# Patient Record
Sex: Female | Born: 1945
Health system: Southern US, Community
[De-identification: ages and names within clinical notes are randomized; demographics above are authoritative.]

## PROBLEM LIST (undated history)

## (undated) DIAGNOSIS — I10 Essential (primary) hypertension: Secondary | ICD-10-CM

## (undated) DIAGNOSIS — N186 End stage renal disease: Secondary | ICD-10-CM

## (undated) DIAGNOSIS — K219 Gastro-esophageal reflux disease without esophagitis: Secondary | ICD-10-CM

## (undated) DIAGNOSIS — T8859XA Other complications of anesthesia, initial encounter: Secondary | ICD-10-CM

## (undated) DIAGNOSIS — E7800 Pure hypercholesterolemia, unspecified: Secondary | ICD-10-CM

## (undated) DIAGNOSIS — I441 Atrioventricular block, second degree: Secondary | ICD-10-CM

## (undated) DIAGNOSIS — T4145XA Adverse effect of unspecified anesthetic, initial encounter: Secondary | ICD-10-CM

## (undated) DIAGNOSIS — E039 Hypothyroidism, unspecified: Secondary | ICD-10-CM

## (undated) DIAGNOSIS — Z950 Presence of cardiac pacemaker: Secondary | ICD-10-CM

## (undated) DIAGNOSIS — E119 Type 2 diabetes mellitus without complications: Secondary | ICD-10-CM

## (undated) DIAGNOSIS — R011 Cardiac murmur, unspecified: Secondary | ICD-10-CM

## (undated) DIAGNOSIS — M1990 Unspecified osteoarthritis, unspecified site: Secondary | ICD-10-CM

## (undated) DIAGNOSIS — D509 Iron deficiency anemia, unspecified: Secondary | ICD-10-CM

## (undated) DIAGNOSIS — G43909 Migraine, unspecified, not intractable, without status migrainosus: Secondary | ICD-10-CM

## (undated) DIAGNOSIS — N184 Chronic kidney disease, stage 4 (severe): Secondary | ICD-10-CM

## (undated) DIAGNOSIS — M109 Gout, unspecified: Secondary | ICD-10-CM

## (undated) DIAGNOSIS — Z45018 Encounter for adjustment and management of other part of cardiac pacemaker: Secondary | ICD-10-CM

## (undated) DIAGNOSIS — Z992 Dependence on renal dialysis: Secondary | ICD-10-CM

## (undated) HISTORY — PX: APPENDECTOMY: SHX54

## (undated) HISTORY — PX: KNEE ARTHROSCOPY: SHX127

## (undated) HISTORY — PX: TONSILLECTOMY: SUR1361

---
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
## 2016-07-16 DIAGNOSIS — M25531 Pain in right wrist: Secondary | ICD-10-CM | POA: Insufficient documentation

## 2016-07-16 DIAGNOSIS — M25562 Pain in left knee: Secondary | ICD-10-CM | POA: Insufficient documentation

## 2017-01-03 ENCOUNTER — Other Ambulatory Visit: Payer: Self-pay | Admitting: Internal Medicine

## 2017-01-03 DIAGNOSIS — N184 Chronic kidney disease, stage 4 (severe): Secondary | ICD-10-CM

## 2017-01-06 ENCOUNTER — Ambulatory Visit
Admission: RE | Admit: 2017-01-06 | Discharge: 2017-01-06 | Disposition: A | Discharge status: Home / Self Care | Payer: Medicare Other | Source: Ambulatory Visit | Attending: Internal Medicine | Admitting: Internal Medicine

## 2017-01-06 DIAGNOSIS — N184 Chronic kidney disease, stage 4 (severe): Secondary | ICD-10-CM

## 2017-05-28 ENCOUNTER — Other Ambulatory Visit (HOSPITAL_COMMUNITY): Payer: Self-pay | Admitting: *Deleted

## 2017-05-28 DIAGNOSIS — N184 Chronic kidney disease, stage 4 (severe): Secondary | ICD-10-CM

## 2017-05-29 ENCOUNTER — Ambulatory Visit (HOSPITAL_COMMUNITY)
Admission: RE | Admit: 2017-05-29 | Discharge: 2017-05-29 | Disposition: A | Discharge status: Home / Self Care | Payer: Medicare Other | Source: Ambulatory Visit | Attending: Nephrology | Admitting: Nephrology

## 2017-05-29 DIAGNOSIS — D631 Anemia in chronic kidney disease: Secondary | ICD-10-CM | POA: Diagnosis not present

## 2017-05-29 DIAGNOSIS — N184 Chronic kidney disease, stage 4 (severe): Secondary | ICD-10-CM | POA: Diagnosis not present

## 2017-05-29 DIAGNOSIS — Z5181 Encounter for therapeutic drug level monitoring: Secondary | ICD-10-CM | POA: Insufficient documentation

## 2017-05-29 DIAGNOSIS — Z79899 Other long term (current) drug therapy: Secondary | ICD-10-CM | POA: Insufficient documentation

## 2017-05-29 LAB — POCT HEMOGLOBIN-HEMACUE: Hemoglobin: 9.5 g/dL — ABNORMAL LOW (ref 12.0–15.0)

## 2017-05-29 MED ORDER — EPOETIN ALFA 20000 UNIT/ML IJ SOLN
INTRAMUSCULAR | Status: AC
  Administered 2017-05-29: 20000 [IU] via SUBCUTANEOUS
  Filled 2017-05-29: qty 1

## 2017-05-29 MED ORDER — EPOETIN ALFA 10000 UNIT/ML IJ SOLN
INTRAMUSCULAR | Status: AC
  Administered 2017-05-29: 10000 [IU] via SUBCUTANEOUS
  Filled 2017-05-29: qty 1

## 2017-05-29 MED ORDER — EPOETIN ALFA 10000 UNIT/ML IJ SOLN
30000.0000 [IU] | INTRAMUSCULAR | Status: DC
  Administered 2017-05-29: 10000 [IU] via SUBCUTANEOUS

## 2017-05-29 MED ORDER — FERUMOXYTOL INJECTION 510 MG/17 ML
510.0000 mg | INTRAVENOUS | Status: DC
  Administered 2017-05-29: 510 mg via INTRAVENOUS
  Filled 2017-05-29: qty 17

## 2017-05-29 NOTE — Discharge Instructions (Signed)
Ferumoxytol injection °What is this medicine? °FERUMOXYTOL is an iron complex. Iron is used to make healthy red blood cells, which carry oxygen and nutrients throughout the body. This medicine is used to treat iron deficiency anemia in people with chronic kidney disease. °This medicine may be used for other purposes; ask your health care provider or pharmacist if you have questions. °COMMON BRAND NAME(S): Feraheme °What should I tell my health care provider before I take this medicine? °They need to know if you have any of these conditions: °-anemia not caused by low iron levels °-high levels of iron in the blood °-magnetic resonance imaging (MRI) test scheduled °-an unusual or allergic reaction to iron, other medicines, foods, dyes, or preservatives °-pregnant or trying to get pregnant °-breast-feeding °How should I use this medicine? °This medicine is for injection into a vein. It is given by a health care professional in a hospital or clinic setting. °Talk to your pediatrician regarding the use of this medicine in children. Special care may be needed. °Overdosage: If you think you have taken too much of this medicine contact a poison control center or emergency room at once. °NOTE: This medicine is only for you. Do not share this medicine with others. °What if I miss a dose? °It is important not to miss your dose. Call your doctor or health care professional if you are unable to keep an appointment. °What may interact with this medicine? °This medicine may interact with the following medications: °-other iron products °This list may not describe all possible interactions. Give your health care provider a list of all the medicines, herbs, non-prescription drugs, or dietary supplements you use. Also tell them if you smoke, drink alcohol, or use illegal drugs. Some items may interact with your medicine. °What should I watch for while using this medicine? °Visit your doctor or healthcare professional regularly. Tell  your doctor or healthcare professional if your symptoms do not start to get better or if they get worse. You may need blood work done while you are taking this medicine. °You may need to follow a special diet. Talk to your doctor. Foods that contain iron include: whole grains/cereals, dried fruits, beans, or peas, leafy green vegetables, and organ meats (liver, kidney). °What side effects may I notice from receiving this medicine? °Side effects that you should report to your doctor or health care professional as soon as possible: °-allergic reactions like skin rash, itching or hives, swelling of the face, lips, or tongue °-breathing problems °-changes in blood pressure °-feeling faint or lightheaded, falls °-fever or chills °-flushing, sweating, or hot feelings °-swelling of the ankles or feet °Side effects that usually do not require medical attention (report to your doctor or health care professional if they continue or are bothersome): °-diarrhea °-headache °-nausea, vomiting °-stomach pain °This list may not describe all possible side effects. Call your doctor for medical advice about side effects. You may report side effects to FDA at 1-800-FDA-1088. °Where should I keep my medicine? °This drug is given in a hospital or clinic and will not be stored at home. °NOTE: This sheet is a summary. It may not cover all possible information. If you have questions about this medicine, talk to your doctor, pharmacist, or health care provider. °© 2018 [employer] (2015-12-21 12:41:49) ° ° °Epoetin Alfa injection °What is this medicine? °EPOETIN ALFA (e POE e tin AL fa) helps your body make more red blood cells. This medicine is used to treat anemia caused by chronic kidney   failure, cancer chemotherapy, or HIV-therapy. It may also be used before surgery if you have anemia. °This medicine may be used for other purposes; ask your health care provider or pharmacist if you have questions. °COMMON BRAND NAME(S):  Epogen, Procrit °What should I tell my health care provider before I take this medicine? °They need to know if you have any of these conditions: °-blood clotting disorders °-cancer patient not on chemotherapy °-cystic fibrosis °-heart disease, such as angina or heart failure °-hemoglobin level of 12 g/dL or greater °-high blood pressure °-low levels of folate, iron, or vitamin B12 °-seizures °-an unusual or allergic reaction to erythropoietin, albumin, benzyl alcohol, hamster proteins, other medicines, foods, dyes, or preservatives °-pregnant or trying to get pregnant °-breast-feeding °How should I use this medicine? °This medicine is for injection into a vein or under the skin. It is usually given by a health care professional in a hospital or clinic setting. °If you get this medicine at home, you will be taught how to prepare and give this medicine. Use exactly as directed. Take your medicine at regular intervals. Do not take your medicine more often than directed. °It is important that you put your used needles and syringes in a special sharps container. Do not put them in a trash can. If you do not have a sharps container, call your pharmacist or healthcare provider to get one. °A special MedGuide will be given to you by the pharmacist with each prescription and refill. Be sure to read this information carefully each time. °Talk to your pediatrician regarding the use of this medicine in children. While this drug may be prescribed for selected conditions, precautions do apply. °Overdosage: If you think you have taken too much of this medicine contact a poison control center or emergency room at once. °NOTE: This medicine is only for you. Do not share this medicine with others. °What if I miss a dose? °If you miss a dose, take it as soon as you can. If it is almost time for your next dose, take only that dose. Do not take double or extra doses. °What may interact with this medicine? °Do not take this medicine with  any of the following medications: °-darbepoetin alfa °This list may not describe all possible interactions. Give your health care provider a list of all the medicines, herbs, non-prescription drugs, or dietary supplements you use. Also tell them if you smoke, drink alcohol, or use illegal drugs. Some items may interact with your medicine. °What should I watch for while using this medicine? °Your condition will be monitored carefully while you are receiving this medicine. °You may need blood work done while you are taking this medicine. °What side effects may I notice from receiving this medicine? °Side effects that you should report to your doctor or health care professional as soon as possible: °-allergic reactions like skin rash, itching or hives, swelling of the face, lips, or tongue °-breathing problems °-changes in vision °-chest pain °-confusion, trouble speaking or understanding °-feeling faint or lightheaded, falls °-high blood pressure °-muscle aches or pains °-pain, swelling, warmth in the leg °-rapid weight gain °-severe headaches °-sudden numbness or weakness of the face, arm or leg °-trouble walking, dizziness, loss of balance or coordination °-seizures (convulsions) °-swelling of the ankles, feet, hands °-unusually weak or tired °Side effects that usually do not require medical attention (report to your doctor or health care professional if they continue or are bothersome): °-diarrhea °-fever, chills (flu-like symptoms) °-headaches °-nausea, vomiting °-redness, stinging,   or swelling at site where injected °This list may not describe all possible side effects. Call your doctor for medical advice about side effects. You may report side effects to FDA at 1-800-FDA-1088. °Where should I keep my medicine? °Keep out of the reach of children. °Store in a refrigerator between 2 and 8 degrees C (36 and 46 degrees F). Do not freeze or shake. Throw away any unused portion if using a single-dose vial. Multi-dose  vials can be kept in the refrigerator for up to 21 days after the initial dose. Throw away unused medicine. °NOTE: This sheet is a summary. It may not cover all possible information. If you have questions about this medicine, talk to your doctor, pharmacist, or health care provider. °© 2018 [employer] (2016-07-08 19:42:31) ° °

## 2017-06-05 ENCOUNTER — Encounter (HOSPITAL_COMMUNITY): Payer: Medicare Other

## 2017-06-25 ENCOUNTER — Other Ambulatory Visit (HOSPITAL_COMMUNITY): Payer: Self-pay | Admitting: *Deleted

## 2017-06-26 ENCOUNTER — Ambulatory Visit (HOSPITAL_COMMUNITY)
Admission: RE | Admit: 2017-06-26 | Discharge: 2017-06-26 | Disposition: A | Discharge status: Home / Self Care | Payer: Medicare Other | Source: Ambulatory Visit | Attending: Nephrology | Admitting: Nephrology

## 2017-06-26 DIAGNOSIS — D631 Anemia in chronic kidney disease: Secondary | ICD-10-CM | POA: Diagnosis not present

## 2017-06-26 DIAGNOSIS — N184 Chronic kidney disease, stage 4 (severe): Secondary | ICD-10-CM | POA: Diagnosis not present

## 2017-06-26 LAB — POCT HEMOGLOBIN-HEMACUE: HEMOGLOBIN: 10.7 g/dL — AB (ref 12.0–15.0)

## 2017-06-26 MED ORDER — SODIUM CHLORIDE 0.9 % IV SOLN
510.0000 mg | INTRAVENOUS | Status: AC
  Administered 2017-06-26: 510 mg via INTRAVENOUS
  Filled 2017-06-26: qty 17

## 2017-06-26 MED ORDER — EPOETIN ALFA 10000 UNIT/ML IJ SOLN: 30000.0000 [IU] | INTRAMUSCULAR | Status: DC

## 2017-06-26 MED ORDER — EPOETIN ALFA 10000 UNIT/ML IJ SOLN
INTRAMUSCULAR | Status: AC
  Administered 2017-06-26: 10000 [IU] via SUBCUTANEOUS
  Filled 2017-06-26: qty 1

## 2017-06-26 MED ORDER — EPOETIN ALFA 20000 UNIT/ML IJ SOLN
INTRAMUSCULAR | Status: AC
  Administered 2017-06-26: 20000 [IU] via SUBCUTANEOUS
  Filled 2017-06-26: qty 1

## 2017-07-24 ENCOUNTER — Encounter (HOSPITAL_COMMUNITY)
Admission: RE | Admit: 2017-07-24 | Discharge: 2017-07-24 | Disposition: A | Discharge status: Home / Self Care | Payer: Medicare Other | Source: Ambulatory Visit | Attending: Nephrology | Admitting: Nephrology

## 2017-07-24 DIAGNOSIS — D631 Anemia in chronic kidney disease: Secondary | ICD-10-CM | POA: Diagnosis present

## 2017-07-24 DIAGNOSIS — N189 Chronic kidney disease, unspecified: Secondary | ICD-10-CM | POA: Diagnosis not present

## 2017-07-24 LAB — IRON AND TIBC
Iron: 69 ug/dL (ref 28–170)
SATURATION RATIOS: 21 % (ref 10.4–31.8)
TIBC: 325 ug/dL (ref 250–450)
UIBC: 256 ug/dL

## 2017-07-24 LAB — FERRITIN: FERRITIN: 89 ng/mL (ref 11–307)

## 2017-07-24 LAB — POCT HEMOGLOBIN-HEMACUE: HEMOGLOBIN: 10.6 g/dL — AB (ref 12.0–15.0)

## 2017-07-24 MED ORDER — EPOETIN ALFA 40000 UNIT/ML IJ SOLN: 30000.0000 [IU] | Freq: Once | INTRAMUSCULAR | Status: DC

## 2017-07-24 MED ORDER — EPOETIN ALFA 20000 UNIT/ML IJ SOLN
INTRAMUSCULAR | Status: AC
  Administered 2017-07-24: 20000 [IU] via SUBCUTANEOUS
  Filled 2017-07-24: qty 1

## 2017-07-24 MED ORDER — EPOETIN ALFA 10000 UNIT/ML IJ SOLN
INTRAMUSCULAR | Status: AC
  Administered 2017-07-24: 10000 [IU] via SUBCUTANEOUS
  Filled 2017-07-24: qty 1

## 2017-08-20 ENCOUNTER — Other Ambulatory Visit (HOSPITAL_COMMUNITY): Payer: Self-pay | Admitting: *Deleted

## 2017-08-21 ENCOUNTER — Encounter (HOSPITAL_COMMUNITY)
Admission: RE | Admit: 2017-08-21 | Discharge: 2017-08-21 | Disposition: A | Discharge status: Home / Self Care | Payer: Medicare Other | Source: Ambulatory Visit | Attending: Nephrology | Admitting: Nephrology

## 2017-08-21 DIAGNOSIS — D631 Anemia in chronic kidney disease: Secondary | ICD-10-CM | POA: Diagnosis present

## 2017-08-21 DIAGNOSIS — N189 Chronic kidney disease, unspecified: Secondary | ICD-10-CM | POA: Insufficient documentation

## 2017-08-21 LAB — IRON AND TIBC
IRON: 75 ug/dL (ref 28–170)
Saturation Ratios: 22 % (ref 10.4–31.8)
TIBC: 343 ug/dL (ref 250–450)
UIBC: 268 ug/dL

## 2017-08-21 LAB — POCT HEMOGLOBIN-HEMACUE: HEMOGLOBIN: 12.3 g/dL (ref 12.0–15.0)

## 2017-08-21 LAB — FERRITIN: Ferritin: 58 ng/mL (ref 11–307)

## 2017-08-21 MED ORDER — EPOETIN ALFA 20000 UNIT/ML IJ SOLN: 30000.0000 [IU] | Freq: Once | INTRAMUSCULAR | Status: DC

## 2017-09-04 ENCOUNTER — Other Ambulatory Visit (HOSPITAL_COMMUNITY): Payer: Self-pay | Admitting: *Deleted

## 2017-09-05 ENCOUNTER — Encounter (HOSPITAL_COMMUNITY)
Admission: RE | Admit: 2017-09-05 | Discharge: 2017-09-05 | Disposition: A | Discharge status: Home / Self Care | Payer: Medicare Other | Source: Ambulatory Visit | Attending: Nephrology | Admitting: Nephrology

## 2017-09-05 DIAGNOSIS — D631 Anemia in chronic kidney disease: Secondary | ICD-10-CM | POA: Insufficient documentation

## 2017-09-05 DIAGNOSIS — N189 Chronic kidney disease, unspecified: Secondary | ICD-10-CM | POA: Insufficient documentation

## 2017-09-05 MED ORDER — EPOETIN ALFA 10000 UNIT/ML IJ SOLN
10000.0000 [IU] | INTRAMUSCULAR | Status: DC
  Administered 2017-09-05: 10000 [IU] via SUBCUTANEOUS

## 2017-09-05 MED ORDER — EPOETIN ALFA 10000 UNIT/ML IJ SOLN
INTRAMUSCULAR | Status: AC
  Filled 2017-09-05: qty 1

## 2017-09-09 LAB — POCT HEMOGLOBIN-HEMACUE: Hemoglobin: 11.6 g/dL — ABNORMAL LOW (ref 12.0–15.0)

## 2017-10-02 ENCOUNTER — Other Ambulatory Visit (HOSPITAL_COMMUNITY): Payer: Self-pay | Admitting: *Deleted

## 2017-10-02 DIAGNOSIS — N189 Chronic kidney disease, unspecified: Secondary | ICD-10-CM

## 2017-10-03 ENCOUNTER — Encounter (HOSPITAL_COMMUNITY)
Admission: RE | Admit: 2017-10-03 | Discharge: 2017-10-03 | Disposition: A | Discharge status: Home / Self Care | Payer: Medicare Other | Source: Ambulatory Visit | Attending: Nephrology | Admitting: Nephrology

## 2017-10-03 DIAGNOSIS — N189 Chronic kidney disease, unspecified: Secondary | ICD-10-CM | POA: Diagnosis not present

## 2017-10-03 DIAGNOSIS — D631 Anemia in chronic kidney disease: Secondary | ICD-10-CM | POA: Insufficient documentation

## 2017-10-03 LAB — POCT HEMOGLOBIN-HEMACUE: Hemoglobin: 10.7 g/dL — ABNORMAL LOW (ref 12.0–15.0)

## 2017-10-03 LAB — IRON AND TIBC
Iron: 58 ug/dL (ref 28–170)
Saturation Ratios: 18 % (ref 10.4–31.8)
TIBC: 325 ug/dL (ref 250–450)
UIBC: 267 ug/dL

## 2017-10-03 MED ORDER — EPOETIN ALFA 10000 UNIT/ML IJ SOLN
INTRAMUSCULAR | Status: AC
  Administered 2017-10-03: 10000 [IU]
  Filled 2017-10-03: qty 1

## 2017-10-03 MED ORDER — EPOETIN ALFA 20000 UNIT/ML IJ SOLN: 10000.0000 [IU] | Freq: Once | INTRAMUSCULAR | Status: DC

## 2017-10-30 ENCOUNTER — Other Ambulatory Visit (HOSPITAL_COMMUNITY): Payer: Self-pay | Admitting: *Deleted

## 2017-10-31 ENCOUNTER — Ambulatory Visit (HOSPITAL_COMMUNITY)
Admission: RE | Admit: 2017-10-31 | Discharge: 2017-10-31 | Disposition: A | Discharge status: Home / Self Care | Payer: Medicare Other | Source: Ambulatory Visit | Attending: Nephrology | Admitting: Nephrology

## 2017-10-31 DIAGNOSIS — D631 Anemia in chronic kidney disease: Secondary | ICD-10-CM | POA: Diagnosis not present

## 2017-10-31 DIAGNOSIS — N184 Chronic kidney disease, stage 4 (severe): Secondary | ICD-10-CM | POA: Insufficient documentation

## 2017-10-31 LAB — RENAL FUNCTION PANEL
Albumin: 3.5 g/dL (ref 3.5–5.0)
Anion gap: 10 (ref 5–15)
BUN: 49 mg/dL — AB (ref 6–20)
CALCIUM: 9.4 mg/dL (ref 8.9–10.3)
CO2: 27 mmol/L (ref 22–32)
CREATININE: 3.05 mg/dL — AB (ref 0.44–1.00)
Chloride: 99 mmol/L — ABNORMAL LOW (ref 101–111)
GFR calc non Af Amer: 14 mL/min — ABNORMAL LOW (ref >60)
GFR, EST AFRICAN AMERICAN: 16 mL/min — AB (ref >60)
GLUCOSE: 182 mg/dL — AB (ref 65–99)
Phosphorus: 4.2 mg/dL (ref 2.5–4.6)
Potassium: 3.7 mmol/L (ref 3.5–5.1)
SODIUM: 136 mmol/L (ref 135–145)

## 2017-10-31 LAB — POCT HEMOGLOBIN-HEMACUE: Hemoglobin: 10 g/dL — ABNORMAL LOW (ref 12.0–15.0)

## 2017-10-31 MED ORDER — EPOETIN ALFA 10000 UNIT/ML IJ SOLN
INTRAMUSCULAR | Status: AC
Start: 2017-10-31 — End: 2017-10-31
  Administered 2017-10-31: 10000 [IU] via SUBCUTANEOUS
  Filled 2017-10-31: qty 1

## 2017-10-31 MED ORDER — SODIUM CHLORIDE 0.9 % IV SOLN
510.0000 mg | Freq: Once | INTRAVENOUS | Status: AC
  Administered 2017-10-31: 510 mg via INTRAVENOUS
  Filled 2017-10-31: qty 17

## 2017-10-31 MED ORDER — EPOETIN ALFA 10000 UNIT/ML IJ SOLN
10000.0000 [IU] | Freq: Once | INTRAMUSCULAR | Status: AC
  Administered 2017-10-31: 10000 [IU] via SUBCUTANEOUS

## 2017-11-01 LAB — PTH, INTACT AND CALCIUM
CALCIUM TOTAL (PTH): 9.6 mg/dL (ref 8.7–10.3)
PTH: 58 pg/mL (ref 15–65)

## 2017-11-27 ENCOUNTER — Encounter (HOSPITAL_COMMUNITY): Payer: Medicare Other

## 2018-03-02 HISTORY — PX: PACEMAKER PLACEMENT: SHX43

## 2018-03-19 DIAGNOSIS — R0609 Other forms of dyspnea: Secondary | ICD-10-CM

## 2018-03-19 DIAGNOSIS — R0600 Dyspnea, unspecified: Secondary | ICD-10-CM | POA: Diagnosis present

## 2018-03-20 ENCOUNTER — Other Ambulatory Visit: Payer: Self-pay

## 2018-03-20 ENCOUNTER — Encounter (HOSPITAL_COMMUNITY): Payer: Self-pay

## 2018-03-20 ENCOUNTER — Ambulatory Visit (HOSPITAL_COMMUNITY): Payer: Medicare Other

## 2018-03-20 ENCOUNTER — Encounter (HOSPITAL_COMMUNITY)
Admission: RE | Disposition: A | Discharge status: Home / Self Care | Payer: Self-pay | Source: Ambulatory Visit | Attending: Cardiology

## 2018-03-20 ENCOUNTER — Ambulatory Visit (HOSPITAL_COMMUNITY)
Admission: RE | Disposition: A | Discharge status: Home / Self Care | Payer: Self-pay | Source: Ambulatory Visit | Attending: Cardiology

## 2018-03-20 ENCOUNTER — Ambulatory Visit (HOSPITAL_COMMUNITY)
Admission: RE | Admit: 2018-03-20 | Discharge: 2018-03-20 | Disposition: A | Discharge status: Home / Self Care | Payer: Medicare Other | Source: Ambulatory Visit | Attending: Cardiology | Admitting: Cardiology

## 2018-03-20 DIAGNOSIS — I080 Rheumatic disorders of both mitral and aortic valves: Secondary | ICD-10-CM | POA: Diagnosis not present

## 2018-03-20 DIAGNOSIS — E782 Mixed hyperlipidemia: Secondary | ICD-10-CM | POA: Diagnosis not present

## 2018-03-20 DIAGNOSIS — R0600 Dyspnea, unspecified: Secondary | ICD-10-CM | POA: Diagnosis present

## 2018-03-20 DIAGNOSIS — R0602 Shortness of breath: Secondary | ICD-10-CM | POA: Diagnosis not present

## 2018-03-20 DIAGNOSIS — E039 Hypothyroidism, unspecified: Secondary | ICD-10-CM | POA: Insufficient documentation

## 2018-03-20 DIAGNOSIS — M8580 Other specified disorders of bone density and structure, unspecified site: Secondary | ICD-10-CM | POA: Diagnosis not present

## 2018-03-20 DIAGNOSIS — Z7982 Long term (current) use of aspirin: Secondary | ICD-10-CM | POA: Insufficient documentation

## 2018-03-20 DIAGNOSIS — I442 Atrioventricular block, complete: Secondary | ICD-10-CM | POA: Insufficient documentation

## 2018-03-20 DIAGNOSIS — R0609 Other forms of dyspnea: Secondary | ICD-10-CM | POA: Diagnosis present

## 2018-03-20 DIAGNOSIS — E1122 Type 2 diabetes mellitus with diabetic chronic kidney disease: Secondary | ICD-10-CM | POA: Insufficient documentation

## 2018-03-20 DIAGNOSIS — I129 Hypertensive chronic kidney disease with stage 1 through stage 4 chronic kidney disease, or unspecified chronic kidney disease: Secondary | ICD-10-CM | POA: Insufficient documentation

## 2018-03-20 DIAGNOSIS — N184 Chronic kidney disease, stage 4 (severe): Secondary | ICD-10-CM | POA: Insufficient documentation

## 2018-03-20 DIAGNOSIS — I6523 Occlusion and stenosis of bilateral carotid arteries: Secondary | ICD-10-CM | POA: Insufficient documentation

## 2018-03-20 DIAGNOSIS — K219 Gastro-esophageal reflux disease without esophagitis: Secondary | ICD-10-CM | POA: Insufficient documentation

## 2018-03-20 DIAGNOSIS — D631 Anemia in chronic kidney disease: Secondary | ICD-10-CM | POA: Insufficient documentation

## 2018-03-20 DIAGNOSIS — M109 Gout, unspecified: Secondary | ICD-10-CM | POA: Diagnosis not present

## 2018-03-20 DIAGNOSIS — I350 Nonrheumatic aortic (valve) stenosis: Secondary | ICD-10-CM

## 2018-03-20 DIAGNOSIS — I441 Atrioventricular block, second degree: Secondary | ICD-10-CM | POA: Diagnosis present

## 2018-03-20 DIAGNOSIS — E1151 Type 2 diabetes mellitus with diabetic peripheral angiopathy without gangrene: Secondary | ICD-10-CM | POA: Insufficient documentation

## 2018-03-20 HISTORY — PX: RIGHT HEART CATH: CATH118263

## 2018-03-20 HISTORY — PX: TEE WITHOUT CARDIOVERSION: SHX5443

## 2018-03-20 HISTORY — DX: Gout, unspecified: M10.9

## 2018-03-20 HISTORY — DX: Essential (primary) hypertension: I10

## 2018-03-20 LAB — BASIC METABOLIC PANEL
Anion gap: 18 — ABNORMAL HIGH (ref 5–15)
BUN: 83 mg/dL — AB (ref 6–20)
CHLORIDE: 96 mmol/L — AB (ref 101–111)
CO2: 24 mmol/L (ref 22–32)
Calcium: 10.4 mg/dL — ABNORMAL HIGH (ref 8.9–10.3)
Creatinine, Ser: 4.19 mg/dL — ABNORMAL HIGH (ref 0.44–1.00)
GFR calc Af Amer: 11 mL/min — ABNORMAL LOW (ref >60)
GFR calc non Af Amer: 10 mL/min — ABNORMAL LOW (ref >60)
GLUCOSE: 160 mg/dL — AB (ref 65–99)
POTASSIUM: 3.3 mmol/L — AB (ref 3.5–5.1)
Sodium: 138 mmol/L (ref 135–145)

## 2018-03-20 LAB — CBC
HEMATOCRIT: 28.9 % — AB (ref 36.0–46.0)
Hemoglobin: 9.6 g/dL — ABNORMAL LOW (ref 12.0–15.0)
MCH: 29.7 pg (ref 26.0–34.0)
MCHC: 33.2 g/dL (ref 30.0–36.0)
MCV: 89.5 fL (ref 78.0–100.0)
Platelets: 178 10*3/uL (ref 150–400)
RBC: 3.23 MIL/uL — ABNORMAL LOW (ref 3.87–5.11)
RDW: 13.8 % (ref 11.5–15.5)
WBC: 9.1 10*3/uL (ref 4.0–10.5)

## 2018-03-20 LAB — POCT I-STAT 3, VENOUS BLOOD GAS (G3P V)
Acid-Base Excess: 4 mmol/L — ABNORMAL HIGH (ref 0.0–2.0)
Bicarbonate: 28.3 mmol/L — ABNORMAL HIGH (ref 20.0–28.0)
O2 Saturation: 70 %
PH VEN: 7.468 — AB (ref 7.250–7.430)
TCO2: 30 mmol/L (ref 22–32)
pCO2, Ven: 39.1 mmHg — ABNORMAL LOW (ref 44.0–60.0)
pO2, Ven: 35 mmHg (ref 32.0–45.0)

## 2018-03-20 SURGERY — RIGHT HEART CATH
Anesthesia: LOCAL

## 2018-03-20 SURGERY — ECHOCARDIOGRAM, TRANSESOPHAGEAL
Anesthesia: Moderate Sedation

## 2018-03-20 MED ORDER — SODIUM CHLORIDE 0.9 % IV SOLN: 250.0000 mL | INTRAVENOUS | Status: DC | PRN

## 2018-03-20 MED ORDER — SODIUM CHLORIDE 0.9 % IV SOLN: INTRAVENOUS | Status: DC

## 2018-03-20 MED ORDER — FENTANYL CITRATE (PF) 100 MCG/2ML IJ SOLN
INTRAMUSCULAR | Status: DC | PRN
  Administered 2018-03-20 (×2): 25 ug via INTRAVENOUS

## 2018-03-20 MED ORDER — SODIUM CHLORIDE 0.9 % WEIGHT BASED INFUSION: 1.0000 mL/kg/h | INTRAVENOUS | Status: DC

## 2018-03-20 MED ORDER — SODIUM CHLORIDE 0.9 % IV SOLN
INTRAVENOUS | Status: DC | PRN
  Administered 2018-03-20: 100 mL/h via INTRAVENOUS

## 2018-03-20 MED ORDER — LIDOCAINE HCL (PF) 1 % IJ SOLN
INTRAMUSCULAR | Status: DC | PRN
  Administered 2018-03-20: 15 mL
  Administered 2018-03-20: 2 mL

## 2018-03-20 MED ORDER — SODIUM CHLORIDE 0.9 % WEIGHT BASED INFUSION: 3.0000 mL/kg/h | INTRAVENOUS | Status: AC

## 2018-03-20 MED ORDER — SODIUM CHLORIDE 0.9% FLUSH: 3.0000 mL | INTRAVENOUS | Status: DC | PRN

## 2018-03-20 MED ORDER — FENTANYL CITRATE (PF) 100 MCG/2ML IJ SOLN
INTRAMUSCULAR | Status: AC
  Filled 2018-03-20: qty 2

## 2018-03-20 MED ORDER — MIDAZOLAM HCL 5 MG/ML IJ SOLN
INTRAMUSCULAR | Status: AC
  Filled 2018-03-20: qty 2

## 2018-03-20 MED ORDER — ONDANSETRON HCL 4 MG/2ML IJ SOLN
4.0000 mg | Freq: Four times a day (QID) | INTRAMUSCULAR | Status: DC | PRN
Start: 2018-03-20 — End: 2018-03-20

## 2018-03-20 MED ORDER — MIDAZOLAM HCL 10 MG/2ML IJ SOLN
INTRAMUSCULAR | Status: DC | PRN
  Administered 2018-03-20 (×2): 1 mg via INTRAVENOUS

## 2018-03-20 MED ORDER — SODIUM CHLORIDE 0.9% FLUSH: 3.0000 mL | Freq: Two times a day (BID) | INTRAVENOUS | Status: DC

## 2018-03-20 MED ORDER — HEPARIN (PORCINE) IN NACL 1000-0.9 UT/500ML-% IV SOLN
INTRAVENOUS | Status: AC
Start: 2018-03-20 — End: 2018-03-20
  Filled 2018-03-20: qty 500

## 2018-03-20 MED ORDER — LIDOCAINE HCL (PF) 1 % IJ SOLN
INTRAMUSCULAR | Status: AC
Start: 2018-03-20 — End: 2018-03-20
  Filled 2018-03-20: qty 30

## 2018-03-20 MED ORDER — ASPIRIN 81 MG PO CHEW: 81.0000 mg | CHEWABLE_TABLET | ORAL | Status: DC

## 2018-03-20 MED ORDER — BUTAMBEN-TETRACAINE-BENZOCAINE 2-2-14 % EX AERO
INHALATION_SPRAY | CUTANEOUS | Status: DC | PRN
  Administered 2018-03-20: 2 via TOPICAL

## 2018-03-20 SURGICAL SUPPLY — 11 items
CATH SWAN GANZ 7F STRAIGHT (CATHETERS) ×2 IMPLANT
COVER PRB 48X5XTLSCP FOLD TPE (BAG) ×1 IMPLANT
COVER PROBE 5X48 (BAG) ×1
GLIDESHEATH SLEND A-KIT 6F 22G (SHEATH) IMPLANT
GUIDEWIRE .025 260CM (WIRE) ×2 IMPLANT
KIT HEART LEFT (KITS) ×2 IMPLANT
KIT MICROPUNCTURE NIT STIFF (SHEATH) ×2 IMPLANT
PACK CARDIAC CATHETERIZATION (CUSTOM PROCEDURE TRAY) ×2 IMPLANT
SHEATH AVANTI 11CM 7FR (SHEATH) ×2 IMPLANT
SHEATH RAIN 4/5FR (SHEATH) IMPLANT
TRANSDUCER W/STOPCOCK (MISCELLANEOUS) ×2 IMPLANT

## 2018-03-20 NOTE — Progress Notes (Signed)
Dr Shanon Brow was paged// call returned. Needed verification on order for 4 hour bedrest. Per Dr Liliane Shi, he only wants 1 hour bedrest.

## 2018-03-20 NOTE — CV Procedure (Addendum)
TEE: Under moderate sedation, TEE was performed without complications:  LV: Hyperdynamic LV. Moderate LVH. Basal septal hypertrophy. Unable to calculate peak LVOT gradient due to aliasing.  RV: Normal LA: Normal. Left atrial appendage: Normal without thrombus. Normal function. Thickened interatrial septum. Inter atrial septum is intact without defect. Calcific coumadin ridge  RA: Normal  MV: Leaflet and annular calcification without significant stenosis or regurgitation TV: Normal Trace TR AV: Annular calcification. Leaflets pliable and open well. No significant AI. Vmax 4.04 m/sec, mean PG  33 mmHg. However, significant LVOT gradient due to thicken basal septum. No SAM, no subaortic membrane. AVA by continuity equation 1.7 cm2, by planimetry >2 cm2. PV: Normal. Trace PI.  Thoracic and ascending aorta: Normal without significant plaque or atheromatous changes.  Conscious sedation protocol was followed, I personally administered conscious sedation and monitored the patient. Patient received 2 milligrams of Versed and 50 mcg fentanyl . Patient tolerated the procedure well and there was no complication from conscious sedation. Time administered was 60 min and procedure ended at 1:05 PM.  Nigel Mormon, MD [facility] Pager: [phone] Office: [phone] If no answer Cell [phone]

## 2018-03-20 NOTE — H&P (Signed)
OFFICE VISIT NOTES COPIED TO EPIC FOR DOCUMENTATION  . History of Present Illness Gwinda Maine FNP-C; 03/19/2018 12:35 PM) Patient words: fu nuc, echo, carotid, gxt; last OV 02/20/18  **pt stated that she is having sob, weakness, have to sit down evey 2 min**.  The patient is a 72 year old female who presents for a follow-up for Bradycardia. 72 y/o Caucasian female with hypertension, CKD IV, type 2 DM, hyperlipidemia, hypothyroidism, anemia, osteopenia, venous insufficiency.  Patient was seen recetnly in our clinic for incidental finding of bradycardia and was found to have 2:1 AV block. She also reproted mild shortness of breath. She underwent lexiscan MPI which was normal with no ischemia/infarct. Exerxise treadmill stress test to evaluate for chroinotropic incomptency or any third degree AV block, it did not show any worsening of AV block but she was unable to reach target heart rate. She underwent echocardiogram on 03/03/2018 moderate LVH with hyperdynamic LV systolic function. LVEF was normal. Did have severe aortic stenosis; however, was not well visualized. Also moderate pulmonary hypertension. She now presents to discuss results.  Daughter in law is present at bedside. Reports for the last few weeks she has had significant increase in shortness of breath on exertion, fatigue, and weakness.      Problem List/Past Medical Georgeanna Harrison; 03/19/2018 10:24 AM) Gout (M10.9)  Hypercholesteremia (E78.00)  Seasonal allergies (J30.2)  GERD (gastroesophageal reflux disease) (K21.9)  Controlled type 2 diabetes mellitus without complication, without long-term current use of insulin (E11.9)  Bradycardia (R00.1)  EKG 02/12/2018: Sinus rhythm at 60 bpm with 2:1 AV block, no evidence of ischemia Laboratory examination (Z01.89)  04/15/2017: Creatinine 2.6, EGFR 18/21, potassium 4.3, CMP normal. RBC 3.3, hemoglobin 9.6, 29.7, CBC otherwise normal. Cholesterol 176, triglycerides 257, HDL 31,  LDL 94. TSH 1.8. Vitamin D 40.7. Benign essential hypertension (I10)  Mixed hyperlipidemia (E78.2)  Stage 4 chronic kidney disease due to type 2 diabetes mellitus (E11.22)  Shortness of breath (R06.02)  Lexiscan myoview stress test 02/13/2018: 1. Pharmacologic stress testing was performed with intravenous administration of .4 mg of Lexiscan over a 10-15 seconds infusion. Resting hypertension 180/84 mmHg. Exercise capacity not assessed. Stress symptoms included dyspnea, dizziness. Stress EKG is non diagnostic for ischemia as it is a pharmacologic stress. 2. The overall quality of the study is excellent. There is no evidence of abnormal lung activity. Stress and rest SPECT images demonstrate homogeneous tracer distribution throughout the myocardium. Gated SPECT imaging reveals normal myocardial thickening and wall motion. The left ventricular ejection fraction was normal (67%). 3. Low risk study. Malaise and fatigue (R53.81, R53.83)  Bilateral carotid bruits (R09.89)  Carotid artery duplex 03/10/2018: Minimal stenosis in the right internal carotid artery (1-15%). Stenosis in the left internal carotid artery (16-49%). Antegrade right vertebral artery flow. Antegrade left vertebral artery flow. Follow up in one year is appropriate if clinically indicated. Intermittent second degree atrioventricular block (I44.1)  2:1 AV block Exercise Treadmill Stress Test 02/20/2018: Indication: Bradycardia, 2:1 AV block The patient exercised on stop and go Modified-Bruce protocol for 4:31 min. Patient achieved 1.91 METS and reached HR 95 bpm, which is 56% of maximum age-predicted HR. Stress test terminated due to fatigue. Exercise capacity was below average for age. HR Response to Exercise: Appropriate. BP Response to Exercise: Resting hypertension- exaggerated response. Chest Pain: none. Arrhythmias: Inermittent 2:1 AV block, frequent PAC's and PVC's, episodes of atrial and ventricular bigeminy. While interpretation is  significantly limited due to baseline artifact, no evidence of high grade AV block.  Stress EKG is nondiagnostic for ischemia due to inabiliy to reach target heart rate and significant baseline artifact. Overall Impression: Poor exercise capacity. Inadequate assessment for inotropic competense, limited due to poor functional capacity. Recoomendation: Consider Holter/event monitor if high clinical suspicion for high grade AV block . Continue primary/secondary prevention. Murmur (R01.1)  Echocardiogram 03/10/2018: Left ventricle cavity is normal in size. Moderate concentric hypertrophy of the left ventricle. Hyperdynamic LV systolic function. Intraventricular pressure gradient suspected. Visual EF is 65-70%. Diastolic evaluation limited due to mitral annual calcification and mitral regurgitation. Left atrial cavity is mildly dilated. Right ventricle cavity is normal in size. Mild concentric hypertrophy of the right ventricle. Normal right ventricular function. Elevated LVOT/AoV velocity and gradients. Vmax 5 m.sec, mean PG 50 mmHg. AVA by continuity equation is 1 cm2, suggesting severe aortic stenosis. However, aortic valve is not well visualized. LVOT obstruction or SAM contributing to elevated velocity and gradients cannot be excluded. Recommend further imaging such as transesophageal echocardiography. Moderate calcification of the mitral valve annulus. Moderately restricted mitral valve leaflets without significant stenosis. Mild (Grade I) mitral regurgitation. Mild to moderate tricuspid regurgitation. Moderate pulmonary hypertension. Estimated pulmonary artery systolic pressure 45 mmHg.  Allergies Georgeanna Harrison; 04-04-18 10:24 AM) No Known Drug Allergies [02/12/2018]:  Family History Georgeanna Harrison; Apr 04, 2018 10:24 AM) Mother  Deceased. at age 35 from Rectal Cancer; Hx of CVA Father  Deceased. at age 18 from an MI, DM, Diabetic Coma (1st one) Brother 2  Older; Heart Murmur  Social History  Georgeanna Harrison; 04/04/18 10:24 AM) Current tobacco use  Never smoker. Non Drinker/No Alcohol Use  Marital status  Widowed. Living Situation  Lives alone. Number of Children  3.  Past Surgical History Georgeanna Harrison; 2018/04/04 10:24 AM) Appendectomy [8032]: Tonsillectomy [1966]: Arthroscopic Knee Surgery - Both [1995]:  Medication History Gwinda Maine, FNP-C; 04/04/2018 12:45 PM) AmLODIPine Besylate (10MG Tablet, 1 (one) Tablet Oral once daily, Taken starting 02/20/2018) Active. HydrALAZINE HCl (50MG Tablet, 1 (one) Tablet Tablet Oral three times daily, Taken starting 02/12/2018) Active. Furosemide (80MG Tablet, 1 Oral two times daily) Active. Allopurinol (300MG Tablet, 1 Oral daily) Active. Simvastatin (40MG Tablet, 1 Oral daily) Active. Levothyroxine Sodium (75MCG Tablet, 1 Oral daily) Active. Zantac 150 Maximum Strength (150MG Tablet, 1 Oral daily) Active. Aspirin Adult Low Strength (81MG Tablet DR, 1 Oral daily) Active. Vitamin B-6 (100MG Tablet, 1 Oral daily) Active. Vitamin B-12 (5000MCG Tablet Disint, 1 Oral daily) Active. ZyrTEC (10MG Tablet, 1 Oral daily) Active. Vitamin D3 (2000UNIT Tablet, 1 Oral daily) Active. Calcium (600MG Tablet, 1 Oral daily) Active. Biotin (10MG Tablet, 1 Oral daily) Active. Fluticasone Propionate (50MCG/ACT Suspension, 2 sprays each nostril Nasal daily) Active. Medications Reconciled (Verbally; Pt brought list)  Diagnostic Studies History Georgeanna Harrison; Apr 04, 2018 8:58 AM) Treadmill stress test  Exercise Treadmill Stress Test 02/20/2018:  Indication: Bradycardia, 2:1 AV block  The patient exercised on stop and go Modified-Bruce protocol for 4:31 min. Patient achieved 1.91 METS and reached HR 95 bpm, which is 56% of maximum age-predicted HR. Stress test terminated due to fatigue.  Exercise capacity was below average for age. HR Response to Exercise: Appropriate.  BP Response to Exercise: Resting hypertension-  exaggerated response. Chest Pain: none. Arrhythmias: Inermittent 2:1 AV block, frequent PAC's and PVC's, episodes of atrial and ventricular bigeminy. While interpretation is significantly limited due to baseline artifact, no evidence of high grade AV block. Stress EKG is nondiagnostic for ischemia due to inabiliy to reach target heart rate and significant baseline artifact.  Overall Impression: Poor  exercise capacity. Inadequate assessment for inotropic competense, limited due to poor functional capacity.  Recoomendation: Consider Holter/event monitor if high clinical suspicion for high grade AV block . Continue primary/secondary prevention. None [03/19/2018]: (Marked as Inactive) Vascular Ultrasound  Carotid artery duplex 03/10/2018: Minimal stenosis in the right internal carotid artery (1-15%). Stenosis in the left internal carotid artery (16-49%). Antegrade right vertebral artery flow. Antegrade left vertebral artery flow. Follow up in one year is appropriate if clinically indicated. Echocardiogram  Echocardiogram 03/10/2018: Left ventricle cavity is normal in size. Moderate concentric hypertrophy of the left ventricle. Hyperdynamic LV systolic function. Intraventricular pressure gradient suspected. Visual EF is 65-70%. Diastolic evaluation limited due to mitral annual calcification and mitral regurgitation. Left atrial cavity is mildly dilated. Right ventricle cavity is normal in size. Mild concentric hypertrophy of the right ventricle. Normal right ventricular function. Elevated LVOT/AoV velocity and gradients. Vmax 5 m.sec, mean PG 50 mmHg. AVA by continuity equation is 1 cm2, suggesting severe aortic stenosis. However, aortic valve is not well visualized. LVOT obstruction or SAM contributing to elevated velocity and gradients cannot be excluded. Recommend further imaging such as transesophageal echocardiography. Moderate calcification of the mitral valve annulus. Moderately restricted  mitral valve leaflets without significant stenosis. Mild (Grade I) mitral regurgitation. Mild to moderate tricuspid regurgitation. Moderate pulmonary hypertension. Estimated pulmonary artery systolic pressure 45 mmHg. Nuclear stress test  Lexiscan myoview stress test 02/13/2018: 1. Pharmacologic stress testing was performed with intravenous administration of .4 mg of Lexiscan over a 10-15 seconds infusion. Resting hypertension 180/84 mmHg. Exercise capacity not assessed. Stress symptoms included dyspnea, dizziness. Stress EKG is non diagnostic for ischemia as it is a pharmacologic stress. 2. The overall quality of the study is excellent. There is no evidence of abnormal lung activity. Stress and rest SPECT images demonstrate homogeneous tracer distribution throughout the myocardium. Gated SPECT imaging reveals normal myocardial thickening and wall motion. The left ventricular ejection fraction was normal (67%). 3. Low risk study.    Review of Systems Gwinda Maine FNP-C; 03/19/2018 12:36 PM) General Present- Fatigue (recent). Not Present- Appetite Loss and Weight Gain. Respiratory Not Present- Chronic Cough and Wakes up from Sleep Wheezing or Short of Breath. Cardiovascular Present- Difficulty Breathing On Exertion (worsening) and Edema. Not Present- Chest Pain and Difficulty Breathing Lying Down. Gastrointestinal Not Present- Black, Tarry Stool and Difficulty Swallowing. Musculoskeletal Not Present- Decreased Range of Motion and Muscle Atrophy. Neurological Not Present- Attention Deficit, Dizziness and Syncope. Psychiatric Not Present- Personality Changes and Suicidal Ideation. Endocrine Not Present- Cold Intolerance and Heat Intolerance. Hematology Not Present- Abnormal Bleeding. All other systems negative  Vitals Georgeanna Harrison; 03/19/2018 10:36 AM) 03/19/2018 10:29 AM Weight: 208.5 lb Height: 65in Body Surface Area: 2.01 m Body Mass Index: 34.7 kg/m  Pulse: 46 (Regular)   P.OX: 98% (Room air) BP: 154/52 (Sitting, Left Arm, Standard)       Physical Exam Gwinda Maine FNP-C; 03/19/2018 12:37 PM) General Mental Status-Alert. General Appearance-Cooperative and Appears stated age. Build & Nutrition-Moderately built.  Head and Neck Thyroid Gland Characteristics - normal size and consistency and no palpable nodules.  Chest and Lung Exam Chest and lung exam reveals -quiet, even and easy respiratory effort with no use of accessory muscles, non-tender and on auscultation, normal breath sounds, no adventitious sounds.  Cardiovascular Cardiovascular examination reveals -carotid auscultation reveals no bruits, abdominal aorta auscultation reveals no bruits and no prominent pulsation, femoral artery auscultation bilaterally reveals normal pulses, no bruits, no thrills and normal pedal pulses bilaterally. Auscultation Heart  Sounds - S2 Diminished intensity. Murmurs & Other Heart Sounds: Murmur - Location - Aortic Area. Grade - II/VI. Character - Crescendo.  Abdomen Palpation/Percussion Normal exam - Non Tender and No hepatosplenomegaly.  Peripheral Vascular Carotid arteries - Bilateral-Carotid bruit.  Neurologic Neurologic evaluation reveals -alert and oriented x 3 with no impairment of recent or remote memory. Motor-Grossly intact without any focal deficits.  Musculoskeletal Global Assessment Left Lower Extremity - no deformities, masses or tenderness, no known fractures. Right Lower Extremity - no deformities, masses or tenderness, no known fractures.    Assessment & Plan Gwinda Maine FNP-C; 03/19/2018 12:44 PM) Aortic stenosis, severe (I35.0) Story: Echocardiogram 03/10/2018: Left ventricle cavity is normal in size. Moderate concentric hypertrophy of the left ventricle. Hyperdynamic LV systolic function. Intraventricular pressure gradient suspected. Visual EF is 65-70%. Diastolic evaluation limited due to mitral annual  calcification and mitral regurgitation. Left atrial cavity is mildly dilated. Right ventricle cavity is normal in size. Mild concentric hypertrophy of the right ventricle. Normal right ventricular function. Elevated LVOT/AoV velocity and gradients. Vmax 5 m.sec, mean PG 50 mmHg. AVA by continuity equation is 1 cm2, suggesting severe aortic stenosis. However, aortic valve is not well visualized. LVOT obstruction or SAM contributing to elevated velocity and gradients cannot be excluded. Recommend further imaging such as transesophageal echocardiography. Moderate calcification of the mitral valve annulus. Moderately restricted mitral valve leaflets without significant stenosis. Mild (Grade I) mitral regurgitation. Mild to moderate tricuspid regurgitation. Moderate pulmonary hypertension. Estimated pulmonary artery systolic pressure 45 mmHg. Current Plans METABOLIC PANEL, BASIC (40814) CBC & PLATELETS (AUTO) (48185) PT (PROTHROMBIN TIME) (63149) Bradycardia (R00.1) Story: EKG 02/12/2018: Sinus rhythm at 60 bpm with 2:1 AV block, no evidence of ischemia Benign essential hypertension (I10) Shortness of breath (R06.02) Story: Lexiscan myoview stress test 02/13/2018: 1. Pharmacologic stress testing was performed with intravenous administration of .4 mg of Lexiscan over a 10-15 seconds infusion. Resting hypertension 180/84 mmHg. Exercise capacity not assessed. Stress symptoms included dyspnea, dizziness. Stress EKG is non diagnostic for ischemia as it is a pharmacologic stress. 2. The overall quality of the study is excellent. There is no evidence of abnormal lung activity. Stress and rest SPECT images demonstrate homogeneous tracer distribution throughout the myocardium. Gated SPECT imaging reveals normal myocardial thickening and wall motion. The left ventricular ejection fraction was normal (67%). 3. Low risk study. Malaise and fatigue (R53.81) Bilateral carotid bruits (R09.89) Story: Carotid artery  duplex 03/10/2018: Minimal stenosis in the right internal carotid artery (1-15%). Stenosis in the left internal carotid artery (16-49%). Antegrade right vertebral artery flow. Antegrade left vertebral artery flow. Follow up in one year is appropriate if clinically indicated. Laboratory examination (Z01.89) Story: 04/15/2017: Creatinine 2.6, EGFR 18/21, potassium 4.3, CMP normal. RBC 3.3, hemoglobin 9.6, 29.7, CBC otherwise normal. Cholesterol 176, triglycerides 257, HDL 31, LDL 94. TSH 1.8. Vitamin D 40.7.  Note:Recommendations:  72 y/o Caucasian female with hypertension, CKD IV, type 2 DM, hyperlipidemia, hypothyroidism, anemia, osteopenia, venous insufficiency, being evaluated for 2:1 AV block and exertional dyspnea.  I have discussed test results with the patient and her daughter-in-law. She's had recent worsening in symptoms and it is difficult to differentiate related to her AV block or aortic stenosis and may also be multifactorial. She will need pacemaker at some point to she was unable to achieve max heart rate by stress test. No evidence of ischemia by nuclear stress testing. Would recommend further evaluation of her aortic valve first as if she needs aortic valve procedure, pacemaker may also  be placed at that time. We'll scheduled for TEE to better evaluate her aortic valve as it was not well visualized by transthoracic echo. I will go ahead and schedule for right and left heart catheterization for diagnostic incase her aortic valve is severely stenosed she will need coronary evaluation prior to undergoing procedure. We'll try to have procedures arranged for tomorrow. Schedule for TEE to better evaluate the structural abnormality. I have explained risks, benefits,alternatives to TEE, including but not limited to rare esophageal perforation, bleeding, aspiration pneumonia. Schedule for cardiac catheterization, and possible angioplasty. We discussed regarding risks and benefits. Patient  wants to proceed. Understands <1-2% risk of death, stroke, MI, urgent CABG, bleeding, infection, renal failure but not limited to these. We will see her back after the procedures for reevaluation and further coordination of care.  Has mild carotid disease. Will continue to follow annually.  *I have discussed this case with Dr. Einar Gip and he personally examined the patient and participated in formulating the plan.*  CC: [name], MD CC: Erling Cruz, MD  Signed by Gwinda Maine, FNP-C (03/19/2018 12:45 PM)

## 2018-03-20 NOTE — Discharge Instructions (Signed)
Venogram, Care After °This sheet gives you information about how to care for yourself after your procedure. Your health care provider may also give you more specific instructions. If you have problems or questions, contact your health care provider. °What can I expect after the procedure? °After the procedure, it is common to have: °· Bruising or mild discomfort in the area where the IV was inserted (insertion site). ° °Follow these instructions at home: °Eating and drinking °· Follow instructions from your health care provider about eating or drinking restrictions. °· Drink a lot of fluids for the first several days after the procedure, as directed by your health care provider. This helps to wash (flush) the contrast out of your body. Examples of healthy fluids include water or low-calorie drinks. °General instructions °· Check your IV insertion area every day for signs of infection. Check for: °? Redness, swelling, or pain. °? Fluid or blood. °? Warmth. °? Pus or a bad smell. °· Take over-the-counter and prescription medicines only as told by your health care provider. °· Rest and return to your normal activities as told by your health care provider. Ask your health care provider what activities are safe for you. °· Do not drive for 24 hours if you were given a medicine to help you relax (sedative), or until your health care provider approves. °· Keep all follow-up visits as told by your health care provider. This is important. °Contact a health care provider if: °· Your skin becomes itchy or you develop a rash or hives. °· You have a fever that does not get better with medicine. °· You feel nauseous. °· You vomit. °· You have redness, swelling, or pain around the insertion site. °· You have fluid or blood coming from the insertion site. °· Your insertion area feels warm to the touch. °· You have pus or a bad smell coming from the insertion site. °Get help right away if: °· You have difficulty breathing or  shortness of breath. °· You develop chest pain. °· You faint. °· You feel very dizzy. °These symptoms may represent a serious problem that is an emergency. Do not wait to see if the symptoms will go away. Get medical help right away. Call your local emergency services (911 in the [place] Do not drive yourself to the hospital. °Summary °· After your procedure, it is common to have bruising or mild discomfort in the area where the IV was inserted. °· You should check your IV insertion area every day for signs of infection. °· Take over-the-counter and prescription medicines only as told by your health care provider. °· You should drink a lot of fluids for the first several days after the procedure to help flush the contrast from your body. °This information is not intended to replace advice given to you by your health care provider. Make sure you discuss any questions you have with your health care provider. °Document Released: 09/08/2013 Document Revised: 10/12/2016 Document Reviewed: 10/12/2016 °Elsevier Interactive Patient Education © 2017 [employer] ° °

## 2018-03-20 NOTE — Interval H&P Note (Signed)
History and Physical Interval Note:  03/20/2018 12:08 PM  Erika Fry  has presented today for surgery, with the diagnosis of AORTIC STENOSIS  The various methods of treatment have been discussed with the patient and family. After consideration of risks, benefits and other options for treatment, the patient has consented to  Procedure(s): TRANSESOPHAGEAL ECHOCARDIOGRAM (TEE) (N/A) as a surgical intervention .  The patient's history has been reviewed, patient examined, no change in status, stable for surgery.  I have reviewed the patient's chart and labs.  Questions were answered to the patient's satisfaction.     [place]

## 2018-03-20 NOTE — Interval H&P Note (Signed)
History and Physical Interval Note:  03/20/2018 12:06 PM  Erika Fry  has presented today for surgery, with the diagnosis of aortic stenosis  The various methods of treatment have been discussed with the patient and family. After consideration of risks, benefits and other options for treatment, the patient has consented to  Procedure(s): RIGHT/LEFT HEART CATH AND CORONARY ANGIOGRAPHY (N/A) as a surgical intervention .  The patient's history has been reviewed, patient examined, no change in status, stable for surgery.  I have reviewed the patient's chart and labs.  Questions were answered to the patient's satisfaction.     [place]

## 2018-03-20 NOTE — Progress Notes (Addendum)
Site area: Right groin a  7 french venous sheath was removed by Moishe Spice RN  Site Prior to Removal:  Level 0  Pressure Applied For 20 MINUTES    Bedrest Beginning at 1530p  Manual:   Yes.    Patient Status During Pull:  stable  Post Pull Groin Site:  Level 0  Post Pull Instructions Given:  Yes.    Post Pull Pulses Present:  Yes.    Dressing Applied:  Yes.    Comments:  VS remain stable

## 2018-03-22 ENCOUNTER — Encounter (HOSPITAL_COMMUNITY): Payer: Self-pay | Admitting: Cardiology

## 2018-03-23 ENCOUNTER — Encounter (HOSPITAL_COMMUNITY): Payer: Self-pay | Admitting: Cardiology

## 2018-03-23 MED FILL — Heparin Sod (Porcine)-NaCl IV Soln 1000 Unit/500ML-0.9%: INTRAVENOUS | Qty: 500 | Status: AC

## 2018-03-25 ENCOUNTER — Encounter (HOSPITAL_COMMUNITY)
Admission: RE | Disposition: A | Discharge status: Home / Self Care | Payer: Self-pay | Source: Ambulatory Visit | Attending: Internal Medicine

## 2018-03-25 ENCOUNTER — Ambulatory Visit (HOSPITAL_COMMUNITY)
Admission: RE | Admit: 2018-03-25 | Discharge: 2018-03-26 | Disposition: A | Discharge status: Home / Self Care | Payer: Medicare Other | Source: Ambulatory Visit | Attending: Internal Medicine | Admitting: Internal Medicine

## 2018-03-25 ENCOUNTER — Other Ambulatory Visit: Payer: Self-pay

## 2018-03-25 ENCOUNTER — Encounter (HOSPITAL_COMMUNITY): Payer: Self-pay | Admitting: Surgery

## 2018-03-25 DIAGNOSIS — E1122 Type 2 diabetes mellitus with diabetic chronic kidney disease: Secondary | ICD-10-CM | POA: Insufficient documentation

## 2018-03-25 DIAGNOSIS — N189 Chronic kidney disease, unspecified: Secondary | ICD-10-CM | POA: Insufficient documentation

## 2018-03-25 DIAGNOSIS — Z7982 Long term (current) use of aspirin: Secondary | ICD-10-CM | POA: Insufficient documentation

## 2018-03-25 DIAGNOSIS — I130 Hypertensive heart and chronic kidney disease with heart failure and stage 1 through stage 4 chronic kidney disease, or unspecified chronic kidney disease: Secondary | ICD-10-CM | POA: Diagnosis not present

## 2018-03-25 DIAGNOSIS — I441 Atrioventricular block, second degree: Secondary | ICD-10-CM | POA: Diagnosis not present

## 2018-03-25 DIAGNOSIS — Z885 Allergy status to narcotic agent status: Secondary | ICD-10-CM | POA: Insufficient documentation

## 2018-03-25 DIAGNOSIS — I5031 Acute diastolic (congestive) heart failure: Secondary | ICD-10-CM | POA: Insufficient documentation

## 2018-03-25 DIAGNOSIS — Z79899 Other long term (current) drug therapy: Secondary | ICD-10-CM | POA: Diagnosis not present

## 2018-03-25 DIAGNOSIS — Z950 Presence of cardiac pacemaker: Secondary | ICD-10-CM

## 2018-03-25 HISTORY — DX: Gastro-esophageal reflux disease without esophagitis: K21.9

## 2018-03-25 HISTORY — PX: INSERT / REPLACE / REMOVE PACEMAKER: SUR710

## 2018-03-25 HISTORY — DX: Iron deficiency anemia, unspecified: D50.9

## 2018-03-25 HISTORY — DX: Chronic kidney disease, stage 4 (severe): N18.4

## 2018-03-25 HISTORY — DX: Type 2 diabetes mellitus without complications: E11.9

## 2018-03-25 HISTORY — DX: Pure hypercholesterolemia, unspecified: E78.00

## 2018-03-25 HISTORY — DX: Unspecified osteoarthritis, unspecified site: M19.90

## 2018-03-25 HISTORY — DX: Migraine, unspecified, not intractable, without status migrainosus: G43.909

## 2018-03-25 HISTORY — DX: Atrioventricular block, second degree: I44.1

## 2018-03-25 HISTORY — DX: Cardiac murmur, unspecified: R01.1

## 2018-03-25 HISTORY — DX: Hypothyroidism, unspecified: E03.9

## 2018-03-25 HISTORY — PX: PACEMAKER IMPLANT: EP1218

## 2018-03-25 HISTORY — DX: Presence of cardiac pacemaker: Z95.0

## 2018-03-25 LAB — BASIC METABOLIC PANEL
ANION GAP: 15 (ref 5–15)
BUN: 79 mg/dL — ABNORMAL HIGH (ref 6–20)
CHLORIDE: 98 mmol/L — AB (ref 101–111)
CO2: 23 mmol/L (ref 22–32)
Calcium: 9.9 mg/dL (ref 8.9–10.3)
Creatinine, Ser: 3.85 mg/dL — ABNORMAL HIGH (ref 0.44–1.00)
GFR calc Af Amer: 12 mL/min — ABNORMAL LOW (ref >60)
GFR, EST NON AFRICAN AMERICAN: 11 mL/min — AB (ref >60)
Glucose, Bld: 158 mg/dL — ABNORMAL HIGH (ref 65–99)
Potassium: 3.3 mmol/L — ABNORMAL LOW (ref 3.5–5.1)
SODIUM: 136 mmol/L (ref 135–145)

## 2018-03-25 LAB — SURGICAL PCR SCREEN
MRSA, PCR: NEGATIVE
Staphylococcus aureus: POSITIVE — AB

## 2018-03-25 LAB — GLUCOSE, CAPILLARY: GLUCOSE-CAPILLARY: 162 mg/dL — AB (ref 65–99)

## 2018-03-25 SURGERY — PACEMAKER IMPLANT

## 2018-03-25 MED ORDER — ACETAMINOPHEN 325 MG PO TABS
325.0000 mg | ORAL_TABLET | ORAL | Status: DC | PRN
  Administered 2018-03-26: 650 mg via ORAL
  Filled 2018-03-25: qty 2

## 2018-03-25 MED ORDER — SODIUM CHLORIDE 0.9 % IV SOLN
INTRAVENOUS | Status: AC
  Filled 2018-03-25: qty 2

## 2018-03-25 MED ORDER — CEFAZOLIN SODIUM-DEXTROSE 1-4 GM/50ML-% IV SOLN
1.0000 g | Freq: Four times a day (QID) | INTRAVENOUS | Status: DC
  Filled 2018-03-25 (×2): qty 50

## 2018-03-25 MED ORDER — CEFAZOLIN SODIUM-DEXTROSE 2-4 GM/100ML-% IV SOLN
INTRAVENOUS | Status: AC
  Filled 2018-03-25: qty 100

## 2018-03-25 MED ORDER — FLUTICASONE PROPIONATE 50 MCG/ACT NA SUSP
2.0000 | Freq: Every day | NASAL | Status: DC | PRN
  Filled 2018-03-25: qty 16

## 2018-03-25 MED ORDER — LIDOCAINE HCL (PF) 1 % IJ SOLN
INTRAMUSCULAR | Status: AC
  Filled 2018-03-25: qty 60

## 2018-03-25 MED ORDER — FOLIC ACID 1 MG PO TABS
1.0000 mg | ORAL_TABLET | Freq: Every day | ORAL | Status: DC
  Administered 2018-03-25 – 2018-03-26 (×2): 1 mg via ORAL
  Filled 2018-03-25 (×2): qty 1

## 2018-03-25 MED ORDER — SODIUM CHLORIDE 0.9 % IV SOLN
INTRAVENOUS | Status: DC | PRN
  Administered 2018-03-25 (×2)

## 2018-03-25 MED ORDER — ONDANSETRON HCL 4 MG/2ML IJ SOLN: 4.0000 mg | Freq: Four times a day (QID) | INTRAMUSCULAR | Status: DC | PRN

## 2018-03-25 MED ORDER — ALLOPURINOL 300 MG PO TABS
300.0000 mg | ORAL_TABLET | Freq: Every day | ORAL | Status: DC
  Administered 2018-03-26: 300 mg via ORAL
  Filled 2018-03-25: qty 1

## 2018-03-25 MED ORDER — SODIUM CHLORIDE 0.9 % IV SOLN: 80.0000 mg | INTRAVENOUS | Status: DC

## 2018-03-25 MED ORDER — CEFAZOLIN SODIUM-DEXTROSE 2-3 GM-%(50ML) IV SOLR
INTRAVENOUS | Status: DC | PRN
  Administered 2018-03-25: 2 g via INTRAVENOUS

## 2018-03-25 MED ORDER — HYDRALAZINE HCL 50 MG PO TABS
50.0000 mg | ORAL_TABLET | Freq: Three times a day (TID) | ORAL | Status: DC
  Administered 2018-03-25 – 2018-03-26 (×2): 50 mg via ORAL
  Filled 2018-03-25 (×2): qty 1

## 2018-03-25 MED ORDER — CEFAZOLIN SODIUM-DEXTROSE 2-4 GM/100ML-% IV SOLN: 2.0000 g | INTRAVENOUS | Status: DC

## 2018-03-25 MED ORDER — FENTANYL CITRATE (PF) 100 MCG/2ML IJ SOLN
INTRAMUSCULAR | Status: AC
  Filled 2018-03-25: qty 2

## 2018-03-25 MED ORDER — HEPARIN (PORCINE) IN NACL 2-0.9 UNITS/ML
INTRAMUSCULAR | Status: AC | PRN
  Administered 2018-03-25: 500 mL

## 2018-03-25 MED ORDER — FUROSEMIDE 80 MG PO TABS
80.0000 mg | ORAL_TABLET | Freq: Two times a day (BID) | ORAL | Status: DC
  Administered 2018-03-25 – 2018-03-26 (×2): 80 mg via ORAL
  Filled 2018-03-25 (×2): qty 1

## 2018-03-25 MED ORDER — HEPARIN (PORCINE) IN NACL 1000-0.9 UT/500ML-% IV SOLN
INTRAVENOUS | Status: AC
  Filled 2018-03-25: qty 500

## 2018-03-25 MED ORDER — SODIUM CHLORIDE 0.9 % IV SOLN
INTRAVENOUS | Status: DC
  Administered 2018-03-25: 13:00:00 via INTRAVENOUS

## 2018-03-25 MED ORDER — FENTANYL CITRATE (PF) 100 MCG/2ML IJ SOLN
INTRAMUSCULAR | Status: DC | PRN
  Administered 2018-03-25: 25 ug via INTRAVENOUS
  Administered 2018-03-25: 12.5 ug via INTRAVENOUS

## 2018-03-25 MED ORDER — MUPIROCIN 2 % EX OINT
TOPICAL_OINTMENT | CUTANEOUS | Status: AC
  Administered 2018-03-25: 1
  Filled 2018-03-25: qty 22

## 2018-03-25 MED ORDER — CHLORHEXIDINE GLUCONATE 4 % EX LIQD: 60.0000 mL | Freq: Once | CUTANEOUS | Status: DC

## 2018-03-25 MED ORDER — MIDAZOLAM HCL 5 MG/5ML IJ SOLN
INTRAMUSCULAR | Status: AC
  Filled 2018-03-25: qty 5

## 2018-03-25 MED ORDER — LIDOCAINE HCL (PF) 1 % IJ SOLN
INTRAMUSCULAR | Status: DC | PRN
  Administered 2018-03-25: 45 mL

## 2018-03-25 MED ORDER — AMLODIPINE BESYLATE 10 MG PO TABS
10.0000 mg | ORAL_TABLET | Freq: Every day | ORAL | Status: DC
  Administered 2018-03-26: 10 mg via ORAL
  Filled 2018-03-25: qty 1

## 2018-03-25 MED ORDER — MIDAZOLAM HCL 5 MG/5ML IJ SOLN
INTRAMUSCULAR | Status: DC | PRN
  Administered 2018-03-25 (×3): 1 mg via INTRAVENOUS

## 2018-03-25 MED ORDER — LEVOTHYROXINE SODIUM 75 MCG PO TABS
75.0000 ug | ORAL_TABLET | Freq: Every day | ORAL | Status: DC
  Administered 2018-03-26: 75 ug via ORAL
  Filled 2018-03-25: qty 1

## 2018-03-25 MED ORDER — ASPIRIN EC 81 MG PO TBEC
81.0000 mg | DELAYED_RELEASE_TABLET | Freq: Every day | ORAL | Status: DC
  Administered 2018-03-26: 81 mg via ORAL
  Filled 2018-03-25: qty 1

## 2018-03-25 MED ORDER — CEFAZOLIN SODIUM-DEXTROSE 1-4 GM/50ML-% IV SOLN
1.0000 g | Freq: Two times a day (BID) | INTRAVENOUS | Status: AC
  Administered 2018-03-25 – 2018-03-26 (×2): 1 g via INTRAVENOUS
  Filled 2018-03-25 (×3): qty 50

## 2018-03-25 SURGICAL SUPPLY — 12 items
CABLE SURGICAL S-101-97-12 (CABLE) ×2 IMPLANT
CATH RIGHTSITE C315HIS02 (CATHETERS) ×2 IMPLANT
IPG PACE AZUR XT DR MRI W1DR01 (Pacemaker) ×1 IMPLANT
LEAD CAPSURE NOVUS 45CM (Lead) ×2 IMPLANT
LEAD SELECT SECURE 3830 383069 (Lead) ×1 IMPLANT
PACE AZURE XT DR MRI W1DR01 (Pacemaker) ×2 IMPLANT
PAD DEFIB LIFELINK (PAD) ×2 IMPLANT
SELECT SECURE 3830 383069 (Lead) ×2 IMPLANT
SHEATH CLASSIC 7F (SHEATH) ×4 IMPLANT
SLITTER 6232ADJ (MISCELLANEOUS) ×2 IMPLANT
TRAY PACEMAKER INSERTION (PACKS) ×2 IMPLANT
WIRE HI TORQ VERSACORE-J 145CM (WIRE) ×2 IMPLANT

## 2018-03-25 NOTE — Progress Notes (Signed)
PHARMACY NOTE:  ANTIMICROBIAL RENAL DOSAGE ADJUSTMENT  Current antimicrobial regimen includes a mismatch between antimicrobial dosage and estimated renal function.  As per policy approved by the Pharmacy & Therapeutics and Medical Executive Committees, the antimicrobial dosage will be adjusted accordingly.  Current antimicrobial dosage:  Cefazolin 1 gm IV q6hrs x 3 doses  Indication:   surgical prophylaxis post-pacemaker placement  Renal Function:  Estimated Creatinine Clearance: 14.7 mL/min (A) (by C-G formula based on SCr of 3.85 mg/dL (H)). []      On intermittent HD, scheduled: []      On CRRT    Antimicrobial dosage has been changed to:  Cefazolin 1gm IV q12h x 2 doses  Additional comments:   Thank you for allowing pharmacy to be a part of this patient's care.  Arty Baumgartner, [facility]  Pager: [phone] 03/25/2018 6:53 PM

## 2018-03-25 NOTE — Consult Note (Addendum)
ELECTROPHYSIOLOGY CONSULT NOTE    Patient ID: [name] MRN: [id number], DOB/AGE: 1946-11-05 72 y.o.  Admit date: 03/25/2018 Date of Consult: 03/25/2018  Primary Physician: [name], MD Primary Cardiologist: Pat[place]ardan Electrophysiologist: Lovena Le (new this admission)  Patient Profile: Erika Fry is a 72 y.o. female with a history of hypertension, diabetes, CKD who is being seen today for the evaluation of heart block at the request of Dr Virgina Jock.  HPI:  JALEENA Fry is a 72 y.o. female with the above past medical history.  She was found to have incidental bradycardia with Dr Dagmar Hait a few months ago. She was referred to Dr Einar Gip for evaluation and has had persistent bradycardia. She had a nuclear stress test and after that developed symptoms of fatigue and shortness of breath with exertion.  She has had progressive symptoms for the last couple of weeks. She has also had lightheadedness but no syncope. She has chronic LE edema.   TEE 03/20/18 demonstrated EF 65-70%, no RWMA.    Past Medical History:  Diagnosis Date  . Chronic kidney disease    stg 4  . Diabetes mellitus without complication (HCC)    diet controlled  . Gout   . Hypertension      Surgical History:  Past Surgical History:  Procedure Laterality Date  . RIGHT HEART CATH N/A 03/20/2018   Procedure: RIGHT HEART CATH;  Surgeon: Nigel Mormon, MD;  Location: [facility];  Service: Cardiovascular;  Laterality: N/A;  . TEE WITHOUT CARDIOVERSION N/A 03/20/2018   Procedure: TRANSESOPHAGEAL ECHOCARDIOGRAM (TEE);  Surgeon: Nigel Mormon, MD;  Location: [facility];  Service: Cardiovascular;  Laterality: N/A;     Medications Prior to Admission  Medication Sig Dispense Refill Last Dose  . allopurinol (ZYLOPRIM) 300 MG tablet Take 300 mg by mouth daily.    03/20/2018 at Unknown time  . amLODipine (NORVASC) 10 MG tablet Take 10 mg by mouth daily.   03/20/2018 at Unknown time  .  aspirin EC 81 MG tablet Take 81 mg by mouth daily.   03/20/2018 at Unknown time  . cetirizine (ZYRTEC) 10 MG tablet Chew 10 mg by mouth daily.   03/19/2018 at Unknown time  . fluticasone (FLONASE) 50 MCG/ACT nasal spray Place 2 sprays into both nostrils daily as needed for allergies or rhinitis.   03/20/2018 at Unknown time  . folic acid (FOLVITE) 1 MG tablet Take 1 mg by mouth daily.   03/20/2018 at Unknown time  . furosemide (LASIX) 80 MG tablet Take 80 mg by mouth 2 (two) times daily.    03/20/2018 at Unknown time  . hydrALAZINE (APRESOLINE) 50 MG tablet Take 50 mg by mouth 3 (three) times daily.   03/20/2018 at Unknown time  . levothyroxine (SYNTHROID, LEVOTHROID) 75 MCG tablet Take 75 mcg by mouth daily before breakfast.   03/20/2018 at Unknown time  . Multiple Vitamin (MULTI-VITAMIN PO) Take 1 tablet by mouth daily.   03/19/2018 at Unknown time  . ranitidine (ZANTAC) 150 MG tablet Take 150 mg by mouth daily.    03/20/2018 at Unknown time  . simvastatin (ZOCOR) 40 MG tablet Take 40 mg by mouth daily.    03/20/2018 at Unknown time    Inpatient Medications:   Allergies:  Allergies  Allergen Reactions  . Codeine Other (See Comments)    Increases Pain and couldn't sleep    Social History   Socioeconomic History  . Marital status: Widowed    Spouse name: Not on file  .  Number of children: Not on file  . Years of education: Not on file  . Highest education level: Not on file  Occupational History  . Not on file  Social Needs  . Financial resource strain: Not on file  . Food insecurity:    Worry: Not on file    Inability: Not on file  . Transportation needs:    Medical: Not on file    Non-medical: Not on file  Tobacco Use  . Smoking status: Never Smoker  . Smokeless tobacco: Never Used  Substance and Sexual Activity  . Alcohol use: Not on file  . Drug use: Not on file  . Sexual activity: Not on file  Lifestyle  . Physical activity:    Days per week: Not on file    Minutes per  session: Not on file  . Stress: Not on file  Relationships  . Social connections:    Talks on phone: Not on file    Gets together: Not on file    Attends religious service: Not on file    Active member of club or organization: Not on file    Attends meetings of clubs or organizations: Not on file    Relationship status: Not on file  . Intimate partner violence:    Fear of current or ex partner: Not on file    Emotionally abused: Not on file    Physically abused: Not on file    Forced sexual activity: Not on file  Other Topics Concern  . Not on file  Social History Narrative  . Not on file     Family History: no early CAD   Review of Systems: All other systems reviewed and are otherwise negative except as noted above.  Physical Exam: Vitals:   03/25/18 1120  BP: (!) 180/94  Pulse: (!) 42  Resp: 18  Temp: 98.6 F (37 C)  TempSrc: Oral  SpO2: 100%  Weight: 205 lb (93 kg)  Height: 5\' 5"  (1.651 m)    GEN- The patient is elderly appearing, alert and oriented x 3 today.   HEENT: normocephalic, atraumatic; sclera clear, conjunctiva pink; hearing intact; oropharynx clear; neck supple Lungs- Clear to ausculation bilaterally, normal work of breathing.  No wheezes, rales, rhonchi Heart- Bradycardic regular rate and rhythm  GI- soft, non-tender, non-distended, bowel sounds present Extremities- no clubbing, cyanosis, or edema  MS- no significant deformity or atrophy Skin- warm and dry, no rash or lesion Psych- euthymic mood, full affect Neuro- strength and sensation are intact  Labs:   Lab Results  Component Value Date   WBC 9.1 03/20/2018   HGB 9.6 (L) 03/20/2018   HCT 28.9 (L) 03/20/2018   MCV 89.5 03/20/2018   PLT 178 03/20/2018    Recent Labs  Lab 03/20/18 1121  NA 138  K 3.3*  CL 96*  CO2 24  BUN 83*  CREATININE 4.19*  CALCIUM 10.4*  GLUCOSE 160*      Radiology/Studies: No results found.  EKG:2:1 heart block, V rate 39 (personally  reviewed)  Assessment/Plan: 1.  2:1 heart block The patient has symptomatic heart block with no reversible causes identified She meets criteria for PPM implantation Risks, benefits reviewed with patient who wishes to proceed. Will plan for later today  2.  HTN Will adjust meds after PPM implanted  3.  Acute diastolic heart failure -see below    Signed, Chanetta Marshall, NP 03/25/2018 11:54 AM   EP attending  Patient seen and examined.  Agree with  the findings as noted above.  The patient is a very pleasant 72 year old woman who developed symptomatic 2-1 heart block.  She is undergone extensive evaluation and workup.  She presents today for additional evaluation and to consider insertion of a permanent pacemaker.  The patient has no reversible causes for her heart block.  Her ventricular rate is typically in the 35-40 range.  Her sinus rate is twice this.  I discussed the treatment options in detail.  The risks, goals, benefits, and expectations permanent dual-chamber pacemaker insertion have been reviewed and she wishes to proceed.  Cristopher Peru, MD

## 2018-03-26 ENCOUNTER — Encounter (HOSPITAL_COMMUNITY): Payer: Self-pay | Admitting: Internal Medicine

## 2018-03-26 ENCOUNTER — Ambulatory Visit (HOSPITAL_COMMUNITY): Payer: Medicare Other

## 2018-03-26 DIAGNOSIS — I5031 Acute diastolic (congestive) heart failure: Secondary | ICD-10-CM | POA: Diagnosis not present

## 2018-03-26 DIAGNOSIS — N189 Chronic kidney disease, unspecified: Secondary | ICD-10-CM | POA: Diagnosis not present

## 2018-03-26 DIAGNOSIS — I130 Hypertensive heart and chronic kidney disease with heart failure and stage 1 through stage 4 chronic kidney disease, or unspecified chronic kidney disease: Secondary | ICD-10-CM | POA: Diagnosis not present

## 2018-03-26 DIAGNOSIS — I441 Atrioventricular block, second degree: Secondary | ICD-10-CM | POA: Diagnosis not present

## 2018-03-26 NOTE — Progress Notes (Signed)
Discharge instructions reviewed with pt. Pt verbalizes understanding and states she has no questions. Pt belongings with pt. Pt is not in distress. Pt's daughter-in-law is driving her home. Pt discharged via wheelchair.

## 2018-03-26 NOTE — Discharge Instructions (Signed)
° ° °  Supplemental Discharge Instructions for  Pacemaker/Defibrillator Patients  Activity No heavy lifting or vigorous activity with your left/right arm for 6 to 8 weeks.  Do not raise your left/right arm above your head for one week.  Gradually raise your affected arm as drawn below.           __        03/29/18                       03/30/18                     03/31/18                 04/01/18  NO DRIVING for 1 week    ; you may begin driving on   0/7/37  .  WOUND CARE - Keep the wound area clean and dry.  Do not get this area wet for one week. No showers for one week; you may shower on   04/01/18  . - The tape/steri-strips on your wound will fall off; do not pull them off.  No bandage is needed on the site.  DO  NOT apply any creams, oils, or ointments to the wound area. - If you notice any drainage or discharge from the wound, any swelling or bruising at the site, or you develop a fever > 101? F after you are discharged home, call the office at once.  Special Instructions - You are still able to use cellular telephones; use the ear opposite the side where you have your pacemaker/defibrillator.  Avoid carrying your cellular phone near your device. - When traveling through airports, show security personnel your identification card to avoid being screened in the metal detectors.  Ask the security personnel to use the hand wand. - Avoid arc welding equipment, MRI testing (magnetic resonance imaging), TENS units (transcutaneous nerve stimulators).  Call the office for questions about other devices. - Avoid electrical appliances that are in poor condition or are not properly grounded. - Microwave ovens are safe to be near or to operate.

## 2018-03-26 NOTE — Discharge Summary (Addendum)
ELECTROPHYSIOLOGY PROCEDURE DISCHARGE SUMMARY    Patient ID: Erika Fry,  MRN: [id number], DOB/AGE: November 25, 1946 72 y.o.  Admit date: 03/25/2018 Discharge date: 03/26/2018  Primary Care Physician: [name], MD Primary Cardiologist: Einar Gip Electrophysiologist: Lovena Le  Primary Discharge Diagnosis:  Symptomatic 2:1 heart block status post pacemaker implantation this admission  Secondary Discharge Diagnosis:  1.  HTN 2.  Diabetes 3.  CKD  Allergies  Allergen Reactions  . Codeine Other (See Comments)    Increases Pain and couldn't sleep     Procedures This Admission:  1.  Implantation of a MDT dual chamber PPM on 03/25/18 by Dr Lovena Le.  The patient received a MDT model number Azure PPM with model number 5076 right atrial lead and 3830 right ventricular lead. There were no immediate post procedure complications. 2.  CXR on 03/26/18 demonstrated no pneumothorax status post device implantation.   Brief HPI: Erika Fry is a 72 y.o. female was referred to electrophysiology for consideration of PPM implantation.  Past medical history includes 2:1 heart block, hypertension, CKD, diabetes.  The patient has had symptomatic bradycardia without reversible causes identified.  Risks, benefits, and alternatives to PPM implantation were reviewed with the patient who wished to proceed.   Hospital Course:  The patient was admitted and underwent implantation of a MDT dual chamber PPM with details as outlined above.  She  was monitored on telemetry overnight which demonstrated sinus rhythm with V pacing.  Left chest was without hematoma or ecchymosis.  The device was interrogated and found to be functioning normally.  CXR was obtained and demonstrated no pneumothorax status post device implantation.  Wound care, arm mobility, and restrictions were reviewed with the patient.  The patient was examined and considered stable for discharge to home.    Physical Exam: Vitals:   03/25/18 1826 03/25/18 2051 03/26/18 0043 03/26/18 0600  BP: (!) 144/81 129/69 (!) 156/82 (!) 143/75  Pulse: 78 78 87 78  Resp: 20 18 18 18   Temp: 98.7 F (37.1 C) 98.5 F (36.9 C) 98.6 F (37 C) 98.4 F (36.9 C)  TempSrc: Oral Oral Oral Oral  SpO2: 97% 97% 98% 97%  Weight: 205 lb 0.4 oz (93 kg)   203 lb 1.6 oz (92.1 kg)  Height: 5\' 5"  (1.651 m)       GEN- The patient is well appearing, alert and oriented x 3 today.   HEENT: normocephalic, atraumatic; sclera clear, conjunctiva pink; hearing intact; oropharynx clear; neck supple  Lungs- Clear to ausculation bilaterally, normal work of breathing.  No wheezes, rales, rhonchi Heart- Regular rate and rhythm (paced) GI- soft, non-tender, non-distended, bowel sounds present  Extremities- no clubbing, cyanosis, or edema  MS- no significant deformity or atrophy Skin- warm and dry, no rash or lesion, left chest without hematoma/ecchymosis Psych- euthymic mood, full affect Neuro- strength and sensation are intact   Labs:   Lab Results  Component Value Date   WBC 9.1 03/20/2018   HGB 9.6 (L) 03/20/2018   HCT 28.9 (L) 03/20/2018   MCV 89.5 03/20/2018   PLT 178 03/20/2018    Recent Labs  Lab 03/25/18 1227  NA 136  K 3.3*  CL 98*  CO2 23  BUN 79*  CREATININE 3.85*  CALCIUM 9.9  GLUCOSE 158*    Discharge Medications:  Allergies as of 03/26/2018      Reactions   Codeine Other (See Comments)   Increases Pain and couldn't sleep      Medication List  TAKE these medications   allopurinol 300 MG tablet Commonly known as:  ZYLOPRIM Take 300 mg by mouth daily.   amLODipine 10 MG tablet Commonly known as:  NORVASC Take 10 mg by mouth daily.   aspirin EC 81 MG tablet Take 81 mg by mouth daily.   cetirizine 10 MG tablet Commonly known as:  ZYRTEC Chew 10 mg by mouth daily.   fluticasone 50 MCG/ACT nasal spray Commonly known as:  FLONASE Place 2 sprays into both nostrils daily as needed for allergies or rhinitis.     folic acid 1 MG tablet Commonly known as:  FOLVITE Take 1 mg by mouth daily.   furosemide 80 MG tablet Commonly known as:  LASIX Take 80 mg by mouth 2 (two) times daily.   hydrALAZINE 50 MG tablet Commonly known as:  APRESOLINE Take 50 mg by mouth 3 (three) times daily.   levothyroxine 75 MCG tablet Commonly known as:  SYNTHROID, LEVOTHROID Take 75 mcg by mouth daily before breakfast.   MULTI-VITAMIN PO Take 1 tablet by mouth daily.   ranitidine 150 MG tablet Commonly known as:  ZANTAC Take 150 mg by mouth daily.   simvastatin 40 MG tablet Commonly known as:  ZOCOR Take 40 mg by mouth daily.       Disposition:  Discharge Instructions    Diet - low sodium heart healthy   Complete by:  As directed    Increase activity slowly   Complete by:  As directed      Follow-up Information    [place] [facility] Follow up on 04/07/2018.   Specialty:  Cardiology Why:  at [facility] H information: [address], [place] [phone]          Duration of Discharge Encounter: Greater than 30 minutes including physician time.  Signed, Chanetta Marshall, NP 03/26/2018 7:18 AM   EP attending  Patient seen and examined.  Agree with the findings as noted above.  The patient is doing well status post permanent pacemaker insertion for symptomatic Mobitz 2 second-degree AV block.  Interrogation of her pacemaker under my direction demonstrates normal device function.  Her chest x-ray is stable with no pneumothorax.  She will be discharged home.  Follow-up is with Dr. Einar Gip as per his recommendation.  I am available as needed.  Our device clinic will see her for incisional follow-up in approximately 10 days as noted above.  Cristopher Peru, MD

## 2018-04-07 ENCOUNTER — Ambulatory Visit (INDEPENDENT_AMBULATORY_CARE_PROVIDER_SITE_OTHER): Payer: Medicare Other | Admitting: *Deleted

## 2018-04-07 DIAGNOSIS — I441 Atrioventricular block, second degree: Secondary | ICD-10-CM

## 2018-04-07 DIAGNOSIS — Z950 Presence of cardiac pacemaker: Secondary | ICD-10-CM

## 2018-04-07 LAB — CUP PACEART INCLINIC DEVICE CHECK
Brady Statistic AP VP Percent: 0.98 %
Brady Statistic AP VS Percent: 0 %
Brady Statistic AS VP Percent: 98.8 %
Brady Statistic AS VS Percent: 0.22 %
Brady Statistic RA Percent Paced: 1 %
Brady Statistic RV Percent Paced: 99.78 %
Date Time Interrogation Session: 20190507104712
Implantable Lead Location: 753860
Implantable Lead Model: 3830
Implantable Lead Model: 5076
Lead Channel Impedance Value: 285 Ohm
Lead Channel Impedance Value: 399 Ohm
Lead Channel Impedance Value: 418 Ohm
Lead Channel Pacing Threshold Amplitude: 0.5 V
Lead Channel Pacing Threshold Pulse Width: 0.4 ms
Lead Channel Pacing Threshold Pulse Width: 1 ms
Lead Channel Sensing Intrinsic Amplitude: 17 mV
Lead Channel Sensing Intrinsic Amplitude: 4.125 mV
Lead Channel Setting Pacing Amplitude: 3.5 V
Lead Channel Setting Pacing Pulse Width: 1 ms
MDC IDC LEAD IMPLANT DT: 20190424
MDC IDC LEAD IMPLANT DT: 20190424
MDC IDC LEAD LOCATION: 753859
MDC IDC MSMT BATTERY REMAINING LONGEVITY: 69 mo
MDC IDC MSMT BATTERY VOLTAGE: 3.19 V
MDC IDC MSMT LEADCHNL RA PACING THRESHOLD AMPLITUDE: 0.75 V
MDC IDC MSMT LEADCHNL RV IMPEDANCE VALUE: 323 Ohm
MDC IDC PG IMPLANT DT: 20190424
MDC IDC SET LEADCHNL RA PACING AMPLITUDE: 3.5 V
MDC IDC SET LEADCHNL RV SENSING SENSITIVITY: 0.9 mV

## 2018-04-07 NOTE — Progress Notes (Signed)
Wound check appointment. Steri-strips removed. Wound without redness or edema. Incision edges approximated, wound well healed. Normal device function. Thresholds, sensing, and impedances consistent with implant measurements. RV (His) capture appears septal until LOC. Device programmed at 3.5V with auto capture programmed on (RA only, RV/His changed to monitor only) for extra safety margin until 3 month visit. Histogram distribution appropriate for patient and level of activity. No mode switches or high ventricular rates noted. Patient educated about wound care, arm mobility, lifting restrictions. ROV with [facility] on 06/30/18, [facility] to be followed by Dr. Einar Gip.

## 2018-04-29 ENCOUNTER — Emergency Department (HOSPITAL_COMMUNITY): Payer: Medicare Other

## 2018-04-29 ENCOUNTER — Inpatient Hospital Stay (HOSPITAL_COMMUNITY)
Admission: EM | Admit: 2018-04-29 | Discharge: 2018-05-05 | DRG: 551 | Disposition: A | Discharge status: Skilled Nursing Facility | Payer: Medicare Other | Attending: Internal Medicine | Admitting: Internal Medicine

## 2018-04-29 ENCOUNTER — Encounter (HOSPITAL_COMMUNITY): Payer: Self-pay

## 2018-04-29 DIAGNOSIS — M549 Dorsalgia, unspecified: Secondary | ICD-10-CM | POA: Diagnosis present

## 2018-04-29 DIAGNOSIS — G92 Toxic encephalopathy: Secondary | ICD-10-CM | POA: Diagnosis not present

## 2018-04-29 DIAGNOSIS — E039 Hypothyroidism, unspecified: Secondary | ICD-10-CM | POA: Diagnosis present

## 2018-04-29 DIAGNOSIS — N189 Chronic kidney disease, unspecified: Secondary | ICD-10-CM

## 2018-04-29 DIAGNOSIS — I10 Essential (primary) hypertension: Secondary | ICD-10-CM | POA: Diagnosis not present

## 2018-04-29 DIAGNOSIS — E1165 Type 2 diabetes mellitus with hyperglycemia: Secondary | ICD-10-CM | POA: Diagnosis present

## 2018-04-29 DIAGNOSIS — Z950 Presence of cardiac pacemaker: Secondary | ICD-10-CM

## 2018-04-29 DIAGNOSIS — N390 Urinary tract infection, site not specified: Secondary | ICD-10-CM | POA: Diagnosis present

## 2018-04-29 DIAGNOSIS — M5441 Lumbago with sciatica, right side: Principal | ICD-10-CM | POA: Diagnosis present

## 2018-04-29 DIAGNOSIS — W19XXXA Unspecified fall, initial encounter: Secondary | ICD-10-CM

## 2018-04-29 DIAGNOSIS — N3000 Acute cystitis without hematuria: Secondary | ICD-10-CM | POA: Diagnosis present

## 2018-04-29 DIAGNOSIS — D696 Thrombocytopenia, unspecified: Secondary | ICD-10-CM | POA: Diagnosis present

## 2018-04-29 DIAGNOSIS — E7800 Pure hypercholesterolemia, unspecified: Secondary | ICD-10-CM | POA: Diagnosis present

## 2018-04-29 DIAGNOSIS — Z8249 Family history of ischemic heart disease and other diseases of the circulatory system: Secondary | ICD-10-CM

## 2018-04-29 DIAGNOSIS — D509 Iron deficiency anemia, unspecified: Secondary | ICD-10-CM | POA: Diagnosis present

## 2018-04-29 DIAGNOSIS — E876 Hypokalemia: Secondary | ICD-10-CM | POA: Diagnosis present

## 2018-04-29 DIAGNOSIS — E1122 Type 2 diabetes mellitus with diabetic chronic kidney disease: Secondary | ICD-10-CM | POA: Diagnosis present

## 2018-04-29 DIAGNOSIS — I350 Nonrheumatic aortic (valve) stenosis: Secondary | ICD-10-CM | POA: Diagnosis present

## 2018-04-29 DIAGNOSIS — Z23 Encounter for immunization: Secondary | ICD-10-CM

## 2018-04-29 DIAGNOSIS — Z7982 Long term (current) use of aspirin: Secondary | ICD-10-CM

## 2018-04-29 DIAGNOSIS — W010XXA Fall on same level from slipping, tripping and stumbling without subsequent striking against object, initial encounter: Secondary | ICD-10-CM | POA: Diagnosis present

## 2018-04-29 DIAGNOSIS — I959 Hypotension, unspecified: Secondary | ICD-10-CM | POA: Diagnosis not present

## 2018-04-29 DIAGNOSIS — Z79899 Other long term (current) drug therapy: Secondary | ICD-10-CM

## 2018-04-29 DIAGNOSIS — K219 Gastro-esophageal reflux disease without esophagitis: Secondary | ICD-10-CM | POA: Diagnosis not present

## 2018-04-29 DIAGNOSIS — B3749 Other urogenital candidiasis: Secondary | ICD-10-CM | POA: Diagnosis present

## 2018-04-29 DIAGNOSIS — N184 Chronic kidney disease, stage 4 (severe): Secondary | ICD-10-CM | POA: Diagnosis present

## 2018-04-29 DIAGNOSIS — Z885 Allergy status to narcotic agent status: Secondary | ICD-10-CM

## 2018-04-29 DIAGNOSIS — E119 Type 2 diabetes mellitus without complications: Secondary | ICD-10-CM | POA: Diagnosis not present

## 2018-04-29 DIAGNOSIS — B9620 Unspecified Escherichia coli [E. coli] as the cause of diseases classified elsewhere: Secondary | ICD-10-CM | POA: Diagnosis present

## 2018-04-29 DIAGNOSIS — Z6833 Body mass index (BMI) 33.0-33.9, adult: Secondary | ICD-10-CM

## 2018-04-29 DIAGNOSIS — E785 Hyperlipidemia, unspecified: Secondary | ICD-10-CM | POA: Diagnosis present

## 2018-04-29 DIAGNOSIS — G928 Other toxic encephalopathy: Secondary | ICD-10-CM

## 2018-04-29 DIAGNOSIS — M62838 Other muscle spasm: Secondary | ICD-10-CM | POA: Diagnosis not present

## 2018-04-29 DIAGNOSIS — M5440 Lumbago with sciatica, unspecified side: Secondary | ICD-10-CM | POA: Diagnosis present

## 2018-04-29 DIAGNOSIS — M109 Gout, unspecified: Secondary | ICD-10-CM | POA: Diagnosis present

## 2018-04-29 DIAGNOSIS — E871 Hypo-osmolality and hyponatremia: Secondary | ICD-10-CM | POA: Diagnosis not present

## 2018-04-29 DIAGNOSIS — I129 Hypertensive chronic kidney disease with stage 1 through stage 4 chronic kidney disease, or unspecified chronic kidney disease: Secondary | ICD-10-CM | POA: Diagnosis present

## 2018-04-29 DIAGNOSIS — T380X5A Adverse effect of glucocorticoids and synthetic analogues, initial encounter: Secondary | ICD-10-CM | POA: Diagnosis not present

## 2018-04-29 DIAGNOSIS — D72829 Elevated white blood cell count, unspecified: Secondary | ICD-10-CM | POA: Diagnosis present

## 2018-04-29 DIAGNOSIS — N179 Acute kidney failure, unspecified: Secondary | ICD-10-CM | POA: Diagnosis present

## 2018-04-29 DIAGNOSIS — M5416 Radiculopathy, lumbar region: Secondary | ICD-10-CM

## 2018-04-29 DIAGNOSIS — Z833 Family history of diabetes mellitus: Secondary | ICD-10-CM

## 2018-04-29 DIAGNOSIS — E669 Obesity, unspecified: Secondary | ICD-10-CM | POA: Diagnosis present

## 2018-04-29 DIAGNOSIS — Z7951 Long term (current) use of inhaled steroids: Secondary | ICD-10-CM

## 2018-04-29 DIAGNOSIS — Z79890 Hormone replacement therapy (postmenopausal): Secondary | ICD-10-CM

## 2018-04-29 DIAGNOSIS — K5900 Constipation, unspecified: Secondary | ICD-10-CM | POA: Diagnosis present

## 2018-04-29 LAB — CBC WITH DIFFERENTIAL/PLATELET
Abs Immature Granulocytes: 0.1 10*3/uL (ref 0.0–0.1)
BASOS PCT: 0 %
Basophils Absolute: 0 10*3/uL (ref 0.0–0.1)
Eosinophils Absolute: 0 10*3/uL (ref 0.0–0.7)
Eosinophils Relative: 0 %
HCT: 20 % — ABNORMAL LOW (ref 36.0–46.0)
Hemoglobin: 6.7 g/dL — CL (ref 12.0–15.0)
Immature Granulocytes: 1 %
Lymphocytes Relative: 3 %
Lymphs Abs: 0.4 10*3/uL — ABNORMAL LOW (ref 0.7–4.0)
MCH: 30 pg (ref 26.0–34.0)
MCHC: 33.5 g/dL (ref 30.0–36.0)
MCV: 89.7 fL (ref 78.0–100.0)
MONO ABS: 1 10*3/uL (ref 0.1–1.0)
MONOS PCT: 7 %
Neutro Abs: 11.6 10*3/uL — ABNORMAL HIGH (ref 1.7–7.7)
Neutrophils Relative %: 89 %
PLATELETS: 122 10*3/uL — AB (ref 150–400)
RBC: 2.23 MIL/uL — ABNORMAL LOW (ref 3.87–5.11)
RDW: 13.5 % (ref 11.5–15.5)
WBC: 13 10*3/uL — ABNORMAL HIGH (ref 4.0–10.5)

## 2018-04-29 LAB — URINALYSIS, ROUTINE W REFLEX MICROSCOPIC
Bilirubin Urine: NEGATIVE
GLUCOSE, UA: NEGATIVE mg/dL
Ketones, ur: NEGATIVE mg/dL
Nitrite: NEGATIVE
PH: 5 (ref 5.0–8.0)
Protein, ur: 100 mg/dL — AB
SPECIFIC GRAVITY, URINE: 1.01 (ref 1.005–1.030)

## 2018-04-29 LAB — RETICULOCYTES
RBC.: 3.26 MIL/uL — AB (ref 3.87–5.11)
RETIC COUNT ABSOLUTE: 52.2 10*3/uL (ref 19.0–186.0)
RETIC CT PCT: 1.6 % (ref 0.4–3.1)

## 2018-04-29 LAB — BASIC METABOLIC PANEL
Anion gap: 13 (ref 5–15)
BUN: 69 mg/dL — ABNORMAL HIGH (ref 6–20)
CALCIUM: 8.8 mg/dL — AB (ref 8.9–10.3)
CO2: 23 mmol/L (ref 22–32)
CREATININE: 3.89 mg/dL — AB (ref 0.44–1.00)
Chloride: 98 mmol/L — ABNORMAL LOW (ref 101–111)
GFR calc non Af Amer: 10 mL/min — ABNORMAL LOW (ref >60)
GFR, EST AFRICAN AMERICAN: 12 mL/min — AB (ref >60)
Glucose, Bld: 151 mg/dL — ABNORMAL HIGH (ref 65–99)
Potassium: 3.2 mmol/L — ABNORMAL LOW (ref 3.5–5.1)
Sodium: 134 mmol/L — ABNORMAL LOW (ref 135–145)

## 2018-04-29 LAB — FERRITIN: FERRITIN: 204 ng/mL (ref 11–307)

## 2018-04-29 LAB — IRON AND TIBC
Iron: 16 ug/dL — ABNORMAL LOW (ref 28–170)
Saturation Ratios: 7 % — ABNORMAL LOW (ref 10.4–31.8)
TIBC: 235 ug/dL — ABNORMAL LOW (ref 250–450)
UIBC: 219 ug/dL

## 2018-04-29 LAB — MAGNESIUM: Magnesium: 2 mg/dL (ref 1.7–2.4)

## 2018-04-29 LAB — HEMOGLOBIN AND HEMATOCRIT, BLOOD
HEMATOCRIT: 25.2 % — AB (ref 36.0–46.0)
Hemoglobin: 8.5 g/dL — ABNORMAL LOW (ref 12.0–15.0)

## 2018-04-29 LAB — FOLATE: Folate: 69 ng/mL (ref >5.9)

## 2018-04-29 LAB — TYPE AND SCREEN
ABO/RH(D): A POS
Antibody Screen: NEGATIVE

## 2018-04-29 LAB — CK: CK TOTAL: 81 U/L (ref 38–234)

## 2018-04-29 LAB — VITAMIN B12: Vitamin B-12: >7500 pg/mL — ABNORMAL HIGH (ref 180–914)

## 2018-04-29 LAB — ABO/RH: ABO/RH(D): A POS

## 2018-04-29 LAB — POC OCCULT BLOOD, ED: Fecal Occult Bld: NEGATIVE

## 2018-04-29 MED ORDER — FLUTICASONE PROPIONATE 50 MCG/ACT NA SUSP: 2.0000 | Freq: Every day | NASAL | Status: DC | PRN

## 2018-04-29 MED ORDER — FUROSEMIDE 80 MG PO TABS
160.0000 mg | ORAL_TABLET | Freq: Every day | ORAL | Status: DC
  Filled 2018-04-29: qty 2

## 2018-04-29 MED ORDER — METHOCARBAMOL 500 MG PO TABS
500.0000 mg | ORAL_TABLET | Freq: Three times a day (TID) | ORAL | Status: DC | PRN
  Administered 2018-04-29 – 2018-05-04 (×4): 500 mg via ORAL
  Filled 2018-04-29 (×5): qty 1

## 2018-04-29 MED ORDER — ACETAMINOPHEN 650 MG RE SUPP: 650.0000 mg | Freq: Four times a day (QID) | RECTAL | Status: DC | PRN

## 2018-04-29 MED ORDER — HYDRALAZINE HCL 20 MG/ML IJ SOLN: 5.0000 mg | INTRAMUSCULAR | Status: DC | PRN

## 2018-04-29 MED ORDER — SIMVASTATIN 40 MG PO TABS
40.0000 mg | ORAL_TABLET | Freq: Every day | ORAL | Status: DC
  Administered 2018-04-29 – 2018-05-05 (×7): 40 mg via ORAL
  Filled 2018-04-29 (×7): qty 1

## 2018-04-29 MED ORDER — FENTANYL CITRATE (PF) 100 MCG/2ML IJ SOLN
100.0000 ug | Freq: Once | INTRAMUSCULAR | Status: AC
  Administered 2018-04-29: 100 ug via INTRAVENOUS
  Filled 2018-04-29: qty 2

## 2018-04-29 MED ORDER — OXYCODONE-ACETAMINOPHEN 5-325 MG PO TABS
1.0000 | ORAL_TABLET | ORAL | Status: DC | PRN
Start: 2018-04-29 — End: 2018-05-02
  Administered 2018-04-29 – 2018-05-02 (×5): 1 via ORAL
  Filled 2018-04-29 (×7): qty 1

## 2018-04-29 MED ORDER — MORPHINE SULFATE (PF) 4 MG/ML IV SOLN
2.0000 mg | INTRAVENOUS | Status: DC | PRN
  Administered 2018-04-30 – 2018-05-02 (×3): 2 mg via INTRAVENOUS
  Filled 2018-04-29 (×3): qty 1

## 2018-04-29 MED ORDER — ADULT MULTIVITAMIN W/MINERALS CH
1.0000 | ORAL_TABLET | Freq: Every day | ORAL | Status: DC
  Administered 2018-04-30 – 2018-05-05 (×6): 1 via ORAL
  Filled 2018-04-29 (×6): qty 1

## 2018-04-29 MED ORDER — ZOLPIDEM TARTRATE 5 MG PO TABS: 5.0000 mg | ORAL_TABLET | Freq: Every evening | ORAL | Status: DC | PRN

## 2018-04-29 MED ORDER — HYDRALAZINE HCL 50 MG PO TABS
50.0000 mg | ORAL_TABLET | Freq: Three times a day (TID) | ORAL | Status: DC
  Administered 2018-04-29: 50 mg via ORAL
  Filled 2018-04-29 (×2): qty 1

## 2018-04-29 MED ORDER — FOLIC ACID 1 MG PO TABS
1.0000 mg | ORAL_TABLET | Freq: Every day | ORAL | Status: DC
  Administered 2018-04-30 – 2018-05-05 (×6): 1 mg via ORAL
  Filled 2018-04-29 (×6): qty 1

## 2018-04-29 MED ORDER — MULTI-VITAMIN PO TABS: 1.0000 | ORAL_TABLET | Freq: Every day | ORAL | Status: DC

## 2018-04-29 MED ORDER — LORATADINE 10 MG PO TABS
10.0000 mg | ORAL_TABLET | Freq: Every day | ORAL | Status: DC
  Administered 2018-04-29 – 2018-05-05 (×7): 10 mg via ORAL
  Filled 2018-04-29 (×7): qty 1

## 2018-04-29 MED ORDER — ACETAMINOPHEN 325 MG PO TABS
650.0000 mg | ORAL_TABLET | Freq: Four times a day (QID) | ORAL | Status: DC | PRN
  Administered 2018-04-29: 650 mg via ORAL
  Filled 2018-04-29: qty 2

## 2018-04-29 MED ORDER — ONDANSETRON HCL 4 MG/2ML IJ SOLN: 4.0000 mg | Freq: Four times a day (QID) | INTRAMUSCULAR | Status: DC | PRN

## 2018-04-29 MED ORDER — AMLODIPINE BESYLATE 10 MG PO TABS
10.0000 mg | ORAL_TABLET | Freq: Every day | ORAL | Status: DC
  Filled 2018-04-29: qty 1

## 2018-04-29 MED ORDER — POLYETHYLENE GLYCOL 3350 17 G PO PACK: 17.0000 g | PACK | Freq: Every day | ORAL | Status: DC | PRN

## 2018-04-29 MED ORDER — FAMOTIDINE 20 MG PO TABS
20.0000 mg | ORAL_TABLET | Freq: Every day | ORAL | Status: DC
  Administered 2018-04-29 – 2018-05-04 (×5): 20 mg via ORAL
  Filled 2018-04-29 (×6): qty 1

## 2018-04-29 MED ORDER — POTASSIUM CHLORIDE CRYS ER 20 MEQ PO TBCR
40.0000 meq | EXTENDED_RELEASE_TABLET | Freq: Once | ORAL | Status: AC
  Administered 2018-04-29: 40 meq via ORAL
  Filled 2018-04-29: qty 2

## 2018-04-29 MED ORDER — LEVOTHYROXINE SODIUM 75 MCG PO TABS
75.0000 ug | ORAL_TABLET | Freq: Every day | ORAL | Status: DC
  Administered 2018-04-30 – 2018-05-05 (×6): 75 ug via ORAL
  Filled 2018-04-29 (×7): qty 1

## 2018-04-29 MED ORDER — ONDANSETRON HCL 4 MG PO TABS: 4.0000 mg | ORAL_TABLET | Freq: Four times a day (QID) | ORAL | Status: DC | PRN

## 2018-04-29 MED ORDER — MORPHINE SULFATE (PF) 4 MG/ML IV SOLN
4.0000 mg | Freq: Once | INTRAVENOUS | Status: AC
  Administered 2018-04-29: 4 mg via INTRAVENOUS
  Filled 2018-04-29: qty 1

## 2018-04-29 MED ORDER — ASPIRIN EC 81 MG PO TBEC
81.0000 mg | DELAYED_RELEASE_TABLET | Freq: Every day | ORAL | Status: DC
  Administered 2018-04-29 – 2018-05-05 (×7): 81 mg via ORAL
  Filled 2018-04-29 (×7): qty 1

## 2018-04-29 MED ORDER — HEPARIN SODIUM (PORCINE) 5000 UNIT/ML IJ SOLN
5000.0000 [IU] | Freq: Three times a day (TID) | INTRAMUSCULAR | Status: DC
  Administered 2018-04-29 – 2018-05-01 (×6): 5000 [IU] via SUBCUTANEOUS
  Filled 2018-04-29 (×6): qty 1

## 2018-04-29 MED ORDER — ALLOPURINOL 300 MG PO TABS
300.0000 mg | ORAL_TABLET | Freq: Every day | ORAL | Status: DC
  Administered 2018-04-30 – 2018-05-02 (×3): 300 mg via ORAL
  Filled 2018-04-29 (×3): qty 1

## 2018-04-29 NOTE — ED Notes (Signed)
Pt resting in bed with eyes closed.  Family at bedside states I think that muscle relaxer kicked in.  Asked pt what her pain level is, states its an 11.  This RN states so your pain is the worst you could ever imagine having, pt states yes an 11, eyes remains closed.

## 2018-04-29 NOTE — ED Provider Notes (Signed)
[facility] DEPARTMENT Provider Note   CSN: [id number] Arrival date & time: 04/29/18  1536     History   Chief Complaint Chief Complaint  Patient presents with  . Fall  . Hip Pain    HPI Erika Fry is a 72 y.o. female.patient complains of low right-sided paralumbar pain onset 2 days ago after she slipped from a seated position onto the floor. Pain radiates to right foot. Pain is exacerbated by movement and improved with remaining still. She reports she lay on the floor for several hours yesterday after falling family members finally got her up. She felt well prior to the fall other than she had urinary frequency for 2 days prior to the fall, she denies having fever denies lightheadedness. She tried to get to a chiropractor today however was unable to get out of the cars the chiropractor suggested that she come to the hospital. No loss of bladder or bowel control. No other injury. She had no back pain prior to the fall. No fever. EMS treated patient with fentanyl 50 g IV with partial relief. Denies hip pain denies abdominal pain.  HPI  Past Medical History:  Diagnosis Date  . Arthritis    "knees, thumbs" (03/25/2018)  . CKD (chronic kidney disease), stage IV ([place])   . Diet-controlled diabetes mellitus ([place])   . GERD (gastroesophageal reflux disease)   . Gout    "on daily RX" (03/25/2018)  . Heart murmur   . High cholesterol   . Hypertension   . Hypothyroidism   . Iron deficiency anemia    "had to get an iron infusion"  . Migraine    "used to have them growing up" (03/25/2018)  . Presence of permanent cardiac pacemaker 03/25/2018    Patient Active Problem List   Diagnosis Date Noted  . AV block, Mobitz 2 03/25/2018  . Mobitz type 2 second degree AV block 03/20/2018  . Aortic stenosis 03/20/2018  . Exertional dyspnea 03/19/2018    Past Surgical History:  Procedure Laterality Date  . APPENDECTOMY    . INSERT / REPLACE / REMOVE  PACEMAKER  03/25/2018  . KNEE ARTHROSCOPY Bilateral   . PACEMAKER IMPLANT N/A 03/25/2018   Procedure: PACEMAKER IMPLANT;  Surgeon: Evans Lance, MD;  Location: [facility];  Service: Cardiovascular;  Laterality: N/A;  . PACEMAKER PLACEMENT Left 03/2018  . RIGHT HEART CATH N/A 03/20/2018   Procedure: RIGHT HEART CATH;  Surgeon: Nigel Mormon, MD;  Location: [facility];  Service: Cardiovascular;  Laterality: N/A;  . TEE WITHOUT CARDIOVERSION N/A 03/20/2018   Procedure: TRANSESOPHAGEAL ECHOCARDIOGRAM (TEE);  Surgeon: Nigel Mormon, MD;  Location: [facility] ENDOSCOPY;  Service: Cardiovascular;  Laterality: N/A;  . TONSILLECTOMY       OB History   None      Home Medications    Prior to Admission medications   Medication Sig Start Date End Date Taking? Authorizing Provider  allopurinol (ZYLOPRIM) 300 MG tablet Take 300 mg by mouth daily.     [provider]  amLODipine (NORVASC) 10 MG tablet Take 10 mg by mouth daily.    [provider]  aspirin EC 81 MG tablet Take 81 mg by mouth daily.    [provider]  cetirizine (ZYRTEC) 10 MG tablet Chew 10 mg by mouth daily.    [provider]  fluticasone (FLONASE) 50 MCG/ACT nasal spray Place 2 sprays into both nostrils daily as needed for allergies or rhinitis.  [provider]  folic acid (FOLVITE) 1 MG tablet Take 1 mg by mouth daily.    [provider]  furosemide (LASIX) 80 MG tablet Take 80 mg by mouth 2 (two) times daily.     [provider]  hydrALAZINE (APRESOLINE) 50 MG tablet Take 50 mg by mouth 3 (three) times daily.    [provider]  levothyroxine (SYNTHROID, LEVOTHROID) 75 MCG tablet Take 75 mcg by mouth daily before breakfast.    [provider]  Multiple Vitamin (MULTI-VITAMIN PO) Take 1 tablet by mouth daily.    [provider]  ranitidine (ZANTAC) 150 MG tablet Take 150 mg by mouth daily.     [provider]  simvastatin (ZOCOR) 40 MG tablet Take 40 mg by mouth daily.     [provider]    Family History No family history on file.  Social History Social History   Tobacco Use  . Smoking status: Never Smoker  . Smokeless tobacco: Never Used  Substance Use Topics  . Alcohol use: Not Currently  . Drug use: Never     Allergies   Codeine   Review of Systems Review of Systems  Constitutional: Negative.   HENT: Negative.   Respiratory: Negative.   Cardiovascular: Negative.   Gastrointestinal: Negative.   Genitourinary: Positive for frequency.  Musculoskeletal: Positive for back pain and gait problem.  Skin: Negative.   Allergic/Immunologic: Positive for immunocompromised state.       Diabetic  Psychiatric/Behavioral: Negative.   All other systems reviewed and are negative.    Physical Exam Updated Vital Signs Ht 5\' 5"  (1.651 m)   Wt 90.7 kg (200 lb)   SpO2 98%   BMI 33.28 kg/m   Physical Exam  Constitutional: She appears well-developed and well-nourished. No distress.  HENT:  Head: Normocephalic and atraumatic.  Eyes: Pupils are equal, round, and reactive to light. Conjunctivae are normal.  Neck: Neck supple. No tracheal deviation present. No thyromegaly present.  Cervical spine nontender  Cardiovascular: Normal rate and regular rhythm.  No murmur heard. Pulmonary/Chest: Effort normal and breath sounds normal.  Abdominal: Soft. Bowel sounds are normal. She exhibits no distension. There is no tenderness.  Genitourinary: Rectal exam shows guaiac negative stool.  Genitourinary Comments: Rectal normal tone brown stool no gross blood  Musculoskeletal: Normal range of motion. She exhibits no edema or tenderness.  She is nontender over cervical spine or thoracic spine. Positive right-sided para lumbar tenderness. Pelvis stable nontender. All 4 extremities no contusion abrasion or tenderness neurovascular intact. Bilateral lower extremities without pain on  internal or external rotation of the thigh. Pelvis is stable, tender at right SI joint otherwise nontender  Neurological: She is alert. Coordination normal.  Skin: Skin is warm and dry. No rash noted.  Psychiatric: She has a normal mood and affect.  Nursing note and vitals reviewed.    ED Treatments / Results  Labs (all labs ordered are listed, but only abnormal results are displayed) Labs Reviewed - No data to display  EKG None  Radiology No results found.  Procedures Procedures (including critical care time)  Medications Ordered in ED Medications - No data to display  Patient has pacemaker which is MRI compatible,, however pacemaker was placed recently therefore MRI requires cardiology consult. I don't feel the patient needs emergent MRI. She'll likely be hospitalized today. CT scan of lumbar spine ordered as alternative after discussion with Dr., radiology Results for orders placed or performed during the hospital encounter of  78/46/96  Basic metabolic panel  Result Value Ref Range   Sodium 134 (L) 135 - 145 mmol/L   Potassium 3.2 (L) 3.5 - 5.1 mmol/L   Chloride 98 (L) 101 - 111 mmol/L   CO2 23 22 - 32 mmol/L   Glucose, Bld 151 (H) 65 - 99 mg/dL   BUN 69 (H) 6 - 20 mg/dL   Creatinine, Ser 3.89 (H) 0.44 - 1.00 mg/dL   Calcium 8.8 (L) 8.9 - 10.3 mg/dL   GFR calc non Af Amer 10 (L) >60 mL/min   GFR calc Af Amer 12 (L) >60 mL/min   Anion gap 13 5 - 15  CBC with Differential/Platelet  Result Value Ref Range   WBC 13.0 (H) 4.0 - 10.5 K/uL   RBC 2.23 (L) 3.87 - 5.11 MIL/uL   Hemoglobin 6.7 (LL) 12.0 - 15.0 g/dL   HCT 20.0 (L) 36.0 - 46.0 %   MCV 89.7 78.0 - 100.0 fL   MCH 30.0 26.0 - 34.0 pg   MCHC 33.5 30.0 - 36.0 g/dL   RDW 13.5 11.5 - 15.5 %   Platelets 122 (L) 150 - 400 K/uL   Neutrophils Relative % 89 %   Neutro Abs 11.6 (H) 1.7 - 7.7 K/uL   Lymphocytes Relative 3 %   Lymphs Abs 0.4 (L) 0.7 - 4.0 K/uL   Monocytes Relative 7 %   Monocytes Absolute 1.0 0.1 - 1.0  K/uL   Eosinophils Relative 0 %   Eosinophils Absolute 0.0 0.0 - 0.7 K/uL   Basophils Relative 0 %   Basophils Absolute 0.0 0.0 - 0.1 K/uL   Immature Granulocytes 1 %   Abs Immature Granulocytes 0.1 0.0 - 0.1 K/uL  CK  Result Value Ref Range   Total CK 81 38 - 234 U/L  Hemoglobin and hematocrit, blood  Result Value Ref Range   Hemoglobin 8.5 (L) 12.0 - 15.0 g/dL   HCT 25.2 (L) 36.0 - 46.0 %  POC occult blood, ED Provider will collect  Result Value Ref Range   Fecal Occult Bld NEGATIVE NEGATIVE  Type and screen [place]  Result Value Ref Range   ABO/RH(D) A POS    Antibody Screen NEG    Sample Expiration      05/02/2018 Performed at [facility], [address], [place], [place] [address]   ABO/Rh  Result Value Ref Range   ABO/RH(D)      A POS Performed at [facility] [address], [place], [place] [address]    Ct Lumbar Spine Wo Contrast  Result Date: 04/29/2018 CLINICAL DATA:  Right hip pain.  Back pain. EXAM: CT LUMBAR SPINE WITHOUT CONTRAST TECHNIQUE: Multidetector CT imaging of the lumbar spine was performed without intravenous contrast administration. Multiplanar CT image reconstructions were also generated. COMPARISON:  None. FINDINGS: Segmentation: 5 lumbar type vertebrae. Alignment: Normal. Vertebrae: No acute fracture or focal pathologic process. Paraspinal and other soft tissues: Negative. Other: Mild osteoarthritis of bilateral sacroiliac joints. Abdominal aortic atherosclerosis. Disc levels: Mild degenerative disc disease with disc height loss at T11-12 and to lesser extent L1-2, L4-5. T12-L1: Mild broad-based disc bulge. Mild bilateral facet arthropathy. No foraminal stenosis. L1-L2: Broad-based disc bulge. Mild bilateral facet arthropathy. No foraminal stenosis. L2-L3: Broad-based disc bulge with a small left foraminal disc protrusion contacting the left L2 nerve root. Mild bilateral facet arthropathy. No foraminal stenosis. L3-L4:  Broad-based disc bulge eccentric towards the left. Moderate bilateral facet arthropathy. Mild spinal stenosis.  No foraminal stenosis. L4-L5: Broad-based disc bulge. Moderate-severe bilateral facet arthropathy. Mild spinal stenosis. No significant foraminal stenosis. L5-S1: Small right paracentral disc protrusion. No foraminal stenosis. Mild bilateral facet arthropathy. IMPRESSION: 1.  No acute osseous injury of the lumbar spine. 2. Diffuse lumbar spine spondylosis as described above. Electronically Signed   By: Kathreen Devoid   On: 04/29/2018 17:57   Ct Pelvis Wo Contrast  Result Date: 04/29/2018 CLINICAL DATA:  Pain post fall EXAM: CT PELVIS WITHOUT CONTRAST TECHNIQUE: Multidetector CT imaging of the pelvis was performed following the standard protocol without intravenous contrast. COMPARISON:  None. FINDINGS: Urinary Tract:  No abnormality visualized. Bowel:  Unremarkable visualized pelvic bowel loops. Vascular/Lymphatic: Patchy iliofemoral arterial calcifications. No adenopathy. Reproductive:  No mass or other significant abnormality Other: No ascites.  No free air. Musculoskeletal: Degenerative disc disease L4-5. L5 is a transitional segment with left-sided assimilation joint with the pelvis. Negative for fracture or dislocation. Regional soft tissues unremarkable. IMPRESSION: 1. Negative for fracture, dislocation, or other acute finding. 2. Degenerative disc disease L4-5. Electronically Signed   By: Lucrezia Europe M.D.   On: 04/29/2018 17:53   Initial Impression / Assessment and Plan / ED Course  I have reviewed the triage vital signs and the nursing notes.  Pertinent labs & imaging results that were available during my care of the patient were reviewed by me and considered in my medical decision making (see chart for details).     6:50 PM continues to complain of severe low back pain after treatment with intravenous opioids. Additional IV morphine Ordered.   I consulted Dr.Niu who will arrange for  overnight stay and pain control Lab work consistent with hypokalemia, anemia and chronic renal insufficiency.renal insufficiency unchanged from a month ago. Anemia somewhat worse thanone month ago.oral potassium supplementation ordered Final Clinical Impressions(s) / ED Diagnoses  Diagnosis #1 lumbar radiculopathy #2 hypokalemia Final diagnoses:  None  #3 chronic renal insufficiency #4 anemia  ED Discharge Orders    None       Orlie Dakin, MD 04/29/18 1921

## 2018-04-29 NOTE — Care Management (Signed)
ED CM reviewed CM consult for home health needs. Patient is being admitted. Unit CM to follow for home health needs. Venita Sheffield RN CCM

## 2018-04-29 NOTE — ED Triage Notes (Signed)
Pt arrived via [facility] from home c/o right hip pain.  Yesterday around 1530 pt slipped out of recliner at home.  Pt reports she layed on the floor for 3 hours before son came to help her up.  EMS was called to house and pt refused transport at that time and wanted to go to her chiropractor instead.  EMS called today for right hip pain.  Pt states she doesn't have pain when she is still.  EMS gave 22mcg fentanyl.

## 2018-04-29 NOTE — ED Notes (Signed)
MD Niu at bedside  

## 2018-04-29 NOTE — ED Notes (Signed)
Family at bedside. 

## 2018-04-29 NOTE — ED Notes (Signed)
This RN accompanied pt to MRI

## 2018-04-29 NOTE — H&P (Signed)
History and Physical    Erika Fry WCH:[id number] DOB: [birth year] DOA: 04/29/2018  Referring MD/NP/PA:   PCP: [name], MD   Patient coming from:  The patient is coming from home.  At baseline, pt is independent for most of ADL.  Chief Complaint: fall and right lower back pain  HPI: Erika Fry is a 72 y.o. female with medical history significant of hypertension, hyperlipidemia, diet-controlled diabetes, GERD, hypothyroidism, gout, CKD-4, iron deficiency anemia, second-degree AV block, pacemaker placement, aortic stenosis, who presents with a fall and right lower back pain.  Pt states that she slipped out of recliner at home at about 15:30. Pt reports she layed on the floor for about 3 hours before son came to help her up. She developed pain the right lower back. EMS was called to house and pt refused transport at that time and wanted to go to her chiropractor instead.  She still has severe right lower back pain, which is constant, 10 out of 10 severity, sharp, radiating to right leg posteriorly.  She does not have leg numbness or weakness.  No loss of control of bladder or bowel movement.  The pain is aggravated by movement.  Patient states that she does not have chest pain, shortness breath at rest.  No cough, fever or chills.  Denies nausea, vomiting, diarrhea, abdominal pain, symptoms of UTI or unilateral weakness. She states that she is taking lasix 160 mg daily in AM. She had pacemaker placement 4 weeks ago.  ED Course: pt was found to have WBC 13.0, hemoglobin 6.7 on first CBC, but 8.5 on repeated CBC, negative FOBT, CK 81, potassium 3.2, renal function close to baseline, oxygen saturation 95% on room air. CT of lumbar spine and pelvis negative for acute bony fracture.  Patient is placed on telemetry bed for observation.   Review of Systems:   General: no fevers, chills, no body weight gain, has fatigue HEENT: no blurry vision, hearing changes or sore  throat Respiratory: no dyspnea, coughing, wheezing CV: no chest pain, no palpitations GI: no nausea, vomiting, abdominal pain, diarrhea, constipation GU: no dysuria, burning on urination, increased urinary frequency, hematuria  Ext: mild leg edema Neuro: no unilateral weakness, numbness, or tingling, no vision change or hearing loss. Had fall. Skin: no rash, no skin tear. MSK: has right lower back pain Heme: No easy bruising.  Travel history: No recent long distant travel.  Allergy:  Allergies  Allergen Reactions  . Codeine Other (See Comments)    Increases Pain and couldn't sleep    Past Medical History:  Diagnosis Date  . Arthritis    "knees, thumbs" (03/25/2018)  . CKD (chronic kidney disease), stage IV ([place])   . Diet-controlled diabetes mellitus ([place])   . GERD (gastroesophageal reflux disease)   . Gout    "on daily RX" (03/25/2018)  . Heart murmur   . High cholesterol   . Hypertension   . Hypothyroidism   . Iron deficiency anemia    "had to get an iron infusion"  . Migraine    "used to have them growing up" (03/25/2018)  . Presence of permanent cardiac pacemaker 03/25/2018    Past Surgical History:  Procedure Laterality Date  . APPENDECTOMY    . INSERT / REPLACE / REMOVE PACEMAKER  03/25/2018  . KNEE ARTHROSCOPY Bilateral   . PACEMAKER IMPLANT N/A 03/25/2018   Procedure: PACEMAKER IMPLANT;  Surgeon: Evans Lance, MD;  Location: [facility];  Service: Cardiovascular;  Laterality: N/A;  .  PACEMAKER PLACEMENT Left 03/2018  . RIGHT HEART CATH N/A 03/20/2018   Procedure: RIGHT HEART CATH;  Surgeon: Nigel Mormon, MD;  Location: [facility];  Service: Cardiovascular;  Laterality: N/A;  . TEE WITHOUT CARDIOVERSION N/A 03/20/2018   Procedure: TRANSESOPHAGEAL ECHOCARDIOGRAM (TEE);  Surgeon: Nigel Mormon, MD;  Location: [facility];  Service: Cardiovascular;  Laterality: N/A;  . TONSILLECTOMY      Social History:  reports that she has never  smoked. She has never used smokeless tobacco. She reports that she drank alcohol. She reports that she does not use drugs.  Family History:  Family History  Problem Relation Age of Onset  . Hypertension Mother   . Diabetes Mellitus II Father   . Heart disease Father   . Gastric cancer Brother   . Diabetes Mellitus II Brother      Prior to Admission medications   Medication Sig Start Date End Date Taking? Authorizing Provider  allopurinol (ZYLOPRIM) 300 MG tablet Take 300 mg by mouth daily.     [provider]  amLODipine (NORVASC) 10 MG tablet Take 10 mg by mouth daily.    [provider]  aspirin EC 81 MG tablet Take 81 mg by mouth daily.    [provider]  cetirizine (ZYRTEC) 10 MG tablet Chew 10 mg by mouth daily.    [provider]  fluticasone (FLONASE) 50 MCG/ACT nasal spray Place 2 sprays into both nostrils daily as needed for allergies or rhinitis.    [provider]  folic acid (FOLVITE) 1 MG tablet Take 1 mg by mouth daily.    [provider]  furosemide (LASIX) 80 MG tablet Take 80 mg by mouth 2 (two) times daily.     [provider]  hydrALAZINE (APRESOLINE) 50 MG tablet Take 50 mg by mouth 3 (three) times daily.    [provider]  levothyroxine (SYNTHROID, LEVOTHROID) 75 MCG tablet Take 75 mcg by mouth daily before breakfast.    [provider]  Multiple Vitamin (MULTI-VITAMIN PO) Take 1 tablet by mouth daily.    [provider]  ranitidine (ZANTAC) 150 MG tablet Take 150 mg by mouth daily.     [provider]  simvastatin (ZOCOR) 40 MG tablet Take 40 mg by mouth daily.     [provider]    Physical Exam: Vitals:   04/29/18 2300 04/30/18 0000 04/30/18 0100 04/30/18 0113  BP: (!) 118/59 (!) 106/56 (!) 105/58 119/64  Pulse: (!) 109 (!) 101 95 96  Resp: (!) 23 20 18 18   Temp:    98.2 F (36.8 C)  TempSrc:      SpO2: (!) 82% 94% (!) 89% 97%  Weight:       Height:       General: Not in acute distress HEENT:       Eyes: PERRL, EOMI, no scleral icterus.       ENT: No discharge from the ears and nose, no pharynx injection, no tonsillar enlargement.        Neck: No JVD, no bruit, no mass felt. Heme: No neck lymph node enlargement. Cardiac: I7/T2, RRR, 3/6 systolic murmurs, No gallops or rubs. Respiratory: No rales, wheezing, rhonchi or rubs. GI: Soft, nondistended, nontender, no rebound pain, no organomegaly, BS present. GU: No hematuria Ext: has trace leg edema bilaterally. 2+DP/PT pulse bilaterally. Musculoskeletal: has tenderness in right lower paraspinal area Skin: No rashes.  Neuro: Alert, oriented X3, cranial nerves II-XII grossly intact, moves  all extremities. Psych: Patient is not psychotic, no suicidal or hemocidal ideation.  Labs on Admission: I have personally reviewed following labs and imaging studies  CBC: Recent Labs  Lab 04/29/18 1622 04/29/18 1704  WBC 13.0*  --   NEUTROABS 11.6*  --   HGB 6.7* 8.5*  HCT 20.0* 25.2*  MCV 89.7  --   PLT 122*  --    Basic Metabolic Panel: Recent Labs  Lab 04/29/18 1622 04/29/18 1942  NA 134*  --   K 3.2*  --   CL 98*  --   CO2 23  --   GLUCOSE 151*  --   BUN 69*  --   CREATININE 3.89*  --   CALCIUM 8.8*  --   MG  --  2.0   GFR: Estimated Creatinine Clearance: 14.1 mL/min (A) (by C-G formula based on SCr of 3.89 mg/dL (H)). Liver Function Tests: No results for input(s): AST, ALT, ALKPHOS, BILITOT, PROT, ALBUMIN in the last 168 hours. No results for input(s): LIPASE, AMYLASE in the last 168 hours. No results for input(s): AMMONIA in the last 168 hours. Coagulation Profile: No results for input(s): INR, PROTIME in the last 168 hours. Cardiac Enzymes: Recent Labs  Lab 04/29/18 1622  CKTOTAL 81   BNP (last 3 results) No results for input(s): PROBNP in the last 8760 hours. HbA1C: No results for input(s): HGBA1C in the last 72 hours. CBG: No results for input(s):  GLUCAP in the last 168 hours. Lipid Profile: No results for input(s): CHOL, HDL, LDLCALC, TRIG, CHOLHDL, LDLDIRECT in the last 72 hours. Thyroid Function Tests: No results for input(s): TSH, T4TOTAL, FREET4, T3FREE, THYROIDAB in the last 72 hours. Anemia Panel: Recent Labs    04/29/18 1942  VITAMINB12 >7,500*  FOLATE 69.0  FERRITIN 204  TIBC 235*  IRON 16*  RETICCTPCT 1.6   Urine analysis:    Component Value Date/Time   COLORURINE YELLOW 04/29/2018 1953   APPEARANCEUR CLEAR 04/29/2018 1953   LABSPEC 1.010 04/29/2018 1953   PHURINE 5.0 04/29/2018 1953   GLUCOSEU NEGATIVE 04/29/2018 1953   HGBUR SMALL (A) 04/29/2018 1953   BILIRUBINUR NEGATIVE 04/29/2018 [place] NEGATIVE 04/29/2018 1953   PROTEINUR 100 (A) 04/29/2018 1953   NITRITE NEGATIVE 04/29/2018 1953   LEUKOCYTESUR MODERATE (A) 04/29/2018 1953   Sepsis Labs: @LABRCNTIP (procalcitonin:4,lacticidven:4) )No results found for this or any previous visit (from the past 240 hour(s)).   Radiological Exams on Admission: Ct Lumbar Spine Wo Contrast  Result Date: 04/29/2018 CLINICAL DATA:  Right hip pain.  Back pain. EXAM: CT LUMBAR SPINE WITHOUT CONTRAST TECHNIQUE: Multidetector CT imaging of the lumbar spine was performed without intravenous contrast administration. Multiplanar CT image reconstructions were also generated. COMPARISON:  None. FINDINGS: Segmentation: 5 lumbar type vertebrae. Alignment: Normal. Vertebrae: No acute fracture or focal pathologic process. Paraspinal and other soft tissues: Negative. Other: Mild osteoarthritis of bilateral sacroiliac joints. Abdominal aortic atherosclerosis. Disc levels: Mild degenerative disc disease with disc height loss at T11-12 and to lesser extent L1-2, L4-5. T12-L1: Mild broad-based disc bulge. Mild bilateral facet arthropathy. No foraminal stenosis. L1-L2: Broad-based disc bulge. Mild bilateral facet arthropathy. No foraminal stenosis. L2-L3: Broad-based disc bulge with a  small left foraminal disc protrusion contacting the left L2 nerve root. Mild bilateral facet arthropathy. No foraminal stenosis. L3-L4: Broad-based disc bulge eccentric towards the left. Moderate bilateral facet arthropathy. Mild spinal stenosis. No foraminal stenosis. L4-L5: Broad-based disc bulge. Moderate-severe bilateral facet arthropathy. Mild spinal stenosis. No significant foraminal stenosis. L5-S1:  Small right paracentral disc protrusion. No foraminal stenosis. Mild bilateral facet arthropathy. IMPRESSION: 1.  No acute osseous injury of the lumbar spine. 2. Diffuse lumbar spine spondylosis as described above. Electronically Signed   By: Kathreen Devoid   On: 04/29/2018 17:57   Ct Pelvis Wo Contrast  Result Date: 04/29/2018 CLINICAL DATA:  Pain post fall EXAM: CT PELVIS WITHOUT CONTRAST TECHNIQUE: Multidetector CT imaging of the pelvis was performed following the standard protocol without intravenous contrast. COMPARISON:  None. FINDINGS: Urinary Tract:  No abnormality visualized. Bowel:  Unremarkable visualized pelvic bowel loops. Vascular/Lymphatic: Patchy iliofemoral arterial calcifications. No adenopathy. Reproductive:  No mass or other significant abnormality Other: No ascites.  No free air. Musculoskeletal: Degenerative disc disease L4-5. L5 is a transitional segment with left-sided assimilation joint with the pelvis. Negative for fracture or dislocation. Regional soft tissues unremarkable. IMPRESSION: 1. Negative for fracture, dislocation, or other acute finding. 2. Degenerative disc disease L4-5. Electronically Signed   By: Lucrezia Europe M.D.   On: 04/29/2018 17:53     EKG: Independently reviewed.  Paced rhythm, QTC 628  Assessment/Plan Principal Problem:   Fall Active Problems:   CKD (chronic kidney disease), stage IV ([facility])   Diet-controlled diabetes mellitus ([facility])   GERD (gastroesophageal reflux disease)   Gout   High cholesterol   Hypothyroidism   Iron deficiency anemia   Essential  hypertension   Back pain   Leukocytosis   Hypokalemia   UTI (urinary tract infection)   Fall and right lower back pain: CT scan of L spin and pelvis did not show acute bony fracture. No alarming symptoms, such as leg weakness or numbness, incontinence.  Likely due to paraspinal muscle spasm.  Will treat patient as muscle spasm.  If symptoms does not improve or worsening, may consider MRI of the lumbar spine.  Patient just had pacemaker placement 4 weeks ago, will need cardiology consult if MRI is needed.  - will place on tele bed for obs - Pain control: morphine prn and percocet - When necessary Zofran for nausea - Robaxin for muscle spasm - PT/OT when able to (not ordered now)  Leukocytosis: Likely due to stress-induced demargination. Patient does not have signs of infection. -Follow-up CBC - f/u UA  Addendum: UTI positive for UTI -IV rocephin -f/u Bx and Ux.  CKD (chronic kidney disease), stage IV [facility]): Renal function close to baseline.  Baseline creatinine 0.0-4.0.  Her creatinine is 3.89, BUN 69.  Patient states that she is followed up by Dr. Florene Glen. -f/u renal Fx by BMP -pt is on lasix 160 mg daily  Diet-controlled diabetes mellitus ([place]): no A1c on record. CBG 151. -check CBG qAM -check A1c  GERD: -Pepcid IV  Gout:  -continue home allopurinol  High cholesterol: -zocor  Hypothyroidism: Last TSH not on record -Continue home Synthroid  Iron deficiency anemia: hemoglobin 6.7 on first CBC, but 8.5 on repeated CBC. hgb was 9.6 on 03/20/18. -check anemia panel  HTN:  -Continue home medications: Amlodipine, hydralazine -IV hydralazine prn  Hypokalemia: K= 3.2 on admission. - Repleted - Check Mg level  DVT ppx: SQ Heparin          Code Status: Full code Family Communication:   Yes, patient's 2 sons and 2 daughters in-law at bed side Disposition Plan:  Anticipate discharge back to previous home environment Consults called:  none Admission status: Obs / tele     Date of Service 04/30/2018    [facility] Pager [phone]  If 7PM-7AM,  please contact night-coverage [email or web address] Password [email or web address] 04/30/2018, 3:16 AM

## 2018-04-30 ENCOUNTER — Other Ambulatory Visit: Payer: Self-pay

## 2018-04-30 ENCOUNTER — Encounter (HOSPITAL_COMMUNITY): Payer: Self-pay

## 2018-04-30 DIAGNOSIS — W19XXXD Unspecified fall, subsequent encounter: Secondary | ICD-10-CM

## 2018-04-30 DIAGNOSIS — I10 Essential (primary) hypertension: Secondary | ICD-10-CM | POA: Diagnosis not present

## 2018-04-30 DIAGNOSIS — N179 Acute kidney failure, unspecified: Secondary | ICD-10-CM | POA: Diagnosis not present

## 2018-04-30 DIAGNOSIS — D696 Thrombocytopenia, unspecified: Secondary | ICD-10-CM | POA: Diagnosis not present

## 2018-04-30 DIAGNOSIS — N184 Chronic kidney disease, stage 4 (severe): Secondary | ICD-10-CM | POA: Diagnosis not present

## 2018-04-30 DIAGNOSIS — B3749 Other urogenital candidiasis: Secondary | ICD-10-CM | POA: Diagnosis present

## 2018-04-30 DIAGNOSIS — K219 Gastro-esophageal reflux disease without esophagitis: Secondary | ICD-10-CM | POA: Diagnosis not present

## 2018-04-30 DIAGNOSIS — M5441 Lumbago with sciatica, right side: Principal | ICD-10-CM

## 2018-04-30 DIAGNOSIS — D72829 Elevated white blood cell count, unspecified: Secondary | ICD-10-CM | POA: Diagnosis not present

## 2018-04-30 DIAGNOSIS — E7800 Pure hypercholesterolemia, unspecified: Secondary | ICD-10-CM

## 2018-04-30 DIAGNOSIS — E119 Type 2 diabetes mellitus without complications: Secondary | ICD-10-CM

## 2018-04-30 DIAGNOSIS — N3000 Acute cystitis without hematuria: Secondary | ICD-10-CM | POA: Diagnosis not present

## 2018-04-30 DIAGNOSIS — E876 Hypokalemia: Secondary | ICD-10-CM

## 2018-04-30 DIAGNOSIS — N390 Urinary tract infection, site not specified: Secondary | ICD-10-CM | POA: Diagnosis present

## 2018-04-30 LAB — CBC
HCT: 24.5 % — ABNORMAL LOW (ref 36.0–46.0)
Hemoglobin: 8.2 g/dL — ABNORMAL LOW (ref 12.0–15.0)
MCH: 29.6 pg (ref 26.0–34.0)
MCHC: 33.5 g/dL (ref 30.0–36.0)
MCV: 88.4 fL (ref 78.0–100.0)
PLATELETS: 135 10*3/uL — AB (ref 150–400)
RBC: 2.77 MIL/uL — ABNORMAL LOW (ref 3.87–5.11)
RDW: 13.2 % (ref 11.5–15.5)
WBC: 24.3 10*3/uL — ABNORMAL HIGH (ref 4.0–10.5)

## 2018-04-30 LAB — BASIC METABOLIC PANEL
Anion gap: 12 (ref 5–15)
BUN: 73 mg/dL — AB (ref 6–20)
CALCIUM: 9 mg/dL (ref 8.9–10.3)
CO2: 25 mmol/L (ref 22–32)
Chloride: 98 mmol/L — ABNORMAL LOW (ref 101–111)
Creatinine, Ser: 4.35 mg/dL — ABNORMAL HIGH (ref 0.44–1.00)
GFR calc Af Amer: 11 mL/min — ABNORMAL LOW (ref >60)
GFR, EST NON AFRICAN AMERICAN: 9 mL/min — AB (ref >60)
GLUCOSE: 157 mg/dL — AB (ref 65–99)
Potassium: 3.1 mmol/L — ABNORMAL LOW (ref 3.5–5.1)
SODIUM: 135 mmol/L (ref 135–145)

## 2018-04-30 LAB — HEMOGLOBIN A1C
HEMOGLOBIN A1C: 6.3 % — AB (ref 4.8–5.6)
MEAN PLASMA GLUCOSE: 134.11 mg/dL

## 2018-04-30 LAB — MRSA PCR SCREENING: MRSA BY PCR: NEGATIVE

## 2018-04-30 LAB — GLUCOSE, CAPILLARY: GLUCOSE-CAPILLARY: 153 mg/dL — AB (ref 65–99)

## 2018-04-30 MED ORDER — PNEUMOCOCCAL VAC POLYVALENT 25 MCG/0.5ML IJ INJ
0.5000 mL | INJECTION | INTRAMUSCULAR | Status: AC
  Administered 2018-05-01: 0.5 mL via INTRAMUSCULAR
  Filled 2018-04-30: qty 0.5

## 2018-04-30 MED ORDER — SODIUM CHLORIDE 0.9 % IV SOLN
1.0000 g | INTRAVENOUS | Status: DC
  Administered 2018-04-30 – 2018-05-02 (×3): 1 g via INTRAVENOUS
  Filled 2018-04-30 (×3): qty 10

## 2018-04-30 MED ORDER — DICLOFENAC SODIUM 1 % TD GEL
4.0000 g | Freq: Four times a day (QID) | TRANSDERMAL | Status: DC
  Administered 2018-04-30 – 2018-05-01 (×5): 4 g via TOPICAL
  Filled 2018-04-30: qty 100

## 2018-04-30 MED ORDER — ACETAMINOPHEN 500 MG PO TABS
500.0000 mg | ORAL_TABLET | Freq: Three times a day (TID) | ORAL | Status: DC
  Administered 2018-04-30 – 2018-05-05 (×15): 500 mg via ORAL
  Filled 2018-04-30 (×16): qty 1

## 2018-04-30 MED ORDER — POTASSIUM CHLORIDE CRYS ER 20 MEQ PO TBCR
40.0000 meq | EXTENDED_RELEASE_TABLET | Freq: Once | ORAL | Status: AC
  Administered 2018-04-30: 40 meq via ORAL
  Filled 2018-04-30: qty 2

## 2018-04-30 NOTE — Progress Notes (Signed)
Pts Bp 98/58 manually in right arm, and 110/52 manually in left arm Dr. Algis Liming notified holding bp Medications for now

## 2018-04-30 NOTE — Progress Notes (Signed)
Erika Fry was admitted to 5w11 from ED overflow via bed.  The patient is alert and oriented x4.  The patient's daughter-in-law is at the bedside.  Bed alarm is activated.  Bed is in the lowest position, call bell and telephone are within reach.  Explained to patient how to use call bell and telephone, patient indicated understanding.  Admission booklet given.  Will continue to monitor.

## 2018-04-30 NOTE — Evaluation (Addendum)
Physical Therapy Evaluation Patient Details Name: Erika Fry MRN: [id number] DOB: [birth year] Today's Date: 04/30/2018   History of Present Illness  Pt. is a 72 y.o. F with significant PMH of hypertension, hyperlipidemia, diet controlled diabetes, GERD, hypothyroidism, gout, CKD-4, iron deficiency anemia, second degree AV block, pacemaker placement, aortic stenosis, who presents after a fall and right lower back pain. CT of lumbar spine and pelvis negative for acute bony fracture.  Clinical Impression  Limited evaluation secondary to pain (patient premedicated prior to session). Unable to actively move bilateral lower extremities likely secondary to fear of pain rather than actual weakness. Requiring total assistance for progressing from supine to sidelying on left side. Patient screaming and crying and refusing to sit up on edge of bed. Very slow and guarded with all movements. Does state that she ambulated last night in the room with a walker. Suspect patient will progress once pain is controlled, but based on PT evaluation, recommending rehab at this time. Will follow acutely to progress mobility and independence.     Follow Up Recommendations SNF    Equipment Recommendations  Rolling walker with 5" wheels;3in1 (PT)    Recommendations for Other Services       Precautions / Restrictions Precautions Precautions: Fall;Other (comment);ICD/Pacemaker(back for comfort) Precaution Comments: Pacemaker placement 4 weeks ago Restrictions Weight Bearing Restrictions: No      Mobility  Bed Mobility Overal bed mobility: Needs Assistance Bed Mobility: Rolling Rolling: Total assist         General bed mobility comments: total assist (>75%) for rolling to right. PT assisted RLE into knee flexion and cueing to reach right arm over for railing as well as turn head to left. Patient screaming and crying during roll, with significant increase in back pain. Patient very slow with all  movements. Stayed in half side lying position for ~10 minutes, stating, "Just give me a minute," but ultimately refusing to progress to sitting EOB.   Transfers                 General transfer comment: Not able to attempt secondary to pain  Ambulation/Gait                Stairs            Wheelchair Mobility    Modified Rankin (Stroke Patients Only)       Balance                                             Pertinent Vitals/Pain Pain Assessment: Faces Faces Pain Scale: Hurts worst Pain Location: right low back with any movement  Pain Descriptors / Indicators: Other (Comment);Crying;Guarding;Moaning(screaming) Pain Intervention(s): Limited activity within patient's tolerance;Monitored during session;Premedicated before session    [place] expects to be discharged to:: Private residence Living Arrangements: Alone Available Help at Discharge: Family;Available PRN/intermittently(son and daughter in law) Type of Home: House Home Access: Stairs to enter Entrance Stairs-Rails: None Entrance Stairs-Number of Steps: 2 Home Layout: Two level;Able to live on main level with bedroom/bathroom Home Equipment: None      Prior Function Level of Independence: Independent               Hand Dominance   Dominant Hand: Right    Extremity/Trunk Assessment   Upper Extremity Assessment Upper Extremity Assessment: Defer to OT evaluation    Lower Extremity Assessment  Lower Extremity Assessment: RLE deficits/detail;LLE deficits/detail RLE Deficits / Details: Ankle dorsiflexion/plantarflexion WFL. Able to perform quad set but unable to actively move RLE into knee flexion, hip flexion, hip abduction/adduction secondary to pain LLE Deficits / Details: Ankle dorsiflexion/plantarflexion WFL. Able to perform quad set but unable to actively move LLE into hip/knee flexion, hip abduction/adduction secondary to pain.        Communication   Communication: No difficulties  Cognition Arousal/Alertness: Awake/alert Behavior During Therapy: WFL for tasks assessed/performed Overall Cognitive Status: Within Functional Limits for tasks assessed                                        General Comments      Exercises Other Exercises Other Exercises: Pain science education as related to back pain and mobility    Assessment/Plan    PT Assessment Patient needs continued PT services  PT Problem List Decreased strength;Decreased activity tolerance;Decreased mobility;Pain       PT Treatment Interventions DME instruction;Gait training;Stair training;Functional mobility training;Therapeutic activities;Therapeutic exercise;Balance training;Patient/family education;Manual techniques    PT Goals (Current goals can be found in the Care Plan section)  Acute Rehab PT Goals Patient Stated Goal: decrease pain PT Goal Formulation: With patient Time For Goal Achievement: 05/14/18 Potential to Achieve Goals: Fair    Frequency Min 3X/week   Barriers to discharge        Co-evaluation               AM-PAC PT "6 Clicks" Daily Activity  Outcome Measure Difficulty turning over in bed (including adjusting bedclothes, sheets and blankets)?: Unable Difficulty moving from lying on back to sitting on the side of the bed? : Unable Difficulty sitting down on and standing up from a chair with arms (e.g., wheelchair, bedside commode, etc,.)?: Unable Help needed moving to and from a bed to chair (including a wheelchair)?: Total Help needed walking in hospital room?: Total Help needed climbing 3-5 steps with a railing? : Total 6 Click Score: 6    End of Session   Activity Tolerance: Patient limited by pain Patient left: in bed;with call bell/phone within reach;with bed alarm set Nurse Communication: Mobility status PT Visit Diagnosis: Other abnormalities of gait and mobility (R26.89);Pain Pain - part of  body: (back)    Time: 6834-1962 PT Time Calculation (min) (ACUTE ONLY): 28 min   Charges:   PT Evaluation $PT Eval Moderate Complexity: 1 Mod PT Treatments $Therapeutic Activity: 8-22 mins   PT G Codes:        Ellamae Sia, PT, DPT Acute Rehabilitation Services  Pager: [place] 04/30/2018, 9:43 AM

## 2018-04-30 NOTE — Progress Notes (Signed)
PROGRESS NOTE   Erika Fry  VEH:[id number]    DOB: 07-Jun-1946    DOA: 04/29/2018  PCP: [name], MD   I have briefly reviewed patients previous medical records in [facility].  Brief Narrative:  72 year old female with PMH of HTN, HLD, diet-controlled DM, GERD, hypothyroid, gout, stage IV chronic kidney disease, iron deficiency anemia, second-degree AV block, recent pacemaker placement 03/25/2018 (Dr. Crissie Sickles, Dr. Adrian Prows, Cardiology) slipped out of her recliner at home, sustained fall and laid on the floor for about 3 hours and presented to ED with acute right lower back pain.  CT and pelvis without acute findings.  Admitted for acute right low back pain with sciatica.   Assessment & Plan:   Principal Problem:   Fall Active Problems:   CKD (chronic kidney disease), stage IV (HCC)   Diet-controlled diabetes mellitus (HCC)   GERD (gastroesophageal reflux disease)   Gout   High cholesterol   Hypothyroidism   Iron deficiency anemia   Essential hypertension   Back pain   Leukocytosis   Hypokalemia   UTI (urinary tract infection)   Acute right low back pain with sciatica: Sustained after mechanical fall from recliner at home.  CT lumbar spine and pelvis without acute findings.  Possibly from bone or muscle bruising/spasm.  No neurological deficits on exam.  Treat supportively with heat pack, pain management, muscle relaxants, PT and OT evaluation.  Patient states that her cardiology team has advised that she should not get an MRI until she is 6 weeks out from pacemaker placement.  If does not improve or worsens, may consider lidocaine patch, steroid pack.  Suspected acute cystitis: Continue IV ceftriaxone pending urine culture results.  Normocytic anemia: Could be from anemia of renal insufficiency, chronic disease versus iron deficiency.  Iron 16, ferritin 204.  Folate 69 and B12 >7500.  Thrombocytopenia, mild: Unclear etiology.  Better.  Follow CBC in  a.m.  Hypokalemia: Replace and follow.  Magnesium normal.  Stage 4 chronic kidney disease: Follows with Dr. Florene Glen, Nephrology.  Presented with creatinine of 3.8 which is increased to 4.3 which may still be within her baseline.  Follow BMP in a.m.  Diet controlled DM 2: A1c 6.3.  Monitor CBGs closely and consider SSI if needed.  Essential hypertension: Soft blood pressures.  Temporarily hold antihypertensives including amlodipine, hydralazine.  Not significantly volume overloaded and hence will hold Lasix for a day as well.  Hyperlipidemia: Continue statins.  Hypothyroid: Continue Synthroid.  Clinically euthyroid.  GERD: PPI.  Status post recent pacemaker placement.  Leukocytosis: Unclear etiology.?  Stress response.  Follow CBC in a.m.   DVT prophylaxis: Subcutaneous heparin Code Status: Full Family Communication: None at bedside Disposition: DC home pending clinical improvement   Consultants:  None  Procedures:  None  Antimicrobials:  None   Subjective: Seen this morning along with PT and OT in the room.  Patient states that she slept well last night after pain was controlled with pain medicines and muscle relaxants.  Sitting up this morning getting ready to work with PT and reports severe right lower back pain, radiating from back up to the leg.  Painfully restricted lower extremity movements but no weakness, tingling or numbness.  No sphincter problems reported.  Denies any other complaints. NPO unclear reasons and eager to have her diet.  ROS: As above.  Objective:  Vitals:   04/30/18 0113 04/30/18 0600 04/30/18 1027 04/30/18 1031  BP: 119/64 93/60 (!) 98/58 (!) 110/52  Pulse:  96 84    Resp: 18     Temp: 98.2 F (36.8 C) 98.6 F (37 C)    TempSrc:      SpO2: 97% 91%    Weight:      Height:        Examination:  General exam: Pleasant elderly female, moderately built and obese, sitting up at edge of bed in mild painful distress. Respiratory system:  Clear to auscultation. Respiratory effort normal. Cardiovascular system: S1 & S2 heard, RRR. No JVD, murmurs, rubs, gallops or clicks. No pedal edema.  Telemetry personally reviewed: V paced rhythm. Gastrointestinal system: Abdomen is nondistended, soft and nontender. No organomegaly or masses felt. Normal bowel sounds heard. Central nervous system: Alert and oriented. No focal neurological deficits. Extremities: Symmetric 5 x 5 power in upper extremities and left lower extremity.  Patient reluctant to move right lower extremity due to low back pain.  At least grade 3 x 5 power. Skin: No rashes, lesions or ulcers Psychiatry: Judgement and insight appear normal. Mood & affect appropriate. Musculoskeletal: Mild right lower lumbar paraspinal muscle tenderness/spasm without deformities.    Data Reviewed: I have personally reviewed following labs and imaging studies  CBC: Recent Labs  Lab 04/29/18 1622 04/29/18 1704 04/30/18 0455  WBC 13.0*  --  24.3*  NEUTROABS 11.6*  --   --   HGB 6.7* 8.5* 8.2*  HCT 20.0* 25.2* 24.5*  MCV 89.7  --  88.4  PLT 122*  --  865*   Basic Metabolic Panel: Recent Labs  Lab 04/29/18 1622 04/29/18 1942 04/30/18 0455  NA 134*  --  135  K 3.2*  --  3.1*  CL 98*  --  98*  CO2 23  --  25  GLUCOSE 151*  --  157*  BUN 69*  --  73*  CREATININE 3.89*  --  4.35*  CALCIUM 8.8*  --  9.0  MG  --  2.0  --    Cardiac Enzymes: Recent Labs  Lab 04/29/18 1622  CKTOTAL 81   HbA1C: Recent Labs    04/30/18 0455  HGBA1C 6.3*   CBG: Recent Labs  Lab 04/30/18 0755  GLUCAP 153*    Recent Results (from the past 240 hour(s))  MRSA PCR Screening     Status: None   Collection Time: 04/30/18  3:35 AM  Result Value Ref Range Status   MRSA by PCR NEGATIVE NEGATIVE Final    Comment:        The GeneXpert MRSA Assay (FDA approved for NASAL specimens only), is one component of a comprehensive MRSA colonization surveillance program. It is not intended to  diagnose MRSA infection nor to guide or monitor treatment for MRSA infections. Performed at [facility], [place] [address], [place], [place] [address]          Radiology Studies: Ct Lumbar Spine Wo Contrast  Result Date: 04/29/2018 CLINICAL DATA:  Right hip pain.  Back pain. EXAM: CT LUMBAR SPINE WITHOUT CONTRAST TECHNIQUE: Multidetector CT imaging of the lumbar spine was performed without intravenous contrast administration. Multiplanar CT image reconstructions were also generated. COMPARISON:  None. FINDINGS: Segmentation: 5 lumbar type vertebrae. Alignment: Normal. Vertebrae: No acute fracture or focal pathologic process. Paraspinal and other soft tissues: Negative. Other: Mild osteoarthritis of bilateral sacroiliac joints. Abdominal aortic atherosclerosis. Disc levels: Mild degenerative disc disease with disc height loss at T11-12 and to lesser extent L1-2, L4-5. T12-L1: Mild broad-based disc bulge. Mild bilateral facet arthropathy. No foraminal stenosis. L1-L2:  Broad-based disc bulge. Mild bilateral facet arthropathy. No foraminal stenosis. L2-L3: Broad-based disc bulge with a small left foraminal disc protrusion contacting the left L2 nerve root. Mild bilateral facet arthropathy. No foraminal stenosis. L3-L4: Broad-based disc bulge eccentric towards the left. Moderate bilateral facet arthropathy. Mild spinal stenosis. No foraminal stenosis. L4-L5: Broad-based disc bulge. Moderate-severe bilateral facet arthropathy. Mild spinal stenosis. No significant foraminal stenosis. L5-S1: Small right paracentral disc protrusion. No foraminal stenosis. Mild bilateral facet arthropathy. IMPRESSION: 1.  No acute osseous injury of the lumbar spine. 2. Diffuse lumbar spine spondylosis as described above. Electronically Signed   By: Kathreen Devoid   On: 04/29/2018 17:57   Ct Pelvis Wo Contrast  Result Date: 04/29/2018 CLINICAL DATA:  Pain post fall EXAM: CT PELVIS WITHOUT CONTRAST TECHNIQUE: Multidetector  CT imaging of the pelvis was performed following the standard protocol without intravenous contrast. COMPARISON:  None. FINDINGS: Urinary Tract:  No abnormality visualized. Bowel:  Unremarkable visualized pelvic bowel loops. Vascular/Lymphatic: Patchy iliofemoral arterial calcifications. No adenopathy. Reproductive:  No mass or other significant abnormality Other: No ascites.  No free air. Musculoskeletal: Degenerative disc disease L4-5. L5 is a transitional segment with left-sided assimilation joint with the pelvis. Negative for fracture or dislocation. Regional soft tissues unremarkable. IMPRESSION: 1. Negative for fracture, dislocation, or other acute finding. 2. Degenerative disc disease L4-5. Electronically Signed   By: Lucrezia Europe M.D.   On: 04/29/2018 17:53        Scheduled Meds: . allopurinol  300 mg Oral Daily  . amLODipine  10 mg Oral Daily  . aspirin EC  81 mg Oral Daily  . famotidine  20 mg Oral QHS  . folic acid  1 mg Oral Daily  . furosemide  160 mg Oral Daily  . heparin  5,000 Units Subcutaneous Q8H  . hydrALAZINE  50 mg Oral TID  . levothyroxine  75 mcg Oral QAC breakfast  . loratadine  10 mg Oral Daily  . multivitamin with minerals  1 tablet Oral Daily  . [START ON 05/01/2018] pneumococcal 23 valent vaccine  0.5 mL Intramuscular Tomorrow-1000  . simvastatin  40 mg Oral Daily   Continuous Infusions: . cefTRIAXone (ROCEPHIN)  IV Stopped (04/30/18 0508)     LOS: 0 days     Vernell Leep, MD, FACP, [facility] Pager [phone]  If 7PM-7AM, please contact night-coverage [email or web address] Password [facility] 04/30/2018, 12:33 PM

## 2018-04-30 NOTE — Evaluation (Signed)
Occupational Therapy Evaluation Patient Details Name: Erika Fry MRN: [id number] DOB: 1946-10-13 Today's Date: 04/30/2018    History of Present Illness Pt. is a 72 y.o. F with significant PMH of hypertension, hyperlipidemia, diet controlled diabetes, GERD, hypothyroidism, gout, CKD-4, iron deficiency anemia, second degree AV block, pacemaker placement, aortic stenosis, who presents after a fall and right lower back pain. CT of lumbar spine and pelvis negative for acute bony fracture.   Clinical Impression   Pt was independent prior to her fall. Presents with severe back pain which radiates down R LE. Pt currently requires 2 person assist for mobility and demonstrates poor sitting and standing balance. She requires moderate to total assist for ADL. Recommending SNF for rehab based on today's performance. Pt may progress to home depending on her pain level and ability to mobilize more comfortably. Will follow acutely.    Follow Up Recommendations  SNF;Supervision/Assistance - 24 hour(depending on progress/pain)    Equipment Recommendations  3 in 1 bedside commode    Recommendations for Other Services       Precautions / Restrictions Precautions Precautions: Fall Precaution Comments: Pacemaker placement 4 weeks ago Restrictions Weight Bearing Restrictions: No      Mobility Bed Mobility Overal bed mobility: Needs Assistance Bed Mobility: Rolling;Sidelying to Sit;Sit to Sidelying Rolling: +2 for physical assistance;Max assist Sidelying to sit: +2 for physical assistance;Max assist     Sit to sidelying: +2 for physical assistance;Total assist General bed mobility comments: cues for log roll technique, increased time, assist to raise trunk and assist LEs back into bed  Transfers Overall transfer level: Needs assistance Equipment used: Rolling walker (2 wheeled) Transfers: Sit to/from Stand Sit to Stand: +2 physical assistance;Mod assist         General transfer  comment: assist to rise and steady, cues for hand placement, stood from elevated bed    Balance Overall balance assessment: Needs assistance Sitting-balance support: Feet supported Sitting balance-Leahy Scale: Poor Sitting balance - Comments: B UE support due to back pain                                   ADL either performed or assessed with clinical judgement   ADL Overall ADL's : Needs assistance/impaired Eating/Feeding: Independent;Bed level   Grooming: Sitting;Minimal assistance   Upper Body Bathing: Moderate assistance;Sitting   Lower Body Bathing: Total assistance;Sit to/from stand;+2 for physical assistance   Upper Body Dressing : Sitting;Moderate assistance   Lower Body Dressing: Total assistance;Sit to/from stand   Toilet Transfer: +2 for physical assistance;Minimal assistance;RW   Toileting- Clothing Manipulation and Hygiene: Total assistance;Sit to/from stand         General ADL Comments: Pt limited by severe back pain.     Vision Baseline Vision/History: Wears glasses Wears Glasses: Reading only Patient Visual Report: No change from baseline       Perception     Praxis      Pertinent Vitals/Pain Pain Assessment: Faces Faces Pain Scale: Hurts worst Pain Location: right low back with any movement  Pain Descriptors / Indicators: Aching;Grimacing;Guarding;Spasm Pain Intervention(s): Monitored during session;Premedicated before session;Repositioned     Hand Dominance Right   Extremity/Trunk Assessment Upper Extremity Assessment Upper Extremity Assessment: Overall WFL for tasks assessed   Lower Extremity Assessment Lower Extremity Assessment: Defer to PT evaluation   Cervical / Trunk Assessment Cervical / Trunk Assessment: Other exceptions Cervical / Trunk Exceptions: pain R lower side  Communication Communication Communication: No difficulties   Cognition Arousal/Alertness: Awake/alert Behavior During Therapy: WFL for tasks  assessed/performed Overall Cognitive Status: Within Functional Limits for tasks assessed                                     General Comments       Exercises     Shoulder Instructions      Home Living Family/patient expects to be discharged to:: Private residence Living Arrangements: Alone Available Help at Discharge: Family;Available PRN/intermittently Type of Home: House Home Access: Stairs to enter [employer] of Steps: 2 Entrance Stairs-Rails: None Home Layout: Two level;Able to live on main level with bedroom/bathroom     Bathroom Shower/Tub: Walk-in shower         Home Equipment: None          Prior Functioning/Environment Level of Independence: Independent                 OT Problem List: Decreased strength;Decreased activity tolerance;Impaired balance (sitting and/or standing);Decreased knowledge of use of DME or AE;Increased edema      OT Treatment/Interventions: Self-care/ADL training;DME and/or AE instruction;Therapeutic activities;Patient/family education;Balance training    OT Goals(Current goals can be found in the care plan section) Acute Rehab OT Goals Patient Stated Goal: decrease pain OT Goal Formulation: With patient Time For Goal Achievement: 05/14/18 Potential to Achieve Goals: Good ADL Goals Pt Will Perform Grooming: with min guard assist;standing Pt Will Perform Lower Body Bathing: with min guard assist;with adaptive equipment;sit to/from stand Pt Will Perform Lower Body Dressing: with min guard assist;sit to/from stand;with adaptive equipment Pt Will Transfer to Toilet: with min assist;ambulating;bedside commode Pt Will Perform Toileting - Clothing Manipulation and hygiene: with min guard assist;sit to/from stand Additional ADL Goal #1: Pt will perform bed mobility with min assist using log roll technique for comfort.  OT Frequency: Min 2X/week   Barriers to D/C:            Co-evaluation PT/OT/SLP  Co-Evaluation/Treatment: Yes Reason for Co-Treatment: For patient/therapist safety   OT goals addressed during session: ADL's and self-care      AM-PAC PT "6 Clicks" Daily Activity     Outcome Measure Help from another person eating meals?: None Help from another person taking care of personal grooming?: A Lot Help from another person toileting, which includes using toliet, bedpan, or urinal?: Total Help from another person bathing (including washing, rinsing, drying)?: A Lot Help from another person to put on and taking off regular upper body clothing?: A Lot Help from another person to put on and taking off regular lower body clothing?: Total 6 Click Score: 12   End of Session Equipment Utilized During Treatment: Gait belt;Rolling walker Nurse Communication: Other (comment)(MD ok to d/c NPO)  Activity Tolerance: Patient limited by pain Patient left: in bed;with call bell/phone within reach;with bed alarm set  OT Visit Diagnosis: Unsteadiness on feet (R26.81);Pain                Time: 1275-1700 OT Time Calculation (min): 35 min Charges:  OT General Charges $OT Visit: 1 Visit OT Evaluation $OT Eval Moderate Complexity: 1 Mod G-Codes:     22-May-2018 Nestor Lewandowsky, OTR/L Pager: [phone]  Werner Lean, Haze Boyden May 22, 2018, 1:14 PM

## 2018-04-30 NOTE — Progress Notes (Signed)
Physical Therapy Treatment Patient Details Name: Erika Fry MRN: [id number] DOB: 1946/06/20 Today's Date: 04/30/2018    History of Present Illness Pt. is a 72 y.o. F with significant PMH of hypertension, hyperlipidemia, diet controlled diabetes, GERD, hypothyroidism, gout, CKD-4, iron deficiency anemia, second degree AV block, pacemaker placement, aortic stenosis, who presents after a fall and right lower back pain. CT of lumbar spine and pelvis negative for acute bony fracture.    PT Comments    Patient seen for additional session with Occupational Therapy to progress mobility (morning evaluation extremely limited by pain). Continuing to require maximal-total two person assistance for bed mobility but able to progress to standing with moderate assistance and side stepping at edge of bed with RW. Patient is requiring increased assistance mainly secondary to pain rather than true weakness. However, patient does not have adequate caregiver support for discharge home currently and therefore will benefit from short term rehab to maximize functional independence.    Follow Up Recommendations  SNF     Equipment Recommendations  Rolling walker with 5" wheels;3in1 (PT)    Recommendations for Other Services       Precautions / Restrictions Precautions Precautions: Fall Precaution Comments: Pacemaker placement 4 weeks ago Restrictions Weight Bearing Restrictions: No    Mobility  Bed Mobility Overal bed mobility: Needs Assistance Bed Mobility: Rolling;Sidelying to Sit;Sit to Sidelying Rolling: +2 for physical assistance;Max assist Sidelying to sit: +2 for physical assistance;Max assist     Sit to sidelying: +2 for physical assistance;Total assist General bed mobility comments: cues for log roll technique, increased time, assist to raise trunk and assist LEs back into bed  Transfers Overall transfer level: Needs assistance Equipment used: Rolling walker (2 wheeled) Transfers:  Sit to/from Stand Sit to Stand: +2 physical assistance;Mod assist         General transfer comment: assist to rise and steady, cues for hand placement, stood from elevated bed  Ambulation/Gait Ambulation/Gait assistance: Min assist;+2 safety/equipment   Assistive device: Rolling walker (2 wheeled) Gait Pattern/deviations: Trunk flexed     General Gait Details: Patient able to take several side steps towards head of bed with min assist for managing RW. Cueing for upright posture.   Stairs             Wheelchair Mobility    Modified Rankin (Stroke Patients Only)       Balance Overall balance assessment: Needs assistance Sitting-balance support: Feet supported Sitting balance-Leahy Scale: Poor Sitting balance - Comments: B UE support due to back pain   Standing balance support: Bilateral upper extremity supported Standing balance-Leahy Scale: Poor Standing balance comment: reliant on UE support                            Cognition Arousal/Alertness: Awake/alert Behavior During Therapy: WFL for tasks assessed/performed Overall Cognitive Status: Within Functional Limits for tasks assessed                                        Exercises      General Comments        Pertinent Vitals/Pain Pain Assessment: Faces Faces Pain Scale: Hurts worst Pain Location: right low back with any movement  Pain Descriptors / Indicators: Aching;Grimacing;Guarding;Spasm Pain Intervention(s): Monitored during session;Premedicated before session;Repositioned;Limited activity within patient's tolerance    Home Living Family/patient expects to be discharged to::  Private residence Living Arrangements: Alone Available Help at Discharge: Family;Available PRN/intermittently Type of Home: House Home Access: Stairs to enter Entrance Stairs-Rails: None Home Layout: Two level;Able to live on main level with bedroom/bathroom Home Equipment: None       Prior Function Level of Independence: Independent          PT Goals (current goals can now be found in the care plan section) Acute Rehab PT Goals Patient Stated Goal: decrease pain PT Goal Formulation: With patient Time For Goal Achievement: 05/14/18 Potential to Achieve Goals: Fair Progress towards PT goals: Progressing toward goals    Frequency    Min 3X/week      PT Plan Current plan remains appropriate    Co-evaluation PT/OT/SLP Co-Evaluation/Treatment: Yes Reason for Co-Treatment: For patient/therapist safety;To address functional/ADL transfers PT goals addressed during session: Mobility/safety with mobility OT goals addressed during session: ADL's and self-care      AM-PAC PT "6 Clicks" Daily Activity  Outcome Measure  Difficulty turning over in bed (including adjusting bedclothes, sheets and blankets)?: Unable Difficulty moving from lying on back to sitting on the side of the bed? : Unable Difficulty sitting down on and standing up from a chair with arms (e.g., wheelchair, bedside commode, etc,.)?: Unable Help needed moving to and from a bed to chair (including a wheelchair)?: A Little Help needed walking in hospital room?: A Lot Help needed climbing 3-5 steps with a railing? : Total 6 Click Score: 9    End of Session Equipment Utilized During Treatment: Gait belt Activity Tolerance: Patient limited by pain Patient left: in bed;with call bell/phone within reach;with bed alarm set Nurse Communication: Mobility status PT Visit Diagnosis: Other abnormalities of gait and mobility (R26.89);Pain     Time: 1050-1120 PT Time Calculation (min) (ACUTE ONLY): 30 min  Charges:  $Therapeutic Activity: 8-22 mins                    G Codes:       Erika Fry, PT, DPT Acute Rehabilitation Services  Pager: [phone]    Erika Fry 04/30/2018, 3:24 PM

## 2018-05-01 DIAGNOSIS — N189 Chronic kidney disease, unspecified: Secondary | ICD-10-CM | POA: Diagnosis not present

## 2018-05-01 DIAGNOSIS — N3000 Acute cystitis without hematuria: Secondary | ICD-10-CM | POA: Diagnosis not present

## 2018-05-01 DIAGNOSIS — D696 Thrombocytopenia, unspecified: Secondary | ICD-10-CM | POA: Diagnosis not present

## 2018-05-01 DIAGNOSIS — N179 Acute kidney failure, unspecified: Secondary | ICD-10-CM

## 2018-05-01 DIAGNOSIS — M5441 Lumbago with sciatica, right side: Secondary | ICD-10-CM | POA: Diagnosis not present

## 2018-05-01 DIAGNOSIS — E119 Type 2 diabetes mellitus without complications: Secondary | ICD-10-CM | POA: Diagnosis not present

## 2018-05-01 DIAGNOSIS — D72829 Elevated white blood cell count, unspecified: Secondary | ICD-10-CM | POA: Diagnosis not present

## 2018-05-01 DIAGNOSIS — E039 Hypothyroidism, unspecified: Secondary | ICD-10-CM | POA: Diagnosis not present

## 2018-05-01 DIAGNOSIS — I10 Essential (primary) hypertension: Secondary | ICD-10-CM | POA: Diagnosis not present

## 2018-05-01 LAB — BASIC METABOLIC PANEL
Anion gap: 15 (ref 5–15)
Anion gap: 18 — ABNORMAL HIGH (ref 5–15)
BUN: 88 mg/dL — ABNORMAL HIGH (ref 6–20)
BUN: 94 mg/dL — ABNORMAL HIGH (ref 6–20)
CALCIUM: 8.5 mg/dL — AB (ref 8.9–10.3)
CHLORIDE: 94 mmol/L — AB (ref 101–111)
CO2: 24 mmol/L (ref 22–32)
CO2: 22 mmol/L (ref 22–32)
CREATININE: 5.4 mg/dL — AB (ref 0.44–1.00)
CREATININE: 5.32 mg/dL — AB (ref 0.44–1.00)
Calcium: 8.4 mg/dL — ABNORMAL LOW (ref 8.9–10.3)
Chloride: 93 mmol/L — ABNORMAL LOW (ref 101–111)
GFR calc Af Amer: 8 mL/min — ABNORMAL LOW (ref >60)
GFR calc non Af Amer: 7 mL/min — ABNORMAL LOW (ref >60)
GFR calc non Af Amer: 7 mL/min — ABNORMAL LOW (ref >60)
GFR, EST AFRICAN AMERICAN: 8 mL/min — AB (ref >60)
GLUCOSE: 181 mg/dL — AB (ref 65–99)
Glucose, Bld: 147 mg/dL — ABNORMAL HIGH (ref 65–99)
Potassium: 4.2 mmol/L (ref 3.5–5.1)
Potassium: 4 mmol/L (ref 3.5–5.1)
SODIUM: 134 mmol/L — AB (ref 135–145)
Sodium: 132 mmol/L — ABNORMAL LOW (ref 135–145)

## 2018-05-01 LAB — CBC
HCT: 24.5 % — ABNORMAL LOW (ref 36.0–46.0)
HEMATOCRIT: 22.5 % — AB (ref 36.0–46.0)
HEMOGLOBIN: 8.3 g/dL — AB (ref 12.0–15.0)
Hemoglobin: 7.5 g/dL — ABNORMAL LOW (ref 12.0–15.0)
MCH: 29.3 pg (ref 26.0–34.0)
MCH: 29.5 pg (ref 26.0–34.0)
MCHC: 33.3 g/dL (ref 30.0–36.0)
MCHC: 33.9 g/dL (ref 30.0–36.0)
MCV: 87.9 fL (ref 78.0–100.0)
MCV: 87.2 fL (ref 78.0–100.0)
Platelets: 93 10*3/uL — ABNORMAL LOW (ref 150–400)
Platelets: 98 10*3/uL — ABNORMAL LOW (ref 150–400)
RBC: 2.56 MIL/uL — ABNORMAL LOW (ref 3.87–5.11)
RBC: 2.81 MIL/uL — ABNORMAL LOW (ref 3.87–5.11)
RDW: 13.2 % (ref 11.5–15.5)
RDW: 13.3 % (ref 11.5–15.5)
WBC: 25.8 10*3/uL — ABNORMAL HIGH (ref 4.0–10.5)
WBC: 18 10*3/uL — ABNORMAL HIGH (ref 4.0–10.5)

## 2018-05-01 LAB — SAVE SMEAR

## 2018-05-01 LAB — GLUCOSE, CAPILLARY
GLUCOSE-CAPILLARY: 140 mg/dL — AB (ref 65–99)
GLUCOSE-CAPILLARY: 191 mg/dL — AB (ref 65–99)
Glucose-Capillary: 161 mg/dL — ABNORMAL HIGH (ref 65–99)

## 2018-05-01 MED ORDER — METHYLPREDNISOLONE 4 MG PO TBPK
8.0000 mg | ORAL_TABLET | Freq: Every morning | ORAL | Status: AC
  Filled 2018-05-01: qty 21

## 2018-05-01 MED ORDER — METHYLPREDNISOLONE 4 MG PO TBPK: 4.0000 mg | ORAL_TABLET | ORAL | Status: AC

## 2018-05-01 MED ORDER — INSULIN ASPART 100 UNIT/ML ~~LOC~~ SOLN
0.0000 [IU] | Freq: Three times a day (TID) | SUBCUTANEOUS | Status: DC
  Administered 2018-05-02: 3 [IU] via SUBCUTANEOUS
  Administered 2018-05-02 (×2): 2 [IU] via SUBCUTANEOUS
  Administered 2018-05-03: 3 [IU] via SUBCUTANEOUS

## 2018-05-01 MED ORDER — METHYLPREDNISOLONE 4 MG PO TBPK
8.0000 mg | ORAL_TABLET | Freq: Every evening | ORAL | Status: AC
  Administered 2018-05-02: 8 mg via ORAL

## 2018-05-01 MED ORDER — METHYLPREDNISOLONE 4 MG PO TBPK
8.0000 mg | ORAL_TABLET | Freq: Every evening | ORAL | Status: AC
Start: 2018-05-01 — End: 2018-05-02

## 2018-05-01 MED ORDER — METHYLPREDNISOLONE 4 MG PO TBPK
4.0000 mg | ORAL_TABLET | Freq: Three times a day (TID) | ORAL | Status: AC
  Administered 2018-05-02 (×3): 4 mg via ORAL

## 2018-05-01 MED ORDER — METHYLPREDNISOLONE 4 MG PO TBPK
4.0000 mg | ORAL_TABLET | Freq: Four times a day (QID) | ORAL | Status: DC
  Administered 2018-05-03 – 2018-05-05 (×8): 4 mg via ORAL
  Filled 2018-05-01: qty 21

## 2018-05-01 MED ORDER — SODIUM CHLORIDE 0.9 % IV SOLN
INTRAVENOUS | Status: AC
  Administered 2018-05-01: 22:00:00 via INTRAVENOUS

## 2018-05-01 MED ORDER — LIDOCAINE 5 % EX PTCH
1.0000 | MEDICATED_PATCH | CUTANEOUS | Status: DC
  Administered 2018-05-01 – 2018-05-05 (×5): 1 via TRANSDERMAL
  Filled 2018-05-01 (×5): qty 1

## 2018-05-01 NOTE — Progress Notes (Signed)
I have evaluated the pacemaker interrogation, patient is pacer dependent.  It was placed 03/25/2018, would recommend not performing MRI at this time.  As it is not emergent, would defer MRI for at least additional 1 to 2 weeks minimum if possible.  MRI can be performed at 6 weeks post implantation. Dr. Algis Liming was contacted and made aware as well and MRI cancelled.    Adrian Prows, MD 05/01/2018, 7:17 PM [facility]. [place] Pager: [phone] Office: [phone] If no answer: Cell:  [phone]

## 2018-05-01 NOTE — Clinical Social Work Note (Signed)
Clinical Social Work Assessment  Patient Details  Name: Erika Fry MRN: [id number] Date of Birth: 1946/04/29  Date of referral:  05/01/18               Reason for consult:  Facility Placement                Permission sought to share information with:  Facility Sport and exercise psychologist, Family Supports Permission granted to share information::  Yes, Verbal Permission Granted  Name::        Agency::  SNFs  Relationship::     Contact Information:     Housing/Transportation Living arrangements for the past 2 months:  Single Family Home Source of Information:  Patient Patient Interpreter Needed:  None Criminal Activity/Legal Involvement Pertinent to Current Situation/Hospitalization:  No - Comment as needed Significant Relationships:  Adult Children Lives with:  Self Do you feel safe going back to the place where you live?  No Need for family participation in patient care:  No (Coment)  Care giving concerns:  CSW received consult for possible SNF placement at time of discharge. CSW spoke with patient regarding PT recommendation of SNF placement at time of discharge. Patient reported that she lives alone and has no one to care for her. Patient expressed understanding of PT recommendation and is agreeable to SNF placement at time of discharge. CSW to continue to follow and assist with discharge planning needs.   Social Worker assessment / plan:  CSW spoke with patient concerning possibility of rehab at [facility] before returning home.  Employment status:  Retired Nurse, adult PT Recommendations:  [place] / Referral to community resources:  [place]  Patient/Family's Response to care:  Patient recognizes need for rehab before returning home and is agreeable to a SNF in [place]. Patient reported preference for [facility]. She reports understanding of insurance authorization process. Her 3 sons and  daughter-in-laws are very supportive of her.   Patient/Family's Understanding of and Emotional Response to Diagnosis, Current Treatment, and Prognosis:  Patient/family is realistic regarding therapy needs and expressed being hopeful for SNF placement. Patient expressed understanding of CSW role and discharge process as well as medical condition. No questions/concerns about plan or treatment.    Emotional Assessment Appearance:  Appears stated age Attitude/Demeanor/Rapport:  Engaged Affect (typically observed):  Accepting, Appropriate Orientation:  Oriented to Self, Oriented to Place, Oriented to  Time, Oriented to Situation Alcohol / Substance use:  Not Applicable Psych involvement (Current and /or in the community):  No (Comment)  Discharge Needs  Concerns to be addressed:  Care Coordination Readmission within the last 30 days:  Yes Current discharge risk:  Dependent with Mobility, Lives alone Barriers to Discharge:  Continued Medical Work up   [employer], [place] 05/01/2018, 11:52 AM

## 2018-05-01 NOTE — Progress Notes (Signed)
Entered room during shift change and pt family concerned about decreased responsiveness from patient. Obtained vitals and patient's O2 dropping into upper 80's. Placed on 2L O2 via nasal cannula and obtained CBG which was normal. Paged [place], NP and received new orders for labs. Will continue to monitor patient.

## 2018-05-01 NOTE — NC FL2 (Signed)
[place] MEDICAID FL2 LEVEL OF CARE SCREENING TOOL     IDENTIFICATION  Patient Name: Erika Fry Birthdate: [birth year] Sex: female Admission Date (Current Location): 04/29/2018  [facility] and [place] Number:  Herbalist and Address:  [facility], [place] [address] [place], [place] [address]      Provider Number: [id number]  Attending Physician Name and Address:  Modena Jansky, MD  Relative Name and Phone Number:  Anderson [place] Daughter-in-law, [phone]    Current Level of Care: Hospital Recommended Level of Care: [place] Prior Approval Number:    Date Approved/Denied:   PASRR Number: [id number]  Discharge Plan: SNF    Current Diagnoses: Patient Active Problem List   Diagnosis Date Noted  . UTI (urinary tract infection) 04/30/2018  . Essential hypertension 04/29/2018  . Fall 04/29/2018  . Back pain 04/29/2018  . Leukocytosis 04/29/2018  . Hypokalemia 04/29/2018  . CKD (chronic kidney disease), stage IV ([place])   . Diet-controlled diabetes mellitus ([place])   . GERD (gastroesophageal reflux disease)   . Gout   . High cholesterol   . Hypothyroidism   . Iron deficiency anemia   . AV block, Mobitz 2 03/25/2018  . Mobitz type 2 second degree AV block 03/20/2018  . Aortic stenosis 03/20/2018  . Exertional dyspnea 03/19/2018    Orientation RESPIRATION BLADDER Height & Weight     Self, Time, Situation, Place  Normal Incontinent, External catheter Weight: 90.7 kg (200 lb) Height:  5\' 5"  (165.1 cm)  BEHAVIORAL SYMPTOMS/MOOD NEUROLOGICAL BOWEL NUTRITION STATUS      Continent Diet(Please see DC Summary)  AMBULATORY STATUS COMMUNICATION OF NEEDS Skin   Extensive Assist Verbally Normal                       Personal Care Assistance Level of Assistance  Bathing, Feeding, Dressing Bathing Assistance: Maximum assistance Feeding assistance: Limited assistance Dressing Assistance: Limited assistance      Functional Limitations Info  Sight, Hearing, Speech Sight Info: Adequate Hearing Info: Adequate Speech Info: Adequate    SPECIAL CARE FACTORS FREQUENCY  PT (By licensed PT), OT (By licensed OT)     PT Frequency: 5x/week OT Frequency: 3x/week            Contractures      Additional Factors Info  Code Status, Allergies Code Status Info: Full Allergies Info: Codeine           Current Medications (05/01/2018):  This is the current hospital active medication list Current Facility-Administered Medications  Medication Dose Route Frequency Provider Last Rate Last Dose  . acetaminophen (TYLENOL) tablet 500 mg  500 mg Oral TID Modena Jansky, MD   500 mg at 05/01/18 0856  . allopurinol (ZYLOPRIM) tablet 300 mg  300 mg Oral Daily Ivor Costa, MD   300 mg at 05/01/18 0857  . aspirin EC tablet 81 mg  81 mg Oral Daily Ivor Costa, MD   81 mg at 05/01/18 0857  . cefTRIAXone (ROCEPHIN) 1 g in sodium chloride 0.9 % 100 mL IVPB  1 g Intravenous Q24H Ivor Costa, MD   Stopped at 05/01/18 0345  . diclofenac sodium (VOLTAREN) 1 % transdermal gel 4 g  4 g Topical QID Modena Jansky, MD   4 g at 05/01/18 1348  . famotidine (PEPCID) tablet 20 mg  20 mg Oral QHS Ivor Costa, MD   20 mg at 04/30/18 2131  . fluticasone (FLONASE) 50 MCG/ACT nasal  spray 2 spray  2 spray Each Nare Daily PRN Ivor Costa, MD      . folic acid (FOLVITE) tablet 1 mg  1 mg Oral Daily Ivor Costa, MD   1 mg at 05/01/18 0855  . heparin injection 5,000 Units  5,000 Units Subcutaneous Cleophas Dunker, MD   5,000 Units at 05/01/18 1342  . hydrALAZINE (APRESOLINE) injection 5 mg  5 mg Intravenous Q2H PRN Ivor Costa, MD      . levothyroxine (SYNTHROID, LEVOTHROID) tablet 75 mcg  75 mcg Oral QAC breakfast Ivor Costa, MD   75 mcg at 05/01/18 0856  . lidocaine (LIDODERM) 5 % 1 patch  1 patch Transdermal Q24H Hongalgi, Anand D, MD      . loratadine (CLARITIN) tablet 10 mg  10 mg Oral Daily Ivor Costa, MD   10 mg at 05/01/18 0857  .  methocarbamol (ROBAXIN) tablet 500 mg  500 mg Oral Q8H PRN Ivor Costa, MD   500 mg at 04/30/18 1801  . morphine 4 MG/ML injection 2 mg  2 mg Intravenous Q3H PRN Ivor Costa, MD   2 mg at 04/30/18 0714  . multivitamin with minerals tablet 1 tablet  1 tablet Oral Daily Ivor Costa, MD   1 tablet at 05/01/18 0855  . ondansetron (ZOFRAN) tablet 4 mg  4 mg Oral Q6H PRN Ivor Costa, MD       Or  . ondansetron [facility]) injection 4 mg  4 mg Intravenous Q6H PRN Ivor Costa, MD      . oxyCODONE-acetaminophen (PERCOCET/ROXICET) 5-325 MG per tablet 1 tablet  1 tablet Oral Q4H PRN Ivor Costa, MD   1 tablet at 05/01/18 1348  . polyethylene glycol (MIRALAX / GLYCOLAX) packet 17 g  17 g Oral Daily PRN Ivor Costa, MD      . simvastatin (ZOCOR) tablet 40 mg  40 mg Oral Daily Ivor Costa, MD   40 mg at 05/01/18 0856  . zolpidem (AMBIEN) tablet 5 mg  5 mg Oral QHS PRN Ivor Costa, MD         Discharge Medications: Please see discharge summary for a list of discharge medications.  Relevant Imaging Results:  Relevant Lab Results:   Additional Information SSn: [place], [place]

## 2018-05-01 NOTE — Progress Notes (Signed)
CSW unable to get a response from [facility]. Patient reported second choice for [facility]. They will see if they can get a private room for patient over the weekend.   Percell Locus Chimere Klingensmith LCSW [phone]

## 2018-05-01 NOTE — Progress Notes (Addendum)
PROGRESS NOTE   Erika Fry  ZOX:[id number]    DOB: [birth year]    DOA: 04/29/2018  PCP: [name], MD   I have briefly reviewed patients previous medical [facility] in [facility].  Brief Narrative:  72 year old female with PMH of HTN, HLD, diet-controlled DM, GERD, hypothyroid, gout, stage IV chronic kidney disease, iron deficiency anemia, second-degree AV block, recent pacemaker placement 03/25/2018 (Dr. Crissie Sickles, Dr. Adrian Prows, Cardiology) slipped out of her recliner at home, sustained fall and laid on the floor for about 3 hours and presented to ED with acute right lower back pain.  CT and pelvis without acute findings.  Admitted for acute right low back pain with sciatica. Ongoing back pain without much relief. Unable to do MRI due to pacer dependent per d/w Cardiology.   Assessment & Plan:   Principal Problem:   Fall Active Problems:   CKD (chronic kidney disease), stage IV (HCC)   Diet-controlled diabetes mellitus (HCC)   GERD (gastroesophageal reflux disease)   Gout   High cholesterol   Hypothyroidism   Iron deficiency anemia   Essential hypertension   Back pain   Leukocytosis   Hypokalemia   UTI (urinary tract infection)   Acute right low back pain with sciatica: Sustained after mechanical fall from recliner at home.  CT lumbar spine and pelvis without acute findings.  Possibly from bone or muscle bruising/spasm.  No neurological deficits on exam.  Despite treating supportively with heat pack, pain management with opioids, muscle relaxants, lidocaine, ongoing significant pain without relief.  Discussed in detail with primary cardiologist who initially advised to go ahead and order MRI but subsequently interrogated PM and noted to be pacemaker dependent and advised canceling MRI-canceled and discussed with RN.  Started Medrol Dosepak.  If does not improve, consider orthopedic consultation.    I discussed with neurosurgeon on-call on 5/31.  I reviewed the case  with him and he was able to see patients admission CT images.  He recommended continue current management including steroids, rest, PT, attempt to improve renal functions and if does not get better then may need to consider CT myelogram next week with minimal contrast.  Suspected E. coli acute cystitis: Continue IV ceftriaxone pending final sensitivities.  Normocytic anemia: Could be from anemia of renal insufficiency, chronic disease versus iron deficiency.  Iron 16, ferritin 204.  Folate 69 and B12 >7500.  Hemoglobin has dropped from 8.2-7.5 in the absence of overt bleeding.  Follow CBC in a.m.  Thrombocytopenia, mild: Unclear etiology.  Platelet count seems to have improved yesterday but again has dropped from 135-93.  DC heparin DVT prophylaxis, start SCDs and follow CBC in a.m.  If continues to drop, may need further evaluation.  Hypokalemia: Replaced..  Magnesium normal.  Acute on stage 4 chronic kidney disease: Follows with Dr. Florene Glen, Nephrology.  Presented with creatinine of 3.8 which increased to 4.3 and then to 5.4.  Could be related to poor oral intake or hemodynamic/soft blood pressures.  Not on ACEI/ARB or NSAIDs.  Brief and gentle IV fluids and follow BMP in a.m. if renal functions do not improve or worsen, consult nephrology in a.m.  Diet controlled DM 2: A1c 6.3.  Monitor CBGs closely and consider SSI if needed.  Monitor closely while steroid dose pack initiated.  Essential hypertension: Soft blood pressures.  Temporarily hold antihypertensives including amlodipine, hydralazine.  Not significantly volume overloaded and hence will hold Lasix for a day as well.  Ongoing soft blood pressures.  Brief IV fluids.  Hyperlipidemia: Continue statins.  Hypothyroid: Continue Synthroid.  Clinically euthyroid.  GERD: PPI.  Status post recent pacemaker placement.  Leukocytosis: Unclear etiology.Progressively increasing 13>24>25.8. UTI being treated with IV Ceftriaxone. No diarrhea or other  symptoms suggestive of infection were reported. No likely to worsen with steroids. F/W CBC with Diff and smear in am.   DVT prophylaxis: Subcutaneous heparin - DC'ed. SCD's Code Status: Full Family Communication: I discussed in detail with patient's daughter-in-law, updated care and answered questions. Disposition: Not medically stable for DC.   Consultants:  None  Procedures:  None  Antimicrobials:  None   Subjective: Ongoing severe right low back pain, not much radiation to leg today, better when she lies still or has taken pain medications but worse with minimal movements.  Denies tingling, numbness or weakness in lower extremities.  Denies sphincter disturbances.  Discussed with RN and above confirmed.  ROS: As above.  Objective:  Vitals:   04/30/18 2048 04/30/18 2148 05/01/18 0603 05/01/18 0605  BP: 103/60 104/62 (!) 95/56 (!) 95/56  Pulse: 87 86 99 99  Resp: 20 18  18   Temp: 97.6 F (36.4 C) 97.9 F (36.6 C) 98.1 F (36.7 C) 98.1 F (36.7 C)  TempSrc: Oral     SpO2: 95% 94% 94% 94%  Weight:      Height:        Examination:  General exam: Pleasant elderly female, moderately built and obese, lying uncomfortably supine in bed. Respiratory system: Clear to auscultation. Respiratory effort normal.  Stable. Cardiovascular system: S1 & S2 heard, RRR. No JVD, murmurs, rubs, gallops or clicks. No pedal edema.  Telemetry personally reviewed: V Paced rhythm. Gastrointestinal system: Abdomen is nondistended, soft and nontender. No organomegaly or masses felt. Normal bowel sounds heard. Stable.  Central nervous system: Alert and oriented. No focal neurological deficits. Extremities: Symmetric 5 x 5 power in upper extremities and left lower extremity.  Seems to have normal power in lower extremity but limited assessment due to pain.  At least 4 x 5 power in both lower extremities. Skin: No rashes, lesions or ulcers Psychiatry: Judgement and insight appear normal. Mood &  affect appropriate. Musculoskeletal: Mild right lower lumbar paraspinal muscle tenderness/spasm without deformities.  Not examined today.    Data Reviewed: I have personally reviewed following labs and imaging studies  CBC: Recent Labs  Lab 04/29/18 1622 04/29/18 1704 04/30/18 0455 05/01/18 0759  WBC 13.0*  --  24.3* 25.8*  NEUTROABS 11.6*  --   --   --   HGB 6.7* 8.5* 8.2* 7.5*  HCT 20.0* 25.2* 24.5* 22.5*  MCV 89.7  --  88.4 87.9  PLT 122*  --  135* 93*   Basic Metabolic Panel: Recent Labs  Lab 04/29/18 1622 04/29/18 1942 04/30/18 0455 05/01/18 0759  NA 134*  --  135 132*  K 3.2*  --  3.1* 4.2  CL 98*  --  98* 93*  CO2 23  --  25 24  GLUCOSE 151*  --  157* 181*  BUN 69*  --  73* 88*  CREATININE 3.89*  --  4.35* 5.40*  CALCIUM 8.8*  --  9.0 8.4*  MG  --  2.0  --   --    Cardiac Enzymes: Recent Labs  Lab 04/29/18 1622  CKTOTAL 81   HbA1C: Recent Labs    04/30/18 0455  HGBA1C 6.3*   CBG: Recent Labs  Lab 04/30/18 0755 05/01/18 0820  GLUCAP 153* 161*  Recent Results (from the past 240 hour(s))  Urine Culture     Status: Abnormal (Preliminary result)   Collection Time: 04/30/18  3:17 AM  Result Value Ref Range Status   Specimen Description URINE, RANDOM  Final   Special Requests NONE  Final   Culture (A)  Final    >=100,000 COLONIES/mL ESCHERICHIA COLI SUSCEPTIBILITIES TO FOLLOW Performed at [facility], [address], [place], [place] [address]    Report Status PENDING  Incomplete  MRSA PCR Screening     Status: None   Collection Time: 04/30/18  3:35 AM  Result Value Ref Range Status   MRSA by PCR NEGATIVE NEGATIVE Final    Comment:        The GeneXpert MRSA Assay (FDA approved for NASAL specimens only), is one component of a comprehensive MRSA colonization surveillance program. It is not intended to diagnose MRSA infection nor to guide or monitor treatment for MRSA infections. Performed at [facility], [place]  [address] [place], [place] [address]   Culture, blood (Routine X 2) w Reflex to ID Panel     Status: None (Preliminary result)   Collection Time: 04/30/18  4:55 AM  Result Value Ref Range Status   Specimen Description BLOOD RIGHT ANTECUBITAL  Final   Special Requests   Final    BOTTLES DRAWN AEROBIC AND ANAEROBIC Blood Culture adequate volume   Culture   Final    NO GROWTH 1 DAY Performed at [facility], [place] [address], [place], [place] [address]    Report Status PENDING  Incomplete  Culture, blood (Routine X 2) w Reflex to ID Panel     Status: None (Preliminary result)   Collection Time: 04/30/18  6:33 AM  Result Value Ref Range Status   Specimen Description BLOOD RIGHT HAND  Final   Special Requests   Final    BOTTLES DRAWN AEROBIC ONLY Blood Culture adequate volume   Culture   Final    NO GROWTH 1 DAY Performed at [facility], [place] [address] [place], [place] [address]    Report Status PENDING  Incomplete         Radiology Studies: No results found.      Scheduled Meds: . acetaminophen  500 mg Oral TID  . allopurinol  300 mg Oral Daily  . aspirin EC  81 mg Oral Daily  . diclofenac sodium  4 g Topical QID  . famotidine  20 mg Oral QHS  . folic acid  1 mg Oral Daily  . heparin  5,000 Units Subcutaneous Q8H  . levothyroxine  75 mcg Oral QAC breakfast  . lidocaine  1 patch Transdermal Q24H  . loratadine  10 mg Oral Daily  . multivitamin with minerals  1 tablet Oral Daily  . simvastatin  40 mg Oral Daily   Continuous Infusions: . cefTRIAXone (ROCEPHIN)  IV Stopped (05/01/18 0345)     LOS: 0 days     Vernell Leep, MD, FACP, [facility]. [facility] Pager [phone]  If 7PM-7AM, please contact night-coverage [email or web address] Password [facility] 05/01/2018, 6:08 PM

## 2018-05-02 DIAGNOSIS — N3000 Acute cystitis without hematuria: Secondary | ICD-10-CM | POA: Diagnosis present

## 2018-05-02 DIAGNOSIS — E871 Hypo-osmolality and hyponatremia: Secondary | ICD-10-CM | POA: Diagnosis not present

## 2018-05-02 DIAGNOSIS — E039 Hypothyroidism, unspecified: Secondary | ICD-10-CM

## 2018-05-02 DIAGNOSIS — E669 Obesity, unspecified: Secondary | ICD-10-CM | POA: Diagnosis present

## 2018-05-02 DIAGNOSIS — I959 Hypotension, unspecified: Secondary | ICD-10-CM | POA: Diagnosis not present

## 2018-05-02 DIAGNOSIS — E119 Type 2 diabetes mellitus without complications: Secondary | ICD-10-CM | POA: Diagnosis not present

## 2018-05-02 DIAGNOSIS — M109 Gout, unspecified: Secondary | ICD-10-CM | POA: Diagnosis present

## 2018-05-02 DIAGNOSIS — M5440 Lumbago with sciatica, unspecified side: Secondary | ICD-10-CM | POA: Diagnosis present

## 2018-05-02 DIAGNOSIS — M62838 Other muscle spasm: Secondary | ICD-10-CM | POA: Diagnosis not present

## 2018-05-02 DIAGNOSIS — I129 Hypertensive chronic kidney disease with stage 1 through stage 4 chronic kidney disease, or unspecified chronic kidney disease: Secondary | ICD-10-CM | POA: Diagnosis present

## 2018-05-02 DIAGNOSIS — E1122 Type 2 diabetes mellitus with diabetic chronic kidney disease: Secondary | ICD-10-CM | POA: Diagnosis present

## 2018-05-02 DIAGNOSIS — W010XXA Fall on same level from slipping, tripping and stumbling without subsequent striking against object, initial encounter: Secondary | ICD-10-CM | POA: Diagnosis present

## 2018-05-02 DIAGNOSIS — G92 Toxic encephalopathy: Secondary | ICD-10-CM | POA: Diagnosis not present

## 2018-05-02 DIAGNOSIS — K219 Gastro-esophageal reflux disease without esophagitis: Secondary | ICD-10-CM | POA: Diagnosis present

## 2018-05-02 DIAGNOSIS — G928 Other toxic encephalopathy: Secondary | ICD-10-CM

## 2018-05-02 DIAGNOSIS — E785 Hyperlipidemia, unspecified: Secondary | ICD-10-CM | POA: Diagnosis present

## 2018-05-02 DIAGNOSIS — B9620 Unspecified Escherichia coli [E. coli] as the cause of diseases classified elsewhere: Secondary | ICD-10-CM | POA: Diagnosis present

## 2018-05-02 DIAGNOSIS — N184 Chronic kidney disease, stage 4 (severe): Secondary | ICD-10-CM | POA: Diagnosis present

## 2018-05-02 DIAGNOSIS — K5900 Constipation, unspecified: Secondary | ICD-10-CM | POA: Diagnosis present

## 2018-05-02 DIAGNOSIS — D509 Iron deficiency anemia, unspecified: Secondary | ICD-10-CM | POA: Diagnosis present

## 2018-05-02 DIAGNOSIS — I10 Essential (primary) hypertension: Secondary | ICD-10-CM | POA: Diagnosis not present

## 2018-05-02 DIAGNOSIS — M5441 Lumbago with sciatica, right side: Secondary | ICD-10-CM | POA: Diagnosis present

## 2018-05-02 DIAGNOSIS — Z6833 Body mass index (BMI) 33.0-33.9, adult: Secondary | ICD-10-CM | POA: Diagnosis not present

## 2018-05-02 DIAGNOSIS — N179 Acute kidney failure, unspecified: Secondary | ICD-10-CM | POA: Diagnosis present

## 2018-05-02 DIAGNOSIS — Z23 Encounter for immunization: Secondary | ICD-10-CM | POA: Diagnosis present

## 2018-05-02 DIAGNOSIS — E876 Hypokalemia: Secondary | ICD-10-CM | POA: Diagnosis present

## 2018-05-02 DIAGNOSIS — E1165 Type 2 diabetes mellitus with hyperglycemia: Secondary | ICD-10-CM | POA: Diagnosis present

## 2018-05-02 DIAGNOSIS — E7800 Pure hypercholesterolemia, unspecified: Secondary | ICD-10-CM | POA: Diagnosis present

## 2018-05-02 DIAGNOSIS — N189 Chronic kidney disease, unspecified: Secondary | ICD-10-CM

## 2018-05-02 DIAGNOSIS — D696 Thrombocytopenia, unspecified: Secondary | ICD-10-CM | POA: Diagnosis present

## 2018-05-02 LAB — GLUCOSE, CAPILLARY
GLUCOSE-CAPILLARY: 171 mg/dL — AB (ref 65–99)
GLUCOSE-CAPILLARY: 203 mg/dL — AB (ref 65–99)
GLUCOSE-CAPILLARY: 223 mg/dL — AB (ref 65–99)
Glucose-Capillary: 168 mg/dL — ABNORMAL HIGH (ref 65–99)

## 2018-05-02 LAB — BASIC METABOLIC PANEL
ANION GAP: 15 (ref 5–15)
BUN: 95 mg/dL — ABNORMAL HIGH (ref 6–20)
CHLORIDE: 93 mmol/L — AB (ref 101–111)
CO2: 24 mmol/L (ref 22–32)
Calcium: 8.5 mg/dL — ABNORMAL LOW (ref 8.9–10.3)
Creatinine, Ser: 5.24 mg/dL — ABNORMAL HIGH (ref 0.44–1.00)
GFR calc Af Amer: 8 mL/min — ABNORMAL LOW (ref >60)
GFR, EST NON AFRICAN AMERICAN: 7 mL/min — AB (ref >60)
GLUCOSE: 158 mg/dL — AB (ref 65–99)
POTASSIUM: 4.3 mmol/L (ref 3.5–5.1)
Sodium: 132 mmol/L — ABNORMAL LOW (ref 135–145)

## 2018-05-02 LAB — CBC WITH DIFFERENTIAL/PLATELET
BASOS PCT: 0 %
Basophils Absolute: 0 10*3/uL (ref 0.0–0.1)
EOS PCT: 0 %
Eosinophils Absolute: 0 10*3/uL (ref 0.0–0.7)
HCT: 24.3 % — ABNORMAL LOW (ref 36.0–46.0)
HEMOGLOBIN: 8.2 g/dL — AB (ref 12.0–15.0)
Lymphocytes Relative: 3 %
Lymphs Abs: 0.6 10*3/uL — ABNORMAL LOW (ref 0.7–4.0)
MCH: 29.8 pg (ref 26.0–34.0)
MCHC: 33.7 g/dL (ref 30.0–36.0)
MCV: 88.4 fL (ref 78.0–100.0)
MONO ABS: 0.9 10*3/uL (ref 0.1–1.0)
Monocytes Relative: 5 %
NEUTROS PCT: 92 %
Neutro Abs: 17.4 10*3/uL — ABNORMAL HIGH (ref 1.7–7.7)
PLATELETS: 106 10*3/uL — AB (ref 150–400)
RBC: 2.75 MIL/uL — AB (ref 3.87–5.11)
RDW: 13.2 % (ref 11.5–15.5)
WBC: 18.9 10*3/uL — AB (ref 4.0–10.5)

## 2018-05-02 LAB — URINE CULTURE

## 2018-05-02 MED ORDER — POLYETHYLENE GLYCOL 3350 17 G PO PACK
17.0000 g | PACK | Freq: Two times a day (BID) | ORAL | Status: DC
  Administered 2018-05-02 – 2018-05-05 (×6): 17 g via ORAL
  Filled 2018-05-02 (×6): qty 1

## 2018-05-02 MED ORDER — SODIUM CHLORIDE 0.9 % IV SOLN
INTRAVENOUS | Status: DC
Start: 2018-05-02 — End: 2018-05-03
  Administered 2018-05-02: 18:00:00 via INTRAVENOUS

## 2018-05-02 MED ORDER — ALLOPURINOL 100 MG PO TABS
200.0000 mg | ORAL_TABLET | Freq: Every day | ORAL | Status: DC
  Administered 2018-05-03 – 2018-05-05 (×3): 200 mg via ORAL
  Filled 2018-05-02 (×3): qty 2

## 2018-05-02 MED ORDER — BISACODYL 10 MG RE SUPP: 10.0000 mg | Freq: Every day | RECTAL | Status: DC | PRN

## 2018-05-02 MED ORDER — OXYCODONE-ACETAMINOPHEN 5-325 MG PO TABS
1.0000 | ORAL_TABLET | Freq: Two times a day (BID) | ORAL | Status: DC | PRN
  Administered 2018-05-04: 1 via ORAL
  Filled 2018-05-02 (×2): qty 1

## 2018-05-02 MED ORDER — SENNA 8.6 MG PO TABS
1.0000 | ORAL_TABLET | Freq: Every day | ORAL | Status: DC
  Administered 2018-05-02 – 2018-05-05 (×4): 8.6 mg via ORAL
  Filled 2018-05-02 (×4): qty 1

## 2018-05-02 MED ORDER — MORPHINE SULFATE (PF) 4 MG/ML IV SOLN: 2.0000 mg | Freq: Two times a day (BID) | INTRAVENOUS | Status: DC | PRN

## 2018-05-02 NOTE — Progress Notes (Signed)
CSW following for discharge plan. Patient has auth and bed available at [facility] when medically stable. Per MD, not ready for discharge today.  CSW to follow.  Laveda Abbe, [place] Clinical Social Worker [phone]

## 2018-05-02 NOTE — Consult Note (Signed)
Renal Service Consult Note [facility]  Erika Fry 05/02/2018 Sol Blazing Requesting Physician:  Dr Algis Liming  Reason for Consult:   HPI: The patient is a 72 y.o. year-old with history of hypertension migraines anemia fe def high cholesterol gout gerd and ckd stage IV presented on 04/29/18 with fall and R hip pain. Also recent PPM placement in April 2019.  Came to ED where CT pelvis was negative. Pt admitted for acute LBP with sciatica. Since admission urine cx grew [facility] and pt has gotten 3 days of IV rocephin for UTI.  BP's were normal then dropped into 90's so bp meds were reduced.  Creat was 3.8 on admissoin, rose to 5.4 and today is down to 5.24.  IVF"s were started yesterday.  norvasc and hydralazine were both dc'd on 5/30 due to low bp's, also volataren gel dc'd on 5/31. Lasix ordered once but didn't get.   Patient feels bad with R sided lower back pain.  No appetite no nausea or vomiting no sob or cough or cp.  No abd pain.  Has known CKD f/b Dr Florene Glen at [facility], they have not discussed dialysis in their visits.      home meds:  - norvasc 10/ lasix 80 bid/ hydralazine 50 tid  - statin/ zantac/ mvi/ synthroid/ prn's / ecasa 81/ allopurinol 300  - vitamins+ supplements   ROS  denies CP  no joint pain   no HA  no blurry vision  no rash  no diarrhea  no nausea/ vomiting    Past Medical History  Past Medical History:  Diagnosis Date  . Arthritis    "knees, thumbs" (03/25/2018)  . CKD (chronic kidney disease), stage IV ([place])   . Diet-controlled diabetes mellitus ([place])   . GERD (gastroesophageal reflux disease)   . Gout    "on daily RX" (03/25/2018)  . Heart murmur   . High cholesterol   . Hypertension   . Hypothyroidism   . Iron deficiency anemia    "had to get an iron infusion"  . Migraine    "used to have them growing up" (03/25/2018)  . Presence of permanent cardiac pacemaker 03/25/2018   Past Surgical History  Past Surgical History:   Procedure Laterality Date  . APPENDECTOMY    . INSERT / REPLACE / REMOVE PACEMAKER  03/25/2018  . KNEE ARTHROSCOPY Bilateral   . PACEMAKER IMPLANT N/A 03/25/2018   Procedure: PACEMAKER IMPLANT;  Surgeon: Evans Lance, MD;  Location: [place] [facility];  Service: Cardiovascular;  Laterality: N/A;  . PACEMAKER PLACEMENT Left 03/2018  . RIGHT HEART CATH N/A 03/20/2018   Procedure: RIGHT HEART CATH;  Surgeon: Nigel Mormon, MD;  Location: [place] [facility];  Service: Cardiovascular;  Laterality: N/A;  . TEE WITHOUT CARDIOVERSION N/A 03/20/2018   Procedure: TRANSESOPHAGEAL ECHOCARDIOGRAM (TEE);  Surgeon: Nigel Mormon, MD;  Location: [facility] ENDOSCOPY;  Service: Cardiovascular;  Laterality: N/A;  . TONSILLECTOMY     Family History  Family History  Problem Relation Age of Onset  . Hypertension Mother   . Diabetes Mellitus II Father   . Heart disease Father   . Gastric cancer Brother   . Diabetes Mellitus II Brother    Social History  reports that she has never smoked. She has never used smokeless tobacco. She reports that she drank alcohol. She reports that she does not use drugs. Allergies  Allergies  Allergen Reactions  . Codeine Other (See Comments)    Increases Pain and  couldn't sleep   Home medications Prior to Admission medications   Medication Sig Start Date End Date Taking? Authorizing Provider  allopurinol (ZYLOPRIM) 300 MG tablet Take 300 mg by mouth daily.    Yes [provider]  amLODipine (NORVASC) 10 MG tablet Take 10 mg by mouth daily.   Yes [provider]  aspirin EC 81 MG tablet Take 81 mg by mouth daily.   Yes [provider]  BIOTIN FORTE PO Take 1 tablet by mouth daily.   Yes [provider]  cetirizine (ZYRTEC) 10 MG tablet Take 10 mg by mouth at bedtime.    Yes [provider]  Cyanocobalamin (VITAMIN B-12 PO) Take by mouth daily.   Yes [provider]  fluticasone (FLONASE) 50 MCG/ACT nasal spray  Place 2 sprays into both nostrils daily as needed for allergies or rhinitis.   Yes [provider]  folic acid (FOLVITE) 1 MG tablet Take 1 mg by mouth daily.   Yes [provider]  furosemide (LASIX) 80 MG tablet Take 80 mg by mouth 2 (two) times daily.    Yes [provider]  hydrALAZINE (APRESOLINE) 50 MG tablet Take 50 mg by mouth 3 (three) times daily.   Yes [provider]  levothyroxine (SYNTHROID, LEVOTHROID) 75 MCG tablet Take 75 mcg by mouth daily before breakfast.   Yes [provider]  Multiple Vitamin (MULTI-VITAMIN PO) Take 1 tablet by mouth daily.   Yes [provider]  ranitidine (ZANTAC) 150 MG tablet Take 150 mg by mouth daily.    Yes [provider]  simvastatin (ZOCOR) 40 MG tablet Take 40 mg by mouth daily.    Yes [provider]   Liver Function Tests No results for input(s): AST, ALT, ALKPHOS, BILITOT, PROT, ALBUMIN in the last 168 hours. No results for input(s): LIPASE, AMYLASE in the last 168 hours. CBC Recent Labs  Lab 04/29/18 1622  05/01/18 0759 05/01/18 2130 05/02/18 0531  WBC 13.0*   < > 25.8* 18.0* 18.9*  NEUTROABS 11.6*  --   --   --  17.4*  HGB 6.7*   < > 7.5* 8.3* 8.2*  HCT 20.0*   < > 22.5* 24.5* 24.3*  MCV 89.7   < > 87.9 87.2 88.4  PLT 122*   < > 93* 98* 106*   < > = values in this interval not displayed.   Basic Metabolic Panel Recent Labs  Lab 04/29/18 1622 04/30/18 0455 05/01/18 0759 05/01/18 2130 05/02/18 0531  NA 134* 135 132* 134* 132*  K 3.2* 3.1* 4.2 4.0 4.3  CL 98* 98* 93* 94* 93*  CO2 _0 GLUCOSE 151* 157* 181* 147* 158*  BUN 69* 73* 88* 94* 95*  CREATININE 3.89* 4.35* 5.40* 5.32* 5.24*  CALCIUM 8.8* 9.0 8.4* 8.5* 8.5*   Iron/TIBC/Ferritin/ %Sat    Component Value Date/Time   IRON 16 (L) 04/29/2018 1942   TIBC 235 (L) 04/29/2018 1942   FERRITIN 204 04/29/2018 1942   IRONPCTSAT 7 (L) 04/29/2018 1942    Vitals:   05/01/18 2006 05/01/18  2036 05/02/18 0732 05/02/18 1247  BP: (!) 162/81 (!) 154/83 133/71 137/70  Pulse: 98 (!) 126 93 (!) 101  Resp:  _1 Temp: 98.9 F (37.2 C) 99.8 F (37.7 C)  (!) 97.5 F (36.4 C)  TempSrc: Oral Oral  Oral  SpO2: (!) 88% 98% 98% 93%  Weight:      Height:  Exam Gen obese elderly wf groggy and has mild jerking of UE's off and on No rash, cyanosis or gangrene Sclera anicteric, throat clear  No jvd or bruits Chest clear bilat to bases RRR no MRG Abd soft ntnd no mass or ascites +bs GU defer MS no joint effusions or deformity Ext trace- 1+ bilat LE edema, no wounds or ulcers Neuro is groggy with +asterixis which is mild, O x3 moves all ext    inpatient meds:  - allopurinol/ ecasa/ folvite/ sq hep/ hydral 50 tid (dc'd yest)/ insulin ssi/ t4/ claritin/ medrol dosepak/ mvi/ kcl/ statin/ prn robaxin/ morphine iv/ percocet po prn  - voltaren gel (dc'd yest)/ pepcid po/ iv rocephin 1 gm qd x 3    date   Creat  Egfr/ notes  nov 2018  3.05  15 Mar 2018  3.8- 4.1 10- 11  Apr 29, 2018  3.89  Apr 30, 2018  4.35  May 01, 2018  5.40  jun 1 , 2019  5.24  7    ua 5/29 > clear 100 prot many bact/ 20-50 wbc/ 0-5 rbc  echo 03/20/18 > hyperdynamic LV with severe basal septal hypertrophy and LVOT   obstruction.  No valvular disease  renal US from feb 2018 > 12- 13 cm kidneys w/ no hydro  net I/O since admit is negative 700 cc (1.0 L in and 1.7 L out)  bp's - have been up and down , several low BP's in the 90's since admission  urine cx = + Ecoli     Impression: 1 acute renal failure on ckd IV - mild asterixis prob more from narcotic excess than uremia. Creat bump/ aki agree due to hypotension and improving now that bp's meds stopped and getting ivf's.  Agree w/ plan, no indication for dialysis yet. Baseline creat 3.5- 4.0 2 fall / R back pain - getting steroids now for this 3 ecoli UTI - on rocephin iv 4 hypertension - bp meds on hold (norvasc/ hydral) 5 hyperlipidemia 6 sp  ppm - in April 7 anemia - prob related ckd , hb around 8   Plan - cont ivf's and cont to hold bp meds, decrease narcotic pain meds if possible (have d/w pt)  Kelly Splinter MD [place] pager [phone]   05/02/2018, 3:24 PM

## 2018-05-02 NOTE — Progress Notes (Signed)
Pt not able to tolerate sitting up or raising head enough to swallow PO meds without concern for aspiration. Notified Blount, NP. Given instructions to hold meds for the night. Offered IV pain medication to pt and she declined. Will continue to monitor.

## 2018-05-02 NOTE — Progress Notes (Signed)
PROGRESS NOTE   Erika Fry  WVP:[id number]    DOB: 1946-11-29    DOA: 04/29/2018  PCP: [name], MD   I have briefly reviewed patients previous medical records in [facility].  Brief Narrative:  72 year old female with PMH of HTN, HLD, diet-controlled DM, GERD, hypothyroid, gout, stage IV chronic kidney disease, iron deficiency anemia, second-degree AV block, recent pacemaker placement 03/25/2018 (Dr. Crissie Sickles, Dr. Adrian Prows, Cardiology) slipped out of her recliner at home, sustained fall and laid on the floor for about 3 hours and presented to ED with acute right lower back pain.  CT and pelvis without acute findings.  Admitted for acute right low back pain with sciatica. Ongoing back pain without much relief. Unable to do MRI due to pacer dependent per d/w Cardiology.  Steroid Dosepak initiated.  Nephrology consulted for acute on chronic kidney disease.   Assessment & Plan:   Principal Problem:   Fall Active Problems:   CKD (chronic kidney disease), stage IV (H[facility])   Diet-controlled diabetes mellitus ([facility])   GERD (gastroesophageal reflux disease)   Gout   High cholesterol   Hypothyroidism   Iron deficiency anemia   Essential hypertension   Back pain   Leukocytosis   Hypokalemia   UTI (urinary tract infection)   Acute right low back pain with sciatica: Sustained after mechanical fall from recliner at home.  CT lumbar spine and pelvis without acute findings.  Possibly from bone or muscle bruising/spasm.  No neurological deficits on exam.  Despite treating supportively with heat pack, pain management with opioids, muscle relaxants, lidocaine, had ongoing significant pain without relief.  Unable to perform MRI due to recent permanent pacemaker (discussed with her cardiologist) or CT myelogram due to renal insufficiency.  Started Medrol Dosepak.   I discussed with neurosurgeon on-call on 5/31.  I reviewed the case with him and he was able to see patients admission  CT images.  He recommended continue current management including steroids, rest, PT, attempt to improve renal functions and if does not get better then may need to consider CT myelogram next week with minimal contrast.  As per nursing, patient did not start Medrol Dosepak last night because she was somnolent and felt unsafe to take by mouth.  Did start Dosepak this morning.  Monitor for response.  Due to worsening renal insufficiency and somnolence, reduced opioids.  Hopefully pain will improve with steroids and may be able to minimize even further and discontinue.  Suspected E. coli acute cystitis: Completed 3 days of IV ceftriaxone-discontinued.  Normocytic anemia: Could be from anemia of renal insufficiency, chronic disease versus iron deficiency.  Iron 16, ferritin 204.  Folate 69 and B12 >7500.  Hemoglobin has dropped from 8.2-7.5 in the absence of overt bleeding.  Hemoglobin stable in the low 8 g range.  Thrombocytopenia, mild: Unclear etiology.  Platelet count seems to have improved yesterday but again has dropped from 135-93.  DC heparin DVT prophylaxis, start SCDs and follow CBC in a.m.  If continues to drop, may need further evaluation.  Platelets up from 98-1 06, continue to follow daily CBCs.  Hypokalemia: Replaced..  Magnesium normal.  Acute on stage 4 chronic kidney disease: Follows with Dr. Florene Glen, Nephrology.  Presented with creatinine of 3.8 which increased to 4.3 and then to 5.4.  Could be related to poor oral intake or hemodynamic/soft blood pressures.  Not on ACEI/ARB or NSAIDs.  Discontinued antihypertensives and Lasix.  Brief and gentle IV fluids, creatinine slightly better  but still significantly elevated at 5.32.  Nephrology consulted and discussed with Dr. Melvia Heaps mother agrees with IV fluids and holding antihypertensives.  Diet controlled DM 2: A1c 6.3.  Monitor CBGs closely and SSI added.  Monitor closely while steroid dose pack initiated.  Essential hypertension: Due to  soft blood pressures, antihypertensives including amlodipine and hydralazine were held.  Lasix also on hold.  Hypotension resolved.  PRN hydralazine for now.  Hyperlipidemia: Continue statins.  Hypothyroid: Continue Synthroid.  Clinically euthyroid.  GERD: PPI.  Status post recent pacemaker placement.  Leukocytosis: Unclear etiology.Progressively increasing 13>24>25.8. UTI now treated. No diarrhea or other symptoms suggestive of infection were reported. No likely to worsen with steroids.  Leukocytosis has improved from 84.6-96.2, neutrophilic.  Morphology unremarkable.  Acute respiratory failure with hypoxia: Likely related to sedation from medications, minimized.  Lungs clinically clear.  Incentive spirometry.  Oxygen supplements as needed.  Will get chest x-ray for completion.  Acute toxic metabolic encephalopathy: Her somnolence is likely multifactorial related to opioid pain medications and worsening renal functions.  Treat as above and monitor closely.   DVT prophylaxis: Subcutaneous heparin - DC'ed. SCD's Code Status: Full Family Communication: I discussed in detail with patient's next daughter-in-law, updated care and answered questions. Disposition: Not medically stable for DC.  Due to complexity of her care including unrelenting low back pain requiring high level decision making, treatment, worsening renal insufficiency requiring nephrology consultation, meets criteria for inpatient admission, changed.   Consultants:  Nephrology  Procedures:  None  Antimicrobials:  Completed 3 days of IV ceftriaxone   Subjective: Overnight events noted.  Discussed with RN at bedside, did not receive Medrol Dosepak due to mental status changes/less responsive and concern for aspiration.  Ongoing severe low back pain.  No dyspnea, cough or chest pain reported.  ROS: As above.  Objective:  Vitals:   05/01/18 2006 05/01/18 2036 05/02/18 0732 05/02/18 1247  BP: (!) 162/81 (!) 154/83  133/71 137/70  Pulse: 98 (!) 126 93 (!) 101  Resp:  18 16 16   Temp: 98.9 F (37.2 C) 99.8 F (37.7 C)  (!) 97.5 F (36.4 C)  TempSrc: Oral Oral  Oral  SpO2: (!) 88% 98% 98% 93%  Weight:      Height:        Examination:  General exam: Pleasant elderly female, moderately built and obese, lying uncomfortably supine in bed.  Continues to be in pain. Respiratory system: Clear to auscultation. Respiratory effort normal.  Stable Cardiovascular system: S1 & S2 heard, RRR. No JVD, murmurs, rubs, gallops or clicks. No pedal edema.  Telemetry personally reviewed: V paced rhythm. Gastrointestinal system: Abdomen is nondistended, soft and nontender. No organomegaly or masses felt. Normal bowel sounds heard.  Stable Central nervous system: Slightly drowsy but easily arousable and oriented. No focal neurological deficits. Extremities: Symmetric 5 x 5 power in upper extremities. Seems to have normal power in lower extremity but limited assessment due to pain.  At least 4 x 5 power in both lower extremities.  Stable without change Skin: No rashes, lesions or ulcers Psychiatry: Judgement and insight appear normal. Mood & affect appropriate. Musculoskeletal: Mild right lower lumbar paraspinal muscle tenderness/spasm without deformities.  I examined in detail along with patient's RN, turn patient over in bed, no external skin abnormalities noted.  Minimal sacral edema.  Patient had soiled bed with urine.    Data Reviewed: I have personally reviewed following labs and imaging studies  CBC: Recent Labs  Lab 04/29/18 1622 04/29/18 1704  04/30/18 0455 05/01/18 0759 05/01/18 2130 05/02/18 0531  WBC 13.0*  --  24.3* 25.8* 18.0* 18.9*  NEUTROABS 11.6*  --   --   --   --  17.4*  HGB 6.7* 8.5* 8.2* 7.5* 8.3* 8.2*  HCT 20.0* 25.2* 24.5* 22.5* 24.5* 24.3*  MCV 89.7  --  88.4 87.9 87.2 88.4  PLT 122*  --  135* 93* 98* 161*   Basic Metabolic Panel: Recent Labs  Lab 04/29/18 1622 04/29/18 1942  04/30/18 0455 05/01/18 0759 05/01/18 2130 05/02/18 0531  NA 134*  --  135 132* 134* 132*  K 3.2*  --  3.1* 4.2 4.0 4.3  CL 98*  --  98* 93* 94* 93*  CO2 23  --  25 24 22 24   GLUCOSE 151*  --  157* 181* 147* 158*  BUN 69*  --  73* 88* 94* 95*  CREATININE 3.89*  --  4.35* 5.40* 5.32* 5.24*  CALCIUM 8.8*  --  9.0 8.4* 8.5* 8.5*  MG  --  2.0  --   --   --   --    Cardiac Enzymes: Recent Labs  Lab 04/29/18 1622  CKTOTAL 81   HbA1C: Recent Labs    04/30/18 0455  HGBA1C 6.3*   CBG: Recent Labs  Lab 05/01/18 0820 05/01/18 1910 05/01/18 2251 05/02/18 0801 05/02/18 1250  GLUCAP 161* 140* 191* 168* 171*    Recent Results (from the past 240 hour(s))  Urine Culture     Status: Abnormal   Collection Time: 04/30/18  3:17 AM  Result Value Ref Range Status   Specimen Description URINE, RANDOM  Final   Special Requests   Final    NONE Performed at [facility], [place] [address], [place], [place] [address]    Culture >=100,000 COLONIES/mL ESCHERICHIA COLI (A)  Final   Report Status 05/02/2018 FINAL  Final   Organism ID, Bacteria ESCHERICHIA COLI (A)  Final      Susceptibility   Escherichia coli - MIC*    AMPICILLIN >=32 RESISTANT Resistant     CEFAZOLIN <=4 SENSITIVE Sensitive     CEFTRIAXONE <=1 SENSITIVE Sensitive     CIPROFLOXACIN <=0.25 SENSITIVE Sensitive     GENTAMICIN <=1 SENSITIVE Sensitive     IMIPENEM <=0.25 SENSITIVE Sensitive     NITROFURANTOIN <=16 SENSITIVE Sensitive     TRIMETH/SULFA >=320 RESISTANT Resistant     AMPICILLIN/SULBACTAM >=32 RESISTANT Resistant     PIP/TAZO <=4 SENSITIVE Sensitive     Extended ESBL NEGATIVE Sensitive     * >=100,000 COLONIES/mL ESCHERICHIA COLI  MRSA PCR Screening     Status: None   Collection Time: 04/30/18  3:35 AM  Result Value Ref Range Status   MRSA by PCR NEGATIVE NEGATIVE Final    Comment:        The GeneXpert MRSA Assay (FDA approved for NASAL specimens only), is one component of a comprehensive MRSA  colonization surveillance program. It is not intended to diagnose MRSA infection nor to guide or monitor treatment for MRSA infections. Performed at [facility], [place] [address], [place], [place] [address]   Culture, blood (Routine X 2) w Reflex to ID Panel     Status: None (Preliminary result)   Collection Time: 04/30/18  4:55 AM  Result Value Ref Range Status   Specimen Description BLOOD RIGHT ANTECUBITAL  Final   Special Requests   Final    BOTTLES DRAWN AEROBIC AND ANAEROBIC Blood Culture adequate volume  Culture   Final    NO GROWTH 2 DAYS Performed at [facility], [place] [address], [place], [place] [address]    Report Status PENDING  Incomplete  Culture, blood (Routine X 2) w Reflex to ID Panel     Status: None (Preliminary result)   Collection Time: 04/30/18  6:33 AM  Result Value Ref Range Status   Specimen Description BLOOD RIGHT HAND  Final   Special Requests   Final    BOTTLES DRAWN AEROBIC ONLY Blood Culture adequate volume   Culture   Final    NO GROWTH 2 DAYS Performed at [facility], [place] [address], [place],  [address]    Report Status PENDING  Incomplete         Radiology Studies: No results found.      Scheduled Meds: . acetaminophen  500 mg Oral TID  . allopurinol  300 mg Oral Daily  . aspirin EC  81 mg Oral Daily  . famotidine  20 mg Oral QHS  . folic acid  1 mg Oral Daily  . insulin aspart  0-9 Units Subcutaneous TID WC  . levothyroxine  75 mcg Oral QAC breakfast  . lidocaine  1 patch Transdermal Q24H  . loratadine  10 mg Oral Daily  . methylPREDNISolone  4 mg Oral PC supper  . methylPREDNISolone  4 mg Oral 3 x daily with food  . [START ON 05/03/2018] methylPREDNISolone  4 mg Oral 4X daily taper  . methylPREDNISolone  8 mg Oral Nightly  . methylPREDNISolone  8 mg Oral Nightly  . multivitamin with minerals  1 tablet Oral Daily  . polyethylene glycol  17 g Oral BID  . senna  1 tablet Oral Daily  . simvastatin  40  mg Oral Daily   Continuous Infusions: . sodium chloride       LOS: 0 days     Vernell Leep, MD, FACP, [facility] Pager [phone]  If 7PM-7AM, please contact night-coverage [email or web address] Password [email or web address] 05/02/2018, 4:31 PM

## 2018-05-03 LAB — BASIC METABOLIC PANEL
Anion gap: 16 — ABNORMAL HIGH (ref 5–15)
BUN: 96 mg/dL — ABNORMAL HIGH (ref 6–20)
CHLORIDE: 95 mmol/L — AB (ref 101–111)
CO2: 21 mmol/L — AB (ref 22–32)
Calcium: 8.9 mg/dL (ref 8.9–10.3)
Creatinine, Ser: 4.26 mg/dL — ABNORMAL HIGH (ref 0.44–1.00)
GFR calc non Af Amer: 9 mL/min — ABNORMAL LOW (ref >60)
GFR, EST AFRICAN AMERICAN: 11 mL/min — AB (ref >60)
Glucose, Bld: 208 mg/dL — ABNORMAL HIGH (ref 65–99)
Potassium: 5 mmol/L (ref 3.5–5.1)
Sodium: 132 mmol/L — ABNORMAL LOW (ref 135–145)

## 2018-05-03 LAB — GLUCOSE, CAPILLARY
GLUCOSE-CAPILLARY: 298 mg/dL — AB (ref 65–99)
Glucose-Capillary: 210 mg/dL — ABNORMAL HIGH (ref 65–99)
Glucose-Capillary: 210 mg/dL — ABNORMAL HIGH (ref 65–99)
Glucose-Capillary: 253 mg/dL — ABNORMAL HIGH (ref 65–99)

## 2018-05-03 MED ORDER — INSULIN ASPART 100 UNIT/ML ~~LOC~~ SOLN
0.0000 [IU] | Freq: Every day | SUBCUTANEOUS | Status: DC
  Administered 2018-05-03 – 2018-05-04 (×2): 3 [IU] via SUBCUTANEOUS

## 2018-05-03 MED ORDER — INSULIN ASPART 100 UNIT/ML ~~LOC~~ SOLN
0.0000 [IU] | Freq: Three times a day (TID) | SUBCUTANEOUS | Status: DC
  Administered 2018-05-03: 5 [IU] via SUBCUTANEOUS
  Administered 2018-05-03: 8 [IU] via SUBCUTANEOUS
  Administered 2018-05-04 (×2): 11 [IU] via SUBCUTANEOUS
  Administered 2018-05-04: 8 [IU] via SUBCUTANEOUS
  Administered 2018-05-05 (×2): 5 [IU] via SUBCUTANEOUS

## 2018-05-03 MED ORDER — INSULIN GLARGINE 100 UNIT/ML ~~LOC~~ SOLN
5.0000 [IU] | Freq: Every day | SUBCUTANEOUS | Status: DC
  Administered 2018-05-03 – 2018-05-04 (×2): 5 [IU] via SUBCUTANEOUS
  Filled 2018-05-03 (×3): qty 0.05

## 2018-05-03 NOTE — Progress Notes (Signed)
PROGRESS NOTE   Erika Fry  YTK:[id number]    DOB: 10/16/1946    DOA: 04/29/2018  PCP: [name], MD   I have briefly reviewed patients previous medical records in [name].  Brief Narrative:  72 year old female with PMH of HTN, HLD, diet-controlled DM, GERD, hypothyroid, gout, stage IV chronic kidney disease, iron deficiency anemia, second-degree AV block, recent pacemaker placement 03/25/2018 (Dr. Crissie Sickles, Dr. Adrian Prows, Cardiology) slipped out of her recliner at home, sustained fall and laid on the floor for about 3 hours and presented to ED with acute right lower back pain.  CT and pelvis without acute findings.  Admitted for acute right low back pain with sciatica. Ongoing back pain without much relief. Unable to do MRI due to pacer dependent per d/w Cardiology.  Steroid Dosepak initiated.  Nephrology consulted for acute on chronic kidney disease.   Assessment & Plan:   Principal Problem:   Fall Active Problems:   CKD (chronic kidney disease), stage IV (HCC)   Diet-controlled diabetes mellitus (HCC)   GERD (gastroesophageal reflux disease)   Gout   High cholesterol   Hypothyroidism   Iron deficiency anemia   Essential hypertension   Back pain   Leukocytosis   Hypokalemia   UTI (urinary tract infection)   Acute low back pain with sciatica   Acute kidney injury superimposed on chronic kidney disease (HCC)   Toxic metabolic encephalopathy   Acute right low back pain with sciatica: Sustained after mechanical fall from recliner at home.  CT lumbar spine and pelvis without acute findings.  Possibly from bone or muscle bruising/spasm.  No neurological deficits on exam.  Despite treating supportively with heat pack, pain management with opioids, muscle relaxants, lidocaine, had ongoing significant pain without relief.  Unable to perform MRI due to recent permanent pacemaker (discussed with her cardiologist) or CT myelogram due to renal insufficiency.  Started  Medrol Dosepak.   I discussed with neurosurgeon on-call on 5/31.  I reviewed the case with him and he was able to see patients admission CT images.  He recommended continue current management including steroids, rest, PT, attempt to improve renal functions and if does not get better then may need to consider CT myelogram next week with minimal contrast.  Due to worsening renal insufficiency and somnolence, reduced opioids on 6/1.  Hopefully pain will improve with steroids and may be able to minimize even further and discontinue.  Back pain seems much better compared to yesterday.  Continue Medrol Dosepak, mobilize.  Suspected E. coli acute cystitis: Completed 3 days of IV ceftriaxone-discontinued.  Normocytic anemia: Could be from anemia of renal insufficiency, chronic disease versus iron deficiency.  Iron 16, ferritin 204.  Folate 69 and B12 >7500.  Hemoglobin has dropped from 8.2-7.5 in the absence of overt bleeding.  Hemoglobin stable in the low 8 g range.  CBC in process.  Thrombocytopenia, mild: Unclear etiology.  Platelet count seems to have improved yesterday but again has dropped from 135-93.  DC heparin DVT prophylaxis, start SCDs and follow CBC in a.m.  If continues to drop, may need further evaluation.  Platelets up from 98-1 06, continue to follow daily CBCs.  CBC in process.  Hypokalemia: Replaced..  Magnesium normal.  Acute on stage 4 chronic kidney disease: Follows with Dr. Florene Glen, Nephrology.  Presented with creatinine of 3.8 which increased to 4.3 and then to 5.4.  Could be related to poor oral intake or hemodynamic/soft blood pressures.  Not on ACEI/ARB or  NSAIDs.  Discontinued antihypertensives and Lasix.  Brief and gentle IV fluids, creatinine slightly better but still significantly elevated at 5.32.  Nephrology consultation appreciated.  Creatinine is down from 5.24-4.26.  Discontinued IV fluids.  Encourage oral intake.  Continue to trend daily BMP.  Diet controlled DM 2: A1c  6.3.  CBGs now uncontrolled as below due to steroid Dosepak.  Changed SSI to moderate.  Started Lantus 5 units daily.  Adjust insulins as needed.   Essential hypertension: Due to soft blood pressures, antihypertensives including amlodipine and hydralazine were held.  Lasix also on hold.  Hypotension resolved.  PRN hydralazine for now.  Continue to hold medications.  Blood pressures controlled without them.  Hyperlipidemia: Continue statins.  Hypothyroid: Continue Synthroid.  Clinically euthyroid.  GERD: PPI.  Status post recent pacemaker placement.  Leukocytosis: Unclear etiology.Progressively increasing 13>24>25.8. UTI now treated. No diarrhea or other symptoms suggestive of infection were reported. No likely to worsen with steroids.  Leukocytosis has improved from 33.2-95.1, neutrophilic.  Morphology unremarkable.  CBCs today in process.  Acute respiratory failure with hypoxia: Likely related to sedation from medications, minimized.  Lungs clinically clear.  Incentive spirometry.  Oxygen supplements as needed.  Wean off of oxygen as tolerated.  Acute toxic metabolic encephalopathy: Her somnolence is likely multifactorial related to opioid pain medications and worsening renal functions.  Treat as above and monitor closely.  Much improved.  Alert and coherent this morning.   DVT prophylaxis: Subcutaneous heparin - DC'ed. SCD's Code Status: Full Family Communication: I discussed in detail with patient's son at bedside.  Updated care and answered questions. Disposition: DC likely to SNF pending clinical improvement, possibly in the next 2 to 3 days.   Consultants:  Nephrology  Procedures:  None  Antimicrobials:  Completed 3 days of IV ceftriaxone   Subjective: Patient interviewed and examined this morning in the presence of her son and RN at bedside.  Indicates that her low back pain is better.  No BM since admission.  Passing flatus.  Willing to try to get out of bed and mobilize.   Both patient and son indicate that if she does not get better, they would rather wait 2 more weeks to get MRI rather than getting contrasted CT due to concern for worsening renal functions.  ROS: As above.  Objective:  Vitals:   05/02/18 1247 05/02/18 2055 05/02/18 2055 05/03/18 0509  BP: 137/70 124/68 124/68 117/70  Pulse: (!) 101 88 85 77  Resp: 16 18 18    Temp: (!) 97.5 F (36.4 C) (!) 97.5 F (36.4 C) (!) 97.5 F (36.4 C) 97.7 F (36.5 C)  TempSrc: Oral Oral Oral Oral  SpO2: 93% 96% 97% 97%  Weight:      Height:        Examination:  General exam: Pleasant elderly female, moderately built and obese, lying comfortably supine in bed.  Looks much improved compared to 6/1. Respiratory system: Clear to auscultation. Respiratory effort normal.  Stable without change. Cardiovascular system: S1 & S2 heard, RRR. No JVD, murmurs, rubs, gallops or clicks. No pedal edema.  Telemetry personally reviewed: V paced rhythm.  Stable. Gastrointestinal system: Abdomen is nondistended, soft and nontender. No organomegaly or masses felt. Normal bowel sounds heard.  Stable. Central nervous system: Alert and oriented x3. No focal neurological deficits. Extremities: Symmetric 5 x 5 power in upper extremities. Seems to have normal power in lower extremity but limited assessment due to pain.  At least 4 x 5 power in both  lower extremities.  Able to move her lower extremities better. Skin: No rashes, lesions or ulcers Psychiatry: Judgement and insight appear normal. Mood & affect appropriate. Musculoskeletal: Mild right lower lumbar paraspinal muscle tenderness/spasm without deformities.  I examined in detail along with patient's RN, turn patient over in bed, no external skin abnormalities noted.  Minimal sacral edema.  Not examined today.    Data Reviewed: I have personally reviewed following labs and imaging studies  CBC: Recent Labs  Lab 04/29/18 1622 04/29/18 1704 04/30/18 0455 05/01/18 0759  05/01/18 2130 05/02/18 0531  WBC 13.0*  --  24.3* 25.8* 18.0* 18.9*  NEUTROABS 11.6*  --   --   --   --  17.4*  HGB 6.7* 8.5* 8.2* 7.5* 8.3* 8.2*  HCT 20.0* 25.2* 24.5* 22.5* 24.5* 24.3*  MCV 89.7  --  88.4 87.9 87.2 88.4  PLT 122*  --  135* 93* 98* 096*   Basic Metabolic Panel: Recent Labs  Lab 04/29/18 1942 04/30/18 0455 05/01/18 0759 05/01/18 2130 05/02/18 0531 05/03/18 0649  NA  --  135 132* 134* 132* 132*  K  --  3.1* 4.2 4.0 4.3 5.0  CL  --  98* 93* 94* 93* 95*  CO2  --  25 24 22 24  21*  GLUCOSE  --  157* 181* 147* 158* 208*  BUN  --  73* 88* 94* 95* 96*  CREATININE  --  4.35* 5.40* 5.32* 5.24* 4.26*  CALCIUM  --  9.0 8.4* 8.5* 8.5* 8.9  MG 2.0  --   --   --   --   --    Cardiac Enzymes: Recent Labs  Lab 04/29/18 1622  CKTOTAL 81   HbA1C: No results for input(s): HGBA1C in the last 72 hours. CBG: Recent Labs  Lab 05/02/18 0801 05/02/18 1250 05/02/18 1652 05/02/18 2206 05/03/18 0758  GLUCAP 168* 171* 203* 223* 210*    Recent Results (from the past 240 hour(s))  Urine Culture     Status: Abnormal   Collection Time: 04/30/18  3:17 AM  Result Value Ref Range Status   Specimen Description URINE, RANDOM  Final   Special Requests   Final    NONE Performed at [facility], [place] [address] [place], [place] [address]    Culture >=100,000 COLONIES/mL ESCHERICHIA COLI (A)  Final   Report Status 05/02/2018 FINAL  Final   Organism ID, Bacteria ESCHERICHIA COLI (A)  Final      Susceptibility   Escherichia coli - MIC*    AMPICILLIN >=32 RESISTANT Resistant     CEFAZOLIN <=4 SENSITIVE Sensitive     CEFTRIAXONE <=1 SENSITIVE Sensitive     CIPROFLOXACIN <=0.25 SENSITIVE Sensitive     GENTAMICIN <=1 SENSITIVE Sensitive     IMIPENEM <=0.25 SENSITIVE Sensitive     NITROFURANTOIN <=16 SENSITIVE Sensitive     TRIMETH/SULFA >=320 RESISTANT Resistant     AMPICILLIN/SULBACTAM >=32 RESISTANT Resistant     PIP/TAZO <=4 SENSITIVE Sensitive     Extended ESBL  NEGATIVE Sensitive     * >=100,000 COLONIES/mL ESCHERICHIA COLI  MRSA PCR Screening     Status: None   Collection Time: 04/30/18  3:35 AM  Result Value Ref Range Status   MRSA by PCR NEGATIVE NEGATIVE Final    Comment:        The GeneXpert MRSA Assay (FDA approved for NASAL specimens only), is one component of a comprehensive MRSA colonization surveillance program. It is not intended to diagnose MRSA infection nor  to guide or monitor treatment for MRSA infections. Performed at [facility], [place] [address], [place], [place] [address]   Culture, blood (Routine X 2) w Reflex to ID Panel     Status: None (Preliminary result)   Collection Time: 04/30/18  4:55 AM  Result Value Ref Range Status   Specimen Description BLOOD RIGHT ANTECUBITAL  Final   Special Requests   Final    BOTTLES DRAWN AEROBIC AND ANAEROBIC Blood Culture adequate volume   Culture   Final    NO GROWTH 2 DAYS Performed at [facility], [place] [address] [place], [place] [address]    Report Status PENDING  Incomplete  Culture, blood (Routine X 2) w Reflex to ID Panel     Status: None (Preliminary result)   Collection Time: 04/30/18  6:33 AM  Result Value Ref Range Status   Specimen Description BLOOD RIGHT HAND  Final   Special Requests   Final    BOTTLES DRAWN AEROBIC ONLY Blood Culture adequate volume   Culture   Final    NO GROWTH 2 DAYS Performed at [facility], [place] [address], [place], [place] [address]    Report Status PENDING  Incomplete         Radiology Studies: No results found.      Scheduled Meds: . acetaminophen  500 mg Oral TID  . allopurinol  200 mg Oral Daily  . aspirin EC  81 mg Oral Daily  . famotidine  20 mg Oral QHS  . folic acid  1 mg Oral Daily  . insulin aspart  0-15 Units Subcutaneous TID WC  . insulin aspart  0-5 Units Subcutaneous QHS  . insulin glargine  5 Units Subcutaneous Daily  . levothyroxine  75 mcg Oral QAC breakfast  . lidocaine  1 patch  Transdermal Q24H  . loratadine  10 mg Oral Daily  . methylPREDNISolone  4 mg Oral 4X daily taper  . multivitamin with minerals  1 tablet Oral Daily  . polyethylene glycol  17 g Oral BID  . senna  1 tablet Oral Daily  . simvastatin  40 mg Oral Daily   Continuous Infusions:    LOS: 1 day     Vernell Leep, MD, FACP, [facility]. [facility] Pager [phone]  If 7PM-7AM, please contact night-coverage [email or web address] Password [email or web address] 05/03/2018, 12:01 PM

## 2018-05-03 NOTE — Progress Notes (Signed)
[facility] Progress Note  Subjective: feeling better, says they cut back on her pain meds yesterday  Vitals:   05/02/18 1247 05/02/18 2055 05/02/18 2055 05/03/18 0509  BP: 137/70 124/68 124/68 117/70  Pulse: (!) 101 88 85 77  Resp: _0 Temp: (!) 97.5 F (36.4 C) (!) 97.5 F (36.4 C) (!) 97.5 F (36.4 C) 97.7 F (36.5 C)  TempSrc: Oral Oral Oral Oral  SpO2: 93% 96% 97% 97%  Weight:      Height:        Inpatient medications: . acetaminophen  500 mg Oral TID  . allopurinol  200 mg Oral Daily  . aspirin EC  81 mg Oral Daily  . famotidine  20 mg Oral QHS  . folic acid  1 mg Oral Daily  . insulin aspart  0-15 Units Subcutaneous TID WC  . insulin aspart  0-5 Units Subcutaneous QHS  . insulin glargine  5 Units Subcutaneous Daily  . levothyroxine  75 mcg Oral QAC breakfast  . lidocaine  1 patch Transdermal Q24H  . loratadine  10 mg Oral Daily  . methylPREDNISolone  4 mg Oral 4X daily taper  . multivitamin with minerals  1 tablet Oral Daily  . polyethylene glycol  17 g Oral BID  . senna  1 tablet Oral Daily  . simvastatin  40 mg Oral Daily    bisacodyl, fluticasone, hydrALAZINE, methocarbamol, morphine injection, ondansetron **OR** ondansetron (ZOFRAN) IV, oxyCODONE-acetaminophen  Exam: Gen obese elderly wf no distress No jvd or bruits Chest clear bilat to bases RRR no MRG Abd soft ntnd no mass or ascites Ext trace- 1+ bilat LE edema Neuro more alert, asterixis resolved    inpatient meds:  - allopurinol/ ecasa/ folvite/ sq hep/ hydral 50 tid (dc'd yest)/ insulin ssi/ t4/ claritin/ medrol dosepak/ mvi/ kcl/ statin/ prn robaxin/ morphine iv/ percocet po prn  - voltaren gel (dc'd yest)/ pepcid po/ iv rocephin 1 gm qd x 3    date                            Creat               Egfr/ notes  nov 2018                    3.05                 15 Mar 2018                     3.8- 4.1            10- 11  Apr 29, 2018             3.89  Apr 30, 2018              4.35  May 01, 2018             5.40  jun 1 , 2019                5.24                 7               ua 5/29 > clear 100 prot many bact/ 20-50 wbc/ 0-5 rbc  echo 03/20/18 > hyperdynamic LV with severe basal septal hypertrophy and LVOT obstruction.  No valvular disease  renal US from feb 2018 > 12- 13 cm kidneys w/ no hydro  net I/O since admit is negative 700 cc (1.0 L in and 1.7 L out)  bp's - have been up and down , several low BP's in the 90's since admission  urine cx = + Ecoli     Impression: 1 acute renal failure on ckd IV - improving w/ better BP's and IVF"s, creat down 4.2 today. Baseline creat 3.5 - 4.0.  No new suggestions , will sign off.  Patient will f/u in office w/ Dr Florene Glen at [facility].    2 fall / R back pain - pain meds reduced and MS/ asterixis are better 3 ecoli UTI - on rocephin iv 4 hypertension - bp meds on hold (norvasc/ hydral) 5 hyperlipidemia 6 sp ppm - in April 7 anemia - prob related ckd , hb around 8    Plan - will sigin off   Kelly Splinter MD [place] pager [phone]   05/03/2018, 2:58 PM   Recent Labs  Lab 05/01/18 2130 05/02/18 0531 05/03/18 0649  NA 134* 132* 132*  K 4.0 4.3 5.0  CL 94* 93* 95*  CO2 22 24 21*  GLUCOSE 147* 158* 208*  BUN 94* 95* 96*  CREATININE 5.32* 5.24* 4.26*  CALCIUM 8.5* 8.5* 8.9   No results for input(s): AST, ALT, ALKPHOS, BILITOT, PROT, ALBUMIN in the last 168 hours. Recent Labs  Lab 04/29/18 1622  05/01/18 0759 05/01/18 2130 05/02/18 0531  WBC 13.0*   < > 25.8* 18.0* 18.9*  NEUTROABS 11.6*  --   --   --  17.4*  HGB 6.7*   < > 7.5* 8.3* 8.2*  HCT 20.0*   < > 22.5* 24.5* 24.3*  MCV 89.7   < > 87.9 87.2 88.4  PLT 122*   < > 93* 98* 106*   < > = values in this interval not displayed.   Iron/TIBC/Ferritin/ %Sat    Component Value Date/Time   IRON 16 (L) 04/29/2018 1942   TIBC 235 (L) 04/29/2018 1942   FERRITIN 204 04/29/2018 1942   IRONPCTSAT 7 (L) 04/29/2018 1942

## 2018-05-04 LAB — CBC WITH DIFFERENTIAL/PLATELET
BASOS PCT: 0 %
Basophils Absolute: 0 10*3/uL (ref 0.0–0.1)
EOS PCT: 0 %
Eosinophils Absolute: 0 10*3/uL (ref 0.0–0.7)
HCT: 24.1 % — ABNORMAL LOW (ref 36.0–46.0)
HEMOGLOBIN: 8.2 g/dL — AB (ref 12.0–15.0)
LYMPHS PCT: 7 %
Lymphs Abs: 1 10*3/uL (ref 0.7–4.0)
MCH: 29.7 pg (ref 26.0–34.0)
MCHC: 34 g/dL (ref 30.0–36.0)
MCV: 87.3 fL (ref 78.0–100.0)
MONO ABS: 0.6 10*3/uL (ref 0.1–1.0)
Monocytes Relative: 4 %
NEUTROS ABS: 13.1 10*3/uL — AB (ref 1.7–7.7)
NEUTROS PCT: 89 %
Platelets: 177 10*3/uL (ref 150–400)
RBC: 2.76 MIL/uL — ABNORMAL LOW (ref 3.87–5.11)
RDW: 13.2 % (ref 11.5–15.5)
WBC Morphology: INCREASED
WBC: 14.7 10*3/uL — ABNORMAL HIGH (ref 4.0–10.5)

## 2018-05-04 LAB — BASIC METABOLIC PANEL
Anion gap: 16 — ABNORMAL HIGH (ref 5–15)
BUN: 106 mg/dL — AB (ref 6–20)
CHLORIDE: 92 mmol/L — AB (ref 101–111)
CO2: 22 mmol/L (ref 22–32)
Calcium: 9.3 mg/dL (ref 8.9–10.3)
Creatinine, Ser: 3.92 mg/dL — ABNORMAL HIGH (ref 0.44–1.00)
GFR calc Af Amer: 12 mL/min — ABNORMAL LOW (ref >60)
GFR calc non Af Amer: 10 mL/min — ABNORMAL LOW (ref >60)
GLUCOSE: 317 mg/dL — AB (ref 65–99)
Potassium: 5 mmol/L (ref 3.5–5.1)
Sodium: 130 mmol/L — ABNORMAL LOW (ref 135–145)

## 2018-05-04 LAB — GLUCOSE, CAPILLARY
GLUCOSE-CAPILLARY: 318 mg/dL — AB (ref 65–99)
GLUCOSE-CAPILLARY: 265 mg/dL — AB (ref 65–99)
Glucose-Capillary: 316 mg/dL — ABNORMAL HIGH (ref 65–99)
Glucose-Capillary: 270 mg/dL — ABNORMAL HIGH (ref 65–99)
Glucose-Capillary: 258 mg/dL — ABNORMAL HIGH (ref 65–99)

## 2018-05-04 MED ORDER — INSULIN GLARGINE 100 UNIT/ML ~~LOC~~ SOLN
10.0000 [IU] | Freq: Once | SUBCUTANEOUS | Status: AC
  Administered 2018-05-04: 10 [IU] via SUBCUTANEOUS
  Filled 2018-05-04: qty 0.1

## 2018-05-04 MED ORDER — BISACODYL 10 MG RE SUPP
10.0000 mg | Freq: Once | RECTAL | Status: AC
Start: 2018-05-04 — End: 2018-05-04
  Administered 2018-05-04: 10 mg via RECTAL
  Filled 2018-05-04: qty 1

## 2018-05-04 NOTE — Progress Notes (Signed)
Physical Therapy Treatment Patient Details Name: Erika Fry MRN: [id number] DOB: 15-Mar-1946 Today's Date: 05/04/2018    History of Present Illness Pt. is a 72 y.o. F with significant PMH of hypertension, hyperlipidemia, diet controlled diabetes, GERD, hypothyroidism, gout, CKD-4, iron deficiency anemia, second degree AV block, pacemaker placement, aortic stenosis, who presents after a fall and right lower back pain. CT of lumbar spine and pelvis negative for acute bony fracture.    PT Comments    Patient seen for activity progression. Remains limited by pain and continues to require increased physical assist. Ambulated in room with RW but only able to tolerate very short distance. Current POC remains appropriate.   Follow Up Recommendations  SNF     Equipment Recommendations  Rolling walker with 5" wheels;3in1 (PT)    Recommendations for Other Services       Precautions / Restrictions Precautions Precautions: Fall Precaution Comments: Pacemaker placement 4 weeks ago Restrictions Weight Bearing Restrictions: No    Mobility  Bed Mobility Overal bed mobility: Needs Assistance Bed Mobility: Rolling;Sidelying to Sit Rolling: Min assist;+2 for physical assistance;+2 for safety/equipment Sidelying to sit: +2 for physical assistance;Mod assist     Sit to sidelying: Max assist;+2 for physical assistance General bed mobility comments: Increased time and effort to perform, limited compliance with log roll technique, assist to elevate trunk to upright and rotate hips to EOB  Transfers Overall transfer level: Needs assistance Equipment used: Rolling walker (2 wheeled) Transfers: Sit to/from Stand Sit to Stand: +2 physical assistance;Mod assist;From elevated surface         General transfer comment: assist to rise and steady, cues for hand placement, stood from slightly elevated bed to stand at [facility] with use of Stedy for transfer to recliner; pt able to maintain static  standing approx 30 sec-64min after initial standing prior to sitting   Ambulation/Gait Ambulation/Gait assistance: Min assist;+2 safety/equipment Ambulation Distance (Feet): 16 Feet Assistive device: Rolling walker (2 wheeled) Gait Pattern/deviations: Trunk flexed Gait velocity: decreased Gait velocity interpretation: <1.31 ft/sec, indicative of household ambulator General Gait Details: Multi modal cues for posture and positioning. Limited by pain, increased time and effort to perform   Stairs             Wheelchair Mobility    Modified Rankin (Stroke Patients Only)       Balance Overall balance assessment: Needs assistance Sitting-balance support: Feet supported Sitting balance-Leahy Scale: Fair Sitting balance - Comments: maintains static sitting with close minguard for safety    Standing balance support: Bilateral upper extremity supported Standing balance-Leahy Scale: Poor Standing balance comment: heavy reliance on UE support             High level balance activites: Side stepping;Direction changes;Turns High Level Balance Comments: Moderate assist for stability            Cognition Arousal/Alertness: Awake/alert Behavior During Therapy: WFL for tasks assessed/performed Overall Cognitive Status: Within Functional Limits for tasks assessed                                        Exercises      General Comments        Pertinent Vitals/Pain Pain Assessment: Faces Faces Pain Scale: Hurts whole lot(2 at rest prior to activity) Pain Location: right low back Pain Descriptors / Indicators: Aching;Grimacing;Guarding;Shooting Pain Intervention(s): Monitored during session    Home Living  Prior Function            PT Goals (current goals can now be found in the care plan section) Acute Rehab PT Goals Patient Stated Goal: decrease pain PT Goal Formulation: With patient Time For Goal Achievement:  05/14/18 Potential to Achieve Goals: Fair Progress towards PT goals: Progressing toward goals    Frequency    Min 3X/week      PT Plan Current plan remains appropriate    Co-evaluation              AM-PAC PT "6 Clicks" Daily Activity  Outcome Measure  Difficulty turning over in bed (including adjusting bedclothes, sheets and blankets)?: Unable Difficulty moving from lying on back to sitting on the side of the bed? : Unable Difficulty sitting down on and standing up from a chair with arms (e.g., wheelchair, bedside commode, etc,.)?: Unable Help needed moving to and from a bed to chair (including a wheelchair)?: A Little Help needed walking in hospital room?: A Lot Help needed climbing 3-5 steps with a railing? : Total 6 Click Score: 9    End of Session Equipment Utilized During Treatment: Gait belt Activity Tolerance: Patient limited by pain Patient left: in bed;with call bell/phone within reach;with bed alarm set Nurse Communication: Mobility status PT Visit Diagnosis: Other abnormalities of gait and mobility (R26.89);Pain Pain - part of body: (back)     Time: 2072-1828 PT Time Calculation (min) (ACUTE ONLY): 22 min  Charges:  $Therapeutic Activity: 8-22 mins                    G Codes:       Alben Deeds, PT DPT  Board Certified Neurologic Specialist [place] 05/04/2018, 3:56 PM

## 2018-05-04 NOTE — Progress Notes (Signed)
Inpatient Diabetes Program Recommendations  AACE/ADA: New Consensus Statement on Inpatient Glycemic Control (2015)  Target Ranges:  Prepandial:   less than 140 mg/dL      Peak postprandial:   less than 180 mg/dL (1-2 hours)      Critically ill patients:  140 - 180 mg/dL   Lab Results  Component Value Date   GLUCAP 316 (H) 05/04/2018   HGBA1C 6.3 (H) 04/30/2018    Review of Glycemic Control  Diabetes history: DM 2 Outpatient Diabetes medications: Diet controlled Current orders for Inpatient glycemic control: Lantus 5 units, Novolog Moderate Correction 0-15 units tid + Novolog HS scale 0-5 units  A1c 6.3% on 5/30   Inpatient Diabetes Program Recommendations:    Patient on steroid pack. Glucose increased into 300 range this am. If patient remains at same dose, Consider increasing Lantus to 12-15 units. Could also add Novolog  Meal coverage 3 units if patient consumes at least 50% of meals.   Thanks,  Tama Headings RN, MSN, BC-ADM, [facility] Inpatient Diabetes Coordinator Team Pager [phone] (8a-5p)

## 2018-05-04 NOTE — Progress Notes (Signed)
CSW updated patient that Rober Minion will be ready for patient possibly tomorrow if medially stable.   Percell Locus Nicoli Nardozzi LCSW [phone]

## 2018-05-04 NOTE — Progress Notes (Signed)
Occupational Therapy Treatment Patient Details Name: Erika Fry MRN: [id number] DOB: 02-16-1946 Today's Date: 05/04/2018    History of present illness Pt. is a 72 y.o. F with significant PMH of hypertension, hyperlipidemia, diet controlled diabetes, GERD, hypothyroidism, gout, CKD-4, iron deficiency anemia, second degree AV block, pacemaker placement, aortic stenosis, who presents after a fall and right lower back pain. CT of lumbar spine and pelvis negative for acute bony fracture.   OT comments  Pt progressing towards OT goals, presents supine in bed pleasant and willing to work with therapy. Pt continues to have pain in R lower back impacting her overall functional performance, though reports pain has improved since previous therapy sessions. Pt requiring maxA+2 for bed mobility, ModA+2 for sit<>stand at [facility] this session with use of Stedy for transfer OOB to recliner. Pt with decreased activity tolerance and easily fatigued with minimal activity. Continue to recommend SNF level therapies at time of discharge. Will continue to follow acutely to progress pt towards established OT goals.    Follow Up Recommendations  SNF;Supervision/Assistance - 24 hour    Equipment Recommendations  3 in 1 bedside commode;Other (comment)(to be further assessed in next venue )          Precautions / Restrictions Precautions Precautions: Fall Precaution Comments: Pacemaker placement 4 weeks ago Restrictions Weight Bearing Restrictions: No       Mobility Bed Mobility Overal bed mobility: Needs Assistance Bed Mobility: Rolling;Sidelying to Sit Rolling: Max assist;+2 for physical assistance;+2 for safety/equipment Sidelying to sit: +2 for physical assistance;Max assist       General bed mobility comments: cues for log roll technique, increased time, assist to raise trunk and assist LEs over EOB  Transfers Overall transfer level: Needs assistance Equipment used: Ambulation equipment  used Transfers: Sit to/from Stand Sit to Stand: +2 physical assistance;Mod assist;From elevated surface         General transfer comment: assist to rise and steady, cues for hand placement, stood from slightly elevated bed to stand at [facility] with use of Stedy for transfer to recliner; pt able to maintain static standing approx 30 sec-70min after initial standing prior to sitting     Balance Overall balance assessment: Needs assistance Sitting-balance support: Feet supported Sitting balance-Leahy Scale: Fair Sitting balance - Comments: maintains static sitting with close minguard for safety    Standing balance support: Bilateral upper extremity supported Standing balance-Leahy Scale: Poor Standing balance comment: reliant on UE support                           ADL either performed or assessed with clinical judgement   ADL Overall ADL's : Needs assistance/impaired Eating/Feeding: Set up;Sitting Eating/Feeding Details (indicate cue type and reason): setup to remove lids from plate Grooming: Brushing hair;Set up;Min guard;Sitting;Bed level Grooming Details (indicate cue type and reason): pt completing brushing hair upon entering room                                General ADL Comments: pt continues to have limitations due to pain, completed bed mobility and use of Stedy for transfer to recliner this session with ModA+2 for sit<>stand at [facility]; pt easily fatigued with minimal activity                        Cognition Arousal/Alertness: Awake/alert Behavior During Therapy: [facility] for tasks assessed/performed Overall Cognitive  Status: Within Functional Limits for tasks assessed                                                            Pertinent Vitals/ Pain       Pain Assessment: Faces Faces Pain Scale: Hurts whole lot Pain Location: right low back Pain Descriptors / Indicators: Aching;Grimacing;Guarding;Shooting Pain  Intervention(s): Monitored during session;Limited activity within patient's tolerance;Repositioned                                                          Frequency  Min 2X/week        Progress Toward Goals  OT Goals(current goals can now be found in the care plan section)  Progress towards OT goals: Progressing toward goals  Acute Rehab OT Goals Patient Stated Goal: decrease pain OT Goal Formulation: With patient Time For Goal Achievement: 05/14/18 Potential to Achieve Goals: Good  Plan Discharge plan remains appropriate                     AM-PAC PT "6 Clicks" Daily Activity     Outcome Measure   Help from another person eating meals?: None Help from another person taking care of personal grooming?: A Lot Help from another person toileting, which includes using toliet, bedpan, or urinal?: Total Help from another person bathing (including washing, rinsing, drying)?: A Lot Help from another person to put on and taking off regular upper body clothing?: A Lot Help from another person to put on and taking off regular lower body clothing?: Total 6 Click Score: 12    End of Session Equipment Utilized During Treatment: Gait belt(Stedy )  OT Visit Diagnosis: Unsteadiness on feet (R26.81);Pain Pain - part of body: (back (R lower portion) )   Activity Tolerance Patient limited by pain   Patient Left with call bell/phone within reach;in chair;with chair alarm set   Nurse Communication Mobility status;Need for lift equipment        Time: 2774-1287 OT Time Calculation (min): 30 min  Charges: OT General Charges $OT Visit: 1 Visit OT Treatments $Self Care/Home Management : 23-37 mins  Lou Cal, OT Pager [phone] 05/04/2018    Raymondo Band 05/04/2018, 10:08 AM

## 2018-05-04 NOTE — Progress Notes (Signed)
PROGRESS NOTE   Erika Fry  QJJ:[id number]    DOB: 11/16/1946    DOA: 04/29/2018  PCP: [name], MD   I have briefly reviewed patients previous medical records in [facility].  Brief Narrative:  72 year old female with PMH of HTN, HLD, diet-controlled DM, GERD, hypothyroid, gout, stage IV chronic kidney disease, iron deficiency anemia, second-degree AV block, recent pacemaker placement 03/25/2018 (Dr. Crissie Sickles, Dr. Adrian Prows, Cardiology) slipped out of her recliner at home, sustained fall and laid on the floor for about 3 hours and presented to ED with acute right lower back pain.  CT and pelvis without acute findings.  Admitted for acute right low back pain with sciatica. Ongoing back pain without much relief. Unable to do MRI due to pacer dependent per d/w Cardiology.  Steroid Dosepak initiated.  Nephrology consulted for acute on chronic kidney disease.  Slowly improving.   Assessment & Plan:   Principal Problem:   Fall Active Problems:   CKD (chronic kidney disease), stage IV (HCC)   Diet-controlled diabetes mellitus (HCC)   GERD (gastroesophageal reflux disease)   Gout   High cholesterol   Hypothyroidism   Iron deficiency anemia   Essential hypertension   Back pain   Leukocytosis   Hypokalemia   UTI (urinary tract infection)   Acute low back pain with sciatica   Acute kidney injury superimposed on chronic kidney disease (HCC)   Toxic metabolic encephalopathy   Acute right low back pain with sciatica: Sustained after mechanical fall from recliner at home.  CT lumbar spine and pelvis without acute findings.  Possibly from bone or muscle bruising/spasm.  No neurological deficits on exam.  Despite treating supportively with heat pack, pain management with opioids, muscle relaxants, lidocaine, had ongoing significant pain without relief.  Unable to perform MRI due to recent permanent pacemaker (discussed with her cardiologist) or CT myelogram due to renal  insufficiency.  Started Medrol Dosepak.   I discussed with neurosurgeon on-call on 5/31.  I reviewed the case with him and he was able to see patients admission CT images.  He recommended continue current management including steroids, rest, PT, attempt to improve renal functions and if does not get better then may need to consider CT myelogram next week with minimal contrast.  Continues to gradually improve.  Continue current management and therapy evaluation.  Suspected E. coli acute cystitis: Completed 3 days of IV ceftriaxone-discontinued.  Normocytic anemia: Could be from anemia of renal insufficiency, chronic disease versus iron deficiency.  Iron 16, ferritin 204.  Folate 69 and B12 >7500.  Hemoglobin has dropped from 8.2-7.5 in the absence of overt bleeding.  Hemoglobin stable in the low 8 g range for the last 3 days.  Follow CBC periodically.  Thrombocytopenia, mild: Unclear etiology.  Platelet count seems to have improved yesterday but again has dropped from 135-93.  Heparin was discontinued.  Thrombocytopenia resolved.  Hypokalemia: Replaced.  Magnesium normal.  Acute on stage 4 chronic kidney disease: Follows with Dr. Florene Glen, Nephrology.  Presented with creatinine of 3.8 which increased to 4.3 and then to 5.4.  Could be related to poor oral intake or hemodynamic/soft blood pressures.  Not on ACEI/ARB or NSAIDs.  Discontinued antihypertensives and Lasix.  Brief and gentle IV fluids, creatinine slightly better but still significantly elevated at 5.32.  Nephrology consultation appreciated.  Creatinine is down from 5.24-4.26.  Discontinued IV fluids.  Encourage oral intake.  Continue to trend daily BMP.  Continues to improve, creatinine down to  3.92.  Hold Lasix for 1 more day and then consider resuming.  Diet controlled DM 2: A1c 6.3.  CBGs uncontrolled in the 200 range and up to 316 this morning.  Secondary to steroids.  Received Lantus 5 units this morning, added one-time dose of Lantus 10  units to make total of 15 units this morning.  Continue SSI.  With taper of steroids, should improve.  Diabetes coordinator input appreciated.   Essential hypertension: Due to soft blood pressures, antihypertensives including amlodipine and hydralazine were held.  Lasix also on hold.  Hypotension resolved.  PRN hydralazine for now.  Continue to hold medications.  Blood pressures controlled without them.  Stable.  Hyperlipidemia: Continue statins.  Hypothyroid: Continue Synthroid.  Clinically euthyroid.  GERD: PPI.  Status post recent pacemaker placement.  Leukocytosis: Unclear etiology.Progressively increasing 13>24>25.8. UTI now treated. No diarrhea or other symptoms suggestive of infection were reported. No likely to worsen with steroids.  Leukocytosis has improved from 32.9-92.4, neutrophilic.  Morphology unremarkable.  WBC down to 14.7.  Acute respiratory failure with hypoxia: Likely related to sedation from medications, minimized.  Lungs clinically clear.  Incentive spirometry.  Oxygen supplements as needed.  Wean off of oxygen as tolerated.  Acute toxic metabolic encephalopathy: Her somnolence is likely multifactorial related to opioid pain medications and worsening renal functions.  Treat as above and monitor closely.  Resolved.  Constipation: Adjusted bowel regimen.  Mobile eyes.   DVT prophylaxis: Subcutaneous heparin - DC'ed. SCD's Code Status: Full Family Communication: None at bedside today. Disposition: DC likely to SNF pending clinical improvement, possibly 6/4   Consultants:  Nephrology  Procedures:  None  Antimicrobials:  Completed 3 days of IV ceftriaxone   Subjective: States that back pain continues to improve.  Feels much better today.  Sitting up on chair.  Worked with PT.  No BM yet and willing to try suppository.  ROS: As above.  Objective:  Vitals:   05/03/18 2113 05/03/18 2113 05/04/18 0431 05/04/18 1340  BP: 129/71 129/71 (!) 142/77 (!) 151/80    Pulse: 84 83 72 68  Resp:    16  Temp: 98.6 F (37 C) 98.6 F (37 C) 97.8 F (36.6 C) 98.1 F (36.7 C)  TempSrc: Oral Oral Oral Oral  SpO2: 96% 97% 97% 100%  Weight:      Height:        Examination:  General exam: Pleasant elderly female, moderately built and obese, sitting up comfortably in reclining chair. Respiratory system: Clear to auscultation. Respiratory effort normal.  Stable. Cardiovascular system: S1 & S2 heard, RRR. No JVD, murmurs, rubs, gallops or clicks. No pedal edema.  Telemetry personally reviewed: V paced rhythm. Gastrointestinal system: Abdomen is nondistended, soft and nontender. No organomegaly or masses felt. Normal bowel sounds heard.  Stable Central nervous system: Alert and oriented x3. No focal neurological deficits.  Stable Extremities: Symmetric 5 x 5 power in upper extremities.  Able to move her lower extremities much better today.  Grade 4+ by 5 power at least. Skin: No rashes, lesions or ulcers Psychiatry: Judgement and insight appear normal. Mood & affect appropriate. Musculoskeletal: Mild right lower lumbar paraspinal muscle tenderness/spasm without deformities.  I examined in detail along with patient's RN, turn patient over in bed, no external skin abnormalities noted.  Minimal sacral edema.  Not examined today.    Data Reviewed: I have personally reviewed following labs and imaging studies  CBC: Recent Labs  Lab 04/29/18 1622  04/30/18 0455 05/01/18 0759 05/01/18 2130  05/02/18 0531 05/04/18 0703  WBC 13.0*  --  24.3* 25.8* 18.0* 18.9* 14.7*  NEUTROABS 11.6*  --   --   --   --  17.4* 13.1*  HGB 6.7*   < > 8.2* 7.5* 8.3* 8.2* 8.2*  HCT 20.0*   < > 24.5* 22.5* 24.5* 24.3* 24.1*  MCV 89.7  --  88.4 87.9 87.2 88.4 87.3  PLT 122*  --  135* 93* 98* 106* 177   < > = values in this interval not displayed.   Basic Metabolic Panel: Recent Labs  Lab 04/29/18 1942  05/01/18 0759 05/01/18 2130 05/02/18 0531 05/03/18 0649 05/04/18 0703   NA  --    < > 132* 134* 132* 132* 130*  K  --    < > 4.2 4.0 4.3 5.0 5.0  CL  --    < > 93* 94* 93* 95* 92*  CO2  --    < > 24 22 24  21* 22  GLUCOSE  --    < > 181* 147* 158* 208* 317*  BUN  --    < > 88* 94* 95* 96* 106*  CREATININE  --    < > 5.40* 5.32* 5.24* 4.26* 3.92*  CALCIUM  --    < > 8.4* 8.5* 8.5* 8.9 9.3  MG 2.0  --   --   --   --   --   --    < > = values in this interval not displayed.   Cardiac Enzymes: Recent Labs  Lab 04/29/18 1622  CKTOTAL 81   HbA1C: No results for input(s): HGBA1C in the last 72 hours. CBG: Recent Labs  Lab 05/03/18 1244 05/03/18 1709 05/03/18 2114 05/04/18 0753 05/04/18 1203  GLUCAP 210* 253* 298* 316* 270*    Recent Results (from the past 240 hour(s))  Urine Culture     Status: Abnormal   Collection Time: 04/30/18  3:17 AM  Result Value Ref Range Status   Specimen Description URINE, RANDOM  Final   Special Requests   Final    NONE Performed at [facility], [place] [address], [place], [place] [address]    Culture >=100,000 COLONIES/mL ESCHERICHIA COLI (A)  Final   Report Status 05/02/2018 FINAL  Final   Organism ID, Bacteria ESCHERICHIA COLI (A)  Final      Susceptibility   Escherichia coli - MIC*    AMPICILLIN >=32 RESISTANT Resistant     CEFAZOLIN <=4 SENSITIVE Sensitive     CEFTRIAXONE <=1 SENSITIVE Sensitive     CIPROFLOXACIN <=0.25 SENSITIVE Sensitive     GENTAMICIN <=1 SENSITIVE Sensitive     IMIPENEM <=0.25 SENSITIVE Sensitive     NITROFURANTOIN <=16 SENSITIVE Sensitive     TRIMETH/SULFA >=320 RESISTANT Resistant     AMPICILLIN/SULBACTAM >=32 RESISTANT Resistant     PIP/TAZO <=4 SENSITIVE Sensitive     Extended ESBL NEGATIVE Sensitive     * >=100,000 COLONIES/mL ESCHERICHIA COLI  MRSA PCR Screening     Status: None   Collection Time: 04/30/18  3:35 AM  Result Value Ref Range Status   MRSA by PCR NEGATIVE NEGATIVE Final    Comment:        The GeneXpert MRSA Assay (FDA approved for NASAL specimens only),  is one component of a comprehensive MRSA colonization surveillance program. It is not intended to diagnose MRSA infection nor to guide or monitor treatment for MRSA infections. Performed at [facility], [place] [address] [place], [place] [address]  Culture, blood (Routine X 2) w Reflex to ID Panel     Status: None (Preliminary result)   Collection Time: 04/30/18  4:55 AM  Result Value Ref Range Status   Specimen Description BLOOD RIGHT ANTECUBITAL  Final   Special Requests   Final    BOTTLES DRAWN AEROBIC AND ANAEROBIC Blood Culture adequate volume   Culture   Final    NO GROWTH 4 DAYS Performed at [facility], [address] [place], [place] [address]    Report Status PENDING  Incomplete  Culture, blood (Routine X 2) w Reflex to ID Panel     Status: None (Preliminary result)   Collection Time: 04/30/18  6:33 AM  Result Value Ref Range Status   Specimen Description BLOOD RIGHT HAND  Final   Special Requests   Final    BOTTLES DRAWN AEROBIC ONLY Blood Culture adequate volume   Culture   Final    NO GROWTH 4 DAYS Performed at [facility], [place] [address] [place], [place] [address]    Report Status PENDING  Incomplete         Radiology Studies: No results found.      Scheduled Meds: . acetaminophen  500 mg Oral TID  . allopurinol  200 mg Oral Daily  . aspirin EC  81 mg Oral Daily  . famotidine  20 mg Oral QHS  . folic acid  1 mg Oral Daily  . insulin aspart  0-15 Units Subcutaneous TID WC  . insulin aspart  0-5 Units Subcutaneous QHS  . insulin glargine  5 Units Subcutaneous Daily  . levothyroxine  75 mcg Oral QAC breakfast  . lidocaine  1 patch Transdermal Q24H  . loratadine  10 mg Oral Daily  . methylPREDNISolone  4 mg Oral 4X daily taper  . multivitamin with minerals  1 tablet Oral Daily  . polyethylene glycol  17 g Oral BID  . senna  1 tablet Oral Daily  . simvastatin  40 mg Oral Daily   Continuous Infusions:    LOS: 2 days      Vernell Leep, MD, FACP, [facility]. [facility] Pager [phone]  If 7PM-7AM, please contact night-coverage [email or web address] Password [facility] 05/04/2018, 3:32 PM

## 2018-05-05 LAB — CBC
HEMATOCRIT: 24.5 % — AB (ref 36.0–46.0)
HEMOGLOBIN: 8.3 g/dL — AB (ref 12.0–15.0)
MCH: 29.9 pg (ref 26.0–34.0)
MCHC: 33.9 g/dL (ref 30.0–36.0)
MCV: 88.1 fL (ref 78.0–100.0)
Platelets: 199 10*3/uL (ref 150–400)
RBC: 2.78 MIL/uL — ABNORMAL LOW (ref 3.87–5.11)
RDW: 13 % (ref 11.5–15.5)
WBC: 20.9 10*3/uL — AB (ref 4.0–10.5)

## 2018-05-05 LAB — BASIC METABOLIC PANEL
Anion gap: 12 (ref 5–15)
BUN: 116 mg/dL — ABNORMAL HIGH (ref 6–20)
CO2: 23 mmol/L (ref 22–32)
Calcium: 9.5 mg/dL (ref 8.9–10.3)
Chloride: 93 mmol/L — ABNORMAL LOW (ref 101–111)
Creatinine, Ser: 3.77 mg/dL — ABNORMAL HIGH (ref 0.44–1.00)
GFR calc Af Amer: 13 mL/min — ABNORMAL LOW (ref >60)
GFR calc non Af Amer: 11 mL/min — ABNORMAL LOW (ref >60)
Glucose, Bld: 265 mg/dL — ABNORMAL HIGH (ref 65–99)
Potassium: 4.9 mmol/L (ref 3.5–5.1)
Sodium: 128 mmol/L — ABNORMAL LOW (ref 135–145)

## 2018-05-05 LAB — CULTURE, BLOOD (ROUTINE X 2)
CULTURE: NO GROWTH
CULTURE: NO GROWTH
SPECIAL REQUESTS: ADEQUATE
Special Requests: ADEQUATE

## 2018-05-05 LAB — GLUCOSE, CAPILLARY
GLUCOSE-CAPILLARY: 235 mg/dL — AB (ref 65–99)
Glucose-Capillary: 240 mg/dL — ABNORMAL HIGH (ref 65–99)

## 2018-05-05 MED ORDER — ALLOPURINOL 100 MG PO TABS: 200.0000 mg | ORAL_TABLET | Freq: Every day | ORAL | Status: DC

## 2018-05-05 MED ORDER — INSULIN GLARGINE 100 UNIT/ML ~~LOC~~ SOLN: 5.0000 [IU] | Freq: Every day | SUBCUTANEOUS | Status: DC

## 2018-05-05 MED ORDER — SENNA 8.6 MG PO TABS: 1.0000 | ORAL_TABLET | Freq: Every day | ORAL | Status: DC

## 2018-05-05 MED ORDER — OXYCODONE-ACETAMINOPHEN 5-325 MG PO TABS: 1.0000 | ORAL_TABLET | Freq: Two times a day (BID) | ORAL | 0 refills | Status: DC | PRN

## 2018-05-05 MED ORDER — INSULIN ASPART 100 UNIT/ML ~~LOC~~ SOLN: 0.0000 [IU] | Freq: Every day | SUBCUTANEOUS | Status: DC

## 2018-05-05 MED ORDER — INSULIN GLARGINE 100 UNIT/ML ~~LOC~~ SOLN
15.0000 [IU] | Freq: Every day | SUBCUTANEOUS | Status: DC
  Administered 2018-05-05: 15 [IU] via SUBCUTANEOUS
  Filled 2018-05-05: qty 0.15

## 2018-05-05 MED ORDER — INSULIN ASPART 100 UNIT/ML ~~LOC~~ SOLN: 0.0000 [IU] | Freq: Three times a day (TID) | SUBCUTANEOUS | Status: DC

## 2018-05-05 MED ORDER — ACETAMINOPHEN 325 MG PO TABS: 650.0000 mg | ORAL_TABLET | Freq: Four times a day (QID) | ORAL | Status: DC | PRN

## 2018-05-05 MED ORDER — METHYLPREDNISOLONE 4 MG PO TBPK: ORAL_TABLET | ORAL | Status: DC

## 2018-05-05 MED ORDER — POLYETHYLENE GLYCOL 3350 17 G PO PACK: 17.0000 g | PACK | Freq: Two times a day (BID) | ORAL | Status: DC

## 2018-05-05 MED ORDER — BISACODYL 10 MG RE SUPP: 10.0000 mg | Freq: Every day | RECTAL | Status: DC | PRN

## 2018-05-05 NOTE — Progress Notes (Signed)
Discharge to: [facility] Anticipated discharge date: 05/05/18 Transportation by: [facility]  Report #: [id number], Room [place], Please call patient's daughter-in-law Sonia Baller at [phone] when Corey Harold arrives  CSW signing off.  Laveda Abbe LCSW [phone]

## 2018-05-05 NOTE — Discharge Summary (Signed)
Physician Discharge Summary  Erika Fry GEX:[id number] DOB: 28-Dec-1945  PCP: [name], MD  Admit date: 04/29/2018 Discharge date: 05/05/2018  Recommendations for Outpatient Follow-up:  1. MD at SNF in 3 days with repeat labs (CBC & renal panel). 2. Dr. Erling Cruz, Nephrology in 1 week.  SNF to coordinate. 3. Dr. [name], PCP upon discharge from SNF.   4. Dr. Adrian Prows, Cardiology 5. Consider outpatient Orthopedic/spine surgery consultation if patient's low back pain does not continue to consistently improve, recurs or worsens. 6. Monitor CBGs closely and adjust/wean off of insulins as needed.  Patient was not on any hypoglycemics prior to hospitalization.  Current hyperglycemia precipitated by steroids used for low back pain.  Home Health: N/A.  Patient being discharged to [facility]. Equipment/Devices: Rolling walker with 5 inch wheels & 3 in 1.  Discharge Condition: Improved and stable. CODE STATUS: Full. Diet recommendation: Heart healthy and diabetic diet.  Discharge Diagnoses:  Principal Problem:   Fall Active Problems:   CKD (chronic kidney disease), stage IV (HCC)   Diet-controlled diabetes mellitus (HCC)   GERD (gastroesophageal reflux disease)   Gout   High cholesterol   Hypothyroidism   Iron deficiency anemia   Essential hypertension   Back pain   Leukocytosis   Hypokalemia   UTI (urinary tract infection)   Acute low back pain with sciatica   Acute kidney injury superimposed on chronic kidney disease (HCC)   Toxic metabolic encephalopathy   Brief Summary: 72 year old female with PMH of HTN, HLD, diet-controlled DM, GERD, hypothyroid, gout, stage IV chronic kidney disease, iron deficiency anemia, second-degree AV block, recent pacemaker placement 03/25/2018 (Dr. Crissie Sickles, Dr. Adrian Prows, Cardiology) slipped out of her recliner at home, sustained fall and laid on the floor for about 3 hours and presented to ED with acute right lower back  pain.  CT and pelvis without acute findings.  Admitted for acute right low back pain with sciatica. Ongoing back pain without much relief. Unable to do MRI due to pacer dependent per d/w Cardiology.  Steroid Dosepak initiated.  Nephrology consulted for acute on chronic kidney disease.     Assessment & Plan:   Acute right low back pain with sciatica: Sustained after mechanical fall from recliner at home.  CT lumbar spine and pelvis without acute findings. No neurological deficits on exam.  Despite treating supportively with heat pack, pain management with opioids, muscle relaxants, lidocaine patch, had ongoing significant pain without relief.  Unable to perform MRI due to recent permanent pacemaker (discussed with her cardiologist) or CT myelogram due to renal insufficiency.  Started Medrol Dosepak.  I discussed with Neurosurgeon on-call on 5/31 who agreed with ongoing treatment.  Patient has progressively improved with significant improvement in pain and mobility.  Complete Medrol Dosepak 4 mg that was started in the hospital on 05/03/2018.  Continue physical therapy.  In case patient's pain does not consistently continue to improve or gets worse, recommend outpatient Orthopedic/Neurosurgery consultation for further evaluation and management.  Suspected E. coli acute cystitis: Completed treatment with 3 days of IV ceftriaxone.  Normocytic anemia: Could be from anemia of renal insufficiency, chronic disease versus iron deficiency.  Iron 16, ferritin 204.  Folate 69 and B12 >7500.  Hemoglobin stable in the low 8 g range for the last several days.  Follow CBC periodically.  Thrombocytopenia, mild: Unclear etiology.  Heparin was discontinued.  Thrombocytopenia resolved.  Hypokalemia: Replaced.  Magnesium normal.  Acute on stage 4 chronic kidney disease: Follows  with Dr. Florene Glen, Nephrology.  Presented with creatinine of 3.8 which increased to 4.3 and then to 5.4.  Could be related to poor oral intake  or hemodynamic/soft blood pressures.  Not on ACEI/ARB or NSAIDs.    Briefly held antihypertensives and Lasix.  Brief and gentle IV fluids.  Creatinine has improved to 3.7 and baseline.  As per nephrology, baseline creatinine between three-point 5-4.  Resume Lasix at discharge.  Follow BMP closely at SNF and follow-up with Dr. Florene Glen as outpatient.  Diet controlled DM 2: A1c 6.3.    Patient was not on hypoglycemics PTA.  Now hyperglycemic secondary to steroids initiated for low back pain.  Started on Lantus and SSI.  CBGs starting to improve with reduction in steroid dose.  Follow closely at SNF and adjust/taper down insulins as needed to eventually discontinue.   Essential hypertension: Due to soft blood pressures, antihypertensives including amlodipine and hydralazine were held.  Hypotension resolved.    Resume amlodipine and hydralazine at discharge.  Hyperlipidemia: Continue statins.  Hypothyroid: Continue Synthroid.  Clinically euthyroid.  GERD: PPI.  Status post recent pacemaker placement.  Primary cardiologist aware of patient's admission.  Outpatient follow-up with Dr. Einar Gip.  Leukocytosis: Unclear etiology.Progressively increased 13>24>25.8. UTI now treated. No diarrhea or other symptoms suggestive of infection were reported.  WBC 21, now likely related to steroids.  Follow CBCs closely post completion of steroids for normalization.  Acute respiratory failure with hypoxia: Likely related to sedation from medications, minimized.  Lungs clinically clear.  Incentive spirometry.    Resolved.  Currently saturating at 97% on room air.  Acute toxic metabolic encephalopathy: Her somnolence was likely multifactorial related to opioid pain medications and worsening renal functions. Resolved.  Constipation: Adjusted bowel regimen.  Patient had BM after Dulcolax suppository on 6/3.  Hyponatremia: Likely multifactorial.  Some of this is pseudohyponatremia from hyperglycemia.  Slightly  volume overloaded.  Resume Lasix.  Follow [facility] a [facility].   Consultants:  Nephrology  Procedures:  None    Discharge Instructions  Discharge Instructions    (HEART FAILURE PATIENTS) Call MD:  Anytime you have any of the following symptoms: 1) 3 pound weight gain in 24 hours or 5 pounds in 1 week 2) shortness of breath, with or without a dry hacking cough 3) swelling in the hands, feet or stomach 4) if you have to sleep on extra pillows at night in order to breathe.   Complete by:  As directed    Call MD for:  difficulty breathing, headache or visual disturbances   Complete by:  As directed    Call MD for:  extreme fatigue   Complete by:  As directed    Call MD for:  persistant dizziness or light-headedness   Complete by:  As directed    Call MD for:  severe uncontrolled pain   Complete by:  As directed    Diet - low sodium heart healthy   Complete by:  As directed    Diet Carb Modified   Complete by:  As directed    Discharge instructions   Complete by:  As directed    Acetaminophen dose from all sources not to exceed 3 g/day.   Increase activity slowly   Complete by:  As directed        Medication List    TAKE these medications   acetaminophen 325 MG tablet Commonly known as:  TYLENOL Take 2 tablets (650 mg total) by mouth every 6 (six) hours  as needed for mild pain.   allopurinol 100 MG tablet Commonly known as:  ZYLOPRIM Take 2 tablets (200 mg total) by mouth daily. What changed:    medication strength  how much to take   amLODipine 10 MG tablet Commonly known as:  NORVASC Take 10 mg by mouth daily.   aspirin EC 81 MG tablet Take 81 mg by mouth daily.   BIOTIN FORTE PO Take 1 tablet by mouth daily.   bisacodyl 10 MG suppository Commonly known as:  DULCOLAX Place 1 suppository (10 mg total) rectally daily as needed for moderate constipation.   cetirizine 10 MG tablet Commonly known as:  ZYRTEC Take 10 mg by mouth at bedtime.    fluticasone 50 MCG/ACT nasal spray Commonly known as:  FLONASE Place 2 sprays into both nostrils daily as needed for allergies or rhinitis.   folic acid 1 MG tablet Commonly known as:  FOLVITE Take 1 mg by mouth daily.   furosemide 80 MG tablet Commonly known as:  LASIX Take 80 mg by mouth 2 (two) times daily.   hydrALAZINE 50 MG tablet Commonly known as:  APRESOLINE Take 50 mg by mouth 3 (three) times daily.   insulin aspart 100 UNIT/ML injection Commonly known as:  novoLOG Inject 0-15 Units into the skin 3 (three) times daily with meals. CBG < 70: implement hypoglycemia protocol CBG 70 - 120: 0 units CBG 121 - 150: 2 units CBG 151 - 200: 3 units CBG 201 - 250: 5 units CBG 251 - 300: 8 units CBG 301 - 350: 11 units CBG 351 - 400: 15 units CBG > 400: call MD.   insulin aspart 100 UNIT/ML injection Commonly known as:  novoLOG Inject 0-5 Units into the skin at bedtime. Correction coverage: HS scale CBG < 70: implement hypoglycemia protocol CBG 70 - 120: 0 units CBG 121 - 150: 0 units CBG 151 - 200: 0 units CBG 201 - 250: 2 units CBG 251 - 300: 3 units CBG 301 - 350: 4 units CBG 351 - 400: 5 units CBG > 400: call MD.   insulin glargine 100 UNIT/ML injection Commonly known as:  LANTUS Inject 0.05 mLs (5 Units total) into the skin daily. Start taking on:  05/06/2018   levothyroxine 75 MCG tablet Commonly known as:  SYNTHROID, LEVOTHROID Take 75 mcg by mouth daily before breakfast.   methylPREDNISolone 4 MG Tbpk tablet Commonly known as:  [place] Patient is currently on day 3 of 5 days (started 05/03/2018).  Complete 5 days on 05/07/2018 as per Dosepak instructions.   MULTI-VITAMIN PO Take 1 tablet by mouth daily.   oxyCODONE-acetaminophen 5-325 MG tablet Commonly known as:  PERCOCET/ROXICET Take 1 tablet by mouth every 12 (twelve) hours as needed for moderate pain.   polyethylene glycol packet Commonly known as:  MIRALAX / GLYCOLAX Take 17 g by mouth 2  (two) times daily.   ranitidine 150 MG tablet Commonly known as:  ZANTAC Take 150 mg by mouth daily.   senna 8.6 MG Tabs tablet Commonly known as:  SENOKOT Take 1 tablet (8.6 mg total) by mouth daily. Start taking on:  05/06/2018   simvastatin 40 MG tablet Commonly known as:  ZOCOR Take 40 mg by mouth daily.   VITAMIN B-12 PO Take by mouth daily.       Contact information for follow-up providers    Schedule an appointment as soon as possible for a visit with Adrian Prows, MD.   Specialty:  Cardiology Contact information:  [address] [place] [address] [phone]        [name], MD. Schedule an appointment as soon as possible for a visit.   Specialty:  Internal Medicine Why:  Upon discharge from SNF Contact information: [place] [address] [phone]        MD at SNF. Schedule an appointment as soon as possible for a visit in 3 day(s).   Why:  To be seen with repeat labs (CBC & renal panel)       Estanislado Emms, MD. Schedule an appointment as soon as possible for a visit in 1 week(s).   Specialty:  Nephrology Contact information: [place] [address] [phone]            Contact information for after-discharge care    Destination    [facility] .   Service:  Skilled Nursing Contact information: [facility] [place] [phone]                 Allergies  Allergen Reactions  . Codeine Other (See Comments)    Increases Pain and couldn't sleep      Procedures/Studies: Ct Lumbar Spine Wo Contrast  Result Date: 04/29/2018 CLINICAL DATA:  Right hip pain.  Back pain. EXAM: CT LUMBAR SPINE WITHOUT CONTRAST TECHNIQUE: Multidetector CT imaging of the lumbar spine was performed without intravenous contrast administration. Multiplanar CT image reconstructions were also generated. COMPARISON:  None.  FINDINGS: Segmentation: 5 lumbar type vertebrae. Alignment: Normal. Vertebrae: No acute fracture or focal pathologic process. Paraspinal and other soft tissues: Negative. Other: Mild osteoarthritis of bilateral sacroiliac joints. Abdominal aortic atherosclerosis. Disc levels: Mild degenerative disc disease with disc height loss at T11-12 and to lesser extent L1-2, L4-5. T12-L1: Mild broad-based disc bulge. Mild bilateral facet arthropathy. No foraminal stenosis. L1-L2: Broad-based disc bulge. Mild bilateral facet arthropathy. No foraminal stenosis. L2-L3: Broad-based disc bulge with a small left foraminal disc protrusion contacting the left L2 nerve root. Mild bilateral facet arthropathy. No foraminal stenosis. L3-L4: Broad-based disc bulge eccentric towards the left. Moderate bilateral facet arthropathy. Mild spinal stenosis. No foraminal stenosis. L4-L5: Broad-based disc bulge. Moderate-severe bilateral facet arthropathy. Mild spinal stenosis. No significant foraminal stenosis. L5-S1: Small right paracentral disc protrusion. No foraminal stenosis. Mild bilateral facet arthropathy. IMPRESSION: 1.  No acute osseous injury of the lumbar spine. 2. Diffuse lumbar spine spondylosis as described above. Electronically Signed   By: Kathreen Devoid   On: 04/29/2018 17:57   Ct Pelvis Wo Contrast  Result Date: 04/29/2018 CLINICAL DATA:  Pain post fall EXAM: CT PELVIS WITHOUT CONTRAST TECHNIQUE: Multidetector CT imaging of the pelvis was performed following the standard protocol without intravenous contrast. COMPARISON:  None. FINDINGS: Urinary Tract:  No abnormality visualized. Bowel:  Unremarkable visualized pelvic bowel loops. Vascular/Lymphatic: Patchy iliofemoral arterial calcifications. No adenopathy. Reproductive:  No mass or other significant abnormality Other: No ascites.  No free air. Musculoskeletal: Degenerative disc disease L4-5. L5 is a transitional segment with left-sided assimilation joint with the pelvis.  Negative for fracture or dislocation. Regional soft tissues unremarkable. IMPRESSION: 1. Negative for fracture, dislocation, or other acute finding. 2. Degenerative disc disease L4-5. Electronically Signed   By: Lucrezia Europe M.D.   On: 04/29/2018 17:53      Subjective: Patient reports  that she feels much better compared to admission.  Reports 0 pain if she lies still.  With activity, pain is >50% better compared to prior.  Reports having a good BM after Dulcolax suppository last night.  No tingling or numbness or weakness in lower extremities.  Denies dyspnea or chest pain.  As per RN, no acute issues reported.  Discharge Exam:  Vitals:   05/04/18 1340 05/04/18 2043 05/05/18 0533 05/05/18 0930  BP: (!) 151/80 133/74 (!) 143/81   Pulse: 68 75 73   Resp: 16  18   Temp: 98.1 F (36.7 C) 98.5 F (36.9 C) 98.3 F (36.8 C)   TempSrc: Oral Oral Oral   SpO2: 100% 99% 99% 99%  Weight:      Height:        General exam: Pleasant elderly female, moderately built and obese, lying comfortably supine in bed. Respiratory system: Clear to auscultation. Respiratory effort normal.  Cardiovascular system: S1 & S2 heard, RRR. No JVD, murmurs, rubs, gallops or clicks. No pedal edema.  Telemetry personally reviewed: V paced rhythm. Gastrointestinal system: Abdomen is nondistended, soft and nontender. No organomegaly or masses felt. Normal bowel sounds heard.  Central nervous system: Alert and oriented x3. No focal neurological deficits.   Extremities: Symmetric 5 x 5 power in upper extremities.  Able to move her lower extremities much better (assessment was limited in the past due to pain which is improving).  Grade 4+ by 5 power at least in lower extremities. Skin: No rashes, lesions or ulcers Psychiatry: Judgement and insight appear normal. Mood & affect appropriate. Musculoskeletal: Mild right lower lumbar paraspinal muscle tenderness/spasm without deformities.  Improving.      The results of  significant diagnostics from this hospitalization (including imaging, microbiology, ancillary and laboratory) are listed below for reference.     Microbiology: Recent Results (from the past 240 hour(s))  Urine Culture     Status: Abnormal   Collection Time: 04/30/18  3:17 AM  Result Value Ref Range Status   Specimen Description URINE, RANDOM  Final   Special Requests   Final    NONE Performed at [facility], [address] [place], [place] [address]    Culture >=100,000 COLONIES/mL ESCHERICHIA COLI (A)  Final   Report Status 05/02/2018 FINAL  Final   Organism ID, Bacteria ESCHERICHIA COLI (A)  Final      Susceptibility   Escherichia coli - MIC*    AMPICILLIN >=32 RESISTANT Resistant     CEFAZOLIN <=4 SENSITIVE Sensitive     CEFTRIAXONE <=1 SENSITIVE Sensitive     CIPROFLOXACIN <=0.25 SENSITIVE Sensitive     GENTAMICIN <=1 SENSITIVE Sensitive     IMIPENEM <=0.25 SENSITIVE Sensitive     NITROFURANTOIN <=16 SENSITIVE Sensitive     TRIMETH/SULFA >=320 RESISTANT Resistant     AMPICILLIN/SULBACTAM >=32 RESISTANT Resistant     PIP/TAZO <=4 SENSITIVE Sensitive     Extended ESBL NEGATIVE Sensitive     * >=100,000 COLONIES/mL ESCHERICHIA COLI  MRSA PCR Screening     Status: None   Collection Time: 04/30/18  3:35 AM  Result Value Ref Range Status   MRSA by PCR NEGATIVE NEGATIVE Final    Comment:        The GeneXpert MRSA Assay (FDA approved for NASAL specimens only), is one component of a comprehensive MRSA colonization surveillance program. It is not intended to diagnose MRSA infection nor to guide or monitor treatment for MRSA infections. Performed at [facility],  [address], [place], [place] [address]   Culture, blood (Routine X 2) w Reflex to ID Panel     Status: None (Preliminary result)   Collection Time: 04/30/18  4:55 AM  Result Value Ref Range Status   Specimen Description BLOOD RIGHT ANTECUBITAL  Final   Special Requests   Final    BOTTLES DRAWN  AEROBIC AND ANAEROBIC Blood Culture adequate volume   Culture   Final    NO GROWTH 4 DAYS Performed at [facility], [place] [address], [place], [place] [address]    Report Status PENDING  Incomplete  Culture, blood (Routine X 2) w Reflex to ID Panel     Status: None (Preliminary result)   Collection Time: 04/30/18  6:33 AM  Result Value Ref Range Status   Specimen Description BLOOD RIGHT HAND  Final   Special Requests   Final    BOTTLES DRAWN AEROBIC ONLY Blood Culture adequate volume   Culture   Final    NO GROWTH 4 DAYS Performed at [facility], [place] [address] [place],  [address]    Report Status PENDING  Incomplete     Labs: CBC: Recent Labs  Lab 04/29/18 1622  05/01/18 0759 05/01/18 2130 05/02/18 0531 05/04/18 0703 05/05/18 0329  WBC 13.0*   < > 25.8* 18.0* 18.9* 14.7* 20.9*  NEUTROABS 11.6*  --   --   --  17.4* 13.1*  --   HGB 6.7*   < > 7.5* 8.3* 8.2* 8.2* 8.3*  HCT 20.0*   < > 22.5* 24.5* 24.3* 24.1* 24.5*  MCV 89.7   < > 87.9 87.2 88.4 87.3 88.1  PLT 122*   < > 93* 98* 106* 177 199   < > = values in this interval not displayed.   Basic Metabolic Panel: Recent Labs  Lab 04/29/18 1942  05/01/18 2130 05/02/18 0531 05/03/18 0649 05/04/18 0703 05/05/18 0329  NA  --    < > 134* 132* 132* 130* 128*  K  --    < > 4.0 4.3 5.0 5.0 4.9  CL  --    < > 94* 93* 95* 92* 93*  CO2  --    < > 22 24 21* 22 23  GLUCOSE  --    < > 147* 158* 208* 317* 265*  BUN  --    < > 94* 95* 96* 106* 116*  CREATININE  --    < > 5.32* 5.24* 4.26* 3.92* 3.77*  CALCIUM  --    < > 8.5* 8.5* 8.9 9.3 9.5  MG 2.0  --   --   --   --   --   --    < > = values in this interval not displayed.    Cardiac Enzymes: Recent Labs  Lab 04/29/18 1622  CKTOTAL 81   CBG: Recent Labs  Lab 05/04/18 1203 05/04/18 1534 05/04/18 1657 05/04/18 2233 05/05/18 0756  GLUCAP 270* 258* 318* 265* 240*   Urinalysis    Component Value Date/Time   COLORURINE YELLOW 04/29/2018 1953    APPEARANCEUR CLEAR 04/29/2018 1953   LABSPEC 1.010 04/29/2018 1953   PHURINE 5.0 04/29/2018 1953   GLUCOSEU NEGATIVE 04/29/2018 1953   HGBUR SMALL (A) 04/29/2018 1953   BILIRUBINUR NEGATIVE 04/29/2018 [place] NEGATIVE 04/29/2018 1953   PROTEINUR 100 (A) 04/29/2018 1953   NITRITE NEGATIVE 04/29/2018 1953   LEUKOCYTESUR MODERATE (A) 04/29/2018 1953      Time coordinating discharge:  40 minutes  SIGNED:  Vernell Leep, MD, FACP, [facility]. [facility] Pager [phone]  If 7PM-7AM, please contact night-coverage [email or web address] Password [facility] 05/05/2018, 12:39 PM

## 2018-05-05 NOTE — Progress Notes (Signed)
Marcina Millard Whiteside to be D/C'd to [facility] per MD order. Daughter-in-law, Anderson Malta notified. Report called to Lauralyn Primes, Therapist, sports. VVS, Skin clean, dry and intact without evidence of skin break down, no evidence of skin tears noted.  IV catheter discontinued intact. Site without signs and symptoms of complications. Dressing and pressure applied.  An After Visit Summary was printed and given to the patient.  Patient escorted via stretcher, and D/C to [facility]. Markus Jarvis 05/05/2018 3:35 PM

## 2018-05-05 NOTE — Care Management Important Message (Signed)
Important Message  Patient Details  Name: Erika Fry MRN: [id number] Date of Birth: October 27, 1946   Medicare Important Message Given:  Yes    [name] 05/05/2018, 3:36 PM

## 2018-05-05 NOTE — Discharge Instructions (Signed)

## 2018-05-06 ENCOUNTER — Non-Acute Institutional Stay (SKILLED_NURSING_FACILITY): Payer: Medicare Other | Admitting: Internal Medicine

## 2018-05-06 ENCOUNTER — Encounter: Payer: Self-pay | Admitting: Internal Medicine

## 2018-05-06 DIAGNOSIS — E785 Hyperlipidemia, unspecified: Secondary | ICD-10-CM | POA: Diagnosis not present

## 2018-05-06 DIAGNOSIS — I10 Essential (primary) hypertension: Secondary | ICD-10-CM | POA: Diagnosis not present

## 2018-05-06 DIAGNOSIS — E119 Type 2 diabetes mellitus without complications: Secondary | ICD-10-CM

## 2018-05-06 DIAGNOSIS — N189 Chronic kidney disease, unspecified: Secondary | ICD-10-CM | POA: Diagnosis not present

## 2018-05-06 DIAGNOSIS — J9601 Acute respiratory failure with hypoxia: Secondary | ICD-10-CM

## 2018-05-06 DIAGNOSIS — M5441 Lumbago with sciatica, right side: Secondary | ICD-10-CM | POA: Diagnosis not present

## 2018-05-06 DIAGNOSIS — K219 Gastro-esophageal reflux disease without esophagitis: Secondary | ICD-10-CM | POA: Diagnosis not present

## 2018-05-06 DIAGNOSIS — B9620 Unspecified Escherichia coli [E. coli] as the cause of diseases classified elsewhere: Secondary | ICD-10-CM

## 2018-05-06 DIAGNOSIS — E034 Atrophy of thyroid (acquired): Secondary | ICD-10-CM | POA: Diagnosis not present

## 2018-05-06 DIAGNOSIS — N390 Urinary tract infection, site not specified: Secondary | ICD-10-CM | POA: Diagnosis not present

## 2018-05-06 DIAGNOSIS — N179 Acute kidney failure, unspecified: Secondary | ICD-10-CM

## 2018-05-06 DIAGNOSIS — E1169 Type 2 diabetes mellitus with other specified complication: Secondary | ICD-10-CM

## 2018-05-06 NOTE — Progress Notes (Signed)
:   Provider:  Hennie Duos, MD Location:  [facility] Room Number: 323-P Place of Service:  SNF (31)  PCP: [name], MD Patient Care Team: [name], MD as PCP - General (Internal Medicine)  Extended Emergency Contact Information Primary Emergency Contact: Rogus,Jennifer  [place] [employer] Phone: [phone] Relation: Relative Secondary Emergency Contact: Lyter, John Relation: Son     Allergies: Codeine  Chief Complaint  Patient presents with  . New Admit To SNF    Admit to [facility]    HPI: Patient is 72 y.o. female with hypertension, hyperlipidemia, diabetes mellitus diet-controlled, GERD, hypothyroidism, gout, chronic kidney disease stage IV, iron deficiency anemia, second-degree AV block status post pacemaker placement, aortic stenosis, who presented to [facility] ED after slipping out of her recliner.  She developed pain in her right lower back, laid on the floor for 3 hours.  Leg pain radiated to her right leg.  She did not have numbness or weakness or loss of bowel or bladder.  Pain was aggravated by movement.  Patient had no chest pain shortness of breath cough fever chills nausea vomiting diarrhea abdominal pain, symptoms of UTI or unilateral weakness.  She had a pacemaker placed 4 weeks prior.  In ED patient was found to have a WBC of 13, hemoglobin 6.7 on first CBC but 8.5 on repeat CBC, negative FOBT, potassium 3.2 renal function at baseline, oxygen saturation 95%.  CT of lumbar spine and pelvis negative for acute bony fracture.  Patient was admitted to [facility] from 5/29-6/4 where she was treated for acute right low back pain with sciatica with heat, opioids, muscle relaxers and lidocaine patch.  An MRI was unable to be done secondary to permanent pacemaker.  Patient was started on Medrol Dosepak with some improvement.  Neurosurgery was consulted who agreed with current treatment.  Hospital  course was complicated by suspected E. coli UTI which was treated 3 days of IV Rocephin.  Also patient had acute on chronic stage IV kidney disease with a creatinine high 5.4 and a creatinine of 3.7 after IV hydration.  As per nephrology the baseline creatinine appears to be between 3.5 and 4 at baseline.  Throughout patient had acute respiratory failure with hypoxia likely related to sedation from medications which resolved.  Patient is admitted to skilled nursing facility for OT/PT.  While at skilled nursing facility patient will be followed for hypertension treated with Norvasc and hydralazine, hyperlipidemia treated with Zocor and hypothyroidism treated with replacement.  Past Medical History:  Diagnosis Date  . Arthritis    "knees, thumbs" (03/25/2018)  . CKD (chronic kidney disease), stage IV ([place])   . Diet-controlled diabetes mellitus ([place])   . GERD (gastroesophageal reflux disease)   . Gout    "on daily RX" (03/25/2018)  . Heart murmur   . High cholesterol   . Hypertension   . Hypothyroidism   . Iron deficiency anemia    "had to get an iron infusion"  . Migraine    "used to have them growing up" (03/25/2018)  . Presence of permanent cardiac pacemaker 03/25/2018    Past Surgical History:  Procedure Laterality Date  . APPENDECTOMY    . INSERT / REPLACE / REMOVE PACEMAKER  03/25/2018  . KNEE ARTHROSCOPY Bilateral   . PACEMAKER IMPLANT N/A 03/25/2018   Procedure: PACEMAKER IMPLANT;  Surgeon: Evans Lance, MD;  Location: [facility];  Service: Cardiovascular;  Laterality: N/A;  .  PACEMAKER PLACEMENT Left 03/2018  . RIGHT HEART CATH N/A 03/20/2018   Procedure: RIGHT HEART CATH;  Surgeon: Nigel Mormon, MD;  Location: [facility];  Service: Cardiovascular;  Laterality: N/A;  . TEE WITHOUT CARDIOVERSION N/A 03/20/2018   Procedure: TRANSESOPHAGEAL ECHOCARDIOGRAM (TEE);  Surgeon: Nigel Mormon, MD;  Location: [facility] ENDOSCOPY;  Service: Cardiovascular;  Laterality:  N/A;  . TONSILLECTOMY      Allergies as of 05/06/2018      Reactions   Codeine Other (See Comments)   Increases Pain and couldn't sleep      Medication List        Accurate as of 05/06/18  9:10 AM. Always use your most recent med list.          acetaminophen 325 MG tablet Commonly known as:  TYLENOL Take 2 tablets (650 mg total) by mouth every 6 (six) hours as needed for mild pain.   allopurinol 100 MG tablet Commonly known as:  ZYLOPRIM Take 2 tablets (200 mg total) by mouth daily.   amLODipine 10 MG tablet Commonly known as:  NORVASC Take 10 mg by mouth daily.   aspirin EC 81 MG tablet Take 81 mg by mouth daily.   BIOTIN FORTE PO Take 1 tablet by mouth daily.   bisacodyl 10 MG suppository Commonly known as:  DULCOLAX Place 1 suppository (10 mg total) rectally daily as needed for moderate constipation.   cetirizine 10 MG tablet Commonly known as:  ZYRTEC Take 10 mg by mouth at bedtime.   fluticasone 50 MCG/ACT nasal spray Commonly known as:  FLONASE Place 2 sprays into both nostrils daily as needed for allergies or rhinitis.   folic acid 1 MG tablet Commonly known as:  FOLVITE Take 1 mg by mouth daily.   furosemide 80 MG tablet Commonly known as:  LASIX Take 80 mg by mouth 2 (two) times daily.   hydrALAZINE 50 MG tablet Commonly known as:  APRESOLINE Take 50 mg by mouth 3 (three) times daily.   insulin aspart 100 UNIT/ML injection Commonly known as:  novoLOG Inject 0-15 Units into the skin 3 (three) times daily with meals. CBG < 70: implement hypoglycemia protocol CBG 70 - 120: 0 units CBG 121 - 150: 2 units CBG 151 - 200: 3 units CBG 201 - 250: 5 units CBG 251 - 300: 8 units CBG 301 - 350: 11 units CBG 351 - 400: 15 units CBG > 400: call MD.   insulin aspart 100 UNIT/ML injection Commonly known as:  novoLOG Inject 0-5 Units into the skin at bedtime. Correction coverage: HS scale CBG < 70: implement hypoglycemia protocol CBG 70 - 120: 0  units CBG 121 - 150: 0 units CBG 151 - 200: 0 units CBG 201 - 250: 2 units CBG 251 - 300: 3 units CBG 301 - 350: 4 units CBG 351 - 400: 5 units CBG > 400: call MD.   insulin glargine 100 UNIT/ML injection Commonly known as:  LANTUS Inject 0.05 mLs (5 Units total) into the skin daily.   levothyroxine 75 MCG tablet Commonly known as:  SYNTHROID, LEVOTHROID Take 75 mcg by mouth daily before breakfast.   methylPREDNISolone 4 MG Tbpk tablet Commonly known as:  [place] Patient is currently on day 3 of 5 days (started 05/03/2018).  Complete 5 days on 05/07/2018 as per Dosepak instructions.   MULTI-VITAMIN PO Take 1 tablet by mouth daily.   oxyCODONE-acetaminophen 5-325 MG tablet Commonly known as:  PERCOCET/ROXICET Take 1 tablet  by mouth every 12 (twelve) hours as needed for moderate pain.   polyethylene glycol packet Commonly known as:  MIRALAX / GLYCOLAX Take 17 g by mouth 2 (two) times daily.   ranitidine 150 MG tablet Commonly known as:  ZANTAC Take 150 mg by mouth daily.   senna 8.6 MG Tabs tablet Commonly known as:  SENOKOT Take 1 tablet (8.6 mg total) by mouth daily.   simvastatin 40 MG tablet Commonly known as:  ZOCOR Take 40 mg by mouth daily.   VITAMIN B-12 PO Take by mouth daily.       No orders of the defined types were placed in this encounter.   Immunization History  Administered Date(s) Administered  . Pneumococcal Polysaccharide-23 05/01/2018    Social History   Tobacco Use  . Smoking status: Never Smoker  . Smokeless tobacco: Never Used  Substance Use Topics  . Alcohol use: Not Currently    Family history is   Family History  Problem Relation Age of Onset  . Hypertension Mother   . Diabetes Mellitus II Father   . Heart disease Father   . Gastric cancer Brother   . Diabetes Mellitus II Brother       Review of Systems  DATA OBTAINED: from patient, nurse GENERAL:  no fevers, fatigue, appetite changes SKIN: No itching, or  rash EYES: No eye pain, redness, discharge EARS: No earache, tinnitus, change in hearing NOSE: No congestion, drainage or bleeding  MOUTH/THROAT: No mouth or tooth pain, No sore throat RESPIRATORY: No cough, wheezing, SOB CARDIAC: No chest pain, palpitations, lower extremity edema  GI: No abdominal pain, No N/V/D or constipation, No heartburn or reflux  GU: No dysuria, frequency or urgency, or incontinence  MUSCULOSKELETAL: No unrelieved bone/joint pain NEUROLOGIC: No headache, dizziness or focal weakness PSYCHIATRIC: No c/o anxiety or sadness   Vitals:   05/06/18 0856  BP: (!) 149/84  Pulse: 77  Resp: 20  Temp: (!) 97.4 F (36.3 C)    SpO2 Readings from Last 1 Encounters:  05/05/18 97%   Body mass index is 33.28 kg/m.     Physical Exam  GENERAL APPEARANCE: Alert, conversant,  No acute distress.  SKIN: No diaphoresis rash HEAD: Normocephalic, atraumatic  EYES: Conjunctiva/lids clear. Pupils round, reactive. EOMs intact.  EARS: External exam WNL, canals clear. Hearing grossly normal.  NOSE: No deformity or discharge.  MOUTH/THROAT: Lips w/o lesions  RESPIRATORY: Breathing is even, unlabored. Lung sounds are clear   CARDIOVASCULAR: Heart RRR no murmurs, rubs or gallops. No peripheral edema.   GASTROINTESTINAL: Abdomen is soft, non-tender, not distended w/ normal bowel sounds. GENITOURINARY: Bladder non tender, not distended  MUSCULOSKELETAL: No abnormal joints or musculature NEUROLOGIC:  Cranial nerves 2-12 grossly intact. Moves all extremities  PSYCHIATRIC: Mood and affect appropriate to situation, no behavioral issues  Patient Active Problem List   Diagnosis Date Noted  . Acute low back pain with sciatica 05/02/2018  . Acute kidney injury superimposed on chronic kidney disease ([place])   . Toxic metabolic encephalopathy   . UTI (urinary tract infection) 04/30/2018  . Essential hypertension 04/29/2018  . Fall 04/29/2018  . Back pain 04/29/2018  . Leukocytosis  04/29/2018  . Hypokalemia 04/29/2018  . CKD (chronic kidney disease), stage IV ([place])   . Diet-controlled diabetes mellitus ([place])   . GERD (gastroesophageal reflux disease)   . Gout   . High cholesterol   . Hypothyroidism   . Iron deficiency anemia   . AV block, Mobitz 2 03/25/2018  .  Mobitz type 2 second degree AV block 03/20/2018  . Aortic stenosis 03/20/2018  . Exertional dyspnea 03/19/2018      Labs reviewed: Basic Metabolic Panel:    Component Value Date/Time   NA 128 (L) 05/05/2018 0329   K 4.9 05/05/2018 0329   CL 93 (L) 05/05/2018 0329   CO2 23 05/05/2018 0329   GLUCOSE 265 (H) 05/05/2018 0329   BUN 116 (H) 05/05/2018 0329   CREATININE 3.77 (H) 05/05/2018 0329   CALCIUM 9.5 05/05/2018 0329   CALCIUM 9.6 10/31/2017 1050   ALBUMIN 3.5 10/31/2017 1050   GFRNONAA 11 (L) 05/05/2018 0329   GFRAA 13 (L) 05/05/2018 0329    Recent Labs    10/31/17 1050  04/29/18 1942  05/03/18 0649 05/04/18 0703 05/05/18 0329  NA 136   < >  --    < > 132* 130* 128*  K 3.7   < >  --    < > 5.0 5.0 4.9  CL 99*   < >  --    < > 95* 92* 93*  CO2 27   < >  --    < > 21* 22 23  GLUCOSE 182*   < >  --    < > 208* 317* 265*  BUN 49*   < >  --    < > 96* 106* 116*  CREATININE 3.05*   < >  --    < > 4.26* 3.92* 3.77*  CALCIUM 9.4  9.6   < >  --    < > 8.9 9.3 9.5  MG  --   --  2.0  --   --   --   --   PHOS 4.2  --   --   --   --   --   --    < > = values in this interval not displayed.   Liver Function Tests: Recent Labs    10/31/17 1050  ALBUMIN 3.5   No results for input(s): LIPASE, AMYLASE in the last 8760 hours. No results for input(s): AMMONIA in the last 8760 hours. CBC: Recent Labs    04/29/18 1622  05/02/18 0531 05/04/18 0703 05/05/18 0329  WBC 13.0*   < > 18.9* 14.7* 20.9*  NEUTROABS 11.6*  --  17.4* 13.1*  --   HGB 6.7*   < > 8.2* 8.2* 8.3*  HCT 20.0*   < > 24.3* 24.1* 24.5*  MCV 89.7   < > 88.4 87.3 88.1  PLT 122*   < > 106* 177 199   < > = values in this  interval not displayed.   Lipid No results for input(s): CHOL, HDL, LDLCALC, TRIG in the last 8760 hours.  Cardiac Enzymes: Recent Labs    04/29/18 1622  CKTOTAL 81   BNP: No results for input(s): BNP in the last 8760 hours. No results found for: [facility] Lab Results  Component Value Date   HGBA1C 6.3 (H) 04/30/2018   No results found for: TSH Lab Results  Component Value Date   VITAMINB12 >7,500 (H) 04/29/2018   Lab Results  Component Value Date   FOLATE 69.0 04/29/2018   Lab Results  Component Value Date   IRON 16 (L) 04/29/2018   TIBC 235 (L) 04/29/2018   FERRITIN 204 04/29/2018    Imaging and Procedures obtained prior to SNF admission: Ct Lumbar Spine Wo Contrast  Result Date: 04/29/2018 CLINICAL DATA:  Right hip pain.  Back pain. EXAM: CT LUMBAR SPINE WITHOUT  CONTRAST TECHNIQUE: Multidetector CT imaging of the lumbar spine was performed without intravenous contrast administration. Multiplanar CT image reconstructions were also generated. COMPARISON:  None. FINDINGS: Segmentation: 5 lumbar type vertebrae. Alignment: Normal. Vertebrae: No acute fracture or focal pathologic process. Paraspinal and other soft tissues: Negative. Other: Mild osteoarthritis of bilateral sacroiliac joints. Abdominal aortic atherosclerosis. Disc levels: Mild degenerative disc disease with disc height loss at T11-12 and to lesser extent L1-2, L4-5. T12-L1: Mild broad-based disc bulge. Mild bilateral facet arthropathy. No foraminal stenosis. L1-L2: Broad-based disc bulge. Mild bilateral facet arthropathy. No foraminal stenosis. L2-L3: Broad-based disc bulge with a small left foraminal disc protrusion contacting the left L2 nerve root. Mild bilateral facet arthropathy. No foraminal stenosis. L3-L4: Broad-based disc bulge eccentric towards the left. Moderate bilateral facet arthropathy. Mild spinal stenosis. No foraminal stenosis. L4-L5: Broad-based disc bulge. Moderate-severe bilateral facet  arthropathy. Mild spinal stenosis. No significant foraminal stenosis. L5-S1: Small right paracentral disc protrusion. No foraminal stenosis. Mild bilateral facet arthropathy. IMPRESSION: 1.  No acute osseous injury of the lumbar spine. 2. Diffuse lumbar spine spondylosis as described above. Electronically Signed   By: Kathreen Devoid   On: 04/29/2018 17:57   Ct Pelvis Wo Contrast  Result Date: 04/29/2018 CLINICAL DATA:  Pain post fall EXAM: CT PELVIS WITHOUT CONTRAST TECHNIQUE: Multidetector CT imaging of the pelvis was performed following the standard protocol without intravenous contrast. COMPARISON:  None. FINDINGS: Urinary Tract:  No abnormality visualized. Bowel:  Unremarkable visualized pelvic bowel loops. Vascular/Lymphatic: Patchy iliofemoral arterial calcifications. No adenopathy. Reproductive:  No mass or other significant abnormality Other: No ascites.  No free air. Musculoskeletal: Degenerative disc disease L4-5. L5 is a transitional segment with left-sided assimilation joint with the pelvis. Negative for fracture or dislocation. Regional soft tissues unremarkable. IMPRESSION: 1. Negative for fracture, dislocation, or other acute finding. 2. Degenerative disc disease L4-5. Electronically Signed   By: Lucrezia Europe M.D.   On: 04/29/2018 17:53     Not all labs, radiology exams or other studies done during hospitalization come through on my EPIC note; however they are reviewed by me.    Assessment and Plan  Acute right low back pain with sciatica-sustained after mechanical fall from recliner at home.  CT lumbar spine and pelvis without acute findings and no neurological deficits on exam.  Treated supportively with heat pack, pain management with opioids muscle relaxants, lidocaine patch which was unsuccessful until patient was started on a Medrol Dosepak.  Neurosurgery agreed with conservative treatment SNF -admitted for OT/PT.;  Finished Medrol Dosepak continue Percocet increased to every 6 hours  and start Robaxin which was not included on the discharge summary 500 mg every 6  Suspected E. coli cystitis-patient was treated with 3 days of IV Rocephin  Acute on chronic kidney disease stage IV- presentation creatinine 3.8 which increased to 5.4, possibly related to poor oral intake or soft blood pressures.  Not on ACE or arm or NSAIDs.  Antihypertensives and Lasix were held and IV fluids were given and creatinine improved to 3.7.  Per neurology baseline creatinine is 3.5 to 4.0 SNF -follow-up BMP  Diabetes mellitus type 2-diet controlled with hemoglobin A1c of 6.3; started on Lantus and sliding scale insulin when placed on steroids; SNF -expect all insulin to be stopped when steroid tapers over  Acute respiratory failure with hypoxia/acute toxic metabolic encephalopathy-likely related to medications-improved and saturating 97% on room air  Hypertension SNF -controlled: Continue Norvasc 10 mg daily, Lasix 80 mg twice a day and hydralazine 50  mg 3 times daily  Hyperlipidemia SNF -not stated as uncontrolled; continue Zocor 40 mg daily  Hypothyroidism SNF -not stated as uncontrolled; continue levothyroxine 75 mcg daily  GERD SNF -continue Zantac 150 mg twice daily    Time spent greater than 45 minutes;> 50% of time with patient was spent reviewing records, labs, tests and studies, counseling and developing plan of care  Hennie Duos, MD

## 2018-05-10 ENCOUNTER — Encounter: Payer: Self-pay | Admitting: Internal Medicine

## 2018-05-10 DIAGNOSIS — N390 Urinary tract infection, site not specified: Secondary | ICD-10-CM

## 2018-05-10 DIAGNOSIS — B9620 Unspecified Escherichia coli [E. coli] as the cause of diseases classified elsewhere: Secondary | ICD-10-CM | POA: Insufficient documentation

## 2018-05-10 DIAGNOSIS — E1169 Type 2 diabetes mellitus with other specified complication: Secondary | ICD-10-CM | POA: Insufficient documentation

## 2018-05-10 DIAGNOSIS — E785 Hyperlipidemia, unspecified: Secondary | ICD-10-CM

## 2018-05-10 DIAGNOSIS — J9601 Acute respiratory failure with hypoxia: Secondary | ICD-10-CM | POA: Insufficient documentation

## 2018-05-12 ENCOUNTER — Encounter (HOSPITAL_COMMUNITY): Payer: Self-pay

## 2018-05-12 ENCOUNTER — Inpatient Hospital Stay (HOSPITAL_COMMUNITY)
Admission: EM | Admit: 2018-05-12 | Discharge: 2018-05-22 | DRG: 095 | Disposition: A | Discharge status: Rehabilitation Facility | Payer: Medicare Other | Attending: Internal Medicine | Admitting: Internal Medicine

## 2018-05-12 DIAGNOSIS — K921 Melena: Secondary | ICD-10-CM | POA: Diagnosis present

## 2018-05-12 DIAGNOSIS — R5381 Other malaise: Secondary | ICD-10-CM | POA: Diagnosis not present

## 2018-05-12 DIAGNOSIS — J189 Pneumonia, unspecified organism: Secondary | ICD-10-CM | POA: Diagnosis not present

## 2018-05-12 DIAGNOSIS — R410 Disorientation, unspecified: Secondary | ICD-10-CM | POA: Diagnosis not present

## 2018-05-12 DIAGNOSIS — E1169 Type 2 diabetes mellitus with other specified complication: Secondary | ICD-10-CM | POA: Diagnosis present

## 2018-05-12 DIAGNOSIS — Z950 Presence of cardiac pacemaker: Secondary | ICD-10-CM

## 2018-05-12 DIAGNOSIS — I132 Hypertensive heart and chronic kidney disease with heart failure and with stage 5 chronic kidney disease, or end stage renal disease: Secondary | ICD-10-CM | POA: Diagnosis present

## 2018-05-12 DIAGNOSIS — M5416 Radiculopathy, lumbar region: Secondary | ICD-10-CM | POA: Diagnosis not present

## 2018-05-12 DIAGNOSIS — R630 Anorexia: Secondary | ICD-10-CM | POA: Diagnosis present

## 2018-05-12 DIAGNOSIS — R195 Other fecal abnormalities: Secondary | ICD-10-CM

## 2018-05-12 DIAGNOSIS — R609 Edema, unspecified: Secondary | ICD-10-CM | POA: Diagnosis not present

## 2018-05-12 DIAGNOSIS — R05 Cough: Secondary | ICD-10-CM | POA: Diagnosis not present

## 2018-05-12 DIAGNOSIS — E039 Hypothyroidism, unspecified: Secondary | ICD-10-CM | POA: Diagnosis present

## 2018-05-12 DIAGNOSIS — E11319 Type 2 diabetes mellitus with unspecified diabetic retinopathy without macular edema: Secondary | ICD-10-CM | POA: Diagnosis present

## 2018-05-12 DIAGNOSIS — M4640 Discitis, unspecified, site unspecified: Secondary | ICD-10-CM | POA: Diagnosis not present

## 2018-05-12 DIAGNOSIS — K219 Gastro-esophageal reflux disease without esophagitis: Secondary | ICD-10-CM | POA: Diagnosis present

## 2018-05-12 DIAGNOSIS — R739 Hyperglycemia, unspecified: Secondary | ICD-10-CM | POA: Diagnosis not present

## 2018-05-12 DIAGNOSIS — I169 Hypertensive crisis, unspecified: Secondary | ICD-10-CM | POA: Diagnosis not present

## 2018-05-12 DIAGNOSIS — G061 Intraspinal abscess and granuloma: Principal | ICD-10-CM | POA: Diagnosis present

## 2018-05-12 DIAGNOSIS — D631 Anemia in chronic kidney disease: Secondary | ICD-10-CM | POA: Diagnosis present

## 2018-05-12 DIAGNOSIS — D62 Acute posthemorrhagic anemia: Secondary | ICD-10-CM | POA: Diagnosis present

## 2018-05-12 DIAGNOSIS — D696 Thrombocytopenia, unspecified: Secondary | ICD-10-CM | POA: Diagnosis present

## 2018-05-12 DIAGNOSIS — E1122 Type 2 diabetes mellitus with diabetic chronic kidney disease: Secondary | ICD-10-CM | POA: Diagnosis present

## 2018-05-12 DIAGNOSIS — G479 Sleep disorder, unspecified: Secondary | ICD-10-CM | POA: Diagnosis not present

## 2018-05-12 DIAGNOSIS — E46 Unspecified protein-calorie malnutrition: Secondary | ICD-10-CM | POA: Diagnosis not present

## 2018-05-12 DIAGNOSIS — I129 Hypertensive chronic kidney disease with stage 1 through stage 4 chronic kidney disease, or unspecified chronic kidney disease: Secondary | ICD-10-CM | POA: Diagnosis not present

## 2018-05-12 DIAGNOSIS — I700 Atherosclerosis of aorta: Secondary | ICD-10-CM | POA: Diagnosis present

## 2018-05-12 DIAGNOSIS — M5441 Lumbago with sciatica, right side: Secondary | ICD-10-CM

## 2018-05-12 DIAGNOSIS — E1110 Type 2 diabetes mellitus with ketoacidosis without coma: Secondary | ICD-10-CM | POA: Diagnosis not present

## 2018-05-12 DIAGNOSIS — R402413 Glasgow coma scale score 13-15, at hospital admission: Secondary | ICD-10-CM | POA: Diagnosis present

## 2018-05-12 DIAGNOSIS — E119 Type 2 diabetes mellitus without complications: Secondary | ICD-10-CM | POA: Diagnosis not present

## 2018-05-12 DIAGNOSIS — M545 Low back pain: Secondary | ICD-10-CM

## 2018-05-12 DIAGNOSIS — G8929 Other chronic pain: Secondary | ICD-10-CM | POA: Diagnosis present

## 2018-05-12 DIAGNOSIS — Z794 Long term (current) use of insulin: Secondary | ICD-10-CM

## 2018-05-12 DIAGNOSIS — E118 Type 2 diabetes mellitus with unspecified complications: Secondary | ICD-10-CM | POA: Diagnosis not present

## 2018-05-12 DIAGNOSIS — M109 Gout, unspecified: Secondary | ICD-10-CM | POA: Diagnosis present

## 2018-05-12 DIAGNOSIS — D638 Anemia in other chronic diseases classified elsewhere: Secondary | ICD-10-CM | POA: Diagnosis not present

## 2018-05-12 DIAGNOSIS — K5900 Constipation, unspecified: Secondary | ICD-10-CM | POA: Diagnosis not present

## 2018-05-12 DIAGNOSIS — M4620 Osteomyelitis of vertebra, site unspecified: Secondary | ICD-10-CM

## 2018-05-12 DIAGNOSIS — I509 Heart failure, unspecified: Secondary | ICD-10-CM | POA: Diagnosis present

## 2018-05-12 DIAGNOSIS — M4626 Osteomyelitis of vertebra, lumbar region: Secondary | ICD-10-CM | POA: Diagnosis present

## 2018-05-12 DIAGNOSIS — N184 Chronic kidney disease, stage 4 (severe): Secondary | ICD-10-CM | POA: Diagnosis not present

## 2018-05-12 DIAGNOSIS — M4646 Discitis, unspecified, lumbar region: Secondary | ICD-10-CM | POA: Diagnosis present

## 2018-05-12 DIAGNOSIS — E7800 Pure hypercholesterolemia, unspecified: Secondary | ICD-10-CM | POA: Diagnosis present

## 2018-05-12 DIAGNOSIS — R918 Other nonspecific abnormal finding of lung field: Secondary | ICD-10-CM | POA: Diagnosis not present

## 2018-05-12 DIAGNOSIS — M19042 Primary osteoarthritis, left hand: Secondary | ICD-10-CM | POA: Diagnosis present

## 2018-05-12 DIAGNOSIS — K922 Gastrointestinal hemorrhage, unspecified: Secondary | ICD-10-CM

## 2018-05-12 DIAGNOSIS — R601 Generalized edema: Secondary | ICD-10-CM | POA: Diagnosis not present

## 2018-05-12 DIAGNOSIS — R400 Somnolence: Secondary | ICD-10-CM | POA: Diagnosis not present

## 2018-05-12 DIAGNOSIS — R011 Cardiac murmur, unspecified: Secondary | ICD-10-CM | POA: Diagnosis not present

## 2018-05-12 DIAGNOSIS — M48061 Spinal stenosis, lumbar region without neurogenic claudication: Secondary | ICD-10-CM | POA: Diagnosis present

## 2018-05-12 DIAGNOSIS — I441 Atrioventricular block, second degree: Secondary | ICD-10-CM | POA: Diagnosis present

## 2018-05-12 DIAGNOSIS — M8610 Other acute osteomyelitis, unspecified site: Secondary | ICD-10-CM | POA: Diagnosis not present

## 2018-05-12 DIAGNOSIS — Z79899 Other long term (current) drug therapy: Secondary | ICD-10-CM

## 2018-05-12 DIAGNOSIS — R600 Localized edema: Secondary | ICD-10-CM | POA: Diagnosis not present

## 2018-05-12 DIAGNOSIS — M5410 Radiculopathy, site unspecified: Secondary | ICD-10-CM | POA: Diagnosis not present

## 2018-05-12 DIAGNOSIS — M47816 Spondylosis without myelopathy or radiculopathy, lumbar region: Secondary | ICD-10-CM | POA: Diagnosis present

## 2018-05-12 DIAGNOSIS — E1141 Type 2 diabetes mellitus with diabetic mononeuropathy: Secondary | ICD-10-CM | POA: Diagnosis present

## 2018-05-12 DIAGNOSIS — K651 Peritoneal abscess: Secondary | ICD-10-CM | POA: Diagnosis not present

## 2018-05-12 DIAGNOSIS — D500 Iron deficiency anemia secondary to blood loss (chronic): Secondary | ICD-10-CM | POA: Diagnosis not present

## 2018-05-12 DIAGNOSIS — K5641 Fecal impaction: Secondary | ICD-10-CM | POA: Diagnosis present

## 2018-05-12 DIAGNOSIS — E1165 Type 2 diabetes mellitus with hyperglycemia: Secondary | ICD-10-CM | POA: Diagnosis not present

## 2018-05-12 DIAGNOSIS — E861 Hypovolemia: Secondary | ICD-10-CM | POA: Diagnosis present

## 2018-05-12 DIAGNOSIS — T380X5A Adverse effect of glucocorticoids and synthetic analogues, initial encounter: Secondary | ICD-10-CM | POA: Diagnosis not present

## 2018-05-12 DIAGNOSIS — Z8249 Family history of ischemic heart disease and other diseases of the circulatory system: Secondary | ICD-10-CM | POA: Diagnosis not present

## 2018-05-12 DIAGNOSIS — R0602 Shortness of breath: Secondary | ICD-10-CM | POA: Diagnosis not present

## 2018-05-12 DIAGNOSIS — M170 Bilateral primary osteoarthritis of knee: Secondary | ICD-10-CM | POA: Diagnosis present

## 2018-05-12 DIAGNOSIS — Z833 Family history of diabetes mellitus: Secondary | ICD-10-CM | POA: Diagnosis not present

## 2018-05-12 DIAGNOSIS — D72829 Elevated white blood cell count, unspecified: Secondary | ICD-10-CM | POA: Diagnosis not present

## 2018-05-12 DIAGNOSIS — Z95828 Presence of other vascular implants and grafts: Secondary | ICD-10-CM | POA: Diagnosis not present

## 2018-05-12 DIAGNOSIS — Z6835 Body mass index (BMI) 35.0-35.9, adult: Secondary | ICD-10-CM

## 2018-05-12 DIAGNOSIS — R2689 Other abnormalities of gait and mobility: Secondary | ICD-10-CM | POA: Diagnosis not present

## 2018-05-12 DIAGNOSIS — R52 Pain, unspecified: Secondary | ICD-10-CM | POA: Diagnosis not present

## 2018-05-12 DIAGNOSIS — Z885 Allergy status to narcotic agent status: Secondary | ICD-10-CM | POA: Diagnosis not present

## 2018-05-12 DIAGNOSIS — M19041 Primary osteoarthritis, right hand: Secondary | ICD-10-CM | POA: Diagnosis present

## 2018-05-12 DIAGNOSIS — W07XXXD Fall from chair, subsequent encounter: Secondary | ICD-10-CM | POA: Diagnosis present

## 2018-05-12 DIAGNOSIS — Z7982 Long term (current) use of aspirin: Secondary | ICD-10-CM

## 2018-05-12 DIAGNOSIS — Z7189 Other specified counseling: Secondary | ICD-10-CM

## 2018-05-12 DIAGNOSIS — E871 Hypo-osmolality and hyponatremia: Secondary | ICD-10-CM | POA: Diagnosis present

## 2018-05-12 DIAGNOSIS — E876 Hypokalemia: Secondary | ICD-10-CM | POA: Diagnosis not present

## 2018-05-12 DIAGNOSIS — M25551 Pain in right hip: Secondary | ICD-10-CM | POA: Diagnosis present

## 2018-05-12 DIAGNOSIS — I10 Essential (primary) hypertension: Secondary | ICD-10-CM | POA: Diagnosis present

## 2018-05-12 DIAGNOSIS — M7989 Other specified soft tissue disorders: Secondary | ICD-10-CM | POA: Diagnosis not present

## 2018-05-12 DIAGNOSIS — K625 Hemorrhage of anus and rectum: Secondary | ICD-10-CM | POA: Diagnosis present

## 2018-05-12 DIAGNOSIS — E875 Hyperkalemia: Secondary | ICD-10-CM | POA: Diagnosis not present

## 2018-05-12 DIAGNOSIS — K317 Polyp of stomach and duodenum: Secondary | ICD-10-CM | POA: Diagnosis not present

## 2018-05-12 DIAGNOSIS — N179 Acute kidney failure, unspecified: Secondary | ICD-10-CM | POA: Diagnosis present

## 2018-05-12 DIAGNOSIS — E8770 Fluid overload, unspecified: Secondary | ICD-10-CM | POA: Diagnosis not present

## 2018-05-12 DIAGNOSIS — N185 Chronic kidney disease, stage 5: Secondary | ICD-10-CM | POA: Diagnosis present

## 2018-05-12 DIAGNOSIS — G9340 Encephalopathy, unspecified: Secondary | ICD-10-CM | POA: Diagnosis not present

## 2018-05-12 DIAGNOSIS — Z888 Allergy status to other drugs, medicaments and biological substances status: Secondary | ICD-10-CM

## 2018-05-12 DIAGNOSIS — J82 Pulmonary eosinophilia, not elsewhere classified: Secondary | ICD-10-CM | POA: Diagnosis not present

## 2018-05-12 DIAGNOSIS — D508 Other iron deficiency anemias: Secondary | ICD-10-CM | POA: Diagnosis not present

## 2018-05-12 DIAGNOSIS — E669 Obesity, unspecified: Secondary | ICD-10-CM | POA: Diagnosis present

## 2018-05-12 DIAGNOSIS — Z9049 Acquired absence of other specified parts of digestive tract: Secondary | ICD-10-CM

## 2018-05-12 LAB — CBC WITH DIFFERENTIAL/PLATELET
ABS IMMATURE GRANULOCYTES: 0.3 10*3/uL — AB (ref 0.0–0.1)
Basophils Absolute: 0 10*3/uL (ref 0.0–0.1)
Basophils Relative: 0 %
Eosinophils Absolute: 0.1 10*3/uL (ref 0.0–0.7)
Eosinophils Relative: 1 %
HCT: 12.6 % — ABNORMAL LOW (ref 36.0–46.0)
Hemoglobin: 4.1 g/dL — CL (ref 12.0–15.0)
IMMATURE GRANULOCYTES: 1 %
LYMPHS ABS: 1.8 10*3/uL (ref 0.7–4.0)
Lymphocytes Relative: 8 %
MCH: 29.3 pg (ref 26.0–34.0)
MCHC: 32.5 g/dL (ref 30.0–36.0)
MCV: 90 fL (ref 78.0–100.0)
MONOS PCT: 5 %
Monocytes Absolute: 1.1 10*3/uL — ABNORMAL HIGH (ref 0.1–1.0)
NEUTROS ABS: 19.2 10*3/uL — AB (ref 1.7–7.7)
NEUTROS PCT: 85 %
PLATELETS: 305 10*3/uL (ref 150–400)
RBC: 1.4 MIL/uL — AB (ref 3.87–5.11)
RDW: 15.6 % — ABNORMAL HIGH (ref 11.5–15.5)
WBC: 22.6 10*3/uL — AB (ref 4.0–10.5)

## 2018-05-12 LAB — URINALYSIS, ROUTINE W REFLEX MICROSCOPIC
Bilirubin Urine: NEGATIVE
Glucose, UA: NEGATIVE mg/dL
Hgb urine dipstick: NEGATIVE
Ketones, ur: NEGATIVE mg/dL
NITRITE: NEGATIVE
PH: 5 (ref 5.0–8.0)
Protein, ur: NEGATIVE mg/dL
SPECIFIC GRAVITY, URINE: 1.012 (ref 1.005–1.030)

## 2018-05-12 LAB — BASIC METABOLIC PANEL
ANION GAP: 15 (ref 5–15)
BUN: 158 mg/dL — ABNORMAL HIGH (ref 6–20)
CHLORIDE: 90 mmol/L — AB (ref 101–111)
CO2: 25 mmol/L (ref 22–32)
Calcium: 9.7 mg/dL (ref 8.9–10.3)
Creatinine, Ser: 3.62 mg/dL — ABNORMAL HIGH (ref 0.44–1.00)
GFR calc non Af Amer: 11 mL/min — ABNORMAL LOW (ref >60)
GFR, EST AFRICAN AMERICAN: 13 mL/min — AB (ref >60)
Glucose, Bld: 191 mg/dL — ABNORMAL HIGH (ref 65–99)
POTASSIUM: 3.9 mmol/L (ref 3.5–5.1)
Sodium: 130 mmol/L — ABNORMAL LOW (ref 135–145)

## 2018-05-12 LAB — PREPARE RBC (CROSSMATCH)

## 2018-05-12 LAB — POC OCCULT BLOOD, ED: Fecal Occult Bld: POSITIVE — AB

## 2018-05-12 LAB — CBG MONITORING, ED: Glucose-Capillary: 195 mg/dL — ABNORMAL HIGH (ref 65–99)

## 2018-05-12 MED ORDER — SODIUM CHLORIDE 0.9% FLUSH
3.0000 mL | Freq: Two times a day (BID) | INTRAVENOUS | Status: DC
  Administered 2018-05-13 – 2018-05-19 (×7): 3 mL via INTRAVENOUS

## 2018-05-12 MED ORDER — PANTOPRAZOLE SODIUM 40 MG IV SOLR: 40.0000 mg | Freq: Two times a day (BID) | INTRAVENOUS | Status: DC

## 2018-05-12 MED ORDER — ACETAMINOPHEN 325 MG PO TABS
650.0000 mg | ORAL_TABLET | Freq: Four times a day (QID) | ORAL | Status: DC
  Administered 2018-05-13 – 2018-05-22 (×35): 650 mg via ORAL
  Filled 2018-05-12 (×36): qty 2

## 2018-05-12 MED ORDER — ONDANSETRON HCL 4 MG PO TABS
4.0000 mg | ORAL_TABLET | Freq: Four times a day (QID) | ORAL | Status: DC | PRN
  Filled 2018-05-12: qty 1

## 2018-05-12 MED ORDER — SIMVASTATIN 40 MG PO TABS
40.0000 mg | ORAL_TABLET | Freq: Every day | ORAL | Status: DC
  Administered 2018-05-13 – 2018-05-21 (×10): 40 mg via ORAL
  Filled 2018-05-12 (×10): qty 1

## 2018-05-12 MED ORDER — SODIUM CHLORIDE 0.9 % IV BOLUS
500.0000 mL | Freq: Once | INTRAVENOUS | Status: AC
  Administered 2018-05-12: 500 mL via INTRAVENOUS

## 2018-05-12 MED ORDER — ONDANSETRON HCL 4 MG/2ML IJ SOLN: 4.0000 mg | Freq: Four times a day (QID) | INTRAMUSCULAR | Status: DC | PRN

## 2018-05-12 MED ORDER — ALLOPURINOL 100 MG PO TABS
200.0000 mg | ORAL_TABLET | Freq: Every day | ORAL | Status: DC
  Administered 2018-05-13 – 2018-05-22 (×10): 200 mg via ORAL
  Filled 2018-05-12 (×10): qty 2

## 2018-05-12 MED ORDER — OXYCODONE HCL 5 MG PO TABS
5.0000 mg | ORAL_TABLET | Freq: Four times a day (QID) | ORAL | Status: DC | PRN
  Administered 2018-05-12 – 2018-05-19 (×12): 5 mg via ORAL
  Filled 2018-05-12 (×12): qty 1

## 2018-05-12 MED ORDER — VITAMIN B-12 1000 MCG PO TABS
1000.0000 ug | ORAL_TABLET | Freq: Every day | ORAL | Status: DC
  Filled 2018-05-12: qty 1

## 2018-05-12 MED ORDER — LEVOTHYROXINE SODIUM 75 MCG PO TABS
75.0000 ug | ORAL_TABLET | Freq: Every day | ORAL | Status: DC
  Administered 2018-05-13 – 2018-05-22 (×10): 75 ug via ORAL
  Filled 2018-05-12 (×11): qty 1

## 2018-05-12 MED ORDER — SODIUM CHLORIDE 0.9 % IV SOLN
10.0000 mL/h | Freq: Once | INTRAVENOUS | Status: AC
  Administered 2018-05-12: 10 mL/h via INTRAVENOUS

## 2018-05-12 MED ORDER — SODIUM CHLORIDE 0.9 % IV SOLN
8.0000 mg/h | INTRAVENOUS | Status: DC
  Filled 2018-05-12 (×2): qty 80

## 2018-05-12 MED ORDER — SODIUM CHLORIDE 0.9 % IV SOLN
80.0000 mg | Freq: Once | INTRAVENOUS | Status: AC
  Administered 2018-05-12: 80 mg via INTRAVENOUS
  Filled 2018-05-12: qty 80

## 2018-05-12 MED ORDER — METHOCARBAMOL 500 MG PO TABS: 500.0000 mg | ORAL_TABLET | Freq: Three times a day (TID) | ORAL | Status: DC

## 2018-05-12 MED ORDER — METHOCARBAMOL 500 MG PO TABS
500.0000 mg | ORAL_TABLET | Freq: Three times a day (TID) | ORAL | Status: DC | PRN
  Administered 2018-05-17 – 2018-05-21 (×3): 500 mg via ORAL
  Filled 2018-05-12 (×3): qty 1

## 2018-05-12 MED ORDER — INSULIN ASPART 100 UNIT/ML ~~LOC~~ SOLN
0.0000 [IU] | SUBCUTANEOUS | Status: DC
  Administered 2018-05-13 (×3): 2 [IU] via SUBCUTANEOUS
  Administered 2018-05-13: 1 [IU] via SUBCUTANEOUS
  Administered 2018-05-13: 2 [IU] via SUBCUTANEOUS
  Filled 2018-05-12 (×3): qty 1

## 2018-05-12 MED ORDER — LACTATED RINGERS IV SOLN
INTRAVENOUS | Status: AC
  Administered 2018-05-13: 01:00:00 via INTRAVENOUS

## 2018-05-12 NOTE — ED Notes (Signed)
Per MD, Pt OK to have a few ice chips.

## 2018-05-12 NOTE — ED Triage Notes (Signed)
Pt presents for MRI after falling several weeks ago.  Pt was cleared by Dr. Algie Coffer s/p pacemaker placement for imaging.  Pt fell out of recliner at home and has been at [facility] since fall.  Pt not able to bear weight.

## 2018-05-12 NOTE — ED Provider Notes (Signed)
[facility] EMERGENCY DEPARTMENT Provider Note   CSN: [id number] Arrival date & time: 05/12/18  1755     History   Chief Complaint Chief Complaint  Patient presents with  . Hip Pain    HPI Erika Fry is a 72 y.o. female.  HPI   Patient presents for evaluation of ongoing low back pain, radiating to right buttock and right posterior thigh, present for 2 weeks since a fall.  She was initially admitted for evaluation of that, had a CT of the lumbar spine that did not show any acute abnormality.  She was treated with Medrol Dosepak.  She was unable to have MRI of the back at that time because of recent pacemaker placement.  At this time she has now passed the 6-week mark, when her pacemaker allows MRI imaging.  She is unable to eat, at her facility because she is feeling so poorly.  She is unable to take pain medicine because it makes her too sleepy.  She has been unable to participate in physical therapy, because of her back and right leg pain.  There have been no other falls.  There is been no fever, chills, vomiting, dysuria, cough or chest pain.  She is taking her usual prescribed medications.  There are no other known modifying factors.  Past Medical History:  Diagnosis Date  . Arthritis    "knees, thumbs" (03/25/2018)  . CKD (chronic kidney disease), stage IV ([place])   . Diet-controlled diabetes mellitus ([place])   . GERD (gastroesophageal reflux disease)   . Gout    "on daily RX" (03/25/2018)  . Heart murmur   . High cholesterol   . Hypertension   . Hypothyroidism   . Iron deficiency anemia    "had to get an iron infusion"  . Migraine    "used to have them growing up" (03/25/2018)  . Presence of permanent cardiac pacemaker 03/25/2018    Patient Active Problem List   Diagnosis Date Noted  . E. coli UTI 05/10/2018  . Acute respiratory failure with hypoxia ([place]) 05/10/2018  . Hyperlipidemia associated with type 2 diabetes mellitus ([place]) 05/10/2018  .  Acute low back pain with sciatica 05/02/2018  . Acute kidney injury superimposed on chronic kidney disease ([place])   . Toxic metabolic encephalopathy   . UTI (urinary tract infection) 04/30/2018  . Essential hypertension 04/29/2018  . Fall 04/29/2018  . Back pain 04/29/2018  . Leukocytosis 04/29/2018  . Hypokalemia 04/29/2018  . CKD (chronic kidney disease), stage IV ([place])   . Diet-controlled diabetes mellitus ([place])   . GERD (gastroesophageal reflux disease)   . Gout   . High cholesterol   . Hypothyroidism   . Iron deficiency anemia   . AV block, Mobitz 2 03/25/2018  . Mobitz type 2 second degree AV block 03/20/2018  . Aortic stenosis 03/20/2018  . Exertional dyspnea 03/19/2018    Past Surgical History:  Procedure Laterality Date  . APPENDECTOMY    . INSERT / REPLACE / REMOVE PACEMAKER  03/25/2018  . KNEE ARTHROSCOPY Bilateral   . PACEMAKER IMPLANT N/A 03/25/2018   Procedure: PACEMAKER IMPLANT;  Surgeon: Evans Lance, MD;  Location: [facility];  Service: Cardiovascular;  Laterality: N/A;  . PACEMAKER PLACEMENT Left 03/2018  . RIGHT HEART CATH N/A 03/20/2018   Procedure: RIGHT HEART CATH;  Surgeon: Nigel Mormon, MD;  Location: [facility];  Service: Cardiovascular;  Laterality: N/A;  . TEE WITHOUT CARDIOVERSION N/A 03/20/2018  Procedure: TRANSESOPHAGEAL ECHOCARDIOGRAM (TEE);  Surgeon: Nigel Mormon, MD;  Location: [facility] ENDOSCOPY;  Service: Cardiovascular;  Laterality: N/A;  . TONSILLECTOMY       OB History   None      Home Medications    Prior to Admission medications   Medication Sig Start Date End Date Taking? Authorizing Provider  acetaminophen (TYLENOL) 325 MG tablet Take 2 tablets (650 mg total) by mouth every 6 (six) hours as needed for mild pain. 05/05/18  Yes Hongalgi, Lenis Dickinson, MD  allopurinol (ZYLOPRIM) 100 MG tablet Take 2 tablets (200 mg total) by mouth daily. 05/05/18  Yes Hongalgi, Lenis Dickinson, MD  amLODipine (NORVASC) 10 MG tablet Take  10 mg by mouth daily.   Yes [provider]  aspirin EC 81 MG tablet Take 81 mg by mouth daily.   Yes [provider]  Biotin (BIOTIN FORTE) 3 MG TABS Take 3 mg by mouth daily.   Yes [provider]  cetirizine (ZYRTEC) 10 MG tablet Take 10 mg by mouth at bedtime.    Yes [provider]  fluticasone (FLONASE) 50 MCG/ACT nasal spray Place 2 sprays into both nostrils daily as needed for allergies or rhinitis.   Yes [provider]  folic acid (FOLVITE) 1 MG tablet Take 1 mg by mouth daily.   Yes [provider]  furosemide (LASIX) 80 MG tablet Take 80 mg by mouth 2 (two) times daily.    Yes [provider]  hydrALAZINE (APRESOLINE) 50 MG tablet Take 50 mg by mouth 3 (three) times daily.   Yes [provider]  insulin aspart (NOVOLOG) 100 UNIT/ML injection Inject 0-15 Units into the skin 3 (three) times daily with meals. CBG < 70: implement hypoglycemia protocol CBG 70 - 120: 0 units CBG 121 - 150: 2 units CBG 151 - 200: 3 units CBG 201 - 250: 5 units CBG 251 - 300: 8 units CBG 301 - 350: 11 units CBG 351 - 400: 15 units CBG > 400: call MD. 05/05/18  Yes Hongalgi, Lenis Dickinson, MD  insulin aspart (NOVOLOG) 100 UNIT/ML injection Inject 0-5 Units into the skin at bedtime. Correction coverage: HS scale CBG < 70: implement hypoglycemia protocol CBG 70 - 120: 0 units CBG 121 - 150: 0 units CBG 151 - 200: 0 units CBG 201 - 250: 2 units CBG 251 - 300: 3 units CBG 301 - 350: 4 units CBG 351 - 400: 5 units CBG > 400: call MD. 05/05/18  Yes Hongalgi, Lenis Dickinson, MD  insulin glargine (LANTUS) 100 UNIT/ML injection Inject 0.05 mLs (5 Units total) into the skin daily. Patient taking differently: Inject 5 Units into the skin at bedtime.  05/06/18  Yes Hongalgi, Lenis Dickinson, MD  levothyroxine (SYNTHROID, LEVOTHROID) 75 MCG tablet Take 75 mcg by mouth daily before breakfast.   Yes [provider]  methocarbamol (ROBAXIN) 500 MG tablet Take 500  mg by mouth every 8 (eight) hours. 05/06/18 05/13/18 Yes [provider]  Multiple Vitamin (MULTI-VITAMIN PO) Take 1 tablet by mouth daily.   Yes [provider]  polyethylene glycol (MIRALAX / GLYCOLAX) packet Take 17 g by mouth 2 (two) times daily. 05/05/18  Yes Hongalgi, Lenis Dickinson, MD  ranitidine (ZANTAC) 150 MG tablet Take 150 mg by mouth daily.    Yes [provider]  senna (SENOKOT) 8.6 MG TABS tablet Take 1 tablet (8.6 mg total) by mouth daily. 05/06/18  Yes Hongalgi, Lenis Dickinson, MD  simvastatin (ZOCOR) 40 MG tablet  Take 40 mg by mouth at bedtime.    Yes [provider]  vitamin B-12 (CYANOCOBALAMIN) 1000 MCG tablet Take 1,000 mcg by mouth daily.   Yes [provider]  bisacodyl (DULCOLAX) 10 MG suppository Place 1 suppository (10 mg total) rectally daily as needed for moderate constipation. Patient not taking: Reported on 05/12/2018 05/05/18   Modena Jansky, MD  methylPREDNISolone (MEDROL DOSEPAK) 4 MG TBPK tablet Patient is currently on day 3 of 5 days (started 05/03/2018).  Complete 5 days on 05/07/2018 as per Dosepak instructions. Patient not taking: Reported on 05/12/2018 05/05/18   Modena Jansky, MD  oxyCODONE-acetaminophen (PERCOCET) 7.5-325 MG tablet Take 1 tablet by mouth every 6 (six) hours. 05/12/18 05/20/18  [provider]  oxyCODONE-acetaminophen (PERCOCET/ROXICET) 5-325 MG tablet Take 1 tablet by mouth every 12 (twelve) hours as needed for moderate pain. Patient not taking: Reported on 05/12/2018 05/05/18   Modena Jansky, MD    Family History Family History  Problem Relation Age of Onset  . Hypertension Mother   . Diabetes Mellitus II Father   . Heart disease Father   . Gastric cancer Brother   . Diabetes Mellitus II Brother     Social History Social History   Tobacco Use  . Smoking status: Never Smoker  . Smokeless tobacco: Never Used  Substance Use Topics  . Alcohol use: Not Currently  . Drug use: Never      Allergies   Codeine   Review of Systems Review of Systems  All other systems reviewed and are negative.    Physical Exam Updated Vital Signs BP (!) 96/55   Pulse 87   Temp 98.2 F (36.8 C) (Oral)   Resp 16   SpO2 100%   Physical Exam  Constitutional: She is oriented to person, place, and time. She appears well-developed. She appears distressed (She is uncomfortable).  Elderly, frail  HENT:  Head: Normocephalic and atraumatic.  Eyes: Pupils are equal, round, and reactive to light. Conjunctivae and EOM are normal. Right eye exhibits no discharge. Left eye exhibits no discharge. No scleral icterus.  Conjunctiva are pale  Neck: Normal range of motion and phonation normal. Neck supple.  Cardiovascular: Normal rate and regular rhythm.  Hypertensive  Pulmonary/Chest: Effort normal and breath sounds normal. She exhibits no tenderness.  Abdominal: Soft. She exhibits no distension. There is no tenderness. There is no guarding.  Genitourinary:  Genitourinary Comments: Normal anus.  Dark brown stool in rectal vault.  Musculoskeletal:  I am able to externally and internally rotate the right hip without pain.  With passive flexion of the right hip she has pain in the right buttock and right thigh.  Neurological: She is alert and oriented to person, place, and time. She exhibits normal muscle tone.  Skin: Skin is warm and dry.  Psychiatric: She has a normal mood and affect. Her behavior is normal.  Nursing note and vitals reviewed.    ED Treatments / Results  Labs (all labs ordered are listed, but only abnormal results are displayed) Labs Reviewed  URINALYSIS, ROUTINE W REFLEX MICROSCOPIC - Abnormal; Notable for the following components:      Result Value   APPearance HAZY (*)    Leukocytes, UA TRACE (*)    Bacteria, UA RARE (*)    All other components within normal limits  BASIC METABOLIC PANEL - Abnormal; Notable for the following components:   Sodium 130 (*)     Chloride 90 (*)    Glucose, Bld  191 (*)    BUN 158 (*)    Creatinine, Ser 3.62 (*)    GFR calc non Af Amer 11 (*)    GFR calc Af Amer 13 (*)    All other components within normal limits  CBC WITH DIFFERENTIAL/PLATELET - Abnormal; Notable for the following components:   WBC 22.6 (*)    RBC 1.40 (*)    Hemoglobin 4.1 (*)    HCT 12.6 (*)    RDW 15.6 (*)    Neutro Abs 19.2 (*)    Monocytes Absolute 1.1 (*)    Abs Immature Granulocytes 0.3 (*)    All other components within normal limits  POC OCCULT BLOOD, ED - Abnormal; Notable for the following components:   Fecal Occult Bld POSITIVE (*)    All other components within normal limits  TYPE AND SCREEN  PREPARE RBC (CROSSMATCH)    EKG None  Radiology No results found.  Procedures .Critical Care Performed by: Daleen Bo, MD Authorized by: Daleen Bo, MD   Critical care provider statement:    Critical care time (minutes):  40   Critical care start time:  05/12/2018 5:55 PM   Critical care end time:  05/12/2018 8:15 PM   Critical care time was exclusive of:  Separately billable procedures and treating other patients   Critical care was time spent personally by me on the following activities:  Blood draw for specimens, development of treatment plan with patient or surrogate, discussions with consultants, evaluation of patient's response to treatment, examination of patient, obtaining history from patient or surrogate, ordering and performing treatments and interventions, ordering and review of laboratory studies, pulse oximetry, re-evaluation of patient's condition, review of old charts and ordering and review of radiographic studies   (including critical care time)  Medications Ordered in ED Medications  sodium chloride 0.9 % bolus 500 mL (500 mLs Intravenous New Bag/Given 05/12/18 1946)  0.9 %  sodium chloride infusion (has no administration in time range)     Initial Impression / Assessment and Plan / ED Course  I  have reviewed the triage vital signs and the nursing notes.  Pertinent labs & imaging results that were available during my care of the patient were reviewed by me and considered in my medical decision making (see chart for details).  Clinical Course as of May 13 2011  Tue May 12, 2018  1946 Normal  Urinalysis, Routine w reflex microscopic(!) [EW]  1946 Normal except sodium low, chloride low, glucose high, BUN high, creatinine high  Basic metabolic panel(!) [EW]  9629 Normal except white count high, hemoglobin low  CBC with Differential(!!) [EW]  1952 Patient's family members updated on findings of significant anemia, likely blood loss, patient asserts she has not seen any blood loss.  Patient and family members understand that she will require blood transfusion.  They understand she will need to be admitted.  Questions answered as best I could.   [EW]    Clinical Course User Index [EW] Daleen Bo, MD     Patient Vitals for the past 24 hrs:  BP Temp Temp src Pulse Resp SpO2  05/12/18 2000 (!) 96/55 - - 87 16 100 %  05/12/18 1900 (!) 93/59 - - 91 17 98 %  05/12/18 1815 (!) 90/58 - - 92 14 97 %  05/12/18 1758 (!) 137/124 98.2 F (36.8 C) Oral 90 20 99 %    8:12 PM Reevaluation with update and discussion. After initial assessment and treatment, an updated evaluation  reveals patient fairly comfortable, findings discussed and questions answered, plan agreed upon. Daleen Bo   Medical Decision Making: Back pain nonspecific, with radicular pain to right leg.  Suspect sciatica, etiology not clear.  Patient will require MRI evaluation, which can safely be done now that she is 6 weeks post placement of a MRI compatible pacemaker.  Patient has been weak and not feeling well with decreased appetite, and very pale.  She has hypotension, and significant anemia that is likely secondary to blood loss, from the upper GI tract.  Note that BUN is elevated.  Blood transfusion started in the  emergency department.  She has chronic renal insufficiency there is baseline and stable.  She will require blood transfusion, admission and possible GI evaluation with endoscopy.  Doubt active upper GI bleed.  Suspect chronic and/or subacute related to recent administration of steroids to treat back pain.  CRITICAL CARE-yes Performed by: Daleen Bo   Nursing Notes Reviewed/ Care Coordinated Applicable Imaging Reviewed Interpretation of Laboratory Data incorporated into ED treatment  8:15 PM-Consult complete with hospitalist.  He asked that I consult critical care, prior to agreed to admit.  Patient case explained and discussed.  He agrees to admit patient for further evaluation and treatment. Call ended at 9:35 PM   Case discussed with critical care service who agreed that patient can be admitted by hospitalist service, and managed in stepdown unit.  Plan: Admit  Final Clinical Impressions(s) / ED Diagnoses   Final diagnoses:  Iron deficiency anemia due to chronic blood loss  Rectal bleeding  Right-sided low back pain with right-sided sciatica, unspecified chronicity    ED Discharge Orders    None       Daleen Bo, MD 05/12/18 2150

## 2018-05-12 NOTE — ED Notes (Signed)
Purewick placed per pt request.

## 2018-05-12 NOTE — Consult Note (Signed)
PULMONARY / CRITICAL CARE MEDICINE   Name: Erika Fry MRN: [id number] DOB: [birth year]    ADMISSION DATE:  05/12/2018 CONSULTATION DATE: 05/12/18  REFERRING MD: Dr Jonnie Finner   CHIEF COMPLAINT: Anemia  HISTORY OF PRESENT ILLNESS:   55yoF with hx DM, CKD Stave V, GERD, Gout, HTN, Pacemaker, and Chronic Anemia, recent admission 5/29-6/4 with Acute LBP following ground level fall. Found at that time to have a UTI. She was discharged to rehab where she says she has continued to have back pain and constipation. She reports 1 BM yesterday that she didn't look at (wears adult diapers). She now returns to the ER c/o low back pain radiating to her right buttocks and right posterior thigh since her fall 2wks ago. She has been unable to bear weight or participate in physical therapy. At time of my exam she denies SOB, CP, Abd pain, N/V/D. She denies being on any anticoag other than 81mg  ASA daily. She denies any history of prior GIB or Gastric ulcer. She says she has never had a colonoscopy before. No known history of cancer.   PAST MEDICAL HISTORY :  She  has a past medical history of Arthritis, CKD (chronic kidney disease), stage IV ([place]), Diet-controlled diabetes mellitus ([place]), GERD (gastroesophageal reflux disease), Gout, Heart murmur, High cholesterol, Hypertension, Hypothyroidism, Iron deficiency anemia, Migraine, and Presence of permanent cardiac pacemaker (03/25/2018).  PAST SURGICAL HISTORY: She  has a past surgical history that includes TEE without cardioversion (N/A, 03/20/2018); RIGHT HEART CATH (N/A, 03/20/2018); Tonsillectomy; Appendectomy; Knee arthroscopy (Bilateral); Insert / replace / remove pacemaker (03/25/2018); PACEMAKER IMPLANT (N/A, 03/25/2018); and pacemaker placement (Left, 03/2018).  Allergies  Allergen Reactions  . Codeine Other (See Comments)    Increases Pain and couldn't sleep    No current facility-administered medications on file prior to encounter.    Current  Outpatient Medications on File Prior to Encounter  Medication Sig  . acetaminophen (TYLENOL) 325 MG tablet Take 2 tablets (650 mg total) by mouth every 6 (six) hours as needed for mild pain.  [name] Kitchen allopurinol (ZYLOPRIM) 100 MG tablet Take 2 tablets (200 mg total) by mouth daily.  [name] Kitchen amLODipine (NORVASC) 10 MG tablet Take 10 mg by mouth daily.  [name] Kitchen aspirin EC 81 MG tablet Take 81 mg by mouth daily.  . Biotin (BIOTIN FORTE) 3 MG TABS Take 3 mg by mouth daily.  . cetirizine (ZYRTEC) 10 MG tablet Take 10 mg by mouth at bedtime.   . fluticasone (FLONASE) 50 MCG/ACT nasal spray Place 2 sprays into both nostrils daily as needed for allergies or rhinitis.  . folic acid (FOLVITE) 1 MG tablet Take 1 mg by mouth daily.  . furosemide (LASIX) 80 MG tablet Take 80 mg by mouth 2 (two) times daily.   . hydrALAZINE (APRESOLINE) 50 MG tablet Take 50 mg by mouth 3 (three) times daily.  . insulin aspart (NOVOLOG) 100 UNIT/ML injection Inject 0-15 Units into the skin 3 (three) times daily with meals. CBG < 70: implement hypoglycemia protocol CBG 70 - 120: 0 units CBG 121 - 150: 2 units CBG 151 - 200: 3 units CBG 201 - 250: 5 units CBG 251 - 300: 8 units CBG 301 - 350: 11 units CBG 351 - 400: 15 units CBG > 400: call MD.  . insulin aspart (NOVOLOG) 100 UNIT/ML injection Inject 0-5 Units into the skin at bedtime. Correction coverage: HS scale CBG < 70: implement hypoglycemia protocol CBG 70 - 120: 0 units CBG 121 - 150: 0 units  CBG 151 - 200: 0 units CBG 201 - 250: 2 units CBG 251 - 300: 3 units CBG 301 - 350: 4 units CBG 351 - 400: 5 units CBG > 400: call MD.  . insulin glargine (LANTUS) 100 UNIT/ML injection Inject 0.05 mLs (5 Units total) into the skin daily. (Patient taking differently: Inject 5 Units into the skin at bedtime. )  . levothyroxine (SYNTHROID, LEVOTHROID) 75 MCG tablet Take 75 mcg by mouth daily before breakfast.  . methocarbamol (ROBAXIN) 500 MG tablet Take 500 mg by mouth every 8 (eight)  hours.  . Multiple Vitamin (MULTI-VITAMIN PO) Take 1 tablet by mouth daily.  . polyethylene glycol (MIRALAX / GLYCOLAX) packet Take 17 g by mouth 2 (two) times daily.  . ranitidine (ZANTAC) 150 MG tablet Take 150 mg by mouth daily.   [name] Kitchen senna (SENOKOT) 8.6 MG TABS tablet Take 1 tablet (8.6 mg total) by mouth daily.  . simvastatin (ZOCOR) 40 MG tablet Take 40 mg by mouth at bedtime.   . vitamin B-12 (CYANOCOBALAMIN) 1000 MCG tablet Take 1,000 mcg by mouth daily.  . bisacodyl (DULCOLAX) 10 MG suppository Place 1 suppository (10 mg total) rectally daily as needed for moderate constipation. (Patient not taking: Reported on 05/12/2018)  . methylPREDNISolone (MEDROL DOSEPAK) 4 MG TBPK tablet Patient is currently on day 3 of 5 days (started 05/03/2018).  Complete 5 days on 05/07/2018 as per Dosepak instructions. (Patient not taking: Reported on 05/12/2018)  . oxyCODONE-acetaminophen (PERCOCET) 7.5-325 MG tablet Take 1 tablet by mouth every 6 (six) hours.  [name] Kitchen oxyCODONE-acetaminophen (PERCOCET/ROXICET) 5-325 MG tablet Take 1 tablet by mouth every 12 (twelve) hours as needed for moderate pain. (Patient not taking: Reported on 05/12/2018)   FAMILY HISTORY:  Her indicated that the status of her mother is unknown. She indicated that the status of her father is unknown. She indicated that the status of her brother is unknown.  SOCIAL HISTORY: She  reports that she has never smoked. She has never used smokeless tobacco. She reports that she drank alcohol. She reports that she does not use drugs.  REVIEW OF SYSTEMS:   Review of Systems  Constitutional: Positive for malaise/fatigue. Negative for chills and fever.  HENT: Negative.   Eyes: Negative.   Respiratory: Negative.  Negative for shortness of breath.   Cardiovascular: Positive for chest pain.  Gastrointestinal: Positive for blood in stool and constipation. Negative for abdominal pain, nausea and vomiting.  Genitourinary: Positive for flank pain.   Musculoskeletal: Positive for back pain, falls and joint pain.  Neurological: Negative.   Endo/Heme/Allergies: Negative.   Psychiatric/Behavioral: Negative.    SUBJECTIVE:  Lying on ER stretcher, Awake/alert in NAD  VITAL SIGNS: BP 131/67   Pulse (!) 101   Temp 98 F (36.7 C) (Oral)   Resp 17   SpO2 99%   HEMODYNAMICS:  131/67   INTAKE / OUTPUT: No intake/output data recorded.  PHYSICAL EXAMINATION: General: Elderly Female, WDWN, Pale, Awake/alert in NAD Neuro: AAOx3, Moving all extremities, PERRL, Talkative, answering questions, obeying commands  HEENT: MM moist, OP clear Cardiovascular: RRR, 2/6 SEM Lungs: CTA b/l Abdomen: Soft, obese, Mild TTP in RUQ radiating around to right flank, no guarding or rebound, hypoactive bowel sounds  Musculoskeletal: no LE edema  Skin: no rashes   LABS:  BMET Recent Labs  Lab 05/12/18 1827  NA 130*  K 3.9  CL 90*  CO2 25  BUN 158*  CREATININE 3.62*  GLUCOSE 191*   Electrolytes Recent Labs  Lab 05/12/18  1827  CALCIUM 9.7   CBC Recent Labs  Lab 05/12/18 1827  WBC 22.6*  HGB 4.1*  HCT 12.6*  PLT 305   Coag's No results for input(s): APTT, INR in the last 168 hours.  Sepsis Markers No results for input(s): LATICACIDVEN, PROCALCITON, O2SATVEN in the last 168 hours.  ABG No results for input(s): PHART, PCO2ART, PO2ART in the last 168 hours.  Liver Enzymes No results for input(s): AST, ALT, ALKPHOS, BILITOT, ALBUMIN in the last 168 hours.  Cardiac Enzymes No results for input(s): TROPONINI, PROBNP in the last 168 hours.  Glucose No results for input(s): GLUCAP in the last 168 hours.  Imaging No results found.  STUDIES:  CT Abdomen (6/11): pending  CULTURES: None   ANTIBIOTICS: None  SIGNIFICANT EVENTS: 6/11: presented to ER with low back pain found to have Hgb 4.1  LINES/TUBES: 20G and 22G PIV's External urinary catheter  DISCUSSION: 72yoF with hx DM, CKD Stave V, GERD, Gout, HTN,  Pacemaker, and Chronic Anemia, recent admission 5/29-6/4 with Acute LBP following ground level fall. Found at that time to have a UTI. She was discharged to rehab where she says she has continued to have back pain and constipation. She reports 1 BM yesterday that she didn't look at (wears adult diapers). She now returns to the ER c/o low back pain radiating to her right buttocks and right posterior thigh since her fall 2wks ago. She has been unable to bear weight or participate in physical therapy. At time of my exam she denies SOB, CP, Abd pain, N/V/D. She denies being on any anticoag other than 81mg  ASA daily. She denies any history of prior GIB or Gastric ulcer. She says she has never had a colonoscopy before. No known history of cancer.   ASSESSMENT / PLAN:  PULMONARY No active issues   CARDIOVASCULAR 1. Soft BP; Hx HTN: - initial BP 93/59 now improved to 131/67 following 500cc IVF bolus - hold home antihypertensive medications   RENAL 1. CKD Stage V: - creatinine 3.62, baseline creatinine 3.6-5.4; not on hemodialysis - BUN increased today, possibly from the GIB itself - continue to monitor closely; avoid nephrotoxic agents. Monitor UOP.   GASTROINTESTINAL 1. LGIB:  - continue IV PPI - NPO - Hgb 4.1 down from baseline Hgb 8.3 (on 05/05/18); 2u pRBC transfusion ordered. Recheck Hgb following 2nd unit of blood transfusion, after which time decide on further transfusions - will need GI consult in AM.  - has 20G and 22G PIV's; ideally will obtain another large bore PIV - check INR (although is not on chronic anticoag) - patient is stable for admission to SDU with Hospitalists; does not need ICU admission at this time as she is hemodynamically stable and mentating well. This is likely a slow chronic bleed.    HEMATOLOGIC 1. Anemia:  - see plan above - in addition obtain CT Abdomen to rule out RP bleed given her recent fall.  - SCD's for DVT prophylaxis   INFECTIOUS No active issues    ENDOCRINE 1. DM: - NPO; SSI PRN  NEUROLOGIC No active issues    FAMILY  - Updates: updated patient's family member at her bedside - Inter-disciplinary family meet or Palliative Care meeting due by: 05/18/18  45 minutes critical care time  Vernie Murders, MD  Pulmonary and [place] Pager: [phone]  05/12/2018, 11:07 PM

## 2018-05-12 NOTE — H&P (Signed)
History and Physical    Erika Fry IRC:[id number] DOB: 1946-02-07 DOA: 05/12/2018  PCP: [name], MD Patient coming from: SNF, [facility] have personally briefly reviewed patient's old medical records in [place]  Chief Complaint: Fatigue, weakness  HPI: Erika Fry is a 72 y.o. female with medical history significant for second-degree AV block status post permanent pacemaker placement on 04/07/2018, CKD stage IV, hypothyroidism, hypertension, GERD and DM2 who presents to the ED from the skilled nursing facility she was discharged to home on 6/4 with fatigue, weakness, poor appetite, and nausea.  Patient was admitted from 5/29 through 6/4 after mechanical fall which resulted in severe back pain and difficulty ambulating.  MRI was not obtained due to recent placement of PPM, and CT myelogram close contra indicated due to the patient's baseline CKD.  She was started on a steroid Dosepak and discharged to SNF.  Although she continues to have back pain, she is also began to experience the aforementioned fatigue and weakness.  She states that she has been constipated, and is not sure of the color of her stools.  She denies the presence of frank blood in her stools.  She states that she has been nauseated without emesis.  No hemoptysis, no hematuria, no significant bruising, no new rashes.  She denies abdominal pain.  She is not on any blood thinners or antiplatelet agents.  She denies recent fever, chills, cough, shortness of breath, diarrhea, dysuria.  ED Course: In the ED, the patient is afebrile, mildly hypertensive at 90/55 and saturating comfortably on room air.  Labs are notable for WBC 22.6 (was 20.9 on 6/4), hemoglobin 4.1 (was 8.3 on 6/4), platelets 305, NA 130, BUN 158, creatinine 3.62.  Patient received 500 cc normal saline bolus, Protonix 80 mg IV x1 and 2 units of packed red cells while in the ED.  Critical care was called and felt the patient was appropriate for  admission to the hospitalist service.  Gastroneurology was also called, awaiting recommendations.  Review of Systems: As per HPI otherwise 10 point review of systems negative.   Past Medical History:  Diagnosis Date  . Arthritis    "knees, thumbs" (03/25/2018)  . CKD (chronic kidney disease), stage IV ([place])   . Diet-controlled diabetes mellitus ([place])   . GERD (gastroesophageal reflux disease)   . Gout    "on daily RX" (03/25/2018)  . Heart murmur   . High cholesterol   . Hypertension   . Hypothyroidism   . Iron deficiency anemia    "had to get an iron infusion"  . Migraine    "used to have them growing up" (03/25/2018)  . Presence of permanent cardiac pacemaker 03/25/2018    Past Surgical History:  Procedure Laterality Date  . APPENDECTOMY    . INSERT / REPLACE / REMOVE PACEMAKER  03/25/2018  . KNEE ARTHROSCOPY Bilateral   . PACEMAKER IMPLANT N/A 03/25/2018   Procedure: PACEMAKER IMPLANT;  Surgeon: Evans Lance, MD;  Location: [facility];  Service: Cardiovascular;  Laterality: N/A;  . PACEMAKER PLACEMENT Left 03/2018  . RIGHT HEART CATH N/A 03/20/2018   Procedure: RIGHT HEART CATH;  Surgeon: Nigel Mormon, MD;  Location: [facility];  Service: Cardiovascular;  Laterality: N/A;  . TEE WITHOUT CARDIOVERSION N/A 03/20/2018   Procedure: TRANSESOPHAGEAL ECHOCARDIOGRAM (TEE);  Surgeon: Nigel Mormon, MD;  Location: [facility];  Service: Cardiovascular;  Laterality: N/A;  . TONSILLECTOMY  reports that she has never smoked. She has never used smokeless tobacco. She reports that she drank alcohol. She reports that she does not use drugs.  Allergies  Allergen Reactions  . Codeine Other (See Comments)    Increases Pain and couldn't sleep    Family History  Problem Relation Age of Onset  . Hypertension Mother   . Diabetes Mellitus II Father   . Heart disease Father   . Gastric cancer Brother   . Diabetes Mellitus II Brother     Prior to  Admission medications   Medication Sig Start Date End Date Taking? Authorizing Provider  acetaminophen (TYLENOL) 325 MG tablet Take 2 tablets (650 mg total) by mouth every 6 (six) hours as needed for mild pain. 05/05/18  Yes Hongalgi, Lenis Dickinson, MD  allopurinol (ZYLOPRIM) 100 MG tablet Take 2 tablets (200 mg total) by mouth daily. 05/05/18  Yes Hongalgi, Lenis Dickinson, MD  amLODipine (NORVASC) 10 MG tablet Take 10 mg by mouth daily.   Yes [provider]  aspirin EC 81 MG tablet Take 81 mg by mouth daily.   Yes [provider]  Biotin (BIOTIN FORTE) 3 MG TABS Take 3 mg by mouth daily.   Yes [provider]  cetirizine (ZYRTEC) 10 MG tablet Take 10 mg by mouth at bedtime.    Yes [provider]  fluticasone (FLONASE) 50 MCG/ACT nasal spray Place 2 sprays into both nostrils daily as needed for allergies or rhinitis.   Yes [provider]  folic acid (FOLVITE) 1 MG tablet Take 1 mg by mouth daily.   Yes [provider]  furosemide (LASIX) 80 MG tablet Take 80 mg by mouth 2 (two) times daily.    Yes [provider]  hydrALAZINE (APRESOLINE) 50 MG tablet Take 50 mg by mouth 3 (three) times daily.   Yes [provider]  insulin aspart (NOVOLOG) 100 UNIT/ML injection Inject 0-15 Units into the skin 3 (three) times daily with meals. CBG < 70: implement hypoglycemia protocol CBG 70 - 120: 0 units CBG 121 - 150: 2 units CBG 151 - 200: 3 units CBG 201 - 250: 5 units CBG 251 - 300: 8 units CBG 301 - 350: 11 units CBG 351 - 400: 15 units CBG > 400: call MD. 05/05/18  Yes Hongalgi, Lenis Dickinson, MD  insulin aspart (NOVOLOG) 100 UNIT/ML injection Inject 0-5 Units into the skin at bedtime. Correction coverage: HS scale CBG < 70: implement hypoglycemia protocol CBG 70 - 120: 0 units CBG 121 - 150: 0 units CBG 151 - 200: 0 units CBG 201 - 250: 2 units CBG 251 - 300: 3 units CBG 301 - 350: 4 units CBG 351 - 400: 5 units CBG > 400: call MD. 05/05/18  Yes  Hongalgi, Lenis Dickinson, MD  insulin glargine (LANTUS) 100 UNIT/ML injection Inject 0.05 mLs (5 Units total) into the skin daily. Patient taking differently: Inject 5 Units into the skin at bedtime.  05/06/18  Yes Hongalgi, Lenis Dickinson, MD  levothyroxine (SYNTHROID, LEVOTHROID) 75 MCG tablet Take 75 mcg by mouth daily before breakfast.   Yes [provider]  methocarbamol (ROBAXIN) 500 MG tablet Take 500 mg by mouth every 8 (eight) hours. 05/06/18 05/13/18 Yes [provider]  Multiple Vitamin (MULTI-VITAMIN PO) Take 1 tablet by mouth daily.   Yes [provider]  polyethylene glycol (MIRALAX / GLYCOLAX) packet Take 17 g by mouth 2 (two) times daily. 05/05/18  Yes Hongalgi, Lenis Dickinson, MD  ranitidine (  ZANTAC) 150 MG tablet Take 150 mg by mouth daily.    Yes [provider]  senna (SENOKOT) 8.6 MG TABS tablet Take 1 tablet (8.6 mg total) by mouth daily. 05/06/18  Yes Hongalgi, Lenis Dickinson, MD  simvastatin (ZOCOR) 40 MG tablet Take 40 mg by mouth at bedtime.    Yes [provider]  vitamin B-12 (CYANOCOBALAMIN) 1000 MCG tablet Take 1,000 mcg by mouth daily.   Yes [provider]  bisacodyl (DULCOLAX) 10 MG suppository Place 1 suppository (10 mg total) rectally daily as needed for moderate constipation. Patient not taking: Reported on 05/12/2018 05/05/18   Modena Jansky, MD  methylPREDNISolone (MEDROL DOSEPAK) 4 MG TBPK tablet Patient is currently on day 3 of 5 days (started 05/03/2018).  Complete 5 days on 05/07/2018 as per Dosepak instructions. Patient not taking: Reported on 05/12/2018 05/05/18   Modena Jansky, MD  oxyCODONE-acetaminophen (PERCOCET) 7.5-325 MG tablet Take 1 tablet by mouth every 6 (six) hours. 05/12/18 05/20/18  [provider]  oxyCODONE-acetaminophen (PERCOCET/ROXICET) 5-325 MG tablet Take 1 tablet by mouth every 12 (twelve) hours as needed for moderate pain. Patient not taking: Reported on 05/12/2018 05/05/18   Modena Jansky, MD    Physical  Exam: Vitals:   05/12/18 2107 05/12/18 2130 05/12/18 2145 05/12/18 2200  BP: (!) 100/57 121/67 114/64 121/67  Pulse: 92 (!) 102 (!) 102 (!) 104  Resp: 11 15 19  (!) 22  Temp: 98 F (36.7 C)     TempSrc: Oral     SpO2: 100% 97% 96% 100%    Constitutional: NAD, calm, comfortable Eyes: PERRL, (+) conjunctival pallor ENMT: Mucous membranes are dry. Posterior pharynx clear of any exudate or lesions. Neck: normal, supple, no masses Respiratory: clear to auscultation bilaterally, no wheezing, no crackles. Normal respiratory effort. Cardiovascular: Regular rate and rhythm, 3/6 crescendo-decrescendo murmur with radiation across the precordium. No extremity edema. 2+ pedal pulses. Abdomen: mild suprapubic TTP, no masses palpated. Bowel sounds positive.  Musculoskeletal: no clubbing / cyanosis. No joint deformity upper and lower extremities. Good ROM, no contractures. Normal muscle tone.  Skin: no rashes, lesions, ulcers. No induration Neurologic: CN 2-12 grossly intact. Sensation intact, DTR normal. Strength 5/5 in all 4.  Psychiatric: Normal judgment and insight. Alert and oriented x 3. Normal mood.    Labs on Admission: I have personally reviewed following labs and imaging studies  CBC: Recent Labs  Lab 05/12/18 1827  WBC 22.6*  NEUTROABS 19.2*  HGB 4.1*  HCT 12.6*  MCV 90.0  PLT 962   Basic Metabolic Panel: Recent Labs  Lab 05/12/18 1827  NA 130*  K 3.9  CL 90*  CO2 25  GLUCOSE 191*  BUN 158*  CREATININE 3.62*  CALCIUM 9.7   GFR: Estimated Creatinine Clearance: 15.2 mL/min (A) (by C-G formula based on SCr of 3.62 mg/dL (H)). Liver Function Tests: No results for input(s): AST, ALT, ALKPHOS, BILITOT, PROT, ALBUMIN in the last 168 hours. No results for input(s): LIPASE, AMYLASE in the last 168 hours. No results for input(s): AMMONIA in the last 168 hours. Coagulation Profile: No results for input(s): INR, PROTIME in the last 168 hours. Cardiac Enzymes: No results for  input(s): CKTOTAL, CKMB, CKMBINDEX, TROPONINI in the last 168 hours. BNP (last 3 results) No results for input(s): PROBNP in the last 8760 hours. HbA1C: No results for input(s): HGBA1C in the last 72 hours. CBG: No results for input(s): GLUCAP in the last 168 hours. Lipid Profile: No results for input(s): CHOL,  HDL, LDLCALC, TRIG, CHOLHDL, LDLDIRECT in the last 72 hours. Thyroid Function Tests: No results for input(s): TSH, T4TOTAL, FREET4, T3FREE, THYROIDAB in the last 72 hours. Anemia Panel: No results for input(s): VITAMINB12, FOLATE, FERRITIN, TIBC, IRON, RETICCTPCT in the last 72 hours. Urine analysis:    Component Value Date/Time   COLORURINE YELLOW 05/12/2018 1827   APPEARANCEUR HAZY (A) 05/12/2018 1827   LABSPEC 1.012 05/12/2018 1827   PHURINE 5.0 05/12/2018 1827   GLUCOSEU NEGATIVE 05/12/2018 1827   HGBUR NEGATIVE 05/12/2018 1827   BILIRUBINUR NEGATIVE 05/12/2018 1827   KETONESUR NEGATIVE 05/12/2018 1827   PROTEINUR NEGATIVE 05/12/2018 1827   NITRITE NEGATIVE 05/12/2018 1827   LEUKOCYTESUR TRACE (A) 05/12/2018 1827    Radiological Exams on Admission: No results found.  Assessment/Plan Active Problems:   Blood loss anemia   Acute blood loss anemia, likely 2/2 UGIB - Admit to SDU - GI consulted - Transfuse 2 U PRBCs - Hydrate patient - Start IV PPI BID - Stop steroids, hold anti-platelet agents, anticoagulants - Hold antihypertensives - NPO for EGD in AM - Trend Hgb Q6H initially, goal Hgb >7 - Check hemolysis labs, iron, B12, folate - Pt is hemodynamically stable - Monitor on telemetry - Agree with CT a/p to r/o retroperitoneal bleed - STAT IR consult if hemodynamics worsen  Leukocytosis In setting of steroid use; no infectious symptoms. - Stop glucocorticoids as above - Trend fever curve  CKD IV Creatinine is near baseline. BUN significantly elevated in setting of likely UGIB and steroid use. No signs or sxs of uremia. No urgent dialysis  requirement. - Consider Nephrology consult in AM - Strict I/O, insert Foley - Daily weights - Avoid nephrotoxins  Hypovolemic hyponatremia Likely in setting of poor solute intake. - Check U/A, urine/serum osms, urine Na - Hydrate as above - Repeat BMP in AM, avoid overcorrection\  Second degree AV block, Mobitz II - s/p PPM placement - Monitor on telemetry  Low back pain, subsequent encounter - Obtain MRI L spine - Pain control PRN  Hypothyroidism - Continue home Synthroid  DM2 - SSI, FSBS Q4H  DVT prophylaxis: None Code Status: Full Disposition Plan: SNF Consults called: Critical Care, GI Admission status: SDU   Bennie Pierini MD [facility]  If 7PM-7AM, please contact night-coverage [email or web address] Password [email or web address]  05/12/2018, 10:20 PM

## 2018-05-12 NOTE — ED Notes (Signed)
Admitting MD at bedside.

## 2018-05-12 NOTE — ED Notes (Signed)
IV team at bedside 

## 2018-05-13 ENCOUNTER — Inpatient Hospital Stay (HOSPITAL_COMMUNITY): Payer: Medicare Other

## 2018-05-13 DIAGNOSIS — M8610 Other acute osteomyelitis, unspecified site: Secondary | ICD-10-CM

## 2018-05-13 DIAGNOSIS — E1122 Type 2 diabetes mellitus with diabetic chronic kidney disease: Secondary | ICD-10-CM

## 2018-05-13 DIAGNOSIS — K5900 Constipation, unspecified: Secondary | ICD-10-CM

## 2018-05-13 DIAGNOSIS — M4620 Osteomyelitis of vertebra, site unspecified: Secondary | ICD-10-CM

## 2018-05-13 DIAGNOSIS — E1110 Type 2 diabetes mellitus with ketoacidosis without coma: Secondary | ICD-10-CM

## 2018-05-13 DIAGNOSIS — E11319 Type 2 diabetes mellitus with unspecified diabetic retinopathy without macular edema: Secondary | ICD-10-CM

## 2018-05-13 DIAGNOSIS — M5416 Radiculopathy, lumbar region: Secondary | ICD-10-CM

## 2018-05-13 DIAGNOSIS — E118 Type 2 diabetes mellitus with unspecified complications: Secondary | ICD-10-CM

## 2018-05-13 DIAGNOSIS — M5441 Lumbago with sciatica, right side: Secondary | ICD-10-CM

## 2018-05-13 DIAGNOSIS — K921 Melena: Secondary | ICD-10-CM

## 2018-05-13 LAB — URINALYSIS, ROUTINE W REFLEX MICROSCOPIC
BILIRUBIN URINE: NEGATIVE
GLUCOSE, UA: NEGATIVE mg/dL
KETONES UR: NEGATIVE mg/dL
Nitrite: NEGATIVE
PH: 5 (ref 5.0–8.0)
Protein, ur: NEGATIVE mg/dL
Specific Gravity, Urine: 1.013 (ref 1.005–1.030)

## 2018-05-13 LAB — CBC
HEMATOCRIT: 20 % — AB (ref 36.0–46.0)
HEMOGLOBIN: 6.9 g/dL — AB (ref 12.0–15.0)
MCH: 30.4 pg (ref 26.0–34.0)
MCHC: 34.5 g/dL (ref 30.0–36.0)
MCV: 88.1 fL (ref 78.0–100.0)
Platelets: 290 10*3/uL (ref 150–400)
RBC: 2.27 MIL/uL — ABNORMAL LOW (ref 3.87–5.11)
RDW: 14.7 % (ref 11.5–15.5)
WBC: 21.4 10*3/uL — AB (ref 4.0–10.5)

## 2018-05-13 LAB — LACTATE DEHYDROGENASE: LDH: 227 U/L — ABNORMAL HIGH (ref 98–192)

## 2018-05-13 LAB — PHOSPHORUS: Phosphorus: 7 mg/dL — ABNORMAL HIGH (ref 2.5–4.6)

## 2018-05-13 LAB — BASIC METABOLIC PANEL
Anion gap: 15 (ref 5–15)
BUN: 157 mg/dL — AB (ref 6–20)
CHLORIDE: 96 mmol/L — AB (ref 101–111)
CO2: 20 mmol/L — ABNORMAL LOW (ref 22–32)
Calcium: 9.2 mg/dL (ref 8.9–10.3)
Creatinine, Ser: 3.54 mg/dL — ABNORMAL HIGH (ref 0.44–1.00)
GFR calc Af Amer: 14 mL/min — ABNORMAL LOW (ref >60)
GFR, EST NON AFRICAN AMERICAN: 12 mL/min — AB (ref >60)
GLUCOSE: 157 mg/dL — AB (ref 65–99)
POTASSIUM: 3.9 mmol/L (ref 3.5–5.1)
Sodium: 131 mmol/L — ABNORMAL LOW (ref 135–145)

## 2018-05-13 LAB — PROTIME-INR
INR: 1.01
Prothrombin Time: 13.2 seconds (ref 11.4–15.2)

## 2018-05-13 LAB — CBG MONITORING, ED
GLUCOSE-CAPILLARY: 152 mg/dL — AB (ref 65–99)
GLUCOSE-CAPILLARY: 61 mg/dL — AB (ref 65–99)
GLUCOSE-CAPILLARY: 156 mg/dL — AB (ref 65–99)
GLUCOSE-CAPILLARY: 158 mg/dL — AB (ref 65–99)
Glucose-Capillary: 188 mg/dL — ABNORMAL HIGH (ref 65–99)
Glucose-Capillary: 196 mg/dL — ABNORMAL HIGH (ref 65–99)

## 2018-05-13 LAB — GLUCOSE, CAPILLARY
GLUCOSE-CAPILLARY: 148 mg/dL — AB (ref 65–99)
Glucose-Capillary: 160 mg/dL — ABNORMAL HIGH (ref 65–99)
Glucose-Capillary: 135 mg/dL — ABNORMAL HIGH (ref 65–99)

## 2018-05-13 LAB — IRON AND TIBC
Iron: 91 ug/dL (ref 28–170)
Saturation Ratios: 30 % (ref 10.4–31.8)
TIBC: 304 ug/dL (ref 250–450)
UIBC: 213 ug/dL

## 2018-05-13 LAB — HEPATIC FUNCTION PANEL
ALK PHOS: 62 U/L (ref 38–126)
ALT: 11 U/L — AB (ref 14–54)
AST: 22 U/L (ref 15–41)
Albumin: 2.4 g/dL — ABNORMAL LOW (ref 3.5–5.0)
BILIRUBIN DIRECT: 0.3 mg/dL (ref 0.1–0.5)
BILIRUBIN INDIRECT: 0.8 mg/dL (ref 0.3–0.9)
Total Bilirubin: 1.1 mg/dL (ref 0.3–1.2)
Total Protein: 5.7 g/dL — ABNORMAL LOW (ref 6.5–8.1)

## 2018-05-13 LAB — APTT: aPTT: 25 seconds (ref 24–36)

## 2018-05-13 LAB — OSMOLALITY, URINE: Osmolality, Ur: 387 mOsm/kg (ref 300–900)

## 2018-05-13 LAB — RETICULOCYTES
RBC.: 2.27 MIL/uL — AB (ref 3.87–5.11)
RETIC CT PCT: 4.9 % — AB (ref 0.4–3.1)
Retic Count, Absolute: 111.2 10*3/uL (ref 19.0–186.0)

## 2018-05-13 LAB — MAGNESIUM: Magnesium: 2.2 mg/dL (ref 1.7–2.4)

## 2018-05-13 LAB — HEMOGLOBIN AND HEMATOCRIT, BLOOD
HCT: 24.2 % — ABNORMAL LOW (ref 36.0–46.0)
Hemoglobin: 8.2 g/dL — ABNORMAL LOW (ref 12.0–15.0)

## 2018-05-13 LAB — PREPARE RBC (CROSSMATCH)

## 2018-05-13 LAB — OSMOLALITY: Osmolality: 328 mOsm/kg (ref 275–295)

## 2018-05-13 LAB — FERRITIN: Ferritin: 278 ng/mL (ref 11–307)

## 2018-05-13 LAB — SODIUM, URINE, RANDOM: Sodium, Ur: 19 mmol/L

## 2018-05-13 LAB — VITAMIN B12: VITAMIN B 12: 5448 pg/mL — AB (ref 180–914)

## 2018-05-13 MED ORDER — POLYETHYLENE GLYCOL 3350 17 G PO PACK
17.0000 g | PACK | ORAL | Status: AC
  Administered 2018-05-13: 17 g via ORAL
  Filled 2018-05-13: qty 1

## 2018-05-13 MED ORDER — DEXAMETHASONE SODIUM PHOSPHATE 10 MG/ML IJ SOLN
4.0000 mg | Freq: Four times a day (QID) | INTRAMUSCULAR | Status: DC
  Administered 2018-05-14: 17:00:00 via INTRAVENOUS
  Administered 2018-05-14 – 2018-05-16 (×11): 4 mg via INTRAVENOUS
  Filled 2018-05-13 (×12): qty 1

## 2018-05-13 MED ORDER — PIPERACILLIN-TAZOBACTAM IN DEX 2-0.25 GM/50ML IV SOLN
2.2500 g | Freq: Four times a day (QID) | INTRAVENOUS | Status: DC
  Administered 2018-05-14 – 2018-05-15 (×6): 2.25 g via INTRAVENOUS
  Filled 2018-05-13 (×8): qty 50

## 2018-05-13 MED ORDER — PANTOPRAZOLE SODIUM 40 MG IV SOLR
40.0000 mg | Freq: Two times a day (BID) | INTRAVENOUS | Status: DC
  Administered 2018-05-13 – 2018-05-18 (×11): 40 mg via INTRAVENOUS
  Filled 2018-05-13 (×11): qty 40

## 2018-05-13 MED ORDER — DEXAMETHASONE SODIUM PHOSPHATE 10 MG/ML IJ SOLN
10.0000 mg | Freq: Once | INTRAMUSCULAR | Status: AC
  Administered 2018-05-13: 10 mg via INTRAVENOUS
  Filled 2018-05-13: qty 1

## 2018-05-13 MED ORDER — INSULIN ASPART 100 UNIT/ML ~~LOC~~ SOLN
0.0000 [IU] | SUBCUTANEOUS | Status: DC
  Administered 2018-05-14 (×2): 5 [IU] via SUBCUTANEOUS
  Administered 2018-05-14 (×2): 3 [IU] via SUBCUTANEOUS
  Administered 2018-05-14: 5 [IU] via SUBCUTANEOUS

## 2018-05-13 MED ORDER — PIPERACILLIN-TAZOBACTAM 3.375 G IVPB 30 MIN
3.3750 g | Freq: Once | INTRAVENOUS | Status: AC
  Administered 2018-05-13: 3.375 g via INTRAVENOUS
  Filled 2018-05-13: qty 50

## 2018-05-13 MED ORDER — VANCOMYCIN HCL 10 G IV SOLR
1750.0000 mg | Freq: Once | INTRAVENOUS | Status: AC
  Administered 2018-05-13: 1750 mg via INTRAVENOUS
  Filled 2018-05-13: qty 1750

## 2018-05-13 MED ORDER — SODIUM CHLORIDE 0.9 % IV SOLN
Freq: Once | INTRAVENOUS | Status: AC
  Administered 2018-05-13: 09:00:00 via INTRAVENOUS

## 2018-05-13 MED ORDER — SODIUM CHLORIDE 0.9 % IV SOLN
INTRAVENOUS | Status: DC
  Administered 2018-05-13 – 2018-05-19 (×8): via INTRAVENOUS

## 2018-05-13 MED ORDER — GABAPENTIN 100 MG PO CAPS
100.0000 mg | ORAL_CAPSULE | Freq: Three times a day (TID) | ORAL | Status: DC
  Administered 2018-05-13 – 2018-05-17 (×12): 100 mg via ORAL
  Filled 2018-05-13 (×12): qty 1

## 2018-05-13 MED ORDER — BISACODYL 10 MG RE SUPP
10.0000 mg | Freq: Four times a day (QID) | RECTAL | Status: AC
  Filled 2018-05-13: qty 1

## 2018-05-13 NOTE — Progress Notes (Signed)
PROGRESS NOTE    Erika Fry  DUK:[id number] DOB: [birth year] DOA: 05/12/2018 PCP: [name], MD   Brief Narrative:  72 y.o. WF PMHx  second-degree AV block S/P permanent pacemaker placement on 04/07/2018, HTN, CKD stage IV, Hypothyroidism, Diabetes type 2, Iron Deficiency anemia   Presents to the ED [facility],  she was discharged to home on 6/4 with fatigue, weakness, poor appetite, and nausea.  Patient was admitted from 5/29 through 6/4 after mechanical fall which resulted in severe back pain and difficulty ambulating.  MRI was not obtained due to recent placement of PPM, and CT myelogram close contra indicated due to the patient's baseline CKD.  She was started on a steroid Dosepak and discharged to SNF.  Although she continues to have back pain, she is also began to experience the aforementioned fatigue and weakness.  She states that she has been constipated, and is not sure of the color of her stools.  She denies the presence of frank blood in her stools.  She states that she has been nauseated without emesis.  No hemoptysis, no hematuria, no significant bruising, no new rashes.  She denies abdominal pain.  She is not on any blood thinners or antiplatelet agents.  She denies recent fever, chills, cough, shortness of breath, diarrhea, dysuria.   ED Course: In the ED, the patient is afebrile, mildly hypertensive at 90/55 and saturating comfortably on room air.  Labs are notable for WBC 22.6 (was 20.9 on 6/4), hemoglobin 4.1 (was 8.3 on 6/4), platelets 305, NA 130, BUN 158, creatinine 3.62.  Patient received 500 cc normal saline bolus, Protonix 80 mg IV x1 and 2 units of packed red cells while in the ED.  Critical care was called and felt the patient was appropriate for admission to the hospitalist service.  Gastroneurology was also called, awaiting recommendations.    Subjective: 6/12 A/O x4, negative CP, negative S OB, negative abdominal pain.  States fell out of her recliner  could not get up.  Currently moderate to severe right hip pain described as pins-and-needles shooting down her right leg.  RIGHT leg sciatica pain chronic which was recently treated with a steroid Dosepak without significant improvement.  Unable to obtain MRI secondary to patient's pacer   Assessment & Plan:   Active Problems:   Blood loss anemia  Acute blood loss anemia/upper GI bleed -Patient seen by [facility] on 6/12 recommend EGD. -6/11 transfused 2 units PRBC  -6/12 transfuse 1 unit PRBC -Protonix 40 mg BID  -N.p.o. - Hold antiplatelet/anticoagulants - Transfuse: See CHF  -Hemolysis labs pending, anemia panel pending  Leukocytosis -No overt sign of infection - Most likely secondary to steroid use but will monitor closely.     CKD stage IV (baseline Cr= 3.0-4.0) Recent Labs  Lab 05/12/18 1827 05/13/18 0545  CREATININE 3.62* 3.54*  -Baseline - Avoid nephrotoxins - Monitor closely  Hypovolemic hyponatremia - Normal saline 37ml/hr - Trending up monitor closely  CHF - Echocardiogram 03/20/2018: Hyperdynamic LV with severe basal septal hypertrophy and LVOT obstruction. -Strict in and out - Daily weight - Transfuse hemoglobin<8  Second degree AV block, Mobitz II - s/p PPM placement - Monitor on telemetry   Low back pain, subsequent encounter/RIGHT LOWER extremity sciatica -Unable to obtain MRI secondary to pacer - 6/12 start Gabapentin at 100 mg TID -Robaxin 500 mg TID -Decadron 10 mg x 1 -Decadron 4 mg QID  Acute discitis/osteomyelitis -L-spine MRI concerning for acute osteomyelitis/discitis in addition phlegmon vs abscess -  Begin empiric antibiotics: Vancomycin + Zosyn - Decadron - Consult IR to drain RIGHT psoas muscle 11 x 19 mm paraspinous fluid collection, send for labs   Lumbar nerve root impingement -See low back pain -See acute discitis   HypoThyroidism -Synthroid-75 mcg daily  Diabetes type 2 controlled with complication - 9/98 hemoglobin  A1c= 6.3 -6/12 increase moderate SSI       DVT prophylaxis: SCD Code Status: Full Family Communication: None Disposition Plan: TBD   Consultants:  [facility]    Procedures/Significant Events:  6/12 L-spine MRI:-Findings concerning for acute osteomyelitis discitis involving the right aspect of the L4-5  -paraspinous edema within the adjacent right psoas muscle with superimposed 11 x 19 mm paraspinous fluid collection. Probable phlegmon within the right L4-5 neural foramen without frank epidural abscess or other collection. - Superimposed multifactorial degenerative changes at L4-5 with resultant moderate to severe right with moderate left L4 foraminal stenosis. - Small left foraminal disc protrusion at L2-3, contacting the exiting left L2 nerve root in the left neural foramen. -Shallow right foraminal/extraforaminal disc protrusion at L3-4,contacting the exiting right L3 nerve root as it courses out of the right neural foramen.      I have personally reviewed and interpreted all radiology studies and my findings are as above.  VENTILATOR SETTINGS:    Cultures      Antimicrobials: Anti-infectives (From admission, onward)   Start     Stop   05/14/18 0300  piperacillin-tazobactam (ZOSYN) IVPB 2.25 g         05/13/18 2000  vancomycin (VANCOCIN) 1,750 mg in sodium chloride 0.9 % 500 mL IVPB         05/13/18 2000  piperacillin-tazobactam (ZOSYN) IVPB 3.375 g             Devices    LINES / TUBES:      Continuous Infusions: . lactated ringers 100 mL/hr at 05/13/18 0103     Objective: Vitals:   05/13/18 0430 05/13/18 0530 05/13/18 0600 05/13/18 0700  BP: 127/68 119/60 114/67 98/60  Pulse: 90 86 84 82  Resp: 12 16 (!) 24 (!) 34  Temp:      TempSrc:      SpO2: 90% 93% 92% 91%    Intake/Output Summary (Last 24 hours) at 05/13/2018 0729 Last data filed at 05/13/2018 0327 Gross per 24 hour  Intake 2005 ml  Output -  Net 2005 ml   There were no vitals  filed for this visit.  Examination:  General: A/O x4, No acute respiratory distress Lungs: Clear to auscultation bilaterally without wheezes or crackles Cardiovascular: Regular rate and rhythm without murmur gallop or rub normal S1 and S2 Abdomen: negative abdominal pain, nondistended, positive soft, bowel sounds, no rebound, no ascites, no appreciable mass Extremities: No significant cyanosis, clubbing, or edema bilateral lower extremities Skin: Negative rashes, lesions, ulcers Psychiatric:  Negative depression, negative anxiety, negative fatigue, negative mania  Central nervous system:  Cranial nerves II through XII intact, tongue/uvula midline, all extremities muscle strength 5/5, sensation intact throughout,  negative dysarthria, negative expressive aphasia, negative receptive aphasia.  Pain to palpation right lateral hip  .     Data Reviewed: Care during the described time interval was provided by me .  I have reviewed this patient's available data, including medical history, events of note, physical examination, and all test results as part of my evaluation.   CBC: Recent Labs  Lab 05/12/18 1827 05/13/18 0427  WBC 22.6* 21.4*  NEUTROABS 19.2*  --  HGB 4.1* 6.9*  HCT 12.6* 20.0*  MCV 90.0 88.1  PLT 305 973   Basic Metabolic Panel: Recent Labs  Lab 05/12/18 1827 05/13/18 0427 05/13/18 0545  NA 130*  --  131*  K 3.9  --  3.9  CL 90*  --  96*  CO2 25  --  20*  GLUCOSE 191*  --  157*  BUN 158*  --  157*  CREATININE 3.62*  --  3.54*  CALCIUM 9.7  --  9.2  MG  --  2.2  --   PHOS  --  7.0*  --    GFR: Estimated Creatinine Clearance: 15.5 mL/min (A) (by C-G formula based on SCr of 3.54 mg/dL (H)). Liver Function Tests: Recent Labs  Lab 05/13/18 0427  AST 22  ALT 11*  ALKPHOS 62  BILITOT 1.1  PROT 5.7*  ALBUMIN 2.4*   No results for input(s): LIPASE, AMYLASE in the last 168 hours. No results for input(s): AMMONIA in the last 168 hours. Coagulation  Profile: Recent Labs  Lab 05/13/18 0427  INR 1.01   Cardiac Enzymes: No results for input(s): CKTOTAL, CKMB, CKMBINDEX, TROPONINI in the last 168 hours. BNP (last 3 results) No results for input(s): PROBNP in the last 8760 hours. HbA1C: No results for input(s): HGBA1C in the last 72 hours. CBG: Recent Labs  Lab 05/12/18 2306 05/13/18 0108 05/13/18 0313 05/13/18 0541 05/13/18 0544  GLUCAP 195* 188* 196* 61* 152*   Lipid Profile: No results for input(s): CHOL, HDL, LDLCALC, TRIG, CHOLHDL, LDLDIRECT in the last 72 hours. Thyroid Function Tests: No results for input(s): TSH, T4TOTAL, FREET4, T3FREE, THYROIDAB in the last 72 hours. Anemia Panel: Recent Labs    05/13/18 0427  RETICCTPCT 4.9*   Urine analysis:    Component Value Date/Time   COLORURINE YELLOW 05/13/2018 0140   APPEARANCEUR HAZY (A) 05/13/2018 0140   LABSPEC 1.013 05/13/2018 0140   PHURINE 5.0 05/13/2018 0140   GLUCOSEU NEGATIVE 05/13/2018 0140   HGBUR SMALL (A) 05/13/2018 0140   BILIRUBINUR NEGATIVE 05/13/2018 0140   KETONESUR NEGATIVE 05/13/2018 0140   PROTEINUR NEGATIVE 05/13/2018 0140   NITRITE NEGATIVE 05/13/2018 0140   LEUKOCYTESUR SMALL (A) 05/13/2018 0140   Sepsis Labs: @LABRCNTIP (procalcitonin:4,lacticidven:4)  )No results found for this or any previous visit (from the past 240 hour(s)).       Radiology Studies: Ct Abdomen Pelvis Wo Contrast  Result Date: 05/13/2018 CLINICAL DATA:  Anemia, unexplained, asymptomatic. Right lower back pain. Weakness. Decreased appetite. EXAM: CT ABDOMEN AND PELVIS WITHOUT CONTRAST TECHNIQUE: Multidetector CT imaging of the abdomen and pelvis was performed following the standard protocol without IV contrast. COMPARISON:  Pelvis and lumbar spine CT 04/29/2018 FINDINGS: Lower chest: Pacemaker partially included. There are coronary artery calcifications. Small circumferential pericardial effusion. Hepatobiliary: No focal hepatic lesion on noncontrast exam. Mild  gallbladder distention containing layering high-density material. No pericholecystic stranding allowing for motion artifact. No biliary dilatation. Pancreas: No ductal dilatation or inflammation. Motion artifact partially obscures evaluation of the head and uncinate process. Spleen: Normal in size.  No focal abnormality on noncontrast exam. Adrenals/Urinary Tract: No adrenal nodule. Moderate hydronephrosis without ureteral dilatation. There is an 8 mm stone in the right renal pelvis. No ureteral calculi. No left hydronephrosis. Nonobstructing 6 mm stone in the upper left kidney. Subcentimeter low-density in the upper left kidney is too small to characterize but may be cyst or angiomyolipoma. Urinary bladder is partially distended without wall thickening or stone. Stomach/Bowel: Small hiatal hernia. Stomach physiologically distended. Mild  motion artifact through the upper abdomen limits evaluation of abdominal bowel loops. Allowing for this, no evidence of inflammation, obstruction or bowel wall thickening. Appendix not visualized, surgically absent per history. Moderate diffuse colonic stool burden transverse colonic tortuosity. Rectum is distended with stool measuring 7.9 cm. No definite rectal wall thickening, there is mild perirectal and presacral edema. Vascular/Lymphatic: Aortic atherosclerosis and tortuosity without aneurysm. No enlarged abdominal or pelvic lymph nodes allowing for noncontrast exam motion artifact in the abdomen. Reproductive: Uterus is atrophic, normal for age.  No adnexal mass. Other: Tiny fat containing umbilical hernia. No free air, free fluid, or intra-abdominal fluid collection. Musculoskeletal: Degenerative disc disease at L4-L5. There are no acute or suspicious osseous abnormalities. IMPRESSION: 1. Mild right hydronephrosis with a nonobstructing 8 mm stone in the right kidney. No ureteral calculi or ureteral dilatation. Cause of hydronephrosis is not identified. Nonobstructing left  renal stone without left hydronephrosis. 2. Moderate colonic stool burden with colonic tortuosity consistent with constipation. There is fecal impaction with rectal distention and perirectal edema. 3. Layering hyperdense gallbladder contents likely combination of stones and sludge. 4.  Aortic Atherosclerosis (ICD10-I70.0). Electronically Signed   By: Jeb Levering M.D.   On: 05/13/2018 04:06        Scheduled Meds: . acetaminophen  650 mg Oral Q6H  . allopurinol  200 mg Oral Daily  . insulin aspart  0-9 Units Subcutaneous Q4H  . levothyroxine  75 mcg Oral QAC breakfast  . [START ON 05/16/2018] pantoprazole  40 mg Intravenous Q12H  . simvastatin  40 mg Oral QHS  . sodium chloride flush  3 mL Intravenous Q12H  . vitamin B-12  1,000 mcg Oral Daily   Continuous Infusions: . lactated ringers 100 mL/hr at 05/13/18 0103     LOS: 1 day    Time spent: 40 minutes    Temprence Rhines, Geraldo Docker, MD [facility] Pager [phone]   If 7PM-7AM, please contact night-coverage [email or web address] Password [facility] 05/13/2018, 7:29 AM

## 2018-05-13 NOTE — ED Notes (Signed)
SWOT nurse needed at 10am to accompany PT to MRI with [employer] rep

## 2018-05-13 NOTE — ED Notes (Signed)
Care handoff to [place] , [place]

## 2018-05-13 NOTE — Progress Notes (Signed)
RN administered Soap sud enema. Pt had a very large bowel movement all over the bed. Unable to measure the amount. After an hour pt  refused the suppository dulcolax, stated "I already had a huge bowel movement, I don't want anything else." Will continue to monitor pt.

## 2018-05-13 NOTE — ED Notes (Signed)
Gastro PA at bedside

## 2018-05-13 NOTE — ED Notes (Signed)
Pt. To CT via stretcher. 

## 2018-05-13 NOTE — ED Notes (Signed)
Daughter in law, Anderson Malta, takes two silver colored bands with stones, and a third silver ring with a clear stone home. Pt is aware and agrees.

## 2018-05-13 NOTE — Progress Notes (Signed)
Inpatient Diabetes Program Recommendations  AACE/ADA: New Consensus Statement on Inpatient Glycemic Control (2015)  Target Ranges:  Prepandial:   less than 140 mg/dL      Peak postprandial:   less than 180 mg/dL (1-2 hours)      Critically ill patients:  140 - 180 mg/dL   Lab Results  Component Value Date   GLUCAP 148 (H) 05/13/2018   HGBA1C 6.3 (H) 04/30/2018    Review of Glycemic ControlResults for WHITLEIGH, GARRAMONE (MRN [id number]) as of 05/13/2018 13:43  Ref. Range 05/13/2018 05:41 05/13/2018 05:44 05/13/2018 07:59 05/13/2018 10:36 05/13/2018 12:50  Glucose-Capillary Latest Ref Range: 65 - 99 mg/dL 61 (L) 152 (H) 156 (H) 158 (H) 148 (H)   Diabetes history: Type 2 DM  Outpatient Diabetes medications: Novolog moderate tid with meals and HS, Lantus 5 units daily Current orders for Inpatient glycemic control:  Novolog sensitive q 4 hours  Inpatient Diabetes Program Recommendations:   Note hypoglycemia after patient received Novolog 2 hours apart.  Due to reduced CrCl, consider reducing frequency of Novolog to q 6 hours.   Thanks, Adah Perl, RN, BC-ADM Inpatient Diabetes Coordinator Pager [phone] (8a-5p)

## 2018-05-13 NOTE — Progress Notes (Signed)
Pharmacy Antibiotic Note  Erika Fry is a 73 y.o. female admitted on 05/12/2018 with osteomyelitis and r/o spinal abscess.  Pharmacy has been consulted for Vancomycin and Zosyn dosing. Patient has a history of CKD. SCr 3.54 on admission which seems to be close to baseline. CrCl ~ 15 mL/min. WBC 21.4  Plan: -Vancomycin 1750 mg IV once, then dosing per levels -Zosyn 3.375 gm IV once, then start Zosyn 2.25 gm IV Q 6 hours given CKD  -Monitor CBC, renal fx, cultures and clinical progress  Height: 5\' 5"  (165.1 cm) Weight: 189 lb 6 oz (85.9 kg) IBW/kg (Calculated) : 57  Temp (24hrs), Avg:98.3 F (36.8 C), Min:97.5 F (36.4 C), Max:98.8 F (37.1 C)  Recent Labs  Lab 05/12/18 1827 05/13/18 0427 05/13/18 0545  WBC 22.6* 21.4*  --   CREATININE 3.62*  --  3.54*    Estimated Creatinine Clearance: 15.1 mL/min (A) (by C-G formula based on SCr of 3.54 mg/dL (H)).    Allergies  Allergen Reactions  . Codeine Other (See Comments)    Increases Pain and couldn't sleep    Antimicrobials this admission: Vanc 6/12 >>  Zosyn 6/12 >>   Dose adjustments this admission: None   Microbiology results:   Thank you for allowing pharmacy to be a part of this patient's care.  Albertina Parr, PharmD., BCPS Clinical Pharmacist Clinical phone for 05/13/18 until 10:30pm: [phone] If after 10:30pm, please call main pharmacy at: [phone]

## 2018-05-13 NOTE — ED Notes (Signed)
Daughter in law: Adult nurse; [phone]

## 2018-05-13 NOTE — ED Notes (Signed)
PT asleep at this time. Call bell in reach.

## 2018-05-13 NOTE — Consult Note (Addendum)
[facility] Consult: 4:39 PM 05/13/2018  LOS: 1 day    Referring Provider: Dr Dia Crawford  Primary Care Physician:  [name], MD Primary Gastroenterologist:  unassigned     Reason for Consultation:  Dark FOBT + stool and hgb 4.1   HPI: Erika Fry is a 72 y.o. female.  PMH stage 4 CKD.  Iron deficiency anemia, treated with parenteral iron in 2018.  No previous blood transfusions.  Diet controlled DM 2.  GERD.  Diabetic retinopathy.  Hypothyroidism.  Gout.  S/P cardiac pacemaker.  S/P appendectomy remotely. No prior EGD, colonoscopy per her recall and nothing suggests this in epic. For GERD she takes ranitidine 150 mg daily.  Also daily oral vitamin B12, 81 mg aspirin.  She is not on blood thinners or platelet disrupting medication.  2 previous admissions this year.  For 1 day in late April 2019 for pacemaker insertion to address 2:1 heart block. 2nd admission this year was 5/29-05/05/2018 following a fall.  She slipped out of her recliner at home.  Unable to image with MRI due to pacemaker.  Was treated with a Medrol Dosepak for acute back pain and sciatica.  Hgb dropped as low as 6.7, but no records of transfusion.  Hgb 8.2 at discharge.  Completed 3 days of Rocephin for suspected E. coli cystitis.  Discharged with as needed oxycodone.  Discharged to [facility] for several days and then returned home a few days ago.  Constipation had been a problem at the nursing home and at home.  She had about 1 bowel movement a week compared with her usual daily stools.  Her last bowel movement was 05/05/2018.  Also complains of nausea for about a month along with anorexia.  Has not had increase incidence of heartburn which is rare for her.  No dysphagia.  No abdominal pain.  Overnight yesterday the  patient was, as usual, sleeping in her recliner and became confused and was unable to get herself out of the recliner.  Her daughter-in-law called and patient explained situation..  Family arrived and found her on the floor, next to the recliner.  Transported to the ED. Hgb 4.1.  MCV 90.  Previous hemoglobins 8.2 one week ago. Has received 3 U PRBCs so far.   Received an 80 mg bolus of IV Protonix but is not on the drip and there is actually no current Protonix order. AKI versus progression to stage 5 CKD.  Current BUN/creatinine ratio with marketed increase in BUN probably due to GI bleed. Other than low albumin, her LFTs are unremarkable. PT/INR 13.2/1.01.  Noncontrast CT abdomen pelvis shows mild right hydronephrosis with nonobstructing right kidney stone.  Tortuous colon with moderate stool burden.  Fecal impaction with rectal distention and perirectal edema.  Layering gallbladder contents likely reflecting stones and sludge.  Aortic atherosclerosis    Past Medical History:  Diagnosis Date  . Arthritis    "knees, thumbs" (03/25/2018)  . CKD (chronic kidney disease), stage IV ([place])   . Diet-controlled diabetes mellitus ([place])   .  GERD (gastroesophageal reflux disease)   . Gout    "on daily RX" (03/25/2018)  . Heart murmur   . High cholesterol   . Hypertension   . Hypothyroidism   . Iron deficiency anemia    "had to get an iron infusion"  . Migraine    "used to have them growing up" (03/25/2018)  . Presence of permanent cardiac pacemaker 03/25/2018    Past Surgical History:  Procedure Laterality Date  . APPENDECTOMY    . INSERT / REPLACE / REMOVE PACEMAKER  03/25/2018  . KNEE ARTHROSCOPY Bilateral   . PACEMAKER IMPLANT N/A 03/25/2018   Procedure: PACEMAKER IMPLANT;  Surgeon: Evans Lance, MD;  Location: [facility];  Service: Cardiovascular;  Laterality: N/A;  . PACEMAKER PLACEMENT Left 03/2018  . RIGHT HEART CATH N/A 03/20/2018   Procedure: RIGHT HEART CATH;  Surgeon:  Nigel Mormon, MD;  Location: [facility];  Service: Cardiovascular;  Laterality: N/A;  . TEE WITHOUT CARDIOVERSION N/A 03/20/2018   Procedure: TRANSESOPHAGEAL ECHOCARDIOGRAM (TEE);  Surgeon: Nigel Mormon, MD;  Location: [facility];  Service: Cardiovascular;  Laterality: N/A;  . TONSILLECTOMY      Prior to Admission medications   Medication Sig Start Date End Date Taking? Authorizing Provider  acetaminophen (TYLENOL) 325 MG tablet Take 2 tablets (650 mg total) by mouth every 6 (six) hours as needed for mild pain. 05/05/18  Yes Hongalgi, Lenis Dickinson, MD  allopurinol (ZYLOPRIM) 100 MG tablet Take 2 tablets (200 mg total) by mouth daily. 05/05/18  Yes Hongalgi, Lenis Dickinson, MD  amLODipine (NORVASC) 10 MG tablet Take 10 mg by mouth daily.   Yes [provider]  aspirin EC 81 MG tablet Take 81 mg by mouth daily.   Yes [provider]  Biotin (BIOTIN FORTE) 3 MG TABS Take 3 mg by mouth daily.   Yes [provider]  cetirizine (ZYRTEC) 10 MG tablet Take 10 mg by mouth at bedtime.    Yes [provider]  fluticasone (FLONASE) 50 MCG/ACT nasal spray Place 2 sprays into both nostrils daily as needed for allergies or rhinitis.   Yes [provider]  folic acid (FOLVITE) 1 MG tablet Take 1 mg by mouth daily.   Yes [provider]  furosemide (LASIX) 80 MG tablet Take 80 mg by mouth 2 (two) times daily.    Yes [provider]  hydrALAZINE (APRESOLINE) 50 MG tablet Take 50 mg by mouth 3 (three) times daily.   Yes [provider]  insulin aspart (NOVOLOG) 100 UNIT/ML injection Inject 0-15 Units into the skin 3 (three) times daily with meals. CBG < 70: implement hypoglycemia protocol CBG 70 - 120: 0 units CBG 121 - 150: 2 units CBG 151 - 200: 3 units CBG 201 - 250: 5 units CBG 251 - 300: 8 units CBG 301 - 350: 11 units CBG 351 - 400: 15 units CBG > 400: call MD. 05/05/18  Yes Hongalgi, Lenis Dickinson, MD  insulin aspart (NOVOLOG)  100 UNIT/ML injection Inject 0-5 Units into the skin at bedtime. Correction coverage: HS scale CBG < 70: implement hypoglycemia protocol CBG 70 - 120: 0 units CBG 121 - 150: 0 units CBG 151 - 200: 0 units CBG 201 - 250: 2 units CBG 251 - 300: 3 units CBG 301 - 350: 4 units CBG 351 - 400: 5 units CBG > 400: call MD. 05/05/18  Yes Hongalgi, Lenis Dickinson, MD  insulin glargine (LANTUS) 100 UNIT/ML injection Inject  0.05 mLs (5 Units total) into the skin daily. Patient taking differently: Inject 5 Units into the skin at bedtime.  05/06/18  Yes Hongalgi, Lenis Dickinson, MD  levothyroxine (SYNTHROID, LEVOTHROID) 75 MCG tablet Take 75 mcg by mouth daily before breakfast.   Yes [provider]  methocarbamol (ROBAXIN) 500 MG tablet Take 500 mg by mouth every 8 (eight) hours. 05/06/18 05/13/18 Yes [provider]  Multiple Vitamin (MULTI-VITAMIN PO) Take 1 tablet by mouth daily.   Yes [provider]  polyethylene glycol (MIRALAX / GLYCOLAX) packet Take 17 g by mouth 2 (two) times daily. 05/05/18  Yes Hongalgi, Lenis Dickinson, MD  ranitidine (ZANTAC) 150 MG tablet Take 150 mg by mouth daily.    Yes [provider]  senna (SENOKOT) 8.6 MG TABS tablet Take 1 tablet (8.6 mg total) by mouth daily. 05/06/18  Yes Hongalgi, Lenis Dickinson, MD  simvastatin (ZOCOR) 40 MG tablet Take 40 mg by mouth at bedtime.    Yes [provider]  vitamin B-12 (CYANOCOBALAMIN) 1000 MCG tablet Take 1,000 mcg by mouth daily.   Yes [provider]  bisacodyl (DULCOLAX) 10 MG suppository Place 1 suppository (10 mg total) rectally daily as needed for moderate constipation. Patient not taking: Reported on 05/12/2018 05/05/18   Modena Jansky, MD  methylPREDNISolone (MEDROL DOSEPAK) 4 MG TBPK tablet Patient is currently on day 3 of 5 days (started 05/03/2018).  Complete 5 days on 05/07/2018 as per Dosepak instructions. Patient not taking: Reported on 05/12/2018 05/05/18   Modena Jansky, MD  oxyCODONE-acetaminophen  (PERCOCET) 7.5-325 MG tablet Take 1 tablet by mouth every 6 (six) hours. 05/12/18 05/20/18  [provider]  oxyCODONE-acetaminophen (PERCOCET/ROXICET) 5-325 MG tablet Take 1 tablet by mouth every 12 (twelve) hours as needed for moderate pain. Patient not taking: Reported on 05/12/2018 05/05/18   Modena Jansky, MD    Scheduled Meds: . acetaminophen  650 mg Oral Q6H  . allopurinol  200 mg Oral Daily  . bisacodyl  10 mg Rectal Q6H  . gabapentin  100 mg Oral TID  . insulin aspart  0-9 Units Subcutaneous Q4H  . levothyroxine  75 mcg Oral QAC breakfast  . pantoprazole  40 mg Intravenous Q12H  . polyethylene glycol  17 g Oral Q2H  . simvastatin  40 mg Oral QHS  . sodium chloride flush  3 mL Intravenous Q12H   Infusions: . sodium chloride 75 mL/hr at 05/13/18 1544   PRN Meds: methocarbamol, ondansetron **OR** ondansetron (ZOFRAN) IV, oxyCODONE   Allergies as of 05/12/2018 - Review Complete 05/12/2018  Allergen Reaction Noted  . Codeine Other (See Comments) 06/26/2017    Family History  Problem Relation Age of Onset  . Hypertension Mother   . Diabetes Mellitus II Father   . Heart disease Father   . Gastric cancer Brother   . Diabetes Mellitus II Brother     Social History   Socioeconomic History  . Marital status: Widowed    Spouse name: Not on file  . Number of children: Not on file  . Years of education: Not on file  . Highest education level: Not on file  Occupational History  . Not on file  Social Needs  . Financial resource strain: Not on file  . Food insecurity:    Worry: Not on file    Inability: Not on file  . Transportation needs:    Medical: Not on file    Non-medical: Not on file  Tobacco Use  .  Smoking status: Never Smoker  . Smokeless tobacco: Never Used  Substance and Sexual Activity  . Alcohol use: Not Currently  . Drug use: Never  . Sexual activity: Not Currently  Lifestyle  . Physical activity:    Days per week: Not on file    Minutes  per session: Not on file  . Stress: Not on file  Relationships  . Social connections:    Talks on phone: Not on file    Gets together: Not on file    Attends religious service: Not on file    Active member of club or organization: Not on file    Attends meetings of clubs or organizations: Not on file    Relationship status: Not on file  . Intimate partner violence:    Fear of current or ex partner: Not on file    Emotionally abused: Not on file    Physically abused: Not on file    Forced sexual activity: Not on file  Other Topics Concern  . Not on file  Social History Narrative  . Not on file    REVIEW OF SYSTEMS: Constitutional: Weakness. ENT:  No nose bleeds Pulm: No trouble breathing.  No cough. CV:  No palpitations, no chest pain.  Chronic lower extremity edema. GU:  No hematuria, no frequency, no incontinence. GI:  See HPI Heme: Denies unusual bleeding or bruising. Transfusions:  See HPI.  Patient unaware of previous transfusions. Neuro:  No headaches, no peripheral tingling or numbness Derm:  No itching, no rash or sores.  Endocrine:  No sweats or chills.  No polyuria or dysuria Immunization: Not queried. Travel:  None beyond local counties in last few months.    PHYSICAL EXAM: Vital signs in last 24 hours: Vitals:   05/13/18 1252 05/13/18 1425  BP: 139/66 127/65  Pulse:  89  Resp:  (!) 21  Temp: 98.5 F (36.9 C)   SpO2:  (!) 85%   Wt Readings from Last 3 Encounters:  05/13/18 189 lb 6 oz (85.9 kg)  05/06/18 200 lb (90.7 kg)  04/29/18 200 lb (90.7 kg)    General: Obese, unwell, uncomfortable but alert WF laying on stretcher in the ED. Head: Facial asymmetry or swelling.  No signs of head trauma. Eyes: No scleral icterus.  No conjunctival pallor.  EOMI. Ears: Slightly HOH. Nose: No congestion or discharge. Mouth: Dry oral mucosa.  Tongue midline.  Good dentition. Neck: No JVD, masses, thyromegaly. Lungs: Clear bilaterally.  No labored breathing or  cough. Heart: RRR.  No MRG.  3-6 crescendo/decrescendo murmur. Abdomen: Obese.  Not tender.  Not distended.  Bowel sounds hypoactive.  No HSM, bruits, masses.. Rectal: Liquid dark stool is 4+ FOBT positive.  No stool palpable in the rectal vault which is enlarged to/commodious.  No masses Musc/Skeltl: No joint redness or swelling or gross deformity.  Patient complaining vociferously of pain in her right hip and lower back and leg. Extremities: Nonpitting pedal/lower extremity edema. Neurologic: Oriented x3.  Appropriate.  Not confused.  Slow speech and psychomotor response. Skin: No telangiectasia, rashes or sores. Tattoos: None Nodes: No cervical adenopathy. Psych: Slightly anxious.  Flat affect/depressed.  Cooperative.  Intake/Output from previous day: 06/11 0701 - 06/12 0700 In: 2005 [I.V.:44.7; Blood:882; IV Piggyback:1078.3] Out: -  Intake/Output this shift: Total I/O In: 388 [P.O.:50; I.V.:23; Blood:315] Out: 1 [Stool:1]  LAB RESULTS: Recent Labs    05/12/18 1827 05/13/18 0427 05/13/18 1547  WBC 22.6* 21.4*  --   HGB 4.1* 6.9* 8.2*  HCT 12.6* 20.0* 24.2*  PLT 305 290  --    BMET Lab Results  Component Value Date   NA 131 (L) 05/13/2018   NA 130 (L) 05/12/2018   NA 128 (L) 05/05/2018   K 3.9 05/13/2018   K 3.9 05/12/2018   K 4.9 05/05/2018   CL 96 (L) 05/13/2018   CL 90 (L) 05/12/2018   CL 93 (L) 05/05/2018   CO2 20 (L) 05/13/2018   CO2 25 05/12/2018   CO2 23 05/05/2018   GLUCOSE 157 (H) 05/13/2018   GLUCOSE 191 (H) 05/12/2018   GLUCOSE 265 (H) 05/05/2018   BUN 157 (H) 05/13/2018   BUN 158 (H) 05/12/2018   BUN 116 (H) 05/05/2018   CREATININE 3.54 (H) 05/13/2018   CREATININE 3.62 (H) 05/12/2018   CREATININE 3.77 (H) 05/05/2018   CALCIUM 9.2 05/13/2018   CALCIUM 9.7 05/12/2018   CALCIUM 9.5 05/05/2018   LFT Recent Labs    05/13/18 0427  PROT 5.7*  ALBUMIN 2.4*  AST 22  ALT 11*  ALKPHOS 62  BILITOT 1.1  BILIDIR 0.3  IBILI 0.8   PT/INR Lab  Results  Component Value Date   INR 1.01 05/13/2018    RADIOLOGY STUDIES: Ct Abdomen Pelvis Wo Contrast  Result Date: 05/13/2018 CLINICAL DATA:  Anemia, unexplained, asymptomatic. Right lower back pain. Weakness. Decreased appetite. EXAM: CT ABDOMEN AND PELVIS WITHOUT CONTRAST TECHNIQUE: Multidetector CT imaging of the abdomen and pelvis was performed following the standard protocol without IV contrast. COMPARISON:  Pelvis and lumbar spine CT 04/29/2018 FINDINGS: Lower chest: Pacemaker partially included. There are coronary artery calcifications. Small circumferential pericardial effusion. Hepatobiliary: No focal hepatic lesion on noncontrast exam. Mild gallbladder distention containing layering high-density material. No pericholecystic stranding allowing for motion artifact. No biliary dilatation. Pancreas: No ductal dilatation or inflammation. Motion artifact partially obscures evaluation of the head and uncinate process. Spleen: Normal in size.  No focal abnormality on noncontrast exam. Adrenals/Urinary Tract: No adrenal nodule. Moderate hydronephrosis without ureteral dilatation. There is an 8 mm stone in the right renal pelvis. No ureteral calculi. No left hydronephrosis. Nonobstructing 6 mm stone in the upper left kidney. Subcentimeter low-density in the upper left kidney is too small to characterize but may be cyst or angiomyolipoma. Urinary bladder is partially distended without wall thickening or stone. Stomach/Bowel: Small hiatal hernia. Stomach physiologically distended. Mild motion artifact through the upper abdomen limits evaluation of abdominal bowel loops. Allowing for this, no evidence of inflammation, obstruction or bowel wall thickening. Appendix not visualized, surgically absent per history. Moderate diffuse colonic stool burden transverse colonic tortuosity. Rectum is distended with stool measuring 7.9 cm. No definite rectal wall thickening, there is mild perirectal and presacral edema.  Vascular/Lymphatic: Aortic atherosclerosis and tortuosity without aneurysm. No enlarged abdominal or pelvic lymph nodes allowing for noncontrast exam motion artifact in the abdomen. Reproductive: Uterus is atrophic, normal for age.  No adnexal mass. Other: Tiny fat containing umbilical hernia. No free air, free fluid, or intra-abdominal fluid collection. Musculoskeletal: Degenerative disc disease at L4-L5. There are no acute or suspicious osseous abnormalities. IMPRESSION: 1. Mild right hydronephrosis with a nonobstructing 8 mm stone in the right kidney. No ureteral calculi or ureteral dilatation. Cause of hydronephrosis is not identified. Nonobstructing left renal stone without left hydronephrosis. 2. Moderate colonic stool burden with colonic tortuosity consistent with constipation. There is fecal impaction with rectal distention and perirectal edema. 3. Layering hyperdense gallbladder contents likely combination of stones and sludge. 4.  Aortic Atherosclerosis (ICD10-I70.0). Electronically  Signed   By: Jeb Levering M.D.   On: 05/13/2018 04:06   Mr Lumbar Spine Wo Contrast  Result Date: 05/13/2018 CLINICAL DATA:  Initial evaluation for persistent right-sided low back pain. EXAM: MRI LUMBAR SPINE WITHOUT CONTRAST TECHNIQUE: Multiplanar, multisequence MR imaging of the lumbar spine was performed. No intravenous contrast was administered. COMPARISON:  Prior CT from earlier the same day. FINDINGS: Segmentation: Normal segmentation. Lowest well-formed disc labeled the L5-S1 level. Alignment: Mild levoscoliosis of the thoracolumbar spine. Alignment otherwise normal with preservation of the normal lumbar lordosis. No listhesis. Vertebrae: Vertebral body heights maintained without evidence for acute or chronic fracture. Bone marrow signal intensity diffusely heterogeneous and decreased on T1 weighted imaging, suspected be related underlying anemia. No discrete or worrisome osseous lesions. There is abnormal marrow  edema centered about the right aspect of the L4-5 interspace, involving both the L4 and L5 vertebral bodies (series 6, image 5). Fluid density present within the intervening L4-5 interspace. Soft tissue edema within the adjacent right psoas muscle. Findings concerning for acute osteomyelitis discitis. Superimposed loculated paraspinous collection at the posterior right psoas muscle measures 19 x 11 mm (series 6, image 2). Probable phlegmonous changes within the adjacent right L4-5 neural foramen. No discernible epidural abscess or other collection. Soft tissue edema extends into the right posterior paraspinous musculature as well. Mild extension into the left posterior paraspinous soft tissues noted (series 6, image 17). Conus medullaris and cauda equina: Conus extends to the L1-2 level. Conus and cauda equina appear normal. Paraspinal and other soft tissues: Paraspinous changes related to osteomyelitis discitis at L4-5 as above. Paraspinous soft tissues demonstrate no other acute abnormality. Scattered T2 hyperintense cyst noted within the kidneys bilaterally. Few of these cystic lesions demonstrate intrinsic T1 hyperintensity, likely reflecting proteinaceous and/or hemorrhagic cysts, largest of which measures 14 mm at the interpolar right kidney. Paraspinous soft tissues otherwise within normal limits. Disc levels: T11-12: Mild diffuse disc bulge with disc desiccation and reactive endplate changes. Right-sided facet degeneration. No significant stenosis. T12-L1: Mild disc bulge. Superimposed small left paracentral disc protrusion indents the left ventral thecal sac. No significant stenosis. L1-2: Diffuse disc bulge, slightly asymmetric to the right. Mild to moderate facet and ligament flavum hypertrophy. No significant stenosis. L2-3: Diffuse disc bulge with mild to moderate facet hypertrophy. Superimposed broad left subarticular/foraminal disc protrusion, with the protruding disc contacting the exiting left L2  nerve root in the left neural foramen (series 8, image 21). Mild left lateral recess and foraminal stenosis. L3-4: Diffuse disc bulge. Superimposed broad right foraminal/extraforaminal disc protrusion contacts the exiting right L3 nerve root as it courses out of the right neural foramen (series 8, image 26). Mild to moderate facet hypertrophy. Resultant mild canal with left lateral recess narrowing. Mild left L3 foraminal stenosis. L4-5: Chronic intervertebral disc space narrowing with diffuse disc bulge and reactive endplate changes. Abnormal fluid density within the right L4-5 interspace with changes suggestive of osteomyelitis discitis. Edema extends into the right pedicles of L4 and L5 with phlegmonous change within the right L4-5 neural foramen. No frank epidural abscess or other collection identified on this noncontrast examination. Mild facet and ligament flavum hypertrophy. Resultant mild canal stenosis. Moderate to severe right with mild left lateral recess narrowing. Severe right with moderate left L4 foraminal stenosis (series 7, image 5). L5-S1: Negative interspace. Moderate right with mild left facet hypertrophy. No significant stenosis. IMPRESSION: 1. Findings concerning for acute osteomyelitis discitis involving the right aspect of the L4-5 interspace as above. Associated paraspinous  edema within the adjacent right psoas muscle with superimposed 11 x 19 mm paraspinous fluid collection. Probable phlegmon within the right L4-5 neural foramen without frank epidural abscess or other collection. 2. Superimposed multifactorial degenerative changes at L4-5 with resultant moderate to severe right with moderate left L4 foraminal stenosis. 3. Small left foraminal disc protrusion at L2-3, contacting the exiting left L2 nerve root in the left neural foramen. 4. Shallow right foraminal/extraforaminal disc protrusion at L3-4, contacting the exiting right L3 nerve root as it courses out of the right neural foramen.  Electronically Signed   By: Jeannine Boga M.D.   On: 05/13/2018 15:00     IMPRESSION:   *   Melenic stool on DRE though patient constipated and last actual bowel movement over 1 week ago.  Not on blood thinners.  Patient denies previous upper endoscopy and colonoscopy.  *    Constipation subjectively and by CT imaging.  *   Acute blood loss anemia on top of anemia of chronic kidney disease.  Treated with parenteral iron infusion last year.  On vitamin B12 orally as outpatient. Iron studies do not confirm iron deficiency anemia.  B12 significantly elevated.  *   Hypomagnesemia.    *    Stage 4 CKD.  AKI currently but may have evolved into stage 5 CKD.  *   DM2, diet controlled  *   Sciatica, right leg/hip pain ongoing.  Recently treated with Medrol Dosepak for same. Significantly debilitated by pain. MRI today concerning for L4-5 discitis/osteomyelitis along with multi-level lumbar degenerative spine and disc dz.  No abx as of 4:40 PM today.    *      2:1 heart block.  Status post cardiac pacemaker 03/25/2018.    PLAN:     *    EGD, timing per Dr. Havery Moros  *   Check TSH in AM, do not see recent assay.  Check Hgb hematocrit this afternoon, CBC in the morning.  *    Ordered Protonix 40 mg IV twice daily, first dose this morning.  *     Dulcolax suppositories x 2 and oral MiraLAX x 3 doses.  *   With markedly elevated B12 level, I stopped the oral B12.   Azucena Freed  05/13/2018, 4:39 PM Phone [phone]

## 2018-05-14 DIAGNOSIS — K651 Peritoneal abscess: Secondary | ICD-10-CM

## 2018-05-14 LAB — BASIC METABOLIC PANEL
Anion gap: 19 — ABNORMAL HIGH (ref 5–15)
BUN: 131 mg/dL — AB (ref 6–20)
CALCIUM: 9.6 mg/dL (ref 8.9–10.3)
CO2: 20 mmol/L — ABNORMAL LOW (ref 22–32)
CREATININE: 3.31 mg/dL — AB (ref 0.44–1.00)
Chloride: 97 mmol/L — ABNORMAL LOW (ref 101–111)
GFR calc non Af Amer: 13 mL/min — ABNORMAL LOW (ref >60)
GFR, EST AFRICAN AMERICAN: 15 mL/min — AB (ref >60)
Glucose, Bld: 214 mg/dL — ABNORMAL HIGH (ref 65–99)
Potassium: 4.4 mmol/L (ref 3.5–5.1)
SODIUM: 136 mmol/L (ref 135–145)

## 2018-05-14 LAB — FOLATE RBC
FOLATE, RBC: 2730 ng/mL (ref >498)
Folate, Hemolysate: 548.8 ng/mL
Hematocrit: 20.1 % — ABNORMAL LOW (ref 34.0–46.6)

## 2018-05-14 LAB — CBC WITH DIFFERENTIAL/PLATELET
Abs Immature Granulocytes: 0.1 10*3/uL (ref 0.0–0.1)
BASOS ABS: 0 10*3/uL (ref 0.0–0.1)
Basophils Relative: 0 %
EOS ABS: 0 10*3/uL (ref 0.0–0.7)
Eosinophils Relative: 0 %
HCT: 22.1 % — ABNORMAL LOW (ref 36.0–46.0)
Hemoglobin: 7.5 g/dL — ABNORMAL LOW (ref 12.0–15.0)
Immature Granulocytes: 1 %
Lymphocytes Relative: 5 %
Lymphs Abs: 0.5 10*3/uL — ABNORMAL LOW (ref 0.7–4.0)
MCH: 29.6 pg (ref 26.0–34.0)
MCHC: 33.9 g/dL (ref 30.0–36.0)
MCV: 87.4 fL (ref 78.0–100.0)
Monocytes Absolute: 0 10*3/uL — ABNORMAL LOW (ref 0.1–1.0)
Monocytes Relative: 0 %
Neutro Abs: 11.3 10*3/uL — ABNORMAL HIGH (ref 1.7–7.7)
Neutrophils Relative %: 94 %
Platelets: 288 10*3/uL (ref 150–400)
RBC: 2.53 MIL/uL — AB (ref 3.87–5.11)
RDW: 15.6 % — AB (ref 11.5–15.5)
WBC: 12 10*3/uL — AB (ref 4.0–10.5)

## 2018-05-14 LAB — PREPARE RBC (CROSSMATCH)

## 2018-05-14 LAB — GLUCOSE, CAPILLARY
GLUCOSE-CAPILLARY: 177 mg/dL — AB (ref 65–99)
GLUCOSE-CAPILLARY: 223 mg/dL — AB (ref 65–99)
GLUCOSE-CAPILLARY: 261 mg/dL — AB (ref 65–99)
Glucose-Capillary: 185 mg/dL — ABNORMAL HIGH (ref 65–99)
Glucose-Capillary: 235 mg/dL — ABNORMAL HIGH (ref 65–99)
Glucose-Capillary: 219 mg/dL — ABNORMAL HIGH (ref 65–99)

## 2018-05-14 LAB — MAGNESIUM: MAGNESIUM: 2.4 mg/dL (ref 1.7–2.4)

## 2018-05-14 LAB — PROTIME-INR
INR: 1.01
PROTHROMBIN TIME: 13.2 s (ref 11.4–15.2)

## 2018-05-14 LAB — HAPTOGLOBIN: HAPTOGLOBIN: 225 mg/dL — AB (ref 34–200)

## 2018-05-14 MED ORDER — VANCOMYCIN HCL IN DEXTROSE 1-5 GM/200ML-% IV SOLN
1000.0000 mg | INTRAVENOUS | Status: DC
  Administered 2018-05-15 – 2018-05-17 (×2): 1000 mg via INTRAVENOUS
  Filled 2018-05-14 (×3): qty 200

## 2018-05-14 MED ORDER — INSULIN ASPART 100 UNIT/ML ~~LOC~~ SOLN
0.0000 [IU] | SUBCUTANEOUS | Status: DC
  Administered 2018-05-14: 11 [IU] via SUBCUTANEOUS
  Administered 2018-05-15: 15 [IU] via SUBCUTANEOUS
  Administered 2018-05-15: 4 [IU] via SUBCUTANEOUS
  Administered 2018-05-15: 15 [IU] via SUBCUTANEOUS
  Administered 2018-05-15: 4 [IU] via SUBCUTANEOUS
  Administered 2018-05-15 (×2): 7 [IU] via SUBCUTANEOUS
  Administered 2018-05-15 – 2018-05-16 (×3): 11 [IU] via SUBCUTANEOUS
  Administered 2018-05-16: 7 [IU] via SUBCUTANEOUS
  Administered 2018-05-16: 11 [IU] via SUBCUTANEOUS
  Administered 2018-05-16: 7 [IU] via SUBCUTANEOUS
  Administered 2018-05-17: 4 [IU] via SUBCUTANEOUS
  Administered 2018-05-17 (×2): 11 [IU] via SUBCUTANEOUS
  Administered 2018-05-17 (×2): 15 [IU] via SUBCUTANEOUS
  Administered 2018-05-17: 7 [IU] via SUBCUTANEOUS
  Administered 2018-05-18: 3 [IU] via SUBCUTANEOUS
  Administered 2018-05-18: 4 [IU] via SUBCUTANEOUS
  Administered 2018-05-18: 3 [IU] via SUBCUTANEOUS
  Administered 2018-05-18: 15 [IU] via SUBCUTANEOUS
  Administered 2018-05-18: 3 [IU] via SUBCUTANEOUS
  Administered 2018-05-18: 11 [IU] via SUBCUTANEOUS
  Administered 2018-05-19: 7 [IU] via SUBCUTANEOUS
  Administered 2018-05-19: 11 [IU] via SUBCUTANEOUS

## 2018-05-14 MED ORDER — SODIUM CHLORIDE 0.9 % IV SOLN
Freq: Once | INTRAVENOUS | Status: AC
  Administered 2018-05-14: 16:00:00 via INTRAVENOUS

## 2018-05-14 MED ORDER — POLYETHYLENE GLYCOL 3350 17 G PO PACK
17.0000 g | PACK | Freq: Two times a day (BID) | ORAL | Status: DC
  Administered 2018-05-14 – 2018-05-15 (×3): 17 g via ORAL
  Filled 2018-05-14 (×11): qty 1

## 2018-05-14 NOTE — Progress Notes (Signed)
Inpatient Diabetes Program Recommendations  AACE/ADA: New Consensus Statement on Inpatient Glycemic Control (2015)  Target Ranges:  Prepandial:   less than 140 mg/dL      Peak postprandial:   less than 180 mg/dL (1-2 hours)      Critically ill patients:  140 - 180 mg/dL   Lab Results  Component Value Date   GLUCAP 235 (H) 05/14/2018   HGBA1C 6.3 (H) 04/30/2018    Review of Glycemic ControlResults for EULALA, NEWCOMBE (MRN [id number]) as of 05/14/2018 12:35  Ref. Range 05/13/2018 20:10 05/14/2018 00:51 05/14/2018 04:05 05/14/2018 07:46 05/14/2018 11:40  Glucose-Capillary Latest Ref Range: 65 - 99 mg/dL 135 (H) 177 (H) 185 (H) 223 (H) 235 (H)    Diabetes history: Type 2 DM  Outpatient Diabetes medications:  Novolog 0-15 units tid with meals, Lantus 5 units daily Current orders for Inpatient glycemic control:  Novolog moderate q 4 hours Decadron 4 mg IV q 6 hours Inpatient Diabetes Program Recommendations:    Please consider adding Lantus 6 units daily.    Thanks,  Adah Perl, RN, BC-ADM Inpatient Diabetes Coordinator Pager [phone] (8a-5p)

## 2018-05-14 NOTE — [place] FL2 (Signed)
[place] MEDICAID FL2 LEVEL OF CARE SCREENING TOOL     IDENTIFICATION  Patient Name: Erika Fry Birthdate: 07/08/1946 Sex: female Admission Date (Current Location): 05/12/2018  [facility] and [place] Number:  Herbalist and Address:  [facility], [place] [address] [place], [place] [address]      Provider Number: [id number]  Attending Physician Name and Address:  Allie Bossier, MD  Relative Name and Phone Number:  Anderson Malta Daughter-in-law, [phone]    Current Level of Care: Hospital Recommended Level of Care: [place] Prior Approval Number:    Date Approved/Denied:   PASRR Number: [id number]  Discharge Plan: SNF    Current Diagnoses: Patient Active Problem List   Diagnosis Date Noted  . Blood loss anemia 05/12/2018  . E. coli UTI 05/10/2018  . Acute respiratory failure with hypoxia ([place]) 05/10/2018  . Hyperlipidemia associated with type 2 diabetes mellitus ([place]) 05/10/2018  . Acute low back pain with sciatica 05/02/2018  . Acute kidney injury superimposed on chronic kidney disease ([place])   . Toxic metabolic encephalopathy   . UTI (urinary tract infection) 04/30/2018  . Essential hypertension 04/29/2018  . Fall 04/29/2018  . Back pain 04/29/2018  . Leukocytosis 04/29/2018  . Hypokalemia 04/29/2018  . CKD (chronic kidney disease), stage IV ([place])   . Diet-controlled diabetes mellitus ([place])   . GERD (gastroesophageal reflux disease)   . Gout   . High cholesterol   . Hypothyroidism   . Iron deficiency anemia   . AV block, Mobitz 2 03/25/2018  . Mobitz type 2 second degree AV block 03/20/2018  . Aortic stenosis 03/20/2018  . Exertional dyspnea 03/19/2018    Orientation RESPIRATION BLADDER Height & Weight     Self, Time, Situation, Place  Normal Incontinent, External catheter Weight: 90.5 kg (199 lb 8.3 oz) Height:  5\' 5"  (165.1 cm)  BEHAVIORAL SYMPTOMS/MOOD NEUROLOGICAL BOWEL NUTRITION STATUS   Continent Diet(Please see DC Summary)  AMBULATORY STATUS COMMUNICATION OF NEEDS Skin   Extensive Assist Verbally Normal                       Personal Care Assistance Level of Assistance  Bathing, Feeding, Dressing Bathing Assistance: Maximum assistance Feeding assistance: Limited assistance Dressing Assistance: Limited assistance     Functional Limitations Info  Sight, Hearing, Speech Sight Info: Adequate Hearing Info: Adequate Speech Info: Adequate    SPECIAL CARE FACTORS FREQUENCY  PT (By licensed PT), OT (By licensed OT)     PT Frequency: 5x/week OT Frequency: 3x/week            Contractures Contractures Info: Not present    Additional Factors Info  Code Status, Allergies, Insulin Sliding Scale Code Status Info: Full Allergies Info: Codeine   Insulin Sliding Scale Info: Every 4 hours       Current Medications (05/14/2018):  This is the current hospital active medication list Current Facility-Administered Medications  Medication Dose Route Frequency Provider Last Rate Last Dose  . 0.9 %  sodium chloride infusion   Intravenous Continuous Allie Bossier, MD 75 mL/hr at 05/14/18 0457    . acetaminophen (TYLENOL) tablet 650 mg  650 mg Oral Q6H Bennie Pierini, MD   650 mg at 05/14/18 1048  . allopurinol (ZYLOPRIM) tablet 200 mg  200 mg Oral Daily Bennie Pierini, MD   200 mg at 05/14/18 1049  . dexamethasone (DECADRON) injection 4 mg  4 mg Intravenous Q6H Allie Bossier,  MD   4 mg at 05/14/18 0618  . gabapentin (NEURONTIN) capsule 100 mg  100 mg Oral TID Allie Bossier, MD   100 mg at 05/14/18 1049  . insulin aspart (novoLOG) injection 0-15 Units  0-15 Units Subcutaneous Q4H Allie Bossier, MD   5 Units at 05/14/18 0800  . levothyroxine (SYNTHROID, LEVOTHROID) tablet 75 mcg  75 mcg Oral QAC breakfast Bennie Pierini, MD   75 mcg at 05/14/18 0800  . methocarbamol (ROBAXIN) tablet 500 mg  500 mg Oral Q8H PRN Hammonds, Sharyn Blitz, MD      . ondansetron  [facility]) tablet 4 mg  4 mg Oral Q6H PRN Bennie Pierini, MD       Or  . ondansetron [facility]) injection 4 mg  4 mg Intravenous Q6H PRN Bennie Pierini, MD      . oxyCODONE (Oxy IR/ROXICODONE) immediate release tablet 5 mg  5 mg Oral Q6H PRN Bennie Pierini, MD   5 mg at 05/14/18 1048  . pantoprazole (PROTONIX) injection 40 mg  40 mg Intravenous Q12H Vena Rua, PA-C   40 mg at 05/14/18 1049  . piperacillin-tazobactam (ZOSYN) IVPB 2.25 g  2.25 g Intravenous Q6H Mancheril, Darnell Level, RPH 100 mL/hr at 05/14/18 1000 2.25 g at 05/14/18 1000  . polyethylene glycol (MIRALAX / GLYCOLAX) packet 17 g  17 g Oral BID Yetta Flock, MD   17 g at 05/14/18 1050  . simvastatin (ZOCOR) tablet 40 mg  40 mg Oral QHS Bennie Pierini, MD   40 mg at 05/13/18 2211  . sodium chloride flush (NS) 0.9 % injection 3 mL  3 mL Intravenous Q12H Bennie Pierini, MD   3 mL at 05/13/18 1053  . [START ON 05/15/2018] vancomycin (VANCOCIN) IVPB 1000 mg/200 mL premix  1,000 mg Intravenous Q48H Karren Cobble, [facility]         Discharge Medications: Please see discharge summary for a list of discharge medications.  Relevant Imaging Results:  Relevant Lab Results:   Additional Information SSn: [place], [place]

## 2018-05-14 NOTE — Progress Notes (Signed)
Progress Note   Subjective  Patient continues to have back pain. No reported bleeding overnight. She did have another bowel movement but not sure what it looked like. MRI spine findings noted.   Objective   Vital signs in last 24 hours: Temp:  [97.3 F (36.3 C)-98.8 F (37.1 C)] 97.3 F (36.3 C) (06/13 0800) Pulse Rate:  [79-89] 81 (06/13 0800) Resp:  [13-21] 15 (06/13 0800) BP: (103-139)/(57-70) 137/67 (06/13 0800) SpO2:  [85 %-100 %] 100 % (06/13 0800) Weight:  [189 lb 6 oz (85.9 kg)-199 lb 8.3 oz (90.5 kg)] 199 lb 8.3 oz (90.5 kg) (06/13 0654)   General:    white female in NAD Heart:  Regular rate and rhythm; Lungs: Respirations even and unlabored, lungs CTA bilaterally Abdomen:  Soft, nontender and nondistended.  Extremities:  Mild LE edema. Neurologic:  Alert and oriented,  grossly normal neurologically. Psych:  Cooperative. Normal mood and affect.  Intake/Output from previous day: 06/12 0701 - 06/13 0700 In: 1371.3 [P.O.:50; I.V.:923; Blood:315; IV Piggyback:83.3] Out: 1 [Stool:1] Intake/Output this shift: No intake/output data recorded.  Lab Results: Recent Labs    05/12/18 1827 05/13/18 0427 05/13/18 1547  WBC 22.6* 21.4*  --   HGB 4.1* 6.9* 8.2*  HCT 12.6* 20.0* 24.2*  PLT 305 290  --    BMET Recent Labs    05/12/18 1827 05/13/18 0545  NA 130* 131*  K 3.9 3.9  CL 90* 96*  CO2 25 20*  GLUCOSE 191* 157*  BUN 158* 157*  CREATININE 3.62* 3.54*  CALCIUM 9.7 9.2   LFT Recent Labs    05/13/18 0427  PROT 5.7*  ALBUMIN 2.4*  AST 22  ALT 11*  ALKPHOS 62  BILITOT 1.1  BILIDIR 0.3  IBILI 0.8   PT/INR Recent Labs    05/13/18 0427  LABPROT 13.2  INR 1.01    Studies/Results: Ct Abdomen Pelvis Wo Contrast  Result Date: 05/13/2018 CLINICAL DATA:  Anemia, unexplained, asymptomatic. Right lower back pain. Weakness. Decreased appetite. EXAM: CT ABDOMEN AND PELVIS WITHOUT CONTRAST TECHNIQUE: Multidetector CT imaging of the abdomen and  pelvis was performed following the standard protocol without IV contrast. COMPARISON:  Pelvis and lumbar spine CT 04/29/2018 FINDINGS: Lower chest: Pacemaker partially included. There are coronary artery calcifications. Small circumferential pericardial effusion. Hepatobiliary: No focal hepatic lesion on noncontrast exam. Mild gallbladder distention containing layering high-density material. No pericholecystic stranding allowing for motion artifact. No biliary dilatation. Pancreas: No ductal dilatation or inflammation. Motion artifact partially obscures evaluation of the head and uncinate process. Spleen: Normal in size.  No focal abnormality on noncontrast exam. Adrenals/Urinary Tract: No adrenal nodule. Moderate hydronephrosis without ureteral dilatation. There is an 8 mm stone in the right renal pelvis. No ureteral calculi. No left hydronephrosis. Nonobstructing 6 mm stone in the upper left kidney. Subcentimeter low-density in the upper left kidney is too small to characterize but may be cyst or angiomyolipoma. Urinary bladder is partially distended without wall thickening or stone. Stomach/Bowel: Small hiatal hernia. Stomach physiologically distended. Mild motion artifact through the upper abdomen limits evaluation of abdominal bowel loops. Allowing for this, no evidence of inflammation, obstruction or bowel wall thickening. Appendix not visualized, surgically absent per history. Moderate diffuse colonic stool burden transverse colonic tortuosity. Rectum is distended with stool measuring 7.9 cm. No definite rectal wall thickening, there is mild perirectal and presacral edema. Vascular/Lymphatic: Aortic atherosclerosis and tortuosity without aneurysm. No enlarged abdominal or pelvic lymph nodes allowing for noncontrast exam motion  artifact in the abdomen. Reproductive: Uterus is atrophic, normal for age.  No adnexal mass. Other: Tiny fat containing umbilical hernia. No free air, free fluid, or intra-abdominal  fluid collection. Musculoskeletal: Degenerative disc disease at L4-L5. There are no acute or suspicious osseous abnormalities. IMPRESSION: 1. Mild right hydronephrosis with a nonobstructing 8 mm stone in the right kidney. No ureteral calculi or ureteral dilatation. Cause of hydronephrosis is not identified. Nonobstructing left renal stone without left hydronephrosis. 2. Moderate colonic stool burden with colonic tortuosity consistent with constipation. There is fecal impaction with rectal distention and perirectal edema. 3. Layering hyperdense gallbladder contents likely combination of stones and sludge. 4.  Aortic Atherosclerosis (ICD10-I70.0). Electronically Signed   By: Jeb Levering M.D.   On: 05/13/2018 04:06   Mr Lumbar Spine Wo Contrast  Result Date: 05/13/2018 CLINICAL DATA:  Initial evaluation for persistent right-sided low back pain. EXAM: MRI LUMBAR SPINE WITHOUT CONTRAST TECHNIQUE: Multiplanar, multisequence MR imaging of the lumbar spine was performed. No intravenous contrast was administered. COMPARISON:  Prior CT from earlier the same day. FINDINGS: Segmentation: Normal segmentation. Lowest well-formed disc labeled the L5-S1 level. Alignment: Mild levoscoliosis of the thoracolumbar spine. Alignment otherwise normal with preservation of the normal lumbar lordosis. No listhesis. Vertebrae: Vertebral body heights maintained without evidence for acute or chronic fracture. Bone marrow signal intensity diffusely heterogeneous and decreased on T1 weighted imaging, suspected be related underlying anemia. No discrete or worrisome osseous lesions. There is abnormal marrow edema centered about the right aspect of the L4-5 interspace, involving both the L4 and L5 vertebral bodies (series 6, image 5). Fluid density present within the intervening L4-5 interspace. Soft tissue edema within the adjacent right psoas muscle. Findings concerning for acute osteomyelitis discitis. Superimposed loculated paraspinous  collection at the posterior right psoas muscle measures 19 x 11 mm (series 6, image 2). Probable phlegmonous changes within the adjacent right L4-5 neural foramen. No discernible epidural abscess or other collection. Soft tissue edema extends into the right posterior paraspinous musculature as well. Mild extension into the left posterior paraspinous soft tissues noted (series 6, image 17). Conus medullaris and cauda equina: Conus extends to the L1-2 level. Conus and cauda equina appear normal. Paraspinal and other soft tissues: Paraspinous changes related to osteomyelitis discitis at L4-5 as above. Paraspinous soft tissues demonstrate no other acute abnormality. Scattered T2 hyperintense cyst noted within the kidneys bilaterally. Few of these cystic lesions demonstrate intrinsic T1 hyperintensity, likely reflecting proteinaceous and/or hemorrhagic cysts, largest of which measures 14 mm at the interpolar right kidney. Paraspinous soft tissues otherwise within normal limits. Disc levels: T11-12: Mild diffuse disc bulge with disc desiccation and reactive endplate changes. Right-sided facet degeneration. No significant stenosis. T12-L1: Mild disc bulge. Superimposed small left paracentral disc protrusion indents the left ventral thecal sac. No significant stenosis. L1-2: Diffuse disc bulge, slightly asymmetric to the right. Mild to moderate facet and ligament flavum hypertrophy. No significant stenosis. L2-3: Diffuse disc bulge with mild to moderate facet hypertrophy. Superimposed broad left subarticular/foraminal disc protrusion, with the protruding disc contacting the exiting left L2 nerve root in the left neural foramen (series 8, image 21). Mild left lateral recess and foraminal stenosis. L3-4: Diffuse disc bulge. Superimposed broad right foraminal/extraforaminal disc protrusion contacts the exiting right L3 nerve root as it courses out of the right neural foramen (series 8, image 26). Mild to moderate facet  hypertrophy. Resultant mild canal with left lateral recess narrowing. Mild left L3 foraminal stenosis. L4-5: Chronic intervertebral disc space narrowing with  diffuse disc bulge and reactive endplate changes. Abnormal fluid density within the right L4-5 interspace with changes suggestive of osteomyelitis discitis. Edema extends into the right pedicles of L4 and L5 with phlegmonous change within the right L4-5 neural foramen. No frank epidural abscess or other collection identified on this noncontrast examination. Mild facet and ligament flavum hypertrophy. Resultant mild canal stenosis. Moderate to severe right with mild left lateral recess narrowing. Severe right with moderate left L4 foraminal stenosis (series 7, image 5). L5-S1: Negative interspace. Moderate right with mild left facet hypertrophy. No significant stenosis. IMPRESSION: 1. Findings concerning for acute osteomyelitis discitis involving the right aspect of the L4-5 interspace as above. Associated paraspinous edema within the adjacent right psoas muscle with superimposed 11 x 19 mm paraspinous fluid collection. Probable phlegmon within the right L4-5 neural foramen without frank epidural abscess or other collection. 2. Superimposed multifactorial degenerative changes at L4-5 with resultant moderate to severe right with moderate left L4 foraminal stenosis. 3. Small left foraminal disc protrusion at L2-3, contacting the exiting left L2 nerve root in the left neural foramen. 4. Shallow right foraminal/extraforaminal disc protrusion at L3-4, contacting the exiting right L3 nerve root as it courses out of the right neural foramen. Electronically Signed   By: Jeannine Boga M.D.   On: 05/13/2018 15:00       Assessment / Plan:   72 y/o female with stage IV CKD and history of iron deficiency, presenting with worsening of chronic anemia. Dark stools reported on DRE however patient had large bowel movement yesterday afternoon which was brown without  blood but FOBT (+). Hgb 4s initially, improved to 8s with transfusion and she is hemodynamically stable. Labs pending from this AM. She is on empiric PPI. She has never had a prior colonoscopy.  Endoscopic evaluation is warranted to further evaluate, she warrants both an EGD and colonoscopy. I have discussed both of these with her and she is agreeable. However, if she is not actively bleeding, recommend improvement of her osteomyelitis / discitis / possible abscess prior to pursuing this. She is on antibiotics and has orders for aspiration of the fluid collection today. It may be a few days before she is ready for endoscopic evaluation. In the interim she should be on a bowel regimen given significant stool burden and narcotic use, will order Miralax.  We will follow, call with questions or concerns for bleeding in the interim.  Lancaster Cellar, MD [facility]

## 2018-05-14 NOTE — Consult Note (Signed)
Chief Complaint: Patient was seen in consultation today for spinal abscess.  Referring Physician(s): Allie Bossier  Supervising Physician: Luanne Bras  Patient Status: [facility] - In-pt  History of Present Illness: Erika Fry is a 72 y.o. female with a past medical history of hypertension, high cholesterol, diabetes mellitus, CKD stage IV, GERD, hypothyroidism, iron deficiency anemia, and arthritis. She presented to ED 05/12/2018 with complaint of low back/hip pain. She was admitted and found to have a spinal abscess.  MRI lumbar spine 05/13/2018: 1. Findings concerning for acute osteomyelitis discitis involving the right aspect of the L4-5 interspace as above. Associated paraspinous edema within the adjacent right psoas muscle with superimposed 11 x 19 mm paraspinous fluid collection. Probable phlegmon within the right L4-5 neural foramen without frank epidural abscess or other collection. 2. Superimposed multifactorial degenerative changes at L4-5 with resultant moderate to severe right with moderate left L4 foraminal stenosis. 3. Small left foraminal disc protrusion at L2-3, contacting the exiting left L2 nerve root in the left neural foramen. 4. Shallow right foraminal/extraforaminal disc protrusion at L3-4, contacting the exiting right L3 nerve root as it courses out of the right neural foramen.  IR requested by Dr. Sherral Hammers for possible image-guided lumbar 4-lumbar 5 disc aspiration. Patient awake and alert sitting in bed with no complaints at this time. Denies fever, chills, chest pain, dyspnea, abdominal pain, or dizziness.  Past Medical History:  Diagnosis Date  . Arthritis    "knees, thumbs" (03/25/2018)  . CKD (chronic kidney disease), stage IV ([place])   . Diet-controlled diabetes mellitus ([place])   . GERD (gastroesophageal reflux disease)   . Gout    "on daily RX" (03/25/2018)  . Heart murmur   . High cholesterol   . Hypertension   . Hypothyroidism   . Iron  deficiency anemia    "had to get an iron infusion"  . Migraine    "used to have them growing up" (03/25/2018)  . Presence of permanent cardiac pacemaker 03/25/2018    Past Surgical History:  Procedure Laterality Date  . APPENDECTOMY    . INSERT / REPLACE / REMOVE PACEMAKER  03/25/2018  . KNEE ARTHROSCOPY Bilateral   . PACEMAKER IMPLANT N/A 03/25/2018   Procedure: PACEMAKER IMPLANT;  Surgeon: Evans Lance, MD;  Location: [place] [facility];  Service: Cardiovascular;  Laterality: N/A;  . PACEMAKER PLACEMENT Left 03/2018  . RIGHT HEART CATH N/A 03/20/2018   Procedure: RIGHT HEART CATH;  Surgeon: Nigel Mormon, MD;  Location: [place] [facility];  Service: Cardiovascular;  Laterality: N/A;  . TEE WITHOUT CARDIOVERSION N/A 03/20/2018   Procedure: TRANSESOPHAGEAL ECHOCARDIOGRAM (TEE);  Surgeon: Nigel Mormon, MD;  Location: [facility] ENDOSCOPY;  Service: Cardiovascular;  Laterality: N/A;  . TONSILLECTOMY      Allergies: Codeine  Medications: Prior to Admission medications   Medication Sig Start Date End Date Taking? Authorizing Provider  acetaminophen (TYLENOL) 325 MG tablet Take 2 tablets (650 mg total) by mouth every 6 (six) hours as needed for mild pain. 05/05/18  Yes Hongalgi, Lenis Dickinson, MD  allopurinol (ZYLOPRIM) 100 MG tablet Take 2 tablets (200 mg total) by mouth daily. 05/05/18  Yes Hongalgi, Lenis Dickinson, MD  amLODipine (NORVASC) 10 MG tablet Take 10 mg by mouth daily.   Yes [provider]  aspirin EC 81 MG tablet Take 81 mg by mouth daily.   Yes [provider]  Biotin (BIOTIN FORTE) 3 MG TABS Take 3 mg by mouth daily.   Yes  [provider]  cetirizine (ZYRTEC) 10 MG tablet Take 10 mg by mouth at bedtime.    Yes [provider]  fluticasone (FLONASE) 50 MCG/ACT nasal spray Place 2 sprays into both nostrils daily as needed for allergies or rhinitis.   Yes [provider]  folic acid (FOLVITE) 1 MG tablet Take 1 mg by mouth daily.   Yes  [provider]  furosemide (LASIX) 80 MG tablet Take 80 mg by mouth 2 (two) times daily.    Yes [provider]  hydrALAZINE (APRESOLINE) 50 MG tablet Take 50 mg by mouth 3 (three) times daily.   Yes [provider]  insulin aspart (NOVOLOG) 100 UNIT/ML injection Inject 0-15 Units into the skin 3 (three) times daily with meals. CBG < 70: implement hypoglycemia protocol CBG 70 - 120: 0 units CBG 121 - 150: 2 units CBG 151 - 200: 3 units CBG 201 - 250: 5 units CBG 251 - 300: 8 units CBG 301 - 350: 11 units CBG 351 - 400: 15 units CBG > 400: call MD. 05/05/18  Yes Hongalgi, Lenis Dickinson, MD  insulin aspart (NOVOLOG) 100 UNIT/ML injection Inject 0-5 Units into the skin at bedtime. Correction coverage: HS scale CBG < 70: implement hypoglycemia protocol CBG 70 - 120: 0 units CBG 121 - 150: 0 units CBG 151 - 200: 0 units CBG 201 - 250: 2 units CBG 251 - 300: 3 units CBG 301 - 350: 4 units CBG 351 - 400: 5 units CBG > 400: call MD. 05/05/18  Yes Hongalgi, Lenis Dickinson, MD  insulin glargine (LANTUS) 100 UNIT/ML injection Inject 0.05 mLs (5 Units total) into the skin daily. Patient taking differently: Inject 5 Units into the skin at bedtime.  05/06/18  Yes Hongalgi, Lenis Dickinson, MD  levothyroxine (SYNTHROID, LEVOTHROID) 75 MCG tablet Take 75 mcg by mouth daily before breakfast.   Yes [provider]  Multiple Vitamin (MULTI-VITAMIN PO) Take 1 tablet by mouth daily.   Yes [provider]  polyethylene glycol (MIRALAX / GLYCOLAX) packet Take 17 g by mouth 2 (two) times daily. 05/05/18  Yes Hongalgi, Lenis Dickinson, MD  ranitidine (ZANTAC) 150 MG tablet Take 150 mg by mouth daily.    Yes [provider]  senna (SENOKOT) 8.6 MG TABS tablet Take 1 tablet (8.6 mg total) by mouth daily. 05/06/18  Yes Hongalgi, Lenis Dickinson, MD  simvastatin (ZOCOR) 40 MG tablet Take 40 mg by mouth at bedtime.    Yes [provider]  vitamin B-12 (CYANOCOBALAMIN) 1000 MCG tablet Take 1,000 mcg  by mouth daily.   Yes [provider]  bisacodyl (DULCOLAX) 10 MG suppository Place 1 suppository (10 mg total) rectally daily as needed for moderate constipation. Patient not taking: Reported on 05/12/2018 05/05/18   Modena Jansky, MD  methylPREDNISolone (MEDROL DOSEPAK) 4 MG TBPK tablet Patient is currently on day 3 of 5 days (started 05/03/2018).  Complete 5 days on 05/07/2018 as per Dosepak instructions. Patient not taking: Reported on 05/12/2018 05/05/18   Modena Jansky, MD  oxyCODONE-acetaminophen (PERCOCET) 7.5-325 MG tablet Take 1 tablet by mouth every 6 (six) hours. 05/12/18 05/20/18  [provider]  oxyCODONE-acetaminophen (PERCOCET/ROXICET) 5-325 MG tablet Take 1 tablet by mouth every 12 (twelve) hours as needed for moderate pain. Patient not taking: Reported on 05/12/2018 05/05/18   Modena Jansky, MD     Family History  Problem Relation Age of Onset  . Hypertension Mother   . Diabetes Mellitus II Father   .  Heart disease Father   . Gastric cancer Brother   . Diabetes Mellitus II Brother     Social History   Socioeconomic History  . Marital status: Widowed    Spouse name: Not on file  . Number of children: Not on file  . Years of education: Not on file  . Highest education level: Not on file  Occupational History  . Not on file  Social Needs  . Financial resource strain: Not on file  . Food insecurity:    Worry: Not on file    Inability: Not on file  . Transportation needs:    Medical: Not on file    Non-medical: Not on file  Tobacco Use  . Smoking status: Never Smoker  . Smokeless tobacco: Never Used  Substance and Sexual Activity  . Alcohol use: Not Currently  . Drug use: Never  . Sexual activity: Not Currently  Lifestyle  . Physical activity:    Days per week: Not on file    Minutes per session: Not on file  . Stress: Not on file  Relationships  . Social connections:    Talks on phone: Not on file    Gets together: Not on file     Attends religious service: Not on file    Active member of club or organization: Not on file    Attends meetings of clubs or organizations: Not on file    Relationship status: Not on file  Other Topics Concern  . Not on file  Social History Narrative  . Not on file     Review of Systems: A 12 point ROS discussed and pertinent positives are indicated in the HPI above.  All other systems are negative.  Review of Systems  Constitutional: Negative for chills and fever.  Respiratory: Negative for shortness of breath and wheezing.   Cardiovascular: Negative for chest pain and palpitations.  Gastrointestinal: Negative for abdominal pain.  Neurological: Negative for dizziness.  Psychiatric/Behavioral: Negative for behavioral problems and confusion.    Vital Signs: BP 137/67 (BP Location: Left Arm)   Pulse 81   Temp (!) 97.3 F (36.3 C) (Oral)   Resp 15   Ht 5\' 5"  (1.651 m)   Wt 199 lb 8.3 oz (90.5 kg)   SpO2 100%   BMI 33.20 kg/m   Physical Exam  Constitutional: She is oriented to person, place, and time. She appears well-developed and well-nourished. No distress.  Cardiovascular: Normal rate, regular rhythm and normal heart sounds.  No murmur heard. Pulmonary/Chest: Effort normal and breath sounds normal. No respiratory distress. She has no wheezes.  Neurological: She is alert and oriented to person, place, and time.  Skin: Skin is warm and dry.  Psychiatric: She has a normal mood and affect. Her behavior is normal. Judgment and thought content normal.  Nursing note and vitals reviewed.    MD Evaluation Airway: WNL Heart: WNL Abdomen: WNL Chest/ Lungs: WNL ASA  Classification: 3 Mallampati/Airway Score: Two   Imaging: Ct Abdomen Pelvis Wo Contrast  Result Date: 05/13/2018 CLINICAL DATA:  Anemia, unexplained, asymptomatic. Right lower back pain. Weakness. Decreased appetite. EXAM: CT ABDOMEN AND PELVIS WITHOUT CONTRAST TECHNIQUE: Multidetector CT imaging of the  abdomen and pelvis was performed following the standard protocol without IV contrast. COMPARISON:  Pelvis and lumbar spine CT 04/29/2018 FINDINGS: Lower chest: Pacemaker partially included. There are coronary artery calcifications. Small circumferential pericardial effusion. Hepatobiliary: No focal hepatic lesion on noncontrast exam. Mild gallbladder distention containing layering high-density material. No pericholecystic  stranding allowing for motion artifact. No biliary dilatation. Pancreas: No ductal dilatation or inflammation. Motion artifact partially obscures evaluation of the head and uncinate process. Spleen: Normal in size.  No focal abnormality on noncontrast exam. Adrenals/Urinary Tract: No adrenal nodule. Moderate hydronephrosis without ureteral dilatation. There is an 8 mm stone in the right renal pelvis. No ureteral calculi. No left hydronephrosis. Nonobstructing 6 mm stone in the upper left kidney. Subcentimeter low-density in the upper left kidney is too small to characterize but may be cyst or angiomyolipoma. Urinary bladder is partially distended without wall thickening or stone. Stomach/Bowel: Small hiatal hernia. Stomach physiologically distended. Mild motion artifact through the upper abdomen limits evaluation of abdominal bowel loops. Allowing for this, no evidence of inflammation, obstruction or bowel wall thickening. Appendix not visualized, surgically absent per history. Moderate diffuse colonic stool burden transverse colonic tortuosity. Rectum is distended with stool measuring 7.9 cm. No definite rectal wall thickening, there is mild perirectal and presacral edema. Vascular/Lymphatic: Aortic atherosclerosis and tortuosity without aneurysm. No enlarged abdominal or pelvic lymph nodes allowing for noncontrast exam motion artifact in the abdomen. Reproductive: Uterus is atrophic, normal for age.  No adnexal mass. Other: Tiny fat containing umbilical hernia. No free air, free fluid, or  intra-abdominal fluid collection. Musculoskeletal: Degenerative disc disease at L4-L5. There are no acute or suspicious osseous abnormalities. IMPRESSION: 1. Mild right hydronephrosis with a nonobstructing 8 mm stone in the right kidney. No ureteral calculi or ureteral dilatation. Cause of hydronephrosis is not identified. Nonobstructing left renal stone without left hydronephrosis. 2. Moderate colonic stool burden with colonic tortuosity consistent with constipation. There is fecal impaction with rectal distention and perirectal edema. 3. Layering hyperdense gallbladder contents likely combination of stones and sludge. 4.  Aortic Atherosclerosis (ICD10-I70.0). Electronically Signed   By: Jeb Levering M.D.   On: 05/13/2018 04:06   Ct Lumbar Spine Wo Contrast  Result Date: 04/29/2018 CLINICAL DATA:  Right hip pain.  Back pain. EXAM: CT LUMBAR SPINE WITHOUT CONTRAST TECHNIQUE: Multidetector CT imaging of the lumbar spine was performed without intravenous contrast administration. Multiplanar CT image reconstructions were also generated. COMPARISON:  None. FINDINGS: Segmentation: 5 lumbar type vertebrae. Alignment: Normal. Vertebrae: No acute fracture or focal pathologic process. Paraspinal and other soft tissues: Negative. Other: Mild osteoarthritis of bilateral sacroiliac joints. Abdominal aortic atherosclerosis. Disc levels: Mild degenerative disc disease with disc height loss at T11-12 and to lesser extent L1-2, L4-5. T12-L1: Mild broad-based disc bulge. Mild bilateral facet arthropathy. No foraminal stenosis. L1-L2: Broad-based disc bulge. Mild bilateral facet arthropathy. No foraminal stenosis. L2-L3: Broad-based disc bulge with a small left foraminal disc protrusion contacting the left L2 nerve root. Mild bilateral facet arthropathy. No foraminal stenosis. L3-L4: Broad-based disc bulge eccentric towards the left. Moderate bilateral facet arthropathy. Mild spinal stenosis. No foraminal stenosis. L4-L5:  Broad-based disc bulge. Moderate-severe bilateral facet arthropathy. Mild spinal stenosis. No significant foraminal stenosis. L5-S1: Small right paracentral disc protrusion. No foraminal stenosis. Mild bilateral facet arthropathy. IMPRESSION: 1.  No acute osseous injury of the lumbar spine. 2. Diffuse lumbar spine spondylosis as described above. Electronically Signed   By: Kathreen Devoid   On: 04/29/2018 17:57   Ct Pelvis Wo Contrast  Result Date: 04/29/2018 CLINICAL DATA:  Pain post fall EXAM: CT PELVIS WITHOUT CONTRAST TECHNIQUE: Multidetector CT imaging of the pelvis was performed following the standard protocol without intravenous contrast. COMPARISON:  None. FINDINGS: Urinary Tract:  No abnormality visualized. Bowel:  Unremarkable visualized pelvic bowel loops. Vascular/Lymphatic: Patchy iliofemoral arterial  calcifications. No adenopathy. Reproductive:  No mass or other significant abnormality Other: No ascites.  No free air. Musculoskeletal: Degenerative disc disease L4-5. L5 is a transitional segment with left-sided assimilation joint with the pelvis. Negative for fracture or dislocation. Regional soft tissues unremarkable. IMPRESSION: 1. Negative for fracture, dislocation, or other acute finding. 2. Degenerative disc disease L4-5. Electronically Signed   By: Lucrezia Europe M.D.   On: 04/29/2018 17:53   Mr Lumbar Spine Wo Contrast  Result Date: 05/13/2018 CLINICAL DATA:  Initial evaluation for persistent right-sided low back pain. EXAM: MRI LUMBAR SPINE WITHOUT CONTRAST TECHNIQUE: Multiplanar, multisequence MR imaging of the lumbar spine was performed. No intravenous contrast was administered. COMPARISON:  Prior CT from earlier the same day. FINDINGS: Segmentation: Normal segmentation. Lowest well-formed disc labeled the L5-S1 level. Alignment: Mild levoscoliosis of the thoracolumbar spine. Alignment otherwise normal with preservation of the normal lumbar lordosis. No listhesis. Vertebrae: Vertebral body  heights maintained without evidence for acute or chronic fracture. Bone marrow signal intensity diffusely heterogeneous and decreased on T1 weighted imaging, suspected be related underlying anemia. No discrete or worrisome osseous lesions. There is abnormal marrow edema centered about the right aspect of the L4-5 interspace, involving both the L4 and L5 vertebral bodies (series 6, image 5). Fluid density present within the intervening L4-5 interspace. Soft tissue edema within the adjacent right psoas muscle. Findings concerning for acute osteomyelitis discitis. Superimposed loculated paraspinous collection at the posterior right psoas muscle measures 19 x 11 mm (series 6, image 2). Probable phlegmonous changes within the adjacent right L4-5 neural foramen. No discernible epidural abscess or other collection. Soft tissue edema extends into the right posterior paraspinous musculature as well. Mild extension into the left posterior paraspinous soft tissues noted (series 6, image 17). Conus medullaris and cauda equina: Conus extends to the L1-2 level. Conus and cauda equina appear normal. Paraspinal and other soft tissues: Paraspinous changes related to osteomyelitis discitis at L4-5 as above. Paraspinous soft tissues demonstrate no other acute abnormality. Scattered T2 hyperintense cyst noted within the kidneys bilaterally. Few of these cystic lesions demonstrate intrinsic T1 hyperintensity, likely reflecting proteinaceous and/or hemorrhagic cysts, largest of which measures 14 mm at the interpolar right kidney. Paraspinous soft tissues otherwise within normal limits. Disc levels: T11-12: Mild diffuse disc bulge with disc desiccation and reactive endplate changes. Right-sided facet degeneration. No significant stenosis. T12-L1: Mild disc bulge. Superimposed small left paracentral disc protrusion indents the left ventral thecal sac. No significant stenosis. L1-2: Diffuse disc bulge, slightly asymmetric to the right. Mild  to moderate facet and ligament flavum hypertrophy. No significant stenosis. L2-3: Diffuse disc bulge with mild to moderate facet hypertrophy. Superimposed broad left subarticular/foraminal disc protrusion, with the protruding disc contacting the exiting left L2 nerve root in the left neural foramen (series 8, image 21). Mild left lateral recess and foraminal stenosis. L3-4: Diffuse disc bulge. Superimposed broad right foraminal/extraforaminal disc protrusion contacts the exiting right L3 nerve root as it courses out of the right neural foramen (series 8, image 26). Mild to moderate facet hypertrophy. Resultant mild canal with left lateral recess narrowing. Mild left L3 foraminal stenosis. L4-5: Chronic intervertebral disc space narrowing with diffuse disc bulge and reactive endplate changes. Abnormal fluid density within the right L4-5 interspace with changes suggestive of osteomyelitis discitis. Edema extends into the right pedicles of L4 and L5 with phlegmonous change within the right L4-5 neural foramen. No frank epidural abscess or other collection identified on this noncontrast examination. Mild facet and ligament flavum  hypertrophy. Resultant mild canal stenosis. Moderate to severe right with mild left lateral recess narrowing. Severe right with moderate left L4 foraminal stenosis (series 7, image 5). L5-S1: Negative interspace. Moderate right with mild left facet hypertrophy. No significant stenosis. IMPRESSION: 1. Findings concerning for acute osteomyelitis discitis involving the right aspect of the L4-5 interspace as above. Associated paraspinous edema within the adjacent right psoas muscle with superimposed 11 x 19 mm paraspinous fluid collection. Probable phlegmon within the right L4-5 neural foramen without frank epidural abscess or other collection. 2. Superimposed multifactorial degenerative changes at L4-5 with resultant moderate to severe right with moderate left L4 foraminal stenosis. 3. Small left  foraminal disc protrusion at L2-3, contacting the exiting left L2 nerve root in the left neural foramen. 4. Shallow right foraminal/extraforaminal disc protrusion at L3-4, contacting the exiting right L3 nerve root as it courses out of the right neural foramen. Electronically Signed   By: Jeannine Boga M.D.   On: 05/13/2018 15:00    Labs:  CBC: Recent Labs    05/04/18 0703 05/05/18 0329 05/12/18 1827 05/13/18 0427 05/13/18 1547  WBC 14.7* 20.9* 22.6* 21.4*  --   HGB 8.2* 8.3* 4.1* 6.9* 8.2*  HCT 24.1* 24.5* 12.6* 20.0* 24.2*  PLT 177 199 305 290  --     COAGS: Recent Labs    05/13/18 0427  INR 1.01  APTT 25    BMP: Recent Labs    05/05/18 0329 05/12/18 1827 05/13/18 0545 05/14/18 0636  NA 128* 130* 131* 136  K 4.9 3.9 3.9 4.4  CL 93* 90* 96* 97*  CO2 23 25 20* 20*  GLUCOSE 265* 191* 157* 214*  BUN 116* 158* 157* 131*  CALCIUM 9.5 9.7 9.2 9.6  CREATININE 3.77* 3.62* 3.54* 3.31*  GFRNONAA 11* 11* 12* 13*  GFRAA 13* 13* 14* 15*    LIVER FUNCTION TESTS: Recent Labs    10/31/17 1050 05/13/18 0427  BILITOT  --  1.1  AST  --  22  ALT  --  11*  ALKPHOS  --  62  PROT  --  5.7*  ALBUMIN 3.5 2.4*    TUMOR MARKERS: No results for input(s): AFPTM, CEA, CA199, CHROMGRNA in the last 8760 hours.  Assessment and Plan:  Spinal abscess. Plan for image-guided lumbar 4-lumbar 5 disc aspiration today with Dr. Estanislado Pandy. Patient is NPO. She does not take blood thinners. INR pending.  Risks and benefits discussed with the patient including, but not limited to bleeding, infection, damage to adjacent structures or low yield requiring additional tests. All of the patient's questions were answered, patient is agreeable to proceed. Consent signed and in chart.  Thank you for this interesting consult.  I greatly enjoyed meeting Erika Fry and look forward to participating in their care.  A copy of this report was sent to the requesting provider on this  date.  Electronically Signed: Earley Abide, PA-C 05/14/2018, 11:08 AM   I spent a total of 20 Minutes in face to face in clinical consultation, greater than 50% of which was counseling/coordinating care for spinal abscess.

## 2018-05-14 NOTE — Progress Notes (Signed)
PROGRESS NOTE    Erika Fry  PTW:[id number] DOB: Apr 10, 1946 DOA: 05/12/2018 PCP: [name], MD   Brief Narrative:  72 y.o. WF PMHx  second-degree AV block S/P permanent pacemaker placement on 04/07/2018, HTN, CKD stage IV, Hypothyroidism, Diabetes type 2, Iron Deficiency anemia   Presents to the ED [facility],  she was discharged to home on 6/4 with fatigue, weakness, poor appetite, and nausea.  Patient was admitted from 5/29 through 6/4 after mechanical fall which resulted in severe back pain and difficulty ambulating.  MRI was not obtained due to recent placement of PPM, and CT myelogram close contra indicated due to the patient's baseline CKD.  She was started on a steroid Dosepak and discharged to SNF.  Although she continues to have back pain, she is also began to experience the aforementioned fatigue and weakness.  She states that she has been constipated, and is not sure of the color of her stools.  She denies the presence of frank blood in her stools.  She states that she has been nauseated without emesis.  No hemoptysis, no hematuria, no significant bruising, no new rashes.  She denies abdominal pain.  She is not on any blood thinners or antiplatelet agents.  She denies recent fever, chills, cough, shortness of breath, diarrhea, dysuria.   ED Course: In the ED, the patient is afebrile, mildly hypertensive at 90/55 and saturating comfortably on room air.  Labs are notable for WBC 22.6 (was 20.9 on 6/4), hemoglobin 4.1 (was 8.3 on 6/4), platelets 305, NA 130, BUN 158, creatinine 3.62.  Patient received 500 cc normal saline bolus, Protonix 80 mg IV x1 and 2 units of packed red cells while in the ED.  Critical care was called and felt the patient was appropriate for admission to the hospitalist service.  Gastroneurology was also called, awaiting recommendations.    Subjective: 6/13 A/O x4, negative CP, negative abdominal pain, negative S OB.  Positive continued back pain but  significantly decreased from 6/14.  Able to now move her RLE.      Assessment & Plan:   Active Problems:   Blood loss anemia  Acute blood loss anemia/upper GI bleed -Patient seen by [facility] on 6/12 recommend EGD. -Patient has never had screening colonoscopy.  Will require both screening colonoscopy and EGD when GI comfortable with patient stability. -Protonix 40 mg BID  - Hold antiplatelet/anticoagulants - Transfuse: See CHF  -Hemolysis labs pending, anemia panel pending Recent Labs  Lab 05/12/18 1827 05/13/18 0427 05/13/18 1547 05/14/18 1151  HGB 4.1* 6.9* 8.2* 7.5*    Leukocytosis -Leukocytosis most likely explained by discitis/RIGHT psoas abscess.  Awaiting findings of aspiration which has been delayed until 6/14.  Per IR will not be able to obtain fluid from abscess (insignificant amount) but will obtain disc fluid/biopsy  -Afebrile last 24 hours.     CKD stage IV (baseline Cr= 3.0-4.0) Recent Labs  Lab 05/12/18 1827 05/13/18 0545 05/14/18 0636  CREATININE 3.62* 3.54* 3.31*  -Baseline - Avoid nephrotoxins - Monitor closely   Hypovolemic hyponatremia - Normal saline 92ml/hr Recent Labs  Lab 05/12/18 1827 05/13/18 0545 05/14/18 0636  NA 130* 131* 136  -Resolved  CHF - Echocardiogram 03/20/2018: Hyperdynamic LV with severe basal septal hypertrophy and LVOT obstruction. -Strict in and out - Daily weight - Transfuse hemoglobin<8 -6/11 transfused 2 units PRBC  -6/12 transfuse 1 unit PRBC -6/13 transfuse 1 unit PRBC   Second degree AV block, Mobitz II - s/p PPM placement -  Monitor on telemetry   Low back pain, subsequent encounter/RIGHT LOWER extremity sciatica -Unable to obtain MRI secondary to pacer - 6/12 start Gabapentin at 100 mg TID -Robaxin 500 mg TID -Decadron 10 mg x 1 -Decadron 4 mg QID  Acute discitis/osteomyelitis -L-spine MRI concerning for acute osteomyelitis/discitis in addition phlegmon vs abscess - Begin empiric antibiotics:  Vancomycin + Zosyn - Decadron - 6/13 IR unable to drain RIGHT psoas muscle paraspinous fluid collection, however have arranged for neurosurgery to drain L-spine disc fluid/biopsy.  Patient bumped from schedule today rescheduled for 6/14   Lumbar nerve root impingement -See low back pain -See acute discitis   HypoThyroidism -Synthroid-75 mcg daily  Diabetes type 2 controlled with complication - 4/43 hemoglobin A1c= 6.3 -6/13 increase resistant SSI       DVT prophylaxis: SCD Code Status: Full Family Communication: None Disposition Plan: TBD   Consultants:  [facility]    Procedures/Significant Events:  6/12 L-spine MRI:-Findings concerning for acute osteomyelitis discitis involving the right aspect of the L4-5  -paraspinous edema within the adjacent right psoas muscle with superimposed 11 x 19 mm paraspinous fluid collection. Probable phlegmon within the right L4-5 neural foramen without frank epidural abscess or other collection. - Superimposed multifactorial degenerative changes at L4-5 with resultant moderate to severe right with moderate left L4 foraminal stenosis. - Small left foraminal disc protrusion at L2-3, contacting the exiting left L2 nerve root in the left neural foramen. -Shallow right foraminal/extraforaminal disc protrusion at L3-4,contacting the exiting right L3 nerve root as it courses out of the right neural foramen.      I have personally reviewed and interpreted all radiology studies and my findings are as above.  VENTILATOR SETTINGS:    Cultures      Antimicrobials: Anti-infectives (From admission, onward)   Start     Stop   05/14/18 0300  piperacillin-tazobactam (ZOSYN) IVPB 2.25 g         05/13/18 2000  vancomycin (VANCOCIN) 1,750 mg in sodium chloride 0.9 % 500 mL IVPB         05/13/18 2000  piperacillin-tazobactam (ZOSYN) IVPB 3.375 g             Devices    LINES / TUBES:      Continuous Infusions: . sodium chloride  75 mL/hr at 05/14/18 0457  . piperacillin-tazobactam (ZOSYN)  IV Stopped (05/14/18 0345)     Objective: Vitals:   05/14/18 0410 05/14/18 0615 05/14/18 0654 05/14/18 0800  BP:  134/67  137/67  Pulse:    81  Resp:  19  15  Temp: 97.6 F (36.4 C)   (!) 97.3 F (36.3 C)  TempSrc:    Oral  SpO2:    100%  Weight:   199 lb 8.3 oz (90.5 kg)   Height:        Intake/Output Summary (Last 24 hours) at 05/14/2018 0938 Last data filed at 05/14/2018 0541 Gross per 24 hour  Intake 1371.33 ml  Output 1 ml  Net 1370.33 ml   Filed Weights   05/13/18 1252 05/14/18 0654  Weight: 189 lb 6 oz (85.9 kg) 199 lb 8.3 oz (90.5 kg)    Physical Exam:  General: A/O x4, No acute respiratory distress Lungs: Clear to auscultation bilaterally without wheezes or crackles Cardiovascular: Regular rate and rhythm without murmur gallop or rub normal S1 and S2 Abdomen: negative abdominal pain, nondistended, positive soft, bowel sounds, no rebound, no ascites, no appreciable mass Extremities: No significant cyanosis, clubbing, or  edema bilateral lower extremities Skin: Negative rashes, lesions, ulcers Psychiatric:  Negative depression, negative anxiety, negative fatigue, negative mania  Central nervous system:  Cranial nerves II through XII intact, tongue/uvula midline, all extremities muscle strength 5/5 (patient able to lift LLE off bed without severe pain compared to yesterday), sensation intact throughout,  negative dysarthria, negative expressive aphasia, negative receptive aphasia.  Pain to palpation right lateral hip  .     Data Reviewed: Care during the described time interval was provided by me .  I have reviewed this patient's available data, including medical history, events of note, physical examination, and all test results as part of my evaluation.   CBC: Recent Labs  Lab 05/12/18 1827 05/13/18 0427 05/13/18 1547  WBC 22.6* 21.4*  --   NEUTROABS 19.2*  --   --   HGB 4.1* 6.9* 8.2*  HCT  12.6* 20.0* 24.2*  MCV 90.0 88.1  --   PLT 305 290  --    Basic Metabolic Panel: Recent Labs  Lab 05/12/18 1827 05/13/18 0427 05/13/18 0545 05/14/18 0636  NA 130*  --  131* 136  K 3.9  --  3.9 4.4  CL 90*  --  96* 97*  CO2 25  --  20* 20*  GLUCOSE 191*  --  157* 214*  BUN 158*  --  157* 131*  CREATININE 3.62*  --  3.54* 3.31*  CALCIUM 9.7  --  9.2 9.6  MG  --  2.2  --  2.4  PHOS  --  7.0*  --   --    GFR: Estimated Creatinine Clearance: 16.6 mL/min (A) (by C-G formula based on SCr of 3.31 mg/dL (H)). Liver Function Tests: Recent Labs  Lab 05/13/18 0427  AST 22  ALT 11*  ALKPHOS 62  BILITOT 1.1  PROT 5.7*  ALBUMIN 2.4*   No results for input(s): LIPASE, AMYLASE in the last 168 hours. No results for input(s): AMMONIA in the last 168 hours. Coagulation Profile: Recent Labs  Lab 05/13/18 0427  INR 1.01   Cardiac Enzymes: No results for input(s): CKTOTAL, CKMB, CKMBINDEX, TROPONINI in the last 168 hours. BNP (last 3 results) No results for input(s): PROBNP in the last 8760 hours. HbA1C: No results for input(s): HGBA1C in the last 72 hours. CBG: Recent Labs  Lab 05/13/18 1648 05/13/18 2010 05/14/18 0051 05/14/18 0405 05/14/18 0746  GLUCAP 160* 135* 177* 185* 223*   Lipid Profile: No results for input(s): CHOL, HDL, LDLCALC, TRIG, CHOLHDL, LDLDIRECT in the last 72 hours. Thyroid Function Tests: No results for input(s): TSH, T4TOTAL, FREET4, T3FREE, THYROIDAB in the last 72 hours. Anemia Panel: Recent Labs    05/13/18 0427  VITAMINB12 5,448*  FERRITIN 278  TIBC 304  IRON 91  RETICCTPCT 4.9*   Urine analysis:    Component Value Date/Time   COLORURINE YELLOW 05/13/2018 0140   APPEARANCEUR HAZY (A) 05/13/2018 0140   LABSPEC 1.013 05/13/2018 0140   PHURINE 5.0 05/13/2018 0140   GLUCOSEU NEGATIVE 05/13/2018 0140   HGBUR SMALL (A) 05/13/2018 0140   BILIRUBINUR NEGATIVE 05/13/2018 0140   KETONESUR NEGATIVE 05/13/2018 0140   PROTEINUR NEGATIVE  05/13/2018 0140   NITRITE NEGATIVE 05/13/2018 0140   LEUKOCYTESUR SMALL (A) 05/13/2018 0140   Sepsis Labs: @LABRCNTIP (procalcitonin:4,lacticidven:4)  )No results found for this or any previous visit (from the past 240 hour(s)).       Radiology Studies: Ct Abdomen Pelvis Wo Contrast  Result Date: 05/13/2018 CLINICAL DATA:  Anemia, unexplained, asymptomatic. Right lower back  pain. Weakness. Decreased appetite. EXAM: CT ABDOMEN AND PELVIS WITHOUT CONTRAST TECHNIQUE: Multidetector CT imaging of the abdomen and pelvis was performed following the standard protocol without IV contrast. COMPARISON:  Pelvis and lumbar spine CT 04/29/2018 FINDINGS: Lower chest: Pacemaker partially included. There are coronary artery calcifications. Small circumferential pericardial effusion. Hepatobiliary: No focal hepatic lesion on noncontrast exam. Mild gallbladder distention containing layering high-density material. No pericholecystic stranding allowing for motion artifact. No biliary dilatation. Pancreas: No ductal dilatation or inflammation. Motion artifact partially obscures evaluation of the head and uncinate process. Spleen: Normal in size.  No focal abnormality on noncontrast exam. Adrenals/Urinary Tract: No adrenal nodule. Moderate hydronephrosis without ureteral dilatation. There is an 8 mm stone in the right renal pelvis. No ureteral calculi. No left hydronephrosis. Nonobstructing 6 mm stone in the upper left kidney. Subcentimeter low-density in the upper left kidney is too small to characterize but may be cyst or angiomyolipoma. Urinary bladder is partially distended without wall thickening or stone. Stomach/Bowel: Small hiatal hernia. Stomach physiologically distended. Mild motion artifact through the upper abdomen limits evaluation of abdominal bowel loops. Allowing for this, no evidence of inflammation, obstruction or bowel wall thickening. Appendix not visualized, surgically absent per history. Moderate  diffuse colonic stool burden transverse colonic tortuosity. Rectum is distended with stool measuring 7.9 cm. No definite rectal wall thickening, there is mild perirectal and presacral edema. Vascular/Lymphatic: Aortic atherosclerosis and tortuosity without aneurysm. No enlarged abdominal or pelvic lymph nodes allowing for noncontrast exam motion artifact in the abdomen. Reproductive: Uterus is atrophic, normal for age.  No adnexal mass. Other: Tiny fat containing umbilical hernia. No free air, free fluid, or intra-abdominal fluid collection. Musculoskeletal: Degenerative disc disease at L4-L5. There are no acute or suspicious osseous abnormalities. IMPRESSION: 1. Mild right hydronephrosis with a nonobstructing 8 mm stone in the right kidney. No ureteral calculi or ureteral dilatation. Cause of hydronephrosis is not identified. Nonobstructing left renal stone without left hydronephrosis. 2. Moderate colonic stool burden with colonic tortuosity consistent with constipation. There is fecal impaction with rectal distention and perirectal edema. 3. Layering hyperdense gallbladder contents likely combination of stones and sludge. 4.  Aortic Atherosclerosis (ICD10-I70.0). Electronically Signed   By: Jeb Levering M.D.   On: 05/13/2018 04:06   Mr Lumbar Spine Wo Contrast  Result Date: 05/13/2018 CLINICAL DATA:  Initial evaluation for persistent right-sided low back pain. EXAM: MRI LUMBAR SPINE WITHOUT CONTRAST TECHNIQUE: Multiplanar, multisequence MR imaging of the lumbar spine was performed. No intravenous contrast was administered. COMPARISON:  Prior CT from earlier the same day. FINDINGS: Segmentation: Normal segmentation. Lowest well-formed disc labeled the L5-S1 level. Alignment: Mild levoscoliosis of the thoracolumbar spine. Alignment otherwise normal with preservation of the normal lumbar lordosis. No listhesis. Vertebrae: Vertebral body heights maintained without evidence for acute or chronic fracture. Bone  marrow signal intensity diffusely heterogeneous and decreased on T1 weighted imaging, suspected be related underlying anemia. No discrete or worrisome osseous lesions. There is abnormal marrow edema centered about the right aspect of the L4-5 interspace, involving both the L4 and L5 vertebral bodies (series 6, image 5). Fluid density present within the intervening L4-5 interspace. Soft tissue edema within the adjacent right psoas muscle. Findings concerning for acute osteomyelitis discitis. Superimposed loculated paraspinous collection at the posterior right psoas muscle measures 19 x 11 mm (series 6, image 2). Probable phlegmonous changes within the adjacent right L4-5 neural foramen. No discernible epidural abscess or other collection. Soft tissue edema extends into the right posterior paraspinous musculature as  well. Mild extension into the left posterior paraspinous soft tissues noted (series 6, image 17). Conus medullaris and cauda equina: Conus extends to the L1-2 level. Conus and cauda equina appear normal. Paraspinal and other soft tissues: Paraspinous changes related to osteomyelitis discitis at L4-5 as above. Paraspinous soft tissues demonstrate no other acute abnormality. Scattered T2 hyperintense cyst noted within the kidneys bilaterally. Few of these cystic lesions demonstrate intrinsic T1 hyperintensity, likely reflecting proteinaceous and/or hemorrhagic cysts, largest of which measures 14 mm at the interpolar right kidney. Paraspinous soft tissues otherwise within normal limits. Disc levels: T11-12: Mild diffuse disc bulge with disc desiccation and reactive endplate changes. Right-sided facet degeneration. No significant stenosis. T12-L1: Mild disc bulge. Superimposed small left paracentral disc protrusion indents the left ventral thecal sac. No significant stenosis. L1-2: Diffuse disc bulge, slightly asymmetric to the right. Mild to moderate facet and ligament flavum hypertrophy. No significant  stenosis. L2-3: Diffuse disc bulge with mild to moderate facet hypertrophy. Superimposed broad left subarticular/foraminal disc protrusion, with the protruding disc contacting the exiting left L2 nerve root in the left neural foramen (series 8, image 21). Mild left lateral recess and foraminal stenosis. L3-4: Diffuse disc bulge. Superimposed broad right foraminal/extraforaminal disc protrusion contacts the exiting right L3 nerve root as it courses out of the right neural foramen (series 8, image 26). Mild to moderate facet hypertrophy. Resultant mild canal with left lateral recess narrowing. Mild left L3 foraminal stenosis. L4-5: Chronic intervertebral disc space narrowing with diffuse disc bulge and reactive endplate changes. Abnormal fluid density within the right L4-5 interspace with changes suggestive of osteomyelitis discitis. Edema extends into the right pedicles of L4 and L5 with phlegmonous change within the right L4-5 neural foramen. No frank epidural abscess or other collection identified on this noncontrast examination. Mild facet and ligament flavum hypertrophy. Resultant mild canal stenosis. Moderate to severe right with mild left lateral recess narrowing. Severe right with moderate left L4 foraminal stenosis (series 7, image 5). L5-S1: Negative interspace. Moderate right with mild left facet hypertrophy. No significant stenosis. IMPRESSION: 1. Findings concerning for acute osteomyelitis discitis involving the right aspect of the L4-5 interspace as above. Associated paraspinous edema within the adjacent right psoas muscle with superimposed 11 x 19 mm paraspinous fluid collection. Probable phlegmon within the right L4-5 neural foramen without frank epidural abscess or other collection. 2. Superimposed multifactorial degenerative changes at L4-5 with resultant moderate to severe right with moderate left L4 foraminal stenosis. 3. Small left foraminal disc protrusion at L2-3, contacting the exiting left L2  nerve root in the left neural foramen. 4. Shallow right foraminal/extraforaminal disc protrusion at L3-4, contacting the exiting right L3 nerve root as it courses out of the right neural foramen. Electronically Signed   By: Jeannine Boga M.D.   On: 05/13/2018 15:00        Scheduled Meds: . acetaminophen  650 mg Oral Q6H  . allopurinol  200 mg Oral Daily  . dexamethasone  4 mg Intravenous Q6H  . gabapentin  100 mg Oral TID  . insulin aspart  0-15 Units Subcutaneous Q4H  . levothyroxine  75 mcg Oral QAC breakfast  . pantoprazole  40 mg Intravenous Q12H  . polyethylene glycol  17 g Oral BID  . simvastatin  40 mg Oral QHS  . sodium chloride flush  3 mL Intravenous Q12H   Continuous Infusions: . sodium chloride 75 mL/hr at 05/14/18 0457  . piperacillin-tazobactam (ZOSYN)  IV Stopped (05/14/18 0345)     LOS:  2 days    Time spent: 40 minutes    WOODS, Geraldo Docker, MD [facility] Pager [phone]   If 7PM-7AM, please contact night-coverage [email or web address] Password [facility] 05/14/2018, 9:38 AM

## 2018-05-15 ENCOUNTER — Inpatient Hospital Stay (HOSPITAL_COMMUNITY): Payer: Medicare Other

## 2018-05-15 HISTORY — PX: IR LUMBAR DISC ASPIRATION W/IMG GUIDE: IMG5306

## 2018-05-15 LAB — GLUCOSE, CAPILLARY
GLUCOSE-CAPILLARY: 227 mg/dL — AB (ref 65–99)
GLUCOSE-CAPILLARY: 321 mg/dL — AB (ref 65–99)
GLUCOSE-CAPILLARY: 346 mg/dL — AB (ref 65–99)
Glucose-Capillary: 176 mg/dL — ABNORMAL HIGH (ref 65–99)
Glucose-Capillary: 188 mg/dL — ABNORMAL HIGH (ref 65–99)
Glucose-Capillary: 236 mg/dL — ABNORMAL HIGH (ref 65–99)
Glucose-Capillary: 300 mg/dL — ABNORMAL HIGH (ref 65–99)

## 2018-05-15 LAB — BASIC METABOLIC PANEL
ANION GAP: 11 (ref 5–15)
BUN: 122 mg/dL — ABNORMAL HIGH (ref 6–20)
CALCIUM: 9.4 mg/dL (ref 8.9–10.3)
CHLORIDE: 99 mmol/L — AB (ref 101–111)
CO2: 21 mmol/L — AB (ref 22–32)
CREATININE: 3.03 mg/dL — AB (ref 0.44–1.00)
GFR calc non Af Amer: 14 mL/min — ABNORMAL LOW (ref >60)
GFR, EST AFRICAN AMERICAN: 16 mL/min — AB (ref >60)
GLUCOSE: 230 mg/dL — AB (ref 65–99)
Potassium: 3.6 mmol/L (ref 3.5–5.1)
Sodium: 131 mmol/L — ABNORMAL LOW (ref 135–145)

## 2018-05-15 LAB — CBC
HEMATOCRIT: 21.7 % — AB (ref 36.0–46.0)
Hemoglobin: 7.3 g/dL — ABNORMAL LOW (ref 12.0–15.0)
MCH: 29.4 pg (ref 26.0–34.0)
MCHC: 33.6 g/dL (ref 30.0–36.0)
MCV: 87.5 fL (ref 78.0–100.0)
PLATELETS: 251 10*3/uL (ref 150–400)
RBC: 2.48 MIL/uL — AB (ref 3.87–5.11)
RDW: 15.4 % (ref 11.5–15.5)
WBC: 9 10*3/uL (ref 4.0–10.5)

## 2018-05-15 LAB — MAGNESIUM: Magnesium: 2.4 mg/dL (ref 1.7–2.4)

## 2018-05-15 LAB — PREPARE RBC (CROSSMATCH)

## 2018-05-15 MED ORDER — BUPIVACAINE HCL (PF) 0.5 % IJ SOLN
INTRAMUSCULAR | Status: AC
  Filled 2018-05-15: qty 30

## 2018-05-15 MED ORDER — PIPERACILLIN-TAZOBACTAM IN DEX 2-0.25 GM/50ML IV SOLN
2.2500 g | Freq: Four times a day (QID) | INTRAVENOUS | Status: DC
  Administered 2018-05-15 – 2018-05-17 (×7): 2.25 g via INTRAVENOUS
  Filled 2018-05-15 (×8): qty 50

## 2018-05-15 MED ORDER — SODIUM CHLORIDE 0.9 % IV SOLN
Freq: Once | INTRAVENOUS | Status: AC
  Administered 2018-05-15: 17:00:00 via INTRAVENOUS

## 2018-05-15 MED ORDER — HYDROMORPHONE HCL 2 MG/ML IJ SOLN
INTRAMUSCULAR | Status: AC
  Filled 2018-05-15: qty 1

## 2018-05-15 MED ORDER — HYDROMORPHONE HCL 1 MG/ML IJ SOLN
INTRAMUSCULAR | Status: AC | PRN
  Administered 2018-05-15: 1 mg via INTRAVENOUS

## 2018-05-15 MED ORDER — FENTANYL CITRATE (PF) 100 MCG/2ML IJ SOLN
INTRAMUSCULAR | Status: AC
  Filled 2018-05-15: qty 2

## 2018-05-15 MED ORDER — FENTANYL CITRATE (PF) 100 MCG/2ML IJ SOLN
INTRAMUSCULAR | Status: AC | PRN
  Administered 2018-05-15 (×2): 25 ug via INTRAVENOUS

## 2018-05-15 MED ORDER — MIDAZOLAM HCL 2 MG/2ML IJ SOLN
INTRAMUSCULAR | Status: AC
  Filled 2018-05-15: qty 4

## 2018-05-15 MED ORDER — MIDAZOLAM HCL 2 MG/2ML IJ SOLN
INTRAMUSCULAR | Status: AC | PRN
  Administered 2018-05-15: 1 mg via INTRAVENOUS

## 2018-05-15 NOTE — Progress Notes (Signed)
CSW received consult regarding discharge planning. Patient and daughter-in-law stated she recently discharged to [facility] and hated it there.  Patient is refusing other SNF placements and would like to return home with home health and family support. RNCM aware.   CSW signing off.   Percell Locus Ragna Kramlich LCSW [phone]

## 2018-05-15 NOTE — Progress Notes (Signed)
PROGRESS NOTE    Erika Fry  YQI:[id number] DOB: [birth year] DOA: 05/12/2018 PCP: [name], MD   Brief Narrative:  72 y.o. WF PMHx  second-degree AV block S/P permanent pacemaker placement on 04/07/2018, HTN, CKD stage IV, Hypothyroidism, Diabetes type 2, Iron Deficiency anemia   Presents to the ED [facility],  she was discharged to home on 6/4 with fatigue, weakness, poor appetite, and nausea.  Patient was admitted from 5/29 through 6/4 after mechanical fall which resulted in severe back pain and difficulty ambulating.  MRI was not obtained due to recent placement of PPM, and CT myelogram close contra indicated due to the patient's baseline CKD.  She was started on a steroid Dosepak and discharged to SNF.  Although she continues to have back pain, she is also began to experience the aforementioned fatigue and weakness.  She states that she has been constipated, and is not sure of the color of her stools.  She denies the presence of frank blood in her stools.  She states that she has been nauseated without emesis.  No hemoptysis, no hematuria, no significant bruising, no new rashes.  She denies abdominal pain.  She is not on any blood thinners or antiplatelet agents.  She denies recent fever, chills, cough, shortness of breath, diarrhea, dysuria.   ED Course: In the ED, the patient is afebrile, mildly hypertensive at 90/55 and saturating comfortably on room air.  Labs are notable for WBC 22.6 (was 20.9 on 6/4), hemoglobin 4.1 (was 8.3 on 6/4), platelets 305, NA 130, BUN 158, creatinine 3.62.  Patient received 500 cc normal saline bolus, Protonix 80 mg IV x1 and 2 units of packed red cells while in the ED.  Critical care was called and felt the patient was appropriate for admission to the hospitalist service.  Gastroneurology was also called, awaiting recommendations.    Subjective: 6/14/O x4, negative CP, negative abdominal pain, negative S OB.  Positive back pain with motion.       Assessment & Plan:   Active Problems:   Blood loss anemia  Acute blood loss anemia/upper GI bleed -Patient seen by [facility] on 6/12 recommend EGD. -Patient has never had screening colonoscopy.  Will require both screening colonoscopy and EGD when GI comfortable with patient stability. -Protonix 40 mg BID  - Hold antiplatelet/anticoagulants - Transfuse: See CHF  -Hemolysis labs pending, anemia panel pending Recent Labs  Lab 05/12/18 1827 05/13/18 0427 05/13/18 1547 05/14/18 1151 05/15/18 0652  HGB 4.1* 6.9* 8.2* 7.5* 7.3*    Leukocytosis -Leukocytosis most likely explained by discitis/RIGHT psoas abscess.  Awaiting findings of aspiration which has been delayed until 6/14.  Per IR will not be able to obtain fluid from abscess (insignificant amount) but will obtain disc fluid/biopsy  -Afebrile last 24 hours  -resolved    CKD stage IV (baseline Cr= 3.0-4.0) Recent Labs  Lab 05/12/18 1827 05/13/18 0545 05/14/18 0636 05/15/18 0652  CREATININE 3.62* 3.54* 3.31* 3.03*  -Baseline - Avoid nephrotoxins - Monitor closely   Hypovolemic hyponatremia - Normal saline 73ml/hr Recent Labs  Lab 05/12/18 1827 05/13/18 0545 05/14/18 0636 05/15/18 0652  NA 130* 131* 136 131*  -Resolved  CHF - Echocardiogram 03/20/2018: Hyperdynamic LV with severe basal septal hypertrophy and LVOT obstruction. -Strict in and out - Daily weight - Transfuse hemoglobin<8 -6/11 transfused 2 units PRBC  -6/12 transfuse 1 unit PRBC -6/13 transfuse 1 unit PRBC -6/14 transfuse 2 units PRBC  Second degree AV block, Mobitz II - s/p PPM  placement - Monitor on telemetry   Low back pain, subsequent encounter/RIGHT LOWER extremity sciatica -Unable to obtain MRI secondary to pacer - 6/12 start Gabapentin at 100 mg TID -Robaxin 500 mg TID -Decadron 10 mg x 1 -Decadron 4 mg QID  Acute discitis/osteomyelitis -L-spine MRI concerning for acute osteomyelitis/discitis in addition phlegmon vs  abscess - Begin empiric antibiotics: Vancomycin + Zosyn - Decadron - 6/13 IR unable to drain RIGHT psoas muscle paraspinous fluid collection. -6/14 S/P aspiration L4/5 see below.  Await labs. - If patient remains afebrile overnight and negative leukocytosis obtain repeat blood culture.  Lumbar nerve root impingement -See low back pain -See acute discitis   HypoThyroidism -Synthroid-75 mcg daily  Diabetes type 2 controlled with complication - 0/09 hemoglobin A1c= 6.3 -6/13 increase resistant SSI       DVT prophylaxis: SCD Code Status: Full Family Communication: None Disposition Plan: SNF vs home with palliative care   Consultants:  [facility] IR    Procedures/Significant Events:  6/12 L-spine MRI:-Findings concerning for acute osteomyelitis discitis involving the right aspect of the L4-5  -paraspinous edema within the adjacent right psoas muscle with superimposed 11 x 19 mm paraspinous fluid collection. Probable phlegmon within the right L4-5 neural foramen without frank epidural abscess or other collection. - Superimposed multifactorial degenerative changes at L4-5 with resultant moderate to severe right with moderate left L4 foraminal stenosis. - Small left foraminal disc protrusion at L2-3, contacting the exiting left L2 nerve root in the left neural foramen. -Shallow right foraminal/extraforaminal disc protrusion at L3-4,contacting the exiting right L3 nerve root as it courses out of the right neural foramen. 6/14 IR guided needle aspiration/injection L4/L5: Aspiration 4 cc thick bloody aspirate     I have personally reviewed and interpreted all radiology studies and my findings are as above.  VENTILATOR SETTINGS:    Cultures      Antimicrobials: Anti-infectives (From admission, onward)   Start     Stop   05/14/18 0300  piperacillin-tazobactam (ZOSYN) IVPB 2.25 g         05/13/18 2000  vancomycin (VANCOCIN) 1,750 mg in sodium chloride 0.9 % 500 mL IVPB          05/13/18 2000  piperacillin-tazobactam (ZOSYN) IVPB 3.375 g             Devices    LINES / TUBES:      Continuous Infusions: . sodium chloride 75 mL/hr at 05/14/18 2119  . piperacillin-tazobactam (ZOSYN)  IV 2.25 g (05/15/18 0905)  . vancomycin       Objective: Vitals:   05/14/18 2216 05/15/18 0229 05/15/18 0500 05/15/18 0515  BP: (!) 137/56 121/66    Pulse: 92 85    Resp: (!) 21 17    Temp:    98 F (36.7 C)  TempSrc:    Oral  SpO2: 100% 96%    Weight:   199 lb 9.6 oz (90.5 kg)   Height:        Intake/Output Summary (Last 24 hours) at 05/15/2018 0930 Last data filed at 05/15/2018 0500 Gross per 24 hour  Intake 258 ml  Output 1150 ml  Net -892 ml   Filed Weights   05/13/18 1252 05/14/18 0654 05/15/18 0500  Weight: 189 lb 6 oz (85.9 kg) 199 lb 8.3 oz (90.5 kg) 199 lb 9.6 oz (90.5 kg)    Physical Exam:  General: A/O x4, No acute respiratory distress Lungs: Clear to auscultation bilaterally without wheezes or crackles Cardiovascular: Regular rate and  rhythm (paced) without murmur gallop or rub normal S1 and S2 Abdomen: negative abdominal pain, nondistended, positive soft, bowel sounds, no rebound, no ascites, no appreciable mass Extremities: No significant cyanosis, clubbing, or edema bilateral lower extremities Skin: Negative rashes, lesions, ulcers Psychiatric:  Negative depression, negative anxiety, negative fatigue, negative mania  Central nervous system:  Cranial nerves II through XII intact, tongue/uvula midline, all extremities muscle strength 5/5 except LLE strength 4/5, sensation intact throughout,  negative dysarthria, negative expressive aphasia, negative receptive aphasia.  Pain to palpation right lateral hip .     Data Reviewed: Care during the described time interval was provided by me .  I have reviewed this patient's available data, including medical history, events of note, physical examination, and all test results as part of my  evaluation.   CBC: Recent Labs  Lab 05/12/18 1827 05/13/18 0427 05/13/18 1547 05/14/18 1151 05/15/18 0652  WBC 22.6* 21.4*  --  12.0* 9.0  NEUTROABS 19.2*  --   --  11.3*  --   HGB 4.1* 6.9* 8.2* 7.5* 7.3*  HCT 12.6* 20.0*  20.1* 24.2* 22.1* 21.7*  MCV 90.0 88.1  --  87.4 87.5  PLT 305 290  --  288 175   Basic Metabolic Panel: Recent Labs  Lab 05/12/18 1827 05/13/18 0427 05/13/18 0545 05/14/18 0636 05/15/18 0652  NA 130*  --  131* 136 131*  K 3.9  --  3.9 4.4 3.6  CL 90*  --  96* 97* 99*  CO2 25  --  20* 20* 21*  GLUCOSE 191*  --  157* 214* 230*  BUN 158*  --  157* 131* 122*  CREATININE 3.62*  --  3.54* 3.31* 3.03*  CALCIUM 9.7  --  9.2 9.6 9.4  MG  --  2.2  --  2.4 2.4  PHOS  --  7.0*  --   --   --    GFR: Estimated Creatinine Clearance: 18.1 mL/min (A) (by C-G formula based on SCr of 3.03 mg/dL (H)). Liver Function Tests: Recent Labs  Lab 05/13/18 0427  AST 22  ALT 11*  ALKPHOS 62  BILITOT 1.1  PROT 5.7*  ALBUMIN 2.4*   No results for input(s): LIPASE, AMYLASE in the last 168 hours. No results for input(s): AMMONIA in the last 168 hours. Coagulation Profile: Recent Labs  Lab 05/13/18 0427 05/14/18 1151  INR 1.01 1.01   Cardiac Enzymes: No results for input(s): CKTOTAL, CKMB, CKMBINDEX, TROPONINI in the last 168 hours. BNP (last 3 results) No results for input(s): PROBNP in the last 8760 hours. HbA1C: No results for input(s): HGBA1C in the last 72 hours. CBG: Recent Labs  Lab 05/14/18 1645 05/14/18 2013 05/15/18 0003 05/15/18 0434 05/15/18 0807  GLUCAP 219* 261* 300* 236* 227*   Lipid Profile: No results for input(s): CHOL, HDL, LDLCALC, TRIG, CHOLHDL, LDLDIRECT in the last 72 hours. Thyroid Function Tests: No results for input(s): TSH, T4TOTAL, FREET4, T3FREE, THYROIDAB in the last 72 hours. Anemia Panel: Recent Labs    05/13/18 0427  VITAMINB12 5,448*  FERRITIN 278  TIBC 304  IRON 91  RETICCTPCT 4.9*   Urine analysis:     Component Value Date/Time   COLORURINE YELLOW 05/13/2018 0140   APPEARANCEUR HAZY (A) 05/13/2018 0140   LABSPEC 1.013 05/13/2018 0140   PHURINE 5.0 05/13/2018 0140   GLUCOSEU NEGATIVE 05/13/2018 0140   HGBUR SMALL (A) 05/13/2018 0140   BILIRUBINUR NEGATIVE 05/13/2018 0140   KETONESUR NEGATIVE 05/13/2018 0140   PROTEINUR NEGATIVE 05/13/2018  0140   NITRITE NEGATIVE 05/13/2018 0140   LEUKOCYTESUR SMALL (A) 05/13/2018 0140   Sepsis Labs: @LABRCNTIP (procalcitonin:4,lacticidven:4)  )No results found for this or any previous visit (from the past 240 hour(s)).       Radiology Studies: Mr Lumbar Spine Wo Contrast  Result Date: 05/13/2018 CLINICAL DATA:  Initial evaluation for persistent right-sided low back pain. EXAM: MRI LUMBAR SPINE WITHOUT CONTRAST TECHNIQUE: Multiplanar, multisequence MR imaging of the lumbar spine was performed. No intravenous contrast was administered. COMPARISON:  Prior CT from earlier the same day. FINDINGS: Segmentation: Normal segmentation. Lowest well-formed disc labeled the L5-S1 level. Alignment: Mild levoscoliosis of the thoracolumbar spine. Alignment otherwise normal with preservation of the normal lumbar lordosis. No listhesis. Vertebrae: Vertebral body heights maintained without evidence for acute or chronic fracture. Bone marrow signal intensity diffusely heterogeneous and decreased on T1 weighted imaging, suspected be related underlying anemia. No discrete or worrisome osseous lesions. There is abnormal marrow edema centered about the right aspect of the L4-5 interspace, involving both the L4 and L5 vertebral bodies (series 6, image 5). Fluid density present within the intervening L4-5 interspace. Soft tissue edema within the adjacent right psoas muscle. Findings concerning for acute osteomyelitis discitis. Superimposed loculated paraspinous collection at the posterior right psoas muscle measures 19 x 11 mm (series 6, image 2). Probable phlegmonous changes  within the adjacent right L4-5 neural foramen. No discernible epidural abscess or other collection. Soft tissue edema extends into the right posterior paraspinous musculature as well. Mild extension into the left posterior paraspinous soft tissues noted (series 6, image 17). Conus medullaris and cauda equina: Conus extends to the L1-2 level. Conus and cauda equina appear normal. Paraspinal and other soft tissues: Paraspinous changes related to osteomyelitis discitis at L4-5 as above. Paraspinous soft tissues demonstrate no other acute abnormality. Scattered T2 hyperintense cyst noted within the kidneys bilaterally. Few of these cystic lesions demonstrate intrinsic T1 hyperintensity, likely reflecting proteinaceous and/or hemorrhagic cysts, largest of which measures 14 mm at the interpolar right kidney. Paraspinous soft tissues otherwise within normal limits. Disc levels: T11-12: Mild diffuse disc bulge with disc desiccation and reactive endplate changes. Right-sided facet degeneration. No significant stenosis. T12-L1: Mild disc bulge. Superimposed small left paracentral disc protrusion indents the left ventral thecal sac. No significant stenosis. L1-2: Diffuse disc bulge, slightly asymmetric to the right. Mild to moderate facet and ligament flavum hypertrophy. No significant stenosis. L2-3: Diffuse disc bulge with mild to moderate facet hypertrophy. Superimposed broad left subarticular/foraminal disc protrusion, with the protruding disc contacting the exiting left L2 nerve root in the left neural foramen (series 8, image 21). Mild left lateral recess and foraminal stenosis. L3-4: Diffuse disc bulge. Superimposed broad right foraminal/extraforaminal disc protrusion contacts the exiting right L3 nerve root as it courses out of the right neural foramen (series 8, image 26). Mild to moderate facet hypertrophy. Resultant mild canal with left lateral recess narrowing. Mild left L3 foraminal stenosis. L4-5: Chronic  intervertebral disc space narrowing with diffuse disc bulge and reactive endplate changes. Abnormal fluid density within the right L4-5 interspace with changes suggestive of osteomyelitis discitis. Edema extends into the right pedicles of L4 and L5 with phlegmonous change within the right L4-5 neural foramen. No frank epidural abscess or other collection identified on this noncontrast examination. Mild facet and ligament flavum hypertrophy. Resultant mild canal stenosis. Moderate to severe right with mild left lateral recess narrowing. Severe right with moderate left L4 foraminal stenosis (series 7, image 5). L5-S1: Negative interspace. Moderate right with  mild left facet hypertrophy. No significant stenosis. IMPRESSION: 1. Findings concerning for acute osteomyelitis discitis involving the right aspect of the L4-5 interspace as above. Associated paraspinous edema within the adjacent right psoas muscle with superimposed 11 x 19 mm paraspinous fluid collection. Probable phlegmon within the right L4-5 neural foramen without frank epidural abscess or other collection. 2. Superimposed multifactorial degenerative changes at L4-5 with resultant moderate to severe right with moderate left L4 foraminal stenosis. 3. Small left foraminal disc protrusion at L2-3, contacting the exiting left L2 nerve root in the left neural foramen. 4. Shallow right foraminal/extraforaminal disc protrusion at L3-4, contacting the exiting right L3 nerve root as it courses out of the right neural foramen. Electronically Signed   By: Jeannine Boga M.D.   On: 05/13/2018 15:00        Scheduled Meds: . acetaminophen  650 mg Oral Q6H  . allopurinol  200 mg Oral Daily  . dexamethasone  4 mg Intravenous Q6H  . gabapentin  100 mg Oral TID  . insulin aspart  0-20 Units Subcutaneous Q4H  . levothyroxine  75 mcg Oral QAC breakfast  . pantoprazole  40 mg Intravenous Q12H  . polyethylene glycol  17 g Oral BID  . simvastatin  40 mg Oral  QHS  . sodium chloride flush  3 mL Intravenous Q12H   Continuous Infusions: . sodium chloride 75 mL/hr at 05/14/18 2119  . piperacillin-tazobactam (ZOSYN)  IV 2.25 g (05/15/18 0905)  . vancomycin       LOS: 3 days    Time spent: 40 minutes    Teri Legacy, Geraldo Docker, MD [facility] Pager [phone]   If 7PM-7AM, please contact night-coverage [email or web address] Password [email or web address] 05/15/2018, 9:30 AM

## 2018-05-15 NOTE — Sedation Documentation (Signed)
Patient is resting comfortably. 

## 2018-05-15 NOTE — Sedation Documentation (Signed)
Patient is resting comfortably. 

## 2018-05-15 NOTE — Progress Notes (Signed)
GI service will follow peripherally right now while she is undergoing treatment for her back, plans for aspiration per IR today. IF she is otherwise stable without active bleeding we will plan on seeing her again in a few days for reassessment and to see if she is ready for EGD / colonoscopy early next week. Call in the interim with questions or overt bleeding.  [place] Cellar, MD [facility]

## 2018-05-15 NOTE — Procedures (Signed)
S/P L4/L5 fluoroguided needle aspiration  Of approx 4 cc of thick bloody aspirate

## 2018-05-15 NOTE — Care Management Important Message (Signed)
Important Message  Patient Details  Name: Erika Fry MRN: [id number] Date of Birth: Oct 04, 1946   Medicare Important Message Given:  Yes    [name] 05/15/2018, 4:00 PM

## 2018-05-16 LAB — GLUCOSE, CAPILLARY
GLUCOSE-CAPILLARY: 287 mg/dL — AB (ref 65–99)
Glucose-Capillary: 242 mg/dL — ABNORMAL HIGH (ref 65–99)
Glucose-Capillary: 202 mg/dL — ABNORMAL HIGH (ref 65–99)
Glucose-Capillary: 284 mg/dL — ABNORMAL HIGH (ref 65–99)
Glucose-Capillary: 297 mg/dL — ABNORMAL HIGH (ref 65–99)

## 2018-05-16 LAB — TYPE AND SCREEN
ABO/RH(D): A POS
Antibody Screen: NEGATIVE
UNIT DIVISION: 0
UNIT DIVISION: 0
Unit division: 0
Unit division: 0
Unit division: 0
Unit division: 0

## 2018-05-16 LAB — BPAM RBC
BLOOD PRODUCT EXPIRATION DATE: 201906202359
Blood Product Expiration Date: 201907022359
Blood Product Expiration Date: 201907022359
Blood Product Expiration Date: 201907022359
Blood Product Expiration Date: 201907042359
Blood Product Expiration Date: 201907052359
ISSUE DATE / TIME: 201906112046
ISSUE DATE / TIME: 201906120823
ISSUE DATE / TIME: 201906131843
ISSUE DATE / TIME: 201906120104
ISSUE DATE / TIME: 201906141716
ISSUE DATE / TIME: 201906142355
UNIT TYPE AND RH: 6200
UNIT TYPE AND RH: 6200
Unit Type and Rh: 600
Unit Type and Rh: 6200
Unit Type and Rh: 6200
Unit Type and Rh: 6200

## 2018-05-16 LAB — CBC
HCT: 28.8 % — ABNORMAL LOW (ref 36.0–46.0)
HEMOGLOBIN: 9.8 g/dL — AB (ref 12.0–15.0)
MCH: 29.4 pg (ref 26.0–34.0)
MCHC: 34 g/dL (ref 30.0–36.0)
MCV: 86.5 fL (ref 78.0–100.0)
Platelets: 234 10*3/uL (ref 150–400)
RBC: 3.33 MIL/uL — AB (ref 3.87–5.11)
RDW: 16 % — AB (ref 11.5–15.5)
WBC: 8.6 10*3/uL (ref 4.0–10.5)

## 2018-05-16 LAB — BASIC METABOLIC PANEL
ANION GAP: 11 (ref 5–15)
BUN: 113 mg/dL — ABNORMAL HIGH (ref 6–20)
CHLORIDE: 98 mmol/L — AB (ref 101–111)
CO2: 22 mmol/L (ref 22–32)
CREATININE: 2.8 mg/dL — AB (ref 0.44–1.00)
Calcium: 9.5 mg/dL (ref 8.9–10.3)
GFR calc Af Amer: 18 mL/min — ABNORMAL LOW (ref >60)
GFR calc non Af Amer: 16 mL/min — ABNORMAL LOW (ref >60)
Glucose, Bld: 238 mg/dL — ABNORMAL HIGH (ref 65–99)
POTASSIUM: 4 mmol/L (ref 3.5–5.1)
SODIUM: 131 mmol/L — AB (ref 135–145)

## 2018-05-16 LAB — MAGNESIUM: MAGNESIUM: 2.4 mg/dL (ref 1.7–2.4)

## 2018-05-16 MED ORDER — DEXAMETHASONE SODIUM PHOSPHATE 10 MG/ML IJ SOLN
4.0000 mg | Freq: Two times a day (BID) | INTRAMUSCULAR | Status: DC
  Administered 2018-05-17 – 2018-05-19 (×5): 4 mg via INTRAVENOUS
  Filled 2018-05-16 (×4): qty 1

## 2018-05-16 MED ORDER — INSULIN GLARGINE 100 UNIT/ML ~~LOC~~ SOLN
8.0000 [IU] | Freq: Every day | SUBCUTANEOUS | Status: DC
  Administered 2018-05-16 – 2018-05-18 (×3): 8 [IU] via SUBCUTANEOUS
  Filled 2018-05-16 (×4): qty 0.08

## 2018-05-16 NOTE — Progress Notes (Addendum)
PROGRESS NOTE    Erika Fry  EYC:[id number] DOB: 1946/06/25 DOA: 05/12/2018 PCP: [name], MD   Brief Narrative:  72 y.o. WF PMHx  second-degree AV block S/P permanent pacemaker placement on 04/07/2018, HTN, CKD stage IV, Hypothyroidism, Diabetes type 2, Iron Deficiency anemia   Presents to the ED [facility],  she was discharged to home on 6/4 with fatigue, weakness, poor appetite, and nausea.  Patient was admitted from 5/29 through 6/4 after mechanical fall which resulted in severe back pain and difficulty ambulating.  MRI was not obtained due to recent placement of PPM, and CT myelogram close contra indicated due to the patient's baseline CKD.  She was started on a steroid Dosepak and discharged to SNF.  Although she continues to have back pain, she is also began to experience the aforementioned fatigue and weakness.  She states that she has been constipated, and is not sure of the color of her stools.  She denies the presence of frank blood in her stools.  She states that she has been nauseated without emesis.  No hemoptysis, no hematuria, no significant bruising, no new rashes.  She denies abdominal pain.  She is not on any blood thinners or antiplatelet agents.  She denies recent fever, chills, cough, shortness of breath, diarrhea, dysuria.   ED Course: In the ED, the patient is afebrile, mildly hypertensive at 90/55 and saturating comfortably on room air.  Labs are notable for WBC 22.6 (was 20.9 on 6/4), hemoglobin 4.1 (was 8.3 on 6/4), platelets 305, NA 130, BUN 158, creatinine 3.62.  Patient received 500 cc normal saline bolus, Protonix 80 mg IV x1 and 2 units of packed red cells while in the ED.  Critical care was called and felt the patient was appropriate for admission to the hospitalist service.  Gastroneurology was also called, awaiting recommendations.    Subjective: 6/15 A/O x4, negative CP, negative abdominal pain, negative S OB.  Positive back pain/right hip pain  with motion     Assessment & Plan:   Active Problems:   Blood loss anemia  Acute blood loss anemia/upper GI bleed -Patient seen by [facility] on 6/12 recommend EGD. -Patient has never had screening colonoscopy.  Will require both screening colonoscopy and EGD when GI comfortable with patient stability. -Protonix 40 mg BID  - Hold antiplatelet/anticoagulants - Transfuse: See CHF  -Hemolysis labs pending, anemia panel pending Recent Labs  Lab 05/12/18 1827 05/13/18 0427 05/13/18 1547 05/14/18 1151 05/15/18 0652 05/16/18 0517  HGB 4.1* 6.9* 8.2* 7.5* 7.3* 9.8*  -On Monday 6/17 discussed with GI will they be able to perform EGD/colonoscopy now that patient has been on antibiotics for~a week?  Leukocytosis -Leukocytosis most likely explained by discitis/RIGHT psoas abscess.  Awaiting findings of aspiration which has been delayed until 6/14.  Per IR will not be able to obtain fluid from abscess (insignificant amount) but will obtain disc fluid/biopsy  -Afebrile last 24 hours  -resolved    CKD stage IV (baseline Cr= 3.0-4.0) Recent Labs  Lab 05/12/18 1827 05/13/18 0545 05/14/18 0636 05/15/18 0652 05/16/18 0517  CREATININE 3.62* 3.54* 3.31* 3.03* 2.80*  -Baseline - Avoid nephrotoxins - Continues to improve   Hypovolemic hyponatremia - Normal saline 21ml/hr Recent Labs  Lab 05/12/18 1827 05/13/18 0545 05/14/18 0636 05/15/18 0652 05/16/18 0517  NA 130* 131* 136 131* 131*  -Mostly resolved  CHF - Echocardiogram 03/20/2018: Hyperdynamic LV with severe basal septal hypertrophy and LVOT obstruction. -Strict in and out - Daily weight -  Transfuse hemoglobin<8 -6/11 transfused 2 units PRBC  -6/12 transfuse 1 unit PRBC -6/13 transfuse 1 unit PRBC -6/14 transfuse 2 units PRBC  Second degree AV block, Mobitz II - s/p PPM placement - Monitor on telemetry   Low back pain, subsequent encounter/RIGHT LOWER extremity sciatica -Unable to obtain MRI secondary to pacer -  6/12 start Gabapentin at 100 mg TID -Robaxin 500 mg TID -Decadron 10 mg x 1 -6/15 decrease Decadron 4 mg BID    Acute discitis/osteomyelitis -L-spine MRI concerning for acute osteomyelitis/discitis in addition phlegmon vs abscess - Begin empiric antibiotics: Vancomycin + Zosyn - Decadron - 6/13 IR unable to drain RIGHT psoas muscle paraspinous fluid collection. -6/14 S/P aspiration L4/5 see below.  Await labs. - If patient remains afebrile overnight and negative leukocytosis obtain repeat blood culture.  Lumbar nerve root impingement -See low back pain -See acute discitis   HypoThyroidism -Synthroid-75 mcg daily  Diabetes type 2 controlled with complication - 2/54 hemoglobin A1c= 6.3 -6/15 Lantus 8 units daily -resistant SSI    Goals of care -PT/OT consult: Patient with acute discitis/osteomyelitis, ongoing GI bleed evaluate for LTAC     DVT prophylaxis: SCD Code Status: Full Family Communication: None Disposition Plan: SNF vs home with palliative care   Consultants:   GI IR    Procedures/Significant Events:  6/12 L-spine MRI:-Findings concerning for acute osteomyelitis discitis involving the right aspect of the L4-5  -paraspinous edema within the adjacent right psoas muscle with superimposed 11 x 19 mm paraspinous fluid collection. Probable phlegmon within the right L4-5 neural foramen without frank epidural abscess or other collection. - Superimposed multifactorial degenerative changes at L4-5 with resultant moderate to severe right with moderate left L4 foraminal stenosis. - Small left foraminal disc protrusion at L2-3, contacting the exiting left L2 nerve root in the left neural foramen. -Shallow right foraminal/extraforaminal disc protrusion at L3-4,contacting the exiting right L3 nerve root as it courses out of the right neural foramen. 6/14 IR guided needle aspiration/injection L4/L5: Aspiration 4 cc thick bloody aspirate     I have personally  reviewed and interpreted all radiology studies and my findings are as above.  VENTILATOR SETTINGS:    Cultures 6/14 lumbar aspirate NGTD 6/14 CSF pending 6/15 blood pending      Antimicrobials: Anti-infectives (From admission, onward)   Start     Stop   05/14/18 0300  piperacillin-tazobactam (ZOSYN) IVPB 2.25 g         05/13/18 2000  vancomycin (VANCOCIN) 1,750 mg in sodium chloride 0.9 % 500 mL IVPB         05/13/18 2000  piperacillin-tazobactam (ZOSYN) IVPB 3.375 g             Devices    LINES / TUBES:      Continuous Infusions: . sodium chloride 75 mL/hr at 05/16/18 0412  . piperacillin-tazobactam (ZOSYN)  IV Stopped (05/16/18 1644)  . vancomycin Stopped (05/15/18 2327)     Objective: Vitals:   05/16/18 0810 05/16/18 1210 05/16/18 1224 05/16/18 1600  BP: 140/75 (!) 141/71    Pulse: 70 73    Resp:  13    Temp:  98.1 F (36.7 C) 97.7 F (36.5 C) 97.7 F (36.5 C)  TempSrc:  Oral Oral Oral  SpO2: 96% 100%    Weight:      Height:        Intake/Output Summary (Last 24 hours) at 05/16/2018 1726 Last data filed at 05/16/2018 1600 Gross per 24 hour  Intake 2704.66 ml  Output 1300 ml  Net 1404.66 ml   Filed Weights   05/13/18 1252 05/14/18 0654 05/15/18 0500  Weight: 189 lb 6 oz (85.9 kg) 199 lb 8.3 oz (90.5 kg) 199 lb 9.6 oz (90.5 kg)    Physical Exam:  General: A/O x4, No acute respiratory distress Lungs: Clear to auscultation bilaterally without wheezes or crackles Cardiovascular: Regular rate and rhythm (paced) without murmur gallop or rub normal S1 and S2 Abdomen: negative abdominal pain, nondistended, positive soft, bowel sounds, no rebound, no ascites, no appreciable mass Extremities: No significant cyanosis, clubbing, or edema bilateral lower extremities Skin: Negative rashes, lesions, ulcers Psychiatric:  Negative depression, negative anxiety, negative fatigue, negative mania  Central nervous system:  Cranial nerves II through XII  intact, tongue/uvula midline, all extremities muscle strength 5/5 except LLE strength 4/5, sensation intact throughout,  negative dysarthria, negative expressive aphasia, negative receptive aphasia.  Pain to palpation right lateral hip .     Data Reviewed: Care during the described time interval was provided by me .  I have reviewed this patient's available data, including medical history, events of note, physical examination, and all test results as part of my evaluation.   CBC: Recent Labs  Lab 05/12/18 1827 05/13/18 0427 05/13/18 1547 05/14/18 1151 05/15/18 0652 05/16/18 0517  WBC 22.6* 21.4*  --  12.0* 9.0 8.6  NEUTROABS 19.2*  --   --  11.3*  --   --   HGB 4.1* 6.9* 8.2* 7.5* 7.3* 9.8*  HCT 12.6* 20.0*  20.1* 24.2* 22.1* 21.7* 28.8*  MCV 90.0 88.1  --  87.4 87.5 86.5  PLT 305 290  --  288 251 962   Basic Metabolic Panel: Recent Labs  Lab 05/12/18 1827 05/13/18 0427 05/13/18 0545 05/14/18 0636 05/15/18 0652 05/16/18 0517  NA 130*  --  131* 136 131* 131*  K 3.9  --  3.9 4.4 3.6 4.0  CL 90*  --  96* 97* 99* 98*  CO2 25  --  20* 20* 21* 22  GLUCOSE 191*  --  157* 214* 230* 238*  BUN 158*  --  157* 131* 122* 113*  CREATININE 3.62*  --  3.54* 3.31* 3.03* 2.80*  CALCIUM 9.7  --  9.2 9.6 9.4 9.5  MG  --  2.2  --  2.4 2.4 2.4  PHOS  --  7.0*  --   --   --   --    GFR: Estimated Creatinine Clearance: 19.6 mL/min (A) (by C-G formula based on SCr of 2.8 mg/dL (H)). Liver Function Tests: Recent Labs  Lab 05/13/18 0427  AST 22  ALT 11*  ALKPHOS 62  BILITOT 1.1  PROT 5.7*  ALBUMIN 2.4*   No results for input(s): LIPASE, AMYLASE in the last 168 hours. No results for input(s): AMMONIA in the last 168 hours. Coagulation Profile: Recent Labs  Lab 05/13/18 0427 05/14/18 1151  INR 1.01 1.01   Cardiac Enzymes: No results for input(s): CKTOTAL, CKMB, CKMBINDEX, TROPONINI in the last 168 hours. BNP (last 3 results) No results for input(s): PROBNP in the last 8760  hours. HbA1C: No results for input(s): HGBA1C in the last 72 hours. CBG: Recent Labs  Lab 05/15/18 2339 05/16/18 0441 05/16/18 0803 05/16/18 1147 05/16/18 1709  GLUCAP 346* 242* 202* 284* 297*   Lipid Profile: No results for input(s): CHOL, HDL, LDLCALC, TRIG, CHOLHDL, LDLDIRECT in the last 72 hours. Thyroid Function Tests: No results for input(s): TSH, T4TOTAL, FREET4, T3FREE, THYROIDAB in the last 72 hours.  Anemia Panel: No results for input(s): VITAMINB12, FOLATE, FERRITIN, TIBC, IRON, RETICCTPCT in the last 72 hours. Urine analysis:    Component Value Date/Time   COLORURINE YELLOW 05/13/2018 0140   APPEARANCEUR HAZY (A) 05/13/2018 0140   LABSPEC 1.013 05/13/2018 0140   PHURINE 5.0 05/13/2018 0140   GLUCOSEU NEGATIVE 05/13/2018 0140   HGBUR SMALL (A) 05/13/2018 0140   BILIRUBINUR NEGATIVE 05/13/2018 0140   KETONESUR NEGATIVE 05/13/2018 0140   PROTEINUR NEGATIVE 05/13/2018 0140   NITRITE NEGATIVE 05/13/2018 0140   LEUKOCYTESUR SMALL (A) 05/13/2018 0140   Sepsis Labs: @LABRCNTIP (procalcitonin:4,lacticidven:4)  ) Recent Results (from the past 240 hour(s))  Aerobic/Anaerobic Culture (surgical/deep wound)     Status: None (Preliminary result)   Collection Time: 05/15/18  3:09 PM  Result Value Ref Range Status   Specimen Description ABSCESS  Final   Special Requests LUMBAR ASPIRATE  Final   Gram Stain   Final    FEW WBC PRESENT,BOTH PMN AND MONONUCLEAR NO ORGANISMS SEEN    Culture   Final    NO GROWTH < 24 HOURS Performed at [facility], [place] [address] [place], [place] [address]    Report Status PENDING  Incomplete         Radiology Studies: No results found.      Scheduled Meds: . acetaminophen  650 mg Oral Q6H  . allopurinol  200 mg Oral Daily  . dexamethasone  4 mg Intravenous Q6H  . gabapentin  100 mg Oral TID  . insulin aspart  0-20 Units Subcutaneous Q4H  . levothyroxine  75 mcg Oral QAC breakfast  . pantoprazole  40 mg Intravenous  Q12H  . polyethylene glycol  17 g Oral BID  . simvastatin  40 mg Oral QHS  . sodium chloride flush  3 mL Intravenous Q12H   Continuous Infusions: . sodium chloride 75 mL/hr at 05/16/18 0412  . piperacillin-tazobactam (ZOSYN)  IV Stopped (05/16/18 1644)  . vancomycin Stopped (05/15/18 2327)     LOS: 4 days    Time spent: 40 minutes    Tamico Mundo, Geraldo Docker, MD [facility] Pager [phone]   If 7PM-7AM, please contact night-coverage [email or web address] Password [facility] 05/16/2018, 5:26 PM

## 2018-05-16 NOTE — Progress Notes (Signed)
Pharmacy Antibiotic Note  Erika Fry is a 72 y.o. female admitted on 05/12/2018 with osteomyelitis and spinal abscess.  Pharmacy has been consulted for Vancomycin and Zosyn dosing. Patient has a history of CKD. SCr 3.54 on admission now down to 2.8. WBC has also come down to 8.6. Would culture collected 6/14, will follow-up to guide therapy.  Plan: -Vancomycin 1,000 mg every 48 hours -Zosyn 2.25 gm IV Q 6 hours given CKD  -Monitor CBC, renal fx, cultures and clinical progress   Height: 5\' 5"  (165.1 cm) Weight: 199 lb 9.6 oz (90.5 kg) IBW/kg (Calculated) : 57  Temp (24hrs), Avg:97.7 F (36.5 C), Min:97.4 F (36.3 C), Max:98 F (36.7 C)  Recent Labs  Lab 05/12/18 1827 05/13/18 0427 05/13/18 0545 05/14/18 0636 05/14/18 1151 05/15/18 0652 05/16/18 0517  WBC 22.6* 21.4*  --   --  12.0* 9.0 8.6  CREATININE 3.62*  --  3.54* 3.31*  --  3.03* 2.80*    Estimated Creatinine Clearance: 19.6 mL/min (A) (by C-G formula based on SCr of 2.8 mg/dL (H)).    Allergies  Allergen Reactions  . Codeine Other (See Comments)    Increases Pain and couldn't sleep    Antimicrobials this admission: Vanc 6/12 >>  Zosyn 6/12 >>   Microbiology results: Fungus Cx 6/14 > pending Wound Cx 6/14 > ngtd  Thank you for allowing pharmacy to be a part of this patient's care.  Vincie Linn L. Kyung Rudd, PharmD, [place] PGY1 Pharmacy Resident

## 2018-05-17 ENCOUNTER — Other Ambulatory Visit: Payer: Self-pay

## 2018-05-17 DIAGNOSIS — Z950 Presence of cardiac pacemaker: Secondary | ICD-10-CM

## 2018-05-17 DIAGNOSIS — Z95828 Presence of other vascular implants and grafts: Secondary | ICD-10-CM

## 2018-05-17 DIAGNOSIS — M4646 Discitis, unspecified, lumbar region: Secondary | ICD-10-CM

## 2018-05-17 DIAGNOSIS — Z885 Allergy status to narcotic agent status: Secondary | ICD-10-CM

## 2018-05-17 DIAGNOSIS — R011 Cardiac murmur, unspecified: Secondary | ICD-10-CM

## 2018-05-17 DIAGNOSIS — I129 Hypertensive chronic kidney disease with stage 1 through stage 4 chronic kidney disease, or unspecified chronic kidney disease: Secondary | ICD-10-CM

## 2018-05-17 DIAGNOSIS — N184 Chronic kidney disease, stage 4 (severe): Secondary | ICD-10-CM

## 2018-05-17 DIAGNOSIS — Z833 Family history of diabetes mellitus: Secondary | ICD-10-CM

## 2018-05-17 DIAGNOSIS — I441 Atrioventricular block, second degree: Secondary | ICD-10-CM

## 2018-05-17 DIAGNOSIS — Z8249 Family history of ischemic heart disease and other diseases of the circulatory system: Secondary | ICD-10-CM

## 2018-05-17 LAB — CBC
HEMATOCRIT: 32.2 % — AB (ref 36.0–46.0)
Hemoglobin: 10.7 g/dL — ABNORMAL LOW (ref 12.0–15.0)
MCH: 29.3 pg (ref 26.0–34.0)
MCHC: 33.2 g/dL (ref 30.0–36.0)
MCV: 88.2 fL (ref 78.0–100.0)
PLATELETS: 237 10*3/uL (ref 150–400)
RBC: 3.65 MIL/uL — ABNORMAL LOW (ref 3.87–5.11)
RDW: 16.2 % — ABNORMAL HIGH (ref 11.5–15.5)
WBC: 8 10*3/uL (ref 4.0–10.5)

## 2018-05-17 LAB — BASIC METABOLIC PANEL
Anion gap: 15 (ref 5–15)
BUN: 106 mg/dL — AB (ref 6–20)
CALCIUM: 9.4 mg/dL (ref 8.9–10.3)
CHLORIDE: 95 mmol/L — AB (ref 101–111)
CO2: 19 mmol/L — ABNORMAL LOW (ref 22–32)
Creatinine, Ser: 2.83 mg/dL — ABNORMAL HIGH (ref 0.44–1.00)
GFR calc Af Amer: 18 mL/min — ABNORMAL LOW (ref >60)
GFR calc non Af Amer: 15 mL/min — ABNORMAL LOW (ref >60)
Glucose, Bld: 291 mg/dL — ABNORMAL HIGH (ref 65–99)
Potassium: 4 mmol/L (ref 3.5–5.1)
SODIUM: 129 mmol/L — AB (ref 135–145)

## 2018-05-17 LAB — GLUCOSE, CAPILLARY
GLUCOSE-CAPILLARY: 247 mg/dL — AB (ref 65–99)
GLUCOSE-CAPILLARY: 191 mg/dL — AB (ref 65–99)
GLUCOSE-CAPILLARY: 323 mg/dL — AB (ref 65–99)
GLUCOSE-CAPILLARY: 265 mg/dL — AB (ref 65–99)
Glucose-Capillary: 269 mg/dL — ABNORMAL HIGH (ref 65–99)
Glucose-Capillary: 313 mg/dL — ABNORMAL HIGH (ref 65–99)

## 2018-05-17 LAB — MAGNESIUM: MAGNESIUM: 2.1 mg/dL (ref 1.7–2.4)

## 2018-05-17 MED ORDER — GABAPENTIN 100 MG PO CAPS
200.0000 mg | ORAL_CAPSULE | Freq: Once | ORAL | Status: AC
  Administered 2018-05-17: 200 mg via ORAL
  Filled 2018-05-17: qty 2

## 2018-05-17 MED ORDER — GABAPENTIN 300 MG PO CAPS
300.0000 mg | ORAL_CAPSULE | Freq: Three times a day (TID) | ORAL | Status: DC
  Administered 2018-05-17 – 2018-05-19 (×7): 300 mg via ORAL
  Filled 2018-05-17 (×7): qty 1

## 2018-05-17 MED ORDER — SODIUM CHLORIDE 0.9 % IV SOLN
2.0000 g | INTRAVENOUS | Status: DC
  Administered 2018-05-17 – 2018-05-22 (×5): 2 g via INTRAVENOUS
  Filled 2018-05-17 (×6): qty 20

## 2018-05-17 NOTE — Consult Note (Addendum)
[facility] for Infectious Disease    Date of Admission:  05/12/2018           Day 4 vancomycin        Day 4 piperacillin tazobactam       Reason for Consult: Lumbar discitis    Referring Provider: Dr. Dia Crawford  Assessment: She has lumbar discitis with negative aspirate cultures likely due to 48 hours of antibiotics before the aspirate was obtained.  I will continue vancomycin and narrow piperacillin tazobactam to ceftriaxone.  She will need at least 6 weeks of IV antibiotic therapy.  She has CKD so we will need to have interventional radiology place a central line.  Plan: 1. Continue vancomycin 2. Change piperacillin tazobactam to ceftriaxone 3. Ask IR to place central line 4. Sed rate and C-reactive protein  Diagnosis: Lumbar discitis  Culture Result: Negative  Allergies  Allergen Reactions  . Codeine Other (See Comments)    Increases Pain and couldn't sleep    OPAT Orders Discharge antibiotics: Per pharmacy protocol vancomycin and ceftriaxone Aim for Vancomycin trough 15-20 (unless otherwise indicated) Duration: 6 weeks End Date: 06/24/2018  [facility] Care Per Protocol:  Labs weekly while on IV antibiotics: _x_ CBC with differential _x_ BMP __ CMP _x_ CRP _x_ ESR _x_ Vancomycin trough  __ Please pull PIC at completion of IV antibiotics _x_ Please leave PIC in place until doctor has seen patient or been notified  Fax weekly labs to [phone]  Clinic Follow Up Appt: We will arrange clinic follow-up   Principal Problem:   Lumbar discitis Active Problems:   Mobitz type 2 second degree AV block   CKD (chronic kidney disease), stage IV (HCC)   Diet-controlled diabetes mellitus (HCC)   GERD (gastroesophageal reflux disease)   Gout   High cholesterol   Hypothyroidism   Essential hypertension   Blood loss anemia   Scheduled Meds: . acetaminophen  650 mg Oral Q6H  . allopurinol  200 mg Oral Daily  . dexamethasone  4 mg Intravenous  Q12H  . gabapentin  300 mg Oral TID  . insulin aspart  0-20 Units Subcutaneous Q4H  . insulin glargine  8 Units Subcutaneous Daily  . levothyroxine  75 mcg Oral QAC breakfast  . pantoprazole  40 mg Intravenous Q12H  . polyethylene glycol  17 g Oral BID  . simvastatin  40 mg Oral QHS  . sodium chloride flush  3 mL Intravenous Q12H   Continuous Infusions: . sodium chloride 75 mL/hr at 05/16/18 2320  . piperacillin-tazobactam (ZOSYN)  IV 2.25 g (05/17/18 1127)  . vancomycin Stopped (05/15/18 2327)   PRN Meds:.methocarbamol, ondansetron **OR** ondansetron (ZOFRAN) IV, oxyCODONE  HPI: Erika Fry is a 72 y.o. female who recently had the onset of severe low back pain radiating into her right hip and leg.  Admitted to the hospital from 04/29/2018 to 05/05/2017.  She just recently had a pacemaker placed for symptomatic type II heart block and bradycardia so an MRI was not done.  She has chronic kidney disease so they did not do a CT with contrast.  She was treated with steroids and discharged to a skilled nursing facility.  She was readmitted on 05/12/2018 with persistent pain.  Lumbar MRI was done which revealed L4-5 discitis with 11 x 19 mm paraspinous fluid collection.  She was started on empiric antibiotics.  Her lumbar aspirate was delayed until 05/15/2018.  4 cc of thick bloody fluid was  aspirated. No organisms were seen on Gram stain.  Cultures are negative at 48 hours.   Review of Systems: Review of Systems  Constitutional: Positive for malaise/fatigue. Negative for chills, diaphoresis and fever.  Musculoskeletal: Positive for back pain.    Past Medical History:  Diagnosis Date  . Arthritis    "knees, thumbs" (03/25/2018)  . CKD (chronic kidney disease), stage IV ([place])   . Diet-controlled diabetes mellitus ([place])   . GERD (gastroesophageal reflux disease)   . Gout    "on daily RX" (03/25/2018)  . Heart murmur   . High cholesterol   . Hypertension   . Hypothyroidism   . Iron  deficiency anemia    "had to get an iron infusion"  . Migraine    "used to have them growing up" (03/25/2018)  . Presence of permanent cardiac pacemaker 03/25/2018    Social History   Tobacco Use  . Smoking status: Never Smoker  . Smokeless tobacco: Never Used  Substance Use Topics  . Alcohol use: Not Currently  . Drug use: Never    Family History  Problem Relation Age of Onset  . Hypertension Mother   . Diabetes Mellitus II Father   . Heart disease Father   . Gastric cancer Brother   . Diabetes Mellitus II Brother    Allergies  Allergen Reactions  . Codeine Other (See Comments)    Increases Pain and couldn't sleep    OBJECTIVE: Blood pressure (!) 133/94, pulse 76, temperature 97.8 F (36.6 C), temperature source Oral, resp. rate 18, height '5\' 5"'$  (1.651 m), weight 211 lb 10.3 oz (96 kg), SpO2 98 %.  Physical Exam  Constitutional: She is oriented to person, place, and time.  She is alert and pleasant resting quietly in bed.  Cardiovascular: Normal rate and regular rhythm.  Murmur heard. Harsh, 2/6 systolic murmur heard best at the left upper sternal border.  Left anterior chest pacemaker site appears normal.  Pulmonary/Chest: Effort normal and breath sounds normal.  Abdominal: Soft. She exhibits no distension. There is no tenderness.  Neurological: She is alert and oriented to person, place, and time.  Skin: No rash noted.  Psychiatric: She has a normal mood and affect.    Lab Results Lab Results  Component Value Date   WBC 8.0 05/17/2018   HGB 10.7 (L) 05/17/2018   HCT 32.2 (L) 05/17/2018   MCV 88.2 05/17/2018   PLT 237 05/17/2018    Lab Results  Component Value Date   CREATININE 2.83 (H) 05/17/2018   BUN 106 (H) 05/17/2018   NA 129 (L) 05/17/2018   K 4.0 05/17/2018   CL 95 (L) 05/17/2018   CO2 19 (L) 05/17/2018    Lab Results  Component Value Date   ALT 11 (L) 05/13/2018   AST 22 05/13/2018   ALKPHOS 62 05/13/2018   BILITOT 1.1 05/13/2018      Microbiology: Recent Results (from the past 240 hour(s))  Aerobic/Anaerobic Culture (surgical/deep wound)     Status: None (Preliminary result)   Collection Time: 05/15/18  3:09 PM  Result Value Ref Range Status   Specimen Description ABSCESS  Final   Special Requests LUMBAR ASPIRATE  Final   Gram Stain   Final    FEW WBC PRESENT,BOTH PMN AND MONONUCLEAR NO ORGANISMS SEEN    Culture   Final    NO GROWTH < 24 HOURS Performed at [facility], [address], [place], [place] [address]    Report Status PENDING  Incomplete  Michel Bickers, MD [facility] [phone] pager   [phone] cell 05/17/2018, 11:49 AM

## 2018-05-17 NOTE — Evaluation (Addendum)
Physical Therapy Evaluation Patient Details Name: Erika Fry MRN: [id number] DOB: 03/12/1946 Today's Date: 05/17/2018   History of Present Illness  Pt adm from SNF with fatigue and weakness. Pt found to have acute blood loss anemia and lumbar spine acute osteomyelitis/discitis and rt psoas abcess. Pt with admit to [facility] 5/29-6/4 after fall with back pain. PMH - HTN, Pacer 4/19, ckd, dm, aortic stenosis  Clinical Impression  Pt presents to PT with severe mobility impairment due to pain and weakness. Pt with multiple ongoing medical issues.  Expect pt to make slow progress. Recommend LTACH for further rehab.    Follow Up Recommendations LTACH    Equipment Recommendations  Wheelchair (measurements PT);Rolling walker with 5" wheels    Recommendations for Other Services       Precautions / Restrictions Precautions Precautions: Fall Restrictions Weight Bearing Restrictions: No      Mobility  Bed Mobility Overal bed mobility: Needs Assistance Bed Mobility: Rolling;Sidelying to Sit;Sit to Sidelying Rolling: Mod assist Sidelying to sit: Max assist     Sit to sidelying: Total assist General bed mobility comments: Assist to move legs, elevate trunk into sitting and bring hips to EOB. Assist to lower trunk and bring legs back up into bed.  Transfers                 General transfer comment: Attempted to stand from edge of elevated bed but pt unable with 1 person assist.  Ambulation/Gait                Stairs            Wheelchair Mobility    Modified Rankin (Stroke Patients Only)       Balance                                             Pertinent Vitals/Pain Pain Assessment: Faces Faces Pain Scale: Hurts whole lot Pain Location: rt hip and rt low back  Pain Descriptors / Indicators: Sharp;Grimacing;Guarding Pain Intervention(s): Limited activity within patient's tolerance;Monitored during session    [facility] expects to be discharged to:: Private residence Living Arrangements: Alone Available Help at Discharge: Family;Available PRN/intermittently Type of Home: House Home Access: Stairs to enter Entrance Stairs-Rails: None Entrance Stairs-Number of Steps: 2 Home Layout: Two level;Able to live on main level with bedroom/bathroom Home Equipment: None      Prior Function Level of Independence: Needs assistance   Gait / Transfers Assistance Needed: Independent prior to admission at the end of May after fall. Since then pt at most has amb very short (15') distance with walker and 2 person assist.           Hand Dominance        Extremity/Trunk Assessment   Upper Extremity Assessment Upper Extremity Assessment: Defer to OT evaluation    Lower Extremity Assessment Lower Extremity Assessment: RLE deficits/detail;LLE deficits/detail RLE Deficits / Details: grossly 2+ to 3-/5 LLE Deficits / Details: grossly 2+ to 3-/5       Communication   Communication: No difficulties  Cognition Arousal/Alertness: Awake/alert Behavior During Therapy: WFL for tasks assessed/performed Overall Cognitive Status: Within Functional Limits for tasks assessed  General Comments      Exercises General Exercises - Lower Extremity Ankle Circles/Pumps: AROM;Both;10 reps;Supine Short Arc Quad: AAROM;Both;10 reps;Seated Heel Slides: AAROM;Both;10 reps;Supine Hip ABduction/ADduction: AAROM;Both;10 reps;Supine   Assessment/Plan    PT Assessment Patient needs continued PT services  PT Problem List Decreased strength;Decreased activity tolerance;Decreased balance;Decreased mobility;Decreased knowledge of use of DME;Pain;Obesity       PT Treatment Interventions DME instruction;Gait training;Functional mobility training;Therapeutic activities;Therapeutic exercise;Patient/family education;Balance training    PT Goals (Current goals can  be found in the Care Plan section)  Acute Rehab PT Goals Patient Stated Goal: Be able to walk PT Goal Formulation: With patient Time For Goal Achievement: 05/31/18 Potential to Achieve Goals: Fair    Frequency Min 3X/week   Barriers to discharge        Co-evaluation               AM-PAC PT "6 Clicks" Daily Activity  Outcome Measure Difficulty turning over in bed (including adjusting bedclothes, sheets and blankets)?: Unable Difficulty moving from lying on back to sitting on the side of the bed? : Unable Difficulty sitting down on and standing up from a chair with arms (e.g., wheelchair, bedside commode, etc,.)?: Unable Help needed moving to and from a bed to chair (including a wheelchair)?: Total Help needed walking in hospital room?: Total Help needed climbing 3-5 steps with a railing? : Total 6 Click Score: 6    End of Session Equipment Utilized During Treatment: Gait belt Activity Tolerance: Patient limited by pain Patient left: in bed;with call bell/phone within reach;with bed alarm set Nurse Communication: Mobility status PT Visit Diagnosis: Other abnormalities of gait and mobility (R26.89);History of falling (Z91.81);Muscle weakness (generalized) (M62.81);Pain Pain - Right/Left: Right Pain - part of body: Hip(and low back)    Time: 2010-0712 PT Time Calculation (min) (ACUTE ONLY): 36 min   Charges:   PT Evaluation $PT Eval Moderate Complexity: 1 Mod PT Treatments $Therapeutic Activity: 8-22 mins   PT G CodesMarland Kitchen        [facility] [place] 05/17/2018, 10:38 AM

## 2018-05-17 NOTE — Progress Notes (Signed)
Orthopedic Tech Progress Note Patient Details:  Erika Fry 11-29-1946 [id number]  Patient ID: Erika Fry, female   DOB: 12/18/1945, 72 y.o.   MRN: [id number]   Erika Fry 05/17/2018, 11:16 AMCalled [employer] for LSO brace.

## 2018-05-17 NOTE — Progress Notes (Addendum)
PHARMACY CONSULT NOTE FOR:  OUTPATIENT  PARENTERAL ANTIBIOTIC THERAPY (OPAT)  Indication: Lumbar Discitis  Regimen:  Ceftriaxone 2g once daily Vancomycin 1,000mg  every 48 hours (Goal Trough 15-20)  End date: 06/24/2018  IV antibiotic discharge orders are pended. To discharging provider:  please sign these orders via discharge navigator,  Select New Orders & click on the button choice - Manage This Unsigned Work.     Thank you for allowing pharmacy to be a part of this patient's care.   Erika Fry L. Kyung Rudd, PharmD, [place] PGY1 Pharmacy Resident

## 2018-05-17 NOTE — Progress Notes (Signed)
PROGRESS NOTE    Erika Fry  WFU:[id number] DOB: 05-Nov-1946 DOA: 05/12/2018 PCP: [name], MD   Brief Narrative:  72 y.o. WF PMHx  second-degree AV block S/P permanent pacemaker placement on 04/07/2018, HTN, CKD stage IV, Hypothyroidism, Diabetes type 2, Iron Deficiency anemia   Presents to the ED [facility],  she was discharged to home on 6/4 with fatigue, weakness, poor appetite, and nausea.  Patient was admitted from 5/29 through 6/4 after mechanical fall which resulted in severe back pain and difficulty ambulating.  MRI was not obtained due to recent placement of PPM, and CT myelogram close contra indicated due to the patient's baseline CKD.  She was started on a steroid Dosepak and discharged to SNF.  Although she continues to have back pain, she is also began to experience the aforementioned fatigue and weakness.  She states that she has been constipated, and is not sure of the color of her stools.  She denies the presence of frank blood in her stools.  She states that she has been nauseated without emesis.  No hemoptysis, no hematuria, no significant bruising, no new rashes.  She denies abdominal pain.  She is not on any blood thinners or antiplatelet agents.  She denies recent fever, chills, cough, shortness of breath, diarrhea, dysuria.   ED Course: In the ED, the patient is afebrile, mildly hypertensive at 90/55 and saturating comfortably on room air.  Labs are notable for WBC 22.6 (was 20.9 on 6/4), hemoglobin 4.1 (was 8.3 on 6/4), platelets 305, NA 130, BUN 158, creatinine 3.62.  Patient received 500 cc normal saline bolus, Protonix 80 mg IV x1 and 2 units of packed red cells while in the ED.  Critical care was called and felt the patient was appropriate for admission to the hospitalist service.  Gastroneurology was also called, awaiting recommendations.    Subjective: 6/16 A/4, negative CP, negative abdominal pain. Neg SOB. Reportedly PT today states only able to  barely stand with significant help, LSO brace in place.   Assessment & Plan:   Active Problems:   Blood loss anemia  Acute blood loss anemia/upper GI bleed -Patient seen by [facility] on 6/12 recommend EGD. -6/16 discussed case with Dr. Herbie Saxon state given patient's back issues and affect her bleeding appears to be stabilized unsure if they would proceed with colonoscopy. However thought they might proceed with the EGD. Recommend we officially reconsult Dr. [facility] who will be on this upcoming week. Patient will have received 7+ days of antibiotics  -Protonix 40 mg BID  - Hold antiplatelet/anticoagulants - Transfuse: See CHF  -Hemolysis labs pending, anemia panel pending Recent Labs  Lab 05/12/18 1827 05/13/18 0427 05/13/18 1547 05/14/18 1151 05/15/18 0652 05/16/18 0517 05/17/18 0205  HGB 4.1* 6.9* 8.2* 7.5* 7.3* 9.8* 10.7*   Leukocytosis -Leukocytosis most likely explained by discitis/RIGHT psoas abscess.  Awaiting findings of aspiration which has been delayed until 6/14.  Per IR will not be able to obtain fluid from abscess (insignificant amount) but will obtain disc fluid/biopsy  -Afebrile last 24 hours  -resolved    CKD stage IV (baseline Cr= 3.0-4.0) Recent Labs  Lab 05/12/18 1827 05/13/18 0545 05/14/18 0636 05/15/18 0652 05/16/18 0517 05/17/18 0205  CREATININE 3.62* 3.54* 3.31* 3.03* 2.80* 2.83*  -Baseline - Avoid nephrotoxins - continues to improve except for slight uptake today in creatinine.  Hypovolemic hyponatremia - Normal saline 54ml/hr Recent Labs  Lab 05/12/18 1827 05/13/18 0545 05/14/18 0636 05/15/18 2025  05/16/18 0517 05/17/18 0205  NA 130* 131* 136 131* 131* 129*  -Mostly resolved  CHF - Echocardiogram 03/20/2018: Hyperdynamic LV with severe basal septal hypertrophy and LVOT obstruction. -Strict in and out - Daily weight - Transfuse hemoglobin<8 -6/11 transfused 2 units PRBC  -6/12 transfuse 1 unit PRBC -6/13  transfuse 1 unit PRBC -6/14 transfuse 2 units PRBC  Second degree AV block, Mobitz II - s/p PPM placement - Monitor on telemetry   Low back pain, subsequent encounter/RIGHT LOWER extremity sciatica -Robaxin 500 mg TID -Decadron 10 mg x 1 -6/15 decrease Decadron 4 mg BID  - 6/16 300 mg TID   Acute discitis/osteomyelitis -L-spine MRI concerning for acute osteomyelitis/discitis in addition phlegmon vs abscess - Begin empiric antibiotics: Vancomycin + Zosyn - Decadron - 6/13 IR unable to drain RIGHT psoas muscle paraspinous fluid collection. -6/14 S/P aspiration L4/5 see below.  Await labs. - 6/16 repeat blood cultures from 6/15 probably low yield. -ID recommended changes to antibiotics. Patient will require 6 weeks of IV antibiotics -PICC line placement requested. Nothing by mouth after midnight  Lumbar nerve root impingement -See low back pain -See acute discitis -6/16 discussed case with Dr. Purcell Mouton  Neurosurgery concurs there is no surgical intervention warranted at this point. Stated that in a lot of cases want infection/inflammation is handled the bone perfused and pain will resolve. If patient's pain does not resolve post completion of antibiotics patient could be seen for evaluation of surgical intervention. -Concurs with placement of LSO brace.   HypoThyroidism -Synthroid 75 g daily  Diabetes type 2 controlled with complication - 6/80 hemoglobin A1c= 6.3 -6/15 Lantus 8 units daily -resistant SSI    Goals of care -PT/OT consult: Patient with acute discitis/osteomyelitis, ongoing GI bleed evaluate for LTAC     DVT prophylaxis: SCD Code Status: Full Family Communication: None Disposition Plan: SNF vs home with palliative care   Consultants:  [facility] IR Phone consult Dr. Purcell Mouton  Neurosurgery  ID    Procedures/Significant Events:  6/12 L-spine MRI:-Findings concerning for acute osteomyelitis discitis involving the right aspect of the L4-5    -paraspinous edema within the adjacent right psoas muscle with superimposed 11 x 19 mm paraspinous fluid collection. Probable phlegmon within the right L4-5 neural foramen without frank epidural abscess or other collection. - Superimposed multifactorial degenerative changes at L4-5 with resultant moderate to severe right with moderate left L4 foraminal stenosis. - Small left foraminal disc protrusion at L2-3, contacting the exiting left L2 nerve root in the left neural foramen. -Shallow right foraminal/extraforaminal disc protrusion at L3-4,contacting the exiting right L3 nerve root as it courses out of the right neural foramen. 6/14 IR guided needle aspiration/injection L4/L5: Aspiration 4 cc thick bloody aspirate     I have personally reviewed and interpreted all radiology studies and my findings are as above.  VENTILATOR SETTINGS:    Cultures 6/14 lumbar aspirate NGTD 6/14 CSF pending 6/15 blood pending      Antimicrobials:  Anti-infectives (From admission, onward)   Start     Stop   05/17/18 1300  cefTRIAXone (ROCEPHIN) 2 g in sodium chloride 0.9 % 100 mL IVPB         05/15/18 2300  piperacillin-tazobactam (ZOSYN) IVPB 2.25 g  Status:  Discontinued     05/17/18 1205   05/15/18 2200  vancomycin (VANCOCIN) IVPB 1000 mg/200 mL premix         05/14/18 0300  piperacillin-tazobactam (ZOSYN) IVPB 2.25 g  Status:  Discontinued  05/15/18 1846   05/13/18 2000  vancomycin (VANCOCIN) 1,750 mg in sodium chloride 0.9 % 500 mL IVPB     05/14/18 0015   05/13/18 2000  piperacillin-tazobactam (ZOSYN) IVPB 3.375 g     05/13/18 2342      Devices    LINES / TUBES:      Continuous Infusions: . sodium chloride 75 mL/hr at 05/16/18 2320  . piperacillin-tazobactam (ZOSYN)  IV Stopped (05/17/18 0500)  . vancomycin Stopped (05/15/18 2327)     Objective: Vitals:   05/17/18 0010 05/17/18 0410 05/17/18 0418 05/17/18 0814  BP: 126/71 129/67    Pulse: 72 (!) 59    Resp: 13 14     Temp: 97.7 F (36.5 C) 97.7 F (36.5 C) 97.6 F (36.4 C) 97.8 F (36.6 C)  TempSrc: Oral Oral  Oral  SpO2: 95% 93%    Weight:   211 lb 10.3 oz (96 kg)   Height:        Intake/Output Summary (Last 24 hours) at 05/17/2018 0825 Last data filed at 05/17/2018 0805 Gross per 24 hour  Intake 3048.75 ml  Output 1550 ml  Net 1498.75 ml   Filed Weights   05/14/18 0654 05/15/18 0500 05/17/18 0418  Weight: 199 lb 8.3 oz (90.5 kg) 199 lb 9.6 oz (90.5 kg) 211 lb 10.3 oz (96 kg)    Physical Exam:  General: A/O x4, No acute respiratory distress Lungs: Clear to auscultation bilaterally without wheezes or crackles Cardiovascular: Regular rate and rhythm (paced) without murmur gallop or rub normal S1 and S2 Abdomen: negative abdominal pain, nondistended, positive soft, bowel sounds, no rebound, no ascites, no appreciable mass Extremities: No significant cyanosis, clubbing, or edema bilateral lower extremities Skin: Negative rashes, lesions, ulcers Psychiatric:  Negative depression, negative anxiety, negative fatigue, negative mania  Central nervous system:  Cranial nerves II through XII intact, tongue/uvula midline, all extremities muscle strength 5/5 except LLE strength 4/5, sensation intact throughout,  negative dysarthria, negative expressive aphasia, negative receptive aphasia.  Pain to palpation right lateral hip .     Data Reviewed: Care during the described time interval was provided by me .  I have reviewed this patient's available data, including medical history, events of note, physical examination, and all test results as part of my evaluation.   CBC: Recent Labs  Lab 05/12/18 1827 05/13/18 0427 05/13/18 1547 05/14/18 1151 05/15/18 0652 05/16/18 0517 05/17/18 0205  WBC 22.6* 21.4*  --  12.0* 9.0 8.6 8.0  NEUTROABS 19.2*  --   --  11.3*  --   --   --   HGB 4.1* 6.9* 8.2* 7.5* 7.3* 9.8* 10.7*  HCT 12.6* 20.0*  20.1* 24.2* 22.1* 21.7* 28.8* 32.2*  MCV 90.0 88.1  --  87.4  87.5 86.5 88.2  PLT 305 290  --  288 251 234 283   Basic Metabolic Panel: Recent Labs  Lab 05/13/18 0427 05/13/18 0545 05/14/18 0636 05/15/18 0652 05/16/18 0517 05/17/18 0205  NA  --  131* 136 131* 131* 129*  K  --  3.9 4.4 3.6 4.0 4.0  CL  --  96* 97* 99* 98* 95*  CO2  --  20* 20* 21* 22 19*  GLUCOSE  --  157* 214* 230* 238* 291*  BUN  --  157* 131* 122* 113* 106*  CREATININE  --  3.54* 3.31* 3.03* 2.80* 2.83*  CALCIUM  --  9.2 9.6 9.4 9.5 9.4  MG 2.2  --  2.4 2.4 2.4 2.1  PHOS  7.0*  --   --   --   --   --    GFR: Estimated Creatinine Clearance: 20 mL/min (A) (by C-G formula based on SCr of 2.83 mg/dL (H)). Liver Function Tests: Recent Labs  Lab 05/13/18 0427  AST 22  ALT 11*  ALKPHOS 62  BILITOT 1.1  PROT 5.7*  ALBUMIN 2.4*   No results for input(s): LIPASE, AMYLASE in the last 168 hours. No results for input(s): AMMONIA in the last 168 hours. Coagulation Profile: Recent Labs  Lab 05/13/18 0427 05/14/18 1151  INR 1.01 1.01   Cardiac Enzymes: No results for input(s): CKTOTAL, CKMB, CKMBINDEX, TROPONINI in the last 168 hours. BNP (last 3 results) No results for input(s): PROBNP in the last 8760 hours. HbA1C: No results for input(s): HGBA1C in the last 72 hours. CBG: Recent Labs  Lab 05/16/18 1709 05/16/18 2029 05/17/18 0013 05/17/18 0416 05/17/18 0813  GLUCAP 297* 287* 313* 247* 191*   Lipid Profile: No results for input(s): CHOL, HDL, LDLCALC, TRIG, CHOLHDL, LDLDIRECT in the last 72 hours. Thyroid Function Tests: No results for input(s): TSH, T4TOTAL, FREET4, T3FREE, THYROIDAB in the last 72 hours. Anemia Panel: No results for input(s): VITAMINB12, FOLATE, FERRITIN, TIBC, IRON, RETICCTPCT in the last 72 hours. Urine analysis:    Component Value Date/Time   COLORURINE YELLOW 05/13/2018 0140   APPEARANCEUR HAZY (A) 05/13/2018 0140   LABSPEC 1.013 05/13/2018 0140   PHURINE 5.0 05/13/2018 0140   GLUCOSEU NEGATIVE 05/13/2018 0140   HGBUR SMALL  (A) 05/13/2018 0140   BILIRUBINUR NEGATIVE 05/13/2018 0140   KETONESUR NEGATIVE 05/13/2018 0140   PROTEINUR NEGATIVE 05/13/2018 0140   NITRITE NEGATIVE 05/13/2018 0140   LEUKOCYTESUR SMALL (A) 05/13/2018 0140   Sepsis Labs: @LABRCNTIP (procalcitonin:4,lacticidven:4)  ) Recent Results (from the past 240 hour(s))  Aerobic/Anaerobic Culture (surgical/deep wound)     Status: None (Preliminary result)   Collection Time: 05/15/18  3:09 PM  Result Value Ref Range Status   Specimen Description ABSCESS  Final   Special Requests LUMBAR ASPIRATE  Final   Gram Stain   Final    FEW WBC PRESENT,BOTH PMN AND MONONUCLEAR NO ORGANISMS SEEN    Culture   Final    NO GROWTH < 24 HOURS Performed at [facility], [place] [address] [place], [place] [address]    Report Status PENDING  Incomplete         Radiology Studies: No results found.      Scheduled Meds: . acetaminophen  650 mg Oral Q6H  . allopurinol  200 mg Oral Daily  . dexamethasone  4 mg Intravenous Q12H  . gabapentin  100 mg Oral TID  . insulin aspart  0-20 Units Subcutaneous Q4H  . insulin glargine  8 Units Subcutaneous Daily  . levothyroxine  75 mcg Oral QAC breakfast  . pantoprazole  40 mg Intravenous Q12H  . polyethylene glycol  17 g Oral BID  . simvastatin  40 mg Oral QHS  . sodium chloride flush  3 mL Intravenous Q12H   Continuous Infusions: . sodium chloride 75 mL/hr at 05/16/18 2320  . piperacillin-tazobactam (ZOSYN)  IV Stopped (05/17/18 0500)  . vancomycin Stopped (05/15/18 2327)     LOS: 5 days    Time spent: 40 minutes    WOODS, Geraldo Docker, MD [facility] Pager [phone]   If 7PM-7AM, please contact night-coverage [email or web address] Password [email or web address] 05/17/2018, 8:25 AM

## 2018-05-18 ENCOUNTER — Inpatient Hospital Stay (HOSPITAL_COMMUNITY): Payer: Medicare Other

## 2018-05-18 DIAGNOSIS — D62 Acute posthemorrhagic anemia: Secondary | ICD-10-CM

## 2018-05-18 DIAGNOSIS — E871 Hypo-osmolality and hyponatremia: Secondary | ICD-10-CM

## 2018-05-18 HISTORY — PX: IR US GUIDE VASC ACCESS RIGHT: IMG2390

## 2018-05-18 HISTORY — PX: IR FLUORO GUIDE CV LINE RIGHT: IMG2283

## 2018-05-18 LAB — CBC
HCT: 30.1 % — ABNORMAL LOW (ref 36.0–46.0)
Hemoglobin: 10.2 g/dL — ABNORMAL LOW (ref 12.0–15.0)
MCH: 29.7 pg (ref 26.0–34.0)
MCHC: 33.9 g/dL (ref 30.0–36.0)
MCV: 87.8 fL (ref 78.0–100.0)
PLATELETS: 197 10*3/uL (ref 150–400)
RBC: 3.43 MIL/uL — AB (ref 3.87–5.11)
RDW: 15.9 % — ABNORMAL HIGH (ref 11.5–15.5)
WBC: 8.6 10*3/uL (ref 4.0–10.5)

## 2018-05-18 LAB — BASIC METABOLIC PANEL
ANION GAP: 10 (ref 5–15)
BUN: 86 mg/dL — ABNORMAL HIGH (ref 6–20)
CALCIUM: 9.5 mg/dL (ref 8.9–10.3)
CO2: 21 mmol/L — ABNORMAL LOW (ref 22–32)
Chloride: 101 mmol/L (ref 101–111)
Creatinine, Ser: 2.51 mg/dL — ABNORMAL HIGH (ref 0.44–1.00)
GFR, EST AFRICAN AMERICAN: 21 mL/min — AB (ref >60)
GFR, EST NON AFRICAN AMERICAN: 18 mL/min — AB (ref >60)
Glucose, Bld: 223 mg/dL — ABNORMAL HIGH (ref 65–99)
Potassium: 4.3 mmol/L (ref 3.5–5.1)
SODIUM: 132 mmol/L — AB (ref 135–145)

## 2018-05-18 LAB — C-REACTIVE PROTEIN: CRP: 1.7 mg/dL — ABNORMAL HIGH (ref <1.0)

## 2018-05-18 LAB — GLUCOSE, CAPILLARY
GLUCOSE-CAPILLARY: 166 mg/dL — AB (ref 65–99)
GLUCOSE-CAPILLARY: 125 mg/dL — AB (ref 65–99)
Glucose-Capillary: 131 mg/dL — ABNORMAL HIGH (ref 65–99)
Glucose-Capillary: 172 mg/dL — ABNORMAL HIGH (ref 65–99)
Glucose-Capillary: 278 mg/dL — ABNORMAL HIGH (ref 65–99)
Glucose-Capillary: 309 mg/dL — ABNORMAL HIGH (ref 65–99)

## 2018-05-18 LAB — MAGNESIUM: Magnesium: 2.1 mg/dL (ref 1.7–2.4)

## 2018-05-18 LAB — SEDIMENTATION RATE: SED RATE: 23 mm/h — AB (ref 0–22)

## 2018-05-18 MED ORDER — LIDOCAINE HCL 1 % IJ SOLN
INTRAMUSCULAR | Status: AC
  Filled 2018-05-18: qty 20

## 2018-05-18 MED ORDER — FENTANYL CITRATE (PF) 100 MCG/2ML IJ SOLN: 50.0000 ug | Freq: Once | INTRAMUSCULAR | Status: DC

## 2018-05-18 MED ORDER — FENTANYL CITRATE (PF) 100 MCG/2ML IJ SOLN
INTRAMUSCULAR | Status: AC
  Filled 2018-05-18: qty 2

## 2018-05-18 MED ORDER — PANTOPRAZOLE SODIUM 40 MG PO TBEC
40.0000 mg | DELAYED_RELEASE_TABLET | Freq: Every day | ORAL | Status: DC
  Administered 2018-05-20 – 2018-05-22 (×3): 40 mg via ORAL
  Filled 2018-05-18 (×4): qty 1

## 2018-05-18 MED ORDER — INSULIN GLARGINE 100 UNIT/ML ~~LOC~~ SOLN
12.0000 [IU] | Freq: Every day | SUBCUTANEOUS | Status: DC
  Administered 2018-05-20: 12 [IU] via SUBCUTANEOUS
  Filled 2018-05-18 (×3): qty 0.12

## 2018-05-18 MED ORDER — LIDOCAINE HCL (PF) 1 % IJ SOLN
INTRAMUSCULAR | Status: DC | PRN
  Administered 2018-05-18: 5 mL

## 2018-05-18 NOTE — Progress Notes (Signed)
Inpatient Diabetes Program Recommendations  AACE/ADA: New Consensus Statement on Inpatient Glycemic Control (2019)  Target Ranges:  Prepandial:   less than 140 mg/dL      Peak postprandial:   less than 180 mg/dL (1-2 hours)      Critically ill patients:  140 - 180 mg/dL   Results for Erika Fry, Erika Fry (MRN [id number]) as of 05/18/2018 09:44  Ref. Range 05/17/2018 04:16 05/17/2018 08:13 05/17/2018 11:46 05/17/2018 16:51 05/17/2018 19:57 05/18/2018 00:16 05/18/2018 04:26 05/18/2018 07:52  Glucose-Capillary Latest Ref Range: 65 - 99 mg/dL 247 (H) 191 (H) 269 (H) 323 (H) 265 (H) 278 (H) 131 (H) 172 (H)   Review of Glycemic Control  Diabetes history: DM2 (diet controlled) Outpatient Diabetes medications: None Current orders for Inpatient glycemic control: Lantus 8 units daily, Novolog 0-20 units Q4H; Decadron 4 mg Q12H  Inpatient Diabetes Program Recommendations: Insulin - Basal: Please consider increasing Lantus to 12 units daily. Insulin - Meal Coverage: If Decadron is continued and once diet resumed, please consider ordering Novolog 4 units TID with meals for meal coverage if patient east at least 50% of meals.  Thanks, Barnie Alderman, RN, MSN, CDE Diabetes Coordinator Inpatient Diabetes Program [phone] (Team Pager from 8am to 5pm)

## 2018-05-18 NOTE — Progress Notes (Signed)
   Patient Status: [facility] - In-pt  Assessment and Plan: Patient in need of venous access.   Tunneled central catheter placement  ______________________________________________________________________   History of Present Illness: Erika Fry is a 71 y.o. female   Lumbar discitis Need for access for 6 week antibiotic course per ID CKD Will need tunneled IJ  Allergies and medications reviewed.   Review of Systems: A 12 point ROS discussed and pertinent positives are indicated in the HPI above.  All other systems are negative.   Vital Signs: BP 140/72   Pulse (!) 59   Temp 97.8 F (36.6 C) (Oral)   Resp 13   Ht 5\' 5"  (1.651 m)   Wt 211 lb 10.3 oz (96 kg)   SpO2 96%   BMI 35.22 kg/m   Physical Exam  Constitutional: She is oriented to person, place, and time.  Cardiovascular: Normal rate and regular rhythm.  Pulmonary/Chest: Effort normal and breath sounds normal.  Abdominal: Soft.  Neurological: She is alert and oriented to person, place, and time.  Skin: Skin is warm and dry.  Psychiatric: She has a normal mood and affect. Her behavior is normal. Judgment and thought content normal.  Nursing note and vitals reviewed.    Imaging reviewed.   Labs:  COAGS: Recent Labs    05/13/18 0427 05/14/18 1151  INR 1.01 1.01  APTT 25  --     BMP: Recent Labs    05/15/18 0652 05/16/18 0517 05/17/18 0205 05/18/18 0434  NA 131* 131* 129* 132*  K 3.6 4.0 4.0 4.3  CL 99* 98* 95* 101  CO2 21* 22 19* 21*  GLUCOSE 230* 238* 291* 223*  BUN 122* 113* 106* 86*  CALCIUM 9.4 9.5 9.4 9.5  CREATININE 3.03* 2.80* 2.83* 2.51*  GFRNONAA 14* 16* 15* 18*  GFRAA 16* 18* 18* 21*       Electronically Signed: Marie Chow A, PA-C 05/18/2018, 8:27 AM   I spent a total of 15 minutes in face to face in clinical consultation, greater than 50% of which was counseling/coordinating care for venous access.Patient ID: Erika Fry, female   DOB: September 25, 1946, 72 y.o.    MRN: [id number]

## 2018-05-18 NOTE — Progress Notes (Addendum)
Daily Rounding Note  05/18/2018, 9:22 AM  LOS: 6 days   SUBJECTIVE:   Chief complaint:  Back, hip, leg pain persist.  Not standing yet, using bed pan.  Having regular, darker colored BM's without laxatives , 2 yesterday     OBJECTIVE:         Vital signs in last 24 hours:    Temp:  [97.6 F (36.4 C)-97.8 F (36.6 C)] 97.8 F (36.6 C) (06/17 0754) Pulse Rate:  [59-76] 59 (06/17 0415) Resp:  [13-18] 13 (06/17 0415) BP: (133-154)/(72-94) 140/72 (06/17 0415) SpO2:  [96 %-98 %] 96 % (06/17 0415) Last BM Date: 05/17/18 Filed Weights   05/14/18 0654 05/15/18 0500 05/17/18 0418  Weight: 199 lb 8.3 oz (90.5 kg) 199 lb 9.6 oz (90.5 kg) 211 lb 10.3 oz (96 kg)   General: NAD.  Not ill looking   Heart: RRR Chest: clear bil.  No cough or dyspnea Abdomen: soft, obese, NT.  Active BS  Extremities: no CCE Neuro/Psych:  Oriented x 3.  Moves all 4 limbs without tremor.  strenghth not tested.     Intake/Output from previous day: 06/16 0701 - 06/17 0700 In: 2063.8 [P.O.:480; I.V.:1263.8; IV Piggyback:320] Out: 950 [Urine:950]  Intake/Output this shift: No intake/output data recorded.  Lab Results: Recent Labs    05/16/18 0517 05/17/18 0205 05/18/18 0434  WBC 8.6 8.0 8.6  HGB 9.8* 10.7* 10.2*  HCT 28.8* 32.2* 30.1*  PLT 234 237 197   BMET Recent Labs    05/16/18 0517 05/17/18 0205 05/18/18 0434  NA 131* 129* 132*  K 4.0 4.0 4.3  CL 98* 95* 101  CO2 22 19* 21*  GLUCOSE 238* 291* 223*  BUN 113* 106* 86*  CREATININE 2.80* 2.83* 2.51*  CALCIUM 9.5 9.4 9.5   Scheduled Meds: . acetaminophen  650 mg Oral Q6H  . allopurinol  200 mg Oral Daily  . dexamethasone  4 mg Intravenous Q12H  . gabapentin  300 mg Oral TID  . insulin aspart  0-20 Units Subcutaneous Q4H  . insulin glargine  8 Units Subcutaneous Daily  . levothyroxine  75 mcg Oral QAC breakfast  . pantoprazole  40 mg Intravenous Q12H  . polyethylene glycol   17 g Oral BID  . simvastatin  40 mg Oral QHS  . sodium chloride flush  3 mL Intravenous Q12H   Continuous Infusions: . sodium chloride 75 mL/hr at 05/17/18 1309  . cefTRIAXone (ROCEPHIN)  IV Stopped (05/17/18 1331)  . vancomycin Stopped (05/17/18 2325)   PRN Meds:.methocarbamol, ondansetron **OR** ondansetron (ZOFRAN) IV, oxyCODONE   ASSESMENT:   *   Melenic stool on DRE though patient constipated and last actual bowel movement over 1 week ago.  Not on blood thinners.  Patient denies previous upper endoscopy and colonoscopy.  *   Constipation subjectively and by CT imaging.  Resolved with laxatives.     *   Acute blood loss anemia on top of anemia of chronic kidney disease.  Treated with parenteral iron infusion last year.  On vitamin B12 orally as outpatient. Iron studies do not confirm iron deficiency anemia.  B12 significantly elevated.  *   L4-5  Discitis, disabling pain of back, hip leg.  PICC line placement today to allow for >= 6 weeks of abx.  VAnc and Rocephin in place.     *   Stage 4 CKD.  AKI improved.    *   Hyponatremia.    *  DM2, diet controlled previously, now on insulin as inpt.    *   2:1 heart block.  Status post cardiac pacemaker 03/25/2018.    PLAN   *   EGD tomorrow.  Has never had colonoscopy but prep for this likely to be problematic.     Azucena Freed  05/18/2018, 9:22 AM Phone [phone]     Attending physician's note   I have taken an interval history, reviewed the chart and examined the patient. I agree with the Advanced Practitioner's note, impression and recommendations. EGD tomorrow to further evaluate anemia, heme + stool. Colonoscopy is not possible until she is more mobile and can complete a full bowel prep and can correctly position for colonoscopy - likely as outpatient with Dr. Havery Moros.   Lucio Edward, MD FACG [phone] office

## 2018-05-18 NOTE — Procedures (Signed)
Tunneled RIJV PICC SVC RA EBL 0 Comp 0 

## 2018-05-18 NOTE — Evaluation (Addendum)
Occupational Therapy Evaluation Patient Details Name: Erika Fry MRN: [id number] DOB: 1946/08/17 Today's Date: 05/18/2018    History of Present Illness Pt adm from SNF with fatigue and weakness. Pt found to have acute blood loss anemia and lumbar spine acute osteomyelitis/discitis and rt psoas abcess. Pt with admit to [facility] 5/29-6/4 after fall with back pain. PMH - HTN, Pacer 4/19, ckd, dm, aortic stenosis   Clinical Impression   Pt with decline in function and safety with ADLs and ADL mobility with decreased strength, balance and endurance. Pt this limited by R hip and back pain. Pt requires extensive assist with bed mobility and ADLs. Pt would benefit from acute OT services to address impairments to maximize level of function and safety    Follow Up Recommendations  SNF;Supervision/Assistance - 24 hour    Equipment Recommendations  Other (comment)(TBD at next venue of care)    Recommendations for Other Services       Precautions / Restrictions Precautions Precautions: Fall Precaution Comments: Pacemaker placement 4 weeks ago Restrictions Weight Bearing Restrictions: No      Mobility Bed Mobility Overal bed mobility: Needs Assistance Bed Mobility: Rolling;Sidelying to Sit;Sit to Sidelying Rolling: Mod assist Sidelying to sit: Max assist     Sit to sidelying: Total assist General bed mobility comments: Assist to move legs, elevate trunk into sitting and bring hips to EOB. Assist to lower trunk and bring legs back up into bed.  Transfers Overall transfer level: Needs assistance               General transfer comment: NT, per PT pt will need +2 person assist    Balance Overall balance assessment: Needs assistance Sitting-balance support: Feet supported Sitting balance-Leahy Scale: Poor Sitting balance - Comments: maintains static sitting with close minguard for safety, Poor dynamic sitting. INcreased time and effort. Pt sat EOB x 15 minutes and required max  A to sccot to [facility] before return to supine                                   ADL either performed or assessed with clinical judgement   ADL Overall ADL's : Needs assistance/impaired     Grooming: Sitting;Wash/dry hands;Wash/dry face;Minimal assistance   Upper Body Bathing: Moderate assistance;Sitting   Lower Body Bathing: Total assistance   Upper Body Dressing : Sitting;Moderate assistance   Lower Body Dressing: Total assistance     Toilet Transfer Details (indicate cue type and reason): will need +2 assist Toileting- Clothing Manipulation and Hygiene: Total assistance;Bed level         General ADL Comments: pt continues to have limitations due to pain and weakness, Poor endurance     Vision Baseline Vision/History: Wears glasses Wears Glasses: Reading only Patient Visual Report: No change from baseline       Perception     Praxis      Pertinent Vitals/Pain Pain Assessment: 0-10 Pain Score: 3  Pain Location: rt hip and rt low back  Pain Descriptors / Indicators: Sharp;Grimacing;Guarding;Aching;Sore Pain Intervention(s): Limited activity within patient's tolerance;Monitored during session;Premedicated before session;Repositioned     Hand Dominance Right   Extremity/Trunk Assessment Upper Extremity Assessment Upper Extremity Assessment: Generalized weakness   Lower Extremity Assessment Lower Extremity Assessment: Defer to PT evaluation       Communication Communication Communication: No difficulties   Cognition Arousal/Alertness: Awake/alert Behavior During Therapy: WFL for tasks assessed/performed Overall Cognitive Status: Within Functional  Limits for tasks assessed                                     General Comments       Exercises     Shoulder Instructions      Home Living Family/patient expects to be discharged to:: Private residence Living Arrangements: Alone Available Help at Discharge: Family;Available  PRN/intermittently Type of Home: House   Entrance Stairs-Number of Steps: 2 Entrance Stairs-Rails: None Home Layout: Two level;Able to live on main level with bedroom/bathroom     Bathroom Shower/Tub: Hospital doctor Toilet: Handicapped height     Home Equipment: None          Prior Functioning/Environment    Gait / Transfers Assistance Needed: Independent prior to admission at the end of May after fall. Since then pt at most has amb very short (15') distance with walker and 2 person assist.              OT Problem List: Decreased strength;Decreased activity tolerance;Impaired balance (sitting and/or standing);Decreased knowledge of use of DME or AE;Increased edema;Pain      OT Treatment/Interventions: Self-care/ADL training;DME and/or AE instruction;Therapeutic activities;Patient/family education;Balance training;Therapeutic exercise;Neuromuscular education    OT Goals(Current goals can be found in the care plan section) Acute Rehab OT Goals Patient Stated Goal: Be able to walk OT Goal Formulation: With patient Time For Goal Achievement: 06/01/18 Potential to Achieve Goals: Good ADL Goals Pt Will Perform Grooming: with min guard assist;sitting Pt Will Perform Upper Body Bathing: with min assist;sitting Pt Will Perform Lower Body Bathing: with max assist;with mod assist;sitting/lateral leans Pt Will Perform Upper Body Dressing: with min assist;sitting Pt Will Transfer to Toilet: with max assist;with +2 assist;bedside commode Additional ADL Goal #1: pt will complete bed mobility with mod A to sit EOB in prep for grooming and ADL tasks  OT Frequency: Min 2X/week   Barriers to D/C: Decreased caregiver support          Co-evaluation              AM-PAC PT "6 Clicks" Daily Activity     Outcome Measure Help from another person eating meals?: None Help from another person taking care of personal grooming?: A Lot Help from another person toileting,  which includes using toliet, bedpan, or urinal?: Total Help from another person bathing (including washing, rinsing, drying)?: Total Help from another person to put on and taking off regular upper body clothing?: A Lot Help from another person to put on and taking off regular lower body clothing?: Total 6 Click Score: 11   End of Session Equipment Utilized During Treatment: Gait belt  Activity Tolerance: Patient limited by pain;Patient limited by fatigue Patient left: with call bell/phone within reach;in chair;with bed alarm set  OT Visit Diagnosis: Unsteadiness on feet (R26.81);Pain;Other abnormalities of gait and mobility (R26.89);Muscle weakness (generalized) (M62.81) Pain - Right/Left: Right Pain - part of body: Hip(back)                Time: 4818-5631 OT Time Calculation (min): 40 min Charges:  OT General Charges $OT Visit: 1 Visit OT Evaluation $OT Eval Moderate Complexity: 1 Mod OT Treatments $Therapeutic Activity: 8-22 mins G-Codes: OT G-codes **NOT FOR INPATIENT CLASS** Functional Assessment Tool Used: AM-PAC 6 Clicks Daily Activity     Gabrianna, [place] 05/18/2018, 2:01 PM

## 2018-05-18 NOTE — Progress Notes (Signed)
[place] TEAM 1 - Stepdown/ICU TEAM  Erika Fry  QQV:[id number] DOB: 10/20/1946 DOA: 05/12/2018 PCP: [name], MD    Brief Narrative:  72yo F w/ a Hx of second-degree AV block S/P permanent pacemaker placement on 04/07/2018, HTN, CKD stage IV, Hypothyroidism, DM2, and Iron Deficiency anemia.  She was discharged to a SNF on 6/4 after being admitted 5/29 following a mechanical fall which resulted in severe back pain and difficulty ambulating. She was started on a steroid Dosepak and discharged to SNF.   In the ED she was found to have a hemoglobin of 4.1 (was 8.3 on 6/4), and a BUN of 158, w/ creatinine 3.62.   Significant Events:  6/14 aspiration of L4/L5 for culture 6/17 tunneled R IJ PICC in IR  Subjective: Resting comfortably in bed at time of visit.  Denies cp, sob, n/v, or abdom pain.    Assessment & Plan:  Acute blood loss anemia + Anemia of chronic kidney disease S/p transfusion of 6U PRBC thus far this admit - Hgb appears stable at this time   Recent Labs  Lab 05/14/18 1151 05/15/18 0652 05/16/18 0517 05/17/18 0205 05/18/18 0434  HGB 7.5* 7.3* 9.8* 10.7* 10.2*    UGIB GI following - to have EGD 6/18  L4/L5 Acute discitis/osteomyelitis - Lumbar nerve root impingement To complete 6 weeks of IV abx tx - lumbar aspirate cultures negative but received 48hrs of abx prior to procedure   CKD stage IV baseline Cr 3.0-4.0  Recent Labs  Lab 05/14/18 0636 05/15/18 0652 05/16/18 0517 05/17/18 0205 05/18/18 0434  CREATININE 3.31* 3.03* 2.80* 2.83* 2.51*    Hyponatremia Improving - follow trend   2:1 heart block - Status post pacemaker placement  Hypothyroidism Cont usual synthroid dose   DM 2 CBG not well controlled - adjust tx and follow  DVT prophylaxis: SCDs w/ GIB  Code Status: FULL CODE Family Communication: no family present at time of exam  Disposition Plan: SDU  Consultants:  GI IR ID  Antimicrobials:  Zosyn 6/12 >  6/16 Rocephin 6/16 > Vanc 6/12 >  Objective: Blood pressure 140/72, pulse (!) 59, temperature 97.8 F (36.6 C), temperature source Oral, resp. rate 13, height 5\' 5"  (1.651 m), weight 96 kg (211 lb 10.3 oz), SpO2 96 %.  Intake/Output Summary (Last 24 hours) at 05/18/2018 1634 Last data filed at 05/18/2018 0600 Gross per 24 hour  Intake 1763.75 ml  Output -  Net 1763.75 ml   Filed Weights   05/14/18 0654 05/15/18 0500 05/17/18 0418  Weight: 90.5 kg (199 lb 8.3 oz) 90.5 kg (199 lb 9.6 oz) 96 kg (211 lb 10.3 oz)    Examination: General: No acute respiratory distress Lungs: Clear to auscultation bilaterally without wheezes or crackles Cardiovascular: Regular rate and rhythm without murmur gallop or rub normal S1 and S2 Abdomen: Nontender, nondistended, soft, bowel sounds positive, no rebound, no ascites, no appreciable mass Extremities: No significant cyanosis, clubbing, or edema bilateral lower extremities  CBC: Recent Labs  Lab 05/12/18 1827  05/14/18 1151  05/16/18 0517 05/17/18 0205 05/18/18 0434  WBC 22.6*   < > 12.0*   < > 8.6 8.0 8.6  NEUTROABS 19.2*  --  11.3*  --   --   --   --   HGB 4.1*   < > 7.5*   < > 9.8* 10.7* 10.2*  HCT 12.6*   < > 22.1*   < > 28.8* 32.2* 30.1*  MCV 90.0   < >  87.4   < > 86.5 88.2 87.8  PLT 305   < > 288   < > 234 237 197   < > = values in this interval not displayed.   Basic Metabolic Panel: Recent Labs  Lab 05/13/18 0427  05/16/18 0517 05/17/18 0205 05/18/18 0434  NA  --    < > 131* 129* 132*  K  --    < > 4.0 4.0 4.3  CL  --    < > 98* 95* 101  CO2  --    < > 22 19* 21*  GLUCOSE  --    < > 238* 291* 223*  BUN  --    < > 113* 106* 86*  CREATININE  --    < > 2.80* 2.83* 2.51*  CALCIUM  --    < > 9.5 9.4 9.5  MG 2.2   < > 2.4 2.1 2.1  PHOS 7.0*  --   --   --   --    < > = values in this interval not displayed.   GFR: Estimated Creatinine Clearance: 22.5 mL/min (A) (by C-G formula based on SCr of 2.51 mg/dL (H)).  Liver  Function Tests: Recent Labs  Lab 05/13/18 0427  AST 22  ALT 11*  ALKPHOS 62  BILITOT 1.1  PROT 5.7*  ALBUMIN 2.4*    Coagulation Profile: Recent Labs  Lab 05/13/18 0427 05/14/18 1151  INR 1.01 1.01    HbA1C: Hgb A1c MFr Bld  Date/Time Value Ref Range Status  04/30/2018 04:55 AM 6.3 (H) 4.8 - 5.6 % Final    Comment:    (NOTE) Pre diabetes:          5.7%-6.4% Diabetes:              >6.4% Glycemic control for   <7.0% adults with diabetes     CBG: Recent Labs  Lab 05/17/18 1957 05/18/18 0016 05/18/18 0426 05/18/18 0752 05/18/18 1216  GLUCAP 265* 278* 131* 172* 166*    Recent Results (from the past 240 hour(s))  Aerobic/Anaerobic Culture (surgical/deep wound)     Status: None (Preliminary result)   Collection Time: 05/15/18  3:09 PM  Result Value Ref Range Status   Specimen Description ABSCESS  Final   Special Requests LUMBAR ASPIRATE  Final   Gram Stain   Final    FEW WBC PRESENT,BOTH PMN AND MONONUCLEAR NO ORGANISMS SEEN    Culture   Final    NO GROWTH 3 DAYS NO ANAEROBES ISOLATED; CULTURE IN PROGRESS FOR 5 DAYS Performed at [facility], [place] [address] [place], [place] [address]    Report Status PENDING  Incomplete  Culture, blood (routine x 2)     Status: None (Preliminary result)   Collection Time: 05/16/18  9:41 PM  Result Value Ref Range Status   Specimen Description BLOOD LEFT ANTECUBITAL  Final   Special Requests   Final    BOTTLES DRAWN AEROBIC AND ANAEROBIC Blood Culture adequate volume   Culture   Final    NO GROWTH 2 DAYS Performed at [facility], [place] [address] [place], [place] [address]    Report Status PENDING  Incomplete  Culture, blood (routine x 2)     Status: None (Preliminary result)   Collection Time: 05/16/18  9:41 PM  Result Value Ref Range Status   Specimen Description BLOOD LEFT ANTECUBITAL  Final   Special Requests   Final    BOTTLES DRAWN AEROBIC AND  ANAEROBIC Blood Culture adequate volume   Culture    Final    NO GROWTH 2 DAYS Performed at [facility], [place] [address], [place], [place] [address]    Report Status PENDING  Incomplete     Scheduled Meds: . acetaminophen  650 mg Oral Q6H  . allopurinol  200 mg Oral Daily  . dexamethasone  4 mg Intravenous Q12H  . fentaNYL      . fentaNYL (SUBLIMAZE) injection  50 mcg Intravenous Once  . gabapentin  300 mg Oral TID  . insulin aspart  0-20 Units Subcutaneous Q4H  . insulin glargine  8 Units Subcutaneous Daily  . levothyroxine  75 mcg Oral QAC breakfast  . lidocaine      . [START ON 05/19/2018] pantoprazole  40 mg Oral Q0600  . polyethylene glycol  17 g Oral BID  . simvastatin  40 mg Oral QHS  . sodium chloride flush  3 mL Intravenous Q12H     LOS: 6 days   Cherene Altes, MD [facility] Office  [phone] Pager - Text Page per Amion as per below:  On-Call/Text Page:      [email or web address]      password [email or web address]  If 7PM-7AM, please contact night-coverage [email or web address] Password [facility] 05/18/2018, 4:34 PM

## 2018-05-18 NOTE — H&P (View-Only) (Signed)
Daily Rounding Note  05/18/2018, 9:22 AM  LOS: 6 days   SUBJECTIVE:   Chief complaint:  Back, hip, leg pain persist.  Not standing yet, using bed pan.  Having regular, darker colored BM's without laxatives , 2 yesterday     OBJECTIVE:         Vital signs in last 24 hours:    Temp:  [97.6 F (36.4 C)-97.8 F (36.6 C)] 97.8 F (36.6 C) (06/17 0754) Pulse Rate:  [59-76] 59 (06/17 0415) Resp:  [13-18] 13 (06/17 0415) BP: (133-154)/(72-94) 140/72 (06/17 0415) SpO2:  [96 %-98 %] 96 % (06/17 0415) Last BM Date: 05/17/18 Filed Weights   05/14/18 0654 05/15/18 0500 05/17/18 0418  Weight: 199 lb 8.3 oz (90.5 kg) 199 lb 9.6 oz (90.5 kg) 211 lb 10.3 oz (96 kg)   General: NAD.  Not ill looking   Heart: RRR Chest: clear bil.  No cough or dyspnea Abdomen: soft, obese, NT.  Active BS  Extremities: no CCE Neuro/Psych:  Oriented x 3.  Moves all 4 limbs without tremor.  strenghth not tested.     Intake/Output from previous day: 06/16 0701 - 06/17 0700 In: 2063.8 [P.O.:480; I.V.:1263.8; IV Piggyback:320] Out: 950 [Urine:950]  Intake/Output this shift: No intake/output data recorded.  Lab Results: Recent Labs    05/16/18 0517 05/17/18 0205 05/18/18 0434  WBC 8.6 8.0 8.6  HGB 9.8* 10.7* 10.2*  HCT 28.8* 32.2* 30.1*  PLT 234 237 197   BMET Recent Labs    05/16/18 0517 05/17/18 0205 05/18/18 0434  NA 131* 129* 132*  K 4.0 4.0 4.3  CL 98* 95* 101  CO2 22 19* 21*  GLUCOSE 238* 291* 223*  BUN 113* 106* 86*  CREATININE 2.80* 2.83* 2.51*  CALCIUM 9.5 9.4 9.5   Scheduled Meds: . acetaminophen  650 mg Oral Q6H  . allopurinol  200 mg Oral Daily  . dexamethasone  4 mg Intravenous Q12H  . gabapentin  300 mg Oral TID  . insulin aspart  0-20 Units Subcutaneous Q4H  . insulin glargine  8 Units Subcutaneous Daily  . levothyroxine  75 mcg Oral QAC breakfast  . pantoprazole  40 mg Intravenous Q12H  . polyethylene glycol   17 g Oral BID  . simvastatin  40 mg Oral QHS  . sodium chloride flush  3 mL Intravenous Q12H   Continuous Infusions: . sodium chloride 75 mL/hr at 05/17/18 1309  . cefTRIAXone (ROCEPHIN)  IV Stopped (05/17/18 1331)  . vancomycin Stopped (05/17/18 2325)   PRN Meds:.methocarbamol, ondansetron **OR** ondansetron (ZOFRAN) IV, oxyCODONE   ASSESMENT:   *   Melenic stool on DRE though patient constipated and last actual bowel movement over 1 week ago.  Not on blood thinners.  Patient denies previous upper endoscopy and colonoscopy.  *   Constipation subjectively and by CT imaging.  Resolved with laxatives.     *   Acute blood loss anemia on top of anemia of chronic kidney disease.  Treated with parenteral iron infusion last year.  On vitamin B12 orally as outpatient. Iron studies do not confirm iron deficiency anemia.  B12 significantly elevated.  *   L4-5  Discitis, disabling pain of back, hip leg.  PICC line placement today to allow for >= 6 weeks of abx.  VAnc and Rocephin in place.     *   Stage 4 CKD.  AKI improved.    *   Hyponatremia.    *  DM2, diet controlled previously, now on insulin as inpt.    *   2:1 heart block.  Status post cardiac pacemaker 03/25/2018.    PLAN   *   EGD tomorrow.  Has never had colonoscopy but prep for this likely to be problematic.     Azucena Freed  05/18/2018, 9:22 AM Phone [phone]     Attending physician's note   I have taken an interval history, reviewed the chart and examined the patient. I agree with the Advanced Practitioner's note, impression and recommendations. EGD tomorrow to further evaluate anemia, heme + stool. Colonoscopy is not possible until she is more mobile and can complete a full bowel prep and can correctly position for colonoscopy - likely as outpatient with Dr. Havery Moros.   Lucio Edward, MD FACG [phone] office

## 2018-05-19 ENCOUNTER — Encounter (HOSPITAL_COMMUNITY): Payer: Self-pay | Admitting: *Deleted

## 2018-05-19 ENCOUNTER — Encounter (HOSPITAL_COMMUNITY)
Admission: EM | Disposition: A | Discharge status: Rehabilitation Facility | Payer: Self-pay | Source: Home / Self Care | Attending: Internal Medicine

## 2018-05-19 ENCOUNTER — Inpatient Hospital Stay (HOSPITAL_COMMUNITY): Payer: Medicare Other | Admitting: Anesthesiology

## 2018-05-19 DIAGNOSIS — K317 Polyp of stomach and duodenum: Secondary | ICD-10-CM

## 2018-05-19 DIAGNOSIS — R195 Other fecal abnormalities: Secondary | ICD-10-CM

## 2018-05-19 HISTORY — PX: BIOPSY: SHX5522

## 2018-05-19 HISTORY — PX: ESOPHAGOGASTRODUODENOSCOPY: SHX5428

## 2018-05-19 LAB — COMPREHENSIVE METABOLIC PANEL
ALBUMIN: 2.3 g/dL — AB (ref 3.5–5.0)
ALK PHOS: 52 U/L (ref 38–126)
ALT: 19 U/L (ref 14–54)
ANION GAP: 9 (ref 5–15)
AST: 25 U/L (ref 15–41)
BUN: 73 mg/dL — ABNORMAL HIGH (ref 6–20)
CALCIUM: 9 mg/dL (ref 8.9–10.3)
CHLORIDE: 102 mmol/L (ref 101–111)
CO2: 20 mmol/L — ABNORMAL LOW (ref 22–32)
Creatinine, Ser: 1.92 mg/dL — ABNORMAL HIGH (ref 0.44–1.00)
GFR calc non Af Amer: 25 mL/min — ABNORMAL LOW (ref >60)
GFR, EST AFRICAN AMERICAN: 29 mL/min — AB (ref >60)
GLUCOSE: 216 mg/dL — AB (ref 65–99)
POTASSIUM: 4.3 mmol/L (ref 3.5–5.1)
SODIUM: 131 mmol/L — AB (ref 135–145)
Total Bilirubin: 0.7 mg/dL (ref 0.3–1.2)
Total Protein: 4.8 g/dL — ABNORMAL LOW (ref 6.5–8.1)

## 2018-05-19 LAB — GLUCOSE, CAPILLARY
GLUCOSE-CAPILLARY: 266 mg/dL — AB (ref 65–99)
GLUCOSE-CAPILLARY: 215 mg/dL — AB (ref 65–99)
GLUCOSE-CAPILLARY: 174 mg/dL — AB (ref 65–99)
GLUCOSE-CAPILLARY: 109 mg/dL — AB (ref 65–99)
Glucose-Capillary: 141 mg/dL — ABNORMAL HIGH (ref 65–99)
Glucose-Capillary: 176 mg/dL — ABNORMAL HIGH (ref 65–99)

## 2018-05-19 LAB — CBC
HCT: 30.2 % — ABNORMAL LOW (ref 36.0–46.0)
HEMOGLOBIN: 10.1 g/dL — AB (ref 12.0–15.0)
MCH: 29.3 pg (ref 26.0–34.0)
MCHC: 33.4 g/dL (ref 30.0–36.0)
MCV: 87.5 fL (ref 78.0–100.0)
PLATELETS: 180 10*3/uL (ref 150–400)
RBC: 3.45 MIL/uL — ABNORMAL LOW (ref 3.87–5.11)
RDW: 15.9 % — AB (ref 11.5–15.5)
WBC: 8.6 10*3/uL (ref 4.0–10.5)

## 2018-05-19 SURGERY — EGD (ESOPHAGOGASTRODUODENOSCOPY)
Anesthesia: Monitor Anesthesia Care

## 2018-05-19 MED ORDER — INSULIN ASPART 100 UNIT/ML ~~LOC~~ SOLN
0.0000 [IU] | Freq: Three times a day (TID) | SUBCUTANEOUS | Status: DC
  Administered 2018-05-20 (×2): 11 [IU] via SUBCUTANEOUS
  Administered 2018-05-20: 15 [IU] via SUBCUTANEOUS
  Administered 2018-05-21: 7 [IU] via SUBCUTANEOUS
  Administered 2018-05-21: 4 [IU] via SUBCUTANEOUS
  Administered 2018-05-21: 7 [IU] via SUBCUTANEOUS
  Administered 2018-05-22: 3 [IU] via SUBCUTANEOUS
  Administered 2018-05-22: 4 [IU] via SUBCUTANEOUS

## 2018-05-19 MED ORDER — PROPOFOL 10 MG/ML IV BOLUS
INTRAVENOUS | Status: DC | PRN
  Administered 2018-05-19: 40 mg via INTRAVENOUS
  Administered 2018-05-19 (×2): 30 mg via INTRAVENOUS
  Administered 2018-05-19: 20 mg via INTRAVENOUS

## 2018-05-19 MED ORDER — DEXAMETHASONE 4 MG PO TABS
2.0000 mg | ORAL_TABLET | Freq: Two times a day (BID) | ORAL | Status: DC
  Administered 2018-05-19 – 2018-05-20 (×3): 2 mg via ORAL
  Filled 2018-05-19 (×3): qty 1

## 2018-05-19 MED ORDER — OXYCODONE HCL 5 MG PO TABS
5.0000 mg | ORAL_TABLET | Freq: Four times a day (QID) | ORAL | Status: DC | PRN
  Administered 2018-05-19 – 2018-05-20 (×2): 5 mg via ORAL
  Administered 2018-05-21 – 2018-05-22 (×3): 10 mg via ORAL
  Filled 2018-05-19: qty 1
  Filled 2018-05-19 (×3): qty 2
  Filled 2018-05-19: qty 1

## 2018-05-19 MED ORDER — FENTANYL CITRATE (PF) 100 MCG/2ML IJ SOLN
12.5000 ug | INTRAMUSCULAR | Status: DC | PRN
  Administered 2018-05-21: 25 ug via INTRAVENOUS
  Filled 2018-05-19: qty 2

## 2018-05-19 MED ORDER — SODIUM CHLORIDE 0.9% FLUSH
10.0000 mL | Freq: Two times a day (BID) | INTRAVENOUS | Status: DC
  Administered 2018-05-19 – 2018-05-21 (×3): 20 mL
  Administered 2018-05-22: 10 mL

## 2018-05-19 MED ORDER — SODIUM CHLORIDE 0.9 % IV SOLN
8.0000 mg/kg | INTRAVENOUS | Status: DC
  Administered 2018-05-19 – 2018-05-21 (×3): 780 mg via INTRAVENOUS
  Filled 2018-05-19 (×4): qty 15.6

## 2018-05-19 MED ORDER — INSULIN ASPART 100 UNIT/ML ~~LOC~~ SOLN
0.0000 [IU] | Freq: Every day | SUBCUTANEOUS | Status: DC
  Administered 2018-05-20: 4 [IU] via SUBCUTANEOUS

## 2018-05-19 NOTE — Progress Notes (Signed)
Physical Therapy Cancellation Note   05/19/18 1307  PT Visit Information  Last PT Received On 05/19/18  Reason Eval/Treat Not Completed Patient at procedure or test/unavailable. PT will continue to follow acutely.     Earney Navy, PTA Pager: [phone]

## 2018-05-19 NOTE — Progress Notes (Signed)
PHARMACY CONSULT NOTE FOR:  OUTPATIENT  PARENTERAL ANTIBIOTIC THERAPY (OPAT)  Indication: Lumbar Discitis  Regimen:  Ceftriaxone 2g once daily Daptomycin 8mg /kg/24h = 780mg  IV weekly CK levels weekly  End date: 06/24/2018  IV antibiotic discharge orders are pended. To discharging provider:  please sign these orders via discharge navigator,  Select New Orders & click on the button choice - Manage This Unsigned Work.     Thank you for allowing pharmacy to be a part of this patient's care.   Erika Fry S. Alford Highland, PharmD, [place] Clinical Staff Pharmacist Pager [phone]

## 2018-05-19 NOTE — Progress Notes (Signed)
Daughter called RN and stated that her mother uses a CPAP at home. RN called MD to have order put in- when RT went into room to place CPAP on, pt stated that she does not wear that and has no idea what it is. RT educate patient on equipment. Patient still responded that she does not wear that and did not want it. Will continue to monitor.

## 2018-05-19 NOTE — Progress Notes (Signed)
RT acknowledged cpap order and found no history of usage or history of OSA in pt's chart.  RT spoke with pt and she states she doe not wear cpap and has never had any problems sleeping and was unsure exactly why she would have an order for one. RT explained usage and she continued to state she does not wear one at home and would not need one here.   On the way out of unit, RN stopped RT to speak about the order stating pt's daughter says pt does wear one at home every night.   Pt is/was alert and aware of all questions asked and RT informed pt if she changed her mind, we could bring the machine.  RT will continue to monitor.

## 2018-05-19 NOTE — Interval H&P Note (Signed)
History and Physical Interval Note:  05/19/2018 12:23 PM  Erika Fry  has presented today for surgery, with the diagnosis of anemia, hemoccult positive stools  The various methods of treatment have been discussed with the patient and family. After consideration of risks, benefits and other options for treatment, the patient has consented to  Procedure(s): ESOPHAGOGASTRODUODENOSCOPY (EGD) (N/A) as a surgical intervention .  The patient's history has been reviewed, patient examined, no change in status, stable for surgery.  I have reviewed the patient's chart and labs.  Questions were answered to the patient's satisfaction.     Pricilla Riffle. Fuller Plan

## 2018-05-19 NOTE — Anesthesia Postprocedure Evaluation (Signed)
Anesthesia Post Note  Patient: Faatima Tench Faulconer  Procedure(s) Performed: ESOPHAGOGASTRODUODENOSCOPY (EGD) (N/A ) BIOPSY     Patient location during evaluation: Endoscopy Anesthesia Type: MAC Level of consciousness: awake and alert, oriented and patient cooperative Pain management: pain level controlled Vital Signs Assessment: post-procedure vital signs reviewed and stable Respiratory status: spontaneous breathing, nonlabored ventilation and respiratory function stable Cardiovascular status: blood pressure returned to baseline and stable Postop Assessment: no apparent nausea or vomiting Anesthetic complications: no    Last Vitals:  Vitals:   05/19/18 1400 05/19/18 1405  BP: (!) 168/72   Pulse: (!) 59   Resp: (!) 9   Temp:    SpO2: 100% 100%    Last Pain:  Vitals:   05/19/18 1340  TempSrc: Oral  PainSc: 0-No pain                 Joliene Salvador,E. Chrisy Hillebrand

## 2018-05-19 NOTE — Progress Notes (Signed)
Physical Therapy Treatment Patient Details Name: Erika Fry MRN: [id number] DOB: [birth year] Today's Date: 05/19/2018    History of Present Illness Pt adm from SNF with fatigue and weakness. Pt found to have acute blood loss anemia and lumbar spine acute osteomyelitis/discitis and rt psoas abcess. Pt with admit to [facility] 5/29-6/4 after fall with back pain. PMH - HTN, Pacer 4/19, ckd, dm, aortic stenosis    PT Comments    Patient required +2 assist for bed mobility and attempts of STS with use of Stedy standing frame. Pt limited by pain and fatigue. Pt will need mechanical lift for transfers OOB. Continue to progress as tolerated.    Follow Up Recommendations  LTACH     Equipment Recommendations  Wheelchair (measurements PT);Rolling walker with 5" wheels    Recommendations for Other Services       Precautions / Restrictions Precautions Precautions: Fall Precaution Comments: Pacemaker placement 4 weeks ago Restrictions Weight Bearing Restrictions: No    Mobility  Bed Mobility Overal bed mobility: Needs Assistance Bed Mobility: Rolling;Sidelying to Sit;Sit to Sidelying Rolling: Mod assist Sidelying to sit: Max assist;+2 for safety/equipment     Sit to sidelying: Max assist;+2 for physical assistance General bed mobility comments: Assist to move legs, elevate trunk into sitting and bring hips to EOB. Assist to lower trunk and bring legs back up into bed.  Transfers Overall transfer level: Needs assistance   Transfers: Sit to/from Stand Sit to Stand: +2 physical assistance;From elevated surface;Max assist         General transfer comment: 2 attemtpts to stand with use of Stedy standing frame, gait belt, and bed pad to aid in hip extension; pt unable to achieve standing up enough for Stedy to be used for transfer; cues for sequencing and technique  Ambulation/Gait                 Stairs             Wheelchair Mobility    Modified Rankin (Stroke  Patients Only)       Balance Overall balance assessment: Needs assistance Sitting-balance support: Feet supported;Bilateral upper extremity supported Sitting balance-Leahy Scale: Poor     Standing balance support: Bilateral upper extremity supported Standing balance-Leahy Scale: Zero                              Cognition Arousal/Alertness: Awake/alert Behavior During Therapy: WFL for tasks assessed/performed Overall Cognitive Status: Within Functional Limits for tasks assessed                                        Exercises      General Comments        Pertinent Vitals/Pain Pain Assessment: Faces Faces Pain Scale: Hurts even more Pain Location: rt hip and  back  Pain Descriptors / Indicators: Sharp;Grimacing;Guarding;Aching;Sore Pain Intervention(s): Limited activity within patient's tolerance;Monitored during session;Repositioned    Home Living                      Prior Function            PT Goals (current goals can now be found in the care plan section) Acute Rehab PT Goals PT Goal Formulation: With patient Time For Goal Achievement: 05/31/18 Potential to Achieve Goals: Fair Progress towards PT goals: Not progressing toward goals -  comment    Frequency    Min 3X/week      PT Plan Current plan remains appropriate    Co-evaluation              AM-PAC PT "6 Clicks" Daily Activity  Outcome Measure  Difficulty turning over in bed (including adjusting bedclothes, sheets and blankets)?: Unable Difficulty moving from lying on back to sitting on the side of the bed? : Unable Difficulty sitting down on and standing up from a chair with arms (e.g., wheelchair, bedside commode, etc,.)?: Unable Help needed moving to and from a bed to chair (including a wheelchair)?: Total Help needed walking in hospital room?: Total Help needed climbing 3-5 steps with a railing? : Total 6 Click Score: 6    End of Session  Equipment Utilized During Treatment: Gait belt Activity Tolerance: Patient limited by pain;Patient limited by fatigue Patient left: in bed;with call bell/phone within reach;with bed alarm set;with family/visitor present Nurse Communication: Mobility status;Need for lift equipment PT Visit Diagnosis: Other abnormalities of gait and mobility (R26.89);History of falling (Z91.81);Muscle weakness (generalized) (M62.81);Pain Pain - Right/Left: Right Pain - part of body: Hip(and low back)     Time: 1530-1610 PT Time Calculation (min) (ACUTE ONLY): 40 min  Charges:  $Therapeutic Activity: 38-52 mins                    G Codes:       Earney Navy, PTA Pager: [phone]     Darliss Cheney 05/19/2018, 4:28 PM

## 2018-05-19 NOTE — Anesthesia Preprocedure Evaluation (Addendum)
Anesthesia Evaluation  Patient identified by MRN, date of birth, ID band Patient awake    Reviewed: Allergy & Precautions, NPO status , Patient's Chart, lab work & pertinent test results  History of Anesthesia Complications Negative for: history of anesthetic complications  Airway Mallampati: II  TM Distance: >3 FB Neck ROM: Full    Dental  (+) Dental Advisory Given   Pulmonary neg pulmonary ROS,    breath sounds clear to auscultation       Cardiovascular hypertension, Pt. on medications + dysrhythmias + pacemaker + Valvular Problems/Murmurs AS  Rhythm:Regular Rate:Normal + Systolic murmurs 5/97 ECHO: EF 70%, Aortic valve Normal leaflets with normal excursion. Mean PG 33 mmHg, Vmax 4 m/sec, however gradient arising throughout LV outflow tract without significant aortic valvular stenosis. AVA by continuity equation at least 1.4 cm2. Mean grad 26 mmHg, max 49 mmHg   Neuro/Psych Does not ambulate: severe back pain, requires steroids    GI/Hepatic GERD  Medicated,Presumed GI bleed   Endo/Other  diabetes (glu 141), Insulin DependentHypothyroidism Morbid obesity  Renal/GU Renal InsufficiencyRenal disease (creat 1.92)     Musculoskeletal  (+) Arthritis ,   Abdominal (+) + obese,   Peds  Hematology INR 1.01   Anesthesia Other Findings   Reproductive/Obstetrics                            Anesthesia Physical Anesthesia Plan  ASA: III  Anesthesia Plan: MAC   Post-op Pain Management:    Induction:   PONV Risk Score and Plan: 2 and Treatment may vary due to age or medical condition  Airway Management Planned: Natural Airway and Nasal Cannula  Additional Equipment:   Intra-op Plan:   Post-operative Plan:   Informed Consent: I have reviewed the patients History and Physical, chart, labs and discussed the procedure including the risks, benefits and alternatives for the proposed anesthesia  with the patient or authorized representative who has indicated his/her understanding and acceptance.   Dental advisory given  Plan Discussed with:   Anesthesia Plan Comments: (Plan routine monitors, MAC)       Anesthesia Quick Evaluation

## 2018-05-19 NOTE — Progress Notes (Addendum)
[place] TEAM 1 - Stepdown/ICU TEAM  JIMMYE WISNIESKI  QIH:[id number] DOB: [birth year] DOA: 05/12/2018 PCP: [name], MD    Brief Narrative:  72yo F w/ a Hx of second-degree AV block S/P permanent pacemaker placement on 04/07/2018, HTN, CKD stage IV, Hypothyroidism, DM2, and Iron Deficiency anemia.  She was discharged to a SNF on 6/4 after being admitted 5/29 following a mechanical fall which resulted in severe back pain and difficulty ambulating.  She was started on a steroid Dosepak and discharged to SNF.   In the ED she was found to have a hemoglobin of 4.1 (was 8.3 on 6/4), and a BUN of 158, w/ creatinine 3.62.   Significant Events: 6/14 aspiration of L4/L5 for culture 6/17 tunneled R IJ PICC in IR 6/18 EGD  Subjective: Reports intermittent but severe low back pain exacerbated by movement.  Denies cp, sob, n/v, or abdom pain.  Tolerated her EGD well.    Assessment & Plan:  Acute blood loss anemia + Anemia of chronic kidney disease S/p transfusion of 6U PRBC thus far this admit - Hgb stable   Recent Labs  Lab 05/15/18 0652 05/16/18 0517 05/17/18 0205 05/18/18 0434 05/19/18 0459  HGB 7.3* 9.8* 10.7* 10.2* 10.1*    UGIB GI following - EGD 6/18 noted one oozing gastric polyp - path report pending - to have outpt colo and f/u EGD  L4/L5 Acute discitis/osteomyelitis - Lumbar nerve root impingement To complete 6 weeks of IV abx tx - lumbar aspirate cultures negative but received 48hrs of abx prior to procedure - ID now suggests change to daptomycin which I have ordered - begin to wean decadron to off   CKD stage IV baseline Cr 3.0-4.0 - renal fxn currently signif better than her baseline   Recent Labs  Lab 05/15/18 0652 05/16/18 0517 05/17/18 0205 05/18/18 0434 05/19/18 0459  CREATININE 3.03* 2.80* 2.83* 2.51* 1.92*    Hyponatremia Follow trend   2:1 heart block - Status post pacemaker placement  Hypothyroidism Cont usual synthroid dose   DM 2 CBG  control improved - adjust dose again and follow   DVT prophylaxis: SCDs  Code Status: FULL CODE Family Communication: no family present at time of exam  Disposition Plan: safe for transfer to med surg   Consultants:  GI IR ID  Antimicrobials:  Zosyn 6/12 > 6/16 Rocephin 6/16 > Vanc 6/12 > 6/18 Daptomycin 6/18 >  Objective: Blood pressure (!) 168/72, pulse (!) 59, temperature 97.7 F (36.5 C), resp. rate (!) 9, height 5\' 5"  (1.651 m), weight 97.5 kg (215 lb), SpO2 100 %.  Intake/Output Summary (Last 24 hours) at 05/19/2018 1725 Last data filed at 05/19/2018 0500 Gross per 24 hour  Intake -  Output 400 ml  Net -400 ml   Filed Weights   05/17/18 0418 05/19/18 0500 05/19/18 1209  Weight: 96 kg (211 lb 10.3 oz) 97.6 kg (215 lb 2.7 oz) 97.5 kg (215 lb)    Examination: General: No acute respiratory distress - alert and pleasant  Lungs: Clear to auscultation B - no wheezing  Cardiovascular: RRR - no M or rub  Abdomen: NT/ND, soft, bs+, no mass, no rebound  Extremities: trace edema B LE   CBC: Recent Labs  Lab 05/12/18 1827  05/14/18 1151  05/17/18 0205 05/18/18 0434 05/19/18 0459  WBC 22.6*   < > 12.0*   < > 8.0 8.6 8.6  NEUTROABS 19.2*  --  11.3*  --   --   --   --  HGB 4.1*   < > 7.5*   < > 10.7* 10.2* 10.1*  HCT 12.6*   < > 22.1*   < > 32.2* 30.1* 30.2*  MCV 90.0   < > 87.4   < > 88.2 87.8 87.5  PLT 305   < > 288   < > 237 197 180   < > = values in this interval not displayed.   Basic Metabolic Panel: Recent Labs  Lab 05/13/18 0427  05/16/18 0517 05/17/18 0205 05/18/18 0434 05/19/18 0459  NA  --    < > 131* 129* 132* 131*  K  --    < > 4.0 4.0 4.3 4.3  CL  --    < > 98* 95* 101 102  CO2  --    < > 22 19* 21* 20*  GLUCOSE  --    < > 238* 291* 223* 216*  BUN  --    < > 113* 106* 86* 73*  CREATININE  --    < > 2.80* 2.83* 2.51* 1.92*  CALCIUM  --    < > 9.5 9.4 9.5 9.0  MG 2.2   < > 2.4 2.1 2.1  --   PHOS 7.0*  --   --   --   --   --    < > = values  in this interval not displayed.   GFR: Estimated Creatinine Clearance: 29.7 mL/min (A) (by C-G formula based on SCr of 1.92 mg/dL (H)).  Liver Function Tests: Recent Labs  Lab 05/13/18 0427 05/19/18 0459  AST 22 25  ALT 11* 19  ALKPHOS 62 52  BILITOT 1.1 0.7  PROT 5.7* 4.8*  ALBUMIN 2.4* 2.3*    Coagulation Profile: Recent Labs  Lab 05/13/18 0427 05/14/18 1151  INR 1.01 1.01    HbA1C: Hgb A1c MFr Bld  Date/Time Value Ref Range Status  04/30/2018 04:55 AM 6.3 (H) 4.8 - 5.6 % Final    Comment:    (NOTE) Pre diabetes:          5.7%-6.4% Diabetes:              >6.4% Glycemic control for   <7.0% adults with diabetes     CBG: Recent Labs  Lab 05/19/18 0134 05/19/18 0459 05/19/18 0750 05/19/18 1140 05/19/18 1639  GLUCAP 266* 215* 174* 141* 109*    Recent Results (from the past 240 hour(s))  Aerobic/Anaerobic Culture (surgical/deep wound)     Status: None (Preliminary result)   Collection Time: 05/15/18  3:09 PM  Result Value Ref Range Status   Specimen Description ABSCESS  Final   Special Requests LUMBAR ASPIRATE  Final   Gram Stain   Final    FEW WBC PRESENT,BOTH PMN AND MONONUCLEAR NO ORGANISMS SEEN    Culture   Final    NO GROWTH 4 DAYS NO ANAEROBES ISOLATED; CULTURE IN PROGRESS FOR 5 DAYS Performed at [facility], [place] [address] [place], [place] [address]    Report Status PENDING  Incomplete  Culture, blood (routine x 2)     Status: None (Preliminary result)   Collection Time: 05/16/18  9:41 PM  Result Value Ref Range Status   Specimen Description BLOOD LEFT ANTECUBITAL  Final   Special Requests   Final    BOTTLES DRAWN AEROBIC AND ANAEROBIC Blood Culture adequate volume   Culture   Final    NO GROWTH 3 DAYS Performed at [facility], [place] [address], [place], [place]  [address]    Report Status PENDING  Incomplete  Culture, blood (routine x 2)     Status: None (Preliminary result)   Collection Time: 05/16/18  9:41 PM  Result  Value Ref Range Status   Specimen Description BLOOD LEFT ANTECUBITAL  Final   Special Requests   Final    BOTTLES DRAWN AEROBIC AND ANAEROBIC Blood Culture adequate volume   Culture   Final    NO GROWTH 3 DAYS Performed at [facility], [address] [place], [place] [address]    Report Status PENDING  Incomplete     Scheduled Meds: . acetaminophen  650 mg Oral Q6H  . allopurinol  200 mg Oral Daily  . dexamethasone  4 mg Intravenous Q12H  . fentaNYL (SUBLIMAZE) injection  50 mcg Intravenous Once  . gabapentin  300 mg Oral TID  . insulin aspart  0-20 Units Subcutaneous Q4H  . insulin glargine  12 Units Subcutaneous Daily  . levothyroxine  75 mcg Oral QAC breakfast  . pantoprazole  40 mg Oral Q0600  . polyethylene glycol  17 g Oral BID  . simvastatin  40 mg Oral QHS  . sodium chloride flush  10-40 mL Intracatheter Q12H  . sodium chloride flush  3 mL Intravenous Q12H     LOS: 7 days   Cherene Altes, MD [facility] Office  [phone] Pager - Text Page per Shea Evans as per below:  On-Call/Text Page:      [email or web address]      password [email or web address]  If 7PM-7AM, please contact night-coverage [email or web address] Password [email or web address] 05/19/2018, 5:25 PM

## 2018-05-19 NOTE — Progress Notes (Signed)
ID RECOMMENDATIONS  For discitis treatment - would recommend switching vancomycin to daptomycin 8mg /kg/renally dosed. Will need weekly CK in addition to ceftriaxone 2gm IV daily.  - will sign off and see back in clinic in 4 wk

## 2018-05-19 NOTE — Transfer of Care (Signed)
Immediate Anesthesia Transfer of Care Note  Patient: Erika Fry  Procedure(s) Performed: ESOPHAGOGASTRODUODENOSCOPY (EGD) (N/A ) BIOPSY  Patient Location: PACU and Endoscopy Unit  Anesthesia Type:MAC  Level of Consciousness: awake, alert , oriented and patient cooperative  Airway & Oxygen Therapy: Patient Spontanous Breathing and Patient connected to nasal cannula oxygen  Post-op Assessment: Report given to RN and Post -op Vital signs reviewed and stable  Post vital signs: Reviewed and stable  Last Vitals:  Vitals Value Taken Time  BP    Temp    Pulse 62 05/19/2018  1:39 PM  Resp 14 05/19/2018  1:39 PM  SpO2 100 % 05/19/2018  1:39 PM  Vitals shown include unvalidated device data.  Last Pain:  Vitals:   05/19/18 1209  TempSrc: Oral  PainSc: 3       Patients Stated Pain Goal: 3 (35/46/56 8127)  Complications: No apparent anesthesia complications

## 2018-05-19 NOTE — Anesthesia Procedure Notes (Signed)
Procedure Name: MAC Date/Time: 05/19/2018 1:27 PM Performed by: Renato Shin, CRNA Pre-anesthesia Checklist: Patient identified, Emergency Drugs available, Suction available and Patient being monitored Patient Re-evaluated:Patient Re-evaluated prior to induction Oxygen Delivery Method: Nasal cannula Preoxygenation: Pre-oxygenation with 100% oxygen Induction Type: IV induction Placement Confirmation: positive ETCO2,  CO2 detector and breath sounds checked- equal and bilateral Dental Injury: Teeth and Oropharynx as per pre-operative assessment

## 2018-05-19 NOTE — Op Note (Signed)
[facility] Patient Name: Erika Fry Procedure Date : 05/19/2018 MRN: [id number] Attending MD: Ladene Artist , MD Date of Birth: Feb 10, 1946 CSN: [id number] Age: 72 Admit Type: Inpatient Procedure:                Upper GI endoscopy Indications:              Heme positive stool, Anemia from suspected chronic                            blood loss Providers:                Pricilla Riffle. Fuller Plan, MD, Cleda Daub, RN, Laurena Spies, Technician Referring MD:             [facility] Medicines:                Monitored Anesthesia Care Complications:            No immediate complications. Estimated Blood Loss:     Estimated blood loss was minimal. Procedure:                Pre-Anesthesia Assessment:                           - Prior to the procedure, a History and Physical                            was performed, and patient medications and                            allergies were reviewed. The patient's tolerance of                            previous anesthesia was also reviewed. The risks                            and benefits of the procedure and the sedation                            options and risks were discussed with the patient.                            All questions were answered, and informed consent                            was obtained. Prior Anticoagulants: The patient has                            taken no previous anticoagulant or antiplatelet                            agents. ASA Grade Assessment: III - A patient with  severe systemic disease. After reviewing the risks                            and benefits, the patient was deemed in                            satisfactory condition to undergo the procedure.                           After obtaining informed consent, the endoscope was                            passed under direct vision. Throughout the   procedure, the patient's blood pressure, pulse, and                            oxygen saturations were monitored continuously. The                            Endoscope was introduced through the mouth, and                            advanced to the second part of duodenum. The upper                            GI endoscopy was accomplished without difficulty.                            The patient tolerated the procedure well. Scope In: Scope Out: Findings:      The examined esophagus was normal.      A medium amount of food (residue) was found in the gastric fundus and in       the gastric body. Portions of the fundus and body were not adequately       visualized.      A single, 10 mm, wide-based, pedunculated polyp with mild oozing,       friable mucosa was found in the pylorus of the stomach. It prolapsed in       and out of the pylorus and duodenal bulb. Biopsies were taken with a       cold forceps for histology. It was not removed to avoid complicating her       hospital course.      The exam of the stomach was otherwise normal.      The duodenal bulb and second portion of the duodenum were normal. Impression:               - Normal esophagus.                           - A medium amount of food (residue) in the proximal                            stomach.                           - A single friable, oozing gastric pylorus  polyp.                            Biopsied.                           - Normal duodenal bulb and second portion of the                            duodenum. Recommendation:           - Return patient to hospital ward for ongoing care.                           - Resume previous diet.                           - Continue present medications.                           - Await pathology results.                           - Return to GI office at appointment to be                            scheduled with Dr. Havery Moros.                           - Elective,  outpatient EGD with a complete exam of                            gastric body and fundus, gastric polypectomy and                            colonoscopy when current medical problems have                            resolved per Dr. Havery Moros. Procedure Code(s):        --- Professional ---                           [phone], Esophagogastroduodenoscopy, flexible,                            transoral; with biopsy, single or multiple Diagnosis Code(s):        --- Professional ---                           K31.7, Polyp of stomach and duodenum                           R19.5, Other fecal abnormalities                           R58, Hemorrhage, not elsewhere classified CPT copyright 2017 American Medical Association. All rights reserved. The codes documented in this report are preliminary and upon  coder review may  be revised to meet current compliance requirements. Ladene Artist, MD 05/19/2018 1:44:51 PM This report has been signed electronically. Number of Addenda: 0

## 2018-05-19 NOTE — Progress Notes (Signed)
ANTIBIOTIC CONSULT NOTE - INITIAL  Pharmacy Consult for Daptomycin Indication: Lumbar discitis/osteo    Allergies  Allergen Reactions  . Codeine Other (See Comments)    Increases Pain and couldn't sleep    Patient Measurements: Height: 5\' 5"  (165.1 cm) Weight: 215 lb (97.5 kg) IBW/kg (Calculated) : 57 Adjusted Body Weight:   Vital Signs: Temp: 97.7 F (36.5 C) (06/18 1700) Temp Source: Oral (06/18 1340) BP: 168/72 (06/18 1400) Pulse Rate: 59 (06/18 1400) Intake/Output from previous day: 06/17 0701 - 06/18 0700 In: 820 [P.O.:820] Out: 1500 [Urine:1500] Intake/Output from this shift: No intake/output data recorded.  Labs: Recent Labs    05/17/18 0205 05/18/18 0434 05/19/18 0459  WBC 8.0 8.6 8.6  HGB 10.7* 10.2* 10.1*  PLT 237 197 180  CREATININE 2.83* 2.51* 1.92*   Estimated Creatinine Clearance: 29.7 mL/min (A) (by C-G formula based on SCr of 1.92 mg/dL (H)). No results for input(s): VANCOTROUGH, VANCOPEAK, VANCORANDOM, GENTTROUGH, GENTPEAK, GENTRANDOM, TOBRATROUGH, TOBRAPEAK, TOBRARND, AMIKACINPEAK, AMIKACINTROU, AMIKACIN in the last 72 hours.   Microbiology:   Medical History: Past Medical History:  Diagnosis Date  . Arthritis    "knees, thumbs" (03/25/2018)  . CKD (chronic kidney disease), stage IV ([place])   . Diet-controlled diabetes mellitus ([place])   . GERD (gastroesophageal reflux disease)   . Gout    "on daily RX" (03/25/2018)  . Heart murmur   . High cholesterol   . Hypertension   . Hypothyroidism   . Iron deficiency anemia    "had to get an iron infusion"  . Migraine    "used to have them growing up" (03/25/2018)  . Presence of permanent cardiac pacemaker 03/25/2018   Assessment: ID: Osteomyelitis / lumbar discitis. Afebrile. WBC 8.6>8.0.  6/14: image-guided lumbar 4-lumbar 5 disc aspiration. PICC 6/17 For 6 weeks of therapy. Scr 1.92 declining right on the borderline for q24 vs q48 hrs dosing.  Zosyn 6/12>>6/16 CTX 6/16 >> (7/24) Vanco  6/12 >> 6/18 Dapto 6/18 >(7/24) - 5/29: CK 81  6/14 Fungus Cx > IP 6/14 Wound/Lumbar Aspirate Cx > ngtd, prelim 6/15 Blood Cx > ngtd  6/12 MRI: Findings concerning for acute osteomyelitis discitis involving the right aspect of the L4-5 interspace as above.   Goal of Therapy:  Eradication of infection  Plan:  -d/c Vancomycin. D/c OPAT discharge order -Start Daptomycin 8mg /kg q 24h (anticipate Scr will con't to improve) -CTX 2g q24h per MD -CK weekly on Dapto - New OPAT orders pended 6/18   Erika Fry, PharmD, BCPS Clinical Staff Pharmacist Pager [phone]  Erika Fry 05/19/2018,6:11 PM

## 2018-05-19 NOTE — Progress Notes (Signed)
Patient states that she does not wear a CPAP at home and does not want to wear one here.

## 2018-05-19 NOTE — Care Management Note (Signed)
Case Management Note  Patient Details  Name: ANNASTYN SILVEY MRN: [id number] Date of Birth: 01-Mar-1946  Subjective/Objective:    Pt admitted with lumbar discitis and falls from home.                 Action/Plan: PT recommending LTACH and OT recommending SNF. CM ran [facility] by my medical director and the patient does not qualify for a LTACH stay. CSW saw pt for SNF and currently the patient is refusing. CM following.  Expected Discharge Date:                  Expected Discharge Plan:     In-House Referral:     Discharge planning Services     Post Acute Care Choice:    Choice offered to:     DME Arranged:    DME Agency:     HH Arranged:    HH Agency:     Status of Service:     If discussed at [employer], dates discussed:    Additional Comments:  Pollie Friar, RN 05/19/2018, 10:37 AM

## 2018-05-19 NOTE — Progress Notes (Signed)
Inpatient Diabetes Program Recommendations  AACE/ADA: New Consensus Statement on Inpatient Glycemic Control (2019)  Target Ranges:  Prepandial:   less than 140 mg/dL      Peak postprandial:   less than 180 mg/dL (1-2 hours)      Critically ill patients:  140 - 180 mg/dL  Results for ZYON, GROUT (MRN [id number]) as of 05/19/2018 11:34  Ref. Range 05/18/2018 07:52 05/18/2018 12:16 05/18/2018 17:11 05/18/2018 20:32 05/19/2018 01:34 05/19/2018 04:59 05/19/2018 07:50  Glucose-Capillary Latest Ref Range: 65 - 99 mg/dL 172 (H) 166 (H) 125 (H) 309 (H) 266 (H) 215 (H) 174 (H)    Review of Glycemic Control  Diabetes history: DM2 (diet controlled) Outpatient Diabetes medications: None Current orders for Inpatient glycemic control: Lantus 12 units daily, Novolog 0-20 units Q4H; Decadron 4 mg Q12H  Inpatient Diabetes Program Recommendations: Insulin - Basal: If Decadron is continued as ordered, please consider increasing Lantus to 20 units daily. Insulin - Meal Coverage: If Decadron is continued and once diet resumed, please consider ordering Novolog 4 units TID with meals for meal coverage if patient east at least 50% of meals.  NOTE: Over the past 24 hours glucose has ranged from 125-309 mg/dl and patient has received a total of Novolog 43 units for correction.  Thanks, Barnie Alderman, RN, MSN, CDE Diabetes Coordinator Inpatient Diabetes Program [phone] (Team Pager from 8am to 5pm)

## 2018-05-20 ENCOUNTER — Encounter (HOSPITAL_COMMUNITY): Payer: Self-pay | Admitting: Gastroenterology

## 2018-05-20 DIAGNOSIS — R5381 Other malaise: Secondary | ICD-10-CM

## 2018-05-20 LAB — CBC
HCT: 29 % — ABNORMAL LOW (ref 36.0–46.0)
HEMOGLOBIN: 9.7 g/dL — AB (ref 12.0–15.0)
MCH: 29.8 pg (ref 26.0–34.0)
MCHC: 33.4 g/dL (ref 30.0–36.0)
MCV: 89.2 fL (ref 78.0–100.0)
PLATELETS: 153 10*3/uL (ref 150–400)
RBC: 3.25 MIL/uL — AB (ref 3.87–5.11)
RDW: 16.2 % — ABNORMAL HIGH (ref 11.5–15.5)
WBC: 10.1 10*3/uL (ref 4.0–10.5)

## 2018-05-20 LAB — BASIC METABOLIC PANEL
ANION GAP: 7 (ref 5–15)
BUN: 68 mg/dL — ABNORMAL HIGH (ref 6–20)
CHLORIDE: 103 mmol/L (ref 101–111)
CO2: 22 mmol/L (ref 22–32)
CREATININE: 1.9 mg/dL — AB (ref 0.44–1.00)
Calcium: 8.6 mg/dL — ABNORMAL LOW (ref 8.9–10.3)
GFR calc non Af Amer: 25 mL/min — ABNORMAL LOW (ref >60)
GFR, EST AFRICAN AMERICAN: 29 mL/min — AB (ref >60)
Glucose, Bld: 292 mg/dL — ABNORMAL HIGH (ref 65–99)
POTASSIUM: 4.4 mmol/L (ref 3.5–5.1)
SODIUM: 132 mmol/L — AB (ref 135–145)

## 2018-05-20 LAB — AEROBIC/ANAEROBIC CULTURE W GRAM STAIN (SURGICAL/DEEP WOUND): Culture: NO GROWTH

## 2018-05-20 LAB — GLUCOSE, CAPILLARY
GLUCOSE-CAPILLARY: 267 mg/dL — AB (ref 65–99)
GLUCOSE-CAPILLARY: 307 mg/dL — AB (ref 65–99)
Glucose-Capillary: 260 mg/dL — ABNORMAL HIGH (ref 65–99)
Glucose-Capillary: 331 mg/dL — ABNORMAL HIGH (ref 65–99)

## 2018-05-20 LAB — AEROBIC/ANAEROBIC CULTURE (SURGICAL/DEEP WOUND)

## 2018-05-20 MED ORDER — GABAPENTIN 300 MG PO CAPS
300.0000 mg | ORAL_CAPSULE | Freq: Two times a day (BID) | ORAL | Status: DC
  Administered 2018-05-20 – 2018-05-22 (×4): 300 mg via ORAL
  Filled 2018-05-20 (×4): qty 1

## 2018-05-20 MED ORDER — SODIUM CHLORIDE 0.9 % IV SOLN
INTRAVENOUS | Status: DC | PRN
Start: 2018-05-20 — End: 2018-05-22
  Administered 2018-05-20 (×2): via INTRAVENOUS

## 2018-05-20 MED ORDER — INSULIN GLARGINE 100 UNIT/ML ~~LOC~~ SOLN
20.0000 [IU] | Freq: Every day | SUBCUTANEOUS | Status: DC
  Administered 2018-05-21 – 2018-05-22 (×2): 20 [IU] via SUBCUTANEOUS
  Filled 2018-05-20 (×3): qty 0.2

## 2018-05-20 MED ORDER — INSULIN GLARGINE 100 UNIT/ML ~~LOC~~ SOLN
10.0000 [IU] | Freq: Once | SUBCUTANEOUS | Status: AC
  Administered 2018-05-20: 10 [IU] via SUBCUTANEOUS
  Filled 2018-05-20: qty 0.1

## 2018-05-20 NOTE — Progress Notes (Signed)
Inpatient Diabetes Program Recommendations  AACE/ADA: New Consensus Statement on Inpatient Glycemic Control (2019)  Target Ranges:  Prepandial:   less than 140 mg/dL      Peak postprandial:   less than 180 mg/dL (1-2 hours)      Critically ill patients:  140 - 180 mg/dL  Results for Erika Fry, Erika Fry (MRN [id number]) as of 05/20/2018 10:24  Ref. Range 05/19/2018 07:50 05/19/2018 11:40 05/19/2018 16:39 05/19/2018 21:51 05/20/2018 07:46  Glucose-Capillary Latest Ref Range: 65 - 99 mg/dL 174 (H) 141 (H) 109 (H) 176 (H) 260 (H)    Review of Glycemic Control  Diabetes history: DM2 (diet controlled) Outpatient Diabetes medications: None Current orders for Inpatient glycemic control: Lantus 12 units daily, Novolog 0-20 units TID with meals; Novolog 0-5 units QHS; Decadron 2 mg Q12H  Inpatient Diabetes Program Recommendations: Insulin - Basal: Noted patient did NOT receive Lantus on 05/19/18. As a result fasting glucose 260 mg/dl this morning. Also noted steroids were decreased. Would not recommend any change with Lantus at this time. Insulin - Meal Coverage: If Decadron is continued, please consider ordering Novolog 2 units TID with meals for meal coverage if patient east at least 50% of meals.  Thanks, Barnie Alderman, RN, MSN, CDE Diabetes Coordinator Inpatient Diabetes Program [phone] (Team Pager from 8am to 5pm)

## 2018-05-20 NOTE — Progress Notes (Signed)
11:18am-Patient's daughter-in-law, Anderson Malta, contacted CSW. Please see full assessment.    10am-CSW spoke with patient again regarding discharge plan. CSW explained that patient does not qualify for LTACH. Patient stated that she is going to CIR. CSW explained that patient had not been recommended by CIR and that there was not a consult in for that. Patient again requested CIR. CSW alerted patient that MD would put in a consult to them, but patient likely would not qualify with insurance. Patient stated she was "absolutely not going to a SNF." CSW alerted RNCM.   Percell Locus Jarett Dralle LCSW [phone]

## 2018-05-20 NOTE — Consult Note (Signed)
Physical Medicine and Rehabilitation Consult   Reason for Consult: Lumbar diskitis.  Referring Physician: Dr. Tawanna Solo   HPI: Erika Fry is a 72 y.o. female with history of HTN, CKD, T2DM, recent admission for fall due to UTI with back pain and was discharged to SNF on 05/05/18. MRI not done due to recent PPM 5/7 and baseline CKD. She was readmitted on 05/12/18 with acute on chronic anemia, reports of constipation and hypotension. She was treated with fluid bolus and PPI. MRI spine done revealing acute osteomyelitis of L4/5 with Paraspinous edema within right psoas muscle and Paraspinous fluid collection as well as moderate to severe right and moderate left foraminal stenosis. Gi consulted for input and recommended supportive care till medical work up complete. She underwent aspiration of fluid collection and cultures negative.  ID consulted for input and recommended at least 6 weeks of IV  Ceftriaxone and renally dosed daptomycin.     She has been transfused with 6 units PRBC during her stay. She had had episodes of heme positive stools and  underwent EGD yesterday revealing single friable gastric pylorus polyp that was biopsied by Dr. Fuller Plan.  To follow up on outpatient basis for colonoscopy and f/u EGD.  Therapy ongoing and patient limited by pain and fatigue. Family requesting CIR for follow up therapy.    Review of Systems  Constitutional: Negative for chills and fever.  HENT: Negative for hearing loss and tinnitus.   Eyes: Negative for blurred vision and double vision.  Respiratory: Negative for cough and shortness of breath.   Cardiovascular: Negative for chest pain and palpitations.  Gastrointestinal: Negative for heartburn and nausea.  Genitourinary: Negative for dysuria and urgency.  Musculoskeletal: Positive for back pain and myalgias.  Skin: Negative for rash.  Neurological: Positive for sensory change and focal weakness.  Psychiatric/Behavioral: The patient is  nervous/anxious.     Past Medical History:  Diagnosis Date  . Arthritis    "knees, thumbs" (03/25/2018)  . CKD (chronic kidney disease), stage IV ([facility])   . Diet-controlled diabetes mellitus ([place])   . GERD (gastroesophageal reflux disease)   . Gout    "on daily RX" (03/25/2018)  . Heart murmur   . High cholesterol   . Hypertension   . Hypothyroidism   . Iron deficiency anemia    "had to get an iron infusion"  . Migraine    "used to have them growing up" (03/25/2018)  . Presence of permanent cardiac pacemaker 03/25/2018    Past Surgical History:  Procedure Laterality Date  . APPENDECTOMY    . INSERT / REPLACE / REMOVE PACEMAKER  03/25/2018  . IR FLUORO GUIDE CV LINE RIGHT  05/18/2018  . IR LUMBAR DISC ASPIRATION W/IMG GUIDE  05/15/2018  . IR US GUIDE VASC ACCESS RIGHT  05/18/2018  . KNEE ARTHROSCOPY Bilateral   . PACEMAKER IMPLANT N/A 03/25/2018   Procedure: PACEMAKER IMPLANT;  Surgeon: Evans Lance, MD;  Location: [place] CV LAB;  Service: Cardiovascular;  Laterality: N/A;  . PACEMAKER PLACEMENT Left 03/2018  . RIGHT HEART CATH N/A 03/20/2018   Procedure: RIGHT HEART CATH;  Surgeon: Nigel Mormon, MD;  Location: [place] [facility];  Service: Cardiovascular;  Laterality: N/A;  . TEE WITHOUT CARDIOVERSION N/A 03/20/2018   Procedure: TRANSESOPHAGEAL ECHOCARDIOGRAM (TEE);  Surgeon: Nigel Mormon, MD;  [facility] END[facility];  Service: Cardiovascular;  Laterality: N/A;  . TONSILLECTOMY      Family History  Problem Relation Age of Onset  .  Hypertension Mother   . Diabetes Mellitus II Father   . Heart disease Father   . Gastric cancer Brother   . Diabetes Mellitus II Brother     Social History:  Lives alone and independent prior to fall last month. Family works. She reports that she has never smoked. She has never used smokeless tobacco. She reports that she drank alcohol. She reports that she does not use drugs.    Allergies  Allergen Reactions  . Codeine  Other (See Comments)    Increases Pain and couldn't sleep    Medications Prior to Admission  Medication Sig Dispense Refill  . acetaminophen (TYLENOL) 325 MG tablet Take 2 tablets (650 mg total) by mouth every 6 (six) hours as needed for mild pain.    [name] Kitchen allopurinol (ZYLOPRIM) 100 MG tablet Take 2 tablets (200 mg total) by mouth daily.    [name] Kitchen amLODipine (NORVASC) 10 MG tablet Take 10 mg by mouth daily.    [name] Kitchen aspirin EC 81 MG tablet Take 81 mg by mouth daily.    . Biotin (BIOTIN FORTE) 3 MG TABS Take 3 mg by mouth daily.    . cetirizine (ZYRTEC) 10 MG tablet Take 10 mg by mouth at bedtime.     . fluticasone (FLONASE) 50 MCG/ACT nasal spray Place 2 sprays into both nostrils daily as needed for allergies or rhinitis.    . folic acid (FOLVITE) 1 MG tablet Take 1 mg by mouth daily.    . furosemide (LASIX) 80 MG tablet Take 80 mg by mouth 2 (two) times daily.     . hydrALAZINE (APRESOLINE) 50 MG tablet Take 50 mg by mouth 3 (three) times daily.    . insulin aspart (NOVOLOG) 100 UNIT/ML injection Inject 0-15 Units into the skin 3 (three) times daily with meals. CBG < 70: implement hypoglycemia protocol CBG 70 - 120: 0 units CBG 121 - 150: 2 units CBG 151 - 200: 3 units CBG 201 - 250: 5 units CBG 251 - 300: 8 units CBG 301 - 350: 11 units CBG 351 - 400: 15 units CBG > 400: call MD.    . insulin aspart (NOVOLOG) 100 UNIT/ML injection Inject 0-5 Units into the skin at bedtime. Correction coverage: HS scale CBG < 70: implement hypoglycemia protocol CBG 70 - 120: 0 units CBG 121 - 150: 0 units CBG 151 - 200: 0 units CBG 201 - 250: 2 units CBG 251 - 300: 3 units CBG 301 - 350: 4 units CBG 351 - 400: 5 units CBG > 400: call MD.    . insulin glargine (LANTUS) 100 UNIT/ML injection Inject 0.05 mLs (5 Units total) into the skin daily. (Patient taking differently: Inject 5 Units into the skin at bedtime. )    . levothyroxine (SYNTHROID, LEVOTHROID) 75 MCG tablet Take 75 mcg by mouth daily before  breakfast.    . [EXPIRED] methocarbamol (ROBAXIN) 500 MG tablet Take 500 mg by mouth every 8 (eight) hours.    . Multiple Vitamin (MULTI-VITAMIN PO) Take 1 tablet by mouth daily.    . polyethylene glycol (MIRALAX / GLYCOLAX) packet Take 17 g by mouth 2 (two) times daily.    . ranitidine (ZANTAC) 150 MG tablet Take 150 mg by mouth daily.     [name] Kitchen senna (SENOKOT) 8.6 MG TABS tablet Take 1 tablet (8.6 mg total) by mouth daily.    . simvastatin (ZOCOR) 40 MG tablet Take 40 mg by mouth at bedtime.     . vitamin B-12 (CYANOCOBALAMIN)  1000 MCG tablet Take 1,000 mcg by mouth daily.    [name] Kitchen oxyCODONE-acetaminophen (PERCOCET) 7.5-325 MG tablet Take 1 tablet by mouth every 6 (six) hours.      Home: Home Living Family/patient expects to be discharged to:: Private residence Living Arrangements: Alone Available Help at Discharge: Family, Available PRN/intermittently Type of Home: House Home Access: Stairs to enter [employer] of Steps: 2 Entrance Stairs-Rails: None Home Layout: Two level, Able to live on main level with bedroom/bathroom Bathroom Shower/Tub: Multimedia programmer: Handicapped height Home Equipment: None  Functional History: Prior Function Level of Independence: Needs assistance Gait / Transfers Assistance Needed: Independent prior to admission at the end of May after fall. Since then pt at most has amb very short (15') distance with walker and 2 person assist. Functional Status:  Mobility: Bed Mobility Overal bed mobility: Needs Assistance Bed Mobility: Rolling, Sidelying to Sit, Sit to Sidelying Rolling: Mod assist Sidelying to sit: Max assist, +2 for safety/equipment Sit to sidelying: Max assist, +2 for physical assistance General bed mobility comments: Assist to move legs, elevate trunk into sitting and bring hips to EOB. Assist to lower trunk and bring legs back up into bed. Transfers Overall transfer level: Needs assistance Transfer via Lift Equipment:  Stedy Transfers: Sit to/from Stand Sit to Stand: +2 physical assistance, From elevated surface, Max assist General transfer comment: 2 attemtpts to stand with use of Stedy standing frame, gait belt, and bed pad to aid in hip extension; pt unable to achieve standing up enough for Stedy to be used for transfer; cues for sequencing and technique      ADL: ADL Overall ADL's : Needs assistance/impaired Grooming: Sitting, Wash/dry hands, Wash/dry face, Minimal assistance Upper Body Bathing: Moderate assistance, Sitting Lower Body Bathing: Total assistance Upper Body Dressing : Sitting, Moderate assistance Lower Body Dressing: Total assistance Toilet Transfer Details (indicate cue type and reason): will need +2 assist Toileting- Clothing Manipulation and Hygiene: Total assistance, Bed level General ADL Comments: pt continues to have limitations due to pain and weakness, Poor endurance  Cognition: Cognition Overall Cognitive Status: Within Functional Limits for tasks assessed Orientation Level: Oriented X4 Cognition Arousal/Alertness: Awake/alert Behavior During Therapy: WFL for tasks assessed/performed Overall Cognitive Status: Within Functional Limits for tasks assessed   Blood pressure (!) 141/73, pulse 72, temperature (!) 97.5 F (36.4 C), temperature source Oral, resp. rate 16, height 5\' 5"  (1.651 m), weight 97.5 kg (215 lb), SpO2 98 %. Physical Exam  Nursing note and vitals reviewed. Constitutional: She is oriented to person, place, and time. She appears well-developed.  Obese female lying in bed with LSO.  NAD  HENT:  Head: Normocephalic.  Eyes: Pupils are equal, round, and reactive to light.  Neck: Normal range of motion.  Cardiovascular: Normal rate.  Respiratory: Effort normal.  Musculoskeletal: She exhibits tenderness (low back TTP and with leg movement).  Neurological: She is alert and oriented to person, place, and time. No cranial nerve deficit.  UE 5/5. LE: 3-/5 prox  to 4/5 distally. No sensory changes  Psychiatric: She has a normal mood and affect. Her behavior is normal. Judgment and thought content normal.    Results for orders placed or performed during the hospital encounter of 05/12/18 (from the past 24 hour(s))  Glucose, capillary     Status: Abnormal   Collection Time: 05/19/18  4:39 PM  Result Value Ref Range   Glucose-Capillary 109 (H) 65 - 99 mg/dL  Glucose, capillary     Status: Abnormal  Collection Time: 05/19/18  9:51 PM  Result Value Ref Range   Glucose-Capillary 176 (H) 65 - 99 mg/dL  Basic metabolic panel     Status: Abnormal   Collection Time: 05/20/18  4:39 AM  Result Value Ref Range   Sodium 132 (L) 135 - 145 mmol/L   Potassium 4.4 3.5 - 5.1 mmol/L   Chloride 103 101 - 111 mmol/L   CO2 22 22 - 32 mmol/L   Glucose, Bld 292 (H) 65 - 99 mg/dL   BUN 68 (H) 6 - 20 mg/dL   Creatinine, Ser 1.90 (H) 0.44 - 1.00 mg/dL   Calcium 8.6 (L) 8.9 - 10.3 mg/dL   GFR calc non Af Amer 25 (L) >60 mL/min   GFR calc Af Amer 29 (L) >60 mL/min   Anion gap 7 5 - 15  CBC     Status: Abnormal   Collection Time: 05/20/18  4:39 AM  Result Value Ref Range   WBC 10.1 4.0 - 10.5 K/uL   RBC 3.25 (L) 3.87 - 5.11 MIL/uL   Hemoglobin 9.7 (L) 12.0 - 15.0 g/dL   HCT 29.0 (L) 36.0 - 46.0 %   MCV 89.2 78.0 - 100.0 fL   MCH 29.8 26.0 - 34.0 pg   MCHC 33.4 30.0 - 36.0 g/dL   RDW 16.2 (H) 11.5 - 15.5 %   Platelets 153 150 - 400 K/uL  Glucose, capillary     Status: Abnormal   Collection Time: 05/20/18  7:46 AM  Result Value Ref Range   Glucose-Capillary 260 (H) 65 - 99 mg/dL  Glucose, capillary     Status: Abnormal   Collection Time: 05/20/18 12:15 PM  Result Value Ref Range   Glucose-Capillary 267 (H) 65 - 99 mg/dL   No results found.  Assessment/Plan: Diagnosis: functional deficits secondary to L4-5 osteomyelitis and debility 1. Does the need for close, 24 hr/day medical supervision in concert with the patient's rehab needs make it unreasonable for  this patient to be served in a less intensive setting? Yes 2. Co-Morbidities requiring supervision/potential complications: morbid obesity, CKD, DM, HTN, upper GI bleed, IV antibiotics, pain mgt 3. Due to bladder management, bowel management, safety, skin/wound care, disease management, medication administration, pain management and patient education, does the patient require 24 hr/day rehab nursing? Yes 4. Does the patient require coordinated care of a physician, rehab nurse, PT (1-2 hrs/day, 5 days/week) and OT (1-2 hrs/day, 5 days/week) to address physical and functional deficits in the context of the above medical diagnosis(es)? Yes Addressing deficits in the following areas: balance, endurance, locomotion, strength, transferring, bowel/bladder control, bathing, dressing, feeding, grooming, toileting and psychosocial support 5. Can the patient actively participate in an intensive therapy program of at least 3 hrs of therapy per day at least 5 days per week? Yes 6. The potential for patient to make measurable gains while on inpatient rehab is excellent 7. Anticipated functional outcomes upon discharge from inpatient rehab are supervision and min assist  with PT, supervision and min assist with OT, n/a with SLP. 8. Estimated rehab length of stay to reach the above functional goals is: 15-20 days 9. Anticipated D/C setting: Home 10. Anticipated post D/C treatments: HH therapy and Outpatient therapy 11. Overall Rehab/Functional Prognosis: excellent  RECOMMENDATIONS: This patient's condition is appropriate for continued rehabilitative care in the following setting: CIR Patient has agreed to participate in recommended program. Yes Note that insurance prior authorization may be required for reimbursement for recommended care.  Comment: Rehab Admissions Coordinator  to follow up.  Thanks,  Meredith Staggers, MD, Mellody Drown  I have personally performed a face to face diagnostic evaluation of this  patient. Additionally, I have reviewed and concur with the physician assistant's documentation above.      Bary Leriche, PA-C 05/20/2018

## 2018-05-20 NOTE — NC FL2 (Signed)
[place] MEDICAID FL2 LEVEL OF CARE SCREENING TOOL     IDENTIFICATION  Patient Name: Erika Fry Birthdate: [birth year] Sex: female Admission Date (Current Location): 05/12/2018  [facility] and [place] Number:  Herbalist and Address:  [facility], [place] [address], [place], [place] [address]      Provider Number: [id number]  Attending Physician Name and Address:  Shelly Coss, MD  Relative Name and Phone Number:  Anderson Malta Daughter-in-law, [phone]    Current Level of Care: Hospital Recommended Level of Care: [place] Prior Approval Number:    Date Approved/Denied:   PASRR Number: [id number]  Discharge Plan: SNF    Current Diagnoses: Patient Active Problem List   Diagnosis Date Noted  . Occult blood in stools   . Gastric polyp   . Lumbar discitis 05/17/2018  . Blood loss anemia 05/12/2018  . Hyperlipidemia associated with type 2 diabetes mellitus ([place]) 05/10/2018  . Essential hypertension 04/29/2018  . CKD (chronic kidney disease), stage IV ([place])   . Diet-controlled diabetes mellitus ([place])   . GERD (gastroesophageal reflux disease)   . Gout   . High cholesterol   . Hypothyroidism   . Iron deficiency anemia   . AV block, Mobitz 2 03/25/2018  . Mobitz type 2 second degree AV block 03/20/2018  . Aortic stenosis 03/20/2018  . Exertional dyspnea 03/19/2018    Orientation RESPIRATION BLADDER Height & Weight     Self, Time, Situation, Place  Normal Continent, External catheter Weight: 97.5 kg (215 lb) Height:  5\' 5"  (165.1 cm)  BEHAVIORAL SYMPTOMS/MOOD NEUROLOGICAL BOWEL NUTRITION STATUS      Continent Diet(Please see DC Summary)  AMBULATORY STATUS COMMUNICATION OF NEEDS Skin   Extensive Assist Verbally Normal                       Personal Care Assistance Level of Assistance  Bathing, Feeding, Dressing Bathing Assistance: Maximum assistance Feeding assistance: Limited assistance Dressing  Assistance: Limited assistance     Functional Limitations Info  Sight, Hearing, Speech Sight Info: Adequate Hearing Info: Adequate Speech Info: Adequate    SPECIAL CARE FACTORS FREQUENCY  PT (By licensed PT), OT (By licensed OT)     PT Frequency: 5x/week OT Frequency: 3x/week            Contractures Contractures Info: Not present    Additional Factors Info  Code Status, Allergies, Insulin Sliding Scale Code Status Info: Full Allergies Info: Codeine   Insulin Sliding Scale Info: Every 4 hours       Current Medications (05/20/2018):  This is the current hospital active medication list Current Facility-Administered Medications  Medication Dose Route Frequency Provider Last Rate Last Dose  . acetaminophen (TYLENOL) tablet 650 mg  650 mg Oral Q6H Bennie Pierini, MD   650 mg at 05/20/18 1059  . allopurinol (ZYLOPRIM) tablet 200 mg  200 mg Oral Daily Bennie Pierini, MD   200 mg at 05/20/18 1059  . cefTRIAXone (ROCEPHIN) 2 g in sodium chloride 0.9 % 100 mL IVPB  2 g Intravenous Q24H Michel Bickers, MD   Stopped at 05/18/18 1430  . DAPTOmycin (CUBICIN) 780 mg in sodium chloride 0.9 % IVPB  8 mg/kg Intravenous Q24H Karren Cobble, [place]   Stopped at 05/19/18 2125  . dexamethasone (DECADRON) tablet 2 mg  2 mg Oral Q12H Cherene Altes, MD   2 mg at 05/20/18 1059  . fentaNYL (SUBLIMAZE) injection 12.5-25 mcg  12.5-25 mcg Intravenous Q2H PRN Cherene Altes, MD      . gabapentin (NEURONTIN) capsule 300 mg  300 mg Oral BID Adhikari, Amrit, MD      . insulin aspart (novoLOG) injection 0-20 Units  0-20 Units Subcutaneous TID WC Cherene Altes, MD   11 Units at 05/20/18 [phone]  . insulin aspart (novoLOG) injection 0-5 Units  0-5 Units Subcutaneous QHS Cherene Altes, MD      . insulin glargine (LANTUS) injection 12 Units  12 Units Subcutaneous Daily Cherene Altes, MD   12 Units at 05/20/18 1059  . levothyroxine (SYNTHROID, LEVOTHROID) tablet 75 mcg  75 mcg Oral  QAC breakfast Bennie Pierini, MD   75 mcg at 05/20/18 [phone]  . lidocaine (PF) (XYLOCAINE) 1 % injection    PRN Hoss, Arthur, MD   5 mL at 05/18/18 1548  . methocarbamol (ROBAXIN) tablet 500 mg  500 mg Oral Q8H PRN Hammonds, Sharyn Blitz, MD   500 mg at 05/18/18 0052  . ondansetron (ZOFRAN) tablet 4 mg  4 mg Oral Q6H PRN Bennie Pierini, MD       Or  . ondansetron [facility]) injection 4 mg  4 mg Intravenous Q6H PRN Bennie Pierini, MD      . oxyCODONE (Oxy IR/ROXICODONE) immediate release tablet 5-10 mg  5-10 mg Oral Q6H PRN Cherene Altes, MD   5 mg at 05/19/18 1901  . pantoprazole (PROTONIX) EC tablet 40 mg  40 mg Oral Q0600 Vena Rua, PA-C   40 mg at 05/20/18 0525  . polyethylene glycol (MIRALAX / GLYCOLAX) packet 17 g  17 g Oral BID Yetta Flock, MD   17 g at 05/15/18 2258  . simvastatin (ZOCOR) tablet 40 mg  40 mg Oral QHS Bennie Pierini, MD   40 mg at 05/19/18 2228  . sodium chloride flush (NS) 0.9 % injection 10-40 mL  10-40 mL Intracatheter Q12H Cherene Altes, MD   20 mL at 05/20/18 0449  . sodium chloride flush (NS) 0.9 % injection 3 mL  3 mL Intravenous Q12H Bennie Pierini, MD   3 mL at 05/19/18 1628     Discharge Medications: Please see discharge summary for a list of discharge medications.  Relevant Imaging Results:  Relevant Lab Results:   Additional Information SSn: [id number]   Needs 6 weeks of IV Rocephin and [place] Trinika Cortese, LCSWA

## 2018-05-20 NOTE — Progress Notes (Signed)
PROGRESS NOTE    Erika Fry  KXF:[id number] DOB: 1946/11/21 DOA: 05/12/2018 PCP: [name], MD   Brief Narrative: Patient is a 72yo F w/ a Hx of second-degree AV block S/P permanent pacemakerplacement on 04/07/2018, HTN, CKD stage IV, Hypothyroidism, DM2,  Iron Deficiency anemia.  She was discharged to a SNF on 6/4after being admitted 5/29 following a mechanical fall which resulted in severe back pain and difficulty ambulating.  She was started on a steroid Dosepak and discharged to SNF.  MRI was not obtained due to recent placement of permanent pacemaker and CT myelogram was also contaminated due to patient's baseline CKD.  She continued to have back pain and also began to experience fatigue and weakness. In the ED she was found to have a hemoglobin of 4.1 (was 8.3 on 6/4), and a BUN of 158, w/ creatinine 3.62.  She was started on Protonix, given 2 units of PRBCs.  GI consulted.  At the meantime, MRI lumbar spine showed lumbar discitis.  ID was consulted and started on antibiotics which will be continued for total of 6 weeks.    Assessment & Plan:   Principal Problem:   Lumbar discitis Active Problems:   Mobitz type 2 second degree AV block   CKD (chronic kidney disease), stage IV (HCC)   Diet-controlled diabetes mellitus (HCC)   GERD (gastroesophageal reflux disease)   Gout   High cholesterol   Hypothyroidism   Essential hypertension   Blood loss anemia   Occult blood in stools   Gastric polyp   Acute blood loss anemia /Anemia of chronic kidney disease: S/p transfusion of 6U PRBC thus far this admit .  Currently hemoglobin is stable .  We will continue to monitor.  UGIB: GI following - EGD 6/18 noted one oozing gastric polyp - path report pending - to have outpt colo and f/u EGD as an outpatient.  L4/L5 Acute discitis/osteomyelitis: Lumbar nerve root impingement: To complete 6 weeks of IV abx tx - lumbar aspirate cultures negative but received 48hrs of abx  prior to procedure - ID recommending total 6 weeks of daptomycin and ceftriaxone which will be continued until July 24.  Patient already underwent  tunneled PICC line.We will plan to wean decadron to off.  She will follow-up with infectious disease as an outpatient  CKD stage IV: Baseline Cr 3.0-4.0 - renal fxn currently signifcantly  better than her baseline   Hyponatremia: Follow trend   2:1 heart block: Status post pacemaker placement  Hypothyroidism: Cont usual synthroid dose   DM 2: Uncontrolled blood glucose most likely secondary to steroids.  We will continue Lantus and sliding scale insulin..   Disposition: Skilled nursing facility versus inpatient rehab.  We have requested for inpatient rehab consultation.SW following.  DVT prophylaxis: SCD Code Status: Full Family Communication: None present at the bedside Disposition Plan: To skilled nursing facility as soon as the bed is available inpatient rehab has also been consulted.   Consultants:   Procedures: 6/14 aspiration of L4/L5 for culture 6/17 tunneled R IJ PICC in IR 6/18 EGD   Antimicrobials: Daptomycin, ceftriaxone  Subjective: Patient seen and examined the bedside this morning.  Remains comfortable.  Looks better today.  No active issues/events .denies any back pain  Objective: Vitals:   05/19/18 2037 05/20/18 0010 05/20/18 0410 05/20/18 0810  BP: (!) 156/80 120/67 123/65 (!) 145/74  Pulse: 63 70 66 64  Resp: 14 15 15 13   Temp: 98.1 F (36.7 C) 98.6 F (37 C) 98.8  F (37.1 C)   TempSrc: Oral Oral Oral   SpO2: 99% 95% 93% 97%  Weight:      Height:        Intake/Output Summary (Last 24 hours) at 05/20/2018 1303 Last data filed at 05/20/2018 1231 Gross per 24 hour  Intake 677.21 ml  Output 2550 ml  Net -1872.79 ml   Filed Weights   05/17/18 0418 05/19/18 0500 05/19/18 1209  Weight: 96 kg (211 lb 10.3 oz) 97.6 kg (215 lb 2.7 oz) 97.5 kg (215 lb)    Examination:  General exam: Appears  calm and comfortable ,Not in distress,obese HEENT:PERRL,Oral mucosa moist, Ear/Nose normal on gross exam Respiratory system: Bilateral equal air entry, normal vesicular breath sounds, no wheezes or crackles  Cardiovascular system: S1 & S2 heard, RRR. No JVD, murmurs, rubs, gallops or clicks. No pedal edema. Gastrointestinal system: Abdomen is nondistended, soft and nontender. No organomegaly or masses felt. Normal bowel sounds heard. Central nervous system: Alert and oriented. No focal neurological deficits. Extremities: No edema, no clubbing ,no cyanosis, distal peripheral pulses palpable. Skin: No rashes, lesions or ulcers,no icterus ,no pallor MSK: Normal muscle bulk,tone ,power.Lumbar belt Psychiatry: Judgement and insight appear normal. Mood & affect appropriate.     Data Reviewed: I have personally reviewed following labs and imaging studies  CBC: Recent Labs  Lab 05/14/18 1151  05/16/18 0517 05/17/18 0205 05/18/18 0434 05/19/18 0459 05/20/18 0439  WBC 12.0*   < > 8.6 8.0 8.6 8.6 10.1  NEUTROABS 11.3*  --   --   --   --   --   --   HGB 7.5*   < > 9.8* 10.7* 10.2* 10.1* 9.7*  HCT 22.1*   < > 28.8* 32.2* 30.1* 30.2* 29.0*  MCV 87.4   < > 86.5 88.2 87.8 87.5 89.2  PLT 288   < > 234 237 197 180 153   < > = values in this interval not displayed.   Basic Metabolic Panel: Recent Labs  Lab 05/14/18 0636 05/15/18 7628 05/16/18 0517 05/17/18 0205 05/18/18 0434 05/19/18 0459 05/20/18 0439  NA 136 131* 131* 129* 132* 131* 132*  K 4.4 3.6 4.0 4.0 4.3 4.3 4.4  CL 97* 99* 98* 95* 101 102 103  CO2 20* 21* 22 19* 21* 20* 22  GLUCOSE 214* 230* 238* 291* 223* 216* 292*  BUN 131* 122* 113* 106* 86* 73* 68*  CREATININE 3.31* 3.03* 2.80* 2.83* 2.51* 1.92* 1.90*  CALCIUM 9.6 9.4 9.5 9.4 9.5 9.0 8.6*  MG 2.4 2.4 2.4 2.1 2.1  --   --    GFR: Estimated Creatinine Clearance: 30 mL/min (A) (by C-G formula based on SCr of 1.9 mg/dL (H)). Liver Function Tests: Recent Labs  Lab  05/19/18 0459  AST 25  ALT 19  ALKPHOS 52  BILITOT 0.7  PROT 4.8*  ALBUMIN 2.3*   No results for input(s): LIPASE, AMYLASE in the last 168 hours. No results for input(s): AMMONIA in the last 168 hours. Coagulation Profile: Recent Labs  Lab 05/14/18 1151  INR 1.01   Cardiac Enzymes: No results for input(s): CKTOTAL, CKMB, CKMBINDEX, TROPONINI in the last 168 hours. BNP (last 3 results) No results for input(s): PROBNP in the last 8760 hours. HbA1C: No results for input(s): HGBA1C in the last 72 hours. CBG: Recent Labs  Lab 05/19/18 1140 05/19/18 1639 05/19/18 2151 05/20/18 0746 05/20/18 1215  GLUCAP 141* 109* 176* 260* 267*   Lipid Profile: No results for input(s): CHOL, HDL, LDLCALC, TRIG,  CHOLHDL, LDLDIRECT in the last 72 hours. Thyroid Function Tests: No results for input(s): TSH, T4TOTAL, FREET4, T3FREE, THYROIDAB in the last 72 hours. Anemia Panel: No results for input(s): VITAMINB12, FOLATE, FERRITIN, TIBC, IRON, RETICCTPCT in the last 72 hours. Sepsis Labs: No results for input(s): PROCALCITON, LATICACIDVEN in the last 168 hours.  Recent Results (from the past 240 hour(s))  Fungus Culture With Stain     Status: None (Preliminary result)   Collection Time: 05/15/18  3:09 PM  Result Value Ref Range Status   Fungus Stain Final report  Final    Comment: (NOTE) Performed At: [facility] [place], [place] [id number] Rush Farmer MD IO:[id number]    Fungus (Mycology) Culture PENDING  Incomplete   Fungal Source ABSCESS  Final    Comment: LUMBAR ASPIRATE Performed at [facility], [place] [address] [place], [place] [address]   Aerobic/Anaerobic Culture (surgical/deep wound)     Status: None   Collection Time: 05/15/18  3:09 PM  Result Value Ref Range Status   Specimen Description ABSCESS  Final   Special Requests LUMBAR ASPIRATE  Final   Gram Stain   Final    FEW WBC PRESENT,BOTH PMN AND MONONUCLEAR NO ORGANISMS SEEN     Culture   Final    No growth aerobically or anaerobically. Performed at [facility], [place] [address], [place], [place] [address]    Report Status 05/20/2018 FINAL  Final  Fungus Culture Result     Status: None   Collection Time: 05/15/18  3:09 PM  Result Value Ref Range Status   Result 1 Comment  Final    Comment: (NOTE) KOH/Calcofluor preparation:  no fungus observed. Performed At: [facility] [place], [place] [id number] Rush Farmer MD QQ:[id number] Performed at [facility], [place] [address], [place], [place] [address]   Culture, blood (routine x 2)     Status: None (Preliminary result)   Collection Time: 05/16/18  9:41 PM  Result Value Ref Range Status   Specimen Description BLOOD LEFT ANTECUBITAL  Final   Special Requests   Final    BOTTLES DRAWN AEROBIC AND ANAEROBIC Blood Culture adequate volume   Culture   Final    NO GROWTH 3 DAYS Performed at [facility], [place] [address], [place], [place] [address]    Report Status PENDING  Incomplete  Culture, blood (routine x 2)     Status: None (Preliminary result)   Collection Time: 05/16/18  9:41 PM  Result Value Ref Range Status   Specimen Description BLOOD LEFT ANTECUBITAL  Final   Special Requests   Final    BOTTLES DRAWN AEROBIC AND ANAEROBIC Blood Culture adequate volume   Culture   Final    NO GROWTH 3 DAYS Performed at [facility], [place] [address] [place], [place] [address]    Report Status PENDING  Incomplete         Radiology Studies: Ir Fluoro Guide Cv Line Right  Result Date: 05/19/2018 INDICATION: L4-L5 discitis. EXAM: FLUORO GUIDED DISC ASPIRATION AT L4-5 MEDICATIONS: The patient is currently admitted to the hospital and receiving intravenous antibiotics. The antibiotics were administered within an appropriate time frame prior to the initiation of the procedure. ANESTHESIA/SEDATION: Fentanyl 1 mcg IV; Versed 50 mg IV. Moderate Sedation Time: 22 minutes. The  patient was continuously monitored during the procedure by the interventional radiology nurse under my direct supervision. COMPLICATIONS: None immediate. PROCEDURE: Informed written consent was  obtained from the patient after a thorough discussion of the procedural risks, benefits and alternatives. All questions were addressed. Maximal Sterile Barrier Technique was utilized including caps, mask, sterile gowns, sterile gloves, sterile drape, hand hygiene and skin antiseptic. A timeout was performed prior to the initiation of the procedure. The patient was laid prone on the fluoroscopic table. The skin overlying the lumbar region was then prepped and draped in the usual sterile fashion. The skin entry site overlying the left posterolateral paraspinal area at L4-5 was then infiltrated with 0.25% bupivacaine. Using biplane intermittent fluoroscopy, a 21 gauge Franseen needle was then advanced into the L4-L5 disc space without difficulty. Crossing of the midline was noted on the AP projection. Using a 20 mL syringe, approximately 4 mL of thick diskus bloody aspirate was obtained and sent for microbiologic analysis. Hemostasis was achieved at the skin entry site. The patient tolerated the procedure well. There were no acute complications. Patient was then transported to her room in stable condition. IMPRESSION: Status post fluoroscopic guided needle aspiration at L4-5 for lumbar discitis. Electronically Signed   By: Luanne Bras M.D.   On: 05/18/2018 16:08   Ir US Guide Vasc Access Right  Result Date: 05/19/2018 INDICATION: L4-L5 discitis. EXAM: FLUORO GUIDED DISC ASPIRATION AT L4-5 MEDICATIONS: The patient is currently admitted to the hospital and receiving intravenous antibiotics. The antibiotics were administered within an appropriate time frame prior to the initiation of the procedure. ANESTHESIA/SEDATION: Fentanyl 1 mcg IV; Versed 50 mg IV. Moderate Sedation Time: 22 minutes. The patient was continuously  monitored during the procedure by the interventional radiology nurse under my direct supervision. COMPLICATIONS: None immediate. PROCEDURE: Informed written consent was obtained from the patient after a thorough discussion of the procedural risks, benefits and alternatives. All questions were addressed. Maximal Sterile Barrier Technique was utilized including caps, mask, sterile gowns, sterile gloves, sterile drape, hand hygiene and skin antiseptic. A timeout was performed prior to the initiation of the procedure. The patient was laid prone on the fluoroscopic table. The skin overlying the lumbar region was then prepped and draped in the usual sterile fashion. The skin entry site overlying the left posterolateral paraspinal area at L4-5 was then infiltrated with 0.25% bupivacaine. Using biplane intermittent fluoroscopy, a 21 gauge Franseen needle was then advanced into the L4-L5 disc space without difficulty. Crossing of the midline was noted on the AP projection. Using a 20 mL syringe, approximately 4 mL of thick diskus bloody aspirate was obtained and sent for microbiologic analysis. Hemostasis was achieved at the skin entry site. The patient tolerated the procedure well. There were no acute complications. Patient was then transported to her room in stable condition. IMPRESSION: Status post fluoroscopic guided needle aspiration at L4-5 for lumbar discitis. Electronically Signed   By: Luanne Bras M.D.   On: 05/18/2018 16:08        Scheduled Meds: . acetaminophen  650 mg Oral Q6H  . allopurinol  200 mg Oral Daily  . dexamethasone  2 mg Oral Q12H  . gabapentin  300 mg Oral BID  . insulin aspart  0-20 Units Subcutaneous TID WC  . insulin aspart  0-5 Units Subcutaneous QHS  . insulin glargine  10 Units Subcutaneous Once  . [START ON 05/21/2018] insulin glargine  20 Units Subcutaneous Daily  . levothyroxine  75 mcg Oral QAC breakfast  . pantoprazole  40 mg Oral Q0600  . polyethylene glycol  17 g  Oral BID  . simvastatin  40 mg Oral QHS  .  sodium chloride flush  10-40 mL Intracatheter Q12H  . sodium chloride flush  3 mL Intravenous Q12H   Continuous Infusions: . sodium chloride 10 mL/hr at 05/20/18 1234  . cefTRIAXone (ROCEPHIN)  IV 2 g (05/20/18 1237)  . DAPTOmycin (CUBICIN)  IV Stopped (05/19/18 2125)     LOS: 8 days    Time spent: 35 mins.More than 50% of that time was spent in counseling and/or coordination of care.      Shelly Coss, MD [facility] Pager [phone]  If 7PM-7AM, please contact night-coverage [email or web address] Password [email or web address] 05/20/2018, 1:03 PM

## 2018-05-20 NOTE — Clinical Social Work Note (Signed)
Clinical Social Work Assessment  Patient Details  Name: Erika Fry MRN: [id number] Date of Birth: 03/01/1946  Date of referral:  05/20/18               Reason for consult:  Facility Placement                Permission sought to share information with:  Facility Sport and exercise psychologist, Family Supports Permission granted to share information::  Yes, Verbal Permission Granted  Name::     Pension scheme manager::  SNFs  Relationship::  Daughter in Financial trader Information:  [phone]  Housing/Transportation Living arrangements for the past 2 months:  [place], [place] of Information:  Patient, Other (Comment Required)(DIL) Patient Interpreter Needed:  None Criminal Activity/Legal Involvement Pertinent to Current Situation/Hospitalization:  No - Comment as needed Significant Relationships:  Adult Children Lives with:  Self Do you feel safe going back to the place where you live?  No Need for family participation in patient care:  Yes (Comment)  Care giving concerns:  CSW received consult for possible SNF placement at time of discharge. CSW spoke with patient regarding PT recommendation of SNF placement at time of discharge. Patient reported that she prefers CIR and does not want to go back to SNF, since [employer] was "terrible". CSW reiterated that if CIR says no, patient would be discharging home alone with a PICC line for iv antibiotics. Patient requested CSW speak with her daughter in law, Higher education careers adviser. Erika Fry stated that they are hopeful to go to CIR. CSW explained that patient likely would not qualify but would have MD put in a consult. CSW to continue to follow and assist with discharge planning needs.   Social Worker assessment / plan:  CSW spoke with patient concerning possibility of rehab at [facility] before returning home.  Employment status:  Retired Nurse, adult PT Recommendations:  [place] / Referral to community resources:  [place]  Patient/Family's Response to care:  Patient recognizes need for rehab before returning home but prefers CIR. She stated that MD told her that we were just waiting on a bed. CSW explained CIR consult process. CSW emailed SNF bed offers to [employer]. She asked about [facility], which CSW will look into.   Patient/Family's Understanding of and Emotional Response to Diagnosis, Current Treatment, and Prognosis:  Patient recognizes need for rehab before returning home and Erika Fry is agreeable to a SNF in [facility] if CIR cannot accept patient.  Emotional Assessment Appearance:  Appears stated age Attitude/Demeanor/Rapport:  Engaged Affect (typically observed):  Accepting, Appropriate, Frustrated Orientation:  Oriented to Self, Oriented to Place, Oriented to  Time, Oriented to Situation Alcohol / Substance use:  Not Applicable Psych involvement (Current and /or in the community):  No (Comment)  Discharge Needs  Concerns to be addressed:  Care Coordination Readmission within the last 30 days:  Yes Current discharge risk:  Dependent with Mobility, Lives alone Barriers to Discharge:  Continued Medical Work up   [employer], [place] 05/20/2018, 11:24 AM

## 2018-05-21 LAB — CULTURE, BLOOD (ROUTINE X 2)
CULTURE: NO GROWTH
Culture: NO GROWTH
SPECIAL REQUESTS: ADEQUATE
SPECIAL REQUESTS: ADEQUATE

## 2018-05-21 LAB — GLUCOSE, CAPILLARY
GLUCOSE-CAPILLARY: 231 mg/dL — AB (ref 65–99)
GLUCOSE-CAPILLARY: 225 mg/dL — AB (ref 65–99)
Glucose-Capillary: 190 mg/dL — ABNORMAL HIGH (ref 65–99)
Glucose-Capillary: 214 mg/dL — ABNORMAL HIGH (ref 65–99)

## 2018-05-21 MED ORDER — INSULIN ASPART 100 UNIT/ML ~~LOC~~ SOLN
4.0000 [IU] | Freq: Three times a day (TID) | SUBCUTANEOUS | Status: DC
  Administered 2018-05-21 – 2018-05-22 (×4): 4 [IU] via SUBCUTANEOUS

## 2018-05-21 MED ORDER — DEXAMETHASONE 0.5 MG PO TABS
1.0000 mg | ORAL_TABLET | Freq: Two times a day (BID) | ORAL | Status: DC
  Administered 2018-05-21 – 2018-05-22 (×3): 1 mg via ORAL
  Filled 2018-05-21 (×3): qty 2

## 2018-05-21 NOTE — Progress Notes (Signed)
OT Note - Addendum    05/21/18 1700  OT Visit Information  Last OT Received On 05/21/18  OT Time Calculation  OT Start Time (ACUTE ONLY) 1510  OT Stop Time (ACUTE ONLY) 1527  OT Time Calculation (min) 17 min  OT General Charges  $OT Visit 1 Visit  OT Treatments  $Self Care/Home Management  8-22 mins  Maurie Boettcher, OT/L  OT Clinical Specialist [phone]

## 2018-05-21 NOTE — Progress Notes (Signed)
Occupational Therapy Treatment Patient Details Name: Erika Fry MRN: [id number] DOB: 10-24-1946 Today's Date: 05/21/2018    History of present illness Pt adm from SNF with fatigue and weakness. Pt found to have acute blood loss anemia and lumbar spine acute osteomyelitis/discitis and rt psoas abcess. Pt with admit to [facility] 5/29-6/4 after fall with back pain. PMH - HTN, Pacer 4/19, ckd, dm, aortic stenosis   OT comments  Upon arrival, pt sitting in recliner. Today's session focused on education about AE maximize pt's independence with ADLs and UE exercises to assist with functional mobility for ADLs. Pt currently requires setup assist for grooming and UB bathing while seated and maxA for LB ADLs. Pt able to scoot to edge of recliner with increased time, due to pain level. Will continue to follow acutely and progress as tolerated.    Follow Up Recommendations  Supervision/Assistance - 24 hour;SNF    Equipment Recommendations  Other (comment)(TBD at next venue of care)    Recommendations for Other Services      Precautions / Restrictions Precautions Precautions: Fall Precaution Comments: Pacemaker placement 4 weeks ago Restrictions Weight Bearing Restrictions: No       Mobility Bed Mobility               General bed mobility comments: pt sitting in recliner upon arrival  Transfers                      Balance Overall balance assessment: Needs assistance Sitting-balance support: Feet supported;Bilateral upper extremity supported Sitting balance-Leahy Scale: Fair Sitting balance - Comments: scooted to edge of recliner with increased time, heavy reliance on BUE due to pain                                   ADL either performed or assessed with clinical judgement   ADL Overall ADL's : Needs assistance/impaired Eating/Feeding: Set up   Grooming: Set up;Sitting   Upper Body Bathing: Set up;Sitting   Lower Body Bathing: Sitting/lateral  leans;Maximal assistance Lower Body Bathing Details (indicate cue type and reason): pt able to reach knees Upper Body Dressing : Set up;Sitting   Lower Body Dressing: Maximal assistance;Sitting/lateral leans       Toileting- Clothing Manipulation and Hygiene: Maximal assistance;Total assistance               Vision       Perception     Praxis      Cognition Arousal/Alertness: Awake/alert Behavior During Therapy: WFL for tasks assessed/performed Overall Cognitive Status: Within Functional Limits for tasks assessed                                 General Comments: pt verbalized understanding of importance of HEP and therapy         Exercises Exercises: General Upper Extremity;General Lower Extremity General Exercises - Upper Extremity Chair Push Up: Seated;10 reps General Exercises - Lower Extremity Heel Slides: AROM;Both;10 reps;Seated Hip Flexion/Marching: AROM;Both;10 reps;Seated   Shoulder Instructions       General Comments began educated on use of AE to assist with ADLs;educated pt on importance of completing as much UB bathing and bed mobility as tolerated;educated pt on UB/LB exercises to do while sitting in recliner;educated pt on importance of positional changes;    Pertinent Vitals/ Pain  Pain Assessment: 0-10 Pain Score: 10-Worst pain ever Faces Pain Scale: Hurts even more Pain Location: rt hip and  back  Pain Descriptors / Indicators: Sharp;Grimacing;Guarding;Aching;Sore Pain Intervention(s): Limited activity within patient's tolerance;Monitored during session;Ice applied  Home Living                                          Prior Functioning/Environment              Frequency  Min 2X/week        Progress Toward Goals  OT Goals(current goals can now be found in the care plan section)  Progress towards OT goals: Progressing toward goals  Acute Rehab OT Goals Patient Stated Goal: to not be in  pain OT Goal Formulation: With patient Time For Goal Achievement: 06/04/18 Potential to Achieve Goals: Good ADL Goals Pt Will Perform Grooming: with min guard assist;sitting Pt Will Perform Upper Body Bathing: with min assist;sitting Pt Will Perform Lower Body Bathing: with max assist;with mod assist;sitting/lateral leans Pt Will Perform Upper Body Dressing: with min assist;sitting Pt Will Perform Lower Body Dressing: with min guard assist;sit to/from stand;with adaptive equipment Pt Will Transfer to Toilet: with max assist;with +2 assist;bedside commode Pt Will Perform Toileting - Clothing Manipulation and hygiene: with min guard assist;sit to/from stand Additional ADL Goal #1: pt will complete bed mobility with mod A to sit EOB in prep for grooming and ADL tasks  Plan Discharge plan needs to be updated    Co-evaluation                 AM-PAC PT "6 Clicks" Daily Activity     Outcome Measure   Help from another person eating meals?: None Help from another person taking care of personal grooming?: A Little Help from another person toileting, which includes using toliet, bedpan, or urinal?: Total Help from another person bathing (including washing, rinsing, drying)?: A Little Help from another person to put on and taking off regular upper body clothing?: A Little Help from another person to put on and taking off regular lower body clothing?: A Lot 6 Click Score: 16    End of Session Equipment Utilized During Treatment: Back brace  OT Visit Diagnosis: Unsteadiness on feet (R26.81);Pain;Other abnormalities of gait and mobility (R26.89);Muscle weakness (generalized) (M62.81) Pain - Right/Left: Right Pain - part of body: Hip   Activity Tolerance Patient limited by pain   Patient Left in chair;with call bell/phone within reach;with chair alarm set   Nurse Communication Mobility status;Need for lift equipment        Time: 5397-6734 OT Time Calculation (min): 17  min  Charges:    Dorinda Hill OTS     Dorinda Hill 05/21/2018, 5:06 PM

## 2018-05-21 NOTE — Progress Notes (Addendum)
Inpatient Diabetes Program Recommendations  AACE/ADA: New Consensus Statement on Inpatient Glycemic Control (2019)  Target Ranges:  Prepandial:   less than 140 mg/dL      Peak postprandial:   less than 180 mg/dL (1-2 hours)      Critically ill patients:  140 - 180 mg/dL   Results for KELSE, PLOCH (MRN [id number]) as of 05/21/2018 10:28  Ref. Range 05/20/2018 07:46 05/20/2018 12:15 05/20/2018 16:42 05/20/2018 21:13 05/21/2018 08:32  Glucose-Capillary Latest Ref Range: 65 - 99 mg/dL 260 (H) 267 (H) 307 (H) 331 (H) 190 (H)   Review of Glycemic Control  Current orders for Inpatient glycemic control: Lantus 20 units daily, Novolog 0-20 units TID with meals; Novolog 0-5 units QHS; Decadron 1 mg Q12H  Inpatient Diabetes Program Recommendations: Insulin - Meal Coverage: If Decadron is continued, please consider ordering Novolog 4 units TID with meals for meal coverage if patient east at least 50% of meals.  Thanks, Barnie Alderman, RN, MSN, CDE Diabetes Coordinator Inpatient Diabetes Program [phone] (Team Pager from 8am to 5pm)

## 2018-05-21 NOTE — PMR Pre-admission (Signed)
PMR Admission Coordinator Pre-Admission Assessment  Patient: Erika Fry is an 72 y.o., female MRN: [id number] DOB: 08/18/1946 Height: 5\' 5"  (165.1 cm) Weight: 102.1 kg (225 lb 1.4 oz)              Insurance Information HMO: Yes    PPO:      PCP:      IPA:      80/20:      OTHER:  PRIMARY: [employer] Medicare      Policy#: [id number]      Subscriber: Patient CM Name: Erika Fry     Phone#: [phone]     Fax#: [phone] Updates due : Update due to Erika Fry on day 7 of inpatient rehab (admit on 6/21 with update due 9/62/22) Pre-Cert#: [id number]      Employer:  Benefits:  Phone #: NA     Name: Online Portal: [email or web address] Eff. Date: 12/02/17     Deduct: $0      Out of Pocket Max: $6,700      Life Max: NA CIR: $430/day for days 1-4; $0/day for days 5+       SNF: $0/day for days 1-20, $160/day for days 21-62, $0/day for days 63-100; 100 day limit Outpatient: Per necessity   Co-Pay: $40/visit Home Health: Per necessity, 100% covered     DME: 80%     Co-Pay: 20% Providers:  Medicaid Application Date:       Case Manager:  Disability Application Date:       Case Worker:   Emergency Contact Information Contact Information    Name Relation Home Work Mobile   Fry,Erika Relative   [place] Son      [place], Erika Ed[place] Son      Fry, Erika Son        Current Medical History  Patient Admitting Diagnosis: Functional deficits secondary to L4-5 osteomyelitis and debility History of Present Illness: Erika Fry is a 72 y.o. female with history of HTN, CKD, T2DM, recent admission for fall due to UTI with back pain and was discharged to SNF on 05/05/18. MRI not done due to recent PPM 5/7 and baseline CKD. She was readmitted on 05/12/18 with acute on chronic anemia, reports of constipation and hypotension. She was treated with fluid bolus and PPI. MRI spine done revealing acute osteomyelitis of L4/5 with Paraspinous edema within right psoas muscle and Paraspinous  fluid collection as well as moderate to severe right and moderate left foraminal stenosis. Gi consulted for input and recommended supportive care till medical work up complete. She underwent aspiration of fluid collection and cultures negative.  ID consulted for input and recommended at least 6 weeks of IV  Ceftriaxone and renally dosed daptomycin.     She has been transfused with 6 units PRBC during her stay. She had had episodes of heme positive stools and underwent EGD revealing single friable gastric pylorus polyp that was biopsied by Dr. Fuller Plan.  To follow up on outpatient basis for colonoscopy and f/u EGD.  Therapy ongoing and patient limited by pain and fatigue. Family requesting CIR for follow up therapy. Pt to be admitted to CIR on 05/22/18.        Past Medical History  Past Medical History:  Diagnosis Date  . Arthritis    "knees, thumbs" (03/25/2018)  . CKD (chronic kidney disease), stage IV ([place])   . Diet-controlled diabetes mellitus ([place])   . GERD (gastroesophageal reflux disease)   . Gout    "  on daily RX" (03/25/2018)  . Heart murmur   . High cholesterol   . Hypertension   . Hypothyroidism   . Iron deficiency anemia    "had to get an iron infusion"  . Migraine    "used to have them growing up" (03/25/2018)  . Presence of permanent cardiac pacemaker 03/25/2018    Family History  family history includes Diabetes Mellitus II in her brother and father; Gastric cancer in her brother; Heart disease in her father; Hypertension in her mother.  Prior Rehab/Hospitalizations:  Has the patient had major surgery during 100 days prior to admission? Yes  Current Medications   Current Facility-Administered Medications:  .  0.9 %  sodium chloride infusion, , Intravenous, PRN, Shelly Coss, MD, Stopped at 05/20/18 2305 .  acetaminophen (TYLENOL) tablet 650 mg, 650 mg, Oral, Q6H, Bennie Pierini, MD, 650 mg at 05/21/18 1020 .  allopurinol (ZYLOPRIM) tablet 200 mg, 200 mg, Oral,  Daily, Bennie Pierini, MD, 200 mg at 05/21/18 0826 .  cefTRIAXone (ROCEPHIN) 2 g in sodium chloride 0.9 % 100 mL IVPB, 2 g, Intravenous, Q24H, Michel Bickers, MD, Last Rate: 200 mL/hr at 05/21/18 1318, 2 g at 05/21/18 1318 .  DAPTOmycin (CUBICIN) 780 mg in sodium chloride 0.9 % IVPB, 8 mg/kg, Intravenous, Q24H, Karren Cobble, RPH, Stopped at 05/20/18 2127 .  dexamethasone (DECADRON) tablet 1 mg, 1 mg, Oral, Q12H, Adhikari, Amrit, MD, 1 mg at 05/21/18 0826 .  fentaNYL (SUBLIMAZE) injection 12.5-25 mcg, 12.5-25 mcg, Intravenous, Q2H PRN, Cherene Altes, MD .  gabapentin (NEURONTIN) capsule 300 mg, 300 mg, Oral, BID, Tawanna Solo, Amrit, MD, 300 mg at 05/21/18 0826 .  insulin aspart (novoLOG) injection 0-20 Units, 0-20 Units, Subcutaneous, TID WC, Cherene Altes, MD, 7 Units at 05/21/18 1325 .  insulin aspart (novoLOG) injection 4 Units, 4 Units, Subcutaneous, TID WC, Shelly Coss, MD, 4 Units at 05/21/18 1317 .  insulin glargine (LANTUS) injection 20 Units, 20 Units, Subcutaneous, Daily, Shelly Coss, MD, 20 Units at 05/21/18 0826 .  levothyroxine (SYNTHROID, LEVOTHROID) tablet 75 mcg, 75 mcg, Oral, QAC breakfast, Bennie Pierini, MD, 75 mcg at 05/21/18 [phone]  lidocaine (PF) (XYLOCAINE) 1 % injection, , , PRN, Hoss, Arthur, MD, 5 mL at 05/18/18 1548 .  methocarbamol (ROBAXIN) tablet 500 mg, 500 mg, Oral, Q8H PRN, Hammonds, Sharyn Blitz, MD, 500 mg at 05/18/18 0052 .  ondansetron (ZOFRAN) tablet 4 mg, 4 mg, Oral, Q6H PRN **OR** ondansetron (ZOFRAN) injection 4 mg, 4 mg, Intravenous, Q6H PRN, Schertz, Michele Mcalpine, MD .  oxyCODONE (Oxy IR/ROXICODONE) immediate release tablet 5-10 mg, 5-10 mg, Oral, Q6H PRN, Cherene Altes, MD, 10 mg at 05/21/18 1020 .  pantoprazole (PROTONIX) EC tablet 40 mg, 40 mg, Oral, Q0600, Vena Rua, PA-C, 40 mg at 05/21/18 9604 .  polyethylene glycol (MIRALAX / GLYCOLAX) packet 17 g, 17 g, Oral, BID, Armbruster, Carlota Raspberry, MD, 17 g at 05/15/18 2258 .   simvastatin (ZOCOR) tablet 40 mg, 40 mg, Oral, QHS, Bennie Pierini, MD, 40 mg at 05/20/18 2209 .  sodium chloride flush (NS) 0.9 % injection 10-40 mL, 10-40 mL, Intracatheter, Q12H, Cherene Altes, MD, 20 mL at 05/21/18 0837 .  sodium chloride flush (NS) 0.9 % injection 3 mL, 3 mL, Intravenous, Q12H, Schertz, Michele Mcalpine, MD, 3 mL at 05/19/18 1628  Patients Current Diet:  Diet Order           Diet regular Room service appropriate? Yes; Fluid consistency: Thin  Diet effective now          Precautions / Restrictions Precautions Precautions: Fall Precaution Comments: Pacemaker placement 4 weeks ago Restrictions Weight Bearing Restrictions: No   Has the patient had 2 or more falls or a fall with injury in the past year?Yes; previous fall led to back pain and SNF need.   Prior Activity Level Community (5-7x/wk): Yes  Home Assistive Devices / Equipment Home Assistive Devices/Equipment: None Home Equipment: None  Prior Device Use: Indicate devices/aids used by the patient prior to current illness, exacerbation or injury? No AD use prior to admission  Prior Functional Level Prior Function Level of Independence: Needs assistance Gait / Transfers Assistance Needed: Independent prior to admission at the end of May after fall. Since then pt at most has amb very short (15') distance with walker and 2 person assist.  Self Care: Did the patient need help bathing, dressing, using the toilet or eating?  Independent  Indoor Mobility: Did the patient need assistance with walking from room to room (with or without device)? Independent  Stairs: Did the patient need assistance with internal or external stairs (with or without device)? Independent  Functional Cognition: Did the patient need help planning regular tasks such as shopping or remembering to take medications? Independent  Current Functional Level Cognition  Overall Cognitive Status: Within Functional Limits for tasks  assessed Orientation Level: Oriented X4    Extremity Assessment (includes Sensation/Coordination)  Upper Extremity Assessment: Generalized weakness  Lower Extremity Assessment: Defer to PT evaluation RLE Deficits / Details: grossly 2+ to 3-/5 LLE Deficits / Details: grossly 2+ to 3-/5    ADLs  Overall ADL's : Needs assistance/impaired Grooming: Sitting, Wash/dry hands, Wash/dry face, Minimal assistance Upper Body Bathing: Moderate assistance, Sitting Lower Body Bathing: Total assistance Upper Body Dressing : Sitting, Moderate assistance Lower Body Dressing: Total assistance Toilet Transfer Details (indicate cue type and reason): will need +2 assist Toileting- Clothing Manipulation and Hygiene: Total assistance, Bed level General ADL Comments: pt continues to have limitations due to pain and weakness, Poor endurance    Mobility  Overal bed mobility: Needs Assistance Bed Mobility: Rolling, Sidelying to Sit, Sit to Sidelying Rolling: Mod assist Sidelying to sit: Max assist, +2 for safety/equipment Sit to sidelying: Max assist, +2 for physical assistance General bed mobility comments: Assist to move legs, elevate trunk into sitting and bring hips to EOB. Assist to lower trunk and bring legs back up into bed.    Transfers  Overall transfer level: Needs assistance Transfer via Lift Equipment: Stedy Transfers: Sit to/from Stand Sit to Stand: +2 physical assistance, From elevated surface, Max assist General transfer comment: 2 attemtpts to stand with use of Stedy standing frame, gait belt, and bed pad to aid in hip extension; pt unable to achieve standing up enough for Stedy to be used for transfer; cues for sequencing and technique    Ambulation / Gait / Stairs / Office manager / Balance Dynamic Sitting Balance Sitting balance - Comments: maintains static sitting with close minguard for safety, Poor dynamic sitting. INcreased time and effort. Pt sat EOB x 15  minutes and required max A to sccot to [facility] before return to supine Balance Overall balance assessment: Needs assistance Sitting-balance support: Feet supported, Bilateral upper extremity supported Sitting balance-Leahy Scale: Poor Sitting balance - Comments: maintains static sitting with close minguard for safety, Poor dynamic sitting. INcreased time and effort. Pt sat EOB x 15 minutes and required max A  to sccot to [facility] before return to supine Standing balance support: Bilateral upper extremity supported Standing balance-Leahy Scale: Zero    Special needs/care consideration BiPAP/CPAP: No CPM: No Continuous Drip IV: No; pt is on IV Rocephin and Cubicin Dialysis: No        Days: NA Life Vest: No  Oxygen: No Special Bed: No Trach Size: No Wound Vac (area): No      Location: NA Skin: BUE bruising                              Location Bowel mgmt: Last BM 05/20/18 Bladder mgmt: using [place] on acute side; Usually continent  Diabetic mgmt: Yes, per pt, was under control with diet when home; Pt reported she has been given insulin in hospital due to uncontrolled levels.      Previous Home Environment Living Arrangements: Alone Available Help at Discharge: Family, Available PRN/intermittently Type of Home: House Home Layout: Two level, Able to live on main level with bedroom/bathroom Home Access: Stairs to enter Entrance Stairs-Rails: None Entrance Stairs-Number of Steps: 2 Bathroom Shower/Tub: Multimedia programmer: Handicapped height [place]: No  Discharge Living Setting Plans for Discharge Living Setting: Patient's home, Alone, House, Other (Comment)(plan is to have Museum/gallery conservator + family members for 24/7 A ) Type of Home at Discharge: [place]: One level Discharge Home Access: Stairs to enter Entrance Stairs-Rails: None Entrance Stairs-Number of Steps: 1 Discharge Bathroom Shower/Tub: Walk-in shower(has stool) Discharge Bathroom Toilet:  Standard Discharge Bathroom Accessibility: Yes How Accessible: Accessible via walker Does the patient have any problems obtaining your medications?: No  Social/Family/Support Systems 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; retired; works in yard) [place]: Emergency contact is to be Anderson [place] [phone] Anticipated Caregiver: Museum/gallery conservator + other dtr-in-laws and 3 sons Anticipated Ambulance person Information: Museum/gallery conservator (Dtr-in-law) [phone] Ability/Limitations of Caregiver: Museum/gallery conservator lives 5 mins away but can be there 24/7 Caregiver Availability: 24/7(Amber plus family members to make up 24/7 A) Discharge Plan Discussed with Primary Caregiver: Yes Is Caregiver In Agreement with Plan?: Yes Does Caregiver/Family have Issues with Lodging/Transportation while Pt is in Rehab?: No   Goals/Additional Needs Patient/Family Goal for Rehab: PT/OT: Sup/Min A; SLP: NA Expected length of stay: 15-20 days Cultural Considerations: Catholic Dietary Needs: regular, thin liquids Equipment Needs: TBD Special Service Needs: NA Additional Information: NA Pt/Family Agrees to Admission and willing to participate: Yes(Amber is dtr-in-law who plans to be 24/7 A plus family) Program Orientation Provided & Reviewed with Pt/Caregiver Including Roles  & Responsibilities: Yes(reviewed with family) Additional Information Needs: NA Information Needs to be Provided By: NA  Barriers to Discharge: Home environment access/layout, Lack of/limited family support  Barriers to Discharge Comments: 1 step to enter   Decrease burden of Care through IP rehab admission: NA   Possible need for SNF placement upon discharge: Not anticipated.    Patient Condition: This patient's condition remains as documented in the consult dated 05/20/18, in which the Rehabilitation Physician determined and documented that the patient's condition is appropriate for intensive rehabilitative care in an inpatient rehabilitation facility. Will  admit to inpatient rehab today.  Preadmission Screen Completed By:  [name], 05/21/2018 1:49 PM ______________________________________________________________________   Discussed status with Dr. Naaman Plummer on 05/22/18 at 12:18PM and received telephone approval for admission today.  Admission Coordinator:  [name], time 12:19pm Sudie Grumbling 05/22/18

## 2018-05-21 NOTE — Progress Notes (Addendum)
PROGRESS NOTE    Erika Fry  OVZ:[id number] DOB: 03-10-1946 DOA: 05/12/2018 PCP: [name], MD   Brief Narrative: Patient is a 72yo F w/ a Hx of second-degree AV block S/P permanent pacemakerplacement on 04/07/2018, HTN, CKD stage IV, Hypothyroidism, DM2,  Iron Deficiency anemia.  She was discharged to a SNF on 6/4after being admitted 5/29 following a mechanical fall which resulted in severe back pain and difficulty ambulating.  She was started on a steroid Dosepak and discharged to SNF.  MRI was not obtained due to recent placement of permanent pacemaker and CT myelogram was also contaminated due to patient's baseline CKD.  She continued to have back pain and also began to experience fatigue and weakness. In the ED she was found to have a hemoglobin of 4.1 (was 8.3 on 6/4), and a BUN of 158, w/ creatinine 3.62.  She was started on Protonix, given 2 units of PRBCs.  GI consulted.  At the meantime, MRI lumbar spine showed lumbar discitis.  ID was consulted and started on antibiotics which will be continued for total of 6 weeks.    Assessment & Plan:   Principal Problem:   Lumbar discitis Active Problems:   Mobitz type 2 second degree AV block   CKD (chronic kidney disease), stage IV (HCC)   Diet-controlled diabetes mellitus (HCC)   GERD (gastroesophageal reflux disease)   Gout   High cholesterol   Hypothyroidism   Essential hypertension   Blood loss anemia   Occult blood in stools   Gastric polyp   Acute blood loss anemia /Anemia of chronic kidney disease: S/p transfusion of 6U PRBC so far on this admission .  Currently hemoglobin is stable .  We will continue to monitor.  UGIB: GI following - EGD 6/18 noted one oozing gastric polyp - path report pending - to have outpt colo and f/u EGD as an outpatient.  L4/L5 Acute discitis/osteomyelitis: Lumbar nerve root impingement: To complete 6 weeks of IV abx tx - lumbar aspirate cultures negative but received 48hrs of abx  prior to procedure - ID recommending total 6 weeks of daptomycin and ceftriaxone which will be continued until July 24.  Patient already underwent  tunneled PICC line.We will plan to wean decadron to off.  She will follow-up with infectious disease as an outpatient  CKD stage IV: Baseline Cr 3.0-4.0 - renal fxn currently signifcantly  better than her baseline   Hyponatremia: Follow trend   2:1 heart block: Status post pacemaker placement  Hypothyroidism: Cont usual synthroid dose   DM 2: Uncontrolled blood glucose most likely secondary to steroids.  We will continue Lantus and sliding scale insulin..   Disposition: Skilled nursing facility versus inpatient rehab.  We have requested for inpatient rehab consultation.SW following/rehab following.  Waiting for authorization.  DVT prophylaxis: SCD Code Status: Full Family Communication: None present at the bedside Disposition Plan: To CIR as soon as the bed is available  Consultants:   Procedures: 6/14 aspiration of L4/L5 for culture 6/17 tunneled R IJ PICC in IR 6/18 EGD   Antimicrobials: Daptomycin, ceftriaxone  Subjective: Patient seen and examined the bedside this morning.  Remains comfortable.  No new issues/events.  Denies any back pain.  She is really hoping to be discharged inpatient rehab.  Objective: Vitals:   05/20/18 1508 05/20/18 2112 05/21/18 0446 05/21/18 0500  BP: (!) 141/73 (!) 157/66 (!) 144/79   Pulse: 72 66 76   Resp: 16 20 12    Temp: (!) 97.5 F (36.4  C) 98.3 F (36.8 C) 98.1 F (36.7 C)   TempSrc: Oral     SpO2: 98% 100% 98%   Weight:    102.1 kg (225 lb 1.4 oz)  Height:        Intake/Output Summary (Last 24 hours) at 05/21/2018 1121 Last data filed at 05/21/2018 0320 Gross per 24 hour  Intake 743.56 ml  Output 900 ml  Net -156.44 ml   Filed Weights   05/19/18 0500 05/19/18 1209 05/21/18 0500  Weight: 97.6 kg (215 lb 2.7 oz) 97.5 kg (215 lb) 102.1 kg (225 lb 1.4 oz)     Examination:  General exam: Appears calm and comfortable ,Not in distress,obese HEENT:PERRL,Oral mucosa moist, Ear/Nose normal on gross exam Respiratory system: Bilateral equal air entry, normal vesicular breath sounds, no wheezes or crackles  Cardiovascular system: S1 & S2 heard, RRR. No JVD, murmurs, rubs, gallops or clicks. Gastrointestinal system: Abdomen is nondistended, soft and nontender. No organomegaly or masses felt. Normal bowel sounds heard. Central nervous system: Alert and oriented. No focal neurological deficits. Extremities: No edema, no clubbing ,no cyanosis, distal peripheral pulses palpable. Skin: No rashes, lesions or ulcers,no icterus ,no pallor MSK: Normal muscle bulk,tone ,power,Lumbar belt Psychiatry: Judgement and insight appear normal. Mood & affect appropriate.       Data Reviewed: I have personally reviewed following labs and imaging studies  CBC: Recent Labs  Lab 05/14/18 1151  05/16/18 0517 05/17/18 0205 05/18/18 0434 05/19/18 0459 05/20/18 0439  WBC 12.0*   < > 8.6 8.0 8.6 8.6 10.1  NEUTROABS 11.3*  --   --   --   --   --   --   HGB 7.5*   < > 9.8* 10.7* 10.2* 10.1* 9.7*  HCT 22.1*   < > 28.8* 32.2* 30.1* 30.2* 29.0*  MCV 87.4   < > 86.5 88.2 87.8 87.5 89.2  PLT 288   < > 234 237 197 180 153   < > = values in this interval not displayed.   Basic Metabolic Panel: Recent Labs  Lab 05/15/18 0652 05/16/18 0517 05/17/18 0205 05/18/18 0434 05/19/18 0459 05/20/18 0439  NA 131* 131* 129* 132* 131* 132*  K 3.6 4.0 4.0 4.3 4.3 4.4  CL 99* 98* 95* 101 102 103  CO2 21* 22 19* 21* 20* 22  GLUCOSE 230* 238* 291* 223* 216* 292*  BUN 122* 113* 106* 86* 73* 68*  CREATININE 3.03* 2.80* 2.83* 2.51* 1.92* 1.90*  CALCIUM 9.4 9.5 9.4 9.5 9.0 8.6*  MG 2.4 2.4 2.1 2.1  --   --    GFR: Estimated Creatinine Clearance: 30.8 mL/min (A) (by C-G formula based on SCr of 1.9 mg/dL (H)). Liver Function Tests: Recent Labs  Lab 05/19/18 0459  AST 25   ALT 19  ALKPHOS 52  BILITOT 0.7  PROT 4.8*  ALBUMIN 2.3*   No results for input(s): LIPASE, AMYLASE in the last 168 hours. No results for input(s): AMMONIA in the last 168 hours. Coagulation Profile: Recent Labs  Lab 05/14/18 1151  INR 1.01   Cardiac Enzymes: No results for input(s): CKTOTAL, CKMB, CKMBINDEX, TROPONINI in the last 168 hours. BNP (last 3 results) No results for input(s): PROBNP in the last 8760 hours. HbA1C: No results for input(s): HGBA1C in the last 72 hours. CBG: Recent Labs  Lab 05/20/18 0746 05/20/18 1215 05/20/18 1642 05/20/18 2113 05/21/18 0832  GLUCAP 260* 267* 307* 331* 190*   Lipid Profile: No results for input(s): CHOL, HDL, LDLCALC, TRIG, CHOLHDL,  LDLDIRECT in the last 72 hours. Thyroid Function Tests: No results for input(s): TSH, T4TOTAL, FREET4, T3FREE, THYROIDAB in the last 72 hours. Anemia Panel: No results for input(s): VITAMINB12, FOLATE, FERRITIN, TIBC, IRON, RETICCTPCT in the last 72 hours. Sepsis Labs: No results for input(s): PROCALCITON, LATICACIDVEN in the last 168 hours.  Recent Results (from the past 240 hour(s))  Fungus Culture With Stain     Status: None (Preliminary result)   Collection Time: 05/15/18  3:09 PM  Result Value Ref Range Status   Fungus Stain Final report  Final    Comment: (NOTE) Performed At: [facility] [place], [place] [id number] Rush Farmer MD ZW:[id number]    Fungus (Mycology) Culture PENDING  Incomplete   Fungal Source ABSCESS  Final    Comment: LUMBAR ASPIRATE Performed at [facility], [place] [address] [place], [place] [address]   Aerobic/Anaerobic Culture (surgical/deep wound)     Status: None   Collection Time: 05/15/18  3:09 PM  Result Value Ref Range Status   Specimen Description ABSCESS  Final   Special Requests LUMBAR ASPIRATE  Final   Gram Stain   Final    FEW WBC PRESENT,BOTH PMN AND MONONUCLEAR NO ORGANISMS SEEN    Culture   Final    No growth  aerobically or anaerobically. Performed at [facility], [place] [address], [place], [place] [address]    Report Status 05/20/2018 FINAL  Final  Fungus Culture Result     Status: None   Collection Time: 05/15/18  3:09 PM  Result Value Ref Range Status   Result 1 Comment  Final    Comment: (NOTE) KOH/Calcofluor preparation:  no fungus observed. Performed At: [facility] [place], [place] [id number] Rush Farmer MD PJ:[id number] Performed at [facility], [place] [address], [place],  [address]   Culture, blood (routine x 2)     Status: None (Preliminary result)   Collection Time: 05/16/18  9:41 PM  Result Value Ref Range Status   Specimen Description BLOOD LEFT ANTECUBITAL  Final   Special Requests   Final    BOTTLES DRAWN AEROBIC AND ANAEROBIC Blood Culture adequate volume   Culture NO GROWTH 4 DAYS  Final   Report Status PENDING  Incomplete  Culture, blood (routine x 2)     Status: None (Preliminary result)   Collection Time: 05/16/18  9:41 PM  Result Value Ref Range Status   Specimen Description BLOOD LEFT ANTECUBITAL  Final   Special Requests   Final    BOTTLES DRAWN AEROBIC AND ANAEROBIC Blood Culture adequate volume   Culture NO GROWTH 4 DAYS  Final   Report Status PENDING  Incomplete         Radiology Studies: No results found.      Scheduled Meds: . acetaminophen  650 mg Oral Q6H  . allopurinol  200 mg Oral Daily  . dexamethasone  1 mg Oral Q12H  . gabapentin  300 mg Oral BID  . insulin aspart  0-20 Units Subcutaneous TID WC  . insulin aspart  4 Units Subcutaneous TID WC  . insulin glargine  20 Units Subcutaneous Daily  . levothyroxine  75 mcg Oral QAC breakfast  . pantoprazole  40 mg Oral Q0600  . polyethylene glycol  17 g Oral BID  . simvastatin  40 mg Oral QHS  . sodium chloride flush  10-40 mL Intracatheter Q12H  . sodium chloride flush  3 mL Intravenous Q12H  Continuous Infusions: . sodium chloride Stopped  (05/20/18 2305)  . cefTRIAXone (ROCEPHIN)  IV Stopped (05/20/18 1307)  . DAPTOmycin (CUBICIN)  IV Stopped (05/20/18 2127)     LOS: 9 days    Time spent: 25 mins.More than 50% of that time was spent in counseling and/or coordination of care.      Shelly Coss, MD [facility] Pager [phone]  If 7PM-7AM, please contact night-coverage [email or web address] Password [facility] 05/21/2018, 11:21 AM

## 2018-05-21 NOTE — Care Management Important Message (Signed)
Important Message  Patient Details  Name: Erika Fry MRN: [id number] Date of Birth: [birth year]   Medicare Important Message Given:  Yes    Barb Merino [place] 05/21/2018, 3:13 PM

## 2018-05-21 NOTE — Progress Notes (Addendum)
Physical Therapy Treatment Patient Details Name: Erika Fry MRN: [id number] DOB: Oct 13, 1946 Today's Date: 05/21/2018    History of Present Illness Pt adm from SNF with fatigue and weakness. Pt found to have acute blood loss anemia and lumbar spine acute osteomyelitis/discitis and rt psoas abcess. Pt with admit to [facility] 5/29-6/4 after fall with back pain. PMH - HTN, Pacer 4/19, ckd, dm, aortic stenosis    PT Comments    Patient requesting to trial walker vs Stedy again for transfer since she initially stated she had better pain control. However, patient unable to achieve significant hip clearance with two person total assistance and was limited by right hip pain. Performed low pivot from bed to chair with two person maximal assistance. Placed lift pad underneath patient for maximove transfer back to bed. Could trial slideboard transfer on next attempt.    Follow Up Recommendations  SNF     Equipment Recommendations  Wheelchair (measurements PT);Wheelchair cushion (measurements PT)    Recommendations for Other Services       Precautions / Restrictions Precautions Precautions: Fall Precaution Comments: Pacemaker placement 4 weeks ago Required Braces or Orthoses: Spinal Brace((for comfort)) Restrictions Weight Bearing Restrictions: No    Mobility  Bed Mobility Overal bed mobility: Needs Assistance Bed Mobility: Supine to Sit     Supine to sit: Max assist;+2 for physical assistance     General bed mobility comments: patient with max cueing to progress BLE's to edge of bed and then max assistance to elevate trunk. however, patient then had decreased control back onto bed and required maxA + 2 to elevate again and then use of bed pad to move hips to edge of bed   Transfers Overall transfer level: Needs assistance Equipment used: 2 person hand held assist Transfers: Sit to/from [employer] Sit to Stand: +2 physical assistance;From elevated surface;Total  assist   Squat pivot transfers: +2 physical assistance;Max assist     General transfer comment: Initially trialed RW on patient request but patient unable to achieve hip clearance. Then trialed Stedy but unfortunately due to patient lack of push off was unable to pull up significantly. Therefore, required max assistance + 2 for low pivot from bed to chair.  Ambulation/Gait                 Stairs             Wheelchair Mobility    Modified Rankin (Stroke Patients Only)       Balance Overall balance assessment: Needs assistance Sitting-balance support: Feet supported;Bilateral upper extremity supported Sitting balance-Leahy Scale: Poor Sitting balance - Comments: required heavy BUE support to maintain static sitting balance.                                    Cognition Arousal/Alertness: Awake/alert Behavior During Therapy: WFL for tasks assessed/performed Overall Cognitive Status: Within Functional Limits for tasks assessed                                 General Comments: pt verbalized understanding of importance of HEP and therapy       Exercises General Exercises - Upper Extremity Chair Push Up: Seated;10 reps General Exercises - Lower Extremity Heel Slides: AROM;10 reps;Seated Hip Flexion/Marching: AROM;Both;10 reps;Seated Other Exercises Other Exercises: Seated calf raises x 20 Other Exercises: Towel squeezes x 20 Other  Exercises: Practiced reciprocal scooting edge of bed with cueing/demonstration for technique as well as lateral scooting with use of BUE for push off    General Comments General comments (skin integrity, edema, etc.): began educated on use of AE to assist with ADLs;educated pt on importance of completing as much UB bathing and bed mobility as tolerated;educated pt on UB/LB exercises to do while sitting in recliner;educated pt on importance of positional changes;      Pertinent Vitals/Pain Pain Assessment:  0-10 Pain Score: 10-Worst pain ever Faces Pain Scale: Hurts even more Pain Location: rt hip and  back  Pain Descriptors / Indicators: Sharp;Grimacing;Guarding;Aching;Sore Pain Intervention(s): Limited activity within patient's tolerance;Monitored during session;Repositioned    Home Living                      Prior Function            PT Goals (current goals can now be found in the care plan section) Acute Rehab PT Goals Patient Stated Goal: to not be in pain    Frequency    Min 3X/week      PT Plan Current plan remains appropriate    Co-evaluation              AM-PAC PT "6 Clicks" Daily Activity  Outcome Measure  Difficulty turning over in bed (including adjusting bedclothes, sheets and blankets)?: Unable Difficulty moving from lying on back to sitting on the side of the bed? : Unable Difficulty sitting down on and standing up from a chair with arms (e.g., wheelchair, bedside commode, etc,.)?: Unable Help needed moving to and from a bed to chair (including a wheelchair)?: A Lot Help needed walking in hospital room?: Total Help needed climbing 3-5 steps with a railing? : Total 6 Click Score: 7    End of Session Equipment Utilized During Treatment: Gait belt Activity Tolerance: Patient limited by pain Patient left: in chair;with call bell/phone within reach;with nursing/sitter in room   PT Visit Diagnosis: Other abnormalities of gait and mobility (R26.89);History of falling (Z91.81);Muscle weakness (generalized) (M62.81);Pain Pain - Right/Left: Right Pain - part of body: Hip     Time: 0300-9233 PT Time Calculation (min) (ACUTE ONLY): 36 min  Charges:  $Therapeutic Exercise: 8-22 mins $Therapeutic Activity: 8-22 mins                    G Codes:       Ellamae Sia, PT, DPT Acute Rehabilitation Services  Pager: [place] 05/21/2018, 5:57 PM

## 2018-05-21 NOTE — Progress Notes (Signed)
Inpatient Rehabilitation-Admissions Coordinator    Met with patient at the bedside to discuss team's recommendation for inpatient rehabilitation. Shared booklets, expectations while in CIR, expected length of stay, and anticipated functional level at DC. Pt understands she may need 24/7 A at home after DC from CIR and [facility] has confirmed plan with family. Both pt and family wanting to pursue CIR. Plan to follow for timing of medical readiness, insurance authorization, and IP Rehab bed availability. Call if questions.   [name], OTR/L  Rehab Admissions Coordinator  [phone] 05/21/2018 11:17 AM

## 2018-05-22 ENCOUNTER — Inpatient Hospital Stay (HOSPITAL_COMMUNITY)
Admission: RE | Admit: 2018-05-22 | Discharge: 2018-06-22 | DRG: 091 | Disposition: A | Discharge status: Home Health | Payer: Medicare Other | Source: Intra-hospital | Attending: Physical Medicine & Rehabilitation | Admitting: Physical Medicine & Rehabilitation

## 2018-05-22 ENCOUNTER — Other Ambulatory Visit: Payer: Self-pay

## 2018-05-22 ENCOUNTER — Encounter (HOSPITAL_COMMUNITY): Payer: Self-pay

## 2018-05-22 DIAGNOSIS — J82 Pulmonary eosinophilia, not elsewhere classified: Secondary | ICD-10-CM | POA: Diagnosis present

## 2018-05-22 DIAGNOSIS — J189 Pneumonia, unspecified organism: Secondary | ICD-10-CM | POA: Diagnosis not present

## 2018-05-22 DIAGNOSIS — E871 Hypo-osmolality and hyponatremia: Secondary | ICD-10-CM

## 2018-05-22 DIAGNOSIS — K317 Polyp of stomach and duodenum: Secondary | ICD-10-CM

## 2018-05-22 DIAGNOSIS — R001 Bradycardia, unspecified: Secondary | ICD-10-CM | POA: Diagnosis not present

## 2018-05-22 DIAGNOSIS — I129 Hypertensive chronic kidney disease with stage 1 through stage 4 chronic kidney disease, or unspecified chronic kidney disease: Secondary | ICD-10-CM | POA: Diagnosis present

## 2018-05-22 DIAGNOSIS — E8809 Other disorders of plasma-protein metabolism, not elsewhere classified: Secondary | ICD-10-CM

## 2018-05-22 DIAGNOSIS — R600 Localized edema: Secondary | ICD-10-CM

## 2018-05-22 DIAGNOSIS — N179 Acute kidney failure, unspecified: Secondary | ICD-10-CM | POA: Diagnosis not present

## 2018-05-22 DIAGNOSIS — D508 Other iron deficiency anemias: Secondary | ICD-10-CM

## 2018-05-22 DIAGNOSIS — M25551 Pain in right hip: Secondary | ICD-10-CM | POA: Diagnosis not present

## 2018-05-22 DIAGNOSIS — M4626 Osteomyelitis of vertebra, lumbar region: Secondary | ICD-10-CM | POA: Diagnosis present

## 2018-05-22 DIAGNOSIS — Z6834 Body mass index (BMI) 34.0-34.9, adult: Secondary | ICD-10-CM

## 2018-05-22 DIAGNOSIS — I10 Essential (primary) hypertension: Secondary | ICD-10-CM | POA: Diagnosis not present

## 2018-05-22 DIAGNOSIS — G9340 Encephalopathy, unspecified: Secondary | ICD-10-CM | POA: Diagnosis present

## 2018-05-22 DIAGNOSIS — Y95 Nosocomial condition: Secondary | ICD-10-CM | POA: Diagnosis not present

## 2018-05-22 DIAGNOSIS — D696 Thrombocytopenia, unspecified: Secondary | ICD-10-CM | POA: Diagnosis present

## 2018-05-22 DIAGNOSIS — M5441 Lumbago with sciatica, right side: Secondary | ICD-10-CM | POA: Diagnosis not present

## 2018-05-22 DIAGNOSIS — G253 Myoclonus: Secondary | ICD-10-CM | POA: Diagnosis present

## 2018-05-22 DIAGNOSIS — E875 Hyperkalemia: Secondary | ICD-10-CM | POA: Diagnosis present

## 2018-05-22 DIAGNOSIS — Z833 Family history of diabetes mellitus: Secondary | ICD-10-CM | POA: Diagnosis not present

## 2018-05-22 DIAGNOSIS — N184 Chronic kidney disease, stage 4 (severe): Secondary | ICD-10-CM | POA: Diagnosis present

## 2018-05-22 DIAGNOSIS — D509 Iron deficiency anemia, unspecified: Secondary | ICD-10-CM | POA: Diagnosis present

## 2018-05-22 DIAGNOSIS — R2689 Other abnormalities of gait and mobility: Principal | ICD-10-CM | POA: Diagnosis present

## 2018-05-22 DIAGNOSIS — M4646 Discitis, unspecified, lumbar region: Secondary | ICD-10-CM | POA: Diagnosis present

## 2018-05-22 DIAGNOSIS — F419 Anxiety disorder, unspecified: Secondary | ICD-10-CM | POA: Diagnosis present

## 2018-05-22 DIAGNOSIS — M5416 Radiculopathy, lumbar region: Secondary | ICD-10-CM | POA: Diagnosis present

## 2018-05-22 DIAGNOSIS — R739 Hyperglycemia, unspecified: Secondary | ICD-10-CM

## 2018-05-22 DIAGNOSIS — E119 Type 2 diabetes mellitus without complications: Secondary | ICD-10-CM | POA: Diagnosis not present

## 2018-05-22 DIAGNOSIS — T380X5A Adverse effect of glucocorticoids and synthetic analogues, initial encounter: Secondary | ICD-10-CM | POA: Diagnosis not present

## 2018-05-22 DIAGNOSIS — I169 Hypertensive crisis, unspecified: Secondary | ICD-10-CM | POA: Diagnosis not present

## 2018-05-22 DIAGNOSIS — R400 Somnolence: Secondary | ICD-10-CM

## 2018-05-22 DIAGNOSIS — E1122 Type 2 diabetes mellitus with diabetic chronic kidney disease: Secondary | ICD-10-CM | POA: Diagnosis present

## 2018-05-22 DIAGNOSIS — R601 Generalized edema: Secondary | ICD-10-CM | POA: Diagnosis not present

## 2018-05-22 DIAGNOSIS — M47816 Spondylosis without myelopathy or radiculopathy, lumbar region: Secondary | ICD-10-CM | POA: Diagnosis present

## 2018-05-22 DIAGNOSIS — D62 Acute posthemorrhagic anemia: Secondary | ICD-10-CM | POA: Diagnosis present

## 2018-05-22 DIAGNOSIS — M4640 Discitis, unspecified, site unspecified: Secondary | ICD-10-CM | POA: Diagnosis not present

## 2018-05-22 DIAGNOSIS — E876 Hypokalemia: Secondary | ICD-10-CM | POA: Diagnosis not present

## 2018-05-22 DIAGNOSIS — M48061 Spinal stenosis, lumbar region without neurogenic claudication: Secondary | ICD-10-CM | POA: Diagnosis present

## 2018-05-22 DIAGNOSIS — E039 Hypothyroidism, unspecified: Secondary | ICD-10-CM | POA: Diagnosis present

## 2018-05-22 DIAGNOSIS — N189 Chronic kidney disease, unspecified: Secondary | ICD-10-CM

## 2018-05-22 DIAGNOSIS — E1165 Type 2 diabetes mellitus with hyperglycemia: Secondary | ICD-10-CM | POA: Diagnosis not present

## 2018-05-22 DIAGNOSIS — R609 Edema, unspecified: Secondary | ICD-10-CM | POA: Diagnosis not present

## 2018-05-22 DIAGNOSIS — E46 Unspecified protein-calorie malnutrition: Secondary | ICD-10-CM | POA: Diagnosis present

## 2018-05-22 DIAGNOSIS — E7800 Pure hypercholesterolemia, unspecified: Secondary | ICD-10-CM | POA: Diagnosis present

## 2018-05-22 DIAGNOSIS — K219 Gastro-esophageal reflux disease without esophagitis: Secondary | ICD-10-CM | POA: Diagnosis present

## 2018-05-22 DIAGNOSIS — E8770 Fluid overload, unspecified: Secondary | ICD-10-CM | POA: Diagnosis present

## 2018-05-22 DIAGNOSIS — R410 Disorientation, unspecified: Secondary | ICD-10-CM | POA: Diagnosis not present

## 2018-05-22 DIAGNOSIS — E1169 Type 2 diabetes mellitus with other specified complication: Secondary | ICD-10-CM | POA: Diagnosis present

## 2018-05-22 DIAGNOSIS — R52 Pain, unspecified: Secondary | ICD-10-CM

## 2018-05-22 DIAGNOSIS — E1141 Type 2 diabetes mellitus with diabetic mononeuropathy: Secondary | ICD-10-CM | POA: Diagnosis present

## 2018-05-22 DIAGNOSIS — R918 Other nonspecific abnormal finding of lung field: Secondary | ICD-10-CM | POA: Diagnosis not present

## 2018-05-22 DIAGNOSIS — M5410 Radiculopathy, site unspecified: Secondary | ICD-10-CM

## 2018-05-22 DIAGNOSIS — Z950 Presence of cardiac pacemaker: Secondary | ICD-10-CM | POA: Diagnosis not present

## 2018-05-22 DIAGNOSIS — R0602 Shortness of breath: Secondary | ICD-10-CM

## 2018-05-22 DIAGNOSIS — M7989 Other specified soft tissue disorders: Secondary | ICD-10-CM | POA: Diagnosis not present

## 2018-05-22 DIAGNOSIS — R05 Cough: Secondary | ICD-10-CM | POA: Diagnosis not present

## 2018-05-22 DIAGNOSIS — D638 Anemia in other chronic diseases classified elsewhere: Secondary | ICD-10-CM

## 2018-05-22 DIAGNOSIS — Z6837 Body mass index (BMI) 37.0-37.9, adult: Secondary | ICD-10-CM

## 2018-05-22 DIAGNOSIS — G479 Sleep disorder, unspecified: Secondary | ICD-10-CM | POA: Diagnosis not present

## 2018-05-22 DIAGNOSIS — Z79899 Other long term (current) drug therapy: Secondary | ICD-10-CM

## 2018-05-22 LAB — CBC WITH DIFFERENTIAL/PLATELET
Abs Immature Granulocytes: 0.1 10*3/uL (ref 0.0–0.1)
BASOS PCT: 0 %
Basophils Absolute: 0 10*3/uL (ref 0.0–0.1)
EOS ABS: 0.2 10*3/uL (ref 0.0–0.7)
Eosinophils Relative: 2 %
HCT: 26.6 % — ABNORMAL LOW (ref 36.0–46.0)
Hemoglobin: 8.7 g/dL — ABNORMAL LOW (ref 12.0–15.0)
Immature Granulocytes: 1 %
Lymphocytes Relative: 5 %
Lymphs Abs: 0.4 10*3/uL — ABNORMAL LOW (ref 0.7–4.0)
MCH: 29.3 pg (ref 26.0–34.0)
MCHC: 32.7 g/dL (ref 30.0–36.0)
MCV: 89.6 fL (ref 78.0–100.0)
MONO ABS: 0.6 10*3/uL (ref 0.1–1.0)
MONOS PCT: 7 %
Neutro Abs: 8.1 10*3/uL — ABNORMAL HIGH (ref 1.7–7.7)
Neutrophils Relative %: 85 %
PLATELETS: 143 10*3/uL — AB (ref 150–400)
RBC: 2.97 MIL/uL — ABNORMAL LOW (ref 3.87–5.11)
RDW: 16.3 % — AB (ref 11.5–15.5)
WBC: 9.4 10*3/uL (ref 4.0–10.5)

## 2018-05-22 LAB — BASIC METABOLIC PANEL
Anion gap: 5 (ref 5–15)
BUN: 55 mg/dL — AB (ref 6–20)
CALCIUM: 8.5 mg/dL — AB (ref 8.9–10.3)
CO2: 20 mmol/L — ABNORMAL LOW (ref 22–32)
Chloride: 106 mmol/L (ref 101–111)
Creatinine, Ser: 1.69 mg/dL — ABNORMAL HIGH (ref 0.44–1.00)
GFR calc Af Amer: 33 mL/min — ABNORMAL LOW (ref >60)
GFR, EST NON AFRICAN AMERICAN: 29 mL/min — AB (ref >60)
GLUCOSE: 178 mg/dL — AB (ref 65–99)
Potassium: 4.8 mmol/L (ref 3.5–5.1)
Sodium: 131 mmol/L — ABNORMAL LOW (ref 135–145)

## 2018-05-22 LAB — GLUCOSE, CAPILLARY
GLUCOSE-CAPILLARY: 152 mg/dL — AB (ref 65–99)
GLUCOSE-CAPILLARY: 200 mg/dL — AB (ref 65–99)
Glucose-Capillary: 142 mg/dL — ABNORMAL HIGH (ref 65–99)
Glucose-Capillary: 182 mg/dL — ABNORMAL HIGH (ref 65–99)
Glucose-Capillary: 170 mg/dL — ABNORMAL HIGH (ref 65–99)

## 2018-05-22 LAB — CK: Total CK: 139 U/L (ref 38–234)

## 2018-05-22 MED ORDER — SODIUM CHLORIDE 0.9 % IV SOLN
8.0000 mg/kg | INTRAVENOUS | Status: DC
  Administered 2018-05-22 – 2018-06-06 (×15): 780 mg via INTRAVENOUS
  Filled 2018-05-22 (×16): qty 15.6

## 2018-05-22 MED ORDER — FLEET ENEMA 7-19 GM/118ML RE ENEM: 1.0000 | ENEMA | Freq: Once | RECTAL | Status: DC | PRN

## 2018-05-22 MED ORDER — OXYCODONE HCL 5 MG PO TABS
10.0000 mg | ORAL_TABLET | Freq: Two times a day (BID) | ORAL | Status: DC
  Administered 2018-05-23 – 2018-05-28 (×11): 10 mg via ORAL
  Filled 2018-05-22 (×11): qty 2

## 2018-05-22 MED ORDER — HYDRALAZINE HCL 50 MG PO TABS
50.0000 mg | ORAL_TABLET | Freq: Three times a day (TID) | ORAL | Status: DC
  Administered 2018-05-22 – 2018-05-25 (×8): 50 mg via ORAL
  Filled 2018-05-22 (×8): qty 1

## 2018-05-22 MED ORDER — GUAIFENESIN-DM 100-10 MG/5ML PO SYRP: 5.0000 mL | ORAL_SOLUTION | Freq: Four times a day (QID) | ORAL | Status: DC | PRN

## 2018-05-22 MED ORDER — POLYETHYLENE GLYCOL 3350 17 G PO PACK
17.0000 g | PACK | Freq: Every day | ORAL | Status: DC | PRN
  Administered 2018-06-06 – 2018-06-07 (×2): 17 g via ORAL
  Filled 2018-05-22 (×2): qty 1

## 2018-05-22 MED ORDER — SODIUM CHLORIDE 0.9 % IV SOLN: 2.0000 g | INTRAVENOUS | 0 refills | Status: DC

## 2018-05-22 MED ORDER — METHOCARBAMOL 500 MG PO TABS: 500.0000 mg | ORAL_TABLET | Freq: Three times a day (TID) | ORAL | Status: DC | PRN

## 2018-05-22 MED ORDER — VITAMIN B-12 1000 MCG PO TABS
1000.0000 ug | ORAL_TABLET | Freq: Every day | ORAL | Status: DC
  Administered 2018-05-23 – 2018-06-22 (×30): 1000 ug via ORAL
  Filled 2018-05-22 (×31): qty 1

## 2018-05-22 MED ORDER — FENTANYL CITRATE (PF) 100 MCG/2ML IJ SOLN: 12.5000 ug | INTRAMUSCULAR | 0 refills | Status: DC | PRN

## 2018-05-22 MED ORDER — OXYCODONE HCL 5 MG PO TABS: 5.0000 mg | ORAL_TABLET | Freq: Four times a day (QID) | ORAL | 0 refills | Status: DC | PRN

## 2018-05-22 MED ORDER — METHOCARBAMOL 500 MG PO TABS
500.0000 mg | ORAL_TABLET | Freq: Three times a day (TID) | ORAL | Status: DC | PRN
  Administered 2018-05-22 – 2018-05-23 (×2): 500 mg via ORAL
  Filled 2018-05-22 (×3): qty 1

## 2018-05-22 MED ORDER — FLUTICASONE PROPIONATE 50 MCG/ACT NA SUSP
2.0000 | Freq: Every day | NASAL | Status: DC | PRN
  Administered 2018-05-31 – 2018-06-07 (×7): 2 via NASAL
  Filled 2018-05-22 (×2): qty 16

## 2018-05-22 MED ORDER — PANTOPRAZOLE SODIUM 40 MG PO TBEC
40.0000 mg | DELAYED_RELEASE_TABLET | Freq: Every day | ORAL | Status: DC
  Administered 2018-05-23 – 2018-06-22 (×29): 40 mg via ORAL
  Filled 2018-05-22 (×29): qty 1

## 2018-05-22 MED ORDER — SENNA 8.6 MG PO TABS
1.0000 | ORAL_TABLET | Freq: Every day | ORAL | Status: DC
  Administered 2018-05-23 – 2018-06-22 (×25): 8.6 mg via ORAL
  Filled 2018-05-22 (×31): qty 1

## 2018-05-22 MED ORDER — FERROUS SULFATE 325 (65 FE) MG PO TABS: 325.0000 mg | ORAL_TABLET | Freq: Two times a day (BID) | ORAL | Status: DC

## 2018-05-22 MED ORDER — GABAPENTIN 300 MG PO CAPS: 300.0000 mg | ORAL_CAPSULE | Freq: Two times a day (BID) | ORAL | Status: DC

## 2018-05-22 MED ORDER — FERROUS SULFATE 325 (65 FE) MG PO TABS: 325.0000 mg | ORAL_TABLET | Freq: Two times a day (BID) | ORAL | 3 refills | Status: DC

## 2018-05-22 MED ORDER — PANTOPRAZOLE SODIUM 40 MG PO TBEC: 40.0000 mg | DELAYED_RELEASE_TABLET | Freq: Every day | ORAL | Status: DC

## 2018-05-22 MED ORDER — INSULIN ASPART 100 UNIT/ML ~~LOC~~ SOLN: 4.0000 [IU] | Freq: Three times a day (TID) | SUBCUTANEOUS | Status: DC

## 2018-05-22 MED ORDER — INSULIN ASPART 100 UNIT/ML ~~LOC~~ SOLN
0.0000 [IU] | Freq: Three times a day (TID) | SUBCUTANEOUS | Status: DC
  Administered 2018-05-22: 2 [IU] via SUBCUTANEOUS
  Administered 2018-05-23 – 2018-05-24 (×2): 1 [IU] via SUBCUTANEOUS
  Administered 2018-05-24: 3 [IU] via SUBCUTANEOUS
  Administered 2018-05-25: 2 [IU] via SUBCUTANEOUS
  Administered 2018-05-25: 5 [IU] via SUBCUTANEOUS
  Administered 2018-05-25: 2 [IU] via SUBCUTANEOUS
  Administered 2018-05-26: 1 [IU] via SUBCUTANEOUS
  Administered 2018-05-26 – 2018-05-27 (×3): 2 [IU] via SUBCUTANEOUS
  Administered 2018-05-30 – 2018-05-31 (×2): 1 [IU] via SUBCUTANEOUS
  Administered 2018-05-31: 2 [IU] via SUBCUTANEOUS
  Administered 2018-06-01 – 2018-06-04 (×3): 1 [IU] via SUBCUTANEOUS
  Administered 2018-06-04: 2 [IU] via SUBCUTANEOUS
  Administered 2018-06-05: 1 [IU] via SUBCUTANEOUS
  Administered 2018-06-05: 2 [IU] via SUBCUTANEOUS
  Administered 2018-06-06 (×3): 1 [IU] via SUBCUTANEOUS
  Administered 2018-06-07 (×2): 2 [IU] via SUBCUTANEOUS
  Administered 2018-06-08: 3 [IU] via SUBCUTANEOUS
  Administered 2018-06-08: 5 [IU] via SUBCUTANEOUS
  Administered 2018-06-08 – 2018-06-09 (×2): 3 [IU] via SUBCUTANEOUS
  Administered 2018-06-09: 7 [IU] via SUBCUTANEOUS
  Administered 2018-06-09: 5 [IU] via SUBCUTANEOUS
  Administered 2018-06-10 (×2): 7 [IU] via SUBCUTANEOUS
  Administered 2018-06-10 – 2018-06-11 (×2): 5 [IU] via SUBCUTANEOUS
  Administered 2018-06-11: 7 [IU] via SUBCUTANEOUS
  Administered 2018-06-11 – 2018-06-13 (×5): 5 [IU] via SUBCUTANEOUS
  Administered 2018-06-13: 3 [IU] via SUBCUTANEOUS
  Administered 2018-06-13 – 2018-06-14 (×2): 7 [IU] via SUBCUTANEOUS
  Administered 2018-06-14 (×2): 3 [IU] via SUBCUTANEOUS
  Administered 2018-06-15: 2 [IU] via SUBCUTANEOUS
  Administered 2018-06-15: 3 [IU] via SUBCUTANEOUS
  Administered 2018-06-15 – 2018-06-16 (×2): 7 [IU] via SUBCUTANEOUS
  Administered 2018-06-16 – 2018-06-17 (×4): 2 [IU] via SUBCUTANEOUS
  Administered 2018-06-17: 3 [IU] via SUBCUTANEOUS
  Administered 2018-06-18: 1 [IU] via SUBCUTANEOUS
  Administered 2018-06-19: 2 [IU] via SUBCUTANEOUS
  Administered 2018-06-20 (×2): 1 [IU] via SUBCUTANEOUS
  Administered 2018-06-21: 2 [IU] via SUBCUTANEOUS
  Administered 2018-06-21 – 2018-06-22 (×2): 1 [IU] via SUBCUTANEOUS

## 2018-05-22 MED ORDER — OXYCODONE HCL 5 MG PO TABS
5.0000 mg | ORAL_TABLET | Freq: Four times a day (QID) | ORAL | Status: DC | PRN
Start: 2018-05-22 — End: 2018-06-22
  Administered 2018-05-22 – 2018-05-23 (×2): 10 mg via ORAL
  Administered 2018-05-23: 5 mg via ORAL
  Administered 2018-05-23 – 2018-06-17 (×14): 10 mg via ORAL
  Administered 2018-06-18 – 2018-06-22 (×4): 5 mg via ORAL
  Filled 2018-05-22 (×3): qty 2
  Filled 2018-05-22: qty 1
  Filled 2018-05-22 (×3): qty 2
  Filled 2018-05-22: qty 1
  Filled 2018-05-22 (×9): qty 2
  Filled 2018-05-22 (×3): qty 1
  Filled 2018-05-22 (×5): qty 2

## 2018-05-22 MED ORDER — SODIUM CHLORIDE 0.9 % IV SOLN: 8.0000 mg/kg | INTRAVENOUS | 0 refills | Status: DC

## 2018-05-22 MED ORDER — GABAPENTIN 300 MG PO CAPS
300.0000 mg | ORAL_CAPSULE | Freq: Two times a day (BID) | ORAL | Status: DC
  Administered 2018-05-22 – 2018-05-23 (×2): 300 mg via ORAL
  Filled 2018-05-22 (×2): qty 1

## 2018-05-22 MED ORDER — FOLIC ACID 1 MG PO TABS
1.0000 mg | ORAL_TABLET | Freq: Every day | ORAL | Status: DC
  Administered 2018-05-23 – 2018-06-22 (×30): 1 mg via ORAL
  Filled 2018-05-22 (×31): qty 1

## 2018-05-22 MED ORDER — INSULIN GLARGINE 100 UNIT/ML ~~LOC~~ SOLN: 20.0000 [IU] | Freq: Every day | SUBCUTANEOUS | 0 refills | Status: DC

## 2018-05-22 MED ORDER — SODIUM CHLORIDE 0.9 % IV SOLN
2.0000 g | INTRAVENOUS | Status: DC
  Administered 2018-05-23 – 2018-06-07 (×15): 2 g via INTRAVENOUS
  Filled 2018-05-22 (×17): qty 20

## 2018-05-22 MED ORDER — PROCHLORPERAZINE MALEATE 5 MG PO TABS: 5.0000 mg | ORAL_TABLET | Freq: Four times a day (QID) | ORAL | Status: DC | PRN

## 2018-05-22 MED ORDER — BISACODYL 10 MG RE SUPP: 10.0000 mg | Freq: Every day | RECTAL | Status: DC | PRN

## 2018-05-22 MED ORDER — DIPHENHYDRAMINE HCL 12.5 MG/5ML PO ELIX: 12.5000 mg | ORAL_SOLUTION | Freq: Four times a day (QID) | ORAL | Status: DC | PRN

## 2018-05-22 MED ORDER — ALUM & MAG HYDROXIDE-SIMETH 200-200-20 MG/5ML PO SUSP
30.0000 mL | ORAL | Status: DC | PRN
  Administered 2018-05-31: 30 mL via ORAL
  Filled 2018-05-22: qty 30

## 2018-05-22 MED ORDER — INSULIN GLARGINE 100 UNIT/ML ~~LOC~~ SOLN
20.0000 [IU] | Freq: Every day | SUBCUTANEOUS | Status: DC
  Administered 2018-05-23 – 2018-06-22 (×30): 20 [IU] via SUBCUTANEOUS
  Filled 2018-05-22 (×34): qty 0.2

## 2018-05-22 MED ORDER — FUROSEMIDE 20 MG PO TABS
20.0000 mg | ORAL_TABLET | Freq: Every day | ORAL | Status: DC
  Administered 2018-05-23 – 2018-05-28 (×6): 20 mg via ORAL
  Filled 2018-05-22 (×6): qty 1

## 2018-05-22 MED ORDER — INSULIN ASPART 100 UNIT/ML ~~LOC~~ SOLN: 0.0000 [IU] | Freq: Three times a day (TID) | SUBCUTANEOUS | 0 refills | Status: DC

## 2018-05-22 MED ORDER — PROCHLORPERAZINE 25 MG RE SUPP: 12.5000 mg | Freq: Four times a day (QID) | RECTAL | Status: DC | PRN

## 2018-05-22 MED ORDER — ADULT MULTIVITAMIN W/MINERALS CH
ORAL_TABLET | Freq: Every day | ORAL | Status: DC
  Administered 2018-05-23 – 2018-06-22 (×30): 1 via ORAL
  Filled 2018-05-22 (×31): qty 1

## 2018-05-22 MED ORDER — LEVOTHYROXINE SODIUM 75 MCG PO TABS
75.0000 ug | ORAL_TABLET | Freq: Every day | ORAL | Status: DC
  Administered 2018-05-23 – 2018-06-22 (×29): 75 ug via ORAL
  Filled 2018-05-22 (×31): qty 1

## 2018-05-22 MED ORDER — PROCHLORPERAZINE EDISYLATE 10 MG/2ML IJ SOLN: 5.0000 mg | Freq: Four times a day (QID) | INTRAMUSCULAR | Status: DC | PRN

## 2018-05-22 MED ORDER — INSULIN ASPART 100 UNIT/ML ~~LOC~~ SOLN
0.0000 [IU] | Freq: Every day | SUBCUTANEOUS | Status: DC
  Administered 2018-05-24: 3 [IU] via SUBCUTANEOUS
  Administered 2018-06-08 – 2018-06-10 (×3): 4 [IU] via SUBCUTANEOUS
  Administered 2018-06-11: 5 [IU] via SUBCUTANEOUS
  Administered 2018-06-12: 3 [IU] via SUBCUTANEOUS
  Administered 2018-06-13: 4 [IU] via SUBCUTANEOUS
  Administered 2018-06-14 – 2018-06-17 (×4): 3 [IU] via SUBCUTANEOUS

## 2018-05-22 MED ORDER — FERROUS SULFATE 325 (65 FE) MG PO TABS
325.0000 mg | ORAL_TABLET | Freq: Two times a day (BID) | ORAL | Status: DC
  Administered 2018-05-22 – 2018-06-22 (×59): 325 mg via ORAL
  Filled 2018-05-22 (×60): qty 1

## 2018-05-22 MED ORDER — LORATADINE 10 MG PO TABS
10.0000 mg | ORAL_TABLET | Freq: Every day | ORAL | Status: DC
Start: 2018-05-22 — End: 2018-06-22
  Administered 2018-05-23 – 2018-06-22 (×30): 10 mg via ORAL
  Filled 2018-05-22 (×31): qty 1

## 2018-05-22 MED ORDER — TRAZODONE HCL 50 MG PO TABS
25.0000 mg | ORAL_TABLET | Freq: Every evening | ORAL | Status: DC | PRN
  Administered 2018-06-01 – 2018-06-16 (×3): 50 mg via ORAL
  Filled 2018-05-22 (×4): qty 1

## 2018-05-22 MED ORDER — POLYETHYLENE GLYCOL 3350 17 G PO PACK
17.0000 g | PACK | Freq: Two times a day (BID) | ORAL | Status: DC
  Filled 2018-05-22 (×18): qty 1

## 2018-05-22 MED ORDER — SIMVASTATIN 40 MG PO TABS
40.0000 mg | ORAL_TABLET | Freq: Every day | ORAL | Status: DC
  Administered 2018-05-22 – 2018-06-21 (×29): 40 mg via ORAL
  Filled 2018-05-22 (×4): qty 2
  Filled 2018-05-22 (×2): qty 1
  Filled 2018-05-22: qty 2
  Filled 2018-05-22 (×2): qty 1
  Filled 2018-05-22 (×2): qty 2
  Filled 2018-05-22: qty 1
  Filled 2018-05-22 (×2): qty 2
  Filled 2018-05-22 (×5): qty 1
  Filled 2018-05-22 (×2): qty 2
  Filled 2018-05-22: qty 1
  Filled 2018-05-22: qty 2
  Filled 2018-05-22: qty 1
  Filled 2018-05-22: qty 2
  Filled 2018-05-22 (×2): qty 1
  Filled 2018-05-22: qty 2
  Filled 2018-05-22 (×2): qty 1

## 2018-05-22 MED ORDER — ALLOPURINOL 100 MG PO TABS
200.0000 mg | ORAL_TABLET | Freq: Every day | ORAL | Status: DC
  Administered 2018-05-23 – 2018-06-22 (×30): 200 mg via ORAL
  Filled 2018-05-22 (×32): qty 2

## 2018-05-22 MED ORDER — TRAMADOL HCL 50 MG PO TABS
50.0000 mg | ORAL_TABLET | Freq: Four times a day (QID) | ORAL | Status: DC | PRN
  Administered 2018-05-22 – 2018-06-19 (×21): 50 mg via ORAL
  Filled 2018-05-22 (×21): qty 1

## 2018-05-22 MED ORDER — INSULIN ASPART 100 UNIT/ML ~~LOC~~ SOLN: 4.0000 [IU] | Freq: Three times a day (TID) | SUBCUTANEOUS | 0 refills | Status: DC

## 2018-05-22 MED ORDER — AMLODIPINE BESYLATE 10 MG PO TABS
10.0000 mg | ORAL_TABLET | Freq: Every day | ORAL | Status: DC
  Administered 2018-05-23 – 2018-06-22 (×30): 10 mg via ORAL
  Filled 2018-05-22 (×31): qty 1

## 2018-05-22 MED ORDER — ACETAMINOPHEN 325 MG PO TABS
325.0000 mg | ORAL_TABLET | ORAL | Status: DC | PRN
  Administered 2018-05-23 – 2018-05-26 (×5): 650 mg via ORAL
  Filled 2018-05-22 (×5): qty 2

## 2018-05-22 NOTE — Progress Notes (Signed)
Inpatient Diabetes Program Recommendations  AACE/ADA: New Consensus Statement on Inpatient Glycemic Control (2019)  Target Ranges:  Prepandial:   less than 140 mg/dL      Peak postprandial:   less than 180 mg/dL (1-2 hours)      Critically ill patients:  140 - 180 mg/dL  Results for JESSICA, CHECKETTS (MRN [id number]) as of 05/22/2018 09:42  Ref. Range 05/21/2018 08:32 05/21/2018 12:06 05/21/2018 16:51 05/21/2018 20:16 05/22/2018 08:07  Glucose-Capillary Latest Ref Range: 65 - 99 mg/dL 190 (H) 214 (H) 231 (H) 225 (H) 142 (H)    Review of Glycemic Control  Current orders for Inpatient glycemic control: Lantus 20 units daily, Novolog 0-20 units TID with meals; Novolog 0-5 units QHS, Novolog 4 units TID with meals; Decadron 1 mg Q12H  Inpatient Diabetes Program Recommendations: Insulin - Meal Coverage: If Decadron is continued, please consider increasing meal coverage to Novolog 8 units TID with meals if patient east at least 50% of meals.  Thanks, Barnie Alderman, RN, MSN, CDE Diabetes Coordinator Inpatient Diabetes Program [phone] (Team Pager from 8am to 5pm)

## 2018-05-22 NOTE — H&P (Signed)
Physical Medicine and Rehabilitation Admission H&P    Chief Complaint  Patient presents with  . Functional deficits due to Lumbar diskitis with neuropathy    HPI: Erika Fry is a 72 year old female with history of HTN, CKD, anemia of chronic disease, T2DM, PPM 4/19, recent admission for fall due to UTI with back pain (no MRI due to recent PPM) treated with steroids and she was discharged to SNF on 05/05/18. She was readmitted on 05/12/18 with constipation, hypotension and acute on chronic anemia with Hgb 6.9.  She was treated with fluid boluses and started on PPI. MRI of spine done revealing acute osteomyelitis of L4/5 with paraspinous edema within the right psoas muscle, paraspinous fluid collection as well as moderate to severe right and moderate left foraminal stenosis.  GI was consulted for input and recommended supportive care to the medical work-up is complete.  She underwent aspiration of fluid collection from her back and cultures are negative.  ID was consulted for input and recommended at least 6 weeks of IV ceftriaxone and renally dosed daptomycin--end date July 24th.   She has been transfused with 6 units packed red blood cells during her stay.  She was treated with decadron for pain control and this was d/c today. Renal status has improved off furosemide. She has had episodes of heme positive stools and underwent EGD to revealing single friable gastric polyp pylorus polyp that was biopsied by Dr. Fuller Plan.  She is to follow-up on outpatient basis for colonoscopy and follow-up EGD.  Therapy ongoing and patient continues to be limited by pain neuropathy bilateral lower extremity as well as fatigue.  CIR was requested due to functional decline   Review of Systems  Constitutional: Negative for chills and fever.  HENT: Negative for hearing loss and tinnitus.   Eyes: Negative for blurred vision and double vision.  Respiratory: Negative for cough and shortness of breath.     Cardiovascular: Positive for leg swelling (chronic--wears support stockings). Negative for chest pain and palpitations.  Gastrointestinal: Negative for heartburn and nausea.  Genitourinary: Positive for frequency. Negative for dysuria and urgency.  Musculoskeletal: Positive for back pain and myalgias.  Skin: Negative for itching and rash.  Neurological: Positive for sensory change and focal weakness. Negative for dizziness and headaches.  Psychiatric/Behavioral: Negative for depression. The patient is not nervous/anxious.       Past Medical History:  Diagnosis Date  . Arthritis    "knees, thumbs" (03/25/2018)  . CKD (chronic kidney disease), stage IV ([place])   . Diet-controlled diabetes mellitus ([place])   . GERD (gastroesophageal reflux disease)   . Gout    "on daily RX" (03/25/2018)  . Heart murmur   . High cholesterol   . Hypertension   . Hypothyroidism   . Iron deficiency anemia    "had to get an iron infusion"  . Migraine    "used to have them growing up" (03/25/2018)  . Presence of permanent cardiac pacemaker 03/25/2018    Past Surgical History:  Procedure Laterality Date  . APPENDECTOMY    . BIOPSY  05/19/2018   Procedure: BIOPSY;  Surgeon: Ladene Artist, MD;  Location: [facility] ENDOSCOPY;  Service: Endoscopy;;  . ESOPHAGOGASTRODUODENOSCOPY N/A 05/19/2018   Procedure: ESOPHAGOGASTRODUODENOSCOPY (EGD);  Surgeon: Ladene Artist, MD;  Location: [facility] ENDOSCOPY;  Service: Endoscopy;  Laterality: N/A;  . INSERT / REPLACE / REMOVE PACEMAKER  03/25/2018  . IR FLUORO GUIDE CV LINE RIGHT  05/18/2018  . IR LUMBAR DISC  ASPIRATION W/IMG GUIDE  05/15/2018  . IR US GUIDE VASC ACCESS RIGHT  05/18/2018  . KNEE ARTHROSCOPY Bilateral   . PACEMAKER IMPLANT N/A 03/25/2018   Procedure: PACEMAKER IMPLANT;  Surgeon: Evans Lance, MD;  Location: [place] CV LAB;  Service: Cardiovascular;  Laterality: N/A;  . PACEMAKER PLACEMENT Left 03/2018  . RIGHT HEART CATH N/A 03/20/2018   Procedure: RIGHT  HEART CATH;  Surgeon: Nigel Mormon, MD;  Location: [place] CV LAB;  Service: Cardiovascular;  Laterality: N/A;  . TEE WITHOUT CARDIOVERSION N/A 03/20/2018   Procedure: TRANSESOPHAGEAL ECHOCARDIOGRAM (TEE);  Surgeon: Nigel Mormon, MD;  Location: [facility] ENDOSCOPY;  Service: Cardiovascular;  Laterality: N/A;  . TONSILLECTOMY      Family History  Problem Relation Age of Onset  . Hypertension Mother   . Diabetes Mellitus II Father   . Heart disease Father   . Gastric cancer Brother   . Diabetes Mellitus II Brother     Social History:  Lives alone. Independent prior to fall.  She  reports that she has never smoked. She has never used smokeless tobacco. She reports that she drank alcohol. She reports that she does not use drugs.    Allergies  Allergen Reactions  . Codeine Other (See Comments)    Increases Pain and couldn't sleep    Medications Prior to Admission  Medication Sig Dispense Refill  . acetaminophen (TYLENOL) 325 MG tablet Take 2 tablets (650 mg total) by mouth every 6 (six) hours as needed for mild pain.    [name] Kitchen allopurinol (ZYLOPRIM) 100 MG tablet Take 2 tablets (200 mg total) by mouth daily.    [name] Kitchen amLODipine (NORVASC) 10 MG tablet Take 10 mg by mouth daily.    [name] Kitchen aspirin EC 81 MG tablet Take 81 mg by mouth daily.    . Biotin (BIOTIN FORTE) 3 MG TABS Take 3 mg by mouth daily.    . cetirizine (ZYRTEC) 10 MG tablet Take 10 mg by mouth at bedtime.     . fluticasone (FLONASE) 50 MCG/ACT nasal spray Place 2 sprays into both nostrils daily as needed for allergies or rhinitis.    . folic acid (FOLVITE) 1 MG tablet Take 1 mg by mouth daily.    . furosemide (LASIX) 80 MG tablet Take 80 mg by mouth 2 (two) times daily.     . hydrALAZINE (APRESOLINE) 50 MG tablet Take 50 mg by mouth 3 (three) times daily.    . insulin aspart (NOVOLOG) 100 UNIT/ML injection Inject 0-15 Units into the skin 3 (three) times daily with meals. CBG < 70: implement hypoglycemia protocol CBG 70 -  120: 0 units CBG 121 - 150: 2 units CBG 151 - 200: 3 units CBG 201 - 250: 5 units CBG 251 - 300: 8 units CBG 301 - 350: 11 units CBG 351 - 400: 15 units CBG > 400: call MD.    . insulin aspart (NOVOLOG) 100 UNIT/ML injection Inject 0-5 Units into the skin at bedtime. Correction coverage: HS scale CBG < 70: implement hypoglycemia protocol CBG 70 - 120: 0 units CBG 121 - 150: 0 units CBG 151 - 200: 0 units CBG 201 - 250: 2 units CBG 251 - 300: 3 units CBG 301 - 350: 4 units CBG 351 - 400: 5 units CBG > 400: call MD.    . insulin glargine (LANTUS) 100 UNIT/ML injection Inject 0.05 mLs (5 Units total) into the skin daily. (Patient taking differently: Inject 5 Units into the skin at  bedtime. )    . levothyroxine (SYNTHROID, LEVOTHROID) 75 MCG tablet Take 75 mcg by mouth daily before breakfast.    . [EXPIRED] methocarbamol (ROBAXIN) 500 MG tablet Take 500 mg by mouth every 8 (eight) hours.    . Multiple Vitamin (MULTI-VITAMIN PO) Take 1 tablet by mouth daily.    . polyethylene glycol (MIRALAX / GLYCOLAX) packet Take 17 g by mouth 2 (two) times daily.    . ranitidine (ZANTAC) 150 MG tablet Take 150 mg by mouth daily.     [name] Kitchen senna (SENOKOT) 8.6 MG TABS tablet Take 1 tablet (8.6 mg total) by mouth daily.    . simvastatin (ZOCOR) 40 MG tablet Take 40 mg by mouth at bedtime.     . vitamin B-12 (CYANOCOBALAMIN) 1000 MCG tablet Take 1,000 mcg by mouth daily.    . [EXPIRED] oxyCODONE-acetaminophen (PERCOCET) 7.5-325 MG tablet Take 1 tablet by mouth every 6 (six) hours.      Drug Regimen Review  Drug regimen was reviewed and remains appropriate with no significant issues identified  Home: Home Living Family/patient expects to be discharged to:: Private residence Living Arrangements: Alone Available Help at Discharge: Family, Available PRN/intermittently Type of Home: House Home Access: Stairs to enter [employer] of Steps: 2 Entrance Stairs-Rails: None Home Layout: Two level,  Able to live on main level with bedroom/bathroom Bathroom Shower/Tub: Multimedia programmer: Handicapped height Home Equipment: None   Functional History: Prior Function Level of Independence: Needs assistance Gait / Transfers Assistance Needed: Independent prior to admission at the end of May after fall. Since then pt at most has amb very short (15') distance with walker and 2 person assist.  Functional Status:  Mobility: Bed Mobility Overal bed mobility: Needs Assistance Bed Mobility: Supine to Sit Rolling: Mod assist Sidelying to sit: Max assist, +2 for safety/equipment Supine to sit: Max assist, +2 for physical assistance Sit to sidelying: Max assist, +2 for physical assistance General bed mobility comments: patient with max cueing to progress BLE's to edge of bed and then max assistance to elevate trunk. however, patient then had decreased control back onto bed and required maxA + 2 to elevate again and then use of bed pad to move hips to edge of bed  Transfers Overall transfer level: Needs assistance Equipment used: 2 person hand held assist Transfer via Lift Equipment: Stedy Transfers: Sit to/from Stand, Set designer Transfers Sit to Stand: +2 physical assistance, From elevated surface, Total assist Squat pivot transfers: +2 physical assistance, Max assist General transfer comment: Initially trialed RW on patient request but patient unable to achieve hip clearance. Then trialed Stedy but unfortunately due to patient lack of push off was unable to pull up significantly. Therefore, required max assistance + 2 for low pivot from bed to chair.      ADL: ADL Overall ADL's : Needs assistance/impaired Eating/Feeding: Set up Grooming: Set up, Sitting Upper Body Bathing: Set up, Sitting Lower Body Bathing: Sitting/lateral leans, Maximal assistance Lower Body Bathing Details (indicate cue type and reason): pt able to reach knees Upper Body Dressing : Set up, Sitting Lower  Body Dressing: Maximal assistance, Sitting/lateral leans Toilet Transfer Details (indicate cue type and reason): will need +2 assist Toileting- Clothing Manipulation and Hygiene: Maximal assistance, Total assistance General ADL Comments: pt continues to have limitations due to pain and weakness, Poor endurance  Cognition: Cognition Overall Cognitive Status: Within Functional Limits for tasks assessed Orientation Level: Oriented X4 Cognition Arousal/Alertness: Awake/alert Behavior During Therapy: [facility] for tasks assessed/performed  Overall Cognitive Status: Within Functional Limits for tasks assessed General Comments: pt verbalized understanding of importance of HEP and therapy    Blood pressure (!) 160/77, pulse 68, temperature 98.1 F (36.7 C), temperature source Oral, resp. rate 18, height _0  (1.651 m), weight 102.1 kg (225 lb 1.4 oz), SpO2 99 %. Physical Exam  Constitutional: She is oriented to person, place, and time.  Obese  HENT:  Head: Normocephalic and atraumatic.  Eyes: Pupils are equal, round, and reactive to light.  Neck: Normal range of motion.  Cardiovascular: Normal rate and regular rhythm. Exam reveals no friction rub.  No murmur heard. Respiratory: Effort normal and breath sounds normal. No respiratory distress.  GI: Soft. She exhibits no distension.  Musculoskeletal:  2+ pitting edema bilateral hands and 1+ pedally. Low back TTP and painful with HF, bed mobility  Neurological: She is alert and oriented to person, place, and time. No cranial nerve deficit.  4/5 UE motor. LE: 2/5 HF, KE due to pain inhibition. ADF/PF 4/5. No sensory deficits.   Psychiatric: She has a normal mood and affect. Her behavior is normal. Thought content normal.    Results for orders placed or performed during the hospital encounter of 05/12/18 (from the past 48 hour(s))  Glucose, capillary     Status: Abnormal   Collection Time: 05/20/18 12:15 PM  Result Value Ref Range    Glucose-Capillary 267 (H) 65 - 99 mg/dL  Glucose, capillary     Status: Abnormal   Collection Time: 05/20/18  4:42 PM  Result Value Ref Range   Glucose-Capillary 307 (H) 65 - 99 mg/dL  Glucose, capillary     Status: Abnormal   Collection Time: 05/20/18  9:13 PM  Result Value Ref Range   Glucose-Capillary 331 (H) 65 - 99 mg/dL  Glucose, capillary     Status: Abnormal   Collection Time: 05/21/18  8:32 AM  Result Value Ref Range   Glucose-Capillary 190 (H) 65 - 99 mg/dL  Glucose, capillary     Status: Abnormal   Collection Time: 05/21/18 12:06 PM  Result Value Ref Range   Glucose-Capillary 214 (H) 65 - 99 mg/dL  Glucose, capillary     Status: Abnormal   Collection Time: 05/21/18  4:51 PM  Result Value Ref Range   Glucose-Capillary 231 (H) 65 - 99 mg/dL  Glucose, capillary     Status: Abnormal   Collection Time: 05/21/18  8:16 PM  Result Value Ref Range   Glucose-Capillary 225 (H) 65 - 99 mg/dL  Basic metabolic panel     Status: Abnormal   Collection Time: 05/22/18  3:44 AM  Result Value Ref Range   Sodium 131 (L) 135 - 145 mmol/L   Potassium 4.8 3.5 - 5.1 mmol/L   Chloride 106 101 - 111 mmol/L   CO2 20 (L) 22 - 32 mmol/L   Glucose, Bld 178 (H) 65 - 99 mg/dL   BUN 55 (H) 6 - 20 mg/dL   Creatinine, Ser 1.69 (H) 0.44 - 1.00 mg/dL   Calcium 8.5 (L) 8.9 - 10.3 mg/dL   GFR calc non Af Amer 29 (L) >60 mL/min   GFR calc Af Amer 33 (L) >60 mL/min    Comment: (NOTE) The eGFR has been calculated using the CKD EPI equation. This calculation has not been validated in all clinical situations. eGFR's persistently <60 mL/min signify possible Chronic Kidney Disease.    Anion gap 5 5 - 15    Comment: Performed at [facility],  [address], [place], [place] [address]  CK     Status: None   Collection Time: 05/22/18  3:44 AM  Result Value Ref Range   Total CK 139 38 - 234 U/L    Comment: Performed at [facility], [place] [address] [place], [place] [address]  CBC with  Differential/Platelet     Status: Abnormal   Collection Time: 05/22/18  3:44 AM  Result Value Ref Range   WBC 9.4 4.0 - 10.5 K/uL   RBC 2.97 (L) 3.87 - 5.11 MIL/uL   Hemoglobin 8.7 (L) 12.0 - 15.0 g/dL   HCT 26.6 (L) 36.0 - 46.0 %   MCV 89.6 78.0 - 100.0 fL   MCH 29.3 26.0 - 34.0 pg   MCHC 32.7 30.0 - 36.0 g/dL   RDW 16.3 (H) 11.5 - 15.5 %   Platelets 143 (L) 150 - 400 K/uL   Neutrophils Relative % 85 %   Neutro Abs 8.1 (H) 1.7 - 7.7 K/uL   Lymphocytes Relative 5 %   Lymphs Abs 0.4 (L) 0.7 - 4.0 K/uL   Monocytes Relative 7 %   Monocytes Absolute 0.6 0.1 - 1.0 K/uL   Eosinophils Relative 2 %   Eosinophils Absolute 0.2 0.0 - 0.7 K/uL   Basophils Relative 0 %   Basophils Absolute 0.0 0.0 - 0.1 K/uL   Immature Granulocytes 1 %   Abs Immature Granulocytes 0.1 0.0 - 0.1 K/uL    Comment: Performed at [facility], [place] [address], [place], [place] [address]  Glucose, capillary     Status: Abnormal   Collection Time: 05/22/18  8:07 AM  Result Value Ref Range   Glucose-Capillary 142 (H) 65 - 99 mg/dL   No results found.     Medical Problem List and Plan: 1.  Functional deficits secondary to L4-5 osteomyelitis, lumbar spondylosis  -admit to inpatient rehab 2.  DVT Prophylaxis/Anticoagulation: Mechanical: Sequential compression devices, below knee Bilateral lower extremities 3. Pain Management: Will schedule oxycodone prior to therapy to help with activity tolerance. Add ultram prn for moderate pain.  Monitor and titrate medications to help with neuropathic symptoms. Decadron d/c today.  4. Mood: LCSW to follow for evaluation and support.  5. Neuropsych: This patient is capable of making decisions on her own behalf. 6. Skin/Wound Care: routine pressure relief measures 7. Fluids/Electrolytes/Nutrition: Monitor I/O. Check lytes in am.  8. Lumbar diskitis: Afebrile on scheduled tylenol. On Daptomycin and ceftriaxone.   9. Acute on chronic anemia: Treated with iron and procrit on  outpatient basis.  Baseline Hgb- 9-10 range?  Will continue to monitor serially 10. Symptomatic Bradycardia s/p PPM: Monitor HR bid.  11. GIB with thrombocytopenia: Continue to monitor H/H serially for stability. Platelets trending down--monitor for signs of rebleeding.   12. New diagnosis T2DM: Hgb A1c- 6.3.  Poorly controlled due to recent rounds of steroids--Lantus was titrated upwards for better control. Was not on any medications at home.  Monitor BS ac/hs--watch for hypoglycemic episodes as steroids tapered off.   13. Neuropathy BLE: Continue Gabapentin bid--monitor for SE. 14. Hyponatremia: Question due to intake.  Will add protein supplements with meals. monitor for now.   15. Stage IV CKD: Baseline SCr 3.5-4.0. Monitor for SE/asterixis with narcotics on board. Renal status improving.  16. Fluid overload:  Monitor daily weights and for signs of overload--appears to be mildly  overloaded. Resume lasix at lower dose--was on 80 mg bid PTA.   Post Admission Physician Evaluation: 1. Functional  deficits secondary  to lumbar osteomyelitis. 2. Patient is admitted to receive collaborative, interdisciplinary care between the physiatrist, rehab nursing staff, and therapy team. 3. Patient's level of medical complexity and substantial therapy needs in context of that medical necessity cannot be provided at a lesser intensity of care such as a SNF. 4. Patient has experienced substantial functional loss from his/her baseline which was documented above under the "Functional History" and "Functional Status" headings.  Judging by the patient's diagnosis, physical exam, and functional history, the patient has potential for functional progress which will result in measurable gains while on inpatient rehab.  These gains will be of substantial and practical use upon discharge  in facilitating mobility and self-care at the household level. 5. Physiatrist will provide 24 hour management of medical needs as well as  oversight of the therapy plan/treatment and provide guidance as appropriate regarding the interaction of the two. 6. The Preadmission Screening has been reviewed and patient status is unchanged unless otherwise stated above. 7. 24 hour rehab nursing will assist with bladder management, bowel management, safety, skin/wound care, disease management, medication administration, pain management and patient education  and help integrate therapy concepts, techniques,education, etc. 8. PT will assess and treat for/with: Lower extremity strength, range of motion, stamina, balance, functional mobility, safety, adaptive techniques and equipment, pain mgt, orthotics, family education.   Goals are: supervision to min assist. 9. OT will assess and treat for/with: ADL's, functional mobility, safety, upper extremity strength, adaptive techniques and equipment, pain mgt, ego support, family education.   Goals are: supervision to min assist. Therapy may proceed with showering this patient. 10. SLP will assess and treat for/with: n/a.  Goals are: n/a. 11. Case Management and Social Worker will assess and treat for psychological issues and discharge planning. 12. Team conference will be held weekly to assess progress toward goals and to determine barriers to discharge. 13. Patient will receive at least 3 hours of therapy per day at least 5 days per week. 14. ELOS: 16-20 days       15. Prognosis:  excellent   I have personally performed a face to face diagnostic evaluation of this patient and formulated the key components of the plan.  Additionally, I have personally reviewed laboratory data, imaging studies, as well as relevant notes and concur with the physician assistant's documentation above.  Meredith Staggers, MD, Mellody Drown   Bary Leriche, PA-C 05/22/2018

## 2018-05-22 NOTE — Progress Notes (Signed)
Patient arrived to unit and oriented to unit procedures. No pain at this time. Resting comfortably with call bell in place.

## 2018-05-22 NOTE — Progress Notes (Signed)
PMR Admission Coordinator Pre-Admission Assessment  Patient: Erika Fry is an 72 y.o., female MRN: [id number] DOB: March 13, 1946 Height: 5\' 5"  (165.1 cm) Weight: 102.1 kg (225 lb 1.4 oz)                                                                                                                                                  Insurance Information HMO: Yes    PPO:      PCP:      IPA:      80/20:      OTHER:  PRIMARY: [employer] Medicare      Policy#: [id number]      Subscriber: Patient CM Name: Erika Fry     Phone#: [phone]     Fax#: [phone] Updates due : Update due to Erika Fry on day 7 of inpatient rehab (admit on 6/21 with update due 2/69/48) Pre-Cert#: [id number]      Employer:  Benefits:  Phone #: NA     Name: Online Portal: [email or web address] Eff. Date: 12/02/17     Deduct: $0      Out of Pocket Max: $6,700      Life Max: NA CIR: $430/day for days 1-4; $0/day for days 5+       SNF: $0/day for days 1-20, $160/day for days 21-62, $0/day for days 63-100; 100 day limit Outpatient: Per necessity   Co-Pay: $40/visit Home Health: Per necessity, 100% covered     DME: 80%     Co-Pay: 20% Providers:  Medicaid Application Date:       Case Manager:  Disability Application Date:       Case Worker:   Emergency Contact Information         Contact Information    Name Relation Home Work Mobile   Mcadam,Jennifer Relative   [name]      [place], [name]      [name], [name]        Current Medical History  Patient Admitting Diagnosis: Functional deficits secondary to L4-5 osteomyelitis and debility History of Present Illness: Erika Hattabaugh Tippettis a 72 y.o.femalewith history of HTN, CKD, T2DM, recent admission for falldue to UTIwith back pain and was discharged to SNF on 05/05/18. MRI not done due to recent PPM 5/7 and baseline CKD. She was readmitted on 05/12/18 with acute on chronic anemia, reports of constipation and hypotension.  She was treated with fluid bolus and PPI. MRI spine done revealing acute osteomyelitis of L4/5 with Paraspinous edema within right psoas muscle and Paraspinous fluid collection as well as moderate to severe right and moderate left foraminal stenosis. Gi consulted for input and recommended supportive care till medical work up complete. She underwent aspiration of fluid collection and cultures negative. ID consulted for input and recommended at least 6 weeks of IV Ceftriaxone and renally  dosed daptomycin.   She has been transfused with 6 units PRBC during her stay. She had had episodes of heme positive stools and underwent EGD revealing single friable gastric pylorus polyp that was biopsied by Dr. Fuller Plan. To follow up on outpatient basis for colonoscopy and f/u EGD. Therapy ongoing and patient limited by pain and fatigue. Family requesting CIR for follow up therapy. Pt to be admitted to CIR on 05/22/18.    Past Medical History      Past Medical History:  Diagnosis Date  . Arthritis    "knees, thumbs" (03/25/2018)  . CKD (chronic kidney disease), stage IV ([place])   . Diet-controlled diabetes mellitus ([place])   . GERD (gastroesophageal reflux disease)   . Gout    "on daily RX" (03/25/2018)  . Heart murmur   . High cholesterol   . Hypertension   . Hypothyroidism   . Iron deficiency anemia    "had to get an iron infusion"  . Migraine    "used to have them growing up" (03/25/2018)  . Presence of permanent cardiac pacemaker 03/25/2018    Family History  family history includes Diabetes Mellitus II in her brother and father; Gastric cancer in her brother; Heart disease in her father; Hypertension in her mother.  Prior Rehab/Hospitalizations:  Has the patient had major surgery during 100 days prior to admission? Yes  Current Medications   Current Facility-Administered Medications:  .  0.9 %  sodium chloride infusion, , Intravenous, PRN, Shelly Coss, MD, Stopped at  05/20/18 2305 .  acetaminophen (TYLENOL) tablet 650 mg, 650 mg, Oral, Q6H, Bennie Pierini, MD, 650 mg at 05/21/18 1020 .  allopurinol (ZYLOPRIM) tablet 200 mg, 200 mg, Oral, Daily, Bennie Pierini, MD, 200 mg at 05/21/18 0826 .  cefTRIAXone (ROCEPHIN) 2 g in sodium chloride 0.9 % 100 mL IVPB, 2 g, Intravenous, Q24H, Michel Bickers, MD, Last Rate: 200 mL/hr at 05/21/18 1318, 2 g at 05/21/18 1318 .  DAPTOmycin (CUBICIN) 780 mg in sodium chloride 0.9 % IVPB, 8 mg/kg, Intravenous, Q24H, Karren Cobble, RPH, Stopped at 05/20/18 2127 .  dexamethasone (DECADRON) tablet 1 mg, 1 mg, Oral, Q12H, Adhikari, Amrit, MD, 1 mg at 05/21/18 0826 .  fentaNYL (SUBLIMAZE) injection 12.5-25 mcg, 12.5-25 mcg, Intravenous, Q2H PRN, Cherene Altes, MD .  gabapentin (NEURONTIN) capsule 300 mg, 300 mg, Oral, BID, Tawanna Solo, Amrit, MD, 300 mg at 05/21/18 0826 .  insulin aspart (novoLOG) injection 0-20 Units, 0-20 Units, Subcutaneous, TID WC, Cherene Altes, MD, 7 Units at 05/21/18 1325 .  insulin aspart (novoLOG) injection 4 Units, 4 Units, Subcutaneous, TID WC, Shelly Coss, MD, 4 Units at 05/21/18 1317 .  insulin glargine (LANTUS) injection 20 Units, 20 Units, Subcutaneous, Daily, Shelly Coss, MD, 20 Units at 05/21/18 0826 .  levothyroxine (SYNTHROID, LEVOTHROID) tablet 75 mcg, 75 mcg, Oral, QAC breakfast, Bennie Pierini, MD, 75 mcg at 05/21/18 [phone] .  lidocaine (PF) (XYLOCAINE) 1 % injection, , , PRN, Hoss, Arthur, MD, 5 mL at 05/18/18 1548 .  methocarbamol (ROBAXIN) tablet 500 mg, 500 mg, Oral, Q8H PRN, Hammonds, Sharyn Blitz, MD, 500 mg at 05/18/18 0052 .  ondansetron (ZOFRAN) tablet 4 mg, 4 mg, Oral, Q6H PRN **OR** ondansetron (ZOFRAN) injection 4 mg, 4 mg, Intravenous, Q6H PRN, Schertz, Michele Mcalpine, MD .  oxyCODONE (Oxy IR/ROXICODONE) immediate release tablet 5-10 mg, 5-10 mg, Oral, Q6H PRN, Cherene Altes, MD, 10 mg at 05/21/18 1020 .  pantoprazole (PROTONIX) EC tablet 40 mg, 40 mg, Oral,  [id number], Vena Rua, PA-C, 40 mg at 05/21/18 7915 .  polyethylene glycol (MIRALAX / GLYCOLAX) packet 17 g, 17 g, Oral, BID, Armbruster, Carlota Raspberry, MD, 17 g at 05/15/18 2258 .  simvastatin (ZOCOR) tablet 40 mg, 40 mg, Oral, QHS, Bennie Pierini, MD, 40 mg at 05/20/18 2209 .  sodium chloride flush (NS) 0.9 % injection 10-40 mL, 10-40 mL, Intracatheter, Q12H, Cherene Altes, MD, 20 mL at 05/21/18 0837 .  sodium chloride flush (NS) 0.9 % injection 3 mL, 3 mL, Intravenous, Q12H, Schertz, Michele Mcalpine, MD, 3 mL at 05/19/18 1628  Patients Current Diet:       Diet Order           Diet regular Room service appropriate? Yes; Fluid consistency: Thin  Diet effective now          Precautions / Restrictions Precautions Precautions: Fall Precaution Comments: Pacemaker placement 4 weeks ago Restrictions Weight Bearing Restrictions: No   Has the patient had 2 or more falls or a fall with injury in the past year?Yes; previous fall led to back pain and SNF need.   Prior Activity Level Community (5-7x/wk): Yes  Home Assistive Devices / Equipment Home Assistive Devices/Equipment: None Home Equipment: None  Prior Device Use: Indicate devices/aids used by the patient prior to current illness, exacerbation or injury? No AD use prior to admission  Prior Functional Level Prior Function Level of Independence: Needs assistance Gait / Transfers Assistance Needed: Independent prior to admission at the end of May after fall. Since then pt at most has amb very short (15') distance with walker and 2 person assist.  Self Care: Did the patient need help bathing, dressing, using the toilet or eating?  Independent  Indoor Mobility: Did the patient need assistance with walking from room to room (with or without device)? Independent  Stairs: Did the patient need assistance with internal or external stairs (with or without device)? Independent  Functional Cognition: Did the patient need  help planning regular tasks such as shopping or remembering to take medications? Independent  Current Functional Level Cognition  Overall Cognitive Status: Within Functional Limits for tasks assessed Orientation Level: Oriented X4    Extremity Assessment (includes Sensation/Coordination)  Upper Extremity Assessment: Generalized weakness  Lower Extremity Assessment: Defer to PT evaluation RLE Deficits / Details: grossly 2+ to 3-/5 LLE Deficits / Details: grossly 2+ to 3-/5    ADLs  Overall ADL's : Needs assistance/impaired Grooming: Sitting, Wash/dry hands, Wash/dry face, Minimal assistance Upper Body Bathing: Moderate assistance, Sitting Lower Body Bathing: Total assistance Upper Body Dressing : Sitting, Moderate assistance Lower Body Dressing: Total assistance Toilet Transfer Details (indicate cue type and reason): will need +2 assist Toileting- Clothing Manipulation and Hygiene: Total assistance, Bed level General ADL Comments: pt continues to have limitations due to pain and weakness, Poor endurance    Mobility  Overal bed mobility: Needs Assistance Bed Mobility: Rolling, Sidelying to Sit, Sit to Sidelying Rolling: Mod assist Sidelying to sit: Max assist, +2 for safety/equipment Sit to sidelying: Max assist, +2 for physical assistance General bed mobility comments: Assist to move legs, elevate trunk into sitting and bring hips to EOB. Assist to lower trunk and bring legs back up into bed.    Transfers  Overall transfer level: Needs assistance Transfer via Lift Equipment: Stedy Transfers: Sit to/from Stand Sit to Stand: +2 physical assistance, From elevated surface, Max assist General transfer comment: 2 attemtpts to stand with use of Stedy standing frame, gait belt, and bed  pad to aid in hip extension; pt unable to achieve standing up enough for Stedy to be used for transfer; cues for sequencing and technique    Ambulation / Gait / Stairs / Teacher, adult education / Balance Dynamic Sitting Balance Sitting balance - Comments: maintains static sitting with close minguard for safety, Poor dynamic sitting. INcreased time and effort. Pt sat EOB x 15 minutes and required max A to sccot to [facility] before return to supine Balance Overall balance assessment: Needs assistance Sitting-balance support: Feet supported, Bilateral upper extremity supported Sitting balance-Leahy Scale: Poor Sitting balance - Comments: maintains static sitting with close minguard for safety, Poor dynamic sitting. INcreased time and effort. Pt sat EOB x 15 minutes and required max A to sccot to [facility] before return to supine Standing balance support: Bilateral upper extremity supported Standing balance-Leahy Scale: Zero    Special needs/care consideration BiPAP/CPAP: No CPM: No Continuous Drip IV: No; pt is on IV Rocephin and Cubicin Dialysis: No        Days: NA Life Vest: No  Oxygen: No Special Bed: No Trach Size: No Wound Vac (area): No      Location: NA Skin: BUE bruising                              Location Bowel mgmt: Last BM 05/20/18 Bladder mgmt: using [place] on acute side; Usually continent  Diabetic mgmt: Yes, per pt, was under control with diet when home; Pt reported she has been given insulin in hospital due to uncontrolled levels.      Previous Home Environment Living Arrangements: Alone Available Help at Discharge: Family, Available PRN/intermittently Type of Home: House Home Layout: Two level, Able to live on main level with bedroom/bathroom Home Access: Stairs to enter Entrance Stairs-Rails: None Entrance Stairs-Number of Steps: 2 Bathroom Shower/Tub: Multimedia programmer: Handicapped height [place]: No  Discharge Living Setting Plans for Discharge Living Setting: Patient's home, Alone, House, Other (Comment)(plan is to have Museum/gallery conservator + family members for 24/7 A ) Type of Home at Discharge: [place]: One  level Discharge Home Access: Stairs to enter Entrance Stairs-Rails: None Entrance Stairs-Number of Steps: 1 Discharge Bathroom Shower/Tub: Walk-in shower(has stool) Discharge Bathroom Toilet: Standard Discharge Bathroom Accessibility: Yes How Accessible: Accessible via walker Does the patient have any problems obtaining your medications?: No  Social/Family/Support Systems 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; retired; works in yard) [place]: Emergency contact is to be Anderson [place] [phone] Anticipated Caregiver: Museum/gallery conservator + other dtr-in-laws and 3 sons Anticipated Ambulance person Information: Museum/gallery conservator (Dtr-in-law) [phone] Ability/Limitations of Caregiver: Museum/gallery conservator lives 5 mins away but can be there 24/7 Caregiver Availability: 24/7(Amber plus family members to make up 24/7 A) Discharge Plan Discussed with Primary Caregiver: Yes Is Caregiver In Agreement with Plan?: Yes Does Caregiver/Family have Issues with Lodging/Transportation while Pt is in Rehab?: No   Goals/Additional Needs Patient/Family Goal for Rehab: PT/OT: Sup/Min A; SLP: NA Expected length of stay: 15-20 days Cultural Considerations: Catholic Dietary Needs: regular, thin liquids Equipment Needs: TBD Special Service Needs: NA Additional Information: NA Pt/Family Agrees to Admission and willing to participate: Yes(Amber is dtr-in-law who plans to be 24/7 A plus family) Program Orientation Provided & Reviewed with Pt/Caregiver Including Roles  & Responsibilities: Yes(reviewed with family) Additional Information Needs: NA Information Needs to be Provided By: NA  Barriers to Discharge: Home environment access/layout, Lack of/limited family support  Barriers to Discharge Comments: 1 step to enter   Decrease burden of Care through IP rehab admission: NA   Possible need for SNF placement upon discharge: Not anticipated.    Patient Condition: This patient's condition remains as documented in the  consult dated 05/20/18, in which the Rehabilitation Physician determined and documented that the patient's condition is appropriate for intensive rehabilitative care in an inpatient rehabilitation facility. Will admit to inpatient rehab today.  Preadmission Screen Completed By:  [name], 05/21/2018 1:49 PM ______________________________________________________________________   Discussed status with Dr. Naaman Plummer on 05/22/18 at 12:18PM and received telephone approval for admission today.  Admission Coordinator:  [name], time 12:19pm Sudie Grumbling 05/22/18             Cosigned by: Meredith Staggers, MD at 05/22/2018 1:34 PM  Revision History

## 2018-05-22 NOTE — Progress Notes (Signed)
ANTIBIOTIC CONSULT NOTE - INITIAL  Pharmacy Consult for Daptomycin Indication: Lumbar discitis/osteo    Allergies  Allergen Reactions  . Codeine Other (See Comments)    Increases Pain and couldn't sleep    Patient Measurements: Height: 5\' 5"  (165.1 cm) Weight: 225 lb 1.4 oz (102.1 kg) IBW/kg (Calculated) : 57 Adjusted Body Weight:   Vital Signs: Temp: 98.1 F (36.7 C) (06/21 0555) Temp Source: Oral (06/21 0555) BP: 160/77 (06/21 0555) Pulse Rate: 68 (06/21 0555) Intake/Output from previous day: 06/20 0701 - 06/21 0700 In: -  Out: 500 [Urine:500] Intake/Output from this shift: No intake/output data recorded.  Labs: Recent Labs    05/20/18 0439 05/22/18 0344  WBC 10.1 9.4  HGB 9.7* 8.7*  PLT 153 143*  CREATININE 1.90* 1.69*   Estimated Creatinine Clearance: 34.6 mL/min (A) (by C-G formula based on SCr of 1.69 mg/dL (H)). No results for input(s): VANCOTROUGH, VANCOPEAK, VANCORANDOM, GENTTROUGH, GENTPEAK, GENTRANDOM, TOBRATROUGH, TOBRAPEAK, TOBRARND, AMIKACINPEAK, AMIKACINTROU, AMIKACIN in the last 72 hours.   Microbiology:   Medical History: Past Medical History:  Diagnosis Date  . Arthritis    "knees, thumbs" (03/25/2018)  . CKD (chronic kidney disease), stage IV ([place])   . Diet-controlled diabetes mellitus ([place])   . GERD (gastroesophageal reflux disease)   . Gout    "on daily RX" (03/25/2018)  . Heart murmur   . High cholesterol   . Hypertension   . Hypothyroidism   . Iron deficiency anemia    "had to get an iron infusion"  . Migraine    "used to have them growing up" (03/25/2018)  . Presence of permanent cardiac pacemaker 03/25/2018   Assessment: ID: Osteomyelitis / lumbar discitis. Afebrile. WBC 8.6>8.0.  6/14: image-guided lumbar 4-lumbar 5 disc aspiration. PICC 6/17 For 6 weeks of therapy. Scr 1.69 and declining.  Zosyn 6/12>>6/16 CTX 6/16 >> (7/24) Vanco 6/12 >> 6/18 Dapto 6/18 >(7/24)  - 5/29: CK 81 -6/21: CK 139  6/14 Fungus Cx >  IP 6/14 Wound/Lumbar Aspirate Cx > ngtd, prelim 6/15 Blood Cx > ngtd  6/12 MRI: Findings concerning for acute osteomyelitis discitis involving the right aspect of the L4-5 interspace as above.   Plan:  -Continue Daptomycin 8mg /kg q 24h  -CTX 2g q24h per MD -CK weekly on Dapto (q Friday)    Makyle Eslick A. Levada Dy, PharmD, [place] Pager: [phone]   Khaza Blansett A Levada Dy 05/22/2018,8:55 AM

## 2018-05-22 NOTE — Progress Notes (Addendum)
Marcina Millard Fitting to be D/C'd Rehab [phone]) per MD order.  Allergies as of 05/22/2018      Reactions   Codeine Other (See Comments)   Increases Pain and couldn't sleep      Medication List    STOP taking these medications   aspirin EC 81 MG tablet   furosemide 80 MG tablet Commonly known as:  LASIX   oxyCODONE-acetaminophen 7.5-325 MG tablet Commonly known as:  PERCOCET Replaced by:  oxyCODONE 5 MG immediate release tablet   ranitidine 150 MG tablet Commonly known as:  ZANTAC     TAKE these medications   allopurinol 100 MG tablet Commonly known as:  ZYLOPRIM Take 2 tablets (200 mg total) by mouth daily.   amLODipine 10 MG tablet Commonly known as:  NORVASC Take 10 mg by mouth daily.   BIOTIN FORTE 3 MG Tabs Generic drug:  Biotin Take 3 mg by mouth daily.   cefTRIAXone 2 g in sodium chloride 0.9 % 100 mL Inject 2 g into the vein daily.   cetirizine 10 MG tablet Commonly known as:  ZYRTEC Take 10 mg by mouth at bedtime.   DAPTOmycin 780 mg in sodium chloride 0.9 % 100 mL Inject 780 mg into the vein daily.   ferrous sulfate 325 (65 FE) MG tablet Take 1 tablet (325 mg total) by mouth 2 (two) times daily with a meal.   fluticasone 50 MCG/ACT nasal spray Commonly known as:  FLONASE Place 2 sprays into both nostrils daily as needed for allergies or rhinitis.   folic acid 1 MG tablet Commonly known as:  FOLVITE Take 1 mg by mouth daily.   gabapentin 300 MG capsule Commonly known as:  NEURONTIN Take 1 capsule (300 mg total) by mouth 2 (two) times daily.   hydrALAZINE 50 MG tablet Commonly known as:  APRESOLINE Take 50 mg by mouth 3 (three) times daily.   insulin aspart 100 UNIT/ML injection Commonly known as:  novoLOG Inject 0-20 Units into the skin 3 (three) times daily with meals. What changed:    how much to take  additional instructions   insulin aspart 100 UNIT/ML injection Commonly known as:  novoLOG Inject 4 Units into the skin 3 (three) times  daily with meals. What changed:    how much to take  when to take this  additional instructions   insulin glargine 100 UNIT/ML injection Commonly known as:  LANTUS Inject 0.2 mLs (20 Units total) into the skin daily. Start taking on:  05/23/2018 What changed:  how much to take   levothyroxine 75 MCG tablet Commonly known as:  SYNTHROID, LEVOTHROID Take 75 mcg by mouth daily before breakfast.   methocarbamol 500 MG tablet Commonly known as:  ROBAXIN Take 1 tablet (500 mg total) by mouth every 8 (eight) hours as needed for muscle spasms. What changed:    when to take this  reasons to take this   MULTI-VITAMIN PO Take 1 tablet by mouth daily.   oxyCODONE 5 MG immediate release tablet Commonly known as:  Oxy IR/ROXICODONE Take 1-2 tablets (5-10 mg total) by mouth every 6 (six) hours as needed for severe pain. Replaces:  oxyCODONE-acetaminophen 7.5-325 MG tablet   pantoprazole 40 MG tablet Commonly known as:  PROTONIX Take 1 tablet (40 mg total) by mouth daily at 6 (six) AM. Start taking on:  05/23/2018   polyethylene glycol packet Commonly known as:  MIRALAX / GLYCOLAX Take 17 g by mouth 2 (two) times daily.   senna 8.6  MG Tabs tablet Commonly known as:  SENOKOT Take 1 tablet (8.6 mg total) by mouth daily.   simvastatin 40 MG tablet Commonly known as:  ZOCOR Take 40 mg by mouth at bedtime.   vitamin B-12 1000 MCG tablet Commonly known as:  CYANOCOBALAMIN Take 1,000 mcg by mouth daily.       Vitals:   05/22/18 0555 05/22/18 1515  BP: (!) 160/77 (!) 162/70  Pulse: 68 65  Resp: 18 18  Temp: 98.1 F (36.7 C) 98 F (36.7 C)  SpO2: 99% 100%    Skin clean, dry and intact without evidence of skin break down, no evidence of skin tears noted. Right chest PICC saline locked. No complaints noted. Report called to [facility], RN and pt moved to [phone].  Patient transferred to [place] rehab [phone] via bed  Chapman Fitch BSN, RN [facility] Phone [phone]

## 2018-05-22 NOTE — Progress Notes (Signed)
CIR accepting patient today. CSW signing off as no further needs.   Percell Locus Demya Scruggs LCSW [phone]

## 2018-05-22 NOTE — Progress Notes (Signed)
Inpatient Rehabilitation-Admissions Coordinator   [facility] received insurance approval and medical clearance for DC to CIR. AC has bed available and pt will be transferred to CIR today. Floor RN, CM, and SW notified of plan. Please call if questions.   [name], OTR/L  Rehab Admissions Coordinator  [phone] 05/22/2018 12:14 PM

## 2018-05-22 NOTE — H&P (Addendum)
Physical Medicine and Rehabilitation Admission H&P     Chief Complaint  Patient presents with  . Functional deficits due to Lumbar diskitis with neuropathy  HPI: Erika Fry is a 72 year old female with history of HTN, CKD, anemia of chronic disease, T2DM, PPM 4/19, recent admission for fall due to UTI with back pain (no MRI due to recent PPM) treated with steroids and she was discharged to SNF on 05/05/18. She was readmitted on 05/12/18 with constipation, hypotension and acute on chronic anemia with Hgb 6.9. She was treated with fluid boluses and started on PPI. MRI of spine done revealing acute osteomyelitis of L4/5 with paraspinous edema within the right psoas muscle, paraspinous fluid collection as well as moderate to severe right and moderate left foraminal stenosis. GI was consulted for input and recommended supportive care to the medical work-up is complete. She underwent aspiration of fluid collection from her back and cultures are negative. ID was consulted for input and recommended at least 6 weeks of IV ceftriaxone and renally dosed daptomycin--end date July 24th.  She has been transfused with 6 units packed red blood cells during her stay. She was treated with decadron for pain control and this was d/c today. Renal status has improved off furosemide. She has had episodes of heme positive stools and underwent EGD to revealing single friable gastric polyp pylorus polyp that was biopsied by Dr. Fuller Plan. She is to follow-up on outpatient basis for colonoscopy and follow-up EGD. Therapy ongoing and patient continues to be limited by pain neuropathy bilateral lower extremity as well as fatigue. CIR was requested due to functional decline  Review of Systems  Constitutional: Negative for chills and fever.  HENT: Negative for hearing loss and tinnitus.  Eyes: Negative for blurred vision and double vision.  Respiratory: Negative for cough and shortness of breath.  Cardiovascular: Positive for leg  swelling (chronic--wears support stockings). Negative for chest pain and palpitations.  Gastrointestinal: Negative for heartburn and nausea.  Genitourinary: Positive for frequency. Negative for dysuria and urgency.  Musculoskeletal: Positive for back pain and myalgias.  Skin: Negative for itching and rash.  Neurological: Positive for sensory change and focal weakness. Negative for dizziness and headaches.  Psychiatric/Behavioral: Negative for depression. The patient is not nervous/anxious.       Past Medical History:  Diagnosis Date  . Arthritis    "knees, thumbs" (03/25/2018)  . CKD (chronic kidney disease), stage IV ([place])   . Diet-controlled diabetes mellitus ([place])   . GERD (gastroesophageal reflux disease)   . Gout    "on daily RX" (03/25/2018)  . Heart murmur   . High cholesterol   . Hypertension   . Hypothyroidism   . Iron deficiency anemia    "had to get an iron infusion"  . Migraine    "used to have them growing up" (03/25/2018)  . Presence of permanent cardiac pacemaker 03/25/2018        Past Surgical History:  Procedure Laterality Date  . APPENDECTOMY    . BIOPSY  05/19/2018   Procedure: BIOPSY; Surgeon: Ladene Artist, MD; Location: [facility] ENDOSCOPY; Service: Endoscopy;;  . ESOPHAGOGASTRODUODENOSCOPY N/A 05/19/2018   Procedure: ESOPHAGOGASTRODUODENOSCOPY (EGD); Surgeon: Ladene Artist, MD; Location[facility] ENDOSCOPY; Service: Endoscopy; Laterality: N/A;  . INSERT / REPLACE / REMOVE PACEMAKER  03/25/2018  . IR FLUORO GUIDE CV LINE RIGHT  05/18/2018  . IR LUMBAR DISC ASPIRATION W/IMG GUIDE  05/15/2018  . IR US GUIDE VASC ACCESS RIGHT  05/18/2018  . KNEE ARTHROSCOPY  Bilateral   . PACEMAKER IMPLANT N/A 03/25/2018   Procedure: PACEMAKER IMPLANT; Surgeon: Evans Lance, MD; Location: [place] CV LAB; Service: Cardiovascular; Laterality: N/A;  . PACEMAKER PLACEMENT Left 03/2018  . RIGHT HEART CATH N/A 03/20/2018   Procedure: RIGHT HEART CATH; Surgeon: Nigel Mormon, MD;  Location: [place] CV LAB; Service: Cardiovascular; Laterality: N/A;  . TEE WITHOUT CARDIOVERSION N/A 03/20/2018   Procedure: TRANSESOPHAGEAL ECHOCARDIOGRAM (TEE); Surgeon: Nigel Mormon, MD; Location: [facility] ENDOSCOPY; Service: Cardiovascular; Laterality: N/A;  . TONSILLECTOMY          Family History  Problem Relation Age of Onset  . Hypertension Mother   . Diabetes Mellitus II Father   . Heart disease Father   . Gastric cancer Brother   . Diabetes Mellitus II Brother    Social History: Lives alone. Independent prior to fall. She reports that she has never smoked. She has never used smokeless tobacco. She reports that she drank alcohol. She reports that she does not use drugs.       Allergies  Allergen Reactions  . Codeine Other (See Comments)    Increases Pain and couldn't sleep         Medications Prior to Admission  Medication Sig Dispense Refill  . acetaminophen (TYLENOL) 325 MG tablet Take 2 tablets (650 mg total) by mouth every 6 (six) hours as needed for mild pain.    [name] Kitchen allopurinol (ZYLOPRIM) 100 MG tablet Take 2 tablets (200 mg total) by mouth daily.    [name] Kitchen amLODipine (NORVASC) 10 MG tablet Take 10 mg by mouth daily.    [name] Kitchen aspirin EC 81 MG tablet Take 81 mg by mouth daily.    . Biotin (BIOTIN FORTE) 3 MG TABS Take 3 mg by mouth daily.    . cetirizine (ZYRTEC) 10 MG tablet Take 10 mg by mouth at bedtime.     . fluticasone (FLONASE) 50 MCG/ACT nasal spray Place 2 sprays into both nostrils daily as needed for allergies or rhinitis.    . folic acid (FOLVITE) 1 MG tablet Take 1 mg by mouth daily.    . furosemide (LASIX) 80 MG tablet Take 80 mg by mouth 2 (two) times daily.     . hydrALAZINE (APRESOLINE) 50 MG tablet Take 50 mg by mouth 3 (three) times daily.    . insulin aspart (NOVOLOG) 100 UNIT/ML injection Inject 0-15 Units into the skin 3 (three) times daily with meals. CBG < 70: implement hypoglycemia protocol  CBG 70 - 120: 0 units  CBG 121 - 150: 2 units  CBG 151 -  200: 3 units  CBG 201 - 250: 5 units  CBG 251 - 300: 8 units  CBG 301 - 350: 11 units  CBG 351 - 400: 15 units  CBG > 400: call MD.    . insulin aspart (NOVOLOG) 100 UNIT/ML injection Inject 0-5 Units into the skin at bedtime. Correction coverage: HS scale  CBG < 70: implement hypoglycemia protocol  CBG 70 - 120: 0 units  CBG 121 - 150: 0 units  CBG 151 - 200: 0 units  CBG 201 - 250: 2 units  CBG 251 - 300: 3 units  CBG 301 - 350: 4 units  CBG 351 - 400: 5 units  CBG > 400: call MD.    . insulin glargine (LANTUS) 100 UNIT/ML injection Inject 0.05 mLs (5 Units total) into the skin daily. (Patient taking differently: Inject 5 Units into the skin at bedtime. )    .  levothyroxine (SYNTHROID, LEVOTHROID) 75 MCG tablet Take 75 mcg by mouth daily before breakfast.    . [EXPIRED] methocarbamol (ROBAXIN) 500 MG tablet Take 500 mg by mouth every 8 (eight) hours.    . Multiple Vitamin (MULTI-VITAMIN PO) Take 1 tablet by mouth daily.    . polyethylene glycol (MIRALAX / GLYCOLAX) packet Take 17 g by mouth 2 (two) times daily.    . ranitidine (ZANTAC) 150 MG tablet Take 150 mg by mouth daily.     [name] Kitchen senna (SENOKOT) 8.6 MG TABS tablet Take 1 tablet (8.6 mg total) by mouth daily.    . simvastatin (ZOCOR) 40 MG tablet Take 40 mg by mouth at bedtime.     . vitamin B-12 (CYANOCOBALAMIN) 1000 MCG tablet Take 1,000 mcg by mouth daily.    . [EXPIRED] oxyCODONE-acetaminophen (PERCOCET) 7.5-325 MG tablet Take 1 tablet by mouth every 6 (six) hours.     Drug Regimen Review  Drug regimen was reviewed and remains appropriate with no significant issues identified  Home:  Home Living  Family/patient expects to be discharged to:: Private residence  Living Arrangements: Alone  Available Help at Discharge: Family, Available PRN/intermittently  Type of Home: House  Home Access: Stairs to enter  [employer] of Steps: 2  Entrance Stairs-Rails: None  Home Layout: Two level, Able to live on main level with  bedroom/bathroom  Bathroom Shower/Tub: Tourist information centre manager: Handicapped height  Home Equipment: None  Functional History:  Prior Function  Level of Independence: Needs assistance  Gait / Transfers Assistance Needed: Independent prior to admission at the end of May after fall. Since then pt at most has amb very short (15') distance with walker and 2 person assist.  Functional Status:  Mobility:  Bed Mobility  Overal bed mobility: Needs Assistance  Bed Mobility: Supine to Sit  Rolling: Mod assist  Sidelying to sit: Max assist, +2 for safety/equipment  Supine to sit: Max assist, +2 for physical assistance  Sit to sidelying: Max assist, +2 for physical assistance  General bed mobility comments: patient with max cueing to progress BLE's to edge of bed and then max assistance to elevate trunk. however, patient then had decreased control back onto bed and required maxA + 2 to elevate again and then use of bed pad to move hips to edge of bed  Transfers  Overall transfer level: Needs assistance  Equipment used: 2 person hand held assist  Transfer via Lift Equipment: Stedy  Transfers: Sit to/from Stand, Set designer Transfers  Sit to Stand: +2 physical assistance, From elevated surface, Total assist  Squat pivot transfers: +2 physical assistance, Max assist  General transfer comment: Initially trialed RW on patient request but patient unable to achieve hip clearance. Then trialed Stedy but unfortunately due to patient lack of push off was unable to pull up significantly. Therefore, required max assistance + 2 for low pivot from bed to chair.    ADL:  ADL  Overall ADL's : Needs assistance/impaired  Eating/Feeding: Set up  Grooming: Set up, Sitting  Upper Body Bathing: Set up, Sitting  Lower Body Bathing: Sitting/lateral leans, Maximal assistance  Lower Body Bathing Details (indicate cue type and reason): pt able to reach knees  Upper Body Dressing : Set up, Sitting  Lower Body  Dressing: Maximal assistance, Sitting/lateral leans  Toilet Transfer Details (indicate cue type and reason): will need +2 assist  Toileting- Clothing Manipulation and Hygiene: Maximal assistance, Total assistance  General ADL Comments: pt continues to have limitations due  to pain and weakness, Poor endurance  Cognition:  Cognition  Overall Cognitive Status: Within Functional Limits for tasks assessed  Orientation Level: Oriented X4  Cognition  Arousal/Alertness: Awake/alert  Behavior During Therapy: WFL for tasks assessed/performed  Overall Cognitive Status: Within Functional Limits for tasks assessed  General Comments: pt verbalized understanding of importance of HEP and therapy  Blood pressure (!) 160/77, pulse 68, temperature 98.1 F (36.7 C), temperature source Oral, resp. rate 18, height 5\' 5"  (1.651 m), weight 102.1 kg (225 lb 1.4 oz), SpO2 99 %.  Physical Exam  Constitutional: She is oriented to person, place, and time.  Obese  HENT:  Head: Normocephalic and atraumatic.  Eyes: Pupils are equal, round, and reactive to light.  Neck: Normal range of motion.  Cardiovascular: Normal rate and regular rhythm. Exam reveals no friction rub.  No murmur heard.  Respiratory: Effort normal and breath sounds normal. No respiratory distress.  GI: Soft. She exhibits no distension.  Musculoskeletal:  2+ pitting edema bilateral hands and 1+ pedally. Low back TTP and painful with HF, bed mobility  Neurological: She is alert and oriented to person, place, and time. No cranial nerve deficit.  4/5 UE motor. LE: 2/5 HF, KE due to pain inhibition. ADF/PF 4/5. No sensory deficits.  Psychiatric: She has a normal mood and affect. Her behavior is normal. Thought content normal.   Lab Results Last 48 Hours  Imaging Results (Last 48 hours)     Medical Problem List and Plan:  1. Functional deficits secondary to L4-5 osteomyelitis/discitis, lumbar spondylosis  -admit to inpatient rehab  2. DVT Prophylaxis/Anticoagulation: Mechanical: Sequential compression devices, below knee Bilateral lower extremities  3. Pain Management: Will schedule oxycodone prior to therapy to help with activity tolerance. Add ultram prn for moderate pain. Monitor and titrate medications to help with neuropathic symptoms. Decadron d/c today.  4. Mood: LCSW to follow for evaluation and support.  5. Neuropsych: This patient is capable of making decisions on her own behalf.  6. Skin/Wound Care: routine pressure relief measures  7. Fluids/Electrolytes/Nutrition: Monitor I/O. Check lytes in am.  8. Lumbar diskitis: Afebrile on scheduled tylenol. On Daptomycin and ceftriaxone.  9. Acute on chronic anemia: Treated with iron and procrit on outpatient basis. Baseline Hgb- 9-10 range? Will continue to monitor serially  10. Symptomatic Bradycardia s/p PPM: Monitor HR bid.  11. GIB with thrombocytopenia: Continue to monitor H/H serially for stability. Platelets trending down--monitor for signs of rebleeding.  12. New diagnosis T2DM: Hgb A1c- 6.3. Poorly controlled due to recent rounds of steroids--Lantus was titrated upwards for better control. Was not on any medications at home. Monitor BS ac/hs--watch for hypoglycemic episodes as steroids tapered off.  13. Neuropathy BLE: Continue Gabapentin bid--monitor for SE.  14. Hyponatremia: Question due to intake. Will add protein supplements with meals. monitor for now.  15. Stage IV CKD: Baseline SCr 3.5-4.0.  Monitor for SE/asterixis with narcotics on board. Renal status improving.  16. Fluid overload: Monitor daily weights and for signs of overload--appears to be mildly overloaded. Resume lasix at lower dose--was on 80 mg bid PTA.   Post Admission Physician Evaluation:  1. Functional deficits secondary to lumbar osteomyelitis/discitis. 2. Patient is admitted to receive collaborative, interdisciplinary care between the physiatrist, rehab nursing staff, and therapy team. 3. Patient's level of medical complexity and substantial therapy needs in context of that medical necessity cannot be provided at a lesser intensity of care such as a SNF. 4. Patient has experienced substantial functional loss from his/her baseline which was documented above under the "Functional History" and "Functional Status" headings. Judging by the patient's diagnosis, physical exam, and functional history, the patient has potential for functional progress which will result in measurable gains while on inpatient rehab. These gains will be of substantial and practical use upon discharge in facilitating mobility and self-care at the household level. 5. Physiatrist will provide 24 hour management of medical needs as well as oversight of the therapy plan/treatment and provide guidance as appropriate regarding the interaction of the two. 6. The Preadmission Screening has been reviewed and patient status is unchanged unless otherwise stated above. 7. 24 hour rehab nursing will assist with bladder management, bowel management, safety, skin/wound care, disease management, medication administration, pain management and patient education and help integrate therapy concepts, techniques,education, etc. 8. PT will assess and treat for/with: Lower extremity strength, range of motion, stamina, balance, functional mobility, safety, adaptive techniques and equipment, pain mgt, orthotics, family education. Goals are: supervision to min assist. 9. OT will  assess and treat for/with: ADL's, functional mobility, safety, upper extremity strength, adaptive techniques and equipment, pain mgt, ego support, family education. Goals are: supervision to min assist. Therapy may proceed with showering this patient. 10. SLP will assess and treat for/with: n/a. Goals are: n/a. 11. Case Management and Social Worker will assess and treat for psychological issues and discharge planning. 12. Team conference will be held weekly to  assess progress toward goals and to determine barriers to discharge. 13. Patient will receive at least 3 hours of therapy per day at least 5 days per week. 14. ELOS: 16-20 days  15. Prognosis: excellent   I have personally performed a face to face diagnostic evaluation of this patient and formulated the key components of the plan. Additionally, I have personally reviewed laboratory data, imaging studies, as well as relevant notes and concur with the physician assistant's documentation above.  Meredith Staggers, MD, Mellody Drown  Bary Leriche, PA-C  05/22/2018

## 2018-05-22 NOTE — Discharge Summary (Signed)
Physician Discharge Summary  Erika Fry MWU:[id number] DOB: 18-Jan-1946 DOA: 05/12/2018  PCP: [name], MD  Admit date: 05/12/2018 Discharge date: 05/22/2018  Admitted From: SNF Disposition:  CIR  Discharge Condition:Stable CODE STATUS:FULL Diet recommendation: Heart Healthy   Brief/Interim Summary:  Patient is a 72yo F w/ a Hx of second-degree AV block S/P permanent pacemakerplacement on 04/07/2018, HTN, CKD stage IV, Hypothyroidism, DM2,  Iron Deficiency anemia. She was discharged to a SNF on 6/4after being admitted 5/29 following a mechanical fall which resulted in severe back pain and difficulty ambulating.She was started on a steroid Dosepak and discharged to SNF.  MRI was not obtained at that time  due to recent placement of permanent pacemaker and CT myelogram was also contraindicated due to patient's baseline CKD.  She continued to have back pain and also began to experience fatigue and weakness. In the ED ,she was found to have a hemoglobin of 4.1 (was 8.3 on 6/4), and a BUN of 158, w/ creatinine 3.62.  She was started on Protonix, given 2 units of PRBCs.  GI consulted.She is S/P EGD on 6/18which showed  one oozing gastric polyp .She needs to have  outpt colo and f/u EGD as an outpatient.  At the meantime, MRI lumbar spine showed lumbar discitis.  ID was consulted and started on antibiotics which will be continued for total of 6 weeks.The last day of antibiotics will be July 24.  She will follow-up with ID as well as an outpatient. Patient was recommended inpatient rehab by physical therapy and she has been discharged to CIR today.  Following problems were addressed during hospitalization:   Acute blood loss anemia /Anemia of chronic kidney disease: S/p transfusion of 6U PRBC so far on this admission .  Currently hemoglobin is stable .Today it 8.7  Please  continue to monitor.  Also started on iron supplementation.  Upper GI bleed: GI was following - EGD  6/18noted one oozing gastric polyp.She needs to  to outpt colonoscopy  and f/u EGD as an outpatient.  L4/L5 Acute discitis/osteomyelitis with Lumbar nerve root impingement: To complete 6 weeks of IV abx tx - lumbar aspirate cultures negative but received 48hrs of abx prior to procedure- ID recommending total 6 weeks of daptomycin and ceftriaxone which will be continued until July 24.  Patient already underwent  tunneled PICC line.  She will follow-up with infectious disease as an outpatient.  She was on Decadron which has been stopped today.  Continue pain management and physical therapy.  CKD stage IV: Baseline Cr 3.0-4.0.  Kidney function significantly better than baseline without lasix.  She was on 80 mg Lasix twice a day at home which is currently on hold.I think it very high dose.  Please consider restarting it at 40 mg BID or OD in 2-3 days  after evaluating her volume status after her creatinine pleateus.  Hyponatremia: Follow trend  2:1 heart block: Status post pacemaker placement  Hypothyroidism: Cont usual synthroid dose   DM 2: Uncontrolled blood glucose most likely secondary to steroids.  We will continue Lantus and sliding scale insulin at current dose.   Discharge Diagnoses:  Principal Problem:   Lumbar discitis Active Problems:   Mobitz type 2 second degree AV block   CKD (chronic kidney disease), stage IV (HCC)   Diet-controlled diabetes mellitus (HCC)   GERD (gastroesophageal reflux disease)   Gout   High cholesterol   Hypothyroidism   Essential hypertension   Blood loss anemia   Occult blood  in stools   Gastric polyp    Discharge Instructions  Discharge Instructions    Diet - low sodium heart healthy   Complete by:  As directed    Discharge instructions   Complete by:  As directed    1) Continue ceftriaxone 2 g daily and daptomycin 8 mg /kg daily till July 24. 2)Do CK and BMP test every 7 days while on this antibiotics.  Check your CBC in 7  days. 3) Follow up with infectious disease as an outpatient in 3 weeks.  Name and number of the provider has been attached.   Increase activity slowly   Complete by:  As directed      Allergies as of 05/22/2018      Reactions   Codeine Other (See Comments)   Increases Pain and couldn't sleep      Medication List    STOP taking these medications   aspirin EC 81 MG tablet   furosemide 80 MG tablet Commonly known as:  LASIX   oxyCODONE-acetaminophen 7.5-325 MG tablet Commonly known as:  PERCOCET Replaced by:  oxyCODONE 5 MG immediate release tablet   ranitidine 150 MG tablet Commonly known as:  ZANTAC     TAKE these medications   acetaminophen 325 MG tablet Commonly known as:  TYLENOL Take 2 tablets (650 mg total) by mouth every 6 (six) hours as needed for mild pain.   allopurinol 100 MG tablet Commonly known as:  ZYLOPRIM Take 2 tablets (200 mg total) by mouth daily.   amLODipine 10 MG tablet Commonly known as:  NORVASC Take 10 mg by mouth daily.   BIOTIN FORTE 3 MG Tabs Generic drug:  Biotin Take 3 mg by mouth daily.   cefTRIAXone 2 g in sodium chloride 0.9 % 100 mL Inject 2 g into the vein daily.   cetirizine 10 MG tablet Commonly known as:  ZYRTEC Take 10 mg by mouth at bedtime.   DAPTOmycin 780 mg in sodium chloride 0.9 % 100 mL Inject 780 mg into the vein daily.   fentaNYL 100 MCG/2ML injection Commonly known as:  SUBLIMAZE Inject 0.25-0.5 mLs (12.5-25 mcg total) into the vein every 2 (two) hours as needed (breakthrough pain).   ferrous sulfate 325 (65 FE) MG tablet Take 1 tablet (325 mg total) by mouth 2 (two) times daily with a meal.   fluticasone 50 MCG/ACT nasal spray Commonly known as:  FLONASE Place 2 sprays into both nostrils daily as needed for allergies or rhinitis.   folic acid 1 MG tablet Commonly known as:  FOLVITE Take 1 mg by mouth daily.   gabapentin 300 MG capsule Commonly known as:  NEURONTIN Take 1 capsule (300 mg total) by  mouth 2 (two) times daily.   hydrALAZINE 50 MG tablet Commonly known as:  APRESOLINE Take 50 mg by mouth 3 (three) times daily.   insulin aspart 100 UNIT/ML injection Commonly known as:  novoLOG Inject 0-20 Units into the skin 3 (three) times daily with meals. What changed:    how much to take  additional instructions   insulin aspart 100 UNIT/ML injection Commonly known as:  novoLOG Inject 4 Units into the skin 3 (three) times daily with meals. What changed:    how much to take  when to take this  additional instructions   insulin glargine 100 UNIT/ML injection Commonly known as:  LANTUS Inject 0.2 mLs (20 Units total) into the skin daily. Start taking on:  05/23/2018 What changed:  how much to take  levothyroxine 75 MCG tablet Commonly known as:  SYNTHROID, LEVOTHROID Take 75 mcg by mouth daily before breakfast.   methocarbamol 500 MG tablet Commonly known as:  ROBAXIN Take 1 tablet (500 mg total) by mouth every 8 (eight) hours as needed for muscle spasms. What changed:    when to take this  reasons to take this   MULTI-VITAMIN PO Take 1 tablet by mouth daily.   oxyCODONE 5 MG immediate release tablet Commonly known as:  Oxy IR/ROXICODONE Take 1-2 tablets (5-10 mg total) by mouth every 6 (six) hours as needed for severe pain. Replaces:  oxyCODONE-acetaminophen 7.5-325 MG tablet   pantoprazole 40 MG tablet Commonly known as:  PROTONIX Take 1 tablet (40 mg total) by mouth daily at 6 (six) AM. Start taking on:  05/23/2018   polyethylene glycol packet Commonly known as:  MIRALAX / GLYCOLAX Take 17 g by mouth 2 (two) times daily.   senna 8.6 MG Tabs tablet Commonly known as:  SENOKOT Take 1 tablet (8.6 mg total) by mouth daily.   simvastatin 40 MG tablet Commonly known as:  ZOCOR Take 40 mg by mouth at bedtime.   vitamin B-12 1000 MCG tablet Commonly known as:  CYANOCOBALAMIN Take 1,000 mcg by mouth daily.      Follow-up Information    Carlyle Basques, MD. Schedule an appointment as soon as possible for a visit in 3 week(s).   Specialty:  Infectious Diseases Contact information: [address] [place] [address] [phone]          Allergies  Allergen Reactions  . Codeine Other (See Comments)    Increases Pain and couldn't sleep    Consultations: gastroenterology  Procedures/Studies: Ct Abdomen Pelvis Wo Contrast  Result Date: 05/13/2018 CLINICAL DATA:  Anemia, unexplained, asymptomatic. Right lower back pain. Weakness. Decreased appetite. EXAM: CT ABDOMEN AND PELVIS WITHOUT CONTRAST TECHNIQUE: Multidetector CT imaging of the abdomen and pelvis was performed following the standard protocol without IV contrast. COMPARISON:  Pelvis and lumbar spine CT 04/29/2018 FINDINGS: Lower chest: Pacemaker partially included. There are coronary artery calcifications. Small circumferential pericardial effusion. Hepatobiliary: No focal hepatic lesion on noncontrast exam. Mild gallbladder distention containing layering high-density material. No pericholecystic stranding allowing for motion artifact. No biliary dilatation. Pancreas: No ductal dilatation or inflammation. Motion artifact partially obscures evaluation of the head and uncinate process. Spleen: Normal in size.  No focal abnormality on noncontrast exam. Adrenals/Urinary Tract: No adrenal nodule. Moderate hydronephrosis without ureteral dilatation. There is an 8 mm stone in the right renal pelvis. No ureteral calculi. No left hydronephrosis. Nonobstructing 6 mm stone in the upper left kidney. Subcentimeter low-density in the upper left kidney is too small to characterize but may be cyst or angiomyolipoma. Urinary bladder is partially distended without wall thickening or stone. Stomach/Bowel: Small hiatal hernia. Stomach physiologically distended. Mild motion artifact through the upper abdomen limits evaluation of abdominal bowel loops. Allowing for this, no evidence of  inflammation, obstruction or bowel wall thickening. Appendix not visualized, surgically absent per history. Moderate diffuse colonic stool burden transverse colonic tortuosity. Rectum is distended with stool measuring 7.9 cm. No definite rectal wall thickening, there is mild perirectal and presacral edema. Vascular/Lymphatic: Aortic atherosclerosis and tortuosity without aneurysm. No enlarged abdominal or pelvic lymph nodes allowing for noncontrast exam motion artifact in the abdomen. Reproductive: Uterus is atrophic, normal for age.  No adnexal mass. Other: Tiny fat containing umbilical hernia. No free air, free fluid, or intra-abdominal fluid collection. Musculoskeletal: Degenerative disc  disease at L4-L5. There are no acute or suspicious osseous abnormalities. IMPRESSION: 1. Mild right hydronephrosis with a nonobstructing 8 mm stone in the right kidney. No ureteral calculi or ureteral dilatation. Cause of hydronephrosis is not identified. Nonobstructing left renal stone without left hydronephrosis. 2. Moderate colonic stool burden with colonic tortuosity consistent with constipation. There is fecal impaction with rectal distention and perirectal edema. 3. Layering hyperdense gallbladder contents likely combination of stones and sludge. 4.  Aortic Atherosclerosis (ICD10-I70.0). Electronically Signed   By: Jeb Levering M.D.   On: 05/13/2018 04:06   Ct Lumbar Spine Wo Contrast  Result Date: 04/29/2018 CLINICAL DATA:  Right hip pain.  Back pain. EXAM: CT LUMBAR SPINE WITHOUT CONTRAST TECHNIQUE: Multidetector CT imaging of the lumbar spine was performed without intravenous contrast administration. Multiplanar CT image reconstructions were also generated. COMPARISON:  None. FINDINGS: Segmentation: 5 lumbar type vertebrae. Alignment: Normal. Vertebrae: No acute fracture or focal pathologic process. Paraspinal and other soft tissues: Negative. Other: Mild osteoarthritis of bilateral sacroiliac joints. Abdominal  aortic atherosclerosis. Disc levels: Mild degenerative disc disease with disc height loss at T11-12 and to lesser extent L1-2, L4-5. T12-L1: Mild broad-based disc bulge. Mild bilateral facet arthropathy. No foraminal stenosis. L1-L2: Broad-based disc bulge. Mild bilateral facet arthropathy. No foraminal stenosis. L2-L3: Broad-based disc bulge with a small left foraminal disc protrusion contacting the left L2 nerve root. Mild bilateral facet arthropathy. No foraminal stenosis. L3-L4: Broad-based disc bulge eccentric towards the left. Moderate bilateral facet arthropathy. Mild spinal stenosis. No foraminal stenosis. L4-L5: Broad-based disc bulge. Moderate-severe bilateral facet arthropathy. Mild spinal stenosis. No significant foraminal stenosis. L5-S1: Small right paracentral disc protrusion. No foraminal stenosis. Mild bilateral facet arthropathy. IMPRESSION: 1.  No acute osseous injury of the lumbar spine. 2. Diffuse lumbar spine spondylosis as described above. Electronically Signed   By: Kathreen Devoid   On: 04/29/2018 17:57   Ct Pelvis Wo Contrast  Result Date: 04/29/2018 CLINICAL DATA:  Pain post fall EXAM: CT PELVIS WITHOUT CONTRAST TECHNIQUE: Multidetector CT imaging of the pelvis was performed following the standard protocol without intravenous contrast. COMPARISON:  None. FINDINGS: Urinary Tract:  No abnormality visualized. Bowel:  Unremarkable visualized pelvic bowel loops. Vascular/Lymphatic: Patchy iliofemoral arterial calcifications. No adenopathy. Reproductive:  No mass or other significant abnormality Other: No ascites.  No free air. Musculoskeletal: Degenerative disc disease L4-5. L5 is a transitional segment with left-sided assimilation joint with the pelvis. Negative for fracture or dislocation. Regional soft tissues unremarkable. IMPRESSION: 1. Negative for fracture, dislocation, or other acute finding. 2. Degenerative disc disease L4-5. Electronically Signed   By: Lucrezia Europe M.D.   On:  04/29/2018 17:53   Mr Lumbar Spine Wo Contrast  Result Date: 05/13/2018 CLINICAL DATA:  Initial evaluation for persistent right-sided low back pain. EXAM: MRI LUMBAR SPINE WITHOUT CONTRAST TECHNIQUE: Multiplanar, multisequence MR imaging of the lumbar spine was performed. No intravenous contrast was administered. COMPARISON:  Prior CT from earlier the same day. FINDINGS: Segmentation: Normal segmentation. Lowest well-formed disc labeled the L5-S1 level. Alignment: Mild levoscoliosis of the thoracolumbar spine. Alignment otherwise normal with preservation of the normal lumbar lordosis. No listhesis. Vertebrae: Vertebral body heights maintained without evidence for acute or chronic fracture. Bone marrow signal intensity diffusely heterogeneous and decreased on T1 weighted imaging, suspected be related underlying anemia. No discrete or worrisome osseous lesions. There is abnormal marrow edema centered about the right aspect of the L4-5 interspace, involving both the L4 and L5 vertebral bodies (series 6, image 5). Fluid density  present within the intervening L4-5 interspace. Soft tissue edema within the adjacent right psoas muscle. Findings concerning for acute osteomyelitis discitis. Superimposed loculated paraspinous collection at the posterior right psoas muscle measures 19 x 11 mm (series 6, image 2). Probable phlegmonous changes within the adjacent right L4-5 neural foramen. No discernible epidural abscess or other collection. Soft tissue edema extends into the right posterior paraspinous musculature as well. Mild extension into the left posterior paraspinous soft tissues noted (series 6, image 17). Conus medullaris and cauda equina: Conus extends to the L1-2 level. Conus and cauda equina appear normal. Paraspinal and other soft tissues: Paraspinous changes related to osteomyelitis discitis at L4-5 as above. Paraspinous soft tissues demonstrate no other acute abnormality. Scattered T2 hyperintense cyst noted  within the kidneys bilaterally. Few of these cystic lesions demonstrate intrinsic T1 hyperintensity, likely reflecting proteinaceous and/or hemorrhagic cysts, largest of which measures 14 mm at the interpolar right kidney. Paraspinous soft tissues otherwise within normal limits. Disc levels: T11-12: Mild diffuse disc bulge with disc desiccation and reactive endplate changes. Right-sided facet degeneration. No significant stenosis. T12-L1: Mild disc bulge. Superimposed small left paracentral disc protrusion indents the left ventral thecal sac. No significant stenosis. L1-2: Diffuse disc bulge, slightly asymmetric to the right. Mild to moderate facet and ligament flavum hypertrophy. No significant stenosis. L2-3: Diffuse disc bulge with mild to moderate facet hypertrophy. Superimposed broad left subarticular/foraminal disc protrusion, with the protruding disc contacting the exiting left L2 nerve root in the left neural foramen (series 8, image 21). Mild left lateral recess and foraminal stenosis. L3-4: Diffuse disc bulge. Superimposed broad right foraminal/extraforaminal disc protrusion contacts the exiting right L3 nerve root as it courses out of the right neural foramen (series 8, image 26). Mild to moderate facet hypertrophy. Resultant mild canal with left lateral recess narrowing. Mild left L3 foraminal stenosis. L4-5: Chronic intervertebral disc space narrowing with diffuse disc bulge and reactive endplate changes. Abnormal fluid density within the right L4-5 interspace with changes suggestive of osteomyelitis discitis. Edema extends into the right pedicles of L4 and L5 with phlegmonous change within the right L4-5 neural foramen. No frank epidural abscess or other collection identified on this noncontrast examination. Mild facet and ligament flavum hypertrophy. Resultant mild canal stenosis. Moderate to severe right with mild left lateral recess narrowing. Severe right with moderate left L4 foraminal stenosis  (series 7, image 5). L5-S1: Negative interspace. Moderate right with mild left facet hypertrophy. No significant stenosis. IMPRESSION: 1. Findings concerning for acute osteomyelitis discitis involving the right aspect of the L4-5 interspace as above. Associated paraspinous edema within the adjacent right psoas muscle with superimposed 11 x 19 mm paraspinous fluid collection. Probable phlegmon within the right L4-5 neural foramen without frank epidural abscess or other collection. 2. Superimposed multifactorial degenerative changes at L4-5 with resultant moderate to severe right with moderate left L4 foraminal stenosis. 3. Small left foraminal disc protrusion at L2-3, contacting the exiting left L2 nerve root in the left neural foramen. 4. Shallow right foraminal/extraforaminal disc protrusion at L3-4, contacting the exiting right L3 nerve root as it courses out of the right neural foramen. Electronically Signed   By: Jeannine Boga M.D.   On: 05/13/2018 15:00   Ir Fluoro Guide Cv Line Right  Result Date: 05/19/2018 INDICATION: L4-L5 discitis. EXAM: FLUORO GUIDED DISC ASPIRATION AT L4-5 MEDICATIONS: The patient is currently admitted to the hospital and receiving intravenous antibiotics. The antibiotics were administered within an appropriate time frame prior to the initiation of the procedure.  ANESTHESIA/SEDATION: Fentanyl 1 mcg IV; Versed 50 mg IV. Moderate Sedation Time: 22 minutes. The patient was continuously monitored during the procedure by the interventional radiology nurse under my direct supervision. COMPLICATIONS: None immediate. PROCEDURE: Informed written consent was obtained from the patient after a thorough discussion of the procedural risks, benefits and alternatives. All questions were addressed. Maximal Sterile Barrier Technique was utilized including caps, mask, sterile gowns, sterile gloves, sterile drape, hand hygiene and skin antiseptic. A timeout was performed prior to the initiation  of the procedure. The patient was laid prone on the fluoroscopic table. The skin overlying the lumbar region was then prepped and draped in the usual sterile fashion. The skin entry site overlying the left posterolateral paraspinal area at L4-5 was then infiltrated with 0.25% bupivacaine. Using biplane intermittent fluoroscopy, a 21 gauge Franseen needle was then advanced into the L4-L5 disc space without difficulty. Crossing of the midline was noted on the AP projection. Using a 20 mL syringe, approximately 4 mL of thick diskus bloody aspirate was obtained and sent for microbiologic analysis. Hemostasis was achieved at the skin entry site. The patient tolerated the procedure well. There were no acute complications. Patient was then transported to her room in stable condition. IMPRESSION: Status post fluoroscopic guided needle aspiration at L4-5 for lumbar discitis. Electronically Signed   By: Luanne Bras M.D.   On: 05/18/2018 16:08   Ir US Guide Vasc Access Right  Result Date: 05/19/2018 INDICATION: L4-L5 discitis. EXAM: FLUORO GUIDED DISC ASPIRATION AT L4-5 MEDICATIONS: The patient is currently admitted to the hospital and receiving intravenous antibiotics. The antibiotics were administered within an appropriate time frame prior to the initiation of the procedure. ANESTHESIA/SEDATION: Fentanyl 1 mcg IV; Versed 50 mg IV. Moderate Sedation Time: 22 minutes. The patient was continuously monitored during the procedure by the interventional radiology nurse under my direct supervision. COMPLICATIONS: None immediate. PROCEDURE: Informed written consent was obtained from the patient after a thorough discussion of the procedural risks, benefits and alternatives. All questions were addressed. Maximal Sterile Barrier Technique was utilized including caps, mask, sterile gowns, sterile gloves, sterile drape, hand hygiene and skin antiseptic. A timeout was performed prior to the initiation of the procedure. The  patient was laid prone on the fluoroscopic table. The skin overlying the lumbar region was then prepped and draped in the usual sterile fashion. The skin entry site overlying the left posterolateral paraspinal area at L4-5 was then infiltrated with 0.25% bupivacaine. Using biplane intermittent fluoroscopy, a 21 gauge Franseen needle was then advanced into the L4-L5 disc space without difficulty. Crossing of the midline was noted on the AP projection. Using a 20 mL syringe, approximately 4 mL of thick diskus bloody aspirate was obtained and sent for microbiologic analysis. Hemostasis was achieved at the skin entry site. The patient tolerated the procedure well. There were no acute complications. Patient was then transported to her room in stable condition. IMPRESSION: Status post fluoroscopic guided needle aspiration at L4-5 for lumbar discitis. Electronically Signed   By: Luanne Bras M.D.   On: 05/18/2018 16:08   Ir Lumbar Disc Aspiration W/img Guide  Result Date: 05/19/2018 INDICATION: L4-L5 discitis. EXAM: FLUORO GUIDED DISC ASPIRATION AT L4-5 MEDICATIONS: The patient is currently admitted to the hospital and receiving intravenous antibiotics. The antibiotics were administered within an appropriate time frame prior to the initiation of the procedure. ANESTHESIA/SEDATION: Fentanyl 1 mcg IV; Versed 50 mg IV. Moderate Sedation Time: 22 minutes. The patient was continuously monitored during the procedure by the  interventional radiology nurse under my direct supervision. COMPLICATIONS: None immediate. PROCEDURE: Informed written consent was obtained from the patient after a thorough discussion of the procedural risks, benefits and alternatives. All questions were addressed. Maximal Sterile Barrier Technique was utilized including caps, mask, sterile gowns, sterile gloves, sterile drape, hand hygiene and skin antiseptic. A timeout was performed prior to the initiation of the procedure. The patient was laid  prone on the fluoroscopic table. The skin overlying the lumbar region was then prepped and draped in the usual sterile fashion. The skin entry site overlying the left posterolateral paraspinal area at L4-5 was then infiltrated with 0.25% bupivacaine. Using biplane intermittent fluoroscopy, a 21 gauge Franseen needle was then advanced into the L4-L5 disc space without difficulty. Crossing of the midline was noted on the AP projection. Using a 20 mL syringe, approximately 4 mL of thick diskus bloody aspirate was obtained and sent for microbiologic analysis. Hemostasis was achieved at the skin entry site. The patient tolerated the procedure well. There were no acute complications. Patient was then transported to her room in stable condition. IMPRESSION: Status post fluoroscopic guided needle aspiration at L4-5 for lumbar discitis. Electronically Signed   By: Luanne Bras M.D.   On: 05/18/2018 16:08       Subjective: Patient seen and examined the bedside this morning.  Remains comfortable.  No new issues/events.  Stable for discharge to inpatient rehab today.  Discharge Exam: Vitals:   05/21/18 2240 05/22/18 0555  BP: 138/67 (!) 160/77  Pulse: 70 68  Resp: 17 18  Temp: (!) 97.5 F (36.4 C) 98.1 F (36.7 C)  SpO2: 100% 99%   Vitals:   05/21/18 0500 05/21/18 1653 05/21/18 2240 05/22/18 0555  BP:  (!) 164/80 138/67 (!) 160/77  Pulse:  79 70 68  Resp:  20 17 18   Temp:   (!) 97.5 F (36.4 C) 98.1 F (36.7 C)  TempSrc:   Oral Oral  SpO2:  99% 100% 99%  Weight: 102.1 kg (225 lb 1.4 oz)     Height:        General: Pt is alert, awake, not in acute distress Cardiovascular: RRR, S1/S2 +, no rubs, no gallops Respiratory: CTA bilaterally, no wheezing, no rhonchi Abdominal: Soft, NT, ND, bowel sounds + Extremities: no edema, no cyanosis    The results of significant diagnostics from this hospitalization (including imaging, microbiology, ancillary and laboratory) are listed below for  reference.     Microbiology: Recent Results (from the past 240 hour(s))  Fungus Culture With Stain     Status: None (Preliminary result)   Collection Time: 05/15/18  3:09 PM  Result Value Ref Range Status   Fungus Stain Final report  Final    Comment: (NOTE) Performed At: [facility] [place], [place] [id number] Rush Farmer MD [id number]    Fungus (Mycology) Culture PENDING  Incomplete   Fungal Source ABSCESS  Final    Comment: LUMBAR ASPIRATE Performed at [facility], [place] [address], [place], [place] [address]   Aerobic/Anaerobic Culture (surgical/deep wound)     Status: None   Collection Time: 05/15/18  3:09 PM  Result Value Ref Range Status   Specimen Description ABSCESS  Final   Special Requests LUMBAR ASPIRATE  Final   Gram Stain   Final    FEW WBC PRESENT,BOTH PMN AND MONONUCLEAR NO ORGANISMS SEEN    Culture   Final    No growth aerobically or anaerobically. Performed at [facility], [address] [place], [place] [address]    Report Status 05/20/2018 FINAL  Final  Fungus Culture Result     Status: None   Collection Time: 05/15/18  3:09 PM  Result Value Ref Range Status   Result 1 Comment  Final    Comment: (NOTE) KOH/Calcofluor preparation:  no fungus observed. Performed At: [facility] [place], [place] [id number] Rush Farmer MD [id number]:[id number] Performed at [facility], [place] [address] [place], [place] [address]   Culture, blood (routine x 2)     Status: None   Collection Time: 05/16/18  9:41 PM  Result Value Ref Range Status   Specimen Description BLOOD LEFT ANTECUBITAL  Final   Special Requests   Final    BOTTLES DRAWN AEROBIC AND ANAEROBIC Blood Culture adequate volume   Culture   Final    NO GROWTH 5 DAYS Performed at [facility], [place] [address], [place], [place] [address]    Report Status 05/21/2018 FINAL  Final  Culture, blood (routine x 2)     Status: None    Collection Time: 05/16/18  9:41 PM  Result Value Ref Range Status   Specimen Description BLOOD LEFT ANTECUBITAL  Final   Special Requests   Final    BOTTLES DRAWN AEROBIC AND ANAEROBIC Blood Culture adequate volume   Culture   Final    NO GROWTH 5 DAYS Performed at [facility], [place] [address], [place], [place] [address]    Report Status 05/21/2018 FINAL  Final     Labs: BNP (last 3 results) No results for input(s): BNP in the last 8760 hours. Basic Metabolic Panel: Recent Labs  Lab 05/16/18 0517 05/17/18 0205 05/18/18 0434 05/19/18 0459 05/20/18 0439 05/22/18 0344  NA 131* 129* 132* 131* 132* 131*  K 4.0 4.0 4.3 4.3 4.4 4.8  CL 98* 95* 101 102 103 106  CO2 22 19* 21* 20* 22 20*  GLUCOSE 238* 291* 223* 216* 292* 178*  BUN 113* 106* 86* 73* 68* 55*  CREATININE 2.80* 2.83* 2.51* 1.92* 1.90* 1.69*  CALCIUM 9.5 9.4 9.5 9.0 8.6* 8.5*  MG 2.4 2.1 2.1  --   --   --    Liver Function Tests: Recent Labs  Lab 05/19/18 0459  AST 25  ALT 19  ALKPHOS 52  BILITOT 0.7  PROT 4.8*  ALBUMIN 2.3*   No results for input(s): LIPASE, AMYLASE in the last 168 hours. No results for input(s): AMMONIA in the last 168 hours. CBC: Recent Labs  Lab 05/17/18 0205 05/18/18 0434 05/19/18 0459 05/20/18 0439 05/22/18 0344  WBC 8.0 8.6 8.6 10.1 9.4  NEUTROABS  --   --   --   --  8.1*  HGB 10.7* 10.2* 10.1* 9.7* 8.7*  HCT 32.2* 30.1* 30.2* 29.0* 26.6*  MCV 88.2 87.8 87.5 89.2 89.6  PLT 237 197 180 153 143*   Cardiac Enzymes: Recent Labs  Lab 05/22/18 0344  CKTOTAL 139   BNP: Invalid input(s): POCBNP CBG: Recent Labs  Lab 05/21/18 0832 05/21/18 1206 05/21/18 1651 05/21/18 2016 05/22/18 0807  GLUCAP 190* 214* 231* 225* 142*   D-Dimer No results for input(s): DDIMER in the last 72 hours. Hgb A1c No results for input(s): HGBA1C in the last 72 hours. Lipid Profile No results for input(s): CHOL, HDL, LDLCALC, TRIG, CHOLHDL, LDLDIRECT in the last 72 hours. Thyroid  function studies No results for input(s): TSH, T4TOTAL, T3FREE, THYROIDAB in the last 72 hours.  Invalid input(s): FREET3 Anemia work up No results for input(s): VITAMINB12, FOLATE, FERRITIN, TIBC, IRON, RETICCTPCT in the last 72 hours. Urinalysis    Component Value Date/Time   COLORURINE YELLOW 05/13/2018 0140   APPEARANCEUR HAZY (A) 05/13/2018 0140   LABSPEC 1.013 05/13/2018 0140   PHURINE 5.0 05/13/2018 0140   GLUCOSEU NEGATIVE 05/13/2018 0140   HGBUR SMALL (A) 05/13/2018 0140   BILIRUBINUR NEGATIVE 05/13/2018 0140   KETONESUR NEGATIVE 05/13/2018 0140   PROTEINUR NEGATIVE 05/13/2018 0140   NITRITE NEGATIVE 05/13/2018 0140   LEUKOCYTESUR SMALL (A) 05/13/2018 0140   Sepsis Labs Invalid input(s): PROCALCITONIN,  WBC,  LACTICIDVEN Microbiology Recent Results (from the past 240 hour(s))  Fungus Culture With Stain     Status: None (Preliminary result)   Collection Time: 05/15/18  3:09 PM  Result Value Ref Range Status   Fungus Stain Final report  Final    Comment: (NOTE) Performed At: [facility] [place], [place] [id number] Rush Farmer MD DZ:[id number]    Fungus (Mycology) Culture PENDING  Incomplete   Fungal Source ABSCESS  Final    Comment: LUMBAR ASPIRATE Performed at [facility], [place] [address], [place], [place] [address]   Aerobic/Anaerobic Culture (surgical/deep wound)     Status: None   Collection Time: 05/15/18  3:09 PM  Result Value Ref Range Status   Specimen Description ABSCESS  Final   Special Requests LUMBAR ASPIRATE  Final   Gram Stain   Final    FEW WBC PRESENT,BOTH PMN AND MONONUCLEAR NO ORGANISMS SEEN    Culture   Final    No growth aerobically or anaerobically. Performed at [facility], [place] [address], [place], [place] [address]    Report Status 05/20/2018 FINAL  Final  Fungus Culture Result     Status: None   Collection Time: 05/15/18  3:09 PM  Result Value Ref Range Status   Result 1 Comment  Final     Comment: (NOTE) KOH/Calcofluor preparation:  no fungus observed. Performed At: [facility] [place], [place] [id number] Rush Farmer MD EY:[id number] Performed at [facility], [place] [address], [place], [place] [address]   Culture, blood (routine x 2)     Status: None   Collection Time: 05/16/18  9:41 PM  Result Value Ref Range Status   Specimen Description BLOOD LEFT ANTECUBITAL  Final   Special Requests   Final    BOTTLES DRAWN AEROBIC AND ANAEROBIC Blood Culture adequate volume   Culture   Final    NO GROWTH 5 DAYS Performed at [facility], [place] [address], [place], [place] [address]    Report Status 05/21/2018 FINAL  Final  Culture, blood (routine x 2)     Status: None   Collection Time: 05/16/18  9:41 PM  Result Value Ref Range Status   Specimen Description BLOOD LEFT ANTECUBITAL  Final   Special Requests   Final    BOTTLES DRAWN AEROBIC AND ANAEROBIC Blood Culture adequate volume   Culture   Final    NO GROWTH 5 DAYS Performed at [facility], [place] [address] [place], [place] [address]    Report Status 05/21/2018 FINAL  Final    Please note: You were cared for by a hospitalist during your hospital stay. Once you are discharged, your primary care physician will handle any further medical issues. Please note that NO REFILLS for any discharge medications will be authorized once you are discharged, as  it is imperative that you return to your primary care physician (or establish a relationship with a primary care physician if you do not have one) for your post hospital discharge needs so that they can reassess your need for medications and monitor your lab values.    Time coordinating discharge: 40 minutes  SIGNED:   Shelly Coss, MD  [facility] 05/22/2018, 11:59 AM Pager [phone]  If 7PM-7AM, please contact night-coverage [email or web address] Password [email or web address]

## 2018-05-22 NOTE — Progress Notes (Signed)
Physical Medicine and Rehabilitation Consult   Reason for Consult: Lumbar diskitis.  Referring Physician: Dr. Tawanna Solo   HPI: Erika Fry is a 72 y.o. female with history of HTN, CKD, T2DM, recent admission for fall due to UTI with back pain and was discharged to SNF on 05/05/18. MRI not done due to recent PPM 5/7 and baseline CKD. She was readmitted on 05/12/18 with acute on chronic anemia, reports of constipation and hypotension. She was treated with fluid bolus and PPI. MRI spine done revealing acute osteomyelitis of L4/5 with Paraspinous edema within right psoas muscle and Paraspinous fluid collection as well as moderate to severe right and moderate left foraminal stenosis. Gi consulted for input and recommended supportive care till medical work up complete. She underwent aspiration of fluid collection and cultures negative.  ID consulted for input and recommended at least 6 weeks of IV  Ceftriaxone and renally dosed daptomycin.     She has been transfused with 6 units PRBC during her stay. She had had episodes of heme positive stools and  underwent EGD yesterday revealing single friable gastric pylorus polyp that was biopsied by Dr. Fuller Plan.  To follow up on outpatient basis for colonoscopy and f/u EGD.  Therapy ongoing and patient limited by pain and fatigue. Family requesting CIR for follow up therapy.    Review of Systems  Constitutional: Negative for chills and fever.  HENT: Negative for hearing loss and tinnitus.   Eyes: Negative for blurred vision and double vision.  Respiratory: Negative for cough and shortness of breath.   Cardiovascular: Negative for chest pain and palpitations.  Gastrointestinal: Negative for heartburn and nausea.  Genitourinary: Negative for dysuria and urgency.  Musculoskeletal: Positive for back pain and myalgias.  Skin: Negative for rash.  Neurological: Positive for sensory change and focal weakness.  Psychiatric/Behavioral: The patient is  nervous/anxious.         Past Medical History:  Diagnosis Date  . Arthritis    "knees, thumbs" (03/25/2018)  . CKD (chronic kidney disease), stage IV ([place])   . Diet-controlled diabetes mellitus ([place])   . GERD (gastroesophageal reflux disease)   . Gout    "on daily RX" (03/25/2018)  . Heart murmur   . High cholesterol   . Hypertension   . Hypothyroidism   . Iron deficiency anemia    "had to get an iron infusion"  . Migraine    "used to have them growing up" (03/25/2018)  . Presence of permanent cardiac pacemaker 03/25/2018         Past Surgical History:  Procedure Laterality Date  . APPENDECTOMY    . INSERT / REPLACE / REMOVE PACEMAKER  03/25/2018  . IR FLUORO GUIDE CV LINE RIGHT  05/18/2018  . IR LUMBAR DISC ASPIRATION W/IMG GUIDE  05/15/2018  . IR US GUIDE VASC ACCESS RIGHT  05/18/2018  . KNEE ARTHROSCOPY Bilateral   . PACEMAKER IMPLANT N/A 03/25/2018   Procedure: PACEMAKER IMPLANT;  Surgeon: Evans Lance, MD;  Location: [place] [facility];  Service: Cardiovascular;  Laterality: N/A;  . PACEMAKER PLACEMENT Left 03/2018  . RIGHT HEART CATH N/A 03/20/2018   Procedure: RIGHT HEART CATH;  Surgeon: Nigel Mormon, MD;  Location: [place] [facility];  Service: Cardiovascular;  Laterality: N/A;  . TEE WITHOUT CARDIOVERSION N/A 03/20/2018   Procedure: TRANSESOPHAGEAL ECHOCARDIOGRAM (TEE);  Surgeon: Nigel Mormon, MD;  Location: [facility] ENDOSCOPY;  Service: Cardiovascular;  Laterality: N/A;  . TONSILLECTOMY  Family History  Problem Relation Age of Onset  . Hypertension Mother   . Diabetes Mellitus II Father   . Heart disease Father   . Gastric cancer Brother   . Diabetes Mellitus II Brother     Social History:  Lives alone and independent prior to fall last month. Family works. She reports that she has never smoked. She has never used smokeless tobacco. She reports that she drank alcohol. She reports that she does not use  drugs.         Allergies  Allergen Reactions  . Codeine Other (See Comments)    Increases Pain and couldn't sleep          Medications Prior to Admission  Medication Sig Dispense Refill  . acetaminophen (TYLENOL) 325 MG tablet Take 2 tablets (650 mg total) by mouth every 6 (six) hours as needed for mild pain.    [name] Kitchen allopurinol (ZYLOPRIM) 100 MG tablet Take 2 tablets (200 mg total) by mouth daily.    [name] Kitchen amLODipine (NORVASC) 10 MG tablet Take 10 mg by mouth daily.    [name] Kitchen aspirin EC 81 MG tablet Take 81 mg by mouth daily.    . Biotin (BIOTIN FORTE) 3 MG TABS Take 3 mg by mouth daily.    . cetirizine (ZYRTEC) 10 MG tablet Take 10 mg by mouth at bedtime.     . fluticasone (FLONASE) 50 MCG/ACT nasal spray Place 2 sprays into both nostrils daily as needed for allergies or rhinitis.    . folic acid (FOLVITE) 1 MG tablet Take 1 mg by mouth daily.    . furosemide (LASIX) 80 MG tablet Take 80 mg by mouth 2 (two) times daily.     . hydrALAZINE (APRESOLINE) 50 MG tablet Take 50 mg by mouth 3 (three) times daily.    . insulin aspart (NOVOLOG) 100 UNIT/ML injection Inject 0-15 Units into the skin 3 (three) times daily with meals. CBG < 70: implement hypoglycemia protocol CBG 70 - 120: 0 units CBG 121 - 150: 2 units CBG 151 - 200: 3 units CBG 201 - 250: 5 units CBG 251 - 300: 8 units CBG 301 - 350: 11 units CBG 351 - 400: 15 units CBG > 400: call MD.    . insulin aspart (NOVOLOG) 100 UNIT/ML injection Inject 0-5 Units into the skin at bedtime. Correction coverage: HS scale CBG < 70: implement hypoglycemia protocol CBG 70 - 120: 0 units CBG 121 - 150: 0 units CBG 151 - 200: 0 units CBG 201 - 250: 2 units CBG 251 - 300: 3 units CBG 301 - 350: 4 units CBG 351 - 400: 5 units CBG > 400: call MD.    . insulin glargine (LANTUS) 100 UNIT/ML injection Inject 0.05 mLs (5 Units total) into the skin daily. (Patient taking differently: Inject 5 Units into the skin at bedtime. )     . levothyroxine (SYNTHROID, LEVOTHROID) 75 MCG tablet Take 75 mcg by mouth daily before breakfast.    . [EXPIRED] methocarbamol (ROBAXIN) 500 MG tablet Take 500 mg by mouth every 8 (eight) hours.    . Multiple Vitamin (MULTI-VITAMIN PO) Take 1 tablet by mouth daily.    . polyethylene glycol (MIRALAX / GLYCOLAX) packet Take 17 g by mouth 2 (two) times daily.    . ranitidine (ZANTAC) 150 MG tablet Take 150 mg by mouth daily.     [name] Kitchen senna (SENOKOT) 8.6 MG TABS tablet Take 1 tablet (8.6 mg total) by mouth daily.    [name] Kitchen  simvastatin (ZOCOR) 40 MG tablet Take 40 mg by mouth at bedtime.     . vitamin B-12 (CYANOCOBALAMIN) 1000 MCG tablet Take 1,000 mcg by mouth daily.    [name] Kitchen oxyCODONE-acetaminophen (PERCOCET) 7.5-325 MG tablet Take 1 tablet by mouth every 6 (six) hours.      Home: Home Living Family/patient expects to be discharged to:: Private residence Living Arrangements: Alone Available Help at Discharge: Family, Available PRN/intermittently Type of Home: House Home Access: Stairs to enter [employer] of Steps: 2 Entrance Stairs-Rails: None Home Layout: Two level, Able to live on main level with bedroom/bathroom Bathroom Shower/Tub: Multimedia programmer: Handicapped height Home Equipment: None  Functional History: Prior Function Level of Independence: Needs assistance Gait / Transfers Assistance Needed: Independent prior to admission at the end of May after fall. Since then pt at most has amb very short (15') distance with walker and 2 person assist. Functional Status:  Mobility: Bed Mobility Overal bed mobility: Needs Assistance Bed Mobility: Rolling, Sidelying to Sit, Sit to Sidelying Rolling: Mod assist Sidelying to sit: Max assist, +2 for safety/equipment Sit to sidelying: Max assist, +2 for physical assistance General bed mobility comments: Assist to move legs, elevate trunk into sitting and bring hips to EOB. Assist to lower trunk and  bring legs back up into bed. Transfers Overall transfer level: Needs assistance Transfer via Lift Equipment: Stedy Transfers: Sit to/from Stand Sit to Stand: +2 physical assistance, From elevated surface, Max assist General transfer comment: 2 attemtpts to stand with use of Stedy standing frame, gait belt, and bed pad to aid in hip extension; pt unable to achieve standing up enough for Stedy to be used for transfer; cues for sequencing and technique  ADL: ADL Overall ADL's : Needs assistance/impaired Grooming: Sitting, Wash/dry hands, Wash/dry face, Minimal assistance Upper Body Bathing: Moderate assistance, Sitting Lower Body Bathing: Total assistance Upper Body Dressing : Sitting, Moderate assistance Lower Body Dressing: Total assistance Toilet Transfer Details (indicate cue type and reason): will need +2 assist Toileting- Clothing Manipulation and Hygiene: Total assistance, Bed level General ADL Comments: pt continues to have limitations due to pain and weakness, Poor endurance  Cognition: Cognition Overall Cognitive Status: Within Functional Limits for tasks assessed Orientation Level: Oriented X4 Cognition Arousal/Alertness: Awake/alert Behavior During Therapy: WFL for tasks assessed/performed Overall Cognitive Status: Within Functional Limits for tasks assessed   Blood pressure (!) 141/73, pulse 72, temperature (!) 97.5 F (36.4 C), temperature source Oral, resp. rate 16, height 5\' 5"  (1.651 m), weight 97.5 kg (215 lb), SpO2 98 %. Physical Exam  Nursing note and vitals reviewed. Constitutional: She is oriented to person, place, and time. She appears well-developed.  Obese female lying in bed with LSO.  NAD  HENT:  Head: Normocephalic.  Eyes: Pupils are equal, round, and reactive to light.  Neck: Normal range of motion.  Cardiovascular: Normal rate.  Respiratory: Effort normal.  Musculoskeletal: She exhibits tenderness (low back TTP and with leg movement).    Neurological: She is alert and oriented to person, place, and time. No cranial nerve deficit.  UE 5/5. LE: 3-/5 prox to 4/5 distally. No sensory changes  Psychiatric: She has a normal mood and affect. Her behavior is normal. Judgment and thought content normal.    LabResultsLast24Hours       Results for orders placed or performed during the hospital encounter of 05/12/18 (from the past 24 hour(s))  Glucose, capillary     Status: Abnormal   Collection Time: 05/19/18  4:39 PM  Result Value Ref Range   Glucose-Capillary 109 (H) 65 - 99 mg/dL  Glucose, capillary     Status: Abnormal   Collection Time: 05/19/18  9:51 PM  Result Value Ref Range   Glucose-Capillary 176 (H) 65 - 99 mg/dL  Basic metabolic panel     Status: Abnormal   Collection Time: 05/20/18  4:39 AM  Result Value Ref Range   Sodium 132 (L) 135 - 145 mmol/L   Potassium 4.4 3.5 - 5.1 mmol/L   Chloride 103 101 - 111 mmol/L   CO2 22 22 - 32 mmol/L   Glucose, Bld 292 (H) 65 - 99 mg/dL   BUN 68 (H) 6 - 20 mg/dL   Creatinine, Ser 1.90 (H) 0.44 - 1.00 mg/dL   Calcium 8.6 (L) 8.9 - 10.3 mg/dL   GFR calc non Af Amer 25 (L) >60 mL/min   GFR calc Af Amer 29 (L) >60 mL/min   Anion gap 7 5 - 15  CBC     Status: Abnormal   Collection Time: 05/20/18  4:39 AM  Result Value Ref Range   WBC 10.1 4.0 - 10.5 K/uL   RBC 3.25 (L) 3.87 - 5.11 MIL/uL   Hemoglobin 9.7 (L) 12.0 - 15.0 g/dL   HCT 29.0 (L) 36.0 - 46.0 %   MCV 89.2 78.0 - 100.0 fL   MCH 29.8 26.0 - 34.0 pg   MCHC 33.4 30.0 - 36.0 g/dL   RDW 16.2 (H) 11.5 - 15.5 %   Platelets 153 150 - 400 K/uL  Glucose, capillary     Status: Abnormal   Collection Time: 05/20/18  7:46 AM  Result Value Ref Range   Glucose-Capillary 260 (H) 65 - 99 mg/dL  Glucose, capillary     Status: Abnormal   Collection Time: 05/20/18 12:15 PM  Result Value Ref Range   Glucose-Capillary 267 (H) 65 - 99 mg/dL     ImagingResults(Last48hours)  No results  found.    Assessment/Plan: Diagnosis: functional deficits secondary to L4-5 osteomyelitis and debility 1. Does the need for close, 24 hr/day medical supervision in concert with the patient's rehab needs make it unreasonable for this patient to be served in a less intensive setting? Yes 2. Co-Morbidities requiring supervision/potential complications: morbid obesity, CKD, DM, HTN, upper GI bleed, IV antibiotics, pain mgt 3. Due to bladder management, bowel management, safety, skin/wound care, disease management, medication administration, pain management and patient education, does the patient require 24 hr/day rehab nursing? Yes 4. Does the patient require coordinated care of a physician, rehab nurse, PT (1-2 hrs/day, 5 days/week) and OT (1-2 hrs/day, 5 days/week) to address physical and functional deficits in the context of the above medical diagnosis(es)? Yes Addressing deficits in the following areas: balance, endurance, locomotion, strength, transferring, bowel/bladder control, bathing, dressing, feeding, grooming, toileting and psychosocial support 5. Can the patient actively participate in an intensive therapy program of at least 3 hrs of therapy per day at least 5 days per week? Yes 6. The potential for patient to make measurable gains while on inpatient rehab is excellent 7. Anticipated functional outcomes upon discharge from inpatient rehab are supervision and min assist  with PT, supervision and min assist with OT, n/a with SLP. 8. Estimated rehab length of stay to reach the above functional goals is: 15-20 days 9. Anticipated D/C setting: Home 10. Anticipated post D/C treatments: HH therapy and Outpatient therapy 11. Overall Rehab/Functional Prognosis: excellent  RECOMMENDATIONS: This patient's condition is appropriate for continued rehabilitative care in the  following setting: CIR Patient has agreed to participate in recommended program. Yes Note that insurance prior authorization  may be required for reimbursement for recommended care.  Comment: Rehab Admissions Coordinator to follow up.  Thanks,  Meredith Staggers, MD, Mellody Drown  I have personally performed a face to face diagnostic evaluation of this patient. Additionally, I have reviewed and concur with the physician assistant's documentation above.      Bary Leriche, PA-C 05/20/2018          Revision History                        Routing History

## 2018-05-22 NOTE — Progress Notes (Signed)
Chaplain Note:  Request is to be on the list for [employer] from [employer]. I have checked the list and she is on it. They come on M, W, and F of each week.   Wells Guiles

## 2018-05-23 ENCOUNTER — Inpatient Hospital Stay (HOSPITAL_COMMUNITY): Payer: Medicare Other

## 2018-05-23 ENCOUNTER — Inpatient Hospital Stay (HOSPITAL_COMMUNITY): Payer: Medicare Other | Admitting: Physical Therapy

## 2018-05-23 LAB — GLUCOSE, CAPILLARY
GLUCOSE-CAPILLARY: 93 mg/dL (ref 65–99)
GLUCOSE-CAPILLARY: 85 mg/dL (ref 65–99)
Glucose-Capillary: 91 mg/dL (ref 65–99)

## 2018-05-23 LAB — CBC WITH DIFFERENTIAL/PLATELET
ABS IMMATURE GRANULOCYTES: 0.1 10*3/uL (ref 0.0–0.1)
BASOS ABS: 0 10*3/uL (ref 0.0–0.1)
Basophils Relative: 0 %
Eosinophils Absolute: 0.2 10*3/uL (ref 0.0–0.7)
Eosinophils Relative: 3 %
HCT: 29.2 % — ABNORMAL LOW (ref 36.0–46.0)
HEMOGLOBIN: 9.6 g/dL — AB (ref 12.0–15.0)
Immature Granulocytes: 1 %
LYMPHS ABS: 0.6 10*3/uL — AB (ref 0.7–4.0)
LYMPHS PCT: 8 %
MCH: 29.7 pg (ref 26.0–34.0)
MCHC: 32.9 g/dL (ref 30.0–36.0)
MCV: 90.4 fL (ref 78.0–100.0)
MONO ABS: 0.7 10*3/uL (ref 0.1–1.0)
Monocytes Relative: 9 %
NEUTROS ABS: 6.6 10*3/uL (ref 1.7–7.7)
Neutrophils Relative %: 79 %
Platelets: 154 10*3/uL (ref 150–400)
RBC: 3.23 MIL/uL — AB (ref 3.87–5.11)
RDW: 16.3 % — ABNORMAL HIGH (ref 11.5–15.5)
WBC: 8.2 10*3/uL (ref 4.0–10.5)

## 2018-05-23 LAB — COMPREHENSIVE METABOLIC PANEL
ALBUMIN: 2.3 g/dL — AB (ref 3.5–5.0)
ALT: 26 U/L (ref 14–54)
ANION GAP: 8 (ref 5–15)
AST: 29 U/L (ref 15–41)
Alkaline Phosphatase: 53 U/L (ref 38–126)
BILIRUBIN TOTAL: 0.4 mg/dL (ref 0.3–1.2)
BUN: 48 mg/dL — ABNORMAL HIGH (ref 6–20)
CHLORIDE: 104 mmol/L (ref 101–111)
CO2: 21 mmol/L — ABNORMAL LOW (ref 22–32)
Calcium: 8.9 mg/dL (ref 8.9–10.3)
Creatinine, Ser: 1.62 mg/dL — ABNORMAL HIGH (ref 0.44–1.00)
GFR calc Af Amer: 35 mL/min — ABNORMAL LOW (ref >60)
GFR calc non Af Amer: 30 mL/min — ABNORMAL LOW (ref >60)
Glucose, Bld: 99 mg/dL (ref 65–99)
POTASSIUM: 4.9 mmol/L (ref 3.5–5.1)
Sodium: 133 mmol/L — ABNORMAL LOW (ref 135–145)
TOTAL PROTEIN: 5.2 g/dL — AB (ref 6.5–8.1)

## 2018-05-23 MED ORDER — SODIUM CHLORIDE 0.9% FLUSH
10.0000 mL | INTRAVENOUS | Status: DC | PRN
  Administered 2018-05-23 – 2018-05-28 (×3): 20 mL
  Administered 2018-05-29: 10 mL
  Administered 2018-05-29: 20 mL
  Administered 2018-05-30 – 2018-06-01 (×2): 10 mL
  Administered 2018-06-01 (×2): 20 mL
  Administered 2018-06-02: 30 mL
  Administered 2018-06-02: 10 mL
  Administered 2018-06-05: 20 mL
  Administered 2018-06-05 – 2018-06-10 (×4): 10 mL
  Administered 2018-06-14: 20 mL
  Administered 2018-06-15 – 2018-06-22 (×6): 10 mL
  Filled 2018-05-23 (×23): qty 40

## 2018-05-23 MED ORDER — GABAPENTIN 300 MG PO CAPS
600.0000 mg | ORAL_CAPSULE | Freq: Two times a day (BID) | ORAL | Status: DC
  Administered 2018-05-23 – 2018-05-28 (×10): 600 mg via ORAL
  Filled 2018-05-23 (×10): qty 2

## 2018-05-23 MED ORDER — GABAPENTIN 300 MG PO CAPS
300.0000 mg | ORAL_CAPSULE | Freq: Once | ORAL | Status: AC
  Administered 2018-05-23: 300 mg via ORAL
  Filled 2018-05-23: qty 1

## 2018-05-23 MED ORDER — METHOCARBAMOL 750 MG PO TABS
750.0000 mg | ORAL_TABLET | Freq: Three times a day (TID) | ORAL | Status: DC | PRN
  Administered 2018-05-23 – 2018-05-27 (×8): 750 mg via ORAL
  Filled 2018-05-23 (×9): qty 1

## 2018-05-23 NOTE — Evaluation (Signed)
Occupational Therapy Assessment and Plan  Patient Details  Name: Erika Fry MRN: [id number] Date of Birth: 1946-10-12  OT Diagnosis: acute pain, lumbago (low back pain) and muscle weakness (generalized) Rehab Potential: Rehab Potential (ACUTE ONLY): Fair ELOS: 24-28 days   Today's Date: 05/23/2018 OT Individual Time: 1000-1035 Session 2: [id number] OT Individual Time Calculation (min): 35 min, 15 min session 2  and Today's Date: 05/23/2018 OT Missed Time: 25 Minutes, 60 min from session 2 Missed Time Reason: Pain    Problem List:  Patient Active Problem List   Diagnosis Date Noted  . Diskitis 05/22/2018  . Occult blood in stools   . Gastric polyp   . Lumbar discitis 05/17/2018  . Blood loss anemia 05/12/2018  . Hyperlipidemia associated with type 2 diabetes mellitus ([place]) 05/10/2018  . Essential hypertension 04/29/2018  . CKD (chronic kidney disease), stage IV ([place])   . Diet-controlled diabetes mellitus ([place])   . GERD (gastroesophageal reflux disease)   . Gout   . High cholesterol   . Hypothyroidism   . Iron deficiency anemia   . AV block, Mobitz 2 03/25/2018  . Mobitz type 2 second degree AV block 03/20/2018  . Aortic stenosis 03/20/2018  . Exertional dyspnea 03/19/2018    Past Medical History:  Past Medical History:  Diagnosis Date  . Arthritis    "knees, thumbs" (03/25/2018)  . CKD (chronic kidney disease), stage IV ([place])   . Diet-controlled diabetes mellitus ([place])   . GERD (gastroesophageal reflux disease)   . Gout    "on daily RX" (03/25/2018)  . Heart murmur   . High cholesterol   . Hypertension   . Hypothyroidism   . Iron deficiency anemia    "had to get an iron infusion"  . Migraine    "used to have them growing up" (03/25/2018)  . Presence of permanent cardiac pacemaker 03/25/2018   Past Surgical History:  Past Surgical History:  Procedure Laterality Date  . APPENDECTOMY    . BIOPSY  05/19/2018   Procedure: BIOPSY;  Surgeon: Ladene Artist,  MD;  Location[facility] ENDOSCOPY;  Service: Endoscopy;;  . ESOPHAGOGASTRODUODENOSCOPY N/A 05/19/2018   Procedure: ESOPHAGOGASTRODUODENOSCOPY (EGD);  Surgeon: Ladene Artist, MD;  Location: [facility] ENDOSCOPY;  Service: Endoscopy;  Laterality: N/A;  . INSERT / REPLACE / REMOVE PACEMAKER  03/25/2018  . IR FLUORO GUIDE CV LINE RIGHT  05/18/2018  . IR LUMBAR DISC ASPIRATION W/IMG GUIDE  05/15/2018  . IR US GUIDE VASC ACCESS RIGHT  05/18/2018  . KNEE ARTHROSCOPY Bilateral   . PACEMAKER IMPLANT N/A 03/25/2018   Procedure: PACEMAKER IMPLANT;  Surgeon: Evans Lance, MD;  Location: [facility] CV LAB;  Service: Cardiovascular;  Laterality: N/A;  . PACEMAKER PLACEMENT Left 03/2018  . RIGHT HEART CATH N/A 03/20/2018   Procedure: RIGHT HEART CATH;  Surgeon: Nigel Mormon, MD;  Location: [place] CV LAB;  Service: Cardiovascular;  Laterality: N/A;  . TEE WITHOUT CARDIOVERSION N/A 03/20/2018   Procedure: TRANSESOPHAGEAL ECHOCARDIOGRAM (TEE);  Surgeon: Nigel Mormon, MD;  Location: [facility] ENDOSCOPY;  Service: Cardiovascular;  Laterality: N/A;  . TONSILLECTOMY      Assessment & Plan Clinical Impression: Erika Fry is a 72 year old female with history of HTN, CKD, anemia of chronic disease, T2DM, PPM 4/19, recent admission for fall due to UTI with back pain (no MRI due to recent PPM) treated with steroids and she was discharged to SNF on 05/05/18. She was readmitted on 05/12/18 with constipation, hypotension and acute  on chronic anemia with Hgb 6.9. She was treated with fluid boluses and started on PPI. MRI of spine done revealing acute osteomyelitis of L4/5 with paraspinous edema within the right psoas muscle, paraspinous fluid collection as well as moderate to severe right and moderate left foraminal stenosis. GI was consulted for input and recommended supportive care to the medical work-up is complete. She underwent aspiration of fluid collection from her back and cultures are negative. ID was consulted  for input and recommended at least 6 weeks of IV ceftriaxone and renally dosed daptomycin--end date July 24th.  She has been transfused with 6 units packed red blood cells during her stay. She was treated with decadron for pain control and this was d/c today. Renal status has improved off furosemide. She has had episodes of heme positive stools and underwent EGD to revealing single friable gastric polyp pylorus polyp that was biopsied by Dr. Fuller Plan. She is to follow-up on outpatient basis for colonoscopy and follow-up EGD. Therapy ongoing and patient continues to be limited by pain neuropathy bilateral lower extremity as well as fatigue. CIR was requested due to functional decline. Patient transferred to CIR on 05/22/2018 .    Patient currently requires unknown with basic self-care skills secondary to muscle weakness and decreased coordination and severe lumbar pain.  Prior to hospitalization, patient could complete ADLs with modified independent .  Patient will benefit from skilled intervention to decrease level of assist with basic self-care skills and increase level of independence with iADL prior to discharge home independently.  Anticipate patient will require intermittent supervision and minimal physical assistance and follow up home health.  OT - End of Session Activity Tolerance: Decreased this session Endurance Deficit: Yes Endurance Deficit Description: decreased, increased work of breathing 2/2 pain and w/ any active movement OT Assessment Rehab Potential (ACUTE ONLY): Fair OT Patient demonstrates impairments in the following area(s): Endurance;Pain;Motor;Balance OT Basic ADL's Functional Problem(s): Toileting;Grooming;Bathing;Dressing OT Transfers Functional Problem(s): Toilet;Tub/Shower OT Plan OT Intensity: Minimum of 1-2 x/day, 45 to 90 minutes OT Frequency: 5 out of 7 days OT Duration/Estimated Length of Stay: 24-28 days OT Treatment/Interventions: Balance/vestibular  training;Discharge planning;DME/adaptive equipment instruction;Functional mobility training;Neuromuscular re-education;Pain management;Patient/family education;Psychosocial support;Self Care/advanced ADL retraining;Therapeutic Exercise;Therapeutic Activities;UE/LE Strength taining/ROM;UE/LE Coordination activities;Wheelchair propulsion/positioning;Community reintegration OT Self Feeding Anticipated Outcome(s): Mod I OT Basic Self-Care Anticipated Outcome(s): Min A OT Toileting Anticipated Outcome(s): Min A OT Bathroom Transfers Anticipated Outcome(s): Min A OT Recommendation Patient destination: Home Follow Up Recommendations: Home health OT Equipment Recommended: To be determined   Skilled Therapeutic Intervention  Session 1: Limited OT eval completed. Pt supine in bed c/o 10/10 pain in her low back, radiating down to her R leg and hip for entire session. Pt intermittently screaming out in pain w/o movement supine. Pt edu re rehab process, OT POC, ELOS, and d/c planning. Eval limited d/t pain and pt unable to tolerate OOB mobility at this time. Pt was able to participate briefly in B UE strengthening exercises with 1 lb dumbbells but had to stop d/t pain. Pt requested to end session and was left supine with bed alarm set.   Session 2: Care coordination with nursing completed re pain medication schedule and coordinating with therapy. Pt was easily awoken with vc, supine in bed. Pt continues to c/o 10/10 pain in lumbar back that radiates down to hip and leg. Encouraged pt to attempt bed mobility or to use lift to get OOB but pt adamantly refused all, stating she is in too much pain. Pt was  left in bed with all needs met. OT will continue to f/u.   OT Evaluation Precautions/Restrictions  Precautions Precautions: Fall Precaution Comments: Pacemaker placement 4 weeks ago Required Braces or Orthoses: Spinal Brace(LSO for comfort) Spinal Brace: Lumbar corset Restrictions Weight Bearing  Restrictions: No General OT Amount of Missed Time: 25 Minutes Therapy Vitals Temp: 97.9 F (36.6 C) Temp Source: Oral Pulse Rate: 80 Resp: 15 BP: (!) 151/73 Patient Position (if appropriate): Lying Oxygen Therapy SpO2: 96 % O2 Device: Room Air Pain Pain Assessment Pain Scale: 0-10 Pain Score: 10-Worst pain ever Pain Type: Acute pain Pain Location: Back Pain Orientation: Right Pain Descriptors / Indicators: Constant;Sharp Pain Onset: On-going Pain Intervention(s): Emotional support Home Living/Prior Functioning Home Living Family/patient expects to be discharged to:: Private residence Living Arrangements: Alone Available Help at Discharge: Family, Available 24 hours/day Type of Home: House Home Access: Stairs to enter Technical brewer of Steps: 2 Entrance Stairs-Rails: None Home Layout: Two level, Able to live on main level with bedroom/bathroom Bathroom Shower/Tub: Multimedia programmer: Handicapped height  Lives With: Alone IADL History Homemaking Responsibilities: Yes Meal Prep Responsibility: Primary Laundry Responsibility: Primary Cleaning Responsibility: Primary Bill Paying/Finance Responsibility: Primary Shopping Responsibility: Primary Child Care Responsibility: Primary Current License: Yes Mode of Transportation: Car Occupation: Retired Leisure and Hobbies: Wellsite geologist out with friends, work in the yard Prior Function Level of Independence: Independent with basic ADLs, Independent with transfers, Independent with homemaking with ambulation, Independent with gait  Able to Take Stairs?: Yes Driving: Yes Vocation: Retired ADL ADL ADL Comments: See functional navigator Vision Baseline Vision/History: Wears glasses Wears Glasses: Reading only Patient Visual Report: No change from baseline Vision Assessment?: No apparent visual deficits Perception  Perception: Within Functional Limits Praxis Praxis: Intact Cognition Overall Cognitive  Status: Within Functional Limits for tasks assessed Arousal/Alertness: Awake/alert Orientation Level: Person;Place;Situation Person: Oriented Place: Oriented Situation: Oriented Year: 2019 Month: June Day of Week: Correct Memory: Appears intact Immediate Memory Recall: Sock;Blue;Bed Memory Recall: Sock;Blue;Bed Memory Recall Sock: Without Cue Memory Recall Blue: Without Cue Memory Recall Bed: Without Cue Attention: Selective Selective Attention: Appears intact Awareness: Appears intact Problem Solving: Appears intact Safety/Judgment: Appears intact Sensation Sensation Light Touch: Appears Intact Coordination Gross Motor Movements are Fluid and Coordinated: No Fine Motor Movements are Fluid and Coordinated: Yes Coordination and Movement Description: Gross motor movement limited 2/2 pain Finger Nose Finger Test: [facility] Motor  Motor Motor: Other (comment)((Unable to fully assess 2/2 pain, generalized weakness ) Mobility  Bed Mobility Bed Mobility: Not assessed(Pt unable to participate 2/2 pain)  Trunk/Postural Assessment  Cervical Assessment Cervical Assessment: ((unable to assess fully 2/2 pain)) Thoracic Assessment Thoracic Assessment: ((unable to assess fully 2/2 pain)) Lumbar Assessment Lumbar Assessment: ((unable to assess fully 2/2 pain)) Postural Control Postural Control: ((unable to assess fully 2/2 pain))  Balance Balance Balance Assessed: No(unable to assess fully 2/2 pain) Dynamic Sitting Balance Sitting balance - Comments: Pt refused to come EOB  Extremity/Trunk Assessment RUE Assessment RUE Assessment: Exceptions to [facility] General Strength Comments: 4/5 overall  LUE Assessment LUE Assessment: Exceptions to [facility] General Strength Comments: 4/5 overall   See Function Navigator for Current Functional Status.   Refer to Care Plan for Long Term Goals  Recommendations for other services: Therapeutic Recreation  Stress management and Outing/community  reintegration   Discharge Criteria: Patient will be discharged from OT if patient refuses treatment 3 consecutive times without medical reason, if treatment goals not met, if there is a change in medical status, if patient makes no progress  towards goals or if patient is discharged from hospital.  The above assessment, treatment plan, treatment alternatives and goals were discussed and mutually agreed upon: by patient  Curtis Sites 05/23/2018, 3:17 PM

## 2018-05-23 NOTE — Progress Notes (Signed)
Subjective/Complaints:   Objective: Vital Signs: Blood pressure (!) 155/82, pulse 96, temperature 98.2 F (36.8 C), temperature source Oral, resp. rate 16, height 5' 5"  (1.651 m), weight 103.1 kg (227 lb 6.4 oz), SpO2 100 %. No results found. Results for orders placed or performed during the hospital encounter of 05/22/18 (from the past 72 hour(s))  Glucose, capillary     Status: Abnormal   Collection Time: 05/22/18  3:19 PM  Result Value Ref Range   Glucose-Capillary 170 (H) 65 - 99 mg/dL  Glucose, capillary     Status: Abnormal   Collection Time: 05/22/18  5:23 PM  Result Value Ref Range   Glucose-Capillary 152 (H) 65 - 99 mg/dL  Glucose, capillary     Status: Abnormal   Collection Time: 05/22/18  9:40 PM  Result Value Ref Range   Glucose-Capillary 200 (H) 65 - 99 mg/dL  Comprehensive metabolic panel     Status: Abnormal   Collection Time: 05/23/18  4:03 AM  Result Value Ref Range   Sodium 133 (L) 135 - 145 mmol/L   Potassium 4.9 3.5 - 5.1 mmol/L   Chloride 104 101 - 111 mmol/L   CO2 21 (L) 22 - 32 mmol/L   Glucose, Bld 99 65 - 99 mg/dL   BUN 48 (H) 6 - 20 mg/dL   Creatinine, Ser 1.62 (H) 0.44 - 1.00 mg/dL   Calcium 8.9 8.9 - 10.3 mg/dL   Total Protein 5.2 (L) 6.5 - 8.1 g/dL   Albumin 2.3 (L) 3.5 - 5.0 g/dL   AST 29 15 - 41 U/L   ALT 26 14 - 54 U/L   Alkaline Phosphatase 53 38 - 126 U/L   Total Bilirubin 0.4 0.3 - 1.2 mg/dL   GFR calc non Af Amer 30 (L) >60 mL/min   GFR calc Af Amer 35 (L) >60 mL/min    Comment: (NOTE) The eGFR has been calculated using the CKD EPI equation. This calculation has not been validated in all clinical situations. eGFR's persistently <60 mL/min signify possible Chronic Kidney Disease.    Anion gap 8 5 - 15    Comment: Performed at [facility] [address] [place], [place] [address]  CBC WITH DIFFERENTIAL     Status: Abnormal   Collection Time: 05/23/18  4:03 AM  Result Value Ref Range   WBC 8.2 4.0 - 10.5 K/uL   RBC 3.23  (L) 3.87 - 5.11 MIL/uL   Hemoglobin 9.6 (L) 12.0 - 15.0 g/dL   HCT 29.2 (L) 36.0 - 46.0 %   MCV 90.4 78.0 - 100.0 fL   MCH 29.7 26.0 - 34.0 pg   MCHC 32.9 30.0 - 36.0 g/dL   RDW 16.3 (H) 11.5 - 15.5 %   Platelets 154 150 - 400 K/uL   Neutrophils Relative % 79 %   Neutro Abs 6.6 1.7 - 7.7 K/uL   Lymphocytes Relative 8 %   Lymphs Abs 0.6 (L) 0.7 - 4.0 K/uL   Monocytes Relative 9 %   Monocytes Absolute 0.7 0.1 - 1.0 K/uL   Eosinophils Relative 3 %   Eosinophils Absolute 0.2 0.0 - 0.7 K/uL   Basophils Relative 0 %   Basophils Absolute 0.0 0.0 - 0.1 K/uL   Immature Granulocytes 1 %   Abs Immature Granulocytes 0.1 0.0 - 0.1 K/uL    Comment: Performed at [facility], [place] [address], [place], [place] [address]  Glucose, capillary     Status: None   Collection  Time: 05/23/18  6:52 AM  Result Value Ref Range   Glucose-Capillary 93 65 - 99 mg/dL     HEENT: normal Cardio: RRR and no murmur Resp: CTA B/L and unlabored GI: BS positive and NT, ND Extremity:  Pulses positive and No Edema Skin:   Intact Neuro: Alert/Oriented, Normal Sensory, Abnormal Motor 3- RIgh tHF, KE, 4/5 Right ADF, 4- Left HF, KE 5/5 Left ankle DF and Reflexes: 0 Musc/Skel:  Other no pain with RIght hip or knee ROM no knee effusion Gen NAD   Assessment/Plan: 1. Functional deficits secondary to L4-5 Lumbar discitis which require 3+ hours per day of interdisciplinary therapy in a comprehensive inpatient rehab setting. Physiatrist is providing close team supervision and 24 hour management of active medical problems listed below. Physiatrist and rehab team continue to assess barriers to discharge/monitor patient progress toward functional and medical goals. FIM:                   Function - Comprehension Comprehension: Auditory Comprehension assist level: Follows complex conversation/direction with extra time/assistive device  Function - Expression Expression: Verbal Expression assist level:  Expresses complex ideas: With no assist  Function - Social Interaction Social Interaction assist level: Interacts appropriately with others - No medications needed.  Function - Problem Solving Problem solving assist level: Solves complex problems: Recognizes & self-corrects  Function - Memory Memory assist level: Complete Independence: No helper  Medical Problem List and Plan:  1. Functional deficits secondary to L4-5 osteomyelitis/discitis, lumbar spondylosis  -CIR PT, OT 2. DVT Prophylaxis/Anticoagulation: Mechanical: Sequential compression devices, below knee Bilateral lower extremities  3. Pain Management: Will schedule oxycodone prior to therapy to help with activity tolerance. Add ultram prn for moderate pain. Monitor and titrate medications to help with neuropathic symptoms. Decadron d/c today.  C/o RLE radicular pain, will increase gabapentin to 661m BID, consider another course of steroids 4. Mood: LCSW to follow for evaluation and support.  5. Neuropsych: This patient is capable of making decisions on her own behalf.  6. Skin/Wound Care: routine pressure relief measures  7. Fluids/Electrolytes/Nutrition: Monitor I/O. Check lytes in am.  8. Lumbar diskitis: Afebrile on scheduled tylenol. On Daptomycin and ceftriaxone.  9. Acute on chronic anemia: Treated with iron and procrit on outpatient basis. Baseline Hgb- 9-10 range? Will continue to monitor serially  Hgb stable at 9.6 10. Symptomatic Bradycardia s/p PPM: Monitor HR bid.  11. GIB with thrombocytopenia: Continue to monitor H/H serially for stability. Platelets trending down--monitor for signs of rebleeding.  plt [id number] 12. New diagnosis T2DM: Hgb A1c- 6.3. Poorly controlled due to recent rounds of steroids--Lantus was titrated upwards for better control. Was not on any medications at home. Monitor BS ac/hs--watch for hypoglycemic episodes as steroids tapered off.  CBG (last 3)  Recent Labs    05/22/18 1723 05/22/18 2140  05/23/18 0652  GLUCAP 152* 200* 93  Good control 6/22 13. Neuropathy BLE: Continue Gabapentin bid--monitor for SE.  14. Hyponatremia: Question due to intake. Will add protein supplements with meals. monitor for now.  15. Stage IV CKD: Baseline SCr 3.5-4.0. Monitor for SE/asterixis with narcotics on board. Renal status improving.  16. Fluid overload: Monitor daily weights and for signs of overload--appears to be mildly overloaded. Resume lasix at lower dose--was on 80 mg bid PTA.    LOS (Days) 1 A FACE TO FACE EVALUATION WAS PERFORMED  ACharlett Blake6/22/2019, 8:38 AM

## 2018-05-23 NOTE — Evaluation (Signed)
Physical Therapy Assessment and Plan  Patient Details  Name: Erika Fry MRN: [id number] Date of Birth: 1946/11/27  PT Diagnosis: Difficulty walking, Low back pain, Muscle spasms, Muscle weakness and Pain in R hip Rehab Potential: Fair ELOS: 24-28 days    Today's Date: 05/23/2018 PT Individual Time: 0800-0840 PT Individual Time Calculation (min): 40 min   PT Missed Time Calculation (min): 20 min (pain)   Problem List:  Patient Active Problem List   Diagnosis Date Noted  . Diskitis 05/22/2018  . Occult blood in stools   . Gastric polyp   . Lumbar discitis 05/17/2018  . Blood loss anemia 05/12/2018  . Hyperlipidemia associated with type 2 diabetes mellitus ([place]) 05/10/2018  . Essential hypertension 04/29/2018  . CKD (chronic kidney disease), stage IV ([place])   . Diet-controlled diabetes mellitus ([place])   . GERD (gastroesophageal reflux disease)   . Gout   . High cholesterol   . Hypothyroidism   . Iron deficiency anemia   . AV block, Mobitz 2 03/25/2018  . Mobitz type 2 second degree AV block 03/20/2018  . Aortic stenosis 03/20/2018  . Exertional dyspnea 03/19/2018    Past Medical History:  Past Medical History:  Diagnosis Date  . Arthritis    "knees, thumbs" (03/25/2018)  . CKD (chronic kidney disease), stage IV ([place])   . Diet-controlled diabetes mellitus ([place])   . GERD (gastroesophageal reflux disease)   . Gout    "on daily RX" (03/25/2018)  . Heart murmur   . High cholesterol   . Hypertension   . Hypothyroidism   . Iron deficiency anemia    "had to get an iron infusion"  . Migraine    "used to have them growing up" (03/25/2018)  . Presence of permanent cardiac pacemaker 03/25/2018   Past Surgical History:  Past Surgical History:  Procedure Laterality Date  . APPENDECTOMY    . BIOPSY  05/19/2018   Procedure: BIOPSY;  Surgeon: Ladene Artist, MD;  Location: [facility] ENDOSCOPY;  Service: Endoscopy;;  . ESOPHAGOGASTRODUODENOSCOPY N/A 05/19/2018   Procedure:  ESOPHAGOGASTRODUODENOSCOPY (EGD);  Surgeon: Ladene Artist, MD;  [facility];  Service: Endoscopy;  Laterality: N/A;  . INSERT / REPLACE / REMOVE PACEMAKER  03/25/2018  . IR FLUORO GUIDE CV LINE RIGHT  05/18/2018  . IR LUMBAR DISC ASPIRATION W/IMG GUIDE  05/15/2018  . IR US GUIDE VASC ACCESS RIGHT  05/18/2018  . KNEE ARTHROSCOPY Bilateral   . PACEMAKER IMPLANT N/A 03/25/2018   Procedure: PACEMAKER IMPLANT;  Surgeon: Evans Lance, MD;  Location: [facility] CV LAB;  Service: Cardiovascular;  Laterality: N/A;  . PACEMAKER PLACEMENT Left 03/2018  . RIGHT HEART CATH N/A 03/20/2018   Procedure: RIGHT HEART CATH;  Surgeon: Nigel Mormon, MD;  Location: [facility] CV LAB;  Service: Cardiovascular;  Laterality: N/A;  . TEE WITHOUT CARDIOVERSION N/A 03/20/2018   Procedure: TRANSESOPHAGEAL ECHOCARDIOGRAM (TEE);  Surgeon: Nigel Mormon, MD;  Location: [facility] ENDOSCOPY;  Service: Cardiovascular;  Laterality: N/A;  . TONSILLECTOMY      Assessment & Plan Clinical Impression: Patient is a 72 year old female with history of HTN, CKD, anemia of chronic disease, T2DM, PPM 4/19, recent admission for fall due to UTI with back pain (no MRI due to recent PPM) treated with steroids and she was discharged to SNF on 05/05/18. She was readmitted on 05/12/18 with constipation, hypotension and acute on chronic anemia with Hgb 6.9. She was treated with fluid boluses and started on PPI. MRI of  spine done revealing acute osteomyelitis of L4/5 with paraspinous edema within the right psoas muscle, paraspinous fluid collection as well as moderate to severe right and moderate left foraminal stenosis. GI was consulted for input and recommended supportive care to the medical work-up is complete. She underwent aspiration of fluid collection from her back and cultures are negative. ID was consulted for input and recommended at least 6 weeks of IV ceftriaxone and renally dosed daptomycin--end date July 24th.  She has  been transfused with 6 units packed red blood cells during her stay. She was treated with decadron for pain control and this was d/c today. Renal status has improved off furosemide. She has had episodes of heme positive stools and underwent EGD to revealing single friable gastric polyp pylorus polyp that was biopsied by Dr. Fuller Plan. She is to follow-up on outpatient basis for colonoscopy and follow-up EGD. Therapy ongoing and patient continues to be limited by pain neuropathy bilateral lower extremity as well as fatigue. CIR was requested due to functional decline.  Patient transferred to CIR on 05/22/2018 .   Patient currently requires unknown level of assist with mobility secondary to muscle weakness and muscle joint tightness, decreased cardiorespiratoy endurance and decreased sitting balance, decreased standing balance, decreased postural control and decreased balance strategies.  Per chart review, pt requiring max assist x2 to transfer supine<>sit and bed<>chair prior to admission to CIR. Unable to determine CLOF 2/2 pt's pain level and refusal to attempt OOB activity. Prior to hospitalization, patient was independent  with mobility and lived with Alone in a House home.  Home access is 2Stairs to enter.  Patient will benefit from skilled PT intervention to maximize safe functional mobility, minimize fall risk and decrease caregiver burden for planned discharge home with 24 hour assist.  Anticipate patient will benefit from follow up [facility] at discharge.  PT - End of Session Activity Tolerance: Tolerates < 10 min activity, no significant change in vital signs Endurance Deficit: Yes Endurance Deficit Description: decreased, increased work of breathing 2/2 pain and w/ any active movement PT Assessment Rehab Potential (ACUTE/IP ONLY): Fair PT Barriers to Discharge: Inaccessible home environment PT Barriers to Discharge Comments: 2 steps to enter home PT Patient demonstrates impairments in the following  area(s): Endurance;Motor;Pain;Safety;Balance PT Transfers Functional Problem(s): Bed Mobility;Bed to Chair;Car;Furniture;Floor PT Locomotion Functional Problem(s): Stairs;Wheelchair Mobility;Ambulation PT Plan PT Intensity: Minimum of 1-2 x/day ,45 to 90 minutes PT Frequency: 5 out of 7 days PT Duration Estimated Length of Stay: 24-28 days  PT Treatment/Interventions: Ambulation/gait training;Disease management/prevention;Pain management;Stair training;Visual/perceptual remediation/compensation;Wheelchair propulsion/positioning;Therapeutic Activities;Patient/family education;DME/adaptive equipment instruction;Balance/vestibular training;Cognitive remediation/compensation;Psychosocial support;Therapeutic Exercise;Skin care/wound management;UE/LE Strength taining/ROM;Functional mobility training;Community reintegration;Discharge planning;Neuromuscular re-education;Splinting/orthotics;UE/LE Coordination activities PT Transfers Anticipated Outcome(s): Min assist PT Locomotion Anticipated Outcome(s): Min assist short distance gait PT Recommendation Recommendations for Other Services: Neuropsych consult Follow Up Recommendations: Home health PT Patient destination: Home Equipment Recommended: To be determined  Skilled Therapeutic Intervention  Pt in supine and appearing distressed and in pain. Pt immediately stated she could not participate in therapy today 2/2 pain. She states she was up most of the night w/ back spasms radiating into R hip and RLE, which is new for her since this hospital admission. Pain is 10/10 and she performed pursed lip breathing throughout session for pain management and intermittently yelled out in pain. Therapist made skilled attempt at repositioning pt in bed w/ pillows and hospital bed functions for pain relief on back, however pain did not improve by end of session but pt agreeable to  see if staying in position for a period of time will help. RN present providing medication  and aware of pain level. Performed skilled evaluation from bed level as detailed below and instructed pt on bed level exercises including ankle pumps, glut sets, and heel slides to work on gentle and pain-free LE ROM. Pt performed correctly and within pain tolerable range. Pt educated patient in [place], rehab potential, rehab goals, and discharge recommendations based on today's evaluation w/ caveat that it could change as her pain level is managed. Pt verbalized understanding and in agreement. Ended session in supine, call bell within reach and all needs met. Missed 20 min of skilled PT 2/2 pain/refusal.   PT Evaluation Precautions/Restrictions Precautions Precautions: Fall Precaution Comments: Pacemaker placement 4 weeks ago Required Braces or Orthoses: Spinal Brace(LSO for comfort) Restrictions Weight Bearing Restrictions: No General PT Amount of Missed Time (min): 20 Minutes PT Missed Treatment Reason: Pain Vital SignsTherapy Vitals Temp: 98.2 F (36.8 C) Temp Source: Oral Pulse Rate: 96 Resp: 16 BP: (!) 155/82 Patient Position (if appropriate): Sitting Oxygen Therapy SpO2: 100 % O2 Device: Room Air Pain Pain Assessment Pain Scale: 0-10 Pain Score: 10-Worst pain ever Pain Location: Leg Pain Orientation: Right(right leg) Pain Descriptors / Indicators: Constant Pain Onset: On-going Pain Intervention(s): Medication (See eMAR) Home Living/Prior Functioning Home Living Available Help at Discharge: Family;Available 24 hours/day(Amber (daughter-in-law) ) Type of Home: House Home Access: Stairs to enter [employer] of Steps: 2 Entrance Stairs-Rails: None Home Layout: Two level;Able to live on main level with bedroom/bathroom Bathroom Shower/Tub: Multimedia programmer: Handicapped height  Lives With: Alone Prior Function Level of Independence: Independent with basic ADLs;Independent with transfers;Independent with homemaking with ambulation;Independent with  gait(Prior to fall a few weeks ago, then d/c to SNF as she "couldn't walk" and was "bed bound")  Able to Take Stairs?: Yes Driving: Yes Vocation: Retired Art gallery manager: Within Advertising copywriter Praxis Praxis: Intact  Cognition Overall Cognitive Status: Within Functional Limits for tasks assessed Arousal/Alertness: Awake/alert Orientation Level: Oriented X4 Memory: Appears intact Awareness: Appears intact Problem Solving: Appears intact Safety/Judgment: Appears intact Sensation Sensation Light Touch: Appears Intact Coordination Gross Motor Movements are Fluid and Coordinated: No Fine Motor Movements are Fluid and Coordinated: Yes Coordination and Movement Description: Gross motor movements limited 2/2 pain Locomotion  Gait Ambulation: No Gait Gait: No Stairs / Additional Locomotion Stairs: No Wheelchair Mobility Wheelchair Mobility: No  Extremity Assessment  RLE Assessment RLE Assessment: Exceptions to [facility] Passive Range of Motion (PROM) Comments: hip flexion limited to 90 deg 2/2 pain, otherwise WFL Active Range of Motion (AROM) Comments: hip motion limited ~50% 2/2 pain, otherwise [facility] General Strength Comments: unable to formally assess 2/2 pain, pt able to move extremity against gravity w/o assist  LLE Assessment LLE Assessment: Exceptions to [facility] Passive Range of Motion (PROM) Comments: WFL Active Range of Motion (AROM) Comments: hip motion limited ~50% 2/2 pain, otherwise [facility] General Strength Comments: unable to formally assess 2/2 pain, pt able to move extremity against gravity w/o assist   See Function Navigator for Current Functional Status.   Refer to Care Plan for Long Term Goals  Recommendations for other services: Neuropsych  Discharge Criteria: Patient will be discharged from PT if patient refuses treatment 3 consecutive times without medical reason, if treatment goals not met, if there is a change in medical status, if patient  makes no progress towards goals or if patient is discharged from hospital.  The above assessment, treatment plan, treatment alternatives  and goals were discussed and mutually agreed upon: by patient  Ayra Hodgdon K Arnette 05/23/2018, 8:49 AM

## 2018-05-24 ENCOUNTER — Inpatient Hospital Stay (HOSPITAL_COMMUNITY): Payer: Medicare Other | Admitting: Occupational Therapy

## 2018-05-24 ENCOUNTER — Inpatient Hospital Stay (HOSPITAL_COMMUNITY): Payer: Medicare Other

## 2018-05-24 DIAGNOSIS — R609 Edema, unspecified: Secondary | ICD-10-CM

## 2018-05-24 LAB — GLUCOSE, CAPILLARY
GLUCOSE-CAPILLARY: 138 mg/dL — AB (ref 65–99)
GLUCOSE-CAPILLARY: 209 mg/dL — AB (ref 65–99)
GLUCOSE-CAPILLARY: 291 mg/dL — AB (ref 65–99)
Glucose-Capillary: 65 mg/dL (ref 65–99)

## 2018-05-24 MED ORDER — DEXAMETHASONE 2 MG PO TABS
1.0000 mg | ORAL_TABLET | Freq: Two times a day (BID) | ORAL | Status: DC
  Administered 2018-05-24 – 2018-05-27 (×7): 1 mg via ORAL
  Filled 2018-05-24 (×7): qty 1

## 2018-05-24 NOTE — Progress Notes (Signed)
Subjective/Complaints: Right LE pain seems to come from back, somewhat better this am but starting to notice tremor R>L arm, also very tired   ROS- RIght hip pain, no CP, SOB, Neg N/V/D Objective: Vital Signs: Blood pressure (!) 158/85, pulse 94, temperature 100.2 F (37.9 C), temperature source Oral, resp. rate 19, height _0  (1.651 m), weight 103.1 kg (227 lb 6.4 oz), SpO2 98 %. No results found. Results for orders placed or performed during the hospital encounter of 05/22/18 (from the past 72 hour(s))  Glucose, capillary     Status: Abnormal   Collection Time: 05/22/18  3:19 PM  Result Value Ref Range   Glucose-Capillary 170 (H) 65 - 99 mg/dL  Glucose, capillary     Status: Abnormal   Collection Time: 05/22/18  5:23 PM  Result Value Ref Range   Glucose-Capillary 152 (H) 65 - 99 mg/dL  Glucose, capillary     Status: Abnormal   Collection Time: 05/22/18  9:40 PM  Result Value Ref Range   Glucose-Capillary 200 (H) 65 - 99 mg/dL  Comprehensive metabolic panel     Status: Abnormal   Collection Time: 05/23/18  4:03 AM  Result Value Ref Range   Sodium 133 (L) 135 - 145 mmol/L   Potassium 4.9 3.5 - 5.1 mmol/L   Chloride 104 101 - 111 mmol/L   CO2 21 (L) 22 - 32 mmol/L   Glucose, Bld 99 65 - 99 mg/dL   BUN 48 (H) 6 - 20 mg/dL   Creatinine, Ser 1.62 (H) 0.44 - 1.00 mg/dL   Calcium 8.9 8.9 - 10.3 mg/dL   Total Protein 5.2 (L) 6.5 - 8.1 g/dL   Albumin 2.3 (L) 3.5 - 5.0 g/dL   AST 29 15 - 41 U/L   ALT 26 14 - 54 U/L   Alkaline Phosphatase 53 38 - 126 U/L   Total Bilirubin 0.4 0.3 - 1.2 mg/dL   GFR calc non Af Amer 30 (L) >60 mL/min   GFR calc Af Amer 35 (L) >60 mL/min    Comment: (NOTE) The eGFR has been calculated using the CKD EPI equation. This calculation has not been validated in all clinical situations. eGFR's persistently <60 mL/min signify possible Chronic Kidney Disease.    Anion gap 8 5 - 15    Comment: Performed at [facility] [address],  [place], [place] [address]  CBC WITH DIFFERENTIAL     Status: Abnormal   Collection Time: 05/23/18  4:03 AM  Result Value Ref Range   WBC 8.2 4.0 - 10.5 K/uL   RBC 3.23 (L) 3.87 - 5.11 MIL/uL   Hemoglobin 9.6 (L) 12.0 - 15.0 g/dL   HCT 29.2 (L) 36.0 - 46.0 %   MCV 90.4 78.0 - 100.0 fL   MCH 29.7 26.0 - 34.0 pg   MCHC 32.9 30.0 - 36.0 g/dL   RDW 16.3 (H) 11.5 - 15.5 %   Platelets 154 150 - 400 K/uL   Neutrophils Relative % 79 %   Neutro Abs 6.6 1.7 - 7.7 K/uL   Lymphocytes Relative 8 %   Lymphs Abs 0.6 (L) 0.7 - 4.0 K/uL   Monocytes Relative 9 %   Monocytes Absolute 0.7 0.1 - 1.0 K/uL   Eosinophils Relative 3 %   Eosinophils Absolute 0.2 0.0 - 0.7 K/uL   Basophils Relative 0 %   Basophils Absolute 0.0 0.0 - 0.1 K/uL   Immature Granulocytes 1 %   Abs Immature Granulocytes 0.1 0.0 -  0.1 K/uL    Comment: Performed at [facility], [place] [address], [place], [place] [address]  Glucose, capillary     Status: None   Collection Time: 05/23/18  6:52 AM  Result Value Ref Range   Glucose-Capillary 93 65 - 99 mg/dL  Glucose, capillary     Status: None   Collection Time: 05/23/18  4:47 PM  Result Value Ref Range   Glucose-Capillary 85 65 - 99 mg/dL  Glucose, capillary     Status: None   Collection Time: 05/23/18  9:39 PM  Result Value Ref Range   Glucose-Capillary 91 65 - 99 mg/dL  Glucose, capillary     Status: None   Collection Time: 05/24/18  6:22 AM  Result Value Ref Range   Glucose-Capillary 65 65 - 99 mg/dL     HEENT: normal Cardio: RRR and no murmur Resp: CTA B/L and unlabored GI: BS positive and NT, ND Extremity:  Pulses positive and No Edema Skin:   Intact Neuro: Alert/Oriented, Normal Sensory, Abnormal Motor 3- RIgh tHF, KE, 4/5 Right ADF, 4- Left HF, KE 5/5 Left ankle DF and Reflexes: 0 Musc/Skel:  Other no pain with RIght hip or knee ROM no knee effusion Gen NAD   Assessment/Plan: 1. Functional deficits secondary to L4-5 Lumbar discitis which require 3+ hours per  day of interdisciplinary therapy in a comprehensive inpatient rehab setting. Physiatrist is providing close team supervision and 24 hour management of active medical problems listed below. Physiatrist and rehab team continue to assess barriers to discharge/monitor patient progress toward functional and medical goals. FIM:             Function - Chair/bed transfer Chair/bed transfer activity did not occur: Refused  Function - Locomotion: Oceanographer activity did not occur: Refused Wheel 50 feet with 2 turns activity did not occur: Refused Wheel 150 feet activity did not occur: Refused Function - Locomotion: Ambulation Ambulation activity did not occur: Safety/medical concerns Walk 10 feet activity did not occur: Safety/medical concerns Walk 50 feet with 2 turns activity did not occur: Safety/medical concerns Walk 150 feet activity did not occur: Safety/medical concerns Walk 10 feet on uneven surfaces activity did not occur: Safety/medical concerns  Function - Comprehension Comprehension: Auditory Comprehension assist level: Follows complex conversation/direction with no assist  Function - Expression Expression: Verbal Expression assist level: Expresses complex ideas: With no assist  Function - Social Interaction Social Interaction assist level: Interacts appropriately with others - No medications needed.  Function - Problem Solving Problem solving assist level: Solves complex problems: Recognizes & self-corrects  Function - Memory Memory assist level: Complete Independence: No helper Patient normally able to recall (first 3 days only): Current season, Staff names and faces, That he or she is in a hospital  Medical Problem List and Plan:  1. Functional deficits secondary to L4-5 osteomyelitis/discitis, lumbar spondylosis  -CIR PT, OT 2. DVT Prophylaxis/Anticoagulation: Mechanical: Sequential compression devices, below knee Bilateral lower extremities  3. Pain  Management: Will schedule oxycodone prior to therapy to help with activity tolerance. Add ultram prn for moderate pain. Monitor and titrate medications to help with neuropathic symptoms. Decadron d/c today.  C/o RLE radicular pain, partially improved on gabapentin to 626m BID,but now tired with tremors, will reduce to 4070m add low dose dexa for several days 4. Mood: LCSW to follow for evaluation and support.  5. Neuropsych: This patient is capable of making decisions on her own behalf.  6. Skin/Wound Care: routine pressure relief measures  7. Fluids/Electrolytes/Nutrition: Monitor I/O. Check lytes in am.  8. Lumbar diskitis: Afebrile on scheduled tylenol. On Daptomycin and ceftriaxone. Discussed that steroids may make it more difficult to fight infx, pt very uncomfortable and will restart low dose for limited time 9. Acute on chronic anemia: Treated with iron and procrit on outpatient basis. Baseline Hgb- 9-10 range? Will continue to monitor serially  Hgb stable at 9.6 10. Symptomatic Bradycardia s/p PPM: Monitor HR bid.  11. GIB with thrombocytopenia: Continue to monitor H/H serially for stability. Platelets trending down--monitor for signs of rebleeding.  plt [id number] 12. New diagnosis T2DM: Hgb A1c- 6.3. Poorly controlled due to recent rounds of steroids--Lantus was titrated upwards for better control. Was not on any medications at home. Monitor BS ac/hs--watch for hypoglycemic episodes as steroids tapered off.  CBG (last 3)  Recent Labs    05/23/18 1647 05/23/18 2139 05/24/18 0622  GLUCAP 85 91 65  Good control 6/23- may go up again with dexa 13. Neuropathy BLE: Continue Gabapentin bid--monitor for SE.  14. Hyponatremia: Question due to intake. Will add protein supplements with meals. monitor for now.  15. Stage IV CKD: Baseline SCr 3.5-4.0. Monitor for SE/asterixis with narcotics on board. Renal status improving.  16. Fluid overload: Monitor daily weights and for signs of  overload--appears to be mildly overloaded. Resume lasix at lower dose--was on 80 mg bid PTA.    LOS (Days) 2 A FACE TO FACE EVALUATION WAS PERFORMED  Charlett Blake 05/24/2018, 8:01 AM

## 2018-05-24 NOTE — IPOC Note (Signed)
Overall Plan of Care [facility]) Patient Details Name: Erika Fry MRN: [id number] DOB: 1946-05-19  Admitting Diagnosis: Diskitis  Hospital Problems: Principal Problem:   Diskitis Active Problems:   CKD (chronic kidney disease), stage IV (HCC)   Iron deficiency anemia   Essential hypertension   Gastric polyp   Acute right-sided low back pain with right-sided sciatica   Radicular pain   Acute blood loss anemia   Anemia of chronic disease   Thrombocytopenia ([place])   New onset type 2 diabetes mellitus ([place])   Morbid obesity (HCC)   Hyponatremia   Benign essential HTN   Hypoalbuminemia due to protein-calorie malnutrition ([place])     Functional Problem List: Nursing Bladder, Edema, Endurance, Medication Management, Motor, Pain, Skin Integrity  PT Endurance, Motor, Pain, Safety, Balance  OT Endurance, Pain, Motor, Balance  SLP    TR         Basic ADL's: OT Toileting, Grooming, Bathing, Dressing     Advanced  ADL's: OT       Transfers: PT Bed Mobility, Bed to Chair, Car, [employer], Floor  OT Toilet, Tub/Shower     Locomotion: PT Stairs, Emergency planning/management officer, Ambulation     Additional Impairments: OT    SLP        TR      Anticipated Outcomes Item Anticipated Outcome  Self Feeding Mod I  Swallowing      Basic self-care  Min A  Toileting  Min A   Bathroom Transfers Min A  Bowel/Bladder  remain continent of bowel and bladder.  Transfers  Min assist  Locomotion  Min assist short distance gait  Communication     Cognition     Pain  less than 4  Safety/Judgment  No falls, skin breakdown and infection   Therapy Plan: PT Intensity: Minimum of 1-2 x/day ,45 to 90 minutes PT Frequency: 5 out of 7 days PT Duration Estimated Length of Stay: 24-28 days  OT Intensity: Minimum of 1-2 x/day, 45 to 90 minutes OT Frequency: 5 out of 7 days OT Duration/Estimated Length of Stay: 24-28 days      Team Interventions: Nursing Interventions Patient/Family  Education, Skin Care/Wound Management, Pain Management, Bladder Management, Discharge Planning  PT interventions Ambulation/gait training, Disease management/prevention, Pain management, Stair training, Visual/perceptual remediation/compensation, Wheelchair propulsion/positioning, Therapeutic Activities, Patient/family education, DME/adaptive equipment instruction, Training and development officer, Cognitive remediation/compensation, Psychosocial support, Therapeutic Exercise, Skin care/wound management, UE/LE Strength taining/ROM, Functional mobility training, Community reintegration, Discharge planning, Neuromuscular re-education, Splinting/orthotics, UE/LE Coordination activities  OT Interventions Training and development officer, Discharge planning, DME/adaptive equipment instruction, Functional mobility training, Neuromuscular re-education, Pain management, Patient/family education, Psychosocial support, Self Care/advanced ADL retraining, Therapeutic Exercise, Therapeutic Activities, UE/LE Strength taining/ROM, UE/LE Coordination activities, Wheelchair propulsion/positioning, Community reintegration  SLP Interventions    TR Interventions    SW/CM Interventions Discharge Planning, Psychosocial Support, Patient/Family Education   Barriers to Discharge MD  Medical stability and IV antibiotics  Nursing Decreased caregiver support, Weight    PT Inaccessible home environment 2 steps to enter home  OT      SLP      SW       Team Discharge Planning: Destination: PT-Home ,OT- Home , SLP-  Projected Follow-up: PT-Home health PT, OT-  Home health OT, SLP-  Projected Equipment Needs: PT-To be determined, OT- To be determined, SLP-  Equipment Details: PT- , OT-  Patient/family involved in discharge planning: PT- Patient,  OT- , SLP-   MD ELOS: 26-29 days. Medical Rehab Prognosis:  Good Assessment: 72  year old female with history of HTN, CKD, anemia of chronic disease, T2DM, PPM 4/19, recent admission for  fall due to UTI with back pain (no MRI due to recent PPM) treated with steroids and she was discharged to SNF on 05/05/18. She was readmitted on 05/12/18 with constipation, hypotension and acute on chronic anemia with Hgb 6.9. She was treated with fluid boluses and started on PPI. MRI of spine done revealing acute osteomyelitis of L4/5 with paraspinous edema within the right psoas muscle, paraspinous fluid collection as well as moderate to severe right and moderate left foraminal stenosis. GI was consulted for input and recommended supportive care to the medical work-up is complete. She underwent aspiration of fluid collection from her back and cultures are negative. ID was consulted for input and recommended at least 6 weeks of IV ceftriaxone and renally dosed daptomycin--end date July 24th. She was been transfused with 6 units packed red blood cells during her stay. She was treated with decadron for pain control and this was d/c today. Renal status has improved off furosemide. She has had episodes of heme positive stools and underwent EGD to revealing single friable gastric polyp pylorus polyp that was biopsied by Dr. Fuller Plan. She is to follow-up on outpatient basis for colonoscopy and follow-up EGD. Patient with ongoing functional deficits with mobility, endurance, self-care.  Will set goals for Min A/Mod A with PT/OT.    See Team Conference Notes for weekly updates to the plan of care

## 2018-05-24 NOTE — Progress Notes (Signed)
Occupational Therapy Session Note  Patient Details  Name: Erika Fry MRN: [id number] Date of Birth: 1946-06-25  Today's Date: 05/24/2018 OT Individual Time: 1856-3149 OT Individual Time Calculation (min): 27 min   Short Term Goals: Week 1:  OT Short Term Goal 1 (Week 1): Pt will report no more than 5/10 pain in order to participate more functionally in ADLs OT Short Term Goal 2 (Week 1): Pt will sit EOB for >10 min with no more than mod A to participate in morning ADL routine OT Short Term Goal 3 (Week 1): Pt will don shirt with min A  OT Short Term Goal 4 (Week 1): Pt will adhere to back precautions during bed mobility with no more than 2 vc   Skilled Therapeutic Interventions/Progress Updates:    Pt greeted supine in bed, reporting a great deal of pain and "terrified to move" because pain becomes excruciating. RN already aware and pt premedicated. Educated pt on use of mindfulness techniques for pain mgt in conjunction with pain medicine at CIR. Guided pt through visualization exercise with focus on deep breathing. At end of session, pt audibly snoring. Left relaxing music playing at low volume in room, and returned when pt was awake later in day. She reported feeling better after resting/relaxing. Continued education regarding importance of deep breathing and maintaining inner calm when moving with therapists/nursing staff. OT set up her room computer so staff can play favorite [place] music for dampening pain perception (after calling friend on phone). Advised pt to have staff set her up with calming ocean/forest sounds to improve quality of sleep at night. Notified RN about this also. Pt left with all needs.   Therapy Documentation Precautions:  Precautions Precautions: Fall Precaution Comments: Pacemaker placement 4 weeks ago Required Braces or Orthoses: Spinal Brace(LSO for comfort) Spinal Brace: Lumbar corset Restrictions Weight Bearing Restrictions: No Vital Signs: Therapy  Vitals Temp: 99.4 F (37.4 C) Temp Source: Oral Pulse Rate: 90 Resp: 16 BP: (!) 156/71 Patient Position (if appropriate): Lying Oxygen Therapy SpO2: 97 % O2 Device: Room Air ADL: ADL ADL Comments: See functional navigator     See Function Navigator for Current Functional Status.   Therapy/Group: Individual Therapy  Fawnda Vitullo A Sherrye Puga 05/24/2018, 3:58 PM

## 2018-05-24 NOTE — Progress Notes (Signed)
Bilateral lower extremity venous duplex has been completed. Negative for DVT.  05/24/18 8:45 AM Carlos Levering RVT

## 2018-05-25 ENCOUNTER — Inpatient Hospital Stay (HOSPITAL_COMMUNITY): Payer: Medicare Other | Admitting: Occupational Therapy

## 2018-05-25 ENCOUNTER — Inpatient Hospital Stay (HOSPITAL_COMMUNITY): Payer: Medicare Other | Admitting: Physical Therapy

## 2018-05-25 DIAGNOSIS — E8809 Other disorders of plasma-protein metabolism, not elsewhere classified: Secondary | ICD-10-CM

## 2018-05-25 DIAGNOSIS — D696 Thrombocytopenia, unspecified: Secondary | ICD-10-CM

## 2018-05-25 DIAGNOSIS — D638 Anemia in other chronic diseases classified elsewhere: Secondary | ICD-10-CM

## 2018-05-25 DIAGNOSIS — M5410 Radiculopathy, site unspecified: Secondary | ICD-10-CM

## 2018-05-25 DIAGNOSIS — E119 Type 2 diabetes mellitus without complications: Secondary | ICD-10-CM

## 2018-05-25 DIAGNOSIS — M5441 Lumbago with sciatica, right side: Secondary | ICD-10-CM

## 2018-05-25 DIAGNOSIS — N189 Chronic kidney disease, unspecified: Secondary | ICD-10-CM

## 2018-05-25 DIAGNOSIS — E46 Unspecified protein-calorie malnutrition: Secondary | ICD-10-CM

## 2018-05-25 DIAGNOSIS — E871 Hypo-osmolality and hyponatremia: Secondary | ICD-10-CM

## 2018-05-25 DIAGNOSIS — I10 Essential (primary) hypertension: Secondary | ICD-10-CM

## 2018-05-25 DIAGNOSIS — D62 Acute posthemorrhagic anemia: Secondary | ICD-10-CM

## 2018-05-25 LAB — GLUCOSE, CAPILLARY
GLUCOSE-CAPILLARY: 128 mg/dL — AB (ref 65–99)
GLUCOSE-CAPILLARY: 257 mg/dL — AB (ref 65–99)
Glucose-Capillary: 198 mg/dL — ABNORMAL HIGH (ref 65–99)
Glucose-Capillary: 176 mg/dL — ABNORMAL HIGH (ref 65–99)
Glucose-Capillary: 182 mg/dL — ABNORMAL HIGH (ref 65–99)

## 2018-05-25 MED ORDER — HYDRALAZINE HCL 50 MG PO TABS
75.0000 mg | ORAL_TABLET | Freq: Three times a day (TID) | ORAL | Status: DC
  Administered 2018-05-25 – 2018-06-22 (×79): 75 mg via ORAL
  Filled 2018-05-25 (×80): qty 1

## 2018-05-25 NOTE — Progress Notes (Signed)
Occupational Therapy Session Note  Patient Details  Name: Erika Fry MRN: [id number] Date of Birth: 1946-01-27  Today's Date: 05/25/2018 OT Individual Time: 0272-5366 OT Individual Time Calculation (min): 42 min  and Today's Date: 05/25/2018 OT Missed Time: 18 Minutes Missed Time Reason: Pain;Patient unwilling/refused to participate without medical reason   Short Term Goals: Week 1:  OT Short Term Goal 1 (Week 1): Pt will report no more than 5/10 pain in order to participate more functionally in ADLs OT Short Term Goal 2 (Week 1): Pt will sit EOB for >10 min with no more than mod A to participate in morning ADL routine OT Short Term Goal 3 (Week 1): Pt will don shirt with min A  OT Short Term Goal 4 (Week 1): Pt will adhere to back precautions during bed mobility with no more than 2 vc   Skilled Therapeutic Interventions/Progress Updates:    Treatment session with focus on relaxation activity and focus on problem solving schedule (pain meds and therapy sessions) to increase participation.  Upon arrival pt in bed and stating "I hope you don't expect me to do anything".  Pt reports 10/10 pain and spasms in RLE.  Pt reports pleased with activity during PT session with getting OOB and w/c propulsion, even reporting staying up in w/c 50 mins before returning to bed via lift.  Pt washed face with setup and engaged in discussion regarding attempting set schedule for therapies to increase participation based around med orders of pain meds with breakfast and lunch.  Discussed a schedule to attempt this week with pt in agreement to attempt.  Pt reports motivated to participate in therapy but that pain and spasms are too great at this time.  Repositioned pt in bed for improved positioning and left with all needs in reach.  Pt missed 18 mins.  Therapy Documentation Precautions:  Precautions Precautions: Fall Precaution Comments: Pacemaker placement 4 weeks ago Required Braces or Orthoses: Spinal  Brace(LSO for comfort) Spinal Brace: Lumbar corset Restrictions Weight Bearing Restrictions: No General: General OT Amount of Missed Time: 18 Minutes Pain: Pain Assessment Pain Scale: 0-10 Pain Score: 10-Worst pain ever Pain Type: Acute pain Pain Location: Back Pain Descriptors / Indicators: Spasm Pain Frequency: Constant Pain Onset: On-going Patients Stated Pain Goal: 2 Pain Intervention(s): Medication (See eMAR)  See Function Navigator for Current Functional Status.   Therapy/Group: Individual Therapy  Simonne Come 05/25/2018, 12:20 PM

## 2018-05-25 NOTE — Progress Notes (Signed)
Social Work Assessment and Plan Patient Details  Name: Erika Fry MRN: [id number] Date of Birth: [birth year]  Today's Date: 05/25/2018  Problem List:  Patient Active Problem List   Diagnosis Date Noted  . Edema of hand   . Acute right-sided low back pain with right-sided sciatica   . Radicular pain   . Acute blood loss anemia   . Anemia of chronic disease   . Thrombocytopenia ([place])   . New onset type 2 diabetes mellitus ([place])   . Morbid obesity ([place])   . Hyponatremia   . Benign essential HTN   . Hypoalbuminemia due to protein-calorie malnutrition ([place])   . Diskitis 05/22/2018  . Occult blood in stools   . Gastric polyp   . Lumbar discitis 05/17/2018  . Blood loss anemia 05/12/2018  . Hyperlipidemia associated with type 2 diabetes mellitus ([place]) 05/10/2018  . Essential hypertension 04/29/2018  . CKD (chronic kidney disease), stage IV ([place])   . Diet-controlled diabetes mellitus ([place])   . GERD (gastroesophageal reflux disease)   . Gout   . High cholesterol   . Hypothyroidism   . Iron deficiency anemia   . AV block, Mobitz 2 03/25/2018  . Mobitz type 2 second degree AV block 03/20/2018  . Aortic stenosis 03/20/2018  . Exertional dyspnea 03/19/2018   Past Medical History:  Past Medical History:  Diagnosis Date  . Arthritis    "knees, thumbs" (03/25/2018)  . CKD (chronic kidney disease), stage IV ([place])   . Diet-controlled diabetes mellitus ([place])   . GERD (gastroesophageal reflux disease)   . Gout    "on daily RX" (03/25/2018)  . Heart murmur   . High cholesterol   . Hypertension   . Hypothyroidism   . Iron deficiency anemia    "had to get an iron infusion"  . Migraine    "used to have them growing up" (03/25/2018)  . Presence of permanent cardiac pacemaker 03/25/2018   Past Surgical History:  Past Surgical History:  Procedure Laterality Date  . APPENDECTOMY    . BIOPSY  05/19/2018   Procedure: BIOPSY;  Surgeon: Ladene Artist, MD;  Location[facility]  ENDOSCOPY;  Service: Endoscopy;;  . ESOPHAGOGASTRODUODENOSCOPY N/A 05/19/2018   Procedure: ESOPHAGOGASTRODUODENOSCOPY (EGD);  Surgeon: Ladene Artist, MD;  Location: [facility] ENDOSCOPY;  Service: Endoscopy;  Laterality: N/A;  . INSERT / REPLACE / REMOVE PACEMAKER  03/25/2018  . IR FLUORO GUIDE CV LINE RIGHT  05/18/2018  . IR LUMBAR DISC ASPIRATION W/IMG GUIDE  05/15/2018  . IR US GUIDE VASC ACCESS RIGHT  05/18/2018  . KNEE ARTHROSCOPY Bilateral   . PACEMAKER IMPLANT N/A 03/25/2018   Procedure: PACEMAKER IMPLANT;  Surgeon: Evans Lance, MD;  Location: [place] CV LAB;  Service: Cardiovascular;  Laterality: N/A;  . PACEMAKER PLACEMENT Left 03/2018  . RIGHT HEART CATH N/A 03/20/2018   Procedure: RIGHT HEART CATH;  Surgeon: Nigel Mormon, MD;  Location: [place] CV LAB;  Service: Cardiovascular;  Laterality: N/A;  . TEE WITHOUT CARDIOVERSION N/A 03/20/2018   Procedure: TRANSESOPHAGEAL ECHOCARDIOGRAM (TEE);  Surgeon: Nigel Mormon, MD;  Location: [facility] ENDOSCOPY;  Service: Cardiovascular;  Laterality: N/A;  . TONSILLECTOMY     Social History:  reports that she has never smoked. She has never used smokeless tobacco. She reports that she drank alcohol. She reports that she does not use drugs.  Family / Support Systems Marital Status: Widow/Widower How Long?: 2 yrs Patient Roles: Parent, Other (Comment)(grandparent) Children: son, Jenny Reichmann (wife, Janace Hoard);  son,  Aaron Edelman (wife, Anderson [place] @ (C) [phone]); son, Gerald Stabs (wife, Amber @ (C) [phone]) Other Supports: pt's brother, Roselind Messier Anticipated Caregiver: Museum/gallery conservator + other dtr-in-laws and 3 sons Ability/Limitations of Caregiver: Museum/gallery conservator lives 5 mins away but can be there 24/7 Caregiver Availability: 24/7 Family Dynamics: Pt states, "I have 3 sons and 3 daughters-in-law but they really are my 'daughters'...they're wonderful to me."  Pt describes extremely supportive family and notes they are fully prepared to provide 24/7 assistance.  Social  History Preferred language: English Religion: Catholic Cultural Background: NA Read: Yes Write: Yes Employment Status: Retired Freight forwarder Issues: None Guardian/Conservator: None - per MD, pt is capable of making decisions on her own behalf.   Abuse/Neglect Abuse/Neglect Assessment Can Be Completed: Yes Physical Abuse: Denies Verbal Abuse: Denies Sexual Abuse: Denies Exploitation of patient/patient's resources: Denies Self-Neglect: Denies  Emotional Status Pt's affect, behavior adn adjustment status: Pt lying in bed and reports fatigue from morning therapies, however, smiling as she states she feels "so lucky" to be on CIR.  States, "this is where I need to be.  Not at a nursing home."  She describes herself as "very motivated" and "hopeful" she will make a good recovery on CIR.  She denies any emotional distress.  Will monitor while here. Recent Psychosocial Issues: Husband died ~2 yrs ago. Pyschiatric History: None Substance Abuse History: None  Patient / Family Perceptions, Expectations & Goals Pt/Family understanding of illness & functional limitations: Pt and family with good understanding of her recent UTI and newly found/ diagnosed osteomyelitis in spinal cord.  Good understanding of plan for antibiotics combined with CIR therapies.  Good understanding of her current functional limitations. Premorbid pt/family roles/activities: Pt was completely independent, driving, etc prior to June and initial UTI. Anticipated changes in roles/activities/participation: Given goals of min assist, dtr-in-laws to assume caregiver support roles and provision of 24/7 care. Pt/family expectations/goals: "I just want to get back to being independent again.  This is not me right now."  [employer]: None Premorbid Home Care/DME Agencies: None Transportation available at discharge: yes  Discharge Planning Living Arrangements: Alone Support Systems:  Children, Other relatives, Friends/neighbors Type of Residence: Private residence Insurance Resources: Medicare(UHC Medicare) Financial Resources: [place] Referred: No Living Expenses: Own Money Management: Patient Does the patient have any problems obtaining your medications?: No Home Management: pt Patient/Family Preliminary Plans: Pt plans to d/c to her own home with daughter-in-law, Amber to stay and be primary caregiver. Social Work Anticipated Follow Up Needs: HH/OP Expected length of stay: 24-28 days  Clinical Impression Very pleasant woman here following recent SNF placement due to decline following UTI.  Returned to hospital and newly diagnosed osteomyelitis in spine.  Pt "grateful" she was "able to come to rehab" here and hopeful she will eventually be able to regain her prior independence.  Has excellent family support with daughter-in-law, [name], planning to stay and provide 24/7 assistance.  Additional help from several other local family members as well.  Pt denies any significant emotional distress.  Will follow for support and d/c planning needs.  Kenyetta Fife 05/25/2018, 2:44 PM

## 2018-05-25 NOTE — Progress Notes (Signed)
Physical Therapy Session Note  Patient Details  Name: Erika Fry MRN: [id number] Date of Birth: 09-30-1946  Today's Date: 05/25/2018 PT Individual Time: 0805-0905 PT Individual Time Calculation (min): 60 min   Short Term Goals: Week 1:  PT Short Term Goal 1 (Week 1): Pt will tolerate 1 hour of sitting up in chair w/ minimal increase in pain or fatigue PT Short Term Goal 2 (Week 1): Pt will transfer bed<>chair w/ max assist x1 PT Short Term Goal 3 (Week 1): Pt will initiate gait training PT Short Term Goal 4 (Week 1): Pt will perform sit<>stand w/ LRAD, max assist x1  Skilled Therapeutic Interventions/Progress Updates: Pt presented in bed agreeable to therapy. Pt indicated decreased pain/"spasms" from weekend. Per pt pain in R flank to hip area and recently premedicated per nsg. Pt able to perform supine to sit via log roll technique minA x 2 and sidelying to sit with HOB elevated with modA x 1 for truncal support. Pt able to scoot to EOB with minA with noted significant guarding of R hip from pt. Pt able to maintain static sit at EOB x 6 min while Stedy located and perform gentle AROM at EOB including LAQ, ankle pumps, and hip flexion with WLP. Performed sit to/from stand from EOB with Stedy x 2 with modA. Pt able to come to erect posture with mod tactile cues for increased anterior wt shift at hips. Pt transferred to w/c and adjusted to tolerance. Pt instructed in w/c propulsion and able to perform with supervision x 50 ft with increased time. Pt agreeable to remain in w/c as tolerated and returned to room with call bell within reach and needs met.   Tx2: Pt refusal 75 min skilled PT. Pt indicated in too much pain and that spasms have come back more frequently. Per nsg received pain meds and not yet able to take additional ms relaxant. Discussed with pt relaxation techniques and followed up from OT regarding scheduling. Provided pt with ice pack and positioned for comfort. Discussed pt's  status with nsg.      Therapy Documentation Precautions:  Precautions Precautions: Fall Precaution Comments: Pacemaker placement 4 weeks ago Required Braces or Orthoses: Spinal Brace(LSO for comfort) Spinal Brace: Lumbar corset Restrictions Weight Bearing Restrictions: No General:   Vital Signs: Therapy Vitals Temp: 98.3 F (36.8 C) Temp Source: Oral Pulse Rate: 79 Resp: 14 BP: (!) 134/58 Patient Position (if appropriate): Lying Oxygen Therapy SpO2: 100 % O2 Device: Room Air Pain: Pain Assessment Pain Scale: 0-10 Pain Score: 7  Pain Type: Acute pain Pain Location: Back Pain Descriptors / Indicators: Spasm Pain Frequency: Constant Pain Onset: On-going Patients Stated Pain Goal: 2 Pain Intervention(s): Medication (See eMAR)   See Function Navigator for Current Functional Status.   Therapy/Group: Individual Therapy  Fidela Cieslak 05/25/2018, 4:04 PM

## 2018-05-25 NOTE — Progress Notes (Signed)
ANTIBIOTIC CONSULT NOTE - INITIAL  Pharmacy Consult for Daptomycin Indication: Lumbar discitis/osteo    Allergies  Allergen Reactions  . Codeine Other (See Comments)    Increases Pain and couldn't sleep    Patient Measurements: Height: 5\' 5"  (165.1 cm) Weight: 227 lb 6.4 oz (103.1 kg) IBW/kg (Calculated) : 57 Adjusted Body Weight:   Vital Signs: Temp: 97.9 F (36.6 C) (06/24 0636) Temp Source: Oral (06/24 0636) BP: 150/70 (06/24 0755) Pulse Rate: 75 (06/24 0636) Intake/Output from previous day: 06/23 0701 - 06/24 0700 In: 480 [P.O.:480] Out: 3000 [Urine:3000] Intake/Output from this shift: Total I/O In: 240 [P.O.:240] Out: -   Labs: Recent Labs    05/23/18 0403  WBC 8.2  HGB 9.6*  PLT 154  CREATININE 1.62*   Estimated Creatinine Clearance: 36.3 mL/min (A) (by C-G formula based on SCr of 1.62 mg/dL (H)). No results for input(s): VANCOTROUGH, VANCOPEAK, VANCORANDOM, GENTTROUGH, GENTPEAK, GENTRANDOM, TOBRATROUGH, TOBRAPEAK, TOBRARND, AMIKACINPEAK, AMIKACINTROU, AMIKACIN in the last 72 hours.   Microbiology:   Medical History: Past Medical History:  Diagnosis Date  . Arthritis    "knees, thumbs" (03/25/2018)  . CKD (chronic kidney disease), stage IV ([place])   . Diet-controlled diabetes mellitus ([place])   . GERD (gastroesophageal reflux disease)   . Gout    "on daily RX" (03/25/2018)  . Heart murmur   . High cholesterol   . Hypertension   . Hypothyroidism   . Iron deficiency anemia    "had to get an iron infusion"  . Migraine    "used to have them growing up" (03/25/2018)  . Presence of permanent cardiac pacemaker 03/25/2018   Assessment: ID: Osteomyelitis / lumbar discitis. Afebrile. WBC WNL  6/14: image-guided lumbar 4-lumbar 5 disc aspiration. PICC 6/17 - For 6 weeks of therapy.  Scr 1.62 and declining.  Zosyn 6/12>>6/16 CTX 6/16 >> (7/24) Vanco 6/12 >> 6/18 Dapto 6/18 >(7/24)  - 5/29: CK 81 -6/21: CK 139  6/14 Fungus Cx > Pending 6/14  Wound/Lumbar Aspirate Cx > ngtd, prelim 6/15 Blood Cx > ngtd  6/12 MRI: Findings concerning for acute osteomyelitis discitis involving the right aspect of the L4-5 interspace as above.   Plan:  -Continue Daptomycin 8mg /kg q 24h  -CTX 2g q24h per MD -CK weekly on Dapto (q Friday)   Corinda Gubler, PharmD, [facility] 05/25/2018,8:49 AM

## 2018-05-25 NOTE — Progress Notes (Signed)
Patient information reviewed and entered into eRehab system by Triton Heidrich, RN, CRRN, PPS Coordinator.  Information including medical coding and functional independence measure will be reviewed and updated through discharge.     Per nursing patient was given "Data Collection Information Summary for Patients in Inpatient Rehabilitation Facilities with attached "Privacy Act Statement-Health Care Records" upon admission.  

## 2018-05-25 NOTE — Progress Notes (Signed)
Subjective/Complaints: Patient seen lying in bed and then sitting at edge of bed working with therapies this morning. She states she slept well overnight. She states she had spasms over the weekend but that has improved today.  ROS- + right hip pain. Denies CP, SOB, Neg N/V/D  Objective: Vital Signs: Blood pressure (!) 150/70, pulse 75, temperature 97.9 F (36.6 C), temperature source Oral, resp. rate 18, height 5' 5"  (1.651 m), weight 103.1 kg (227 lb 6.4 oz), SpO2 99 %. No results found. Results for orders placed or performed during the hospital encounter of 05/22/18 (from the past 72 hour(s))  Glucose, capillary     Status: Abnormal   Collection Time: 05/22/18  3:19 PM  Result Value Ref Range   Glucose-Capillary 170 (H) 65 - 99 mg/dL  Glucose, capillary     Status: Abnormal   Collection Time: 05/22/18  5:23 PM  Result Value Ref Range   Glucose-Capillary 152 (H) 65 - 99 mg/dL  Glucose, capillary     Status: Abnormal   Collection Time: 05/22/18  9:40 PM  Result Value Ref Range   Glucose-Capillary 200 (H) 65 - 99 mg/dL  Comprehensive metabolic panel     Status: Abnormal   Collection Time: 05/23/18  4:03 AM  Result Value Ref Range   Sodium 133 (L) 135 - 145 mmol/L   Potassium 4.9 3.5 - 5.1 mmol/L   Chloride 104 101 - 111 mmol/L   CO2 21 (L) 22 - 32 mmol/L   Glucose, Bld 99 65 - 99 mg/dL   BUN 48 (H) 6 - 20 mg/dL   Creatinine, Ser 1.62 (H) 0.44 - 1.00 mg/dL   Calcium 8.9 8.9 - 10.3 mg/dL   Total Protein 5.2 (L) 6.5 - 8.1 g/dL   Albumin 2.3 (L) 3.5 - 5.0 g/dL   AST 29 15 - 41 U/L   ALT 26 14 - 54 U/L   Alkaline Phosphatase 53 38 - 126 U/L   Total Bilirubin 0.4 0.3 - 1.2 mg/dL   GFR calc non Af Amer 30 (L) >60 mL/min   GFR calc Af Amer 35 (L) >60 mL/min    Comment: (NOTE) The eGFR has been calculated using the CKD EPI equation. This calculation has not been validated in all clinical situations. eGFR's persistently <60 mL/min signify possible Chronic Kidney Disease.    Anion gap 8 5 - 15    Comment: Performed at [facility] [address], [place], [place] [address]  CBC WITH DIFFERENTIAL     Status: Abnormal   Collection Time: 05/23/18  4:03 AM  Result Value Ref Range   WBC 8.2 4.0 - 10.5 K/uL   RBC 3.23 (L) 3.87 - 5.11 MIL/uL   Hemoglobin 9.6 (L) 12.0 - 15.0 g/dL   HCT 29.2 (L) 36.0 - 46.0 %   MCV 90.4 78.0 - 100.0 fL   MCH 29.7 26.0 - 34.0 pg   MCHC 32.9 30.0 - 36.0 g/dL   RDW 16.3 (H) 11.5 - 15.5 %   Platelets 154 150 - 400 K/uL   Neutrophils Relative % 79 %   Neutro Abs 6.6 1.7 - 7.7 K/uL   Lymphocytes Relative 8 %   Lymphs Abs 0.6 (L) 0.7 - 4.0 K/uL   Monocytes Relative 9 %   Monocytes Absolute 0.7 0.1 - 1.0 K/uL   Eosinophils Relative 3 %   Eosinophils Absolute 0.2 0.0 - 0.7 K/uL   Basophils Relative 0 %   Basophils Absolute 0.0 0.0 - 0.1 K/uL  Immature Granulocytes 1 %   Abs Immature Granulocytes 0.1 0.0 - 0.1 K/uL    Comment: Performed at [facility], [place] [address], [place], [place] [address]  Glucose, capillary     Status: None   Collection Time: 05/23/18  6:52 AM  Result Value Ref Range   Glucose-Capillary 93 65 - 99 mg/dL  Glucose, capillary     Status: Abnormal   Collection Time: 05/23/18 11:38 AM  Result Value Ref Range   Glucose-Capillary 128 (H) 65 - 99 mg/dL  Glucose, capillary     Status: None   Collection Time: 05/23/18  4:47 PM  Result Value Ref Range   Glucose-Capillary 85 65 - 99 mg/dL  Glucose, capillary     Status: None   Collection Time: 05/23/18  9:39 PM  Result Value Ref Range   Glucose-Capillary 91 65 - 99 mg/dL  Glucose, capillary     Status: None   Collection Time: 05/24/18  6:22 AM  Result Value Ref Range   Glucose-Capillary 65 65 - 99 mg/dL  Glucose, capillary     Status: Abnormal   Collection Time: 05/24/18 11:40 AM  Result Value Ref Range   Glucose-Capillary 138 (H) 65 - 99 mg/dL  Glucose, capillary     Status: Abnormal   Collection Time: 05/24/18  4:44 PM  Result Value Ref  Range   Glucose-Capillary 209 (H) 65 - 99 mg/dL  Glucose, capillary     Status: Abnormal   Collection Time: 05/24/18  9:51 PM  Result Value Ref Range   Glucose-Capillary 291 (H) 65 - 99 mg/dL   Comment 1 Notify RN   Glucose, capillary     Status: Abnormal   Collection Time: 05/25/18  7:12 AM  Result Value Ref Range   Glucose-Capillary 198 (H) 65 - 99 mg/dL   Comment 1 Notify RN      Constitutional: No distress . Vital signs reviewed. HENT: Normocephalic.  Atraumatic. Eyes: EOMI. No discharge. Cardiovascular: RRR. No JVD. Respiratory: CTA Bilaterally. Normal effort. GI: BS +. Non-distended. Musc: No edema or tenderness in extremities. Neuro: Alert and Oriented Motor: bilateral upper extremities:: 4+/5 proximal distal Bilateral lower extremities: Hip flexion 1+/5, knee extension 3/5, ankle dorsiflexion 5/5 (left stronger than right) Skin: Warm and dry. Intact. Psych: Normal behavior and normal affect.  Assessment/Plan: 1. Functional deficits secondary to L4-5 Lumbar discitis which require 3+ hours per day of interdisciplinary therapy in a comprehensive inpatient rehab setting. Physiatrist is providing close team supervision and 24 hour management of active medical problems listed below. Physiatrist and rehab team continue to assess barriers to discharge/monitor patient progress toward functional and medical goals. FIM:             Function - Chair/bed transfer Chair/bed transfer activity did not occur: Refused  Function - Locomotion: Oceanographer activity did not occur: Refused Wheel 50 feet with 2 turns activity did not occur: Refused Wheel 150 feet activity did not occur: Refused Function - Locomotion: Ambulation Ambulation activity did not occur: Safety/medical concerns Walk 10 feet activity did not occur: Safety/medical concerns Walk 50 feet with 2 turns activity did not occur: Safety/medical concerns Walk 150 feet activity did not occur: Safety/medical  concerns Walk 10 feet on uneven surfaces activity did not occur: Safety/medical concerns  Function - Comprehension Comprehension: Auditory Comprehension assist level: Follows complex conversation/direction with no assist  Function - Expression Expression: Verbal Expression assist level: Expresses complex ideas: With no assist  Function - Social Interaction Social Interaction assist  level: Interacts appropriately with others - No medications needed.  Function - Problem Solving Problem solving assist level: Solves complex problems: Recognizes & self-corrects  Function - Memory Memory assist level: Complete Independence: No helper Patient normally able to recall (first 3 days only): Current season, Staff names and faces, That he or she is in a hospital  Medical Problem List and Plan:  1. Functional deficits secondary to L4-5 osteomyelitis/discitis, lumbar spondylosis   Continue CIR  Notes reviewed, labs reviewed, images reviewed 2. DVT Prophylaxis/Anticoagulation: Mechanical: Sequential compression devices, below knee Bilateral lower extremities   Doppler negative for DVT 3. Pain Management: Scheduled oxycodone prior to therapy to help with activity tolerance. Added ultram prn for moderate pain. Monitor and titrate medications to help with neuropathic symptoms. Decadron d/ced.   C/o RLE radicular pain, partially improved on gabapentin to 640m BID, but developed tremors, reduced to 4082m added low dose dexamethasone 4. Mood: LCSW to follow for evaluation and support.  5. Neuropsych: This patient is capable of making decisions on her own behalf.  6. Skin/Wound Care: routine pressure relief measures  7. Fluids/Electrolytes/Nutrition: Monitor I/O. Check lytes in am.  8. Lumbar diskitis: Afebrile on scheduled tylenol. On Daptomycin and ceftriaxone. Steroids may make it more difficult to fight infx, pt very uncomfortable and will restart low dose for limited time 9. Acute on chronic  anemia: Treated with iron and procrit on outpatient basis. Baseline Hgb- 9-10 range? Will continue to monitor serially   Hemoglobin 9.6 on 6/22  Continue to monitor 10. Symptomatic Bradycardia s/p PPM: Monitor HR bid.   Controlled at present 11. GIB with thrombocytopenia: Continue to monitor H/H serially for stability.   Platelets 154 on 6/22, stable  Cont to monitor 12. New diagnosis T2DM: Hgb A1c- 6.3. Poorly controlled due to recent rounds of steroids--Lantus was titrated upwards for better control. Was not on any medications at home. Monitor BS ac/hs--watch for hypoglycemic episodes as steroids tapered off.  CBG (last 3)  Recent Labs    05/24/18 1644 05/24/18 2151 05/25/18 0712  GLUCAP 209* 291* 198*   Elevated on 6/24, will consider further adjustments after steroids DC'd if necessary 13. Neuropathy BLE: Continue Gabapentin bid--monitor for SE.  14. Hyponatremia: Question due to intake. Added protein supplements with meals. monitor for now.   Sodium 133 on 6/22  Continue to monitor 15. Stage IV CKD: Baseline SCr 3.5-4.0. Monitor for SE/asterixis with narcotics.   Creatinine 1.62 on 6/22  Continue to monitor 16. Fluid overload: Monitor daily weights and for signs of overload. Resumed lasix at lower dose--was on 80 mg bid PTA. Filed Weights   05/22/18 1607  Weight: 103.1 kg (227 lb 6.4 oz)  17. Morbid obesity  Encourage weight loss 18. Essential hypertension  Continue Norvasc 10 mg daily  Hydralazine 50 mg 3 times a day, increased to 75 on 6/24 19. Hypoalbuminemia  Supplement initiated  LOS (Days) 3 A FACE TO FACE EVALUATION WAS PERFORMED  Hesper Venturella AnLorie Phenix/24/2019, 10:46 AM

## 2018-05-26 ENCOUNTER — Inpatient Hospital Stay (HOSPITAL_COMMUNITY): Payer: Medicare Other | Admitting: Physical Therapy

## 2018-05-26 ENCOUNTER — Inpatient Hospital Stay (HOSPITAL_COMMUNITY): Payer: Medicare Other

## 2018-05-26 ENCOUNTER — Inpatient Hospital Stay (HOSPITAL_COMMUNITY): Payer: Medicare Other | Admitting: Occupational Therapy

## 2018-05-26 DIAGNOSIS — R600 Localized edema: Secondary | ICD-10-CM

## 2018-05-26 DIAGNOSIS — M7989 Other specified soft tissue disorders: Secondary | ICD-10-CM

## 2018-05-26 LAB — GLUCOSE, CAPILLARY
GLUCOSE-CAPILLARY: 182 mg/dL — AB (ref 70–99)
Glucose-Capillary: 166 mg/dL — ABNORMAL HIGH (ref 70–99)
Glucose-Capillary: 146 mg/dL — ABNORMAL HIGH (ref 70–99)
Glucose-Capillary: 132 mg/dL — ABNORMAL HIGH (ref 70–99)

## 2018-05-26 NOTE — Progress Notes (Signed)
Physical Therapy Session Note  Patient Details  Name: Erika Fry MRN: [id number] Date of Birth: 05/10/1946  Today's Date: 05/26/2018 PT Individual Time: 1000-1100 and 1330-1445  PT Individual Time Calculation (min): 60 min and 75 min Short Term Goals: Week 1:  PT Short Term Goal 1 (Week 1): Pt will tolerate 1 hour of sitting up in chair w/ minimal increase in pain or fatigue PT Short Term Goal 2 (Week 1): Pt will transfer bed<>chair w/ max assist x1 PT Short Term Goal 3 (Week 1): Pt will initiate gait training PT Short Term Goal 4 (Week 1): Pt will perform sit<>stand w/ LRAD, max assist x1  Skilled Therapeutic Interventions/Progress Updates: Tx1: Pt presented in w/c agreeable to therapy. Pt indicated use of hot pack and adjustments to w/c has helped manage pain. Session focused on standing tolerance with use of standing frame. Pt transported to day room and set up to standing frame. Pt participated in x 2 bouts of standing frame with first trial approx 7 min and second trial approx 5 min. Pt stood while listening to "[employer]" which pt stated she enjoyed. During standing bouts PTA slightly slackened standing frame strap to allow for increased recruitment of BLE to maintain upright position. Pt also encouraged to perform small lateral wt shifts to increase wt bearing tolerance on R hip. Pt returned to sitting and participated in UBE x 3 min for endurance. Pt returned to room and returned to bed via use of Sara lift. Pt required modA x 2 to scoot to [facility] with pt using bed rails to assist. Pt positioned for comfort and PTA placed hot pack at R buttock. Pt in bed at end of session with bed alarm on and needs met.   Tx2: Pt presented in bed agreeable to therapy. Session focused on upright tolerance/OOB activity. Pt indicated increased pain at R hip however no spasms, nor numerical assessment given. Pt performed ankle pumps, heel slides, AA hip abd/add, pillow squeezes, QS, GS WLP  X 10 ea.  Performed supine to sit at EOB with modA with HOB elevated, and cues for increased use of BUE. Performed bed to w/c transfer with use of Clarise Cruz. Pt transported to rehab gym and performed w/c to mat transfer with use of Stedy requiring maxA x 2. Pt participated in seated dynamic balance activities at EOB including ball toss and hitting ball with 2# dowel. Pt with x 1 slight LOB to L initially however was able to recover and improved with time. Pt able to tolerate unsupported sitting approx 20 min. Performed sit to stand with use of Stedy maxA x 2 to return to w/c. Pt participated in w/c propulsion back to room with minA for turns. Performed Stedy maxA x 2 return to bed and required modA sit to spine for BLE management. Pt positioned to comfort and left in bed with bed alarm on, call bell within reach and needs met.      Therapy Documentation Precautions:  Precautions Precautions: Fall Precaution Comments: Pacemaker placement 4 weeks ago Required Braces or Orthoses: Spinal Brace(LSO for comfort) Spinal Brace: Lumbar corset Restrictions Weight Bearing Restrictions: No General:   Vital Signs: Therapy Vitals Temp: 98.4 F (36.9 C) Temp Source: Oral Pulse Rate: 85 Resp: 19 BP: 114/62 Patient Position (if appropriate): Lying Oxygen Therapy SpO2: 97 % O2 Device: Room Air Pain: Pain Assessment Pain Score: 2    See Function Navigator for Current Functional Status.   Therapy/Group: Individual Therapy  Ijanae Macapagal  Nihal Doan,  PTA  05/26/2018, 4:19 PM

## 2018-05-26 NOTE — Care Management (Signed)
Inpatient [facility] Individual Statement of Services  Patient Name:  Erika Fry  Date:  05/26/2018  Welcome to the [facility].  Our goal is to provide you with an individualized program based on your diagnosis and situation, designed to meet your specific needs.  With this comprehensive rehabilitation program, you will be expected to participate in at least 3 hours of rehabilitation therapies Monday-Friday, with modified therapy programming on the weekends.  Your rehabilitation program will include the following services:  Physical Therapy (PT), Occupational Therapy (OT), 24 hour per day rehabilitation nursing, Therapeutic Recreaction (TR), Neuropsychology, Case Management (Social Worker), Rehabilitation Medicine, Nutrition Services and Pharmacy Services  Weekly team conferences will be held on Wednesdays to discuss your progress.  Your Social Worker will talk with you frequently to get your input and to update you on team discussions.  Team conferences with you and your family in attendance may also be held.  Expected length of stay: 24-28 days    Overall anticipated outcome: minimal assistance  Depending on your progress and recovery, your program may change. Your Social Worker will coordinate services and will keep you informed of any changes. Your Social Worker's name and contact numbers are listed  below.  The following services may also be recommended but are not provided by the [facility] will be made to provide these services after discharge if needed.  Arrangements include referral to agencies that provide these services.  Your insurance has been verified to be:  [facility] Medicare Your primary doctor is:  [facility]  Pertinent information will be shared with your doctor and your insurance company.  Social Worker:   [place], [place] or ([phone]   Information discussed with and copy given to patient by: Lennart Pall, 05/26/2018, 11:43 AM

## 2018-05-26 NOTE — Progress Notes (Signed)
Subjective/Complaints: Patient seen sitting up in bed this morning. She states she slept well overnight. She has questions about her left hand edema. She also would like to know her most recent hemoglobin.  ROS- denies CP, SOB, Neg N/V/D  Objective: Vital Signs: Blood pressure (!) 139/99, pulse 77, temperature 97.9 F (36.6 C), temperature source Oral, resp. rate 18, height 5\' 5"  (1.651 m), weight 101.1 kg (222 lb 14.2 oz), SpO2 100 %. No results found. Results for orders placed or performed during the hospital encounter of 05/22/18 (from the past 72 hour(s))  Glucose, capillary     Status: Abnormal   Collection Time: 05/23/18 11:38 AM  Result Value Ref Range   Glucose-Capillary 128 (H) 65 - 99 mg/dL  Glucose, capillary     Status: None   Collection Time: 05/23/18  4:47 PM  Result Value Ref Range   Glucose-Capillary 85 65 - 99 mg/dL  Glucose, capillary     Status: None   Collection Time: 05/23/18  9:39 PM  Result Value Ref Range   Glucose-Capillary 91 65 - 99 mg/dL  Glucose, capillary     Status: None   Collection Time: 05/24/18  6:22 AM  Result Value Ref Range   Glucose-Capillary 65 65 - 99 mg/dL  Glucose, capillary     Status: Abnormal   Collection Time: 05/24/18 11:40 AM  Result Value Ref Range   Glucose-Capillary 138 (H) 65 - 99 mg/dL  Glucose, capillary     Status: Abnormal   Collection Time: 05/24/18  4:44 PM  Result Value Ref Range   Glucose-Capillary 209 (H) 65 - 99 mg/dL  Glucose, capillary     Status: Abnormal   Collection Time: 05/24/18  9:51 PM  Result Value Ref Range   Glucose-Capillary 291 (H) 65 - 99 mg/dL   Comment 1 Notify RN   Glucose, capillary     Status: Abnormal   Collection Time: 05/25/18  7:12 AM  Result Value Ref Range   Glucose-Capillary 198 (H) 65 - 99 mg/dL   Comment 1 Notify RN   Glucose, capillary     Status: Abnormal   Collection Time: 05/25/18 11:42 AM  Result Value Ref Range   Glucose-Capillary 176 (H) 65 - 99 mg/dL  Glucose,  capillary     Status: Abnormal   Collection Time: 05/25/18  4:51 PM  Result Value Ref Range   Glucose-Capillary 257 (H) 65 - 99 mg/dL  Glucose, capillary     Status: Abnormal   Collection Time: 05/25/18  9:05 PM  Result Value Ref Range   Glucose-Capillary 182 (H) 65 - 99 mg/dL  Glucose, capillary     Status: Abnormal   Collection Time: 05/26/18  6:46 AM  Result Value Ref Range   Glucose-Capillary 166 (H) 70 - 99 mg/dL     Constitutional: No distress . Vital signs reviewed. HENT: Normocephalic.  Atraumatic. Eyes: EOMI. No discharge. Cardiovascular: RRR. No JVD. Respiratory: CTA Bilaterally. Normal effort. GI: BS +. Non-distended. Musc: generalized edema, >> left hand Neuro: Alert and Oriented Motor: Bilateral upper extremities: 4+/5 proximal distal Bilateral lower extremities: Hip flexion 1+/5, knee extension 3/5, ankle dorsiflexion 5/5 (left stronger than right) Skin: Warm and dry. Intact. Psych: Normal behavior and normal affect.  Assessment/Plan: 1. Functional deficits secondary to L4-5 Lumbar discitis which require 3+ hours per day of interdisciplinary therapy in a comprehensive inpatient rehab setting. Physiatrist is providing close team supervision and 24 hour management of active medical problems listed below. Physiatrist and rehab team continue to  assess barriers to discharge/monitor patient progress toward functional and medical goals. FIM:       Function - Toileting Toileting steps completed by helper: Adjust clothing prior to toileting, Performs perineal hygiene, Adjust clothing after toileting(per Brianna Collinson, NT report) Assist level: Two helpers(per Brianna Collinson, NT report)     Function - Chair/bed transfer Chair/bed transfer activity did not occur: Refused  Function - Locomotion: Oceanographer activity did not occur: Refused Wheel 50 feet with 2 turns activity did not occur: Refused Wheel 150 feet activity did not occur:  Refused Function - Locomotion: Ambulation Ambulation activity did not occur: Safety/medical concerns Walk 10 feet activity did not occur: Safety/medical concerns Walk 50 feet with 2 turns activity did not occur: Safety/medical concerns Walk 150 feet activity did not occur: Safety/medical concerns Walk 10 feet on uneven surfaces activity did not occur: Safety/medical concerns  Function - Comprehension Comprehension: Auditory Comprehension assist level: Follows complex conversation/direction with no assist  Function - Expression Expression: Verbal Expression assist level: Expresses complex ideas: With no assist  Function - Social Interaction Social Interaction assist level: Interacts appropriately with others - No medications needed.  Function - Problem Solving Problem solving assist level: Solves complex problems: Recognizes & self-corrects  Function - Memory Memory assist level: Complete Independence: No helper Patient normally able to recall (first 3 days only): Current season, Staff names and faces, That he or she is in a hospital  Medical Problem List and Plan:  1. Functional deficits secondary to L4-5 osteomyelitis/discitis, lumbar spondylosis   Continue CIR 2. DVT Prophylaxis/Anticoagulation: Mechanical: Sequential compression devices, below knee Bilateral lower extremities   Doppler negative for DVT in LE  LUE doppler ordered to evaluate for DVT 3. Pain Management: Scheduled oxycodone prior to therapy to help with activity tolerance. Added ultram prn for moderate pain. Monitor and titrate medications to help with neuropathic symptoms. Decadron d/ced.   C/o RLE radicular pain, partially improved on gabapentin to 600mg  BID, but developed tremors, reduced to 400mg , added low dose dexamethasone, plan to d/c tomorrow 4. Mood: LCSW to follow for evaluation and support.  5. Neuropsych: This patient is capable of making decisions on her own behalf.  6. Skin/Wound Care: routine  pressure relief measures  7. Fluids/Electrolytes/Nutrition: Monitor I/O. Check lytes in am.  8. Lumbar diskitis: Afebrile on scheduled tylenol. On Daptomycin and ceftriaxone. Steroids may make it more difficult to fight infx, pt very uncomfortable and restarted low dose for limited time 9. Acute on chronic anemia: Treated with iron and procrit on outpatient basis. Baseline Hgb- 9-10 range? Will continue to monitor serially   Hemoglobin 9.6 on 6/22  Continue to monitor 10. Symptomatic Bradycardia s/p PPM: Monitor HR bid.   Controlled at present 11. GIB with thrombocytopenia: Continue to monitor H/H serially for stability.   Platelets 154 on 6/22, stable  Cont to monitor 12. New diagnosis T2DM: Hgb A1c- 6.3. Poorly controlled due to recent rounds of steroids--Lantus was titrated upwards for better control. Was not on any medications at home. Monitor BS ac/hs--watch for hypoglycemic episodes as steroids tapered off.  CBG (last 3)  Recent Labs    05/25/18 1651 05/25/18 2105 05/26/18 0646  GLUCAP 257* 182* 166*   Elevated on 6/24, will consider further adjustments after steroids DC'd if necessary 13. Neuropathy BLE: Continue Gabapentin bid--monitor for SE.  14. Hyponatremia: Question due to intake. Added protein supplements with meals. monitor for now.   Sodium 133 on 6/22  Labs ordered for tomorrow  Continue to monitor  15. Stage IV CKD: Baseline SCr 3.5-4.0. Monitor for SE/asterixis with narcotics.   Creatinine 1.62 on 6/22  Labs ordered for tomorrow  Continue to monitor 16. Fluid overload: Monitor daily weights and for signs of overload. Resumed lasix at lower dose--was on 80 mg bid PTA. Filed Weights   05/22/18 1607 05/26/18 0600  Weight: 103.1 kg (227 lb 6.4 oz) 101.1 kg (222 lb 14.2 oz)  17. Morbid obesity  Encourage weight loss 18. Essential hypertension  Continue Norvasc 10 mg daily  Hydralazine 50 mg 3 times a day, increased to 75 on 6/24  Improving on 6/25 19.  Hypoalbuminemia  Supplement initiated  LOS (Days) 4 A FACE TO FACE EVALUATION WAS PERFORMED  Ankit [name] 05/26/2018, 8:54 AM

## 2018-05-26 NOTE — Progress Notes (Signed)
Preliminary results by tech - Left Upper Ext. Venous Duplex Completed. Negative for deep and superficial vein thrombosis. Oda Cogan, BS, RDMS, RVT

## 2018-05-26 NOTE — Progress Notes (Signed)
Occupational Therapy Session Note  Patient Details  Name: Erika Fry MRN: [id number] Date of Birth: [birth year]  Today's Date: 05/26/2018 OT Individual Time: 8889-1694 OT Individual Time Calculation (min): 56 min    Short Term Goals: Week 1:  OT Short Term Goal 1 (Week 1): Pt will report no more than 5/10 pain in order to participate more functionally in ADLs OT Short Term Goal 2 (Week 1): Pt will sit EOB for >10 min with no more than mod A to participate in morning ADL routine OT Short Term Goal 3 (Week 1): Pt will don shirt with min A  OT Short Term Goal 4 (Week 1): Pt will adhere to back precautions during bed mobility with no more than 2 vc   Skilled Therapeutic Interventions/Progress Updates:    Treatment session with focus on ADL retraining and OOB activity tolerance.  Pt received supine in bed reporting increased pain and having recently received pain meds.  Pt hesitant to engage in bed mobility or any self-care tasks, however with encouragement and therapeutic use of self pt able to come to sitting at EOB with min-mod assist.  Engaged in UB dressing with assist for weight shifting due to pain to lift UE to thread shirt, required total assist due to pain and decreased active participation with dressing.  Completed LB dressing with cues to lift LE to allow therapist to thread pants and encouraged pt to reach towards knee to pull pants over knees.  Engaged in sit > stand in [place] with +2 to facilitate anterior weight shift to lift buttocks to stand.  Max cues for hand placement and visual cues to facilitate weight shift and keeping chest upright to allow for weight shift into standing.  +2 to pull pants over hips while pt maintained partial standing in [place].  Engaged in grooming tasks at sink seated on Stedy seat with cues for upright trunk and reaching to facilitate upright posture.  Provided pt with pressure relieving cushion in w/c and elevating leg rests to promote increased  positioning and decreased pain in RLE in sitting in w/c.  Applied heat to Rt hip for pain management as well.  Pt encouraged to sit up 30 mins until next therapy session.  Therapy Documentation Precautions:  Precautions Precautions: Fall Precaution Comments: Pacemaker placement 4 weeks ago Required Braces or Orthoses: Spinal Brace(LSO for comfort) Spinal Brace: Lumbar corset Restrictions Weight Bearing Restrictions: No General:   Vital Signs:  Pain: Pain Assessment Pain Scale: 0-10 Pain Score: 6  Pain Type: Acute pain Pain Location: Back Pain Descriptors / Indicators: Spasm Pain Frequency: Constant Pain Onset: On-going Patients Stated Pain Goal: 2 Pain Intervention(s): Medication (See eMAR)  See Function Navigator for Current Functional Status.   Therapy/Group: Individual Therapy  Simonne Come 05/26/2018, 12:27 PM

## 2018-05-27 ENCOUNTER — Inpatient Hospital Stay (HOSPITAL_COMMUNITY): Payer: Medicare Other

## 2018-05-27 ENCOUNTER — Encounter: Payer: Self-pay | Admitting: Gastroenterology

## 2018-05-27 ENCOUNTER — Inpatient Hospital Stay (HOSPITAL_COMMUNITY): Payer: Medicare Other | Admitting: Physical Therapy

## 2018-05-27 ENCOUNTER — Inpatient Hospital Stay (HOSPITAL_COMMUNITY): Payer: Medicare Other | Admitting: Occupational Therapy

## 2018-05-27 ENCOUNTER — Inpatient Hospital Stay (HOSPITAL_COMMUNITY): Payer: Medicare Other | Admitting: *Deleted

## 2018-05-27 DIAGNOSIS — E875 Hyperkalemia: Secondary | ICD-10-CM

## 2018-05-27 LAB — GLUCOSE, CAPILLARY
GLUCOSE-CAPILLARY: 162 mg/dL — AB (ref 70–99)
Glucose-Capillary: 118 mg/dL — ABNORMAL HIGH (ref 70–99)
Glucose-Capillary: 141 mg/dL — ABNORMAL HIGH (ref 70–99)
Glucose-Capillary: 161 mg/dL — ABNORMAL HIGH (ref 70–99)

## 2018-05-27 LAB — BASIC METABOLIC PANEL
Anion gap: 8 (ref 5–15)
BUN: 48 mg/dL — ABNORMAL HIGH (ref 8–23)
CALCIUM: 9.3 mg/dL (ref 8.9–10.3)
CO2: 23 mmol/L (ref 22–32)
Chloride: 102 mmol/L (ref 98–111)
Creatinine, Ser: 2.18 mg/dL — ABNORMAL HIGH (ref 0.44–1.00)
GFR, EST AFRICAN AMERICAN: 24 mL/min — AB (ref >60)
GFR, EST NON AFRICAN AMERICAN: 21 mL/min — AB (ref >60)
GLUCOSE: 126 mg/dL — AB (ref 70–99)
POTASSIUM: 5.2 mmol/L — AB (ref 3.5–5.1)
Sodium: 133 mmol/L — ABNORMAL LOW (ref 135–145)

## 2018-05-27 MED ORDER — SODIUM CHLORIDE 0.9 % IV SOLN
INTRAVENOUS | Status: DC
  Administered 2018-05-27 – 2018-06-03 (×3): via INTRAVENOUS

## 2018-05-27 MED ORDER — BACLOFEN 10 MG PO TABS
10.0000 mg | ORAL_TABLET | Freq: Three times a day (TID) | ORAL | Status: DC
  Administered 2018-05-27 (×3): 10 mg via ORAL
  Filled 2018-05-27 (×2): qty 1

## 2018-05-27 NOTE — Progress Notes (Signed)
Physical Therapy Session Note  Patient Details  Name: Erika Fry MRN: [id number] Date of Birth: 07-25-1946  Today's Date: 05/27/2018 PT Individual Time: 1000-1100 PT Individual Time Calculation (min): 60 min   Short Term Goals: Week 1:  PT Short Term Goal 1 (Week 1): Pt will tolerate 1 hour of sitting up in chair w/ minimal increase in pain or fatigue PT Short Term Goal 2 (Week 1): Pt will transfer bed<>chair w/ max assist x1 PT Short Term Goal 3 (Week 1): Pt will initiate gait training PT Short Term Goal 4 (Week 1): Pt will perform sit<>stand w/ LRAD, max assist x1  Skilled Therapeutic Interventions/Progress Updates: Pt presented in w/c agreeable to therapy. Pt c/o of continued R hip pain with intermittent spasms. Pt propelled w/c approx 165f for BUE and endurance. Pt transported remaining distance to day room. Pt participated in standing frame for standing tolerance. Pt able to tolerate standing frame approx 12 min with pt intermittent performing lateral sways, glute sets, and quad sets for achieve full extension. Returned to w/c with pt verbalizing increased frequency for ms spasms which calmed down after a few minutes. Pt returned to room and performed sit to stand in Stedy x 3. PTA providing manual facilitation for increasing anterior wt shift for improved erect posture. Performed Stedy transfer back to bed and performed sit to supine transfer mod A for BLE management. Required maxA x 2 [facility] repositioned for comfort and left with call bell within reach and needs met.      Therapy Documentation Precautions:  Precautions Precautions: Fall Precaution Comments: Pacemaker placement 4 weeks ago Required Braces or Orthoses: Spinal Brace(LSO for comfort) Spinal Brace: Lumbar corset Restrictions Weight Bearing Restrictions: No General:   Vital Signs: Therapy Vitals Temp: 98.1 F (36.7 C) Temp Source: Oral Pulse Rate: 75 Resp: 19 BP: (!) 144/66 Patient  Position (if appropriate): Lying Oxygen Therapy SpO2: 98 % O2 Device: Room Air Pain: Pain Assessment Pain Score: 5   See Function Navigator for Current Functional Status.   Therapy/Group: Individual Therapy  Erika Fry  Erika Fry, PTA  05/27/2018, 4:26 PM

## 2018-05-27 NOTE — Progress Notes (Signed)
Occupational Therapy Session Note  Patient Details  Name: Erika Fry MRN: [id number] Date of Birth: 05-24-1946  Today's Date: 05/27/2018 OT Individual Time: 0830-0930 OT Individual Time Calculation (min): 60 min    Short Term Goals: Week 1:  OT Short Term Goal 1 (Week 1): Pt will report no more than 5/10 pain in order to participate more functionally in ADLs OT Short Term Goal 2 (Week 1): Pt will sit EOB for >10 min with no more than mod A to participate in morning ADL routine OT Short Term Goal 3 (Week 1): Pt will don shirt with min A  OT Short Term Goal 4 (Week 1): Pt will adhere to back precautions during bed mobility with no more than 2 vc   Skilled Therapeutic Interventions/Progress Updates:    Treatment session focused on ADLs/self care training, transfer training, pain management/relaxation techniques, and pt education. Upon entering pt in bed with nsg administering medications. Therapist and pt establish rapport and pt is agreeable to therapy tasks. Pt participated in [facility] and prolonged stretching to R hip d/t continuous c/o pain in R hip. At rest while laying in bed pt reports pain is 10/10. With gentle AAROM for hip flexion and knee extension for several reps, pt reports pain moves to 4/10. Therapist explain muscle spasms may be a result of limited mobility from being in the bed and pt is willing to get OOB for sink side ADLs. Pt requires max A to sit EOB. Therapist use STEDY with max A x 2 for sit<>stand to STEDY. At this time, thearpist provide total A for LB dressing. While in STEDY, pt instructed on sitting upright with use of B UE to lift her chest to complete sinkside ADLS. Pt completed tasks with set up A in STEDY with B UE supporting her in front of mirror to encourage upright posture. Pt completed toileting task with OTC using Stedy with max A x 2. Therapist continue to encourage pt to practice deep breathing and relaxation/visualization techniques with pt for reducing  persistence on her pain and encourage central nervous system response. Pt reported feeling "good" up in w/c and agreed to sit up for 30 minutes until next therapy. Left resting with call bell within reach and needs met.    Therapy Documentation Precautions:  Precautions Precautions: Fall Precaution Comments: Pacemaker placement 4 weeks ago Required Braces or Orthoses: Spinal Brace(LSO for comfort) Spinal Brace: Lumbar corset Restrictions Weight Bearing Restrictions: No   Pain: Pain Assessment Pain Score: 9  Pain Location: Hip Pain Orientation: Right Pain Descriptors / Indicators: Aching;Spasm Pain Onset: On-going Patients Stated Pain Goal: 4 Pain Intervention(s): Repositioned;Relaxation Multiple Pain Sites: No ADL: ADL ADL Comments: See functional navigator  See Function Navigator for Current Functional Status.   Therapy/Group: Individual Therapy  Delon [place] 05/27/2018, 12:23 PM

## 2018-05-27 NOTE — Progress Notes (Signed)
Subjective/Complaints: Patient seen lying in bed this morning. She states she did not sleep well overnight due to spasms. They have improved somewhat this morning.  ROS- +right lower extremity spasms. denies CP, SOB, Neg N/V/D  Objective: Vital Signs: Blood pressure 124/84, pulse 86, temperature 98.2 F (36.8 C), resp. rate 17, height 5' 5"  (1.651 m), weight 105.9 kg (233 lb 7.5 oz), SpO2 96 %. No results found. Results for orders placed or performed during the hospital encounter of 05/22/18 (from the past 72 hour(s))  Glucose, capillary     Status: Abnormal   Collection Time: 05/24/18 11:40 AM  Result Value Ref Range   Glucose-Capillary 138 (H) 65 - 99 mg/dL  Glucose, capillary     Status: Abnormal   Collection Time: 05/24/18  4:44 PM  Result Value Ref Range   Glucose-Capillary 209 (H) 65 - 99 mg/dL  Glucose, capillary     Status: Abnormal   Collection Time: 05/24/18  9:51 PM  Result Value Ref Range   Glucose-Capillary 291 (H) 65 - 99 mg/dL   Comment 1 Notify RN   Glucose, capillary     Status: Abnormal   Collection Time: 05/25/18  7:12 AM  Result Value Ref Range   Glucose-Capillary 198 (H) 65 - 99 mg/dL   Comment 1 Notify RN   Glucose, capillary     Status: Abnormal   Collection Time: 05/25/18 11:42 AM  Result Value Ref Range   Glucose-Capillary 176 (H) 65 - 99 mg/dL  Glucose, capillary     Status: Abnormal   Collection Time: 05/25/18  4:51 PM  Result Value Ref Range   Glucose-Capillary 257 (H) 65 - 99 mg/dL  Glucose, capillary     Status: Abnormal   Collection Time: 05/25/18  9:05 PM  Result Value Ref Range   Glucose-Capillary 182 (H) 65 - 99 mg/dL  Glucose, capillary     Status: Abnormal   Collection Time: 05/26/18  6:46 AM  Result Value Ref Range   Glucose-Capillary 166 (H) 70 - 99 mg/dL  Glucose, capillary     Status: Abnormal   Collection Time: 05/26/18 11:39 AM  Result Value Ref Range   Glucose-Capillary 146 (H) 70 - 99 mg/dL  Glucose, capillary     Status:  Abnormal   Collection Time: 05/26/18  4:45 PM  Result Value Ref Range   Glucose-Capillary 182 (H) 70 - 99 mg/dL  Glucose, capillary     Status: Abnormal   Collection Time: 05/26/18 10:08 PM  Result Value Ref Range   Glucose-Capillary 132 (H) 70 - 99 mg/dL  Basic metabolic panel     Status: Abnormal   Collection Time: 05/27/18  4:20 AM  Result Value Ref Range   Sodium 133 (L) 135 - 145 mmol/L   Potassium 5.2 (H) 3.5 - 5.1 mmol/L   Chloride 102 98 - 111 mmol/L    Comment: Please note change in reference range.   CO2 23 22 - 32 mmol/L   Glucose, Bld 126 (H) 70 - 99 mg/dL    Comment: Please note change in reference range.   BUN 48 (H) 8 - 23 mg/dL    Comment: Please note change in reference range.   Creatinine, Ser 2.18 (H) 0.44 - 1.00 mg/dL   Calcium 9.3 8.9 - 10.3 mg/dL   GFR calc non Af Amer 21 (L) >60 mL/min   GFR calc Af Amer 24 (L) >60 mL/min    Comment: (NOTE) The eGFR has been calculated using the CKD EPI equation.  This calculation has not been validated in all clinical situations. eGFR's persistently <60 mL/min signify possible Chronic Kidney Disease.    Anion gap 8 5 - 15    Comment: Performed at [facility] [address], [place], [place] [address]  Glucose, capillary     Status: Abnormal   Collection Time: 05/27/18  6:49 AM  Result Value Ref Range   Glucose-Capillary 118 (H) 70 - 99 mg/dL     Constitutional: No distress . Vital signs reviewed. HENT: Normocephalic.  Atraumatic. Eyes: EOMI. No discharge. Cardiovascular: RRR. No JVD. Respiratory: CTA Bilaterally.  Normal effort. GI: BS +. Non-distended. Musc: generalized edema, focal edema to left hand improved Neuro: Alert and Oriented Motor: Bilateral upper extremities: 4+/5 proximal distal Bilateral lower extremities: Hip flexion 1+/5, knee extension 3/5, ankle dorsiflexion 5/5 (left stronger than right, unchanged) Skin: Warm and dry. Intact. Psych: Normal behavior and normal  affect.  Assessment/Plan: 1. Functional deficits secondary to L4-5 Lumbar discitis which require 3+ hours per day of interdisciplinary therapy in a comprehensive inpatient rehab setting. Physiatrist is providing close team supervision and 24 hour management of active medical problems listed below. Physiatrist and rehab team continue to assess barriers to discharge/monitor patient progress toward functional and medical goals. FIM: Function - Bathing Bathing activity did not occur: Refused  Function- Upper Body Dressing/Undressing Upper body dressing/undressing activity did not occur: Refused What is the patient wearing?: Pull over shirt/dress, Orthosis Pull over shirt/dress - Perfomed by helper: Thread/unthread right sleeve, Thread/unthread left sleeve, Put head through opening, Pull shirt over trunk Orthosis activity level: Performed by helper Function - Lower Body Dressing/Undressing Lower body dressing/undressing activity did not occur: Refused What is the patient wearing?: Pants Position: Sitting EOB Pants- Performed by helper: Thread/unthread right pants leg, Thread/unthread left pants leg, Pull pants up/down Assist for lower body dressing: 2 Helpers  Function - Toileting Toileting steps completed by helper: Adjust clothing prior to toileting, Performs perineal hygiene, Adjust clothing after toileting(per Brianna Collinson, NT report) Assist level: Two helpers(per Brianna Collinson, NT report)  Function - Air cabin crew transfer activity did not occur: Refused  Function - Chair/bed transfer Chair/bed transfer activity did not occur: Refused  Function - Locomotion: Oceanographer activity did not occur: Refused Wheel 50 feet with 2 turns activity did not occur: Refused Wheel 150 feet activity did not occur: Refused Function - Locomotion: Ambulation Ambulation activity did not occur: Safety/medical concerns Walk 10 feet activity did not occur: Safety/medical  concerns Walk 50 feet with 2 turns activity did not occur: Safety/medical concerns Walk 150 feet activity did not occur: Safety/medical concerns Walk 10 feet on uneven surfaces activity did not occur: Safety/medical concerns  Function - Comprehension Comprehension: Auditory Comprehension assist level: Follows complex conversation/direction with no assist  Function - Expression Expression: Verbal Expression assist level: Expresses complex ideas: With no assist  Function - Social Interaction Social Interaction assist level: Interacts appropriately with others - No medications needed.  Function - Problem Solving Problem solving assist level: Solves complex problems: Recognizes & self-corrects  Function - Memory Memory assist level: Complete Independence: No helper Patient normally able to recall (first 3 days only): Current season, Staff names and faces, That he or she is in a hospital  Medical Problem List and Plan:  1. Functional deficits secondary to L4-5 osteomyelitis/discitis, lumbar spondylosis   Continue CIR 2. DVT Prophylaxis/Anticoagulation: Mechanical: Sequential compression devices, below knee Bilateral lower extremities   Doppler negative for DVT in LE  LUE doppler ordered  to evaluate for DVT, negative 3. Pain Management: Scheduled oxycodone prior to therapy to help with activity tolerance. Added ultram prn for moderate pain. Monitor and titrate medications to help with neuropathic symptoms. Decadron d/ced.   C/o RLE radicular pain, partially improved on gabapentin to 613m BID   Low dose dexamethasone DC'd on 6/26  Methocarbamol changed to baclofen  10 3 times a day on 6/26 4. Mood: LCSW to follow for evaluation and support.  5. Neuropsych: This patient is capable of making decisions on her own behalf.  6. Skin/Wound Care: routine pressure relief measures  7. Fluids/Electrolytes/Nutrition: Monitor I/O. Check lytes in am.  8. Lumbar diskitis: Afebrile on scheduled  tylenol. On Daptomycin and ceftriaxone. Steroids may make it more difficult to fight infx, pt very uncomfortable and restarted low dose for limited time, DC'd on 6/26 9. Acute on chronic anemia: Treated with iron and procrit on outpatient basis. Baseline Hgb- 9-10 range? Will continue to monitor serially   Hemoglobin 9.6 on 6/22  Continue to monitor 10. Symptomatic Bradycardia s/p PPM: Monitor HR bid.   Controlled at present 11. GIB with thrombocytopenia: Continue to monitor H/H serially for stability.   Platelets 154 on 6/22, stable  Cont to monitor 12. New diagnosis T2DM: Hgb A1c- 6.3. Poorly controlled due to recent rounds of steroids--Lantus was titrated upwards for better control. Was not on any medications at home. Monitor BS ac/hs--watch for hypoglycemic episodes as steroids tapered off.  CBG (last 3)  Recent Labs    05/26/18 1645 05/26/18 2208 05/27/18 0649  GLUCAP 182* 132* 118*   Improving on 6/26, will consider further adjustments after steroids DC'd if necessary 13. Neuropathy BLE: Continue Gabapentin bid--monitor for SE.  14. Hyponatremia: Question due to intake. Added protein supplements with meals. monitor for now.   Sodium 133 on 6/26  Continue to monitor 15. Stage IV CKD: Baseline SCr 3.5-4.0. Monitor for SE/asterixis with narcotics.   Creatinine 2.18 on 6/26  Encourage fluids  Continue to monitor 16. Fluid overload: Monitor daily weights and for signs of overload. Resumed lasix at lower dose--was on 80 mg bid PTA. Filed Weights   05/22/18 1607 05/26/18 0600 05/27/18 0500  Weight: 103.1 kg (227 lb 6.4 oz) 101.1 kg (222 lb 14.2 oz) 105.9 kg (233 lb 7.5 oz)  17. Morbid obesity  Encourage weight loss 18. Essential hypertension  Continue Norvasc 10 mg daily  Hydralazine 50 mg 3 times a day, increased to 75 on 6/24  Slightly labile on 6/26 19. Hypoalbuminemia  Supplement initiated 20. Hyperkalemia   potassium 5.2 on 6/26  Continue to monitor  LOS (Days) 5 A FACE  TO FACE EVALUATION WAS PERFORMED  Ankit ALorie Phenix6/26/2019, 9:41 AM

## 2018-05-27 NOTE — Progress Notes (Signed)
Physical Therapy Session Note  Patient Details  Name: Erika Fry MRN: [id number] Date of Birth: [birth year]  Today's Date: 05/27/2018 PT Individual Time: 1330-1445 PT Individual Time Calculation (min): 75 min   Short Term Goals: Week 1:  PT Short Term Goal 1 (Week 1): Pt will tolerate 1 hour of sitting up in chair w/ minimal increase in pain or fatigue PT Short Term Goal 2 (Week 1): Pt will transfer bed<>chair w/ max assist x1 PT Short Term Goal 3 (Week 1): Pt will initiate gait training PT Short Term Goal 4 (Week 1): Pt will perform sit<>stand w/ LRAD, max assist x1  Skilled Therapeutic Interventions/Progress Updates:   Pt received supine in bed and agreeable to PT. Pt noted to have pants lowered from BM earlier day. Rolling R and L with min assist and heavy use of bed rails, while PT and Recreation therapist managed clothing.  Supine>sit transfer with max assist and max cues for proper technique and use of BUE to push into sitting from sidelying cues   Sitting balance EOB x 5 minutes with intermittent 1-2 UE support. Min cues for improved positioning to prevent L lateral lean and posterior LOB.   Stedy transfers completed x 6 throughout treatment from various heights, mod assist from elevated mat/bed and max assist to come to standing from WC height. Max assist +2 for stand>sit in stedy for safety and positioning.  Beezy board transfer to mat table with Max assist +2 with max assist for proper Board positioning and max cues to improve use of UE and increase anterior weight shift.   PT instructed pt in WC mobility x 163f with superivison-min assist in turns and moderate cues for doorway management.   Sitting balance EOB with 1-0 UE support to perform reaching tasks and UE abduction/flexion to 90 deg 2 x 10 BUE  Seated LE therex with BUE support on mat table.   Pt returned to room and performed stedy transfer to bed as listed above. Sit>supine completed with mod assist to manage  BLE. Pt left supine in bed with call bell in reach and all needs met.        Therapy Documentation Precautions:  Precautions Precautions: Fall Precaution Comments: Pacemaker placement 4 weeks ago Required Braces or Orthoses: Spinal Brace(LSO for comfort) Spinal Brace: Lumbar corset Restrictions Weight Bearing Restrictions: No    Vital Signs: Therapy Vitals Temp: 98.1 F (36.7 C) Temp Source: Oral Pulse Rate: 75 Resp: 19 BP: (!) 144/66 Patient Position (if appropriate): Lying Oxygen Therapy SpO2: 98 % O2 Device: Room Air Pain: 2/10 R hip. spasm  See Function Navigator for Current Functional Status.   Therapy/Group: Individual Therapy  ALorie Phenix6/26/2019, 2:47 PM

## 2018-05-27 NOTE — Progress Notes (Signed)
Recreational Therapy Session Note  Patient Details  Name: Erika Fry MRN: [id number] Date of Birth: Jun 24, 1946 Today's Date: 05/27/2018 Time:  4917-9150 Pain: 2/10 R hip spasm Skilled Therapeutic Interventions/Progress Updates: Met with pt briefly during co-treat with PT to discuss TR service.  Eval incomplete at this time.  Will attempt eval completion early next week if appropriate.  Therapy/Group: Co-Treatment  Kayzen Kendzierski 05/27/2018, 4:01 PM

## 2018-05-28 ENCOUNTER — Inpatient Hospital Stay (HOSPITAL_COMMUNITY): Payer: Medicare Other

## 2018-05-28 ENCOUNTER — Inpatient Hospital Stay (HOSPITAL_COMMUNITY): Payer: Medicare Other | Admitting: Physical Therapy

## 2018-05-28 ENCOUNTER — Other Ambulatory Visit (HOSPITAL_COMMUNITY): Payer: Medicare Other

## 2018-05-28 ENCOUNTER — Inpatient Hospital Stay (HOSPITAL_COMMUNITY): Payer: Medicare Other | Admitting: Occupational Therapy

## 2018-05-28 DIAGNOSIS — R400 Somnolence: Secondary | ICD-10-CM

## 2018-05-28 DIAGNOSIS — G9340 Encephalopathy, unspecified: Secondary | ICD-10-CM

## 2018-05-28 DIAGNOSIS — R0602 Shortness of breath: Secondary | ICD-10-CM

## 2018-05-28 LAB — BASIC METABOLIC PANEL
Anion gap: 8 (ref 5–15)
BUN: 48 mg/dL — AB (ref 8–23)
CHLORIDE: 104 mmol/L (ref 98–111)
CO2: 21 mmol/L — ABNORMAL LOW (ref 22–32)
CREATININE: 2.02 mg/dL — AB (ref 0.44–1.00)
Calcium: 9 mg/dL (ref 8.9–10.3)
GFR calc Af Amer: 27 mL/min — ABNORMAL LOW (ref >60)
GFR calc non Af Amer: 23 mL/min — ABNORMAL LOW (ref >60)
GLUCOSE: 121 mg/dL — AB (ref 70–99)
POTASSIUM: 4.8 mmol/L (ref 3.5–5.1)
SODIUM: 133 mmol/L — AB (ref 135–145)

## 2018-05-28 LAB — CBC
HEMATOCRIT: 25.9 % — AB (ref 36.0–46.0)
HEMOGLOBIN: 8.1 g/dL — AB (ref 12.0–15.0)
MCH: 29.2 pg (ref 26.0–34.0)
MCHC: 31.3 g/dL (ref 30.0–36.0)
MCV: 93.5 fL (ref 78.0–100.0)
Platelets: 180 10*3/uL (ref 150–400)
RBC: 2.77 MIL/uL — ABNORMAL LOW (ref 3.87–5.11)
RDW: 15.9 % — AB (ref 11.5–15.5)
WBC: 9.2 10*3/uL (ref 4.0–10.5)

## 2018-05-28 LAB — GLUCOSE, CAPILLARY
GLUCOSE-CAPILLARY: 136 mg/dL — AB (ref 70–99)
Glucose-Capillary: 100 mg/dL — ABNORMAL HIGH (ref 70–99)
Glucose-Capillary: 124 mg/dL — ABNORMAL HIGH (ref 70–99)
Glucose-Capillary: 140 mg/dL — ABNORMAL HIGH (ref 70–99)

## 2018-05-28 LAB — CK: Total CK: 132 U/L (ref 38–234)

## 2018-05-28 MED ORDER — OXYCODONE HCL 5 MG PO TABS
5.0000 mg | ORAL_TABLET | Freq: Two times a day (BID) | ORAL | Status: DC
  Administered 2018-05-31 – 2018-06-22 (×46): 5 mg via ORAL
  Filled 2018-05-28 (×46): qty 1

## 2018-05-28 MED ORDER — BACLOFEN 5 MG HALF TABLET: 5.0000 mg | ORAL_TABLET | Freq: Every day | ORAL | Status: DC

## 2018-05-28 MED ORDER — NALOXONE HCL 0.4 MG/ML IJ SOLN
0.4000 mg | INTRAMUSCULAR | Status: DC | PRN
  Administered 2018-05-28: 0.4 mg via INTRAVENOUS

## 2018-05-28 MED ORDER — NALOXONE HCL 0.4 MG/ML IJ SOLN
INTRAMUSCULAR | Status: AC
  Administered 2018-05-28: 12:00:00
  Filled 2018-05-28: qty 1

## 2018-05-28 MED ORDER — GABAPENTIN 300 MG PO CAPS: 300.0000 mg | ORAL_CAPSULE | Freq: Two times a day (BID) | ORAL | Status: DC

## 2018-05-28 MED ORDER — FUROSEMIDE 40 MG PO TABS
60.0000 mg | ORAL_TABLET | Freq: Once | ORAL | Status: AC
  Administered 2018-05-28: 60 mg via ORAL
  Filled 2018-05-28: qty 1

## 2018-05-28 MED ORDER — FUROSEMIDE 10 MG/ML IJ SOLN: 60.0000 mg | Freq: Once | INTRAMUSCULAR | Status: DC

## 2018-05-28 MED ORDER — FUROSEMIDE 40 MG PO TABS
80.0000 mg | ORAL_TABLET | Freq: Every day | ORAL | Status: DC
  Filled 2018-05-28: qty 2

## 2018-05-28 NOTE — Progress Notes (Signed)
Physical Therapy Session Note  Patient Details  Name: Erika Fry MRN: [id number] Date of Birth: Apr 27, 1946  Today's Date: 05/28/2018 PT Individual Time:  -  PT Amount of Missed Time (min): 75 Minutes PT Missed Treatment Reason: Patient fatigue(lethargy)  Short Term Goals: Week 1:  PT Short Term Goal 1 (Week 1): Pt will tolerate 1 hour of sitting up in chair w/ minimal increase in pain or fatigue PT Short Term Goal 2 (Week 1): Pt will transfer bed<>chair w/ max assist x1 PT Short Term Goal 3 (Week 1): Pt will initiate gait training PT Short Term Goal 4 (Week 1): Pt will perform sit<>stand w/ LRAD, max assist x1  Skilled Therapeutic Interventions/Progress Updates:    Attempted to see patient for scheduled therapy session. Pt is supine in bed asleep and unable to be aroused even with sternal rubbing. Per previous PT and NA report pt has been lethargic this date due to starting a new medication, vitals WNL. Will attempt to see patient tomorrow per POC.  Therapy Documentation Precautions:  Precautions Precautions: Fall Precaution Comments: Pacemaker placement 4 weeks ago Required Braces or Orthoses: Spinal Brace(LSO for comfort) Spinal Brace: Lumbar corset Restrictions Weight Bearing Restrictions: No General: PT Amount of Missed Time (min): 75 Minutes PT Missed Treatment Reason: Patient fatigue(lethargy)  See Function Navigator for Current Functional Status.   Therapy/Group: Individual Therapy  Excell Seltzer, PT, DPT  05/28/2018, 3:39 PM

## 2018-05-28 NOTE — Procedures (Addendum)
History: 72 year old female being evaluated for somnolence and myoclonus  Sedation: Gabapentin, oxycodone  Technique: This is a 21 channel routine scalp EEG performed at the bedside with bipolar and monopolar montages arranged in accordance to the international 10/20 system of electrode placement. One channel was dedicated to EKG recording.    Background: The background consists of generalized irregular delta and theta activities.  There was no definite posterior dominant rhythm observed during the study.  There were some myoclonic jerks observed without EEG correlate.  Photic stimulation: Physiologic driving is not performed  EEG Abnormalities: 1) generalized irregular slow activity 2) absent PDR  Clinical Interpretation: This EEG is consistent with a moderate nonspecific generalized cerebral dysfunction (encephalopathy).  There were no epileptiform discharges associated with myoclonus.  There was no seizure or seizure predisposition recorded on this study. Please note that a normal EEG does not preclude the possibility of epilepsy.   Roland Rack, MD [facility] [phone]  If 7pm- 7am, please page neurology on call as listed in [place].

## 2018-05-28 NOTE — Progress Notes (Signed)
Physical Therapy Session Note  Patient Details  Name: Erika Fry MRN: [id number] Date of Birth: [birth year]  Today's Date: 05/28/2018     Short Term Goals: Week 1:  PT Short Term Goal 1 (Week 1): Pt will tolerate 1 hour of sitting up in chair w/ minimal increase in pain or fatigue PT Short Term Goal 2 (Week 1): Pt will transfer bed<>chair w/ max assist x1 PT Short Term Goal 3 (Week 1): Pt will initiate gait training PT Short Term Goal 4 (Week 1): Pt will perform sit<>stand w/ LRAD, max assist x1  Skilled Therapeutic Interventions/Progress Updates:   Pt received supine in bed and agreeable to PT. Pt noted to have little to no response to verbal stimulation upon PT entering room. PT attempted to arouse pt with tactile stimulation, but only mild involuntary response noted from pt. Due to decreased arousal on this day, PT will re-attempt therapy at later time/date provided pt is arousable and medically appropriate from PT.      Therapy Documentation Precautions:  Precautions Precautions: Fall Precaution Comments: Pacemaker placement 4 weeks ago Required Braces or Orthoses: Spinal Brace(LSO for comfort) Spinal Brace: Lumbar corset Restrictions Weight Bearing Restrictions: No General:  missed time: 60 min : lethargy Vital Signs: Therapy Vitals Pulse Rate: 90 BP: (!) 197/78  See Function Navigator for Current Functional Status.   Therapy/Group: Individual Therapy  [name] 05/28/2018, 8:06 AM

## 2018-05-28 NOTE — Progress Notes (Signed)
EEG complete - results pending 

## 2018-05-28 NOTE — Progress Notes (Signed)
Called to room by nursing for patient's somnolence.  She does respond to sternal rub.  She does have some myoclonic jerking that has already been reported previously.  Felt to be possibly medication induced with baclofen and Neurontin discontinued.  Patient did receive Narcan x1.  Vital signs currently stable.  Continue to monitor with vital signs every 15 minutes x 1 hour

## 2018-05-28 NOTE — Progress Notes (Signed)
Discuss with PA, please see PA notes.

## 2018-05-28 NOTE — Patient Care Conference (Signed)
Inpatient RehabilitationTeam Conference and Plan of Care Update Date: 05/27/2018   Time: 11:30 AM    Patient Name: Erika Fry      Medical Record Number: [id number]  Date of Birth: 1946/06/08 Sex: Female         Room/Bed: 4M12C/4M12C-01 Payor Info: Payor: [employer] park [employer] MEDICARE / Plan: [facility] MEDICARE / Product Type: *No Product type* /    Admitting Diagnosis: Debility Osteomyelins  Admit Date/Time:  05/22/2018  2:57 PM Admission Comments: No comment available   Primary Diagnosis:  Diskitis Principal Problem: Diskitis  Patient Active Problem List   Diagnosis Date Noted  . SOB (shortness of breath)   . Somnolence   . Hyperkalemia   . Edema of hand   . Acute right-sided low back pain with right-sided sciatica   . Radicular pain   . Acute blood loss anemia   . Anemia of chronic disease   . Thrombocytopenia ([place])   . New onset type 2 diabetes mellitus ([place])   . Morbid obesity ([place])   . Hyponatremia   . Benign essential HTN   . Hypoalbuminemia due to protein-calorie malnutrition ([place])   . Diskitis 05/22/2018  . Occult blood in stools   . Gastric polyp   . Lumbar discitis 05/17/2018  . Blood loss anemia 05/12/2018  . Hyperlipidemia associated with type 2 diabetes mellitus ([place]) 05/10/2018  . Essential hypertension 04/29/2018  . CKD (chronic kidney disease), stage IV ([place])   . Diet-controlled diabetes mellitus ([place])   . GERD (gastroesophageal reflux disease)   . Gout   . High cholesterol   . Hypothyroidism   . Iron deficiency anemia   . AV block, Mobitz 2 03/25/2018  . Mobitz type 2 second degree AV block 03/20/2018  . Aortic stenosis 03/20/2018  . Exertional dyspnea 03/19/2018    Expected Discharge Date: Expected Discharge Date: 06/19/18  Team Members Present: Physician leading conference: Dr. Delice Lesch Social Worker Present: Lennart Pall, LCSW Nurse Present: Junius Creamer, RN PT Present: Barrie Folk, PT OT Present: Willeen Cass, OT SLP Present: Windell Moulding, SLP     Current Status/Progress Goal Weekly Team Focus  Medical   Functional deficits secondary to L4-5 osteomyelitis/discitis, lumbar spondylosis   Improve mobility, pain, CBGs, electrolytes, HTN  See above   Bowel/Bladder   Continent of bowel and bladder. Bedpen use.  Mod assist with management of b/b [facility]  transfer methods and self care   Swallow/Nutrition/ Hydration             ADL's   total assist dressing, +2 LB dressing at sit > stand level with Stedy, use of Stedy for transfers  Min assist  OOB tolerance, activity tolerance, pain management, toilet transfers, increased active participation in self-care tasks, ADL retraining   Mobility   minA rolling L/R, modA sit to/from supine, maxA x 2 sit to stand with Stedy, minA wc mobility for short distances as quickly fatigued  Min assist short distance gait  OOB tolerance, transfers, w/c mobility, BLE strengthening, pain management,    Communication             Safety/Cognition/ Behavioral Observations            Pain   pain to BL hips and back. scheduled Oxycodone 5mg  BID, PRN oxycodone q6hrs, tylenol q4 PRN, and Ultram PRN  4 or less  medicate as scheduled and PRN to manage pain   Skin   Scattered bruising noted to abdomen and extermities.   free of skin breakdown  min assist  assess skin q shift     Rehab Goals Patient on target to meet rehab goals: Yes *See Care Plan and progress notes for long and short-term goals.     Barriers to Discharge  Current Status/Progress Possible Resolutions Date Resolved   Physician    Medical stability;Weight;New diabetic     See above  Therapies, follow labs, optimize pain meds, optimize DM/HTN meds      Nursing                  PT                    OT                  SLP                SW                Discharge Planning/Teaching Needs:  Pt to d/c home with daughter-in-law, [name], to be primary caregiver.  [employer] to cover 24/7 assistance.      Team Discussion:  MD  monitoring pain/ spasms; new DM meds; swelling.  Pt having tremors and spasms - medications added.  Needs encouragement to complete full therapy day.  Got up to the toilet today and need to continue to encourage this.  Min assist goals overall.  Currently max assist just to stand. Very, very deconditioned.  Revisions to Treatment Plan:  None    Continued Need for Acute Rehabilitation Level of Care: The patient requires daily medical management by a physician with specialized training in physical medicine and rehabilitation for the following conditions: Daily direction of a multidisciplinary physical rehabilitation program to ensure safe treatment while eliciting the highest outcome that is of practical value to the patient.: Yes Daily medical management of patient stability for increased activity during participation in an intensive rehabilitation regime.: Yes Daily analysis of laboratory values and/or radiology reports with any subsequent need for medication adjustment of medical intervention for : Neurological problems;Diabetes problems;Blood pressure problems;Renal problems;Other  Juliahna Wiswell 05/29/2018, 9:43 AM

## 2018-05-28 NOTE — Progress Notes (Signed)
Subjective/Complaints: Patient seen sitting up in bed this morning. She states she slept too well overnight and feels groggy this morning. She repeats the same thing over and over.  ROS: + fatigue. denies CP, SOB, Neg N/V/D  Objective: Vital Signs: Blood pressure (!) 197/78, pulse 90, temperature 99 F (37.2 C), temperature source Oral, resp. rate 18, height 5' 5" (1.651 m), weight 105.9 kg (233 lb 7.5 oz), SpO2 99 %. No results found. Results for orders placed or performed during the hospital encounter of 05/22/18 (from the past 72 hour(s))  Glucose, capillary     Status: Abnormal   Collection Time: 05/25/18 11:42 AM  Result Value Ref Range   Glucose-Capillary 176 (H) 65 - 99 mg/dL  Glucose, capillary     Status: Abnormal   Collection Time: 05/25/18  4:51 PM  Result Value Ref Range   Glucose-Capillary 257 (H) 65 - 99 mg/dL  Glucose, capillary     Status: Abnormal   Collection Time: 05/25/18  9:05 PM  Result Value Ref Range   Glucose-Capillary 182 (H) 65 - 99 mg/dL  Glucose, capillary     Status: Abnormal   Collection Time: 05/26/18  6:46 AM  Result Value Ref Range   Glucose-Capillary 166 (H) 70 - 99 mg/dL  Glucose, capillary     Status: Abnormal   Collection Time: 05/26/18 11:39 AM  Result Value Ref Range   Glucose-Capillary 146 (H) 70 - 99 mg/dL  Glucose, capillary     Status: Abnormal   Collection Time: 05/26/18  4:45 PM  Result Value Ref Range   Glucose-Capillary 182 (H) 70 - 99 mg/dL  Glucose, capillary     Status: Abnormal   Collection Time: 05/26/18 10:08 PM  Result Value Ref Range   Glucose-Capillary 132 (H) 70 - 99 mg/dL  Basic metabolic panel     Status: Abnormal   Collection Time: 05/27/18  4:20 AM  Result Value Ref Range   Sodium 133 (L) 135 - 145 mmol/L   Potassium 5.2 (H) 3.5 - 5.1 mmol/L   Chloride 102 98 - 111 mmol/L    Comment: Please note change in reference range.   CO2 23 22 - 32 mmol/L   Glucose, Bld 126 (H) 70 - 99 mg/dL    Comment: Please note  change in reference range.   BUN 48 (H) 8 - 23 mg/dL    Comment: Please note change in reference range.   Creatinine, Ser 2.18 (H) 0.44 - 1.00 mg/dL   Calcium 9.3 8.9 - 10.3 mg/dL   GFR calc non Af Amer 21 (L) >60 mL/min   GFR calc Af Amer 24 (L) >60 mL/min    Comment: (NOTE) The eGFR has been calculated using the CKD EPI equation. This calculation has not been validated in all clinical situations. eGFR's persistently <60 mL/min signify possible Chronic Kidney Disease.    Anion gap 8 5 - 15    Comment: Performed at [facility] [address] [place], [place] [address]  Glucose, capillary     Status: Abnormal   Collection Time: 05/27/18  6:49 AM  Result Value Ref Range   Glucose-Capillary 118 (H) 70 - 99 mg/dL  Glucose, capillary     Status: Abnormal   Collection Time: 05/27/18 11:51 AM  Result Value Ref Range   Glucose-Capillary 141 (H) 70 - 99 mg/dL  Glucose, capillary     Status: Abnormal   Collection Time: 05/27/18  4:49 PM  Result Value Ref Range   Glucose-Capillary  162 (H) 70 - 99 mg/dL  Glucose, capillary     Status: Abnormal   Collection Time: 05/27/18 10:12 PM  Result Value Ref Range   Glucose-Capillary 161 (H) 70 - 99 mg/dL  Basic metabolic panel     Status: Abnormal   Collection Time: 05/28/18  4:41 AM  Result Value Ref Range   Sodium 133 (L) 135 - 145 mmol/L   Potassium 4.8 3.5 - 5.1 mmol/L   Chloride 104 98 - 111 mmol/L    Comment: Please note change in reference range.   CO2 21 (L) 22 - 32 mmol/L   Glucose, Bld 121 (H) 70 - 99 mg/dL    Comment: Please note change in reference range.   BUN 48 (H) 8 - 23 mg/dL    Comment: Please note change in reference range.   Creatinine, Ser 2.02 (H) 0.44 - 1.00 mg/dL   Calcium 9.0 8.9 - 10.3 mg/dL   GFR calc non Af Amer 23 (L) >60 mL/min   GFR calc Af Amer 27 (L) >60 mL/min    Comment: (NOTE) The eGFR has been calculated using the CKD EPI equation. This calculation has not been validated in all clinical  situations. eGFR's persistently <60 mL/min signify possible Chronic Kidney Disease.    Anion gap 8 5 - 15    Comment: Performed at [facility] [address] [place], [place] [address]  CK     Status: None   Collection Time: 05/28/18  4:41 AM  Result Value Ref Range   Total CK 132 38 - 234 U/L    Comment: Performed at [facility], [place] [address] [place], [place] [address]  CBC     Status: Abnormal   Collection Time: 05/28/18  4:41 AM  Result Value Ref Range   WBC 9.2 4.0 - 10.5 K/uL   RBC 2.77 (L) 3.87 - 5.11 MIL/uL   Hemoglobin 8.1 (L) 12.0 - 15.0 g/dL   HCT 25.9 (L) 36.0 - 46.0 %   MCV 93.5 78.0 - 100.0 fL   MCH 29.2 26.0 - 34.0 pg   MCHC 31.3 30.0 - 36.0 g/dL   RDW 15.9 (H) 11.5 - 15.5 %   Platelets 180 150 - 400 K/uL    Comment: Performed at [facility], [place] [address] [place], [place] [address]  Glucose, capillary     Status: Abnormal   Collection Time: 05/28/18  7:03 AM  Result Value Ref Range   Glucose-Capillary 100 (H) 70 - 99 mg/dL     Constitutional: No distress . Vital signs reviewed. HENT: Normocephalic.  Atraumatic. Eyes: EOMI. No discharge. Cardiovascular: RRR. No JVD. Respiratory: CTA Bilaterally.  Normal effort. GI: BS +. Non-distended. Musc: generalized edema Neuro: Alert and Oriented Motor: Bilateral upper extremities: 4+/5 proximal distal Bilateral lower extremities: Hip flexion 1+/5, knee extension 3/5, ankle dorsiflexion 5/5 (left stronger than right, unchanged) Tremors noted Skin: Warm and dry. Intact. Psych: Normal behavior and normal affect.  Assessment/Plan: 1. Functional deficits secondary to L4-5 Lumbar discitis which require 3+ hours per day of interdisciplinary therapy in a comprehensive inpatient rehab setting. Physiatrist is providing close team supervision and 24 hour management of active medical problems listed below. Physiatrist and rehab team continue to assess barriers to discharge/monitor patient progress  toward functional and medical goals. FIM: Function - Bathing Bathing activity did not occur: Refused  Function- Upper Body Dressing/Undressing Upper body dressing/undressing activity did not occur: Refused What is the patient wearing?: Orthosis Pull  over shirt/dress - Perfomed by helper: Thread/unthread right sleeve, Thread/unthread left sleeve, Put head through opening, Pull shirt over trunk Orthosis activity level: Performed by helper Function - Lower Body Dressing/Undressing Lower body dressing/undressing activity did not occur: Refused What is the patient wearing?: Pants Position: Sitting EOB Pants- Performed by helper: Thread/unthread right pants leg, Thread/unthread left pants leg, Pull pants up/down Assist for lower body dressing: 2 Helpers  Function - Toileting Toileting steps completed by helper: Adjust clothing prior to toileting, Performs perineal hygiene, Adjust clothing after toileting Assist level: Two helpers  Function - Air cabin crew transfer activity did not occur: Risk analyst transfer assistive device: Elevated toilet seat/BSC over toilet(STEDY +2) Assist level to toilet: Maximal assist (Pt 25 - 49%/lift and lower) Assist level from toilet: Maximal assist (Pt 25 - 49%/lift and lower)  Function - Chair/bed transfer Chair/bed transfer activity did not occur: Refused Chair/bed transfer method: Lateral scoot, Other Chair/bed transfer assist level: 2 helpers  Function - Locomotion: Wheelchair Type: Educational psychologist activity did not occur: Refused Max wheelchair distance: 125 Assist Level: Touching or steadying assistance (Pt > 75%) Wheel 50 feet with 2 turns activity did not occur: Refused Assist Level: Touching or steadying assistance (Pt > 75%) Wheel 150 feet activity did not occur: Refused Function - Locomotion: Ambulation Ambulation activity did not occur: Safety/medical concerns Walk 10 feet activity did not occur: Safety/medical  concerns Walk 50 feet with 2 turns activity did not occur: Safety/medical concerns Walk 150 feet activity did not occur: Safety/medical concerns Walk 10 feet on uneven surfaces activity did not occur: Safety/medical concerns  Function - Comprehension Comprehension: Auditory Comprehension assist level: Follows complex conversation/direction with no assist  Function - Expression Expression: Verbal Expression assist level: Expresses complex ideas: With no assist  Function - Social Interaction Social Interaction assist level: Interacts appropriately with others - No medications needed.  Function - Problem Solving Problem solving assist level: Solves complex problems: Recognizes & self-corrects  Function - Memory Memory assist level: Complete Independence: No helper Patient normally able to recall (first 3 days only): Current season, Staff names and faces, That he or she is in a hospital  Medical Problem List and Plan:  1. Functional deficits secondary to L4-5 osteomyelitis/discitis, lumbar spondylosis   Continue CIR 2. DVT Prophylaxis/Anticoagulation: Mechanical: Sequential compression devices, below knee Bilateral lower extremities   Doppler negative for DVT in LE  LUE doppler ordered to evaluate for DVT, negative 3. Pain Management: Scheduled oxycodone prior to therapy to help with activity tolerance, decreased to 5 mg on 6/27.   Added ultram prn for moderate pain. Monitor and titrate medications to help with neuropathic symptoms. Decadron d/ced.   C/o RLE radicular pain, partially improved on gabapentin to 629m BID, decreased to 300 mg twice a day   Low dose dexamethasone DC'd on 6/26  Baclofen changed to 5 mg daily at bedtime, patient unable to tolerate higher doses 4. Mood: LCSW to follow for evaluation and support.  5. Neuropsych: This patient is capable of making decisions on her own behalf.  6. Skin/Wound Care: routine pressure relief measures  7.  Fluids/Electrolytes/Nutrition: Monitor I/O.   8. Lumbar diskitis: Afebrile on scheduled tylenol. On Daptomycin and ceftriaxone. Steroids may make it more difficult to fight infx, pt very uncomfortable and restarted low dose for limited time, DC'd on 6/26 9. Acute on chronic anemia: Treated with iron and procrit on outpatient basis. Baseline Hgb- 9-10 range? Will continue to monitor serially   Hemoglobin 8.1 on 6/27  Hemoccult ordered  Continue to monitor 10. Symptomatic Bradycardia s/p PPM: Monitor HR bid.   Controlled at present 11. GIB with thrombocytopenia: Continue to monitor H/H serially for stability.   Platelets 180 on 6/27  Cont to monitor 12. New diagnosis T2DM: Hgb A1c- 6.3. Poorly controlled due to recent rounds of steroids--Lantus was titrated upwards for better control. Was not on any medications at home. Monitor BS ac/hs--watch for hypoglycemic episodes as steroids tapered off.  CBG (last 3)  Recent Labs    05/27/18 1649 05/27/18 2212 05/28/18 0703  GLUCAP 162* 161* 100*   Improving on 6/2, will consider further adjustments after steroids DC'd if necessary 13. Neuropathy BLE: Continue Gabapentin bid--monitor for SE.  14. Hyponatremia: Question due to intake. Added protein supplements with meals. monitor for now.   Sodium 133 on 6/27  Continue to monitor 15. Stage IV CKD: Baseline SCr 3.5-4.0. Monitor for SE/asterixis with narcotics.   Creatinine 2.02 on 6/27  Encourage fluids  Continue to monitor 16. Fluid overload: Monitor daily weights and for signs of overload.   Lasix increased to 80 mg daily on 6/27 (PTCA 80 twice a day) Filed Weights   05/22/18 1607 05/26/18 0600 05/27/18 0500  Weight: 103.1 kg (227 lb 6.4 oz) 101.1 kg (222 lb 14.2 oz) 105.9 kg (233 lb 7.5 oz)  17. Morbid obesity  Encourage weight loss 18. Essential hypertension  Continue Norvasc 10 mg daily  Hydralazine 50 mg 3 times a day, increased to 75 on 6/24  Hypertensive crisis this AM, likely due to  other medical issues 19. Hypoalbuminemia  Supplement initiated 20. Hyperkalemia  Potassium 4.8 on 6/27  Continue to monitor  LOS (Days) 6 A FACE TO FACE EVALUATION WAS PERFORMED   [name] 05/28/2018, 9:00 AM

## 2018-05-28 NOTE — Progress Notes (Signed)
Patient more arousable but still somnolent.  Will check cranial CT scan rule out any acute intracranial process.

## 2018-05-28 NOTE — Progress Notes (Signed)
Occupational Therapy Session Note  Patient Details  Name: Erika Fry MRN: [id number] Date of Birth: Jan 08, 1946  Today's Date: 05/28/2018 OT Individual Time: 7096-2836 OT Individual Time Calculation (min): 77 min    Short Term Goals: Week 1:  OT Short Term Goal 1 (Week 1): Pt will report no more than 5/10 pain in order to participate more functionally in ADLs OT Short Term Goal 2 (Week 1): Pt will sit EOB for >10 min with no more than mod A to participate in morning ADL routine OT Short Term Goal 3 (Week 1): Pt will don shirt with min A  OT Short Term Goal 4 (Week 1): Pt will adhere to back precautions during bed mobility with no more than 2 vc   Skilled Therapeutic Interventions/Progress Updates:    Treatment session focused on ADLs/self care training, pain management, therapeutic relaxation techniques, and pt education/cognitive training. Due to adverse effects of medication administered lat night by nsg, pt noted to have difficulty recalling information, "controlling her body", holding on to this, and experiencing delirium. Nursing has been aware and since medication has been discontinued. Throughout session patient weeping often and repeated negative comments including "I can't move my body" and "I can't control anything." However when asked to perform face and perineal washing in bed, pt engaged with minimal prompting. Pt completed bed rolling with mod A x 2 and required total A for LB d/b in bed. Pt required continual encouragement by therapist for active participation. Therapist instructed pt on deep breathing and positive visualization to engage pt in motivation to return home. Pt required max A x 2 move from supine lying to EOB with assist to UB/LB. Therapist provided v/c for hand/foot placement and torso control while sitting upright to maintain posture. Pt required mod-max A for UB balance and trunk control through UB d/b task for up to 5 minutes with total assist.  Pt returned to  supine lying with max A x 2 and repositioned in bed for maximal comfort. Pt left resting with call bell within reach and bed alarm on. Throughout session pt continued to report pain 10/10 in R hip with movement, particularly sitting EOB. Therapist educated pt on importance of relaxation techniques to divert attention on pain. Continue to work on overall endurance deficits.    Therapy Documentation Precautions:  Precautions Precautions: Fall Precaution Comments: Pacemaker placement 4 weeks ago Required Braces or Orthoses: Spinal Brace(LSO for comfort) Spinal Brace: Lumbar corset Restrictions Weight Bearing Restrictions: No General: General OT Amount of Missed Time: 13 Minutes Vital Signs: Therapy Vitals Pulse Rate: 90 BP: (!) 197/78 Pain: Pain Assessment Pain Score: 9  Pain Type: Chronic pain Pain Location: Hip Pain Orientation: Right Pain Descriptors / Indicators: Aching;Spasm Pain Onset: With Activity Patients Stated Pain Goal: 1 Pain Intervention(s): Repositioned ADL: ADL ADL Comments: See functional navigator  See Function Navigator for Current Functional Status.   Therapy/Group: Individual Therapy  Delon [place] 05/28/2018, 9:52 AM

## 2018-05-28 NOTE — Progress Notes (Signed)
[place] for Daptomycin Indication: Lumbar discitis/osteo    Allergies  Allergen Reactions  . Codeine Other (See Comments)    Increases Pain and couldn't sleep    Patient Measurements: Height: 5\' 5"  (165.1 cm) Weight: 233 lb 7.5 oz (105.9 kg) IBW/kg (Calculated) : 57 Adjusted Body Weight:   Vital Signs: BP: 197/78 (06/27 0708) Pulse Rate: 90 (06/27 0708) Intake/Output from previous day: 06/26 0701 - 06/27 0700 In: 240 [P.O.:240] Out: 750 [Urine:750] Intake/Output from this shift: No intake/output data recorded.  Labs: Recent Labs    05/27/18 0420 05/28/18 0441  WBC  --  9.2  HGB  --  8.1*  PLT  --  180  CREATININE 2.18* 2.02*   Estimated Creatinine Clearance: 29.5 mL/min (A) (by C-G formula based on SCr of 2.02 mg/dL (H)). No results for input(s): VANCOTROUGH, VANCOPEAK, VANCORANDOM, GENTTROUGH, GENTPEAK, GENTRANDOM, TOBRATROUGH, TOBRAPEAK, TOBRARND, AMIKACINPEAK, AMIKACINTROU, AMIKACIN in the last 72 hours.    Assessment: ID: Osteomyelitis / lumbar discitis. Afebrile. WBC WNL  6/14: image-guided lumbar 4-lumbar 5 disc aspiration. PICC 6/17 - For 6 weeks of therapy.  Scr 2  Zosyn 6/12>>6/16 CTX 6/16 >> (7/24) Vanco 6/12 >> 6/18 Dapto 6/18 >(7/24)  - 5/29: CK 81 -6/21: CK 139  6/14 Fungus Cx > Pending 6/14 Wound/Lumbar Aspirate Cx > ngtd, prelim 6/15 Blood Cx > ngtd  6/12 MRI: Findings concerning for acute osteomyelitis discitis involving the right aspect of the L4-5 interspace as above.   Plan:  -Continue Daptomycin 8mg /kg q 24h  -CTX 2g q24h per MD -CK weekly on Dapto   Thank you Anette Guarneri, PharmD [phone]  05/28/2018,8:20 AM

## 2018-05-29 ENCOUNTER — Inpatient Hospital Stay (HOSPITAL_COMMUNITY): Payer: Medicare Other | Admitting: Physical Therapy

## 2018-05-29 ENCOUNTER — Inpatient Hospital Stay (HOSPITAL_COMMUNITY): Payer: Medicare Other | Admitting: Occupational Therapy

## 2018-05-29 DIAGNOSIS — R400 Somnolence: Secondary | ICD-10-CM

## 2018-05-29 LAB — COMPREHENSIVE METABOLIC PANEL
ALT: 35 U/L (ref 0–44)
ANION GAP: 11 (ref 5–15)
AST: 77 U/L — ABNORMAL HIGH (ref 15–41)
Albumin: 2.3 g/dL — ABNORMAL LOW (ref 3.5–5.0)
Alkaline Phosphatase: 102 U/L (ref 38–126)
BILIRUBIN TOTAL: 0.6 mg/dL (ref 0.3–1.2)
BUN: 42 mg/dL — ABNORMAL HIGH (ref 8–23)
CALCIUM: 9.6 mg/dL (ref 8.9–10.3)
CO2: 24 mmol/L (ref 22–32)
Chloride: 105 mmol/L (ref 98–111)
Creatinine, Ser: 1.86 mg/dL — ABNORMAL HIGH (ref 0.44–1.00)
GFR calc Af Amer: 30 mL/min — ABNORMAL LOW (ref >60)
GFR calc non Af Amer: 26 mL/min — ABNORMAL LOW (ref >60)
Glucose, Bld: 128 mg/dL — ABNORMAL HIGH (ref 70–99)
POTASSIUM: 4 mmol/L (ref 3.5–5.1)
Sodium: 140 mmol/L (ref 135–145)
Total Protein: 5.5 g/dL — ABNORMAL LOW (ref 6.5–8.1)

## 2018-05-29 LAB — CBC
HEMATOCRIT: 29 % — AB (ref 36.0–46.0)
Hemoglobin: 9.2 g/dL — ABNORMAL LOW (ref 12.0–15.0)
MCH: 29.4 pg (ref 26.0–34.0)
MCHC: 31.7 g/dL (ref 30.0–36.0)
MCV: 92.7 fL (ref 78.0–100.0)
Platelets: 156 10*3/uL (ref 150–400)
RBC: 3.13 MIL/uL — ABNORMAL LOW (ref 3.87–5.11)
RDW: 16.1 % — AB (ref 11.5–15.5)
WBC: 12 10*3/uL — ABNORMAL HIGH (ref 4.0–10.5)

## 2018-05-29 LAB — T4, FREE: Free T4: 0.9 ng/dL (ref 0.82–1.77)

## 2018-05-29 LAB — GLUCOSE, CAPILLARY
GLUCOSE-CAPILLARY: 121 mg/dL — AB (ref 70–99)
GLUCOSE-CAPILLARY: 117 mg/dL — AB (ref 70–99)
Glucose-Capillary: 118 mg/dL — ABNORMAL HIGH (ref 70–99)

## 2018-05-29 LAB — TSH: TSH: 0.706 u[IU]/mL (ref 0.350–4.500)

## 2018-05-29 MED ORDER — ACETAMINOPHEN 650 MG RE SUPP: 325.0000 mg | RECTAL | Status: DC | PRN

## 2018-05-29 MED ORDER — FUROSEMIDE 10 MG/ML IJ SOLN
80.0000 mg | Freq: Every day | INTRAMUSCULAR | Status: DC
  Administered 2018-05-29 – 2018-06-03 (×6): 80 mg via INTRAVENOUS
  Filled 2018-05-29 (×6): qty 8

## 2018-05-29 MED ORDER — SODIUM CHLORIDE 0.45 % IV SOLN
INTRAVENOUS | Status: DC
  Administered 2018-05-29 – 2018-05-31 (×4): via INTRAVENOUS

## 2018-05-29 MED ORDER — CHLORHEXIDINE GLUCONATE CLOTH 2 % EX PADS
6.0000 | MEDICATED_PAD | Freq: Every day | CUTANEOUS | Status: DC
  Administered 2018-05-29 – 2018-06-21 (×20): 6 via TOPICAL

## 2018-05-29 NOTE — Progress Notes (Signed)
Daughter in law at bedside, pt continues to sleep. Staff will continue to monitor and meet needs.

## 2018-05-29 NOTE — Progress Notes (Signed)
Physical Therapy Session Note  Patient Details  Name: Erika Fry MRN: [id number] Date of Birth: 11/07/1946  Today's Date: 05/29/2018 PT Individual Time: 1000-1030 AND 1400-1530 PT Individual Time Calculation (min): 30 min AND 30 min   Short Term Goals: Week 1:  PT Short Term Goal 1 (Week 1): Pt will tolerate 1 hour of sitting up in chair w/ minimal increase in pain or fatigue PT Short Term Goal 2 (Week 1): Pt will transfer bed<>chair w/ max assist x1 PT Short Term Goal 3 (Week 1): Pt will initiate gait training PT Short Term Goal 4 (Week 1): Pt will perform sit<>stand w/ LRAD, max assist x1  Skilled Therapeutic Interventions/Progress Updates:   Pt received supine in bed and agreeable to PT. Pt arousable to painful stimuli on this day and able to attempt to answer questions. Pt noted to attempt to say "I don't know" to orientation questions, but difficult to understand with minimal use of facial muscles to vocalize. Pt able to keep eyes open for up to 10sec before returning to somnolent state 3-4 times throughout PT. PT performed vitals assessment automatically in the R forearm 209/84, HR 99. SpO2 94%. PT notified RN; BP assessed manually by RN 175/88. PT deferred OOB activity due to significant elevation of BP and poor pt presentation.  Pt noted have labored breathing throughout PT treatment. PT attempted to instruct pt in UE and LE AAROM with only mild bouts of arousal and no intentional activation of UE or LE. Due to lack of increased arousal with UE and LE movements, PT decided to hold additional treatment at this time. Pt left supine in bed with all needs met.   Session 2  PA present discussing pt presentation with family member. PT assessed BP 154/84. Pt treatment focused on increased arousal and attention to stimuli. Moderate response to sternal rub, mild focal response to babinski. Supine>sit with total +2 assist once in sitting pt note to have mild clonic like movement through  BUE/trunk and able to vocalize it hurts. Unable to keep eyes open or directly answer questions from PT to locate pain or attend to orientation questions while sitting EOB. Once returned to supine with total +2 assist, no change in pt arousal from prior to sitting task. Breathing continues to be very labored throughout treatment.  RN aware. PT spoke with PA regarding lack of arousal over the last 2 days and agrees to reduce therapy to QD until pt becomes more alert and able to participate at greater level.      Therapy Documentation Precautions:  Precautions Precautions: Fall Precaution Comments: Pacemaker placement 4 weeks ago Required Braces or Orthoses: Spinal Brace(LSO for comfort) Spinal Brace: Lumbar corset Restrictions Weight Bearing Restrictions: No General: PT Amount of Missed Time (min): 30 Minutes PT Missed Treatment Reason: Patient fatigue(lethargy ) Vital Signs: Therapy Vitals Pulse Rate: 99 BP: (!) 209/84(R forearm; Automatically ) Patient Position (if appropriate): Lying Oxygen Therapy SpO2: 94 % Pain: Pain Assessment Pain Scale: CPOT Critical Care Pain Observation Tool (CPOT) Facial Expression: Tense Body Movements: Protection Muscle Tension: Relaxed Compliance with ventilator (intubated pts.): N/A Vocalization (extubated pts.): Sighing, moaning CPOT Total: 3   See Function Navigator for Current Functional Status.   Therapy/Group: Individual Therapy  [name] 05/29/2018, 11:04 AM

## 2018-05-29 NOTE — Progress Notes (Signed)
Social Work Patient ID: Erika Fry, female   DOB: 1946/09/18, 72 y.o.   MRN: [id number]  Spoke with pt's dtr-in-law, Erika Fry, this afternoon about the current issues of sedation with pt.  Family is hopeful her arousal will improve as the medications filter out of system.  She is aware of targeted dc date of 7/19 however, will follow up again with them after next week's conference.  Continue to follow.  Ngina Royer, LCSW

## 2018-05-29 NOTE — Progress Notes (Signed)
Subjective/Complaints: Patient seen lying in bed this morning. She is difficult to arouse, but arousable. Extensive workup relatively unremarkable for acute change.  ROS: Denies CP, SOB, Neg N/V/D  Objective: Vital Signs: Blood pressure (!) 154/85, pulse 97, temperature 98.3 F (36.8 C), temperature source Oral, resp. rate 18, height 5' 5"  (1.651 m), weight 102.3 kg (225 lb 8.5 oz), SpO2 97 %. Dg Chest 1 View  Result Date: 05/28/2018 CLINICAL DATA:  Shortness of breath. EXAM: CHEST  1 VIEW COMPARISON:  March 26, 2018 FINDINGS: The distal tip of the new right central line is likely 14 mm below the caval atrial junction. Stable pacemaker. No pneumothorax. Mild opacity in left base may represent atelectasis or scar in is similar to slightly more prominent. No other changes. IMPRESSION: 1. New right central line, terminating just in the right atrium, 14 mm inferior to the caval atrial junction. 2. Mild left retrocardiac opacity, likely scarring. This opacity is very similar to the previous study. Electronically Signed   By: Dorise Bullion III M.D   On: 05/28/2018 13:48   Ct Head Wo Contrast  Result Date: 05/28/2018 CLINICAL DATA:  Initial evaluation for acute altered mental status. EXAM: CT HEAD WITHOUT CONTRAST TECHNIQUE: Contiguous axial images were obtained from the base of the skull through the vertex without intravenous contrast. COMPARISON:  None available. FINDINGS: Brain: Generalized age-related cerebral atrophy with mild chronic small vessel ischemic disease. No acute intracranial hemorrhage. No acute large vessel territory infarct. No mass lesion, midline shift or mass effect. No hydrocephalus. No extra-axial fluid collection. Vascular: No hyperdense vessel. Calcified atherosclerosis present at the skull base. Skull: Scalp soft tissues within normal limits.  Calvarium intact. Sinuses/Orbits: Globes and orbital soft tissues within normal limits. Small retention cysts noted within the right  sphenoid sinus. Paranasal sinuses are otherwise clear. Small right mastoid effusion noted. Other: None. IMPRESSION: 1. No acute intracranial abnormality. 2. Mild age-related cerebral atrophy with chronic small vessel ischemic disease. Electronically Signed   By: Jeannine Boga M.D.   On: 05/28/2018 15:48   Results for orders placed or performed during the hospital encounter of 05/22/18 (from the past 72 hour(s))  Glucose, capillary     Status: Abnormal   Collection Time: 05/26/18 11:39 AM  Result Value Ref Range   Glucose-Capillary 146 (H) 70 - 99 mg/dL  Glucose, capillary     Status: Abnormal   Collection Time: 05/26/18  4:45 PM  Result Value Ref Range   Glucose-Capillary 182 (H) 70 - 99 mg/dL  Glucose, capillary     Status: Abnormal   Collection Time: 05/26/18 10:08 PM  Result Value Ref Range   Glucose-Capillary 132 (H) 70 - 99 mg/dL  Basic metabolic panel     Status: Abnormal   Collection Time: 05/27/18  4:20 AM  Result Value Ref Range   Sodium 133 (L) 135 - 145 mmol/L   Potassium 5.2 (H) 3.5 - 5.1 mmol/L   Chloride 102 98 - 111 mmol/L    Comment: Please note change in reference range.   CO2 23 22 - 32 mmol/L   Glucose, Bld 126 (H) 70 - 99 mg/dL    Comment: Please note change in reference range.   BUN 48 (H) 8 - 23 mg/dL    Comment: Please note change in reference range.   Creatinine, Ser 2.18 (H) 0.44 - 1.00 mg/dL   Calcium 9.3 8.9 - 10.3 mg/dL   GFR calc non Af Amer 21 (L) >60 mL/min   GFR calc Af  Amer 24 (L) >60 mL/min    Comment: (NOTE) The eGFR has been calculated using the CKD EPI equation. This calculation has not been validated in all clinical situations. eGFR's persistently <60 mL/min signify possible Chronic Kidney Disease.    Anion gap 8 5 - 15    Comment: Performed at [facility] [address] [place], [place] [address]  Glucose, capillary     Status: Abnormal   Collection Time: 05/27/18  6:49 AM  Result Value Ref Range   Glucose-Capillary 118  (H) 70 - 99 mg/dL  Glucose, capillary     Status: Abnormal   Collection Time: 05/27/18 11:51 AM  Result Value Ref Range   Glucose-Capillary 141 (H) 70 - 99 mg/dL  Glucose, capillary     Status: Abnormal   Collection Time: 05/27/18  4:49 PM  Result Value Ref Range   Glucose-Capillary 162 (H) 70 - 99 mg/dL  Glucose, capillary     Status: Abnormal   Collection Time: 05/27/18 10:12 PM  Result Value Ref Range   Glucose-Capillary 161 (H) 70 - 99 mg/dL  Basic metabolic panel     Status: Abnormal   Collection Time: 05/28/18  4:41 AM  Result Value Ref Range   Sodium 133 (L) 135 - 145 mmol/L   Potassium 4.8 3.5 - 5.1 mmol/L   Chloride 104 98 - 111 mmol/L    Comment: Please note change in reference range.   CO2 21 (L) 22 - 32 mmol/L   Glucose, Bld 121 (H) 70 - 99 mg/dL    Comment: Please note change in reference range.   BUN 48 (H) 8 - 23 mg/dL    Comment: Please note change in reference range.   Creatinine, Ser 2.02 (H) 0.44 - 1.00 mg/dL   Calcium 9.0 8.9 - 10.3 mg/dL   GFR calc non Af Amer 23 (L) >60 mL/min   GFR calc Af Amer 27 (L) >60 mL/min    Comment: (NOTE) The eGFR has been calculated using the CKD EPI equation. This calculation has not been validated in all clinical situations. eGFR's persistently <60 mL/min signify possible Chronic Kidney Disease.    Anion gap 8 5 - 15    Comment: Performed at [facility] [address] [place], [place] [address]  CK     Status: None   Collection Time: 05/28/18  4:41 AM  Result Value Ref Range   Total CK 132 38 - 234 U/L    Comment: Performed at [facility], [place] [address] [place], [place] [address]  CBC     Status: Abnormal   Collection Time: 05/28/18  4:41 AM  Result Value Ref Range   WBC 9.2 4.0 - 10.5 K/uL   RBC 2.77 (L) 3.87 - 5.11 MIL/uL   Hemoglobin 8.1 (L) 12.0 - 15.0 g/dL   HCT 25.9 (L) 36.0 - 46.0 %   MCV 93.5 78.0 - 100.0 fL   MCH 29.2 26.0 - 34.0 pg   MCHC 31.3 30.0 - 36.0 g/dL   RDW 15.9 (H) 11.5 - 15.5  %   Platelets 180 150 - 400 K/uL    Comment: Performed at [facility], [place] [address], [place], [place] [address]  Glucose, capillary     Status: Abnormal   Collection Time: 05/28/18  7:03 AM  Result Value Ref Range   Glucose-Capillary 100 (H) 70 - 99 mg/dL  Glucose, capillary     Status: Abnormal   Collection Time: 05/28/18  11:45 AM  Result Value Ref Range   Glucose-Capillary 124 (H) 70 - 99 mg/dL  Glucose, capillary     Status: Abnormal   Collection Time: 05/28/18  5:04 PM  Result Value Ref Range   Glucose-Capillary 136 (H) 70 - 99 mg/dL   Comment 1 Notify RN   Glucose, capillary     Status: Abnormal   Collection Time: 05/28/18 10:16 PM  Result Value Ref Range   Glucose-Capillary 140 (H) 70 - 99 mg/dL   Comment 1 Notify RN      Constitutional: No distress . Vital signs reviewed. HENT: Normocephalic.  Atraumatic. Eyes: EOMI. No discharge. Cardiovascular: RRR. No JVD. Respiratory: CTA Bilaterally.  Normal effort. GI: BS +. Non-distended. Musc: generalized edema Neuro: somnolent Motor: limited by participation today Bilateral upper extremities: 4+/5 proximal distal Bilateral lower extremities: Hip flexion 1+/5, knee extension 3/5, ankle dorsiflexion 5/5 (left stronger than right, unchanged) Skin: Warm and dry. Intact. Psych: Normal behavior and normal affect.  Assessment/Plan: 1. Functional deficits secondary to L4-5 Lumbar discitis which require 3+ hours per day of interdisciplinary therapy in a comprehensive inpatient rehab setting. Physiatrist is providing close team supervision and 24 hour management of active medical problems listed below. Physiatrist and rehab team continue to assess barriers to discharge/monitor patient progress toward functional and medical goals. FIM: Function - Bathing Bathing activity did not occur: Refused Position: Bed Body parts bathed by patient: Right arm, Left arm, Chest, Abdomen, Buttocks, Left upper leg, Left lower leg, Right  lower leg, Back Body parts bathed by helper: Front perineal area, Right upper leg Assist Level: 2 helpers  Function- Upper Body Dressing/Undressing Upper body dressing/undressing activity did not occur: Refused What is the patient wearing?: Pull over shirt/dress Pull over shirt/dress - Perfomed by helper: Thread/unthread right sleeve, Thread/unthread left sleeve, Put head through opening, Pull shirt over trunk Orthosis activity level: Performed by helper Assist Level: Touching or steadying assistance(Pt > 75%) Function - Lower Body Dressing/Undressing Lower body dressing/undressing activity did not occur: Refused What is the patient wearing?: Pants, Non-skid slipper socks Position: Bed Pants- Performed by helper: Thread/unthread right pants leg, Thread/unthread left pants leg, Pull pants up/down Non-skid slipper socks- Performed by helper: Don/doff right sock, Don/doff left sock Assist for lower body dressing: 2 Helpers  Function - Toileting Toileting activity did not occur: Safety/medical concerns Toileting steps completed by helper: Adjust clothing prior to toileting, Performs perineal hygiene, Adjust clothing after toileting Assist level: Two helpers  Function - Air cabin crew transfer activity did not occur: Risk analyst transfer assistive device: Elevated toilet seat/BSC over toilet(STEDY +2) Assist level to toilet: Maximal assist (Pt 25 - 49%/lift and lower) Assist level from toilet: Maximal assist (Pt 25 - 49%/lift and lower)  Function - Chair/bed transfer Chair/bed transfer activity did not occur: Refused Chair/bed transfer method: Lateral scoot, Other Chair/bed transfer assist level: 2 helpers  Function - Locomotion: Wheelchair Type: Educational psychologist activity did not occur: Refused Max wheelchair distance: 125 Assist Level: Touching or steadying assistance (Pt > 75%) Wheel 50 feet with 2 turns activity did not occur: Refused Assist Level: Touching or  steadying assistance (Pt > 75%) Wheel 150 feet activity did not occur: Refused Function - Locomotion: Ambulation Ambulation activity did not occur: Safety/medical concerns Walk 10 feet activity did not occur: Safety/medical concerns Walk 50 feet with 2 turns activity did not occur: Safety/medical concerns Walk 150 feet activity did not occur: Safety/medical concerns Walk 10 feet on uneven surfaces activity did not occur: Safety/medical concerns  Function - Comprehension Comprehension: Auditory Comprehension assist level: Understands basic 25 - 49% of the time/ requires cueing 50 - 75% of the time  Function - Expression Expression: Verbal Expression assist level: Expresses basic 50 - 74% of the time/requires cueing 25 - 49% of the time. Needs to repeat parts of sentences.  Function - Social Interaction Social Interaction assist level: Interacts appropriately with others - No medications needed.  Function - Problem Solving Problem solving assist level: Solves basic 50 - 74% of the time/requires cueing 25 - 49% of the time  Function - Memory Memory assist level: Recognizes or recalls 50 - 74% of the time/requires cueing 25 - 49% of the time Patient normally able to recall (first 3 days only): Current season, Staff names and faces, That he or she is in a hospital  Medical Problem List and Plan:  1. Functional deficits secondary to L4-5 osteomyelitis/discitis, lumbar spondylosis   Continue CIR 2. DVT Prophylaxis/Anticoagulation: Mechanical: Sequential compression devices, below knee Bilateral lower extremities   Doppler negative for DVT in LE  LUE doppler ordered to evaluate for DVT, negative 3. Pain Management: Scheduled oxycodone prior to therapy to help with activity tolerance, decreased to 5 mg on 6/27.   Added ultram prn for moderate pain. Monitor and titrate medications to help with neuropathic symptoms. Decadron d/ced.   C/o RLE radicular pain, partially improved on gabapentin to  '600mg'$  BID, decreased to 300 mg twice a day, DC'd on 6/27 due to lethargy and tremors   Low dose dexamethasone DC'd on 6/26  Baclofen changed to 5 mg daily at bedtime, patient unable to tolerate higher doses, DC'd on 6/27 due to lethargy 4. Mood: LCSW to follow for evaluation and support.  5. Neuropsych: This patient is capable of making decisions on her own behalf.  6. Skin/Wound Care: routine pressure relief measures  7. Fluids/Electrolytes/Nutrition: Monitor I/O.   8. Lumbar diskitis: Afebrile on scheduled tylenol. On Daptomycin and ceftriaxone. Steroids may make it more difficult to fight infx, pt very uncomfortable and restarted low dose for limited time, DC'd on 6/26 9. Acute on chronic anemia: Treated with iron and procrit on outpatient basis. Baseline Hgb- 9-10 range? Will continue to monitor serially   Hemoglobin 8.1 on 6/27  Hemoccult pending  Labs ordered for today  Continue to monitor 10. Symptomatic Bradycardia s/p PPM: Monitor HR bid.   Controlled at present 11. GIB with thrombocytopenia: Continue to monitor H/H serially for stability.   Platelets 180 on 6/27  Cont to monitor 12. New diagnosis T2DM: Hgb A1c- 6.3. Poorly controlled due to recent rounds of steroids--Lantus was titrated upwards for better control. Was not on any medications at home. Monitor BS ac/hs--watch for hypoglycemic episodes as steroids tapered off.  CBG (last 3)  Recent Labs    05/28/18 1145 05/28/18 1704 05/28/18 2216  GLUCAP 124* 136* 140*   Relatively controlled on 6/28 13. Neuropathy BLE: Continue Gabapentin bid--monitor for SE.  14. Hyponatremia: Question due to intake. Added protein supplements with meals. monitor for now.   Sodium 133 on 6/27  Continue to monitor 15. Stage IV CKD: Baseline SCr 3.5-4.0. Monitor for SE/asterixis with narcotics.   Creatinine 2.02 on 6/27  Encourage fluids  Will speak to nephro Re: [place] ordered for today  Continue to monitor 16. Fluid overload: Monitor  daily weights and for signs of overload.   Lasix increased to 80 mg daily on 6/27 (PTCA 80 twice a day) Filed Weights   05/26/18 0600 05/27/18  0500 05/29/18 0500  Weight: 101.1 kg (222 lb 14.2 oz) 105.9 kg (233 lb 7.5 oz) 102.3 kg (225 lb 8.5 oz)   ?Reliability 17. Morbid obesity  Encourage weight loss 18. Essential hypertension  Continue Norvasc 10 mg daily  Hydralazine 50 mg 3 times a day, increased to 75 on 6/24  Improving on 6/28 19. Hypoalbuminemia  Supplement initiated 20. Hyperkalemia  Potassium 4.8 on 6/27  Labs ordered for today  Continue to monitor 21. Lethargy  Likely medication induced  CXR reviewed, ECG reviewed, head CT reviewed, EEG performed - ? Borderline QTC prolongation  Discussed with pharmacy  TSH, free T3/T4 ordered  Fever noted overnight on 6/28  UA/U culture ordered  Labs ordered for today 22. Hypothyroidism  Continue meds   See #21  LOS (Days) 7 A FACE TO FACE EVALUATION WAS PERFORMED  Ankit [name] 05/29/2018, 8:49 AM

## 2018-05-29 NOTE — Progress Notes (Signed)
Occupational Therapy Session Note  Patient Details  Name: Erika Fry MRN: [id number] Date of Birth: 05/20/1946  Today's Date: 05/29/2018 OT Individual Time:  -       Short Term Goals: Week 1:  OT Short Term Goal 1 (Week 1): Pt will report no more than 5/10 pain in order to participate more functionally in ADLs OT Short Term Goal 2 (Week 1): Pt will sit EOB for >10 min with no more than mod A to participate in morning ADL routine OT Short Term Goal 3 (Week 1): Pt will don shirt with min A  OT Short Term Goal 4 (Week 1): Pt will adhere to back precautions during bed mobility with no more than 2 vc   Skilled Therapeutic Interventions/Progress Updates:     Attempted to see patient for scheduled therapy session. Pt is supine in bed asleep and unable to be aroused even with sternal rubbing. Per previous PT, MD, and CNA reports pt has been unable to arouse and participate since yesterday morning. Her vitals are WNL. No skilled therapy minutes accumulated at this time. Will attempt to see patient at later date.    Therapy Documentation Precautions:  Precautions Precautions: Fall Precaution Comments: Pacemaker placement 4 weeks ago Required Braces or Orthoses: Spinal Brace(LSO for comfort) Spinal Brace: Lumbar corset Restrictions Weight Bearing Restrictions: No General: General OT Amount of Missed Time: 60 Minutes Vital Signs: Therapy Vitals Temp: 98.3 F (36.8 C) Temp Source: Oral Pulse Rate: 97 Resp: 18 BP: (!) 154/85 Patient Position (if appropriate): Lying Oxygen Therapy SpO2: 97 % O2 Device: Room Air ADL: ADL ADL Comments: See functional navigator  See Function Navigator for Current Functional Status.   Therapy/Group: Individual Therapy  Delon [place] 05/29/2018, 9:24 AM

## 2018-05-30 ENCOUNTER — Inpatient Hospital Stay (HOSPITAL_COMMUNITY): Payer: Medicare Other | Admitting: Physical Therapy

## 2018-05-30 DIAGNOSIS — R410 Disorientation, unspecified: Secondary | ICD-10-CM

## 2018-05-30 LAB — GLUCOSE, CAPILLARY
Glucose-Capillary: 108 mg/dL — ABNORMAL HIGH (ref 70–99)
Glucose-Capillary: 94 mg/dL (ref 70–99)
Glucose-Capillary: 141 mg/dL — ABNORMAL HIGH (ref 70–99)
Glucose-Capillary: 146 mg/dL — ABNORMAL HIGH (ref 70–99)

## 2018-05-30 LAB — BASIC METABOLIC PANEL
Anion gap: 9 (ref 5–15)
BUN: 39 mg/dL — ABNORMAL HIGH (ref 8–23)
CO2: 25 mmol/L (ref 22–32)
CREATININE: 1.76 mg/dL — AB (ref 0.44–1.00)
Calcium: 9.2 mg/dL (ref 8.9–10.3)
Chloride: 106 mmol/L (ref 98–111)
GFR calc Af Amer: 32 mL/min — ABNORMAL LOW (ref >60)
GFR calc non Af Amer: 27 mL/min — ABNORMAL LOW (ref >60)
Glucose, Bld: 128 mg/dL — ABNORMAL HIGH (ref 70–99)
POTASSIUM: 3.6 mmol/L (ref 3.5–5.1)
SODIUM: 140 mmol/L (ref 135–145)

## 2018-05-30 LAB — URINALYSIS, COMPLETE (UACMP) WITH MICROSCOPIC
BILIRUBIN URINE: NEGATIVE
Bacteria, UA: NONE SEEN
GLUCOSE, UA: NEGATIVE mg/dL
Ketones, ur: NEGATIVE mg/dL
Leukocytes, UA: NEGATIVE
NITRITE: NEGATIVE
PH: 5 (ref 5.0–8.0)
Protein, ur: 30 mg/dL — AB
SPECIFIC GRAVITY, URINE: 1.008 (ref 1.005–1.030)

## 2018-05-30 LAB — OCCULT BLOOD X 1 CARD TO LAB, STOOL: Fecal Occult Bld: POSITIVE — AB

## 2018-05-30 LAB — T3, FREE: T3 FREE: 0.5 pg/mL — AB (ref 2.0–4.4)

## 2018-05-30 NOTE — Progress Notes (Signed)
Physical Therapy Weekly Progress Note  Patient Details  Name: Erika Fry MRN: [id number] Date of Birth: [birth year]  Beginning of progress report period: May 23, 2018 End of progress report period: May 30, 2018  Today's Date: 05/30/2018 PT Individual Time: 0762-2633 PT Individual Time Calculation (min): 30 min   Patient has met 0 of 4 short term goals.  Pt has been non-responsive and unable to be aroused to participate in therapy sessions over the past several days due to a change in medication leading the the patient becoming significantly lethargic. Medical team, RN, and therapy team aware of pt's current status and inability to fully participate in therapy sessions. Pt has been decreased to QD and to be seen 30 min/day until she is able to be more awake/alert and fully engage in therapy sessions. Pt has recently become more alert and is slowing progressing to tolerating participation in therapy sessions. Week 1 goals continued into week 2 and will reassess pending pt progress and alertness level.  Patient continues to demonstrate the following deficits muscle weakness, decreased cardiorespiratoy endurance and decreased sitting balance and decreased standing balance and therefore will continue to benefit from skilled PT intervention to increase functional independence with mobility.  Patient progressing toward long term goals..  Continue plan of care.  PT Short Term Goals Week 1:  PT Short Term Goal 1 (Week 1): Pt will tolerate 1 hour of sitting up in chair w/ minimal increase in pain or fatigue PT Short Term Goal 1 - Progress (Week 1): Not met PT Short Term Goal 2 (Week 1): Pt will transfer bed<>chair w/ max assist x1 PT Short Term Goal 2 - Progress (Week 1): Not met PT Short Term Goal 3 (Week 1): Pt will initiate gait training PT Short Term Goal 3 - Progress (Week 1): Not met PT Short Term Goal 4 (Week 1): Pt will perform sit<>stand w/ LRAD, max assist x1 PT Short Term Goal 4 -  Progress (Week 1): Not met Week 2:  PT Short Term Goal 1 (Week 2): Pt will tolerate 1 hour of sitting up in chair w/ minimal increase in pain or fatigue PT Short Term Goal 2 (Week 2): Pt will transfer bed<>chair w/ max assist x1 PT Short Term Goal 3 (Week 2): Pt will perform sit<>stand w/ LRAD, max assist x1  Skilled Therapeutic Interventions/Progress Updates:  Pt received supine in bed, reports she is currently using a bedpan. Pt is more alert this therapy session than previous sessions and is able to keep her eyes open and engage in conversation. Pt is very emotional with regards to feeling "out of it" the past few days and perseverates on her emotional distress. RN aware of pt's emotional distress and has put in referral for chaplain services. Pt agreeable to therapist assist to remove bedpan and assist with cleanup. Rolling L/R with hand-over-hand assist to find bedrails and assist x 2 to roll due to pt weakness and pain in R hip with movement. Pt does not rate pain. Pt is dependent for bedpan removal, pericare, and brief change. Pt left semi-reclined in bed with needs in reach at end of therapy session.  Therapy Documentation Precautions:  Precautions Precautions: Fall Precaution Comments: Pacemaker placement 4 weeks ago Required Braces or Orthoses: Spinal Brace(LSO for comfort) Spinal Brace: Lumbar corset Restrictions Weight Bearing Restrictions: No  See Function Navigator for Current Functional Status.  Therapy/Group: Individual Therapy  Excell Seltzer, PT, DPT  05/30/2018, 12:18 PM

## 2018-05-30 NOTE — Progress Notes (Signed)
Patient tearful and somewhat confused, perseverating on pain at beginning of shift. Given emotional support and strategies to endure/distract. Some slurred speech was noted and patient had difficulty remembering names of staff/family until later in the day when speech and memory became clearer. Family came to visit patient. No more complaints of pain, tolerated caths and bedpan. Patient expressing optimism at end of shift.

## 2018-05-30 NOTE — Plan of Care (Signed)
Perseverating on pain, emotional support given and alternative methods of managing discomfort. Patient responding positively.

## 2018-05-30 NOTE — Progress Notes (Signed)
Subjective/Complaints: Patient seen lying in bed this morning.  She slept well overnight.  Per nursing patient with significant improvement.  Patient still perseverative.  ROS: Unable to accurately assess due to cognition, but appears to denies CP, S OB, nausea, vomiting, diarrhea.    Objective: Vital Signs: Blood pressure (!) 158/82, pulse 91, temperature 99.5 F (37.5 C), temperature source Oral, resp. rate 18, height 5' 5"  (1.651 m), weight 99.6 kg (219 lb 9.3 oz), SpO2 99 %. Ct Head Wo Contrast  Result Date: 05/28/2018 CLINICAL DATA:  Initial evaluation for acute altered mental status. EXAM: CT HEAD WITHOUT CONTRAST TECHNIQUE: Contiguous axial images were obtained from the base of the skull through the vertex without intravenous contrast. COMPARISON:  None available. FINDINGS: Brain: Generalized age-related cerebral atrophy with mild chronic small vessel ischemic disease. No acute intracranial hemorrhage. No acute large vessel territory infarct. No mass lesion, midline shift or mass effect. No hydrocephalus. No extra-axial fluid collection. Vascular: No hyperdense vessel. Calcified atherosclerosis present at the skull base. Skull: Scalp soft tissues within normal limits.  Calvarium intact. Sinuses/Orbits: Globes and orbital soft tissues within normal limits. Small retention cysts noted within the right sphenoid sinus. Paranasal sinuses are otherwise clear. Small right mastoid effusion noted. Other: None. IMPRESSION: 1. No acute intracranial abnormality. 2. Mild age-related cerebral atrophy with chronic small vessel ischemic disease. Electronically Signed   By: Jeannine Boga M.D.   On: 05/28/2018 15:48   Results for orders placed or performed during the hospital encounter of 05/22/18 (from the past 72 hour(s))  Glucose, capillary     Status: Abnormal   Collection Time: 05/27/18  4:49 PM  Result Value Ref Range   Glucose-Capillary 162 (H) 70 - 99 mg/dL  Glucose, capillary     Status:  Abnormal   Collection Time: 05/27/18 10:12 PM  Result Value Ref Range   Glucose-Capillary 161 (H) 70 - 99 mg/dL  Basic metabolic panel     Status: Abnormal   Collection Time: 05/28/18  4:41 AM  Result Value Ref Range   Sodium 133 (L) 135 - 145 mmol/L   Potassium 4.8 3.5 - 5.1 mmol/L   Chloride 104 98 - 111 mmol/L    Comment: Please note change in reference range.   CO2 21 (L) 22 - 32 mmol/L   Glucose, Bld 121 (H) 70 - 99 mg/dL    Comment: Please note change in reference range.   BUN 48 (H) 8 - 23 mg/dL    Comment: Please note change in reference range.   Creatinine, Ser 2.02 (H) 0.44 - 1.00 mg/dL   Calcium 9.0 8.9 - 10.3 mg/dL   GFR calc non Af Amer 23 (L) >60 mL/min   GFR calc Af Amer 27 (L) >60 mL/min    Comment: (NOTE) The eGFR has been calculated using the CKD EPI equation. This calculation has not been validated in all clinical situations. eGFR's persistently <60 mL/min signify possible Chronic Kidney Disease.    Anion gap 8 5 - 15    Comment: Performed at [facility] [address] [place], [place] [address]  CK     Status: None   Collection Time: 05/28/18  4:41 AM  Result Value Ref Range   Total CK 132 38 - 234 U/L    Comment: Performed at [facility], [place] [address] [place], [place] [address]  CBC     Status: Abnormal   Collection Time: 05/28/18  4:41 AM  Result Value Ref Range  WBC 9.2 4.0 - 10.5 K/uL   RBC 2.77 (L) 3.87 - 5.11 MIL/uL   Hemoglobin 8.1 (L) 12.0 - 15.0 g/dL   HCT 25.9 (L) 36.0 - 46.0 %   MCV 93.5 78.0 - 100.0 fL   MCH 29.2 26.0 - 34.0 pg   MCHC 31.3 30.0 - 36.0 g/dL   RDW 15.9 (H) 11.5 - 15.5 %   Platelets 180 150 - 400 K/uL    Comment: Performed at [facility] [address] [place], [place] [address]  Glucose, capillary     Status: Abnormal   Collection Time: 05/28/18  7:03 AM  Result Value Ref Range   Glucose-Capillary 100 (H) 70 - 99 mg/dL  Glucose, capillary     Status: Abnormal   Collection Time: 05/28/18  11:45 AM  Result Value Ref Range   Glucose-Capillary 124 (H) 70 - 99 mg/dL  Glucose, capillary     Status: Abnormal   Collection Time: 05/28/18  5:04 PM  Result Value Ref Range   Glucose-Capillary 136 (H) 70 - 99 mg/dL   Comment 1 Notify RN   Glucose, capillary     Status: Abnormal   Collection Time: 05/28/18 10:16 PM  Result Value Ref Range   Glucose-Capillary 140 (H) 70 - 99 mg/dL   Comment 1 Notify RN   T4, free     Status: None   Collection Time: 05/29/18  9:46 AM  Result Value Ref Range   Free T4 0.90 0.82 - 1.77 ng/dL    Comment: (NOTE) Biotin ingestion may interfere with free T4 tests. If the results are inconsistent with the TSH level, previous test results, or the clinical presentation, then consider biotin interference. If needed, order repeat testing after stopping biotin. Performed at [facility], [place] [address], [place], [place] [address]   T3, free     Status: Abnormal   Collection Time: 05/29/18  9:46 AM  Result Value Ref Range   T3, Free 0.5 (L) 2.0 - 4.4 pg/mL    Comment: (NOTE) Performed At: [facility] [place], [place] [id number] Rush Farmer MD [id number] Performed at [facility], [place] [address], [place], [place] [address]   CBC     Status: Abnormal   Collection Time: 05/29/18  9:46 AM  Result Value Ref Range   WBC 12.0 (H) 4.0 - 10.5 K/uL   RBC 3.13 (L) 3.87 - 5.11 MIL/uL   Hemoglobin 9.2 (L) 12.0 - 15.0 g/dL   HCT 29.0 (L) 36.0 - 46.0 %   MCV 92.7 78.0 - 100.0 fL   MCH 29.4 26.0 - 34.0 pg   MCHC 31.7 30.0 - 36.0 g/dL   RDW 16.1 (H) 11.5 - 15.5 %   Platelets 156 150 - 400 K/uL    Comment: Performed at [facility], [place] [address] [place], [place] [address]  Comprehensive metabolic panel     Status: Abnormal   Collection Time: 05/29/18  9:46 AM  Result Value Ref Range   Sodium 140 135 - 145 mmol/L   Potassium 4.0 3.5 - 5.1 mmol/L   Chloride 105 98 - 111 mmol/L    Comment: Please note change in  reference range.   CO2 24 22 - 32 mmol/L   Glucose, Bld 128 (H) 70 - 99 mg/dL    Comment: Please note change in reference range.   BUN 42 (H) 8 - 23 mg/dL    Comment: Please note change in reference range.  Creatinine, Ser 1.86 (H) 0.44 - 1.00 mg/dL   Calcium 9.6 8.9 - 10.3 mg/dL   Total Protein 5.5 (L) 6.5 - 8.1 g/dL   Albumin 2.3 (L) 3.5 - 5.0 g/dL   AST 77 (H) 15 - 41 U/L   ALT 35 0 - 44 U/L    Comment: Please note change in reference range.   Alkaline Phosphatase 102 38 - 126 U/L   Total Bilirubin 0.6 0.3 - 1.2 mg/dL   GFR calc non Af Amer 26 (L) >60 mL/min   GFR calc Af Amer 30 (L) >60 mL/min    Comment: (NOTE) The eGFR has been calculated using the CKD EPI equation. This calculation has not been validated in all clinical situations. eGFR's persistently <60 mL/min signify possible Chronic Kidney Disease.    Anion gap 11 5 - 15    Comment: Performed at [facility] [address], [place], [place] [address]  TSH     Status: None   Collection Time: 05/29/18  9:46 AM  Result Value Ref Range   TSH 0.706 0.350 - 4.500 uIU/mL    Comment: Performed by a 3rd Generation assay with a functional sensitivity of <=0.01 uIU/mL. Performed at [facility], [place] [address], [place], [place] [address]   Glucose, capillary     Status: Abnormal   Collection Time: 05/29/18 11:56 AM  Result Value Ref Range   Glucose-Capillary 118 (H) 70 - 99 mg/dL  Glucose, capillary     Status: Abnormal   Collection Time: 05/29/18  4:38 PM  Result Value Ref Range   Glucose-Capillary 121 (H) 70 - 99 mg/dL  Glucose, capillary     Status: Abnormal   Collection Time: 05/29/18  9:09 PM  Result Value Ref Range   Glucose-Capillary 117 (H) 70 - 99 mg/dL  Urinalysis, Complete w Microscopic     Status: Abnormal   Collection Time: 05/30/18  1:00 AM  Result Value Ref Range   Color, Urine YELLOW YELLOW   APPearance CLEAR CLEAR   Specific Gravity, Urine 1.008 1.005 - 1.030   pH 5.0 5.0 - 8.0    Glucose, UA NEGATIVE NEGATIVE mg/dL   Hgb urine dipstick MODERATE (A) NEGATIVE   Bilirubin Urine NEGATIVE NEGATIVE   Ketones, ur NEGATIVE NEGATIVE mg/dL   Protein, ur 30 (A) NEGATIVE mg/dL   Nitrite NEGATIVE NEGATIVE   Leukocytes, UA NEGATIVE NEGATIVE   RBC / HPF 0-5 0 - 5 RBC/hpf   WBC, UA 0-5 0 - 5 WBC/hpf   Bacteria, UA NONE SEEN NONE SEEN   Squamous Epithelial / LPF 0-5 0 - 5    Comment: Performed at [facility], [place] [address], [place], [place] [address]  Basic metabolic panel     Status: Abnormal   Collection Time: 05/30/18  3:29 AM  Result Value Ref Range   Sodium 140 135 - 145 mmol/L   Potassium 3.6 3.5 - 5.1 mmol/L   Chloride 106 98 - 111 mmol/L    Comment: Please note change in reference range.   CO2 25 22 - 32 mmol/L   Glucose, Bld 128 (H) 70 - 99 mg/dL    Comment: Please note change in reference range.   BUN 39 (H) 8 - 23 mg/dL    Comment: Please note change in reference range.   Creatinine, Ser 1.76 (H) 0.44 - 1.00 mg/dL   Calcium 9.2 8.9 - 10.3 mg/dL   GFR calc non Af Amer 27 (L) >60 mL/min  GFR calc Af Amer 32 (L) >60 mL/min    Comment: (NOTE) The eGFR has been calculated using the CKD EPI equation. This calculation has not been validated in all clinical situations. eGFR's persistently <60 mL/min signify possible Chronic Kidney Disease.    Anion gap 9 5 - 15    Comment: Performed at [facility] [address], [place], [place] [address]  Glucose, capillary     Status: Abnormal   Collection Time: 05/30/18  6:39 AM  Result Value Ref Range   Glucose-Capillary 108 (H) 70 - 99 mg/dL  Occult blood card to lab, stool RN will collect     Status: Abnormal   Collection Time: 05/30/18 10:04 AM  Result Value Ref Range   Fecal Occult Bld POSITIVE (A) NEGATIVE    Comment: Performed at [facility], [place] [address], [place], [place] [address]  Glucose, capillary     Status: None   Collection Time: 05/30/18 12:09 PM  Result Value Ref Range    Glucose-Capillary 94 70 - 99 mg/dL     Constitutional: No distress . Vital signs reviewed. HENT: Normocephalic.  Atraumatic. Eyes: EOMI. No discharge. Cardiovascular: RRR. No JVD. Respiratory: CTA Bilaterally.  Normal effort. GI: BS +. Non-distended. Musc: generalized edema, stable Neuro: Alert Motor: limited by participation again today, previously Bilateral upper extremities: 4+/5 proximal distal Bilateral lower extremities: Hip flexion 1+/5, knee extension 3/5, ankle dorsiflexion 5/5 (left stronger than right, unchanged) Skin: Warm and dry. Intact. Psych: Perseverative  Assessment/Plan: 1. Functional deficits secondary to L4-5 Lumbar discitis which require 3+ hours per day of interdisciplinary therapy in a comprehensive inpatient rehab setting. Physiatrist is providing close team supervision and 24 hour management of active medical problems listed below. Physiatrist and rehab team continue to assess barriers to discharge/monitor patient progress toward functional and medical goals. FIM: Function - Bathing Bathing activity did not occur: Refused Position: Bed Body parts bathed by patient: Right arm, Left arm, Chest, Abdomen, Buttocks, Left upper leg, Left lower leg, Right lower leg, Back Body parts bathed by helper: Front perineal area, Right upper leg Assist Level: 2 helpers  Function- Upper Body Dressing/Undressing Upper body dressing/undressing activity did not occur: Refused What is the patient wearing?: Pull over shirt/dress Pull over shirt/dress - Perfomed by helper: Thread/unthread right sleeve, Thread/unthread left sleeve, Put head through opening, Pull shirt over trunk Orthosis activity level: Performed by helper Assist Level: Touching or steadying assistance(Pt > 75%) Function - Lower Body Dressing/Undressing Lower body dressing/undressing activity did not occur: Refused What is the patient wearing?: Pants, Non-skid slipper socks Position: Bed Pants- Performed by  helper: Thread/unthread right pants leg, Thread/unthread left pants leg, Pull pants up/down Non-skid slipper socks- Performed by helper: Don/doff right sock, Don/doff left sock Assist for lower body dressing: 2 Helpers  Function - Toileting Toileting activity did not occur: Safety/medical concerns Toileting steps completed by helper: Adjust clothing prior to toileting, Performs perineal hygiene, Adjust clothing after toileting Assist level: Two helpers  Function - Air cabin crew transfer activity did not occur: Safety/medical concerns Toilet transfer assistive device: Elevated toilet seat/BSC over toilet(STEDY +2) Assist level to toilet: Maximal assist (Pt 25 - 49%/lift and lower) Assist level from toilet: Maximal assist (Pt 25 - 49%/lift and lower)  Function - Chair/bed transfer Chair/bed transfer activity did not occur: Refused Chair/bed transfer method: Lateral scoot, Other Chair/bed transfer assist level: 2 helpers  Function - Locomotion: Wheelchair Type: Manual Wheelchair activity did not occur: Refused Max wheelchair distance: 125  Assist Level: Touching or steadying assistance (Pt > 75%) Wheel 50 feet with 2 turns activity did not occur: Refused Assist Level: Touching or steadying assistance (Pt > 75%) Wheel 150 feet activity did not occur: Refused Function - Locomotion: Ambulation Ambulation activity did not occur: Safety/medical concerns Walk 10 feet activity did not occur: Safety/medical concerns Walk 50 feet with 2 turns activity did not occur: Safety/medical concerns Walk 150 feet activity did not occur: Safety/medical concerns Walk 10 feet on uneven surfaces activity did not occur: Safety/medical concerns  Function - Comprehension Comprehension: Auditory Comprehension assist level: Understands basic 25 - 49% of the time/ requires cueing 50 - 75% of the time  Function - Expression Expression: Verbal Expression assist level: Expresses basic 50 - 74% of the  time/requires cueing 25 - 49% of the time. Needs to repeat parts of sentences.  Function - Social Interaction Social Interaction assist level: Interacts appropriately 25 - 49% of time - Needs frequent redirection.  Function - Problem Solving Problem solving assist level: Solves basic less than 25% of the time - needs direction nearly all the time or does not effectively solve problems and may need a restraint for safety  Function - Memory Memory assist level: Recognizes or recalls 25 - 49% of the time/requires cueing 50 - 75% of the time Patient normally able to recall (first 3 days only): That he or she is in a hospital  Medical Problem List and Plan:  1. Functional deficits secondary to L4-5 osteomyelitis/discitis, lumbar spondylosis   Continue CIR 2. DVT Prophylaxis/Anticoagulation: Mechanical: Sequential compression devices, below knee Bilateral lower extremities   Doppler negative for DVT in LE  LUE doppler ordered to evaluate for DVT, negative 3. Pain Management: Scheduled oxycodone prior to therapy to help with activity tolerance, decreased to 5 mg on 6/27.   Added ultram prn for moderate pain. Monitor and titrate medications to help with neuropathic symptoms. Decadron d/ced.   RLE radicular pain, partially improved on gabapentin to 611m BID, decreased to 300 mg twice a day, DC'd on 6/27 due to lethargy and tremors   Low dose dexamethasone DC'd on 6/26  Baclofen changed to 5 mg daily at bedtime, patient unable to tolerate higher doses, DC'd on 6/27 due to lethargy 4. Mood: LCSW to follow for evaluation and support.  5. Neuropsych: This patient is capable of making decisions on her own behalf.  6. Skin/Wound Care: routine pressure relief measures  7. Fluids/Electrolytes/Nutrition: Monitor I/O.   8. Lumbar diskitis: Afebrile on scheduled tylenol. On Daptomycin and ceftriaxone. Steroids may make it more difficult to fight infx, pt very uncomfortable and restarted low dose for limited  time, DC'd on 6/26 9. Acute on chronic anemia: Treated with iron and procrit on outpatient basis. Baseline Hgb- 9-10 range? Will continue to monitor serially   Hemoglobin 8.1 on 6/27  Hemoccult pending  Labs ordered for today  Continue to monitor 10. Symptomatic Bradycardia s/p PPM: Monitor HR bid.   Controlled at present 11. GIB with thrombocytopenia: Continue to monitor H/H serially for stability.   Platelets 180 on 6/27  Cont to monitor 12. New diagnosis T2DM: Hgb A1c- 6.3. Poorly controlled due to recent rounds of steroids--Lantus was titrated upwards for better control. Was not on any medications at home. Monitor BS ac/hs--watch for hypoglycemic episodes as steroids tapered off.  CBG (last 3)  Recent Labs    05/29/18 2109 05/30/18 0639 05/30/18 1209  GLUCAP 117* 108* 94   Relatively controlled on 6/29 13. Neuropathy BLE:  Continue Gabapentin bid--monitor for SE.  14. Hyponatremia: Resolved  Added protein supplements with meals. monitor for now.   Sodium 140 on 6/29  Continue to monitor 15. Stage IV CKD: Baseline SCr 3.5-4.0. Monitor for SE/asterixis with narcotics.   Creatinine 1.76 on 6/29  Encourage fluids, continue IVF, will plan to DC tomorrow  Will speak to nephro Re: Recs  Continue to monitor 16. Fluid overload: Monitor daily weights and for signs of overload.   Lasix increased to 80 mg daily on 6/27 (PTCA 80 twice a day) Filed Weights   05/29/18 0500 05/29/18 1520 05/30/18 0500  Weight: 102.3 kg (225 lb 8.5 oz) 101.3 kg (223 lb 5.2 oz) 99.6 kg (219 lb 9.3 oz)   ?Reliability 17. Morbid obesity  Encourage weight loss 18. Essential hypertension  Continue Norvasc 10 mg daily  Hydralazine 50 mg 3 times a day, increased to 75 on 6/24  Labile on 6/29, will consider rhythm medications once cognition improves 19. Hypoalbuminemia  Supplement initiated 20. Hyperkalemia  Potassium 3.6 on 6/29  Continue to monitor 21. AMS  Lethargy improving  Likely medication  induced  CXR reviewed, ECG reviewed, head CT reviewed, EEG performed - ? Borderline QTC prolongation  Discussed with pharmacy  TSH, free T4 within acceptable range  Free T3 low  Continues to have low grade fever  UA unremarkable.  Urine culture pending  WBCs 12.0 on 6/28, labs ordered for tomorrow  Blood cultures ordered 22. Hypothyroidism  Continue meds   See #21  LOS (Days) 8 A FACE TO FACE EVALUATION WAS PERFORMED  Maleeah Crossman [name] 05/30/2018, 3:11 PM

## 2018-05-30 NOTE — Progress Notes (Signed)
Pt is awake, alert, oriented to self. Pt is crying and saying " I want to go home, this is no way to live, please take me home." "Lord take me home." Pt was comforted explained what had happened, however, pt was too emotional to comprehend. Pt had to straight cathed at [facility] for PVR >547. Pt remains awake and emotional. Pt was given oral care. Pt c/o extreme thirst, was sat upright and given 3 ice chips one at a time. Pt tolerated well no coughing or choking. IVF's continue running. Family was educated and questions answered at beginning shift around 17. Pt's VSS. Will continue to monitor

## 2018-05-31 DIAGNOSIS — G479 Sleep disorder, unspecified: Secondary | ICD-10-CM

## 2018-05-31 LAB — GLUCOSE, CAPILLARY
GLUCOSE-CAPILLARY: 149 mg/dL — AB (ref 70–99)
Glucose-Capillary: 114 mg/dL — ABNORMAL HIGH (ref 70–99)
Glucose-Capillary: 166 mg/dL — ABNORMAL HIGH (ref 70–99)
Glucose-Capillary: 153 mg/dL — ABNORMAL HIGH (ref 70–99)

## 2018-05-31 LAB — BASIC METABOLIC PANEL
ANION GAP: 8 (ref 5–15)
BUN: 34 mg/dL — ABNORMAL HIGH (ref 8–23)
CHLORIDE: 101 mmol/L (ref 98–111)
CO2: 26 mmol/L (ref 22–32)
Calcium: 8.7 mg/dL — ABNORMAL LOW (ref 8.9–10.3)
Creatinine, Ser: 1.67 mg/dL — ABNORMAL HIGH (ref 0.44–1.00)
GFR calc Af Amer: 34 mL/min — ABNORMAL LOW (ref >60)
GFR calc non Af Amer: 29 mL/min — ABNORMAL LOW (ref >60)
GLUCOSE: 119 mg/dL — AB (ref 70–99)
POTASSIUM: 3.1 mmol/L — AB (ref 3.5–5.1)
Sodium: 135 mmol/L (ref 135–145)

## 2018-05-31 LAB — CBC WITH DIFFERENTIAL/PLATELET
Abs Immature Granulocytes: 0.1 10*3/uL (ref 0.0–0.1)
BASOS PCT: 0 %
Basophils Absolute: 0 10*3/uL (ref 0.0–0.1)
EOS ABS: 0.1 10*3/uL (ref 0.0–0.7)
EOS PCT: 1 %
HEMATOCRIT: 28.2 % — AB (ref 36.0–46.0)
Hemoglobin: 9.2 g/dL — ABNORMAL LOW (ref 12.0–15.0)
Immature Granulocytes: 1 %
LYMPHS ABS: 0.5 10*3/uL — AB (ref 0.7–4.0)
Lymphocytes Relative: 5 %
MCH: 29.8 pg (ref 26.0–34.0)
MCHC: 32.6 g/dL (ref 30.0–36.0)
MCV: 91.3 fL (ref 78.0–100.0)
MONO ABS: 0.7 10*3/uL (ref 0.1–1.0)
Monocytes Relative: 6 %
NEUTROS PCT: 87 %
Neutro Abs: 9.7 10*3/uL — ABNORMAL HIGH (ref 1.7–7.7)
PLATELETS: 153 10*3/uL (ref 150–400)
RBC: 3.09 MIL/uL — ABNORMAL LOW (ref 3.87–5.11)
RDW: 15.9 % — ABNORMAL HIGH (ref 11.5–15.5)
WBC: 11.1 10*3/uL — ABNORMAL HIGH (ref 4.0–10.5)

## 2018-05-31 LAB — URINE CULTURE: Culture: NO GROWTH

## 2018-05-31 LAB — OCCULT BLOOD X 1 CARD TO LAB, STOOL: Fecal Occult Bld: POSITIVE — AB

## 2018-05-31 MED ORDER — MELATONIN 3 MG PO TABS
1.5000 mg | ORAL_TABLET | Freq: Every day | ORAL | Status: DC
  Administered 2018-05-31 – 2018-06-21 (×22): 1.5 mg via ORAL
  Filled 2018-05-31 (×23): qty 0.5

## 2018-05-31 MED ORDER — NON FORMULARY: 1.5000 mg | Freq: Every day | Status: DC

## 2018-05-31 MED ORDER — POTASSIUM CHLORIDE CRYS ER 20 MEQ PO TBCR
30.0000 meq | EXTENDED_RELEASE_TABLET | Freq: Every day | ORAL | Status: DC
  Administered 2018-05-31 – 2018-06-03 (×4): 30 meq via ORAL
  Filled 2018-05-31 (×4): qty 1

## 2018-05-31 NOTE — Progress Notes (Signed)
[facility] for Daptomycin Indication: Lumbar discitis/osteo    Allergies  Allergen Reactions  . Codeine Other (See Comments)    Increases Pain and couldn't sleep    Patient Measurements: Height: 5\' 5"  (165.1 cm) Weight: 208 lb 1.8 oz (94.4 kg) IBW/kg (Calculated) : 57 Adjusted Body Weight:   Vital Signs: Temp: 100.1 F (37.8 C) (06/30 0551) Temp Source: Oral (06/30 0551) BP: 153/81 (06/30 0551) Pulse Rate: 97 (06/30 0551) Intake/Output from previous day: 06/29 0701 - 06/30 0700 In: 2119.6 [P.O.:960; I.V.:1039.6; IV Piggyback:120] Out: 1950 [Urine:1950] Intake/Output from this shift: No intake/output data recorded.  Labs: Recent Labs    05/29/18 0946 05/30/18 0329 05/31/18 0341  WBC 12.0*  --  11.1*  HGB 9.2*  --  9.2*  PLT 156  --  153  CREATININE 1.86* 1.76* 1.67*   Estimated Creatinine Clearance: 33.6 mL/min (A) (by C-G formula based on SCr of 1.67 mg/dL (H)). No results for input(s): VANCOTROUGH, VANCOPEAK, VANCORANDOM, GENTTROUGH, GENTPEAK, GENTRANDOM, TOBRATROUGH, TOBRAPEAK, TOBRARND, AMIKACINPEAK, AMIKACINTROU, AMIKACIN in the last 72 hours.    Assessment: ID: Osteomyelitis / lumbar discitis. Tm 100.1. WBC WNL  6/14: image-guided lumbar 4-lumbar 5 disc aspiration. PICC 6/17 - For 6 weeks of therapy.  Scr 1.67   Zosyn 6/12>>6/16 CTX 6/16 >> (7/24) Vanco 6/12 >> 6/18 Dapto 6/18 >(7/24)  - 5/29: CK 81 -6/21: CK 139 6/27: CK 132  6/14 Fungus Cx > Pending 6/14 Wound/Lumbar Aspirate Cx > ngtd, prelim 6/15 Blood Cx > ngtd 6/27 Blood cx>>pending  6/12 MRI: Findings concerning for acute osteomyelitis discitis involving the right aspect of the L4-5 interspace as above.   Plan:  -Continue Daptomycin 8mg /kg q 24h  -CTX 2g q24h per MD -CK weekly on [place], PharmD, BCPS, AAHIVP, CPP Infectious Disease Pharmacist Pager: [phone] 05/31/2018 8:20 AM

## 2018-05-31 NOTE — Progress Notes (Signed)
Subjective/Complaints: Patient seen lying in bed this morning.  She states she slept well overnight.  She is extremely apologetic regarding her behavior while on medication despite reassurance that she should feel no need to apologize.  She request medications for sleep.  ROS: Denies CP, S OB, nausea, vomiting diarrhea.  Objective: Vital Signs: Blood pressure (!) 153/81, pulse 97, temperature 100.1 F (37.8 C), temperature source Oral, resp. rate 16, height '5\' 5"'$  (1.651 m), weight 94.4 kg (208 lb 1.8 oz), SpO2 95 %. No results found. Results for orders placed or performed during the hospital encounter of 05/22/18 (from the past 72 hour(s))  Glucose, capillary     Status: Abnormal   Collection Time: 05/28/18  5:04 PM  Result Value Ref Range   Glucose-Capillary 136 (H) 70 - 99 mg/dL   Comment 1 Notify RN   Glucose, capillary     Status: Abnormal   Collection Time: 05/28/18 10:16 PM  Result Value Ref Range   Glucose-Capillary 140 (H) 70 - 99 mg/dL   Comment 1 Notify RN   Urine Culture     Status: None   Collection Time: 05/29/18  3:20 AM  Result Value Ref Range   Specimen Description URINE, RANDOM    Special Requests NONE    Culture      NO GROWTH Performed at [facility], [place] [address] [place], [place] [address]    Report Status 05/31/2018 FINAL   T4, free     Status: None   Collection Time: 05/29/18  9:46 AM  Result Value Ref Range   Free T4 0.90 0.82 - 1.77 ng/dL    Comment: (NOTE) Biotin ingestion may interfere with free T4 tests. If the results are inconsistent with the TSH level, previous test results, or the clinical presentation, then consider biotin interference. If needed, order repeat testing after stopping biotin. Performed at [facility], [place] [address] [place], [place] [address]   T3, free     Status: Abnormal   Collection Time: 05/29/18  9:46 AM  Result Value Ref Range   T3, Free 0.5 (L) 2.0 - 4.4 pg/mL    Comment: (NOTE) Performed At: [facility] [place], [place] [id number] Rush Farmer MD [id number] Performed at [facility], [place] [address], [place], [place] [address]   CBC     Status: Abnormal   Collection Time: 05/29/18  9:46 AM  Result Value Ref Range   WBC 12.0 (H) 4.0 - 10.5 K/uL   RBC 3.13 (L) 3.87 - 5.11 MIL/uL   Hemoglobin 9.2 (L) 12.0 - 15.0 g/dL   HCT 29.0 (L) 36.0 - 46.0 %   MCV 92.7 78.0 - 100.0 fL   MCH 29.4 26.0 - 34.0 pg   MCHC 31.7 30.0 - 36.0 g/dL   RDW 16.1 (H) 11.5 - 15.5 %   Platelets 156 150 - 400 K/uL    Comment: Performed at [facility], [place] [address], [place], [place] [address]  Comprehensive metabolic panel     Status: Abnormal   Collection Time: 05/29/18  9:46 AM  Result Value Ref Range   Sodium 140 135 - 145 mmol/L   Potassium 4.0 3.5 - 5.1 mmol/L   Chloride 105 98 - 111 mmol/L    Comment: Please note change in reference range.   CO2 24 22 - 32 mmol/L   Glucose, Bld 128 (H) 70 - 99 mg/dL    Comment: Please note change in reference  range.   BUN 42 (H) 8 - 23 mg/dL    Comment: Please note change in reference range.   Creatinine, Ser 1.86 (H) 0.44 - 1.00 mg/dL   Calcium 9.6 8.9 - 10.3 mg/dL   Total Protein 5.5 (L) 6.5 - 8.1 g/dL   Albumin 2.3 (L) 3.5 - 5.0 g/dL   AST 77 (H) 15 - 41 U/L   ALT 35 0 - 44 U/L    Comment: Please note change in reference range.   Alkaline Phosphatase 102 38 - 126 U/L   Total Bilirubin 0.6 0.3 - 1.2 mg/dL   GFR calc non Af Amer 26 (L) >60 mL/min   GFR calc Af Amer 30 (L) >60 mL/min    Comment: (NOTE) The eGFR has been calculated using the CKD EPI equation. This calculation has not been validated in all clinical situations. eGFR's persistently <60 mL/min signify possible Chronic Kidney Disease.    Anion gap 11 5 - 15    Comment: Performed at [facility] [address], [place], [place] [address]  TSH     Status: None   Collection Time: 05/29/18  9:46 AM  Result Value Ref Range   TSH 0.706 0.350 -  4.500 uIU/mL    Comment: Performed by a 3rd Generation assay with a functional sensitivity of <=0.01 uIU/mL. Performed at [facility], [place] [address] [place], [place] [address]   Glucose, capillary     Status: Abnormal   Collection Time: 05/29/18 11:56 AM  Result Value Ref Range   Glucose-Capillary 118 (H) 70 - 99 mg/dL  Glucose, capillary     Status: Abnormal   Collection Time: 05/29/18  4:38 PM  Result Value Ref Range   Glucose-Capillary 121 (H) 70 - 99 mg/dL  Glucose, capillary     Status: Abnormal   Collection Time: 05/29/18  9:09 PM  Result Value Ref Range   Glucose-Capillary 117 (H) 70 - 99 mg/dL  Urinalysis, Complete w Microscopic     Status: Abnormal   Collection Time: 05/30/18  1:00 AM  Result Value Ref Range   Color, Urine YELLOW YELLOW   APPearance CLEAR CLEAR   Specific Gravity, Urine 1.008 1.005 - 1.030   pH 5.0 5.0 - 8.0   Glucose, UA NEGATIVE NEGATIVE mg/dL   Hgb urine dipstick MODERATE (A) NEGATIVE   Bilirubin Urine NEGATIVE NEGATIVE   Ketones, ur NEGATIVE NEGATIVE mg/dL   Protein, ur 30 (A) NEGATIVE mg/dL   Nitrite NEGATIVE NEGATIVE   Leukocytes, UA NEGATIVE NEGATIVE   RBC / HPF 0-5 0 - 5 RBC/hpf   WBC, UA 0-5 0 - 5 WBC/hpf   Bacteria, UA NONE SEEN NONE SEEN   Squamous Epithelial / LPF 0-5 0 - 5    Comment: Performed at [facility], [place] [address], [place], [place] [address]  Basic metabolic panel     Status: Abnormal   Collection Time: 05/30/18  3:29 AM  Result Value Ref Range   Sodium 140 135 - 145 mmol/L   Potassium 3.6 3.5 - 5.1 mmol/L   Chloride 106 98 - 111 mmol/L    Comment: Please note change in reference range.   CO2 25 22 - 32 mmol/L   Glucose, Bld 128 (H) 70 - 99 mg/dL    Comment: Please note change in reference range.   BUN 39 (H) 8 - 23 mg/dL    Comment: Please note change in reference range.   Creatinine, Ser 1.76 (H) 0.44 -  1.00 mg/dL   Calcium 9.2 8.9 - 10.3 mg/dL   GFR calc non Af Amer 27 (L) >60 mL/min   GFR calc Af  Amer 32 (L) >60 mL/min    Comment: (NOTE) The eGFR has been calculated using the CKD EPI equation. This calculation has not been validated in all clinical situations. eGFR's persistently <60 mL/min signify possible Chronic Kidney Disease.    Anion gap 9 5 - 15    Comment: Performed at [facility] [address], [place], [place] [address]  Glucose, capillary     Status: Abnormal   Collection Time: 05/30/18  6:39 AM  Result Value Ref Range   Glucose-Capillary 108 (H) 70 - 99 mg/dL  Occult blood card to lab, stool RN will collect     Status: Abnormal   Collection Time: 05/30/18 10:04 AM  Result Value Ref Range   Fecal Occult Bld POSITIVE (A) NEGATIVE    Comment: Performed at [facility], [place] [address], [place], [place] [address]  Glucose, capillary     Status: None   Collection Time: 05/30/18 12:09 PM  Result Value Ref Range   Glucose-Capillary 94 70 - 99 mg/dL  Glucose, capillary     Status: Abnormal   Collection Time: 05/30/18  4:49 PM  Result Value Ref Range   Glucose-Capillary 141 (H) 70 - 99 mg/dL  Glucose, capillary     Status: Abnormal   Collection Time: 05/30/18  9:47 PM  Result Value Ref Range   Glucose-Capillary 146 (H) 70 - 99 mg/dL   Comment 1 Notify RN   Occult blood card to lab, stool RN will collect     Status: Abnormal   Collection Time: 05/31/18  1:02 AM  Result Value Ref Range   Fecal Occult Bld POSITIVE (A) NEGATIVE    Comment: Performed at [facility], [place] [address] [place], [place] [address]  Basic metabolic panel     Status: Abnormal   Collection Time: 05/31/18  3:41 AM  Result Value Ref Range   Sodium 135 135 - 145 mmol/L   Potassium 3.1 (L) 3.5 - 5.1 mmol/L   Chloride 101 98 - 111 mmol/L    Comment: Please note change in reference range.   CO2 26 22 - 32 mmol/L   Glucose, Bld 119 (H) 70 - 99 mg/dL    Comment: Please note change in reference range.   BUN 34 (H) 8 - 23 mg/dL    Comment: Please note change in reference range.    Creatinine, Ser 1.67 (H) 0.44 - 1.00 mg/dL   Calcium 8.7 (L) 8.9 - 10.3 mg/dL   GFR calc non Af Amer 29 (L) >60 mL/min   GFR calc Af Amer 34 (L) >60 mL/min    Comment: (NOTE) The eGFR has been calculated using the CKD EPI equation. This calculation has not been validated in all clinical situations. eGFR's persistently <60 mL/min signify possible Chronic Kidney Disease.    Anion gap 8 5 - 15    Comment: Performed at [facility] [address], [place] [address]  CBC with Differential/Platelet     Status: Abnormal   Collection Time: 05/31/18  3:41 AM  Result Value Ref Range   WBC 11.1 (H) 4.0 - 10.5 K/uL   RBC 3.09 (L) 3.87 - 5.11 MIL/uL   Hemoglobin 9.2 (L) 12.0 - 15.0 g/dL   HCT 28.2 (L) 36.0 - 46.0 %   MCV 91.3 78.0 - 100.0  fL   MCH 29.8 26.0 - 34.0 pg   MCHC 32.6 30.0 - 36.0 g/dL   RDW 15.9 (H) 11.5 - 15.5 %   Platelets 153 150 - 400 K/uL   Neutrophils Relative % 87 %   Neutro Abs 9.7 (H) 1.7 - 7.7 K/uL   Lymphocytes Relative 5 %   Lymphs Abs 0.5 (L) 0.7 - 4.0 K/uL   Monocytes Relative 6 %   Monocytes Absolute 0.7 0.1 - 1.0 K/uL   Eosinophils Relative 1 %   Eosinophils Absolute 0.1 0.0 - 0.7 K/uL   Basophils Relative 0 %   Basophils Absolute 0.0 0.0 - 0.1 K/uL   Immature Granulocytes 1 %   Abs Immature Granulocytes 0.1 0.0 - 0.1 K/uL    Comment: Performed at [facility] [address], [place], [place] [address]  Glucose, capillary     Status: Abnormal   Collection Time: 05/31/18  6:26 AM  Result Value Ref Range   Glucose-Capillary 114 (H) 70 - 99 mg/dL   Comment 1 Notify RN      Constitutional: No distress . Vital signs reviewed. HENT: Normocephalic.  Atraumatic. Eyes: EOMI. No discharge. Cardiovascular: RRR. No JVD. Respiratory: CTA Bilaterally.  Normal effort. GI: BS +. Non-distended. Musc: generalized edema, stable Neuro: Alert Motor:  Bilateral upper extremities: 4+/5 proximal distal Bilateral lower extremities: Hip flexion  1+/5, knee extension 3/5, ankle dorsiflexion 5/5 (left stronger than right, stable) Skin: Warm and dry. Intact. Psych: Normal mood.  Normal behavior.  Assessment/Plan: 1. Functional deficits secondary to L4-5 Lumbar discitis which require 3+ hours per day of interdisciplinary therapy in a comprehensive inpatient rehab setting. Physiatrist is providing close team supervision and 24 hour management of active medical problems listed below. Physiatrist and rehab team continue to assess barriers to discharge/monitor patient progress toward functional and medical goals. FIM: Function - Bathing Bathing activity did not occur: Refused Position: Bed Body parts bathed by patient: Right arm, Left arm, Chest, Abdomen, Buttocks, Left upper leg, Left lower leg, Right lower leg, Back Body parts bathed by helper: Front perineal area, Right upper leg Assist Level: 2 helpers  Function- Upper Body Dressing/Undressing Upper body dressing/undressing activity did not occur: Refused What is the patient wearing?: Pull over shirt/dress Pull over shirt/dress - Perfomed by helper: Thread/unthread right sleeve, Thread/unthread left sleeve, Put head through opening, Pull shirt over trunk Orthosis activity level: Performed by helper Assist Level: Touching or steadying assistance(Pt > 75%) Function - Lower Body Dressing/Undressing Lower body dressing/undressing activity did not occur: Refused What is the patient wearing?: Pants, Non-skid slipper socks Position: Bed Pants- Performed by helper: Thread/unthread right pants leg, Thread/unthread left pants leg, Pull pants up/down Non-skid slipper socks- Performed by helper: Don/doff right sock, Don/doff left sock Assist for lower body dressing: 2 Helpers  Function - Toileting Toileting activity did not occur: Safety/medical concerns Toileting steps completed by helper: Adjust clothing prior to toileting, Performs perineal hygiene, Adjust clothing after toileting Assist  level: Two helpers  Function - Air cabin crew transfer activity did not occur: Safety/medical concerns Toilet transfer assistive device: Elevated toilet seat/BSC over toilet(STEDY +2) Assist level to toilet: Maximal assist (Pt 25 - 49%/lift and lower) Assist level from toilet: Maximal assist (Pt 25 - 49%/lift and lower)  Function - Chair/bed transfer Chair/bed transfer activity did not occur: Refused Chair/bed transfer method: Lateral scoot, Other Chair/bed transfer assist level: 2 helpers  Function - Locomotion: Wheelchair Type: Manual Wheelchair activity did not occur: Refused Max wheelchair distance:  125 Assist Level: Touching or steadying assistance (Pt > 75%) Wheel 50 feet with 2 turns activity did not occur: Refused Assist Level: Touching or steadying assistance (Pt > 75%) Wheel 150 feet activity did not occur: Refused Function - Locomotion: Ambulation Ambulation activity did not occur: Safety/medical concerns Walk 10 feet activity did not occur: Safety/medical concerns Walk 50 feet with 2 turns activity did not occur: Safety/medical concerns Walk 150 feet activity did not occur: Safety/medical concerns Walk 10 feet on uneven surfaces activity did not occur: Safety/medical concerns  Function - Comprehension Comprehension: Auditory Comprehension assist level: Understands basic 50 - 74% of the time/ requires cueing 25 - 49% of the time  Function - Expression Expression: Verbal Expression assist level: Expresses basic 75 - 89% of the time/requires cueing 10 - 24% of the time. Needs helper to occlude trach/needs to repeat words.  Function - Social Interaction Social Interaction assist level: Interacts appropriately 25 - 49% of time - Needs frequent redirection.  Function - Problem Solving Problem solving assist level: Solves basic less than 25% of the time - needs direction nearly all the time or does not effectively solve problems and may need a restraint for  safety  Function - Memory Memory assist level: Recognizes or recalls 50 - 74% of the time/requires cueing 25 - 49% of the time Patient normally able to recall (first 3 days only): That he or she is in a hospital  Medical Problem List and Plan:  1. Functional deficits secondary to L4-5 osteomyelitis/discitis, lumbar spondylosis   Continue CIR 2. DVT Prophylaxis/Anticoagulation: Mechanical: Sequential compression devices, below knee Bilateral lower extremities   Doppler negative for DVT in LE  LUE doppler ordered to evaluate for DVT, negative 3. Pain Management: Scheduled oxycodone prior to therapy to help with activity tolerance, decreased to 5 mg on 6/27.   Added ultram prn for moderate pain. Monitor and titrate medications to help with neuropathic symptoms. Decadron d/ced.   RLE radicular pain, partially improved on gabapentin to 616m BID, decreased to 300 mg twice a day, DC'd on 6/27 due to lethargy and tremors   Low dose dexamethasone DC'd on 6/26  Baclofen changed to 5 mg daily at bedtime, patient unable to tolerate higher doses, DC'd on 6/27 due to lethargy 4. Mood: LCSW to follow for evaluation and support.  5. Neuropsych: This patient is capable of making decisions on her own behalf.  6. Skin/Wound Care: routine pressure relief measures  7. Fluids/Electrolytes/Nutrition: Monitor I/O.   8. Lumbar diskitis: Afebrile on scheduled tylenol. On Daptomycin and ceftriaxone. Steroids may make it more difficult to fight infx, pt very uncomfortable and restarted low dose for limited time, DC'd on 6/26 9. Acute on chronic anemia: Treated with iron and procrit on outpatient basis. Baseline Hgb- 9-10 range? Will continue to monitor serially   Hemoglobin 9.2 on 6/30  Hemoccult positive, however hemoglobin stable  Continue to monitor 10. Symptomatic Bradycardia s/p PPM: Monitor HR bid.   Controlled at present 11. GIB with thrombocytopenia: Resolved  Cont to monitor 12. New diagnosis T2DM: Hgb  A1c- 6.3. Poorly controlled due to recent rounds of steroids--Lantus was titrated upwards for better control. Was not on any medications at home. Monitor BS ac/hs--watch for hypoglycemic episodes as steroids tapered off.  CBG (last 3)  Recent Labs    05/30/18 1649 05/30/18 2147 05/31/18 0626  GLUCAP 141* 146* 114*   Slightly labile, but overall controlled on 6/30 13. Neuropathy BLE: Continue Gabapentin bid--monitor for SE.  14. Hyponatremia:  Resolved  Added protein supplements with meals. monitor for now.   Continue to monitor 15. Stage IV CKD: Baseline SCr 3.5-4.0. Monitor for SE/asterixis with narcotics.   Creatinine 1.67 on 6/30  Encourage fluids, IVF DC'd on 6/30  Continue to monitor 16. Fluid overload: Monitor daily weights and for signs of overload.   Lasix increased to 80 mg daily on 6/27 (PTCA 80 twice a day) Filed Weights   05/29/18 1520 05/30/18 0500 05/31/18 0551  Weight: 101.3 kg (223 lb 5.2 oz) 99.6 kg (219 lb 9.3 oz) 94.4 kg (208 lb 1.8 oz)   ?Reliability on 6/30 17. Morbid obesity  Encourage weight loss 18. Essential hypertension  Continue Norvasc 10 mg daily  Hydralazine 50 mg 3 times a day, increased to 75 on 6/24  Labile, but?  Improving on 6/30 19. Hypoalbuminemia  Supplement initiated 20. Hypokalemia  Potassium 3.1 on 6/30, supplement initiated  Continue to monitor 21. AMS: Improving  Likely medication induced  CXR reviewed, ECG reviewed, head CT reviewed, EEG performed - ? Borderline QTC prolongation  Discussed with pharmacy  TSH, free T4 within acceptable range  Free T3 low  Continues to have low grade fever  UA unremarkable.  Urine culture no growth  WBCs 11.1 on 6/30  Blood cultures pending 22. Hypothyroidism  Continue meds   See #21 23.  Sleep disturbance  Melatonin initiated on 6/30  LOS (Days) 9 A FACE TO FACE EVALUATION WAS PERFORMED  [place] [place] [name] 05/31/2018, 11:54 AM

## 2018-05-31 NOTE — Progress Notes (Signed)
Patient complains of unable to breath 02 sat 94% ; anxious of what time she will take her sleeping med claims she's trying her best not fall asleep so she will be able to sleep tonight get rested for PT in the morning.

## 2018-06-01 ENCOUNTER — Inpatient Hospital Stay (HOSPITAL_COMMUNITY): Payer: Medicare Other | Admitting: Occupational Therapy

## 2018-06-01 ENCOUNTER — Inpatient Hospital Stay (HOSPITAL_COMMUNITY): Payer: Medicare Other

## 2018-06-01 DIAGNOSIS — I169 Hypertensive crisis, unspecified: Secondary | ICD-10-CM

## 2018-06-01 LAB — GLUCOSE, CAPILLARY
GLUCOSE-CAPILLARY: 102 mg/dL — AB (ref 70–99)
Glucose-Capillary: 141 mg/dL — ABNORMAL HIGH (ref 70–99)
Glucose-Capillary: 145 mg/dL — ABNORMAL HIGH (ref 70–99)
Glucose-Capillary: 108 mg/dL — ABNORMAL HIGH (ref 70–99)

## 2018-06-01 MED ORDER — PREMIER PROTEIN SHAKE
2.0000 [oz_av] | Freq: Three times a day (TID) | ORAL | Status: DC
  Administered 2018-06-01 – 2018-06-08 (×20): 2 [oz_av] via ORAL
  Filled 2018-06-01 (×27): qty 325.31

## 2018-06-01 NOTE — Progress Notes (Signed)
Subjective/Complaints: Pt seen lying in bed this morning. She states he slept well overnight. She states she feels better overall.  ROS: denies CP, S OB, nausea, vomiting diarrhea.  Objective: Vital Signs: Blood pressure (!) 150/77, pulse 94, temperature 98.4 F (36.9 C), temperature source Oral, resp. rate 20, height '5\' 5"'$  (1.651 m), weight 93.3 kg (205 lb 11 oz), SpO2 93 %. No results found. Results for orders placed or performed during the hospital encounter of 05/22/18 (from the past 72 hour(s))  T4, free     Status: None   Collection Time: 05/29/18  9:46 AM  Result Value Ref Range   Free T4 0.90 0.82 - 1.77 ng/dL    Comment: (NOTE) Biotin ingestion may interfere with free T4 tests. If the results are inconsistent with the TSH level, previous test results, or the clinical presentation, then consider biotin interference. If needed, order repeat testing after stopping biotin. Performed at [facility], [place] [address], [place], [place] [address]   T3, free     Status: Abnormal   Collection Time: 05/29/18  9:46 AM  Result Value Ref Range   T3, Free 0.5 (L) 2.0 - 4.4 pg/mL    Comment: (NOTE) Performed At: [facility] [place], [place] [id number] Rush Farmer MD [id number] Performed at [facility], [place] [address] [place], [place] [address]   CBC     Status: Abnormal   Collection Time: 05/29/18  9:46 AM  Result Value Ref Range   WBC 12.0 (H) 4.0 - 10.5 K/uL   RBC 3.13 (L) 3.87 - 5.11 MIL/uL   Hemoglobin 9.2 (L) 12.0 - 15.0 g/dL   HCT 29.0 (L) 36.0 - 46.0 %   MCV 92.7 78.0 - 100.0 fL   MCH 29.4 26.0 - 34.0 pg   MCHC 31.7 30.0 - 36.0 g/dL   RDW 16.1 (H) 11.5 - 15.5 %   Platelets 156 150 - 400 K/uL    Comment: Performed at [facility], [place] [address], [place], [place] [address]  Comprehensive metabolic panel     Status: Abnormal   Collection Time: 05/29/18  9:46 AM  Result Value Ref Range   Sodium 140 135 - 145 mmol/L   Potassium 4.0 3.5 - 5.1 mmol/L   Chloride 105 98 - 111 mmol/L    Comment: Please note change in reference range.   CO2 24 22 - 32 mmol/L   Glucose, Bld 128 (H) 70 - 99 mg/dL    Comment: Please note change in reference range.   BUN 42 (H) 8 - 23 mg/dL    Comment: Please note change in reference range.   Creatinine, Ser 1.86 (H) 0.44 - 1.00 mg/dL   Calcium 9.6 8.9 - 10.3 mg/dL   Total Protein 5.5 (L) 6.5 - 8.1 g/dL   Albumin 2.3 (L) 3.5 - 5.0 g/dL   AST 77 (H) 15 - 41 U/L   ALT 35 0 - 44 U/L    Comment: Please note change in reference range.   Alkaline Phosphatase 102 38 - 126 U/L   Total Bilirubin 0.6 0.3 - 1.2 mg/dL   GFR calc non Af Amer 26 (L) >60 mL/min   GFR calc Af Amer 30 (L) >60 mL/min    Comment: (NOTE) The eGFR has been calculated using the CKD EPI equation. This calculation has not been validated in all clinical situations. eGFR's persistently <60 mL/min signify possible Chronic Kidney Disease.    Anion gap 11 5 -  15    Comment: Performed at [facility], [place] [address], [place], [place] [address]  TSH     Status: None   Collection Time: 05/29/18  9:46 AM  Result Value Ref Range   TSH 0.706 0.350 - 4.500 uIU/mL    Comment: Performed by a 3rd Generation assay with a functional sensitivity of <=0.01 uIU/mL. Performed at [facility], [place] [address], [place], [place] [address]   Glucose, capillary     Status: Abnormal   Collection Time: 05/29/18 11:56 AM  Result Value Ref Range   Glucose-Capillary 118 (H) 70 - 99 mg/dL  Glucose, capillary     Status: Abnormal   Collection Time: 05/29/18  4:38 PM  Result Value Ref Range   Glucose-Capillary 121 (H) 70 - 99 mg/dL  Glucose, capillary     Status: Abnormal   Collection Time: 05/29/18  9:09 PM  Result Value Ref Range   Glucose-Capillary 117 (H) 70 - 99 mg/dL  Urinalysis, Complete w Microscopic     Status: Abnormal   Collection Time: 05/30/18  1:00 AM  Result Value Ref Range   Color, Urine YELLOW YELLOW    APPearance CLEAR CLEAR   Specific Gravity, Urine 1.008 1.005 - 1.030   pH 5.0 5.0 - 8.0   Glucose, UA NEGATIVE NEGATIVE mg/dL   Hgb urine dipstick MODERATE (A) NEGATIVE   Bilirubin Urine NEGATIVE NEGATIVE   Ketones, ur NEGATIVE NEGATIVE mg/dL   Protein, ur 30 (A) NEGATIVE mg/dL   Nitrite NEGATIVE NEGATIVE   Leukocytes, UA NEGATIVE NEGATIVE   RBC / HPF 0-5 0 - 5 RBC/hpf   WBC, UA 0-5 0 - 5 WBC/hpf   Bacteria, UA NONE SEEN NONE SEEN   Squamous Epithelial / LPF 0-5 0 - 5    Comment: Performed at [facility], [place] [address], [place], [place] [address]  Basic metabolic panel     Status: Abnormal   Collection Time: 05/30/18  3:29 AM  Result Value Ref Range   Sodium 140 135 - 145 mmol/L   Potassium 3.6 3.5 - 5.1 mmol/L   Chloride 106 98 - 111 mmol/L    Comment: Please note change in reference range.   CO2 25 22 - 32 mmol/L   Glucose, Bld 128 (H) 70 - 99 mg/dL    Comment: Please note change in reference range.   BUN 39 (H) 8 - 23 mg/dL    Comment: Please note change in reference range.   Creatinine, Ser 1.76 (H) 0.44 - 1.00 mg/dL   Calcium 9.2 8.9 - 10.3 mg/dL   GFR calc non Af Amer 27 (L) >60 mL/min   GFR calc Af Amer 32 (L) >60 mL/min    Comment: (NOTE) The eGFR has been calculated using the CKD EPI equation. This calculation has not been validated in all clinical situations. eGFR's persistently <60 mL/min signify possible Chronic Kidney Disease.    Anion gap 9 5 - 15    Comment: Performed at [facility] [address], [place], [place] [address]  Glucose, capillary     Status: Abnormal   Collection Time: 05/30/18  6:39 AM  Result Value Ref Range   Glucose-Capillary 108 (H) 70 - 99 mg/dL  Occult blood card to lab, stool RN will collect     Status: Abnormal   Collection Time: 05/30/18 10:04 AM  Result Value Ref Range   Fecal Occult Bld POSITIVE (A) NEGATIVE    Comment: Performed at [facility], [place] [address] [place], [place] [address]  Glucose,  capillary     Status: None   Collection Time: 05/30/18 12:09 PM  Result Value Ref Range   Glucose-Capillary 94 70 - 99 mg/dL  Glucose, capillary     Status: Abnormal   Collection Time: 05/30/18  4:49 PM  Result Value Ref Range   Glucose-Capillary 141 (H) 70 - 99 mg/dL  Culture, blood (routine x 2)     Status: None (Preliminary result)   Collection Time: 05/30/18  6:43 PM  Result Value Ref Range   Specimen Description BLOOD RIGHT ARM    Special Requests      BOTTLES DRAWN AEROBIC AND ANAEROBIC Blood Culture results may not be optimal due to an inadequate volume of blood received in culture bottles   Culture      NO GROWTH < 24 HOURS Performed at [facility] [address], [place], [place] [address]    Report Status PENDING   Culture, blood (routine x 2)     Status: None (Preliminary result)   Collection Time: 05/30/18  6:53 PM  Result Value Ref Range   Specimen Description BLOOD RIGHT WRIST    Special Requests      BOTTLES DRAWN AEROBIC AND ANAEROBIC Blood Culture results may not be optimal due to an inadequate volume of blood received in culture bottles   Culture      NO GROWTH < 24 HOURS Performed at [facility] [address], [place], [place] [address]    Report Status PENDING   Glucose, capillary     Status: Abnormal   Collection Time: 05/30/18  9:47 PM  Result Value Ref Range   Glucose-Capillary 146 (H) 70 - 99 mg/dL   Comment 1 Notify RN   Occult blood card to lab, stool RN will collect     Status: Abnormal   Collection Time: 05/31/18  1:02 AM  Result Value Ref Range   Fecal Occult Bld POSITIVE (A) NEGATIVE    Comment: Performed at [facility] [address], [place], [place] [address]  Basic metabolic panel     Status: Abnormal   Collection Time: 05/31/18  3:41 AM  Result Value Ref Range   Sodium 135 135 - 145 mmol/L   Potassium 3.1 (L) 3.5 - 5.1 mmol/L   Chloride 101 98 - 111 mmol/L    Comment: Please note change in reference range.    CO2 26 22 - 32 mmol/L   Glucose, Bld 119 (H) 70 - 99 mg/dL    Comment: Please note change in reference range.   BUN 34 (H) 8 - 23 mg/dL    Comment: Please note change in reference range.   Creatinine, Ser 1.67 (H) 0.44 - 1.00 mg/dL   Calcium 8.7 (L) 8.9 - 10.3 mg/dL   GFR calc non Af Amer 29 (L) >60 mL/min   GFR calc Af Amer 34 (L) >60 mL/min    Comment: (NOTE) The eGFR has been calculated using the CKD EPI equation. This calculation has not been validated in all clinical situations. eGFR's persistently <60 mL/min signify possible Chronic Kidney Disease.    Anion gap 8 5 - 15    Comment: Performed at [facility] [address] [place], [place] [address]  CBC with Differential/Platelet     Status: Abnormal   Collection Time: 05/31/18  3:41 AM  Result Value Ref Range   WBC 11.1 (H) 4.0 - 10.5  K/uL   RBC 3.09 (L) 3.87 - 5.11 MIL/uL   Hemoglobin 9.2 (L) 12.0 - 15.0 g/dL   HCT 28.2 (L) 36.0 - 46.0 %   MCV 91.3 78.0 - 100.0 fL   MCH 29.8 26.0 - 34.0 pg   MCHC 32.6 30.0 - 36.0 g/dL   RDW 15.9 (H) 11.5 - 15.5 %   Platelets 153 150 - 400 K/uL   Neutrophils Relative % 87 %   Neutro Abs 9.7 (H) 1.7 - 7.7 K/uL   Lymphocytes Relative 5 %   Lymphs Abs 0.5 (L) 0.7 - 4.0 K/uL   Monocytes Relative 6 %   Monocytes Absolute 0.7 0.1 - 1.0 K/uL   Eosinophils Relative 1 %   Eosinophils Absolute 0.1 0.0 - 0.7 K/uL   Basophils Relative 0 %   Basophils Absolute 0.0 0.0 - 0.1 K/uL   Immature Granulocytes 1 %   Abs Immature Granulocytes 0.1 0.0 - 0.1 K/uL    Comment: Performed at [facility] [address] [place], [place] [address]  Glucose, capillary     Status: Abnormal   Collection Time: 05/31/18  6:26 AM  Result Value Ref Range   Glucose-Capillary 114 (H) 70 - 99 mg/dL   Comment 1 Notify RN   Glucose, capillary     Status: Abnormal   Collection Time: 05/31/18 12:00 PM  Result Value Ref Range   Glucose-Capillary 149 (H) 70 - 99 mg/dL  Glucose, capillary     Status:  Abnormal   Collection Time: 05/31/18  4:40 PM  Result Value Ref Range   Glucose-Capillary 166 (H) 70 - 99 mg/dL  Glucose, capillary     Status: Abnormal   Collection Time: 05/31/18  8:16 PM  Result Value Ref Range   Glucose-Capillary 153 (H) 70 - 99 mg/dL  Glucose, capillary     Status: Abnormal   Collection Time: 06/01/18  6:34 AM  Result Value Ref Range   Glucose-Capillary 102 (H) 70 - 99 mg/dL     Constitutional: No distress . Vital signs reviewed. HENT: Normocephalic.  Atraumatic. Eyes: EOMI. No discharge. Cardiovascular: RRR. No JVD. Respiratory: CTA Bilaterally. Normal effort. GI: BS +. Non-distended. Musc: generalized edema, stable Neuro: Alert Motor:  Bilateral upper extremities: 4+/5 proximal distal Bilateral lower extremities: Hip flexion 1+/5, knee extension 3/5, ankle dorsiflexion 5/5 (left stronger than right, unchanged) Skin: Warm and dry. Intact. Psych: Normal mood.  Normal behavior.  Assessment/Plan: 1. Functional deficits secondary to L4-5 Lumbar discitis which require 3+ hours per day of interdisciplinary therapy in a comprehensive inpatient rehab setting. Physiatrist is providing close team supervision and 24 hour management of active medical problems listed below. Physiatrist and rehab team continue to assess barriers to discharge/monitor patient progress toward functional and medical goals. FIM: Function - Bathing Bathing activity did not occur: Refused Position: Bed Body parts bathed by patient: Right arm, Left arm, Chest, Abdomen, Buttocks, Left upper leg, Left lower leg, Right lower leg, Back Body parts bathed by helper: Front perineal area, Right upper leg Assist Level: 2 helpers  Function- Upper Body Dressing/Undressing Upper body dressing/undressing activity did not occur: Refused What is the patient wearing?: Pull over shirt/dress Pull over shirt/dress - Perfomed by helper: Thread/unthread right sleeve, Thread/unthread left sleeve, Put head  through opening, Pull shirt over trunk Orthosis activity level: Performed by helper Assist Level: Touching or steadying assistance(Pt > 75%) Function - Lower Body Dressing/Undressing Lower body dressing/undressing activity did not occur: Refused What is the patient wearing?: Pants, Non-skid  slipper socks Position: Bed Pants- Performed by helper: Thread/unthread right pants leg, Thread/unthread left pants leg, Pull pants up/down Non-skid slipper socks- Performed by helper: Don/doff right sock, Don/doff left sock Assist for lower body dressing: 2 Helpers  Function - Toileting Toileting activity did not occur: Safety/medical concerns Toileting steps completed by helper: Adjust clothing prior to toileting, Performs perineal hygiene, Adjust clothing after toileting Assist level: Two helpers  Function - Air cabin crew transfer activity did not occur: Safety/medical concerns Toilet transfer assistive device: Elevated toilet seat/BSC over toilet(STEDY +2) Assist level to toilet: Maximal assist (Pt 25 - 49%/lift and lower) Assist level from toilet: Maximal assist (Pt 25 - 49%/lift and lower)  Function - Chair/bed transfer Chair/bed transfer activity did not occur: Refused Chair/bed transfer method: Lateral scoot, Other Chair/bed transfer assist level: 2 helpers  Function - Locomotion: Wheelchair Type: Educational psychologist activity did not occur: Refused Max wheelchair distance: 125 Assist Level: Touching or steadying assistance (Pt > 75%) Wheel 50 feet with 2 turns activity did not occur: Refused Assist Level: Touching or steadying assistance (Pt > 75%) Wheel 150 feet activity did not occur: Refused Function - Locomotion: Ambulation Ambulation activity did not occur: Safety/medical concerns Walk 10 feet activity did not occur: Safety/medical concerns Walk 50 feet with 2 turns activity did not occur: Safety/medical concerns Walk 150 feet activity did not occur: Safety/medical  concerns Walk 10 feet on uneven surfaces activity did not occur: Safety/medical concerns  Function - Comprehension Comprehension: Auditory Comprehension assist level: Understands basic 90% of the time/cues < 10% of the time  Function - Expression Expression: Verbal Expression assist level: Expresses basic 90% of the time/requires cueing < 10% of the time.  Function - Social Interaction Social Interaction assist level: Interacts appropriately 25 - 49% of time - Needs frequent redirection.  Function - Problem Solving Problem solving assist level: Solves basic 50 - 74% of the time/requires cueing 25 - 49% of the time  Function - Memory Memory assist level: Recognizes or recalls 90% of the time/requires cueing < 10% of the time Patient normally able to recall (first 3 days only): That he or she is in a hospital, Staff names and faces, Current season  Medical Problem List and Plan:  1. Functional deficits secondary to L4-5 osteomyelitis/discitis, lumbar spondylosis   Continue CIR 2. DVT Prophylaxis/Anticoagulation: Mechanical: Sequential compression devices, below knee Bilateral lower extremities   Doppler negative for DVT in LE  LUE doppler ordered to evaluate for DVT, negative 3. Pain Management: Scheduled oxycodone prior to therapy to help with activity tolerance, decreased to 5 mg on 6/27.   Added ultram prn for moderate pain. Monitor and titrate medications to help with neuropathic symptoms. Decadron d/ced.   RLE radicular pain, partially improved on gabapentin to 656m BID, decreased to 300 mg twice a day, DC'd on 6/27 due to lethargy and tremors   Low dose dexamethasone DC'd on 6/26  Baclofen changed to 5 mg daily at bedtime, patient unable to tolerate higher doses, DC'd on 6/27 due to lethargy 4. Mood: LCSW to follow for evaluation and support.  5. Neuropsych: This patient is capable of making decisions on her own behalf.  6. Skin/Wound Care: routine pressure relief measures  7.  Fluids/Electrolytes/Nutrition: Monitor I/O.   8. Lumbar diskitis: Afebrile on scheduled tylenol. On Daptomycin and ceftriaxone. Steroids may make it more difficult to fight infx, pt very uncomfortable and restarted low dose for limited time, DC'd on 6/26 9. Acute on chronic anemia: Treated with iron  and procrit on outpatient basis. Baseline Hgb- 9-10 range? Will continue to monitor serially   Hemoglobin 9.2 on 6/30  Hemoccult positive, however hemoglobin stable  Continue to monitor 10. Symptomatic Bradycardia s/p PPM: Monitor HR bid.   Controlled at present 11. GIB with thrombocytopenia: Resolved  Cont to monitor 12. New diagnosis T2DM: Hgb A1c- 6.3. Poorly controlled due to recent rounds of steroids--Lantus was titrated upwards for better control. Was not on any medications at home. Monitor BS ac/hs--watch for hypoglycemic episodes as steroids tapered off.  CBG (last 3)  Recent Labs    05/31/18 1640 05/31/18 2016 06/01/18 0634  GLUCAP 166* 153* 102*   Slightly labile on 7/1 13. Neuropathy BLE: Continue Gabapentin bid--monitor for SE.  14. Hyponatremia: Resolved  Added protein supplements with meals. monitor for now.   Continue to monitor 15. Stage IV CKD: Baseline SCr 3.5-4.0. Monitor for SE/asterixis with narcotics.   Creatinine 1.67 on 6/30  Encourage fluids, IVF DC'd on 6/30  Continue to monitor 16. Fluid overload: Monitor daily weights and for signs of overload.   Lasix increased to 80 mg daily on 6/27 (PTCA 80 twice a day) Filed Weights   05/30/18 0500 05/31/18 0551 06/01/18 0435  Weight: 99.6 kg (219 lb 9.3 oz) 94.4 kg (208 lb 1.8 oz) 93.3 kg (205 lb 11 oz)   ?Reliability on 7/1 17. Morbid obesity  Encourage weight loss 18. Essential hypertension  Continue Norvasc 10 mg daily  Hydralazine 50 mg 3 times a day, increased to 75 on 6/24  Labile with hypertensive crisis yesterday 19. Hypoalbuminemia  Supplement initiated 20. Hypokalemia  Potassium 3.1 on 6/30, supplement  initiated  Continue to monitor  Labs ordered for tomorrow 21. AMS: Improving  Likely medication induced  CXR reviewed, ECG reviewed, head CT reviewed, EEG performed - ? Borderline QTC prolongation  Discussed with pharmacy  TSH, free T4 within acceptable range  Free T3 low  Continues to have low grade fever  UA unremarkable.  Urine culture no growth  WBCs 11.1 on 6/30  Blood cultures no growth to date on 7/1  Labs ordered for tomorrow 22. Hypothyroidism  Continue meds   See #21 23.  Sleep disturbance  Melatonin initiated on 6/30, increased on 7/1  LOS (Days) 10 A FACE TO FACE EVALUATION WAS PERFORMED  Ankit [name] 06/01/2018, 8:52 AM

## 2018-06-01 NOTE — Progress Notes (Signed)
Physical Therapy Session Note  Patient Details  Name: Erika Fry MRN: [id number] Date of Birth: 12-28-1945  Today's Date: 06/01/2018 PT Individual Time: 1010-1105 PT Individual Time Calculation (min): 55 min   Short Term Goals: Week 2:  PT Short Term Goal 1 (Week 2): Pt will tolerate 1 hour of sitting up in chair w/ minimal increase in pain or fatigue PT Short Term Goal 2 (Week 2): Pt will transfer bed<>chair w/ max assist x1 PT Short Term Goal 3 (Week 2): Pt will perform sit<>stand w/ LRAD, max assist x1  Skilled Therapeutic Interventions/Progress Updates:  Pt presented in w/c agreeable to therapy. Pt indicated increased R hip pain "10/10" this session. Propelled from room to rehab gym with increased time for BUE strengthening and endurance. Pt performed sit/stand in [place] w/c to mat with maxA x 2. Pt required minA for placing LLE on footrest of [place]. Pt unable to maintain upright posture while sitting in [place] despite max multimodal cues. Once transferred to mat required required max encouragement to sit unsupported as pt frequently stating "I can't". PTA providing education on use of abdominal ms vs BUE for support. Pt able to progress for minA to CGA in supported sitting for approx 2 min. Pt encouraged to perform reaching within small range however pt demonstrated decreased initiation to perform and able to do with [facility] assist from PTA. Pt returned to [facility] with maxA x 2 from elevated mat and required max multimodal cues to maintain upright posture in [place] as pt would lean on crossbar. Performed stand from [facility] x2 to return to bed and performed sit to supine maxA x 1 for BLE management. Pt positioned for comfort and left with bed alarm on, call bell within reach and needs met.      Therapy Documentation Precautions:  Precautions Precautions: Fall Precaution Comments: Pacemaker placement 4 weeks ago Required Braces or Orthoses: Spinal Brace(LSO for comfort) Spinal Brace: Lumbar  corset Restrictions Weight Bearing Restrictions: No General:   Vital Signs: Therapy Vitals BP: 138/75 Pain: Pain Assessment Pain Scale: 0-10 Pain Score: 8  Pain Type: Acute pain Pain Location: Hip Pain Orientation: Right Pain Descriptors / Indicators: Aching     See Function Navigator for Current Functional Status.   Therapy/Group: Individual Therapy  Janaia Kozel  Adilynn Bessey, PTA  06/01/2018, 12:50 PM

## 2018-06-01 NOTE — Progress Notes (Signed)
Occupational Therapy Weekly Progress Note  Patient Details  Name: Erika Fry MRN: [id number] Date of Birth: 25-Feb-1946  Beginning of progress report period: May 23, 2018 End of progress report period: June 01, 2018  Today's Date: 06/01/2018 OT Individual Time: 2595-6387 OT Individual Time Calculation (min): 63 min    Patient has met 1 of 4 short term goals.  Pt has had limited to no participation the last few days due to severe lethargy, therefore she was not able to meet many of her STGs.  Today she was alert and very responsive and participated, but her functional mobility was severely limited by R hip pain and anxiety over having pain (see notes below).  She also demonstrated decreased initiation with movement patterns such as actively putting a shirt on without mod cues and mod A despite the fact she has full AROM of BUE.  She will need this next week to get accustomed to more movement/ activity to participate more actively in her ADLS.  Patient continues to demonstrate the following deficits: muscle weakness and muscle joint tightness, decreased cardiorespiratoy endurance and decreased sitting balance, decreased standing balance, decreased postural control and decreased balance strategies and therefore will continue to benefit from skilled OT intervention to enhance overall performance with BADL.  Patient progressing toward long term goals..  Continue plan of care.  OT Short Term Goals Week 1:  OT Short Term Goal 1 (Week 1): Pt will report no more than 5/10 pain in order to participate more functionally in ADLs OT Short Term Goal 1 - Progress (Week 1): Not met OT Short Term Goal 2 (Week 1): Pt will sit EOB for >10 min with no more than mod A to participate in morning ADL routine OT Short Term Goal 2 - Progress (Week 1): Not met OT Short Term Goal 3 (Week 1): Pt will don shirt with min A  OT Short Term Goal 3 - Progress (Week 1): Not met OT Short Term Goal 4 (Week 1): Pt will  adhere to back precautions during bed mobility with no more than 2 vc  OT Short Term Goal 4 - Progress (Week 1): Met Week 2:  OT Short Term Goal 1 (Week 2): Pt will move from sidelying to sit with mod A of 1 to prepare for bathing/dressing EOB or in wc. OT Short Term Goal 2 (Week 2): Pt will maintain static sitting EOB with min A for 5-10 min. OT Short Term Goal 3 (Week 2): Pt will don shirt with min A. OT Short Term Goal 4 (Week 2): Pt will sit to stand in bariatric Stedy with mod A of 1 to prepare for transfer to w/c.  Skilled Therapeutic Interventions/Progress Updates:    Pt received in bed and was alert and very talkative stating she was going to be exhausted as she did not sleep all night last night.  Pt initially worked on LE A/AROM of hip and knee flexion with limited AROM.  Due to R hip pain pt was very guarded with RLE.  Pt worked on rolling in bed with facilitation of LEs. Pt needed mod cues for technique but was able to roll with mod A for donning of pants.  Pt sat to EOB with total A of 2 from sidelying on L to sit.  Pt was very anxious about R hip pain and leaning to the L avoiding sitting up straight. Unable to apply LSO in this position.  Pt needed back support from therapist as she was pushing  back.  Used Stedy lift with total A of 2 to pull to stand as pt was not actively using her legs to push up and would not bring hips forward.  She needed max encouragement as she was in pain and reassured she would be transitioned to w/c as quickly as possible.  Pt transferred to w/c and needed max A to scoot hips all the way back into chair.   Once in chair, pt was calm and comfortable.  She sat at sink to wash UB with cues as she believed she could not lift her arms and cues to fully pull sleeves on her arms.  She needed A to pull shirt overhead and all the way down her trunk.  Pt did demonstrate earlier that she still has full ROM of her arms.  LSO brace donned. Pt set up with her call light and  phone and all needs met.    Therapy Documentation Precautions:  Precautions Precautions: Fall Precaution Comments: Pacemaker placement 4 weeks ago Required Braces or Orthoses: Spinal Brace(LSO for comfort) Spinal Brace: Lumbar corset Restrictions Weight Bearing Restrictions: No    Vital Signs: Therapy Vitals BP: 138/75 Pain: Pain Assessment Pain Scale: 0-10 Pain Score: 8  Pain Type: Acute pain Pain Location: Hip Pain Orientation: Right Pain Descriptors / Indicators: Aching - RN notified ADL: ADL ADL Comments: See functional navigator  See Function Navigator for Current Functional Status.   Therapy/Group: Individual Therapy  [place] 06/01/2018, 12:16 PM

## 2018-06-01 NOTE — Progress Notes (Signed)
   06/01/18 1100  Clinical Encounter Type  Visited With Patient  Visit Type Initial  Referral From Nurse  Consult/Referral To Chaplain  Spiritual Encounters  Spiritual Needs Prayer;Emotional  Stress Factors  Patient Stress Factors Exhausted  Family Stress Factors Exhausted    Pt  Was laying on her bed and seemingly very exhausted after rehab. Chaplain had a meaningful discussion with Pt. Chaplain provided emotional support, compassionate presence and prayer. Pt was very receptive and appreciative.  Larissa Pegg a Medical sales representative, [employer]

## 2018-06-02 ENCOUNTER — Inpatient Hospital Stay (HOSPITAL_COMMUNITY): Payer: Medicare Other | Admitting: Physical Therapy

## 2018-06-02 ENCOUNTER — Inpatient Hospital Stay (HOSPITAL_COMMUNITY): Payer: Medicare Other | Admitting: Occupational Therapy

## 2018-06-02 ENCOUNTER — Encounter (HOSPITAL_COMMUNITY): Payer: Medicare Other | Admitting: Psychology

## 2018-06-02 LAB — BASIC METABOLIC PANEL
ANION GAP: 7 (ref 5–15)
BUN: 26 mg/dL — ABNORMAL HIGH (ref 8–23)
CO2: 27 mmol/L (ref 22–32)
CREATININE: 1.46 mg/dL — AB (ref 0.44–1.00)
Calcium: 8.7 mg/dL — ABNORMAL LOW (ref 8.9–10.3)
Chloride: 97 mmol/L — ABNORMAL LOW (ref 98–111)
GFR calc non Af Amer: 34 mL/min — ABNORMAL LOW (ref >60)
GFR, EST AFRICAN AMERICAN: 40 mL/min — AB (ref >60)
Glucose, Bld: 93 mg/dL (ref 70–99)
POTASSIUM: 3.5 mmol/L (ref 3.5–5.1)
SODIUM: 131 mmol/L — AB (ref 135–145)

## 2018-06-02 LAB — CBC WITH DIFFERENTIAL/PLATELET
Abs Immature Granulocytes: 0.1 10*3/uL (ref 0.0–0.1)
Basophils Absolute: 0 10*3/uL (ref 0.0–0.1)
Basophils Relative: 0 %
EOS ABS: 0.2 10*3/uL (ref 0.0–0.7)
Eosinophils Relative: 2 %
HEMATOCRIT: 29.2 % — AB (ref 36.0–46.0)
Hemoglobin: 9.5 g/dL — ABNORMAL LOW (ref 12.0–15.0)
IMMATURE GRANULOCYTES: 1 %
LYMPHS PCT: 6 %
Lymphs Abs: 0.6 10*3/uL — ABNORMAL LOW (ref 0.7–4.0)
MCH: 29.5 pg (ref 26.0–34.0)
MCHC: 32.5 g/dL (ref 30.0–36.0)
MCV: 90.7 fL (ref 78.0–100.0)
Monocytes Absolute: 0.6 10*3/uL (ref 0.1–1.0)
Monocytes Relative: 6 %
NEUTROS PCT: 85 %
Neutro Abs: 8.2 10*3/uL — ABNORMAL HIGH (ref 1.7–7.7)
PLATELETS: 158 10*3/uL (ref 150–400)
RBC: 3.22 MIL/uL — AB (ref 3.87–5.11)
RDW: 15.4 % (ref 11.5–15.5)
WBC: 9.7 10*3/uL (ref 4.0–10.5)

## 2018-06-02 LAB — GLUCOSE, CAPILLARY
GLUCOSE-CAPILLARY: 105 mg/dL — AB (ref 70–99)
GLUCOSE-CAPILLARY: 115 mg/dL — AB (ref 70–99)
Glucose-Capillary: 102 mg/dL — ABNORMAL HIGH (ref 70–99)
Glucose-Capillary: 101 mg/dL — ABNORMAL HIGH (ref 70–99)

## 2018-06-02 MED ORDER — ACETAMINOPHEN 325 MG PO TABS
325.0000 mg | ORAL_TABLET | Freq: Four times a day (QID) | ORAL | Status: DC | PRN
  Administered 2018-06-02 – 2018-06-09 (×5): 650 mg via ORAL
  Filled 2018-06-02 (×4): qty 2

## 2018-06-02 MED ORDER — LIDOCAINE 5 % EX PTCH
1.0000 | MEDICATED_PATCH | CUTANEOUS | Status: DC
  Administered 2018-06-02 – 2018-06-22 (×21): 1 via TRANSDERMAL
  Filled 2018-06-02 (×20): qty 1

## 2018-06-02 NOTE — Progress Notes (Signed)
Physical Therapy Session Note  Patient Details  Name: Erika Fry MRN: [id number] Date of Birth: March 21, 1946  Today's Date: 06/02/2018 PT Individual Time: 1003-1030 PT Individual Time Calculation (min): 27 min   Short Term Goals: Week 2:  PT Short Term Goal 1 (Week 2): Pt will tolerate 1 hour of sitting up in chair w/ minimal increase in pain or fatigue PT Short Term Goal 2 (Week 2): Pt will transfer bed<>chair w/ max assist x1 PT Short Term Goal 3 (Week 2): Pt will perform sit<>stand w/ LRAD, max assist x1  Skilled Therapeutic Interventions/Progress Updates: Pt presented in bed c/o 10/10 pain in R hip. Pt encouraged to participate in supine therex. Pt participated in AA heel slides, glute sets WLP, ankle pumps, quad sets x 10-12/fatigue. PTA performed grade l long axis distraction for pain management with fair tolerance. Discussed with pt relaxation/breathing techniques to work through spasms. Pt required total A for boosting to [facility] and maxA for rolling L/R with max verbal cues for sequencing both BUE and BLE. Hot pack placed at R hip for pt comfort and pt left with bed alarm on, call bell within reach and needs met.      Therapy Documentation Precautions:  Precautions Precautions: Fall Precaution Comments: Pacemaker placement 4 weeks ago Required Braces or Orthoses: Spinal Brace(LSO for comfort) Spinal Brace: Lumbar corset Restrictions Weight Bearing Restrictions: No General:   Vital Signs: Therapy Vitals Temp: 98 F (36.7 C) Temp Source: Oral Pulse Rate: 96 Resp: (!) 5 BP: 130/63 Patient Position (if appropriate): Lying Oxygen Therapy SpO2: 92 % O2 Device: Room Air Pain: Pain Assessment Pain Score: Asleep Pain Type: Acute pain Pain Location: Hip Pain Orientation: Right Pain Descriptors / Indicators: Aching Pain Intervention(s): Medication (See eMAR)   See Function Navigator for Current Functional Status.   Therapy/Group: Individual Therapy  Garvey [place]cott  Julian Askin, PTA  06/02/2018, 1:20 PM

## 2018-06-02 NOTE — Progress Notes (Signed)
Occupational Therapy Session Note  Patient Details  Name: Erika Fry MRN: [id number] Date of Birth: 1946/04/26  Today's Date: 06/02/2018 OT Individual Time: 2641-5830 OT Individual Time Calculation (min): 30 min    Short Term Goals: Week 2:  OT Short Term Goal 1 (Week 2): Pt will move from sidelying to sit with mod A of 1 to prepare for bathing/dressing EOB or in wc. OT Short Term Goal 2 (Week 2): Pt will maintain static sitting EOB with min A for 5-10 min. OT Short Term Goal 3 (Week 2): Pt will don shirt with min A. OT Short Term Goal 4 (Week 2): Pt will sit to stand in bariatric Stedy with mod A of 1 to prepare for transfer to w/c.  Skilled Therapeutic Interventions/Progress Updates:    Treatment session with focus on activity tolerance and BUE strengthening.  Pt received supine in bed reporting excruciating pain in Rt hip and RLE spasms.  Pt declined OOB activity this session but willing to engage in BUE strengthening exercises.  Utilized 1# dumb bells in supine with pt able to complete 2 sets of 10 bicep curls, shoulder flexion overhead, and reaching up towards ceiling.  Pt required encouragement throughout.  Discussed relaxation/breathing strategies with pt to use to work through spasms.  Pt reports extreme fatigue and repositioned in bed to allow for rest before session with neuropsych.    Therapy Documentation Precautions:  Precautions Precautions: Fall Precaution Comments: Pacemaker placement 4 weeks ago Required Braces or Orthoses: Spinal Brace(LSO for comfort) Spinal Brace: Lumbar corset Restrictions Weight Bearing Restrictions: No General:   Vital Signs: Therapy Vitals Temp: 98 F (36.7 C) Temp Source: Oral Pulse Rate: 96 Resp: 17 BP: 130/63 Patient Position (if appropriate): Lying Oxygen Therapy SpO2: 92 % O2 Device: Room Air Pain: Pain Assessment Pain Score: 10-Worst pain ever Pain Type: Acute pain Pain Location: Hip Pain Orientation: Right Pain  Descriptors / Indicators: Aching;Discomfort;Grimacing Pain Intervention(s): Medication (See eMAR)  See Function Navigator for Current Functional Status.   Therapy/Group: Individual Therapy  Simonne Come 06/02/2018, 2:27 PM

## 2018-06-02 NOTE — Progress Notes (Signed)
Subjective/Complaints: Patient seen lying in bed this AM.  She states she woke up to have a bowel movement. She continues to complain about right hip pain.his morning. She states she slept better overnight. She notes shoe  ROS: +Right hip pain. Denies CP, S OB, nausea, vomiting diarrhea.  Objective: Vital Signs: Blood pressure (!) 152/76, pulse (!) 101, temperature 98.9 F (37.2 C), temperature source Oral, resp. rate 18, height _0  (1.651 m), weight 95.4 kg (210 lb 5.1 oz), SpO2 94 %. No results found. Results for orders placed or performed during the hospital encounter of 05/22/18 (from the past 72 hour(s))  Occult blood card to lab, stool RN will collect     Status: Abnormal   Collection Time: 05/30/18 10:04 AM  Result Value Ref Range   Fecal Occult Bld POSITIVE (A) NEGATIVE    Comment: Performed at [facility], [address] [place], [place] [address]  Glucose, capillary     Status: None   Collection Time: 05/30/18 12:09 PM  Result Value Ref Range   Glucose-Capillary 94 70 - 99 mg/dL  Glucose, capillary     Status: Abnormal   Collection Time: 05/30/18  4:49 PM  Result Value Ref Range   Glucose-Capillary 141 (H) 70 - 99 mg/dL  Culture, blood (routine x 2)     Status: None (Preliminary result)   Collection Time: 05/30/18  6:43 PM  Result Value Ref Range   Specimen Description BLOOD RIGHT ARM    Special Requests      BOTTLES DRAWN AEROBIC AND ANAEROBIC Blood Culture results may not be optimal due to an inadequate volume of blood received in culture bottles   Culture      NO GROWTH 2 DAYS Performed at [facility] [address], [place], [place] [address]    Report Status PENDING   Culture, blood (routine x 2)     Status: None (Preliminary result)   Collection Time: 05/30/18  6:53 PM  Result Value Ref Range   Specimen Description BLOOD RIGHT WRIST    Special Requests      BOTTLES DRAWN AEROBIC AND ANAEROBIC Blood Culture results may not be optimal due to  an inadequate volume of blood received in culture bottles   Culture      NO GROWTH 2 DAYS Performed at [facility], [place] [address], [place], [place] [address]    Report Status PENDING   Glucose, capillary     Status: Abnormal   Collection Time: 05/30/18  9:47 PM  Result Value Ref Range   Glucose-Capillary 146 (H) 70 - 99 mg/dL   Comment 1 Notify RN   Occult blood card to lab, stool RN will collect     Status: Abnormal   Collection Time: 05/31/18  1:02 AM  Result Value Ref Range   Fecal Occult Bld POSITIVE (A) NEGATIVE    Comment: Performed at [facility] [address], [place], [place] [address]  Basic metabolic panel     Status: Abnormal   Collection Time: 05/31/18  3:41 AM  Result Value Ref Range   Sodium 135 135 - 145 mmol/L   Potassium 3.1 (L) 3.5 - 5.1 mmol/L   Chloride 101 98 - 111 mmol/L    Comment: Please note change in reference range.   CO2 26 22 - 32 mmol/L   Glucose, Bld 119 (H) 70 - 99 mg/dL    Comment: Please note change in reference range.   BUN 34 (  H) 8 - 23 mg/dL    Comment: Please note change in reference range.   Creatinine, Ser 1.67 (H) 0.44 - 1.00 mg/dL   Calcium 8.7 (L) 8.9 - 10.3 mg/dL   GFR calc non Af Amer 29 (L) >60 mL/min   GFR calc Af Amer 34 (L) >60 mL/min    Comment: (NOTE) The eGFR has been calculated using the CKD EPI equation. This calculation has not been validated in all clinical situations. eGFR's persistently <60 mL/min signify possible Chronic Kidney Disease.    Anion gap 8 5 - 15    Comment: Performed at [facility] [address], [place], [place] [address]  CBC with Differential/Platelet     Status: Abnormal   Collection Time: 05/31/18  3:41 AM  Result Value Ref Range   WBC 11.1 (H) 4.0 - 10.5 K/uL   RBC 3.09 (L) 3.87 - 5.11 MIL/uL   Hemoglobin 9.2 (L) 12.0 - 15.0 g/dL   HCT 28.2 (L) 36.0 - 46.0 %   MCV 91.3 78.0 - 100.0 fL   MCH 29.8 26.0 - 34.0 pg   MCHC 32.6 30.0 - 36.0 g/dL   RDW 15.9 (H) 11.5 -  15.5 %   Platelets 153 150 - 400 K/uL   Neutrophils Relative % 87 %   Neutro Abs 9.7 (H) 1.7 - 7.7 K/uL   Lymphocytes Relative 5 %   Lymphs Abs 0.5 (L) 0.7 - 4.0 K/uL   Monocytes Relative 6 %   Monocytes Absolute 0.7 0.1 - 1.0 K/uL   Eosinophils Relative 1 %   Eosinophils Absolute 0.1 0.0 - 0.7 K/uL   Basophils Relative 0 %   Basophils Absolute 0.0 0.0 - 0.1 K/uL   Immature Granulocytes 1 %   Abs Immature Granulocytes 0.1 0.0 - 0.1 K/uL    Comment: Performed at [facility], [place] [address], [place], [place] [address]  Glucose, capillary     Status: Abnormal   Collection Time: 05/31/18  6:26 AM  Result Value Ref Range   Glucose-Capillary 114 (H) 70 - 99 mg/dL   Comment 1 Notify RN   Glucose, capillary     Status: Abnormal   Collection Time: 05/31/18 12:00 PM  Result Value Ref Range   Glucose-Capillary 149 (H) 70 - 99 mg/dL  Glucose, capillary     Status: Abnormal   Collection Time: 05/31/18  4:40 PM  Result Value Ref Range   Glucose-Capillary 166 (H) 70 - 99 mg/dL  Glucose, capillary     Status: Abnormal   Collection Time: 05/31/18  8:16 PM  Result Value Ref Range   Glucose-Capillary 153 (H) 70 - 99 mg/dL  Glucose, capillary     Status: Abnormal   Collection Time: 06/01/18  6:34 AM  Result Value Ref Range   Glucose-Capillary 102 (H) 70 - 99 mg/dL  Glucose, capillary     Status: Abnormal   Collection Time: 06/01/18 11:55 AM  Result Value Ref Range   Glucose-Capillary 141 (H) 70 - 99 mg/dL  Glucose, capillary     Status: Abnormal   Collection Time: 06/01/18  5:03 PM  Result Value Ref Range   Glucose-Capillary 145 (H) 70 - 99 mg/dL  Glucose, capillary     Status: Abnormal   Collection Time: 06/01/18  9:00 PM  Result Value Ref Range   Glucose-Capillary 108 (H) 70 - 99 mg/dL   Comment 1 Notify RN   Basic metabolic panel     Status: Abnormal   Collection  Time: 06/02/18  4:21 AM  Result Value Ref Range   Sodium 131 (L) 135 - 145 mmol/L   Potassium 3.5 3.5 - 5.1  mmol/L   Chloride 97 (L) 98 - 111 mmol/L    Comment: Please note change in reference range.   CO2 27 22 - 32 mmol/L   Glucose, Bld 93 70 - 99 mg/dL    Comment: Please note change in reference range.   BUN 26 (H) 8 - 23 mg/dL    Comment: Please note change in reference range.   Creatinine, Ser 1.46 (H) 0.44 - 1.00 mg/dL   Calcium 8.7 (L) 8.9 - 10.3 mg/dL   GFR calc non Af Amer 34 (L) >60 mL/min   GFR calc Af Amer 40 (L) >60 mL/min    Comment: (NOTE) The eGFR has been calculated using the CKD EPI equation. This calculation has not been validated in all clinical situations. eGFR's persistently <60 mL/min signify possible Chronic Kidney Disease.    Anion gap 7 5 - 15    Comment: Performed at [facility] [address] [place], [place] [address]  CBC with Differential/Platelet     Status: Abnormal   Collection Time: 06/02/18  4:21 AM  Result Value Ref Range   WBC 9.7 4.0 - 10.5 K/uL   RBC 3.22 (L) 3.87 - 5.11 MIL/uL   Hemoglobin 9.5 (L) 12.0 - 15.0 g/dL   HCT 29.2 (L) 36.0 - 46.0 %   MCV 90.7 78.0 - 100.0 fL   MCH 29.5 26.0 - 34.0 pg   MCHC 32.5 30.0 - 36.0 g/dL   RDW 15.4 11.5 - 15.5 %   Platelets 158 150 - 400 K/uL   Neutrophils Relative % 85 %   Neutro Abs 8.2 (H) 1.7 - 7.7 K/uL   Lymphocytes Relative 6 %   Lymphs Abs 0.6 (L) 0.7 - 4.0 K/uL   Monocytes Relative 6 %   Monocytes Absolute 0.6 0.1 - 1.0 K/uL   Eosinophils Relative 2 %   Eosinophils Absolute 0.2 0.0 - 0.7 K/uL   Basophils Relative 0 %   Basophils Absolute 0.0 0.0 - 0.1 K/uL   Immature Granulocytes 1 %   Abs Immature Granulocytes 0.1 0.0 - 0.1 K/uL    Comment: Performed at [facility], [place] [address] [place], [place] [address]  Glucose, capillary     Status: Abnormal   Collection Time: 06/02/18  6:17 AM  Result Value Ref Range   Glucose-Capillary 102 (H) 70 - 99 mg/dL     Constitutional: No distress . Vital signs reviewed. HENT: Normocephalic.  Atraumatic. Eyes: EOMI. No  discharge. Cardiovascular: RRR. No JVD. Respiratory: CTA Bilaterally. Normal effort. GI: BS +. Non-distended. Musc: Generalized edema, stable Neuro: Alert Motor:  Bilateral upper extremities: 4+/5 proximal distal Bilateral lower extremities: Hip flexion 1+/5, knee extension 3/5, ankle dorsiflexion 5/5 (left stronger than right, stable) Skin: Warm and dry. Intact. Psych: Normal mood.  Normal behavior.  Assessment/Plan: 1. Functional deficits secondary to L4-5 Lumbar discitis which require 3+ hours per day of interdisciplinary therapy in a comprehensive inpatient rehab setting. Physiatrist is providing close team supervision and 24 hour management of active medical problems listed below. Physiatrist and rehab team continue to assess barriers to discharge/monitor patient progress toward functional and medical goals. FIM: Function - Bathing Bathing activity did not occur: Refused Position: Bed Body parts bathed by patient: Right arm, Left arm, Chest, Abdomen Body parts bathed by helper: Right lower leg, Left lower leg,  Right upper leg, Left upper leg, Back, Front perineal area, Buttocks Assist Level: 2 helpers  Function- Upper Body Dressing/Undressing Upper body dressing/undressing activity did not occur: Refused What is the patient wearing?: Pull over shirt/dress Pull over shirt/dress - Perfomed by patient: Thread/unthread right sleeve, Thread/unthread left sleeve Pull over shirt/dress - Perfomed by helper: Put head through opening, Pull shirt over trunk Orthosis activity level: Performed by helper Assist Level: Touching or steadying assistance(Pt > 75%) Function - Lower Body Dressing/Undressing Lower body dressing/undressing activity did not occur: Refused What is the patient wearing?: Pants, [employer], Shoes Position: Bed Pants- Performed by helper: Thread/unthread right pants leg, Thread/unthread left pants leg, Pull pants up/down Non-skid slipper socks- Performed by helper:  Don/doff right sock, Don/doff left sock Shoes - Performed by helper: Don/doff right shoe, Don/doff left shoe TED Hose - Performed by helper: Don/doff right TED hose, Don/doff left TED hose Assist for lower body dressing: 2 Helpers  Function - Toileting Toileting activity did not occur: Safety/medical concerns Toileting steps completed by helper: Adjust clothing prior to toileting, Performs perineal hygiene, Adjust clothing after toileting Assist level: Two helpers  Function - Air cabin crew transfer activity did not occur: Safety/medical concerns Toilet transfer assistive device: Elevated toilet seat/BSC over toilet(STEDY +2) Assist level to toilet: Maximal assist (Pt 25 - 49%/lift and lower) Assist level from toilet: Maximal assist (Pt 25 - 49%/lift and lower)  Function - Chair/bed transfer Chair/bed transfer activity did not occur: Refused Chair/bed transfer method: Other Chair/bed transfer assist level: 2 helpers Chair/bed transfer assistive device: Facilities manager lift: Stedy  Function - Locomotion: Wheelchair Type: Manual Wheelchair activity did not occur: Refused Max wheelchair distance: 125 Assist Level: Touching or steadying assistance (Pt > 75%) Wheel 50 feet with 2 turns activity did not occur: Refused Assist Level: Touching or steadying assistance (Pt > 75%) Wheel 150 feet activity did not occur: Refused Function - Locomotion: Ambulation Ambulation activity did not occur: Safety/medical concerns Walk 10 feet activity did not occur: Safety/medical concerns Walk 50 feet with 2 turns activity did not occur: Safety/medical concerns Walk 150 feet activity did not occur: Safety/medical concerns Walk 10 feet on uneven surfaces activity did not occur: Safety/medical concerns  Function - Comprehension Comprehension: Auditory Comprehension assist level: Follows complex conversation/direction with extra time/assistive device  Function -  Expression Expression: Verbal Expression assist level: Expresses complex ideas: With extra time/assistive device  Function - Social Interaction Social Interaction assist level: Interacts appropriately 90% of the time - Needs monitoring or encouragement for participation or interaction.  Function - Problem Solving Problem solving assist level: Solves basic 50 - 74% of the time/requires cueing 25 - 49% of the time  Function - Memory Memory assist level: Recognizes or recalls 90% of the time/requires cueing < 10% of the time Patient normally able to recall (first 3 days only): That he or she is in a hospital, Staff names and faces, Current season  Medical Problem List and Plan:  1. Functional deficits secondary to L4-5 osteomyelitis/discitis, lumbar spondylosis   Continue CIR 2. DVT Prophylaxis/Anticoagulation: Mechanical: Sequential compression devices, below knee Bilateral lower extremities   Doppler negative for DVT in LE  LUE doppler ordered to evaluate for DVT, negative 3. Pain Management: Scheduled oxycodone prior to therapy to help with activity tolerance, decreased to 5 mg on 6/27.   Added ultram prn for moderate pain. Monitor and titrate medications to help with neuropathic symptoms. Decadron d/ced.   RLE radicular pain, partially improved on gabapentin to  661m BID, decreased to 300 mg twice a day, DC'd on 6/27 due to lethargy and tremors   Low dose dexamethasone DC'd on 6/26  Baclofen changed to 5 mg daily at bedtime, patient unable to tolerate higher doses, DC'd on 6/27 due to lethargy  Lidoderm patch added on 7/2 4. Mood: LCSW to follow for evaluation and support.  5. Neuropsych: This patient is capable of making decisions on her own behalf.  6. Skin/Wound Care: routine pressure relief measures  7. Fluids/Electrolytes/Nutrition: Monitor I/O.   8. Lumbar diskitis: Afebrile on scheduled tylenol. On Daptomycin and ceftriaxone. Steroids may make it more difficult to fight infx, pt  very uncomfortable and restarted low dose for limited time, DC'd on 6/26 9. Acute on chronic anemia: Treated with iron and procrit on outpatient basis. Baseline Hgb- 9-10 range? Will continue to monitor serially   Hemoglobin 9.5 on 7/2  Hemoccult positive, however hemoglobin stable  Continue to monitor 10. Symptomatic Bradycardia s/p PPM: Monitor HR bid.   Controlled at present 11. GIB with thrombocytopenia: Resolved  Cont to monitor 12. New diagnosis T2DM: Hgb A1c- 6.3. Poorly controlled due to recent rounds of steroids--Lantus was titrated upwards for better control. Was not on any medications at home. Monitor BS ac/hs--watch for hypoglycemic episodes as steroids tapered off.  CBG (last 3)  Recent Labs    06/01/18 1703 06/01/18 2100 06/02/18 0617  GLUCAP 145* 108* 102*   Slightly labile on 7/2 13. Neuropathy BLE: Continue Gabapentin bid--monitor for SE.  14. Hyponatremia: Resolved  Added protein supplements with meals. monitor for now.   Continue to monitor 15. Stage IV CKD: Baseline SCr 3.5-4.0. Monitor for SE/asterixis with narcotics.   Creatinine 1.46 on 7/2  Encourage fluids, IVF DC'd on 6/30  Continue to monitor 16. Fluid overload: Monitor daily weights and for signs of overload.   Lasix increased to 80 mg daily on 6/27 (PTCA 80 twice a day) Filed Weights   05/31/18 0551 06/01/18 0435 06/02/18 0500  Weight: 94.4 kg (208 lb 1.8 oz) 93.3 kg (205 lb 11 oz) 95.4 kg (210 lb 5.1 oz)   ?Reliability on 7/2 17. Morbid obesity  Encourage weight loss 18. Essential hypertension  Continue Norvasc 10 mg daily  Hydralazine 50 mg 3 times a day, increased to 75 on 6/24  Elevated on 7/2,  will consider further medications if persisten 19. Hypoalbuminemia  Supplement initiated 20. Hypokalemia  Potassium 3.5 on 6/30, supplement initiated  Continue to monitor 21. AMS: Improving  Likely medication induced  CXR reviewed, ECG reviewed, head CT reviewed, EEG performed - ? Borderline QTC  prolongation  Discussed with pharmacy  TSH, free T4 within acceptable range  Free T3 low  Continues to have low grade fever  UA unremarkable.  Urine culture no growth  Leukocytosis resolved with WBCs 9.7 on 7/2  Blood cultures no growth to date on 7/2 22. Hypothyroidism  Continue meds   See #21 23.  Sleep disturbance  Melatonin initiated on 6/30, increased on 7/1 24. Hyponatremia  Sodium 131 on 7/2  Continue to monitor  LOS (Days) 11 A FACE TO FACE EVALUATION WAS PERFORMED  Ankit ALorie Phenix7/01/2018, 9:15 AM

## 2018-06-03 ENCOUNTER — Inpatient Hospital Stay (HOSPITAL_COMMUNITY): Payer: Medicare Other

## 2018-06-03 ENCOUNTER — Inpatient Hospital Stay (HOSPITAL_COMMUNITY): Payer: Medicare Other | Admitting: Occupational Therapy

## 2018-06-03 ENCOUNTER — Inpatient Hospital Stay (HOSPITAL_COMMUNITY): Payer: Medicare Other | Admitting: Physical Therapy

## 2018-06-03 DIAGNOSIS — M4640 Discitis, unspecified, site unspecified: Secondary | ICD-10-CM

## 2018-06-03 LAB — GLUCOSE, CAPILLARY
GLUCOSE-CAPILLARY: 108 mg/dL — AB (ref 70–99)
GLUCOSE-CAPILLARY: 95 mg/dL (ref 70–99)
GLUCOSE-CAPILLARY: 110 mg/dL — AB (ref 70–99)
GLUCOSE-CAPILLARY: 119 mg/dL — AB (ref 70–99)

## 2018-06-03 MED ORDER — FUROSEMIDE 40 MG PO TABS
80.0000 mg | ORAL_TABLET | Freq: Every day | ORAL | Status: DC
  Administered 2018-06-04 – 2018-06-06 (×3): 80 mg via ORAL
  Filled 2018-06-03 (×5): qty 2

## 2018-06-03 MED ORDER — GABAPENTIN 100 MG PO CAPS
100.0000 mg | ORAL_CAPSULE | Freq: Three times a day (TID) | ORAL | Status: DC
  Administered 2018-06-03 – 2018-06-17 (×43): 100 mg via ORAL
  Filled 2018-06-03 (×43): qty 1

## 2018-06-03 NOTE — Plan of Care (Signed)
  Problem: RH BLADDER ELIMINATION Goal: RH STG MANAGE BLADDER WITH ASSISTANCE Description STG Manage Bladder With Assistance. Mod  Outcome: Not Progressing;incontinence at hs

## 2018-06-03 NOTE — Progress Notes (Signed)
Occupational Therapy Session Note  Patient Details  Name: Erika Fry MRN: [id number] Date of Birth: [birth year]  Today's Date: 06/03/2018 OT Individual Time: 6808-8110 and 1125-1155 OT Individual Time Calculation (min): 8 min and 30 min   Short Term Goals: Week 2:  OT Short Term Goal 1 (Week 2): Pt will move from sidelying to sit with mod A of 1 to prepare for bathing/dressing EOB or in wc. OT Short Term Goal 2 (Week 2): Pt will maintain static sitting EOB with min A for 5-10 min. OT Short Term Goal 3 (Week 2): Pt will don shirt with min A. OT Short Term Goal 4 (Week 2): Pt will sit to stand in bariatric Stedy with mod A of 1 to prepare for transfer to w/c.  Skilled Therapeutic Interventions/Progress Updates:    Session One: Pt seen for OT session focusing on education and pain management. Pt in supine upon arrival with RN and son present. Pt voicing increased pain in R hip and fatigue, reports having not slept well for several nights. Pt quick to refuse any offers of mobility and EOB/OOB activity.  Per RN report pt requested sleep medication this morning, desiring "to sleep through the day". Education provided regarding importance of establishing sleep/wake cycle, benefits of OOB and activity and increasing functional activity tolerance.  Discussed pain management, pt reports having had relief when heat pad applied in tx session earlier in the week. Pt provided with hydro heat pack to R hip for comfort. Will attempt to see again as able.    Session Two: Therapist returned later in morning. Pt initially declining mobility, requesting to complete supine there-ex, education provided regarding purpose of tx sessions and encouragement to complete supine there-ex outside of tx session and pt set-up with HEP, and no skilled services required. With lots of encouragement and education pt willing to attempt coming to EOB.  She required significantly increased time, and max-total A to come sitting  EOB. Chuck pad used to adjust hips to midline. Despite multi-modal cuing pt unable to weight shift to come into midline sitting due to heavy reliance on UEs. Pt tolerated partial OOB for ~5-7 minutes before requesting return to supine. Pt returned to supine, positioned for comfort with heat pack re-applied to R hip. Pt set-up with lunch tray, pt in charge of bed functions for increased independence/ autonomy.  Extensive education/ discussion regarding QD vs 15/7, therapy goals, building OOB tolerance, and d/c planning.  Therapy Documentation Precautions:  Precautions Precautions: Fall Precaution Comments: Pacemaker placement 4 weeks ago Required Braces or Orthoses: Spinal Brace(LSO for comfort) Spinal Brace: Lumbar corset Restrictions Weight Bearing Restrictions: No ADL: ADL ADL Comments: See functional navigator  See Function Navigator for Current Functional Status.   Therapy/Group: Individual Therapy  Maebel Marasco L 06/03/2018, 7:19 AM

## 2018-06-03 NOTE — Progress Notes (Addendum)
Subjective/Complaints: Pt seen lying in bed this AM.  She states she did not sleep well overnight.  She states she feels sick.  She is not sure if the patch helped.  She wants meds to help her sleep.  ROS: Denies CP, S OB, nausea, vomiting diarrhea.  Objective: Vital Signs: Blood pressure 128/64, pulse 98, temperature (!) 97.5 F (36.4 C), temperature source Oral, resp. rate 18, height 5' 5"  (1.651 m), weight 93.3 kg (205 lb 11 oz), SpO2 93 %. No results found. Results for orders placed or performed during the hospital encounter of 05/22/18 (from the past 72 hour(s))  Glucose, capillary     Status: Abnormal   Collection Time: 05/31/18 12:00 PM  Result Value Ref Range   Glucose-Capillary 149 (H) 70 - 99 mg/dL  Glucose, capillary     Status: Abnormal   Collection Time: 05/31/18  4:40 PM  Result Value Ref Range   Glucose-Capillary 166 (H) 70 - 99 mg/dL  Glucose, capillary     Status: Abnormal   Collection Time: 05/31/18  8:16 PM  Result Value Ref Range   Glucose-Capillary 153 (H) 70 - 99 mg/dL  Glucose, capillary     Status: Abnormal   Collection Time: 06/01/18  6:34 AM  Result Value Ref Range   Glucose-Capillary 102 (H) 70 - 99 mg/dL  Glucose, capillary     Status: Abnormal   Collection Time: 06/01/18 11:55 AM  Result Value Ref Range   Glucose-Capillary 141 (H) 70 - 99 mg/dL  Glucose, capillary     Status: Abnormal   Collection Time: 06/01/18  5:03 PM  Result Value Ref Range   Glucose-Capillary 145 (H) 70 - 99 mg/dL  Glucose, capillary     Status: Abnormal   Collection Time: 06/01/18  9:00 PM  Result Value Ref Range   Glucose-Capillary 108 (H) 70 - 99 mg/dL   Comment 1 Notify RN   Basic metabolic panel     Status: Abnormal   Collection Time: 06/02/18  4:21 AM  Result Value Ref Range   Sodium 131 (L) 135 - 145 mmol/L   Potassium 3.5 3.5 - 5.1 mmol/L   Chloride 97 (L) 98 - 111 mmol/L    Comment: Please note change in reference range.   CO2 27 22 - 32 mmol/L   Glucose,  Bld 93 70 - 99 mg/dL    Comment: Please note change in reference range.   BUN 26 (H) 8 - 23 mg/dL    Comment: Please note change in reference range.   Creatinine, Ser 1.46 (H) 0.44 - 1.00 mg/dL   Calcium 8.7 (L) 8.9 - 10.3 mg/dL   GFR calc non Af Amer 34 (L) >60 mL/min   GFR calc Af Amer 40 (L) >60 mL/min    Comment: (NOTE) The eGFR has been calculated using the CKD EPI equation. This calculation has not been validated in all clinical situations. eGFR's persistently <60 mL/min signify possible Chronic Kidney Disease.    Anion gap 7 5 - 15    Comment: Performed at [facility] [address] [place], [place] [address]  CBC with Differential/Platelet     Status: Abnormal   Collection Time: 06/02/18  4:21 AM  Result Value Ref Range   WBC 9.7 4.0 - 10.5 K/uL   RBC 3.22 (L) 3.87 - 5.11 MIL/uL   Hemoglobin 9.5 (L) 12.0 - 15.0 g/dL   HCT 29.2 (L) 36.0 - 46.0 %   MCV 90.7 78.0 - 100.0 fL  MCH 29.5 26.0 - 34.0 pg   MCHC 32.5 30.0 - 36.0 g/dL   RDW 15.4 11.5 - 15.5 %   Platelets 158 150 - 400 K/uL   Neutrophils Relative % 85 %   Neutro Abs 8.2 (H) 1.7 - 7.7 K/uL   Lymphocytes Relative 6 %   Lymphs Abs 0.6 (L) 0.7 - 4.0 K/uL   Monocytes Relative 6 %   Monocytes Absolute 0.6 0.1 - 1.0 K/uL   Eosinophils Relative 2 %   Eosinophils Absolute 0.2 0.0 - 0.7 K/uL   Basophils Relative 0 %   Basophils Absolute 0.0 0.0 - 0.1 K/uL   Immature Granulocytes 1 %   Abs Immature Granulocytes 0.1 0.0 - 0.1 K/uL    Comment: Performed at [facility] [address], [place], [place] [address]  Glucose, capillary     Status: Abnormal   Collection Time: 06/02/18  6:17 AM  Result Value Ref Range   Glucose-Capillary 102 (H) 70 - 99 mg/dL  Glucose, capillary     Status: Abnormal   Collection Time: 06/02/18 11:18 AM  Result Value Ref Range   Glucose-Capillary 105 (H) 70 - 99 mg/dL  Glucose, capillary     Status: Abnormal   Collection Time: 06/02/18  4:33 PM  Result Value Ref Range    Glucose-Capillary 115 (H) 70 - 99 mg/dL  Glucose, capillary     Status: Abnormal   Collection Time: 06/02/18  9:29 PM  Result Value Ref Range   Glucose-Capillary 101 (H) 70 - 99 mg/dL  Glucose, capillary     Status: Abnormal   Collection Time: 06/03/18  6:23 AM  Result Value Ref Range   Glucose-Capillary 108 (H) 70 - 99 mg/dL     Constitutional: No distress . Vital signs reviewed. HENT: Normocephalic.  Atraumatic. Eyes: EOMI. No discharge. Cardiovascular: RRR. No JVD. Respiratory: CTA Bilaterally. Normal effort. GI: BS +. Non-distended. Musc: Generalized edema, stable Neuro: Alert Motor:  Bilateral upper extremities: 4+/5 proximal distal Bilateral lower extremities: Hip flexion 1+/5, knee extension 3/5, ankle dorsiflexion 5/5 (left stronger than right, unchanged) Skin: Warm and dry. Intact. Psych: Normal mood.  Normal behavior.  Assessment/Plan: 1. Functional deficits secondary to L4-5 Lumbar discitis which require 3+ hours per day of interdisciplinary therapy in a comprehensive inpatient rehab setting. Physiatrist is providing close team supervision and 24 hour management of active medical problems listed below. Physiatrist and rehab team continue to assess barriers to discharge/monitor patient progress toward functional and medical goals. FIM: Function - Bathing Bathing activity did not occur: Refused Position: Bed Body parts bathed by patient: Right arm, Left arm, Chest, Abdomen Body parts bathed by helper: Right lower leg, Left lower leg, Right upper leg, Left upper leg, Back, Front perineal area, Buttocks Assist Level: 2 helpers  Function- Upper Body Dressing/Undressing Upper body dressing/undressing activity did not occur: Refused What is the patient wearing?: Pull over shirt/dress Pull over shirt/dress - Perfomed by patient: Thread/unthread right sleeve, Thread/unthread left sleeve Pull over shirt/dress - Perfomed by helper: Put head through opening, Pull shirt over  trunk Orthosis activity level: Performed by helper Assist Level: Touching or steadying assistance(Pt > 75%) Function - Lower Body Dressing/Undressing Lower body dressing/undressing activity did not occur: Refused What is the patient wearing?: Pants, [employer], Shoes Position: Bed Pants- Performed by helper: Thread/unthread right pants leg, Thread/unthread left pants leg, Pull pants up/down Non-skid slipper socks- Performed by helper: Don/doff right sock, Don/doff left sock Shoes - Performed by helper: Don/doff right shoe,  Don/doff left shoe TED Hose - Performed by helper: Don/doff right TED hose, Don/doff left TED hose Assist for lower body dressing: 2 Helpers  Function - Toileting Toileting activity did not occur: Safety/medical concerns Toileting steps completed by helper: Adjust clothing prior to toileting, Performs perineal hygiene, Adjust clothing after toileting Assist level: Two helpers  Function - Air cabin crew transfer activity did not occur: Safety/medical concerns Toilet transfer assistive device: Elevated toilet seat/BSC over toilet(STEDY +2) Assist level to toilet: Maximal assist (Pt 25 - 49%/lift and lower) Assist level from toilet: Maximal assist (Pt 25 - 49%/lift and lower)  Function - Chair/bed transfer Chair/bed transfer activity did not occur: Refused Chair/bed transfer method: Other Chair/bed transfer assist level: 2 helpers Chair/bed transfer assistive device: Facilities manager lift: Stedy  Function - Locomotion: Wheelchair Type: Manual Wheelchair activity did not occur: Refused Max wheelchair distance: 125 Assist Level: Touching or steadying assistance (Pt > 75%) Wheel 50 feet with 2 turns activity did not occur: Refused Assist Level: Touching or steadying assistance (Pt > 75%) Wheel 150 feet activity did not occur: Refused Function - Locomotion: Ambulation Ambulation activity did not occur: Safety/medical concerns Walk 10 feet activity  did not occur: Safety/medical concerns Walk 50 feet with 2 turns activity did not occur: Safety/medical concerns Walk 150 feet activity did not occur: Safety/medical concerns Walk 10 feet on uneven surfaces activity did not occur: Safety/medical concerns  Function - Comprehension Comprehension: Auditory Comprehension assist level: Follows complex conversation/direction with extra time/assistive device  Function - Expression Expression: Verbal Expression assist level: Expresses complex ideas: With extra time/assistive device  Function - Social Interaction Social Interaction assist level: Interacts appropriately 90% of the time - Needs monitoring or encouragement for participation or interaction.  Function - Problem Solving Problem solving assist level: Solves basic 50 - 74% of the time/requires cueing 25 - 49% of the time  Function - Memory Memory assist level: Recognizes or recalls 90% of the time/requires cueing < 10% of the time Patient normally able to recall (first 3 days only): That he or she is in a hospital, Staff names and faces, Current season  Medical Problem List and Plan:  1. Functional deficits secondary to L4-5 osteomyelitis/discitis, lumbar spondylosis   Continue CIR  Xray ordered 2. DVT Prophylaxis/Anticoagulation: Mechanical: Sequential compression devices, below knee Bilateral lower extremities   Doppler negative for DVT in LE  LUE doppler ordered to evaluate for DVT, negative 3. Pain Management: Scheduled oxycodone prior to therapy to help with activity tolerance, decreased to 5 mg on 6/27.   Added ultram prn for moderate pain. Monitor and titrate medications to help with neuropathic symptoms. Decadron d/ced.   RLE radicular pain, partially improved on gabapentin to 665m BID, decreased to 300 mg twice a day, DC'd on 6/27 due to lethargy and tremors. Gabapentin 100 3 times a day started on/3   Low dose dexamethasone DC'd on 6/26  Baclofen changed to 5 mg daily at  bedtime, patient unable to tolerate higher doses, DC'd on 6/27 due to lethargy  Lidoderm patch added on 7/2 4. Mood: LCSW to follow for evaluation and support.  5. Neuropsych: This patient is capable of making decisions on her own behalf.  6. Skin/Wound Care: routine pressure relief measures  7. Fluids/Electrolytes/Nutrition: Monitor I/O.   8. Lumbar diskitis: Afebrile on scheduled tylenol. On Daptomycin and ceftriaxone.  9. Acute on chronic anemia: Treated with iron and procrit on outpatient basis. Baseline Hgb- 9-10 range? Will continue to monitor serially  Hemoglobin 9.5 on 7/2  Hemoccult positive, however hemoglobin stable  Continue to monitor 10. Symptomatic Bradycardia s/p PPM: Monitor HR bid.   Controlled at present 11. GIB with thrombocytopenia: Resolved  Cont to monitor 12. New diagnosis T2DM: Hgb A1c- 6.3. Poorly controlled due to recent rounds of steroids--Lantus was titrated upwards for better control. Was not on any medications at home. Monitor BS ac/hs--watch for hypoglycemic episodes as steroids tapered off.  CBG (last 3)  Recent Labs    06/02/18 1633 06/02/18 2129 06/03/18 0623  GLUCAP 115* 101* 108*   Slightly labile on 7/2 13. Neuropathy BLE: Continue Gabapentin bid--monitor for SE.  14. Hyponatremia:   Sodium 131 on 7/2  Labs ordered for tomorrow  Added protein supplements with meals. monitor for now.   Continue to monitor 15. Stage IV CKD: Baseline SCr 3.5-4.0. Monitor for SE/asterixis with narcotics.   Creatinine 1.46 on 7/2  Encourage fluids, IVF DC'd on 6/30  Continue to monitor 16. Fluid overload: Monitor daily weights and for signs of overload.   Lasix increased to 80 mg daily on 6/27 (PTCA 80 twice a day) Filed Weights   06/01/18 0435 06/02/18 0500 06/03/18 0500  Weight: 93.3 kg (205 lb 11 oz) 95.4 kg (210 lb 5.1 oz) 93.3 kg (205 lb 11 oz)   Stable on 7/3 17. Morbid obesity  Encourage weight loss 18. Essential hypertension  Continue Norvasc 10 mg  daily  Hydralazine 50 mg 3 times a day, increased to 75 on 6/24  Controlled on 7/3 19. Hypoalbuminemia  Supplement initiated 20. Hypokalemia  Potassium 3.5 on 6/30, supplement initiated  Labs ordered for tomorrow  Continue to monitor 21. AMS: Improving  Likely medication induced  CXR reviewed, ECG reviewed, head CT reviewed, EEG performed - ? Borderline QTC prolongation  Discussed with pharmacy  TSH, free T4 within acceptable range  Free T3 low  Continues to have low grade fever  UA unremarkable.  Urine culture no growth  Leukocytosis resolved with WBCs 9.7 on 7/2  Blood cultures no growth to date on 7/3 22. Hypothyroidism  Continue meds   See #21 23.  Sleep disturbance  Melatonin initiated on 6/30, increased on 7/1  LOS (Days) 12 A FACE TO FACE EVALUATION WAS PERFORMED  Ankit [name] 06/03/2018, 9:43 AM

## 2018-06-03 NOTE — Patient Care Conference (Signed)
Inpatient RehabilitationTeam Conference and Plan of Care Update Date: 06/03/2018   Time: 2:45 PM    Patient Name: Erika Fry      Medical Record Number: [id number]  Date of Birth: 1946-01-19 Sex: Female         Room/Bed: 4M12C/4M12C-01 Payor Info: Payor: [employer] MEDICARE / Plan: [facility] MEDICARE / Product Type: *No Product type* /    Admitting Diagnosis: Debility Osteomyelins  Admit Date/Time:  05/22/2018  2:57 PM Admission Comments: No comment available   Primary Diagnosis:  Diskitis Principal Problem: Diskitis  Patient Active Problem List   Diagnosis Date Noted  . Hypertensive crisis   . Sleep disturbance   . Delirium   . SOB (shortness of breath)   . Somnolence   . Hyperkalemia   . Edema of hand   . Acute right-sided low back pain with right-sided sciatica   . Radicular pain   . Acute blood loss anemia   . Anemia of chronic disease   . Thrombocytopenia ([place])   . New onset type 2 diabetes mellitus ([place])   . Morbid obesity ([place])   . Hyponatremia   . Benign essential HTN   . Hypoalbuminemia due to protein-calorie malnutrition ([place])   . Diskitis 05/22/2018  . Occult blood in stools   . Gastric polyp   . Lumbar discitis 05/17/2018  . Blood loss anemia 05/12/2018  . Hyperlipidemia associated with type 2 diabetes mellitus ([place]) 05/10/2018  . Essential hypertension 04/29/2018  . CKD (chronic kidney disease), stage IV ([place])   . Diet-controlled diabetes mellitus ([place])   . GERD (gastroesophageal reflux disease)   . Gout   . High cholesterol   . Hypothyroidism   . Iron deficiency anemia   . AV block, Mobitz 2 03/25/2018  . Mobitz type 2 second degree AV block 03/20/2018  . Aortic stenosis 03/20/2018  . Exertional dyspnea 03/19/2018    Expected Discharge Date: Expected Discharge Date: 06/19/18  Team Members Present: Physician leading conference: Dr. Delice Lesch Social Worker Present: Lennart Pall, LCSW Nurse Present: Other (comment)(Angelina Vanessa Piperton, RN) PT  Present: Barrie Folk, PT OT Present: Clyda Greener, Jules Schick, OT SLP Present: Windell Moulding, SLP PPS Coordinator present : Daiva Nakayama, RN, CRRN     Current Status/Progress Goal Weekly Team Focus  Medical   Functional deficits secondary to L4-5 osteomyelitis/discitis, lumbar spondylosis  Improve mobility, pain, sleep, CBGs, electrolytes  See above   Bowel/Bladder   Continent of B/B female urinal and bedpan   remain continent of B/B mod assist with bedpan  assist with toileting q 2 h and prn laxative prn   Swallow/Nutrition/ Hydration             ADL's   Mod assist for UB dressing, total assist for LB dressing, total assist +2 for transfers out of the bed.  Limited participation throughout week secondary to pain and self limiting factors.   Min assist  activity tolerance, pain management, toilet transfers, balance,  DME/AE education   Mobility   maxA rolling L/R, maxA bed mobility, max to total assist sit to stand in [place] assist short distance gait  OOB tolerance, pain management, transfers, BLE strengthening   Communication             Safety/Cognition/ Behavioral Observations            Pain   Pain to left hip and right knee scheduled oxy 5 with breakfast and lunch. Oxy 5mg  Q6H prn, Ultam 50mg  Q6H prn tylenol  prn  Pain will be <=3/10  medicate as directed and prn   Skin   bruises to abdomen puncture to Left lower back with film dressing, groin pink,   skin free of breakdown or infection.   assess skin q shift and prn    Rehab Goals Patient on target to meet rehab goals: No Rehab Goals Revised: Concern with limited progress this week *See Care Plan and progress notes for long and short-term goals.     Barriers to Discharge  Current Status/Progress Possible Resolutions Date Resolved   Physician    Medical stability;Weight;New diabetic;Other (comments)  Lethargy  See above  Therapies, follow labs, optimize pain meds, optimize DM/HTN meds, optimize sleep       Nursing                  PT                    OT                  SLP                SW                Discharge Planning/Teaching Needs:  Pt to d/c home with daughter-in-law, Erika Fry, to be primary caregiver.  [employer] to cover 24/7 assistance.  Teaching to be planned soon.   Team Discussion:  Pain and poor sleep continue to be a major factor/ concerns;  Some incont at times.  Currently total assist with LB b/d and mod assist UB.  Cant take +2 for OOB.  She seems to do a little better if you distract her from task at hand.  Did refuse some therapies yesterday due to spasms.  Expect may need to downgrade goals but will monitor progress this week.    Revisions to Treatment Plan:  Change schedule to 15/7    Continued Need for Acute Rehabilitation Level of Care: The patient requires daily medical management by a physician with specialized training in physical medicine and rehabilitation for the following conditions: Daily direction of a multidisciplinary physical rehabilitation program to ensure safe treatment while eliciting the highest outcome that is of practical value to the patient.: Yes Daily medical management of patient stability for increased activity during participation in an intensive rehabilitation regime.: Yes Daily analysis of laboratory values and/or radiology reports with any subsequent need for medication adjustment of medical intervention for : Neurological problems;Diabetes problems;Blood pressure problems;Renal problems;Other;Mood/behavior problems  Erika Fry 06/03/2018, 4:28 PM

## 2018-06-03 NOTE — Plan of Care (Signed)
  Problem: RH PAIN MANAGEMENT Goal: RH STG PAIN MANAGED AT OR BELOW PT'S PAIN GOAL Description Less than 4  06/03/2018 1621 by Ander Slade, RN Outcome: Not Progressing; pain back and rt hip; MD aware new orders noted

## 2018-06-03 NOTE — Consult Note (Signed)
Neuropsychological Consultation   Patient:   Erika Fry   DOB:   Jun 18, 1946  MR Number:  [id number]  Location:  [place] [address] [facility] [address] [id number] [place] [address] Dept: [place]: [phone]           Date of Service:   06/02/2018  Start Time:   3 PM End Time:   4 PM  Provider/Observer:  Ilean Skill, Psy.D.       Clinical Neuropsychologist       Billing Code/Service: [phone] 4 Units  Chief Complaint:    Erika Fry is a 72 year old female with a history of hypertension, chronic kidney disease, anemia of chronic disease, type 2 diabetes, PPM 4/19, recent admission for fall due to UTI with back pain.  The patient was unable to have an MRI due to recent PPM.  The patient was treated with steroids and she was discharged to SNF on 05/05/2018.  The patient was readmitted on 05/12/2018 with constipation, hypertension and acute on chronic anemia.  MRI of the spine revealed acute osteomyelitis of L4/5 with Paraspinous edema within the right psoas muscle, paraspinous fluid collection as well as moderate severe right and moderate left foraminal  stenosis.  The patient underwent aspiration of fluid collection from her back and cultures were negative.  Infectious disease was consulted for input and recommended at least 6 weeks of IV antibiotics.  The patient also had a significant episode of delirium that developed during the comprehensive rehab program.  The patient was on a number of medications as well as having prior repeat UTIs.  Reason for Service:  Erika Fry was referred for neuropsychological consultation due to concerns of her cognitive functioning and issues related to her acute delirium.  Below is the HPI for the current admission.  HPI: Erika Fry is a 72 year old female with history of HTN, CKD, anemia of chronic disease, T2DM, PPM 4/19, recent admission for fall due to  UTI with back pain (no MRI due to recent PPM) treated with steroids and she was discharged to SNF on 05/05/18. She was readmitted on 05/12/18 with constipation, hypotension and acute on chronic anemia with Hgb 6.9. She was treated with fluid boluses and started on PPI. MRI of spine done revealing acute osteomyelitis of L4/5 with paraspinous edema within the right psoas muscle, paraspinous fluid collection as well as moderate to severe right and moderate left foraminal stenosis. GI was consulted for input and recommended supportive care to the medical work-up is complete. She underwent aspiration of fluid collection from her back and cultures are negative. ID was consulted for input and recommended at least 6 weeks of IV ceftriaxone and renally dosed daptomycin--end date July 24th.  She has been transfused with 6 units packed red blood cells during her stay. She was treated with decadron for pain control and this was d/c today. Renal status has improved off furosemide. She has had episodes of heme positive stools and underwent EGD to revealing single friable gastric polyp pylorus polyp that was biopsied by Dr. Fuller Plan. She is to follow-up on outpatient basis for colonoscopy and follow-up EGD. Therapy ongoing and patient continues to be limited by pain neuropathy bilateral lower extremity as well as fatigue. CIR was requested due to functional decline   Current Status:  During the 1 hour face-to-face clinical interview with the patient, the patient appeared to have made significant recovery and improvement in her cognitive function  and mental status.  The patient was well oriented and was showing good memory for both past and most recent events.  The patient did have anterior grade amnesia for episodes during her delirium.  There have been significant medication changes and the patient is also continued on her antibiotic treatments.  The patient reports that she is doing much better and her thinking is very clear.   The patient had very good performance on the RBANS neuropsychological test battery with good performance with regard to expressive and receptive language functioning, short-term and intermediate memory functions, orientation, and visual-spatial abilities.  The patient showed good verbal fluency and focus execute abilities.  Behavioral Observation: Erika Fry  presents as a 72 y.o.-year-old Right Caucasian Female who appeared her stated age. her dress was Appropriate and she was Well Groomed and her manners were Appropriate to the situation.  her participation was indicative of Appropriate and Attentive behaviors.  There were any physical disabilities noted.  she displayed an appropriate level of cooperation and motivation.     Interactions:    Active Appropriate and Attentive  Attention:   within normal limits and attention span and concentration were age appropriate  Memory:   within normal limits; recent and remote memory intact  Visuo-spatial:  within normal limits  Speech (Volume):  normal  Speech:   normal; normal  Thought Process:  Coherent and Relevant  Though Content:  WNL; not suicidal and not homicidal  Orientation:   person, place, time/date and situation  Judgment:   Good  Planning:   Good  Affect:    Appropriate  Mood:    Euthymic  Insight:   Good  Intelligence:   normal  Medical History:   Past Medical History:  Diagnosis Date  . Arthritis    "knees, thumbs" (03/25/2018)  . CKD (chronic kidney disease), stage IV ([place])   . Diet-controlled diabetes mellitus ([place])   . GERD (gastroesophageal reflux disease)   . Gout    "on daily RX" (03/25/2018)  . Heart murmur   . High cholesterol   . Hypertension   . Hypothyroidism   . Iron deficiency anemia    "had to get an iron infusion"  . Migraine    "used to have them growing up" (03/25/2018)  . Presence of permanent cardiac pacemaker 03/25/2018   Psychiatric History:  No prior psychiatric history  noted  Family Med/Psych History:  Family History  Problem Relation Age of Onset  . Hypertension Mother   . Diabetes Mellitus II Father   . Heart disease Father   . Gastric cancer Brother   . Diabetes Mellitus II Brother     Risk of Suicide/Violence: virtually non-existent the patient denies any suicidal or homicidal ideation.  Impression/DX:  Erika Fry is a 72 year old female with a history of hypertension, chronic kidney disease, anemia of chronic disease, type 2 diabetes, PPM 4/19, recent admission for fall due to UTI with back pain.  The patient was unable to have an MRI due to recent PPM.  The patient was treated with steroids and she was discharged to SNF on 05/05/2018.  The patient was readmitted on 05/12/2018 with constipation, hypertension and acute on chronic anemia.  MRI of the spine revealed acute osteomyelitis of L4/5 with Paraspinous edema within the right psoas muscle, paraspinous fluid collection as well as moderate severe right and moderate left foraminal  stenosis.  The patient underwent aspiration of fluid collection from her back and cultures were negative.  Infectious disease was  consulted for input and recommended at least 6 weeks of IV antibiotics.  The patient also had a significant episode of delirium that developed during the comprehensive rehab program.  The patient was on a number of medications as well as having prior repeat UTIs.  During the 1 hour face-to-face clinical interview with the patient, the patient appeared to have made significant recovery and improvement in her cognitive function and mental status.  The patient was well oriented and was showing good memory for both past and most recent events.  The patient did have anterior grade amnesia for episodes during her delirium.  There have been significant medication changes and the patient is also continued on her antibiotic treatments.  The patient reports that she is doing much better and her thinking is  very clear.  The patient had very good performance on the RBANS neuropsychological test battery with good performance with regard to expressive and receptive language functioning, short-term and intermediate memory functions, orientation, and visual-spatial abilities.  The patient showed good verbal fluency and focus execute abilities.   Diagnosis:    Resolved issues with delirium likely due to a combination of medications, chronic kidney disease, and significant prior UTIs.        Electronically Signed   _______________________ Ilean Skill, Psy.D.

## 2018-06-03 NOTE — Progress Notes (Addendum)
Physical Therapy Session Note  Patient Details  Name: Erika Fry MRN: [id number] Date of Birth: 12/02/1946  Today's Date: 06/03/2018 PT Individual Time: 2575-0518 and 3358-2518 PT Individual Time Calculation (min): 27 min and 40 min (total 67 min)   Short Term Goals: Week 2:  PT Short Term Goal 1 (Week 2): Pt will tolerate 1 hour of sitting up in chair w/ minimal increase in pain or fatigue PT Short Term Goal 2 (Week 2): Pt will transfer bed<>chair w/ max assist x1 PT Short Term Goal 3 (Week 2): Pt will perform sit<>stand w/ LRAD, max assist x1  Skilled Therapeutic Interventions/Progress Updates: Tx 1: Pt received supine in bed on bedpan; reports finished voiding, c/o 10/10 pain in back and very sensitive to movement. Rolling R/L to remove bedpan and don brief with maxA, increased time d/t pain and fear of movement. Supine>sit with logroll maxA; unable to reach midline sitting posture, with pt returning to L elbow almost immediately once sitting, and actively resisting assistance to reach midline. Side propped on elbow x3-4 min before requesting to return to bed d/t urge to void again. Rolling R/L as above to place bedpan. Provided encouragement throughout session to improve participation. Remained in bed at end of session, all needs in reach.   tx 2: Pt received in bed, pain as above and agreeable to treatment. Rn present to administer pain medication. Supine>sit maxA with logroll technique. Sit <>stand with stedy and maxA +2. Sit <>stand x3 from stedy seat with mod/max +1. Cues for anterior weight shift and glute activation. Returned to bed totalA following transport arrival for lumbar xray. Remained supine in bed, all needs in reach.     Therapy Documentation Precautions:  Precautions Precautions: Fall Precaution Comments: Pacemaker placement 4 weeks ago Required Braces or Orthoses: Spinal Brace(LSO for comfort) Spinal Brace: Lumbar corset Restrictions Weight Bearing Restrictions:  No   See Function Navigator for Current Functional Status.   Therapy/Group: Individual Therapy  Luberta Mutter 06/03/2018, 2:05 PM

## 2018-06-04 ENCOUNTER — Inpatient Hospital Stay (HOSPITAL_COMMUNITY): Payer: Medicare Other | Admitting: Physical Therapy

## 2018-06-04 ENCOUNTER — Inpatient Hospital Stay (HOSPITAL_COMMUNITY): Payer: Medicare Other | Admitting: Occupational Therapy

## 2018-06-04 DIAGNOSIS — E876 Hypokalemia: Secondary | ICD-10-CM

## 2018-06-04 LAB — CULTURE, BLOOD (ROUTINE X 2)
CULTURE: NO GROWTH
Culture: NO GROWTH

## 2018-06-04 LAB — BASIC METABOLIC PANEL
Anion gap: 10 (ref 5–15)
BUN: 28 mg/dL — ABNORMAL HIGH (ref 8–23)
CO2: 29 mmol/L (ref 22–32)
CREATININE: 1.73 mg/dL — AB (ref 0.44–1.00)
Calcium: 9.1 mg/dL (ref 8.9–10.3)
Chloride: 96 mmol/L — ABNORMAL LOW (ref 98–111)
GFR calc Af Amer: 32 mL/min — ABNORMAL LOW (ref >60)
GFR calc non Af Amer: 28 mL/min — ABNORMAL LOW (ref >60)
GLUCOSE: 98 mg/dL (ref 70–99)
Potassium: 3.4 mmol/L — ABNORMAL LOW (ref 3.5–5.1)
Sodium: 135 mmol/L (ref 135–145)

## 2018-06-04 LAB — GLUCOSE, CAPILLARY
GLUCOSE-CAPILLARY: 134 mg/dL — AB (ref 70–99)
Glucose-Capillary: 82 mg/dL (ref 70–99)
Glucose-Capillary: 157 mg/dL — ABNORMAL HIGH (ref 70–99)
Glucose-Capillary: 143 mg/dL — ABNORMAL HIGH (ref 70–99)

## 2018-06-04 LAB — CK: Total CK: 150 U/L (ref 38–234)

## 2018-06-04 MED ORDER — POTASSIUM CHLORIDE CRYS ER 20 MEQ PO TBCR
20.0000 meq | EXTENDED_RELEASE_TABLET | Freq: Two times a day (BID) | ORAL | Status: DC
  Administered 2018-06-04 – 2018-06-07 (×6): 20 meq via ORAL
  Filled 2018-06-04 (×6): qty 1

## 2018-06-04 NOTE — Progress Notes (Signed)
Social Work Patient ID: Erika Fry, female   DOB: Jan 02, 1946, 72 y.o.   MRN: [id number]  Have reviewed team conference with pt and dtr-in-law, Amber, who is to be the primary caregiver.  Both aware that we continue to target 7/19 d/c date and that she will need 24/7 physical assistance.  Discussed with pt that she will likely need a ramp at home as well.  Amber aware that we need to have her begin to observe/ participate in therapies next week.  She plans to be here at 1:00 on Tuesday so will alert the therapies.  I will meet with them after sessions conclude to discuss any concerns/ questions they may be having.  [name], LCSW

## 2018-06-04 NOTE — Progress Notes (Signed)
[place] for Daptomycin Indication: Lumbar discitis/osteo    Allergies  Allergen Reactions  . Codeine Other (See Comments)    Increases Pain and couldn't sleep    Patient Measurements: Height: 5\' 5"  (165.1 cm) Weight: 206 lb (93.4 kg) IBW/kg (Calculated) : 57  Vital Signs: Temp: 98.1 F (36.7 C) (07/04 0459) Temp Source: Oral (07/04 0459) BP: 130/64 (07/04 0459) Pulse Rate: 92 (07/04 0459) Intake/Output from previous day: 07/03 0701 - 07/04 0700 In: 480 [P.O.:480] Out: 1575 [Urine:1575] Intake/Output from this shift: Total I/O In: 240 [P.O.:240] Out: 400 [Urine:400]  Labs: Recent Labs    06/02/18 0421 06/04/18 0418  WBC 9.7  --   HGB 9.5*  --   PLT 158  --   CREATININE 1.46* 1.73*   Estimated Creatinine Clearance: 32.2 mL/min (A) (by C-G formula based on SCr of 1.73 mg/dL (H)). No results for input(s): VANCOTROUGH, VANCOPEAK, VANCORANDOM, GENTTROUGH, GENTPEAK, GENTRANDOM, TOBRATROUGH, TOBRAPEAK, TOBRARND, AMIKACINPEAK, AMIKACINTROU, AMIKACIN in the last 72 hours.    Assessment: ID: Osteomyelitis / lumbar discitis. Afebrile. WBC WNL. Scr is elevated, but stable for patient. 6/14: image-guided lumbar 4-lumbar 5 disc aspiration. PICC 6/17 - For 6 weeks of therapy.   Zosyn 6/12>>6/16 CTX 6/16 >> (7/24) Vanco 6/12 >> 6/18 Dapto 6/18 >(7/24)  5/29 CK 81 6/21 CK 139 6/27 CK 132 7/4 CK 150  6/14 Fungus Cx > IP 6/14 Wound/Lumbar Aspirate Cx > neg 6/15 Blood Cx > neg 6/29 BCx - NGTD 6/28 UCx - negative  6/12 MRI: Findings concerning for acute osteomyelitis discitis involving the right aspect of the L4-5 interspace as above.   Plan:  -Continue Daptomycin 780mg  IV Q24H (8mg /kg) -CTX 2g q24h per MD -CK q Thursday on [place], Pharm.D. PGY2 Pharmacy Resident 06/04/2018 8:38 AM Please check AMION for all [place] numbers

## 2018-06-04 NOTE — Progress Notes (Signed)
Occupational Therapy Session Note  Patient Details  Name: Erika Fry MRN: [id number] Date of Birth: 09-04-1946  Today's Date: 06/04/2018 OT Individual Time: 8921-1941 OT Individual Time Calculation (min): 29 min    Short Term Goals: Week 2:  OT Short Term Goal 1 (Week 2): Pt will move from sidelying to sit with mod A of 1 to prepare for bathing/dressing EOB or in wc. OT Short Term Goal 2 (Week 2): Pt will maintain static sitting EOB with min A for 5-10 min. OT Short Term Goal 3 (Week 2): Pt will don shirt with min A. OT Short Term Goal 4 (Week 2): Pt will sit to stand in bariatric Stedy with mod A of 1 to prepare for transfer to w/c.  Skilled Therapeutic Interventions/Progress Updates:    Pt up in tilt in space chair to start session, reclined.  Therapist attempted to raise pt upright more to start but she was unable to tolerate for any period of time.  Repositioned her back in recliner position and worked on BUE shoulder flexion.  She needed min assist to complete 2 sets of 10 reps with the LUE and supervision for 2 sets of 10 reps on the right.  Decreased ability to shift weight over to the right hip secondary to pain to allow for her to complete AROM in the LUE without assistance.  Michaelyn Barter with total assist +2 for standing (pt 30%).  Once standing she maintained it with min assist for 30 seconds before needing to sit down on the flaps of the Stedy.  Transferred her over to the bed where she needed the same amount of assist for sit to stand X2 in order to lift flaps and sit on the bed.  Total +2 (pt 30%) for supine to sit with one therapist controlling the trunk and the other controlling her LEs to lift them into the bed.  Pt left with bed control to work on elevating her head a little at a time as she could tolerate.  Phone in reach as well.    Therapy Documentation Precautions:  Precautions Precautions: Fall Precaution Comments: Pacemaker placement 4 weeks ago Required Braces  or Orthoses: Spinal Brace Spinal Brace: Lumbar corset Restrictions Weight Bearing Restrictions: No   Pain: Pain Assessment Pain Scale: Faces Pain Score: 5  Faces Pain Scale: Hurts even more Pain Type: Chronic pain Pain Location: Back Pain Orientation: Right Pain Descriptors / Indicators: Jabbing Pain Frequency: Intermittent Pain Onset: With Activity Patients Stated Pain Goal: 0 Pain Intervention(s): Repositioned ADL: See Function Navigator for Current Functional Status.   Therapy/Group: Individual Therapy  Ludmilla Mcgillis OTR/L 06/04/2018, 11:53 AM

## 2018-06-04 NOTE — Plan of Care (Signed)
  Problem: RH SKIN INTEGRITY Goal: RH STG MAINTAIN SKIN INTEGRITY WITH ASSISTANCE Description STG Maintain Skin Integrity With Assistance. mod  Outcome: Progressing

## 2018-06-04 NOTE — Progress Notes (Signed)
Subjective/Complaints: Patient seen lying in bed this morning. She states she slept better overnight. She states she feels much better this morning.  ROS: denies CP, S OB, nausea, vomiting diarrhea.  Objective: Vital Signs: Blood pressure 130/64, pulse 92, temperature 98.1 F (36.7 C), temperature source Oral, resp. rate 18, height 5' 5"  (1.651 m), weight 93.4 kg (206 lb), SpO2 95 %. Dg Lumbar Spine Complete  Result Date: 06/03/2018 CLINICAL DATA:  Low back pain EXAM: LUMBAR SPINE - COMPLETE 4+ VIEW COMPARISON:  05/13/2018 FINDINGS: Five lumbar type vertebral bodies are well visualized. Vertebral body height is well maintained. There is indistinctness of the inferior endplate at L4 and superior endplate at L5 consistent with the known history of underlying discitis and osteomyelitis. The overall appearance is similar to that seen on recent MRI examination. No compression deformity is seen. Aortic calcifications are noted. IMPRESSION: Changes consistent with the given clinical history of osteomyelitis and discitis at L4-5. Electronically Signed   By: Inez Catalina M.D.   On: 06/03/2018 16:22   Results for orders placed or performed during the hospital encounter of 05/22/18 (from the past 72 hour(s))  Glucose, capillary     Status: Abnormal   Collection Time: 06/01/18 11:55 AM  Result Value Ref Range   Glucose-Capillary 141 (H) 70 - 99 mg/dL  Glucose, capillary     Status: Abnormal   Collection Time: 06/01/18  5:03 PM  Result Value Ref Range   Glucose-Capillary 145 (H) 70 - 99 mg/dL  Glucose, capillary     Status: Abnormal   Collection Time: 06/01/18  9:00 PM  Result Value Ref Range   Glucose-Capillary 108 (H) 70 - 99 mg/dL   Comment 1 Notify RN   Basic metabolic panel     Status: Abnormal   Collection Time: 06/02/18  4:21 AM  Result Value Ref Range   Sodium 131 (L) 135 - 145 mmol/L   Potassium 3.5 3.5 - 5.1 mmol/L   Chloride 97 (L) 98 - 111 mmol/L    Comment: Please note change in  reference range.   CO2 27 22 - 32 mmol/L   Glucose, Bld 93 70 - 99 mg/dL    Comment: Please note change in reference range.   BUN 26 (H) 8 - 23 mg/dL    Comment: Please note change in reference range.   Creatinine, Ser 1.46 (H) 0.44 - 1.00 mg/dL   Calcium 8.7 (L) 8.9 - 10.3 mg/dL   GFR calc non Af Amer 34 (L) >60 mL/min   GFR calc Af Amer 40 (L) >60 mL/min    Comment: (NOTE) The eGFR has been calculated using the CKD EPI equation. This calculation has not been validated in all clinical situations. eGFR's persistently <60 mL/min signify possible Chronic Kidney Disease.    Anion gap 7 5 - 15    Comment: Performed at [facility] [address] [place], [place] [address]  CBC with Differential/Platelet     Status: Abnormal   Collection Time: 06/02/18  4:21 AM  Result Value Ref Range   WBC 9.7 4.0 - 10.5 K/uL   RBC 3.22 (L) 3.87 - 5.11 MIL/uL   Hemoglobin 9.5 (L) 12.0 - 15.0 g/dL   HCT 29.2 (L) 36.0 - 46.0 %   MCV 90.7 78.0 - 100.0 fL   MCH 29.5 26.0 - 34.0 pg   MCHC 32.5 30.0 - 36.0 g/dL   RDW 15.4 11.5 - 15.5 %   Platelets 158 150 - 400 K/uL  Neutrophils Relative % 85 %   Neutro Abs 8.2 (H) 1.7 - 7.7 K/uL   Lymphocytes Relative 6 %   Lymphs Abs 0.6 (L) 0.7 - 4.0 K/uL   Monocytes Relative 6 %   Monocytes Absolute 0.6 0.1 - 1.0 K/uL   Eosinophils Relative 2 %   Eosinophils Absolute 0.2 0.0 - 0.7 K/uL   Basophils Relative 0 %   Basophils Absolute 0.0 0.0 - 0.1 K/uL   Immature Granulocytes 1 %   Abs Immature Granulocytes 0.1 0.0 - 0.1 K/uL    Comment: Performed at [facility] [address] [place], [place] [address]  Glucose, capillary     Status: Abnormal   Collection Time: 06/02/18  6:17 AM  Result Value Ref Range   Glucose-Capillary 102 (H) 70 - 99 mg/dL  Glucose, capillary     Status: Abnormal   Collection Time: 06/02/18 11:18 AM  Result Value Ref Range   Glucose-Capillary 105 (H) 70 - 99 mg/dL  Glucose, capillary     Status: Abnormal   Collection  Time: 06/02/18  4:33 PM  Result Value Ref Range   Glucose-Capillary 115 (H) 70 - 99 mg/dL  Glucose, capillary     Status: Abnormal   Collection Time: 06/02/18  9:29 PM  Result Value Ref Range   Glucose-Capillary 101 (H) 70 - 99 mg/dL  Glucose, capillary     Status: Abnormal   Collection Time: 06/03/18  6:23 AM  Result Value Ref Range   Glucose-Capillary 108 (H) 70 - 99 mg/dL  Glucose, capillary     Status: None   Collection Time: 06/03/18 11:46 AM  Result Value Ref Range   Glucose-Capillary 95 70 - 99 mg/dL  Glucose, capillary     Status: Abnormal   Collection Time: 06/03/18  5:08 PM  Result Value Ref Range   Glucose-Capillary 110 (H) 70 - 99 mg/dL  Glucose, capillary     Status: Abnormal   Collection Time: 06/03/18  9:42 PM  Result Value Ref Range   Glucose-Capillary 119 (H) 70 - 99 mg/dL  Basic metabolic panel     Status: Abnormal   Collection Time: 06/04/18  4:18 AM  Result Value Ref Range   Sodium 135 135 - 145 mmol/L   Potassium 3.4 (L) 3.5 - 5.1 mmol/L   Chloride 96 (L) 98 - 111 mmol/L    Comment: Please note change in reference range.   CO2 29 22 - 32 mmol/L   Glucose, Bld 98 70 - 99 mg/dL    Comment: Please note change in reference range.   BUN 28 (H) 8 - 23 mg/dL    Comment: Please note change in reference range.   Creatinine, Ser 1.73 (H) 0.44 - 1.00 mg/dL   Calcium 9.1 8.9 - 10.3 mg/dL   GFR calc non Af Amer 28 (L) >60 mL/min   GFR calc Af Amer 32 (L) >60 mL/min    Comment: (NOTE) The eGFR has been calculated using the CKD EPI equation. This calculation has not been validated in all clinical situations. eGFR's persistently <60 mL/min signify possible Chronic Kidney Disease.    Anion gap 10 5 - 15    Comment: Performed at [facility] [address], [place], [place] [address]  CK     Status: None   Collection Time: 06/04/18  4:18 AM  Result Value Ref Range   Total CK 150 38 - 234 U/L    Comment: Performed at [facility], [address] [place], [place] [address]  Glucose, capillary     Status: None   Collection Time: 06/04/18  6:34 AM  Result Value Ref Range   Glucose-Capillary 82 70 - 99 mg/dL     Constitutional: No distress . Vital signs reviewed. HENT: Normocephalic.  Atraumatic. Eyes: EOMI. No discharge. Cardiovascular: RRR. No JVD. Respiratory: CTA bilaterally. Normal effort. GI: BS +. Non-distended. Musc: Generalized edema, stable Neuro: Alert Motor:  Bilateral upper extremities: 4+/5 proximal distal Bilateral lower extremities: Hip flexion 1+/5, knee extension 3/5, ankle dorsiflexion 5/5 (left stronger than right, stable) Skin: Warm and dry. Intact. Psych: Normal mood.  Normal behavior.  Assessment/Plan: 1. Functional deficits secondary to L4-5 Lumbar discitis which require 3+ hours per day of interdisciplinary therapy in a comprehensive inpatient rehab setting. Physiatrist is providing close team supervision and 24 hour management of active medical problems listed below. Physiatrist and rehab team continue to assess barriers to discharge/monitor patient progress toward functional and medical goals. FIM: Function - Bathing Bathing activity did not occur: Refused Position: Bed Body parts bathed by patient: Right arm, Left arm, Chest, Abdomen Body parts bathed by helper: Right lower leg, Left lower leg, Right upper leg, Left upper leg, Back, Front perineal area, Buttocks Assist Level: 2 helpers  Function- Upper Body Dressing/Undressing Upper body dressing/undressing activity did not occur: Refused What is the patient wearing?: Pull over shirt/dress Pull over shirt/dress - Perfomed by patient: Thread/unthread right sleeve, Thread/unthread left sleeve Pull over shirt/dress - Perfomed by helper: Put head through opening, Pull shirt over trunk Orthosis activity level: Performed by helper Assist Level: Touching or steadying assistance(Pt > 75%) Function - Lower Body Dressing/Undressing Lower body  dressing/undressing activity did not occur: Refused What is the patient wearing?: Pants, [employer], Shoes Position: Bed Pants- Performed by helper: Thread/unthread right pants leg, Thread/unthread left pants leg, Pull pants up/down Non-skid slipper socks- Performed by helper: Don/doff right sock, Don/doff left sock Shoes - Performed by helper: Don/doff right shoe, Don/doff left shoe TED Hose - Performed by helper: Don/doff right TED hose, Don/doff left TED hose Assist for lower body dressing: 2 Helpers  Function - Toileting Toileting activity did not occur: Safety/medical concerns Toileting steps completed by helper: Adjust clothing prior to toileting, Performs perineal hygiene, Adjust clothing after toileting Assist level: Two helpers  Function - Air cabin crew transfer activity did not occur: Safety/medical concerns Toilet transfer assistive device: Elevated toilet seat/BSC over toilet(STEDY +2) Assist level to toilet: Maximal assist (Pt 25 - 49%/lift and lower) Assist level from toilet: Maximal assist (Pt 25 - 49%/lift and lower)  Function - Chair/bed transfer Chair/bed transfer activity did not occur: Refused Chair/bed transfer method: Other Chair/bed transfer assist level: 2 helpers Chair/bed transfer assistive device: Facilities manager lift: Stedy  Function - Locomotion: Wheelchair Type: Manual Wheelchair activity did not occur: Refused Max wheelchair distance: 125 Assist Level: Touching or steadying assistance (Pt > 75%) Wheel 50 feet with 2 turns activity did not occur: Refused Assist Level: Touching or steadying assistance (Pt > 75%) Wheel 150 feet activity did not occur: Refused Function - Locomotion: Ambulation Ambulation activity did not occur: Safety/medical concerns Walk 10 feet activity did not occur: Safety/medical concerns Walk 50 feet with 2 turns activity did not occur: Safety/medical concerns Walk 150 feet activity did not occur:  Safety/medical concerns Walk 10 feet on uneven surfaces activity did not occur: Safety/medical concerns  Function - Comprehension Comprehension: Auditory Comprehension assist level: Follows complex conversation/direction with extra time/assistive device  Function - Expression Expression: Verbal Expression assist level: Expresses complex ideas: With extra time/assistive device  Function - Social Interaction Social Interaction assist level: Interacts appropriately 90% of the time - Needs monitoring or encouragement for participation or interaction.  Function - Problem Solving Problem solving assist level: Solves basic 50 - 74% of the time/requires cueing 25 - 49% of the time  Function - Memory Memory assist level: Recognizes or recalls 90% of the time/requires cueing < 10% of the time Patient normally able to recall (first 3 days only): That he or she is in a hospital, Staff names and faces, Current season  Medical Problem List and Plan:  1. Functional deficits secondary to L4-5 osteomyelitis/discitis, lumbar spondylosis   Continue CIR  Xray ordered, reviewed stable 2. DVT Prophylaxis/Anticoagulation: Mechanical: Sequential compression devices, below knee Bilateral lower extremities   Doppler negative for DVT in LE  LUE doppler ordered to evaluate for DVT, negative 3. Pain Management: Scheduled oxycodone prior to therapy to help with activity tolerance, decreased to 5 mg on 6/27.   Added ultram prn for moderate pain. Monitor and titrate medications to help with neuropathic symptoms. Decadron d/ced.   RLE radicular pain, partially improved on gabapentin to 675m BID, decreased to 300 mg twice a day, DC'd on 6/27 due to lethargy and tremors. Gabapentin 100 3 times a day started on/3   Low dose dexamethasone DC'd on 6/26  Baclofen changed to 5 mg daily at bedtime, patient unable to tolerate higher doses, DC'd on 6/27 due to lethargy  Lidoderm patch added on 7/2 4. Mood: LCSW to follow  for evaluation and support.  5. Neuropsych: This patient is capable of making decisions on her own behalf.  6. Skin/Wound Care: routine pressure relief measures  7. Fluids/Electrolytes/Nutrition: Monitor I/O.   8. Lumbar diskitis: Afebrile on scheduled tylenol. On Daptomycin and ceftriaxone.  9. Acute on chronic anemia: Treated with iron and procrit on outpatient basis. Baseline Hgb- 9-10 range? Will continue to monitor serially   Hemoglobin 9.5 on 7/2  Hemoccult positive, however hemoglobin stable  Continue to monitor 10. Symptomatic Bradycardia s/p PPM: Monitor HR bid.   Controlled at present 11. GIB with thrombocytopenia: Resolved  Cont to monitor 12. New diagnosis T2DM: Hgb A1c- 6.3. Poorly controlled due to recent rounds of steroids--Lantus was titrated upwards for better control. Was not on any medications at home. Monitor BS ac/hs--watch for hypoglycemic episodes as steroids tapered off.  CBG (last 3)  Recent Labs    06/03/18 1708 06/03/18 2142 06/04/18 0634  GLUCAP 110* 119* 82   Relatively controlled on 7/4 13. Neuropathy BLE: Continue Gabapentin bid--monitor for SE.  14. Hyponatremia:   Sodium 135 on 7/4  Added protein supplements with meals. monitor for now.   Continue to monitor 15. Stage IV CKD: Baseline SCr 3.5-4.0. Monitor for SE/asterixis with narcotics.   Creatinine 1.73 on 7/4  Encourage fluids, IVF DC'd on 6/30  Continue to monitor 16. Fluid overload: Monitor daily weights and for signs of overload.   Lasix increased to 80 mg daily on 6/27 (PTCA 80 twice a day) Filed Weights   06/02/18 0500 06/03/18 0500 06/04/18 0459  Weight: 95.4 kg (210 lb 5.1 oz) 93.3 kg (205 lb 11 oz) 93.4 kg (206 lb)   Stable on 7/3 17. Morbid obesity  Encourage weight loss 18. Essential hypertension  Continue Norvasc 10 mg daily  Hydralazine 50 mg 3 times a day, increased to 75 on 6/24  Controlled on 7/4 19. Hypoalbuminemia  Supplement  initiated 20. Hypokalemia  Potassium 3.4 on  7/4, supplement initiated, increased on 7/4  Continue to monitor 21. AMS: Improving  Likely medication induced  CXR reviewed, ECG reviewed, head CT reviewed, EEG performed - ? Borderline QTC prolongation  Discussed with pharmacy  TSH, free T4 within acceptable range  Free T3 low  Continues to have low grade fever  UA unremarkable.  Urine culture no growth  Leukocytosis resolved with WBCs 9.7 on 7/2  Blood cultures no growth to date on 7/4 22. Hypothyroidism  Continue meds   See #21 23.  Sleep disturbance  Melatonin initiated on 6/30, increased on 7/1  LOS (Days) 13 A FACE TO FACE EVALUATION WAS PERFORMED  Naji Mehringer [name] 06/04/2018, 8:21 AM

## 2018-06-04 NOTE — Progress Notes (Signed)
Physical Therapy Session Note  Patient Details  Name: Erika Fry MRN: [id number] Date of Birth: 02/19/1946  Today's Date: 06/04/2018 PT Individual Time: 0900-1000 PT Individual Time Calculation (min): 60 min   Short Term Goals: Week 1:  PT Short Term Goal 1 (Week 1): Pt will tolerate 1 hour of sitting up in chair w/ minimal increase in pain or fatigue PT Short Term Goal 1 - Progress (Week 1): Not met PT Short Term Goal 2 (Week 1): Pt will transfer bed<>chair w/ max assist x1 PT Short Term Goal 2 - Progress (Week 1): Not met PT Short Term Goal 3 (Week 1): Pt will initiate gait training PT Short Term Goal 3 - Progress (Week 1): Not met PT Short Term Goal 4 (Week 1): Pt will perform sit<>stand w/ LRAD, max assist x1 PT Short Term Goal 4 - Progress (Week 1): Not met Week 2:  PT Short Term Goal 1 (Week 2): Pt will tolerate 1 hour of sitting up in chair w/ minimal increase in pain or fatigue PT Short Term Goal 2 (Week 2): Pt will transfer bed<>chair w/ max assist x1 PT Short Term Goal 3 (Week 2): Pt will perform sit<>stand w/ LRAD, max assist x1  Skilled Therapeutic Interventions/Progress Updates:   Pt received supine in bed and agreeable to PT. Supine>sit transfer with total assist with roll to the R cues.Sit<>stand at EOB from elevated height in stedy with max assist +2 for stedyparts management. Transfer to WC in Stedy with max assist +2 for safety and breathing control to reduce anxiety. Beezy board transfer to mat table with total A +2 to stabilize board and UE support on PT to mat table and UE support on board and WC arm rest to return to WC following seated balance. Sitting balance EOB with min-mod assist due to LPB x 6 minutes. Partial sit<>stand in stedy from TIS WC with total assist from PT; Improved anterior weight shift following cues from PT on second attempt with ability to increase clearance from seat. Patient returned to room and left sitting in WC with call bell in reach and  all needs met.           Therapy Documentation Precautions:  Precautions Precautions: Fall Precaution Comments: Pacemaker placement 4 weeks ago Required Braces or Orthoses: Spinal Brace(LSO for comfort) Spinal Brace: Lumbar corset Restrictions Weight Bearing Restrictions: No Pain: 8/10 low back pain and R hip pain  See Function Navigator for Current Functional Status.   Therapy/Group: Individual Therapy  Austin E Tucker 06/04/2018, 9:57 AM  

## 2018-06-04 NOTE — Progress Notes (Signed)
Occupational Therapy Session Note  Patient Details  Name: Erika Fry MRN: [id number] Date of Birth: 1946-11-23  Today's Date: 06/04/2018 OT Individual Time: 1300-1400 OT Individual Time Calculation (min): 60 min    Short Term Goals: Week 1:  OT Short Term Goal 1 (Week 1): Pt will report no more than 5/10 pain in order to participate more functionally in ADLs OT Short Term Goal 1 - Progress (Week 1): Not met OT Short Term Goal 2 (Week 1): Pt will sit EOB for >10 min with no more than mod A to participate in morning ADL routine OT Short Term Goal 2 - Progress (Week 1): Not met OT Short Term Goal 3 (Week 1): Pt will don shirt with min A  OT Short Term Goal 3 - Progress (Week 1): Not met OT Short Term Goal 4 (Week 1): Pt will adhere to back precautions during bed mobility with no more than 2 vc  OT Short Term Goal 4 - Progress (Week 1): Met  Skilled Therapeutic Interventions/Progress Updates:    1:1 Pt still with significant intermittent pain in right hip/ glut with very minimal movement and/or changes of bed positioning. Pt transitioned from elevated bed over to tilt table (with maxi slides) with focus on tolerating upright posturing and bilateral LE weight bearing . Pt able to tolerate ~60 degress and able to perform mimi squats ~12 before needed to recline further back. Pt tolerated being upright in the tilt table for ~ 20 min before wanting to go back to be.  Initially wanting to try to go to the bathroom.  Max A +2 to come into sitting EOB from supine. Pt but then decided not to transfer with STEDY to [facility] and returned to supine with max A +2.  Left resting in the bed.   Therapy Documentation Precautions:  Precautions Precautions: Fall Precaution Comments: Pacemaker placement 4 weeks ago Required Braces or Orthoses: Spinal Brace Spinal Brace: Lumbar corset Restrictions Weight Bearing Restrictions: No Pain: Pain Assessment Pain Scale: Faces Faces Pain Scale: Hurts even  more Pain Type: Chronic pain Pain Location: Back Pain Orientation: Right Pain Descriptors / Indicators: Jabbing Pain Onset: With Activity Pain Intervention(s): Repositioned ADL: ADL ADL Comments: See functional navigator  See Function Navigator for Current Functional Status.   Therapy/Group: Individual Therapy  Willeen Cass [facility] 06/04/2018, 3:24 PM

## 2018-06-05 ENCOUNTER — Inpatient Hospital Stay (HOSPITAL_COMMUNITY): Payer: Medicare Other | Admitting: Occupational Therapy

## 2018-06-05 ENCOUNTER — Inpatient Hospital Stay (HOSPITAL_COMMUNITY): Payer: Medicare Other | Admitting: Physical Therapy

## 2018-06-05 ENCOUNTER — Inpatient Hospital Stay (HOSPITAL_COMMUNITY): Payer: Medicare Other

## 2018-06-05 LAB — GLUCOSE, CAPILLARY
GLUCOSE-CAPILLARY: 182 mg/dL — AB (ref 70–99)
Glucose-Capillary: 107 mg/dL — ABNORMAL HIGH (ref 70–99)
Glucose-Capillary: 136 mg/dL — ABNORMAL HIGH (ref 70–99)
Glucose-Capillary: 184 mg/dL — ABNORMAL HIGH (ref 70–99)

## 2018-06-05 MED ORDER — ACETAMINOPHEN 325 MG PO TABS
650.0000 mg | ORAL_TABLET | Freq: Three times a day (TID) | ORAL | Status: DC
  Administered 2018-06-05 – 2018-06-22 (×69): 650 mg via ORAL
  Filled 2018-06-05 (×69): qty 2

## 2018-06-05 NOTE — Progress Notes (Signed)
Occupational Therapy Session Note  Patient Details  Name: Erika Fry MRN: [id number] Date of Birth: December 09, 1945  Today's Date: 06/05/2018 OT Individual Time: 0254-2706 OT Individual Time Calculation (min): 72 min    Short Term Goals: Week 1:  OT Short Term Goal 1 (Week 1): Pt will report no more than 5/10 pain in order to participate more functionally in ADLs OT Short Term Goal 1 - Progress (Week 1): Not met OT Short Term Goal 2 (Week 1): Pt will sit EOB for >10 min with no more than mod A to participate in morning ADL routine OT Short Term Goal 2 - Progress (Week 1): Not met OT Short Term Goal 3 (Week 1): Pt will don shirt with min A  OT Short Term Goal 3 - Progress (Week 1): Not met OT Short Term Goal 4 (Week 1): Pt will adhere to back precautions during bed mobility with no more than 2 vc  OT Short Term Goal 4 - Progress (Week 1): Met  Skilled Therapeutic Interventions/Progress Updates:    1;1. Pt received seated in w/c with RN attempting to fix IV beeping. Beeping persisted~20 min while pt grooms at sink with supervision. Pt requires encouragement to lift BUE to reach for objects on B sides of sink to grush teeth, hair and apply lipstick. Pt completes 1 min per exercise of seated towel glides for gravity eliminated strengthening with 2# wrist weights at high low table: protraction/retraction, shoulder flex/ext, elbow flex/ext, int/ext rotation, horizontal ab/adduction, and circles in B directions. Pt competes 4x10 ball toss (bounce and chest pass) seated in w/c for BUE endurance/strengthening/coordination required for BADLs. Pt unable to achieve >30 degrees shoulder flexion when passing basketball. Pt completes transfer back to bed with stedy and +2 A. Pt demo difficulty achieving full upright posture as chest is stooped despite cueing. Exited session with pt seated in bed, call light in reah and all need smet  Therapy Documentation Precautions:  Precautions Precautions:  Fall Precaution Comments: Pacemaker placement 4 weeks ago Required Braces or Orthoses: Spinal Brace Spinal Brace: Lumbar corset Restrictions Weight Bearing Restrictions: No  See Function Navigator for Current Functional Status.   Therapy/Group: Individual Therapy  Tonny Branch 06/05/2018, 2:13 PM

## 2018-06-05 NOTE — Plan of Care (Signed)
  Problem: RH SKIN INTEGRITY Goal: RH STG MAINTAIN SKIN INTEGRITY WITH ASSISTANCE Description STG Maintain Skin Integrity With Assistance. mod  Outcome: Progressing  Continue to assess and turn pt.

## 2018-06-05 NOTE — Progress Notes (Signed)
Occupational Therapy Session Note  Patient Details  Name: Erika Fry MRN: [id number] Date of Birth: 1946-04-04  Today's Date: 06/05/2018 OT Individual Time: 1032-1105 OT Individual Time Calculation (min): 33 min    Short Term Goals: Week 2:  OT Short Term Goal 1 (Week 2): Pt will move from sidelying to sit with mod A of 1 to prepare for bathing/dressing EOB or in wc. OT Short Term Goal 2 (Week 2): Pt will maintain static sitting EOB with min A for 5-10 min. OT Short Term Goal 3 (Week 2): Pt will don shirt with min A. OT Short Term Goal 4 (Week 2): Pt will sit to stand in bariatric Stedy with mod A of 1 to prepare for transfer to w/c.  Skilled Therapeutic Interventions/Progress Updates:    Treatment session with focus on OOB tolerance and increased participation in self-care tasks.  Pt received supine in bed reporting pain in Rt hip but reports having received pain meds and pain is "easing off".  Completed bed mobility with max assist and max cues for breathing and sequencing as pt would resist movement, increasing pain.  Donned pants seated EOB and required +2 in Stedy to facilitate anterior weight shift to lift buttocks for standing.  Max cues for hand placement and visual cues to facilitate weight shift and keeping chest upright to allow for weight shift into standing.  +2 to pull pants over hips while pt maintained standing in [facility].  Engaged in grooming tasks at sink seated on Stedy seat with cues for upright trunk and reaching to facilitate upright posture. Pt with intermittent groans of pain but able to complete grooming tasks at sink.  Transferred to reclining w/c with pt demonstrating increased initiation with sit > stand on Stedy with improved hand placement as well, however still requiring +2 assist.  Pt passed off to PT.    Therapy Documentation Precautions:  Precautions Precautions: Fall Precaution Comments: Pacemaker placement 4 weeks ago Required Braces or Orthoses:  Spinal Brace Spinal Brace: Lumbar corset Restrictions Weight Bearing Restrictions: No General:   Vital Signs: Therapy Vitals BP: (!) 121/59 Patient Position (if appropriate): Sitting Pain: Pain Assessment Pain Scale: 0-10 Pain Score: 7  Pain Type: Acute pain Pain Location: Hip Pain Orientation: Right Pain Intervention(s): Medication (See eMAR)  See Function Navigator for Current Functional Status.   Therapy/Group: Individual Therapy  Mae Denunzio, [place] 06/05/2018, 11:11 AM

## 2018-06-05 NOTE — Progress Notes (Signed)
Subjective/Complaints: Patient seen lying in bed this morning. She states she slept well overnight. She has questions about her recent imaging.  ROS: Denies CP, S OB, nausea, vomiting diarrhea.  Objective: Vital Signs: Blood pressure 122/72, pulse (!) 102, temperature 98.5 F (36.9 C), temperature source Oral, resp. rate 20, height _0  (1.651 m), weight 91.6 kg (201 lb 15.1 oz), SpO2 91 %. Dg Lumbar Spine Complete  Result Date: 06/03/2018 CLINICAL DATA:  Low back pain EXAM: LUMBAR SPINE - COMPLETE 4+ VIEW COMPARISON:  05/13/2018 FINDINGS: Five lumbar type vertebral bodies are well visualized. Vertebral body height is well maintained. There is indistinctness of the inferior endplate at L4 and superior endplate at L5 consistent with the known history of underlying discitis and osteomyelitis. The overall appearance is similar to that seen on recent MRI examination. No compression deformity is seen. Aortic calcifications are noted. IMPRESSION: Changes consistent with the given clinical history of osteomyelitis and discitis at L4-5. Electronically Signed   By: Inez Catalina M.D.   On: 06/03/2018 16:22   Results for orders placed or performed during the hospital encounter of 05/22/18 (from the past 72 hour(s))  Glucose, capillary     Status: Abnormal   Collection Time: 06/02/18 11:18 AM  Result Value Ref Range   Glucose-Capillary 105 (H) 70 - 99 mg/dL  Glucose, capillary     Status: Abnormal   Collection Time: 06/02/18  4:33 PM  Result Value Ref Range   Glucose-Capillary 115 (H) 70 - 99 mg/dL  Glucose, capillary     Status: Abnormal   Collection Time: 06/02/18  9:29 PM  Result Value Ref Range   Glucose-Capillary 101 (H) 70 - 99 mg/dL  Glucose, capillary     Status: Abnormal   Collection Time: 06/03/18  6:23 AM  Result Value Ref Range   Glucose-Capillary 108 (H) 70 - 99 mg/dL  Glucose, capillary     Status: None   Collection Time: 06/03/18 11:46 AM  Result Value Ref Range   Glucose-Capillary 95 70 - 99 mg/dL  Glucose, capillary     Status: Abnormal   Collection Time: 06/03/18  5:08 PM  Result Value Ref Range   Glucose-Capillary 110 (H) 70 - 99 mg/dL  Glucose, capillary     Status: Abnormal   Collection Time: 06/03/18  9:42 PM  Result Value Ref Range   Glucose-Capillary 119 (H) 70 - 99 mg/dL  Basic metabolic panel     Status: Abnormal   Collection Time: 06/04/18  4:18 AM  Result Value Ref Range   Sodium 135 135 - 145 mmol/L   Potassium 3.4 (L) 3.5 - 5.1 mmol/L   Chloride 96 (L) 98 - 111 mmol/L    Comment: Please note change in reference range.   CO2 29 22 - 32 mmol/L   Glucose, Bld 98 70 - 99 mg/dL    Comment: Please note change in reference range.   BUN 28 (H) 8 - 23 mg/dL    Comment: Please note change in reference range.   Creatinine, Ser 1.73 (H) 0.44 - 1.00 mg/dL   Calcium 9.1 8.9 - 10.3 mg/dL   GFR calc non Af Amer 28 (L) >60 mL/min   GFR calc Af Amer 32 (L) >60 mL/min    Comment: (NOTE) The eGFR has been calculated using the CKD EPI equation. This calculation has not been validated in all clinical situations. eGFR's persistently <60 mL/min signify possible Chronic Kidney Disease.    Anion gap 10 5 - 15  Comment: Performed at [facility], [place] [address], [place], [place] [address]  CK     Status: None   Collection Time: 06/04/18  4:18 AM  Result Value Ref Range   Total CK 150 38 - 234 U/L    Comment: Performed at [facility], [place] [address], [place], [place] [address]  Glucose, capillary     Status: None   Collection Time: 06/04/18  6:34 AM  Result Value Ref Range   Glucose-Capillary 82 70 - 99 mg/dL  Glucose, capillary     Status: Abnormal   Collection Time: 06/04/18 11:44 AM  Result Value Ref Range   Glucose-Capillary 157 (H) 70 - 99 mg/dL  Glucose, capillary     Status: Abnormal   Collection Time: 06/04/18  4:56 PM  Result Value Ref Range   Glucose-Capillary 143 (H) 70 - 99 mg/dL  Glucose, capillary     Status:  Abnormal   Collection Time: 06/04/18 10:24 PM  Result Value Ref Range   Glucose-Capillary 134 (H) 70 - 99 mg/dL   Comment 1 Notify RN   Glucose, capillary     Status: Abnormal   Collection Time: 06/05/18  6:47 AM  Result Value Ref Range   Glucose-Capillary 107 (H) 70 - 99 mg/dL   Comment 1 Notify RN      Constitutional: No distress . Vital signs reviewed. HENT: Normocephalic.  Atraumatic. Eyes: EOMI. No discharge. Cardiovascular: RRR. No JVD. Respiratory: CTA bilaterally. Normal effort. GI: BS +. Non-distended. Musc: Generalized edema, stable Neuro: Alert Motor:  Bilateral upper extremities: 4+/5 proximal distal Bilateral lower extremities: Hip flexion 1+/5, knee extension 3/5, ankle dorsiflexion 5/5 (left stronger than right, unchanged) Skin: Warm and dry. Intact. Psych: Normal mood.  Normal behavior.  Assessment/Plan: 1. Functional deficits secondary to L4-5 Lumbar discitis which require 3+ hours per day of interdisciplinary therapy in a comprehensive inpatient rehab setting. Physiatrist is providing close team supervision and 24 hour management of active medical problems listed below. Physiatrist and rehab team continue to assess barriers to discharge/monitor patient progress toward functional and medical goals. FIM: Function - Bathing Bathing activity did not occur: Refused Position: Bed Body parts bathed by patient: Right arm, Left arm, Chest, Abdomen Body parts bathed by helper: Right lower leg, Left lower leg, Right upper leg, Left upper leg, Back, Front perineal area, Buttocks Assist Level: 2 helpers  Function- Upper Body Dressing/Undressing Upper body dressing/undressing activity did not occur: Refused What is the patient wearing?: Pull over shirt/dress Pull over shirt/dress - Perfomed by patient: Thread/unthread right sleeve, Thread/unthread left sleeve Pull over shirt/dress - Perfomed by helper: Put head through opening, Pull shirt over trunk Orthosis activity  level: Performed by helper Assist Level: Touching or steadying assistance(Pt > 75%) Function - Lower Body Dressing/Undressing Lower body dressing/undressing activity did not occur: Refused What is the patient wearing?: Pants, [employer], Shoes Position: Bed Pants- Performed by helper: Thread/unthread right pants leg, Thread/unthread left pants leg, Pull pants up/down Non-skid slipper socks- Performed by helper: Don/doff right sock, Don/doff left sock Shoes - Performed by helper: Don/doff right shoe, Don/doff left shoe TED Hose - Performed by helper: Don/doff right TED hose, Don/doff left TED hose Assist for lower body dressing: 2 Helpers  Function - Toileting Toileting activity did not occur: Safety/medical concerns Toileting steps completed by helper: Adjust clothing prior to toileting, Performs perineal hygiene, Adjust clothing after toileting Assist level: Two helpers  Function - Air cabin crew transfer activity did not occur: Safety/medical  concerns Toilet transfer assistive device: Elevated toilet seat/BSC over toilet(STEDY +2) Assist level to toilet: Maximal assist (Pt 25 - 49%/lift and lower) Assist level from toilet: Maximal assist (Pt 25 - 49%/lift and lower)  Function - Chair/bed transfer Chair/bed transfer activity did not occur: Refused Chair/bed transfer method: Stand pivot Chair/bed transfer assist level: 2 helpers Chair/bed transfer assistive device: Mechanical lift Mechanical lift: Stedy  Function - Locomotion: Wheelchair Type: Manual Wheelchair activity did not occur: Refused Max wheelchair distance: 125 Assist Level: Touching or steadying assistance (Pt > 75%) Wheel 50 feet with 2 turns activity did not occur: Refused Assist Level: Touching or steadying assistance (Pt > 75%) Wheel 150 feet activity did not occur: Refused Function - Locomotion: Ambulation Ambulation activity did not occur: Safety/medical concerns Walk 10 feet activity did not occur:  Safety/medical concerns Walk 50 feet with 2 turns activity did not occur: Safety/medical concerns Walk 150 feet activity did not occur: Safety/medical concerns Walk 10 feet on uneven surfaces activity did not occur: Safety/medical concerns  Function - Comprehension Comprehension: Auditory Comprehension assist level: Follows complex conversation/direction with extra time/assistive device  Function - Expression Expression: Verbal Expression assist level: Expresses complex ideas: With extra time/assistive device  Function - Social Interaction Social Interaction assist level: Interacts appropriately 90% of the time - Needs monitoring or encouragement for participation or interaction.  Function - Problem Solving Problem solving assist level: Solves basic 50 - 74% of the time/requires cueing 25 - 49% of the time  Function - Memory Memory assist level: Recognizes or recalls 75 - 89% of the time/requires cueing 10 - 24% of the time Patient normally able to recall (first 3 days only): That he or she is in a hospital, Staff names and faces, Current season  Medical Problem List and Plan:  1. Functional deficits secondary to L4-5 osteomyelitis/discitis, lumbar spondylosis   Continue CIR  Xray ordered, reviewed stable 2. DVT Prophylaxis/Anticoagulation: Mechanical: Sequential compression devices, below knee Bilateral lower extremities   Doppler negative for DVT in LE  LUE doppler ordered to evaluate for DVT, negative 3. Pain Management: Scheduled oxycodone prior to therapy to help with activity tolerance, decreased to 5 mg on 6/27.   Added ultram prn for moderate pain. Monitor and titrate medications to help with neuropathic symptoms. Decadron d/ced.   RLE radicular pain, partially improved on gabapentin to 655m BID, decreased to 300 mg twice a day, DC'd on 6/27 due to lethargy and tremors. Gabapentin 100 3 times a day started on/3   Low dose dexamethasone DC'd on 6/26  Baclofen changed to 5 mg  daily at bedtime, patient unable to tolerate higher doses, DC'd on 6/27 due to lethargy  Lidoderm patch added on 7/2  Overall improving 4. Mood: LCSW to follow for evaluation and support.  5. Neuropsych: This patient is capable of making decisions on her own behalf.  6. Skin/Wound Care: routine pressure relief measures  7. Fluids/Electrolytes/Nutrition: Monitor I/O.   8. Lumbar diskitis: Afebrile on scheduled tylenol. On Daptomycin and ceftriaxone.  9. Acute on chronic anemia: Treated with iron and procrit on outpatient basis. Baseline Hgb- 9-10 range? Will continue to monitor serially   Hemoglobin 9.5 on 7/2  Labs ordered for Monday  Hemoccult positive, however hemoglobin stable  Continue to monitor 10. Symptomatic Bradycardia s/p PPM: Monitor HR bid.   Resolved 11. GIB with thrombocytopenia: Resolved  Cont to monitor 12. New diagnosis T2DM: Hgb A1c- 6.3. Poorly controlled due to recent rounds of steroids--Lantus was titrated upwards for better  control. Was not on any medications at home. Monitor BS ac/hs--watch for hypoglycemic episodes as steroids tapered off.  CBG (last 3)  Recent Labs    06/04/18 1656 06/04/18 2224 06/05/18 0647  GLUCAP 143* 134* 107*   Labile on 7/5 13. Neuropathy BLE: Continue Gabapentin bid--monitor for SE.  14. Hyponatremia:   Sodium 135 on 7/4  Added protein supplements with meals. monitor for now.   Continue to monitor 15. Stage IV CKD: Baseline SCr 3.5-4.0. Monitor for SE/asterixis with narcotics.   Creatinine 1.73 on 7/4  Encourage fluids, IVF [place] on 6/30  Labs ordered for tomorrow  Continue to monitor 16. Fluid overload: Monitor daily weights and for signs of overload.   Lasix increased to 80 mg daily on 6/27 (PTCA 80 twice a day) Filed Weights   06/03/18 0500 06/04/18 0459 06/05/18 0500  Weight: 93.3 kg (205 lb 11 oz) 93.4 kg (206 lb) 91.6 kg (201 lb 15.1 oz)   Stable on 7/5 17. Morbid obesity  Encourage weight loss 18. Essential  hypertension  Continue Norvasc 10 mg daily  Hydralazine 50 mg 3 times a day, increased to 75 on 6/24  Labile on 7/5 19. Hypoalbuminemia  Supplement initiated 20. Hypokalemia  Potassium 3.4 on 7/4, supplement initiated, increased on 7/4  Continue to monitor 21. AMS: Resolved  Likely medication induced  CXR reviewed, ECG reviewed, head CT reviewed, EEG performed - ? Borderline QTC prolongation  Discussed with pharmacy  TSH, free T4 within acceptable range  Free T3 low  Continues to have low grade fever  UA unremarkable.  Urine culture no growth  Leukocytosis resolved with WBCs 9.7 on 7/2  Blood cultures no growth to date on 7/4 22. Hypothyroidism  Continue meds   See #21 23.  Sleep disturbance  Melatonin initiated on 6/30, increased on 7/1  LOS (Days) 14 A FACE TO FACE EVALUATION WAS PERFORMED  Delsa Walder [name] 06/05/2018, 8:58 AM

## 2018-06-05 NOTE — Progress Notes (Signed)
Physical Therapy Weekly Progress Note  Patient Details  Name: Erika Fry MRN: [id number] Date of Birth: 09/24/1946  Beginning of progress report period: May 31, 2018 End of progress report period: June 05, 2018  Today's Date: 06/05/2018 PT Individual Time: 1100-1200 PT Individual Time Calculation (min): 60 min   Patient has met 1 of 3 short term goals.  Pts has made mild progress over the last week,but pt continues to be fearful of all mobility following adverse reaction to medication over the previous weekend.  Patient continues to demonstrate the following deficits muscle weakness and muscle joint tightness, decreased cardiorespiratoy endurance, ,, decreased awareness, decreased problem solving, decreased safety awareness, decreased memory and delayed processing and decreased sitting balance, decreased standing balance, decreased postural control and decreased balance strategies and therefore will continue to benefit from skilled PT intervention to increase functional independence with mobility.  Patient progressing toward long term goals..  Continue plan of care.  PT Short Term Goals Week 2:  PT Short Term Goal 1 (Week 2): Pt will tolerate 1 hour of sitting up in chair w/ minimal increase in pain or fatigue PT Short Term Goal 1 - Progress (Week 2): Met PT Short Term Goal 2 (Week 2): Pt will transfer bed<>chair w/ max assist x1 PT Short Term Goal 2 - Progress (Week 2): Partly met PT Short Term Goal 3 (Week 2): Pt will perform sit<>stand w/ LRAD, max assist x1 PT Short Term Goal 3 - Progress (Week 2): Partly met Week 3:  PT Short Term Goal 1 (Week 3): Pt will perform sit<>stand with max assist of 1 outside of Stedy PT Short Term Goal 2 (Week 3): Pt will initiate gait training  PT Short Term Goal 3 (Week 3): Pt will maintain standing tolerance x 2 min to prepare for ambulation PT Short Term Goal 4 (Week 3): Pt will perform bed mobility with mod assist  PT Short Term Goal 5 (Week 3):  Pt will perform bed<>chair transfer with max assist of 1 consistently and LRAD  Week 4:     Skilled Therapeutic Interventions/Progress Updates:   Pt received sitting in WC and agreeable to PT. Kinetron 6 bouts x 30sec and one additional bout x 45 sec. Moderate cues from PT for improved activation of the RLE into extension and improved ROM.   Sit<>stand in stedy from [facility] with max assist of 1 x 5 with max cues for set up, improved anteiror weight shift, and extension through trunk to maintain standing position. Standing tolerance up to 30sec with mod-max assist from PT to facilitate hip  And trunk extension    BLE strengthening in sitting: LAQ x 10, hip adduction x 12, hip abduction x 12, hip flexion within available range x 10. Ankle PF/DF 2x30 sec. Cues from PT for improved control of eccentric movement to maximize strengthening aspects of exercise. .   Patient returned to room and left sitting in [facility] with call bell in reach and all needs met.   .      Therapy Documentation Precautions:  Precautions Precautions: Fall Precaution Comments: Pacemaker placement 4 weeks ago Required Braces or Orthoses: Spinal Brace Spinal Brace: Lumbar corset Restrictions Weight Bearing Restrictions: No Vital Signs: Therapy Vitals Temp: 98.5 F (36.9 C) Temp Source: Oral Pulse Rate: (!) 102 Resp: 20 BP: 122/72 Patient Position (if appropriate): Lying Oxygen Therapy SpO2: 91 % O2 Device: Room Air Pain: 8/10 with movement. Low back   See Function Navigator for Current Functional Status.  Therapy/Group: Individual  Therapy  [name] 06/05/2018, 8:03 AM

## 2018-06-06 ENCOUNTER — Inpatient Hospital Stay (HOSPITAL_COMMUNITY): Payer: Medicare Other | Admitting: Physical Therapy

## 2018-06-06 LAB — GLUCOSE, CAPILLARY
Glucose-Capillary: 124 mg/dL — ABNORMAL HIGH (ref 70–99)
Glucose-Capillary: 138 mg/dL — ABNORMAL HIGH (ref 70–99)
Glucose-Capillary: 139 mg/dL — ABNORMAL HIGH (ref 70–99)
Glucose-Capillary: 129 mg/dL — ABNORMAL HIGH (ref 70–99)

## 2018-06-06 NOTE — Progress Notes (Signed)
Patient ID: Erika Fry, female   DOB: [birth year], 72 y.o.   MRN: [id number]   Erika Fry is a 72 y.o. female  Who is admitted with functional deficits for CIR secondary to L4-5 osteomyelitis/discitis  Subjective: No new complaints. No new problems. Slept well.  Past Medical History:  Diagnosis Date  . Arthritis    "knees, thumbs" (03/25/2018)  . CKD (chronic kidney disease), stage IV ([place])   . Diet-controlled diabetes mellitus ([place])   . GERD (gastroesophageal reflux disease)   . Gout    "on daily RX" (03/25/2018)  . Heart murmur   . High cholesterol   . Hypertension   . Hypothyroidism   . Iron deficiency anemia    "had to get an iron infusion"  . Migraine    "used to have them growing up" (03/25/2018)  . Presence of permanent cardiac pacemaker 03/25/2018     Objective: Vital signs in last 24 hours: Temp:  [97.7 F (36.5 C)-98.7 F (37.1 C)] 98.2 F (36.8 C) (07/06 0529) Pulse Rate:  [80-104] 104 (07/06 0529) Resp:  [17-20] 20 (07/06 0529) BP: (113-139)/(59-90) 133/66 (07/06 0529) SpO2:  [92 %-95 %] 95 % (07/06 0529) Weight:  [204 lb 5.9 oz (92.7 kg)] 204 lb 5.9 oz (92.7 kg) (07/06 0529) Weight change: 2 lb 6.8 oz (1.1 kg) Last BM Date: 06/05/18  Intake/Output from previous day: 07/05 0701 - 07/06 0700 In: 855 [P.O.:720; I.V.:20; IV Piggyback:115] Out: 300 [Urine:300] Last cbgs: CBG (last 3)  Recent Labs    06/05/18 1645 06/05/18 2129 06/06/18 0651  GLUCAP 182* 184* 124*   Patient Vitals for the past 24 hrs:  BP Temp Temp src Pulse Resp SpO2 Weight  06/06/18 0529 133/66 98.2 F (36.8 C) Oral (!) 104 20 95 % 204 lb 5.9 oz (92.7 kg)  06/05/18 1944 113/72 98.7 F (37.1 C) Oral 91 18 92 % -  06/05/18 1700 139/90 97.7 F (36.5 C) Oral 80 17 95 % -  06/05/18 1105 (!) 121/59 - - - - - -    Physical Exam General: No apparent distress obese HEENT: not dry Lungs: Normal effort. Lungs clear to auscultation, no crackles or wheezes. Cardiovascular:  Regular rate and rhythm,  heart rate approximately 100; grade 7-5/1 systolic murmur Abdomen: S/NT/ND; BS(+) Musculoskeletal:  unchanged Neurological: No new neurological deficits Extremities-SCDs in place Skin: clear   Mental state: Alert, oriented, cooperative    Lab Results: BMET    Component Value Date/Time   NA 135 06/04/2018 0418   K 3.4 (L) 06/04/2018 0418   CL 96 (L) 06/04/2018 0418   CO2 29 06/04/2018 0418   GLUCOSE 98 06/04/2018 0418   BUN 28 (H) 06/04/2018 0418   CREATININE 1.73 (H) 06/04/2018 0418   CALCIUM 9.1 06/04/2018 0418   CALCIUM 9.6 10/31/2017 1050   GFRNONAA 28 (L) 06/04/2018 0418   GFRAA 32 (L) 06/04/2018 0418   CBC    Component Value Date/Time   WBC 9.7 06/02/2018 0421   RBC 3.22 (L) 06/02/2018 0421   HGB 9.5 (L) 06/02/2018 0421   HCT 29.2 (L) 06/02/2018 0421   HCT 20.1 (L) 05/13/2018 0427   PLT 158 06/02/2018 0421   MCV 90.7 06/02/2018 0421   MCH 29.5 06/02/2018 0421   MCHC 32.5 06/02/2018 0421   RDW 15.4 06/02/2018 0421   LYMPHSABS 0.6 (L) 06/02/2018 0421   MONOABS 0.6 06/02/2018 0421   EOSABS 0.2 06/02/2018 0421   BASOSABS 0.0 06/02/2018 0421    Medications: I  have reviewed the patient's current medications.  Assessment/Plan:  Functional deficits secondary to L4-5 lumbar discitis.  Continue CIR.  Continue Rocephin and daptomycin DVT prophylaxis.  Continue SCDs Pain management.  Continue efforts to taper oxycodone T2DM.  Fasting blood sugar today 124.  Continue to monitor closely Chronic kidney disease stage IV.  Creatinine 1.73 on 7/ 4 Essential hypertension stable    Length of stay, days: 15  Marletta Lor , MD 06/06/2018, 9:31 AM

## 2018-06-06 NOTE — Progress Notes (Signed)
Physical Therapy Session Note  Patient Details  Name: Erika Fry MRN: [id number] Date of Birth: 03-12-1946  Today's Date: 06/06/2018 PT Individual Time: 0805-0915 PT Individual Time Calculation (min): 70 min   Short Term Goals: Week 3:  PT Short Term Goal 1 (Week 3): Pt will perform sit<>stand with max assist of 1 outside of Stedy PT Short Term Goal 2 (Week 3): Pt will initiate gait training  PT Short Term Goal 3 (Week 3): Pt will maintain standing tolerance x 2 min to prepare for ambulation PT Short Term Goal 4 (Week 3): Pt will perform bed mobility with mod assist  PT Short Term Goal 5 (Week 3): Pt will perform bed<>chair transfer with max assist of 1 consistently and LRAD   Skilled Therapeutic Interventions/Progress Updates:   Pt received supine in bed and agreeable to PT. Supine>sit transfer with max assist and max cues for sequencing.  Sitting balance to don pants and shirt with intermittent supervision-min assist from PT. No posterior LOB noted. Sit<>stand in stedy from bed with max assist of 1.   Standing frame 2 minutes x 2 UE support through forearms. BP taken at 0 min 111/65 123/57. Mild increase in back pain in  Standing. No sx of orthostasis.   Sit<>stand in parallel bars x 3 with max assist from PT. Performed pregait training to perform stepping task with UE Support, 2 x 2 BLE in parallel bars with mod-max assist from PT to stabilize contralateral LE in stance.    Patient returned to room and left sitting in [facility] with call bell in reach and all needs met.          Therapy Documentation Precautions:  Precautions Precautions: Fall Precaution Comments: Pacemaker placement 4 weeks ago Required Braces or Orthoses: Spinal Brace Spinal Brace: Lumbar corset Restrictions Weight Bearing Restrictions: No    Vital Signs: Therapy Vitals Temp: 98.2 F (36.8 C) Temp Source: Oral Pulse Rate: (!) 104 Resp: 20 BP: 133/66 Patient Position (if appropriate): Lying Oxygen  Therapy SpO2: 95 % O2 Device: Room Air Pain: Pain Assessment Pain Score: 0-No pain   See Function Navigator for Current Functional Status.   Therapy/Group: Individual Therapy  [name] 06/06/2018, 9:16 AM

## 2018-06-06 NOTE — Progress Notes (Signed)
Physical Therapy Session Note  Patient Details  Name: Erika Fry MRN: [id number] Date of Birth: Jun 26, 1946  Today's Date: 06/06/2018 PT Individual Time: 1300-1345 PT Individual Time Calculation (min): 45 min  Missed time (min): 15 min (pain)  Short Term Goals: Week 3:  PT Short Term Goal 1 (Week 3): Pt will perform sit<>stand with max assist of 1 outside of Stedy PT Short Term Goal 2 (Week 3): Pt will initiate gait training  PT Short Term Goal 3 (Week 3): Pt will maintain standing tolerance x 2 min to prepare for ambulation PT Short Term Goal 4 (Week 3): Pt will perform bed mobility with mod assist  PT Short Term Goal 5 (Week 3): Pt will perform bed<>chair transfer with max assist of 1 consistently and LRAD   Skilled Therapeutic Interventions/Progress Updates:   Pt in supine and reports 10/10 pain in R hip. She was agreeable to attempt OOB activity, she seems very motivated after morning PT session. Session focused on pain management techniques, pt education, and functional mobility. Transferred to EOB w/ max assist x2 at very slow pace 2/2 pain, only able to move a few inches at a time, verbal cues for deep breathing. Reached EOB and required max assist to maintain static sitting balance, heavy posterior lean 2/2 perseveration on pain. After a few minutes, pain continued and pt declined further participation. Transferred back to EOB w/ max assist x2 and therapist assisted pt w/ repositioning in bed for pain tolerance and provided w/ hot pack. Rest break while pt was transferred to different room, then pt requesting assistance w/ using urinal. Provided total assist to use urinal, however pt moving LEs around w/o painful grimaces so therapist could place urinal. Total assist to don new brief while pt performed rolling in each direction w/ max assist. Educated pt on use of heating pad vs hot pack, total assist to place, and cautioned on watching for skin irritation and increased redness. Ended  session in supine, call bell within reach and all needs met. Missed 15 min of skilled PT 2/2 pain.   Therapy Documentation Precautions:  Precautions Precautions: Fall Precaution Comments: Pacemaker placement 4 weeks ago Required Braces or Orthoses: Spinal Brace Spinal Brace: Lumbar corset Restrictions Weight Bearing Restrictions: No General: PT Amount of Missed Time (min): 15 Minutes PT Missed Treatment Reason: Pain Vital Signs: Therapy Vitals Temp: 99.7 F (37.6 C) Temp Source: Oral Pulse Rate: (!) 107 Resp: 16 BP: (!) 151/70 Patient Position (if appropriate): Lying Oxygen Therapy SpO2: 92 % O2 Device: Room Air  See Function Navigator for Current Functional Status.   Therapy/Group: Individual Therapy  Danaya Geddis K Arnette 06/06/2018, 2:00 PM

## 2018-06-07 ENCOUNTER — Inpatient Hospital Stay (HOSPITAL_COMMUNITY): Payer: Medicare Other

## 2018-06-07 DIAGNOSIS — R0602 Shortness of breath: Secondary | ICD-10-CM

## 2018-06-07 LAB — CBC
HCT: 26.1 % — ABNORMAL LOW (ref 36.0–46.0)
Hemoglobin: 8.2 g/dL — ABNORMAL LOW (ref 12.0–15.0)
MCH: 29.6 pg (ref 26.0–34.0)
MCHC: 31.4 g/dL (ref 30.0–36.0)
MCV: 94.2 fL (ref 78.0–100.0)
PLATELETS: 269 10*3/uL (ref 150–400)
RBC: 2.77 MIL/uL — ABNORMAL LOW (ref 3.87–5.11)
RDW: 15.4 % (ref 11.5–15.5)
WBC: 12.3 10*3/uL — AB (ref 4.0–10.5)

## 2018-06-07 LAB — BASIC METABOLIC PANEL
Anion gap: 11 (ref 5–15)
BUN: 40 mg/dL — ABNORMAL HIGH (ref 8–23)
CALCIUM: 9.1 mg/dL (ref 8.9–10.3)
CO2: 27 mmol/L (ref 22–32)
CREATININE: 2.12 mg/dL — AB (ref 0.44–1.00)
Chloride: 97 mmol/L — ABNORMAL LOW (ref 98–111)
GFR calc Af Amer: 25 mL/min — ABNORMAL LOW (ref >60)
GFR, EST NON AFRICAN AMERICAN: 22 mL/min — AB (ref >60)
Glucose, Bld: 123 mg/dL — ABNORMAL HIGH (ref 70–99)
Potassium: 4.4 mmol/L (ref 3.5–5.1)
SODIUM: 135 mmol/L (ref 135–145)

## 2018-06-07 LAB — DIFFERENTIAL
Abs Immature Granulocytes: 0.1 10*3/uL (ref 0.0–0.1)
Basophils Absolute: 0 10*3/uL (ref 0.0–0.1)
Basophils Relative: 0 %
EOS ABS: 0.3 10*3/uL (ref 0.0–0.7)
Eosinophils Relative: 2 %
Immature Granulocytes: 0 %
Lymphocytes Relative: 6 %
Lymphs Abs: 0.7 10*3/uL (ref 0.7–4.0)
MONO ABS: 0.6 10*3/uL (ref 0.1–1.0)
MONOS PCT: 5 %
NEUTROS ABS: 10.2 10*3/uL — AB (ref 1.7–7.7)
NEUTROS PCT: 87 %

## 2018-06-07 LAB — BRAIN NATRIURETIC PEPTIDE: B Natriuretic Peptide: 228.6 pg/mL — ABNORMAL HIGH (ref 0.0–100.0)

## 2018-06-07 LAB — D-DIMER, QUANTITATIVE (NOT AT ARMC): D DIMER QUANT: 14.1 ug{FEU}/mL — AB (ref 0.00–0.50)

## 2018-06-07 LAB — GLUCOSE, CAPILLARY
GLUCOSE-CAPILLARY: 107 mg/dL — AB (ref 70–99)
Glucose-Capillary: 176 mg/dL — ABNORMAL HIGH (ref 70–99)
Glucose-Capillary: 172 mg/dL — ABNORMAL HIGH (ref 70–99)
Glucose-Capillary: 187 mg/dL — ABNORMAL HIGH (ref 70–99)

## 2018-06-07 MED ORDER — PREDNISONE 20 MG PO TABS
60.0000 mg | ORAL_TABLET | Freq: Every day | ORAL | Status: DC
  Administered 2018-06-07 – 2018-06-09 (×3): 60 mg via ORAL
  Filled 2018-06-07 (×3): qty 3

## 2018-06-07 MED ORDER — ALTEPLASE 2 MG IJ SOLR
2.0000 mg | Freq: Once | INTRAMUSCULAR | Status: AC
  Administered 2018-06-07: 2 mg

## 2018-06-07 MED ORDER — FUROSEMIDE 40 MG PO TABS
80.0000 mg | ORAL_TABLET | Freq: Once | ORAL | Status: AC
  Administered 2018-06-07: 80 mg via ORAL
  Filled 2018-06-07: qty 2

## 2018-06-07 MED ORDER — SODIUM CHLORIDE 0.9 % IV SOLN
300.0000 mg | Freq: Two times a day (BID) | INTRAVENOUS | Status: DC
  Administered 2018-06-07 – 2018-06-12 (×10): 300 mg via INTRAVENOUS
  Filled 2018-06-07 (×11): qty 300

## 2018-06-07 NOTE — Progress Notes (Signed)
[facility] for Infectious Disease    Date of Admission:  05/22/2018   Total days of antibiotics 25           ID: Erika Fry is a 72 y.o. female with lumbar discitis on ceftriaxone plus daptomycin on day 25 of 42, with patchy pneumonia Principal Problem:   Diskitis Active Problems:   CKD (chronic kidney disease), stage IV (HCC)   Iron deficiency anemia   Essential hypertension   Gastric polyp   Acute right-sided low back pain with right-sided sciatica   Radicular pain   Acute blood loss anemia   Anemia of chronic disease   Thrombocytopenia ([place])   New onset type 2 diabetes mellitus ([place])   Morbid obesity (HCC)   Hyponatremia   Benign essential HTN   Hypoalbuminemia due to protein-calorie malnutrition (HCC)   Edema of hand   Hyperkalemia   SOB (shortness of breath)   Somnolence   Delirium   Sleep disturbance   Hypertensive crisis   Hypokalemia    Subjective: Over past 5 days having increasing shortness of breath but not untl last night, having documented to have hypoxia. She has noticed having dry cough, sore throat. cxr showing patchy pneumonia. Labs show mild increased leukocytosis of 12.3K plus cr elevated at 2.1 with BNP 228 but had been receiving lasix 80mg  daily. Still has ongoing back pain with working with PT. No diarrhea or fever or chills.  Medications:  . acetaminophen  650 mg Oral TID AC & HS  . allopurinol  200 mg Oral Daily  . amLODipine  10 mg Oral Daily  . Chlorhexidine Gluconate Cloth  6 each Topical Q0600  . ferrous sulfate  325 mg Oral BID WC  . folic acid  1 mg Oral Daily  . gabapentin  100 mg Oral TID  . hydrALAZINE  75 mg Oral TID  . insulin aspart  0-5 Units Subcutaneous QHS  . insulin aspart  0-9 Units Subcutaneous TID WC  . insulin glargine  20 Units Subcutaneous Daily  . levothyroxine  75 mcg Oral QAC breakfast  . lidocaine  1 patch Transdermal Q24H  . loratadine  10 mg Oral Daily  . Melatonin  1.5 mg Oral QHS  .  multivitamin with minerals   Oral Daily  . oxyCODONE  5 mg Oral BID WC  . pantoprazole  40 mg Oral Q0600  . protein supplement shake  2 oz Oral TID  . senna  1 tablet Oral Daily  . simvastatin  40 mg Oral QHS  . vitamin B-12  1,000 mcg Oral Daily    Objective: Vital signs in last 24 hours: Temp:  [97.6 F (36.4 C)-99.7 F (37.6 C)] 97.6 F (36.4 C) (07/07 0311) Pulse Rate:  [93-107] 99 (07/07 0311) Resp:  [16-22] 20 (07/07 0311) BP: (127-151)/(64-70) 130/65 (07/07 0311) SpO2:  [89 %-96 %] 96 % (07/07 0311) Weight:  [205 lb 14.6 oz (93.4 kg)] 205 lb 14.6 oz (93.4 kg) (07/07 0532)   Physical Exam  Constitutional:  oriented to person, place, and time. appears well-developed and well-nourished. No distress.  HENT: [place]/AT, PERRLA, no scleral icterus Mouth/Throat: Oropharynx is clear and moist. No oropharyngeal exudate.  Cardiovascular: Normal rate, regular rhythm and normal heart sounds. Exam reveals no gallop and no friction rub.  No murmur heard.  Chest wall = right sided central catheter well dressed no erythema. Pulmonary/Chest: Effort normal and breath sounds normal. No respiratory distress.  has no wheezes.  Neck = supple,  no nuchal rigidity Abdominal: Soft. Bowel sounds are normal.  exhibits no distension. There is no tenderness.  Lymphadenopathy: no cervical adenopathy. No axillary adenopathy Neurological: alert and oriented to person, place, and time.  Ext: trace edema lower extremities Skin: Skin is warm and dry. No rash noted. No erythema.  Psychiatric: a normal mood and affect.  behavior is normal.    Lab Results Recent Labs    06/07/18 0227  WBC 12.3*  HGB 8.2*  HCT 26.1*  NA 135  K 4.4  CL 97*  CO2 27  BUN 40*  CREATININE 2.12*    Microbiology: 6/29 blood cx ngtd Studies/Results: Dg Chest Port 1 View  Result Date: 06/07/2018 CLINICAL DATA:  Increased shortness of breath EXAM: PORTABLE CHEST 1 VIEW COMPARISON:  05/28/2018, 03/26/2018 FINDINGS:  Right-sided central venous catheter tip projects over the low right atrium. Left-sided pacing device as before. Cardiomegaly. Small moderate left pleural effusion and small right pleural effusion. New foci of airspace disease at the right apex and right base with worsening consolidation at the left base. Vascular congestion and mild edema. No pneumothorax. IMPRESSION: 1. Right-sided central venous catheter tip overlies the right atrium 2. Development of multifocal airspace disease in the right apex and bilateral left greater than right lung bases suspicious for multifocal pneumonia 3. Cardiomegaly with vascular congestion and mild edema. Small moderate left effusion and trace right pleural effusion Electronically Signed   By: Donavan Foil M.D.   On: 06/07/2018 02:52     Assessment/Plan: Pneumonia = has evidence of patchy infiltrate some of which might be accentuated by mild CHF. Though this would not explain leukocytosis. 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 Recommend to stop daptomycin. Start on steroids burst, would start pred 60mg  daily to see if it helps symptoms. If this is eosinophilic pneumonia, should see improvement quite quickly.   Please get sputum culture if she can produce any sputum  Discitis = currently on ceftriaxone. We will change to ceftaroline temporarily to provide mrsa coverage and avoid usig vancomycin due to ckd.   Dr Megan Salon to see tomorrow.   [facility] Cell: [phone] Pager: [phone]  06/07/2018, 12:38 PM

## 2018-06-07 NOTE — Progress Notes (Signed)
Physical Therapy Session Note  Patient Details  Name: Erika Fry MRN: [id number] Date of Birth: [birth year]  Today's Date: 06/07/2018 PT Individual Time: 1120-1200 PT Individual Time Calculation (min): 40 min  and Today's Date: 06/07/2018 PT Missed Time: 20 Minutes Missed Time Reason: Unavailable (Comment)(off unit for dopplers)  Short Term Goals: Week 3:  PT Short Term Goal 1 (Week 3): Pt will perform sit<>stand with max assist of 1 outside of Stedy PT Short Term Goal 2 (Week 3): Pt will initiate gait training  PT Short Term Goal 3 (Week 3): Pt will maintain standing tolerance x 2 min to prepare for ambulation PT Short Term Goal 4 (Week 3): Pt will perform bed mobility with mod assist  PT Short Term Goal 5 (Week 3): Pt will perform bed<>chair transfer with max assist of 1 consistently and LRAD   Skilled Therapeutic Interventions/Progress Updates:    Pt missed 20 minutes of skilled therapy tx this session, off unit for dopplers. Therapist to check back later this AM.   Pt supine in bed upon PT return, agreeable to therapy tx and reports pain 8/10. Pt performed rolling in both directions with max assist and verbal cues for techniques/back precautions, therapist donned pants with max assist. Pt transferred from supine>L sidelying>sitting EOB with max assist and verbal cues for techniques, pt requiring mod-max assist for sitting balance secondary to L lateral lean, pt reports it is too painful to lean R/weightbear on R hip. LSO donned total assist. Pt performed sit<>stand within stedy with max assist +2, unable to come fully up to standing position. Pt attempted again with max assist +2, verbal cues for upright postural and techniques. Pt performed x2 sit<>stands from elevated seat within stedy, max assist with report of pain now 10/10. Pt requests to lay in bed at end of session, declines staying OOB in TIS w/c. Pt transferred back to supine with max assist. Pt left supine in bed with needs in  reach and bed alarm set. Therapist notified RN for pain medicine per pt request.   Therapy Documentation Precautions:  Precautions Precautions: Fall Precaution Comments: Pacemaker placement 4 weeks ago Required Braces or Orthoses: Spinal Brace Spinal Brace: Lumbar corset Restrictions Weight Bearing Restrictions: No   See Function Navigator for Current Functional Status.   Therapy/Group: Individual Therapy  Netta Corrigan, PT, DPT 06/07/2018, 7:51 AM

## 2018-06-07 NOTE — Progress Notes (Signed)
Labs drawn by IV team member, portable chest x-ray done, and lasix 80 mg tablet given. Will continue to monitor.

## 2018-06-07 NOTE — Progress Notes (Signed)
Patient ID: Erika Fry, female   DOB: 10-20-1946, 72 y.o.   MRN: [id number]   Erika Fry is a 72 y.o. female admitted for CIR with functional deficits secondary to lumbar osteomyelitis/discitis.  Last night the patient became slightly more short of breath with mild hypoxemia.  She states that she has been slightly short of breath for a few days but this intensified last night.  Denies any cough fever or chills.  A chest x-ray was obtained that revealed multifocal airspace disease in the right apex in both bases consistent with pneumonia.  WBC count increased to 12.3 without eosinophilia creatinine also increased to 2.12. Discussed with ID who recommended discontinuation of daptomycin.  They will evaluate today and consider alternative antibiotic therapy.  The patient does have a central PICC line in place   Subjective: Complaining of increasing weakness and shortness of breath.  Now on supplemental nasal cannula O2.  Denies any cough fever or chills  Past Medical History:  Diagnosis Date  . Arthritis    "knees, thumbs" (03/25/2018)  . CKD (chronic kidney disease), stage IV ([place])   . Diet-controlled diabetes mellitus ([place])   . GERD (gastroesophageal reflux disease)   . Gout    "on daily RX" (03/25/2018)  . Heart murmur   . High cholesterol   . Hypertension   . Hypothyroidism   . Iron deficiency anemia    "had to get an iron infusion"  . Migraine    "used to have them growing up" (03/25/2018)  . Presence of permanent cardiac pacemaker 03/25/2018     Objective: Vital signs in last 24 hours: Temp:  [97.6 F (36.4 C)-99.7 F (37.6 C)] 97.6 F (36.4 C) (07/07 0311) Pulse Rate:  [93-107] 99 (07/07 0311) Resp:  [16-22] 20 (07/07 0311) BP: (127-151)/(64-70) 130/65 (07/07 0311) SpO2:  [89 %-96 %] 96 % (07/07 0311) Weight:  [205 lb 14.6 oz (93.4 kg)] 205 lb 14.6 oz (93.4 kg) (07/07 0532) Weight change: 1 lb 8.7 oz (0.7 kg) Last BM Date: 06/05/18  Intake/Output from  previous day: 07/06 0701 - 07/07 0700 In: 955 [P.O.:840; IV Piggyback:115] Out: 1000 [Urine:1000] Last cbgs: CBG (last 3)  Recent Labs    06/06/18 1642 06/06/18 2123 06/07/18 0629  GLUCAP 139* 129* 107*   Patient Vitals for the past 24 hrs:  BP Temp Temp src Pulse Resp SpO2 Weight  06/07/18 0532 - - - - - - 205 lb 14.6 oz (93.4 kg)  06/07/18 0311 130/65 97.6 F (36.4 C) Oral 99 20 96 % -  06/07/18 0224 - - - 93 18 96 % -  06/07/18 0207 - - - - - 93 % -  06/07/18 0200 127/64 - - 95 (!) 22 (!) 89 % -  06/06/18 1952 133/69 98.3 F (36.8 C) Oral 100 18 93 % -  06/06/18 1256 (!) 151/70 99.7 F (37.6 C) Oral (!) 107 16 92 % -    Physical Exam General: No apparent distress;  appears slightly weak HEENT: not dry Lungs: Normal effort. Lungs clear to auscultation, no crackles or wheezes. Cardiovascular: Regular rate and rhythm, no edema; systolic murmur decreased; heart rate decreased compared to yesterday morning Abdomen: S/NT/ND; BS(+) Musculoskeletal:  unchanged Neurological: No new neurological deficits Extremities.  SCDs in place Skin: clear; no rash Mental state: Alert, oriented, cooperative    Lab Results: BMET    Component Value Date/Time   NA 135 06/07/2018 0227   K 4.4 06/07/2018 0227   CL 97 (L)  06/07/2018 0227   CO2 27 06/07/2018 0227   GLUCOSE 123 (H) 06/07/2018 0227   BUN 40 (H) 06/07/2018 0227   CREATININE 2.12 (H) 06/07/2018 0227   CALCIUM 9.1 06/07/2018 0227   CALCIUM 9.6 10/31/2017 1050   GFRNONAA 22 (L) 06/07/2018 0227   GFRAA 25 (L) 06/07/2018 0227   CBC    Component Value Date/Time   WBC 12.3 (H) 06/07/2018 0227   RBC 2.77 (L) 06/07/2018 0227   HGB 8.2 (L) 06/07/2018 0227   HCT 26.1 (L) 06/07/2018 0227   HCT 20.1 (L) 05/13/2018 0427   PLT 269 06/07/2018 0227   MCV 94.2 06/07/2018 0227   MCH 29.6 06/07/2018 0227   MCHC 31.4 06/07/2018 0227   RDW 15.4 06/07/2018 0227   LYMPHSABS 0.6 (L) 06/02/2018 0421   MONOABS 0.6 06/02/2018 0421    EOSABS 0.2 06/02/2018 0421   BASOSABS 0.0 06/02/2018 0421    Studies/Results: Dg Chest Port 1 View  Result Date: 06/07/2018 CLINICAL DATA:  Increased shortness of breath EXAM: PORTABLE CHEST 1 VIEW COMPARISON:  05/28/2018, 03/26/2018 FINDINGS: Right-sided central venous catheter tip projects over the low right atrium. Left-sided pacing device as before. Cardiomegaly. Small moderate left pleural effusion and small right pleural effusion. New foci of airspace disease at the right apex and right base with worsening consolidation at the left base. Vascular congestion and mild edema. No pneumothorax. IMPRESSION: 1. Right-sided central venous catheter tip overlies the right atrium 2. Development of multifocal airspace disease in the right apex and bilateral left greater than right lung bases suspicious for multifocal pneumonia 3. Cardiomegaly with vascular congestion and mild edema. Small moderate left effusion and trace right pleural effusion Electronically Signed   By: Donavan Foil M.D.   On: 06/07/2018 02:52    Medications: I have reviewed the patient's current medications.  Assessment/Plan:  Functional deficits secondary to lumbar discitis.  Continue CIR Multifocal airspace disease consistent with pneumonia.  ID to consult. Chronic kidney disease.  Increased creatinine to 2.13.  Will discontinue furosemide and supplemental potassium DVT prophylaxis.  Continue SCDs Type 2 diabetes mellitus.  Good glycemic control Essential hypertension.  Stable    Length of stay, days: 16  Erika Fry , MD 06/07/2018, 8:37 AM

## 2018-06-07 NOTE — Progress Notes (Signed)
Bilateral lower extremity venous duplex completed, Therer is no evidence of a DVT or Baker's cyst. There is no change from study of 05/2018. [facility], [place] 06/07/2018 10:29 am

## 2018-06-07 NOTE — Progress Notes (Signed)
Patient complained of shortness of breath; denies pain except for back pain when moved. Vital signs checked. Noted oxygen saturation was 86-91 on room air. Flonase administered without relief. Lung auscultation with fine rales at bilateral bases. Charge RN informed. Dr. Burnice Logan called with  orders. Oxygen administered at 2L/ [place] now saturating from 94-97%.

## 2018-06-07 NOTE — Progress Notes (Signed)
Patient complains of unable to breath 02 saturation 94% at 2L/min; RR 14 HR 104.Patient anxious. Advised patient to take deep breaths and to relax. Continued to monitor.

## 2018-06-07 NOTE — Progress Notes (Signed)
Occupational Therapy Session Note  Patient Details  Name: DEBIE ASHLINE MRN: [id number] Date of Birth: 04/04/1946  Today's Date: 06/07/2018 OT Individual Time: 1300-1415 OT Individual Time Calculation (min): 75 min    Short Term Goals: Week 2:  OT Short Term Goal 1 (Week 2): Pt will move from sidelying to sit with mod A of 1 to prepare for bathing/dressing EOB or in wc. OT Short Term Goal 2 (Week 2): Pt will maintain static sitting EOB with min A for 5-10 min. OT Short Term Goal 3 (Week 2): Pt will don shirt with min A. OT Short Term Goal 4 (Week 2): Pt will sit to stand in bariatric Stedy with mod A of 1 to prepare for transfer to w/c.  Skilled Therapeutic Interventions/Progress Updates:    1:1. Pt asleep upon arrival with increased time to arouse. Pt completes rolling B MAX A to don new pants total A +2 for time management. Pt supine>sitting EOB with total A with mod A to maintain sitting balance EOB when donning brace d/t L lean. Pt completes sit to stand in stedy throughout session MAX A of 1-2 to transfer EOB<>w/c. Pt unable to achieve upright posture in stedy standing or seated on flaps despite tactile cueing. Pt sits in TIS to eat lunch while OT installs elastic laces in shoes. Family brings new pants that are looser and pt sit to stand in stedy to change pants as stated above. Pt transfers back to bed upon request, despite encouragement to sit in TIS. OT issued cervical AROM HEP and reviewed with pt/family to strengthen muscles to hold head up against gravity. Exite dsession with pt seated in bed, call lgith in reach and RN in room.   Therapy Documentation Precautions:  Precautions Precautions: Fall Precaution Comments: Pacemaker placement 4 weeks ago Required Braces or Orthoses: Spinal Brace Spinal Brace: Lumbar corset Restrictions Weight Bearing Restrictions: No  See Function Navigator for Current Functional Status.   Therapy/Group: Individual Therapy  Tonny Branch 06/07/2018, 5:10 PM

## 2018-06-08 ENCOUNTER — Inpatient Hospital Stay (HOSPITAL_COMMUNITY): Payer: Medicare Other | Admitting: Occupational Therapy

## 2018-06-08 ENCOUNTER — Inpatient Hospital Stay (HOSPITAL_COMMUNITY): Payer: Medicare Other

## 2018-06-08 DIAGNOSIS — I441 Atrioventricular block, second degree: Secondary | ICD-10-CM

## 2018-06-08 DIAGNOSIS — E1122 Type 2 diabetes mellitus with diabetic chronic kidney disease: Secondary | ICD-10-CM

## 2018-06-08 DIAGNOSIS — Z950 Presence of cardiac pacemaker: Secondary | ICD-10-CM

## 2018-06-08 DIAGNOSIS — Z885 Allergy status to narcotic agent status: Secondary | ICD-10-CM

## 2018-06-08 DIAGNOSIS — J189 Pneumonia, unspecified organism: Secondary | ICD-10-CM | POA: Diagnosis not present

## 2018-06-08 LAB — GLUCOSE, CAPILLARY
GLUCOSE-CAPILLARY: 215 mg/dL — AB (ref 70–99)
GLUCOSE-CAPILLARY: 303 mg/dL — AB (ref 70–99)
Glucose-Capillary: 254 mg/dL — ABNORMAL HIGH (ref 70–99)
Glucose-Capillary: 235 mg/dL — ABNORMAL HIGH (ref 70–99)

## 2018-06-08 LAB — BASIC METABOLIC PANEL
Anion gap: 9 (ref 5–15)
BUN: 49 mg/dL — AB (ref 8–23)
CALCIUM: 9.2 mg/dL (ref 8.9–10.3)
CO2: 29 mmol/L (ref 22–32)
CREATININE: 2.26 mg/dL — AB (ref 0.44–1.00)
Chloride: 98 mmol/L (ref 98–111)
GFR calc Af Amer: 23 mL/min — ABNORMAL LOW (ref >60)
GFR calc non Af Amer: 20 mL/min — ABNORMAL LOW (ref >60)
GLUCOSE: 233 mg/dL — AB (ref 70–99)
Potassium: 4.8 mmol/L (ref 3.5–5.1)
Sodium: 136 mmol/L (ref 135–145)

## 2018-06-08 LAB — CBC WITH DIFFERENTIAL/PLATELET
BASOS PCT: 0 %
Basophils Absolute: 0 10*3/uL (ref 0.0–0.1)
Eosinophils Absolute: 0 10*3/uL (ref 0.0–0.7)
Eosinophils Relative: 0 %
HCT: 26.5 % — ABNORMAL LOW (ref 36.0–46.0)
HEMOGLOBIN: 8.2 g/dL — AB (ref 12.0–15.0)
LYMPHS ABS: 0.6 10*3/uL — AB (ref 0.7–4.0)
LYMPHS PCT: 5 %
MCH: 29 pg (ref 26.0–34.0)
MCHC: 30.9 g/dL (ref 30.0–36.0)
MCV: 93.6 fL (ref 78.0–100.0)
MONO ABS: 0.1 10*3/uL (ref 0.1–1.0)
Monocytes Relative: 1 %
NEUTROS ABS: 11.9 10*3/uL — AB (ref 1.7–7.7)
Neutrophils Relative %: 94 %
Platelets: 319 10*3/uL (ref 150–400)
RBC: 2.83 MIL/uL — ABNORMAL LOW (ref 3.87–5.11)
RDW: 15.5 % (ref 11.5–15.5)
WBC: 12.6 10*3/uL — ABNORMAL HIGH (ref 4.0–10.5)

## 2018-06-08 MED ORDER — SODIUM CHLORIDE 0.9 % IV SOLN
Freq: Once | INTRAVENOUS | Status: AC
  Administered 2018-06-08: 09:00:00 via INTRAVENOUS

## 2018-06-08 MED ORDER — SODIUM CHLORIDE 0.9 % IV SOLN
Freq: Once | INTRAVENOUS | Status: AC
  Administered 2018-06-09: 09:00:00 via INTRAVENOUS

## 2018-06-08 MED ORDER — SODIUM CHLORIDE 0.9 % IV SOLN: Freq: Once | INTRAVENOUS | Status: DC

## 2018-06-08 NOTE — Progress Notes (Signed)
[facility]  Date of Admission:  05/22/2018   Total days of antibiotics 26        Ceftaroline Day 2        Daptomycin Day 18 (stopped on 7/6)        Ceftriaxone Day 25 (stopped on 7/7)          ASSESSMENT: Ms. Louks is a 72 year old female who presented with lumbar discitis and is now presenting with shortness of breath and patchy pneumonia on chest xray. She was being treated on ceftriaxone (day 25) plus daptomycin (day 18) for her lumbar discitis. She noticed increasing SOB over the last six days and was found to be hypoxemic (SpO2 89) on 06/07/18. A chest xray on 06/07/18 showed: Development of multifocal airspace disease in the right apex and bilateral left greater than right lung bases suspicious for multifocal pneumonia.   She has been on daptomycin which is associated with eosinophilic pneumonia so we stopped daptomycin and started steroid therapy. If this is eosinophilic pneumonia, should see improvement quickly on steroids. She was also on ceftriaxone for her discitis but we switched her to ceftaroline temporarily to provide MRSA coverage. We are avoiding use of vancomycin due to her CKD.  PLAN: 1. Continue to monitor on steroid and ceftaroline therapy 2. Collect sputum culture if she can produce sputum 3. If prolonged ceftaroline treatment required discuss payment options with advanced home care     Principal Problem:   HCAP (healthcare-associated pneumonia) Active Problems:   CKD (chronic kidney disease), stage IV (HCC)   Iron deficiency anemia   Essential hypertension   Lumbar discitis   Gastric polyp   Diskitis   Acute right-sided low back pain with right-sided sciatica   Radicular pain   Acute blood loss anemia   Anemia of chronic disease   Thrombocytopenia ([place])   New onset type 2 diabetes mellitus ([place])   Morbid obesity (HCC)   Hyponatremia   Benign essential HTN   Hypoalbuminemia due to protein-calorie malnutrition (HCC)  Edema of hand   Hyperkalemia   SOB (shortness of breath)   Somnolence   Delirium   Sleep disturbance   Hypertensive crisis   Hypokalemia   Scheduled Meds: . acetaminophen  650 mg Oral TID AC & HS  . allopurinol  200 mg Oral Daily  . amLODipine  10 mg Oral Daily  . Chlorhexidine Gluconate Cloth  6 each Topical Q0600  . ferrous sulfate  325 mg Oral BID WC  . folic acid  1 mg Oral Daily  . gabapentin  100 mg Oral TID  . hydrALAZINE  75 mg Oral TID  . insulin aspart  0-5 Units Subcutaneous QHS  . insulin aspart  0-9 Units Subcutaneous TID WC  . insulin glargine  20 Units Subcutaneous Daily  . levothyroxine  75 mcg Oral QAC breakfast  . lidocaine  1 patch Transdermal Q24H  . loratadine  10 mg Oral Daily  . Melatonin  1.5 mg Oral QHS  . multivitamin with minerals   Oral Daily  . oxyCODONE  5 mg Oral BID WC  . pantoprazole  40 mg Oral Q0600  . predniSONE  60 mg Oral Q breakfast  . protein supplement shake  2 oz Oral TID  . senna  1 tablet Oral Daily  . simvastatin  40 mg Oral QHS  . vitamin B-12  1,000 mcg Oral Daily   Continuous Infusions: . sodium chloride    .  ceFTAROline (TEFLARO) IV 250 mL/hr at 06/08/18 0955   PRN Meds:.acetaminophen, acetaminophen, alum & mag hydroxide-simeth, bisacodyl, diphenhydrAMINE, fluticasone, guaiFENesin-dextromethorphan, naLOXone (NARCAN)  injection, oxyCODONE, polyethylene glycol, prochlorperazine **OR** prochlorperazine **OR** prochlorperazine, sodium chloride flush, sodium phosphate, traMADol, traZODone   SUBJECTIVE: Levonne Carreras is a 72 y.o. female with a history of DM, Stage V CKD, recent pacemaker pacemaker placement who had a recent admission from 5/29-6/4 for an acute onset of severe low back pain radiating into her right hip and leg s/p a fall. Her pacemaker was placed for symptomatic type II heart block and bradycardia so an MRI was not done. Given her history of CKD, they did not do a CT with contrast either.  She was treated with  steroids and discharged to a skilled nursing facility.  She was readmitted on 05/12/2018 due to persistent pain, anemia and constipation.  Lumbar MRI was done which revealed L4-5 discitis with 11 x 19 mm paraspinous fluid collection.  She was started on empiric antibiotics.  Her lumbar aspirate was delayed until 05/15/2018.  4 cc of thick bloody fluid was aspirated. No organisms were seen on Gram stain.  Cultures were negative. She was started on vancomycin and zoysin and then her therapy was tailored to IV ceftriaxone and renally dosed daptomycin for 6 weeks.   Ms. Schmuhl reported shortness of breath for the past six days and was found to have hypoxemia on 06/07/18. This was followed up with a chest xray that showed development of multifocal airspace disease in the right apex and bilateral left greater than right lung bases suspicious for multifocal pneumonia. Labs show mild leukocytosis of 12.6, and an elevated creatinine at 2.26. Daptomycin treatment was stopped 06/06/18 due to suspicion for eosinophilic pneumonia. She was started on steroids and ceftriaxone was switched to ceftaroline for MRSA coverage.   Today, Ms. Ellis was seen and evaluated at her bedside. She was alert and oriented x3 talking to her daughter in law on the phone upon arrival. She stated she feels better but is still having SOB and a dry cough. She denied any chest pain, sputum production, ear pain, or headaches. She is on a nasal cannula and said that has helped. She said her back pain is still present and has not improved but she is able to bear some weight when using a machine for physical therapy. She did state she is making improvements in physical therapy.    Review of Systems: Review of Systems  Constitutional: Negative for chills and fever.  HENT: Negative for ear pain.   Respiratory: Positive for cough and shortness of breath. Negative for sputum production.   Cardiovascular: Negative for chest pain.  Gastrointestinal:  Negative for abdominal pain.  Musculoskeletal: Positive for back pain.  Neurological: Negative for headaches.    Allergies  Allergen Reactions  . Codeine Other (See Comments)    Increases Pain and couldn't sleep    OBJECTIVE: Vitals:   06/07/18 0532 06/07/18 1327 06/07/18 1945 06/08/18 0424  BP:  134/68 130/63 131/62  Pulse:  (!) 108 (!) 103 84  Resp:  19 18 18   Temp:  98.9 F (37.2 C) 99 F (37.2 C) (!) 97.5 F (36.4 C)  TempSrc:  Oral Oral Oral  SpO2:  98% 93% 98%  Weight: 93.4 kg (205 lb 14.6 oz)   93.2 kg (205 lb 6.4 oz)  Height:       Body mass index is 34.18 kg/m.  Physical Exam  Constitutional: She is oriented to person, place, and  time.  Cardiovascular: Normal rate, regular rhythm and normal heart sounds.  Pulmonary/Chest: Effort normal and breath sounds normal. No respiratory distress.  Abdominal: Soft. Bowel sounds are normal.  Neurological: She is alert and oriented to person, place, and time.  Psychiatric: She has a normal mood and affect. Her behavior is normal. Judgment and thought content normal.    Lab Results Lab Results  Component Value Date   WBC 12.6 (H) 06/08/2018   HGB 8.2 (L) 06/08/2018   HCT 26.5 (L) 06/08/2018   MCV 93.6 06/08/2018   PLT 319 06/08/2018    Lab Results  Component Value Date   CREATININE 2.26 (H) 06/08/2018   BUN 49 (H) 06/08/2018   NA 136 06/08/2018   K 4.8 06/08/2018   CL 98 06/08/2018   CO2 29 06/08/2018    Lab Results  Component Value Date   ALT 35 05/29/2018   AST 77 (H) 05/29/2018   ALKPHOS 102 05/29/2018   BILITOT 0.6 05/29/2018     Microbiology: Recent Results (from the past 240 hour(s))  Culture, blood (routine x 2)     Status: None   Collection Time: 05/30/18  6:43 PM  Result Value Ref Range Status   Specimen Description BLOOD RIGHT ARM  Final   Special Requests   Final    BOTTLES DRAWN AEROBIC AND ANAEROBIC Blood Culture results may not be optimal due to an inadequate volume of blood received in  culture bottles   Culture   Final    NO GROWTH 5 DAYS Performed at [facility], [place] [address], [place], [place] [address]    Report Status 06/04/2018 FINAL  Final  Culture, blood (routine x 2)     Status: None   Collection Time: 05/30/18  6:53 PM  Result Value Ref Range Status   Specimen Description BLOOD RIGHT WRIST  Final   Special Requests   Final    BOTTLES DRAWN AEROBIC AND ANAEROBIC Blood Culture results may not be optimal due to an inadequate volume of blood received in culture bottles   Culture   Final    NO GROWTH 5 DAYS Performed at [facility], [place] [address], [place], [place] [address]    Report Status 06/04/2018 FINAL  Final    Ereka Brau Dutch Quint, [place] [facility]  06/08/2018, 11:53 AM

## 2018-06-08 NOTE — Progress Notes (Signed)
Occupational Therapy Session Note  Patient Details  Name: Erika Fry MRN: [id number] Date of Birth: [birth year]  Today's Date: 06/08/2018 OT Individual Time: 1100-1155 and 1330-1435 OT Individual Time Calculation (min): 55 min and 65 min   Short Term Goals: Week 2:  OT Short Term Goal 1 (Week 2): Pt will move from sidelying to sit with mod A of 1 to prepare for bathing/dressing EOB or in wc. OT Short Term Goal 2 (Week 2): Pt will maintain static sitting EOB with min A for 5-10 min. OT Short Term Goal 3 (Week 2): Pt will don shirt with min A. OT Short Term Goal 4 (Week 2): Pt will sit to stand in bariatric Stedy with mod A of 1 to prepare for transfer to w/c.  Skilled Therapeutic Interventions/Progress Updates:    Session One: Pt seen for OT session focusing on ADL re-training, activity tolerance, and functional transfers. Pt sitting in tilt-in-space w/c upon arrival, agreeable to tx session. Voiced 8/10 pain at rest and 10/10 pain with movement, however, no physical signs of distress. RN made aware and medication administered at beginning of session.  She completed grooming tasks from w/c level at sink, lots of encouragement and time required for pt to initiate anterior weight shift in order to obtain items and manipulate sink controls. Pt voiced "feeling like myself again" after make up donned and teeth brushed. She desired to return to bed at end of session. Completed Beasy board transfer w/c> EOB, mod A +1 with +2 for safety and to place and stabilize equipment. Will trial standard sliding board during next session due to pt progress.  She required 1 UE support while seated EOB. Completed sit>stand in STEDY from highly elevated EOB. Required mod A +1, however, heavy reliance on UEs and unable to obtain erect posture once standing in STEDY. Significant rest break required before requiring mod+2 to stand and then return to EOB.  Pt refused eating lunch sitting EOB in order to promote  upright tolerance and aid digestion. Therefore, returned to supine with mod A. Pt left sitting up in bed, set-up with meal tray and all needs in reach.   Session Two: Pt seen for OT session focusing on functional sitting balance/endruance and functional transfers. Pt in supine upon arrival, agreeable to tx session. Pt transferred to sitting EOB with assist to advance B LEs off EOB and hospital bed functions to assist with elevating trunk with VCs for technique. Pt able to maintain static sitting balance EOB ~7 minutes, alternating UE support though demonstrated ability to maintain balance without need for UE support.  Completed sliding board transfer EOB>tilt-in-space w/c throughout session with min fading to mod A with fatigue. Pt demonstrates ability to effectivly anteriorly shift for effective head/hip relationship in order to advance hips during transfer. VCs provided throughout for technique and assist to place and steady equipment.  In therapy gym, transferred to sitting on EOM. Completed ball toss activity and then ball hitting activity using #1 dowel rod in order to address dynamic sitting balance and functional endurance. Throughout session including seated activities and during transfers, pt required significantly increased time and rest breaks required throughout due to decreased functional activity tolerance. Pt on 2: supplemental O2 throughout session with VCs provided throughout for deep breathing techniques. Pt returned to room at end of session, encouraged to stay sitting up in w/c as long as able to tolerate in order to increase OOB tolerance. Pt left with all needs in reach.   Therapy Documentation  Precautions:  Precautions Precautions: Fall Precaution Comments: Pacemaker placement 4 weeks ago Required Braces or Orthoses: Spinal Brace Spinal Brace: Lumbar corset Restrictions Weight Bearing Restrictions: No ADL: ADL ADL Comments: See functional navigator  See Function Navigator  for Current Functional Status.   Therapy/Group: Individual Therapy  Arelene Moroni L 06/08/2018, 7:13 AM

## 2018-06-08 NOTE — Progress Notes (Signed)
Subjective/Complaints: Patient seen sitting up in bed this morning. She states she slept well overnight. She states over the weekend she was not able to breathe and her oxygen sats were noted to be low. She states she was started on nasal cannula feels better afterward.  ROS: Denies CP, S OB, nausea, vomiting diarrhea.  Objective: Vital Signs: Blood pressure 131/62, pulse 84, temperature (!) 97.5 F (36.4 C), temperature source Oral, resp. rate 18, height _0  (1.651 m), weight 93.2 kg (205 lb 6.4 oz), SpO2 98 %. Dg Chest Port 1 View  Result Date: 06/07/2018 CLINICAL DATA:  Increased shortness of breath EXAM: PORTABLE CHEST 1 VIEW COMPARISON:  05/28/2018, 03/26/2018 FINDINGS: Right-sided central venous catheter tip projects over the low right atrium. Left-sided pacing device as before. Cardiomegaly. Small moderate left pleural effusion and small right pleural effusion. New foci of airspace disease at the right apex and right base with worsening consolidation at the left base. Vascular congestion and mild edema. No pneumothorax. IMPRESSION: 1. Right-sided central venous catheter tip overlies the right atrium 2. Development of multifocal airspace disease in the right apex and bilateral left greater than right lung bases suspicious for multifocal pneumonia 3. Cardiomegaly with vascular congestion and mild edema. Small moderate left effusion and trace right pleural effusion Electronically Signed   By: Donavan Foil M.D.   On: 06/07/2018 02:52   Results for orders placed or performed during the hospital encounter of 05/22/18 (from the past 72 hour(s))  Glucose, capillary     Status: Abnormal   Collection Time: 06/05/18 12:17 PM  Result Value Ref Range   Glucose-Capillary 136 (H) 70 - 99 mg/dL  Glucose, capillary     Status: Abnormal   Collection Time: 06/05/18  4:45 PM  Result Value Ref Range   Glucose-Capillary 182 (H) 70 - 99 mg/dL  Glucose, capillary     Status: Abnormal   Collection Time:  06/05/18  9:29 PM  Result Value Ref Range   Glucose-Capillary 184 (H) 70 - 99 mg/dL  Glucose, capillary     Status: Abnormal   Collection Time: 06/06/18  6:51 AM  Result Value Ref Range   Glucose-Capillary 124 (H) 70 - 99 mg/dL   Comment 1 Notify RN   Glucose, capillary     Status: Abnormal   Collection Time: 06/06/18 11:43 AM  Result Value Ref Range   Glucose-Capillary 138 (H) 70 - 99 mg/dL  Glucose, capillary     Status: Abnormal   Collection Time: 06/06/18  4:42 PM  Result Value Ref Range   Glucose-Capillary 139 (H) 70 - 99 mg/dL  Glucose, capillary     Status: Abnormal   Collection Time: 06/06/18  9:23 PM  Result Value Ref Range   Glucose-Capillary 129 (H) 70 - 99 mg/dL  Basic metabolic panel     Status: Abnormal   Collection Time: 06/07/18  2:27 AM  Result Value Ref Range   Sodium 135 135 - 145 mmol/L   Potassium 4.4 3.5 - 5.1 mmol/L   Chloride 97 (L) 98 - 111 mmol/L    Comment: Please note change in reference range.   CO2 27 22 - 32 mmol/L   Glucose, Bld 123 (H) 70 - 99 mg/dL    Comment: Please note change in reference range.   BUN 40 (H) 8 - 23 mg/dL    Comment: Please note change in reference range.   Creatinine, Ser 2.12 (H) 0.44 - 1.00 mg/dL   Calcium 9.1 8.9 - 10.3 mg/dL  GFR calc non Af Amer 22 (L) >60 mL/min   GFR calc Af Amer 25 (L) >60 mL/min    Comment: (NOTE) The eGFR has been calculated using the CKD EPI equation. This calculation has not been validated in all clinical situations. eGFR's persistently <60 mL/min signify possible Chronic Kidney Disease.    Anion gap 11 5 - 15    Comment: Performed at [facility] [address] [place], [place] [address]  CBC     Status: Abnormal   Collection Time: 06/07/18  2:27 AM  Result Value Ref Range   WBC 12.3 (H) 4.0 - 10.5 K/uL   RBC 2.77 (L) 3.87 - 5.11 MIL/uL   Hemoglobin 8.2 (L) 12.0 - 15.0 g/dL   HCT 26.1 (L) 36.0 - 46.0 %   MCV 94.2 78.0 - 100.0 fL   MCH 29.6 26.0 - 34.0 pg   MCHC 31.4 30.0 -  36.0 g/dL   RDW 15.4 11.5 - 15.5 %   Platelets 269 150 - 400 K/uL    Comment: Performed at [facility], [place] [address], [place], [place] [address]  D-dimer, quantitative (not at [facility])     Status: Abnormal   Collection Time: 06/07/18  2:27 AM  Result Value Ref Range   D-Dimer, Quant 14.10 (H) 0.00 - 0.50 ug/mL-FEU    Comment: (NOTE) At the manufacturer cut-off of 0.50 ug/mL FEU, this assay has been documented to exclude PE with a sensitivity and negative predictive value of 97 to 99%.  At this time, this assay has not been approved by the FDA to exclude DVT/VTE. Results should be correlated with clinical presentation. Performed at [facility], [place] [address] [place], [place] [address]   Brain natriuretic peptide     Status: Abnormal   Collection Time: 06/07/18  2:27 AM  Result Value Ref Range   B Natriuretic Peptide 228.6 (H) 0.0 - 100.0 pg/mL    Comment: Performed at [facility] [address] [place], [place] [address]  Differential     Status: Abnormal   Collection Time: 06/07/18  2:27 AM  Result Value Ref Range   Neutrophils Relative % 87 %   Neutro Abs 10.2 (H) 1.7 - 7.7 K/uL   Lymphocytes Relative 6 %   Lymphs Abs 0.7 0.7 - 4.0 K/uL   Monocytes Relative 5 %   Monocytes Absolute 0.6 0.1 - 1.0 K/uL   Eosinophils Relative 2 %   Eosinophils Absolute 0.3 0.0 - 0.7 K/uL   Basophils Relative 0 %   Basophils Absolute 0.0 0.0 - 0.1 K/uL   Immature Granulocytes 0 %   Abs Immature Granulocytes 0.1 0.0 - 0.1 K/uL    Comment: Performed at [facility] [address], [place], [place] [address]  Glucose, capillary     Status: Abnormal   Collection Time: 06/07/18  6:29 AM  Result Value Ref Range   Glucose-Capillary 107 (H) 70 - 99 mg/dL  Glucose, capillary     Status: Abnormal   Collection Time: 06/07/18 11:24 AM  Result Value Ref Range   Glucose-Capillary 176 (H) 70 - 99 mg/dL  Glucose, capillary     Status: Abnormal   Collection Time: 06/07/18   4:23 PM  Result Value Ref Range   Glucose-Capillary 172 (H) 70 - 99 mg/dL  Glucose, capillary     Status: Abnormal   Collection Time: 06/07/18  9:05 PM  Result Value Ref Range   Glucose-Capillary 187 (H)  70 - 99 mg/dL  Basic metabolic panel     Status: Abnormal   Collection Time: 06/08/18  4:00 AM  Result Value Ref Range   Sodium 136 135 - 145 mmol/L   Potassium 4.8 3.5 - 5.1 mmol/L   Chloride 98 98 - 111 mmol/L    Comment: Please note change in reference range.   CO2 29 22 - 32 mmol/L   Glucose, Bld 233 (H) 70 - 99 mg/dL    Comment: Please note change in reference range.   BUN 49 (H) 8 - 23 mg/dL    Comment: Please note change in reference range.   Creatinine, Ser 2.26 (H) 0.44 - 1.00 mg/dL   Calcium 9.2 8.9 - 10.3 mg/dL   GFR calc non Af Amer 20 (L) >60 mL/min   GFR calc Af Amer 23 (L) >60 mL/min    Comment: (NOTE) The eGFR has been calculated using the CKD EPI equation. This calculation has not been validated in all clinical situations. eGFR's persistently <60 mL/min signify possible Chronic Kidney Disease.    Anion gap 9 5 - 15    Comment: Performed at [facility] [address] [place], [place] [address]  CBC with Differential/Platelet     Status: Abnormal   Collection Time: 06/08/18  4:00 AM  Result Value Ref Range   WBC 12.6 (H) 4.0 - 10.5 K/uL   RBC 2.83 (L) 3.87 - 5.11 MIL/uL   Hemoglobin 8.2 (L) 12.0 - 15.0 g/dL   HCT 26.5 (L) 36.0 - 46.0 %   MCV 93.6 78.0 - 100.0 fL   MCH 29.0 26.0 - 34.0 pg   MCHC 30.9 30.0 - 36.0 g/dL   RDW 15.5 11.5 - 15.5 %   Platelets 319 150 - 400 K/uL   Neutrophils Relative % 94 %   Lymphocytes Relative 5 %   Monocytes Relative 1 %   Eosinophils Relative 0 %   Basophils Relative 0 %   Neutro Abs 11.9 (H) 1.7 - 7.7 K/uL   Lymphs Abs 0.6 (L) 0.7 - 4.0 K/uL   Monocytes Absolute 0.1 0.1 - 1.0 K/uL   Eosinophils Absolute 0.0 0.0 - 0.7 K/uL   Basophils Absolute 0.0 0.0 - 0.1 K/uL   RBC Morphology POLYCHROMASIA PRESENT      Comment: Performed at [facility], [address], [place], [place] [address]  Glucose, capillary     Status: Abnormal   Collection Time: 06/08/18  6:19 AM  Result Value Ref Range   Glucose-Capillary 215 (H) 70 - 99 mg/dL   Comment 1 Notify RN      Constitutional: No distress . Vital signs reviewed. HENT: Normocephalic.  Atraumatic. Eyes: EOMI. No discharge. Cardiovascular: RRR. No JVD. Respiratory: CTA bilaterally. Normal effort. +[place]. GI: BS +. Non-distended. Musc: Generalized edema, stable Neuro: Alert Motor:  Bilateral upper extremities: 4+/5 proximal distal Bilateral lower extremities: Hip flexion 3-/5, knee extension 3/5, ankle dorsiflexion 5/5  Skin: Warm and dry. Intact. Psych: Normal mood.  Normal behavior.  Assessment/Plan: 1. Functional deficits secondary to L4-5 Lumbar discitis which require 3+ hours per day of interdisciplinary therapy in a comprehensive inpatient rehab setting. Physiatrist is providing close team supervision and 24 hour management of active medical problems listed below. Physiatrist and rehab team continue to assess barriers to discharge/monitor patient progress toward functional and medical goals. FIM: Function - Bathing Bathing activity did not occur: Refused Position: Bed Body parts bathed by patient: Right arm, Left arm, Chest, Abdomen Body  parts bathed by helper: Right lower leg, Left lower leg, Right upper leg, Left upper leg, Back, Front perineal area, Buttocks Assist Level: 2 helpers  Function- Upper Body Dressing/Undressing Upper body dressing/undressing activity did not occur: Refused What is the patient wearing?: Pull over shirt/dress Pull over shirt/dress - Perfomed by patient: Thread/unthread right sleeve, Thread/unthread left sleeve Pull over shirt/dress - Perfomed by helper: Put head through opening, Pull shirt over trunk Orthosis activity level: Performed by helper Assist Level: Touching or steadying assistance(Pt >  75%) Function - Lower Body Dressing/Undressing Lower body dressing/undressing activity did not occur: Refused What is the patient wearing?: Pants, [employer], Shoes Position: Bed Pants- Performed by helper: Thread/unthread right pants leg, Thread/unthread left pants leg, Pull pants up/down Non-skid slipper socks- Performed by helper: Don/doff right sock, Don/doff left sock Shoes - Performed by helper: Don/doff right shoe, Don/doff left shoe TED Hose - Performed by helper: Don/doff right TED hose, Don/doff left TED hose Assist for lower body dressing: 2 Helpers  Function - Toileting Toileting activity did not occur: Safety/medical concerns Toileting steps completed by helper: Performs perineal hygiene Assist level: Two helpers  Function - Air cabin crew transfer activity did not occur: Safety/medical concerns Toilet transfer assistive device: Elevated toilet seat/BSC over toilet(STEDY +2) Assist level to toilet: Maximal assist (Pt 25 - 49%/lift and lower) Assist level from toilet: Maximal assist (Pt 25 - 49%/lift and lower)  Function - Chair/bed transfer Chair/bed transfer activity did not occur: Refused Chair/bed transfer method: Other Chair/bed transfer assist level: Maximal assist (Pt 25 - 49%/lift and lower) Chair/bed transfer assistive device: Mechanical lift Mechanical lift: Stedy  Function - Locomotion: Wheelchair Type: Manual Wheelchair activity did not occur: Refused Max wheelchair distance: 125 Assist Level: Touching or steadying assistance (Pt > 75%) Wheel 50 feet with 2 turns activity did not occur: Refused Assist Level: Touching or steadying assistance (Pt > 75%) Wheel 150 feet activity did not occur: Refused Function - Locomotion: Ambulation Ambulation activity did not occur: Safety/medical concerns Walk 10 feet activity did not occur: Safety/medical concerns Walk 50 feet with 2 turns activity did not occur: Safety/medical concerns Walk 150 feet activity  did not occur: Safety/medical concerns Walk 10 feet on uneven surfaces activity did not occur: Safety/medical concerns  Function - Comprehension Comprehension: Auditory Comprehension assist level: Follows complex conversation/direction with extra time/assistive device  Function - Expression Expression: Verbal Expression assist level: Expresses basic needs/ideas: With extra time/assistive device  Function - Social Interaction Social Interaction assist level: Interacts appropriately with others with medication or extra time (anti-anxiety, antidepressant).  Function - Problem Solving Problem solving assist level: Solves basic 75 - 89% of the time/requires cueing 10 - 24% of the time  Function - Memory Memory assist level: Recognizes or recalls 75 - 89% of the time/requires cueing 10 - 24% of the time Patient normally able to recall (first 3 days only): Current season, Location of own room, Staff names and faces  Medical Problem List and Plan:  1. Functional deficits secondary to L4-5 osteomyelitis/discitis, lumbar spondylosis   Continue CIR  Xray ordered, reviewed stable 2. DVT Prophylaxis/Anticoagulation: Mechanical: Sequential compression devices, below knee Bilateral lower extremities   Doppler negative for DVT in LE  LUE doppler ordered to evaluate for DVT, negative 3. Pain Management: Scheduled oxycodone prior to therapy to help with activity tolerance, decreased to 5 mg on 6/27.   Added ultram prn for moderate pain. Monitor and titrate medications to help with neuropathic symptoms. Decadron d/ced.   RLE  radicular pain, partially improved on gabapentin to 659m BID, decreased to 300 mg twice a day, DC'd on 6/27 due to lethargy and tremors. Gabapentin 100 3 times a day started on/3   Low dose dexamethasone DC'd on 6/26  Baclofen changed to 5 mg daily at bedtime, patient unable to tolerate higher doses, DC'd on 6/27 due to lethargy  Lidoderm patch added on 7/2  Overall improving 4.  Mood: LCSW to follow for evaluation and support.  5. Neuropsych: This patient is capable of making decisions on her own behalf.  6. Skin/Wound Care: routine pressure relief measures  7. Fluids/Electrolytes/Nutrition: Monitor I/O.   8. Lumbar diskitis: Afebrile on scheduled tylenol. IV Abx per ID.  9. Acute on chronic anemia: Treated with iron and procrit on outpatient basis. Baseline Hgb- 9-10 range? Will continue to monitor serially   Hemoglobin 8.2 on 7/8  Hemoccult reordered  Continue to monitor 10. Symptomatic Bradycardia s/p PPM: Monitor HR bid.   Resolved 11. GIB with thrombocytopenia: Resolved  Cont to monitor 12. New diagnosis T2DM: Hgb A1c- 6.3. Poorly controlled due to recent rounds of steroids--Lantus was titrated upwards for better control. Was not on any medications at home. Monitor BS ac/hs--watch for hypoglycemic episodes as steroids tapered off.  CBG (last 3)  Recent Labs    06/07/18 1623 06/07/18 2105 06/08/18 0619  GLUCAP 172* 187* 215*   Elevated on 7/8 due to steroids, continue to monitor 13. Neuropathy BLE: Continue Gabapentin bid--monitor for SE.  14. Hyponatremia:   Sodium 136 on 7/8  Added protein supplements with meals. monitor for now.   Continue to monitor 15. Stage IV CKD: Baseline SCr 3.5-4.0. Monitor for SE/asterixis with narcotics.   Creatinine 2.26 on 7/8  Encourage fluids, IVF DC'd on 6/30  Continue to monitor 16. CHF  Monitor daily weights and for signs of overload.   Lasix increased to 80 mg daily on 6/27 (PTCA 80 twice a day), DC'd Filed Weights   06/06/18 0529 06/07/18 0532 06/08/18 0424  Weight: 92.7 kg (204 lb 5.9 oz) 93.4 kg (205 lb 14.6 oz) 93.2 kg (205 lb 6.4 oz)   Stable on 7/8 17. Morbid obesity  Encourage weight loss 18. Essential hypertension  Continue Norvasc 10 mg daily  Hydralazine 50 mg 3 times a day, increased to 75 on 6/24  Relatively controlled on 7/8 19. Hypoalbuminemia  Supplement initiated 20.  Hypokalemia  Potassium 4.8 on 7/8, supplement DC'd  Continue to monitor 21. AMS: Resolved  Likely medication induced  CXR reviewed, ECG reviewed, head CT reviewed, EEG performed - ? Borderline QTC prolongation  Discussed with pharmacy  TSH, free T4 within acceptable range  Free T3 low  Continues to have low grade fever  UA unremarkable.  Urine culture no growth  Leukocytosis resolved with WBCs 12.6 on 7/8  Blood cultures no growth  Appreciate ID recs 22. Hypothyroidism  Continue meds   See #21 23.  Sleep disturbance  Melatonin initiated on 6/30, increased on 7/1 24. HCAP  CXR showing multifocal disease  IV Abx per ID  Steroid burst per ID  LOS (Days) 17 A FACE TO FACE EVALUATION WAS PERFORMED  Ankit ALorie Phenix7/07/2018, 8:38 AM

## 2018-06-08 NOTE — Progress Notes (Signed)
Physical Therapy Session Note  Patient Details  Name: Erika Fry MRN: [id number] Date of Birth: [birth year]  Today's Date: 06/08/2018 PT Individual Time: 0901-1000 PT Individual Time Calculation (min): 59 min   Short Term Goals: Week 3:  PT Short Term Goal 1 (Week 3): Pt will perform sit<>stand with max assist of 1 outside of Stedy PT Short Term Goal 2 (Week 3): Pt will initiate gait training  PT Short Term Goal 3 (Week 3): Pt will maintain standing tolerance x 2 min to prepare for ambulation PT Short Term Goal 4 (Week 3): Pt will perform bed mobility with mod assist  PT Short Term Goal 5 (Week 3): Pt will perform bed<>chair transfer with max assist of 1 consistently and LRAD   Skilled Therapeutic Interventions/Progress Updates:    Pt supine in bed upon PT arrival, agreeable to therapy tx and reports pain 6/10 in the R hip. Pt reports she just received pain medicine. Pt performed rolling in each direction with mod assist while therapist assisted to don shorts. Therapist donned socks and teds total assist for time management. Pt transferred from supine>sitting EOB with max assist to bring trunk up to sitting. Pt maintained seated balance while catching her breath, verbal cues for breathing techniques. Pt performed sit<>stand from EOB within the stedy Max assist +2. Pt transported to TIS w/c total assist. Pt performed sit<>stand from elevated stedy seat with mod assist. Pt transported to the gym. Pt performed x 4 sit<>stands within the parallel bars with max assist, in standing pt worked on pre-gait stepping forward/back with each LE x 3, worked on lateral weightshifting and the last trial pt able to hold static standing position for 39min,17sec, min-mod assist for standing balance. Vitals monitored throughout session, BP 122/68 and SpO2 98% on 2L O2/min. Pt transported back to room at end off session, heat applied for pain releif, and pt educated on importance of staying up in TIS for OOB  tolerance.   Therapy Documentation Precautions:  Precautions Precautions: Fall Precaution Comments: Pacemaker placement 4 weeks ago Required Braces or Orthoses: Spinal Brace Spinal Brace: Lumbar corset Restrictions Weight Bearing Restrictions: No  See Function Navigator for Current Functional Status.   Therapy/Group: Individual Therapy  Netta Corrigan, PT, DPT 06/08/2018, 7:46 AM

## 2018-06-09 ENCOUNTER — Inpatient Hospital Stay (HOSPITAL_COMMUNITY): Payer: Medicare Other | Admitting: Physical Therapy

## 2018-06-09 ENCOUNTER — Inpatient Hospital Stay (HOSPITAL_COMMUNITY): Payer: Medicare Other | Admitting: Occupational Therapy

## 2018-06-09 ENCOUNTER — Inpatient Hospital Stay (HOSPITAL_COMMUNITY): Payer: Medicare Other

## 2018-06-09 DIAGNOSIS — T380X5A Adverse effect of glucocorticoids and synthetic analogues, initial encounter: Secondary | ICD-10-CM

## 2018-06-09 DIAGNOSIS — R601 Generalized edema: Secondary | ICD-10-CM

## 2018-06-09 DIAGNOSIS — R739 Hyperglycemia, unspecified: Secondary | ICD-10-CM

## 2018-06-09 LAB — GLUCOSE, CAPILLARY
GLUCOSE-CAPILLARY: 316 mg/dL — AB (ref 70–99)
GLUCOSE-CAPILLARY: 303 mg/dL — AB (ref 70–99)
Glucose-Capillary: 246 mg/dL — ABNORMAL HIGH (ref 70–99)
Glucose-Capillary: 265 mg/dL — ABNORMAL HIGH (ref 70–99)

## 2018-06-09 MED ORDER — PREDNISONE 5 MG PO TABS
30.0000 mg | ORAL_TABLET | Freq: Every day | ORAL | Status: AC
  Administered 2018-06-14 – 2018-06-15 (×2): 30 mg via ORAL
  Filled 2018-06-09 (×3): qty 1

## 2018-06-09 MED ORDER — PREDNISONE 5 MG PO TABS
10.0000 mg | ORAL_TABLET | Freq: Every day | ORAL | Status: AC
  Administered 2018-06-18 – 2018-06-19 (×2): 10 mg via ORAL
  Filled 2018-06-09 (×2): qty 2

## 2018-06-09 MED ORDER — PREDNISONE 20 MG PO TABS
40.0000 mg | ORAL_TABLET | Freq: Every day | ORAL | Status: AC
  Administered 2018-06-12 – 2018-06-13 (×2): 40 mg via ORAL
  Filled 2018-06-09 (×2): qty 2

## 2018-06-09 MED ORDER — PREDNISONE 5 MG PO TABS
5.0000 mg | ORAL_TABLET | Freq: Every day | ORAL | Status: AC
  Administered 2018-06-20 – 2018-06-21 (×2): 5 mg via ORAL
  Filled 2018-06-09 (×2): qty 1

## 2018-06-09 MED ORDER — PREDNISONE 20 MG PO TABS
20.0000 mg | ORAL_TABLET | Freq: Every day | ORAL | Status: AC
  Administered 2018-06-16 – 2018-06-17 (×2): 20 mg via ORAL
  Filled 2018-06-09 (×2): qty 1

## 2018-06-09 MED ORDER — PREDNISONE 5 MG PO TABS: 50.0000 mg | ORAL_TABLET | Freq: Every day | ORAL | Status: DC

## 2018-06-09 MED ORDER — PREDNISONE 5 MG PO TABS
50.0000 mg | ORAL_TABLET | Freq: Every day | ORAL | Status: AC
  Administered 2018-06-10 – 2018-06-11 (×2): 50 mg via ORAL
  Filled 2018-06-09 (×2): qty 2

## 2018-06-09 MED ORDER — PREMIER PROTEIN SHAKE
11.0000 [oz_av] | Freq: Three times a day (TID) | ORAL | Status: DC
  Administered 2018-06-09 – 2018-06-20 (×26): 11 [oz_av] via ORAL
  Filled 2018-06-09 (×46): qty 325.31

## 2018-06-09 NOTE — Progress Notes (Signed)
Physical Therapy Session Note  Patient Details  Name: Erika Fry MRN: [id number] Date of Birth: 02-23-1946  Today's Date: 06/09/2018 PT Individual Time: 1430-1500 PT Individual Time Calculation (min): 30 min   Short Term Goals: Week 3:  PT Short Term Goal 1 (Week 3): Pt will perform sit<>stand with max assist of 1 outside of Stedy PT Short Term Goal 2 (Week 3): Pt will initiate gait training  PT Short Term Goal 3 (Week 3): Pt will maintain standing tolerance x 2 min to prepare for ambulation PT Short Term Goal 4 (Week 3): Pt will perform bed mobility with mod assist  PT Short Term Goal 5 (Week 3): Pt will perform bed<>chair transfer with max assist of 1 consistently and LRAD   Skilled Therapeutic Interventions/Progress Updates:    no c/o pain at rest, but does report discomfort in R hip with mobility, does not rate.  Heat applied at end of session with positive results.  Session focus on functional bed mobility and transfers with slide board as well as pt education on progressing activity tolerance and stretching for pain relief.   Pt requires mod assist to elevate trunk for supine>sit with HOB elevated and use of bed rails.  Slide board transfer bed<>w/c with assist to place board, and min fade to supervision assist for transfer. Pt demos correct placement for hands/feet and appropriate head/hips relationship throughout transfer with minimal cuing from PT.  Discussed pain in R hip and suggested stretch for hamstrings, glutes, and piriformis (did not attempt due to time constraints), and heat application.  Pt returned to bed at end of session with mod assist for LEs into bed and max assist to scoot to [place].  Positioned to comfort with heat applied to R hip.  Call bell in reach and needs met.   Therapy Documentation Precautions:  Precautions Precautions: Fall Precaution Comments: Pacemaker placement 4 weeks ago Required Braces or Orthoses: Spinal Brace Spinal Brace: Lumbar  corset Restrictions Weight Bearing Restrictions: No   See Function Navigator for Current Functional Status.   Therapy/Group: Individual Therapy  Michel Santee 06/09/2018, 4:33 PM

## 2018-06-09 NOTE — Progress Notes (Signed)
Subjective/Complaints: Patient seen sitting up in her bed this morning. She states she slept well overnight. She states she feels better with supplemental oxygen. She was seen by ID yesterday, with plans to continue IV abx.  ROS: Denies CP, S OB, nausea, vomiting diarrhea.  Objective: Vital Signs: Blood pressure 132/68, pulse 89, temperature (!) 97.5 F (36.4 C), temperature source Oral, resp. rate 18, height 5' 5"  (1.651 m), weight 94.8 kg (208 lb 15.9 oz), SpO2 97 %. No results found. Results for orders placed or performed during the hospital encounter of 05/22/18 (from the past 72 hour(s))  Glucose, capillary     Status: Abnormal   Collection Time: 06/06/18 11:43 AM  Result Value Ref Range   Glucose-Capillary 138 (H) 70 - 99 mg/dL  Glucose, capillary     Status: Abnormal   Collection Time: 06/06/18  4:42 PM  Result Value Ref Range   Glucose-Capillary 139 (H) 70 - 99 mg/dL  Glucose, capillary     Status: Abnormal   Collection Time: 06/06/18  9:23 PM  Result Value Ref Range   Glucose-Capillary 129 (H) 70 - 99 mg/dL  Basic metabolic panel     Status: Abnormal   Collection Time: 06/07/18  2:27 AM  Result Value Ref Range   Sodium 135 135 - 145 mmol/L   Potassium 4.4 3.5 - 5.1 mmol/L   Chloride 97 (L) 98 - 111 mmol/L    Comment: Please note change in reference range.   CO2 27 22 - 32 mmol/L   Glucose, Bld 123 (H) 70 - 99 mg/dL    Comment: Please note change in reference range.   BUN 40 (H) 8 - 23 mg/dL    Comment: Please note change in reference range.   Creatinine, Ser 2.12 (H) 0.44 - 1.00 mg/dL   Calcium 9.1 8.9 - 10.3 mg/dL   GFR calc non Af Amer 22 (L) >60 mL/min   GFR calc Af Amer 25 (L) >60 mL/min    Comment: (NOTE) The eGFR has been calculated using the CKD EPI equation. This calculation has not been validated in all clinical situations. eGFR's persistently <60 mL/min signify possible Chronic Kidney Disease.    Anion gap 11 5 - 15    Comment: Performed at [facility] [address], [place], [place] [address]  CBC     Status: Abnormal   Collection Time: 06/07/18  2:27 AM  Result Value Ref Range   WBC 12.3 (H) 4.0 - 10.5 K/uL   RBC 2.77 (L) 3.87 - 5.11 MIL/uL   Hemoglobin 8.2 (L) 12.0 - 15.0 g/dL   HCT 26.1 (L) 36.0 - 46.0 %   MCV 94.2 78.0 - 100.0 fL   MCH 29.6 26.0 - 34.0 pg   MCHC 31.4 30.0 - 36.0 g/dL   RDW 15.4 11.5 - 15.5 %   Platelets 269 150 - 400 K/uL    Comment: Performed at [facility], [place] [address] [place], [place] [address]  D-dimer, quantitative (not at [facility])     Status: Abnormal   Collection Time: 06/07/18  2:27 AM  Result Value Ref Range   D-Dimer, Quant 14.10 (H) 0.00 - 0.50 ug/mL-FEU    Comment: (NOTE) At the manufacturer cut-off of 0.50 ug/mL FEU, this assay has been documented to exclude PE with a sensitivity and negative predictive value of 97 to 99%.  At this time, this assay has not been approved by the FDA to exclude DVT/VTE. Results should be correlated with  clinical presentation. Performed at [facility], [place] [address] [place], [place] [address]   Brain natriuretic peptide     Status: Abnormal   Collection Time: 06/07/18  2:27 AM  Result Value Ref Range   B Natriuretic Peptide 228.6 (H) 0.0 - 100.0 pg/mL    Comment: Performed at [facility] [address], [place], [place] [address]  Differential     Status: Abnormal   Collection Time: 06/07/18  2:27 AM  Result Value Ref Range   Neutrophils Relative % 87 %   Neutro Abs 10.2 (H) 1.7 - 7.7 K/uL   Lymphocytes Relative 6 %   Lymphs Abs 0.7 0.7 - 4.0 K/uL   Monocytes Relative 5 %   Monocytes Absolute 0.6 0.1 - 1.0 K/uL   Eosinophils Relative 2 %   Eosinophils Absolute 0.3 0.0 - 0.7 K/uL   Basophils Relative 0 %   Basophils Absolute 0.0 0.0 - 0.1 K/uL   Immature Granulocytes 0 %   Abs Immature Granulocytes 0.1 0.0 - 0.1 K/uL    Comment: Performed at [facility] [address] [place], [place] [address]  Glucose,  capillary     Status: Abnormal   Collection Time: 06/07/18  6:29 AM  Result Value Ref Range   Glucose-Capillary 107 (H) 70 - 99 mg/dL  Glucose, capillary     Status: Abnormal   Collection Time: 06/07/18 11:24 AM  Result Value Ref Range   Glucose-Capillary 176 (H) 70 - 99 mg/dL  Glucose, capillary     Status: Abnormal   Collection Time: 06/07/18  4:23 PM  Result Value Ref Range   Glucose-Capillary 172 (H) 70 - 99 mg/dL  Glucose, capillary     Status: Abnormal   Collection Time: 06/07/18  9:05 PM  Result Value Ref Range   Glucose-Capillary 187 (H) 70 - 99 mg/dL  Basic metabolic panel     Status: Abnormal   Collection Time: 06/08/18  4:00 AM  Result Value Ref Range   Sodium 136 135 - 145 mmol/L   Potassium 4.8 3.5 - 5.1 mmol/L   Chloride 98 98 - 111 mmol/L    Comment: Please note change in reference range.   CO2 29 22 - 32 mmol/L   Glucose, Bld 233 (H) 70 - 99 mg/dL    Comment: Please note change in reference range.   BUN 49 (H) 8 - 23 mg/dL    Comment: Please note change in reference range.   Creatinine, Ser 2.26 (H) 0.44 - 1.00 mg/dL   Calcium 9.2 8.9 - 10.3 mg/dL   GFR calc non Af Amer 20 (L) >60 mL/min   GFR calc Af Amer 23 (L) >60 mL/min    Comment: (NOTE) The eGFR has been calculated using the CKD EPI equation. This calculation has not been validated in all clinical situations. eGFR's persistently <60 mL/min signify possible Chronic Kidney Disease.    Anion gap 9 5 - 15    Comment: Performed at [facility] [address], [place], [place] [address]  CBC with Differential/Platelet     Status: Abnormal   Collection Time: 06/08/18  4:00 AM  Result Value Ref Range   WBC 12.6 (H) 4.0 - 10.5 K/uL   RBC 2.83 (L) 3.87 - 5.11 MIL/uL   Hemoglobin 8.2 (L) 12.0 - 15.0 g/dL   HCT 26.5 (L) 36.0 - 46.0 %   MCV 93.6 78.0 - 100.0 fL   MCH 29.0 26.0 - 34.0 pg  MCHC 30.9 30.0 - 36.0 g/dL   RDW 15.5 11.5 - 15.5 %   Platelets 319 150 - 400 K/uL   Neutrophils Relative % 94 %    Lymphocytes Relative 5 %   Monocytes Relative 1 %   Eosinophils Relative 0 %   Basophils Relative 0 %   Neutro Abs 11.9 (H) 1.7 - 7.7 K/uL   Lymphs Abs 0.6 (L) 0.7 - 4.0 K/uL   Monocytes Absolute 0.1 0.1 - 1.0 K/uL   Eosinophils Absolute 0.0 0.0 - 0.7 K/uL   Basophils Absolute 0.0 0.0 - 0.1 K/uL   RBC Morphology POLYCHROMASIA PRESENT     Comment: Performed at [facility], [address] [place], [place] [address]  Glucose, capillary     Status: Abnormal   Collection Time: 06/08/18  6:19 AM  Result Value Ref Range   Glucose-Capillary 215 (H) 70 - 99 mg/dL   Comment 1 Notify RN   Glucose, capillary     Status: Abnormal   Collection Time: 06/08/18 11:57 AM  Result Value Ref Range   Glucose-Capillary 254 (H) 70 - 99 mg/dL  Glucose, capillary     Status: Abnormal   Collection Time: 06/08/18  4:35 PM  Result Value Ref Range   Glucose-Capillary 235 (H) 70 - 99 mg/dL  Glucose, capillary     Status: Abnormal   Collection Time: 06/08/18  9:12 PM  Result Value Ref Range   Glucose-Capillary 303 (H) 70 - 99 mg/dL  Glucose, capillary     Status: Abnormal   Collection Time: 06/09/18  6:44 AM  Result Value Ref Range   Glucose-Capillary 246 (H) 70 - 99 mg/dL     Constitutional: No distress . Vital signs reviewed. HENT: Normocephalic.  Atraumatic. Eyes: EOMI. No discharge. Cardiovascular: RRR. No JVD. Respiratory: CTA bilaterally. Normal effort. +Sparkman. GI: BS +. Non-distended. Musc: Generalized edema, increasing with steroids Neuro: Alert Motor:  Bilateral upper extremities: 4+/5 proximal distal (stable) Bilateral lower extremities: Hip flexion 3-/5, knee extension 3/5, ankle dorsiflexion 5/5 (stable)  Skin: Warm and dry. Intact. Psych: Normal mood.  Normal behavior.  Assessment/Plan: 1. Functional deficits secondary to L4-5 Lumbar discitis which require 3+ hours per day of interdisciplinary therapy in a comprehensive inpatient rehab setting. Physiatrist is providing close  team supervision and 24 hour management of active medical problems listed below. Physiatrist and rehab team continue to assess barriers to discharge/monitor patient progress toward functional and medical goals. FIM: Function - Bathing Bathing activity did not occur: Refused Position: Bed Body parts bathed by patient: Right arm, Left arm, Chest, Abdomen Body parts bathed by helper: Right lower leg, Left lower leg, Right upper leg, Left upper leg, Back, Front perineal area, Buttocks Assist Level: 2 helpers  Function- Upper Body Dressing/Undressing Upper body dressing/undressing activity did not occur: Refused What is the patient wearing?: Pull over shirt/dress Pull over shirt/dress - Perfomed by patient: Thread/unthread right sleeve, Thread/unthread left sleeve Pull over shirt/dress - Perfomed by helper: Put head through opening, Pull shirt over trunk Orthosis activity level: Performed by helper Assist Level: Touching or steadying assistance(Pt > 75%) Function - Lower Body Dressing/Undressing Lower body dressing/undressing activity did not occur: Refused What is the patient wearing?: Pants, [employer], Shoes Position: Bed Pants- Performed by helper: Thread/unthread right pants leg, Thread/unthread left pants leg, Pull pants up/down Non-skid slipper socks- Performed by helper: Don/doff right sock, Don/doff left sock Shoes - Performed by helper: Don/doff right shoe, Don/doff left shoe TED Hose - Performed by helper:  Don/doff right TED hose, Don/doff left TED hose Assist for lower body dressing: 2 Helpers  Function - Toileting Toileting activity did not occur: Safety/medical concerns Toileting steps completed by helper: Performs perineal hygiene Assist level: Two helpers  Function - Air cabin crew transfer activity did not occur: Safety/medical concerns Toilet transfer assistive device: Elevated toilet seat/BSC over toilet(STEDY +2) Assist level to toilet: Maximal assist (Pt 25 -  49%/lift and lower) Assist level from toilet: Maximal assist (Pt 25 - 49%/lift and lower)  Function - Chair/bed transfer Chair/bed transfer activity did not occur: Refused Chair/bed transfer method: Other Chair/bed transfer assist level: Total assist (Pt < 25%) Chair/bed transfer assistive device: Mechanical lift Mechanical lift: Stedy  Function - Locomotion: Wheelchair Type: Manual Wheelchair activity did not occur: Refused Max wheelchair distance: 125 Assist Level: Touching or steadying assistance (Pt > 75%) Wheel 50 feet with 2 turns activity did not occur: Refused Assist Level: Touching or steadying assistance (Pt > 75%) Wheel 150 feet activity did not occur: Refused Function - Locomotion: Ambulation Ambulation activity did not occur: Safety/medical concerns Walk 10 feet activity did not occur: Safety/medical concerns Walk 50 feet with 2 turns activity did not occur: Safety/medical concerns Walk 150 feet activity did not occur: Safety/medical concerns Walk 10 feet on uneven surfaces activity did not occur: Safety/medical concerns  Function - Comprehension Comprehension: Auditory Comprehension assist level: Follows complex conversation/direction with extra time/assistive device  Function - Expression Expression: Verbal Expression assist level: Expresses basic needs/ideas: With extra time/assistive device  Function - Social Interaction Social Interaction assist level: Interacts appropriately with others with medication or extra time (anti-anxiety, antidepressant).  Function - Problem Solving Problem solving assist level: Solves basic 75 - 89% of the time/requires cueing 10 - 24% of the time  Function - Memory Memory assist level: Recognizes or recalls 75 - 89% of the time/requires cueing 10 - 24% of the time Patient normally able to recall (first 3 days only): Current season, Location of own room, Staff names and faces  Medical Problem List and Plan:  1. Functional  deficits secondary to L4-5 osteomyelitis/discitis, lumbar spondylosis   Continue CIR  Xray ordered, reviewed stable 2. DVT Prophylaxis/Anticoagulation: Mechanical: Sequential compression devices, below knee Bilateral lower extremities   Doppler negative for DVT in LE  LUE doppler ordered to evaluate for DVT, negative 3. Pain Management: Scheduled oxycodone prior to therapy to help with activity tolerance, decreased to 5 mg on 6/27.   Added ultram prn for moderate pain. Monitor and titrate medications to help with neuropathic symptoms. Decadron d/ced.   RLE radicular pain, partially improved on gabapentin to 620m BID, decreased to 300 mg twice a day, DC'd on 6/27 due to lethargy and tremors. Gabapentin 100 3 times a day started on/3   Low dose dexamethasone DC'd on 6/26  Baclofen changed to 5 mg daily at bedtime, patient unable to tolerate higher doses, DC'd on 6/27 due to lethargy  Lidoderm patch added on 7/2  Overall improving 4. Mood: LCSW to follow for evaluation and support.  5. Neuropsych: This patient is capable of making decisions on her own behalf.  6. Skin/Wound Care: routine pressure relief measures  7. Fluids/Electrolytes/Nutrition: Monitor I/O.   8. Lumbar diskitis: Afebrile on scheduled tylenol. IV Abx per ID.  9. Acute on chronic anemia: Treated with iron and procrit on outpatient basis. Baseline Hgb- 9-10 range? Will continue to monitor serially   Hemoglobin 8.2 on 7/8  Hemoccult reordered, pending  Continue to monitor 10. Symptomatic Bradycardia  s/p PPM: Monitor HR bid.   Resolved 11. GIB with thrombocytopenia: Resolved  Cont to monitor 12. New diagnosis T2DM: Hgb A1c- 6.3. Poorly controlled due to recent rounds of steroids--Lantus was titrated upwards for better control. Was not on any medications at home. Monitor BS ac/hs--watch for hypoglycemic episodes as steroids tapered off.  CBG (last 3)  Recent Labs    06/08/18 1635 06/08/18 2112 06/09/18 0644  GLUCAP 235*  303* 246*   Elevated on 7/9 with steroids, will not make any further changes until steroids DC'd 13. Neuropathy BLE: Continue Gabapentin bid--monitor for SE.  14. Hyponatremia:   Sodium 136 on 7/8  Added protein supplements with meals. monitor for now.   Continue to monitor 15. Stage IV CKD: Baseline SCr 3.5-4.0. Monitor for SE/asterixis with narcotics.   Creatinine 2.26 on 7/8  Encourage fluids, IVF DC'd on 6/30  Continue to monitor 16. CHF  Monitor daily weights and for signs of overload.   Lasix increased to 80 mg daily on 6/27 (PTCA 80 twice a day), DC'd Filed Weights   06/07/18 0532 06/08/18 0424 06/09/18 0604  Weight: 93.4 kg (205 lb 14.6 oz) 93.2 kg (205 lb 6.4 oz) 94.8 kg (208 lb 15.9 oz)   Relatively stable on 7/9 17. Morbid obesity  Encourage weight loss 18. Essential hypertension  Continue Norvasc 10 mg daily  Hydralazine 50 mg 3 times a day, increased to 75 on 6/24  Relatively controlled on 7/9 19. Hypoalbuminemia  Supplement initiated 20. Hypokalemia  Potassium 4.8 on 7/8, supplement DC'd  Continue to monitor 21. AMS: Resolved  Likely medication induced  CXR reviewed, ECG reviewed, head CT reviewed, EEG performed - ? Borderline QTC prolongation  Discussed with pharmacy  TSH, free T4 within acceptable range  Free T3 low  Continues to have low grade fever  UA unremarkable.  Urine culture no growth  Leukocytosis with WBCs 12.6 on 7/8, likely steroid-induced  Blood cultures no growth  Appreciate ID recs 22. Hypothyroidism  Continue meds   See #21 23.  Sleep disturbance  Melatonin initiated on 6/30, increased on 7/1  Improving 24. 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

## 2018-06-09 NOTE — Progress Notes (Signed)
Occupational Therapy Session Note  Patient Details  Name: Erika Fry MRN: [id number] Date of Birth: [birth year]  Today's Date: 06/09/2018 OT Individual Time: 0930-1030 OT Individual Time Calculation (min): 60 min    Short Term Goals: Week 2:  OT Short Term Goal 1 (Week 2): Pt will move from sidelying to sit with mod A of 1 to prepare for bathing/dressing EOB or in wc. OT Short Term Goal 2 (Week 2): Pt will maintain static sitting EOB with min A for 5-10 min. OT Short Term Goal 3 (Week 2): Pt will don shirt with min A. OT Short Term Goal 4 (Week 2): Pt will sit to stand in bariatric Stedy with mod A of 1 to prepare for transfer to w/c.  Skilled Therapeutic Interventions/Progress Updates:    Treatment session with focus on ADL retraining with LB dressing, grooming, and toilet transfers.  Pt received supine in bed declining bathing but willing to complete LB dressing and get OOB to brush teeth.  Educated pt on LB dressing in circle sitting or figure 4 position, however pt unable to get legs up high enough to reach to thread pants.  Plan to attempt with reacher during next treatment session.  Pulled pants over hips with pt rolling Rt and Lt in bed while therapist pulled pants over hips.  Mod assist bed mobility to sitting EOB.  Completed slide board transfer to w/c with assist to place slide board and then mod assist initially fading to min assist once fully on slide board.  Engaged in grooming tasks seated at sink with encouragement for pt to reach forward to obtain items.  Pt reports need to toilet.  Completed transfer onto [facility] with use of Stedy with +2 for safety and due to urgency.  Pt left upright on [facility] with nurse tech present.  Therapy Documentation Precautions:  Precautions Precautions: Fall Precaution Comments: Pacemaker placement 4 weeks ago Required Braces or Orthoses: Spinal Brace Spinal Brace: Lumbar corset Restrictions Weight Bearing Restrictions: No Pain: Pain  Assessment Pain Score: 4   See Function Navigator for Current Functional Status.   Therapy/Group: Individual Therapy  Simonne Come 06/09/2018, 12:16 PM

## 2018-06-09 NOTE — Progress Notes (Signed)
Physical Therapy Session Note  Patient Details  Name: Erika Fry MRN: [id number] Date of Birth: February 15, 1946  Today's Date: 06/09/2018 PT Individual Time: 1118-1201 PT Individual Time Calculation (min): 43 min   Short Term Goals: Week 3:  PT Short Term Goal 1 (Week 3): Pt will perform sit<>stand with max assist of 1 outside of Stedy PT Short Term Goal 2 (Week 3): Pt will initiate gait training  PT Short Term Goal 3 (Week 3): Pt will maintain standing tolerance x 2 min to prepare for ambulation PT Short Term Goal 4 (Week 3): Pt will perform bed mobility with mod assist  PT Short Term Goal 5 (Week 3): Pt will perform bed<>chair transfer with max assist of 1 consistently and LRAD   Skilled Therapeutic Interventions/Progress Updates: Pt presented in [place] chair agreeable to therapy. Pt transported to rehab gym for energy conservation. PA requesting to attempt to decrease O2 as currently on 1L, decreased to .5L. Pt performed sit to stand from [place] with maxA x1 (+2 for safety) and transferred to mat. Pt participated in unsupported sitting activities including kicking ball with BLE and ball toss. Pt also performed BUE therex including shoulder flexion, forward/backwards rolls, chest press, and bicep curls x 10 ea. Pt able to maintain balance and maintain SpO2 >93% throughout all activities. Performed sit to stand from Stedy x 3 for LE strengthening and returned to w/c. Pt transported back to room and hot pack placed on R hip for comfort. Pt encouraged to remain in TIS for meal and longer if possible. Pt remained in TIS and set up for lunch with NT present to check BS.      Therapy Documentation Precautions:  Precautions Precautions: Fall Precaution Comments: Pacemaker placement 4 weeks ago Required Braces or Orthoses: Spinal Brace Spinal Brace: Lumbar corset Restrictions Weight Bearing Restrictions: No General:   Vital Signs:  Pain: Pain Assessment Pain Scale: 0-10 Pain Score: 8  Pain  Type: Acute pain Pain Location: Hip Pain Descriptors / Indicators: Aching Pain Onset: Gradual Pain Intervention(s): Medication (See eMAR);Heat applied See Function Navigator for Current Functional Status.   Therapy/Group: Individual Therapy  Sarahanne Novakowski  Jaking Thayer, PTA  06/09/2018, 12:42 PM

## 2018-06-09 NOTE — Progress Notes (Addendum)
[facility]  Date of Admission:  05/22/2018   Total days of antibiotics 27        Day 3: Ceftaroline          ASSESSMENT: Erika Fry is 72 y.o female who presented with lumbar discitis on admission and developed shortness of breath and patchy pneumonia on chest xray about ten days after admission. She received 25 days of ceftriaxone treatment and 18 days of daptomycin, both which were discontinued. Ceftriaxone was switched to ceftaroline for MRSA coverage. Daptomycin was stopped be we suspected it to be the cause of eosinophilic pneumonia. However, Erika Fry does not have any peripheral eosinophilia to suggest eosinophilic pneumonia due to daptomycin therapy. We plan to continue ceftaroline therapy for now. We will consider oral therapy options if treatment is necessary after discharge.  PLAN: 1. Continue ceftaroline therapy  Principal Problem:   Lumbar discitis Active Problems:   CKD (chronic kidney disease), stage IV (HCC)   HCAP (healthcare-associated pneumonia)   Diet-controlled diabetes mellitus (HCC)   Iron deficiency anemia   Essential hypertension   Gastric polyp   Acute right-sided low back pain with right-sided sciatica   Radicular pain   Acute blood loss anemia   Anemia of chronic disease   Thrombocytopenia ([place])   New onset type 2 diabetes mellitus ([place])   Morbid obesity (HCC)   Hyponatremia   Benign essential HTN   Hypoalbuminemia due to protein-calorie malnutrition (HCC)   Edema of hand   Hyperkalemia   SOB (shortness of breath)   Somnolence   Delirium   Sleep disturbance   Hypertensive crisis   Hypokalemia   Generalized edema   Steroid-induced hyperglycemia   Scheduled Meds: . acetaminophen  650 mg Oral TID AC & HS  . allopurinol  200 mg Oral Daily  . amLODipine  10 mg Oral Daily  . Chlorhexidine Gluconate Cloth  6 each Topical Q0600  . ferrous sulfate  325 mg Oral BID WC  . folic acid  1 mg Oral Daily  .  gabapentin  100 mg Oral TID  . hydrALAZINE  75 mg Oral TID  . insulin aspart  0-5 Units Subcutaneous QHS  . insulin aspart  0-9 Units Subcutaneous TID WC  . insulin glargine  20 Units Subcutaneous Daily  . levothyroxine  75 mcg Oral QAC breakfast  . lidocaine  1 patch Transdermal Q24H  . loratadine  10 mg Oral Daily  . Melatonin  1.5 mg Oral QHS  . multivitamin with minerals   Oral Daily  . oxyCODONE  5 mg Oral BID WC  . pantoprazole  40 mg Oral Q0600  . [START ON 06/10/2018] predniSONE  50 mg Oral Q breakfast   Followed by  . [START ON 06/12/2018] predniSONE  40 mg Oral Q breakfast   Followed by  . [START ON 06/14/2018] predniSONE  30 mg Oral Q breakfast   Followed by  . [START ON 06/16/2018] predniSONE  20 mg Oral Q breakfast   Followed by  . [START ON 06/18/2018] predniSONE  10 mg Oral Q breakfast   Followed by  . [START ON 06/20/2018] predniSONE  5 mg Oral Q breakfast  . protein supplement shake  11 oz Oral TID  . senna  1 tablet Oral Daily  . simvastatin  40 mg Oral QHS  . vitamin B-12  1,000 mcg Oral Daily   Continuous Infusions: . sodium chloride Stopped (06/08/18 2124)  . ceFTAROline (TEFLARO)  IV 300 mg (06/09/18 0847)   PRN Meds:.acetaminophen, acetaminophen, alum & mag hydroxide-simeth, bisacodyl, diphenhydrAMINE, fluticasone, guaiFENesin-dextromethorphan, naLOXone (NARCAN)  injection, oxyCODONE, polyethylene glycol, prochlorperazine **OR** prochlorperazine **OR** prochlorperazine, sodium chloride flush, sodium phosphate, traMADol, traZODone   SUBJECTIVE: Erika Fry was seen sitting up in her physical therapy chair this morning. She stated she moved from her bed to chair mostly bearing all her own weight with little assistance. She states she feels better today and denied any SOB, chest pain and trouble breathing. She said she is on less O2 and is having no trouble breathing. She said she is still having some cough but no sputum production. She stated her back pain has  slightly improved since yesterday; however, she felt it worsen as she moved from her bed to the therapy chair.    Review of Systems: Review of Systems  Constitutional: Negative for chills and fever.  Respiratory: Positive for cough. Negative for sputum production, shortness of breath and wheezing.   Cardiovascular: Negative for chest pain.  Musculoskeletal: Positive for back pain.    Allergies  Allergen Reactions  . Codeine Other (See Comments)    Increases Pain and couldn't sleep    OBJECTIVE: Vitals:   06/08/18 1935 06/08/18 2034 06/09/18 0411 06/09/18 0604  BP: 123/65 131/69 132/68   Pulse: 99 91 89   Resp: 20  18   Temp: 98.7 F (37.1 C)  (!) 97.5 F (36.4 C)   TempSrc: Oral  Oral   SpO2: 94% 97% 97%   Weight:    94.8 kg (208 lb 15.9 oz)  Height:       Body mass index is 34.78 kg/m.  Physical Exam  Constitutional: She is oriented to person, place, and time.  Cardiovascular: Normal rate, regular rhythm and normal heart sounds.  Pulmonary/Chest: Effort normal and breath sounds normal. No respiratory distress.  Neurological: She is alert and oriented to person, place, and time.    Lab Results Lab Results  Component Value Date   WBC 12.6 (H) 06/08/2018   HGB 8.2 (L) 06/08/2018   HCT 26.5 (L) 06/08/2018   MCV 93.6 06/08/2018   PLT 319 06/08/2018    Lab Results  Component Value Date   CREATININE 2.26 (H) 06/08/2018   BUN 49 (H) 06/08/2018   NA 136 06/08/2018   K 4.8 06/08/2018   CL 98 06/08/2018   CO2 29 06/08/2018    Lab Results  Component Value Date   ALT 35 05/29/2018   AST 77 (H) 05/29/2018   ALKPHOS 102 05/29/2018   BILITOT 0.6 05/29/2018     Microbiology: Recent Results (from the past 240 hour(s))  Culture, blood (routine x 2)     Status: None   Collection Time: 05/30/18  6:43 PM  Result Value Ref Range Status   Specimen Description BLOOD RIGHT ARM  Final   Special Requests   Final    BOTTLES DRAWN AEROBIC AND ANAEROBIC Blood Culture  results may not be optimal due to an inadequate volume of blood received in culture bottles   Culture   Final    NO GROWTH 5 DAYS Performed at [facility], [place] [address], [place], [place] [address]    Report Status 06/04/2018 FINAL  Final  Culture, blood (routine x 2)     Status: None   Collection Time: 05/30/18  6:53 PM  Result Value Ref Range Status   Specimen Description BLOOD RIGHT WRIST  Final   Special Requests   Final  BOTTLES DRAWN AEROBIC AND ANAEROBIC Blood Culture results may not be optimal due to an inadequate volume of blood received in culture bottles   Culture   Final    NO GROWTH 5 DAYS Performed at [facility], [place] [address], [place], [place] [address]    Report Status 06/04/2018 FINAL  Final    Adalaide Jaskolski Dutch Quint, [place] [facility]  06/09/2018, 10:29 AM

## 2018-06-10 ENCOUNTER — Inpatient Hospital Stay (HOSPITAL_COMMUNITY): Payer: Medicare Other

## 2018-06-10 ENCOUNTER — Inpatient Hospital Stay (HOSPITAL_COMMUNITY): Payer: Medicare Other | Admitting: Occupational Therapy

## 2018-06-10 ENCOUNTER — Inpatient Hospital Stay (HOSPITAL_COMMUNITY): Payer: Medicare Other | Admitting: Physical Therapy

## 2018-06-10 DIAGNOSIS — R918 Other nonspecific abnormal finding of lung field: Secondary | ICD-10-CM

## 2018-06-10 DIAGNOSIS — R05 Cough: Secondary | ICD-10-CM

## 2018-06-10 DIAGNOSIS — R0602 Shortness of breath: Secondary | ICD-10-CM

## 2018-06-10 LAB — GLUCOSE, CAPILLARY
GLUCOSE-CAPILLARY: 304 mg/dL — AB (ref 70–99)
GLUCOSE-CAPILLARY: 289 mg/dL — AB (ref 70–99)
GLUCOSE-CAPILLARY: 303 mg/dL — AB (ref 70–99)
Glucose-Capillary: 309 mg/dL — ABNORMAL HIGH (ref 70–99)

## 2018-06-10 MED ORDER — TIZANIDINE HCL 2 MG PO TABS
2.0000 mg | ORAL_TABLET | Freq: Once | ORAL | Status: AC
  Administered 2018-06-11: 2 mg via ORAL
  Filled 2018-06-10: qty 1

## 2018-06-10 MED ORDER — SODIUM CHLORIDE 0.9 % IV SOLN
INTRAVENOUS | Status: DC
  Administered 2018-06-12: 09:00:00 via INTRAVENOUS

## 2018-06-10 NOTE — Progress Notes (Signed)
Physical Therapy Session Note  Patient Details  Name: Erika Fry MRN: [id number] Date of Birth: [birth year]  Today's Date: 06/10/2018 PT Individual Time:1500-1545 45 min      Short Term Goals: Week 3:  PT Short Term Goal 1 (Week 3): Pt will perform sit<>stand with max assist of 1 outside of Stedy PT Short Term Goal 2 (Week 3): Pt will initiate gait training  PT Short Term Goal 3 (Week 3): Pt will maintain standing tolerance x 2 min to prepare for ambulation PT Short Term Goal 4 (Week 3): Pt will perform bed mobility with mod assist  PT Short Term Goal 5 (Week 3): Pt will perform bed<>chair transfer with max assist of 1 consistently and LRAD   Skilled Therapeutic Interventions/Progress Updates:   Pt received supine in bed and agreeable to PT. semirecumbent>sit transfer with min-mod assist from PT and moderate cues for safety.   SB transfer to [facility] with min assist from PT for improved pelvic mobility and min cues for UE placement and improved use of BUE to lift and scoot across SB.   Sit<>stand transfer in parallel bars x 2 with mod assist from PT. Pt required to pull on rails into standing.   Gait training in paraller bars 27f forward/reverse and 391fft forward/reverse with mod assist from PT for safety and stability. PT required to block stance.   [facility]<>[facility] from PT x 2 with 1 UE to [facility] on the RW. Max cues for anterior weight shift.   Pt returned to room and performed SB transfer to bed with min assist from PT. Sit>supine completed with moderate assist to manage BLE and left supine in bed with call bell in reach and all needs met.         Therapy Documentation Precautions:  Precautions Precautions: Fall Precaution Comments: Pacemaker placement 4 weeks ago Required Braces or Orthoses: Spinal Brace Spinal Brace: Lumbar corset Restrictions Weight Bearing Restrictions: No Pain: Pain Assessment Pain Scale: 0-10 Pain Score: 7  Pain  Type: Chronic pain Pain Location: Hip Pain Orientation: Left Pain Radiating Towards: (Back) Pain Descriptors / Indicators: Aching Pain Frequency: Constant Pain Onset: On-going Patients Stated Pain Goal: 2 Pain Intervention(s): Medication (See eMAR);Repositioned Multiple Pain Sites: No    See Function Navigator for Current Functional Status.   Therapy/Group: Individual Therapy  AuLorie Phenix/09/2018, 11:31 AM

## 2018-06-10 NOTE — Progress Notes (Signed)
Physical Therapy Session Note  Patient Details  Name: Erika Fry MRN: [id number] Date of Birth: January 30, 1946  Today's Date: 06/10/2018 PT Individual Time: 1103-1203 PT Individual Time Calculation (min): 60 min   Short Term Goals:  Week 3:  PT Short Term Goal 1 (Week 3): Pt will perform sit<>stand with max assist of 1 outside of Stedy PT Short Term Goal 2 (Week 3): Pt will initiate gait training  PT Short Term Goal 3 (Week 3): Pt will maintain standing tolerance x 2 min to prepare for ambulation PT Short Term Goal 4 (Week 3): Pt will perform bed mobility with mod assist  PT Short Term Goal 5 (Week 3): Pt will perform bed<>chair transfer with max assist of 1 consistently and LRAD   Skilled Therapeutic Interventions/Progress Updates:   Pt sitting up in regular w/c.  STedy w/c> mat to R with max/total assist.  PT readjusted LSO lower while pt sitting on seat of Stedy.  Stedy> mat with mod assist.  Seated boosts using Yoga blocks, mat slightly raises and bil feet supported, x 5.  Pt stated she is very tired from not sleeping well last night.  She needed rest break between each boost.  PT educated pt on diaphragmatic breathing with LSO loosened, using quick sniffs to activate the diaphragm muscle, with good results with practice.    Reciprocal scooting forward/backward on mat with feet supported; limited by R hip pain.   Use of Stedy to return to w/c.  Pt left resting in w/c with Candice, NT attending to pt.     Therapy Documentation Precautions:  Precautions Precautions: Fall Precaution Comments: Pacemaker placement 4 weeks ago Required Braces or Orthoses: Spinal Brace Spinal Brace: Lumbar corset Restrictions Weight Bearing Restrictions: No General:   Vital Signs: Therapy Vitals O2 sats on room air = 93 %  Pulse Rate: 88  Pain: Pain Assessment Pain Scale: 0-10 Pain Score: 6 Pain Type: Chronic pain Pain Location: Hip Pain Orientation: Left Pain Radiating Towards:  (Back) Pain Descriptors / Indicators: Aching Pain Frequency: Constant Pain Onset: On-going Patients Stated Pain Goal: 2 Pain Intervention(s): Medication (See eMAR);Repositioned Multiple Pain Sites: No      See Function Navigator for Current Functional Status.   Therapy/Group: Individual Therapy  Jylan Loeza 06/10/2018, 12:11 PM

## 2018-06-10 NOTE — Progress Notes (Signed)
Occupational Therapy Weekly Progress Note  Patient Details  Name: Erika Fry MRN: [id number] Date of Birth: 1946-03-04  Beginning of progress report period: June 01, 2018 End of progress report period: June 10, 2018  Today's Date: 06/10/2018 OT Individual Time: 0786-7544 OT Individual Time Calculation (min): 45 min    Patient has met 4 of 4 short term goals.  Pt has made good progress with the use of her shoulders to be able to don her shirt, improved bed mobility to sit to EOB with min A, improved sitting balance at EOB with S, and improved sit to stand in the stedy lift.  She is unable to stand without the stedy due to LE weakness and hip pain.  She can use the stedy lift with mod-max A but has severely limited standing tolerance without the hip pads (less than 5 seconds).  She has been doing very well with use of the sliding board with only CGA needed using scoots vs. Slides. Due to limited standing skills, LTGs have been downgraded.  Patient continues to demonstrate the following deficits: muscle weakness and muscle joint tightness, decreased cardiorespiratoy endurance and decreased oxygen support, decreased memory and decreased standing balance and therefore will continue to benefit from skilled OT intervention to enhance overall performance with BADL.  Patient not progressing toward long term goals.  See goal revision..  Plan of care revisions: standing balance downgraded to max A..  Sit to stand, LB dressing and toileting skills downgraded to mod A. Shower stall transfer goal discontinued.    OT Short Term Goals Week 1:  OT Short Term Goal 1 (Week 1): Pt will report no more than 5/10 pain in order to participate more functionally in ADLs OT Short Term Goal 1 - Progress (Week 1): Not met OT Short Term Goal 2 (Week 1): Pt will sit EOB for >10 min with no more than mod A to participate in morning ADL routine OT Short Term Goal 2 - Progress (Week 1): Not met OT Short Term Goal 3  (Week 1): Pt will don shirt with min A  OT Short Term Goal 3 - Progress (Week 1): Not met OT Short Term Goal 4 (Week 1): Pt will adhere to back precautions during bed mobility with no more than 2 vc  OT Short Term Goal 4 - Progress (Week 1): Met Week 2:  OT Short Term Goal 1 (Week 2): Pt will move from sidelying to sit with mod A of 1 to prepare for bathing/dressing EOB or in wc. OT Short Term Goal 1 - Progress (Week 2): Met OT Short Term Goal 2 (Week 2): Pt will maintain static sitting EOB with min A for 5-10 min. OT Short Term Goal 2 - Progress (Week 2): Met OT Short Term Goal 3 (Week 2): Pt will don shirt with min A. OT Short Term Goal 3 - Progress (Week 2): Met OT Short Term Goal 4 (Week 2): Pt will sit to stand in bariatric Stedy with mod A of 1 to prepare for transfer to w/c. OT Short Term Goal 4 - Progress (Week 2): Met Week 3:  OT Short Term Goal 1 (Week 3): STGs = LTGs (LTGs downgraded 06/10/18)  Skilled Therapeutic Interventions/Progress Updates:    Pt received in bed on 1L of O2.  Pt stated she was already bathed this am.  To set up for LB dressing, pt was repositioned in bed with mod A.  HOB raised and pt worked on donning pants over feet with  reacher by actively lifting and flexing knees.  Mod cues for use of reacher and pt needed extra time.  She then moved into supine to roll with handrails with S for therapist to pull pants over hips.  She sat to EOB from sidelying to sit with min A and then maintained balance in sitting. Brace applied.  Attempted sit to stand 3x with Stedy lift with pt needing max A each time.  Pt informed me that she has been able to stand with stedy with much less A yesterday.   She then used sliding board with scoots with only CGA using good push through her legs to elevate hips.  Pt adjusted in chair and then set up at sink for grooming.  Nurse tech aware pt at sink to check on her as her call light would not reach that far.    Therapy  Documentation Precautions:  Precautions Precautions: Fall Precaution Comments: Pacemaker placement 4 weeks ago Required Braces or Orthoses: Spinal Brace Spinal Brace: Lumbar corset Restrictions Weight Bearing Restrictions: No  Pain: Pain Assessment Pain Scale: 0-10 Pain Score: 7  Pain Type: Chronic pain Pain Location: Hip Pain Orientation: Left Pain Radiating Towards: (Back) Pain Descriptors / Indicators: Aching Pain Frequency: Constant Pain Onset: On-going Patients Stated Pain Goal: 2 Pain Intervention(s): Medication (See eMAR);Repositioned Multiple Pain Sites: No ADL: ADL ADL Comments: See functional navigator  See Function Navigator for Current Functional Status.   Therapy/Group: Individual Therapy  [place] 06/10/2018, 12:18 PM

## 2018-06-10 NOTE — Progress Notes (Signed)
Subjective/Complaints: Pt seen sitting up in bed this AM.  She states she did not sleep well overnight due to SOB, but that improved after she had BM.  She states she is making daily progress in therapies.   ROS: Denies CP, S OB, nausea, vomiting diarrhea.  Objective: Vital Signs: Blood pressure 128/69, pulse 78, temperature (!) 97.5 F (36.4 C), temperature source Oral, resp. rate 18, height _0  (1.651 m), weight 93.9 kg (207 lb), SpO2 98 %. No results found. Results for orders placed or performed during the hospital encounter of 05/22/18 (from the past 72 hour(s))  Glucose, capillary     Status: Abnormal   Collection Time: 06/07/18 11:24 AM  Result Value Ref Range   Glucose-Capillary 176 (H) 70 - 99 mg/dL  Glucose, capillary     Status: Abnormal   Collection Time: 06/07/18  4:23 PM  Result Value Ref Range   Glucose-Capillary 172 (H) 70 - 99 mg/dL  Glucose, capillary     Status: Abnormal   Collection Time: 06/07/18  9:05 PM  Result Value Ref Range   Glucose-Capillary 187 (H) 70 - 99 mg/dL  Basic metabolic panel     Status: Abnormal   Collection Time: 06/08/18  4:00 AM  Result Value Ref Range   Sodium 136 135 - 145 mmol/L   Potassium 4.8 3.5 - 5.1 mmol/L   Chloride 98 98 - 111 mmol/L    Comment: Please note change in reference range.   CO2 29 22 - 32 mmol/L   Glucose, Bld 233 (H) 70 - 99 mg/dL    Comment: Please note change in reference range.   BUN 49 (H) 8 - 23 mg/dL    Comment: Please note change in reference range.   Creatinine, Ser 2.26 (H) 0.44 - 1.00 mg/dL   Calcium 9.2 8.9 - 10.3 mg/dL   GFR calc non Af Amer 20 (L) >60 mL/min   GFR calc Af Amer 23 (L) >60 mL/min    Comment: (NOTE) The eGFR has been calculated using the CKD EPI equation. This calculation has not been validated in all clinical situations. eGFR's persistently <60 mL/min signify possible Chronic Kidney Disease.    Anion gap 9 5 - 15    Comment: Performed at [facility] [address] [place], [place] [address]  CBC with Differential/Platelet     Status: Abnormal   Collection Time: 06/08/18  4:00 AM  Result Value Ref Range   WBC 12.6 (H) 4.0 - 10.5 K/uL   RBC 2.83 (L) 3.87 - 5.11 MIL/uL   Hemoglobin 8.2 (L) 12.0 - 15.0 g/dL   HCT 26.5 (L) 36.0 - 46.0 %   MCV 93.6 78.0 - 100.0 fL   MCH 29.0 26.0 - 34.0 pg   MCHC 30.9 30.0 - 36.0 g/dL   RDW 15.5 11.5 - 15.5 %   Platelets 319 150 - 400 K/uL   Neutrophils Relative % 94 %   Lymphocytes Relative 5 %   Monocytes Relative 1 %   Eosinophils Relative 0 %   Basophils Relative 0 %   Neutro Abs 11.9 (H) 1.7 - 7.7 K/uL   Lymphs Abs 0.6 (L) 0.7 - 4.0 K/uL   Monocytes Absolute 0.1 0.1 - 1.0 K/uL   Eosinophils Absolute 0.0 0.0 - 0.7 K/uL   Basophils Absolute 0.0 0.0 - 0.1 K/uL   RBC Morphology POLYCHROMASIA PRESENT     Comment: Performed at [facility], [address] [place], [place] [address]  Glucose, capillary     Status: Abnormal   Collection Time: 06/08/18  6:19 AM  Result Value Ref Range   Glucose-Capillary 215 (H) 70 - 99 mg/dL   Comment 1 Notify RN   Glucose, capillary     Status: Abnormal   Collection Time: 06/08/18 11:57 AM  Result Value Ref Range   Glucose-Capillary 254 (H) 70 - 99 mg/dL  Glucose, capillary     Status: Abnormal   Collection Time: 06/08/18  4:35 PM  Result Value Ref Range   Glucose-Capillary 235 (H) 70 - 99 mg/dL  Glucose, capillary     Status: Abnormal   Collection Time: 06/08/18  9:12 PM  Result Value Ref Range   Glucose-Capillary 303 (H) 70 - 99 mg/dL  Glucose, capillary     Status: Abnormal   Collection Time: 06/09/18  6:44 AM  Result Value Ref Range   Glucose-Capillary 246 (H) 70 - 99 mg/dL  Glucose, capillary     Status: Abnormal   Collection Time: 06/09/18 12:05 PM  Result Value Ref Range   Glucose-Capillary 265 (H) 70 - 99 mg/dL  Glucose, capillary     Status: Abnormal   Collection Time: 06/09/18  5:30 PM  Result Value Ref Range   Glucose-Capillary 316 (H) 70 - 99  mg/dL  Glucose, capillary     Status: Abnormal   Collection Time: 06/09/18  9:09 PM  Result Value Ref Range   Glucose-Capillary 303 (H) 70 - 99 mg/dL   Comment 1 Notify RN   Glucose, capillary     Status: Abnormal   Collection Time: 06/10/18  6:33 AM  Result Value Ref Range   Glucose-Capillary 304 (H) 70 - 99 mg/dL     Constitutional: No distress . Vital signs reviewed. HENT: Normocephalic.  Atraumatic. Eyes: EOMI. No discharge. Cardiovascular: RRR. No JVD. Respiratory: CTA bilaterally. Normal effort. +[place]. GI: BS +. Non-distended. Musc: Generalized edema, increased with steroids Neuro: Alert Motor:  Bilateral upper extremities: 4+/5 proximal distal (unchanged) Bilateral lower extremities: Hip flexion 3-/5, knee extension 3/5, ankle dorsiflexion 5/5 (umchanged)  Skin: Warm and dry. Intact. Psych: Normal mood.  Normal behavior.  Assessment/Plan: 1. Functional deficits secondary to L4-5 Lumbar discitis which require 3+ hours per day of interdisciplinary therapy in a comprehensive inpatient rehab setting. Physiatrist is providing close team supervision and 24 hour management of active medical problems listed below. Physiatrist and rehab team continue to assess barriers to discharge/monitor patient progress toward functional and medical goals. FIM: Function - Bathing Bathing activity did not occur: Refused Position: Bed Body parts bathed by patient: Right arm, Left arm, Chest, Abdomen Body parts bathed by helper: Right lower leg, Left lower leg, Right upper leg, Left upper leg, Back, Front perineal area, Buttocks Assist Level: 2 helpers  Function- Upper Body Dressing/Undressing Upper body dressing/undressing activity did not occur: Refused What is the patient wearing?: Pull over shirt/dress Pull over shirt/dress - Perfomed by patient: Thread/unthread right sleeve, Thread/unthread left sleeve Pull over shirt/dress - Perfomed by helper: Put head through opening, Pull shirt over  trunk Orthosis activity level: Performed by helper Assist Level: Touching or steadying assistance(Pt > 75%) Function - Lower Body Dressing/Undressing Lower body dressing/undressing activity did not occur: Refused What is the patient wearing?: Pants, [employer], Shoes Position: Bed Pants- Performed by helper: Thread/unthread right pants leg, Thread/unthread left pants leg, Pull pants up/down Non-skid slipper socks- Performed by helper: Don/doff right sock, Don/doff left sock Shoes - Performed by helper: Don/doff right shoe, Don/doff left shoe TED  Hose - Performed by helper: Don/doff right TED hose, Don/doff left TED hose Assist for lower body dressing: 2 Helpers  Function - Toileting Toileting activity did not occur: Safety/medical concerns Toileting steps completed by helper: Performs perineal hygiene Assist level: Two helpers  Function - Air cabin crew transfer activity did not occur: Safety/medical concerns Toilet transfer assistive device: Bedside commode, Mechanical lift Mechanical lift: Stedy Assist level to toilet: Maximal assist (Pt 25 - 49%/lift and lower) Assist level from toilet: Maximal assist (Pt 25 - 49%/lift and lower) Assist level to bedside commode (at bedside): 2 helpers Assist level from bedside commode (at bedside): 2 helpers  Function - Chair/bed transfer Chair/bed transfer activity did not occur: Refused Chair/bed transfer method: Lateral scoot Chair/bed transfer assist level: Touching or steadying assistance (Pt > 75%) Chair/bed transfer assistive device: Sliding board Mechanical lift: Stedy Chair/bed transfer details: Verbal cues for precautions/safety, Verbal cues for technique, Verbal cues for sequencing, Tactile cues for weight shifting  Function - Locomotion: Wheelchair Type: Manual Wheelchair activity did not occur: Refused Max wheelchair distance: 125 Assist Level: Touching or steadying assistance (Pt > 75%) Wheel 50 feet with 2 turns  activity did not occur: Refused Assist Level: Touching or steadying assistance (Pt > 75%) Wheel 150 feet activity did not occur: Refused Function - Locomotion: Ambulation Ambulation activity did not occur: Safety/medical concerns Walk 10 feet activity did not occur: Safety/medical concerns Walk 50 feet with 2 turns activity did not occur: Safety/medical concerns Walk 150 feet activity did not occur: Safety/medical concerns Walk 10 feet on uneven surfaces activity did not occur: Safety/medical concerns  Function - Comprehension Comprehension: Auditory Comprehension assist level: Follows complex conversation/direction with extra time/assistive device  Function - Expression Expression: Verbal Expression assist level: Expresses basic needs/ideas: With extra time/assistive device  Function - Social Interaction Social Interaction assist level: Interacts appropriately with others with medication or extra time (anti-anxiety, antidepressant).  Function - Problem Solving Problem solving assist level: Solves basic 75 - 89% of the time/requires cueing 10 - 24% of the time  Function - Memory Memory assist level: Recognizes or recalls 75 - 89% of the time/requires cueing 10 - 24% of the time Patient normally able to recall (first 3 days only): Current season, Location of own room, Staff names and faces  Medical Problem List and Plan:  1. Functional deficits secondary to L4-5 osteomyelitis/discitis, lumbar spondylosis   Continue CIR  Xray ordered, reviewed stable 2. DVT Prophylaxis/Anticoagulation: Mechanical: Sequential compression devices, below knee Bilateral lower extremities   Doppler negative for DVT in LE  LUE doppler ordered to evaluate for DVT, negative 3. Pain Management: Scheduled oxycodone prior to therapy to help with activity tolerance, decreased to 5 mg on 6/27.   Added ultram prn for moderate pain. Monitor and titrate medications to help with neuropathic symptoms. Decadron d/ced.    RLE radicular pain, partially improved on gabapentin to 665m BID, decreased to 300 mg twice a day, DC'd on 6/27 due to lethargy and tremors. Gabapentin 100 3 times a day started on/3   Low dose dexamethasone DC'd on 6/26  Baclofen changed to 5 mg daily at bedtime, patient unable to tolerate higher doses, DC'd on 6/27 due to lethargy  Lidoderm patch added on 7/2  Overall improving 4. Mood: LCSW to follow for evaluation and support.  5. Neuropsych: This patient is capable of making decisions on her own behalf.  6. Skin/Wound Care: routine pressure relief measures  7. Fluids/Electrolytes/Nutrition: Monitor I/O.   8. Lumbar diskitis: Afebrile  on scheduled tylenol. IV Abx per ID.  9. Acute on chronic anemia: Treated with iron and procrit on outpatient basis. Baseline Hgb- 9-10 range? Will continue to monitor serially   Hemoglobin 8.2 on 7/8  Hemoccult reordered, remains pending  Continue to monitor 10. Symptomatic Bradycardia s/p PPM: Monitor HR bid.   Resolved 11. GIB with thrombocytopenia: Resolved  Cont to monitor 12. New diagnosis T2DM: Hgb A1c- 6.3. Poorly controlled due to recent rounds of steroids--Lantus was titrated upwards for better control. Was not on any medications at home. Monitor BS ac/hs--watch for hypoglycemic episodes as steroids tapered off.  CBG (last 3)  Recent Labs    06/09/18 1730 06/09/18 2109 06/10/18 0633  GLUCAP 316* 303* 304*   Elevated on 7/10 with steroids, will not make any further changes until steroids DC'd 13. Neuropathy BLE: Continue Gabapentin bid--monitor for SE.  14. Hyponatremia:   Sodium 136 on 7/8  Added protein supplements with meals. monitor for now.   Continue to monitor 15. Stage IV CKD: Baseline SCr 3.5-4.0. Monitor for SE/asterixis with narcotics.   Creatinine 2.26 on 7/8  Encourage fluids, IVF DC'd on 6/30  Continue to monitor 16. CHF  Monitor daily weights and for signs of overload.   Lasix increased to 80 mg daily on 6/27 (PTCA  80 twice a day), DC'd Filed Weights   06/08/18 0424 06/09/18 0604 06/10/18 0552  Weight: 93.2 kg (205 lb 6.4 oz) 94.8 kg (208 lb 15.9 oz) 93.9 kg (207 lb)   Relatively stable on 7/10 17. Morbid obesity  Encourage weight loss 18. Essential hypertension  Continue Norvasc 10 mg daily  Hydralazine 50 mg 3 times a day, increased to 75 on 6/24  Controlled on 7/10 19. Hypoalbuminemia  Supplement initiated 20. Hypokalemia  Potassium 4.8 on 7/8, supplement DC'd  Continue to monitor 21. AMS: Resolved  Likely medication induced  CXR reviewed, ECG reviewed, head CT reviewed, EEG performed - ? Borderline QTC prolongation  Discussed with pharmacy  TSH, free T4 within acceptable range  Free T3 low  Continues to have low grade fever  UA unremarkable.  Urine culture no growth  Leukocytosis with WBCs 12.6 on 7/8, likely steroid-induced  Blood cultures no growth  Appreciate ID recs 22. Hypothyroidism  Continue meds   See #21 23.  Sleep disturbance  Melatonin initiated on 6/30, increased on 7/1  Improving 24. HCAP versus Eosinophilic pneumonia  CXR showing multifocal disease  IV Abx per ID  Steroid burst per ID  Appreciate ID recs  Wean supplemental O2 as tolerated  LOS (Days) 19 A FACE TO FACE EVALUATION WAS PERFORMED  Erika Fry [name] 06/10/2018, 8:15 AM

## 2018-06-10 NOTE — Progress Notes (Signed)
[facility] for Infectious Disease  Date of Admission:  05/22/2018   Total days of antibiotics 28        Day 4: Ceftaroline                ASSESSMENT: Ms. Erika Fry is a 72 y.o female who presented with lumbar discitis on admission and developed shortness of breath and patchy pneumonia on chest xray about ten days after admission. She is on day 28 of antibiotic therapy for her culture negative lumbar discitis and suspected healthcare associated pneumonia.   Ms. Erika Fry reported increased SOB last night which improved after a bowel movement. She appeared more short of breath on exam. She is still having a dry cough but if she produces any sputum we would like to culture  We will continue monitoring her on steroids and ceftaroline but recommend a repeat chest xray to see if there is any improvement on current therapy. We recommend she still complete her antibiotic therapy for 14 more days.   PLAN: 1. Continue ceftaroline and steroid therapy 2. Order a repeat chest xray 3. Consider switching to oral linezolid and ceftriaxone if discharged home prior to completion of therapy (14 more days of antibiotic therapy)  Principal Problem:   Lumbar discitis Active Problems:   CKD (chronic kidney disease), stage IV (HCC)   HCAP (healthcare-associated pneumonia)   Diet-controlled diabetes mellitus ([place])   Iron deficiency anemia   Essential hypertension   Gastric polyp   Acute right-sided low back pain with right-sided sciatica   Radicular pain   Acute blood loss anemia   Anemia of chronic disease   Thrombocytopenia ([place])   New onset type 2 diabetes mellitus ([place])   Morbid obesity (HCC)   Hyponatremia   Benign essential HTN   Hypoalbuminemia due to protein-calorie malnutrition (HCC)   Edema of hand   Hyperkalemia   SOB (shortness of breath)   Somnolence   Delirium   Sleep disturbance   Hypertensive crisis   Hypokalemia   Generalized edema   Steroid-induced  hyperglycemia   Scheduled Meds: . acetaminophen  650 mg Oral TID AC & HS  . allopurinol  200 mg Oral Daily  . amLODipine  10 mg Oral Daily  . Chlorhexidine Gluconate Cloth  6 each Topical Q0600  . ferrous sulfate  325 mg Oral BID WC  . folic acid  1 mg Oral Daily  . gabapentin  100 mg Oral TID  . hydrALAZINE  75 mg Oral TID  . insulin aspart  0-5 Units Subcutaneous QHS  . insulin aspart  0-9 Units Subcutaneous TID WC  . insulin glargine  20 Units Subcutaneous Daily  . levothyroxine  75 mcg Oral QAC breakfast  . lidocaine  1 patch Transdermal Q24H  . loratadine  10 mg Oral Daily  . Melatonin  1.5 mg Oral QHS  . multivitamin with minerals   Oral Daily  . oxyCODONE  5 mg Oral BID WC  . pantoprazole  40 mg Oral Q0600  . predniSONE  50 mg Oral Q breakfast   Followed by  . [START ON 06/12/2018] predniSONE  40 mg Oral Q breakfast   Followed by  . [START ON 06/14/2018] predniSONE  30 mg Oral Q breakfast   Followed by  . [START ON 06/16/2018] predniSONE  20 mg Oral Q breakfast   Followed by  . [START ON 06/18/2018] predniSONE  10 mg Oral Q breakfast   Followed by  . [  START ON 06/20/2018] predniSONE  5 mg Oral Q breakfast  . protein supplement shake  11 oz Oral TID  . senna  1 tablet Oral Daily  . simvastatin  40 mg Oral QHS  . vitamin B-12  1,000 mcg Oral Daily   Continuous Infusions: . sodium chloride Stopped (06/08/18 2124)  . ceFTAROline [facility]) IV 300 mg (06/09/18 2052)   PRN Meds:.acetaminophen, acetaminophen, alum & mag hydroxide-simeth, bisacodyl, diphenhydrAMINE, fluticasone, guaiFENesin-dextromethorphan, naLOXone (NARCAN)  injection, oxyCODONE, polyethylene glycol, prochlorperazine **OR** prochlorperazine **OR** prochlorperazine, sodium chloride flush, sodium phosphate, traMADol, traZODone   SUBJECTIVE: Ms. Erika Fry was examined sitting up in her physical therapy chair this morning. She reported worsened SOB last night that did not allow her to sleep. She said she was having  trouble breathing even with her nasal cannula. She is still having a dry cough and states she is feeling worse than yesterday. Her back pain has improved and she said she is making progress in therapy; however, she expressed concern about her breathing. She denied chest pain, fevers, or chest tightness. She feels slightly better than she did last night but is still feeling SOB.    Review of Systems: Review of Systems  Constitutional: Negative for chills and fever.  Respiratory: Positive for cough and shortness of breath. Negative for sputum production and wheezing.   Cardiovascular: Negative for chest pain.  Musculoskeletal: Positive for back pain.    Allergies  Allergen Reactions  . Codeine Other (See Comments)    Increases Pain and couldn't sleep    OBJECTIVE: Vitals:   06/09/18 2014 06/09/18 2046 06/09/18 2100 06/10/18 0552  BP: 138/68 132/66  128/69  Pulse: 87   78  Resp: 19   18  Temp: 98.2 F (36.8 C)   (!) 97.5 F (36.4 C)  TempSrc: Oral   Oral  SpO2: 96%  98% 98%  Weight:    93.9 kg (207 lb)  Height:       Body mass index is 34.45 kg/m.  Physical Exam  Cardiovascular: Normal rate and regular rhythm.  Pulmonary/Chest: Breath sounds normal.  Patient became a little SOB while being interviewed    Lab Results Lab Results  Component Value Date   WBC 12.6 (H) 06/08/2018   HGB 8.2 (L) 06/08/2018   HCT 26.5 (L) 06/08/2018   MCV 93.6 06/08/2018   PLT 319 06/08/2018    Lab Results  Component Value Date   CREATININE 2.26 (H) 06/08/2018   BUN 49 (H) 06/08/2018   NA 136 06/08/2018   K 4.8 06/08/2018   CL 98 06/08/2018   CO2 29 06/08/2018    Lab Results  Component Value Date   ALT 35 05/29/2018   AST 77 (H) 05/29/2018   ALKPHOS 102 05/29/2018   BILITOT 0.6 05/29/2018     Microbiology: No results found for this or any previous visit (from the past 240 hour(s)).   Areeg Dutch Quint, [facility]  06/10/2018, 9:55 AM

## 2018-06-11 ENCOUNTER — Inpatient Hospital Stay (HOSPITAL_COMMUNITY): Payer: Medicare Other | Admitting: Physical Therapy

## 2018-06-11 ENCOUNTER — Inpatient Hospital Stay (HOSPITAL_COMMUNITY): Payer: Medicare Other | Admitting: Occupational Therapy

## 2018-06-11 LAB — BASIC METABOLIC PANEL
ANION GAP: 12 (ref 5–15)
BUN: 95 mg/dL — AB (ref 8–23)
CO2: 26 mmol/L (ref 22–32)
Calcium: 9.4 mg/dL (ref 8.9–10.3)
Chloride: 94 mmol/L — ABNORMAL LOW (ref 98–111)
Creatinine, Ser: 2.98 mg/dL — ABNORMAL HIGH (ref 0.44–1.00)
GFR calc Af Amer: 17 mL/min — ABNORMAL LOW (ref >60)
GFR calc non Af Amer: 14 mL/min — ABNORMAL LOW (ref >60)
GLUCOSE: 311 mg/dL — AB (ref 70–99)
POTASSIUM: 5.7 mmol/L — AB (ref 3.5–5.1)
Sodium: 132 mmol/L — ABNORMAL LOW (ref 135–145)

## 2018-06-11 LAB — GLUCOSE, CAPILLARY
GLUCOSE-CAPILLARY: 252 mg/dL — AB (ref 70–99)
GLUCOSE-CAPILLARY: 333 mg/dL — AB (ref 70–99)
GLUCOSE-CAPILLARY: 374 mg/dL — AB (ref 70–99)
GLUCOSE-CAPILLARY: 367 mg/dL — AB (ref 70–99)
Glucose-Capillary: 274 mg/dL — ABNORMAL HIGH (ref 70–99)

## 2018-06-11 LAB — OCCULT BLOOD X 1 CARD TO LAB, STOOL: Fecal Occult Bld: NEGATIVE

## 2018-06-11 MED ORDER — SODIUM POLYSTYRENE SULFONATE 15 GM/60ML PO SUSP
30.0000 g | Freq: Once | ORAL | Status: AC
  Administered 2018-06-11: 30 g via ORAL
  Filled 2018-06-11: qty 120

## 2018-06-11 NOTE — Progress Notes (Signed)
Occupational Therapy Session Note  Patient Details  Name: Erika Fry MRN: [id number] Date of Birth: September 19, 1946  Today's Date: 06/11/2018 OT Individual Time: 1510-1610 OT Individual Time Calculation (min): 60 min    Short Term Goals: Week 3:  OT Short Term Goal 1 (Week 3): STGs = LTGs (LTGs downgraded 06/10/18)  Skilled Therapeutic Interventions/Progress Updates:    Treatment session with focus on LB dressing and sit > stand to decrease burden of care.  Pt received upright in w/c reporting doing much more in therapies this date.  Discussed current LB dressing technique at bed level and questioned how pt will complete clothing when toileting.  Pt expressed desire to complete at standing level.  Educated on lateral leans to complete LB dressing to increase independence with pt willing to attempt.  Completed slide board transfer to therapy mat with min assist and cues for weight shift.  Engaged in simulated LB dressing with lateral leans with use of theraband.  Pt able to pull elastic up/down over hips with multiple leans, reporting pleased with ability despite feeling fatigued.  Engaged in sit > stand with RW with +2 for safety progressing from +2 for lifting to mod assist of 1 with 2nd person stabilizing RW.  Pt able to tolerate standing ~5 seconds before needing to return to sitting.  Completed sit > stand x3.  Returned to w/c via slide board with increased independence, min guard during transfer.  Upon return to bed, pt completed sit > stand in [place] with improved weight shift and transferred back to bed with assist to lift BLE into bed.   Therapy Documentation Precautions:  Precautions Precautions: Fall Precaution Comments: Pacemaker placement 4 weeks ago Required Braces or Orthoses: Spinal Brace Spinal Brace: Lumbar corset Restrictions Weight Bearing Restrictions: No General:   Vital Signs: Therapy Vitals Pulse Rate: 81 Resp: 20 BP: (!) 143/61 Patient Position (if  appropriate): Sitting Oxygen Therapy SpO2: 97 % O2 Device: Room Air Pain: Pain Assessment Pain Score: 5   See Function Navigator for Current Functional Status.   Therapy/Group: Individual Therapy  Simonne Come 06/11/2018, 4:13 PM

## 2018-06-11 NOTE — Progress Notes (Signed)
[facility]  Date of Admission:  05/22/2018   Total days of antibiotics 29        Day 5: Ceftaroline           ASSESSMENT: Erika Fry is a 72 y.o female who presented with lumbar discitis on admission and developed shortness of breath and patchy pneumonia on xray about ten days after admission. It is still not certain what is causing her pulmonary infiltrates. She is on day 29 of antibiotic therapy; ceftaroline is covering her culture negative lumbar discitis and possible healthcare associated pneumonia.  Erika Fry is no longer on supplemental oxygen and denied feeling any SOB or cough. A repeat chest xray on 06/10/18 showed some improvement of the lung infiltrate. She said she is making progress in therapy and her back is also improving. We plan to continue ceftaroline through Saturday and then recommend switching her to oral linezolid and ceftriaxone therapy until 06/17/18. If she is planned to be discharged before then we would recommend removing her PICC and discontinuing antibiotic therapy.   PLAN: 1. Continue ceftaroline treatment until Saturday 06/13/18 2. Switch to oral linezolid and ceftriaxone treatment on 06/14/18 until 06/17/18 3. Discontinue antibiotic therapy and remove PICC line if discharged sooner   Principal Problem:   Lumbar discitis Active Problems:   CKD (chronic kidney disease), stage IV (HCC)   HCAP (healthcare-associated pneumonia)   Diet-controlled diabetes mellitus ([place])   Iron deficiency anemia   Essential hypertension   Gastric polyp   Acute right-sided low back pain with right-sided sciatica   Radicular pain   Acute blood loss anemia   Anemia of chronic disease   Thrombocytopenia ([place])   New onset type 2 diabetes mellitus ([place])   Morbid obesity (HCC)   Hyponatremia   Benign essential HTN   Hypoalbuminemia due to protein-calorie malnutrition (HCC)   Edema of hand   Hyperkalemia   SOB (shortness of breath)  Somnolence   Delirium   Sleep disturbance   Hypertensive crisis   Hypokalemia   Generalized edema   Steroid-induced hyperglycemia   Scheduled Meds: . acetaminophen  650 mg Oral TID AC & HS  . allopurinol  200 mg Oral Daily  . amLODipine  10 mg Oral Daily  . Chlorhexidine Gluconate Cloth  6 each Topical Q0600  . ferrous sulfate  325 mg Oral BID WC  . folic acid  1 mg Oral Daily  . gabapentin  100 mg Oral TID  . hydrALAZINE  75 mg Oral TID  . insulin aspart  0-5 Units Subcutaneous QHS  . insulin aspart  0-9 Units Subcutaneous TID WC  . insulin glargine  20 Units Subcutaneous Daily  . levothyroxine  75 mcg Oral QAC breakfast  . lidocaine  1 patch Transdermal Q24H  . loratadine  10 mg Oral Daily  . Melatonin  1.5 mg Oral QHS  . multivitamin with minerals   Oral Daily  . oxyCODONE  5 mg Oral BID WC  . pantoprazole  40 mg Oral Q0600  . [START ON 06/12/2018] predniSONE  40 mg Oral Q breakfast   Followed by  . [START ON 06/14/2018] predniSONE  30 mg Oral Q breakfast   Followed by  . [START ON 06/16/2018] predniSONE  20 mg Oral Q breakfast   Followed by  . [START ON 06/18/2018] predniSONE  10 mg Oral Q breakfast   Followed by  . [START ON 06/20/2018] predniSONE  5 mg Oral Q  breakfast  . protein supplement shake  11 oz Oral TID  . senna  1 tablet Oral Daily  . simvastatin  40 mg Oral QHS  . vitamin B-12  1,000 mcg Oral Daily   Continuous Infusions: . sodium chloride Stopped (06/10/18 1036)  . sodium chloride Stopped (06/10/18 1038)  . ceFTAROline (TEFLARO) IV 300 mg (06/11/18 1226)   PRN Meds:.acetaminophen, acetaminophen, alum & mag hydroxide-simeth, bisacodyl, diphenhydrAMINE, fluticasone, guaiFENesin-dextromethorphan, naLOXone (NARCAN)  injection, oxyCODONE, polyethylene glycol, prochlorperazine **OR** prochlorperazine **OR** prochlorperazine, sodium chloride flush, sodium phosphate, traMADol, traZODone   SUBJECTIVE: Erika Fry was seen and evaluated at the bedside today. She  reported feeling much better, having no shortness of breath or cough. She is no longer on supplemental oxygen and even stated she was able to complete physical therapy without it. Her back is improving and she reported she is making progress in therapy. She denied chest pain or tightness.   Review of Systems: Review of Systems  Constitutional: Negative for chills and fever.  Respiratory: Negative for cough, sputum production, shortness of breath and wheezing.   Cardiovascular: Negative for chest pain and orthopnea.  Musculoskeletal: Positive for back pain.    Allergies  Allergen Reactions  . Codeine Other (See Comments)    Increases Pain and couldn't sleep    OBJECTIVE: Vitals:   06/10/18 2001 06/10/18 2128 06/11/18 0422 06/11/18 0801  BP: 128/70 (!) 148/67 (!) 147/69 (!) 136/100  Pulse: 92  79 81  Resp: 17  18 20   Temp: 98.3 F (36.8 C)  98 F (36.7 C)   TempSrc:      SpO2: 95%  94% 94%  Weight:   95.7 kg (210 lb 15.7 oz)   Height:       Body mass index is 35.11 kg/m.  Physical Exam  Constitutional: She is oriented to person, place, and time.  Cardiovascular: Normal rate, regular rhythm, normal heart sounds and intact distal pulses.  Pulmonary/Chest: Effort normal and breath sounds normal.  Neurological: She is alert and oriented to person, place, and time.  Psychiatric: She has a normal mood and affect.    Lab Results Lab Results  Component Value Date   WBC 12.6 (H) 06/08/2018   HGB 8.2 (L) 06/08/2018   HCT 26.5 (L) 06/08/2018   MCV 93.6 06/08/2018   PLT 319 06/08/2018    Lab Results  Component Value Date   CREATININE 2.98 (H) 06/11/2018   BUN 95 (H) 06/11/2018   NA 132 (L) 06/11/2018   K 5.7 (H) 06/11/2018   CL 94 (L) 06/11/2018   CO2 26 06/11/2018    Lab Results  Component Value Date   ALT 35 05/29/2018   AST 77 (H) 05/29/2018   ALKPHOS 102 05/29/2018   BILITOT 0.6 05/29/2018     Microbiology: No results found for this or any previous visit  (from the past 240 hour(s)).  [facility]  06/11/2018, 1:30 PM

## 2018-06-11 NOTE — Progress Notes (Signed)
Subjective/Complaints: Patient seen lying in bed this morning. She states she slept well overnight. She has questions about her chest x-ray and her antibiotics. Signal longer requiring supplemental oxygen.  ROS: denies CP, S OB, nausea, vomiting diarrhea.  Objective: Vital Signs: Blood pressure (!) 136/100, pulse 81, temperature 98 F (36.7 C), resp. rate 20, height '5\' 5"'$  (1.651 m), weight 95.7 kg (210 lb 15.7 oz), SpO2 94 %. Dg Chest 1 View  Result Date: 06/10/2018 CLINICAL DATA:  Episodic shortness of breath today. Possible pneumonia. EXAM: CHEST  1 VIEW COMPARISON:  06/07/2018 and 05/28/2018 radiographs. FINDINGS: 1407 hour. Right IJ central venous catheter extends to the lower right atrial level, stable. Left subclavian pacemaker leads appear unchanged. There is stable mild cardiomegaly and aortic atherosclerosis. Left-greater-than-right basilar opacities and a probable small left pleural effusion have not significantly changed. Right apical opacity has slightly improved. There is no pneumothorax. The bones appear unchanged. IMPRESSION: No significant change in left-greater-than-right basilar airspace opacities and small left pleural effusion. Right apical opacity has slightly improved over the last 3 days. Electronically Signed   By: Richardean Sale M.D.   On: 06/10/2018 14:18   Results for orders placed or performed during the hospital encounter of 05/22/18 (from the past 72 hour(s))  Glucose, capillary     Status: Abnormal   Collection Time: 06/08/18 11:57 AM  Result Value Ref Range   Glucose-Capillary 254 (H) 70 - 99 mg/dL  Glucose, capillary     Status: Abnormal   Collection Time: 06/08/18  4:35 PM  Result Value Ref Range   Glucose-Capillary 235 (H) 70 - 99 mg/dL  Glucose, capillary     Status: Abnormal   Collection Time: 06/08/18  9:12 PM  Result Value Ref Range   Glucose-Capillary 303 (H) 70 - 99 mg/dL  Glucose, capillary     Status: Abnormal   Collection Time: 06/09/18  6:44  AM  Result Value Ref Range   Glucose-Capillary 246 (H) 70 - 99 mg/dL  Glucose, capillary     Status: Abnormal   Collection Time: 06/09/18 12:05 PM  Result Value Ref Range   Glucose-Capillary 265 (H) 70 - 99 mg/dL  Glucose, capillary     Status: Abnormal   Collection Time: 06/09/18  5:30 PM  Result Value Ref Range   Glucose-Capillary 316 (H) 70 - 99 mg/dL  Glucose, capillary     Status: Abnormal   Collection Time: 06/09/18  9:09 PM  Result Value Ref Range   Glucose-Capillary 303 (H) 70 - 99 mg/dL   Comment 1 Notify RN   Glucose, capillary     Status: Abnormal   Collection Time: 06/10/18  6:33 AM  Result Value Ref Range   Glucose-Capillary 304 (H) 70 - 99 mg/dL  Glucose, capillary     Status: Abnormal   Collection Time: 06/10/18 12:01 PM  Result Value Ref Range   Glucose-Capillary 289 (H) 70 - 99 mg/dL  Glucose, capillary     Status: Abnormal   Collection Time: 06/10/18  4:59 PM  Result Value Ref Range   Glucose-Capillary 303 (H) 70 - 99 mg/dL  Glucose, capillary     Status: Abnormal   Collection Time: 06/10/18  9:18 PM  Result Value Ref Range   Glucose-Capillary 309 (H) 70 - 99 mg/dL  Basic metabolic panel     Status: Abnormal   Collection Time: 06/11/18  3:51 AM  Result Value Ref Range   Sodium 132 (L) 135 - 145 mmol/L   Potassium 5.7 (H) 3.5 -  5.1 mmol/L   Chloride 94 (L) 98 - 111 mmol/L    Comment: Please note change in reference range.   CO2 26 22 - 32 mmol/L   Glucose, Bld 311 (H) 70 - 99 mg/dL    Comment: Please note change in reference range.   BUN 95 (H) 8 - 23 mg/dL    Comment: Please note change in reference range.   Creatinine, Ser 2.98 (H) 0.44 - 1.00 mg/dL   Calcium 9.4 8.9 - 10.3 mg/dL   GFR calc non Af Amer 14 (L) >60 mL/min   GFR calc Af Amer 17 (L) >60 mL/min    Comment: (NOTE) The eGFR has been calculated using the CKD EPI equation. This calculation has not been validated in all clinical situations. eGFR's persistently <60 mL/min signify possible  Chronic Kidney Disease.    Anion gap 12 5 - 15    Comment: Performed at [facility] [address] [place], [place] [address]  Glucose, capillary     Status: Abnormal   Collection Time: 06/11/18  6:57 AM  Result Value Ref Range   Glucose-Capillary 274 (H) 70 - 99 mg/dL     Constitutional: No distress . Vital signs reviewed. HENT: Normocephalic.  Atraumatic. Eyes: EOMI. No discharge. Cardiovascular: RRR. No JVD. Respiratory: CTA bilaterally. Normal effort. GI: BS +. Non-distended. Musc: Generalized edema, increased with steroids Neuro: Alert Motor:  Bilateral upper extremities: 4+/5 proximal distal (unchanged) Bilateral lower extremities: Hip flexion 3-/5, knee extension 3/5, ankle dorsiflexion 5/5 (unchanged)  Skin: Warm and dry. Intact. Psych: Normal mood.  Normal behavior.  Assessment/Plan: 1. Functional deficits secondary to L4-5 Lumbar discitis which require 3+ hours per day of interdisciplinary therapy in a comprehensive inpatient rehab setting. Physiatrist is providing close team supervision and 24 hour management of active medical problems listed below. Physiatrist and rehab team continue to assess barriers to discharge/monitor patient progress toward functional and medical goals. FIM: Function - Bathing Bathing activity did not occur: Refused Position: Bed Body parts bathed by patient: Right arm, Left arm, Chest, Abdomen Body parts bathed by helper: Right lower leg, Left lower leg, Right upper leg, Left upper leg, Back, Front perineal area, Buttocks Assist Level: 2 helpers  Function- Upper Body Dressing/Undressing Upper body dressing/undressing activity did not occur: Refused What is the patient wearing?: Pull over shirt/dress Pull over shirt/dress - Perfomed by patient: Thread/unthread right sleeve, Thread/unthread left sleeve, Pull shirt over trunk Pull over shirt/dress - Perfomed by helper: Put head through opening Orthosis activity level: Performed by  helper Assist Level: Touching or steadying assistance(Pt > 75%) Function - Lower Body Dressing/Undressing Lower body dressing/undressing activity did not occur: Refused What is the patient wearing?: Pants, Ted Hose, Non-skid slipper socks Position: Bed Pants- Performed by patient: Thread/unthread right pants leg, Thread/unthread left pants leg(using reacher) Pants- Performed by helper: Pull pants up/down Non-skid slipper socks- Performed by helper: Don/doff right sock, Don/doff left sock Shoes - Performed by helper: Don/doff right shoe, Don/doff left shoe TED Hose - Performed by helper: Don/doff right TED hose, Don/doff left TED hose Assist for lower body dressing: 2 Helpers  Function - Toileting Toileting activity did not occur: Safety/medical concerns Toileting steps completed by helper: Performs perineal hygiene Assist level: Two helpers  Function - Air cabin crew transfer activity did not occur: Safety/medical concerns Toilet transfer assistive device: Bedside commode, Mechanical lift Mechanical lift: Stedy Assist level to toilet: Maximal assist (Pt 25 - 49%/lift and lower) Assist level from toilet: Maximal assist (Pt  25 - 49%/lift and lower) Assist level to bedside commode (at bedside): 2 helpers Assist level from bedside commode (at bedside): 2 helpers  Function - Chair/bed transfer Chair/bed transfer activity did not occur: Refused Chair/bed transfer method: Lateral scoot Chair/bed transfer assist level: Touching or steadying assistance (Pt > 75%) Chair/bed transfer assistive device: Sliding board, Armrests Mechanical lift: Stedy Chair/bed transfer details: Verbal cues for precautions/safety, Verbal cues for technique, Verbal cues for sequencing, Tactile cues for weight shifting, Manual facilitation for placement  Function - Locomotion: Wheelchair Type: Manual Wheelchair activity did not occur: Refused Max wheelchair distance: 150 Assist Level: Supervision or  verbal cues Wheel 50 feet with 2 turns activity did not occur: Refused Assist Level: Supervision or verbal cues Wheel 150 feet activity did not occur: Refused Assist Level: Supervision or verbal cues Function - Locomotion: Ambulation Ambulation activity did not occur: Safety/medical concerns Assistive device: Parallel bars Max distance: 102f Assist level: Moderate assist (Pt 50 - 74%) Walk 10 feet activity did not occur: Safety/medical concerns Walk 50 feet with 2 turns activity did not occur: Safety/medical concerns Walk 150 feet activity did not occur: Safety/medical concerns Walk 10 feet on uneven surfaces activity did not occur: Safety/medical concerns  Function - Comprehension Comprehension: Auditory Comprehension assist level: Follows complex conversation/direction with extra time/assistive device  Function - Expression Expression: Verbal Expression assist level: Expresses complex ideas: With extra time/assistive device  Function - Social Interaction Social Interaction assist level: Interacts appropriately with others with medication or extra time (anti-anxiety, antidepressant).  Function - Problem Solving Problem solving assist level: Solves complex 90% of the time/cues < 10% of the time  Function - Memory Memory assist level: Recognizes or recalls 90% of the time/requires cueing < 10% of the time Patient normally able to recall (first 3 days only): Current season, Location of own room, Staff names and faces  Medical Problem List and Plan:  1. Functional deficits secondary to L4-5 osteomyelitis/discitis, lumbar spondylosis   Continue CIR  Xray ordered, reviewed stable 2. DVT Prophylaxis/Anticoagulation: Mechanical: Sequential compression devices, below knee Bilateral lower extremities   Doppler negative for DVT in LE  LUE doppler ordered to evaluate for DVT, negative 3. Pain Management: Scheduled oxycodone prior to therapy to help with activity tolerance, decreased to 5  mg on 6/27.   Added ultram prn for moderate pain. Monitor and titrate medications to help with neuropathic symptoms. Decadron d/ced.   RLE radicular pain, partially improved on gabapentin to 6067mBID, decreased to 300 mg twice a day, DC'd on 6/27 due to lethargy and tremors. Gabapentin 100 3 times a day started on/3   Low dose dexamethasone DC'd on 6/26  Baclofen changed to 5 mg daily at bedtime, patient unable to tolerate higher doses, DC'd on 6/27 due to lethargy  Lidoderm patch added on 7/2  Overall improving 4. Mood: LCSW to follow for evaluation and support.  5. Neuropsych: This patient is capable of making decisions on her own behalf.  6. Skin/Wound Care: routine pressure relief measures  7. Fluids/Electrolytes/Nutrition: Monitor I/O.   8. Lumbar diskitis: Afebrile on scheduled tylenol. IV Abx per ID.  9. Acute on chronic anemia: Treated with iron and procrit on outpatient basis. Baseline Hgb- 9-10 range? Will continue to monitor serially   Hemoglobin 8.2 on 7/8  Hemoccult reordered, remains pending  Continue to monitor 10. Symptomatic Bradycardia s/p PPM: Monitor HR bid.   Resolved 11. GIB with thrombocytopenia: Resolved  Cont to monitor 12. New diagnosis T2DM: Hgb A1c- 6.3. Poorly  controlled due to recent rounds of steroids--Lantus was titrated upwards for better control. Was not on any medications at home. Monitor BS ac/hs--watch for hypoglycemic episodes as steroids tapered off.  CBG (last 3)  Recent Labs    06/10/18 1659 06/10/18 2118 06/11/18 0657  GLUCAP 303* 309* 274*   Elevated on 7/11 with steroids, will not make any further changes until steroids DC'd 13. Neuropathy BLE: Continue Gabapentin bid--monitor for SE.  14. Hyponatremia:   Sodium 132 on 7/11  Added protein supplements with meals. monitor for now.   Continue to monitor 15. Stage IV CKD: Baseline SCr 3.5-4.0. Monitor for SE/asterixis with narcotics.   Creatinine 2.98 on 7/11  Encourage fluids, IVF DC'd on  6/30  Continue to monitor 16. CHF  Monitor daily weights and for signs of overload.   Lasix increased to 80 mg daily on 6/27 (PTCA 80 twice a day), DC'd Filed Weights   06/09/18 0604 06/10/18 0552 06/11/18 0422  Weight: 94.8 kg (208 lb 15.9 oz) 93.9 kg (207 lb) 95.7 kg (210 lb 15.7 oz)   Relatively stable on 7/11 17. Morbid obesity  Encourage weight loss 18. Essential hypertension  Continue Norvasc 10 mg daily  Hydralazine 50 mg 3 times a day, increased to 75 on 6/24  Slightly labile, but overall controlled on 7/11 19. Hypoalbuminemia  Supplement initiated 20. Hypokalemia, now with hyperkalemia  Potassium 5.7 on 7/11, Kayexalate ordered  Labs ordered for tomorrow  Continue to monitor 21. AMS: Resolved  Likely medication induced  CXR reviewed, ECG reviewed, head CT reviewed, EEG performed - ? Borderline QTC prolongation  Discussed with pharmacy  TSH, free T4 within acceptable range  Free T3 low  Continues to have low grade fever  UA unremarkable.  Urine culture no growth  Blood cultures no growth 22. Hypothyroidism  Continue meds   See #21 23.  Sleep disturbance  Melatonin initiated on 6/30, increased on 7/1  Improving 24. HCAP versus Eosinophilic pneumonia  CXR showing multifocal disease  IV Abx per ID  Steroid burst per ID  Appreciate ID recs, notes reviewed, continue abx  Wean supplemental O2 as tolerated  Xray reviewed with patient, showing mild improvement 25. Leukocytosis   WBCs 12.6 on 7/8, likely steroid-induced + HCAP  Labs ordered for tomorrow  Appreciate ID recs  LOS (Days) 20 A FACE TO FACE EVALUATION WAS PERFORMED  Ankit [name] 06/11/2018, 8:29 AM

## 2018-06-11 NOTE — Patient Care Conference (Signed)
Inpatient RehabilitationTeam Conference and Plan of Care Update Date: 7/1102019   Time: 2:30 PM    Patient Name: Erika Fry      Medical Record Number: [id number]  Date of Birth: 1946/04/14 Sex: Female         Room/Bed: 4W12C/4W12C-01 Payor Info: Payor: [employer] MEDICARE / Plan: [facility] MEDICARE / Product Type: *No Product type* /    Admitting Diagnosis: Debility Osteomyelins  Admit Date/Time:  05/22/2018  2:57 PM Admission Comments: No comment available   Primary Diagnosis:  Lumbar discitis Principal Problem: Lumbar discitis  Patient Active Problem List   Diagnosis Date Noted  . Generalized edema   . Steroid-induced hyperglycemia   . HCAP (healthcare-associated pneumonia)   . Hypokalemia   . Hypertensive crisis   . Sleep disturbance   . Delirium   . SOB (shortness of breath)   . Somnolence   . Hyperkalemia   . Edema of hand   . Acute right-sided low back pain with right-sided sciatica   . Radicular pain   . Acute blood loss anemia   . Anemia of chronic disease   . Thrombocytopenia ([place])   . New onset type 2 diabetes mellitus ([place])   . Morbid obesity ([place])   . Hyponatremia   . Benign essential HTN   . Hypoalbuminemia due to protein-calorie malnutrition ([place])   . Occult blood in stools   . Gastric polyp   . Lumbar discitis 05/17/2018  . Blood loss anemia 05/12/2018  . Hyperlipidemia associated with type 2 diabetes mellitus ([place]) 05/10/2018  . Essential hypertension 04/29/2018  . CKD (chronic kidney disease), stage IV ([place])   . Diet-controlled diabetes mellitus ([place])   . GERD (gastroesophageal reflux disease)   . Gout   . High cholesterol   . Hypothyroidism   . Iron deficiency anemia   . AV block, Mobitz 2 03/25/2018  . Mobitz type 2 second degree AV block 03/20/2018  . Aortic stenosis 03/20/2018  . Exertional dyspnea 03/19/2018    Expected Discharge Date: Expected Discharge Date: 06/19/18  Team Members Present: Physician leading conference: Dr.  Delice Lesch Social Worker Present: Lennart Pall, LCSW Nurse Present: Rayetta Humphrey, RN PT Present: Barrie Folk, PT OT Present: Willeen Cass, OT SLP Present: Windell Moulding, SLP PPS Coordinator present : Daiva Nakayama, RN, CRRN     Current Status/Progress Goal Weekly Team Focus  Medical   Functional deficits secondary to L4-5 osteomyelitis/discitis, lumbar spondylosis   Improve mobilit,y pain, diskitis, ABLA, DM, CHF, HCAP  See above   Bowel/Bladder   Continent of B/B female urinal and bedpan  Remain continent of B/B mod assist with bedpan  assist with toileting q 2 hr and prn laxative prn   Swallow/Nutrition/ Hydration             ADL's   mod assist bathing, mod assist UB dressing, mod assist transfers with slide board.  Much improved participation in bed mobility, still limited due to body habitus and debility with self-care tasks  Min assist  ADL retraining, activity tolerance, toilet transfers, DME/AE education   Mobility   Mod-mox assist bed mobility. Min-mod assist SB trasnfers to and from bed to [facility]. max assist sit<>stand in parallel bars and min-supervision assist for WC propulsion   Min assist trasnfers to and from [facility]. supervision assist WC mobility. min assist bed mobility.   improved BLE and UE strengthening. improved safety and independence with bed mobility, and transfers. initiation of gait training and standing balance and strengthening.  Communication             Safety/Cognition/ Behavioral Observations            Pain   Pain in right hip and right knee scheduled meds prefer before therapy with breakfast. Oxy 5mg  q 6hr PRN, Ultram 50mg  q6 and tylenol prn  Pain will be<=3/10  medicate as directed and PRN   Skin   bruises to abdomen from Blood thinner, left lower back with film dressing,   skin free of breakdown or infection  assess skin q shift and prn      *See Care Plan and progress notes for long and short-term goals.     Barriers to Discharge  Current  Status/Progress Possible Resolutions Date Resolved   Physician    Medical stability;Weight;New diabetic;IV antibiotics     See above  Therapies, follow labs, optimize DM/HTN meds, follow weights, IV abx, hemoccult pending      Nursing                  PT                    OT                  SLP                SW                Discharge Planning/Teaching Needs:  Pt to d/c home with daughter-in-law, [name], to be primary caregiver.  [employer] to cover 24/7 assistance.  Teaching scheduled to begin today with daughter-in-law.   Team Discussion:  Newly diagnosed HCAP - being treated with abx.  Nope to be able to change to oral abx by d/c.  Pain continues to be a complaint/ issue.  Making some improvements but not consistent levels of assist.   Can be min - mod/max with transfers.  Standing with STEDY only.  Having intense spasms with right hip.  Requires a lot of assist overall.  SW to follow up with family to determine if they can meet her assist needs for d/c home.  Revisions to Treatment Plan:  None    Continued Need for Acute Rehabilitation Level of Care: The patient requires daily medical management by a physician with specialized training in physical medicine and rehabilitation for the following conditions: Daily direction of a multidisciplinary physical rehabilitation program to ensure safe treatment while eliciting the highest outcome that is of practical value to the patient.: Yes Daily medical management of patient stability for increased activity during participation in an intensive rehabilitation regime.: Yes Daily analysis of laboratory values and/or radiology reports with any subsequent need for medication adjustment of medical intervention for : Neurological problems;Diabetes problems;Blood pressure problems;Renal problems;Other;Mood/behavior problems  Lovey Crupi 06/12/2018, 11:01 AM

## 2018-06-11 NOTE — Progress Notes (Signed)
Physical Therapy Session Note  Patient Details  Name: Erika Fry MRN: [id number] Date of Birth: 01/19/1946  Today's Date: 06/11/2018 PT Individual Time: 0915-1000 AND 1300-1400 PT Individual Time Calculation (min): 45 min and 60 min   Short Term Goals: Week 3:  PT Short Term Goal 1 (Week 3): Pt will perform sit<>stand with max assist of 1 outside of Stedy PT Short Term Goal 2 (Week 3): Pt will initiate gait training  PT Short Term Goal 3 (Week 3): Pt will maintain standing tolerance x 2 min to prepare for ambulation PT Short Term Goal 4 (Week 3): Pt will perform bed mobility with mod assist  PT Short Term Goal 5 (Week 3): Pt will perform bed<>chair transfer with max assist of 1 consistently and LRAD  Week 4:     Skilled Therapeutic Interventions/Progress Updates:   Pt received supine in bed and agreeable to PT. Supine>sit transfer with mod  assist and HOB elevated. Lower body dressing with total assist for clothing management using  with lateral lean technique.   SB transfer to [place] with min assist from PT. PT required to place SB and provided min cues for improved UE placement, increased use of BLE and improved anterior weight shift.   WC mobility x 132f with min assist for doorway management and cues for improved control of WC in turns.   Sit<>stand in parallel bars with max assist x3. PT required block BLE to attain full standing position. Standing tolerance x 1.5 min with min assist from PT to provided tactile stimulation to glutes and quads for improved activation.   Gait training in parallel bars with max assist x 7 ftforward and x 5 ft backward. Blocking of the stance limb required throughout gait due to knee instability with 2 near falls.   [facility] to [facility] and all needs met.     Session 2.   Pt received supine in bed and agreeable to PT. Supine>sit transfer with moderate assist, HOB elevated and moderate cues for  improved sequencing and use of LE. [facility] to poor clearance of gluteal surface on board.    Pt transported to day room. Mod assist SB transfers to Nustep and min-mod assist for [facility] BUE/BLE strengthening and endurance traingin on Nustep 5 min x2, level 5>3. Prolonged rest break following each bout due to BLE fatigue. SpO2 >95% throughout.   WC mobility x 1329fwith supervision assist and min cues for navigation around obstacles in hall and doorway management.   [facility] returned to [facility] and [facility] reach and all needs met.          Therapy Documentation Precautions:  Precautions Precautions: Fall Precaution Comments: Pacemaker placement 4 weeks ago Required Braces or Orthoses: Spinal Brace Spinal Brace: Lumbar corset Restrictions Weight Bearing Restrictions: No    Vital Signs: Therapy Vitals Pulse Rate: 81 Resp: 20 BP: (!) 136/100 Patient Position (if appropriate): Lying Oxygen Therapy SpO2: 94 % O2 Device: Room Air Pain: Pain Assessment Pain Score: 5  Low back  See Function Navigator for Current Functional Status.   Therapy/Group: Individual Therapy  AuLorie Phenix/10/2018, 10:02 AM

## 2018-06-11 NOTE — Plan of Care (Signed)
  Problem: RH PAIN MANAGEMENT Goal: RH STG PAIN MANAGED AT OR BELOW PT'S PAIN GOAL Description Less than 4  Outcome: Progressing  Assess pain, administer pain regimen as ordered and needed.

## 2018-06-12 ENCOUNTER — Inpatient Hospital Stay (HOSPITAL_COMMUNITY): Payer: Medicare Other | Admitting: Occupational Therapy

## 2018-06-12 ENCOUNTER — Inpatient Hospital Stay (HOSPITAL_COMMUNITY): Payer: Medicare Other

## 2018-06-12 ENCOUNTER — Inpatient Hospital Stay (HOSPITAL_COMMUNITY): Payer: Medicare Other | Admitting: Physical Therapy

## 2018-06-12 LAB — CBC WITH DIFFERENTIAL/PLATELET
ABS IMMATURE GRANULOCYTES: 0.5 10*3/uL — AB (ref 0.0–0.1)
Basophils Absolute: 0 10*3/uL (ref 0.0–0.1)
Basophils Relative: 0 %
Eosinophils Absolute: 0 10*3/uL (ref 0.0–0.7)
Eosinophils Relative: 0 %
HEMATOCRIT: 25 % — AB (ref 36.0–46.0)
Hemoglobin: 8.1 g/dL — ABNORMAL LOW (ref 12.0–15.0)
Immature Granulocytes: 5 %
LYMPHS ABS: 1 10*3/uL (ref 0.7–4.0)
Lymphocytes Relative: 10 %
MCH: 29.6 pg (ref 26.0–34.0)
MCHC: 32.4 g/dL (ref 30.0–36.0)
MCV: 91.2 fL (ref 78.0–100.0)
MONO ABS: 0.6 10*3/uL (ref 0.1–1.0)
MONOS PCT: 6 %
NEUTROS ABS: 8.1 10*3/uL — AB (ref 1.7–7.7)
Neutrophils Relative %: 79 %
Platelets: 334 10*3/uL (ref 150–400)
RBC: 2.74 MIL/uL — ABNORMAL LOW (ref 3.87–5.11)
RDW: 15.1 % (ref 11.5–15.5)
WBC: 10.2 10*3/uL (ref 4.0–10.5)

## 2018-06-12 LAB — GLUCOSE, CAPILLARY
GLUCOSE-CAPILLARY: 293 mg/dL — AB (ref 70–99)
GLUCOSE-CAPILLARY: 300 mg/dL — AB (ref 70–99)
Glucose-Capillary: 272 mg/dL — ABNORMAL HIGH (ref 70–99)
Glucose-Capillary: 263 mg/dL — ABNORMAL HIGH (ref 70–99)

## 2018-06-12 LAB — BASIC METABOLIC PANEL
ANION GAP: 11 (ref 5–15)
BUN: 97 mg/dL — ABNORMAL HIGH (ref 8–23)
CHLORIDE: 96 mmol/L — AB (ref 98–111)
CO2: 25 mmol/L (ref 22–32)
CREATININE: 2.59 mg/dL — AB (ref 0.44–1.00)
Calcium: 9.5 mg/dL (ref 8.9–10.3)
GFR calc Af Amer: 20 mL/min — ABNORMAL LOW (ref >60)
GFR calc non Af Amer: 17 mL/min — ABNORMAL LOW (ref >60)
GLUCOSE: 293 mg/dL — AB (ref 70–99)
Potassium: 5 mmol/L (ref 3.5–5.1)
Sodium: 132 mmol/L — ABNORMAL LOW (ref 135–145)

## 2018-06-12 MED ORDER — LINEZOLID 600 MG PO TABS
600.0000 mg | ORAL_TABLET | Freq: Two times a day (BID) | ORAL | Status: DC
  Administered 2018-06-14 – 2018-06-22 (×17): 600 mg via ORAL
  Filled 2018-06-12 (×18): qty 1

## 2018-06-12 MED ORDER — TIZANIDINE HCL 2 MG PO TABS
2.0000 mg | ORAL_TABLET | Freq: Every day | ORAL | Status: DC
  Administered 2018-06-12 – 2018-06-22 (×11): 2 mg via ORAL
  Filled 2018-06-12 (×11): qty 1

## 2018-06-12 MED ORDER — DARBEPOETIN ALFA 60 MCG/0.3ML IJ SOSY
60.0000 ug | PREFILLED_SYRINGE | INTRAMUSCULAR | Status: DC
  Filled 2018-06-12: qty 0.3

## 2018-06-12 MED ORDER — SODIUM CHLORIDE 0.9 % IV SOLN
300.0000 mg | Freq: Two times a day (BID) | INTRAVENOUS | Status: AC
  Administered 2018-06-12 – 2018-06-13 (×3): 300 mg via INTRAVENOUS
  Filled 2018-06-12 (×6): qty 300

## 2018-06-12 MED ORDER — DARBEPOETIN ALFA 60 MCG/0.3ML IJ SOSY
60.0000 ug | PREFILLED_SYRINGE | INTRAMUSCULAR | Status: DC
  Administered 2018-06-12: 60 ug via SUBCUTANEOUS
  Filled 2018-06-12: qty 0.3

## 2018-06-12 MED ORDER — SODIUM CHLORIDE 0.9 % IV SOLN
2.0000 g | INTRAVENOUS | Status: DC
  Administered 2018-06-14 – 2018-06-22 (×9): 2 g via INTRAVENOUS
  Filled 2018-06-12 (×9): qty 20

## 2018-06-12 NOTE — Progress Notes (Signed)
Occupational Therapy Session Note  Patient Details  Name: Erika Fry MRN: [id number] Date of Birth: Nov 04, 1946  Today's Date: 06/12/2018 OT Individual Time: 1305-1405 OT Individual Time Calculation (min): 60 min    Short Term Goals: Week 3:  OT Short Term Goal 1 (Week 3): STGs = LTGs (LTGs downgraded 06/10/18)  Skilled Therapeutic Interventions/Progress Updates:    Treatment session with focus on sit > stand and toilet transfers to decrease burden of care with ADLs.  Pt received supine in bed, reporting completing stand pivot transfers during PT session this AM.  Completed bed mobility to sitting EOB with supervision with HOB elevated and use of bed rail.  Sit > stand with mod-max assist from EOB with 2nd person stabilizing RW.  Stand pivot to [facility] with increased time and pt verbally processing each step with mod assist +2 for safety.  Discussed lateral leans vs standing for clothing management with hygiene, pt reporting difficulty with donning shorts this AM with lateral leans.  Completed sit > stand x2 with max assist due to difficulty bringing trunk fully upright in standing.  Educated on hand placement to increase safety and independence with sit > stand.  Improved initiation of movement with improved hand placement, however continues to require mod assist to come fully upright.  Engaged in blocked practice of sit > stand, mostly coming to partial standing.  Completed transfer BSC to w/c with use of Stedy (Mod assist of 1) as recommending pt transfer to toilet for all toileting needs.  Pt left upright in w/c with all needs in reach.  Therapy Documentation Precautions:  Precautions Precautions: Fall Precaution Comments: Pacemaker placement 4 weeks ago Required Braces or Orthoses: Spinal Brace Spinal Brace: Lumbar corset Restrictions Weight Bearing Restrictions: (P) No General:   Vital Signs: Therapy Vitals Temp: 97.6 F (36.4 C) Temp Source: Oral Pulse Rate: 87 Resp: 14 BP:  (!) 151/97 Patient Position (if appropriate): Sitting Oxygen Therapy SpO2: 98 % O2 Device: Room Air Pain: Pain Assessment Pain Scale: 0-10 Pain Score: 7  Pain Type: Chronic pain Pain Location: Hip Pain Orientation: Right Pain Descriptors / Indicators: Aching Pain Frequency: Constant Pain Onset: On-going Patients Stated Pain Goal: 2 Pain Intervention(s): Medication (See eMAR)  See Function Navigator for Current Functional Status.   Therapy/Group: Individual Therapy  Simonne Come 06/12/2018, 3:01 PM

## 2018-06-12 NOTE — Progress Notes (Addendum)
[facility] for Infectious Disease  Date of Admission:  05/22/2018   Total days of antibiotics 30        Day 6: Ceftaroline           ASSESSMENT: Erika Fry is a 72 y.o female who presented with culture negative lumbar discitis and developed SOB pneumonia on xray about ten days after admission. She is feeling much better and making progress in therapy with no supplemental oxygen. Her repeat chest xray has shown improvement in her pneumonia and she states her back pain feels better as well. We plan to continue ceftaroline treatment until Saturday and then switch Erika Fry to oral linezolid and ceftriaxone therapy for culture negative lumbar discitis until 06/24/18. We are signing off on Erika Fry but will be available for any questions regarding her care.   PLAN: 1. Continue ceftaroline treatment until Saturday 06/13/18 2. Switch to oral linezolid and ceftriaxone treatment on 06/14/18 until 06/24/18    Principal Problem:   Lumbar discitis Active Problems:   CKD (chronic kidney disease), stage IV (HCC)   HCAP (healthcare-associated pneumonia)   Diet-controlled diabetes mellitus (H[facility])   Iron deficiency anemia   Essential hypertension   Gastric polyp   Acute right-sided low back pain with right-sided sciatica   Radicular pain   Acute blood loss anemia   Anemia of chronic disease   Thrombocytopenia ([place])   New onset type 2 diabetes mellitus ([place])   Morbid obesity (HCC)   Hyponatremia   Benign essential HTN   Hypoalbuminemia due to protein-calorie malnutrition (H[facility])   Edema of hand   Hyperkalemia   SOB (shortness of breath)   Somnolence   Delirium   Sleep disturbance   Hypertensive crisis   Hypokalemia   Generalized edema   Steroid-induced hyperglycemia   Scheduled Meds: . acetaminophen  650 mg Oral TID AC & HS  . allopurinol  200 mg Oral Daily  . amLODipine  10 mg Oral Daily  . Chlorhexidine Gluconate Cloth  6 each Topical Q0600  . ferrous sulfate   325 mg Oral BID WC  . folic acid  1 mg Oral Daily  . gabapentin  100 mg Oral TID  . hydrALAZINE  75 mg Oral TID  . insulin aspart  0-5 Units Subcutaneous QHS  . insulin aspart  0-9 Units Subcutaneous TID WC  . insulin glargine  20 Units Subcutaneous Daily  . levothyroxine  75 mcg Oral QAC breakfast  . lidocaine  1 patch Transdermal Q24H  . loratadine  10 mg Oral Daily  . Melatonin  1.5 mg Oral QHS  . multivitamin with minerals   Oral Daily  . oxyCODONE  5 mg Oral BID WC  . pantoprazole  40 mg Oral Q0600  . predniSONE  40 mg Oral Q breakfast   Followed by  . [START ON 06/14/2018] predniSONE  30 mg Oral Q breakfast   Followed by  . [START ON 06/16/2018] predniSONE  20 mg Oral Q breakfast   Followed by  . [START ON 06/18/2018] predniSONE  10 mg Oral Q breakfast   Followed by  . [START ON 06/20/2018] predniSONE  5 mg Oral Q breakfast  . protein supplement shake  11 oz Oral TID  . senna  1 tablet Oral Daily  . simvastatin  40 mg Oral QHS  . tiZANidine  2 mg Oral Daily  . vitamin B-12  1,000 mcg Oral Daily   Continuous Infusions: . sodium chloride Stopped (  06/10/18 1036)  . sodium chloride 10 mL/hr at 06/12/18 0840  . ceFTAROline (TEFLARO) IV Stopped (06/12/18 0129)   PRN Meds:.acetaminophen, acetaminophen, alum & mag hydroxide-simeth, bisacodyl, diphenhydrAMINE, fluticasone, guaiFENesin-dextromethorphan, naLOXone (NARCAN)  injection, oxyCODONE, polyethylene glycol, prochlorperazine **OR** prochlorperazine **OR** prochlorperazine, sodium chloride flush, sodium phosphate, traMADol, traZODone   SUBJECTIVE: Erika Fry was seen and evaluated at the bedside today. She reported she feels much better and feels as if she is making great progress in therapy. She states her back pain is improving and she is no longer requiring supplemental oxygen, experiencing a cough or SOB. She is determined to continue therapy and get back to ambulating.   Review of Systems: Review of Systems    Constitutional: Negative for chills and fever.  Respiratory: Negative for cough, sputum production and shortness of breath.   Cardiovascular: Negative for chest pain and orthopnea.  Musculoskeletal: Positive for back pain.    Allergies  Allergen Reactions  . Codeine Other (See Comments)    Increases Pain and couldn't sleep    OBJECTIVE: Vitals:   06/11/18 1520 06/11/18 2012 06/12/18 0519 06/12/18 0627  BP:  (!) 150/72 129/73   Pulse:  89 83   Resp:  17 17   Temp:  98.8 F (37.1 C) 98.5 F (36.9 C)   TempSrc:  Oral Oral   SpO2:  96% 97%   Weight: 99.2 kg (218 lb 11.1 oz)   96.8 kg (213 lb 6.5 oz)  Height:       Body mass index is 35.51 kg/m.  Physical Exam  Constitutional: She is oriented to person, place, and time.  Cardiovascular: Normal rate and regular rhythm.  Murmur heard. Pulmonary/Chest: Effort normal and breath sounds normal. No respiratory distress.  Neurological: She is alert and oriented to person, place, and time.    Lab Results Lab Results  Component Value Date   WBC 10.2 06/12/2018   HGB 8.1 (L) 06/12/2018   HCT 25.0 (L) 06/12/2018   MCV 91.2 06/12/2018   PLT 334 06/12/2018    Lab Results  Component Value Date   CREATININE 2.59 (H) 06/12/2018   BUN 97 (H) 06/12/2018   NA 132 (L) 06/12/2018   K 5.0 06/12/2018   CL 96 (L) 06/12/2018   CO2 25 06/12/2018    Lab Results  Component Value Date   ALT 35 05/29/2018   AST 77 (H) 05/29/2018   ALKPHOS 102 05/29/2018   BILITOT 0.6 05/29/2018     Microbiology: No results found for this or any previous visit (from the past 240 hour(s)).  [facility]  06/12/2018, 11:18 AM

## 2018-06-12 NOTE — Progress Notes (Signed)
Occupational Therapy Session Note  Patient Details  Name: Erika Fry MRN: [id number] Date of Birth: 07-31-1946  Today's Date: 06/12/2018 OT Group Time: 1500-1600 OT Group Time Calculation (min): 60 min   Skilled Therapeutic Interventions/Progress Updates:    Pt participates in skilled therapeutic tx group of making a pinata focusing on divided attention, BUE use, social participation, functional reach, sitting balance/postural control and fine motor coordination. Pt able to follow recipe to mix/measure ingredients to make paper mache paste without cuing. Pt uses BUE to fold and cut strips of tissue paper with scissors following pattern, dip in paper mache, and place paper on pinata base. Encouraged pt to switch UE use part way through session for even use. Pt very conversational throughout with other group members laughing and sharing stories. Exited session with pt escorted back to room and set up with call bell in reach and exit alarm on.  Therapy Documentation Precautions:  Precautions Precautions: Fall Precaution Comments: Pacemaker placement 4 weeks ago Required Braces or Orthoses: Spinal Brace Spinal Brace: Lumbar corset Restrictions Weight Bearing Restrictions: (P) No General:   Vital Signs: Therapy Vitals Temp: 97.6 F (36.4 C) Temp Source: Oral Pulse Rate: 87 Resp: 14 BP: (!) 151/97 Patient Position (if appropriate): Sitting Oxygen Therapy SpO2: 98 % O2 Device: Room Air  See Function Navigator for Current Functional Status.   Therapy/Group: Individual Therapy  Tonny Branch 06/12/2018, 4:28 PM

## 2018-06-12 NOTE — Progress Notes (Signed)
MEDICATION RELATED CONSULT NOTE - INITIAL   Pharmacy Consult for Darbepoetin Indication: anemia d/t CKD  Allergies  Allergen Reactions  . Codeine Other (See Comments)    Increases Pain and couldn't sleep    Patient Measurements: Height: 5\' 5"  (165.1 cm) Weight: 213 lb 6.5 oz (96.8 kg) IBW/kg (Calculated) : 57  Vital Signs: Temp: 97.6 F (36.4 C) (07/12 1414) Temp Source: Oral (07/12 1414) BP: 151/97 (07/12 1414) Pulse Rate: 87 (07/12 1414) Intake/Output from previous day: 07/11 0701 - 07/12 0700 In: 720 [P.O.:720] Out: -  Intake/Output from this shift: Total I/O In: 1230 [P.O.:480; IV Piggyback:750] Out: -   Labs: Recent Labs    06/11/18 0351 06/12/18 0442  WBC  --  10.2  HGB  --  8.1*  HCT  --  25.0*  PLT  --  334  CREATININE 2.98* 2.59*   Estimated Creatinine Clearance: 21.9 mL/min (A) (by C-G formula based on SCr of 2.59 mg/dL (H)).   Microbiology: Recent Results (from the past 720 hour(s))  Fungus Culture With Stain     Status: None (Preliminary result)   Collection Time: 05/15/18  3:09 PM  Result Value Ref Range Status   Fungus Stain Final report  Final    Comment: (NOTE) Performed At: [facility] [place], [place] [id number] Rush Farmer MD OT:[id number]    Fungus (Mycology) Culture PENDING  Incomplete   Fungal Source ABSCESS  Final    Comment: LUMBAR ASPIRATE Performed at [facility], [place] [address] [place], [place] [address]   Aerobic/Anaerobic Culture (surgical/deep wound)     Status: None   Collection Time: 05/15/18  3:09 PM  Result Value Ref Range Status   Specimen Description ABSCESS  Final   Special Requests LUMBAR ASPIRATE  Final   Gram Stain   Final    FEW WBC PRESENT,BOTH PMN AND MONONUCLEAR NO ORGANISMS SEEN    Culture   Final    No growth aerobically or anaerobically. Performed at [facility], [place] [address] [place], [place] [address]    Report Status 05/20/2018 FINAL  Final  Fungus  Culture Result     Status: None   Collection Time: 05/15/18  3:09 PM  Result Value Ref Range Status   Result 1 Comment  Final    Comment: (NOTE) KOH/Calcofluor preparation:  no fungus observed. Performed At: [facility] [place], [place] [id number] Rush Farmer MD YQ:[id number] Performed at [facility], [place] [address] [place], [place] [address]   Culture, blood (routine x 2)     Status: None   Collection Time: 05/16/18  9:41 PM  Result Value Ref Range Status   Specimen Description BLOOD LEFT ANTECUBITAL  Final   Special Requests   Final    BOTTLES DRAWN AEROBIC AND ANAEROBIC Blood Culture adequate volume   Culture   Final    NO GROWTH 5 DAYS Performed at [facility], [place] [address] [place], [place] [address]    Report Status 05/21/2018 FINAL  Final  Culture, blood (routine x 2)     Status: None   Collection Time: 05/16/18  9:41 PM  Result Value Ref Range Status   Specimen Description BLOOD LEFT ANTECUBITAL  Final   Special Requests   Final    BOTTLES DRAWN AEROBIC AND ANAEROBIC Blood Culture adequate volume   Culture   Final    NO GROWTH 5 DAYS Performed at [facility], [place]  [address] [place], [place] [address]    Report Status 05/21/2018 FINAL  Final  Urine Culture     Status: None   Collection Time: 05/29/18  3:20 AM  Result Value Ref Range Status   Specimen Description URINE, RANDOM  Final   Special Requests NONE  Final   Culture   Final    NO GROWTH Performed at [facility], [place] [address], [place], [place] [address]    Report Status 05/31/2018 FINAL  Final  Culture, blood (routine x 2)     Status: None   Collection Time: 05/30/18  6:43 PM  Result Value Ref Range Status   Specimen Description BLOOD RIGHT ARM  Final   Special Requests   Final    BOTTLES DRAWN AEROBIC AND ANAEROBIC Blood Culture results may not be optimal due to an inadequate volume of blood received in culture bottles   Culture   Final    NO  GROWTH 5 DAYS Performed at [facility], [place] [address] [place], [place] [address]    Report Status 06/04/2018 FINAL  Final  Culture, blood (routine x 2)     Status: None   Collection Time: 05/30/18  6:53 PM  Result Value Ref Range Status   Specimen Description BLOOD RIGHT WRIST  Final   Special Requests   Final    BOTTLES DRAWN AEROBIC AND ANAEROBIC Blood Culture results may not be optimal due to an inadequate volume of blood received in culture bottles   Culture   Final    NO GROWTH 5 DAYS Performed at [facility], [place] [address], [place], [place] [address]    Report Status 06/04/2018 FINAL  Final    Medical History: Past Medical History:  Diagnosis Date  . Arthritis    "knees, thumbs" (03/25/2018)  . CKD (chronic kidney disease), stage IV ([place])   . Diet-controlled diabetes mellitus ([place])   . GERD (gastroesophageal reflux disease)   . Gout    "on daily RX" (03/25/2018)  . Heart murmur   . High cholesterol   . Hypertension   . Hypothyroidism   . Iron deficiency anemia    "had to get an iron infusion"  . Migraine    "used to have them growing up" (03/25/2018)  . Presence of permanent cardiac pacemaker 03/25/2018    Assessment: 59 YOF recently being treated with osteomyelitis and spinal abscess. Pt with CKD, pharmacy is consulted to dose aranesp for anemia d/t kidney disease. Hgb has been ~ 8. Most recent iron level (05/13/2018) is adequate with iron = 91, ferritin 278, saturation 30%, Pt is currently on PO iron ferrous sulfate 325mg  TID. Reviewed recent medication history with [facility]. Pt received monthly epogen 10,000 units injection in 2018, no recent record available  Goal of Therapy:  hgb > 10  Plan:  Aranesp 60 mcg Q 4weeks  Pharmacy sign off Will monitor CBC and hgb response peripherally    Thanks!  Maryanna Shape, PharmD, BCPS, BCPPS Clinical Pharmacist  Pager: [phone]   06/12/2018,3:39 PM

## 2018-06-12 NOTE — Progress Notes (Signed)
Physical Therapy Weekly Progress Note  Patient Details  Name: Erika Fry MRN: [id number] Date of Birth: [birth year]  Beginning of progress report period: June 05, 2018 End of progress report period: June 12, 2018  Today's Date: 06/12/2018 PT Individual Time: 0915-1015 PT Individual Time Calculation (min): 60 min   Patient has met 4 and 5 of 5 short term goals.  Pt has made significant progress over the last week. Currently able to complete bed mobility for sit<>supine and rolling with Moderate assist. SB transfer to and from [facility] with min assist, gait training initiated over the past week with mod-max assist in parallel bars and Pt able to propel WC up to 180f with min-supervision assist.   Patient continues to demonstrate the following deficits muscle weakness and muscle joint tightness, decreased cardiorespiratoy endurance, unbalanced muscle activation and decreased sitting balance, decreased standing balance, decreased postural control and decreased balance strategies and therefore will continue to benefit from skilled PT intervention to increase functional independence with mobility.  Patient progressing toward long term goals..  Continue plan of care.  PT Short Term Goals Week 2:  PT Short Term Goal 1 (Week 2): Pt will tolerate 1 hour of sitting up in chair w/ minimal increase in pain or fatigue PT Short Term Goal 1 - Progress (Week 2): Met PT Short Term Goal 2 (Week 2): Pt will transfer bed<>chair w/ max assist x1 PT Short Term Goal 2 - Progress (Week 2): Met PT Short Term Goal 3 (Week 2): Pt will perform sit<>stand w/ LRAD, max assist x1 PT Short Term Goal 3 - Progress (Week 2): Met Week 3:  PT Short Term Goal 1 (Week 3): Pt will perform sit<>stand with max assist of 1 outside of [place] PT Short Term Goal 1 - Progress (Week 3): Met PT Short Term Goal 2 (Week 3): Pt will initiate gait training  PT Short Term Goal 2 - Progress (Week 3): Met PT Short Term Goal 3 (Week 3): Pt will  maintain standing tolerance x 2 min to prepare for ambulation PT Short Term Goal 3 - Progress (Week 3): Met PT Short Term Goal 4 (Week 3): Pt will perform bed mobility with mod assist  PT Short Term Goal 4 - Progress (Week 3): Met PT Short Term Goal 5 (Week 3): Pt will perform bed<>chair transfer with max assist of 1 consistently and LRAD  PT Short Term Goal 5 - Progress (Week 3): Met Week 4:  PT Short Term Goal 1 (Week 4): STG =LTG due to ELOS    Skilled Therapeutic Interventions/Progress Updates:   Pt received supine in bed and agreeable to PT. Supine>sit transfer with moderate assist and moderate cues for use of bed rail. Educated pt on need for hospital bed once returned to home environment for safety.    Dressing with max assist from to manage clothing using lateral lean technique. Pt unable to perform clothing management due to pain in the R hip with R lateral leans.   Sit<>stand from 24 inch bed height with moderate assist from PT and moderate multimodal cues for set up, UE placement, and use of AD. [facility] stability in stance and AD management.   WC mobility to rehab gym x [phone] supervision assist from PT. Min cues for improved use of of BUE in turns to reduce turning radius.   [place] [facility] with max assist from PT to facilitate trunk and hip extension once  in standing. Pt returned to room in [facility]  Stand pivot to bed with RW and mod-max assist to attain full standing position and blocking of the stance limbs to prevent knee collapse. Returned to bed with Mod assist to control BLE for sit>supine. Pt left supine in bed with all needs met.      Therapy Documentation Precautions:  Precautions Precautions: Fall Precaution Comments: Pacemaker placement 4 weeks ago Required Braces or Orthoses: Spinal Brace Spinal Brace: Lumbar corset Restrictions Weight Bearing Restrictions: (P) No    Pain: Pain Assessment Pain Score: 4     See Function Navigator for Current Functional Status.  Therapy/Group: Individual Therapy  [name] 06/12/2018, 10:34 AM

## 2018-06-12 NOTE — Progress Notes (Signed)
Social Work Patient ID: Erika Fry, female   DOB: 10/13/1946, 72 y.o.   MRN: [id number]  Met with pt yesterday to review team conference.  Patient aware that team continues to aim toward discharge date of 7/19 with min/modified assistance level goals from a wheelchair.  Patient reports that she feels she has made good progress the past few days and is very determined to "go home from here."  Patient expresses much confidence that she will reach a level that her family will be able to manage at home as caregivers.  Spoke with both Museum/gallery conservator and Anderson Malta (daughters- in- law) today about patient's care needs for home.  Amber confirms that she continues to plan to be the primary caregiver for patient and is feeling more confident about providing care given patient's progress the past few days.  Amber also states that she believes she will need to "make her do it" at home.  States, "she is not going to sit around and watch TV."  Have planned for Amber to be here at 1:00 next Tuesday to complete formal family education.  Have planned to meet with Amber and other members at 3:00 that same day to address any concerns and questions and review DME and follow up arrangements.    Amry Cathy, LCSW

## 2018-06-12 NOTE — Progress Notes (Signed)
Subjective/Complaints: Patient seen sitting up in bed this morning. She states she slept well overnight. She states she had the best daythat she has had no therapies in terms of pain yesterday. She states that she is doing very well with therapies and exceeding all therapists expectations.  ROS: Denies CP, S OB, nausea, vomiting diarrhea.  Objective: Vital Signs: Blood pressure 129/73, pulse 83, temperature 98.5 F (36.9 C), temperature source Oral, resp. rate 17, height 5' 5" (1.651 m), weight 96.8 kg (213 lb 6.5 oz), SpO2 97 %. Dg Chest 1 View  Result Date: 06/10/2018 CLINICAL DATA:  Episodic shortness of breath today. Possible pneumonia. EXAM: CHEST  1 VIEW COMPARISON:  06/07/2018 and 05/28/2018 radiographs. FINDINGS: 1407 hour. Right IJ central venous catheter extends to the lower right atrial level, stable. Left subclavian pacemaker leads appear unchanged. There is stable mild cardiomegaly and aortic atherosclerosis. Left-greater-than-right basilar opacities and a probable small left pleural effusion have not significantly changed. Right apical opacity has slightly improved. There is no pneumothorax. The bones appear unchanged. IMPRESSION: No significant change in left-greater-than-right basilar airspace opacities and small left pleural effusion. Right apical opacity has slightly improved over the last 3 days. Electronically Signed   By: Richardean Sale M.D.   On: 06/10/2018 14:18   Results for orders placed or performed during the hospital encounter of 05/22/18 (from the past 72 hour(s))  Glucose, capillary     Status: Abnormal   Collection Time: 06/09/18 12:05 PM  Result Value Ref Range   Glucose-Capillary 265 (H) 70 - 99 mg/dL  Glucose, capillary     Status: Abnormal   Collection Time: 06/09/18  5:30 PM  Result Value Ref Range   Glucose-Capillary 316 (H) 70 - 99 mg/dL  Glucose, capillary     Status: Abnormal   Collection Time: 06/09/18  9:09 PM  Result Value Ref Range    Glucose-Capillary 303 (H) 70 - 99 mg/dL   Comment 1 Notify RN   Glucose, capillary     Status: Abnormal   Collection Time: 06/10/18  6:33 AM  Result Value Ref Range   Glucose-Capillary 304 (H) 70 - 99 mg/dL  Glucose, capillary     Status: Abnormal   Collection Time: 06/10/18 12:01 PM  Result Value Ref Range   Glucose-Capillary 289 (H) 70 - 99 mg/dL  Glucose, capillary     Status: Abnormal   Collection Time: 06/10/18  4:59 PM  Result Value Ref Range   Glucose-Capillary 303 (H) 70 - 99 mg/dL  Glucose, capillary     Status: Abnormal   Collection Time: 06/10/18  9:18 PM  Result Value Ref Range   Glucose-Capillary 309 (H) 70 - 99 mg/dL  Basic metabolic panel     Status: Abnormal   Collection Time: 06/11/18  3:51 AM  Result Value Ref Range   Sodium 132 (L) 135 - 145 mmol/L   Potassium 5.7 (H) 3.5 - 5.1 mmol/L   Chloride 94 (L) 98 - 111 mmol/L    Comment: Please note change in reference range.   CO2 26 22 - 32 mmol/L   Glucose, Bld 311 (H) 70 - 99 mg/dL    Comment: Please note change in reference range.   BUN 95 (H) 8 - 23 mg/dL    Comment: Please note change in reference range.   Creatinine, Ser 2.98 (H) 0.44 - 1.00 mg/dL   Calcium 9.4 8.9 - 10.3 mg/dL   GFR calc non Af Amer 14 (L) >60 mL/min   GFR calc Af  Amer 17 (L) >60 mL/min    Comment: (NOTE) The eGFR has been calculated using the CKD EPI equation. This calculation has not been validated in all clinical situations. eGFR's persistently <60 mL/min signify possible Chronic Kidney Disease.    Anion gap 12 5 - 15    Comment: Performed at [facility] [address], [place], [place] [address]  Glucose, capillary     Status: Abnormal   Collection Time: 06/11/18  6:57 AM  Result Value Ref Range   Glucose-Capillary 274 (H) 70 - 99 mg/dL  Glucose, capillary     Status: Abnormal   Collection Time: 06/11/18 11:24 AM  Result Value Ref Range   Glucose-Capillary 252 (H) 70 - 99 mg/dL  Glucose, capillary     Status: Abnormal    Collection Time: 06/11/18  4:40 PM  Result Value Ref Range   Glucose-Capillary 333 (H) 70 - 99 mg/dL  Occult blood card to lab, stool RN will collect     Status: None   Collection Time: 06/11/18  6:31 PM  Result Value Ref Range   Fecal Occult Bld NEGATIVE NEGATIVE    Comment: Performed at [facility], [place] [address], [place], [place] [address]  Glucose, capillary     Status: Abnormal   Collection Time: 06/11/18  8:12 PM  Result Value Ref Range   Glucose-Capillary 374 (H) 70 - 99 mg/dL  Glucose, capillary     Status: Abnormal   Collection Time: 06/11/18  9:52 PM  Result Value Ref Range   Glucose-Capillary 367 (H) 70 - 99 mg/dL  Basic metabolic panel     Status: Abnormal   Collection Time: 06/12/18  4:42 AM  Result Value Ref Range   Sodium 132 (L) 135 - 145 mmol/L   Potassium 5.0 3.5 - 5.1 mmol/L   Chloride 96 (L) 98 - 111 mmol/L    Comment: Please note change in reference range.   CO2 25 22 - 32 mmol/L   Glucose, Bld 293 (H) 70 - 99 mg/dL    Comment: Please note change in reference range.   BUN 97 (H) 8 - 23 mg/dL    Comment: Please note change in reference range.   Creatinine, Ser 2.59 (H) 0.44 - 1.00 mg/dL   Calcium 9.5 8.9 - 10.3 mg/dL   GFR calc non Af Amer 17 (L) >60 mL/min   GFR calc Af Amer 20 (L) >60 mL/min    Comment: (NOTE) The eGFR has been calculated using the CKD EPI equation. This calculation has not been validated in all clinical situations. eGFR's persistently <60 mL/min signify possible Chronic Kidney Disease.    Anion gap 11 5 - 15    Comment: Performed at [facility] [address], [place], [place] [address]  CBC with Differential/Platelet     Status: Abnormal   Collection Time: 06/12/18  4:42 AM  Result Value Ref Range   WBC 10.2 4.0 - 10.5 K/uL   RBC 2.74 (L) 3.87 - 5.11 MIL/uL   Hemoglobin 8.1 (L) 12.0 - 15.0 g/dL   HCT 25.0 (L) 36.0 - 46.0 %   MCV 91.2 78.0 - 100.0 fL   MCH 29.6 26.0 - 34.0 pg   MCHC 32.4 30.0 - 36.0 g/dL    RDW 15.1 11.5 - 15.5 %   Platelets 334 150 - 400 K/uL   Neutrophils Relative % 79 %   Neutro Abs 8.1 (H) 1.7 - 7.7 K/uL   Lymphocytes Relative 10 %  Lymphs Abs 1.0 0.7 - 4.0 K/uL   Monocytes Relative 6 %   Monocytes Absolute 0.6 0.1 - 1.0 K/uL   Eosinophils Relative 0 %   Eosinophils Absolute 0.0 0.0 - 0.7 K/uL   Basophils Relative 0 %   Basophils Absolute 0.0 0.0 - 0.1 K/uL   Immature Granulocytes 5 %   Abs Immature Granulocytes 0.5 (H) 0.0 - 0.1 K/uL    Comment: Performed at [facility] [address] [place], [place] [address]  Glucose, capillary     Status: Abnormal   Collection Time: 06/12/18  6:35 AM  Result Value Ref Range   Glucose-Capillary 272 (H) 70 - 99 mg/dL     Constitutional: No distress . Vital signs reviewed. HENT: Normocephalic.  Atraumatic. Eyes: EOMI. No discharge. Cardiovascular: RRR. No JVD. Respiratory: CTA bilaterally. Normal effort. GI: BS +. Non-distended. Musc: Generalized edema, improving Neuro: Alert Motor:  Bilateral upper extremities: 4+/5 proximal distal (stable) Bilateral lower extremities: Hip flexion 3+/5, knee extension 3+/5, ankle dorsiflexion 5/5  Skin: Warm and dry. Intact. Psych: Normal mood.  Normal behavior.  Assessment/Plan: 1. Functional deficits secondary to L4-5 Lumbar discitis which require 3+ hours per day of interdisciplinary therapy in a comprehensive inpatient rehab setting. Physiatrist is providing close team supervision and 24 hour management of active medical problems listed below. Physiatrist and rehab team continue to assess barriers to discharge/monitor patient progress toward functional and medical goals. FIM: Function - Bathing Bathing activity did not occur: Refused Position: Bed Body parts bathed by patient: Right arm, Left arm, Chest, Abdomen Body parts bathed by helper: Right lower leg, Left lower leg, Right upper leg, Left upper leg, Back, Front perineal area, Buttocks Assist Level: 2  helpers  Function- Upper Body Dressing/Undressing Upper body dressing/undressing activity did not occur: Refused What is the patient wearing?: Pull over shirt/dress Pull over shirt/dress - Perfomed by patient: Thread/unthread right sleeve, Thread/unthread left sleeve, Pull shirt over trunk Pull over shirt/dress - Perfomed by helper: Put head through opening Orthosis activity level: Performed by helper Assist Level: Touching or steadying assistance(Pt > 75%) Function - Lower Body Dressing/Undressing Lower body dressing/undressing activity did not occur: Refused What is the patient wearing?: Pants, Ted Hose, Non-skid slipper socks Position: Bed Pants- Performed by patient: Thread/unthread right pants leg, Thread/unthread left pants leg(using reacher) Pants- Performed by helper: Pull pants up/down Non-skid slipper socks- Performed by helper: Don/doff right sock, Don/doff left sock Shoes - Performed by helper: Don/doff right shoe, Don/doff left shoe TED Hose - Performed by helper: Don/doff right TED hose, Don/doff left TED hose Assist for lower body dressing: 2 Helpers  Function - Toileting Toileting activity did not occur: Safety/medical concerns Toileting steps completed by helper: Performs perineal hygiene Assist level: Two helpers  Function - Air cabin crew transfer activity did not occur: Safety/medical concerns Toilet transfer assistive device: Bedside commode, Mechanical lift Mechanical lift: Stedy Assist level to toilet: Maximal assist (Pt 25 - 49%/lift and lower) Assist level from toilet: Maximal assist (Pt 25 - 49%/lift and lower) Assist level to bedside commode (at bedside): 2 helpers Assist level from bedside commode (at bedside): 2 helpers  Function - Chair/bed transfer Chair/bed transfer activity did not occur: Refused Chair/bed transfer method: Lateral scoot Chair/bed transfer assist level: Touching or steadying assistance (Pt > 75%) Chair/bed transfer  assistive device: Sliding board, Armrests Mechanical lift: Stedy Chair/bed transfer details: Verbal cues for precautions/safety, Verbal cues for technique, Verbal cues for sequencing, Tactile cues for weight shifting, Manual facilitation for  placement  Function - Locomotion: Wheelchair Type: Manual Wheelchair activity did not occur: Refused Max wheelchair distance: 150 Assist Level: Supervision or verbal cues Wheel 50 feet with 2 turns activity did not occur: Refused Assist Level: Supervision or verbal cues Wheel 150 feet activity did not occur: Refused Assist Level: Supervision or verbal cues Function - Locomotion: Ambulation Ambulation activity did not occur: Safety/medical concerns Assistive device: Parallel bars Max distance: 41f Assist level: Moderate assist (Pt 50 - 74%) Walk 10 feet activity did not occur: Safety/medical concerns Walk 50 feet with 2 turns activity did not occur: Safety/medical concerns Walk 150 feet activity did not occur: Safety/medical concerns Walk 10 feet on uneven surfaces activity did not occur: Safety/medical concerns  Function - Comprehension Comprehension: Auditory Comprehension assist level: Follows complex conversation/direction with extra time/assistive device  Function - Expression Expression: Verbal Expression assist level: Expresses complex ideas: With extra time/assistive device  Function - Social Interaction Social Interaction assist level: Interacts appropriately with others with medication or extra time (anti-anxiety, antidepressant).  Function - Problem Solving Problem solving assist level: Solves complex 90% of the time/cues < 10% of the time  Function - Memory Memory assist level: Recognizes or recalls 90% of the time/requires cueing < 10% of the time Patient normally able to recall (first 3 days only): Current season, Location of own room, Staff names and faces  Medical Problem List and Plan:  1. Functional deficits secondary to  L4-5 osteomyelitis/discitis, lumbar spondylosis   Continue CIR  Xray ordered, reviewed stable 2. DVT Prophylaxis/Anticoagulation: Mechanical: Sequential compression devices, below knee Bilateral lower extremities   Doppler negative for DVT in LE  LUE doppler ordered to evaluate for DVT, negative 3. Pain Management: Scheduled oxycodone prior to therapy to help with activity tolerance, decreased to 5 mg on 6/27.   Added ultram prn for moderate pain. Monitor and titrate medications to help with neuropathic symptoms. Decadron d/ced.   RLE radicular pain, partially improved on gabapentin to [id number], decreased to 300 mg twice a day, DC'd on 6/27 due to lethargy and tremors. Gabapentin 100 3 times a day started on/3   Low dose dexamethasone DC'd on 6/26  Baclofen changed to 5 mg daily at bedtime, patient unable to tolerate higher doses, DC'd on 6/27 due to lethargy  Lidoderm patch added on 7/2  Tizanidine 2 mg daily started on 7/11, will attempt to minimize medications as much as possible due to sensitivity and sedation  Overall improving 4. Mood: LCSW to follow for evaluation and support.  5. Neuropsych: This patient is capable of making decisions on her own behalf.  6. Skin/Wound Care: routine pressure relief measures  7. Fluids/Electrolytes/Nutrition: Monitor I/O.   8. Lumbar diskitis: Afebrile on scheduled tylenol. IV Abx per ID.  9. Acute on chronic anemia: Treated with iron and procrit on outpatient basis. Baseline Hgb- 9-10 range? Will continue to monitor serially   Hemoglobin 8.1 on 7/12  Hemoccult reordered, Negative on 7/11  Continue to monitor 10. Symptomatic Bradycardia s/p PPM: Monitor HR bid.   Resolved 11. GIB with thrombocytopenia: Resolved  Cont to monitor 12. New diagnosis T2DM: Hgb A1c- 6.3. Poorly controlled due to recent rounds of steroids--Lantus was titrated upwards for better control. Was not on any medications at home. Monitor BS ac/hs--watch for hypoglycemic episodes  as steroids tapered off.  CBG (last 3)  Recent Labs    06/11/18 2012 06/11/18 2152 06/12/18 0635  GLUCAP 374* 367* 272*   Elevated on 7/11 with steroids, will not make  any further changes until steroids DC'd 13. Neuropathy BLE: Continue Gabapentin bid--monitor for SE.  14. Hyponatremia:   Sodium 132 on 7/12  Added protein supplements with meals. monitor for now.   Continue to monitor 15. Stage IV CKD: Baseline SCr 3.5-4.0. Monitor for SE/asterixis with narcotics.   Creatinine 2.59 on 7/12  Encourage fluids, IVF DC'd on 6/30  Continue to monitor 16. CHF  Monitor daily weights and for signs of overload.   Lasix increased to 80 mg daily on 6/27 (PTCA 80 twice a day), DC'd Filed Weights   06/11/18 0422 06/11/18 1520 06/12/18 0627  Weight: 95.7 kg (210 lb 15.7 oz) 99.2 kg (218 lb 11.1 oz) 96.8 kg (213 lb 6.5 oz)   ? Reliability 17. Morbid obesity  Encourage weight loss 18. Essential hypertension  Continue Norvasc 10 mg daily  Hydralazine 50 mg 3 times a day, increased to 75 on 6/24  Labile on 7/12 19. Hypoalbuminemia  Supplement initiated 20. Hypokalemia, now with hyperkalemia  Potassium 5.0 on 7/12  Continue to monitor 21. AMS: Resolved  Likely medication induced  CXR reviewed, ECG reviewed, head CT reviewed, EEG performed - ? Borderline QTC prolongation  Discussed with pharmacy  TSH, free T4 within acceptable range  Free T3 low  Continues to have low grade fever  UA unremarkable.  Urine culture no growth  Blood cultures no growth 22. Hypothyroidism  Continue meds   See #21 23.  Sleep disturbance  Melatonin initiated on 6/30, increased on 7/1  Improving 24. 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 Leukocytosis: Resolved   WBCs 10.2 on 7/12  Appreciate ID recs  LOS  (Days) 21 A FACE TO FACE EVALUATION WAS PERFORMED   [name] 06/12/2018, 8:14 AM

## 2018-06-13 ENCOUNTER — Inpatient Hospital Stay (HOSPITAL_COMMUNITY): Payer: Medicare Other | Admitting: *Deleted

## 2018-06-13 ENCOUNTER — Inpatient Hospital Stay (HOSPITAL_COMMUNITY): Payer: Medicare Other | Admitting: Physical Therapy

## 2018-06-13 LAB — GLUCOSE, CAPILLARY
GLUCOSE-CAPILLARY: 242 mg/dL — AB (ref 70–99)
GLUCOSE-CAPILLARY: 291 mg/dL — AB (ref 70–99)
GLUCOSE-CAPILLARY: 342 mg/dL — AB (ref 70–99)
Glucose-Capillary: 342 mg/dL — ABNORMAL HIGH (ref 70–99)

## 2018-06-13 NOTE — Progress Notes (Signed)
Subjective/Complaints: No new complaints. Happy with progress. Pain controlled  ROS: Patient denies fever, rash, sore throat, blurred vision, nausea, vomiting, diarrhea, cough, shortness of breath or chest pain,   headache, or mood change.    Objective: Vital Signs: Blood pressure 131/65, pulse 81, temperature 98.6 F (37 C), temperature source Oral, resp. rate 20, height 5' 5"  (1.651 m), weight 98 kg (216 lb 0.8 oz), SpO2 95 %. No results found. Results for orders placed or performed during the hospital encounter of 05/22/18 (from the past 72 hour(s))  Glucose, capillary     Status: Abnormal   Collection Time: 06/10/18 12:01 PM  Result Value Ref Range   Glucose-Capillary 289 (H) 70 - 99 mg/dL  Glucose, capillary     Status: Abnormal   Collection Time: 06/10/18  4:59 PM  Result Value Ref Range   Glucose-Capillary 303 (H) 70 - 99 mg/dL  Glucose, capillary     Status: Abnormal   Collection Time: 06/10/18  9:18 PM  Result Value Ref Range   Glucose-Capillary 309 (H) 70 - 99 mg/dL  Basic metabolic panel     Status: Abnormal   Collection Time: 06/11/18  3:51 AM  Result Value Ref Range   Sodium 132 (L) 135 - 145 mmol/L   Potassium 5.7 (H) 3.5 - 5.1 mmol/L   Chloride 94 (L) 98 - 111 mmol/L    Comment: Please note change in reference range.   CO2 26 22 - 32 mmol/L   Glucose, Bld 311 (H) 70 - 99 mg/dL    Comment: Please note change in reference range.   BUN 95 (H) 8 - 23 mg/dL    Comment: Please note change in reference range.   Creatinine, Ser 2.98 (H) 0.44 - 1.00 mg/dL   Calcium 9.4 8.9 - 10.3 mg/dL   GFR calc non Af Amer 14 (L) >60 mL/min   GFR calc Af Amer 17 (L) >60 mL/min    Comment: (NOTE) The eGFR has been calculated using the CKD EPI equation. This calculation has not been validated in all clinical situations. eGFR's persistently <60 mL/min signify possible Chronic Kidney Disease.    Anion gap 12 5 - 15    Comment: Performed at [facility] [address]  [place], [place] [address]  Glucose, capillary     Status: Abnormal   Collection Time: 06/11/18  6:57 AM  Result Value Ref Range   Glucose-Capillary 274 (H) 70 - 99 mg/dL  Glucose, capillary     Status: Abnormal   Collection Time: 06/11/18 11:24 AM  Result Value Ref Range   Glucose-Capillary 252 (H) 70 - 99 mg/dL  Glucose, capillary     Status: Abnormal   Collection Time: 06/11/18  4:40 PM  Result Value Ref Range   Glucose-Capillary 333 (H) 70 - 99 mg/dL  Occult blood card to lab, stool RN will collect     Status: None   Collection Time: 06/11/18  6:31 PM  Result Value Ref Range   Fecal Occult Bld NEGATIVE NEGATIVE    Comment: Performed at [facility], [place] [address], [place], [place] [address]  Glucose, capillary     Status: Abnormal   Collection Time: 06/11/18  8:12 PM  Result Value Ref Range   Glucose-Capillary 374 (H) 70 - 99 mg/dL  Glucose, capillary     Status: Abnormal   Collection Time: 06/11/18  9:52 PM  Result Value Ref Range   Glucose-Capillary 367 (H) 70 - 99 mg/dL  Basic  metabolic panel     Status: Abnormal   Collection Time: 06/12/18  4:42 AM  Result Value Ref Range   Sodium 132 (L) 135 - 145 mmol/L   Potassium 5.0 3.5 - 5.1 mmol/L   Chloride 96 (L) 98 - 111 mmol/L    Comment: Please note change in reference range.   CO2 25 22 - 32 mmol/L   Glucose, Bld 293 (H) 70 - 99 mg/dL    Comment: Please note change in reference range.   BUN 97 (H) 8 - 23 mg/dL    Comment: Please note change in reference range.   Creatinine, Ser 2.59 (H) 0.44 - 1.00 mg/dL   Calcium 9.5 8.9 - 10.3 mg/dL   GFR calc non Af Amer 17 (L) >60 mL/min   GFR calc Af Amer 20 (L) >60 mL/min    Comment: (NOTE) The eGFR has been calculated using the CKD EPI equation. This calculation has not been validated in all clinical situations. eGFR's persistently <60 mL/min signify possible Chronic Kidney Disease.    Anion gap 11 5 - 15    Comment: Performed at [facility] [address]  [place],  [address]  CBC with Differential/Platelet     Status: Abnormal   Collection Time: 06/12/18  4:42 AM  Result Value Ref Range   WBC 10.2 4.0 - 10.5 K/uL   RBC 2.74 (L) 3.87 - 5.11 MIL/uL   Hemoglobin 8.1 (L) 12.0 - 15.0 g/dL   HCT 25.0 (L) 36.0 - 46.0 %   MCV 91.2 78.0 - 100.0 fL   MCH 29.6 26.0 - 34.0 pg   MCHC 32.4 30.0 - 36.0 g/dL   RDW 15.1 11.5 - 15.5 %   Platelets 334 150 - 400 K/uL   Neutrophils Relative % 79 %   Neutro Abs 8.1 (H) 1.7 - 7.7 K/uL   Lymphocytes Relative 10 %   Lymphs Abs 1.0 0.7 - 4.0 K/uL   Monocytes Relative 6 %   Monocytes Absolute 0.6 0.1 - 1.0 K/uL   Eosinophils Relative 0 %   Eosinophils Absolute 0.0 0.0 - 0.7 K/uL   Basophils Relative 0 %   Basophils Absolute 0.0 0.0 - 0.1 K/uL   Immature Granulocytes 5 %   Abs Immature Granulocytes 0.5 (H) 0.0 - 0.1 K/uL    Comment: Performed at [facility] [address] [place], [place] [address]  Glucose, capillary     Status: Abnormal   Collection Time: 06/12/18  6:35 AM  Result Value Ref Range   Glucose-Capillary 272 (H) 70 - 99 mg/dL  Glucose, capillary     Status: Abnormal   Collection Time: 06/12/18 11:26 AM  Result Value Ref Range   Glucose-Capillary 263 (H) 70 - 99 mg/dL  Glucose, capillary     Status: Abnormal   Collection Time: 06/12/18  4:39 PM  Result Value Ref Range   Glucose-Capillary 293 (H) 70 - 99 mg/dL  Glucose, capillary     Status: Abnormal   Collection Time: 06/12/18  9:22 PM  Result Value Ref Range   Glucose-Capillary 300 (H) 70 - 99 mg/dL  Glucose, capillary     Status: Abnormal   Collection Time: 06/13/18  6:31 AM  Result Value Ref Range   Glucose-Capillary 242 (H) 70 - 99 mg/dL     Constitutional: No distress . Vital signs reviewed. HEENT: EOMI, oral membranes moist Neck: supple Cardiovascular: RRR without murmur. No JVD    Respiratory: CTA Bilaterally without wheezes or  rales. Normal effort    GI: BS +, non-tender, non-distended . Musc: lower ext  edema Neuro: Alert Motor:  Bilateral upper extremities: 4+/5 proximal distal (stable) Bilateral lower extremities: Hip flexion 3+/5, knee extension 3+/5, ankle dorsiflexion 5/5--stable  Skin: Warm and dry. Intact. Psych: Normal mood.  Normal behavior.  Assessment/Plan: 1. Functional deficits secondary to L4-5 Lumbar discitis which require 3+ hours per day of interdisciplinary therapy in a comprehensive inpatient rehab setting. Physiatrist is providing close team supervision and 24 hour management of active medical problems listed below. Physiatrist and rehab team continue to assess barriers to discharge/monitor patient progress toward functional and medical goals. FIM: Function - Bathing Bathing activity did not occur: Refused Position: Bed Body parts bathed by patient: Right arm, Left arm, Chest, Abdomen Body parts bathed by helper: Right lower leg, Left lower leg, Right upper leg, Left upper leg, Back, Front perineal area, Buttocks Assist Level: 2 helpers  Function- Upper Body Dressing/Undressing Upper body dressing/undressing activity did not occur: Refused What is the patient wearing?: Pull over shirt/dress Pull over shirt/dress - Perfomed by patient: Thread/unthread right sleeve, Thread/unthread left sleeve, Pull shirt over trunk Pull over shirt/dress - Perfomed by helper: Put head through opening Orthosis activity level: Performed by helper Assist Level: Touching or steadying assistance(Pt > 75%) Function - Lower Body Dressing/Undressing Lower body dressing/undressing activity did not occur: Refused What is the patient wearing?: Pants, Ted Hose, Non-skid slipper socks Position: Bed Pants- Performed by patient: Thread/unthread right pants leg, Thread/unthread left pants leg(using reacher) Pants- Performed by helper: Pull pants up/down Non-skid slipper socks- Performed by helper: Don/doff right sock, Don/doff left sock Shoes - Performed by helper: Don/doff right shoe, Don/doff  left shoe TED Hose - Performed by helper: Don/doff right TED hose, Don/doff left TED hose Assist for lower body dressing: 2 Helpers  Function - Toileting Toileting activity did not occur: Safety/medical concerns Toileting steps completed by helper: Performs perineal hygiene Assist level: Two helpers  Function - Air cabin crew transfer activity did not occur: Safety/medical concerns Toilet transfer assistive device: Bedside commode, Mechanical lift Mechanical lift: Stedy Assist level to toilet: Maximal assist (Pt 25 - 49%/lift and lower) Assist level from toilet: Maximal assist (Pt 25 - 49%/lift and lower) Assist level to bedside commode (at bedside): 2 helpers(stand pivot) Assist level from bedside commode (at bedside): Moderate assist (Pt 50 - 74%/lift or lower)(Stedy)  Function - Chair/bed transfer Chair/bed transfer activity did not occur: Refused Chair/bed transfer method: Lateral scoot Chair/bed transfer assist level: Touching or steadying assistance (Pt > 75%) Chair/bed transfer assistive device: Sliding board, Armrests Mechanical lift: Stedy Chair/bed transfer details: Verbal cues for precautions/safety, Verbal cues for technique, Verbal cues for sequencing, Tactile cues for weight shifting, Manual facilitation for placement  Function - Locomotion: Wheelchair Type: Manual Wheelchair activity did not occur: Refused Max wheelchair distance: 150 Assist Level: Supervision or verbal cues Wheel 50 feet with 2 turns activity did not occur: Refused Assist Level: Supervision or verbal cues Wheel 150 feet activity did not occur: Refused Assist Level: Supervision or verbal cues Function - Locomotion: Ambulation Ambulation activity did not occur: Safety/medical concerns Assistive device: Parallel bars Max distance: 22f Assist level: Moderate assist (Pt 50 - 74%) Walk 10 feet activity did not occur: Safety/medical concerns Walk 50 feet with 2 turns activity did not occur:  Safety/medical concerns Walk 150 feet activity did not occur: Safety/medical concerns Walk 10 feet on uneven surfaces activity did not occur: Safety/medical concerns  Function - Comprehension Comprehension: Auditory  Comprehension assist level: Follows complex conversation/direction with extra time/assistive device  Function - Expression Expression: Verbal Expression assist level: Expresses complex ideas: With extra time/assistive device  Function - Social Interaction Social Interaction assist level: Interacts appropriately with others with medication or extra time (anti-anxiety, antidepressant).  Function - Problem Solving Problem solving assist level: Solves complex 90% of the time/cues < 10% of the time  Function - Memory Memory assist level: Recognizes or recalls 90% of the time/requires cueing < 10% of the time Patient normally able to recall (first 3 days only): Current season, Location of own room, Staff names and faces  Medical Problem List and Plan:  1. Functional deficits secondary to L4-5 osteomyelitis/discitis, lumbar spondylosis   Continue CIR  Xrays stable 2. DVT Prophylaxis/Anticoagulation: Mechanical: Sequential compression devices, below knee Bilateral lower extremities   Doppler negative for DVT in LE  LUE doppler ordered to evaluate for DVT, negative 3. Pain Management: Scheduled oxycodone prior to therapy to help with activity tolerance, decreased to 5 mg on 6/27.   Added ultram prn for moderate pain. Monitor and titrate medications to help with neuropathic symptoms. Decadron d/ced.   RLE radicular pain, partially improved on gabapentin to 668m BID, decreased to 300 mg twice a day, DC'd on 6/27 due to lethargy and tremors. Gabapentin 100 3 times a day started on/3   Low dose dexamethasone DC'd on 6/26  Baclofen changed to 5 mg daily at bedtime, patient unable to tolerate higher doses, DC'd on 6/27 due to lethargy  Lidoderm patch added on 7/2  Tizanidine 2 mg  daily started on 7/11, will attempt to minimize medications as much as possible due to sensitivity and sedation  Pain improving 4. Mood: LCSW to follow for evaluation and support.  5. Neuropsych: This patient is capable of making decisions on her own behalf.  6. Skin/Wound Care: routine pressure relief measures  7. Fluids/Electrolytes/Nutrition: Monitor I/O.   8. Lumbar diskitis: Afebrile on scheduled tylenol. IV Abx per ID.  9. Acute on chronic anemia: Treated with iron and procrit on outpatient basis. Baseline Hgb- 9-10 range? Will continue to monitor serially   Hemoglobin 8.1 on 7/12  Hemoccult reordered, Negative on 7/11  Continue to monitor 10. Symptomatic Bradycardia s/p PPM: Monitor HR bid.   Resolved 11. GIB with thrombocytopenia: Resolved  Cont to monitor 12. New diagnosis T2DM: Hgb A1c- 6.3. Poorly controlled due to recent rounds of steroids--Lantus was titrated upwards for better control. Was not on any medications at home. Monitor BS ac/hs--watch for hypoglycemic episodes as steroids tapered off.  CBG (last 3)  Recent Labs    06/12/18 1639 06/12/18 2122 06/13/18 0631  GLUCAP 293* 300* 242*   Elevated on 7/13 with steroids, will not make any further changes until steroids DC'd (tapering down) 13. Neuropathy BLE: Continue Gabapentin bid--monitor for SE.  14. Hyponatremia:   Sodium 132 on 7/12  Added protein supplements with meals. monitor for now.   Continue to monitor 15. Stage IV CKD: Baseline SCr 3.5-4.0. Monitor for SE/asterixis with narcotics.   Creatinine 2.59 on 7/12  Encourage fluids, IVF DC'd on 6/30  Continue to monitor 16. CHF  Monitor daily weights and for signs of overload.   Lasix increased to 80 mg daily on 6/27 (PTCA 80 twice a day), DC'd Filed Weights   06/11/18 1520 06/12/18 0627 06/13/18 0300  Weight: 99.2 kg (218 lb 11.1 oz) 96.8 kg (213 lb 6.5 oz) 98 kg (216 lb 0.8 oz)   ? Reliability 17. Morbid obesity  Encourage weight loss 18. Essential  hypertension  Continue Norvasc 10 mg daily  Hydralazine 50 mg 3 times a day, increased to 75 on 6/24  Improved control 7/13 19. Hypoalbuminemia  Supplement initiated 20. Hypokalemia, now with hyperkalemia  Potassium 5.0 on 7/12  Continue to monitor 21. AMS: Resolved  Likely medication induced  CXR reviewed, ECG reviewed, head CT reviewed, EEG performed - ? Borderline QTC prolongation  Discussed with pharmacy  TSH, free T4 within acceptable range  Free T3 low  Continues to have low grade fever  UA unremarkable.  Urine culture no growth  Blood cultures no growth 22. Hypothyroidism  Continue meds   See #21 23.  Sleep disturbance  Melatonin initiated on 6/30, increased on 7/1  Improving 24. 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 Leukocytosis: Resolved   WBCs 10.2 on 7/12  Appreciate ID recs  LOS (Days) 22 A FACE TO FACE EVALUATION WAS PERFORMED  Meredith Staggers 06/13/2018, 8:31 AM

## 2018-06-13 NOTE — Progress Notes (Signed)
Physical Therapy Session Note  Patient Details  Name: Erika Fry MRN: [id number] Date of Birth: July 10, 1946  Today's Date: 06/13/2018 PT Individual Time: 1345-1500 PT Individual Time Calculation (min): 75 min   Short Term Goals: Week 4:  PT Short Term Goal 1 (Week 4): STG =LTG due to ELOS    Tx focused on functional mobility training, therex for strengthening, and gait with RW. Pt up in bed, ready to go.  Reviewed issued HEP, but pt needed max cues. HEP progressed with new handout and modified bolster for independnece.  Bil x10 each of the followign with AAROM prn: ankel pumps, glute sets, heel slides, SAQ, hip ADD Supine>sit with Mod A and max cue to complete log roll prior to sit. Assist for brace edge of bed.  Sit<>stand up to Max A and cues for technique Bed>chair with RW and up to Mod A and cues for quad activation with stance leg.  Gait with RW in hall +2 close WC follow x6' with manual faciliation for knee ext on stance leg Seated therex: LAQ with 5# weight and marching bil x10 Return to bed same as above. Pt encouraged for as much time OOB as possible.  Pain 5/10 at start and end of tx, pt premedicated and modified tx prn.    Skilled Therapeutic Interventions/Progress Updates:      Therapy Documentation Precautions:  Precautions Precautions: Fall Precaution Comments: Pacemaker placement 4 weeks ago Required Braces or Orthoses: Spinal Brace Spinal Brace: Lumbar corset Restrictions Weight Bearing Restrictions: No General:   Vital Signs: Therapy Vitals BP: 134/68   See Function Navigator for Current Functional Status.   Therapy/Group: Individual Therapy  Mica Ramdass Soundra Pilon, PT, DPT  06/13/2018, 2:28 PM

## 2018-06-13 NOTE — Progress Notes (Signed)
Physical Therapy Session Note  Patient Details  Name: Erika Fry MRN: [id number] Date of Birth: 07/07/1946  Today's Date: 06/13/2018 PT Individual Time: 1000-1100 PT Individual Time Calculation (min): 60 min   Short Term Goals: Week 4:  PT Short Term Goal 1 (Week 4): STG =LTG due to ELOS    Skilled Therapeutic Interventions/Progress Updates:    Pt received semi-reclined in bed, agreeable to PT. No complaints of pain. Semi-reclined to sitting EOB with mod A for BLE management. Pt requires assist to don shirt and pants while seated EOB, mod A to stand from elevated bed to stedy to pull pants up. Stedy transfer bed to w/c. Sit to stand with max A to RW from w/c height. SPT w/c to/from mat table with mod A and use of RW for balance. Sit to stand x 3 reps from elevated mat table to RW with max A. SPT w/c to bed with mod A and RW. Sit to supine mod A for BLE management. Pt left semi-reclined in bed with needs in reach.  Therapy Documentation Precautions:  Precautions Precautions: Fall Precaution Comments: Pacemaker placement 4 weeks ago Required Braces or Orthoses: Spinal Brace Spinal Brace: Lumbar corset Restrictions Weight Bearing Restrictions: No  See Function Navigator for Current Functional Status.   Therapy/Group: Individual Therapy  Excell Seltzer, PT, DPT  06/13/2018, 12:08 PM

## 2018-06-14 ENCOUNTER — Inpatient Hospital Stay (HOSPITAL_COMMUNITY): Payer: Medicare Other

## 2018-06-14 LAB — GLUCOSE, CAPILLARY
GLUCOSE-CAPILLARY: 249 mg/dL — AB (ref 70–99)
GLUCOSE-CAPILLARY: 303 mg/dL — AB (ref 70–99)
Glucose-Capillary: 218 mg/dL — ABNORMAL HIGH (ref 70–99)
Glucose-Capillary: 268 mg/dL — ABNORMAL HIGH (ref 70–99)

## 2018-06-14 LAB — OCCULT BLOOD X 1 CARD TO LAB, STOOL: FECAL OCCULT BLD: NEGATIVE

## 2018-06-14 NOTE — Progress Notes (Signed)
Subjective/Complaints: Continues to be in good spirits and very motivated.  Pleased with progress and the therapy team.  ROS: Patient denies fever, rash, sore throat, blurred vision, nausea, vomiting, diarrhea, cough, shortness of breath or chest pain, joint or back pain, headache, or mood change.   Objective: Vital Signs: Blood pressure (!) 146/71, pulse 80, temperature 97.6 F (36.4 C), temperature source Oral, resp. rate 19, height _0  (1.651 m), weight 97.2 kg (214 lb 4.6 oz), SpO2 96 %. No results found. Results for orders placed or performed during the hospital encounter of 05/22/18 (from the past 72 hour(s))  Glucose, capillary     Status: Abnormal   Collection Time: 06/11/18 11:24 AM  Result Value Ref Range   Glucose-Capillary 252 (H) 70 - 99 mg/dL  Glucose, capillary     Status: Abnormal   Collection Time: 06/11/18  4:40 PM  Result Value Ref Range   Glucose-Capillary 333 (H) 70 - 99 mg/dL  Occult blood card to lab, stool RN will collect     Status: None   Collection Time: 06/11/18  6:31 PM  Result Value Ref Range   Fecal Occult Bld NEGATIVE NEGATIVE    Comment: Performed at [facility], [address] [place], [place] [address]  Glucose, capillary     Status: Abnormal   Collection Time: 06/11/18  8:12 PM  Result Value Ref Range   Glucose-Capillary 374 (H) 70 - 99 mg/dL  Glucose, capillary     Status: Abnormal   Collection Time: 06/11/18  9:52 PM  Result Value Ref Range   Glucose-Capillary 367 (H) 70 - 99 mg/dL  Basic metabolic panel     Status: Abnormal   Collection Time: 06/12/18  4:42 AM  Result Value Ref Range   Sodium 132 (L) 135 - 145 mmol/L   Potassium 5.0 3.5 - 5.1 mmol/L   Chloride 96 (L) 98 - 111 mmol/L    Comment: Please note change in reference range.   CO2 25 22 - 32 mmol/L   Glucose, Bld 293 (H) 70 - 99 mg/dL    Comment: Please note change in reference range.   BUN 97 (H) 8 - 23 mg/dL    Comment: Please note change in reference range.    Creatinine, Ser 2.59 (H) 0.44 - 1.00 mg/dL   Calcium 9.5 8.9 - 10.3 mg/dL   GFR calc non Af Amer 17 (L) >60 mL/min   GFR calc Af Amer 20 (L) >60 mL/min    Comment: (NOTE) The eGFR has been calculated using the CKD EPI equation. This calculation has not been validated in all clinical situations. eGFR's persistently <60 mL/min signify possible Chronic Kidney Disease.    Anion gap 11 5 - 15    Comment: Performed at [facility] [address] [place], [place] [address]  CBC with Differential/Platelet     Status: Abnormal   Collection Time: 06/12/18  4:42 AM  Result Value Ref Range   WBC 10.2 4.0 - 10.5 K/uL   RBC 2.74 (L) 3.87 - 5.11 MIL/uL   Hemoglobin 8.1 (L) 12.0 - 15.0 g/dL   HCT 25.0 (L) 36.0 - 46.0 %   MCV 91.2 78.0 - 100.0 fL   MCH 29.6 26.0 - 34.0 pg   MCHC 32.4 30.0 - 36.0 g/dL   RDW 15.1 11.5 - 15.5 %   Platelets 334 150 - 400 K/uL   Neutrophils Relative % 79 %   Neutro Abs 8.1 (H) 1.7 - 7.7 K/uL  Lymphocytes Relative 10 %   Lymphs Abs 1.0 0.7 - 4.0 K/uL   Monocytes Relative 6 %   Monocytes Absolute 0.6 0.1 - 1.0 K/uL   Eosinophils Relative 0 %   Eosinophils Absolute 0.0 0.0 - 0.7 K/uL   Basophils Relative 0 %   Basophils Absolute 0.0 0.0 - 0.1 K/uL   Immature Granulocytes 5 %   Abs Immature Granulocytes 0.5 (H) 0.0 - 0.1 K/uL    Comment: Performed at [facility] [address], [place], [place] [address]  Glucose, capillary     Status: Abnormal   Collection Time: 06/12/18  6:35 AM  Result Value Ref Range   Glucose-Capillary 272 (H) 70 - 99 mg/dL  Glucose, capillary     Status: Abnormal   Collection Time: 06/12/18 11:26 AM  Result Value Ref Range   Glucose-Capillary 263 (H) 70 - 99 mg/dL  Glucose, capillary     Status: Abnormal   Collection Time: 06/12/18  4:39 PM  Result Value Ref Range   Glucose-Capillary 293 (H) 70 - 99 mg/dL  Glucose, capillary     Status: Abnormal   Collection Time: 06/12/18  9:22 PM  Result Value Ref Range    Glucose-Capillary 300 (H) 70 - 99 mg/dL  Glucose, capillary     Status: Abnormal   Collection Time: 06/13/18  6:31 AM  Result Value Ref Range   Glucose-Capillary 242 (H) 70 - 99 mg/dL  Glucose, capillary     Status: Abnormal   Collection Time: 06/13/18 11:34 AM  Result Value Ref Range   Glucose-Capillary 291 (H) 70 - 99 mg/dL  Glucose, capillary     Status: Abnormal   Collection Time: 06/13/18  5:11 PM  Result Value Ref Range   Glucose-Capillary 342 (H) 70 - 99 mg/dL  Glucose, capillary     Status: Abnormal   Collection Time: 06/13/18  9:25 PM  Result Value Ref Range   Glucose-Capillary 342 (H) 70 - 99 mg/dL  Glucose, capillary     Status: Abnormal   Collection Time: 06/14/18  6:35 AM  Result Value Ref Range   Glucose-Capillary 249 (H) 70 - 99 mg/dL     Constitutional: No distress . Vital signs reviewed. HEENT: EOMI, oral membranes moist Neck: supple Cardiovascular: RRR without murmur. No JVD    Respiratory: CTA Bilaterally without wheezes or rales. Normal effort    GI: BS +, non-tender, non-distended  Musc: lower ext edemaPersistent Neuro: Alert Motor:  Bilateral upper extremities: 4+/5 proximal distal (stable) Bilateral lower extremities: Hip flexion 3+/5, knee extension 3+/5, ankle dorsiflexion 5/5--stable Skin: Warm and dry. Intact. Psych: Normal mood.  Normal behavior.  Assessment/Plan: 1. Functional deficits secondary to L4-5 Lumbar discitis which require 3+ hours per day of interdisciplinary therapy in a comprehensive inpatient rehab setting. Physiatrist is providing close team supervision and 24 hour management of active medical problems listed below. Physiatrist and rehab team continue to assess barriers to discharge/monitor patient progress toward functional and medical goals. FIM: Function - Bathing Bathing activity did not occur: Refused Position: Bed Body parts bathed by patient: Right arm, Left arm, Chest, Abdomen Body parts bathed by helper: Right lower  leg, Left lower leg, Right upper leg, Left upper leg, Back, Front perineal area, Buttocks Assist Level: 2 helpers  Function- Upper Body Dressing/Undressing Upper body dressing/undressing activity did not occur: Refused What is the patient wearing?: Pull over shirt/dress Pull over shirt/dress - Perfomed by patient: Thread/unthread right sleeve, Thread/unthread left sleeve, Pull shirt over trunk Pull  over shirt/dress - Perfomed by helper: Put head through opening Orthosis activity level: Performed by helper Assist Level: Touching or steadying assistance(Pt > 75%) Function - Lower Body Dressing/Undressing Lower body dressing/undressing activity did not occur: Refused What is the patient wearing?: Pants, Ted Hose, Non-skid slipper socks Position: Bed Pants- Performed by patient: Thread/unthread right pants leg, Thread/unthread left pants leg(using reacher) Pants- Performed by helper: Pull pants up/down Non-skid slipper socks- Performed by helper: Don/doff right sock, Don/doff left sock Shoes - Performed by helper: Don/doff right shoe, Don/doff left shoe TED Hose - Performed by helper: Don/doff right TED hose, Don/doff left TED hose Assist for lower body dressing: 2 Helpers  Function - Toileting Toileting activity did not occur: Safety/medical concerns Toileting steps completed by patient: Adjust clothing prior to toileting, Performs perineal hygiene, Adjust clothing after toileting Toileting steps completed by helper: Performs perineal hygiene Assist level: Two helpers  Function - Air cabin crew transfer activity did not occur: Safety/medical concerns Toilet transfer assistive device: Bedside commode, Mechanical lift Mechanical lift: Stedy Assist level to toilet: Maximal assist (Pt 25 - 49%/lift and lower) Assist level from toilet: Maximal assist (Pt 25 - 49%/lift and lower) Assist level to bedside commode (at bedside): 2 helpers(stand pivot) Assist level from bedside commode  (at bedside): Moderate assist (Pt 50 - 74%/lift or lower)(Stedy)  Function - Chair/bed transfer Chair/bed transfer activity did not occur: Refused Chair/bed transfer method: Stand pivot Chair/bed transfer assist level: Moderate assist (Pt 50 - 74%/lift or lower) Chair/bed transfer assistive device: Walker, Armrests Mechanical lift: Stedy Chair/bed transfer details: Verbal cues for precautions/safety, Verbal cues for technique, Verbal cues for sequencing, Tactile cues for weight shifting, Manual facilitation for placement, Manual facilitation for weight shifting  Function - Locomotion: Wheelchair Type: Manual Wheelchair activity did not occur: Refused Max wheelchair distance: 150 Assist Level: Supervision or verbal cues Wheel 50 feet with 2 turns activity did not occur: Refused Assist Level: Supervision or verbal cues Wheel 150 feet activity did not occur: Refused Assist Level: Supervision or verbal cues Function - Locomotion: Ambulation Ambulation activity did not occur: Safety/medical concerns Assistive device: Walker-rolling Max distance: 6' Assist level: 2 helpers Walk 10 feet activity did not occur: Safety/medical concerns Walk 50 feet with 2 turns activity did not occur: Safety/medical concerns Walk 150 feet activity did not occur: Safety/medical concerns Walk 10 feet on uneven surfaces activity did not occur: Safety/medical concerns  Function - Comprehension Comprehension: Auditory Comprehension assist level: Follows complex conversation/direction with extra time/assistive device  Function - Expression Expression: Verbal Expression assist level: Expresses complex ideas: With extra time/assistive device  Function - Social Interaction Social Interaction assist level: Interacts appropriately with others with medication or extra time (anti-anxiety, antidepressant).  Function - Problem Solving Problem solving assist level: Solves complex 90% of the time/cues < 10% of the  time  Function - Memory Memory assist level: Recognizes or recalls 90% of the time/requires cueing < 10% of the time Patient normally able to recall (first 3 days only): Current season, Location of own room, Staff names and faces, That he or she is in a hospital  Medical Problem List and Plan:  1. Functional deficits secondary to L4-5 osteomyelitis/discitis, lumbar spondylosis   Continue CIR  Xrays stable 2. DVT Prophylaxis/Anticoagulation: Mechanical: Sequential compression devices, below knee Bilateral lower extremities   Doppler negative for DVT in LE  LUE doppler ordered to evaluate for DVT, negative 3. Pain Management: Scheduled oxycodone prior to therapy to help with activity tolerance, decreased to 5  mg on 6/27.   Added ultram prn for moderate pain. Monitor and titrate medications to help with neuropathic symptoms. Decadron d/ced.   RLE radicular pain, partially improved on gabapentin to 692m BID, decreased to 300 mg twice a day, DC'd on 6/27 due to lethargy and tremors. Gabapentin 100 3 times a day started on/3   Low dose dexamethasone DC'd on 6/26  Baclofen changed to 5 mg daily at bedtime, patient unable to tolerate higher doses, DC'd on 6/27 due to lethargy  Lidoderm patch added on 7/2  Tizanidine 2 mg daily started on 7/11, will attempt to minimize medications as much as possible due to sensitivity and sedation  Pain pain is controlled 4. Mood: LCSW to follow for evaluation and support.  5. Neuropsych: This patient is capable of making decisions on her own behalf.  6. Skin/Wound Care: routine pressure relief measures  7. Fluids/Electrolytes/Nutrition: Monitor I/O.   -Recheck labs tomorrow 8. Lumbar diskitis: Afebrile on scheduled tylenol. IV Abx per ID.  9. Acute on chronic anemia: Treated with iron and procrit on outpatient basis. Baseline Hgb- 9-10 range? Will continue to monitor serially   Hemoglobin 8.1 on 7/12  Hemoccult reordered, Negative on 7/11  Continue to  monitor 10. Symptomatic Bradycardia s/p PPM: Monitor HR bid.   Resolved 11. GIB with thrombocytopenia: Resolved  Cont to monitor 12. New diagnosis T2DM: Hgb A1c- 6.3. Poorly controlled due to recent rounds of steroids--Lantus was titrated upwards for better control. Was not on any medications at home. Monitor BS ac/hs--watch for hypoglycemic episodes as steroids tapered off.  CBG (last 3)  Recent Labs    06/13/18 1711 06/13/18 2125 06/14/18 0635  GLUCAP 342* 342* 249*   CBGs remain elevated with steroid taper.  Continue sliding scale insulin for coverage for now, however may need to increase scheduled Lantus in the meantime as well. 13. Neuropathy BLE: Continue Gabapentin bid--monitor for SE.  14. Hyponatremia:   Sodium 132 on 7/12  Added protein supplements with meals. monitor for now.   Continue to monitor 15. Stage IV CKD: Baseline SCr 3.5-4.0. Monitor for SE/asterixis with narcotics.   Creatinine 2.59 on 7/12  Encourage fluids, IVF DC'd on 6/30  Continue to monitor 16. CHF  Monitor daily weights and for signs of overload.   Lasix increased to 80 mg daily on 6/27 (PTCA 80 twice a day), DC'd Filed Weights   06/12/18 0627 06/13/18 0300 06/14/18 0538  Weight: 96.8 kg (213 lb 6.5 oz) 98 kg (216 lb 0.8 oz) 97.2 kg (214 lb 4.6 oz)   Weights appear generally stable 17. Morbid obesity  Encourage weight loss 18. Essential hypertension  Continue Norvasc 10 mg daily  Hydralazine 50 mg 3 times a day, increased to 75 on 6/24  Improved control 7/14 19. Hypoalbuminemia  Supplement initiated 20. Hypokalemia, now with hyperkalemia  Potassium 5.0 on 7/12  Continue to monitor 21. AMS: Resolved  Likely medication induced  CXR reviewed, ECG reviewed, head CT reviewed, EEG performed - ? Borderline QTC prolongation  Discussed with pharmacy  TSH, free T4 within acceptable range  Free T3 low  Continues to have low grade fever  UA unremarkable.  Urine culture no growth  Blood cultures no  growth 22. Hypothyroidism  Continue meds   See #21 23.  Sleep disturbance  Melatonin initiated on 6/30, increased on 7/1  Improving 24. 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 Leukocytosis: Resolved   WBCs 10.2 on 7/12  Appreciate ID recs  LOS (Days) 23 A FACE TO FACE EVALUATION WAS PERFORMED  Meredith Staggers 06/14/2018, 8:15 AM

## 2018-06-14 NOTE — Progress Notes (Signed)
Physical Therapy Session Note  Patient Details  Name: Erika Fry MRN: [id number] Date of Birth: [birth year]  Today's Date: 06/14/2018 PT Individual Time: 1100-1155 PT Individual Time Calculation (min): 55 min   Short Term Goals: Week 4:  PT Short Term Goal 1 (Week 4): STG =LTG due to ELOS    Skilled Therapeutic Interventions/Progress Updates:    Pt supine in bed upon PT arrival, agreeable to therapy tx and denies pain at rest. Therapist donned teds and socks total assist for time management. Pt performed rolling in each direction with min assist in order to don shorts. Pt transferred from supine>sidelying>sitting with mod assist, emphasis on techniques. Pt performed sit<>stand with RW and mod assist, stand pivot transfer with min assist and RW to w/c. Pt transported to the gym. 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 Pt requiring mod assist for LE management to bring LE's into the car, also discussing putting feet up on a step after sitting in order to scoot posteriorly into the car. Pt transferred back to w/c with RW and mod assist. Pt transported to dayroom. Pt ambulated x 22 ft with RW and mod assist +2 for w/c follow secondary to occasional knee buckling. Pt transported back to room and left seated in w/c with needs in reach and family present.   Therapy Documentation Precautions:  Precautions Precautions: Fall Precaution Comments: Pacemaker placement 4 weeks ago Required Braces or Orthoses: Spinal Brace Spinal Brace: Lumbar corset Restrictions Weight Bearing Restrictions: No   See Function Navigator for Current Functional Status.   Therapy/Group: Individual Therapy  Netta Corrigan, PT, DPT 06/14/2018, 7:49 AM

## 2018-06-14 NOTE — Progress Notes (Signed)
Occupational Therapy Session Note  Patient Details  Name: Erika Fry MRN: [id number] Date of Birth: September 15, 1946  Today's Date: 06/14/2018 OT Individual Time: 1300-1415 OT Individual Time Calculation (min): 75 min    Skilled Therapeutic Interventions/Progress Updates:    1;1. Pt with no c/o pain. Pt completes stand pivot transfers throughout session with RW and VC for technique/reach back transfering to [facility] and shower stall with MOD A sit to stand and steadying once on feet. Pt requires incrased time for rest breaks after mobility. Pt completes standing at high low table with MOD A for lifting and steadying A for static balance while completing clothing fasteners for BUE coordinatiion. Pt requries VC for equal weight bearing over BLE. Pt trials shoe horn v shoe funnel with VC for AE technique and pt prefers reacher/shoe funnel combo. Exited sesion with pt seated in w/c, call light in reach and all needs met  Therapy Documentation Precautions:  Precautions Precautions: Fall Precaution Comments: Pacemaker placement 4 weeks ago Required Braces or Orthoses: Spinal Brace Spinal Brace: Lumbar corset Restrictions Weight Bearing Restrictions: No General:    See Function Navigator for Current Functional Status.   Therapy/Group: Individual Therapy  Tonny Branch 06/14/2018, 2:16 PM

## 2018-06-15 ENCOUNTER — Inpatient Hospital Stay (HOSPITAL_COMMUNITY): Payer: Medicare Other

## 2018-06-15 ENCOUNTER — Inpatient Hospital Stay (HOSPITAL_COMMUNITY): Payer: Medicare Other | Admitting: Occupational Therapy

## 2018-06-15 DIAGNOSIS — R52 Pain, unspecified: Secondary | ICD-10-CM

## 2018-06-15 LAB — BASIC METABOLIC PANEL
Anion gap: 8 (ref 5–15)
BUN: 89 mg/dL — AB (ref 8–23)
CHLORIDE: 101 mmol/L (ref 98–111)
CO2: 26 mmol/L (ref 22–32)
CREATININE: 2.21 mg/dL — AB (ref 0.44–1.00)
Calcium: 9.4 mg/dL (ref 8.9–10.3)
GFR, EST AFRICAN AMERICAN: 24 mL/min — AB (ref >60)
GFR, EST NON AFRICAN AMERICAN: 21 mL/min — AB (ref >60)
Glucose, Bld: 246 mg/dL — ABNORMAL HIGH (ref 70–99)
POTASSIUM: 4.9 mmol/L (ref 3.5–5.1)
SODIUM: 135 mmol/L (ref 135–145)

## 2018-06-15 LAB — GLUCOSE, CAPILLARY
GLUCOSE-CAPILLARY: 195 mg/dL — AB (ref 70–99)
GLUCOSE-CAPILLARY: 215 mg/dL — AB (ref 70–99)
GLUCOSE-CAPILLARY: 297 mg/dL — AB (ref 70–99)
Glucose-Capillary: 305 mg/dL — ABNORMAL HIGH (ref 70–99)

## 2018-06-15 MED ORDER — FUROSEMIDE 40 MG PO TABS
40.0000 mg | ORAL_TABLET | Freq: Every day | ORAL | Status: DC
  Administered 2018-06-16 – 2018-06-17 (×2): 40 mg via ORAL
  Filled 2018-06-15 (×2): qty 1

## 2018-06-15 MED ORDER — FUROSEMIDE 10 MG/ML IJ SOLN
40.0000 mg | Freq: Once | INTRAMUSCULAR | Status: AC
  Administered 2018-06-15: 40 mg via INTRAVENOUS
  Filled 2018-06-15: qty 4

## 2018-06-15 MED ORDER — FUROSEMIDE 20 MG PO TABS: 20.0000 mg | ORAL_TABLET | Freq: Every day | ORAL | Status: DC

## 2018-06-15 NOTE — Progress Notes (Signed)
Per Algis Liming, PA new order for lasix 20mg  daily starting 7-16, a one time order for 40mg  lasix IV at 1800, and a order for a chest x ray. Monitoring edema and fluid retention.

## 2018-06-15 NOTE — Progress Notes (Signed)
Occupational Therapy Session Note  Patient Details  Name: Erika Fry MRN: [id number] Date of Birth: [birth year]  Today's Date: 06/15/2018 OT Individual Time: 0830-0900 OT Individual Time Calculation (min): 30 min    Short Term Goals: Week 1:  OT Short Term Goal 1 (Week 1): Pt will report no more than 5/10 pain in order to participate more functionally in ADLs OT Short Term Goal 1 - Progress (Week 1): Not met OT Short Term Goal 2 (Week 1): Pt will sit EOB for >10 min with no more than mod A to participate in morning ADL routine OT Short Term Goal 2 - Progress (Week 1): Not met OT Short Term Goal 3 (Week 1): Pt will don shirt with min A  OT Short Term Goal 3 - Progress (Week 1): Not met OT Short Term Goal 4 (Week 1): Pt will adhere to back precautions during bed mobility with no more than 2 vc  OT Short Term Goal 4 - Progress (Week 1): Met Week 2:  OT Short Term Goal 1 (Week 2): Pt will move from sidelying to sit with mod A of 1 to prepare for bathing/dressing EOB or in wc. OT Short Term Goal 1 - Progress (Week 2): Met OT Short Term Goal 2 (Week 2): Pt will maintain static sitting EOB with min A for 5-10 min. OT Short Term Goal 2 - Progress (Week 2): Met OT Short Term Goal 3 (Week 2): Pt will don shirt with min A. OT Short Term Goal 3 - Progress (Week 2): Met OT Short Term Goal 4 (Week 2): Pt will sit to stand in bariatric Stedy with mod A of 1 to prepare for transfer to w/c. OT Short Term Goal 4 - Progress (Week 2): Met Week 3:  OT Short Term Goal 1 (Week 3): STGs = LTGs (LTGs downgraded 06/10/18)      Skilled Therapeutic Interventions/Progress Updates:    Pt received in bed and agreeable to working on LB dressing skills. Pt sat to EOB with steadying A to bring R hip further forward to EOB.  She then completed sit >< stand with RW with min A using L hand to push up with and R hand on walker.  Attempted multiple times to have pt push up with B hands and then reach for the walker.   She was able to push up and lift hips off bed but had difficulty with sequencing of extending hips at the same time she was transferring her hands to the walker.  In standing, she was able to pull shorts down and then sat down to doff over feet with reacher. She donned shorts again over feet with reacher with min cues for strategy and then stood with RW with min A to pull over hips. Cues to use one hand at a time, alternating hands.  Pt then completed stand pivot with RW to w/c.  Pt set up at sink to complete grooming.    Therapy Documentation Precautions:  Precautions Precautions: Fall Precaution Comments: Pacemaker placement 4 weeks ago Required Braces or Orthoses: Spinal Brace Spinal Brace: Lumbar corset Restrictions Weight Bearing Restrictions: No   Pain: Pain Assessment Pain Score: 7  Pain Type: Acute pain Pain Location: Hip Pain Orientation: Right Pain Descriptors / Indicators: Aching;Discomfort Pain Intervention(s): Medication (See eMAR) ADL: ADL ADL Comments: See functional navigator  See Function Navigator for Current Functional Status.   Therapy/Group: Individual Therapy  [place] 06/15/2018, 10:04 AM

## 2018-06-15 NOTE — Progress Notes (Signed)
Occupational Therapy Session Note  Patient Details  Name: Erika Fry MRN: [id number] Date of Birth: 1946-03-11  Today's Date: 06/15/2018 OT Individual Time: 1100-1200 OT Individual Time Calculation (min): 60 min    Short Term Goals: Week 3:  OT Short Term Goal 1 (Week 3): STGs = LTGs (LTGs downgraded 06/10/18)  Skilled Therapeutic Interventions/Progress Updates:    Treatment session with focus on LB dressing and sit > stand to decrease burden of care with ADLs.  Pt received upright in w/c reporting donning pants this AM with other OT.  Educated on use of sock aid with pt able to don Rt sock with setup assist and cues for technique.  Stand pivot transfer w/c > therapy mat with min assist for sit > stand and min assist transfer with RW.  Cues for hand placement to increase weight shift and improve lift off from w/c.  Engaged in tricep pushups from yoga blocks on mat with pt completed 3 sets of 5 with focus on weight shift and lift off as needed for sit > stand.  Pt completed sit > stand x2 with min assist and improved weight shift post tricep pushups.  Pt required frequent rest breaks during activity.  Returned to w/c sit > stand and stand pivot with min assist.  Pt left seated upright in w/c with all needs in reach and lunch tray setup.  Therapy Documentation Precautions:  Precautions Precautions: Fall Precaution Comments: Pacemaker placement 4 weeks ago Required Braces or Orthoses: Spinal Brace Spinal Brace: Lumbar corset Restrictions Weight Bearing Restrictions: No Pain: Pt with no c/o pain  See Function Navigator for Current Functional Status.   Therapy/Group: Individual Therapy  Simonne Come 06/15/2018, 12:17 PM

## 2018-06-15 NOTE — Progress Notes (Signed)
RN called Algis Liming, PA with MD, Posey Pronto beside PA regarding pts increased weight and increased swelling in hands and legs,. Pt has evident increased pitting edema to BUE and BLEs. PA to assess chart and talk with MD regarding concerns.

## 2018-06-15 NOTE — Progress Notes (Addendum)
Physical Therapy Session Note  Patient Details  Name: Erika Fry MRN: [id number] Date of Birth: July 07, 1946  Today's Date: 06/15/2018 PT Individual Time: 0905-0935 PT Individual Time Calculation (min): 30 min   Short Term Goals:  Week 4:  PT Short Term Goal 1 (Week 4): STG =LTG due to ELOS    Skilled Therapeutic Interventions/Progress Updates:   Pt stated she needed to urinate.  W/c>< BSC over toilet with RW, mod assist.  Continent of urine.  1 LOB forward as she stood up from toilet, due to R hip pain; recovered with mod assistance. Pt needed assistance moving RW in confined space.   Hand hygiene from w/c level independently.  Seated neuro re-ed via multimodal cues, demo for:  10 x 1 each R/L hip flexion, bil hip adduction with core activation with 1UE support; 2 x 10 each (with RW in front of her to encourage upright sitting)-bil heel raises, toe raises. Focus on increasing AROM excursion, and eccentric control  Pt left resting in w/c with all needs within reach, and [place], RN in room.     Therapy Documentation Precautions:  Precautions Precautions: Fall Precaution Comments: Pacemaker placement 4 weeks ago Required Braces or Orthoses: Spinal Brace Spinal Brace: Lumbar corset Restrictions Weight Bearing Restrictions: No   Pain: Pain Assessment AM tx Pain Score: 3  Pain Type: Acute pain Pain Location: Hip Pain Orientation: Right Pain Descriptors / Indicators: Aching;Discomfort Pain Intervention(s): Medication (See eMAR)  tx 2:  Pain: 7/10 R hip, sharp, premedicated and repositioned  Pt's 4 extremities noted to be significantly edematous.  R TED was constricting mid-calf.  PT adjusted TEDS. Pt stated MD and Pryor Montes RN are aware of edema.  W/c propulsion over level tile x 100' with extra time, supervision with cues for techniques and turns. Cues for brakes intermittently.  Neuromuscular re-education via forced use, multimodal cues for bil LEs alternating  reciprocal movement, targeting quadriceps muscles at 40 cm/sec resistance; targeting gluteal muscles at 50 cm/sec x 20 cycles x 2. Scooting forward/backward in w/c without assistance.  Stand pivot to L to return to bed, with max assist to stand up, min assist once standing.  PT instructed pt's son in assisting her into supine.  Pt scooted to [facility] using head board and assistance, in flat bed. Pt stated that she slept in a recliner at home.   Pt c/o R hip pain, and requested heat pack.  PT placed under R buttock and thigh, reminding pt to remove or add a towel if it gets too hot.    Pt left resting in bed with all needs at hand, and bed alarm set.  Son present.     See Function Navigator for Current Functional Status.   Therapy/Group: Individual Therapy  Glendell Fouse 06/15/2018, 9:46 AM

## 2018-06-15 NOTE — Progress Notes (Signed)
Subjective/Complaints: Patient seen lying in bed this morning. She states she slept well overnight. She states she had a good weekend. She notes she is making daily gains with therapies and not using the walker.  ROS: denies CP, SOB, nausea, vomiting, diarrhea.  Objective: Vital Signs: Blood pressure 138/70, pulse 79, temperature 98 F (36.7 C), temperature source Oral, resp. rate 17, height _0  (1.651 m), weight 98.2 kg (216 lb 7.9 oz), SpO2 97 %. No results found. Results for orders placed or performed during the hospital encounter of 05/22/18 (from the past 72 hour(s))  Glucose, capillary     Status: Abnormal   Collection Time: 06/12/18 11:26 AM  Result Value Ref Range   Glucose-Capillary 263 (H) 70 - 99 mg/dL  Glucose, capillary     Status: Abnormal   Collection Time: 06/12/18  4:39 PM  Result Value Ref Range   Glucose-Capillary 293 (H) 70 - 99 mg/dL  Glucose, capillary     Status: Abnormal   Collection Time: 06/12/18  9:22 PM  Result Value Ref Range   Glucose-Capillary 300 (H) 70 - 99 mg/dL  Glucose, capillary     Status: Abnormal   Collection Time: 06/13/18  6:31 AM  Result Value Ref Range   Glucose-Capillary 242 (H) 70 - 99 mg/dL  Glucose, capillary     Status: Abnormal   Collection Time: 06/13/18 11:34 AM  Result Value Ref Range   Glucose-Capillary 291 (H) 70 - 99 mg/dL  Glucose, capillary     Status: Abnormal   Collection Time: 06/13/18  5:11 PM  Result Value Ref Range   Glucose-Capillary 342 (H) 70 - 99 mg/dL  Glucose, capillary     Status: Abnormal   Collection Time: 06/13/18  9:25 PM  Result Value Ref Range   Glucose-Capillary 342 (H) 70 - 99 mg/dL  Glucose, capillary     Status: Abnormal   Collection Time: 06/14/18  6:35 AM  Result Value Ref Range   Glucose-Capillary 249 (H) 70 - 99 mg/dL  Glucose, capillary     Status: Abnormal   Collection Time: 06/14/18 11:51 AM  Result Value Ref Range   Glucose-Capillary 218 (H) 70 - 99 mg/dL  Glucose, capillary      Status: Abnormal   Collection Time: 06/14/18  4:30 PM  Result Value Ref Range   Glucose-Capillary 303 (H) 70 - 99 mg/dL  Occult blood card to lab, stool RN will collect     Status: None   Collection Time: 06/14/18  8:01 PM  Result Value Ref Range   Fecal Occult Bld NEGATIVE NEGATIVE    Comment: Performed at [facility], [address] [place], [place] [address]  Glucose, capillary     Status: Abnormal   Collection Time: 06/14/18  9:59 PM  Result Value Ref Range   Glucose-Capillary 268 (H) 70 - 99 mg/dL  Basic metabolic panel     Status: Abnormal   Collection Time: 06/15/18  3:48 AM  Result Value Ref Range   Sodium 135 135 - 145 mmol/L   Potassium 4.9 3.5 - 5.1 mmol/L   Chloride 101 98 - 111 mmol/L    Comment: Please note change in reference range.   CO2 26 22 - 32 mmol/L   Glucose, Bld 246 (H) 70 - 99 mg/dL    Comment: Please note change in reference range.   BUN 89 (H) 8 - 23 mg/dL    Comment: Please note change in reference range.   Creatinine, Ser 2.21 (H)  0.44 - 1.00 mg/dL   Calcium 9.4 8.9 - 10.3 mg/dL   GFR calc non Af Amer 21 (L) >60 mL/min   GFR calc Af Amer 24 (L) >60 mL/min    Comment: (NOTE) The eGFR has been calculated using the CKD EPI equation. This calculation has not been validated in all clinical situations. eGFR's persistently <60 mL/min signify possible Chronic Kidney Disease.    Anion gap 8 5 - 15    Comment: Performed at [facility] [address] [place], [place] [address]  Glucose, capillary     Status: Abnormal   Collection Time: 06/15/18  6:59 AM  Result Value Ref Range   Glucose-Capillary 195 (H) 70 - 99 mg/dL     Constitutional: No distress . Vital signs reviewed. HENT: Normocephalic.  Atraumatic. Eyes: EOMI. No discharge. Cardiovascular: RRR. No JVD. Respiratory: CTA Bilaterally. Normal effort. GI: BS +. Non-distended. Musc: No edema or tenderness in extremities. Musc: lower extremity edema improving Neuro: Alert Motor:   Bilateral upper extremities: 4+/5 proximal distal (unchanged) Bilateral lower extremities: Hip flexion 3+/5, knee extension 4-/5, ankle dorsiflexion 5/5  Skin: Warm and dry. Intact. Psych: Normal mood.  Normal behavior.  Assessment/Plan: 1. Functional deficits secondary to L4-5 Lumbar discitis which require 3+ hours per day of interdisciplinary therapy in a comprehensive inpatient rehab setting. Physiatrist is providing close team supervision and 24 hour management of active medical problems listed below. Physiatrist and rehab team continue to assess barriers to discharge/monitor patient progress toward functional and medical goals. FIM: Function - Bathing Bathing activity did not occur: Refused Position: Bed Body parts bathed by patient: Right arm, Left arm, Chest, Abdomen Body parts bathed by helper: Right lower leg, Left lower leg, Right upper leg, Left upper leg, Back, Front perineal area, Buttocks Assist Level: 2 helpers  Function- Upper Body Dressing/Undressing Upper body dressing/undressing activity did not occur: Refused What is the patient wearing?: Pull over shirt/dress Pull over shirt/dress - Perfomed by patient: Thread/unthread right sleeve, Thread/unthread left sleeve, Pull shirt over trunk Pull over shirt/dress - Perfomed by helper: Put head through opening Orthosis activity level: Performed by helper Assist Level: Touching or steadying assistance(Pt > 75%) Function - Lower Body Dressing/Undressing Lower body dressing/undressing activity did not occur: Refused What is the patient wearing?: Pants, Ted Hose, Non-skid slipper socks Position: Bed Pants- Performed by patient: Thread/unthread right pants leg, Thread/unthread left pants leg(using reacher) Pants- Performed by helper: Pull pants up/down Non-skid slipper socks- Performed by helper: Don/doff right sock, Don/doff left sock Shoes - Performed by helper: Don/doff right shoe, Don/doff left shoe TED Hose - Performed by  helper: Don/doff right TED hose, Don/doff left TED hose Assist for lower body dressing: 2 Helpers  Function - Toileting Toileting activity did not occur: Safety/medical concerns Toileting steps completed by patient: Adjust clothing prior to toileting, Performs perineal hygiene, Adjust clothing after toileting Toileting steps completed by helper: Performs perineal hygiene Assist level: Two helpers  Function - Air cabin crew transfer activity did not occur: Safety/medical concerns Toilet transfer assistive device: Bedside commode, Mechanical lift Mechanical lift: Stedy Assist level to toilet: Maximal assist (Pt 25 - 49%/lift and lower) Assist level from toilet: Maximal assist (Pt 25 - 49%/lift and lower) Assist level to bedside commode (at bedside): 2 helpers(stand pivot) Assist level from bedside commode (at bedside): Moderate assist (Pt 50 - 74%/lift or lower)(Stedy)  Function - Chair/bed transfer Chair/bed transfer activity did not occur: Refused Chair/bed transfer method: Stand pivot Chair/bed transfer assist level: Moderate  assist (Pt 50 - 74%/lift or lower) Chair/bed transfer assistive device: Walker, Armrests Mechanical lift: Stedy Chair/bed transfer details: Verbal cues for precautions/safety, Verbal cues for technique, Verbal cues for sequencing, Tactile cues for weight shifting, Manual facilitation for placement, Manual facilitation for weight shifting  Function - Locomotion: Wheelchair Type: Manual Wheelchair activity did not occur: Refused Max wheelchair distance: 150 Assist Level: Supervision or verbal cues Wheel 50 feet with 2 turns activity did not occur: Refused Assist Level: Supervision or verbal cues Wheel 150 feet activity did not occur: Refused Assist Level: Supervision or verbal cues Function - Locomotion: Ambulation Ambulation activity did not occur: Safety/medical concerns Assistive device: Walker-rolling Max distance: 22 ft Assist level: 2  helpers Walk 10 feet activity did not occur: Safety/medical concerns Assist level: 2 helpers Walk 50 feet with 2 turns activity did not occur: Safety/medical concerns Walk 150 feet activity did not occur: Safety/medical concerns Walk 10 feet on uneven surfaces activity did not occur: Safety/medical concerns  Function - Comprehension Comprehension: Auditory Comprehension assist level: Follows complex conversation/direction with extra time/assistive device  Function - Expression Expression: Verbal Expression assist level: Expresses complex ideas: With extra time/assistive device  Function - Social Interaction Social Interaction assist level: Interacts appropriately with others with medication or extra time (anti-anxiety, antidepressant).  Function - Problem Solving Problem solving assist level: Solves complex 90% of the time/cues < 10% of the time  Function - Memory Memory assist level: Recognizes or recalls 90% of the time/requires cueing < 10% of the time Patient normally able to recall (first 3 days only): Current season, Location of own room, Staff names and faces, That he or she is in a hospital  Medical Problem List and Plan:  1. Functional deficits secondary to L4-5 osteomyelitis/discitis, lumbar spondylosis   Continue CIR  Xrays stable 2. DVT Prophylaxis/Anticoagulation: Mechanical: Sequential compression devices, below knee Bilateral lower extremities   Doppler negative for DVT in LE  LUE doppler ordered to evaluate for DVT, negative 3. Pain Management: Scheduled oxycodone prior to therapy to help with activity tolerance, decreased to 5 mg on 6/27.   Added ultram prn for moderate pain. Monitor and titrate medications to help with neuropathic symptoms. Decadron d/ced.   RLE radicular pain, partially improved on gabapentin to 616m BID, decreased to 300 mg twice a day, DC'd on 6/27 due to lethargy and tremors. Gabapentin 100 3 times a day started on/3   Low dose dexamethasone  DC'd on 6/26  Baclofen changed to 5 mg daily at bedtime, patient unable to tolerate higher doses, DC'd on 6/27 due to lethargy  Lidoderm patch added on 7/2  Tizanidine 2 mg daily started on 7/11, will attempt to minimize medications as much as possible due to sensitivity and sedation  Pain controlled on 7/15 4. Mood: LCSW to follow for evaluation and support.  5. Neuropsych: This patient is capable of making decisions on her own behalf.  6. Skin/Wound Care: routine pressure relief measures  7. Fluids/Electrolytes/Nutrition: Monitor I/O.   -Recheck labs tomorrow 8. Lumbar diskitis: Afebrile on scheduled tylenol. IV Abx per ID.  9. Acute on chronic anemia: Treated with iron and procrit on outpatient basis. Baseline Hgb- 9-10 range? Will continue to monitor serially   Hemoglobin 8.1 on 7/12  Hemoccult reordered, Negative on 7/11  Continue to monitor 10. Symptomatic Bradycardia s/p PPM: Monitor HR bid.   Resolved 11. GIB with thrombocytopenia: Resolved  Cont to monitor 12. New diagnosis T2DM: Hgb A1c- 6.3. Poorly controlled due to recent rounds of  steroids--Lantus was titrated upwards for better control. Was not on any medications at home. Monitor BS ac/hs--watch for hypoglycemic episodes as steroids tapered off.  CBG (last 3)  Recent Labs    06/14/18 1630 06/14/18 2159 06/15/18 0659  GLUCAP 303* 268* 195*   CBGs elevated on 7/15, continue SSI for now 13. Neuropathy BLE: Continue Gabapentin bid--monitor for SE.  14. Hyponatremia:   Sodium 135 on 7/15  Added protein supplements with meals. monitor for now.   Continue to monitor 15. Stage IV CKD: Baseline SCr 3.5-4.0. Monitor for SE/asterixis with narcotics.   Creatinine 2.21 on 7/15  Encourage fluids, IVF DC'd on 6/30  Continue to monitor 16. CHF  Monitor daily weights and for signs of overload.   Lasix increased to 80 mg daily on 6/27 (PTCA 80 twice a day), DC'd Filed Weights   06/13/18 0300 06/14/18 0538 06/15/18 0357  Weight:  98 kg (216 lb 0.8 oz) 97.2 kg (214 lb 4.6 oz) 98.2 kg (216 lb 7.9 oz)   Stable on 10/15 17. Morbid obesity  Encourage weight loss 18. Essential hypertension  Continue Norvasc 10 mg daily  Hydralazine 50 mg 3 times a day, increased to 75 on 6/24  Overall controlled on 7/15 19. Hypoalbuminemia  Supplement initiated 20. Hypokalemia, now with hyperkalemia  Potassium 4.9 on 7/15  Continue to monitor 21. AMS: Resolved  Likely medication induced  CXR reviewed, ECG reviewed, head CT reviewed, EEG performed - ? Borderline QTC prolongation  Discussed with pharmacy  TSH, free T4 within acceptable range  Free T3 low  Continues to have low grade fever  UA unremarkable.  Urine culture no growth  Blood cultures no growth 22. Hypothyroidism  Continue meds   See #21 23.  Sleep disturbance  Melatonin initiated on 6/30, increased on 7/1  Improving 24. 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 Leukocytosis: Resolved   WBCs 10.2 on 7/12  Appreciate ID recs  LOS (Days) 24 A FACE TO FACE EVALUATION WAS PERFORMED  Analy Bassford [name] 06/15/2018, 8:47 AM

## 2018-06-16 ENCOUNTER — Inpatient Hospital Stay (HOSPITAL_COMMUNITY): Payer: Medicare Other | Admitting: Occupational Therapy

## 2018-06-16 ENCOUNTER — Ambulatory Visit (HOSPITAL_COMMUNITY): Payer: Medicare Other | Admitting: Physical Therapy

## 2018-06-16 DIAGNOSIS — R609 Edema, unspecified: Secondary | ICD-10-CM | POA: Insufficient documentation

## 2018-06-16 LAB — FUNGUS CULTURE WITH STAIN

## 2018-06-16 LAB — GLUCOSE, CAPILLARY
GLUCOSE-CAPILLARY: 185 mg/dL — AB (ref 70–99)
GLUCOSE-CAPILLARY: 167 mg/dL — AB (ref 70–99)
GLUCOSE-CAPILLARY: 324 mg/dL — AB (ref 70–99)
Glucose-Capillary: 288 mg/dL — ABNORMAL HIGH (ref 70–99)

## 2018-06-16 LAB — FUNGUS CULTURE RESULT

## 2018-06-16 LAB — FUNGAL ORGANISM REFLEX

## 2018-06-16 MED ORDER — ACETAMINOPHEN 325 MG PO TABS
325.0000 mg | ORAL_TABLET | Freq: Four times a day (QID) | ORAL | Status: DC | PRN
  Administered 2018-06-17: 650 mg via ORAL
  Filled 2018-06-16: qty 2

## 2018-06-16 NOTE — Progress Notes (Signed)
Subjective/Complaints: Patient seen lying in bed this morning. She states she slept fairly overnight due to frequent urination. She notes improvement in edema.  ROS: denies CP, SOB, nausea, vomiting, diarrhea.  Objective: Vital Signs: Blood pressure (!) 159/85, pulse 78, temperature 98.2 F (36.8 C), temperature source Oral, resp. rate 19, height 5' 5"  (1.651 m), weight 99.7 kg (219 lb 12.8 oz), SpO2 96 %. Dg Chest 2 View  Result Date: 06/15/2018 CLINICAL DATA:  Fluid retention. EXAM: CHEST - 2 VIEW COMPARISON:  Chest radiograph June 10, 2018 FINDINGS: Cardiac silhouette is upper limits of normal size. Pulmonary vascular congestion. Calcified aortic arch. Small LEFT pleural effusion. LEFT lung base strandy densities, improved aeration of the lung bases. Dual lead LEFT cardiac pacemaker in situ. Tunneled dual lumen catheter via RIGHT subclavian venous approach with distal tip projecting in proximal RIGHT atrium, unchanged. Catheter fragment projecting RIGHT upper chest wall. Osteopenia. Accentuated kyphosis. IMPRESSION: Small residual LEFT pleural effusion with LEFT lung base atelectasis, less likely consolidation. Improved aeration. Borderline cardiomegaly.  Pulmonary vascular congestion. Aortic Atherosclerosis (ICD10-I70.0). Electronically Signed   By: Elon Alas M.D.   On: 06/15/2018 20:10   Results for orders placed or performed during the hospital encounter of 05/22/18 (from the past 72 hour(s))  Glucose, capillary     Status: Abnormal   Collection Time: 06/13/18 11:34 AM  Result Value Ref Range   Glucose-Capillary 291 (H) 70 - 99 mg/dL  Glucose, capillary     Status: Abnormal   Collection Time: 06/13/18  5:11 PM  Result Value Ref Range   Glucose-Capillary 342 (H) 70 - 99 mg/dL  Glucose, capillary     Status: Abnormal   Collection Time: 06/13/18  9:25 PM  Result Value Ref Range   Glucose-Capillary 342 (H) 70 - 99 mg/dL  Glucose, capillary     Status: Abnormal   Collection  Time: 06/14/18  6:35 AM  Result Value Ref Range   Glucose-Capillary 249 (H) 70 - 99 mg/dL  Glucose, capillary     Status: Abnormal   Collection Time: 06/14/18 11:51 AM  Result Value Ref Range   Glucose-Capillary 218 (H) 70 - 99 mg/dL  Glucose, capillary     Status: Abnormal   Collection Time: 06/14/18  4:30 PM  Result Value Ref Range   Glucose-Capillary 303 (H) 70 - 99 mg/dL  Occult blood card to lab, stool RN will collect     Status: None   Collection Time: 06/14/18  8:01 PM  Result Value Ref Range   Fecal Occult Bld NEGATIVE NEGATIVE    Comment: Performed at [facility], [address], [place], [place] [address]  Glucose, capillary     Status: Abnormal   Collection Time: 06/14/18  9:59 PM  Result Value Ref Range   Glucose-Capillary 268 (H) 70 - 99 mg/dL  Basic metabolic panel     Status: Abnormal   Collection Time: 06/15/18  3:48 AM  Result Value Ref Range   Sodium 135 135 - 145 mmol/L   Potassium 4.9 3.5 - 5.1 mmol/L   Chloride 101 98 - 111 mmol/L    Comment: Please note change in reference range.   CO2 26 22 - 32 mmol/L   Glucose, Bld 246 (H) 70 - 99 mg/dL    Comment: Please note change in reference range.   BUN 89 (H) 8 - 23 mg/dL    Comment: Please note change in reference range.   Creatinine, Ser 2.21 (H) 0.44 - 1.00 mg/dL  Calcium 9.4 8.9 - 10.3 mg/dL   GFR calc non Af Amer 21 (L) >60 mL/min   GFR calc Af Amer 24 (L) >60 mL/min    Comment: (NOTE) The eGFR has been calculated using the CKD EPI equation. This calculation has not been validated in all clinical situations. eGFR's persistently <60 mL/min signify possible Chronic Kidney Disease.    Anion gap 8 5 - 15    Comment: Performed at [facility] [address], [place], [place] [address]  Glucose, capillary     Status: Abnormal   Collection Time: 06/15/18  6:59 AM  Result Value Ref Range   Glucose-Capillary 195 (H) 70 - 99 mg/dL  Glucose, capillary     Status: Abnormal   Collection Time:  06/15/18 11:57 AM  Result Value Ref Range   Glucose-Capillary 215 (H) 70 - 99 mg/dL  Glucose, capillary     Status: Abnormal   Collection Time: 06/15/18  4:40 PM  Result Value Ref Range   Glucose-Capillary 305 (H) 70 - 99 mg/dL  Glucose, capillary     Status: Abnormal   Collection Time: 06/15/18  9:46 PM  Result Value Ref Range   Glucose-Capillary 297 (H) 70 - 99 mg/dL  Glucose, capillary     Status: Abnormal   Collection Time: 06/16/18  6:12 AM  Result Value Ref Range   Glucose-Capillary 185 (H) 70 - 99 mg/dL     Constitutional: No distress . Vital signs reviewed. HENT: Normocephalic.  Atraumatic. Eyes: EOMI. No discharge. Cardiovascular: RRR. No JVD. Respiratory: CTA bilaterally. Normal effort. GI: BS +. Non-distended. Musc: No edema or tenderness in extremities. Musc: generalized edema. No tenderness. Neuro: Alert Motor:  Bilateral upper extremities: 4+/5 proximal distal (stable) Bilateral lower extremities: Hip flexion 3+/5, knee extension 4-/5, ankle dorsiflexion 5/5 (slowly improving) Skin: Warm and dry. Intact. Psych: Normal mood.  Normal behavior.  Assessment/Plan: 1. Functional deficits secondary to L4-5 Lumbar discitis which require 3+ hours per day of interdisciplinary therapy in a comprehensive inpatient rehab setting. Physiatrist is providing close team supervision and 24 hour management of active medical problems listed below. Physiatrist and rehab team continue to assess barriers to discharge/monitor patient progress toward functional and medical goals. FIM: Function - Bathing Bathing activity did not occur: Refused Position: Bed Body parts bathed by patient: Right arm, Left arm, Chest, Abdomen Body parts bathed by helper: Right lower leg, Left lower leg, Right upper leg, Left upper leg, Back, Front perineal area, Buttocks Assist Level: 2 helpers  Function- Upper Body Dressing/Undressing Upper body dressing/undressing activity did not occur: Refused What  is the patient wearing?: Pull over shirt/dress Pull over shirt/dress - Perfomed by patient: Thread/unthread right sleeve, Thread/unthread left sleeve, Pull shirt over trunk Pull over shirt/dress - Perfomed by helper: Put head through opening Orthosis activity level: Performed by helper Assist Level: Touching or steadying assistance(Pt > 75%) Function - Lower Body Dressing/Undressing Lower body dressing/undressing activity did not occur: Refused What is the patient wearing?: Pants, Ted Hose, Non-skid slipper socks Position: Bed Pants- Performed by patient: Thread/unthread right pants leg, Thread/unthread left pants leg, Pull pants up/down Pants- Performed by helper: Pull pants up/down Non-skid slipper socks- Performed by helper: Don/doff right sock, Don/doff left sock Shoes - Performed by helper: Don/doff right shoe, Don/doff left shoe TED Hose - Performed by helper: Don/doff right TED hose, Don/doff left TED hose Assist for lower body dressing: 2 Helpers  Function - Toileting Toileting activity did not occur: Safety/medical concerns Toileting steps completed by  patient: Performs perineal hygiene Toileting steps completed by helper: Adjust clothing prior to toileting, Adjust clothing after toileting Toileting Assistive Devices: Grab bar or rail Assist level: Touching or steadying assistance (Pt.75%)  Function - Air cabin crew transfer activity did not occur: Safety/medical concerns Toilet transfer assistive device: Elevated toilet seat/BSC over toilet, Walker Mechanical lift: Stedy Assist level to toilet: Maximal assist (Pt 25 - 49%/lift and lower) Assist level from toilet: Maximal assist (Pt 25 - 49%/lift and lower) Assist level to bedside commode (at bedside): Moderate assist (Pt 50 - 74%/lift or lower) Assist level from bedside commode (at bedside): Moderate assist (Pt 50 - 74%/lift or lower)  Function - Chair/bed transfer Chair/bed transfer activity did not occur:  Refused Chair/bed transfer method: Stand pivot Chair/bed transfer assist level: Touching or steadying assistance (Pt > 75%) Chair/bed transfer assistive device: Walker, Armrests Mechanical lift: Stedy Chair/bed transfer details: Manual facilitation for weight shifting, Verbal cues for technique  Function - Locomotion: Wheelchair Type: Manual Wheelchair activity did not occur: Refused Max wheelchair distance: 150 Assist Level: Supervision or verbal cues Wheel 50 feet with 2 turns activity did not occur: Refused Assist Level: Supervision or verbal cues Wheel 150 feet activity did not occur: Refused Assist Level: Supervision or verbal cues Turns around,maneuvers to table,bed, and toilet,negotiates 3% grade,maneuvers on rugs and over doorsills: No Function - Locomotion: Ambulation Ambulation activity did not occur: Safety/medical concerns Assistive device: Walker-rolling Max distance: 22 ft Assist level: 2 helpers Walk 10 feet activity did not occur: Safety/medical concerns Assist level: 2 helpers Walk 50 feet with 2 turns activity did not occur: Safety/medical concerns Walk 150 feet activity did not occur: Safety/medical concerns Walk 10 feet on uneven surfaces activity did not occur: Safety/medical concerns  Function - Comprehension Comprehension: Auditory Comprehension assist level: Follows complex conversation/direction with extra time/assistive device  Function - Expression Expression: Verbal Expression assist level: Expresses complex ideas: With extra time/assistive device  Function - Social Interaction Social Interaction assist level: Interacts appropriately with others with medication or extra time (anti-anxiety, antidepressant).  Function - Problem Solving Problem solving assist level: Solves complex 90% of the time/cues < 10% of the time  Function - Memory Memory assist level: Recognizes or recalls 75 - 89% of the time/requires cueing 10 - 24% of the time Patient  normally able to recall (first 3 days only): Current season, Location of own room, Staff names and faces, That he or she is in a hospital  Medical Problem List and Plan:  1. Functional deficits secondary to L4-5 osteomyelitis/discitis, lumbar spondylosis   Continue CIR  Xrays stable 2. DVT Prophylaxis/Anticoagulation: Mechanical: Sequential compression devices, below knee Bilateral lower extremities   Doppler negative for DVT in LE  LUE doppler ordered to evaluate for DVT, negative 3. Pain Management: Scheduled oxycodone prior to therapy to help with activity tolerance, decreased to 5 mg on 6/27.   Added ultram prn for moderate pain. Monitor and titrate medications to help with neuropathic symptoms. Decadron d/ced.   RLE radicular pain, partially improved on gabapentin to 624m BID, decreased to 300 mg twice a day, DC'd on 6/27 due to lethargy and tremors. Gabapentin 100 3 times a day started on/3   Low dose dexamethasone DC'd on 6/26  Baclofen changed to 5 mg daily at bedtime, patient unable to tolerate higher doses, DC'd on 6/27 due to lethargy  Lidoderm patch added on 7/2  Tizanidine 2 mg daily started on 7/11, will attempt to minimize medications as much as possible due to  sensitivity and sedation  Pain controlled on 7/16 4. Mood: LCSW to follow for evaluation and support.  5. Neuropsych: This patient is capable of making decisions on her own behalf.  6. Skin/Wound Care: routine pressure relief measures  7. Fluids/Electrolytes/Nutrition: Monitor I/O.  8. Lumbar diskitis: Afebrile on scheduled tylenol. IV Abx per ID.  9. Acute on chronic anemia: Treated with iron and procrit on outpatient basis. Baseline Hgb- 9-10 range? Will continue to monitor serially   Hemoglobin 8.1 on 7/12  Hemoccult reordered, Negative on 7/11  Continue to monitor 10. Symptomatic Bradycardia s/p PPM: Monitor HR bid.   Resolved 11. GIB with thrombocytopenia: Resolved  Cont to monitor 12. New diagnosis T2DM: Hgb  A1c- 6.3. Poorly controlled due to recent rounds of steroids--Lantus was titrated upwards for better control. Was not on any medications at home. Monitor BS ac/hs--watch for hypoglycemic episodes as steroids tapered off.  CBG (last 3)  Recent Labs    06/15/18 1640 06/15/18 2146 06/16/18 0612  GLUCAP 305* 297* 185*   CBGs continues to be elevated, but improving with steroid taper, continue SSI for now 13. Neuropathy BLE: Continue Gabapentin bid--monitor for SE.  14. Hyponatremia:   Sodium 135 on 7/15  Added protein supplements with meals. monitor for now.   Continue to monitor 15. Stage IV CKD: Baseline SCr 3.5-4.0. Monitor for SE/asterixis with narcotics.   Creatinine 2.21 on 7/15  Encourage fluids, IVF [place] on 6/30  Labs ordered for tomorrow  Continue to monitor 16. CHF  Monitor daily weights and for signs of overload.   Lasix increased to 80 mg daily on 6/27 (PTCA 80 twice a day), DC'd, restarted 40 on 7/15 Filed Weights   06/14/18 0538 06/15/18 0357 06/16/18 0615  Weight: 97.2 kg (214 lb 4.6 oz) 98.2 kg (216 lb 7.9 oz) 99.7 kg (219 lb 12.8 oz)   Stable on 10/15 17. Morbid obesity  Encourage weight loss 18. Essential hypertension  Continue Norvasc 10 mg daily  Hydralazine 50 mg 3 times a day, increased to 75 on 6/24  Labile on 7/16 19. Hypoalbuminemia  Supplement initiated 20. Hypokalemia, now with hyperkalemia  Potassium 4.9 on 7/15  Continue to monitor 21. AMS: Resolved  Likely medication induced  CXR reviewed, ECG reviewed, head CT reviewed, EEG performed - ? Borderline QTC prolongation  Discussed with pharmacy  TSH, free T4 within acceptable range  Free T3 low  Continues to have low grade fever  UA unremarkable.  Urine culture no growth  Blood cultures no growth 22. Hypothyroidism  Continue meds   See #21 23.  Sleep disturbance  Melatonin initiated on 6/30, increased on 7/1  Improving 24. 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 Leukocytosis: Resolved   WBCs 10.2 on 7/12  Appreciate ID recs  LOS (Days) 25 A FACE TO FACE EVALUATION WAS PERFORMED  Louvina Cleary [name] 06/16/2018, 8:30 AM

## 2018-06-16 NOTE — Progress Notes (Signed)
Occupational Therapy Session Note  Patient Details  Name: Erika Fry MRN: [id number] Date of Birth: May 01, 1946  Today's Date: 06/16/2018 OT Individual Time: 1030-1130 and 1500-1600 OT Individual Time Calculation (min): 60 min and 60 min   Short Term Goals: Week 3:  OT Short Term Goal 1 (Week 3): STGs = LTGs (LTGs downgraded 06/10/18)  Skilled Therapeutic Interventions/Progress Updates:    1) Treatment session with focus on ADL retraining with LB dressing and grooming tasks.  Pt received supine in bed verbalizing concerns re swelling of all extremities.  Pt completed LB dressing with setup assist to obtain items and min guard when standing from elevated EOB and then pulling pants over hips alternating UE support on RW.  Min assist stand pivot transfer bed > w/c with RW.  Pt demonstrating improved hand placement with mobility and improved sit <> stand throughout session.  Completed sit > stand x3 from w/c with improved weight shift.  Pt completed grooming tasks in sitting at sink with improved weight shift to obtain items from sink.  Left seated upright in w/c with all needs in reach.  2) Treatment session with focus on hands on education with pt's daughter in Sports coach, Museum/gallery conservator.  Educated on toilet and shower transfers.  Pt completed stand pivot transfer w/c <> BSC with RW with Amber able to demonstrate appropriate assistance while still allowing pt to initiate movement.  Completed walk-in shower transfer with pt stepping backwards over simulated shower ledge with min assist from Amber for stability when stepping over ledge.  Discussed recommendation for tub bench in shower and positioning to increase safety and ease of transfers.  Demonstrated AE to increase independence with LB dressing with pt able to demonstrate use of shoe funnel and sock aid with setup assistance.  Amber asking appropriate questions and therapist answering to pt and daughter in [place] satisfaction.  Recommend pt completing first  shower with HHOT as have been unable to complete actual shower due to PICC line, both understanding.   Therapy Documentation Precautions:  Precautions Precautions: Fall Precaution Comments: Pacemaker placement 4 weeks ago Required Braces or Orthoses: Spinal Brace Spinal Brace: Lumbar corset Restrictions Weight Bearing Restrictions: No Pain:  Pt with no c/o pain  See Function Navigator for Current Functional Status.   Therapy/Group: Individual Therapy  Simonne Come 06/16/2018, 3:07 PM

## 2018-06-16 NOTE — Progress Notes (Signed)
Physical Therapy Session Note  Patient Details  Name: Erika Fry MRN: [id number] Date of Birth: 1946/06/25  Today's Date: 06/16/2018 PT Individual Time: 1301-1400 PT Individual Time Calculation (min): 59 min   Short Term Goals: Week 4:  PT Short Term Goal 1 (Week 4): STG =LTG due to ELOS    Skilled Therapeutic Interventions/Progress Updates: Pt presented in w/c with dgt in Estate manager/land agent present agreeable to therapy. Pt please with progress made in past week. Pt expressed need for urinary void, discussed with briefly with dgt as decision made that due to size of bathroom pt would initially be safest using BSC outside of bathroom. Pt performed sit to stand from w/c with minA and performed stand pivot transfer to [facility] (+void). PTA provided edu on safety and positioning while assisting for sit to stand transfer. Pt performed sit to stand with minA x 2 to allow dgt in law to assist. Pt returned to w/c min guard. PTA provided edu to dgt-in-law for w/c management with DIL able to demonstrate donning doffing ELR. Pt propelled to rehab gym supervision with increased time and no rest breaks. Pt performed stand pivot to mat x 4 w/c to/from mat with DIL and PTA providing verbal cues for safe body mechanics with pt. Pt provided rest breaks between each transfer. PTA and family discussed setting up time Tues with DIL present with family vehicle to perform car transfer.  Once pt returned to w/c DIL donned ELR and transported pt back to room. Pt agreeable to remain in w/c until next session with call bell within reach and needs met.      Therapy Documentation Precautions:  Precautions Precautions: Fall Precaution Comments: Pacemaker placement 4 weeks ago Required Braces or Orthoses: Spinal Brace Spinal Brace: Lumbar corset Restrictions Weight Bearing Restrictions: No General:   Vital Signs: Therapy Vitals Temp: 97.7 F (36.5 C) Temp Source: Oral Pulse Rate: 79 Resp: 17 BP: (!) 152/77 Patient  Position (if appropriate): Sitting Oxygen Therapy SpO2: 98 % O2 Device: Room Air  See Function Navigator for Current Functional Status.   Therapy/Group: Individual Therapy  Lewis Grivas  Armani Gawlik, PTA  06/16/2018, 4:10 PM

## 2018-06-17 ENCOUNTER — Inpatient Hospital Stay (HOSPITAL_COMMUNITY): Payer: Medicare Other | Admitting: Physical Therapy

## 2018-06-17 ENCOUNTER — Inpatient Hospital Stay (HOSPITAL_COMMUNITY): Payer: Medicare Other | Admitting: *Deleted

## 2018-06-17 ENCOUNTER — Inpatient Hospital Stay (HOSPITAL_COMMUNITY): Payer: Medicare Other | Admitting: Occupational Therapy

## 2018-06-17 DIAGNOSIS — E8770 Fluid overload, unspecified: Secondary | ICD-10-CM

## 2018-06-17 LAB — GLUCOSE, CAPILLARY
GLUCOSE-CAPILLARY: 215 mg/dL — AB (ref 70–99)
GLUCOSE-CAPILLARY: 253 mg/dL — AB (ref 70–99)
Glucose-Capillary: 161 mg/dL — ABNORMAL HIGH (ref 70–99)
Glucose-Capillary: 170 mg/dL — ABNORMAL HIGH (ref 70–99)

## 2018-06-17 LAB — BASIC METABOLIC PANEL
ANION GAP: 9 (ref 5–15)
BUN: 82 mg/dL — ABNORMAL HIGH (ref 8–23)
CALCIUM: 9 mg/dL (ref 8.9–10.3)
CO2: 25 mmol/L (ref 22–32)
Chloride: 99 mmol/L (ref 98–111)
Creatinine, Ser: 2.19 mg/dL — ABNORMAL HIGH (ref 0.44–1.00)
GFR calc Af Amer: 24 mL/min — ABNORMAL LOW (ref >60)
GFR calc non Af Amer: 21 mL/min — ABNORMAL LOW (ref >60)
GLUCOSE: 195 mg/dL — AB (ref 70–99)
Potassium: 4.8 mmol/L (ref 3.5–5.1)
Sodium: 133 mmol/L — ABNORMAL LOW (ref 135–145)

## 2018-06-17 MED ORDER — FUROSEMIDE 10 MG/ML IJ SOLN
40.0000 mg | Freq: Once | INTRAMUSCULAR | Status: AC
  Administered 2018-06-17: 40 mg via INTRAVENOUS
  Filled 2018-06-17: qty 4

## 2018-06-17 MED ORDER — FUROSEMIDE 40 MG PO TABS
80.0000 mg | ORAL_TABLET | Freq: Two times a day (BID) | ORAL | Status: DC
  Administered 2018-06-17 – 2018-06-18 (×2): 80 mg via ORAL
  Filled 2018-06-17 (×2): qty 2

## 2018-06-17 MED ORDER — DARBEPOETIN ALFA 100 MCG/0.5ML IJ SOSY: 100.0000 ug | PREFILLED_SYRINGE | INTRAMUSCULAR | Status: DC

## 2018-06-17 MED ORDER — FUROSEMIDE 40 MG PO TABS: 80.0000 mg | ORAL_TABLET | Freq: Every day | ORAL | Status: DC

## 2018-06-17 MED ORDER — GLIMEPIRIDE 2 MG PO TABS
1.0000 mg | ORAL_TABLET | Freq: Every day | ORAL | Status: DC
  Administered 2018-06-17 – 2018-06-19 (×3): 1 mg via ORAL
  Filled 2018-06-17 (×3): qty 1

## 2018-06-17 NOTE — Progress Notes (Signed)
Occupational Therapy Session Note  Patient Details  Name: Erika Fry MRN: [id number] Date of Birth: [birth year]  Today's Date: 06/17/2018 OT Individual Time: 1303-1400 OT Individual Time Calculation (min): 57 min    Short Term Goals: Week 3:  OT Short Term Goal 1 (Week 3): STGs = LTGs (LTGs downgraded 06/10/18)  Skilled Therapeutic Interventions/Progress Updates:    Treatment session with focus on dynamic standing balance and use of AE for LB dressing.  Pt reports fatigue this session and concerns about having PT session immediately following OT session.  Engaged in sit > stand with mod assist due to pain in Rt hip and increased fluid in BLE.  Engaged in reaching activity in standing to simulate LB dressing and hygiene as pt reports still having difficulty completing hygiene post toileting.  Pt able to complete sit <> stand x4 with min-mod assist.  Pt completed LB dressing with donning and doffing socks and shoes this session utilizing AE, pt continues to require increased time.  Pt continues to require frequent rest breaks during activity, requiring encouragement for participation.  Heat applied to Rt hip for pain relief as pt reports increased pain in Rt hip, requiring increased assist with sit > stand this session due to pain in Rt hip.  Therapy Documentation Precautions:  Precautions Precautions: Fall Precaution Comments: Pacemaker placement 4 weeks ago Required Braces or Orthoses: Spinal Brace Spinal Brace: Lumbar corset Restrictions Weight Bearing Restrictions: No General:   Vital Signs: Therapy Vitals Temp: 97.9 F (36.6 C) Temp Source: Oral Pulse Rate: 76 Resp: 17 BP: 131/67 Patient Position (if appropriate): Sitting Oxygen Therapy SpO2: 98 % O2 Device: Room Air Pain: Pain Assessment Pain Scale: 0-10 Pain Score: 9  Pain Type: Acute pain Pain Location: Hip Pain Orientation: Right Pain Descriptors / Indicators: Aching;Constant Pain Frequency: Constant Pain  Onset: On-going Patients Stated Pain Goal: 2 Pain Intervention(s): Medication (See eMAR)  See Function Navigator for Current Functional Status.   Therapy/Group: Individual Therapy  Simonne Come 06/17/2018, 2:57 PM

## 2018-06-17 NOTE — Progress Notes (Signed)
Subjective/Complaints: Patient in good spirits this morning.  Denies any problems with breathing or shortness of breath.  Swelling has increased somewhat in lower extremities.  ROS: Patient denies fever, rash, sore throat, blurred vision, nausea, vomiting, diarrhea, cough, shortness of breath or chest pain, joint or back pain, headache, or mood change.    Objective: Vital Signs: Blood pressure 136/62, pulse 71, temperature 97.8 F (36.6 C), temperature source Oral, resp. rate 18, height 5' 5"  (1.651 m), weight 100 kg (220 lb 7.4 oz), SpO2 96 %. Dg Chest 2 View  Result Date: 06/15/2018 CLINICAL DATA:  Fluid retention. EXAM: CHEST - 2 VIEW COMPARISON:  Chest radiograph June 10, 2018 FINDINGS: Cardiac silhouette is upper limits of normal size. Pulmonary vascular congestion. Calcified aortic arch. Small LEFT pleural effusion. LEFT lung base strandy densities, improved aeration of the lung bases. Dual lead LEFT cardiac pacemaker in situ. Tunneled dual lumen catheter via RIGHT subclavian venous approach with distal tip projecting in proximal RIGHT atrium, unchanged. Catheter fragment projecting RIGHT upper chest wall. Osteopenia. Accentuated kyphosis. IMPRESSION: Small residual LEFT pleural effusion with LEFT lung base atelectasis, less likely consolidation. Improved aeration. Borderline cardiomegaly.  Pulmonary vascular congestion. Aortic Atherosclerosis (ICD10-I70.0). Electronically Signed   By: Elon Alas M.D.   On: 06/15/2018 20:10   Results for orders placed or performed during the hospital encounter of 05/22/18 (from the past 72 hour(s))  Glucose, capillary     Status: Abnormal   Collection Time: 06/14/18 11:51 AM  Result Value Ref Range   Glucose-Capillary 218 (H) 70 - 99 mg/dL  Glucose, capillary     Status: Abnormal   Collection Time: 06/14/18  4:30 PM  Result Value Ref Range   Glucose-Capillary 303 (H) 70 - 99 mg/dL  Occult blood card to lab, stool RN will collect     Status: None    Collection Time: 06/14/18  8:01 PM  Result Value Ref Range   Fecal Occult Bld NEGATIVE NEGATIVE    Comment: Performed at [facility], [address], [place], [place] [address]  Glucose, capillary     Status: Abnormal   Collection Time: 06/14/18  9:59 PM  Result Value Ref Range   Glucose-Capillary 268 (H) 70 - 99 mg/dL  Basic metabolic panel     Status: Abnormal   Collection Time: 06/15/18  3:48 AM  Result Value Ref Range   Sodium 135 135 - 145 mmol/L   Potassium 4.9 3.5 - 5.1 mmol/L   Chloride 101 98 - 111 mmol/L    Comment: Please note change in reference range.   CO2 26 22 - 32 mmol/L   Glucose, Bld 246 (H) 70 - 99 mg/dL    Comment: Please note change in reference range.   BUN 89 (H) 8 - 23 mg/dL    Comment: Please note change in reference range.   Creatinine, Ser 2.21 (H) 0.44 - 1.00 mg/dL   Calcium 9.4 8.9 - 10.3 mg/dL   GFR calc non Af Amer 21 (L) >60 mL/min   GFR calc Af Amer 24 (L) >60 mL/min    Comment: (NOTE) The eGFR has been calculated using the CKD EPI equation. This calculation has not been validated in all clinical situations. eGFR's persistently <60 mL/min signify possible Chronic Kidney Disease.    Anion gap 8 5 - 15    Comment: Performed at [facility] [address], [place], [place] [address]  Glucose, capillary     Status: Abnormal   Collection Time:  06/15/18  6:59 AM  Result Value Ref Range   Glucose-Capillary 195 (H) 70 - 99 mg/dL  Glucose, capillary     Status: Abnormal   Collection Time: 06/15/18 11:57 AM  Result Value Ref Range   Glucose-Capillary 215 (H) 70 - 99 mg/dL  Glucose, capillary     Status: Abnormal   Collection Time: 06/15/18  4:40 PM  Result Value Ref Range   Glucose-Capillary 305 (H) 70 - 99 mg/dL  Glucose, capillary     Status: Abnormal   Collection Time: 06/15/18  9:46 PM  Result Value Ref Range   Glucose-Capillary 297 (H) 70 - 99 mg/dL  Glucose, capillary     Status: Abnormal   Collection Time: 06/16/18  6:12  AM  Result Value Ref Range   Glucose-Capillary 185 (H) 70 - 99 mg/dL  Glucose, capillary     Status: Abnormal   Collection Time: 06/16/18 11:57 AM  Result Value Ref Range   Glucose-Capillary 167 (H) 70 - 99 mg/dL  Glucose, capillary     Status: Abnormal   Collection Time: 06/16/18  4:34 PM  Result Value Ref Range   Glucose-Capillary 324 (H) 70 - 99 mg/dL   Comment 1 Notify RN   Glucose, capillary     Status: Abnormal   Collection Time: 06/16/18  8:55 PM  Result Value Ref Range   Glucose-Capillary 288 (H) 70 - 99 mg/dL  Basic metabolic panel     Status: Abnormal   Collection Time: 06/17/18  4:00 AM  Result Value Ref Range   Sodium 133 (L) 135 - 145 mmol/L   Potassium 4.8 3.5 - 5.1 mmol/L   Chloride 99 98 - 111 mmol/L    Comment: Please note change in reference range.   CO2 25 22 - 32 mmol/L   Glucose, Bld 195 (H) 70 - 99 mg/dL    Comment: Please note change in reference range.   BUN 82 (H) 8 - 23 mg/dL    Comment: Please note change in reference range.   Creatinine, Ser 2.19 (H) 0.44 - 1.00 mg/dL   Calcium 9.0 8.9 - 10.3 mg/dL   GFR calc non Af Amer 21 (L) >60 mL/min   GFR calc Af Amer 24 (L) >60 mL/min    Comment: (NOTE) The eGFR has been calculated using the CKD EPI equation. This calculation has not been validated in all clinical situations. eGFR's persistently <60 mL/min signify possible Chronic Kidney Disease.    Anion gap 9 5 - 15    Comment: Performed at [facility] [address] [place], [place] [address]  Glucose, capillary     Status: Abnormal   Collection Time: 06/17/18  6:42 AM  Result Value Ref Range   Glucose-Capillary 161 (H) 70 - 99 mg/dL     Constitutional: No distress . Vital signs reviewed. HEENT: EOMI, oral membranes moist Neck: supple Cardiovascular: RRR without murmur. No JVD    Respiratory: CTA Bilaterally without wheezes or rales. Normal effort    GI: BS +, non-tender, non-distended  Musc: 1 to 2+ le edema Neuro: Alert Motor:   Bilateral upper extremities: 4+/5 proximal distal (stable) Bilateral lower extremities: Hip flexion 3+/5, knee extension 4-/5, ankle dorsiflexion 5/5 (stable) Skin: Warm and dry. Intact. Psych: Normal mood.  Normal behavior.  Assessment/Plan: 1. Functional deficits secondary to L4-5 Lumbar discitis which require 3+ hours per day of interdisciplinary therapy in a comprehensive inpatient rehab setting. Physiatrist is providing close team supervision and 24 hour management of active  medical problems listed below. Physiatrist and rehab team continue to assess barriers to discharge/monitor patient progress toward functional and medical goals. FIM: Function - Bathing Bathing activity did not occur: Refused Position: Bed Body parts bathed by patient: Right arm, Left arm, Chest, Abdomen Body parts bathed by helper: Right lower leg, Left lower leg, Right upper leg, Left upper leg, Back, Front perineal area, Buttocks Assist Level: 2 helpers  Function- Upper Body Dressing/Undressing Upper body dressing/undressing activity did not occur: Refused What is the patient wearing?: Pull over shirt/dress Pull over shirt/dress - Perfomed by patient: Thread/unthread right sleeve, Thread/unthread left sleeve, Pull shirt over trunk Pull over shirt/dress - Perfomed by helper: Put head through opening Orthosis activity level: Performed by helper Assist Level: Touching or steadying assistance(Pt > 75%) Function - Lower Body Dressing/Undressing Lower body dressing/undressing activity did not occur: Refused What is the patient wearing?: Pants, Non-skid slipper socks, Ted Hose Position: Sitting EOB Pants- Performed by patient: Thread/unthread right pants leg, Thread/unthread left pants leg, Pull pants up/down Pants- Performed by helper: Pull pants up/down Non-skid slipper socks- Performed by patient: Don/doff right sock, Don/doff left sock Non-skid slipper socks- Performed by helper: Don/doff right sock, Don/doff  left sock Shoes - Performed by helper: Don/doff right shoe, Don/doff left shoe TED Hose - Performed by helper: Don/doff right TED hose, Don/doff left TED hose Assist for footwear: Supervision/touching assist Assist for lower body dressing: Touching or steadying assistance (Pt > 75%)  Function - Toileting Toileting activity did not occur: Safety/medical concerns Toileting steps completed by patient: Performs perineal hygiene Toileting steps completed by helper: Adjust clothing prior to toileting, Adjust clothing after toileting Toileting Assistive Devices: Grab bar or rail Assist level: Touching or steadying assistance (Pt.75%)  Function - Air cabin crew transfer activity did not occur: Safety/medical concerns Toilet transfer assistive device: Elevated toilet seat/BSC over toilet, Walker Mechanical lift: Stedy Assist level to toilet: Maximal assist (Pt 25 - 49%/lift and lower) Assist level from toilet: Maximal assist (Pt 25 - 49%/lift and lower) Assist level to bedside commode (at bedside): Moderate assist (Pt 50 - 74%/lift or lower) Assist level from bedside commode (at bedside): Moderate assist (Pt 50 - 74%/lift or lower)  Function - Chair/bed transfer Chair/bed transfer activity did not occur: Refused Chair/bed transfer method: Stand pivot Chair/bed transfer assist level: Touching or steadying assistance (Pt > 75%) Chair/bed transfer assistive device: Walker, Armrests Mechanical lift: Stedy Chair/bed transfer details: Manual facilitation for weight shifting, Verbal cues for technique  Function - Locomotion: Wheelchair Type: Manual Wheelchair activity did not occur: Refused Max wheelchair distance: 150 Assist Level: Supervision or verbal cues Wheel 50 feet with 2 turns activity did not occur: Refused Assist Level: Supervision or verbal cues Wheel 150 feet activity did not occur: Refused Assist Level: Supervision or verbal cues Turns around,maneuvers to table,bed, and  toilet,negotiates 3% grade,maneuvers on rugs and over doorsills: No Function - Locomotion: Ambulation Ambulation activity did not occur: Safety/medical concerns Assistive device: Walker-rolling Max distance: 22 ft Assist level: 2 helpers Walk 10 feet activity did not occur: Safety/medical concerns Assist level: 2 helpers Walk 50 feet with 2 turns activity did not occur: Safety/medical concerns Walk 150 feet activity did not occur: Safety/medical concerns Walk 10 feet on uneven surfaces activity did not occur: Safety/medical concerns  Function - Comprehension Comprehension: Auditory Comprehension assist level: Follows complex conversation/direction with extra time/assistive device  Function - Expression Expression: Verbal Expression assist level: Expresses complex ideas: With extra time/assistive device  Function - Social Interaction Social  Interaction assist level: Interacts appropriately with others with medication or extra time (anti-anxiety, antidepressant).  Function - Problem Solving Problem solving assist level: Solves complex 90% of the time/cues < 10% of the time  Function - Memory Memory assist level: Recognizes or recalls 75 - 89% of the time/requires cueing 10 - 24% of the time Patient normally able to recall (first 3 days only): Current season, Location of own room, Staff names and faces, That he or she is in a hospital  Medical Problem List and Plan:  1. Functional deficits secondary to L4-5 osteomyelitis/discitis, lumbar spondylosis   Continue CIR  Xrays stable 2. DVT Prophylaxis/Anticoagulation: Mechanical: Sequential compression devices, below knee Bilateral lower extremities   Doppler negative for DVT in LE  LUE doppler ordered to evaluate for DVT, negative 3. Pain Management: Scheduled oxycodone prior to therapy to help with activity tolerance, decreased to 5 mg on 6/27.   Continue Ultram prn for moderate pain. Monitor and titrate medications to help with  neuropathic symptoms. Decadron d/ced.   RLE radicular pain, partially improved on gabapentin to '600mg'$  BID, decreased to 300 mg twice a day, DC'd on 6/27 due to lethargy and tremors. Gabapentin 100 3 times a day started on/3   Low dose dexamethasone DC'd on 6/26  Baclofen changed to 5 mg daily at bedtime, patient unable to tolerate higher doses, DC'd on 6/27 due to lethargy  Lidoderm patch added on 7/2  Tizanidine 2 mg daily started on 7/11, will attempt to minimize medications as much as possible due to sensitivity and sedation  Pain controlled on 7/17 4. Mood: LCSW to follow for evaluation and support.  5. Neuropsych: This patient is capable of making decisions on her own behalf.  6. Skin/Wound Care: routine pressure relief measures  7. Fluids/Electrolytes/Nutrition: Monitor I/O.  8. Lumbar diskitis: Afebrile on scheduled tylenol. IV Abx per ID.  9. Acute on chronic anemia: Treated with iron and procrit on outpatient basis. Baseline Hgb- 9-10 range? Will continue to monitor serially   Hemoglobin 8.1 on 7/12  Hemoccult reordered, Negative on 7/11  Continue to monitor 10. Symptomatic Bradycardia s/p PPM: Monitor HR bid.   Resolved 11. GIB with thrombocytopenia: Resolved  Cont to monitor 12. New diagnosis T2DM: Hgb A1c- 6.3. Poorly controlled due to recent rounds of steroids--Lantus was titrated upwards for better control. Was not on any medications at home. Monitor BS ac/hs--watch for hypoglycemic episodes as steroids tapered off.  CBG (last 3)  Recent Labs    06/16/18 1634 06/16/18 2055 06/17/18 0642  GLUCAP 324* 288* 161*   CBGs continues to be elevated especially during mid day and evening,   -steroids at '10mg'$  daily only  -begin '1mg'$  amaryl today 13. Neuropathy BLE: Continue Gabapentin bid--monitor for SE.  14. Hyponatremia:   Sodium 135 on 7/15  Added protein supplements with meals. monitor for now.   Continue to monitor 15. Stage IV CKD: Baseline SCr 3.5-4.0. Monitor for  SE/asterixis with narcotics.   Creatinine 2.19 7/17  Encourage fluids, IVF DC'd on 6/30  Continue to monitor with increased lasix 16. CHF  Monitor daily weights and for signs of overload.   Lasix increased to 80 mg daily on 6/27 (PTCA 80 twice a day), DC'd, restarted 40 on 7/15, weights continue to trend up Filed Weights   06/15/18 0357 06/16/18 0615 06/17/18 0653  Weight: 98.2 kg (216 lb 7.9 oz) 99.7 kg (219 lb 12.8 oz) 100 kg (220 lb 7.4 oz)   Increase lasix to '80mg'$  today  -  check bmet tomorrow 17. Morbid obesity  Encourage weight loss 18. Essential hypertension  Continue Norvasc 10 mg daily  Hydralazine 50 mg 3 times a day, increased to 75 on 6/24  Fair control 19. Hypoalbuminemia  Supplement initiated 20. Hypokalemia, now with hyperkalemia  Potassium 4.9 on 7/15---> 4.8 7/17  Continue to monitor 21. AMS: Resolved  Likely medication induced  CXR reviewed, ECG reviewed, head CT reviewed, EEG performed - ? Borderline QTC prolongation  Discussed with pharmacy  TSH, free T4 within acceptable range  Free T3 low  Continues to have low grade fever  UA unremarkable.  Urine culture no growth  Blood cultures no growth 22. Hypothyroidism  Continue meds   See #21 23.  Sleep disturbance  Melatonin initiated on 6/30, increased on 7/1  Improving 24. 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 Leukocytosis: Resolved   WBCs 10.2 on 7/12  Appreciate ID recs  LOS (Days) 26 A FACE TO FACE EVALUATION WAS PERFORMED  Meredith Staggers 06/17/2018, 11:16 AM

## 2018-06-17 NOTE — Progress Notes (Addendum)
Physical Therapy Session Note  Patient Details  Name: Erika Fry MRN: [id number] Date of Birth: 06/23/1946  Today's Date: 06/17/2018 PT Individual Time: 0908-1003 and 1101-1130  PT Individual Time Calculation (min): 55 min   Short Term Goals: Week 4:  PT Short Term Goal 1 (Week 4): STG =LTG due to ELOS    Skilled Therapeutic Interventions/Progress Updates: Tx1: Pt presented in bed agreeable to therapy. Session focused on car transfer with pt's vehicle. Pt performed supine to sit with use of bed features and minA for BLE management. Pt performed sit to stand from elevated bed with minA and CGA stand pivot transfer to w/c. Pt transported to [facility] entrance and participated with dgt-in-law, Museum/gallery conservator car transfer into [employer]. Pt required modA sit to stand from w/c and took several steps to vehicle. Due to height pt unable to fully sit on seat but able to rest buttock against car seat. Pt required x1 person blocking knees, maxA scooting into seat. Pt required maxA for placing foot onto running board, once feet on running board pt able to scoot back into seat. Discussed with pt and dgt-in-law use of alternate vehicle due to unsafe manner to enter vehicle. Dgt-in-law will continue efforts to find different vehicle for d/c. Pt able to come to standing with rec therapist blocking bilateral feet and PTA blocking RW. Once in standing pt able to take several steps to w/c. Pt transported to rehab gym and performed ambulatory transfer to elevated mat. Performed sit to/from stand x 5 from 21in mat with minA for BLE strengthening. Pt returned to w/c and transported back to room and remained in w/c with call bell within reach and current needs met.   Tx2: Pt presented in w/c with dgt-in-law present agreeable to therapy. Per [employer], able to use vehicle that is 28in heigh vs 33in. Pt transported to ortho gym and performed car transfer x 2 to 28in height. Pt able to perform with minA and use of step stool. Pt  performed both with PTA and with [name]. Pt able to stand from 28in height with minA and perform ambulatory transfer to w/c. Pt transported back to room and all persons noted increased swelling in pt's BLE. Pt advised to may be best to return to bed as 2 hours therapy back to back in pm and to allow legs to be elevated. Pt adv NT who was entering room  to assist in transporting back to bed and LPN E. Lopez notified in BLE swelling.      Therapy Documentation Precautions:  Precautions Precautions: Fall Precaution Comments: Pacemaker placement 4 weeks ago Required Braces or Orthoses: Spinal Brace Spinal Brace: Lumbar corset Restrictions Weight Bearing Restrictions: No General:   Vital Signs: Therapy Vitals Temp: 97.9 F (36.6 C) Temp Source: Oral Pulse Rate: 76 Resp: 17 BP: 131/67 Patient Position (if appropriate): Sitting Oxygen Therapy SpO2: 98 % O2 Device: Room Air   See Function Navigator for Current Functional Status.   Therapy/Group: Individual Therapy  Erika Fry  Erika Fry, PTA  06/17/2018, 4:12 PM

## 2018-06-17 NOTE — Progress Notes (Signed)
Spoke with Anderson Malta [place]--daughter in law regarding her concerns relating to peripheral edema and anemia (has been on procrit--d/c in Dec).  I relayed that baseline Hgb appears to be 10 and currently she in 8-9 range.  She's concerned about upcoming d/c and reported that we will reach out to nephrology for input.   Discussed patient with Dr. Hollie Salk. She reported that patient's baseline SCr was low 3's and patient sounded to be fluid overloaded. She recommended resuming home dose lasix 80 mg bid. Also to administer Aranesp 100 mcg this Friday for anemia of chronic disease. Will follow up with Dr.Powell her primary nephrologist in am.

## 2018-06-17 NOTE — Progress Notes (Signed)
Patient refusing to use BSC. Patient states "it's just too hard for me to get up with my legs swollen like this." Educated patient on the need to move and importance of doing for herself. Will continue to monitor.

## 2018-06-18 ENCOUNTER — Inpatient Hospital Stay (HOSPITAL_COMMUNITY): Payer: Medicare Other | Admitting: Physical Therapy

## 2018-06-18 ENCOUNTER — Inpatient Hospital Stay (HOSPITAL_COMMUNITY): Payer: Medicare Other | Admitting: Occupational Therapy

## 2018-06-18 LAB — CBC WITH DIFFERENTIAL/PLATELET
ABS IMMATURE GRANULOCYTES: 0.1 10*3/uL (ref 0.0–0.1)
BASOS ABS: 0 10*3/uL (ref 0.0–0.1)
Basophils Relative: 0 %
Eosinophils Absolute: 0 10*3/uL (ref 0.0–0.7)
Eosinophils Relative: 0 %
HEMATOCRIT: 25.4 % — AB (ref 36.0–46.0)
HEMOGLOBIN: 8 g/dL — AB (ref 12.0–15.0)
IMMATURE GRANULOCYTES: 1 %
LYMPHS ABS: 0.8 10*3/uL (ref 0.7–4.0)
LYMPHS PCT: 9 %
MCH: 29.3 pg (ref 26.0–34.0)
MCHC: 31.5 g/dL (ref 30.0–36.0)
MCV: 93 fL (ref 78.0–100.0)
Monocytes Absolute: 0.5 10*3/uL (ref 0.1–1.0)
Monocytes Relative: 5 %
NEUTROS ABS: 7.7 10*3/uL (ref 1.7–7.7)
NEUTROS PCT: 85 %
Platelets: 196 10*3/uL (ref 150–400)
RBC: 2.73 MIL/uL — AB (ref 3.87–5.11)
RDW: 17.5 % — ABNORMAL HIGH (ref 11.5–15.5)
WBC: 9.1 10*3/uL (ref 4.0–10.5)

## 2018-06-18 LAB — BASIC METABOLIC PANEL
ANION GAP: 9 (ref 5–15)
BUN: 83 mg/dL — AB (ref 8–23)
CHLORIDE: 98 mmol/L (ref 98–111)
CO2: 28 mmol/L (ref 22–32)
Calcium: 9.2 mg/dL (ref 8.9–10.3)
Creatinine, Ser: 2.09 mg/dL — ABNORMAL HIGH (ref 0.44–1.00)
GFR calc Af Amer: 26 mL/min — ABNORMAL LOW (ref >60)
GFR, EST NON AFRICAN AMERICAN: 22 mL/min — AB (ref >60)
Glucose, Bld: 114 mg/dL — ABNORMAL HIGH (ref 70–99)
POTASSIUM: 4.2 mmol/L (ref 3.5–5.1)
SODIUM: 135 mmol/L (ref 135–145)

## 2018-06-18 LAB — GLUCOSE, CAPILLARY
GLUCOSE-CAPILLARY: 85 mg/dL (ref 70–99)
GLUCOSE-CAPILLARY: 85 mg/dL (ref 70–99)
GLUCOSE-CAPILLARY: 126 mg/dL — AB (ref 70–99)
GLUCOSE-CAPILLARY: 138 mg/dL — AB (ref 70–99)

## 2018-06-18 MED ORDER — FUROSEMIDE 10 MG/ML IJ SOLN
80.0000 mg | Freq: Two times a day (BID) | INTRAMUSCULAR | Status: DC
  Administered 2018-06-18 – 2018-06-19 (×3): 80 mg via INTRAVENOUS
  Filled 2018-06-18 (×3): qty 8

## 2018-06-18 NOTE — Progress Notes (Signed)
Physical Therapy Session Note  Patient Details  Name: Erika Fry MRN: [id number] Date of Birth: 1946-04-09  Today's Date: 06/18/2018 PT Individual Time: 1000-1100 AND 1400-1500 PT Individual Time Calculation (min): 60 min and 60 min   Short Term Goals: Week 4:  PT Short Term Goal 1 (Week 4): STG =LTG due to ELOS    Skilled Therapeutic Interventions/Progress Updates:  Session 1.  Pt received sitting in WC and agreeable to PT. PT treatment focused on functional mobility, including WC mobility, Gait training, and transfer training. See below for details. Pt returned to room and performed stand pivot transfer to bed with min assist. Sit>supine completed with mod assist to manage BLE, and left supine in bed with call bell in reach and all needs met.    Session 2.     Pt received supine in bed and agreeable to PT. Supine>sit transfer with supervision assist and min cues for use of bed rails.   Stand pivot transfer to [facility] with CGA from elevated bed height. Blocked practice sit<>stand transfer x 5 with min assist ~3 min; pt very distracted while performing 5xSTS, and attempted to engage in conversation following each trasnfer.   Stair transfer training x 6 with min assist overall from PT. Moderate cues for gait pattern, AD management Pt's care giver instructed in step management with moderate cues for safety awareness and proper guarding technique to reduce stress and improve success of transfers and step management.   Patient returned to room and left sitting in [facility] with call bell in reach and all needs met.           .    Therapy Documentation Precautions:  Precautions Precautions: Fall Precaution Comments: Pacemaker placement 4 weeks ago Required Braces or Orthoses: Spinal Brace Spinal Brace: Lumbar corset Restrictions Weight Bearing Restrictions: No General:   Vital Signs: Therapy Vitals BP: 136/68 Pain: Pain Assessment Pain Scale: 0-10 Pain Score: 3  Pain Type:  Chronic pain Pain Location: Hip Pain Orientation: Right Pain Descriptors / Indicators: Aching;Constant Pain Frequency: Constant Pain Onset: On-going Patients Stated Pain Goal: 2 Pain Intervention(s): Medication (See eMAR) Mobility: Bed Mobility Bed Mobility: Rolling Right;Rolling Left;Sit to Supine Rolling Right: Minimal Assistance - Patient > 75% Rolling Left: Minimal Assistance - Patient > 75% Sit to Supine: Moderate Assistance - Patient 50-74% Transfers Transfers: Sit to Stand;Stand to Sit;Stand Pivot Transfers Sit to Stand: Minimal Assistance - Patient > 75% Stand to Sit: Minimal Assistance - Patient > 75% Stand Pivot Transfers: Minimal Assistance - Patient > 75% Stand Pivot Transfer Details: Verbal cues for technique;Verbal cues for precautions/safety;Verbal cues for sequencing;Manual facilitation for weight shifting Transfer (Assistive device): Rolling walker Locomotion : Gait Ambulation: Yes Gait Assistance: Minimal Assistance - Patient > 75% Gait Distance (Feet): 50 Feet Assistive device: Rolling walker Gait Gait: Yes Gait Pattern: Wide base of support;Lateral hip instability Wheelchair Mobility Wheelchair Mobility: Yes Wheelchair Assistance: Chartered loss adjuster: Both upper extremities Wheelchair Parts Management: Supervision/cueing Distance: 177f    Balance: Balance Balance Assessed: Yes Static Sitting Balance Static Sitting - Balance Support: No upper extremity supported Static Sitting - Level of Assistance: 6: Modified independent (Device/Increase time) Dynamic Sitting Balance Dynamic Sitting - Balance Support: Right upper extremity supported Dynamic Sitting - Level of Assistance: 5: Stand by assistance Static Standing Balance Static Standing - Balance Support: Bilateral upper extremity supported Static Standing - Level of Assistance: 5: Stand by assistance Dynamic Standing Balance Dynamic Standing - Balance Support: Bilateral  upper extremity supported Dynamic Standing -  Level of Assistance: 4: Min assist   See Function Navigator for Current Functional Status.   Therapy/Group: Individual Therapy  [name] 06/18/2018, 11:33 AM

## 2018-06-18 NOTE — Progress Notes (Addendum)
Subjective/Complaints: Pt emptying bladder frequently given increase in diuretics. Notices improvement in LE edema already  ROS: Patient denies fever, rash, sore throat, blurred vision, nausea, vomiting, diarrhea, cough, shortness of breath or chest pain, joint pain, headache, or mood change.   Objective: Vital Signs: Blood pressure 136/68, pulse 70, temperature 98 F (36.7 C), temperature source Oral, resp. rate 17, height _0  (1.651 m), weight 100.4 kg (221 lb 5.5 oz), SpO2 98 %. No results found. Results for orders placed or performed during the hospital encounter of 05/22/18 (from the past 72 hour(s))  Glucose, capillary     Status: Abnormal   Collection Time: 06/15/18 11:57 AM  Result Value Ref Range   Glucose-Capillary 215 (H) 70 - 99 mg/dL  Glucose, capillary     Status: Abnormal   Collection Time: 06/15/18  4:40 PM  Result Value Ref Range   Glucose-Capillary 305 (H) 70 - 99 mg/dL  Glucose, capillary     Status: Abnormal   Collection Time: 06/15/18  9:46 PM  Result Value Ref Range   Glucose-Capillary 297 (H) 70 - 99 mg/dL  Glucose, capillary     Status: Abnormal   Collection Time: 06/16/18  6:12 AM  Result Value Ref Range   Glucose-Capillary 185 (H) 70 - 99 mg/dL  Glucose, capillary     Status: Abnormal   Collection Time: 06/16/18 11:57 AM  Result Value Ref Range   Glucose-Capillary 167 (H) 70 - 99 mg/dL  Glucose, capillary     Status: Abnormal   Collection Time: 06/16/18  4:34 PM  Result Value Ref Range   Glucose-Capillary 324 (H) 70 - 99 mg/dL   Comment 1 Notify RN   Glucose, capillary     Status: Abnormal   Collection Time: 06/16/18  8:55 PM  Result Value Ref Range   Glucose-Capillary 288 (H) 70 - 99 mg/dL  Basic metabolic panel     Status: Abnormal   Collection Time: 06/17/18  4:00 AM  Result Value Ref Range   Sodium 133 (L) 135 - 145 mmol/L   Potassium 4.8 3.5 - 5.1 mmol/L   Chloride 99 98 - 111 mmol/L    Comment: Please note change in reference range.    CO2 25 22 - 32 mmol/L   Glucose, Bld 195 (H) 70 - 99 mg/dL    Comment: Please note change in reference range.   BUN 82 (H) 8 - 23 mg/dL    Comment: Please note change in reference range.   Creatinine, Ser 2.19 (H) 0.44 - 1.00 mg/dL   Calcium 9.0 8.9 - 10.3 mg/dL   GFR calc non Af Amer 21 (L) >60 mL/min   GFR calc Af Amer 24 (L) >60 mL/min    Comment: (NOTE) The eGFR has been calculated using the CKD EPI equation. This calculation has not been validated in all clinical situations. eGFR's persistently <60 mL/min signify possible Chronic Kidney Disease.    Anion gap 9 5 - 15    Comment: Performed at [facility] [address], [place], [place] [address]  Glucose, capillary     Status: Abnormal   Collection Time: 06/17/18  6:42 AM  Result Value Ref Range   Glucose-Capillary 161 (H) 70 - 99 mg/dL  Glucose, capillary     Status: Abnormal   Collection Time: 06/17/18 11:45 AM  Result Value Ref Range   Glucose-Capillary 170 (H) 70 - 99 mg/dL   Comment 1 Notify RN   Glucose, capillary     Status:  Abnormal   Collection Time: 06/17/18  4:43 PM  Result Value Ref Range   Glucose-Capillary 215 (H) 70 - 99 mg/dL   Comment 1 Notify RN   Glucose, capillary     Status: Abnormal   Collection Time: 06/17/18  9:37 PM  Result Value Ref Range   Glucose-Capillary 253 (H) 70 - 99 mg/dL  Basic metabolic panel     Status: Abnormal   Collection Time: 06/18/18  4:55 AM  Result Value Ref Range   Sodium 135 135 - 145 mmol/L   Potassium 4.2 3.5 - 5.1 mmol/L   Chloride 98 98 - 111 mmol/L    Comment: Please note change in reference range.   CO2 28 22 - 32 mmol/L   Glucose, Bld 114 (H) 70 - 99 mg/dL    Comment: Please note change in reference range.   BUN 83 (H) 8 - 23 mg/dL    Comment: Please note change in reference range.   Creatinine, Ser 2.09 (H) 0.44 - 1.00 mg/dL   Calcium 9.2 8.9 - 10.3 mg/dL   GFR calc non Af Amer 22 (L) >60 mL/min   GFR calc Af Amer 26 (L) >60 mL/min    Comment:  (NOTE) The eGFR has been calculated using the CKD EPI equation. This calculation has not been validated in all clinical situations. eGFR's persistently <60 mL/min signify possible Chronic Kidney Disease.    Anion gap 9 5 - 15    Comment: Performed at [facility] [address] [place], [place] [address]  CBC with Differential/Platelet     Status: Abnormal   Collection Time: 06/18/18  4:55 AM  Result Value Ref Range   WBC 9.1 4.0 - 10.5 K/uL   RBC 2.73 (L) 3.87 - 5.11 MIL/uL   Hemoglobin 8.0 (L) 12.0 - 15.0 g/dL   HCT 25.4 (L) 36.0 - 46.0 %   MCV 93.0 78.0 - 100.0 fL   MCH 29.3 26.0 - 34.0 pg   MCHC 31.5 30.0 - 36.0 g/dL   RDW 17.5 (H) 11.5 - 15.5 %   Platelets 196 150 - 400 K/uL   Neutrophils Relative % 85 %   Neutro Abs 7.7 1.7 - 7.7 K/uL   Lymphocytes Relative 9 %   Lymphs Abs 0.8 0.7 - 4.0 K/uL   Monocytes Relative 5 %   Monocytes Absolute 0.5 0.1 - 1.0 K/uL   Eosinophils Relative 0 %   Eosinophils Absolute 0.0 0.0 - 0.7 K/uL   Basophils Relative 0 %   Basophils Absolute 0.0 0.0 - 0.1 K/uL   Immature Granulocytes 1 %   Abs Immature Granulocytes 0.1 0.0 - 0.1 K/uL    Comment: Performed at [facility], [address] [place], [place] [address]  Glucose, capillary     Status: None   Collection Time: 06/18/18  6:54 AM  Result Value Ref Range   Glucose-Capillary 85 70 - 99 mg/dL     Constitutional: No distress . Vital signs reviewed. HEENT: EOMI, oral membranes moist Neck: supple Cardiovascular: RRR without murmur. No JVD    Respiratory: CTA Bilaterally without wheezes or rales. Normal effort    GI: BS +, non-tender, non-distended  Musc: edema decreased but still 1-2+ bilateral LE Neuro: Alert Motor:  Bilateral upper extremities: 4+/5 proximal distal (stable) Bilateral lower extremities: Hip flexion 3+/5, knee extension 4-/5, ankle dorsiflexion 5/5 (stable) Skin: Warm and dry. Intact. Psych: Normal mood.  Normal behavior.  Assessment/Plan: 1.  Functional deficits secondary to L4-5  Lumbar discitis which require 3+ hours per day of interdisciplinary therapy in a comprehensive inpatient rehab setting. Physiatrist is providing close team supervision and 24 hour management of active medical problems listed below. Physiatrist and rehab team continue to assess barriers to discharge/monitor patient progress toward functional and medical goals. FIM: Function - Bathing Bathing activity did not occur: Refused Position: Bed Body parts bathed by patient: Right arm, Left arm, Chest, Abdomen Body parts bathed by helper: Right lower leg, Left lower leg, Right upper leg, Left upper leg, Back, Front perineal area, Buttocks Assist Level: 2 helpers  Function- Upper Body Dressing/Undressing Upper body dressing/undressing activity did not occur: Refused What is the patient wearing?: Pull over shirt/dress Pull over shirt/dress - Perfomed by patient: Thread/unthread right sleeve, Thread/unthread left sleeve, Pull shirt over trunk Pull over shirt/dress - Perfomed by helper: Put head through opening Orthosis activity level: Performed by helper Assist Level: Touching or steadying assistance(Pt > 75%) Function - Lower Body Dressing/Undressing Lower body dressing/undressing activity did not occur: Refused What is the patient wearing?: Non-skid slipper socks, Shoes Position: Wheelchair/chair at sink Pants- Performed by patient: Thread/unthread right pants leg, Thread/unthread left pants leg, Pull pants up/down Pants- Performed by helper: Pull pants up/down Non-skid slipper socks- Performed by patient: Don/doff right sock, Don/doff left sock Non-skid slipper socks- Performed by helper: Don/doff right sock, Don/doff left sock Shoes - Performed by patient: Don/doff left shoe Shoes - Performed by helper: Don/doff right shoe TED Hose - Performed by helper: Don/doff right TED hose, Don/doff left TED hose Assist for footwear: Partial/moderate assist Assist for  lower body dressing: Touching or steadying assistance (Pt > 75%)  Function - Toileting Toileting activity did not occur: Safety/medical concerns Toileting steps completed by patient: Performs perineal hygiene Toileting steps completed by helper: Adjust clothing prior to toileting, Adjust clothing after toileting Toileting Assistive Devices: Grab bar or rail Assist level: Touching or steadying assistance (Pt.75%)  Function - Air cabin crew transfer activity did not occur: Safety/medical concerns Toilet transfer assistive device: Elevated toilet seat/BSC over toilet, Walker Mechanical lift: Stedy Assist level to toilet: Maximal assist (Pt 25 - 49%/lift and lower) Assist level from toilet: Maximal assist (Pt 25 - 49%/lift and lower) Assist level to bedside commode (at bedside): Moderate assist (Pt 50 - 74%/lift or lower) Assist level from bedside commode (at bedside): Moderate assist (Pt 50 - 74%/lift or lower)  Function - Chair/bed transfer Chair/bed transfer activity did not occur: Refused Chair/bed transfer method: Stand pivot Chair/bed transfer assist level: Touching or steadying assistance (Pt > 75%) Chair/bed transfer assistive device: Walker, Armrests Mechanical lift: Stedy Chair/bed transfer details: Manual facilitation for weight shifting, Verbal cues for technique  Function - Locomotion: Wheelchair Type: Manual Wheelchair activity did not occur: Refused Max wheelchair distance: 150 Assist Level: Supervision or verbal cues Wheel 50 feet with 2 turns activity did not occur: Refused Assist Level: Supervision or verbal cues Wheel 150 feet activity did not occur: Refused Assist Level: Supervision or verbal cues Turns around,maneuvers to table,bed, and toilet,negotiates 3% grade,maneuvers on rugs and over doorsills: No Function - Locomotion: Ambulation Ambulation activity did not occur: Safety/medical concerns Assistive device: Walker-rolling Max distance: 22  ft Assist level: 2 helpers Walk 10 feet activity did not occur: Safety/medical concerns Assist level: 2 helpers Walk 50 feet with 2 turns activity did not occur: Safety/medical concerns Walk 150 feet activity did not occur: Safety/medical concerns Walk 10 feet on uneven surfaces activity did not occur: Safety/medical concerns  Function - Comprehension Comprehension:  Auditory Comprehension assist level: Follows complex conversation/direction with extra time/assistive device  Function - Expression Expression: Verbal Expression assist level: Expresses complex ideas: With extra time/assistive device  Function - Social Interaction Social Interaction assist level: Interacts appropriately with others with medication or extra time (anti-anxiety, antidepressant).  Function - Problem Solving Problem solving assist level: Solves complex 90% of the time/cues < 10% of the time  Function - Memory Memory assist level: Recognizes or recalls 75 - 89% of the time/requires cueing 10 - 24% of the time Patient normally able to recall (first 3 days only): Current season, Location of own room, Staff names and faces, That he or she is in a hospital  Medical Problem List and Plan:  1. Functional deficits secondary to L4-5 osteomyelitis/discitis, lumbar spondylosis   Continue CIR  Xrays stable 2. DVT Prophylaxis/Anticoagulation: Mechanical: Sequential compression devices, below knee Bilateral lower extremities   Doppler negative for DVT in LE  LUE doppler ordered to evaluate for DVT, negative 3. Pain Management: Scheduled oxycodone prior to therapy to help with activity tolerance, decreased to 5 mg on 6/27.   Continue Ultram prn for moderate pain. Monitor and titrate medications to help with neuropathic symptoms. Decadron d/ced.   RLE radicular pain, partially improved on gabapentin to 618m BID, decreased to 300 mg twice a day, DC'd on 6/27 due to lethargy and tremors. Gabapentin 100 3 times a day started  on/3   Low dose dexamethasone DC'd on 6/26  Baclofen changed to 5 mg daily at bedtime, patient unable to tolerate higher doses, DC'd on 6/27 due to lethargy  Lidoderm patch added on 7/2  Tizanidine 2 mg daily started on 7/11, will attempt to minimize medications as much as possible due to sensitivity and sedation  Pain controlled on 7/18 4. Mood: LCSW to follow for evaluation and support.  5. Neuropsych: This patient is capable of making decisions on her own behalf.  6. Skin/Wound Care: routine pressure relief measures  7. Fluids/Electrolytes/Nutrition: Monitor I/O.  8. Lumbar diskitis: Afebrile on scheduled tylenol. IV Abx per ID.  9. Acute on chronic anemia: Treated with iron and procrit on outpatient basis. Baseline Hgb- 9-10 range?   Hemoglobin 8.0 7/18  -aranesp resumed  -expect a bump from diuresis as well  Continue to monitor 10. Symptomatic Bradycardia s/p PPM: Monitor HR bid.   Resolved 11. GIB with thrombocytopenia: Resolved  Cont to monitor 12. New diagnosis T2DM: Hgb A1c- 6.3. Poorly controlled due to recent rounds of steroids--Lantus was titrated upwards for better control. Was not on any medications at home. Monitor BS ac/hs--watch for hypoglycemic episodes as steroids tapered off.  CBG (last 3)  Recent Labs    06/17/18 1643 06/17/18 2137 06/18/18 0654  GLUCAP 215* 253* 85   CBGs continues to be elevated especially during mid day and evening,   -steroids at 168mdaily only and tapering off  -initiated 36m36mmaryl daily---observe for pattern 13. Neuropathy BLE: Continue Gabapentin bid--monitor for SE.  14. Hyponatremia:   Sodium 135 on 7/18  Added protein supplements with meals. monitor for now.   Continue to monitor 15. Stage IV CKD: Baseline SCr 3.5-4.0. Monitor for SE/asterixis with narcotics.   Creatinine 2.09  Continue to monitor with increased lasix 16. CHF  Fluid overloaded after IVF and decrease in diuretics   Lasix increased to 54m54m BID  -nephrology  was asked to follow up with patient, [facility]   06/16/18 0615 06/17/18 0653 06/18/18 0515  Weight: 99.7  kg (219 lb 12.8 oz) 100 kg (220 lb 7.4 oz) 100.4 kg (221 lb 5.5 oz)   -daily renal panels 17. Morbid obesity  Encourage weight loss 18. Essential hypertension  Continue Norvasc 10 mg daily  Hydralazine 50 mg 3 times a day, increased to 75 on 6/24  Fair control 19. Hypoalbuminemia  Supplement initiated 20. Hypokalemia, now with hyperkalemia  Potassium 4.1 7/18  Continue to monitor 21. AMS: Resolved  Likely medication induced  CXR reviewed, ECG reviewed, head CT reviewed, EEG performed - ? Borderline QTC prolongation  Discussed with pharmacy  TSH, free T4 within acceptable range  Free T3 low  Continues to have low grade fever  UA unremarkable.  Urine culture no growth  Blood cultures no growth 22. Hypothyroidism  Continue meds   See #21 23.  Sleep disturbance  Melatonin initiated on 6/30, increased on 7/1  Improved 24. 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

## 2018-06-18 NOTE — Consult Note (Signed)
Pt well known to me.  She has CKD4.  She has had a prolonged hospitalization now on rehab with discitis.  Renal asked to assist with diuresis because of increased LE edema.  There had been a reduction in diuretic and increased edema that has already responded to an increased dose of furosemide.    Past Medical History:  Diagnosis Date  . Arthritis    "knees, thumbs" (03/25/2018)  . CKD (chronic kidney disease), stage IV ([place])   . Diet-controlled diabetes mellitus ([place])   . GERD (gastroesophageal reflux disease)   . Gout    "on daily RX" (03/25/2018)  . Heart murmur   . High cholesterol   . Hypertension   . Hypothyroidism   . Iron deficiency anemia    "had to get an iron infusion"  . Migraine    "used to have them growing up" (03/25/2018)  . Presence of permanent cardiac pacemaker 03/25/2018   Past Surgical History:  Procedure Laterality Date  . APPENDECTOMY    . BIOPSY  05/19/2018   Procedure: BIOPSY;  Surgeon: Ladene Artist, MD;  Location: [facility] ENDOSCOPY;  Service: Endoscopy;;  . ESOPHAGOGASTRODUODENOSCOPY N/A 05/19/2018   Procedure: ESOPHAGOGASTRODUODENOSCOPY (EGD);  Surgeon: Ladene Artist, MD;  Location: [facility] ENDOSCOPY;  Service: Endoscopy;  Laterality: N/A;  . INSERT / REPLACE / REMOVE PACEMAKER  03/25/2018  . IR FLUORO GUIDE CV LINE RIGHT  05/18/2018  . IR LUMBAR DISC ASPIRATION W/IMG GUIDE  05/15/2018  . IR US GUIDE VASC ACCESS RIGHT  05/18/2018  . KNEE ARTHROSCOPY Bilateral   . PACEMAKER IMPLANT N/A 03/25/2018   Procedure: PACEMAKER IMPLANT;  Surgeon: Evans Lance, MD;  Location: [facility] CV LAB;  Service: Cardiovascular;  Laterality: N/A;  . PACEMAKER PLACEMENT Left 03/2018  . RIGHT HEART CATH N/A 03/20/2018   Procedure: RIGHT HEART CATH;  Surgeon: Nigel Mormon, MD;  Location: [facility] CV LAB;  Service: Cardiovascular;  Laterality: N/A;  . TEE WITHOUT CARDIOVERSION N/A 03/20/2018   Procedure: TRANSESOPHAGEAL ECHOCARDIOGRAM (TEE);  Surgeon: Nigel Mormon,  MD;  Location: [facility] ENDOSCOPY;  Service: Cardiovascular;  Laterality: N/A;  . TONSILLECTOMY     Social History:  reports that she has never smoked. She has never used smokeless tobacco. She reports that she drank alcohol. She reports that she does not use drugs. Allergies:  Allergies  Allergen Reactions  . Baclofen     somnolence  . Codeine Other (See Comments)    Increases Pain and couldn't sleep   Family History  Problem Relation Age of Onset  . Hypertension Mother   . Diabetes Mellitus II Father   . Heart disease Father   . Gastric cancer Brother   . Diabetes Mellitus II Brother     Medications:  Scheduled: . acetaminophen  650 mg Oral TID AC & HS  . allopurinol  200 mg Oral Daily  . amLODipine  10 mg Oral Daily  . Chlorhexidine Gluconate Cloth  6 each Topical Q0600  . [START ON 06/26/2018] darbepoetin (ARANESP) injection - NON-DIALYSIS  100 mcg Subcutaneous Q28 days  . ferrous sulfate  325 mg Oral BID WC  . folic acid  1 mg Oral Daily  . furosemide  80 mg Intravenous BID  . glimepiride  1 mg Oral Q breakfast  . hydrALAZINE  75 mg Oral TID  . insulin aspart  0-5 Units Subcutaneous QHS  . insulin aspart  0-9 Units Subcutaneous TID WC  . insulin glargine  20 Units Subcutaneous Daily  .  levothyroxine  75 mcg Oral QAC breakfast  . lidocaine  1 patch Transdermal Q24H  . linezolid  600 mg Oral Q12H  . loratadine  10 mg Oral Daily  . Melatonin  1.5 mg Oral QHS  . multivitamin with minerals   Oral Daily  . oxyCODONE  5 mg Oral BID WC  . pantoprazole  40 mg Oral Q0600  . predniSONE  10 mg Oral Q breakfast   Followed by  . [START ON 06/20/2018] predniSONE  5 mg Oral Q breakfast  . protein supplement shake  11 oz Oral TID  . senna  1 tablet Oral Daily  . simvastatin  40 mg Oral QHS  . tiZANidine  2 mg Oral Daily  . vitamin B-12  1,000 mcg Oral Daily   Continuous: . sodium chloride Stopped (06/10/18 1036)  . sodium chloride 10 mL/hr at 06/12/18 0840  . cefTRIAXone  (ROCEPHIN)  IV 2 g (06/18/18 0749)    ROS: as per HPI Blood pressure 136/68, pulse 70, temperature 98 F (36.7 C), temperature source Oral, resp. rate 17, height '5\' 5"'$  (1.651 m), weight 100.4 kg (221 lb 5.5 oz), SpO2 98 %.  General appearance: alert and cooperative Head: Normocephalic, without obvious abnormality, atraumatic Eyes: negative Ears: normal TM's and external ear canals both ears Nose: Nares normal. Septum midline. Mucosa normal. No drainage or sinus tenderness. Throat: lips, mucosa, and tongue normal; teeth and gums normal Resp: clear to auscultation bilaterally Chest wall: no tenderness Cardio: regular rate and rhythm, S1, S2 normal, no murmur, click, rub or gallop GI: soft, non-tender; bowel sounds normal; no masses,  no organomegaly Extremities: edema 2+ Skin: Skin color, texture, turgor normal. No rashes or lesions Neurologic: Grossly normal Results for orders placed or performed during the hospital encounter of 05/22/18 (from the past 48 hour(s))  Glucose, capillary     Status: Abnormal   Collection Time: 06/16/18  4:34 PM  Result Value Ref Range   Glucose-Capillary 324 (H) 70 - 99 mg/dL   Comment 1 Notify RN   Glucose, capillary     Status: Abnormal   Collection Time: 06/16/18  8:55 PM  Result Value Ref Range   Glucose-Capillary 288 (H) 70 - 99 mg/dL  Basic metabolic panel     Status: Abnormal   Collection Time: 06/17/18  4:00 AM  Result Value Ref Range   Sodium 133 (L) 135 - 145 mmol/L   Potassium 4.8 3.5 - 5.1 mmol/L   Chloride 99 98 - 111 mmol/L    Comment: Please note change in reference range.   CO2 25 22 - 32 mmol/L   Glucose, Bld 195 (H) 70 - 99 mg/dL    Comment: Please note change in reference range.   BUN 82 (H) 8 - 23 mg/dL    Comment: Please note change in reference range.   Creatinine, Ser 2.19 (H) 0.44 - 1.00 mg/dL   Calcium 9.0 8.9 - 10.3 mg/dL   GFR calc non Af Amer 21 (L) >60 mL/min   GFR calc Af Amer 24 (L) >60 mL/min    Comment:  (NOTE) The eGFR has been calculated using the CKD EPI equation. This calculation has not been validated in all clinical situations. eGFR's persistently <60 mL/min signify possible Chronic Kidney Disease.    Anion gap 9 5 - 15    Comment: Performed at [facility] [address], [place], [place] [address]  Glucose, capillary     Status: Abnormal   Collection Time: 06/17/18  6:42 AM  Result Value Ref Range   Glucose-Capillary 161 (H) 70 - 99 mg/dL  Glucose, capillary     Status: Abnormal   Collection Time: 06/17/18 11:45 AM  Result Value Ref Range   Glucose-Capillary 170 (H) 70 - 99 mg/dL   Comment 1 Notify RN   Glucose, capillary     Status: Abnormal   Collection Time: 06/17/18  4:43 PM  Result Value Ref Range   Glucose-Capillary 215 (H) 70 - 99 mg/dL   Comment 1 Notify RN   Glucose, capillary     Status: Abnormal   Collection Time: 06/17/18  9:37 PM  Result Value Ref Range   Glucose-Capillary 253 (H) 70 - 99 mg/dL  Basic metabolic panel     Status: Abnormal   Collection Time: 06/18/18  4:55 AM  Result Value Ref Range   Sodium 135 135 - 145 mmol/L   Potassium 4.2 3.5 - 5.1 mmol/L   Chloride 98 98 - 111 mmol/L    Comment: Please note change in reference range.   CO2 28 22 - 32 mmol/L   Glucose, Bld 114 (H) 70 - 99 mg/dL    Comment: Please note change in reference range.   BUN 83 (H) 8 - 23 mg/dL    Comment: Please note change in reference range.   Creatinine, Ser 2.09 (H) 0.44 - 1.00 mg/dL   Calcium 9.2 8.9 - 10.3 mg/dL   GFR calc non Af Amer 22 (L) >60 mL/min   GFR calc Af Amer 26 (L) >60 mL/min    Comment: (NOTE) The eGFR has been calculated using the CKD EPI equation. This calculation has not been validated in all clinical situations. eGFR's persistently <60 mL/min signify possible Chronic Kidney Disease.    Anion gap 9 5 - 15    Comment: Performed at [facility] [address], [place], [place] [address]  CBC with Differential/Platelet     Status:  Abnormal   Collection Time: 06/18/18  4:55 AM  Result Value Ref Range   WBC 9.1 4.0 - 10.5 K/uL   RBC 2.73 (L) 3.87 - 5.11 MIL/uL   Hemoglobin 8.0 (L) 12.0 - 15.0 g/dL   HCT 25.4 (L) 36.0 - 46.0 %   MCV 93.0 78.0 - 100.0 fL   MCH 29.3 26.0 - 34.0 pg   MCHC 31.5 30.0 - 36.0 g/dL   RDW 17.5 (H) 11.5 - 15.5 %   Platelets 196 150 - 400 K/uL   Neutrophils Relative % 85 %   Neutro Abs 7.7 1.7 - 7.7 K/uL   Lymphocytes Relative 9 %   Lymphs Abs 0.8 0.7 - 4.0 K/uL   Monocytes Relative 5 %   Monocytes Absolute 0.5 0.1 - 1.0 K/uL   Eosinophils Relative 0 %   Eosinophils Absolute 0.0 0.0 - 0.7 K/uL   Basophils Relative 0 %   Basophils Absolute 0.0 0.0 - 0.1 K/uL   Immature Granulocytes 1 %   Abs Immature Granulocytes 0.1 0.0 - 0.1 K/uL    Comment: Performed at [facility], [address] [place], [place] [address]  Glucose, capillary     Status: None   Collection Time: 06/18/18  6:54 AM  Result Value Ref Range   Glucose-Capillary 85 70 - 99 mg/dL  Glucose, capillary     Status: None   Collection Time: 06/18/18 12:01 PM  Result Value Ref Range   Glucose-Capillary 85 70 - 99 mg/dL   No results found.  Assessment:  1 CKD 4 2 Volume excess 3 Lumbar discitis Plan: 1 Increase to IV diuretic for a couple of doses then resume home diuretic dose.  Estanislado Emms 06/18/2018, 1:15 PM

## 2018-06-18 NOTE — Progress Notes (Signed)
Occupational Therapy Session Note  Patient Details  Name: Erika Fry MRN: [id number] Date of Birth: 11-08-1946  Today's Date: 06/18/2018 OT Individual Time: 1275-1700 OT Individual Time Calculation (min): 60 min    Short Term Goals: Week 3:  OT Short Term Goal 1 (Week 3): STGs = LTGs (LTGs downgraded 06/10/18)  Skilled Therapeutic Interventions/Progress Updates:    Treatment session with focus on ADL retraining with bathing, dressing, and toileting.  Pt received supine in bed willing to engage in bathing and dressing from EOB.  Completed bathing with setup for equipment with ability to wash lower legs and feet with long handled sponge, min guard/steadying assist when standing to wash buttocks.  Pt completed dressing with setup assist and use of AE when donning pants and non-slip socks.  Pt reports need to toilet.  Completed stand pivot transfer bed > wide BSC with RW with min assist.  Pt able to manage clothing up and down during toileting, but required assistance for thoroughness with hygiene post BM.  Pt completed grooming tasks seated at sink without assist.  Pt left upright in w/c with legs elevated and TEDS donned for edema management.  Therapy Documentation Precautions:  Precautions Precautions: Fall Precaution Comments: Pacemaker placement 4 weeks ago Required Braces or Orthoses: Spinal Brace Spinal Brace: Lumbar corset Restrictions Weight Bearing Restrictions: No Pain: Pain Assessment Pain Scale: 0-10 Pain Score: 5  Pain Type: Chronic pain Pain Location: Hip Pain Orientation: Right Pain Descriptors / Indicators: Aching Pain Frequency: Intermittent Pain Onset: On-going Patients Stated Pain Goal: 2 Pain Intervention(s): Medication (See eMAR)  See Function Navigator for Current Functional Status.   Therapy/Group: Individual Therapy  Simonne Come 06/18/2018, 12:14 PM

## 2018-06-18 NOTE — Progress Notes (Signed)
Physical Therapy Session Note  Patient Details  Name: Erika Fry MRN: [id number] Date of Birth: 04-17-1946  Today's Date: 06/17/2018 PT Individual Time: 1400-1500   60 min   Short Term Goals: Week 4:  PT Short Term Goal 1 (Week 4): STG =LTG due to ELOS    Skilled Therapeutic Interventions/Progress Updates:   Pt received sitting in WC and agreeable to PT. Pt transported to rehab gym in [facility].   Gait training instructed by PT x 57f with RW, min assist and min cues for posture, AD management.   PT instructed pt in step management to allow access to home environment. Uo/down 2 inch step in parallel bars x 2 with min assist. Up/down 4 inch step in parallel bars x 2 with min-mod assist from PT. Pt then instructed in 4 inch curb step with RW x 2 with mod assist from PT. Moderate cues for step to gait pattern, AD management, and assist to prevent posterior LOB and stabilize RW.   Throughout treatment, pt performed sit<>[facility] overall with min cues for proper UE placement and increased anterior weight shift.   Pt instructed in [facility] to strengthen BUE x 1490fwith supervision assist and min cues for improved use of momentum to reduce overall fatigue.   Nustep BUE and BLE endurance training 2 x 3 min level 5>6. Min cues throughout from PT for full LE ROM and proper speed. Pt rates Borg RPE 14/20 upon completion. Stand pivot transfer to and from nustep with min assist and min cues for safety and AD management.   Patient returned to room and left sitting in [facility] call bell in reach and all needs met.          Therapy Documentation Precautions:  Precautions Precautions: Fall Precaution Comments: Pacemaker placement 4 weeks ago Required Braces or Orthoses: Spinal Brace Spinal Brace: Lumbar corset Restrictions Weight Bearing Restrictions: No Vital Signs: Therapy Vitals Temp: 98 F (36.7 C) Temp Source: Oral Pulse Rate:  70 Resp: 17 BP: 134/71 Patient Position (if appropriate): Lying Oxygen Therapy SpO2: 98 % O2 Device: Room Air Pain: Pain Assessment Pain Score: 3    See Function Navigator for Current Functional Status.   Therapy/Group: Individual Therapy  AuLorie Phenix/18/2019, 7:37 AM

## 2018-06-19 ENCOUNTER — Inpatient Hospital Stay (HOSPITAL_COMMUNITY): Payer: Medicare Other | Admitting: Occupational Therapy

## 2018-06-19 ENCOUNTER — Inpatient Hospital Stay (HOSPITAL_COMMUNITY): Payer: Medicare Other | Admitting: Physical Therapy

## 2018-06-19 LAB — RENAL FUNCTION PANEL
Albumin: 2.6 g/dL — ABNORMAL LOW (ref 3.5–5.0)
Anion gap: 12 (ref 5–15)
BUN: 86 mg/dL — AB (ref 8–23)
CALCIUM: 9.1 mg/dL (ref 8.9–10.3)
CO2: 29 mmol/L (ref 22–32)
CREATININE: 2.28 mg/dL — AB (ref 0.44–1.00)
Chloride: 95 mmol/L — ABNORMAL LOW (ref 98–111)
GFR, EST AFRICAN AMERICAN: 23 mL/min — AB (ref >60)
GFR, EST NON AFRICAN AMERICAN: 20 mL/min — AB (ref >60)
GLUCOSE: 80 mg/dL (ref 70–99)
Phosphorus: 4 mg/dL (ref 2.5–4.6)
Potassium: 3.8 mmol/L (ref 3.5–5.1)
SODIUM: 136 mmol/L (ref 135–145)

## 2018-06-19 LAB — CBC
HEMATOCRIT: 26.3 % — AB (ref 36.0–46.0)
Hemoglobin: 8.3 g/dL — ABNORMAL LOW (ref 12.0–15.0)
MCH: 29.3 pg (ref 26.0–34.0)
MCHC: 31.6 g/dL (ref 30.0–36.0)
MCV: 92.9 fL (ref 78.0–100.0)
Platelets: 202 10*3/uL (ref 150–400)
RBC: 2.83 MIL/uL — AB (ref 3.87–5.11)
RDW: 18 % — ABNORMAL HIGH (ref 11.5–15.5)
WBC: 10 10*3/uL (ref 4.0–10.5)

## 2018-06-19 LAB — GLUCOSE, CAPILLARY
GLUCOSE-CAPILLARY: 103 mg/dL — AB (ref 70–99)
GLUCOSE-CAPILLARY: 175 mg/dL — AB (ref 70–99)
GLUCOSE-CAPILLARY: 190 mg/dL — AB (ref 70–99)
Glucose-Capillary: 76 mg/dL (ref 70–99)

## 2018-06-19 MED ORDER — FUROSEMIDE 40 MG PO TABS: 80.0000 mg | ORAL_TABLET | Freq: Two times a day (BID) | ORAL | Status: DC

## 2018-06-19 MED ORDER — FUROSEMIDE 40 MG PO TABS
80.0000 mg | ORAL_TABLET | Freq: Two times a day (BID) | ORAL | Status: DC
  Administered 2018-06-19 – 2018-06-22 (×6): 80 mg via ORAL
  Filled 2018-06-19 (×6): qty 2

## 2018-06-19 NOTE — Plan of Care (Signed)
  Problem: Spiritual Needs Goal: Ability to function at adequate level Outcome: Progressing   Problem: RH BLADDER ELIMINATION Goal: RH STG MANAGE BLADDER WITH ASSISTANCE Description STG Manage Bladder With Assistance. Mod   Outcome: Progressing Flowsheets (Taken 06/19/2018 1645) STG: Pt will manage bladder with assistance: 4-Minimal assistance   Problem: RH PAIN MANAGEMENT Goal: RH STG PAIN MANAGED AT OR BELOW PT'S PAIN GOAL Description Less than 4  Outcome: Progressing

## 2018-06-19 NOTE — Progress Notes (Signed)
Assessment:  1 CKD 4 2 Volume excess 3 Lumbar discitis Plan: 1 resume home PO diuretic dose.   Subjective: Interval History: great diuresis...4.45 Liters last 24hrs  Objective: Vital signs in last 24 hours: Temp:  [98 F (36.7 C)-98.4 F (36.9 C)] 98.4 F (36.9 C) (07/18 1952) Pulse Rate:  [79-102] 102 (07/18 1952) Resp:  [16-18] 18 (07/18 1952) BP: (130-137)/(72-79) 137/79 (07/18 1952) SpO2:  [98 %] 98 % (07/18 1952) Weight:  [95.3 kg (210 lb 1.6 oz)] 95.3 kg (210 lb 1.6 oz) (07/19 0500) Weight change: -5.1 kg (-11 lb 3.9 oz)  Intake/Output from previous day: 07/18 0701 - 07/19 0700 In: 340 [P.O.:300; I.V.:40] Out: 4450 [Urine:4450] Intake/Output this shift: Total I/O In: 120 [P.O.:120] Out: 450 [Urine:450]  General appearance: alert and cooperative Back: difficult exam wearing brace Extremities: edema 2+  Lab Results: Recent Labs    06/18/18 0455 06/19/18 0430  WBC 9.1 10.0  HGB 8.0* 8.3*  HCT 25.4* 26.3*  PLT 196 202   BMET:  Recent Labs    06/18/18 0455 06/19/18 0430  NA 135 136  K 4.2 3.8  CL 98 95*  CO2 28 29  GLUCOSE 114* 80  BUN 83* 86*  CREATININE 2.09* 2.28*  CALCIUM 9.2 9.1   No results for input(s): PTH in the last 72 hours. Iron Studies: No results for input(s): IRON, TIBC, TRANSFERRIN, FERRITIN in the last 72 hours. Studies/Results: No results found.  Scheduled: . acetaminophen  650 mg Oral TID AC & HS  . allopurinol  200 mg Oral Daily  . amLODipine  10 mg Oral Daily  . Chlorhexidine Gluconate Cloth  6 each Topical Q0600  . [START ON 06/26/2018] darbepoetin (ARANESP) injection - NON-DIALYSIS  100 mcg Subcutaneous Q28 days  . ferrous sulfate  325 mg Oral BID WC  . folic acid  1 mg Oral Daily  . furosemide  80 mg Oral BID  . hydrALAZINE  75 mg Oral TID  . insulin aspart  0-5 Units Subcutaneous QHS  . insulin aspart  0-9 Units Subcutaneous TID WC  . insulin glargine  20 Units Subcutaneous Daily  . levothyroxine  75 mcg Oral  QAC breakfast  . lidocaine  1 patch Transdermal Q24H  . linezolid  600 mg Oral Q12H  . loratadine  10 mg Oral Daily  . Melatonin  1.5 mg Oral QHS  . multivitamin with minerals   Oral Daily  . oxyCODONE  5 mg Oral BID WC  . pantoprazole  40 mg Oral Q0600  . [START ON 06/20/2018] predniSONE  5 mg Oral Q breakfast  . protein supplement shake  11 oz Oral TID  . senna  1 tablet Oral Daily  . simvastatin  40 mg Oral QHS  . tiZANidine  2 mg Oral Daily  . vitamin B-12  1,000 mcg Oral Daily     LOS: 28 days   Estanislado Emms 06/19/2018,1:12 PM

## 2018-06-19 NOTE — Progress Notes (Signed)
Physical Therapy Session Note  Patient Details  Name: Erika Fry MRN: [id number] Date of Birth: [birth year]  Today's Date: 06/19/2018 PT Individual Time: 0805-0900  AND 1130-1200 PT Individual Time Calculation (min): 55 min 30 min   Short Term Goals: Week 4:  PT Short Term Goal 1 (Week 4): STG =LTG due to ELOS    Skilled Therapeutic Interventions/Progress Updates:   Pt received supine in bed and agreeable to PT. Supine>sit transfer with supervision assist, HOB elevated and heavy use of bed rails.   Sit<>stand and stand pivot Transfers from various surfaces and heights With RW and min assist throughout treatment, min cues from PT for set up and safety.   WC mobility x 150f and supervision assist from PT for safety. Gait training x 45 ft with min assist and UE support on RW.   Stair mArchivist X 2(6" step) with mod assist from PT to facilitate hip flexion and allow advancement of the LLE onto step using step to gait pattern.   5xSTS = 2 min 15sec with min assist and BUE support on arm rests  [facility] and all needs met.     Session 2.  PT instructed pt in seated BLE HEP with hand out provided to complete with family upon d/c. SAQ, hip abduction with manual resistance, marches, isometric hip adduction, calf raises, all completed x 10 BLE with min cues for technique and education of adaptations to progress difficulty as appropriate. Pt left sitting in WC with call bell in reach all needs met.        Therapy Documentation Precautions:  Precautions Precautions: Fall Precaution Comments: Pacemaker placement 4 weeks ago Required Braces or Orthoses: Spinal Brace Spinal Brace: Lumbar corset Restrictions Weight Bearing Restrictions: No Vital Signs: Therapy Vitals Pulse Rate: 72 BP: 136/64 Patient Position (if appropriate): Lying Pain: Pain Assessment Faces Pain Scale: Hurts a little bit   See  Function Navigator for Current Functional Status.   Therapy/Group: Individual Therapy  ALorie Phenix7/19/2019, 3:11 PM

## 2018-06-19 NOTE — Progress Notes (Signed)
Social Work Patient ID: Erika Fry, female   DOB: 08-Dec-1945, 72 y.o.   MRN: [id number]  Alerted by MD this morning that pt still on hold for d/c until Monday (target) due to medical issues.  Tx team aware and I have alerted [facility] (supplying abx) and [facility] (providing HHRN, PT, OT).  Pt and family aware.  Kelsen Celona, LCSW

## 2018-06-19 NOTE — Progress Notes (Signed)
Occupational Therapy Session Note  Patient Details  Name: Erika Fry MRN: [id number] Date of Birth: 12-09-1945  Today's Date: 06/19/2018 OT Individual Time: 1021-1173 OT Individual Time Calculation (min): 41 min   Short Term Goals: Week 3:  OT Short Term Goal 1 (Week 3): STGs = LTGs (LTGs downgraded 06/10/18)  Skilled Therapeutic Interventions/Progress Updates:    Pt greeted sitting in wc after finishing lunch and agreeable to OT treatment session. Pt reported need for bathroom. Pt declined transfer onto toilet despite encouragement and completed stand-pivot to [facility] with RW and min A. With encouragement, pt able to assist with clothing management, but still needed assist to get pants all the way off of hips. Pt voided bladder successfully, but needed assistance for peri-care. Encouraged pt to try alternating UEs to pull pants back up over hips, but pt limited by fear of falling. Min A pivot back to wc with RW. Pt took seated rest break. Discussed dc plan, home set-up, and home modifications for safe ADL participation. Pt then completed stand-pivot w/ RW back to bed, doffed LSO seated EOB, then returned to supine with min A to lift LE into bed. Pt left semi-reclined with needs met.   Therapy Documentation Precautions:  Precautions Precautions: Fall Precaution Comments: Pacemaker placement 4 weeks ago Required Braces or Orthoses: Spinal Brace Spinal Brace: Lumbar corset Restrictions Weight Bearing Restrictions: No Pain: Pain Assessment Pain Scale: 0-10 Pain Score: 5  Faces Pain Scale: Hurts a little bit Pain Type: Chronic pain Pain Location: Hip Pain Intervention(s);Repositioned ADL: ADL ADL Comments: See functional navigator  See Function Navigator for Current Functional Status.   Therapy/Group: Individual Therapy  Valma Cava 06/19/2018, 1:45 PM

## 2018-06-19 NOTE — Progress Notes (Addendum)
Subjective/Complaints: Feeling better with diuresis. Moving better with therapies  ROS: Patient denies fever, rash, sore throat, blurred vision, nausea, vomiting, diarrhea, cough, shortness of breath or chest pain, joint or back pain, headache, or mood change.   Objective: Vital Signs: Blood pressure 137/79, pulse (!) 102, temperature 98.4 F (36.9 C), temperature source Oral, resp. rate 18, height '5\' 5"'$  (1.651 m), weight 95.3 kg (210 lb 1.6 oz), SpO2 98 %. No results found. Results for orders placed or performed during the hospital encounter of 05/22/18 (from the past 72 hour(s))  Glucose, capillary     Status: Abnormal   Collection Time: 06/16/18 11:57 AM  Result Value Ref Range   Glucose-Capillary 167 (H) 70 - 99 mg/dL  Glucose, capillary     Status: Abnormal   Collection Time: 06/16/18  4:34 PM  Result Value Ref Range   Glucose-Capillary 324 (H) 70 - 99 mg/dL   Comment 1 Notify RN   Glucose, capillary     Status: Abnormal   Collection Time: 06/16/18  8:55 PM  Result Value Ref Range   Glucose-Capillary 288 (H) 70 - 99 mg/dL  Basic metabolic panel     Status: Abnormal   Collection Time: 06/17/18  4:00 AM  Result Value Ref Range   Sodium 133 (L) 135 - 145 mmol/L   Potassium 4.8 3.5 - 5.1 mmol/L   Chloride 99 98 - 111 mmol/L    Comment: Please note change in reference range.   CO2 25 22 - 32 mmol/L   Glucose, Bld 195 (H) 70 - 99 mg/dL    Comment: Please note change in reference range.   BUN 82 (H) 8 - 23 mg/dL    Comment: Please note change in reference range.   Creatinine, Ser 2.19 (H) 0.44 - 1.00 mg/dL   Calcium 9.0 8.9 - 10.3 mg/dL   GFR calc non Af Amer 21 (L) >60 mL/min   GFR calc Af Amer 24 (L) >60 mL/min    Comment: (NOTE) The eGFR has been calculated using the CKD EPI equation. This calculation has not been validated in all clinical situations. eGFR's persistently <60 mL/min signify possible Chronic Kidney Disease.    Anion gap 9 5 - 15    Comment: Performed  at [facility] [address] [place], [place] [address]  Glucose, capillary     Status: Abnormal   Collection Time: 06/17/18  6:42 AM  Result Value Ref Range   Glucose-Capillary 161 (H) 70 - 99 mg/dL  Glucose, capillary     Status: Abnormal   Collection Time: 06/17/18 11:45 AM  Result Value Ref Range   Glucose-Capillary 170 (H) 70 - 99 mg/dL   Comment 1 Notify RN   Glucose, capillary     Status: Abnormal   Collection Time: 06/17/18  4:43 PM  Result Value Ref Range   Glucose-Capillary 215 (H) 70 - 99 mg/dL   Comment 1 Notify RN   Glucose, capillary     Status: Abnormal   Collection Time: 06/17/18  9:37 PM  Result Value Ref Range   Glucose-Capillary 253 (H) 70 - 99 mg/dL  Basic metabolic panel     Status: Abnormal   Collection Time: 06/18/18  4:55 AM  Result Value Ref Range   Sodium 135 135 - 145 mmol/L   Potassium 4.2 3.5 - 5.1 mmol/L   Chloride 98 98 - 111 mmol/L    Comment: Please note change in reference range.   CO2 28 22 - 32 mmol/L  Glucose, Bld 114 (H) 70 - 99 mg/dL    Comment: Please note change in reference range.   BUN 83 (H) 8 - 23 mg/dL    Comment: Please note change in reference range.   Creatinine, Ser 2.09 (H) 0.44 - 1.00 mg/dL   Calcium 9.2 8.9 - 10.3 mg/dL   GFR calc non Af Amer 22 (L) >60 mL/min   GFR calc Af Amer 26 (L) >60 mL/min    Comment: (NOTE) The eGFR has been calculated using the CKD EPI equation. This calculation has not been validated in all clinical situations. eGFR's persistently <60 mL/min signify possible Chronic Kidney Disease.    Anion gap 9 5 - 15    Comment: Performed at [facility] [address] [place], [place] [address]  CBC with Differential/Platelet     Status: Abnormal   Collection Time: 06/18/18  4:55 AM  Result Value Ref Range   WBC 9.1 4.0 - 10.5 K/uL   RBC 2.73 (L) 3.87 - 5.11 MIL/uL   Hemoglobin 8.0 (L) 12.0 - 15.0 g/dL   HCT 25.4 (L) 36.0 - 46.0 %   MCV 93.0 78.0 - 100.0 fL   MCH 29.3 26.0 - 34.0  pg   MCHC 31.5 30.0 - 36.0 g/dL   RDW 17.5 (H) 11.5 - 15.5 %   Platelets 196 150 - 400 K/uL   Neutrophils Relative % 85 %   Neutro Abs 7.7 1.7 - 7.7 K/uL   Lymphocytes Relative 9 %   Lymphs Abs 0.8 0.7 - 4.0 K/uL   Monocytes Relative 5 %   Monocytes Absolute 0.5 0.1 - 1.0 K/uL   Eosinophils Relative 0 %   Eosinophils Absolute 0.0 0.0 - 0.7 K/uL   Basophils Relative 0 %   Basophils Absolute 0.0 0.0 - 0.1 K/uL   Immature Granulocytes 1 %   Abs Immature Granulocytes 0.1 0.0 - 0.1 K/uL    Comment: Performed at [facility], [address], [place], [place] [address]  Glucose, capillary     Status: None   Collection Time: 06/18/18  6:54 AM  Result Value Ref Range   Glucose-Capillary 85 70 - 99 mg/dL  Glucose, capillary     Status: None   Collection Time: 06/18/18 12:01 PM  Result Value Ref Range   Glucose-Capillary 85 70 - 99 mg/dL  Glucose, capillary     Status: Abnormal   Collection Time: 06/18/18  4:46 PM  Result Value Ref Range   Glucose-Capillary 126 (H) 70 - 99 mg/dL   Comment 1 Notify RN   Glucose, capillary     Status: Abnormal   Collection Time: 06/18/18  9:11 PM  Result Value Ref Range   Glucose-Capillary 138 (H) 70 - 99 mg/dL  Renal function panel     Status: Abnormal   Collection Time: 06/19/18  4:30 AM  Result Value Ref Range   Sodium 136 135 - 145 mmol/L   Potassium 3.8 3.5 - 5.1 mmol/L   Chloride 95 (L) 98 - 111 mmol/L    Comment: Please note change in reference range.   CO2 29 22 - 32 mmol/L   Glucose, Bld 80 70 - 99 mg/dL    Comment: Please note change in reference range.   BUN 86 (H) 8 - 23 mg/dL    Comment: Please note change in reference range.   Creatinine, Ser 2.28 (H) 0.44 - 1.00 mg/dL   Calcium 9.1 8.9 - 10.3 mg/dL   Phosphorus 4.0 2.5 -  4.6 mg/dL   Albumin 2.6 (L) 3.5 - 5.0 g/dL   GFR calc non Af Amer 20 (L) >60 mL/min   GFR calc Af Amer 23 (L) >60 mL/min    Comment: (NOTE) The eGFR has been calculated using the CKD EPI equation. This  calculation has not been validated in all clinical situations. eGFR's persistently <60 mL/min signify possible Chronic Kidney Disease.    Anion gap 12 5 - 15    Comment: Performed at [facility] [address] [place], [place] [address]  CBC     Status: Abnormal   Collection Time: 06/19/18  4:30 AM  Result Value Ref Range   WBC 10.0 4.0 - 10.5 K/uL   RBC 2.83 (L) 3.87 - 5.11 MIL/uL   Hemoglobin 8.3 (L) 12.0 - 15.0 g/dL   HCT 26.3 (L) 36.0 - 46.0 %   MCV 92.9 78.0 - 100.0 fL   MCH 29.3 26.0 - 34.0 pg   MCHC 31.6 30.0 - 36.0 g/dL   RDW 18.0 (H) 11.5 - 15.5 %   Platelets 202 150 - 400 K/uL    Comment: Performed at [facility], [place] [address] [place], [place] [address]  Glucose, capillary     Status: None   Collection Time: 06/19/18  6:44 AM  Result Value Ref Range   Glucose-Capillary 76 70 - 99 mg/dL     Constitutional: No distress . Vital signs reviewed. HEENT: EOMI, oral membranes moist Neck: supple Cardiovascular: RRR without murmur. No JVD    Respiratory: CTA Bilaterally without wheezes or rales. Normal effort    GI: BS +, non-tender, non-distended  Musc: edema decreased but still 1+ bilateral LE Neuro: Alert Motor:  Bilateral upper extremities: 4+/5 proximal distal (stable) Bilateral lower extremities: Hip flexion 3+/5, knee extension 4-/5, ankle dorsiflexion 5/5 (stable) Skin: Warm and dry. Intact. Psych: Normal mood.  Normal behavior.  Assessment/Plan: 1. Functional deficits secondary to L4-5 Lumbar discitis which require 3+ hours per day of interdisciplinary therapy in a comprehensive inpatient rehab setting. Physiatrist is providing close team supervision and 24 hour management of active medical problems listed below. Physiatrist and rehab team continue to assess barriers to discharge/monitor patient progress toward functional and medical goals. FIM: Function - Bathing Bathing activity did not occur: Refused Position: Sitting EOB Body parts bathed  by patient: Right arm, Left arm, Chest, Abdomen, Front perineal area, Buttocks, Right upper leg, Left upper leg, Right lower leg, Left lower leg Body parts bathed by helper: Back Assist Level: Touching or steadying assistance(Pt > 75%)  Function- Upper Body Dressing/Undressing Upper body dressing/undressing activity did not occur: Refused What is the patient wearing?: Pull over shirt/dress, Orthosis Pull over shirt/dress - Perfomed by patient: Thread/unthread right sleeve, Thread/unthread left sleeve, Pull shirt over trunk, Put head through opening Pull over shirt/dress - Perfomed by helper: Put head through opening Orthosis activity level: Performed by helper Assist Level: Set up Set up : To obtain clothing/put away, To apply TLSO, cervical collar Function - Lower Body Dressing/Undressing Lower body dressing/undressing activity did not occur: Refused What is the patient wearing?: Pants, Non-skid slipper socks Position: Wheelchair/chair at sink Pants- Performed by patient: Thread/unthread right pants leg, Thread/unthread left pants leg, Pull pants up/down Pants- Performed by helper: Pull pants up/down Non-skid slipper socks- Performed by patient: Don/doff right sock, Don/doff left sock Non-skid slipper socks- Performed by helper: Don/doff right sock, Don/doff left sock Shoes - Performed by patient: Don/doff left shoe Shoes - Performed by helper: Don/doff  right shoe TED Hose - Performed by helper: Don/doff right TED hose, Don/doff left TED hose Assist for footwear: Supervision/touching assist Assist for lower body dressing: Touching or steadying assistance (Pt > 75%)  Function - Toileting Toileting activity did not occur: Safety/medical concerns Toileting steps completed by patient: Adjust clothing prior to toileting, Adjust clothing after toileting Toileting steps completed by helper: Performs perineal hygiene Toileting Assistive Devices: Grab bar or rail Assist level: (Mod  assist)  Function - Air cabin crew transfer activity did not occur: Safety/medical concerns Toilet transfer assistive device: Bedside commode, Walker Mechanical lift: Stedy Assist level to toilet: Maximal assist (Pt 25 - 49%/lift and lower) Assist level from toilet: Maximal assist (Pt 25 - 49%/lift and lower) Assist level to bedside commode (at bedside): Touching or steadying assistance (Pt > 75%) Assist level from bedside commode (at bedside): Touching or steadying assistance (Pt > 75%)  Function - Chair/bed transfer Chair/bed transfer activity did not occur: N/A Chair/bed transfer method: Stand pivot Chair/bed transfer assist level: Touching or steadying assistance (Pt > 75%) Chair/bed transfer assistive device: Armrests, Walker Mechanical lift: Stedy Chair/bed transfer details: Manual facilitation for weight shifting  Function - Locomotion: Wheelchair Type: Manual Wheelchair activity did not occur: Refused Max wheelchair distance: 120f  Assist Level: Supervision or verbal cues Wheel 50 feet with 2 turns activity did not occur: Refused Assist Level: Supervision or verbal cues Wheel 150 feet activity did not occur: Refused Assist Level: Supervision or verbal cues Turns around,maneuvers to table,bed, and toilet,negotiates 3% grade,maneuvers on rugs and over doorsills: No Function - Locomotion: Ambulation Ambulation activity did not occur: Safety/medical concerns Assistive device: Walker-rolling Max distance: 50 Assist level: Touching or steadying assistance (Pt > 75%) Walk 10 feet activity did not occur: Safety/medical concerns Assist level: Touching or steadying assistance (Pt > 75%) Walk 50 feet with 2 turns activity did not occur: Safety/medical concerns Assist level: Touching or steadying assistance (Pt > 75%) Walk 150 feet activity did not occur: Safety/medical concerns Walk 10 feet on uneven surfaces activity did not occur: Safety/medical concerns  Function -  Comprehension Comprehension: Auditory Comprehension assist level: Follows complex conversation/direction with extra time/assistive device  Function - Expression Expression: Verbal Expression assist level: Expresses complex ideas: With extra time/assistive device  Function - Social Interaction Social Interaction assist level: Interacts appropriately with others with medication or extra time (anti-anxiety, antidepressant).  Function - Problem Solving Problem solving assist level: Solves complex 90% of the time/cues < 10% of the time  Function - Memory Memory assist level: Recognizes or recalls 75 - 89% of the time/requires cueing 10 - 24% of the time Patient normally able to recall (first 3 days only): Current season, Location of own room, Staff names and faces, That he or she is in a hospital  Medical Problem List and Plan:  1. Functional deficits secondary to L4-5 osteomyelitis/discitis, lumbar spondylosis   Continue CIR  -extend stay through weekend for diuresis 2. DVT Prophylaxis/Anticoagulation: Mechanical: Sequential compression devices, below knee Bilateral lower extremities   Doppler negative for DVT in LE  LUE doppler ordered to evaluate for DVT, negative 3. Pain Management: Scheduled oxycodone prior to therapy to help with activity tolerance, decreased to 5 mg on 6/27.   Continue Ultram prn for moderate pain. Monitor and titrate medications to help with neuropathic symptoms. Decadron d/ced.   RLE radicular pain, partially improved on gabapentin to 6048mBID, decreased to 300 mg twice a day, DC'd on 6/27 due to lethargy and tremors. Gabapentin 100 3  times a day started on/3   Low dose dexamethasone DC'd on 6/26  Baclofen changed to 5 mg daily at bedtime, patient unable to tolerate higher doses, DC'd on 6/27 due to lethargy  Lidoderm patch added on 7/2  Tizanidine 2 mg daily started on 7/11, will attempt to minimize medications as much as possible due to sensitivity and  sedation  Pain controlled on 7/18 4. Mood: LCSW to follow for evaluation and support.  5. Neuropsych: This patient is capable of making decisions on her own behalf.  6. Skin/Wound Care: routine pressure relief measures  7. Fluids/Electrolytes/Nutrition: Monitor I/O.  8. Lumbar diskitis: Afebrile on scheduled tylenol. IV Abx per ID.  9. Acute on chronic anemia: Treated with iron and procrit on outpatient basis. Baseline Hgb- 9-10 range?   Hemoglobin 8.0 7/18--->8.3 7.19  -aranesp resumed   -no active blood loss, some increase from diuresis  -recheck tomorrow 10. Symptomatic Bradycardia s/p PPM: Monitor HR bid.   Resolved 11. GIB with thrombocytopenia: Resolved  Cont to monitor 12. New diagnosis T2DM: Hgb A1c- 6.3. Poorly controlled due to recent rounds of steroids--Lantus was titrated upwards for better control. Was not on any medications at home. Monitor BS ac/hs--watch for hypoglycemic episodes as steroids tapered off.  CBG (last 3)  Recent Labs    06/18/18 1646 06/18/18 2111 06/19/18 0644  GLUCAP 126* 138* 76       -steroids at 23m daily today, 574mdaily x 2 days, then off  -initiated 73m46mmaryl daily on 7/17 ---sugars now too low---will dc amaryl 13. Neuropathy BLE: Continue Gabapentin bid--monitor for SE.  14. Hyponatremia:   Sodium 135 on 7/18  Added protein supplements with meals. monitor for now.   Continue to monitor 15. Stage IV CKD: Baseline SCr 3.5-4.0. Monitor for SE/asterixis with narcotics.   Creatinine 2.09  Continue to monitor with increased lasix 16. CHF  Fluid overloaded after IVF and decrease in diuretics   Lasix increased to 46m16m BID---back to 46mg53mbid per nephro  -weight,edema decreased  -nephrology was asked to follow up with patient, case reviewed with them [facility]   06/17/18 0653 06/18/18 0515 06/19/18 0500  Weight: 100 kg (220 lb 7.4 oz) 100.4 kg (221 lb 5.5 oz) 95.3 kg (210 lb 1.6 oz)   -daily labs 17. Morbid obesity  Encourage  weight loss 18. Essential hypertension  Continue Norvasc 10 mg daily  Hydralazine 50 mg 3 times a day, increased to 75 on 6/24  Fair control 19. Hypoalbuminemia  Supplement initiated 20. Hypokalemia, now with hyperkalemia  Potassium 4.1 7/18  Continue to monitor 21. AMS: Resolved  Likely medication induced  CXR reviewed, ECG reviewed, head CT reviewed, EEG performed - ? Borderline QTC prolongation  Discussed with pharmacy  TSH, free T4 within acceptable range  Free T3 low  Continues to have low grade fever  UA unremarkable.  Urine culture no growth  Blood cultures no growth 22. Hypothyroidism  Continue meds   See #21 23.  Sleep disturbance  Melatonin initiated on 6/30, increased on 7/1  Improved 24. 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

## 2018-06-19 NOTE — Patient Care Conference (Signed)
Inpatient RehabilitationTeam Conference and Plan of Care Update Date: 06/17/2018   Time: 11:30 AM    Patient Name: Erika Fry      Medical Record Number: [id number]  Date of Birth: 12/09/1945 Sex: Female         Room/Bed: 4W12C/4W12C-01 Payor Info: Payor: [employer] MEDICARE / Plan: [facility] MEDICARE / Product Type: *No Product type* /    Admitting Diagnosis: Debility Osteomyelins  Admit Date/Time:  05/22/2018  2:57 PM Admission Comments: No comment available   Primary Diagnosis:  Lumbar discitis Principal Problem: Lumbar discitis  Patient Active Problem List   Diagnosis Date Noted  . Fluid retention   . Pain   . Generalized edema   . Steroid-induced hyperglycemia   . HCAP (healthcare-associated pneumonia)   . Hypokalemia   . Hypertensive crisis   . Sleep disturbance   . Delirium   . SOB (shortness of breath)   . Somnolence   . Hyperkalemia   . Edema of hand   . Acute right-sided low back pain with right-sided sciatica   . Radicular pain   . Acute blood loss anemia   . Anemia of chronic disease   . Thrombocytopenia ([place])   . New onset type 2 diabetes mellitus ([place])   . Morbid obesity ([place])   . Hyponatremia   . Benign essential HTN   . Hypoalbuminemia due to protein-calorie malnutrition ([place])   . Occult blood in stools   . Gastric polyp   . Lumbar discitis 05/17/2018  . Blood loss anemia 05/12/2018  . Hyperlipidemia associated with type 2 diabetes mellitus ([place]) 05/10/2018  . Essential hypertension 04/29/2018  . CKD (chronic kidney disease), stage IV ([place])   . Diet-controlled diabetes mellitus ([place])   . GERD (gastroesophageal reflux disease)   . Gout   . High cholesterol   . Hypothyroidism   . Iron deficiency anemia   . AV block, Mobitz 2 03/25/2018  . Mobitz type 2 second degree AV block 03/20/2018  . Aortic stenosis 03/20/2018  . Exertional dyspnea 03/19/2018    Expected Discharge Date: Expected Discharge Date: 06/19/18  Team Members  Present: Physician leading conference: Dr. Alysia Penna Social Worker Present: Lennart Pall, LCSW Nurse Present: Other (comment)(Latoya Drue Flirt, RN) PT Present: Barrie Folk, PT;[name], PTA SLP Present: Windell Moulding, SLP PPS Coordinator present : Daiva Nakayama, RN, CRRN     Current Status/Progress Goal Weekly Team Focus  Medical   pain improving. fluid overload  now requiring more diuresis. hyperglycemia due to steroids, may need treatment at discharge  stablize medically for discharge  volume mgt, pain control, ID mgt, electrolyte mgt, renal mgt   Bowel/Bladder   continent B&B; LBM 7/14, scheduled Senokot  remain continent of B&B   assess B&B qshift and PRN   Swallow/Nutrition/ Hydration             ADL's   min assist UB dressing, min-mod assist LB dressing, mod-max assist toileting tasks, min assit stand pivot transfers with RW.    Min-mod assist  ADL retraining, toileting tasks, pt/family education, d/c planning   Mobility   minA bed mobility for BLE management, min/modA sit to/from stand, minA stand pivot transfers, gait up to 22 ft with RW  Min assist trasnfers to and from [facility]. supervision assist WC mobility. min assist bed mobility.   endurance, BLE strengthening, gait    Communication             Safety/Cognition/ Behavioral Observations  Pain   Pain R hip, Back; scheduled Tylenol 650mg  TID; Neurontin 100mg  TID; Lidoderm to R hip daily; Oxycodone 5mg  BID; Zanaflex daily; PRN Oxycodone 5-10mg  q6h, Tramadol 50mg  q6h  pain <=3/10  assess pain q shift and PRN   Skin   bruises to abdomen  skin free of breakdown/infection  assess skin q shift and PRN    Rehab Goals Patient on target to meet rehab goals: Yes Rehab Goals Revised: Excellent progress this past week. *See Care Plan and progress notes for long and short-term goals.     Barriers to Discharge  Current Status/Progress Possible Resolutions Date Resolved   Physician    Medical stability        see  above      Nursing                  PT                    OT                  SLP                SW                Discharge Planning/Teaching Needs:  Pt to d/c home with daughter-in-law, [name], to be primary caregiver.  [employer] to cover 24/7 assistance.  Teaching being completed with dtr-in-law this week.   Team Discussion:  Medically stabilizing;  Cont b/b.  Improved overall with PT/OT and at min assist levels.  Actually exceeded original goals.  Fatigue/ motivation can affect performance at times.  Family ed underway.  Revisions to Treatment Plan:  None    Continued Need for Acute Rehabilitation Level of Care: The patient requires daily medical management by a physician with specialized training in physical medicine and rehabilitation for the following conditions: Daily direction of a multidisciplinary physical rehabilitation program to ensure safe treatment while eliciting the highest outcome that is of practical value to the patient.: Yes Daily medical management of patient stability for increased activity during participation in an intensive rehabilitation regime.: Yes Daily analysis of laboratory values and/or radiology reports with any subsequent need for medication adjustment of medical intervention for : Neurological problems;Renal problems;Cardiac problems;Pulmonary problems  [name], [employer] 06/19/2018, 10:24 AM

## 2018-06-19 NOTE — Progress Notes (Signed)
Occupational Therapy Weekly Progress Note  Patient Details  Name: Erika Fry MRN: [id number] Date of Birth: 08/07/1946  Beginning of progress report period: June 10, 2018 End of progress report period: June 19, 2018  Today's Date: 06/19/2018 OT Individual Time: 0930-1030 OT Individual Time Calculation (min): 60 min    Short term goals not set due to estimated length of stay.  Pt stay extended due to medical issues with BLE edema with fluid retention.  Pt is making steady progress towards goals.  Pt requires min-mod assist sit > stand depending on pain level in Rt hip and fatigue.  Pt has demonstrated great improvements in self-care tasks with ability to complete LB bathing and dressing with AE at overall min assist level.  Pt will benefit from continued OT services to continue to address endurance and mobility during self-care tasks until medically ready for d/c.  Patient continues to demonstrate the following deficits: muscle weakness and muscle joint tightness, decreased cardiorespiratoy endurance and decreased oxygen support, decreased memory and decreased standing balance and pain and therefore will continue to benefit from skilled OT intervention to enhance overall performance with BADL and Reduce care partner burden.  See Patient's Care Plan for progression toward long term goals.  Patient progressing toward long term goals..  Continue plan of care.  Skilled Therapeutic Interventions/Progress Updates:    Treatment session with focus on activity tolerance and endurance to decrease burden of care with self-care tasks.  Pt received upright in w/c reporting already dressed and declining any bathing and dressing this session.  Pt reports extreme pain in Rt hip and reports already receiving pain meds this AM.  Per pt, pt to stay through weekend to continue diuresis.  Engaged in sit > stand x3 with pt calling out in pain during transitional movement and when attempting to weight shift  through RLE.  Engaged in table top task between standing to continue to promote weight shifting and reaching to facilitate shifting weight over Rt hip.  Engaged in [place] with 2# dowel rod with pt compelte 3 sets of 10 chest presses, bicep curls, and overhead presses for endurance and strengthening.  Applied heat for 20 mins to Rt hip (during activity) with pt reporting pain subsiding while applied but once removed pain back up to 8/10.  RN notified of pain and left seated upright in w/c with all needs in reach.  Therapy Documentation Precautions:  Precautions Precautions: Fall Precaution Comments: Pacemaker placement 4 weeks ago Required Braces or Orthoses: Spinal Brace Spinal Brace: Lumbar corset Restrictions Weight Bearing Restrictions: No Pain:  Pt reports pain 8/10 in Rt hip at rest and 10/10 in standing.  RN aware, premedicated.  Applied heat for 20 mins with minimal reduction in pain.  See Function Navigator for Current Functional Status.   Therapy/Group: Individual Therapy  Simonne Come 06/19/2018, 10:17 AM

## 2018-06-19 NOTE — Progress Notes (Signed)
Social Work  Discharge Note  The overall goal for the admission was met for:   Discharge location: Yes - home with dtr-in-law, Amber, to provide primary caregiver assistance.  Length of Stay: No - extended a few days due to medical issues.  Total LOS = 31 days (with discharge on 06/22/18)  Discharge activity level: Yes - min assist overall  Home/community participation: Yes  Services provided included: MD, RD, PT, OT, RN, TR, Pharmacy, [place]: Medicare  Follow-up services arranged: Home Health: RN, PT, OT via [facility] and IV abx for home supplied by [facility], DME: 20x18 lightweight w/c with ELRs, cushion, wide rw, wide drop arm commode, tub transfer bench and hospital bed via [place] and Patient/Family has no preference for HH/DME agencies  Comments (or additional information):  Patient/Family verbalized understanding of follow-up arrangements: Yes  Individual responsible for coordination of the follow-up plan: pt  Confirmed correct DME delivered: HOYLE, LUCY 06/19/2018    HOYLE, LUCY

## 2018-06-20 ENCOUNTER — Inpatient Hospital Stay (HOSPITAL_COMMUNITY): Payer: Medicare Other

## 2018-06-20 ENCOUNTER — Inpatient Hospital Stay (HOSPITAL_COMMUNITY): Payer: Medicare Other | Admitting: Physical Therapy

## 2018-06-20 LAB — CBC
HEMATOCRIT: 25.9 % — AB (ref 36.0–46.0)
Hemoglobin: 8.3 g/dL — ABNORMAL LOW (ref 12.0–15.0)
MCH: 29.7 pg (ref 26.0–34.0)
MCHC: 32 g/dL (ref 30.0–36.0)
MCV: 92.8 fL (ref 78.0–100.0)
Platelets: 187 10*3/uL (ref 150–400)
RBC: 2.79 MIL/uL — ABNORMAL LOW (ref 3.87–5.11)
RDW: 18 % — AB (ref 11.5–15.5)
WBC: 9.5 10*3/uL (ref 4.0–10.5)

## 2018-06-20 LAB — GLUCOSE, CAPILLARY
GLUCOSE-CAPILLARY: 177 mg/dL — AB (ref 70–99)
GLUCOSE-CAPILLARY: 162 mg/dL — AB (ref 70–99)
Glucose-Capillary: 74 mg/dL (ref 70–99)
Glucose-Capillary: 130 mg/dL — ABNORMAL HIGH (ref 70–99)

## 2018-06-20 LAB — BASIC METABOLIC PANEL
Anion gap: 12 (ref 5–15)
BUN: 83 mg/dL — AB (ref 8–23)
CHLORIDE: 96 mmol/L — AB (ref 98–111)
CO2: 30 mmol/L (ref 22–32)
Calcium: 9.1 mg/dL (ref 8.9–10.3)
Creatinine, Ser: 2.18 mg/dL — ABNORMAL HIGH (ref 0.44–1.00)
GFR calc Af Amer: 24 mL/min — ABNORMAL LOW (ref >60)
GFR calc non Af Amer: 21 mL/min — ABNORMAL LOW (ref >60)
GLUCOSE: 87 mg/dL (ref 70–99)
POTASSIUM: 3.5 mmol/L (ref 3.5–5.1)
Sodium: 138 mmol/L (ref 135–145)

## 2018-06-20 MED ORDER — PREMIER PROTEIN SHAKE
414.0000 mL | Freq: Three times a day (TID) | ORAL | Status: DC
  Filled 2018-06-20: qty 650.62

## 2018-06-20 MED ORDER — POTASSIUM CHLORIDE CRYS ER 20 MEQ PO TBCR
20.0000 meq | EXTENDED_RELEASE_TABLET | Freq: Every day | ORAL | Status: DC
  Administered 2018-06-20 – 2018-06-22 (×3): 20 meq via ORAL
  Filled 2018-06-20 (×3): qty 1

## 2018-06-20 MED ORDER — PREMIER PROTEIN SHAKE
11.0000 [oz_av] | Freq: Three times a day (TID) | ORAL | Status: DC
  Administered 2018-06-20 – 2018-06-22 (×5): 11 [oz_av] via ORAL
  Filled 2018-06-20 (×8): qty 325.31

## 2018-06-20 NOTE — Progress Notes (Signed)
Subjective/Complaints: Vision had a good morning today.  Participated well with therapy  ROS: Patient deniesnausea, vomiting, diarrhea, cough, shortness of breath or chest pain, joint or back pain, headache  Objective: Vital Signs: Blood pressure 125/67, pulse 73, temperature 98.4 F (36.9 C), temperature source Oral, resp. rate 20, height 5' 5"  (1.651 m), weight 94.2 kg (207 lb 10.8 oz), SpO2 96 %. No results found. Results for orders placed or performed during the hospital encounter of 05/22/18 (from the past 72 hour(s))  Glucose, capillary     Status: Abnormal   Collection Time: 06/17/18 11:45 AM  Result Value Ref Range   Glucose-Capillary 170 (H) 70 - 99 mg/dL   Comment 1 Notify RN   Glucose, capillary     Status: Abnormal   Collection Time: 06/17/18  4:43 PM  Result Value Ref Range   Glucose-Capillary 215 (H) 70 - 99 mg/dL   Comment 1 Notify RN   Glucose, capillary     Status: Abnormal   Collection Time: 06/17/18  9:37 PM  Result Value Ref Range   Glucose-Capillary 253 (H) 70 - 99 mg/dL  Basic metabolic panel     Status: Abnormal   Collection Time: 06/18/18  4:55 AM  Result Value Ref Range   Sodium 135 135 - 145 mmol/L   Potassium 4.2 3.5 - 5.1 mmol/L   Chloride 98 98 - 111 mmol/L    Comment: Please note change in reference range.   CO2 28 22 - 32 mmol/L   Glucose, Bld 114 (H) 70 - 99 mg/dL    Comment: Please note change in reference range.   BUN 83 (H) 8 - 23 mg/dL    Comment: Please note change in reference range.   Creatinine, Ser 2.09 (H) 0.44 - 1.00 mg/dL   Calcium 9.2 8.9 - 10.3 mg/dL   GFR calc non Af Amer 22 (L) >60 mL/min   GFR calc Af Amer 26 (L) >60 mL/min    Comment: (NOTE) The eGFR has been calculated using the CKD EPI equation. This calculation has not been validated in all clinical situations. eGFR's persistently <60 mL/min signify possible Chronic Kidney Disease.    Anion gap 9 5 - 15    Comment: Performed at [facility] [address], [place], [place] [address]  CBC with Differential/Platelet     Status: Abnormal   Collection Time: 06/18/18  4:55 AM  Result Value Ref Range   WBC 9.1 4.0 - 10.5 K/uL   RBC 2.73 (L) 3.87 - 5.11 MIL/uL   Hemoglobin 8.0 (L) 12.0 - 15.0 g/dL   HCT 25.4 (L) 36.0 - 46.0 %   MCV 93.0 78.0 - 100.0 fL   MCH 29.3 26.0 - 34.0 pg   MCHC 31.5 30.0 - 36.0 g/dL   RDW 17.5 (H) 11.5 - 15.5 %   Platelets 196 150 - 400 K/uL   Neutrophils Relative % 85 %   Neutro Abs 7.7 1.7 - 7.7 K/uL   Lymphocytes Relative 9 %   Lymphs Abs 0.8 0.7 - 4.0 K/uL   Monocytes Relative 5 %   Monocytes Absolute 0.5 0.1 - 1.0 K/uL   Eosinophils Relative 0 %   Eosinophils Absolute 0.0 0.0 - 0.7 K/uL   Basophils Relative 0 %   Basophils Absolute 0.0 0.0 - 0.1 K/uL   Immature Granulocytes 1 %   Abs Immature Granulocytes 0.1 0.0 - 0.1 K/uL    Comment: Performed at [facility], [address],  [place], [place] [address]  Glucose, capillary     Status: None   Collection Time: 06/18/18  6:54 AM  Result Value Ref Range   Glucose-Capillary 85 70 - 99 mg/dL  Glucose, capillary     Status: None   Collection Time: 06/18/18 12:01 PM  Result Value Ref Range   Glucose-Capillary 85 70 - 99 mg/dL  Glucose, capillary     Status: Abnormal   Collection Time: 06/18/18  4:46 PM  Result Value Ref Range   Glucose-Capillary 126 (H) 70 - 99 mg/dL   Comment 1 Notify RN   Glucose, capillary     Status: Abnormal   Collection Time: 06/18/18  9:11 PM  Result Value Ref Range   Glucose-Capillary 138 (H) 70 - 99 mg/dL  Renal function panel     Status: Abnormal   Collection Time: 06/19/18  4:30 AM  Result Value Ref Range   Sodium 136 135 - 145 mmol/L   Potassium 3.8 3.5 - 5.1 mmol/L   Chloride 95 (L) 98 - 111 mmol/L    Comment: Please note change in reference range.   CO2 29 22 - 32 mmol/L   Glucose, Bld 80 70 - 99 mg/dL    Comment: Please note change in reference range.   BUN 86 (H) 8 - 23 mg/dL    Comment: Please note change in  reference range.   Creatinine, Ser 2.28 (H) 0.44 - 1.00 mg/dL   Calcium 9.1 8.9 - 10.3 mg/dL   Phosphorus 4.0 2.5 - 4.6 mg/dL   Albumin 2.6 (L) 3.5 - 5.0 g/dL   GFR calc non Af Amer 20 (L) >60 mL/min   GFR calc Af Amer 23 (L) >60 mL/min    Comment: (NOTE) The eGFR has been calculated using the CKD EPI equation. This calculation has not been validated in all clinical situations. eGFR's persistently <60 mL/min signify possible Chronic Kidney Disease.    Anion gap 12 5 - 15    Comment: Performed at [facility] [address], [place], [place] [address]  CBC     Status: Abnormal   Collection Time: 06/19/18  4:30 AM  Result Value Ref Range   WBC 10.0 4.0 - 10.5 K/uL   RBC 2.83 (L) 3.87 - 5.11 MIL/uL   Hemoglobin 8.3 (L) 12.0 - 15.0 g/dL   HCT 26.3 (L) 36.0 - 46.0 %   MCV 92.9 78.0 - 100.0 fL   MCH 29.3 26.0 - 34.0 pg   MCHC 31.6 30.0 - 36.0 g/dL   RDW 18.0 (H) 11.5 - 15.5 %   Platelets 202 150 - 400 K/uL    Comment: Performed at [facility], [place] [address], [place], [place] [address]  Glucose, capillary     Status: None   Collection Time: 06/19/18  6:44 AM  Result Value Ref Range   Glucose-Capillary 76 70 - 99 mg/dL  Glucose, capillary     Status: Abnormal   Collection Time: 06/19/18 11:59 AM  Result Value Ref Range   Glucose-Capillary 103 (H) 70 - 99 mg/dL  Glucose, capillary     Status: Abnormal   Collection Time: 06/19/18  5:15 PM  Result Value Ref Range   Glucose-Capillary 175 (H) 70 - 99 mg/dL  Glucose, capillary     Status: Abnormal   Collection Time: 06/19/18 10:08 PM  Result Value Ref Range   Glucose-Capillary 190 (H) 70 - 99 mg/dL  Basic metabolic panel     Status: Abnormal   Collection  Time: 06/20/18  3:59 AM  Result Value Ref Range   Sodium 138 135 - 145 mmol/L   Potassium 3.5 3.5 - 5.1 mmol/L   Chloride 96 (L) 98 - 111 mmol/L    Comment: Please note change in reference range.   CO2 30 22 - 32 mmol/L   Glucose, Bld 87 70 - 99 mg/dL    Comment:  Please note change in reference range.   BUN 83 (H) 8 - 23 mg/dL    Comment: Please note change in reference range.   Creatinine, Ser 2.18 (H) 0.44 - 1.00 mg/dL   Calcium 9.1 8.9 - 10.3 mg/dL   GFR calc non Af Amer 21 (L) >60 mL/min   GFR calc Af Amer 24 (L) >60 mL/min    Comment: (NOTE) The eGFR has been calculated using the CKD EPI equation. This calculation has not been validated in all clinical situations. eGFR's persistently <60 mL/min signify possible Chronic Kidney Disease.    Anion gap 12 5 - 15    Comment: Performed at [facility] [address], [place], [place] [address]  CBC     Status: Abnormal   Collection Time: 06/20/18  3:59 AM  Result Value Ref Range   WBC 9.5 4.0 - 10.5 K/uL   RBC 2.79 (L) 3.87 - 5.11 MIL/uL   Hemoglobin 8.3 (L) 12.0 - 15.0 g/dL   HCT 25.9 (L) 36.0 - 46.0 %   MCV 92.8 78.0 - 100.0 fL   MCH 29.7 26.0 - 34.0 pg   MCHC 32.0 30.0 - 36.0 g/dL   RDW 18.0 (H) 11.5 - 15.5 %   Platelets 187 150 - 400 K/uL    Comment: Performed at [facility], [place] [address] [place], [place] [address]  Glucose, capillary     Status: None   Collection Time: 06/20/18  6:31 AM  Result Value Ref Range   Glucose-Capillary 74 70 - 99 mg/dL     Constitutional: No distress . Vital signs reviewed. HEENT: EOMI, oral membranes moist Neck: supple Cardiovascular: RRR without murmur. No JVD    Respiratory: CTA Bilaterally without wheezes or rales. Normal effort    GI: BS +, non-tender, non-distended  Musc: edema decreased but still 1+ bilateral LE Neuro: Alert Motor:  Bilateral upper extremities: 4+/5 proximal distal (stable) Bilateral lower extremities: Hip flexion 3+/5, knee extension 4-/5, ankle dorsiflexion 5/5 (stable) Skin: Warm and dry. Intact. Psych: Normal mood.  Normal behavior.  Assessment/Plan: 1. Functional deficits secondary to L4-5 Lumbar discitis which require 3+ hours per day of interdisciplinary therapy in a comprehensive inpatient rehab  setting. Physiatrist is providing close team supervision and 24 hour management of active medical problems listed below. Physiatrist and rehab team continue to assess barriers to discharge/monitor patient progress toward functional and medical goals. FIM: Function - Bathing Bathing activity did not occur: Refused Position: Sitting EOB Body parts bathed by patient: Right arm, Left arm, Chest, Abdomen, Front perineal area, Buttocks, Right upper leg, Left upper leg, Right lower leg, Left lower leg Body parts bathed by helper: Back Assist Level: Touching or steadying assistance(Pt > 75%)  Function- Upper Body Dressing/Undressing Upper body dressing/undressing activity did not occur: Refused What is the patient wearing?: Pull over shirt/dress, Orthosis Pull over shirt/dress - Perfomed by patient: Thread/unthread right sleeve, Thread/unthread left sleeve, Pull shirt over trunk, Put head through opening Pull over shirt/dress - Perfomed by helper: Put head through opening Orthosis activity level: Performed by helper Assist Level: Set up  Set up : To obtain clothing/put away, To apply TLSO, cervical collar Function - Lower Body Dressing/Undressing Lower body dressing/undressing activity did not occur: Refused What is the patient wearing?: Pants, Non-skid slipper socks Position: Wheelchair/chair at sink Pants- Performed by patient: Thread/unthread right pants leg, Thread/unthread left pants leg, Pull pants up/down Pants- Performed by helper: Pull pants up/down Non-skid slipper socks- Performed by patient: Don/doff right sock, Don/doff left sock Non-skid slipper socks- Performed by helper: Don/doff right sock, Don/doff left sock Shoes - Performed by patient: Don/doff left shoe Shoes - Performed by helper: Don/doff right shoe TED Hose - Performed by helper: Don/doff right TED hose, Don/doff left TED hose Assist for footwear: Supervision/touching assist Assist for lower body dressing: Touching or  steadying assistance (Pt > 75%)  Function - Toileting Toileting activity did not occur: Safety/medical concerns Toileting steps completed by patient: Adjust clothing prior to toileting, Adjust clothing after toileting Toileting steps completed by helper: Performs perineal hygiene Toileting Assistive Devices: Grab bar or rail Assist level: Touching or steadying assistance (Pt.75%)  Function - Air cabin crew transfer activity did not occur: Safety/medical concerns Toilet transfer assistive device: Bedside commode, Environmental manager lift: Stedy Assist level to toilet: Touching or steadying assistance (Pt > 75%) Assist level from toilet: Touching or steadying assistance (Pt > 75%) Assist level to bedside commode (at bedside): Touching or steadying assistance (Pt > 75%) Assist level from bedside commode (at bedside): Touching or steadying assistance (Pt > 75%)  Function - Chair/bed transfer Chair/bed transfer activity did not occur: N/A Chair/bed transfer method: Stand pivot Chair/bed transfer assist level: Touching or steadying assistance (Pt > 75%) Chair/bed transfer assistive device: Armrests, Walker Mechanical lift: Stedy Chair/bed transfer details: Manual facilitation for weight shifting  Function - Locomotion: Wheelchair Type: Manual Wheelchair activity did not occur: Refused Max wheelchair distance: 140f  Assist Level: Supervision or verbal cues Wheel 50 feet with 2 turns activity did not occur: Refused Assist Level: Supervision or verbal cues Wheel 150 feet activity did not occur: Refused Assist Level: Supervision or verbal cues Turns around,maneuvers to table,bed, and toilet,negotiates 3% grade,maneuvers on rugs and over doorsills: No Function - Locomotion: Ambulation Ambulation activity did not occur: Safety/medical concerns Assistive device: Walker-rolling Max distance: 50 Assist level: Touching or steadying assistance (Pt > 75%) Walk 10 feet activity did not  occur: Safety/medical concerns Assist level: Touching or steadying assistance (Pt > 75%) Walk 50 feet with 2 turns activity did not occur: Safety/medical concerns Assist level: Touching or steadying assistance (Pt > 75%) Walk 150 feet activity did not occur: Safety/medical concerns Walk 10 feet on uneven surfaces activity did not occur: Safety/medical concerns  Function - Comprehension Comprehension: Auditory Comprehension assist level: Follows complex conversation/direction with no assist  Function - Expression Expression: Verbal Expression assist level: Expresses complex ideas: With no assist  Function - Social Interaction Social Interaction assist level: Interacts appropriately with others with medication or extra time (anti-anxiety, antidepressant).  Function - Problem Solving Problem solving assist level: Solves complex problems: Recognizes & self-corrects  Function - Memory Memory assist level: Recognizes or recalls 75 - 89% of the time/requires cueing 10 - 24% of the time Patient normally able to recall (first 3 days only): Current season, Location of own room, Staff names and faces, That he or she is in a hospital  Medical Problem List and Plan:  1. Functional deficits secondary to L4-5 osteomyelitis/discitis, lumbar spondylosis   Continue CIR  -extend stay through weekend for diuresis 2. DVT Prophylaxis/Anticoagulation: Mechanical: Sequential  compression devices, below knee Bilateral lower extremities   Doppler negative for DVT in LE  LUE doppler ordered to evaluate for DVT, negative 3. Pain Management: Scheduled oxycodone prior to therapy to help with activity tolerance, decreased to 5 mg on 6/27.   Continue Ultram prn for moderate pain. Monitor and titrate medications to help with neuropathic symptoms. Decadron d/ced.   RLE radicular pain, partially improved on gabapentin to 676m BID, decreased to 300 mg twice a day, DC'd on 6/27 due to lethargy and tremors. Gabapentin  100 3 times a day started on/3   Low dose dexamethasone DC'd on 6/26  Baclofen changed to 5 mg daily at bedtime, patient unable to tolerate higher doses, DC'd on 6/27 due to lethargy  Lidoderm patch added on 7/2  Tizanidine 2 mg daily started on 7/11, will attempt to minimize medications as much as possible due to sensitivity and sedation  Pain controlled on 7/18 4. Mood: LCSW to follow for evaluation and support.  5. Neuropsych: This patient is capable of making decisions on her own behalf.  6. Skin/Wound Care: routine pressure relief measures  7. Fluids/Electrolytes/Nutrition: Monitor I/O.  8. Lumbar diskitis: Afebrile on scheduled tylenol. IV Abx per ID.  9. Acute on chronic anemia: Treated with iron and procrit on outpatient basis. Baseline Hgb- 9-10 range?   Hemoglobin 8.0 7/18--->8.3 7.19  -aranesp resumed   -no active blood loss, some increase from diuresis  -Hemoglobin stable at 8.3 on 06/20/2018 10. Symptomatic Bradycardia s/p PPM: Monitor HR bid.   Resolved 11. GIB with thrombocytopenia: Resolved  Cont to monitor 12. New diagnosis T2DM: Hgb A1c- 6.3. Poorly controlled due to recent rounds of steroids--Lantus was titrated upwards for better control. Was not on any medications at home. Monitor BS ac/hs--watch for hypoglycemic episodes as steroids tapered off.  CBG (last 3)  Recent Labs    06/19/18 1715 06/19/18 2208 06/20/18 0631  GLUCAP 175* 190* 74       -Prednisone 5 mg twice daily today then discontinue  -initiated 16mamaryl daily on 7/17 ---sugars now too low---will dc amaryl 13. Neuropathy BLE: Continue Gabapentin bid--monitor for SE.  14. Hyponatremia:   Sodium 135 on 7/18  Added protein supplements with meals. monitor for now.   Continue to monitor 15. Stage IV CKD: Baseline SCr 3.5-4.0. Monitor for SE/asterixis with narcotics.   Creatinine 2.09  Continue to monitor with increased lasix 16. CHF  Fluid overloaded after IVF and decrease in diuretics   Lasix  increased to 8036mV BID---back to 95m93m bid per nephro  -weight,edema decreased  -nephrology was asked to follow up with patient, [facility]   06/18/18 0515 06/19/18 0500 06/20/18 0500  Weight: 100.4 kg (221 lb 5.5 oz) 95.3 kg (210 lb 1.6 oz) 94.2 kg (207 lb 10.8 oz)   -daily labs 17. Morbid obesity  Encourage weight loss 18. Essential hypertension  Continue Norvasc 10 mg daily  Hydralazine 50 mg 3 times a day, increased to 75 on 6/24   Vitals:   06/19/18 2249 06/20/18 0538  BP: 128/62 125/67  Pulse: 64 73  Resp: 18 20  Temp:  98.4 F (36.9 C)  SpO2: 97% 96%  Blood pressure well controlled 06/20/2018 19. Hypoalbuminemia  Supplement initiated 20. Hypokalemia, improved  Potassium 4.1 7/18, 3.5 on 06/20/2018  Continue to monitor 21. AMS: Resolved  Likely medication induced  CXR reviewed, ECG reviewed, head CT reviewed, EEG performed - ? Borderline QTC prolongation  Discussed with pharmacy  TSH, free T4 within acceptable range  Free T3 low  Continues to have low grade fever  UA unremarkable.  Urine culture no growth  Blood cultures no growth 22. Hypothyroidism  Continue meds   See #21 23.  Sleep disturbance  Melatonin initiated on 6/30, increased on 7/1  Improved 24. 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

## 2018-06-20 NOTE — Progress Notes (Signed)
Assessment:  1CKD 4 2Volume excess 3 Lumbar discitis Plan: 1Cont. PO diuretic dose. K supp  Subjective: Interval History: Tolerating PO furosemide   Objective: Vital signs in last 24 hours: Temp:  [98.1 F (36.7 C)-98.4 F (36.9 C)] 98.2 F (36.8 C) (07/20 1451) Pulse Rate:  [64-78] 78 (07/20 1451) Resp:  [17-20] 17 (07/20 1451) BP: (123-128)/(62-67) 123/63 (07/20 1451) SpO2:  [96 %-98 %] 98 % (07/20 1451) Weight:  [94.2 kg (207 lb 10.8 oz)] 94.2 kg (207 lb 10.8 oz) (07/20 0500) Weight change: -1.1 kg (-2 lb 6.8 oz)  Intake/Output from previous day: 07/19 0701 - 07/20 0700 In: 1340 [P.O.:1320; I.V.:20] Out: 2350 [Urine:2350] Intake/Output this shift: Total I/O In: 240 [P.O.:240] Out: 950 [Urine:950]  General appearance: alert and cooperative Resp: clear to auscultation bilaterally, upper back Extremities: edema 2+  Lab Results: Recent Labs    06/19/18 0430 06/20/18 0359  WBC 10.0 9.5  HGB 8.3* 8.3*  HCT 26.3* 25.9*  PLT 202 187   BMET:  Recent Labs    06/19/18 0430 06/20/18 0359  NA 136 138  K 3.8 3.5  CL 95* 96*  CO2 29 30  GLUCOSE 80 87  BUN 86* 83*  CREATININE 2.28* 2.18*  CALCIUM 9.1 9.1   No results for input(s): PTH in the last 72 hours. Iron Studies: No results for input(s): IRON, TIBC, TRANSFERRIN, FERRITIN in the last 72 hours. Studies/Results: No results found.  Scheduled: . acetaminophen  650 mg Oral TID AC & HS  . allopurinol  200 mg Oral Daily  . amLODipine  10 mg Oral Daily  . Chlorhexidine Gluconate Cloth  6 each Topical Q0600  . [START ON 06/26/2018] darbepoetin (ARANESP) injection - NON-DIALYSIS  100 mcg Subcutaneous Q28 days  . ferrous sulfate  325 mg Oral BID WC  . folic acid  1 mg Oral Daily  . furosemide  80 mg Oral BID  . hydrALAZINE  75 mg Oral TID  . insulin aspart  0-5 Units Subcutaneous QHS  . insulin aspart  0-9 Units Subcutaneous TID WC  . insulin glargine  20 Units Subcutaneous Daily  . levothyroxine  75  mcg Oral QAC breakfast  . lidocaine  1 patch Transdermal Q24H  . linezolid  600 mg Oral Q12H  . loratadine  10 mg Oral Daily  . Melatonin  1.5 mg Oral QHS  . multivitamin with minerals   Oral Daily  . oxyCODONE  5 mg Oral BID WC  . pantoprazole  40 mg Oral Q0600  . potassium chloride  20 mEq Oral Daily  . predniSONE  5 mg Oral Q breakfast  . protein supplement shake  11 oz Oral TID  . senna  1 tablet Oral Daily  . simvastatin  40 mg Oral QHS  . tiZANidine  2 mg Oral Daily  . vitamin B-12  1,000 mcg Oral Daily     LOS: 29 days   Estanislado Emms 06/20/2018,5:52 PM

## 2018-06-20 NOTE — Progress Notes (Signed)
Physical Therapy Session Note  Patient Details  Name: Erika Fry MRN: [id number] Date of Birth: July 11, 1946  Today's Date: 06/20/2018 PT Individual Time: 1005-1055 PT Individual Time Calculation (min): 50 min   Skilled Therapeutic Interventions/Progress Updates:    Therapy session with pt in bed.  Pt denies pain.  Vitals:  BP (in R UE): 124/69; HR: 85 bpm. Pt denies pain.  Pt donned shorts in supine with max A and then up to sitting for pulling shorts up.  Donned LSO in sitting with total A.  Session today focused on improving motor planning with sit to stand and endurance with ambulation.  Pt ambulated 2 x 50 with RW and CGA.  Pt started session "rocking" x 3 to get up out of chair.  Therapist and pt discussed working on decreasing rocking to take stress off of back and with elevated mat, pt was able to perform blocked practice of 2 x 3 to RW of sit to stand with CGA.  Following session, pt still 0/10.  Following session, pt left up in chair with needs met, call bell/phone in place.  Therapy Documentation Precautions:  Precautions Precautions: Fall Precaution Comments: Pacemaker placement 4 weeks ago Required Braces or Orthoses: Spinal Brace Spinal Brace: Lumbar corset Restrictions Weight Bearing Restrictions: No   See Function Navigator for Current Functional Status.   Therapy/Group: Individual Therapy  Mohsin Crum Hilario Quarry 06/20/2018, 12:24 PM

## 2018-06-20 NOTE — Progress Notes (Signed)
Occupational Therapy Session Note  Patient Details  Name: Erika Fry MRN: [id number] Date of Birth: January 31, 1946  Today's Date: 06/20/2018 OT Individual Time: 1300-1345 OT Individual Time Calculation (min): 45 min    Short Term Goals: Week 1:  OT Short Term Goal 1 (Week 1): Pt will report no more than 5/10 pain in order to participate more functionally in ADLs OT Short Term Goal 1 - Progress (Week 1): Not met OT Short Term Goal 2 (Week 1): Pt will sit EOB for >10 min with no more than mod A to participate in morning ADL routine OT Short Term Goal 2 - Progress (Week 1): Not met OT Short Term Goal 3 (Week 1): Pt will don shirt with min A  OT Short Term Goal 3 - Progress (Week 1): Not met OT Short Term Goal 4 (Week 1): Pt will adhere to back precautions during bed mobility with no more than 2 vc  OT Short Term Goal 4 - Progress (Week 1): Met  Skilled Therapeutic Interventions/Progress Updates:    1:1. Pt completes transfers w/c>BSC/TTB with RW with supervision for [facility] transfer and CGA for shower stall/TTB transfer and cueing for safety awareness/trunk flexion. Pt able to manage clothing at sit to stand level at toilet with supervision. Pt propels w/c to/from ADL apartment with increased time and rest breaks d/t decreased endurance. Exited session with pt seated in w/c, call light in reach and all needs met.   Therapy Documentation Precautions:  Precautions Precautions: Fall Precaution Comments: Pacemaker placement 4 weeks ago Required Braces or Orthoses: Spinal Brace Spinal Brace: Lumbar corset Restrictions Weight Bearing Restrictions: No General:   Vital Signs:    See Function Navigator for Current Functional Status.   Therapy/Group: Individual Therapy  Tonny Branch 06/20/2018, 1:48 PM

## 2018-06-20 NOTE — Progress Notes (Signed)
Occupational Therapy Session Note  Patient Details  Name: Erika Fry MRN: [id number] Date of Birth: 21-May-1946  Today's Date: 06/20/2018 OT Individual Time: 1400-1430 OT Individual Time Calculation (min): 30 min    Short Term Goals: Week 3:  OT Short Term Goal 1 (Week 3): STGs = LTGs (LTGs downgraded 06/10/18)  Skilled Therapeutic Interventions/Progress Updates:    Pt received supine in bed agreeable to therapy but requesting to not leave bed. Session focused on B UE strength/conditioning. Pt transitioned to EOB with (S) and sat unsupported. Pt completed exercises with moderate intensity weights (70% one RM), requiring frequent vc for technique and encouragement. Exercises focused on triceps, scapular retraction, and functional reaching to simulate IADL tasks. Pt in bed at end of session with family present and all needs met.   Therapy Documentation Precautions:  Precautions Precautions: Fall Precaution Comments: Pacemaker placement 4 weeks ago Required Braces or Orthoses: Spinal Brace Spinal Brace: Lumbar corset Restrictions Weight Bearing Restrictions: No  Vital Signs: Therapy Vitals Temp: 98.2 F (36.8 C) Pulse Rate: 78 Resp: 17 BP: 123/63 Patient Position (if appropriate): Lying Oxygen Therapy SpO2: 98 % O2 Device: Room Air Pain: Pain Assessment Pain Scale: 0-10 Pain Score: 0-No pain ADL: ADL ADL Comments: See functional navigator  See Function Navigator for Current Functional Status.   Therapy/Group: Individual Therapy  Curtis Sites 06/20/2018, 3:09 PM

## 2018-06-21 ENCOUNTER — Inpatient Hospital Stay (HOSPITAL_COMMUNITY): Payer: Medicare Other

## 2018-06-21 ENCOUNTER — Inpatient Hospital Stay (HOSPITAL_COMMUNITY): Payer: Medicare Other | Admitting: Physical Therapy

## 2018-06-21 LAB — GLUCOSE, CAPILLARY
GLUCOSE-CAPILLARY: 191 mg/dL — AB (ref 70–99)
GLUCOSE-CAPILLARY: 163 mg/dL — AB (ref 70–99)
Glucose-Capillary: 123 mg/dL — ABNORMAL HIGH (ref 70–99)
Glucose-Capillary: 107 mg/dL — ABNORMAL HIGH (ref 70–99)

## 2018-06-21 NOTE — Progress Notes (Signed)
Subjective/Complaints: No new issues overnight, participating in therapy  ROS: Patient deniesnausea, vomiting, diarrhea, cough, shortness of breath or chest pain, joint or back pain, headache  Objective: Vital Signs: Blood pressure 131/69, pulse 97, temperature 98.6 F (37 C), temperature source Oral, resp. rate 18, height _0  (1.651 m), weight 92.9 kg (204 lb 12.9 oz), SpO2 98 %. No results found. Results for orders placed or performed during the hospital encounter of 05/22/18 (from the past 72 hour(s))  Glucose, capillary     Status: None   Collection Time: 06/18/18 12:01 PM  Result Value Ref Range   Glucose-Capillary 85 70 - 99 mg/dL  Glucose, capillary     Status: Abnormal   Collection Time: 06/18/18  4:46 PM  Result Value Ref Range   Glucose-Capillary 126 (H) 70 - 99 mg/dL   Comment 1 Notify RN   Glucose, capillary     Status: Abnormal   Collection Time: 06/18/18  9:11 PM  Result Value Ref Range   Glucose-Capillary 138 (H) 70 - 99 mg/dL  Renal function panel     Status: Abnormal   Collection Time: 06/19/18  4:30 AM  Result Value Ref Range   Sodium 136 135 - 145 mmol/L   Potassium 3.8 3.5 - 5.1 mmol/L   Chloride 95 (L) 98 - 111 mmol/L    Comment: Please note change in reference range.   CO2 29 22 - 32 mmol/L   Glucose, Bld 80 70 - 99 mg/dL    Comment: Please note change in reference range.   BUN 86 (H) 8 - 23 mg/dL    Comment: Please note change in reference range.   Creatinine, Ser 2.28 (H) 0.44 - 1.00 mg/dL   Calcium 9.1 8.9 - 10.3 mg/dL   Phosphorus 4.0 2.5 - 4.6 mg/dL   Albumin 2.6 (L) 3.5 - 5.0 g/dL   GFR calc non Af Amer 20 (L) >60 mL/min   GFR calc Af Amer 23 (L) >60 mL/min    Comment: (NOTE) The eGFR has been calculated using the CKD EPI equation. This calculation has not been validated in all clinical situations. eGFR's persistently <60 mL/min signify possible Chronic Kidney Disease.    Anion gap 12 5 - 15    Comment: Performed at [facility] [address] [place], [place] [address]  CBC     Status: Abnormal   Collection Time: 06/19/18  4:30 AM  Result Value Ref Range   WBC 10.0 4.0 - 10.5 K/uL   RBC 2.83 (L) 3.87 - 5.11 MIL/uL   Hemoglobin 8.3 (L) 12.0 - 15.0 g/dL   HCT 26.3 (L) 36.0 - 46.0 %   MCV 92.9 78.0 - 100.0 fL   MCH 29.3 26.0 - 34.0 pg   MCHC 31.6 30.0 - 36.0 g/dL   RDW 18.0 (H) 11.5 - 15.5 %   Platelets 202 150 - 400 K/uL    Comment: Performed at [facility], [place] [address] [place], [place] [address]  Glucose, capillary     Status: None   Collection Time: 06/19/18  6:44 AM  Result Value Ref Range   Glucose-Capillary 76 70 - 99 mg/dL  Glucose, capillary     Status: Abnormal   Collection Time: 06/19/18 11:59 AM  Result Value Ref Range   Glucose-Capillary 103 (H) 70 - 99 mg/dL  Glucose, capillary     Status: Abnormal   Collection Time: 06/19/18  5:15 PM  Result Value Ref Range   Glucose-Capillary 175 (  H) 70 - 99 mg/dL  Glucose, capillary     Status: Abnormal   Collection Time: 06/19/18 10:08 PM  Result Value Ref Range   Glucose-Capillary 190 (H) 70 - 99 mg/dL  Basic metabolic panel     Status: Abnormal   Collection Time: 06/20/18  3:59 AM  Result Value Ref Range   Sodium 138 135 - 145 mmol/L   Potassium 3.5 3.5 - 5.1 mmol/L   Chloride 96 (L) 98 - 111 mmol/L    Comment: Please note change in reference range.   CO2 30 22 - 32 mmol/L   Glucose, Bld 87 70 - 99 mg/dL    Comment: Please note change in reference range.   BUN 83 (H) 8 - 23 mg/dL    Comment: Please note change in reference range.   Creatinine, Ser 2.18 (H) 0.44 - 1.00 mg/dL   Calcium 9.1 8.9 - 10.3 mg/dL   GFR calc non Af Amer 21 (L) >60 mL/min   GFR calc Af Amer 24 (L) >60 mL/min    Comment: (NOTE) The eGFR has been calculated using the CKD EPI equation. This calculation has not been validated in all clinical situations. eGFR's persistently <60 mL/min signify possible Chronic Kidney Disease.    Anion gap 12 5 - 15    Comment:  Performed at [facility] [address], [place], [place] [address]  CBC     Status: Abnormal   Collection Time: 06/20/18  3:59 AM  Result Value Ref Range   WBC 9.5 4.0 - 10.5 K/uL   RBC 2.79 (L) 3.87 - 5.11 MIL/uL   Hemoglobin 8.3 (L) 12.0 - 15.0 g/dL   HCT 25.9 (L) 36.0 - 46.0 %   MCV 92.8 78.0 - 100.0 fL   MCH 29.7 26.0 - 34.0 pg   MCHC 32.0 30.0 - 36.0 g/dL   RDW 18.0 (H) 11.5 - 15.5 %   Platelets 187 150 - 400 K/uL    Comment: Performed at [facility], [place] [address] [place], [place] [address]  Glucose, capillary     Status: None   Collection Time: 06/20/18  6:31 AM  Result Value Ref Range   Glucose-Capillary 74 70 - 99 mg/dL  Glucose, capillary     Status: Abnormal   Collection Time: 06/20/18 11:41 AM  Result Value Ref Range   Glucose-Capillary 130 (H) 70 - 99 mg/dL  Glucose, capillary     Status: Abnormal   Collection Time: 06/20/18  4:53 PM  Result Value Ref Range   Glucose-Capillary 177 (H) 70 - 99 mg/dL  Glucose, capillary     Status: Abnormal   Collection Time: 06/20/18  9:26 PM  Result Value Ref Range   Glucose-Capillary 162 (H) 70 - 99 mg/dL  Glucose, capillary     Status: Abnormal   Collection Time: 06/21/18  6:16 AM  Result Value Ref Range   Glucose-Capillary 123 (H) 70 - 99 mg/dL     Constitutional: No distress . Vital signs reviewed. HEENT: EOMI, oral membranes moist Neck: supple Cardiovascular: RRR without murmur. No JVD    Respiratory: CTA Bilaterally without wheezes or rales. Normal effort    GI: BS +, non-tender, non-distended  Musc: edema decreased but still 1+ bilateral LE Neuro: Alert Motor:  Bilateral upper extremities: 4+/5 proximal distal (stable) Bilateral lower extremities: Hip flexion 3+/5, knee extension 4-/5, ankle dorsiflexion 5/5 (stable) Skin: Warm and dry. Intact. Psych: Normal mood.  Normal behavior.  Assessment/Plan: 1. Functional deficits secondary  to L4-5 Lumbar discitis which require 3+ hours per day of  interdisciplinary therapy in a comprehensive inpatient rehab setting. Physiatrist is providing close team supervision and 24 hour management of active medical problems listed below. Physiatrist and rehab team continue to assess barriers to discharge/monitor patient progress toward functional and medical goals. FIM: Function - Bathing Bathing activity did not occur: Refused Position: Sitting EOB Body parts bathed by patient: Right arm, Left arm, Chest, Abdomen, Front perineal area, Buttocks, Right upper leg, Left upper leg, Right lower leg, Left lower leg Body parts bathed by helper: Back Assist Level: Touching or steadying assistance(Pt > 75%)  Function- Upper Body Dressing/Undressing Upper body dressing/undressing activity did not occur: Refused What is the patient wearing?: Pull over shirt/dress, Orthosis Pull over shirt/dress - Perfomed by patient: Thread/unthread right sleeve, Thread/unthread left sleeve, Pull shirt over trunk, Put head through opening Pull over shirt/dress - Perfomed by helper: Put head through opening Orthosis activity level: Performed by helper Assist Level: Set up Set up : To obtain clothing/put away, To apply TLSO, cervical collar Function - Lower Body Dressing/Undressing Lower body dressing/undressing activity did not occur: Refused What is the patient wearing?: Pants, Non-skid slipper socks Position: Wheelchair/chair at sink Pants- Performed by patient: Thread/unthread right pants leg, Thread/unthread left pants leg, Pull pants up/down Pants- Performed by helper: Pull pants up/down Non-skid slipper socks- Performed by patient: Don/doff right sock, Don/doff left sock Non-skid slipper socks- Performed by helper: Don/doff right sock, Don/doff left sock Shoes - Performed by patient: Don/doff left shoe Shoes - Performed by helper: Don/doff right shoe TED Hose - Performed by helper: Don/doff right TED hose, Don/doff left TED hose Assist for footwear:  Supervision/touching assist Assist for lower body dressing: Touching or steadying assistance (Pt > 75%)  Function - Toileting Toileting activity did not occur: Safety/medical concerns Toileting steps completed by patient: Adjust clothing prior to toileting, Adjust clothing after toileting Toileting steps completed by helper: Performs perineal hygiene Toileting Assistive Devices: Grab bar or rail Assist level: Supervision or verbal cues  Function - Air cabin crew transfer activity did not occur: Safety/medical concerns Toilet transfer assistive device: Bedside commode, Environmental manager lift: Stedy Assist level to toilet: Touching or steadying assistance (Pt > 75%) Assist level from toilet: Touching or steadying assistance (Pt > 75%) Assist level to bedside commode (at bedside): Supervision or verbal cues Assist level from bedside commode (at bedside): Supervision or verbal cues  Function - Chair/bed transfer Chair/bed transfer activity did not occur: N/A Chair/bed transfer method: Ambulatory Chair/bed transfer assist level: Touching or steadying assistance (Pt > 75%) Chair/bed transfer assistive device: Armrests, Walker Mechanical lift: Stedy Chair/bed transfer details: Manual facilitation for weight bearing  Function - Locomotion: Wheelchair Will patient use wheelchair at discharge?: Yes Type: Manual Wheelchair activity did not occur: Refused Max wheelchair distance: 148f Assist Level: Supervision or verbal cues Wheel 50 feet with 2 turns activity did not occur: Refused Assist Level: Supervision or verbal cues Wheel 150 feet activity did not occur: Refused Assist Level: Supervision or verbal cues Turns around,maneuvers to table,bed, and toilet,negotiates 3% grade,maneuvers on rugs and over doorsills: No Function - Locomotion: Ambulation Ambulation activity did not occur: Safety/medical concerns Assistive device: Walker-rolling Max distance: 675fAssist level:  Touching or steadying assistance (Pt > 75%) Walk 10 feet activity did not occur: Safety/medical concerns Assist level: Touching or steadying assistance (Pt > 75%) Walk 50 feet with 2 turns activity did not occur: Safety/medical concerns Assist level: Touching or steadying assistance (Pt >  75%) Walk 150 feet activity did not occur: Safety/medical concerns Walk 10 feet on uneven surfaces activity did not occur: Safety/medical concerns Assist level: Touching or steadying assistance (Pt > 75%)  Function - Comprehension Comprehension: Auditory Comprehension assist level: Follows complex conversation/direction with no assist  Function - Expression Expression: Verbal Expression assist level: Expresses complex ideas: With no assist  Function - Social Interaction Social Interaction assist level: Interacts appropriately with others with medication or extra time (anti-anxiety, antidepressant).  Function - Problem Solving Problem solving assist level: Solves complex problems: Recognizes & self-corrects  Function - Memory Memory assist level: Recognizes or recalls 75 - 89% of the time/requires cueing 10 - 24% of the time Patient normally able to recall (first 3 days only): Current season, Location of own room, Staff names and faces, That he or she is in a hospital  Medical Problem List and Plan:  1. Functional deficits secondary to L4-5 osteomyelitis/discitis, lumbar spondylosis   Continue CIR  -extend stay through weekend for diuresis, complaints of frequent urination 2. DVT Prophylaxis/Anticoagulation: Mechanical: Sequential compression devices, below knee Bilateral lower extremities   Doppler negative for DVT in LE  LUE doppler ordered to evaluate for DVT, negative 3. Pain Management: Scheduled oxycodone prior to therapy to help with activity tolerance, decreased to 5 mg on 6/27.   Continue Ultram prn for moderate pain. Monitor and titrate medications to help with neuropathic symptoms.  Decadron d/ced.   RLE radicular pain, partially improved on gabapentin to 660m BID, decreased to 300 mg twice a day, DC'd on 6/27 due to lethargy and tremors. Gabapentin 100 3 times a day started on/3   Low dose dexamethasone DC'd on 6/26  Baclofen changed to 5 mg daily at bedtime, patient unable to tolerate higher doses, DC'd on 6/27 due to lethargy  Lidoderm patch added on 7/2  Tizanidine 2 mg daily started on 7/11, will attempt to minimize medications as much as possible due to sensitivity and sedation  Pain controlled on 7/18 4. Mood: LCSW to follow for evaluation and support.  5. Neuropsych: This patient is capable of making decisions on her own behalf.  6. Skin/Wound Care: routine pressure relief measures  7. Fluids/Electrolytes/Nutrition: Monitor I/O.  8. Lumbar diskitis: Afebrile on scheduled tylenol. IV Abx per ID.  9. Acute on chronic anemia: Treated with iron and procrit on outpatient basis. Baseline Hgb- 9-10 range?   Hemoglobin 8.0 7/18--->8.3 7.19  -aranesp resumed   -no active blood loss, some increase from diuresis  -Hemoglobin stable at 8.3 on 06/20/2018 10. Symptomatic Bradycardia s/p PPM: Monitor HR bid.   Resolved 11. GIB with thrombocytopenia: Resolved  Cont to monitor 12. New diagnosis T2DM: Hgb A1c- 6.3. Poorly controlled due to recent rounds of steroids--Lantus was titrated upwards for better control. Was not on any medications at home. Monitor BS ac/hs--watch for hypoglycemic episodes as steroids tapered off.  CBG (last 3)  Recent Labs    06/20/18 1653 06/20/18 2126 06/21/18 0616  GLUCAP 177* 162* 123*       -Off prednisone, continues on Lantus 20 units every morning, monitor for hypoglycemia  -initiated 157mamaryl daily on 7/17 ---sugars now too low---will dc amaryl 13. Neuropathy BLE: Continue Gabapentin bid--monitor for SE.  14. Hyponatremia:   Sodium 135 on 7/18  Added protein supplements with meals. monitor for now.   Continue to monitor 15. Stage IV  CKD: Baseline SCr 3.5-4.0. Monitor for SE/asterixis with narcotics.   Creatinine 2.09  Continue to monitor with increased lasix 16.  CHF  Fluid overloaded after IVF and decrease in diuretics   Lasix increased to 68m IV BID---back to 876mpo bid per nephro  -weight,edema decreased  -nephrology was asked to follow up with patient Filed Weights   06/19/18 0500 06/20/18 0500 06/21/18 0300  Weight: 95.3 kg (210 lb 1.6 oz) 94.2 kg (207 lb 10.8 oz) 92.9 kg (204 lb 12.9 oz)   Weights are coming down again 17. Morbid obesity  Encourage weight loss 18. Essential hypertension  Continue Norvasc 10 mg daily  Hydralazine 50 mg 3 times a day, increased to 75 on 6/24   Vitals:   06/20/18 1451 06/20/18 2014  BP: 123/63 131/69  Pulse: 78 97  Resp: 17 18  Temp: 98.2 F (36.8 C) 98.6 F (37 C)  SpO2: 98% 98%  Blood pressure well controlled 06/21/2018 19. Hypoalbuminemia  Supplement initiated 20. Hypokalemia, improved  Potassium 4.1 7/18, 3.5 on 06/20/2018, recheck in a.m.  Continue to monitor 21. AMS: Resolved  Likely medication induced  CXR reviewed, ECG reviewed, head CT reviewed, EEG performed - ? Borderline QTC prolongation  Discussed with pharmacy  TSH, free T4 within acceptable range  Free T3 low  Continues to have low grade fever  UA unremarkable.  Urine culture no growth  Blood cultures no growth 22. Hypothyroidism  Continue meds   See #21 23.  Sleep disturbance  Melatonin initiated on 6/30, increased on 7/1  Improved 24. 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

## 2018-06-21 NOTE — Progress Notes (Signed)
Occupational Therapy Discharge Summary  Patient Details  Name: Erika Fry MRN: [id number] Date of Birth: [birth year]  Today's Date: 06/21/2018 OT Individual Time: 1100-1200 OT Individual Time Calculation (min): 60 min   1:1. Pt seated in w/c with no c/o pain. Pt grooms at sink with MOD I seated in w/c. Pt tranfers w/c>BSC and toilets with CGA to void bladder while using RW for cloyhinh .Pt completes bathing at sit to stand level with A for steadying only. Pt uses LHSS to wash B lower legs/ feet. OT dons ted hose. Pt uses reacher, sock aide and shoe funnel to don pants and footwear. Pt able to advance pants past hips with CGA using sink to stey self. Pt bathes UB and dons shirt with set up. Exited session with pt seated in w/c, with call light in reach and meal tray set up.   Patient has met 9 of 9 long term goals due to improved activity tolerance, improved balance, postural control, ability to compensate for deficits, improved attention, improved awareness and improved coordination.  Patient to discharge at [facility] Assist level.  Patient's care partner is independent to provide the necessary physical assistance at discharge.    Reasons goals not met: n/a  Recommendation:  Patient will benefit from ongoing skilled OT services in home health setting to continue to advance functional skills in the area of BADL and iADL.  Equipment: Wide DAC and TTB  Reasons for discharge: treatment goals met  Patient/family agrees with progress made and goals achieved: Yes  OT Discharge Precautions/Restrictions  Precautions Precautions: Fall Precaution Comments: Pacemaker placement 4 weeks ago Required Braces or Orthoses: Spinal Brace Spinal Brace: Lumbar corset Restrictions Weight Bearing Restrictions: No General   Vital Signs   Pain Pain Assessment Pain Score: 0-No pain ADL ADL ADL Comments: See functional navigator Vision Baseline Vision/History: Wears glasses Wears Glasses:  Reading only Patient Visual Report: No change from baseline Vision Assessment?: No apparent visual deficits Perception    WFL Praxis   WFL Cognition Overall Cognitive Status: Within Functional Limits for tasks assessed Arousal/Alertness: Awake/alert Orientation Level: Oriented X4 Attention: Alternating Alternating Attention: Appears intact Memory: Appears intact Awareness: Appears intact Problem Solving: Appears intact Safety/Judgment: Appears intact Sensation Sensation Light Touch: Appears Intact Proprioception: Appears Intact Stereognosis: Not tested Coordination Gross Motor Movements are Fluid and Coordinated: Yes Fine Motor Movements are Fluid and Coordinated: Yes Finger Nose Finger Test: [facility] Motor  Motor Motor: Within Functional Limits Mobility  Transfers Sit to Stand: Minimal Assistance - Patient > 75%;Contact Guard/Touching assist Stand to Sit: Contact Guard/Touching assist;Minimal Assistance - Patient > 75%  Trunk/Postural Assessment  Cervical Assessment Cervical Assessment: Exceptions to WFL(head forward) Thoracic Assessment Thoracic Assessment: Exceptions to WFL(rounded shoulders) Lumbar Assessment Lumbar Assessment: Exceptions to WFL(LSO) Postural Control Postural Control: Within Functional Limits  Balance Static Sitting Balance Static Sitting - Level of Assistance: 6: Modified independent (Device/Increase time) Dynamic Sitting Balance Dynamic Sitting - Level of Assistance: 6: Modified independent (Device/Increase time) Static Standing Balance Static Standing - Balance Support: Bilateral upper extremity supported Static Standing - Level of Assistance: 5: Stand by assistance Dynamic Standing Balance Dynamic Standing - Balance Support: Right upper extremity supported Dynamic Standing - Level of Assistance: 4: Min assist Extremity/Trunk Assessment RUE Assessment RUE Assessment: Exceptions to [facility] General Strength Comments: 4+/5 overall LUE  Assessment LUE Assessment: Exceptions to [facility] General Strength Comments: 4+/5 overall   See Function Navigator for Current Functional Status.  Lowella Dell Kendahl Bumgardner 06/21/2018, 6:53 AM

## 2018-06-21 NOTE — Progress Notes (Signed)
Physical Therapy Session Note  Patient Details  Name: Erika Fry MRN: [id number] Date of Birth: [birth year]  Today's Date: 06/21/2018 PT Individual Time: 0803-0900 PT Individual Time Calculation (min): 57 min   Short Term Goals: Week 4:  PT Short Term Goal 1 (Week 4): STG =LTG due to ELOS    Skilled Therapeutic Interventions/Progress Updates: Pt presented in bed agreeable to therapy. PTA donned TED hose and shorts total A in bed for time management. Pt performed bed mobility with supervision and increased time with use of features. Performed sit to stand form elevated bed min A and use of RW while PTA assisted with completing LB clothing management. Pt performed ambulatory transfer to w/c and transported to ortho gym for functional tasks including car transfer, gait, and steps. Pt was able to complete all activities at overall minA level. Pt propelled w/c back to room from rehab gym with increased time and multiple breaks due to fatigue. Pt remained in w/c at end of session with call bell within reach and needs met.      Therapy Documentation Precautions:  Precautions Precautions: Fall Precaution Comments: Pacemaker placement 4 weeks ago Required Braces or Orthoses: Spinal Brace Spinal Brace: Lumbar corset Restrictions Weight Bearing Restrictions: No General:   Vital Signs: Therapy Vitals Temp: 98.4 F (36.9 C) Temp Source: Oral Pulse Rate: 78 Resp: 18 BP: 134/68 Patient Position (if appropriate): Lying Oxygen Therapy SpO2: 99 % O2 Device: Room Air Pain:   Mobility:   Locomotion :    Trunk/Postural Assessment : Cervical Assessment Cervical Assessment: Exceptions to WFL(forward head) Thoracic Assessment Thoracic Assessment: Exceptions to WFL(rounded shoulders) Lumbar Assessment Lumbar Assessment: Exceptions to WFL(LSO) Postural Control Postural Control: Within Functional Limits  Balance: Static Sitting Balance Static Sitting - Level of Assistance: 6:  Modified independent (Device/Increase time) Dynamic Sitting Balance Dynamic Sitting - Level of Assistance: 6: Modified independent (Device/Increase time) Static Standing Balance Static Standing - Balance Support: Bilateral upper extremity supported Static Standing - Level of Assistance: 5: Stand by assistance Dynamic Standing Balance Dynamic Standing - Balance Support: Left upper extremity supported;Right upper extremity supported Dynamic Standing - Level of Assistance: 4: Min assist Dynamic Standing - Balance Activities: Reaching across midline;Reaching for objects Exercises:   Other Treatments:     See Function Navigator for Current Functional Status.   Therapy/Group: Individual Therapy  Tiago Humphrey  Deloyce Walthers, PTA  06/21/2018, 4:08 PM

## 2018-06-21 NOTE — Progress Notes (Signed)
Assessment:  1CKD 4 2Volume excess 3 Lumbar discitis Plan: 1Cont. POdiuretic dose. K supp, f/u labs in AM.  I will see in f/u at office after discharge  Subjective: Interval History: Good UOP, feels better.  Still some DOE Objective: Vital signs in last 24 hours: Temp:  [98.2 F (36.8 C)-98.6 F (37 C)] 98.6 F (37 C) (07/20 2014) Pulse Rate:  [78-97] 97 (07/20 2014) Resp:  [17-18] 18 (07/20 2014) BP: (123-131)/(63-69) 131/69 (07/20 2014) SpO2:  [98 %] 98 % (07/20 2014) Weight:  [92.9 kg (204 lb 12.9 oz)] 92.9 kg (204 lb 12.9 oz) (07/21 0300) Weight change: -1.3 kg (-2 lb 13.9 oz)  Intake/Output from previous day: 07/20 0701 - 07/21 0700 In: 720 [P.O.:720] Out: 3425 [Urine:3425] Intake/Output this shift: Total I/O In: 250 [P.O.:250] Out: 600 [Urine:600]  General appearance: alert and cooperative Back: brace Resp: clear to auscultation bilaterally Cardio: regular rate and rhythm, S1, S2 normal, no murmur, click, rub or gallop Extremities: edema 1 to 2+ BLE  Lab Results: Recent Labs    06/19/18 0430 06/20/18 0359  WBC 10.0 9.5  HGB 8.3* 8.3*  HCT 26.3* 25.9*  PLT 202 187   BMET:  Recent Labs    06/19/18 0430 06/20/18 0359  NA 136 138  K 3.8 3.5  CL 95* 96*  CO2 29 30  GLUCOSE 80 87  BUN 86* 83*  CREATININE 2.28* 2.18*  CALCIUM 9.1 9.1   No results for input(s): PTH in the last 72 hours. Iron Studies: No results for input(s): IRON, TIBC, TRANSFERRIN, FERRITIN in the last 72 hours. Studies/Results: No results found.  Scheduled: . acetaminophen  650 mg Oral TID AC & HS  . allopurinol  200 mg Oral Daily  . amLODipine  10 mg Oral Daily  . Chlorhexidine Gluconate Cloth  6 each Topical Q0600  . [START ON 06/26/2018] darbepoetin (ARANESP) injection - NON-DIALYSIS  100 mcg Subcutaneous Q28 days  . ferrous sulfate  325 mg Oral BID WC  . folic acid  1 mg Oral Daily  . furosemide  80 mg Oral BID  . hydrALAZINE  75 mg Oral TID  . insulin aspart  0-5  Units Subcutaneous QHS  . insulin aspart  0-9 Units Subcutaneous TID WC  . insulin glargine  20 Units Subcutaneous Daily  . levothyroxine  75 mcg Oral QAC breakfast  . lidocaine  1 patch Transdermal Q24H  . linezolid  600 mg Oral Q12H  . loratadine  10 mg Oral Daily  . Melatonin  1.5 mg Oral QHS  . multivitamin with minerals   Oral Daily  . oxyCODONE  5 mg Oral BID WC  . pantoprazole  40 mg Oral Q0600  . potassium chloride  20 mEq Oral Daily  . protein supplement shake  11 oz Oral TID  . senna  1 tablet Oral Daily  . simvastatin  40 mg Oral QHS  . tiZANidine  2 mg Oral Daily  . vitamin B-12  1,000 mcg Oral Daily    LOS: 30 days   Estanislado Emms 06/21/2018,12:06 PM

## 2018-06-21 NOTE — Progress Notes (Signed)
Physical Therapy Discharge Summary  Patient Details  Name: Erika Fry MRN: [id number] Date of Birth: 02-Apr-1946  Today's Date: 06/21/2018   Patient has met 9 of 10 long term goals due to improved activity tolerance, improved balance, increased strength, decreased pain, improved attention, improved awareness and improved coordination.  Patient to discharge at an ambulatory level [place].   Patient's care partner is independent to provide the necessary physical assistance at discharge.  Reasons goals not met: continued LE weakness requires increased assist with mobility intermittently.   Recommendation:  Patient will benefit from ongoing skilled PT services in home health setting to continue to advance safe functional mobility, address ongoing impairments in endurance, balance, safety, gait, strength, and minimize fall risk.  Equipment: 20x18 lightweight w/c with ELRs, cushion, wide rw, wide drop arm commode, tub transfer bench and hospital bed  Reasons for discharge: treatment goals met  Patient/family agrees with progress made and goals achieved: Yes  PT Discharge Precautions/Restrictions Precautions Precautions: Fall Precaution Comments: Pacemaker placement 4 weeks ago Required Braces or Orthoses: Spinal Brace Spinal Brace: Lumbar corset Restrictions Weight Bearing Restrictions: No   Pain Pain Assessment Pain Score: 0-No pain Vision/Perception     WFL Cognition Overall Cognitive Status: Within Functional Limits for tasks assessed Arousal/Alertness: Awake/alert Orientation Level: Oriented X4 Attention: Alternating Alternating Attention: Appears intact Memory: Appears intact Awareness: Appears intact Problem Solving: Appears intact Safety/Judgment: Appears intact Sensation Sensation Light Touch: Appears Intact Hot/Cold: Appears Intact Proprioception: Appears Intact Stereognosis: Not tested Coordination Gross Motor Movements are Fluid and Coordinated:  Yes Fine Motor Movements are Fluid and Coordinated: Yes Finger Nose Finger Test: [facility] Motor  Motor Motor: Within Functional Limits  Mobility Bed Mobility Bed Mobility: Rolling Right;Rolling Left;Sit to Supine Rolling Right: Supervision/verbal cueing(with bed rails) Rolling Left: Supervision/Verbal cueing(with bed rails) Sit to Supine: Minimal Assistance - Patient > 75% Transfers Transfers: Sit to Stand;Stand to Sit;Stand Pivot Transfers Sit to Stand: Minimal Assistance - Patient > 75% Stand to Sit: Minimal Assistance - Patient > 75% Stand Pivot Transfers: Minimal Assistance - Patient > 75% Stand Pivot Transfer Details: Verbal cues for technique;Verbal cues for precautions/safety;Verbal cues for sequencing;Manual facilitation for weight shifting Transfer (Assistive device): Rolling walker Locomotion  Gait Ambulation: Yes Gait Assistance: Contact Guard/Touching assist Gait Distance (Feet): 60 Feet Assistive device: Rolling walker Gait Gait: Yes Gait Pattern: Decreased step length - right;Decreased weight shift to right;Poor foot clearance - right Stairs / Additional Locomotion Stairs: Yes Stairs Assistance: Minimal Assistance - Patient > 75%  Trunk/Postural Assessment  Cervical Assessment Cervical Assessment: Exceptions to WFL(head forward) Thoracic Assessment Thoracic Assessment: Exceptions to WFL(rounded shoulders) Lumbar Assessment Lumbar Assessment: Exceptions to WFL(LSO) Postural Control Postural Control: Within Functional Limits  Balance Static Sitting Balance Static Sitting - Level of Assistance: 6: Modified independent (Device/Increase time) Dynamic Sitting Balance Dynamic Sitting - Level of Assistance: 6: Modified independent (Device/Increase time) Static Standing Balance Static Standing - Balance Support: Bilateral upper extremity supported Static Standing - Level of Assistance: 5: Stand by assistance Dynamic Standing Balance Dynamic Standing - Balance Support:  Right upper extremity supported Dynamic Standing - Level of Assistance: 4: Min assist Extremity Assessment  RUE Assessment RUE Assessment: Exceptions to [facility] General Strength Comments: 4+/5 overall LUE Assessment LUE Assessment: Exceptions to [facility] General Strength Comments: 4+/5 overall RLE Assessment RLE Assessment: Exceptions to [facility] General Strength Comments: grossly 4/5 LLE Assessment LLE Assessment: Exceptions to [facility] Passive Range of Motion (PROM) Comments: [facility] General Strength Comments: Grossly 4/5   See Function Navigator  for Current Functional Status.  [name] 06/21/2018, 8:54 AM

## 2018-06-22 LAB — CBC
HCT: 26.5 % — ABNORMAL LOW (ref 36.0–46.0)
Hemoglobin: 8.3 g/dL — ABNORMAL LOW (ref 12.0–15.0)
MCH: 30 pg (ref 26.0–34.0)
MCHC: 31.3 g/dL (ref 30.0–36.0)
MCV: 95.7 fL (ref 78.0–100.0)
Platelets: 160 10*3/uL (ref 150–400)
RBC: 2.77 MIL/uL — ABNORMAL LOW (ref 3.87–5.11)
RDW: 18.9 % — AB (ref 11.5–15.5)
WBC: 9 10*3/uL (ref 4.0–10.5)

## 2018-06-22 LAB — RENAL FUNCTION PANEL
ALBUMIN: 2.6 g/dL — AB (ref 3.5–5.0)
Anion gap: 12 (ref 5–15)
BUN: 71 mg/dL — AB (ref 8–23)
CALCIUM: 9.3 mg/dL (ref 8.9–10.3)
CO2: 32 mmol/L (ref 22–32)
Chloride: 95 mmol/L — ABNORMAL LOW (ref 98–111)
Creatinine, Ser: 2.25 mg/dL — ABNORMAL HIGH (ref 0.44–1.00)
GFR calc Af Amer: 24 mL/min — ABNORMAL LOW (ref >60)
GFR calc non Af Amer: 20 mL/min — ABNORMAL LOW (ref >60)
GLUCOSE: 99 mg/dL (ref 70–99)
PHOSPHORUS: 4 mg/dL (ref 2.5–4.6)
Potassium: 3.4 mmol/L — ABNORMAL LOW (ref 3.5–5.1)
SODIUM: 139 mmol/L (ref 135–145)

## 2018-06-22 LAB — GLUCOSE, CAPILLARY
GLUCOSE-CAPILLARY: 93 mg/dL (ref 70–99)
GLUCOSE-CAPILLARY: 130 mg/dL — AB (ref 70–99)

## 2018-06-22 MED ORDER — POTASSIUM CHLORIDE ER 10 MEQ PO TBCR: 10.0000 meq | EXTENDED_RELEASE_TABLET | Freq: Three times a day (TID) | ORAL | 0 refills | Status: DC

## 2018-06-22 MED ORDER — SODIUM CHLORIDE 0.9 % IV SOLN: 2.0000 g | INTRAVENOUS | Status: DC

## 2018-06-22 MED ORDER — SODIUM CHLORIDE 0.9 % IV SOLN: 2.0000 g | INTRAVENOUS | 0 refills | Status: AC

## 2018-06-22 MED ORDER — POLYETHYLENE GLYCOL 3350 17 G PO PACK: 17.0000 g | PACK | Freq: Every day | ORAL | 0 refills | Status: DC | PRN

## 2018-06-22 MED ORDER — AMLODIPINE BESYLATE 10 MG PO TABS: 10.0000 mg | ORAL_TABLET | Freq: Every day | ORAL | 0 refills | Status: DC

## 2018-06-22 MED ORDER — VITAMIN B-12 1000 MCG PO TABS: 1000.0000 ug | ORAL_TABLET | Freq: Every day | ORAL | 0 refills | Status: DC

## 2018-06-22 MED ORDER — TRAMADOL HCL 50 MG PO TABS: 50.0000 mg | ORAL_TABLET | Freq: Four times a day (QID) | ORAL | 0 refills | Status: DC | PRN

## 2018-06-22 MED ORDER — INSULIN PEN NEEDLE 31G X 6 MM MISC: 1.0000 "application " | Freq: Every day | 0 refills | Status: DC

## 2018-06-22 MED ORDER — TIZANIDINE HCL 2 MG PO TABS: 2.0000 mg | ORAL_TABLET | Freq: Every day | ORAL | 0 refills | Status: DC

## 2018-06-22 MED ORDER — ACETAMINOPHEN 325 MG PO TABS: 650.0000 mg | ORAL_TABLET | Freq: Three times a day (TID) | ORAL | Status: DC

## 2018-06-22 MED ORDER — HYDRALAZINE HCL 50 MG PO TABS: 75.0000 mg | ORAL_TABLET | Freq: Three times a day (TID) | ORAL | 0 refills | Status: DC

## 2018-06-22 MED ORDER — POTASSIUM CHLORIDE CRYS ER 20 MEQ PO TBCR
20.0000 meq | EXTENDED_RELEASE_TABLET | Freq: Once | ORAL | Status: AC
  Administered 2018-06-22: 20 meq via ORAL
  Filled 2018-06-22: qty 1

## 2018-06-22 MED ORDER — FUROSEMIDE 80 MG PO TABS: 80.0000 mg | ORAL_TABLET | Freq: Two times a day (BID) | ORAL | 0 refills | Status: DC

## 2018-06-22 MED ORDER — HEPARIN SOD (PORK) LOCK FLUSH 100 UNIT/ML IV SOLN
250.0000 [IU] | INTRAVENOUS | Status: AC | PRN
  Administered 2018-06-22: 250 [IU]

## 2018-06-22 MED ORDER — LIDOCAINE 5 % EX PTCH: 1.0000 | MEDICATED_PATCH | CUTANEOUS | 0 refills | Status: DC

## 2018-06-22 MED ORDER — SENNA 8.6 MG PO TABS: 1.0000 | ORAL_TABLET | Freq: Every day | ORAL | 0 refills | Status: DC

## 2018-06-22 MED ORDER — FOLIC ACID 1 MG PO TABS: 1.0000 mg | ORAL_TABLET | Freq: Every day | ORAL | 0 refills | Status: DC

## 2018-06-22 MED ORDER — SIMVASTATIN 40 MG PO TABS: 20.0000 mg | ORAL_TABLET | Freq: Every day | ORAL | 0 refills | Status: DC

## 2018-06-22 MED ORDER — INSULIN GLARGINE 100 UNIT/ML SOLOSTAR PEN: 20.0000 [IU] | PEN_INJECTOR | Freq: Every day | SUBCUTANEOUS | 0 refills | Status: DC

## 2018-06-22 MED ORDER — OXYCODONE HCL 5 MG PO TABS: 5.0000 mg | ORAL_TABLET | Freq: Four times a day (QID) | ORAL | 0 refills | Status: DC | PRN

## 2018-06-22 MED ORDER — LINEZOLID 600 MG PO TABS: 600.0000 mg | ORAL_TABLET | Freq: Two times a day (BID) | ORAL | 0 refills | Status: DC

## 2018-06-22 MED ORDER — FERROUS SULFATE 325 (65 FE) MG PO TABS: 325.0000 mg | ORAL_TABLET | Freq: Two times a day (BID) | ORAL | 0 refills | Status: DC

## 2018-06-22 MED ORDER — MELATONIN 3 MG PO TABS: 1.5000 mg | ORAL_TABLET | Freq: Every day | ORAL | 0 refills | Status: DC

## 2018-06-22 NOTE — Discharge Summary (Signed)
Physician Discharge Summary  Patient ID: Erika Fry MRN: [id number] DOB/AGE: Jun 15, 1946 72 y.o.  Admit date: 05/22/2018 Discharge date: 06/24/2018  Discharge Diagnoses:  Principal Problem:   Lumbar discitis Active Problems:   CKD (chronic kidney disease), stage IV (HCC)   Iron deficiency anemia   Essential hypertension   Acute right-sided low back pain with right-sided sciatica   Acute blood loss anemia   Anemia of chronic disease   Thrombocytopenia (HCC)   Diabetes mellitus (HCC)   Morbid obesity (HCC)   Benign essential HTN   Hypoalbuminemia due to protein-calorie malnutrition (HCC)   Hypokalemia   Generalized edema   Discharged Condition: stable   Significant Diagnostic Studies: Dg Chest 1 View  Result Date: 06/10/2018 CLINICAL DATA:  Episodic shortness of breath today. Possible pneumonia. EXAM: CHEST  1 VIEW COMPARISON:  06/07/2018 and 05/28/2018 radiographs. FINDINGS: 1407 hour. Right IJ central venous catheter extends to the lower right atrial level, stable. Left subclavian pacemaker leads appear unchanged. There is stable mild cardiomegaly and aortic atherosclerosis. Left-greater-than-right basilar opacities and a probable small left pleural effusion have not significantly changed. Right apical opacity has slightly improved. There is no pneumothorax. The bones appear unchanged. IMPRESSION: No significant change in left-greater-than-right basilar airspace opacities and small left pleural effusion. Right apical opacity has slightly improved over the last 3 days. Electronically Signed   By: Richardean Sale M.D.   On: 06/10/2018 14:18   Dg Chest 1 View  Result Date: 05/28/2018 CLINICAL DATA:  Shortness of breath. EXAM: CHEST  1 VIEW COMPARISON:  March 26, 2018 FINDINGS: The distal tip of the new right central line is likely 14 mm below the caval atrial junction. Stable pacemaker. No pneumothorax. Mild opacity in left base may represent atelectasis or scar in is similar to  slightly more prominent. No other changes. IMPRESSION: 1. New right central line, terminating just in the right atrium, 14 mm inferior to the caval atrial junction. 2. Mild left retrocardiac opacity, likely scarring. This opacity is very similar to the previous study. Electronically Signed   By: Dorise Bullion III M.D   On: 05/28/2018 13:48   Dg Chest 2 View  Result Date: 06/15/2018 CLINICAL DATA:  Fluid retention. EXAM: CHEST - 2 VIEW COMPARISON:  Chest radiograph June 10, 2018 FINDINGS: Cardiac silhouette is upper limits of normal size. Pulmonary vascular congestion. Calcified aortic arch. Small LEFT pleural effusion. LEFT lung base strandy densities, improved aeration of the lung bases. Dual lead LEFT cardiac pacemaker in situ. Tunneled dual lumen catheter via RIGHT subclavian venous approach with distal tip projecting in proximal RIGHT atrium, unchanged. Catheter fragment projecting RIGHT upper chest wall. Osteopenia. Accentuated kyphosis. IMPRESSION: Small residual LEFT pleural effusion with LEFT lung base atelectasis, less likely consolidation. Improved aeration. Borderline cardiomegaly.  Pulmonary vascular congestion. Aortic Atherosclerosis (ICD10-I70.0). Electronically Signed   By: Elon Alas M.D.   On: 06/15/2018 20:10   Dg Lumbar Spine Complete  Result Date: 06/03/2018 CLINICAL DATA:  Low back pain EXAM: LUMBAR SPINE - COMPLETE 4+ VIEW COMPARISON:  05/13/2018 FINDINGS: Five lumbar type vertebral bodies are well visualized. Vertebral body height is well maintained. There is indistinctness of the inferior endplate at L4 and superior endplate at L5 consistent with the known history of underlying discitis and osteomyelitis. The overall appearance is similar to that seen on recent MRI examination. No compression deformity is seen. Aortic calcifications are noted. IMPRESSION: Changes consistent with the given clinical history of osteomyelitis and discitis at L4-5. Electronically Signed  By: Inez Catalina M.D.   On: 06/03/2018 16:22   Ct Head Wo Contrast  Result Date: 05/28/2018 CLINICAL DATA:  Initial evaluation for acute altered mental status. EXAM: CT HEAD WITHOUT CONTRAST TECHNIQUE: Contiguous axial images were obtained from the base of the skull through the vertex without intravenous contrast. COMPARISON:  None available. FINDINGS: Brain: Generalized age-related cerebral atrophy with mild chronic small vessel ischemic disease. No acute intracranial hemorrhage. No acute large vessel territory infarct. No mass lesion, midline shift or mass effect. No hydrocephalus. No extra-axial fluid collection. Vascular: No hyperdense vessel. Calcified atherosclerosis present at the skull base. Skull: Scalp soft tissues within normal limits.  Calvarium intact. Sinuses/Orbits: Globes and orbital soft tissues within normal limits. Small retention cysts noted within the right sphenoid sinus. Paranasal sinuses are otherwise clear. Small right mastoid effusion noted. Other: None. IMPRESSION: 1. No acute intracranial abnormality. 2. Mild age-related cerebral atrophy with chronic small vessel ischemic disease. Electronically Signed   By: Jeannine Boga M.D.   On: 05/28/2018 15:48   Dg Chest Port 1 View  Result Date: 06/07/2018 CLINICAL DATA:  Increased shortness of breath EXAM: PORTABLE CHEST 1 VIEW COMPARISON:  05/28/2018, 03/26/2018 FINDINGS: Right-sided central venous catheter tip projects over the low right atrium. Left-sided pacing device as before. Cardiomegaly. Small moderate left pleural effusion and small right pleural effusion. New foci of airspace disease at the right apex and right base with worsening consolidation at the left base. Vascular congestion and mild edema. No pneumothorax. IMPRESSION: 1. Right-sided central venous catheter tip overlies the right atrium 2. Development of multifocal airspace disease in the right apex and bilateral left greater than right lung bases suspicious for multifocal  pneumonia 3. Cardiomegaly with vascular congestion and mild edema. Small moderate left effusion and trace right pleural effusion Electronically Signed   By: Donavan Foil M.D.   On: 06/07/2018 02:52    Labs:  Basic Metabolic Panel: Recent Labs  Lab 06/17/18 0400 06/18/18 0455 06/19/18 0430 06/20/18 0359 06/22/18 0346  NA 133* 135 136 138 139  K 4.8 4.2 3.8 3.5 3.4*  CL 99 98 95* 96* 95*  CO2 25 28 29 30  32  GLUCOSE 195* 114* 80 87 99  BUN 82* 83* 86* 83* 71*  CREATININE 2.19* 2.09* 2.28* 2.18* 2.25*  CALCIUM 9.0 9.2 9.1 9.1 9.3  PHOS  --   --  4.0  --  4.0    CBC: Recent Labs  Lab 06/18/18 0455 06/19/18 0430 06/20/18 0359 06/22/18 0346  WBC 9.1 10.0 9.5 9.0  NEUTROABS 7.7  --   --   --   HGB 8.0* 8.3* 8.3* 8.3*  HCT 25.4* 26.3* 25.9* 26.5*  MCV 93.0 92.9 92.8 95.7  PLT 196 202 187 160    CBG: Recent Labs  Lab 06/21/18 1210 06/21/18 1649 06/21/18 2129 06/22/18 0644 06/22/18 1129  GLUCAP 107* 191* 163* 93 130*    Brief HPI:   Erika Fry is a 72 year old female with history of hypertension, CKD, anemia of chronic disease, diet-controlled diabetes mellitus, permanent pacemaker with recent admission for fall due to UTI with back pain.  She was readmitted on 05/12/2018 with constipation, hypertension and acute on chronic anemia with hemoglobin at 6.9.  She was treated with fluid boluses and started on PPI.  MRI of the spine done revealing acute osteomyelitis of L4/5 with paraspinous edema within the right psoas muscle, paraspinous fluid collection as well as moderate to severe right and moderate left foraminal stenosis.  She underwent aspiration of fluid collection on the back and cultures were negative.  ID was consulted for input and recommended 6 weeks of IV ceftriaxone with renally dosed daptomycin through July 24.  She has required 6 units of packed red blood cells for anemia.  She was treated with Decadron for pain control which was DC'd prior to discharge.   Renal status has improved off furosemide.  She has had episode of heme positive stools and EGD done revealing single friable gastric polyp pylorus polyp that was biopsied.  She is to follow-up with Dr. Fuller Plan on outpatient basis for colonoscopy as well as follow-up EGD.  Therapy has been ongoing and patient continued to be limited by pain, neuropathy bilateral lower extremity as well as fatigue.  CIR was requested due to functional decline.   Hospital Course: Erika Fry was admitted to rehab 05/22/2018 for inpatient therapies to consist of PT and OT at least three hours five days a week. Past admission physiatrist, therapy team and rehab RN have worked together to provide customized collaborative inpatient rehab.  At admission she was significantly limited by high levels of anxiety as well as pain.  She complained of back pain and had fear of movement as well as severe right lower extremity radicular pain.  She was treated with a short course of steroids and started on gabapentin which to help with radicular pain.  Bands thrombocytopenia has been monitored and this has been stable.  Left upper extremity Dopplers were done due to edema and this was negative for DVT.  Diabetes was monitored with AC at bedtime CBG checks and and showed poor control on diet alone therefore Lantus was added to help with better blood sugar control.    Hospital course has been significant for development of delirium with mild clonus as well as acute on chronic renal failure..  CT of head was done for work-up and EEG was negative for seizure activity.  Mental status changes and mild clonus were felt to be due to gabapentin and baclofen in setting of acute renal failure.  Gabapentin was and baclofen was DC'd until mental status improved.  Her mental status was back to baseline and her activity tolerance started slowly improving.  Low-dose Zanaflex was resumed at bedtime and oxycodone has been used judiciously to help with pain  tolerance as well as improved tolerance of activity.   Her Lasix was discontinued due to worsening of renal status however she started developing fluid overload with increase in weight.  She was started on IV Lasix and nephrology was consulted for input.  They recommended resuming Lasix at 80 mg twice daily as patient's baseline serum creatinine around 3.0.  With aggressive diuresis her weight is down to 198 pounds and fluid overload load has resolved.  Mild hypokalemia noted with diuresis and potassium supplement was adjusted to 30 mEq a day at discharge.  Anemia of chronic disease has been treated with Aranesp.  She was maintained on daptomycin initially however on 7/10, she developed episode of shortness of breath with concerns of eosinophilic pneumonia therefore this was changed to Rocephin and Zyvox.  Infectious disease has followed for input on antibiotic regimen and she is to continue on Rocephin and Zyvox through 7/25 to complete 6 weeks course of antibiotic regimen.  With improvement in mentation as well as pain control she has made steady and great progress.  Her length of stay was extended by 3 days to help with adequate diuresis and medical management.  She is currently at min assist level and will continue to receive further follow-up home health PT, OT and RN by [facility] after discharge.   Rehab course: During patient's stay in rehab weekly team conferences were held to monitor patient's progress, set goals and discuss barriers to discharge. At admission, patient required max assist + 2 for all activity and was limited by high levels of anxiety and pain.  She  has had improvement in activity tolerance, balance, postural control as well as ability to compensate for deficits. She is able to perform grooming tasks seated at modified independent level. She  Requires min assist with use of AE to complete ADLs. She is able to perform transfers with min assist and is able to ambulate 50' X 2 with  RW and min assist. Family education was completed regarding all aspects of care and mobility.    Disposition:  Home  Diet: Carb Modified. Low salt.   Special Instructions: 1.  Monitor blood sugars before meals at bedtime.  Continue insulin for now and can transition to oral agent with MD input as infection resolves. 2.  Keep legs elevated whenever seated.   3.  Need to walk every hour and continue home exercise plan.   Discharge Instructions    Ambulatory referral to Physical Medicine Rehab   Complete by:  As directed    Transitional care appt 1-2 weeks     Allergies as of 06/22/2018      Reactions   Baclofen    somnolence   Codeine Other (See Comments)   Increases Pain and couldn't sleep      Medication List    STOP taking these medications   BIOTIN FORTE 3 MG Tabs Generic drug:  Biotin   DAPTOmycin 780 mg in sodium chloride 0.9 % 100 mL   gabapentin 300 MG capsule Commonly known as:  NEURONTIN   insulin aspart 100 UNIT/ML injection Commonly known as:  novoLOG   insulin glargine 100 UNIT/ML injection Commonly known as:  LANTUS Replaced by:  Insulin Glargine 100 UNIT/ML Solostar Pen   methocarbamol 500 MG tablet Commonly known as:  ROBAXIN     TAKE these medications   acetaminophen 325 MG tablet Commonly known as:  TYLENOL Take 2 tablets (650 mg total) by mouth 4 (four) times daily -  before meals and at bedtime. Notes to patient:  Can modify to as needed as pain improves   allopurinol 100 MG tablet Commonly known as:  ZYLOPRIM Take 2 tablets (200 mg total) by mouth daily.   amLODipine 10 MG tablet Commonly known as:  NORVASC Take 1 tablet (10 mg total) by mouth daily.   cefTRIAXone 2 g in sodium chloride 0.9 % 100 mL Inject 2 g into the vein daily for 2 days. Last dose on 07/24 Start taking on:  06/23/2018 What changed:  additional instructions   cetirizine 10 MG tablet Commonly known as:  ZYRTEC Take 10 mg by mouth at bedtime.   ferrous sulfate  325 (65 FE) MG tablet Take 1 tablet (325 mg total) by mouth 2 (two) times daily with a meal.   fluticasone 50 MCG/ACT nasal spray Commonly known as:  FLONASE Place 2 sprays into both nostrils daily as needed for allergies or rhinitis.   folic acid 1 MG tablet Commonly known as:  FOLVITE Take 1 tablet (1 mg total) by mouth daily.   furosemide 80 MG tablet Commonly known as:  LASIX Take 1 tablet (80 mg total) by mouth  2 (two) times daily.   hydrALAZINE 50 MG tablet Commonly known as:  APRESOLINE Take 1.5 tablets (75 mg total) by mouth 3 (three) times daily. What changed:  how much to take   Insulin Glargine 100 UNIT/ML Solostar Pen Commonly known as:  LANTUS Inject 20 Units into the skin daily. Replaces:  insulin glargine 100 UNIT/ML injection   Insulin Pen Needle 31G X 6 MM Misc 1 application by Does not apply route daily.   levothyroxine 75 MCG tablet Commonly known as:  SYNTHROID, LEVOTHROID Take 75 mcg by mouth daily before breakfast.   lidocaine 5 % Commonly known as:  LIDODERM Place 1 patch onto the skin daily. Available over the counter   linezolid 600 MG tablet Commonly known as:  ZYVOX Take 1 tablet (600 mg total) by mouth every 12 (twelve) hours.   Melatonin 3 MG Tabs Take 0.5 tablets (1.5 mg total) by mouth at bedtime.   MULTI-VITAMIN PO Take 1 tablet by mouth daily.   oxyCODONE 5 MG immediate release tablet--Rx # 20  Commonly known as:  Oxy IR/ROXICODONE Take 1 tablet (5 mg total) by mouth every 6 (six) hours as needed for severe pain. What changed:  how much to take Notes to patient:  We have been giving this to you before therapy --save for sever pain and use ultram instead as first line.    pantoprazole 40 MG tablet Commonly known as:  PROTONIX Take 1 tablet (40 mg total) by mouth daily at 6 (six) AM.   polyethylene glycol packet Commonly known as:  MIRALAX / GLYCOLAX Take 17 g by mouth daily as needed. What changed:    when to take  this  reasons to take this   potassium chloride 10 MEQ tablet Commonly known as:  K-DUR Take 1 tablet (10 mEq total) by mouth 3 (three) times daily.   senna 8.6 MG Tabs tablet Commonly known as:  SENOKOT Take 1 tablet (8.6 mg total) by mouth daily.   simvastatin 40 MG tablet Commonly known as:  ZOCOR Take 0.5 tablets (20 mg total) by mouth at bedtime. What changed:  how much to take   tiZANidine 2 MG tablet Commonly known as:  ZANAFLEX Take 1 tablet (2 mg total) by mouth daily. Start taking on:  06/23/2018   traMADol 50 MG tablet--Rx # 28 Commonly known as:  ULTRAM Take 1 tablet (50 mg total) by mouth every 6 (six) hours as needed for moderate pain.   vitamin B-12 1000 MCG tablet Commonly known as:  CYANOCOBALAMIN Take 1 tablet (1,000 mcg total) by mouth daily.      Follow-up Information    Jamse Arn, MD Follow up.   Specialty:  Physical Medicine and Rehabilitation Why:  Office will call you with follow up appointment Contact information: [address] [place] [address] [phone]        Estanislado Emms, MD Follow up on 07/09/2018.   Specialty:  Nephrology Why:  Be there at 11:15 am for follow up appointment.  Contact information: [place] [address] [phone]        Adrian Prows, MD Follow up.   Specialty:  Cardiology Why:  follow up on pacemaker Contact information: [address] [place] [address] [phone]        [name], MD  Follow up.   Specialty:  Internal Medicine Contact information: [address] [place] [address] [phone]           Signed: Bary Leriche 06/24/2018, 5:33 PM

## 2018-06-22 NOTE — Discharge Instructions (Signed)
Inpatient Rehab Discharge Instructions  [place] Discharge date and time:  06/22/18  Activities/Precautions/ Functional Status: Activity: no lifting, driving, or strenuous exercise for till cleared by MD Diet: diabetic diet and low fat, low cholesterol diet--Low salt.  Wound Care: none needed    Functional status:  ___ No restrictions     ___ Walk up steps independently _X__ 24/7 supervision/assistance   ___ Walk up steps with assistance ___ Intermittent supervision/assistance  ___ Bathe/dress independently ___ Walk with walker     ___ Bathe/dress with assistance ___ Walk Independently    ___ Shower independently ___ Walk with assistance    ___ Shower with assistance _X__ No alcohol     ___ Return to work/school ________    COMMUNITY REFERRALS UPON DISCHARGE:    Home Health:   PT     OT   RN                     Agency:  [facility]    Phone: [phone]    Medical Equipment/Items Ordered: wheelchair, hospital bed, walker, commode and tub bench                                                       Agency/Supplier:  [facility]  Other: antibiotics via [place] @ [phone]   Special Instructions: 1. Monitor blood sugars before meals and at bedtime. 2. Keep legs elevated whenever seated.  3. Need to get up and walk/take few steps every hour.     My questions have been answered and I understand these instructions. I will adhere to these goals and the provided educational materials after my discharge from the hospital.  Patient/Caregiver Signature _______________________________ Date __________  Clinician Signature _______________________________________ Date __________  Please bring this form and your medication list with you to all your follow-up doctor's appointments.

## 2018-06-23 ENCOUNTER — Telehealth: Payer: Self-pay | Admitting: Registered Nurse

## 2018-06-23 NOTE — Telephone Encounter (Signed)
Transitional Care call  Transitional Care Call Questions answered by Daughter in [place]  Patient name: Erika Fry DOB: Apr 03, 1946 1. Are you/is patient experiencing any problems since coming home? No a. Are there any questions regarding any aspect of care? No 2. Are there any questions regarding medications administration/dosing? No a. Are meds being taken as prescribed? Yes b. "Patient should review meds with caller to confirm" Medication List Reviewed 3. Have there been any falls? No 4. Has Home Health been to the house and/or have they contacted you? Yes, [facility]  a. If not, have you tried to contact them? NA b. Can we help you contact them? NA 5. Are bowels and bladder emptying properly? Yes a. Are there any unexpected incontinence issues? No b. If applicable, is patient following bowel/bladder programs? NA 6. Any fevers, problems with breathing, unexpected pain? No 7. Are there any skin problems or new areas of breakdown? No 8. Has the patient/family member arranged specialty MD follow up (ie cardiology/neurology/renal/surgical/etc.)?  Yes, appointments have been scheduled. a. Can we help arrange? NA 9. Does the patient need any other services or support that we can help arrange? No 10. Are caregivers following through as expected in assisting the patient? Yes 11. Has the patient quit smoking, drinking alcohol, or using drugs as recommended? Amber states [name] doesn't smoke, drink alcohol or use illicit drugs.   Appointment date/time 07/02/2018 at 12:40 for 1:00 appointment, with Danella Sensing ANP. At [place]

## 2018-06-26 ENCOUNTER — Telehealth: Payer: Self-pay | Admitting: *Deleted

## 2018-06-26 NOTE — Telephone Encounter (Signed)
Ulice Dash, RN, [facility] left a message asking for verbal orders for [facility] 2week4 followed by 1week5 plus 4 prn visits. Reviewed medical record, social work note indicates [facility].  Verbal orders given per office protocol

## 2018-06-30 ENCOUNTER — Encounter: Payer: Medicare Other | Admitting: Internal Medicine

## 2018-07-02 ENCOUNTER — Encounter: Payer: Self-pay | Admitting: Registered Nurse

## 2018-07-02 ENCOUNTER — Other Ambulatory Visit: Payer: Self-pay | Admitting: Physical Medicine & Rehabilitation

## 2018-07-02 ENCOUNTER — Encounter: Payer: Medicare Other | Attending: Registered Nurse | Admitting: Registered Nurse

## 2018-07-02 VITALS — BP 117/69 | HR 79 | Ht 65.0 in | Wt 177.4 lb

## 2018-07-02 DIAGNOSIS — E1122 Type 2 diabetes mellitus with diabetic chronic kidney disease: Secondary | ICD-10-CM | POA: Insufficient documentation

## 2018-07-02 DIAGNOSIS — D62 Acute posthemorrhagic anemia: Secondary | ICD-10-CM | POA: Diagnosis not present

## 2018-07-02 DIAGNOSIS — E039 Hypothyroidism, unspecified: Secondary | ICD-10-CM | POA: Diagnosis not present

## 2018-07-02 DIAGNOSIS — E7800 Pure hypercholesterolemia, unspecified: Secondary | ICD-10-CM | POA: Insufficient documentation

## 2018-07-02 DIAGNOSIS — M109 Gout, unspecified: Secondary | ICD-10-CM | POA: Insufficient documentation

## 2018-07-02 DIAGNOSIS — D509 Iron deficiency anemia, unspecified: Secondary | ICD-10-CM | POA: Insufficient documentation

## 2018-07-02 DIAGNOSIS — Z950 Presence of cardiac pacemaker: Secondary | ICD-10-CM | POA: Insufficient documentation

## 2018-07-02 DIAGNOSIS — R269 Unspecified abnormalities of gait and mobility: Secondary | ICD-10-CM | POA: Insufficient documentation

## 2018-07-02 DIAGNOSIS — M4646 Discitis, unspecified, lumbar region: Secondary | ICD-10-CM

## 2018-07-02 DIAGNOSIS — K219 Gastro-esophageal reflux disease without esophagitis: Secondary | ICD-10-CM | POA: Insufficient documentation

## 2018-07-02 DIAGNOSIS — R5381 Other malaise: Secondary | ICD-10-CM | POA: Insufficient documentation

## 2018-07-02 DIAGNOSIS — E0822 Diabetes mellitus due to underlying condition with diabetic chronic kidney disease: Secondary | ICD-10-CM

## 2018-07-02 DIAGNOSIS — N184 Chronic kidney disease, stage 4 (severe): Secondary | ICD-10-CM | POA: Diagnosis not present

## 2018-07-02 DIAGNOSIS — I509 Heart failure, unspecified: Secondary | ICD-10-CM | POA: Insufficient documentation

## 2018-07-02 DIAGNOSIS — Z833 Family history of diabetes mellitus: Secondary | ICD-10-CM | POA: Diagnosis not present

## 2018-07-02 DIAGNOSIS — R609 Edema, unspecified: Secondary | ICD-10-CM

## 2018-07-02 DIAGNOSIS — I10 Essential (primary) hypertension: Secondary | ICD-10-CM | POA: Diagnosis not present

## 2018-07-02 DIAGNOSIS — D508 Other iron deficiency anemias: Secondary | ICD-10-CM

## 2018-07-02 DIAGNOSIS — I130 Hypertensive heart and chronic kidney disease with heart failure and stage 1 through stage 4 chronic kidney disease, or unspecified chronic kidney disease: Secondary | ICD-10-CM | POA: Insufficient documentation

## 2018-07-02 NOTE — Progress Notes (Signed)
Subjective:    Patient ID: Erika Fry, female    DOB: [birth year], 72 y.o.   MRN: [id number]  HPI: Ms. Erika Fry is a 72 year old female who is here for transitional care visit in follow up of her lumbar discitis, acute blood loss anemia, iron deficiency anemia, CKD, fluid retention, and DM. She has been home with home health therapies from [facility], she reports therapy will began tomorrow. She states her pain is located in her right hip. She rates her pain 5. She is walking in her home with walker she states.   Arrived in wheelchair.  Son in the room.   Pain Inventory Average Pain 7 Pain Right Now 5 My pain is intermittent, dull and aching  In the last 24 hours, has pain interfered with the following? General activity 5 Relation with others 4 Enjoyment of life 5 What TIME of day is your pain at its worst? na Sleep (in general) na  Pain is worse with: walking and standing Pain improves with: rest and medication Relief from Meds: na  Mobility use a walker ability to climb steps?  no do you drive?  no  Function retired  Neuro/Psych No problems in this area  Prior Studies Any changes since last visit?  no  Physicians involved in your care Any changes since last visit?  no   Family History  Problem Relation Age of Onset  . Hypertension Mother   . Diabetes Mellitus II Father   . Heart disease Father   . Gastric cancer Brother   . Diabetes Mellitus II Brother    Social History   Socioeconomic History  . Marital status: Widowed    Spouse name: Not on file  . Number of children: Not on file  . Years of education: Not on file  . Highest education level: Not on file  Occupational History  . Not on file  Social Needs  . Financial resource strain: Not on file  . Food insecurity:    Worry: Not on file    Inability: Not on file  . Transportation needs:    Medical: Not on file    Non-medical: Not on file  Tobacco Use  . Smoking status:  Never Smoker  . Smokeless tobacco: Never Used  Substance and Sexual Activity  . Alcohol use: Not Currently  . Drug use: Never  . Sexual activity: Not Currently  Lifestyle  . Physical activity:    Days per week: Not on file    Minutes per session: Not on file  . Stress: Not on file  Relationships  . Social connections:    Talks on phone: Not on file    Gets together: Not on file    Attends religious service: Not on file    Active member of club or organization: Not on file    Attends meetings of clubs or organizations: Not on file    Relationship status: Not on file  Other Topics Concern  . Not on file  Social History Narrative  . Not on file   Past Surgical History:  Procedure Laterality Date  . APPENDECTOMY    . BIOPSY  05/19/2018   Procedure: BIOPSY;  Surgeon: Ladene Artist, MD;  Location: [facility] ENDOSCOPY;  Service: Endoscopy;;  . ESOPHAGOGASTRODUODENOSCOPY N/A 05/19/2018   Procedure: ESOPHAGOGASTRODUODENOSCOPY (EGD);  Surgeon: Ladene Artist, MD;  Location: [facility] ENDOSCOPY;  Service: Endoscopy;  Laterality: N/A;  . INSERT / REPLACE / REMOVE PACEMAKER  03/25/2018  .  IR FLUORO GUIDE CV LINE RIGHT  05/18/2018  . IR LUMBAR DISC ASPIRATION W/IMG GUIDE  05/15/2018  . IR US GUIDE VASC ACCESS RIGHT  05/18/2018  . KNEE ARTHROSCOPY Bilateral   . PACEMAKER IMPLANT N/A 03/25/2018   Procedure: PACEMAKER IMPLANT;  Surgeon: Evans Lance, MD;  Location: [place] CV LAB;  Service: Cardiovascular;  Laterality: N/A;  . PACEMAKER PLACEMENT Left 03/2018  . RIGHT HEART CATH N/A 03/20/2018   Procedure: RIGHT HEART CATH;  Surgeon: Nigel Mormon, MD;  Location: [place] CV LAB;  Service: Cardiovascular;  Laterality: N/A;  . TEE WITHOUT CARDIOVERSION N/A 03/20/2018   Procedure: TRANSESOPHAGEAL ECHOCARDIOGRAM (TEE);  Surgeon: Nigel Mormon, MD;  Location: [facility] ENDOSCOPY;  Service: Cardiovascular;  Laterality: N/A;  . TONSILLECTOMY     Past Medical History:  Diagnosis Date  .  Arthritis    "knees, thumbs" (03/25/2018)  . CKD (chronic kidney disease), stage IV ([place])   . Diet-controlled diabetes mellitus ([place])   . GERD (gastroesophageal reflux disease)   . Gout    "on daily RX" (03/25/2018)  . Heart murmur   . High cholesterol   . Hypertension   . Hypothyroidism   . Iron deficiency anemia    "had to get an iron infusion"  . Migraine    "used to have them growing up" (03/25/2018)  . Presence of permanent cardiac pacemaker 03/25/2018   Ht 5\' 5"  (1.651 m)   Wt 175 lb (79.4 kg)   BMI 29.12 kg/m   Opioid Risk Score:   Fall Risk Score:  `1  Depression screen PHQ 2/9  No flowsheet data found.   Review of Systems  Constitutional: Negative.   HENT: Negative.   Eyes: Negative.   Respiratory: Negative.   Cardiovascular: Negative.   Endocrine: Negative.   Genitourinary: Negative.   Musculoskeletal: Positive for arthralgias, gait problem and myalgias.  Skin: Negative.   Allergic/Immunologic: Negative.   Hematological: Negative.   Psychiatric/Behavioral: Negative.   All other systems reviewed and are negative.      Objective:   Physical Exam  Constitutional: She is oriented to person, place, and time. She appears well-developed and well-nourished.  HENT:  Head: Normocephalic and atraumatic.  Neck: Normal range of motion. Neck supple.  Cardiovascular: Normal rate and regular rhythm.  Pulmonary/Chest: Effort normal and breath sounds normal.  Musculoskeletal: She exhibits edema.  Normal Muscle Bulk and Muscle testing reveals: Upper Extremities: Full ROM and Muscle Strength 5/5 Lower Extremities: Full ROM and Muscle Strength 5/5 Arrived in wheelchair Lower Extremities with edema  Arrived in wheelchair  Neurological: She is alert and oriented to person, place, and time.  Skin: Skin is warm and dry.  Psychiatric: She has a normal mood and affect. Her behavior is normal.  Nursing note and vitals reviewed.         Assessment & Plan:  1. Lumbar  Discitis: Completed IV Antibiotic: Has an appointment with infectious Disease for follow up. 2. Acute Blood Loss Anemia: She was transfused while in hospital. She's going for blood work today. Has a F/U appointment with Gastroenterology  3. CKD: Stage 4: Nephrology Following 4. HTN: Continue current medication regimen: PCP Following.  5. Fluid Retention: Continue Furosemide: Cardiology Following.  6. DM: Continue current medication. PCP Following.  7. Iron Deficiency Anemia: Continue current medication regimen. PCP/ GI  Following.    20 minutes of face to face patient care time was spent during this visit. All questions were encouraged and answered.  F/U in  1 month

## 2018-07-03 ENCOUNTER — Telehealth: Payer: Self-pay | Admitting: *Deleted

## 2018-07-03 NOTE — Telephone Encounter (Signed)
Ulice Dash [facility] with [facility] reported that labs ere drawn yesterday on Erika Fry (CBC,BMP, Renal panel) and taken to [place] and should be sent to the office.

## 2018-07-04 LAB — BASIC METABOLIC PANEL
BUN/Creatinine Ratio: 18 (ref 12–28)
BUN: 48 mg/dL — ABNORMAL HIGH (ref 8–27)
CO2: 18 mmol/L — AB (ref 20–29)
Calcium: 9.7 mg/dL (ref 8.7–10.3)
Chloride: 90 mmol/L — ABNORMAL LOW (ref 96–106)
Creatinine, Ser: 2.62 mg/dL — ABNORMAL HIGH (ref 0.57–1.00)
GFR calc Af Amer: 20 mL/min/{1.73_m2} — ABNORMAL LOW (ref >59)
GFR calc non Af Amer: 17 mL/min/{1.73_m2} — ABNORMAL LOW (ref >59)
Glucose: 113 mg/dL — ABNORMAL HIGH (ref 65–99)
Potassium: 3.8 mmol/L (ref 3.5–5.2)
Sodium: 134 mmol/L (ref 134–144)

## 2018-07-04 LAB — SPECIMEN STATUS REPORT

## 2018-07-07 LAB — SPECIMEN STATUS REPORT

## 2018-07-07 LAB — FERRITIN: FERRITIN: 282 ng/mL — AB (ref 15–150)

## 2018-07-20 ENCOUNTER — Encounter: Payer: Self-pay | Admitting: Internal Medicine

## 2018-07-20 ENCOUNTER — Telehealth (HOSPITAL_COMMUNITY): Payer: Self-pay

## 2018-07-20 ENCOUNTER — Ambulatory Visit (INDEPENDENT_AMBULATORY_CARE_PROVIDER_SITE_OTHER): Payer: Medicare Other | Admitting: Internal Medicine

## 2018-07-20 VITALS — BP 121/69 | HR 67 | Temp 98.0°F

## 2018-07-20 DIAGNOSIS — M4646 Discitis, unspecified, lumbar region: Secondary | ICD-10-CM

## 2018-07-20 NOTE — Telephone Encounter (Signed)
Called to schedule catheter removal, no answer, left vm. AW  

## 2018-07-20 NOTE — Progress Notes (Signed)
RFV: follow up for discitis Patient ID: Erika Fry, female   DOB: May 15, 1946, 72 y.o.   MRN: [id number]  HPI Erika Fry is a 72yo F with being treated for discitis, received 6 wk of  ceftriaxone plus daptomycin through July 24th. Given a few weeks of oral abtx. She is now here in follow up. Has noticed increasing right hip pain in the last few weeks. Still continues to improve with her PT. Still has line in place but not been using other than weekly dressing changes and flushes. Denies fever chills nightsweats Outpatient Encounter Medications as of 07/20/2018  Medication Sig  . acetaminophen (TYLENOL) 325 MG tablet Take 2 tablets (650 mg total) by mouth 4 (four) times daily -  before meals and at bedtime.  [name] Kitchen allopurinol (ZYLOPRIM) 100 MG tablet Take 2 tablets (200 mg total) by mouth daily.  [name] Kitchen amLODipine (NORVASC) 10 MG tablet Take 1 tablet (10 mg total) by mouth daily.  . cetirizine (ZYRTEC) 10 MG tablet Take 10 mg by mouth at bedtime.   . ferrous sulfate 325 (65 FE) MG tablet Take 1 tablet (325 mg total) by mouth 2 (two) times daily with a meal.  . fluticasone (FLONASE) 50 MCG/ACT nasal spray Place 2 sprays into both nostrils daily as needed for allergies or rhinitis.  . folic acid (FOLVITE) 1 MG tablet Take 1 tablet (1 mg total) by mouth daily.  . furosemide (LASIX) 80 MG tablet Take 1 tablet (80 mg total) by mouth 2 (two) times daily. (Patient taking differently: Take 80 mg by mouth daily. )  . gabapentin (NEURONTIN) 400 MG capsule at bedtime.   . hydrALAZINE (APRESOLINE) 50 MG tablet Take 1.5 tablets (75 mg total) by mouth 3 (three) times daily. (Patient taking differently: Take 50 mg by mouth 3 (three) times daily. )  . Insulin Glargine (LANTUS) 100 UNIT/ML Solostar Pen Inject 20 Units into the skin daily.  . Insulin Pen Needle 31G X 6 MM MISC 1 application by Does not apply route daily.  [name] Kitchen levothyroxine (SYNTHROID, LEVOTHROID) 75 MCG tablet Take 75 mcg by mouth daily  before breakfast.  . lidocaine (LIDODERM) 5 % Place 1 patch onto the skin daily. Available over the counter  . Melatonin 3 MG TABS Take 0.5 tablets (1.5 mg total) by mouth at bedtime.  . Multiple Vitamin (MULTI-VITAMIN PO) Take 1 tablet by mouth daily.  . potassium chloride (K-DUR) 10 MEQ tablet Take 1 tablet (10 mEq total) by mouth 3 (three) times daily.  . simvastatin (ZOCOR) 40 MG tablet Take 0.5 tablets (20 mg total) by mouth at bedtime.  [name] Kitchen tiZANidine (ZANAFLEX) 2 MG tablet Take 1 tablet (2 mg total) by mouth daily.  . vitamin B-12 (CYANOCOBALAMIN) 1000 MCG tablet Take 1 tablet (1,000 mcg total) by mouth daily.  [name] Kitchen linezolid (ZYVOX) 600 MG tablet Take 1 tablet (600 mg total) by mouth every 12 (twelve) hours.  [name] Kitchen oxyCODONE (OXY IR/ROXICODONE) 5 MG immediate release tablet Take 1 tablet (5 mg total) by mouth every 6 (six) hours as needed for severe pain. (Patient not taking: Reported on 07/20/2018)  . pantoprazole (PROTONIX) 40 MG tablet Take 1 tablet (40 mg total) by mouth daily at 6 (six) AM. (Patient not taking: Reported on 07/20/2018)  . polyethylene glycol (MIRALAX / GLYCOLAX) packet Take 17 g by mouth daily as needed. (Patient not taking: Reported on 07/20/2018)  . senna (SENOKOT) 8.6 MG TABS tablet Take 1 tablet (8.6 mg total) by mouth daily. (Patient not taking: Reported  on 07/20/2018)  . [DISCONTINUED] traMADol (ULTRAM) 50 MG tablet Take 1 tablet (50 mg total) by mouth every 6 (six) hours as needed for moderate pain. (Patient not taking: Reported on 07/20/2018)   No facility-administered encounter medications on file as of 07/20/2018.      Patient Active Problem List   Diagnosis Date Noted  . Fluid retention   . Generalized edema   . Hypokalemia   . Acute right-sided low back pain with right-sided sciatica   . Acute blood loss anemia   . Anemia of chronic disease   . Thrombocytopenia ([place])   . Diabetes mellitus ([place])   . Morbid obesity ([place])   . Benign essential HTN   .  Hypoalbuminemia due to protein-calorie malnutrition ([place])   . Occult blood in stools   . Gastric polyp   . Lumbar discitis 05/17/2018  . Blood loss anemia 05/12/2018  . Hyperlipidemia associated with type 2 diabetes mellitus ([place]) 05/10/2018  . Essential hypertension 04/29/2018  . CKD (chronic kidney disease), stage IV ([place])   . Diet-controlled diabetes mellitus ([place])   . GERD (gastroesophageal reflux disease)   . Gout   . High cholesterol   . Hypothyroidism   . Iron deficiency anemia   . AV block, Mobitz 2 03/25/2018  . Mobitz type 2 second degree AV block 03/20/2018  . Aortic stenosis 03/20/2018  . Exertional dyspnea 03/19/2018     Health Maintenance Due  Topic Date Due  . Hepatitis C Screening  [birth year]  . FOOT EXAM  04/16/1954  . OPHTHALMOLOGY EXAM  04/16/1954  . TETANUS/TDAP  04/17/1963  . MAMMOGRAM  04/16/1994  . COLONOSCOPY  04/16/1994  . DEXA SCAN  04/16/2009  . INFLUENZA VACCINE  07/02/2018     Review of Systems 12 point ros is negative, still global weakness, continues to do PT Physical Exam   BP 121/69   Pulse 67   Temp 98 F (36.7 C) (Oral)   Physical Exam  Constitutional:  oriented to person, place, and time. appears deconditioned. No distress.  HENT: [place]/AT, PERRLA, no scleral icterus Mouth/Throat: Oropharynx is clear and moist. No oropharyngeal exudate.  Cardiovascular: Normal rate, regular rhythm and normal heart sounds. Exam reveals no gallop and no friction rub.  No murmur heard.  Pulmonary/Chest: Effort normal and breath sounds normal. No respiratory distress.  has no wheezes.  Neck = supple, no nuchal rigidity Lymphadenopathy: no cervical adenopathy. No axillary adenopathy Neurological: alert and oriented to person, place, and time.  Skin: Skin is warm and dry. No rash noted. No erythema.  Psychiatric: a normal mood and affect.  behavior is normal.   CBC Lab Results  Component Value Date   WBC 9.0 06/22/2018   RBC 2.77 (L) 06/22/2018    HGB 8.3 (L) 06/22/2018   HCT 26.5 (L) 06/22/2018   PLT 160 06/22/2018   MCV 95.7 06/22/2018   MCH 30.0 06/22/2018   MCHC 31.3 06/22/2018   RDW 18.9 (H) 06/22/2018   LYMPHSABS 0.8 06/18/2018   MONOABS 0.5 06/18/2018   EOSABS 0.0 06/18/2018    BMET Lab Results  Component Value Date   NA 134 07/02/2018   K 3.8 07/02/2018   CL 90 (L) 07/02/2018   CO2 18 (L) 07/02/2018   GLUCOSE 113 (H) 07/02/2018   BUN 48 (H) 07/02/2018   CREATININE 2.62 (H) 07/02/2018   CALCIUM 9.7 07/02/2018   GFRNONAA 17 (L) 07/02/2018   GFRAA 20 (L) 07/02/2018    Lab Results  Component Value Date  ESRSEDRATE >130 (H) 07/20/2018   Lab Results  Component Value Date   CRP 14.8 (H) 07/20/2018     Assessment and Plan  Discitis = if inflammatory markers are still elevated, will plan to extend oral abtx and see back in 4 wk. Will check sed rate and crp.  Central line removal -

## 2018-07-21 ENCOUNTER — Telehealth: Payer: Self-pay | Admitting: *Deleted

## 2018-07-21 LAB — C-REACTIVE PROTEIN: CRP: 14.8 mg/L — AB (ref <8.0)

## 2018-07-21 LAB — SEDIMENTATION RATE: Sed Rate: >130 mm/h — ABNORMAL HIGH (ref 0–30)

## 2018-07-21 NOTE — Telephone Encounter (Signed)
RN left message in IR asking for them to call patient to schedule removal of tunneled PICC.  Dr Baxter Flattery placed order into the work queue.  RN followed up today, spoke with Caryl Pina and asked her to speak with patient's emergency contact Anderson Malta (daughter in law) to arrange this. Landis Gandy, RN

## 2018-07-23 ENCOUNTER — Ambulatory Visit (HOSPITAL_COMMUNITY)
Admission: RE | Admit: 2018-07-23 | Discharge: 2018-07-23 | Disposition: A | Discharge status: Home / Self Care | Payer: Medicare Other | Source: Ambulatory Visit | Attending: Internal Medicine | Admitting: Internal Medicine

## 2018-07-23 ENCOUNTER — Encounter (HOSPITAL_COMMUNITY): Payer: Self-pay | Admitting: Radiology

## 2018-07-23 DIAGNOSIS — M4640 Discitis, unspecified, site unspecified: Secondary | ICD-10-CM | POA: Insufficient documentation

## 2018-07-23 DIAGNOSIS — M4646 Discitis, unspecified, lumbar region: Secondary | ICD-10-CM

## 2018-07-23 DIAGNOSIS — Z452 Encounter for adjustment and management of vascular access device: Secondary | ICD-10-CM | POA: Insufficient documentation

## 2018-07-23 HISTORY — PX: IR REMOVAL TUN CV CATH W/O FL: IMG2289

## 2018-07-23 MED ORDER — CHLORHEXIDINE GLUCONATE 4 % EX LIQD
CUTANEOUS | Status: AC
  Filled 2018-07-23: qty 15

## 2018-07-23 MED ORDER — CHLORHEXIDINE GLUCONATE 4 % EX LIQD
CUTANEOUS | Status: DC | PRN
  Administered 2018-07-23: 1 via TOPICAL

## 2018-07-23 MED ORDER — LIDOCAINE HCL 1 % IJ SOLN
INTRAMUSCULAR | Status: AC
  Filled 2018-07-23: qty 20

## 2018-07-23 MED ORDER — LIDOCAINE HCL 1 % IJ SOLN
INTRAMUSCULAR | Status: DC | PRN
  Administered 2018-07-23: 5 mL

## 2018-07-23 NOTE — Procedures (Signed)
Successful removal of tunneled (R)IJ CVC No complications.  Ascencion Dike PA-C Interventional Radiology 07/23/2018 11:48 AM

## 2018-07-24 ENCOUNTER — Ambulatory Visit: Payer: Medicare Other | Admitting: Gastroenterology

## 2018-07-24 ENCOUNTER — Telehealth: Payer: Self-pay | Admitting: *Deleted

## 2018-07-24 NOTE — Telephone Encounter (Signed)
Patient called to get the results of her recent lab work. Advised her of the totals and she asked what her next steps are. Advised her will ask the provider and give her a call back.

## 2018-07-27 NOTE — Telephone Encounter (Signed)
I left VM about the plan. Can you send in rx for doxycline 100mg  po bid take on full stomach. In addition, will plan on repeat mri of lumbar spine wo contrast.

## 2018-07-28 ENCOUNTER — Other Ambulatory Visit: Payer: Self-pay | Admitting: Internal Medicine

## 2018-07-28 MED ORDER — DOXYCYCLINE HYCLATE 100 MG PO TABS: 100.0000 mg | ORAL_TABLET | Freq: Two times a day (BID) | ORAL | 1 refills | Status: DC

## 2018-07-28 NOTE — Telephone Encounter (Signed)
How long do you want her to take the doxycycline?

## 2018-07-28 NOTE — Telephone Encounter (Signed)
I just sent it in. Can you call the pharmacy to see that they got it. Pt left VM stating that her pharmacy has had issues

## 2018-07-28 NOTE — Telephone Encounter (Signed)
Spoke with pharmacy and patient.  Please enter order for imaging you'd like, I will follow up with Caryl Pina.

## 2018-07-30 ENCOUNTER — Encounter (HOSPITAL_BASED_OUTPATIENT_CLINIC_OR_DEPARTMENT_OTHER): Payer: Medicare Other | Admitting: Physical Medicine & Rehabilitation

## 2018-07-30 ENCOUNTER — Encounter: Payer: Self-pay | Admitting: Physical Medicine & Rehabilitation

## 2018-07-30 VITALS — BP 96/60 | HR 66 | Ht 65.0 in | Wt 175.0 lb

## 2018-07-30 DIAGNOSIS — M4646 Discitis, unspecified, lumbar region: Secondary | ICD-10-CM

## 2018-07-30 DIAGNOSIS — I10 Essential (primary) hypertension: Secondary | ICD-10-CM | POA: Diagnosis not present

## 2018-07-30 DIAGNOSIS — R269 Unspecified abnormalities of gait and mobility: Secondary | ICD-10-CM

## 2018-07-30 DIAGNOSIS — N184 Chronic kidney disease, stage 4 (severe): Secondary | ICD-10-CM | POA: Diagnosis not present

## 2018-07-30 DIAGNOSIS — E0822 Diabetes mellitus due to underlying condition with diabetic chronic kidney disease: Secondary | ICD-10-CM

## 2018-07-30 DIAGNOSIS — D62 Acute posthemorrhagic anemia: Secondary | ICD-10-CM | POA: Diagnosis not present

## 2018-07-30 NOTE — Progress Notes (Signed)
Subjective:    Patient ID: Erika Fry, female    DOB: Apr 26, 1946, 72 y.o.   MRN: [id number]  HPI 72 year old female with history of hypertension, CKD, anemia of chronic disease, diet-controlled diabetes mellitus, permanent pacemaker presents for follow up for debility.   Last clinic visit on 07/02/18 by NP.  Notes reviewed.  Since that time, pt notes persistent infection and she is on abx. She is doing well with therapies, which she is getting 2/week. Pain is exacerbated with ambulation. She states she has not had her Hb checked. CBGs have been controlled. BP is noted to be low today. She has not weighed herself recently. Sleep has improved. She cancelled her GI appointment because she she thinks he wants  a colonoscopy, but is not ready for that at this time. Denies falls.  Pain Inventory Average Pain 6 Pain Right Now 6 My pain is intermittent  In the last 24 hours, has pain interfered with the following? General activity 6 Relation with others 6 Enjoyment of life 6 What TIME of day is your pain at its worst? daytime Sleep (in general) Fair  Pain is worse with: standing Pain improves with: medication Relief from Meds: 3  Mobility use a cane use a walker ability to climb steps?  yes do you drive?  no  Function retired  Neuro/Psych No problems in this area  Prior Studies Any changes since last visit?  no  Physicians involved in your care Any changes since last visit?  no   Family History  Problem Relation Age of Onset  . Hypertension Mother   . Diabetes Mellitus II Father   . Heart disease Father   . Gastric cancer Brother   . Diabetes Mellitus II Brother    Social History   Socioeconomic History  . Marital status: Widowed    Spouse name: Not on file  . Number of children: Not on file  . Years of education: Not on file  . Highest education level: Not on file  Occupational History  . Not on file  Social Needs  . Financial resource strain: Not on  file  . Food insecurity:    Worry: Not on file    Inability: Not on file  . Transportation needs:    Medical: Not on file    Non-medical: Not on file  Tobacco Use  . Smoking status: Never Smoker  . Smokeless tobacco: Never Used  Substance and Sexual Activity  . Alcohol use: Not Currently  . Drug use: Never  . Sexual activity: Not Currently  Lifestyle  . Physical activity:    Days per week: Not on file    Minutes per session: Not on file  . Stress: Not on file  Relationships  . Social connections:    Talks on phone: Not on file    Gets together: Not on file    Attends religious service: Not on file    Active member of club or organization: Not on file    Attends meetings of clubs or organizations: Not on file    Relationship status: Not on file  Other Topics Concern  . Not on file  Social History Narrative  . Not on file   Past Surgical History:  Procedure Laterality Date  . APPENDECTOMY    . BIOPSY  05/19/2018   Procedure: BIOPSY;  Surgeon: Ladene Artist, MD;  Location: [facility] ENDOSCOPY;  Service: Endoscopy;;  . ESOPHAGOGASTRODUODENOSCOPY N/A 05/19/2018   Procedure: ESOPHAGOGASTRODUODENOSCOPY (EGD);  Surgeon: Lucio Edward  T, MD;  Location: [place] ENDOSCOPY;  Service: Endoscopy;  Laterality: N/A;  . INSERT / REPLACE / REMOVE PACEMAKER  03/25/2018  . IR FLUORO GUIDE CV LINE RIGHT  05/18/2018  . IR LUMBAR DISC ASPIRATION W/IMG GUIDE  05/15/2018  . IR REMOVAL TUN CV CATH W/O FL  07/23/2018  . IR US GUIDE VASC ACCESS RIGHT  05/18/2018  . KNEE ARTHROSCOPY Bilateral   . PACEMAKER IMPLANT N/A 03/25/2018   Procedure: PACEMAKER IMPLANT;  Surgeon: Evans Lance, MD;  Location: [place] CV LAB;  Service: Cardiovascular;  Laterality: N/A;  . PACEMAKER PLACEMENT Left 03/2018  . RIGHT HEART CATH N/A 03/20/2018   Procedure: RIGHT HEART CATH;  Surgeon: Nigel Mormon, MD;  Location: [place] CV LAB;  Service: Cardiovascular;  Laterality: N/A;  . TEE WITHOUT CARDIOVERSION N/A  03/20/2018   Procedure: TRANSESOPHAGEAL ECHOCARDIOGRAM (TEE);  Surgeon: Nigel Mormon, MD;  Location: [facility] ENDOSCOPY;  Service: Cardiovascular;  Laterality: N/A;  . TONSILLECTOMY     Past Medical History:  Diagnosis Date  . Arthritis    "knees, thumbs" (03/25/2018)  . CKD (chronic kidney disease), stage IV ([place])   . Diet-controlled diabetes mellitus ([place])   . GERD (gastroesophageal reflux disease)   . Gout    "on daily RX" (03/25/2018)  . Heart murmur   . High cholesterol   . Hypertension   . Hypothyroidism   . Iron deficiency anemia    "had to get an iron infusion"  . Migraine    "used to have them growing up" (03/25/2018)  . Presence of permanent cardiac pacemaker 03/25/2018   BP 96/60 (BP Location: Right Wrist, Patient Position: Sitting, Cuff Size: Normal)   Pulse 66   Ht 5\' 5"  (1.651 m)   Wt 175 lb (79.4 kg)   SpO2 96%   BMI 29.12 kg/m   Opioid Risk Score:   Fall Risk Score:  `1  Depression screen PHQ 2/9  No flowsheet data found.  Review of Systems  Constitutional: Negative.   HENT: Negative.   Eyes: Negative.   Respiratory: Negative.   Cardiovascular: Negative.   Gastrointestinal: Negative.   Endocrine: Negative.   Genitourinary: Negative.   Musculoskeletal: Positive for arthralgias, back pain, gait problem and myalgias.  Skin: Negative.   Allergic/Immunologic: Negative.   Hematological: Negative.   Psychiatric/Behavioral: Negative.       Objective:   Physical Exam Constitutional: No distress . Vital signs reviewed. HENT: Normocephalic.  Atraumatic. Eyes: EOMI. No discharge. Cardiovascular: RRR. No JVD. Respiratory: CTA Bilaterally. Normal effort. GI: BS +. Non-distended. Musc: No edema or tenderness in extremities. Neuro: Alert Motor:  Bilateral upper extremities: 5/5 proximal distal (stable) Bilateral lower extremities: Hip flexion 4/5, knee extension 4/5, ankle dorsiflexion 5/5  Skin: Warm and dry. Intact. Psych: Normal mood.  Normal  behavior.    Assessment & Plan:  72 year old female with history of hypertension, CKD, anemia of chronic disease, diet-controlled diabetes mellitus, permanent pacemaker presents for follow up for debility.   1. Lumbar Discitis with debility:   Persistent, abx resumed by ID, cont ID recs  Cont therapies  2. Acute Blood Loss Anemia:   Needs to follow up for lab work  3. CKD: Stage 4:   Cont to follow up with Nephrology   4. HTN:   Hypotensive today with tachycardia, likely fluid related  Encouraged water intake  5. Pain Management:   Cont prn meds  Controlled at present  6. History of GIB   Follow up with GI, needs  appointment, states she will make one when she feels she can handle a colonoscopy  7. CHF  Cont follow up with Cards  8. Essential hypertension/Tachycardia             Cont meds, may need to decrease if BP low  Encouraged adequate fluid intake  9. Gait abnormality  Walker at [employer] in community

## 2018-08-17 ENCOUNTER — Encounter: Payer: Self-pay | Admitting: Internal Medicine

## 2018-08-17 ENCOUNTER — Ambulatory Visit (INDEPENDENT_AMBULATORY_CARE_PROVIDER_SITE_OTHER): Payer: Medicare Other | Admitting: Internal Medicine

## 2018-08-17 VITALS — BP 104/64 | HR 71 | Temp 98.3°F | Ht 65.0 in | Wt 177.0 lb

## 2018-08-17 DIAGNOSIS — M4646 Discitis, unspecified, lumbar region: Secondary | ICD-10-CM

## 2018-08-17 DIAGNOSIS — Z23 Encounter for immunization: Secondary | ICD-10-CM

## 2018-08-17 DIAGNOSIS — D509 Iron deficiency anemia, unspecified: Secondary | ICD-10-CM | POA: Diagnosis not present

## 2018-08-17 NOTE — Progress Notes (Signed)
Rfv; lumbar discitis  Patient ID: Erika Fry, female   DOB: 1946-04-14, 72 y.o.   MRN: [id number]  HPI Making steady progress with Pt. Starting to use a cane. But usually still using walker at home. Has twice a week pt. Still making progress with each session per her therapist. She states that starting on doxy has helped.  Outpatient Encounter Medications as of 08/17/2018  Medication Sig  . acetaminophen (TYLENOL) 325 MG tablet Take 2 tablets (650 mg total) by mouth 4 (four) times daily -  before meals and at bedtime.  [name] Kitchen allopurinol (ZYLOPRIM) 100 MG tablet Take 2 tablets (200 mg total) by mouth daily.  [name] Kitchen amLODipine (NORVASC) 10 MG tablet Take 1 tablet (10 mg total) by mouth daily.  . cetirizine (ZYRTEC) 10 MG tablet Take 10 mg by mouth at bedtime.   [name] Kitchen doxycycline (VIBRA-TABS) 100 MG tablet Take 1 tablet (100 mg total) by mouth 2 (two) times daily.  . ferrous sulfate 325 (65 FE) MG tablet Take 1 tablet (325 mg total) by mouth 2 (two) times daily with a meal.  . fluticasone (FLONASE) 50 MCG/ACT nasal spray Place 2 sprays into both nostrils daily as needed for allergies or rhinitis.  . folic acid (FOLVITE) 1 MG tablet Take 1 tablet (1 mg total) by mouth daily.  . furosemide (LASIX) 80 MG tablet Take 1 tablet (80 mg total) by mouth 2 (two) times daily. (Patient taking differently: Take 80 mg by mouth daily. )  . gabapentin (NEURONTIN) 400 MG capsule at bedtime.   . Insulin Glargine (LANTUS) 100 UNIT/ML Solostar Pen Inject 20 Units into the skin daily.  . Insulin Pen Needle 31G X 6 MM MISC 1 application by Does not apply route daily.  [name] Kitchen levothyroxine (SYNTHROID, LEVOTHROID) 75 MCG tablet Take 75 mcg by mouth daily before breakfast.  . lidocaine (LIDODERM) 5 % Place 1 patch onto the skin daily. Available over the counter  . Melatonin 3 MG TABS Take 0.5 tablets (1.5 mg total) by mouth at bedtime.  . Multiple Vitamin (MULTI-VITAMIN PO) Take 1 tablet by mouth daily.  [name] Kitchen oxyCODONE (OXY  IR/ROXICODONE) 5 MG immediate release tablet Take 1 tablet (5 mg total) by mouth every 6 (six) hours as needed for severe pain.  . pantoprazole (PROTONIX) 40 MG tablet Take 1 tablet (40 mg total) by mouth daily at 6 (six) AM.  . polyethylene glycol (MIRALAX / GLYCOLAX) packet Take 17 g by mouth daily as needed.  . potassium chloride (K-DUR) 10 MEQ tablet Take 1 tablet (10 mEq total) by mouth 3 (three) times daily.  [name] Kitchen senna (SENOKOT) 8.6 MG TABS tablet Take 1 tablet (8.6 mg total) by mouth daily.  . simvastatin (ZOCOR) 40 MG tablet Take 0.5 tablets (20 mg total) by mouth at bedtime.  [name] Kitchen tiZANidine (ZANAFLEX) 2 MG tablet Take 1 tablet (2 mg total) by mouth daily.  . vitamin B-12 (CYANOCOBALAMIN) 1000 MCG tablet Take 1 tablet (1,000 mcg total) by mouth daily.  . hydrALAZINE (APRESOLINE) 50 MG tablet Take 1.5 tablets (75 mg total) by mouth 3 (three) times daily. (Patient not taking: Reported on 08/17/2018)   No facility-administered encounter medications on file as of 08/17/2018.      Patient Active Problem List   Diagnosis Date Noted  . Fluid retention   . Generalized edema   . Hypokalemia   . Acute right-sided low back pain with right-sided sciatica   . Acute blood loss anemia   . Anemia of chronic disease   .  Thrombocytopenia ([place])   . Diabetes mellitus ([place])   . Morbid obesity ([place])   . Benign essential HTN   . Hypoalbuminemia due to protein-calorie malnutrition ([place])   . Occult blood in stools   . Gastric polyp   . Lumbar discitis 05/17/2018  . Blood loss anemia 05/12/2018  . Hyperlipidemia associated with type 2 diabetes mellitus ([place]) 05/10/2018  . Essential hypertension 04/29/2018  . CKD (chronic kidney disease), stage IV ([place])   . Diet-controlled diabetes mellitus ([place])   . GERD (gastroesophageal reflux disease)   . Gout   . High cholesterol   . Hypothyroidism   . Iron deficiency anemia   . AV block, Mobitz 2 03/25/2018  . Mobitz type 2 second degree AV block 03/20/2018  .  Aortic stenosis 03/20/2018  . Exertional dyspnea 03/19/2018     Health Maintenance Due  Topic Date Due  . Hepatitis C Screening  22-May-1946  . FOOT EXAM  04/16/1954  . OPHTHALMOLOGY EXAM  04/16/1954  . TETANUS/TDAP  04/17/1963  . MAMMOGRAM  04/16/1994  . COLONOSCOPY  04/16/1994  . DEXA SCAN  04/16/2009  . INFLUENZA VACCINE  07/02/2018     Review of Systems Per hpi otherwise 12 point ros is negative Physical Exam   BP 104/64   Pulse 71   Temp 98.3 F (36.8 C)   Ht 5\' 5"  (1.651 m)   Wt 177 lb (80.3 kg)   BMI 29.45 kg/m    Physical Exam  Constitutional:  oriented to person, place, and time. appears well-developed and well-nourished. No distress.  HENT: [place]/AT, PERRLA, no scleral icterus Mouth/Throat: Oropharynx is clear and moist. No oropharyngeal exudate.  Cardiovascular: Normal rate, regular rhythm and normal heart sounds. Exam reveals no gallop and no friction rub.  No murmur heard.  Pulmonary/Chest: Effort normal and breath sounds normal. No respiratory distress.  has no wheezes.  Neck = supple, no nuchal rigidity Abdominal: Soft. Bowel sounds are normal.  exhibits no distension. There is no tenderness.  Lymphadenopathy: no cervical adenopathy. No axillary adenopathy Neurological: alert and oriented to person, place, and time.  Skin: Skin is warm and dry. No rash noted. No erythema.  Psychiatric: a normal mood and affect.  behavior is normal.   CBC Lab Results  Component Value Date   WBC 9.0 06/22/2018   RBC 2.77 (L) 06/22/2018   HGB 8.3 (L) 06/22/2018   HCT 26.5 (L) 06/22/2018   PLT 160 06/22/2018   MCV 95.7 06/22/2018   MCH 30.0 06/22/2018   MCHC 31.3 06/22/2018   RDW 18.9 (H) 06/22/2018   LYMPHSABS 0.8 06/18/2018   MONOABS 0.5 06/18/2018   EOSABS 0.0 06/18/2018    BMET Lab Results  Component Value Date   NA 134 07/02/2018   K 3.8 07/02/2018   CL 90 (L) 07/02/2018   CO2 18 (L) 07/02/2018   GLUCOSE 113 (H) 07/02/2018   BUN 48 (H) 07/02/2018    CREATININE 2.62 (H) 07/02/2018   CALCIUM 9.7 07/02/2018   GFRNONAA 17 (L) 07/02/2018   GFRAA 20 (L) 07/02/2018   Lab Results  Component Value Date   ESRSEDRATE >130 (H) 08/17/2018   Lab Results  Component Value Date   CRP 17.3 (H) 08/17/2018   Lab Results  Component Value Date   HGB 9.6 (L) 08/17/2018    Assessment and Plan   Anemia = will check hgb. Improved from last month. Continue with iron supplementation  Discitis = will check inflam markers. Continue with doxy will need mri sans  contrast given her kidney function. Still quite elevated  Health maintenance= gave flu shot

## 2018-08-18 LAB — CBC WITH DIFFERENTIAL/PLATELET
BASOS ABS: 62 {cells}/uL (ref 0–200)
BASOS PCT: 0.7 %
EOS ABS: 418 {cells}/uL (ref 15–500)
Eosinophils Relative: 4.7 %
HCT: 28.2 % — ABNORMAL LOW (ref 35.0–45.0)
Hemoglobin: 9.6 g/dL — ABNORMAL LOW (ref 11.7–15.5)
Lymphs Abs: 2270 cells/uL (ref 850–3900)
MCH: 31.2 pg (ref 27.0–33.0)
MCHC: 34 g/dL (ref 32.0–36.0)
MCV: 91.6 fL (ref 80.0–100.0)
MONOS PCT: 7.3 %
MPV: 10.5 fL (ref 7.5–12.5)
Neutro Abs: 5500 cells/uL (ref 1500–7800)
Neutrophils Relative %: 61.8 %
PLATELETS: 207 10*3/uL (ref 140–400)
RBC: 3.08 10*6/uL — ABNORMAL LOW (ref 3.80–5.10)
RDW: 15 % (ref 11.0–15.0)
TOTAL LYMPHOCYTE: 25.5 %
WBC mixed population: 650 cells/uL (ref 200–950)
WBC: 8.9 10*3/uL (ref 3.8–10.8)

## 2018-08-18 LAB — BASIC METABOLIC PANEL
BUN / CREAT RATIO: 23 (calc) — AB (ref 6–22)
BUN: 65 mg/dL — AB (ref 7–25)
CO2: 29 mmol/L (ref 20–32)
Calcium: 10.4 mg/dL (ref 8.6–10.4)
Chloride: 94 mmol/L — ABNORMAL LOW (ref 98–110)
Creat: 2.84 mg/dL — ABNORMAL HIGH (ref 0.60–0.93)
GLUCOSE: 117 mg/dL — AB (ref 65–99)
Potassium: 4.4 mmol/L (ref 3.5–5.3)
SODIUM: 135 mmol/L (ref 135–146)

## 2018-08-18 LAB — C-REACTIVE PROTEIN: CRP: 17.3 mg/L — ABNORMAL HIGH (ref <8.0)

## 2018-08-18 LAB — SEDIMENTATION RATE

## 2018-08-20 ENCOUNTER — Encounter (HOSPITAL_COMMUNITY): Payer: Self-pay | Admitting: Radiology

## 2018-08-24 ENCOUNTER — Telehealth: Payer: Self-pay

## 2018-08-24 NOTE — Telephone Encounter (Signed)
Anda Kraft, PT from [facility] called requesting a continuous of PT for patient 2wk4 is this okay?

## 2018-08-24 NOTE — Telephone Encounter (Signed)
Yes. Thanks 

## 2018-08-24 NOTE — Telephone Encounter (Signed)
Anda Kraft has been notified.

## 2018-08-31 ENCOUNTER — Ambulatory Visit (HOSPITAL_COMMUNITY)
Admission: RE | Admit: 2018-08-31 | Discharge: 2018-08-31 | Disposition: A | Discharge status: Home / Self Care | Payer: Medicare Other | Source: Ambulatory Visit | Attending: Internal Medicine | Admitting: Internal Medicine

## 2018-08-31 DIAGNOSIS — M4646 Discitis, unspecified, lumbar region: Secondary | ICD-10-CM

## 2018-08-31 DIAGNOSIS — R600 Localized edema: Secondary | ICD-10-CM | POA: Insufficient documentation

## 2018-09-14 ENCOUNTER — Encounter: Payer: Self-pay | Admitting: Internal Medicine

## 2018-09-14 ENCOUNTER — Ambulatory Visit (INDEPENDENT_AMBULATORY_CARE_PROVIDER_SITE_OTHER): Payer: Medicare Other | Admitting: Internal Medicine

## 2018-09-14 VITALS — BP 144/79 | HR 71 | Temp 98.1°F | Ht 65.0 in | Wt 175.0 lb

## 2018-09-14 DIAGNOSIS — M4646 Discitis, unspecified, lumbar region: Secondary | ICD-10-CM | POA: Diagnosis not present

## 2018-09-14 DIAGNOSIS — N184 Chronic kidney disease, stage 4 (severe): Secondary | ICD-10-CM

## 2018-09-14 MED ORDER — DOXYCYCLINE HYCLATE 100 MG PO TABS: 100.0000 mg | ORAL_TABLET | Freq: Two times a day (BID) | ORAL | 3 refills | Status: DC

## 2018-09-14 NOTE — Progress Notes (Signed)
Patient ID: Erika Fry, female   DOB: Apr 28, 1946, 72 y.o.   MRN: [id number]  HPI 72yo F with histroy of discitis in June/july treated with ceftriaxone plus daptomycin and continued on doxycycline. Repeat mri showed some improvement in certain areas but worsening in others.Not having back pain. occ tightness to low back.Making progress in pt now being released to doing op PT  Raises feet up in the afternoon/evening to minimize pedal edema Outpatient Encounter Medications as of 09/14/2018  Medication Sig  . acetaminophen (TYLENOL) 325 MG tablet Take 2 tablets (650 mg total) by mouth 4 (four) times daily -  before meals and at bedtime.  [name] Kitchen allopurinol (ZYLOPRIM) 100 MG tablet Take 2 tablets (200 mg total) by mouth daily.  [name] Kitchen amLODipine (NORVASC) 10 MG tablet Take 1 tablet (10 mg total) by mouth daily.  . cetirizine (ZYRTEC) 10 MG tablet Take 10 mg by mouth at bedtime.   [name] Kitchen doxycycline (VIBRA-TABS) 100 MG tablet Take 1 tablet (100 mg total) by mouth 2 (two) times daily.  . ferrous sulfate 325 (65 FE) MG tablet Take 1 tablet (325 mg total) by mouth 2 (two) times daily with a meal.  . fluticasone (FLONASE) 50 MCG/ACT nasal spray Place 2 sprays into both nostrils daily as needed for allergies or rhinitis.  . folic acid (FOLVITE) 1 MG tablet Take 1 tablet (1 mg total) by mouth daily.  . furosemide (LASIX) 80 MG tablet Take 1 tablet (80 mg total) by mouth 2 (two) times daily. (Patient taking differently: Take 80 mg by mouth daily. )  . gabapentin (NEURONTIN) 400 MG capsule at bedtime.   . hydrALAZINE (APRESOLINE) 50 MG tablet Take 1.5 tablets (75 mg total) by mouth 3 (three) times daily.  . Insulin Glargine (LANTUS) 100 UNIT/ML Solostar Pen Inject 20 Units into the skin daily.  . Insulin Pen Needle 31G X 6 MM MISC 1 application by Does not apply route daily.  [name] Kitchen levothyroxine (SYNTHROID, LEVOTHROID) 75 MCG tablet Take 75 mcg by mouth daily before breakfast.  . lidocaine (LIDODERM) 5 % Place 1  patch onto the skin daily. Available over the counter  . Melatonin 3 MG TABS Take 0.5 tablets (1.5 mg total) by mouth at bedtime.  . Multiple Vitamin (MULTI-VITAMIN PO) Take 1 tablet by mouth daily.  [name] Kitchen oxyCODONE (OXY IR/ROXICODONE) 5 MG immediate release tablet Take 1 tablet (5 mg total) by mouth every 6 (six) hours as needed for severe pain.  . pantoprazole (PROTONIX) 40 MG tablet Take 1 tablet (40 mg total) by mouth daily at 6 (six) AM.  . polyethylene glycol (MIRALAX / GLYCOLAX) packet Take 17 g by mouth daily as needed.  . potassium chloride (K-DUR) 10 MEQ tablet Take 1 tablet (10 mEq total) by mouth 3 (three) times daily.  [name] Kitchen senna (SENOKOT) 8.6 MG TABS tablet Take 1 tablet (8.6 mg total) by mouth daily.  . simvastatin (ZOCOR) 40 MG tablet Take 0.5 tablets (20 mg total) by mouth at bedtime.  [name] Kitchen tiZANidine (ZANAFLEX) 2 MG tablet Take 1 tablet (2 mg total) by mouth daily.  . vitamin B-12 (CYANOCOBALAMIN) 1000 MCG tablet Take 1 tablet (1,000 mcg total) by mouth daily.   No facility-administered encounter medications on file as of 09/14/2018.      Patient Active Problem List   Diagnosis Date Noted  . Fluid retention   . Generalized edema   . Hypokalemia   . Acute right-sided low back pain with right-sided sciatica   . Acute blood loss  anemia   . Anemia of chronic disease   . Thrombocytopenia ([place])   . Diabetes mellitus ([place])   . Morbid obesity ([place])   . Benign essential HTN   . Hypoalbuminemia due to protein-calorie malnutrition ([place])   . Occult blood in stools   . Gastric polyp   . Lumbar discitis 05/17/2018  . Blood loss anemia 05/12/2018  . Hyperlipidemia associated with type 2 diabetes mellitus ([place]) 05/10/2018  . Essential hypertension 04/29/2018  . CKD (chronic kidney disease), stage IV ([place])   . Diet-controlled diabetes mellitus ([place])   . GERD (gastroesophageal reflux disease)   . Gout   . High cholesterol   . Hypothyroidism   . Iron deficiency anemia   . AV block,  Mobitz 2 03/25/2018  . Mobitz type 2 second degree AV block 03/20/2018  . Aortic stenosis 03/20/2018  . Exertional dyspnea 03/19/2018     Health Maintenance Due  Topic Date Due  . Hepatitis C Screening  1946/06/08  . FOOT EXAM  04/16/1954  . OPHTHALMOLOGY EXAM  04/16/1954  . TETANUS/TDAP  04/17/1963  . MAMMOGRAM  04/16/1994  . COLONOSCOPY  04/16/1994  . DEXA SCAN  04/16/2009    Social History   Tobacco Use  . Smoking status: Never Smoker  . Smokeless tobacco: Never Used  Substance Use Topics  . Alcohol use: Not Currently  . Drug use: Never   Review of Systems 12 point ros is negative, still has some lower extremity weakness and some back pain from discitis but improved Physical Exam   BP (!) 144/79   Pulse 71   Temp 98.1 F (36.7 C)   Ht 5\' 5"  (1.651 m)   Wt 175 lb (79.4 kg)   BMI 29.12 kg/m   Physical Exam  Constitutional:  oriented to person, place, and time. appears well-developed and well-nourished. No distress.  HENT: [place]/AT, PERRLA, no scleral icterus Mouth/Throat: Oropharynx is clear and moist. No oropharyngeal exudate.  Cardiovascular: Normal rate, regular rhythm and normal heart sounds. Exam reveals no gallop and no friction rub.  No murmur heard.  Pulmonary/Chest: Effort normal and breath sounds normal. No respiratory distress.  has no wheezes.  Neck = supple, no nuchal rigidity Abdominal: Soft. Bowel sounds are normal.  exhibits no distension. There is no tenderness.  Lymphadenopathy: no cervical adenopathy. No axillary adenopathy Neurological: alert and oriented to person, place, and time.  Skin: Skin is warm and dry. No rash noted. No erythema.  Psychiatric: a normal mood and affect.  behavior is normal.   CBC Lab Results  Component Value Date   WBC 8.9 08/17/2018   RBC 3.08 (L) 08/17/2018   HGB 9.6 (L) 08/17/2018   HCT 28.2 (L) 08/17/2018   PLT 207 08/17/2018   MCV 91.6 08/17/2018   MCH 31.2 08/17/2018   MCHC 34.0 08/17/2018   RDW 15.0  08/17/2018   LYMPHSABS 2,270 08/17/2018   MONOABS 0.5 06/18/2018   EOSABS 418 08/17/2018    BMET Lab Results  Component Value Date   NA 135 08/17/2018   K 4.4 08/17/2018   CL 94 (L) 08/17/2018   CO2 29 08/17/2018   GLUCOSE 117 (H) 08/17/2018   BUN 65 (H) 08/17/2018   CREATININE 2.84 (H) 08/17/2018   CALCIUM 10.4 08/17/2018   GFRNONAA 17 (L) 07/02/2018   GFRAA 20 (L) 07/02/2018      Assessment and Plan  Discitis = inflammatory markers still elevated but not sure if it is all due to infection. Will still continue with  doxycycline and give refills. Will check sed rate and crp Lab work to see if improved.  ckd 4 = will check bmp. ua  Will call back on wed if cr worsening to move up her renal appt. Seeing new doc that is picking up after dr Florene Glen.  rtc 2 months

## 2018-09-15 LAB — CBC WITH DIFFERENTIAL/PLATELET
BASOS PCT: 0.7 %
Basophils Absolute: 75 cells/uL (ref 0–200)
Eosinophils Absolute: 556 cells/uL — ABNORMAL HIGH (ref 15–500)
Eosinophils Relative: 5.2 %
HEMATOCRIT: 32.3 % — AB (ref 35.0–45.0)
Hemoglobin: 10.9 g/dL — ABNORMAL LOW (ref 11.7–15.5)
LYMPHS ABS: 2151 {cells}/uL (ref 850–3900)
MCH: 30.4 pg (ref 27.0–33.0)
MCHC: 33.7 g/dL (ref 32.0–36.0)
MCV: 90 fL (ref 80.0–100.0)
MPV: 10.5 fL (ref 7.5–12.5)
Monocytes Relative: 6.5 %
Neutro Abs: 7223 cells/uL (ref 1500–7800)
Neutrophils Relative %: 67.5 %
PLATELETS: 193 10*3/uL (ref 140–400)
RBC: 3.59 10*6/uL — ABNORMAL LOW (ref 3.80–5.10)
RDW: 14.1 % (ref 11.0–15.0)
TOTAL LYMPHOCYTE: 20.1 %
WBC: 10.7 10*3/uL (ref 3.8–10.8)
WBCMIX: 696 {cells}/uL (ref 200–950)

## 2018-09-15 LAB — URINALYSIS
BILIRUBIN URINE: NEGATIVE
GLUCOSE, UA: NEGATIVE
HGB URINE DIPSTICK: NEGATIVE
Ketones, ur: NEGATIVE
Nitrite: NEGATIVE
SPECIFIC GRAVITY, URINE: 1.011 (ref 1.001–1.03)

## 2018-09-15 LAB — BASIC METABOLIC PANEL
BUN/Creatinine Ratio: 24 (calc) — ABNORMAL HIGH (ref 6–22)
BUN: 68 mg/dL — AB (ref 7–25)
CHLORIDE: 98 mmol/L (ref 98–110)
CO2: 26 mmol/L (ref 20–32)
CREATININE: 2.81 mg/dL — AB (ref 0.60–0.93)
Calcium: 10.9 mg/dL — ABNORMAL HIGH (ref 8.6–10.4)
Glucose, Bld: 143 mg/dL — ABNORMAL HIGH (ref 65–99)
Potassium: 3.8 mmol/L (ref 3.5–5.3)
Sodium: 138 mmol/L (ref 135–146)

## 2018-09-15 LAB — MICROALBUMIN / CREATININE URINE RATIO
Creatinine, Urine: 50 mg/dL (ref 20–275)
MICROALB/CREAT RATIO: 450 ug/mg{creat} — AB (ref <30)
Microalb, Ur: 22.5 mg/dL

## 2018-09-15 LAB — SEDIMENTATION RATE: Sed Rate: >130 mm/h — ABNORMAL HIGH (ref 0–30)

## 2018-09-15 LAB — C-REACTIVE PROTEIN: CRP: 7 mg/L (ref <8.0)

## 2018-09-24 ENCOUNTER — Encounter: Payer: Self-pay | Admitting: Physical Medicine & Rehabilitation

## 2018-09-24 ENCOUNTER — Encounter: Payer: Medicare Other | Attending: Physical Medicine & Rehabilitation | Admitting: Physical Medicine & Rehabilitation

## 2018-09-24 ENCOUNTER — Other Ambulatory Visit: Payer: Self-pay

## 2018-09-24 VITALS — BP 132/84 | HR 80 | Ht 65.0 in | Wt 175.0 lb

## 2018-09-24 DIAGNOSIS — M4646 Discitis, unspecified, lumbar region: Secondary | ICD-10-CM | POA: Diagnosis not present

## 2018-09-24 DIAGNOSIS — E1122 Type 2 diabetes mellitus with diabetic chronic kidney disease: Secondary | ICD-10-CM | POA: Diagnosis not present

## 2018-09-24 DIAGNOSIS — I509 Heart failure, unspecified: Secondary | ICD-10-CM | POA: Diagnosis not present

## 2018-09-24 DIAGNOSIS — R269 Unspecified abnormalities of gait and mobility: Secondary | ICD-10-CM | POA: Diagnosis not present

## 2018-09-24 DIAGNOSIS — Z950 Presence of cardiac pacemaker: Secondary | ICD-10-CM | POA: Insufficient documentation

## 2018-09-24 DIAGNOSIS — E0822 Diabetes mellitus due to underlying condition with diabetic chronic kidney disease: Secondary | ICD-10-CM | POA: Diagnosis not present

## 2018-09-24 DIAGNOSIS — I10 Essential (primary) hypertension: Secondary | ICD-10-CM | POA: Diagnosis not present

## 2018-09-24 DIAGNOSIS — E7800 Pure hypercholesterolemia, unspecified: Secondary | ICD-10-CM | POA: Insufficient documentation

## 2018-09-24 DIAGNOSIS — M109 Gout, unspecified: Secondary | ICD-10-CM | POA: Diagnosis not present

## 2018-09-24 DIAGNOSIS — D509 Iron deficiency anemia, unspecified: Secondary | ICD-10-CM | POA: Insufficient documentation

## 2018-09-24 DIAGNOSIS — D62 Acute posthemorrhagic anemia: Secondary | ICD-10-CM | POA: Insufficient documentation

## 2018-09-24 DIAGNOSIS — R609 Edema, unspecified: Secondary | ICD-10-CM | POA: Diagnosis not present

## 2018-09-24 DIAGNOSIS — Z833 Family history of diabetes mellitus: Secondary | ICD-10-CM | POA: Diagnosis not present

## 2018-09-24 DIAGNOSIS — K219 Gastro-esophageal reflux disease without esophagitis: Secondary | ICD-10-CM | POA: Insufficient documentation

## 2018-09-24 DIAGNOSIS — I130 Hypertensive heart and chronic kidney disease with heart failure and stage 1 through stage 4 chronic kidney disease, or unspecified chronic kidney disease: Secondary | ICD-10-CM | POA: Insufficient documentation

## 2018-09-24 DIAGNOSIS — N184 Chronic kidney disease, stage 4 (severe): Secondary | ICD-10-CM

## 2018-09-24 DIAGNOSIS — E039 Hypothyroidism, unspecified: Secondary | ICD-10-CM | POA: Insufficient documentation

## 2018-09-24 DIAGNOSIS — R5381 Other malaise: Secondary | ICD-10-CM | POA: Insufficient documentation

## 2018-09-24 MED ORDER — GABAPENTIN 400 MG PO CAPS: 400.0000 mg | ORAL_CAPSULE | Freq: Every day | ORAL | 1 refills | Status: DC

## 2018-09-24 NOTE — Progress Notes (Signed)
Subjective:    Patient ID: Erika Fry, female    DOB: 07-28-1946, 72 y.o.   MRN: [id number]  HPI 72 year old female with history of hypertension, CKD, anemia of chronic disease, diet-controlled diabetes mellitus, permanent pacemaker presents for follow up for debility.   Last clinic visit on 07/30/18.  Since that time, pt states she is ambulating with a cane now and even without at times.  She completed HH and is now scheduled to go to outpatient PT. She is still on abx. She had repeat lab work.  She had a repeat MRI by ID. BP is controlled. She notes pain, particularly at night. She never followed up with GI and still does want colonoscopy, but will think about in the future.  Pain Inventory Average Pain 0 Pain Right Now 0 My pain is no pain  In the last 24 hours, has pain interfered with the following? General activity 0 Relation with others 0 Enjoyment of life 0 What TIME of day is your pain at its worst? no pain Sleep (in general) Good  Pain is worse with: no pain Pain improves with: no pain Relief from Meds: no meds  Mobility use a cane ability to climb steps?  yes do you drive?  yes  Function retired  Neuro/Psych No problems in this area  Prior Studies Any changes since last visit?  no  Physicians involved in your care Any changes since last visit?  no   Family History  Problem Relation Age of Onset  . Hypertension Mother   . Diabetes Mellitus II Father   . Heart disease Father   . Gastric cancer Brother   . Diabetes Mellitus II Brother    Social History   Socioeconomic History  . Marital status: Widowed    Spouse name: Not on file  . Number of children: Not on file  . Years of education: Not on file  . Highest education level: Not on file  Occupational History  . Not on file  Social Needs  . Financial resource strain: Not on file  . Food insecurity:    Worry: Not on file    Inability: Not on file  . Transportation needs:    Medical:  Not on file    Non-medical: Not on file  Tobacco Use  . Smoking status: Never Smoker  . Smokeless tobacco: Never Used  Substance and Sexual Activity  . Alcohol use: Not Currently  . Drug use: Never  . Sexual activity: Not Currently  Lifestyle  . Physical activity:    Days per week: Not on file    Minutes per session: Not on file  . Stress: Not on file  Relationships  . Social connections:    Talks on phone: Not on file    Gets together: Not on file    Attends religious service: Not on file    Active member of club or organization: Not on file    Attends meetings of clubs or organizations: Not on file    Relationship status: Not on file  Other Topics Concern  . Not on file  Social History Narrative  . Not on file   Past Surgical History:  Procedure Laterality Date  . APPENDECTOMY    . BIOPSY  05/19/2018   Procedure: BIOPSY;  Surgeon: Ladene Artist, MD;  Location: [facility] ENDOSCOPY;  Service: Endoscopy;;  . ESOPHAGOGASTRODUODENOSCOPY N/A 05/19/2018   Procedure: ESOPHAGOGASTRODUODENOSCOPY (EGD);  Surgeon: Ladene Artist, MD;  Location: [facility] ENDOSCOPY;  Service: Endoscopy;  Laterality: N/A;  . INSERT / REPLACE / REMOVE PACEMAKER  03/25/2018  . IR FLUORO GUIDE CV LINE RIGHT  05/18/2018  . IR LUMBAR DISC ASPIRATION W/IMG GUIDE  05/15/2018  . IR REMOVAL TUN CV CATH W/O FL  07/23/2018  . IR US GUIDE VASC ACCESS RIGHT  05/18/2018  . KNEE ARTHROSCOPY Bilateral   . PACEMAKER IMPLANT N/A 03/25/2018   Procedure: PACEMAKER IMPLANT;  Surgeon: Evans Lance, MD;  Location: [place] CV LAB;  Service: Cardiovascular;  Laterality: N/A;  . PACEMAKER PLACEMENT Left 03/2018  . RIGHT HEART CATH N/A 03/20/2018   Procedure: RIGHT HEART CATH;  Surgeon: Nigel Mormon, MD;  Location: [place] CV LAB;  Service: Cardiovascular;  Laterality: N/A;  . TEE WITHOUT CARDIOVERSION N/A 03/20/2018   Procedure: TRANSESOPHAGEAL ECHOCARDIOGRAM (TEE);  Surgeon: Nigel Mormon, MD;  Location: [facility]  ENDOSCOPY;  Service: Cardiovascular;  Laterality: N/A;  . TONSILLECTOMY     Past Medical History:  Diagnosis Date  . Arthritis    "knees, thumbs" (03/25/2018)  . CKD (chronic kidney disease), stage IV ([place])   . Diet-controlled diabetes mellitus ([place])   . GERD (gastroesophageal reflux disease)   . Gout    "on daily RX" (03/25/2018)  . Heart murmur   . High cholesterol   . Hypertension   . Hypothyroidism   . Iron deficiency anemia    "had to get an iron infusion"  . Migraine    "used to have them growing up" (03/25/2018)  . Presence of permanent cardiac pacemaker 03/25/2018   BP 132/84   Pulse 80   Ht 5\' 5"  (1.651 m)   Wt 175 lb (79.4 kg)   SpO2 98%   BMI 29.12 kg/m   Opioid Risk Score:   Fall Risk Score:  `1  Depression screen PHQ 2/9  Depression screen [facility] 2/9 09/24/2018 09/14/2018 08/17/2018  Decreased Interest 0 0 0  Down, Depressed, Hopeless 0 0 0  PHQ - 2 Score 0 0 0    Review of Systems  Constitutional: Negative.   HENT: Negative.   Eyes: Negative.   Respiratory: Negative.   Cardiovascular: Negative.   Gastrointestinal: Negative.   Endocrine: Negative.   Genitourinary: Negative.   Musculoskeletal: Positive for arthralgias, back pain, gait problem and myalgias.  Skin: Negative.   Allergic/Immunologic: Negative.   Hematological: Negative.   Psychiatric/Behavioral: Negative.   All other systems reviewed and are negative.     Objective:   Physical Exam Constitutional: No distress . Vital signs reviewed. HENT: Normocephalic.  Atraumatic. Eyes: EOMI. No discharge. Cardiovascular: RRR. No JVD. Respiratory: CTA bilaterally. Normal effort. GI: BS +. Non-distended. Musc: No edema or tenderness in extremities. Neuro: Alert Motor:  Bilateral upper extremities: 5/5 proximal distal  Bilateral lower extremities: Hip flexion 4-4+/5, knee extension 4+/5, ankle dorsiflexion 5/5  Skin: Warm and dry. Intact. Psych: Normal mood.  Normal behavior.    Assessment &  Plan:  72 year old female with history of hypertension, CKD, anemia of chronic disease, diet-controlled diabetes mellitus, permanent pacemaker presents for follow up for debility.   1. Lumbar Discitis with debility:   Cont abx per ID  Cont therapies, now outpatient  Discussed gradual return to driving  2. CKD: Stage 4:   Cont to follow up with Nephrology   3. Pain Management:   Cont prn meds  Trial Epson salt bath  Will add Gabapentin 400 qhs  4. History of GIB   Follow up with GI, needs appointment, states she will make one when  she feels she can handle a colonoscopy, still states she is going to wait  5. Gait abnormality  Cont therapies  Now using cane

## 2018-09-28 ENCOUNTER — Encounter: Payer: Self-pay | Admitting: Gastroenterology

## 2018-10-06 ENCOUNTER — Encounter: Payer: Self-pay | Admitting: Physical Therapy

## 2018-10-06 ENCOUNTER — Ambulatory Visit: Payer: Medicare Other | Attending: Family Medicine | Admitting: Physical Therapy

## 2018-10-06 DIAGNOSIS — R609 Edema, unspecified: Secondary | ICD-10-CM | POA: Diagnosis present

## 2018-10-06 DIAGNOSIS — R0602 Shortness of breath: Secondary | ICD-10-CM | POA: Diagnosis present

## 2018-10-06 DIAGNOSIS — R52 Pain, unspecified: Secondary | ICD-10-CM | POA: Diagnosis present

## 2018-10-06 DIAGNOSIS — M545 Low back pain, unspecified: Secondary | ICD-10-CM

## 2018-10-06 DIAGNOSIS — M6281 Muscle weakness (generalized): Secondary | ICD-10-CM

## 2018-10-06 DIAGNOSIS — M4646 Discitis, unspecified, lumbar region: Secondary | ICD-10-CM | POA: Insufficient documentation

## 2018-10-06 DIAGNOSIS — R262 Difficulty in walking, not elsewhere classified: Secondary | ICD-10-CM | POA: Diagnosis present

## 2018-10-07 NOTE — Therapy (Signed)
[place], [place], [address] Phone: [phone]   Fax:  [phone]  Physical Therapy Evaluation  Patient Details  Name: Erika Fry MRN: [id number] Date of Birth: 07/13/1946 Referring Provider (PT): Janus Molder, FNP   Encounter Date: 10/06/2018  PT End of Session - 10/06/18 1643    Visit Number  1    Date for PT Re-Evaluation  12/06/18    PT Start Time  8416    PT Stop Time  1700    PT Time Calculation (min)  46 min    Activity Tolerance  Patient tolerated treatment well    Behavior During Therapy  [facility] for tasks assessed/performed       Past Medical History:  Diagnosis Date  . Arthritis    "knees, thumbs" (03/25/2018)  . CKD (chronic kidney disease), stage IV ([place])   . Diet-controlled diabetes mellitus ([place])   . GERD (gastroesophageal reflux disease)   . Gout    "on daily RX" (03/25/2018)  . Heart murmur   . High cholesterol   . Hypertension   . Hypothyroidism   . Iron deficiency anemia    "had to get an iron infusion"  . Migraine    "used to have them growing up" (03/25/2018)  . Presence of permanent cardiac pacemaker 03/25/2018    Past Surgical History:  Procedure Laterality Date  . APPENDECTOMY    . BIOPSY  05/19/2018   Procedure: BIOPSY;  Surgeon: Ladene Artist, MD;  Location: [facility] ENDOSCOPY;  Service: Endoscopy;;  . ESOPHAGOGASTRODUODENOSCOPY N/A 05/19/2018   Procedure: ESOPHAGOGASTRODUODENOSCOPY (EGD);  Surgeon: Ladene Artist, MD;  Location[facility] ENDOSCOPY;  Service: Endoscopy;  Laterality: N/A;  . INSERT / REPLACE / REMOVE PACEMAKER  03/25/2018  . IR FLUORO GUIDE CV LINE RIGHT  05/18/2018  . IR LUMBAR DISC ASPIRATION W/IMG GUIDE  05/15/2018  . IR REMOVAL TUN CV CATH W/O FL  07/23/2018  . IR US GUIDE VASC ACCESS RIGHT  05/18/2018  . KNEE ARTHROSCOPY Bilateral   . PACEMAKER IMPLANT N/A 03/25/2018   Procedure: PACEMAKER IMPLANT;  Surgeon: Evans Lance, MD;  Location:  [place] CV LAB;  Service: Cardiovascular;  Laterality: N/A;  . PACEMAKER PLACEMENT Left 03/2018  . RIGHT HEART CATH N/A 03/20/2018   Procedure: RIGHT HEART CATH;  Surgeon: Nigel Mormon, MD;  Location: [place] CV LAB;  Service: Cardiovascular;  Laterality: N/A;  . TEE WITHOUT CARDIOVERSION N/A 03/20/2018   Procedure: TRANSESOPHAGEAL ECHOCARDIOGRAM (TEE);  Surgeon: Nigel Mormon, MD;  Location: [facility] ENDOSCOPY;  Service: Cardiovascular;  Laterality: N/A;  . TONSILLECTOMY      There were no vitals filed for this visit.   Subjective Assessment - 10/06/18 1621    Subjective  Patient reports that she was having back pain, it was found that she had an infection in the low back, she was put in the hospital and stayed for over a month, she was on antibiotics and reports that she could not walk, she also reports a new medicaiton that she was allergic to, she was in a coma for 3 days.  She was discharged from the hospital in July, she had home PT.  She was discharged from home PT a few weeks ago, she c/o leg weakness and difficulty walking    Pertinent History  CKD, GERD, pacemaker    Limitations  Standing;Walking;House hold activities    Patient Stated Goals  get stronger, walk better  Currently in Pain?  Yes    Pain Score  9     Pain Location  Back    Pain Descriptors / Indicators  Aching    Pain Type  Acute pain    Pain Radiating Towards  denies    Pain Onset  More than a month ago    Pain Frequency  Constant    Aggravating Factors   standing, cooking, walking, pain will be up to 10/10    Pain Relieving Factors  Tylenol, rest, at best the pain is a 1-2/10    Effect of Pain on Daily Activities  limits walking,                     Objective measurements completed on examination: See above findings.              PT Education - 10/06/18 1642    Education Details  encouraged her to continue with the exercises    Person(s) Educated  Patient    Methods   Explanation    Comprehension  Verbalized understanding       PT Short Term Goals - 10/07/18 9528      PT SHORT TERM GOAL #1   Title  give good HEP and have pateint do at home    Time  2    Period  Weeks    Status  New        PT Long Term Goals - 10/07/18 4132      PT LONG TERM GOAL #1   Title  reports able to go up and down stairs step over step    Time  8    Period  Weeks    Status  New      PT LONG TERM GOAL #2   Title  report pain decreased 25%    Time  8    Period  Weeks    Status  New      PT LONG TERM GOAL #3   Title  decrease TUG time to 13 seconds    Time  8    Period  Weeks    Status  New      PT LONG TERM GOAL #4   Title  increase lumbar ROM 25%    Time  8    Period  Weeks    Status  New             Plan - 10/06/18 1644    Clinical Impression Statement  Patient with multiple medical issues that made her ability to function difficult, she was in the hospital, in the a nursing facility due to weakness and the inability to walk in the past 5 months.  She reports that she is very weak and continues to have difficulty with stairs, walking and housework, she reports that her balance is "a little off"    History and Personal Factors relevant to plan of care:  CKD, pacemaker, GERD, DM    Clinical Presentation  Evolving    Clinical Decision Making  Moderate    Rehab Potential  Good    PT Frequency  2x / week    PT Duration  8 weeks    PT Treatment/Interventions  ADLs/Self Care Home Management;Moist Heat;Therapeutic activities;Therapeutic exercise;Balance training;Functional mobility training;Gait training;Stair training;Patient/family education;Manual techniques    PT Next Visit Plan  slowly add exercises for strength and function    Consulted and Agree with Plan of Care  Patient  Patient will benefit from skilled therapeutic intervention in order to improve the following deficits and impairments:  Abnormal gait, Decreased range of motion,  Difficulty walking, Increased muscle spasms, Cardiopulmonary status limiting activity, Decreased activity tolerance, Pain, Improper body mechanics, Impaired flexibility, Decreased balance, Decreased mobility, Decreased strength, Postural dysfunction  Visit Diagnosis: Muscle weakness (generalized) - Plan: PT plan of care cert/re-cert  Difficulty in walking, not elsewhere classified - Plan: PT plan of care cert/re-cert  Acute bilateral low back pain without sciatica - Plan: PT plan of care cert/re-cert     Problem List Patient Active Problem List   Diagnosis Date Noted  . Fluid retention   . Generalized edema   . Hypokalemia   . Acute right-sided low back pain with right-sided sciatica   . Acute blood loss anemia   . Anemia of chronic disease   . Thrombocytopenia ([place])   . Diabetes mellitus ([place])   . Morbid obesity ([place])   . Benign essential HTN   . Hypoalbuminemia due to protein-calorie malnutrition ([place])   . Occult blood in stools   . Gastric polyp   . Lumbar discitis 05/17/2018  . Blood loss anemia 05/12/2018  . Hyperlipidemia associated with type 2 diabetes mellitus ([place]) 05/10/2018  . Essential hypertension 04/29/2018  . CKD (chronic kidney disease), stage IV ([place])   . Diet-controlled diabetes mellitus ([place])   . GERD (gastroesophageal reflux disease)   . Gout   . High cholesterol   . Hypothyroidism   . Iron deficiency anemia   . AV block, Mobitz 2 03/25/2018  . Mobitz type 2 second degree AV block 03/20/2018  . Aortic stenosis 03/20/2018  . Exertional dyspnea 03/19/2018    Sumner Boast., PT 10/07/2018, 9:14 AM  [place] [address] [place], [place], [address] Phone: [phone]   Fax:  [phone]  Name: Erika Fry MRN: [id number] Date of Birth: [birth year]

## 2018-10-09 ENCOUNTER — Ambulatory Visit: Payer: Medicare Other | Admitting: Physical Therapy

## 2018-10-09 DIAGNOSIS — M545 Low back pain, unspecified: Secondary | ICD-10-CM

## 2018-10-09 DIAGNOSIS — R262 Difficulty in walking, not elsewhere classified: Secondary | ICD-10-CM

## 2018-10-09 DIAGNOSIS — M6281 Muscle weakness (generalized): Secondary | ICD-10-CM | POA: Diagnosis not present

## 2018-10-09 NOTE — Patient Instructions (Signed)
Knee High   Holding stable object, raise knee to hip level, then lower knee. Repeat with other knee. Complete __10_ repetitions. Do 1-3 sets of 10. Do __2__ sessions per day.  ABDUCTION: Standing (Active)   Stand, feet flat. Lift right leg out to side. Use _0__ lbs. Complete __10_ repetitions. Do 1-3 sets of 10.  Perform __2_ sessions per day.   EXTENSION: Standing (Active)  Stand, both feet flat. Draw right leg behind body as far as possible. Do not bend forward at the waist.  Use 0___ lbs. Complete 10 repetitions. Do 1-3 sets of 10. Perform __2_ sessions per day.  Copyright  VHI. All rights reserved.    Sit to Stand    Sit on edge of chair, feet flat on floor. Stand upright, extending knees fully. Repeat 10 times per set. Do _1-3 sets per session. Do __1-2__ sessions per day.   Madelyn Flavors, PT 10/09/18 10:01 AM [place] [address] [place], [place], [address] Phone: [phone]   Fax:  [phone]

## 2018-10-09 NOTE — Therapy (Signed)
[place], [place], [address] Phone: [phone]   Fax:  [phone]  Physical Therapy Treatment  Patient Details  Name: Erika Fry MRN: [id number] Date of Birth: Dec 31, 1945 Referring Provider (PT): Janus Molder, FNP   Encounter Date: 10/09/2018  PT End of Session - 10/09/18 0936    Visit Number  2    Date for PT Re-Evaluation  12/06/18    PT Start Time  0935    PT Stop Time  8182    PT Time Calculation (min)  39 min    Activity Tolerance  Patient tolerated treatment well       Past Medical History:  Diagnosis Date  . Arthritis    "knees, thumbs" (03/25/2018)  . CKD (chronic kidney disease), stage IV ([place])   . Diet-controlled diabetes mellitus ([place])   . GERD (gastroesophageal reflux disease)   . Gout    "on daily RX" (03/25/2018)  . Heart murmur   . High cholesterol   . Hypertension   . Hypothyroidism   . Iron deficiency anemia    "had to get an iron infusion"  . Migraine    "used to have them growing up" (03/25/2018)  . Presence of permanent cardiac pacemaker 03/25/2018    Past Surgical History:  Procedure Laterality Date  . APPENDECTOMY    . BIOPSY  05/19/2018   Procedure: BIOPSY;  Surgeon: Ladene Artist, MD;  Location: [facility] ENDOSCOPY;  Service: Endoscopy;;  . ESOPHAGOGASTRODUODENOSCOPY N/A 05/19/2018   Procedure: ESOPHAGOGASTRODUODENOSCOPY (EGD);  Surgeon: Ladene Artist, MD;  Location: [facility] ENDOSCOPY;  Service: Endoscopy;  Laterality: N/A;  . INSERT / REPLACE / REMOVE PACEMAKER  03/25/2018  . IR FLUORO GUIDE CV LINE RIGHT  05/18/2018  . IR LUMBAR DISC ASPIRATION W/IMG GUIDE  05/15/2018  . IR REMOVAL TUN CV CATH W/O FL  07/23/2018  . IR US GUIDE VASC ACCESS RIGHT  05/18/2018  . KNEE ARTHROSCOPY Bilateral   . PACEMAKER IMPLANT N/A 03/25/2018   Procedure: PACEMAKER IMPLANT;  Surgeon: Evans Lance, MD;  Location: [place] CV LAB;  Service: Cardiovascular;  Laterality:  N/A;  . PACEMAKER PLACEMENT Left 03/2018  . RIGHT HEART CATH N/A 03/20/2018   Procedure: RIGHT HEART CATH;  Surgeon: Nigel Mormon, MD;  Location: [place] CV LAB;  Service: Cardiovascular;  Laterality: N/A;  . TEE WITHOUT CARDIOVERSION N/A 03/20/2018   Procedure: TRANSESOPHAGEAL ECHOCARDIOGRAM (TEE);  Surgeon: Nigel Mormon, MD;  Location: [facility] ENDOSCOPY;  Service: Cardiovascular;  Laterality: N/A;  . TONSILLECTOMY      There were no vitals filed for this visit.  Subjective Assessment - 10/09/18 0937    Subjective  Patient reports some stiffness in her lower back.    Patient Stated Goals  get stronger, walk better    Currently in Pain?  Yes    Pain Score  4     Pain Location  Back    Pain Orientation  Lower    Pain Descriptors / Indicators  Aching   stiffness   Pain Type  Acute pain                       [facility] Adult PT Treatment/Exercise - 10/09/18 0001      Exercises   Exercises  Knee/Hip      Lumbar Exercises: Aerobic   Nustep  level 4 x 6 minutes      Knee/Hip Exercises: Standing   Hip  Flexion  Stengthening;Both;4 sets;5 sets    Hip Abduction  Stengthening;Both;4 sets;5 reps    Hip Extension  Stengthening;Both;4 sets;5 reps    Forward Step Up  Both;2 sets;10 reps;Hand Hold: 1;Step Height: 4"      Knee/Hip Exercises: Seated   Other Seated Knee/Hip Exercises  lumbar flex/ext on dyna disc x 10    Sit to Sand  10 reps      Knee/Hip Exercises: Supine   Straight Leg Raises  Strengthening;Both    Other Supine Knee/Hip Exercises  clams red band 2x10      Knee/Hip Exercises: Sidelying   Clams  with red band 1x10 ea (causes pain in upper post thigh bil)             PT Education - 10/09/18 1001    Education Details  HEP    Person(s) Educated  Patient    Methods  Explanation;Demonstration;Handout    Comprehension  Verbalized understanding;Returned demonstration       PT Short Term Goals - 10/07/18 0852      PT SHORT TERM GOAL #1    Title  give good HEP and have pateint do at home    Time  2    Period  Weeks    Status  New        PT Long Term Goals - 10/07/18 4098      PT LONG TERM GOAL #1   Title  reports able to go up and down stairs step over step    Time  8    Period  Weeks    Status  New      PT LONG TERM GOAL #2   Title  report pain decreased 25%    Time  8    Period  Weeks    Status  New      PT LONG TERM GOAL #3   Title  decrease TUG time to 13 seconds    Time  8    Period  Weeks    Status  New      PT LONG TERM GOAL #4   Title  increase lumbar ROM 25%    Time  8    Period  Weeks    Status  New            Plan - 10/09/18 1017    Clinical Impression Statement  Patient did very well with strenghtening toaday. She requires some cueing in standing TE to avoid trunk motion and to slow down.     PT Treatment/Interventions  ADLs/Self Care Home Management;Moist Heat;Therapeutic activities;Therapeutic exercise;Balance training;Functional mobility training;Gait training;Stair training;Patient/family education;Manual techniques    PT Next Visit Plan  slowly add exercises for strength and function       Patient will benefit from skilled therapeutic intervention in order to improve the following deficits and impairments:  Abnormal gait, Decreased range of motion, Difficulty walking, Increased muscle spasms, Cardiopulmonary status limiting activity, Decreased activity tolerance, Pain, Improper body mechanics, Impaired flexibility, Decreased balance, Decreased mobility, Decreased strength, Postural dysfunction  Visit Diagnosis: Muscle weakness (generalized)  Difficulty in walking, not elsewhere classified  Acute bilateral low back pain without sciatica     Problem List Patient Active Problem List   Diagnosis Date Noted  . Fluid retention   . Generalized edema   . Hypokalemia   . Acute right-sided low back pain with right-sided sciatica   . Acute blood loss anemia   . Anemia of  chronic disease   . Thrombocytopenia (  [place])   . Diabetes mellitus ([place])   . Morbid obesity ([place])   . Benign essential HTN   . Hypoalbuminemia due to protein-calorie malnutrition ([place])   . Occult blood in stools   . Gastric polyp   . Lumbar discitis 05/17/2018  . Blood loss anemia 05/12/2018  . Hyperlipidemia associated with type 2 diabetes mellitus ([place]) 05/10/2018  . Essential hypertension 04/29/2018  . CKD (chronic kidney disease), stage IV ([place])   . Diet-controlled diabetes mellitus ([place])   . GERD (gastroesophageal reflux disease)   . Gout   . High cholesterol   . Hypothyroidism   . Iron deficiency anemia   . AV block, Mobitz 2 03/25/2018  . Mobitz type 2 second degree AV block 03/20/2018  . Aortic stenosis 03/20/2018  . Exertional dyspnea 03/19/2018    Nana Vastine PT 10/09/2018, 10:20 AM  [place], [place], [address] Phone: [phone]   Fax:  [phone]  Name: Erika Fry MRN: [id number] Date of Birth: May 29, 1946

## 2018-10-13 ENCOUNTER — Ambulatory Visit: Payer: Medicare Other | Admitting: Physical Therapy

## 2018-10-13 DIAGNOSIS — R262 Difficulty in walking, not elsewhere classified: Secondary | ICD-10-CM

## 2018-10-13 DIAGNOSIS — R0602 Shortness of breath: Secondary | ICD-10-CM

## 2018-10-13 DIAGNOSIS — M6281 Muscle weakness (generalized): Secondary | ICD-10-CM

## 2018-10-13 DIAGNOSIS — M545 Low back pain, unspecified: Secondary | ICD-10-CM

## 2018-10-13 NOTE — Therapy (Signed)
[place], [place], [address] Phone: [phone]   Fax:  [phone]  Physical Therapy Treatment  Patient Details  Name: Erika Fry MRN: [id number] Date of Birth: 1946/07/22 Referring Provider (PT): Janus Molder, FNP   Encounter Date: 10/13/2018  PT End of Session - 10/13/18 1013    Visit Number  3    Date for PT Re-Evaluation  12/06/18    PT Start Time  0930    PT Stop Time  1093    PT Time Calculation (min)  44 min    Activity Tolerance  Patient tolerated treatment well    Behavior During Therapy  [facility] for tasks assessed/performed       Past Medical History:  Diagnosis Date  . Arthritis    "knees, thumbs" (03/25/2018)  . CKD (chronic kidney disease), stage IV ([place])   . Diet-controlled diabetes mellitus ([place])   . GERD (gastroesophageal reflux disease)   . Gout    "on daily RX" (03/25/2018)  . Heart murmur   . High cholesterol   . Hypertension   . Hypothyroidism   . Iron deficiency anemia    "had to get an iron infusion"  . Migraine    "used to have them growing up" (03/25/2018)  . Presence of permanent cardiac pacemaker 03/25/2018    Past Surgical History:  Procedure Laterality Date  . APPENDECTOMY    . BIOPSY  05/19/2018   Procedure: BIOPSY;  Surgeon: Ladene Artist, MD;  [facility] ENDOSCOPY;  Service: Endoscopy;;  . ESOPHAGOGASTRODUODENOSCOPY N/A 05/19/2018   Procedure: ESOPHAGOGASTRODUODENOSCOPY (EGD);  Surgeon: Ladene Artist, MD;  Location: [facility] ENDOSCOPY;  Service: Endoscopy;  Laterality: N/A;  . INSERT / REPLACE / REMOVE PACEMAKER  03/25/2018  . IR FLUORO GUIDE CV LINE RIGHT  05/18/2018  . IR LUMBAR DISC ASPIRATION W/IMG GUIDE  05/15/2018  . IR REMOVAL TUN CV CATH W/O FL  07/23/2018  . IR US GUIDE VASC ACCESS RIGHT  05/18/2018  . KNEE ARTHROSCOPY Bilateral   . PACEMAKER IMPLANT N/A 03/25/2018   Procedure: PACEMAKER IMPLANT;  Surgeon: Evans Lance, MD;  Location:  [place] CV LAB;  Service: Cardiovascular;  Laterality: N/A;  . PACEMAKER PLACEMENT Left 03/2018  . RIGHT HEART CATH N/A 03/20/2018   Procedure: RIGHT HEART CATH;  Surgeon: Nigel Mormon, MD;  Location: [place] CV LAB;  Service: Cardiovascular;  Laterality: N/A;  . TEE WITHOUT CARDIOVERSION N/A 03/20/2018   Procedure: TRANSESOPHAGEAL ECHOCARDIOGRAM (TEE);  Surgeon: Nigel Mormon, MD;  Location: [facility] ENDOSCOPY;  Service: Cardiovascular;  Laterality: N/A;  . TONSILLECTOMY      There were no vitals filed for this visit.  Subjective Assessment - 10/13/18 0932    Subjective  "I am doing pretty good, I slipped on some leaves and fell on my bottom this morning but I am ok"    Pain Score  4     Pain Location  Back    Pain Orientation  Lower                       [facility] Adult PT Treatment/Exercise - 10/13/18 0001      Exercises   Exercises  Knee/Hip      Lumbar Exercises: Aerobic   Recumbent Bike  6 min      Lumbar Exercises: Supine   Bridge  2 seconds;10 reps   2x10     Knee/Hip Exercises: Standing  Other Standing Knee Exercises  Hip 3 way, red Tband 2x10    Other Standing Knee Exercises  Bilateral Hip flexion red Tband around toes, 2x10      Knee/Hip Exercises: Seated   Marching  Strengthening;2 sets;10 reps   2.5 lb weights   Sit to Sand  10 reps   2x10 with holding red ball     Knee/Hip Exercises: Supine   Straight Leg Raises  Strengthening;Both      Knee/Hip Exercises: Sidelying   Clams  red Tband 2x15               PT Short Term Goals - 10/07/18 9622      PT SHORT TERM GOAL #1   Title  give good HEP and have pateint do at home    Time  2    Period  Weeks    Status  New        PT Long Term Goals - 10/07/18 2979      PT LONG TERM GOAL #1   Title  reports able to go up and down stairs step over step    Time  8    Period  Weeks    Status  New      PT LONG TERM GOAL #2   Title  report pain decreased 25%    Time  8     Period  Weeks    Status  New      PT LONG TERM GOAL #3   Title  decrease TUG time to 13 seconds    Time  8    Period  Weeks    Status  New      PT LONG TERM GOAL #4   Title  increase lumbar ROM 25%    Time  8    Period  Weeks    Status  New            Plan - 10/13/18 1014    Clinical Impression Statement  Pt tolerated progression of xercises well today. Pt required frequent rest breaks due to LE fatigue. Pt required mod VC to keep knee straight during hip 3 way exercise. Pt demonstrated decreased strength in L LE as compared to R LE during exercises today. Pt continues to slowly progress towards all goals at this time.     Rehab Potential  Good    PT Frequency  2x / week    PT Duration  8 weeks    PT Treatment/Interventions  ADLs/Self Care Home Management;Moist Heat;Therapeutic activities;Therapeutic exercise;Balance training;Functional mobility training;Gait training;Stair training;Patient/family education;Manual techniques    PT Next Visit Plan  Continue progressing strengthening exercises    Consulted and Agree with Plan of Care  Patient       Patient will benefit from skilled therapeutic intervention in order to improve the following deficits and impairments:  Abnormal gait, Decreased range of motion, Difficulty walking, Increased muscle spasms, Cardiopulmonary status limiting activity, Decreased activity tolerance, Pain, Improper body mechanics, Impaired flexibility, Decreased balance, Decreased mobility, Decreased strength, Postural dysfunction  Visit Diagnosis: Muscle weakness (generalized)  Difficulty in walking, not elsewhere classified  Acute bilateral low back pain without sciatica  SOB (shortness of breath)     Problem List Patient Active Problem List   Diagnosis Date Noted  . Fluid retention   . Generalized edema   . Hypokalemia   . Acute right-sided low back pain with right-sided sciatica   . Acute blood loss anemia   . Anemia of chronic  disease    . Thrombocytopenia ([place])   . Diabetes mellitus ([place])   . Morbid obesity ([place])   . Benign essential HTN   . Hypoalbuminemia due to protein-calorie malnutrition ([place])   . Occult blood in stools   . Gastric polyp   . Lumbar discitis 05/17/2018  . Blood loss anemia 05/12/2018  . Hyperlipidemia associated with type 2 diabetes mellitus ([place]) 05/10/2018  . Essential hypertension 04/29/2018  . CKD (chronic kidney disease), stage IV ([place])   . Diet-controlled diabetes mellitus ([place])   . GERD (gastroesophageal reflux disease)   . Gout   . High cholesterol   . Hypothyroidism   . Iron deficiency anemia   . AV block, Mobitz 2 03/25/2018  . Mobitz type 2 second degree AV block 03/20/2018  . Aortic stenosis 03/20/2018  . Exertional dyspnea 03/19/2018    Howell Rucks, SPTA 10/13/2018, 10:23 AM  [place] [address] [place], [place], [address] Phone: [phone]   Fax:  [phone]  Name: Erika Fry MRN: [id number] Date of Birth: September 05, 1946

## 2018-10-16 ENCOUNTER — Ambulatory Visit: Payer: Medicare Other | Admitting: Physical Therapy

## 2018-10-16 DIAGNOSIS — M6281 Muscle weakness (generalized): Secondary | ICD-10-CM | POA: Diagnosis not present

## 2018-10-16 DIAGNOSIS — M545 Low back pain, unspecified: Secondary | ICD-10-CM

## 2018-10-16 DIAGNOSIS — R262 Difficulty in walking, not elsewhere classified: Secondary | ICD-10-CM

## 2018-10-16 NOTE — Therapy (Signed)
[place], [place], [address] Phone: [phone]   Fax:  [phone]  Physical Therapy Treatment  Patient Details  Name: Erika Fry MRN: [id number] Date of Birth: 1946/06/19 Referring Provider (PT): Janus Molder, FNP   Encounter Date: 10/16/2018  PT End of Session - 10/16/18 0932    Visit Number  4    Date for PT Re-Evaluation  12/06/18    PT Start Time  0930    PT Stop Time  2778    PT Time Calculation (min)  45 min    Activity Tolerance  Patient limited by pain;Patient tolerated treatment well    Behavior During Therapy  [facility] for tasks assessed/performed       Past Medical History:  Diagnosis Date  . Arthritis    "knees, thumbs" (03/25/2018)  . CKD (chronic kidney disease), stage IV ([place])   . Diet-controlled diabetes mellitus ([place])   . GERD (gastroesophageal reflux disease)   . Gout    "on daily RX" (03/25/2018)  . Heart murmur   . High cholesterol   . Hypertension   . Hypothyroidism   . Iron deficiency anemia    "had to get an iron infusion"  . Migraine    "used to have them growing up" (03/25/2018)  . Presence of permanent cardiac pacemaker 03/25/2018    Past Surgical History:  Procedure Laterality Date  . APPENDECTOMY    . BIOPSY  05/19/2018   Procedure: BIOPSY;  Surgeon: Ladene Artist, MD;  Location: [facility] ENDOSCOPY;  Service: Endoscopy;;  . ESOPHAGOGASTRODUODENOSCOPY N/A 05/19/2018   Procedure: ESOPHAGOGASTRODUODENOSCOPY (EGD);  Surgeon: Ladene Artist, MD;  Location: [facility] ENDOSCOPY;  Service: Endoscopy;  Laterality: N/A;  . INSERT / REPLACE / REMOVE PACEMAKER  03/25/2018  . IR FLUORO GUIDE CV LINE RIGHT  05/18/2018  . IR LUMBAR DISC ASPIRATION W/IMG GUIDE  05/15/2018  . IR REMOVAL TUN CV CATH W/O FL  07/23/2018  . IR US GUIDE VASC ACCESS RIGHT  05/18/2018  . KNEE ARTHROSCOPY Bilateral   . PACEMAKER IMPLANT N/A 03/25/2018   Procedure: PACEMAKER IMPLANT;  Surgeon: Evans Lance, MD;  Location: [place] CV LAB;  Service: Cardiovascular;  Laterality: N/A;  . PACEMAKER PLACEMENT Left 03/2018  . RIGHT HEART CATH N/A 03/20/2018   Procedure: RIGHT HEART CATH;  Surgeon: Nigel Mormon, MD;  Location: [place] CV LAB;  Service: Cardiovascular;  Laterality: N/A;  . TEE WITHOUT CARDIOVERSION N/A 03/20/2018   Procedure: TRANSESOPHAGEAL ECHOCARDIOGRAM (TEE);  Surgeon: Nigel Mormon, MD;  Location: [facility] ENDOSCOPY;  Service: Cardiovascular;  Laterality: N/A;  . TONSILLECTOMY      There were no vitals filed for this visit.  Subjective Assessment - 10/16/18 0932    Subjective  "My left knee is hurting this morning. Might be the weather. No back pain today, but overdid it Wednesday and so that night was really hurting 10/10 and need Tylenol and heat".    Pertinent History  CKD, GERD, pacemaker    Limitations  Standing;Walking;House hold activities    Patient Stated Goals  get stronger, walk better    Currently in Pain?  No/denies                       [facility] Adult PT Treatment/Exercise - 10/16/18 0001      Exercises   Exercises  Knee/Hip      Lumbar Exercises: Aerobic   Nustep  level 5  x 6 min      Knee/Hip Exercises: Standing   Terminal Knee Extension  Strengthening;Left;2 sets;10 reps;Theraband    Theraband Level (Terminal Knee Extension)  Level 1 (Yellow)   against wall to stabilize hip   Forward Step Up  Both;2 sets;10 reps;Hand Hold: 1;Step Height: 4"   pain with eccentric > concentric   Other Standing Knee Exercises  RLE yellow hip ABD, ext bil, flex 2x10, Left side no resistance flex, ABD 2x10      Knee/Hip Exercises: Seated   Other Seated Knee/Hip Exercises  diagonal chops x 10 ea way    Sit to Sand  2 sets;5 reps;without UE support   lifting red ball [place]     Knee/Hip Exercises: Supine   Bridges  Strengthening;Both;2 sets;10 reps    Straight Leg Raises  Strengthening;Both 2 sets 10 reps              PT Short  Term Goals - 10/16/18 1005      PT SHORT TERM GOAL #1   Title  give good HEP and have pateint do at home    Baseline  not fully compliant yet    Time  2    Period  Weeks    Status  On-going        PT Long Term Goals - 10/16/18 1007      PT LONG TERM GOAL #1   Title  reports able to go up and down stairs step over step    Time  8    Period  Weeks    Status  On-going      PT LONG TERM GOAL #2   Title  report pain decreased 25%    Time  8    Period  Weeks    Status  On-going      PT LONG TERM GOAL #3   Title  decrease TUG time to 13 seconds    Baseline  12 sec     Time  8    Period  Weeks    Status  Achieved      PT LONG TERM GOAL #4   Title  increase lumbar ROM 25%    Period  Weeks    Status  On-going            Plan - 10/16/18 1016    Clinical Impression Statement  Patient reported increased pain in low back two days ago after "overdoing" it. She had no pain at start of treatment but did have pain with resisted hip flexion so we held those exercises. She also was compensating with her trunk during resisted hip exericses so resistance was lowered or eliminated. She met her TUG goal. Remaining goals are ongoing.    Rehab Potential  Good    PT Frequency  2x / week    PT Duration  8 weeks    PT Treatment/Interventions  ADLs/Self Care Home Management;Moist Heat;Therapeutic activities;Therapeutic exercise;Balance training;Functional mobility training;Gait training;Stair training;Patient/family education;Manual techniques    PT Next Visit Plan  Continue progressing strengthening exercises; careful with resisted hip flexion    Consulted and Agree with Plan of Care  Patient       Patient will benefit from skilled therapeutic intervention in order to improve the following deficits and impairments:  Abnormal gait, Decreased range of motion, Difficulty walking, Increased muscle spasms, Cardiopulmonary status limiting activity, Decreased activity tolerance, Pain, Improper  body mechanics, Impaired flexibility, Decreased balance, Decreased mobility, Decreased strength, Postural dysfunction  Visit Diagnosis:  Muscle weakness (generalized)  Difficulty in walking, not elsewhere classified  Acute bilateral low back pain without sciatica     Problem List Patient Active Problem List   Diagnosis Date Noted  . Fluid retention   . Generalized edema   . Hypokalemia   . Acute right-sided low back pain with right-sided sciatica   . Acute blood loss anemia   . Anemia of chronic disease   . Thrombocytopenia ([place])   . Diabetes mellitus ([place])   . Morbid obesity ([place])   . Benign essential HTN   . Hypoalbuminemia due to protein-calorie malnutrition ([place])   . Occult blood in stools   . Gastric polyp   . Lumbar discitis 05/17/2018  . Blood loss anemia 05/12/2018  . Hyperlipidemia associated with type 2 diabetes mellitus ([place]) 05/10/2018  . Essential hypertension 04/29/2018  . CKD (chronic kidney disease), stage IV ([place])   . Diet-controlled diabetes mellitus ([place])   . GERD (gastroesophageal reflux disease)   . Gout   . High cholesterol   . Hypothyroidism   . Iron deficiency anemia   . AV block, Mobitz 2 03/25/2018  . Mobitz type 2 second degree AV block 03/20/2018  . Aortic stenosis 03/20/2018  . Exertional dyspnea 03/19/2018    Hilario Robarts PT 10/16/2018, 10:20 AM  [place], [place], [address] Phone: [phone]   Fax:  [phone]  Name: JEIDI GILLES MRN: [id number] Date of Birth: 1946-10-30

## 2018-10-20 ENCOUNTER — Ambulatory Visit: Payer: Medicare Other | Admitting: Physical Therapy

## 2018-10-20 DIAGNOSIS — R262 Difficulty in walking, not elsewhere classified: Secondary | ICD-10-CM

## 2018-10-20 DIAGNOSIS — M4646 Discitis, unspecified, lumbar region: Secondary | ICD-10-CM

## 2018-10-20 DIAGNOSIS — M6281 Muscle weakness (generalized): Secondary | ICD-10-CM

## 2018-10-20 DIAGNOSIS — M545 Low back pain, unspecified: Secondary | ICD-10-CM

## 2018-10-20 DIAGNOSIS — R52 Pain, unspecified: Secondary | ICD-10-CM

## 2018-10-20 DIAGNOSIS — R609 Edema, unspecified: Secondary | ICD-10-CM

## 2018-10-20 DIAGNOSIS — R0602 Shortness of breath: Secondary | ICD-10-CM

## 2018-10-20 NOTE — Therapy (Signed)
Erika Fry, [place], [address] Phone: [phone]   Fax:  [phone]  Physical Therapy Treatment  Patient Details  Name: Erika Fry MRN: [id number] Date of Birth: 03-09-1946 Referring Provider (PT): Janus Molder, FNP   Encounter Date: 10/20/2018  PT End of Session - 10/20/18 1012    Visit Number  5    Date for PT Re-Evaluation  12/06/18    PT Start Time  0930    PT Stop Time  1013    PT Time Calculation (min)  43 min    Activity Tolerance  Patient limited by pain;Patient tolerated treatment well    Behavior During Therapy  [facility] for tasks assessed/performed       Past Medical History:  Diagnosis Date  . Arthritis    "knees, thumbs" (03/25/2018)  . CKD (chronic kidney disease), stage IV ([place])   . Diet-controlled diabetes mellitus ([place])   . GERD (gastroesophageal reflux disease)   . Gout    "on daily RX" (03/25/2018)  . Heart murmur   . High cholesterol   . Hypertension   . Hypothyroidism   . Iron deficiency anemia    "had to get an iron infusion"  . Migraine    "used to have them growing up" (03/25/2018)  . Presence of permanent cardiac pacemaker 03/25/2018    Past Surgical History:  Procedure Laterality Date  . APPENDECTOMY    . BIOPSY  05/19/2018   Procedure: BIOPSY;  Surgeon: Ladene Artist, MD;  Location: [facility] ENDOSCOPY;  Service: Endoscopy;;  . ESOPHAGOGASTRODUODENOSCOPY N/A 05/19/2018   Procedure: ESOPHAGOGASTRODUODENOSCOPY (EGD);  Surgeon: Ladene Artist, MD;  Location: [facility] ENDOSCOPY;  Service: Endoscopy;  Laterality: N/A;  . INSERT / REPLACE / REMOVE PACEMAKER  03/25/2018  . IR FLUORO GUIDE CV LINE RIGHT  05/18/2018  . IR LUMBAR DISC ASPIRATION W/IMG GUIDE  05/15/2018  . IR REMOVAL TUN CV CATH W/O FL  07/23/2018  . IR US GUIDE VASC ACCESS RIGHT  05/18/2018  . KNEE ARTHROSCOPY Bilateral   . PACEMAKER IMPLANT N/A 03/25/2018   Procedure: PACEMAKER IMPLANT;  Surgeon: Evans Lance, MD;  Location: [place] CV LAB;  Service: Cardiovascular;  Laterality: N/A;  . PACEMAKER PLACEMENT Left 03/2018  . RIGHT HEART CATH N/A 03/20/2018   Procedure: RIGHT HEART CATH;  Surgeon: Nigel Mormon, MD;  Location: [place] CV LAB;  Service: Cardiovascular;  Laterality: N/A;  . TEE WITHOUT CARDIOVERSION N/A 03/20/2018   Procedure: TRANSESOPHAGEAL ECHOCARDIOGRAM (TEE);  Surgeon: Nigel Mormon, MD;  Location: [facility] ENDOSCOPY;  Service: Cardiovascular;  Laterality: N/A;  . TONSILLECTOMY      There were no vitals filed for this visit.  Subjective Assessment - 10/20/18 0934    Subjective  "Iv been doing about the same, no changes"    Currently in Pain?  Yes    Pain Score  5     Pain Location  Back    Pain Orientation  Lower                       [facility] Adult PT Treatment/Exercise - 10/20/18 0001      Exercises   Exercises  Knee/Hip      Lumbar Exercises: Aerobic   Nustep  level 5 x 7 min      Knee/Hip Exercises: Machines for Strengthening   Cybex Knee Extension  2x10 5lbs    Cybex Knee Flexion  2x10 15lbs  Knee/Hip Exercises: Standing   Terminal Knee Extension  Strengthening;AROM;Left;2 sets;10 reps;Theraband    Theraband Level (Terminal Knee Extension)  Level 1 (Yellow)    Lateral Step Up  Both;2 sets;10 reps;Hand Hold: 1;Step Height: 4"    Forward Step Up  Both;2 sets;10 reps;Hand Hold: 1;Step Height: 4"    Other Standing Knee Exercises  hip 3 way 1.5 lbs      Knee/Hip Exercises: Seated   Sit to Sand  2 sets;10 reps;without UE support   holding red ball              PT Short Term Goals - 10/20/18 1014      PT SHORT TERM GOAL #1   Title  give good HEP and have pateint do at home    Time  2    Period  Weeks    Status  Achieved        PT Long Term Goals - 10/20/18 1016      PT LONG TERM GOAL #1   Title  reports able to go up and down stairs step over step    Time  8    Period  Weeks    Status  Partially Met             Plan - 10/20/18 1012    Clinical Impression Statement  Pt continues to have some pain in lower back and L knee while doing exercises, specifically hip 3 way and step ups. Pt required rest break x1 with STS due to LE fatigue. Pt also required rest break x2 with hip 3 way due to fatigue. Pt continues to slowly progress towards all goals at this time.     Rehab Potential  Good    PT Frequency  2x / week    PT Duration  8 weeks    PT Treatment/Interventions  ADLs/Self Care Home Management;Moist Heat;Therapeutic activities;Therapeutic exercise;Balance training;Functional mobility training;Gait training;Stair training;Patient/family education;Manual techniques    PT Next Visit Plan  Continue adding exercises, progress as pain allows    Consulted and Agree with Plan of Care  Patient       Patient will benefit from skilled therapeutic intervention in order to improve the following deficits and impairments:  Abnormal gait, Decreased range of motion, Difficulty walking, Increased muscle spasms, Cardiopulmonary status limiting activity, Decreased activity tolerance, Pain, Improper body mechanics, Impaired flexibility, Decreased balance, Decreased mobility, Decreased strength, Postural dysfunction  Visit Diagnosis: Muscle weakness (generalized)  Difficulty in walking, not elsewhere classified  Acute bilateral low back pain without sciatica  SOB (shortness of breath)  Pain  Shortness of breath  Fluid retention  Lumbar discitis     Problem List Patient Active Problem List   Diagnosis Date Noted  . Fluid retention   . Generalized edema   . Hypokalemia   . Acute right-sided low back pain with right-sided sciatica   . Acute blood loss anemia   . Anemia of chronic disease   . Thrombocytopenia ([place])   . Diabetes mellitus ([place])   . Morbid obesity ([place])   . Benign essential HTN   . Hypoalbuminemia due to protein-calorie malnutrition ([place])   . Occult blood in stools   .  Gastric polyp   . Lumbar discitis 05/17/2018  . Blood loss anemia 05/12/2018  . Hyperlipidemia associated with type 2 diabetes mellitus ([place]) 05/10/2018  . Essential hypertension 04/29/2018  . CKD (chronic kidney disease), stage IV ([place])   . Diet-controlled diabetes mellitus ([place])   . GERD (gastroesophageal  reflux disease)   . Gout   . High cholesterol   . Hypothyroidism   . Iron deficiency anemia   . AV block, Mobitz 2 03/25/2018  . Mobitz type 2 second degree AV block 03/20/2018  . Aortic stenosis 03/20/2018  . Exertional dyspnea 03/19/2018   Howell Rucks SPTA During this treatment session, the therapist was present, participating in and directing the treatment.  Madelyn Tlatelpa,ANGIE PTA 10/20/2018, 11:10 AM  [place], [place], [address] Phone: [phone]   Fax:  [phone]  Name: AARYANA BETKE MRN: [id number] Date of Birth: [birth year]

## 2018-10-20 NOTE — Therapy (Signed)
[name], [place], [address] Phone: [phone]   Fax:  [phone]  Physical Therapy Treatment  Patient Details  Name: Erika Fry MRN: [id number] Date of Birth: 03-09-1946 Referring Provider (PT): Janus Molder, FNP   Encounter Date: 10/20/2018  PT End of Session - 10/20/18 1012    Visit Number  5    Date for PT Re-Evaluation  12/06/18    PT Start Time  0930    PT Stop Time  1013    PT Time Calculation (min)  43 min    Activity Tolerance  Patient limited by pain;Patient tolerated treatment well    Behavior During Therapy  [facility] for tasks assessed/performed       Past Medical History:  Diagnosis Date  . Arthritis    "knees, thumbs" (03/25/2018)  . CKD (chronic kidney disease), stage IV ([place])   . Diet-controlled diabetes mellitus ([place])   . GERD (gastroesophageal reflux disease)   . Gout    "on daily RX" (03/25/2018)  . Heart murmur   . High cholesterol   . Hypertension   . Hypothyroidism   . Iron deficiency anemia    "had to get an iron infusion"  . Migraine    "used to have them growing up" (03/25/2018)  . Presence of permanent cardiac pacemaker 03/25/2018    Past Surgical History:  Procedure Laterality Date  . APPENDECTOMY    . BIOPSY  05/19/2018   Procedure: BIOPSY;  Surgeon: Ladene Artist, MD;  Location: [facility] ENDOSCOPY;  Service: Endoscopy;;  . ESOPHAGOGASTRODUODENOSCOPY N/A 05/19/2018   Procedure: ESOPHAGOGASTRODUODENOSCOPY (EGD);  Surgeon: Ladene Artist, MD;  Location: [facility] ENDOSCOPY;  Service: Endoscopy;  Laterality: N/A;  . INSERT / REPLACE / REMOVE PACEMAKER  03/25/2018  . IR FLUORO GUIDE CV LINE RIGHT  05/18/2018  . IR LUMBAR DISC ASPIRATION W/IMG GUIDE  05/15/2018  . IR REMOVAL TUN CV CATH W/O FL  07/23/2018  . IR US GUIDE VASC ACCESS RIGHT  05/18/2018  . KNEE ARTHROSCOPY Bilateral   . PACEMAKER IMPLANT N/A 03/25/2018   Procedure: PACEMAKER IMPLANT;  Surgeon: Evans Lance, MD;  Location: [place] CV LAB;  Service: Cardiovascular;  Laterality: N/A;  . PACEMAKER PLACEMENT Left 03/2018  . RIGHT HEART CATH N/A 03/20/2018   Procedure: RIGHT HEART CATH;  Surgeon: Nigel Mormon, MD;  Location: [place] CV LAB;  Service: Cardiovascular;  Laterality: N/A;  . TEE WITHOUT CARDIOVERSION N/A 03/20/2018   Procedure: TRANSESOPHAGEAL ECHOCARDIOGRAM (TEE);  Surgeon: Nigel Mormon, MD;  Location: [facility] ENDOSCOPY;  Service: Cardiovascular;  Laterality: N/A;  . TONSILLECTOMY      There were no vitals filed for this visit.  Subjective Assessment - 10/20/18 0934    Subjective  "Iv been doing about the same, no changes"    Currently in Pain?  Yes    Pain Score  5     Pain Location  Back    Pain Orientation  Lower                       [facility] Adult PT Treatment/Exercise - 10/20/18 0001      Exercises   Exercises  Knee/Hip      Lumbar Exercises: Aerobic   Nustep  level 5 x 7 min      Knee/Hip Exercises: Machines for Strengthening   Cybex Knee Extension  2x10 5lbs    Cybex Knee Flexion  2x10 15lbs  Knee/Hip Exercises: Standing   Terminal Knee Extension  Strengthening;AROM;Left;2 sets;10 reps;Theraband    Theraband Level (Terminal Knee Extension)  Level 1 (Yellow)    Lateral Step Up  Both;2 sets;10 reps;Hand Hold: 1;Step Height: 4"    Forward Step Up  Both;2 sets;10 reps;Hand Hold: 1;Step Height: 4"    Other Standing Knee Exercises  hip 3 way 1.5 lbs      Knee/Hip Exercises: Seated   Sit to Sand  2 sets;10 reps;without UE support   holding red ball              PT Short Term Goals - 10/20/18 1014      PT SHORT TERM GOAL #1   Title  give good HEP and have pateint do at home    Time  2    Period  Weeks    Status  Achieved        PT Long Term Goals - 10/20/18 1016      PT LONG TERM GOAL #1   Title  reports able to go up and down stairs step over step    Time  8    Period  Weeks    Status  Partially Met             Plan - 10/20/18 1012    Clinical Impression Statement  Pt continues to have some pain in lower back and L knee while doing exercises, specifically hip 3 way and step ups. Pt required rest break x1 with STS due to LE fatigue. Pt also required rest break x2 with hip 3 way due to fatigue. Pt continues to slowly progress towards all goals at this time.     Rehab Potential  Good    PT Frequency  2x / week    PT Duration  8 weeks    PT Treatment/Interventions  ADLs/Self Care Home Management;Moist Heat;Therapeutic activities;Therapeutic exercise;Balance training;Functional mobility training;Gait training;Stair training;Patient/family education;Manual techniques    PT Next Visit Plan  Continue adding exercises, progress as pain allows    Consulted and Agree with Plan of Care  Patient       Patient will benefit from skilled therapeutic intervention in order to improve the following deficits and impairments:  Abnormal gait, Decreased range of motion, Difficulty walking, Increased muscle spasms, Cardiopulmonary status limiting activity, Decreased activity tolerance, Pain, Improper body mechanics, Impaired flexibility, Decreased balance, Decreased mobility, Decreased strength, Postural dysfunction  Visit Diagnosis: Muscle weakness (generalized)  Difficulty in walking, not elsewhere classified  Acute bilateral low back pain without sciatica  SOB (shortness of breath)  Pain  Shortness of breath  Fluid retention  Lumbar discitis     Problem List Patient Active Problem List   Diagnosis Date Noted  . Fluid retention   . Generalized edema   . Hypokalemia   . Acute right-sided low back pain with right-sided sciatica   . Acute blood loss anemia   . Anemia of chronic disease   . Thrombocytopenia ([place])   . Diabetes mellitus ([place])   . Morbid obesity ([place])   . Benign essential HTN   . Hypoalbuminemia due to protein-calorie malnutrition ([place])   . Occult blood in stools   .  Gastric polyp   . Lumbar discitis 05/17/2018  . Blood loss anemia 05/12/2018  . Hyperlipidemia associated with type 2 diabetes mellitus ([place]) 05/10/2018  . Essential hypertension 04/29/2018  . CKD (chronic kidney disease), stage IV ([place])   . Diet-controlled diabetes mellitus ([place])   . GERD (gastroesophageal  reflux disease)   . Gout   . High cholesterol   . Hypothyroidism   . Iron deficiency anemia   . AV block, Mobitz 2 03/25/2018  . Mobitz type 2 second degree AV block 03/20/2018  . Aortic stenosis 03/20/2018  . Exertional dyspnea 03/19/2018    Howell Rucks, SPTA 10/20/2018, 10:17 AM  [place], [place], [address] Phone: [phone]   Fax:  [phone]  Name: Erika Fry MRN: [id number] Date of Birth: October 26, 1946

## 2018-10-23 ENCOUNTER — Ambulatory Visit: Payer: Medicare Other | Admitting: Physical Therapy

## 2018-10-23 DIAGNOSIS — M6281 Muscle weakness (generalized): Secondary | ICD-10-CM

## 2018-10-23 DIAGNOSIS — R52 Pain, unspecified: Secondary | ICD-10-CM

## 2018-10-23 DIAGNOSIS — M545 Low back pain, unspecified: Secondary | ICD-10-CM

## 2018-10-23 DIAGNOSIS — R0602 Shortness of breath: Secondary | ICD-10-CM

## 2018-10-23 DIAGNOSIS — R262 Difficulty in walking, not elsewhere classified: Secondary | ICD-10-CM

## 2018-10-23 DIAGNOSIS — R609 Edema, unspecified: Secondary | ICD-10-CM

## 2018-10-23 DIAGNOSIS — M4646 Discitis, unspecified, lumbar region: Secondary | ICD-10-CM

## 2018-10-23 NOTE — Therapy (Signed)
[place], [place], [address] Phone: [phone]   Fax:  [phone]  Physical Therapy Treatment  Patient Details  Name: Erika Fry MRN: [id number] Date of Birth: Nov 19, 1946 Referring Provider (PT): Janus Molder, FNP   Encounter Date: 10/23/2018  PT End of Session - 10/23/18 1012    Visit Number  6    Date for PT Re-Evaluation  12/06/18    PT Start Time  0930    PT Stop Time  2595    PT Time Calculation (min)  44 min    Activity Tolerance  Patient limited by pain;Patient tolerated treatment well    Behavior During Therapy  [facility] for tasks assessed/performed       Past Medical History:  Diagnosis Date  . Arthritis    "knees, thumbs" (03/25/2018)  . CKD (chronic kidney disease), stage IV ([place])   . Diet-controlled diabetes mellitus ([place])   . GERD (gastroesophageal reflux disease)   . Gout    "on daily RX" (03/25/2018)  . Heart murmur   . High cholesterol   . Hypertension   . Hypothyroidism   . Iron deficiency anemia    "had to get an iron infusion"  . Migraine    "used to have them growing up" (03/25/2018)  . Presence of permanent cardiac pacemaker 03/25/2018    Past Surgical History:  Procedure Laterality Date  . APPENDECTOMY    . BIOPSY  05/19/2018   Procedure: BIOPSY;  Surgeon: Ladene Artist, MD;  Location: [facility] ENDOSCOPY;  Service: Endoscopy;;  . ESOPHAGOGASTRODUODENOSCOPY N/A 05/19/2018   Procedure: ESOPHAGOGASTRODUODENOSCOPY (EGD);  Surgeon: Ladene Artist, MD;  Location: [facility] ENDOSCOPY;  Service: Endoscopy;  Laterality: N/A;  . INSERT / REPLACE / REMOVE PACEMAKER  03/25/2018  . IR FLUORO GUIDE CV LINE RIGHT  05/18/2018  . IR LUMBAR DISC ASPIRATION W/IMG GUIDE  05/15/2018  . IR REMOVAL TUN CV CATH W/O FL  07/23/2018  . IR US GUIDE VASC ACCESS RIGHT  05/18/2018  . KNEE ARTHROSCOPY Bilateral   . PACEMAKER IMPLANT N/A 03/25/2018   Procedure: PACEMAKER IMPLANT;  Surgeon: Evans Lance, MD;  Location: [place] CV LAB;  Service: Cardiovascular;  Laterality: N/A;  . PACEMAKER PLACEMENT Left 03/2018  . RIGHT HEART CATH N/A 03/20/2018   Procedure: RIGHT HEART CATH;  Surgeon: Nigel Mormon, MD;  Location: [place] CV LAB;  Service: Cardiovascular;  Laterality: N/A;  . TEE WITHOUT CARDIOVERSION N/A 03/20/2018   Procedure: TRANSESOPHAGEAL ECHOCARDIOGRAM (TEE);  Surgeon: Nigel Mormon, MD;  Location: [facility] ENDOSCOPY;  Service: Cardiovascular;  Laterality: N/A;  . TONSILLECTOMY      There were no vitals filed for this visit.  Subjective Assessment - 10/23/18 0935    Subjective  "Im doing good, nothing has really changed"    Currently in Pain?  Yes    Pain Score  2     Pain Location  Back    Pain Orientation  Lower                       [facility] Adult PT Treatment/Exercise - 10/23/18 0001      Exercises   Exercises  Lumbar      Lumbar Exercises: Aerobic   Nustep  level 5 x 7 min      Lumbar Exercises: Machines for Strengthening   Other Lumbar Machine Exercise  Rows 5# 2x10 & Lats 10# 2x10    Cybex  Knee Extension  2x10 10lbs    Cybex Knee Flexion  2x10 20lbs      Lumbar Exercises: Seated   Sit to Stand  20 reps   holding red ball     Knee/Hip Exercises: Standing   Other Standing Knee Exercises  hip 3 way 1.5 lbs               PT Short Term Goals - 10/20/18 1014      PT SHORT TERM GOAL #1   Title  give good HEP and have pateint do at home    Time  2    Period  Weeks    Status  Achieved        PT Long Term Goals - 10/23/18 1013      PT LONG TERM GOAL #1   Title  reports able to go up and down stairs step over step    Time  8    Period  Weeks    Status  Achieved            Plan - 10/23/18 1013    Clinical Impression Statement  Pt tolerated progression of weight and reps well in todays session. Tried leg press but pt stated that it hurt her back so exercise was stopped. Pt demonstrated increased LE  strength in todays session as compared to previous sessions. Pt required close supervision when transfering between machines to ensure safety.     Rehab Potential  Good    PT Frequency  2x / week    PT Duration  8 weeks    PT Treatment/Interventions  ADLs/Self Care Home Management;Moist Heat;Therapeutic activities;Therapeutic exercise;Balance training;Functional mobility training;Gait training;Stair training;Patient/family education;Manual techniques    PT Next Visit Plan  Continue adding exercises, progress as pain allows    Consulted and Agree with Plan of Care  Patient       Patient will benefit from skilled therapeutic intervention in order to improve the following deficits and impairments:  Abnormal gait, Decreased range of motion, Difficulty walking, Increased muscle spasms, Cardiopulmonary status limiting activity, Decreased activity tolerance, Pain, Improper body mechanics, Impaired flexibility, Decreased balance, Decreased mobility, Decreased strength, Postural dysfunction  Visit Diagnosis: Muscle weakness (generalized)  Difficulty in walking, not elsewhere classified  Acute bilateral low back pain without sciatica  SOB (shortness of breath)  Pain  Shortness of breath  Fluid retention  Lumbar discitis     Problem List Patient Active Problem List   Diagnosis Date Noted  . Fluid retention   . Generalized edema   . Hypokalemia   . Acute right-sided low back pain with right-sided sciatica   . Acute blood loss anemia   . Anemia of chronic disease   . Thrombocytopenia ([place])   . Diabetes mellitus ([place])   . Morbid obesity ([place])   . Benign essential HTN   . Hypoalbuminemia due to protein-calorie malnutrition ([place])   . Occult blood in stools   . Gastric polyp   . Lumbar discitis 05/17/2018  . Blood loss anemia 05/12/2018  . Hyperlipidemia associated with type 2 diabetes mellitus ([place]) 05/10/2018  . Essential hypertension 04/29/2018  . CKD (chronic kidney disease),  stage IV ([place])   . Diet-controlled diabetes mellitus ([place])   . GERD (gastroesophageal reflux disease)   . Gout   . High cholesterol   . Hypothyroidism   . Iron deficiency anemia   . AV block, Mobitz 2 03/25/2018  . Mobitz type 2 second degree AV block 03/20/2018  . Aortic  stenosis 03/20/2018  . Exertional dyspnea 03/19/2018    Howell Rucks, SPTA 10/23/2018, 10:19 AM  [place], [place], [address] Phone: [phone]   Fax:  [phone]  Name: GENETTA FIERO MRN: [id number] Date of Birth: 18-Jul-1946

## 2018-10-27 ENCOUNTER — Ambulatory Visit: Payer: Medicare Other | Admitting: Physical Therapy

## 2018-10-27 DIAGNOSIS — M6281 Muscle weakness (generalized): Secondary | ICD-10-CM | POA: Diagnosis not present

## 2018-10-27 DIAGNOSIS — R262 Difficulty in walking, not elsewhere classified: Secondary | ICD-10-CM

## 2018-10-27 NOTE — Patient Instructions (Signed)
Lower Trunk Rotation Stretch   Keeping back flat and feet together, rotate knees to left side. Hold __10__ seconds. Repeat __5__ times per set. Do ____ sets per session. Do _2_ sessions per day.  Erika Fry, PT 10/27/18 10:14 AM [place] [address] [place], [place], [address] Phone: [phone]   Fax:  [phone]

## 2018-10-27 NOTE — Therapy (Signed)
[place], [place], [address] Phone: [phone]   Fax:  [phone]  Physical Therapy Treatment  Patient Details  Name: Erika Fry MRN: [id number] Date of Birth: 1946/06/02 Referring Provider (PT): Janus Molder, FNP   Encounter Date: 10/27/2018  PT End of Session - 10/27/18 0933    Visit Number  7    Date for PT Re-Evaluation  12/06/18    PT Start Time  0933    PT Stop Time  9476    PT Time Calculation (min)  42 min    Activity Tolerance  Patient limited by pain;Patient tolerated treatment well    Behavior During Therapy  [facility] for tasks assessed/performed       Past Medical History:  Diagnosis Date  . Arthritis    "knees, thumbs" (03/25/2018)  . CKD (chronic kidney disease), stage IV ([employer])   . Diet-controlled diabetes mellitus ([place])   . GERD (gastroesophageal reflux disease)   . Gout    "on daily RX" (03/25/2018)  . Heart murmur   . High cholesterol   . Hypertension   . Hypothyroidism   . Iron deficiency anemia    "had to get an iron infusion"  . Migraine    "used to have them growing up" (03/25/2018)  . Presence of permanent cardiac pacemaker 03/25/2018    Past Surgical History:  Procedure Laterality Date  . APPENDECTOMY    . BIOPSY  05/19/2018   Procedure: BIOPSY;  Surgeon: Ladene Artist, MD;  Location: [facility] ENDOSCOPY;  Service: Endoscopy;;  . ESOPHAGOGASTRODUODENOSCOPY N/A 05/19/2018   Procedure: ESOPHAGOGASTRODUODENOSCOPY (EGD);  Surgeon: Ladene Artist, MD;  Location: [facility] ENDOSCOPY;  Service: Endoscopy;  Laterality: N/A;  . INSERT / REPLACE / REMOVE PACEMAKER  03/25/2018  . IR FLUORO GUIDE CV LINE RIGHT  05/18/2018  . IR LUMBAR DISC ASPIRATION W/IMG GUIDE  05/15/2018  . IR REMOVAL TUN CV CATH W/O FL  07/23/2018  . IR US GUIDE VASC ACCESS RIGHT  05/18/2018  . KNEE ARTHROSCOPY Bilateral   . PACEMAKER IMPLANT N/A 03/25/2018   Procedure: PACEMAKER IMPLANT;  Surgeon: Evans Lance, MD;  Location: [place] CV LAB;  Service: Cardiovascular;  Laterality: N/A;  . PACEMAKER PLACEMENT Left 03/2018  . RIGHT HEART CATH N/A 03/20/2018   Procedure: RIGHT HEART CATH;  Surgeon: Nigel Mormon, MD;  Location: [place] CV LAB;  Service: Cardiovascular;  Laterality: N/A;  . TEE WITHOUT CARDIOVERSION N/A 03/20/2018   Procedure: TRANSESOPHAGEAL ECHOCARDIOGRAM (TEE);  Surgeon: Nigel Mormon, MD;  Location: [facility] ENDOSCOPY;  Service: Cardiovascular;  Laterality: N/A;  . TONSILLECTOMY      There were no vitals filed for this visit.  Subjective Assessment - 10/27/18 0933    Subjective  I overdid it yesterday    Patient Stated Goals  get stronger, walk better    Pain Score  5     Pain Location  Back                       [facility] Adult PT Treatment/Exercise - 10/27/18 0001      Exercises   Exercises  Lumbar      Lumbar Exercises: Stretches   Lower Trunk Rotation  5 reps;10 seconds    Pelvic Tilt  10 reps      Lumbar Exercises: Aerobic   Nustep  level 5 x 5 min      Lumbar Exercises: Machines for Strengthening  Other Lumbar Machine Exercise  Rows 10# 2x10 & Lats 15# 2x10      Lumbar Exercises: Seated   Sit to Stand  20 reps   holding red ball   Other Seated Lumbar Exercises  side bends x 10 ea way     Other Seated Lumbar Exercises  chops x 10 bil       Knee/Hip Exercises: Standing   Other Standing Knee Exercises  hip 3 way 1.5 lbs 2X10               PT Short Term Goals - 10/20/18 1014      PT SHORT TERM GOAL #1   Title  give good HEP and have pateint do at home    Time  2    Period  Weeks    Status  Achieved        PT Long Term Goals - 10/27/18 0947      PT LONG TERM GOAL #1   Title  reports able to go up and down stairs step over step    Time  8    Period  Weeks    Status  Achieved      PT LONG TERM GOAL #2   Title  report pain decreased 25%    Time  8    Period  Weeks    Status  On-going      PT LONG  TERM GOAL #3   Title  decrease TUG time to 13 seconds    Time  8    Period  Weeks    Status  Achieved      PT LONG TERM GOAL #4   Title  increase lumbar ROM 25%    Time  8    Period  Weeks    Status  On-going            Plan - 10/27/18 1023    Clinical Impression Statement  Patient reports increased pain yesterday due to lifting 25# [place], vaccuming and doing a lot of other chores. She tolerated TE well without increased pain. We worked on mobility exercises in sitting and supine.    PT Treatment/Interventions  ADLs/Self Care Home Management;Moist Heat;Therapeutic activities;Therapeutic exercise;Balance training;Functional mobility training;Gait training;Stair training;Patient/family education;Manual techniques    PT Next Visit Plan  Continue adding exercises, progress as pain allows       Patient will benefit from skilled therapeutic intervention in order to improve the following deficits and impairments:  Abnormal gait, Decreased range of motion, Difficulty walking, Increased muscle spasms, Cardiopulmonary status limiting activity, Decreased activity tolerance, Pain, Improper body mechanics, Impaired flexibility, Decreased balance, Decreased mobility, Decreased strength, Postural dysfunction  Visit Diagnosis: Muscle weakness (generalized)  Difficulty in walking, not elsewhere classified     Problem List Patient Active Problem List   Diagnosis Date Noted  . Fluid retention   . Generalized edema   . Hypokalemia   . Acute right-sided low back pain with right-sided sciatica   . Acute blood loss anemia   . Anemia of chronic disease   . Thrombocytopenia ([place])   . Diabetes mellitus ([place])   . Morbid obesity ([place])   . Benign essential HTN   . Hypoalbuminemia due to protein-calorie malnutrition ([place])   . Occult blood in stools   . Gastric polyp   . Lumbar discitis 05/17/2018  . Blood loss anemia 05/12/2018  . Hyperlipidemia associated with type 2 diabetes mellitus ([place])  05/10/2018  . Essential hypertension 04/29/2018  . CKD (chronic  kidney disease), stage IV ([place])   . Diet-controlled diabetes mellitus ([place])   . GERD (gastroesophageal reflux disease)   . Gout   . High cholesterol   . Hypothyroidism   . Iron deficiency anemia   . AV block, Mobitz 2 03/25/2018  . Mobitz type 2 second degree AV block 03/20/2018  . Aortic stenosis 03/20/2018  . Exertional dyspnea 03/19/2018    Erika Fry PT 10/27/2018, 11:23 AM  [place] [address] [place], [place], [address] Phone: [phone]   Fax:  [phone]  Name: Erika Fry MRN: [id number] Date of Birth: 1946-03-11

## 2018-11-03 ENCOUNTER — Ambulatory Visit: Payer: Medicare Other | Attending: Family Medicine | Admitting: Physical Therapy

## 2018-11-03 DIAGNOSIS — R52 Pain, unspecified: Secondary | ICD-10-CM

## 2018-11-03 DIAGNOSIS — M545 Low back pain, unspecified: Secondary | ICD-10-CM

## 2018-11-03 DIAGNOSIS — M6281 Muscle weakness (generalized): Secondary | ICD-10-CM | POA: Diagnosis not present

## 2018-11-03 DIAGNOSIS — R0602 Shortness of breath: Secondary | ICD-10-CM | POA: Diagnosis present

## 2018-11-03 DIAGNOSIS — R262 Difficulty in walking, not elsewhere classified: Secondary | ICD-10-CM | POA: Diagnosis present

## 2018-11-03 DIAGNOSIS — M4646 Discitis, unspecified, lumbar region: Secondary | ICD-10-CM

## 2018-11-03 DIAGNOSIS — R609 Edema, unspecified: Secondary | ICD-10-CM | POA: Diagnosis present

## 2018-11-03 NOTE — Therapy (Signed)
[place], [place], [address] Phone: [phone]   Fax:  [phone]  Physical Therapy Treatment  Patient Details  Name: Erika Fry MRN: [id number] Date of Birth: [birth year] Referring Provider (PT): Janus Molder, FNP   Encounter Date: 11/03/2018  PT End of Session - 11/03/18 1013    Visit Number  8    Date for PT Re-Evaluation  12/06/18    PT Start Time  0930    PT Stop Time  6144    PT Time Calculation (min)  44 min    Activity Tolerance  Patient limited by pain;Patient tolerated treatment well    Behavior During Therapy  [facility] for tasks assessed/performed       Past Medical History:  Diagnosis Date  . Arthritis    "knees, thumbs" (03/25/2018)  . CKD (chronic kidney disease), stage IV ([place])   . Diet-controlled diabetes mellitus ([place])   . GERD (gastroesophageal reflux disease)   . Gout    "on daily RX" (03/25/2018)  . Heart murmur   . High cholesterol   . Hypertension   . Hypothyroidism   . Iron deficiency anemia    "had to get an iron infusion"  . Migraine    "used to have them growing up" (03/25/2018)  . Presence of permanent cardiac pacemaker 03/25/2018    Past Surgical History:  Procedure Laterality Date  . APPENDECTOMY    . BIOPSY  05/19/2018   Procedure: BIOPSY;  Surgeon: Ladene Artist, MD;  Location: [facility] ENDOSCOPY;  Service: Endoscopy;;  . ESOPHAGOGASTRODUODENOSCOPY N/A 05/19/2018   Procedure: ESOPHAGOGASTRODUODENOSCOPY (EGD);  Surgeon: Ladene Artist, MD;  Location: [facility] ENDOSCOPY;  Service: Endoscopy;  Laterality: N/A;  . INSERT / REPLACE / REMOVE PACEMAKER  03/25/2018  . IR FLUORO GUIDE CV LINE RIGHT  05/18/2018  . IR LUMBAR DISC ASPIRATION W/IMG GUIDE  05/15/2018  . IR REMOVAL TUN CV CATH W/O FL  07/23/2018  . IR US GUIDE VASC ACCESS RIGHT  05/18/2018  . KNEE ARTHROSCOPY Bilateral   . PACEMAKER IMPLANT N/A 03/25/2018   Procedure: PACEMAKER IMPLANT;  Surgeon: Evans Lance, MD;  Location: [place] CV LAB;  Service: Cardiovascular;  Laterality: N/A;  . PACEMAKER PLACEMENT Left 03/2018  . RIGHT HEART CATH N/A 03/20/2018   Procedure: RIGHT HEART CATH;  Surgeon: Nigel Mormon, MD;  Location: [place] CV LAB;  Service: Cardiovascular;  Laterality: N/A;  . TEE WITHOUT CARDIOVERSION N/A 03/20/2018   Procedure: TRANSESOPHAGEAL ECHOCARDIOGRAM (TEE);  Surgeon: Nigel Mormon, MD;  Location: [facility] ENDOSCOPY;  Service: Cardiovascular;  Laterality: N/A;  . TONSILLECTOMY      There were no vitals filed for this visit.  Subjective Assessment - 11/03/18 0937    Subjective  "Im doing better today than last time"    Currently in Pain?  Yes    Pain Score  3     Pain Location  Back    Pain Orientation  Lower         [facility] PT Assessment - 11/03/18 0001      ROM / Strength   AROM / PROM / Strength  AROM      AROM   Overall AROM Comments  Lumbar Spine WNL                   [facility] Adult PT Treatment/Exercise - 11/03/18 0001      Exercises   Exercises  Lumbar  Lumbar Exercises: Stretches   Lower Trunk Rotation  5 reps;10 seconds      Lumbar Exercises: Aerobic   Nustep  level 5 x 7 min      Lumbar Exercises: Machines for Strengthening   Other Lumbar Machine Exercise  Rows 15# 2x10 & Lats 20# 2x10    Cybex Knee Extension  2x10 15lbs    Cybex Knee Flexion  3x10 25 lbs      Lumbar Exercises: Seated   Sit to Stand  20 reps   yellow ball     Lumbar Exercises: Supine   Bridge  2 seconds;10 reps    Bridge with [employer]  10 reps;Compliant;3 seconds               PT Short Term Goals - 10/20/18 1014      PT SHORT TERM GOAL #1   Title  give good HEP and have pateint do at home    Time  2    Period  Weeks    Status  Achieved        PT Long Term Goals - 11/03/18 1018      PT LONG TERM GOAL #2   Title  report pain decreased 25%    Time  8    Period  Weeks    Status  On-going      PT LONG TERM GOAL #4    Title  increase lumbar ROM 25%    Time  8    Period  Weeks    Status  Achieved            Plan - 11/03/18 1014    Clinical Impression Statement  Pt demonstrated improved UE & LE strength as seen by increased weight and reps this session as compared to previous sessions. Pt demonstrated good body machanics with all exercises. Pt demonstrated lumbar ROM WFL and has met that goal. Pt required rest break x1 with STS due to LE fatigue. Pt reported some pain in L knee while doing knee extensions which she reported was due to arthritis.    Rehab Potential  Good    PT Frequency  2x / week    PT Duration  8 weeks    PT Treatment/Interventions  ADLs/Self Care Home Management;Moist Heat;Therapeutic activities;Therapeutic exercise;Balance training;Functional mobility training;Gait training;Stair training;Patient/family education;Manual techniques    PT Next Visit Plan  Continue adding exercises, progress as pain allows    Consulted and Agree with Plan of Care  Patient       Patient will benefit from skilled therapeutic intervention in order to improve the following deficits and impairments:  Abnormal gait, Decreased range of motion, Difficulty walking, Increased muscle spasms, Cardiopulmonary status limiting activity, Decreased activity tolerance, Pain, Improper body mechanics, Impaired flexibility, Decreased balance, Decreased mobility, Decreased strength, Postural dysfunction  Visit Diagnosis: Muscle weakness (generalized)  Difficulty in walking, not elsewhere classified  Acute bilateral low back pain without sciatica  SOB (shortness of breath)  Pain  Shortness of breath  Fluid retention  Lumbar discitis     Problem List Patient Active Problem List   Diagnosis Date Noted  . Fluid retention   . Generalized edema   . Hypokalemia   . Acute right-sided low back pain with right-sided sciatica   . Acute blood loss anemia   . Anemia of chronic disease   . Thrombocytopenia  ([place])   . Diabetes mellitus ([place])   . Morbid obesity ([place])   . Benign essential HTN   .  Hypoalbuminemia due to protein-calorie malnutrition ([place])   . Occult blood in stools   . Gastric polyp   . Lumbar discitis 05/17/2018  . Blood loss anemia 05/12/2018  . Hyperlipidemia associated with type 2 diabetes mellitus ([place]) 05/10/2018  . Essential hypertension 04/29/2018  . CKD (chronic kidney disease), stage IV ([place])   . Diet-controlled diabetes mellitus ([place])   . GERD (gastroesophageal reflux disease)   . Gout   . High cholesterol   . Hypothyroidism   . Iron deficiency anemia   . AV block, Mobitz 2 03/25/2018  . Mobitz type 2 second degree AV block 03/20/2018  . Aortic stenosis 03/20/2018  . Exertional dyspnea 03/19/2018    Howell Rucks, SPTA 11/03/2018, 10:19 AM  [place] [address] [place], [place], [address] Phone: [phone]   Fax:  [phone]  Name: Erika Fry MRN: [id number] Date of Birth: 1946/02/12

## 2018-11-06 ENCOUNTER — Ambulatory Visit: Payer: Medicare Other | Admitting: Physical Therapy

## 2018-11-06 DIAGNOSIS — M6281 Muscle weakness (generalized): Secondary | ICD-10-CM

## 2018-11-06 DIAGNOSIS — R262 Difficulty in walking, not elsewhere classified: Secondary | ICD-10-CM

## 2018-11-06 DIAGNOSIS — M545 Low back pain, unspecified: Secondary | ICD-10-CM

## 2018-11-06 DIAGNOSIS — M4646 Discitis, unspecified, lumbar region: Secondary | ICD-10-CM

## 2018-11-06 DIAGNOSIS — R52 Pain, unspecified: Secondary | ICD-10-CM

## 2018-11-06 DIAGNOSIS — R0602 Shortness of breath: Secondary | ICD-10-CM

## 2018-11-06 DIAGNOSIS — R609 Edema, unspecified: Secondary | ICD-10-CM

## 2018-11-06 NOTE — Therapy (Signed)
[place], [place], [address] Phone: [phone]   Fax:  [phone]  Physical Therapy Treatment  Patient Details  Name: Erika Fry MRN: [id number] Date of Birth: 1946/11/06 Referring Provider (PT): Janus Molder, FNP   Encounter Date: 11/06/2018  PT End of Session - 11/06/18 1013    Visit Number  9    Date for PT Re-Evaluation  12/06/18    PT Start Time  0930    PT Stop Time  1013    PT Time Calculation (min)  43 min    Activity Tolerance  Patient limited by pain;Patient tolerated treatment well    Behavior During Therapy  [facility] for tasks assessed/performed       Past Medical History:  Diagnosis Date  . Arthritis    "knees, thumbs" (03/25/2018)  . CKD (chronic kidney disease), stage IV ([place])   . Diet-controlled diabetes mellitus ([place])   . GERD (gastroesophageal reflux disease)   . Gout    "on daily RX" (03/25/2018)  . Heart murmur   . High cholesterol   . Hypertension   . Hypothyroidism   . Iron deficiency anemia    "had to get an iron infusion"  . Migraine    "used to have them growing up" (03/25/2018)  . Presence of permanent cardiac pacemaker 03/25/2018    Past Surgical History:  Procedure Laterality Date  . APPENDECTOMY    . BIOPSY  05/19/2018   Procedure: BIOPSY;  Surgeon: Ladene Artist, MD;  Location: [facility] ENDOSCOPY;  Service: Endoscopy;;  . ESOPHAGOGASTRODUODENOSCOPY N/A 05/19/2018   Procedure: ESOPHAGOGASTRODUODENOSCOPY (EGD);  Surgeon: Ladene Artist, MD;  Location: [facility] ENDOSCOPY;  Service: Endoscopy;  Laterality: N/A;  . INSERT / REPLACE / REMOVE PACEMAKER  03/25/2018  . IR FLUORO GUIDE CV LINE RIGHT  05/18/2018  . IR LUMBAR DISC ASPIRATION W/IMG GUIDE  05/15/2018  . IR REMOVAL TUN CV CATH W/O FL  07/23/2018  . IR US GUIDE VASC ACCESS RIGHT  05/18/2018  . KNEE ARTHROSCOPY Bilateral   . PACEMAKER IMPLANT N/A 03/25/2018   Procedure: PACEMAKER IMPLANT;  Surgeon: Evans Lance, MD;  Location: [facility] CV LAB;  Service: Cardiovascular;  Laterality: N/A;  . PACEMAKER PLACEMENT Left 03/2018  . RIGHT HEART CATH N/A 03/20/2018   Procedure: RIGHT HEART CATH;  Surgeon: Nigel Mormon, MD;  Location: [employer] CV LAB;  Service: Cardiovascular;  Laterality: N/A;  . TEE WITHOUT CARDIOVERSION N/A 03/20/2018   Procedure: TRANSESOPHAGEAL ECHOCARDIOGRAM (TEE);  Surgeon: Nigel Mormon, MD;  Location: [facility] ENDOSCOPY;  Service: Cardiovascular;  Laterality: N/A;  . TONSILLECTOMY      There were no vitals filed for this visit.  Subjective Assessment - 11/06/18 0936    Subjective  "Im just tired been running around"    Currently in Pain?  Yes    Pain Score  2     Pain Location  Back    Pain Orientation  Lower                       [facility] Adult PT Treatment/Exercise - 11/06/18 0001      Exercises   Exercises  Lumbar      Lumbar Exercises: Aerobic   Recumbent Bike  7 min      Lumbar Exercises: Machines for Strengthening   Other Lumbar Machine Exercise  Rows 20# 2x10 & Lats 20# 3x10    Cybex Knee Extension  3x10 15lbs    Cybex Knee Flexion  3x10 25 lbs      Lumbar Exercises: Supine   Bridge with Ball Squeeze  Compliant;3 seconds;20 reps    Bridge with clamshell  Compliant;20 reps;2 seconds   yellow Tband              PT Short Term Goals - 10/20/18 1014      PT SHORT TERM GOAL #1   Title  give good HEP and have pateint do at home    Time  2    Period  Weeks    Status  Achieved        PT Long Term Goals - 11/06/18 0940      PT LONG TERM GOAL #2   Title  report pain decreased 25%    Time  8    Period  Weeks    Status  Achieved            Plan - 11/06/18 1013    Clinical Impression Statement  Pt tolerated progression of reps and weight well without increased pain. Pt required min VC for sequencing with Bridging with clams. Pt required rest break x 1 with bridges due to core Fatigue. Pt continues to  progress towards all therapy goals appropriatly at this time.     Rehab Potential  Good    PT Frequency  2x / week    PT Duration  8 weeks    PT Treatment/Interventions  ADLs/Self Care Home Management;Moist Heat;Therapeutic activities;Therapeutic exercise;Balance training;Functional mobility training;Gait training;Stair training;Patient/family education;Manual techniques    PT Next Visit Plan  Continue adding exercises, progress as pain allows    Consulted and Agree with Plan of Care  Patient       Patient will benefit from skilled therapeutic intervention in order to improve the following deficits and impairments:  Abnormal gait, Decreased range of motion, Difficulty walking, Increased muscle spasms, Cardiopulmonary status limiting activity, Decreased activity tolerance, Pain, Improper body mechanics, Impaired flexibility, Decreased balance, Decreased mobility, Decreased strength, Postural dysfunction  Visit Diagnosis: Muscle weakness (generalized)  Difficulty in walking, not elsewhere classified  Acute bilateral low back pain without sciatica  SOB (shortness of breath)  Pain  Shortness of breath  Fluid retention  Lumbar discitis     Problem List Patient Active Problem List   Diagnosis Date Noted  . Fluid retention   . Generalized edema   . Hypokalemia   . Acute right-sided low back pain with right-sided sciatica   . Acute blood loss anemia   . Anemia of chronic disease   . Thrombocytopenia ([place])   . Diabetes mellitus ([place])   . Morbid obesity ([place])   . Benign essential HTN   . Hypoalbuminemia due to protein-calorie malnutrition ([place])   . Occult blood in stools   . Gastric polyp   . Lumbar discitis 05/17/2018  . Blood loss anemia 05/12/2018  . Hyperlipidemia associated with type 2 diabetes mellitus ([place]) 05/10/2018  . Essential hypertension 04/29/2018  . CKD (chronic kidney disease), stage IV ([place])   . Diet-controlled diabetes mellitus ([place])   . GERD  (gastroesophageal reflux disease)   . Gout   . High cholesterol   . Hypothyroidism   . Iron deficiency anemia   . AV block, Mobitz 2 03/25/2018  . Mobitz type 2 second degree AV block 03/20/2018  . Aortic stenosis 03/20/2018  . Exertional dyspnea 03/19/2018    Howell Rucks, SPTA 11/06/2018, 10:16 AM  [place], [place], [address] Phone: [phone]   Fax:  [phone]  Name: Erika Fry MRN: [id number] Date of Birth: 1946/05/11

## 2018-11-10 ENCOUNTER — Ambulatory Visit: Payer: Medicare Other | Admitting: Physical Therapy

## 2018-11-12 ENCOUNTER — Encounter: Payer: Self-pay | Admitting: Physical Therapy

## 2018-11-12 ENCOUNTER — Ambulatory Visit: Payer: Medicare Other | Admitting: Physical Therapy

## 2018-11-12 DIAGNOSIS — M545 Low back pain, unspecified: Secondary | ICD-10-CM

## 2018-11-12 DIAGNOSIS — R262 Difficulty in walking, not elsewhere classified: Secondary | ICD-10-CM

## 2018-11-12 DIAGNOSIS — M6281 Muscle weakness (generalized): Secondary | ICD-10-CM

## 2018-11-12 NOTE — Therapy (Addendum)
[place] [address] [place], [place], [address] Phone: [phone]   Fax:  [phone] Progress Note Reporting Period 10/06/2018 to 11/12/2018 for the first 10 visits  See note below for Objective Data and Assessment of Progress/Goals.      Physical Therapy Treatment  Patient Details  Name: SADHANA FRATER MRN: [id number] Date of Birth: 08/31/1946 Referring Provider (PT): Janus Molder, FNP   Encounter Date: 11/12/2018  PT End of Session - 11/12/18 1223    Visit Number  10    Date for PT Re-Evaluation  12/06/18    PT Start Time  2951    PT Stop Time  1230    PT Time Calculation (min)  45 min    Activity Tolerance  Patient tolerated treatment well    Behavior During Therapy  [facility] for tasks assessed/performed       Past Medical History:  Diagnosis Date  . Arthritis    "knees, thumbs" (03/25/2018)  . CKD (chronic kidney disease), stage IV ([place])   . Diet-controlled diabetes mellitus ([place])   . GERD (gastroesophageal reflux disease)   . Gout    "on daily RX" (03/25/2018)  . Heart murmur   . High cholesterol   . Hypertension   . Hypothyroidism   . Iron deficiency anemia    "had to get an iron infusion"  . Migraine    "used to have them growing up" (03/25/2018)  . Presence of permanent cardiac pacemaker 03/25/2018    Past Surgical History:  Procedure Laterality Date  . APPENDECTOMY    . BIOPSY  05/19/2018   Procedure: BIOPSY;  Surgeon: Ladene Artist, MD;  Location: [facility] ENDOSCOPY;  Service: Endoscopy;;  . ESOPHAGOGASTRODUODENOSCOPY N/A 05/19/2018   Procedure: ESOPHAGOGASTRODUODENOSCOPY (EGD);  Surgeon: Ladene Artist, MD;  Location: [facility] ENDOSCOPY;  Service: Endoscopy;  Laterality: N/A;  . INSERT / REPLACE / REMOVE PACEMAKER  03/25/2018  . IR FLUORO GUIDE CV LINE RIGHT  05/18/2018  . IR LUMBAR DISC ASPIRATION W/IMG GUIDE  05/15/2018  . IR REMOVAL TUN CV CATH W/O FL  07/23/2018  . IR US GUIDE VASC  ACCESS RIGHT  05/18/2018  . KNEE ARTHROSCOPY Bilateral   . PACEMAKER IMPLANT N/A 03/25/2018   Procedure: PACEMAKER IMPLANT;  Surgeon: Evans Lance, MD;  Location: [place] CV LAB;  Service: Cardiovascular;  Laterality: N/A;  . PACEMAKER PLACEMENT Left 03/2018  . RIGHT HEART CATH N/A 03/20/2018   Procedure: RIGHT HEART CATH;  Surgeon: Nigel Mormon, MD;  Location: [place] CV LAB;  Service: Cardiovascular;  Laterality: N/A;  . TEE WITHOUT CARDIOVERSION N/A 03/20/2018   Procedure: TRANSESOPHAGEAL ECHOCARDIOGRAM (TEE);  Surgeon: Nigel Mormon, MD;  Location: [facility] ENDOSCOPY;  Service: Cardiovascular;  Laterality: N/A;  . TONSILLECTOMY      There were no vitals filed for this visit.  Subjective Assessment - 11/12/18 1148    Subjective  "All things considered I feel great" Pt reports that she has some LBP, but she just found out she has a UTI    Currently in Pain?  Yes    Pain Score  7     Pain Location  Back    Pain Orientation  Lower                       [facility] Adult PT Treatment/Exercise - 11/12/18 0001      Lumbar Exercises: Aerobic   Nustep  level 5 x 6 min  Lumbar Exercises: Machines for Strengthening   Other Lumbar Machine Exercise  Rows 20# 2x10 & Lats 20# 3x10      Lumbar Exercises: Standing   Other Standing Lumbar Exercises  [place] ext 5lb 2x10       Lumbar Exercises: Supine   Bridge  2 seconds;10 reps    Other Supine Lumbar Exercises  LE on pball small bridges, K2C, oblq      Knee/Hip Exercises: Machines for Strengthening   Cybex Knee Extension  2x10 10lbs    Cybex Knee Flexion  3x10 25 lbs    Cybex Leg Press  20lb 2x10                PT Short Term Goals - 10/20/18 1014      PT SHORT TERM GOAL #1   Title  give good HEP and have pateint do at home    Time  2    Period  Weeks    Status  Achieved        PT Long Term Goals - 11/12/18 1231      PT LONG TERM GOAL #1   Title  reports able to go up and down stairs step  over step    Status  Achieved      PT LONG TERM GOAL #2   Title  report pain decreased 25%    Status  Achieved      PT LONG TERM GOAL #3   Title  decrease TUG time to 13 seconds    Status  Achieved      PT LONG TERM GOAL #4   Title  increase lumbar ROM 25%    Status  Achieved            Plan - 11/12/18 1227    Clinical Impression Statement  Pt ambulated in clinic without SPC. Pt reports that's her LBP is related to ger recent UTI diagnoses. No issues with today's exercises. She did have some core instability with the supine exercises while feet on Pball. Postural cues needed with standing shoulder extensions.    Rehab Potential  Good    PT Frequency  2x / week    PT Duration  8 weeks    PT Treatment/Interventions  Joint Manipulations    PT Next Visit Plan  Continue adding exercises, progress as pain allows, Talk about D/C       Patient will benefit from skilled therapeutic intervention in order to improve the following deficits and impairments:  Abnormal gait, Decreased range of motion, Difficulty walking, Increased muscle spasms, Cardiopulmonary status limiting activity, Decreased activity tolerance, Pain, Improper body mechanics, Impaired flexibility, Decreased balance, Decreased mobility, Decreased strength, Postural dysfunction  Visit Diagnosis: Muscle weakness (generalized)  Difficulty in walking, not elsewhere classified  Acute bilateral low back pain without sciatica     Problem List Patient Active Problem List   Diagnosis Date Noted  . Fluid retention   . Generalized edema   . Hypokalemia   . Acute right-sided low back pain with right-sided sciatica   . Acute blood loss anemia   . Anemia of chronic disease   . Thrombocytopenia ([place])   . Diabetes mellitus ([place])   . Morbid obesity ([place])   . Benign essential HTN   . Hypoalbuminemia due to protein-calorie malnutrition ([place])   . Occult blood in stools   . Gastric polyp   . Lumbar discitis 05/17/2018  .  Blood loss anemia 05/12/2018  . Hyperlipidemia associated with type 2 diabetes mellitus (  [place]) 05/10/2018  . Essential hypertension 04/29/2018  . CKD (chronic kidney disease), stage IV ([place])   . Diet-controlled diabetes mellitus ([place])   . GERD (gastroesophageal reflux disease)   . Gout   . High cholesterol   . Hypothyroidism   . Iron deficiency anemia   . AV block, Mobitz 2 03/25/2018  . Mobitz type 2 second degree AV block 03/20/2018  . Aortic stenosis 03/20/2018  . Exertional dyspnea 03/19/2018    Scot Jun, PTA 11/12/2018, 12:32 PM  [place], [place], [address] Phone: [phone]   Fax:  [phone]  Name: ANISTYN GRADDY MRN: [id number] Date of Birth: 07-Dec-1945

## 2018-11-13 ENCOUNTER — Ambulatory Visit: Payer: Medicare Other | Admitting: Physical Therapy

## 2018-11-13 DIAGNOSIS — M545 Low back pain, unspecified: Secondary | ICD-10-CM

## 2018-11-13 DIAGNOSIS — M6281 Muscle weakness (generalized): Secondary | ICD-10-CM | POA: Diagnosis not present

## 2018-11-13 DIAGNOSIS — R262 Difficulty in walking, not elsewhere classified: Secondary | ICD-10-CM

## 2018-11-13 NOTE — Therapy (Signed)
[place], [place], [address] Phone: [phone]   Fax:  [phone]  Physical Therapy Treatment  Patient Details  Name: Erika Fry MRN: [id number] Date of Birth: 06-Oct-1946 Referring Provider (PT): Janus Molder, FNP   Encounter Date: 11/13/2018  PT End of Session - 11/13/18 0953    Visit Number  11    Date for PT Re-Evaluation  12/06/18    PT Start Time  0928    PT Stop Time  1010    PT Time Calculation (min)  42 min       Past Medical History:  Diagnosis Date  . Arthritis    "knees, thumbs" (03/25/2018)  . CKD (chronic kidney disease), stage IV ([place])   . Diet-controlled diabetes mellitus ([place])   . GERD (gastroesophageal reflux disease)   . Gout    "on daily RX" (03/25/2018)  . Heart murmur   . High cholesterol   . Hypertension   . Hypothyroidism   . Iron deficiency anemia    "had to get an iron infusion"  . Migraine    "used to have them growing up" (03/25/2018)  . Presence of permanent cardiac pacemaker 03/25/2018    Past Surgical History:  Procedure Laterality Date  . APPENDECTOMY    . BIOPSY  05/19/2018   Procedure: BIOPSY;  Surgeon: Ladene Artist, MD;  Location: [facility] ENDOSCOPY;  Service: Endoscopy;;  . ESOPHAGOGASTRODUODENOSCOPY N/A 05/19/2018   Procedure: ESOPHAGOGASTRODUODENOSCOPY (EGD);  Surgeon: Ladene Artist, MD;  Location: [facility] ENDOSCOPY;  Service: Endoscopy;  Laterality: N/A;  . INSERT / REPLACE / REMOVE PACEMAKER  03/25/2018  . IR FLUORO GUIDE CV LINE RIGHT  05/18/2018  . IR LUMBAR DISC ASPIRATION W/IMG GUIDE  05/15/2018  . IR REMOVAL TUN CV CATH W/O FL  07/23/2018  . IR US GUIDE VASC ACCESS RIGHT  05/18/2018  . KNEE ARTHROSCOPY Bilateral   . PACEMAKER IMPLANT N/A 03/25/2018   Procedure: PACEMAKER IMPLANT;  Surgeon: Evans Lance, MD;  Location: [place] CV LAB;  Service: Cardiovascular;  Laterality: N/A;  . PACEMAKER PLACEMENT Left 03/2018  . RIGHT HEART  CATH N/A 03/20/2018   Procedure: RIGHT HEART CATH;  Surgeon: Nigel Mormon, MD;  Location: [place] CV LAB;  Service: Cardiovascular;  Laterality: N/A;  . TEE WITHOUT CARDIOVERSION N/A 03/20/2018   Procedure: TRANSESOPHAGEAL ECHOCARDIOGRAM (TEE);  Surgeon: Nigel Mormon, MD;  Location: [facility] ENDOSCOPY;  Service: Cardiovascular;  Laterality: N/A;  . TONSILLECTOMY      There were no vitals filed for this visit.  Subjective Assessment - 11/13/18 0933    Subjective  doing great except back pain from UTI . " need to leave alittle early"    Currently in Pain?  Yes    Pain Location  Back    Pain Orientation  Lower         [facility] PT Assessment - 11/13/18 0001      Strength   Overall Strength Comments  4/5    Strength Assessment Site  --   LE with easy fatigue                  [facility] Adult PT Treatment/Exercise - 11/13/18 0001      Exercises   Exercises  Lumbar      Lumbar Exercises: Aerobic   Nustep  level 5 x 6 min      Lumbar Exercises: Machines for Strengthening   Cybex Lumbar Extension  black  band 2 set s10   black band trunk flexion 2 sets 10   Other Lumbar Machine Exercise  Rows 20# 2x10 & Lats 20# 3x10    Cybex Knee Extension  2x10 10lbs    Cybex Knee Flexion  2x10 25 lbs      Lumbar Exercises: Standing   Other Standing Lumbar Exercises  [place] ext and row 5lb 2x10     Other Standing Lumbar Exercises  hip pulley 5# 10 resp 4 way BIL               PT Short Term Goals - 10/20/18 1014      PT SHORT TERM GOAL #1   Title  give good HEP and have pateint do at home    Time  2    Period  Weeks    Status  Achieved        PT Long Term Goals - 11/13/18 1016      PT LONG TERM GOAL #5   Title  LE strength to WNL's    Time  6    Period  Weeks    Status  New      Additional Long Term Goals   Additional Long Term Goals  Yes      PT LONG TERM GOAL #6   Title  ambulate community distances without device    Time  6    Period  Weeks     Status  New            Plan - 11/13/18 8676    Clinical Impression Statement  added standing pulleys for scap/core stab and hips which pt tolerated well but fatigued easily esp in hips. all goals met    PT Next Visit Plan  will talk to PT about adding MMT and gait goal       Patient will benefit from skilled therapeutic intervention in order to improve the following deficits and impairments:     Visit Diagnosis: Muscle weakness (generalized)  Difficulty in walking, not elsewhere classified  Acute bilateral low back pain without sciatica     Problem List Patient Active Problem List   Diagnosis Date Noted  . Fluid retention   . Generalized edema   . Hypokalemia   . Acute right-sided low back pain with right-sided sciatica   . Acute blood loss anemia   . Anemia of chronic disease   . Thrombocytopenia ([place])   . Diabetes mellitus ([place])   . Morbid obesity ([place])   . Benign essential HTN   . Hypoalbuminemia due to protein-calorie malnutrition ([place])   . Occult blood in stools   . Gastric polyp   . Lumbar discitis 05/17/2018  . Blood loss anemia 05/12/2018  . Hyperlipidemia associated with type 2 diabetes mellitus ([place]) 05/10/2018  . Essential hypertension 04/29/2018  . CKD (chronic kidney disease), stage IV ([place])   . Diet-controlled diabetes mellitus ([place])   . GERD (gastroesophageal reflux disease)   . Gout   . High cholesterol   . Hypothyroidism   . Iron deficiency anemia   . AV block, Mobitz 2 03/25/2018  . Mobitz type 2 second degree AV block 03/20/2018  . Aortic stenosis 03/20/2018  . Exertional dyspnea 03/19/2018    Sumner Boast., PT 11/13/2018, 10:17 AM  [place] [address] [place], [place], [address] Phone: [phone]   Fax:  [phone]  Name: Erika Fry MRN: [id number] Date of Birth: 1946/06/21

## 2018-11-16 ENCOUNTER — Encounter: Payer: Self-pay | Admitting: Internal Medicine

## 2018-11-16 ENCOUNTER — Ambulatory Visit: Payer: Medicare Other | Admitting: Internal Medicine

## 2018-11-16 VITALS — BP 159/85 | HR 58 | Temp 98.0°F | Ht 67.0 in | Wt 173.0 lb

## 2018-11-16 DIAGNOSIS — M4646 Discitis, unspecified, lumbar region: Secondary | ICD-10-CM

## 2018-11-16 DIAGNOSIS — N184 Chronic kidney disease, stage 4 (severe): Secondary | ICD-10-CM

## 2018-11-16 NOTE — Progress Notes (Signed)
RFV:   Patient ID: Erika Fry, female   DOB: 21-Dec-1945, 72 y.o.   MRN: [id number]  HPI Erika Fry is a15yo F with ckd 4, HTN, hx of lumbar osteo, finished IV abtx course now on doxycycline. Continues to increase mobility with Working with pt. In terms of low back pain, she isTaking tylenol for back pain once a day. She recently had a few days of urinary urgency and dysuria for which she is Getting treated with bactrim for uti by pcp.  Outpatient Encounter Medications as of 11/16/2018  Medication Sig  . acetaminophen (TYLENOL) 325 MG tablet Take 2 tablets (650 mg total) by mouth 4 (four) times daily -  before meals and at bedtime.  [name] Kitchen allopurinol (ZYLOPRIM) 100 MG tablet Take 2 tablets (200 mg total) by mouth daily.  [name] Kitchen amLODipine (NORVASC) 10 MG tablet Take 1 tablet (10 mg total) by mouth daily.  . cetirizine (ZYRTEC) 10 MG tablet Take 10 mg by mouth at bedtime.   [name] Kitchen doxycycline (VIBRA-TABS) 100 MG tablet Take 1 tablet (100 mg total) by mouth 2 (two) times daily.  . ferrous sulfate 325 (65 FE) MG tablet Take 1 tablet (325 mg total) by mouth 2 (two) times daily with a meal.  . fluticasone (FLONASE) 50 MCG/ACT nasal spray Place 2 sprays into both nostrils daily as needed for allergies or rhinitis.  . folic acid (FOLVITE) 1 MG tablet Take 1 tablet (1 mg total) by mouth daily.  . furosemide (LASIX) 80 MG tablet Take 1 tablet (80 mg total) by mouth 2 (two) times daily. (Patient taking differently: Take 80 mg by mouth daily. )  . gabapentin (NEURONTIN) 400 MG capsule Take 1 capsule (400 mg total) by mouth at bedtime.  . hydrALAZINE (APRESOLINE) 50 MG tablet Take 1.5 tablets (75 mg total) by mouth 3 (three) times daily.  . Insulin Glargine (LANTUS) 100 UNIT/ML Solostar Pen Inject 20 Units into the skin daily.  . Insulin Pen Needle 31G X 6 MM MISC 1 application by Does not apply route daily.  [name] Kitchen levothyroxine (SYNTHROID, LEVOTHROID) 75 MCG tablet Take 75 mcg by mouth daily before breakfast.  .  lidocaine (LIDODERM) 5 % Place 1 patch onto the skin daily. Available over the counter  . Melatonin 3 MG TABS Take 0.5 tablets (1.5 mg total) by mouth at bedtime.  . Multiple Vitamin (MULTI-VITAMIN PO) Take 1 tablet by mouth daily.  [name] Kitchen oxyCODONE (OXY IR/ROXICODONE) 5 MG immediate release tablet Take 1 tablet (5 mg total) by mouth every 6 (six) hours as needed for severe pain.  . pantoprazole (PROTONIX) 40 MG tablet Take 1 tablet (40 mg total) by mouth daily at 6 (six) AM.  . polyethylene glycol (MIRALAX / GLYCOLAX) packet Take 17 g by mouth daily as needed.  . potassium chloride (K-DUR) 10 MEQ tablet Take 1 tablet (10 mEq total) by mouth 3 (three) times daily.  [name] Kitchen senna (SENOKOT) 8.6 MG TABS tablet Take 1 tablet (8.6 mg total) by mouth daily.  . simvastatin (ZOCOR) 40 MG tablet Take 0.5 tablets (20 mg total) by mouth at bedtime.  [name] Kitchen tiZANidine (ZANAFLEX) 2 MG tablet Take 1 tablet (2 mg total) by mouth daily.  . vitamin B-12 (CYANOCOBALAMIN) 1000 MCG tablet Take 1 tablet (1,000 mcg total) by mouth daily.   No facility-administered encounter medications on file as of 11/16/2018.      Patient Active Problem List   Diagnosis Date Noted  . Fluid retention   . Generalized edema   . Hypokalemia   .  Acute right-sided low back pain with right-sided sciatica   . Acute blood loss anemia   . Anemia of chronic disease   . Thrombocytopenia ([place])   . Diabetes mellitus ([place])   . Morbid obesity ([place])   . Benign essential HTN   . Hypoalbuminemia due to protein-calorie malnutrition ([place])   . Occult blood in stools   . Gastric polyp   . Lumbar discitis 05/17/2018  . Blood loss anemia 05/12/2018  . Hyperlipidemia associated with type 2 diabetes mellitus ([place]) 05/10/2018  . Essential hypertension 04/29/2018  . CKD (chronic kidney disease), stage IV ([place])   . Diet-controlled diabetes mellitus ([place])   . GERD (gastroesophageal reflux disease)   . Gout   . High cholesterol   . Hypothyroidism   . Iron  deficiency anemia   . AV block, Mobitz 2 03/25/2018  . Mobitz type 2 second degree AV block 03/20/2018  . Aortic stenosis 03/20/2018  . Exertional dyspnea 03/19/2018     Health Maintenance Due  Topic Date Due  . Hepatitis C Screening  07/11/44  . FOOT EXAM  04/16/1954  . OPHTHALMOLOGY EXAM  04/16/1954  . TETANUS/TDAP  04/17/1963  . MAMMOGRAM  04/16/1994  . COLONOSCOPY  04/16/1994  . DEXA SCAN  04/16/2009  . HEMOGLOBIN A1C  10/31/2018    Social History   Tobacco Use  . Smoking status: Never Smoker  . Smokeless tobacco: Never Used  Substance Use Topics  . Alcohol use: Not Currently  . Drug use: Never   Review of Systems 12 point ros is negative except what is mentioned in hpi Physical Exam   BP (!) 159/85   Pulse (!) 58   Temp 98 F (36.7 C)   Ht 5\' 7"  (1.702 m)   Wt 173 lb (78.5 kg)   BMI 27.10 kg/m    Physical Exam  Constitutional:  oriented to person, place, and time. appears well-developed and well-nourished. No distress.  HENT: [place]/AT, PERRLA, no scleral icterus Mouth/Throat: Oropharynx is clear and moist. No oropharyngeal exudate.  Cardiovascular: Normal rate, regular rhythm and normal heart sounds. Exam reveals no gallop and no friction rub.  No murmur heard.  Pulmonary/Chest: Effort normal and breath sounds normal. No respiratory distress.  has no wheezes.  Neck = supple, no nuchal rigidity Abdominal: Soft. Bowel sounds are normal.  exhibits no distension. There is no tenderness.  Lymphadenopathy: no cervical adenopathy. No axillary adenopathy Neurological: alert and oriented to person, place, and time.  Skin: Skin is warm and dry. No rash noted. No erythema.  Psychiatric: a normal mood and affect.  behavior is normal.   CBC Lab Results  Component Value Date   WBC 10.7 09/14/2018   RBC 3.59 (L) 09/14/2018   HGB 10.9 (L) 09/14/2018   HCT 32.3 (L) 09/14/2018   PLT 193 09/14/2018   MCV 90.0 09/14/2018   MCH 30.4 09/14/2018   MCHC 33.7 09/14/2018    RDW 14.1 09/14/2018   LYMPHSABS 2,151 09/14/2018   MONOABS 0.5 06/18/2018   EOSABS 556 (H) 09/14/2018    BMET Lab Results  Component Value Date   NA 138 09/14/2018   K 3.8 09/14/2018   CL 98 09/14/2018   CO2 26 09/14/2018   GLUCOSE 143 (H) 09/14/2018   BUN 68 (H) 09/14/2018   CREATININE 2.81 (H) 09/14/2018   CALCIUM 10.9 (H) 09/14/2018   GFRNONAA 17 (L) 07/02/2018   GFRAA 20 (L) 07/02/2018    Lab Results  Component Value Date   ESRSEDRATE >130 (H)  09/14/2018   Lab Results  Component Value Date   CRP 7.0 09/14/2018     Assessment and Plan  Lumbar discitis = Continue on doxy for addn 1-2 month then consider to observe off of tx. Will check sed rate and crp at next visit  rtc in 4 -5 wk

## 2018-11-17 ENCOUNTER — Ambulatory Visit: Payer: Medicare Other | Admitting: Physical Therapy

## 2018-11-17 DIAGNOSIS — M6281 Muscle weakness (generalized): Secondary | ICD-10-CM | POA: Diagnosis not present

## 2018-11-17 DIAGNOSIS — R262 Difficulty in walking, not elsewhere classified: Secondary | ICD-10-CM

## 2018-11-17 DIAGNOSIS — M545 Low back pain, unspecified: Secondary | ICD-10-CM

## 2018-11-17 NOTE — Therapy (Signed)
[place], [place], [address] Phone: [phone]   Fax:  [phone]  Physical Therapy Treatment  Patient Details  Name: Erika Fry MRN: [id number] Date of Birth: 1946-09-20 Referring Provider (PT): Janus Molder, FNP   Encounter Date: 11/17/2018  PT End of Session - 11/17/18 1000    Visit Number  12    Date for PT Re-Evaluation  12/06/18    PT Start Time  0930    PT Stop Time  1015    PT Time Calculation (min)  45 min       Past Medical History:  Diagnosis Date  . Arthritis    "knees, thumbs" (03/25/2018)  . CKD (chronic kidney disease), stage IV ([place])   . Diet-controlled diabetes mellitus ([place])   . GERD (gastroesophageal reflux disease)   . Gout    "on daily RX" (03/25/2018)  . Heart murmur   . High cholesterol   . Hypertension   . Hypothyroidism   . Iron deficiency anemia    "had to get an iron infusion"  . Migraine    "used to have them growing up" (03/25/2018)  . Presence of permanent cardiac pacemaker 03/25/2018    Past Surgical History:  Procedure Laterality Date  . APPENDECTOMY    . BIOPSY  05/19/2018   Procedure: BIOPSY;  Surgeon: Ladene Artist, MD;  Location: [facility] ENDOSCOPY;  Service: Endoscopy;;  . ESOPHAGOGASTRODUODENOSCOPY N/A 05/19/2018   Procedure: ESOPHAGOGASTRODUODENOSCOPY (EGD);  Surgeon: Ladene Artist, MD;  Location: [facility] ENDOSCOPY;  Service: Endoscopy;  Laterality: N/A;  . INSERT / REPLACE / REMOVE PACEMAKER  03/25/2018  . IR FLUORO GUIDE CV LINE RIGHT  05/18/2018  . IR LUMBAR DISC ASPIRATION W/IMG GUIDE  05/15/2018  . IR REMOVAL TUN CV CATH W/O FL  07/23/2018  . IR US GUIDE VASC ACCESS RIGHT  05/18/2018  . KNEE ARTHROSCOPY Bilateral   . PACEMAKER IMPLANT N/A 03/25/2018   Procedure: PACEMAKER IMPLANT;  Surgeon: Evans Lance, MD;  Location: [facility] CV LAB;  Service: Cardiovascular;  Laterality: N/A;  . PACEMAKER PLACEMENT Left 03/2018  . RIGHT HEART  CATH N/A 03/20/2018   Procedure: RIGHT HEART CATH;  Surgeon: Nigel Mormon, MD;  Location: [place] CV LAB;  Service: Cardiovascular;  Laterality: N/A;  . TEE WITHOUT CARDIOVERSION N/A 03/20/2018   Procedure: TRANSESOPHAGEAL ECHOCARDIOGRAM (TEE);  Surgeon: Nigel Mormon, MD;  Location: [facility] ENDOSCOPY;  Service: Cardiovascular;  Laterality: N/A;  . TONSILLECTOMY      There were no vitals filed for this visit.  Subjective Assessment - 11/17/18 0934    Subjective  saw MD and "thinning" so staying on antibiotic fo ranother month    Currently in Pain?  Yes    Pain Score  3     Pain Location  Back                       [facility] Adult PT Treatment/Exercise - 11/17/18 0001      Lumbar Exercises: Aerobic   Nustep  level 5 x 7 min      Lumbar Exercises: Machines for Strengthening   Cybex Lumbar Extension  black band 2 sets 10 flexion and ext    Other Lumbar Machine Exercise  Rows 20# 2 x 15 & Lats 20# 2 x 15      Lumbar Exercises: Seated   Other Seated Lumbar Exercises  side bends x 10 ea way - wt  ball   STS with wt ball 2 sets 10   Other Seated Lumbar Exercises  chops x 10 bil -wt ball      Knee/Hip Exercises: Machines for Strengthening   Cybex Knee Extension  2x10 10lbs    Cybex Knee Flexion  2x10 25 lbs      Knee/Hip Exercises: Standing   Other Standing Knee Exercises  hip 3 way on airex red tband 15 times each               PT Short Term Goals - 10/20/18 1014      PT SHORT TERM GOAL #1   Title  give good HEP and have pateint do at home    Time  2    Period  Weeks    Status  Achieved        PT Long Term Goals - 11/13/18 1016      PT LONG TERM GOAL #5   Title  LE strength to WNL's    Time  6    Period  Weeks    Status  New      Additional Long Term Goals   Additional Long Term Goals  Yes      PT LONG TERM GOAL #6   Title  ambulate community distances without device    Time  6    Period  Weeks    Status  New             Plan - 11/17/18 1000    Clinical Impression Statement  pt tolerating increased ther ex as noted by added ex, wts and reps/minutes. some c/o RT side back pain but mostly c/o pain. rest needed with ther ex    PT Next Visit Plan  progress LE an dcore strength       Patient will benefit from skilled therapeutic intervention in order to improve the following deficits and impairments:  Abnormal gait, Decreased range of motion, Difficulty walking, Increased muscle spasms, Cardiopulmonary status limiting activity, Decreased activity tolerance, Pain, Improper body mechanics, Impaired flexibility, Decreased balance, Decreased mobility, Decreased strength, Postural dysfunction  Visit Diagnosis: Muscle weakness (generalized)  Difficulty in walking, not elsewhere classified  Acute bilateral low back pain without sciatica     Problem List Patient Active Problem List   Diagnosis Date Noted  . Fluid retention   . Generalized edema   . Hypokalemia   . Acute right-sided low back pain with right-sided sciatica   . Acute blood loss anemia   . Anemia of chronic disease   . Thrombocytopenia ([place])   . Diabetes mellitus ([place])   . Morbid obesity ([place])   . Benign essential HTN   . Hypoalbuminemia due to protein-calorie malnutrition ([place])   . Occult blood in stools   . Gastric polyp   . Lumbar discitis 05/17/2018  . Blood loss anemia 05/12/2018  . Hyperlipidemia associated with type 2 diabetes mellitus ([place]) 05/10/2018  . Essential hypertension 04/29/2018  . CKD (chronic kidney disease), stage IV ([place])   . Diet-controlled diabetes mellitus ([place])   . GERD (gastroesophageal reflux disease)   . Gout   . High cholesterol   . Hypothyroidism   . Iron deficiency anemia   . AV block, Mobitz 2 03/25/2018  . Mobitz type 2 second degree AV block 03/20/2018  . Aortic stenosis 03/20/2018  . Exertional dyspnea 03/19/2018    Latorsha Curling,ANGIE PTA 11/17/2018, 10:02 AM  [place]  [place], [place], [address] Phone: [phone]   Fax:  [phone]  Name: Erika Fry MRN: [id number] Date of Birth: 09-04-1946

## 2018-11-20 ENCOUNTER — Ambulatory Visit: Payer: Medicare Other | Admitting: Physical Therapy

## 2018-11-20 DIAGNOSIS — M6281 Muscle weakness (generalized): Secondary | ICD-10-CM

## 2018-11-20 DIAGNOSIS — R262 Difficulty in walking, not elsewhere classified: Secondary | ICD-10-CM

## 2018-11-20 NOTE — Therapy (Signed)
[facility] [place], [place], [address] Phone: [phone]   Fax:  [phone]  Physical Therapy Treatment  Patient Details  Name: Erika Fry MRN: [id number] Date of Birth: Sep 26, 1946 Referring Provider (PT): Janus Molder, FNP   Encounter Date: 11/20/2018  PT End of Session - 11/20/18 1003    Visit Number  13    Date for PT Re-Evaluation  12/06/18    PT Start Time  0929    PT Stop Time  1008    PT Time Calculation (min)  39 min       Past Medical History:  Diagnosis Date  . Arthritis    "knees, thumbs" (03/25/2018)  . CKD (chronic kidney disease), stage IV ([place])   . Diet-controlled diabetes mellitus ([place])   . GERD (gastroesophageal reflux disease)   . Gout    "on daily RX" (03/25/2018)  . Heart murmur   . High cholesterol   . Hypertension   . Hypothyroidism   . Iron deficiency anemia    "had to get an iron infusion"  . Migraine    "used to have them growing up" (03/25/2018)  . Presence of permanent cardiac pacemaker 03/25/2018    Past Surgical History:  Procedure Laterality Date  . APPENDECTOMY    . BIOPSY  05/19/2018   Procedure: BIOPSY;  Surgeon: Ladene Artist, MD;  Location: [facility] ENDOSCOPY;  Service: Endoscopy;;  . ESOPHAGOGASTRODUODENOSCOPY N/A 05/19/2018   Procedure: ESOPHAGOGASTRODUODENOSCOPY (EGD);  Surgeon: Ladene Artist, MD;  Location: [facility] ENDOSCOPY;  Service: Endoscopy;  Laterality: N/A;  . INSERT / REPLACE / REMOVE PACEMAKER  03/25/2018  . IR FLUORO GUIDE CV LINE RIGHT  05/18/2018  . IR LUMBAR DISC ASPIRATION W/IMG GUIDE  05/15/2018  . IR REMOVAL TUN CV CATH W/O FL  07/23/2018  . IR US GUIDE VASC ACCESS RIGHT  05/18/2018  . KNEE ARTHROSCOPY Bilateral   . PACEMAKER IMPLANT N/A 03/25/2018   Procedure: PACEMAKER IMPLANT;  Surgeon: Evans Lance, MD;  Location: [place] CV LAB;  Service: Cardiovascular;  Laterality: N/A;  . PACEMAKER PLACEMENT Left 03/2018  . RIGHT HEART  CATH N/A 03/20/2018   Procedure: RIGHT HEART CATH;  Surgeon: Nigel Mormon, MD;  Location: [place] CV LAB;  Service: Cardiovascular;  Laterality: N/A;  . TEE WITHOUT CARDIOVERSION N/A 03/20/2018   Procedure: TRANSESOPHAGEAL ECHOCARDIOGRAM (TEE);  Surgeon: Nigel Mormon, MD;  Location: [facility] ENDOSCOPY;  Service: Cardiovascular;  Laterality: N/A;  . TONSILLECTOMY      There were no vitals filed for this visit.  Subjective Assessment - 11/20/18 0933    Subjective  seeing MD later this morning still think I have UTI    Currently in Pain?  Yes    Pain Score  4     Pain Location  Back                       [facility] Adult PT Treatment/Exercise - 11/20/18 0001      Lumbar Exercises: Aerobic   Nustep  level 5 x 7 min      Lumbar Exercises: Machines for Strengthening   Cybex Lumbar Extension  black band 2 sets 10 flexion and ext    Other Lumbar Machine Exercise  scap stab pulleys 15 times 3 way      Lumbar Exercises: Seated   Sit to Stand  20 reps   on airex with wt ball     Knee/Hip  Exercises: Machines for Strengthening   Cybex Knee Extension  2x12 10lbs    Cybex Knee Flexion  2x12 25 lbs      Knee/Hip Exercises: Standing   Walking with Sports Cord  4 way 5 times each      Knee/Hip Exercises: Seated   Clamshell with TheraBand  Blue    Marching  Strengthening;Both;15 reps   blue tband              PT Short Term Goals - 10/20/18 1014      PT SHORT TERM GOAL #1   Title  give good HEP and have pateint do at home    Time  2    Period  Weeks    Status  Achieved        PT Long Term Goals - 11/13/18 1016      PT LONG TERM GOAL #5   Title  LE strength to WNL's    Time  6    Period  Weeks    Status  New      Additional Long Term Goals   Additional Long Term Goals  Yes      PT LONG TERM GOAL #6   Title  ambulate community distances without device    Time  6    Period  Weeks    Status  New            Plan - 11/20/18 1003     Clinical Impression Statement  pt fatigued with LE strengthening added reps and advanced ther ex. progressing towards strength and community gait goal    PT Next Visit Plan  walk outside       Patient will benefit from skilled therapeutic intervention in order to improve the following deficits and impairments:  Abnormal gait, Decreased range of motion, Difficulty walking, Increased muscle spasms, Cardiopulmonary status limiting activity, Decreased activity tolerance, Pain, Improper body mechanics, Impaired flexibility, Decreased balance, Decreased mobility, Decreased strength, Postural dysfunction  Visit Diagnosis: Muscle weakness (generalized)  Difficulty in walking, not elsewhere classified     Problem List Patient Active Problem List   Diagnosis Date Noted  . Fluid retention   . Generalized edema   . Hypokalemia   . Acute right-sided low back pain with right-sided sciatica   . Acute blood loss anemia   . Anemia of chronic disease   . Thrombocytopenia ([place])   . Diabetes mellitus ([place])   . Morbid obesity ([place])   . Benign essential HTN   . Hypoalbuminemia due to protein-calorie malnutrition ([place])   . Occult blood in stools   . Gastric polyp   . Lumbar discitis 05/17/2018  . Blood loss anemia 05/12/2018  . Hyperlipidemia associated with type 2 diabetes mellitus ([place]) 05/10/2018  . Essential hypertension 04/29/2018  . CKD (chronic kidney disease), stage IV ([place])   . Diet-controlled diabetes mellitus ([place])   . GERD (gastroesophageal reflux disease)   . Gout   . High cholesterol   . Hypothyroidism   . Iron deficiency anemia   . AV block, Mobitz 2 03/25/2018  . Mobitz type 2 second degree AV block 03/20/2018  . Aortic stenosis 03/20/2018  . Exertional dyspnea 03/19/2018    PAYSEUR,ANGIE PTA 11/20/2018, 10:06 AM  [place], [place], [address] Phone: [phone]   Fax:   [phone]  Name: CARALEE MOREA MRN: [id number] Date of Birth: 1946-07-24

## 2018-11-27 ENCOUNTER — Ambulatory Visit: Payer: Medicare Other | Admitting: Physical Therapy

## 2018-11-27 DIAGNOSIS — R262 Difficulty in walking, not elsewhere classified: Secondary | ICD-10-CM

## 2018-11-27 DIAGNOSIS — M6281 Muscle weakness (generalized): Secondary | ICD-10-CM

## 2018-11-27 NOTE — Therapy (Addendum)
[facility] [place], [place], [address] Phone: [phone]   Fax:  [phone]  Physical Therapy Treatment  Patient Details  Name: Erika Fry MRN: [id number] Date of Birth: 18-Jun-1946 Referring Provider (PT): Janus Molder, FNP   Encounter Date: 11/27/2018  PT End of Session - 11/27/18 1000    Visit Number  14    Date for PT Re-Evaluation  12/06/18    PT Start Time  0930    PT Stop Time  1010    PT Time Calculation (min)  40 min       Past Medical History:  Diagnosis Date  . Arthritis    "knees, thumbs" (03/25/2018)  . CKD (chronic kidney disease), stage IV ([place])   . Diet-controlled diabetes mellitus ([place])   . GERD (gastroesophageal reflux disease)   . Gout    "on daily RX" (03/25/2018)  . Heart murmur   . High cholesterol   . Hypertension   . Hypothyroidism   . Iron deficiency anemia    "had to get an iron infusion"  . Migraine    "used to have them growing up" (03/25/2018)  . Presence of permanent cardiac pacemaker 03/25/2018    Past Surgical History:  Procedure Laterality Date  . APPENDECTOMY    . BIOPSY  05/19/2018   Procedure: BIOPSY;  Surgeon: Ladene Artist, MD;  Location: [facility] ENDOSCOPY;  Service: Endoscopy;;  . ESOPHAGOGASTRODUODENOSCOPY N/A 05/19/2018   Procedure: ESOPHAGOGASTRODUODENOSCOPY (EGD);  Surgeon: Ladene Artist, MD;  Location: [facility] ENDOSCOPY;  Service: Endoscopy;  Laterality: N/A;  . INSERT / REPLACE / REMOVE PACEMAKER  03/25/2018  . IR FLUORO GUIDE CV LINE RIGHT  05/18/2018  . IR LUMBAR DISC ASPIRATION W/IMG GUIDE  05/15/2018  . IR REMOVAL TUN CV CATH W/O FL  07/23/2018  . IR US GUIDE VASC ACCESS RIGHT  05/18/2018  . KNEE ARTHROSCOPY Bilateral   . PACEMAKER IMPLANT N/A 03/25/2018   Procedure: PACEMAKER IMPLANT;  Surgeon: Evans Lance, MD;  Location: [place] CV LAB;  Service: Cardiovascular;  Laterality: N/A;  . PACEMAKER PLACEMENT Left 03/2018  . RIGHT HEART  CATH N/A 03/20/2018   Procedure: RIGHT HEART CATH;  Surgeon: Nigel Mormon, MD;  Location: [place] CV LAB;  Service: Cardiovascular;  Laterality: N/A;  . TEE WITHOUT CARDIOVERSION N/A 03/20/2018   Procedure: TRANSESOPHAGEAL ECHOCARDIOGRAM (TEE);  Surgeon: Nigel Mormon, MD;  Location: [facility] ENDOSCOPY;  Service: Cardiovascular;  Laterality: N/A;  . TONSILLECTOMY      There were no vitals filed for this visit.  Subjective Assessment - 11/27/18 0937    Subjective  I am so miserable with this UTI, even SOB I think with pain. I will do what I can my legs are weak.    Currently in Pain?  Yes    Pain Score  10-Worst pain ever    Pain Location  Back                       [facility] Adult PT Treatment/Exercise - 11/27/18 0001      Lumbar Exercises: Aerobic   Nustep  level 4 x 7 min      Lumbar Exercises: Machines for Strengthening   Cybex Lumbar Extension  black band 2 sets 10 flexion and ext    Other Lumbar Machine Exercise  lat pull and row 2 sets 15 20#      Knee/Hip Exercises: Machines for Strengthening   Cybex  Knee Extension  2x12 10lbs    Cybex Knee Flexion  2x12 25 lbs      Knee/Hip Exercises: Seated   Ball Squeeze  15    Clamshell with TheraBand  Blue    Marching  Strengthening;Both;15 reps   blue tband              PT Short Term Goals - 10/20/18 1014      PT SHORT TERM GOAL #1   Title  give good HEP and have pateint do at home    Time  2    Period  Weeks    Status  Achieved        PT Long Term Goals - 11/27/18 0001      PT LONG TERM GOAL #5   Title  LE strength to WNL's    Status  On-going      PT LONG TERM GOAL #6   Title  ambulate community distances without device    Status  Achieved            Plan - 11/27/18 1001    Clinical Impression Statement  all amb without SPC- goals met. LE MMT same and still fatigues with ex. pt struggled today d/t UTI so added some MH with ex as able    PT Next Visit Plan  walk outside        Patient will benefit from skilled therapeutic intervention in order to improve the following deficits and impairments:  Abnormal gait, Decreased range of motion, Difficulty walking, Increased muscle spasms, Cardiopulmonary status limiting activity, Decreased activity tolerance, Pain, Improper body mechanics, Impaired flexibility, Decreased balance, Decreased mobility, Decreased strength, Postural dysfunction  Visit Diagnosis: Muscle weakness (generalized)  Difficulty in walking, not elsewhere classified     Problem List Patient Active Problem List   Diagnosis Date Noted  . Fluid retention   . Generalized edema   . Hypokalemia   . Acute right-sided low back pain with right-sided sciatica   . Acute blood loss anemia   . Anemia of chronic disease   . Thrombocytopenia ([place])   . Diabetes mellitus ([place])   . Morbid obesity ([place])   . Benign essential HTN   . Hypoalbuminemia due to protein-calorie malnutrition ([place])   . Occult blood in stools   . Gastric polyp   . Lumbar discitis 05/17/2018  . Blood loss anemia 05/12/2018  . Hyperlipidemia associated with type 2 diabetes mellitus ([place]) 05/10/2018  . Essential hypertension 04/29/2018  . CKD (chronic kidney disease), stage IV ([place])   . Diet-controlled diabetes mellitus ([place])   . GERD (gastroesophageal reflux disease)   . Gout   . High cholesterol   . Hypothyroidism   . Iron deficiency anemia   . AV block, Mobitz 2 03/25/2018  . Mobitz type 2 second degree AV block 03/20/2018  . Aortic stenosis 03/20/2018  . Exertional dyspnea 03/19/2018   PHYSICAL THERAPY DISCHARGE SUMMARY   Plan: Patient agrees to discharge.  Patient goals were not met. Patient is being discharged due to a change in medical status.  ?????    Pt struggling with UTI and pelvic floor issues. Arrived 12/03/18 with increased pain and referral for PT for pelvic florr. Will D/C today and she has script for PT elsewhere for pelvic floor. If PT needed at later  date for back and LE weakness she can get new script to be reevaluated. Gael Delude,ANGIE PTA 11/27/2018, 10:02 AM  [place], [place], [address] Phone: [phone]   Fax:  [phone]  Name: Erika Fry MRN: [id number] Date of Birth: August 01, 1946

## 2018-12-01 ENCOUNTER — Ambulatory Visit: Payer: Medicare Other | Admitting: Physical Therapy

## 2018-12-03 ENCOUNTER — Ambulatory Visit: Payer: Medicare Other | Admitting: Physical Therapy

## 2018-12-10 ENCOUNTER — Ambulatory Visit: Payer: Medicare Other | Admitting: Physical Medicine & Rehabilitation

## 2018-12-21 ENCOUNTER — Encounter: Payer: Self-pay | Admitting: Internal Medicine

## 2018-12-21 ENCOUNTER — Ambulatory Visit: Payer: Medicare Other | Admitting: Internal Medicine

## 2018-12-21 VITALS — BP 102/67 | HR 82 | Temp 98.3°F | Wt 187.1 lb

## 2018-12-21 DIAGNOSIS — N184 Chronic kidney disease, stage 4 (severe): Secondary | ICD-10-CM | POA: Diagnosis not present

## 2018-12-21 DIAGNOSIS — R102 Pelvic and perineal pain: Secondary | ICD-10-CM

## 2018-12-21 DIAGNOSIS — D649 Anemia, unspecified: Secondary | ICD-10-CM

## 2018-12-21 DIAGNOSIS — M4646 Discitis, unspecified, lumbar region: Secondary | ICD-10-CM

## 2018-12-21 NOTE — Progress Notes (Signed)
RFV: follow up for L4-L5 discitis  Patient ID: Erika Fry, female   DOB: 06/07/1946, 73 y.o.   MRN: [id number]  HPI Erika Fry is a51yo F who has been treated for L4-L5 discitis for greater than 3 months, most recently on chronic suppression. She reports that she has had numerous doctors visits in attempting to determine pelvic discomfort that was once thought to be due to recurrent uti, but now being referred to urology for evaluation. She has urgency but not necessarily dysurian all the time. Then on occasion she does have pelvic pain without urgency. She denies have significant back pain but in part its probably due to her pelvis issue. She reports that she was given courses of abtx of bactrim without much improvement.  She denies fever  Outpatient Encounter Medications as of 12/21/2018  Medication Sig  . acetaminophen (TYLENOL) 325 MG tablet Take 2 tablets (650 mg total) by mouth 4 (four) times daily -  before meals and at bedtime.  [name] Kitchen allopurinol (ZYLOPRIM) 100 MG tablet Take 2 tablets (200 mg total) by mouth daily.  [name] Kitchen amLODipine (NORVASC) 10 MG tablet Take 1 tablet (10 mg total) by mouth daily.  . cetirizine (ZYRTEC) 10 MG tablet Take 10 mg by mouth at bedtime.   [name] Kitchen doxycycline (VIBRA-TABS) 100 MG tablet Take 1 tablet (100 mg total) by mouth 2 (two) times daily.  . ferrous sulfate 325 (65 FE) MG tablet Take 1 tablet (325 mg total) by mouth 2 (two) times daily with a meal.  . fluticasone (FLONASE) 50 MCG/ACT nasal spray Place 2 sprays into both nostrils daily as needed for allergies or rhinitis.  . folic acid (FOLVITE) 1 MG tablet Take 1 tablet (1 mg total) by mouth daily.  . furosemide (LASIX) 80 MG tablet Take 1 tablet (80 mg total) by mouth 2 (two) times daily. (Patient taking differently: Take 80 mg by mouth daily. )  . gabapentin (NEURONTIN) 400 MG capsule Take 1 capsule (400 mg total) by mouth at bedtime.  . hydrALAZINE (APRESOLINE) 50 MG tablet Take 1.5 tablets (75 mg total)  by mouth 3 (three) times daily.  . Insulin Glargine (LANTUS) 100 UNIT/ML Solostar Pen Inject 20 Units into the skin daily.  . Insulin Pen Needle 31G X 6 MM MISC 1 application by Does not apply route daily.  [name] Kitchen levothyroxine (SYNTHROID, LEVOTHROID) 75 MCG tablet Take 75 mcg by mouth daily before breakfast.  . lidocaine (LIDODERM) 5 % Place 1 patch onto the skin daily. Available over the counter  . Melatonin 3 MG TABS Take 0.5 tablets (1.5 mg total) by mouth at bedtime.  . Multiple Vitamin (MULTI-VITAMIN PO) Take 1 tablet by mouth daily.  [name] Kitchen oxyCODONE (OXY IR/ROXICODONE) 5 MG immediate release tablet Take 1 tablet (5 mg total) by mouth every 6 (six) hours as needed for severe pain.  . pantoprazole (PROTONIX) 40 MG tablet Take 1 tablet (40 mg total) by mouth daily at 6 (six) AM.  . polyethylene glycol (MIRALAX / GLYCOLAX) packet Take 17 g by mouth daily as needed.  . potassium chloride (K-DUR) 10 MEQ tablet Take 1 tablet (10 mEq total) by mouth 3 (three) times daily.  [name] Kitchen senna (SENOKOT) 8.6 MG TABS tablet Take 1 tablet (8.6 mg total) by mouth daily.  . simvastatin (ZOCOR) 40 MG tablet Take 0.5 tablets (20 mg total) by mouth at bedtime.  [name] Kitchen tiZANidine (ZANAFLEX) 2 MG tablet Take 1 tablet (2 mg total) by mouth daily.  . vitamin B-12 (CYANOCOBALAMIN) 1000 MCG tablet  Take 1 tablet (1,000 mcg total) by mouth daily.   No facility-administered encounter medications on file as of 12/21/2018.      Patient Active Problem List   Diagnosis Date Noted  . Fluid retention   . Generalized edema   . Hypokalemia   . Acute right-sided low back pain with right-sided sciatica   . Acute blood loss anemia   . Anemia of chronic disease   . Thrombocytopenia ([place])   . Diabetes mellitus ([place])   . Morbid obesity ([place])   . Benign essential HTN   . Hypoalbuminemia due to protein-calorie malnutrition ([place])   . Occult blood in stools   . Gastric polyp   . Lumbar discitis 05/17/2018  . Blood loss anemia 05/12/2018  .  Hyperlipidemia associated with type 2 diabetes mellitus ([place]) 05/10/2018  . Essential hypertension 04/29/2018  . CKD (chronic kidney disease), stage IV ([place])   . Diet-controlled diabetes mellitus ([place])   . GERD (gastroesophageal reflux disease)   . Gout   . High cholesterol   . Hypothyroidism   . Iron deficiency anemia   . AV block, Mobitz 2 03/25/2018  . Mobitz type 2 second degree AV block 03/20/2018  . Aortic stenosis 03/20/2018  . Exertional dyspnea 03/19/2018     Health Maintenance Due  Topic Date Due  . Hepatitis C Screening  [birth year]  . FOOT EXAM  04/16/1954  . OPHTHALMOLOGY EXAM  04/16/1954  . TETANUS/TDAP  04/17/1963  . MAMMOGRAM  04/16/1994  . COLONOSCOPY  04/16/1994  . DEXA SCAN  04/16/2009  . HEMOGLOBIN A1C  10/31/2018     Review of Systems 12 point ros is negative except what is mentioned in hpi Physical Exam   BP 102/67   Pulse 82   Temp 98.3 F (36.8 C) (Oral)   Wt 187 lb 1.9 oz (84.9 kg)   BMI 29.31 kg/m   Physical Exam  Constitutional:  oriented to person, place, and time. appears well-developed and well-nourished. No distress.  HENT: Erika Fry/AT, PERRLA, no scleral icterus Mouth/Throat: Oropharynx is clear and moist. No oropharyngeal exudate.  Cardiovascular: Normal rate, regular rhythm and normal heart sounds. Exam reveals no gallop and no friction rub.  No murmur heard.  Pulmonary/Chest: Effort normal and breath sounds normal. No respiratory distress.  has no wheezes.  Abdominal: Soft. Bowel sounds are normal.  exhibits no distension. There is no tenderness.  Lymphadenopathy: no cervical adenopathy. No axillary adenopathy Neurological: alert and oriented to person, place, and time.  Skin: Skin is warm and dry. No rash noted. No erythema.  Psychiatric: a normal mood and affect.  behavior is normal.   CBC Lab Results  Component Value Date   WBC 10.7 09/14/2018   RBC 3.59 (L) 09/14/2018   HGB 10.9 (L) 09/14/2018   HCT 32.3 (L) 09/14/2018   PLT  193 09/14/2018   MCV 90.0 09/14/2018   MCH 30.4 09/14/2018   MCHC 33.7 09/14/2018   RDW 14.1 09/14/2018   LYMPHSABS 2,151 09/14/2018   MONOABS 0.5 06/18/2018   EOSABS 556 (H) 09/14/2018    BMET Lab Results  Component Value Date   NA 138 09/14/2018   K 3.8 09/14/2018   CL 98 09/14/2018   CO2 26 09/14/2018   GLUCOSE 143 (H) 09/14/2018   BUN 68 (H) 09/14/2018   CREATININE 2.81 (H) 09/14/2018   CALCIUM 10.9 (H) 09/14/2018   GFRNONAA 17 (L) 07/02/2018   GFRAA 20 (L) 07/02/2018      Assessment and Plan   Follow  up with urology this aftenoon for pelvic/bladder pain Follow up with renal for worsening le edema in setting of ckd 4 Fatigue = will check hgb Discitis = has been on prolonged course of doxy. Will stop today. And check inflam markers which maybe elevated for the above reasons

## 2018-12-22 ENCOUNTER — Other Ambulatory Visit: Payer: Self-pay | Admitting: Urology

## 2018-12-22 ENCOUNTER — Telehealth: Payer: Self-pay | Admitting: *Deleted

## 2018-12-22 LAB — CBC WITH DIFFERENTIAL/PLATELET
Absolute Monocytes: 786 cells/uL (ref 200–950)
BASOS ABS: 73 {cells}/uL (ref 0–200)
Basophils Relative: 0.9 %
EOS ABS: 599 {cells}/uL — AB (ref 15–500)
Eosinophils Relative: 7.4 %
HCT: 24.8 % — ABNORMAL LOW (ref 35.0–45.0)
Hemoglobin: 8.4 g/dL — ABNORMAL LOW (ref 11.7–15.5)
Lymphs Abs: 1709 cells/uL (ref 850–3900)
MCH: 31.1 pg (ref 27.0–33.0)
MCHC: 33.9 g/dL (ref 32.0–36.0)
MCV: 91.9 fL (ref 80.0–100.0)
MONOS PCT: 9.7 %
MPV: 10.2 fL (ref 7.5–12.5)
NEUTROS PCT: 60.9 %
Neutro Abs: 4933 cells/uL (ref 1500–7800)
PLATELETS: 184 10*3/uL (ref 140–400)
RBC: 2.7 10*6/uL — ABNORMAL LOW (ref 3.80–5.10)
RDW: 13.5 % (ref 11.0–15.0)
TOTAL LYMPHOCYTE: 21.1 %
WBC: 8.1 10*3/uL (ref 3.8–10.8)

## 2018-12-22 LAB — C-REACTIVE PROTEIN: CRP: 21.7 mg/L — AB (ref <8.0)

## 2018-12-22 LAB — BASIC METABOLIC PANEL
BUN / CREAT RATIO: 24 (calc) — AB (ref 6–22)
BUN: 79 mg/dL — ABNORMAL HIGH (ref 7–25)
CHLORIDE: 101 mmol/L (ref 98–110)
CO2: 25 mmol/L (ref 20–32)
Calcium: 9.8 mg/dL (ref 8.6–10.4)
Creat: 3.24 mg/dL — ABNORMAL HIGH (ref 0.60–0.93)
GLUCOSE: 137 mg/dL — AB (ref 65–99)
Potassium: 3.3 mmol/L — ABNORMAL LOW (ref 3.5–5.3)
Sodium: 139 mmol/L (ref 135–146)

## 2018-12-22 LAB — SEDIMENTATION RATE: SED RATE: 104 mm/h — AB (ref 0–30)

## 2018-12-22 NOTE — Telephone Encounter (Signed)
Patient's daughter called for lab results. Please advise. Landis Gandy, RN

## 2018-12-23 ENCOUNTER — Telehealth: Payer: Self-pay

## 2018-12-23 NOTE — Telephone Encounter (Signed)
Her markers for inflammation remain elevated but I wonder if it is related to pain associated with bladder for which she is being followed by urology

## 2018-12-23 NOTE — Telephone Encounter (Signed)
Patient daughter calling office today regarding lab work. Was able to inform patient's daughter that her mother's inflammation remains elevated. Was able to give patient's daughter result value for blood glucose  and hemoglobin.  Patient's daughter would also like to know what next steps would be since patient's Doxy was stopped on 1/20. Will route message to Dr. Baxter Flattery to advise. [place]

## 2018-12-30 NOTE — Telephone Encounter (Signed)
Called patient's daughter to follow-up on call from 1/22. Left message stating that our office is monitoring patient off of antibiotics at this time. Advised that patient and daughter monitor symptoms and call if they had any further questions concerns, Aundria Rud, [place]

## 2018-12-30 NOTE — Telephone Encounter (Signed)
Can you call the patient's daughter that we are following her off of abtx and seeing how she does.

## 2019-01-12 NOTE — Patient Instructions (Addendum)
Erika Fry  01/12/2019   Your procedure is scheduled on: Thursday 01/14/2019  Report to [facility] Main  Entrance              Report to admitting at  0530 AM    Call this number if you have problems the morning of surgery [phone]     Remember: Do not eat food or drink liquids :After Midnight.             BRUSH YOUR TEETH MORNING OF SURGERY AND RINSE YOUR MOUTH OUT, NO CHEWING GUM CANDY OR MINTS.     Take these medicines the morning of surgery with A SIP OF WATER: Famotidine (Pepcid), Levothyroxine (Synthroid)                                    You may not have any metal on your body including hair pins and              piercings  Do not wear jewelry, make-up, lotions, powders or perfumes, deodorant             Do not wear nail polish.  Do not shave  48 hours prior to surgery.             Do not bring valuables to the hospital. [place].  Contacts, dentures or bridgework may not be worn into surgery.  Leave suitcase in the car. After surgery it may be brought to your room.     Patients discharged the day of surgery will not be allowed to drive home. IF YOU ARE HAVING SURGERY AND GOING HOME THE SAME DAY, YOU MUST HAVE AN ADULT TO DRIVE YOU HOME AND BE WITH YOU FOR 24 HOURS. YOU MAY GO HOME BY TAXI OR UBER OR ORTHERWISE, BUT AN ADULT MUST ACCOMPANY YOU HOME AND STAY WITH YOU FOR 24 HOURS.  Name and phone number of your driver:daughter-in law-Jennifer                Please read over the following fact sheets you were given: _____________________________________________________________________             [facility] - Preparing for Surgery Before surgery, you can play an important role.  Because skin is not sterile, your skin needs to be as free of germs as possible.  You can reduce the number of germs on your skin by washing with CHG (chlorahexidine gluconate) soap before surgery.  CHG  is an antiseptic cleaner which kills germs and bonds with the skin to continue killing germs even after washing. Please DO NOT use if you have an allergy to CHG or antibacterial soaps.  If your skin becomes reddened/irritated stop using the CHG and inform your nurse when you arrive at Short Stay. Do not shave (including legs and underarms) for at least 48 hours prior to the first CHG shower.  You may shave your face/neck. Please follow these instructions carefully:  1.  Shower with CHG Soap the night before surgery and the  morning of Surgery.  2.  If you choose to wash your hair, wash your hair first as usual with your  normal  shampoo.  3.  After you shampoo, rinse your hair  and body thoroughly to remove the  shampoo.                           4.  Use CHG as you would any other liquid soap.  You can apply chg directly  to the skin and wash                       Gently with a scrungie or clean washcloth.  5.  Apply the CHG Soap to your body ONLY FROM THE NECK DOWN.   Do not use on face/ open                           Wound or open sores. Avoid contact with eyes, ears mouth and genitals (private parts).                       Wash face,  Genitals (private parts) with your normal soap.             6.  Wash thoroughly, paying special attention to the area where your surgery  will be performed.  7.  Thoroughly rinse your body with warm water from the neck down.  8.  DO NOT shower/wash with your normal soap after using and rinsing off  the CHG Soap.                9.  Pat yourself dry with a clean towel.            10.  Wear clean pajamas.            11.  Place clean sheets on your bed the night of your first shower and do not  sleep with pets. Day of Surgery : Do not apply any lotions/deodorants the morning of surgery.  Please wear clean clothes to the hospital/surgery center.  FAILURE TO FOLLOW THESE INSTRUCTIONS MAY RESULT IN THE CANCELLATION OF YOUR SURGERY PATIENT  SIGNATURE_________________________________  NURSE SIGNATURE__________________________________  ________________________________________________________________________

## 2019-01-12 NOTE — Progress Notes (Signed)
12/21/2018- noted in [place] visit with Dr. Carlyle Basques, Infectious Disease  06/15/2018- noted in Epic-CXR  05/28/2018- noted in [place]  04/07/2018- noted in Epic-Last device check of Pacemaker

## 2019-01-13 ENCOUNTER — Encounter (HOSPITAL_COMMUNITY): Payer: Self-pay

## 2019-01-13 ENCOUNTER — Encounter (HOSPITAL_COMMUNITY)
Admission: RE | Admit: 2019-01-13 | Discharge: 2019-01-13 | Disposition: A | Discharge status: Home / Self Care | Payer: Medicare Other | Source: Ambulatory Visit

## 2019-01-13 ENCOUNTER — Other Ambulatory Visit: Payer: Self-pay

## 2019-01-13 DIAGNOSIS — E039 Hypothyroidism, unspecified: Secondary | ICD-10-CM

## 2019-01-13 DIAGNOSIS — M109 Gout, unspecified: Secondary | ICD-10-CM

## 2019-01-13 DIAGNOSIS — Z79899 Other long term (current) drug therapy: Secondary | ICD-10-CM

## 2019-01-13 DIAGNOSIS — M1990 Unspecified osteoarthritis, unspecified site: Secondary | ICD-10-CM | POA: Insufficient documentation

## 2019-01-13 DIAGNOSIS — Z794 Long term (current) use of insulin: Secondary | ICD-10-CM | POA: Insufficient documentation

## 2019-01-13 DIAGNOSIS — E1122 Type 2 diabetes mellitus with diabetic chronic kidney disease: Secondary | ICD-10-CM

## 2019-01-13 DIAGNOSIS — I340 Nonrheumatic mitral (valve) insufficiency: Secondary | ICD-10-CM

## 2019-01-13 DIAGNOSIS — R102 Pelvic and perineal pain: Secondary | ICD-10-CM | POA: Insufficient documentation

## 2019-01-13 DIAGNOSIS — Z950 Presence of cardiac pacemaker: Secondary | ICD-10-CM | POA: Insufficient documentation

## 2019-01-13 DIAGNOSIS — I129 Hypertensive chronic kidney disease with stage 1 through stage 4 chronic kidney disease, or unspecified chronic kidney disease: Secondary | ICD-10-CM | POA: Insufficient documentation

## 2019-01-13 DIAGNOSIS — E7800 Pure hypercholesterolemia, unspecified: Secondary | ICD-10-CM

## 2019-01-13 DIAGNOSIS — N390 Urinary tract infection, site not specified: Secondary | ICD-10-CM | POA: Diagnosis not present

## 2019-01-13 DIAGNOSIS — R319 Hematuria, unspecified: Secondary | ICD-10-CM | POA: Insufficient documentation

## 2019-01-13 DIAGNOSIS — K219 Gastro-esophageal reflux disease without esophagitis: Secondary | ICD-10-CM | POA: Insufficient documentation

## 2019-01-13 DIAGNOSIS — Z01812 Encounter for preprocedural laboratory examination: Secondary | ICD-10-CM | POA: Insufficient documentation

## 2019-01-13 DIAGNOSIS — N184 Chronic kidney disease, stage 4 (severe): Secondary | ICD-10-CM

## 2019-01-13 DIAGNOSIS — E860 Dehydration: Secondary | ICD-10-CM | POA: Diagnosis not present

## 2019-01-13 LAB — GLUCOSE, CAPILLARY: GLUCOSE-CAPILLARY: 115 mg/dL — AB (ref 70–99)

## 2019-01-13 LAB — BASIC METABOLIC PANEL
Anion gap: 13 (ref 5–15)
BUN: 94 mg/dL — ABNORMAL HIGH (ref 8–23)
CO2: 23 mmol/L (ref 22–32)
Calcium: 10.1 mg/dL (ref 8.9–10.3)
Chloride: 100 mmol/L (ref 98–111)
Creatinine, Ser: 5.34 mg/dL — ABNORMAL HIGH (ref 0.44–1.00)
GFR calc Af Amer: 8 mL/min — ABNORMAL LOW (ref >60)
GFR calc non Af Amer: 7 mL/min — ABNORMAL LOW (ref >60)
GLUCOSE: 114 mg/dL — AB (ref 70–99)
Potassium: 3.5 mmol/L (ref 3.5–5.1)
Sodium: 136 mmol/L (ref 135–145)

## 2019-01-13 LAB — CBC
HCT: 27.2 % — ABNORMAL LOW (ref 36.0–46.0)
Hemoglobin: 8.6 g/dL — ABNORMAL LOW (ref 12.0–15.0)
MCH: 30.1 pg (ref 26.0–34.0)
MCHC: 31.6 g/dL (ref 30.0–36.0)
MCV: 95.1 fL (ref 80.0–100.0)
NRBC: 0 % (ref 0.0–0.2)
PLATELETS: 246 10*3/uL (ref 150–400)
RBC: 2.86 MIL/uL — ABNORMAL LOW (ref 3.87–5.11)
RDW: 13.7 % (ref 11.5–15.5)
WBC: 7.9 10*3/uL (ref 4.0–10.5)

## 2019-01-13 NOTE — Anesthesia Preprocedure Evaluation (Addendum)
Anesthesia Evaluation  Patient identified by MRN, date of birth, ID band  Reviewed: Allergy & Precautions, NPO status , Patient's Chart, lab work & pertinent test results  Airway Mallampati: II  TM Distance: >3 FB Neck ROM: Full    Dental  (+) Dental Advisory Given, Teeth Intact   Pulmonary neg pulmonary ROS,    Pulmonary exam normal breath sounds clear to auscultation       Cardiovascular hypertension, Pt. on medications Normal cardiovascular exam+ dysrhythmias + pacemaker + Valvular Problems/Murmurs MR  Rhythm:Regular Rate:Normal  Echo 03/2018 - Left ventricle: There was moderate concentric hypertrophy with severe basal septal hypertrophy- likely reason for LVOT gradient. No SAM, no sub aortic membrane. Hyperdynamic LV> LVEF >70%. Wall motion was normal; there were no regional wall motion abnormalities. - Aortic valve: Mild annular calcification. Normal leaflets with normal excursion. Mean PG 33 mmHg, Vmax 4 m/sec, however gradient arising throughout LV outflow tract without significant aortic valvular stenosis. AVA by continuity equation at least 1.4 cm2. - Mitral valve: Mildly to moderately calcified annulus. Normal thickness leaflets . There was mild regurgitation. - Left atrium: No evidence of thrombus in the atrial cavity or appendage.  Impressions: - Hyperdynamic LV with severe basal septal hypertrophy and LVOT obstruction. No significant valvular stenosis. No SAM, no  subaortic membrane. Mild mitral regurgitation.   RHC 03/2018 RA pressure 3 mmhg RVSP 48 mmHg, RVEDP 6 mmhg PAP 42/9 mmhg, Mean Pap 20 mmhg CO 6.8 L/min, CI 3.3 L/min/m2  No pulmonary hypertension Normal cardiac output    Neuro/Psych  Headaches,    GI/Hepatic Neg liver ROS, GERD  ,  Endo/Other  diabetes, Type 2, Insulin DependentHypothyroidism   Renal/GU Renal disease     Musculoskeletal  (+) Arthritis ,   Abdominal   Peds   Hematology negative hematology ROS (+) anemia ,   Anesthesia Other Findings Day of surgery medications reviewed with the patient.  Reproductive/Obstetrics                                                          Anesthesia Evaluation  Patient identified by MRN, date of birth, ID band Patient awake    Reviewed: Allergy & Precautions, NPO status , Patient's Chart, lab work & pertinent test results  History of Anesthesia Complications Negative for: history of anesthetic complications  Airway Mallampati: II  TM Distance: >3 FB Neck ROM: Full    Dental  (+) Dental Advisory Given   Pulmonary neg pulmonary ROS,    breath sounds clear to auscultation       Cardiovascular hypertension, Pt. on medications + dysrhythmias + pacemaker + Valvular Problems/Murmurs AS  Rhythm:Regular Rate:Normal + Systolic murmurs 8/25 ECHO: EF 70%, Aortic valve Normal leaflets with normal excursion. Mean PG 33 mmHg, Vmax 4 m/sec, however gradient arising throughout LV outflow tract without significant aortic valvular stenosis. AVA by continuity equation at least 1.4 cm2. Mean grad 26 mmHg, max 49 mmHg   Neuro/Psych Does not ambulate: severe back pain, requires steroids    GI/Hepatic GERD  Medicated,Presumed GI bleed   Endo/Other  diabetes (glu 141), Insulin DependentHypothyroidism Morbid obesity  Renal/GU Renal InsufficiencyRenal disease (creat 1.92)     Musculoskeletal  (+) Arthritis ,   Abdominal (+) + obese,   Peds  Hematology INR 1.01   Anesthesia Other Findings  Reproductive/Obstetrics                            Anesthesia Physical Anesthesia Plan  ASA: III  Anesthesia Plan: MAC   Post-op Pain Management:    Induction:   PONV Risk Score and Plan: 2 and Treatment may vary due to age or medical condition  Airway Management Planned: Natural Airway and Nasal Cannula  Additional Equipment:   Intra-op Plan:    Post-operative Plan:   Informed Consent: I have reviewed the patients History and Physical, chart, labs and discussed the procedure including the risks, benefits and alternatives for the proposed anesthesia with the patient or authorized representative who has indicated his/her understanding and acceptance.   Dental advisory given  Plan Discussed with:   Anesthesia Plan Comments: (Plan routine monitors, MAC)       Anesthesia Quick Evaluation  Anesthesia Physical Anesthesia Plan  ASA: III  Anesthesia Plan: General   Post-op Pain Management:    Induction: Intravenous  PONV Risk Score and Plan: 4 or greater and Ondansetron, Dexamethasone and Treatment may vary due to age or medical condition  Airway Management Planned: LMA  Additional Equipment: None  Intra-op Plan:   Post-operative Plan: Extubation in OR  Informed Consent: I have reviewed the patients History and Physical, chart, labs and discussed the procedure including the risks, benefits and alternatives for the proposed anesthesia with the patient or authorized representative who has indicated his/her understanding and acceptance.     Dental advisory given  Plan Discussed with: CRNA  Anesthesia Plan Comments: (See PST note 01/13/19, Konrad Felix, PA-C  No pacemaker interrogation information sent with patient though it was requested many times PAT. Calling medtronic to interrogate in preop today.)      Anesthesia Quick Evaluation

## 2019-01-13 NOTE — Progress Notes (Addendum)
Anesthesia Chart Review   Case:  [id number] Date/Time:  01/14/19 0715   Procedure:  CYSTOSCOPY WITH RETROGRADE PYELOGRAM POSSIBLE HYDRODISTENTION (Bilateral )   Anesthesia type:  General   Pre-op diagnosis:  PELVIC PAIN, HEMATURIA   Location:  [facility] PROCEDURE ROOM / [facility]   Surgeon:  Ardis Hughs, MD      DISCUSSION: 73 yo never smoker with h/o HTN, ICD (implanted 03/25/18), DM II, CKD stage IV, anemia, hypothyroidism, GERD, pelvic pain, hematuria scheduled for above surgery on 01/14/19 with Dr. Louis Meckel  ICD implanted 03/25/18 due to symptomatic 2:1 heart block.  Last documented device check 04/07/18 with returned follow up recommended for 06/30/18, pt did not return due to hospitalization for discitis.  Pt reports she had a more recent device check 08/13/18, notes requested. ICD perioperative prescription request, pending.   Pt is being followed by infectious disease, Dr. Carlyle Basques, due to recent discitis.  Hospital admission 05/12/18-05/22/18 with inpatient rehab stay 05/22/18-06/24/18.  She was last seen on 12/21/2018.  Per Dr. Storm Frisk note pt has been on prolonged course of doxy and antibiotic stopped at that visit.  She has a follow up appointment 02/01/2019.   Addendum 4:52PM: Creatinine 5.34 at PST visit 01/13/19.  Creatinine has ranged from 2.25-3.24 over the last 6 months.  I have contacted nephrology office, CMA reports she was seen on Monday 01/11/19.  Requested notes.  On call nephrologist paged with no return call at this time.  Discussed with Dr. Fransisco Beau.  Will assess in the am and discuss with Dr. Louis Meckel.  VS: BP (!) 149/67   Pulse 88   Temp 36.6 C (Oral)   Resp 18   Ht 5\' 5"  (1.651 m)   SpO2 98%   BMI 31.14 kg/m   PROVIDERS: Avva, Ravisankar, MD is PCP   Kela Millin, MD is Cardiology LABS:  (all labs ordered are listed, but only abnormal results are displayed)  Labs Reviewed  BASIC METABOLIC PANEL - Abnormal; Notable for the following components:   Result Value   Glucose, Bld 114 (*)    BUN 94 (*)    Creatinine, Ser 5.34 (*)    GFR calc non Af Amer 7 (*)    GFR calc Af Amer 8 (*)    All other components within normal limits  CBC - Abnormal; Notable for the following components:   RBC 2.86 (*)    Hemoglobin 8.6 (*)    HCT 27.2 (*)    All other components within normal limits  GLUCOSE, CAPILLARY - Abnormal; Notable for the following components:   Glucose-Capillary 115 (*)    All other components within normal limits     IMAGES:   EKG: 05/28/18 Rate 113 bpm Atrial-based ventricular-paced rhyhtm Abnormal ECG  CV: Echo 03/20/18 Study Conclusions  - Left ventricle: There was moderate concentric hypertrophy with   severe basal septal hypertrophy- likely reason for LVOT gradient.   No SAM, no sub aortic membrane. Hyperdynamic LV> LVEF >70%. Wall   motion was normal; there were no regional wall motion   abnormalities. - Aortic valve: Mild annular calcification. Normal leaflets with   normal excursion. Mean PG 33 mmHg, Vmax 4 m/sec, however gradient   arising throughout LV outflow tract without significant aortic   valvular stenosis. AVA by continuity equation at least 1.4 cm2. - Mitral valve: Mildly to moderately calcified annulus. Normal   thickness leaflets . There was mild regurgitation. - Left atrium: No evidence of thrombus in the atrial cavity or  appendage.  Impressions:  - Hyperdynamic LV with severe basal septal hypertrophy and LVOT   obstruction. No significant valvular stenosis. No SAM, no   subaortic membrane. Mild mitral regurgitation.  Cardiac Cath 03/20/18 RA pressure 3 mmhg RVSP 48 mmHg, RVEDP 6 mmhg PAP 42/9 mmhg, Mean Pap 20 mmhg CO 6.8 L/min, CI 3.3 L/min/m2  No pulmonary hypertension Normal cardiac output Past Medical History:  Diagnosis Date  . Arthritis    "knees, thumbs" (03/25/2018)  . CKD (chronic kidney disease), stage IV ([place])   . Diet-controlled diabetes mellitus ([place])   .  GERD (gastroesophageal reflux disease)   . Gout    "on daily RX" (03/25/2018)  . Heart murmur   . High cholesterol   . Hypertension   . Hypothyroidism   . Iron deficiency anemia    "had to get an iron infusion"  . Migraine    "used to have them growing up" (03/25/2018)  . Presence of permanent cardiac pacemaker 03/25/2018    Past Surgical History:  Procedure Laterality Date  . APPENDECTOMY    . BIOPSY  05/19/2018   Procedure: BIOPSY;  Surgeon: Ladene Artist, MD;  Location: [facility] ENDOSCOPY;  Service: Endoscopy;;  . ESOPHAGOGASTRODUODENOSCOPY N/A 05/19/2018   Procedure: ESOPHAGOGASTRODUODENOSCOPY (EGD);  Surgeon: Ladene Artist, MD;  Location: [facility] ENDOSCOPY;  Service: Endoscopy;  Laterality: N/A;  . INSERT / REPLACE / REMOVE PACEMAKER  03/25/2018  . IR FLUORO GUIDE CV LINE RIGHT  05/18/2018  . IR LUMBAR DISC ASPIRATION W/IMG GUIDE  05/15/2018  . IR REMOVAL TUN CV CATH W/O FL  07/23/2018  . IR US GUIDE VASC ACCESS RIGHT  05/18/2018  . KNEE ARTHROSCOPY Bilateral   . PACEMAKER IMPLANT N/A 03/25/2018   Procedure: PACEMAKER IMPLANT;  Surgeon: Evans Lance, MD;  Location: [place] CV LAB;  Service: Cardiovascular;  Laterality: N/A;  . PACEMAKER PLACEMENT Left 03/2018  . RIGHT HEART CATH N/A 03/20/2018   Procedure: RIGHT HEART CATH;  Surgeon: Nigel Mormon, MD;  Location: [place] CV LAB;  Service: Cardiovascular;  Laterality: N/A;  . TEE WITHOUT CARDIOVERSION N/A 03/20/2018   Procedure: TRANSESOPHAGEAL ECHOCARDIOGRAM (TEE);  Surgeon: Nigel Mormon, MD;  Location: [facility] ENDOSCOPY;  Service: Cardiovascular;  Laterality: N/A;  . TONSILLECTOMY      MEDICATIONS: . acetaminophen (TYLENOL) 325 MG tablet  . allopurinol (ZYLOPRIM) 100 MG tablet  . Ascorbic Acid (VITAMIN C PO)  . Biotin 5 MG CAPS  . cetirizine (ZYRTEC) 10 MG tablet  . diazepam (VALIUM) 10 MG tablet  . famotidine (PEPCID) 10 MG tablet  . ferrous sulfate 325 (65 FE) MG tablet  . fluticasone (FLONASE) 50 MCG/ACT  nasal spray  . folic acid (FOLVITE) 1 MG tablet  . furosemide (LASIX) 80 MG tablet  . Insulin Glargine (LANTUS) 100 UNIT/ML Solostar Pen  . Insulin Pen Needle 31G X 6 MM MISC  . levothyroxine (SYNTHROID, LEVOTHROID) 75 MCG tablet  . Lidocaine 5 % CREA  . mirabegron ER (MYRBETRIQ) 50 MG TB24 tablet  . Multiple Vitamin (MULTI-VITAMIN PO)  . Phenazopyridine HCl (AZO-STANDARD PO)  . potassium chloride (K-DUR) 10 MEQ tablet  . simvastatin (ZOCOR) 40 MG tablet  . vitamin B-12 (CYANOCOBALAMIN) 1000 MCG tablet   No current facility-administered medications for this encounter.     Maia Plan [facility] Pre-Surgical Testing [phone] 01/13/19 3:13 PM

## 2019-01-13 NOTE — Progress Notes (Signed)
01/07/2019- Labs from Dr. Dagmar Hait on chart -5.1

## 2019-01-14 ENCOUNTER — Encounter (HOSPITAL_COMMUNITY): Payer: Self-pay

## 2019-01-14 ENCOUNTER — Ambulatory Visit (HOSPITAL_COMMUNITY): Payer: Medicare Other

## 2019-01-14 ENCOUNTER — Ambulatory Visit (HOSPITAL_COMMUNITY): Payer: Medicare Other | Admitting: Anesthesiology

## 2019-01-14 ENCOUNTER — Other Ambulatory Visit: Payer: Self-pay

## 2019-01-14 ENCOUNTER — Observation Stay (HOSPITAL_COMMUNITY)
Admission: RE | Admit: 2019-01-14 | Discharge: 2019-01-15 | Disposition: A | Discharge status: Home / Self Care | Payer: Medicare Other | Source: Home / Self Care | Attending: Urology | Admitting: Urology

## 2019-01-14 ENCOUNTER — Encounter (HOSPITAL_COMMUNITY)
Admission: RE | Disposition: A | Discharge status: Home / Self Care | Payer: Self-pay | Source: Home / Self Care | Attending: Urology

## 2019-01-14 ENCOUNTER — Ambulatory Visit (HOSPITAL_COMMUNITY): Payer: Medicare Other | Admitting: Physician Assistant

## 2019-01-14 DIAGNOSIS — Z6830 Body mass index (BMI) 30.0-30.9, adult: Secondary | ICD-10-CM

## 2019-01-14 DIAGNOSIS — N3090 Cystitis, unspecified without hematuria: Secondary | ICD-10-CM | POA: Insufficient documentation

## 2019-01-14 DIAGNOSIS — R102 Pelvic and perineal pain: Secondary | ICD-10-CM | POA: Insufficient documentation

## 2019-01-14 DIAGNOSIS — G8929 Other chronic pain: Secondary | ICD-10-CM | POA: Insufficient documentation

## 2019-01-14 DIAGNOSIS — R310 Gross hematuria: Secondary | ICD-10-CM | POA: Diagnosis present

## 2019-01-14 DIAGNOSIS — N184 Chronic kidney disease, stage 4 (severe): Secondary | ICD-10-CM | POA: Insufficient documentation

## 2019-01-14 DIAGNOSIS — I441 Atrioventricular block, second degree: Secondary | ICD-10-CM | POA: Insufficient documentation

## 2019-01-14 DIAGNOSIS — Z794 Long term (current) use of insulin: Secondary | ICD-10-CM | POA: Insufficient documentation

## 2019-01-14 DIAGNOSIS — Z79899 Other long term (current) drug therapy: Secondary | ICD-10-CM | POA: Insufficient documentation

## 2019-01-14 DIAGNOSIS — I350 Nonrheumatic aortic (valve) stenosis: Secondary | ICD-10-CM | POA: Insufficient documentation

## 2019-01-14 DIAGNOSIS — I129 Hypertensive chronic kidney disease with stage 1 through stage 4 chronic kidney disease, or unspecified chronic kidney disease: Secondary | ICD-10-CM | POA: Insufficient documentation

## 2019-01-14 DIAGNOSIS — K317 Polyp of stomach and duodenum: Secondary | ICD-10-CM | POA: Insufficient documentation

## 2019-01-14 DIAGNOSIS — Z841 Family history of disorders of kidney and ureter: Secondary | ICD-10-CM | POA: Insufficient documentation

## 2019-01-14 DIAGNOSIS — M5441 Lumbago with sciatica, right side: Secondary | ICD-10-CM

## 2019-01-14 DIAGNOSIS — E876 Hypokalemia: Secondary | ICD-10-CM

## 2019-01-14 DIAGNOSIS — D696 Thrombocytopenia, unspecified: Secondary | ICD-10-CM

## 2019-01-14 DIAGNOSIS — N200 Calculus of kidney: Secondary | ICD-10-CM | POA: Insufficient documentation

## 2019-01-14 DIAGNOSIS — E785 Hyperlipidemia, unspecified: Secondary | ICD-10-CM

## 2019-01-14 DIAGNOSIS — M109 Gout, unspecified: Secondary | ICD-10-CM

## 2019-01-14 DIAGNOSIS — E1122 Type 2 diabetes mellitus with diabetic chronic kidney disease: Secondary | ICD-10-CM

## 2019-01-14 DIAGNOSIS — D631 Anemia in chronic kidney disease: Secondary | ICD-10-CM

## 2019-01-14 DIAGNOSIS — E7800 Pure hypercholesterolemia, unspecified: Secondary | ICD-10-CM | POA: Insufficient documentation

## 2019-01-14 DIAGNOSIS — Z885 Allergy status to narcotic agent status: Secondary | ICD-10-CM

## 2019-01-14 DIAGNOSIS — E039 Hypothyroidism, unspecified: Secondary | ICD-10-CM

## 2019-01-14 DIAGNOSIS — K219 Gastro-esophageal reflux disease without esophagitis: Secondary | ICD-10-CM

## 2019-01-14 DIAGNOSIS — Z833 Family history of diabetes mellitus: Secondary | ICD-10-CM | POA: Insufficient documentation

## 2019-01-14 HISTORY — PX: CYSTOSCOPY W/ RETROGRADES: SHX1426

## 2019-01-14 HISTORY — PX: URETEROSCOPY WITH HOLMIUM LASER LITHOTRIPSY: SHX6645

## 2019-01-14 LAB — GLUCOSE, CAPILLARY
Glucose-Capillary: 110 mg/dL — ABNORMAL HIGH (ref 70–99)
Glucose-Capillary: 150 mg/dL — ABNORMAL HIGH (ref 70–99)

## 2019-01-14 SURGERY — CYSTOSCOPY, WITH RETROGRADE PYELOGRAM
Anesthesia: General | Site: Ureter | Laterality: Right

## 2019-01-14 MED ORDER — FENTANYL CITRATE (PF) 100 MCG/2ML IJ SOLN
INTRAMUSCULAR | Status: AC
  Filled 2019-01-14: qty 2

## 2019-01-14 MED ORDER — LACTATED RINGERS IV SOLN
INTRAVENOUS | Status: DC
  Administered 2019-01-14: 07:00:00 via INTRAVENOUS

## 2019-01-14 MED ORDER — ONDANSETRON HCL 4 MG/2ML IJ SOLN
INTRAMUSCULAR | Status: AC
  Filled 2019-01-14: qty 2

## 2019-01-14 MED ORDER — PROMETHAZINE HCL 25 MG/ML IJ SOLN: 6.2500 mg | INTRAMUSCULAR | Status: DC | PRN

## 2019-01-14 MED ORDER — POTASSIUM CHLORIDE CRYS ER 10 MEQ PO TBCR
10.0000 meq | EXTENDED_RELEASE_TABLET | Freq: Three times a day (TID) | ORAL | Status: DC
  Administered 2019-01-14 – 2019-01-15 (×2): 10 meq via ORAL
  Filled 2019-01-14 (×5): qty 1

## 2019-01-14 MED ORDER — HEPARIN SODIUM (PORCINE) 5000 UNIT/ML IJ SOLN
5000.0000 [IU] | Freq: Three times a day (TID) | INTRAMUSCULAR | Status: DC
  Administered 2019-01-14 – 2019-01-15 (×2): 5000 [IU] via SUBCUTANEOUS
  Filled 2019-01-14 (×2): qty 1

## 2019-01-14 MED ORDER — BELLADONNA ALKALOIDS-OPIUM 16.2-60 MG RE SUPP: 1.0000 | Freq: Three times a day (TID) | RECTAL | Status: DC | PRN

## 2019-01-14 MED ORDER — FENTANYL CITRATE (PF) 100 MCG/2ML IJ SOLN
INTRAMUSCULAR | Status: DC | PRN
  Administered 2019-01-14 (×5): 50 ug via INTRAVENOUS

## 2019-01-14 MED ORDER — PHENYLEPHRINE 40 MCG/ML (10ML) SYRINGE FOR IV PUSH (FOR BLOOD PRESSURE SUPPORT)
PREFILLED_SYRINGE | INTRAVENOUS | Status: DC | PRN
  Administered 2019-01-14: 80 ug via INTRAVENOUS

## 2019-01-14 MED ORDER — BELLADONNA ALKALOIDS-OPIUM 16.2-30 MG RE SUPP
RECTAL | Status: AC
  Filled 2019-01-14: qty 1

## 2019-01-14 MED ORDER — ONDANSETRON HCL 4 MG/2ML IJ SOLN
4.0000 mg | Freq: Once | INTRAMUSCULAR | Status: AC
  Administered 2019-01-14: 4 mg via INTRAVENOUS

## 2019-01-14 MED ORDER — FERROUS SULFATE 325 (65 FE) MG PO TABS
325.0000 mg | ORAL_TABLET | Freq: Two times a day (BID) | ORAL | Status: DC
  Administered 2019-01-15: 325 mg via ORAL
  Filled 2019-01-14: qty 1

## 2019-01-14 MED ORDER — LIDOCAINE 2% (20 MG/ML) 5 ML SYRINGE
INTRAMUSCULAR | Status: DC | PRN
  Administered 2019-01-14: 100 mg via INTRAVENOUS

## 2019-01-14 MED ORDER — FESOTERODINE FUMARATE ER 4 MG PO TB24
4.0000 mg | ORAL_TABLET | Freq: Every day | ORAL | Status: DC
  Administered 2019-01-14: 4 mg via ORAL
  Filled 2019-01-14: qty 1

## 2019-01-14 MED ORDER — LEVOTHYROXINE SODIUM 75 MCG PO TABS
75.0000 ug | ORAL_TABLET | Freq: Every day | ORAL | Status: DC
  Administered 2019-01-15: 75 ug via ORAL
  Filled 2019-01-14: qty 1

## 2019-01-14 MED ORDER — CEFAZOLIN SODIUM-DEXTROSE 2-4 GM/100ML-% IV SOLN
2.0000 g | INTRAVENOUS | Status: AC
  Administered 2019-01-14: 2 g via INTRAVENOUS
  Filled 2019-01-14: qty 100

## 2019-01-14 MED ORDER — BELLADONNA ALKALOIDS-OPIUM 16.2-60 MG RE SUPP
RECTAL | Status: DC | PRN
  Administered 2019-01-14: 1 via RECTAL

## 2019-01-14 MED ORDER — PHENAZOPYRIDINE HCL 200 MG PO TABS
Freq: Once | ORAL | Status: DC
  Filled 2019-01-14: qty 15

## 2019-01-14 MED ORDER — BIOTIN 5 MG PO CAPS: 5.0000 mg | ORAL_CAPSULE | Freq: Every day | ORAL | 0 refills | Status: DC

## 2019-01-14 MED ORDER — VITAMIN B-12 1000 MCG PO TABS
1000.0000 ug | ORAL_TABLET | Freq: Every day | ORAL | Status: DC
  Administered 2019-01-15: 1000 ug via ORAL
  Filled 2019-01-14: qty 1

## 2019-01-14 MED ORDER — MIRABEGRON ER 25 MG PO TB24
50.0000 mg | ORAL_TABLET | Freq: Every day | ORAL | Status: DC
  Administered 2019-01-14 – 2019-01-15 (×2): 50 mg via ORAL
  Filled 2019-01-14 (×2): qty 2

## 2019-01-14 MED ORDER — LABETALOL HCL 5 MG/ML IV SOLN
INTRAVENOUS | Status: AC
  Administered 2019-01-14: 5 mg via INTRAVENOUS
  Filled 2019-01-14: qty 4

## 2019-01-14 MED ORDER — ACETAMINOPHEN 10 MG/ML IV SOLN
1000.0000 mg | Freq: Four times a day (QID) | INTRAVENOUS | Status: DC
  Administered 2019-01-14 – 2019-01-15 (×3): 1000 mg via INTRAVENOUS
  Filled 2019-01-14 (×4): qty 100

## 2019-01-14 MED ORDER — ACETAMINOPHEN 10 MG/ML IV SOLN: 1000.0000 mg | Freq: Four times a day (QID) | INTRAVENOUS | Status: DC

## 2019-01-14 MED ORDER — FAMOTIDINE 20 MG PO TABS
10.0000 mg | ORAL_TABLET | Freq: Every day | ORAL | Status: DC
  Administered 2019-01-15: 10 mg via ORAL
  Filled 2019-01-14: qty 1

## 2019-01-14 MED ORDER — MEPERIDINE HCL 50 MG/ML IJ SOLN: 6.2500 mg | INTRAMUSCULAR | Status: DC | PRN

## 2019-01-14 MED ORDER — FUROSEMIDE 40 MG PO TABS
160.0000 mg | ORAL_TABLET | Freq: Every day | ORAL | Status: DC
  Administered 2019-01-15: 160 mg via ORAL
  Filled 2019-01-14: qty 4

## 2019-01-14 MED ORDER — SODIUM CHLORIDE 0.9 % IV SOLN
INTRAVENOUS | Status: DC | PRN
  Administered 2019-01-14: 50 mL

## 2019-01-14 MED ORDER — FENTANYL CITRATE (PF) 100 MCG/2ML IJ SOLN: 25.0000 ug | INTRAMUSCULAR | Status: DC | PRN

## 2019-01-14 MED ORDER — LABETALOL HCL 5 MG/ML IV SOLN
5.0000 mg | INTRAVENOUS | Status: DC | PRN
  Administered 2019-01-14: 5 mg via INTRAVENOUS

## 2019-01-14 MED ORDER — OXYCODONE HCL 5 MG PO CAPS: 5.0000 mg | ORAL_CAPSULE | ORAL | 0 refills | Status: DC | PRN

## 2019-01-14 MED ORDER — LORATADINE 10 MG PO TABS
10.0000 mg | ORAL_TABLET | Freq: Every day | ORAL | Status: DC
  Administered 2019-01-15: 10 mg via ORAL
  Filled 2019-01-14: qty 1

## 2019-01-14 MED ORDER — ADULT MULTIVITAMIN W/MINERALS CH
ORAL_TABLET | Freq: Every day | ORAL | Status: DC
  Administered 2019-01-15: 1 via ORAL
  Filled 2019-01-14: qty 1

## 2019-01-14 MED ORDER — OXYCODONE HCL 5 MG PO TABS
5.0000 mg | ORAL_TABLET | Freq: Once | ORAL | Status: AC | PRN
  Administered 2019-01-14: 10 mg via ORAL
  Filled 2019-01-14: qty 2

## 2019-01-14 MED ORDER — HYDRALAZINE HCL 20 MG/ML IJ SOLN
5.0000 mg | Freq: Four times a day (QID) | INTRAMUSCULAR | Status: DC | PRN
  Administered 2019-01-14: 5 mg via INTRAVENOUS
  Filled 2019-01-14: qty 1

## 2019-01-14 MED ORDER — INSULIN GLARGINE 100 UNIT/ML ~~LOC~~ SOLN
20.0000 [IU] | Freq: Every day | SUBCUTANEOUS | Status: DC
  Administered 2019-01-15: 20 [IU] via SUBCUTANEOUS
  Filled 2019-01-14: qty 0.2

## 2019-01-14 MED ORDER — FOLIC ACID 1 MG PO TABS
1.0000 mg | ORAL_TABLET | Freq: Every day | ORAL | Status: DC
  Administered 2019-01-15: 1 mg via ORAL
  Filled 2019-01-14: qty 1

## 2019-01-14 MED ORDER — SODIUM CHLORIDE 0.9 % IR SOLN
Status: DC | PRN
  Administered 2019-01-14: 1000 mL via INTRAVESICAL
  Administered 2019-01-14: 3000 mL via INTRAVESICAL
  Administered 2019-01-14 (×2): 1000 mL via INTRAVESICAL

## 2019-01-14 MED ORDER — ACETAMINOPHEN 10 MG/ML IV SOLN
1000.0000 mg | Freq: Once | INTRAVENOUS | Status: AC | PRN
  Administered 2019-01-14: 1000 mg via INTRAVENOUS

## 2019-01-14 MED ORDER — LIDOCAINE 2% (20 MG/ML) 5 ML SYRINGE
INTRAMUSCULAR | Status: AC
  Filled 2019-01-14: qty 5

## 2019-01-14 MED ORDER — ALLOPURINOL 100 MG PO TABS
200.0000 mg | ORAL_TABLET | Freq: Every day | ORAL | Status: DC
  Administered 2019-01-14 – 2019-01-15 (×2): 200 mg via ORAL
  Filled 2019-01-14 (×2): qty 2

## 2019-01-14 MED ORDER — STERILE WATER FOR IRRIGATION IR SOLN
Status: DC | PRN
  Administered 2019-01-14: 3000 mL

## 2019-01-14 MED ORDER — FLUTICASONE PROPIONATE 50 MCG/ACT NA SUSP: 2.0000 | Freq: Every day | NASAL | Status: DC | PRN

## 2019-01-14 MED ORDER — ONDANSETRON HCL 4 MG/2ML IJ SOLN
INTRAMUSCULAR | Status: DC | PRN
  Administered 2019-01-14: 4 mg via INTRAVENOUS

## 2019-01-14 MED ORDER — ONDANSETRON HCL 4 MG PO TABS: 4.0000 mg | ORAL_TABLET | Freq: Three times a day (TID) | ORAL | Status: DC | PRN

## 2019-01-14 MED ORDER — PHENYLEPHRINE 40 MCG/ML (10ML) SYRINGE FOR IV PUSH (FOR BLOOD PRESSURE SUPPORT)
PREFILLED_SYRINGE | INTRAVENOUS | Status: AC
  Filled 2019-01-14: qty 10

## 2019-01-14 MED ORDER — ACETAMINOPHEN 10 MG/ML IV SOLN
INTRAVENOUS | Status: AC
  Filled 2019-01-14: qty 100

## 2019-01-14 MED ORDER — PROPOFOL 10 MG/ML IV BOLUS
INTRAVENOUS | Status: DC | PRN
  Administered 2019-01-14: 140 mg via INTRAVENOUS

## 2019-01-14 MED ORDER — TRAMADOL HCL 50 MG PO TABS
50.0000 mg | ORAL_TABLET | Freq: Two times a day (BID) | ORAL | Status: DC | PRN
  Administered 2019-01-14 – 2019-01-15 (×2): 100 mg via ORAL
  Filled 2019-01-14 (×2): qty 2

## 2019-01-14 MED ORDER — LIDOCAINE 5 % EX OINT: 1.0000 "application " | TOPICAL_OINTMENT | CUTANEOUS | Status: DC | PRN

## 2019-01-14 MED ORDER — PROPOFOL 10 MG/ML IV BOLUS
INTRAVENOUS | Status: AC
  Filled 2019-01-14: qty 20

## 2019-01-14 SURGICAL SUPPLY — 22 items
BAG URO CATCHER STRL LF (MISCELLANEOUS) ×3 IMPLANT
BASKET ZERO TIP NITINOL 2.4FR (BASKET) IMPLANT
CATH ROBINSON RED A/P 16FR (CATHETERS) ×3 IMPLANT
CATH URET 5FR 28IN OPEN ENDED (CATHETERS) ×3 IMPLANT
CLOTH BEACON ORANGE TIMEOUT ST (SAFETY) ×3 IMPLANT
COVER WAND RF STERILE (DRAPES) IMPLANT
EXTRACTOR STONE 1.7FRX115CM (UROLOGICAL SUPPLIES) ×3 IMPLANT
FIBER LASER TRAC TIP (UROLOGICAL SUPPLIES) ×3 IMPLANT
GLOVE BIOGEL M STRL SZ7.5 (GLOVE) ×3 IMPLANT
GOWN STRL REUS W/TWL XL LVL3 (GOWN DISPOSABLE) ×3 IMPLANT
GUIDEWIRE ANG ZIPWIRE 038X150 (WIRE) IMPLANT
GUIDEWIRE STR DUAL SENSOR (WIRE) ×6 IMPLANT
MANIFOLD NEPTUNE II (INSTRUMENTS) ×3 IMPLANT
PACK CYSTO (CUSTOM PROCEDURE TRAY) ×3 IMPLANT
SHEATH URETERAL 12FRX28CM (UROLOGICAL SUPPLIES) IMPLANT
SHEATH URETERAL 12FRX35CM (MISCELLANEOUS) IMPLANT
STENT URET 6FRX24 CONTOUR (STENTS) ×3 IMPLANT
SYR 10ML ECCENTRIC (SYRINGE) ×6 IMPLANT
SYRINGE IRR TOOMEY STRL 70CC (SYRINGE) ×3 IMPLANT
TUBING CONNECTING 10 (TUBING) ×3 IMPLANT
TUBING UROLOGY SET (TUBING) ×3 IMPLANT
WIRE COONS/BENSON .038X145CM (WIRE) IMPLANT

## 2019-01-14 NOTE — Op Note (Signed)
Preoperative diagnosis:  1. Hematuria 2. Chronic pelvic pain 3. Right non-obstructing kidney stone   Postoperative diagnosis:  1. same   Procedure: 1. Cystoscopy, bilateral retrograde pyelogram w/ interpretation 2. Right diagnostic ureteroscopy, laser lithotripsy,  Stone extraction 3. Right ureteral stent placement 4. Bladder biopsy with fulguration 5. Hydrodistention 6. Post-op Instillation of intravesical pyridium/marcaine  Surgeon: Ardis Hughs, MD  Anesthesia: General  Complications: None  Intraoperative findings:  #1 left retrograde pyelogram was normal - no hydronephrosis/filling defects #2 right retrograde pyelogram -demonstrated moderate hydroureteronephrosis with a tortuous proximal ureter and what appeared to be a right UPJ obstruction.  The calyces were dilated and blunted. #3: Diagnostic ureteroscopy demonstrated no obvious abnormality of the ureter.  There was partial disruption at the UVJ, which is unclear of whether this was caused by the ureteroscope or had been present prior.  There were no tumors or lesions in this area.  I did send a urine cytology. #4: There was an 8 mm stone in the patient's mid pole calyx which I fragmented into numerous small pieces and then removed to the bigger pieces which we will send for specimen. #5: With hydrodistention the bladder was noted to have a capacity of approximately 300 cc. #6: There was some webbing type mucosa within the bladder that seem to be very superficial and not adherent, and it involved a lot of the dome as well as the posterior wall. 7.:  4 separate bladder biopsies were taken and these areas were fulgurated. #8: A Pyridium and Marcaine cocktail was instilled into the patient's bladder at the end of the case.  EBL: Minimal  Specimens: None  Indication: Erika Fry is a 73 y.o. patient with severe chronic pelvic pain and hematuria.  Her pain is been refractory to physical therapy.  She also had  hematuria and has had some worsening renal function.  After reviewing the management options for treatment, he elected to proceed with the above surgical procedure(s). We have discussed the potential benefits and risks of the procedure, side effects of the proposed treatment, the likelihood of the patient achieving the goals of the procedure, and any potential problems that might occur during the procedure or recuperation. Informed consent has been obtained.  Description of procedure:  The patient was taken to the operating room and general anesthesia was induced.  The patient was placed in the dorsal lithotomy position, prepped and draped in the usual sterile fashion, and preoperative antibiotics were administered. A preoperative time-out was performed.   21 French 30 degrees cystoscope was gently passed to the patient's urethra and the bladder under visual guidance.  Cystoscopy was then performed with the above findings.  I then performed a retrograde pyelogram on the patient's left side with 10 cc wound for contrast using a 5 [place] open-ended ureteral catheter with the above findings.  I then used the same catheter and instilled 10 cc of Omnipaque contrast into the patient's right ureter with the above findings.  Given the abnormality I opted to advance a wire up the ureter which I then advanced the 5 [place] open-ended catheter over the wire.  I could not get the wire across the UVJ.  There is a tight stenotic area there.  I then performed ureteroscopy using the semirigid scope and got up to the UVJ but could not make the tortuous turn to get past the UVJ.  At this point I passed a second wire and then passed the flexible scope over the wire.  Ultimately was  able to get across the UPJ area and was able to perform pyeloscopy.  There was no significant abnormalities in the right renal pelvis although there was a large stone in the mid calyx.  At this time I opted to fragment the stone into smaller pieces and  then 2 of these pieces were grasped and removed.  On the way out I did notice the disruption of the UVJ, partially.  I was able to ensure that the wire was beyond this area and there was no significant bleeding.  Prior to removing the stone I did send a right pelvic washing for urine cytology.  Once the stone had been removed with the basket left the wire place and exchanged the ureteroscope for the cystoscope.  I then performed bladder biopsies at the right lateral wall, posterior wall, left lateral wall, and midline trigonal region.  Using the [place] I cauterized these biopsy areas.  Once the bleeding was controlled I then performed a hydrodistention.  I elevated the fluid to 80 cm above the patient's abdomen and allow the saline to passively infuse into the patient's bladder.  Was some infusion stopped we paused for 90 seconds and then emptied the bladder.  Approximately 300 cc of normal saline was noted.  There were no other lesions or any other clear abnormalities except for the webbing that was described in the findings.  I then advanced a 5 [place] open-ended catheter over the wire again and up into the right renal pelvis injecting some contrast in this area to help guide stent placement.  I exchanged the sensor wire for the 5 [place] open-ended catheter and advanced a 24 cm x 6 French double-J ureteral stent over the wire and under fluoroscopic guidance advanced it up into the right renal pelvis.  Once the stent was noted to be well positioned in the pelvis I advanced it up to the urethral meatus prior to removing the wire.  I then used the beak of the scope to push the distal end of the stent into the patient's bladder.  I then ensured the patient's bladder was empty and using a 16 [place] rubber catheter instilled a lidocaine and Marcaine mixed cocktail into the patient's bladder.  A B&O was placed in the patient's rectum.  She was subsequently extubated return the PACU in stable condition.  Ardis Hughs, M.D.

## 2019-01-14 NOTE — Progress Notes (Signed)
Patient came to [place] at 1549. Alert and respond to pain and name. Stomach pain complained. Very drowsiness. Voided when arrive in patient's room. Vital signs was taken. Room is set up. Bed alarm is set up.

## 2019-01-14 NOTE — H&P (Signed)
12/01/18: Patient with history of voiding dysfunction and pelvic pain. I saw the patient about a week ago and her urine had nitrites and bacteria consistent with an infection. This was also consistent with her symptoms. We started her on my monurol given her resistance patterns from previous cultures. However, her urine culture returned negative.   Over the course of the patient's last week she has had significant worsening of her pain. Her pain is worse with urinating and she has associated nausea. She is quite miserable. She endorses weight urinary frequency as well. She has not had any fevers or chills.   The patient had a CT scan in June 2019 which demonstrated a large stone on the right side in her kidney as well as a smaller stone in the left kidney.     01/05/19: Patient with above noted history. Her past 2 cultures have been negative. At prior OV, she was started on Myrbetriq and given intravaginal valium. PT was also recommended. She has been doing this. However, despite these therapies she continues to have progressive pelvic pain. She is scheduled for cysto, HOD, and bilateral retrograde pyelogram with Dr. Louis Meckel on 2/13. She is also using Lidociane per vagina PRN without much benefit. Today she presents with complaints of persistent dysuria. She states that she has severe discomfort associated with voiding and after voiding. She also complains of increased urinary frequency. She is having frequency of about every 15-30 minutes. She has nocturia several times per night and is unable to rest. She denies gross hematuria, fevers, or chills. She noted generalized lower back pain, but denies unilateral flank pain. She has tried [employer] in the past without benefit. 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     ALLERGIES: Baclofen codeine    MEDICATIONS: Doxycycline Hyclate  Myrbetriq 50 mg tablet, extended release 24 hr 1 tablet PO Daily  Simvastatin  Valium 10 mg tablet 1 tablet Per Vagina Q HS   Lidocaine 4 % cream 1 bead Per Vagina PRN     GU PSH: None     PSH Notes: both knees, pace maker    NON-GU PSH: Tonsillectomy    GU PMH: Pelvic/perineal pain - 12/08/2018 Microscopic hematuria - 12/01/2018 Dysuria - 11/23/2018 Kidney Failure Unspec    NON-GU PMH: Muscle weakness (generalized) - 12/08/2018 Other muscle spasm - 12/08/2018 Arthritis Cardiac murmur, unspecified Diabetes Type 2 GERD Gout Hypercholesterolemia Hypertension Hypothyroidism    FAMILY HISTORY: 3 Son's - Runs in Family Diabetes - Father father deceased - Runs in Family Kidney Stones - Son, Brother mother deceased at age 78 - 75 in Family Sarcoma - Mother   SOCIAL HISTORY: Marital Status: Widowed Preferred Language: English; Ethnicity: Not Hispanic Or Latino; Race: White Current Smoking Status: Patient has never smoked.   Tobacco Use Assessment Completed: Used Tobacco in last 30 days? Does not use smokeless tobacco. Patient's occupation is/was Retired.    REVIEW OF SYSTEMS:    GU Review Female:   Patient reports frequent urination, burning /pain with urination, and get up at night to urinate. Patient denies hard to postpone urination, leakage of urine, stream starts and stops, trouble starting your stream, have to strain to urinate, and being pregnant.  Gastrointestinal (Upper):   Patient denies nausea, vomiting, and indigestion/ heartburn.  Gastrointestinal (Lower):   Patient denies diarrhea and constipation.  Constitutional:   Patient denies fever, night sweats, weight loss, and fatigue.  Skin:   Patient denies skin rash/ lesion and itching.  Eyes:   Patient denies  blurred vision and double vision.  Ears/ Nose/ Throat:   Patient denies sore throat and sinus problems.  Hematologic/Lymphatic:   Patient denies swollen glands and easy bruising.  Cardiovascular:   Patient denies leg swelling and chest pains.  Respiratory:   Patient denies cough and shortness of breath.  Endocrine:   Patient  denies excessive thirst.  Musculoskeletal:   Patient denies joint pain and back pain.  Neurological:   Patient denies headaches and dizziness.  Psychologic:   Patient denies depression and anxiety.   VITAL SIGNS:      01/05/2019 12:47 PM  Weight 175 lb / 79.38 kg  BP 115/70 mmHg  Pulse 87 /min  Temperature 98.7 F / 37.0 C   MULTI-SYSTEM PHYSICAL EXAMINATION:    Constitutional: Well-nourished. No physical deformities. Normally developed. Good grooming.  Respiratory: No labored breathing, no use of accessory muscles.   Cardiovascular: Normal temperature, normal extremity pulses, no swelling, no varicosities.  Neurologic / Psychiatric: Oriented to time, oriented to place, oriented to person. No depression, no anxiety, no agitation.  Gastrointestinal: No mass, no tenderness, no rigidity, non obese abdomen. Mild suprapubic discomfort. No CVAT.   Musculoskeletal: Normal gait and station of head and neck.     PAST DATA REVIEWED:  Source Of History:  Patient  Records Review:   Previous Patient Records  Urine Test Review:   Urinalysis, Urine Culture  Urodynamics Review:   Review Bladder Scan   PROCEDURES:         PVR Ultrasound - 38250  Scanned Volume: 63 cc         Urinalysis w/Scope Dipstick Dipstick Cont'd Micro  Color: Yellow Bilirubin: Neg mg/dL WBC/hpf: >60/hpf  Appearance: Turbid Ketones: Neg mg/dL RBC/hpf: 3 - 10/hpf  Specific Gravity: 1.025 Blood: 3+ ery/uL Bacteria: Rare (0-9/hpf)  pH: 5.5 Protein: 3+ mg/dL Cystals: NS (Not Seen)  Glucose: Neg mg/dL Urobilinogen: 0.2 mg/dL Casts: NS (Not Seen)    Nitrites: Positive Trichomonas: Not Present    Leukocyte Esterase: 3+ leu/uL Mucous: Not Present      Epithelial Cells: NS (Not Seen)      Yeast: NS (Not Seen)      Sperm: Not Present    Notes: MICROSCOPIC PERFORMED ON UNCONCENTRATED URINE    ASSESSMENT:      ICD-10 Details  1 GU:   Dysuria - R30.0   2   Pelvic/perineal pain - R10.2   3   Microscopic hematuria - R31.1     PLAN:            Medications New Meds: Toviaz 4 mg tablet, extended release 24 hr 1 tablet PO Daily   #14  0 Refill(s)  Tramadol Hcl 50 mg tablet 1 tablet PO Q 12 H PRN   #14  0 Refill(s)            Orders Labs Urine Culture          Document Letter(s):  Created for Patient: Clinical Summary         Notes:   Urinalysis today continues to show pyuria, microscopic hematuria, and bacteriuria. I will repeat urine culture; however, my suspicion for infectious process is low given that today's specimen is similar to past specimens. I will be in touch with culture results. Her symptoms today are progressive and quite bothersome to her. Unfortunately, I am not sure what other options I have for her today. We did discuss the possibility of adding anti-cholinergic for combination therapy with Myrbetriq. She would like to trial  this. Samples of 4 mg Toviaz were given. We reviewed indications and potential side effects in detail. Given persistent pain, I am going to send Rx for Tramadol today as well. Given her renal function, we discussed use every 12 hours on PRN basis. Indications and potential side effects also reviewed. IC diet sheet provided today and I encouraged that she follow this. Behavioral modifications reviewed. She will follow up, as planned, for scheduled procedure with Dr. Louis Meckel. Return precuations were reviewed in the interim. Case and above recommendations discussed with Dr. Louis Meckel today.

## 2019-01-14 NOTE — Discharge Instructions (Signed)
DISCHARGE INSTRUCTIONS FOR KIDNEY STONE/URETERAL STENT   MEDICATIONS:  1.  Resume all your other meds from home - except do not take any extra narcotic pain meds that you may have at home.  2.  Oxycodone is for break through pain.  ACTIVITY:  1. No strenuous activity x 1week  2. No driving while on narcotic pain medications  3. Drink plenty of water  4. Continue to walk at home - you can still get blood clots when you are at home, so keep active, but don't over do it.  5. May return to work/school tomorrow or when you feel ready   BATHING:  1. You can shower and we recommend daily showers  2. You have a string coming from your urethra: The stent string is attached to your ureteral stent. Do not pull on this.   SIGNS/SYMPTOMS TO CALL:  Please call us if you have a fever greater than 101.5, uncontrolled nausea/vomiting, uncontrolled pain, dizziness, unable to urinate, bloody urine, chest pain, shortness of breath, leg swelling, leg pain, redness around wound, drainage from wound, or any other concerns or questions.   You can reach [place] at [phone].   FOLLOW-UP:  1. You have an appointment in 2 weeks with a ultrasound of your kidneys prior.

## 2019-01-14 NOTE — Interval H&P Note (Signed)
History and Physical Interval Note:  01/14/2019 7:38 AM  Erika Fry  has presented today for surgery, with the diagnosis of PELVIC PAIN, HEMATURIA  The various methods of treatment have been discussed with the patient and family. After consideration of risks, benefits and other options for treatment, the patient has consented to  Procedure(s): CYSTOSCOPY WITH RETROGRADE PYELOGRAM POSSIBLE HYDRODISTENTION (Bilateral) as a surgical intervention .  The patient's history has been reviewed, patient examined, no change in status, stable for surgery.  I have reviewed the patient's chart and labs.  Questions were answered to the patient's satisfaction.     Ardis Hughs

## 2019-01-14 NOTE — Anesthesia Procedure Notes (Addendum)
Procedure Name: LMA Insertion Date/Time: 01/14/2019 8:23 AM Performed by: Maxwell Caul, CRNA Pre-anesthesia Checklist: Patient identified, Suction available, Emergency Drugs available and Patient being monitored Patient Re-evaluated:Patient Re-evaluated prior to induction Oxygen Delivery Method: Circle system utilized Preoxygenation: Pre-oxygenation with 100% oxygen Induction Type: IV induction LMA: LMA inserted LMA Size: 4.0 Number of attempts: 1 Placement Confirmation: positive ETCO2 and breath sounds checked- equal and bilateral Tube secured with: Tape Dental Injury: Teeth and Oropharynx as per pre-operative assessment

## 2019-01-14 NOTE — Progress Notes (Signed)
BP= 166/86. Patient has history of HTN. Alert and oriented x4. Hydralazine was given as MD ordered. Will recheck bp in 30 minutes

## 2019-01-14 NOTE — Transfer of Care (Signed)
Immediate Anesthesia Transfer of Care Note  Patient: Erika Fry  Procedure(s) Performed: CYSTOSCOPY WITH RETROGRADE PYELOGRAM BILATERAL HYDRODISTENTION (Bilateral Bladder) URETEROSCOPY WITH HOLMIUM LASER LITHOTRIPSY BLADDER BIOPSIES RIGHT STENT PLACEMENT (Right Ureter)  Patient Location: PACU  Anesthesia Type:General  Level of Consciousness: awake, alert  and oriented  Airway & Oxygen Therapy: Patient Spontanous Breathing and Patient connected to face mask oxygen  Post-op Assessment: Report given to RN and Post -op Vital signs reviewed and stable  Post vital signs: Reviewed and stable  Last Vitals:  Vitals Value Taken Time  BP 170/89 01/14/2019 10:00 AM  Temp 36.9 C 01/14/2019 10:00 AM  Pulse 82 01/14/2019 10:05 AM  Resp 17 01/14/2019 10:05 AM  SpO2 97 % 01/14/2019 10:05 AM  Vitals shown include unvalidated device data.  Last Pain:  Vitals:   01/14/19 0626  TempSrc:   PainSc: 0-No pain         Complications: No apparent anesthesia complications

## 2019-01-15 ENCOUNTER — Encounter (HOSPITAL_COMMUNITY): Payer: Self-pay | Admitting: Urology

## 2019-01-15 NOTE — Discharge Summary (Signed)
Date of admission: 01/14/2019  Date of discharge: 01/15/2019  Admission diagnosis: hematuria and pelvic pain  Discharge diagnosis: same  Secondary diagnoses:  Patient Active Problem List   Diagnosis Date Noted  . Gross hematuria 01/14/2019  . Fluid retention   . Generalized edema   . Hypokalemia   . Acute right-sided low back pain with right-sided sciatica   . Acute blood loss anemia   . Anemia of chronic disease   . Thrombocytopenia ([place])   . Diabetes mellitus ([place])   . Morbid obesity ([place])   . Benign essential HTN   . Hypoalbuminemia due to protein-calorie malnutrition ([place])   . Occult blood in stools   . Gastric polyp   . Lumbar discitis 05/17/2018  . Blood loss anemia 05/12/2018  . Hyperlipidemia associated with type 2 diabetes mellitus ([place]) 05/10/2018  . Essential hypertension 04/29/2018  . CKD (chronic kidney disease), stage IV ([place])   . Diet-controlled diabetes mellitus ([place])   . GERD (gastroesophageal reflux disease)   . Gout   . High cholesterol   . Hypothyroidism   . Iron deficiency anemia   . AV block, Mobitz 2 03/25/2018  . Mobitz type 2 second degree AV block 03/20/2018  . Aortic stenosis 03/20/2018  . Exertional dyspnea 03/19/2018    Procedures performed: Procedure(s): CYSTOSCOPY WITH RETROGRADE PYELOGRAM BILATERAL HYDRODISTENTION URETEROSCOPY WITH HOLMIUM LASER LITHOTRIPSY BLADDER BIOPSIES RIGHT STENT PLACEMENT  History and Physical: For full details, please see admission history and physical. Briefly, Erika Fry is a 73 y.o. year old patient with chronic pelvic pain and hematuria.   Hospital Course: Patient tolerated the procedure well.  She was then transferred to the floor after an uneventful PACU stay.  She was hard to arouse and somnolent so she was admitted for observation.  Her hospital course was uncomplicated.  On POD#1 she had met discharge criteria: was eating a regular diet, was up and ambulating independently,  pain was well  controlled, was voiding without a catheter, and was ready to for discharge.   Laboratory values:  Recent Labs    01/13/19 1439  WBC 7.9  HGB 8.6*  HCT 27.2*   Recent Labs    01/13/19 1439  NA 136  K 3.5  CL 100  CO2 23  GLUCOSE 114*  BUN 94*  CREATININE 5.34*  CALCIUM 10.1   No results for input(s): LABPT, INR in the last 72 hours. No results for input(s): LABURIN in the last 72 hours. Results for orders placed or performed during the hospital encounter of 05/22/18  Urine Culture     Status: None   Collection Time: 05/29/18  3:20 AM  Result Value Ref Range Status   Specimen Description URINE, RANDOM  Final   Special Requests NONE  Final   Culture   Final    NO GROWTH Performed at [facility], [address], [place], [place] [address]    Report Status 05/31/2018 FINAL  Final  Culture, blood (routine x 2)     Status: None   Collection Time: 05/30/18  6:43 PM  Result Value Ref Range Status   Specimen Description BLOOD RIGHT ARM  Final   Special Requests   Final    BOTTLES DRAWN AEROBIC AND ANAEROBIC Blood Culture results may not be optimal due to an inadequate volume of blood received in culture bottles   Culture   Final    NO GROWTH 5 DAYS Performed at [facility], [place] [address] [place], [place]  [address]    Report Status 06/04/2018 FINAL  Final  Culture, blood (routine x 2)     Status: None   Collection Time: 05/30/18  6:53 PM  Result Value Ref Range Status   Specimen Description BLOOD RIGHT WRIST  Final   Special Requests   Final    BOTTLES DRAWN AEROBIC AND ANAEROBIC Blood Culture results may not be optimal due to an inadequate volume of blood received in culture bottles   Culture   Final    NO GROWTH 5 DAYS Performed at [facility], [place] [address], [place], [place] [address]    Report Status 06/04/2018 FINAL  Final    Disposition: Home  Discharge instruction: The patient was instructed to be ambulatory but told to refrain from  heavy lifting, strenuous activity, or driving.   Discharge medications:  Allergies as of 01/15/2019      Reactions   Baclofen Other (See Comments)   somnolence   Codeine Other (See Comments)   Increases Pain and couldn't sleep      Medication List    STOP taking these medications   AZO-STANDARD PO   traMADol 50 MG tablet Commonly known as:  ULTRAM     TAKE these medications   allopurinol 100 MG tablet Commonly known as:  ZYLOPRIM Take 2 tablets (200 mg total) by mouth daily. What changed:  how much to take   Biotin 5 MG Caps Take 1 capsule (5 mg total) by mouth daily. What changed:  how much to take   cetirizine 10 MG tablet Commonly known as:  ZYRTEC Take 10 mg by mouth at bedtime.   diazepam 10 MG tablet Commonly known as:  VALIUM 10 mg See admin instructions. Insert 10 mg vaginally at bedtime   famotidine 10 MG tablet Commonly known as:  PEPCID Take 10 mg by mouth daily.   ferrous sulfate 325 (65 FE) MG tablet Take 1 tablet (325 mg total) by mouth 2 (two) times daily with a meal.   fluticasone 50 MCG/ACT nasal spray Commonly known as:  FLONASE Place 2 sprays into both nostrils daily as needed for allergies or rhinitis.   folic acid 1 MG tablet Commonly known as:  FOLVITE Take 1 tablet (1 mg total) by mouth daily.   furosemide 80 MG tablet Commonly known as:  LASIX Take 1 tablet (80 mg total) by mouth 2 (two) times daily. What changed:    how much to take  when to take this   Insulin Glargine 100 UNIT/ML Solostar Pen Commonly known as:  LANTUS Inject 20 Units into the skin daily.   Insulin Pen Needle 31G X 6 MM Misc 1 application by Does not apply route daily.   levothyroxine 75 MCG tablet Commonly known as:  SYNTHROID, LEVOTHROID Take 75 mcg by mouth daily before breakfast.   Lidocaine 5 % Crea Apply 1 application topically as needed (for pain).   MULTI-VITAMIN PO Take 1 tablet by mouth daily.   MYRBETRIQ 50 MG Tb24 tablet Generic  drug:  mirabegron ER Take 50 mg by mouth daily.   oxycodone 5 MG capsule Commonly known as:  OXY-IR Take 1-2 capsules (5-10 mg total) by mouth every 4 (four) hours as needed. What changed:  how much to take   potassium chloride 10 MEQ tablet Commonly known as:  K-DUR Take 1 tablet (10 mEq total) by mouth 3 (three) times daily.   simvastatin 40 MG tablet Commonly known as:  ZOCOR Take 0.5 tablets (20 mg total)  by mouth at bedtime. What changed:  how much to take   TOVIAZ 4 MG Tb24 tablet Generic drug:  fesoterodine Take 4 mg by mouth at bedtime.   vitamin B-12 1000 MCG tablet Commonly known as:  CYANOCOBALAMIN Take 1 tablet (1,000 mcg total) by mouth daily.   VITAMIN C PO Take 1 tablet by mouth 2 (two) times daily.       Followup:  Follow-up Information    Jed Limerick, NP On 01/27/2019.   Specialty:  Urology Why:  1:45 Contact information: [place] [address] [address] [phone]

## 2019-01-15 NOTE — Progress Notes (Signed)
Patient has discharged to home on 01/15/2019. Discharge instruction including medication and appointment was given to patient. No question at this time.

## 2019-01-16 ENCOUNTER — Other Ambulatory Visit: Payer: Self-pay

## 2019-01-16 ENCOUNTER — Inpatient Hospital Stay (HOSPITAL_COMMUNITY)
Admission: EM | Admit: 2019-01-16 | Discharge: 2019-01-29 | DRG: 659 | Disposition: A | Discharge status: Home Health | Payer: Medicare Other | Attending: Family Medicine | Admitting: Family Medicine

## 2019-01-16 ENCOUNTER — Emergency Department (HOSPITAL_COMMUNITY): Payer: Medicare Other

## 2019-01-16 ENCOUNTER — Encounter (HOSPITAL_COMMUNITY): Payer: Self-pay | Admitting: *Deleted

## 2019-01-16 DIAGNOSIS — Z888 Allergy status to other drugs, medicaments and biological substances status: Secondary | ICD-10-CM

## 2019-01-16 DIAGNOSIS — Z79890 Hormone replacement therapy (postmenopausal): Secondary | ICD-10-CM

## 2019-01-16 DIAGNOSIS — E669 Obesity, unspecified: Secondary | ICD-10-CM | POA: Diagnosis present

## 2019-01-16 DIAGNOSIS — I441 Atrioventricular block, second degree: Secondary | ICD-10-CM | POA: Diagnosis present

## 2019-01-16 DIAGNOSIS — L89321 Pressure ulcer of left buttock, stage 1: Secondary | ICD-10-CM | POA: Clinically undetermined

## 2019-01-16 DIAGNOSIS — Z885 Allergy status to narcotic agent status: Secondary | ICD-10-CM

## 2019-01-16 DIAGNOSIS — Z8249 Family history of ischemic heart disease and other diseases of the circulatory system: Secondary | ICD-10-CM

## 2019-01-16 DIAGNOSIS — R102 Pelvic and perineal pain: Secondary | ICD-10-CM | POA: Diagnosis present

## 2019-01-16 DIAGNOSIS — Z950 Presence of cardiac pacemaker: Secondary | ICD-10-CM

## 2019-01-16 DIAGNOSIS — K8020 Calculus of gallbladder without cholecystitis without obstruction: Secondary | ICD-10-CM | POA: Diagnosis present

## 2019-01-16 DIAGNOSIS — X58XXXA Exposure to other specified factors, initial encounter: Secondary | ICD-10-CM | POA: Diagnosis present

## 2019-01-16 DIAGNOSIS — M1712 Unilateral primary osteoarthritis, left knee: Secondary | ICD-10-CM | POA: Diagnosis present

## 2019-01-16 DIAGNOSIS — S83242A Other tear of medial meniscus, current injury, left knee, initial encounter: Secondary | ICD-10-CM | POA: Diagnosis present

## 2019-01-16 DIAGNOSIS — Z833 Family history of diabetes mellitus: Secondary | ICD-10-CM

## 2019-01-16 DIAGNOSIS — N184 Chronic kidney disease, stage 4 (severe): Secondary | ICD-10-CM | POA: Diagnosis present

## 2019-01-16 DIAGNOSIS — E039 Hypothyroidism, unspecified: Secondary | ICD-10-CM | POA: Diagnosis present

## 2019-01-16 DIAGNOSIS — L89311 Pressure ulcer of right buttock, stage 1: Secondary | ICD-10-CM | POA: Clinically undetermined

## 2019-01-16 DIAGNOSIS — Z800 Family history of malignant neoplasm of digestive organs: Secondary | ICD-10-CM

## 2019-01-16 DIAGNOSIS — E7800 Pure hypercholesterolemia, unspecified: Secondary | ICD-10-CM | POA: Diagnosis present

## 2019-01-16 DIAGNOSIS — G9341 Metabolic encephalopathy: Secondary | ICD-10-CM | POA: Diagnosis present

## 2019-01-16 DIAGNOSIS — E860 Dehydration: Secondary | ICD-10-CM

## 2019-01-16 DIAGNOSIS — E1169 Type 2 diabetes mellitus with other specified complication: Secondary | ICD-10-CM | POA: Diagnosis present

## 2019-01-16 DIAGNOSIS — D631 Anemia in chronic kidney disease: Secondary | ICD-10-CM | POA: Diagnosis present

## 2019-01-16 DIAGNOSIS — M25562 Pain in left knee: Secondary | ICD-10-CM

## 2019-01-16 DIAGNOSIS — Z79891 Long term (current) use of opiate analgesic: Secondary | ICD-10-CM

## 2019-01-16 DIAGNOSIS — Z794 Long term (current) use of insulin: Secondary | ICD-10-CM

## 2019-01-16 DIAGNOSIS — K317 Polyp of stomach and duodenum: Secondary | ICD-10-CM | POA: Diagnosis present

## 2019-01-16 DIAGNOSIS — N189 Chronic kidney disease, unspecified: Secondary | ICD-10-CM | POA: Diagnosis present

## 2019-01-16 DIAGNOSIS — N390 Urinary tract infection, site not specified: Secondary | ICD-10-CM | POA: Diagnosis present

## 2019-01-16 DIAGNOSIS — I350 Nonrheumatic aortic (valve) stenosis: Secondary | ICD-10-CM | POA: Diagnosis present

## 2019-01-16 DIAGNOSIS — Z79899 Other long term (current) drug therapy: Secondary | ICD-10-CM

## 2019-01-16 DIAGNOSIS — I10 Essential (primary) hypertension: Secondary | ICD-10-CM | POA: Diagnosis present

## 2019-01-16 DIAGNOSIS — D638 Anemia in other chronic diseases classified elsewhere: Secondary | ICD-10-CM | POA: Diagnosis present

## 2019-01-16 DIAGNOSIS — L8990 Pressure ulcer of unspecified site, unspecified stage: Secondary | ICD-10-CM

## 2019-01-16 DIAGNOSIS — N185 Chronic kidney disease, stage 5: Secondary | ICD-10-CM

## 2019-01-16 DIAGNOSIS — M4626 Osteomyelitis of vertebra, lumbar region: Secondary | ICD-10-CM | POA: Diagnosis present

## 2019-01-16 DIAGNOSIS — K219 Gastro-esophageal reflux disease without esophagitis: Secondary | ICD-10-CM | POA: Diagnosis present

## 2019-01-16 DIAGNOSIS — R0902 Hypoxemia: Secondary | ICD-10-CM | POA: Diagnosis not present

## 2019-01-16 DIAGNOSIS — M109 Gout, unspecified: Secondary | ICD-10-CM | POA: Diagnosis present

## 2019-01-16 DIAGNOSIS — E1122 Type 2 diabetes mellitus with diabetic chronic kidney disease: Secondary | ICD-10-CM | POA: Diagnosis present

## 2019-01-16 DIAGNOSIS — G8929 Other chronic pain: Secondary | ICD-10-CM | POA: Diagnosis present

## 2019-01-16 DIAGNOSIS — M25462 Effusion, left knee: Secondary | ICD-10-CM | POA: Diagnosis present

## 2019-01-16 DIAGNOSIS — I129 Hypertensive chronic kidney disease with stage 1 through stage 4 chronic kidney disease, or unspecified chronic kidney disease: Secondary | ICD-10-CM | POA: Diagnosis present

## 2019-01-16 DIAGNOSIS — N132 Hydronephrosis with renal and ureteral calculous obstruction: Secondary | ICD-10-CM | POA: Diagnosis present

## 2019-01-16 DIAGNOSIS — D500 Iron deficiency anemia secondary to blood loss (chronic): Secondary | ICD-10-CM | POA: Diagnosis present

## 2019-01-16 DIAGNOSIS — N179 Acute kidney failure, unspecified: Secondary | ICD-10-CM | POA: Diagnosis present

## 2019-01-16 DIAGNOSIS — R509 Fever, unspecified: Secondary | ICD-10-CM | POA: Diagnosis present

## 2019-01-16 DIAGNOSIS — E876 Hypokalemia: Secondary | ICD-10-CM | POA: Diagnosis present

## 2019-01-16 DIAGNOSIS — Z7982 Long term (current) use of aspirin: Secondary | ICD-10-CM

## 2019-01-16 DIAGNOSIS — Z87442 Personal history of urinary calculi: Secondary | ICD-10-CM

## 2019-01-16 DIAGNOSIS — Z6832 Body mass index (BMI) 32.0-32.9, adult: Secondary | ICD-10-CM

## 2019-01-16 DIAGNOSIS — R410 Disorientation, unspecified: Secondary | ICD-10-CM

## 2019-01-16 DIAGNOSIS — E872 Acidosis: Secondary | ICD-10-CM | POA: Diagnosis present

## 2019-01-16 DIAGNOSIS — M4646 Discitis, unspecified, lumbar region: Secondary | ICD-10-CM | POA: Diagnosis present

## 2019-01-16 DIAGNOSIS — K5900 Constipation, unspecified: Secondary | ICD-10-CM | POA: Diagnosis present

## 2019-01-16 LAB — CBC WITH DIFFERENTIAL/PLATELET
Abs Immature Granulocytes: 0.06 10*3/uL (ref 0.00–0.07)
Basophils Absolute: 0 10*3/uL (ref 0.0–0.1)
Basophils Relative: 0 %
Eosinophils Absolute: 0.2 10*3/uL (ref 0.0–0.5)
Eosinophils Relative: 2 %
HCT: 26.7 % — ABNORMAL LOW (ref 36.0–46.0)
Hemoglobin: 8.2 g/dL — ABNORMAL LOW (ref 12.0–15.0)
Immature Granulocytes: 1 %
Lymphocytes Relative: 7 %
Lymphs Abs: 0.7 10*3/uL (ref 0.7–4.0)
MCH: 30.3 pg (ref 26.0–34.0)
MCHC: 30.7 g/dL (ref 30.0–36.0)
MCV: 98.5 fL (ref 80.0–100.0)
Monocytes Absolute: 1.3 10*3/uL — ABNORMAL HIGH (ref 0.1–1.0)
Monocytes Relative: 12 %
Neutro Abs: 8.2 10*3/uL — ABNORMAL HIGH (ref 1.7–7.7)
Neutrophils Relative %: 78 %
Platelets: 173 10*3/uL (ref 150–400)
RBC: 2.71 MIL/uL — ABNORMAL LOW (ref 3.87–5.11)
RDW: 14.3 % (ref 11.5–15.5)
WBC: 10.6 10*3/uL — ABNORMAL HIGH (ref 4.0–10.5)
nRBC: 0 % (ref 0.0–0.2)

## 2019-01-16 LAB — URINALYSIS, ROUTINE W REFLEX MICROSCOPIC
Bacteria, UA: NONE SEEN
Bilirubin Urine: NEGATIVE
GLUCOSE, UA: 50 mg/dL — AB
Ketones, ur: NEGATIVE mg/dL
Nitrite: NEGATIVE
PH: 6 (ref 5.0–8.0)
Protein, ur: 100 mg/dL — AB
Specific Gravity, Urine: 1.012 (ref 1.005–1.030)
WBC, UA: >50 WBC/hpf — ABNORMAL HIGH (ref 0–5)

## 2019-01-16 LAB — CBG MONITORING, ED: Glucose-Capillary: 162 mg/dL — ABNORMAL HIGH (ref 70–99)

## 2019-01-16 LAB — LACTIC ACID, PLASMA: Lactic Acid, Venous: 1.1 mmol/L (ref 0.5–1.9)

## 2019-01-16 MED ORDER — SODIUM CHLORIDE 0.9 % IV SOLN
1.0000 g | Freq: Once | INTRAVENOUS | Status: AC
  Administered 2019-01-16: 1 g via INTRAVENOUS
  Filled 2019-01-16: qty 10

## 2019-01-16 MED ORDER — ACETAMINOPHEN 325 MG PO TABS
650.0000 mg | ORAL_TABLET | Freq: Once | ORAL | Status: AC
  Administered 2019-01-16: 650 mg via ORAL
  Filled 2019-01-16: qty 2

## 2019-01-16 MED ORDER — SODIUM CHLORIDE 0.9 % IV BOLUS
500.0000 mL | Freq: Once | INTRAVENOUS | Status: AC
  Administered 2019-01-16: 500 mL via INTRAVENOUS

## 2019-01-16 NOTE — ED Triage Notes (Signed)
Pt is presented by EMS, reportedly had a cystoscopy done yesterday part of evaluation for end stage CKD with possibility of dialysis initiation. Pt is c/o RUQ pain, weakness and malaise.

## 2019-01-16 NOTE — ED Provider Notes (Signed)
Erika Fry Provider Note   CSN: [id number] Arrival date & time: 01/16/19  2038     History   Chief Complaint No chief complaint on file.   HPI Erika Fry is a 73 y.o. female with a past medical history of CKD stage IV, hypertension, hypothyroid, anemia, diabetes, heart murmur, who presents today for evaluation of confusion.  History is primarily obtained from Erika Fry.  Erika Fry was admitted on Thursday for uroscopy and stent placement in the right kidney.  She has a history of dysuria that is constant.  Procedure was performed by Dr. Doreatha Martin.  She was admitted overnight and discharged Friday morning.  Erika Fry reports that since then she has been getting generally worse.  She has become confused which is significantly abnormal for her.  She has reportedly been having decreased p.o. intake.  She has been laying in a chair since her discharge.  She has not had pain medications since last night.   Erika Fry denies any pain anywhere except for "where I pee from."  This is not a new pain for her.   HPI  Past Medical History:  Diagnosis Date  . Arthritis    "knees, thumbs" (03/25/2018)  . CKD (chronic kidney disease), stage IV ([place])   . Diet-controlled diabetes mellitus ([place])   . GERD (gastroesophageal reflux disease)   . Gout    "on daily RX" (03/25/2018)  . Heart murmur   . High cholesterol   . Hypertension   . Hypothyroidism   . Iron deficiency anemia    "had to get an iron infusion"  . Migraine    "used to have them growing up" (03/25/2018)  . Presence of permanent cardiac pacemaker 03/25/2018    Erika Fry Active Problem List   Diagnosis Date Noted  . Gross hematuria 01/14/2019  . Fluid retention   . Generalized edema   . Hypokalemia   . Acute right-sided low back pain with right-sided sciatica   . Acute blood loss anemia   . Anemia of chronic disease   . Thrombocytopenia ([place])   . Diabetes mellitus ([place])   . Morbid obesity ([place])   .  Benign essential HTN   . Hypoalbuminemia due to protein-calorie malnutrition ([place])   . Occult blood in stools   . Gastric polyp   . Lumbar discitis 05/17/2018  . Blood loss anemia 05/12/2018  . Hyperlipidemia associated with type 2 diabetes mellitus ([place]) 05/10/2018  . Essential hypertension 04/29/2018  . CKD (chronic kidney disease), stage IV ([place])   . Diet-controlled diabetes mellitus ([place])   . GERD (gastroesophageal reflux disease)   . Gout   . High cholesterol   . Hypothyroidism   . Iron deficiency anemia   . AV block, Mobitz 2 03/25/2018  . Mobitz type 2 second degree AV block 03/20/2018  . Aortic stenosis 03/20/2018  . Exertional dyspnea 03/19/2018    Past Surgical History:  Procedure Laterality Date  . APPENDECTOMY    . BIOPSY  05/19/2018   Procedure: BIOPSY;  Surgeon: Ladene Artist, MD;  Location: [facility];  Service: Endoscopy;;  . Consuela Mimes W/ RETROGRADES Bilateral 01/14/2019   Procedure: CYSTOSCOPY WITH RETROGRADE PYELOGRAM BILATERAL HYDRODISTENTION;  Surgeon: Ardis Hughs, MD;  Location: [facility];  Service: Urology;  Laterality: Bilateral;  . ESOPHAGOGASTRODUODENOSCOPY N/A 05/19/2018   Procedure: ESOPHAGOGASTRODUODENOSCOPY (EGD);  Surgeon: Ladene Artist, MD;  [facility];  Service: Endoscopy;  Laterality: N/A;  . INSERT / REPLACE / REMOVE PACEMAKER  03/25/2018  . IR  FLUORO GUIDE CV LINE RIGHT  05/18/2018  . IR LUMBAR DISC ASPIRATION W/IMG GUIDE  05/15/2018  . IR REMOVAL TUN CV CATH W/O FL  07/23/2018  . IR US GUIDE VASC ACCESS RIGHT  05/18/2018  . KNEE ARTHROSCOPY Bilateral   . PACEMAKER IMPLANT N/A 03/25/2018   Procedure: PACEMAKER IMPLANT;  Surgeon: Evans Lance, MD;  Location: [facility];  Service: Cardiovascular;  Laterality: N/A;  . PACEMAKER PLACEMENT Left 03/2018  . RIGHT HEART CATH N/A 03/20/2018   Procedure: RIGHT HEART CATH;  Surgeon: Nigel Mormon, MD;  Location: [facility];  Service: Cardiovascular;   Laterality: N/A;  . TEE WITHOUT CARDIOVERSION N/A 03/20/2018   Procedure: TRANSESOPHAGEAL ECHOCARDIOGRAM (TEE);  Surgeon: Nigel Mormon, MD;  Location[facility] END[facility]COPY;  Service: Cardiovascular;  Laterality: N/A;  . TONSILLECTOMY    . URETEROSCOPY WITH HOLMIUM LASER LITHOTRIPSY Right 01/14/2019   Procedure: URETEROSCOPY WITH HOLMIUM LASER LITHOTRIPSY BLADDER BIOPSIES RIGHT STENT PLACEMENT;  Surgeon: Ardis Hughs, MD;  Location: [facility];  Service: Urology;  Laterality: Right;     OB History   No obstetric history on file.      Home Medications    Prior to Admission medications   Medication Sig Start Date End Date Taking? Authorizing Provider  allopurinol (ZYLOPRIM) 100 MG tablet Take 2 tablets (200 mg total) by mouth daily. Erika Fry taking differently: Take 50 mg by mouth daily.  05/05/18  Yes Hongalgi, Lenis Dickinson, MD  Ascorbic Acid (VITAMIN C PO) Take 1 tablet by mouth 2 (two) times daily.   Yes [provider]  Biotin 5 MG CAPS Take 1 capsule (5 mg total) by mouth daily. 01/14/19  Yes Ardis Hughs, MD  cetirizine (ZYRTEC) 10 MG tablet Take 10 mg by mouth at bedtime.    Yes [provider]  diazepam (VALIUM) 10 MG tablet 10 mg See admin instructions. Insert 10 mg vaginally at bedtime   Yes [provider]  famotidine (PEPCID) 10 MG tablet Take 10 mg by mouth daily.   Yes [provider]  ferrous sulfate 325 (65 FE) MG tablet Take 1 tablet (325 mg total) by mouth 2 (two) times daily with a meal. 06/22/18  Yes Love, Ivan Anchors, PA-C  fesoterodine (TOVIAZ) 4 MG TB24 tablet Take 4 mg by mouth at bedtime.   Yes [provider]  fluticasone (FLONASE) 50 MCG/ACT nasal spray Place 2 sprays into both nostrils daily as needed for allergies or rhinitis.   Yes [provider]  folic acid (FOLVITE) 1 MG tablet Take 1 tablet (1 mg total) by mouth daily. 06/22/18  Yes Love, Ivan Anchors, PA-C  furosemide (LASIX) 80 MG tablet Take 1 tablet (80 mg  total) by mouth 2 (two) times daily. Erika Fry taking differently: Take 160 mg by mouth daily.  06/22/18  Yes Love, Ivan Anchors, PA-C  levothyroxine (SYNTHROID, LEVOTHROID) 75 MCG tablet Take 75 mcg by mouth daily before breakfast.   Yes [provider]  Lidocaine 5 % CREA Apply 1 application topically as needed (for pain).   Yes [provider]  mirabegron ER (MYRBETRIQ) 50 MG TB24 tablet Take 50 mg by mouth daily.   Yes [provider]  Multiple Vitamin (MULTI-VITAMIN PO) Take 1 tablet by mouth daily.   Yes [provider]  oxycodone (OXY-IR) 5 MG capsule Take 1-2 capsules (5-10 mg total) by mouth every 4 (four) hours as needed. 01/14/19  Yes Ardis Hughs, MD  potassium chloride (K-DUR) 10  MEQ tablet Take 1 tablet (10 mEq total) by mouth 3 (three) times daily. 06/22/18  Yes Love, Ivan Anchors, PA-C  simvastatin (ZOCOR) 40 MG tablet Take 0.5 tablets (20 mg total) by mouth at bedtime. Erika Fry taking differently: Take 40 mg by mouth at bedtime.  06/22/18  Yes Love, Ivan Anchors, PA-C  vitamin B-12 (CYANOCOBALAMIN) 1000 MCG tablet Take 1 tablet (1,000 mcg total) by mouth daily. 06/22/18  Yes Love, Ivan Anchors, PA-C  Insulin Glargine (LANTUS) 100 UNIT/ML Solostar Pen Inject 20 Units into the skin daily. Erika Fry not taking: Reported on 01/13/2019 06/22/18   Love, Ivan Anchors, PA-C  Insulin Pen Needle 31G X 6 MM MISC 1 application by Does not apply route daily. Erika Fry not taking: Reported on 01/04/2019 06/22/18   Love, Ivan Anchors, PA-C    Erika Fry History Erika Fry History  Problem Relation Age of Onset  . Hypertension Mother   . Diabetes Mellitus II Father   . Heart disease Father   . Gastric cancer Brother   . Diabetes Mellitus II Brother     Social History Social History   Tobacco Use  . Smoking status: Never Smoker  . Smokeless tobacco: Never Used  Substance Use Topics  . Alcohol use: Not Currently  . Drug use: Never     Allergies   Baclofen and Codeine   Review of  Systems Review of Systems  Unable to perform ROS: Mental status change     Physical Exam Updated Vital Signs BP 118/77   Pulse 92   Temp (!) 101 F (38.3 C) (Rectal)   Resp 14   SpO2 92%   Physical Exam Vitals signs and nursing note reviewed.  Constitutional:      Appearance: She is well-developed. She is ill-appearing ([place]).  HENT:     Head: Normocephalic and atraumatic.     Mouth/Throat:     Mouth: Mucous membranes are dry.  Eyes:     Conjunctiva/sclera: Conjunctivae normal.  Neck:     Musculoskeletal: Normal range of motion and neck supple. No neck rigidity.  Cardiovascular:     Rate and Rhythm: Normal rate and regular rhythm.     Heart sounds: Murmur present.  Pulmonary:     Effort: Pulmonary effort is normal. No respiratory distress.     Breath sounds: Normal breath sounds. No stridor.  Abdominal:     General: Abdomen is flat. Bowel sounds are normal. There is no distension.     Palpations: Abdomen is soft. There is no mass.     Tenderness: There is no abdominal tenderness. There is no guarding or rebound.  Musculoskeletal:     Right lower leg: No edema.     Left lower leg: No edema.  Skin:    General: Skin is warm and dry.  Neurological:     Mental Status: She is alert.     Motor: No weakness.     Coordination: Coordination normal.     Comments: Erika Fry is oriented to person, and place, not to time.  She called her Erika Fry by the wrong names.  Psychiatric:        Mood and Affect: Mood normal.        Behavior: Behavior normal.      ED Treatments / Results  Labs (all labs ordered are listed, but only abnormal results are displayed) Labs Reviewed  COMPREHENSIVE METABOLIC PANEL - Abnormal; Notable for the following components:      Result Value   CO2 19 (*)    Glucose,  Bld 183 (*)    BUN 99 (*)    Creatinine, Ser 6.00 (*)    Albumin 3.2 (*)    AST 14 (*)    GFR calc non Af Amer 6 (*)    GFR calc Af Amer 7 (*)    All other components within  normal limits  CBC WITH DIFFERENTIAL/PLATELET - Abnormal; Notable for the following components:   WBC 10.6 (*)    RBC 2.71 (*)    Hemoglobin 8.2 (*)    HCT 26.7 (*)    Neutro Abs 8.2 (*)    Monocytes Absolute 1.3 (*)    All other components within normal limits  URINALYSIS, ROUTINE W REFLEX MICROSCOPIC - Abnormal; Notable for the following components:   APPearance CLOUDY (*)    Glucose, UA 50 (*)    Hgb urine dipstick MODERATE (*)    Protein, ur 100 (*)    Leukocytes,Ua LARGE (*)    WBC, UA >50 (*)    All other components within normal limits  CBG MONITORING, ED - Abnormal; Notable for the following components:   Glucose-Capillary 162 (*)    All other components within normal limits  URINE CULTURE  CULTURE, BLOOD (ROUTINE X 2)  CULTURE, BLOOD (ROUTINE X 2)  RESPIRATORY PANEL BY PCR  LIPASE, BLOOD  LACTIC ACID, PLASMA  LACTIC ACID, PLASMA    EKG None  Radiology Dg Chest 1 View  Result Date: 01/16/2019 CLINICAL DATA:  Cystoscopy yesterday. Right upper quadrant pain with weakness and fatigue. Chronic kidney disease. EXAM: CHEST  1 VIEW COMPARISON:  06/15/2018 and 06/10/2018 FINDINGS: Erika Fry slightly rotated to the left. Left-sided pacemaker unchanged. Lungs are adequately inflated with mild hazy density over the lateral left base which may be due in part to Erika Fry rotation although small left effusion with associated atelectasis is possible. Mild stable cardiomegaly. Remainder of the exam is unchanged. IMPRESSION: Hazy density over the lateral left base likely partially due to Erika Fry rotation although small effusion with atelectasis is possible. Mild stable cardiomegaly. Electronically Signed   By: Marin Olp M.D.   On: 01/16/2019 21:49   Ct Head Wo Contrast  Result Date: 01/16/2019 CLINICAL DATA:  Encephalopathy. EXAM: CT HEAD WITHOUT CONTRAST TECHNIQUE: Contiguous axial images were obtained from the base of the skull through the vertex without intravenous contrast.  COMPARISON:  05/28/2018 FINDINGS: Brain: Ventricles, cisterns and other CSF spaces are within normal. There is no mass, mass effect, shift of midline structures or acute hemorrhage. No evidence of acute infarction. Vascular: No hyperdense vessel or unexpected calcification. Skull: Normal. Negative for fracture or focal lesion. Sinuses/Orbits: No acute finding. Other: None. IMPRESSION: No acute findings. Electronically Signed   By: Marin Olp M.D.   On: 01/16/2019 22:31    Procedures Procedures (including critical care time)  Medications Ordered in ED Medications  sodium chloride 0.9 % bolus 500 mL (0 mLs Intravenous Stopped 01/16/19 2338)  acetaminophen (TYLENOL) tablet 650 mg (650 mg Oral Given 01/16/19 2224)  cefTRIAXone (ROCEPHIN) 1 g in sodium chloride 0.9 % 100 mL IVPB (1 g Intravenous New Bag/Given 01/16/19 2339)     Initial Impression / Assessment and Plan / ED Course  I have reviewed the triage vital signs and the nursing notes.  Pertinent labs & imaging results that were available during my care of the Erika Fry were reviewed by me and considered in my medical decision making (see chart for details).  Clinical Course as of Jan 17 35  Sat Jan 16, 2019  2129 Dr. Sherral Hammers urology at bed side.    [EH]  2356 Erika Fry is now awake and alert.  Eyes are open and she is talking with Erika Fry.  She is now oriented to person, place, and time and appears to be feeling better.  Discussed results that are back so far with Erika Fry and Erika Fry.   [EH]  Sun Jan 17, 2019  0004 Creatinine(!): 6.00 [EH]  0020 Spoke with Dr. Shanon Brow who states that nephrology needs to be consulted to see if she needs to be admitted at [facility] or [facility].   [EH]  [phone] Spoke with nephrologist on-call Dr. Jonnie Finner who recommended admission and [facility].   [EH]    Clinical Course User Index [EH] Lorin Glass, PA-C   Erika Fry presents today with Erika Fry for evaluation of generally feeling weak, unwell, and confused since  last night.  She had recent admission for cystoscopy and has reportedly been declining since then.  On initial exam she is oriented to person and place, not to time which is abnormal for her according to Erika Fry.  She had dry mucous membranes however otherwise he is hemodynamically stable.  Her temperature is elevated at 101.  She is not hypoxic or markedly tachycardic.  Labs show leukocytosis with a count of 10.6.  Hemoglobin is 8.2 which is consistent with her baseline.  Chest x-ray does not show consolidation, suspicious for atelectasis.  Urine appears infected, especially given post procedural state.  She has significant amount of hemoglobin, protein, and large leukocytes.  Urine is sent for cultures.  Blood cultures were ordered.  She is started on IV Rocephin.  She was treated with 650 mg of Tylenol and 500 mL of IV saline after which she had significant improvement in her mental status back to baseline fully alert and oriented.  CT head was obtained which did not show any evidence of acute abnormalities.  Creatinine is elevated today at 6.0.  I spoke with Dr. Shanon Brow who requested that I order a respiratory viral panel and call nephrology to see where Erika Fry needs to be admitted.  Book with nephrology on-call Dr. Jonnie Finner who recommended admission to [facility].  This Erika Fry was seen as a shared visit with Dr. Tomi Bamberger.   Final Clinical Impressions(s) / ED Diagnoses   Final diagnoses:  Fever, unspecified fever cause  Confusion  Dehydration  Acute renal failure superimposed on stage 5 chronic kidney disease, not on chronic dialysis, unspecified acute renal failure type [facility])    ED Discharge Orders    None       Ollen Gross 01/17/19 0040    Dorie Rank, MD 01/17/19 1520

## 2019-01-16 NOTE — ED Notes (Signed)
Bed: WA04 Expected date:  Expected time:  Means of arrival:  Comments: EMS 73 yo female from home weakness and near syncope-recent cystoscopy

## 2019-01-16 NOTE — Anesthesia Postprocedure Evaluation (Signed)
Anesthesia Post Note  Patient: Erika Fry  Procedure(s) Performed: CYSTOSCOPY WITH RETROGRADE PYELOGRAM BILATERAL HYDRODISTENTION (Bilateral Bladder) URETEROSCOPY WITH HOLMIUM LASER LITHOTRIPSY BLADDER BIOPSIES RIGHT STENT PLACEMENT (Right Ureter)     Patient location during evaluation: PACU Anesthesia Type: General Level of consciousness: sedated and patient cooperative Pain management: pain level controlled Vital Signs Assessment: post-procedure vital signs reviewed and stable Respiratory status: spontaneous breathing Cardiovascular status: stable Anesthetic complications: no Comments: Very sleepy in PACU. SpO2 in high 80's on RA, rises with waking/breathing or supplemental O2. [facility] overnight admit for obs.    Last Vitals:  Vitals:   01/14/19 1953 01/15/19 0636  BP: (!) 157/82 138/69  Pulse: 95 (!) 111  Resp: 17   Temp: 37.5 C 37.6 C  SpO2: 97% 92%    Last Pain:  Vitals:   01/15/19 0841  TempSrc:   PainSc: 0-No pain   Pain Goal: Patients Stated Pain Goal: 2 (01/14/19 2128)                 Nolon Nations

## 2019-01-17 ENCOUNTER — Encounter (HOSPITAL_COMMUNITY): Payer: Self-pay | Admitting: General Practice

## 2019-01-17 ENCOUNTER — Inpatient Hospital Stay (HOSPITAL_COMMUNITY): Payer: Medicare Other

## 2019-01-17 DIAGNOSIS — E876 Hypokalemia: Secondary | ICD-10-CM | POA: Diagnosis present

## 2019-01-17 DIAGNOSIS — N132 Hydronephrosis with renal and ureteral calculous obstruction: Secondary | ICD-10-CM | POA: Diagnosis present

## 2019-01-17 DIAGNOSIS — N179 Acute kidney failure, unspecified: Secondary | ICD-10-CM | POA: Diagnosis present

## 2019-01-17 DIAGNOSIS — X58XXXA Exposure to other specified factors, initial encounter: Secondary | ICD-10-CM | POA: Diagnosis present

## 2019-01-17 DIAGNOSIS — K219 Gastro-esophageal reflux disease without esophagitis: Secondary | ICD-10-CM | POA: Diagnosis present

## 2019-01-17 DIAGNOSIS — G9341 Metabolic encephalopathy: Secondary | ICD-10-CM | POA: Diagnosis present

## 2019-01-17 DIAGNOSIS — E1122 Type 2 diabetes mellitus with diabetic chronic kidney disease: Secondary | ICD-10-CM | POA: Diagnosis not present

## 2019-01-17 DIAGNOSIS — N390 Urinary tract infection, site not specified: Secondary | ICD-10-CM | POA: Diagnosis present

## 2019-01-17 DIAGNOSIS — E860 Dehydration: Secondary | ICD-10-CM | POA: Diagnosis present

## 2019-01-17 DIAGNOSIS — I350 Nonrheumatic aortic (valve) stenosis: Secondary | ICD-10-CM | POA: Diagnosis present

## 2019-01-17 DIAGNOSIS — R8281 Pyuria: Secondary | ICD-10-CM | POA: Diagnosis not present

## 2019-01-17 DIAGNOSIS — E039 Hypothyroidism, unspecified: Secondary | ICD-10-CM | POA: Diagnosis present

## 2019-01-17 DIAGNOSIS — S83242A Other tear of medial meniscus, current injury, left knee, initial encounter: Secondary | ICD-10-CM | POA: Diagnosis present

## 2019-01-17 DIAGNOSIS — Z87440 Personal history of urinary (tract) infections: Secondary | ICD-10-CM | POA: Diagnosis not present

## 2019-01-17 DIAGNOSIS — K317 Polyp of stomach and duodenum: Secondary | ICD-10-CM | POA: Diagnosis present

## 2019-01-17 DIAGNOSIS — D649 Anemia, unspecified: Secondary | ICD-10-CM | POA: Diagnosis not present

## 2019-01-17 DIAGNOSIS — R300 Dysuria: Secondary | ICD-10-CM | POA: Diagnosis not present

## 2019-01-17 DIAGNOSIS — R140 Abdominal distension (gaseous): Secondary | ICD-10-CM | POA: Diagnosis not present

## 2019-01-17 DIAGNOSIS — R195 Other fecal abnormalities: Secondary | ICD-10-CM | POA: Diagnosis not present

## 2019-01-17 DIAGNOSIS — D631 Anemia in chronic kidney disease: Secondary | ICD-10-CM | POA: Diagnosis present

## 2019-01-17 DIAGNOSIS — N185 Chronic kidney disease, stage 5: Secondary | ICD-10-CM | POA: Diagnosis not present

## 2019-01-17 DIAGNOSIS — M4626 Osteomyelitis of vertebra, lumbar region: Secondary | ICD-10-CM | POA: Diagnosis present

## 2019-01-17 DIAGNOSIS — D638 Anemia in other chronic diseases classified elsewhere: Secondary | ICD-10-CM | POA: Diagnosis not present

## 2019-01-17 DIAGNOSIS — I441 Atrioventricular block, second degree: Secondary | ICD-10-CM | POA: Diagnosis not present

## 2019-01-17 DIAGNOSIS — G8929 Other chronic pain: Secondary | ICD-10-CM | POA: Diagnosis present

## 2019-01-17 DIAGNOSIS — K921 Melena: Secondary | ICD-10-CM | POA: Diagnosis not present

## 2019-01-17 DIAGNOSIS — N184 Chronic kidney disease, stage 4 (severe): Secondary | ICD-10-CM | POA: Diagnosis present

## 2019-01-17 DIAGNOSIS — R630 Anorexia: Secondary | ICD-10-CM | POA: Diagnosis not present

## 2019-01-17 DIAGNOSIS — Z960 Presence of urogenital implants: Secondary | ICD-10-CM | POA: Diagnosis not present

## 2019-01-17 DIAGNOSIS — Z6832 Body mass index (BMI) 32.0-32.9, adult: Secondary | ICD-10-CM | POA: Diagnosis not present

## 2019-01-17 DIAGNOSIS — M25462 Effusion, left knee: Secondary | ICD-10-CM | POA: Diagnosis not present

## 2019-01-17 DIAGNOSIS — E1169 Type 2 diabetes mellitus with other specified complication: Secondary | ICD-10-CM | POA: Diagnosis present

## 2019-01-17 DIAGNOSIS — M25562 Pain in left knee: Secondary | ICD-10-CM | POA: Diagnosis not present

## 2019-01-17 DIAGNOSIS — E669 Obesity, unspecified: Secondary | ICD-10-CM | POA: Diagnosis present

## 2019-01-17 DIAGNOSIS — R102 Pelvic and perineal pain: Secondary | ICD-10-CM | POA: Diagnosis not present

## 2019-01-17 DIAGNOSIS — R509 Fever, unspecified: Secondary | ICD-10-CM | POA: Diagnosis present

## 2019-01-17 DIAGNOSIS — I10 Essential (primary) hypertension: Secondary | ICD-10-CM | POA: Diagnosis not present

## 2019-01-17 DIAGNOSIS — L89321 Pressure ulcer of left buttock, stage 1: Secondary | ICD-10-CM | POA: Diagnosis not present

## 2019-01-17 DIAGNOSIS — D131 Benign neoplasm of stomach: Secondary | ICD-10-CM | POA: Diagnosis not present

## 2019-01-17 DIAGNOSIS — M1712 Unilateral primary osteoarthritis, left knee: Secondary | ICD-10-CM | POA: Diagnosis present

## 2019-01-17 DIAGNOSIS — E872 Acidosis: Secondary | ICD-10-CM | POA: Diagnosis present

## 2019-01-17 DIAGNOSIS — M4646 Discitis, unspecified, lumbar region: Secondary | ICD-10-CM | POA: Diagnosis present

## 2019-01-17 DIAGNOSIS — L89311 Pressure ulcer of right buttock, stage 1: Secondary | ICD-10-CM | POA: Diagnosis not present

## 2019-01-17 DIAGNOSIS — R109 Unspecified abdominal pain: Secondary | ICD-10-CM | POA: Diagnosis not present

## 2019-01-17 DIAGNOSIS — Z45018 Encounter for adjustment and management of other part of cardiac pacemaker: Secondary | ICD-10-CM | POA: Diagnosis not present

## 2019-01-17 DIAGNOSIS — Z8739 Personal history of other diseases of the musculoskeletal system and connective tissue: Secondary | ICD-10-CM | POA: Diagnosis not present

## 2019-01-17 DIAGNOSIS — L8990 Pressure ulcer of unspecified site, unspecified stage: Secondary | ICD-10-CM

## 2019-01-17 DIAGNOSIS — K8020 Calculus of gallbladder without cholecystitis without obstruction: Secondary | ICD-10-CM | POA: Diagnosis present

## 2019-01-17 DIAGNOSIS — Z950 Presence of cardiac pacemaker: Secondary | ICD-10-CM | POA: Diagnosis not present

## 2019-01-17 LAB — COMPREHENSIVE METABOLIC PANEL
ALT: 5 U/L (ref 0–44)
AST: 14 U/L — ABNORMAL LOW (ref 15–41)
Albumin: 3.2 g/dL — ABNORMAL LOW (ref 3.5–5.0)
Alkaline Phosphatase: 89 U/L (ref 38–126)
Anion gap: 14 (ref 5–15)
BUN: 99 mg/dL — AB (ref 8–23)
CO2: 19 mmol/L — ABNORMAL LOW (ref 22–32)
Calcium: 9.6 mg/dL (ref 8.9–10.3)
Chloride: 102 mmol/L (ref 98–111)
Creatinine, Ser: 6 mg/dL — ABNORMAL HIGH (ref 0.44–1.00)
GFR calc Af Amer: 7 mL/min — ABNORMAL LOW (ref >60)
GFR, EST NON AFRICAN AMERICAN: 6 mL/min — AB (ref >60)
Glucose, Bld: 183 mg/dL — ABNORMAL HIGH (ref 70–99)
POTASSIUM: 3.9 mmol/L (ref 3.5–5.1)
Sodium: 135 mmol/L (ref 135–145)
Total Bilirubin: 0.7 mg/dL (ref 0.3–1.2)
Total Protein: 6.7 g/dL (ref 6.5–8.1)

## 2019-01-17 LAB — RESPIRATORY PANEL BY PCR
Adenovirus: NOT DETECTED
Bordetella pertussis: NOT DETECTED
CORONAVIRUS OC43-RVPPCR: NOT DETECTED
Chlamydophila pneumoniae: NOT DETECTED
Coronavirus 229E: NOT DETECTED
Coronavirus HKU1: NOT DETECTED
Coronavirus NL63: NOT DETECTED
Influenza A: NOT DETECTED
Influenza B: NOT DETECTED
Metapneumovirus: NOT DETECTED
Mycoplasma pneumoniae: NOT DETECTED
PARAINFLUENZA VIRUS 3-RVPPCR: NOT DETECTED
Parainfluenza Virus 1: NOT DETECTED
Parainfluenza Virus 2: NOT DETECTED
Parainfluenza Virus 4: NOT DETECTED
Respiratory Syncytial Virus: NOT DETECTED
Rhinovirus / Enterovirus: NOT DETECTED

## 2019-01-17 LAB — LIPASE, BLOOD: LIPASE: 26 U/L (ref 11–51)

## 2019-01-17 MED ORDER — SODIUM CHLORIDE 0.9 % IV BOLUS
500.0000 mL | Freq: Once | INTRAVENOUS | Status: AC
  Administered 2019-01-17: 500 mL via INTRAVENOUS

## 2019-01-17 MED ORDER — FESOTERODINE FUMARATE ER 4 MG PO TB24
4.0000 mg | ORAL_TABLET | Freq: Every day | ORAL | Status: DC
  Administered 2019-01-17 – 2019-01-28 (×13): 4 mg via ORAL
  Filled 2019-01-17 (×13): qty 1

## 2019-01-17 MED ORDER — HEPARIN SODIUM (PORCINE) 5000 UNIT/ML IJ SOLN
5000.0000 [IU] | Freq: Three times a day (TID) | INTRAMUSCULAR | Status: DC
  Administered 2019-01-18 – 2019-01-25 (×21): 5000 [IU] via SUBCUTANEOUS
  Filled 2019-01-17 (×21): qty 1

## 2019-01-17 MED ORDER — FERROUS SULFATE 325 (65 FE) MG PO TABS
325.0000 mg | ORAL_TABLET | Freq: Two times a day (BID) | ORAL | Status: DC
  Administered 2019-01-17 – 2019-01-29 (×24): 325 mg via ORAL
  Filled 2019-01-17 (×24): qty 1

## 2019-01-17 MED ORDER — FOLIC ACID 1 MG PO TABS
1.0000 mg | ORAL_TABLET | Freq: Every day | ORAL | Status: DC
  Administered 2019-01-17 – 2019-01-29 (×13): 1 mg via ORAL
  Filled 2019-01-17 (×12): qty 1

## 2019-01-17 MED ORDER — FAMOTIDINE 20 MG PO TABS
10.0000 mg | ORAL_TABLET | Freq: Every day | ORAL | Status: DC
  Administered 2019-01-17 – 2019-01-25 (×9): 10 mg via ORAL
  Filled 2019-01-17 (×9): qty 1

## 2019-01-17 MED ORDER — ACETAMINOPHEN 325 MG PO TABS
650.0000 mg | ORAL_TABLET | Freq: Four times a day (QID) | ORAL | Status: DC | PRN
  Administered 2019-01-17 – 2019-01-29 (×20): 650 mg via ORAL
  Filled 2019-01-17 (×22): qty 2

## 2019-01-17 MED ORDER — MIRABEGRON ER 50 MG PO TB24
50.0000 mg | ORAL_TABLET | Freq: Every day | ORAL | Status: DC
  Administered 2019-01-17 – 2019-01-29 (×13): 50 mg via ORAL
  Filled 2019-01-17 (×13): qty 1

## 2019-01-17 MED ORDER — LIDOCAINE 5 % EX OINT
1.0000 "application " | TOPICAL_OINTMENT | CUTANEOUS | Status: DC | PRN
  Filled 2019-01-17: qty 35.44

## 2019-01-17 MED ORDER — SODIUM CHLORIDE 0.9 % IV BOLUS
1500.0000 mL | Freq: Once | INTRAVENOUS | Status: AC
  Administered 2019-01-17: 1500 mL via INTRAVENOUS

## 2019-01-17 MED ORDER — LEVOTHYROXINE SODIUM 75 MCG PO TABS
75.0000 ug | ORAL_TABLET | Freq: Every day | ORAL | Status: DC
  Administered 2019-01-17 – 2019-01-29 (×13): 75 ug via ORAL
  Filled 2019-01-17 (×12): qty 1

## 2019-01-17 MED ORDER — ACETAMINOPHEN 650 MG RE SUPP
650.0000 mg | Freq: Four times a day (QID) | RECTAL | Status: DC | PRN
  Filled 2019-01-17: qty 1

## 2019-01-17 MED ORDER — VITAMIN B-12 1000 MCG PO TABS
1000.0000 ug | ORAL_TABLET | Freq: Every day | ORAL | Status: DC
  Administered 2019-01-17 – 2019-01-29 (×13): 1000 ug via ORAL
  Filled 2019-01-17 (×13): qty 1

## 2019-01-17 MED ORDER — FLUTICASONE PROPIONATE 50 MCG/ACT NA SUSP
2.0000 | Freq: Every day | NASAL | Status: DC | PRN
  Filled 2019-01-17: qty 16

## 2019-01-17 MED ORDER — SODIUM CHLORIDE 0.9 % IV SOLN
1.0000 g | INTRAVENOUS | Status: DC
  Administered 2019-01-17 – 2019-01-18 (×2): 1 g via INTRAVENOUS
  Filled 2019-01-17 (×2): qty 10

## 2019-01-17 MED ORDER — SODIUM CHLORIDE 0.9 % IV SOLN
INTRAVENOUS | Status: DC
  Administered 2019-01-17 (×2): via INTRAVENOUS

## 2019-01-17 NOTE — H&P (Signed)
History and Physical    Erika Fry:[id number] DOB: [birth year] DOA: 01/16/2019  PCP: [name], MD  Patient coming from: Home  Chief Complaint: Not eating and drinking  HPI: Erika Fry is a 73 y.o. female with medical history significant for chronic kidney disease, hypertension, diabetes with recent discharge from urology service less than 48 hours ago for hematuria and pelvic pain with right stent placement discharged home has not eaten or drinking very well.  Patient had an appointment with her nephrologist last week who she was told she may need dialysis in the near future she has not a follow-up appointment with him on this coming Monday to decide on trying to get access for dialysis and for repeat labs.  She denies any fevers.  She denies any nausea vomiting diarrhea.  She denies any shortness of breath.  Family reports she is been very confused and she has not been as active as normal.  Patient find to have worsening kidney failure and referred for admission for fever and acute on chronic kidney disease.  Nephrology has been called at [facility] are recommending patient be transferred to [facility] for further evaluation and treatment.  Patient not aware she was running fever.  She denies any flulike symptoms.  She has been still having dysuria however since her procedure last week and has not been on antibiotics.   Review of Systems: As per HPI otherwise 10 point review of systems negative per family  Past Medical History:  Diagnosis Date  . Arthritis    "knees, thumbs" (03/25/2018)  . CKD (chronic kidney disease), stage IV ([place])   . Diet-controlled diabetes mellitus ([place])   . GERD (gastroesophageal reflux disease)   . Gout    "on daily RX" (03/25/2018)  . Heart murmur   . High cholesterol   . Hypertension   . Hypothyroidism   . Iron deficiency anemia    "had to get an iron infusion"  . Migraine    "used to have them growing up" (03/25/2018)  . Presence of  permanent cardiac pacemaker 03/25/2018    Past Surgical History:  Procedure Laterality Date  . APPENDECTOMY    . BIOPSY  05/19/2018   Procedure: BIOPSY;  Surgeon: Ladene Artist, MD;  Location: [place];  Service: Endoscopy;;  . Consuela Mimes W/ RETROGRADES Bilateral 01/14/2019   Procedure: CYSTOSCOPY WITH RETROGRADE PYELOGRAM BILATERAL HYDRODISTENTION;  Surgeon: Ardis Hughs, MD;  Location: [facility];  Service: Urology;  Laterality: Bilateral;  . ESOPHAGOGASTRODUODENOSCOPY N/A 05/19/2018   Procedure: ESOPHAGOGASTRODUODENOSCOPY (EGD);  Surgeon: Ladene Artist, MD;  Location: [facility]COPY;  Service: Endoscopy;  Laterality: N/A;  . INSERT / REPLACE / REMOVE PACEMAKER  03/25/2018  . IR FLUORO GUIDE CV LINE RIGHT  05/18/2018  . IR LUMBAR DISC ASPIRATION W/IMG GUIDE  05/15/2018  . IR REMOVAL TUN CV CATH W/O FL  07/23/2018  . IR US GUIDE VASC ACCESS RIGHT  05/18/2018  . KNEE ARTHROSCOPY Bilateral   . PACEMAKER IMPLANT N/A 03/25/2018   Procedure: PACEMAKER IMPLANT;  Surgeon: Evans Lance, MD;  Location: [place] CV LAB;  Service: Cardiovascular;  Laterality: N/A;  . PACEMAKER PLACEMENT Left 03/2018  . RIGHT HEART CATH N/A 03/20/2018   Procedure: RIGHT HEART CATH;  Surgeon: Nigel Mormon, MD;  Location: [place] [facility];  Service: Cardiovascular;  Laterality: N/A;  . TEE WITHOUT CARDIOVERSION N/A 03/20/2018   Procedure: TRANSESOPHAGEAL ECHOCARDIOGRAM (TEE);  Surgeon: Nigel Mormon, MD;  Location: [facility]OS[facility];  Service: Cardiovascular;  Laterality: N/A;  . TONSILLECTOMY    . URETEROSCOPY WITH HOLMIUM LASER LITHOTRIPSY Right 01/14/2019   Procedure: URETEROSCOPY WITH HOLMIUM LASER LITHOTRIPSY BLADDER BIOPSIES RIGHT STENT PLACEMENT;  Surgeon: Ardis Hughs, MD;  Location: [facility];  Service: Urology;  Laterality: Right;     reports that she has never smoked. She has never used smokeless tobacco. She reports previous alcohol use. She reports that she does not use  drugs.  Allergies  Allergen Reactions  . Baclofen Other (See Comments)    somnolence  . Codeine Other (See Comments)    Increases Pain and couldn't sleep    Family History  Problem Relation Age of Onset  . Hypertension Mother   . Diabetes Mellitus II Father   . Heart disease Father   . Gastric cancer Brother   . Diabetes Mellitus II Brother     Prior to Admission medications   Medication Sig Start Date End Date Taking? Authorizing Provider  allopurinol (ZYLOPRIM) 100 MG tablet Take 2 tablets (200 mg total) by mouth daily. Patient taking differently: Take 50 mg by mouth daily.  05/05/18  Yes Hongalgi, Lenis Dickinson, MD  Ascorbic Acid (VITAMIN C PO) Take 1 tablet by mouth 2 (two) times daily.   Yes [provider]  Biotin 5 MG CAPS Take 1 capsule (5 mg total) by mouth daily. 01/14/19  Yes Ardis Hughs, MD  cetirizine (ZYRTEC) 10 MG tablet Take 10 mg by mouth at bedtime.    Yes [provider]  diazepam (VALIUM) 10 MG tablet 10 mg See admin instructions. Insert 10 mg vaginally at bedtime   Yes [provider]  famotidine (PEPCID) 10 MG tablet Take 10 mg by mouth daily.   Yes [provider]  ferrous sulfate 325 (65 FE) MG tablet Take 1 tablet (325 mg total) by mouth 2 (two) times daily with a meal. 06/22/18  Yes Love, Ivan Anchors, PA-C  fesoterodine (TOVIAZ) 4 MG TB24 tablet Take 4 mg by mouth at bedtime.   Yes [provider]  fluticasone (FLONASE) 50 MCG/ACT nasal spray Place 2 sprays into both nostrils daily as needed for allergies or rhinitis.   Yes [provider]  folic acid (FOLVITE) 1 MG tablet Take 1 tablet (1 mg total) by mouth daily. 06/22/18  Yes Love, Ivan Anchors, PA-C  furosemide (LASIX) 80 MG tablet Take 1 tablet (80 mg total) by mouth 2 (two) times daily. Patient taking differently: Take 160 mg by mouth daily.  06/22/18  Yes Love, Ivan Anchors, PA-C  levothyroxine (SYNTHROID, LEVOTHROID) 75 MCG tablet Take 75 mcg by mouth daily  before breakfast.   Yes [provider]  Lidocaine 5 % CREA Apply 1 application topically as needed (for pain).   Yes [provider]  mirabegron ER (MYRBETRIQ) 50 MG TB24 tablet Take 50 mg by mouth daily.   Yes [provider]  Multiple Vitamin (MULTI-VITAMIN PO) Take 1 tablet by mouth daily.   Yes [provider]  oxycodone (OXY-IR) 5 MG capsule Take 1-2 capsules (5-10 mg total) by mouth every 4 (four) hours as needed. 01/14/19  Yes Ardis Hughs, MD  potassium chloride (K-DUR) 10 MEQ tablet Take 1 tablet (10 mEq total) by mouth 3 (three) times daily. 06/22/18  Yes Love, Ivan Anchors, PA-C  simvastatin (ZOCOR) 40 MG tablet Take 0.5 tablets (20 mg total) by mouth at bedtime. Patient taking differently: Take 40 mg by mouth at bedtime.  06/22/18  Yes Love, Ivan Anchors,  PA-C  vitamin B-12 (CYANOCOBALAMIN) 1000 MCG tablet Take 1 tablet (1,000 mcg total) by mouth daily. 06/22/18  Yes Love, Ivan Anchors, PA-C  Insulin Glargine (LANTUS) 100 UNIT/ML Solostar Pen Inject 20 Units into the skin daily. Patient not taking: Reported on 01/13/2019 06/22/18   Love, Ivan Anchors, PA-C  Insulin Pen Needle 31G X 6 MM MISC 1 application by Does not apply route daily. Patient not taking: Reported on 01/04/2019 06/22/18   Flora Lipps    Physical Exam: Vitals:   01/16/19 2048 01/16/19 2151 01/16/19 2230  BP: (!) 145/76  118/77  Pulse: 98  92  Resp: 16  14  Temp: 98.2 F (36.8 C) (!) 101 F (38.3 C)   TempSrc: Oral Rectal   SpO2: 96%  92%      Constitutional: NAD, calm, comfortable Vitals:   01/16/19 2048 01/16/19 2151 01/16/19 2230  BP: (!) 145/76  118/77  Pulse: 98  92  Resp: 16  14  Temp: 98.2 F (36.8 C) (!) 101 F (38.3 C)   TempSrc: Oral Rectal   SpO2: 96%  92%   Eyes: PERRL, lids and conjunctivae normal ENMT: Mucous membranes are moist. Posterior pharynx clear of any exudate or lesions.Normal dentition.  Neck: normal, supple, no masses, no  thyromegaly Respiratory: clear to auscultation bilaterally, no wheezing, no crackles. Normal respiratory effort. No accessory muscle use.  Cardiovascular: Regular rate and rhythm, no murmurs / rubs / gallops. No extremity edema. 2+ pedal pulses. No carotid bruits.  Abdomen: no tenderness, no masses palpated. No hepatosplenomegaly. Bowel sounds positive.  Musculoskeletal: no clubbing / cyanosis. No joint deformity upper and lower extremities. Good ROM, no contractures. Normal muscle tone.  Skin: no rashes, lesions, ulcers. No induration Neurologic: CN 2-12 grossly intact. Sensation intact, DTR normal. Strength 5/5 in all 4.  Psychiatric: Normal judgment and insight. Alert and oriented x 3. Normal mood.    Labs on Admission: I have personally reviewed following labs and imaging studies  CBC: Recent Labs  Lab 01/13/19 1439 01/16/19 2241  WBC 7.9 10.6*  NEUTROABS  --  8.2*  HGB 8.6* 8.2*  HCT 27.2* 26.7*  MCV 95.1 98.5  PLT 246 673   Basic Metabolic Panel: Recent Labs  Lab 01/13/19 1439 01/16/19 2241  NA 136 135  K 3.5 3.9  CL 100 102  CO2 23 19*  GLUCOSE 114* 183*  BUN 94* 99*  CREATININE 5.34* 6.00*  CALCIUM 10.1 9.6   GFR: Estimated Creatinine Clearance: 8.8 mL/min (A) (by C-G formula based on SCr of 6 mg/dL (H)). Liver Function Tests: Recent Labs  Lab 01/16/19 2241  AST 14*  ALT 5  ALKPHOS 89  BILITOT 0.7  PROT 6.7  ALBUMIN 3.2*   Recent Labs  Lab 01/16/19 2241  LIPASE 26   No results for input(s): AMMONIA in the last 168 hours. Coagulation Profile: No results for input(s): INR, PROTIME in the last 168 hours. Cardiac Enzymes: No results for input(s): CKTOTAL, CKMB, CKMBINDEX, TROPONINI in the last 168 hours. BNP (last 3 results) No results for input(s): PROBNP in the last 8760 hours. HbA1C: No results for input(s): HGBA1C in the last 72 hours. CBG: Recent Labs  Lab 01/13/19 1347 01/14/19 0559 01/14/19 1023 01/16/19 2334  GLUCAP 115* 110* 150*  162*   Lipid Profile: No results for input(s): CHOL, HDL, LDLCALC, TRIG, CHOLHDL, LDLDIRECT in the last 72 hours. Thyroid Function Tests: No results for input(s): TSH, T4TOTAL, FREET4, T3FREE, THYROIDAB in the last 72 hours. Anemia  Panel: No results for input(s): VITAMINB12, FOLATE, FERRITIN, TIBC, IRON, RETICCTPCT in the last 72 hours. Urine analysis:    Component Value Date/Time   COLORURINE YELLOW 01/16/2019 2151   APPEARANCEUR CLOUDY (A) 01/16/2019 2151   LABSPEC 1.012 01/16/2019 2151   PHURINE 6.0 01/16/2019 2151   GLUCOSEU 50 (A) 01/16/2019 2151   HGBUR MODERATE (A) 01/16/2019 2151   BILIRUBINUR NEGATIVE 01/16/2019 2151   [place] NEGATIVE 01/16/2019 2151   PROTEINUR 100 (A) 01/16/2019 2151   NITRITE NEGATIVE 01/16/2019 2151   LEUKOCYTESUR LARGE (A) 01/16/2019 2151   Sepsis Labs: !!!!!!!!!!!!!!!!!!!!!!!!!!!!!!!!!!!!!!!!!!!! @LABRCNTIP (procalcitonin:4,lacticidven:4) )No results found for this or any previous visit (from the past 240 hour(s)).   Radiological Exams on Admission: Dg Chest 1 View  Result Date: 01/16/2019 CLINICAL DATA:  Cystoscopy yesterday. Right upper quadrant pain with weakness and fatigue. Chronic kidney disease. EXAM: CHEST  1 VIEW COMPARISON:  06/15/2018 and 06/10/2018 FINDINGS: Patient slightly rotated to the left. Left-sided pacemaker unchanged. Lungs are adequately inflated with mild hazy density over the lateral left base which may be due in part to patient rotation although small left effusion with associated atelectasis is possible. Mild stable cardiomegaly. Remainder of the exam is unchanged. IMPRESSION: Hazy density over the lateral left base likely partially due to patient rotation although small effusion with atelectasis is possible. Mild stable cardiomegaly. Electronically Signed   By: Marin Olp M.D.   On: 01/16/2019 21:49   Ct Head Wo Contrast  Result Date: 01/16/2019 CLINICAL DATA:  Encephalopathy. EXAM: CT HEAD WITHOUT CONTRAST TECHNIQUE:  Contiguous axial images were obtained from the base of the skull through the vertex without intravenous contrast. COMPARISON:  05/28/2018 FINDINGS: Brain: Ventricles, cisterns and other CSF spaces are within normal. There is no mass, mass effect, shift of midline structures or acute hemorrhage. No evidence of acute infarction. Vascular: No hyperdense vessel or unexpected calcification. Skull: Normal. Negative for fracture or focal lesion. Sinuses/Orbits: No acute finding. Other: None. IMPRESSION: No acute findings. Electronically Signed   By: Marin Olp M.D.   On: 01/16/2019 22:31    Old chart reviewed Case discussed with ED PA Chest x-ray reviewed no edema or infiltrate with malrotation difficult to clearly see left lung base  Assessment/Plan 73 year old female with dysuria and UTI with worsening renal failure Principal Problem:   Fever-likely from urine.  Placed on IV Rocephin.  Follow-up on blood and urine cultures.  Check quick flu.  Patient not septic.  Active Problems:   Acute lower UTI-Rocephin as above    Aortic stenosis-noted    CKD (chronic kidney disease), stage IV (HCC)-with acute kidney injury creatinine up to 6.  May be prerenal and secondary to dehydration.  Patient has been spoken to about dialysis however.  Will place on IV fluids overnight treat infection and repeat BMP in the morning.  Will transfer to [facility] for nephrology evaluation in case she needs to have dialysis.  There is no emergent need for dialysis at this time however her response to the above treatment over the next several days will determine whether or not that needs to be started.    Anemia of chronic disease-stable    Benign essential HTN-resume home meds    DVT prophylaxis: SCDs Code Status: Full Family Communication: 2 sons and 2 daughter-in-law's Disposition Plan: Days Consults called: Nephrology Admission status: Admission   Trina Asch A MD [facility]  If 7PM-7AM, please  contact night-coverage [email or web address] Password [facility]  01/17/2019, 12:51 AM

## 2019-01-17 NOTE — Progress Notes (Signed)
Bladder scan not done per Dr. Jonnie Finner.

## 2019-01-17 NOTE — Progress Notes (Signed)
PROGRESS NOTE                                                                                                                                                                                                             Patient Demographics:    Erika Fry, is a 73 y.o. female, DOB - 11-Jun-1946, JGG:[id number]  Admit date - 01/16/2019   Admitting Physician Phillips Grout, MD  Outpatient Primary MD for the patient is Avva, Ravisankar, MD  LOS - 0   No chief complaint on file.      Brief Narrative    73 y.o. female with medical history significant for chronic kidney disease, hypertension, diabetes with recent discharge from urology service less than 48 hours ago for hematuria and pelvic pain with right stent placement discharged home, patient presents to ED as has significantly poor appetite, no eating or drinking, no more lethargic, her work-up noted for BUN of 99, and creatinine of 6, will she has positive urine analysis, she does report dysuria, she is admitted for further work-up.   Subjective:    Erika Fry today denies any chest pain, shortness of breath, reports she is not hungry when I have asked her what she did not eating her breakfast .  She does report dysuria as well  Assessment  & Plan :    Principal Problem:   Fever Active Problems:   Aortic stenosis   CKD (chronic kidney disease), stage IV (HCC)   Anemia of chronic disease   Benign essential HTN   Acute lower UTI   Pressure injury of skin   Fever -Likely related to UTI, as he reports dysuria, and her UA strongly positive, continue with IV Rocephin, follow blood cultures, urine cultures, respiratory panel is negative. -Patient with recent stent insertion and urinary intervention, neurology consult appreciated.  Acute lower UTI -Rocephin as above  Aortic stenosis -noted  CKD (chronic kidney disease), stage IV (HCC) -Creatinine has been trending gradually over  the last few months, as well her BUN is elevated, admission note patient was supposed to have meeting on Monday to discuss dialysis options. -On IV fluids currently, I will decrease to 75 cc/h, renal has been consulted.  Acute embolic encephalopathy  -Most likely in the setting of uremia, and UTI, monitor clinically .  Anemia of chronic disease-stable  Benign essential  HTN-resume home meds     Code Status : full  Family Communication  : none at bedside  Disposition Plan  : pending further work up  Barriers For Discharge : On IV antibiotics, he is altered awaiting renal evaluation  Consults  : Renal, urology  Procedures  : None  DVT Prophylaxis  : SCD  Lab Results  Component Value Date   PLT 173 01/16/2019    Antibiotics  :    Anti-infectives (From admission, onward)   Start     Dose/Rate Route Frequency Ordered Stop   01/17/19 2200  cefTRIAXone (ROCEPHIN) 1 g in sodium chloride 0.9 % 100 mL IVPB     1 g 200 mL/hr over 30 Minutes Intravenous Every 24 hours 01/17/19 0054     01/16/19 2345  cefTRIAXone (ROCEPHIN) 1 g in sodium chloride 0.9 % 100 mL IVPB     1 g 200 mL/hr over 30 Minutes Intravenous  Once 01/16/19 2330 01/17/19 0055        Objective:   Vitals:   01/17/19 0045 01/17/19 0156 01/17/19 0452 01/17/19 0719  BP: 122/78 136/77 118/74 124/73  Pulse: (!) 101 98 98 96  Resp: 12  (!) 22   Temp:  98.2 F (36.8 C) 98.4 F (36.9 C) 98 F (36.7 C)  TempSrc:  Oral  Oral  SpO2: 98% 99% 97% 96%    Wt Readings from Last 3 Encounters:  01/14/19 83 kg  12/21/18 84.9 kg  11/16/18 78.5 kg     Intake/Output Summary (Last 24 hours) at 01/17/2019 1105 Last data filed at 01/17/2019 1000 Gross per 24 hour  Intake 918.6 ml  Output -  Net 918.6 ml     Physical Exam  Patient awake, confused, agitated, but open eyes and communicates, but significantly forgetful  Symmetrical Chest wall movement, Good air movement bilaterally, CTAB RRR,No Gallops,Rubs , No  Parasternal Heave +ve B.Sounds, Abd Soft, No tenderness, No rebound - guarding or rigidity. No Cyanosis, Clubbing or edema, No new Rash or bruise      Data Review:    CBC Recent Labs  Lab 01/13/19 1439 01/16/19 2241  WBC 7.9 10.6*  HGB 8.6* 8.2*  HCT 27.2* 26.7*  PLT 246 173  MCV 95.1 98.5  MCH 30.1 30.3  MCHC 31.6 30.7  RDW 13.7 14.3  LYMPHSABS  --  0.7  MONOABS  --  1.3*  EOSABS  --  0.2  BASOSABS  --  0.0    Chemistries  Recent Labs  Lab 01/13/19 1439 01/16/19 2241  NA 136 135  K 3.5 3.9  CL 100 102  CO2 23 19*  GLUCOSE 114* 183*  BUN 94* 99*  CREATININE 5.34* 6.00*  CALCIUM 10.1 9.6  AST  --  14*  ALT  --  5  ALKPHOS  --  89  BILITOT  --  0.7   ------------------------------------------------------------------------------------------------------------------ No results for input(s): CHOL, HDL, LDLCALC, TRIG, CHOLHDL, LDLDIRECT in the last 72 hours.  Lab Results  Component Value Date   HGBA1C 6.3 (H) 04/30/2018   ------------------------------------------------------------------------------------------------------------------ No results for input(s): TSH, T4TOTAL, T3FREE, THYROIDAB in the last 72 hours.  Invalid input(s): FREET3 ------------------------------------------------------------------------------------------------------------------ No results for input(s): VITAMINB12, FOLATE, FERRITIN, TIBC, IRON, RETICCTPCT in the last 72 hours.  Coagulation profile No results for input(s): INR, PROTIME in the last 168 hours.  No results for input(s): DDIMER in the last 72 hours.  Cardiac Enzymes No results for input(s): CKMB, TROPONINI, MYOGLOBIN in the last 168 hours.  Invalid  input(s): CK ------------------------------------------------------------------------------------------------------------------    Component Value Date/Time   BNP 228.6 (H) 06/07/2018 0227    Inpatient Medications  Scheduled Meds: . famotidine  10 mg Oral Daily  .  ferrous sulfate  325 mg Oral BID WC  . fesoterodine  4 mg Oral QHS  . folic acid  1 mg Oral Daily  . levothyroxine  75 mcg Oral QAC breakfast  . mirabegron ER  50 mg Oral Daily  . vitamin B-12  1,000 mcg Oral Daily   Continuous Infusions: . sodium chloride 100 mL/hr at 01/17/19 0327  . cefTRIAXone (ROCEPHIN)  IV     PRN Meds:.acetaminophen **OR** acetaminophen, fluticasone, lidocaine  Micro Results Recent Results (from the past 240 hour(s))  Respiratory Panel by PCR     Status: None   Collection Time: 01/17/19 12:23 AM  Result Value Ref Range Status   Adenovirus NOT DETECTED NOT DETECTED Final   Coronavirus 229E NOT DETECTED NOT DETECTED Final    Comment: (NOTE) The Coronavirus on the Respiratory Panel, DOES NOT test for the novel  Coronavirus (2019 nCoV)    Coronavirus HKU1 NOT DETECTED NOT DETECTED Final   Coronavirus NL63 NOT DETECTED NOT DETECTED Final   Coronavirus OC43 NOT DETECTED NOT DETECTED Final   Metapneumovirus NOT DETECTED NOT DETECTED Final   Rhinovirus / Enterovirus NOT DETECTED NOT DETECTED Final   Influenza A NOT DETECTED NOT DETECTED Final   Influenza B NOT DETECTED NOT DETECTED Final   Parainfluenza Virus 1 NOT DETECTED NOT DETECTED Final   Parainfluenza Virus 2 NOT DETECTED NOT DETECTED Final   Parainfluenza Virus 3 NOT DETECTED NOT DETECTED Final   Parainfluenza Virus 4 NOT DETECTED NOT DETECTED Final   Respiratory Syncytial Virus NOT DETECTED NOT DETECTED Final   Bordetella pertussis NOT DETECTED NOT DETECTED Final   Chlamydophila pneumoniae NOT DETECTED NOT DETECTED Final   Mycoplasma pneumoniae NOT DETECTED NOT DETECTED Final    Comment: Performed at [facility], [place] [address] [place], [place] [address]    Radiology Reports Dg Chest 1 View  Result Date: 01/16/2019 CLINICAL DATA:  Cystoscopy yesterday. Right upper quadrant pain with weakness and fatigue. Chronic kidney disease. EXAM: CHEST  1 VIEW COMPARISON:  06/15/2018 and 06/10/2018  FINDINGS: Patient slightly rotated to the left. Left-sided pacemaker unchanged. Lungs are adequately inflated with mild hazy density over the lateral left base which may be due in part to patient rotation although small left effusion with associated atelectasis is possible. Mild stable cardiomegaly. Remainder of the exam is unchanged. IMPRESSION: Hazy density over the lateral left base likely partially due to patient rotation although small effusion with atelectasis is possible. Mild stable cardiomegaly. Electronically Signed   By: Marin Olp M.D.   On: 01/16/2019 21:49   Ct Head Wo Contrast  Result Date: 01/16/2019 CLINICAL DATA:  Encephalopathy. EXAM: CT HEAD WITHOUT CONTRAST TECHNIQUE: Contiguous axial images were obtained from the base of the skull through the vertex without intravenous contrast. COMPARISON:  05/28/2018 FINDINGS: Brain: Ventricles, cisterns and other CSF spaces are within normal. There is no mass, mass effect, shift of midline structures or acute hemorrhage. No evidence of acute infarction. Vascular: No hyperdense vessel or unexpected calcification. Skull: Normal. Negative for fracture or focal lesion. Sinuses/Orbits: No acute finding. Other: None. IMPRESSION: No acute findings. Electronically Signed   By: Marin Olp M.D.   On: 01/16/2019 22:31   Dg C-arm 1-60 Min-no Report  Result Date: 01/14/2019 Fluoroscopy was utilized by the requesting physician.  No  radiographic interpretation.      Phillips Climes M.D on 01/17/2019 at 11:05 AM  Between 7am to 7pm - Pager - [phone]  After 7pm go to [email or web address] - password [employer]  [facility] -  Office  [phone]

## 2019-01-17 NOTE — Consult Note (Signed)
Renal Service Consult Note [facility]  Erika Fry 01/17/2019 [place]azing Requesting Physician:  Dr Waldron Labs  Reason for Consult:  Acute renal failure on CKD IV HPI: The patient is a 73 y.o. year-old w hx of CKD IV, HTN, DM2, who was recently admitted by urology for hematuria, hx of voiding dysfunction and pelvic pain.  A CT scan in 05/2018 showed a large stone in the R kidney and smaller stone in the left. The procedure was done for pelvic pain and included cysto, HOD, and bilat retrograde pyelograms.  The procedure included bladder biopsy w/ fulguration, R kidney stone lithotripsy and extraction and R ureteral stent placement. She was dc'd the next day on 2/14. She presented to ED on 2/16 early am with c/o not eating or drinking well. She had seen her nephrologist last week and they noted she may need HD in the near future.  She denies N/V/D , SOB. She was confused on admission, creat was up to 6.0 (more recently 2.5- 3.5) and she had a fever.  She was admitted and started on IVF's for suspected dehydration and IV abx for suspected infection/ UTI.  We are asked to see for renal failure.   Pt is a very poor historian, is lethargic but awakens to answer a few questions then falls back asleep. K is 3.9, BUN 99, creat 6.0 today.   She is mostly oriented. 3 days ago pre cysto the creat was 5.34.   Back on Jan 20, the creat was 3.24.    She had discitis episode in June 2019 treated w/ long course of po doxy and f/b Dr Baxter Flattery.  She has PPM placed for heart block.    ROS  denies CP  no joint pain   no HA  no blurry vision  no rash  no diarrhea  no nausea/ vomiting  Past Medical History  Past Medical History:  Diagnosis Date  . Arthritis    "knees, thumbs" (03/25/2018)  . CKD (chronic kidney disease), stage IV ([place])   . Diet-controlled diabetes mellitus ([place])   . GERD (gastroesophageal reflux disease)   . Gout    "on daily RX" (03/25/2018)  . Heart murmur   .  High cholesterol   . Hypertension   . Hypothyroidism   . Iron deficiency anemia    "had to get an iron infusion"  . Migraine    "used to have them growing up" (03/25/2018)  . Presence of permanent cardiac pacemaker 03/25/2018   Past Surgical History  Past Surgical History:  Procedure Laterality Date  . APPENDECTOMY    . BIOPSY  05/19/2018   Procedure: BIOPSY;  Surgeon: Ladene Artist, MD;  Location: [place];  Service: Endoscopy;;  . Consuela Mimes W/ RETROGRADES Bilateral 01/14/2019   Procedure: CYSTOSCOPY WITH RETROGRADE PYELOGRAM BILATERAL HYDRODISTENTION;  Surgeon: Ardis Hughs, MD;  Location: [facility];  Service: Urology;  Laterality: Bilateral;  . ESOPHAGOGASTRODUODENOSCOPY N/A 05/19/2018   Procedure: ESOPHAGOGASTRODUODENOSCOPY (EGD);  Surgeon: Ladene Artist, MD;  Location[facility];  Service: Endoscopy;  Laterality: N/A;  . INSERT / REPLACE / REMOVE PACEMAKER  03/25/2018  . IR FLUORO GUIDE CV LINE RIGHT  05/18/2018  . IR LUMBAR DISC ASPIRATION W/IMG GUIDE  05/15/2018  . IR REMOVAL TUN CV CATH W/O FL  07/23/2018  . IR US GUIDE VASC ACCESS RIGHT  05/18/2018  . KNEE ARTHROSCOPY Bilateral   . PACEMAKER IMPLANT N/A 03/25/2018   Procedure: PACEMAKER IMPLANT;  Surgeon: Evans Lance, MD;  Location: [place] CV [facility];  Service: Cardiovascular;  Laterality: N/A;  . PACEMAKER PLACEMENT Left 03/2018  . RIGHT HEART CATH N/A 03/20/2018   Procedure: RIGHT HEART CATH;  Surgeon: Nigel Mormon, MD;  Location: [place] [facility];  Service: Cardiovascular;  Laterality: N/A;  . TEE WITHOUT CARDIOVERSION N/A 03/20/2018   Procedure: TRANSESOPHAGEAL ECHOCARDIOGRAM (TEE);  Surgeon: Nigel Mormon, MD;  Location: [facility] ENDOSCOPY;  Service: Cardiovascular;  Laterality: N/A;  . TONSILLECTOMY    . URETEROSCOPY WITH HOLMIUM LASER LITHOTRIPSY Right 01/14/2019   Procedure: URETEROSCOPY WITH HOLMIUM LASER LITHOTRIPSY BLADDER BIOPSIES RIGHT STENT PLACEMENT;  Surgeon: Ardis Hughs, MD;   Location: [facility];  Service: Urology;  Laterality: Right;   Family History  Family History  Problem Relation Age of Onset  . Hypertension Mother   . Diabetes Mellitus II Father   . Heart disease Father   . Gastric cancer Brother   . Diabetes Mellitus II Brother    Social History  reports that she has never smoked. She has never used smokeless tobacco. She reports previous alcohol use. She reports that she does not use drugs. Allergies  Allergies  Allergen Reactions  . Baclofen Other (See Comments)    somnolence  . Codeine Other (See Comments)    Increases Pain and couldn't sleep   Home medications Prior to Admission medications   Medication Sig Start Date End Date Taking? Authorizing Provider  allopurinol (ZYLOPRIM) 100 MG tablet Take 2 tablets (200 mg total) by mouth daily. Patient taking differently: Take 50 mg by mouth daily.  05/05/18  Yes Hongalgi, Lenis Dickinson, MD  Ascorbic Acid (VITAMIN C PO) Take 1 tablet by mouth 2 (two) times daily.   Yes [provider]  Biotin 5 MG CAPS Take 1 capsule (5 mg total) by mouth daily. 01/14/19  Yes Ardis Hughs, MD  cetirizine (ZYRTEC) 10 MG tablet Take 10 mg by mouth at bedtime.    Yes [provider]  diazepam (VALIUM) 10 MG tablet 10 mg See admin instructions. Insert 10 mg vaginally at bedtime   Yes [provider]  famotidine (PEPCID) 10 MG tablet Take 10 mg by mouth daily.   Yes [provider]  ferrous sulfate 325 (65 FE) MG tablet Take 1 tablet (325 mg total) by mouth 2 (two) times daily with a meal. 06/22/18  Yes Love, Ivan Anchors, PA-C  fesoterodine (TOVIAZ) 4 MG TB24 tablet Take 4 mg by mouth at bedtime.   Yes [provider]  fluticasone (FLONASE) 50 MCG/ACT nasal spray Place 2 sprays into both nostrils daily as needed for allergies or rhinitis.   Yes [provider]  folic acid (FOLVITE) 1 MG tablet Take 1 tablet (1 mg total) by mouth daily. 06/22/18  Yes Love, Ivan Anchors, PA-C   furosemide (LASIX) 80 MG tablet Take 1 tablet (80 mg total) by mouth 2 (two) times daily. Patient taking differently: Take 160 mg by mouth daily.  06/22/18  Yes Love, Ivan Anchors, PA-C  levothyroxine (SYNTHROID, LEVOTHROID) 75 MCG tablet Take 75 mcg by mouth daily before breakfast.   Yes [provider]  Lidocaine 5 % CREA Apply 1 application topically as needed (for pain).   Yes [provider]  mirabegron ER (MYRBETRIQ) 50 MG TB24 tablet Take 50 mg by mouth daily.   Yes [provider]  Multiple Vitamin (MULTI-VITAMIN PO) Take 1 tablet by mouth daily.   Yes [provider]  oxycodone (OXY-IR) 5 MG capsule Take 1-2 capsules (  5-10 mg total) by mouth every 4 (four) hours as needed. 01/14/19  Yes Ardis Hughs, MD  potassium chloride (K-DUR) 10 MEQ tablet Take 1 tablet (10 mEq total) by mouth 3 (three) times daily. 06/22/18  Yes Love, Ivan Anchors, PA-C  simvastatin (ZOCOR) 40 MG tablet Take 0.5 tablets (20 mg total) by mouth at bedtime. Patient taking differently: Take 40 mg by mouth at bedtime.  06/22/18  Yes Love, Ivan Anchors, PA-C  vitamin B-12 (CYANOCOBALAMIN) 1000 MCG tablet Take 1 tablet (1,000 mcg total) by mouth daily. 06/22/18  Yes Love, Ivan Anchors, PA-C  Insulin Glargine (LANTUS) 100 UNIT/ML Solostar Pen Inject 20 Units into the skin daily. Patient not taking: Reported on 01/13/2019 06/22/18   Love, Ivan Anchors, PA-C  Insulin Pen Needle 31G X 6 MM MISC 1 application by Does not apply route daily. Patient not taking: Reported on 01/04/2019 06/22/18   Bary Leriche, PA-C   Liver Function Tests Recent Labs  Lab 01/16/19 2241  AST 14*  ALT 5  ALKPHOS 89  BILITOT 0.7  PROT 6.7  ALBUMIN 3.2*   Recent Labs  Lab 01/16/19 2241  LIPASE 26   CBC Recent Labs  Lab 01/13/19 1439 01/16/19 2241  WBC 7.9 10.6*  NEUTROABS  --  8.2*  HGB 8.6* 8.2*  HCT 27.2* 26.7*  MCV 95.1 98.5  PLT 246 921   Basic Metabolic Panel Recent Labs  Lab 01/13/19 1439 01/16/19 2241   NA 136 135  K 3.5 3.9  CL 100 102  CO2 23 19*  GLUCOSE 114* 183*  BUN 94* 99*  CREATININE 5.34* 6.00*  CALCIUM 10.1 9.6   Iron/TIBC/Ferritin/ %Sat    Component Value Date/Time   IRON 91 05/13/2018 0427   TIBC 304 05/13/2018 0427   FERRITIN 282 (H) 07/02/2018 1409   IRONPCTSAT 30 05/13/2018 0427    Vitals:   01/17/19 1623 01/17/19 1809 01/17/19 1817 01/17/19 2043  BP: 118/69  (!) 141/81 121/69  Pulse: (!) 117 (!) 120 (!) 120 (!) 111  Resp: (!) 22  (!) 22 (!) 21  Temp: 98.4 F (36.9 C)  99.4 F (37.4 C) (!) 101.8 F (38.8 C)  TempSrc: Oral  Oral Oral  SpO2: 92% 93% 91% 93%   Exam Gen elderly WF, slightly dry mouth, lethargic but arouses to voice and answers some questions properly No rash, cyanosis or gangrene Sclera anicteric, throat clear and slighlty dry  No jvd or bruits, flat neck veins Chest clear bilat to bases RRR no MRG Abd soft ntnd no mass or ascites +bs GU defer MS no joint effusions or deformity Ext trace bilat LE edema, no wounds or ulcers Neuro is alert, Ox 2, nonfocal   Date  Creat  eGFR  04/2018 5.3 > 1.6 AKI  05/2018  2.2 > 1.5  AKI  06/2018 2.1- 2.5  07/2018 2.6  17 ml/min  08/2018 2.8  09/2018 2.8  12/21/18 3.2  01/13/19 5.3  7 ml/min  01/16/19 6.0  6 ml/min   Home meds:  - furosemide 160 mg qam  - simvastatin 40 hs  - insulin lantus 20u qd  - oxycodone 74m prn/ diazepam 144mhs  - allopurinol 50 qd/ famotidine 10 qd/ levothyroxine 75 ug/ Kdur 10 tid  - prn's/ supplements/ vitamins     Assessment/ Plan: 1. Acute renal failure w/ baseline CKD IV: baseline creat 2.5- 2.8 in 2019. Now after procedure / lithotripsy/ R ureteral stent pt's creat is 6 and she looks vol depleted  and confused, prob early uremia.  Needs IVF"s and IV abx and follow renal function closely, hold home lasix. Foley in place. Low threshold for HD if needed. Does not have perm access. Have d/w family.   2. CKD IV - as above 3. SP recent lithotripsy R renal stone  (nonobstructing) , bladder fulguration, R ureteral stent: need to check renal US.  4. Fever - suspected UTI, on IV abx 5. Confusion - due to #1/ #4 6. DM2 7. Gout    [facility] 01/17/2019, 9:12 PM

## 2019-01-17 NOTE — Consult Note (Signed)
Urology Consult Note   Requesting Attending Physician:  Albertine Patricia, MD Service Providing Consult: Urology  Consulting Attending: Irine Seal, MD   Reason for Consult: UTI  HPI: Erika Fry is seen in consultation for reasons noted above at the request of [name], [name], MD.  This is a 73 y.o. female with past medical history as detailed below including hematuria in the setting of severe chronic pelvic pain, refractory to physical therapy. She also has had some worsening renal function. She is s/p cystoscopic hydrodistension with bladder biopsy, bilateral retrograde pyelogram and right ureteroscopic stone extraction with stent placement on 2/13. Patient was hard to arouse and somnolent, postoperatively, so she was admitted for observation and discharged home on 2/14.  Patient returned to the ED on 2/15 with confusion/somnolense. She was noted with temperature of 101F. She was given IVFs and started on antibiotics with clinical improvement. She was transferred to [facility] in the event she may need dialysis, given poor renal function at baseline.    Past Medical History: Past Medical History:  Diagnosis Date  . Arthritis    "knees, thumbs" (03/25/2018)  . CKD (chronic kidney disease), stage IV ([place])   . Diet-controlled diabetes mellitus ([place])   . GERD (gastroesophageal reflux disease)   . Gout    "on daily RX" (03/25/2018)  . Heart murmur   . High cholesterol   . Hypertension   . Hypothyroidism   . Iron deficiency anemia    "had to get an iron infusion"  . Migraine    "used to have them growing up" (03/25/2018)  . Presence of permanent cardiac pacemaker 03/25/2018    Past Surgical History:  Past Surgical History:  Procedure Laterality Date  . APPENDECTOMY    . BIOPSY  05/19/2018   Procedure: BIOPSY;  Surgeon: Ladene Artist, MD;  Location: [place];  Service: Endoscopy;;  . Consuela Mimes W/ RETROGRADES Bilateral 01/14/2019   Procedure: CYSTOSCOPY WITH RETROGRADE  PYELOGRAM BILATERAL HYDRODISTENTION;  Surgeon: Ardis Hughs, MD;  [facility];  Service: Urology;  Laterality: Bilateral;  . ESOPHAGOGASTRODUODENOSCOPY N/A 05/19/2018   Procedure: ESOPHAGOGASTRODUODENOSCOPY (EGD);  Surgeon: Ladene Artist, MD;  Location: [facility] ENDOSCOPY;  Service: Endoscopy;  Laterality: N/A;  . INSERT / REPLACE / REMOVE PACEMAKER  03/25/2018  . IR FLUORO GUIDE CV LINE RIGHT  05/18/2018  . IR LUMBAR DISC ASPIRATION W/IMG GUIDE  05/15/2018  . IR REMOVAL TUN CV CATH W/O FL  07/23/2018  . IR US GUIDE VASC ACCESS RIGHT  05/18/2018  . KNEE ARTHROSCOPY Bilateral   . PACEMAKER IMPLANT N/A 03/25/2018   Procedure: PACEMAKER IMPLANT;  Surgeon: Evans Lance, MD;  Location: [place] CV LAB;  Service: Cardiovascular;  Laterality: N/A;  . PACEMAKER PLACEMENT Left 03/2018  . RIGHT HEART CATH N/A 03/20/2018   Procedure: RIGHT HEART CATH;  Surgeon: Nigel Mormon, MD;  Location: [place] [facility];  Service: Cardiovascular;  Laterality: N/A;  . TEE WITHOUT CARDIOVERSION N/A 03/20/2018   Procedure: TRANSESOPHAGEAL ECHOCARDIOGRAM (TEE);  Surgeon: Nigel Mormon, MD;  Location: [facility]COPY;  Service: Cardiovascular;  Laterality: N/A;  . TONSILLECTOMY    . URETEROSCOPY WITH HOLMIUM LASER LITHOTRIPSY Right 01/14/2019   Procedure: URETEROSCOPY WITH HOLMIUM LASER LITHOTRIPSY BLADDER BIOPSIES RIGHT STENT PLACEMENT;  Surgeon: Ardis Hughs, MD;  Location[facility];  Service: Urology;  Laterality: Right;    Medication: Current Facility-Administered Medications  Medication Dose Route Frequency Provider Last Rate Last Dose  . 0.9 %  sodium chloride infusion  Intravenous Continuous Phillips Grout, MD 100 mL/hr at 01/17/19 0327    . acetaminophen (TYLENOL) tablet 650 mg  650 mg Oral Q6H PRN Phillips Grout, MD       Or  . acetaminophen (TYLENOL) suppository 650 mg  650 mg Rectal Q6H PRN Phillips Grout, MD      . cefTRIAXone (ROCEPHIN) 1 g in sodium chloride 0.9 % 100 mL IVPB   1 g Intravenous Q24H Derrill Kay A, MD      . famotidine (PEPCID) tablet 10 mg  10 mg Oral Daily Derrill Kay A, MD      . ferrous sulfate tablet 325 mg  325 mg Oral BID WC Derrill Kay A, MD      . fesoterodine (TOVIAZ) tablet 4 mg  4 mg Oral QHS Derrill Kay A, MD   4 mg at 01/17/19 0116  . fluticasone (FLONASE) 50 MCG/ACT nasal spray 2 spray  2 spray Each Nare Daily PRN Phillips Grout, MD      . folic acid (FOLVITE) tablet 1 mg  1 mg Oral Daily Derrill Kay A, MD      . levothyroxine (SYNTHROID, LEVOTHROID) tablet 75 mcg  75 mcg Oral QAC breakfast Phillips Grout, MD   75 mcg at 01/17/19 7209  . lidocaine (XYLOCAINE) 5 % ointment 1 application  1 application Topical PRN Phillips Grout, MD      . mirabegron ER (MYRBETRIQ) tablet 50 mg  50 mg Oral Daily Derrill Kay A, MD      . vitamin B-12 (CYANOCOBALAMIN) tablet 1,000 mcg  1,000 mcg Oral Daily Phillips Grout, MD        Allergies: Allergies  Allergen Reactions  . Baclofen Other (See Comments)    somnolence  . Codeine Other (See Comments)    Increases Pain and couldn't sleep    Social History: Social History   Tobacco Use  . Smoking status: Never Smoker  . Smokeless tobacco: Never Used  Substance Use Topics  . Alcohol use: Not Currently  . Drug use: Never    Family History Family History  Problem Relation Age of Onset  . Hypertension Mother   . Diabetes Mellitus II Father   . Heart disease Father   . Gastric cancer Brother   . Diabetes Mellitus II Brother     Review of Systems 10 systems were reviewed and are negative except as noted specifically in the HPI.  Objective   Vital signs in last 24 hours: BP 124/73 (BP Location: Right Arm)   Pulse 96   Temp 98 F (36.7 C) (Oral)   Resp (!) 22   SpO2 96%   Physical Exam General: NAD, A&O, resting, appropriate HEENT: [place]/AT, EOMI, MMM Pulmonary: Normal work of breathing Cardiovascular: HDS, adequate peripheral perfusion Abdomen: Soft, non-distended,  mildly tender GU: Purewick catheter draining clear yellow urine Extremities: warm and well perfused Neuro: Appropriate, no focal neurological deficits  Most Recent Labs: Lab Results  Component Value Date   WBC 10.6 (H) 01/16/2019   HGB 8.2 (L) 01/16/2019   HCT 26.7 (L) 01/16/2019   PLT 173 01/16/2019    Lab Results  Component Value Date   NA 135 01/16/2019   K 3.9 01/16/2019   CL 102 01/16/2019   CO2 19 (L) 01/16/2019   BUN 99 (H) 01/16/2019   CREATININE 6.00 (H) 01/16/2019   CALCIUM 9.6 01/16/2019   MG 2.1 05/18/2018   PHOS 4.0 06/22/2018    Lab Results  Component  Value Date   INR 1.01 05/14/2018   APTT 25 05/13/2018     IMAGING: Dg Chest 1 View  Result Date: 01/16/2019 CLINICAL DATA:  Cystoscopy yesterday. Right upper quadrant pain with weakness and fatigue. Chronic kidney disease. EXAM: CHEST  1 VIEW COMPARISON:  06/15/2018 and 06/10/2018 FINDINGS: Patient slightly rotated to the left. Left-sided pacemaker unchanged. Lungs are adequately inflated with mild hazy density over the lateral left base which may be due in part to patient rotation although small left effusion with associated atelectasis is possible. Mild stable cardiomegaly. Remainder of the exam is unchanged. IMPRESSION: Hazy density over the lateral left base likely partially due to patient rotation although small effusion with atelectasis is possible. Mild stable cardiomegaly. Electronically Signed   By: Marin Olp M.D.   On: 01/16/2019 21:49   Ct Head Wo Contrast  Result Date: 01/16/2019 CLINICAL DATA:  Encephalopathy. EXAM: CT HEAD WITHOUT CONTRAST TECHNIQUE: Contiguous axial images were obtained from the base of the skull through the vertex without intravenous contrast. COMPARISON:  05/28/2018 FINDINGS: Brain: Ventricles, cisterns and other CSF spaces are within normal. There is no mass, mass effect, shift of midline structures or acute hemorrhage. No evidence of acute infarction. Vascular: No hyperdense  vessel or unexpected calcification. Skull: Normal. Negative for fracture or focal lesion. Sinuses/Orbits: No acute finding. Other: None. IMPRESSION: No acute findings. Electronically Signed   By: Marin Olp M.D.   On: 01/16/2019 22:31    ------  Assessment:  73 y.o. female presenting with dehydration, acute on chronic kidney disease and likely UTI following cystoscopic hydrodistension, bladder biopsy and right ureteroscopic stone extraction with stent placement on 2/13.   Recommendations: - Agree with conservative management with IVFs and antibiotics pending culture results. - Continue to trend urine output and Cr. - If patient does not continue to improve clinically, consider KUB to ensure appropriate positioning of right ureteral stent.   Thank you for this consult. Please contact the urology consult pager with any further questions/concerns.

## 2019-01-18 ENCOUNTER — Inpatient Hospital Stay (HOSPITAL_COMMUNITY): Payer: Medicare Other

## 2019-01-18 DIAGNOSIS — I10 Essential (primary) hypertension: Secondary | ICD-10-CM

## 2019-01-18 LAB — BASIC METABOLIC PANEL
Anion gap: 13 (ref 5–15)
BUN: 77 mg/dL — ABNORMAL HIGH (ref 8–23)
CO2: 17 mmol/L — ABNORMAL LOW (ref 22–32)
Calcium: 8.7 mg/dL — ABNORMAL LOW (ref 8.9–10.3)
Chloride: 109 mmol/L (ref 98–111)
Creatinine, Ser: 4.37 mg/dL — ABNORMAL HIGH (ref 0.44–1.00)
GFR calc Af Amer: 11 mL/min — ABNORMAL LOW (ref >60)
GFR calc non Af Amer: 9 mL/min — ABNORMAL LOW (ref >60)
Glucose, Bld: 120 mg/dL — ABNORMAL HIGH (ref 70–99)
Potassium: 3.4 mmol/L — ABNORMAL LOW (ref 3.5–5.1)
Sodium: 139 mmol/L (ref 135–145)

## 2019-01-18 LAB — CBC
HCT: 23.5 % — ABNORMAL LOW (ref 36.0–46.0)
Hemoglobin: 7.5 g/dL — ABNORMAL LOW (ref 12.0–15.0)
MCH: 29.9 pg (ref 26.0–34.0)
MCHC: 31.9 g/dL (ref 30.0–36.0)
MCV: 93.6 fL (ref 80.0–100.0)
NRBC: 0 % (ref 0.0–0.2)
Platelets: 164 10*3/uL (ref 150–400)
RBC: 2.51 MIL/uL — ABNORMAL LOW (ref 3.87–5.11)
RDW: 13.6 % (ref 11.5–15.5)
WBC: 7.5 10*3/uL (ref 4.0–10.5)

## 2019-01-18 LAB — URINE CULTURE: Culture: NO GROWTH

## 2019-01-18 MED ORDER — LACTATED RINGERS IV SOLN
INTRAVENOUS | Status: DC
  Administered 2019-01-18 – 2019-01-24 (×11): via INTRAVENOUS

## 2019-01-18 NOTE — Progress Notes (Signed)
Urology Inpatient Progress Report  Dehydration [E86.0] Confusion [R41.0] Fever, unspecified fever cause [R50.9] Acute renal failure superimposed on stage 5 chronic kidney disease, not on chronic dialysis, unspecified acute renal failure type ([place]) [N17.9, N18.5]     Intv/Subj: No acute events overnight. Continues to have pelvic pain, feels sleepy and incredibly thirsty.   Hasn't walked this admission.  Principal Problem:   Fever Active Problems:   Aortic stenosis   CKD (chronic kidney disease), stage IV (HCC)   Anemia of chronic disease   Benign essential HTN   Acute lower UTI   Pressure injury of skin  Current Facility-Administered Medications  Medication Dose Route Frequency Provider Last Rate Last Dose  . 0.9 %  sodium chloride infusion   Intravenous Continuous [name], [name], MD 75 mL/hr at 01/17/19 2221    . acetaminophen (TYLENOL) tablet 650 mg  650 mg Oral Q6H PRN Phillips Grout, MD   650 mg at 01/17/19 2138   Or  . acetaminophen (TYLENOL) suppository 650 mg  650 mg Rectal Q6H PRN Phillips Grout, MD      . cefTRIAXone (ROCEPHIN) 1 g in sodium chloride 0.9 % 100 mL IVPB  1 g Intravenous Q24H Derrill Kay A, MD 200 mL/hr at 01/17/19 2135 1 g at 01/17/19 2135  . famotidine (PEPCID) tablet 10 mg  10 mg Oral Daily Derrill Kay A, MD   10 mg at 01/18/19 0800  . ferrous sulfate tablet 325 mg  325 mg Oral BID WC Phillips Grout, MD   325 mg at 01/18/19 0801  . fesoterodine (TOVIAZ) tablet 4 mg  4 mg Oral QHS Derrill Kay A, MD   4 mg at 01/17/19 2139  . fluticasone (FLONASE) 50 MCG/ACT nasal spray 2 spray  2 spray Each Nare Daily PRN Phillips Grout, MD      . folic acid (FOLVITE) tablet 1 mg  1 mg Oral Daily Derrill Kay A, MD   1 mg at 01/18/19 0801  . heparin injection 5,000 Units  5,000 Units Subcutaneous Q8H [name], [name], MD   5,000 Units at 01/18/19 [id number]  . levothyroxine (SYNTHROID, LEVOTHROID) tablet 75 mcg  75 mcg Oral QAC breakfast Phillips Grout, MD    75 mcg at 01/18/19 4627  . lidocaine (XYLOCAINE) 5 % ointment 1 application  1 application Topical PRN Phillips Grout, MD      . mirabegron ER (MYRBETRIQ) tablet 50 mg  50 mg Oral Daily Derrill Kay A, MD   50 mg at 01/18/19 0801  . vitamin B-12 (CYANOCOBALAMIN) tablet 1,000 mcg  1,000 mcg Oral Daily Derrill Kay A, MD   1,000 mcg at 01/18/19 0801     Objective: Vital: Vitals:   01/18/19 0300 01/18/19 0400 01/18/19 0500 01/18/19 0815  BP: (!) 98/58 (!) 94/59 96/61 92/63   Pulse: 94 94 (!) 101 96  Resp:    18  Temp:    97.7 F (36.5 C)  TempSrc:    Oral  SpO2:    98%  Weight:       I/Os: I/O last 3 completed shifts: In: 2774 [P.O.:150; I.V.:2074; IV Piggyback:550] Out: 5200 [Urine:5200]  Physical Exam:  General: Patient is in no apparent distress Lungs: Normal respiratory effort, chest expands symmetrically. GI: abdomen is soft and nontender without mass. Foley: clear yellow urine Ext: lower extremities symmetric  Lab Results: Recent Labs    01/16/19 2241 01/18/19 0416  WBC 10.6* 7.5  HGB 8.2* 7.5*  HCT 26.7* 23.5*  Recent Labs    01/16/19 2241 01/18/19 0416  NA 135 139  K 3.9 3.4*  CL 102 109  CO2 19* 17*  GLUCOSE 183* 120*  BUN 99* 77*  CREATININE 6.00* 4.37*  CALCIUM 9.6 8.7*   No results for input(s): LABPT, INR in the last 72 hours. No results for input(s): LABURIN in the last 72 hours. Results for orders placed or performed during the hospital encounter of 01/16/19  Urine culture     Status: None   Collection Time: 01/16/19  9:51 PM  Result Value Ref Range Status   Specimen Description   Final    URINE, RANDOM Performed at [place] [place] [address], [place], [place] [address]    Special Requests   Final    NONE Performed at [facility], [place] [address], [place], [place] [address]    Culture   Final    NO GROWTH Performed at [facility], [place] [address], [place], [place] [address]    Report  Status 01/18/2019 FINAL  Final  Respiratory Panel by PCR     Status: None   Collection Time: 01/17/19 12:23 AM  Result Value Ref Range Status   Adenovirus NOT DETECTED NOT DETECTED Final   Coronavirus 229E NOT DETECTED NOT DETECTED Final    Comment: (NOTE) The Coronavirus on the Respiratory Panel, DOES NOT test for the novel  Coronavirus (2019 nCoV)    Coronavirus HKU1 NOT DETECTED NOT DETECTED Final   Coronavirus NL63 NOT DETECTED NOT DETECTED Final   Coronavirus OC43 NOT DETECTED NOT DETECTED Final   Metapneumovirus NOT DETECTED NOT DETECTED Final   Rhinovirus / Enterovirus NOT DETECTED NOT DETECTED Final   Influenza A NOT DETECTED NOT DETECTED Final   Influenza B NOT DETECTED NOT DETECTED Final   Parainfluenza Virus 1 NOT DETECTED NOT DETECTED Final   Parainfluenza Virus 2 NOT DETECTED NOT DETECTED Final   Parainfluenza Virus 3 NOT DETECTED NOT DETECTED Final   Parainfluenza Virus 4 NOT DETECTED NOT DETECTED Final   Respiratory Syncytial Virus NOT DETECTED NOT DETECTED Final   Bordetella pertussis NOT DETECTED NOT DETECTED Final   Chlamydophila pneumoniae NOT DETECTED NOT DETECTED Final   Mycoplasma pneumoniae NOT DETECTED NOT DETECTED Final    Comment: Performed at [facility], [place] [address] [place], [place] [address]    Studies/Results: Dg Chest 1 View  Result Date: 01/16/2019 CLINICAL DATA:  Cystoscopy yesterday. Right upper quadrant pain with weakness and fatigue. Chronic kidney disease. EXAM: CHEST  1 VIEW COMPARISON:  06/15/2018 and 06/10/2018 FINDINGS: Patient slightly rotated to the left. Left-sided pacemaker unchanged. Lungs are adequately inflated with mild hazy density over the lateral left base which may be due in part to patient rotation although small left effusion with associated atelectasis is possible. Mild stable cardiomegaly. Remainder of the exam is unchanged. IMPRESSION: Hazy density over the lateral left base likely partially due to patient rotation  although small effusion with atelectasis is possible. Mild stable cardiomegaly. Electronically Signed   By: Marin Olp M.D.   On: 01/16/2019 21:49   Ct Head Wo Contrast  Result Date: 01/16/2019 CLINICAL DATA:  Encephalopathy. EXAM: CT HEAD WITHOUT CONTRAST TECHNIQUE: Contiguous axial images were obtained from the base of the skull through the vertex without intravenous contrast. COMPARISON:  05/28/2018 FINDINGS: Brain: Ventricles, cisterns and other CSF spaces are within normal. There is no mass, mass effect, shift of midline structures or acute hemorrhage. No evidence of acute infarction.  Vascular: No hyperdense vessel or unexpected calcification. Skull: Normal. Negative for fracture or focal lesion. Sinuses/Orbits: No acute finding. Other: None. IMPRESSION: No acute findings. Electronically Signed   By: Marin Olp M.D.   On: 01/16/2019 22:31   US Renal  Result Date: 01/18/2019 CLINICAL DATA:  Acute renal injury EXAM: RENAL / URINARY TRACT ULTRASOUND COMPLETE COMPARISON:  CT AP 05/13/2018 FINDINGS: Right Kidney: Renal measurements: 11.6 x 6.5 x 6.1 cm = volume: 237 mL. Increased echogenicity. No obstructive uropathy or mass. Left Kidney: Renal measurements: 9.6 x 4.8 x 4.5 cm = volume: 108 mL. Increased echogenicity. No obstructive uropathy or mass. Bladder: Decompressed by Foley catheter. IMPRESSION: 1. Increased renal echogenicity which can be seen in medical renal disease. 2. No obstructive uropathy or renal mass. 3. Decompressed urinary bladder with Foley. Electronically Signed   By: Ashley Royalty M.D.   On: 01/18/2019 03:06    Assessment: Seems to be improving, with continued chronic pelvic pain.  Her urinary tract symptoms are made worse by the stent that she currently has in her right collecting system.  Her renal u/s shows no hydro.  Suspect she was dehydrated leading to admission.  Biospies from the OR were negative for CIS or malignancy.  Plan: Continued rehydration ? Abx, would  continue for a total of 7 days if cultures are positive. Encourage ambulation. D/c foley prior to discharge.  Will f/u with patient in clinic as scheduled.   Louis Meckel, MD Urology 01/18/2019, 12:05 PM

## 2019-01-18 NOTE — Progress Notes (Addendum)
PROGRESS NOTE  Erika Fry  MEQ:[id number] DOB: Mar 07, 1946 DOA: 01/16/2019 PCP: [name], MD   Brief Narrative: 73 y.o.femalewith medical history significantfor stage IV CKD, hypertension, diabetes with recent discharge from urology service who presented for poor per oral intake, pelvic pain, lethargy. Work up demonstrated BUN of 99, and creatinine of 6, with pyuria on UA and dysuria. Ceftriaxone was started and nephrology was consulted. IV fluids have been given with some improvement in creatinine thus far.  Assessment & Plan: Principal Problem:   Fever Active Problems:   Aortic stenosis   CKD (chronic kidney disease), stage IV (HCC)   Anemia of chronic disease   Benign essential HTN   Acute lower UTI   Pressure injury of skin  Fever due to UTI: With strongly positive UA and typical symptoms will continue treating despite negative urine culture. ?if previous abx caused false negative Cx.  - Continue ceftriaxone   AKI on stage IV CKD:  - BUN, creatinine improving with IVF's, UOP is good. Changing NS to LR per nephrology. - Avoid nephrotoxins  NAGMA: Changing to LR, will recheck BMP in AM.   Acute embolic encephalopathy: Possibly due to uremia vs. UTI.  - Improving.   Anemia of chronic disease:  - Hgb down to 7.5. May need ESA, defer to nephrology.  - Monitor for bleeding, check CBC in AM. Transfusion threshold probably 7g/dl.  Benign essential HTN:  - Continue home medications  Stage I pressure injury bilateral buttocks. Not known POA status.  - Offload as able.  Obesity: BMI 32 - Weight loss long term recommended.  DVT prophylaxis: SCDs Code Status: Full Family Communication: None at bedside this AM. Disposition Plan: Home most likely once improving. PT/OT consulted.   Consultants:   Nephrology  Urology  Procedures:   Foley catheter  Antimicrobials:  Ceftriaxone   Subjective: Feels very weak and thirsty this morning. Has not gotten  up since arrival. Making plenty of urine. Has severe right greater than left pelvic pain which was present PTA.  Objective: Vitals:   01/18/19 0400 01/18/19 0500 01/18/19 0815 01/18/19 1338  BP: (!) 94/59 96/61 92/63    Pulse: 94 (!) 101 96   Resp:   18   Temp:   97.7 F (36.5 C) 99.5 F (37.5 C)  TempSrc:   Oral Oral  SpO2:   98%   Weight:        Intake/Output Summary (Last 24 hours) at 01/18/2019 1402 Last data filed at 01/18/2019 1247 Gross per 24 hour  Intake 2095.35 ml  Output 5250 ml  Net -3154.65 ml   Filed Weights   01/18/19 0200  Weight: 88.9 kg    Gen: 73 y.o. female in no distress  Pulm: Non-labored breathing. Clear to auscultation bilaterally.  CV: Regular rate and rhythm. No murmur, rub, or gallop. No JVD, no pedal edema. GI: Abdomen soft, +suprapubic tenderness, otherwise non-tender, non-distended, with normoactive bowel sounds. No organomegaly or masses felt. Ext: Warm, no deformities Skin: No rashes, lesions or ulcers Neuro: Alert and oriented. No focal neurological deficits. Psych: Judgement and insight appear normal. Mood & affect appropriate.   Data Reviewed: I have personally reviewed following labs and imaging studies  CBC: Recent Labs  Lab 01/13/19 1439 01/16/19 2241 01/18/19 0416  WBC 7.9 10.6* 7.5  NEUTROABS  --  8.2*  --   HGB 8.6* 8.2* 7.5*  HCT 27.2* 26.7* 23.5*  MCV 95.1 98.5 93.6  PLT 246 173 297   Basic Metabolic Panel: Recent Labs  Lab 01/13/19 1439 01/16/19 2241 01/18/19 0416  NA 136 135 139  K 3.5 3.9 3.4*  CL 100 102 109  CO2 23 19* 17*  GLUCOSE 114* 183* 120*  BUN 94* 99* 77*  CREATININE 5.34* 6.00* 4.37*  CALCIUM 10.1 9.6 8.7*   GFR: Estimated Creatinine Clearance: 12.4 mL/min (A) (by C-G formula based on SCr of 4.37 mg/dL (H)). Liver Function Tests: Recent Labs  Lab 01/16/19 2241  AST 14*  ALT 5  ALKPHOS 89  BILITOT 0.7  PROT 6.7  ALBUMIN 3.2*   Recent Labs  Lab 01/16/19 2241  LIPASE 26   No results  for input(s): AMMONIA in the last 168 hours. Coagulation Profile: No results for input(s): INR, PROTIME in the last 168 hours. Cardiac Enzymes: No results for input(s): CKTOTAL, CKMB, CKMBINDEX, TROPONINI in the last 168 hours. BNP (last 3 results) No results for input(s): PROBNP in the last 8760 hours. HbA1C: No results for input(s): HGBA1C in the last 72 hours. CBG: Recent Labs  Lab 01/13/19 1347 01/14/19 0559 01/14/19 1023 01/16/19 2334  GLUCAP 115* 110* 150* 162*   Lipid Profile: No results for input(s): CHOL, HDL, LDLCALC, TRIG, CHOLHDL, LDLDIRECT in the last 72 hours. Thyroid Function Tests: No results for input(s): TSH, T4TOTAL, FREET4, T3FREE, THYROIDAB in the last 72 hours. Anemia Panel: No results for input(s): VITAMINB12, FOLATE, FERRITIN, TIBC, IRON, RETICCTPCT in the last 72 hours. Urine analysis:    Component Value Date/Time   COLORURINE YELLOW 01/16/2019 2151   APPEARANCEUR CLOUDY (A) 01/16/2019 2151   LABSPEC 1.012 01/16/2019 2151   PHURINE 6.0 01/16/2019 2151   GLUCOSEU 50 (A) 01/16/2019 2151   HGBUR MODERATE (A) 01/16/2019 2151   BILIRUBINUR NEGATIVE 01/16/2019 2151   [place] NEGATIVE 01/16/2019 2151   PROTEINUR 100 (A) 01/16/2019 2151   NITRITE NEGATIVE 01/16/2019 2151   LEUKOCYTESUR LARGE (A) 01/16/2019 2151   Recent Results (from the past 240 hour(s))  Urine culture     Status: None   Collection Time: 01/16/19  9:51 PM  Result Value Ref Range Status   Specimen Description   Final    URINE, RANDOM Performed at [facility], [place] [address] [place], [place] [address]    Special Requests   Final    NONE Performed at [facility], [place] [address] [place], [place] [address]    Culture   Final    NO GROWTH Performed at [facility], [place] [address] [place], [place] [address]    Report Status 01/18/2019 FINAL  Final  Culture, blood (routine x 2)     Status: None (Preliminary result)   Collection Time:  01/16/19 10:41 PM  Result Value Ref Range Status   Specimen Description   Final    BLOOD LEFT ANTECUBITAL Performed at [place] [address] [place], [place] [address]    Special Requests   Final    BOTTLES DRAWN AEROBIC AND ANAEROBIC Blood Culture adequate volume Performed at [place] [address], [place], [place] [address]    Culture   Final    NO GROWTH 1 DAY Performed at [facility], [place] [address], [place], [place] [address]    Report Status PENDING  Incomplete  Respiratory Panel by PCR     Status: None   Collection Time: 01/17/19 12:23 AM  Result Value Ref Range Status   Adenovirus NOT DETECTED NOT DETECTED Final   Coronavirus 229E NOT DETECTED NOT DETECTED Final  Comment: (NOTE) The Coronavirus on the Respiratory Panel, DOES NOT test for the novel  Coronavirus (2019 nCoV)    Coronavirus HKU1 NOT DETECTED NOT DETECTED Final   Coronavirus NL63 NOT DETECTED NOT DETECTED Final   Coronavirus OC43 NOT DETECTED NOT DETECTED Final   Metapneumovirus NOT DETECTED NOT DETECTED Final   Rhinovirus / Enterovirus NOT DETECTED NOT DETECTED Final   Influenza A NOT DETECTED NOT DETECTED Final   Influenza B NOT DETECTED NOT DETECTED Final   Parainfluenza Virus 1 NOT DETECTED NOT DETECTED Final   Parainfluenza Virus 2 NOT DETECTED NOT DETECTED Final   Parainfluenza Virus 3 NOT DETECTED NOT DETECTED Final   Parainfluenza Virus 4 NOT DETECTED NOT DETECTED Final   Respiratory Syncytial Virus NOT DETECTED NOT DETECTED Final   Bordetella pertussis NOT DETECTED NOT DETECTED Final   Chlamydophila pneumoniae NOT DETECTED NOT DETECTED Final   Mycoplasma pneumoniae NOT DETECTED NOT DETECTED Final    Comment: Performed at [facility], [place] [address], [place], [place] [address]      Radiology Studies: Dg Chest 1 View  Result Date: 01/16/2019 CLINICAL DATA:  Cystoscopy yesterday. Right upper quadrant pain with weakness and  fatigue. Chronic kidney disease. EXAM: CHEST  1 VIEW COMPARISON:  06/15/2018 and 06/10/2018 FINDINGS: Patient slightly rotated to the left. Left-sided pacemaker unchanged. Lungs are adequately inflated with mild hazy density over the lateral left base which may be due in part to patient rotation although small left effusion with associated atelectasis is possible. Mild stable cardiomegaly. Remainder of the exam is unchanged. IMPRESSION: Hazy density over the lateral left base likely partially due to patient rotation although small effusion with atelectasis is possible. Mild stable cardiomegaly. Electronically Signed   By: Marin Olp M.D.   On: 01/16/2019 21:49   Ct Head Wo Contrast  Result Date: 01/16/2019 CLINICAL DATA:  Encephalopathy. EXAM: CT HEAD WITHOUT CONTRAST TECHNIQUE: Contiguous axial images were obtained from the base of the skull through the vertex without intravenous contrast. COMPARISON:  05/28/2018 FINDINGS: Brain: Ventricles, cisterns and other CSF spaces are within normal. There is no mass, mass effect, shift of midline structures or acute hemorrhage. No evidence of acute infarction. Vascular: No hyperdense vessel or unexpected calcification. Skull: Normal. Negative for fracture or focal lesion. Sinuses/Orbits: No acute finding. Other: None. IMPRESSION: No acute findings. Electronically Signed   By: Marin Olp M.D.   On: 01/16/2019 22:31   US Renal  Result Date: 01/18/2019 CLINICAL DATA:  Acute renal injury EXAM: RENAL / URINARY TRACT ULTRASOUND COMPLETE COMPARISON:  CT AP 05/13/2018 FINDINGS: Right Kidney: Renal measurements: 11.6 x 6.5 x 6.1 cm = volume: 237 mL. Increased echogenicity. No obstructive uropathy or mass. Left Kidney: Renal measurements: 9.6 x 4.8 x 4.5 cm = volume: 108 mL. Increased echogenicity. No obstructive uropathy or mass. Bladder: Decompressed by Foley catheter. IMPRESSION: 1. Increased renal echogenicity which can be seen in medical renal disease. 2. No  obstructive uropathy or renal mass. 3. Decompressed urinary bladder with Foley. Electronically Signed   By: Ashley Royalty M.D.   On: 01/18/2019 03:06    Scheduled Meds: . famotidine  10 mg Oral Daily  . ferrous sulfate  325 mg Oral BID WC  . fesoterodine  4 mg Oral QHS  . folic acid  1 mg Oral Daily  . heparin injection (subcutaneous)  5,000 Units Subcutaneous Q8H  . levothyroxine  75 mcg Oral QAC breakfast  . mirabegron ER  50 mg Oral Daily  . vitamin B-12  1,000 mcg Oral Daily   Continuous Infusions: . cefTRIAXone (ROCEPHIN)  IV 1 g (01/17/19 2135)  . lactated ringers 100 mL/hr at 01/18/19 1243     LOS: 1 day   Time spent: 25 minutes.  Patrecia Pour, MD [facility] [email or web address] Password [facility] 01/18/2019, 2:02 PM

## 2019-01-18 NOTE — Progress Notes (Signed)
6Admit: 01/16/2019 LOS: 1  26F with AoCKD4 (BL SCr 2.5-3.5) after cystoscopy, hydrodistension, R kidney stone lithotripsy/extraction, R ureteral stent placement 2/13  Subjective:  . Serum creatinine improved to 4.37 from 6.0.  BUN improved similarly.  Potassium 3.4.  Bicarbonate 17 with anion gap 13. . Afebrile.  Blood pressure soft, stable.  On room air. . 6.4 L urine output since admission. . On ceftriaxone, urine culture no growth . Receiving normal saline at 75 mL's per hour.  02/16 0701 - 02/17 0700 In: 2005.4 [P.O.:150; I.V.:1855.4] Out: 5200 [Urine:5200]  Filed Weights   01/18/19 0200  Weight: 88.9 kg    Scheduled Meds: . famotidine  10 mg Oral Daily  . ferrous sulfate  325 mg Oral BID WC  . fesoterodine  4 mg Oral QHS  . folic acid  1 mg Oral Daily  . heparin injection (subcutaneous)  5,000 Units Subcutaneous Q8H  . levothyroxine  75 mcg Oral QAC breakfast  . mirabegron ER  50 mg Oral Daily  . vitamin B-12  1,000 mcg Oral Daily   Continuous Infusions: . sodium chloride 75 mL/hr at 01/17/19 2221  . cefTRIAXone (ROCEPHIN)  IV 1 g (01/17/19 2135)   PRN Meds:.acetaminophen **OR** acetaminophen, fluticasone, lidocaine  Current Labs: reviewed    Physical Exam:  Blood pressure 92/63, pulse 96, temperature 97.7 F (36.5 C), temperature source Oral, resp. rate 18, weight 88.9 kg, SpO2 98 %. NAD, elderly female lying in bed Regular, normal S1 and S2 CTA B No significant lower extremity edema Awake, alert, oriented; nonfocal  A 1. Acute on progressive chronic renal failure; appears recent baseline creatinine was progressing and was 5.3 prior to urological procedures; follows with Dr. Johnney Ou at [facility].  Working theory was dehydration but greater than 5 L of urine output from presentation are not consistent with this.  Nevertheless, renal function is showing improvement since admission.  Currently, no indication for dialysis 2. Chronic pelvic pain s/p cystoscopy,  hydrodistention, right ureteral stent placement, lithotripsy on 01/14/2018 3. Mild metabolic acidosis related to #1, normal saline IV fluids 4. Fever, cultures no growth to date, on ceftriaxone 5. Type 2 diabetes 6. Gout 7. Anemia, normocytic; chronic.  P . Change over to LR from NS. [name] Kitchen Continue to monitor closely; no indication for dialysis.  Pearson Grippe MD 01/18/2019, 12:26 PM  Recent Labs  Lab 01/13/19 1439 01/16/19 2241 01/18/19 0416  NA 136 135 139  K 3.5 3.9 3.4*  CL 100 102 109  CO2 23 19* 17*  GLUCOSE 114* 183* 120*  BUN 94* 99* 77*  CREATININE 5.34* 6.00* 4.37*  CALCIUM 10.1 9.6 8.7*   Recent Labs  Lab 01/13/19 1439 01/16/19 2241 01/18/19 0416  WBC 7.9 10.6* 7.5  NEUTROABS  --  8.2*  --   HGB 8.6* 8.2* 7.5*  HCT 27.2* 26.7* 23.5*  MCV 95.1 98.5 93.6  PLT 246 173 164

## 2019-01-18 NOTE — Significant Event (Signed)
Rapid Response Event Note  Overview: Neurologic -- Decreased LOC  Initial Focused Assessment: Called by RN about patient having increasing lethargy. Upon arrival, patient quickly arouse to voice, follows commands, endorses overall generalized aches, + very warm to touch, temp of 101.8, other VS stable. TRH NP came to bedside as well  Interventions: -- NS bolus 500cc -- APAP for fever and generalized pain   Plan of Care: -- Monitor VS and mental status.   Event Summary:    at    Call Time 2106 Arrival Time 2110 End Time 2130  Andrick Rust R

## 2019-01-19 ENCOUNTER — Inpatient Hospital Stay (HOSPITAL_COMMUNITY): Payer: Medicare Other

## 2019-01-19 DIAGNOSIS — Z8701 Personal history of pneumonia (recurrent): Secondary | ICD-10-CM

## 2019-01-19 DIAGNOSIS — Z8739 Personal history of other diseases of the musculoskeletal system and connective tissue: Secondary | ICD-10-CM

## 2019-01-19 DIAGNOSIS — Z885 Allergy status to narcotic agent status: Secondary | ICD-10-CM

## 2019-01-19 DIAGNOSIS — Z888 Allergy status to other drugs, medicaments and biological substances status: Secondary | ICD-10-CM

## 2019-01-19 DIAGNOSIS — R109 Unspecified abdominal pain: Secondary | ICD-10-CM

## 2019-01-19 DIAGNOSIS — N179 Acute kidney failure, unspecified: Secondary | ICD-10-CM

## 2019-01-19 DIAGNOSIS — N184 Chronic kidney disease, stage 4 (severe): Secondary | ICD-10-CM

## 2019-01-19 DIAGNOSIS — Z950 Presence of cardiac pacemaker: Secondary | ICD-10-CM

## 2019-01-19 DIAGNOSIS — R630 Anorexia: Secondary | ICD-10-CM

## 2019-01-19 DIAGNOSIS — Z87448 Personal history of other diseases of urinary system: Secondary | ICD-10-CM

## 2019-01-19 DIAGNOSIS — R509 Fever, unspecified: Secondary | ICD-10-CM

## 2019-01-19 DIAGNOSIS — R140 Abdominal distension (gaseous): Secondary | ICD-10-CM

## 2019-01-19 DIAGNOSIS — R410 Disorientation, unspecified: Secondary | ICD-10-CM

## 2019-01-19 LAB — CBC
HCT: 23 % — ABNORMAL LOW (ref 36.0–46.0)
Hemoglobin: 7.6 g/dL — ABNORMAL LOW (ref 12.0–15.0)
MCH: 30.2 pg (ref 26.0–34.0)
MCHC: 33 g/dL (ref 30.0–36.0)
MCV: 91.3 fL (ref 80.0–100.0)
Platelets: 174 10*3/uL (ref 150–400)
RBC: 2.52 MIL/uL — ABNORMAL LOW (ref 3.87–5.11)
RDW: 13.4 % (ref 11.5–15.5)
WBC: 6.5 10*3/uL (ref 4.0–10.5)
nRBC: 0 % (ref 0.0–0.2)

## 2019-01-19 LAB — RENAL FUNCTION PANEL
Albumin: 2.2 g/dL — ABNORMAL LOW (ref 3.5–5.0)
Anion gap: 14 (ref 5–15)
BUN: 58 mg/dL — ABNORMAL HIGH (ref 8–23)
CO2: 18 mmol/L — AB (ref 22–32)
Calcium: 9.1 mg/dL (ref 8.9–10.3)
Chloride: 102 mmol/L (ref 98–111)
Creatinine, Ser: 3.35 mg/dL — ABNORMAL HIGH (ref 0.44–1.00)
GFR calc Af Amer: 15 mL/min — ABNORMAL LOW (ref >60)
GFR calc non Af Amer: 13 mL/min — ABNORMAL LOW (ref >60)
Glucose, Bld: 152 mg/dL — ABNORMAL HIGH (ref 70–99)
Phosphorus: 3.7 mg/dL (ref 2.5–4.6)
Potassium: 3.2 mmol/L — ABNORMAL LOW (ref 3.5–5.1)
Sodium: 134 mmol/L — ABNORMAL LOW (ref 135–145)

## 2019-01-19 MED ORDER — OXYCODONE HCL 5 MG PO TABS
5.0000 mg | ORAL_TABLET | Freq: Once | ORAL | Status: AC
  Administered 2019-01-19: 5 mg via ORAL
  Filled 2019-01-19: qty 1

## 2019-01-19 NOTE — Progress Notes (Addendum)
PROGRESS NOTE  Erika Fry  WUJ:[id number] DOB: 1946/05/25 DOA: 01/16/2019 PCP: [name], MD   Brief Narrative: 73 y.o.femalewith medical history significantfor stage IV CKD, hypertension, diabetes, L4-L5 discitis, with recent discharge from urology service who presented for poor per oral intake, pelvic pain, lethargy. Work up demonstrated BUN of 99, and creatinine of 6, with pyuria on UA and dysuria. Ceftriaxone was started and nephrology was consulted. IV fluids have been given with some improvement in creatinine thus far. Despite treatment of UTI with ceftriaxone, fevers have continued. ID consulted and work up underway including CT abd/pelvis.   Assessment & Plan: Principal Problem:   Fever Active Problems:   Aortic stenosis   CKD (chronic kidney disease), stage IV (HCC)   Anemia of chronic disease   Benign essential HTN   Acute lower UTI   Pressure injury of skin  Fever: With strongly positive UA and typical symptoms ceftriaxone given but negative urine culture. Continues to have fevers despite abx. - Has completed expected course of ceftriaxone. - Due to ongoing fevers, ID consulted. Will check inflammatory markers, CT abd/pelvis with recent urological instrumentation. Contrast not possible with renal impairment.   AKI on stage IV CKD:  - BUN, creatinine improving with IVF's, UOP remains good. Continue LR per nephrology. - Avoid nephrotoxins  NAGMA: Changed to LR - Monitor BMP  Acute metabolic encephalopathy: Possibly due to uremia vs. UTI. BUN down significantly. - Improving.   Anemia of chronic disease:  - Hgb down to 7.5, stable today at 7.6 without bleeding. May need ESA, defer to nephrology.  - Continue CBC monitoring. Transfusion threshold probably 7g/dl.  Benign essential HTN:  - Continue home medications  Stage I pressure injury bilateral buttocks. Not known POA status.  - Offload as able.  Obesity: BMI 32 - Weight loss long term  recommended.  DVT prophylaxis: SCDs Code Status: Full Family Communication: None at bedside this AM. Disposition Plan: PT/OT recommending SNF at this time. Will need further work up of FUO and continued improvement in renal failure.  Consultants:   Nephrology  Urology  Procedures:   Foley catheter  Antimicrobials:  Ceftriaxone   Subjective: No new complaints, denies back pain, but has significant pelvic pain since stenting but also had similar pain documented prior. No dysuria, cough, dyspnea, chest pain.  Objective: Vitals:   01/18/19 2300 01/19/19 0149 01/19/19 0414 01/19/19 0913  BP: (!) 131/92  (!) 141/78 (!) 149/81  Pulse: (!) 101 (!) 101 89 91  Resp:   18 18  Temp:  (!) 101.1 F (38.4 C) 98.4 F (36.9 C) 98.5 F (36.9 C)  TempSrc:  Oral Oral Oral  SpO2:   97% 98%  Weight:      Height:        Intake/Output Summary (Last 24 hours) at 01/19/2019 1420 Last data filed at 01/19/2019 1000 Gross per 24 hour  Intake 3375.42 ml  Output 3000 ml  Net 375.42 ml   Filed Weights   01/18/19 0200 01/18/19 1820  Weight: 88.9 kg 88.9 kg   Gen: 73 y.o. female in no distress Pulm: Nonlabored breathing room air. Clear. CV: Regular rate and rhythm. Soft systolic murmur at base, no rub, or gallop. No JVD, trace dependent edema. GI: Abdomen soft, tender worst in lower quadrants with guarding, no rebound. Non-distended, with normoactive bowel sounds.  Ext: Warm, no deformities Skin: No new rashes, lesions or ulcers on visualized skin. Neuro: Alert and oriented. No focal neurological deficits. Psych: Judgement and insight appear  fair. Mood euthymic & affect congruent. Behavior is appropriate.    Data Reviewed: I have personally reviewed following labs and imaging studies  CBC: Recent Labs  Lab 01/13/19 1439 01/16/19 2241 01/18/19 0416 01/19/19 0456  WBC 7.9 10.6* 7.5 6.5  NEUTROABS  --  8.2*  --   --   HGB 8.6* 8.2* 7.5* 7.6*  HCT 27.2* 26.7* 23.5* 23.0*  MCV 95.1  98.5 93.6 91.3  PLT 246 173 164 510   Basic Metabolic Panel: Recent Labs  Lab 01/13/19 1439 01/16/19 2241 01/18/19 0416 01/19/19 0456  NA 136 135 139 134*  K 3.5 3.9 3.4* 3.2*  CL 100 102 109 102  CO2 23 19* 17* 18*  GLUCOSE 114* 183* 120* 152*  BUN 94* 99* 77* 58*  CREATININE 5.34* 6.00* 4.37* 3.35*  CALCIUM 10.1 9.6 8.7* 9.1  PHOS  --   --   --  3.7   GFR: Estimated Creatinine Clearance: 16.2 mL/min (A) (by C-G formula based on SCr of 3.35 mg/dL (H)). Liver Function Tests: Recent Labs  Lab 01/16/19 2241 01/19/19 0456  AST 14*  --   ALT 5  --   ALKPHOS 89  --   BILITOT 0.7  --   PROT 6.7  --   ALBUMIN 3.2* 2.2*   Recent Labs  Lab 01/16/19 2241  LIPASE 26   No results for input(s): AMMONIA in the last 168 hours. Coagulation Profile: No results for input(s): INR, PROTIME in the last 168 hours. Cardiac Enzymes: No results for input(s): CKTOTAL, CKMB, CKMBINDEX, TROPONINI in the last 168 hours. BNP (last 3 results) No results for input(s): PROBNP in the last 8760 hours. HbA1C: No results for input(s): HGBA1C in the last 72 hours. CBG: Recent Labs  Lab 01/13/19 1347 01/14/19 0559 01/14/19 1023 01/16/19 2334  GLUCAP 115* 110* 150* 162*   Lipid Profile: No results for input(s): CHOL, HDL, LDLCALC, TRIG, CHOLHDL, LDLDIRECT in the last 72 hours. Thyroid Function Tests: No results for input(s): TSH, T4TOTAL, FREET4, T3FREE, THYROIDAB in the last 72 hours. Anemia Panel: No results for input(s): VITAMINB12, FOLATE, FERRITIN, TIBC, IRON, RETICCTPCT in the last 72 hours. Urine analysis:    Component Value Date/Time   COLORURINE YELLOW 01/16/2019 2151   APPEARANCEUR CLOUDY (A) 01/16/2019 2151   LABSPEC 1.012 01/16/2019 2151   PHURINE 6.0 01/16/2019 2151   GLUCOSEU 50 (A) 01/16/2019 2151   HGBUR MODERATE (A) 01/16/2019 2151   BILIRUBINUR NEGATIVE 01/16/2019 2151   [place] NEGATIVE 01/16/2019 2151   PROTEINUR 100 (A) 01/16/2019 2151   NITRITE NEGATIVE  01/16/2019 2151   LEUKOCYTESUR LARGE (A) 01/16/2019 2151   Recent Results (from the past 240 hour(s))  Urine culture     Status: None   Collection Time: 01/16/19  9:51 PM  Result Value Ref Range Status   Specimen Description   Final    URINE, RANDOM Performed at [facility], [place] [address], [place], [place] [address]    Special Requests   Final    NONE Performed at [facility], [place] [address], [place], [place] [address]    Culture   Final    NO GROWTH Performed at [facility], [place] [address], [place], [place] [address]    Report Status 01/18/2019 FINAL  Final  Culture, blood (routine x 2)     Status: None (Preliminary result)   Collection Time: 01/16/19 10:41 PM  Result Value Ref Range Status   Specimen Description   Final  BLOOD LEFT ANTECUBITAL Performed at [facility] [address] [place], [place] [address]    Special Requests   Final    BOTTLES DRAWN AEROBIC AND ANAEROBIC Blood Culture adequate volume Performed at [facility] [address], [place], [place] [address]    Culture   Final    NO GROWTH 2 DAYS Performed at [facility] [address], [place], [place] [address]    Report Status PENDING  Incomplete  Respiratory Panel by PCR     Status: None   Collection Time: 01/17/19 12:23 AM  Result Value Ref Range Status   Adenovirus NOT DETECTED NOT DETECTED Final   Coronavirus 229E NOT DETECTED NOT DETECTED Final    Comment: (NOTE) The Coronavirus on the Respiratory Panel, DOES NOT test for the novel  Coronavirus (2019 nCoV)    Coronavirus HKU1 NOT DETECTED NOT DETECTED Final   Coronavirus NL63 NOT DETECTED NOT DETECTED Final   Coronavirus OC43 NOT DETECTED NOT DETECTED Final   Metapneumovirus NOT DETECTED NOT DETECTED Final   Rhinovirus / Enterovirus NOT DETECTED NOT DETECTED Final   Influenza A NOT DETECTED NOT DETECTED Final   Influenza B NOT DETECTED NOT  DETECTED Final   Parainfluenza Virus 1 NOT DETECTED NOT DETECTED Final   Parainfluenza Virus 2 NOT DETECTED NOT DETECTED Final   Parainfluenza Virus 3 NOT DETECTED NOT DETECTED Final   Parainfluenza Virus 4 NOT DETECTED NOT DETECTED Final   Respiratory Syncytial Virus NOT DETECTED NOT DETECTED Final   Bordetella pertussis NOT DETECTED NOT DETECTED Final   Chlamydophila pneumoniae NOT DETECTED NOT DETECTED Final   Mycoplasma pneumoniae NOT DETECTED NOT DETECTED Final    Comment: Performed at [facility], [address], [place], [place] [address]      Radiology Studies: US Renal  Result Date: 01/18/2019 CLINICAL DATA:  Acute renal injury EXAM: RENAL / URINARY TRACT ULTRASOUND COMPLETE COMPARISON:  CT AP 05/13/2018 FINDINGS: Right Kidney: Renal measurements: 11.6 x 6.5 x 6.1 cm = volume: 237 mL. Increased echogenicity. No obstructive uropathy or mass. Left Kidney: Renal measurements: 9.6 x 4.8 x 4.5 cm = volume: 108 mL. Increased echogenicity. No obstructive uropathy or mass. Bladder: Decompressed by Foley catheter. IMPRESSION: 1. Increased renal echogenicity which can be seen in medical renal disease. 2. No obstructive uropathy or renal mass. 3. Decompressed urinary bladder with Foley. Electronically Signed   By: Ashley Royalty M.D.   On: 01/18/2019 03:06    Scheduled Meds: . famotidine  10 mg Oral Daily  . ferrous sulfate  325 mg Oral BID WC  . fesoterodine  4 mg Oral QHS  . folic acid  1 mg Oral Daily  . heparin injection (subcutaneous)  5,000 Units Subcutaneous Q8H  . levothyroxine  75 mcg Oral QAC breakfast  . mirabegron ER  50 mg Oral Daily  . vitamin B-12  1,000 mcg Oral Daily   Continuous Infusions: . lactated ringers 100 mL/hr at 01/19/19 0935     LOS: 2 days   Time spent: 25 minutes.  Patrecia Pour, MD [facility] [email or web address] Password [email or web address] 01/19/2019, 2:20 PM

## 2019-01-19 NOTE — Evaluation (Signed)
Occupational Therapy Evaluation Patient Details Name: Erika Fry MRN: [id number] DOB: Jan 01, 1946 Today's Date: 01/19/2019    History of Present Illness Pt admitted with UTI and fever; recent DC from urology. PMH of lumbar spine osteomyelitis/discitis, CKD, HTN, DM, pacemaker.   Clinical Impression   Pt with decline in function and safety with ADLs an ADL mobility. Eval limited due to pt fatigue/wekaness and L LE pain. Pt unable to attempt transfers and requires max A with LB ADLs. PTA, pt lived at home alone and was independent with ADLs/selfcare and home mgt and used AD/DME for mobility and showers. Pt would benefit from acute OT services to address impairments to maximize level of function and safety    Follow Up Recommendations  SNF    Equipment Recommendations  Other (comment)(TBD at next venue of care)    Recommendations for Other Services       Precautions / Restrictions Precautions Precautions: Fall Restrictions Weight Bearing Restrictions: No      Mobility Bed Mobility Overal bed mobility: Needs Assistance Bed Mobility: Rolling;Sit to Supine;Supine to Sit Rolling: Supervision   Supine to sit: Max assist Sit to supine: Max assist   General bed mobility comments: increased time and effort, assist to bring LEs to EOB and to elevate trunk  Transfers                 General transfer comment: did not attempt due to pt weakness    Balance Overall balance assessment: Needs assistance Sitting-balance support: Bilateral upper extremity supported;Feet supported Sitting balance-Leahy Scale: Poor Sitting balance - Comments: Pt able to sit EOB at times without PT assistance but as she fatigued, required modA to maintain upright and v/c to move to upright helped minimally. Postural control: Right lateral lean                                 ADL either performed or assessed with clinical judgement   ADL Overall ADL's : Needs  assistance/impaired     Grooming: Wash/dry hands;Wash/dry face;Sitting;Min guard   Upper Body Bathing: Min guard;Sitting   Lower Body Bathing: Maximal assistance   Upper Body Dressing : Min guard;Sitting   Lower Body Dressing: Maximal assistance     Toilet Transfer Details (indicate cue type and reason): NT due to weakness/fatigue Toileting- Clothing Manipulation and Hygiene: Total assistance;Bed level         General ADL Comments: pt limite due to fatigue/weakness and L LE pain     Vision Baseline Vision/History: Wears glasses Wears Glasses: At all times Patient Visual Report: No change from baseline       Perception     Praxis      Pertinent Vitals/Pain Pain Assessment: 0-10 Pain Score: 6  Faces Pain Scale: Hurts whole lot Pain Location: L LE Pain Descriptors / Indicators: Grimacing;Guarding;Moaning Pain Intervention(s): Limited activity within patient's tolerance;Monitored during session;Repositioned     Hand Dominance Right   Extremity/Trunk Assessment Upper Extremity Assessment Upper Extremity Assessment: Generalized weakness   Lower Extremity Assessment Lower Extremity Assessment: Defer to PT evaluation       Communication Communication Communication: No difficulties   Cognition Arousal/Alertness: Awake/alert Behavior During Therapy: WFL for tasks assessed/performed Overall Cognitive Status: Within Functional Limits for tasks assessed  General Comments  noted LE edema bilaterally    Exercises     Shoulder Instructions      Home Living Family/patient expects to be discharged to:: Private residence Living Arrangements: Alone Available Help at Discharge: Family;Available 24 hours/day Type of Home: House Home Access: Stairs to enter [employer] of Steps: 1 Entrance Stairs-Rails: None Home Layout: Two level;Able to live on main level with bedroom/bathroom Alternate Level  Stairs-Number of Steps: Pt says she has a basement but does not use it.   Bathroom Shower/Tub: Occupational psychologist: Handicapped height     Home Equipment: Environmental consultant - 2 wheels;Cane - single point;Wheelchair - [employer];Shower seat          Prior Functioning/Environment Level of Independence: Needs assistance  Gait / Transfers Assistance Needed: Pt states that she was just finally walking again before this admission after a long recovery from lumbar osteomyelitis. ADL's / Homemaking Assistance Needed: pt reports that she was independent with ADLs/sefcare and home mgt            OT Problem List: Decreased strength;Decreased activity tolerance;Pain;Impaired balance (sitting and/or standing)      OT Treatment/Interventions: Self-care/ADL training;Therapeutic exercise;DME and/or AE instruction;Therapeutic activities;Patient/family education    OT Goals(Current goals can be found in the care plan section) Acute Rehab OT Goals Patient Stated Goal: get back to walking OT Goal Formulation: With patient/family Time For Goal Achievement: 02/02/19 Potential to Achieve Goals: Good ADL Goals Pt Will Perform Grooming: with supervision;with set-up;sitting Pt Will Perform Upper Body Bathing: with supervision;with set-up;sitting Pt Will Perform Lower Body Bathing: with mod assist;sitting/lateral leans Pt Will Perform Upper Body Dressing: with supervision;with set-up;sitting Pt Will Transfer to Toilet: with max assist;with mod assist;stand pivot transfer;bedside commode Pt Will Perform Toileting - Clothing Manipulation and hygiene: with max assist;with mod assist;sitting/lateral leans;sit to/from stand Additional ADL Goal #1: Pt will complete bed mobiliyt with mod A to sit EOB in prep for ADLs/selfcare  OT Frequency: Min 2X/week   Barriers to D/C: Decreased caregiver support          Co-evaluation              AM-PAC OT "6 Clicks" Daily Activity     Outcome  Measure Help from another person eating meals?: None Help from another person taking care of personal grooming?: A Little Help from another person toileting, which includes using toliet, bedpan, or urinal?: Total Help from another person bathing (including washing, rinsing, drying)?: A Lot Help from another person to put on and taking off regular upper body clothing?: A Little Help from another person to put on and taking off regular lower body clothing?: A Lot 6 Click Score: 15   End of Session Equipment Utilized During Treatment: Oxygen  Activity Tolerance: Patient limited by fatigue;Patient limited by pain Patient left: in bed;with call bell/phone within reach;with family/visitor present  OT Visit Diagnosis: Muscle weakness (generalized) (M62.81);Pain;Other abnormalities of gait and mobility (R26.89) Pain - Right/Left: Left Pain - part of body: Leg                Time: 9924-2683 OT Time Calculation (min): 27 min Charges:  OT General Charges $OT Visit: 1 Visit OT Evaluation $OT Eval Moderate Complexity: 1 Mod OT Treatments $Self Care/Home Management : 8-22 mins    Erika, Fry 01/19/2019, 2:10 PM

## 2019-01-19 NOTE — Evaluation (Signed)
Physical Therapy Evaluation Patient Details Name: Erika Fry MRN: [id number] DOB: Sep 06, 1946 Today's Date: 01/19/2019   History of Present Illness  Pt admitted with UTI and fever; recent DC from urology. PMH of lumbar spine osteomyelitis/discitis, CKD, HTN, DM, pacemaker.   Clinical Impression   Pt received in bed, willing to participate in therapy. She reports history of hospitalization for osteomyelitis with long recovery including using a w/c for mobility, but states that she was just starting to be able to walk and move around independently again before this hospitalization. She is very motivated despite circumstances. Reports pain in L LE that increases with movement. Rolling with S, but supine to/from sit with maxA. Able to move legs in bed but unable to elevate trunk. Sat EOB for approximately 4 minutes. HR WNL (80s to low 90s) while supine, after supine to sit, and while sitting EOB. Pt returned to supine at this point, as she was starting to become very fatigued. Pt left in bed with needs met, call bell in reach, bed alarm active. Based on functional mobility observed today, recommending SNF for DC. Pt will continue to benefit from skilled acute PT services to improve her functional mobility to ensure safe DC.     Follow Up Recommendations SNF    Equipment Recommendations  Other (comment)(defer to next venue)    Recommendations for Other Services       Precautions / Restrictions Precautions Precautions: Fall Restrictions Weight Bearing Restrictions: No      Mobility  Bed Mobility Overal bed mobility: Needs Assistance Bed Mobility: Rolling;Sit to Supine;Supine to Sit Rolling: Supervision   Supine to sit: Max assist Sit to supine: Max assist   General bed mobility comments: Pt able to roll in bed with S, v/c for use of railing to assist. Pt able to walk LEs to side of bed with minA and increased time due to pain, but required maxA to elevate trunk to sitting.  Strong lean to R side in sitting. MaxA for LE management and repositioning to return to supine.  Transfers                 General transfer comment: did not attempt due to pt weakness  Ambulation/Gait                Stairs            Wheelchair Mobility    Modified Rankin (Stroke Patients Only)       Balance Overall balance assessment: Needs assistance Sitting-balance support: Bilateral upper extremity supported;Feet supported Sitting balance-Leahy Scale: Poor Sitting balance - Comments: Pt able to sit EOB at times without PT assistance but as she fatigued, required modA to maintain upright and v/c to move to upright helped minimally. Postural control: Right lateral lean                                   Pertinent Vitals/Pain Pain Assessment: Faces Faces Pain Scale: Hurts whole lot Pain Location: L LE Pain Descriptors / Indicators: Grimacing;Guarding;Moaning Pain Intervention(s): Limited activity within patient's tolerance;Monitored during session    Home Living Family/patient expects to be discharged to:: Private residence Living Arrangements: Alone Available Help at Discharge: Family;Available 24 hours/day Type of Home: House Home Access: Stairs to enter Entrance Stairs-Rails: None Entrance Stairs-Number of Steps: 1 Home Layout: Two level;Able to live on main level with bedroom/bathroom Home Equipment: Gilford Rile - 2 wheels;Cane - single point;Wheelchair -  manual;Bedside commode      Prior Function Level of Independence: Needs assistance   Gait / Transfers Assistance Needed: Pt states that she was just finally walking again before this admission after a long recovery from lumbar osteomyelitis.           Hand Dominance   Dominant Hand: Right    Extremity/Trunk Assessment   Upper Extremity Assessment Upper Extremity Assessment: Generalized weakness    Lower Extremity Assessment Lower Extremity Assessment: Generalized  weakness       Communication   Communication: No difficulties  Cognition Arousal/Alertness: Awake/alert Behavior During Therapy: WFL for tasks assessed/performed Overall Cognitive Status: Within Functional Limits for tasks assessed                                        General Comments General comments (skin integrity, edema, etc.): noted LE edema bilaterally    Exercises     Assessment/Plan    PT Assessment Patient needs continued PT services  PT Problem List Decreased strength;Decreased balance;Pain;Decreased mobility;Decreased activity tolerance;Decreased coordination       PT Treatment Interventions DME instruction;Functional mobility training;Balance training;Patient/family education;Gait training;Therapeutic activities;Neuromuscular re-education;Stair training;Therapeutic exercise    PT Goals (Current goals can be found in the Care Plan section)  Acute Rehab PT Goals Patient Stated Goal: get back to walking PT Goal Formulation: With patient Time For Goal Achievement: 02/02/19 Potential to Achieve Goals: Fair    Frequency Min 2X/week   Barriers to discharge Other (comment) Pt lives alone and states her family may be able to stay with her if needed.    Co-evaluation               AM-PAC PT "6 Clicks" Mobility  Outcome Measure Help needed turning from your back to your side while in a flat bed without using bedrails?: A Little Help needed moving from lying on your back to sitting on the side of a flat bed without using bedrails?: A Lot Help needed moving to and from a bed to a chair (including a wheelchair)?: Total Help needed standing up from a chair using your arms (e.g., wheelchair or bedside chair)?: Total Help needed to walk in hospital room?: Total Help needed climbing 3-5 steps with a railing? : Total 6 Click Score: 9    End of Session Equipment Utilized During Treatment: Oxygen Activity Tolerance: Patient limited by  fatigue;Patient limited by pain Patient left: in bed;with call bell/phone within reach;with family/visitor present;with bed alarm set;with SCD's reapplied Nurse Communication: Mobility status PT Visit Diagnosis: Other abnormalities of gait and mobility (R26.89);Muscle weakness (generalized) (M62.81);Pain Pain - Right/Left: Left Pain - part of body: Leg    Time:  -      Charges:              Ronnell Guadalajara, SPT   Ronnell Guadalajara 01/19/2019, 12:36 PM

## 2019-01-19 NOTE — Consult Note (Signed)
Date of Admission:  01/16/2019          Reason for Consult: FUO   Referring Provider: Dr. Vilma Meckel   Assessment:  1. FUO 2. Diffuse abdominal pain and distention 3. Anorexia 4. Confusion 5. Acute on chronic renal failure 6. Recent discharge from the Urology service after having undergone cystoscopy with retrograde pyelogram, ureteroscopic he with bladder biopsies and a right stent placement 7. History of prior meant for culture-negative discitis 8. 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 Pacemaker  Plan:  1. CT abdomen and pelvis without IV but with oral contrast 2. DC ceftriaxone 3. Check ESR and CRP 4. CT of the abdomen is unrevealing would get a CT of the chest as well and can embark on a full FUO work-up including likely reimaging her spine   Principal Problem:   Fever Active Problems:   Aortic stenosis   CKD (chronic kidney disease), stage IV (HCC)   Anemia of chronic disease   Benign essential HTN   Acute lower UTI   Pressure injury of skin   Scheduled Meds: . famotidine  10 mg Oral Daily  . ferrous sulfate  325 mg Oral BID WC  . fesoterodine  4 mg Oral QHS  . folic acid  1 mg Oral Daily  . heparin injection (subcutaneous)  5,000 Units Subcutaneous Q8H  . levothyroxine  75 mcg Oral QAC breakfast  . mirabegron ER  50 mg Oral Daily  . vitamin B-12  1,000 mcg Oral Daily   Continuous Infusions: . lactated ringers 100 mL/hr at 01/19/19 0935   PRN Meds:.acetaminophen **OR** acetaminophen, fluticasone, lidocaine  HPI: Erika Fry is a 73 y.o. female was recently discharged on the urology service after having undergone cystoscopy Dr. grade pyelogram placement of a side stent and biopsy as work-up for elevated pain and hematuria.  She was brought to the hospital due to worsening confusion.  In the ER blood cultures were taken along with a urine culture and she was placed on ceftriaxone due to the high likelihood of her having a  urinary tract infection given her recent instrumentation by urology and the fact that she was endorsing dysuria and was confused and febrile.  Her urine cultures however have remained sterile and she has remained febrile on ceftriaxone.  Also had a respiratory panel sent and this was negative.  Infectious disease history is pertinent for "culture negative" lumbar discitis that was treated with IV daptomycin and ceftriaxone that was then converted into Zyvox and ceftriaxone to complete 6 weeks of therapy followed by protracted oral doxycycline which was completed in August 2019 under the care of Dr. Baxter Flattery  (of note she was already having pelvic pain when seeing Dr. Baxter Flattery prior to workup by Urology)  Review of Systems: Review of Systems  Unable to perform ROS: Mental status change    Past Medical History:  Diagnosis Date  . Arthritis    "knees, thumbs" (03/25/2018)  . CKD (chronic kidney disease), stage IV ([place])   . Diet-controlled diabetes mellitus ([place])   . GERD (gastroesophageal reflux disease)   . Gout    "on daily RX" (03/25/2018)  . Heart murmur   . High cholesterol   . Hypertension   . Hypothyroidism   . Iron deficiency anemia    "had to get an iron infusion"  . Migraine    "used to have them growing up" (03/25/2018)  . Presence of permanent cardiac pacemaker 03/25/2018    Social  History   Tobacco Use  . Smoking status: Never Smoker  . Smokeless tobacco: Never Used  Substance Use Topics  . Alcohol use: Not Currently  . Drug use: Never    Family History  Problem Relation Age of Onset  . Hypertension Mother   . Diabetes Mellitus II Father   . Heart disease Father   . Gastric cancer Brother   . Diabetes Mellitus II Brother    Allergies  Allergen Reactions  . Baclofen Other (See Comments)    somnolence  . Codeine Other (See Comments)    Increases Pain and couldn't sleep    OBJECTIVE: Blood pressure (!) 149/81, pulse 91, temperature 98.5 F (36.9 C),  temperature source Oral, resp. rate 18, height 5' 5"  (1.651 m), weight 88.9 kg, SpO2 98 %.  Physical Exam Constitutional:      General: She is not in acute distress.    Appearance: She is not ill-appearing.  HENT:     Head: Normocephalic and atraumatic.     Nose: Nose normal.     Mouth/Throat:     Mouth: Mucous membranes are moist.     Pharynx: No posterior oropharyngeal erythema.  Eyes:     General: No scleral icterus.       Left eye: No discharge.     Extraocular Movements: Extraocular movements intact.     Conjunctiva/sclera: Conjunctivae normal.  Neck:     Musculoskeletal: No neck rigidity or muscular tenderness.     Vascular: No carotid bruit.  Cardiovascular:     Rate and Rhythm: Normal rate.     Heart sounds: No murmur. No friction rub. No gallop.   Pulmonary:     Effort: No respiratory distress.     Breath sounds: No stridor. No wheezing or rales.  Abdominal:     General: There is distension.     Palpations: There is no mass.     Tenderness: There is abdominal tenderness. There is guarding. There is no rebound.     Hernia: No hernia is present.  Musculoskeletal: Normal range of motion.  Lymphadenopathy:     Cervical: No cervical adenopathy.  Skin:    General: Skin is warm and dry.  Neurological:     Mental Status: She is alert. She is disoriented.    While she thought she was at [employer] long she knew the date exactly as February 18  Lab Results Lab Results  Component Value Date   WBC 6.5 01/19/2019   HGB 7.6 (L) 01/19/2019   HCT 23.0 (L) 01/19/2019   MCV 91.3 01/19/2019   PLT 174 01/19/2019    Lab Results  Component Value Date   CREATININE 3.35 (H) 01/19/2019   BUN 58 (H) 01/19/2019   NA 134 (L) 01/19/2019   K 3.2 (L) 01/19/2019   CL 102 01/19/2019   CO2 18 (L) 01/19/2019    Lab Results  Component Value Date   ALT 5 01/16/2019   AST 14 (L) 01/16/2019   ALKPHOS 89 01/16/2019   BILITOT 0.7 01/16/2019     Microbiology: Recent Results (from the  past 240 hour(s))  Urine culture     Status: None   Collection Time: 01/16/19  9:51 PM  Result Value Ref Range Status   Specimen Description   Final    URINE, RANDOM Performed at [facility], [place] [address] [place], [place] [address]    Special Requests   Final    NONE Performed at [facility], [place]  [address] [place], [place] [address]    Culture   Final    NO GROWTH Performed at [facility], [place] [address], [place], [place] [address]    Report Status 01/18/2019 FINAL  Final  Culture, blood (routine x 2)     Status: None (Preliminary result)   Collection Time: 01/16/19 10:41 PM  Result Value Ref Range Status   Specimen Description   Final    BLOOD LEFT ANTECUBITAL Performed at [facility] [address], [place], [place] [address]    Special Requests   Final    BOTTLES DRAWN AEROBIC AND ANAEROBIC Blood Culture adequate volume Performed at [facility] [address] [place], [place] [address]    Culture   Final    NO GROWTH 2 DAYS Performed at [facility] [address] [place], [place] [address]    Report Status PENDING  Incomplete  Respiratory Panel by PCR     Status: None   Collection Time: 01/17/19 12:23 AM  Result Value Ref Range Status   Adenovirus NOT DETECTED NOT DETECTED Final   Coronavirus 229E NOT DETECTED NOT DETECTED Final    Comment: (NOTE) The Coronavirus on the Respiratory Panel, DOES NOT test for the novel  Coronavirus (2019 nCoV)    Coronavirus HKU1 NOT DETECTED NOT DETECTED Final   Coronavirus NL63 NOT DETECTED NOT DETECTED Final   Coronavirus OC43 NOT DETECTED NOT DETECTED Final   Metapneumovirus NOT DETECTED NOT DETECTED Final   Rhinovirus / Enterovirus NOT DETECTED NOT DETECTED Final   Influenza A NOT DETECTED NOT DETECTED Final   Influenza B NOT DETECTED NOT DETECTED Final   Parainfluenza Virus 1 NOT DETECTED NOT DETECTED Final   Parainfluenza  Virus 2 NOT DETECTED NOT DETECTED Final   Parainfluenza Virus 3 NOT DETECTED NOT DETECTED Final   Parainfluenza Virus 4 NOT DETECTED NOT DETECTED Final   Respiratory Syncytial Virus NOT DETECTED NOT DETECTED Final   Bordetella pertussis NOT DETECTED NOT DETECTED Final   Chlamydophila pneumoniae NOT DETECTED NOT DETECTED Final   Mycoplasma pneumoniae NOT DETECTED NOT DETECTED Final    Comment: Performed at [facility], [address], [place], [place] [address]    Alcide Evener, [facility] [phone] pager  01/19/2019, 1:22 PM

## 2019-01-19 NOTE — Progress Notes (Signed)
6Admit: 01/16/2019 LOS: 2  72F with AoCKD4 (BL SCr 2.5-3.5) after cystoscopy, hydrodistension, R kidney stone lithotripsy/extraction, R ureteral stent placement 2/13  Subjective:  . Serum creatinine improved further.  K 3.2.   .  4.5 LUO yesterday, 3.3L in, on LR @ 100/h . Intermittent low grade fevers. BP Stable . On ceftriaxone, urine culture no growth  02/17 0701 - 02/18 0700 In: 3315.4 [P.O.:720; I.V.:2495.4; IV Piggyback:100] Out: 3005 [Urine:4550]  Filed Weights   01/18/19 0200 01/18/19 1820  Weight: 88.9 kg 88.9 kg    Scheduled Meds: . famotidine  10 mg Oral Daily  . ferrous sulfate  325 mg Oral BID WC  . fesoterodine  4 mg Oral QHS  . folic acid  1 mg Oral Daily  . heparin injection (subcutaneous)  5,000 Units Subcutaneous Q8H  . levothyroxine  75 mcg Oral QAC breakfast  . mirabegron ER  50 mg Oral Daily  . vitamin B-12  1,000 mcg Oral Daily   Continuous Infusions: . lactated ringers 100 mL/hr at 01/19/19 0935   PRN Meds:.acetaminophen **OR** acetaminophen, fluticasone, lidocaine  Current Labs: reviewed    Physical Exam:  Blood pressure (!) 149/81, pulse 91, temperature 98.5 F (36.9 C), temperature source Oral, resp. rate 18, height 5\' 5"  (1.651 m), weight 88.9 kg, SpO2 98 %. NAD, elderly female lying in bed Regular, normal S1 and S2 CTA B No significant lower extremity edema Awake, alert, oriented; nonfocal  A 1. Acute on progressive chronic renal failure; appears recent baseline creatinine was progressing and was 5.3 prior to urological procedures; follows with Dr. Johnney Ou at [facility].  Working theory was dehydration but greater high urine output from presentation is not consistent with this.  Nevertheless, renal function is showing improvement since admission.  Currently, no indication for dialysis 2. Chronic pelvic pain s/p cystoscopy, hydrodistention, right ureteral stent placement, lithotripsy on 01/14/2018 3. Mild metabolic acidosis related to #1, LR IV  fluids 4. Fever, cultures no growth to date, on ceftriaxone 5. Type 2 diabetes 6. Gout 7. Anemia, normocytic; chronic.  P . Cont LR at current time . Continue to monitor closely; no indication for dialysis.  Pearson Grippe MD 01/19/2019, 11:21 AM  Recent Labs  Lab 01/16/19 2241 01/18/19 0416 01/19/19 0456  NA 135 139 134*  K 3.9 3.4* 3.2*  CL 102 109 102  CO2 19* 17* 18*  GLUCOSE 183* 120* 152*  BUN 99* 77* 58*  CREATININE 6.00* 4.37* 3.35*  CALCIUM 9.6 8.7* 9.1  PHOS  --   --  3.7   Recent Labs  Lab 01/16/19 2241 01/18/19 0416 01/19/19 0456  WBC 10.6* 7.5 6.5  NEUTROABS 8.2*  --   --   HGB 8.2* 7.5* 7.6*  HCT 26.7* 23.5* 23.0*  MCV 98.5 93.6 91.3  PLT 173 164 174

## 2019-01-20 ENCOUNTER — Inpatient Hospital Stay (HOSPITAL_COMMUNITY): Payer: Medicare Other

## 2019-01-20 DIAGNOSIS — Z960 Presence of urogenital implants: Secondary | ICD-10-CM

## 2019-01-20 DIAGNOSIS — R8281 Pyuria: Secondary | ICD-10-CM

## 2019-01-20 DIAGNOSIS — M25562 Pain in left knee: Secondary | ICD-10-CM

## 2019-01-20 LAB — URINALYSIS, ROUTINE W REFLEX MICROSCOPIC
Bilirubin Urine: NEGATIVE
Glucose, UA: NEGATIVE mg/dL
Ketones, ur: 5 mg/dL — AB
Nitrite: NEGATIVE
Protein, ur: 100 mg/dL — AB
Specific Gravity, Urine: 1.008 (ref 1.005–1.030)
WBC, UA: >50 WBC/hpf — ABNORMAL HIGH (ref 0–5)
pH: 5 (ref 5.0–8.0)

## 2019-01-20 LAB — BASIC METABOLIC PANEL
Anion gap: 13 (ref 5–15)
BUN: 47 mg/dL — AB (ref 8–23)
CO2: 19 mmol/L — ABNORMAL LOW (ref 22–32)
Calcium: 9.1 mg/dL (ref 8.9–10.3)
Chloride: 101 mmol/L (ref 98–111)
Creatinine, Ser: 2.87 mg/dL — ABNORMAL HIGH (ref 0.44–1.00)
GFR calc Af Amer: 18 mL/min — ABNORMAL LOW (ref >60)
GFR calc non Af Amer: 16 mL/min — ABNORMAL LOW (ref >60)
Glucose, Bld: 118 mg/dL — ABNORMAL HIGH (ref 70–99)
POTASSIUM: 3.1 mmol/L — AB (ref 3.5–5.1)
Sodium: 133 mmol/L — ABNORMAL LOW (ref 135–145)

## 2019-01-20 LAB — CBC
HCT: 24.3 % — ABNORMAL LOW (ref 36.0–46.0)
Hemoglobin: 8 g/dL — ABNORMAL LOW (ref 12.0–15.0)
MCH: 29.9 pg (ref 26.0–34.0)
MCHC: 32.9 g/dL (ref 30.0–36.0)
MCV: 90.7 fL (ref 80.0–100.0)
Platelets: 189 10*3/uL (ref 150–400)
RBC: 2.68 MIL/uL — ABNORMAL LOW (ref 3.87–5.11)
RDW: 13.3 % (ref 11.5–15.5)
WBC: 8.3 10*3/uL (ref 4.0–10.5)
nRBC: 0 % (ref 0.0–0.2)

## 2019-01-20 LAB — SEDIMENTATION RATE: Sed Rate: 98 mm/hr — ABNORMAL HIGH (ref 0–22)

## 2019-01-20 LAB — SYNOVIAL CELL COUNT + DIFF, W/ CRYSTALS
Crystals, Fluid: NONE SEEN
Eosinophils-Synovial: 0 % (ref 0–1)
Lymphocytes-Synovial Fld: 15 % (ref 0–20)
Monocyte-Macrophage-Synovial Fluid: 24 % — ABNORMAL LOW (ref 50–90)
NEUTROPHIL, SYNOVIAL: 61 % — AB (ref 0–25)
WBC, Synovial: 255 /mm3 — ABNORMAL HIGH (ref 0–200)

## 2019-01-20 LAB — C-REACTIVE PROTEIN: CRP: 20 mg/dL — ABNORMAL HIGH (ref <1.0)

## 2019-01-20 MED ORDER — URELLE 81 MG PO TABS
1.0000 | ORAL_TABLET | Freq: Four times a day (QID) | ORAL | Status: DC | PRN
  Administered 2019-01-20 – 2019-01-29 (×13): 81 mg via ORAL
  Filled 2019-01-20 (×15): qty 1

## 2019-01-20 NOTE — Consult Note (Signed)
Reason for Consult:Left knee pain Referring Physician: S Allisyn Fry is an 73 y.o. female.  HPI: Erika Fry was admitted with FUO on 2/15. She has a presumed urologic etiology though workup has been equivocal. She has had left knee pain for a couple of weeks and orthopedic surgery was consulted to r/o septic joint. She isn't the best historian but it seems that over the last few days it has gotten worse as she'Erika been unable to walk on it 2/2 pain. She denies any prior hx/o similar symptoms. She has a remote hx/o gout but has only ever had it in her feet. She has had recent weight loss.  Past Medical History:  Diagnosis Date  . Arthritis    "knees, thumbs" (03/25/2018)  . CKD (chronic kidney disease), stage IV ([place])   . Diet-controlled diabetes mellitus ([place])   . GERD (gastroesophageal reflux disease)   . Gout    "on daily RX" (03/25/2018)  . Heart murmur   . High cholesterol   . Hypertension   . Hypothyroidism   . Iron deficiency anemia    "had to get an iron infusion"  . Migraine    "used to have them growing up" (03/25/2018)  . Presence of permanent cardiac pacemaker 03/25/2018    Past Surgical History:  Procedure Laterality Date  . APPENDECTOMY    . BIOPSY  05/19/2018   Procedure: BIOPSY;  Surgeon: Ladene Artist, MD;  Location: [place];  Service: Endoscopy;;  . Consuela Mimes W/ RETROGRADES Bilateral 01/14/2019   Procedure: CYSTOSCOPY WITH RETROGRADE PYELOGRAM BILATERAL HYDRODISTENTION;  Surgeon: Ardis Hughs, MD;  Location: [facility];  Service: Urology;  Laterality: Bilateral;  . ESOPHAGOGASTRODUODENOSCOPY N/A 05/19/2018   Procedure: ESOPHAGOGASTRODUODENOSCOPY (EGD);  Surgeon: Ladene Artist, MD;  Location[facility] ENDOSCOPY;  Service: Endoscopy;  Laterality: N/A;  . INSERT / REPLACE / REMOVE PACEMAKER  03/25/2018  . IR FLUORO GUIDE CV LINE RIGHT  05/18/2018  . IR LUMBAR DISC ASPIRATION W/IMG GUIDE  05/15/2018  . IR REMOVAL TUN CV CATH W/O FL  07/23/2018  . IR US  GUIDE VASC ACCESS RIGHT  05/18/2018  . KNEE ARTHROSCOPY Bilateral   . PACEMAKER IMPLANT N/A 03/25/2018   Procedure: PACEMAKER IMPLANT;  Surgeon: Evans Lance, MD;  Location: [place] [facility];  Service: Cardiovascular;  Laterality: N/A;  . PACEMAKER PLACEMENT Left 03/2018  . RIGHT HEART CATH N/A 03/20/2018   Procedure: RIGHT HEART CATH;  Surgeon: Nigel Mormon, MD;  Location: [place] CV LAB;  Service: Cardiovascular;  Laterality: N/A;  . TEE WITHOUT CARDIOVERSION N/A 03/20/2018   Procedure: TRANSESOPHAGEAL ECHOCARDIOGRAM (TEE);  Surgeon: Nigel Mormon, MD;  Location: [facility] ENDOSCOPY;  Service: Cardiovascular;  Laterality: N/A;  . TONSILLECTOMY    . URETEROSCOPY WITH HOLMIUM LASER LITHOTRIPSY Right 01/14/2019   Procedure: URETEROSCOPY WITH HOLMIUM LASER LITHOTRIPSY BLADDER BIOPSIES RIGHT STENT PLACEMENT;  Surgeon: Ardis Hughs, MD;  Location: [facility];  Service: Urology;  Laterality: Right;    Family History  Problem Relation Age of Onset  . Hypertension Mother   . Diabetes Mellitus II Father   . Heart disease Father   . Gastric cancer Brother   . Diabetes Mellitus II Brother     Social History:  reports that she has never smoked. She has never used smokeless tobacco. She reports previous alcohol use. She reports that she does not use drugs.  Allergies:  Allergies  Allergen Reactions  . Baclofen Other (See Comments)    somnolence  .  Codeine Other (See Comments)    Increases Pain and couldn't sleep    Medications: I have reviewed the patient'Erika current medications.  Results for orders placed or performed during the hospital encounter of 01/16/19 (from the past 48 hour(Erika))  Renal function panel     Status: Abnormal   Collection Time: 01/19/19  4:56 AM  Result Value Ref Range   Sodium 134 (L) 135 - 145 mmol/L   Potassium 3.2 (L) 3.5 - 5.1 mmol/L   Chloride 102 98 - 111 mmol/L   CO2 18 (L) 22 - 32 mmol/L   Glucose, Bld 152 (H) 70 - 99 mg/dL   BUN 58 (H) 8 -  23 mg/dL   Creatinine, Ser 3.35 (H) 0.44 - 1.00 mg/dL   Calcium 9.1 8.9 - 10.3 mg/dL   Phosphorus 3.7 2.5 - 4.6 mg/dL   Albumin 2.2 (L) 3.5 - 5.0 g/dL   GFR calc non Af Amer 13 (L) >60 mL/min   GFR calc Af Amer 15 (L) >60 mL/min   Anion gap 14 5 - 15    Comment: Performed at [facility], [address], [place], [place] [address]  CBC     Status: Abnormal   Collection Time: 01/19/19  4:56 AM  Result Value Ref Range   WBC 6.5 4.0 - 10.5 K/uL   RBC 2.52 (L) 3.87 - 5.11 MIL/uL   Hemoglobin 7.6 (L) 12.0 - 15.0 g/dL   HCT 23.0 (L) 36.0 - 46.0 %   MCV 91.3 80.0 - 100.0 fL   MCH 30.2 26.0 - 34.0 pg   MCHC 33.0 30.0 - 36.0 g/dL   RDW 13.4 11.5 - 15.5 %   Platelets 174 150 - 400 K/uL   nRBC 0.0 0.0 - 0.2 %    Comment: Performed at [facility], [place] [address] [place], [place] [address]  Sedimentation rate     Status: Abnormal   Collection Time: 01/20/19  3:56 AM  Result Value Ref Range   Sed Rate 98 (H) 0 - 22 mm/hr    Comment: Performed at [facility] [address], [place], [place] [address]  C-reactive protein     Status: Abnormal   Collection Time: 01/20/19  3:56 AM  Result Value Ref Range   CRP 20.0 (H) <1.0 mg/dL    Comment: Performed at [facility] [address], [place], [place] [address]  CBC     Status: Abnormal   Collection Time: 01/20/19  3:56 AM  Result Value Ref Range   WBC 8.3 4.0 - 10.5 K/uL   RBC 2.68 (L) 3.87 - 5.11 MIL/uL   Hemoglobin 8.0 (L) 12.0 - 15.0 g/dL   HCT 24.3 (L) 36.0 - 46.0 %   MCV 90.7 80.0 - 100.0 fL   MCH 29.9 26.0 - 34.0 pg   MCHC 32.9 30.0 - 36.0 g/dL   RDW 13.3 11.5 - 15.5 %   Platelets 189 150 - 400 K/uL   nRBC 0.0 0.0 - 0.2 %    Comment: Performed at [facility], [place] [address] [place], [place] [address]  Basic metabolic panel     Status: Abnormal   Collection Time: 01/20/19  3:56 AM  Result Value Ref Range   Sodium 133 (L) 135 - 145 mmol/L   Potassium 3.1 (L) 3.5 - 5.1 mmol/L   Chloride 101 98 -  111 mmol/L   CO2 19 (L) 22 - 32 mmol/L   Glucose, Bld  118 (H) 70 - 99 mg/dL   BUN 47 (H) 8 - 23 mg/dL   Creatinine, Ser 2.87 (H) 0.44 - 1.00 mg/dL   Calcium 9.1 8.9 - 10.3 mg/dL   GFR calc non Af Amer 16 (L) >60 mL/min   GFR calc Af Amer 18 (L) >60 mL/min   Anion gap 13 5 - 15    Comment: Performed at [facility] [address], [place], [place] [address]  Urinalysis, Routine w reflex microscopic     Status: Abnormal   Collection Time: 01/20/19  6:49 AM  Result Value Ref Range   Color, Urine YELLOW YELLOW   APPearance TURBID (A) CLEAR   Specific Gravity, Urine 1.008 1.005 - 1.030   pH 5.0 5.0 - 8.0   Glucose, UA NEGATIVE NEGATIVE mg/dL   Hgb urine dipstick MODERATE (A) NEGATIVE   Bilirubin Urine NEGATIVE NEGATIVE   Ketones, ur 5 (A) NEGATIVE mg/dL   Protein, ur 100 (A) NEGATIVE mg/dL   Nitrite NEGATIVE NEGATIVE   Leukocytes,Ua LARGE (A) NEGATIVE   RBC / HPF 11-20 0 - 5 RBC/hpf   WBC, UA >50 (H) 0 - 5 WBC/hpf   Bacteria, UA RARE (A) NONE SEEN   WBC Clumps PRESENT    Budding Yeast PRESENT     Comment: Performed at [facility] [address], [place], [place] [address]    Ct Abdomen Pelvis Wo Contrast  Result Date: 01/19/2019 CLINICAL DATA:  Fever unknown origin EXAM: CT ABDOMEN AND PELVIS WITHOUT CONTRAST TECHNIQUE: Multidetector CT imaging of the abdomen and pelvis was performed following the standard protocol without IV contrast. COMPARISON:  CT abdomen pelvis 05/13/2018.  Lumbar MRI 08/31/2018 FINDINGS: Lower chest: Mild bibasilar atelectasis and small pleural effusions bilaterally. Pacemaker. Hepatobiliary: No focal liver lesion. Upper abdominal evaluation limited due to overlying arms and artifact. Gallbladder distended with layering sludge or small gallstones. No gallbladder wall thickening or biliary dilatation. Pancreas: Negative Spleen: Negative Adrenals/Urinary Tract: Right ureteral stent in good position. Mild right hydronephrosis. Calculus in the right  renal pelvis appears smaller compared with the prior study with interval lithotripsy noted. Stranding around the right kidney and ureter could represent infection. Correlate with urinalysis. 6 mm left upper pole calculus. No left-sided obstruction. Foley catheter in the bladder with the bladder empty. Stomach/Bowel: Negative for bowel obstruction. No bowel mass or edema. Vascular/Lymphatic: Mild atherosclerotic disease without aortic aneurysm. No mass or adenopathy. Reproductive: Normal uterus.  No pelvic mass. Other: Mild presacral edema. No pelvic fluid collection or abscess identified. Musculoskeletal: Disc space narrowing and endplate erosion at E8-3. Findings are similar to the prior MRI and consistent with discitis/osteomyelitis which may be chronic. IMPRESSION: : IMPRESSION: 1. Findings compatible with discitis/osteomyelitis at L4-5. Findings similar to the prior lumbar MRI. Repeat lumbar MRI without intravenous contrast given renal insufficiency recommended. 2. Probable gallstones without evidence of cholecystitis. 3. Right ureteral stent. Mild right hydronephrosis and hydroureter. Decreased right renal calculus following lithotripsy. Recommend correlation with urinalysis to rule out infection. Electronically Signed   By: Franchot Gallo M.D.   On: 01/19/2019 17:09    Review of Systems  Constitutional: Positive for fever, malaise/fatigue and weight loss. Negative for chills.  HENT: Negative for ear discharge, ear pain, hearing loss and tinnitus.   Eyes: Negative for blurred vision, double vision, photophobia and pain.  Respiratory: Negative for cough, sputum production and shortness of breath.   Cardiovascular: Negative for chest pain.  Gastrointestinal: Negative for abdominal pain, nausea and vomiting.  Genitourinary: Negative for dysuria, flank pain, frequency and urgency.  Musculoskeletal: Positive for joint pain (Left knee). Negative for back pain, falls, myalgias and neck pain.  Neurological:  Negative for dizziness, tingling, sensory change, focal weakness, loss of consciousness and headaches.  Endo/Heme/Allergies: Does not bruise/bleed easily.  Psychiatric/Behavioral: Negative for depression, memory loss and substance abuse. The patient is not nervous/anxious.    Blood pressure 120/81, pulse (!) 110, temperature 97.6 F (36.4 C), temperature source Oral, resp. rate 18, height 5\' 5"  (1.651 m), weight 83.8 kg, SpO2 95 %. Physical Exam  Constitutional: She appears well-developed and well-nourished. No distress.  HENT:  Head: Normocephalic and atraumatic.  Eyes: Conjunctivae are normal. Right eye exhibits no discharge. Left eye exhibits no discharge. No scleral icterus.  Neck: Normal range of motion.  Cardiovascular: Normal rate and regular rhythm.  Respiratory: Effort normal. No respiratory distress.  Musculoskeletal:     Comments: LLE No traumatic wounds, ecchymosis, or rash  Mod medial knee TTP, severe pain with AROM/PROM  No ankle effusion, mod knee effusion  Knee stable to varus/ valgus and anterior/posterior stress  Sens DPN, SPN, TN intact  Motor EHL, ext, flex, evers 5/5  DP 2+, PT 2+, No significant edema  Neurological: She is alert.  Skin: Skin is warm and dry. She is not diaphoretic.  Psychiatric: She has a normal mood and affect. Her behavior is normal.    Assessment/Plan: Left knee pain -- Have aspirated joint, fluid grossly clear, unlikely to be septic. Will keep NPO until gram stain resulted. Suspect gout, either precipitated by this recent illness or her weight loss (or a combination).    Lisette Abu, PA-C Orthopedic Surgery [phone] 01/20/2019, 10:29 AM

## 2019-01-20 NOTE — Progress Notes (Signed)
6Admit: 01/16/2019 LOS: 3  15F with AoCKD4 (BL SCr 2.5-3.5) after cystoscopy, hydrodistension, R kidney stone lithotripsy/extraction, R ureteral stent placement 2/13  Subjective:  . Ongoing fevers overnight, ID following . CT abdomen pelvis with findings concerning for discitis/osteomyelitis at L4/L5. . 1.4 L urine output yesterday . Serum creatinine further improved to 2.7, BUN 47, K3.1, bicarbonate 19. . Remains on LR at 100 mL's per  02/18 0701 - 02/19 0700 In: 9357 [P.O.:300; I.V.:1143] Out: 1350 [Urine:1350]  Filed Weights   01/18/19 0200 01/18/19 1820 01/19/19 2028  Weight: 88.9 kg 88.9 kg 83.8 kg    Scheduled Meds: . famotidine  10 mg Oral Daily  . ferrous sulfate  325 mg Oral BID WC  . fesoterodine  4 mg Oral QHS  . folic acid  1 mg Oral Daily  . heparin injection (subcutaneous)  5,000 Units Subcutaneous Q8H  . levothyroxine  75 mcg Oral QAC breakfast  . mirabegron ER  50 mg Oral Daily  . vitamin B-12  1,000 mcg Oral Daily   Continuous Infusions: . lactated ringers 100 mL/hr at 01/19/19 1940   PRN Meds:.acetaminophen **OR** acetaminophen, fluticasone, lidocaine  Current Labs: reviewed    Physical Exam:  Blood pressure 120/81, pulse (!) 110, temperature 97.6 F (36.4 C), temperature source Oral, resp. rate 18, height 5\' 5"  (1.651 m), weight 83.8 kg, SpO2 95 %. NAD, elderly female lying in bed Regular, normal S1 and S2 CTA B No significant lower extremity edema Awake, alert, oriented; nonfocal  A 1. Acute on progressive chronic renal failure; appears recent baseline creatinine was progressing and was 5.3 prior to urological procedures; follows with Dr. Johnney Ou at [facility].  Working theory was dehydration but greater high urine output from presentation is not consistent with this.  Nevertheless, renal function is showing tremendous improvement since admission.   2. Chronic pelvic pain s/p cystoscopy, hydrodistention, right ureteral stent placement, lithotripsy on  01/14/2018 3. Mild metabolic acidosis related to #1, LR IV fluids 4. Fever, cultures no growth to date, ID following, ? Discitis/OM of L4/L5 5. Type 2 diabetes 6. Gout 7. Anemia, normocytic; chronic.  P . Cont LR until tolerating diet . No further suggestions at the current time, will sign off. . Will arrange close follow-up at our office with Dr. Jonna Clark MD 01/20/2019, 11:00 AM  Recent Labs  Lab 01/18/19 0416 01/19/19 0456 01/20/19 0356  NA 139 134* 133*  K 3.4* 3.2* 3.1*  CL 109 102 101  CO2 17* 18* 19*  GLUCOSE 120* 152* 118*  BUN 77* 58* 47*  CREATININE 4.37* 3.35* 2.87*  CALCIUM 8.7* 9.1 9.1  PHOS  --  3.7  --    Recent Labs  Lab 01/16/19 2241 01/18/19 0416 01/19/19 0456 01/20/19 0356  WBC 10.6* 7.5 6.5 8.3  NEUTROABS 8.2*  --   --   --   HGB 8.2* 7.5* 7.6* 8.0*  HCT 26.7* 23.5* 23.0* 24.3*  MCV 98.5 93.6 91.3 90.7  PLT 173 164 174 189

## 2019-01-20 NOTE — Care Management Important Message (Signed)
Important Message  Patient Details  Name: Erika Fry MRN: [id number] Date of Birth: Sep 14, 1946   Medicare Important Message Given:  Yes    [name] 01/20/2019, 12:10 PM

## 2019-01-20 NOTE — Progress Notes (Signed)
PROGRESS NOTE  Erika Fry  IRW:[id number] DOB: [birth year] DOA: 01/16/2019 PCP: [name], MD   Brief Narrative: 73 y.o.femalewith medical history significantfor stage IV CKD, hypertension, diabetes with recent discharge from urology service who presented for poor per oral intake, pelvic pain, lethargy. Work up demonstrated BUN of 99, and creatinine of 6, with pyuria on UA and dysuria. Ceftriaxone was started and nephrology was consulted. IV fluids have been given with some improvement in creatinine thus far.  01/20/2019: Patient seen.  Also discussed with the infectious disease team and orthopedic team.  Patient reports severe left knee pain.  There are concerns that this could be septic left knee.  Orthopedic team has kindly agreed to see the patient in consultation.  Patient Foley catheter is draining cloudy urine.  On further questioning, patient tells me that this is not new to her.  On 01/16/2019 has not grown any organisms.   Assessment & Plan: Principal Problem:   Fever Active Problems:   Aortic stenosis   CKD (chronic kidney disease), stage IV (HCC)   Anemia of chronic disease   Benign essential HTN   Acute lower UTI   Pressure injury of skin  Fever, cause unclear: -Work-up to rule out left septic knee is in progress.   -Orthopedic team has been consulted.   -Infectious disease team input is highly appreciated.   -Urine culture has not grown any organisms to date.  She may have been on several antibiotics.   -Further management will depend on hospital course.   -Patient is off antibiotics at the moment.    Left knee pain:  Currently see above.   Further management depend on hospital course.    AKI on stage IV CKD:  - BUN, creatinine improving with IVF's, UOP is good.  -Changing NS to LR per nephrology. - Avoid nephrotoxins 01/20/2019: Acute kidney injury is improving.  Nephrology and urology input is highly appreciated.  Acute embolic encephalopathy:    -Seems to have resolved significantly.   -Etiology is likely multifactorial.  Anemia: Chronic.  Normocytic.  Likely multifactorial.    Benign essential HTN:  - Continue home medications  Stage I pressure injury bilateral buttocks. Not known POA status (as per prior documentation).  - Offload as able.  Obesity:  -BMI 32 - Weight loss long term recommended.  DVT prophylaxis: SCDs Code Status: Full Family Communication: None at bedside this AM. Disposition Plan: Home most likely once improving. PT/OT consulted.   Consultants:   Nephrology  Urology  Infectious disease  Orthopedics  Procedures:   Foley catheter  Antimicrobials:  Ceftriaxone   Subjective: Left knee pain.  Objective: Vitals:   01/19/19 1817 01/19/19 2028 01/20/19 0524 01/20/19 0845  BP: (!) 151/79 (!) 144/81 111/75 120/81  Pulse: (!) 106 (!) 106 (!) 102 (!) 110  Resp: 18 (!) 22 19 18   Temp:  (!) 101 F (38.3 C) 97.6 F (36.4 C)   TempSrc:  Oral Oral   SpO2: 100% 96% 93% 95%  Weight:  83.8 kg    Height:        Intake/Output Summary (Last 24 hours) at 01/20/2019 0952 Last data filed at 01/20/2019 0856 Gross per 24 hour  Intake 1443.04 ml  Output 1350 ml  Net 93.04 ml   Filed Weights   01/18/19 0200 01/18/19 1820 01/19/19 2028  Weight: 88.9 kg 88.9 kg 83.8 kg    Gen: 73 y.o. female in mild painful distress.    Pulm: Non-labored breathing. Clear to auscultation bilaterally.  CV: S1-S2. GI: Morbidly obese, soft and nontender.  Organs are difficult to assess.   Ext: Swelling of the upper and lower extremities  Neuro: Alert and oriented.  Patient moves all extremities.  Data Reviewed: I have personally reviewed following labs and imaging studies  CBC: Recent Labs  Lab 01/13/19 1439 01/16/19 2241 01/18/19 0416 01/19/19 0456 01/20/19 0356  WBC 7.9 10.6* 7.5 6.5 8.3  NEUTROABS  --  8.2*  --   --   --   HGB 8.6* 8.2* 7.5* 7.6* 8.0*  HCT 27.2* 26.7* 23.5* 23.0* 24.3*  MCV 95.1  98.5 93.6 91.3 90.7  PLT 246 173 164 174 161   Basic Metabolic Panel: Recent Labs  Lab 01/13/19 1439 01/16/19 2241 01/18/19 0416 01/19/19 0456 01/20/19 0356  NA 136 135 139 134* 133*  K 3.5 3.9 3.4* 3.2* 3.1*  CL 100 102 109 102 101  CO2 23 19* 17* 18* 19*  GLUCOSE 114* 183* 120* 152* 118*  BUN 94* 99* 77* 58* 47*  CREATININE 5.34* 6.00* 4.37* 3.35* 2.87*  CALCIUM 10.1 9.6 8.7* 9.1 9.1  PHOS  --   --   --  3.7  --    GFR: Estimated Creatinine Clearance: 18.4 mL/min (A) (by C-G formula based on SCr of 2.87 mg/dL (H)). Liver Function Tests: Recent Labs  Lab 01/16/19 2241 01/19/19 0456  AST 14*  --   ALT 5  --   ALKPHOS 89  --   BILITOT 0.7  --   PROT 6.7  --   ALBUMIN 3.2* 2.2*   Recent Labs  Lab 01/16/19 2241  LIPASE 26   No results for input(s): AMMONIA in the last 168 hours. Coagulation Profile: No results for input(s): INR, PROTIME in the last 168 hours. Cardiac Enzymes: No results for input(s): CKTOTAL, CKMB, CKMBINDEX, TROPONINI in the last 168 hours. BNP (last 3 results) No results for input(s): PROBNP in the last 8760 hours. HbA1C: No results for input(s): HGBA1C in the last 72 hours. CBG: Recent Labs  Lab 01/13/19 1347 01/14/19 0559 01/14/19 1023 01/16/19 2334  GLUCAP 115* 110* 150* 162*   Lipid Profile: No results for input(s): CHOL, HDL, LDLCALC, TRIG, CHOLHDL, LDLDIRECT in the last 72 hours. Thyroid Function Tests: No results for input(s): TSH, T4TOTAL, FREET4, T3FREE, THYROIDAB in the last 72 hours. Anemia Panel: No results for input(s): VITAMINB12, FOLATE, FERRITIN, TIBC, IRON, RETICCTPCT in the last 72 hours. Urine analysis:    Component Value Date/Time   COLORURINE YELLOW 01/20/2019 0649   APPEARANCEUR TURBID (A) 01/20/2019 0649   LABSPEC 1.008 01/20/2019 0649   PHURINE 5.0 01/20/2019 0649   GLUCOSEU NEGATIVE 01/20/2019 0649   HGBUR MODERATE (A) 01/20/2019 0649   BILIRUBINUR NEGATIVE 01/20/2019 0649   KETONESUR 5 (A) 01/20/2019  0649   PROTEINUR 100 (A) 01/20/2019 0649   NITRITE NEGATIVE 01/20/2019 0649   LEUKOCYTESUR LARGE (A) 01/20/2019 0649   Recent Results (from the past 240 hour(s))  Urine culture     Status: None   Collection Time: 01/16/19  9:51 PM  Result Value Ref Range Status   Specimen Description   Final    URINE, RANDOM Performed at [facility], [place] [address] [place], [place] [address]    Special Requests   Final    NONE Performed at [facility], [place] [address] [place], [place] [address]    Culture   Final    NO GROWTH Performed at [facility], [place],  [place] [address]    Report Status 01/18/2019 FINAL  Final  Culture, blood (routine x 2)     Status: None (Preliminary result)   Collection Time: 01/16/19 10:41 PM  Result Value Ref Range Status   Specimen Description   Final    BLOOD LEFT ANTECUBITAL Performed at [facility] [address], [place], [place] [address]    Special Requests   Final    BOTTLES DRAWN AEROBIC AND ANAEROBIC Blood Culture adequate volume Performed at [facility] [address], [place], [place] [address]    Culture   Final    NO GROWTH 2 DAYS Performed at [facility] [address], [place], [place] [address]    Report Status PENDING  Incomplete  Respiratory Panel by PCR     Status: None   Collection Time: 01/17/19 12:23 AM  Result Value Ref Range Status   Adenovirus NOT DETECTED NOT DETECTED Final   Coronavirus 229E NOT DETECTED NOT DETECTED Final    Comment: (NOTE) The Coronavirus on the Respiratory Panel, DOES NOT test for the novel  Coronavirus (2019 nCoV)    Coronavirus HKU1 NOT DETECTED NOT DETECTED Final   Coronavirus NL63 NOT DETECTED NOT DETECTED Final   Coronavirus OC43 NOT DETECTED NOT DETECTED Final   Metapneumovirus NOT DETECTED NOT DETECTED Final   Rhinovirus / Enterovirus NOT DETECTED NOT DETECTED Final   Influenza A NOT  DETECTED NOT DETECTED Final   Influenza B NOT DETECTED NOT DETECTED Final   Parainfluenza Virus 1 NOT DETECTED NOT DETECTED Final   Parainfluenza Virus 2 NOT DETECTED NOT DETECTED Final   Parainfluenza Virus 3 NOT DETECTED NOT DETECTED Final   Parainfluenza Virus 4 NOT DETECTED NOT DETECTED Final   Respiratory Syncytial Virus NOT DETECTED NOT DETECTED Final   Bordetella pertussis NOT DETECTED NOT DETECTED Final   Chlamydophila pneumoniae NOT DETECTED NOT DETECTED Final   Mycoplasma pneumoniae NOT DETECTED NOT DETECTED Final    Comment: Performed at [facility], [address], [place], [place] [address]      Radiology Studies: Ct Abdomen Pelvis Wo Contrast  Result Date: 01/19/2019 CLINICAL DATA:  Fever unknown origin EXAM: CT ABDOMEN AND PELVIS WITHOUT CONTRAST TECHNIQUE: Multidetector CT imaging of the abdomen and pelvis was performed following the standard protocol without IV contrast. COMPARISON:  CT abdomen pelvis 05/13/2018.  Lumbar MRI 08/31/2018 FINDINGS: Lower chest: Mild bibasilar atelectasis and small pleural effusions bilaterally. Pacemaker. Hepatobiliary: No focal liver lesion. Upper abdominal evaluation limited due to overlying arms and artifact. Gallbladder distended with layering sludge or small gallstones. No gallbladder wall thickening or biliary dilatation. Pancreas: Negative Spleen: Negative Adrenals/Urinary Tract: Right ureteral stent in good position. Mild right hydronephrosis. Calculus in the right renal pelvis appears smaller compared with the prior study with interval lithotripsy noted. Stranding around the right kidney and ureter could represent infection. Correlate with urinalysis. 6 mm left upper pole calculus. No left-sided obstruction. Foley catheter in the bladder with the bladder empty. Stomach/Bowel: Negative for bowel obstruction. No bowel mass or edema. Vascular/Lymphatic: Mild atherosclerotic disease without aortic aneurysm. No mass or adenopathy.  Reproductive: Normal uterus.  No pelvic mass. Other: Mild presacral edema. No pelvic fluid collection or abscess identified. Musculoskeletal: Disc space narrowing and endplate erosion at O8-4. Findings are similar to the prior MRI and consistent with discitis/osteomyelitis which may be chronic. IMPRESSION: : IMPRESSION: 1. Findings compatible with discitis/osteomyelitis at L4-5. Findings similar to the prior lumbar MRI. Repeat lumbar MRI without  intravenous contrast given renal insufficiency recommended. 2. Probable gallstones without evidence of cholecystitis. 3. Right ureteral stent. Mild right hydronephrosis and hydroureter. Decreased right renal calculus following lithotripsy. Recommend correlation with urinalysis to rule out infection. Electronically Signed   By: Franchot Gallo M.D.   On: 01/19/2019 17:09    Scheduled Meds: . famotidine  10 mg Oral Daily  . ferrous sulfate  325 mg Oral BID WC  . fesoterodine  4 mg Oral QHS  . folic acid  1 mg Oral Daily  . heparin injection (subcutaneous)  5,000 Units Subcutaneous Q8H  . levothyroxine  75 mcg Oral QAC breakfast  . mirabegron ER  50 mg Oral Daily  . vitamin B-12  1,000 mcg Oral Daily   Continuous Infusions: . lactated ringers 100 mL/hr at 01/19/19 1940     LOS: 3 days   Time spent: 25 minutes.  Bonnell Public, MD [facility] [email or web address] Password [email or web address] 01/20/2019, 9:52 AM

## 2019-01-20 NOTE — Procedures (Signed)
Procedure: Left knee aspiration and injection  Indication: Left knee effusion(s)  Surgeon: Silvestre Gunner, PA-C  Assist: None  Anesthesia: None  EBL: None  Complications: None  Findings: After risks/benefits explained patient desires to undergo procedure. Consent obtained. The left knee was sterilely prepped and aspirated. 60ml clear yellow fluid obtained. Pt tolerated the procedure well.    Lisette Abu, PA-C Orthopedic Surgery [phone]

## 2019-01-20 NOTE — Progress Notes (Signed)
Subjective:  She is complaining of severe pain in her left knee and her entire leg   Antibiotics:  Anti-infectives (From admission, onward)   Start     Dose/Rate Route Frequency Ordered Stop   01/17/19 2200  cefTRIAXone (ROCEPHIN) 1 g in sodium chloride 0.9 % 100 mL IVPB  Status:  Discontinued     1 g 200 mL/hr over 30 Minutes Intravenous Every 24 hours 01/17/19 0054 01/19/19 0914   01/16/19 2345  cefTRIAXone (ROCEPHIN) 1 g in sodium chloride 0.9 % 100 mL IVPB     1 g 200 mL/hr over 30 Minutes Intravenous  Once 01/16/19 2330 01/17/19 0055      Medications: Scheduled Meds: . famotidine  10 mg Oral Daily  . ferrous sulfate  325 mg Oral BID WC  . fesoterodine  4 mg Oral QHS  . folic acid  1 mg Oral Daily  . heparin injection (subcutaneous)  5,000 Units Subcutaneous Q8H  . levothyroxine  75 mcg Oral QAC breakfast  . mirabegron ER  50 mg Oral Daily  . vitamin B-12  1,000 mcg Oral Daily   Continuous Infusions: . lactated ringers 100 mL/hr at 01/19/19 1940   PRN Meds:.acetaminophen **OR** acetaminophen, fluticasone, lidocaine    Objective: Weight change: -5.1 kg  Intake/Output Summary (Last 24 hours) at 01/20/2019 1655 Last data filed at 01/20/2019 1230 Gross per 24 hour  Intake 1123.11 ml  Output 1900 ml  Net -776.89 ml   Blood pressure 120/81, pulse (!) 110, temperature 97.6 F (36.4 C), temperature source Oral, resp. rate 18, height 5\' 5"  (1.651 m), weight 83.8 kg, SpO2 95 %. Temp:  [97.6 F (36.4 C)-101 F (38.3 C)] 97.6 F (36.4 C) (02/19 0524) Pulse Rate:  [102-110] 110 (02/19 0845) Resp:  [18-22] 18 (02/19 0845) BP: (111-151)/(75-81) 120/81 (02/19 0845) SpO2:  [93 %-100 %] 95 % (02/19 0845) Weight:  [83.8 kg] 83.8 kg (02/18 2028)  Physical Exam: General: Alert and awake, oriented person and place, not in any acute distress. HEENT: anicteric sclera, EOMI CVS regular rate, normal  No mgr Chest: , no wheezing, no respiratory distress  CTAG Abdomen: Tender to palpation diffusely Extremities: Left knee effusion and exquisite tenderness to palpation and reduced active and passive range of motion.  Skin: no rashes Neuro: nonfocal  CBC:    BMET Recent Labs    01/19/19 0456 01/20/19 0356  NA 134* 133*  K 3.2* 3.1*  CL 102 101  CO2 18* 19*  GLUCOSE 152* 118*  BUN 58* 47*  CREATININE 3.35* 2.87*  CALCIUM 9.1 9.1     Liver Panel  Recent Labs    01/19/19 0456  ALBUMIN 2.2*       Sedimentation Rate Recent Labs    01/20/19 0356  ESRSEDRATE 98*   C-Reactive Protein Recent Labs    01/20/19 0356  CRP 20.0*    Micro Results: Recent Results (from the past 720 hour(s))  Urine culture     Status: None   Collection Time: 01/16/19  9:51 PM  Result Value Ref Range Status   Specimen Description   Final    URINE, RANDOM Performed at [facility] [address] [place], [place] [address]    Special Requests   Final    NONE Performed at [facility], [place] [address], [place], [place] [address]    Culture   Final    NO GROWTH Performed at [facility], [place]  [address], [place], [place] [address]    Report Status 01/18/2019 FINAL  Final  Culture, blood (routine x 2)     Status: None (Preliminary result)   Collection Time: 01/16/19 10:41 PM  Result Value Ref Range Status   Specimen Description   Final    BLOOD LEFT ANTECUBITAL Performed at [facility] [address], [place], [place] [address]    Special Requests   Final    BOTTLES DRAWN AEROBIC AND ANAEROBIC Blood Culture adequate volume Performed at [facility] [address], [place], [place] [address]    Culture   Final    NO GROWTH 3 DAYS Performed at [facility], [place] [address], [place], [place] [address]    Report Status PENDING  Incomplete  Respiratory Panel by PCR     Status: None   Collection Time: 01/17/19 12:23 AM  Result Value Ref  Range Status   Adenovirus NOT DETECTED NOT DETECTED Final   Coronavirus 229E NOT DETECTED NOT DETECTED Final    Comment: (NOTE) The Coronavirus on the Respiratory Panel, DOES NOT test for the novel  Coronavirus (2019 nCoV)    Coronavirus HKU1 NOT DETECTED NOT DETECTED Final   Coronavirus NL63 NOT DETECTED NOT DETECTED Final   Coronavirus OC43 NOT DETECTED NOT DETECTED Final   Metapneumovirus NOT DETECTED NOT DETECTED Final   Rhinovirus / Enterovirus NOT DETECTED NOT DETECTED Final   Influenza A NOT DETECTED NOT DETECTED Final   Influenza B NOT DETECTED NOT DETECTED Final   Parainfluenza Virus 1 NOT DETECTED NOT DETECTED Final   Parainfluenza Virus 2 NOT DETECTED NOT DETECTED Final   Parainfluenza Virus 3 NOT DETECTED NOT DETECTED Final   Parainfluenza Virus 4 NOT DETECTED NOT DETECTED Final   Respiratory Syncytial Virus NOT DETECTED NOT DETECTED Final   Bordetella pertussis NOT DETECTED NOT DETECTED Final   Chlamydophila pneumoniae NOT DETECTED NOT DETECTED Final   Mycoplasma pneumoniae NOT DETECTED NOT DETECTED Final    Comment: Performed at [facility], [address], [place], [place] [address]  Body fluid culture     Status: None (Preliminary result)   Collection Time: 01/20/19 10:35 AM  Result Value Ref Range Status   Specimen Description SYNOVIAL LEFT KNEE  Final   Special Requests NONE  Final   Gram Stain   Final    RARE WBC PRESENT, PREDOMINANTLY PMN NO ORGANISMS SEEN Performed at [facility], [place] [address], [place], [place] [address]    Culture PENDING  Incomplete   Report Status PENDING  Incomplete    Studies/Results: Ct Abdomen Pelvis Wo Contrast  Result Date: 01/19/2019 CLINICAL DATA:  Fever unknown origin EXAM: CT ABDOMEN AND PELVIS WITHOUT CONTRAST TECHNIQUE: Multidetector CT imaging of the abdomen and pelvis was performed following the standard protocol without IV contrast. COMPARISON:  CT abdomen pelvis 05/13/2018.  Lumbar MRI 08/31/2018  FINDINGS: Lower chest: Mild bibasilar atelectasis and small pleural effusions bilaterally. Pacemaker. Hepatobiliary: No focal liver lesion. Upper abdominal evaluation limited due to overlying arms and artifact. Gallbladder distended with layering sludge or small gallstones. No gallbladder wall thickening or biliary dilatation. Pancreas: Negative Spleen: Negative Adrenals/Urinary Tract: Right ureteral stent in good position. Mild right hydronephrosis. Calculus in the right renal pelvis appears smaller compared with the prior study with interval lithotripsy noted. Stranding around the right kidney and ureter could represent infection. Correlate with urinalysis. 6 mm left upper pole calculus. No left-sided obstruction. Foley catheter in the bladder with the  bladder empty. Stomach/Bowel: Negative for bowel obstruction. No bowel mass or edema. Vascular/Lymphatic: Mild atherosclerotic disease without aortic aneurysm. No mass or adenopathy. Reproductive: Normal uterus.  No pelvic mass. Other: Mild presacral edema. No pelvic fluid collection or abscess identified. Musculoskeletal: Disc space narrowing and endplate erosion at D6-3. Findings are similar to the prior MRI and consistent with discitis/osteomyelitis which may be chronic. IMPRESSION: : IMPRESSION: 1. Findings compatible with discitis/osteomyelitis at L4-5. Findings similar to the prior lumbar MRI. Repeat lumbar MRI without intravenous contrast given renal insufficiency recommended. 2. Probable gallstones without evidence of cholecystitis. 3. Right ureteral stent. Mild right hydronephrosis and hydroureter. Decreased right renal calculus following lithotripsy. Recommend correlation with urinalysis to rule out infection. Electronically Signed   By: Franchot Gallo M.D.   On: 01/19/2019 17:09   Dg Knee Complete 4 Views Left  Result Date: 01/20/2019 CLINICAL DATA:  Severe LEFT knee pain for awhile, fluid drained from LEFT knee this morning, persistent pain  especially with any movement, no known injury EXAM: LEFT KNEE - COMPLETE 4+ VIEW COMPARISON:  None FINDINGS: Osseous demineralization. Joint space narrowing and spur formation greatest at medial compartment. No acute fracture, dislocation, or bone destruction. Scattered soft tissue swelling. Small metallic foreign body at the medial soft tissues of the distal thigh. No significant joint effusion. IMPRESSION: Osteoarthritic changes and osseous demineralization of the LEFT knee. No acute abnormalities. Electronically Signed   By: Lavonia Dana M.D.   On: 01/20/2019 16:15      Assessment/Plan:  INTERVAL HISTORY: CT scan showed prior discitis but does not look very different she now is complaining of severe left-sided knee pain and as of the time of this note being written she has had joint aspiration by orthopedic surgery   Principal Problem:   Fever Active Problems:   Aortic stenosis   CKD (chronic kidney disease), stage IV (HCC)   Anemia of chronic disease   Benign essential HTN   Acute lower UTI   Pressure injury of skin    ZAYLYNN RICKETT is a 73 y.o. female with history of lumbar discitis treated with IV antibiotics followed by oral antibiotics with pelvic pain that was worked up by urology with cystoscopy placement of ureteral stent and biopsies who is continue to complain of pelvic pain but then admitted the hospital with altered mental status and fever without clear cause.  1.  Fever of unknown origin: Given her severe knee pain I am concerned there is infection here.  Her cell count is only 250 white blood cells which is not really overwhelming.  She has been on antibiotics but not for that long I would still expect the cell count to be much more impressive that there were no crystals either.  I would recommend getting an MRI of the knee without contrast.  If this is still unrevealing can consider other imaging such as a spine but she really is not complaining of any back pain so  discitis seems much lower on the differential for causing her fevers  Continue to not give her antibiotics I have reorderedblood cultures  She has pyuria but not really clear urinary symptoms other than the pelvic pain that she has. I dont buy UTI as explanation .      LOS: 3 days   Alcide Evener 01/20/2019, 4:55 PM

## 2019-01-21 ENCOUNTER — Inpatient Hospital Stay (HOSPITAL_COMMUNITY): Payer: Medicare Other

## 2019-01-21 DIAGNOSIS — M25462 Effusion, left knee: Secondary | ICD-10-CM

## 2019-01-21 DIAGNOSIS — R102 Pelvic and perineal pain: Secondary | ICD-10-CM

## 2019-01-21 DIAGNOSIS — N185 Chronic kidney disease, stage 5: Secondary | ICD-10-CM

## 2019-01-21 DIAGNOSIS — E876 Hypokalemia: Secondary | ICD-10-CM

## 2019-01-21 LAB — BASIC METABOLIC PANEL
Anion gap: 10 (ref 5–15)
BUN: 39 mg/dL — ABNORMAL HIGH (ref 8–23)
CO2: 20 mmol/L — AB (ref 22–32)
Calcium: 8.8 mg/dL — ABNORMAL LOW (ref 8.9–10.3)
Chloride: 103 mmol/L (ref 98–111)
Creatinine, Ser: 2.58 mg/dL — ABNORMAL HIGH (ref 0.44–1.00)
GFR calc Af Amer: 20 mL/min — ABNORMAL LOW (ref >60)
GFR calc non Af Amer: 18 mL/min — ABNORMAL LOW (ref >60)
Glucose, Bld: 115 mg/dL — ABNORMAL HIGH (ref 70–99)
Potassium: 3.2 mmol/L — ABNORMAL LOW (ref 3.5–5.1)
Sodium: 133 mmol/L — ABNORMAL LOW (ref 135–145)

## 2019-01-21 MED ORDER — POTASSIUM CHLORIDE CRYS ER 20 MEQ PO TBCR: 40.0000 meq | EXTENDED_RELEASE_TABLET | Freq: Once | ORAL | Status: DC

## 2019-01-21 MED ORDER — HYOSCYAMINE SULFATE 0.125 MG SL SUBL
0.2500 mg | SUBLINGUAL_TABLET | Freq: Once | SUBLINGUAL | Status: AC
  Administered 2019-01-21: 0.25 mg via SUBLINGUAL
  Filled 2019-01-21 (×2): qty 2

## 2019-01-21 NOTE — Progress Notes (Signed)
Subjective:  She is complaining of severe pain in her left knee and her entire leg And also c/o pain in abdomen  Antibiotics:  Anti-infectives (From admission, onward)   Start     Dose/Rate Route Frequency Ordered Stop   01/17/19 2200  cefTRIAXone (ROCEPHIN) 1 g in sodium chloride 0.9 % 100 mL IVPB  Status:  Discontinued     1 g 200 mL/hr over 30 Minutes Intravenous Every 24 hours 01/17/19 0054 01/19/19 0914   01/16/19 2345  cefTRIAXone (ROCEPHIN) 1 g in sodium chloride 0.9 % 100 mL IVPB     1 g 200 mL/hr over 30 Minutes Intravenous  Once 01/16/19 2330 01/17/19 0055      Medications: Scheduled Meds: . famotidine  10 mg Oral Daily  . ferrous sulfate  325 mg Oral BID WC  . fesoterodine  4 mg Oral QHS  . folic acid  1 mg Oral Daily  . heparin injection (subcutaneous)  5,000 Units Subcutaneous Q8H  . levothyroxine  75 mcg Oral QAC breakfast  . mirabegron ER  50 mg Oral Daily  . vitamin B-12  1,000 mcg Oral Daily   Continuous Infusions: . lactated ringers 100 mL/hr at 01/21/19 0021   PRN Meds:.acetaminophen **OR** acetaminophen, fluticasone, lidocaine, URELLE    Objective: Weight change: 0 kg  Intake/Output Summary (Last 24 hours) at 01/21/2019 1129 Last data filed at 01/21/2019 0459 Gross per 24 hour  Intake 3025.58 ml  Output 3251 ml  Net -225.42 ml   Blood pressure 139/67, pulse 82, temperature 98.5 F (36.9 C), temperature source Oral, resp. rate 18, height 5\' 5"  (1.651 m), weight 83.8 kg, SpO2 100 %. Temp:  [98.5 F (36.9 C)-99.8 F (37.7 C)] 98.5 F (36.9 C) (02/20 1021) Pulse Rate:  [82-106] 82 (02/20 1021) Resp:  [18] 18 (02/20 1021) BP: (132-153)/(67-88) 139/67 (02/20 1021) SpO2:  [96 %-100 %] 100 % (02/20 1021) Weight:  [83.8 kg] 83.8 kg (02/19 2133)  Physical Exam: General: Alert and awake, oriented person and place, not in any acute distress. HEENT: anicteric sclera, EOMI CVS regular rate, normal  No mgr Chest: , no wheezing, no  respiratory distress CTAG Abdomen: Tender to palpation diffusely at times seems out of proportion  Extremities: Left knee effusion and tenderness to palpation Skin: no rashes Neuro: nonfocal  CBC:    BMET Recent Labs    01/20/19 0356 01/21/19 0759  NA 133* 133*  K 3.1* 3.2*  CL 101 103  CO2 19* 20*  GLUCOSE 118* 115*  BUN 47* 39*  CREATININE 2.87* 2.58*  CALCIUM 9.1 8.8*     Liver Panel  Recent Labs    01/19/19 0456  ALBUMIN 2.2*       Sedimentation Rate Recent Labs    01/20/19 0356  ESRSEDRATE 98*   C-Reactive Protein Recent Labs    01/20/19 0356  CRP 20.0*    Micro Results: Recent Results (from the past 720 hour(s))  Urine culture     Status: None   Collection Time: 01/16/19  9:51 PM  Result Value Ref Range Status   Specimen Description   Final    URINE, RANDOM Performed at [facility], [place] [address] [place], [place] [address]    Special Requests   Final    NONE Performed at [facility], [place] [address], [place], [place] [address]    Culture   Final    NO GROWTH Performed at [facility],  [address], [place], [place] [address]    Report Status 01/18/2019 FINAL  Final  Culture, blood (routine x 2)     Status: None (Preliminary result)   Collection Time: 01/16/19 10:41 PM  Result Value Ref Range Status   Specimen Description   Final    BLOOD LEFT ANTECUBITAL Performed at [facility] [address], [place], [place] [address]    Special Requests   Final    BOTTLES DRAWN AEROBIC AND ANAEROBIC Blood Culture adequate volume Performed at [facility] [address], [place], [place] [address]    Culture   Final    NO GROWTH 3 DAYS Performed at [facility], [place] [address], [place], [place] [address]    Report Status PENDING  Incomplete  Respiratory Panel by PCR     Status: None   Collection Time: 01/17/19 12:23 AM  Result Value Ref Range  Status   Adenovirus NOT DETECTED NOT DETECTED Final   Coronavirus 229E NOT DETECTED NOT DETECTED Final    Comment: (NOTE) The Coronavirus on the Respiratory Panel, DOES NOT test for the novel  Coronavirus (2019 nCoV)    Coronavirus HKU1 NOT DETECTED NOT DETECTED Final   Coronavirus NL63 NOT DETECTED NOT DETECTED Final   Coronavirus OC43 NOT DETECTED NOT DETECTED Final   Metapneumovirus NOT DETECTED NOT DETECTED Final   Rhinovirus / Enterovirus NOT DETECTED NOT DETECTED Final   Influenza A NOT DETECTED NOT DETECTED Final   Influenza B NOT DETECTED NOT DETECTED Final   Parainfluenza Virus 1 NOT DETECTED NOT DETECTED Final   Parainfluenza Virus 2 NOT DETECTED NOT DETECTED Final   Parainfluenza Virus 3 NOT DETECTED NOT DETECTED Final   Parainfluenza Virus 4 NOT DETECTED NOT DETECTED Final   Respiratory Syncytial Virus NOT DETECTED NOT DETECTED Final   Bordetella pertussis NOT DETECTED NOT DETECTED Final   Chlamydophila pneumoniae NOT DETECTED NOT DETECTED Final   Mycoplasma pneumoniae NOT DETECTED NOT DETECTED Final    Comment: Performed at [facility], [address] [place], [place] [address]  Body fluid culture     Status: None (Preliminary result)   Collection Time: 01/20/19 10:35 AM  Result Value Ref Range Status   Specimen Description SYNOVIAL LEFT KNEE  Final   Special Requests NONE  Final   Gram Stain   Final    RARE WBC PRESENT, PREDOMINANTLY PMN NO ORGANISMS SEEN    Culture   Final    NO GROWTH < 24 HOURS Performed at [facility], [place] [address], [place],  [address]    Report Status PENDING  Incomplete    Studies/Results: Ct Abdomen Pelvis Wo Contrast  Result Date: 01/19/2019 CLINICAL DATA:  Fever unknown origin EXAM: CT ABDOMEN AND PELVIS WITHOUT CONTRAST TECHNIQUE: Multidetector CT imaging of the abdomen and pelvis was performed following the standard protocol without IV contrast. COMPARISON:  CT abdomen pelvis 05/13/2018.  Lumbar MRI 08/31/2018  FINDINGS: Lower chest: Mild bibasilar atelectasis and small pleural effusions bilaterally. Pacemaker. Hepatobiliary: No focal liver lesion. Upper abdominal evaluation limited due to overlying arms and artifact. Gallbladder distended with layering sludge or small gallstones. No gallbladder wall thickening or biliary dilatation. Pancreas: Negative Spleen: Negative Adrenals/Urinary Tract: Right ureteral stent in good position. Mild right hydronephrosis. Calculus in the right renal pelvis appears smaller compared with the prior study with interval lithotripsy noted. Stranding around the right kidney and ureter could represent infection. Correlate with urinalysis. 6 mm left upper pole  calculus. No left-sided obstruction. Foley catheter in the bladder with the bladder empty. Stomach/Bowel: Negative for bowel obstruction. No bowel mass or edema. Vascular/Lymphatic: Mild atherosclerotic disease without aortic aneurysm. No mass or adenopathy. Reproductive: Normal uterus.  No pelvic mass. Other: Mild presacral edema. No pelvic fluid collection or abscess identified. Musculoskeletal: Disc space narrowing and endplate erosion at A2-1. Findings are similar to the prior MRI and consistent with discitis/osteomyelitis which may be chronic. IMPRESSION: : IMPRESSION: 1. Findings compatible with discitis/osteomyelitis at L4-5. Findings similar to the prior lumbar MRI. Repeat lumbar MRI without intravenous contrast given renal insufficiency recommended. 2. Probable gallstones without evidence of cholecystitis. 3. Right ureteral stent. Mild right hydronephrosis and hydroureter. Decreased right renal calculus following lithotripsy. Recommend correlation with urinalysis to rule out infection. Electronically Signed   By: Franchot Gallo M.D.   On: 01/19/2019 17:09   Dg Knee Complete 4 Views Left  Result Date: 01/20/2019 CLINICAL DATA:  Severe LEFT knee pain for awhile, fluid drained from LEFT knee this morning, persistent pain  especially with any movement, no known injury EXAM: LEFT KNEE - COMPLETE 4+ VIEW COMPARISON:  None FINDINGS: Osseous demineralization. Joint space narrowing and spur formation greatest at medial compartment. No acute fracture, dislocation, or bone destruction. Scattered soft tissue swelling. Small metallic foreign body at the medial soft tissues of the distal thigh. No significant joint effusion. IMPRESSION: Osteoarthritic changes and osseous demineralization of the LEFT knee. No acute abnormalities. Electronically Signed   By: Lavonia Dana M.D.   On: 01/20/2019 16:15      Assessment/Plan:  INTERVAL HISTORY: The [facility] aspirated her knee which showed 255 white cells with 61% neutrophils but no organisms seen and no crystals seen.  Principal Problem:   Fever Active Problems:   Aortic stenosis   CKD (chronic kidney disease), stage IV (HCC)   Anemia of chronic disease   Benign essential HTN   Acute lower UTI   Pressure injury of skin    Erika Fry is a 73 y.o. female with history of lumbar discitis treated with IV antibiotics followed by oral antibiotics with pelvic pain that was worked up by urology with cystoscopy placement of ureteral stent and biopsies who is continue to complain of pelvic pain but then admitted the hospital with altered mental status and fever without clear cause.  1.  Fever of unknown origin:   Cell count from the knee was not consistent with a negative septic arthritis in my book though she was receiving antecedent antimicrobials which could have potentially changed cell count and differential I have typically not seen it get this low this quickly.  I would recommend getting an MRI of the knee without contrast.  If this is still unrevealing can consider other imaging such as a spine but she really is not complaining of any back pain so discitis seems much lower on the differential for causing her fevers  Continue to not give her antibiotics I have  reorderedblood cultures  She has pyuria but not really clear urinary symptoms other than the pelvic pain that she has. I dont buy UTI as explanation .      LOS: 4 days   Alcide Evener 01/21/2019, 11:29 AM

## 2019-01-21 NOTE — Progress Notes (Signed)
   01/21/19 2029  Vitals  Temp (!) 102.9 F (39.4 C)  Temp Source Oral  BP (!) 161/82  MAP (mmHg) 103  BP Method Automatic  Pulse Rate (!) 113  Resp (!) 24  Oxygen Therapy  SpO2 95 %  O2 Device Nasal Cannula  O2 Flow Rate (L/min) 2 L/min  Pain Assessment  Pain Scale 0-10  Pain Score 10  Pain Type Acute pain  Pain Location Abdomen  PCA/Epidural/Spinal Assessment  Respiratory Pattern Labored  MEWS Score  MEWS RR 1  MEWS Pulse 2  MEWS Systolic 0  MEWS LOC 0  MEWS Temp 2  MEWS Score 5  MEWS Score Color Red  Provider Notification  Provider Name/Title Schorr, NP  Date Provider Notified 01/21/19  Time Provider Notified 2045  Notification Type Page  Notification Reason Other (Comment) (Uncontrolled pain, fever)

## 2019-01-21 NOTE — Progress Notes (Signed)
Occupational Therapy Treatment Patient Details Name: Erika Fry MRN: [id number] DOB: [birth year] Today's Date: 01/21/2019    History of present illness Pt admitted with UTI and fever; recent DC from urology. PMH of lumbar spine osteomyelitis/discitis, CKD, HTN, DM, pacemaker.   OT comments  Pt making slow progress with functional goals. Pt limited by 7/10 L LE pain and weakness. Pt sat EOB with max A for bed mobility requiring increased time and effort to complete. Pt sat EOB for grooming and ADL tasks. Pt appreciative of the session and reports that she would like to return to her independent PLOF. OT will continue to follow acutely  Follow Up Recommendations  SNF    Equipment Recommendations  Other (comment)(TBD at SNF)    Recommendations for Other Services      Precautions / Restrictions Precautions Precautions: Fall Restrictions Weight Bearing Restrictions: No       Mobility Bed Mobility Overal bed mobility: Needs Assistance Bed Mobility: Supine to Sit;Sit to Supine     Supine to sit: Max assist Sit to supine: Max assist   General bed mobility comments: increased time and effort, assist to bring LEs to EOB and to elevate trunk, assist with LEs back onto bed. Pt able to scoot to HOB seated on EOB with mon - min A  Transfers Overall transfer level: Needs assistance               General transfer comment: did not attempt due to pt pain, weakness and would require +2 assist    Balance Overall balance assessment: Needs assistance Sitting-balance support: Bilateral upper extremity supported;Feet supported Sitting balance-Leahy Scale: Fair Sitting balance - Comments: Pt sat EOB fro grooming and ADL tasks Postural control: Right lateral lean     Standing balance comment: NT                           ADL either performed or assessed with clinical judgement   ADL Overall ADL's : Needs assistance/impaired     Grooming: Wash/dry hands;Wash/dry  face;Sitting;Min guard   Upper Body Bathing: Min guard;Sitting Upper Body Bathing Details (indicate cue type and reason): simulated Lower Body Bathing: Maximal assistance;Sitting/lateral leans;Moderate assistance Lower Body Bathing Details (indicate cue type and reason): simulated Upper Body Dressing : Min guard;Sitting         Toilet Transfer Details (indicate cue type and reason): NT due to weakness/fatigue, pain. Needs +2 assist Toileting- Clothing Manipulation and Hygiene: Total assistance;Bed level               Vision Baseline Vision/History: Wears glasses Wears Glasses: At all times Patient Visual Report: No change from baseline     Perception     Praxis      Cognition Arousal/Alertness: Awake/alert Behavior During Therapy: WFL for tasks assessed/performed Overall Cognitive Status: Within Functional Limits for tasks assessed                                          Exercises     Shoulder Instructions       General Comments      Pertinent Vitals/ Pain       Pain Assessment: 0-10 Pain Score: 7  Pain Location: L LE Pain Descriptors / Indicators: Grimacing;Guarding;Moaning;Pressure Pain Intervention(s): Limited activity within patient's tolerance;Monitored during session;Repositioned  Home Living  Prior Functioning/Environment              Frequency  Min 2X/week        Progress Toward Goals  OT Goals(current goals can now be found in the care plan section)  Progress towards OT goals: Progressing toward goals     Plan      Co-evaluation                 AM-PAC OT "6 Clicks" Daily Activity     Outcome Measure   Help from another person eating meals?: None Help from another person taking care of personal grooming?: A Little Help from another person toileting, which includes using toliet, bedpan, or urinal?: Total Help from another person bathing  (including washing, rinsing, drying)?: A Lot Help from another person to put on and taking off regular upper body clothing?: A Little Help from another person to put on and taking off regular lower body clothing?: Total 6 Click Score: 14    End of Session    OT Visit Diagnosis: Muscle weakness (generalized) (M62.81);Pain;Other abnormalities of gait and mobility (R26.89) Pain - Right/Left: Left Pain - part of body: Leg   Activity Tolerance Patient limited by fatigue;Patient limited by pain   Patient Left in bed;with call bell/phone within reach   Nurse Communication          Time: 2774-1287 OT Time Calculation (min): 39 min  Charges: OT General Charges $OT Visit: 1 Visit OT Treatments $Self Care/Home Management : 23-37 mins $Therapeutic Activity: 8-22 mins     Erika, Fry 01/21/2019, 12:28 PM

## 2019-01-21 NOTE — Progress Notes (Addendum)
PROGRESS NOTE  NAZANIN KINNER  XLK:[id number] DOB: 1945-12-21 DOA: 01/16/2019 PCP: [name], MD   Brief Narrative: 73 y.o.femalewith medical history significantfor stage IV CKD, hypertension, diabetes, L4-L5 discitis, with recent discharge from urology service who presented for poor per oral intake, pelvic pain, lethargy. Work up demonstrated BUN of 99, and creatinine of 6, with pyuria on UA and dysuria. Ceftriaxone was started and nephrology was consulted.  Acute kidney injury improving.  Ongoing chronic pelvic pain.  ID consulted for FUO.  Assessment & Plan: Principal Problem:   Fever Active Problems:   Aortic stenosis   CKD (chronic kidney disease), stage IV (HCC)   Anemia of chronic disease   Benign essential HTN   Acute lower UTI   Pressure injury of skin  Fever: With strongly positive UA and typical symptoms ceftriaxone given but negative urine culture. Continued to have fevers despite abx. - Has completed expected course of ceftriaxone. - Due to ongoing fevers, ID consulted. -CRP 20, ESR 98, CT abdomen and pelvis without contrast 2/18: L4-5 discitis/osteomyelitis similar to prior lumbar MRI, probable gallstones without cholecystitis, right ureteral stent with mild right hydronephrosis and hydroureter. -Orthopedics performed left knee diagnostic arthrocentesis: Synovial fluid showed no crystals, WBCs 255, 61% neutrophils, rest lymphocytes and monocytes. -As per ID, left knee septic arthritis not completely ruled out due to being on antibiotics.  Hence MRI left knee 2/20: No discrete evidence of soft tissue infection about the knee, medial meniscus tear and tricompartmental osteoarthritis. -ID repeating blood cultures and continue to hold anti-biotics in the absence of clear source.  Left knee pain Unclear etiology.  Management as noted above.  AKI on stage IV CKD:  As per nephrology signed off 2/19, patient's recent baseline creatinine was progressing and was 5.3  prior to urological procedures.  She follows with Dr. Johnney Ou at [place]. -Acute kidney injury was felt due to dehydration and obstructive etiology/hydronephrosis. -Post IV fluid hydration and right ureteral stent placement and lithotripsy on 01/14/2019, creatinine has steadily improved to 2.58.  Continue to follow BMP. -Outpatient follow-up with Dr. Johnney Ou, nephrology will arrange.  NAGMA: Changed to LR -Improved.  Acute metabolic encephalopathy: Possibly due to uremia vs. FUO.  -Resolved.  Anemia of chronic disease:  -Hemoglobin stable in the 7.5-8 range for the last several days.  Transfuse if hemoglobin 7 or less.  No bleeding reported.  Continue ferrous sulfate, B12 and folate supplements.  Benign essential HTN:  - Continue home medications.  Mildly controlled at times.  Stage I pressure injury bilateral buttocks. Not known POA status.  - Offload as able.  Obesity/Body mass index is 30.74 kg/m. - Weight loss long term recommended.  Chronic pelvic pain Status post cystoscopy, hydrodistention, right ureteral stent placement and lithotripsy by Dr. Louis Meckel, urology on 01/14/2018.  Ongoing severe PV pain and patient states "I want the stent out".  I discussed with Dr. Louis Meckel, DC Foley to see if that will help her pain.  Urine greenish discoloration is from [place].  Continue Myrbetriq, Toviaz and [employer].  L4-5 discitis/osteomyelitis Has completed course of IV followed by oral antibiotics.  Management per ID.  DM2 A1c 6.3 on 04/30/2018.  Does not appear that she was on medications PTA/possibly diet controlled.  Monitor CBGs and consider SSI if needed.  GERD Continue Pepcid.  Hypothyroid Continue Synthroid.  S/p PPM.  Hypokalemia: Replace and follow.  DVT prophylaxis: Subcutaneous heparin. Code Status: Full Family Communication: None at bedside Disposition Plan: DC to SNF pending clinical improvement.  Not medically  ready for DC.  Consultants:    Nephrology  Urology  Orthopedics  Procedures:   Foley catheter-discontinued 2/20  Antimicrobials:  Ceftriaxone-discontinued  Subjective: Reports 10/10 vaginal pain, feels like a mass sitting there.  No vaginal discharge or bleeding reported.  No back pain reported.  Left knee pain better, none when lying still, worse with movement.  Objective: Vitals:   01/20/19 2133 01/21/19 0459 01/21/19 1021 01/21/19 1708  BP: 135/77 (!) 144/77 139/67 (!) 161/99  Pulse: (!) 106 92 82 (!) 106  Resp: 18 18 18 20   Temp: 99.8 F (37.7 C) 98.7 F (37.1 C) 98.5 F (36.9 C) 99.1 F (37.3 C)  TempSrc: Oral Oral Oral Oral  SpO2: 98% 100% 100% 100%  Weight: 83.8 kg     Height:        Intake/Output Summary (Last 24 hours) at 01/21/2019 1741 Last data filed at 01/21/2019 1115 Gross per 24 hour  Intake 2805.58 ml  Output 2151 ml  Net 654.58 ml   Filed Weights   01/18/19 1820 01/19/19 2028 01/20/19 2133  Weight: 88.9 kg 83.8 kg 83.8 kg   Gen: Pleasant elderly female, moderately built and obese lying uncomfortably propped up in bed. Pulm: Clear to auscultation.  No increased work of breathing. CV: S1 and S2 heard, RRR.  No JVD, murmurs or pedal edema.  GI: Abdomen is nondistended, soft, suprapubic tenderness without rigidity, guarding or rebound.  Foley catheter in place draining light green-colored urine. Ext: No lower extremity edema.  Left knee without swelling, redness, warmth or tenderness.  However has painful range of movements. Skin: No new rashes, lesions or ulcers on visualized skin. Neuro: Alert and oriented.  No focal neurological deficits. Psych: Judgement and insight appear fair. Mood euthymic & affect congruent. Behavior is appropriate.    Data Reviewed: I have personally reviewed following labs and imaging studies  CBC: Recent Labs  Lab 01/16/19 2241 01/18/19 0416 01/19/19 0456 01/20/19 0356  WBC 10.6* 7.5 6.5 8.3  NEUTROABS 8.2*  --   --   --   HGB 8.2* 7.5*  7.6* 8.0*  HCT 26.7* 23.5* 23.0* 24.3*  MCV 98.5 93.6 91.3 90.7  PLT 173 164 174 174   Basic Metabolic Panel: Recent Labs  Lab 01/16/19 2241 01/18/19 0416 01/19/19 0456 01/20/19 0356 01/21/19 0759  NA 135 139 134* 133* 133*  K 3.9 3.4* 3.2* 3.1* 3.2*  CL 102 109 102 101 103  CO2 19* 17* 18* 19* 20*  GLUCOSE 183* 120* 152* 118* 115*  BUN 99* 77* 58* 47* 39*  CREATININE 6.00* 4.37* 3.35* 2.87* 2.58*  CALCIUM 9.6 8.7* 9.1 9.1 8.8*  PHOS  --   --  3.7  --   --    GFR: Estimated Creatinine Clearance: 20.4 mL/min (A) (by C-G formula based on SCr of 2.58 mg/dL (H)). Liver Function Tests: Recent Labs  Lab 01/16/19 2241 01/19/19 0456  AST 14*  --   ALT 5  --   ALKPHOS 89  --   BILITOT 0.7  --   PROT 6.7  --   ALBUMIN 3.2* 2.2*   Recent Labs  Lab 01/16/19 2241  LIPASE 26   CBG: Recent Labs  Lab 01/16/19 2334  GLUCAP 162*   Urine analysis:    Component Value Date/Time   COLORURINE YELLOW 01/20/2019 0649   APPEARANCEUR TURBID (A) 01/20/2019 0649   LABSPEC 1.008 01/20/2019 0649   PHURINE 5.0 01/20/2019 0649   GLUCOSEU NEGATIVE 01/20/2019 0649   HGBUR MODERATE (A)  01/20/2019 [place] 01/20/2019 0649   KETONESUR 5 (A) 01/20/2019 0649   PROTEINUR 100 (A) 01/20/2019 0649   NITRITE NEGATIVE 01/20/2019 0649   LEUKOCYTESUR LARGE (A) 01/20/2019 0649   Recent Results (from the past 240 hour(s))  Urine culture     Status: None   Collection Time: 01/16/19  9:51 PM  Result Value Ref Range Status   Specimen Description   Final    URINE, RANDOM Performed at [facility], [place], [place] [address] [place], [place] [address]    Special Requests   Final    NONE Performed at [facility], [place] [address] [place], [place] [address]    Culture   Final    NO GROWTH Performed at [facility], [place] [address] [place], [place] [address]    Report Status 01/18/2019 FINAL  Final  Culture, blood (routine x 2)     Status:  None (Preliminary result)   Collection Time: 01/16/19 10:41 PM  Result Value Ref Range Status   Specimen Description   Final    BLOOD LEFT ANTECUBITAL Performed at [place] [address] [place], [place] [address]    Special Requests   Final    BOTTLES DRAWN AEROBIC AND ANAEROBIC Blood Culture adequate volume Performed at [place] [address] [place], [place] [address]    Culture   Final    NO GROWTH 4 DAYS Performed at [facility], [place] [address] [place], [place] [address]    Report Status PENDING  Incomplete  Respiratory Panel by PCR     Status: None   Collection Time: 01/17/19 12:23 AM  Result Value Ref Range Status   Adenovirus NOT DETECTED NOT DETECTED Final   Coronavirus 229E NOT DETECTED NOT DETECTED Final    Comment: (NOTE) The Coronavirus on the Respiratory Panel, DOES NOT test for the novel  Coronavirus (2019 nCoV)    Coronavirus HKU1 NOT DETECTED NOT DETECTED Final   Coronavirus NL63 NOT DETECTED NOT DETECTED Final   Coronavirus OC43 NOT DETECTED NOT DETECTED Final   Metapneumovirus NOT DETECTED NOT DETECTED Final   Rhinovirus / Enterovirus NOT DETECTED NOT DETECTED Final   Influenza A NOT DETECTED NOT DETECTED Final   Influenza B NOT DETECTED NOT DETECTED Final   Parainfluenza Virus 1 NOT DETECTED NOT DETECTED Final   Parainfluenza Virus 2 NOT DETECTED NOT DETECTED Final   Parainfluenza Virus 3 NOT DETECTED NOT DETECTED Final   Parainfluenza Virus 4 NOT DETECTED NOT DETECTED Final   Respiratory Syncytial Virus NOT DETECTED NOT DETECTED Final   Bordetella pertussis NOT DETECTED NOT DETECTED Final   Chlamydophila pneumoniae NOT DETECTED NOT DETECTED Final   Mycoplasma pneumoniae NOT DETECTED NOT DETECTED Final    Comment: Performed at [facility], [address], [place], [place] [address]  Culture, blood (Routine X 2) w Reflex to ID Panel     Status: None (Preliminary result)   Collection Time:  01/19/19 10:50 PM  Result Value Ref Range Status   Specimen Description BLOOD RIGHT HAND  Final   Special Requests   Final    BOTTLES DRAWN AEROBIC ONLY Blood Culture adequate volume   Culture   Final    NO GROWTH 1 DAY Performed at [facility], [place] [address], [place], [place] [address]    Report Status PENDING  Incomplete  Culture, blood (Routine X 2) w Reflex to ID Panel  Status: None (Preliminary result)   Collection Time: 01/19/19 10:50 PM  Result Value Ref Range Status   Specimen Description BLOOD RIGHT ANTECUBITAL  Final   Special Requests   Final    BOTTLES DRAWN AEROBIC AND ANAEROBIC Blood Culture adequate volume   Culture   Final    NO GROWTH 1 DAY Performed at [facility], [address] [place], [place] [address]    Report Status PENDING  Incomplete  Body fluid culture     Status: None (Preliminary result)   Collection Time: 01/20/19 10:35 AM  Result Value Ref Range Status   Specimen Description SYNOVIAL LEFT KNEE  Final   Special Requests NONE  Final   Gram Stain   Final    RARE WBC PRESENT, PREDOMINANTLY PMN NO ORGANISMS SEEN    Culture   Final    NO GROWTH < 24 HOURS Performed at [facility], [place] [address] [place], [place] [address]    Report Status PENDING  Incomplete      Radiology Studies: Mr Knee Left Wo Contrast  Result Date: 01/21/2019 CLINICAL DATA:  Fever of unknown origin. Severe knee pain. Negative left knee aspiration yesterday. EXAM: MRI OF THE LEFT KNEE WITHOUT CONTRAST TECHNIQUE: Multiplanar, multisequence MR imaging of the knee was performed. No intravenous contrast was administered. COMPARISON:  Left knee x-rays dated January 20, 2019. FINDINGS: MENISCI Medial meniscus: Longitudinal tear of the posterior horn. Radial tear of the body. Lateral meniscus:  Intact. LIGAMENTS Cruciates:  Intact ACL and PCL. Collaterals: Medial collateral ligament is intact. Lateral collateral ligament complex is intact. CARTILAGE  Patellofemoral: Diffuse moderate to high-grade partial-thickness cartilage loss. Medial: Large areas of full-thickness cartilage loss over the weight-bearing medial femoral condyle and medial tibial plateau. Lateral: Partial-thickness cartilage loss along the mesial aspect of the lateral compartment. Joint: Small joint effusion. Normal Hoffa's fat. No plical thickening. Popliteal Fossa:  No Baker cyst. Intact popliteus tendon. Extensor Mechanism: Intact quadriceps tendon and patellar tendon. Intact medial and lateral patellar retinaculum. Intact MPFL. Bones: No acute fracture or dislocation. No focal bone lesion. Tricompartmental osteophytes. Degenerative subchondral marrow edema in the peripheral medial femoral condyle and medial tibial plateau. Other: Mild soft tissue swelling about the knee. No fluid collection. No muscle edema. IMPRESSION: 1. No discrete evidence of soft tissue infection about the knee. 2. Longitudinal tear of the medial meniscus posterior horn. Radial tear of the medial meniscus body. 3. Tricompartmental osteoarthritis, moderate in the medial and patellofemoral compartments. Electronically Signed   By: Titus Dubin M.D.   On: 01/21/2019 15:23   Dg Knee Complete 4 Views Left  Result Date: 01/20/2019 CLINICAL DATA:  Severe LEFT knee pain for awhile, fluid drained from LEFT knee this morning, persistent pain especially with any movement, no known injury EXAM: LEFT KNEE - COMPLETE 4+ VIEW COMPARISON:  None FINDINGS: Osseous demineralization. Joint space narrowing and spur formation greatest at medial compartment. No acute fracture, dislocation, or bone destruction. Scattered soft tissue swelling. Small metallic foreign body at the medial soft tissues of the distal thigh. No significant joint effusion. IMPRESSION: Osteoarthritic changes and osseous demineralization of the LEFT knee. No acute abnormalities. Electronically Signed   By: Lavonia Dana M.D.   On: 01/20/2019 16:15    Scheduled  Meds: . famotidine  10 mg Oral Daily  . ferrous sulfate  325 mg Oral BID WC  . fesoterodine  4 mg Oral QHS  . folic acid  1 mg Oral Daily  . heparin injection (  subcutaneous)  5,000 Units Subcutaneous Q8H  . levothyroxine  75 mcg Oral QAC breakfast  . mirabegron ER  50 mg Oral Daily  . vitamin B-12  1,000 mcg Oral Daily   Continuous Infusions: . lactated ringers 100 mL/hr at 01/21/19 0021     LOS: 4 days   Vernell Leep, MD, FACP, [facility]. [facility]  To contact the attending provider between 7A-7P or the covering provider during after hours 7P-7A, please log into the web site [email or web address] and access using universal [place] password for that web site. If you do not have the password, please call the hospital operator.

## 2019-01-22 ENCOUNTER — Inpatient Hospital Stay (HOSPITAL_COMMUNITY): Payer: Medicare Other

## 2019-01-22 DIAGNOSIS — Z45018 Encounter for adjustment and management of other part of cardiac pacemaker: Secondary | ICD-10-CM

## 2019-01-22 DIAGNOSIS — I441 Atrioventricular block, second degree: Secondary | ICD-10-CM

## 2019-01-22 DIAGNOSIS — Z950 Presence of cardiac pacemaker: Secondary | ICD-10-CM

## 2019-01-22 LAB — GLUCOSE, CAPILLARY
Glucose-Capillary: 110 mg/dL — ABNORMAL HIGH (ref 70–99)
Glucose-Capillary: 117 mg/dL — ABNORMAL HIGH (ref 70–99)
Glucose-Capillary: 128 mg/dL — ABNORMAL HIGH (ref 70–99)
Glucose-Capillary: 150 mg/dL — ABNORMAL HIGH (ref 70–99)

## 2019-01-22 LAB — BASIC METABOLIC PANEL
Anion gap: 14 (ref 5–15)
BUN: 43 mg/dL — ABNORMAL HIGH (ref 8–23)
CALCIUM: 9.1 mg/dL (ref 8.9–10.3)
CO2: 18 mmol/L — ABNORMAL LOW (ref 22–32)
Chloride: 103 mmol/L (ref 98–111)
Creatinine, Ser: 2.96 mg/dL — ABNORMAL HIGH (ref 0.44–1.00)
GFR calc Af Amer: 17 mL/min — ABNORMAL LOW (ref >60)
GFR calc non Af Amer: 15 mL/min — ABNORMAL LOW (ref >60)
Glucose, Bld: 145 mg/dL — ABNORMAL HIGH (ref 70–99)
Potassium: 4 mmol/L (ref 3.5–5.1)
SODIUM: 135 mmol/L (ref 135–145)

## 2019-01-22 LAB — CULTURE, BLOOD (ROUTINE X 2)
CULTURE: NO GROWTH
SPECIAL REQUESTS: ADEQUATE

## 2019-01-22 MED ORDER — SENNA 8.6 MG PO TABS
1.0000 | ORAL_TABLET | Freq: Every day | ORAL | Status: DC
  Administered 2019-01-22 – 2019-01-29 (×8): 8.6 mg via ORAL
  Filled 2019-01-22 (×8): qty 1

## 2019-01-22 MED ORDER — POLYETHYLENE GLYCOL 3350 17 G PO PACK
17.0000 g | PACK | Freq: Every day | ORAL | Status: DC
  Administered 2019-01-22: 17 g via ORAL
  Filled 2019-01-22 (×4): qty 1

## 2019-01-22 NOTE — Progress Notes (Signed)
Subjective:  She continues to have fevers though she has much less pelvic pain now that her Foley catheter is been removed her knee pain is also improving  Antibiotics:  Anti-infectives (From admission, onward)   Start     Dose/Rate Route Frequency Ordered Stop   01/17/19 2200  cefTRIAXone (ROCEPHIN) 1 g in sodium chloride 0.9 % 100 mL IVPB  Status:  Discontinued     1 g 200 mL/hr over 30 Minutes Intravenous Every 24 hours 01/17/19 0054 01/19/19 0914   01/16/19 2345  cefTRIAXone (ROCEPHIN) 1 g in sodium chloride 0.9 % 100 mL IVPB     1 g 200 mL/hr over 30 Minutes Intravenous  Once 01/16/19 2330 01/17/19 0055      Medications: Scheduled Meds: . famotidine  10 mg Oral Daily  . ferrous sulfate  325 mg Oral BID WC  . fesoterodine  4 mg Oral QHS  . folic acid  1 mg Oral Daily  . heparin injection (subcutaneous)  5,000 Units Subcutaneous Q8H  . levothyroxine  75 mcg Oral QAC breakfast  . mirabegron ER  50 mg Oral Daily  . potassium chloride  40 mEq Oral Once  . vitamin B-12  1,000 mcg Oral Daily   Continuous Infusions: . lactated ringers 100 mL/hr at 01/21/19 2112   PRN Meds:.acetaminophen **OR** acetaminophen, fluticasone, lidocaine, URELLE    Objective: Weight change:   Intake/Output Summary (Last 24 hours) at 01/22/2019 1017 Last data filed at 01/22/2019 0654 Gross per 24 hour  Intake 1593.61 ml  Output 1650 ml  Net -56.39 ml   Blood pressure 123/83, pulse 79, temperature (!) 97.5 F (36.4 C), temperature source Oral, resp. rate 18, height 5\' 5"  (1.651 m), weight 83.8 kg, SpO2 100 %. Temp:  [97.4 F (36.3 C)-102.9 F (39.4 C)] 97.5 F (36.4 C) (02/21 1013) Pulse Rate:  [79-113] 79 (02/21 1013) Resp:  [16-24] 18 (02/21 1013) BP: (108-161)/(58-99) 123/83 (02/21 1013) SpO2:  [95 %-100 %] 100 % (02/21 1013)  Physical Exam: General: Alert and much more awake and oriented and in less discomfort HEENT: anicteric sclera, EOMI CVS regular rate, normal  No  mgr Chest: , no wheezing, no respiratory distress CTAG Abdomen: Tender to palpation diffusely at times seems out of proportion  Extremities: Left knee is much less tender Skin: no rashes Neuro: nonfocal  CBC:    BMET Recent Labs    01/21/19 0759 01/22/19 0634  NA 133* 135  K 3.2* 4.0  CL 103 103  CO2 20* 18*  GLUCOSE 115* 145*  BUN 39* 43*  CREATININE 2.58* 2.96*  CALCIUM 8.8* 9.1     Liver Panel  No results for input(s): PROT, ALBUMIN, AST, ALT, ALKPHOS, BILITOT, BILIDIR, IBILI in the last 72 hours.     Sedimentation Rate Recent Labs    01/20/19 0356  ESRSEDRATE 98*   C-Reactive Protein Recent Labs    01/20/19 0356  CRP 20.0*    Micro Results: Recent Results (from the past 720 hour(s))  Urine culture     Status: None   Collection Time: 01/16/19  9:51 PM  Result Value Ref Range Status   Specimen Description   Final    URINE, RANDOM Performed at [facility], [place] [address], [place], [place] [address]    Special Requests   Final    NONE Performed at [facility], [place] [address], [place], [place] [address]    Culture   Final  NO GROWTH Performed at [facility], [place] [address] [place], [place] [address]    Report Status 01/18/2019 FINAL  Final  Culture, blood (routine x 2)     Status: None   Collection Time: 01/16/19 10:41 PM  Result Value Ref Range Status   Specimen Description BLOOD LEFT ANTECUBITAL  Final   Special Requests   Final    BOTTLES DRAWN AEROBIC AND ANAEROBIC Blood Culture adequate volume Performed at [facility] [place] [address], [place], [place] [address]    Culture NO GROWTH 5 DAYS  Final   Report Status 01/22/2019 FINAL  Final  Respiratory Panel by PCR     Status: None   Collection Time: 01/17/19 12:23 AM  Result Value Ref Range Status   Adenovirus NOT DETECTED NOT DETECTED Final   Coronavirus 229E NOT DETECTED NOT DETECTED Final    Comment: (NOTE) The  Coronavirus on the Respiratory Panel, DOES NOT test for the novel  Coronavirus (2019 nCoV)    Coronavirus HKU1 NOT DETECTED NOT DETECTED Final   Coronavirus NL63 NOT DETECTED NOT DETECTED Final   Coronavirus OC43 NOT DETECTED NOT DETECTED Final   Metapneumovirus NOT DETECTED NOT DETECTED Final   Rhinovirus / Enterovirus NOT DETECTED NOT DETECTED Final   Influenza A NOT DETECTED NOT DETECTED Final   Influenza B NOT DETECTED NOT DETECTED Final   Parainfluenza Virus 1 NOT DETECTED NOT DETECTED Final   Parainfluenza Virus 2 NOT DETECTED NOT DETECTED Final   Parainfluenza Virus 3 NOT DETECTED NOT DETECTED Final   Parainfluenza Virus 4 NOT DETECTED NOT DETECTED Final   Respiratory Syncytial Virus NOT DETECTED NOT DETECTED Final   Bordetella pertussis NOT DETECTED NOT DETECTED Final   Chlamydophila pneumoniae NOT DETECTED NOT DETECTED Final   Mycoplasma pneumoniae NOT DETECTED NOT DETECTED Final    Comment: Performed at [facility], [place] [address] [place], [place] [address]  Culture, blood (Routine X 2) w Reflex to ID Panel     Status: None (Preliminary result)   Collection Time: 01/19/19 10:50 PM  Result Value Ref Range Status   Specimen Description BLOOD RIGHT HAND  Final   Special Requests   Final    BOTTLES DRAWN AEROBIC ONLY Blood Culture adequate volume Performed at [place] [address] [place], [place] [address]    Culture NO GROWTH 2 DAYS  Final   Report Status PENDING  Incomplete  Culture, blood (Routine X 2) w Reflex to ID Panel     Status: None (Preliminary result)   Collection Time: 01/19/19 10:50 PM  Result Value Ref Range Status   Specimen Description BLOOD RIGHT ANTECUBITAL  Final   Special Requests   Final    BOTTLES DRAWN AEROBIC AND ANAEROBIC Blood Culture adequate volume Performed at [facility], [place] [address], [place], [place] [address]    Culture NO GROWTH 2 DAYS  Final   Report Status PENDING  Incomplete  Body fluid culture      Status: None (Preliminary result)   Collection Time: 01/20/19 10:35 AM  Result Value Ref Range Status   Specimen Description SYNOVIAL LEFT KNEE  Final   Special Requests NONE  Final   Gram Stain   Final    RARE WBC PRESENT, PREDOMINANTLY PMN NO ORGANISMS SEEN Performed at [facility], [place] [address], [place], [place] [address]    Culture NO GROWTH 2 DAYS  Final   Report Status PENDING  Incomplete  Studies/Results: Mr Knee Left Wo Contrast  Result Date: 01/21/2019 CLINICAL DATA:  Fever of unknown origin. Severe knee pain. Negative left knee aspiration yesterday. EXAM: MRI OF THE LEFT KNEE WITHOUT CONTRAST TECHNIQUE: Multiplanar, multisequence MR imaging of the knee was performed. No intravenous contrast was administered. COMPARISON:  Left knee x-rays dated January 20, 2019. FINDINGS: MENISCI Medial meniscus: Longitudinal tear of the posterior horn. Radial tear of the body. Lateral meniscus:  Intact. LIGAMENTS Cruciates:  Intact ACL and PCL. Collaterals: Medial collateral ligament is intact. Lateral collateral ligament complex is intact. CARTILAGE Patellofemoral: Diffuse moderate to high-grade partial-thickness cartilage loss. Medial: Large areas of full-thickness cartilage loss over the weight-bearing medial femoral condyle and medial tibial plateau. Lateral: Partial-thickness cartilage loss along the mesial aspect of the lateral compartment. Joint: Small joint effusion. Normal Hoffa's fat. No plical thickening. Popliteal Fossa:  No Baker cyst. Intact popliteus tendon. Extensor Mechanism: Intact quadriceps tendon and patellar tendon. Intact medial and lateral patellar retinaculum. Intact MPFL. Bones: No acute fracture or dislocation. No focal bone lesion. Tricompartmental osteophytes. Degenerative subchondral marrow edema in the peripheral medial femoral condyle and medial tibial plateau. Other: Mild soft tissue swelling about the knee. No fluid collection. No muscle edema. IMPRESSION: 1.  No discrete evidence of soft tissue infection about the knee. 2. Longitudinal tear of the medial meniscus posterior horn. Radial tear of the medial meniscus body. 3. Tricompartmental osteoarthritis, moderate in the medial and patellofemoral compartments. Electronically Signed   By: Titus Dubin M.D.   On: 01/21/2019 15:23   Dg Knee Complete 4 Views Left  Result Date: 01/20/2019 CLINICAL DATA:  Severe LEFT knee pain for awhile, fluid drained from LEFT knee this morning, persistent pain especially with any movement, no known injury EXAM: LEFT KNEE - COMPLETE 4+ VIEW COMPARISON:  None FINDINGS: Osseous demineralization. Joint space narrowing and spur formation greatest at medial compartment. No acute fracture, dislocation, or bone destruction. Scattered soft tissue swelling. Small metallic foreign body at the medial soft tissues of the distal thigh. No significant joint effusion. IMPRESSION: Osteoarthritic changes and osseous demineralization of the LEFT knee. No acute abnormalities. Electronically Signed   By: Lavonia Dana M.D.   On: 01/20/2019 16:15      Assessment/Plan:  INTERVAL HISTORY:   She continues to have fevers  Principal Problem:   Fever Active Problems:   Aortic stenosis   CKD (chronic kidney disease), stage IV (HCC)   Anemia of chronic disease   Benign essential HTN   Acute lower UTI   Pressure injury of skin    Erika Fry is a 73 y.o. female with history of lumbar discitis treated with IV antibiotics followed by oral antibiotics with pelvic pain that was worked up by urology with cystoscopy placement of ureteral stent and biopsies who is continue to complain of pelvic pain but then admitted the hospital with altered mental status and fever without clear cause.  1.  Fever of unknown origin:   Cell count from the knee was not consistent with a negative septic arthritis in my book though she was receiving antecedent antimicrobials which could have potentially changed  cell count and differential I have typically not seen it get this low this quickly.  MRI of the knee was unrevealing  In talking her today we found out that she is seems to have been given what sounds like fosfomycin by her urologist and she was taking this prior to admission.  This could have compromised the urine cultures but not been sufficient  enough to treat infection above the level of the bladder.  I did send urine cultures last night and we will follow-up on those.  I also ordered an MRI of the spine but that needs to be coordinated with her pacer  Dr. Johnnye Sima will check in on her over the weekend.      LOS: 5 days   Alcide Evener 01/22/2019, 10:17 AM

## 2019-01-22 NOTE — Progress Notes (Signed)
Physical Therapy Treatment Patient Details Name: Erika Fry MRN: [id number] DOB: 07-Sep-1946 Today's Date: 01/22/2019    History of Present Illness Pt admitted with UTI and fever; recent DC from urology. PMH of lumbar spine osteomyelitis/discitis, CKD, HTN, DM, pacemaker.    PT Comments    Pt eager to get up and try mobility today.  Pt less concerned about her left knee today and more able pain from ?fecal impaction.  Emphasis on warm up exercise, transition to EOB, sit to stand, transfers and short distance gait with the RW.   Follow Up Recommendations  SNF     Equipment Recommendations  Other (comment)(TBA)    Recommendations for Other Services       Precautions / Restrictions Precautions Precautions: Fall    Mobility  Bed Mobility Overal bed mobility: Needs Assistance Bed Mobility: Supine to Sit     Supine to sit: Mod assist;+2 for physical assistance     General bed mobility comments: increased time.  minimal assist for LE's to EOB and moderate to bring trunk up and forward.  Transfers Overall transfer level: Needs assistance   Transfers: Sit to/from Stand;Stand Pivot Transfers Sit to Stand: Mod assist;+2 safety/equipment Stand pivot transfers: Mod assist;+2 safety/equipment       General transfer comment: cues for hand placement  Ambulation/Gait Ambulation/Gait assistance: Min assist Gait Distance (Feet): 8 Feet Assistive device: Rolling walker (2 wheeled) Gait Pattern/deviations: Step-through pattern     General Gait Details: weak kneed gait with some sagging as she fatigued,   Stairs             Wheelchair Mobility    Modified Rankin (Stroke Patients Only)       Balance Overall balance assessment: Needs assistance Sitting-balance support: Bilateral upper extremity supported;Feet supported Sitting balance-Leahy Scale: Fair     Standing balance support: Bilateral upper extremity supported Standing balance-Leahy Scale:  Poor Standing balance comment: reliance on the AD                            Cognition Arousal/Alertness: Awake/alert Behavior During Therapy: WFL for tasks assessed/performed Overall Cognitive Status: Within Functional Limits for tasks assessed                                        Exercises Other Exercises Other Exercises: warm up hip/knee flexion/ext exercise x10 reps prior to mobility    General Comments        Pertinent Vitals/Pain Pain Assessment: Faces Faces Pain Scale: Hurts even more Pain Location: [place]/perineal Pain Descriptors / Indicators: Grimacing;Moaning Pain Intervention(s): Monitored during session    Home Living                      Prior Function            PT Goals (current goals can now be found in the care plan section) Acute Rehab PT Goals Patient Stated Goal: get back to walking PT Goal Formulation: With patient Time For Goal Achievement: 02/02/19 Potential to Achieve Goals: Fair Progress towards PT goals: Progressing toward goals    Frequency    Min 2X/week      PT Plan Current plan remains appropriate    Co-evaluation              AM-PAC PT "6 Clicks" Mobility   Outcome Measure  Help needed turning from your back to your side while in a flat bed without using bedrails?: A Little Help needed moving from lying on your back to sitting on the side of a flat bed without using bedrails?: A Lot Help needed moving to and from a bed to a chair (including a wheelchair)?: A Lot Help needed standing up from a chair using your arms (e.g., wheelchair or bedside chair)?: A Lot Help needed to walk in hospital room?: A Little Help needed climbing 3-5 steps with a railing? : Total 6 Click Score: 13    End of Session Equipment Utilized During Treatment: Oxygen Activity Tolerance: Patient limited by fatigue;Patient limited by pain Patient left: in chair;with call bell/phone within reach;with chair  alarm set;with family/visitor present Nurse Communication: Mobility status PT Visit Diagnosis: Other abnormalities of gait and mobility (R26.89);Muscle weakness (generalized) (M62.81);Pain     Time: 8937-3428 PT Time Calculation (min) (ACUTE ONLY): 40 min  Charges:  $Gait Training: 8-22 mins $Therapeutic Exercise: 8-22 mins $Therapeutic Activity: 8-22 mins                     01/22/2019  Donnella Sham, PT Acute Rehabilitation Services [phone]  (pager) [phone]  (office)   Tessie Fass Mammie Meras 01/22/2019, 5:33 PM

## 2019-01-22 NOTE — Progress Notes (Signed)
PROGRESS NOTE  Erika Fry  TDV:[id number] DOB: October 24, 1946 DOA: 01/16/2019 PCP: [name], MD   Brief Narrative: 73 y.o.femalewith medical history significantfor stage IV CKD, hypertension, diabetes, L4-L5 discitis, with recent discharge from urology service who presented for poor per oral intake, pelvic pain, lethargy. Work up demonstrated BUN of 99, and creatinine of 6, with pyuria on UA and dysuria. Ceftriaxone was started and nephrology was consulted.  Acute kidney injury improving.  Ongoing chronic pelvic pain.  ID consulted for FUO.  Extensive work-up thus far nonrevealing and she continues to have fevers intermittently.  Assessment & Plan: Principal Problem:   Fever Active Problems:   Aortic stenosis   CKD (chronic kidney disease), stage IV (HCC)   Anemia of chronic disease   Benign essential HTN   Acute lower UTI   Pressure injury of skin  Fever:  - With strongly positive UA and typical symptoms ceftriaxone given but negative urine culture. Continued to have fevers despite abx. - Has completed expected course of ceftriaxone. - Due to ongoing fevers, ID consulted. -CRP 20, ESR 98, CT abdomen and pelvis without contrast 2/18: L4-5 discitis/osteomyelitis similar to prior lumbar MRI, probable gallstones without cholecystitis, right ureteral stent with mild right hydronephrosis and hydroureter. -Orthopedics performed left knee diagnostic arthrocentesis: Synovial fluid showed no crystals, WBCs 255, 61% neutrophils, rest lymphocytes and monocytes.  Synovial fluid culture negative to date. -As per ID, left knee septic arthritis not completely ruled out due to being on antibiotics.  Hence MRI left knee 2/20: No discrete evidence of soft tissue infection about the knee, medial meniscus tear and tricompartmental osteoarthritis.  Left knee pain has improved after arthrocentesis. -ID repeated blood cultures 2/18 x 2: Negative to date. -Lumbar spine MRI shows resolution of L4-5  discitis and osteomyelitis. -Had fever of 102.9 last night.  Remains off antibiotics as per ID guidance.  Left knee pain Unclear etiology.  Management as noted above.  Improving pain.  AKI on stage IV CKD:  As per nephrology signed off 2/19, patient's recent baseline creatinine was progressing and was 5.3 prior to urological procedures.  She follows with Dr. Johnney Ou at [facility]. -Acute kidney injury was felt due to dehydration and obstructive etiology/hydronephrosis. -Post IV fluid hydration and right ureteral stent placement and lithotripsy on 01/14/2019, creatinine has steadily improved to 2.58.  Continue to follow BMP. -Outpatient follow-up with Dr. Johnney Ou, nephrology will arrange. -Creatinine has crept up today from 2.58-2.96.  Remains on IV fluids.  Etiology not clear.  Foley removed yesterday.  Check bladder scan to make sure she does not have urinary retention.  Follow BMP in a.m.  NAGMA: Changed to LR -Bicarbonate hovering in the 18-20 range for the last few days.  Remains on IV LR.  Acute metabolic encephalopathy: Possibly due to uremia vs. FUO.  -Resolved.  Anemia of chronic disease:  -Hemoglobin stable in the 7.5-8 range for the last several days.  Transfuse if hemoglobin 7 or less.  No bleeding reported.  Continue ferrous sulfate, B12 and folate supplements.  Benign essential HTN:  - Continue home medications.  Mildly controlled at times.  Stage I pressure injury bilateral buttocks. Not known POA status.  - Offload as able.  Obesity/Body mass index is 30.74 kg/m. - Weight loss long term recommended.  Chronic pelvic pain Status post cystoscopy, hydrodistention, right ureteral stent placement and lithotripsy by Dr. Louis Meckel, urology on 01/14/2018.  Ongoing severe PV pain and patient states "I want the stent out".  I discussed with Dr. Louis Meckel  on 2/20, DC Foley to see if that will help her pain.  Urine greenish discoloration is from [place].  Continue Myrbetriq, Toviaz and  [employer]. Foley catheter discontinued on 2/20 and patient reports that her vaginal pain has resolved.  Now reports foreign body like sensation when she wants to defecate.?  Constipation.  Bowel regimen initiated.  L4-5 discitis/osteomyelitis Has completed course of IV followed by oral antibiotics.  Management per ID.  Lumbar spine MRI shows resolution.  DM2 A1c 6.3 on 04/30/2018.  Does not appear that she was on medications PTA/possibly diet controlled.  CBGs show good inpatient control.  GERD Continue Pepcid.  Hypothyroid Continue Synthroid.  S/p PPM.  Hypokalemia: Replaced.  DVT prophylaxis: Subcutaneous heparin. Code Status: Full Family Communication: Discussed extensively patient son, updated care and answer questions. Disposition Plan: DC to SNF pending clinical improvement.  Not medically ready for DC.  Consultants:   Nephrology  Urology  Orthopedics  Procedures:   Foley catheter-discontinued 2/20  Antimicrobials:  Ceftriaxone-discontinued  Subjective: Had fever overnight.  Vaginal pain resolved after removal of Foley catheter yesterday.  Ongoing suprapubic pain.  Reports sensation of "something there" while attempting to defecate.  Asking for MiraLAX.  Left knee pain improved.  No back pain reported.  Objective: Vitals:   01/21/19 2029 01/21/19 2234 01/22/19 0423 01/22/19 1013  BP: (!) 161/82 (!) 108/58 123/85 123/83  Pulse: (!) 113 (!) 102 97 79  Resp: (!) _0 Temp: (!) 102.9 F (39.4 C) 98.4 F (36.9 C) (!) 97.4 F (36.3 C) (!) 97.5 F (36.4 C)  TempSrc: Oral Oral Oral Oral  SpO2: 95% 97% 99% 100%  Weight:      Height:        Intake/Output Summary (Last 24 hours) at 01/22/2019 1621 Last data filed at 01/22/2019 1500 Gross per 24 hour  Intake 2633.61 ml  Output 1400 ml  Net 1233.61 ml   Filed Weights   01/18/19 1820 01/19/19 2028 01/20/19 2133  Weight: 88.9 kg 83.8 kg 83.8 kg   Gen: Pleasant elderly female, moderately built and obese  lying uncomfortably propped up in bed.  Oral mucosa moist. Pulm: Clear to auscultation.  No increased work of breathing.  Stable. CV: S1 and S2 heard, RRR.  No JVD, murmurs or pedal edema.  Stable. GI: Abdomen is nondistended, soft, suprapubic tenderness without rigidity, guarding or rebound.  No organomegaly or masses appreciated.  Normal bowel sounds heard. Ext: No lower extremity edema.  Left knee without swelling, redness, warmth or tenderness.  However has mild painful range of movements. Skin: No new rashes, lesions or ulcers on visualized skin. Neuro: Alert and oriented.  No focal neurological deficits.  Stable. Psych: Judgement and insight appear fair. Mood euthymic & affect congruent. Behavior is appropriate.    Data Reviewed: I have personally reviewed following labs and imaging studies  CBC: Recent Labs  Lab 01/16/19 2241 01/18/19 0416 01/19/19 0456 01/20/19 0356  WBC 10.6* 7.5 6.5 8.3  NEUTROABS 8.2*  --   --   --   HGB 8.2* 7.5* 7.6* 8.0*  HCT 26.7* 23.5* 23.0* 24.3*  MCV 98.5 93.6 91.3 90.7  PLT 173 164 174 803   Basic Metabolic Panel: Recent Labs  Lab 01/18/19 0416 01/19/19 0456 01/20/19 0356 01/21/19 0759 01/22/19 0634  NA 139 134* 133* 133* 135  K 3.4* 3.2* 3.1* 3.2* 4.0  CL 109 102 101 103 103  CO2 17* 18* 19* 20* 18*  GLUCOSE 120* 152* 118* 115* 145*  BUN 77* 58* 47* 39* 43*  CREATININE 4.37* 3.35* 2.87* 2.58* 2.96*  CALCIUM 8.7* 9.1 9.1 8.8* 9.1  PHOS  --  3.7  --   --   --    GFR: Estimated Creatinine Clearance: 17.8 mL/min (A) (by C-G formula based on SCr of 2.96 mg/dL (H)). Liver Function Tests: Recent Labs  Lab 01/16/19 2241 01/19/19 0456  AST 14*  --   ALT 5  --   ALKPHOS 89  --   BILITOT 0.7  --   PROT 6.7  --   ALBUMIN 3.2* 2.2*   Recent Labs  Lab 01/16/19 2241  LIPASE 26   CBG: Recent Labs  Lab 01/16/19 2334 01/22/19 0739 01/22/19 1134  GLUCAP 162* 110* 117*   Urine analysis:    Component Value Date/Time   COLORURINE  YELLOW 01/20/2019 0649   APPEARANCEUR TURBID (A) 01/20/2019 0649   LABSPEC 1.008 01/20/2019 0649   PHURINE 5.0 01/20/2019 0649   GLUCOSEU NEGATIVE 01/20/2019 0649   HGBUR MODERATE (A) 01/20/2019 0649   BILIRUBINUR NEGATIVE 01/20/2019 0649   KETONESUR 5 (A) 01/20/2019 0649   PROTEINUR 100 (A) 01/20/2019 0649   NITRITE NEGATIVE 01/20/2019 0649   LEUKOCYTESUR LARGE (A) 01/20/2019 0649   Recent Results (from the past 240 hour(s))  Urine culture     Status: None   Collection Time: 01/16/19  9:51 PM  Result Value Ref Range Status   Specimen Description   Final    URINE, RANDOM Performed at [facility], [place] [address] [place], [place] [address]    Special Requests   Final    NONE Performed at [facility], [place] [name] [place], [place] [address]    Culture   Final    NO GROWTH Performed at [name], [place] [address], [place], [place] [address]    Report Status 01/18/2019 FINAL  Final  Culture, blood (routine x 2)     Status: None   Collection Time: 01/16/19 10:41 PM  Result Value Ref Range Status   Specimen Description BLOOD LEFT ANTECUBITAL  Final   Special Requests   Final    BOTTLES DRAWN AEROBIC AND ANAEROBIC Blood Culture adequate volume Performed at [facility] [address], [place], [place] [address]    Culture NO GROWTH 5 DAYS  Final   Report Status 01/22/2019 FINAL  Final  Respiratory Panel by PCR     Status: None   Collection Time: 01/17/19 12:23 AM  Result Value Ref Range Status   Adenovirus NOT DETECTED NOT DETECTED Final   Coronavirus 229E NOT DETECTED NOT DETECTED Final    Comment: (NOTE) The Coronavirus on the Respiratory Panel, DOES NOT test for the novel  Coronavirus (2019 nCoV)    Coronavirus HKU1 NOT DETECTED NOT DETECTED Final   Coronavirus NL63 NOT DETECTED NOT DETECTED Final   Coronavirus OC43 NOT DETECTED NOT DETECTED Final   Metapneumovirus NOT DETECTED NOT DETECTED Final    Rhinovirus / Enterovirus NOT DETECTED NOT DETECTED Final   Influenza A NOT DETECTED NOT DETECTED Final   Influenza B NOT DETECTED NOT DETECTED Final   Parainfluenza Virus 1 NOT DETECTED NOT DETECTED Final   Parainfluenza Virus 2 NOT DETECTED NOT DETECTED Final   Parainfluenza Virus 3 NOT DETECTED NOT DETECTED Final   Parainfluenza Virus 4 NOT DETECTED NOT DETECTED Final   Respiratory Syncytial Virus NOT DETECTED NOT DETECTED Final   Bordetella pertussis NOT DETECTED NOT DETECTED Final   Chlamydophila pneumoniae  NOT DETECTED NOT DETECTED Final   Mycoplasma pneumoniae NOT DETECTED NOT DETECTED Final    Comment: Performed at [facility], [place] [address], [place], [place] [address]  Culture, blood (Routine X 2) w Reflex to ID Panel     Status: None (Preliminary result)   Collection Time: 01/19/19 10:50 PM  Result Value Ref Range Status   Specimen Description BLOOD RIGHT HAND  Final   Special Requests   Final    BOTTLES DRAWN AEROBIC ONLY Blood Culture adequate volume Performed at [place] [address], [place], [place] [address]    Culture NO GROWTH 2 DAYS  Final   Report Status PENDING  Incomplete  Culture, blood (Routine X 2) w Reflex to ID Panel     Status: None (Preliminary result)   Collection Time: 01/19/19 10:50 PM  Result Value Ref Range Status   Specimen Description BLOOD RIGHT ANTECUBITAL  Final   Special Requests   Final    BOTTLES DRAWN AEROBIC AND ANAEROBIC Blood Culture adequate volume Performed at [facility], [place] [address], [place], [place] [address]    Culture NO GROWTH 2 DAYS  Final   Report Status PENDING  Incomplete  Body fluid culture     Status: None (Preliminary result)   Collection Time: 01/20/19 10:35 AM  Result Value Ref Range Status   Specimen Description SYNOVIAL LEFT KNEE  Final   Special Requests NONE  Final   Gram Stain   Final    RARE WBC PRESENT, PREDOMINANTLY PMN NO ORGANISMS SEEN Performed at [facility],  [place] [address], [place], [place] [address]    Culture NO GROWTH 2 DAYS  Final   Report Status PENDING  Incomplete      Radiology Studies: Mr Lumbar Spine Wo Contrast  Result Date: 01/22/2019 CLINICAL DATA:  History of discitis/osteomyelitis at L4-5. EXAM: MRI LUMBAR SPINE WITHOUT CONTRAST TECHNIQUE: Multiplanar, multisequence MR imaging of the lumbar spine was performed. No intravenous contrast was administered. COMPARISON:  MRI lumbar spine since 05/13/2018 and 08/31/2018 FINDINGS: Examination is somewhat degraded by motion artifact, particularly the axial images. Segmentation: There are five lumbar type vertebral bodies. The last full intervertebral disc space is labeled L5-S1. This correlates with the prior MRIs. Alignment:  Normal Vertebrae: Resolution of the diskitis and osteomyelitis at L4-5. No residual marrow edema or paraspinal process. No findings for epidural abscess. Chronic endplate reactive changes at L4-5. Conus medullaris and cauda equina: Conus extends to the L1 level. Conus and cauda equina appear normal. Paraspinal and other soft tissues: No signal paraspinal findings. No psoas muscle abscess or myositis. Persistent extrarenal pelvis on the right side. No adenopathy or aortic aneurysm. Disc levels: T12-L1: Stable small left paracentral disc extrusion with minimal impression on the left side of thecal sac. L1-2: Diffuse annular bulge and osteophytic ridging with mild bilateral lateral recess encroachment. There is also a persistent broad-based right foraminal, extraforaminal and far lateral disc protrusion. Possibly irritating the right L1 nerve root. L2-3: Diffuse bulging annulus with mild bilateral lateral recess stenosis, left greater than right. There is also mild left foraminal stenosis. L3-4: Diffuse bulging annulus and mild facet disease contributing to moderate bilateral lateral recess stenosis. Small focal extraforaminal disc protrusion on the right comes close to contacting the right  L3 nerve root. This appears stable. L4-5: Collapsed disc space related to prior diskitis and osteomyelitis. No findings for residual infection or inflammation. Degenerated bulging annulus but no spinal or significant lateral  recess stenosis. There is mild persistent foraminal and extraforaminal encroachment on the right L4 nerve root. Moderate facet disease. L5-S1: No significant findings. IMPRESSION: 1. Resolution of diskitis and osteomyelitis at L4-5. 2. Stable multilevel disc protrusions with lateral recess and foraminal stenosis as detailed above. No new or progressive findings. Electronically Signed   By: Marijo Sanes M.D.   On: 01/22/2019 11:32   Mr Knee Left Wo Contrast  Result Date: 01/21/2019 CLINICAL DATA:  Fever of unknown origin. Severe knee pain. Negative left knee aspiration yesterday. EXAM: MRI OF THE LEFT KNEE WITHOUT CONTRAST TECHNIQUE: Multiplanar, multisequence MR imaging of the knee was performed. No intravenous contrast was administered. COMPARISON:  Left knee x-rays dated January 20, 2019. FINDINGS: MENISCI Medial meniscus: Longitudinal tear of the posterior horn. Radial tear of the body. Lateral meniscus:  Intact. LIGAMENTS Cruciates:  Intact ACL and PCL. Collaterals: Medial collateral ligament is intact. Lateral collateral ligament complex is intact. CARTILAGE Patellofemoral: Diffuse moderate to high-grade partial-thickness cartilage loss. Medial: Large areas of full-thickness cartilage loss over the weight-bearing medial femoral condyle and medial tibial plateau. Lateral: Partial-thickness cartilage loss along the mesial aspect of the lateral compartment. Joint: Small joint effusion. Normal Hoffa's fat. No plical thickening. Popliteal Fossa:  No Baker cyst. Intact popliteus tendon. Extensor Mechanism: Intact quadriceps tendon and patellar tendon. Intact medial and lateral patellar retinaculum. Intact MPFL. Bones: No acute fracture or dislocation. No focal bone lesion. Tricompartmental  osteophytes. Degenerative subchondral marrow edema in the peripheral medial femoral condyle and medial tibial plateau. Other: Mild soft tissue swelling about the knee. No fluid collection. No muscle edema. IMPRESSION: 1. No discrete evidence of soft tissue infection about the knee. 2. Longitudinal tear of the medial meniscus posterior horn. Radial tear of the medial meniscus body. 3. Tricompartmental osteoarthritis, moderate in the medial and patellofemoral compartments. Electronically Signed   By: Titus Dubin M.D.   On: 01/21/2019 15:23    Scheduled Meds: . famotidine  10 mg Oral Daily  . ferrous sulfate  325 mg Oral BID WC  . fesoterodine  4 mg Oral QHS  . folic acid  1 mg Oral Daily  . heparin injection (subcutaneous)  5,000 Units Subcutaneous Q8H  . levothyroxine  75 mcg Oral QAC breakfast  . mirabegron ER  50 mg Oral Daily  . polyethylene glycol  17 g Oral Daily  . potassium chloride  40 mEq Oral Once  . senna  1 tablet Oral Daily  . vitamin B-12  1,000 mcg Oral Daily   Continuous Infusions: . lactated ringers 100 mL/hr at 01/22/19 1150     LOS: 5 days   Vernell Leep, MD, [place], [facility]  To contact the attending provider between 7A-7P or the covering provider during after hours 7P-7A, please log into the web site [email or web address] and access using universal [place] password for that web site. If you do not have the password, please call the hospital operator.

## 2019-01-23 DIAGNOSIS — E1122 Type 2 diabetes mellitus with diabetic chronic kidney disease: Secondary | ICD-10-CM

## 2019-01-23 LAB — BASIC METABOLIC PANEL
Anion gap: 11 (ref 5–15)
BUN: 41 mg/dL — ABNORMAL HIGH (ref 8–23)
CO2: 21 mmol/L — ABNORMAL LOW (ref 22–32)
Calcium: 9.1 mg/dL (ref 8.9–10.3)
Chloride: 100 mmol/L (ref 98–111)
Creatinine, Ser: 2.85 mg/dL — ABNORMAL HIGH (ref 0.44–1.00)
GFR calc non Af Amer: 16 mL/min — ABNORMAL LOW (ref >60)
GFR, EST AFRICAN AMERICAN: 18 mL/min — AB (ref >60)
Glucose, Bld: 107 mg/dL — ABNORMAL HIGH (ref 70–99)
Potassium: 3.8 mmol/L (ref 3.5–5.1)
Sodium: 132 mmol/L — ABNORMAL LOW (ref 135–145)

## 2019-01-23 LAB — CBC
HCT: 24.8 % — ABNORMAL LOW (ref 36.0–46.0)
Hemoglobin: 7.7 g/dL — ABNORMAL LOW (ref 12.0–15.0)
MCH: 28.7 pg (ref 26.0–34.0)
MCHC: 31 g/dL (ref 30.0–36.0)
MCV: 92.5 fL (ref 80.0–100.0)
PLATELETS: 277 10*3/uL (ref 150–400)
RBC: 2.68 MIL/uL — ABNORMAL LOW (ref 3.87–5.11)
RDW: 13.5 % (ref 11.5–15.5)
WBC: 9.5 10*3/uL (ref 4.0–10.5)
nRBC: 0 % (ref 0.0–0.2)

## 2019-01-23 LAB — GLUCOSE, CAPILLARY
GLUCOSE-CAPILLARY: 134 mg/dL — AB (ref 70–99)
Glucose-Capillary: 108 mg/dL — ABNORMAL HIGH (ref 70–99)
Glucose-Capillary: 129 mg/dL — ABNORMAL HIGH (ref 70–99)
Glucose-Capillary: 145 mg/dL — ABNORMAL HIGH (ref 70–99)

## 2019-01-23 LAB — BODY FLUID CULTURE: Culture: NO GROWTH

## 2019-01-23 LAB — OCCULT BLOOD X 1 CARD TO LAB, STOOL: FECAL OCCULT BLD: POSITIVE — AB

## 2019-01-23 MED ORDER — LORATADINE 10 MG PO TABS
10.0000 mg | ORAL_TABLET | Freq: Every day | ORAL | Status: DC
  Administered 2019-01-24 – 2019-01-29 (×4): 10 mg via ORAL
  Filled 2019-01-23 (×5): qty 1

## 2019-01-23 NOTE — Progress Notes (Signed)
CSW called and spoke with the patient's daughter in law. CSW explained that the physical therapist has been working with the patient and has recommended SNF. The family is familiar with the process. The patient was at [facility] last summer and the family does not want to send her back. CSW stated that she would leave a list of skilled nursing facilities in the area. The family would like time to decide and pick some facilities. The family did not give the CSW permission to fax out.   CSW has left a bed list in the patients room and has put one on the patients shadow chart. Patient is from home and is independent prior to this hospitalization.   CSW will continue to follow.   Domenic Schwab, MSW, [place]

## 2019-01-23 NOTE — Progress Notes (Signed)
INFECTIOUS DISEASE PROGRESS NOTE  ID: Erika Fry is a 73 y.o. female with  Principal Problem:   Fever Active Problems:   Aortic stenosis   CKD (chronic kidney disease), stage IV (HCC)   Anemia of chronic disease   Benign essential HTN   Acute lower UTI   Pressure injury of skin  Subjective: Previous constipation, multiple ("dark, tarry...") bowel movements last 24h.  Wants more sleep  Abtx:  Anti-infectives (From admission, onward)   Start     Dose/Rate Route Frequency Ordered Stop   01/17/19 2200  cefTRIAXone (ROCEPHIN) 1 g in sodium chloride 0.9 % 100 mL IVPB  Status:  Discontinued     1 g 200 mL/hr over 30 Minutes Intravenous Every 24 hours 01/17/19 0054 01/19/19 0914   01/16/19 2345  cefTRIAXone (ROCEPHIN) 1 g in sodium chloride 0.9 % 100 mL IVPB     1 g 200 mL/hr over 30 Minutes Intravenous  Once 01/16/19 2330 01/17/19 0055      Medications:  Scheduled: . famotidine  10 mg Oral Daily  . ferrous sulfate  325 mg Oral BID WC  . fesoterodine  4 mg Oral QHS  . folic acid  1 mg Oral Daily  . heparin injection (subcutaneous)  5,000 Units Subcutaneous Q8H  . levothyroxine  75 mcg Oral QAC breakfast  . mirabegron ER  50 mg Oral Daily  . polyethylene glycol  17 g Oral Daily  . potassium chloride  40 mEq Oral Once  . senna  1 tablet Oral Daily  . vitamin B-12  1,000 mcg Oral Daily    Objective: Vital signs in last 24 hours: Temp:  [97.5 F (36.4 C)-98.5 F (36.9 C)] 97.7 F (36.5 C) (02/22 0739) Pulse Rate:  [75-91] 75 (02/22 0739) Resp:  [20-23] 22 (02/22 0533) BP: (92-128)/(50-77) 120/65 (02/22 0739) SpO2:  [95 %-100 %] 100 % (02/22 0739) Weight:  [83.9 kg] 83.9 kg (02/21 1944)   General appearance: alert, cooperative and no distress Resp: clear to auscultation bilaterally Chest wall: no tenderness, L chest pacer nontender, non-fluctuant Cardio: regular rate and rhythm GI: normal findings: bowel sounds normal and soft, non-tender Extremities:  edema none  Lab Results Recent Labs    01/22/19 0634 01/23/19 0836  NA 135 132*  K 4.0 3.8  CL 103 100  CO2 18* 21*  BUN 43* 41*  CREATININE 2.96* 2.85*   Liver Panel No results for input(s): PROT, ALBUMIN, AST, ALT, ALKPHOS, BILITOT, BILIDIR, IBILI in the last 72 hours. Sedimentation Rate No results for input(s): ESRSEDRATE in the last 72 hours. C-Reactive Protein No results for input(s): CRP in the last 72 hours.  Microbiology: Recent Results (from the past 240 hour(s))  Urine culture     Status: None   Collection Time: 01/16/19  9:51 PM  Result Value Ref Range Status   Specimen Description   Final    URINE, RANDOM Performed at [facility] [address] [place], [place] [address]    Special Requests   Final    NONE Performed at [facility], [place] [address] [place], [place] [address]    Culture   Final    NO GROWTH Performed at [facility], [place] [address], [place], [place] [address]    Report Status 01/18/2019 FINAL  Final  Culture, blood (routine x 2)     Status: None   Collection Time: 01/16/19 10:41 PM  Result Value Ref Range Status   Specimen Description  BLOOD LEFT ANTECUBITAL  Final   Special Requests   Final    BOTTLES DRAWN AEROBIC AND ANAEROBIC Blood Culture adequate volume Performed at [facility] [address], [place], [place] [address]    Culture NO GROWTH 5 DAYS  Final   Report Status 01/22/2019 FINAL  Final  Respiratory Panel by PCR     Status: None   Collection Time: 01/17/19 12:23 AM  Result Value Ref Range Status   Adenovirus NOT DETECTED NOT DETECTED Final   Coronavirus 229E NOT DETECTED NOT DETECTED Final    Comment: (NOTE) The Coronavirus on the Respiratory Panel, DOES NOT test for the novel  Coronavirus (2019 nCoV)    Coronavirus HKU1 NOT DETECTED NOT DETECTED Final   Coronavirus NL63 NOT DETECTED NOT DETECTED Final   Coronavirus OC43 NOT DETECTED NOT DETECTED  Final   Metapneumovirus NOT DETECTED NOT DETECTED Final   Rhinovirus / Enterovirus NOT DETECTED NOT DETECTED Final   Influenza A NOT DETECTED NOT DETECTED Final   Influenza B NOT DETECTED NOT DETECTED Final   Parainfluenza Virus 1 NOT DETECTED NOT DETECTED Final   Parainfluenza Virus 2 NOT DETECTED NOT DETECTED Final   Parainfluenza Virus 3 NOT DETECTED NOT DETECTED Final   Parainfluenza Virus 4 NOT DETECTED NOT DETECTED Final   Respiratory Syncytial Virus NOT DETECTED NOT DETECTED Final   Bordetella pertussis NOT DETECTED NOT DETECTED Final   Chlamydophila pneumoniae NOT DETECTED NOT DETECTED Final   Mycoplasma pneumoniae NOT DETECTED NOT DETECTED Final    Comment: Performed at [facility], [place] [address], [place], [place] [address]  Culture, blood (Routine X 2) w Reflex to ID Panel     Status: None (Preliminary result)   Collection Time: 01/19/19 10:50 PM  Result Value Ref Range Status   Specimen Description BLOOD RIGHT HAND  Final   Special Requests   Final    BOTTLES DRAWN AEROBIC ONLY Blood Culture adequate volume Performed at [facility] [address] [place], [place] [address]    Culture NO GROWTH 2 DAYS  Final   Report Status PENDING  Incomplete  Culture, blood (Routine X 2) w Reflex to ID Panel     Status: None (Preliminary result)   Collection Time: 01/19/19 10:50 PM  Result Value Ref Range Status   Specimen Description BLOOD RIGHT ANTECUBITAL  Final   Special Requests   Final    BOTTLES DRAWN AEROBIC AND ANAEROBIC Blood Culture adequate volume Performed at [facility], [place] [address], [place], [place] [address]    Culture NO GROWTH 2 DAYS  Final   Report Status PENDING  Incomplete  Body fluid culture     Status: None   Collection Time: 01/20/19 10:35 AM  Result Value Ref Range Status   Specimen Description SYNOVIAL LEFT KNEE  Final   Special Requests NONE  Final   Gram Stain   Final    RARE WBC PRESENT, PREDOMINANTLY PMN NO ORGANISMS  SEEN Performed at [facility], [place] [address] [place], [place] [address]    Culture NO GROWTH  Final   Report Status 01/23/2019 FINAL  Final    Studies/Results: Mr Lumbar Spine Wo Contrast  Result Date: 01/22/2019 CLINICAL DATA:  History of discitis/osteomyelitis at L4-5. EXAM: MRI LUMBAR SPINE WITHOUT CONTRAST TECHNIQUE: Multiplanar, multisequence MR imaging of the lumbar spine was performed. No intravenous contrast was administered. COMPARISON:  MRI lumbar spine since 05/13/2018 and 08/31/2018 FINDINGS: Examination is somewhat degraded by  motion artifact, particularly the axial images. Segmentation: There are five lumbar type vertebral bodies. The last full intervertebral disc space is labeled L5-S1. This correlates with the prior MRIs. Alignment:  Normal Vertebrae: Resolution of the diskitis and osteomyelitis at L4-5. No residual marrow edema or paraspinal process. No findings for epidural abscess. Chronic endplate reactive changes at L4-5. Conus medullaris and cauda equina: Conus extends to the L1 level. Conus and cauda equina appear normal. Paraspinal and other soft tissues: No signal paraspinal findings. No psoas muscle abscess or myositis. Persistent extrarenal pelvis on the right side. No adenopathy or aortic aneurysm. Disc levels: T12-L1: Stable small left paracentral disc extrusion with minimal impression on the left side of thecal sac. L1-2: Diffuse annular bulge and osteophytic ridging with mild bilateral lateral recess encroachment. There is also a persistent broad-based right foraminal, extraforaminal and far lateral disc protrusion. Possibly irritating the right L1 nerve root. L2-3: Diffuse bulging annulus with mild bilateral lateral recess stenosis, left greater than right. There is also mild left foraminal stenosis. L3-4: Diffuse bulging annulus and mild facet disease contributing to moderate bilateral lateral recess stenosis. Small focal extraforaminal disc protrusion on the right  comes close to contacting the right L3 nerve root. This appears stable. L4-5: Collapsed disc space related to prior diskitis and osteomyelitis. No findings for residual infection or inflammation. Degenerated bulging annulus but no spinal or significant lateral recess stenosis. There is mild persistent foraminal and extraforaminal encroachment on the right L4 nerve root. Moderate facet disease. L5-S1: No significant findings. IMPRESSION: 1. Resolution of diskitis and osteomyelitis at L4-5. 2. Stable multilevel disc protrusions with lateral recess and foraminal stenosis as detailed above. No new or progressive findings. Electronically Signed   By: Marijo Sanes M.D.   On: 01/22/2019 11:32   Mr Knee Left Wo Contrast  Result Date: 01/21/2019 CLINICAL DATA:  Fever of unknown origin. Severe knee pain. Negative left knee aspiration yesterday. EXAM: MRI OF THE LEFT KNEE WITHOUT CONTRAST TECHNIQUE: Multiplanar, multisequence MR imaging of the knee was performed. No intravenous contrast was administered. COMPARISON:  Left knee x-rays dated January 20, 2019. FINDINGS: MENISCI Medial meniscus: Longitudinal tear of the posterior horn. Radial tear of the body. Lateral meniscus:  Intact. LIGAMENTS Cruciates:  Intact ACL and PCL. Collaterals: Medial collateral ligament is intact. Lateral collateral ligament complex is intact. CARTILAGE Patellofemoral: Diffuse moderate to high-grade partial-thickness cartilage loss. Medial: Large areas of full-thickness cartilage loss over the weight-bearing medial femoral condyle and medial tibial plateau. Lateral: Partial-thickness cartilage loss along the mesial aspect of the lateral compartment. Joint: Small joint effusion. Normal Hoffa's fat. No plical thickening. Popliteal Fossa:  No Baker cyst. Intact popliteus tendon. Extensor Mechanism: Intact quadriceps tendon and patellar tendon. Intact medial and lateral patellar retinaculum. Intact MPFL. Bones: No acute fracture or dislocation. No  focal bone lesion. Tricompartmental osteophytes. Degenerative subchondral marrow edema in the peripheral medial femoral condyle and medial tibial plateau. Other: Mild soft tissue swelling about the knee. No fluid collection. No muscle edema. IMPRESSION: 1. No discrete evidence of soft tissue infection about the knee. 2. Longitudinal tear of the medial meniscus posterior horn. Radial tear of the medial meniscus body. 3. Tricompartmental osteoarthritis, moderate in the medial and patellofemoral compartments. Electronically Signed   By: Titus Dubin M.D.   On: 01/21/2019 15:23     Assessment/Plan: FUO Diskitis L4/5 resolved from prior L knee effusion Pacer CKD4 Ureteral Stent 2-13 DM2  BCx 2-18 (NGTD), Synovial Fluid Cx 2-19 (-), UCx 2-15 (-)  Total  days of antibiotics: 2-15 --> 2-18 Ceftriaxone         She has been afebrile since 2-20. Will continue to watch.  She has multiple possible etiologies for her fever, but her negative Cx make many of these less clear.  Will defer to primary if she needs w/u of her dark stools (pt states she is on iron).   Bobby Rumpf MD, FACP Infectious Diseases (pager) [phone] [email or web address] 01/23/2019, 1:35 PM  LOS: 6 days

## 2019-01-23 NOTE — Progress Notes (Signed)
PROGRESS NOTE  Erika Fry  MRN:[id number] DOB: 09/19/1946 DOA: 01/16/2019 PCP: Avva, Ravisankar, MD   Brief Narrative: 73 y.o.femalewith medical history significantfor stage IV CKD, hypertension, diabetes, L4-L5 discitis, with recent discharge from urology service who presented for poor per oral intake, pelvic pain, lethargy. Work up demonstrated BUN of 99, and creatinine of 6, with pyuria on UA and dysuria. Ceftriaxone was started and nephrology was consulted.  Acute kidney injury improving.  Ongoing chronic pelvic pain.  ID consulted for FUO.  Extensive work-up thus far nonrevealing and she continues to have fevers intermittently.  Assessment & Plan: Principal Problem:   Fever Active Problems:   Aortic stenosis   CKD (chronic kidney disease), stage IV (HCC)   Anemia of chronic disease   Benign essential HTN   Acute lower UTI   Pressure injury of skin  Fever:  -On admission due to UTI symptoms and positive UA, treated with ceftriaxone, urine cultures however were negative and completed 3 days course of ceftriaxone 2/15- 2/17.  -Due to ongoing fevers, ID was consulted and patient underwent extensive evaluation. -CRP 20, ESR 98, CT abdomen and pelvis without contrast 2/18: L4-5 discitis/osteomyelitis similar to prior lumbar MRI, probable gallstones without cholecystitis, right ureteral stent with mild right hydronephrosis and hydroureter. -Orthopedics performed left knee diagnostic arthrocentesis: Synovial fluid showed no crystals, WBCs 255, 61% neutrophils, rest lymphocytes and monocytes.  Synovial fluid culture negative, final. - MRI left knee 2/20: No discrete evidence of soft tissue infection about the knee, medial meniscus tear and tricompartmental osteoarthritis.  Left knee pain has improved after arthrocentesis and patient was able to ambulate in the room with the help of walker and PT supervision on 2/21. - Repeated blood cultures 2/18 x 2: Negative to date. - Lumbar spine  MRI shows resolution of L4-5 discitis and osteomyelitis. - Had fever of 102.9 last night and none since.  Monitoring off of antibiotics.  ID continues to see.  Left knee pain Unclear etiology.  Management as noted above.  Pain improved and able to weight-bear and ambulate some with PT.  AKI on stage IV CKD:  As per nephrology signed off 2/19, patient's recent baseline creatinine was progressing and was 5.3 prior to urological procedures.  She follows with Dr. Kruska at [facility]. -Acute kidney injury was felt due to dehydration and obstructive etiology/hydronephrosis. -Post IV fluid hydration and right ureteral stent placement and lithotripsy on 01/14/2019, creatinine has steadily improved to 2.58.  Continue to follow BMP. -Outpatient follow-up with Dr. Kruska, nephrology will arrange. -Creatinine has crept up from 2.58-2.96.  Remains on IV fluids.  Etiology not clear.  Foley removed 2/20.  Check bladder scan to make sure she does not have urinary retention.  Creatinine slightly better/2.85.  Follow BMP in a.m.  NAGMA: Changed to LR -Bicarbonate hovering in the 18-20 range for the last few days.  Remains on IV LR.  Bicarbonate has improved to 21.  Anion gap 11.  Acute metabolic encephalopathy: Possibly due to uremia vs. FUO.  -Resolved.  Anemia of chronic disease:  -Hemoglobin stable in the 7.5-8 range for the last several days.  Transfuse if hemoglobin 7 or less.  No bleeding reported.  Continue ferrous sulfate, B12 and folate supplements. -Due to history of dark stools (suspect from iron supplements and less likely due to GI bleed), checking CBC now.  Benign essential HTN:  - Continue home medications.  Mildly controlled at times.  Stage I pressure injury bilateral buttocks. Not known POA status.  -   Offload as able.  Obesity/Body mass index is 30.78 kg/m. - Weight loss long term recommended.  Chronic pelvic pain Status post cystoscopy, hydrodistention, right ureteral stent placement and  lithotripsy by Dr. Louis Meckel, urology on 01/14/2018.  Had ongoing severe PV pain on 2/20 and patient stated "I want the stent out".  I discussed with Dr. Louis Meckel on 2/20, DC Foley to see if that will help her pain.  Urine greenish discoloration is from [place].  Continue Myrbetriq, Toviaz and [employer]. Following removal of Foley catheter on 2/20 and multiple BMs on 2/21, vaginal pain significantly improved, now complains of occasional dysuria, rectal pain/foreign body sensation also seems to have resolved.  Monitor.  L4-5 discitis/osteomyelitis Has completed course of IV followed by oral antibiotics.  Management per ID.  Lumbar spine MRI shows resolution.  DM2 A1c 6.3 on 04/30/2018.  Does not appear that she was on medications PTA/possibly diet controlled.  CBGs show good inpatient control.  GERD Continue Pepcid.  Hypothyroid Continue Synthroid.  S/p PPM.  Hypokalemia: Replaced.  "Dark stools" Following bowel regimen yesterday, patient had multiple BMs and reports "dark stools".  Witnessed small dried dark green dry stools PV this morning.  Suspect due to iron supplements.  Will send stool for FOBT.  If hemoglobin stable and even if FOBT positive, likely will monitor without aggressive intervention.  DVT prophylaxis: Subcutaneous heparin. Code Status: Full Family Communication: None at bedside. Disposition Plan: DC to SNF pending clinical improvement and no further fevers.  Hopefully next week.  Consultants:   Nephrology  Urology  Orthopedics  Procedures:   Foley catheter-discontinued 2/20  Antimicrobials:  Ceftriaxone-discontinued  Subjective: States that she had multiple soft BMs after bowel regimen yesterday.  Stools "dark".  No further rectal pain or foreign body sensation.  Complains of some dysuria at times.  States that she ambulated in the room with the help of a walker and PT supervision yesterday for the first time after a long time.  Objective: Vitals:   01/22/19  1946 01/22/19 2225 01/23/19 0533 01/23/19 0739  BP: (!) 92/50 128/77 (!) 118/58 120/65  Pulse: 78 83 78 75  Resp: (!) 23  (!) 22   Temp: 98.5 F (36.9 C)  (!) 97.5 F (36.4 C) 97.7 F (36.5 C)  TempSrc: Oral  Oral Oral  SpO2: 99%  95% 100%  Weight: 83.9 kg     Height:        Intake/Output Summary (Last 24 hours) at 01/23/2019 1404 Last data filed at 01/23/2019 1334 Gross per 24 hour  Intake 3033.76 ml  Output 1500 ml  Net 1533.76 ml   Filed Weights   01/19/19 2028 01/20/19 2133 01/22/19 1944  Weight: 83.8 kg 83.8 kg 83.9 kg   Gen: Pleasant elderly female, moderately built and obese lying uncomfortably propped up in bed.  Oral mucosa moist. Pulm: Clear to auscultation.  No increased work of breathing.  Stable. CV: S1 and S2 heard, RRR.  No JVD, murmurs or pedal edema.  Stable. GI: Abdomen is nondistended, soft, minimal suprapubic tenderness.  No organomegaly or masses appreciated.  Normal bowel sounds heard. PV: Examined along with female NT as chaperone in the room.  Mild dried dark green stools inferior labial folds.  No other acute findings to explain dysuria. Ext: No lower extremity edema.  Left knee without swelling, redness, warmth or tenderness.  However has mild painful range of movements. Skin: No new rashes, lesions or ulcers on visualized skin. Neuro: Alert and oriented.  No focal neurological  deficits.  Stable. Psych: Judgement and insight appear fair. Mood euthymic & affect congruent. Behavior is appropriate.    Data Reviewed: I have personally reviewed following labs and imaging studies  CBC: Recent Labs  Lab 01/16/19 2241 01/18/19 0416 01/19/19 0456 01/20/19 0356  WBC 10.6* 7.5 6.5 8.3  NEUTROABS 8.2*  --   --   --   HGB 8.2* 7.5* 7.6* 8.0*  HCT 26.7* 23.5* 23.0* 24.3*  MCV 98.5 93.6 91.3 90.7  PLT 173 164 174 189   Basic Metabolic Panel: Recent Labs  Lab 01/19/19 0456 01/20/19 0356 01/21/19 0759 01/22/19 0634 01/23/19 0836  NA 134* 133* 133* 135  132*  K 3.2* 3.1* 3.2* 4.0 3.8  CL 102 101 103 103 100  CO2 18* 19* 20* 18* 21*  GLUCOSE 152* 118* 115* 145* 107*  BUN 58* 47* 39* 43* 41*  CREATININE 3.35* 2.87* 2.58* 2.96* 2.85*  CALCIUM 9.1 9.1 8.8* 9.1 9.1  PHOS 3.7  --   --   --   --    GFR: Estimated Creatinine Clearance: 18.5 mL/min (A) (by C-G formula based on SCr of 2.85 mg/dL (H)). Liver Function Tests: Recent Labs  Lab 01/16/19 2241 01/19/19 0456  AST 14*  --   ALT 5  --   ALKPHOS 89  --   BILITOT 0.7  --   PROT 6.7  --   ALBUMIN 3.2* 2.2*   Recent Labs  Lab 01/16/19 2241  LIPASE 26   CBG: Recent Labs  Lab 01/22/19 1134 01/22/19 1650 01/22/19 1950 01/23/19 0736 01/23/19 1102  GLUCAP 117* 128* 150* 108* 134*   Urine analysis:    Component Value Date/Time   COLORURINE YELLOW 01/20/2019 0649   APPEARANCEUR TURBID (A) 01/20/2019 0649   LABSPEC 1.008 01/20/2019 0649   PHURINE 5.0 01/20/2019 0649   GLUCOSEU NEGATIVE 01/20/2019 0649   HGBUR MODERATE (A) 01/20/2019 0649   BILIRUBINUR NEGATIVE 01/20/2019 0649   KETONESUR 5 (A) 01/20/2019 0649   PROTEINUR 100 (A) 01/20/2019 0649   NITRITE NEGATIVE 01/20/2019 0649   LEUKOCYTESUR LARGE (A) 01/20/2019 0649   Recent Results (from the past 240 hour(s))  Urine culture     Status: None   Collection Time: 01/16/19  9:51 PM  Result Value Ref Range Status   Specimen Description   Final    URINE, RANDOM Performed at [facility], [address] [place], [place] [address]    Special Requests   Final    NONE Performed at [facility], [address] [place], [place] [address]    Culture   Final    NO GROWTH Performed at [facility], [address] [place], [place] [address]    Report Status 01/18/2019 FINAL  Final  Culture, blood (routine x 2)     Status: None   Collection Time: 01/16/19 10:41 PM  Result Value Ref Range Status   Specimen Description BLOOD LEFT ANTECUBITAL  Final   Special Requests   Final      BOTTLES DRAWN AEROBIC AND ANAEROBIC Blood Culture adequate volume Performed at [facility], [address] , [place] [address]    Culture NO GROWTH 5 DAYS  Final   Report Status 01/22/2019 FINAL  Final  Respiratory Panel by PCR     Status: None   Collection Time: 01/17/19 12:23 AM  Result Value Ref Range Status   Adenovirus NOT DETECTED NOT DETECTED Final   Coronavirus 229E NOT DETECTED NOT DETECTED   Final    Comment: (NOTE) The Coronavirus on the Respiratory Panel, DOES NOT test for the novel  Coronavirus (2019 nCoV)    Coronavirus HKU1 NOT DETECTED NOT DETECTED Final   Coronavirus NL63 NOT DETECTED NOT DETECTED Final   Coronavirus OC43 NOT DETECTED NOT DETECTED Final   Metapneumovirus NOT DETECTED NOT DETECTED Final   Rhinovirus / Enterovirus NOT DETECTED NOT DETECTED Final   Influenza A NOT DETECTED NOT DETECTED Final   Influenza B NOT DETECTED NOT DETECTED Final   Parainfluenza Virus 1 NOT DETECTED NOT DETECTED Final   Parainfluenza Virus 2 NOT DETECTED NOT DETECTED Final   Parainfluenza Virus 3 NOT DETECTED NOT DETECTED Final   Parainfluenza Virus 4 NOT DETECTED NOT DETECTED Final   Respiratory Syncytial Virus NOT DETECTED NOT DETECTED Final   Bordetella pertussis NOT DETECTED NOT DETECTED Final   Chlamydophila pneumoniae NOT DETECTED NOT DETECTED Final   Mycoplasma pneumoniae NOT DETECTED NOT DETECTED Final    Comment: Performed at [facility], [place] [address], [place], [place] [address]  Culture, blood (Routine X 2) w Reflex to ID Panel     Status: None (Preliminary result)   Collection Time: 01/19/19 10:50 PM  Result Value Ref Range Status   Specimen Description BLOOD RIGHT HAND  Final   Special Requests   Final    BOTTLES DRAWN AEROBIC ONLY Blood Culture adequate volume Performed at [facility] [address], [place], [place] [address]    Culture NO GROWTH 2 DAYS  Final   Report Status PENDING  Incomplete  Culture, blood  (Routine X 2) w Reflex to ID Panel     Status: None (Preliminary result)   Collection Time: 01/19/19 10:50 PM  Result Value Ref Range Status   Specimen Description BLOOD RIGHT ANTECUBITAL  Final   Special Requests   Final    BOTTLES DRAWN AEROBIC AND ANAEROBIC Blood Culture adequate volume Performed at [facility], [place] [address], [place], [place] [address]    Culture NO GROWTH 2 DAYS  Final   Report Status PENDING  Incomplete  Body fluid culture     Status: None   Collection Time: 01/20/19 10:35 AM  Result Value Ref Range Status   Specimen Description SYNOVIAL LEFT KNEE  Final   Special Requests NONE  Final   Gram Stain   Final    RARE WBC PRESENT, PREDOMINANTLY PMN NO ORGANISMS SEEN Performed at [facility], [place] [address], [place], [place] [address]    Culture NO GROWTH  Final   Report Status 01/23/2019 FINAL  Final      Radiology Studies: Mr Lumbar Spine Wo Contrast  Result Date: 01/22/2019 CLINICAL DATA:  History of discitis/osteomyelitis at L4-5. EXAM: MRI LUMBAR SPINE WITHOUT CONTRAST TECHNIQUE: Multiplanar, multisequence MR imaging of the lumbar spine was performed. No intravenous contrast was administered. COMPARISON:  MRI lumbar spine since 05/13/2018 and 08/31/2018 FINDINGS: Examination is somewhat degraded by motion artifact, particularly the axial images. Segmentation: There are five lumbar type vertebral bodies. The last full intervertebral disc space is labeled L5-S1. This correlates with the prior MRIs. Alignment:  Normal Vertebrae: Resolution of the diskitis and osteomyelitis at L4-5. No residual marrow edema or paraspinal process. No findings for epidural abscess. Chronic endplate reactive changes at L4-5. Conus medullaris and cauda equina: Conus extends to the L1 level. Conus and cauda equina appear normal. Paraspinal and other soft tissues: No signal paraspinal findings. No psoas muscle abscess or myositis. Persistent extrarenal  pelvis on the right side.  No adenopathy or aortic aneurysm. Disc levels: T12-L1: Stable small left paracentral disc extrusion with minimal impression on the left side of thecal sac. L1-2: Diffuse annular bulge and osteophytic ridging with mild bilateral lateral recess encroachment. There is also a persistent broad-based right foraminal, extraforaminal and far lateral disc protrusion. Possibly irritating the right L1 nerve root. L2-3: Diffuse bulging annulus with mild bilateral lateral recess stenosis, left greater than right. There is also mild left foraminal stenosis. L3-4: Diffuse bulging annulus and mild facet disease contributing to moderate bilateral lateral recess stenosis. Small focal extraforaminal disc protrusion on the right comes close to contacting the right L3 nerve root. This appears stable. L4-5: Collapsed disc space related to prior diskitis and osteomyelitis. No findings for residual infection or inflammation. Degenerated bulging annulus but no spinal or significant lateral recess stenosis. There is mild persistent foraminal and extraforaminal encroachment on the right L4 nerve root. Moderate facet disease. L5-S1: No significant findings. IMPRESSION: 1. Resolution of diskitis and osteomyelitis at L4-5. 2. Stable multilevel disc protrusions with lateral recess and foraminal stenosis as detailed above. No new or progressive findings. Electronically Signed   By: P.  Gallerani M.D.   On: 01/22/2019 11:32   Mr Knee Left Wo Contrast  Result Date: 01/21/2019 CLINICAL DATA:  Fever of unknown origin. Severe knee pain. Negative left knee aspiration yesterday. EXAM: MRI OF THE LEFT KNEE WITHOUT CONTRAST TECHNIQUE: Multiplanar, multisequence MR imaging of the knee was performed. No intravenous contrast was administered. COMPARISON:  Left knee x-rays dated January 20, 2019. FINDINGS: MENISCI Medial meniscus: Longitudinal tear of the posterior horn. Radial tear of the body. Lateral meniscus:  Intact. LIGAMENTS Cruciates:  Intact ACL  and PCL. Collaterals: Medial collateral ligament is intact. Lateral collateral ligament complex is intact. CARTILAGE Patellofemoral: Diffuse moderate to high-grade partial-thickness cartilage loss. Medial: Large areas of full-thickness cartilage loss over the weight-bearing medial femoral condyle and medial tibial plateau. Lateral: Partial-thickness cartilage loss along the mesial aspect of the lateral compartment. Joint: Small joint effusion. Normal Hoffa's fat. No plical thickening. Popliteal Fossa:  No Baker cyst. Intact popliteus tendon. Extensor Mechanism: Intact quadriceps tendon and patellar tendon. Intact medial and lateral patellar retinaculum. Intact MPFL. Bones: No acute fracture or dislocation. No focal bone lesion. Tricompartmental osteophytes. Degenerative subchondral marrow edema in the peripheral medial femoral condyle and medial tibial plateau. Other: Mild soft tissue swelling about the knee. No fluid collection. No muscle edema. IMPRESSION: 1. No discrete evidence of soft tissue infection about the knee. 2. Longitudinal tear of the medial meniscus posterior horn. Radial tear of the medial meniscus body. 3. Tricompartmental osteoarthritis, moderate in the medial and patellofemoral compartments. Electronically Signed   By: William T Derry M.D.   On: 01/21/2019 15:23    Scheduled Meds: . famotidine  10 mg Oral Daily  . ferrous sulfate  325 mg Oral BID WC  . fesoterodine  4 mg Oral QHS  . folic acid  1 mg Oral Daily  . heparin injection (subcutaneous)  5,000 Units Subcutaneous Q8H  . levothyroxine  75 mcg Oral QAC breakfast  . mirabegron ER  50 mg Oral Daily  . polyethylene glycol  17 g Oral Daily  . potassium chloride  40 mEq Oral Once  . senna  1 tablet Oral Daily  . vitamin B-12  1,000 mcg Oral Daily   Continuous Infusions: . lactated ringers 100 mL/hr at 01/23/19 1119     LOS: 6 days   Anand Hongalgi, MD, FACP,   FHM. [facility]  To contact the attending provider  between 7A-7P or the covering provider during after hours 7P-7A, please log into the web site [email or web address] and access using universal [place] password for that web site. If you do not have the password, please call the hospital operator.

## 2019-01-24 DIAGNOSIS — R300 Dysuria: Secondary | ICD-10-CM

## 2019-01-24 LAB — BASIC METABOLIC PANEL
Anion gap: 12 (ref 5–15)
BUN: 36 mg/dL — ABNORMAL HIGH (ref 8–23)
CO2: 18 mmol/L — AB (ref 22–32)
Calcium: 9 mg/dL (ref 8.9–10.3)
Chloride: 103 mmol/L (ref 98–111)
Creatinine, Ser: 2.8 mg/dL — ABNORMAL HIGH (ref 0.44–1.00)
GFR calc Af Amer: 19 mL/min — ABNORMAL LOW (ref >60)
GFR calc non Af Amer: 16 mL/min — ABNORMAL LOW (ref >60)
Glucose, Bld: 122 mg/dL — ABNORMAL HIGH (ref 70–99)
Potassium: 3.4 mmol/L — ABNORMAL LOW (ref 3.5–5.1)
Sodium: 133 mmol/L — ABNORMAL LOW (ref 135–145)

## 2019-01-24 LAB — CBC
HCT: 24.3 % — ABNORMAL LOW (ref 36.0–46.0)
Hemoglobin: 7.7 g/dL — ABNORMAL LOW (ref 12.0–15.0)
MCH: 29.7 pg (ref 26.0–34.0)
MCHC: 31.7 g/dL (ref 30.0–36.0)
MCV: 93.8 fL (ref 80.0–100.0)
NRBC: 0 % (ref 0.0–0.2)
Platelets: 278 10*3/uL (ref 150–400)
RBC: 2.59 MIL/uL — AB (ref 3.87–5.11)
RDW: 13.5 % (ref 11.5–15.5)
WBC: 9.1 10*3/uL (ref 4.0–10.5)

## 2019-01-24 LAB — GLUCOSE, CAPILLARY
Glucose-Capillary: 117 mg/dL — ABNORMAL HIGH (ref 70–99)
Glucose-Capillary: 154 mg/dL — ABNORMAL HIGH (ref 70–99)
Glucose-Capillary: 144 mg/dL — ABNORMAL HIGH (ref 70–99)
Glucose-Capillary: 126 mg/dL — ABNORMAL HIGH (ref 70–99)

## 2019-01-24 MED ORDER — POTASSIUM CHLORIDE CRYS ER 20 MEQ PO TBCR
40.0000 meq | EXTENDED_RELEASE_TABLET | Freq: Once | ORAL | Status: AC
  Administered 2019-01-24: 40 meq via ORAL
  Filled 2019-01-24: qty 2

## 2019-01-24 MED ORDER — TAMSULOSIN HCL 0.4 MG PO CAPS
0.4000 mg | ORAL_CAPSULE | Freq: Every day | ORAL | Status: DC
  Administered 2019-01-24 – 2019-01-29 (×6): 0.4 mg via ORAL
  Filled 2019-01-24 (×6): qty 1

## 2019-01-24 NOTE — Progress Notes (Addendum)
PROGRESS NOTE  Erika Fry  FTD:[id number] DOB: 10-31-1946 DOA: 01/16/2019 PCP: [name], MD   Brief Narrative: 73 y.o.femalewith medical history significantfor stage IV CKD, hypertension, diabetes, L4-L5 discitis, with recent discharge from urology service who presented for poor per oral intake, pelvic pain, lethargy. Work up demonstrated BUN of 99, and creatinine of 6, with pyuria on UA and dysuria. Ceftriaxone was started and nephrology was consulted.  Acute kidney injury improving.  Ongoing chronic pelvic pain.  ID consulted for FUO.  Extensive work-up thus far nonrevealing and she continues to have fevers intermittently.  Assessment & Plan: Principal Problem:   Fever Active Problems:   Aortic stenosis   CKD (chronic kidney disease), stage IV (HCC)   Anemia of chronic disease   Benign essential HTN   Acute lower UTI   Pressure injury of skin  Fever:  -On admission due to UTI symptoms and positive UA, treated with ceftriaxone, urine cultures however were negative and completed 3 days course of ceftriaxone 2/15- 2/17.  -Due to ongoing fevers, ID was consulted and patient underwent extensive evaluation. -CRP 20, ESR 98, CT abdomen and pelvis without contrast 2/18: L4-5 discitis/osteomyelitis similar to prior lumbar MRI, probable gallstones without cholecystitis, right ureteral stent with mild right hydronephrosis and hydroureter. -Orthopedics performed left knee diagnostic arthrocentesis: Synovial fluid showed no crystals, WBCs 255, 61% neutrophils, rest lymphocytes and monocytes.  Synovial fluid culture negative, final. - MRI left knee 2/20: No discrete evidence of soft tissue infection about the knee, medial meniscus tear and tricompartmental osteoarthritis.  Left knee pain has improved after arthrocentesis and patient was able to ambulate in the room with the help of walker and PT supervision on 2/21. - Repeated blood cultures 2/18 x 2: Negative to date. - Lumbar spine  MRI shows resolution of L4-5 discitis and osteomyelitis. - Had fever of 102.9 on night of 2/20.  Afebrile now for greater than 48 hours.  Monitoring off of antibiotics.  ID continues to see.  Left knee pain Unclear etiology.  Management as noted above.  Pain improved and able to weight-bear and ambulate some with PT.  AKI on stage IV CKD:  As per nephrology signed off 2/19, patient's recent baseline creatinine was progressing and was 5.3 prior to urological procedures.  She follows with Dr. Johnney Ou at [place]. -Acute kidney injury was felt due to dehydration and obstructive etiology/hydronephrosis. -Post IV fluid hydration and right ureteral stent placement and lithotripsy on 01/14/2019, creatinine has steadily improved to 2.58.  Continue to follow BMP. -Outpatient follow-up with Dr. Johnney Ou, nephrology will arrange. -Creatinine has crept up from 2.58-2.96.  Remains on IV fluids.  Etiology not clear.  Foley removed 2/20.  Check bladder scan to make sure she does not have urinary retention.  Creatinine has plateaued at 2.8 range over the last couple of days which may be her new baseline.  Follow-up BMP periodically and closely.  NAGMA: Changed to LR -Bicarbonate hovering in the 18-20 range for the last few days.  Bicarbonate continues to fluctuate.  Discontinue LR.?  Consider sodium bicarbonate tablets.  Follow BMP.  Acute metabolic encephalopathy: Possibly due to uremia vs. FUO.  -Resolved.  Anemia of chronic disease:  -Hemoglobin stable in the 7.5-8 range for the last several days.  Transfuse if hemoglobin 7 or less.  No bleeding reported.  Continue ferrous sulfate, B12 and folate supplements. -Due to history of dark stools (suspect from iron supplements and less likely due to GI bleed), check hemoglobin and remained stable.  FOBT +.  Follow CBCs.  Benign essential HTN:  - Continue home medications.  Mildly controlled at times.  Stage I pressure injury bilateral buttocks. Not known POA status.    - Offload as able.  Obesity/Body mass index is 32.57 kg/m. - Weight loss long term recommended.  Chronic pelvic pain Status post cystoscopy, hydrodistention, right ureteral stent placement and lithotripsy by Dr. Louis Meckel, urology on 01/14/2018.  Had ongoing severe PV pain on 2/20 and patient stated "I want the stent out".  I discussed with Dr. Louis Meckel on 2/20, DC Foley to see if that will help her pain.  Urine greenish discoloration is from Urelle.  Continue Myrbetriq, Toviaz and [employer]. Following removal of Foley catheter on 2/20 and multiple BMs on 2/21, vaginal pain significantly improved, now complains of occasional dysuria, rectal pain/foreign body sensation also seems to have resolved.   Reportedly has chronic dysuria since before Thanksgiving last year.  Etiology unclear.?  Interstitial cystitis.  Unable to use Pyridium due to renal insufficiency.  Do not know if have many options.  I discussed with urologist on call 2/23 who recommended trial of tamsulosin 0.4 mg daily which may help with urinary stent related dysuria.  L4-5 discitis/osteomyelitis Has completed course of IV followed by oral antibiotics.  Management per ID.  Lumbar spine MRI shows resolution.  DM2 A1c 6.3 on 04/30/2018.  Does not appear that she was on medications PTA/possibly diet controlled.  CBGs show good inpatient control.  GERD Continue Pepcid.  Hypothyroid Continue Synthroid.  S/p PPM.  Hypokalemia: Replaced.  "Dark stools"/FOBT + Following bowel regimen , patient having "dark stools".  Witnessed small dried dark green dry stools PV on 2/22.  Suspect due to iron supplements.  FOBT positive but hemoglobin stable.  Had EGD June 2019 without significant findings.  Reports that she has never had a colonoscopy.  Recommend elective outpatient colonoscopy when able.   DVT prophylaxis: Subcutaneous heparin. Code Status: Full Family Communication: Discussed in detail with patient's son, updated care and answered  questions. Disposition Plan: DC to SNF pending clinical improvement and no further fevers.  Hopefully next day or 2.  Consultants:   Nephrology  Urology  Orthopedics  Procedures:   Foley catheter-discontinued 2/20  Antimicrobials:  Ceftriaxone-discontinued  Pathology: 01/14/2019: Diagnosis 1. Bladder, biopsy, right lateral wall BENIGN UROTHELIUM AND SUBMUCOSA WITH PROMINENT CHRONIC INFLAMMATION, TELANGIECTASIA AND REACTIVE CHANGES 2. Bladder, biopsy, posterior wall MOSTLY DENUDED SUBMUCOSA WITH PROMINENT INFLAMMATION, HEMORRHAGE AND REACTIVE CHANGES 3. Bladder, biopsy, left lateral wall FIBRIN AND INFLAMMATORY CELLS 4. Bladder, biopsy, middle trigone BENIGN UROTHELIUM AND SUBMUCOSA WITH PROMINENT FOLLICULAR CYSTITIS AND REACTIVE CHANGES  Cytology 01/14/2019:  Diagnosis URINE CYTOLOGY, CATHETERIZED, RIGHT UPPER TRACT(SPECIMEN 1 OF 1 COLLECTED 01/14/19): NEGATIVE FOR HIGH GRADE UROTHELIAL CARCINOMA. ACUTE INFLAMMATION.  Subjective: Ongoing dysuria, reportedly present since before Thanksgiving of last year.  Now abdominal/suprapubic pain or rectal pain.  Continues to have BM.  Tolerating diet without nausea or vomiting.  Objective: Vitals:   01/23/19 1644 01/23/19 2145 01/24/19 0506 01/24/19 0731  BP: 135/77 (!) 144/71 133/78 125/68  Pulse: 81 85 77 79  Resp: (!) _0 Temp: 97.9 F (36.6 C) 98.4 F (36.9 C) 98 F (36.7 C) 97.9 F (36.6 C)  TempSrc: Oral Oral Oral Oral  SpO2: 100% 100% 100% 100%  Weight:  88.8 kg    Height:        Intake/Output Summary (Last 24 hours) at 01/24/2019 1245 Last data filed at 01/24/2019 0945 Gross per 24 hour  Intake 2939.85 ml  Output 2100 ml  Net 839.85 ml   Filed Weights   01/20/19 2133 01/22/19 1944 01/23/19 2145  Weight: 83.8 kg 83.9 kg 88.8 kg   Gen: Pleasant elderly female, moderately built and obese lying uncomfortably propped up in bed.  Oral mucosa moist. Pulm: Clear to auscultation.  No increased work of  breathing.  Stable. CV: S1 and S2 heard, RRR.  No JVD, murmurs or pedal edema.  Stable. GI: Abdomen is nondistended, soft, nontender.  No organomegaly or masses appreciated.  Normal bowel sounds heard. PV: As examined on 2/22 along with female NT as chaperone in the room.  Mild dried dark green stools inferior labial folds.  No other acute findings to explain dysuria. Ext: No lower extremity edema.  Left knee without swelling, redness, warmth or tenderness.  However has mild painful range of movements. Skin: No new rashes, lesions or ulcers on visualized skin. Neuro: Alert and oriented.  No focal neurological deficits.  Stable. Psych: Judgement and insight appear fair. Mood euthymic & affect congruent. Behavior is appropriate.    Data Reviewed: I have personally reviewed following labs and imaging studies  CBC: Recent Labs  Lab 01/18/19 0416 01/19/19 0456 01/20/19 0356 01/23/19 1410 01/24/19 1225  WBC 7.5 6.5 8.3 9.5 9.1  HGB 7.5* 7.6* 8.0* 7.7* 7.7*  HCT 23.5* 23.0* 24.3* 24.8* 24.3*  MCV 93.6 91.3 90.7 92.5 93.8  PLT 164 174 189 277 497   Basic Metabolic Panel: Recent Labs  Lab 01/19/19 0456 01/20/19 0356 01/21/19 0759 01/22/19 0634 01/23/19 0836 01/24/19 0332  NA 134* 133* 133* 135 132* 133*  K 3.2* 3.1* 3.2* 4.0 3.8 3.4*  CL 102 101 103 103 100 103  CO2 18* 19* 20* 18* 21* 18*  GLUCOSE 152* 118* 115* 145* 107* 122*  BUN 58* 47* 39* 43* 41* 36*  CREATININE 3.35* 2.87* 2.58* 2.96* 2.85* 2.80*  CALCIUM 9.1 9.1 8.8* 9.1 9.1 9.0  PHOS 3.7  --   --   --   --   --    GFR: Estimated Creatinine Clearance: 19.4 mL/min (A) (by C-G formula based on SCr of 2.8 mg/dL (H)). Liver Function Tests: Recent Labs  Lab 01/19/19 0456  ALBUMIN 2.2*   No results for input(s): LIPASE, AMYLASE in the last 168 hours. CBG: Recent Labs  Lab 01/23/19 1102 01/23/19 1639 01/23/19 2147 01/24/19 0729 01/24/19 1053  GLUCAP 134* 129* 145* 117* 154*   Urine analysis:    Component Value  Date/Time   COLORURINE YELLOW 01/20/2019 0649   APPEARANCEUR TURBID (A) 01/20/2019 0649   LABSPEC 1.008 01/20/2019 0649   PHURINE 5.0 01/20/2019 0649   GLUCOSEU NEGATIVE 01/20/2019 0649   HGBUR MODERATE (A) 01/20/2019 0649   BILIRUBINUR NEGATIVE 01/20/2019 0649   KETONESUR 5 (A) 01/20/2019 0649   PROTEINUR 100 (A) 01/20/2019 0649   NITRITE NEGATIVE 01/20/2019 0649   LEUKOCYTESUR LARGE (A) 01/20/2019 0649   Recent Results (from the past 240 hour(s))  Urine culture     Status: None   Collection Time: 01/16/19  9:51 PM  Result Value Ref Range Status   Specimen Description   Final    URINE, RANDOM Performed at [facility], [place] [address] [place], [place] [address]    Special Requests   Final    NONE Performed at [facility], [place] [address], [place], [place] [address]    Culture   Final    NO GROWTH Performed at [facility],  [address] [place], [place] [address]    Report Status 01/18/2019 FINAL  Final  Culture, blood (routine x 2)     Status: None   Collection Time: 01/16/19 10:41 PM  Result Value Ref Range Status   Specimen Description BLOOD LEFT ANTECUBITAL  Final   Special Requests   Final    BOTTLES DRAWN AEROBIC AND ANAEROBIC Blood Culture adequate volume Performed at [place] [address], [place], [place] [address]    Culture NO GROWTH 5 DAYS  Final   Report Status 01/22/2019 FINAL  Final  Respiratory Panel by PCR     Status: None   Collection Time: 01/17/19 12:23 AM  Result Value Ref Range Status   Adenovirus NOT DETECTED NOT DETECTED Final   Coronavirus 229E NOT DETECTED NOT DETECTED Final    Comment: (NOTE) The Coronavirus on the Respiratory Panel, DOES NOT test for the novel  Coronavirus (2019 nCoV)    Coronavirus HKU1 NOT DETECTED NOT DETECTED Final   Coronavirus NL63 NOT DETECTED NOT DETECTED Final   Coronavirus OC43 NOT DETECTED NOT DETECTED Final   Metapneumovirus NOT DETECTED  NOT DETECTED Final   Rhinovirus / Enterovirus NOT DETECTED NOT DETECTED Final   Influenza A NOT DETECTED NOT DETECTED Final   Influenza B NOT DETECTED NOT DETECTED Final   Parainfluenza Virus 1 NOT DETECTED NOT DETECTED Final   Parainfluenza Virus 2 NOT DETECTED NOT DETECTED Final   Parainfluenza Virus 3 NOT DETECTED NOT DETECTED Final   Parainfluenza Virus 4 NOT DETECTED NOT DETECTED Final   Respiratory Syncytial Virus NOT DETECTED NOT DETECTED Final   Bordetella pertussis NOT DETECTED NOT DETECTED Final   Chlamydophila pneumoniae NOT DETECTED NOT DETECTED Final   Mycoplasma pneumoniae NOT DETECTED NOT DETECTED Final    Comment: Performed at [facility], [place] [address] [place], [place] [address]  Culture, blood (Routine X 2) w Reflex to ID Panel     Status: None (Preliminary result)   Collection Time: 01/19/19 10:50 PM  Result Value Ref Range Status   Specimen Description BLOOD RIGHT HAND  Final   Special Requests   Final    BOTTLES DRAWN AEROBIC ONLY Blood Culture adequate volume Performed at [place] [address], [place], [place] [address]    Culture NO GROWTH 4 DAYS  Final   Report Status PENDING  Incomplete  Culture, blood (Routine X 2) w Reflex to ID Panel     Status: None (Preliminary result)   Collection Time: 01/19/19 10:50 PM  Result Value Ref Range Status   Specimen Description BLOOD RIGHT ANTECUBITAL  Final   Special Requests   Final    BOTTLES DRAWN AEROBIC AND ANAEROBIC Blood Culture adequate volume Performed at [facility], [place] [address] [place], [place] [address]    Culture NO GROWTH 4 DAYS  Final   Report Status PENDING  Incomplete  Body fluid culture     Status: None   Collection Time: 01/20/19 10:35 AM  Result Value Ref Range Status   Specimen Description SYNOVIAL LEFT KNEE  Final   Special Requests NONE  Final   Gram Stain   Final    RARE WBC PRESENT, PREDOMINANTLY PMN NO ORGANISMS SEEN Performed at [facility],  [place] [address] [place], [place] [address]    Culture NO GROWTH  Final   Report Status 01/23/2019 FINAL  Final      Radiology Studies: No results found.  Scheduled Meds: .  famotidine  10 mg Oral Daily  . ferrous sulfate  325 mg Oral BID WC  . fesoterodine  4 mg Oral QHS  . folic acid  1 mg Oral Daily  . heparin injection (subcutaneous)  5,000 Units Subcutaneous Q8H  . levothyroxine  75 mcg Oral QAC breakfast  . loratadine  10 mg Oral Daily  . mirabegron ER  50 mg Oral Daily  . polyethylene glycol  17 g Oral Daily  . senna  1 tablet Oral Daily  . tamsulosin  0.4 mg Oral Daily  . vitamin B-12  1,000 mcg Oral Daily   Continuous Infusions:    LOS: 7 days   Vernell Leep, MD, FACP, [facility]. [facility]  To contact the attending provider between 7A-7P or the covering provider during after hours 7P-7A, please log into the web site [email or web address] and access using universal [place] password for that web site. If you do not have the password, please call the hospital operator.

## 2019-01-24 NOTE — Progress Notes (Signed)
Offered to have patient to be sitted in recliner,she said'' I already been out from my bed by sitting a long time in bedside commode."R.N. explained that was a very short duration ,sitting in recliner is different and will bring some benefits for you,still the she refused.

## 2019-01-24 NOTE — Clinical Social Work Note (Signed)
Clinical Social Work Assessment  Patient Details  Name: Erika Fry MRN: [id number] Date of Birth: 02-Sep-1946  Date of referral:  01/24/19               Reason for consult:  Discharge Planning                Permission sought to share information with:  Case Manager Permission granted to share information::  Yes, Verbal Permission Granted  Name::     Trish Mage, Bertis Ruddy  Agency::  SNF  Relationship::  Sons and daughter in Financial trader Information:     Housing/Transportation Living arrangements for the past 2 months:  Single Family Home Source of Information:  Patient Patient Interpreter Needed:  None Criminal Activity/Legal Involvement Pertinent to Current Situation/Hospitalization:  No - Comment as needed Significant Relationships:  Adult Children Lives with:  Self Do you feel safe going back to the place where you live?  Yes(Family wants patient to rehab before she returns home) Need for family participation in patient care:  No (Coment)  Care giving concerns:    PT recommended SNF. Family is wanting to pursue CIR. They have spoken with the doctor. Family is agreeable to SNF if CIR placement is not able to occur.    Social Worker assessment / plan:    CSW met with patient and sons at bedside. Patient was alert and oriented x3. The family has spoken with the doctor and the doctor has put in PT orders for the patient to be re-evaluated for inpatient rehab. Family gave the CSW permission to fax out to [facility] and [facility] as a back up plan. Family is aware of the SNF process. The patient has been back to [facility] in the past and did not have a good experience.   Employment status:  Retired Forensic scientist:    PT Recommendations:  [place] / Referral to community resources:  [place]  Patient/Family's Response to care:  Family is supportive of the patient going to inpatient rehab. They are wanting to pursue  SNF as a backup plan. They are aware that the patient cannot return home because it would be unsafe.   Patient/Family's Understanding of and Emotional Response to Diagnosis, Current Treatment, and Prognosis:  Family is understanding and very adamant about the patient receiving the best care.   Emotional Assessment Appearance:    Attitude/Demeanor/Rapport:  Unable to Assess Affect (typically observed):  Unable to Assess Orientation:  Oriented to Self, Oriented to Place, Oriented to Situation Alcohol / Substance use:  Not Applicable Psych involvement (Current and /or in the community):  No (Comment)  Discharge Needs  Concerns to be addressed:  Discharge Planning Concerns Readmission within the last 30 days:  No Current discharge risk:  Dependent with Mobility Barriers to Discharge:  [place], Munjor 01/24/2019, 2:02 PM

## 2019-01-24 NOTE — NC FL2 (Signed)
[place] MEDICAID FL2 LEVEL OF CARE SCREENING TOOL     IDENTIFICATION  Patient Name: Erika Fry Birthdate: 1946-09-20 Sex: female Admission Date (Current Location): 01/16/2019  [facility] and [place] Number:  Herbalist and Address:  The [place]. [facility], [place] [address], [place], [place] [address]      Provider Number: [id number]  Attending Physician Name and Address:  Modena Jansky, MD  Relative Name and Phone Number:  [place], Radziewicz [phone], [place], [place], [phone], Fedra Lanter, Son, [phone]    Current Level of Care:   Recommended Level of Care: [place] Prior Approval Number: [id number]  Date Approved/Denied: 05/01/18 PASRR Number:    Discharge Plan: SNF    Current Diagnoses: Patient Active Problem List   Diagnosis Date Noted  . Fever 01/17/2019  . Acute lower UTI 01/17/2019  . Pressure injury of skin 01/17/2019  . Gross hematuria 01/14/2019  . Fluid retention   . Generalized edema   . Hypokalemia   . Acute right-sided low back pain with right-sided sciatica   . Acute blood loss anemia   . Anemia of chronic disease   . Thrombocytopenia ([place])   . Diabetes mellitus ([place])   . Morbid obesity ([place])   . Benign essential HTN   . Hypoalbuminemia due to protein-calorie malnutrition ([place])   . Occult blood in stools   . Gastric polyp   . Lumbar discitis 05/17/2018  . Blood loss anemia 05/12/2018  . Hyperlipidemia associated with type 2 diabetes mellitus ([place]) 05/10/2018  . Essential hypertension 04/29/2018  . CKD (chronic kidney disease), stage IV ([place])   . Diet-controlled diabetes mellitus ([place])   . GERD (gastroesophageal reflux disease)   . Gout   . High cholesterol   . Hypothyroidism   . Iron deficiency anemia   . AV block, Mobitz 2 03/25/2018  . Mobitz type 2 second degree AV block 03/20/2018  . Aortic stenosis 03/20/2018  . Exertional dyspnea 03/19/2018    Orientation  RESPIRATION BLADDER Height & Weight     Self, Situation, Place  O2(100, , 3) Incontinent(Uretheral cath) Weight: 195 lb 11.2 oz (88.8 kg) Height:  5\' 5"  (165.1 cm)  BEHAVIORAL SYMPTOMS/MOOD NEUROLOGICAL BOWEL NUTRITION STATUS           AMBULATORY STATUS COMMUNICATION OF NEEDS Skin   Independent Verbally Other (Comment), Skin abrasions(Pressure injury and dry skin)                       Personal Care Assistance Level of Assistance  Bathing, Feeding, Dressing, Total care Bathing Assistance: Maximum assistance Feeding assistance: Independent Dressing Assistance: Maximum assistance Total Care Assistance: Maximum assistance   Functional Limitations Info  Sight, Hearing, Speech Sight Info: Impaired(Wears glasses) Hearing Info: Adequate Speech Info: Adequate    SPECIAL CARE FACTORS FREQUENCY  PT (By licensed PT), OT (By licensed OT)     PT Frequency: 5x/wk OT Frequency: 5x/wk            Contractures Contractures Info: Not present    Additional Factors Info  Code Status, Allergies Code Status Info: Full Code Allergies Info: Baclofen, Codeine           Current Medications (01/24/2019):  This is the current hospital active medication list Current Facility-Administered Medications  Medication Dose Route Frequency Provider Last Rate Last Dose  . acetaminophen (TYLENOL) tablet 650 mg  650 mg Oral Q6H PRN Phillips Grout, MD   650 mg at  01/23/19 2227   Or  . acetaminophen (TYLENOL) suppository 650 mg  650 mg Rectal Q6H PRN Phillips Grout, MD      . famotidine (PEPCID) tablet 10 mg  10 mg Oral Daily Derrill Kay A, MD   10 mg at 01/24/19 1056  . ferrous sulfate tablet 325 mg  325 mg Oral BID WC Phillips Grout, MD   325 mg at 01/24/19 1610  . fesoterodine (TOVIAZ) tablet 4 mg  4 mg Oral QHS Derrill Kay A, MD   4 mg at 01/23/19 2228  . folic acid (FOLVITE) tablet 1 mg  1 mg Oral Daily Derrill Kay A, MD   1 mg at 01/24/19 1055  . heparin injection 5,000 Units  5,000  Units Subcutaneous Q8H [name], [name], MD   5,000 Units at 01/24/19 0534  . levothyroxine (SYNTHROID, LEVOTHROID) tablet 75 mcg  75 mcg Oral QAC breakfast Phillips Grout, MD   75 mcg at 01/24/19 0534  . lidocaine (XYLOCAINE) 5 % ointment 1 application  1 application Topical PRN Derrill Kay A, MD      . loratadine (CLARITIN) tablet 10 mg  10 mg Oral Daily Hongalgi, Anand D, MD      . mirabegron ER (MYRBETRIQ) tablet 50 mg  50 mg Oral Daily Derrill Kay A, MD   50 mg at 01/24/19 1105  . polyethylene glycol (MIRALAX / GLYCOLAX) packet 17 g  17 g Oral Daily Modena Jansky, MD   17 g at 01/22/19 1720  . senna (SENOKOT) tablet 8.6 mg  1 tablet Oral Daily Hongalgi, Anand D, MD   8.6 mg at 01/24/19 1055  . tamsulosin (FLOMAX) capsule 0.4 mg  0.4 mg Oral Daily Hongalgi, Anand D, MD      . URELLE (URELLE/URISED) 81 MG tablet 81 mg  1 tablet Oral Q6H PRN Schorr, Rhetta Mura, NP   81 mg at 01/22/19 2219  . vitamin B-12 (CYANOCOBALAMIN) tablet 1,000 mcg  1,000 mcg Oral Daily Phillips Grout, MD   1,000 mcg at 01/24/19 1056     Discharge Medications: Please see discharge summary for a list of discharge medications.  Relevant Imaging Results:  Relevant Lab Results:   Additional Information SSI: [id number]  Philippa Chester Kiasha Bellin, LCSWA

## 2019-01-24 NOTE — Progress Notes (Signed)
850 cc of amber color fluid drained from chest catheter.Patient complained of cramping pain on the site when fluid reached to 850 cc level.Cramping gradually subsided,tried to re-start slowly but the cramping start again.I stopped and closed the  Catheter.Patient not on distress.Will monitor.

## 2019-01-25 DIAGNOSIS — D649 Anemia, unspecified: Secondary | ICD-10-CM

## 2019-01-25 DIAGNOSIS — D131 Benign neoplasm of stomach: Secondary | ICD-10-CM

## 2019-01-25 DIAGNOSIS — Z87440 Personal history of urinary (tract) infections: Secondary | ICD-10-CM

## 2019-01-25 DIAGNOSIS — R195 Other fecal abnormalities: Secondary | ICD-10-CM

## 2019-01-25 DIAGNOSIS — K921 Melena: Secondary | ICD-10-CM

## 2019-01-25 LAB — CULTURE, BLOOD (ROUTINE X 2)
Culture: NO GROWTH
Culture: NO GROWTH
Special Requests: ADEQUATE
Special Requests: ADEQUATE

## 2019-01-25 LAB — RETICULOCYTES
IMMATURE RETIC FRACT: 16.7 % — AB (ref 2.3–15.9)
RBC.: 2.57 MIL/uL — ABNORMAL LOW (ref 3.87–5.11)
Retic Count, Absolute: 57.3 10*3/uL (ref 19.0–186.0)
Retic Ct Pct: 2.2 % (ref 0.4–3.1)

## 2019-01-25 LAB — CBC
HCT: 23.8 % — ABNORMAL LOW (ref 36.0–46.0)
HCT: 24.5 % — ABNORMAL LOW (ref 36.0–46.0)
HEMATOCRIT: 21 % — AB (ref 36.0–46.0)
Hemoglobin: 6.7 g/dL — CL (ref 12.0–15.0)
Hemoglobin: 7.3 g/dL — ABNORMAL LOW (ref 12.0–15.0)
Hemoglobin: 7.6 g/dL — ABNORMAL LOW (ref 12.0–15.0)
MCH: 29.3 pg (ref 26.0–34.0)
MCH: 28.4 pg (ref 26.0–34.0)
MCH: 29 pg (ref 26.0–34.0)
MCHC: 31.9 g/dL (ref 30.0–36.0)
MCHC: 30.7 g/dL (ref 30.0–36.0)
MCHC: 31 g/dL (ref 30.0–36.0)
MCV: 91.7 fL (ref 80.0–100.0)
MCV: 92.6 fL (ref 80.0–100.0)
MCV: 93.5 fL (ref 80.0–100.0)
NRBC: 0 % (ref 0.0–0.2)
PLATELETS: 332 10*3/uL (ref 150–400)
Platelets: 299 10*3/uL (ref 150–400)
Platelets: 302 10*3/uL (ref 150–400)
RBC: 2.29 MIL/uL — ABNORMAL LOW (ref 3.87–5.11)
RBC: 2.57 MIL/uL — AB (ref 3.87–5.11)
RBC: 2.62 MIL/uL — ABNORMAL LOW (ref 3.87–5.11)
RDW: 13.7 % (ref 11.5–15.5)
RDW: 13.6 % (ref 11.5–15.5)
RDW: 13.8 % (ref 11.5–15.5)
WBC: 10.5 10*3/uL (ref 4.0–10.5)
WBC: 11.4 10*3/uL — ABNORMAL HIGH (ref 4.0–10.5)
WBC: 10.3 10*3/uL (ref 4.0–10.5)
nRBC: 0 % (ref 0.0–0.2)
nRBC: 0 % (ref 0.0–0.2)

## 2019-01-25 LAB — FOLATE: Folate: 27.4 ng/mL (ref >5.9)

## 2019-01-25 LAB — BASIC METABOLIC PANEL
Anion gap: 9 (ref 5–15)
BUN: 30 mg/dL — ABNORMAL HIGH (ref 8–23)
CHLORIDE: 103 mmol/L (ref 98–111)
CO2: 20 mmol/L — ABNORMAL LOW (ref 22–32)
CREATININE: 2.54 mg/dL — AB (ref 0.44–1.00)
Calcium: 9 mg/dL (ref 8.9–10.3)
GFR calc Af Amer: 21 mL/min — ABNORMAL LOW (ref >60)
GFR calc non Af Amer: 18 mL/min — ABNORMAL LOW (ref >60)
Glucose, Bld: 138 mg/dL — ABNORMAL HIGH (ref 70–99)
Potassium: 3.6 mmol/L (ref 3.5–5.1)
Sodium: 132 mmol/L — ABNORMAL LOW (ref 135–145)

## 2019-01-25 LAB — GLUCOSE, CAPILLARY
Glucose-Capillary: 122 mg/dL — ABNORMAL HIGH (ref 70–99)
Glucose-Capillary: 140 mg/dL — ABNORMAL HIGH (ref 70–99)
Glucose-Capillary: 146 mg/dL — ABNORMAL HIGH (ref 70–99)
Glucose-Capillary: 145 mg/dL — ABNORMAL HIGH (ref 70–99)

## 2019-01-25 LAB — IRON AND TIBC
Iron: 20 ug/dL — ABNORMAL LOW (ref 28–170)
Saturation Ratios: 12 % (ref 10.4–31.8)
TIBC: 171 ug/dL — ABNORMAL LOW (ref 250–450)
UIBC: 151 ug/dL

## 2019-01-25 LAB — VITAMIN B12: Vitamin B-12: 2132 pg/mL — ABNORMAL HIGH (ref 180–914)

## 2019-01-25 LAB — FERRITIN: FERRITIN: 336 ng/mL — AB (ref 11–307)

## 2019-01-25 MED ORDER — POLYETHYLENE GLYCOL 3350 17 G PO PACK: 17.0000 g | PACK | Freq: Every day | ORAL | Status: DC | PRN

## 2019-01-25 MED ORDER — PANTOPRAZOLE SODIUM 40 MG PO TBEC
40.0000 mg | DELAYED_RELEASE_TABLET | Freq: Two times a day (BID) | ORAL | Status: DC
  Administered 2019-01-25 – 2019-01-29 (×9): 40 mg via ORAL
  Filled 2019-01-25 (×9): qty 1

## 2019-01-25 NOTE — Progress Notes (Signed)
Inpatient Rehabilitation Admissions Coordinator  Inpatient acute rehab consult received. I met with patient at bedside for rehab assessment. Pt previously at [facility] 05/2018 and discharged home with daughter in law, Museum/gallery conservator, [place]. Pt prefers an inpt rehab admission rather than SNF. I will begin insurance authorization with [facility] for a possible admit. I will follow up tomorrow.  Danne Baxter, RN, MSN Rehab Admissions Coordinator [phone] 01/25/2019 12:38 PM

## 2019-01-25 NOTE — Progress Notes (Signed)
PROGRESS NOTE  Erika Fry  VWU:[id number] DOB: [birth year] DOA: 01/16/2019 PCP: [name], MD   Brief Narrative: 73 y.o.femalewith medical history significantfor stage IV CKD, hypertension, diabetes, L4-L5 discitis, with recent discharge from urology service who presented for poor per oral intake, pelvic pain, lethargy. Work up demonstrated BUN of 99, and creatinine of 6, with pyuria on UA and dysuria. Ceftriaxone was started and nephrology was consulted.  Acute kidney injury improving.  Ongoing chronic pelvic pain.  ID consulted for FUO.  Extensive work-up thus far nonrevealing and she continues to have fevers intermittently.  Assessment & Plan: Principal Problem:   Fever Active Problems:   Aortic stenosis   CKD (chronic kidney disease), stage IV (HCC)   Anemia of chronic disease   Benign essential HTN   Acute lower UTI   Pressure injury of skin  Fever:  -On admission due to UTI symptoms and positive UA, treated with ceftriaxone, urine cultures however were negative and completed 3 days course of ceftriaxone 2/15- 2/17.  -Due to ongoing fevers, ID was consulted and patient underwent extensive evaluation. -CRP 20, ESR 98, CT abdomen and pelvis without contrast 2/18: L4-5 discitis/osteomyelitis similar to prior lumbar MRI, probable gallstones without cholecystitis, right ureteral stent with mild right hydronephrosis and hydroureter. -Orthopedics performed left knee diagnostic arthrocentesis: Synovial fluid showed no crystals, WBCs 255, 61% neutrophils, rest lymphocytes and monocytes.  Synovial fluid culture negative, final. - MRI left knee 2/20: No discrete evidence of soft tissue infection about the knee, medial meniscus tear and tricompartmental osteoarthritis.  Left knee pain has improved after arthrocentesis and patient was able to ambulate in the room with the help of walker and PT supervision on 2/21. - Repeated blood cultures 2/18 x 2: Negative to date. - Lumbar spine  MRI shows resolution of L4-5 discitis and osteomyelitis. - Had fever of 102.9 on night of 2/20.  Afebrile now for greater than 72 hours.  Monitoring off of antibiotics.  ID continues to see.  Left knee pain Unclear etiology.  Management as noted above.  Pain improved and able to weight-bear and ambulate some with PT.  AKI on stage IV CKD:  As per nephrology signed off 2/19, patient's recent baseline creatinine was progressing and was 5.3 prior to urological procedures.  She follows with Dr. Johnney Ou at [place]. -Acute kidney injury was felt due to dehydration and obstructive etiology/hydronephrosis. -Post IV fluid hydration and right ureteral stent placement and lithotripsy on 01/14/2019, creatinine has steadily improved to 2.58.  Continue to follow BMP. -Outpatient follow-up with Dr. Johnney Ou, nephrology will arrange. -Creatinine has crept up from 2.58-2.96.  Remains on IV fluids.  Etiology not clear.  Foley removed 2/20.  Check bladder scan to make sure she does not have urinary retention.  Creatinine has plateaued at 2.8 range over the last couple of days which may be her new baseline.  Creatinine down today to 2.5.  Follow-up BMP periodically and closely.  NAGMA: Changed to LR -Bicarbonate hovering in the 18-20 range for the last few days.  Bicarbonate continues to fluctuate.  Discontinue LR.?  Consider sodium bicarbonate tablets.  Follow BMP.  Acute metabolic encephalopathy: Possibly due to uremia vs. FUO.  -Resolved.  Anemia of chronic disease:  -Hemoglobin stable in the 7.5-8 range for the last several days.  Transfuse if hemoglobin 7 or less.  No bleeding reported.  Continue ferrous sulfate, B12 and folate supplements. -Due to history of dark stools (suspect from iron supplements and less likely due to GI bleed),  check hemoglobin and remained stable.  FOBT +.   -Hemoglobin this morning was 6.7 (down from 7.7, possibly lab error) but repeat hemoglobin 7.3. -Concern for slow GI bleed.  [facility]  consulted and planning evaluation as noted below. -Follow CBCs closely including this evening and transfuse if hemoglobin 7 or less.  Patient agreeable to transfusion if needed.  Benign essential HTN:  - Continue home medications.  Mildly controlled at times.  Stage I pressure injury bilateral buttocks. Not known POA status.  - Offload as able.  Obesity/Body mass index is 32.57 kg/m. - Weight loss long term recommended.  Chronic pelvic pain Status post cystoscopy, hydrodistention, right ureteral stent placement and lithotripsy by Dr. Louis Meckel, urology on 01/14/2018.  Had ongoing severe PV pain on 2/20 and patient stated "I want the stent out".  I discussed with Dr. Louis Meckel on 2/20, DC Foley to see if that will help her pain.  Urine greenish discoloration is from [place].  Continue Myrbetriq, Toviaz and [employer]. Following removal of Foley catheter on 2/20 and multiple BMs on 2/21, vaginal pain significantly improved, now complains of occasional dysuria, rectal pain/foreign body sensation also seems to have resolved.   Reportedly has chronic dysuria since before Thanksgiving last year.  Etiology unclear.?  Interstitial cystitis.  Unable to use Pyridium due to renal insufficiency.  Do not know if have many options.  I discussed with urologist on call 2/23 who recommended trial of tamsulosin 0.4 mg daily which may help with urinary stent related dysuria. Dysuria seems to be somewhat better after initiation of Flomax.  Continue to monitor.  L4-5 discitis/osteomyelitis Has completed course of IV followed by oral antibiotics.  Management per ID.  Lumbar spine MRI shows resolution.  DM2 A1c 6.3 on 04/30/2018.  Does not appear that she was on medications PTA/possibly diet controlled.  CBGs show good inpatient control.  GERD Due to concern for possible slow GI bleed, just [facility] has changed Pepcid to PPI.  Hypothyroid Continue Synthroid.  S/p PPM.  Hypokalemia: Replaced.  "Dark stools"/FOBT  + Following bowel regimen , patient having "dark stools". Suspect due to iron supplements.  FOBT positive but hemoglobin stable.  Had EGD June 2019 when she had a friable polyp removed but was unable to follow-up for colonoscopy.  Due to drop further in hemoglobin, ongoing "dark stools" which I personally witnessed and did not look like melena but rather than somebody on iron supplements, does have FOBT +, consulted [facility] and their input appreciated.  Concerned that she may be having recurrent oozing from prior gastric polyp area and plan EGD in a.m. and do not think that she can tolerate colonoscopy at this time.   DVT prophylaxis: Discontinued subcutaneous heparin and started SCDs. Code Status: Full Family Communication: Discussed in detail with patient's other son and his wife who is a Therapist, sports, at bedside.  Updated care and answer questions. Disposition Plan: DC to SNF/CIR pending clinical improvement and no further fevers and pending GI work-up as above.  Patient and family interested in inpatient rehab, consult placed.  Consultants:   Nephrology  Urology  Orthopedics  [facility]  Procedures:   Foley catheter-discontinued 2/20  Antimicrobials:  Ceftriaxone-discontinued  Pathology: 01/14/2019: Diagnosis 1. Bladder, biopsy, right lateral wall BENIGN UROTHELIUM AND SUBMUCOSA WITH PROMINENT CHRONIC INFLAMMATION, TELANGIECTASIA AND REACTIVE CHANGES 2. Bladder, biopsy, posterior wall MOSTLY DENUDED SUBMUCOSA WITH PROMINENT INFLAMMATION, HEMORRHAGE AND REACTIVE CHANGES 3. Bladder, biopsy, left lateral wall FIBRIN AND INFLAMMATORY CELLS 4. Bladder, biopsy, middle trigone BENIGN  UROTHELIUM AND SUBMUCOSA WITH PROMINENT FOLLICULAR CYSTITIS AND REACTIVE CHANGES  Cytology 01/14/2019:  Diagnosis URINE CYTOLOGY, CATHETERIZED, RIGHT UPPER TRACT(SPECIMEN 1 OF 1 COLLECTED 01/14/19): NEGATIVE FOR HIGH GRADE UROTHELIAL CARCINOMA. ACUTE INFLAMMATION.  Subjective: Patient interviewed  and examined in the presence of her son and daughter-in-law at bedside.  Overall feels much better, improving strength and ambulation, feels like she can improve at inpatient rehab.  No rectal pain.  Having BMs, still dark.  No red blood noted.  Dysuria somewhat better compared to yesterday.  Objective: Vitals:   01/24/19 1610 01/24/19 2033 01/25/19 0432 01/25/19 0923  BP: (!) 147/71 (!) 155/82 112/64 113/62  Pulse: 83 87 85 83  Resp: 18 18 20  (!) 22  Temp: 98.4 F (36.9 C) 99 F (37.2 C) 98.5 F (36.9 C) 97.9 F (36.6 C)  TempSrc: Oral Oral Oral Oral  SpO2: 100% 100% 99% 99%  Weight:      Height:        Intake/Output Summary (Last 24 hours) at 01/25/2019 1535 Last data filed at 01/25/2019 0930 Gross per 24 hour  Intake 400 ml  Output -  Net 400 ml   Filed Weights   01/20/19 2133 01/22/19 1944 01/23/19 2145  Weight: 83.8 kg 83.9 kg 88.8 kg   Gen: Pleasant elderly female, moderately built and obese sitting up comfortably in chair this morning Pulm: Clear to auscultation.  No increased work of breathing.  Stable CV: S1 and S2 heard, RRR.  No JVD, murmurs or pedal edema.  Stable. GI: Abdomen is nondistended, soft, nontender.  No organomegaly or masses appreciated.  Normal bowel sounds heard.  Stable PV: As examined on 2/22 along with female NT as chaperone in the room.  Mild dried dark green stools inferior labial folds.  No other acute findings to explain dysuria. Ext: No lower extremity edema.  Left knee without swelling, redness, warmth or tenderness.  However has mild painful range of movements. Skin: No new rashes, lesions or ulcers on visualized skin. Neuro: Alert and oriented.  No focal neurological deficits.  Stable. Psych: Judgement and insight appear fair. Mood euthymic & affect congruent. Behavior is appropriate.    Data Reviewed: I have personally reviewed following labs and imaging studies  CBC: Recent Labs  Lab 01/20/19 0356 01/23/19 1410 01/24/19 1225  01/25/19 0522 01/25/19 0802  WBC 8.3 9.5 9.1 10.5 11.4*  HGB 8.0* 7.7* 7.7* 6.7* 7.3*  HCT 24.3* 24.8* 24.3* 21.0* 23.8*  MCV 90.7 92.5 93.8 91.7 92.6  PLT 189 277 278 299 161   Basic Metabolic Panel: Recent Labs  Lab 01/19/19 0456  01/21/19 0759 01/22/19 0634 01/23/19 0836 01/24/19 0332 01/25/19 0522  NA 134*   < > 133* 135 132* 133* 132*  K 3.2*   < > 3.2* 4.0 3.8 3.4* 3.6  CL 102   < > 103 103 100 103 103  CO2 18*   < > 20* 18* 21* 18* 20*  GLUCOSE 152*   < > 115* 145* 107* 122* 138*  BUN 58*   < > 39* 43* 41* 36* 30*  CREATININE 3.35*   < > 2.58* 2.96* 2.85* 2.80* 2.54*  CALCIUM 9.1   < > 8.8* 9.1 9.1 9.0 9.0  PHOS 3.7  --   --   --   --   --   --    < > = values in this interval not displayed.   GFR: Estimated Creatinine Clearance: 21.4 mL/min (A) (by C-G formula based on SCr of  2.54 mg/dL (H)). Liver Function Tests: Recent Labs  Lab 01/19/19 0456  ALBUMIN 2.2*   No results for input(s): LIPASE, AMYLASE in the last 168 hours. CBG: Recent Labs  Lab 01/24/19 1053 01/24/19 1607 01/24/19 2133 01/25/19 0731 01/25/19 1119  GLUCAP 154* 144* 126* 122* 140*   Urine analysis:    Component Value Date/Time   COLORURINE YELLOW 01/20/2019 0649   APPEARANCEUR TURBID (A) 01/20/2019 0649   LABSPEC 1.008 01/20/2019 0649   PHURINE 5.0 01/20/2019 0649   GLUCOSEU NEGATIVE 01/20/2019 0649   HGBUR MODERATE (A) 01/20/2019 0649   BILIRUBINUR NEGATIVE 01/20/2019 0649   KETONESUR 5 (A) 01/20/2019 0649   PROTEINUR 100 (A) 01/20/2019 0649   NITRITE NEGATIVE 01/20/2019 0649   LEUKOCYTESUR LARGE (A) 01/20/2019 0649   Recent Results (from the past 240 hour(s))  Urine culture     Status: None   Collection Time: 01/16/19  9:51 PM  Result Value Ref Range Status   Specimen Description   Final    URINE, RANDOM Performed at [facility], [place] [address] [place], [place] [address]    Special Requests   Final    NONE Performed at [facility], [place] [address] [place], [place] [address]    Culture   Final    NO GROWTH Performed at [facility], [place] [address], [place], [place] [address]    Report Status 01/18/2019 FINAL  Final  Culture, blood (routine x 2)     Status: None   Collection Time: 01/16/19 10:41 PM  Result Value Ref Range Status   Specimen Description BLOOD LEFT ANTECUBITAL  Final   Special Requests   Final    BOTTLES DRAWN AEROBIC AND ANAEROBIC Blood Culture adequate volume Performed at [place] [address], [place], [place] [address]    Culture NO GROWTH 5 DAYS  Final   Report Status 01/22/2019 FINAL  Final  Respiratory Panel by PCR     Status: None   Collection Time: 01/17/19 12:23 AM  Result Value Ref Range Status   Adenovirus NOT DETECTED NOT DETECTED Final   Coronavirus 229E NOT DETECTED NOT DETECTED Final    Comment: (NOTE) The Coronavirus on the Respiratory Panel, DOES NOT test for the novel  Coronavirus (2019 nCoV)    Coronavirus HKU1 NOT DETECTED NOT DETECTED Final   Coronavirus NL63 NOT DETECTED NOT DETECTED Final   Coronavirus OC43 NOT DETECTED NOT DETECTED Final   Metapneumovirus NOT DETECTED NOT DETECTED Final   Rhinovirus / Enterovirus NOT DETECTED NOT DETECTED Final   Influenza A NOT DETECTED NOT DETECTED Final   Influenza B NOT DETECTED NOT DETECTED Final   Parainfluenza Virus 1 NOT DETECTED NOT DETECTED Final   Parainfluenza Virus 2 NOT DETECTED NOT DETECTED Final   Parainfluenza Virus 3 NOT DETECTED NOT DETECTED Final   Parainfluenza Virus 4 NOT DETECTED NOT DETECTED Final   Respiratory Syncytial Virus NOT DETECTED NOT DETECTED Final   Bordetella pertussis NOT DETECTED NOT DETECTED Final   Chlamydophila pneumoniae NOT DETECTED NOT DETECTED Final   Mycoplasma pneumoniae NOT DETECTED NOT DETECTED Final    Comment: Performed at [facility], [place] [address] [place], [place] [address]  Culture, blood (Routine X 2) w Reflex to ID Panel      Status: None   Collection Time: 01/19/19 10:50 PM  Result Value Ref Range Status   Specimen Description BLOOD RIGHT HAND  Final   Special Requests   Final  BOTTLES DRAWN AEROBIC ONLY Blood Culture adequate volume   Culture   Final    NO GROWTH 5 DAYS Performed at [facility], [place] [address], [place], [place] [address]    Report Status 01/25/2019 FINAL  Final  Culture, blood (Routine X 2) w Reflex to ID Panel     Status: None   Collection Time: 01/19/19 10:50 PM  Result Value Ref Range Status   Specimen Description BLOOD RIGHT ANTECUBITAL  Final   Special Requests   Final    BOTTLES DRAWN AEROBIC AND ANAEROBIC Blood Culture adequate volume   Culture   Final    NO GROWTH 5 DAYS Performed at [facility], [place] [address] [place], [place] [address]    Report Status 01/25/2019 FINAL  Final  Body fluid culture     Status: None   Collection Time: 01/20/19 10:35 AM  Result Value Ref Range Status   Specimen Description SYNOVIAL LEFT KNEE  Final   Special Requests NONE  Final   Gram Stain   Final    RARE WBC PRESENT, PREDOMINANTLY PMN NO ORGANISMS SEEN Performed at [facility], [place] [address] [place], [place] [address]    Culture NO GROWTH  Final   Report Status 01/23/2019 FINAL  Final      Radiology Studies: No results found.  Scheduled Meds: . ferrous sulfate  325 mg Oral BID WC  . fesoterodine  4 mg Oral QHS  . folic acid  1 mg Oral Daily  . levothyroxine  75 mcg Oral QAC breakfast  . loratadine  10 mg Oral Daily  . mirabegron ER  50 mg Oral Daily  . pantoprazole  40 mg Oral BID  . polyethylene glycol  17 g Oral Daily  . senna  1 tablet Oral Daily  . tamsulosin  0.4 mg Oral Daily  . vitamin B-12  1,000 mcg Oral Daily   Continuous Infusions:    LOS: 8 days   Vernell Leep, MD, FACP, [facility]  To contact the attending provider between 7A-7P or the covering provider during after hours 7P-7A, please log into the web site  [email or web address] and access using universal [place] password for that web site. If you do not have the password, please call the hospital operator.

## 2019-01-25 NOTE — Progress Notes (Signed)
Physical Therapy Treatment Patient Details Name: Erika Fry MRN: [id number] DOB: 05-13-1946 Today's Date: 01/25/2019    History of Present Illness Pt admitted with UTI and fever; recent d/c from urology. PMH of lumbar spine osteomyelitis/discitis, CKD, HTN, DM, pacemaker.    PT Comments    Pt progressing towards physical therapy goals. Pt and family with many questions regarding CIR vs SNF at d/c - discussed with MD who asks PT to see today for re-assessment. Although tolerance for functional activity is decreased this session, feel pt could likely tolerate the increased frequency and intensity of therapy available at CIR. Pt likely fatigued from bathroom trip prior to gait training this morning. Overall pt is at a min assist level with RW for safe functional mobility. Updated PT recommendations to CIR as pt could benefit to maximize functional independence and safety prior to return home. Will continue to follow and progress as able per POC.    Follow Up Recommendations  CIR;Supervision/Assistance - 24 hour     Equipment Recommendations  Other (comment)(TBD by next venue of care)    Recommendations for Other Services Rehab consult     Precautions / Restrictions Precautions Precautions: Fall Restrictions Weight Bearing Restrictions: No    Mobility  Bed Mobility               General bed mobility comments: Pt was received in bathroom, standing at toilet with NT performing peri-care for pt.   Transfers Overall transfer level: Needs assistance Equipment used: Rolling walker (2 wheeled) Transfers: Sit to/from Stand Sit to Stand: Min assist         General transfer comment: VC's for hand placement on seated surface for safety. Pt required assist for controlled descent to chair. Once sitting, pt required increased time and cues to adjust herself so she was sitting straight in chair and not leaning R.  Ambulation/Gait Ambulation/Gait assistance: Min assist Gait  Distance (Feet): 175 Feet Assistive device: Rolling walker (2 wheeled) Gait Pattern/deviations: Step-through pattern Gait velocity: Decreased Gait velocity interpretation: 1.31 - 2.62 ft/sec, indicative of limited community ambulator General Gait Details: Pt required several standing rest breaks during gait training 2 UE fatigue from leaning on RW. VC's for improved posture and less weight bearing through UE's, however pt only able to maintain corrective changes for short bouts. Intermittent assist required for balance support and walker management.    Stairs             Wheelchair Mobility    Modified Rankin (Stroke Patients Only)       Balance Overall balance assessment: Needs assistance Sitting-balance support: Bilateral upper extremity supported;Feet supported Sitting balance-Leahy Scale: Fair Sitting balance - Comments: Initially poor as pt had difficulty finding midline and maintaining upright posture after gait training (fatigue?) Postural control: Right lateral lean Standing balance support: Bilateral upper extremity supported Standing balance-Leahy Scale: Poor Standing balance comment: reliance on the AD                            Cognition Arousal/Alertness: Awake/alert Behavior During Therapy: WFL for tasks assessed/performed Overall Cognitive Status: Within Functional Limits for tasks assessed                                        Exercises      General Comments        Pertinent Vitals/Pain  Pain Assessment: Faces Faces Pain Scale: No hurt Pain Intervention(s): Monitored during session    Home Living                      Prior Function            PT Goals (current goals can now be found in the care plan section) Acute Rehab PT Goals Patient Stated Goal: CIR before home PT Goal Formulation: With patient Time For Goal Achievement: 02/02/19 Potential to Achieve Goals: Fair Progress towards PT goals:  Progressing toward goals    Frequency    Min 3X/week      PT Plan Discharge plan needs to be updated    Co-evaluation              AM-PAC PT "6 Clicks" Mobility   Outcome Measure  Help needed turning from your back to your side while in a flat bed without using bedrails?: A Little Help needed moving from lying on your back to sitting on the side of a flat bed without using bedrails?: A Little Help needed moving to and from a bed to a chair (including a wheelchair)?: A Little Help needed standing up from a chair using your arms (e.g., wheelchair or bedside chair)?: A Little Help needed to walk in hospital room?: A Little Help needed climbing 3-5 steps with a railing? : Total 6 Click Score: 16    End of Session Equipment Utilized During Treatment: Gait belt Activity Tolerance: Patient limited by fatigue Patient left: in chair;with call bell/phone within reach;with chair alarm set;with family/visitor present(MD present) Nurse Communication: Mobility status PT Visit Diagnosis: Other abnormalities of gait and mobility (R26.89);Muscle weakness (generalized) (M62.81);Pain     Time: 1660-6301 PT Time Calculation (min) (ACUTE ONLY): 13 min  Charges:  $Gait Training: 8-22 mins                     Rolinda Roan, PT, DPT Acute Rehabilitation Services Pager: [phone] Office: [phone]    Thelma Comp 01/25/2019, 10:29 AM

## 2019-01-25 NOTE — Consult Note (Signed)
Referring Provider: [facility]  Primary Care Physician:  [name], MD Primary Gastroenterologist:  unassigned     Reason for Consultation:  anemia    ASSESSMENT / PLAN:    5.  73 year old female with mild progression of chronic normocytic anemia and dark, heme positive stool.  Baseline hemoglobin over the last several months has been in the 8-10 range, now at 7.3. Stools have not been dark at home (on iron) but now they have become dark though not black.  She was taking daily baby aspirin at home -Similar scenario in June 2019 which time a 10 mm pedunculated friable polyp was found in the pylorus.  Polyp was biopsied but not removed . She has unable to return to our office for follow-up EGD and a colonoscopy.  -Her stools are heme positive but not black. She has had a mild progression of chronic anemia. The large gastric polyp may be oozing again. She will need repeat EGD but doubt she is physically able to undergo colonoscopy this admission.  -can eat today. Will need to figure out timing of procedure. -due to history of friable gastric polyp will start protonix and hold the pepcid.  -transfuse prn  2. Recent FUO, resolved    HPI:      HPI: Erika Fry is a 73 y.o. female with GERD, DM, HTN hypothyroidism gout, pacemaker for heart block, CKD IV, history of iron deficiency anemia. She was recently discharged from hospital after admission for UTI, pelvic pain and kidney stone s/p uro stent. Patient readmitted a day or two later with with fever, confusion and AKi on CKD. Treated for UTI,  follow up urine cultures negative. Continued to have fevers, ID evaluated. Has had left knee pain, Ortho evaluated and aspirated knee.  MRI of the knee was unrevealing , knee fluid studies negative but had received antibiotic prior to aspiration. Afebrile for last few days.   We saw her in June 2019 for evaluation of anemia and heme + stools.  EGD showed an oozing gastric polyp.   The polyp was 10 mm in size, it was biopsied but not removed.  Pathology negative for malignancy or H. pylori.  Plan was for outpatient repeat EGD as well as colonoscopy for evaluation of anemia.  Patient did not follow-up in our office, she apparently canceled the procedures due to back pain from diskitis.  Patient says she takes oral iron at home, unsure of how long she has been on it but possibly since hospital discharge in June 2019.  Her stools were not extremely dark nor black at home but in the hospital they have become very dark.  She has chronic normocytic anemia with baseline hemoglobin ranging from 8-10.  This admission hemoglobin has gradually declined to 6.7 yesterday. Repeat hgb today at 7.3. She was taking a daily asa at home, denies other NSAIDS. No abdominal pain, no N/V  EGD June 2019 - heme + anemia  Normal esophagus. - A medium amount of food (residue) in the proximal stomach. - A 10 mm pedunculated gastric polyp prolapsing in / out of pylorus and duodenal bulb. It was friable, oozing. Not removed but biopsied. Path = ulcerated, hyperplastic polyp  Past Medical History:  Diagnosis Date  . Arthritis    "knees, thumbs" (03/25/2018)  . CKD (chronic kidney disease), stage IV ([place])   . Diet-controlled diabetes mellitus ([place])   . GERD (gastroesophageal reflux disease)   . Gout    "on daily RX" (03/25/2018)  .  Heart murmur   . High cholesterol   . Hypertension   . Hypothyroidism   . Iron deficiency anemia    "had to get an iron infusion"  . Migraine    "used to have them growing up" (03/25/2018)  . Presence of permanent cardiac pacemaker 03/25/2018    Past Surgical History:  Procedure Laterality Date  . APPENDECTOMY    . BIOPSY  05/19/2018   Procedure: BIOPSY;  Surgeon: Ladene Artist, MD;  Location: [facility];  Service: Endoscopy;;  . Consuela Mimes W/ RETROGRADES Bilateral 01/14/2019   Procedure: CYSTOSCOPY WITH RETROGRADE PYELOGRAM BILATERAL HYDRODISTENTION;  Surgeon:  Ardis Hughs, MD;  Location: [facility];  Service: Urology;  Laterality: Bilateral;  . ESOPHAGOGASTRODUODENOSCOPY N/A 05/19/2018   Procedure: ESOPHAGOGASTRODUODENOSCOPY (EGD);  Surgeon: Ladene Artist, MD;  Location[facility] END[facility]COPY;  Service: Endoscopy;  Laterality: N/A;  . INSERT / REPLACE / REMOVE PACEMAKER  03/25/2018  . IR FLUORO GUIDE CV LINE RIGHT  05/18/2018  . IR LUMBAR DISC ASPIRATION W/IMG GUIDE  05/15/2018  . IR REMOVAL TUN CV CATH W/O FL  07/23/2018  . IR US GUIDE VASC ACCESS RIGHT  05/18/2018  . KNEE ARTHROSCOPY Bilateral   . PACEMAKER IMPLANT N/A 03/25/2018   Procedure: PACEMAKER IMPLANT;  Surgeon: Evans Lance, MD;  Location: [place] [facility];  Service: Cardiovascular;  Laterality: N/A;  . PACEMAKER PLACEMENT Left 03/2018  . RIGHT HEART CATH N/A 03/20/2018   Procedure: RIGHT HEART CATH;  Surgeon: Nigel Mormon, MD;  Location: [place] [facility];  Service: Cardiovascular;  Laterality: N/A;  . TEE WITHOUT CARDIOVERSION N/A 03/20/2018   Procedure: TRANSESOPHAGEAL ECHOCARDIOGRAM (TEE);  Surgeon: Nigel Mormon, MD;  Location: [facility] ENDOSCOPY;  Service: Cardiovascular;  Laterality: N/A;  . TONSILLECTOMY    . URETEROSCOPY WITH HOLMIUM LASER LITHOTRIPSY Right 01/14/2019   Procedure: URETEROSCOPY WITH HOLMIUM LASER LITHOTRIPSY BLADDER BIOPSIES RIGHT STENT PLACEMENT;  Surgeon: Ardis Hughs, MD;  Location: [facility];  Service: Urology;  Laterality: Right;    Prior to Admission medications   Medication Sig Start Date End Date Taking? Authorizing Provider  allopurinol (ZYLOPRIM) 100 MG tablet Take 2 tablets (200 mg total) by mouth daily. Patient taking differently: Take 50 mg by mouth daily.  05/05/18  Yes Hongalgi, Lenis Dickinson, MD  Ascorbic Acid (VITAMIN C PO) Take 1 tablet by mouth 2 (two) times daily.   Yes [provider]  Biotin 5 MG CAPS Take 1 capsule (5 mg total) by mouth daily. 01/14/19  Yes Ardis Hughs, MD  cetirizine (ZYRTEC) 10 MG tablet Take 10 mg by  mouth at bedtime.    Yes [provider]  diazepam (VALIUM) 10 MG tablet 10 mg See admin instructions. Insert 10 mg vaginally at bedtime   Yes [provider]  famotidine (PEPCID) 10 MG tablet Take 10 mg by mouth daily.   Yes [provider]  ferrous sulfate 325 (65 FE) MG tablet Take 1 tablet (325 mg total) by mouth 2 (two) times daily with a meal. 06/22/18  Yes Love, Ivan Anchors, PA-C  fesoterodine (TOVIAZ) 4 MG TB24 tablet Take 4 mg by mouth at bedtime.   Yes [provider]  fluticasone (FLONASE) 50 MCG/ACT nasal spray Place 2 sprays into both nostrils daily as needed for allergies or rhinitis.   Yes [provider]  folic acid (FOLVITE) 1 MG tablet Take 1 tablet (1 mg total) by mouth daily. 06/22/18  Yes Love, Ivan Anchors, PA-C  furosemide (LASIX) 80 MG  tablet Take 1 tablet (80 mg total) by mouth 2 (two) times daily. Patient taking differently: Take 160 mg by mouth daily.  06/22/18  Yes Love, Ivan Anchors, PA-C  levothyroxine (SYNTHROID, LEVOTHROID) 75 MCG tablet Take 75 mcg by mouth daily before breakfast.   Yes [provider]  Lidocaine 5 % CREA Apply 1 application topically as needed (for pain).   Yes [provider]  mirabegron ER (MYRBETRIQ) 50 MG TB24 tablet Take 50 mg by mouth daily.   Yes [provider]  Multiple Vitamin (MULTI-VITAMIN PO) Take 1 tablet by mouth daily.   Yes [provider]  oxycodone (OXY-IR) 5 MG capsule Take 1-2 capsules (5-10 mg total) by mouth every 4 (four) hours as needed. 01/14/19  Yes Ardis Hughs, MD  potassium chloride (K-DUR) 10 MEQ tablet Take 1 tablet (10 mEq total) by mouth 3 (three) times daily. 06/22/18  Yes Love, Ivan Anchors, PA-C  simvastatin (ZOCOR) 40 MG tablet Take 0.5 tablets (20 mg total) by mouth at bedtime. Patient taking differently: Take 40 mg by mouth at bedtime.  06/22/18  Yes Love, Ivan Anchors, PA-C  vitamin B-12 (CYANOCOBALAMIN) 1000 MCG tablet Take 1 tablet (1,000 mcg  total) by mouth daily. 06/22/18  Yes Love, Ivan Anchors, PA-C  Insulin Glargine (LANTUS) 100 UNIT/ML Solostar Pen Inject 20 Units into the skin daily. Patient not taking: Reported on 01/13/2019 06/22/18   Love, Ivan Anchors, PA-C  Insulin Pen Needle 31G X 6 MM MISC 1 application by Does not apply route daily. Patient not taking: Reported on 01/04/2019 06/22/18   Bary Leriche, PA-C    Current Facility-Administered Medications  Medication Dose Route Frequency Provider Last Rate Last Dose  . acetaminophen (TYLENOL) tablet 650 mg  650 mg Oral Q6H PRN Phillips Grout, MD   650 mg at 01/23/19 2227   Or  . acetaminophen (TYLENOL) suppository 650 mg  650 mg Rectal Q6H PRN Phillips Grout, MD      . famotidine (PEPCID) tablet 10 mg  10 mg Oral Daily Phillips Grout, MD   10 mg at 01/25/19 0809  . ferrous sulfate tablet 325 mg  325 mg Oral BID WC Phillips Grout, MD   325 mg at 01/25/19 2951  . fesoterodine (TOVIAZ) tablet 4 mg  4 mg Oral QHS Derrill Kay A, MD   4 mg at 01/24/19 2154  . folic acid (FOLVITE) tablet 1 mg  1 mg Oral Daily Derrill Kay A, MD   1 mg at 01/25/19 8841  . levothyroxine (SYNTHROID, LEVOTHROID) tablet 75 mcg  75 mcg Oral QAC breakfast Phillips Grout, MD   75 mcg at 01/25/19 0537  . lidocaine (XYLOCAINE) 5 % ointment 1 application  1 application Topical PRN Derrill Kay A, MD      . loratadine (CLARITIN) tablet 10 mg  10 mg Oral Daily Modena Jansky, MD   10 mg at 01/25/19 0809  . mirabegron ER (MYRBETRIQ) tablet 50 mg  50 mg Oral Daily Derrill Kay A, MD   50 mg at 01/25/19 6606  . polyethylene glycol (MIRALAX / GLYCOLAX) packet 17 g  17 g Oral Daily Modena Jansky, MD   17 g at 01/22/19 1720  . senna (SENOKOT) tablet 8.6 mg  1 tablet Oral Daily Modena Jansky, MD   8.6 mg at 01/25/19 0809  . tamsulosin (FLOMAX) capsule 0.4 mg  0.4 mg Oral Daily Modena Jansky, MD   0.4 mg at 01/25/19 3016  .  URELLE (URELLE/URISED) 81 MG tablet 81 mg  1 tablet Oral Q6H PRN Schorr, Rhetta Mura, NP   81 mg at 01/22/19 2219  . vitamin B-12 (CYANOCOBALAMIN) tablet 1,000 mcg  1,000 mcg Oral Daily Derrill Kay A, MD   1,000 mcg at 01/25/19 9892    Allergies as of 01/16/2019 - Review Complete 01/16/2019  Allergen Reaction Noted  . Baclofen Other (See Comments) 06/17/2018  . Codeine Other (See Comments) 06/26/2017    Family History  Problem Relation Age of Onset  . Hypertension Mother   . Diabetes Mellitus II Father   . Heart disease Father   . Gastric cancer Brother   . Diabetes Mellitus II Brother     Social History   Socioeconomic History  . Marital status: Widowed    Spouse name: Not on file  . Number of children: Not on file  . Years of education: Not on file  . Highest education level: Not on file  Occupational History  . Not on file  Social Needs  . Financial resource strain: Not on file  . Food insecurity:    Worry: Not on file    Inability: Not on file  . Transportation needs:    Medical: Not on file    Non-medical: Not on file  Tobacco Use  . Smoking status: Never Smoker  . Smokeless tobacco: Never Used  Substance and Sexual Activity  . Alcohol use: Not Currently  . Drug use: Never  . Sexual activity: Not Currently  Lifestyle  . Physical activity:    Days per week: Not on file    Minutes per session: Not on file  . Stress: Not on file  Relationships  . Social connections:    Talks on phone: Not on file    Gets together: Not on file    Attends religious service: Not on file    Active member of club or organization: Not on file    Attends meetings of clubs or organizations: Not on file    Relationship status: Not on file  . Intimate partner violence:    Fear of current or ex partner: Not on file    Emotionally abused: Not on file    Physically abused: Not on file    Forced sexual activity: Not on file  Other Topics Concern  . Not on file  Social History Narrative  . Not on file    Review of Systems: All systems reviewed and negative  except where noted in HPI.  Physical Exam: Vital signs in last 24 hours: Temp:  [97.9 F (36.6 C)-99 F (37.2 C)] 97.9 F (36.6 C) (02/24 0923) Pulse Rate:  [83-87] 83 (02/24 0923) Resp:  [18-22] 22 (02/24 0923) BP: (112-155)/(62-82) 113/62 (02/24 0923) SpO2:  [99 %-100 %] 99 % (02/24 0923) Last BM Date: 01/25/19 General:   Alert, well-developed,  female in NAD Psych:  Pleasant, cooperative. Normal mood and affect. Eyes:  Pupils equal, sclera clear, no icterus.   Conjunctiva pink. Ears:  Normal auditory acuity. Nose:  No deformity, discharge,  or lesions. Neck:  Supple; no masses Lungs:  Clear throughout to auscultation.   No wheezes, crackles, or rhonchi.  Heart:  Regular rate and rhythm; loud murmurs, no lower extremity edema Abdomen:  Soft, non-distended, nontender, BS active, no palp mass    Rectal:  Deferred  Msk:  Symmetrical without gross deformities. . Neurologic:  Alert and  oriented x4;  grossly normal neurologically. Skin:  Intact without significant lesions or rashes.  Intake/Output from previous day: 02/23 0701 - 02/24 0700 In: 7 [P.O.:820] Out: 400 [Urine:400] Intake/Output this shift: Total I/O In: 120 [P.O.:120] Out: -   Lab Results: Recent Labs    01/24/19 1225 01/25/19 0522 01/25/19 0802  WBC 9.1 10.5 11.4*  HGB 7.7* 6.7* 7.3*  HCT 24.3* 21.0* 23.8*  PLT 278 299 332   BMET Recent Labs    01/23/19 0836 01/24/19 0332 01/25/19 0522  NA 132* 133* 132*  K 3.8 3.4* 3.6  CL 100 103 103  CO2 21* 18* 20*  GLUCOSE 107* 122* 138*  BUN 41* 36* 30*  CREATININE 2.85* 2.80* 2.54*  CALCIUM 9.1 9.0 9.0     Studies/Results: No results found.   Tye Savoy, NP-C @  01/25/2019, 10:42 AM

## 2019-01-25 NOTE — Progress Notes (Signed)
[facility]    Date of Admission:  01/16/2019   Total days of antibiotics 3 ( off of abxt x 7days)           ID: Erika Fry is a 73 y.o. female with FUO, had hx of complicated uti, urinary urgency and remote hx of discitis Principal Problem:   Fever Active Problems:   Aortic stenosis   CKD (chronic kidney disease), stage IV ([facility])   Anemia of chronic disease   Benign essential HTN   Acute lower UTI   Pressure injury of skin    Subjective: Remains afebrile since 2/21, feeling better but noticing dark, tarry stools x 2 today  Medications:  . ferrous sulfate  325 mg Oral BID WC  . fesoterodine  4 mg Oral QHS  . folic acid  1 mg Oral Daily  . levothyroxine  75 mcg Oral QAC breakfast  . loratadine  10 mg Oral Daily  . mirabegron ER  50 mg Oral Daily  . pantoprazole  40 mg Oral BID  . polyethylene glycol  17 g Oral Daily  . senna  1 tablet Oral Daily  . tamsulosin  0.4 mg Oral Daily  . vitamin B-12  1,000 mcg Oral Daily    Objective: Vital signs in last 24 hours: Temp:  [97.9 F (36.6 C)-99 F (37.2 C)] 98.1 F (36.7 C) (02/24 1626) Pulse Rate:  [81-87] 81 (02/24 1626) Resp:  [18-22] 20 (02/24 1626) BP: (112-155)/(62-82) 145/73 (02/24 1626) SpO2:  [99 %-100 %] 100 % (02/24 1626) Physical Exam  Constitutional:  oriented to person, place, and time. appears well-developed and well-nourished. No distress.  HENT: /AT, PERRLA, no scleral icterus Mouth/Throat: Oropharynx is clear and moist. No oropharyngeal exudate.  Cardiovascular: Normal rate, regular rhythm and normal heart sounds. Exam reveals no gallop and no friction rub.  No murmur heard.  Pulmonary/Chest: Effort normal and breath sounds normal. No respiratory distress.  has no wheezes.  Neck = supple, no nuchal rigidity Abdominal: Soft. Bowel sounds are normal.  exhibits no distension. There is no tenderness.  Lymphadenopathy: no cervical adenopathy. No axillary  adenopathy Neurological: alert and oriented to person, place, and time.  Skin: Skin is warm and dry. No rash noted. No erythema.  Psychiatric: a normal mood and affect.  behavior is normal.   Lab Results Recent Labs    01/24/19 0332  01/25/19 0522 01/25/19 0802  WBC  --    < > 10.5 11.4*  HGB  --    < > 6.7* 7.3*  HCT  --    < > 21.0* 23.8*  NA 133*  --  132*  --   K 3.4*  --  3.6  --   CL 103  --  103  --   CO2 18*  --  20*  --   BUN 36*  --  30*  --   CREATININE 2.80*  --  2.54*  --    < > = values in this interval not displayed.    Microbiology: reviewed Studies/Results: No results found.   Assessment/Plan: fuo = now resolved. Suspect that initial fevers maybe related to urologic instrumentation/complicated urinary infection for which she had been on fosfomycin prior to admit. Discitis ruled out. No other sources found.  ckd 4 = stable  Heme positive stool = gi weighing to whether she may need EGD during this hospitalization, could account for leukocytosis.  Will sign off  [facility]  for Infectious Diseases Cell: [phone] Pager: [phone]  01/25/2019, 5:21 PM

## 2019-01-25 NOTE — Progress Notes (Signed)
CRITICAL LAB VALUE HEMOGLOBIN: 6.7  Hongalgi, MD notified. Awaiting orders.

## 2019-01-26 LAB — CBC
HCT: 20.3 % — ABNORMAL LOW (ref 36.0–46.0)
Hemoglobin: 6.4 g/dL — CL (ref 12.0–15.0)
MCH: 29.8 pg (ref 26.0–34.0)
MCHC: 31.5 g/dL (ref 30.0–36.0)
MCV: 94.4 fL (ref 80.0–100.0)
Platelets: 297 10*3/uL (ref 150–400)
RBC: 2.15 MIL/uL — ABNORMAL LOW (ref 3.87–5.11)
RDW: 13.9 % (ref 11.5–15.5)
WBC: 9 10*3/uL (ref 4.0–10.5)
nRBC: 0 % (ref 0.0–0.2)

## 2019-01-26 LAB — BASIC METABOLIC PANEL WITH GFR
Anion gap: 9 (ref 5–15)
BUN: 29 mg/dL — ABNORMAL HIGH (ref 8–23)
CO2: 22 mmol/L (ref 22–32)
Calcium: 9.1 mg/dL (ref 8.9–10.3)
Chloride: 103 mmol/L (ref 98–111)
Creatinine, Ser: 2.65 mg/dL — ABNORMAL HIGH (ref 0.44–1.00)
GFR calc Af Amer: 20 mL/min — ABNORMAL LOW
GFR calc non Af Amer: 17 mL/min — ABNORMAL LOW
Glucose, Bld: 151 mg/dL — ABNORMAL HIGH (ref 70–99)
Potassium: 3.7 mmol/L (ref 3.5–5.1)
Sodium: 134 mmol/L — ABNORMAL LOW (ref 135–145)

## 2019-01-26 LAB — GLUCOSE, CAPILLARY
Glucose-Capillary: 130 mg/dL — ABNORMAL HIGH (ref 70–99)
Glucose-Capillary: 124 mg/dL — ABNORMAL HIGH (ref 70–99)
Glucose-Capillary: 118 mg/dL — ABNORMAL HIGH (ref 70–99)
Glucose-Capillary: 133 mg/dL — ABNORMAL HIGH (ref 70–99)

## 2019-01-26 LAB — PREPARE RBC (CROSSMATCH)

## 2019-01-26 MED ORDER — SODIUM CHLORIDE 0.9% IV SOLUTION
Freq: Once | INTRAVENOUS | Status: AC
  Administered 2019-01-28: 08:00:00 via INTRAVENOUS

## 2019-01-26 NOTE — Care Management Important Message (Signed)
Important Message  Patient Details  Name: Erika Fry MRN: [id number] Date of Birth: 04/29/1946   Medicare Important Message Given:  Yes    [name] 01/26/2019, 3:54 PM

## 2019-01-26 NOTE — Progress Notes (Addendum)
[facility] Progress Note   Chief Complaint:   Anemia, dark, heme positive stool    SUBJECTIVE:    reports FORMED black stool this am. No abdominal pain. She is having dysuria. On bladder analgesics.    ASSESSMENT AND PLAN:   33.  73 year old female with mild progression of chronic normocytic anemia and dark, heme positive stool.  Baseline hemoglobin over the last several months has been in the 8-10 range but now fluctuating between mid 6 and mid 7. Stools had not been dark at home (despite iron) but now they are and reports a black stool this am.   -Similar scenario in June 2019 at which time a 10 mm pedunculated friable polyp (hyperplastic) was found in the pylorus.  Due to health issues she hasn't returned to our office for further anemia workup   -Polyp may be oozing again. Plan is for EGD tomorrow with possible polyp resection. Doubt she is physically able to prep for a colonoscopy this admission.  -Hgb back down to 6.4 today as it was a couple of days ago. Not sure why the fluctuation but still below baseline. Hospitalist to transfuse a unit of PRBC. -continue PPI -Patient reports post-op anesthesia problems following recent cysto. Per Anesthesia's note patient was very sleepy in PACU. Hypoxic on RA, resolved with waking and supplemental 02.   2. Recent FUO, resolved and felt to be secondary to recent Urologic instrumentation.   3. Ongoing dysuria. Getting bladder analgesics but still burning. Had cysto / stent placed on 2/13. Urine culture negative. I thought about pyridium, talked with Hospitalist. She apparently can't take pyridium due to renal function.   4. CKD4  OBJECTIVE:     Vital signs in last 24 hours: Temp:  [97.9 F (36.6 C)-98.1 F (36.7 C)] 98.1 F (36.7 C) (02/25 0852) Pulse Rate:  [79-87] 80 (02/25 0852) Resp:  [18-22] 22 (02/25 0852) BP: (120-145)/(68-75) 139/75 (02/25 0852) SpO2:  [97 %-100 %] 100 % (02/25 0852) Last BM Date:  01/25/19 General:   Alert, well-developed female in NAD EENT:  Normal hearing, non icteric sclera, conjunctive pink.  Heart:  Regular rate and rhythm; no murmur.  No lower extremity edema   Pulm: Normal respiratory effort, lungs CTA bilaterally without wheezes or crackles. Abdomen:  Soft, nondistended, nontender.  Normal bowel sounds, no masses felt.       Neurologic:  Alert and  oriented x4;  grossly normal neurologically. Psych:  Pleasant, cooperative.  Normal mood and affect.   Intake/Output from previous day: 02/24 0701 - 02/25 0700 In: 480 [P.O.:480] Out: 475 [Urine:475] Intake/Output this shift: Total I/O In: -  Out: 100 [Urine:100]  Lab Results: Recent Labs    01/25/19 0802 01/25/19 2002 01/26/19 0621  WBC 11.4* 10.3 9.0  HGB 7.3* 7.6* 6.4*  HCT 23.8* 24.5* 20.3*  PLT 332 302 297   BMET Recent Labs    01/24/19 0332 01/25/19 0522 01/26/19 0621  NA 133* 132* 134*  K 3.4* 3.6 3.7  CL 103 103 103  CO2 18* 20* 22  GLUCOSE 122* 138* 151*  BUN 36* 30* 29*  CREATININE 2.80* 2.54* 2.65*  CALCIUM 9.0 9.0 9.1     Principal Problem:   Fever Active Problems:   Aortic stenosis   CKD (chronic kidney disease), stage IV ([facility])   Anemia of chronic disease   Benign essential HTN   Acute lower UTI   Pressure injury of skin     LOS: 9 days  Tye Savoy ,NP 01/26/2019, 10:01 AM

## 2019-01-26 NOTE — Progress Notes (Signed)
Occupational Therapy Treatment Patient Details Name: Erika Fry MRN: [id number] DOB: 09-Aug-1946 Today's Date: 01/26/2019    History of present illness Pt admitted with UTI and fever; recent d/c from urology. PMH of lumbar spine osteomyelitis/discitis, CKD, HTN, DM, pacemaker.   OT comments  Pt making good progress with functional goals since last session. Pt sate EOB with min guard A (no physical assist) for simulated bathing tasks. Functional mobility using RW to bathroom for toilet transfers min A, toileting and stood at sink for hygiene/grooming min guard A. Pt reports that she is feeling better and would like to go to CIR vs SNF after acute d/c. Pt smiling and talkative. OT will continue to follow.  Follow Up Recommendations  SNF    Equipment Recommendations  Other (comment)(TBD at next venue of care)    Recommendations for Other Services      Precautions / Restrictions Precautions Precautions: Fall Restrictions Weight Bearing Restrictions: No       Mobility Bed Mobility   Bed Mobility: Supine to Sit Rolling: Supervision   Supine to sit: Min guard Sit to supine: Min assist   General bed mobility comments: min A with LE s back onto bed  Transfers Overall transfer level: Needs assistance Equipment used: Rolling walker (2 wheeled) Transfers: Sit to/from Stand Sit to Stand: Min assist              Balance Overall balance assessment: Needs assistance Sitting-balance support: Bilateral upper extremity supported;Feet supported Sitting balance-Leahy Scale: Fair     Standing balance support: Bilateral upper extremity supported;During functional activity;Single extremity supported Standing balance-Leahy Scale: Poor                             ADL either performed or assessed with clinical judgement   ADL Overall ADL's : Needs assistance/impaired     Grooming: Wash/dry hands;Wash/dry face;Min guard;Standing   Upper Body Bathing: Set  up;Supervision/ safety Upper Body Bathing Details (indicate cue type and reason): simulated Lower Body Bathing: Moderate assistance;Sitting/lateral leans Lower Body Bathing Details (indicate cue type and reason): simulated Upper Body Dressing : Set up;Supervision/safety;Sitting       Toilet Transfer: Minimal assistance;Ambulation;RW;Regular Toilet;Grab bars   Toileting- Clothing Manipulation and Hygiene: Minimal assistance;Sit to/from stand       Functional mobility during ADLs: Minimal assistance;Rolling walker       Vision Baseline Vision/History: Wears glasses Wears Glasses: At all times Patient Visual Report: No change from baseline     Perception     Praxis      Cognition Arousal/Alertness: Awake/alert Behavior During Therapy: WFL for tasks assessed/performed Overall Cognitive Status: Within Functional Limits for tasks assessed                                          Exercises     Shoulder Instructions       General Comments      Pertinent Vitals/ Pain       Pain Assessment: No/denies pain Pain Score: 0-No pain Pain Intervention(s): Monitored during session  Home Living                                          Prior Functioning/Environment  Frequency  Min 2X/week        Progress Toward Goals  OT Goals(current goals can now be found in the care plan section)  Progress towards OT goals: Progressing toward goals  Acute Rehab OT Goals Patient Stated Goal: CIR instead of SNF  Plan Discharge plan remains appropriate    Co-evaluation                 AM-PAC OT "6 Clicks" Daily Activity     Outcome Measure   Help from another person eating meals?: None Help from another person taking care of personal grooming?: A Little Help from another person toileting, which includes using toliet, bedpan, or urinal?: A Lot Help from another person bathing (including washing, rinsing, drying)?: A  Lot Help from another person to put on and taking off regular upper body clothing?: A Little Help from another person to put on and taking off regular lower body clothing?: Total 6 Click Score: 15    End of Session Equipment Utilized During Treatment: Gait belt;Oxygen;Rolling walker  OT Visit Diagnosis: Muscle weakness (generalized) (M62.81);Pain;Other abnormalities of gait and mobility (R26.89)   Activity Tolerance Patient limited by fatigue   Patient Left in bed;with call bell/phone within reach;with family/visitor present   Nurse Communication          Time: 7408-1448 OT Time Calculation (min): 26 min  Charges: OT General Charges $OT Visit: 1 Visit OT Treatments $Self Care/Home Management : 8-22 mins $Therapeutic Activity: 8-22 mins    Journiee, Feldkamp 01/26/2019, 1:52 PM

## 2019-01-26 NOTE — Progress Notes (Signed)
PROGRESS NOTE  Erika Fry  TUU:[id number] DOB: 09/14/1946 DOA: 01/16/2019 PCP: [name], MD   Brief Narrative: 73 y.o.femalewith medical history significantfor stage IV CKD, hypertension, diabetes, L4-L5 discitis, with recent discharge from urology service who presented for poor per oral intake, pelvic pain, lethargy. Work up demonstrated BUN of 99, and creatinine of 6, with pyuria on UA and dysuria. Ceftriaxone was started and nephrology was consulted.  Acute kidney injury resolved.  Ongoing chronic pelvic pain.  ID consulted for FUO, extensive work-up thus far nonrevealing, fevers have resolved and ID signed off.  Hospital course complicated by progressive anemia, suspected slow GI bleed/FOBT +, transfusing 1 RBC today and GI plans EGD 2/26.  Assessment & Plan: Principal Problem:   Fever Active Problems:   Aortic stenosis   CKD (chronic kidney disease), stage IV (HCC)   Anemia of chronic disease   Benign essential HTN   Acute lower UTI   Pressure injury of skin  Fever:  -On admission due to UTI symptoms and positive UA, treated with ceftriaxone, urine cultures however were negative and completed 3 days course of ceftriaxone 2/15- 2/17.  -Due to ongoing fevers, ID was consulted and patient underwent extensive evaluation. -CRP 20, ESR 98, CT abdomen and pelvis without contrast 2/18: L4-5 discitis/osteomyelitis similar to prior lumbar MRI, probable gallstones without cholecystitis, right ureteral stent with mild right hydronephrosis and hydroureter. -Orthopedics performed left knee diagnostic arthrocentesis: Synovial fluid showed no crystals, WBCs 255, 61% neutrophils, rest lymphocytes and monocytes.  Synovial fluid culture negative, final. - MRI left knee 2/20: No discrete evidence of soft tissue infection about the knee, medial meniscus tear and tricompartmental osteoarthritis.  Left knee pain has improved after arthrocentesis and patient was able to ambulate in the room  with the help of walker and PT supervision on 2/21. - Repeated blood cultures 2/18 x 2: Negative, final report. - Lumbar spine MRI shows resolution of L4-5 discitis and osteomyelitis. - Had fever of 102.9 on night of 2/20 and has been afebrile since.  Monitoring off of antibiotics.  Fevers resolved.  ID signoff note 2/24 appreciated, initial fevers may be related to urological instrumentation/complicated UTI for which she had been on fosfomycin prior to admission.  Left knee pain Unclear etiology.  Management as noted above.  Acute pain seems to have resolved.  Ambulating better.  AKI on stage IV CKD:  As per nephrology sign off 2/19, patient's recent baseline creatinine was progressing and was 5.3 prior to urological procedures.  She follows with Dr. Johnney Ou, Nephrology. -Acute kidney injury was felt due to dehydration and obstructive etiology/hydronephrosis. -Post IV fluid hydration and right ureteral stent placement and lithotripsy on 01/14/2019, creatinine has steadily improved.  Continue to follow BMP. -Outpatient follow-up with Dr. Johnney Ou, Nephrology will arrange. -Creatinine has fluctuated in the 2.5-2.8 for the last few days and probably new baseline and stable.  Continue to follow BMP closely.  Foley catheter discontinued 2/20.  Off of IV fluids as well.  Acute kidney injury resolved.  NAGMA: Changed to LR -Bicarbonate hovering in the 18-20 range for the last few days.  Bicarbonate continues to fluctuate.  Discontinue LR.  Bicarbonate up to 22 today.  Acute metabolic encephalopathy: Possibly due to uremia vs. FUO.  -Resolved.  Anemia of chronic disease:  -Hemoglobin had been stable in the 7.5 to 8 g range for several days.  "Dark stools" were then reported by patient after several days history of constipation.  Due to stable hemoglobin and being on iron  tablets, her dark stools were initially attributed to iron supplements and were considering elective colonoscopy as  outpatient. -Subsequently however her hemoglobin gradually drifted down to 6.4 on 2/25.  There is concern for slow GI bleed (see below). -Transfusing 1 unit PRBC on 2/25.  Monitor CBCs closely and aim to keep >7 g per DL.  Benign essential HTN:  - Continue home medications.  Mildly controlled at times.  Stage I pressure injury bilateral buttocks. Not known POA status.  - Offload as able.  Obesity/Body mass index is 32.57 kg/m. - Weight loss long term recommended.  Chronic pelvic pain - Status post cystoscopy, hydrodistention, right ureteral stent placement and lithotripsy by Dr. Louis Meckel, Urology on 01/14/2018.   - Had ongoing severe PV pain on 2/20 and patient stated "I want the stent out".  I discussed with Dr. Louis Meckel on 2/20, discontinued Foley catheter with improvement in vaginal pain, then having persistent dysuria-longstanding since before Thanksgiving, continued Myrbetriq, [place], treated constipation, discussed again with urology on 2/23, suspected possible stent related discomfort and recommended trial of tamsulosin but otherwise not many options.  Unable to use Pyridium due to chronic kidney disease. - Would prefer not to DC ureteral stent due to recent acute kidney injury and obstruction. - Outpatient follow-up with Dr. Louis Meckel  L4-5 discitis/osteomyelitis Has completed course of IV followed by oral antibiotics.  Lumbar spine MRI shows resolution.  DM2 A1c 6.3 on 04/30/2018.  Does not appear that she was on medications PTA/possibly diet controlled.  CBGs show good inpatient control.  GERD Due to concern for possible slow GI bleed, just [facility] has changed Pepcid to PPI.  Hypothyroid Continue Synthroid.  S/p PPM.  Hypokalemia: Replaced.  "Dark stools"/FOBT + Following bowel regimen , patient having "dark stools". Suspected initially due to iron supplements.  FOBT positive but hemoglobin was stable.  Had EGD June 2019 when she had a friable polyp removed  but was unable to follow-up for colonoscopy. Then patient started to have gradually dropping hemoglobin and multiple loose BMs.  [facility] consulted. Concerned that she may be having recurrent oozing from prior gastric polyp area and plan EGD 2/26. and do not think that she can tolerate colonoscopy at this time. As per GI, patient stools had not been dark at home despite iron supplements but now are dark.   DVT prophylaxis: Discontinued subcutaneous heparin and started SCDs. Code Status: Full Family Communication: None at bedside today. Disposition Plan: DC to CIR pending clinical improvement, completion of work-up and insurance approval.  Patient states that CIR is willing to take her and are working with her insurance.  Consultants:   Nephrology  Urology  Orthopedics  [facility]  Procedures:   Foley catheter-discontinued 2/20  Antimicrobials:  Ceftriaxone-discontinued  Pathology: 01/14/2019: Diagnosis 1. Bladder, biopsy, right lateral wall BENIGN UROTHELIUM AND SUBMUCOSA WITH PROMINENT CHRONIC INFLAMMATION, TELANGIECTASIA AND REACTIVE CHANGES 2. Bladder, biopsy, posterior wall MOSTLY DENUDED SUBMUCOSA WITH PROMINENT INFLAMMATION, HEMORRHAGE AND REACTIVE CHANGES 3. Bladder, biopsy, left lateral wall FIBRIN AND INFLAMMATORY CELLS 4. Bladder, biopsy, middle trigone BENIGN UROTHELIUM AND SUBMUCOSA WITH PROMINENT FOLLICULAR CYSTITIS AND REACTIVE CHANGES  Cytology 01/14/2019:  Diagnosis URINE CYTOLOGY, CATHETERIZED, RIGHT UPPER TRACT(SPECIMEN 1 OF 1 COLLECTED 01/14/19): NEGATIVE FOR HIGH GRADE UROTHELIAL CARCINOMA. ACUTE INFLAMMATION.  Subjective: States that she had multiple loose dark BMs yesterday and one episode this morning.  No red blood.  Stool not watery or hard.  Ongoing dysuria.  After discussing risks versus benefits, agreeable to blood transfusion.  Objective:  Vitals:   01/26/19 0409 01/26/19 0852 01/26/19 1458 01/26/19 1528  BP: 134/69 139/75 129/74  137/68  Pulse: 79 80 90 83  Resp: 18 (!) 22 19 16   Temp: 97.9 F (36.6 C) 98.1 F (36.7 C) 97.6 F (36.4 C) 97.7 F (36.5 C)  TempSrc: Oral Oral Oral Oral  SpO2: 100% 100% 100% 100%  Weight:      Height:        Intake/Output Summary (Last 24 hours) at 01/26/2019 1541 Last data filed at 01/26/2019 1513 Gross per 24 hour  Intake 560 ml  Output 875 ml  Net -315 ml   Filed Weights   01/20/19 2133 01/22/19 1944 01/23/19 2145  Weight: 83.8 kg 83.9 kg 88.8 kg   Gen: Pleasant elderly female, moderately built and obese lying comfortably propped up in bed. Pulm: Clear to auscultation.  No increased work of breathing.  Stable CV: S1 and S2 heard, RRR.  No JVD, murmurs or pedal edema.  Stable GI: Abdomen is nondistended, soft, nontender.  No organomegaly or masses appreciated.  Normal bowel sounds heard.  No change and stable PV: As examined on 2/22 along with female NT as chaperone in the room.  Mild dried dark green stools inferior labial folds.  No other acute findings to explain dysuria. Ext: No lower extremity edema.  Left knee without swelling, redness, warmth or tenderness.  No acute findings. Skin: No new rashes, lesions or ulcers on visualized skin. Neuro: Alert and oriented.  No focal neurological deficits.  Stable. Psych: Judgement and insight appear fair. Mood euthymic & affect congruent. Behavior is appropriate.    Data Reviewed: I have personally reviewed following labs and imaging studies  CBC: Recent Labs  Lab 01/24/19 1225 01/25/19 0522 01/25/19 0802 01/25/19 2002 01/26/19 0621  WBC 9.1 10.5 11.4* 10.3 9.0  HGB 7.7* 6.7* 7.3* 7.6* 6.4*  HCT 24.3* 21.0* 23.8* 24.5* 20.3*  MCV 93.8 91.7 92.6 93.5 94.4  PLT 278 299 332 302 446   Basic Metabolic Panel: Recent Labs  Lab 01/22/19 0634 01/23/19 0836 01/24/19 0332 01/25/19 0522 01/26/19 0621  NA 135 132* 133* 132* 134*  K 4.0 3.8 3.4* 3.6 3.7  CL 103 100 103 103 103  CO2 18* 21* 18* 20* 22  GLUCOSE 145* 107*  122* 138* 151*  BUN 43* 41* 36* 30* 29*  CREATININE 2.96* 2.85* 2.80* 2.54* 2.65*  CALCIUM 9.1 9.1 9.0 9.0 9.1   GFR: Estimated Creatinine Clearance: 20.5 mL/min (A) (by C-G formula based on SCr of 2.65 mg/dL (H)). Liver Function Tests: No results for input(s): AST, ALT, ALKPHOS, BILITOT, PROT, ALBUMIN in the last 168 hours. No results for input(s): LIPASE, AMYLASE in the last 168 hours. CBG: Recent Labs  Lab 01/25/19 1119 01/25/19 1628 01/25/19 2228 01/26/19 0729 01/26/19 1124  GLUCAP 140* 146* 145* 130* 124*   Urine analysis:    Component Value Date/Time   COLORURINE YELLOW 01/20/2019 0649   APPEARANCEUR TURBID (A) 01/20/2019 0649   LABSPEC 1.008 01/20/2019 0649   PHURINE 5.0 01/20/2019 0649   GLUCOSEU NEGATIVE 01/20/2019 0649   HGBUR MODERATE (A) 01/20/2019 0649   BILIRUBINUR NEGATIVE 01/20/2019 0649   KETONESUR 5 (A) 01/20/2019 0649   PROTEINUR 100 (A) 01/20/2019 0649   NITRITE NEGATIVE 01/20/2019 0649   LEUKOCYTESUR LARGE (A) 01/20/2019 0649   Recent Results (from the past 240 hour(s))  Urine culture     Status: None   Collection Time: 01/16/19  9:51 PM  Result Value Ref Range Status  Specimen Description   Final    URINE, RANDOM Performed at [facility], [place] [address] [place], [place] [address]    Special Requests   Final    NONE Performed at [facility], [place] [address] [place], [place] [address]    Culture   Final    NO GROWTH Performed at [facility], [place] [address] [place], [place] [address]    Report Status 01/18/2019 FINAL  Final  Culture, blood (routine x 2)     Status: None   Collection Time: 01/16/19 10:41 PM  Result Value Ref Range Status   Specimen Description BLOOD LEFT ANTECUBITAL  Final   Special Requests   Final    BOTTLES DRAWN AEROBIC AND ANAEROBIC Blood Culture adequate volume Performed at [place] [address], [place], [place] [address]    Culture NO  GROWTH 5 DAYS  Final   Report Status 01/22/2019 FINAL  Final  Respiratory Panel by PCR     Status: None   Collection Time: 01/17/19 12:23 AM  Result Value Ref Range Status   Adenovirus NOT DETECTED NOT DETECTED Final   Coronavirus 229E NOT DETECTED NOT DETECTED Final    Comment: (NOTE) The Coronavirus on the Respiratory Panel, DOES NOT test for the novel  Coronavirus (2019 nCoV)    Coronavirus HKU1 NOT DETECTED NOT DETECTED Final   Coronavirus NL63 NOT DETECTED NOT DETECTED Final   Coronavirus OC43 NOT DETECTED NOT DETECTED Final   Metapneumovirus NOT DETECTED NOT DETECTED Final   Rhinovirus / Enterovirus NOT DETECTED NOT DETECTED Final   Influenza A NOT DETECTED NOT DETECTED Final   Influenza B NOT DETECTED NOT DETECTED Final   Parainfluenza Virus 1 NOT DETECTED NOT DETECTED Final   Parainfluenza Virus 2 NOT DETECTED NOT DETECTED Final   Parainfluenza Virus 3 NOT DETECTED NOT DETECTED Final   Parainfluenza Virus 4 NOT DETECTED NOT DETECTED Final   Respiratory Syncytial Virus NOT DETECTED NOT DETECTED Final   Bordetella pertussis NOT DETECTED NOT DETECTED Final   Chlamydophila pneumoniae NOT DETECTED NOT DETECTED Final   Mycoplasma pneumoniae NOT DETECTED NOT DETECTED Final    Comment: Performed at [facility], [place] [address], [place], [place] [address]  Culture, blood (Routine X 2) w Reflex to ID Panel     Status: None   Collection Time: 01/19/19 10:50 PM  Result Value Ref Range Status   Specimen Description BLOOD RIGHT HAND  Final   Special Requests   Final    BOTTLES DRAWN AEROBIC ONLY Blood Culture adequate volume   Culture   Final    NO GROWTH 5 DAYS Performed at [facility], [place] [address], [place], [place] [address]    Report Status 01/25/2019 FINAL  Final  Culture, blood (Routine X 2) w Reflex to ID Panel     Status: None   Collection Time: 01/19/19 10:50 PM  Result Value Ref Range Status   Specimen Description BLOOD RIGHT ANTECUBITAL  Final   Special  Requests   Final    BOTTLES DRAWN AEROBIC AND ANAEROBIC Blood Culture adequate volume   Culture   Final    NO GROWTH 5 DAYS Performed at [facility], [place] [address] [place],  [address]    Report Status 01/25/2019 FINAL  Final  Body fluid culture     Status: None   Collection Time: 01/20/19 10:35 AM  Result Value Ref Range Status   Specimen Description  SYNOVIAL LEFT KNEE  Final   Special Requests NONE  Final   Gram Stain   Final    RARE WBC PRESENT, PREDOMINANTLY PMN NO ORGANISMS SEEN Performed at [facility], [place] [address], [place], [place] [address]    Culture NO GROWTH  Final   Report Status 01/23/2019 FINAL  Final      Radiology Studies: No results found.  Scheduled Meds: . sodium chloride   Intravenous Once  . ferrous sulfate  325 mg Oral BID WC  . fesoterodine  4 mg Oral QHS  . folic acid  1 mg Oral Daily  . levothyroxine  75 mcg Oral QAC breakfast  . loratadine  10 mg Oral Daily  . mirabegron ER  50 mg Oral Daily  . pantoprazole  40 mg Oral BID  . senna  1 tablet Oral Daily  . tamsulosin  0.4 mg Oral Daily  . vitamin B-12  1,000 mcg Oral Daily   Continuous Infusions:    LOS: 9 days   Vernell Leep, MD, FACP, [facility]. [facility]  To contact the attending provider between 7A-7P or the covering provider during after hours 7P-7A, please log into the web site [email or web address] and access using universal [place] password for that web site. If you do not have the password, please call the hospital operator.

## 2019-01-26 NOTE — Progress Notes (Signed)
Inpatient Rehabilitation Admissions Coordinator  I met with patient at bedside to explain that her [facility] has given an initial denial for CIR admit. They do offer acute MD peer to peer with the Medical director for insurance company. Patient is requesting acute MD to do the peer to peer. We have until 3 pm 2/26 to complete the peer to peer. I will contact MD in the morning to request. Number to call to request peer to peer is [phone] to call with MD cell number and contact information. I will pursue this in the am.  Danne Baxter, RN, MSN Rehab Admissions Coordinator [phone] 01/26/2019 6:42 PM

## 2019-01-27 DIAGNOSIS — K317 Polyp of stomach and duodenum: Secondary | ICD-10-CM

## 2019-01-27 LAB — CBC
HCT: 26.1 % — ABNORMAL LOW (ref 36.0–46.0)
Hemoglobin: 8.3 g/dL — ABNORMAL LOW (ref 12.0–15.0)
MCH: 29.1 pg (ref 26.0–34.0)
MCHC: 31.8 g/dL (ref 30.0–36.0)
MCV: 91.6 fL (ref 80.0–100.0)
Platelets: 329 10*3/uL (ref 150–400)
RBC: 2.85 MIL/uL — AB (ref 3.87–5.11)
RDW: 14.8 % (ref 11.5–15.5)
WBC: 12.6 10*3/uL — ABNORMAL HIGH (ref 4.0–10.5)
nRBC: 0 % (ref 0.0–0.2)

## 2019-01-27 LAB — BASIC METABOLIC PANEL
Anion gap: 10 (ref 5–15)
BUN: 27 mg/dL — ABNORMAL HIGH (ref 8–23)
CO2: 20 mmol/L — ABNORMAL LOW (ref 22–32)
Calcium: 9.6 mg/dL (ref 8.9–10.3)
Chloride: 102 mmol/L (ref 98–111)
Creatinine, Ser: 2.55 mg/dL — ABNORMAL HIGH (ref 0.44–1.00)
GFR calc Af Amer: 21 mL/min — ABNORMAL LOW (ref >60)
GFR, EST NON AFRICAN AMERICAN: 18 mL/min — AB (ref >60)
Glucose, Bld: 139 mg/dL — ABNORMAL HIGH (ref 70–99)
Potassium: 4 mmol/L (ref 3.5–5.1)
Sodium: 132 mmol/L — ABNORMAL LOW (ref 135–145)

## 2019-01-27 LAB — TYPE AND SCREEN
ABO/RH(D): A POS
Antibody Screen: NEGATIVE
Unit division: 0

## 2019-01-27 LAB — BPAM RBC
Blood Product Expiration Date: 202003192359
ISSUE DATE / TIME: 202002251509
Unit Type and Rh: 6200

## 2019-01-27 LAB — GLUCOSE, CAPILLARY
Glucose-Capillary: 117 mg/dL — ABNORMAL HIGH (ref 70–99)
Glucose-Capillary: 105 mg/dL — ABNORMAL HIGH (ref 70–99)
Glucose-Capillary: 113 mg/dL — ABNORMAL HIGH (ref 70–99)
Glucose-Capillary: 145 mg/dL — ABNORMAL HIGH (ref 70–99)

## 2019-01-27 MED ORDER — ORAL CARE MOUTH RINSE
15.0000 mL | Freq: Two times a day (BID) | OROMUCOSAL | Status: DC
  Administered 2019-01-27 – 2019-01-29 (×3): 15 mL via OROMUCOSAL

## 2019-01-27 MED ORDER — FLUTICASONE PROPIONATE 50 MCG/ACT NA SUSP
1.0000 | Freq: Every day | NASAL | Status: DC
  Administered 2019-01-27 – 2019-01-29 (×2): 1 via NASAL
  Filled 2019-01-27: qty 16

## 2019-01-27 NOTE — Progress Notes (Signed)
Physical Therapy Treatment Patient Details Name: Erika Fry MRN: [id number] DOB: 06-05-1946 Today's Date: 01/27/2019    History of Present Illness Pt admitted with UTI and fever; recent d/c from urology. PMH of lumbar spine osteomyelitis/discitis, CKD, HTN, DM, pacemaker.    PT Comments    Pt progressing towards physical therapy goals. Was able to perform transfers and ambulation with up to min assist for balance support and safety. Pt asking about definite decision regarding admission to CIR. Advised pt that CIR admissions coordinator was facilitating those conversations and would be updating her with a decision once made. Continue to feel pt would benefit from CIR level therapies at d/c. Will continue to follow and address discharge disposition when final decision received regarding CIR.     Follow Up Recommendations  CIR;Supervision/Assistance - 24 hour     Equipment Recommendations  Other (comment)(TBD by next venue of care)    Recommendations for Other Services Rehab consult     Precautions / Restrictions Precautions Precautions: Fall Precaution Comments: Poor safety awareness at times Restrictions Weight Bearing Restrictions: No    Mobility  Bed Mobility               General bed mobility comments: Pt was received attempting to exit bed on her own - bed alarm sounding.  Transfers Overall transfer level: Needs assistance Equipment used: Rolling walker (2 wheeled) Transfers: Sit to/from [facility] Sit to Stand: Min assist Stand pivot transfers: Min assist       General transfer comment: Assist with RW for SPT bed<>BSC.  Ambulation/Gait Ambulation/Gait assistance: Min assist Gait Distance (Feet): 25 Feet Assistive device: Rolling walker (2 wheeled) Gait Pattern/deviations: Step-through pattern Gait velocity: Decreased Gait velocity interpretation: 1.31 - 2.62 ft/sec, indicative of limited community ambulator General Gait Details:  Pt ambulating in room only at this time. Pt ambulated to sink and back to wash hands after using BSC.    Stairs             Wheelchair Mobility    Modified Rankin (Stroke Patients Only)       Balance Overall balance assessment: Needs assistance Sitting-balance support: Bilateral upper extremity supported;Feet supported Sitting balance-Leahy Scale: Fair   Postural control: Right lateral lean Standing balance support: Bilateral upper extremity supported;During functional activity;Single extremity supported Standing balance-Leahy Scale: Poor Standing balance comment: reliance on the AD                            Cognition Arousal/Alertness: Awake/alert Behavior During Therapy: WFL for tasks assessed/performed Overall Cognitive Status: Within Functional Limits for tasks assessed                                        Exercises      General Comments        Pertinent Vitals/Pain Pain Assessment: Faces Faces Pain Scale: Hurts even more Pain Location: Pt reports burning during urination Pain Descriptors / Indicators: Moaning;Burning;Grimacing Pain Intervention(s): Monitored during session    Home Living                      Prior Function            PT Goals (current goals can now be found in the care plan section) Acute Rehab PT Goals Patient Stated Goal: CIR instead of SNF PT Goal Formulation:  With patient Time For Goal Achievement: 02/02/19 Potential to Achieve Goals: Fair Progress towards PT goals: Progressing toward goals    Frequency    Min 3X/week      PT Plan Discharge plan needs to be updated    Co-evaluation              AM-PAC PT "6 Clicks" Mobility   Outcome Measure  Help needed turning from your back to your side while in a flat bed without using bedrails?: A Little Help needed moving from lying on your back to sitting on the side of a flat bed without using bedrails?: A Little Help needed  moving to and from a bed to a chair (including a wheelchair)?: A Little Help needed standing up from a chair using your arms (e.g., wheelchair or bedside chair)?: A Little Help needed to walk in hospital room?: A Little Help needed climbing 3-5 steps with a railing? : Total 6 Click Score: 16    End of Session Equipment Utilized During Treatment: Gait belt Activity Tolerance: Patient limited by fatigue Patient left: in bed;with call bell/phone within reach(MD present) Nurse Communication: Mobility status(Pt in room with MD - not set up with tray or bed alarm) PT Visit Diagnosis: Other abnormalities of gait and mobility (R26.89);Muscle weakness (generalized) (M62.81);Pain Pain - Right/Left: Left Pain - part of body: Leg     Time: 6837-2902 PT Time Calculation (min) (ACUTE ONLY): 19 min  Charges:  $Gait Training: 8-22 mins                     Rolinda Roan, PT, DPT Acute Rehabilitation Services Pager: [phone] Office: [phone]    Thelma Comp 01/27/2019, 12:13 PM

## 2019-01-27 NOTE — Progress Notes (Signed)
PROGRESS NOTE    Erika Fry  GQQ:[id number] DOB: [birth year] DOA: 01/16/2019 PCP: [name], MD   Brief Narrative:  73 y.o.femalewith medical history significantfor stage IV CKD, hypertension, diabetes, L4-L5 discitis, with recent discharge from urology service who presented for poor per oral intake, pelvic pain, lethargy. Work up demonstrated BUN of 99, and creatinine of 6, with pyuria on UA and dysuria. Ceftriaxone was started and nephrology was consulted.  Acute kidney injury resolved.  Ongoing chronic pelvic pain.  ID consulted for FUO, extensive work-up thus far nonrevealing, fevers have resolved and ID signed off.  Hospital course complicated by progressive anemia, suspected slow GI bleed/FOBT +, transfused 1 unit  pRBC 2/25 and GI plans EGD 2/27.  Assessment & Plan:   Principal Problem:   Fever Active Problems:   Aortic stenosis   CKD (chronic kidney disease), stage IV (HCC)   Anemia of chronic disease   Benign essential HTN   Acute lower UTI   Pressure injury of skin   Fever:  -On admission due to UTI symptoms and positive UA, treated with ceftriaxone, urine cultures however were negative and completed 3 days course of ceftriaxone 2/15- 2/17.  -Due to ongoing fevers, ID was consulted and patient underwent extensive evaluation. -CRP 20, ESR 98, CT abdomen and pelvis without contrast 2/18: L4-5 discitis/osteomyelitis similar to prior lumbar MRI, probable gallstones without cholecystitis, right ureteral stent with mild right hydronephrosis and hydroureter. -Orthopedics performed left knee diagnostic arthrocentesis: Synovial fluid showed no crystals, WBCs 255, 61% neutrophils, rest lymphocytes and monocytes.  Synovial fluid culture negative, final. - MRI left knee 2/20: No discrete evidence of soft tissue infection about the knee, medial meniscus tear and tricompartmental osteoarthritis.  Left knee pain has improved after arthrocentesis and patient was able to ambulate  in the room with the help of walker and PT supervision on 2/21. - Repeated blood cultures 2/18 x 2: Negative, final report. - Lumbar spine MRI shows resolution of L4-5 discitis and osteomyelitis. - Had fever of 102.9 on night of 2/20 and has been afebrile since.  Monitoring off of antibiotics.  Fevers resolved.  ID signoff note 2/24 appreciated, initial fevers may be related to urological instrumentation/complicated UTI for which she had been on fosfomycin prior to admission.  Left knee pain Unclear etiology.  Management as noted above.  Acute pain seems to have resolved.  Ambulating better.  MRI with OA and meniscus tears.  AKI on stage IV CKD:  As per nephrology sign off 2/19, patient's recent baseline creatinine was progressing and was 5.3 prior to urological procedures.  She follows with Dr. Johnney Ou, Nephrology. -Acute kidney injury was felt due to dehydration and obstructive etiology/hydronephrosis. -Post IV fluid hydration and right ureteral stent placement and lithotripsy on 01/14/2019, creatinine has steadily improved.  Continue to follow BMP. -Outpatient follow-up with Dr. Johnney Ou, Nephrology will arrange. -Creatinine has fluctuated in the 2.5-2.8 for the last few days and probably new baseline and stable.  Continue to follow BMP closely (cr 2.55 today).  Foley catheter discontinued 2/20.  Off of IV fluids as well.  Acute kidney injury resolved.  NAGMA: continue to monitor  Acute metabolic encephalopathy: Possibly due to uremia vs. FUO.  -Resolved.  Anemia of chronic disease:  -Hemoglobin had been stable in the 7.5 to 8 g range for several days.  "Dark stools" were then reported by patient after several days history of constipation.  Due to stable hemoglobin and being on iron tablets, her dark stools were initially attributed  to iron supplements and were considering elective colonoscopy as outpatient. -Subsequently however her hemoglobin gradually drifted down to 6.4 on 2/25.  There  is concern for slow GI bleed (see below). -S/p 1 unit PRBC on 2/25.   - GI c/s, appreciate recs - planning for EGD on 2/27  Benign essential HTN:  - Continue home medications.  Mildly controlled at times.  Stage I pressure injury bilateral buttocks. Not known POA status.  - Offload as able.  Obesity/Body mass index is 32.57 kg/m. - Weight loss long term recommended.  Chronic pelvic pain - Status post cystoscopy, hydrodistention, right ureteral stent placement and lithotripsy by Dr. Louis Meckel, Urology on 01/14/2018.   - Had ongoing severe PV pain on 2/20 and patient stated "I want the stent out".  Previous provider discussed with Dr. Louis Meckel on 2/20, discontinued Foley catheter with improvement in vaginal pain, then having persistent dysuria-longstanding since before Thanksgiving, continued Myrbetriq, [place], treated constipation, discussed again with urology on 2/23, suspected possible stent related discomfort and recommended trial of tamsulosin but otherwise not many options.  Unable to use Pyridium due to chronic kidney disease. - Would prefer not to DC ureteral stent due to recent acute kidney injury and obstruction. - Outpatient follow-up with Dr. Louis Meckel  L4-5 discitis/osteomyelitis Has completed course of IV followed by oral antibiotics.  Lumbar spine MRI shows resolution.  DM2 A1c 6.3 on 04/30/2018.  Does not appear that she was on medications PTA/possibly diet controlled.  CBGs show good inpatient control.  GERD Due to concern for possible slow GI bleed, just [facility] has changed Pepcid to PPI.  Hypothyroid Continue Synthroid.  S/p PPM.  Hypokalemia: Replaced.  "Dark stools"/FOBT + Following bowel regimen , patient having "dark stools". Suspected initially due to iron supplements.  FOBT positive but hemoglobin was stable.  Had EGD June 2019 when she had [name] friable polyp removed but was unable to follow-up for colonoscopy. Then patient started to have  gradually dropping hemoglobin and multiple loose BMs.  [facility] consulted. Concerned that she may be having recurrent oozing from prior gastric polyp area and plan EGD 2/27. and do not think that she can tolerate colonoscopy at this time. As per GI, patient stools had not been dark at home despite iron supplements but now are dark.  Leukocytosis: mild, follow closely.  Afebrile.   DVT prophylaxis: SCD Code Status: (full Family Communication: none at bedside Disposition Plan: pending GI evaluation and possible CIR placement.     Consultants:   GI  Nephrology ID  Urology  Orthopedics  Procedures:   Foley catheter d/c 2/20  Antimicrobials:  Anti-infectives (From admission, onward)   Start     Dose/Rate Route Frequency Ordered Stop   01/17/19 2200  cefTRIAXone (ROCEPHIN) 1 g in sodium chloride 0.9 % 100 mL IVPB  Status:  Discontinued     1 g 200 mL/hr over 30 Minutes Intravenous Every 24 hours 01/17/19 0054 01/19/19 0914   01/16/19 2345  cefTRIAXone (ROCEPHIN) 1 g in sodium chloride 0.9 % 100 mL IVPB     1 g 200 mL/hr over 30 Minutes Intravenous  Once 01/16/19 2330 01/17/19 0055     Subjective: C/o persistent dysuria.  Frequency.   Was walking without assistive device prior to admission. Now requiring walker.  Objective: Vitals:   01/26/19 1859 01/26/19 1953 01/27/19 0500 01/27/19 0522  BP: 138/74 (!) 146/59  (!) 149/78  Pulse: 85 86  84  Resp: 15 19  (!) 21  Temp:  98.1 F (36.7 C) 97.9 F (36.6 C)  98.1 F (36.7 C)  TempSrc: Oral Oral  Oral  SpO2: 100% 100%  100%  Weight:  84 kg 84 kg   Height:        Intake/Output Summary (Last 24 hours) at 01/27/2019 1504 Last data filed at 01/27/2019 1330 Gross per 24 hour  Intake 1624 ml  Output 1700 ml  Net -76 ml   Filed Weights   01/23/19 2145 01/26/19 1953 01/27/19 0500  Weight: 88.8 kg 84 kg 84 kg    Examination:  General exam: Appears calm and comfortable  Respiratory system: Clear to auscultation.  Respiratory effort normal. Cardiovascular system: S1 & S2 heard, RRR.  Gastrointestinal system: Abdomen is nondistended, soft and nontender Central nervous system: Alert and oriented. No focal neurological deficits. Extremities: trace LEE bilaterally Skin: No rashes, lesions or ulcers Psychiatry: Judgement and insight appear normal. Mood & affect appropriate.     Data Reviewed: I have personally reviewed following labs and imaging studies  CBC: Recent Labs  Lab 01/25/19 0522 01/25/19 0802 01/25/19 2002 01/26/19 0621 01/27/19 0338  WBC 10.5 11.4* 10.3 9.0 12.6*  HGB 6.7* 7.3* 7.6* 6.4* 8.3*  HCT 21.0* 23.8* 24.5* 20.3* 26.1*  MCV 91.7 92.6 93.5 94.4 91.6  PLT 299 332 302 297 400   Basic Metabolic Panel: Recent Labs  Lab 01/23/19 0836 01/24/19 0332 01/25/19 0522 01/26/19 0621 01/27/19 0338  NA 132* 133* 132* 134* 132*  K 3.8 3.4* 3.6 3.7 4.0  CL 100 103 103 103 102  CO2 21* 18* 20* 22 20*  GLUCOSE 107* 122* 138* 151* 139*  BUN 41* 36* 30* 29* 27*  CREATININE 2.85* 2.80* 2.54* 2.65* 2.55*  CALCIUM 9.1 9.0 9.0 9.1 9.6   GFR: Estimated Creatinine Clearance: 20.7 mL/min (Herlinda Heady) (by C-G formula based on SCr of 2.55 mg/dL (H)). Liver Function Tests: No results for input(s): AST, ALT, ALKPHOS, BILITOT, PROT, ALBUMIN in the last 168 hours. No results for input(s): LIPASE, AMYLASE in the last 168 hours. No results for input(s): AMMONIA in the last 168 hours. Coagulation Profile: No results for input(s): INR, PROTIME in the last 168 hours. Cardiac Enzymes: No results for input(s): CKTOTAL, CKMB, CKMBINDEX, TROPONINI in the last 168 hours. BNP (last 3 results) No results for input(s): PROBNP in the last 8760 hours. HbA1C: No results for input(s): HGBA1C in the last 72 hours. CBG: Recent Labs  Lab 01/26/19 1124 01/26/19 1658 01/26/19 1955 01/27/19 0750 01/27/19 1121  GLUCAP 124* 118* 133* 117* 105*   Lipid Profile: No results for input(s): CHOL, HDL, LDLCALC, TRIG,  CHOLHDL, LDLDIRECT in the last 72 hours. Thyroid Function Tests: No results for input(s): TSH, T4TOTAL, FREET4, T3FREE, THYROIDAB in the last 72 hours. Anemia Panel: Recent Labs    01/25/19 0802  VITAMINB12 2,132*  FOLATE 27.4  FERRITIN 336*  TIBC 171*  IRON 20*  RETICCTPCT 2.2   Sepsis Labs: No results for input(s): PROCALCITON, LATICACIDVEN in the last 168 hours.  Recent Results (from the past 240 hour(s))  Culture, blood (Routine X 2) w Reflex to ID Panel     Status: None   Collection Time: 01/19/19 10:50 PM  Result Value Ref Range Status   Specimen Description BLOOD RIGHT HAND  Final   Special Requests   Final    BOTTLES DRAWN AEROBIC ONLY Blood Culture adequate volume   Culture   Final    NO GROWTH 5 DAYS Performed at [facility], [address],  [place], [place] [address]    Report Status 01/25/2019 FINAL  Final  Culture, blood (Routine X 2) w Reflex to ID Panel     Status: None   Collection Time: 01/19/19 10:50 PM  Result Value Ref Range Status   Specimen Description BLOOD RIGHT ANTECUBITAL  Final   Special Requests   Final    BOTTLES DRAWN AEROBIC AND ANAEROBIC Blood Culture adequate volume   Culture   Final    NO GROWTH 5 DAYS Performed at [facility], [place] [address], [place], [place] [address]    Report Status 01/25/2019 FINAL  Final  Body fluid culture     Status: None   Collection Time: 01/20/19 10:35 AM  Result Value Ref Range Status   Specimen Description SYNOVIAL LEFT KNEE  Final   Special Requests NONE  Final   Gram Stain   Final    RARE WBC PRESENT, PREDOMINANTLY PMN NO ORGANISMS SEEN Performed at [facility], [place] [address], [place], [place] [address]    Culture NO GROWTH  Final   Report Status 01/23/2019 FINAL  Final         Radiology Studies: No results found.      Scheduled Meds: . sodium chloride   Intravenous Once  . ferrous sulfate  325 mg Oral BID WC  . fesoterodine  4 mg Oral QHS  . folic acid  1 mg  Oral Daily  . levothyroxine  75 mcg Oral QAC breakfast  . loratadine  10 mg Oral Daily  . mouth rinse  15 mL Mouth Rinse BID  . mirabegron ER  50 mg Oral Daily  . pantoprazole  40 mg Oral BID  . senna  1 tablet Oral Daily  . tamsulosin  0.4 mg Oral Daily  . vitamin B-12  1,000 mcg Oral Daily   Continuous Infusions:   LOS: 10 days    Time spent: over 30 min    Fayrene Helper, MD [facility] Pager AMION  If 7PM-7AM, please contact night-coverage [email or web address] Password [facility] 01/27/2019, 3:04 PM

## 2019-01-27 NOTE — H&P (View-Only) (Signed)
     [facility] Progress Note  CC:  Anemia, heme+ stool   Subjective: No abdominal pain. No BM this am. Up urinating every few hours during the night. Burning with urination.   Objective:  Vital signs in last 24 hours: Temp:  [97.6 F (36.4 C)-98.1 F (36.7 C)] 98.1 F (36.7 C) (02/26 0522) Pulse Rate:  [83-90] 84 (02/26 0522) Resp:  [15-21] 21 (02/26 0522) BP: (129-149)/(59-78) 149/78 (02/26 0522) SpO2:  [100 %] 100 % (02/26 0522) Weight:  [84 kg] 84 kg (02/26 0500) Last BM Date: 01/26/19 General:   Alert,  Well-developed,    in NAD Heart:  Regular rate and rhythm, 2/6 systolic murmur. Pulm: clear throughout. Abdomen:  Soft, nontender and nondistended. Normal bowel sounds, without guarding, and without rebound.   Extremities:  Without edema. Neurologic:  Alert and  oriented x4;  grossly normal neurologically. Psych:  Alert and cooperative. Normal mood and affect.  Intake/Output from previous day: 02/25 0701 - 02/26 0700 In: 1584 [P.O.:840; Blood:744] Out: 1950 [[id number]] Intake/Output this shift: No intake/output data recorded.  Lab Results: Recent Labs    01/25/19 2002 01/26/19 1791 01/27/19 0338  WBC 10.3 9.0 12.6*  HGB 7.6* 6.4* 8.3*  HCT 24.5* 20.3* 26.1*  PLT 302 297 329   BMET Recent Labs    01/25/19 0522 01/26/19 0621 01/27/19 0338  NA 132* 134* 132*  K 3.6 3.7 4.0  CL 103 103 102  CO2 20* 22 20*  GLUCOSE 138* 151* 139*  BUN 30* 29* 27*  CREATININE 2.54* 2.65* 2.55*  CALCIUM 9.0 9.1 9.6   Assessment / Plan:  61. 73 year old female with anemia and UGI bleed/heme + stool, Hx of a 74mm  pedunculated friable polyp (hyperplastic) was found in the pylorus. Repeat EGD, possible polyp resection if polyp oozing. Received 1 unit of PRBCs 01/26/2019. Hg 8.3 up from 6.4.  -EGD rescheduled 11 am 01/28/2019 -soft heart healthy diet today then NPO after midnight  2. 3. Ongoing dysuria. Getting bladder analgesics but still burning. Had cysto /  stent placed on 2/13. Urine culture negative. -further management per hospitalist and urology   3. Chronic Kidney Disease. Cr. 2.55    LOS: 10 days   Noralyn Pick  01/27/2019, 9:01 AM

## 2019-01-27 NOTE — Progress Notes (Signed)
Inpatient Rehabilitation Admissions Coordinator  Dr. Florene Glen has completed a peer to peer with [name] and the denial remains for admission to inpt rehab.I met with patient at bedside and she is aware. She does not want SNF. She prefers [facility] and her daughter, [name] to assist. I have alerted RN CM. We will signoff at this time. She wants to use the same [facility] she used last year.   Danne Baxter, RN, MSN Rehab Admissions Coordinator [phone] 01/27/2019 4:46 PM

## 2019-01-27 NOTE — Progress Notes (Addendum)
     [facility] Progress Note  CC:  Anemia, heme+ stool   Subjective: No abdominal pain. No BM this am. Up urinating every few hours during the night. Burning with urination.   Objective:  Vital signs in last 24 hours: Temp:  [97.6 F (36.4 C)-98.1 F (36.7 C)] 98.1 F (36.7 C) (02/26 0522) Pulse Rate:  [83-90] 84 (02/26 0522) Resp:  [15-21] 21 (02/26 0522) BP: (129-149)/(59-78) 149/78 (02/26 0522) SpO2:  [100 %] 100 % (02/26 0522) Weight:  [84 kg] 84 kg (02/26 0500) Last BM Date: 01/26/19 General:   Alert,  Well-developed,    in NAD Heart:  Regular rate and rhythm, 2/6 systolic murmur. Pulm: clear throughout. Abdomen:  Soft, nontender and nondistended. Normal bowel sounds, without guarding, and without rebound.   Extremities:  Without edema. Neurologic:  Alert and  oriented x4;  grossly normal neurologically. Psych:  Alert and cooperative. Normal mood and affect.  Intake/Output from previous day: 02/25 0701 - 02/26 0700 In: 1584 [P.O.:840; Blood:744] Out: 1950 [[id number]] Intake/Output this shift: No intake/output data recorded.  Lab Results: Recent Labs    01/25/19 2002 01/26/19 4619 01/27/19 0338  WBC 10.3 9.0 12.6*  HGB 7.6* 6.4* 8.3*  HCT 24.5* 20.3* 26.1*  PLT 302 297 329   BMET Recent Labs    01/25/19 0522 01/26/19 0621 01/27/19 0338  NA 132* 134* 132*  K 3.6 3.7 4.0  CL 103 103 102  CO2 20* 22 20*  GLUCOSE 138* 151* 139*  BUN 30* 29* 27*  CREATININE 2.54* 2.65* 2.55*  CALCIUM 9.0 9.1 9.6   Assessment / Plan:  41. 73 year old female with anemia and UGI bleed/heme + stool, Hx of a 58mm  pedunculated friable polyp (hyperplastic) was found in the pylorus. Repeat EGD, possible polyp resection if polyp oozing. Received 1 unit of PRBCs 01/26/2019. Hg 8.3 up from 6.4.  -EGD rescheduled 11 am 01/28/2019 -soft heart healthy diet today then NPO after midnight  2. 3. Ongoing dysuria. Getting bladder analgesics but still burning. Had cysto /  stent placed on 2/13. Urine culture negative. -further management per hospitalist and urology   3. Chronic Kidney Disease. Cr. 2.55    LOS: 10 days   Noralyn Pick  01/27/2019, 9:01 AM

## 2019-01-27 NOTE — Care Management Note (Signed)
Case Management Note Manya Silvas, RN MSN CCM Transitions of Care 54M [place] [phone]  Patient Details  Name: Erika Fry MRN: [id number] Date of Birth: 07/01/1946  Subjective/Objective:         Fever          Action/Plan: Spoke with patient at bedside. PTA patient home alone. Has very good support from 3 sons/dils who assist with picking up medications, medical appointments, or whatever is needed. Has a cell phone for emergencies. Has DME-walker, cane, wheelchair, 3/1, shower chair, and grabber. Offered choice for HH-pt would like same company as used last year. Verified [facility]-referral accepted for PT, OT, and Aide. Patient will need [facility] orders for PT, OT, and Aide with Face to Face when medically ready to transition home. CM to follow for transition of care needs.   Expected Discharge Date:                  Expected Discharge Plan:  [place]  In-House Referral:  Clinical Social Work  Discharge planning Services  CM Consult  Post Acute Care Choice:  Home Health Choice offered to:  Patient  DME Arranged:  N/A DME Agency:  NA  HH Arranged:  PT, OT, Nurse's Aide [place] Agency:  [facility] [employer]  Status of Service:  In process, will continue to follow  If discussed at Long Length of Stay Meetings, dates discussed:    Additional Comments:  Bartholomew Crews, RN 01/27/2019, 5:21 PM

## 2019-01-28 ENCOUNTER — Encounter (HOSPITAL_COMMUNITY): Payer: Self-pay | Admitting: *Deleted

## 2019-01-28 ENCOUNTER — Inpatient Hospital Stay (HOSPITAL_COMMUNITY): Payer: Medicare Other | Admitting: Certified Registered Nurse Anesthetist

## 2019-01-28 ENCOUNTER — Encounter (HOSPITAL_COMMUNITY)
Admission: EM | Disposition: A | Discharge status: Home Health | Payer: Self-pay | Source: Home / Self Care | Attending: Internal Medicine

## 2019-01-28 HISTORY — PX: ESOPHAGOGASTRODUODENOSCOPY (EGD) WITH PROPOFOL: SHX5813

## 2019-01-28 HISTORY — PX: POLYPECTOMY: SHX5525

## 2019-01-28 LAB — COMPREHENSIVE METABOLIC PANEL
ALT: 14 U/L (ref 0–44)
ANION GAP: 7 (ref 5–15)
AST: 18 U/L (ref 15–41)
Albumin: 2.4 g/dL — ABNORMAL LOW (ref 3.5–5.0)
Alkaline Phosphatase: 92 U/L (ref 38–126)
BUN: 25 mg/dL — ABNORMAL HIGH (ref 8–23)
CO2: 21 mmol/L — ABNORMAL LOW (ref 22–32)
Calcium: 9.2 mg/dL (ref 8.9–10.3)
Chloride: 103 mmol/L (ref 98–111)
Creatinine, Ser: 2.7 mg/dL — ABNORMAL HIGH (ref 0.44–1.00)
GFR calc non Af Amer: 17 mL/min — ABNORMAL LOW (ref >60)
GFR, EST AFRICAN AMERICAN: 19 mL/min — AB (ref >60)
Glucose, Bld: 152 mg/dL — ABNORMAL HIGH (ref 70–99)
Potassium: 3.7 mmol/L (ref 3.5–5.1)
Sodium: 131 mmol/L — ABNORMAL LOW (ref 135–145)
TOTAL PROTEIN: 5.3 g/dL — AB (ref 6.5–8.1)
Total Bilirubin: 0.7 mg/dL (ref 0.3–1.2)

## 2019-01-28 LAB — CBC
HCT: 23.5 % — ABNORMAL LOW (ref 36.0–46.0)
HEMOGLOBIN: 7.4 g/dL — AB (ref 12.0–15.0)
MCH: 28.9 pg (ref 26.0–34.0)
MCHC: 31.5 g/dL (ref 30.0–36.0)
MCV: 91.8 fL (ref 80.0–100.0)
Platelets: 315 10*3/uL (ref 150–400)
RBC: 2.56 MIL/uL — AB (ref 3.87–5.11)
RDW: 14.6 % (ref 11.5–15.5)
WBC: 9.4 10*3/uL (ref 4.0–10.5)
nRBC: 0 % (ref 0.0–0.2)

## 2019-01-28 LAB — GLUCOSE, CAPILLARY
Glucose-Capillary: 122 mg/dL — ABNORMAL HIGH (ref 70–99)
Glucose-Capillary: 115 mg/dL — ABNORMAL HIGH (ref 70–99)
Glucose-Capillary: 104 mg/dL — ABNORMAL HIGH (ref 70–99)
Glucose-Capillary: 90 mg/dL (ref 70–99)

## 2019-01-28 LAB — HEMOGLOBIN AND HEMATOCRIT, BLOOD
HCT: 25.8 % — ABNORMAL LOW (ref 36.0–46.0)
HEMOGLOBIN: 8.3 g/dL — AB (ref 12.0–15.0)

## 2019-01-28 LAB — MAGNESIUM: MAGNESIUM: 1.7 mg/dL (ref 1.7–2.4)

## 2019-01-28 SURGERY — ESOPHAGOGASTRODUODENOSCOPY (EGD) WITH PROPOFOL
Anesthesia: Monitor Anesthesia Care

## 2019-01-28 MED ORDER — FENTANYL CITRATE (PF) 100 MCG/2ML IJ SOLN
INTRAMUSCULAR | Status: AC
  Filled 2019-01-28: qty 2

## 2019-01-28 MED ORDER — EPINEPHRINE PF 1 MG/10ML IJ SOSY
PREFILLED_SYRINGE | INTRAMUSCULAR | Status: AC
  Filled 2019-01-28: qty 10

## 2019-01-28 MED ORDER — PROPOFOL 500 MG/50ML IV EMUL
INTRAVENOUS | Status: DC | PRN
  Administered 2019-01-28: 100 ug/kg/min via INTRAVENOUS

## 2019-01-28 MED ORDER — PROPOFOL 10 MG/ML IV BOLUS
INTRAVENOUS | Status: DC | PRN
  Administered 2019-01-28 (×3): 20 mg via INTRAVENOUS

## 2019-01-28 MED ORDER — HYDROCODONE-ACETAMINOPHEN 5-325 MG PO TABS
2.0000 | ORAL_TABLET | Freq: Once | ORAL | Status: AC
  Administered 2019-01-28: 2 via ORAL

## 2019-01-28 MED ORDER — FENTANYL CITRATE (PF) 100 MCG/2ML IJ SOLN
25.0000 ug | Freq: Once | INTRAMUSCULAR | Status: AC
  Administered 2019-01-28: 25 ug via INTRAVENOUS
  Filled 2019-01-28: qty 2

## 2019-01-28 MED ORDER — SODIUM CHLORIDE (PF) 0.9 % IJ SOLN
PREFILLED_SYRINGE | INTRAMUSCULAR | Status: DC | PRN
  Administered 2019-01-28: 2.5 mL

## 2019-01-28 MED ORDER — FENTANYL CITRATE (PF) 100 MCG/2ML IJ SOLN
25.0000 ug | Freq: Once | INTRAMUSCULAR | Status: AC
  Administered 2019-01-28: 25 ug via INTRAVENOUS

## 2019-01-28 MED ORDER — LIDOCAINE 2% (20 MG/ML) 5 ML SYRINGE
INTRAMUSCULAR | Status: DC | PRN
  Administered 2019-01-28: 80 mg via INTRAVENOUS

## 2019-01-28 MED ORDER — SODIUM CHLORIDE 0.9 % IV SOLN
INTRAVENOUS | Status: DC | PRN
  Administered 2019-01-28: 10:00:00 via INTRAVENOUS

## 2019-01-28 MED ORDER — BELLADONNA ALKALOIDS-OPIUM 16.2-60 MG RE SUPP
1.0000 | Freq: Four times a day (QID) | RECTAL | Status: DC | PRN
  Administered 2019-01-28: 1 via RECTAL
  Filled 2019-01-28 (×2): qty 1

## 2019-01-28 SURGICAL SUPPLY — 15 items

## 2019-01-28 NOTE — Anesthesia Preprocedure Evaluation (Addendum)
Anesthesia Evaluation  Patient identified by MRN, date of birth, ID band Patient awake    Reviewed: Allergy & Precautions, NPO status , Patient's Chart, lab work & pertinent test results  Airway Mallampati: III  TM Distance: >3 FB Neck ROM: Full  Mouth opening: Limited Mouth Opening  Dental no notable dental hx. (+) Teeth Intact, Dental Advisory Given   Pulmonary neg pulmonary ROS,    Pulmonary exam normal breath sounds clear to auscultation       Cardiovascular hypertension, Normal cardiovascular exam+ pacemaker (for 2nd degree heart block)  Rhythm:Regular Rate:Normal  TEE 2019 - Hyperdynamic LV with severe basal septal hypertrophy and LVOT obstruction. No significant valvular stenosis. No SAM, no subaortic membrane. Mild mitral regurgitation.  [place] 2019 No pulmonary hypertension Normal cardiac output    Neuro/Psych negative neurological ROS  negative psych ROS   GI/Hepatic Neg liver ROS, GERD  Medicated,  Endo/Other  diabetes, Type 2, Insulin DependentHypothyroidism   Renal/GU Renal InsufficiencyRenal disease  negative genitourinary   Musculoskeletal  (+) Arthritis ,   Abdominal   Peds  Hematology  (+) Blood dyscrasia (Hgb 7.4), anemia ,   Anesthesia Other Findings EGD for anemia, heme positive stool  Reproductive/Obstetrics negative OB ROS                            Anesthesia Physical Anesthesia Plan  ASA: III  Anesthesia Plan: MAC   Post-op Pain Management:    Induction: Intravenous  PONV Risk Score and Plan: 3 and Propofol infusion and Treatment may vary due to age or medical condition  Airway Management Planned: Natural Airway  Additional Equipment:   Intra-op Plan:   Post-operative Plan:   Informed Consent: I have reviewed the patients History and Physical, chart, labs and discussed the procedure including the risks, benefits and alternatives for the proposed  anesthesia with the patient or authorized representative who has indicated his/her understanding and acceptance.     Dental advisory given  Plan Discussed with: CRNA  Anesthesia Plan Comments:        Anesthesia Quick Evaluation

## 2019-01-28 NOTE — Interval H&P Note (Signed)
History and Physical Interval Note:  01/28/2019 8:33 AM  Erika Fry  has presented today for surgery, with the diagnosis of anemia, dark heme positive stool  The various methods of treatment have been discussed with the patient and family. After consideration of risks, benefits and other options for treatment, the patient has consented to  Procedure(s): ESOPHAGOGASTRODUODENOSCOPY (EGD) WITH PROPOFOL (N/A) as a surgical intervention .  The patient's history has been reviewed, patient examined, no change in status, stable for surgery.  I have reviewed the patient's chart and labs.  Questions were answered to the patient's satisfaction.     [employer]

## 2019-01-28 NOTE — Progress Notes (Signed)
   01/28/19 1100  OT Visit Information  Last OT Received On 01/28/19  Assistance Needed +1  Reason Eval/Treat Not Completed Patient at procedure or test/ unavailable;Other (comment) (endoscopy)  History of Present Illness Pt admitted with UTI and fever; recent d/c from urology. PMH of lumbar spine osteomyelitis/discitis, CKD, HTN, DM, pacemaker.   Tyrone Schimke, OT Acute Rehabilitation Services Pager: [phone] Office: [phone]

## 2019-01-28 NOTE — Progress Notes (Signed)
PT Cancellation Note  Patient Details Name: Erika Fry MRN: [id number] DOB: [birth year]   Cancelled Treatment:    Reason Eval/Treat Not Completed: Patient at procedure or test/unavailable EGD. Will follow-up as time allows  Lanney Gins, PT, DPT Supplemental Physical Therapist 01/28/19 10:09 AM Pager: [phone] Office: [phone]

## 2019-01-28 NOTE — Anesthesia Procedure Notes (Signed)
Procedure Name: MAC Performed by: Manasa Spease B, CRNA Pre-anesthesia Checklist: Patient identified, Emergency Drugs available, Suction available, Patient being monitored and Timeout performed Patient Re-evaluated:Patient Re-evaluated prior to induction Oxygen Delivery Method: Nasal cannula Preoxygenation: Pre-oxygenation with 100% oxygen Induction Type: IV induction Airway Equipment and Method: Bite block Placement Confirmation: positive ETCO2 Dental Injury: Teeth and Oropharynx as per pre-operative assessment        

## 2019-01-28 NOTE — Care Management Note (Addendum)
Case Management Note Manya Silvas, RN MSN CCM Transitions of Care 12M [place] [phone]  Patient Details  Name: Erika Fry MRN: [id number] Date of Birth: 12-Aug-1946  Subjective/Objective:         Fever          Action/Plan: 01/28/2019-Asked by primary RN to speak with patient and son about discharge plan. Spoke with patient at bedside and her youngest son and older son was on speaker phone. Sons expressed concerns about patient having enough help at home. Discussed that it was patient's choice to go home, and that [facility] was set up for PT/OT/Aide. Discussed adding in a SW to assist with transition for SNF if needed-referral made to [facility], and PCP could be the referring MD. Discussed that managed medicare does not require 3 day hospitalization for SNF. Patient insistent that she will be fine at home with [facility], and states her brother will assist with meals and picking up prescriptions. Discussed that if sons desired additional assistance in the home that it would be private pay unless the patient had long term care insurance. Patient insisted that she will be fine at home. When ready to transition home, patient will need HH order for PT, OT, Aide, SW with [facility].    01/27/2019-Spoke with patient at bedside. PTA patient home alone. Has very good support from 3 sons/dils who assist with picking up medications, medical appointments, or whatever is needed. Has a cell phone for emergencies. Has DME-walker, cane, wheelchair, 3/1, shower chair, and grabber. Offered choice for HH-pt would like same company as used last year. Verified [facility]-referral accepted for PT, OT, and Aide. Patient will need [place] orders for PT, OT, and Aide with [facility] when medically ready to transition home. CM to follow for transition of care needs.   Expected Discharge Date:                  Expected Discharge Plan:  [place]  In-House Referral:  Clinical Social Work  Discharge planning Services  CM  Consult  Post Acute Care Choice:  Home Health Choice offered to:  Patient  DME Arranged:  N/A DME Agency:  NA  HH Arranged:  PT, OT, Nurse's Aide [place] Agency:  [facility] [employer]  Status of Service:  In process, will continue to follow  If discussed at Long Length of Stay Meetings, dates discussed:    Additional Comments:  Bartholomew Crews, RN 01/28/2019, 1:37 PM

## 2019-01-28 NOTE — Progress Notes (Signed)
PROGRESS NOTE    Erika Fry  FGH:[id number] DOB: 27-Nov-1946 DOA: 01/16/2019 PCP: [name], MD   Brief Narrative:  73 y.o.femalewith medical history significantfor stage IV CKD, hypertension, diabetes, L4-L5 discitis, with recent discharge from urology service who presented for poor per oral intake, pelvic pain, lethargy. Work up demonstrated BUN of 99, and creatinine of 6, with pyuria on UA and dysuria. Ceftriaxone was started and nephrology was consulted.  Acute kidney injury resolved.  Ongoing chronic pelvic pain.  ID consulted for FUO, extensive work-up thus far nonrevealing, fevers have resolved and ID signed off.  Hospital course complicated by progressive anemia, suspected slow GI bleed/FOBT +, transfused 1 unit  pRBC 2/25 and GI plans EGD 2/27.  Assessment & Plan:   Principal Problem:   Fever Active Problems:   Aortic stenosis   CKD (chronic kidney disease), stage IV (HCC)   Anemia of chronic disease   Benign essential HTN   Acute lower UTI   Pressure injury of skin   Fever:  -On admission due to UTI symptoms and positive UA, treated with ceftriaxone, urine cultures however were negative and completed 3 days course of ceftriaxone 2/15- 2/17.  -Due to ongoing fevers, ID was consulted and patient underwent extensive evaluation. -CRP 20, ESR 98, CT abdomen and pelvis without contrast 2/18: L4-5 discitis/osteomyelitis similar to prior lumbar MRI, probable gallstones without cholecystitis, right ureteral stent with mild right hydronephrosis and hydroureter. -Orthopedics performed left knee diagnostic arthrocentesis: Synovial fluid showed no crystals, WBCs 255, 61% neutrophils, rest lymphocytes and monocytes.  Synovial fluid culture negative, final. - MRI left knee 2/20: No discrete evidence of soft tissue infection about the knee, medial meniscus tear and tricompartmental osteoarthritis.  Left knee pain has improved after arthrocentesis and patient was able to ambulate  in the room with the help of walker and PT supervision on 2/21. - Repeated blood cultures 2/18 x 2: Negative, final report. - Lumbar spine MRI shows resolution of L4-5 discitis and osteomyelitis. - Had fever of 102.9 on night of 2/20 and has been afebrile since.  Monitoring off of antibiotics.  Fevers resolved.  ID signoff note 2/24 appreciated, initial fevers may be related to urological instrumentation/complicated UTI for which she had been on fosfomycin prior to admission.  Left knee pain Unclear etiology.  Management as noted above.  Acute pain seems to have resolved.  Ambulating better.  MRI with OA and meniscus tears. Follow outpatient  AKI on stage IV CKD:  As per nephrology sign off 2/19, patient's recent baseline creatinine was progressing and was 5.3 prior to urological procedures.  She follows with Dr. Johnney Ou, Nephrology. -Acute kidney injury was felt due to dehydration and obstructive etiology/hydronephrosis. -Post IV fluid hydration and right ureteral stent placement and lithotripsy on 01/14/2019, creatinine has steadily improved.  Continue to follow BMP. -Outpatient follow-up with Dr. Johnney Ou, Nephrology will arrange. -Creatinine has fluctuated in the 2.5-2.8 for the last few days and probably new baseline and stable.  Continue to follow BMP closely (cr 2.70 today).  Foley catheter discontinued 2/20.  Off of IV fluids as well.  Acute kidney injury resolved.  NAGMA: continue to monitor  Acute metabolic encephalopathy: Possibly due to uremia vs. FUO.  -Resolved.  Anemia of chronic disease:  -Hemoglobin had been stable in the 7.5 to 8 g range for several days.  "Dark stools" were then reported by patient after several days history of constipation.  Due to stable hemoglobin and being on iron tablets, her dark stools were  initially attributed to iron supplements and were considering elective colonoscopy as outpatient. -Subsequently however her hemoglobin gradually drifted down to  6.4 on 2/25.  There is concern for slow GI bleed (see below). -S/p 1 unit PRBC on 2/25.   - s/p EGD with GI on 2/27 with gastric polyp with stigmata of recent bleeding removed.  See below. - Follow biopsy. - Soft diet x 72 hours, BID PPI x 1 month, then daily, follow up in outpatient clinic to consider colonoscopy  Benign essential HTN:  - Continue home medications.  Mildly controlled at times.  Stage I pressure injury bilateral buttocks. Not known POA status.  - Offload as able.  Obesity/Body mass index is 32.57 kg/m. - Weight loss long term recommended.  Chronic pelvic pain - Status post cystoscopy, hydrodistention, right ureteral stent placement and lithotripsy by Dr. Louis Meckel, Urology on 01/14/2018.   - Had ongoing severe PV pain on 2/20 and patient stated "I want the stent out".  Previous provider discussed with Dr. Louis Meckel on 2/20, discontinued Foley catheter with improvement in vaginal pain, then having persistent dysuria-longstanding since before Thanksgiving, continued Myrbetriq, [place], treated constipation, discussed again with urology on 2/23 by previous provider, suspected possible stent related discomfort and recommended trial of tamsulosin but otherwise not many options.  Unable to use Pyridium due to chronic kidney disease.  Discussed again with urology 2/27, will try b&o suppositories if able. - Would prefer not to DC ureteral stent due to recent acute kidney injury and obstruction. - Outpatient follow-up with Dr. Louis Meckel  L4-5 discitis/osteomyelitis Has completed course of IV followed by oral antibiotics.  Lumbar spine MRI shows resolution.  DM2 A1c 6.3 on 04/30/2018.  Does not appear that she was on medications PTA/possibly diet controlled.  CBGs show good inpatient control.  GERD Due to concern for possible slow GI bleed, just [facility] has changed Pepcid to PPI.  Hypothyroid Continue Synthroid.  S/p PPM.  Hypokalemia: Replaced.  "Dark  stools"/FOBT + Following bowel regimen , patient having "dark stools". Suspected initially due to iron supplements.  FOBT positive but hemoglobin was stable.  Had EGD June 2019 when she had a friable polyp removed but was unable to follow-up for colonoscopy. Then patient started to have gradually dropping hemoglobin and multiple loose BMs.  [facility] consulted. Concerned that she may be having recurrent oozing from prior gastric polyp area and plan EGD 2/27. and do not think that she can tolerate colonoscopy at this time. As per GI, patient stools had not been dark at home despite iron supplements but now are dark. - s/p EGD with GI on 2/27 with gastric polyp with stigmata of recent bleeding, removed.  See below. - Follow biopsy. - Soft diet x 72 hours, BID PPI x 1 month, then daily, follow up in outpatient clinic to consider colonoscopy  Leukocytosis: mild, follow closely.  Afebrile.   DVT prophylaxis: SCD Code Status: full Family Communication: none at bedside Disposition Plan: pending GI evaluation   Consultants:   GI  Nephrology ID  Urology  Orthopedics  Procedures:   Foley catheter d/c 2/20 Impression - No gross lesions in esophagus. - Purple-tinged gastric fluid. Lavaged - A single gastric polyp. Resected and retrieved via Mucosal resection. Clips (MR conditional) were placed to close defect. - No other gross lesions in the stomach. - No gross lesions in the duodenal bulb, in the first portion of the duodenum and in the second portion of the duodenum. Recommendation - The patient will  be observed post-procedure, until all discharge criteria are met. - Return patient to hospital ward for ongoing care. - Observe patient's clinical course. - Trend Hgb/Hct. - Soft diet x 72 hours. - PPI BID x 8-monthand then decrease to once daily. - Follow up in outpatient clinic to discuss consideration of colonoscopy for further workup of FOBT+ stool and AoCD. - The findings  and recommendations were discussed with the patient. - The findings and recommendations were discussed with the referring physician.  Antimicrobials:  Anti-infectives (From admission, onward)   Start     Dose/Rate Route Frequency Ordered Stop   01/17/19 2200  cefTRIAXone (ROCEPHIN) 1 g in sodium chloride 0.9 % 100 mL IVPB  Status:  Discontinued     1 g 200 mL/hr over 30 Minutes Intravenous Every 24 hours 01/17/19 0054 01/19/19 0914   01/16/19 2345  cefTRIAXone (ROCEPHIN) 1 g in sodium chloride 0.9 % 100 mL IVPB     1 g 200 mL/hr over 30 Minutes Intravenous  Once 01/16/19 2330 01/17/19 0055     Subjective: C/o persistent frequency and dysuria.  Unchanged since prior to stent.  Objective: Vitals:   01/28/19 1119 01/28/19 1130 01/28/19 1145 01/28/19 1212  BP: (!) 179/85 (!) 171/78 (!) 155/77 (!) 144/92  Pulse: 92 81 98 85  Resp: (!) _0 Temp:    97.6 F (36.4 C)  TempSrc:    Oral  SpO2: 96% 96% 92% 100%  Weight:      Height:        Intake/Output Summary (Last 24 hours) at 01/28/2019 1619 Last data filed at 01/28/2019 1545 Gross per 24 hour  Intake 120 ml  Output 475 ml  Net -355 ml   Filed Weights   01/26/19 1953 01/27/19 0500 01/28/19 0805  Weight: 84 kg 84 kg 84 kg    Examination:  General: No acute distress. Cardiovascular: Heart sounds show a regular rate, and rhythm Lungs: Clear to auscultation bilaterally Abdomen: Soft, nontender, nondistended Neurological: Alert and oriented 3. Moves all extremities 4. Cranial nerves II through XII grossly intact. Skin: Warm and dry. No rashes or lesions. Extremities: No clubbing or cyanosis. Trace edema.   Data Reviewed: I have personally reviewed following labs and imaging studies  CBC: Recent Labs  Lab 01/25/19 0802 01/25/19 2002 01/26/19 0621 01/27/19 0338 01/28/19 0401  WBC 11.4* 10.3 9.0 12.6* 9.4  HGB 7.3* 7.6* 6.4* 8.3* 7.4*  HCT 23.8* 24.5* 20.3* 26.1* 23.5*  MCV 92.6 93.5 94.4 91.6 91.8  PLT  332 302 297 329 3161  Basic Metabolic Panel: Recent Labs  Lab 01/24/19 0332 01/25/19 0522 01/26/19 0621 01/27/19 0338 01/28/19 0401  NA 133* 132* 134* 132* 131*  K 3.4* 3.6 3.7 4.0 3.7  CL 103 103 103 102 103  CO2 18* 20* 22 20* 21*  GLUCOSE 122* 138* 151* 139* 152*  BUN 36* 30* 29* 27* 25*  CREATININE 2.80* 2.54* 2.65* 2.55* 2.70*  CALCIUM 9.0 9.0 9.1 9.6 9.2  MG  --   --   --   --  1.7   GFR: Estimated Creatinine Clearance: 19.6 mL/min (A) (by C-G formula based on SCr of 2.7 mg/dL (H)). Liver Function Tests: Recent Labs  Lab 01/28/19 0401  AST 18  ALT 14  ALKPHOS 92  BILITOT 0.7  PROT 5.3*  ALBUMIN 2.4*   No results for input(s): LIPASE, AMYLASE in the last 168 hours. No results for input(s): AMMONIA in the last 168 hours. Coagulation Profile:  No results for input(s): INR, PROTIME in the last 168 hours. Cardiac Enzymes: No results for input(s): CKTOTAL, CKMB, CKMBINDEX, TROPONINI in the last 168 hours. BNP (last 3 results) No results for input(s): PROBNP in the last 8760 hours. HbA1C: No results for input(s): HGBA1C in the last 72 hours. CBG: Recent Labs  Lab 01/27/19 1635 01/27/19 2152 01/28/19 0748 01/28/19 0830 01/28/19 1204  GLUCAP 113* 145* 122* 115* 104*   Lipid Profile: No results for input(s): CHOL, HDL, LDLCALC, TRIG, CHOLHDL, LDLDIRECT in the last 72 hours. Thyroid Function Tests: No results for input(s): TSH, T4TOTAL, FREET4, T3FREE, THYROIDAB in the last 72 hours. Anemia Panel: No results for input(s): VITAMINB12, FOLATE, FERRITIN, TIBC, IRON, RETICCTPCT in the last 72 hours. Sepsis Labs: No results for input(s): PROCALCITON, LATICACIDVEN in the last 168 hours.  Recent Results (from the past 240 hour(s))  Culture, blood (Routine X 2) w Reflex to ID Panel     Status: None   Collection Time: 01/19/19 10:50 PM  Result Value Ref Range Status   Specimen Description BLOOD RIGHT HAND  Final   Special Requests   Final    BOTTLES DRAWN AEROBIC  ONLY Blood Culture adequate volume   Culture   Final    NO GROWTH 5 DAYS Performed at [facility], [address], [place], [place] [address]    Report Status 01/25/2019 FINAL  Final  Culture, blood (Routine X 2) w Reflex to ID Panel     Status: None   Collection Time: 01/19/19 10:50 PM  Result Value Ref Range Status   Specimen Description BLOOD RIGHT ANTECUBITAL  Final   Special Requests   Final    BOTTLES DRAWN AEROBIC AND ANAEROBIC Blood Culture adequate volume   Culture   Final    NO GROWTH 5 DAYS Performed at [facility], [place] [address] [place], [place] [address]    Report Status 01/25/2019 FINAL  Final  Body fluid culture     Status: None   Collection Time: 01/20/19 10:35 AM  Result Value Ref Range Status   Specimen Description SYNOVIAL LEFT KNEE  Final   Special Requests NONE  Final   Gram Stain   Final    RARE WBC PRESENT, PREDOMINANTLY PMN NO ORGANISMS SEEN Performed at [facility], [place] [address], [place], [place] [address]    Culture NO GROWTH  Final   Report Status 01/23/2019 FINAL  Final         Radiology Studies: No results found.      Scheduled Meds: . ferrous sulfate  325 mg Oral BID WC  . fesoterodine  4 mg Oral QHS  . fluticasone  1 spray Each Nare Daily  . folic acid  1 mg Oral Daily  . levothyroxine  75 mcg Oral QAC breakfast  . loratadine  10 mg Oral Daily  . mouth rinse  15 mL Mouth Rinse BID  . mirabegron ER  50 mg Oral Daily  . pantoprazole  40 mg Oral BID  . senna  1 tablet Oral Daily  . tamsulosin  0.4 mg Oral Daily  . vitamin B-12  1,000 mcg Oral Daily   Continuous Infusions:   LOS: 11 days    Time spent: over 30 min    Fayrene Helper, MD [facility] Pager AMION  If 7PM-7AM, please contact night-coverage [email or web address] Password [email or web address] 01/28/2019, 4:19 PM   PROGRESS NOTE    Erika Fry  DSK:[id number] DOB: 1946/09/19 DOA: 01/16/2019 PCP: Dagmar Hait,  Steva Ready, MD   Brief Narrative:  73  y.o.femalewith medical history significantfor stage IV CKD, hypertension, diabetes, L4-L5 discitis, with recent discharge from urology service who presented for poor per oral intake, pelvic pain, lethargy. Work up demonstrated BUN of 99, and creatinine of 6, with pyuria on UA and dysuria. Ceftriaxone was started and nephrology was consulted.  Acute kidney injury resolved.  Ongoing chronic pelvic pain.  ID consulted for FUO, extensive work-up thus far nonrevealing, fevers have resolved and ID signed off.  Hospital course complicated by progressive anemia, suspected slow GI bleed/FOBT +, transfused 1 unit  pRBC 2/25 and GI plans EGD 2/27.  Assessment & Plan:   Principal Problem:   Fever Active Problems:   Aortic stenosis   CKD (chronic kidney disease), stage IV (HCC)   Anemia of chronic disease   Benign essential HTN   Acute lower UTI   Pressure injury of skin   Fever:  -On admission due to UTI symptoms and positive UA, treated with ceftriaxone, urine cultures however were negative and completed 3 days course of ceftriaxone 2/15- 2/17.  -Due to ongoing fevers, ID was consulted and patient underwent extensive evaluation. -CRP 20, ESR 98, CT abdomen and pelvis without contrast 2/18: L4-5 discitis/osteomyelitis similar to prior lumbar MRI, probable gallstones without cholecystitis, right ureteral stent with mild right hydronephrosis and hydroureter. -Orthopedics performed left knee diagnostic arthrocentesis: Synovial fluid showed no crystals, WBCs 255, 61% neutrophils, rest lymphocytes and monocytes.  Synovial fluid culture negative, final. - MRI left knee 2/20: No discrete evidence of soft tissue infection about the knee, medial meniscus tear and tricompartmental osteoarthritis.  Left knee pain has improved after arthrocentesis and patient was able to ambulate in the room with the help of walker and PT supervision on 2/21. - Repeated blood cultures 2/18 x 2: Negative, final report. - Lumbar  spine MRI shows resolution of L4-5 discitis and osteomyelitis. - Had fever of 102.9 on night of 2/20 and has been afebrile since.  Monitoring off of antibiotics.  Fevers resolved.  ID signoff note 2/24 appreciated, initial fevers may be related to urological instrumentation/complicated UTI for which she had been on fosfomycin prior to admission.  Left knee pain Unclear etiology.  Management as noted above.  Acute pain seems to have resolved.  Ambulating better.  MRI with OA and meniscus tears.  AKI on stage IV CKD:  As per nephrology sign off 2/19, patient's recent baseline creatinine was progressing and was 5.3 prior to urological procedures.  She follows with Dr. Johnney Ou, Nephrology. -Acute kidney injury was felt due to dehydration and obstructive etiology/hydronephrosis. -Post IV fluid hydration and right ureteral stent placement and lithotripsy on 01/14/2019, creatinine has steadily improved.  Continue to follow BMP. -Outpatient follow-up with Dr. Johnney Ou, Nephrology will arrange. -Creatinine has fluctuated in the 2.5-2.8 for the last few days and probably new baseline and stable.  Continue to follow BMP closely (cr 2.55 today).  Foley catheter discontinued 2/20.  Off of IV fluids as well.  Acute kidney injury resolved.  NAGMA: continue to monitor  Acute metabolic encephalopathy: Possibly due to uremia vs. FUO.  -Resolved.  Anemia of chronic disease:  -Hemoglobin had been stable in the 7.5 to 8 g range for several days.  "Dark stools" were then reported by patient after several days history of constipation.  Due to stable hemoglobin and being on iron tablets, her dark stools were initially attributed to iron supplements and were considering elective colonoscopy as outpatient. -Subsequently however her hemoglobin gradually  drifted down to 6.4 on 2/25.  There is concern for slow GI bleed (see below). -S/p 1 unit PRBC on 2/25.   - GI c/s, appreciate recs - planning for EGD on 2/27  Benign  essential HTN:  - Continue home medications.  Mildly controlled at times.  Stage I pressure injury bilateral buttocks. Not known POA status.  - Offload as able.  Obesity/Body mass index is 32.57 kg/m. - Weight loss long term recommended.  Chronic pelvic pain - Status post cystoscopy, hydrodistention, right ureteral stent placement and lithotripsy by Dr. Louis Meckel, Urology on 01/14/2018.   - Had ongoing severe PV pain on 2/20 and patient stated "I want the stent out".  Previous provider discussed with Dr. Louis Meckel on 2/20, discontinued Foley catheter with improvement in vaginal pain, then having persistent dysuria-longstanding since before Thanksgiving, continued Myrbetriq, [place], treated constipation, discussed again with urology on 2/23, suspected possible stent related discomfort and recommended trial of tamsulosin but otherwise not many options.  Unable to use Pyridium due to chronic kidney disease. - Would prefer not to DC ureteral stent due to recent acute kidney injury and obstruction. - Outpatient follow-up with Dr. Louis Meckel  L4-5 discitis/osteomyelitis Has completed course of IV followed by oral antibiotics.  Lumbar spine MRI shows resolution.  DM2 A1c 6.3 on 04/30/2018.  Does not appear that she was on medications PTA/possibly diet controlled.  CBGs show good inpatient control.  GERD Due to concern for possible slow GI bleed, just [facility] has changed Pepcid to PPI.  Hypothyroid Continue Synthroid.  S/p PPM.  Hypokalemia: Replaced.  "Dark stools"/FOBT + Following bowel regimen , patient having "dark stools". Suspected initially due to iron supplements.  FOBT positive but hemoglobin was stable.  Had EGD June 2019 when she had a friable polyp removed but was unable to follow-up for colonoscopy. Then patient started to have gradually dropping hemoglobin and multiple loose BMs.  [facility] consulted. Concerned that she may be having recurrent oozing from  prior gastric polyp area and plan EGD 2/27. and do not think that she can tolerate colonoscopy at this time. As per GI, patient stools had not been dark at home despite iron supplements but now are dark.  Leukocytosis: mild, follow closely.  Afebrile.   DVT prophylaxis: SCD Code Status: (full Family Communication: none at bedside Disposition Plan: pending GI evaluation and possible CIR placement.     Consultants:   GI  Nephrology ID  Urology  Orthopedics  Procedures:   Foley catheter d/c 2/20  Antimicrobials:  Anti-infectives (From admission, onward)   Start     Dose/Rate Route Frequency Ordered Stop   01/17/19 2200  cefTRIAXone (ROCEPHIN) 1 g in sodium chloride 0.9 % 100 mL IVPB  Status:  Discontinued     1 g 200 mL/hr over 30 Minutes Intravenous Every 24 hours 01/17/19 0054 01/19/19 0914   01/16/19 2345  cefTRIAXone (ROCEPHIN) 1 g in sodium chloride 0.9 % 100 mL IVPB     1 g 200 mL/hr over 30 Minutes Intravenous  Once 01/16/19 2330 01/17/19 0055     Subjective: C/o persistent dysuria.  Frequency.   Was walking without assistive device prior to admission. Now requiring walker.  Objective: Vitals:   01/28/19 1119 01/28/19 1130 01/28/19 1145 01/28/19 1212  BP: (!) 179/85 (!) 171/78 (!) 155/77 (!) 144/92  Pulse: 92 81 98 85  Resp: (!) _0 Temp:    97.6 F (36.4 C)  TempSrc:    Oral  SpO2: 96% 96% 92% 100%  Weight:      Height:        Intake/Output Summary (Last 24 hours) at 01/28/2019 1619 Last data filed at 01/28/2019 1545 Gross per 24 hour  Intake 120 ml  Output 475 ml  Net -355 ml   Filed Weights   01/26/19 1953 01/27/19 0500 01/28/19 0805  Weight: 84 kg 84 kg 84 kg    Examination:  General exam: Appears calm and comfortable  Respiratory system: Clear to auscultation. Respiratory effort normal. Cardiovascular system: S1 & S2 heard, RRR.  Gastrointestinal system: Abdomen is nondistended, soft and nontender Central nervous system: Alert  and oriented. No focal neurological deficits. Extremities: trace LEE bilaterally Skin: No rashes, lesions or ulcers Psychiatry: Judgement and insight appear normal. Mood & affect appropriate.     Data Reviewed: I have personally reviewed following labs and imaging studies  CBC: Recent Labs  Lab 01/25/19 0802 01/25/19 2002 01/26/19 0621 01/27/19 0338 01/28/19 0401  WBC 11.4* 10.3 9.0 12.6* 9.4  HGB 7.3* 7.6* 6.4* 8.3* 7.4*  HCT 23.8* 24.5* 20.3* 26.1* 23.5*  MCV 92.6 93.5 94.4 91.6 91.8  PLT 332 302 297 329 161   Basic Metabolic Panel: Recent Labs  Lab 01/24/19 0332 01/25/19 0522 01/26/19 0621 01/27/19 0338 01/28/19 0401  NA 133* 132* 134* 132* 131*  K 3.4* 3.6 3.7 4.0 3.7  CL 103 103 103 102 103  CO2 18* 20* 22 20* 21*  GLUCOSE 122* 138* 151* 139* 152*  BUN 36* 30* 29* 27* 25*  CREATININE 2.80* 2.54* 2.65* 2.55* 2.70*  CALCIUM 9.0 9.0 9.1 9.6 9.2  MG  --   --   --   --  1.7   GFR: Estimated Creatinine Clearance: 19.6 mL/min (A) (by C-G formula based on SCr of 2.7 mg/dL (H)). Liver Function Tests: Recent Labs  Lab 01/28/19 0401  AST 18  ALT 14  ALKPHOS 92  BILITOT 0.7  PROT 5.3*  ALBUMIN 2.4*   No results for input(s): LIPASE, AMYLASE in the last 168 hours. No results for input(s): AMMONIA in the last 168 hours. Coagulation Profile: No results for input(s): INR, PROTIME in the last 168 hours. Cardiac Enzymes: No results for input(s): CKTOTAL, CKMB, CKMBINDEX, TROPONINI in the last 168 hours. BNP (last 3 results) No results for input(s): PROBNP in the last 8760 hours. HbA1C: No results for input(s): HGBA1C in the last 72 hours. CBG: Recent Labs  Lab 01/27/19 1635 01/27/19 2152 01/28/19 0748 01/28/19 0830 01/28/19 1204  GLUCAP 113* 145* 122* 115* 104*   Lipid Profile: No results for input(s): CHOL, HDL, LDLCALC, TRIG, CHOLHDL, LDLDIRECT in the last 72 hours. Thyroid Function Tests: No results for input(s): TSH, T4TOTAL, FREET4, T3FREE,  THYROIDAB in the last 72 hours. Anemia Panel: No results for input(s): VITAMINB12, FOLATE, FERRITIN, TIBC, IRON, RETICCTPCT in the last 72 hours. Sepsis Labs: No results for input(s): PROCALCITON, LATICACIDVEN in the last 168 hours.  Recent Results (from the past 240 hour(s))  Culture, blood (Routine X 2) w Reflex to ID Panel     Status: None   Collection Time: 01/19/19 10:50 PM  Result Value Ref Range Status   Specimen Description BLOOD RIGHT HAND  Final   Special Requests   Final    BOTTLES DRAWN AEROBIC ONLY Blood Culture adequate volume   Culture   Final    NO GROWTH 5 DAYS Performed at [facility], [address], [place], [place] [address]    Report Status  01/25/2019 FINAL  Final  Culture, blood (Routine X 2) w Reflex to ID Panel     Status: None   Collection Time: 01/19/19 10:50 PM  Result Value Ref Range Status   Specimen Description BLOOD RIGHT ANTECUBITAL  Final   Special Requests   Final    BOTTLES DRAWN AEROBIC AND ANAEROBIC Blood Culture adequate volume   Culture   Final    NO GROWTH 5 DAYS Performed at [facility], [place] [address], [place], [place] [address]    Report Status 01/25/2019 FINAL  Final  Body fluid culture     Status: None   Collection Time: 01/20/19 10:35 AM  Result Value Ref Range Status   Specimen Description SYNOVIAL LEFT KNEE  Final   Special Requests NONE  Final   Gram Stain   Final    RARE WBC PRESENT, PREDOMINANTLY PMN NO ORGANISMS SEEN Performed at [facility], [place] [address], [place], [place] [address]    Culture NO GROWTH  Final   Report Status 01/23/2019 FINAL  Final         Radiology Studies: No results found.      Scheduled Meds: . ferrous sulfate  325 mg Oral BID WC  . fesoterodine  4 mg Oral QHS  . fluticasone  1 spray Each Nare Daily  . folic acid  1 mg Oral Daily  . levothyroxine  75 mcg Oral QAC breakfast  . loratadine  10 mg Oral Daily  . mouth rinse  15 mL Mouth Rinse BID  . mirabegron ER   50 mg Oral Daily  . pantoprazole  40 mg Oral BID  . senna  1 tablet Oral Daily  . tamsulosin  0.4 mg Oral Daily  . vitamin B-12  1,000 mcg Oral Daily   Continuous Infusions:   LOS: 11 days    Time spent: over 30 min    Fayrene Helper, MD [facility] Pager AMION  If 7PM-7AM, please contact night-coverage [email or web address] Password [facility] 01/28/2019, 4:19 PM

## 2019-01-28 NOTE — Transfer of Care (Signed)
Immediate Anesthesia Transfer of Care Note  Patient: Erika Fry  Procedure(s) Performed: ESOPHAGOGASTRODUODENOSCOPY (EGD) WITH PROPOFOL (N/A ) HEMOSTASIS CONTROL HEMOSTASIS CLIP PLACEMENT POLYPECTOMY  Patient Location: Endoscopy Unit  Anesthesia Type:MAC  Level of Consciousness: awake, alert  and oriented  Airway & Oxygen Therapy: Patient Spontanous Breathing  Post-op Assessment: Report given to RN and Post -op Vital signs reviewed and stable  Post vital signs: Reviewed and stable  Last Vitals:  Vitals Value Taken Time  BP    Temp 36.4 C 01/28/2019 10:50 AM  Pulse    Resp    SpO2      Last Pain:  Vitals:   01/28/19 0805  TempSrc: Oral  PainSc: 4       Patients Stated Pain Goal: 4 (18/98/42 1031)  Complications: No apparent anesthesia complications

## 2019-01-28 NOTE — Progress Notes (Signed)
Patient reports left upp quandrant pain 8/10 follow EGD with polyp removal. Patient had submucosal epi injected at EMR site. IV Fentanyl given per Dr. Rush Landmark order. Dr. Rush Landmark at bedside at this time he assessed pain, plan is to continue to monitor pain after IV fentanyl.

## 2019-01-28 NOTE — Op Note (Signed)
[facility] Patient Name: Erika Fry Procedure Date : 01/28/2019 MRN: [id number] Attending MD: Justice Britain , MD Date of Birth: 09/14/1946 CSN: [id number] Age: 73 Admit Type: Inpatient Procedure:                Upper GI endoscopy Indications:              Occult blood in stool, Gastric polyps, For therapy                            of gastric polyps Providers:                Justice Britain, MD, Jeanella Cara, RN,                            Cherylynn Ridges, Technician, Edmonia James, CRNA Referring MD:             Carlota Raspberry. Havery Moros, MD, Pricilla Riffle. Fuller Plan, MD, Dr.                            Florene Glen ([facility]) Medicines:                Monitored Anesthesia Care Complications:            No immediate complications. Estimated Blood Loss:     Estimated blood loss: none. Procedure:                Pre-Anesthesia Assessment:                           - Prior to the procedure, a History and Physical                            was performed, and patient medications and                            allergies were reviewed. The patient's tolerance of                            previous anesthesia was also reviewed. The risks                            and benefits of the procedure and the sedation                            options and risks were discussed with the patient.                            All questions were answered, and informed consent                            was obtained. Prior Anticoagulants: The patient has                            taken no previous anticoagulant or antiplatelet  agents. ASA Grade Assessment: III - A patient with                            severe systemic disease. After reviewing the risks                            and benefits, the patient was deemed in                            satisfactory condition to undergo the procedure.                           After obtaining informed consent, the  endoscope was                            passed under direct vision. Throughout the                            procedure, the patient's blood pressure, pulse, and                            oxygen saturations were monitored continuously. The                            [id number]-[id number] ([id number] Olympus therapeutic                            gastroscope was introduced through the mouth, and                            advanced to the second part of duodenum. The upper                            GI endoscopy was accomplished without difficulty.                            The patient tolerated the procedure. Scope In: Scope Out: Findings:      No gross lesions were noted in the entire esophagus.      Purple-tinged fluid and a purple pill was found in the cardia, in the       gastric fundus and in the gastric body. Lavage of the area was performed       using copious amounts, resulting in clearance with adequate       visualization and the pill remained in the stomach.      A single, ulcerated, 15 mm pedunculated polyp with bleeding and stigmata       of recent bleeding was found in the prepyloric region of the stomach and       prolapsing into the duodenal bulb. Preparations were made for mucosal       resection. 3 mL of Epinephrine & [place] Orise gel was injected to raise       the lesion at the pedunculated stalk. Snare mucosal resection was       performed. Resection and retrieval were complete. To close the defect  after mucosal resection, three hemostatic clips were successfully placed       (MR conditional). There was no bleeding during, or at the end, of the       procedure.      No other gross lesions were noted in the entire examined stomach.      No gross lesions were noted in the duodenal bulb, in the first portion       of the duodenum and in the second portion of the duodenum. Impression:               - No gross lesions in esophagus.                           - Purple-tinged  gastric fluid. Lavaged                           - A single gastric polyp. Resected and retrieved                            via Mucosal resection. Clips (MR conditional) were                            placed to close defect.                           - No other gross lesions in the stomach.                           - No gross lesions in the duodenal bulb, in the                            first portion of the duodenum and in the second                            portion of the duodenum. Recommendation:           - The patient will be observed post-procedure,                            until all discharge criteria are met.                           - Return patient to hospital ward for ongoing care.                           - Observe patient's clinical course.                           - Trend Hgb/Hct.                           - Soft diet x 72 hours.                           - PPI BID x 106-monthand then decrease to once daily.                           -  Follow up in outpatient clinic to discuss                            consideration of colonoscopy for further workup of                            FOBT+ stool and AoCD.                           - The findings and recommendations were discussed                            with the patient.                           - The findings and recommendations were discussed                            with the referring physician. Procedure Code(s):        --- Professional ---                           [phone], Esophagogastroduodenoscopy, flexible,                            transoral; with endoscopic mucosal resection Diagnosis Code(s):        --- Professional ---                           K31.7, Polyp of stomach and duodenum                           R19.5, Other fecal abnormalities CPT copyright 2018 American [employer] Association. All rights reserved. The codes documented in this report are preliminary and upon coder review may  be revised to  meet current compliance requirements. Justice Britain, MD 01/28/2019 11:03:15 AM Number of Addenda: 0

## 2019-01-29 LAB — COMPREHENSIVE METABOLIC PANEL
ALT: 12 U/L (ref 0–44)
ANION GAP: 9 (ref 5–15)
AST: 12 U/L — ABNORMAL LOW (ref 15–41)
Albumin: 2.6 g/dL — ABNORMAL LOW (ref 3.5–5.0)
Alkaline Phosphatase: 96 U/L (ref 38–126)
BUN: 21 mg/dL (ref 8–23)
CALCIUM: 9.4 mg/dL (ref 8.9–10.3)
CO2: 19 mmol/L — ABNORMAL LOW (ref 22–32)
Chloride: 104 mmol/L (ref 98–111)
Creatinine, Ser: 2.54 mg/dL — ABNORMAL HIGH (ref 0.44–1.00)
GFR calc Af Amer: 21 mL/min — ABNORMAL LOW (ref >60)
GFR calc non Af Amer: 18 mL/min — ABNORMAL LOW (ref >60)
GLUCOSE: 111 mg/dL — AB (ref 70–99)
Potassium: 3.8 mmol/L (ref 3.5–5.1)
Sodium: 132 mmol/L — ABNORMAL LOW (ref 135–145)
Total Bilirubin: 0.6 mg/dL (ref 0.3–1.2)
Total Protein: 5.7 g/dL — ABNORMAL LOW (ref 6.5–8.1)

## 2019-01-29 LAB — GLUCOSE, CAPILLARY
Glucose-Capillary: 109 mg/dL — ABNORMAL HIGH (ref 70–99)
Glucose-Capillary: 108 mg/dL — ABNORMAL HIGH (ref 70–99)

## 2019-01-29 LAB — MAGNESIUM: Magnesium: 1.7 mg/dL (ref 1.7–2.4)

## 2019-01-29 LAB — CBC
HCT: 25 % — ABNORMAL LOW (ref 36.0–46.0)
Hemoglobin: 7.9 g/dL — ABNORMAL LOW (ref 12.0–15.0)
MCH: 28.5 pg (ref 26.0–34.0)
MCHC: 31.6 g/dL (ref 30.0–36.0)
MCV: 90.3 fL (ref 80.0–100.0)
Platelets: 335 10*3/uL (ref 150–400)
RBC: 2.77 MIL/uL — ABNORMAL LOW (ref 3.87–5.11)
RDW: 14.2 % (ref 11.5–15.5)
WBC: 9.5 10*3/uL (ref 4.0–10.5)
nRBC: 0 % (ref 0.0–0.2)

## 2019-01-29 MED ORDER — URELLE 81 MG PO TABS: 1.0000 | ORAL_TABLET | Freq: Four times a day (QID) | ORAL | 0 refills | Status: AC | PRN

## 2019-01-29 MED ORDER — PANTOPRAZOLE SODIUM 40 MG PO TBEC: 40.0000 mg | DELAYED_RELEASE_TABLET | Freq: Two times a day (BID) | ORAL | 0 refills | Status: DC

## 2019-01-29 MED ORDER — TAMSULOSIN HCL 0.4 MG PO CAPS: 0.4000 mg | ORAL_CAPSULE | Freq: Every day | ORAL | 0 refills | Status: AC

## 2019-01-29 MED ORDER — ALLOPURINOL 100 MG PO TABS: 50.0000 mg | ORAL_TABLET | Freq: Every day | ORAL | Status: DC

## 2019-01-29 NOTE — Progress Notes (Signed)
Physical Therapy Treatment Patient Details Name: Erika Fry MRN: [id number] DOB: 11-Jul-1946 Today's Date: 01/29/2019    History of Present Illness Pt admitted with UTI and fever; recent d/c from urology. PMH of lumbar spine osteomyelitis/discitis, CKD, HTN, DM, pacemaker.    PT Comments    Pt progressing towards physical therapy goals. Pt's main complaint is continued burning sensation during/after urination. Pt had difficulty getting comfortable in chair because of it. Pt able to perform transfers and ambulation with increased independence this session. Goal of session was to get dressed to prepare for d/c home. Mod-max assist provided to don pants and bra. Will continue to follow and progress as able per POC.     Follow Up Recommendations  Home health PT;Supervision/Assistance - 24 hour     Equipment Recommendations  None recommended by PT    Recommendations for Other Services       Precautions / Restrictions Precautions Precautions: Fall Precaution Comments: Poor safety awareness at times Restrictions Weight Bearing Restrictions: No    Mobility  Bed Mobility               General bed mobility comments: Pt was received sitting on BSC.  Transfers Overall transfer level: Needs assistance Equipment used: Rolling walker (2 wheeled) Transfers: Sit to/from Stand Sit to Stand: Min guard         General transfer comment: Close guard for safety as pt powered up to full stand. She was able to maintain standing balance while she performed her own peri-care  Ambulation/Gait Ambulation/Gait assistance: Min guard Gait Distance (Feet): 25 Feet Assistive device: Rolling walker (2 wheeled) Gait Pattern/deviations: Step-through pattern Gait velocity: Decreased Gait velocity interpretation: 1.31 - 2.62 ft/sec, indicative of limited community ambulator General Gait Details: Pt ambulating in room only at this time. Pt ambulated to sink and back to wash hands after  using BSC.    Stairs             Wheelchair Mobility    Modified Rankin (Stroke Patients Only)       Balance Overall balance assessment: Needs assistance Sitting-balance support: Bilateral upper extremity supported;Feet supported Sitting balance-Leahy Scale: Fair   Postural control: Right lateral lean Standing balance support: Bilateral upper extremity supported;During functional activity;Single extremity supported Standing balance-Leahy Scale: Poor Standing balance comment: reliance on the AD for dynamic activity                            Cognition Arousal/Alertness: Awake/alert Behavior During Therapy: WFL for tasks assessed/performed Overall Cognitive Status: Within Functional Limits for tasks assessed                                        Exercises      General Comments        Pertinent Vitals/Pain Pain Assessment: Faces Faces Pain Scale: Hurts whole lot Pain Location: Pt reports burning during and after urination Pain Descriptors / Indicators: Moaning;Burning;Grimacing Pain Intervention(s): Monitored during session    Home Living                      Prior Function            PT Goals (current goals can now be found in the care plan section) Acute Rehab PT Goals Patient Stated Goal: CIR instead of SNF PT Goal Formulation: With patient  Time For Goal Achievement: 02/02/19 Potential to Achieve Goals: Fair Progress towards PT goals: Progressing toward goals    Frequency    Min 3X/week      PT Plan Discharge plan needs to be updated    Co-evaluation              AM-PAC PT "6 Clicks" Mobility   Outcome Measure  Help needed turning from your back to your side while in a flat bed without using bedrails?: A Little Help needed moving from lying on your back to sitting on the side of a flat bed without using bedrails?: A Little Help needed moving to and from a bed to a chair (including a  wheelchair)?: A Little Help needed standing up from a chair using your arms (e.g., wheelchair or bedside chair)?: A Little Help needed to walk in hospital room?: A Little Help needed climbing 3-5 steps with a railing? : A Lot 6 Click Score: 17    End of Session Equipment Utilized During Treatment: Gait belt Activity Tolerance: Patient limited by fatigue Patient left: in chair;with call bell/phone within reach;with chair alarm set(MD present) Nurse Communication: Mobility status PT Visit Diagnosis: Other abnormalities of gait and mobility (R26.89);Muscle weakness (generalized) (M62.81);Pain Pain - Right/Left: Left Pain - part of body: Leg     Time: 1135-1156 PT Time Calculation (min) (ACUTE ONLY): 21 min  Charges:  $Gait Training: 8-22 mins                     Rolinda Roan, PT, DPT Acute Rehabilitation Services Pager: [phone] Office: [phone]    Thelma Comp 01/29/2019, 1:18 PM

## 2019-01-29 NOTE — Progress Notes (Signed)
Erika Fry to be D/C'd Home per MD order.  Discussed prescriptions and follow up appointments with the patient. Prescriptions given to patient, medication list explained in detail. Pt verbalized understanding.  Allergies as of 01/29/2019      Reactions   Baclofen Other (See Comments)   somnolence   Codeine Other (See Comments)   Increases Pain and couldn't sleep      Medication List    STOP taking these medications   famotidine 10 MG tablet Commonly known as:  PEPCID   furosemide 80 MG tablet Commonly known as:  LASIX   Insulin Glargine 100 UNIT/ML Solostar Pen Commonly known as:  LANTUS   Insulin Pen Needle 31G X 6 MM Misc   potassium chloride 10 MEQ tablet Commonly known as:  K-DUR     TAKE these medications   allopurinol 100 MG tablet Commonly known as:  ZYLOPRIM Take 0.5 tablets (50 mg total) by mouth daily.   Biotin 5 MG Caps Take 1 capsule (5 mg total) by mouth daily.   cetirizine 10 MG tablet Commonly known as:  ZYRTEC Take 10 mg by mouth at bedtime.   diazepam 10 MG tablet Commonly known as:  VALIUM 10 mg See admin instructions. Insert 10 mg vaginally at bedtime   ferrous sulfate 325 (65 FE) MG tablet Take 1 tablet (325 mg total) by mouth 2 (two) times daily with a meal.   fluticasone 50 MCG/ACT nasal spray Commonly known as:  FLONASE Place 2 sprays into both nostrils daily as needed for allergies or rhinitis.   folic acid 1 MG tablet Commonly known as:  FOLVITE Take 1 tablet (1 mg total) by mouth daily.   levothyroxine 75 MCG tablet Commonly known as:  SYNTHROID, LEVOTHROID Take 75 mcg by mouth daily before breakfast.   Lidocaine 5 % Crea Apply 1 application topically as needed (for pain).   MULTI-VITAMIN PO Take 1 tablet by mouth daily.   MYRBETRIQ 50 MG Tb24 tablet Generic drug:  mirabegron ER Take 50 mg by mouth daily.   oxycodone 5 MG capsule Commonly known as:  OXY-IR Take 1-2 capsules (5-10 mg total) by mouth every 4 (four)  hours as needed.   pantoprazole 40 MG tablet Commonly known as:  PROTONIX Take 1 tablet (40 mg total) by mouth 2 (two) times daily for 30 days. (take once daily after 1 month)   simvastatin 40 MG tablet Commonly known as:  ZOCOR Take 0.5 tablets (20 mg total) by mouth at bedtime. What changed:  how much to take   tamsulosin 0.4 MG Caps capsule Commonly known as:  FLOMAX Take 1 capsule (0.4 mg total) by mouth daily for 30 days.   TOVIAZ 4 MG Tb24 tablet Generic drug:  fesoterodine Take 4 mg by mouth at bedtime.   URELLE 81 MG Tabs tablet Take 1 tablet (81 mg total) by mouth every 6 (six) hours as needed for up to 10 days for bladder spasms.   vitamin B-12 1000 MCG tablet Commonly known as:  CYANOCOBALAMIN Take 1 tablet (1,000 mcg total) by mouth daily.   VITAMIN C PO Take 1 tablet by mouth 2 (two) times daily.       Vitals:   01/29/19 0336 01/29/19 0733  BP: (!) 150/81 (!) 163/101  Pulse: 82 81  Resp: (!) 21 18  Temp: (!) 97.5 F (36.4 C)   SpO2: 100% 100%    Skin clean, dry and intact without evidence of skin break down, no evidence of skin  tears noted. IV catheter discontinued intact. Site without signs and symptoms of complications. Dressing and pressure applied. Pt denies pain at this time. No complaints noted.  An After Visit Summary was printed and given to the patient. Patient escorted via [place], and D/C home via private auto.  Aneta Mins, RN

## 2019-01-29 NOTE — Progress Notes (Signed)
Daily Rounding Note  01/29/2019, 9:52 AM  LOS: 12 days   SUBJECTIVE:   Chief complaint: Gastric polyp.  Anemia of chronic disease.     Patient's complaint today is that of dysuria.  She has no nausea, no upper abdominal pain.  OBJECTIVE:         Vital signs in last 24 hours:    Temp:  [97.5 F (36.4 C)-98.6 F (37 C)] 97.5 F (36.4 C) (02/28 0336) Pulse Rate:  [81-98] 81 (02/28 0733) Resp:  [13-23] 18 (02/28 0733) BP: (142-179)/(69-101) 163/101 (02/28 0733) SpO2:  [92 %-100 %] 100 % (02/28 0733) Weight:  [83.8 kg] 83.8 kg (02/27 1952) Last BM Date: 01/27/19 Filed Weights   01/27/19 0500 01/28/19 0805 01/28/19 1952  Weight: 84 kg 84 kg 83.8 kg   General: Overweight.  Nontoxic.  Looks a bit uncomfortable. Heart: RRR. Chest: Clear bilaterally no labored breathing or cough. Abdomen: Soft.  Tenderness in the lower abdomen/pelvis.  No guarding or rebound.  Active bowel sounds.  Soft. Extremities: No CCE. Neuro/Psych: Oriented x3.  Good historian.  Moves all 4 limbs.  No tremor.  Intake/Output from previous day: 02/27 0701 - 02/28 0700 In: 540 [P.O.:540] Out: 800 [Urine:800]  Intake/Output this shift: No intake/output data recorded.  Lab Results: Recent Labs    01/27/19 0338 01/28/19 0401 01/28/19 1810 01/29/19 0451  WBC 12.6* 9.4  --  9.5  HGB 8.3* 7.4* 8.3* 7.9*  HCT 26.1* 23.5* 25.8* 25.0*  PLT 329 315  --  335   BMET Recent Labs    01/27/19 0338 01/28/19 0401 01/29/19 0451  NA 132* 131* 132*  K 4.0 3.7 3.8  CL 102 103 104  CO2 20* 21* 19*  GLUCOSE 139* 152* 111*  BUN 27* 25* 21  CREATININE 2.55* 2.70* 2.54*  CALCIUM 9.6 9.2 9.4   LFT Recent Labs    01/28/19 0401 01/29/19 0451  PROT 5.3* 5.7*  ALBUMIN 2.4* 2.6*  AST 18 12*  ALT 14 12  ALKPHOS 92 96  BILITOT 0.7 0.6   Scheduled Meds: . ferrous sulfate  325 mg Oral BID WC  . fesoterodine  4 mg Oral QHS  . fluticasone  1 spray  Each Nare Daily  . folic acid  1 mg Oral Daily  . levothyroxine  75 mcg Oral QAC breakfast  . loratadine  10 mg Oral Daily  . mouth rinse  15 mL Mouth Rinse BID  . mirabegron ER  50 mg Oral Daily  . pantoprazole  40 mg Oral BID  . senna  1 tablet Oral Daily  . tamsulosin  0.4 mg Oral Daily  . vitamin B-12  1,000 mcg Oral Daily   Continuous Infusions: PRN Meds:.acetaminophen **OR** acetaminophen, lidocaine, opium-belladonna, polyethylene glycol, URELLE   ASSESMENT:   *   Anemia, FOBT +.   Normocytic indices. EGD 05/2018 with friable HP polyp at pylorus. EGD 01/28/18 with EMR resection of gastric polyp, site clipped post resection.  This was not oozing blood but purple tinged gastric fluid noted.    *   Blood loss anemia.  S/p 1 U PRBC 2/25.  Hgb stable.  Low iron, low TIBC.  Ferritin 336.  On BID PO iron.   Hgb 6.4 >> 8.3 >> 7.4 >> 7.9.   *    Stage IV CKD.  This certainly contributing to anemia.  *   FUO, resolved.  History of discitis.  Dr. Graylon Good, ID, attributed fevers  to 01/14/19 urologic instrumentation, UTI.   PLAN   *  BID po PPI for 1 month, then drop dose to once daily. Regular diet.  Though attending and renal physicians may want her to be on a renal restricted diet at this point, will defer that decision to those MDs.  *    Consider outpatient colonoscopy to complete work-up of FOBT positive stool and anemia of chronic disease. Her GI doctor of record is Dr. Seaside Cellar as he was the first GI doctor to do consult on this patient in June 2019 when she presented as an unassigned patient 03/05/2019 appointment with him at [place] office.   *   Hold low-dose aspirin for 10 days.  *    Alerted patient that if she saw burgundy or bloody stools that she should alert her physician and probably had back to the ED since there is some risk of bleeding from the gastric polypectomy site.  The risk exists for the next couple of weeks.    Azucena Freed  01/29/2019, 9:52 AM Phone  [phone]

## 2019-01-29 NOTE — Anesthesia Postprocedure Evaluation (Signed)
Anesthesia Post Note  Patient: Erika Fry  Procedure(s) Performed: ESOPHAGOGASTRODUODENOSCOPY (EGD) WITH PROPOFOL (N/A ) HEMOSTASIS CONTROL HEMOSTASIS CLIP PLACEMENT POLYPECTOMY     Patient location during evaluation: Endoscopy Anesthesia Type: MAC Level of consciousness: awake and alert Pain management: pain level controlled Vital Signs Assessment: post-procedure vital signs reviewed and stable Respiratory status: spontaneous breathing, nonlabored ventilation, respiratory function stable and patient connected to nasal cannula oxygen Cardiovascular status: blood pressure returned to baseline and stable Postop Assessment: no apparent nausea or vomiting Anesthetic complications: no    Last Vitals:  Vitals:   01/29/19 0336 01/29/19 0733  BP: (!) 150/81 (!) 163/101  Pulse: 82 81  Resp: (!) 21 18  Temp: (!) 36.4 C   SpO2: 100% 100%    Last Pain:  Vitals:   01/29/19 0336  TempSrc: Oral  PainSc:                  Chelsey L Woodrum

## 2019-01-29 NOTE — Discharge Summary (Signed)
Physician Discharge Summary  Erika Fry YIA:[id number] DOB: 21-Jan-1946 DOA: 01/16/2019  PCP: [name], MD  Admit date: 01/16/2019 Discharge date: 01/29/2019  Time spent: 40 minutes  Recommendations for Outpatient Follow-up:  1. Follow up outpatient CBC/CMP 2. Follow up with ID as scheduled 3. Follow up with urology for chronic pelvic pain, dysuria/frequency 4. Follow up with GI as outpatient for biopsy and colonoscopy as outpatient 5. Follow up with orthopedics as outpatient 6. Follow up volume status/renal function as outpatient, currently holding lasix, determine whether this needs to be resumed or can continue to hold 7. Repeat CXR outpatient 8. PT recommended CIR, but peer to peer denied.  Pt declined SNF.  Discharged home with home health services.  Discharge Diagnoses:  Principal Problem:   Fever Active Problems:   Aortic stenosis   CKD (chronic kidney disease), stage IV (HCC)   Anemia of chronic disease   Benign essential HTN   Acute lower UTI   Pressure injury of skin   Discharge Condition: stable  Diet recommendation: heart healthy  Filed Weights   01/27/19 0500 01/28/19 0805 01/28/19 1952  Weight: 84 kg 84 kg 83.8 kg    History of present illness:  73 y.o.femalewith medical history significantfor stage IV CKD, hypertension, diabetes, L4-L5 discitis, with recent discharge from urology service who presented for poor per oral intake, pelvic pain, lethargy. Work up demonstrated BUN of 99, and creatinine of 6, with pyuria on UA and dysuria. Ceftriaxone was started and nephrology was consulted. Acute kidney injuryresolved. Ongoing chronic pelvic pain. ID consulted for FUO, extensive work-up thus far nonrevealing, fevers have resolved and ID signed off. Hospital course complicated by progressive anemia, suspected slow GI bleed/FOBT +, transfused 1 unit  pRBC 2/25.  Erika Fry had EGD on 2/27 with removal of gastric polyp.  Her hemoglobin was stable on 2/28 and  Erika Fry was discharged with plans for outpatient follow up.  See below for more detail  Hospital Course:  Fever:  -On admission due to UTI symptoms and positive UA, treated with ceftriaxone, urine cultures however were negative and completed 3 days course of ceftriaxone 2/15- 2/17.  -Due to ongoing fevers, ID was consulted and patient underwent extensive evaluation. -CRP 20, ESR 98, CT abdomen and pelvis without contrast 2/18: L4-5 discitis/osteomyelitis similar to prior lumbar MRI, probable gallstones without cholecystitis, right ureteral stent with mild right hydronephrosis and hydroureter. -Orthopedics performed left knee diagnostic arthrocentesis: Synovial fluid showed no crystals, WBCs 255, 61% neutrophils, rest lymphocytes and monocytes. Synovial fluid culture negative, final. - MRI left knee 2/20: No discrete evidence of soft tissue infection about the knee, medial meniscus tear and tricompartmental osteoarthritis. Left knee pain has improved after arthrocentesis and patient was able to ambulate in the room with the help of walker and PT supervision on 2/21. - Repeated blood cultures 2/18 x 2: Negative, final report. - Lumbar spine MRI shows resolution of L4-5 discitis and osteomyelitis. - Had fever of 102.9 on night of 2/20and has been afebrile since. Monitoring off of antibiotics. Fevers resolved. ID signoff note 2/24 appreciated, initial fevers may be related to urological instrumentation/complicated UTI for which Erika Fry had been on fosfomycin prior to admission.  Left knee pain Unclear etiology. Management as noted above.Acute pain seems to have resolved. Ambulating better.  MRI with OA and meniscus tears. Follow outpatient  AKI on stage IV CKD:  As per nephrology sign off 2/19, patient's recent baseline creatinine was progressing and was 5.3 prior to urological procedures. Erika Fry follows with Dr.  Kruska,Nephrology. -Acute kidney injury was felt due to dehydration and obstructive  etiology/hydronephrosis. -Post IV fluid hydration and right ureteral stent placement and lithotripsy on 01/14/2019, creatinine has steadily improved. Continue to follow BMP. -Outpatient follow-up with Dr. Evie Lacks will arrange. -Creatinine has fluctuated in the 2.5-2.8 for the last few days and probably new baseline and stable. Continue to follow BMP closely (cr 2.54 on day of discharge). Foley catheter discontinued 2/20. Off of IV fluids as well. Acute kidney injury resolved. - Holding lasix at discharge  [place]: continue to monitor  Acute metabolic encephalopathy:Possibly due to uremia vs. FUO.  -Resolved.  Anemia of chronic disease: -Hemoglobin had been stable in the 7.5 to 8 g range for several days. "Dark stools" were then reported by patient after several days history of constipation. Due to stable hemoglobin and being on iron tablets, her dark stools were initially attributed to iron supplements and were considering elective colonoscopy as outpatient. -Subsequently however her hemoglobin gradually drifted down to 6.4 on 2/25. There is concern for slow GI bleed (see below). -S/p 1 unit PRBC on 2/25.  - s/p EGD with GI on 2/27 with gastric polyp with stigmata of recent bleeding removed.  See below. - Follow biopsy. - Soft diet x 72 hours, BID PPI x 1 month, then daily, follow up in outpatient clinic to consider colonoscopy  Benign essential HTN:  - Continue home medications. Mildly controlled at times.  Stage I pressure injury bilateral buttocks. Not known POA status.  - Offload as able.  Obesity/Body mass index is 32.57 kg/m. - Weight loss long term recommended.  Chronic pelvic pain -Status post cystoscopy, hydrodistention, right ureteral stent placement and lithotripsy by Dr. Deeann Saint on 01/14/2018. -Had ongoing severe PV pain on 2/20 and patient stated "I want the stent out". Previous provider discussed with Dr. Louis Meckel on 2/20,  discontinued Foley catheter with improvement in vaginal pain, then having persistent dysuria-longstanding since before Thanksgiving, continued Myrbetriq, [place], treated constipation, discussed again with urology on 2/23 by previous provider, suspected possible stent related discomfort and recommended trial of tamsulosin but otherwise not many options. Unable to use Pyridium due to chronic kidney disease.  Discussed again with urology 2/27, B&O suppositories seemed to help.  On 2/28 discussed again with urology who recommended outpatient urology follow up. -Would prefer not to DC ureteral stent due to recent acute kidney injury and obstruction. -Outpatient follow-up with Dr. Louis Meckel  L4-5 discitis/osteomyelitis Has completed course of IV followed by oral antibiotics. Lumbar spine MRI shows resolution.  DM2 A1c 6.3 on 04/30/2018. Does not appear that Erika Fry was on medications PTA/possibly diet controlled. CBGs show good inpatient control.  GERD Due to concern for possible slow GI bleed, just [facility] has changed Pepcid to PPI.  Hypothyroid Continue Synthroid.  S/p PPM.  Hypokalemia: Replaced.  "Dark stools"/FOBT + Following bowel regimen , patient having "dark stools". Suspectedinitiallydue to iron supplements. FOBT positive but hemoglobin wasstable. Had EGD June 2019 when Erika Fry had a friable polyp removed but was unable to follow-up for colonoscopy. Then patient started to have gradually dropping hemoglobin and multiple loose BMs. [facility] consulted. Concerned that Erika Fry may be having recurrent oozing from prior gastric polyp area and plan EGD2/27. and do not think that Erika Fry can tolerate colonoscopy at this time. As per GI, patient stools had not been dark at home despite iron supplements but now are dark. - s/p EGD with GI on 2/27 with gastric polyp with stigmata of recent bleeding, removed.  See  below. - Follow biopsy. - Soft diet x 72 hours, BID PPI x 1 month,  then daily, follow up in outpatient clinic to consider colonoscopy  Leukocytosis: mild, follow closely.  Afebrile.   Procedures: EGD - No gross lesions in esophagus. - Purple-tinged gastric fluid. Lavaged - A single gastric polyp. Resected and retrieved via Mucosal resection. Clips (MR conditional) were placed to close defect. - No other gross lesions in the stomach. - No gross lesions in the duodenal bulb, in the first portion of the duodenum and in the second portion of the duodenum. Impression: - The patient will be observed post-procedure, until all discharge criteria are met. - Return patient to hospital ward for ongoing care. - Observe patient's clinical course. - Trend Hgb/Hct. - Soft diet x 72 hours. - PPI BID x 18-monthand then decrease to once daily. - Follow up in outpatient clinic to discuss consideration of colonoscopy for further workup of FOBT+ stool and AoCD. - The findings and recommendations were discussed with the patient. - The findings and recommendations were discussed with the referring physician.  Consultations:  GI  Nephrology  ID  Urology  Orthopedics  Discharge Exam: Vitals:   01/29/19 0336 01/29/19 0733  BP: (!) 150/81 (!) 163/101  Pulse: 82 81  Resp: (!) 21 18  Temp: (!) 97.5 F (36.4 C)   SpO2: 100% 100%   Still with pelvic pain, dysuria. Ready to go home.  General: No acute distress. Cardiovascular: Heart sounds show a regular rate, and rhythm.  Lungs: Clear to auscultation bilaterally Abdomen: Soft, nontender, nondistended  Neurological: Alert and oriented 3. Moves all extremities 4. Cranial nerves II through XII grossly intact. Skin: Warm and dry. No rashes or lesions. Extremities: No clubbing or cyanosis. No edema.   Discharge Instructions   Discharge Instructions    Diet - low sodium heart healthy   Complete by:  As directed    Discharge instructions   Complete by:  As directed    You were seen for fevers, acute  kidney injury, GI bleed, and chronic pelvic pain.  Your fevers have resolved.  You have follow up with ID in a few days.  We think your fever was because of the procedure with urology.  Your kidney function has improved.  Your lasix is currently on hold.  Please continue to hold this until you follow up with your PCP or kidney doctor.  You had an EGD performed due to dark stools and decreased hemoglobin.  You had a gastric polyp removed.  Please follow up the biopsy results with gastroenterology.  You also should follow up with GI as an outpatient for possible colonoscopy.  Continue protonix twice daily for 1 month, then transition to once daily.    Your left knee has osteoarthritis and meniscus tears.  Please follow up with orthopedics as an outpatient.  You had a stent placed with urology on 2/13.  Please follow up with urology, Dr. HLouis Meckelas an outpatient.  They should call you for an appointment, but if you don't hear from them within the next few days, call for an appointment.    Return for new, recurrent, or worsening symptoms.  Please ask your PCP to request records from this hospitalization so they know what was done and what the next steps will be.   Increase activity slowly   Complete by:  As directed      Allergies as of 01/29/2019      Reactions   Baclofen Other (See  Comments)   somnolence   Codeine Other (See Comments)   Increases Pain and couldn't sleep      Medication List    STOP taking these medications   famotidine 10 MG tablet Commonly known as:  PEPCID   furosemide 80 MG tablet Commonly known as:  LASIX   Insulin Glargine 100 UNIT/ML Solostar Pen Commonly known as:  LANTUS   Insulin Pen Needle 31G X 6 MM Misc   potassium chloride 10 MEQ tablet Commonly known as:  K-DUR     TAKE these medications   allopurinol 100 MG tablet Commonly known as:  ZYLOPRIM Take 0.5 tablets (50 mg total) by mouth daily.   Biotin 5 MG Caps Take 1 capsule (5 mg total) by  mouth daily.   cetirizine 10 MG tablet Commonly known as:  ZYRTEC Take 10 mg by mouth at bedtime.   diazepam 10 MG tablet Commonly known as:  VALIUM 10 mg See admin instructions. Insert 10 mg vaginally at bedtime   ferrous sulfate 325 (65 FE) MG tablet Take 1 tablet (325 mg total) by mouth 2 (two) times daily with a meal.   fluticasone 50 MCG/ACT nasal spray Commonly known as:  FLONASE Place 2 sprays into both nostrils daily as needed for allergies or rhinitis.   folic acid 1 MG tablet Commonly known as:  FOLVITE Take 1 tablet (1 mg total) by mouth daily.   levothyroxine 75 MCG tablet Commonly known as:  SYNTHROID, LEVOTHROID Take 75 mcg by mouth daily before breakfast.   Lidocaine 5 % Crea Apply 1 application topically as needed (for pain).   MULTI-VITAMIN PO Take 1 tablet by mouth daily.   MYRBETRIQ 50 MG Tb24 tablet Generic drug:  mirabegron ER Take 50 mg by mouth daily.   oxycodone 5 MG capsule Commonly known as:  OXY-IR Take 1-2 capsules (5-10 mg total) by mouth every 4 (four) hours as needed.   pantoprazole 40 MG tablet Commonly known as:  PROTONIX Take 1 tablet (40 mg total) by mouth 2 (two) times daily for 30 days. (take once daily after 1 month)   simvastatin 40 MG tablet Commonly known as:  ZOCOR Take 0.5 tablets (20 mg total) by mouth at bedtime. What changed:  how much to take   tamsulosin 0.4 MG Caps capsule Commonly known as:  FLOMAX Take 1 capsule (0.4 mg total) by mouth daily for 30 days.   TOVIAZ 4 MG Tb24 tablet Generic drug:  fesoterodine Take 4 mg by mouth at bedtime.   URELLE 81 MG Tabs tablet Take 1 tablet (81 mg total) by mouth every 6 (six) hours as needed for up to 10 days for bladder spasms.   vitamin B-12 1000 MCG tablet Commonly known as:  CYANOCOBALAMIN Take 1 tablet (1,000 mcg total) by mouth daily.   VITAMIN C PO Take 1 tablet by mouth 2 (two) times daily.      Allergies  Allergen Reactions  . Baclofen Other (See  Comments)    somnolence  . Codeine Other (See Comments)    Increases Pain and couldn't sleep   Follow-up Information    [facility] Follow up.   Specialty:  Home Health Services Why:  Register Nurse, Physical and Occupational Therapy, Nurse Aide and Social Worker Contact information: [place] [address] [phone]        Yetta Flock, MD Follow up on 03/05/2019.   Specialty:  Gastroenterology Why:  2:45 PM.  Follow-up with gastrointestinal  doctor. Contact information: [place] [address] [phone]        [name], MD Follow up.   Specialty:  Internal Medicine Contact information: [address] [place] [address] [phone]        Ardis Hughs, MD Follow up.   Specialty:  Urology Contact information: [place] [address] [phone]        Carlyle Basques, MD Follow up.   Specialty:  Infectious Diseases Contact information: [place] [address] [phone]            The results of significant diagnostics from this hospitalization (including imaging, microbiology, ancillary and laboratory) are listed below for reference.    Significant Diagnostic Studies: Ct Abdomen Pelvis Wo Contrast  Result Date: 01/19/2019 CLINICAL DATA:  Fever unknown origin EXAM: CT ABDOMEN AND PELVIS WITHOUT CONTRAST TECHNIQUE: Multidetector CT imaging of the abdomen and pelvis was performed following the standard protocol without IV contrast. COMPARISON:  CT abdomen pelvis 05/13/2018.  Lumbar MRI 08/31/2018 FINDINGS: Lower chest: Mild bibasilar atelectasis and small pleural effusions bilaterally. Pacemaker. Hepatobiliary: No focal liver lesion. Upper abdominal evaluation limited due to overlying arms and artifact. Gallbladder distended with layering sludge or small gallstones. No gallbladder wall thickening or biliary dilatation. Pancreas:  Negative Spleen: Negative Adrenals/Urinary Tract: Right ureteral stent in good position. Mild right hydronephrosis. Calculus in the right renal pelvis appears smaller compared with the prior study with interval lithotripsy noted. Stranding around the right kidney and ureter could represent infection. Correlate with urinalysis. 6 mm left upper pole calculus. No left-sided obstruction. Foley catheter in the bladder with the bladder empty. Stomach/Bowel: Negative for bowel obstruction. No bowel mass or edema. Vascular/Lymphatic: Mild atherosclerotic disease without aortic aneurysm. No mass or adenopathy. Reproductive: Normal uterus.  No pelvic mass. Other: Mild presacral edema. No pelvic fluid collection or abscess identified. Musculoskeletal: Disc space narrowing and endplate erosion at E3-1. Findings are similar to the prior MRI and consistent with discitis/osteomyelitis which may be chronic. IMPRESSION: : IMPRESSION: 1. Findings compatible with discitis/osteomyelitis at L4-5. Findings similar to the prior lumbar MRI. Repeat lumbar MRI without intravenous contrast given renal insufficiency recommended. 2. Probable gallstones without evidence of cholecystitis. 3. Right ureteral stent. Mild right hydronephrosis and hydroureter. Decreased right renal calculus following lithotripsy. Recommend correlation with urinalysis to rule out infection. Electronically Signed   By: Franchot Gallo M.D.   On: 01/19/2019 17:09   Dg Chest 1 View  Result Date: 01/16/2019 CLINICAL DATA:  Cystoscopy yesterday. Right upper quadrant pain with weakness and fatigue. Chronic kidney disease. EXAM: CHEST  1 VIEW COMPARISON:  06/15/2018 and 06/10/2018 FINDINGS: Patient slightly rotated to the left. Left-sided pacemaker unchanged. Lungs are adequately inflated with mild hazy density over the lateral left base which may be due in part to patient rotation although small left effusion with associated atelectasis is possible. Mild stable  cardiomegaly. Remainder of the exam is unchanged. IMPRESSION: Hazy density over the lateral left base likely partially due to patient rotation although small effusion with atelectasis is possible. Mild stable cardiomegaly. Electronically Signed   By: Marin Olp M.D.   On: 01/16/2019 21:49   Ct Head Wo Contrast  Result Date: 01/16/2019 CLINICAL DATA:  Encephalopathy. EXAM: CT HEAD WITHOUT CONTRAST TECHNIQUE: Contiguous axial images were obtained from the base of the skull through the vertex without intravenous contrast. COMPARISON:  05/28/2018 FINDINGS: Brain: Ventricles, cisterns and other CSF spaces are within normal.  There is no mass, mass effect, shift of midline structures or acute hemorrhage. No evidence of acute infarction. Vascular: No hyperdense vessel or unexpected calcification. Skull: Normal. Negative for fracture or focal lesion. Sinuses/Orbits: No acute finding. Other: None. IMPRESSION: No acute findings. Electronically Signed   By: Marin Olp M.D.   On: 01/16/2019 22:31   Mr Lumbar Spine Wo Contrast  Result Date: 01/22/2019 CLINICAL DATA:  History of discitis/osteomyelitis at L4-5. EXAM: MRI LUMBAR SPINE WITHOUT CONTRAST TECHNIQUE: Multiplanar, multisequence MR imaging of the lumbar spine was performed. No intravenous contrast was administered. COMPARISON:  MRI lumbar spine since 05/13/2018 and 08/31/2018 FINDINGS: Examination is somewhat degraded by motion artifact, particularly the axial images. Segmentation: There are five lumbar type vertebral bodies. The last full intervertebral disc space is labeled L5-S1. This correlates with the prior MRIs. Alignment:  Normal Vertebrae: Resolution of the diskitis and osteomyelitis at L4-5. No residual marrow edema or paraspinal process. No findings for epidural abscess. Chronic endplate reactive changes at L4-5. Conus medullaris and cauda equina: Conus extends to the L1 level. Conus and cauda equina appear normal. Paraspinal and other soft  tissues: No signal paraspinal findings. No psoas muscle abscess or myositis. Persistent extrarenal pelvis on the right side. No adenopathy or aortic aneurysm. Disc levels: T12-L1: Stable small left paracentral disc extrusion with minimal impression on the left side of thecal sac. L1-2: Diffuse annular bulge and osteophytic ridging with mild bilateral lateral recess encroachment. There is also a persistent broad-based right foraminal, extraforaminal and far lateral disc protrusion. Possibly irritating the right L1 nerve root. L2-3: Diffuse bulging annulus with mild bilateral lateral recess stenosis, left greater than right. There is also mild left foraminal stenosis. L3-4: Diffuse bulging annulus and mild facet disease contributing to moderate bilateral lateral recess stenosis. Small focal extraforaminal disc protrusion on the right comes close to contacting the right L3 nerve root. This appears stable. L4-5: Collapsed disc space related to prior diskitis and osteomyelitis. No findings for residual infection or inflammation. Degenerated bulging annulus but no spinal or significant lateral recess stenosis. There is mild persistent foraminal and extraforaminal encroachment on the right L4 nerve root. Moderate facet disease. L5-S1: No significant findings. IMPRESSION: 1. Resolution of diskitis and osteomyelitis at L4-5. 2. Stable multilevel disc protrusions with lateral recess and foraminal stenosis as detailed above. No new or progressive findings. Electronically Signed   By: Marijo Sanes M.D.   On: 01/22/2019 11:32   US Renal  Result Date: 01/18/2019 CLINICAL DATA:  Acute renal injury EXAM: RENAL / URINARY TRACT ULTRASOUND COMPLETE COMPARISON:  CT AP 05/13/2018 FINDINGS: Right Kidney: Renal measurements: 11.6 x 6.5 x 6.1 cm = volume: 237 mL. Increased echogenicity. No obstructive uropathy or mass. Left Kidney: Renal measurements: 9.6 x 4.8 x 4.5 cm = volume: 108 mL. Increased echogenicity. No obstructive uropathy  or mass. Bladder: Decompressed by Foley catheter. IMPRESSION: 1. Increased renal echogenicity which can be seen in medical renal disease. 2. No obstructive uropathy or renal mass. 3. Decompressed urinary bladder with Foley. Electronically Signed   By: Ashley Royalty M.D.   On: 01/18/2019 03:06   Mr Knee Left Wo Contrast  Result Date: 01/21/2019 CLINICAL DATA:  Fever of unknown origin. Severe knee pain. Negative left knee aspiration yesterday. EXAM: MRI OF THE LEFT KNEE WITHOUT CONTRAST TECHNIQUE: Multiplanar, multisequence MR imaging of the knee was performed. No intravenous contrast was administered. COMPARISON:  Left knee x-rays dated January 20, 2019. FINDINGS: MENISCI Medial meniscus: Longitudinal tear of the posterior horn.  Radial tear of the body. Lateral meniscus:  Intact. LIGAMENTS Cruciates:  Intact ACL and PCL. Collaterals: Medial collateral ligament is intact. Lateral collateral ligament complex is intact. CARTILAGE Patellofemoral: Diffuse moderate to high-grade partial-thickness cartilage loss. Medial: Large areas of full-thickness cartilage loss over the weight-bearing medial femoral condyle and medial tibial plateau. Lateral: Partial-thickness cartilage loss along the mesial aspect of the lateral compartment. Joint: Small joint effusion. Normal Hoffa's fat. No plical thickening. Popliteal Fossa:  No Baker cyst. Intact popliteus tendon. Extensor Mechanism: Intact quadriceps tendon and patellar tendon. Intact medial and lateral patellar retinaculum. Intact MPFL. Bones: No acute fracture or dislocation. No focal bone lesion. Tricompartmental osteophytes. Degenerative subchondral marrow edema in the peripheral medial femoral condyle and medial tibial plateau. Other: Mild soft tissue swelling about the knee. No fluid collection. No muscle edema. IMPRESSION: 1. No discrete evidence of soft tissue infection about the knee. 2. Longitudinal tear of the medial meniscus posterior horn. Radial tear of the medial  meniscus body. 3. Tricompartmental osteoarthritis, moderate in the medial and patellofemoral compartments. Electronically Signed   By: Titus Dubin M.D.   On: 01/21/2019 15:23   Dg Knee Complete 4 Views Left  Result Date: 01/20/2019 CLINICAL DATA:  Severe LEFT knee pain for awhile, fluid drained from LEFT knee this morning, persistent pain especially with any movement, no known injury EXAM: LEFT KNEE - COMPLETE 4+ VIEW COMPARISON:  None FINDINGS: Osseous demineralization. Joint space narrowing and spur formation greatest at medial compartment. No acute fracture, dislocation, or bone destruction. Scattered soft tissue swelling. Small metallic foreign body at the medial soft tissues of the distal thigh. No significant joint effusion. IMPRESSION: Osteoarthritic changes and osseous demineralization of the LEFT knee. No acute abnormalities. Electronically Signed   By: Lavonia Dana M.D.   On: 01/20/2019 16:15   Dg C-arm 1-60 Min-no Report  Result Date: 01/14/2019 Fluoroscopy was utilized by the requesting physician.  No radiographic interpretation.    Microbiology: Recent Results (from the past 240 hour(s))  Culture, blood (Routine X 2) w Reflex to ID Panel     Status: None   Collection Time: 01/19/19 10:50 PM  Result Value Ref Range Status   Specimen Description BLOOD RIGHT HAND  Final   Special Requests   Final    BOTTLES DRAWN AEROBIC ONLY Blood Culture adequate volume   Culture   Final    NO GROWTH 5 DAYS Performed at [facility], [address], [place], [place] [address]    Report Status 01/25/2019 FINAL  Final  Culture, blood (Routine X 2) w Reflex to ID Panel     Status: None   Collection Time: 01/19/19 10:50 PM  Result Value Ref Range Status   Specimen Description BLOOD RIGHT ANTECUBITAL  Final   Special Requests   Final    BOTTLES DRAWN AEROBIC AND ANAEROBIC Blood Culture adequate volume   Culture   Final    NO GROWTH 5 DAYS Performed at [facility], [place]  [address], [place], [place] [address]    Report Status 01/25/2019 FINAL  Final  Body fluid culture     Status: None   Collection Time: 01/20/19 10:35 AM  Result Value Ref Range Status   Specimen Description SYNOVIAL LEFT KNEE  Final   Special Requests NONE  Final   Gram Stain   Final    RARE WBC PRESENT, PREDOMINANTLY PMN NO ORGANISMS SEEN Performed at [facility], [place] [address], [place], [place] [address]  Culture NO GROWTH  Final   Report Status 01/23/2019 FINAL  Final     Labs: Basic Metabolic Panel: Recent Labs  Lab 01/25/19 0522 01/26/19 0621 01/27/19 0338 01/28/19 0401 01/29/19 0451  NA 132* 134* 132* 131* 132*  K 3.6 3.7 4.0 3.7 3.8  CL 103 103 102 103 104  CO2 20* 22 20* 21* 19*  GLUCOSE 138* 151* 139* 152* 111*  BUN 30* 29* 27* 25* 21  CREATININE 2.54* 2.65* 2.55* 2.70* 2.54*  CALCIUM 9.0 9.1 9.6 9.2 9.4  MG  --   --   --  1.7 1.7   Liver Function Tests: Recent Labs  Lab 01/28/19 0401 01/29/19 0451  AST 18 12*  ALT 14 12  ALKPHOS 92 96  BILITOT 0.7 0.6  PROT 5.3* 5.7*  ALBUMIN 2.4* 2.6*   No results for input(s): LIPASE, AMYLASE in the last 168 hours. No results for input(s): AMMONIA in the last 168 hours. CBC: Recent Labs  Lab 01/25/19 2002 01/26/19 8909 01/27/19 0338 01/28/19 0401 01/28/19 1810 01/29/19 0451  WBC 10.3 9.0 12.6* 9.4  --  9.5  HGB 7.6* 6.4* 8.3* 7.4* 8.3* 7.9*  HCT 24.5* 20.3* 26.1* 23.5* 25.8* 25.0*  MCV 93.5 94.4 91.6 91.8  --  90.3  PLT 302 297 329 315  --  335   Cardiac Enzymes: No results for input(s): CKTOTAL, CKMB, CKMBINDEX, TROPONINI in the last 168 hours. BNP: BNP (last 3 results) Recent Labs    06/07/18 0227  BNP 228.6*    ProBNP (last 3 results) No results for input(s): PROBNP in the last 8760 hours.  CBG: Recent Labs  Lab 01/28/19 0830 01/28/19 1204 01/28/19 1654 01/29/19 0731 01/29/19 1115  GLUCAP 115* 104* 90 109* 108*       Signed:  Fayrene Helper MD.  [facility] 01/29/2019, 7:00 PM

## 2019-01-31 ENCOUNTER — Encounter (HOSPITAL_COMMUNITY): Payer: Self-pay | Admitting: Gastroenterology

## 2019-02-01 ENCOUNTER — Ambulatory Visit: Payer: Medicare Other | Admitting: Internal Medicine

## 2019-02-01 ENCOUNTER — Encounter: Payer: Self-pay | Admitting: Gastroenterology

## 2019-02-02 ENCOUNTER — Ambulatory Visit: Payer: Medicare Other | Admitting: Internal Medicine

## 2019-02-10 ENCOUNTER — Emergency Department (HOSPITAL_COMMUNITY): Payer: Medicare Other

## 2019-02-10 ENCOUNTER — Other Ambulatory Visit: Payer: Self-pay

## 2019-02-10 ENCOUNTER — Telehealth: Payer: Self-pay | Admitting: Behavioral Health

## 2019-02-10 ENCOUNTER — Inpatient Hospital Stay (HOSPITAL_COMMUNITY)
Admission: EM | Admit: 2019-02-10 | Discharge: 2019-02-19 | DRG: 682 | Disposition: A | Discharge status: Home / Self Care | Payer: Medicare Other | Attending: Internal Medicine | Admitting: Internal Medicine

## 2019-02-10 ENCOUNTER — Encounter (HOSPITAL_COMMUNITY): Payer: Self-pay

## 2019-02-10 DIAGNOSIS — N390 Urinary tract infection, site not specified: Secondary | ICD-10-CM | POA: Diagnosis present

## 2019-02-10 DIAGNOSIS — E039 Hypothyroidism, unspecified: Secondary | ICD-10-CM | POA: Diagnosis present

## 2019-02-10 DIAGNOSIS — L03116 Cellulitis of left lower limb: Secondary | ICD-10-CM | POA: Diagnosis present

## 2019-02-10 DIAGNOSIS — B9689 Other specified bacterial agents as the cause of diseases classified elsewhere: Secondary | ICD-10-CM | POA: Diagnosis present

## 2019-02-10 DIAGNOSIS — B952 Enterococcus as the cause of diseases classified elsewhere: Secondary | ICD-10-CM | POA: Diagnosis present

## 2019-02-10 DIAGNOSIS — I130 Hypertensive heart and chronic kidney disease with heart failure and stage 1 through stage 4 chronic kidney disease, or unspecified chronic kidney disease: Secondary | ICD-10-CM | POA: Diagnosis present

## 2019-02-10 DIAGNOSIS — Z833 Family history of diabetes mellitus: Secondary | ICD-10-CM

## 2019-02-10 DIAGNOSIS — E872 Acidosis: Secondary | ICD-10-CM | POA: Diagnosis present

## 2019-02-10 DIAGNOSIS — K219 Gastro-esophageal reflux disease without esophagitis: Secondary | ICD-10-CM | POA: Diagnosis present

## 2019-02-10 DIAGNOSIS — E119 Type 2 diabetes mellitus without complications: Secondary | ICD-10-CM

## 2019-02-10 DIAGNOSIS — E034 Atrophy of thyroid (acquired): Secondary | ICD-10-CM | POA: Diagnosis not present

## 2019-02-10 DIAGNOSIS — N184 Chronic kidney disease, stage 4 (severe): Secondary | ICD-10-CM | POA: Diagnosis not present

## 2019-02-10 DIAGNOSIS — G8929 Other chronic pain: Secondary | ICD-10-CM | POA: Diagnosis present

## 2019-02-10 DIAGNOSIS — L89322 Pressure ulcer of left buttock, stage 2: Secondary | ICD-10-CM | POA: Diagnosis present

## 2019-02-10 DIAGNOSIS — N3010 Interstitial cystitis (chronic) without hematuria: Secondary | ICD-10-CM | POA: Diagnosis present

## 2019-02-10 DIAGNOSIS — M109 Gout, unspecified: Secondary | ICD-10-CM | POA: Diagnosis present

## 2019-02-10 DIAGNOSIS — I313 Pericardial effusion (noninflammatory): Secondary | ICD-10-CM | POA: Diagnosis present

## 2019-02-10 DIAGNOSIS — Z8719 Personal history of other diseases of the digestive system: Secondary | ICD-10-CM

## 2019-02-10 DIAGNOSIS — N179 Acute kidney failure, unspecified: Principal | ICD-10-CM | POA: Diagnosis present

## 2019-02-10 DIAGNOSIS — Z8249 Family history of ischemic heart disease and other diseases of the circulatory system: Secondary | ICD-10-CM

## 2019-02-10 DIAGNOSIS — E785 Hyperlipidemia, unspecified: Secondary | ICD-10-CM | POA: Diagnosis present

## 2019-02-10 DIAGNOSIS — I5033 Acute on chronic diastolic (congestive) heart failure: Secondary | ICD-10-CM | POA: Diagnosis present

## 2019-02-10 DIAGNOSIS — L89312 Pressure ulcer of right buttock, stage 2: Secondary | ICD-10-CM | POA: Diagnosis present

## 2019-02-10 DIAGNOSIS — L89892 Pressure ulcer of other site, stage 2: Secondary | ICD-10-CM | POA: Diagnosis present

## 2019-02-10 DIAGNOSIS — Z79890 Hormone replacement therapy (postmenopausal): Secondary | ICD-10-CM

## 2019-02-10 DIAGNOSIS — E876 Hypokalemia: Secondary | ICD-10-CM | POA: Diagnosis not present

## 2019-02-10 DIAGNOSIS — D509 Iron deficiency anemia, unspecified: Secondary | ICD-10-CM | POA: Diagnosis present

## 2019-02-10 DIAGNOSIS — K5900 Constipation, unspecified: Secondary | ICD-10-CM | POA: Diagnosis not present

## 2019-02-10 DIAGNOSIS — N132 Hydronephrosis with renal and ureteral calculous obstruction: Secondary | ICD-10-CM | POA: Diagnosis present

## 2019-02-10 DIAGNOSIS — N3281 Overactive bladder: Secondary | ICD-10-CM | POA: Diagnosis present

## 2019-02-10 DIAGNOSIS — E1122 Type 2 diabetes mellitus with diabetic chronic kidney disease: Secondary | ICD-10-CM | POA: Diagnosis present

## 2019-02-10 DIAGNOSIS — Z881 Allergy status to other antibiotic agents status: Secondary | ICD-10-CM

## 2019-02-10 DIAGNOSIS — D631 Anemia in chronic kidney disease: Secondary | ICD-10-CM | POA: Diagnosis present

## 2019-02-10 DIAGNOSIS — L03115 Cellulitis of right lower limb: Secondary | ICD-10-CM | POA: Diagnosis present

## 2019-02-10 DIAGNOSIS — I10 Essential (primary) hypertension: Secondary | ICD-10-CM | POA: Diagnosis present

## 2019-02-10 DIAGNOSIS — Z800 Family history of malignant neoplasm of digestive organs: Secondary | ICD-10-CM

## 2019-02-10 DIAGNOSIS — D491 Neoplasm of unspecified behavior of respiratory system: Secondary | ICD-10-CM

## 2019-02-10 DIAGNOSIS — I340 Nonrheumatic mitral (valve) insufficiency: Secondary | ICD-10-CM | POA: Diagnosis not present

## 2019-02-10 DIAGNOSIS — R609 Edema, unspecified: Secondary | ICD-10-CM | POA: Diagnosis not present

## 2019-02-10 DIAGNOSIS — K8070 Calculus of gallbladder and bile duct without cholecystitis without obstruction: Secondary | ICD-10-CM | POA: Diagnosis present

## 2019-02-10 DIAGNOSIS — E669 Obesity, unspecified: Secondary | ICD-10-CM | POA: Diagnosis present

## 2019-02-10 DIAGNOSIS — Z950 Presence of cardiac pacemaker: Secondary | ICD-10-CM

## 2019-02-10 DIAGNOSIS — Z885 Allergy status to narcotic agent status: Secondary | ICD-10-CM

## 2019-02-10 DIAGNOSIS — R918 Other nonspecific abnormal finding of lung field: Secondary | ICD-10-CM | POA: Diagnosis present

## 2019-02-10 DIAGNOSIS — Z87442 Personal history of urinary calculi: Secondary | ICD-10-CM

## 2019-02-10 DIAGNOSIS — Z79899 Other long term (current) drug therapy: Secondary | ICD-10-CM

## 2019-02-10 DIAGNOSIS — E7800 Pure hypercholesterolemia, unspecified: Secondary | ICD-10-CM | POA: Diagnosis present

## 2019-02-10 DIAGNOSIS — R109 Unspecified abdominal pain: Secondary | ICD-10-CM

## 2019-02-10 DIAGNOSIS — Z6832 Body mass index (BMI) 32.0-32.9, adult: Secondary | ICD-10-CM

## 2019-02-10 LAB — CBC WITH DIFFERENTIAL/PLATELET
Abs Immature Granulocytes: 0.02 10*3/uL (ref 0.00–0.07)
Basophils Absolute: 0.1 10*3/uL (ref 0.0–0.1)
Basophils Relative: 1 %
Eosinophils Absolute: 0.3 10*3/uL (ref 0.0–0.5)
Eosinophils Relative: 6 %
HCT: 24.8 % — ABNORMAL LOW (ref 36.0–46.0)
Hemoglobin: 7.9 g/dL — ABNORMAL LOW (ref 12.0–15.0)
Immature Granulocytes: 0 %
Lymphocytes Relative: 21 %
Lymphs Abs: 1.1 10*3/uL (ref 0.7–4.0)
MCH: 28.9 pg (ref 26.0–34.0)
MCHC: 31.9 g/dL (ref 30.0–36.0)
MCV: 90.8 fL (ref 80.0–100.0)
Monocytes Absolute: 0.7 10*3/uL (ref 0.1–1.0)
Monocytes Relative: 13 %
Neutro Abs: 3 10*3/uL (ref 1.7–7.7)
Neutrophils Relative %: 59 %
Platelets: 218 10*3/uL (ref 150–400)
RBC: 2.73 MIL/uL — ABNORMAL LOW (ref 3.87–5.11)
RDW: 14.2 % (ref 11.5–15.5)
WBC: 5.1 10*3/uL (ref 4.0–10.5)
nRBC: 0 % (ref 0.0–0.2)

## 2019-02-10 LAB — URINALYSIS, ROUTINE W REFLEX MICROSCOPIC
Bilirubin Urine: NEGATIVE
GLUCOSE, UA: NEGATIVE mg/dL
Ketones, ur: NEGATIVE mg/dL
Nitrite: NEGATIVE
Protein, ur: 30 mg/dL — AB
Specific Gravity, Urine: 1.008 (ref 1.005–1.030)
WBC, UA: >50 WBC/hpf — ABNORMAL HIGH (ref 0–5)
pH: 5 (ref 5.0–8.0)

## 2019-02-10 LAB — COMPREHENSIVE METABOLIC PANEL
ALK PHOS: 101 U/L (ref 38–126)
ALT: 8 U/L (ref 0–44)
AST: 13 U/L — ABNORMAL LOW (ref 15–41)
Albumin: 3.3 g/dL — ABNORMAL LOW (ref 3.5–5.0)
Anion gap: 12 (ref 5–15)
BUN: 64 mg/dL — ABNORMAL HIGH (ref 8–23)
CALCIUM: 10 mg/dL (ref 8.9–10.3)
CO2: 18 mmol/L — AB (ref 22–32)
Chloride: 103 mmol/L (ref 98–111)
Creatinine, Ser: 4.22 mg/dL — ABNORMAL HIGH (ref 0.44–1.00)
GFR calc non Af Amer: 10 mL/min — ABNORMAL LOW (ref >60)
GFR, EST AFRICAN AMERICAN: 11 mL/min — AB (ref >60)
Glucose, Bld: 115 mg/dL — ABNORMAL HIGH (ref 70–99)
Potassium: 3.5 mmol/L (ref 3.5–5.1)
Sodium: 133 mmol/L — ABNORMAL LOW (ref 135–145)
Total Bilirubin: 0.5 mg/dL (ref 0.3–1.2)
Total Protein: 6.4 g/dL — ABNORMAL LOW (ref 6.5–8.1)

## 2019-02-10 LAB — BRAIN NATRIURETIC PEPTIDE: B Natriuretic Peptide: 70.3 pg/mL (ref 0.0–100.0)

## 2019-02-10 MED ORDER — MIRABEGRON ER 50 MG PO TB24
50.0000 mg | ORAL_TABLET | Freq: Every day | ORAL | Status: DC
  Administered 2019-02-11 – 2019-02-19 (×9): 50 mg via ORAL
  Filled 2019-02-10 (×9): qty 1

## 2019-02-10 MED ORDER — PANTOPRAZOLE SODIUM 40 MG PO TBEC
40.0000 mg | DELAYED_RELEASE_TABLET | Freq: Two times a day (BID) | ORAL | Status: DC
  Administered 2019-02-11 – 2019-02-19 (×18): 40 mg via ORAL
  Filled 2019-02-10 (×18): qty 1

## 2019-02-10 MED ORDER — ONDANSETRON HCL 4 MG/2ML IJ SOLN: 4.0000 mg | Freq: Four times a day (QID) | INTRAMUSCULAR | Status: DC | PRN

## 2019-02-10 MED ORDER — FESOTERODINE FUMARATE ER 4 MG PO TB24
4.0000 mg | ORAL_TABLET | Freq: Every day | ORAL | Status: DC
  Administered 2019-02-11: 4 mg via ORAL
  Filled 2019-02-10: qty 1

## 2019-02-10 MED ORDER — SIMVASTATIN 20 MG PO TABS
40.0000 mg | ORAL_TABLET | Freq: Every day | ORAL | Status: DC
  Administered 2019-02-11 – 2019-02-18 (×8): 40 mg via ORAL
  Filled 2019-02-10 (×8): qty 2

## 2019-02-10 MED ORDER — FERROUS SULFATE 325 (65 FE) MG PO TABS
325.0000 mg | ORAL_TABLET | Freq: Two times a day (BID) | ORAL | Status: DC
  Administered 2019-02-11 – 2019-02-14 (×8): 325 mg via ORAL
  Filled 2019-02-10 (×8): qty 1

## 2019-02-10 MED ORDER — HEPARIN SODIUM (PORCINE) 5000 UNIT/ML IJ SOLN
5000.0000 [IU] | Freq: Three times a day (TID) | INTRAMUSCULAR | Status: DC
  Administered 2019-02-11 – 2019-02-19 (×24): 5000 [IU] via SUBCUTANEOUS
  Filled 2019-02-10 (×24): qty 1

## 2019-02-10 MED ORDER — ACETAMINOPHEN 325 MG PO TABS
650.0000 mg | ORAL_TABLET | Freq: Four times a day (QID) | ORAL | Status: DC | PRN
  Administered 2019-02-11 – 2019-02-12 (×2): 650 mg via ORAL
  Filled 2019-02-10 (×3): qty 2

## 2019-02-10 MED ORDER — VITAMIN B-12 1000 MCG PO TABS
1000.0000 ug | ORAL_TABLET | Freq: Every day | ORAL | Status: DC
  Administered 2019-02-11 – 2019-02-19 (×9): 1000 ug via ORAL
  Filled 2019-02-10 (×9): qty 1

## 2019-02-10 MED ORDER — TAMSULOSIN HCL 0.4 MG PO CAPS
0.4000 mg | ORAL_CAPSULE | Freq: Every day | ORAL | Status: DC
  Administered 2019-02-11 – 2019-02-19 (×9): 0.4 mg via ORAL
  Filled 2019-02-10 (×9): qty 1

## 2019-02-10 MED ORDER — INSULIN ASPART 100 UNIT/ML ~~LOC~~ SOLN
0.0000 [IU] | Freq: Three times a day (TID) | SUBCUTANEOUS | Status: DC
  Administered 2019-02-12: 1 [IU] via SUBCUTANEOUS
  Administered 2019-02-12: 2 [IU] via SUBCUTANEOUS
  Administered 2019-02-12 – 2019-02-13 (×2): 1 [IU] via SUBCUTANEOUS
  Administered 2019-02-16: 3 [IU] via SUBCUTANEOUS
  Administered 2019-02-17: 2 [IU] via SUBCUTANEOUS
  Administered 2019-02-17: 1 [IU] via SUBCUTANEOUS
  Administered 2019-02-17 – 2019-02-18 (×2): 2 [IU] via SUBCUTANEOUS
  Administered 2019-02-18: 3 [IU] via SUBCUTANEOUS
  Administered 2019-02-18: 1 [IU] via SUBCUTANEOUS
  Administered 2019-02-19: 2 [IU] via SUBCUTANEOUS
  Administered 2019-02-19: 1 [IU] via SUBCUTANEOUS

## 2019-02-10 MED ORDER — ACETAMINOPHEN 650 MG RE SUPP: 650.0000 mg | Freq: Four times a day (QID) | RECTAL | Status: DC | PRN

## 2019-02-10 MED ORDER — FOLIC ACID 1 MG PO TABS
1.0000 mg | ORAL_TABLET | Freq: Every day | ORAL | Status: DC
  Administered 2019-02-11 – 2019-02-19 (×9): 1 mg via ORAL
  Filled 2019-02-10 (×9): qty 1

## 2019-02-10 MED ORDER — LEVOTHYROXINE SODIUM 75 MCG PO TABS
75.0000 ug | ORAL_TABLET | Freq: Every day | ORAL | Status: DC
  Administered 2019-02-11 – 2019-02-19 (×9): 75 ug via ORAL
  Filled 2019-02-10 (×8): qty 1

## 2019-02-10 MED ORDER — ALLOPURINOL 100 MG PO TABS
50.0000 mg | ORAL_TABLET | Freq: Every day | ORAL | Status: DC
  Administered 2019-02-11 – 2019-02-19 (×9): 50 mg via ORAL
  Filled 2019-02-10 (×9): qty 1

## 2019-02-10 MED ORDER — SODIUM CHLORIDE 0.9% FLUSH: 3.0000 mL | Freq: Once | INTRAVENOUS | Status: DC

## 2019-02-10 MED ORDER — ONDANSETRON HCL 4 MG PO TABS: 4.0000 mg | ORAL_TABLET | Freq: Four times a day (QID) | ORAL | Status: DC | PRN

## 2019-02-10 MED ORDER — DARIFENACIN HYDROBROMIDE ER 7.5 MG PO TB24
7.5000 mg | ORAL_TABLET | Freq: Every day | ORAL | Status: DC
  Administered 2019-02-11: 7.5 mg via ORAL
  Filled 2019-02-10: qty 1

## 2019-02-10 NOTE — Telephone Encounter (Signed)
Patient's daughter in law called stating that Ms. Erika Fry's Bilateral legs are extremely swollen and she has a rash on both.  She states Ms. Erika Fry has clear drainage draining from the left lower leg.  Ms. Erika Fry is currently at the [facility] ED but has not been seen yet.  Amber wanted Dr. Baxter Flattery to be made aware. Pricilla Riffle RN

## 2019-02-10 NOTE — ED Triage Notes (Signed)
Pt states bilateral leg swelling since coming off of lasix. States redness noted to the left leg. Pt denies shortness of breath. Afebrile in triage.

## 2019-02-10 NOTE — ED Notes (Signed)
ED TO INPATIENT HANDOFF REPORT  ED Nurse Name and Phone #: Ian Bushman [phone]  S Name/Age/Gender Erika Fry 73 y.o. female Room/Bed: 031C/031C  Code Status   Code Status: Prior  Home/SNF/Other Discharge to home Patient oriented OJ:[id number] x4 Is this baseline? yes  Triage Complete: Triage complete  Chief Complaint leg swelling  Triage Note Pt states bilateral leg swelling since coming off of lasix. States redness noted to the left leg. Pt denies shortness of breath. Afebrile in triage.    Allergies Allergies  Allergen Reactions  . Baclofen Other (See Comments)    somnolence  . Codeine Other (See Comments)    Increases Pain and couldn't sleep    Level of Care/Admitting Diagnosis ED Disposition    ED Disposition Condition Comment   Admit  Hospital Area: [facility] [[id number]]  Level of Care: Telemetry Medical [104]  Diagnosis: ARF (acute renal failure) [facility]) [[id number]]  Admitting Physician: Rise Patience [phone]  Attending Physician: Rise Patience [phone]  Estimated length of stay: past midnight tomorrow  Certification:: I certify this patient will need inpatient services for at least 2 midnights  PT Class (Do Not Modify): Inpatient [101]  PT Acc Code (Do Not Modify): Private [1]       B Medical/Surgery History Past Medical History:  Diagnosis Date  . Arthritis    "knees, thumbs" (03/25/2018)  . CKD (chronic kidney disease), stage IV ([place])   . Diet-controlled diabetes mellitus ([place])   . GERD (gastroesophageal reflux disease)   . Gout    "on daily RX" (03/25/2018)  . Heart murmur   . High cholesterol   . Hypertension   . Hypothyroidism   . Iron deficiency anemia    "had to get an iron infusion"  . Migraine    "used to have them growing up" (03/25/2018)  . Presence of permanent cardiac pacemaker 03/25/2018   Past Surgical History:  Procedure Laterality Date  . APPENDECTOMY    . BIOPSY  05/19/2018   Procedure: BIOPSY;   Surgeon: Ladene Artist, MD;  Location: [facility];  Service: Endoscopy;;  . Consuela Mimes W/ RETROGRADES Bilateral 01/14/2019   Procedure: CYSTOSCOPY WITH RETROGRADE PYELOGRAM BILATERAL HYDRODISTENTION;  Surgeon: Ardis Hughs, MD;  Location: [facility];  Service: Urology;  Laterality: Bilateral;  . ESOPHAGOGASTRODUODENOSCOPY N/A 05/19/2018   Procedure: ESOPHAGOGASTRODUODENOSCOPY (EGD);  Surgeon: Ladene Artist, MD;  Location: [facility] ENDOSCOPY;  Service: Endoscopy;  Laterality: N/A;  . ESOPHAGOGASTRODUODENOSCOPY (EGD) WITH PROPOFOL N/A 01/28/2019   Procedure: ESOPHAGOGASTRODUODENOSCOPY (EGD) WITH PROPOFOL;  Surgeon: Rush Landmark Telford Nab., MD;  Location: [facility];  Service: Gastroenterology;  Laterality: N/A;  . INSERT / REPLACE / REMOVE PACEMAKER  03/25/2018  . IR FLUORO GUIDE CV LINE RIGHT  05/18/2018  . IR LUMBAR DISC ASPIRATION W/IMG GUIDE  05/15/2018  . IR REMOVAL TUN CV CATH W/O FL  07/23/2018  . IR US GUIDE VASC ACCESS RIGHT  05/18/2018  . KNEE ARTHROSCOPY Bilateral   . PACEMAKER IMPLANT N/A 03/25/2018   Procedure: PACEMAKER IMPLANT;  Surgeon: Evans Lance, MD;  Location: [facility];  Service: Cardiovascular;  Laterality: N/A;  . PACEMAKER PLACEMENT Left 03/2018  . POLYPECTOMY  01/28/2019   Procedure: POLYPECTOMY;  Surgeon: Mansouraty, Telford Nab., MD;  Location: [facility];  Service: Gastroenterology;;  . RIGHT HEART CATH N/A 03/20/2018   Procedure: RIGHT HEART CATH;  Surgeon: Nigel Mormon, MD;  Location: [facility];  Service: Cardiovascular;  Laterality: N/A;  . TEE WITHOUT CARDIOVERSION  N/A 03/20/2018   Procedure: TRANSESOPHAGEAL ECHOCARDIOGRAM (TEE);  Surgeon: Nigel Mormon, MD;  Location: [facility] ENDOSCOPY;  Service: Cardiovascular;  Laterality: N/A;  . TONSILLECTOMY    . URETEROSCOPY WITH HOLMIUM LASER LITHOTRIPSY Right 01/14/2019   Procedure: URETEROSCOPY WITH HOLMIUM LASER LITHOTRIPSY BLADDER BIOPSIES RIGHT STENT PLACEMENT;  Surgeon: Ardis Hughs, MD;  Location: [facility];  Service: Urology;  Laterality: Right;     A IV Location/Drains/Wounds Patient Lines/Drains/Airways Status   Active Line/Drains/Airways    Name:   Placement date:   Placement time:   Site:   Days:   Peripheral IV 01/28/19 Right Forearm   01/28/19    0821    Forearm   13   Ureteral Drain/Stent Right ureter 6 Fr.   01/14/19    0944    Right ureter   27   Airway   01/28/19    0935     13   Pressure Injury 01/17/19 Stage I -  Intact skin with non-blanchable redness of a localized area usually over a bony prominence.   01/17/19    0230     24   Wound / Incision (Open or Dehisced) 05/31/18 Puncture Back Left;Lower   05/31/18    0111    Back   255          Intake/Output Last 24 hours No intake or output data in the 24 hours ending 02/10/19 2152  Labs/Imaging Results for orders placed or performed during the hospital encounter of 02/10/19 (from the past 48 hour(s))  Comprehensive metabolic panel     Status: Abnormal   Collection Time: 02/10/19 12:05 PM  Result Value Ref Range   Sodium 133 (L) 135 - 145 mmol/L   Potassium 3.5 3.5 - 5.1 mmol/L   Chloride 103 98 - 111 mmol/L   CO2 18 (L) 22 - 32 mmol/L   Glucose, Bld 115 (H) 70 - 99 mg/dL   BUN 64 (H) 8 - 23 mg/dL   Creatinine, Ser 4.22 (H) 0.44 - 1.00 mg/dL   Calcium 10.0 8.9 - 10.3 mg/dL   Total Protein 6.4 (L) 6.5 - 8.1 g/dL   Albumin 3.3 (L) 3.5 - 5.0 g/dL   AST 13 (L) 15 - 41 U/L   ALT 8 0 - 44 U/L   Alkaline Phosphatase 101 38 - 126 U/L   Total Bilirubin 0.5 0.3 - 1.2 mg/dL   GFR calc non Af Amer 10 (L) >60 mL/min   GFR calc Af Amer 11 (L) >60 mL/min   Anion gap 12 5 - 15    Comment: Performed at [facility], [address] [place], [place] [address]  CBC with Differential     Status: Abnormal   Collection Time: 02/10/19 12:05 PM  Result Value Ref Range   WBC 5.1 4.0 - 10.5 K/uL   RBC 2.73 (L) 3.87 - 5.11 MIL/uL   Hemoglobin 7.9 (L) 12.0 - 15.0 g/dL   HCT 24.8 (L) 36.0 - 46.0 %   MCV  90.8 80.0 - 100.0 fL   MCH 28.9 26.0 - 34.0 pg   MCHC 31.9 30.0 - 36.0 g/dL   RDW 14.2 11.5 - 15.5 %   Platelets 218 150 - 400 K/uL   nRBC 0.0 0.0 - 0.2 %   Neutrophils Relative % 59 %   Neutro Abs 3.0 1.7 - 7.7 K/uL   Lymphocytes Relative 21 %   Lymphs Abs 1.1 0.7 - 4.0 K/uL   Monocytes Relative 13 %  Monocytes Absolute 0.7 0.1 - 1.0 K/uL   Eosinophils Relative 6 %   Eosinophils Absolute 0.3 0.0 - 0.5 K/uL   Basophils Relative 1 %   Basophils Absolute 0.1 0.0 - 0.1 K/uL   Immature Granulocytes 0 %   Abs Immature Granulocytes 0.02 0.00 - 0.07 K/uL    Comment: Performed at [facility], [place] [address] [place], [place] [address]  Brain natriuretic peptide     Status: None   Collection Time: 02/10/19  5:15 PM  Result Value Ref Range   B Natriuretic Peptide 70.3 0.0 - 100.0 pg/mL    Comment: Performed at [facility] [address] [place], [place] [address]  Urinalysis, Routine w reflex microscopic     Status: Abnormal   Collection Time: 02/10/19  5:26 PM  Result Value Ref Range   Color, Urine YELLOW YELLOW   APPearance TURBID (A) CLEAR   Specific Gravity, Urine 1.008 1.005 - 1.030   pH 5.0 5.0 - 8.0   Glucose, UA NEGATIVE NEGATIVE mg/dL   Hgb urine dipstick SMALL (A) NEGATIVE   Bilirubin Urine NEGATIVE NEGATIVE   Ketones, ur NEGATIVE NEGATIVE mg/dL   Protein, ur 30 (A) NEGATIVE mg/dL   Nitrite NEGATIVE NEGATIVE   Leukocytes,Ua LARGE (A) NEGATIVE   RBC / HPF 0-5 0 - 5 RBC/hpf   WBC, UA >50 (H) 0 - 5 WBC/hpf   Bacteria, UA FEW (A) NONE SEEN   Squamous Epithelial / LPF 0-5 0 - 5   WBC Clumps PRESENT    Non Squamous Epithelial 0-5 (A) NONE SEEN    Comment: Performed at [facility], [place] [address] [place], [place] [address]   Dg Chest 2 View  Result Date: 02/10/2019 CLINICAL DATA:  Acute onset of bilateral lower extremity edema. EXAM: CHEST - 2 VIEW COMPARISON:  Chest radiograph performed 01/16/2019. FINDINGS: The lungs are well-aerated and clear.  There is no evidence of focal opacification, pleural effusion or pneumothorax. The heart is mildly enlarged. A pacemaker is noted at the left chest wall, with leads ending at the right atrium and right ventricle. No acute osseous abnormalities are seen. IMPRESSION: Mild cardiomegaly. Lungs remain grossly clear. Electronically Signed   By: Garald Balding M.D.   On: 02/10/2019 17:43   Ct Renal Stone Study  Result Date: 02/10/2019 CLINICAL DATA:  Bilateral leg swelling since coming off of Lasix. Erythema of the left leg also noted. EXAM: CT ABDOMEN AND PELVIS WITHOUT CONTRAST TECHNIQUE: Multidetector CT imaging of the abdomen and pelvis was performed following the standard protocol without IV contrast. COMPARISON:  01/19/2019 FINDINGS: Lower chest: Noncalcified masslike opacity in the lingula is identified measuring 16 x 14 mm, series 7/. Bibasilar dependent atelectasis is identified. Possible additional tiny nodule in posterior right lower lobe measuring 3 mm. Left main and three-vessel coronary arteriosclerosis. Right-sided pacer leads are present. No significant pericardial effusion or thickening. Small hiatal hernia. Hepatobiliary: The unenhanced liver is unremarkable without apparent mass or biliary dilatation given limitations of a noncontrast study. Gallbladder contains layering sludge and/or calculi. No secondary signs of acute cholecystitis. A calcification is noted in the duodenum possibly representing a distal common bowel duct stone measuring 6 mm. Correlate with liver function tests. If necessary, MRCP or ERCP may help further assessment, series 3/39. Pancreas: Atrophic without inflammation, ductal dilatation or mass. Spleen: No splenomegaly Adrenals/Urinary Tract: Normal bilateral adrenal glands. A 5 mm nonobstructing left upper pole renal calculus is identified. At least 4-5 renal stones are  noted in the interpolar aspect of the right kidney, the largest measuring up to approximately 4 mm with  moderate to marked dilatation of the right renal collecting system. Right-sided urothelial thickening is noted the level of the urinary bladder. The bladder is also thickened in appearance some which is due to underdistention but cystitis is also possibility. No definite ureteral stones are identified. Stomach/Bowel: Small hiatal hernia. Physiologic distention of the stomach with normal small bowel rotation. No small bowel obstruction are inflammation. Moderate to marked stool retention throughout the colon consistent with constipation. Appendix appears normal. Vascular/Lymphatic: Aortic atherosclerosis. No enlarged abdominal or pelvic lymph nodes. Reproductive: Uterus and bilateral adnexa are unremarkable. Other: Presacral edema.  No free air nor free fluid. Musculoskeletal: Lumbar spondylosis most marked at L4-5 with discogenic sclerosis. IMPRESSION: 1. **An incidental finding of potential clinical significance has been found. Lingular masslike abnormality measuring 16 x 14 mm, partially included on this study. Dedicated chest CT is recommended. Findings raise concern for pulmonary neoplasm. Additional tiny 3 mm nodule in the subpleural right lower lobe is indeterminate for neoplasm versus post inflammatory/infectious change.** 2. Bilateral renal calculi are identified without obstructive uropathy. Moderate-to-marked chronic dilatation of the right renal collecting system with diffuse urothelial and bladder wall thickening raise concern for stigmata urinary tract infection. Correlate. 3. Stool is noted throughout the colon consistent with constipation. 4. 6 mm calcification in the region of the second portion of the duodenum may represent choledocholithiasis. Correlate with liver function tests. Additional layering gallstones and calculi without secondary signs of cholecystitis are noted within the gallbladder. Electronically Signed   By: Ashley Royalty M.D.   On: 02/10/2019 20:54    Pending Labs Unresulted Labs  (From admission, onward)   None      Vitals/Pain Today's Vitals   02/10/19 1930 02/10/19 2017 02/10/19 2100 02/10/19 2130  BP: 112/60 (!) 146/77 (!) 153/70 132/61  Pulse: 79 82 85 84  Resp:  18    Temp:  98.6 F (37 C)    TempSrc:  Oral    SpO2: 91% 100% 99% 96%  PainSc:  0-No pain      Isolation Precautions No active isolations  Medications Medications  sodium chloride flush (NS) 0.9 % injection 3 mL (3 mLs Intravenous Not Given 02/10/19 2029)    Mobility Moderate fall risk   Focused Assessments +3 swelling to bilateral legs   R Recommendations: See Admitting Provider Note  Report given to:   Additional Notes: pt using purewick, frequently needs to void; increased difficulty ambulating at home d/t swelling

## 2019-02-10 NOTE — ED Notes (Signed)
Admitting MD at bedside.

## 2019-02-10 NOTE — H&P (Signed)
History and Physical    Erika Fry CNO:[id number] DOB: [birth year] DOA: 02/10/2019  PCP: [name], MD  Patient coming from: Home.  Chief Complaint: Increasing lower extremity edema.  HPI: Erika Fry is a 73 y.o. female with history of diabetes mellitus type 2, chronic kidney disease stage IV, hypertension, hypothyroidism, anemia, pacemaker placement who was recently admitted and discharged on January 29, 2019.  During the stay patient was evaluated for acute renal failure had to undergo stent placement and also hydration creatinine at that time was 6 which improved.  Patient also had EGD and worked up for fever source could have been UTI but also was looking for other causes I had arthrocentesis done for the knee and MRI did not show any recurrence of discitis presents to the ER because of increasing lower extremity edema over the last week.  Denies any nausea vomiting or diarrhea.  Denies any abdominal pain.  ED Course: In the ER labs show creatinine is 4.2 which is increased from 2.5 in January 29, 2019.  Chest x-ray shows mild cardiomegaly.  CT renal study was done due to recent stent placement which does not show any obvious obstruction does show an lingula mass in the lung concerning for neoplasm.  Also shows possible choledocholithiasis but patient denies any abdominal pain and does not have any elevated LFTs.  UA shows features concerning for UTI but patient is asymptomatic did undergo procedure by urologist 24 hours ago for possible interstitial cystitis.  Patient admitted for acute renal failure.  Review of Systems: As per HPI, rest all negative.   Past Medical History:  Diagnosis Date  . Arthritis    "knees, thumbs" (03/25/2018)  . CKD (chronic kidney disease), stage IV ([place])   . Diet-controlled diabetes mellitus ([place])   . GERD (gastroesophageal reflux disease)   . Gout    "on daily RX" (03/25/2018)  . Heart murmur   . High cholesterol   . Hypertension    . Hypothyroidism   . Iron deficiency anemia    "had to get an iron infusion"  . Migraine    "used to have them growing up" (03/25/2018)  . Presence of permanent cardiac pacemaker 03/25/2018    Past Surgical History:  Procedure Laterality Date  . APPENDECTOMY    . BIOPSY  05/19/2018   Procedure: BIOPSY;  Surgeon: Ladene Artist, MD;  Location: [facility];  Service: Endoscopy;;  . Consuela Mimes W/ RETROGRADES Bilateral 01/14/2019   Procedure: CYSTOSCOPY WITH RETROGRADE PYELOGRAM BILATERAL HYDRODISTENTION;  Surgeon: Ardis Hughs, MD;  Location: [facility];  Service: Urology;  Laterality: Bilateral;  . ESOPHAGOGASTRODUODENOSCOPY N/A 05/19/2018   Procedure: ESOPHAGOGASTRODUODENOSCOPY (EGD);  Surgeon: Ladene Artist, MD;  Location: [facility] ENDOSCOPY;  Service: Endoscopy;  Laterality: N/A;  . ESOPHAGOGASTRODUODENOSCOPY (EGD) WITH PROPOFOL N/A 01/28/2019   Procedure: ESOPHAGOGASTRODUODENOSCOPY (EGD) WITH PROPOFOL;  Surgeon: Rush Landmark Telford Nab., MD;  Location: [place];  Service: Gastroenterology;  Laterality: N/A;  . INSERT / REPLACE / REMOVE PACEMAKER  03/25/2018  . IR FLUORO GUIDE CV LINE RIGHT  05/18/2018  . IR LUMBAR DISC ASPIRATION W/IMG GUIDE  05/15/2018  . IR REMOVAL TUN CV CATH W/O FL  07/23/2018  . IR US GUIDE VASC ACCESS RIGHT  05/18/2018  . KNEE ARTHROSCOPY Bilateral   . PACEMAKER IMPLANT N/A 03/25/2018   Procedure: PACEMAKER IMPLANT;  Surgeon: Evans Lance, MD;  Location: [facility];  Service: Cardiovascular;  Laterality: N/A;  . PACEMAKER PLACEMENT Left 03/2018  . POLYPECTOMY  01/28/2019   Procedure: POLYPECTOMY;  Surgeon: Rush Landmark Telford Nab., MD;  Location: [place];  Service: Gastroenterology;;  . RIGHT HEART CATH N/A 03/20/2018   Procedure: RIGHT HEART CATH;  Surgeon: Nigel Mormon, MD;  Location: [place] [facility];  Service: Cardiovascular;  Laterality: N/A;  . TEE WITHOUT CARDIOVERSION N/A 03/20/2018   Procedure: TRANSESOPHAGEAL ECHOCARDIOGRAM  (TEE);  Surgeon: Nigel Mormon, MD;  Location: [facility] ENDOSCOPY;  Service: Cardiovascular;  Laterality: N/A;  . TONSILLECTOMY    . URETEROSCOPY WITH HOLMIUM LASER LITHOTRIPSY Right 01/14/2019   Procedure: URETEROSCOPY WITH HOLMIUM LASER LITHOTRIPSY BLADDER BIOPSIES RIGHT STENT PLACEMENT;  Surgeon: Ardis Hughs, MD;  Location: [facility];  Service: Urology;  Laterality: Right;     reports that she has never smoked. She has never used smokeless tobacco. She reports previous alcohol use. She reports that she does not use drugs.  Allergies  Allergen Reactions  . Baclofen Other (See Comments)    somnolence  . Codeine Other (See Comments)    Increases Pain and couldn't sleep    Family History  Problem Relation Age of Onset  . Hypertension Mother   . Diabetes Mellitus II Father   . Heart disease Father   . Gastric cancer Brother   . Diabetes Mellitus II Brother     Prior to Admission medications   Medication Sig Start Date End Date Taking? Authorizing Provider  allopurinol (ZYLOPRIM) 100 MG tablet Take 0.5 tablets (50 mg total) by mouth daily. 01/29/19  Yes Elodia Florence., MD  Ascorbic Acid (VITAMIN C PO) Take 1 tablet by mouth 2 (two) times daily.   Yes [provider]  Biotin 5 MG CAPS Take 1 capsule (5 mg total) by mouth daily. 01/14/19  Yes Ardis Hughs, MD  cetirizine (ZYRTEC) 10 MG tablet Take 10 mg by mouth at bedtime.    Yes [provider]  diazepam (VALIUM) 10 MG tablet 10 mg See admin instructions. Insert 10 mg vaginally at bedtime   Yes [provider]  ferrous sulfate 325 (65 FE) MG tablet Take 1 tablet (325 mg total) by mouth 2 (two) times daily with a meal. 06/22/18  Yes Love, Ivan Anchors, PA-C  fesoterodine (TOVIAZ) 4 MG TB24 tablet Take 4 mg by mouth at bedtime.   Yes [provider]  fluticasone (FLONASE) 50 MCG/ACT nasal spray Place 2 sprays into both nostrils daily as needed for allergies or rhinitis.   Yes [provider]  folic acid (FOLVITE) 1 MG tablet Take 1 tablet (1 mg total) by mouth daily. 06/22/18  Yes Love, Ivan Anchors, PA-C  gabapentin (NEURONTIN) 300 MG capsule Take 300 mg by mouth See admin instructions. Take one capsule on day 1, then 2 capsules day 2 then take 3 capsules thereafter 02/09/19  Yes [provider]  levothyroxine (SYNTHROID, LEVOTHROID) 75 MCG tablet Take 75 mcg by mouth daily before breakfast.   Yes [provider]  Lidocaine 5 % CREA Apply 1 application topically as needed (for pain).   Yes [provider]  mirabegron ER (MYRBETRIQ) 50 MG TB24 tablet Take 50 mg by mouth daily.   Yes [provider]  Multiple Vitamin (MULTI-VITAMIN PO) Take 1 tablet by mouth daily.   Yes [provider]  oxyCODONE-acetaminophen (PERCOCET/ROXICET) 5-325 MG tablet Take 1 tablet by mouth 2 (two) times daily. 02/09/19  Yes [provider]  pantoprazole (PROTONIX) 40 MG tablet Take 1 tablet (40 mg total) by mouth 2 (two) times daily  for 30 days. (take once daily after 1 month) 01/29/19 02/28/19 Yes Elodia Florence., MD  simvastatin (ZOCOR) 40 MG tablet Take 0.5 tablets (20 mg total) by mouth at bedtime. Patient taking differently: Take 40 mg by mouth at bedtime.  06/22/18  Yes Love, Ivan Anchors, PA-C  solifenacin (VESICARE) 5 MG tablet Take 5 mg by mouth daily. 02/04/19  Yes [provider]  tamsulosin (FLOMAX) 0.4 MG CAPS capsule Take 1 capsule (0.4 mg total) by mouth daily for 30 days. 01/29/19 02/28/19 Yes Elodia Florence., MD  vitamin B-12 (CYANOCOBALAMIN) 1000 MCG tablet Take 1 tablet (1,000 mcg total) by mouth daily. 06/22/18  Yes Love, Ivan Anchors, PA-C  oxycodone (OXY-IR) 5 MG capsule Take 1-2 capsules (5-10 mg total) by mouth every 4 (four) hours as needed. Patient not taking: Reported on 02/10/2019 01/14/19   Ardis Hughs, MD    Physical Exam: Vitals:   02/10/19 1930 02/10/19 2017 02/10/19 2100 02/10/19 2130  BP: 112/60  (!) 146/77 (!) 153/70 132/61  Pulse: 79 82 85 84  Resp:  18    Temp:  98.6 F (37 C)    TempSrc:  Oral    SpO2: 91% 100% 99% 96%      Constitutional: Moderately built and nourished. Vitals:   02/10/19 1930 02/10/19 2017 02/10/19 2100 02/10/19 2130  BP: 112/60 (!) 146/77 (!) 153/70 132/61  Pulse: 79 82 85 84  Resp:  18    Temp:  98.6 F (37 C)    TempSrc:  Oral    SpO2: 91% 100% 99% 96%   Eyes: Anicteric no pallor. ENMT: No discharge from the ears eyes nose and mouth. Neck: No mass felt.  No neck rigidity but no JVD appreciated. Respiratory: No rhonchi or crepitations. Cardiovascular: S1-S2 heard. Abdomen: Soft nontender bowel sounds. Musculoskeletal: Bilateral lower extremity edema present extending up to the thighs. Skin: No rash. Neurologic: Alert awake oriented to time place and person.  Moves all extremities. Psychiatric: Appears normal per normal affect.   Labs on Admission: I have personally reviewed following labs and imaging studies  CBC: Recent Labs  Lab 02/10/19 1205  WBC 5.1  NEUTROABS 3.0  HGB 7.9*  HCT 24.8*  MCV 90.8  PLT 546   Basic Metabolic Panel: Recent Labs  Lab 02/10/19 1205  NA 133*  K 3.5  CL 103  CO2 18*  GLUCOSE 115*  BUN 64*  CREATININE 4.22*  CALCIUM 10.0   GFR: Estimated Creatinine Clearance: 12.5 mL/min (A) (by C-G formula based on SCr of 4.22 mg/dL (H)). Liver Function Tests: Recent Labs  Lab 02/10/19 1205  AST 13*  ALT 8  ALKPHOS 101  BILITOT 0.5  PROT 6.4*  ALBUMIN 3.3*   No results for input(s): LIPASE, AMYLASE in the last 168 hours. No results for input(s): AMMONIA in the last 168 hours. Coagulation Profile: No results for input(s): INR, PROTIME in the last 168 hours. Cardiac Enzymes: No results for input(s): CKTOTAL, CKMB, CKMBINDEX, TROPONINI in the last 168 hours. BNP (last 3 results) No results for input(s): PROBNP in the last 8760 hours. HbA1C: No results for input(s): HGBA1C in the last 72  hours. CBG: No results for input(s): GLUCAP in the last 168 hours. Lipid Profile: No results for input(s): CHOL, HDL, LDLCALC, TRIG, CHOLHDL, LDLDIRECT in the last 72 hours. Thyroid Function Tests: No results for input(s): TSH, T4TOTAL, FREET4, T3FREE, THYROIDAB in the last 72 hours. Anemia Panel: No results for input(s): VITAMINB12, FOLATE, FERRITIN, TIBC, IRON,  RETICCTPCT in the last 72 hours. Urine analysis:    Component Value Date/Time   COLORURINE YELLOW 02/10/2019 1726   APPEARANCEUR TURBID (A) 02/10/2019 1726   LABSPEC 1.008 02/10/2019 1726   PHURINE 5.0 02/10/2019 1726   GLUCOSEU NEGATIVE 02/10/2019 1726   HGBUR SMALL (A) 02/10/2019 1726   BILIRUBINUR NEGATIVE 02/10/2019 1726   KETONESUR NEGATIVE 02/10/2019 1726   PROTEINUR 30 (A) 02/10/2019 1726   NITRITE NEGATIVE 02/10/2019 1726   LEUKOCYTESUR LARGE (A) 02/10/2019 1726   Sepsis Labs: @LABRCNTIP (procalcitonin:4,lacticidven:4) )No results found for this or any previous visit (from the past 240 hour(s)).   Radiological Exams on Admission: Dg Chest 2 View  Result Date: 02/10/2019 CLINICAL DATA:  Acute onset of bilateral lower extremity edema. EXAM: CHEST - 2 VIEW COMPARISON:  Chest radiograph performed 01/16/2019. FINDINGS: The lungs are well-aerated and clear. There is no evidence of focal opacification, pleural effusion or pneumothorax. The heart is mildly enlarged. A pacemaker is noted at the left chest wall, with leads ending at the right atrium and right ventricle. No acute osseous abnormalities are seen. IMPRESSION: Mild cardiomegaly. Lungs remain grossly clear. Electronically Signed   By: Garald Balding M.D.   On: 02/10/2019 17:43   Ct Renal Stone Study  Result Date: 02/10/2019 CLINICAL DATA:  Bilateral leg swelling since coming off of Lasix. Erythema of the left leg also noted. EXAM: CT ABDOMEN AND PELVIS WITHOUT CONTRAST TECHNIQUE: Multidetector CT imaging of the abdomen and pelvis was performed following the  standard protocol without IV contrast. COMPARISON:  01/19/2019 FINDINGS: Lower chest: Noncalcified masslike opacity in the lingula is identified measuring 16 x 14 mm, series 7/. Bibasilar dependent atelectasis is identified. Possible additional tiny nodule in posterior right lower lobe measuring 3 mm. Left main and three-vessel coronary arteriosclerosis. Right-sided pacer leads are present. No significant pericardial effusion or thickening. Small hiatal hernia. Hepatobiliary: The unenhanced liver is unremarkable without apparent mass or biliary dilatation given limitations of a noncontrast study. Gallbladder contains layering sludge and/or calculi. No secondary signs of acute cholecystitis. A calcification is noted in the duodenum possibly representing a distal common bowel duct stone measuring 6 mm. Correlate with liver function tests. If necessary, MRCP or ERCP may help further assessment, series 3/39. Pancreas: Atrophic without inflammation, ductal dilatation or mass. Spleen: No splenomegaly Adrenals/Urinary Tract: Normal bilateral adrenal glands. A 5 mm nonobstructing left upper pole renal calculus is identified. At least 4-5 renal stones are noted in the interpolar aspect of the right kidney, the largest measuring up to approximately 4 mm with moderate to marked dilatation of the right renal collecting system. Right-sided urothelial thickening is noted the level of the urinary bladder. The bladder is also thickened in appearance some which is due to underdistention but cystitis is also possibility. No definite ureteral stones are identified. Stomach/Bowel: Small hiatal hernia. Physiologic distention of the stomach with normal small bowel rotation. No small bowel obstruction are inflammation. Moderate to marked stool retention throughout the colon consistent with constipation. Appendix appears normal. Vascular/Lymphatic: Aortic atherosclerosis. No enlarged abdominal or pelvic lymph nodes. Reproductive: Uterus  and bilateral adnexa are unremarkable. Other: Presacral edema.  No free air nor free fluid. Musculoskeletal: Lumbar spondylosis most marked at L4-5 with discogenic sclerosis. IMPRESSION: 1. **An incidental finding of potential clinical significance has been found. Lingular masslike abnormality measuring 16 x 14 mm, partially included on this study. Dedicated chest CT is recommended. Findings raise concern for pulmonary neoplasm. Additional tiny 3 mm nodule in the subpleural right lower lobe is  indeterminate for neoplasm versus post inflammatory/infectious change.** 2. Bilateral renal calculi are identified without obstructive uropathy. Moderate-to-marked chronic dilatation of the right renal collecting system with diffuse urothelial and bladder wall thickening raise concern for stigmata urinary tract infection. Correlate. 3. Stool is noted throughout the colon consistent with constipation. 4. 6 mm calcification in the region of the second portion of the duodenum may represent choledocholithiasis. Correlate with liver function tests. Additional layering gallstones and calculi without secondary signs of cholecystitis are noted within the gallbladder. Electronically Signed   By: Ashley Royalty M.D.   On: 02/10/2019 20:54     Assessment/Plan Principal Problem:   ARF (acute renal failure) (HCC) Active Problems:   CKD (chronic kidney disease), stage IV ([place])   Diet-controlled diabetes mellitus ([place])   Hypothyroidism   Essential hypertension    1. Acute on chronic disease stage IV -patient has significant lower external edema.  UA looks infected but I have not started any antibiotics.  CT renal study does not show any obvious obstruction.  I have not started patient on Lasix but will get nephrology consult before any intervention. 2. Lingular mass concerning for neoplasm will need dedicated CT scan. 3. Choledocholithiasis seen in the CAT scan but patient is asymptomatic.  Will follow LFTs if LFTs increase may  need MRCP or GI consult. 4. Hypothyroidism on Synthroid. 5. Anemia likely from renal disease.  Has had recent EGD done.  On a PPI. 6. Diabetes mellitus type 2 we will keep patient on sliding scale coverage. 7. Recent procedure for obstructive uropathy and also patient being followed by urologist for interstitial cystitis.   DVT prophylaxis: Heparin. Code Status: Full code. Family Communication: Patient's daughter. Disposition Plan: Home. Consults called: None. Admission status: Inpatient.   Rise Patience MD [facility] Pager [phone].  If 7PM-7AM, please contact night-coverage [email or web address] Password [facility]  02/10/2019, 10:34 PM

## 2019-02-10 NOTE — ED Provider Notes (Addendum)
Medical screening examination/treatment/procedure(s) were conducted as a shared visit with non-physician practitioner(s) and myself.  I personally evaluated the patient during the encounter.  None Patient does have bilateral lower extremity swelling this been worsening for a couple of days.  She reports her Lasix had been decreased due to her kidney function but she was started on torsemide 5 days ago.  She reports her doctor told her if she did not improve she was going to need a recheck.  She reports she has not seen improvement and feels that she is getting worse.  Patient reports she has become somewhat more short of breath.  She is also noticed some increased redness in her legs as well.  Patient is alert and appropriate.  No significant respiratory distress at rest.  Breath sounds are soft without any clearly ausculable crackles.  Patient does have chest wall thickness/obesity.  Heart regular.  Lower extremities 3+ edema see attached images.  Patient with increasing renal insufficiency/failure with outpatient management of volume overload with torsemide.  Plan for admission.  I agree with plan of management.   Charlesetta Shanks, MD 02/13/19 [place]    Charlesetta Shanks, MD 02/13/19 1349

## 2019-02-10 NOTE — ED Provider Notes (Signed)
[facility] EMERGENCY DEPARTMENT Provider Note   CSN: [id number] Arrival date & time: 02/10/19  1152    History   Chief Complaint Chief Complaint  Patient presents with  . Leg Swelling    HPI Erika Fry is a 73 y.o. female.     73 y.o female with a PMH of CKD stage 4, HTN, high cholesterol presents to the ED with a BL leg swelling x few days.  Patient reports she was recently hospitalized, during this visit they decreased her lasix, she is unsure how much of the therapy was decreased. She reports her PCP send her in some tamsulosin to help with the swelling about 2 weeks ago, she reports being told if no improvement within 5 days she should return to the office. She reports legs have worsen, she is currently ambulating with a walker. Patient also reports having a bladder instillation yesterday, due to her incomplete void.  She also endorses some shortness of breath with exertion along with left leg weeping.  She reports being followed by Dr. Anthonette Legato her nephrologist.     Past Medical History:  Diagnosis Date  . Arthritis    "knees, thumbs" (03/25/2018)  . CKD (chronic kidney disease), stage IV ([place])   . Diet-controlled diabetes mellitus ([place])   . GERD (gastroesophageal reflux disease)   . Gout    "on daily RX" (03/25/2018)  . Heart murmur   . High cholesterol   . Hypertension   . Hypothyroidism   . Iron deficiency anemia    "had to get an iron infusion"  . Migraine    "used to have them growing up" (03/25/2018)  . Presence of permanent cardiac pacemaker 03/25/2018    Patient Active Problem List   Diagnosis Date Noted  . Fever 01/17/2019  . Acute lower UTI 01/17/2019  . Pressure injury of skin 01/17/2019  . Gross hematuria 01/14/2019  . Fluid retention   . Generalized edema   . Hypokalemia   . Acute right-sided low back pain with right-sided sciatica   . Acute blood loss anemia   . Anemia of chronic disease   . Thrombocytopenia ([place])    . Diabetes mellitus ([place])   . Morbid obesity ([place])   . Benign essential HTN   . Hypoalbuminemia due to protein-calorie malnutrition ([place])   . Occult blood in stools   . Gastric polyp   . Lumbar discitis 05/17/2018  . Blood loss anemia 05/12/2018  . Hyperlipidemia associated with type 2 diabetes mellitus ([place]) 05/10/2018  . Essential hypertension 04/29/2018  . CKD (chronic kidney disease), stage IV ([place])   . Diet-controlled diabetes mellitus ([place])   . GERD (gastroesophageal reflux disease)   . Gout   . High cholesterol   . Hypothyroidism   . Iron deficiency anemia   . AV block, Mobitz 2 03/25/2018  . Mobitz type 2 second degree AV block 03/20/2018  . Aortic stenosis 03/20/2018  . Exertional dyspnea 03/19/2018    Past Surgical History:  Procedure Laterality Date  . APPENDECTOMY    . BIOPSY  05/19/2018   Procedure: BIOPSY;  Surgeon: Ladene Artist, MD;  Location: [facility];  Service: Endoscopy;;  . Consuela Mimes W/ RETROGRADES Bilateral 01/14/2019   Procedure: CYSTOSCOPY WITH RETROGRADE PYELOGRAM BILATERAL HYDRODISTENTION;  Surgeon: Ardis Hughs, MD;  Location: [facility];  Service: Urology;  Laterality: Bilateral;  . ESOPHAGOGASTRODUODENOSCOPY N/A 05/19/2018   Procedure: ESOPHAGOGASTRODUODENOSCOPY (EGD);  Surgeon: Ladene Artist, MD;  Location: [facility]COPY;  Service: Endoscopy;  Laterality: N/A;  . ESOPHAGOGASTRODUODENOSCOPY (EGD) WITH PROPOFOL N/A 01/28/2019   Procedure: ESOPHAGOGASTRODUODENOSCOPY (EGD) WITH PROPOFOL;  Surgeon: Rush Landmark Telford Nab., MD;  Location: [facility];  Service: Gastroenterology;  Laterality: N/A;  . INSERT / REPLACE / REMOVE PACEMAKER  03/25/2018  . IR FLUORO GUIDE CV LINE RIGHT  05/18/2018  . IR LUMBAR DISC ASPIRATION W/IMG GUIDE  05/15/2018  . IR REMOVAL TUN CV CATH W/O FL  07/23/2018  . IR US GUIDE VASC ACCESS RIGHT  05/18/2018  . KNEE ARTHROSCOPY Bilateral   . PACEMAKER IMPLANT N/A 03/25/2018   Procedure: PACEMAKER IMPLANT;  Surgeon: Evans Lance, MD;  Location: [facility] CV LAB;  Service: Cardiovascular;  Laterality: N/A;  . PACEMAKER PLACEMENT Left 03/2018  . POLYPECTOMY  01/28/2019   Procedure: POLYPECTOMY;  Surgeon: Mansouraty, Telford Nab., MD;  Location: [facility];  Service: Gastroenterology;;  . RIGHT HEART CATH N/A 03/20/2018   Procedure: RIGHT HEART CATH;  Surgeon: Nigel Mormon, MD;  Location: [facility] CV LAB;  Service: Cardiovascular;  Laterality: N/A;  . TEE WITHOUT CARDIOVERSION N/A 03/20/2018   Procedure: TRANSESOPHAGEAL ECHOCARDIOGRAM (TEE);  Surgeon: Nigel Mormon, MD;  Location: [facility] ENDOSCOPY;  Service: Cardiovascular;  Laterality: N/A;  . TONSILLECTOMY    . URETEROSCOPY WITH HOLMIUM LASER LITHOTRIPSY Right 01/14/2019   Procedure: URETEROSCOPY WITH HOLMIUM LASER LITHOTRIPSY BLADDER BIOPSIES RIGHT STENT PLACEMENT;  Surgeon: Ardis Hughs, MD;  Location: [facility];  Service: Urology;  Laterality: Right;     OB History   No obstetric history on file.      Home Medications    Prior to Admission medications   Medication Sig Start Date End Date Taking? Authorizing Provider  allopurinol (ZYLOPRIM) 100 MG tablet Take 0.5 tablets (50 mg total) by mouth daily. 01/29/19   Elodia Florence., MD  Ascorbic Acid (VITAMIN C PO) Take 1 tablet by mouth 2 (two) times daily.    [provider]  Biotin 5 MG CAPS Take 1 capsule (5 mg total) by mouth daily. 01/14/19   Ardis Hughs, MD  cetirizine (ZYRTEC) 10 MG tablet Take 10 mg by mouth at bedtime.     [provider]  diazepam (VALIUM) 10 MG tablet 10 mg See admin instructions. Insert 10 mg vaginally at bedtime    [provider]  ferrous sulfate 325 (65 FE) MG tablet Take 1 tablet (325 mg total) by mouth 2 (two) times daily with a meal. 06/22/18   Love, Ivan Anchors, PA-C  fesoterodine (TOVIAZ) 4 MG TB24 tablet Take 4 mg by mouth at bedtime.    [provider]  fluticasone (FLONASE) 50 MCG/ACT nasal spray Place 2  sprays into both nostrils daily as needed for allergies or rhinitis.    [provider]  folic acid (FOLVITE) 1 MG tablet Take 1 tablet (1 mg total) by mouth daily. 06/22/18   Love, Ivan Anchors, PA-C  levothyroxine (SYNTHROID, LEVOTHROID) 75 MCG tablet Take 75 mcg by mouth daily before breakfast.    [provider]  Lidocaine 5 % CREA Apply 1 application topically as needed (for pain).    [provider]  mirabegron ER (MYRBETRIQ) 50 MG TB24 tablet Take 50 mg by mouth daily.    [provider]  Multiple Vitamin (MULTI-VITAMIN PO) Take 1 tablet by mouth daily.    [provider]  oxycodone (OXY-IR) 5 MG capsule Take 1-2 capsules (5-10 mg total) by mouth every 4 (four) hours as needed. 01/14/19   Ardis Hughs,  MD  pantoprazole (PROTONIX) 40 MG tablet Take 1 tablet (40 mg total) by mouth 2 (two) times daily for 30 days. (take once daily after 1 month) 01/29/19 02/28/19  Elodia Florence., MD  simvastatin (ZOCOR) 40 MG tablet Take 0.5 tablets (20 mg total) by mouth at bedtime. Patient taking differently: Take 40 mg by mouth at bedtime.  06/22/18   Love, Ivan Anchors, PA-C  tamsulosin (FLOMAX) 0.4 MG CAPS capsule Take 1 capsule (0.4 mg total) by mouth daily for 30 days. 01/29/19 02/28/19  Elodia Florence., MD  vitamin B-12 (CYANOCOBALAMIN) 1000 MCG tablet Take 1 tablet (1,000 mcg total) by mouth daily. 06/22/18   Bary Leriche, PA-C    Family History Family History  Problem Relation Age of Onset  . Hypertension Mother   . Diabetes Mellitus II Father   . Heart disease Father   . Gastric cancer Brother   . Diabetes Mellitus II Brother     Social History Social History   Tobacco Use  . Smoking status: Never Smoker  . Smokeless tobacco: Never Used  Substance Use Topics  . Alcohol use: Not Currently  . Drug use: Never     Allergies   Baclofen and Codeine   Review of Systems Review of Systems  Constitutional: Negative for chills and  fever.  HENT: Negative for ear pain and sore throat.   Eyes: Negative for pain and visual disturbance.  Respiratory: Negative for cough and shortness of breath.   Cardiovascular: Positive for leg swelling (bilateral). Negative for chest pain and palpitations.  Gastrointestinal: Negative for abdominal pain and vomiting.  Genitourinary: Negative for dysuria and hematuria.  Musculoskeletal: Negative for arthralgias and back pain.  Skin: Negative for color change and rash.  Neurological: Negative for seizures and syncope.  All other systems reviewed and are negative.    Physical Exam Updated Vital Signs BP (!) 146/77   Pulse 82   Temp 98.6 F (37 C) (Oral)   Resp 18   SpO2 100%   Physical Exam Vitals signs and nursing note reviewed.  Constitutional:      General: She is not in acute distress.    Appearance: She is well-developed.  HENT:     Head: Normocephalic and atraumatic.     Mouth/Throat:     Pharynx: No oropharyngeal exudate.  Eyes:     Pupils: Pupils are equal, round, and reactive to light.  Neck:     Musculoskeletal: Normal range of motion.  Cardiovascular:     Rate and Rhythm: Regular rhythm.     Heart sounds: Normal heart sounds.  Pulmonary:     Effort: Pulmonary effort is normal. No respiratory distress.     Breath sounds: Normal breath sounds.  Abdominal:     General: Bowel sounds are normal. There is no distension.     Palpations: Abdomen is soft.     Tenderness: There is no abdominal tenderness.  Musculoskeletal:        General: No tenderness or deformity.     Right lower leg: No edema.     Left lower leg: No edema.  Skin:    General: Skin is warm and dry.  Neurological:     Mental Status: She is alert and oriented to person, place, and time.        ED Treatments / Results  Labs (all labs ordered are listed, but only abnormal results are displayed) Labs Reviewed  COMPREHENSIVE METABOLIC PANEL - Abnormal; Notable for the following components:  Result Value   Sodium 133 (*)    CO2 18 (*)    Glucose, Bld 115 (*)    BUN 64 (*)    Creatinine, Ser 4.22 (*)    Total Protein 6.4 (*)    Albumin 3.3 (*)    AST 13 (*)    GFR calc non Af Amer 10 (*)    GFR calc Af Amer 11 (*)    All other components within normal limits  CBC WITH DIFFERENTIAL/PLATELET - Abnormal; Notable for the following components:   RBC 2.73 (*)    Hemoglobin 7.9 (*)    HCT 24.8 (*)    All other components within normal limits  URINALYSIS, ROUTINE W REFLEX MICROSCOPIC - Abnormal; Notable for the following components:   APPearance TURBID (*)    Hgb urine dipstick SMALL (*)    Protein, ur 30 (*)    Leukocytes,Ua LARGE (*)    WBC, UA >50 (*)    Bacteria, UA FEW (*)    Non Squamous Epithelial 0-5 (*)    All other components within normal limits  BRAIN NATRIURETIC PEPTIDE    EKG None  Radiology Dg Chest 2 View  Result Date: 02/10/2019 CLINICAL DATA:  Acute onset of bilateral lower extremity edema. EXAM: CHEST - 2 VIEW COMPARISON:  Chest radiograph performed 01/16/2019. FINDINGS: The lungs are well-aerated and clear. There is no evidence of focal opacification, pleural effusion or pneumothorax. The heart is mildly enlarged. A pacemaker is noted at the left chest wall, with leads ending at the right atrium and right ventricle. No acute osseous abnormalities are seen. IMPRESSION: Mild cardiomegaly. Lungs remain grossly clear. Electronically Signed   By: Garald Balding M.D.   On: 02/10/2019 17:43   Ct Renal Stone Study  Result Date: 02/10/2019 CLINICAL DATA:  Bilateral leg swelling since coming off of Lasix. Erythema of the left leg also noted. EXAM: CT ABDOMEN AND PELVIS WITHOUT CONTRAST TECHNIQUE: Multidetector CT imaging of the abdomen and pelvis was performed following the standard protocol without IV contrast. COMPARISON:  01/19/2019 FINDINGS: Lower chest: Noncalcified masslike opacity in the lingula is identified measuring 16 x 14 mm, series 7/. Bibasilar  dependent atelectasis is identified. Possible additional tiny nodule in posterior right lower lobe measuring 3 mm. Left main and three-vessel coronary arteriosclerosis. Right-sided pacer leads are present. No significant pericardial effusion or thickening. Small hiatal hernia. Hepatobiliary: The unenhanced liver is unremarkable without apparent mass or biliary dilatation given limitations of a noncontrast study. Gallbladder contains layering sludge and/or calculi. No secondary signs of acute cholecystitis. A calcification is noted in the duodenum possibly representing a distal common bowel duct stone measuring 6 mm. Correlate with liver function tests. If necessary, MRCP or ERCP may help further assessment, series 3/39. Pancreas: Atrophic without inflammation, ductal dilatation or mass. Spleen: No splenomegaly Adrenals/Urinary Tract: Normal bilateral adrenal glands. A 5 mm nonobstructing left upper pole renal calculus is identified. At least 4-5 renal stones are noted in the interpolar aspect of the right kidney, the largest measuring up to approximately 4 mm with moderate to marked dilatation of the right renal collecting system. Right-sided urothelial thickening is noted the level of the urinary bladder. The bladder is also thickened in appearance some which is due to underdistention but cystitis is also possibility. No definite ureteral stones are identified. Stomach/Bowel: Small hiatal hernia. Physiologic distention of the stomach with normal small bowel rotation. No small bowel obstruction are inflammation. Moderate to marked stool retention throughout the colon consistent with constipation.  Appendix appears normal. Vascular/Lymphatic: Aortic atherosclerosis. No enlarged abdominal or pelvic lymph nodes. Reproductive: Uterus and bilateral adnexa are unremarkable. Other: Presacral edema.  No free air nor free fluid. Musculoskeletal: Lumbar spondylosis most marked at L4-5 with discogenic sclerosis. IMPRESSION: 1.  **An incidental finding of potential clinical significance has been found. Lingular masslike abnormality measuring 16 x 14 mm, partially included on this study. Dedicated chest CT is recommended. Findings raise concern for pulmonary neoplasm. Additional tiny 3 mm nodule in the subpleural right lower lobe is indeterminate for neoplasm versus post inflammatory/infectious change.** 2. Bilateral renal calculi are identified without obstructive uropathy. Moderate-to-marked chronic dilatation of the right renal collecting system with diffuse urothelial and bladder wall thickening raise concern for stigmata urinary tract infection. Correlate. 3. Stool is noted throughout the colon consistent with constipation. 4. 6 mm calcification in the region of the second portion of the duodenum may represent choledocholithiasis. Correlate with liver function tests. Additional layering gallstones and calculi without secondary signs of cholecystitis are noted within the gallbladder. Electronically Signed   By: Ashley Royalty M.D.   On: 02/10/2019 20:54    Procedures Procedures (including critical care time)  Medications Ordered in ED Medications  sodium chloride flush (NS) 0.9 % injection 3 mL (3 mLs Intravenous Not Given 02/10/19 2029)     Initial Impression / Assessment and Plan / ED Course  I have reviewed the triage vital signs and the nursing notes.  Pertinent labs & imaging results that were available during my care of the patient were reviewed by me and considered in my medical decision making (see chart for details).      Patient with a previous medical history of CKD stage IV, hypertension, hypothyroid, anemia, diabetes presents to the ED with chief complaint of bilateral leg swelling.  Patient reports changes in her Lasix.  Seen by her PCP about 5 days ago and reports she was given another medication to pull fluid from her legs, she reports no improvement.  Attempted to see her PCP this morning however was  unsuccessful and was advised to seek treatment in the emergency department.  He also endorses shortness of breath. CMP showed slight decrease in sodium, creatinine has doubled from her last visit.  BUN is also decreased at 3.3.  BNP was stable.  CBC showed no leukocytosis, hemoglobin slightly decreased but consistent with her previous visit.  Chest x-ray showed: Mild cardiomegaly. Lungs remain grossly clear.     Patient did have a bladder instillation yesterday, due to frequency.  UA does show large leukocytes, greater than 50 white blood cells, few bacteria.  She also reports worsening swelling to bilateral legs, after changes were made to her Lasix.  There is also some weeping present along the left leg. Some suspicion for beginning of cellulitis. I have discussed patient with my attending, due to patients AKI along with venous statis likely cellulitis will place place for hospitalist admission.   Per hospitalist admission recommended CT renal to rule out any obstruction.  CT renal showed: 1. **An incidental finding of potential clinical significance has  been found. Lingular masslike abnormality measuring 16 x 14 mm,  partially included on this study. Dedicated chest CT is recommended.  Findings raise concern for pulmonary neoplasm. Additional tiny 3 mm  nodule in the subpleural right lower lobe is indeterminate for  neoplasm versus post inflammatory/infectious change.**  2. Bilateral renal calculi are identified without obstructive  uropathy. Moderate-to-marked chronic dilatation of the right renal  collecting system with diffuse  urothelial and bladder wall  thickening raise concern for stigmata urinary tract infection.  Correlate.  3. Stool is noted throughout the colon consistent with constipation.  4. 6 mm calcification in the region of the second portion of the  duodenum may represent choledocholithiasis. Correlate with liver  function tests. Additional layering gallstones and calculi  without  secondary signs of cholecystitis are noted within the gallbladder.     Placed a call to hospitalist admission, I have also discussed the results of her CAT scan with patient.  Patient understands and agrees with management.  She will be transported upstairs as soon as beds become available.     Final Clinical Impressions(s) / ED Diagnoses   Final diagnoses:  Peripheral edema  Acute renal failure, unspecified acute renal failure type [facility])  Pulmonary neoplasm    ED Discharge Orders    None       Janeece Fitting, Hershal Coria 02/10/19 2120    Charlesetta Shanks, MD 02/13/19 1337

## 2019-02-11 ENCOUNTER — Other Ambulatory Visit: Payer: Self-pay

## 2019-02-11 LAB — CBC
HCT: 25 % — ABNORMAL LOW (ref 36.0–46.0)
Hemoglobin: 8.2 g/dL — ABNORMAL LOW (ref 12.0–15.0)
MCH: 29.2 pg (ref 26.0–34.0)
MCHC: 32.8 g/dL (ref 30.0–36.0)
MCV: 89 fL (ref 80.0–100.0)
Platelets: 225 10*3/uL (ref 150–400)
RBC: 2.81 MIL/uL — ABNORMAL LOW (ref 3.87–5.11)
RDW: 14.1 % (ref 11.5–15.5)
WBC: 4.7 10*3/uL (ref 4.0–10.5)
nRBC: 0 % (ref 0.0–0.2)

## 2019-02-11 LAB — PROTEIN / CREATININE RATIO, URINE
Creatinine, Urine: 68.49 mg/dL
Protein Creatinine Ratio: 1.71 mg/mg{Cre} — ABNORMAL HIGH (ref 0.00–0.15)
TOTAL PROTEIN, URINE: 117 mg/dL

## 2019-02-11 LAB — BASIC METABOLIC PANEL
Anion gap: 12 (ref 5–15)
BUN: 61 mg/dL — ABNORMAL HIGH (ref 8–23)
CO2: 19 mmol/L — AB (ref 22–32)
Calcium: 9.6 mg/dL (ref 8.9–10.3)
Chloride: 105 mmol/L (ref 98–111)
Creatinine, Ser: 3.81 mg/dL — ABNORMAL HIGH (ref 0.44–1.00)
GFR calc non Af Amer: 11 mL/min — ABNORMAL LOW (ref >60)
GFR, EST AFRICAN AMERICAN: 13 mL/min — AB (ref >60)
Glucose, Bld: 117 mg/dL — ABNORMAL HIGH (ref 70–99)
Potassium: 3.2 mmol/L — ABNORMAL LOW (ref 3.5–5.1)
Sodium: 136 mmol/L (ref 135–145)

## 2019-02-11 LAB — GLUCOSE, CAPILLARY
Glucose-Capillary: 112 mg/dL — ABNORMAL HIGH (ref 70–99)
Glucose-Capillary: 111 mg/dL — ABNORMAL HIGH (ref 70–99)
Glucose-Capillary: 149 mg/dL — ABNORMAL HIGH (ref 70–99)
Glucose-Capillary: 114 mg/dL — ABNORMAL HIGH (ref 70–99)
Glucose-Capillary: 184 mg/dL — ABNORMAL HIGH (ref 70–99)

## 2019-02-11 LAB — SODIUM, URINE, RANDOM: Sodium, Ur: 82 mmol/L

## 2019-02-11 LAB — CREATININE, URINE, RANDOM: Creatinine, Urine: 28.31 mg/dL

## 2019-02-11 MED ORDER — SENNOSIDES-DOCUSATE SODIUM 8.6-50 MG PO TABS
1.0000 | ORAL_TABLET | Freq: Two times a day (BID) | ORAL | Status: DC
  Administered 2019-02-11 – 2019-02-19 (×17): 1 via ORAL
  Filled 2019-02-11 (×17): qty 1

## 2019-02-11 MED ORDER — SODIUM CHLORIDE 0.9 % IV SOLN
1.0000 g | INTRAVENOUS | Status: DC
  Administered 2019-02-11 – 2019-02-13 (×3): 1 g via INTRAVENOUS
  Filled 2019-02-11 (×4): qty 10

## 2019-02-11 MED ORDER — POLYETHYLENE GLYCOL 3350 17 G PO PACK
17.0000 g | PACK | Freq: Every day | ORAL | Status: DC
  Administered 2019-02-11 – 2019-02-19 (×8): 17 g via ORAL
  Filled 2019-02-11 (×8): qty 1

## 2019-02-11 MED ORDER — FESOTERODINE FUMARATE ER 4 MG PO TB24
4.0000 mg | ORAL_TABLET | Freq: Every day | ORAL | Status: DC
  Administered 2019-02-12 – 2019-02-19 (×8): 4 mg via ORAL
  Filled 2019-02-11 (×8): qty 1

## 2019-02-11 MED ORDER — POTASSIUM CHLORIDE CRYS ER 20 MEQ PO TBCR: 40.0000 meq | EXTENDED_RELEASE_TABLET | Freq: Once | ORAL | Status: DC

## 2019-02-11 MED ORDER — POTASSIUM CHLORIDE CRYS ER 20 MEQ PO TBCR
20.0000 meq | EXTENDED_RELEASE_TABLET | Freq: Once | ORAL | Status: AC
  Administered 2019-02-11: 20 meq via ORAL
  Filled 2019-02-11: qty 1

## 2019-02-11 MED ORDER — FUROSEMIDE 10 MG/ML IJ SOLN
80.0000 mg | Freq: Two times a day (BID) | INTRAMUSCULAR | Status: DC
  Administered 2019-02-11 – 2019-02-13 (×4): 80 mg via INTRAVENOUS
  Filled 2019-02-11 (×5): qty 8

## 2019-02-11 NOTE — Consult Note (Signed)
Erika Fry Admit Date: 02/10/2019 02/11/2019 Rexene Agent Requesting Physician:  Marlowe Sax MD  Reason for Consult:  AoCKD4, LEE / Hypervolemia HPI:  73 year old female with recently complex medical history who presented overnight with progressive lower extremity edema and found to have worsening renal function.  Patient was admitted to the hospital last month through 2/28 after undergoing a right sided lithotripsy, stone extraction, ureteral stent placement, and hydrodistention therapy for chronic pelvic pain.  She was having progressive renal failure with a peak serum creatinine of 6, after urological interventions and with hydration her renal function improved and on discharge it was 2.54.  She also was worked up for fevers of unknown origin, eventually resolving, followed by infectious diseases.  Final thoughts were that it was related to her urological interventions.  She was discharged home with close family support, she lives alone.  Patient followed up with Dr. Johnney Ou on 3/4.  Her weight was 195 pounds.  I was a 20 pound weight gain and she complained of edema in the legs at that time.  Plan was to torsemide 80 mg twice daily for 5 days then to go to once daily torsemide.  Patient is a poor historian, I am not certain if this was done.  She also has poor dietary recall but does recall some restaurant and fast food.  It appears patient had hydrodistention therapy again on 3/10 after seeing Dr. Johnney Ou and before presentation.  Presenting labs with creatinine worsened to 4.22, 3.8 this morning.  Potassium is 3.2, bicarbonate 19, BUN 61 this morning.  Continues to have anemia, today's value hemoglobin 8.2.  Discussed use of ESA as an outpatient, deferred at that time.   Urine analysis at presentation with trace protein, no hemoglobin.  Urine sodium was 82.  2 view chest x-ray with no pulmonary edema.  CT renal stone demonstrated bilateral nonobstructing stones.  Right-sided mild to moderate  dilatation of the collecting system was noted again.  She also had diffuse urothelial and bladder wall thickening.  Mass in the lingula was identified.  PMH Incudes:  Type 2 diabetes  Slow upper GI bleed and status post EGD with removal of gastric polyp in February of this year   Creat (mg/dL)  Date Value  12/21/2018 3.24 (H)  09/14/2018 2.81 (H)  08/17/2018 2.84 (H)   Creatinine, Ser (mg/dL)  Date Value  02/11/2019 3.81 (H)  02/10/2019 4.22 (H)  01/29/2019 2.54 (H)  01/28/2019 2.70 (H)  01/27/2019 2.55 (H)  01/26/2019 2.65 (H)  01/25/2019 2.54 (H)  01/24/2019 2.80 (H)  01/23/2019 2.85 (H)  01/22/2019 2.96 (H)  ] I/Os: I/O last 3 completed shifts: In: 300 [P.O.:300] Out: 775 [Urine:775]   ROS NSAIDS: Denies use IV Contrast no exposure TMP/SMX no exposure Hypotension not present Balance of 12 systems is negative w/ exceptions as above  PMH  Past Medical History:  Diagnosis Date  . Arthritis    "knees, thumbs" (03/25/2018)  . CKD (chronic kidney disease), stage IV ([place])   . Diet-controlled diabetes mellitus ([place])   . GERD (gastroesophageal reflux disease)   . Gout    "on daily RX" (03/25/2018)  . Heart murmur   . High cholesterol   . Hypertension   . Hypothyroidism   . Iron deficiency anemia    "had to get an iron infusion"  . Migraine    "used to have them growing up" (03/25/2018)  . Presence of permanent cardiac pacemaker 03/25/2018   PSH  Past Surgical History:  Procedure Laterality Date  . APPENDECTOMY    . BIOPSY  05/19/2018   Procedure: BIOPSY;  Surgeon: Ladene Artist, MD;  Location: [facility];  Service: Endoscopy;;  . Consuela Mimes W/ RETROGRADES Bilateral 01/14/2019   Procedure: CYSTOSCOPY WITH RETROGRADE PYELOGRAM BILATERAL HYDRODISTENTION;  Surgeon: Ardis Hughs, MD;  Location: [facility];  Service: Urology;  Laterality: Bilateral;  . ESOPHAGOGASTRODUODENOSCOPY N/A 05/19/2018   Procedure: ESOPHAGOGASTRODUODENOSCOPY (EGD);  Surgeon: Ladene Artist, MD;  Location: [facility];  Service: Endoscopy;  Laterality: N/A;  . ESOPHAGOGASTRODUODENOSCOPY (EGD) WITH PROPOFOL N/A 01/28/2019   Procedure: ESOPHAGOGASTRODUODENOSCOPY (EGD) WITH PROPOFOL;  Surgeon: Rush Landmark Telford Nab., MD;  Location: [place];  Service: Gastroenterology;  Laterality: N/A;  . INSERT / REPLACE / REMOVE PACEMAKER  03/25/2018  . IR FLUORO GUIDE CV LINE RIGHT  05/18/2018  . IR LUMBAR DISC ASPIRATION W/IMG GUIDE  05/15/2018  . IR REMOVAL TUN CV CATH W/O FL  07/23/2018  . IR US GUIDE VASC ACCESS RIGHT  05/18/2018  . KNEE ARTHROSCOPY Bilateral   . PACEMAKER IMPLANT N/A 03/25/2018   Procedure: PACEMAKER IMPLANT;  Surgeon: Evans Lance, MD;  Location: [place] CV [facility];  Service: Cardiovascular;  Laterality: N/A;  . PACEMAKER PLACEMENT Left 03/2018  . POLYPECTOMY  01/28/2019   Procedure: POLYPECTOMY;  Surgeon: Mansouraty, Telford Nab., MD;  Location: [place];  Service: Gastroenterology;;  . RIGHT HEART CATH N/A 03/20/2018   Procedure: RIGHT HEART CATH;  Surgeon: Nigel Mormon, MD;  Location: [place] CV [facility];  Service: Cardiovascular;  Laterality: N/A;  . TEE WITHOUT CARDIOVERSION N/A 03/20/2018   Procedure: TRANSESOPHAGEAL ECHOCARDIOGRAM (TEE);  Surgeon: Nigel Mormon, MD;  Location: [facility];  Service: Cardiovascular;  Laterality: N/A;  . TONSILLECTOMY    . URETEROSCOPY WITH HOLMIUM LASER LITHOTRIPSY Right 01/14/2019   Procedure: URETEROSCOPY WITH HOLMIUM LASER LITHOTRIPSY BLADDER BIOPSIES RIGHT STENT PLACEMENT;  Surgeon: Ardis Hughs, MD;  Location: [facility];  Service: Urology;  Laterality: Right;   FH  Family History  Problem Relation Age of Onset  . Hypertension Mother   . Diabetes Mellitus II Father   . Heart disease Father   . Gastric cancer Brother   . Diabetes Mellitus II Brother    SH  reports that she has never smoked. She has never used smokeless tobacco. She reports previous alcohol use. She reports that she does not  use drugs. Allergies  Allergies  Allergen Reactions  . Baclofen Other (See Comments)    somnolence  . Codeine Other (See Comments)    Increases Pain and couldn't sleep   Home medications Prior to Admission medications   Medication Sig Start Date End Date Taking? Authorizing Provider  allopurinol (ZYLOPRIM) 100 MG tablet Take 0.5 tablets (50 mg total) by mouth daily. 01/29/19  Yes Elodia Florence., MD  Ascorbic Acid (VITAMIN C PO) Take 1 tablet by mouth 2 (two) times daily.   Yes [provider]  Biotin 5 MG CAPS Take 1 capsule (5 mg total) by mouth daily. 01/14/19  Yes Ardis Hughs, MD  cetirizine (ZYRTEC) 10 MG tablet Take 10 mg by mouth at bedtime.    Yes [provider]  diazepam (VALIUM) 10 MG tablet 10 mg See admin instructions. Insert 10 mg vaginally at bedtime   Yes [provider]  ferrous sulfate 325 (65 FE) MG tablet Take 1 tablet (325 mg total) by mouth 2 (two) times daily with a meal. 06/22/18  Yes Love, Ivan Anchors, PA-C  fesoterodine (TOVIAZ)  4 MG TB24 tablet Take 4 mg by mouth at bedtime.   Yes [provider]  fluticasone (FLONASE) 50 MCG/ACT nasal spray Place 2 sprays into both nostrils daily as needed for allergies or rhinitis.   Yes [provider]  folic acid (FOLVITE) 1 MG tablet Take 1 tablet (1 mg total) by mouth daily. 06/22/18  Yes Love, Ivan Anchors, PA-C  gabapentin (NEURONTIN) 300 MG capsule Take 300 mg by mouth See admin instructions. Take one capsule on day 1, then 2 capsules day 2 then take 3 capsules thereafter 02/09/19  Yes [provider]  levothyroxine (SYNTHROID, LEVOTHROID) 75 MCG tablet Take 75 mcg by mouth daily before breakfast.   Yes [provider]  Lidocaine 5 % CREA Apply 1 application topically as needed (for pain).   Yes [provider]  mirabegron ER (MYRBETRIQ) 50 MG TB24 tablet Take 50 mg by mouth daily.   Yes [provider]  Multiple Vitamin (MULTI-VITAMIN PO)  Take 1 tablet by mouth daily.   Yes [provider]  oxyCODONE-acetaminophen (PERCOCET/ROXICET) 5-325 MG tablet Take 1 tablet by mouth 2 (two) times daily. 02/09/19  Yes [provider]  pantoprazole (PROTONIX) 40 MG tablet Take 1 tablet (40 mg total) by mouth 2 (two) times daily for 30 days. (take once daily after 1 month) 01/29/19 02/28/19 Yes Elodia Florence., MD  simvastatin (ZOCOR) 40 MG tablet Take 0.5 tablets (20 mg total) by mouth at bedtime. Patient taking differently: Take 40 mg by mouth at bedtime.  06/22/18  Yes Love, Ivan Anchors, PA-C  solifenacin (VESICARE) 5 MG tablet Take 5 mg by mouth daily. 02/04/19  Yes [provider]  tamsulosin (FLOMAX) 0.4 MG CAPS capsule Take 1 capsule (0.4 mg total) by mouth daily for 30 days. 01/29/19 02/28/19 Yes Elodia Florence., MD  vitamin B-12 (CYANOCOBALAMIN) 1000 MCG tablet Take 1 tablet (1,000 mcg total) by mouth daily. 06/22/18  Yes Love, Ivan Anchors, PA-C  oxycodone (OXY-IR) 5 MG capsule Take 1-2 capsules (5-10 mg total) by mouth every 4 (four) hours as needed. Patient not taking: Reported on 02/10/2019 01/14/19   Ardis Hughs, MD    Current Medications Scheduled Meds: . allopurinol  50 mg Oral Daily  . darifenacin  7.5 mg Oral Daily  . ferrous sulfate  325 mg Oral BID WC  . fesoterodine  4 mg Oral QHS  . folic acid  1 mg Oral Daily  . heparin  5,000 Units Subcutaneous Q8H  . insulin aspart  0-9 Units Subcutaneous TID WC  . levothyroxine  75 mcg Oral Q0600  . mirabegron ER  50 mg Oral Daily  . pantoprazole  40 mg Oral BID  . potassium chloride  20 mEq Oral Once  . simvastatin  40 mg Oral QHS  . sodium chloride flush  3 mL Intravenous Once  . tamsulosin  0.4 mg Oral Daily  . vitamin B-12  1,000 mcg Oral Daily   Continuous Infusions: PRN Meds:.acetaminophen **OR** acetaminophen, ondansetron **OR** ondansetron (ZOFRAN) IV  CBC Recent Labs  Lab 02/10/19 1205 02/11/19 0459  WBC 5.1 4.7  NEUTROABS 3.0   --   HGB 7.9* 8.2*  HCT 24.8* 25.0*  MCV 90.8 89.0  PLT 218 048   Basic Metabolic Panel Recent Labs  Lab 02/10/19 1205 02/11/19 0459  NA 133* 136  K 3.5 3.2*  CL 103 105  CO2 18* 19*  GLUCOSE 115* 117*  BUN 64* 61*  CREATININE 4.22* 3.81*  CALCIUM  10.0 9.6    Physical Exam  Blood pressure 125/60, pulse 89, temperature (!) 100.5 F (38.1 C), temperature source Oral, resp. rate 18, SpO2 96 %. GEN: NAD, elderly female, lying in bed ENT: NCAT EYES: EOMI, glasses on, sclera anicteric CV: RRR, normal S1 and S2, no murmur or rub PULM: CTA B, normal work of breathing, no crackles ABD: Obese, soft, nontender SKIN: Bilateral symmetric erythema in the distal lower extremities consistent with chronic venous stasis EXT: 4+ edema extending to the knees, pitting   Assessment 73 year old female presenting with AoCKD4, hypervolemia with LEE.  Patient with chronic urological issues including chronic pain, chronic right-sided hydronephrosis, recent hydrodistention of the bladder on 3/10, recent ureteral stent on the right side subsequently removed.  She was also found to have potential lingular mass needing further work-up.  Urine analysis without proteinuria or hematuria, chronic pyuria present.  Historically with a 3 albuminuria from 09/2018 and evaluate 450.  Most likely her edema is related to dietary intake in excess of her ability to excrete sodium.  No immediate explanation for her worsening of renal function, we might need to involve urology.  Though it is rather drastic I wonder if she would benefit from a right-sided nephrostomy tube.  1. AoCKD4, nonoliguric, SCr last month 2.6; now 3.8.  Chronic urological issues, stones, chrpnic pain; chronic R sided HN 2. Hypervolemia, 4+LEE, hx/o A3 albuminuria, UA with 1+ protein 3. Hx/o neg SPEP 2018, abnormal UPEP 01/2019 4. Anemia, Hb stable in 8s 5. DM2 6. HTN 7. ? Lingular mass, w/u per primary 8. Low grade fever, no ABX, B and UCx  pending  Plan 1. Lasix 80 IV BID 2. Rpt SPEP and check SFLC 3. UP/C 4. If renal function worsens will notify urology and discuss nephrostomy tube 5. Daily weights, Daily Renal Panel, Strict I/Os, Avoid nephrotoxins (NSAIDs, judicious IV Contrast) 6. Will follow along closely   Pearson Grippe MD [phone] [phone] 02/11/2019, 12:31 PM

## 2019-02-11 NOTE — Progress Notes (Signed)
[facility] Progress Note  Patient: Erika Fry XIP:[id number]   PCP: [name], MD DOB: 15-May-1946   DOA: 02/10/2019   DOS: 02/11/2019   Date of Service: the patient was seen and examined on 02/11/2019  Brief hospital course: Pt. with PMH of CKD, DM; admitted on 02/10/2019, presented with complaint of swelling of the leg, was found to have volume overload secondary to acute on chronic diastolic CHF. Currently further plan is continue IV Lasix.  Subjective: Feeling about the same.  Still reports swelling of the leg unchanged. Reports that the redness in the leg is new for last 48 hours.  No fever no chills at home but mildly febrile today.  Assessment and Plan: 1.  Bilateral pedal edema Acute on chronic diastolic CHF Essential hypertension Continue home blood pressure medication. We will provide IV Lasix and monitor response. Does not appear to be having any DVT right now.  2.  Acute on chronic kidney disease stage IV. Renal function significantly worsened from baseline. Suspect cardiorenal hemodynamics. Continue with IV Lasix and monitor. Appreciate nephrology consultation. Suspect that the patient actually has chronic hydronephrosis which might be the reason why she has worsening of renal function and therefore will get urology to monitor the patient.  3.  Cellulitis of bilateral lower extremity. Treat with IV ceftriaxone.  4.  Possible UTI. Follow-up on culture.  On ceftriaxone  5.  Interstitial cystitis. Chronic pelvic pain. Follows up with Dr. Louis Meckel urology. Recently had a bladder instillation. On 3 different medication for overactive bladder. We will currently only continue to.  6. Type 2 Diabetes Mellitus, uncontroled with renal complication Diet controlled. Monitor.  6. Anemia chronic kidney disease. H&H stable. Monitor.     Component Value Date/Time   HGB 8.2 (L) 02/11/2019 0459   HCT 25.0 (L) 02/11/2019 0459   HCT 20.1 (L) 05/13/2018  0427    7.  Obesity Body mass index is 32.1 kg/m.  Dietary consultation  8.  Pressure ulcer.  Bilateral buttocks.  Left thigh.  POA.  Pressure Injury 01/17/19 Stage I -  Intact skin with non-blanchable redness of a localized area usually over a bony prominence. (Active)  01/17/19 0230  Location: Buttocks  Location Orientation:   Staging: Stage I -  Intact skin with non-blanchable redness of a localized area usually over a bony prominence.  Wound Description (Comments):   Present on Admission:      Pressure Injury 02/11/19 Stage II -  Partial thickness loss of dermis presenting as a shallow open ulcer with a red, pink wound bed without slough. LEFT inner thigh-looks like a healed wound and has opened from moisture (a gash) (Active)  02/11/19 1600  Location: Thigh  Location Orientation: Anterior;Left  Staging: Stage II -  Partial thickness loss of dermis presenting as a shallow open ulcer with a red, pink wound bed without slough.  Wound Description (Comments): LEFT inner thigh-looks like a healed wound and has opened from moisture (a gash)  Present on Admission: Yes     Diet: renal diet DVT Prophylaxis: subcutaneous Heparin  Advance goals of care discussion: full code  Family Communication: no family was present at bedside, at the time of interview.   Disposition:  Discharge to home.  Consultants: nephrology Procedures: none  Scheduled Meds: . allopurinol  50 mg Oral Daily  . ferrous sulfate  325 mg Oral BID WC  . [START ON 02/12/2019] fesoterodine  4 mg Oral Daily  . folic acid  1 mg Oral Daily  .  furosemide  80 mg Intravenous BID  . heparin  5,000 Units Subcutaneous Q8H  . insulin aspart  0-9 Units Subcutaneous TID WC  . levothyroxine  75 mcg Oral Q0600  . mirabegron ER  50 mg Oral Daily  . pantoprazole  40 mg Oral BID  . polyethylene glycol  17 g Oral Daily  . senna-docusate  1 tablet Oral BID  . simvastatin  40 mg Oral QHS  . sodium chloride flush  3 mL  Intravenous Once  . tamsulosin  0.4 mg Oral Daily  . vitamin B-12  1,000 mcg Oral Daily   Continuous Infusions: . cefTRIAXone (ROCEPHIN)  IV 1 g (02/11/19 1423)   PRN Meds: acetaminophen **OR** acetaminophen, ondansetron **OR** ondansetron (ZOFRAN) IV Antibiotics: Anti-infectives (From admission, onward)   Start     Dose/Rate Route Frequency Ordered Stop   02/11/19 1300  cefTRIAXone (ROCEPHIN) 1 g in sodium chloride 0.9 % 100 mL IVPB    Note to Pharmacy:  And UTI   1 g 200 mL/hr over 30 Minutes Intravenous Every 24 hours 02/11/19 1247         Objective: Physical Exam: Vitals:   02/11/19 0908 02/11/19 1231 02/11/19 1615 02/11/19 1701  BP: 125/60  (!) 116/57   Pulse: 89  87   Resp: 18  20   Temp: (!) 100.5 F (38.1 C) 99.6 F (37.6 C) 99.1 F (37.3 C)   TempSrc: Oral Oral Oral   SpO2: 96%  95%   Weight:    87.5 kg  Height:    5\' 5"  (1.651 m)    Intake/Output Summary (Last 24 hours) at 02/11/2019 2020 Last data filed at 02/11/2019 1700 Gross per 24 hour  Intake 1238 ml  Output 1475 ml  Net -237 ml   Filed Weights   02/11/19 1701  Weight: 87.5 kg   General: Alert, Awake and Oriented to Time, Place and Person. Appear in mild distress, affect appropriate Eyes: PERRL, Conjunctiva normal ENT: Oral Mucosa clear moist. Neck: no JVD, no Abnormal Mass Or lumps Cardiovascular: S1 and S2 Present, aortic systolic  Murmur, Peripheral Pulses Present Respiratory: normal respiratory effort, Bilateral Air entry equal and Decreased, no use of accessory muscle, Clear to Auscultation, no Crackles, no wheezes Abdomen: Bowel Sound present, Soft and no tenderness, no hernia Skin: bilateral leg redness, no Rash, no induration Extremities: bilateral  Pedal edema, no calf tenderness Neurologic: Grossly no focal neuro deficit. Bilaterally Equal motor strength  Data Reviewed: CBC: Recent Labs  Lab 02/10/19 1205 02/11/19 0459  WBC 5.1 4.7  NEUTROABS 3.0  --   HGB 7.9* 8.2*  HCT 24.8*  25.0*  MCV 90.8 89.0  PLT 218 578   Basic Metabolic Panel: Recent Labs  Lab 02/10/19 1205 02/11/19 0459  NA 133* 136  K 3.5 3.2*  CL 103 105  CO2 18* 19*  GLUCOSE 115* 117*  BUN 64* 61*  CREATININE 4.22* 3.81*  CALCIUM 10.0 9.6    Liver Function Tests: Recent Labs  Lab 02/10/19 1205  AST 13*  ALT 8  ALKPHOS 101  BILITOT 0.5  PROT 6.4*  ALBUMIN 3.3*   No results for input(s): LIPASE, AMYLASE in the last 168 hours. No results for input(s): AMMONIA in the last 168 hours. Coagulation Profile: No results for input(s): INR, PROTIME in the last 168 hours. Cardiac Enzymes: No results for input(s): CKTOTAL, CKMB, CKMBINDEX, TROPONINI in the last 168 hours. BNP (last 3 results) No results for input(s): PROBNP in the last 8760 hours. CBG:  Recent Labs  Lab 02/10/19 2245 02/11/19 0729 02/11/19 1231 02/11/19 1615  GLUCAP 112* 111* 149* 114*   Studies: Ct Renal Stone Study  Result Date: 02/10/2019 CLINICAL DATA:  Bilateral leg swelling since coming off of Lasix. Erythema of the left leg also noted. EXAM: CT ABDOMEN AND PELVIS WITHOUT CONTRAST TECHNIQUE: Multidetector CT imaging of the abdomen and pelvis was performed following the standard protocol without IV contrast. COMPARISON:  01/19/2019 FINDINGS: Lower chest: Noncalcified masslike opacity in the lingula is identified measuring 16 x 14 mm, series 7/. Bibasilar dependent atelectasis is identified. Possible additional tiny nodule in posterior right lower lobe measuring 3 mm. Left main and three-vessel coronary arteriosclerosis. Right-sided pacer leads are present. No significant pericardial effusion or thickening. Small hiatal hernia. Hepatobiliary: The unenhanced liver is unremarkable without apparent mass or biliary dilatation given limitations of a noncontrast study. Gallbladder contains layering sludge and/or calculi. No secondary signs of acute cholecystitis. A calcification is noted in the duodenum possibly representing a  distal common bowel duct stone measuring 6 mm. Correlate with liver function tests. If necessary, MRCP or ERCP may help further assessment, series 3/39. Pancreas: Atrophic without inflammation, ductal dilatation or mass. Spleen: No splenomegaly Adrenals/Urinary Tract: Normal bilateral adrenal glands. A 5 mm nonobstructing left upper pole renal calculus is identified. At least 4-5 renal stones are noted in the interpolar aspect of the right kidney, the largest measuring up to approximately 4 mm with moderate to marked dilatation of the right renal collecting system. Right-sided urothelial thickening is noted the level of the urinary bladder. The bladder is also thickened in appearance some which is due to underdistention but cystitis is also possibility. No definite ureteral stones are identified. Stomach/Bowel: Small hiatal hernia. Physiologic distention of the stomach with normal small bowel rotation. No small bowel obstruction are inflammation. Moderate to marked stool retention throughout the colon consistent with constipation. Appendix appears normal. Vascular/Lymphatic: Aortic atherosclerosis. No enlarged abdominal or pelvic lymph nodes. Reproductive: Uterus and bilateral adnexa are unremarkable. Other: Presacral edema.  No free air nor free fluid. Musculoskeletal: Lumbar spondylosis most marked at L4-5 with discogenic sclerosis. IMPRESSION: 1. **An incidental finding of potential clinical significance has been found. Lingular masslike abnormality measuring 16 x 14 mm, partially included on this study. Dedicated chest CT is recommended. Findings raise concern for pulmonary neoplasm. Additional tiny 3 mm nodule in the subpleural right lower lobe is indeterminate for neoplasm versus post inflammatory/infectious change.** 2. Bilateral renal calculi are identified without obstructive uropathy. Moderate-to-marked chronic dilatation of the right renal collecting system with diffuse urothelial and bladder wall  thickening raise concern for stigmata urinary tract infection. Correlate. 3. Stool is noted throughout the colon consistent with constipation. 4. 6 mm calcification in the region of the second portion of the duodenum may represent choledocholithiasis. Correlate with liver function tests. Additional layering gallstones and calculi without secondary signs of cholecystitis are noted within the gallbladder. Electronically Signed   By: Ashley Royalty M.D.   On: 02/10/2019 20:54     Time spent: 35 minutes  Author: Berle Mull, MD [facility] 02/11/2019 8:20 PM  To reach On-call, see care teams to locate the attending and reach out to them via [email or web address]. If 7PM-7AM, please contact night-coverage If you still have difficulty reaching the attending provider, please page the [facility] (Director on Call) for [facility] on amion for assistance.

## 2019-02-12 LAB — BASIC METABOLIC PANEL
Anion gap: 17 — ABNORMAL HIGH (ref 5–15)
BUN: 60 mg/dL — ABNORMAL HIGH (ref 8–23)
CO2: 16 mmol/L — ABNORMAL LOW (ref 22–32)
Calcium: 9.6 mg/dL (ref 8.9–10.3)
Chloride: 102 mmol/L (ref 98–111)
Creatinine, Ser: 3.77 mg/dL — ABNORMAL HIGH (ref 0.44–1.00)
GFR calc Af Amer: 13 mL/min — ABNORMAL LOW (ref >60)
GFR calc non Af Amer: 11 mL/min — ABNORMAL LOW (ref >60)
Glucose, Bld: 143 mg/dL — ABNORMAL HIGH (ref 70–99)
POTASSIUM: 3 mmol/L — AB (ref 3.5–5.1)
Sodium: 135 mmol/L (ref 135–145)

## 2019-02-12 LAB — KAPPA/LAMBDA LIGHT CHAINS
Kappa free light chain: 83.4 mg/L — ABNORMAL HIGH (ref 3.3–19.4)
Kappa, lambda light chain ratio: 2.13 — ABNORMAL HIGH (ref 0.26–1.65)
Lambda free light chains: 39.2 mg/L — ABNORMAL HIGH (ref 5.7–26.3)

## 2019-02-12 LAB — IRON AND TIBC
Iron: 13 ug/dL — ABNORMAL LOW (ref 28–170)
Saturation Ratios: 7 % — ABNORMAL LOW (ref 10.4–31.8)
TIBC: 174 ug/dL — ABNORMAL LOW (ref 250–450)
UIBC: 161 ug/dL

## 2019-02-12 LAB — CBC
HCT: 25.8 % — ABNORMAL LOW (ref 36.0–46.0)
Hemoglobin: 8.6 g/dL — ABNORMAL LOW (ref 12.0–15.0)
MCH: 29.3 pg (ref 26.0–34.0)
MCHC: 33.3 g/dL (ref 30.0–36.0)
MCV: 87.8 fL (ref 80.0–100.0)
Platelets: 209 10*3/uL (ref 150–400)
RBC: 2.94 MIL/uL — ABNORMAL LOW (ref 3.87–5.11)
RDW: 14.1 % (ref 11.5–15.5)
WBC: 7.2 10*3/uL (ref 4.0–10.5)
nRBC: 0 % (ref 0.0–0.2)

## 2019-02-12 LAB — HEPATIC FUNCTION PANEL
ALT: 9 U/L (ref 0–44)
AST: 12 U/L — ABNORMAL LOW (ref 15–41)
Albumin: 2.7 g/dL — ABNORMAL LOW (ref 3.5–5.0)
Alkaline Phosphatase: 84 U/L (ref 38–126)
Bilirubin, Direct: <0.1 mg/dL (ref 0.0–0.2)
Total Bilirubin: 0.6 mg/dL (ref 0.3–1.2)
Total Protein: 6 g/dL — ABNORMAL LOW (ref 6.5–8.1)

## 2019-02-12 LAB — PROTEIN ELECTROPHORESIS, SERUM
A/G Ratio: 1 (ref 0.7–1.7)
Albumin ELP: 2.8 g/dL — ABNORMAL LOW (ref 2.9–4.4)
Alpha-1-Globulin: 0.3 g/dL (ref 0.0–0.4)
Alpha-2-Globulin: 1 g/dL (ref 0.4–1.0)
Beta Globulin: 0.6 g/dL — ABNORMAL LOW (ref 0.7–1.3)
Gamma Globulin: 0.8 g/dL (ref 0.4–1.8)
Globulin, Total: 2.7 g/dL (ref 2.2–3.9)
Total Protein ELP: 5.5 g/dL — ABNORMAL LOW (ref 6.0–8.5)

## 2019-02-12 LAB — GLUCOSE, CAPILLARY
GLUCOSE-CAPILLARY: 137 mg/dL — AB (ref 70–99)
Glucose-Capillary: 123 mg/dL — ABNORMAL HIGH (ref 70–99)
Glucose-Capillary: 157 mg/dL — ABNORMAL HIGH (ref 70–99)
Glucose-Capillary: 114 mg/dL — ABNORMAL HIGH (ref 70–99)

## 2019-02-12 MED ORDER — OXYCODONE-ACETAMINOPHEN 5-325 MG PO TABS
1.0000 | ORAL_TABLET | Freq: Four times a day (QID) | ORAL | Status: DC | PRN
  Administered 2019-02-12 – 2019-02-19 (×24): 2 via ORAL
  Filled 2019-02-12 (×24): qty 2

## 2019-02-12 MED ORDER — POTASSIUM CHLORIDE CRYS ER 20 MEQ PO TBCR
40.0000 meq | EXTENDED_RELEASE_TABLET | Freq: Once | ORAL | Status: AC
  Administered 2019-02-12: 40 meq via ORAL
  Filled 2019-02-12: qty 2

## 2019-02-12 MED ORDER — MORPHINE SULFATE (PF) 2 MG/ML IV SOLN
2.0000 mg | Freq: Once | INTRAVENOUS | Status: AC
  Administered 2019-02-12: 2 mg via INTRAVENOUS
  Filled 2019-02-12: qty 1

## 2019-02-12 MED ORDER — FLEET ENEMA 7-19 GM/118ML RE ENEM
1.0000 | ENEMA | Freq: Once | RECTAL | Status: AC
  Administered 2019-02-12: 1 via RECTAL
  Filled 2019-02-12: qty 1

## 2019-02-12 MED ORDER — SODIUM BICARBONATE 650 MG PO TABS
1300.0000 mg | ORAL_TABLET | Freq: Two times a day (BID) | ORAL | Status: DC
  Administered 2019-02-12 – 2019-02-19 (×15): 1300 mg via ORAL
  Filled 2019-02-12 (×15): qty 2

## 2019-02-12 NOTE — Plan of Care (Signed)
  Problem: Urinary Elimination: Goal: Signs and symptoms of infection will decrease Outcome: Progressing   

## 2019-02-12 NOTE — Progress Notes (Signed)
[facility] Progress Note  Patient: Erika Fry   PCP: [name], MD DOB: 1946-02-28   DOA: 02/10/2019   DOS: 02/12/2019   Date of Service: the patient was seen and examined on 02/12/2019  Brief hospital course: Pt. with PMH of CKD, DM; admitted on 02/10/2019, presented with complaint of swelling of the leg, was found to have volume overload secondary to acute on chronic diastolic CHF. Currently further plan is continue IV Lasix.  Subjective: Reports constipation and abdominal discomfort.  No nausea no vomiting.  Assessment and Plan: 1.  Bilateral pedal edema Acute on chronic diastolic CHF Essential hypertension Continue home blood pressure medication. We will provide IV Lasix and monitor response. Does not appear to be having any DVT right now.  2.  Acute on chronic kidney disease stage IV. Chronic right-sided hydronephrosis Renal function significantly worsened from baseline. Suspect cardiorenal hemodynamics. Continue with IV Lasix and monitor. Appreciate nephrology consultation. Suspect that the patient actually has chronic hydronephrosisd, discussed with Dr. Louis Meckel from urology.  Feels that the patient should be monitored for 48 hours more before considering PCN.  Patient did not tolerate stent in the past and had severe pain.  3.  Cellulitis of bilateral lower extremity. Treat with IV ceftriaxone.  4.  Possible UTI. Follow-up on culture.  On ceftriaxone  5.  Interstitial cystitis. Chronic pelvic pain. Follows up with Dr. Louis Meckel urology. Recently had a bladder instillation. On 3 different medication for overactive bladder. We will currently only continue to.  6. Type 2 Diabetes Mellitus, uncontroled with renal complication Diet controlled. Monitor.  6. Anemia chronic kidney disease. H&H stable. Monitor.     Component Value Date/Time   HGB 8.6 (L) 02/12/2019 0751   HCT 25.8 (L) 02/12/2019 0751   HCT 20.1 (L) 05/13/2018 0427     7.  Obesity Body mass index is 31.95 kg/m.  Dietary consultation  8.  Pressure ulcer.  Bilateral buttocks.  Left thigh.  POA.  Constipation. Bowel regimen as well as enema.  Pressure Injury 01/17/19 Stage I -  Intact skin with non-blanchable redness of a localized area usually over a bony prominence. (Active)  01/17/19 0230  Location: Buttocks  Location Orientation:   Staging: Stage I -  Intact skin with non-blanchable redness of a localized area usually over a bony prominence.  Wound Description (Comments):   Present on Admission:      Pressure Injury 02/11/19 Stage II -  Partial thickness loss of dermis presenting as a shallow open ulcer with a red, pink wound bed without slough. LEFT inner thigh-looks like a healed wound and has opened from moisture (a gash) (Active)  02/11/19 1600  Location: Thigh  Location Orientation: Anterior;Left  Staging: Stage II -  Partial thickness loss of dermis presenting as a shallow open ulcer with a red, pink wound bed without slough.  Wound Description (Comments): LEFT inner thigh-looks like a healed wound and has opened from moisture (a gash)  Present on Admission: Yes     Diet: renal diet DVT Prophylaxis: subcutaneous Heparin  Advance goals of care discussion: full code  Family Communication: no family was present at bedside, at the time of interview.   Disposition:  Discharge to home.  Consultants: nephrology Procedures: none  Scheduled Meds: . allopurinol  50 mg Oral Daily  . ferrous sulfate  325 mg Oral BID WC  . fesoterodine  4 mg Oral Daily  . folic acid  1 mg Oral Daily  . furosemide  80 mg  Intravenous BID  . heparin  5,000 Units Subcutaneous Q8H  . insulin aspart  0-9 Units Subcutaneous TID WC  . levothyroxine  75 mcg Oral Q0600  . mirabegron ER  50 mg Oral Daily  . pantoprazole  40 mg Oral BID  . polyethylene glycol  17 g Oral Daily  . senna-docusate  1 tablet Oral BID  . simvastatin  40 mg Oral QHS  . sodium  bicarbonate  1,300 mg Oral BID  . sodium chloride flush  3 mL Intravenous Once  . sodium phosphate  1 enema Rectal Once  . tamsulosin  0.4 mg Oral Daily  . vitamin B-12  1,000 mcg Oral Daily   Continuous Infusions: . cefTRIAXone (ROCEPHIN)  IV 1 g (02/12/19 1327)   PRN Meds: acetaminophen **OR** acetaminophen, ondansetron **OR** ondansetron (ZOFRAN) IV Antibiotics: Anti-infectives (From admission, onward)   Start     Dose/Rate Route Frequency Ordered Stop   02/11/19 1300  cefTRIAXone (ROCEPHIN) 1 g in sodium chloride 0.9 % 100 mL IVPB    Note to Pharmacy:  And UTI   1 g 200 mL/hr over 30 Minutes Intravenous Every 24 hours 02/11/19 1247         Objective: Physical Exam: Vitals:   02/11/19 1701 02/11/19 2119 02/12/19 0506 02/12/19 1051  BP:  (!) 143/66 119/66 128/61  Pulse:  99 93 88  Resp:  20 (!) 22 18  Temp:  99.8 F (37.7 C) 97.8 F (36.6 C) 97.6 F (36.4 C)  TempSrc:  Oral Oral Oral  SpO2:  93% 93% 96%  Weight: 87.5 kg 87.1 kg    Height: 5\' 5"  (1.651 m)       Intake/Output Summary (Last 24 hours) at 02/12/2019 1348 Last data filed at 02/12/2019 1000 Gross per 24 hour  Intake 758 ml  Output 1250 ml  Net -492 ml   Filed Weights   02/11/19 1701 02/11/19 2119  Weight: 87.5 kg 87.1 kg   General: Alert, Awake and Oriented to Time, Place and Person. Appear in mild distress, affect appropriate Eyes: PERRL, Conjunctiva normal ENT: Oral Mucosa clear moist. Neck: no JVD, no Abnormal Mass Or lumps Cardiovascular: S1 and S2 Present, aortic systolic  Murmur, Peripheral Pulses Present Respiratory: normal respiratory effort, Bilateral Air entry equal and Decreased, no use of accessory muscle, Clear to Auscultation, no Crackles, no wheezes Abdomen: Bowel Sound present, Soft and no tenderness, no hernia Skin: bilateral leg redness, no Rash, no induration Extremities: bilateral  Pedal edema, no calf tenderness Neurologic: Grossly no focal neuro deficit. Bilaterally Equal motor  strength  Data Reviewed: CBC: Recent Labs  Lab 02/10/19 1205 02/11/19 0459 02/12/19 0751  WBC 5.1 4.7 7.2  NEUTROABS 3.0  --   --   HGB 7.9* 8.2* 8.6*  HCT 24.8* 25.0* 25.8*  MCV 90.8 89.0 87.8  PLT 218 225 419   Basic Metabolic Panel: Recent Labs  Lab 02/10/19 1205 02/11/19 0459 02/12/19 0339  NA 133* 136 135  K 3.5 3.2* 3.0*  CL 103 105 102  CO2 18* 19* 16*  GLUCOSE 115* 117* 143*  BUN 64* 61* 60*  CREATININE 4.22* 3.81* 3.77*  CALCIUM 10.0 9.6 9.6    Liver Function Tests: Recent Labs  Lab 02/10/19 1205 02/12/19 0339  AST 13* 12*  ALT 8 9  ALKPHOS 101 84  BILITOT 0.5 0.6  PROT 6.4* 6.0*  ALBUMIN 3.3* 2.7*   No results for input(s): LIPASE, AMYLASE in the last 168 hours. No results for input(s): AMMONIA in  the last 168 hours. Coagulation Profile: No results for input(s): INR, PROTIME in the last 168 hours. Cardiac Enzymes: No results for input(s): CKTOTAL, CKMB, CKMBINDEX, TROPONINI in the last 168 hours. BNP (last 3 results) No results for input(s): PROBNP in the last 8760 hours. CBG: Recent Labs  Lab 02/11/19 1231 02/11/19 1615 02/11/19 2121 02/12/19 0726 02/12/19 1134  GLUCAP 149* 114* 184* 123* 157*   Studies: No results found.   Time spent: 35 minutes  Author: Berle Mull, MD [facility] 02/12/2019 1:48 PM  To reach On-call, see care teams to locate the attending and reach out to them via [email or web address]. If 7PM-7AM, please contact night-coverage If you still have difficulty reaching the attending provider, please page the [facility] (Director on Call) for [facility] on amion for assistance.

## 2019-02-12 NOTE — Progress Notes (Signed)
Pt. Complained of low abdominal pain. Tylenol  was given. MD was notified. No orders were given. keep pt. Monitored.

## 2019-02-12 NOTE — Progress Notes (Addendum)
Erika Fry   Subjective:   Very pleasant 73 year old lady with a very complex urological history.  During February she underwent lithotripsy and placement of ureteral stent for chronic pelvic pain and urolithiasis.  She has had progressive renal insufficiency with a peak serum creatinine of 6 mg/dL.  With urology intervention and hydration his serum creatinine improved to 2.54. She was evaluated by Dr. Johnney Ou at [facility] on 02/03/2019 and was found to have a 20 pound weight gain and lower extremity edema.  She was placed on torsemide 80 mg twice daily for 5 days decreasing down to once daily. She had hydrodistention therapy on 02/09/2019.  She was admitted on 02/10/2019 and was found to have worsening serum creatinine with a CT scan revealing bilateral nonobstructing stones and right-sided mild to moderate hydronephrosis.  Urology has been consulted.  Her serum creatinine on admission was 4.22 improving to 3.8 at time of consultation by Dr. Joelyn Oms 02/11/2019  Allopurinol 50 mg daily, Rocephin 1 g every 24 hours, Lasix 80 mg IV every 12 hours, simvastatin 40 mg daily insulin sliding scale, Synthroid 75 mcg daily Protonix 40 mg twice daily, fesoterodine 4 mg daily, mirabegron 50 mg daily, Flomax 0.4 mg daily iron sulfate 325 mg twice daily folic acid 1 mg daily W96 1 g daily.   Blood pressure 128/61 pulse of 88 temperature 97.6 O2 sats 96% room air  Sodium 135 potassium 3.0 chloride 102 CO2 16 BUN 60 creatinine 3.77 glucose 143 calcium 9.6 albumin 2.7 AST 12 ALT 9 WBC 7.2 hemoglobin 8.6 platelets 209  Urine output 2075 cc 02/11/2019.  CT scan showed incidental finding of lingula like mass 1.6 to 1.4 cm dedicated chest CT scan is being recommended.  Bilateral renal calculi nonobstructive marked dilation of the right renal collecting system.  Chest x-ray revealed cardiomegaly      Objective:  Vital signs in last 24 hours:  Temp:  [97.6 F (36.4  C)-99.8 F (37.7 C)] 97.6 F (36.4 C) (03/13 1051) Pulse Rate:  [87-99] 88 (03/13 1051) Resp:  [18-22] 18 (03/13 1051) BP: (116-143)/(57-66) 128/61 (03/13 1051) SpO2:  [93 %-96 %] 96 % (03/13 1051) Weight:  [87.1 kg-87.5 kg] 87.1 kg (03/12 2119)  Weight change:  Filed Weights   02/11/19 1701 02/11/19 2119  Weight: 87.5 kg 87.1 kg    Intake/Output: I/O last 3 completed shifts: In: 1238 [P.O.:1138; IV Piggyback:100] Out: 2275 [Urine:2275]   Intake/Output this shift:  No intake/output data recorded.  CVS-regular rate and rhythm no murmurs rubs or gallops pacemaker pocket RS-clear to auscultation with no wheezes or rales ABD- BS present soft non-distended EXT-3+ pitting edema   Basic Metabolic Panel: Recent Labs  Lab 02/10/19 1205 02/11/19 0459 02/12/19 0339  NA 133* 136 135  K 3.5 3.2* 3.0*  CL 103 105 102  CO2 18* 19* 16*  GLUCOSE 115* 117* 143*  BUN 64* 61* 60*  CREATININE 4.22* 3.81* 3.77*  CALCIUM 10.0 9.6 9.6    Liver Function Tests: Recent Labs  Lab 02/10/19 1205 02/12/19 0339  AST 13* 12*  ALT 8 9  ALKPHOS 101 84  BILITOT 0.5 0.6  PROT 6.4* 6.0*  ALBUMIN 3.3* 2.7*   No results for input(s): LIPASE, AMYLASE in the last 168 hours. No results for input(s): AMMONIA in the last 168 hours.  CBC: Recent Labs  Lab 02/10/19 1205 02/11/19 0459 02/12/19 0751  WBC 5.1 4.7 7.2  NEUTROABS 3.0  --   --   HGB 7.9*  8.2* 8.6*  HCT 24.8* 25.0* 25.8*  MCV 90.8 89.0 87.8  PLT 218 225 209    Cardiac Enzymes: No results for input(s): CKTOTAL, CKMB, CKMBINDEX, TROPONINI in the last 168 hours.  BNP: Invalid input(s): POCBNP  CBG: Recent Labs  Lab 02/11/19 0729 02/11/19 1231 02/11/19 1615 02/11/19 2121 02/12/19 0726  GLUCAP 111* 149* 114* 184* 50*    Microbiology: Results for orders placed or performed during the hospital encounter of 02/10/19  Culture, Urine     Status: Abnormal (Preliminary result)   Collection Time: 02/11/19 12:25 AM   Result Value Ref Range Status   Specimen Description URINE, CLEAN CATCH  Final   Special Requests NONE  Final   Culture (A)  Final    50,000 COLONIES/mL GRAM NEGATIVE RODS CULTURE REINCUBATED FOR BETTER GROWTH Performed at [facility], [place] [address] [place], [place] [address]    Report Status PENDING  Incomplete    Coagulation Studies: No results for input(s): LABPROT, INR in the last 72 hours.  Urinalysis: Recent Labs    02/10/19 1726  COLORURINE YELLOW  LABSPEC 1.008  PHURINE 5.0  GLUCOSEU NEGATIVE  HGBUR SMALL*  BILIRUBINUR NEGATIVE  KETONESUR NEGATIVE  PROTEINUR 30*  NITRITE NEGATIVE  LEUKOCYTESUR LARGE*      Imaging: Dg Chest 2 View  Result Date: 02/10/2019 CLINICAL DATA:  Acute onset of bilateral lower extremity edema. EXAM: CHEST - 2 VIEW COMPARISON:  Chest radiograph performed 01/16/2019. FINDINGS: The lungs are well-aerated and clear. There is no evidence of focal opacification, pleural effusion or pneumothorax. The heart is mildly enlarged. A pacemaker is noted at the left chest wall, with leads ending at the right atrium and right ventricle. No acute osseous abnormalities are seen. IMPRESSION: Mild cardiomegaly. Lungs remain grossly clear. Electronically Signed   By: Garald Balding M.D.   On: 02/10/2019 17:43   Ct Renal Stone Study  Result Date: 02/10/2019 CLINICAL DATA:  Bilateral leg swelling since coming off of Lasix. Erythema of the left leg also noted. EXAM: CT ABDOMEN AND PELVIS WITHOUT CONTRAST TECHNIQUE: Multidetector CT imaging of the abdomen and pelvis was performed following the standard protocol without IV contrast. COMPARISON:  01/19/2019 FINDINGS: Lower chest: Noncalcified masslike opacity in the lingula is identified measuring 16 x 14 mm, series 7/. Bibasilar dependent atelectasis is identified. Possible additional tiny nodule in posterior right lower lobe measuring 3 mm. Left main and three-vessel coronary arteriosclerosis. Right-sided pacer  leads are present. No significant pericardial effusion or thickening. Small hiatal hernia. Hepatobiliary: The unenhanced liver is unremarkable without apparent mass or biliary dilatation given limitations of a noncontrast study. Gallbladder contains layering sludge and/or calculi. No secondary signs of acute cholecystitis. A calcification is noted in the duodenum possibly representing a distal common bowel duct stone measuring 6 mm. Correlate with liver function tests. If necessary, MRCP or ERCP may help further assessment, series 3/39. Pancreas: Atrophic without inflammation, ductal dilatation or mass. Spleen: No splenomegaly Adrenals/Urinary Tract: Normal bilateral adrenal glands. A 5 mm nonobstructing left upper pole renal calculus is identified. At least 4-5 renal stones are noted in the interpolar aspect of the right kidney, the largest measuring up to approximately 4 mm with moderate to marked dilatation of the right renal collecting system. Right-sided urothelial thickening is noted the level of the urinary bladder. The bladder is also thickened in appearance some which is due to underdistention but cystitis is also possibility. No definite ureteral stones are identified. Stomach/Bowel: Small hiatal hernia. Physiologic distention of the  stomach with normal small bowel rotation. No small bowel obstruction are inflammation. Moderate to marked stool retention throughout the colon consistent with constipation. Appendix appears normal. Vascular/Lymphatic: Aortic atherosclerosis. No enlarged abdominal or pelvic lymph nodes. Reproductive: Uterus and bilateral adnexa are unremarkable. Other: Presacral edema.  No free air nor free fluid. Musculoskeletal: Lumbar spondylosis most marked at L4-5 with discogenic sclerosis. IMPRESSION: 1. **An incidental finding of potential clinical significance has been found. Lingular masslike abnormality measuring 16 x 14 mm, partially included on this study. Dedicated chest CT is  recommended. Findings raise concern for pulmonary neoplasm. Additional tiny 3 mm nodule in the subpleural right lower lobe is indeterminate for neoplasm versus post inflammatory/infectious change.** 2. Bilateral renal calculi are identified without obstructive uropathy. Moderate-to-marked chronic dilatation of the right renal collecting system with diffuse urothelial and bladder wall thickening raise concern for stigmata urinary tract infection. Correlate. 3. Stool is noted throughout the colon consistent with constipation. 4. 6 mm calcification in the region of the second portion of the duodenum may represent choledocholithiasis. Correlate with liver function tests. Additional layering gallstones and calculi without secondary signs of cholecystitis are noted within the gallbladder. Electronically Signed   By: Ashley Royalty M.D.   On: 02/10/2019 20:54     Medications:   . cefTRIAXone (ROCEPHIN)  IV 1 g (02/11/19 1423)   . allopurinol  50 mg Oral Daily  . ferrous sulfate  325 mg Oral BID WC  . fesoterodine  4 mg Oral Daily  . folic acid  1 mg Oral Daily  . furosemide  80 mg Intravenous BID  . heparin  5,000 Units Subcutaneous Q8H  . insulin aspart  0-9 Units Subcutaneous TID WC  . levothyroxine  75 mcg Oral Q0600  . mirabegron ER  50 mg Oral Daily  . pantoprazole  40 mg Oral BID  . polyethylene glycol  17 g Oral Daily  . senna-docusate  1 tablet Oral BID  . simvastatin  40 mg Oral QHS  . sodium chloride flush  3 mL Intravenous Once  . tamsulosin  0.4 mg Oral Daily  . vitamin B-12  1,000 mcg Oral Daily   acetaminophen **OR** acetaminophen, ondansetron **OR** ondansetron (ZOFRAN) IV  Assessment/ Plan:   Acute on chronic kidney disease stage IV.  Creatinine slowly improving.  She appears to have some hydroureter on the right side.  Markedly volume overloaded.  Etiology of her renal insufficiency appears to be secondary to stone disease and nephrolithiasis with obstruction.  Urinalysis does not  have any proteinuria or hematuria though chronic pyuria is present.  Urological input would be appreciated.  I would avoid nonsteroidal inflammatory disease to inhibitors ARB use and Cox 2 inhibitors.  It seems at baseline serum creatinine is between 2.5 and 3.0.  Continues on Flomax 0.4 mg daily.  Serum protein electrophoresis is being rechecked.  Metabolic acidosis we will add sodium bicarbonate 650 mg 2 tablets twice daily  Hypokalemia currently being replaced by primary  service  Anemia we will check iron studies  Nephrolithiasis.  Followed by urology.  Will check PTH level.  Will check renal panel including calcium and phosphorus.  Diabetes mellitus type 2 as per primary team  Hypertension appears adequately controlled  Anasarca.  Continue diuresis with Lasix 80 mg IV twice daily.  Cellulitis continues on Rocephin IV.  Pyuria urine culture.  Interstitial cystitis followed by urology receiving Hydro dilation therapy  Placement of permanent pacemaker 03/25/2018 Dr. Crissie Sickles  Possible lingular mass 1.4 x 1.6  cm noted on abdominal CT.  Recommending dedicated chest CT.  I would not use IV contrast at this present time due to the acute kidney injury   LOS: [place] @TODAY @11 :13 AM

## 2019-02-12 NOTE — Progress Notes (Signed)
Pt. Complained of constipation. Requesting enema. MD notified, gave verbal orders for fleet enema. Orders placed and followed. Pt. had a bowel movement. We continue to monitor.

## 2019-02-12 NOTE — Progress Notes (Signed)
   02/12/19 2010  Provider Notification  Provider Name/Title Bodenheimer NP  Date Provider Notified 02/12/19  Time Provider Notified 2009  Notification Type Page  Notification Reason Requested by patient/family  Response See new orders  Date of Provider Response 02/12/19    Patient c/o severe abdominal pain/bladder pain.  Rates as 10/10.  [facility] called and made aware.  New orders for pain medications received and implemented.  Will continue to monitor patient.  Earleen Reaper RN

## 2019-02-12 NOTE — Care Management Important Message (Signed)
Important Message  Patient Details  Name: Erika Fry MRN: [id number] Date of Birth: [birth year]   Medicare Important Message Given:  Yes    [name] 02/12/2019, 3:07 PM

## 2019-02-13 ENCOUNTER — Inpatient Hospital Stay (HOSPITAL_COMMUNITY): Payer: Medicare Other

## 2019-02-13 DIAGNOSIS — E119 Type 2 diabetes mellitus without complications: Secondary | ICD-10-CM

## 2019-02-13 DIAGNOSIS — I10 Essential (primary) hypertension: Secondary | ICD-10-CM

## 2019-02-13 DIAGNOSIS — N184 Chronic kidney disease, stage 4 (severe): Secondary | ICD-10-CM

## 2019-02-13 LAB — GLUCOSE, CAPILLARY
Glucose-Capillary: 94 mg/dL (ref 70–99)
Glucose-Capillary: 122 mg/dL — ABNORMAL HIGH (ref 70–99)
Glucose-Capillary: 96 mg/dL (ref 70–99)
Glucose-Capillary: 109 mg/dL — ABNORMAL HIGH (ref 70–99)

## 2019-02-13 LAB — CBC
HCT: 23.2 % — ABNORMAL LOW (ref 36.0–46.0)
Hemoglobin: 7.7 g/dL — ABNORMAL LOW (ref 12.0–15.0)
MCH: 28.9 pg (ref 26.0–34.0)
MCHC: 33.2 g/dL (ref 30.0–36.0)
MCV: 87.2 fL (ref 80.0–100.0)
Platelets: 182 10*3/uL (ref 150–400)
RBC: 2.66 MIL/uL — ABNORMAL LOW (ref 3.87–5.11)
RDW: 14 % (ref 11.5–15.5)
WBC: 6.5 10*3/uL (ref 4.0–10.5)
nRBC: 0 % (ref 0.0–0.2)

## 2019-02-13 LAB — HEPATIC FUNCTION PANEL
ALT: 8 U/L (ref 0–44)
AST: 10 U/L — ABNORMAL LOW (ref 15–41)
Albumin: 2.5 g/dL — ABNORMAL LOW (ref 3.5–5.0)
Alkaline Phosphatase: 79 U/L (ref 38–126)
Bilirubin, Direct: <0.1 mg/dL (ref 0.0–0.2)
Total Bilirubin: 0.6 mg/dL (ref 0.3–1.2)
Total Protein: 5.5 g/dL — ABNORMAL LOW (ref 6.5–8.1)

## 2019-02-13 LAB — RENAL FUNCTION PANEL
ALBUMIN: 2.5 g/dL — AB (ref 3.5–5.0)
Anion gap: 14 (ref 5–15)
BUN: 61 mg/dL — ABNORMAL HIGH (ref 8–23)
CO2: 19 mmol/L — ABNORMAL LOW (ref 22–32)
Calcium: 9.5 mg/dL (ref 8.9–10.3)
Chloride: 99 mmol/L (ref 98–111)
Creatinine, Ser: 3.58 mg/dL — ABNORMAL HIGH (ref 0.44–1.00)
GFR calc Af Amer: 14 mL/min — ABNORMAL LOW (ref >60)
GFR calc non Af Amer: 12 mL/min — ABNORMAL LOW (ref >60)
Glucose, Bld: 107 mg/dL — ABNORMAL HIGH (ref 70–99)
PHOSPHORUS: 3.4 mg/dL (ref 2.5–4.6)
Potassium: 3.3 mmol/L — ABNORMAL LOW (ref 3.5–5.1)
Sodium: 132 mmol/L — ABNORMAL LOW (ref 135–145)

## 2019-02-13 LAB — HEMOGLOBIN AND HEMATOCRIT, BLOOD
HCT: 23.7 % — ABNORMAL LOW (ref 36.0–46.0)
Hemoglobin: 7.8 g/dL — ABNORMAL LOW (ref 12.0–15.0)

## 2019-02-13 LAB — PARATHYROID HORMONE, INTACT (NO CA): PTH: 57 pg/mL (ref 15–65)

## 2019-02-13 MED ORDER — POTASSIUM CHLORIDE CRYS ER 20 MEQ PO TBCR
20.0000 meq | EXTENDED_RELEASE_TABLET | Freq: Once | ORAL | Status: AC
  Administered 2019-02-13: 20 meq via ORAL
  Filled 2019-02-13: qty 1

## 2019-02-13 MED ORDER — DOCUSATE SODIUM 100 MG PO CAPS
100.0000 mg | ORAL_CAPSULE | Freq: Two times a day (BID) | ORAL | Status: DC
  Administered 2019-02-13 – 2019-02-19 (×13): 100 mg via ORAL
  Filled 2019-02-13 (×13): qty 1

## 2019-02-13 MED ORDER — FUROSEMIDE 80 MG PO TABS
80.0000 mg | ORAL_TABLET | Freq: Two times a day (BID) | ORAL | Status: DC
  Administered 2019-02-13 – 2019-02-15 (×5): 80 mg via ORAL
  Filled 2019-02-13 (×5): qty 1

## 2019-02-13 MED ORDER — SODIUM CHLORIDE 0.9 % IV SOLN
125.0000 mg | Freq: Once | INTRAVENOUS | Status: AC
  Administered 2019-02-13: 125 mg via INTRAVENOUS
  Filled 2019-02-13: qty 10

## 2019-02-13 MED ORDER — SODIUM CHLORIDE 0.9 % IV SOLN
125.0000 mg | Freq: Every day | INTRAVENOUS | Status: DC
  Administered 2019-02-14 – 2019-02-19 (×6): 125 mg via INTRAVENOUS
  Filled 2019-02-13 (×6): qty 10

## 2019-02-13 MED ORDER — MORPHINE SULFATE (PF) 2 MG/ML IV SOLN
2.0000 mg | INTRAVENOUS | Status: DC | PRN
  Administered 2019-02-14 – 2019-02-17 (×8): 2 mg via INTRAVENOUS
  Filled 2019-02-13 (×10): qty 1

## 2019-02-13 NOTE — Progress Notes (Signed)
[facility] ROUNDING NOTE   Subjective:   Very pleasant 73 year old lady with a very complex urological history.  During February she underwent lithotripsy and placement of ureteral stent for chronic pelvic pain and urolithiasis.  She has had progressive renal insufficiency with a peak serum creatinine of 6 mg/dL.  With urology intervention and hydration his serum creatinine improved to 2.54. She was evaluated by Dr. Johnney Ou at [facility] on 02/03/2019 and was found to have a 20 pound weight gain and lower extremity edema.  She was placed on torsemide 80 mg twice daily for 5 days decreasing down to once daily. She had hydrodistention therapy on 02/09/2019.  She was admitted on 02/10/2019 and was found to have worsening serum creatinine with a CT scan revealing bilateral nonobstructing stones and right-sided mild to moderate hydronephrosis.  Urology has been consulted.  Her serum creatinine on admission was 4.22 improving to 3.8 at time of consultation by Dr. Joelyn Oms 02/11/2019  Allopurinol 50 mg daily, Rocephin 1 g every 24 hours, Lasix 80 mg IV every 12 hours, simvastatin 40 mg daily insulin sliding scale, Synthroid 75 mcg daily Protonix 40 mg twice daily, fesoterodine 4 mg daily, mirabegron 50 mg daily, Flomax 0.4 mg daily iron sulfate 325 mg twice daily folic acid 1 mg daily E36 1 g daily.  Blood pressure 101/53 pulse 82 temperature 97.7 O2 sats 94% room air  Urine output 900 cc 02/12/2019.  So far 1550 cc 02/13/2019.  Sodium 132 potassium 3.3 chloride 99 CO2 19 BUN 61 creatinine 3.58 glucose 107 calcium 9.5 phosphorus 3.4 Albumin 2.5 WBC 6.5 hemoglobin 7.7 platelets 182.   CT scan showed incidental finding of lingula like mass 1.6 to 1.4 cm dedicated chest CT scan is being recommended.  Bilateral renal calculi nonobstructive marked dilation of the right renal collecting system.  Chest x-ray revealed cardiomegaly      Objective:  Vital signs in last 24 hours:   Temp:  [97.7 F (36.5 C)-99 F (37.2 C)] 97.7 F (36.5 C) (03/14 0919) Pulse Rate:  [82-91] 82 (03/14 0919) Resp:  [18] 18 (03/14 0408) BP: (101-139)/(53-71) 101/53 (03/14 0919) SpO2:  [94 %-99 %] 94 % (03/14 0919)  Weight change:  Filed Weights   02/11/19 1701 02/11/19 2119  Weight: 87.5 kg 87.1 kg    Intake/Output: I/O last 3 completed shifts: In: 1380 [P.O.:1380] Out: 2850 [Urine:2850]   Intake/Output this shift:  Total I/O In: -  Out: 400 [Urine:400]  CVS-regular rate and rhythm no murmurs rubs or gallops pacemaker pocket RS-clear to auscultation with no wheezes or rales ABD- BS present soft non-distended EXT-3+ pitting edema   Basic Metabolic Panel: Recent Labs  Lab 02/10/19 1205 02/11/19 0459 02/12/19 0339 02/13/19 0535  NA 133* 136 135 132*  K 3.5 3.2* 3.0* 3.3*  CL 103 105 102 99  CO2 18* 19* 16* 19*  GLUCOSE 115* 117* 143* 107*  BUN 64* 61* 60* 61*  CREATININE 4.22* 3.81* 3.77* 3.58*  CALCIUM 10.0 9.6 9.6 9.5  PHOS  --   --   --  3.4    Liver Function Tests: Recent Labs  Lab 02/10/19 1205 02/12/19 0339 02/13/19 0535  AST 13* 12* 10*  ALT 8 9 8   ALKPHOS 101 84 79  BILITOT 0.5 0.6 0.6  PROT 6.4* 6.0* 5.5*  ALBUMIN 3.3* 2.7* 2.5*  2.5*   No results for input(s): LIPASE, AMYLASE in the last 168 hours. No results for input(s): AMMONIA in the last 168 hours.  CBC: Recent Labs  Lab 02/10/19 1205 02/11/19 0459 02/12/19 0751 02/13/19 0535 02/13/19 0942  WBC 5.1 4.7 7.2 6.5  --   NEUTROABS 3.0  --   --   --   --   HGB 7.9* 8.2* 8.6* 7.7* 7.8*  HCT 24.8* 25.0* 25.8* 23.2* 23.7*  MCV 90.8 89.0 87.8 87.2  --   PLT 218 225 209 182  --     Cardiac Enzymes: No results for input(s): CKTOTAL, CKMB, CKMBINDEX, TROPONINI in the last 168 hours.  BNP: Invalid input(s): POCBNP  CBG: Recent Labs  Lab 02/12/19 0726 02/12/19 1134 02/12/19 1620 02/12/19 2103 02/13/19 0706  GLUCAP 123* 157* 137* 114* 94    Microbiology: Results for  orders placed or performed during the hospital encounter of 02/10/19  Culture, Urine     Status: Abnormal (Preliminary result)   Collection Time: 02/11/19 12:25 AM  Result Value Ref Range Status   Specimen Description URINE, CLEAN CATCH  Final   Special Requests NONE  Final   Culture (A)  Final    50,000 COLONIES/mL GRAM NEGATIVE RODS 20,000 COLONIES/mL ENTEROCOCCUS FAECALIS CULTURE REINCUBATED FOR BETTER GROWTH Performed at [facility], [place] [address], [place], [place] [address]    Report Status PENDING  Incomplete  Culture, blood (routine x 2)     Status: None (Preliminary result)   Collection Time: 02/11/19  3:10 PM  Result Value Ref Range Status   Specimen Description BLOOD RIGHT ANTECUBITAL  Final   Special Requests   Final    BOTTLES DRAWN AEROBIC ONLY Blood Culture adequate volume   Culture   Final    NO GROWTH 1 DAY Performed at [facility], [place] [address] [place], [place] [address]    Report Status PENDING  Incomplete  Culture, blood (routine x 2)     Status: None (Preliminary result)   Collection Time: 02/11/19  3:15 PM  Result Value Ref Range Status   Specimen Description BLOOD RIGHT HAND  Final   Special Requests   Final    BOTTLES DRAWN AEROBIC ONLY Blood Culture adequate volume   Culture   Final    NO GROWTH 1 DAY Performed at [facility], [place] [address] [place], [place] [address]    Report Status PENDING  Incomplete    Coagulation Studies: No results for input(s): LABPROT, INR in the last 72 hours.  Urinalysis: Recent Labs    02/10/19 1726  COLORURINE YELLOW  LABSPEC 1.008  PHURINE 5.0  GLUCOSEU NEGATIVE  HGBUR SMALL*  BILIRUBINUR NEGATIVE  KETONESUR NEGATIVE  PROTEINUR 30*  NITRITE NEGATIVE  LEUKOCYTESUR LARGE*      Imaging: Dg Abd 1 View  Result Date: 02/13/2019 CLINICAL DATA:  Constipation EXAM: ABDOMEN - 1 VIEW COMPARISON:  CT abdomen/pelvis dated 02/10/2019 FINDINGS: Nonobstructive bowel gas pattern. Mild to  moderate left colonic stool burden. Mild degenerative changes of the lumbar spine. IMPRESSION: Mild to moderate left colonic stool burden. Electronically Signed   By: Julian Hy M.D.   On: 02/13/2019 08:14     Medications:   . cefTRIAXone (ROCEPHIN)  IV 1 g (02/12/19 1327)  . ferric gluconate (FERRLECIT/NULECIT) Test Dose     . allopurinol  50 mg Oral Daily  . docusate sodium  100 mg Oral BID  . ferrous sulfate  325 mg Oral BID WC  . fesoterodine  4 mg Oral Daily  . folic acid  1 mg Oral Daily  . furosemide  80 mg Intravenous BID  .  heparin  5,000 Units Subcutaneous Q8H  . insulin aspart  0-9 Units Subcutaneous TID WC  . levothyroxine  75 mcg Oral Q0600  . mirabegron ER  50 mg Oral Daily  . pantoprazole  40 mg Oral BID  . polyethylene glycol  17 g Oral Daily  . senna-docusate  1 tablet Oral BID  . simvastatin  40 mg Oral QHS  . sodium bicarbonate  1,300 mg Oral BID  . sodium chloride flush  3 mL Intravenous Once  . tamsulosin  0.4 mg Oral Daily  . vitamin B-12  1,000 mcg Oral Daily   acetaminophen **OR** acetaminophen, morphine injection, ondansetron **OR** ondansetron (ZOFRAN) IV, oxyCODONE-acetaminophen  Assessment/ Plan:   Acute on chronic kidney disease stage IV.  Creatinine slowly improving.  Baseline serum creatinine about 2.5-3.0 from epic records.  She appears to have some hydroureter on the right side.  Markedly volume overloaded.  Etiology of her renal insufficiency appears to be secondary to stone disease and nephrolithiasis with obstruction.  The does not look to be any other complicating factors.  No M spike noted.  02/11/2019 urinalysis does not have any proteinuria or hematuria though chronic pyuria is present..  I would avoid nonsteroidal inflammatory disease to inhibitors ARB use and Cox 2 inhibitors. Continues on Flomax 0.4 mg daily.    Metabolic acidosis we will add sodium bicarbonate 650 mg 2 tablets twice daily  Hypokalemia currently being replaced by  primary  service  Anemia T sat 7% will add IV iron  Nephrolithiasis.  Followed by urology.  PTH 57  Diabetes mellitus type 2 as per primary team  Hypertension appears adequately controlled  Anasarca.  Will transition to oral Lasix 80 mg twice a day  Cellulitis continues on Rocephin IV.  Pyuria urine culture.  Interstitial cystitis followed by urology receiving Hydro dilation therapy  Placement of permanent pacemaker 03/25/2018 Dr. Crissie Sickles  Possible lingular mass 1.4 x 1.6 cm noted on abdominal CT.  Recommending dedicated chest CT.  I would not use IV contrast    LOS: 3 Erika Fry @TODAY @11 :03 AM

## 2019-02-13 NOTE — Progress Notes (Signed)
PROGRESS NOTE    Patient: Erika Fry                            PCP: [name], MD                    DOB: 1946-04-09            DOA: 02/10/2019 CNO:[id number]             DOS: 02/13/2019, 11:57 AM   LOS: 3 days   Date of Service: The patient was seen and examined on 02/13/2019  Subjective:   The patient was seen and examined this morning.  Feeling much better.  Nominal pain has improved.  patient reports she has had good bowel movements with enema. She is breathing better this morning.  Still having any chest pain  -Reporting that she is been urinating well on IV Lasix  Brief Narrative:  Pt. with PMH of CKD, DM; admitted on 02/10/2019, presented with complaint of swelling of the leg, was found to have volume overload secondary to acute on chronic diastolic CHF. Currently further plan is continue IV Lasix.   Principal Problem:   ARF (acute renal failure) (HCC) Active Problems:   CKD (chronic kidney disease), stage IV (HCC)   Diet-controlled diabetes mellitus ([place])   Hypothyroidism   Essential hypertension    Assessment & Plan:   Anasarca/ Bilateral pedal edema -Likely due to acute on chronic diastolic CHF, EKG, Essential hypertension -Continue home blood pressure medication. -continue with IV Lasix and monitor >> nephrology switching to Lasix 80 mg p.o. twice daily -Continue to monitor I's and O's, weight  Does not appear to be having any DVT right now.  Acute on chronic kidney disease stage IV. Chronic right-sided hydronephrosis Baseline serum creatinine 2.5 - 3.0 >>>  Suspect cardiorenal hemodynamics. Appreciate nephrology inputs   Hydronephrosis Suspect that the patient actually has chronic hydronephrosisd, discussed with Dr. Louis Meckel from urology.  Feels that the patient should be monitored for 48 hours more before considering PCN.  Patient did not tolerate stent in the past and had severe pain.  Cellulitis of bilateral lower extremity. Treat  with IV ceftriaxone. -Wound care team consulted   Possible UTI. Follow-up on culture.  On ceftriaxone   Interstitial cystitis. -With on and off acute on chronic suprapubic tenderness. Follows up with Dr. Louis Meckel urology. Recently had a bladder instillation. On 3 different medication for overactive bladder.   Type 2 Diabetes Mellitus, uncontroled with renal complication Diet controlled. Monitor. SSI    Acute on chronic anemia/anemia of chronic disease/iron deficiency anemia/anemia chronic kidney disease. -Monitoring H&H -1 dose of IV iron today -Continue p.o. iron supplements  Obesity Body mass index is 31.95 kg/m.  Dietary consultation   Pressure ulcer.  Bilateral buttocks.  Left thigh.  POA.   Constipation. Bowel regimen as well as enema. -Bonded well, has been having bowel movements  Pressure Injury 01/17/19 Stage I -  Intact skin with non-blanchable redness of a localized area usually over a bony prominence. (Active)  01/17/19 0230  Location: Buttocks  Location Orientation:   Staging: Stage I -  Intact skin with non-blanchable redness of a localized area usually over a bony prominence.  Wound Description (Comments):   Present on Admission:      Pressure Injury 02/11/19 Stage II -  Partial thickness loss of dermis presenting as a shallow open ulcer with a red, pink wound bed without  slough. LEFT inner thigh-looks like a healed wound and has opened from moisture (a gash) (Active)  02/11/19 1600  Location: Thigh  Location Orientation: Anterior;Left  Staging: Stage II -  Partial thickness loss of dermis presenting as a shallow open ulcer with a red, pink wound bed without slough.  Wound Description (Comments): LEFT inner thigh-looks like a healed wound and has opened from moisture (a gash)  Present on Admission: Yes     Diet: renal diet DVT Prophylaxis: subcutaneous Heparin  Advance goals of care discussion: full code  Family Communication: no  family was present at bedside, at the time of interview.   Disposition:  Discharge to home.  Consultants: nephrology Procedures: none  Procedures:   No admission procedures for hospital encounter.     Antimicrobials:  Anti-infectives (From admission, onward)   Start     Dose/Rate Route Frequency Ordered Stop   02/11/19 1300  cefTRIAXone (ROCEPHIN) 1 g in sodium chloride 0.9 % 100 mL IVPB    Note to Pharmacy:  And UTI   1 g 200 mL/hr over 30 Minutes Intravenous Every 24 hours 02/11/19 1247         Medication:  . allopurinol  50 mg Oral Daily  . docusate sodium  100 mg Oral BID  . ferrous sulfate  325 mg Oral BID WC  . fesoterodine  4 mg Oral Daily  . folic acid  1 mg Oral Daily  . furosemide  80 mg Oral BID  . heparin  5,000 Units Subcutaneous Q8H  . insulin aspart  0-9 Units Subcutaneous TID WC  . levothyroxine  75 mcg Oral Q0600  . mirabegron ER  50 mg Oral Daily  . pantoprazole  40 mg Oral BID  . polyethylene glycol  17 g Oral Daily  . senna-docusate  1 tablet Oral BID  . simvastatin  40 mg Oral QHS  . sodium bicarbonate  1,300 mg Oral BID  . sodium chloride flush  3 mL Intravenous Once  . tamsulosin  0.4 mg Oral Daily  . vitamin B-12  1,000 mcg Oral Daily    acetaminophen **OR** acetaminophen, morphine injection, ondansetron **OR** ondansetron (ZOFRAN) IV, oxyCODONE-acetaminophen     Objective:   Vitals:   02/12/19 1622 02/12/19 2105 02/13/19 0408 02/13/19 0919  BP: 139/71 108/64 128/71 (!) 101/53  Pulse: 91 84 83 82  Resp: 18 18 18    Temp: 99 F (37.2 C) 98 F (36.7 C) 98.2 F (36.8 C) 97.7 F (36.5 C)  TempSrc: Oral Oral Oral Oral  SpO2: 98% 98% 99% 94%  Weight:      Height:        Intake/Output Summary (Last 24 hours) at 02/13/2019 1157 Last data filed at 02/13/2019 1042 Gross per 24 hour  Intake 840 ml  Output 2150 ml  Net -1310 ml   Filed Weights   02/11/19 1701 02/11/19 2119  Weight: 87.5 kg 87.1 kg     Examination:     General exam: Appears calm and comfortable  BP (!) 101/53 (BP Location: Right Arm)   Pulse 82   Temp 97.7 F (36.5 C) (Oral)   Resp 18   Ht 5\' 5"  (1.651 m)   Wt 87.1 kg   SpO2 94%   BMI 31.95 kg/m    Physical Exam  Constitution:  Alert, cooperative, no distress,  Psychiatric: Normal and stable mood and affect, cognition intact,   HEENT: Normocephalic, PERRL, otherwise with in Normal limits  Chest:Chest symmetric Cardio vascular:  S1/S2, RRR,  No murmure, No Rubs or Gallops  pulmonary: Clear to auscultation bilaterally, respirations unlabored, negative wheezes / crackles Abdomen: Soft, non-tender, non-distended, bowel sounds,no masses, no organomegaly Muscular skeletal: Limited exam - in bed, able to move all 4 extremities, Normal strength,  Neuro: CNII-XII intact. , normal motor and sensation, reflexes intact  Extremities:  Able to move all 4 extremities spontaneously, +2 pitting edema lower extremities, +2 pulses  Skin: Dry, warm to touch, negative for any Rashes, No open wounds Wounds: per nursing documentation  LABs:  CBC Latest Ref Rng & Units 02/13/2019 02/13/2019 02/12/2019  WBC 4.0 - 10.5 K/uL - 6.5 7.2  Hemoglobin 12.0 - 15.0 g/dL 7.8(L) 7.7(L) 8.6(L)  Hematocrit 36.0 - 46.0 % 23.7(L) 23.2(L) 25.8(L)  Platelets 150 - 400 K/uL - 182 209   CMP Latest Ref Rng & Units 02/13/2019 02/12/2019 02/11/2019  Glucose 70 - 99 mg/dL 107(H) 143(H) 117(H)  BUN 8 - 23 mg/dL 61(H) 60(H) 61(H)  Creatinine 0.44 - 1.00 mg/dL 3.58(H) 3.77(H) 3.81(H)  Sodium 135 - 145 mmol/L 132(L) 135 136  Potassium 3.5 - 5.1 mmol/L 3.3(L) 3.0(L) 3.2(L)  Chloride 98 - 111 mmol/L 99 102 105  CO2 22 - 32 mmol/L 19(L) 16(L) 19(L)  Calcium 8.9 - 10.3 mg/dL 9.5 9.6 9.6  Total Protein 6.5 - 8.1 g/dL 5.5(L) 6.0(L) -  Total Bilirubin 0.3 - 1.2 mg/dL 0.6 0.6 -  Alkaline Phos 38 - 126 U/L 79 84 -  AST 15 - 41 U/L 10(L) 12(L) -  ALT 0 - 44 U/L 8 9 -

## 2019-02-14 DIAGNOSIS — N390 Urinary tract infection, site not specified: Secondary | ICD-10-CM

## 2019-02-14 LAB — RENAL FUNCTION PANEL
Albumin: 2.6 g/dL — ABNORMAL LOW (ref 3.5–5.0)
Anion gap: 14 (ref 5–15)
BUN: 59 mg/dL — ABNORMAL HIGH (ref 8–23)
CO2: 21 mmol/L — ABNORMAL LOW (ref 22–32)
Calcium: 9.4 mg/dL (ref 8.9–10.3)
Chloride: 98 mmol/L (ref 98–111)
Creatinine, Ser: 3.58 mg/dL — ABNORMAL HIGH (ref 0.44–1.00)
GFR calc Af Amer: 14 mL/min — ABNORMAL LOW (ref >60)
GFR, EST NON AFRICAN AMERICAN: 12 mL/min — AB (ref >60)
Glucose, Bld: 110 mg/dL — ABNORMAL HIGH (ref 70–99)
Phosphorus: 2.8 mg/dL (ref 2.5–4.6)
Potassium: 3.4 mmol/L — ABNORMAL LOW (ref 3.5–5.1)
Sodium: 133 mmol/L — ABNORMAL LOW (ref 135–145)

## 2019-02-14 LAB — GLUCOSE, CAPILLARY
Glucose-Capillary: 104 mg/dL — ABNORMAL HIGH (ref 70–99)
Glucose-Capillary: 102 mg/dL — ABNORMAL HIGH (ref 70–99)
Glucose-Capillary: 111 mg/dL — ABNORMAL HIGH (ref 70–99)
Glucose-Capillary: 88 mg/dL (ref 70–99)

## 2019-02-14 LAB — CBC
HCT: 24.5 % — ABNORMAL LOW (ref 36.0–46.0)
Hemoglobin: 7.8 g/dL — ABNORMAL LOW (ref 12.0–15.0)
MCH: 28.1 pg (ref 26.0–34.0)
MCHC: 31.8 g/dL (ref 30.0–36.0)
MCV: 88.1 fL (ref 80.0–100.0)
NRBC: 0 % (ref 0.0–0.2)
Platelets: 211 10*3/uL (ref 150–400)
RBC: 2.78 MIL/uL — ABNORMAL LOW (ref 3.87–5.11)
RDW: 13.9 % (ref 11.5–15.5)
WBC: 6.2 10*3/uL (ref 4.0–10.5)

## 2019-02-14 LAB — URINE CULTURE: Culture: 50000 — AB

## 2019-02-14 LAB — HEPATIC FUNCTION PANEL
ALT: 6 U/L (ref 0–44)
AST: 10 U/L — AB (ref 15–41)
Albumin: 2.6 g/dL — ABNORMAL LOW (ref 3.5–5.0)
Alkaline Phosphatase: 83 U/L (ref 38–126)
BILIRUBIN DIRECT: 0.1 mg/dL (ref 0.0–0.2)
Indirect Bilirubin: 0.3 mg/dL (ref 0.3–0.9)
Total Bilirubin: 0.4 mg/dL (ref 0.3–1.2)
Total Protein: 5.9 g/dL — ABNORMAL LOW (ref 6.5–8.1)

## 2019-02-14 LAB — AFP TUMOR MARKER: AFP, Serum, Tumor Marker: 1.7 ng/mL (ref 0.0–8.3)

## 2019-02-14 MED ORDER — GERHARDT'S BUTT CREAM
TOPICAL_CREAM | Freq: Four times a day (QID) | CUTANEOUS | Status: DC
  Administered 2019-02-14 – 2019-02-19 (×16): via TOPICAL
  Filled 2019-02-14: qty 1

## 2019-02-14 MED ORDER — SODIUM CHLORIDE 0.9 % IV SOLN
500.0000 mg | Freq: Two times a day (BID) | INTRAVENOUS | Status: DC
  Administered 2019-02-14 – 2019-02-15 (×2): 500 mg via INTRAVENOUS
  Filled 2019-02-14 (×3): qty 0.5

## 2019-02-14 MED ORDER — SODIUM CHLORIDE 0.9 % IV SOLN
1.0000 g | Freq: Three times a day (TID) | INTRAVENOUS | Status: DC
  Administered 2019-02-14 – 2019-02-15 (×3): 1 g via INTRAVENOUS
  Filled 2019-02-14 (×3): qty 1000
  Filled 2019-02-14: qty 1

## 2019-02-14 NOTE — Plan of Care (Signed)
  Problem: Urinary Elimination: Goal: Signs and symptoms of infection will decrease Outcome: Progressing   Problem: Health Behavior/Discharge Planning: Goal: Ability to manage health-related needs will improve Outcome: Progressing   Problem: Clinical Measurements: Goal: Ability to maintain clinical measurements within normal limits will improve Outcome: Progressing Goal: Will remain free from infection Outcome: Progressing Goal: Diagnostic test results will improve Outcome: Progressing Goal: Respiratory complications will improve Outcome: Progressing Goal: Cardiovascular complication will be avoided Outcome: Progressing   Problem: Activity: Goal: Risk for activity intolerance will decrease Outcome: Progressing   Problem: Nutrition: Goal: Adequate nutrition will be maintained Outcome: Progressing   Problem: Coping: Goal: Level of anxiety will decrease Outcome: Progressing   Problem: Elimination: Goal: Will not experience complications related to bowel motility Outcome: Progressing Goal: Will not experience complications related to urinary retention Outcome: Progressing   Problem: Safety: Goal: Ability to remain free from injury will improve Outcome: Progressing   Problem: Skin Integrity: Goal: Risk for impaired skin integrity will decrease Outcome: Progressing

## 2019-02-14 NOTE — Progress Notes (Signed)
[facility] ROUNDING NOTE   Subjective:   Very pleasant 73 year old lady with a very complex urological history.  During February she underwent lithotripsy and placement of ureteral stent for chronic pelvic pain and urolithiasis.  She has had progressive renal insufficiency with a peak serum creatinine of 6 mg/dL.  With urology intervention and hydration his serum creatinine improved to 2.54. She was evaluated by Dr. Johnney Ou at [facility] on 02/03/2019 and was found to have a 20 pound weight gain and lower extremity edema.  She was placed on torsemide 80 mg twice daily for 5 days decreasing down to once daily. She had hydrodistention therapy on 02/09/2019.  She was admitted on 02/10/2019 and was found to have worsening serum creatinine with a CT scan revealing bilateral nonobstructing stones and right-sided mild to moderate hydronephrosis.  Urology has been consulted.  Her serum creatinine on admission was 4.22 improving to 3.8 at time of consultation by Dr. Joelyn Oms 02/11/2019  Allopurinol 50 mg daily, Rocephin 1 g every 24 hours,  Lasix 80 mg twice daily, simvastatin 40 mg daily insulin sliding scale, Synthroid 75 mcg daily Protonix 40 mg twice daily, fesoterodine 4 mg daily, mirabegron 50 mg daily, Flomax 0.4 mg daily iron sulfate 325 mg twice daily folic acid 1 mg daily T61 1 g daily.  Blood pressure 109/61 pulse 86 temperature 98.7 O2 sats 91% room air  Urine output 1950 cc 02/13/2019 weight 87.3 kg  Sodium 133 potassium 3.4 chloride 98 CO2 21 BUN 59 creatinine 3.58 glucose 110 calcium 9.4 phosphorus 2.8 albumin 2.6 WBC 6.2 hemoglobin 7.8 platelets 211   CT scan showed incidental finding of lingula like mass 1.6 to 1.4 cm dedicated chest CT scan is being recommended.  Bilateral renal calculi nonobstructive marked dilation of the right renal collecting system.  Chest x-ray revealed cardiomegaly      Objective:  Vital signs in last 24 hours:  Temp:  [98.1 F  (36.7 C)-98.4 F (36.9 C)] 98.3 F (36.8 C) (03/15 0906) Pulse Rate:  [82-86] 86 (03/15 0906) Resp:  [18-19] 18 (03/15 0906) BP: (109-122)/(61-66) 109/61 (03/15 0906) SpO2:  [91 %-97 %] 91 % (03/15 0906) Weight:  [87.3 kg] 87.3 kg (03/14 2041)  Weight change:  Filed Weights   02/11/19 1701 02/11/19 2119 02/13/19 2041  Weight: 87.5 kg 87.1 kg 87.3 kg    Intake/Output: I/O last 3 completed shifts: In: 1030 [P.O.:880; IV Piggyback:150] Out: 2325 [Urine:2325]   Intake/Output this shift:  No intake/output data recorded.  CVS-regular rate and rhythm no murmurs rubs or gallops pacemaker pocket RS-clear to auscultation with no wheezes or rales ABD- BS present soft non-distended EXT-3+ pitting edema   Basic Metabolic Panel: Recent Labs  Lab 02/10/19 1205 02/11/19 0459 02/12/19 0339 02/13/19 0535 02/14/19 0256  NA 133* 136 135 132* 133*  K 3.5 3.2* 3.0* 3.3* 3.4*  CL 103 105 102 99 98  CO2 18* 19* 16* 19* 21*  GLUCOSE 115* 117* 143* 107* 110*  BUN 64* 61* 60* 61* 59*  CREATININE 4.22* 3.81* 3.77* 3.58* 3.58*  CALCIUM 10.0 9.6 9.6 9.5 9.4  PHOS  --   --   --  3.4 2.8    Liver Function Tests: Recent Labs  Lab 02/10/19 1205 02/12/19 0339 02/13/19 0535 02/14/19 0256  AST 13* 12* 10* 10*  ALT 8 9 8 6   ALKPHOS 101 84 79 83  BILITOT 0.5 0.6 0.6 0.4  PROT 6.4* 6.0* 5.5* 5.9*  ALBUMIN 3.3* 2.7* 2.5*  2.5*  2.6*  2.6*   No results for input(s): LIPASE, AMYLASE in the last 168 hours. No results for input(s): AMMONIA in the last 168 hours.  CBC: Recent Labs  Lab 02/10/19 1205 02/11/19 0459 02/12/19 0751 02/13/19 0535 02/13/19 0942 02/14/19 0256  WBC 5.1 4.7 7.2 6.5  --  6.2  NEUTROABS 3.0  --   --   --   --   --   HGB 7.9* 8.2* 8.6* 7.7* 7.8* 7.8*  HCT 24.8* 25.0* 25.8* 23.2* 23.7* 24.5*  MCV 90.8 89.0 87.8 87.2  --  88.1  PLT 218 225 209 182  --  211    Cardiac Enzymes: No results for input(s): CKTOTAL, CKMB, CKMBINDEX, TROPONINI in the last 168  hours.  BNP: Invalid input(s): POCBNP  CBG: Recent Labs  Lab 02/13/19 0706 02/13/19 1117 02/13/19 1616 02/13/19 2042 02/14/19 0728  GLUCAP 94 122* 96 109* 104*    Microbiology: Results for orders placed or performed during the hospital encounter of 02/10/19  Culture, Urine     Status: Abnormal (Preliminary result)   Collection Time: 02/11/19 12:25 AM  Result Value Ref Range Status   Specimen Description URINE, CLEAN CATCH  Final   Special Requests NONE  Final   Culture (A)  Final    50,000 COLONIES/mL GRAM NEGATIVE RODS 20,000 COLONIES/mL ENTEROCOCCUS FAECALIS CULTURE REINCUBATED FOR BETTER GROWTH Performed at [facility], [place] [address], [place], [place] [address]    Report Status PENDING  Incomplete  Culture, blood (routine x 2)     Status: None (Preliminary result)   Collection Time: 02/11/19  3:10 PM  Result Value Ref Range Status   Specimen Description BLOOD RIGHT ANTECUBITAL  Final   Special Requests   Final    BOTTLES DRAWN AEROBIC ONLY Blood Culture adequate volume   Culture   Final    NO GROWTH 2 DAYS Performed at [facility], [place] [address], [place], [place] [address]    Report Status PENDING  Incomplete  Culture, blood (routine x 2)     Status: None (Preliminary result)   Collection Time: 02/11/19  3:15 PM  Result Value Ref Range Status   Specimen Description BLOOD RIGHT HAND  Final   Special Requests   Final    BOTTLES DRAWN AEROBIC ONLY Blood Culture adequate volume   Culture   Final    NO GROWTH 2 DAYS Performed at [facility], [place] [address] [place], [place] [address]    Report Status PENDING  Incomplete    Coagulation Studies: No results for input(s): LABPROT, INR in the last 72 hours.  Urinalysis: No results for input(s): COLORURINE, LABSPEC, PHURINE, GLUCOSEU, HGBUR, BILIRUBINUR, KETONESUR, PROTEINUR, UROBILINOGEN, NITRITE, LEUKOCYTESUR in the last 72 hours.  Invalid input(s): APPERANCEUR    Imaging: Dg Abd 1  View  Result Date: 02/13/2019 CLINICAL DATA:  Constipation EXAM: ABDOMEN - 1 VIEW COMPARISON:  CT abdomen/pelvis dated 02/10/2019 FINDINGS: Nonobstructive bowel gas pattern. Mild to moderate left colonic stool burden. Mild degenerative changes of the lumbar spine. IMPRESSION: Mild to moderate left colonic stool burden. Electronically Signed   By: Julian Hy M.D.   On: 02/13/2019 08:14     Medications:   . cefTRIAXone (ROCEPHIN)  IV 1 g (02/13/19 1421)  . ferric gluconate (FERRLECIT/NULECIT) IV 125 mg (02/14/19 0928)   . allopurinol  50 mg Oral Daily  . docusate sodium  100 mg Oral BID  . ferrous sulfate  325 mg Oral BID WC  .  fesoterodine  4 mg Oral Daily  . folic acid  1 mg Oral Daily  . furosemide  80 mg Oral BID  . heparin  5,000 Units Subcutaneous Q8H  . insulin aspart  0-9 Units Subcutaneous TID WC  . levothyroxine  75 mcg Oral Q0600  . mirabegron ER  50 mg Oral Daily  . pantoprazole  40 mg Oral BID  . polyethylene glycol  17 g Oral Daily  . senna-docusate  1 tablet Oral BID  . simvastatin  40 mg Oral QHS  . sodium bicarbonate  1,300 mg Oral BID  . sodium chloride flush  3 mL Intravenous Once  . tamsulosin  0.4 mg Oral Daily  . vitamin B-12  1,000 mcg Oral Daily   acetaminophen **OR** acetaminophen, morphine injection, ondansetron **OR** ondansetron (ZOFRAN) IV, oxyCODONE-acetaminophen  Assessment/ Plan:   Acute on chronic kidney disease stage IV.  Creatinine slowly improving.  Baseline serum creatinine about 2.5-3.0 from epic records.  She appears to have some hydroureter on the right side.  Markedly volume overloaded.  Etiology of her renal insufficiency appears to be secondary to stone disease and nephrolithiasis with obstruction.  The does not look to be any other complicating factors.  No M spike noted.  02/11/2019 urinalysis does not have any proteinuria or hematuria though chronic pyuria is present..  I would avoid nonsteroidal inflammatory disease to inhibitors  ARB use and Cox 2 inhibitors. Continues on Flomax 0.4 mg daily.  Creatinine is stooled 3.58.  Metabolic acidosis we will add sodium bicarbonate 650 mg 2 tablets twice daily  Hypokalemia replaced by primary service  Anemia T sat 7% IV iron administered   Nephrolithiasis.  Followed by urology.  PTH 57  Diabetes mellitus type 2 as per primary team  Hypertension appears adequately controlled  Anasarca.  Will transition to oral Lasix 80 mg twice a day.  This seems to be improving.  Cellulitis continues on Rocephin IV.  Pyuria urine culture.  Interstitial cystitis followed by urology receiving Hydro dilation therapy  Placement of permanent pacemaker 03/25/2018 Dr. Crissie Sickles  Possible lingular mass 1.4 x 1.6 cm noted on abdominal CT.  Recommending dedicated chest CT.  I would not use IV contrast    LOS: [place] @TODAY @9 :38 AM

## 2019-02-14 NOTE — Progress Notes (Addendum)
PROGRESS NOTE    Patient: Erika Fry                            PCP: [name], MD                    DOB: 06/18/1946            DOA: 02/10/2019 [id number]             DOS: 02/14/2019, 12:23 PM   LOS: 4 days   Date of Service: The patient was seen and examined on 02/14/2019  Subjective:   Patient seen. Dysuria persists. Edema persists.   Brief Narrative:  Pt. with PMH of CKD, DM; admitted on 02/10/2019, presented with complaint of swelling of the leg, was found to have volume overload secondary to acute on chronic diastolic CHF. Currently further plan is continue IV Lasix.  02/14/2019: Urine culture grew Enterobacter cloacae and faecalis.  Will start patient on IV antibiotics.  Welling of the legs persist with redness of the skin lower legs.   Principal Problem:   ARF (acute renal failure) (HCC) Active Problems:   CKD (chronic kidney disease), stage IV (HCC)   Diet-controlled diabetes mellitus ([place])   Hypothyroidism   Essential hypertension    Assessment & Plan:   Anasarca/ Bilateral pedal edema -Likely due to acute on chronic diastolic CHF, EKG, Essential hypertension -Continue home blood pressure medication. -continue with IV Lasix and monitor >> nephrology switching to Lasix 80 mg p.o. twice daily -Continue to monitor I's and O's, weight 02/14/2019: Likely multifactorial.  Patient has cardiac and renal problems.  Nephrology input is appreciated.  Continue diuresis.  Acute on chronic kidney disease stage IV. Chronic right-sided hydronephrosis Baseline serum creatinine 2.5 - 3.0 >>>  Suspect cardiorenal hemodynamics. Appreciate nephrology inputs   Hydronephrosis Suspect that the patient actually has chronic hydronephrosisd, discussed with Dr. Louis Meckel from urology.  Feels that the patient should be monitored for 48 hours more before considering PCN.  Patient did not tolerate stent in the past and had severe pain.  Cellulitis of bilateral lower  extremity. Treat with IV ceftriaxone. -Wound care team consulted  02/14/2019: We discontinue ceftriaxone.  Change antibiotics to IV ampicillin and meropenem based on urine culture results.  Complicated UTI:  Follow-up on culture.  On ceftriaxone 02/14/2019: Urine culture grew Enterobacter cloacae and faecalis.  Will discontinue ceftriaxone.  See antibiotics regimen as documented above (IV ampicillin and meropenem).   Interstitial cystitis. -With on and off acute on chronic suprapubic tenderness. Follows up with Dr. Louis Meckel urology. Recently had a bladder instillation. On 3 different medication for overactive bladder.   Type 2 Diabetes Mellitus, uncontroled with renal complication Diet controlled. Monitor. SSI    Acute on chronic anemia/anemia of chronic disease/iron deficiency anemia/anemia chronic kidney disease. -Monitoring H&H -1 dose of IV iron today -Continue p.o. iron supplements  Obesity Body mass index is 31.95 kg/m.  Dietary consultation   Pressure ulcer.  Bilateral buttocks.  Left thigh.  POA.   Constipation. Bowel regimen as well as enema. -Bonded well, has been having bowel movements  Pressure Injury 01/17/19 Stage I -  Intact skin with non-blanchable redness of a localized area usually over a bony prominence. (Active)  01/17/19 0230  Location: Buttocks  Location Orientation:   Staging: Stage I -  Intact skin with non-blanchable redness of a localized area usually over a bony prominence.  Wound Description (Comments):  Present on Admission:      Pressure Injury 02/11/19 Stage II -  Partial thickness loss of dermis presenting as a shallow open ulcer with a red, pink wound bed without slough. LEFT inner thigh-looks like a healed wound and has opened from moisture (a gash) (Active)  02/11/19 1600  Location: Thigh  Location Orientation: Anterior;Left  Staging: Stage II -  Partial thickness loss of dermis presenting as a shallow open ulcer with a red,  pink wound bed without slough.  Wound Description (Comments): LEFT inner thigh-looks like a healed wound and has opened from moisture (a gash)  Present on Admission: Yes     Diet: renal diet DVT Prophylaxis: subcutaneous Heparin  Advance goals of care discussion: full code  Family Communication: no family was present at bedside, at the time of interview.   Disposition:  This will depend on hospital course.  Consultants: nephrology Procedures: none  Procedures:   No admission procedures for hospital encounter.     Antimicrobials:  Anti-infectives (From admission, onward)   Start     Dose/Rate Route Frequency Ordered Stop   02/11/19 1300  cefTRIAXone (ROCEPHIN) 1 g in sodium chloride 0.9 % 100 mL IVPB    Note to Pharmacy:  And UTI   1 g 200 mL/hr over 30 Minutes Intravenous Every 24 hours 02/11/19 1247         Medication:  . allopurinol  50 mg Oral Daily  . docusate sodium  100 mg Oral BID  . ferrous sulfate  325 mg Oral BID WC  . fesoterodine  4 mg Oral Daily  . folic acid  1 mg Oral Daily  . furosemide  80 mg Oral BID  . heparin  5,000 Units Subcutaneous Q8H  . insulin aspart  0-9 Units Subcutaneous TID WC  . levothyroxine  75 mcg Oral Q0600  . mirabegron ER  50 mg Oral Daily  . pantoprazole  40 mg Oral BID  . polyethylene glycol  17 g Oral Daily  . senna-docusate  1 tablet Oral BID  . simvastatin  40 mg Oral QHS  . sodium bicarbonate  1,300 mg Oral BID  . sodium chloride flush  3 mL Intravenous Once  . tamsulosin  0.4 mg Oral Daily  . vitamin B-12  1,000 mcg Oral Daily    acetaminophen **OR** acetaminophen, morphine injection, ondansetron **OR** ondansetron (ZOFRAN) IV, oxyCODONE-acetaminophen     Objective:   Vitals:   02/13/19 1757 02/13/19 2041 02/14/19 0450 02/14/19 0906  BP: 113/66 122/61 111/65 109/61  Pulse: 82 85 84 86  Resp:  19 19 18   Temp: 98.1 F (36.7 C) 98.4 F (36.9 C) 98.3 F (36.8 C) 98.3 F (36.8 C)  TempSrc: Oral Oral  Oral Oral  SpO2: 97% 94% 93% 91%  Weight:  87.3 kg    Height:        Intake/Output Summary (Last 24 hours) at 02/14/2019 1223 Last data filed at 02/14/2019 1100 Gross per 24 hour  Intake 790 ml  Output 1275 ml  Net -485 ml   Filed Weights   02/11/19 1701 02/11/19 2119 02/13/19 2041  Weight: 87.5 kg 87.1 kg 87.3 kg     Examination:    General exam: Appears calm and comfortable  BP 109/61 (BP Location: Right Arm)   Pulse 86   Temp 98.3 F (36.8 C) (Oral)   Resp 18   Ht 5\' 5"  (1.651 m)   Wt 87.3 kg   SpO2 91%   BMI 32.03 kg/m  Physical Exam  Constitution:  Alert, cooperative, no distress,  Psychiatric: Normal and stable mood and affect, cognition intact,   HEENT: Normocephalic, PERRL, otherwise with in Normal limits  Chest:Chest symmetric Cardio vascular:  S1/S2, RRR, No murmure, No Rubs or Gallops  pulmonary: Clear to auscultation bilaterally, respirations unlabored, negative wheezes / crackles Abdomen: Soft, non-tender, non-distended, bowel sounds,no masses, no organomegaly Muscular skeletal: Limited exam - in bed, able to move all 4 extremities, Normal strength,  Neuro: CNII-XII intact. , normal motor and sensation, reflexes intact  Extremities:  Able to move all 4 extremities spontaneously, +2 pitting edema lower extremities, +2 pulses  Skin: Dry, warm to touch, negative for any Rashes, No open wounds Wounds: per nursing documentation  LABs:  CBC Latest Ref Rng & Units 02/14/2019 02/13/2019 02/13/2019  WBC 4.0 - 10.5 K/uL 6.2 - 6.5  Hemoglobin 12.0 - 15.0 g/dL 7.8(L) 7.8(L) 7.7(L)  Hematocrit 36.0 - 46.0 % 24.5(L) 23.7(L) 23.2(L)  Platelets 150 - 400 K/uL 211 - 182   CMP Latest Ref Rng & Units 02/14/2019 02/13/2019 02/12/2019  Glucose 70 - 99 mg/dL 110(H) 107(H) 143(H)  BUN 8 - 23 mg/dL 59(H) 61(H) 60(H)  Creatinine 0.44 - 1.00 mg/dL 3.58(H) 3.58(H) 3.77(H)  Sodium 135 - 145 mmol/L 133(L) 132(L) 135  Potassium 3.5 - 5.1 mmol/L 3.4(L) 3.3(L) 3.0(L)  Chloride 98  - 111 mmol/L 98 99 102  CO2 22 - 32 mmol/L 21(L) 19(L) 16(L)  Calcium 8.9 - 10.3 mg/dL 9.4 9.5 9.6  Total Protein 6.5 - 8.1 g/dL 5.9(L) 5.5(L) 6.0(L)  Total Bilirubin 0.3 - 1.2 mg/dL 0.4 0.6 0.6  Alkaline Phos 38 - 126 U/L 83 79 84  AST 15 - 41 U/L 10(L) 10(L) 12(L)  ALT 0 - 44 U/L 6 8 9

## 2019-02-14 NOTE — Consult Note (Signed)
[facility] Nurse wound consult note Reason for Consult:medial thigh full thickness wound, erythema at perineum from stooling Wound type:moisture associated skin damage (MASD) Pressure Injury POA: NA Measurement:4cm x 0.4c x 0.1cm with moist pink base and scant serous exudate Wound bed:as described above Drainage (amount, consistency, odor) as described above Periwound:erythematous in perineum from stooling Dressing procedure/placement/frequency: Patient with external urinary incontinence device (PurWick), is in agreement with topical cream. I will provide Gerhart's Butt cream, a 1:1:1 compounded preparation for hydrocortisone, lotrimin and zinc oxide.   [facility] nursing team will not follow, but will remain available to this patient, the nursing and medical teams.  Please re-consult if needed. Thanks, Maudie Flakes, MSN, RN, [place], [facility]  Pager# [phone]

## 2019-02-14 NOTE — Progress Notes (Signed)
Patient has had a lot of pain from her wound / excoriated skin in her perennial area.  Has asked for pain medicine every 6 hours.  She does not want the IV pain medicine.

## 2019-02-15 LAB — HEPATIC FUNCTION PANEL
ALT: 7 U/L (ref 0–44)
AST: 12 U/L — ABNORMAL LOW (ref 15–41)
Albumin: 2.5 g/dL — ABNORMAL LOW (ref 3.5–5.0)
Alkaline Phosphatase: 84 U/L (ref 38–126)
Total Bilirubin: 0.5 mg/dL (ref 0.3–1.2)
Total Protein: 6.3 g/dL — ABNORMAL LOW (ref 6.5–8.1)

## 2019-02-15 LAB — RENAL FUNCTION PANEL
Albumin: 2.5 g/dL — ABNORMAL LOW (ref 3.5–5.0)
Anion gap: 13 (ref 5–15)
BUN: 56 mg/dL — ABNORMAL HIGH (ref 8–23)
CO2: 20 mmol/L — ABNORMAL LOW (ref 22–32)
Calcium: 9.5 mg/dL (ref 8.9–10.3)
Chloride: 101 mmol/L (ref 98–111)
Creatinine, Ser: 3.66 mg/dL — ABNORMAL HIGH (ref 0.44–1.00)
GFR calc Af Amer: 13 mL/min — ABNORMAL LOW (ref >60)
GFR calc non Af Amer: 12 mL/min — ABNORMAL LOW (ref >60)
Glucose, Bld: 89 mg/dL (ref 70–99)
Phosphorus: 2.8 mg/dL (ref 2.5–4.6)
Potassium: 3.9 mmol/L (ref 3.5–5.1)
Sodium: 134 mmol/L — ABNORMAL LOW (ref 135–145)

## 2019-02-15 LAB — CBC
HEMATOCRIT: 24.5 % — AB (ref 36.0–46.0)
Hemoglobin: 7.8 g/dL — ABNORMAL LOW (ref 12.0–15.0)
MCH: 28.1 pg (ref 26.0–34.0)
MCHC: 31.8 g/dL (ref 30.0–36.0)
MCV: 88.1 fL (ref 80.0–100.0)
PLATELETS: 247 10*3/uL (ref 150–400)
RBC: 2.78 MIL/uL — ABNORMAL LOW (ref 3.87–5.11)
RDW: 13.8 % (ref 11.5–15.5)
WBC: 6.6 10*3/uL (ref 4.0–10.5)
nRBC: 0 % (ref 0.0–0.2)

## 2019-02-15 LAB — C-REACTIVE PROTEIN: CRP: 20.5 mg/dL — ABNORMAL HIGH (ref <1.0)

## 2019-02-15 LAB — GLUCOSE, CAPILLARY
Glucose-Capillary: 94 mg/dL (ref 70–99)
Glucose-Capillary: 103 mg/dL — ABNORMAL HIGH (ref 70–99)
Glucose-Capillary: 115 mg/dL — ABNORMAL HIGH (ref 70–99)
Glucose-Capillary: 87 mg/dL (ref 70–99)

## 2019-02-15 LAB — URIC ACID: Uric Acid, Serum: 8.7 mg/dL — ABNORMAL HIGH (ref 2.5–7.1)

## 2019-02-15 LAB — SEDIMENTATION RATE: Sed Rate: 97 mm/hr — ABNORMAL HIGH (ref 0–22)

## 2019-02-15 MED ORDER — DARBEPOETIN ALFA 200 MCG/0.4ML IJ SOSY
200.0000 ug | PREFILLED_SYRINGE | INTRAMUSCULAR | Status: DC
  Administered 2019-02-15: 200 ug via SUBCUTANEOUS
  Filled 2019-02-15: qty 0.4

## 2019-02-15 MED ORDER — SODIUM CHLORIDE 0.9 % IV SOLN
INTRAVENOUS | Status: DC | PRN
  Administered 2019-02-15: 500 mL via INTRAVENOUS

## 2019-02-15 MED ORDER — SODIUM CHLORIDE 0.9 % IV SOLN
1.0000 g | Freq: Two times a day (BID) | INTRAVENOUS | Status: DC
  Administered 2019-02-15 – 2019-02-16 (×2): 1 g via INTRAVENOUS
  Filled 2019-02-15: qty 1000
  Filled 2019-02-15: qty 1
  Filled 2019-02-15: qty 1000

## 2019-02-15 MED ORDER — ALBUMIN HUMAN 25 % IV SOLN
25.0000 g | Freq: Four times a day (QID) | INTRAVENOUS | Status: AC
  Administered 2019-02-16 – 2019-02-18 (×8): 25 g via INTRAVENOUS
  Filled 2019-02-15 (×8): qty 100

## 2019-02-15 MED ORDER — FUROSEMIDE 10 MG/ML IJ SOLN
80.0000 mg | Freq: Two times a day (BID) | INTRAMUSCULAR | Status: DC
  Administered 2019-02-16 – 2019-02-19 (×7): 80 mg via INTRAVENOUS
  Filled 2019-02-15 (×7): qty 8

## 2019-02-15 MED ORDER — CIPROFLOXACIN HCL 500 MG PO TABS: 500.0000 mg | ORAL_TABLET | Freq: Every day | ORAL | Status: DC

## 2019-02-15 MED ORDER — SODIUM CHLORIDE 0.9 % IV SOLN
500.0000 mg | INTRAVENOUS | Status: DC
  Administered 2019-02-15: 500 mg via INTRAVENOUS
  Filled 2019-02-15 (×2): qty 0.5

## 2019-02-15 MED ORDER — ALBUMIN HUMAN 25 % IV SOLN
25.0000 g | Freq: Four times a day (QID) | INTRAVENOUS | Status: DC
  Filled 2019-02-15 (×2): qty 100

## 2019-02-15 NOTE — Progress Notes (Signed)
Subjective:  1875 UOP - crt stable but poor last 48 hours.  She is eating lunch- very uncomfortable says knees hurt, she cannot walk, then cries in pain when passing urine Objective Vital signs in last 24 hours: Vitals:   02/14/19 1639 02/14/19 2136 02/15/19 0442 02/15/19 0930  BP: (!) 110/55 119/66 125/66 (!) 109/59  Pulse: 78 81 87 79  Resp: 18 17 16 18   Temp: 98.2 F (36.8 C) 98.7 F (37.1 C) 98.9 F (37.2 C) 98.1 F (36.7 C)  TempSrc: Oral Oral Oral Oral  SpO2: 92% 100% 94% 94%  Weight:  87.3 kg    Height:       Weight change: 0 kg  Intake/Output Summary (Last 24 hours) at 02/15/2019 1358 Last data filed at 02/15/2019 0900 Gross per 24 hour  Intake 1905.07 ml  Output 1875 ml  Net 30.07 ml    Assessment/ Plan: Pt is a 73 y.o. yo female with urologic history of having lithotripsy /ureteral stands for urolithiasis.  During that hosp had AKI - crt peaked at 6, then improved to 2.54 who was admitted on 02/10/2019 with volume overload and A on CRF- up over 4  Assessment/Plan: 1. Renal- baseline CKD- crt between 2.5 and 3 really since late 2019.  AKI in Feb- now with recurrent AKI- crt peaked at 4.2- improved but now stalled at 3.5- 3.6 over the last 48 hours.  It is unclear if there is really active obstruction happening , I think not- but now with UTI vs chronic cystitis- could be contributing - now on amp and meropenam.  No indication for dialysis and might not be great to introduce dialysis into this situation  2. HTN/vol- making good urine but weight is not decreasing- on oral lasix 80 BID - she says much better 3. Anemia- hgb in the 7's - on nulecit- will add ESA  4. Chest CT- confirmed lingular mass- for repeat imaging in 3 mos, this did not make her happy  5. Bilat knee pain- difficult to examine as she is tender- not red-  I think just due to being in bed for so long,     [facility]    Labs: Basic Metabolic Panel: Recent Labs  Lab 02/13/19 0535  02/14/19 0256 02/15/19 0242  NA 132* 133* 134*  K 3.3* 3.4* 3.9  CL 99 98 101  CO2 19* 21* 20*  GLUCOSE 107* 110* 89  BUN 61* 59* 56*  CREATININE 3.58* 3.58* 3.66*  CALCIUM 9.5 9.4 9.5  PHOS 3.4 2.8 2.8   Liver Function Tests: Recent Labs  Lab 02/13/19 0535 02/14/19 0256 02/15/19 0242  AST 10* 10* 12*  ALT 8 6 7   ALKPHOS 79 83 84  BILITOT 0.6 0.4 0.5  PROT 5.5* 5.9* 6.3*  ALBUMIN 2.5*  2.5* 2.6*  2.6* 2.5*  2.5*   No results for input(s): LIPASE, AMYLASE in the last 168 hours. No results for input(s): AMMONIA in the last 168 hours. CBC: Recent Labs  Lab 02/10/19 1205 02/11/19 0459 02/12/19 0751 02/13/19 0535 02/13/19 0942 02/14/19 0256 02/15/19 0242  WBC 5.1 4.7 7.2 6.5  --  6.2 6.6  NEUTROABS 3.0  --   --   --   --   --   --   HGB 7.9* 8.2* 8.6* 7.7* 7.8* 7.8* 7.8*  HCT 24.8* 25.0* 25.8* 23.2* 23.7* 24.5* 24.5*  MCV 90.8 89.0 87.8 87.2  --  88.1 88.1  PLT 218 225 209 182  --  211  247   Cardiac Enzymes: No results for input(s): CKTOTAL, CKMB, CKMBINDEX, TROPONINI in the last 168 hours. CBG: Recent Labs  Lab 02/14/19 1133 02/14/19 1637 02/14/19 2137 02/15/19 0722 02/15/19 1126  GLUCAP 102* 111* 88 94 103*    Iron Studies: No results for input(s): IRON, TIBC, TRANSFERRIN, FERRITIN in the last 72 hours. Studies/Results: Ct Chest High Resolution  Result Date: 02/15/2019 CLINICAL DATA:  Shortness of breath, lung nodule. EXAM: CT CHEST WITHOUT CONTRAST TECHNIQUE: Multidetector CT imaging of the chest was performed following the standard protocol without intravenous contrast. High resolution imaging of the lungs, as well as inspiratory and expiratory imaging, was performed. COMPARISON:  CT abdomen pelvis 02/10/2019. FINDINGS: Cardiovascular: Atherosclerotic calcification of the aorta aortic valve and coronary arteries. Heart is enlarged. Small to moderate pericardial effusion is new from 02/10/2019. Pulmonic trunk is enlarged. Mediastinum/Nodes: Mediastinal  lymph nodes measure up to 10 mm in right paratracheal station. Hilar regions are difficult to evaluate without IV contrast. No axillary adenopathy. Esophagus is grossly unremarkable. Lungs/Pleura: Negative for subpleural reticulation, traction bronchiectasis/bronchiolectasis, ground-glass, architectural distortion. Mild cylindrical bronchiectasis in both lower lobes with small bilateral pleural effusions, left greater than right, largely new 02/10/2019. Associated compressive atelectasis in both lower lobes. Lingular nodule measures 1.6 cm (series 4, image 87), as on 02/10/2019. Airway is unremarkable. No air trapping. Upper Abdomen: Visualized portions of the liver, gallbladder and adrenal glands are unremarkable. Right hydronephrosis, partially imaged. Left renal stone. Visualized portions of the spleen, pancreas and stomach are grossly unremarkable. Upper abdominal lymph nodes are not enlarged by CT size criteria. Musculoskeletal: Degenerative changes in the spine. No worrisome lytic or sclerotic lesions. IMPRESSION: 1. No evidence of fibrotic interstitial lung disease. 2. Lingular nodule. Consider one of the following in 3 months for both low-risk and high-risk individuals: (a) repeat chest CT or follow-up PET-CT. This recommendation follows the consensus statement: Guidelines for Management of Incidental Pulmonary Nodules Detected on CT Images: From the Fleischner Society 2017; Radiology 2017; 284:228-243. 3. Small to moderate pericardial effusion, new from 02/10/2019. 4. Small bilateral pleural effusions, left greater than right, largely new 02/10/2019. 5. Partially imaged right hydronephrosis, better seen and described on 02/10/2019. 6. Left renal stone. 7. Borderline enlarged right paratracheal lymph node, likely reactive. 8. Aortic atherosclerosis (ICD10-170.0). Coronary artery calcification. 9. Enlarged pulmonic trunk, indicative of pulmonary arterial hypertension. Electronically Signed   By: Lorin Picket M.D.   On: 02/15/2019 08:10   Medications: Infusions: . ampicillin (OMNIPEN) IV    . ferric gluconate (FERRLECIT/NULECIT) IV 125 mg (02/15/19 1028)  . meropenem (MERREM) IV      Scheduled Medications: . allopurinol  50 mg Oral Daily  . docusate sodium  100 mg Oral BID  . fesoterodine  4 mg Oral Daily  . folic acid  1 mg Oral Daily  . furosemide  80 mg Oral BID  . Gerhardt's butt cream   Topical QID  . heparin  5,000 Units Subcutaneous Q8H  . insulin aspart  0-9 Units Subcutaneous TID WC  . levothyroxine  75 mcg Oral Q0600  . mirabegron ER  50 mg Oral Daily  . pantoprazole  40 mg Oral BID  . polyethylene glycol  17 g Oral Daily  . senna-docusate  1 tablet Oral BID  . simvastatin  40 mg Oral QHS  . sodium bicarbonate  1,300 mg Oral BID  . sodium chloride flush  3 mL Intravenous Once  . tamsulosin  0.4 mg Oral Daily  . vitamin B-12  1,000  mcg Oral Daily    have reviewed scheduled and prn medications.  Physical Exam: General: alert, seems uncomfortable  Heart:RRR Lungs: mostly clear Abdomen: soft, non tender Extremities: min edema- tender- she says knees tender, not red, she will not move them      02/15/2019,1:58 PM  LOS: 5 days

## 2019-02-15 NOTE — Consult Note (Addendum)
Consult requested for thigh and buttocks.  This was performed yesterday by the [facility] team; please refer to previous consult note for assessment and plan of care. Please re-consult if further assistance is needed.  Thank-you,  Julien Girt MSN, [place], [place], [place], [place]

## 2019-02-15 NOTE — Progress Notes (Signed)
PROGRESS NOTE    Patient: Erika Fry                            PCP: [name], MD                    DOB: 1946/11/17            DOA: 02/10/2019 XBJ:[id number]             DOS: 02/15/2019, 8:44 AM   LOS: 5 days   Date of Service: The patient was seen and examined on 02/15/2019  Subjective:   Patient seen. Continues to report dysuria.     Bilateral leg cellulitis has also improved. Leg edema persists    Brief Narrative:  Patient is a 73 year old female with past medical history significant for CKD stage IV, diabetes mellitus, obesity, hypertension, hyperlipidemia, nephrolithiasis, chronic right-sided hydronephrosis, gout and arthritis.  Patient was admitted with worsening leg edema, volume overload, worsening renal function, bilateral lower leg cellulitis and dysuria.  Work-up has revealed UTI secondary to Enterobacter cloacae and faecalis.  Worsening renal function is noted.  CT scan renal stone done revealed lingular mass/nodule.  02/14/2019: Urine culture grew Enterobacter cloacae and faecalis.  Will start patient on IV antibiotics.  Swelling of the legs persists, with redness of the skin lower legs.  02/15/2019: CT chest without contrast revealed 1.6 cm lingular nodule.  Repeat work-up in 3 months is advised.  CT chest also revealed mild to moderate pericardial effusion as well as bilateral pleural effusion.  Albumin is low (2.5).  Cellulitis of the legs has improved with IV antibiotics (meropenem and ampicillin).  Dysuria persists.  Leg edema persists.  Will change Lasix to IV Lasix.  We will also give patient's 8 doses of albumin.  Monitor pulmonary status, renal renal function and volume status while on albumin.  We will get an echocardiogram for pericardial effusion, though, CT since 2 over exaggerated effusions.  Patient continues to report knee pain.  Based on history of gout, will proceed with checking uric acid level.  For sake of completeness, will add ESR, CRP and ANA.   Further management will depend on hospital course.  Guarded prognosis.   Principal Problem:   ARF (acute renal failure) (HCC) Active Problems:   CKD (chronic kidney disease), stage IV (HCC)   Diet-controlled diabetes mellitus ([place])   Hypothyroidism   Essential hypertension    Assessment & Plan:   Anasarca/ Bilateral pedal edema -Likely due to acute on chronic diastolic CHF, EKG, Essential hypertension -Continue home blood pressure medication. -continue with IV Lasix and monitor >> nephrology switching to Lasix 80 mg p.o. twice daily -Continue to monitor I's and O's, weight 02/14/2019: Likely multifactorial.  Patient has cardiac and renal problems.  Nephrology input is appreciated.  Continue diuresis. 02/15/2019: Refer to above documentation.  Acute on chronic kidney disease stage IV. Chronic right-sided hydronephrosis Baseline serum creatinine 2.5 - 3.0 >>>  Suspect cardiorenal hemodynamics. Appreciate nephrology inputs 02/15/2019: Neurology input is appreciated.  Continue to monitor renal function and electrolytes.  Hydronephrosis Suspect that the patient actually has chronic hydronephrosisd, discussed with Dr. Louis Meckel from urology.  Feels that the patient should be monitored for 48 hours more before considering PCN.  Patient did not tolerate stent in the past and had severe pain. 02/15/2019: Patient has chronic right-sided hydronephrosis.  CT scan has not revealed any new obstruction.  Cellulitis of bilateral lower extremity. Treat  with IV ceftriaxone. -Wound care team consulted  02/14/2019: We discontinue ceftriaxone.  Change antibiotics to IV ampicillin and meropenem based on urine culture results. 02/15/2019: This has improved significantly with current IV antibiotics.  Complicated UTI:  Follow-up on culture.  On ceftriaxone 02/14/2019: Urine culture grew Enterobacter cloacae and faecalis.  Will discontinue ceftriaxone.  See antibiotics regimen as documented above (IV  ampicillin and meropenem). 02/15/2019: Continue IV ampicillin and meropenem for now.  Interstitial cystitis. -With on and off acute on chronic suprapubic tenderness. Follows up with Dr. Louis Meckel urology. Recently had a bladder instillation. On 3 different medication for overactive bladder.  Type 2 Diabetes Mellitus, uncontroled with renal complication: Diet controlled. Monitor. SSI   Acute on chronic anemia/anemia of chronic disease/iron deficiency anemia/anemia chronic kidney disease: -Monitoring H&H -1 dose of IV iron today -Continue p.o. iron supplements -Etiology of anemia is likely multifactorial, including anemia of CKD.  Obesity: Body mass index is 31.95 kg/m.  Dietary consultation  Pressure ulcer:  Bilateral buttocks.  Left thigh.  Constipation. Bowel regimen as well as enema. -Bonded well, has been having bowel movements  Pressure Injury 01/17/19 Stage I -  Intact skin with non-blanchable redness of a localized area usually over a bony prominence. (Active)  01/17/19 0230  Location: Buttocks  Location Orientation:   Staging: Stage I -  Intact skin with non-blanchable redness of a localized area usually over a bony prominence.  Wound Description (Comments):   Present on Admission:      Pressure Injury 02/11/19 Stage II -  Partial thickness loss of dermis presenting as a shallow open ulcer with a red, pink wound bed without slough. LEFT inner thigh-looks like a healed wound and has opened from moisture (a gash) (Active)  02/11/19 1600  Location: Thigh  Location Orientation: Anterior;Left  Staging: Stage II -  Partial thickness loss of dermis presenting as a shallow open ulcer with a red, pink wound bed without slough.  Wound Description (Comments): LEFT inner thigh-looks like a healed wound and has opened from moisture (a gash)  Present on Admission: Yes     Diet: renal diet DVT Prophylaxis: subcutaneous Heparin  Advance goals of care discussion: full  code  Family Communication: no family was present at bedside, at the time of interview.   Disposition:  This will depend on hospital course.  Consultants: nephrology Procedures: none  Procedures:   No admission procedures for hospital encounter.     Antimicrobials:  Anti-infectives (From admission, onward)   Start     Dose/Rate Route Frequency Ordered Stop   02/15/19 1200  ciprofloxacin (CIPRO) tablet 500 mg     500 mg Oral Daily 02/15/19 0843     02/14/19 1400  ampicillin (OMNIPEN) 1 g in sodium chloride 0.9 % 100 mL IVPB  Status:  Discontinued    Note to Pharmacy:  Adjust as per renal function   1 g 300 mL/hr over 20 Minutes Intravenous Every 8 hours 02/14/19 1225 02/15/19 0843   02/14/19 1330  meropenem (MERREM) 500 mg in sodium chloride 0.9 % 100 mL IVPB  Status:  Discontinued    Note to Pharmacy:  Adjust as per renal function   500 mg 200 mL/hr over 30 Minutes Intravenous Every 12 hours 02/14/19 1225 02/15/19 0843   02/11/19 1300  cefTRIAXone (ROCEPHIN) 1 g in sodium chloride 0.9 % 100 mL IVPB  Status:  Discontinued    Note to Pharmacy:  And UTI   1 g 200 mL/hr over 30 Minutes Intravenous Every  24 hours 02/11/19 1247 02/14/19 1225       Medication:  . allopurinol  50 mg Oral Daily  . ciprofloxacin  500 mg Oral Q1200  . docusate sodium  100 mg Oral BID  . ferrous sulfate  325 mg Oral BID WC  . fesoterodine  4 mg Oral Daily  . folic acid  1 mg Oral Daily  . furosemide  80 mg Oral BID  . Gerhardt's butt cream   Topical QID  . heparin  5,000 Units Subcutaneous Q8H  . insulin aspart  0-9 Units Subcutaneous TID WC  . levothyroxine  75 mcg Oral Q0600  . mirabegron ER  50 mg Oral Daily  . pantoprazole  40 mg Oral BID  . polyethylene glycol  17 g Oral Daily  . senna-docusate  1 tablet Oral BID  . simvastatin  40 mg Oral QHS  . sodium bicarbonate  1,300 mg Oral BID  . sodium chloride flush  3 mL Intravenous Once  . tamsulosin  0.4 mg Oral Daily  . vitamin B-12   1,000 mcg Oral Daily    acetaminophen **OR** acetaminophen, morphine injection, ondansetron **OR** ondansetron (ZOFRAN) IV, oxyCODONE-acetaminophen     Objective:   Vitals:   02/14/19 0906 02/14/19 1639 02/14/19 2136 02/15/19 0442  BP: 109/61 (!) 110/55 119/66 125/66  Pulse: 86 78 81 87  Resp: 18 18 17 16   Temp: 98.3 F (36.8 C) 98.2 F (36.8 C) 98.7 F (37.1 C) 98.9 F (37.2 C)  TempSrc: Oral Oral Oral Oral  SpO2: 91% 92% 100% 94%  Weight:   87.3 kg   Height:        Intake/Output Summary (Last 24 hours) at 02/15/2019 0844 Last data filed at 02/15/2019 0600 Gross per 24 hour  Intake 2255.07 ml  Output 1875 ml  Net 380.07 ml   Filed Weights   02/11/19 2119 02/13/19 2041 02/14/19 2136  Weight: 87.1 kg 87.3 kg 87.3 kg     Examination:    General exam: Appears calm and comfortable  BP 125/66 (BP Location: Right Arm)   Pulse 87   Temp 98.9 F (37.2 C) (Oral)   Resp 16   Ht 5' 5"  (1.651 m)   Wt 87.3 kg   SpO2 94%   BMI 32.03 kg/m    Physical Exam  Constitution:  Alert, cooperative, no distress,  Psychiatric: Normal and stable mood and affect, cognition intact,   HEENT: Normocephalic, PERRL, otherwise with in Normal limits  Chest:Chest symmetric Cardio vascular:  S1/S2, RRR, No murmure, No Rubs or Gallops  pulmonary: Clear to auscultation bilaterally, respirations unlabored, negative wheezes / crackles Abdomen: Soft, non-tender, non-distended, bowel sounds,no masses, no organomegaly Muscular skeletal: Limited exam - in bed, able to move all 4 extremities, Normal strength,  Neuro: CNII-XII intact. , normal motor and sensation, reflexes intact  Extremities:  Able to move all 4 extremities spontaneously, +2 pitting edema lower extremities, +2 pulses  Skin: Dry, warm to touch, negative for any Rashes, No open wounds Wounds: per nursing documentation  LABs:  CBC Latest Ref Rng & Units 02/15/2019 02/14/2019 02/13/2019  WBC 4.0 - 10.5 K/uL 6.6 6.2 -  Hemoglobin  12.0 - 15.0 g/dL 7.8(L) 7.8(L) 7.8(L)  Hematocrit 36.0 - 46.0 % 24.5(L) 24.5(L) 23.7(L)  Platelets 150 - 400 K/uL 247 211 -   CMP Latest Ref Rng & Units 02/15/2019 02/14/2019 02/13/2019  Glucose 70 - 99 mg/dL 89 110(H) 107(H)  BUN 8 - 23 mg/dL 56(H) 59(H) 61(H)  Creatinine 0.44 -  1.00 mg/dL 3.66(H) 3.58(H) 3.58(H)  Sodium 135 - 145 mmol/L 134(L) 133(L) 132(L)  Potassium 3.5 - 5.1 mmol/L 3.9 3.4(L) 3.3(L)  Chloride 98 - 111 mmol/L 101 98 99  CO2 22 - 32 mmol/L 20(L) 21(L) 19(L)  Calcium 8.9 - 10.3 mg/dL 9.5 9.4 9.5  Total Protein 6.5 - 8.1 g/dL 6.3(L) 5.9(L) 5.5(L)  Total Bilirubin 0.3 - 1.2 mg/dL 0.5 0.4 0.6  Alkaline Phos 38 - 126 U/L 84 83 79  AST 15 - 41 U/L 12(L) 10(L) 10(L)  ALT 0 - 44 U/L 7 6 8

## 2019-02-16 ENCOUNTER — Inpatient Hospital Stay (HOSPITAL_COMMUNITY): Payer: Medicare Other

## 2019-02-16 DIAGNOSIS — I340 Nonrheumatic mitral (valve) insufficiency: Secondary | ICD-10-CM

## 2019-02-16 LAB — CULTURE, BLOOD (ROUTINE X 2)
Culture: NO GROWTH
Culture: NO GROWTH
Special Requests: ADEQUATE
Special Requests: ADEQUATE

## 2019-02-16 LAB — RENAL FUNCTION PANEL
ALBUMIN: 2.4 g/dL — AB (ref 3.5–5.0)
ANION GAP: 14 (ref 5–15)
BUN: 53 mg/dL — ABNORMAL HIGH (ref 8–23)
CO2: 22 mmol/L (ref 22–32)
Calcium: 9.4 mg/dL (ref 8.9–10.3)
Chloride: 97 mmol/L — ABNORMAL LOW (ref 98–111)
Creatinine, Ser: 3.54 mg/dL — ABNORMAL HIGH (ref 0.44–1.00)
GFR calc Af Amer: 14 mL/min — ABNORMAL LOW (ref >60)
GFR calc non Af Amer: 12 mL/min — ABNORMAL LOW (ref >60)
GLUCOSE: 109 mg/dL — AB (ref 70–99)
PHOSPHORUS: 3.7 mg/dL (ref 2.5–4.6)
Potassium: 3.3 mmol/L — ABNORMAL LOW (ref 3.5–5.1)
SODIUM: 133 mmol/L — AB (ref 135–145)

## 2019-02-16 LAB — ANTINUCLEAR ANTIBODIES, IFA: ANA Ab, IFA: NEGATIVE

## 2019-02-16 LAB — ECHOCARDIOGRAM COMPLETE
Height: 65 in
Weight: 2758.4 oz

## 2019-02-16 LAB — GLUCOSE, CAPILLARY
Glucose-Capillary: 114 mg/dL — ABNORMAL HIGH (ref 70–99)
Glucose-Capillary: 119 mg/dL — ABNORMAL HIGH (ref 70–99)
Glucose-Capillary: 243 mg/dL — ABNORMAL HIGH (ref 70–99)

## 2019-02-16 LAB — CBC
HCT: 24.8 % — ABNORMAL LOW (ref 36.0–46.0)
Hemoglobin: 8.2 g/dL — ABNORMAL LOW (ref 12.0–15.0)
MCH: 29.4 pg (ref 26.0–34.0)
MCHC: 33.1 g/dL (ref 30.0–36.0)
MCV: 88.9 fL (ref 80.0–100.0)
PLATELETS: 240 10*3/uL (ref 150–400)
RBC: 2.79 MIL/uL — ABNORMAL LOW (ref 3.87–5.11)
RDW: 13.9 % (ref 11.5–15.5)
WBC: 7.7 10*3/uL (ref 4.0–10.5)
nRBC: 0 % (ref 0.0–0.2)

## 2019-02-16 LAB — HEPATIC FUNCTION PANEL
ALBUMIN: 2.4 g/dL — AB (ref 3.5–5.0)
ALT: 9 U/L (ref 0–44)
AST: 12 U/L — ABNORMAL LOW (ref 15–41)
Alkaline Phosphatase: 84 U/L (ref 38–126)
Bilirubin, Direct: <0.1 mg/dL (ref 0.0–0.2)
TOTAL PROTEIN: 6 g/dL — AB (ref 6.5–8.1)
Total Bilirubin: 0.8 mg/dL (ref 0.3–1.2)

## 2019-02-16 MED ORDER — LEVOFLOXACIN 500 MG PO TABS
250.0000 mg | ORAL_TABLET | ORAL | Status: AC
  Administered 2019-02-16 – 2019-02-18 (×2): 250 mg via ORAL
  Filled 2019-02-16 (×3): qty 1

## 2019-02-16 MED ORDER — PREDNISONE 20 MG PO TABS
40.0000 mg | ORAL_TABLET | Freq: Every day | ORAL | Status: AC
  Administered 2019-02-17 – 2019-02-19 (×3): 40 mg via ORAL
  Filled 2019-02-16 (×3): qty 2

## 2019-02-16 MED ORDER — POTASSIUM CHLORIDE CRYS ER 20 MEQ PO TBCR
20.0000 meq | EXTENDED_RELEASE_TABLET | Freq: Two times a day (BID) | ORAL | Status: DC
  Administered 2019-02-16 – 2019-02-19 (×7): 20 meq via ORAL
  Filled 2019-02-16 (×7): qty 1

## 2019-02-16 MED ORDER — PREDNISONE 50 MG PO TABS
60.0000 mg | ORAL_TABLET | Freq: Once | ORAL | Status: AC
  Administered 2019-02-16: 60 mg via ORAL
  Filled 2019-02-16: qty 1

## 2019-02-16 MED ORDER — COLCHICINE 0.6 MG PO TABS
0.6000 mg | ORAL_TABLET | Freq: Every day | ORAL | Status: DC
  Administered 2019-02-17 – 2019-02-19 (×3): 0.6 mg via ORAL
  Filled 2019-02-16 (×4): qty 1

## 2019-02-16 MED ORDER — COLCHICINE 0.6 MG PO TABS
1.2000 mg | ORAL_TABLET | Freq: Once | ORAL | Status: AC
  Administered 2019-02-16: 1.2 mg via ORAL
  Filled 2019-02-16: qty 2

## 2019-02-16 NOTE — Progress Notes (Signed)
  Echocardiogram 2D Echocardiogram has been performed.  [name] 02/16/2019, 4:24 PM

## 2019-02-16 NOTE — Progress Notes (Signed)
Subjective:  3000 UOP - crt stable but poor last 72 hours.  She is better "but not as good as I have been "   Objective Vital signs in last 24 hours: Vitals:   02/15/19 1635 02/15/19 2043 02/16/19 0321 02/16/19 0931  BP: 136/63 (!) 166/94 135/64 126/65  Pulse: 92 92 91 84  Resp: 18 (!) 23 20 18   Temp: 98 F (36.7 C) 98 F (36.7 C) 98.4 F (36.9 C) 97.6 F (36.4 C)  TempSrc: Oral Oral Oral Oral  SpO2: 97% 100% 95% 96%  Weight:  78.2 kg    Height:       Weight change: -9.1 kg  Intake/Output Summary (Last 24 hours) at 02/16/2019 1306 Last data filed at 02/16/2019 0915 Gross per 24 hour  Intake 1969.45 ml  Output 3200 ml  Net -1230.55 ml    Assessment/ Plan: Pt is a 73 y.o. yo female with urologic history of having lithotripsy /ureteral stands for urolithiasis.  During that hosp had AKI - crt peaked at 6, then improved to 2.54 who was admitted on 02/10/2019 with volume overload and A on CRF- up over 4  Assessment/Plan: 1. Renal- baseline CKD- crt between 2.5 and 3 really since late 2019.  AKI in Feb- now with recurrent AKI- crt peaked at 4.2- improved but now stalled at 3.5- 3.6 over the last 72 hours.  It is unclear if there is really active obstruction happening , I think not- but now with UTI vs chronic cystitis- could be contributing - now on amp and meropenam.  No indication for dialysis and I am really NOT excited to introduce dialysis into this situation  2. HTN/vol- making good urine weight finally is decreasing- on IV lasix 80 BID now - she says much better.  I think could transition to PO lasix- not sure who changed it back ?  Can be converted to PO at time of discharge if preferred  3. Anemia- hgb in the 7's - on nulecit- have added ESA  4. Chest CT- confirmed lingular mass- for repeat imaging in 3 mos, this did not make her happy  5. Bilat knee pain- difficult to examine as she is tender- not red-  I think just due to being in bed for so long,  6. Hypokalemia- will give a  little supp today     Erika Fry    Labs: Basic Metabolic Panel: Recent Labs  Lab 02/14/19 0256 02/15/19 0242 02/16/19 0814  NA 133* 134* 133*  K 3.4* 3.9 3.3*  CL 98 101 97*  CO2 21* 20* 22  GLUCOSE 110* 89 109*  BUN 59* 56* 53*  CREATININE 3.58* 3.66* 3.54*  CALCIUM 9.4 9.5 9.4  PHOS 2.8 2.8 3.7   Liver Function Tests: Recent Labs  Lab 02/14/19 0256 02/15/19 0242 02/16/19 0814  AST 10* 12* 12*  ALT 6 7 9   ALKPHOS 83 84 84  BILITOT 0.4 0.5 0.8  PROT 5.9* 6.3* 6.0*  ALBUMIN 2.6*  2.6* 2.5*  2.5* 2.4*  2.4*   No results for input(s): LIPASE, AMYLASE in the last 168 hours. No results for input(s): AMMONIA in the last 168 hours. CBC: Recent Labs  Lab 02/10/19 1205  02/12/19 0751 02/13/19 0535  02/14/19 0256 02/15/19 0242 02/16/19 0814  WBC 5.1   < > 7.2 6.5  --  6.2 6.6 7.7  NEUTROABS 3.0  --   --   --   --   --   --   --  HGB 7.9*   < > 8.6* 7.7*   < > 7.8* 7.8* 8.2*  HCT 24.8*   < > 25.8* 23.2*   < > 24.5* 24.5* 24.8*  MCV 90.8   < > 87.8 87.2  --  88.1 88.1 88.9  PLT 218   < > 209 182  --  211 247 240   < > = values in this interval not displayed.   Cardiac Enzymes: No results for input(s): CKTOTAL, CKMB, CKMBINDEX, TROPONINI in the last 168 hours. CBG: Recent Labs  Lab 02/15/19 1126 02/15/19 1635 02/15/19 2048 02/16/19 0720 02/16/19 1108  GLUCAP 103* 115* 87 114* 119*    Iron Studies: No results for input(s): IRON, TIBC, TRANSFERRIN, FERRITIN in the last 72 hours. Studies/Results: No results found. Medications: Infusions: . sodium chloride 500 mL (02/15/19 2238)  . albumin human 25 g (02/16/19 0915)  . ampicillin (OMNIPEN) IV Stopped (02/16/19 0746)  . ferric gluconate (FERRLECIT/NULECIT) IV 125 mg (02/16/19 1101)  . meropenem (MERREM) IV Stopped (02/16/19 0012)    Scheduled Medications: . allopurinol  50 mg Oral Daily  . [START ON 02/17/2019] colchicine  0.6 mg Oral Daily  . darbepoetin (ARANESP) injection -  NON-DIALYSIS  200 mcg Subcutaneous Q Mon-1800  . docusate sodium  100 mg Oral BID  . fesoterodine  4 mg Oral Daily  . folic acid  1 mg Oral Daily  . furosemide  80 mg Intravenous BID  . Gerhardt's butt cream   Topical QID  . heparin  5,000 Units Subcutaneous Q8H  . insulin aspart  0-9 Units Subcutaneous TID WC  . levothyroxine  75 mcg Oral Q0600  . mirabegron ER  50 mg Oral Daily  . pantoprazole  40 mg Oral BID  . polyethylene glycol  17 g Oral Daily  . [START ON 02/17/2019] predniSONE  40 mg Oral QAC breakfast  . senna-docusate  1 tablet Oral BID  . simvastatin  40 mg Oral QHS  . sodium bicarbonate  1,300 mg Oral BID  . sodium chloride flush  3 mL Intravenous Once  . tamsulosin  0.4 mg Oral Daily  . vitamin B-12  1,000 mcg Oral Daily    have reviewed scheduled and prn medications.  Physical Exam: General: alert, seems better Heart:RRR Lungs: mostly clear Abdomen: soft, non tender Extremities: min edema- tender- she says knees tender, not red, she will not move them      02/16/2019,1:06 PM  LOS: 6 days

## 2019-02-16 NOTE — Progress Notes (Signed)
[facility]                                                                              Patient Demographics  Erika Fry, is a 73 y.o. female, DOB - 07-11-1946, EHO:[id number]  Admit date - 02/10/2019   Admitting Physician Rise Patience, MD  Outpatient Primary MD for the patient is Avva, Steva Ready, MD  Outpatient specialists:   LOS - 6  days   Medical records reviewed and are as summarized below:    Chief Complaint  Patient presents with  . Leg Swelling       Brief summary   Patient is a 73 year old female with past medical history significant for CKD stage IV, diabetes mellitus, obesity, hypertension, hyperlipidemia, nephrolithiasis, chronic right-sided hydronephrosis, gout and arthritis.  Patient was admitted with worsening leg edema, volume overload, worsening renal function, bilateral lower leg cellulitis and dysuria.  Work-up has revealed UTI secondary to Enterobacter cloacae and faecalis.  CT scan renal stone done revealed lingular mass/nodule.   Assessment & Plan    Principal Problem: Acute on chronic kidney disease stage IV with anasarca, bilateral pedal edema, acute on chronic diastolic CHF -Patient was placed on IV Lasix, nephrology was consulted, transition to oral Lasix 80 mg twice daily -Continue to monitor I's and O's and daily weights Baseline creatinine 2.5-3.0, currently 3.54, on diuresis.  Nephrology following  Active problems Hydronephrosis -CT renal stone study on 3/11 showed moderate to marked chronic dilatation of the right renal collecting system, bilateral renal calculi, no obstructive uropathy. - Dr Candis Musa with Dr. Louis Meckel from urology. Feels that the patient should be monitored for 48 hours more before considering PCN. Patient did not tolerate stent in the past and had severe pain.  Bilateral lower extremity cellulitis -Patient was initially placed on IV ceftriaxone, then changed to IV ampicillin and  meropenem on 3/15 -Urine culture showed Enterobacter and enterococcus, discussed with pharmacy, narrow antibiotics to Levaquin for 4 more days.   Enterobacter and enterococcus UTI with history of interstitial cystitis -Patient was placed on IV ampicillin and meropenem on 3/15, discussed with pharmacy, narrow antibiotics down to Levaquin for 4 more days to complete 7 days of therapy. -Follows urology, Dr. Louis Meckel  Diabetes mellitus type 2, uncontrolled with renal complications, CKD -Continue sliding scale insulin   Acute on chronic anemia/anemia of chronic disease, from CKD/iron deficiency anemia -Monitor H&H, currently stable, hemoglobin 8.2   Obesity: Body mass index is 31.95 kg/m.  Pressure ulcer: Bilateral buttocks. Left thigh.  Constipation. Bowel regimen as well as enema. -Bonded well, has been having bowel movements  Acute gout, right worse than left knee -ESR, CRP, uric acid elevated -Started on colchicine and prednisone.  No NSAIDs due to CKD  Pressure Injury 01/17/19 Stage I - Intact skin with non-blanchable redness of a localized area usually over a bony prominence. (Active)  01/17/19 0230  Location: Buttocks  Location Orientation:   Staging: Stage I - Intact skin with non-blanchable redness of a localized area usually over a bony prominence.  Wound Description (Comments):   Present on Admission:     Pressure Injury 02/11/19 Stage II - Partial  thickness loss of dermis presenting as a shallow open ulcer with a red, pink wound bed without slough. LEFT inner thigh-looks like a healed wound and has opened from moisture (a gash) (Active)  02/11/19 1600  Location: Thigh  Location Orientation: Anterior;Left  Staging: Stage II - Partial thickness loss of dermis presenting as a shallow open ulcer with a red, pink wound bed without slough.  Wound Description (Comments): LEFT inner thigh-looks like a healed wound and has opened from moisture (a gash)   Present on Admission: Yes    Code Status: full  DVT Prophylaxis: Heparin subcu Family Communication: Discussed in detail with the patient, all imaging results, lab results explained to the patient    Disposition Plan:   Time Spent in minutes     Procedures:    Consultants:   Nephrology  Antimicrobials:   Anti-infectives (From admission, onward)   Start     Dose/Rate Route Frequency Ordered Stop   02/16/19 1500  levofloxacin (LEVAQUIN) tablet 250 mg     250 mg Oral Every 48 hours 02/16/19 1413 02/20/19 1459   02/15/19 2200  meropenem (MERREM) 500 mg in sodium chloride 0.9 % 100 mL IVPB  Status:  Discontinued    Note to Pharmacy:  Adjust as per renal function   500 mg 200 mL/hr over 30 Minutes Intravenous Every 24 hours 02/15/19 1012 02/16/19 1413   02/15/19 1700  ampicillin (OMNIPEN) 1 g in sodium chloride 0.9 % 100 mL IVPB  Status:  Discontinued    Note to Pharmacy:  Adjust as per renal function   1 g 300 mL/hr over 20 Minutes Intravenous Every 12 hours 02/15/19 1012 02/16/19 1413   02/15/19 1200  ciprofloxacin (CIPRO) tablet 500 mg  Status:  Discontinued     500 mg Oral Daily 02/15/19 0843 02/15/19 1009   02/14/19 1400  ampicillin (OMNIPEN) 1 g in sodium chloride 0.9 % 100 mL IVPB  Status:  Discontinued    Note to Pharmacy:  Adjust as per renal function   1 g 300 mL/hr over 20 Minutes Intravenous Every 8 hours 02/14/19 1225 02/15/19 0843   02/14/19 1330  meropenem (MERREM) 500 mg in sodium chloride 0.9 % 100 mL IVPB  Status:  Discontinued    Note to Pharmacy:  Adjust as per renal function   500 mg 200 mL/hr over 30 Minutes Intravenous Every 12 hours 02/14/19 1225 02/15/19 0843   02/11/19 1300  cefTRIAXone (ROCEPHIN) 1 g in sodium chloride 0.9 % 100 mL IVPB  Status:  Discontinued    Note to Pharmacy:  And UTI   1 g 200 mL/hr over 30 Minutes Intravenous Every 24 hours 02/11/19 1247 02/14/19 1225          Medications  Scheduled Meds: . allopurinol  50 mg Oral  Daily  . [START ON 02/17/2019] colchicine  0.6 mg Oral Daily  . darbepoetin (ARANESP) injection - NON-DIALYSIS  200 mcg Subcutaneous Q Mon-1800  . docusate sodium  100 mg Oral BID  . fesoterodine  4 mg Oral Daily  . folic acid  1 mg Oral Daily  . furosemide  80 mg Intravenous BID  . Gerhardt's butt cream   Topical QID  . heparin  5,000 Units Subcutaneous Q8H  . insulin aspart  0-9 Units Subcutaneous TID WC  . levofloxacin  250 mg Oral Q48H  . levothyroxine  75 mcg Oral Q0600  . mirabegron ER  50 mg Oral Daily  . pantoprazole  40 mg Oral BID  .  polyethylene glycol  17 g Oral Daily  . potassium chloride  20 mEq Oral BID  . [START ON 02/17/2019] predniSONE  40 mg Oral QAC breakfast  . senna-docusate  1 tablet Oral BID  . simvastatin  40 mg Oral QHS  . sodium bicarbonate  1,300 mg Oral BID  . sodium chloride flush  3 mL Intravenous Once  . tamsulosin  0.4 mg Oral Daily  . vitamin B-12  1,000 mcg Oral Daily   Continuous Infusions: . sodium chloride 500 mL (02/15/19 2238)  . albumin human 25 g (02/16/19 0915)  . ferric gluconate (FERRLECIT/NULECIT) IV 125 mg (02/16/19 1101)   PRN Meds:.sodium chloride, acetaminophen **OR** acetaminophen, morphine injection, ondansetron **OR** ondansetron (ZOFRAN) IV, oxyCODONE-acetaminophen      Subjective:   Erika Fry was seen and examined today.  Feeling miserable with the bilateral knee pain, right worse than left.  No fevers. Patient denies dizziness, chest pain, shortness of breath, abdominal pain, N/V.  No acute issues overnight  Objective:   Vitals:   02/15/19 1635 02/15/19 2043 02/16/19 0321 02/16/19 0931  BP: 136/63 (!) 166/94 135/64 126/65  Pulse: 92 92 91 84  Resp: 18 (!) 23 20 18   Temp: 98 F (36.7 C) 98 F (36.7 C) 98.4 F (36.9 C) 97.6 F (36.4 C)  TempSrc: Oral Oral Oral Oral  SpO2: 97% 100% 95% 96%  Weight:  78.2 kg    Height:        Intake/Output Summary (Last 24 hours) at 02/16/2019 1423 Last data filed at  02/16/2019 0915 Gross per 24 hour  Intake 1909.45 ml  Output 3200 ml  Net -1290.55 ml     Wt Readings from Last 3 Encounters:  02/15/19 78.2 kg  01/28/19 83.8 kg  01/14/19 83 kg     Exam  General: Alert and oriented x 3, NAD  Eyes:   HEENT:  Atraumatic, normocephalic  Cardiovascular: S1 S2 auscultated, Regular rate and rhythm.  Respiratory: Clear to auscultation bilaterally, no wheezing, rales or rhonchi  Gastrointestinal: Soft, nontender, nondistended, + bowel sounds  Ext: Difficult ROM bilateral knees due to pain  Neuro: No new deficits  Musculoskeletal: No digital cyanosis, clubbing  Skin: No rashes  Psych: Normal affect and demeanor, alert and oriented x3    Data Reviewed:  I have personally reviewed following labs and imaging studies  Micro Results Recent Results (from the past 240 hour(s))  Culture, Urine     Status: Abnormal   Collection Time: 02/11/19 12:25 AM  Result Value Ref Range Status   Specimen Description URINE, CLEAN CATCH  Final   Special Requests   Final    NONE Performed at [facility], [address], [place], [place] [address]    Culture (A)  Final    50,000 COLONIES/mL ENTEROBACTER CLOACAE 20,000 COLONIES/mL ENTEROCOCCUS FAECALIS    Report Status 02/14/2019 FINAL  Final   Organism ID, Bacteria ENTEROBACTER CLOACAE (A)  Final   Organism ID, Bacteria ENTEROCOCCUS FAECALIS (A)  Final      Susceptibility   Enterobacter cloacae - MIC*    CEFAZOLIN >=64 RESISTANT Resistant     CEFTRIAXONE >=64 RESISTANT Resistant     CIPROFLOXACIN <=0.25 SENSITIVE Sensitive     GENTAMICIN <=1 SENSITIVE Sensitive     IMIPENEM 1 SENSITIVE Sensitive     NITROFURANTOIN 128 RESISTANT Resistant     TRIMETH/SULFA <=20 SENSITIVE Sensitive     PIP/TAZO >=128 RESISTANT Resistant     * 50,000 COLONIES/mL ENTEROBACTER CLOACAE  Enterococcus faecalis - MIC*    AMPICILLIN <=2 SENSITIVE Sensitive     LEVOFLOXACIN 1 SENSITIVE Sensitive     NITROFURANTOIN  <=16 SENSITIVE Sensitive     VANCOMYCIN 1 SENSITIVE Sensitive     * 20,000 COLONIES/mL ENTEROCOCCUS FAECALIS  Culture, blood (routine x 2)     Status: None   Collection Time: 02/11/19  3:10 PM  Result Value Ref Range Status   Specimen Description BLOOD RIGHT ANTECUBITAL  Final   Special Requests   Final    BOTTLES DRAWN AEROBIC ONLY Blood Culture adequate volume   Culture   Final    NO GROWTH 5 DAYS Performed at [facility], [place] [address], [place], [place] [address]    Report Status 02/16/2019 FINAL  Final  Culture, blood (routine x 2)     Status: None   Collection Time: 02/11/19  3:15 PM  Result Value Ref Range Status   Specimen Description BLOOD RIGHT HAND  Final   Special Requests   Final    BOTTLES DRAWN AEROBIC ONLY Blood Culture adequate volume   Culture   Final    NO GROWTH 5 DAYS Performed at [facility], [place] [address], [place], [place] [address]    Report Status 02/16/2019 FINAL  Final    Radiology Reports Ct Abdomen Pelvis Wo Contrast  Result Date: 01/19/2019 CLINICAL DATA:  Fever unknown origin EXAM: CT ABDOMEN AND PELVIS WITHOUT CONTRAST TECHNIQUE: Multidetector CT imaging of the abdomen and pelvis was performed following the standard protocol without IV contrast. COMPARISON:  CT abdomen pelvis 05/13/2018.  Lumbar MRI 08/31/2018 FINDINGS: Lower chest: Mild bibasilar atelectasis and small pleural effusions bilaterally. Pacemaker. Hepatobiliary: No focal liver lesion. Upper abdominal evaluation limited due to overlying arms and artifact. Gallbladder distended with layering sludge or small gallstones. No gallbladder wall thickening or biliary dilatation. Pancreas: Negative Spleen: Negative Adrenals/Urinary Tract: Right ureteral stent in good position. Mild right hydronephrosis. Calculus in the right renal pelvis appears smaller compared with the prior study with interval lithotripsy noted. Stranding around the right kidney and ureter could represent infection.  Correlate with urinalysis. 6 mm left upper pole calculus. No left-sided obstruction. Foley catheter in the bladder with the bladder empty. Stomach/Bowel: Negative for bowel obstruction. No bowel mass or edema. Vascular/Lymphatic: Mild atherosclerotic disease without aortic aneurysm. No mass or adenopathy. Reproductive: Normal uterus.  No pelvic mass. Other: Mild presacral edema. No pelvic fluid collection or abscess identified. Musculoskeletal: Disc space narrowing and endplate erosion at C3-7. Findings are similar to the prior MRI and consistent with discitis/osteomyelitis which may be chronic. IMPRESSION: : IMPRESSION: 1. Findings compatible with discitis/osteomyelitis at L4-5. Findings similar to the prior lumbar MRI. Repeat lumbar MRI without intravenous contrast given renal insufficiency recommended. 2. Probable gallstones without evidence of cholecystitis. 3. Right ureteral stent. Mild right hydronephrosis and hydroureter. Decreased right renal calculus following lithotripsy. Recommend correlation with urinalysis to rule out infection. Electronically Signed   By: Franchot Gallo M.D.   On: 01/19/2019 17:09   Dg Chest 2 View  Result Date: 02/10/2019 CLINICAL DATA:  Acute onset of bilateral lower extremity edema. EXAM: CHEST - 2 VIEW COMPARISON:  Chest radiograph performed 01/16/2019. FINDINGS: The lungs are well-aerated and clear. There is no evidence of focal opacification, pleural effusion or pneumothorax. The heart is mildly enlarged. A pacemaker is noted at the left chest wall, with leads ending at the right atrium and right ventricle. No acute osseous abnormalities are seen. IMPRESSION: Mild cardiomegaly. Lungs remain  grossly clear. Electronically Signed   By: Garald Balding M.D.   On: 02/10/2019 17:43   Dg Abd 1 View  Result Date: 02/13/2019 CLINICAL DATA:  Constipation EXAM: ABDOMEN - 1 VIEW COMPARISON:  CT abdomen/pelvis dated 02/10/2019 FINDINGS: Nonobstructive bowel gas pattern. Mild to  moderate left colonic stool burden. Mild degenerative changes of the lumbar spine. IMPRESSION: Mild to moderate left colonic stool burden. Electronically Signed   By: Julian Hy M.D.   On: 02/13/2019 08:14   Mr Lumbar Spine Wo Contrast  Result Date: 01/22/2019 CLINICAL DATA:  History of discitis/osteomyelitis at L4-5. EXAM: MRI LUMBAR SPINE WITHOUT CONTRAST TECHNIQUE: Multiplanar, multisequence MR imaging of the lumbar spine was performed. No intravenous contrast was administered. COMPARISON:  MRI lumbar spine since 05/13/2018 and 08/31/2018 FINDINGS: Examination is somewhat degraded by motion artifact, particularly the axial images. Segmentation: There are five lumbar type vertebral bodies. The last full intervertebral disc space is labeled L5-S1. This correlates with the prior MRIs. Alignment:  Normal Vertebrae: Resolution of the diskitis and osteomyelitis at L4-5. No residual marrow edema or paraspinal process. No findings for epidural abscess. Chronic endplate reactive changes at L4-5. Conus medullaris and cauda equina: Conus extends to the L1 level. Conus and cauda equina appear normal. Paraspinal and other soft tissues: No signal paraspinal findings. No psoas muscle abscess or myositis. Persistent extrarenal pelvis on the right side. No adenopathy or aortic aneurysm. Disc levels: T12-L1: Stable small left paracentral disc extrusion with minimal impression on the left side of thecal sac. L1-2: Diffuse annular bulge and osteophytic ridging with mild bilateral lateral recess encroachment. There is also a persistent broad-based right foraminal, extraforaminal and far lateral disc protrusion. Possibly irritating the right L1 nerve root. L2-3: Diffuse bulging annulus with mild bilateral lateral recess stenosis, left greater than right. There is also mild left foraminal stenosis. L3-4: Diffuse bulging annulus and mild facet disease contributing to moderate bilateral lateral recess stenosis. Small focal  extraforaminal disc protrusion on the right comes close to contacting the right L3 nerve root. This appears stable. L4-5: Collapsed disc space related to prior diskitis and osteomyelitis. No findings for residual infection or inflammation. Degenerated bulging annulus but no spinal or significant lateral recess stenosis. There is mild persistent foraminal and extraforaminal encroachment on the right L4 nerve root. Moderate facet disease. L5-S1: No significant findings. IMPRESSION: 1. Resolution of diskitis and osteomyelitis at L4-5. 2. Stable multilevel disc protrusions with lateral recess and foraminal stenosis as detailed above. No new or progressive findings. Electronically Signed   By: Marijo Sanes M.D.   On: 01/22/2019 11:32   US Renal  Result Date: 01/18/2019 CLINICAL DATA:  Acute renal injury EXAM: RENAL / URINARY TRACT ULTRASOUND COMPLETE COMPARISON:  CT AP 05/13/2018 FINDINGS: Right Kidney: Renal measurements: 11.6 x 6.5 x 6.1 cm = volume: 237 mL. Increased echogenicity. No obstructive uropathy or mass. Left Kidney: Renal measurements: 9.6 x 4.8 x 4.5 cm = volume: 108 mL. Increased echogenicity. No obstructive uropathy or mass. Bladder: Decompressed by Foley catheter. IMPRESSION: 1. Increased renal echogenicity which can be seen in medical renal disease. 2. No obstructive uropathy or renal mass. 3. Decompressed urinary bladder with Foley. Electronically Signed   By: Ashley Royalty M.D.   On: 01/18/2019 03:06   Ct Chest High Resolution  Result Date: 02/15/2019 CLINICAL DATA:  Shortness of breath, lung nodule. EXAM: CT CHEST WITHOUT CONTRAST TECHNIQUE: Multidetector CT imaging of the chest was performed following the standard protocol without intravenous contrast. High resolution imaging of  the lungs, as well as inspiratory and expiratory imaging, was performed. COMPARISON:  CT abdomen pelvis 02/10/2019. FINDINGS: Cardiovascular: Atherosclerotic calcification of the aorta aortic valve and coronary  arteries. Heart is enlarged. Small to moderate pericardial effusion is new from 02/10/2019. Pulmonic trunk is enlarged. Mediastinum/Nodes: Mediastinal lymph nodes measure up to 10 mm in right paratracheal station. Hilar regions are difficult to evaluate without IV contrast. No axillary adenopathy. Esophagus is grossly unremarkable. Lungs/Pleura: Negative for subpleural reticulation, traction bronchiectasis/bronchiolectasis, ground-glass, architectural distortion. Mild cylindrical bronchiectasis in both lower lobes with small bilateral pleural effusions, left greater than right, largely new 02/10/2019. Associated compressive atelectasis in both lower lobes. Lingular nodule measures 1.6 cm (series 4, image 87), as on 02/10/2019. Airway is unremarkable. No air trapping. Upper Abdomen: Visualized portions of the liver, gallbladder and adrenal glands are unremarkable. Right hydronephrosis, partially imaged. Left renal stone. Visualized portions of the spleen, pancreas and stomach are grossly unremarkable. Upper abdominal lymph nodes are not enlarged by CT size criteria. Musculoskeletal: Degenerative changes in the spine. No worrisome lytic or sclerotic lesions. IMPRESSION: 1. No evidence of fibrotic interstitial lung disease. 2. Lingular nodule. Consider one of the following in 3 months for both low-risk and high-risk individuals: (a) repeat chest CT or follow-up PET-CT. This recommendation follows the consensus statement: Guidelines for Management of Incidental Pulmonary Nodules Detected on CT Images: From the Fleischner Society 2017; Radiology 2017; 284:228-243. 3. Small to moderate pericardial effusion, new from 02/10/2019. 4. Small bilateral pleural effusions, left greater than right, largely new 02/10/2019. 5. Partially imaged right hydronephrosis, better seen and described on 02/10/2019. 6. Left renal stone. 7. Borderline enlarged right paratracheal lymph node, likely reactive. 8. Aortic atherosclerosis  (ICD10-170.0). Coronary artery calcification. 9. Enlarged pulmonic trunk, indicative of pulmonary arterial hypertension. Electronically Signed   By: Lorin Picket M.D.   On: 02/15/2019 08:10   Mr Knee Left Wo Contrast  Result Date: 01/21/2019 CLINICAL DATA:  Fever of unknown origin. Severe knee pain. Negative left knee aspiration yesterday. EXAM: MRI OF THE LEFT KNEE WITHOUT CONTRAST TECHNIQUE: Multiplanar, multisequence MR imaging of the knee was performed. No intravenous contrast was administered. COMPARISON:  Left knee x-rays dated January 20, 2019. FINDINGS: MENISCI Medial meniscus: Longitudinal tear of the posterior horn. Radial tear of the body. Lateral meniscus:  Intact. LIGAMENTS Cruciates:  Intact ACL and PCL. Collaterals: Medial collateral ligament is intact. Lateral collateral ligament complex is intact. CARTILAGE Patellofemoral: Diffuse moderate to high-grade partial-thickness cartilage loss. Medial: Large areas of full-thickness cartilage loss over the weight-bearing medial femoral condyle and medial tibial plateau. Lateral: Partial-thickness cartilage loss along the mesial aspect of the lateral compartment. Joint: Small joint effusion. Normal Hoffa's fat. No plical thickening. Popliteal Fossa:  No Baker cyst. Intact popliteus tendon. Extensor Mechanism: Intact quadriceps tendon and patellar tendon. Intact medial and lateral patellar retinaculum. Intact MPFL. Bones: No acute fracture or dislocation. No focal bone lesion. Tricompartmental osteophytes. Degenerative subchondral marrow edema in the peripheral medial femoral condyle and medial tibial plateau. Other: Mild soft tissue swelling about the knee. No fluid collection. No muscle edema. IMPRESSION: 1. No discrete evidence of soft tissue infection about the knee. 2. Longitudinal tear of the medial meniscus posterior horn. Radial tear of the medial meniscus body. 3. Tricompartmental osteoarthritis, moderate in the medial and patellofemoral  compartments. Electronically Signed   By: Titus Dubin M.D.   On: 01/21/2019 15:23   Dg Knee Complete 4 Views Left  Result Date: 01/20/2019 CLINICAL DATA:  Severe LEFT knee pain for awhile,  fluid drained from LEFT knee this morning, persistent pain especially with any movement, no known injury EXAM: LEFT KNEE - COMPLETE 4+ VIEW COMPARISON:  None FINDINGS: Osseous demineralization. Joint space narrowing and spur formation greatest at medial compartment. No acute fracture, dislocation, or bone destruction. Scattered soft tissue swelling. Small metallic foreign body at the medial soft tissues of the distal thigh. No significant joint effusion. IMPRESSION: Osteoarthritic changes and osseous demineralization of the LEFT knee. No acute abnormalities. Electronically Signed   By: Lavonia Dana M.D.   On: 01/20/2019 16:15   Ct Renal Stone Study  Result Date: 02/10/2019 CLINICAL DATA:  Bilateral leg swelling since coming off of Lasix. Erythema of the left leg also noted. EXAM: CT ABDOMEN AND PELVIS WITHOUT CONTRAST TECHNIQUE: Multidetector CT imaging of the abdomen and pelvis was performed following the standard protocol without IV contrast. COMPARISON:  01/19/2019 FINDINGS: Lower chest: Noncalcified masslike opacity in the lingula is identified measuring 16 x 14 mm, series 7/. Bibasilar dependent atelectasis is identified. Possible additional tiny nodule in posterior right lower lobe measuring 3 mm. Left main and three-vessel coronary arteriosclerosis. Right-sided pacer leads are present. No significant pericardial effusion or thickening. Small hiatal hernia. Hepatobiliary: The unenhanced liver is unremarkable without apparent mass or biliary dilatation given limitations of a noncontrast study. Gallbladder contains layering sludge and/or calculi. No secondary signs of acute cholecystitis. A calcification is noted in the duodenum possibly representing a distal common bowel duct stone measuring 6 mm. Correlate with  liver function tests. If necessary, MRCP or ERCP may help further assessment, series 3/39. Pancreas: Atrophic without inflammation, ductal dilatation or mass. Spleen: No splenomegaly Adrenals/Urinary Tract: Normal bilateral adrenal glands. A 5 mm nonobstructing left upper pole renal calculus is identified. At least 4-5 renal stones are noted in the interpolar aspect of the right kidney, the largest measuring up to approximately 4 mm with moderate to marked dilatation of the right renal collecting system. Right-sided urothelial thickening is noted the level of the urinary bladder. The bladder is also thickened in appearance some which is due to underdistention but cystitis is also possibility. No definite ureteral stones are identified. Stomach/Bowel: Small hiatal hernia. Physiologic distention of the stomach with normal small bowel rotation. No small bowel obstruction are inflammation. Moderate to marked stool retention throughout the colon consistent with constipation. Appendix appears normal. Vascular/Lymphatic: Aortic atherosclerosis. No enlarged abdominal or pelvic lymph nodes. Reproductive: Uterus and bilateral adnexa are unremarkable. Other: Presacral edema.  No free air nor free fluid. Musculoskeletal: Lumbar spondylosis most marked at L4-5 with discogenic sclerosis. IMPRESSION: 1. **An incidental finding of potential clinical significance has been found. Lingular masslike abnormality measuring 16 x 14 mm, partially included on this study. Dedicated chest CT is recommended. Findings raise concern for pulmonary neoplasm. Additional tiny 3 mm nodule in the subpleural right lower lobe is indeterminate for neoplasm versus post inflammatory/infectious change.** 2. Bilateral renal calculi are identified without obstructive uropathy. Moderate-to-marked chronic dilatation of the right renal collecting system with diffuse urothelial and bladder wall thickening raise concern for stigmata urinary tract infection.  Correlate. 3. Stool is noted throughout the colon consistent with constipation. 4. 6 mm calcification in the region of the second portion of the duodenum may represent choledocholithiasis. Correlate with liver function tests. Additional layering gallstones and calculi without secondary signs of cholecystitis are noted within the gallbladder. Electronically Signed   By: Ashley Royalty M.D.   On: 02/10/2019 20:54    Lab Data:  CBC: Recent Labs  Lab 02/10/19  1205  02/12/19 0751 02/13/19 0535 02/13/19 0942 02/14/19 0256 02/15/19 0242 02/16/19 0814  WBC 5.1   < > 7.2 6.5  --  6.2 6.6 7.7  NEUTROABS 3.0  --   --   --   --   --   --   --   HGB 7.9*   < > 8.6* 7.7* 7.8* 7.8* 7.8* 8.2*  HCT 24.8*   < > 25.8* 23.2* 23.7* 24.5* 24.5* 24.8*  MCV 90.8   < > 87.8 87.2  --  88.1 88.1 88.9  PLT 218   < > 209 182  --  211 247 240   < > = values in this interval not displayed.   Basic Metabolic Panel: Recent Labs  Lab 02/12/19 0339 02/13/19 0535 02/14/19 0256 02/15/19 0242 02/16/19 0814  NA 135 132* 133* 134* 133*  K 3.0* 3.3* 3.4* 3.9 3.3*  CL 102 99 98 101 97*  CO2 16* 19* 21* 20* 22  GLUCOSE 143* 107* 110* 89 109*  BUN 60* 61* 59* 56* 53*  CREATININE 3.77* 3.58* 3.58* 3.66* 3.54*  CALCIUM 9.6 9.5 9.4 9.5 9.4  PHOS  --  3.4 2.8 2.8 3.7   GFR: Estimated Creatinine Clearance: 14.4 mL/min (A) (by C-G formula based on SCr of 3.54 mg/dL (H)). Liver Function Tests: Recent Labs  Lab 02/12/19 0339 02/13/19 0535 02/14/19 0256 02/15/19 0242 02/16/19 0814  AST 12* 10* 10* 12* 12*  ALT 9 8 6 7 9   ALKPHOS 84 79 83 84 84  BILITOT 0.6 0.6 0.4 0.5 0.8  PROT 6.0* 5.5* 5.9* 6.3* 6.0*  ALBUMIN 2.7* 2.5*  2.5* 2.6*  2.6* 2.5*  2.5* 2.4*  2.4*   No results for input(s): LIPASE, AMYLASE in the last 168 hours. No results for input(s): AMMONIA in the last 168 hours. Coagulation Profile: No results for input(s): INR, PROTIME in the last 168 hours. Cardiac Enzymes: No results for input(s):  CKTOTAL, CKMB, CKMBINDEX, TROPONINI in the last 168 hours. BNP (last 3 results) No results for input(s): PROBNP in the last 8760 hours. HbA1C: No results for input(s): HGBA1C in the last 72 hours. CBG: Recent Labs  Lab 02/15/19 1126 02/15/19 1635 02/15/19 2048 02/16/19 0720 02/16/19 1108  GLUCAP 103* 115* 87 114* 119*   Lipid Profile: No results for input(s): CHOL, HDL, LDLCALC, TRIG, CHOLHDL, LDLDIRECT in the last 72 hours. Thyroid Function Tests: No results for input(s): TSH, T4TOTAL, FREET4, T3FREE, THYROIDAB in the last 72 hours. Anemia Panel: No results for input(s): VITAMINB12, FOLATE, FERRITIN, TIBC, IRON, RETICCTPCT in the last 72 hours. Urine analysis:    Component Value Date/Time   COLORURINE YELLOW 02/10/2019 1726   APPEARANCEUR TURBID (A) 02/10/2019 1726   LABSPEC 1.008 02/10/2019 1726   PHURINE 5.0 02/10/2019 1726   GLUCOSEU NEGATIVE 02/10/2019 1726   HGBUR SMALL (A) 02/10/2019 1726   BILIRUBINUR NEGATIVE 02/10/2019 1726   KETONESUR NEGATIVE 02/10/2019 1726   PROTEINUR 30 (A) 02/10/2019 1726   NITRITE NEGATIVE 02/10/2019 1726   LEUKOCYTESUR LARGE (A) 02/10/2019 1726     Felix Pratt M.D. [facility] 02/16/2019, 2:23 PM  Pager: [phone] Between 7am to 7pm - call Pager - [phone]  After 7pm go to [email or web address] - password [email or web address]  Call night coverage person covering after 7pm

## 2019-02-16 NOTE — Care Management Important Message (Signed)
Important Message  Patient Details  Name: Erika [facility] MRN: [id number] Date of Birth: 1946-01-24   Medicare Important Message Given:  Yes    [name] 02/16/2019, 3:41 PM

## 2019-02-16 NOTE — Progress Notes (Signed)
Urine cx grew out enterobacter and enterococcus. Currently on merrem and amp. D/w Dr Tana Coast, we will optimize to levaquin for 4 more days to complete 7d of therapy.   Dc Merrem/amp Levaquin 250mg  PO q48 x2  Onnie Boer, PharmD, [place], AAHIVP, CPP Infectious Disease Pharmacist 02/16/2019 2:15 PM

## 2019-02-17 LAB — HEPATIC FUNCTION PANEL
ALT: 7 U/L (ref 0–44)
AST: 11 U/L — ABNORMAL LOW (ref 15–41)
Albumin: 3.3 g/dL — ABNORMAL LOW (ref 3.5–5.0)
Alkaline Phosphatase: 80 U/L (ref 38–126)
Bilirubin, Direct: <0.1 mg/dL (ref 0.0–0.2)
Total Bilirubin: 0.9 mg/dL (ref 0.3–1.2)
Total Protein: 6.8 g/dL (ref 6.5–8.1)

## 2019-02-17 LAB — GLUCOSE, CAPILLARY
GLUCOSE-CAPILLARY: 172 mg/dL — AB (ref 70–99)
Glucose-Capillary: 158 mg/dL — ABNORMAL HIGH (ref 70–99)
Glucose-Capillary: 129 mg/dL — ABNORMAL HIGH (ref 70–99)
Glucose-Capillary: 169 mg/dL — ABNORMAL HIGH (ref 70–99)
Glucose-Capillary: 173 mg/dL — ABNORMAL HIGH (ref 70–99)

## 2019-02-17 LAB — RENAL FUNCTION PANEL
Albumin: 3.3 g/dL — ABNORMAL LOW (ref 3.5–5.0)
Anion gap: 16 — ABNORMAL HIGH (ref 5–15)
BUN: 56 mg/dL — ABNORMAL HIGH (ref 8–23)
CALCIUM: 9.7 mg/dL (ref 8.9–10.3)
CO2: 23 mmol/L (ref 22–32)
Chloride: 91 mmol/L — ABNORMAL LOW (ref 98–111)
Creatinine, Ser: 3.42 mg/dL — ABNORMAL HIGH (ref 0.44–1.00)
GFR calc Af Amer: 15 mL/min — ABNORMAL LOW (ref >60)
GFR calc non Af Amer: 13 mL/min — ABNORMAL LOW (ref >60)
Glucose, Bld: 145 mg/dL — ABNORMAL HIGH (ref 70–99)
Phosphorus: 3.4 mg/dL (ref 2.5–4.6)
Potassium: 3.8 mmol/L (ref 3.5–5.1)
Sodium: 130 mmol/L — ABNORMAL LOW (ref 135–145)

## 2019-02-17 NOTE — Progress Notes (Signed)
[facility]                                                                              Patient Demographics  Erika Fry, is a 73 y.o. female, DOB - 1946/11/22, MHD:[id number]  Admit date - 02/10/2019   Admitting Physician Rise Patience, MD  Outpatient Primary MD for the patient is Avva, Steva Ready, MD  Outpatient specialists:   LOS - 7  days   Medical records reviewed and are as summarized below:    Chief Complaint  Patient presents with  . Leg Swelling       Brief summary   Patient is a 73 year old female with past medical history significant for CKD stage IV, diabetes mellitus, obesity, hypertension, hyperlipidemia, nephrolithiasis, chronic right-sided hydronephrosis, gout and arthritis.  Patient was admitted with worsening leg edema, volume overload, worsening renal function, bilateral lower leg cellulitis and dysuria.  Work-up has revealed UTI secondary to Enterobacter cloacae and faecalis.  CT scan renal stone done revealed lingular mass/nodule.  02/17/2019: Echocardiogram done revealed diastolic dysfunction, with normal EF.  Leg edema is improving.  Good urine output.  Patient continues to improve.  Serum creatinine is on the downward trend.  We will continue diuretics, and continue to monitor renal function.  Consider changing diuretics to oral torsemide on discharge if okay with nephrology (considering difference in pharmacokinetics).  Will consult physical therapy to assess patient and advised him appropriate disposition.  Knee pain has improved.  Overall, patient looks much better, but long-term prognosis remains guarded.  Low threshold to consider palliative care follow-up on discharge.   Assessment & Plan    Principal Problem: Acute on chronic kidney disease stage IV with anasarca, bilateral pedal edema, acute on chronic diastolic CHF: -Continue IV diuretics for now.   -Continue to monitor urine output, volume status and serum creatinine.    -Serum creatinine is on the downward trend for now, despite being on diuretics, component of cardiorenal syndrome remains a possibility.   -Consider changing diuretics to oral torsemide on discharge if okay with nephrology.   -Nephrology input is highly appreciated.    Active problems Hydronephrosis -CT renal stone study on 3/11 showed moderate to marked chronic dilatation of the right renal collecting system, bilateral renal calculi, no obstructive uropathy. -Follow-up with urology on discharge.  Patient also has chronic dysuria.    Bilateral lower extremity cellulitis -Patient was initially placed on IV ceftriaxone, then changed to IV ampicillin and meropenem on 3/15 -Urine culture showed Enterobacter and enterococcus, discussed with pharmacy, narrow antibiotics to Levaquin for 4 more days. -02/17/2019: Cellulitis has improved significantly.  Enterobacter and enterococcus UTI with history of interstitial cystitis -Patient was placed on IV ampicillin and meropenem on 02/14/2019, discussed with pharmacy, narrow antibiotics down to Levaquin for 4 more days to complete 7 days of therapy. -Follows urology, Dr. Louis Meckel  Diabetes mellitus type 2, uncontrolled with renal complications, CKD -Continue sliding scale insulin  Acute on chronic anemia/anemia of chronic disease, from CKD/iron deficiency anemia -Monitor H&H, currently stable, hemoglobin 8.2  Obesity: Body mass index is 31.95 kg/m.  Pressure ulcer: Bilateral buttocks. Left thigh.  Constipation. Bowel regimen  as well as enema.  Acute gout arthritis, right worse than left knee: -ESR, CRP, uric acid elevated -Started on colchicine and prednisone.  No NSAIDs due to CKD  Pressure Injury 01/17/19 Stage I - Intact skin with non-blanchable redness of a localized area usually over a bony prominence. (Active)  01/17/19 0230  Location: Buttocks  Location Orientation:   Staging: Stage I - Intact skin with non-blanchable  redness of a localized area usually over a bony prominence.  Wound Description (Comments):   Present on Admission:     Pressure Injury 02/11/19 Stage II - Partial thickness loss of dermis presenting as a shallow open ulcer with a red, pink wound bed without slough. LEFT inner thigh-looks like a healed wound and has opened from moisture (a gash) (Active)  02/11/19 1600  Location: Thigh  Location Orientation: Anterior;Left  Staging: Stage II - Partial thickness loss of dermis presenting as a shallow open ulcer with a red, pink wound bed without slough.  Wound Description (Comments): LEFT inner thigh-looks like a healed wound and has opened from moisture (a gash)  Present on Admission: Yes    Code Status: full  DVT Prophylaxis: Heparin subcu Family Communication:   Disposition Plan:   Time Spent in minutes     Procedures:    Consultants:   Nephrology  Antimicrobials:   Anti-infectives (From admission, onward)   Start     Dose/Rate Route Frequency Ordered Stop   02/16/19 1500  levofloxacin (LEVAQUIN) tablet 250 mg     250 mg Oral Every 48 hours 02/16/19 1413 02/20/19 1459   02/15/19 2200  meropenem (MERREM) 500 mg in sodium chloride 0.9 % 100 mL IVPB  Status:  Discontinued    Note to Pharmacy:  Adjust as per renal function   500 mg 200 mL/hr over 30 Minutes Intravenous Every 24 hours 02/15/19 1012 02/16/19 1413   02/15/19 1700  ampicillin (OMNIPEN) 1 g in sodium chloride 0.9 % 100 mL IVPB  Status:  Discontinued    Note to Pharmacy:  Adjust as per renal function   1 g 300 mL/hr over 20 Minutes Intravenous Every 12 hours 02/15/19 1012 02/16/19 1413   02/15/19 1200  ciprofloxacin (CIPRO) tablet 500 mg  Status:  Discontinued     500 mg Oral Daily 02/15/19 0843 02/15/19 1009   02/14/19 1400  ampicillin (OMNIPEN) 1 g in sodium chloride 0.9 % 100 mL IVPB  Status:  Discontinued    Note to Pharmacy:  Adjust as per renal function   1 g 300 mL/hr over 20 Minutes Intravenous Every 8  hours 02/14/19 1225 02/15/19 0843   02/14/19 1330  meropenem (MERREM) 500 mg in sodium chloride 0.9 % 100 mL IVPB  Status:  Discontinued    Note to Pharmacy:  Adjust as per renal function   500 mg 200 mL/hr over 30 Minutes Intravenous Every 12 hours 02/14/19 1225 02/15/19 0843   02/11/19 1300  cefTRIAXone (ROCEPHIN) 1 g in sodium chloride 0.9 % 100 mL IVPB  Status:  Discontinued    Note to Pharmacy:  And UTI   1 g 200 mL/hr over 30 Minutes Intravenous Every 24 hours 02/11/19 1247 02/14/19 1225         Medications  Scheduled Meds: . allopurinol  50 mg Oral Daily  . colchicine  0.6 mg Oral Daily  . darbepoetin (ARANESP) injection - NON-DIALYSIS  200 mcg Subcutaneous Q Mon-1800  . docusate sodium  100 mg Oral BID  . fesoterodine  4 mg Oral  Daily  . folic acid  1 mg Oral Daily  . furosemide  80 mg Intravenous BID  . Gerhardt's butt cream   Topical QID  . heparin  5,000 Units Subcutaneous Q8H  . insulin aspart  0-9 Units Subcutaneous TID WC  . levofloxacin  250 mg Oral Q48H  . levothyroxine  75 mcg Oral Q0600  . mirabegron ER  50 mg Oral Daily  . pantoprazole  40 mg Oral BID  . polyethylene glycol  17 g Oral Daily  . potassium chloride  20 mEq Oral BID  . predniSONE  40 mg Oral QAC breakfast  . senna-docusate  1 tablet Oral BID  . simvastatin  40 mg Oral QHS  . sodium bicarbonate  1,300 mg Oral BID  . sodium chloride flush  3 mL Intravenous Once  . tamsulosin  0.4 mg Oral Daily  . vitamin B-12  1,000 mcg Oral Daily   Continuous Infusions: . sodium chloride 500 mL (02/15/19 2238)  . albumin human 25 g (02/17/19 1105)  . ferric gluconate (FERRLECIT/NULECIT) IV 125 mg (02/17/19 0936)   PRN Meds:.sodium chloride, acetaminophen **OR** acetaminophen, morphine injection, ondansetron **OR** ondansetron (ZOFRAN) IV, oxyCODONE-acetaminophen      Subjective:   Dera Vanaken was seen and examined today.  Feeling miserable with the bilateral knee pain, right worse than left.   No fevers. Patient denies dizziness, chest pain, shortness of breath, abdominal pain, N/V.  No acute issues overnight  Objective:   Vitals:   02/16/19 1627 02/16/19 2058 02/17/19 0401 02/17/19 0919  BP: 133/82 (!) 154/77 (!) 149/80 (!) 157/66  Pulse: 79 81 75 79  Resp: _0 Temp: 97.9 F (36.6 C) (!) 97.5 F (36.4 C) 98.1 F (36.7 C) 98.1 F (36.7 C)  TempSrc: Oral Oral Oral Oral  SpO2: 97% 98% 99% 98%  Weight:      Height:        Intake/Output Summary (Last 24 hours) at 02/17/2019 1506 Last data filed at 02/17/2019 1452 Gross per 24 hour  Intake 920 ml  Output 3950 ml  Net -3030 ml     Wt Readings from Last 3 Encounters:  02/15/19 78.2 kg  01/28/19 83.8 kg  01/14/19 83 kg     Exam  General: Alert and oriented x 3, NAD.  Patient is obese.  HEENT: Pallor.  No jaundice.    Cardiovascular: S1 S2 auscultated, Regular rate and rhythm.  Respiratory: Clear to auscultation bilaterally  Gastrointestinal: Obese, soft, nontender.  Organs are difficult to assess. + bowel sounds  Ext: Difficult ROM bilateral knees due to pain.  Edema is improving.  Cellulitis has improved significantly.  Neuro: Awake and alert.  Patient moves all limbs.    Data Reviewed:  I have personally reviewed following labs and imaging studies  Micro Results Recent Results (from the past 240 hour(s))  Culture, Urine     Status: Abnormal   Collection Time: 02/11/19 12:25 AM  Result Value Ref Range Status   Specimen Description URINE, CLEAN CATCH  Final   Special Requests   Final    NONE Performed at [facility], [address], [place], [place] [address]    Culture (A)  Final    50,000 COLONIES/mL ENTEROBACTER CLOACAE 20,000 COLONIES/mL ENTEROCOCCUS FAECALIS    Report Status 02/14/2019 FINAL  Final   Organism ID, Bacteria ENTEROBACTER CLOACAE (A)  Final   Organism ID, Bacteria ENTEROCOCCUS FAECALIS (A)  Final      Susceptibility  Enterobacter cloacae - MIC*    CEFAZOLIN  >=64 RESISTANT Resistant     CEFTRIAXONE >=64 RESISTANT Resistant     CIPROFLOXACIN <=0.25 SENSITIVE Sensitive     GENTAMICIN <=1 SENSITIVE Sensitive     IMIPENEM 1 SENSITIVE Sensitive     NITROFURANTOIN 128 RESISTANT Resistant     TRIMETH/SULFA <=20 SENSITIVE Sensitive     PIP/TAZO >=128 RESISTANT Resistant     * 50,000 COLONIES/mL ENTEROBACTER CLOACAE   Enterococcus faecalis - MIC*    AMPICILLIN <=2 SENSITIVE Sensitive     LEVOFLOXACIN 1 SENSITIVE Sensitive     NITROFURANTOIN <=16 SENSITIVE Sensitive     VANCOMYCIN 1 SENSITIVE Sensitive     * 20,000 COLONIES/mL ENTEROCOCCUS FAECALIS  Culture, blood (routine x 2)     Status: None   Collection Time: 02/11/19  3:10 PM  Result Value Ref Range Status   Specimen Description BLOOD RIGHT ANTECUBITAL  Final   Special Requests   Final    BOTTLES DRAWN AEROBIC ONLY Blood Culture adequate volume   Culture   Final    NO GROWTH 5 DAYS Performed at [facility] [address] [place], [place] [address]    Report Status 02/16/2019 FINAL  Final  Culture, blood (routine x 2)     Status: None   Collection Time: 02/11/19  3:15 PM  Result Value Ref Range Status   Specimen Description BLOOD RIGHT HAND  Final   Special Requests   Final    BOTTLES DRAWN AEROBIC ONLY Blood Culture adequate volume   Culture   Final    NO GROWTH 5 DAYS Performed at [facility], [place] [address] [place], [place] [address]    Report Status 02/16/2019 FINAL  Final    Radiology Reports Ct Abdomen Pelvis Wo Contrast  Result Date: 01/19/2019 CLINICAL DATA:  Fever unknown origin EXAM: CT ABDOMEN AND PELVIS WITHOUT CONTRAST TECHNIQUE: Multidetector CT imaging of the abdomen and pelvis was performed following the standard protocol without IV contrast. COMPARISON:  CT abdomen pelvis 05/13/2018.  Lumbar MRI 08/31/2018 FINDINGS: Lower chest: Mild bibasilar atelectasis and small pleural effusions bilaterally. Pacemaker. Hepatobiliary: No focal liver lesion. Upper  abdominal evaluation limited due to overlying arms and artifact. Gallbladder distended with layering sludge or small gallstones. No gallbladder wall thickening or biliary dilatation. Pancreas: Negative Spleen: Negative Adrenals/Urinary Tract: Right ureteral stent in good position. Mild right hydronephrosis. Calculus in the right renal pelvis appears smaller compared with the prior study with interval lithotripsy noted. Stranding around the right kidney and ureter could represent infection. Correlate with urinalysis. 6 mm left upper pole calculus. No left-sided obstruction. Foley catheter in the bladder with the bladder empty. Stomach/Bowel: Negative for bowel obstruction. No bowel mass or edema. Vascular/Lymphatic: Mild atherosclerotic disease without aortic aneurysm. No mass or adenopathy. Reproductive: Normal uterus.  No pelvic mass. Other: Mild presacral edema. No pelvic fluid collection or abscess identified. Musculoskeletal: Disc space narrowing and endplate erosion at E7-5. Findings are similar to the prior MRI and consistent with discitis/osteomyelitis which may be chronic. IMPRESSION: : IMPRESSION: 1. Findings compatible with discitis/osteomyelitis at L4-5. Findings similar to the prior lumbar MRI. Repeat lumbar MRI without intravenous contrast given renal insufficiency recommended. 2. Probable gallstones without evidence of cholecystitis. 3. Right ureteral stent. Mild right hydronephrosis and hydroureter. Decreased right renal calculus following lithotripsy. Recommend correlation with urinalysis to rule out infection. Electronically Signed   By: Franchot Gallo M.D.   On: 01/19/2019 17:09   Dg Chest 2 View  Result Date: 02/10/2019 CLINICAL DATA:  Acute onset of bilateral lower extremity edema. EXAM: CHEST - 2 VIEW COMPARISON:  Chest radiograph performed 01/16/2019. FINDINGS: The lungs are well-aerated and clear. There is no evidence of focal opacification, pleural effusion or pneumothorax. The heart is  mildly enlarged. A pacemaker is noted at the left chest wall, with leads ending at the right atrium and right ventricle. No acute osseous abnormalities are seen. IMPRESSION: Mild cardiomegaly. Lungs remain grossly clear. Electronically Signed   By: Garald Balding M.D.   On: 02/10/2019 17:43   Dg Abd 1 View  Result Date: 02/13/2019 CLINICAL DATA:  Constipation EXAM: ABDOMEN - 1 VIEW COMPARISON:  CT abdomen/pelvis dated 02/10/2019 FINDINGS: Nonobstructive bowel gas pattern. Mild to moderate left colonic stool burden. Mild degenerative changes of the lumbar spine. IMPRESSION: Mild to moderate left colonic stool burden. Electronically Signed   By: Julian Hy M.D.   On: 02/13/2019 08:14   Mr Lumbar Spine Wo Contrast  Result Date: 01/22/2019 CLINICAL DATA:  History of discitis/osteomyelitis at L4-5. EXAM: MRI LUMBAR SPINE WITHOUT CONTRAST TECHNIQUE: Multiplanar, multisequence MR imaging of the lumbar spine was performed. No intravenous contrast was administered. COMPARISON:  MRI lumbar spine since 05/13/2018 and 08/31/2018 FINDINGS: Examination is somewhat degraded by motion artifact, particularly the axial images. Segmentation: There are five lumbar type vertebral bodies. The last full intervertebral disc space is labeled L5-S1. This correlates with the prior MRIs. Alignment:  Normal Vertebrae: Resolution of the diskitis and osteomyelitis at L4-5. No residual marrow edema or paraspinal process. No findings for epidural abscess. Chronic endplate reactive changes at L4-5. Conus medullaris and cauda equina: Conus extends to the L1 level. Conus and cauda equina appear normal. Paraspinal and other soft tissues: No signal paraspinal findings. No psoas muscle abscess or myositis. Persistent extrarenal pelvis on the right side. No adenopathy or aortic aneurysm. Disc levels: T12-L1: Stable small left paracentral disc extrusion with minimal impression on the left side of thecal sac. L1-2: Diffuse annular bulge and  osteophytic ridging with mild bilateral lateral recess encroachment. There is also a persistent broad-based right foraminal, extraforaminal and far lateral disc protrusion. Possibly irritating the right L1 nerve root. L2-3: Diffuse bulging annulus with mild bilateral lateral recess stenosis, left greater than right. There is also mild left foraminal stenosis. L3-4: Diffuse bulging annulus and mild facet disease contributing to moderate bilateral lateral recess stenosis. Small focal extraforaminal disc protrusion on the right comes close to contacting the right L3 nerve root. This appears stable. L4-5: Collapsed disc space related to prior diskitis and osteomyelitis. No findings for residual infection or inflammation. Degenerated bulging annulus but no spinal or significant lateral recess stenosis. There is mild persistent foraminal and extraforaminal encroachment on the right L4 nerve root. Moderate facet disease. L5-S1: No significant findings. IMPRESSION: 1. Resolution of diskitis and osteomyelitis at L4-5. 2. Stable multilevel disc protrusions with lateral recess and foraminal stenosis as detailed above. No new or progressive findings. Electronically Signed   By: Marijo Sanes M.D.   On: 01/22/2019 11:32   Ct Chest High Resolution  Result Date: 02/15/2019 CLINICAL DATA:  Shortness of breath, lung nodule. EXAM: CT CHEST WITHOUT CONTRAST TECHNIQUE: Multidetector CT imaging of the chest was performed following the standard protocol without intravenous contrast. High resolution imaging of the lungs, as well as inspiratory and expiratory imaging, was performed. COMPARISON:  CT abdomen pelvis 02/10/2019. FINDINGS: Cardiovascular: Atherosclerotic calcification of the aorta aortic valve and coronary arteries. Heart is enlarged. Small to moderate pericardial effusion  is new from 02/10/2019. Pulmonic trunk is enlarged. Mediastinum/Nodes: Mediastinal lymph nodes measure up to 10 mm in right paratracheal station. Hilar  regions are difficult to evaluate without IV contrast. No axillary adenopathy. Esophagus is grossly unremarkable. Lungs/Pleura: Negative for subpleural reticulation, traction bronchiectasis/bronchiolectasis, ground-glass, architectural distortion. Mild cylindrical bronchiectasis in both lower lobes with small bilateral pleural effusions, left greater than right, largely new 02/10/2019. Associated compressive atelectasis in both lower lobes. Lingular nodule measures 1.6 cm (series 4, image 87), as on 02/10/2019. Airway is unremarkable. No air trapping. Upper Abdomen: Visualized portions of the liver, gallbladder and adrenal glands are unremarkable. Right hydronephrosis, partially imaged. Left renal stone. Visualized portions of the spleen, pancreas and stomach are grossly unremarkable. Upper abdominal lymph nodes are not enlarged by CT size criteria. Musculoskeletal: Degenerative changes in the spine. No worrisome lytic or sclerotic lesions. IMPRESSION: 1. No evidence of fibrotic interstitial lung disease. 2. Lingular nodule. Consider one of the following in 3 months for both low-risk and high-risk individuals: (a) repeat chest CT or follow-up PET-CT. This recommendation follows the consensus statement: Guidelines for Management of Incidental Pulmonary Nodules Detected on CT Images: From the Fleischner Society 2017; Radiology 2017; 284:228-243. 3. Small to moderate pericardial effusion, new from 02/10/2019. 4. Small bilateral pleural effusions, left greater than right, largely new 02/10/2019. 5. Partially imaged right hydronephrosis, better seen and described on 02/10/2019. 6. Left renal stone. 7. Borderline enlarged right paratracheal lymph node, likely reactive. 8. Aortic atherosclerosis (ICD10-170.0). Coronary artery calcification. 9. Enlarged pulmonic trunk, indicative of pulmonary arterial hypertension. Electronically Signed   By: Lorin Picket M.D.   On: 02/15/2019 08:10   Mr Knee Left Wo Contrast  Result  Date: 01/21/2019 CLINICAL DATA:  Fever of unknown origin. Severe knee pain. Negative left knee aspiration yesterday. EXAM: MRI OF THE LEFT KNEE WITHOUT CONTRAST TECHNIQUE: Multiplanar, multisequence MR imaging of the knee was performed. No intravenous contrast was administered. COMPARISON:  Left knee x-rays dated January 20, 2019. FINDINGS: MENISCI Medial meniscus: Longitudinal tear of the posterior horn. Radial tear of the body. Lateral meniscus:  Intact. LIGAMENTS Cruciates:  Intact ACL and PCL. Collaterals: Medial collateral ligament is intact. Lateral collateral ligament complex is intact. CARTILAGE Patellofemoral: Diffuse moderate to high-grade partial-thickness cartilage loss. Medial: Large areas of full-thickness cartilage loss over the weight-bearing medial femoral condyle and medial tibial plateau. Lateral: Partial-thickness cartilage loss along the mesial aspect of the lateral compartment. Joint: Small joint effusion. Normal Hoffa's fat. No plical thickening. Popliteal Fossa:  No Baker cyst. Intact popliteus tendon. Extensor Mechanism: Intact quadriceps tendon and patellar tendon. Intact medial and lateral patellar retinaculum. Intact MPFL. Bones: No acute fracture or dislocation. No focal bone lesion. Tricompartmental osteophytes. Degenerative subchondral marrow edema in the peripheral medial femoral condyle and medial tibial plateau. Other: Mild soft tissue swelling about the knee. No fluid collection. No muscle edema. IMPRESSION: 1. No discrete evidence of soft tissue infection about the knee. 2. Longitudinal tear of the medial meniscus posterior horn. Radial tear of the medial meniscus body. 3. Tricompartmental osteoarthritis, moderate in the medial and patellofemoral compartments. Electronically Signed   By: Titus Dubin M.D.   On: 01/21/2019 15:23   Dg Knee Complete 4 Views Left  Result Date: 01/20/2019 CLINICAL DATA:  Severe LEFT knee pain for awhile, fluid drained from LEFT knee this  morning, persistent pain especially with any movement, no known injury EXAM: LEFT KNEE - COMPLETE 4+ VIEW COMPARISON:  None FINDINGS: Osseous demineralization. Joint space narrowing and spur formation greatest at  medial compartment. No acute fracture, dislocation, or bone destruction. Scattered soft tissue swelling. Small metallic foreign body at the medial soft tissues of the distal thigh. No significant joint effusion. IMPRESSION: Osteoarthritic changes and osseous demineralization of the LEFT knee. No acute abnormalities. Electronically Signed   By: Lavonia Dana M.D.   On: 01/20/2019 16:15   Ct Renal Stone Study  Result Date: 02/10/2019 CLINICAL DATA:  Bilateral leg swelling since coming off of Lasix. Erythema of the left leg also noted. EXAM: CT ABDOMEN AND PELVIS WITHOUT CONTRAST TECHNIQUE: Multidetector CT imaging of the abdomen and pelvis was performed following the standard protocol without IV contrast. COMPARISON:  01/19/2019 FINDINGS: Lower chest: Noncalcified masslike opacity in the lingula is identified measuring 16 x 14 mm, series 7/. Bibasilar dependent atelectasis is identified. Possible additional tiny nodule in posterior right lower lobe measuring 3 mm. Left main and three-vessel coronary arteriosclerosis. Right-sided pacer leads are present. No significant pericardial effusion or thickening. Small hiatal hernia. Hepatobiliary: The unenhanced liver is unremarkable without apparent mass or biliary dilatation given limitations of a noncontrast study. Gallbladder contains layering sludge and/or calculi. No secondary signs of acute cholecystitis. A calcification is noted in the duodenum possibly representing a distal common bowel duct stone measuring 6 mm. Correlate with liver function tests. If necessary, MRCP or ERCP may help further assessment, series 3/39. Pancreas: Atrophic without inflammation, ductal dilatation or mass. Spleen: No splenomegaly Adrenals/Urinary Tract: Normal bilateral adrenal  glands. A 5 mm nonobstructing left upper pole renal calculus is identified. At least 4-5 renal stones are noted in the interpolar aspect of the right kidney, the largest measuring up to approximately 4 mm with moderate to marked dilatation of the right renal collecting system. Right-sided urothelial thickening is noted the level of the urinary bladder. The bladder is also thickened in appearance some which is due to underdistention but cystitis is also possibility. No definite ureteral stones are identified. Stomach/Bowel: Small hiatal hernia. Physiologic distention of the stomach with normal small bowel rotation. No small bowel obstruction are inflammation. Moderate to marked stool retention throughout the colon consistent with constipation. Appendix appears normal. Vascular/Lymphatic: Aortic atherosclerosis. No enlarged abdominal or pelvic lymph nodes. Reproductive: Uterus and bilateral adnexa are unremarkable. Other: Presacral edema.  No free air nor free fluid. Musculoskeletal: Lumbar spondylosis most marked at L4-5 with discogenic sclerosis. IMPRESSION: 1. **An incidental finding of potential clinical significance has been found. Lingular masslike abnormality measuring 16 x 14 mm, partially included on this study. Dedicated chest CT is recommended. Findings raise concern for pulmonary neoplasm. Additional tiny 3 mm nodule in the subpleural right lower lobe is indeterminate for neoplasm versus post inflammatory/infectious change.** 2. Bilateral renal calculi are identified without obstructive uropathy. Moderate-to-marked chronic dilatation of the right renal collecting system with diffuse urothelial and bladder wall thickening raise concern for stigmata urinary tract infection. Correlate. 3. Stool is noted throughout the colon consistent with constipation. 4. 6 mm calcification in the region of the second portion of the duodenum may represent choledocholithiasis. Correlate with liver function tests. Additional  layering gallstones and calculi without secondary signs of cholecystitis are noted within the gallbladder. Electronically Signed   By: Ashley Royalty M.D.   On: 02/10/2019 20:54    Lab Data:  CBC: Recent Labs  Lab 02/12/19 0751 02/13/19 0535 02/13/19 0942 02/14/19 0256 02/15/19 0242 02/16/19 0814  WBC 7.2 6.5  --  6.2 6.6 7.7  HGB 8.6* 7.7* 7.8* 7.8* 7.8* 8.2*  HCT 25.8* 23.2* 23.7* 24.5* 24.5* 24.8*  MCV 87.8 87.2  --  88.1 88.1 88.9  PLT 209 182  --  211 247 631   Basic Metabolic Panel: Recent Labs  Lab 02/13/19 0535 02/14/19 0256 02/15/19 0242 02/16/19 0814 02/17/19 0508  NA 132* 133* 134* 133* 130*  K 3.3* 3.4* 3.9 3.3* 3.8  CL 99 98 101 97* 91*  CO2 19* 21* 20* 22 23  GLUCOSE 107* 110* 89 109* 145*  BUN 61* 59* 56* 53* 56*  CREATININE 3.58* 3.58* 3.66* 3.54* 3.42*  CALCIUM 9.5 9.4 9.5 9.4 9.7  PHOS 3.4 2.8 2.8 3.7 3.4   GFR: Estimated Creatinine Clearance: 14.9 mL/min (A) (by C-G formula based on SCr of 3.42 mg/dL (H)). Liver Function Tests: Recent Labs  Lab 02/13/19 0535 02/14/19 0256 02/15/19 0242 02/16/19 0814 02/17/19 0508  AST 10* 10* 12* 12* 11*  ALT _0 ALKPHOS 79 83 84 84 80  BILITOT 0.6 0.4 0.5 0.8 0.9  PROT 5.5* 5.9* 6.3* 6.0* 6.8  ALBUMIN 2.5*  2.5* 2.6*  2.6* 2.5*  2.5* 2.4*  2.4* 3.3*  3.3*   No results for input(s): LIPASE, AMYLASE in the last 168 hours. No results for input(s): AMMONIA in the last 168 hours. Coagulation Profile: No results for input(s): INR, PROTIME in the last 168 hours. Cardiac Enzymes: No results for input(s): CKTOTAL, CKMB, CKMBINDEX, TROPONINI in the last 168 hours. BNP (last 3 results) No results for input(s): PROBNP in the last 8760 hours. HbA1C: No results for input(s): HGBA1C in the last 72 hours. CBG: Recent Labs  Lab 02/16/19 1108 02/16/19 1624 02/16/19 2057 02/17/19 0649 02/17/19 1121  GLUCAP 119* 243* 158* 129* 172*   Lipid Profile: No results for input(s): CHOL, HDL, LDLCALC, TRIG,  CHOLHDL, LDLDIRECT in the last 72 hours. Thyroid Function Tests: No results for input(s): TSH, T4TOTAL, FREET4, T3FREE, THYROIDAB in the last 72 hours. Anemia Panel: No results for input(s): VITAMINB12, FOLATE, FERRITIN, TIBC, IRON, RETICCTPCT in the last 72 hours. Urine analysis:    Component Value Date/Time   COLORURINE YELLOW 02/10/2019 1726   APPEARANCEUR TURBID (A) 02/10/2019 1726   LABSPEC 1.008 02/10/2019 1726   PHURINE 5.0 02/10/2019 1726   GLUCOSEU NEGATIVE 02/10/2019 1726   HGBUR SMALL (A) 02/10/2019 1726   BILIRUBINUR NEGATIVE 02/10/2019 1726   KETONESUR NEGATIVE 02/10/2019 1726   PROTEINUR 30 (A) 02/10/2019 1726   NITRITE NEGATIVE 02/10/2019 1726   LEUKOCYTESUR LARGE (A) 02/10/2019 1726     Bonnell Public M.D. [facility] 02/17/2019, 3:06 PM  Pager: [phone]  After 7pm go to [email or web address] - password [email or web address]  Call night coverage person covering after 7pm

## 2019-02-17 NOTE — Progress Notes (Signed)
Subjective:  3000 UOP again - crt stable but poor last 72 hours.  She is better   Objective Vital signs in last 24 hours: Vitals:   02/16/19 1627 02/16/19 2058 02/17/19 0401 02/17/19 0919  BP: 133/82 (!) 154/77 (!) 149/80 (!) 157/66  Pulse: 79 81 75 79  Resp: 18 18 18 20   Temp: 97.9 F (36.6 C) (!) 97.5 F (36.4 C) 98.1 F (36.7 C) 98.1 F (36.7 C)  TempSrc: Oral Oral Oral Oral  SpO2: 97% 98% 99% 98%  Weight:      Height:       Weight change:   Intake/Output Summary (Last 24 hours) at 02/17/2019 1140 Last data filed at 02/17/2019 1122 Gross per 24 hour  Intake 1190 ml  Output 3950 ml  Net -2760 ml    Assessment/ Plan: Pt is a 73 y.o. yo female with urologic history of having lithotripsy /ureteral stands for urolithiasis.  During that hosp had AKI - crt peaked at 6, then improved to 2.54 who was admitted on 02/10/2019 with volume overload and A on CRF- up over 4  Assessment/Plan: 1. Renal- baseline CKD- crt between 2.5 and 3 really since late 2019.  AKI in Feb- now with recurrent AKI- crt peaked at 4.2- improved but now stalled at 3.4- 3.6 over the last 4 days.  It is unclear if there is really active obstruction happening , I think not- but now with UTI vs chronic cystitis- could be contributing - now on amp and meropenam.  No indication for dialysis and I am really NOT excited to introduce dialysis into this situation  2. HTN/vol- making good urine weight finally is decreasing- on IV lasix 80 BID now - she says much better.  I think could transition to PO lasix- will do it 3. Anemia- hgb in the 7's - on nulecit- have added ESA  4. Chest CT- confirmed lingular mass- for repeat imaging in 3 mos, this did not make her happy  5. Bilat knee pain- difficult to examine as she is tender- not red-  I think just due to being in bed for so long,  6. Hypokalemia- s/p supp yest- 3.8 today 7. Dispo - not sure what end point is ?  Kidney function pretty stable albeit poor and slightly worse than  her baseline.  I will leave to hosp when to change lasix to PO and I will make sure she has follow up with Dr. Johnney Ou and sign off, call with questions       Louis Meckel    Labs: Basic Metabolic Panel: Recent Labs  Lab 02/15/19 0242 02/16/19 0814 02/17/19 0508  NA 134* 133* 130*  K 3.9 3.3* 3.8  CL 101 97* 91*  CO2 20* 22 23  GLUCOSE 89 109* 145*  BUN 56* 53* 56*  CREATININE 3.66* 3.54* 3.42*  CALCIUM 9.5 9.4 9.7  PHOS 2.8 3.7 3.4   Liver Function Tests: Recent Labs  Lab 02/15/19 0242 02/16/19 0814 02/17/19 0508  AST 12* 12* 11*  ALT 7 9 7   ALKPHOS 84 84 80  BILITOT 0.5 0.8 0.9  PROT 6.3* 6.0* 6.8  ALBUMIN 2.5*  2.5* 2.4*  2.4* 3.3*  3.3*   No results for input(s): LIPASE, AMYLASE in the last 168 hours. No results for input(s): AMMONIA in the last 168 hours. CBC: Recent Labs  Lab 02/10/19 1205  02/12/19 0751 02/13/19 0535  02/14/19 0256 02/15/19 0242 02/16/19 0814  WBC 5.1   < > 7.2 6.5  --  6.2 6.6 7.7  NEUTROABS 3.0  --   --   --   --   --   --   --   HGB 7.9*   < > 8.6* 7.7*   < > 7.8* 7.8* 8.2*  HCT 24.8*   < > 25.8* 23.2*   < > 24.5* 24.5* 24.8*  MCV 90.8   < > 87.8 87.2  --  88.1 88.1 88.9  PLT 218   < > 209 182  --  211 247 240   < > = values in this interval not displayed.   Cardiac Enzymes: No results for input(s): CKTOTAL, CKMB, CKMBINDEX, TROPONINI in the last 168 hours. CBG: Recent Labs  Lab 02/16/19 1108 02/16/19 1624 02/16/19 2057 02/17/19 0649 02/17/19 1121  GLUCAP 119* 243* 158* 129* 172*    Iron Studies: No results for input(s): IRON, TIBC, TRANSFERRIN, FERRITIN in the last 72 hours. Studies/Results: No results found. Medications: Infusions: . sodium chloride 500 mL (02/15/19 2238)  . albumin human 25 g (02/17/19 1105)  . ferric gluconate (FERRLECIT/NULECIT) IV 125 mg (02/17/19 0936)    Scheduled Medications: . allopurinol  50 mg Oral Daily  . colchicine  0.6 mg Oral Daily  . darbepoetin (ARANESP)  injection - NON-DIALYSIS  200 mcg Subcutaneous Q Mon-1800  . docusate sodium  100 mg Oral BID  . fesoterodine  4 mg Oral Daily  . folic acid  1 mg Oral Daily  . furosemide  80 mg Intravenous BID  . Gerhardt's butt cream   Topical QID  . heparin  5,000 Units Subcutaneous Q8H  . insulin aspart  0-9 Units Subcutaneous TID WC  . levofloxacin  250 mg Oral Q48H  . levothyroxine  75 mcg Oral Q0600  . mirabegron ER  50 mg Oral Daily  . pantoprazole  40 mg Oral BID  . polyethylene glycol  17 g Oral Daily  . potassium chloride  20 mEq Oral BID  . predniSONE  40 mg Oral QAC breakfast  . senna-docusate  1 tablet Oral BID  . simvastatin  40 mg Oral QHS  . sodium bicarbonate  1,300 mg Oral BID  . sodium chloride flush  3 mL Intravenous Once  . tamsulosin  0.4 mg Oral Daily  . vitamin B-12  1,000 mcg Oral Daily    have reviewed scheduled and prn medications.  Physical Exam: General: alert, seems better Heart:RRR Lungs: mostly clear Abdomen: soft, non tender Extremities: min edema- tender- she says knees better      02/17/2019,11:40 AM  LOS: 7 days

## 2019-02-18 LAB — GLUCOSE, CAPILLARY
GLUCOSE-CAPILLARY: 203 mg/dL — AB (ref 70–99)
Glucose-Capillary: 124 mg/dL — ABNORMAL HIGH (ref 70–99)
Glucose-Capillary: 160 mg/dL — ABNORMAL HIGH (ref 70–99)
Glucose-Capillary: 206 mg/dL — ABNORMAL HIGH (ref 70–99)

## 2019-02-18 LAB — RENAL FUNCTION PANEL
Albumin: 4.1 g/dL (ref 3.5–5.0)
Anion gap: 16 — ABNORMAL HIGH (ref 5–15)
BUN: 61 mg/dL — ABNORMAL HIGH (ref 8–23)
CO2: 25 mmol/L (ref 22–32)
Calcium: 10.3 mg/dL (ref 8.9–10.3)
Chloride: 88 mmol/L — ABNORMAL LOW (ref 98–111)
Creatinine, Ser: 3.16 mg/dL — ABNORMAL HIGH (ref 0.44–1.00)
GFR calc Af Amer: 16 mL/min — ABNORMAL LOW (ref >60)
GFR calc non Af Amer: 14 mL/min — ABNORMAL LOW (ref >60)
Glucose, Bld: 126 mg/dL — ABNORMAL HIGH (ref 70–99)
POTASSIUM: 4.1 mmol/L (ref 3.5–5.1)
Phosphorus: 3 mg/dL (ref 2.5–4.6)
Sodium: 129 mmol/L — ABNORMAL LOW (ref 135–145)

## 2019-02-18 LAB — HEPATIC FUNCTION PANEL
ALT: 7 U/L (ref 0–44)
AST: 16 U/L (ref 15–41)
Albumin: 4.1 g/dL (ref 3.5–5.0)
Alkaline Phosphatase: 69 U/L (ref 38–126)
BILIRUBIN DIRECT: 0.2 mg/dL (ref 0.0–0.2)
Indirect Bilirubin: 0.2 mg/dL — ABNORMAL LOW (ref 0.3–0.9)
Total Bilirubin: 0.4 mg/dL (ref 0.3–1.2)
Total Protein: 6.7 g/dL (ref 6.5–8.1)

## 2019-02-18 NOTE — Progress Notes (Signed)
PROGRESS NOTE    Erika Fry  DZH:[id number] DOB: 06-03-1946 DOA: 02/10/2019 PCP: [name], MD  Brief Narrative: 73 year old female with past medical history significant for CKD stage IV, diabetes mellitus, obesity, hypertension, hyperlipidemia, nephrolithiasis, chronic right-sided hydronephrosis, gout and arthritis. Patient was admitted with worsening leg edema, volume overload, worsening renal function, bilateral lower leg cellulitis and dysuria. Work-up has revealed UTI secondary to Enterobacter cloacae and faecalis. CT scan renal stone done revealed lingular mass/nodule.  02/17/2019: Echocardiogram done revealed diastolic dysfunction, with normal EF.  Leg edema is improving.  Good urine output.  Patient continues to improve.  Serum creatinine is on the downward trend.  We will continue diuretics, and continue to monitor renal function.  Consider changing diuretics to oral torsemide on discharge if okay with nephrology (considering difference in pharmacokinetics).  Will consult physical therapy to assess patient and advised him appropriate disposition.  Knee pain has improved.  Overall, patient looks much better, but long-term prognosis remains guarded.  Low threshold to consider palliative care follow-up on discharge.  Assessment & Plan:   Principal Problem:   ARF (acute renal failure) (HCC) Active Problems:   CKD (chronic kidney disease), stage IV (HCC)   Diet-controlled diabetes mellitus (HCC)   Hypothyroidism   Essential hypertension  Acute on chronic kidney disease stage IV with anasarca, bilateral pedal edema, acute on chronic diastolic CHF: -Continue IV diuretics for now.   -Continue to monitor urine output, volume status and serum creatinine.   -Serum creatinine is on the downward trend for now, despite being on diuretics, component of cardiorenal syndrome remains a possibility.   -Consider changing diuretics to oral torsemide on discharge if okay with nephrology.    -Nephrology input is highly appreciated.    Active problems Hydronephrosis -CT renal stone study on 3/11 showed moderate to marked chronic dilatation of the right renal collecting system, bilateral renal calculi, no obstructive uropathy. -Follow-up with urology on discharge.  Patient also has chronic dysuria.    Bilateral lower extremity cellulitis -Patient was initially placed on IV ceftriaxone, then changed to IV ampicillin and meropenem on 3/15 -Urine culture showed Enterobacter and enterococcus, discussed with pharmacy, narrow antibiotics to Levaquin for 4 more days. -02/17/2019: Cellulitis has improved significantly.  Enterobacter and enterococcus UTI with history of interstitial cystitis -Patient was placed on IV ampicillin and meropenem on 02/14/2019, discussed with pharmacy, narrow antibiotics down to Levaquin for 4 more days to complete 7 days of therapy. -Follows urology, Dr. Louis Meckel  Diabetes mellitus type 2, uncontrolled with renal complications, CKD -Continue sliding scale insulin  Acute on chronic anemia/anemia of chronic disease, from CKD/iron deficiency anemia -Monitor H&H, currently stable,  Obesity: Body mass index is 31.95 kg/m.  Pressure ulcer: Bilateral buttocks. Left thigh.  Constipation. Bowel regimen as well as enema.  Acute gout arthritis, right worse than left knee: -ESR, CRP, uric acid elevated -Started on colchicine and prednisone.  No NSAIDs due to CKD  Code Status: full  DVT Prophylaxis: Heparin subcu Family Communication:  No family available  Disposition Plan: Pending clinical improvement and improvement of sodium as well as physical therapy consult.  Consultants:   Nephrology    Pressure Injury 01/17/19 Stage I -  Intact skin with non-blanchable redness of a localized area usually over a bony prominence. (Active)  01/17/19 0230  Location: Buttocks  Location Orientation:   Staging: Stage I -  Intact skin with  non-blanchable redness of a localized area usually over a bony prominence.  Wound Description (Comments):  Present on Admission:      Pressure Injury 02/11/19 Stage II -  Partial thickness loss of dermis presenting as a shallow open ulcer with a red, pink wound bed without slough. LEFT inner thigh-looks like a healed wound and has opened from moisture (a gash) (Active)  02/11/19 1600  Location: Thigh  Location Orientation: Anterior;Left  Staging: Stage II -  Partial thickness loss of dermis presenting as a shallow open ulcer with a red, pink wound bed without slough.  Wound Description (Comments): LEFT inner thigh-looks like a healed wound and has opened from moisture (a gash)  Present on Admission: Yes    Estimated body mass index is 28.73 kg/m as calculated from the following:   Height as of this encounter: 5' 5"  (1.651 m).   Weight as of this encounter: 78.3 kg.   Antimicrobials: Levofloxacin  Subjective: Patient resting in bed complains of bilateral knee pain right more than left has gout if she was started on prednisone and colchicine it is little better but still unable to walk  Objective: Vitals:   02/17/19 1640 02/17/19 2128 02/18/19 0501 02/18/19 0823  BP: (!) 156/67 (!) 142/73 (!) 151/82 (!) 147/71  Pulse: 77 75 67 67  Resp: 19 20 16 18   Temp: 98 F (36.7 C) 98.3 F (36.8 C) 98.2 F (36.8 C) 98.1 F (36.7 C)  TempSrc: Oral Oral Oral Oral  SpO2: 97% 97% 98% 98%  Weight:  78.3 kg    Height:        Intake/Output Summary (Last 24 hours) at 02/18/2019 0940 Last data filed at 02/18/2019 3154 Gross per 24 hour  Intake 600 ml  Output 3975 ml  Net -3375 ml   Filed Weights   02/14/19 2136 02/15/19 2043 02/17/19 2128  Weight: 87.3 kg 78.2 kg 78.3 kg    Examination:  General exam: Appears calm and comfortable  Respiratory system: Clear to auscultation. Respiratory effort normal. Cardiovascular system: S1 & S2 heard, RRR. No JVD, murmurs, rubs, gallops or clicks.  No pedal edema. Gastrointestinal system: Abdomen is nondistended, soft and nontender. No organomegaly or masses felt. Normal bowel sounds heard. Central nervous system: Alert and oriented. No focal neurological deficits. Extremities 1+ bilateral edema Skin: No rashes, lesions or ulcers Psychiatry: Judgement and insight appear normal. Mood & affect appropriate.     Data Reviewed: I have personally reviewed following labs and imaging studies  CBC: Recent Labs  Lab 02/12/19 0751 02/13/19 0535 02/13/19 0942 02/14/19 0256 02/15/19 0242 02/16/19 0814  WBC 7.2 6.5  --  6.2 6.6 7.7  HGB 8.6* 7.7* 7.8* 7.8* 7.8* 8.2*  HCT 25.8* 23.2* 23.7* 24.5* 24.5* 24.8*  MCV 87.8 87.2  --  88.1 88.1 88.9  PLT 209 182  --  211 247 008   Basic Metabolic Panel: Recent Labs  Lab 02/14/19 0256 02/15/19 0242 02/16/19 0814 02/17/19 0508 02/18/19 0452  NA 133* 134* 133* 130* 129*  K 3.4* 3.9 3.3* 3.8 4.1  CL 98 101 97* 91* 88*  CO2 21* 20* 22 23 25   GLUCOSE 110* 89 109* 145* 126*  BUN 59* 56* 53* 56* 61*  CREATININE 3.58* 3.66* 3.54* 3.42* 3.16*  CALCIUM 9.4 9.5 9.4 9.7 10.3  PHOS 2.8 2.8 3.7 3.4 3.0   GFR: Estimated Creatinine Clearance: 16.2 mL/min (A) (by C-G formula based on SCr of 3.16 mg/dL (H)). Liver Function Tests: Recent Labs  Lab 02/14/19 0256 02/15/19 0242 02/16/19 0814 02/17/19 0508 02/18/19 0452  AST 10* 12* 12* 11* 16  ALT 6 7 9 7 7   ALKPHOS 83 84 84 80 69  BILITOT 0.4 0.5 0.8 0.9 0.4  PROT 5.9* 6.3* 6.0* 6.8 6.7  ALBUMIN 2.6*  2.6* 2.5*  2.5* 2.4*  2.4* 3.3*  3.3* 4.1  4.1   No results for input(s): LIPASE, AMYLASE in the last 168 hours. No results for input(s): AMMONIA in the last 168 hours. Coagulation Profile: No results for input(s): INR, PROTIME in the last 168 hours. Cardiac Enzymes: No results for input(s): CKTOTAL, CKMB, CKMBINDEX, TROPONINI in the last 168 hours. BNP (last 3 results) No results for input(s): PROBNP in the last 8760  hours. HbA1C: No results for input(s): HGBA1C in the last 72 hours. CBG: Recent Labs  Lab 02/17/19 0649 02/17/19 1121 02/17/19 1637 02/17/19 2128 02/18/19 0658  GLUCAP 129* 172* 169* 173* 124*   Lipid Profile: No results for input(s): CHOL, HDL, LDLCALC, TRIG, CHOLHDL, LDLDIRECT in the last 72 hours. Thyroid Function Tests: No results for input(s): TSH, T4TOTAL, FREET4, T3FREE, THYROIDAB in the last 72 hours. Anemia Panel: No results for input(s): VITAMINB12, FOLATE, FERRITIN, TIBC, IRON, RETICCTPCT in the last 72 hours. Sepsis Labs: No results for input(s): PROCALCITON, LATICACIDVEN in the last 168 hours.  Recent Results (from the past 240 hour(s))  Culture, Urine     Status: Abnormal   Collection Time: 02/11/19 12:25 AM  Result Value Ref Range Status   Specimen Description URINE, CLEAN CATCH  Final   Special Requests   Final    NONE Performed at [facility], [address] [place], [place] [address]    Culture (A)  Final    50,000 COLONIES/mL ENTEROBACTER CLOACAE 20,000 COLONIES/mL ENTEROCOCCUS FAECALIS    Report Status 02/14/2019 FINAL  Final   Organism ID, Bacteria ENTEROBACTER CLOACAE (A)  Final   Organism ID, Bacteria ENTEROCOCCUS FAECALIS (A)  Final      Susceptibility   Enterobacter cloacae - MIC*    CEFAZOLIN >=64 RESISTANT Resistant     CEFTRIAXONE >=64 RESISTANT Resistant     CIPROFLOXACIN <=0.25 SENSITIVE Sensitive     GENTAMICIN <=1 SENSITIVE Sensitive     IMIPENEM 1 SENSITIVE Sensitive     NITROFURANTOIN 128 RESISTANT Resistant     TRIMETH/SULFA <=20 SENSITIVE Sensitive     PIP/TAZO >=128 RESISTANT Resistant     * 50,000 COLONIES/mL ENTEROBACTER CLOACAE   Enterococcus faecalis - MIC*    AMPICILLIN <=2 SENSITIVE Sensitive     LEVOFLOXACIN 1 SENSITIVE Sensitive     NITROFURANTOIN <=16 SENSITIVE Sensitive     VANCOMYCIN 1 SENSITIVE Sensitive     * 20,000 COLONIES/mL ENTEROCOCCUS FAECALIS  Culture, blood (routine x 2)     Status: None    Collection Time: 02/11/19  3:10 PM  Result Value Ref Range Status   Specimen Description BLOOD RIGHT ANTECUBITAL  Final   Special Requests   Final    BOTTLES DRAWN AEROBIC ONLY Blood Culture adequate volume   Culture   Final    NO GROWTH 5 DAYS Performed at [facility], [place] [address], [place],  [address]    Report Status 02/16/2019 FINAL  Final  Culture, blood (routine x 2)     Status: None   Collection Time: 02/11/19  3:15 PM  Result Value Ref Range Status   Specimen Description BLOOD RIGHT HAND  Final   Special Requests   Final    BOTTLES DRAWN AEROBIC ONLY Blood Culture adequate volume   Culture   Final  NO GROWTH 5 DAYS Performed at [facility], [place] [address] [place], [place] [address]    Report Status 02/16/2019 FINAL  Final         Radiology Studies: No results found.      Scheduled Meds: . allopurinol  50 mg Oral Daily  . colchicine  0.6 mg Oral Daily  . darbepoetin (ARANESP) injection - NON-DIALYSIS  200 mcg Subcutaneous Q Mon-1800  . docusate sodium  100 mg Oral BID  . fesoterodine  4 mg Oral Daily  . folic acid  1 mg Oral Daily  . furosemide  80 mg Intravenous BID  . Gerhardt's butt cream   Topical QID  . heparin  5,000 Units Subcutaneous Q8H  . insulin aspart  0-9 Units Subcutaneous TID WC  . levofloxacin  250 mg Oral Q48H  . levothyroxine  75 mcg Oral Q0600  . mirabegron ER  50 mg Oral Daily  . pantoprazole  40 mg Oral BID  . polyethylene glycol  17 g Oral Daily  . potassium chloride  20 mEq Oral BID  . predniSONE  40 mg Oral QAC breakfast  . senna-docusate  1 tablet Oral BID  . simvastatin  40 mg Oral QHS  . sodium bicarbonate  1,300 mg Oral BID  . sodium chloride flush  3 mL Intravenous Once  . tamsulosin  0.4 mg Oral Daily  . vitamin B-12  1,000 mcg Oral Daily   Continuous Infusions: . sodium chloride 500 mL (02/15/19 2238)  . ferric gluconate (FERRLECIT/NULECIT) IV 125 mg (02/17/19 0936)     LOS: 8 days      Georgette Shell, MD [facility] If 7PM-7AM, please contact night-coverage [email or web address] Password [facility] 02/18/2019, 9:40 AM

## 2019-02-18 NOTE — Evaluation (Signed)
Physical Therapy Evaluation Patient Details Name: INES REBEL MRN: [id number] DOB: [birth year] Today's Date: 02/18/2019   History of Present Illness  73 year old female with past medical history significant for CKD stage IV, diabetes mellitus, obesity, hypertension, hyperlipidemia, nephrolithiasis, chronic right-sided hydronephrosis, gout and arthritis.  Patient was admitted with worsening leg edema, volume overload, worsening renal function, bilateral lower leg cellulitis and dysuria.  Work-up has revealed UTI secondary to Enterobacter cloacae and faecalis.  CT scan renal stone done revealed lingular mass/nodule.  Clinical Impression   Patient received in bed, very pleasant and willing to participate with PT but very fatigued as well. Able to complete functional bed mobility with min guard, functional transfers with min guard and RW, and gait in room approximately 89f total (511f+ 1529fwith RW initially with min guard fading to S. Very fatigued at EOS but able to have productive BM and maintained balance while cleaning self with no UE support in front of BSC. She was left in bed with all needs met, bed alarm active this afternoon. Recommend HHPT especially as family lives nearby and will be able to provide immediate and direct assistance if needed.     Follow Up Recommendations Home health PT    Equipment Recommendations  None recommended by PT(has all appropriate DME )    Recommendations for Other Services       Precautions / Restrictions Precautions Precautions: Fall Precaution Comments: Poor safety awareness at times Restrictions Weight Bearing Restrictions: No      Mobility  Bed Mobility Overal bed mobility: Needs Assistance Bed Mobility: Supine to Sit;Sit to Supine     Supine to sit: Min guard Sit to supine: Min guard   General bed mobility comments: Min guard, extended time and increased effort   Transfers Overall transfer level: Needs assistance Equipment  used: Rolling walker (2 wheeled) Transfers: Sit to/from Stand Sit to Stand: Min guard         General transfer comment: min guard for functional transfers with RW, VC for safety and sequencing   Ambulation/Gait Ambulation/Gait assistance: Supervision Gait Distance (Feet): 20 Feet(5+15 in room ) Assistive device: Rolling walker (2 wheeled) Gait Pattern/deviations: Step-through pattern;Decreased step length - right;Decreased step length - left;Trunk flexed;Narrow base of support Gait velocity: Decreased   General Gait Details: initial min guard fading to S with RW, mobility improving with ongoing practice but very easily fatigued   StaScience writer Modified Rankin (Stroke Patients Only)       Balance Overall balance assessment: Needs assistance Sitting-balance support: Bilateral upper extremity supported;Feet supported Sitting balance-Leahy Scale: Good     Standing balance support: Bilateral upper extremity supported;During functional activity Standing balance-Leahy Scale: Fair Standing balance comment: heavy reliance on B UE support during gait, but able to maintain balance with no UEs/min guard to clean self in front of BSC                              Pertinent Vitals/Pain Pain Assessment: No/denies pain Pain Score: 0-No pain Pain Intervention(s): Monitored during session    Home Living Family/patient expects to be discharged to:: Private residence Living Arrangements: Alone Available Help at Discharge: Family;Available 24 hours/day Type of Home: House Home Access: Stairs to enter Entrance Stairs-Rails: Left;None EntTechnical brewer Steps: 1 with no railing in front, back is 2 steps with L railing  Home Layout:  Two level;Able to live on main level with bedroom/bathroom Home Equipment: Gilford Rile - 2 wheels;Cane - single point;Wheelchair - [employer];Shower seat      Prior Function Level of Independence:  Needs assistance   Gait / Transfers Assistance Needed: independent at baseline  ADL's / Homemaking Assistance Needed: pt reports that she was independent with ADLs/sefcare and home mgt        Hand Dominance        Extremity/Trunk Assessment   Upper Extremity Assessment Upper Extremity Assessment: Defer to OT evaluation    Lower Extremity Assessment Lower Extremity Assessment: Generalized weakness    Cervical / Trunk Assessment Cervical / Trunk Assessment: Kyphotic  Communication   Communication: No difficulties  Cognition Arousal/Alertness: Awake/alert Behavior During Therapy: WFL for tasks assessed/performed Overall Cognitive Status: Within Functional Limits for tasks assessed                                        General Comments      Exercises     Assessment/Plan    PT Assessment Patient needs continued PT services  PT Problem List Decreased strength;Decreased balance;Pain;Decreased mobility;Decreased activity tolerance;Decreased coordination;Decreased knowledge of use of DME;Decreased safety awareness       PT Treatment Interventions DME instruction;Functional mobility training;Balance training;Patient/family education;Gait training;Therapeutic activities;Neuromuscular re-education;Stair training;Therapeutic exercise    PT Goals (Current goals can be found in the Care Plan section)  Acute Rehab PT Goals Patient Stated Goal: home PT Goal Formulation: With patient Time For Goal Achievement: 02/02/19 Potential to Achieve Goals: Good    Frequency Min 3X/week   Barriers to discharge        Co-evaluation               AM-PAC PT "6 Clicks" Mobility  Outcome Measure Help needed turning from your back to your side while in a flat bed without using bedrails?: A Little Help needed moving from lying on your back to sitting on the side of a flat bed without using bedrails?: A Little Help needed moving to and from a bed to a chair  (including a wheelchair)?: A Little Help needed standing up from a chair using your arms (e.g., wheelchair or bedside chair)?: A Little Help needed to walk in hospital room?: A Little Help needed climbing 3-5 steps with a railing? : A Little 6 Click Score: 18    End of Session Equipment Utilized During Treatment: Gait belt Activity Tolerance: Patient limited by fatigue Patient left: in bed;with bed alarm set;with call bell/phone within reach Nurse Communication: Mobility status PT Visit Diagnosis: Other abnormalities of gait and mobility (R26.89);Muscle weakness (generalized) (M62.81);Pain    Time: 1453-1516 PT Time Calculation (min) (ACUTE ONLY): 23 min   Charges:   PT Evaluation $PT Eval Moderate Complexity: 1 Mod PT Treatments $Gait Training: 8-22 mins        Deniece Ree PT, DPT, CBIS  Supplemental Physical Therapist [facility]    Pager [phone] Acute Rehab Office [phone]

## 2019-02-18 NOTE — Evaluation (Signed)
Occupational Therapy Evaluation Patient Details Name: Erika Fry MRN: [id number] DOB: [birth year] Today's Date: 02/18/2019    History of Present Illness 73 year old female with past medical history significant for CKD stage IV, diabetes mellitus, obesity, hypertension, hyperlipidemia, nephrolithiasis, chronic right-sided hydronephrosis, gout and arthritis.  Patient was admitted with worsening leg edema, volume overload, worsening renal function, bilateral lower leg cellulitis and dysuria.  Work-up has revealed UTI secondary to Enterobacter cloacae and faecalis.  CT scan renal stone done revealed lingular mass/nodule.   Clinical Impression   Pt admitted with the above diagnoses and presents with below problem list. Pt will benefit from continued acute OT to address the below listed deficits and maximize independence with basic ADLs prior to d/c home with family assisting. At baseline pt is independent with ADLs. Pt is currently min A for most ADLs, Decreased activity tolerance and generalized weakness impacting ADLs and functional transfers/mobility. Pt reports family can provide initial 24 hour assist (recommended pt have assist lined up at least for first week). Pt motivated to work with therapy and eager to regain independence and return home.     Follow Up Recommendations  Home health OT;Supervision/Assistance - 24 hour    Equipment Recommendations  None recommended by OT    Recommendations for Other Services PT consult     Precautions / Restrictions Precautions Precautions: Fall Restrictions Weight Bearing Restrictions: No      Mobility Bed Mobility Overal bed mobility: Needs Assistance Bed Mobility: Supine to Sit     Supine to sit: Min assist     General bed mobility comments: Assist to steady. Therapist pulling up on therapist arms.   Transfers Overall transfer level: Needs assistance Equipment used: Rolling walker (2 wheeled) Transfers: Sit to/from [employer] Sit to Stand: Min assist Stand pivot transfers: Min assist       General transfer comment: EOB>BSC>recliner. steadying assist during transitions    Balance Overall balance assessment: Needs assistance Sitting-balance support: Bilateral upper extremity supported;Feet supported Sitting balance-Leahy Scale: Fair     Standing balance support: Bilateral upper extremity supported;During functional activity Standing balance-Leahy Scale: Poor Standing balance comment: reliance on the AD for dynamic activity                           ADL either performed or assessed with clinical judgement   ADL Overall ADL's : Needs assistance/impaired Eating/Feeding: Set up;Sitting   Grooming: Set up;Sitting   Upper Body Bathing: Set up;Supervision/ safety   Lower Body Bathing: Minimal assistance;Sit to/from stand   Upper Body Dressing : Set up;Supervision/safety;Sitting   Lower Body Dressing: Sit to/from stand;Minimal assistance   Toilet Transfer: Minimal assistance;Stand-pivot;BSC;RW;Moderate assistance   Toileting- Clothing Manipulation and Hygiene: Moderate assistance;Sit to/from stand Toileting - Clothing Manipulation Details (indicate cue type and reason): Pt with BUE support of rw in standing. Therapist completed pericare tasks Tub/ Shower Transfer: Minimal assistance;Stand-pivot;3 in 1;Rolling walker   Functional mobility during ADLs: Minimal assistance;Rolling walker(pivotal steps) General ADL Comments: Pt completed bed mobility, SPT to BSC, pericare, then pivotal steps to recliner.     Vision Baseline Vision/History: Wears glasses Wears Glasses: At all times       Perception     Praxis      Pertinent Vitals/Pain Pain Assessment: Faces Faces Pain Scale: Hurts little more Pain Location: Pt reports burning during and after urination Pain Descriptors / Indicators: Burning Pain Intervention(s): Monitored during session;Ice applied;Limited activity  within patient's tolerance  Hand Dominance     Extremity/Trunk Assessment Upper Extremity Assessment Upper Extremity Assessment: Generalized weakness   Lower Extremity Assessment Lower Extremity Assessment: Defer to PT evaluation       Communication Communication Communication: No difficulties   Cognition Arousal/Alertness: Awake/alert Behavior During Therapy: WFL for tasks assessed/performed Overall Cognitive Status: Within Functional Limits for tasks assessed                                     General Comments       Exercises     Shoulder Instructions      Home Living Family/patient expects to be discharged to:: Private residence Living Arrangements: Alone Available Help at Discharge: Family;Available 24 hours/day Type of Home: House Home Access: Stairs to enter [employer] of Steps: 1 Entrance Stairs-Rails: None Home Layout: Two level;Able to live on main level with bedroom/bathroom Alternate Level Stairs-Number of Steps: Pt says she has a basement but does not use it.   Bathroom Shower/Tub: Occupational psychologist: Handicapped height     Home Equipment: Environmental consultant - 2 wheels;Cane - single point;Wheelchair - [employer];Shower seat          Prior Functioning/Environment Level of Independence: Needs assistance  Gait / Transfers Assistance Needed: independent at baseline ADL's / Homemaking Assistance Needed: pt reports that she was independent with ADLs/sefcare and home mgt            OT Problem List: Decreased strength;Decreased activity tolerance;Pain;Impaired balance (sitting and/or standing)      OT Treatment/Interventions: Self-care/ADL training;Therapeutic exercise;DME and/or AE instruction;Therapeutic activities;Patient/family education    OT Goals(Current goals can be found in the care plan section) Acute Rehab OT Goals Patient Stated Goal: home OT Goal Formulation: With patient Time For Goal  Achievement: 03/04/19 Potential to Achieve Goals: Good ADL Goals Pt Will Perform Grooming: with modified independence;standing Pt Will Perform Upper Body Bathing: with set-up;sitting Pt Will Perform Lower Body Bathing: with modified independence;sit to/from stand Pt Will Perform Upper Body Dressing: with set-up;sitting Pt Will Perform Lower Body Dressing: with modified independence;sit to/from stand Pt Will Transfer to Toilet: with supervision;ambulating Pt Will Perform Toileting - Clothing Manipulation and hygiene: with modified independence;sit to/from stand  OT Frequency: Min 2X/week   Barriers to D/C:    pt reports multiple family members that live locally. Says 24 hour assist could be arranged for first week.       Co-evaluation              AM-PAC OT "6 Clicks" Daily Activity     Outcome Measure Help from another person eating meals?: None Help from another person taking care of personal grooming?: A Little Help from another person toileting, which includes using toliet, bedpan, or urinal?: A Lot Help from another person bathing (including washing, rinsing, drying)?: A Lot Help from another person to put on and taking off regular upper body clothing?: A Little Help from another person to put on and taking off regular lower body clothing?: A Lot 6 Click Score: 16   End of Session Equipment Utilized During Treatment: Gait belt;Rolling walker  Activity Tolerance: Patient limited by fatigue Patient left: in chair;with call bell/phone within reach;with chair alarm set  OT Visit Diagnosis: Muscle weakness (generalized) (M62.81);Pain;Other abnormalities of gait and mobility (R26.89)                Time: 2707-8675 OT Time Calculation (min):  30 min Charges:  OT General Charges $OT Visit: 1 Visit OT Evaluation $OT Eval Low Complexity: 1 Low OT Treatments $Self Care/Home Management : 8-22 mins  Tyrone Schimke, OT Acute Rehabilitation Services Pager:  [phone] Office: [phone]   Hortencia Pilar 02/18/2019, 1:56 PM

## 2019-02-19 ENCOUNTER — Telehealth: Payer: Self-pay

## 2019-02-19 ENCOUNTER — Other Ambulatory Visit: Payer: Self-pay

## 2019-02-19 LAB — GLUCOSE, CAPILLARY
Glucose-Capillary: 125 mg/dL — ABNORMAL HIGH (ref 70–99)
Glucose-Capillary: 182 mg/dL — ABNORMAL HIGH (ref 70–99)

## 2019-02-19 MED ORDER — GERHARDT'S BUTT CREAM: 1.0000 "application " | TOPICAL_CREAM | Freq: Four times a day (QID) | CUTANEOUS | Status: DC

## 2019-02-19 MED ORDER — POTASSIUM CHLORIDE ER 10 MEQ PO TBCR: 10.0000 meq | EXTENDED_RELEASE_TABLET | Freq: Two times a day (BID) | ORAL | 1 refills | Status: DC

## 2019-02-19 MED ORDER — COLCHICINE 0.6 MG PO TABS: 0.6000 mg | ORAL_TABLET | Freq: Every day | ORAL | 0 refills | Status: DC

## 2019-02-19 MED ORDER — PREDNISONE 10 MG PO TABS: ORAL_TABLET | ORAL | 0 refills | Status: DC

## 2019-02-19 MED ORDER — SODIUM BICARBONATE 650 MG PO TABS: 1300.0000 mg | ORAL_TABLET | Freq: Two times a day (BID) | ORAL | 0 refills | Status: DC

## 2019-02-19 MED ORDER — FUROSEMIDE 40 MG PO TABS: 80.0000 mg | ORAL_TABLET | Freq: Two times a day (BID) | ORAL | 1 refills | Status: DC

## 2019-02-19 NOTE — Telephone Encounter (Signed)
Pt daughter called wanting to know what meds her mom is suppose to be on since she got out the ed but we havent seen her since 08/2018; She will reach out to pcp

## 2019-02-19 NOTE — Discharge Summary (Addendum)
Physician Discharge Summary  Erika Fry EPP:[id number] DOB: Feb 15, 1946 DOA: 02/10/2019  PCP: [name], MD  Admit date: 02/10/2019 Discharge date: 02/19/2019  Admitted From: Home Disposition: Home Recommendations for Outpatient Follow-up:  1. Follow up with PCP in 1-2 weeks 2. Please obtain BMP/CBC in one week 3. Please follow up with nephrology Dr. Johnney Ou  Home Health yes Equipment/Devices none Discharge Condition: Stable and improved CODE STATUS full code Diet recommendation: Cardiac Brief/Interim Summary:73 year old female with past medical history significant for CKD stage IV, diabetes mellitus, obesity, hypertension, hyperlipidemia, nephrolithiasis, chronic right-sided hydronephrosis, gout and arthritis. Patient was admitted with worsening leg edema, volume overload, worsening renal function, bilateral lower leg cellulitis and dysuria. Work-up has revealed UTI secondary to Enterobacter cloacae and faecalis. CT scan renal stone done revealed lingular mass/nodule.  02/17/2019: Echocardiogram done revealed diastolic dysfunction, with normal EF. Leg edema is improving. Good urine output. Patient continues to improve. Serum creatinine is on the downward trend. We will continue diuretics, and continue to monitor renal function. Consider changing diuretics to oral torsemide on discharge if okay with nephrology (considering difference in pharmacokinetics). Will consult physical therapy to assess patient and advised him appropriate disposition. Knee pain has improved. Overall, patient looks much better, but long-term prognosis remains guarded. Low threshold to consider palliative care follow-up on discharge Discharge Diagnoses:  Principal Problem:   ARF (acute renal failure) ([place]) Active Problems:   CKD (chronic kidney disease), stage IV ([facility])   Diet-controlled diabetes mellitus ([place])   Hypothyroidism   Essential hypertension   Pressure Injury 01/17/19 Stage I -   Intact skin with non-blanchable redness of a localized area usually over a bony prominence. (Active)  01/17/19 0230  Location: Buttocks  Location Orientation:   Staging: Stage I -  Intact skin with non-blanchable redness of a localized area usually over a bony prominence.  Wound Description (Comments):   Present on Admission:      Pressure Injury 02/11/19 Stage II -  Partial thickness loss of dermis presenting as a shallow open ulcer with a red, pink wound bed without slough. LEFT inner thigh-looks like a healed wound and has opened from moisture (a gash) (Active)  02/11/19 1600  Location: Thigh  Location Orientation: Anterior;Left  Staging: Stage II -  Partial thickness loss of dermis presenting as a shallow open ulcer with a red, pink wound bed without slough.  Wound Description (Comments): LEFT inner thigh-looks like a healed wound and has opened from moisture (a gash)  Present on Admission: Yes    Acute on chronic kidney disease stage IV with anasarca, bilateral pedal edema, acute on chronic diastolic CHF: She was treated with IV Lasix.  Serum creatinine on the day of discharge 3.16.  We will discharge her on p.o. Lasix 80 mg twice a day.  She will follow-up with nephrology Dr. Johnney Ou.  Patient seen by physical therapy recommended home health PT which is ordered.  Active problems Hydronephrosis -CT renal stone study on 3/11 showed moderate to marked chronic dilatation of the right renal collecting system, bilateral renal calculi, no obstructive uropathy. -Follow-up with urology on discharge. Patient also has chronic dysuria.   Bilateral lower extremity cellulitis -Patient was initially placed on IV ceftriaxone, then changed to IV ampicillin and meropenem on 3/15 -Urine culture showed Enterobacter and enterococcus, received Levaquin for 4 days.   - Cellulitis has improved significantly.  Enterobacter and enterococcus UTI with history of interstitial cystitis -Patient was placed  on IV ampicillin and meropenem on 02/14/2019, discussed with pharmacy,  narrow antibiotics down to Levaquin for 4 more days to complete 7 days of therapy. -Follows urology, Dr. Louis Meckel  Diabetes mellitus type 2, uncontrolled with renal complications, CKD Blood sugar stable.  She does not take anything at home.  Acute on chronic anemia/anemia of chronic disease, from CKD/iron deficiency anemia Stable  Obesity: Body mass index is 31.95 kg/m.  Pressure ulcer: Bilateral buttocks. Left thigh.  Constipation. Bowel regimen as well as enema.  Acute goutarthritis, right worse than left knee: -ESR, CRP, uric acid elevated -Started on colchicine and prednisone. No NSAIDs due to CKD  Estimated body mass index is 28.4 kg/m as calculated from the following:   Height as of this encounter: _0  (1.651 m).   Weight as of this encounter: 77.4 kg.  Discharge Instructions  Discharge Instructions    Call MD for:  difficulty breathing, headache or visual disturbances   Complete by:  As directed    Call MD for:  persistant nausea and vomiting   Complete by:  As directed    Call MD for:  severe uncontrolled pain   Complete by:  As directed    Diet - low sodium heart healthy   Complete by:  As directed    Increase activity slowly   Complete by:  As directed      Allergies as of 02/19/2019      Reactions   Baclofen Other (See Comments)   somnolence   Codeine Other (See Comments)   Increases Pain and couldn't sleep      Medication List    STOP taking these medications   allopurinol 100 MG tablet Commonly known as:  ZYLOPRIM   Lidocaine 5 % Crea   oxycodone 5 MG capsule Commonly known as:  OXY-IR     TAKE these medications   Biotin 5 MG Caps Take 1 capsule (5 mg total) by mouth daily.   cetirizine 10 MG tablet Commonly known as:  ZYRTEC Take 10 mg by mouth at bedtime.   colchicine 0.6 MG tablet Take 1 tablet (0.6 mg total) by mouth daily.   diazepam 10 MG  tablet Commonly known as:  VALIUM 10 mg See admin instructions. Insert 10 mg vaginally at bedtime   ferrous sulfate 325 (65 FE) MG tablet Take 1 tablet (325 mg total) by mouth 2 (two) times daily with a meal.   fluticasone 50 MCG/ACT nasal spray Commonly known as:  FLONASE Place 2 sprays into both nostrils daily as needed for allergies or rhinitis.   folic acid 1 MG tablet Commonly known as:  FOLVITE Take 1 tablet (1 mg total) by mouth daily.   furosemide 40 MG tablet Commonly known as:  Lasix Take 2 tablets (80 mg total) by mouth 2 (two) times daily.   gabapentin 300 MG capsule Commonly known as:  NEURONTIN Take 300 mg by mouth See admin instructions. Take one capsule on day 1, then 2 capsules day 2 then take 3 capsules thereafter   Gerhardt's butt cream Crea Apply 1 application topically 4 (four) times daily.   levothyroxine 75 MCG tablet Commonly known as:  SYNTHROID, LEVOTHROID Take 75 mcg by mouth daily before breakfast.   MULTI-VITAMIN PO Take 1 tablet by mouth daily.   Myrbetriq 50 MG Tb24 tablet Generic drug:  mirabegron ER Take 50 mg by mouth daily.   oxyCODONE-acetaminophen 5-325 MG tablet Commonly known as:  PERCOCET/ROXICET Take 1 tablet by mouth 2 (two) times daily.   pantoprazole 40 MG tablet Commonly known as:  PROTONIX  Take 1 tablet (40 mg total) by mouth 2 (two) times daily for 30 days. (take once daily after 1 month)   potassium chloride 10 MEQ tablet Commonly known as:  K-DUR Take 1 tablet (10 mEq total) by mouth 2 (two) times daily.   predniSONE 10 MG tablet Commonly known as:  DELTASONE Take 3 tabs daily for 3 days then 2 tabs daily for 3 days then 1 tab daily till done   simvastatin 40 MG tablet Commonly known as:  ZOCOR Take 0.5 tablets (20 mg total) by mouth at bedtime. What changed:  how much to take   sodium bicarbonate 650 MG tablet Take 2 tablets (1,300 mg total) by mouth 2 (two) times daily.   solifenacin 5 MG tablet Commonly  known as:  VESICARE Take 5 mg by mouth daily.   tamsulosin 0.4 MG Caps capsule Commonly known as:  FLOMAX Take 1 capsule (0.4 mg total) by mouth daily for 30 days.   Toviaz 4 MG Tb24 tablet Generic drug:  fesoterodine Take 4 mg by mouth at bedtime.   vitamin B-12 1000 MCG tablet Commonly known as:  CYANOCOBALAMIN Take 1 tablet (1,000 mcg total) by mouth daily.   VITAMIN C PO Take 1 tablet by mouth 2 (two) times daily.      Follow-up Information    Avva, Ravisankar, MD Follow up.   Specialty:  Internal Medicine Contact information: [address] [place] [address] [phone]        Justin Mend, MD Follow up.   Specialty:  Internal Medicine Contact information: [address] [place] [address] [phone]          Allergies  Allergen Reactions  . Baclofen Other (See Comments)    somnolence  . Codeine Other (See Comments)    Increases Pain and couldn't sleep    Consultations:  Nephrology   Procedures/Studies: Dg Chest 2 View  Result Date: 02/10/2019 CLINICAL DATA:  Acute onset of bilateral lower extremity edema. EXAM: CHEST - 2 VIEW COMPARISON:  Chest radiograph performed 01/16/2019. FINDINGS: The lungs are well-aerated and clear. There is no evidence of focal opacification, pleural effusion or pneumothorax. The heart is mildly enlarged. A pacemaker is noted at the left chest wall, with leads ending at the right atrium and right ventricle. No acute osseous abnormalities are seen. IMPRESSION: Mild cardiomegaly. Lungs remain grossly clear. Electronically Signed   By: Garald Balding M.D.   On: 02/10/2019 17:43   Dg Abd 1 View  Result Date: 02/13/2019 CLINICAL DATA:  Constipation EXAM: ABDOMEN - 1 VIEW COMPARISON:  CT abdomen/pelvis dated 02/10/2019 FINDINGS: Nonobstructive bowel gas pattern. Mild to moderate left colonic stool burden. Mild degenerative changes of the lumbar spine. IMPRESSION: Mild to moderate left colonic stool burden.  Electronically Signed   By: Julian Hy M.D.   On: 02/13/2019 08:14   Mr Lumbar Spine Wo Contrast  Result Date: 01/22/2019 CLINICAL DATA:  History of discitis/osteomyelitis at L4-5. EXAM: MRI LUMBAR SPINE WITHOUT CONTRAST TECHNIQUE: Multiplanar, multisequence MR imaging of the lumbar spine was performed. No intravenous contrast was administered. COMPARISON:  MRI lumbar spine since 05/13/2018 and 08/31/2018 FINDINGS: Examination is somewhat degraded by motion artifact, particularly the axial images. Segmentation: There are five lumbar type vertebral bodies. The last full intervertebral disc space is labeled L5-S1. This correlates with the prior MRIs. Alignment:  Normal Vertebrae: Resolution of the diskitis and osteomyelitis at L4-5. No residual marrow edema or paraspinal process. No findings for epidural abscess. Chronic endplate reactive changes  at L4-5. Conus medullaris and cauda equina: Conus extends to the L1 level. Conus and cauda equina appear normal. Paraspinal and other soft tissues: No signal paraspinal findings. No psoas muscle abscess or myositis. Persistent extrarenal pelvis on the right side. No adenopathy or aortic aneurysm. Disc levels: T12-L1: Stable small left paracentral disc extrusion with minimal impression on the left side of thecal sac. L1-2: Diffuse annular bulge and osteophytic ridging with mild bilateral lateral recess encroachment. There is also a persistent broad-based right foraminal, extraforaminal and far lateral disc protrusion. Possibly irritating the right L1 nerve root. L2-3: Diffuse bulging annulus with mild bilateral lateral recess stenosis, left greater than right. There is also mild left foraminal stenosis. L3-4: Diffuse bulging annulus and mild facet disease contributing to moderate bilateral lateral recess stenosis. Small focal extraforaminal disc protrusion on the right comes close to contacting the right L3 nerve root. This appears stable. L4-5: Collapsed disc space  related to prior diskitis and osteomyelitis. No findings for residual infection or inflammation. Degenerated bulging annulus but no spinal or significant lateral recess stenosis. There is mild persistent foraminal and extraforaminal encroachment on the right L4 nerve root. Moderate facet disease. L5-S1: No significant findings. IMPRESSION: 1. Resolution of diskitis and osteomyelitis at L4-5. 2. Stable multilevel disc protrusions with lateral recess and foraminal stenosis as detailed above. No new or progressive findings. Electronically Signed   By: Marijo Sanes M.D.   On: 01/22/2019 11:32   Ct Chest High Resolution  Result Date: 02/15/2019 CLINICAL DATA:  Shortness of breath, lung nodule. EXAM: CT CHEST WITHOUT CONTRAST TECHNIQUE: Multidetector CT imaging of the chest was performed following the standard protocol without intravenous contrast. High resolution imaging of the lungs, as well as inspiratory and expiratory imaging, was performed. COMPARISON:  CT abdomen pelvis 02/10/2019. FINDINGS: Cardiovascular: Atherosclerotic calcification of the aorta aortic valve and coronary arteries. Heart is enlarged. Small to moderate pericardial effusion is new from 02/10/2019. Pulmonic trunk is enlarged. Mediastinum/Nodes: Mediastinal lymph nodes measure up to 10 mm in right paratracheal station. Hilar regions are difficult to evaluate without IV contrast. No axillary adenopathy. Esophagus is grossly unremarkable. Lungs/Pleura: Negative for subpleural reticulation, traction bronchiectasis/bronchiolectasis, ground-glass, architectural distortion. Mild cylindrical bronchiectasis in both lower lobes with small bilateral pleural effusions, left greater than right, largely new 02/10/2019. Associated compressive atelectasis in both lower lobes. Lingular nodule measures 1.6 cm (series 4, image 87), as on 02/10/2019. Airway is unremarkable. No air trapping. Upper Abdomen: Visualized portions of the liver, gallbladder and adrenal  glands are unremarkable. Right hydronephrosis, partially imaged. Left renal stone. Visualized portions of the spleen, pancreas and stomach are grossly unremarkable. Upper abdominal lymph nodes are not enlarged by CT size criteria. Musculoskeletal: Degenerative changes in the spine. No worrisome lytic or sclerotic lesions. IMPRESSION: 1. No evidence of fibrotic interstitial lung disease. 2. Lingular nodule. Consider one of the following in 3 months for both low-risk and high-risk individuals: (a) repeat chest CT or follow-up PET-CT. This recommendation follows the consensus statement: Guidelines for Management of Incidental Pulmonary Nodules Detected on CT Images: From the Fleischner Society 2017; Radiology 2017; 284:228-243. 3. Small to moderate pericardial effusion, new from 02/10/2019. 4. Small bilateral pleural effusions, left greater than right, largely new 02/10/2019. 5. Partially imaged right hydronephrosis, better seen and described on 02/10/2019. 6. Left renal stone. 7. Borderline enlarged right paratracheal lymph node, likely reactive. 8. Aortic atherosclerosis (ICD10-170.0). Coronary artery calcification. 9. Enlarged pulmonic trunk, indicative of pulmonary arterial hypertension. Electronically Signed   By: Lorin Picket  M.D.   On: 02/15/2019 08:10   Mr Knee Left Wo Contrast  Result Date: 01/21/2019 CLINICAL DATA:  Fever of unknown origin. Severe knee pain. Negative left knee aspiration yesterday. EXAM: MRI OF THE LEFT KNEE WITHOUT CONTRAST TECHNIQUE: Multiplanar, multisequence MR imaging of the knee was performed. No intravenous contrast was administered. COMPARISON:  Left knee x-rays dated January 20, 2019. FINDINGS: MENISCI Medial meniscus: Longitudinal tear of the posterior horn. Radial tear of the body. Lateral meniscus:  Intact. LIGAMENTS Cruciates:  Intact ACL and PCL. Collaterals: Medial collateral ligament is intact. Lateral collateral ligament complex is intact. CARTILAGE Patellofemoral:  Diffuse moderate to high-grade partial-thickness cartilage loss. Medial: Large areas of full-thickness cartilage loss over the weight-bearing medial femoral condyle and medial tibial plateau. Lateral: Partial-thickness cartilage loss along the mesial aspect of the lateral compartment. Joint: Small joint effusion. Normal Hoffa's fat. No plical thickening. Popliteal Fossa:  No Baker cyst. Intact popliteus tendon. Extensor Mechanism: Intact quadriceps tendon and patellar tendon. Intact medial and lateral patellar retinaculum. Intact MPFL. Bones: No acute fracture or dislocation. No focal bone lesion. Tricompartmental osteophytes. Degenerative subchondral marrow edema in the peripheral medial femoral condyle and medial tibial plateau. Other: Mild soft tissue swelling about the knee. No fluid collection. No muscle edema. IMPRESSION: 1. No discrete evidence of soft tissue infection about the knee. 2. Longitudinal tear of the medial meniscus posterior horn. Radial tear of the medial meniscus body. 3. Tricompartmental osteoarthritis, moderate in the medial and patellofemoral compartments. Electronically Signed   By: Titus Dubin M.D.   On: 01/21/2019 15:23   Dg Knee Complete 4 Views Left  Result Date: 01/20/2019 CLINICAL DATA:  Severe LEFT knee pain for awhile, fluid drained from LEFT knee this morning, persistent pain especially with any movement, no known injury EXAM: LEFT KNEE - COMPLETE 4+ VIEW COMPARISON:  None FINDINGS: Osseous demineralization. Joint space narrowing and spur formation greatest at medial compartment. No acute fracture, dislocation, or bone destruction. Scattered soft tissue swelling. Small metallic foreign body at the medial soft tissues of the distal thigh. No significant joint effusion. IMPRESSION: Osteoarthritic changes and osseous demineralization of the LEFT knee. No acute abnormalities. Electronically Signed   By: Lavonia Dana M.D.   On: 01/20/2019 16:15   Ct Renal Stone Study  Result  Date: 02/10/2019 CLINICAL DATA:  Bilateral leg swelling since coming off of Lasix. Erythema of the left leg also noted. EXAM: CT ABDOMEN AND PELVIS WITHOUT CONTRAST TECHNIQUE: Multidetector CT imaging of the abdomen and pelvis was performed following the standard protocol without IV contrast. COMPARISON:  01/19/2019 FINDINGS: Lower chest: Noncalcified masslike opacity in the lingula is identified measuring 16 x 14 mm, series 7/. Bibasilar dependent atelectasis is identified. Possible additional tiny nodule in posterior right lower lobe measuring 3 mm. Left main and three-vessel coronary arteriosclerosis. Right-sided pacer leads are present. No significant pericardial effusion or thickening. Small hiatal hernia. Hepatobiliary: The unenhanced liver is unremarkable without apparent mass or biliary dilatation given limitations of a noncontrast study. Gallbladder contains layering sludge and/or calculi. No secondary signs of acute cholecystitis. A calcification is noted in the duodenum possibly representing a distal common bowel duct stone measuring 6 mm. Correlate with liver function tests. If necessary, MRCP or ERCP may help further assessment, series 3/39. Pancreas: Atrophic without inflammation, ductal dilatation or mass. Spleen: No splenomegaly Adrenals/Urinary Tract: Normal bilateral adrenal glands. A 5 mm nonobstructing left upper pole renal calculus is identified. At least 4-5 renal stones are noted in the interpolar aspect of the  right kidney, the largest measuring up to approximately 4 mm with moderate to marked dilatation of the right renal collecting system. Right-sided urothelial thickening is noted the level of the urinary bladder. The bladder is also thickened in appearance some which is due to underdistention but cystitis is also possibility. No definite ureteral stones are identified. Stomach/Bowel: Small hiatal hernia. Physiologic distention of the stomach with normal small bowel rotation. No small  bowel obstruction are inflammation. Moderate to marked stool retention throughout the colon consistent with constipation. Appendix appears normal. Vascular/Lymphatic: Aortic atherosclerosis. No enlarged abdominal or pelvic lymph nodes. Reproductive: Uterus and bilateral adnexa are unremarkable. Other: Presacral edema.  No free air nor free fluid. Musculoskeletal: Lumbar spondylosis most marked at L4-5 with discogenic sclerosis. IMPRESSION: 1. **An incidental finding of potential clinical significance has been found. Lingular masslike abnormality measuring 16 x 14 mm, partially included on this study. Dedicated chest CT is recommended. Findings raise concern for pulmonary neoplasm. Additional tiny 3 mm nodule in the subpleural right lower lobe is indeterminate for neoplasm versus post inflammatory/infectious change.** 2. Bilateral renal calculi are identified without obstructive uropathy. Moderate-to-marked chronic dilatation of the right renal collecting system with diffuse urothelial and bladder wall thickening raise concern for stigmata urinary tract infection. Correlate. 3. Stool is noted throughout the colon consistent with constipation. 4. 6 mm calcification in the region of the second portion of the duodenum may represent choledocholithiasis. Correlate with liver function tests. Additional layering gallstones and calculi without secondary signs of cholecystitis are noted within the gallbladder. Electronically Signed   By: Ashley Royalty M.D.   On: 02/10/2019 20:54    (Echo, Carotid, EGD, Colonoscopy, ERCP)    Subjective: Patient resting in bed she does not have any new complaints today no nausea vomiting diarrhea chest pain shortness of breath or cough she lives alone but she has a lot of family support around her not far from her children and her brother her few minutes to few blocks from her.  Discharge Exam: Vitals:   02/19/19 0406 02/19/19 0820  BP: 134/64 (!) 150/80  Pulse: 69 78  Resp: 20 18   Temp: 98 F (36.7 C)   SpO2: 94% 100%   Vitals:   02/18/19 1606 02/18/19 2023 02/19/19 0406 02/19/19 0820  BP: (!) 154/82 (!) 144/73 134/64 (!) 150/80  Pulse: 72 67 69 78  Resp:  _0 Temp: 98.1 F (36.7 C) 97.8 F (36.6 C) 98 F (36.7 C)   TempSrc: Oral Oral Oral   SpO2: 94% 95% 94% 100%  Weight:  77.4 kg    Height:        General: Pt is alert, awake, not in acute distress Cardiovascular: RRR, S1/S2 +, no rubs, no gallops Respiratory: CTA bilaterally, no wheezing, no rhonchi Abdominal: Soft, NT, ND, bowel sounds + Extremities: 1+ pitting edema bilaterally    The results of significant diagnostics from this hospitalization (including imaging, microbiology, ancillary and laboratory) are listed below for reference.     Microbiology: Recent Results (from the past 240 hour(s))  Culture, Urine     Status: Abnormal   Collection Time: 02/11/19 12:25 AM  Result Value Ref Range Status   Specimen Description URINE, CLEAN CATCH  Final   Special Requests   Final    NONE Performed at [facility], [address], [place], [place] [address]    Culture (A)  Final    50,000 COLONIES/mL ENTEROBACTER CLOACAE 20,000 COLONIES/mL ENTEROCOCCUS FAECALIS    Report  Status 02/14/2019 FINAL  Final   Organism ID, Bacteria ENTEROBACTER CLOACAE (A)  Final   Organism ID, Bacteria ENTEROCOCCUS FAECALIS (A)  Final      Susceptibility   Enterobacter cloacae - MIC*    CEFAZOLIN >=64 RESISTANT Resistant     CEFTRIAXONE >=64 RESISTANT Resistant     CIPROFLOXACIN <=0.25 SENSITIVE Sensitive     GENTAMICIN <=1 SENSITIVE Sensitive     IMIPENEM 1 SENSITIVE Sensitive     NITROFURANTOIN 128 RESISTANT Resistant     TRIMETH/SULFA <=20 SENSITIVE Sensitive     PIP/TAZO >=128 RESISTANT Resistant     * 50,000 COLONIES/mL ENTEROBACTER CLOACAE   Enterococcus faecalis - MIC*    AMPICILLIN <=2 SENSITIVE Sensitive     LEVOFLOXACIN 1 SENSITIVE Sensitive     NITROFURANTOIN <=16 SENSITIVE Sensitive      VANCOMYCIN 1 SENSITIVE Sensitive     * 20,000 COLONIES/mL ENTEROCOCCUS FAECALIS  Culture, blood (routine x 2)     Status: None   Collection Time: 02/11/19  3:10 PM  Result Value Ref Range Status   Specimen Description BLOOD RIGHT ANTECUBITAL  Final   Special Requests   Final    BOTTLES DRAWN AEROBIC ONLY Blood Culture adequate volume   Culture   Final    NO GROWTH 5 DAYS Performed at [facility], [place] [address] [place], [place] [address]    Report Status 02/16/2019 FINAL  Final  Culture, blood (routine x 2)     Status: None   Collection Time: 02/11/19  3:15 PM  Result Value Ref Range Status   Specimen Description BLOOD RIGHT HAND  Final   Special Requests   Final    BOTTLES DRAWN AEROBIC ONLY Blood Culture adequate volume   Culture   Final    NO GROWTH 5 DAYS Performed at [facility], [place] [address], [place], [place] [address]    Report Status 02/16/2019 FINAL  Final     Labs: BNP (last 3 results) Recent Labs    06/07/18 0227 02/10/19 1715  BNP 228.6* 32.2   Basic Metabolic Panel: Recent Labs  Lab 02/14/19 0256 02/15/19 0242 02/16/19 0814 02/17/19 0508 02/18/19 0452  NA 133* 134* 133* 130* 129*  K 3.4* 3.9 3.3* 3.8 4.1  CL 98 101 97* 91* 88*  CO2 21* 20* _0 GLUCOSE 110* 89 109* 145* 126*  BUN 59* 56* 53* 56* 61*  CREATININE 3.58* 3.66* 3.54* 3.42* 3.16*  CALCIUM 9.4 9.5 9.4 9.7 10.3  PHOS 2.8 2.8 3.7 3.4 3.0   Liver Function Tests: Recent Labs  Lab 02/14/19 0256 02/15/19 0242 02/16/19 0814 02/17/19 0508 02/18/19 0452  AST 10* 12* 12* 11* 16  ALT _1 ALKPHOS 83 84 84 80 69  BILITOT 0.4 0.5 0.8 0.9 0.4  PROT 5.9* 6.3* 6.0* 6.8 6.7  ALBUMIN 2.6*  2.6* 2.5*  2.5* 2.4*  2.4* 3.3*  3.3* 4.1  4.1   No results for input(s): LIPASE, AMYLASE in the last 168 hours. No results for input(s): AMMONIA in the last 168 hours. CBC: Recent Labs  Lab 02/13/19 0535 02/13/19 0942 02/14/19 0256 02/15/19 0242 02/16/19 0814   WBC 6.5  --  6.2 6.6 7.7  HGB 7.7* 7.8* 7.8* 7.8* 8.2*  HCT 23.2* 23.7* 24.5* 24.5* 24.8*  MCV 87.2  --  88.1 88.1 88.9  PLT 182  --  211 247 240   Cardiac Enzymes: No results for input(s): CKTOTAL, CKMB, CKMBINDEX, TROPONINI in  the last 168 hours. BNP: Invalid input(s): POCBNP CBG: Recent Labs  Lab 02/18/19 0658 02/18/19 1118 02/18/19 1608 02/18/19 2024 02/19/19 0722  GLUCAP 124* 160* 206* 203* 125*   D-Dimer No results for input(s): DDIMER in the last 72 hours. Hgb A1c No results for input(s): HGBA1C in the last 72 hours. Lipid Profile No results for input(s): CHOL, HDL, LDLCALC, TRIG, CHOLHDL, LDLDIRECT in the last 72 hours. Thyroid function studies No results for input(s): TSH, T4TOTAL, T3FREE, THYROIDAB in the last 72 hours.  Invalid input(s): FREET3 Anemia work up No results for input(s): VITAMINB12, FOLATE, FERRITIN, TIBC, IRON, RETICCTPCT in the last 72 hours. Urinalysis    Component Value Date/Time   COLORURINE YELLOW 02/10/2019 1726   APPEARANCEUR TURBID (A) 02/10/2019 1726   LABSPEC 1.008 02/10/2019 1726   PHURINE 5.0 02/10/2019 1726   GLUCOSEU NEGATIVE 02/10/2019 1726   HGBUR SMALL (A) 02/10/2019 1726   BILIRUBINUR NEGATIVE 02/10/2019 1726   KETONESUR NEGATIVE 02/10/2019 1726   PROTEINUR 30 (A) 02/10/2019 1726   NITRITE NEGATIVE 02/10/2019 1726   LEUKOCYTESUR LARGE (A) 02/10/2019 1726   Sepsis Labs Invalid input(s): PROCALCITONIN,  WBC,  LACTICIDVEN Microbiology Recent Results (from the past 240 hour(s))  Culture, Urine     Status: Abnormal   Collection Time: 02/11/19 12:25 AM  Result Value Ref Range Status   Specimen Description URINE, CLEAN CATCH  Final   Special Requests   Final    NONE Performed at [facility], [place] [address], [place], [place] [address]    Culture (A)  Final    50,000 COLONIES/mL ENTEROBACTER CLOACAE 20,000 COLONIES/mL ENTEROCOCCUS FAECALIS    Report Status 02/14/2019 FINAL  Final   Organism ID, Bacteria  ENTEROBACTER CLOACAE (A)  Final   Organism ID, Bacteria ENTEROCOCCUS FAECALIS (A)  Final      Susceptibility   Enterobacter cloacae - MIC*    CEFAZOLIN >=64 RESISTANT Resistant     CEFTRIAXONE >=64 RESISTANT Resistant     CIPROFLOXACIN <=0.25 SENSITIVE Sensitive     GENTAMICIN <=1 SENSITIVE Sensitive     IMIPENEM 1 SENSITIVE Sensitive     NITROFURANTOIN 128 RESISTANT Resistant     TRIMETH/SULFA <=20 SENSITIVE Sensitive     PIP/TAZO >=128 RESISTANT Resistant     * 50,000 COLONIES/mL ENTEROBACTER CLOACAE   Enterococcus faecalis - MIC*    AMPICILLIN <=2 SENSITIVE Sensitive     LEVOFLOXACIN 1 SENSITIVE Sensitive     NITROFURANTOIN <=16 SENSITIVE Sensitive     VANCOMYCIN 1 SENSITIVE Sensitive     * 20,000 COLONIES/mL ENTEROCOCCUS FAECALIS  Culture, blood (routine x 2)     Status: None   Collection Time: 02/11/19  3:10 PM  Result Value Ref Range Status   Specimen Description BLOOD RIGHT ANTECUBITAL  Final   Special Requests   Final    BOTTLES DRAWN AEROBIC ONLY Blood Culture adequate volume   Culture   Final    NO GROWTH 5 DAYS Performed at [facility], [place] [address], [place], [place] [address]    Report Status 02/16/2019 FINAL  Final  Culture, blood (routine x 2)     Status: None   Collection Time: 02/11/19  3:15 PM  Result Value Ref Range Status   Specimen Description BLOOD RIGHT HAND  Final   Special Requests   Final    BOTTLES DRAWN AEROBIC ONLY Blood Culture adequate volume   Culture   Final    NO GROWTH 5 DAYS Performed at [facility], [address] [place], [place] [address]    Report Status 02/16/2019 FINAL  Final     Time coordinating discharge: Over 30 minutes  SIGNED:   Georgette Shell, MD  [facility] 02/19/2019, 8:24 AM Pager   If 7PM-7AM, please contact night-coverage [email or web address] Password [email or web address]

## 2019-02-19 NOTE — Progress Notes (Signed)
Physical Therapy Treatment Patient Details Name: Erika Fry MRN: [id number] DOB: May 20, 1946 Today's Date: 02/19/2019    History of Present Illness 73 year old female with past medical history significant for CKD stage IV, diabetes mellitus, obesity, hypertension, hyperlipidemia, nephrolithiasis, chronic right-sided hydronephrosis, gout and arthritis.  Patient was admitted with worsening leg edema, volume overload, worsening renal function, bilateral lower leg cellulitis and dysuria.  Work-up has revealed UTI secondary to Enterobacter cloacae and faecalis.  CT scan renal stone done revealed lingular mass/nodule.    PT Comments    Patient received up in chair, pleasant and willing to work with PT but reporting increased urinary urge with burning with urination as well as significant increase in urgency/frequency, discussed this with RN at [facility]. Able to perform multiple stand-pivot transfers to [facility] with Min guard and no device and also able to maintain static standing balance to clean self in standing with no UE support and close min guard. Tolerated gait training 36f x2 in room with S and RW, one rest break in between laps and VSS with gait. She was left up in the chair with all needs met this morning.     Follow Up Recommendations  Home health PT     Equipment Recommendations  None recommended by PT(has all appropriate DME )    Recommendations for Other Services       Precautions / Restrictions Precautions Precautions: Fall Precaution Comments: Poor safety awareness at times Restrictions Weight Bearing Restrictions: No    Mobility  Bed Mobility Overal bed mobility: Needs Assistance Bed Mobility: Supine to Sit     Supine to sit: Min guard     General bed mobility comments: OOB in chair   Transfers Overall transfer level: Needs assistance Equipment used: Rolling walker (2 wheeled) Transfers: [facility] to Stand: Min guard Stand pivot  transfers: Min guard       General transfer comment: min guard for safety, no physical assist given. Able to perform sit to stand and [facility] and close min guard safely   Ambulation/Gait Ambulation/Gait assistance: Supervision Gait Distance (Feet): 30 Feet(x2) Assistive device: Rolling walker (2 wheeled) Gait Pattern/deviations: Step-through pattern;Decreased step length - right;Decreased step length - left;Trunk flexed;Narrow base of support Gait velocity: Decreased   General Gait Details: S with RW, gait tolerance much improved today and able to tolerate gait training 2 358flaps in room with one rest break in between. VSS.    Stairs             Wheelchair Mobility    Modified Rankin (Stroke Patients Only)       Balance Overall balance assessment: Needs assistance Sitting-balance support: Feet supported;Single extremity supported;Bilateral upper extremity supported Sitting balance-Leahy Scale: Good     Standing balance support: Bilateral upper extremity supported;During functional activity Standing balance-Leahy Scale: Fair Standing balance comment: heavy reliance on B UE support during gait, but able to maintain balance with no UEs/min guard to clean self in front of BSC                             Cognition Arousal/Alertness: Awake/alert Behavior During Therapy: Anxious;WFL for tasks assessed/performed Overall Cognitive Status: Within Functional Limits for tasks assessed  Exercises      General Comments        Pertinent Vitals/Pain Pain Assessment: Faces Faces Pain Scale: Hurts little more Pain Location: Pt reports burning during and after urination Pain Descriptors / Indicators: Burning Pain Intervention(s): Monitored during session;Patient requesting pain meds-RN notified    Home Living                      Prior Function            PT Goals  (current goals can now be found in the care plan section) Acute Rehab PT Goals Patient Stated Goal: home PT Goal Formulation: With patient Time For Goal Achievement: 02/16/19 Potential to Achieve Goals: Good Progress towards PT goals: Progressing toward goals    Frequency    Min 3X/week      PT Plan Current plan remains appropriate    Co-evaluation              AM-PAC PT "6 Clicks" Mobility   Outcome Measure  Help needed turning from your back to your side while in a flat bed without using bedrails?: A Little Help needed moving from lying on your back to sitting on the side of a flat bed without using bedrails?: A Little Help needed moving to and from a bed to a chair (including a wheelchair)?: A Little Help needed standing up from a chair using your arms (e.g., wheelchair or bedside chair)?: A Little Help needed to walk in hospital room?: A Little Help needed climbing 3-5 steps with a railing? : A Little 6 Click Score: 18    End of Session   Activity Tolerance: Patient limited by fatigue Patient left: in chair;with call bell/phone within reach Nurse Communication: Other (comment);Patient requests pain meds(burning/urgency/frequency of urination ) PT Visit Diagnosis: Other abnormalities of gait and mobility (R26.89);Muscle weakness (generalized) (M62.81);Pain     Time: 1133-1205 PT Time Calculation (min) (ACUTE ONLY): 32 min  Charges:  $Gait Training: 8-22 mins $Therapeutic Activity: 8-22 mins                     Deniece Ree PT, DPT, CBIS  Supplemental Physical Therapist [place]    Pager [phone] Acute Rehab Office [phone]

## 2019-02-19 NOTE — Progress Notes (Deleted)
Patient Discharge: Disposition: Patient discharged to home with home health. Education: Reviewed medications, prescriptions, follow-up appointments and discharge instructions, verbalized understanding. IV: Discontinued IV before discharge. Telemetry: Discontinued Tele before discharge, CCMD notified. Transportation: Patient escorted out of the unit in w/c.   Belongings: she took all her belongings with her.

## 2019-02-19 NOTE — Progress Notes (Signed)
Occupational Therapy Treatment Patient Details Name: Erika Fry MRN: [id number] DOB: 1946-03-26 Today's Date: 02/19/2019    History of present illness 73 year old female with past medical history significant for CKD stage IV, diabetes mellitus, obesity, hypertension, hyperlipidemia, nephrolithiasis, chronic right-sided hydronephrosis, gout and arthritis.  Patient was admitted with worsening leg edema, volume overload, worsening renal function, bilateral lower leg cellulitis and dysuria.  Work-up has revealed UTI secondary to Enterobacter cloacae and faecalis.  CT scan renal stone done revealed lingular mass/nodule.   OT comments  Pt progressing towards acute OT goals. Fatigues with activity, incorporated rest breaks. Discussed energy conservation strategies and ways to build activity tolerance. D/c plan remains appropriate.    Follow Up Recommendations  Home health OT;Supervision/Assistance - 24 hour(initial 24 hr supervision)    Equipment Recommendations  None recommended by OT    Recommendations for Other Services      Precautions / Restrictions Precautions Precautions: Fall Precaution Comments: Poor safety awareness at times Restrictions Weight Bearing Restrictions: No       Mobility Bed Mobility Overal bed mobility: Needs Assistance Bed Mobility: Supine to Sit     Supine to sit: Min guard     General bed mobility comments: Extra time and effort. Min guard for safety  Transfers Overall transfer level: Needs assistance Equipment used: Rolling walker (2 wheeled) Transfers: Sit to/from [facility] Sit to Stand: Min guard Stand pivot transfers: Min guard       General transfer comment: min guard for safety and to steady. from EOB to [facility] to recliner    Balance Overall balance assessment: Needs assistance Sitting-balance support: Feet supported;Single extremity supported;Bilateral upper extremity supported Sitting balance-Leahy Scale: Good      Standing balance support: Bilateral upper extremity supported;Single extremity supported;During functional activity Standing balance-Leahy Scale: Fair                             ADL either performed or assessed with clinical judgement   ADL Overall ADL's : Needs assistance/impaired                     Lower Body Dressing: Sit to/from stand;Minimal assistance   Toilet Transfer: Min Geophysical data processor Details (indicate cue type and reason): encouraged to use BSC vs puriwick. encouraged to walk to bathroom next time she needs to void. Discussed home setup for toileting. Toileting- Clothing Manipulation and Hygiene: Minimal assistance;Sit to/from stand Toileting - Clothing Manipulation Details (indicate cue type and reason): assist to don undergarment, min guard to steady       General ADL Comments: Pt completed bed mobility, pericare, and pivotal steps to recliner. Discussed energy conservation, building activity tolerance.     Vision       Perception     Praxis      Cognition Arousal/Alertness: Awake/alert Behavior During Therapy: Anxious;WFL for tasks assessed/performed Overall Cognitive Status: Within Functional Limits for tasks assessed                                          Exercises     Shoulder Instructions       General Comments      Pertinent Vitals/ Pain       Pain Assessment: Faces Faces Pain Scale: Hurts little more Pain Location: Pt reports burning during and after urination Pain Descriptors /  Indicators: Burning Pain Intervention(s): Monitored during session  Home Living                                          Prior Functioning/Environment              Frequency  Min 2X/week        Progress Toward Goals  OT Goals(current goals can now be found in the care plan section)  Progress towards OT goals: Progressing toward goals  Acute Rehab OT  Goals Patient Stated Goal: home OT Goal Formulation: With patient Time For Goal Achievement: 03/04/19 Potential to Achieve Goals: Good ADL Goals Pt Will Perform Grooming: with modified independence;standing Pt Will Perform Upper Body Bathing: with set-up;sitting Pt Will Perform Lower Body Bathing: with modified independence;sit to/from stand Pt Will Perform Upper Body Dressing: with set-up;sitting Pt Will Perform Lower Body Dressing: with modified independence;sit to/from stand Pt Will Transfer to Toilet: with supervision;ambulating Pt Will Perform Toileting - Clothing Manipulation and hygiene: with modified independence;sit to/from stand Additional ADL Goal #1: Pt will complete bed mobiliyt with mod A to sit EOB in prep for ADLs/selfcare  Plan Discharge plan remains appropriate    Co-evaluation                 AM-PAC OT "6 Clicks" Daily Activity     Outcome Measure   Help from another person eating meals?: None Help from another person taking care of personal grooming?: A Little Help from another person toileting, which includes using toliet, bedpan, or urinal?: A Little Help from another person bathing (including washing, rinsing, drying)?: A Little Help from another person to put on and taking off regular upper body clothing?: A Little Help from another person to put on and taking off regular lower body clothing?: A Little 6 Click Score: 19    End of Session Equipment Utilized During Treatment: Rolling walker  OT Visit Diagnosis: Muscle weakness (generalized) (M62.81);Pain;Other abnormalities of gait and mobility (R26.89)   Activity Tolerance Patient limited by fatigue   Patient Left in chair;with call bell/phone within reach;with chair alarm set   Nurse Communication Other (comment)(Pt reporting SOB at start of session. O2 sat 98)        Time: 2951-8841 OT Time Calculation (min): 52 min  Charges: OT General Charges $OT Visit: 1 Visit OT Treatments $Self  Care/Home Management : 38-52 mins  Tyrone Schimke, OT Acute Rehabilitation Services Pager: [phone] Office: [phone]    Erika Fry 02/19/2019, 10:00 AM

## 2019-02-19 NOTE — Care Management Important Message (Signed)
Important Message  Patient Details  Name: Erika Fry MRN: [id number] Date of Birth: May 04, 1946   Medicare Important Message Given:  Yes    [name] 02/19/2019, 3:43 PM

## 2019-02-19 NOTE — Progress Notes (Signed)
  Patient Discharge: Disposition: Patient discharged to home with home health. Education: Reviewed medications, prescriptions, follow-up appointments and discharge instructions, verbalized understanding. IV: Discontinued IV before discharge. Telemetry: Discontinued Tele before discharge, CCMD notified. Transportation: Patient escorted out of the unit in w/c.   Belongings: she took all her belongings with her.

## 2019-02-24 ENCOUNTER — Other Ambulatory Visit: Payer: Self-pay

## 2019-02-24 ENCOUNTER — Ambulatory Visit: Payer: Medicare Other | Admitting: Cardiology

## 2019-02-24 ENCOUNTER — Encounter: Payer: Self-pay | Admitting: Cardiology

## 2019-02-24 VITALS — BP 120/66 | HR 75 | Ht 65.0 in | Wt 175.2 lb

## 2019-02-24 DIAGNOSIS — I5032 Chronic diastolic (congestive) heart failure: Secondary | ICD-10-CM | POA: Insufficient documentation

## 2019-02-24 DIAGNOSIS — I129 Hypertensive chronic kidney disease with stage 1 through stage 4 chronic kidney disease, or unspecified chronic kidney disease: Secondary | ICD-10-CM

## 2019-02-24 DIAGNOSIS — Z950 Presence of cardiac pacemaker: Secondary | ICD-10-CM | POA: Insufficient documentation

## 2019-02-24 DIAGNOSIS — R600 Localized edema: Secondary | ICD-10-CM

## 2019-02-24 DIAGNOSIS — N184 Chronic kidney disease, stage 4 (severe): Secondary | ICD-10-CM

## 2019-02-24 NOTE — Progress Notes (Signed)
Follow up visit  Subjective:   Erika Fry, female    DOB: 23-Jan-1946, 73 y.o.   MRN: [id number]   Chief Complaint  Patient presents with  . 2nd Degree Heart Block  . Hypertension  . Follow-up    31mo     HPI  73 y/o Caucasian female with hypertension, CKD IV, type 2 DM, hyperlipidemia, s/p Medtronic dual chamber pacemaker for symptomatic high grade AV block, hypothyroidism, anemia, osteopenia, venous insufficiency.  Patient was recently admitted to the hospital in mid March 2020 with complaints of leg edema.  She was found to have acute kidney injury, thought to be combination of worsening CKD as well as nephrolithiasis and right-sided hydronephrosis.  She was also found to have UTI secondary to Enterobacter cloacae and faecalis. Creatinine increased to above 4, but improved to 3.1 by discharge. Echocardiogram showed grade 2 diastolic dysfunction, severe LA enlargement, small posterior pericardial effusion. Workup in the hospital was also notable for elevated kappa, lamda, as well as Kappa/lambda ratio, without M spike.  Patient continues to have bilateral leg swelling, although improved compared to her hospital stay. She has follow up next month with her nephrologist Dr. Johnney Ou.   Past Medical History:  Diagnosis Date  . Arthritis    "knees, thumbs" (03/25/2018)  . CKD (chronic kidney disease), stage IV ([place])   . Diet-controlled diabetes mellitus ([place])   . GERD (gastroesophageal reflux disease)   . Gout    "on daily RX" (03/25/2018)  . Heart murmur   . High cholesterol   . Hypertension   . Hypothyroidism   . Iron deficiency anemia    "had to get an iron infusion"  . Migraine    "used to have them growing up" (03/25/2018)  . Presence of permanent cardiac pacemaker 03/25/2018     Past Surgical History:  Procedure Laterality Date  . APPENDECTOMY    . BIOPSY  05/19/2018   Procedure: BIOPSY;  Surgeon: Ladene Artist, MD;  Location: [place];  Service:  Endoscopy;;  . Consuela Mimes W/ RETROGRADES Bilateral 01/14/2019   Procedure: CYSTOSCOPY WITH RETROGRADE PYELOGRAM BILATERAL HYDRODISTENTION;  Surgeon: Ardis Hughs, MD;  Location: [facility];  Service: Urology;  Laterality: Bilateral;  . ESOPHAGOGASTRODUODENOSCOPY N/A 05/19/2018   Procedure: ESOPHAGOGASTRODUODENOSCOPY (EGD);  Surgeon: Ladene Artist, MD;  Location: [facility];  Service: Endoscopy;  Laterality: N/A;  . ESOPHAGOGASTRODUODENOSCOPY (EGD) WITH PROPOFOL N/A 01/28/2019   Procedure: ESOPHAGOGASTRODUODENOSCOPY (EGD) WITH PROPOFOL;  Surgeon: Rush Landmark Telford Nab., MD;  Location: [place];  Service: Gastroenterology;  Laterality: N/A;  . INSERT / REPLACE / REMOVE PACEMAKER  03/25/2018  . IR FLUORO GUIDE CV LINE RIGHT  05/18/2018  . IR LUMBAR DISC ASPIRATION W/IMG GUIDE  05/15/2018  . IR REMOVAL TUN CV CATH W/O FL  07/23/2018  . IR US GUIDE VASC ACCESS RIGHT  05/18/2018  . KNEE ARTHROSCOPY Bilateral   . PACEMAKER IMPLANT N/A 03/25/2018   Procedure: PACEMAKER IMPLANT;  Surgeon: Evans Lance, MD;  Location: [place] [facility];  Service: Cardiovascular;  Laterality: N/A;  . PACEMAKER PLACEMENT Left 03/2018  . POLYPECTOMY  01/28/2019   Procedure: POLYPECTOMY;  Surgeon: Mansouraty, Telford Nab., MD;  Location: [place];  Service: Gastroenterology;;  . RIGHT HEART CATH N/A 03/20/2018   Procedure: RIGHT HEART CATH;  Surgeon: Nigel Mormon, MD;  Location: [place] [facility];  Service: Cardiovascular;  Laterality: N/A;  . TEE WITHOUT CARDIOVERSION N/A 03/20/2018   Procedure: TRANSESOPHAGEAL ECHOCARDIOGRAM (TEE);  Surgeon: Nigel Mormon, MD;  Location: [facility];  Service: Cardiovascular;  Laterality: N/A;  . TONSILLECTOMY    . URETEROSCOPY WITH HOLMIUM LASER LITHOTRIPSY Right 01/14/2019   Procedure: URETEROSCOPY WITH HOLMIUM LASER LITHOTRIPSY BLADDER BIOPSIES RIGHT STENT PLACEMENT;  Surgeon: Ardis Hughs, MD;  Location: [facility];  Service: Urology;  Laterality: Right;      Current Outpatient Medications on File Prior to Visit  Medication Sig Dispense Refill  . acetaminophen (TYLENOL) 325 MG tablet Take 650 mg by mouth as needed.    . Ascorbic Acid (VITAMIN C PO) Take 1 tablet by mouth 2 (two) times daily.    . Biotin 5 MG CAPS Take 1 capsule (5 mg total) by mouth daily.  0  . cetirizine (ZYRTEC) 10 MG tablet Take 10 mg by mouth at bedtime.     . colchicine 0.6 MG tablet Take 1 tablet (0.6 mg total) by mouth daily. 30 tablet 0  . diazepam (VALIUM) 10 MG tablet 10 mg See admin instructions. Insert 10 mg vaginally at bedtime    . ferrous sulfate 325 (65 FE) MG tablet Take 1 tablet (325 mg total) by mouth 2 (two) times daily with a meal. 60 tablet 0  . fluticasone (FLONASE) 50 MCG/ACT nasal spray Place 2 sprays into both nostrils daily as needed for allergies or rhinitis.    . folic acid (FOLVITE) 1 MG tablet Take 1 tablet (1 mg total) by mouth daily. 30 tablet 0  . furosemide (LASIX) 40 MG tablet Take 2 tablets (80 mg total) by mouth 2 (two) times daily. 60 tablet 1  . gabapentin (NEURONTIN) 300 MG capsule Take 300 mg by mouth See admin instructions. Take one capsule on day 1, then 2 capsules day 2 then take 3 capsules thereafter    . Hydrocortisone (GERHARDT'S BUTT CREAM) CREA Apply 1 application topically 4 (four) times daily.    [name] Kitchen levofloxacin (LEVAQUIN) 250 MG tablet Take 250 mg by mouth daily.    [name] Kitchen levothyroxine (SYNTHROID, LEVOTHROID) 75 MCG tablet Take 75 mcg by mouth daily before breakfast.    . mirabegron ER (MYRBETRIQ) 50 MG TB24 tablet Take 50 mg by mouth daily.    . Multiple Vitamin (MULTI-VITAMIN PO) Take 1 tablet by mouth daily.    [name] Kitchen oxyCODONE-acetaminophen (PERCOCET/ROXICET) 5-325 MG tablet Take 1 tablet by mouth 2 (two) times daily.    . pantoprazole (PROTONIX) 40 MG tablet Take 1 tablet (40 mg total) by mouth 2 (two) times daily for 30 days. (take once daily after 1 month) 60 tablet 0  . potassium chloride (K-DUR) 10 MEQ tablet Take 1 tablet  (10 mEq total) by mouth 2 (two) times daily. 60 tablet 1  . predniSONE (DELTASONE) 10 MG tablet Take 3 tabs daily for 3 days then 2 tabs daily for 3 days then 1 tab daily till done 30 tablet 0  . simvastatin (ZOCOR) 40 MG tablet Take 0.5 tablets (20 mg total) by mouth at bedtime. (Patient taking differently: Take 40 mg by mouth at bedtime. ) 15 tablet 0  . sodium bicarbonate 650 MG tablet Take 2 tablets (1,300 mg total) by mouth 2 (two) times daily. 60 tablet 0  . solifenacin (VESICARE) 5 MG tablet Take 5 mg by mouth daily.    . tamsulosin (FLOMAX) 0.4 MG CAPS capsule Take 1 capsule (0.4 mg total) by mouth daily for 30 days. 30 capsule 0  . vitamin B-12 (CYANOCOBALAMIN) 1000 MCG tablet Take 1 tablet (1,000 mcg total) by mouth daily. 30 tablet 0   No current  facility-administered medications on file prior to visit.     Cardiovascular studies:  Hospital echocardiogram 02/16/2019:  1. The left ventricle has normal systolic function, with an ejection fraction of 55-60%. The cavity size was normal. There is moderately increased left ventricular wall thickness. Left ventricular diastolic Doppler parameters are consistent with  impaired relaxation. Elevated left atrial and left ventricular end-diastolic pressures The E/e' is >15. No evidence of left ventricular regional wall motion abnormalities.  2. The right ventricle has normal systolic function. The cavity was normal. There is no increase in right ventricular wall thickness.  3. Left atrial size was severely dilated.  4. Right atrial size was mildly dilated.  5. The mitral valve is degenerative. Mild thickening of the mitral valve leaflet. There is mild to moderate mitral annular calcification present.  6. The aortic valve is tricuspid Mild sclerosis of the aortic valve. Aortic valve regurgitation was not assessed by color flow Doppler.  7. The inferior vena cava was dilated in size with >50% respiratory variability.  8. Small pericardial  effusion.  9. The pericardial effusion is circumferential. 10. When compared to the prior study: 03/20/2018: LVEF 65-70%, mild MR, no pericardial effusion.   Carotid artery duplex 03/10/2018: Minimal stenosis in the right internal carotid artery (1-15%). Stenosis in the left internal carotid artery (16-49%). Antegrade right vertebral artery flow. Antegrade left vertebral artery flow. Follow up in one year is appropriate if clinically indicated.  TEE 03/20/2018: - Left ventricle: There was moderate concentric hypertrophy with  severe basal septal hypertrophy- likely reason for LVOT gradient.  No SAM, no sub aortic membrane. Hyperdynamic LV> LVEF >70%. Wall  motion was normal; there were no regional wall motion  abnormalities. - Aortic valve: Mild annular calcification. Normal leaflets with  normal excursion. Mean PG 33 mmHg, Vmax 4 m/sec, however gradient  arising throughout LV outflow tract without significant aortic  valvular stenosis. AVA by continuity equation at least 1.4 cm2. - Mitral valve: Mildly to moderately calcified annulus. Normal  thickness leaflets . There was mild regurgitation. - Left atrium: No evidence of thrombus in the atrial cavity or  appendage.   Impressions:   - Hyperdynamic LV with severe basal septal hypertrophy and LVOT  obstruction. No significant valvular stenosis.  Right heart cath 04/19: RA pressure 3 mmhg RVSP 48 mmHg, RVEDP 6 mmhg PAP 42/9 mmhg, Mean Pap 20 mmhg CO 6.8 L/min, CI 3.3 L/min/m2   No pulmonary hypertension Normal cardiac output  Lexiscan myoview stress test 02/13/2018:  1. Pharmacologic stress testing was performed with intravenous administration of .4 mg of Lexiscan over a 10-15 seconds infusion. Resting hypertension 180/84 mmHg. Exercise capacity not assessed. Stress symptoms included dyspnea, dizziness. Stress EKG is non diagnostic for ischemia as it is a pharmacologic stress.  2. The overall quality of the study is  excellent. There is no evidence of abnormal lung activity. Stress and rest SPECT images demonstrate homogeneous tracer distribution throughout the myocardium. Gated SPECT imaging reveals normal myocardial thickening and wall motion. The left ventricular ejection fraction was normal (67%).  3. Low risk study. Overall Impression: Poor exercise capacity. Inadequate assessment for inotropic competense, limited due to poor functional capacity.   Recoomendation: Consider Holter/event monitor if high clinical suspicion for high grade AV block . Continue primary/secondary prevention.  Recent labs: Results for ALZINA, GOLDA (MRN [id number]) as of 02/24/2019 11:48  Ref. Range 02/18/2019 04:52 02/18/2019 04:52  Sodium Latest Ref Range: 135 - 145 mmol/L 129 (L)   Potassium Latest Ref Range:  3.5 - 5.1 mmol/L 4.1   Chloride Latest Ref Range: 98 - 111 mmol/L 88 (L)   CO2 Latest Ref Range: 22 - 32 mmol/L 25   Glucose Latest Ref Range: 70 - 99 mg/dL 126 (H)   BUN Latest Ref Range: 8 - 23 mg/dL 61 (H)   Creatinine Latest Ref Range: 0.44 - 1.00 mg/dL 3.16 (H)   Calcium Latest Ref Range: 8.9 - 10.3 mg/dL 10.3   Anion gap Latest Ref Range: 5 - 15  16 (H)   Phosphorus Latest Ref Range: 2.5 - 4.6 mg/dL 3.0   Alkaline Phosphatase Latest Ref Range: 38 - 126 U/L  69  Albumin Latest Ref Range: 3.5 - 5.0 g/dL 4.1 4.1  AST Latest Ref Range: 15 - 41 U/L  16  ALT Latest Ref Range: 0 - 44 U/L  7  Total Protein Latest Ref Range: 6.5 - 8.1 g/dL  6.7  Bilirubin, Direct Latest Ref Range: 0.0 - 0.2 mg/dL  0.2  Indirect Bilirubin Latest Ref Range: 0.3 - 0.9 mg/dL  0.2 (L)  Total Bilirubin Latest Ref Range: 0.3 - 1.2 mg/dL  0.4  GFR, Est Non African American Latest Ref Range: >60 mL/min 14 (L)   GFR, Est African American Latest Ref Range: >60 mL/min 16 (L)    Results for RAVENNE, WAYMENT (MRN [id number]) as of 02/24/2019 11:48  Ref. Range 02/16/2019 08:14  WBC Latest Ref Range: 4.0 - 10.5 K/uL 7.7  RBC Latest Ref  Range: 3.87 - 5.11 MIL/uL 2.79 (L)  Hemoglobin Latest Ref Range: 12.0 - 15.0 g/dL 8.2 (L)  HCT Latest Ref Range: 36.0 - 46.0 % 24.8 (L)  MCV Latest Ref Range: 80.0 - 100.0 fL 88.9  MCH Latest Ref Range: 26.0 - 34.0 pg 29.4  MCHC Latest Ref Range: 30.0 - 36.0 g/dL 33.1  RDW Latest Ref Range: 11.5 - 15.5 % 13.9  Platelets Latest Ref Range: 150 - 400 K/uL 240  nRBC Latest Ref Range: 0.0 - 0.2 % 0.0    Review of Systems  Constitution: Negative for decreased appetite, malaise/fatigue, weight gain and weight loss.  HENT: Negative for congestion.   Eyes: Negative for visual disturbance.  Cardiovascular: Positive for leg swelling. Negative for chest pain, dyspnea on exertion, palpitations and syncope.  Respiratory: Positive for shortness of breath (Stable).   Endocrine: Negative for cold intolerance.  Hematologic/Lymphatic: Does not bruise/bleed easily.  Skin: Negative for itching and rash.  Musculoskeletal: Negative for myalgias.  Gastrointestinal: Negative for abdominal pain, nausea and vomiting.  Genitourinary: Negative for dysuria.  Neurological: Negative for dizziness and weakness.  Psychiatric/Behavioral: The patient is not nervous/anxious.   All other systems reviewed and are negative.       Vitals:   02/24/19 1125  BP: 120/66  Pulse: 75  SpO2: 98%    Objective:   Physical Exam  Constitutional: She is oriented to person, place, and time. She appears well-developed and well-nourished. No distress.  HENT:  Head: Normocephalic and atraumatic.  Eyes: Pupils are equal, round, and reactive to light. Conjunctivae are normal.  Neck: No JVD present.  Cardiovascular: Normal rate and regular rhythm.  Murmur (II/VI ESM murmur RUSB) heard. Pulmonary/Chest: Effort normal and breath sounds normal. She has no wheezes. She has no rales.  Abdominal: Soft. Bowel sounds are normal. There is no rebound.  Musculoskeletal:        General: Edema (1-2+ b/l leg edema. Dark discoloration)  present.  Lymphadenopathy:    She has no cervical adenopathy.  Neurological:  She is alert and oriented to person, place, and time. No cranial nerve deficit.  Skin: Skin is warm and dry.  Psychiatric: She has a normal mood and affect.  Nursing note and vitals reviewed.         Assessment & Recommendations:   73 y/o Caucasian female with hypertension, CKD IV, type 2 DM, hyperlipidemia, s/p Medtronic dual chamber pacemaker for symptomatic high grade AV block, hypothyroidism, anemia, osteopenia, venous insufficiency.  1. Chronic heart failure with preserved ejection fraction (HCC) Stable due to diastolic dysfunction. Amyloidosis should be considered in the differential. There is no M spike on immune electrophoresis, which rules out AL amyloidosis. ATTR amyloidosis is still possible. It may be reasonable to consider PYP study outpatient when elective testing resumes.  I have not made any change to her diuretic regimen today. No significant LVOT obstruction seen on recent echocardiogram.   2. CKD (chronic kidney disease), stage IV ([place]) Stage IV. Follow up with nephrologist Dr. Johnney Ou.   3. Bilateral leg edema Recommend compression stockings.   Follow up in 8-12 weeks.   Nigel Mormon, MD [facility] Pager: [phone] Office: [phone] If no answer Cell [phone]

## 2019-02-25 ENCOUNTER — Encounter: Payer: Self-pay | Admitting: Cardiology

## 2019-02-25 DIAGNOSIS — R600 Localized edema: Secondary | ICD-10-CM | POA: Insufficient documentation

## 2019-03-05 ENCOUNTER — Ambulatory Visit: Payer: Medicare Other | Admitting: Gastroenterology

## 2019-03-05 ENCOUNTER — Other Ambulatory Visit: Payer: Self-pay | Admitting: Physical Medicine and Rehabilitation

## 2019-03-05 NOTE — Telephone Encounter (Signed)
Send refill request to primary MD

## 2019-04-06 ENCOUNTER — Other Ambulatory Visit (HOSPITAL_COMMUNITY): Payer: Self-pay | Admitting: Urology

## 2019-04-06 DIAGNOSIS — N130 Hydronephrosis with ureteropelvic junction obstruction: Secondary | ICD-10-CM

## 2019-04-08 ENCOUNTER — Other Ambulatory Visit: Payer: Self-pay | Admitting: Student

## 2019-04-09 ENCOUNTER — Inpatient Hospital Stay (HOSPITAL_COMMUNITY)
Admission: RE | Admit: 2019-04-09 | Discharge: 2019-04-21 | DRG: 683 | Disposition: A | Discharge status: Rehabilitation Facility | Payer: Medicare Other | Source: Ambulatory Visit | Attending: Internal Medicine | Admitting: Internal Medicine

## 2019-04-09 ENCOUNTER — Encounter (HOSPITAL_COMMUNITY): Payer: Self-pay

## 2019-04-09 ENCOUNTER — Ambulatory Visit (HOSPITAL_COMMUNITY)
Admission: RE | Admit: 2019-04-09 | Discharge: 2019-04-09 | Disposition: A | Discharge status: Home / Self Care | Payer: Medicare Other | Source: Ambulatory Visit | Attending: Urology | Admitting: Urology

## 2019-04-09 ENCOUNTER — Other Ambulatory Visit: Payer: Self-pay

## 2019-04-09 DIAGNOSIS — I872 Venous insufficiency (chronic) (peripheral): Secondary | ICD-10-CM | POA: Diagnosis present

## 2019-04-09 DIAGNOSIS — N132 Hydronephrosis with renal and ureteral calculous obstruction: Secondary | ICD-10-CM | POA: Diagnosis present

## 2019-04-09 DIAGNOSIS — N281 Cyst of kidney, acquired: Secondary | ICD-10-CM | POA: Diagnosis present

## 2019-04-09 DIAGNOSIS — D638 Anemia in other chronic diseases classified elsewhere: Secondary | ICD-10-CM | POA: Diagnosis present

## 2019-04-09 DIAGNOSIS — K219 Gastro-esophageal reflux disease without esophagitis: Secondary | ICD-10-CM | POA: Diagnosis present

## 2019-04-09 DIAGNOSIS — N130 Hydronephrosis with ureteropelvic junction obstruction: Secondary | ICD-10-CM | POA: Diagnosis not present

## 2019-04-09 DIAGNOSIS — M109 Gout, unspecified: Secondary | ICD-10-CM | POA: Diagnosis present

## 2019-04-09 DIAGNOSIS — N184 Chronic kidney disease, stage 4 (severe): Secondary | ICD-10-CM | POA: Diagnosis present

## 2019-04-09 DIAGNOSIS — E875 Hyperkalemia: Secondary | ICD-10-CM | POA: Diagnosis not present

## 2019-04-09 DIAGNOSIS — Z7951 Long term (current) use of inhaled steroids: Secondary | ICD-10-CM

## 2019-04-09 DIAGNOSIS — E1169 Type 2 diabetes mellitus with other specified complication: Secondary | ICD-10-CM | POA: Diagnosis present

## 2019-04-09 DIAGNOSIS — Z885 Allergy status to narcotic agent status: Secondary | ICD-10-CM

## 2019-04-09 DIAGNOSIS — E861 Hypovolemia: Secondary | ICD-10-CM | POA: Diagnosis not present

## 2019-04-09 DIAGNOSIS — N2581 Secondary hyperparathyroidism of renal origin: Secondary | ICD-10-CM | POA: Diagnosis present

## 2019-04-09 DIAGNOSIS — I959 Hypotension, unspecified: Secondary | ICD-10-CM | POA: Diagnosis not present

## 2019-04-09 DIAGNOSIS — E872 Acidosis: Secondary | ICD-10-CM | POA: Diagnosis present

## 2019-04-09 DIAGNOSIS — I130 Hypertensive heart and chronic kidney disease with heart failure and stage 1 through stage 4 chronic kidney disease, or unspecified chronic kidney disease: Secondary | ICD-10-CM | POA: Diagnosis present

## 2019-04-09 DIAGNOSIS — T40605A Adverse effect of unspecified narcotics, initial encounter: Secondary | ICD-10-CM | POA: Diagnosis not present

## 2019-04-09 DIAGNOSIS — N1330 Unspecified hydronephrosis: Secondary | ICD-10-CM

## 2019-04-09 DIAGNOSIS — G8929 Other chronic pain: Secondary | ICD-10-CM | POA: Diagnosis present

## 2019-04-09 DIAGNOSIS — Z87442 Personal history of urinary calculi: Secondary | ICD-10-CM

## 2019-04-09 DIAGNOSIS — E039 Hypothyroidism, unspecified: Secondary | ICD-10-CM | POA: Diagnosis present

## 2019-04-09 DIAGNOSIS — E7800 Pure hypercholesterolemia, unspecified: Secondary | ICD-10-CM | POA: Diagnosis present

## 2019-04-09 DIAGNOSIS — N179 Acute kidney failure, unspecified: Principal | ICD-10-CM | POA: Diagnosis present

## 2019-04-09 DIAGNOSIS — Z888 Allergy status to other drugs, medicaments and biological substances status: Secondary | ICD-10-CM

## 2019-04-09 DIAGNOSIS — N3010 Interstitial cystitis (chronic) without hematuria: Secondary | ICD-10-CM

## 2019-04-09 DIAGNOSIS — Z79890 Hormone replacement therapy (postmenopausal): Secondary | ICD-10-CM

## 2019-04-09 DIAGNOSIS — E876 Hypokalemia: Secondary | ICD-10-CM | POA: Diagnosis not present

## 2019-04-09 DIAGNOSIS — Z515 Encounter for palliative care: Secondary | ICD-10-CM

## 2019-04-09 DIAGNOSIS — E119 Type 2 diabetes mellitus without complications: Secondary | ICD-10-CM

## 2019-04-09 DIAGNOSIS — Z79899 Other long term (current) drug therapy: Secondary | ICD-10-CM

## 2019-04-09 DIAGNOSIS — M1990 Unspecified osteoarthritis, unspecified site: Secondary | ICD-10-CM | POA: Diagnosis present

## 2019-04-09 DIAGNOSIS — R300 Dysuria: Secondary | ICD-10-CM

## 2019-04-09 DIAGNOSIS — I442 Atrioventricular block, complete: Secondary | ICD-10-CM | POA: Diagnosis present

## 2019-04-09 DIAGNOSIS — E785 Hyperlipidemia, unspecified: Secondary | ICD-10-CM | POA: Diagnosis present

## 2019-04-09 DIAGNOSIS — E871 Hypo-osmolality and hyponatremia: Secondary | ICD-10-CM | POA: Diagnosis present

## 2019-04-09 DIAGNOSIS — M8580 Other specified disorders of bone density and structure, unspecified site: Secondary | ICD-10-CM | POA: Diagnosis present

## 2019-04-09 DIAGNOSIS — Z79891 Long term (current) use of opiate analgesic: Secondary | ICD-10-CM

## 2019-04-09 DIAGNOSIS — Z833 Family history of diabetes mellitus: Secondary | ICD-10-CM

## 2019-04-09 DIAGNOSIS — Z8249 Family history of ischemic heart disease and other diseases of the circulatory system: Secondary | ICD-10-CM

## 2019-04-09 DIAGNOSIS — Z950 Presence of cardiac pacemaker: Secondary | ICD-10-CM

## 2019-04-09 DIAGNOSIS — Z800 Family history of malignant neoplasm of digestive organs: Secondary | ICD-10-CM

## 2019-04-09 DIAGNOSIS — M25559 Pain in unspecified hip: Secondary | ICD-10-CM

## 2019-04-09 DIAGNOSIS — I5032 Chronic diastolic (congestive) heart failure: Secondary | ICD-10-CM | POA: Diagnosis present

## 2019-04-09 DIAGNOSIS — Z7952 Long term (current) use of systemic steroids: Secondary | ICD-10-CM

## 2019-04-09 DIAGNOSIS — K5903 Drug induced constipation: Secondary | ICD-10-CM | POA: Diagnosis not present

## 2019-04-09 DIAGNOSIS — E1122 Type 2 diabetes mellitus with diabetic chronic kidney disease: Secondary | ICD-10-CM | POA: Diagnosis present

## 2019-04-09 DIAGNOSIS — I441 Atrioventricular block, second degree: Secondary | ICD-10-CM | POA: Diagnosis present

## 2019-04-09 DIAGNOSIS — N3289 Other specified disorders of bladder: Secondary | ICD-10-CM | POA: Diagnosis present

## 2019-04-09 DIAGNOSIS — I10 Essential (primary) hypertension: Secondary | ICD-10-CM

## 2019-04-09 HISTORY — PX: IR NEPHROSTOMY PLACEMENT RIGHT: IMG6064

## 2019-04-09 LAB — GLUCOSE, CAPILLARY
Glucose-Capillary: 106 mg/dL — ABNORMAL HIGH (ref 70–99)
Glucose-Capillary: 99 mg/dL (ref 70–99)
Glucose-Capillary: 136 mg/dL — ABNORMAL HIGH (ref 70–99)

## 2019-04-09 LAB — PROTIME-INR
INR: 1 (ref 0.8–1.2)
Prothrombin Time: 13.3 seconds (ref 11.4–15.2)

## 2019-04-09 LAB — BASIC METABOLIC PANEL
Anion gap: 18 — ABNORMAL HIGH (ref 5–15)
BUN: 135 mg/dL — ABNORMAL HIGH (ref 8–23)
CO2: 9 mmol/L — ABNORMAL LOW (ref 22–32)
Calcium: 9.9 mg/dL (ref 8.9–10.3)
Chloride: 102 mmol/L (ref 98–111)
Creatinine, Ser: 4.6 mg/dL — ABNORMAL HIGH (ref 0.44–1.00)
GFR calc Af Amer: 10 mL/min — ABNORMAL LOW (ref >60)
GFR calc non Af Amer: 9 mL/min — ABNORMAL LOW (ref >60)
Glucose, Bld: 108 mg/dL — ABNORMAL HIGH (ref 70–99)
Potassium: 5.2 mmol/L — ABNORMAL HIGH (ref 3.5–5.1)
Sodium: 129 mmol/L — ABNORMAL LOW (ref 135–145)

## 2019-04-09 LAB — CBC
HCT: 30.8 % — ABNORMAL LOW (ref 36.0–46.0)
Hemoglobin: 10.3 g/dL — ABNORMAL LOW (ref 12.0–15.0)
MCH: 28.9 pg (ref 26.0–34.0)
MCHC: 33.4 g/dL (ref 30.0–36.0)
MCV: 86.5 fL (ref 80.0–100.0)
Platelets: 292 10*3/uL (ref 150–400)
RBC: 3.56 MIL/uL — ABNORMAL LOW (ref 3.87–5.11)
RDW: 14.8 % (ref 11.5–15.5)
WBC: 8.5 10*3/uL (ref 4.0–10.5)
nRBC: 0 % (ref 0.0–0.2)

## 2019-04-09 LAB — APTT: aPTT: 30 seconds (ref 24–36)

## 2019-04-09 MED ORDER — GABAPENTIN 300 MG PO CAPS: 300.0000 mg | ORAL_CAPSULE | ORAL | Status: DC

## 2019-04-09 MED ORDER — VITAMIN B-12 1000 MCG PO TABS
1000.0000 ug | ORAL_TABLET | Freq: Every day | ORAL | Status: DC
  Administered 2019-04-10 – 2019-04-21 (×12): 1000 ug via ORAL
  Filled 2019-04-09 (×12): qty 1

## 2019-04-09 MED ORDER — INSULIN ASPART 100 UNIT/ML ~~LOC~~ SOLN
0.0000 [IU] | Freq: Three times a day (TID) | SUBCUTANEOUS | Status: DC
  Administered 2019-04-10: 1 [IU] via SUBCUTANEOUS
  Administered 2019-04-10 (×2): 2 [IU] via SUBCUTANEOUS
  Administered 2019-04-13 (×2): 1 [IU] via SUBCUTANEOUS
  Administered 2019-04-14: 2 [IU] via SUBCUTANEOUS
  Administered 2019-04-14 – 2019-04-17 (×4): 1 [IU] via SUBCUTANEOUS
  Administered 2019-04-17: 2 [IU] via SUBCUTANEOUS
  Administered 2019-04-20 (×3): 1 [IU] via SUBCUTANEOUS

## 2019-04-09 MED ORDER — HYDROMORPHONE HCL 1 MG/ML IJ SOLN
0.5000 mg | Freq: Once | INTRAMUSCULAR | Status: AC
  Administered 2019-04-09: 0.5 mg via INTRAVENOUS

## 2019-04-09 MED ORDER — FENTANYL CITRATE (PF) 100 MCG/2ML IJ SOLN
INTRAMUSCULAR | Status: AC | PRN
  Administered 2019-04-09: 50 ug via INTRAVENOUS

## 2019-04-09 MED ORDER — HYDROMORPHONE HCL 1 MG/ML IJ SOLN
INTRAMUSCULAR | Status: AC
  Filled 2019-04-09: qty 1

## 2019-04-09 MED ORDER — LIDOCAINE HCL 1 % IJ SOLN
INTRAMUSCULAR | Status: AC
  Administered 2019-04-09: 16:00:00
  Filled 2019-04-09: qty 20

## 2019-04-09 MED ORDER — FERROUS SULFATE 325 (65 FE) MG PO TABS
325.0000 mg | ORAL_TABLET | Freq: Two times a day (BID) | ORAL | Status: DC
  Administered 2019-04-10 – 2019-04-21 (×23): 325 mg via ORAL
  Filled 2019-04-09 (×23): qty 1

## 2019-04-09 MED ORDER — ACETAMINOPHEN 325 MG PO TABS
650.0000 mg | ORAL_TABLET | Freq: Four times a day (QID) | ORAL | Status: DC | PRN
  Administered 2019-04-11 – 2019-04-20 (×4): 650 mg via ORAL
  Filled 2019-04-09 (×5): qty 2

## 2019-04-09 MED ORDER — OXYCODONE-ACETAMINOPHEN 5-325 MG PO TABS
1.0000 | ORAL_TABLET | ORAL | Status: DC | PRN
  Administered 2019-04-09 – 2019-04-10 (×4): 1 via ORAL
  Filled 2019-04-09 (×4): qty 1

## 2019-04-09 MED ORDER — IOHEXOL 300 MG/ML  SOLN
50.0000 mL | Freq: Once | INTRAMUSCULAR | Status: AC | PRN
  Administered 2019-04-09: 20 mL

## 2019-04-09 MED ORDER — SODIUM CHLORIDE 0.9 % IV SOLN
INTRAVENOUS | Status: DC
  Administered 2019-04-09 – 2019-04-11 (×4): via INTRAVENOUS

## 2019-04-09 MED ORDER — SODIUM BICARBONATE 650 MG PO TABS
1300.0000 mg | ORAL_TABLET | Freq: Two times a day (BID) | ORAL | Status: DC
  Administered 2019-04-09 – 2019-04-12 (×6): 1300 mg via ORAL
  Filled 2019-04-09 (×6): qty 2

## 2019-04-09 MED ORDER — HYDROMORPHONE HCL 1 MG/ML IJ SOLN
INTRAMUSCULAR | Status: AC
  Administered 2019-04-09: 0.5 mg via INTRAVENOUS
  Filled 2019-04-09: qty 1

## 2019-04-09 MED ORDER — FENTANYL CITRATE (PF) 100 MCG/2ML IJ SOLN
INTRAMUSCULAR | Status: AC
  Administered 2019-04-09: 16:00:00
  Filled 2019-04-09: qty 4

## 2019-04-09 MED ORDER — CIPROFLOXACIN IN D5W 400 MG/200ML IV SOLN
400.0000 mg | INTRAVENOUS | Status: AC
  Administered 2019-04-09: 10:00:00 400 mg via INTRAVENOUS

## 2019-04-09 MED ORDER — ACETAMINOPHEN 650 MG RE SUPP
650.0000 mg | Freq: Four times a day (QID) | RECTAL | Status: DC | PRN
  Administered 2019-04-15: 650 mg via RECTAL
  Filled 2019-04-09: qty 1

## 2019-04-09 MED ORDER — MIDAZOLAM HCL 2 MG/2ML IJ SOLN
INTRAMUSCULAR | Status: AC
  Administered 2019-04-09: 16:00:00
  Filled 2019-04-09: qty 6

## 2019-04-09 MED ORDER — HYDROMORPHONE HCL 1 MG/ML IJ SOLN
1.0000 mg | INTRAMUSCULAR | Status: DC | PRN
  Administered 2019-04-09 – 2019-04-16 (×18): 1 mg via INTRAVENOUS
  Filled 2019-04-09 (×18): qty 1

## 2019-04-09 MED ORDER — SIMVASTATIN 40 MG PO TABS
40.0000 mg | ORAL_TABLET | Freq: Every day | ORAL | Status: DC
  Administered 2019-04-09 – 2019-04-20 (×12): 40 mg via ORAL
  Filled 2019-04-09 (×12): qty 1

## 2019-04-09 MED ORDER — LEVOFLOXACIN 500 MG PO TABS
250.0000 mg | ORAL_TABLET | Freq: Every day | ORAL | Status: DC
  Administered 2019-04-10 – 2019-04-12 (×3): 250 mg via ORAL
  Filled 2019-04-09 (×3): qty 1

## 2019-04-09 MED ORDER — DARIFENACIN HYDROBROMIDE ER 7.5 MG PO TB24
7.5000 mg | ORAL_TABLET | Freq: Every day | ORAL | Status: DC
  Administered 2019-04-10 – 2019-04-21 (×12): 7.5 mg via ORAL
  Filled 2019-04-09 (×12): qty 1

## 2019-04-09 MED ORDER — DIAZEPAM 5 MG PO TABS: 10.0000 mg | ORAL_TABLET | Freq: Three times a day (TID) | ORAL | Status: DC | PRN

## 2019-04-09 MED ORDER — SODIUM CHLORIDE 0.9 % IV SOLN
INTRAVENOUS | Status: DC
  Administered 2019-04-09: 08:00:00 via INTRAVENOUS

## 2019-04-09 MED ORDER — LEVOTHYROXINE SODIUM 75 MCG PO TABS
75.0000 ug | ORAL_TABLET | Freq: Every day | ORAL | Status: DC
  Administered 2019-04-10 – 2019-04-21 (×12): 75 ug via ORAL
  Filled 2019-04-09 (×12): qty 1

## 2019-04-09 MED ORDER — ONDANSETRON HCL 4 MG/2ML IJ SOLN
4.0000 mg | Freq: Four times a day (QID) | INTRAMUSCULAR | Status: DC | PRN
  Administered 2019-04-09: 4 mg via INTRAVENOUS
  Filled 2019-04-09: qty 2

## 2019-04-09 MED ORDER — PANTOPRAZOLE SODIUM 40 MG PO TBEC
40.0000 mg | DELAYED_RELEASE_TABLET | Freq: Two times a day (BID) | ORAL | Status: DC
  Administered 2019-04-09 – 2019-04-21 (×24): 40 mg via ORAL
  Filled 2019-04-09 (×24): qty 1

## 2019-04-09 MED ORDER — COLCHICINE 0.6 MG PO TABS: 0.6000 mg | ORAL_TABLET | Freq: Every day | ORAL | Status: DC

## 2019-04-09 MED ORDER — MIRABEGRON ER 25 MG PO TB24
50.0000 mg | ORAL_TABLET | Freq: Every day | ORAL | Status: DC
  Administered 2019-04-10 – 2019-04-14 (×5): 50 mg via ORAL
  Filled 2019-04-09 (×5): qty 2

## 2019-04-09 MED ORDER — OXYCODONE-ACETAMINOPHEN 5-325 MG PO TABS
ORAL_TABLET | ORAL | Status: AC
  Filled 2019-04-09: qty 1

## 2019-04-09 MED ORDER — GABAPENTIN 300 MG PO CAPS
300.0000 mg | ORAL_CAPSULE | Freq: Every day | ORAL | Status: DC
  Administered 2019-04-10 – 2019-04-20 (×11): 300 mg via ORAL
  Filled 2019-04-09 (×11): qty 1

## 2019-04-09 MED ORDER — ONDANSETRON HCL 4 MG PO TABS
4.0000 mg | ORAL_TABLET | Freq: Four times a day (QID) | ORAL | Status: DC | PRN
  Administered 2019-04-18: 4 mg via ORAL
  Filled 2019-04-09: qty 1

## 2019-04-09 MED ORDER — FOLIC ACID 1 MG PO TABS
1.0000 mg | ORAL_TABLET | Freq: Every day | ORAL | Status: DC
  Administered 2019-04-10 – 2019-04-21 (×12): 1 mg via ORAL
  Filled 2019-04-09 (×12): qty 1

## 2019-04-09 MED ORDER — MIDAZOLAM HCL 2 MG/2ML IJ SOLN
INTRAMUSCULAR | Status: AC | PRN
  Administered 2019-04-09 (×3): 1 mg via INTRAVENOUS

## 2019-04-09 MED ORDER — ENOXAPARIN SODIUM 30 MG/0.3ML ~~LOC~~ SOLN
30.0000 mg | SUBCUTANEOUS | Status: DC
  Administered 2019-04-09 – 2019-04-13 (×5): 30 mg via SUBCUTANEOUS
  Filled 2019-04-09 (×5): qty 0.3

## 2019-04-09 MED ORDER — CIPROFLOXACIN IN D5W 400 MG/200ML IV SOLN
INTRAVENOUS | Status: AC
  Administered 2019-04-09: 400 mg via INTRAVENOUS
  Filled 2019-04-09: qty 200

## 2019-04-09 NOTE — H&P (Signed)
History and Physical    KAMIE KORBER HUT:[id number] DOB: 1946-08-13 DOA: 04/09/2019  PCP: [name], MD   Patient coming from: Home   Chief Complaint: Right flank pain  HPI: ANNIAH GLICK is a 73 y.o. female with medical history significant of hypertension, chronic kidney disease stage IV, type 2 diabetes mellitus, dyslipidemia, high-grade AV block status post dual-chamber pacemaker, diastolic heart failure, hypothyroidism, anemia, osteopenia, interstitial cystitis and venous insufficiency.  She was recently diagnosed with moderate to marked chronic dilation of the right renal collecting system and Enterobacter/enterococcus urinary tract infection that required hospitalization in March 2020  As an outpatient she was found to have right hydronephrosis due to UPJ obstruction, she did not tolerate a stent placement, she was referred to interventional radiology for a percutaneous nephrostomy tube placement.  After the procedure which was successful patient complaining of right flank pain which has been severe in intensity, 10 out of 10, sharp in nature, no radiation, no improving or worsening factors, no associated nausea, vomiting or diarrhea.  She lives at home and independently.  ED Course: NA, patient was admitted directly from the outpatient interventional radiology suite  Review of Systems:  1. General: No fevers, no chills, no weight gain or weight loss 2. ENT: No runny nose or sore throat, no hearing disturbances 3. Pulmonary: No dyspnea, cough, wheezing, or hemoptysis 4. Cardiovascular: No angina, claudication, lower extremity edema, pnd or orthopnea 5. Gastrointestinal: No nausea or vomiting, no diarrhea or constipation 6. Hematology: No easy bruisability or frequent infections 7. Urology: Patient has had persistent dysuria, treated as an outpatient by urology, with no significant improvement 8. Dermatology: No rashes. 9. Neurology: No seizures or paresthesias 10.  Musculoskeletal: No joint pain or deformities  Past Medical History:  Diagnosis Date  . Arthritis    "knees, thumbs" (03/25/2018)  . CKD (chronic kidney disease), stage IV ([place])   . Diet-controlled diabetes mellitus ([place])   . GERD (gastroesophageal reflux disease)   . Gout    "on daily RX" (03/25/2018)  . Heart murmur   . High cholesterol   . Hypertension   . Hypothyroidism   . Iron deficiency anemia    "had to get an iron infusion"  . Migraine    "used to have them growing up" (03/25/2018)  . Presence of permanent cardiac pacemaker 03/25/2018    Past Surgical History:  Procedure Laterality Date  . APPENDECTOMY    . BIOPSY  05/19/2018   Procedure: BIOPSY;  Surgeon: Ladene Artist, MD;  Location: [place];  Service: Endoscopy;;  . Consuela Mimes W/ RETROGRADES Bilateral 01/14/2019   Procedure: CYSTOSCOPY WITH RETROGRADE PYELOGRAM BILATERAL HYDRODISTENTION;  Surgeon: Ardis Hughs, MD;  Location: [facility];  Service: Urology;  Laterality: Bilateral;  . ESOPHAGOGASTRODUODENOSCOPY N/A 05/19/2018   Procedure: ESOPHAGOGASTRODUODENOSCOPY (EGD);  Surgeon: Ladene Artist, MD;  Location: [facility] ENDOSCOPY;  Service: Endoscopy;  Laterality: N/A;  . ESOPHAGOGASTRODUODENOSCOPY (EGD) WITH PROPOFOL N/A 01/28/2019   Procedure: ESOPHAGOGASTRODUODENOSCOPY (EGD) WITH PROPOFOL;  Surgeon: Rush Landmark Telford Nab., MD;  Location: [place];  Service: Gastroenterology;  Laterality: N/A;  . INSERT / REPLACE / REMOVE PACEMAKER  03/25/2018  . IR FLUORO GUIDE CV LINE RIGHT  05/18/2018  . IR LUMBAR DISC ASPIRATION W/IMG GUIDE  05/15/2018  . IR NEPHROSTOMY PLACEMENT RIGHT  04/09/2019  . IR REMOVAL TUN CV CATH W/O FL  07/23/2018  . IR US GUIDE VASC ACCESS RIGHT  05/18/2018  . KNEE ARTHROSCOPY Bilateral   . PACEMAKER IMPLANT N/A 03/25/2018  Procedure: PACEMAKER IMPLANT;  Surgeon: Evans Lance, MD;  Location: [place] [facility];  Service: Cardiovascular;  Laterality: N/A;  . PACEMAKER PLACEMENT Left 03/2018  .  POLYPECTOMY  01/28/2019   Procedure: POLYPECTOMY;  Surgeon: Mansouraty, Telford Nab., MD;  Location: [place];  Service: Gastroenterology;;  . RIGHT HEART CATH N/A 03/20/2018   Procedure: RIGHT HEART CATH;  Surgeon: Nigel Mormon, MD;  Location: [place] [facility];  Service: Cardiovascular;  Laterality: N/A;  . TEE WITHOUT CARDIOVERSION N/A 03/20/2018   Procedure: TRANSESOPHAGEAL ECHOCARDIOGRAM (TEE);  Surgeon: Nigel Mormon, MD;  Location[facility] ENDOSCOPY;  Service: Cardiovascular;  Laterality: N/A;  . TONSILLECTOMY    . URETEROSCOPY WITH HOLMIUM LASER LITHOTRIPSY Right 01/14/2019   Procedure: URETEROSCOPY WITH HOLMIUM LASER LITHOTRIPSY BLADDER BIOPSIES RIGHT STENT PLACEMENT;  Surgeon: Ardis Hughs, MD;  Location: [facility];  Service: Urology;  Laterality: Right;     reports that she has never smoked. She has never used smokeless tobacco. She reports previous alcohol use. She reports that she does not use drugs.  Allergies  Allergen Reactions  . Baclofen Other (See Comments)    somnolence  . Codeine Other (See Comments)    Increases Pain and couldn't sleep    Family History  Problem Relation Age of Onset  . Hypertension Mother   . Diabetes Mellitus II Father   . Heart disease Father   . Heart attack Father   . Gastric cancer Brother   . Diabetes Mellitus II Brother   . Stomach cancer Brother      Prior to Admission medications   Medication Sig Start Date End Date Taking? Authorizing Provider  Ascorbic Acid (VITAMIN C PO) Take 1 tablet by mouth 2 (two) times daily.   Yes [provider]  Biotin 5 MG CAPS Take 1 capsule (5 mg total) by mouth daily. 01/14/19  Yes Ardis Hughs, MD  cetirizine (ZYRTEC) 10 MG tablet Take 10 mg by mouth at bedtime.    Yes [provider]  diazepam (VALIUM) 10 MG tablet 10 mg See admin instructions. Insert 10 mg vaginally at bedtime   Yes [provider]  ferrous sulfate 325 (65 FE) MG tablet Take 1  tablet (325 mg total) by mouth 2 (two) times daily with a meal. 06/22/18  Yes Love, Ivan Anchors, PA-C  folic acid (FOLVITE) 1 MG tablet Take 1 tablet (1 mg total) by mouth daily. 06/22/18  Yes Love, Ivan Anchors, PA-C  furosemide (LASIX) 40 MG tablet Take 2 tablets (80 mg total) by mouth 2 (two) times daily. 02/19/19 02/19/20 Yes Georgette Shell, MD  gabapentin (NEURONTIN) 300 MG capsule Take 300 mg by mouth See admin instructions. Take one capsule on day 1, then 2 capsules day 2 then take 3 capsules thereafter 02/09/19  Yes [provider]  levofloxacin (LEVAQUIN) 250 MG tablet Take 250 mg by mouth daily. 02/22/19  Yes [provider]  levothyroxine (SYNTHROID, LEVOTHROID) 75 MCG tablet Take 75 mcg by mouth daily before breakfast.   Yes [provider]  mirabegron ER (MYRBETRIQ) 50 MG TB24 tablet Take 50 mg by mouth daily.   Yes [provider]  Multiple Vitamin (MULTI-VITAMIN PO) Take 1 tablet by mouth daily.   Yes [provider]  pantoprazole (PROTONIX) 40 MG tablet Take 1 tablet (40 mg total) by mouth 2 (two) times daily for 30 days. (take once daily after 1 month) 01/29/19 04/09/19 Yes Elodia Florence., MD  potassium chloride (K-DUR) 10  MEQ tablet Take 1 tablet (10 mEq total) by mouth 2 (two) times daily. 02/19/19  Yes Georgette Shell, MD  predniSONE (DELTASONE) 10 MG tablet Take 3 tabs daily for 3 days then 2 tabs daily for 3 days then 1 tab daily till done 02/19/19  Yes Georgette Shell, MD  simvastatin (ZOCOR) 40 MG tablet Take 0.5 tablets (20 mg total) by mouth at bedtime. Patient taking differently: Take 40 mg by mouth at bedtime.  06/22/18  Yes Love, Ivan Anchors, PA-C  sodium bicarbonate 650 MG tablet Take 2 tablets (1,300 mg total) by mouth 2 (two) times daily. 02/19/19  Yes Georgette Shell, MD  solifenacin (VESICARE) 5 MG tablet Take 5 mg by mouth daily. 02/04/19  Yes [provider]  vitamin B-12 (CYANOCOBALAMIN) 1000 MCG tablet Take  1 tablet (1,000 mcg total) by mouth daily. 06/22/18  Yes Love, Ivan Anchors, PA-C  acetaminophen (TYLENOL) 325 MG tablet Take 650 mg by mouth as needed.    [provider]  colchicine 0.6 MG tablet Take 1 tablet (0.6 mg total) by mouth daily. 02/19/19   Georgette Shell, MD  fluticasone [facility]) 50 MCG/ACT nasal spray Place 2 sprays into both nostrils daily as needed for allergies or rhinitis.    [provider]  Hydrocortisone (GERHARDT'S BUTT CREAM) CREA Apply 1 application topically 4 (four) times daily. 02/19/19   Georgette Shell, MD  oxyCODONE-acetaminophen (PERCOCET/ROXICET) 5-325 MG tablet Take 1 tablet by mouth 2 (two) times daily. 02/09/19   [provider]    Physical Exam: Vitals:   04/09/19 1230 04/09/19 1300 04/09/19 1345 04/09/19 1400  BP: 140/86  134/70   Pulse: 71 82 86 91  Resp: 14  16   Temp:      TempSrc:      SpO2: 100% 99% 98% 99%    Vitals:   04/09/19 1230 04/09/19 1300 04/09/19 1345 04/09/19 1400  BP: 140/86  134/70   Pulse: 71 82 86 91  Resp: 14  16   Temp:      TempSrc:      SpO2: 100% 99% 98% 99%   General: deconditioned and ill looking appearing. Neurology: Awake and alert, non focal Head and Neck. Head normocephalic. Neck supple with no adenopathy or thyromegaly.   E ENT: mild pallor, no icterus, oral mucosa moist Cardiovascular: No JVD. S1-S2 present, rhythmic, no gallops, or rubs, positive 3.6 sysotolic murmur at the apex. ++ non pitting lower extremity edema. Pulmonary: positive breath sounds bilaterally, adequate air movement, no wheezing, rhonchi or rales. Gastrointestinal. Abdomen with no organomegaly, non tender, no rebound or guarding Skin. No rashes Musculoskeletal: no joint deformities Right nephrostomy tube in place, no site ecchymosis or erythema, urine is clear.   Labs on Admission: I have personally reviewed following labs and imaging studies  CBC: Recent Labs  Lab 04/09/19 0751  WBC 8.5  HGB 10.3*   HCT 30.8*  MCV 86.5  PLT 073   Basic Metabolic Panel: No results for input(s): NA, K, CL, CO2, GLUCOSE, BUN, CREATININE, CALCIUM, MG, PHOS in the last 168 hours. GFR: CrCl cannot be calculated (Patient's most recent lab result is older than the maximum 21 days allowed.). Liver Function Tests: No results for input(s): AST, ALT, ALKPHOS, BILITOT, PROT, ALBUMIN in the last 168 hours. No results for input(s): LIPASE, AMYLASE in the last 168 hours. No results for input(s): AMMONIA in the last 168 hours. Coagulation Profile: Recent Labs  Lab 04/09/19 0751  INR 1.0  Cardiac Enzymes: No results for input(s): CKTOTAL, CKMB, CKMBINDEX, TROPONINI in the last 168 hours. BNP (last 3 results) No results for input(s): PROBNP in the last 8760 hours. HbA1C: No results for input(s): HGBA1C in the last 72 hours. CBG: Recent Labs  Lab 04/09/19 0757  GLUCAP 106*   Lipid Profile: No results for input(s): CHOL, HDL, LDLCALC, TRIG, CHOLHDL, LDLDIRECT in the last 72 hours. Thyroid Function Tests: No results for input(s): TSH, T4TOTAL, FREET4, T3FREE, THYROIDAB in the last 72 hours. Anemia Panel: No results for input(s): VITAMINB12, FOLATE, FERRITIN, TIBC, IRON, RETICCTPCT in the last 72 hours. Urine analysis:    Component Value Date/Time   COLORURINE YELLOW 02/10/2019 1726   APPEARANCEUR TURBID (A) 02/10/2019 1726   LABSPEC 1.008 02/10/2019 1726   PHURINE 5.0 02/10/2019 1726   GLUCOSEU NEGATIVE 02/10/2019 1726   HGBUR SMALL (A) 02/10/2019 1726   BILIRUBINUR NEGATIVE 02/10/2019 1726   [place] 02/10/2019 1726   PROTEINUR 30 (A) 02/10/2019 1726   NITRITE NEGATIVE 02/10/2019 1726   LEUKOCYTESUR LARGE (A) 02/10/2019 1726    Radiological Exams on Admission: Ir Nephrostomy Placement Right  Result Date: 04/09/2019 INDICATION: 73 year old female with a history of right-sided stone disease status post lithotripsy with recurrent symptomatic hydronephrosis following ureteral stent  removal. She presents for percutaneous nephrostomy tube placement. EXAM: IR NEPHROSTOMY PLACEMENT RIGHT COMPARISON:  CT abdomen/pelvis 02/10/2019; renal ultrasound 03/19/2019 MEDICATIONS: 400 mg ciprofloxacin; The antibiotic was administered in an appropriate time frame prior to skin puncture. ANESTHESIA/SEDATION: Fentanyl 100 mcg IV; Versed 3 mg IV Moderate Sedation Time:  30 minutes The patient was continuously monitored during the procedure by the interventional radiology nurse under my direct supervision. CONTRAST:  20 mL Omnipaque 300-administered into the collecting system(s) FLUOROSCOPY TIME:  Fluoroscopy Time: 1 minutes 42 seconds (9 mGy). COMPLICATIONS: None immediate. TECHNIQUE: The procedure, risks, benefits, and alternatives were explained to the patient. Questions regarding the procedure were encouraged and answered. The patient understands and consents to the procedure. The right flank was prepped with chlorhexidine in a sterile fashion, and a sterile drape was applied covering the operative field. A sterile gown and sterile gloves were used for the procedure. Local anesthesia was provided with 1% Lidocaine. The right flank was interrogated with ultrasound and the left kidney identified. The kidney is hydronephrotic. A suitable access site on the skin overlying the lower pole, posterior calix was identified. After local mg anesthesia was achieved, a small skin nick was made with an 11 blade scalpel. A 21 gauge Accustick needle was then advanced under direct sonographic guidance into the lower pole of the right kidney. A 0.018 inch wire was advanced under fluoroscopic guidance into the left renal collecting system. The Accustick sheath was then advanced over the wire and a 0.018 system exchanged for a 0.035 system. Gentle hand injection of contrast material confirms placement of the sheath within the renal collecting system. There is moderate hydronephrosis. The tract from the scan into the renal  collecting system was then dilated serially to 10-French. A 10-French Cook all-purpose drain was then placed and positioned under fluoroscopic guidance. The locking loop is well formed within the left renal pelvis. The catheter was secured to the skin with 2-0 Prolene and a sterile bandage was placed. Catheter was left to gravity bag drainage. IMPRESSION: Successful placement of a right 10 French percutaneous nephrostomy tube. The urine was opaque and milky in color likely representing milk of calcium. PLAN: Return to interventional Radiology in 6 weeks for tube check and change.  Signed, Criselda Peaches, MD, [facility] Radiology Electronically Signed   By: Jacqulynn Cadet M.D.   On: 04/09/2019 11:24    EKG: Independently reviewed. NA  Assessment/Plan Principal Problem:   Hydronephrosis Active Problems:   Mobitz type 2 second degree AV block   CKD (chronic kidney disease), stage IV ([facility])   Diet-controlled diabetes mellitus ([facility])   Essential hypertension   Interstitial cystitis  73 year old female who presents with severe right flank pain post nephrostomy tube insertion, she does have chronic pelvic pain related to interstitial cystitis.  On her initial physical examination blood pressure is 134/69, heart rate 112, respiratory 24, oxygen saturation 98%, her lungs are clear to auscultation bilaterally she does have systolic murmur, abdomen is soft nontender, nonpitting lower extremity edema.  Right nephrostomy tube in place.  White cell count is 8.5, hemoglobin 10.3, hematocrit is 30.8, platelets 292.  Her chemistry from March 2020 had a creatinine of 3.16  1.  Right sided hydronephrosis status post nephrostomy tube, postprocedure intractable pain.  Patient currently with severe pain of the right flank, on examination there is no ecchymosis at the entry of the nephrostomy tube, the urine is coming clear.  We will continue pain control with IV  morphine, avoid NSAIDs due to chronic kidney disease.  Will add gentle IV fluids.  Continue levofloxacin per IR recommendations.   2.  Chronic diastolic heart failure.  Stable with no signs of exacerbation.  Will continue gentle hydration with normal saline.  3.  Chronic kidney disease stage IV.  Avoid nephrotoxic agents or hypotension, check chemistry on admission.  Continue gentle hydration with saline.  4.  Hypertension.  Continue blood pressure monitoring.  Will hold on furosemide for now.  5.  Type II that is mellitus.  Patient is dIet-controlled at home, will add insulin sliding scale for glucose coverage and monitoring.  6.  Hypothyroidism.  Continue levothyroxine.    DVT prophylaxis:  Enoxaparin  Code Status: full  Family Communication: no family at the bedside   Disposition Plan: med surg   Consults called: none   Admission status: observation.     Yurem Viner Gerome Apley MD [facility]   04/09/2019, 2:22 PM

## 2019-04-09 NOTE — Progress Notes (Addendum)
Patient with expected post-procedure pain and soreness in her right flank after PCN placement today, however she continues to complain of perineal, vaginal pain and pressure unrelated to drain.  Continues to need IV pain medication in recovery.  Her vital signs remain stable.   Afebrile.  Hospitalist service paged for possible admission.   Brynda Greathouse, MS RD PA-C

## 2019-04-09 NOTE — Discharge Instructions (Signed)
See attached sheet for next follow up appt to change the size of the neph tube. Instructions for that appt. are on that sheet.   There is a good youtube video called "How To Take Care Of Your Percutaneous Nephrostomy Tube" by [facility] that is a great resource on how to take care of your nephrostomy tube.    Percutaneous Nephrostomy Home Guide Percutaneous nephrostomy is a procedure to insert a flexible tube into your kidney so that urine can leave your body. This procedure may be done if a medical condition prevents urine from leaving your kidney in the usual way. During the procedure, the nephrostomy tube is inserted in the right or left side of your lower back and is connected to an external drainage bag. After you have a nephrostomy tube placed, urine will collect in the drainage bag outside of your body. You will need to empty and change the drainage bag as needed. You will also need to take steps to care for the area where the nephrostomy tube was inserted (tube insertion site). How do I care for my nephrostomy tube?  Always keep your tubing, the leg bag, or the bedside drainage bag below the level of your kidney so that your urine drains freely.  Avoid activities that would cause bending or pulling of your tubing. Ask your health care provider what activities are safe for you.  When connecting your nephrostomy tube to a drainage bag, make sure that there are no kinks in the tubing and that your urine is draining freely. You may want to gently wrap an elastic bandage over the tubing. This will help keep the tubing in place and prevent it from kinking. Make sure there is no tension on the tubing so it does not become dislodged.  At night, you may want to connect your nephrostomy tube or the leg bag to a larger bedside drainage bag. How do I empty the drainage bag? Empty the leg bag or bedside drainage bag whenever it becomes ? full. Also empty it before you go to sleep. Most  drainage bags have a drain at the bottom that allows urine to be emptied. Follow these basic steps: 1. Hold the drainage bag over a toilet or collection container. Use a measuring container if your health care provider told you to measure your urine. 2. Open the drain of the bag and allow the urine to drain out. 3. After all the urine has drained from the drainage bag, close the drain fully. 4. Flush the urine down the toilet. If a collection container was used, rinse the container. How do I change the dressing around the nephrostomy tube? Change your dressing and clean your tube exit site as told by your health care provider. You may need to change the dressing every day for the first 2 weeks after having a nephrostomy tube inserted. After the first 2 weeks, you may be told to change the dressing two times a week. Supplies needed:  Mild soap and water.  Split gauze pads, 4  4 inches (10 x 10 cm).  Gauze pads, 4  4 inches (10 x 10 cm).  Paper tape. How to change the dressing: Because of the location of your nephrostomy tube, you may need help from another person to complete dressing changes. Follow these basic steps: 1. Wash hands with soap and water. 2. Gently remove the tape and dressing from around the nephrostomy tube. Be careful not to pull on the tube while removing the  dressing. Avoid using scissors because they may damage the tube. 3. Wash the skin around the tube with mild soap and water, rinse well, and pat the skin dry with a clean cloth. 4. Check the skin around the drain for redness, swelling, pus, warmth, or a bad smell. 5. If the drain was sutured to the skin, check the suture to verify that it is still anchored in the skin. 6. Place two split gauze pads in and around the tube exit site. Do not apply ointments or alcohol to the site. 7. Place a gauze pad on top of the split gauze pad. 8. Coil the tube on top of the gauze. The tubing should rest on the gauze, not on the  skin. 9. Place tape around each edge of the gauze pad. 10. Secure the nephrostomy tubing. Make sure that the tube does not kink or become pinched. The tubing should rest on the gauze pad, not on the skin. 11. Dispose of used supplies properly.  How do I flush my nephrostomy tube? Use a saline syringe to rinse out (flush) your nephrostomy tube as told by your health care provider. Flushing is easier if a three-way stopcock is placed between the tube and the drainage bag. One connection of the stopcock connects to your tube, the second connects to the drainage bag, and the third is usually covered with a cap. The lever on the stopcock points to the direction on the stopcock that is closed to flow. Normally, the lever points in the direction of the cap to allow urine to drain from the tube to the drainage bag. Supplies needed:  Rubbing alcohol wipe.  10 mL 0.9% saline syringe. How to flush the tube: 1. Move the lever of the three-way stopcock so it points toward the drainage bag. 2. Clean the cap with a rubbing alcohol wipe. 3. Screw the tip of a 10 mL 0.9% saline syringe onto the cap. 4. Using the syringe plunger, slowly push the 10 mL 0.9% saline in the syringe over 5-10 seconds. If resistance is met or pain occurs while pushing, stop pushing the saline. 5. Remove the syringe from the cap. 6. Return the stopcock lever to the usual position, pointing in the direction of the cap. 7. Dispose of used supplies properly. How do I replace the drainage bag? Replace the drainage bag, three-way stopcock, and any extension tubing as told by your health care provider. Make sure you always have an extra drainage bag and connecting tubing available. 1. Empty urine from your drainage bag. 2. Gather a new drainage bag, three-way stopcock, and any extension tubing. 3. Remove the drainage bag, three-way stopcock, and any extension tubing from the nephrostomy tube. 4. Attach the new leg bag or bedside drainage  bag, three-way stopcock, and any extension tubing to the nephrostomy tube. 5. Dispose of the used drainage bag, three-way stopcock, and any extension. Contact a health care provider if:  You have problems with any of the valves or tubing.  You have persistent pain or soreness in your back.  You have more redness, swelling, or pain around your tube insertion site.  You have more fluid or blood coming from your tube insertion site.  Your tube insertion site feels warm to the touch.  You have pus or a bad smell coming from your tube insertion site.  You have increased urine output or you feel burning when urinating. Get help right away if:  You have pain in your abdomen during the first week.  You have chest pain or have trouble breathing.  You have a new appearance of blood in your urine.  You have a fever or chills.  You have back pain that is not relieved by your medicine.  You have decreased urine output.  Your nephrostomy tube comes out. This information is not intended to replace advice given to you by your health care provider. Make sure you discuss any questions you have with your health care provider. Document Released: 09/08/2013 Document Revised: 08/30/2016 Document Reviewed: 08/30/2016 Elsevier Interactive Patient Education  2019 [employer]    Percutaneous Nephrolithotomy, Care After This sheet gives you information about how to care for yourself after your procedure. Your health care provider may also give you more specific instructions. If you have problems or questions, contact your health care provider. What can I expect after the procedure? After the procedure, it is common to have:  Soreness or pain.  A small amount of blood or clear fluid coming from your incision for a few days.  Fatigue.  Some blood in your urine. This will last for a few days. Follow these instructions at home: Medicines  Take over-the-counter and prescription medicines only  as told by your health care provider.  If you were prescribed an antibiotic medicine, take it as told by your health care provider. Do not stop using the antibiotic even if you start to feel better.  Ask your health care provider if the medicine prescribed to you: ? Requires you to avoid driving or using heavy machinery. ? Can cause constipation. You may need to take these actions to prevent or treat constipation:  Drink enough fluid to keep your urine pale yellow.  Take over-the-counter or prescription medicines.  Eat foods that are high in fiber, such as beans, whole grains, and fresh fruits and vegetables.  Limit foods that are high in fat and processed sugars, such as fried or sweet foods. Incision care   Follow instructions from your health care provider about how to take care of your incision. Make sure you: ? Wash your hands with soap and water before and after you change your bandage (dressing). If soap and water are not available, use hand sanitizer. ? Change your dressing as told by your health care provider. ? Leave stitches (sutures), skin glue, or adhesive strips in place. These skin closures may need to stay in place for 2 weeks or longer. If adhesive strip edges start to loosen and curl up, you may trim the loose edges. Do not remove adhesive strips completely unless your health care provider tells you to do that.  Check your incision area every day for signs of infection. Check for: ? Redness, swelling, or pain. ? More fluid or blood. ? Warmth. ? Pus or a bad smell.  Do not take baths, swim, or use a hot tub until your health care provider approves. Ask your health care provider if you may take showers. You may only be allowed to take sponge baths. Activity  Avoid strenuous activities for as long as told by your health care provider.  Return to your normal activities as told by your health care provider. Ask your health care provider what activities are safe for  you. General instructions  If you were sent home with a catheter or surgical drain (nephrostomy tube), follow your health care provider's instructions on how to take care of it.  Wear compression stockings as told by your health care provider. These stockings help to prevent blood clots  and reduce swelling in your legs.  Do not use any products that contain nicotine or tobacco, such as cigarettes, e-cigarettes, and chewing tobacco. These can delay incision healing after surgery. If you need help quitting, ask your health care provider.  Keep all follow-up visits as told by your health care provider. This is important. ? If you still have a stent, your health care provider will need to remove it after 1-2 weeks. Contact a health care provider if:  You have a fever.  You have redness, swelling, or pain around your incision.  You have more fluid or blood coming from your incision.  Your incision feels warm to the touch.  You have pus or a bad smell coming from your incision.  You lose your appetite.  You feel nauseous or you vomit.  You have been sent home with a urinary catheter or a surgical drain and urine flow suddenly stops, followed by kidney pain. Get help right away if:  You notice a large amount of blood in your urine.  You have blood clots in your urine.  You cannot urinate.  You have chest pain or trouble breathing. Summary  After the procedure, it is common to feel soreness and discomfort. You may also see blood in your urine.  You will be told how to care for yourself after the procedure. Follow instructions on how to care for your incision and how to recognize signs of infection.  Avoid activities that require great effort. Return to activities as told by your health care provider.  Get help right away if you have blood clots in your urine, you cannot urinate, or you have trouble breathing. This information is not intended to replace advice given to you by  your health care provider. Make sure you discuss any questions you have with your health care provider. Document Released: 12/15/2015 Document Revised: 06/10/2018 Document Reviewed: 06/10/2018 Elsevier Interactive Patient Education  2019 [place].    Moderate Conscious Sedation, Adult, Care After These instructions provide you with information about caring for yourself after your procedure. Your health care provider may also give you more specific instructions. Your treatment has been planned according to current medical practices, but problems sometimes occur. Call your health care provider if you have any problems or questions after your procedure. What can I expect after the procedure? After your procedure, it is common:  To feel sleepy for several hours.  To feel clumsy and have poor balance for several hours.  To have poor judgment for several hours.  To vomit if you eat too soon. Follow these instructions at home: For at least 24 hours after the procedure:   Do not: ? Participate in activities where you could fall or become injured. ? Drive. ? Use heavy machinery. ? Drink alcohol. ? Take sleeping pills or medicines that cause drowsiness. ? Make important decisions or sign legal documents. ? Take care of children on your own.  Rest. Eating and drinking  Follow the diet recommended by your health care provider.  If you vomit: ? Drink water, juice, or soup when you can drink without vomiting. ? Make sure you have little or no nausea before eating solid foods. General instructions  Have a responsible adult stay with you until you are awake and alert.  Take over-the-counter and prescription medicines only as told by your health care provider.  If you smoke, do not smoke without supervision.  Keep all follow-up visits as told by your health care provider. This  is important. Contact a health care provider if:  You keep feeling nauseous or you keep  vomiting.  You feel light-headed.  You develop a rash.  You have a fever. Get help right away if:  You have trouble breathing. This information is not intended to replace advice given to you by your health care provider. Make sure you discuss any questions you have with your health care provider. Document Released: 09/08/2013 Document Revised: 04/22/2016 Document Reviewed: 03/09/2016 Elsevier Interactive Patient Education  2019 [employer].  There is a good youtube video called "How To Take Care Of Your Percutaneous Nephrostomy Tube" by [facility] that is a great resource on how to take care of your nephrostomy tube.

## 2019-04-09 NOTE — Procedures (Signed)
Interventional Radiology Procedure Note  Procedure: Right 29F PCN placement  Complications: None  Estimated Blood Loss: None  Recommendations: - Observe for 4 hours - DC home if doing well  Signed,  Criselda Peaches, MD

## 2019-04-09 NOTE — H&P (Signed)
Chief Complaint: Patient was seen in consultation today for hydronephrosis  Referring Physician(s): Herrick,Benjamin W  Supervising Physician: Jacqulynn Cadet  Patient Status: [facility] - Out-pt  History of Present Illness: Erika Fry is a 73 y.o. female with past medical history of CKD Stage 4, DM, GERD, HTN, and hydronephrosis. Patient has right hydronephrosis due to UPJ obstruction and did not tolerate stent placement.  She is referred to Interventional Radiology for right percutaneous nephrostomy tube placement.   Case reviewed and approved by Dr. Laurence Ferrari.  Patient presents to [facility] in her usual states of health today. She states she has burning and severe pain with urination.  She reports she has been treated extensively with medications, topical ointments, and antibiotics without relief.  She reports she occasionally has back or flank pain.  She is hopefully that tube placement today will alleviate her pain and pressure.   Past Medical History:  Diagnosis Date  . Arthritis    "knees, thumbs" (03/25/2018)  . CKD (chronic kidney disease), stage IV ([place])   . Diet-controlled diabetes mellitus ([place])   . GERD (gastroesophageal reflux disease)   . Gout    "on daily RX" (03/25/2018)  . Heart murmur   . High cholesterol   . Hypertension   . Hypothyroidism   . Iron deficiency anemia    "had to get an iron infusion"  . Migraine    "used to have them growing up" (03/25/2018)  . Presence of permanent cardiac pacemaker 03/25/2018    Past Surgical History:  Procedure Laterality Date  . APPENDECTOMY    . BIOPSY  05/19/2018   Procedure: BIOPSY;  Surgeon: Ladene Artist, MD;  Location: [place];  Service: Endoscopy;;  . Consuela Mimes W/ RETROGRADES Bilateral 01/14/2019   Procedure: CYSTOSCOPY WITH RETROGRADE PYELOGRAM BILATERAL HYDRODISTENTION;  Surgeon: Ardis Hughs, MD;  Location: [facility];  Service: Urology;  Laterality: Bilateral;  .  ESOPHAGOGASTRODUODENOSCOPY N/A 05/19/2018   Procedure: ESOPHAGOGASTRODUODENOSCOPY (EGD);  Surgeon: Ladene Artist, MD;  Location: [facility] ENDOSCOPY;  Service: Endoscopy;  Laterality: N/A;  . ESOPHAGOGASTRODUODENOSCOPY (EGD) WITH PROPOFOL N/A 01/28/2019   Procedure: ESOPHAGOGASTRODUODENOSCOPY (EGD) WITH PROPOFOL;  Surgeon: Rush Landmark Telford Nab., MD;  Location: [place];  Service: Gastroenterology;  Laterality: N/A;  . INSERT / REPLACE / REMOVE PACEMAKER  03/25/2018  . IR FLUORO GUIDE CV LINE RIGHT  05/18/2018  . IR LUMBAR DISC ASPIRATION W/IMG GUIDE  05/15/2018  . IR REMOVAL TUN CV CATH W/O FL  07/23/2018  . IR US GUIDE VASC ACCESS RIGHT  05/18/2018  . KNEE ARTHROSCOPY Bilateral   . PACEMAKER IMPLANT N/A 03/25/2018   Procedure: PACEMAKER IMPLANT;  Surgeon: Evans Lance, MD;  Location: [place] CV LAB;  Service: Cardiovascular;  Laterality: N/A;  . PACEMAKER PLACEMENT Left 03/2018  . POLYPECTOMY  01/28/2019   Procedure: POLYPECTOMY;  Surgeon: Mansouraty, Telford Nab., MD;  Location: [place];  Service: Gastroenterology;;  . RIGHT HEART CATH N/A 03/20/2018   Procedure: RIGHT HEART CATH;  Surgeon: Nigel Mormon, MD;  Location: [place] CV LAB;  Service: Cardiovascular;  Laterality: N/A;  . TEE WITHOUT CARDIOVERSION N/A 03/20/2018   Procedure: TRANSESOPHAGEAL ECHOCARDIOGRAM (TEE);  Surgeon: Nigel Mormon, MD;  Location: [facility] ENDOSCOPY;  Service: Cardiovascular;  Laterality: N/A;  . TONSILLECTOMY    . URETEROSCOPY WITH HOLMIUM LASER LITHOTRIPSY Right 01/14/2019   Procedure: URETEROSCOPY WITH HOLMIUM LASER LITHOTRIPSY BLADDER BIOPSIES RIGHT STENT PLACEMENT;  Surgeon: Ardis Hughs, MD;  Location[facility];  Service: Urology;  Laterality: Right;  Allergies: Baclofen and Codeine  Medications: Prior to Admission medications   Medication Sig Start Date End Date Taking? Authorizing Provider  Ascorbic Acid (VITAMIN C PO) Take 1 tablet by mouth 2 (two) times daily.   Yes  [provider]  Biotin 5 MG CAPS Take 1 capsule (5 mg total) by mouth daily. 01/14/19  Yes Ardis Hughs, MD  cetirizine (ZYRTEC) 10 MG tablet Take 10 mg by mouth at bedtime.    Yes [provider]  diazepam (VALIUM) 10 MG tablet 10 mg See admin instructions. Insert 10 mg vaginally at bedtime   Yes [provider]  ferrous sulfate 325 (65 FE) MG tablet Take 1 tablet (325 mg total) by mouth 2 (two) times daily with a meal. 06/22/18  Yes Love, Ivan Anchors, PA-C  folic acid (FOLVITE) 1 MG tablet Take 1 tablet (1 mg total) by mouth daily. 06/22/18  Yes Love, Ivan Anchors, PA-C  furosemide (LASIX) 40 MG tablet Take 2 tablets (80 mg total) by mouth 2 (two) times daily. 02/19/19 02/19/20 Yes Georgette Shell, MD  gabapentin (NEURONTIN) 300 MG capsule Take 300 mg by mouth See admin instructions. Take one capsule on day 1, then 2 capsules day 2 then take 3 capsules thereafter 02/09/19  Yes [provider]  levofloxacin (LEVAQUIN) 250 MG tablet Take 250 mg by mouth daily. 02/22/19  Yes [provider]  levothyroxine (SYNTHROID, LEVOTHROID) 75 MCG tablet Take 75 mcg by mouth daily before breakfast.   Yes [provider]  mirabegron ER (MYRBETRIQ) 50 MG TB24 tablet Take 50 mg by mouth daily.   Yes [provider]  Multiple Vitamin (MULTI-VITAMIN PO) Take 1 tablet by mouth daily.   Yes [provider]  pantoprazole (PROTONIX) 40 MG tablet Take 1 tablet (40 mg total) by mouth 2 (two) times daily for 30 days. (take once daily after 1 month) 01/29/19 04/09/19 Yes Elodia Florence., MD  potassium chloride (K-DUR) 10 MEQ tablet Take 1 tablet (10 mEq total) by mouth 2 (two) times daily. 02/19/19  Yes Georgette Shell, MD  predniSONE (DELTASONE) 10 MG tablet Take 3 tabs daily for 3 days then 2 tabs daily for 3 days then 1 tab daily till done 02/19/19  Yes Georgette Shell, MD  simvastatin (ZOCOR) 40 MG tablet Take 0.5 tablets (20 mg total) by  mouth at bedtime. Patient taking differently: Take 40 mg by mouth at bedtime.  06/22/18  Yes Love, Ivan Anchors, PA-C  sodium bicarbonate 650 MG tablet Take 2 tablets (1,300 mg total) by mouth 2 (two) times daily. 02/19/19  Yes Georgette Shell, MD  solifenacin (VESICARE) 5 MG tablet Take 5 mg by mouth daily. 02/04/19  Yes [provider]  vitamin B-12 (CYANOCOBALAMIN) 1000 MCG tablet Take 1 tablet (1,000 mcg total) by mouth daily. 06/22/18  Yes Love, Ivan Anchors, PA-C  acetaminophen (TYLENOL) 325 MG tablet Take 650 mg by mouth as needed.    [provider]  colchicine 0.6 MG tablet Take 1 tablet (0.6 mg total) by mouth daily. 02/19/19   Georgette Shell, MD  fluticasone [facility]) 50 MCG/ACT nasal spray Place 2 sprays into both nostrils daily as needed for allergies or rhinitis.    [provider]  Hydrocortisone (GERHARDT'S BUTT CREAM) CREA Apply 1 application topically 4 (four) times daily. 02/19/19   Georgette Shell, MD  oxyCODONE-acetaminophen (PERCOCET/ROXICET) 5-325 MG tablet Take 1 tablet by mouth 2 (two) times daily. 02/09/19   [provider]  Family History  Problem Relation Age of Onset  . Hypertension Mother   . Diabetes Mellitus II Father   . Heart disease Father   . Heart attack Father   . Gastric cancer Brother   . Diabetes Mellitus II Brother   . Stomach cancer Brother     Social History   Socioeconomic History  . Marital status: Widowed    Spouse name: Not on file  . Number of children: 3  . Years of education: Not on file  . Highest education level: Not on file  Occupational History  . Not on file  Social Needs  . Financial resource strain: Not on file  . Food insecurity:    Worry: Not on file    Inability: Not on file  . Transportation needs:    Medical: Not on file    Non-medical: Not on file  Tobacco Use  . Smoking status: Never Smoker  . Smokeless tobacco: Never Used  Substance and Sexual Activity  . Alcohol use:  Not Currently  . Drug use: Never  . Sexual activity: Not Currently  Lifestyle  . Physical activity:    Days per week: Not on file    Minutes per session: Not on file  . Stress: Not on file  Relationships  . Social connections:    Talks on phone: Not on file    Gets together: Not on file    Attends religious service: Not on file    Active member of club or organization: Not on file    Attends meetings of clubs or organizations: Not on file    Relationship status: Not on file  Other Topics Concern  . Not on file  Social History Narrative  . Not on file     Review of Systems: A 12 point ROS discussed and pertinent positives are indicated in the HPI above.  All other systems are negative.  Review of Systems  Constitutional: Negative for fatigue and fever.  Respiratory: Negative for cough and shortness of breath.   Cardiovascular: Negative for chest pain.  Gastrointestinal: Negative for abdominal pain.  Genitourinary: Positive for dysuria, flank pain and vaginal pain.  Musculoskeletal: Positive for back pain.    Vital Signs: BP 140/75   Pulse 74   Temp 97.8 F (36.6 C) (Oral)   Resp 18   SpO2 100%   Physical Exam Vitals signs and nursing note reviewed.  Constitutional:      Appearance: Normal appearance.  Cardiovascular:     Rate and Rhythm: Normal rate and regular rhythm.     Heart sounds: No murmur. No friction rub. No gallop.   Pulmonary:     Effort: Pulmonary effort is normal.     Breath sounds: Normal breath sounds.  Skin:    General: Skin is warm and dry.  Neurological:     General: No focal deficit present.     Mental Status: She is alert and oriented to person, place, and time. Mental status is at baseline.  Psychiatric:        Mood and Affect: Mood normal.        Behavior: Behavior normal.        Thought Content: Thought content normal.        Judgment: Judgment normal.      MD Evaluation Airway: WNL Heart: WNL Abdomen: WNL Chest/ Lungs:  WNL ASA  Classification: 3 Mallampati/Airway Score: One   Imaging: No results found.  Labs:  CBC: Recent Labs    02/14/19  1941 02/15/19 0242 02/16/19 0814 04/09/19 0751  WBC 6.2 6.6 7.7 8.5  HGB 7.8* 7.8* 8.2* 10.3*  HCT 24.5* 24.5* 24.8* 30.8*  PLT 211 247 240 292    COAGS: Recent Labs    05/13/18 0427 05/14/18 1151 04/09/19 0751  INR 1.01 1.01 1.0  APTT 25  --  30    BMP: Recent Labs    02/15/19 0242 02/16/19 0814 02/17/19 0508 02/18/19 0452  NA 134* 133* 130* 129*  K 3.9 3.3* 3.8 4.1  CL 101 97* 91* 88*  CO2 20* 22 23 25   GLUCOSE 89 109* 145* 126*  BUN 56* 53* 56* 61*  CALCIUM 9.5 9.4 9.7 10.3  CREATININE 3.66* 3.54* 3.42* 3.16*  GFRNONAA 12* 12* 13* 14*  GFRAA 13* 14* 15* 16*    LIVER FUNCTION TESTS: Recent Labs    02/15/19 0242 02/16/19 0814 02/17/19 0508 02/18/19 0452  BILITOT 0.5 0.8 0.9 0.4  AST 12* 12* 11* 16  ALT 7 9 7 7   ALKPHOS 84 84 80 69  PROT 6.3* 6.0* 6.8 6.7  ALBUMIN 2.5*  2.5* 2.4*  2.4* 3.3*  3.3* 4.1  4.1    TUMOR MARKERS: No results for input(s): AFPTM, CEA, CA199, CHROMGRNA in the last 8760 hours.  Assessment and Plan: Patient with past medical history of CKD Stage 4 and UPJ obstruction presents with complaint of right hydronephrosis.  IR consulted for percutaneous nephrostomy tube placement at the request of Dr. Louis Meckel. Case reviewed by Dr. Laurence Ferrari who approves patient for procedure.  Patient presents today in their usual state of health.  She has been NPO and is not currently on blood thinners.   Risks and benefits were discussed with the patient including, but not limited to, infection, bleeding, significant bleeding causing loss or decrease in renal function or damage to adjacent structures.   All of the patient's questions were answered, patient is agreeable to proceed.  Consent signed and in chart.  Thank you for this interesting consult.  I greatly enjoyed meeting Erika Fry and look  forward to participating in their care.  A copy of this report was sent to the requesting provider on this date.  Electronically Signed: Docia Barrier, PA 04/09/2019, 9:34 AM   I spent a total of  30 Minutes   in face to face in clinical consultation, greater than 50% of which was counseling/coordinating care for right hydronephrosis.

## 2019-04-10 DIAGNOSIS — K219 Gastro-esophageal reflux disease without esophagitis: Secondary | ICD-10-CM | POA: Diagnosis present

## 2019-04-10 DIAGNOSIS — N281 Cyst of kidney, acquired: Secondary | ICD-10-CM | POA: Diagnosis present

## 2019-04-10 DIAGNOSIS — Z7189 Other specified counseling: Secondary | ICD-10-CM | POA: Diagnosis not present

## 2019-04-10 DIAGNOSIS — E1122 Type 2 diabetes mellitus with diabetic chronic kidney disease: Secondary | ICD-10-CM | POA: Diagnosis present

## 2019-04-10 DIAGNOSIS — N2581 Secondary hyperparathyroidism of renal origin: Secondary | ICD-10-CM | POA: Diagnosis present

## 2019-04-10 DIAGNOSIS — R300 Dysuria: Secondary | ICD-10-CM | POA: Diagnosis not present

## 2019-04-10 DIAGNOSIS — E039 Hypothyroidism, unspecified: Secondary | ICD-10-CM | POA: Diagnosis present

## 2019-04-10 DIAGNOSIS — K5903 Drug induced constipation: Secondary | ICD-10-CM | POA: Diagnosis not present

## 2019-04-10 DIAGNOSIS — I130 Hypertensive heart and chronic kidney disease with heart failure and stage 1 through stage 4 chronic kidney disease, or unspecified chronic kidney disease: Secondary | ICD-10-CM | POA: Diagnosis present

## 2019-04-10 DIAGNOSIS — I959 Hypotension, unspecified: Secondary | ICD-10-CM | POA: Diagnosis not present

## 2019-04-10 DIAGNOSIS — N184 Chronic kidney disease, stage 4 (severe): Secondary | ICD-10-CM | POA: Diagnosis present

## 2019-04-10 DIAGNOSIS — N3010 Interstitial cystitis (chronic) without hematuria: Secondary | ICD-10-CM | POA: Diagnosis present

## 2019-04-10 DIAGNOSIS — D649 Anemia, unspecified: Secondary | ICD-10-CM | POA: Diagnosis not present

## 2019-04-10 DIAGNOSIS — R3989 Other symptoms and signs involving the genitourinary system: Secondary | ICD-10-CM | POA: Diagnosis not present

## 2019-04-10 DIAGNOSIS — R102 Pelvic and perineal pain: Secondary | ICD-10-CM | POA: Diagnosis not present

## 2019-04-10 DIAGNOSIS — E785 Hyperlipidemia, unspecified: Secondary | ICD-10-CM | POA: Diagnosis present

## 2019-04-10 DIAGNOSIS — N319 Neuromuscular dysfunction of bladder, unspecified: Secondary | ICD-10-CM | POA: Diagnosis not present

## 2019-04-10 DIAGNOSIS — N130 Hydronephrosis with ureteropelvic junction obstruction: Secondary | ICD-10-CM | POA: Diagnosis not present

## 2019-04-10 DIAGNOSIS — N1330 Unspecified hydronephrosis: Secondary | ICD-10-CM | POA: Diagnosis present

## 2019-04-10 DIAGNOSIS — D696 Thrombocytopenia, unspecified: Secondary | ICD-10-CM | POA: Diagnosis not present

## 2019-04-10 DIAGNOSIS — N131 Hydronephrosis with ureteral stricture, not elsewhere classified: Secondary | ICD-10-CM | POA: Diagnosis not present

## 2019-04-10 DIAGNOSIS — R0989 Other specified symptoms and signs involving the circulatory and respiratory systems: Secondary | ICD-10-CM | POA: Diagnosis not present

## 2019-04-10 DIAGNOSIS — I442 Atrioventricular block, complete: Secondary | ICD-10-CM | POA: Diagnosis present

## 2019-04-10 DIAGNOSIS — M25559 Pain in unspecified hip: Secondary | ICD-10-CM | POA: Diagnosis not present

## 2019-04-10 DIAGNOSIS — N179 Acute kidney failure, unspecified: Secondary | ICD-10-CM | POA: Diagnosis present

## 2019-04-10 DIAGNOSIS — D638 Anemia in other chronic diseases classified elsewhere: Secondary | ICD-10-CM | POA: Diagnosis present

## 2019-04-10 DIAGNOSIS — N1339 Other hydronephrosis: Secondary | ICD-10-CM | POA: Diagnosis not present

## 2019-04-10 DIAGNOSIS — E871 Hypo-osmolality and hyponatremia: Secondary | ICD-10-CM | POA: Diagnosis present

## 2019-04-10 DIAGNOSIS — E861 Hypovolemia: Secondary | ICD-10-CM | POA: Diagnosis not present

## 2019-04-10 DIAGNOSIS — E872 Acidosis: Secondary | ICD-10-CM | POA: Diagnosis present

## 2019-04-10 DIAGNOSIS — E1169 Type 2 diabetes mellitus with other specified complication: Secondary | ICD-10-CM | POA: Diagnosis present

## 2019-04-10 DIAGNOSIS — I5032 Chronic diastolic (congestive) heart failure: Secondary | ICD-10-CM | POA: Diagnosis present

## 2019-04-10 DIAGNOSIS — R5381 Other malaise: Secondary | ICD-10-CM | POA: Diagnosis not present

## 2019-04-10 DIAGNOSIS — N132 Hydronephrosis with renal and ureteral calculous obstruction: Secondary | ICD-10-CM | POA: Diagnosis present

## 2019-04-10 DIAGNOSIS — Z515 Encounter for palliative care: Secondary | ICD-10-CM | POA: Diagnosis not present

## 2019-04-10 DIAGNOSIS — N185 Chronic kidney disease, stage 5: Secondary | ICD-10-CM | POA: Diagnosis not present

## 2019-04-10 DIAGNOSIS — G8929 Other chronic pain: Secondary | ICD-10-CM | POA: Diagnosis present

## 2019-04-10 DIAGNOSIS — E119 Type 2 diabetes mellitus without complications: Secondary | ICD-10-CM | POA: Diagnosis not present

## 2019-04-10 DIAGNOSIS — N3289 Other specified disorders of bladder: Secondary | ICD-10-CM | POA: Diagnosis present

## 2019-04-10 DIAGNOSIS — I872 Venous insufficiency (chronic) (peripheral): Secondary | ICD-10-CM | POA: Diagnosis present

## 2019-04-10 DIAGNOSIS — I9589 Other hypotension: Secondary | ICD-10-CM | POA: Diagnosis not present

## 2019-04-10 DIAGNOSIS — E669 Obesity, unspecified: Secondary | ICD-10-CM | POA: Diagnosis not present

## 2019-04-10 DIAGNOSIS — N189 Chronic kidney disease, unspecified: Secondary | ICD-10-CM | POA: Diagnosis not present

## 2019-04-10 DIAGNOSIS — M8580 Other specified disorders of bone density and structure, unspecified site: Secondary | ICD-10-CM | POA: Diagnosis present

## 2019-04-10 DIAGNOSIS — E875 Hyperkalemia: Secondary | ICD-10-CM | POA: Diagnosis not present

## 2019-04-10 DIAGNOSIS — E46 Unspecified protein-calorie malnutrition: Secondary | ICD-10-CM | POA: Diagnosis not present

## 2019-04-10 LAB — CBC
HCT: 28.5 % — ABNORMAL LOW (ref 36.0–46.0)
Hemoglobin: 9.3 g/dL — ABNORMAL LOW (ref 12.0–15.0)
MCH: 29.1 pg (ref 26.0–34.0)
MCHC: 32.6 g/dL (ref 30.0–36.0)
MCV: 89.1 fL (ref 80.0–100.0)
Platelets: 209 10*3/uL (ref 150–400)
RBC: 3.2 MIL/uL — ABNORMAL LOW (ref 3.87–5.11)
RDW: 15 % (ref 11.5–15.5)
WBC: 20.4 10*3/uL — ABNORMAL HIGH (ref 4.0–10.5)
nRBC: 0 % (ref 0.0–0.2)

## 2019-04-10 LAB — GLUCOSE, CAPILLARY
Glucose-Capillary: 165 mg/dL — ABNORMAL HIGH (ref 70–99)
Glucose-Capillary: 165 mg/dL — ABNORMAL HIGH (ref 70–99)
Glucose-Capillary: 123 mg/dL — ABNORMAL HIGH (ref 70–99)

## 2019-04-10 LAB — BASIC METABOLIC PANEL
Anion gap: 17 — ABNORMAL HIGH (ref 5–15)
BUN: 138 mg/dL — ABNORMAL HIGH (ref 8–23)
CO2: 15 mmol/L — ABNORMAL LOW (ref 22–32)
Calcium: 9.2 mg/dL (ref 8.9–10.3)
Chloride: 97 mmol/L — ABNORMAL LOW (ref 98–111)
Creatinine, Ser: 4.78 mg/dL — ABNORMAL HIGH (ref 0.44–1.00)
GFR calc Af Amer: 10 mL/min — ABNORMAL LOW (ref >60)
GFR calc non Af Amer: 8 mL/min — ABNORMAL LOW (ref >60)
Glucose, Bld: 152 mg/dL — ABNORMAL HIGH (ref 70–99)
Potassium: 4.9 mmol/L (ref 3.5–5.1)
Sodium: 129 mmol/L — ABNORMAL LOW (ref 135–145)

## 2019-04-10 MED ORDER — PHENOL 1.4 % MT LIQD
1.0000 | OROMUCOSAL | Status: DC | PRN
  Administered 2019-04-10: 1 via OROMUCOSAL
  Filled 2019-04-10: qty 177

## 2019-04-10 NOTE — Progress Notes (Addendum)
PROGRESS NOTE    Erika Fry  QMV:[id number] DOB: [birth year] DOA: 04/09/2019 PCP: [name], MD    Brief Narrative:  73 year old female who presents with severe right flank pain post nephrostomy tube insertion, she does have chronic pelvic pain related to interstitial cystitis. Past medical history of chronic kidney disease stage IV, type 2 diabetes mellitus, dyslipidemia, complete AV block status post dual-chamber pacemaker, diastolic heart failure, hypothyroidism and anemia. On her initial physical examination her blood pressure was 134/69, heart rate 112, respiratory rate 24, oxygen saturation 98%, her lungs were clear to auscultation bilaterally she does have a systolic murmur, abdomen is soft nontender, nonpitting lower extremity edema.  Right nephrostomy tube in place.  White cell count is 8.5, hemoglobin 10.3, hematocrit is 30.8, platelets 292.  Sodium 129, potassium 5.2, chloride 102, bicarb 9, glucose 108, BUN 135, creatinine 4.6 anion gap 18.  Patient was admitted to the hospital with the working diagnosis of intractable flank pain complicated by AKI on CKD stage 4.     Assessment & Plan:   Principal Problem:   Hydronephrosis Active Problems:   Mobitz type 2 second degree AV block   CKD (chronic kidney disease) stage 4, GFR 15-29 ml/min (HCC)   Diet-controlled diabetes mellitus ([place])   Essential hypertension   Interstitial cystitis   1. AKI on ckd stage 4, complicated with hyponatremia, hyperkaelmia and anion gap metabolic acidosis. Will continue IV fluids with isotonic saline, increase rate to 75 ml/H, patient continue to have very poor oral intake and is in risk for worsening renal injury. Continue worsening renal function with serum cr at 4,78, her K is down to 4,9 and serum bicarbonate is at 15. Will follow on renal panel in am, avoid hypotension and nephrotoxic medications.   2. Intractable back pain at the site of nephrostomy tube, on chronic pelvic pain,  interstitial cystitis. Patient has failed outpatient therapy with IV hydromorphone, will add oral oxycodone for better pain control.  Avoid NSAIDs due to AKI on CKD stage IV. Continue oral sodium bicarbonate bid. Continue enablex and mirabegron.   3. Right hydronephrosis. SP nephrostomy tube, on prophylactic antibiotic therapy per IR/ levofloxacin. Positive leukocytosis, possible reactive, today at 20,4. Will check on urine culture for now.    4. T2DM. Continue glucose cover and monitoring with insulin sliding scale, is not tolerating po well. Her fasting glucose is 152 this am.   5. Levothyroxine. Continue levothyroxine per home regimen.     DVT prophylaxis: heparin   Code Status:  full Family Communication: no family at the bedside  Disposition Plan/ discharge barriers: will change patient to inpatient.   Body mass index is 25.47 kg/m. Malnutrition Type:      Malnutrition Characteristics:      Nutrition Interventions:     RN Pressure Injury Documentation:    Consultants:     Procedures:     Antimicrobials:   Oral levofloxacin     Subjective: Patient not feeling well, continue to have right flank pain, not tolerating po well, no chest pain or dyspnea.   Objective: Vitals:   04/10/19 0145 04/10/19 0416 04/10/19 0835 04/10/19 0900  BP: (!) 113/59 109/63  101/72  Pulse: 86 84  80  Resp: 18 19  (!) 22  Temp: 98.2 F (36.8 C) 97.7 F (36.5 C)  97.9 F (36.6 C)  TempSrc: Oral Oral    SpO2: 100% 100% 99% 99%  Weight:      Height:  Intake/Output Summary (Last 24 hours) at 04/10/2019 1020 Last data filed at 04/10/2019 0842 Gross per 24 hour  Intake 449.33 ml  Output 1200 ml  Net -750.67 ml   Filed Weights   04/09/19 1609  Weight: 67.3 kg    Examination:   General: deconditioned and ill looking appearing  Neurology: Awake and alert, non focal  E ENT: positive pallor, no icterus, oral mucosa moist Cardiovascular: No JVD. S1-S2 present,  rhythmic, no gallops, rubs, or murmurs. ++ non pitting lower extremity edema. Pulmonary: positive breath sounds bilaterally, adequate air movement, no wheezing, rhonchi or rales. Gastrointestinal. Abdomen protuberant no organomegaly, non tender, no rebound or guarding Skin. No rashes Musculoskeletal: no joint deformities Right nephrostomy tube in place.     Data Reviewed: I have personally reviewed following labs and imaging studies  CBC: Recent Labs  Lab 04/09/19 0751 04/10/19 0515  WBC 8.5 20.4*  HGB 10.3* 9.3*  HCT 30.8* 28.5*  MCV 86.5 89.1  PLT 292 425   Basic Metabolic Panel: Recent Labs  Lab 04/09/19 1708 04/10/19 0515  NA 129* 129*  K 5.2* 4.9  CL 102 97*  CO2 9* 15*  GLUCOSE 108* 152*  BUN 135* 138*  CREATININE 4.60* 4.78*  CALCIUM 9.9 9.2   GFR: Estimated Creatinine Clearance: 9.7 mL/min (A) (by C-G formula based on SCr of 4.78 mg/dL (H)). Liver Function Tests: No results for input(s): AST, ALT, ALKPHOS, BILITOT, PROT, ALBUMIN in the last 168 hours. No results for input(s): LIPASE, AMYLASE in the last 168 hours. No results for input(s): AMMONIA in the last 168 hours. Coagulation Profile: Recent Labs  Lab 04/09/19 0751  INR 1.0   Cardiac Enzymes: No results for input(s): CKTOTAL, CKMB, CKMBINDEX, TROPONINI in the last 168 hours. BNP (last 3 results) No results for input(s): PROBNP in the last 8760 hours. HbA1C: No results for input(s): HGBA1C in the last 72 hours. CBG: Recent Labs  Lab 04/09/19 0757 04/09/19 1643 04/09/19 2104  GLUCAP 106* 99 136*   Lipid Profile: No results for input(s): CHOL, HDL, LDLCALC, TRIG, CHOLHDL, LDLDIRECT in the last 72 hours. Thyroid Function Tests: No results for input(s): TSH, T4TOTAL, FREET4, T3FREE, THYROIDAB in the last 72 hours. Anemia Panel: No results for input(s): VITAMINB12, FOLATE, FERRITIN, TIBC, IRON, RETICCTPCT in the last 72 hours.    Radiology Studies: I have reviewed all of the imaging  during this hospital visit personally     Scheduled Meds: . darifenacin  7.5 mg Oral Daily  . enoxaparin (LOVENOX) injection  30 mg Subcutaneous Q24H  . ferrous sulfate  325 mg Oral BID WC  . folic acid  1 mg Oral Daily  . gabapentin  300 mg Oral Daily  . insulin aspart  0-9 Units Subcutaneous TID WC  . levofloxacin  250 mg Oral Daily  . levothyroxine  75 mcg Oral QAC breakfast  . mirabegron ER  50 mg Oral Daily  . pantoprazole  40 mg Oral BID  . simvastatin  40 mg Oral QHS  . sodium bicarbonate  1,300 mg Oral BID  . vitamin B-12  1,000 mcg Oral Daily   Continuous Infusions: . sodium chloride 50 mL/hr at 04/09/19 1731     LOS: 0 days        Mauricio Gerome Apley, MD

## 2019-04-10 NOTE — Progress Notes (Signed)
Patient ID: Erika Fry, female   DOB: Nov 16, 1946, 73 y.o.   MRN: [id number] IR round note via phone call with nurse: Patient status post right percutaneous nephrostomy on 5/8; nurse reports that patient continues to have right flank discomfort BP 103/73, temp 98, heart rate 75, O2 sats 100% room air Right nephrostomy intact, output 225 cc of turbid yellow urine , 975 cc output yesterday, urine culture pending WBC 20.4 up from 8.5, hemoglobin 9.3 down from 10.3, creatinine 4.78 up from 4.6 Appreciate TRH admission of patient; continue with gentle IV hydration, follow-up with urine culture and sensitivities, recommend urology follow-up while in-house; will need scheduled nephrostomy tube check and change in 6 weeks otherwise

## 2019-04-11 DIAGNOSIS — N179 Acute kidney failure, unspecified: Principal | ICD-10-CM

## 2019-04-11 LAB — CBC WITH DIFFERENTIAL/PLATELET
Abs Immature Granulocytes: 0.02 10*3/uL (ref 0.00–0.07)
Basophils Absolute: 0 10*3/uL (ref 0.0–0.1)
Basophils Relative: 0 %
Eosinophils Absolute: 0 10*3/uL (ref 0.0–0.5)
Eosinophils Relative: 0 %
HCT: 26.6 % — ABNORMAL LOW (ref 36.0–46.0)
Hemoglobin: 8.6 g/dL — ABNORMAL LOW (ref 12.0–15.0)
Immature Granulocytes: 0 %
Lymphocytes Relative: 3 %
Lymphs Abs: 0.2 10*3/uL — ABNORMAL LOW (ref 0.7–4.0)
MCH: 29.3 pg (ref 26.0–34.0)
MCHC: 32.3 g/dL (ref 30.0–36.0)
MCV: 90.5 fL (ref 80.0–100.0)
Monocytes Absolute: 0.2 10*3/uL (ref 0.1–1.0)
Monocytes Relative: 4 %
Neutro Abs: 4.5 10*3/uL (ref 1.7–7.7)
Neutrophils Relative %: 93 %
Platelets: 142 10*3/uL — ABNORMAL LOW (ref 150–400)
RBC: 2.94 MIL/uL — ABNORMAL LOW (ref 3.87–5.11)
RDW: 15.5 % (ref 11.5–15.5)
WBC: 4.8 10*3/uL (ref 4.0–10.5)
nRBC: 0 % (ref 0.0–0.2)

## 2019-04-11 LAB — BASIC METABOLIC PANEL
Anion gap: 14 (ref 5–15)
BUN: 130 mg/dL — ABNORMAL HIGH (ref 8–23)
CO2: 14 mmol/L — ABNORMAL LOW (ref 22–32)
Calcium: 8.4 mg/dL — ABNORMAL LOW (ref 8.9–10.3)
Chloride: 101 mmol/L (ref 98–111)
Creatinine, Ser: 4.7 mg/dL — ABNORMAL HIGH (ref 0.44–1.00)
GFR calc Af Amer: 10 mL/min — ABNORMAL LOW (ref >60)
GFR calc non Af Amer: 9 mL/min — ABNORMAL LOW (ref >60)
Glucose, Bld: 97 mg/dL (ref 70–99)
Potassium: 4.6 mmol/L (ref 3.5–5.1)
Sodium: 129 mmol/L — ABNORMAL LOW (ref 135–145)

## 2019-04-11 LAB — GLUCOSE, CAPILLARY
Glucose-Capillary: 106 mg/dL — ABNORMAL HIGH (ref 70–99)
Glucose-Capillary: 106 mg/dL — ABNORMAL HIGH (ref 70–99)
Glucose-Capillary: 101 mg/dL — ABNORMAL HIGH (ref 70–99)
Glucose-Capillary: 112 mg/dL — ABNORMAL HIGH (ref 70–99)

## 2019-04-11 LAB — URINE CULTURE: Culture: NO GROWTH

## 2019-04-11 NOTE — Progress Notes (Signed)
Pt only having urine output from right nephrostomy. Pure wick in place with no output. Bladder scan performed showing only 11 mL. Pt denies urge to void. MD notified will continue to monitor output.

## 2019-04-11 NOTE — Progress Notes (Signed)
Consult was placed for iv restart;  Pt extremely anxious, tense, saying " help me God, oh help me God;"  Attempted x 2 using ultrasound without success.  RN was at bedside trying to distract the pt;  No further attempts made; pt too upset about needing another iv.

## 2019-04-11 NOTE — Progress Notes (Signed)
PROGRESS NOTE    Erika Fry  UVO:[id number] DOB: 13-Jan-1946 DOA: 04/09/2019 PCP: [name], MD    Brief Narrative:  73 year old female who presents with severe right flank pain post nephrostomy tube insertion, she does have chronic pelvic pain related to interstitial cystitis. Past medical history of chronic kidney disease stage IV, type 2 diabetes mellitus, dyslipidemia, complete AV block status post dual-chamber pacemaker, diastolic heart failure, hypothyroidism and anemia.On herinitial physical examination her blood pressure was 134/69, heart rate 112, respiratory rate 24, oxygen saturation 98%, her lungs were clear to auscultation bilaterally she does have a systolic murmur, abdomen is soft nontender, nonpitting lower extremity edema. Right nephrostomy tube in place. White cell count is 8.5, hemoglobin 10.3, hematocrit is 30.8, platelets 292.  Sodium 129, potassium 5.2, chloride 102, bicarb 9, glucose 108, BUN 135, creatinine 4.6 anion gap 18.  Patient was admitted to the hospital with the working diagnosis of intractable flank pain complicated by AKI on CKD stage 4.       Assessment & Plan:   Principal Problem:   Hydronephrosis Active Problems:   Mobitz type 2 second degree AV block   CKD (chronic kidney disease) stage 4, GFR 15-29 ml/min (HCC)   Diet-controlled diabetes mellitus (HCC)   Essential hypertension   Interstitial cystitis   AKI (acute kidney injury) ([place])   1. AKI on ckd stage 4, complicated with hyponatremia, hyperkaelmia and anion gap metabolic acidosis. Renal function with serum cr at 4,70 from 4,78, her K is down to 4,6 and serum bicarbonate is down to 14, with positive anion gap. Urine output up to 1425 ml, post obstructtive diuresis, continue hydration with IV fluids at 100 ml per Hr, with NS. Continue oral serum bicarbonate.   2. Intractable back pain at the site of nephrostomy tube, on chronic pelvic pain, interstitial cystitis. Continue  analgesia with IV hydromorphone, and po oxycodone.   3. Right hydronephrosis. SP nephrostomy tube, On oral levofloxacin. Improved leukocytosis down to 4,8, urine culture has been no growth. Will plan to stop antibiotic therapy in am if patient continues to show no signs of systemic infection.   4. T2DM. Glucose cover and monitoring with insulin sliding scale, continue to encourage po intake. No nausea or vomiting.  5. Levothyroxine. On levothyroxine.   6. Deconditioning. Patient is very weak and deconditioned,having difficulty to get out of bed, will consult physical therapy evaluation.   DVT prophylaxis: heparin   Code Status:  full Family Communication: no family at the bedside  Disposition Plan/ discharge barriers: pending clinical improvement.   Body mass index is 25.47 kg/m. Malnutrition Type:      Malnutrition Characteristics:      Nutrition Interventions:     RN Pressure Injury Documentation:    Consultants:   IR  Procedures:     Antimicrobials:   Po levofloxacin     Subjective: Patient continue to have right flank and pelvic pain, moderate to severe in intensity, improved with analgesics, no nausea or vomiting, very weak and deconditioned.   Objective: Vitals:   04/10/19 0900 04/10/19 1500 04/10/19 2104 04/11/19 0709  BP: 101/72 103/73 111/70 109/73  Pulse: 80 75  99  Resp: (!) 22 16 20 20   Temp: 97.9 F (36.6 C) 98 F (36.7 C) 98.8 F (37.1 C) 99 F (37.2 C)  TempSrc:   Oral Oral  SpO2: 99% 100% 99% 100%  Weight:      Height:        Intake/Output Summary (Last 24 hours)  at 04/11/2019 1004 Last data filed at 04/11/2019 2620 Gross per 24 hour  Intake 530 ml  Output 1550 ml  Net -1020 ml   Filed Weights   04/09/19 1609  Weight: 67.3 kg    Examination:   General: Patient very weak and deconditioned  Neurology: Awake and alert, non focal  E ENT: mild pallor, no icterus, oral mucosa moist Cardiovascular: No JVD. S1-S2  present, rhythmic, no gallops, rubs, or murmurs. Non pitting lower extremity edema. Pulmonary: positive breath sounds bilaterally, adequate air movement, no wheezing, rhonchi or rales. Gastrointestinal. Abdomen with no organomegaly, non tender, no rebound or guarding Skin. No rashes Musculoskeletal: no joint deformities Right ne[phrotostomy tube in place, clear urine.     Data Reviewed: I have personally reviewed following labs and imaging studies  CBC: Recent Labs  Lab 04/09/19 0751 04/10/19 0515  WBC 8.5 20.4*  HGB 10.3* 9.3*  HCT 30.8* 28.5*  MCV 86.5 89.1  PLT 292 355   Basic Metabolic Panel: Recent Labs  Lab 04/09/19 1708 04/10/19 0515 04/11/19 0539  NA 129* 129* 129*  K 5.2* 4.9 4.6  CL 102 97* 101  CO2 9* 15* 14*  GLUCOSE 108* 152* 97  BUN 135* 138* 130*  CREATININE 4.60* 4.78* 4.70*  CALCIUM 9.9 9.2 8.4*   GFR: Estimated Creatinine Clearance: 9.9 mL/min (A) (by C-G formula based on SCr of 4.7 mg/dL (H)). Liver Function Tests: No results for input(s): AST, ALT, ALKPHOS, BILITOT, PROT, ALBUMIN in the last 168 hours. No results for input(s): LIPASE, AMYLASE in the last 168 hours. No results for input(s): AMMONIA in the last 168 hours. Coagulation Profile: Recent Labs  Lab 04/09/19 0751  INR 1.0   Cardiac Enzymes: No results for input(s): CKTOTAL, CKMB, CKMBINDEX, TROPONINI in the last 168 hours. BNP (last 3 results) No results for input(s): PROBNP in the last 8760 hours. HbA1C: No results for input(s): HGBA1C in the last 72 hours. CBG: Recent Labs  Lab 04/09/19 2104 04/10/19 1203 04/10/19 1639 04/10/19 2144 04/11/19 0736  GLUCAP 136* 165* 165* 123* 106*   Lipid Profile: No results for input(s): CHOL, HDL, LDLCALC, TRIG, CHOLHDL, LDLDIRECT in the last 72 hours. Thyroid Function Tests: No results for input(s): TSH, T4TOTAL, FREET4, T3FREE, THYROIDAB in the last 72 hours. Anemia Panel: No results for input(s): VITAMINB12, FOLATE, FERRITIN, TIBC,  IRON, RETICCTPCT in the last 72 hours.    Radiology Studies: I have reviewed all of the imaging during this hospital visit personally     Scheduled Meds: . darifenacin  7.5 mg Oral Daily  . enoxaparin (LOVENOX) injection  30 mg Subcutaneous Q24H  . ferrous sulfate  325 mg Oral BID WC  . folic acid  1 mg Oral Daily  . gabapentin  300 mg Oral Daily  . insulin aspart  0-9 Units Subcutaneous TID WC  . levofloxacin  250 mg Oral Daily  . levothyroxine  75 mcg Oral QAC breakfast  . mirabegron ER  50 mg Oral Daily  . pantoprazole  40 mg Oral BID  . simvastatin  40 mg Oral QHS  . sodium bicarbonate  1,300 mg Oral BID  . vitamin B-12  1,000 mcg Oral Daily   Continuous Infusions: . sodium chloride 75 mL/hr at 04/10/19 2229     LOS: 1 day        Tadhg Eskew Gerome Apley, MD

## 2019-04-12 ENCOUNTER — Inpatient Hospital Stay (HOSPITAL_COMMUNITY): Payer: Medicare Other

## 2019-04-12 DIAGNOSIS — I441 Atrioventricular block, second degree: Secondary | ICD-10-CM

## 2019-04-12 LAB — CBC WITH DIFFERENTIAL/PLATELET
Abs Immature Granulocytes: 0.04 10*3/uL (ref 0.00–0.07)
Basophils Absolute: 0 10*3/uL (ref 0.0–0.1)
Basophils Relative: 0 %
Eosinophils Absolute: 0 10*3/uL (ref 0.0–0.5)
Eosinophils Relative: 1 %
HCT: 26.7 % — ABNORMAL LOW (ref 36.0–46.0)
Hemoglobin: 8.5 g/dL — ABNORMAL LOW (ref 12.0–15.0)
Immature Granulocytes: 1 %
Lymphocytes Relative: 13 %
Lymphs Abs: 0.5 10*3/uL — ABNORMAL LOW (ref 0.7–4.0)
MCH: 28.7 pg (ref 26.0–34.0)
MCHC: 31.8 g/dL (ref 30.0–36.0)
MCV: 90.2 fL (ref 80.0–100.0)
Monocytes Absolute: 0.2 10*3/uL (ref 0.1–1.0)
Monocytes Relative: 7 %
Neutro Abs: 2.8 10*3/uL (ref 1.7–7.7)
Neutrophils Relative %: 78 %
Platelets: 106 10*3/uL — ABNORMAL LOW (ref 150–400)
RBC: 2.96 MIL/uL — ABNORMAL LOW (ref 3.87–5.11)
RDW: 15.5 % (ref 11.5–15.5)
WBC: 3.5 10*3/uL — ABNORMAL LOW (ref 4.0–10.5)
nRBC: 0 % (ref 0.0–0.2)

## 2019-04-12 LAB — GLUCOSE, CAPILLARY
Glucose-Capillary: 131 mg/dL — ABNORMAL HIGH (ref 70–99)
Glucose-Capillary: 150 mg/dL — ABNORMAL HIGH (ref 70–99)
Glucose-Capillary: 74 mg/dL (ref 70–99)
Glucose-Capillary: 103 mg/dL — ABNORMAL HIGH (ref 70–99)
Glucose-Capillary: 89 mg/dL (ref 70–99)
Glucose-Capillary: 94 mg/dL (ref 70–99)

## 2019-04-12 LAB — BASIC METABOLIC PANEL
Anion gap: 12 (ref 5–15)
BUN: 112 mg/dL — ABNORMAL HIGH (ref 8–23)
CO2: 15 mmol/L — ABNORMAL LOW (ref 22–32)
Calcium: 8.3 mg/dL — ABNORMAL LOW (ref 8.9–10.3)
Chloride: 102 mmol/L (ref 98–111)
Creatinine, Ser: 4.88 mg/dL — ABNORMAL HIGH (ref 0.44–1.00)
GFR calc Af Amer: 9 mL/min — ABNORMAL LOW (ref >60)
GFR calc non Af Amer: 8 mL/min — ABNORMAL LOW (ref >60)
Glucose, Bld: 99 mg/dL (ref 70–99)
Potassium: 4.5 mmol/L (ref 3.5–5.1)
Sodium: 129 mmol/L — ABNORMAL LOW (ref 135–145)

## 2019-04-12 MED ORDER — SODIUM BICARBONATE 8.4 % IV SOLN
INTRAVENOUS | Status: AC
  Administered 2019-04-12 – 2019-04-14 (×5): via INTRAVENOUS
  Filled 2019-04-12 (×7): qty 150

## 2019-04-12 MED ORDER — OXYBUTYNIN CHLORIDE 5 MG PO TABS
5.0000 mg | ORAL_TABLET | Freq: Three times a day (TID) | ORAL | Status: DC
  Administered 2019-04-12 – 2019-04-13 (×5): 5 mg via ORAL
  Filled 2019-04-12 (×5): qty 1

## 2019-04-12 NOTE — Progress Notes (Signed)
Patient ID: Erika Fry, female   DOB: 05/10/1946, 73 y.o.   MRN: [id number] Pt currently asleep BP 128/74  HR 97 temp 99.1 Creat 4.88(4.7), WBC 3.5, hgb 8.5(8.6), urine cx -neg, blood cx-neg Rt PCN output 225 cc + today cloudy yellow urine, 1.23 liters yesterday Renal US today-1. Mild right hydronephrosis, significantly improved compared to the 04/09/2019 percutaneous nephrostomy procedure images. 2. Nonobstructing bilateral nephrolithiasis. 3. Echogenic normal size kidneys, compatible with reported history of nonspecific acute renal parenchymal disease. 4. Small simple left renal cysts.  No suspicious renal masses. 5. Suggestion of diffuse bladder wall thickening, suggest correlation with urinalysis. No significant bladder distention  A/P: Patient with history of nephrolithiasis and recurrent symptomatic right hydronephrosis following stent removal; status post right nephrostomy on 04/09/2019; continue current treatment per [facility]; would have urology follow-up with patient while in house

## 2019-04-12 NOTE — Progress Notes (Signed)
Pt c/o jaw pain claiming that it was a new pain that she was experiencing.  MD across the hall with another pt at the time and was made aware and came to assess pt in the room.  She told MD that she had been having intermittent jaw pain for a few days.  No c/o chest pain, vitals stable.  PRN pain meds given. RN will monitor.

## 2019-04-12 NOTE — Progress Notes (Signed)
PROGRESS NOTE    Erika Fry  UXL:[id number] DOB: 1946/07/04 DOA: 04/09/2019 PCP: [name], MD    Brief Narrative:  73 year old female who presents with severe right flank pain post nephrostomy tube insertion, she does have chronic pelvic pain related to interstitial cystitis.Past medical history of chronic kidney disease stage IV, type 2 diabetes mellitus, dyslipidemia, complete AV block status post dual-chamber pacemaker, diastolic heart failure, hypothyroidism and anemia.On herinitial physical examinationherblood pressurewas134/69, heart rate 112, respiratory rate24, oxygen saturation 98%, her lungswereclear to auscultation bilaterally she does have asystolic murmur, abdomen is soft nontender, nonpitting lower extremity edema. Right nephrostomy tube in place. White cell count is 8.5, hemoglobin 10.3, hematocrit is 30.8, platelets 292.Sodium 129, potassium 5.2, chloride 102, bicarb 9, glucose 108, BUN 135, creatinine 4.6 anion gap 18.  Patient was admitted to the hospital with the working diagnosis of intractable flank pain complicated by AKI on CKD stage 4.   Assessment & Plan:   Principal Problem:   Hydronephrosis Active Problems:   Mobitz type 2 second degree AV block   CKD (chronic kidney disease) stage 4, GFR 15-29 ml/min (HCC)   Diet-controlled diabetes mellitus (HCC)   Essential hypertension   Interstitial cystitis   AKI (acute kidney injury) ([place])   1. AKI on ckd stage 4, complicated with hyponatremia, hyperkaelmia and anion gap metabolic acidosis. Patient with worsening renal function with serum cr at 4,88 with persistent non gap metabolic acidosis, will change fluids for bicarb infusion and will follow renal panel in am, check renal ultrasound. Urine output 1.215 per nephrostomy tube but no bladder output. K is 4,5 with Na at 129. Free water restriction.   2. Intractable back pain at the site of nephrostomy tube, on chronic pelvic pain,  interstitial cystitis. This morning with worsening pain, will continue analgesia with IV hydromorphone, po oxycodone and will add oxybutynin for bladder spasm. Continue darifenacin.   3. Right hydronephrosis. SP nephrostomy tube, adequate urine output, improved leukocytosis, cultures have remained with no growth, will discontinue antibiotic therapy for now.   4. T2DM. Her fasting glucose is 112, will continue glucose cover and monitoring with insulin sliding scale. Poor oral intake.  5. Levothyroxine. Continue with levothyroxine.  6. Deconditioning. Out of bed as tolerated.   DVT prophylaxis:heparin Code Status:full Family Communication:no family at the bedside Disposition Plan/ discharge barriers:pending clinical improvement  Body mass index is 25.47 kg/m. Malnutrition Type:      Malnutrition Characteristics:      Nutrition Interventions:     RN Pressure Injury Documentation:    Consultants:     Procedures:     Antimicrobials:       Subjective: Patient continue to have pelvic pain, sharp in nature, severe in intensity, sensation to empty baldder, severe symptoms, with no nausea or vomiting, no chest pain or dyspnea. No radiation.   Objective: Vitals:   04/11/19 2128 04/12/19 0557 04/12/19 0647 04/12/19 0843  BP: 115/68 96/65 92/60  124/78  Pulse: 99 85 92 89  Resp: 20 (!) 22    Temp: 99.4 F (37.4 C) 98.4 F (36.9 C)    TempSrc: Oral Oral    SpO2: 98% 100%  100%  Weight:      Height:        Intake/Output Summary (Last 24 hours) at 04/12/2019 1023 Last data filed at 04/12/2019 0941 Gross per 24 hour  Intake 480 ml  Output 1090 ml  Net -610 ml   Filed Weights   04/09/19 1609  Weight: 67.3 kg  Examination:   General: deconditioned and in pain.  Neurology: Awake and alert, non focal  E ENT: mild pallor, no icterus, oral mucosa moist Cardiovascular: No JVD. S1-S2 present, rhythmic, no gallops, rubs, or murmurs. ++ non  pitting lower extremity edema. Pulmonary: positive breath sounds bilaterally, adequate air movement, no wheezing, rhonchi or rales. Gastrointestinal. Abdomen with no organomegaly. Tender to palpation at the suprapubic region, with no rebound or guarding Skin. No rashes Musculoskeletal: no joint deformities     Data Reviewed: I have personally reviewed following labs and imaging studies  CBC: Recent Labs  Lab 04/09/19 0751 04/10/19 0515 04/11/19 0539 04/12/19 0440  WBC 8.5 20.4* 4.8 3.5*  NEUTROABS  --   --  4.5 2.8  HGB 10.3* 9.3* 8.6* 8.5*  HCT 30.8* 28.5* 26.6* 26.7*  MCV 86.5 89.1 90.5 90.2  PLT 292 209 142* 161*   Basic Metabolic Panel: Recent Labs  Lab 04/09/19 1708 04/10/19 0515 04/11/19 0539 04/12/19 0440  NA 129* 129* 129* 129*  K 5.2* 4.9 4.6 4.5  CL 102 97* 101 102  CO2 9* 15* 14* 15*  GLUCOSE 108* 152* 97 99  BUN 135* 138* 130* 112*  CREATININE 4.60* 4.78* 4.70* 4.88*  CALCIUM 9.9 9.2 8.4* 8.3*   GFR: Estimated Creatinine Clearance: 9.5 mL/min (A) (by C-G formula based on SCr of 4.88 mg/dL (H)). Liver Function Tests: No results for input(s): AST, ALT, ALKPHOS, BILITOT, PROT, ALBUMIN in the last 168 hours. No results for input(s): LIPASE, AMYLASE in the last 168 hours. No results for input(s): AMMONIA in the last 168 hours. Coagulation Profile: Recent Labs  Lab 04/09/19 0751  INR 1.0   Cardiac Enzymes: No results for input(s): CKTOTAL, CKMB, CKMBINDEX, TROPONINI in the last 168 hours. BNP (last 3 results) No results for input(s): PROBNP in the last 8760 hours. HbA1C: No results for input(s): HGBA1C in the last 72 hours. CBG: Recent Labs  Lab 04/11/19 0736 04/11/19 1159 04/11/19 1636 04/11/19 2120 04/12/19 0722  GLUCAP 106* 106* 101* 112* 74   Lipid Profile: No results for input(s): CHOL, HDL, LDLCALC, TRIG, CHOLHDL, LDLDIRECT in the last 72 hours. Thyroid Function Tests: No results for input(s): TSH, T4TOTAL, FREET4, T3FREE, THYROIDAB  in the last 72 hours. Anemia Panel: No results for input(s): VITAMINB12, FOLATE, FERRITIN, TIBC, IRON, RETICCTPCT in the last 72 hours.    Radiology Studies: I have reviewed all of the imaging during this hospital visit personally     Scheduled Meds: . darifenacin  7.5 mg Oral Daily  . enoxaparin (LOVENOX) injection  30 mg Subcutaneous Q24H  . ferrous sulfate  325 mg Oral BID WC  . folic acid  1 mg Oral Daily  . gabapentin  300 mg Oral Daily  . insulin aspart  0-9 Units Subcutaneous TID WC  . levothyroxine  75 mcg Oral QAC breakfast  . mirabegron ER  50 mg Oral Daily  . oxybutynin  5 mg Oral TID  . pantoprazole  40 mg Oral BID  . simvastatin  40 mg Oral QHS  . vitamin B-12  1,000 mcg Oral Daily   Continuous Infusions: .  sodium bicarbonate  infusion 1000 mL       LOS: 2 days        Hurley Blevins Gerome Apley, MD

## 2019-04-12 NOTE — Evaluation (Signed)
Physical Therapy Evaluation Patient Details Name: Erika Fry MRN: [id number] DOB: [birth year] Today's Date: 04/12/2019   History of Present Illness  73 yo female admitted with hydronephrosis s/p R nephrostomy tube/drain placement. Hx of neuropathy, DM, CKD, obesity, gout, OA, LE edema, osteopenia, venous insufficiency  Clinical Impression  Bed level eval only on today. During session, pt c/o L jaw pain. MD and RN in to assess. RN to medicate as able. Deferred OOB at this time. Pain control has been an issue for pt this admission. Pt did allow UE/LE assessment and she performed bed exercises. Will continue to follow and progress activity as tolerated.     Follow Up Recommendations Home health PT;SNF(depending on progress)    Equipment Recommendations  None recommended by PT    Recommendations for Other Services       Precautions / Restrictions Precautions Precautions: Fall Precaution Comments: R neph tube Restrictions Weight Bearing Restrictions: No      Mobility  Bed Mobility               General bed mobility comments: NT- pt currently c/o severe jaw pain. MD and RN in assessing pt. RN to give pain meds. Deferred OOB at this time  Transfers                    Ambulation/Gait                Stairs            Wheelchair Mobility    Modified Rankin (Stroke Patients Only)       Balance                                             Pertinent Vitals/Pain Pain Assessment: Faces Faces Pain Scale: Hurts whole lot Pain Location: L jaw Pain Intervention(s): Patient requesting pain meds-RN notified    Home Living Family/patient expects to be discharged to:: Private residence Living Arrangements: Alone Available Help at Discharge: Family Type of Home: House Home Access: Stairs to enter Entrance Stairs-Rails: Right Entrance Stairs-Number of Steps: 1 with no railing in front, back is 2 steps with L railing  Home  Layout: Two level;Able to live on main level with bedroom/bathroom Home Equipment: Gilford Rile - 2 wheels;Cane - single point;Wheelchair - [employer];Shower seat      Prior Function Level of Independence: Needs assistance   Gait / Transfers Assistance Needed: independent at baseline           Hand Dominance        Extremity/Trunk Assessment   Upper Extremity Assessment Upper Extremity Assessment: Generalized weakness    Lower Extremity Assessment Lower Extremity Assessment: Generalized weakness    Cervical / Trunk Assessment Cervical / Trunk Assessment: Normal  Communication   Communication: No difficulties  Cognition Arousal/Alertness: Awake/alert Behavior During Therapy: WFL for tasks assessed/performed Overall Cognitive Status: Within Functional Limits for tasks assessed                                        General Comments      Exercises General Exercises - Lower Extremity Ankle Circles/Pumps: AROM;Both;5 reps;Supine Quad Sets: AROM;Both;5 reps;Supine Heel Slides: AROM;Both;5 reps;Supine   Assessment/Plan    PT Assessment Patient needs continued PT services  PT  Problem List Decreased strength;Decreased mobility;Decreased activity tolerance;Decreased balance;Decreased knowledge of use of DME;Pain       PT Treatment Interventions DME instruction;Gait training;Functional mobility training;Therapeutic activities;Balance training;Patient/family education;Therapeutic exercise    PT Goals (Current goals can be found in the Care Plan section)  Acute Rehab PT Goals Patient Stated Goal: less pain PT Goal Formulation: With patient Time For Goal Achievement: 04/26/19 Potential to Achieve Goals: Good    Frequency Min 3X/week   Barriers to discharge        Co-evaluation               AM-PAC PT "6 Clicks" Mobility  Outcome Measure Help needed turning from your back to your side while in a flat bed without using bedrails?: A  Lot Help needed moving from lying on your back to sitting on the side of a flat bed without using bedrails?: A Lot Help needed moving to and from a bed to a chair (including a wheelchair)?: A Lot Help needed standing up from a chair using your arms (e.g., wheelchair or bedside chair)?: A Lot Help needed to walk in hospital room?: A Lot Help needed climbing 3-5 steps with a railing? : Total 6 Click Score: 11    End of Session   Activity Tolerance: Patient limited by pain Patient left: in bed;with call bell/phone within reach;with bed alarm set   PT Visit Diagnosis: Muscle weakness (generalized) (M62.81);Pain;Difficulty in walking, not elsewhere classified (R26.2)    Time: 4818-5631 PT Time Calculation (min) (ACUTE ONLY): 10 min   Charges:   PT Evaluation $PT Eval Moderate Complexity: [place], PT Acute Rehabilitation Services Pager: [phone] Office: [phone]

## 2019-04-13 LAB — PROTEIN / CREATININE RATIO, URINE
Creatinine, Urine: 32.26 mg/dL
Protein Creatinine Ratio: 4.62 mg/mg{Cre} — ABNORMAL HIGH (ref 0.00–0.15)
Total Protein, Urine: 149 mg/dL

## 2019-04-13 LAB — URINALYSIS, ROUTINE W REFLEX MICROSCOPIC
Bilirubin Urine: NEGATIVE
Glucose, UA: 50 mg/dL — AB
Ketones, ur: NEGATIVE mg/dL
Nitrite: NEGATIVE
Protein, ur: 100 mg/dL — AB
Specific Gravity, Urine: 1.009 (ref 1.005–1.030)
WBC, UA: >50 WBC/hpf — ABNORMAL HIGH (ref 0–5)
pH: 5 (ref 5.0–8.0)

## 2019-04-13 LAB — BASIC METABOLIC PANEL
Anion gap: 13 (ref 5–15)
Anion gap: 15 (ref 5–15)
BUN: 119 mg/dL — ABNORMAL HIGH (ref 8–23)
BUN: 108 mg/dL — ABNORMAL HIGH (ref 8–23)
CO2: 16 mmol/L — ABNORMAL LOW (ref 22–32)
CO2: 18 mmol/L — ABNORMAL LOW (ref 22–32)
Calcium: 8.4 mg/dL — ABNORMAL LOW (ref 8.9–10.3)
Calcium: 8.2 mg/dL — ABNORMAL LOW (ref 8.9–10.3)
Chloride: 99 mmol/L (ref 98–111)
Chloride: 96 mmol/L — ABNORMAL LOW (ref 98–111)
Creatinine, Ser: 5.03 mg/dL — ABNORMAL HIGH (ref 0.44–1.00)
Creatinine, Ser: 4.9 mg/dL — ABNORMAL HIGH (ref 0.44–1.00)
GFR calc Af Amer: 9 mL/min — ABNORMAL LOW (ref >60)
GFR calc Af Amer: 9 mL/min — ABNORMAL LOW (ref >60)
GFR calc non Af Amer: 8 mL/min — ABNORMAL LOW (ref >60)
GFR calc non Af Amer: 8 mL/min — ABNORMAL LOW (ref >60)
Glucose, Bld: 109 mg/dL — ABNORMAL HIGH (ref 70–99)
Glucose, Bld: 112 mg/dL — ABNORMAL HIGH (ref 70–99)
Potassium: 4.3 mmol/L (ref 3.5–5.1)
Potassium: 3.8 mmol/L (ref 3.5–5.1)
Sodium: 128 mmol/L — ABNORMAL LOW (ref 135–145)
Sodium: 129 mmol/L — ABNORMAL LOW (ref 135–145)

## 2019-04-13 LAB — GLUCOSE, CAPILLARY
Glucose-Capillary: 103 mg/dL — ABNORMAL HIGH (ref 70–99)
Glucose-Capillary: 121 mg/dL — ABNORMAL HIGH (ref 70–99)
Glucose-Capillary: 127 mg/dL — ABNORMAL HIGH (ref 70–99)
Glucose-Capillary: 105 mg/dL — ABNORMAL HIGH (ref 70–99)

## 2019-04-13 MED ORDER — SODIUM CHLORIDE 0.9 % IV BOLUS
500.0000 mL | Freq: Once | INTRAVENOUS | Status: AC
  Administered 2019-04-13: 500 mL via INTRAVENOUS

## 2019-04-13 NOTE — TOC Initial Note (Signed)
Transition of Care [facility]) - Initial/Assessment Note    Patient Details  Name: Erika Fry MRN: [id number] Date of Birth: [birth year]  Transition of Care [facility]) CM/SW Contact:    Dessa Phi, RN Phone Number: 04/13/2019, 3:33 PM  Clinical Narrative: Recc for SNF-patient declines SNF prefers [facility] already following rep Ronalee Belts aware.[email or web address] dtr n law Jennifer Stegall [phone] left message.                  Expected Discharge Plan: [place] Barriers to Discharge: No Barriers Identified   Patient Goals and CMS Choice Patient states their goals for this hospitalization and ongoing recovery are:: go home CMS Medicare.gov Compare Post Acute Care list provided to:: Patient Choice offered to / list presented to : Patient  Expected Discharge Plan and Services Expected Discharge Plan: [place]   Discharge Planning Services: CM Consult   Living arrangements for the past 2 months: Single Family Home Expected Discharge Date: 04/09/19                                    Prior Living Arrangements/Services Living arrangements for the past 2 months: Single Family Home Lives with:: Self Patient language and need for interpreter reviewed:: Yes Do you feel safe going back to the place where you live?: Yes      Need for Family Participation in Patient Care: No (Comment) Care giver support system in place?: Yes (comment) Current home services: DME(rw) Criminal Activity/Legal Involvement Pertinent to Current Situation/Hospitalization: No - Comment as needed  Activities of Daily Living Home Assistive Devices/Equipment: Wheelchair, Environmental consultant (specify type), Cane (specify quad or straight), Eyeglasses ADL Screening (condition at time of admission) Patient's cognitive ability adequate to safely complete daily activities?: Yes Is the patient deaf or have difficulty hearing?: No Does the patient have difficulty seeing, even when wearing  glasses/contacts?: No Does the patient have difficulty concentrating, remembering, or making decisions?: No Patient able to express need for assistance with ADLs?: Yes Does the patient have difficulty dressing or bathing?: No Independently performs ADLs?: Yes (appropriate for developmental age) Does the patient have difficulty walking or climbing stairs?: No Weakness of Legs: Both Weakness of Arms/Hands: Both  Permission Sought/Granted Permission sought to share information with : Case Manager Permission granted to share information with : Yes, Verbal Permission Granted  Share Information with NAME: Nia Nathaniel  Permission granted to share info w AGENCY: [facility]  Permission granted to share info w Relationship: dtr in law  Permission granted to share info w Contact Information: [phone]  Emotional Assessment Appearance:: Appears stated age Attitude/Demeanor/Rapport: Engaged Affect (typically observed): Accepting Orientation: : Oriented to Self, Oriented to Place, Oriented to  Time, Oriented to Situation Alcohol / Substance Use: Never Used Psych Involvement: No (comment)  Admission diagnosis:  Hydronephrosis [N13.30] Patient Active Problem List   Diagnosis Date Noted  . AKI (acute kidney injury) ([place]) 04/10/2019  . Hydronephrosis 04/09/2019  . Interstitial cystitis 04/09/2019  . Bilateral leg edema 02/25/2019  . Chronic heart failure with preserved ejection fraction ([place]) 02/24/2019  . Cardiac pacemaker in situ 02/24/2019  . ARF (acute renal failure) ([place]) 02/10/2019  . Peripheral edema   . Fever 01/17/2019  . Acute lower UTI 01/17/2019  . Pressure injury of skin 01/17/2019  . Gross hematuria 01/14/2019  . Fluid retention   . Generalized edema   . Hypokalemia   .  Acute right-sided low back pain with right-sided sciatica   . Acute blood loss anemia   . Anemia of chronic disease   . Thrombocytopenia ([place])   . Diabetes mellitus ([place])   . Morbid obesity ([place])   .  Benign essential HTN   . Hypoalbuminemia due to protein-calorie malnutrition ([place])   . Occult blood in stools   . Gastric polyp   . Lumbar discitis 05/17/2018  . Blood loss anemia 05/12/2018  . Hyperlipidemia associated with type 2 diabetes mellitus ([place]) 05/10/2018  . Essential hypertension 04/29/2018  . CKD (chronic kidney disease) stage 4, GFR 15-29 ml/min (HCC)   . Diet-controlled diabetes mellitus ([place])   . GERD (gastroesophageal reflux disease)   . Gout   . High cholesterol   . Hypothyroidism   . Iron deficiency anemia   . AV block, Mobitz 2 03/25/2018  . Mobitz type 2 second degree AV block 03/20/2018  . Aortic stenosis 03/20/2018  . Exertional dyspnea 03/19/2018   PCP:  [name], MD Pharmacy:   [facility], [address] [address] Phone: [phone] Fax: [phone]     Social Determinants of Health (SDOH) Interventions    Readmission Risk Interventions Readmission Risk Prevention Plan 04/13/2019  Transportation Screening Complete  Medication Review ([place]) Complete  PCP or Specialist appointment within 3-5 days of discharge Complete  HRI or [place] Complete  SW Recovery Care/Counseling Consult Complete  [place] Patient Refused  Some recent data might be hidden

## 2019-04-13 NOTE — Plan of Care (Signed)
  Problem: Education: Goal: Knowledge of General Education information will improve Description Including pain rating scale, medication(s)/side effects and non-pharmacologic comfort measures Outcome: Progressing   

## 2019-04-13 NOTE — Progress Notes (Signed)
Physical Therapy Treatment Patient Details Name: Erika Fry MRN: [id number] DOB: 10/07/1946 Today's Date: 04/13/2019    History of Present Illness 73 yo female admitted with hydronephrosis s/p R nephrostomy tube/drain placement. Hx of neuropathy, DM, CKD, obesity, gout, OA, LE edema, osteopenia, venous insufficiency    PT Comments    Progressing slowly with mobility. Max encouragement required for participation. Pt continues to c/o generalized pain and discomfort. Will continue to follow and progress activity as tolerated. PT recommendation has been updated to SNF.     Follow Up Recommendations  SNF     Equipment Recommendations  None recommended by PT    Recommendations for Other Services       Precautions / Restrictions Precautions Precautions: Fall Precaution Comments: R neph tube Restrictions Weight Bearing Restrictions: No    Mobility  Bed Mobility Overal bed mobility: Needs Assistance Bed Mobility: Supine to Sit;Sit to Supine     Supine to sit: Mod assist;HOB elevated Sit to supine: Mod assist;HOB elevated   General bed mobility comments: Assist for trunk and bil LEs. Utilized bedpad for scooting, postioning. Increased time and max encouragement required.   Transfers Overall transfer level: Needs assistance Equipment used: Rolling walker (2 wheeled) Transfers: Sit to/from Stand Sit to Stand: Mod assist;From elevated surface         General transfer comment: Assist to rise, stabilize, control descent. VCs safety, technique, hand placement. Increased time.   Ambulation/Gait             General Gait Details: Pt took 2 very small side steps along the side of the bed. RW used. She was unable to attempt any pivotal or ambulatory steps with +1 assist on today.    Stairs             Wheelchair Mobility    Modified Rankin (Stroke Patients Only)       Balance Overall balance assessment: Needs assistance Sitting-balance support:  Bilateral upper extremity supported;Feet supported Sitting balance-Leahy Scale: Fair   Postural control: Right lateral lean Standing balance support: Bilateral upper extremity supported Standing balance-Leahy Scale: Poor                              Cognition Arousal/Alertness: Awake/alert Behavior During Therapy: WFL for tasks assessed/performed Overall Cognitive Status: Within Functional Limits for tasks assessed                                 General Comments: pt has not been very motivated to mobilize. She c/o pain more often than not      Exercises      General Comments        Pertinent Vitals/Pain Pain Assessment: Faces Faces Pain Scale: Hurts whole lot Pain Location: generalized "all over" Pain Descriptors / Indicators: Grimacing;Moaning;Discomfort Pain Intervention(s): Limited activity within patient's tolerance;Repositioned    Home Living                      Prior Function            PT Goals (current goals can now be found in the care plan section) Progress towards PT goals: Progressing toward goals    Frequency    Min 3X/week      PT Plan Current plan remains appropriate    Co-evaluation  AM-PAC PT "6 Clicks" Mobility   Outcome Measure  Help needed turning from your back to your side while in a flat bed without using bedrails?: A Lot Help needed moving from lying on your back to sitting on the side of a flat bed without using bedrails?: A Lot Help needed moving to and from a bed to a chair (including a wheelchair)?: A Lot Help needed standing up from a chair using your arms (e.g., wheelchair or bedside chair)?: A Lot Help needed to walk in hospital room?: A Lot Help needed climbing 3-5 steps with a railing? : Total 6 Click Score: 11    End of Session Equipment Utilized During Treatment: Gait belt Activity Tolerance: Patient limited by fatigue;Patient limited by pain Patient left: in  bed;with call bell/phone within reach;with bed alarm set   PT Visit Diagnosis: Muscle weakness (generalized) (M62.81);Difficulty in walking, not elsewhere classified (R26.2)     Time: 1020-1050 PT Time Calculation (min) (ACUTE ONLY): 30 min  Charges:  $Therapeutic Activity: 23-37 mins                        Weston Anna, PT Acute Rehabilitation Services Pager: [phone] Office: [phone] ;

## 2019-04-13 NOTE — Care Management Important Message (Signed)
Important Message  Patient Details IM Letter given to Dessa Phi RN to present to the Patient Name: Erika Fry MRN: [id number] Date of Birth: Mar 04, 1946   Medicare Important Message Given:  Yes    Kerin Salen 04/13/2019, 10:21 AM

## 2019-04-13 NOTE — Progress Notes (Signed)
PROGRESS NOTE    Erika Fry  VFI:[id number] DOB: 1946-10-09 DOA: 04/09/2019 PCP: [name], MD    Brief Narrative:  73 year old female who presents with severe right flank pain post nephrostomy tube insertion, she does have chronic pelvic pain related to interstitial cystitis.Past medical history of chronic kidney disease stage IV, type 2 diabetes mellitus, dyslipidemia, complete AV block status post dual-chamber pacemaker, diastolic heart failure, hypothyroidism and anemia.On herinitial physical examinationherblood pressurewas134/69, heart rate 112, respiratory rate24, oxygen saturation 98%, her lungswereclear to auscultation bilaterally she does have asystolic murmur, abdomen is soft nontender, nonpitting lower extremity edema. Right nephrostomy tube in place. White cell count is 8.5, hemoglobin 10.3, hematocrit is 30.8, platelets 292.Sodium 129, potassium 5.2, chloride 102, bicarb 9, glucose 108, BUN 135, creatinine 4.6 anion gap 18.  Patient was admitted to the hospital with the working diagnosis of intractable flank pain complicated by AKI on CKD stage 4.   Assessment & Plan:   Principal Problem:   Hydronephrosis Active Problems:   Mobitz type 2 second degree AV block   CKD (chronic kidney disease) stage 4, GFR 15-29 ml/min (HCC)   Diet-controlled diabetes mellitus (HCC)   Essential hypertension   Interstitial cystitis   AKI (acute kidney injury) ([place])  1. AKI on ckd stage 4, complicated with hyponatremia, hyperkaelmia anion gap metabolic acidosis.continue with  worsening renal function with serum cr at 5.0 from 4,88. K at 4,3 and serum Na at 128. Clinically patient is hypovolemic, with dry mucous membranes and poor oral intake. Will placed foley catheter, bolus 500 cc ml saline and increase bicarb drip to 100 ml per H and will follow 2 pm BMP. Check protein/ creatinine ratio and UA.    2. Intractable back pain at the site of nephrostomy tube, on  chronic pelvic pain, interstitial cystitis.Continue with analgesiawith IV hydromorphone, as needed oraloxycodone, oxybutynin and darifenacin. Will follow with urology recommendations, regarding further pain control.   3. Right hydronephrosis. SP nephrostomy tube, She continues to have a clear urine per nephrostomy tube, 1,695 ml. Will continue pain control and catheter care.   4. T2DM. Glucose cover and monitoring with insulin sliding scale. Her fasting glucose this am is 109 mg/dl.  5. Levothyroxine.Onlevothyroxine.  6. Deconditioning. follow with physical therapy recommendations, patient is very weak and deconditioned.   DVT prophylaxis:heparinsq Code Status:full Family Communication:no family at the bedside Disposition Plan/ discharge barriers:pending clinical improvement  Body mass index is 25.47 kg/m. Malnutrition Type:      Malnutrition Characteristics:      Nutrition Interventions:     RN Pressure Injury Documentation:    Consultants:     Procedures:     Antimicrobials:   Patient completed course of levofloxacin     Subjective: Patient continue to have pelvic pain, moderate to severe, improved with IV analgesics, no nausea or vomiting, but decreases po intake, no chest pain or dyspnea.   Objective: Vitals:   04/12/19 1346 04/12/19 1643 04/12/19 2120 04/13/19 0433  BP: 128/74 132/73 116/75 115/66  Pulse: 97 89 98 93  Resp: (!) 21 18 18  (!) 21  Temp: 99.1 F (37.3 C) 98.6 F (37 C) 97.8 F (36.6 C) 97.6 F (36.4 C)  TempSrc: Oral Oral Oral Oral  SpO2: 100% 95% 99% 96%  Weight:      Height:        Intake/Output Summary (Last 24 hours) at 04/13/2019 1013 Last data filed at 04/13/2019 0939 Gross per 24 hour  Intake 1229.52 ml  Output 1770 ml  Net -540.48 ml   Filed Weights   04/09/19 1609  Weight: 67.3 kg    Examination:   General: Not in pain or dyspnea, deconditioned and in pain.  Neurology: Awake and alert, non  focal  E ENT: mild pallor, no icterus, oral mucosa dry Cardiovascular: No JVD. S1-S2 present, rhythmic, no gallops, rubs, or murmurs. +/++ non pitting lower extremity edema. Pulmonary: positive breath sounds bilaterally, adequate air movement, no wheezing, rhonchi or rales. Gastrointestinal. Abdomen withno organomegaly, no rebound or guarding, tender to palpation at the suprapubic region.  Skin. No rashes Musculoskeletal: no joint deformities     Data Reviewed: I have personally reviewed following labs and imaging studies  CBC: Recent Labs  Lab 04/09/19 0751 04/10/19 0515 04/11/19 0539 04/12/19 0440  WBC 8.5 20.4* 4.8 3.5*  NEUTROABS  --   --  4.5 2.8  HGB 10.3* 9.3* 8.6* 8.5*  HCT 30.8* 28.5* 26.6* 26.7*  MCV 86.5 89.1 90.5 90.2  PLT 292 209 142* 242*   Basic Metabolic Panel: Recent Labs  Lab 04/09/19 1708 04/10/19 0515 04/11/19 0539 04/12/19 0440 04/13/19 0509  NA 129* 129* 129* 129* 128*  K 5.2* 4.9 4.6 4.5 4.3  CL 102 97* 101 102 99  CO2 9* 15* 14* 15* 16*  GLUCOSE 108* 152* 97 99 109*  BUN 135* 138* 130* 112* 119*  CREATININE 4.60* 4.78* 4.70* 4.88* 5.03*  CALCIUM 9.9 9.2 8.4* 8.3* 8.4*   GFR: Estimated Creatinine Clearance: 9.2 mL/min (A) (by C-G formula based on SCr of 5.03 mg/dL (H)). Liver Function Tests: No results for input(s): AST, ALT, ALKPHOS, BILITOT, PROT, ALBUMIN in the last 168 hours. No results for input(s): LIPASE, AMYLASE in the last 168 hours. No results for input(s): AMMONIA in the last 168 hours. Coagulation Profile: Recent Labs  Lab 04/09/19 0751  INR 1.0   Cardiac Enzymes: No results for input(s): CKTOTAL, CKMB, CKMBINDEX, TROPONINI in the last 168 hours. BNP (last 3 results) No results for input(s): PROBNP in the last 8760 hours. HbA1C: No results for input(s): HGBA1C in the last 72 hours. CBG: Recent Labs  Lab 04/12/19 0722 04/12/19 1134 04/12/19 1645 04/12/19 2117 04/13/19 0759  GLUCAP 74 103* 89 94 103*   Lipid  Profile: No results for input(s): CHOL, HDL, LDLCALC, TRIG, CHOLHDL, LDLDIRECT in the last 72 hours. Thyroid Function Tests: No results for input(s): TSH, T4TOTAL, FREET4, T3FREE, THYROIDAB in the last 72 hours. Anemia Panel: No results for input(s): VITAMINB12, FOLATE, FERRITIN, TIBC, IRON, RETICCTPCT in the last 72 hours.    Radiology Studies: I have reviewed all of the imaging during this hospital visit personally     Scheduled Meds: . darifenacin  7.5 mg Oral Daily  . enoxaparin (LOVENOX) injection  30 mg Subcutaneous Q24H  . ferrous sulfate  325 mg Oral BID WC  . folic acid  1 mg Oral Daily  . gabapentin  300 mg Oral Daily  . insulin aspart  0-9 Units Subcutaneous TID WC  . levothyroxine  75 mcg Oral QAC breakfast  . mirabegron ER  50 mg Oral Daily  . oxybutynin  5 mg Oral TID  . pantoprazole  40 mg Oral BID  . simvastatin  40 mg Oral QHS  . vitamin B-12  1,000 mcg Oral Daily   Continuous Infusions: .  sodium bicarbonate  infusion 1000 mL 100 mL/hr at 04/13/19 0955     LOS: 3 days        Namir Neto Gerome Apley, MD

## 2019-04-13 NOTE — Evaluation (Signed)
Occupational Therapy Evaluation Patient Details Name: Erika Fry MRN: [id number] DOB: 1946/10/31 Today's Date: 04/13/2019    History of Present Illness 73 yo female admitted with hydronephrosis s/p R nephrostomy tube/drain placement. Hx of neuropathy, DM, CKD, obesity, gout, OA, LE edema, osteopenia, venous insufficiency   Clinical Impression   Pt was admitted for the above. At baseline, she is mod I and children get groceries for her. She currently needs mod A for bed mobility and up to total A for LB dressing; pain all over is limiting her. Will follow in acute setting with min A level goals.    Follow Up Recommendations  SNF    Equipment Recommendations  3 in 1 bedside commode    Recommendations for Other Services       Precautions / Restrictions Precautions Precautions: Fall Precaution Comments: R neph tube Restrictions Weight Bearing Restrictions: No      Mobility Bed Mobility Overal bed mobility: Needs Assistance Bed Mobility: Supine to Sit;Sit to Supine Rolling: Mod assist Sidelying to sit: Mod assist Supine to sit: Mod assist;HOB elevated Sit to supine: Mod assist;HOB elevated   General bed mobility comments: Assist for trunk and bil LEs. Utilized bedpad for scooting, postioning. Increased time and max encouragement required.   Transfers BY PT Overall transfer level: Needs assistance Equipment used: Rolling walker (2 wheeled) Transfers: Sit to/from Stand Sit to Stand: Mod assist;From elevated surface         General transfer comment: not performed with OT    Balance Overall balance assessment: Needs assistance Sitting-balance support: Bilateral upper extremity supported;Feet supported Sitting balance-Leahy Scale: Fair   Postural control: Right lateral lean Standing balance support: Bilateral upper extremity supported Standing balance-Leahy Scale: Poor                             ADL either performed or assessed with clinical  judgement   ADL Overall ADL's : Needs assistance/impaired Eating/Feeding: Set up   Grooming: Oral care;Minimal assistance;Sitting   Upper Body Bathing: Minimal assistance   Lower Body Bathing: Maximal assistance   Upper Body Dressing : Minimal assistance   Lower Body Dressing: Total assistance                 General ADL Comments: sat EOB and performed grooming tasks.       Vision         Perception     Praxis      Pertinent Vitals/Pain Pain Assessment: Faces Faces Pain Scale: Hurts even more Pain Location: all over Pain Descriptors / Indicators: Grimacing;Moaning;Discomfort Pain Intervention(s): Limited activity within patient's tolerance;Monitored during session;Premedicated before session;Repositioned     Hand Dominance     Extremity/Trunk Assessment Upper Extremity Assessment Upper Extremity Assessment: Generalized weakness           Communication Communication Communication: No difficulties   Cognition Arousal/Alertness: Awake/alert Behavior During Therapy: WFL for tasks assessed/performed Overall Cognitive Status: Within Functional Limits for tasks assessed                                 General Comments: pt has not been very motivated to mobilize. She c/o pain more often than not   General Comments  pt with neuropathy x 4 extremities. Has difficulty opening containers    Exercises     Shoulder Instructions      Home Living Family/patient expects to be  discharged to:: Private residence Living Arrangements: [place] Shower/Tub: Teacher, early years/pre: Handicapped height     Home Equipment: Environmental consultant - 2 wheels;Cane - single point;Wheelchair - [employer];Shower seat          Prior Functioning/Environment          Comments: mod I with adls/iadls.  Sleeps in recliner        OT Problem List: Decreased strength;Decreased activity tolerance;Impaired balance  (sitting and/or standing);Decreased knowledge of use of DME or AE;Pain;Impaired UE functional use;Impaired sensation      OT Treatment/Interventions: Self-care/ADL training;Energy conservation;DME and/or AE instruction;Therapeutic exercise;Therapeutic activities;Patient/family education;Balance training    OT Goals(Current goals can be found in the care plan section) Acute Rehab OT Goals Patient Stated Goal: less pain OT Goal Formulation: With patient Time For Goal Achievement: 04/27/19 Potential to Achieve Goals: Good ADL Goals Pt Will Transfer to Toilet: with min assist;bedside commode;stand pivot transfer Pt Will Perform Toileting - Clothing Manipulation and hygiene: with min assist;sit to/from stand Additional ADL Goal #1: pt will perform LB adls with min A with AE, sit to stand Additional ADL Goal #2: pt will initiate at least one rest break without cues for energy conservation  OT Frequency: Min 2X/week   Barriers to D/C:            Co-evaluation              AM-PAC OT "6 Clicks" Daily Activity     Outcome Measure Help from another person eating meals?: A Little Help from another person taking care of personal grooming?: A Little Help from another person toileting, which includes using toliet, bedpan, or urinal?: A Lot Help from another person bathing (including washing, rinsing, drying)?: A Lot Help from another person to put on and taking off regular upper body clothing?: A Little Help from another person to put on and taking off regular lower body clothing?: Total 6 Click Score: 14   End of Session    Activity Tolerance: Patient tolerated treatment well Patient left: in bed;with call bell/phone within reach;with bed alarm set  OT Visit Diagnosis: Muscle weakness (generalized) (M62.81)                Time: 4163-8453 OT Time Calculation (min): 22 min Charges:  OT General Charges $OT Visit: 1 Visit OT Evaluation $OT Eval Moderate Complexity: 1 Mod  Lesle Chris, OTR/L Acute Rehabilitation Services [phone] [facility] pager [phone] office 04/13/2019  [place] 04/13/2019, 2:34 PM

## 2019-04-13 NOTE — Consult Note (Signed)
Urology Consult   Physician requesting consult: Jacqulynn Cadet  Reason for consult: Bladder pain  History of Present Illness: Erika Fry is a 73 y.o. female with PMH significant for arthritis, CKD IV, gout, right hydroureteronephrosis due to UPJ obstruction, interstitial cystitis, HTN, and DM II who was admitted 04/10/19 for pain control after placement of right perc nephrostomy tube by IR.  She was also noted to have AKI on CKD stage 4.    Pt previously had right ureteral stent for treatment of hydro, however, she could not tolerate it due to pain, therefore, it was removed and she was scheduled for neph tube. She also has a hx of IC that has been difficult to treat.  She has tried multiple treatments for this including anticholinergics, bladder instillations, and gabapentin with minimal improvement in sx. She also had hydrodistention without improvement.   When asked where her pain is she points to the suprapubic area.  I specifically asked if she had pain at the neph tube site and she said no.  When asked a second time she said "it only hurts here" and pointed to the suprapubic area. She denies F/C/N/V and diarrhea/constipation. The pt is very sleepy and I have to wake her up multiple times to have a conversation.  She falls asleep mid-sentence.    Past Medical History:  Diagnosis Date  . Arthritis    "knees, thumbs" (03/25/2018)  . CKD (chronic kidney disease), stage IV ([place])   . Diet-controlled diabetes mellitus ([place])   . GERD (gastroesophageal reflux disease)   . Gout    "on daily RX" (03/25/2018)  . Heart murmur   . High cholesterol   . Hypertension   . Hypothyroidism   . Iron deficiency anemia    "had to get an iron infusion"  . Migraine    "used to have them growing up" (03/25/2018)  . Presence of permanent cardiac pacemaker 03/25/2018    Past Surgical History:  Procedure Laterality Date  . APPENDECTOMY    . BIOPSY  05/19/2018   Procedure: BIOPSY;  Surgeon: Ladene Artist, MD;  Location: [place];  Service: Endoscopy;;  . Consuela Mimes W/ RETROGRADES Bilateral 01/14/2019   Procedure: CYSTOSCOPY WITH RETROGRADE PYELOGRAM BILATERAL HYDRODISTENTION;  Surgeon: Ardis Hughs, MD;  Location: [facility];  Service: Urology;  Laterality: Bilateral;  . ESOPHAGOGASTRODUODENOSCOPY N/A 05/19/2018   Procedure: ESOPHAGOGASTRODUODENOSCOPY (EGD);  Surgeon: Ladene Artist, MD;  Location[facility];  Service: Endoscopy;  Laterality: N/A;  . ESOPHAGOGASTRODUODENOSCOPY (EGD) WITH PROPOFOL N/A 01/28/2019   Procedure: ESOPHAGOGASTRODUODENOSCOPY (EGD) WITH PROPOFOL;  Surgeon: Rush Landmark Telford Nab., MD;  Location: [place];  Service: Gastroenterology;  Laterality: N/A;  . INSERT / REPLACE / REMOVE PACEMAKER  03/25/2018  . IR FLUORO GUIDE CV LINE RIGHT  05/18/2018  . IR LUMBAR DISC ASPIRATION W/IMG GUIDE  05/15/2018  . IR NEPHROSTOMY PLACEMENT RIGHT  04/09/2019  . IR REMOVAL TUN CV CATH W/O FL  07/23/2018  . IR US GUIDE VASC ACCESS RIGHT  05/18/2018  . KNEE ARTHROSCOPY Bilateral   . PACEMAKER IMPLANT N/A 03/25/2018   Procedure: PACEMAKER IMPLANT;  Surgeon: Evans Lance, MD;  Location: [place] CV LAB;  Service: Cardiovascular;  Laterality: N/A;  . PACEMAKER PLACEMENT Left 03/2018  . POLYPECTOMY  01/28/2019   Procedure: POLYPECTOMY;  Surgeon: Mansouraty, Telford Nab., MD;  Location: [place];  Service: Gastroenterology;;  . RIGHT HEART CATH N/A 03/20/2018   Procedure: RIGHT HEART CATH;  Surgeon: Nigel Mormon, MD;  Location: [facility]  INVASIVE CV LAB;  Service: Cardiovascular;  Laterality: N/A;  . TEE WITHOUT CARDIOVERSION N/A 03/20/2018   Procedure: TRANSESOPHAGEAL ECHOCARDIOGRAM (TEE);  Surgeon: Nigel Mormon, MD;  Location: [facility] ENDOSCOPY;  Service: Cardiovascular;  Laterality: N/A;  . TONSILLECTOMY    . URETEROSCOPY WITH HOLMIUM LASER LITHOTRIPSY Right 01/14/2019   Procedure: URETEROSCOPY WITH HOLMIUM LASER LITHOTRIPSY BLADDER BIOPSIES RIGHT STENT PLACEMENT;   Surgeon: Ardis Hughs, MD;  Location: [facility];  Service: Urology;  Laterality: Right;     Current Hospital Medications:  Home meds:  Current Meds  Medication Sig  . acetaminophen (TYLENOL) 325 MG tablet Take 650 mg by mouth as needed.  . ALOE VERA PO Take 3 capsules by mouth 2 (two) times a day.  [name] Kitchen amitriptyline (ELAVIL) 25 MG tablet Take 25 mg by mouth at bedtime.  . Ascorbic Acid (VITAMIN C PO) Take 1 tablet by mouth 2 (two) times daily.  . Biotin 5 MG CAPS Take 1 capsule (5 mg total) by mouth daily.  . cetirizine (ZYRTEC) 10 MG tablet Take 10 mg by mouth at bedtime.   . ferrous sulfate 325 (65 FE) MG tablet Take 1 tablet (325 mg total) by mouth 2 (two) times daily with a meal.  . folic acid (FOLVITE) 1 MG tablet Take 1 tablet (1 mg total) by mouth daily.  . furosemide (LASIX) 80 MG tablet Take 80 mg by mouth 2 (two) times daily.  [name] Kitchen gabapentin (NEURONTIN) 300 MG capsule Take 300 mg by mouth daily.   [name] Kitchen levothyroxine (SYNTHROID, LEVOTHROID) 75 MCG tablet Take 75 mcg by mouth daily before breakfast.  . mirabegron ER (MYRBETRIQ) 50 MG TB24 tablet Take 50 mg by mouth every morning.   . Multiple Vitamin (MULTI-VITAMIN PO) Take 1 tablet by mouth daily.  . pantoprazole (PROTONIX) 40 MG tablet Take 1 tablet (40 mg total) by mouth 2 (two) times daily for 30 days. (take once daily after 1 month)  . potassium chloride (K-DUR) 10 MEQ tablet Take 1 tablet (10 mEq total) by mouth 2 (two) times daily.  . simvastatin (ZOCOR) 40 MG tablet Take 0.5 tablets (20 mg total) by mouth at bedtime. (Patient taking differently: Take 40 mg by mouth at bedtime. )  . sodium bicarbonate 650 MG tablet Take 2 tablets (1,300 mg total) by mouth 2 (two) times daily.  . solifenacin (VESICARE) 5 MG tablet Take 5 mg by mouth every evening.   . vitamin B-12 (CYANOCOBALAMIN) 1000 MCG tablet Take 1 tablet (1,000 mcg total) by mouth daily.  . [DISCONTINUED] diazepam (VALIUM) 10 MG tablet 10 mg See admin instructions.  Insert 10 mg vaginally at bedtime  . [DISCONTINUED] furosemide (LASIX) 40 MG tablet Take 2 tablets (80 mg total) by mouth 2 (two) times daily.  . [DISCONTINUED] levofloxacin (LEVAQUIN) 250 MG tablet Take 250 mg by mouth daily.     Scheduled Meds: . darifenacin  7.5 mg Oral Daily  . enoxaparin (LOVENOX) injection  30 mg Subcutaneous Q24H  . ferrous sulfate  325 mg Oral BID WC  . folic acid  1 mg Oral Daily  . gabapentin  300 mg Oral Daily  . insulin aspart  0-9 Units Subcutaneous TID WC  . levothyroxine  75 mcg Oral QAC breakfast  . mirabegron ER  50 mg Oral Daily  . oxybutynin  5 mg Oral TID  . pantoprazole  40 mg Oral BID  . simvastatin  40 mg Oral QHS  . vitamin B-12  1,000 mcg Oral Daily   Continuous Infusions: .  sodium bicarbonate  infusion 1000 mL 100 mL/hr at 04/13/19 0955   PRN Meds:.acetaminophen **OR** acetaminophen, HYDROmorphone (DILAUDID) injection, ondansetron **OR** ondansetron (ZOFRAN) IV, phenol  Allergies:  Allergies  Allergen Reactions  . Baclofen Other (See Comments)    somnolence  . Codeine Other (See Comments)    Increases Pain and couldn't sleep    Family History  Problem Relation Age of Onset  . Hypertension Mother   . Diabetes Mellitus II Father   . Heart disease Father   . Heart attack Father   . Gastric cancer Brother   . Diabetes Mellitus II Brother   . Stomach cancer Brother     Social History:  reports that she has never smoked. She has never used smokeless tobacco. She reports previous alcohol use. She reports that she does not use drugs.  ROS: A complete review of systems was performed.  All systems are negative except for pertinent findings as noted.  Physical Exam:  Vital signs in last 24 hours: Temp:  [97.6 F (36.4 C)-99.3 F (37.4 C)] 99.3 F (37.4 C) (05/12 1300) Pulse Rate:  [89-98] 95 (05/12 1300) Resp:  [18-21] 18 (05/12 1300) BP: (99-132)/(62-75) 99/62 (05/12 1300) SpO2:  [91 %-99 %] 91 % (05/12  1300) Constitutional: Sleepy but oriented, No acute distress Cardiovascular: Regular rate and rhythm Respiratory: Normal respiratory effort GI: Abdomen is soft, nondistended, no abdominal masses; she has diffuse abdominal tenderness on exam but more so over suprapubic area GU: 6f foley in place with turbid clear/white urine as well as debris in tubing ; no tenderness with palp around right neph tube which also has turbid whitish urine in the bag; c/d dressing around neph tube  Lymphatic: No lymphadenopathy Neurologic: Grossly intact, no focal deficits Psychiatric: Normal mood and affect  Laboratory Data:  Recent Labs    04/11/19 0539 04/12/19 0440  WBC 4.8 3.5*  HGB 8.6* 8.5*  HCT 26.6* 26.7*  PLT 142* 106*    Recent Labs    04/11/19 0539 04/12/19 0440 04/13/19 0509  NA 129* 129* 128*  K 4.6 4.5 4.3  CL 101 102 99  GLUCOSE 97 99 109*  BUN 130* 112* 119*  CALCIUM 8.4* 8.3* 8.4*  CREATININE 4.70* 4.88* 5.03*     Results for orders placed or performed during the hospital encounter of 04/09/19 (from the past 24 hour(s))  Glucose, capillary     Status: None   Collection Time: 04/12/19  4:45 PM  Result Value Ref Range   Glucose-Capillary 89 70 - 99 mg/dL  Glucose, capillary     Status: None   Collection Time: 04/12/19  9:17 PM  Result Value Ref Range   Glucose-Capillary 94 70 - 99 mg/dL   Comment 1 Notify RN    Comment 2 Document in Chart   Basic metabolic panel     Status: Abnormal   Collection Time: 04/13/19  5:09 AM  Result Value Ref Range   Sodium 128 (L) 135 - 145 mmol/L   Potassium 4.3 3.5 - 5.1 mmol/L   Chloride 99 98 - 111 mmol/L   CO2 16 (L) 22 - 32 mmol/L   Glucose, Bld 109 (H) 70 - 99 mg/dL   BUN 119 (H) 8 - 23 mg/dL   Creatinine, Ser 5.03 (H) 0.44 - 1.00 mg/dL   Calcium 8.4 (L) 8.9 - 10.3 mg/dL   GFR calc non Af Amer 8 (L) >60 mL/min   GFR calc Af Amer 9 (L) >60 mL/min   Anion gap 13  5 - 15  Glucose, capillary     Status: Abnormal   Collection  Time: 04/13/19  7:59 AM  Result Value Ref Range   Glucose-Capillary 103 (H) 70 - 99 mg/dL  Glucose, capillary     Status: Abnormal   Collection Time: 04/13/19 11:33 AM  Result Value Ref Range   Glucose-Capillary 121 (H) 70 - 99 mg/dL  Urinalysis, Routine w reflex microscopic     Status: Abnormal   Collection Time: 04/13/19  1:46 PM  Result Value Ref Range   Color, Urine YELLOW YELLOW   APPearance TURBID (A) CLEAR   Specific Gravity, Urine 1.009 1.005 - 1.030   pH 5.0 5.0 - 8.0   Glucose, UA 50 (A) NEGATIVE mg/dL   Hgb urine dipstick SMALL (A) NEGATIVE   Bilirubin Urine NEGATIVE NEGATIVE   Ketones, ur NEGATIVE NEGATIVE mg/dL   Protein, ur 100 (A) NEGATIVE mg/dL   Nitrite NEGATIVE NEGATIVE   Leukocytes,Ua MODERATE (A) NEGATIVE   RBC / HPF 21-50 0 - 5 RBC/hpf   WBC, UA >50 (H) 0 - 5 WBC/hpf   Bacteria, UA MANY (A) NONE SEEN   WBC Clumps PRESENT    Amorphous Crystal PRESENT    Recent Results (from the past 240 hour(s))  Culture, Urine     Status: None   Collection Time: 04/10/19  1:22 PM  Result Value Ref Range Status   Specimen Description   Final    URINE, CLEAN CATCH Performed at [facility], [place] [address] [place], [place] [address]    Special Requests   Final    NONE Performed at [facility], [place] [address], [place], [place] [address]    Culture   Final    NO GROWTH Performed at [facility], [place] [address], [place], [place] [address]    Report Status 04/11/2019 FINAL  Final    Renal Function: Recent Labs    04/09/19 1708 04/10/19 0515 04/11/19 0539 04/12/19 0440 04/13/19 0509  CREATININE 4.60* 4.78* 4.70* 4.88* 5.03*   Estimated Creatinine Clearance: 9.2 mL/min (A) (by C-G formula based on SCr of 5.03 mg/dL (H)).  Radiologic Imaging: US Renal  Result Date: 04/12/2019 CLINICAL DATA:  Inpatient. Acute kidney injury. Status post percutaneous nephrostomy on the right on 04/09/2019 for symptomatic hydronephrosis  after lithotripsy and ureteral stent removal. EXAM: RENAL / URINARY TRACT ULTRASOUND COMPLETE COMPARISON:  04/09/2019 percutaneous nephrostomy ultrasound and fluoroscopic images. 02/10/2019 CT abdomen/pelvis. FINDINGS: Right Kidney: Renal measurements: 12.7 x 4.9 x 5.2 cm = volume: 167 mL. Mildly echogenic right renal parenchyma, which is normal in thickness. Mild right hydronephrosis, significantly improved since 04/09/2019 percutaneous nephrostomy ultrasound image. Multiple nonobstructing lower right renal stones, largest 8 mm. No right renal mass. Tip of right percutaneous nephrostomy tube not well demonstrated on this scan. Left Kidney: Renal measurements: 11.0 x 4.8 x 4.9 cm = volume: 140 mL. Mildly echogenic left renal parenchyma, which is normal in thickness. No left hydronephrosis. Simple 1.1 x 1.1 x 1.1 cm lower left renal cyst. Nonobstructing 6 mm upper and 5 mm interpolar left renal stones. Bladder: Nondistended bladder with suggestion of diffuse bladder wall thickening. IMPRESSION: 1. Mild right hydronephrosis, significantly improved compared to the 04/09/2019 percutaneous nephrostomy procedure images. 2. Nonobstructing bilateral nephrolithiasis. 3. Echogenic normal size kidneys, compatible with reported history of nonspecific acute renal parenchymal disease. 4. Small simple left renal cysts.  No suspicious renal masses. 5. Suggestion of diffuse bladder wall thickening, suggest correlation with urinalysis. No significant  bladder distention. Electronically Signed   By: Ilona Sorrel M.D.   On: 04/12/2019 13:44    Impression/Recommendation:  Interstitial cystitis--complicated case.  Continue analgesia per IM.  She is currently receiving enablex, myrbetriq, and oxybutynin.  She is not likely having bladder spasms and the multiple anticholinergics do not appear to be improving her symptoms but will cause constipation/additional abdominal pain particularly in pt also getting multiple narcotics.  Suggest  d/c oxybutynin to try to prevent this. Continue gabapentin. F/U urine culture and treat if positive.  Consider lumbar MRI to eval for possible neurologic issue that may be contributing to her pain.   Right hydro--maintain neph tube. Per IR  Will f/u with pt in our office after d/c.  Please call with additional questions.   Debbrah Alar 04/13/2019, 3:06 PM

## 2019-04-14 DIAGNOSIS — N131 Hydronephrosis with ureteral stricture, not elsewhere classified: Secondary | ICD-10-CM

## 2019-04-14 LAB — CBC WITH DIFFERENTIAL/PLATELET
Abs Immature Granulocytes: 0.05 10*3/uL (ref 0.00–0.07)
Basophils Absolute: 0 10*3/uL (ref 0.0–0.1)
Basophils Relative: 1 %
Eosinophils Absolute: 0.1 10*3/uL (ref 0.0–0.5)
Eosinophils Relative: 2 %
HCT: 23.9 % — ABNORMAL LOW (ref 36.0–46.0)
Hemoglobin: 7.7 g/dL — ABNORMAL LOW (ref 12.0–15.0)
Immature Granulocytes: 1 %
Lymphocytes Relative: 13 %
Lymphs Abs: 0.8 10*3/uL (ref 0.7–4.0)
MCH: 28.4 pg (ref 26.0–34.0)
MCHC: 32.2 g/dL (ref 30.0–36.0)
MCV: 88.2 fL (ref 80.0–100.0)
Monocytes Absolute: 0.5 10*3/uL (ref 0.1–1.0)
Monocytes Relative: 8 %
Neutro Abs: 4.7 10*3/uL (ref 1.7–7.7)
Neutrophils Relative %: 75 %
Platelets: 73 10*3/uL — ABNORMAL LOW (ref 150–400)
RBC: 2.71 MIL/uL — ABNORMAL LOW (ref 3.87–5.11)
RDW: 15.4 % (ref 11.5–15.5)
WBC: 6.1 10*3/uL (ref 4.0–10.5)
nRBC: 0 % (ref 0.0–0.2)

## 2019-04-14 LAB — GLUCOSE, CAPILLARY
Glucose-Capillary: 148 mg/dL — ABNORMAL HIGH (ref 70–99)
Glucose-Capillary: 118 mg/dL — ABNORMAL HIGH (ref 70–99)
Glucose-Capillary: 151 mg/dL — ABNORMAL HIGH (ref 70–99)
Glucose-Capillary: 136 mg/dL — ABNORMAL HIGH (ref 70–99)

## 2019-04-14 LAB — BASIC METABOLIC PANEL
Anion gap: 12 (ref 5–15)
BUN: 104 mg/dL — ABNORMAL HIGH (ref 8–23)
CO2: 25 mmol/L (ref 22–32)
Calcium: 8 mg/dL — ABNORMAL LOW (ref 8.9–10.3)
Chloride: 92 mmol/L — ABNORMAL LOW (ref 98–111)
Creatinine, Ser: 4.66 mg/dL — ABNORMAL HIGH (ref 0.44–1.00)
GFR calc Af Amer: 10 mL/min — ABNORMAL LOW (ref >60)
GFR calc non Af Amer: 9 mL/min — ABNORMAL LOW (ref >60)
Glucose, Bld: 151 mg/dL — ABNORMAL HIGH (ref 70–99)
Potassium: 3.5 mmol/L (ref 3.5–5.1)
Sodium: 129 mmol/L — ABNORMAL LOW (ref 135–145)

## 2019-04-14 MED ORDER — HEPARIN SODIUM (PORCINE) 5000 UNIT/ML IJ SOLN
5000.0000 [IU] | Freq: Three times a day (TID) | INTRAMUSCULAR | Status: DC
  Administered 2019-04-14 – 2019-04-21 (×21): 5000 [IU] via SUBCUTANEOUS
  Filled 2019-04-14 (×21): qty 1

## 2019-04-14 MED ORDER — OXYCODONE HCL 5 MG PO TABS
5.0000 mg | ORAL_TABLET | Freq: Four times a day (QID) | ORAL | Status: DC | PRN
  Administered 2019-04-14 – 2019-04-17 (×7): 5 mg via ORAL
  Filled 2019-04-14 (×8): qty 1

## 2019-04-14 NOTE — Progress Notes (Addendum)
Referring Physician(s): Herrick,Benjamin W  Supervising Physician: Markus Daft  Patient Status:  [facility] - In-pt  Chief Complaint:  Right flank/suprapubic pain  Subjective: Pt doing fair; states most of pain is in suprapubic region with some mild right flank discomfort.  Tolerating diet okay.   Allergies: Baclofen and Codeine  Medications: Prior to Admission medications   Medication Sig Start Date End Date Taking? Authorizing Provider  acetaminophen (TYLENOL) 325 MG tablet Take 650 mg by mouth as needed.   Yes [provider]  ALOE VERA PO Take 3 capsules by mouth 2 (two) times a day.   Yes [provider]  amitriptyline (ELAVIL) 25 MG tablet Take 25 mg by mouth at bedtime. 03/29/19  Yes [provider]  Ascorbic Acid (VITAMIN C PO) Take 1 tablet by mouth 2 (two) times daily.   Yes [provider]  Biotin 5 MG CAPS Take 1 capsule (5 mg total) by mouth daily. 01/14/19  Yes Ardis Hughs, MD  cetirizine (ZYRTEC) 10 MG tablet Take 10 mg by mouth at bedtime.    Yes [provider]  ferrous sulfate 325 (65 FE) MG tablet Take 1 tablet (325 mg total) by mouth 2 (two) times daily with a meal. 06/22/18  Yes Love, Ivan Anchors, PA-C  folic acid (FOLVITE) 1 MG tablet Take 1 tablet (1 mg total) by mouth daily. 06/22/18  Yes Love, Ivan Anchors, PA-C  furosemide (LASIX) 80 MG tablet Take 80 mg by mouth 2 (two) times daily. 03/05/19  Yes [provider]  gabapentin (NEURONTIN) 300 MG capsule Take 300 mg by mouth daily.  02/09/19  Yes [provider]  levothyroxine (SYNTHROID, LEVOTHROID) 75 MCG tablet Take 75 mcg by mouth daily before breakfast.   Yes [provider]  mirabegron ER (MYRBETRIQ) 50 MG TB24 tablet Take 50 mg by mouth every morning.    Yes [provider]  Multiple Vitamin (MULTI-VITAMIN PO) Take 1 tablet by mouth daily.   Yes [provider]  pantoprazole (PROTONIX) 40 MG tablet Take 1 tablet (40 mg  total) by mouth 2 (two) times daily for 30 days. (take once daily after 1 month) 01/29/19 04/09/19 Yes Elodia Florence., MD  potassium chloride (K-DUR) 10 MEQ tablet Take 1 tablet (10 mEq total) by mouth 2 (two) times daily. 02/19/19  Yes Georgette Shell, MD  simvastatin (ZOCOR) 40 MG tablet Take 0.5 tablets (20 mg total) by mouth at bedtime. Patient taking differently: Take 40 mg by mouth at bedtime.  06/22/18  Yes Love, Ivan Anchors, PA-C  sodium bicarbonate 650 MG tablet Take 2 tablets (1,300 mg total) by mouth 2 (two) times daily. 02/19/19  Yes Georgette Shell, MD  solifenacin (VESICARE) 5 MG tablet Take 5 mg by mouth every evening.  02/04/19  Yes [provider]  vitamin B-12 (CYANOCOBALAMIN) 1000 MCG tablet Take 1 tablet (1,000 mcg total) by mouth daily. 06/22/18  Yes Love, Ivan Anchors, PA-C  colchicine 0.6 MG tablet Take 1 tablet (0.6 mg total) by mouth daily. Patient not taking: Reported on 04/09/2019 02/19/19   Georgette Shell, MD  fluticasone [facility]) 50 MCG/ACT nasal spray Place 2 sprays into both nostrils daily as needed for allergies or rhinitis.    [provider]  Hydrocortisone (GERHARDT'S BUTT CREAM) CREA Apply 1 application topically 4 (four) times daily. Patient not taking: Reported on 04/09/2019 02/19/19   Georgette Shell, MD  predniSONE (DELTASONE) 10 MG tablet Take 3 tabs daily for 3  days then 2 tabs daily for 3 days then 1 tab daily till done Patient not taking: Reported on 04/09/2019 02/19/19   Georgette Shell, MD     Vital Signs: BP 108/63 (BP Location: Right Arm)   Pulse 85   Temp 98.5 F (36.9 C) (Oral)   Resp (!) 22   Ht 5\' 4"  (1.626 m)   Wt 148 lb 5.9 oz (67.3 kg)   SpO2 96%   BMI 25.47 kg/m   Physical Exam right nephrostomy tube intact, insertion site okay, mild to mildly tender to palpation, output 1.3 L yesterday, 350 cc of yellow urine today; drain flushed without difficulty  Imaging: US Renal  Result Date:  04/12/2019 CLINICAL DATA:  Inpatient. Acute kidney injury. Status post percutaneous nephrostomy on the right on 04/09/2019 for symptomatic hydronephrosis after lithotripsy and ureteral stent removal. EXAM: RENAL / URINARY TRACT ULTRASOUND COMPLETE COMPARISON:  04/09/2019 percutaneous nephrostomy ultrasound and fluoroscopic images. 02/10/2019 CT abdomen/pelvis. FINDINGS: Right Kidney: Renal measurements: 12.7 x 4.9 x 5.2 cm = volume: 167 mL. Mildly echogenic right renal parenchyma, which is normal in thickness. Mild right hydronephrosis, significantly improved since 04/09/2019 percutaneous nephrostomy ultrasound image. Multiple nonobstructing lower right renal stones, largest 8 mm. No right renal mass. Tip of right percutaneous nephrostomy tube not well demonstrated on this scan. Left Kidney: Renal measurements: 11.0 x 4.8 x 4.9 cm = volume: 140 mL. Mildly echogenic left renal parenchyma, which is normal in thickness. No left hydronephrosis. Simple 1.1 x 1.1 x 1.1 cm lower left renal cyst. Nonobstructing 6 mm upper and 5 mm interpolar left renal stones. Bladder: Nondistended bladder with suggestion of diffuse bladder wall thickening. IMPRESSION: 1. Mild right hydronephrosis, significantly improved compared to the 04/09/2019 percutaneous nephrostomy procedure images. 2. Nonobstructing bilateral nephrolithiasis. 3. Echogenic normal size kidneys, compatible with reported history of nonspecific acute renal parenchymal disease. 4. Small simple left renal cysts.  No suspicious renal masses. 5. Suggestion of diffuse bladder wall thickening, suggest correlation with urinalysis. No significant bladder distention. Electronically Signed   By: Ilona Sorrel M.D.   On: 04/12/2019 13:44    Labs:  CBC: Recent Labs    04/10/19 0515 04/11/19 0539 04/12/19 0440 04/14/19 0550  WBC 20.4* 4.8 3.5* 6.1  HGB 9.3* 8.6* 8.5* 7.7*  HCT 28.5* 26.6* 26.7* 23.9*  PLT 209 142* 106* 73*    COAGS: Recent Labs    05/13/18 0427  05/14/18 1151 04/09/19 0751  INR 1.01 1.01 1.0  APTT 25  --  30    BMP: Recent Labs    04/12/19 0440 04/13/19 0509 04/13/19 1531 04/14/19 0550  NA 129* 128* 129* 129*  K 4.5 4.3 3.8 3.5  CL 102 99 96* 92*  CO2 15* 16* 18* 25  GLUCOSE 99 109* 112* 151*  BUN 112* 119* 108* 104*  CALCIUM 8.3* 8.4* 8.2* 8.0*  CREATININE 4.88* 5.03* 4.90* 4.66*  GFRNONAA 8* 8* 8* 9*  GFRAA 9* 9* 9* 10*    LIVER FUNCTION TESTS: Recent Labs    02/15/19 0242 02/16/19 0814 02/17/19 0508 02/18/19 0452  BILITOT 0.5 0.8 0.9 0.4  AST 12* 12* 11* 16  ALT 7 9 7 7   ALKPHOS 84 84 80 69  PROT 6.3* 6.0* 6.8 6.7  ALBUMIN 2.5*  2.5* 2.4*  2.4* 3.3*  3.3* 4.1  4.1    Assessment and Plan: Patient with history of CKD, nephrolithiasis, recurrent symptomatic right hydronephrosis following stent removal and interstitial cystitis; status post right nephrostomy on 04/09/2019; febrile, BP  okay, creatinine 4.66 down slightly from 4.9, 7.7 down from 8.5, platelets 73K,  urine culture pending, blood culture negative; plans as outlined by [facility] and urology. Renal consult.  If hgb cont trend downwards consider f/u CT A/P. She will need 6-week routine exchange of nephrostomy otherwise.   Electronically Signed: D. Rowe Robert, PA-C 04/14/2019, 3:12 PM   I spent a total of 15 minutes at the the patient's bedside AND on the patient's hospital floor or unit, greater than 50% of which was counseling/coordinating care for right nephrostomy    Patient ID: Erika Fry, female   DOB: 10-03-1946, 73 y.o.   MRN: [id number]

## 2019-04-14 NOTE — Progress Notes (Signed)
PROGRESS NOTE    Erika Fry  OVZ:[id number] DOB: 07/05/1946 DOA: 04/09/2019 PCP: [name], MD    Brief Narrative:   73 year old female with history of chronic interstitial cystitis, chronic pelvic pain, chronic kidney disease due to hypertension stage IV with baseline creatinine about 3.6, type 2 diabetes on insulin, complete heart block status post dual-chamber pacemaker, diastolic heart failure hypothyroidism and anemia who was brought to interventional radiology for right nephrostomy tube placement and found to have acute kidney injury as well as severe persistent pain so she was admitted to the hospital.  She has been followed by urology.  Assessment & Plan:   Principal Problem:   Hydronephrosis Active Problems:   Mobitz type 2 second degree AV block   CKD (chronic kidney disease) stage 4, GFR 15-29 ml/min (HCC)   Diet-controlled diabetes mellitus (HCC)   Essential hypertension   Interstitial cystitis   AKI (acute kidney injury) ([place])  Acute kidney injury on stage IV chronic kidney disease complicated with hyponatremia, hyperkalemia and metabolic acidosis: Treated with bicarbonate infusion.  Some improvement of renal functions today.  BUN remains more than 100.  Potassium normalized.  Patient clinically euvolemic today.  She had Foley catheter placed on 04/13/2019 and is draining freely.  Her nephrostomy tube is draining. Will discuss with nephrology about her AKI on CKD management.  Intractable back pain, pelvic pain with interstitial cystitis: Treated with multiple agents including oxycodone, oxybutynin and darifenacin.  She is using mostly IV Dilaudid, will encourage oral pain medications and discontinue injectables.  Advance activities.  Ambulate. Urine was abnormal.  Cultures are pending. Patient has always been growing enterococcus.  Holding off on antibiotics at this time.  Will await until final urine culture.  Right-sided hydronephrosis with UPJ obstruction:  Obstruction due to?  Interstitial cystitis.  Nephrostomy tube draining well.  Ambulate.  Foley catheter was placed suspecting of more obstruction.  Will give voiding trial tomorrow.  Mobitz type II second-degree AV block status post pacemaker: Is stable.  Diet controlled type 2 diabetes: On sliding scale in the hospital.  Hypothyroidism: On Synthroid.  Euthyroid.   DVT prophylaxis: Heparin subcu Code Status: Full code Family Communication: Patient to communicate with family Disposition Plan: Inpatient   Consultants:   Urology, signed off for outpatient follow-up  Nephrology, consult called and case discussed today.  Procedures:   Right nephrostomy by IR  Antimicrobials:   None   Subjective: Patient seen and examined.  Continues to complain of lower abdominal pain mostly in the suprapubic area and back pain.  Remains afebrile.  Objective: Vitals:   04/13/19 1142 04/13/19 1300 04/13/19 2125 04/14/19 0429  BP:  99/62 121/66 (!) 104/58  Pulse:  95 94 85  Resp:  18    Temp:  99.3 F (37.4 C) 98.3 F (36.8 C) 98.7 F (37.1 C)  TempSrc:  Oral Oral Oral  SpO2: 97% 91% 99% 100%  Weight:      Height:        Intake/Output Summary (Last 24 hours) at 04/14/2019 1411 Last data filed at 04/14/2019 1135 Gross per 24 hour  Intake 1593.75 ml  Output 1625 ml  Net -31.25 ml   Filed Weights   04/09/19 1609  Weight: 67.3 kg    Examination:  General exam: Appears anxious and in moderate distress with pain.    Respiratory system: Clear to auscultation. Respiratory effort normal. Cardiovascular system: S1 & S2 heard, RRR. No JVD, murmurs, rubs, gallops or clicks. No pedal edema.  Pacemaker  present.  Nontender. Gastrointestinal system: Abdomen is nondistended, soft . No organomegaly or masses felt. Normal bowel sounds heard.  Has some voluntary guarding and pain along the suprapubic area. Central nervous system: Alert and oriented. No focal neurological deficits. Extremities:  Symmetric 5 x 5 power. Skin: No rashes, lesions or ulcers Psychiatry: Judgement and insight appear normal.  She is anxious.    Data Reviewed: I have personally reviewed following labs and imaging studies  CBC: Recent Labs  Lab 04/09/19 0751 04/10/19 0515 04/11/19 0539 04/12/19 0440 04/14/19 0550  WBC 8.5 20.4* 4.8 3.5* 6.1  NEUTROABS  --   --  4.5 2.8 4.7  HGB 10.3* 9.3* 8.6* 8.5* 7.7*  HCT 30.8* 28.5* 26.6* 26.7* 23.9*  MCV 86.5 89.1 90.5 90.2 88.2  PLT 292 209 142* 106* 73*   Basic Metabolic Panel: Recent Labs  Lab 04/11/19 0539 04/12/19 0440 04/13/19 0509 04/13/19 1531 04/14/19 0550  NA 129* 129* 128* 129* 129*  K 4.6 4.5 4.3 3.8 3.5  CL 101 102 99 96* 92*  CO2 14* 15* 16* 18* 25  GLUCOSE 97 99 109* 112* 151*  BUN 130* 112* 119* 108* 104*  CREATININE 4.70* 4.88* 5.03* 4.90* 4.66*  CALCIUM 8.4* 8.3* 8.4* 8.2* 8.0*   GFR: Estimated Creatinine Clearance: 10 mL/min (A) (by C-G formula based on SCr of 4.66 mg/dL (H)). Liver Function Tests: No results for input(s): AST, ALT, ALKPHOS, BILITOT, PROT, ALBUMIN in the last 168 hours. No results for input(s): LIPASE, AMYLASE in the last 168 hours. No results for input(s): AMMONIA in the last 168 hours. Coagulation Profile: Recent Labs  Lab 04/09/19 0751  INR 1.0   Cardiac Enzymes: No results for input(s): CKTOTAL, CKMB, CKMBINDEX, TROPONINI in the last 168 hours. BNP (last 3 results) No results for input(s): PROBNP in the last 8760 hours. HbA1C: No results for input(s): HGBA1C in the last 72 hours. CBG: Recent Labs  Lab 04/13/19 1133 04/13/19 1637 04/13/19 2121 04/14/19 0719 04/14/19 1158  GLUCAP 121* 127* 105* 148* 118*   Lipid Profile: No results for input(s): CHOL, HDL, LDLCALC, TRIG, CHOLHDL, LDLDIRECT in the last 72 hours. Thyroid Function Tests: No results for input(s): TSH, T4TOTAL, FREET4, T3FREE, THYROIDAB in the last 72 hours. Anemia Panel: No results for input(s): VITAMINB12, FOLATE,  FERRITIN, TIBC, IRON, RETICCTPCT in the last 72 hours. Sepsis Labs: No results for input(s): PROCALCITON, LATICACIDVEN in the last 168 hours.  Recent Results (from the past 240 hour(s))  Culture, Urine     Status: None   Collection Time: 04/10/19  1:22 PM  Result Value Ref Range Status   Specimen Description   Final    URINE, CLEAN CATCH Performed at [facility] [address], [place], [place] [address]    Special Requests   Final    NONE Performed at [facility], [place] [address], [place], [place] [address]    Culture   Final    NO GROWTH Performed at [facility], [place] [address], [place], [place] [address]    Report Status 04/11/2019 FINAL  Final  Culture, Urine     Status: None (Preliminary result)   Collection Time: 04/13/19  1:46 PM  Result Value Ref Range Status   Specimen Description   Final    URINE, CATHETERIZED Performed at [facility], [place] [address], [place], [place] [address]    Special Requests   Final    Normal Performed at [facility], [place]  [address], [place], [place] [address]    Culture   Final    CULTURE REINCUBATED FOR BETTER GROWTH Performed at [facility], [place] [address] [place], [place] [address]    Report Status PENDING  Incomplete         Radiology Studies: No results found.      Scheduled Meds: . darifenacin  7.5 mg Oral Daily  . ferrous sulfate  325 mg Oral BID WC  . folic acid  1 mg Oral Daily  . gabapentin  300 mg Oral Daily  . heparin  5,000 Units Subcutaneous Q8H  . insulin aspart  0-9 Units Subcutaneous TID WC  . levothyroxine  75 mcg Oral QAC breakfast  . mirabegron ER  50 mg Oral Daily  . pantoprazole  40 mg Oral BID  . simvastatin  40 mg Oral QHS  . vitamin B-12  1,000 mcg Oral Daily   Continuous Infusions: .  sodium bicarbonate  infusion 1000 mL 100 mL/hr at 04/14/19 0445     LOS: 4 days    Time spent: 25  minutes    Barb Merino, MD [facility] Pager [phone]  If 7PM-7AM, please contact night-coverage [email or web address] Password [facility] 04/14/2019, 2:11 PM

## 2019-04-14 NOTE — Consult Note (Signed)
[facility] Renal Consultation Note  Requesting MD: Ghimire  Indication for Consultation:  AKI on CKD  Chief complaint: right flank pain  HPI:  Erika Fry is a 73 y.o. female with a history of CKD Stage IV, right hydroureteronephrosis due to UPJ obstruction, nephrolithiasis, gout, HTN, DM, and complete AV block s/p dual chamber pacemaker who was admitted to the hospital for pain control after placement of right perc nephrostomy tube by IR.  She has a complex recent urologic history and previously had a right ureteral stent for treatment of hydro however she did not tolerate this 2/2 pain and it was removed and she was referred to IR for the nephrostomy tube.  Other  urologic history includes right sided lithotripsy, stone extraction, ureteral stent placement, and hydrodistension therapy for chronic pelvic pain and interstitial cystitis per chart review.    She is followed by Dr. Johnney Ou.  Baseline Cr had been 2.5 - 3 but has risen to 3- 3.5 over the past couple of months and Cr was 4.6 on presentation with a BUN of 135.  IR placed the right nephrostomy tube.on 5/8.  She has been on bicarb gtt.  Her BUN has improved to 104 and Cr 4.66 on consult after peaking at 5.03.  She had 1.6 liters UOP over 5/12.  She states that she has discussed dialysis with her providers previously and though she hopes to remain off if possible that she would consent if indicated.  In the past has had issues with fluid overload which did not compromise her respiratory status.   PMHx:   Past Medical History:  Diagnosis Date  . Arthritis    "knees, thumbs" (03/25/2018)  . CKD (chronic kidney disease), stage IV ([place])   . Diet-controlled diabetes mellitus ([place])   . GERD (gastroesophageal reflux disease)   . Gout    "on daily RX" (03/25/2018)  . Heart murmur   . High cholesterol   . Hypertension   . Hypothyroidism   . Iron deficiency anemia    "had to get an iron infusion"  . Migraine    "used  to have them growing up" (03/25/2018)  . Presence of permanent cardiac pacemaker 03/25/2018    Past Surgical History:  Procedure Laterality Date  . APPENDECTOMY    . BIOPSY  05/19/2018   Procedure: BIOPSY;  Surgeon: Ladene Artist, MD;  Location: [facility];  Service: Endoscopy;;  . Consuela Mimes W/ RETROGRADES Bilateral 01/14/2019   Procedure: CYSTOSCOPY WITH RETROGRADE PYELOGRAM BILATERAL HYDRODISTENTION;  Surgeon: Ardis Hughs, MD;  Location: [facility];  Service: Urology;  Laterality: Bilateral;  . ESOPHAGOGASTRODUODENOSCOPY N/A 05/19/2018   Procedure: ESOPHAGOGASTRODUODENOSCOPY (EGD);  Surgeon: Ladene Artist, MD;  Location: [facility] ENDOSCOPY;  Service: Endoscopy;  Laterality: N/A;  . ESOPHAGOGASTRODUODENOSCOPY (EGD) WITH PROPOFOL N/A 01/28/2019   Procedure: ESOPHAGOGASTRODUODENOSCOPY (EGD) WITH PROPOFOL;  Surgeon: Rush Landmark Telford Nab., MD;  Location: [place];  Service: Gastroenterology;  Laterality: N/A;  . INSERT / REPLACE / REMOVE PACEMAKER  03/25/2018  . IR FLUORO GUIDE CV LINE RIGHT  05/18/2018  . IR LUMBAR DISC ASPIRATION W/IMG GUIDE  05/15/2018  . IR NEPHROSTOMY PLACEMENT RIGHT  04/09/2019  . IR REMOVAL TUN CV CATH W/O FL  07/23/2018  . IR US GUIDE VASC ACCESS RIGHT  05/18/2018  . KNEE ARTHROSCOPY Bilateral   . PACEMAKER IMPLANT N/A 03/25/2018   Procedure: PACEMAKER IMPLANT;  Surgeon: Evans Lance, MD;  Location: [facility] CV [facility];  Service: Cardiovascular;  Laterality: N/A;  . PACEMAKER  PLACEMENT Left 03/2018  . POLYPECTOMY  01/28/2019   Procedure: POLYPECTOMY;  Surgeon: Mansouraty, Telford Nab., MD;  Location: [facility];  Service: Gastroenterology;;  . RIGHT HEART CATH N/A 03/20/2018   Procedure: RIGHT HEART CATH;  Surgeon: Nigel Mormon, MD;  Location: [facility];  Service: Cardiovascular;  Laterality: N/A;  . TEE WITHOUT CARDIOVERSION N/A 03/20/2018   Procedure: TRANSESOPHAGEAL ECHOCARDIOGRAM (TEE);  Surgeon: Nigel Mormon, MD;  Location: [facility]  ENDOSCOPY;  Service: Cardiovascular;  Laterality: N/A;  . TONSILLECTOMY    . URETEROSCOPY WITH HOLMIUM LASER LITHOTRIPSY Right 01/14/2019   Procedure: URETEROSCOPY WITH HOLMIUM LASER LITHOTRIPSY BLADDER BIOPSIES RIGHT STENT PLACEMENT;  Surgeon: Ardis Hughs, MD;  Location: [facility];  Service: Urology;  Laterality: Right;    Family Hx:  Family History  Problem Relation Age of Onset  . Hypertension Mother   . Diabetes Mellitus II Father   . Heart disease Father   . Heart attack Father   . Gastric cancer Brother   . Diabetes Mellitus II Brother   . Stomach cancer Brother     Social History:  reports that she has never smoked. She has never used smokeless tobacco. She reports previous alcohol use. She reports that she does not use drugs.  Allergies:  Allergies  Allergen Reactions  . Baclofen Other (See Comments)    somnolence  . Codeine Other (See Comments)    Increases Pain and couldn't sleep    Medications: Prior to Admission medications   Medication Sig Start Date End Date Taking? Authorizing Provider  acetaminophen (TYLENOL) 325 MG tablet Take 650 mg by mouth as needed.   Yes [provider]  ALOE VERA PO Take 3 capsules by mouth 2 (two) times a day.   Yes [provider]  amitriptyline (ELAVIL) 25 MG tablet Take 25 mg by mouth at bedtime. 03/29/19  Yes [provider]  Ascorbic Acid (VITAMIN C PO) Take 1 tablet by mouth 2 (two) times daily.   Yes [provider]  Biotin 5 MG CAPS Take 1 capsule (5 mg total) by mouth daily. 01/14/19  Yes Ardis Hughs, MD  cetirizine (ZYRTEC) 10 MG tablet Take 10 mg by mouth at bedtime.    Yes [provider]  ferrous sulfate 325 (65 FE) MG tablet Take 1 tablet (325 mg total) by mouth 2 (two) times daily with a meal. 06/22/18  Yes Love, Ivan Anchors, PA-C  folic acid (FOLVITE) 1 MG tablet Take 1 tablet (1 mg total) by mouth daily. 06/22/18  Yes Love, Ivan Anchors, PA-C  furosemide (LASIX) 80 MG tablet  Take 80 mg by mouth 2 (two) times daily. 03/05/19  Yes [provider]  gabapentin (NEURONTIN) 300 MG capsule Take 300 mg by mouth daily.  02/09/19  Yes [provider]  levothyroxine (SYNTHROID, LEVOTHROID) 75 MCG tablet Take 75 mcg by mouth daily before breakfast.   Yes [provider]  mirabegron ER (MYRBETRIQ) 50 MG TB24 tablet Take 50 mg by mouth every morning.    Yes [provider]  Multiple Vitamin (MULTI-VITAMIN PO) Take 1 tablet by mouth daily.   Yes [provider]  pantoprazole (PROTONIX) 40 MG tablet Take 1 tablet (40 mg total) by mouth 2 (two) times daily for 30 days. (take once daily after 1 month) 01/29/19 04/09/19 Yes Elodia Florence., MD  potassium chloride (K-DUR) 10 MEQ tablet Take 1 tablet (10 mEq total) by mouth 2 (two) times daily. 02/19/19  Yes Landis Gandy  G, MD  simvastatin (ZOCOR) 40 MG tablet Take 0.5 tablets (20 mg total) by mouth at bedtime. Patient taking differently: Take 40 mg by mouth at bedtime.  06/22/18  Yes Love, Ivan Anchors, PA-C  sodium bicarbonate 650 MG tablet Take 2 tablets (1,300 mg total) by mouth 2 (two) times daily. 02/19/19  Yes Georgette Shell, MD  solifenacin (VESICARE) 5 MG tablet Take 5 mg by mouth every evening.  02/04/19  Yes [provider]  vitamin B-12 (CYANOCOBALAMIN) 1000 MCG tablet Take 1 tablet (1,000 mcg total) by mouth daily. 06/22/18  Yes Love, Ivan Anchors, PA-C  colchicine 0.6 MG tablet Take 1 tablet (0.6 mg total) by mouth daily. Patient not taking: Reported on 04/09/2019 02/19/19   Georgette Shell, MD  fluticasone [facility]) 50 MCG/ACT nasal spray Place 2 sprays into both nostrils daily as needed for allergies or rhinitis.    [provider]  Hydrocortisone (GERHARDT'S BUTT CREAM) CREA Apply 1 application topically 4 (four) times daily. Patient not taking: Reported on 04/09/2019 02/19/19   Georgette Shell, MD  predniSONE (DELTASONE) 10 MG tablet Take 3 tabs daily for 3  days then 2 tabs daily for 3 days then 1 tab daily till done Patient not taking: Reported on 04/09/2019 02/19/19   Georgette Shell, MD    I have reviewed the patient's current medications.  Labs:  BMP Latest Ref Rng & Units 04/14/2019 04/13/2019 04/13/2019  Glucose 70 - 99 mg/dL 151(H) 112(H) 109(H)  BUN 8 - 23 mg/dL 104(H) 108(H) 119(H)  Creatinine 0.44 - 1.00 mg/dL 4.66(H) 4.90(H) 5.03(H)  BUN/Creat Ratio 6 - 22 (calc) - - -  Sodium 135 - 145 mmol/L 129(L) 129(L) 128(L)  Potassium 3.5 - 5.1 mmol/L 3.5 3.8 4.3  Chloride 98 - 111 mmol/L 92(L) 96(L) 99  CO2 22 - 32 mmol/L 25 18(L) 16(L)  Calcium 8.9 - 10.3 mg/dL 8.0(L) 8.2(L) 8.4(L)    Urinalysis    Component Value Date/Time   COLORURINE YELLOW 04/13/2019 1346   APPEARANCEUR TURBID (A) 04/13/2019 1346   LABSPEC 1.009 04/13/2019 1346   PHURINE 5.0 04/13/2019 1346   GLUCOSEU 50 (A) 04/13/2019 1346   HGBUR SMALL (A) 04/13/2019 1346   BILIRUBINUR NEGATIVE 04/13/2019 1346   KETONESUR NEGATIVE 04/13/2019 1346   PROTEINUR 100 (A) 04/13/2019 1346   NITRITE NEGATIVE 04/13/2019 1346   LEUKOCYTESUR MODERATE (A) 04/13/2019 1346     ROS:  Pertinent items noted in HPI and remainder of comprehensive ROS otherwise negative.   Physical Exam: Vitals:   04/14/19 1446 04/14/19 1500  BP: 108/63   Pulse: 85   Resp: (!) 22   Temp: 98.5 F (36.9 C)   SpO2: 100% 96%     General: elderly female in bed in discomfort 2/2 abdominal pain  HEENT: NCAT  Eyes: EOMI; sclera anicteric Neck: supple trachea midline  Heart: S1S2; no rub  Lungs: clear and unlabored  Abdomen: soft; tender to palpation over  Extremities: no pitting edema Skin: no rash on extremities exposed  Neuro: alert and oriented x 3; conversant and follows commands Right neph tube in place with pale yellow urine   Assessment/Plan:  # AKI  - Secondary to obstruction and s/p right percutaneous nephrostomy tube on 5/8 - Continue supportive care for now  - Continue current  liter of bicarb gtt then stop IV fluids and reassess; no overt overload but has had issues in the past and normally on lasix BID at home  - She may ultimately require initiation  of dialysis and would consent to this if indicated.  She is a candidate per primary nephrologist, Dr. Johnney Ou   - Would avoid the myrbetriq with advanced CKD   # CKD stage IV  - hx chronic obstruction and HTN, DM - Baseline Cr has risen to 3 -3.5 recently   # Metabolic acidosis - 2/2 AKI and improved s/p bicarbonate supplementation  - Discontinue bicarb gtt after current liter  # Chronic diastolic CHF  - Stopping continuous fluids.   - Would defer lasix for now   # HTN  - Acceptable control   # Anemia  - Secondary in part to CKD  - No acute indication for PRBC's   # Hyponatremia  - Discontinue fluids after current liter and reassess   # Note abnormal UPEP 01/2019 per charting - SPEP no M spike in 01/2019.  Slightly elevated free light chain ratio at 2.13 in 01/2019  Claudia Desanctis 04/14/2019, 3:12 PM

## 2019-04-15 LAB — URINALYSIS, ROUTINE W REFLEX MICROSCOPIC
Bilirubin Urine: NEGATIVE
Glucose, UA: NEGATIVE mg/dL
Ketones, ur: NEGATIVE mg/dL
Nitrite: NEGATIVE
Protein, ur: 100 mg/dL — AB
RBC / HPF: >50 RBC/hpf — ABNORMAL HIGH (ref 0–5)
Specific Gravity, Urine: 1.01 (ref 1.005–1.030)
WBC, UA: >50 WBC/hpf — ABNORMAL HIGH (ref 0–5)
pH: 6 (ref 5.0–8.0)

## 2019-04-15 LAB — CBC WITH DIFFERENTIAL/PLATELET
Abs Immature Granulocytes: 0.06 10*3/uL (ref 0.00–0.07)
Basophils Absolute: 0 10*3/uL (ref 0.0–0.1)
Basophils Relative: 0 %
Eosinophils Absolute: 0.2 10*3/uL (ref 0.0–0.5)
Eosinophils Relative: 2 %
HCT: 25 % — ABNORMAL LOW (ref 36.0–46.0)
Hemoglobin: 8.2 g/dL — ABNORMAL LOW (ref 12.0–15.0)
Immature Granulocytes: 1 %
Lymphocytes Relative: 18 %
Lymphs Abs: 1.2 10*3/uL (ref 0.7–4.0)
MCH: 29.2 pg (ref 26.0–34.0)
MCHC: 32.8 g/dL (ref 30.0–36.0)
MCV: 89 fL (ref 80.0–100.0)
Monocytes Absolute: 0.6 10*3/uL (ref 0.1–1.0)
Monocytes Relative: 8 %
Neutro Abs: 4.8 10*3/uL (ref 1.7–7.7)
Neutrophils Relative %: 71 %
Platelets: 81 10*3/uL — ABNORMAL LOW (ref 150–400)
RBC: 2.81 MIL/uL — ABNORMAL LOW (ref 3.87–5.11)
RDW: 15.3 % (ref 11.5–15.5)
WBC: 6.8 10*3/uL (ref 4.0–10.5)
nRBC: 0 % (ref 0.0–0.2)

## 2019-04-15 LAB — URINE CULTURE: Special Requests: NORMAL

## 2019-04-15 LAB — BASIC METABOLIC PANEL
Anion gap: 11 (ref 5–15)
BUN: 91 mg/dL — ABNORMAL HIGH (ref 8–23)
CO2: 30 mmol/L (ref 22–32)
Calcium: 7.7 mg/dL — ABNORMAL LOW (ref 8.9–10.3)
Chloride: 89 mmol/L — ABNORMAL LOW (ref 98–111)
Creatinine, Ser: 4.46 mg/dL — ABNORMAL HIGH (ref 0.44–1.00)
GFR calc Af Amer: 11 mL/min — ABNORMAL LOW (ref >60)
GFR calc non Af Amer: 9 mL/min — ABNORMAL LOW (ref >60)
Glucose, Bld: 112 mg/dL — ABNORMAL HIGH (ref 70–99)
Potassium: 3.3 mmol/L — ABNORMAL LOW (ref 3.5–5.1)
Sodium: 130 mmol/L — ABNORMAL LOW (ref 135–145)

## 2019-04-15 LAB — PROCALCITONIN: Procalcitonin: 5.46 ng/mL

## 2019-04-15 LAB — GLUCOSE, CAPILLARY
Glucose-Capillary: 126 mg/dL — ABNORMAL HIGH (ref 70–99)
Glucose-Capillary: 110 mg/dL — ABNORMAL HIGH (ref 70–99)
Glucose-Capillary: 146 mg/dL — ABNORMAL HIGH (ref 70–99)
Glucose-Capillary: 125 mg/dL — ABNORMAL HIGH (ref 70–99)

## 2019-04-15 LAB — LACTIC ACID, PLASMA: Lactic Acid, Venous: 0.9 mmol/L (ref 0.5–1.9)

## 2019-04-15 LAB — PHOSPHORUS: Phosphorus: 2.9 mg/dL (ref 2.5–4.6)

## 2019-04-15 LAB — MAGNESIUM: Magnesium: 1.5 mg/dL — ABNORMAL LOW (ref 1.7–2.4)

## 2019-04-15 MED ORDER — PIPERACILLIN-TAZOBACTAM IN DEX 2-0.25 GM/50ML IV SOLN
2.2500 g | Freq: Four times a day (QID) | INTRAVENOUS | Status: DC
  Filled 2019-04-15: qty 50

## 2019-04-15 MED ORDER — SODIUM CHLORIDE 0.9 % IV SOLN
500.0000 mg | Freq: Two times a day (BID) | INTRAVENOUS | Status: DC
  Administered 2019-04-15 – 2019-04-16 (×3): 500 mg via INTRAVENOUS
  Filled 2019-04-15 (×2): qty 500
  Filled 2019-04-15: qty 0.5
  Filled 2019-04-15: qty 500

## 2019-04-15 MED ORDER — SODIUM CHLORIDE 0.9 % IV SOLN
1.0000 g | Freq: Once | INTRAVENOUS | Status: DC
  Filled 2019-04-15: qty 1

## 2019-04-15 MED ORDER — SODIUM CHLORIDE 0.9 % IV SOLN
1.0000 g | Freq: Once | INTRAVENOUS | Status: AC
  Administered 2019-04-15: 1 g via INTRAVENOUS
  Filled 2019-04-15: qty 1

## 2019-04-15 MED ORDER — PIPERACILLIN-TAZOBACTAM 3.375 G IVPB 30 MIN
3.3750 g | Freq: Once | INTRAVENOUS | Status: DC
  Filled 2019-04-15: qty 50

## 2019-04-15 MED ORDER — SODIUM CHLORIDE 0.9 % IV BOLUS
2000.0000 mL | Freq: Once | INTRAVENOUS | Status: AC
  Administered 2019-04-15: 2000 mL via INTRAVENOUS

## 2019-04-15 MED ORDER — DARBEPOETIN ALFA 150 MCG/0.3ML IJ SOSY
150.0000 ug | PREFILLED_SYRINGE | Freq: Once | INTRAMUSCULAR | Status: AC
  Administered 2019-04-15: 150 ug via SUBCUTANEOUS
  Filled 2019-04-15: qty 0.3

## 2019-04-15 NOTE — Progress Notes (Signed)
Subjective:  Tmax 102.9 overnight  BP borderline and one this AM recorded as 65/40 !! Getting 2 liter bolus  1700 of UOP recorded, crt down slightly.  hgb down to the 7's.. However, pt is non toxic appearing- just c/o dizziness   Objective Vital signs in last 24 hours: Vitals:   04/15/19 0900 04/15/19 1102 04/15/19 1110 04/15/19 1210  BP: (!) 92/52 (!) 74/42 (!) 65/40 (!) 89/46  Pulse:  86 77 75  Resp: 18 12  20   Temp:  98.6 F (37 C)  97.7 F (36.5 C)  TempSrc:  Oral  Oral  SpO2: 94% 91%  91%  Weight:      Height:       Weight change:   Intake/Output Summary (Last 24 hours) at 04/15/2019 1153 Last data filed at 04/15/2019 0600 Gross per 24 hour  Intake 1913.6 ml  Output 1350 ml  Net 563.6 ml    Assessment/ Plan: Pt is a 73 y.o. yo female with stage 4 CKD as OP - recent nephrostomy tube who was admitted on 04/09/2019 with pain from nephrostomy tube   Assessment/Plan: 1. A on CRF- crt lower than it has been in some time, BUN under 100.  Good UOP- no absolute indications for dialysis- difficult to gauge uremic sxms as I suspect never says feels good   2. Anemia- check iron stores and treat with ESA  3. Secondary hyperparathyroidism- phos is low, calc lowish as well- no treatment 4. HTN/volume- BP low- no BP meds- suspect pain meds ?  are keeping it low 5. Fever- urine culture mult species- blood cultures pending - meropenam ordered   Louis Meckel    Labs: Basic Metabolic Panel: Recent Labs  Lab 04/13/19 1531 04/14/19 0550 04/15/19 0534  NA 129* 129* 130*  K 3.8 3.5 3.3*  CL 96* 92* 89*  CO2 18* 25 30  GLUCOSE 112* 151* 112*  BUN 108* 104* 91*  CREATININE 4.90* 4.66* 4.46*  CALCIUM 8.2* 8.0* 7.7*  PHOS  --   --  2.9   Liver Function Tests: No results for input(s): AST, ALT, ALKPHOS, BILITOT, PROT, ALBUMIN in the last 168 hours. No results for input(s): LIPASE, AMYLASE in the last 168 hours. No results for input(s): AMMONIA in the last 168  hours. CBC: Recent Labs  Lab 04/10/19 0515  04/11/19 0539 04/12/19 0440 04/14/19 0550 04/15/19 0534  WBC 20.4*  --  4.8 3.5* 6.1 6.8  NEUTROABS  --    < > 4.5 2.8 4.7 4.8  HGB 9.3*  --  8.6* 8.5* 7.7* 8.2*  HCT 28.5*  --  26.6* 26.7* 23.9* 25.0*  MCV 89.1  --  90.5 90.2 88.2 89.0  PLT 209  --  142* 106* 73* 81*   < > = values in this interval not displayed.   Cardiac Enzymes: No results for input(s): CKTOTAL, CKMB, CKMBINDEX, TROPONINI in the last 168 hours. CBG: Recent Labs  Lab 04/14/19 1158 04/14/19 1647 04/14/19 2248 04/15/19 0718 04/15/19 1139  GLUCAP 118* 151* 136* 126* 110*    Iron Studies: No results for input(s): IRON, TIBC, TRANSFERRIN, FERRITIN in the last 72 hours. Studies/Results: No results found. Medications: Infusions:   Scheduled Medications: . darifenacin  7.5 mg Oral Daily  . ferrous sulfate  325 mg Oral BID WC  . folic acid  1 mg Oral Daily  . gabapentin  300 mg Oral Daily  . heparin  5,000 Units Subcutaneous Q8H  . insulin aspart  0-9 Units Subcutaneous  TID WC  . levothyroxine  75 mcg Oral QAC breakfast  . pantoprazole  40 mg Oral BID  . simvastatin  40 mg Oral QHS  . vitamin B-12  1,000 mcg Oral Daily    have reviewed scheduled and prn medications.  Physical Exam: General: non toxic appearing - trying to eat lunch  Heart: RRR Lungs: mostly clear  Abdomen: soft, non tender Extremities: mild edema     04/15/2019,11:53 AM  LOS: 5 days

## 2019-04-15 NOTE — Progress Notes (Signed)
Physical Therapy Treatment Patient Details Name: Erika Fry MRN: [id number] DOB: [birth year] Today's Date: 04/15/2019    History of Present Illness 73 yo female admitted with hydronephrosis s/p R nephrostomy tube/drain placement. Hx of neuropathy, DM, CKD, obesity, gout, OA, LE edema, osteopenia, venous insufficiency    PT Comments    Pt cooperative but requiring increased time for all tasks and limited 2* pain and dizziness with move to sitting 2* orthostatic BP.   Follow Up Recommendations  SNF     Equipment Recommendations  None recommended by PT    Recommendations for Other Services       Precautions / Restrictions Precautions Precautions: Fall Precaution Comments: R neph tube Restrictions Weight Bearing Restrictions: No    Mobility  Bed Mobility Overal bed mobility: Needs Assistance Bed Mobility: Supine to Sit;Sit to Supine   Sidelying to sit: Mod assist Supine to sit: Mod assist;+2 for physical assistance Sit to supine: Mod assist;Max assist;+2 for physical assistance   General bed mobility comments: Assist for legs and trunk  Transfers                 General transfer comment: NT 2* pt c/o increasing dizziness and noted fall in BP  Ambulation/Gait                 Stairs             Wheelchair Mobility    Modified Rankin (Stroke Patients Only)       Balance Overall balance assessment: Needs assistance Sitting-balance support: Bilateral upper extremity supported Sitting balance-Leahy Scale: Poor                                      Cognition Arousal/Alertness: Awake/alert Behavior During Therapy: WFL for tasks assessed/performed Overall Cognitive Status: Within Functional Limits for tasks assessed                                 General Comments: pt has not been very motivated to mobilize. She c/o pain more often than not      Exercises Other Exercises Other Exercises: encouraged bil  UE exercises, AROM (FF, ABD, and elbow flex) sitting EOB to try to increase BP    General Comments General comments (skin integrity, edema, etc.): BP 92/52 in sitting and decreased to 81/53 after several minutes in sitting performing UE therex      Pertinent Vitals/Pain Pain Assessment: Faces Faces Pain Scale: Hurts whole lot Pain Location: R SIDE Pain Descriptors / Indicators: Grimacing;Moaning;Discomfort Pain Intervention(s): Limited activity within patient's tolerance;Monitored during session;Repositioned    Home Living                      Prior Function            PT Goals (current goals can now be found in the care plan section) Acute Rehab PT Goals Patient Stated Goal: less pain PT Goal Formulation: With patient Time For Goal Achievement: 04/26/19 Potential to Achieve Goals: Good Progress towards PT goals: Progressing toward goals    Frequency    Min 3X/week      PT Plan Current plan remains appropriate    Co-evaluation PT/OT/SLP Co-Evaluation/Treatment: Yes Reason for Co-Treatment: For patient/therapist safety PT goals addressed during session: Mobility/safety with mobility OT goals addressed during session: Strengthening/ROM  AM-PAC PT "6 Clicks" Mobility   Outcome Measure  Help needed turning from your back to your side while in a flat bed without using bedrails?: A Lot Help needed moving from lying on your back to sitting on the side of a flat bed without using bedrails?: A Lot Help needed moving to and from a bed to a chair (including a wheelchair)?: A Lot Help needed standing up from a chair using your arms (e.g., wheelchair or bedside chair)?: A Lot Help needed to walk in hospital room?: Total Help needed climbing 3-5 steps with a railing? : Total 6 Click Score: 10    End of Session   Activity Tolerance: Patient limited by fatigue;Patient limited by pain Patient left: in bed;with call bell/phone within reach;with bed alarm  set Nurse Communication: Mobility status;Other (comment)(orthostatic BP) PT Visit Diagnosis: Muscle weakness (generalized) (M62.81);Difficulty in walking, not elsewhere classified (R26.2)     Time: 7505-1833 PT Time Calculation (min) (ACUTE ONLY): 23 min  Charges:  $Therapeutic Activity: 8-22 mins                     [place] Pager [phone] Office [phone]    Michaela Shankel 04/15/2019, 12:48 PM

## 2019-04-15 NOTE — Progress Notes (Signed)
Occupational Therapy Treatment Patient Details Name: Erika Fry MRN: [id number] DOB: 02-Jul-1946 Today's Date: 04/15/2019    History of present illness 73 yo female admitted with hydronephrosis s/p R nephrostomy tube/drain placement. Hx of neuropathy, DM, CKD, obesity, gout, OA, LE edema, osteopenia, venous insufficiency   OT comments  Pt willing to work with therapy, but limited by BP.  Sat EOB for several minutes, then returned to supine when BP continued to fall.  See general comments below  Follow Up Recommendations  SNF    Equipment Recommendations  3 in 1 bedside commode    Recommendations for Other Services      Precautions / Restrictions Precautions Precautions: Fall Precaution Comments: R neph tube Restrictions Weight Bearing Restrictions: No       Mobility Bed Mobility       Sidelying to sit: Mod assist Supine to sit: +2 for physical assistance Sit to supine: Mod assist;Max assist;+2 for physical assistance   General bed mobility comments: Assist for legs and trunk  Transfers                      Balance   Sitting-balance support: Bilateral upper extremity supported Sitting balance-Leahy Scale: Poor(could not tolerate sitting upright much; leaned R holding ra)                                     ADL either performed or assessed with clinical judgement   ADL                                               Vision       Perception     Praxis      Cognition Arousal/Alertness: Awake/alert Behavior During Therapy: WFL for tasks assessed/performed Overall Cognitive Status: Within Functional Limits for tasks assessed                                          Exercises Exercises: Other exercises Other Exercises Other Exercises: encouraged bil UE exercises, AROM (FF, ABD, and elbow flex) sitting EOB to try to increase BP   Shoulder Instructions       General Comments BP 92/52  sitting then 81/53 after several minutes and with UE exercise. Pt remained dizzy, so returned to supine    Pertinent Vitals/ Pain       Pain Assessment: Faces Faces Pain Scale: Hurts whole lot Pain Location: R SIDE Pain Descriptors / Indicators: Grimacing;Moaning;Discomfort Pain Intervention(s): Limited activity within patient's tolerance;Monitored during session;Repositioned  Home Living                                          Prior Functioning/Environment              Frequency  Min 2X/week        Progress Toward Goals  OT Goals(current goals can now be found in the care plan section)  Progress towards OT goals: Not progressing toward goals - comment(limited by BP/not feeling well)     Plan      Co-evaluation  PT/OT/SLP Co-Evaluation/Treatment: Yes Reason for Co-Treatment: For patient/therapist safety PT goals addressed during session: Mobility/safety with mobility;Balance OT goals addressed during session: Strengthening/ROM      AM-PAC OT "6 Clicks" Daily Activity     Outcome Measure   Help from another person eating meals?: A Little Help from another person taking care of personal grooming?: A Little Help from another person toileting, which includes using toliet, bedpan, or urinal?: Total Help from another person bathing (including washing, rinsing, drying)?: A Lot Help from another person to put on and taking off regular upper body clothing?: A Lot Help from another person to put on and taking off regular lower body clothing?: Total 6 Click Score: 12    End of Session    OT Visit Diagnosis: Muscle weakness (generalized) (M62.81)   Activity Tolerance Treatment limited secondary to medical complications (Comment)   Patient Left in bed;with call bell/phone within reach;with bed alarm set   Nurse Communication          Time: 1607-3710 OT Time Calculation (min): 23 min  Charges: OT General Charges $OT Visit: 1 Visit OT  Treatments $Therapeutic Activity: 8-22 mins  Lesle Chris, OTR/L Acute Rehabilitation Services [phone] [facility] pager [phone] office 04/15/2019   [place] 04/15/2019, 11:51 AM

## 2019-04-15 NOTE — Progress Notes (Signed)
PROGRESS NOTE    Erika Fry  PYK:[id number] DOB: [birth year] DOA: 04/09/2019 PCP: [name], MD    Brief Narrative:   73 year old female with history of chronic interstitial cystitis, chronic pelvic pain, chronic kidney disease due to hypertension stage IV with baseline creatinine about 3-3.6, type 2 diabetes on insulin, complete heart block status post dual-chamber pacemaker, diastolic heart failure hypothyroidism and anemia who was brought to interventional radiology for right nephrostomy tube placement and found to have acute kidney injury as well as severe persistent pain so she was admitted to the hospital.  She has been followed by urology.  Assessment & Plan:   Principal Problem:   Hydronephrosis Active Problems:   Mobitz type 2 second degree AV block   CKD (chronic kidney disease) stage 4, GFR 15-29 ml/min (HCC)   Diet-controlled diabetes mellitus (HCC)   Essential hypertension   Interstitial cystitis   AKI (acute kidney injury) ([place])  Acute kidney injury on stage IV chronic kidney disease complicated with hyponatremia, hyperkalemia and metabolic acidosis: Treated with bicarbonate infusion.  Some improvement of renal functions today. Her nephrostomy tube is draining. Appreciate nephrology input.  She will need very close monitoring.  Recheck levels in the morning.  Hypotension and fever at night: Patient had episodes of low blood pressure.  Temperature recorded 10 2 at night.  Will treat as sepsis originating from UTI.  History of enterococcus UTI. 2 L IV fluid bolus We will check lactic acid and pro calcitonin Blood cultures and urine cultures Meropenem for UTI. Patient has responded to IV fluids.  Will need close monitoring.  Intractable back pain, pelvic pain with interstitial cystitis: Treated with multiple agents including oxycodone, oxybutynin and darifenacin.  Currently on oral pain medications.  Continues to have pain issues. Urine culture were  negative. With new onset fever and hypotension, treating as UTI.  Recultures.  Right-sided hydronephrosis with UPJ obstruction: Obstruction due to?  Interstitial cystitis and scarring.  Nephrostomy tube draining well. Ambulate.  Foley catheter was placed suspecting of more obstruction.  Removed today.  Mobitz type II second-degree AV block status post pacemaker: Is stable.  Diet controlled type 2 diabetes: On sliding scale in the hospital.  Hypothyroidism: On Synthroid.  Euthyroid.   DVT prophylaxis: Heparin subcu Code Status: Full code Family Communication: Patient to communicate with family Disposition Plan: Inpatient   Consultants:   Urology, signed off for outpatient follow-up  Nephrology,   Procedures:   Right nephrostomy by IR  Antimicrobials: Meropenem, 04/15/2019   Subjective: Patient seen and examined.  Continues to complain of lower abdominal pain mostly in the suprapubic area and back pain.  Later in the afternoon, she had episode of hypotension.  Blood pressure manually recorded 65/40. Patient complain of dizziness on working with occupational therapist. Overnight temperature 102.  Objective: Vitals:   04/15/19 1102 04/15/19 1110 04/15/19 1210 04/15/19 1230  BP: (!) 74/42 (!) 65/40 (!) 89/46 (!) 89/55  Pulse: 86 77 75 72  Resp: 12  20 14   Temp: 98.6 F (37 C)  97.7 F (36.5 C) (!) 97.4 F (36.3 C)  TempSrc: Oral  Tympanic Tympanic  SpO2: 91%  91% 92%  Weight:      Height:        Intake/Output Summary (Last 24 hours) at 04/15/2019 1249 Last data filed at 04/15/2019 0600 Gross per 24 hour  Intake 1913.6 ml  Output 1350 ml  Net 563.6 ml   Filed Weights   04/09/19 1609  Weight: 67.3 kg  Examination:  General exam: Appears anxious and in moderate distress with pain.    Respiratory system: Clear to auscultation. Respiratory effort normal. Cardiovascular system: S1 & S2 heard, RRR. No JVD, murmurs, rubs, gallops or clicks. No pedal edema.   Pacemaker present.  Nontender. Gastrointestinal system: Abdomen is nondistended, soft . No organomegaly or masses felt. Normal bowel sounds heard.  Has some voluntary guarding and pain along the suprapubic area. Foley catheter with dark cloudy urine. Central nervous system: Alert and oriented. No focal neurological deficits. Extremities: Symmetric 5 x 5 power. Skin: No rashes, lesions or ulcers Psychiatry: Judgement and insight appear normal.  She is anxious.    Data Reviewed: I have personally reviewed following labs and imaging studies  CBC: Recent Labs  Lab 04/10/19 0515 04/11/19 0539 04/12/19 0440 04/14/19 0550 04/15/19 0534  WBC 20.4* 4.8 3.5* 6.1 6.8  NEUTROABS  --  4.5 2.8 4.7 4.8  HGB 9.3* 8.6* 8.5* 7.7* 8.2*  HCT 28.5* 26.6* 26.7* 23.9* 25.0*  MCV 89.1 90.5 90.2 88.2 89.0  PLT 209 142* 106* 73* 81*   Basic Metabolic Panel: Recent Labs  Lab 04/12/19 0440 04/13/19 0509 04/13/19 1531 04/14/19 0550 04/15/19 0534  NA 129* 128* 129* 129* 130*  K 4.5 4.3 3.8 3.5 3.3*  CL 102 99 96* 92* 89*  CO2 15* 16* 18* 25 30  GLUCOSE 99 109* 112* 151* 112*  BUN 112* 119* 108* 104* 91*  CREATININE 4.88* 5.03* 4.90* 4.66* 4.46*  CALCIUM 8.3* 8.4* 8.2* 8.0* 7.7*  MG  --   --   --   --  1.5*  PHOS  --   --   --   --  2.9   GFR: Estimated Creatinine Clearance: 10.4 mL/min (A) (by C-G formula based on SCr of 4.46 mg/dL (H)). Liver Function Tests: No results for input(s): AST, ALT, ALKPHOS, BILITOT, PROT, ALBUMIN in the last 168 hours. No results for input(s): LIPASE, AMYLASE in the last 168 hours. No results for input(s): AMMONIA in the last 168 hours. Coagulation Profile: Recent Labs  Lab 04/09/19 0751  INR 1.0   Cardiac Enzymes: No results for input(s): CKTOTAL, CKMB, CKMBINDEX, TROPONINI in the last 168 hours. BNP (last 3 results) No results for input(s): PROBNP in the last 8760 hours. HbA1C: No results for input(s): HGBA1C in the last 72 hours. CBG: Recent Labs   Lab 04/14/19 1158 04/14/19 1647 04/14/19 2248 04/15/19 0718 04/15/19 1139  GLUCAP 118* 151* 136* 126* 110*   Lipid Profile: No results for input(s): CHOL, HDL, LDLCALC, TRIG, CHOLHDL, LDLDIRECT in the last 72 hours. Thyroid Function Tests: No results for input(s): TSH, T4TOTAL, FREET4, T3FREE, THYROIDAB in the last 72 hours. Anemia Panel: No results for input(s): VITAMINB12, FOLATE, FERRITIN, TIBC, IRON, RETICCTPCT in the last 72 hours. Sepsis Labs: Recent Labs  Lab 04/15/19 1212  LATICACIDVEN 0.9    Recent Results (from the past 240 hour(s))  Culture, Urine     Status: None   Collection Time: 04/10/19  1:22 PM  Result Value Ref Range Status   Specimen Description   Final    URINE, CLEAN CATCH Performed at [facility], [place] [address] [place], [place] [address]    Special Requests   Final    NONE Performed at [facility], [place] [address] [place], [place] [address]    Culture   Final    NO GROWTH Performed at [facility], [place] [address], [place], [place] [address]  Report Status 04/11/2019 FINAL  Final  Culture, Urine     Status: None   Collection Time: 04/13/19  1:46 PM  Result Value Ref Range Status   Specimen Description   Final    URINE, CATHETERIZED Performed at [place] [address] [place], [place] [address]    Special Requests   Final    Normal Performed at [facility], [place] [address] [place], [place] [address]    Culture   Final    Multiple bacterial morphotypes present, none predominant. Suggest appropriate recollection if clinically indicated.   Report Status 04/15/2019 FINAL  Final         Radiology Studies: No results found.      Scheduled Meds: . darbepoetin (ARANESP) injection - NON-DIALYSIS  150 mcg Subcutaneous Once  . darifenacin  7.5 mg Oral Daily  . ferrous sulfate  325 mg Oral BID WC  . folic acid  1 mg Oral Daily  . gabapentin   300 mg Oral Daily  . heparin  5,000 Units Subcutaneous Q8H  . insulin aspart  0-9 Units Subcutaneous TID WC  . levothyroxine  75 mcg Oral QAC breakfast  . pantoprazole  40 mg Oral BID  . simvastatin  40 mg Oral QHS  . vitamin B-12  1,000 mcg Oral Daily   Continuous Infusions: . meropenem (MERREM) IV     Followed by  . meropenem (MERREM) IV       LOS: 5 days    Time spent: 25 minutes    Barb Merino, MD [facility] Pager [phone]  If 7PM-7AM, please contact night-coverage [email or web address] Password [facility] 04/15/2019, 12:49 PM

## 2019-04-15 NOTE — Progress Notes (Signed)
Pharmacy Antibiotic Note  Erika Fry is a 73 y.o. female admitted on 04/09/2019 with pain from PCN tube. Febrile and hypotensive today; Pharmacy has been consulted for Zosyn dosing for sepsis covering likely urinary pathogens, including recurrent enterococcus and enterobacter in urine (see 02/11/19 for susceptibilities).  Plan:  Meropenem 1g IV x 1, then 500 mg IV q12 hr  SCr q48  Height: 5\' 4"  (162.6 cm) Weight: 148 lb 5.9 oz (67.3 kg) IBW/kg (Calculated) : 54.7  Temp (24hrs), Avg:99.2 F (37.3 C), Min:97.7 F (36.5 C), Max:102.9 F (39.4 C)  Recent Labs  Lab 04/10/19 0515 04/11/19 0539 04/12/19 0440 04/13/19 0509 04/13/19 1531 04/14/19 0550 04/15/19 0534  WBC 20.4* 4.8 3.5*  --   --  6.1 6.8  CREATININE 4.78* 4.70* 4.88* 5.03* 4.90* 4.66* 4.46*    Estimated Creatinine Clearance: 10.4 mL/min (A) (by C-G formula based on SCr of 4.46 mg/dL (H)).    Allergies  Allergen Reactions  . Baclofen Other (See Comments)    somnolence  . Codeine Other (See Comments)    Increases Pain and couldn't sleep      Thank you for allowing pharmacy to be a part of this patient's care.  Reuel Boom, PharmD, BCPS [phone] 04/15/2019, 12:21 PM

## 2019-04-16 LAB — RENAL FUNCTION PANEL
Albumin: 1.9 g/dL — ABNORMAL LOW (ref 3.5–5.0)
Anion gap: 10 (ref 5–15)
BUN: 83 mg/dL — ABNORMAL HIGH (ref 8–23)
CO2: 28 mmol/L (ref 22–32)
Calcium: 7.7 mg/dL — ABNORMAL LOW (ref 8.9–10.3)
Chloride: 93 mmol/L — ABNORMAL LOW (ref 98–111)
Creatinine, Ser: 4.25 mg/dL — ABNORMAL HIGH (ref 0.44–1.00)
GFR calc Af Amer: 11 mL/min — ABNORMAL LOW (ref >60)
GFR calc non Af Amer: 10 mL/min — ABNORMAL LOW (ref >60)
Glucose, Bld: 98 mg/dL (ref 70–99)
Phosphorus: 3.1 mg/dL (ref 2.5–4.6)
Potassium: 3.1 mmol/L — ABNORMAL LOW (ref 3.5–5.1)
Sodium: 131 mmol/L — ABNORMAL LOW (ref 135–145)

## 2019-04-16 LAB — FERRITIN: Ferritin: 832 ng/mL — ABNORMAL HIGH (ref 11–307)

## 2019-04-16 LAB — GLUCOSE, CAPILLARY
Glucose-Capillary: 111 mg/dL — ABNORMAL HIGH (ref 70–99)
Glucose-Capillary: 93 mg/dL (ref 70–99)
Glucose-Capillary: 119 mg/dL — ABNORMAL HIGH (ref 70–99)
Glucose-Capillary: 114 mg/dL — ABNORMAL HIGH (ref 70–99)

## 2019-04-16 LAB — IRON AND TIBC
Iron: 50 ug/dL (ref 28–170)
Saturation Ratios: 43 % — ABNORMAL HIGH (ref 10.4–31.8)
TIBC: 115 ug/dL — ABNORMAL LOW (ref 250–450)
UIBC: 65 ug/dL

## 2019-04-16 MED ORDER — POTASSIUM CHLORIDE CRYS ER 20 MEQ PO TBCR
40.0000 meq | EXTENDED_RELEASE_TABLET | Freq: Two times a day (BID) | ORAL | Status: AC
  Administered 2019-04-16 (×2): 40 meq via ORAL
  Filled 2019-04-16 (×2): qty 2

## 2019-04-16 NOTE — Progress Notes (Signed)
Subjective:  Temp curve better- BP better as well 1700 of UOP recorded again , crt down slightly again.    Objective Vital signs in last 24 hours: Vitals:   04/15/19 1827 04/15/19 2146 04/16/19 0144 04/16/19 0531  BP: (!) 98/57 (!) 99/55 (!) 110/56 (!) 105/57  Pulse: 73 74 78 77  Resp: 16 16 16 20   Temp: 98.5 F (36.9 C) 98.3 F (36.8 C) 98.3 F (36.8 C) 98.8 F (37.1 C)  TempSrc: Oral Oral Oral Oral  SpO2: 100% 92% 93% 95%  Weight:      Height:       Weight change:   Intake/Output Summary (Last 24 hours) at 04/16/2019 1153 Last data filed at 04/16/2019 1147 Gross per 24 hour  Intake 560 ml  Output 2525 ml  Net -1965 ml    Assessment/ Plan: Pt is a 73 y.o. yo female with stage 4 CKD as OP - recent nephrostomy tube who was admitted on 04/09/2019 with pain from nephrostomy tube   Assessment/Plan: 1. A on CRF- crt lower than it has been in some time, BUN well under 100.  Good UOP- no absolute indications for dialysis- difficult to gauge uremic sxms as I suspect never says feels good   2. Anemia-  iron stores OK  - started ESA  3. Secondary hyperparathyroidism- phos is low/good, calc lowish as well- no treatment 4. HTN/volume- BP lowish- no BP meds- suspect pain meds ?  are keeping it low.  Sodium is improving daily with her autodiuresis  5. Fever- urine culture mult species- blood cultures pending - meropenam ordered - no fever last 24 hours  6. Hypokalemia - repletion ordered  7. Dispo - possible discharge soon - I have no issue with that as kidney function appears to be trending better - will make sure is set up to see Johnney Ou as an OP   [place]: Basic Metabolic Panel: Recent Labs  Lab 04/14/19 0550 04/15/19 0534 04/16/19 0532  NA 129* 130* 131*  K 3.5 3.3* 3.1*  CL 92* 89* 93*  CO2 25 30 28   GLUCOSE 151* 112* 98  BUN 104* 91* 83*  CREATININE 4.66* 4.46* 4.25*  CALCIUM 8.0* 7.7* 7.7*  PHOS  --  2.9 3.1   Liver Function Tests: Recent Labs   Lab 04/16/19 0532  ALBUMIN 1.9*   No results for input(s): LIPASE, AMYLASE in the last 168 hours. No results for input(s): AMMONIA in the last 168 hours. CBC: Recent Labs  Lab 04/10/19 0515  04/11/19 0539 04/12/19 0440 04/14/19 0550 04/15/19 0534  WBC 20.4*  --  4.8 3.5* 6.1 6.8  NEUTROABS  --    < > 4.5 2.8 4.7 4.8  HGB 9.3*  --  8.6* 8.5* 7.7* 8.2*  HCT 28.5*  --  26.6* 26.7* 23.9* 25.0*  MCV 89.1  --  90.5 90.2 88.2 89.0  PLT 209  --  142* 106* 73* 81*   < > = values in this interval not displayed.   Cardiac Enzymes: No results for input(s): CKTOTAL, CKMB, CKMBINDEX, TROPONINI in the last 168 hours. CBG: Recent Labs  Lab 04/15/19 0718 04/15/19 1139 04/15/19 1623 04/15/19 2142 04/16/19 0742  GLUCAP 126* 110* 146* 125* 111*    Iron Studies:  Recent Labs    04/16/19 0532  IRON 50  TIBC 115*  FERRITIN 832*   Studies/Results: No results found. Medications: Infusions: . meropenem (MERREM) IV 500 mg (04/16/19 1137)    Scheduled Medications: .  darifenacin  7.5 mg Oral Daily  . ferrous sulfate  325 mg Oral BID WC  . folic acid  1 mg Oral Daily  . gabapentin  300 mg Oral Daily  . heparin  5,000 Units Subcutaneous Q8H  . insulin aspart  0-9 Units Subcutaneous TID WC  . levothyroxine  75 mcg Oral QAC breakfast  . pantoprazole  40 mg Oral BID  . potassium chloride  40 mEq Oral BID  . simvastatin  40 mg Oral QHS  . vitamin B-12  1,000 mcg Oral Daily    have reviewed scheduled and prn medications.  Physical Exam: General: non toxic appearing - happy that things look better  Heart: RRR Lungs: mostly clear  Abdomen: soft, non tender Extremities: mild edema     04/16/2019,11:53 AM  LOS: 6 days

## 2019-04-16 NOTE — Progress Notes (Signed)
Referring Physician(s): Herrick,Benjamin W  Supervising Physician: Markus Daft  Patient Status:  [facility] - In-pt  Chief Complaint: Hydronephrosis  Subjective: Spiked a fever overnight 5/14, now on Meropenem.  No fevers since.  "I'm willing to work with PT if they'll see me." Knows she needs to increase activity. Complains of pelvic/bladder discomfort.  No issues with PCN.  Allergies: Baclofen and Codeine  Medications: Prior to Admission medications   Medication Sig Start Date End Date Taking? Authorizing Provider  acetaminophen (TYLENOL) 325 MG tablet Take 650 mg by mouth as needed.   Yes [provider]  ALOE VERA PO Take 3 capsules by mouth 2 (two) times a day.   Yes [provider]  amitriptyline (ELAVIL) 25 MG tablet Take 25 mg by mouth at bedtime. 03/29/19  Yes [provider]  Ascorbic Acid (VITAMIN C PO) Take 1 tablet by mouth 2 (two) times daily.   Yes [provider]  Biotin 5 MG CAPS Take 1 capsule (5 mg total) by mouth daily. 01/14/19  Yes Ardis Hughs, MD  cetirizine (ZYRTEC) 10 MG tablet Take 10 mg by mouth at bedtime.    Yes [provider]  ferrous sulfate 325 (65 FE) MG tablet Take 1 tablet (325 mg total) by mouth 2 (two) times daily with a meal. 06/22/18  Yes Love, Ivan Anchors, PA-C  folic acid (FOLVITE) 1 MG tablet Take 1 tablet (1 mg total) by mouth daily. 06/22/18  Yes Love, Ivan Anchors, PA-C  furosemide (LASIX) 80 MG tablet Take 80 mg by mouth 2 (two) times daily. 03/05/19  Yes [provider]  gabapentin (NEURONTIN) 300 MG capsule Take 300 mg by mouth daily.  02/09/19  Yes [provider]  levothyroxine (SYNTHROID, LEVOTHROID) 75 MCG tablet Take 75 mcg by mouth daily before breakfast.   Yes [provider]  mirabegron ER (MYRBETRIQ) 50 MG TB24 tablet Take 50 mg by mouth every morning.    Yes [provider]  Multiple Vitamin (MULTI-VITAMIN PO) Take 1 tablet by mouth daily.   Yes  [provider]  pantoprazole (PROTONIX) 40 MG tablet Take 1 tablet (40 mg total) by mouth 2 (two) times daily for 30 days. (take once daily after 1 month) 01/29/19 04/09/19 Yes Elodia Florence., MD  potassium chloride (K-DUR) 10 MEQ tablet Take 1 tablet (10 mEq total) by mouth 2 (two) times daily. 02/19/19  Yes Georgette Shell, MD  simvastatin (ZOCOR) 40 MG tablet Take 0.5 tablets (20 mg total) by mouth at bedtime. Patient taking differently: Take 40 mg by mouth at bedtime.  06/22/18  Yes Love, Ivan Anchors, PA-C  sodium bicarbonate 650 MG tablet Take 2 tablets (1,300 mg total) by mouth 2 (two) times daily. 02/19/19  Yes Georgette Shell, MD  solifenacin (VESICARE) 5 MG tablet Take 5 mg by mouth every evening.  02/04/19  Yes [provider]  vitamin B-12 (CYANOCOBALAMIN) 1000 MCG tablet Take 1 tablet (1,000 mcg total) by mouth daily. 06/22/18  Yes Love, Ivan Anchors, PA-C  colchicine 0.6 MG tablet Take 1 tablet (0.6 mg total) by mouth daily. Patient not taking: Reported on 04/09/2019 02/19/19   Georgette Shell, MD  fluticasone [facility]) 50 MCG/ACT nasal spray Place 2 sprays into both nostrils daily as needed for allergies or rhinitis.    [provider]  Hydrocortisone (GERHARDT'S BUTT CREAM) CREA Apply 1 application topically 4 (four) times daily. Patient not taking: Reported on 04/09/2019 02/19/19   Georgette Shell,  MD  predniSONE (DELTASONE) 10 MG tablet Take 3 tabs daily for 3 days then 2 tabs daily for 3 days then 1 tab daily till done Patient not taking: Reported on 04/09/2019 02/19/19   Georgette Shell, MD     Vital Signs: BP (!) 105/57 (BP Location: Left Arm)   Pulse 77   Temp 98.8 F (37.1 C) (Oral)   Resp 20   Ht 5\' 4"  (1.626 m)   Wt 148 lb 5.9 oz (67.3 kg)   SpO2 95%   BMI 25.47 kg/m   Physical Exam  NAD, alert Abdomen/flank:  R PCN in place.  Insertion site intact, with small amount of dried blood.  Non-tender. Clear, yellow urine in  collection bag.   Imaging: US Renal  Result Date: 04/12/2019 CLINICAL DATA:  Inpatient. Acute kidney injury. Status post percutaneous nephrostomy on the right on 04/09/2019 for symptomatic hydronephrosis after lithotripsy and ureteral stent removal. EXAM: RENAL / URINARY TRACT ULTRASOUND COMPLETE COMPARISON:  04/09/2019 percutaneous nephrostomy ultrasound and fluoroscopic images. 02/10/2019 CT abdomen/pelvis. FINDINGS: Right Kidney: Renal measurements: 12.7 x 4.9 x 5.2 cm = volume: 167 mL. Mildly echogenic right renal parenchyma, which is normal in thickness. Mild right hydronephrosis, significantly improved since 04/09/2019 percutaneous nephrostomy ultrasound image. Multiple nonobstructing lower right renal stones, largest 8 mm. No right renal mass. Tip of right percutaneous nephrostomy tube not well demonstrated on this scan. Left Kidney: Renal measurements: 11.0 x 4.8 x 4.9 cm = volume: 140 mL. Mildly echogenic left renal parenchyma, which is normal in thickness. No left hydronephrosis. Simple 1.1 x 1.1 x 1.1 cm lower left renal cyst. Nonobstructing 6 mm upper and 5 mm interpolar left renal stones. Bladder: Nondistended bladder with suggestion of diffuse bladder wall thickening. IMPRESSION: 1. Mild right hydronephrosis, significantly improved compared to the 04/09/2019 percutaneous nephrostomy procedure images. 2. Nonobstructing bilateral nephrolithiasis. 3. Echogenic normal size kidneys, compatible with reported history of nonspecific acute renal parenchymal disease. 4. Small simple left renal cysts.  No suspicious renal masses. 5. Suggestion of diffuse bladder wall thickening, suggest correlation with urinalysis. No significant bladder distention. Electronically Signed   By: Ilona Sorrel M.D.   On: 04/12/2019 13:44    Labs:  CBC: Recent Labs    04/11/19 0539 04/12/19 0440 04/14/19 0550 04/15/19 0534  WBC 4.8 3.5* 6.1 6.8  HGB 8.6* 8.5* 7.7* 8.2*  HCT 26.6* 26.7* 23.9* 25.0*  PLT 142* 106*  73* 81*    COAGS: Recent Labs    05/13/18 0427 05/14/18 1151 04/09/19 0751  INR 1.01 1.01 1.0  APTT 25  --  30    BMP: Recent Labs    04/13/19 1531 04/14/19 0550 04/15/19 0534 04/16/19 0532  NA 129* 129* 130* 131*  K 3.8 3.5 3.3* 3.1*  CL 96* 92* 89* 93*  CO2 18* 25 30 28   GLUCOSE 112* 151* 112* 98  BUN 108* 104* 91* 83*  CALCIUM 8.2* 8.0* 7.7* 7.7*  CREATININE 4.90* 4.66* 4.46* 4.25*  GFRNONAA 8* 9* 9* 10*  GFRAA 9* 10* 11* 11*    LIVER FUNCTION TESTS: Recent Labs    02/15/19 0242 02/16/19 0814 02/17/19 0508 02/18/19 0452 04/16/19 0532  BILITOT 0.5 0.8 0.9 0.4  --   AST 12* 12* 11* 16  --   ALT 7 9 7 7   --   ALKPHOS 84 84 80 69  --   PROT 6.3* 6.0* 6.8 6.7  --   ALBUMIN 2.5*  2.5* 2.4*  2.4* 3.3*  3.3* 4.1  4.1 1.9*    Assessment and Plan: Hydronephrosis s/p R PCN placed 04/09/19 Patient without complaint related to drain.   Clear, yellow urine in collection bag.  Plans to work with PT today.  Continue current management.  IR available if needed.   Electronically Signed: Docia Barrier, PA 04/16/2019, 9:32 AM   I spent a total of 15 Minutes at the the patient's bedside AND on the patient's hospital floor or unit, greater than 50% of which was counseling/coordinating care for right hydronephrosis.

## 2019-04-16 NOTE — Progress Notes (Signed)
PROGRESS NOTE    Erika Fry  HAL:[id number] DOB: May 16, 1946 DOA: 04/09/2019 PCP: [name], MD    Brief Narrative:   73 year old female with history of chronic interstitial cystitis, chronic pelvic pain, chronic kidney disease due to hypertension stage IV with baseline creatinine about 3-3.6, type 2 diabetes on insulin, complete heart block status post dual-chamber pacemaker, diastolic heart failure hypothyroidism and anemia who was brought to interventional radiology for right nephrostomy tube placement and found to have acute kidney injury as well as severe persistent pain so she was admitted to the hospital.   Assessment & Plan:   Principal Problem:   Hydronephrosis Active Problems:   Mobitz type 2 second degree AV block   CKD (chronic kidney disease) stage 4, GFR 15-29 ml/min (HCC)   Diet-controlled diabetes mellitus ([place])   Essential hypertension   Interstitial cystitis   AKI (acute kidney injury) ([place])  Acute kidney injury on stage IV chronic kidney disease complicated with hyponatremia, hyperkalemia and metabolic acidosis:  Treated with bicarbonate infusion. Renal functions appropriately improving. Her nephrostomy tube is draining. Followed by nephrology. She will need very close monitoring.  May need very close outpatient follow-up. Potassium is 3.1, will replace today.  Recheck levels in the morning.  Febrile episode:  Patient had episodes of low blood pressure.  Temperature recorded 10 2 at night.  Was treated as sepsis.  History of enterococcus and enterobacteria in urine.  Remains on meropenem.  Blood culture urine culture negative so far.  Lactic acid normal.  Procalcitonin normal.  We will continue meropenem until final urine cultures.  She may be colonized.  Intractable back pain, pelvic pain with interstitial cystitis: Treated with multiple agents including oxycodone, oxybutynin and darifenacin.  Currently on oral pain medications.  Continues to have  pain issues.  Right-sided hydronephrosis with UPJ obstruction: Obstruction due to?  Interstitial cystitis and scarring.  Nephrostomy tube draining well. Ambulate.  Foley catheter was placed suspecting of more obstruction.  Removed today.  Mobitz type II second-degree AV block status post pacemaker: Is stable.  Diet controlled type 2 diabetes: On sliding scale in the hospital.  Hypothyroidism: On Synthroid.  Euthyroid.  Advance activities.  Work with PT OT. Anticipate discharge home today or tomorrow after ambulation with PT OT. Patient is not interested to go for inpatient rehab.  DVT prophylaxis: Heparin subcu Code Status: Full code Family Communication: Patient to communicate with family Disposition Plan: Inpatient   Consultants:   Urology, signed off for outpatient follow-up  Nephrology,   Procedures:   Right nephrostomy by IR  Antimicrobials: Meropenem, 04/15/2019---    Subjective: Patient seen and examined.  Continues to have leg pains.  Continues to have suprapubic pain.  No fever overnight.  Denies any nausea vomiting.  She is yet to walk with therapist today. Objective: Vitals:   04/15/19 1827 04/15/19 2146 04/16/19 0144 04/16/19 0531  BP: (!) 98/57 (!) 99/55 (!) 110/56 (!) 105/57  Pulse: 73 74 78 77  Resp: 16 16 16 20   Temp: 98.5 F (36.9 C) 98.3 F (36.8 C) 98.3 F (36.8 C) 98.8 F (37.1 C)  TempSrc: Oral Oral Oral Oral  SpO2: 100% 92% 93% 95%  Weight:      Height:        Intake/Output Summary (Last 24 hours) at 04/16/2019 1142 Last data filed at 04/16/2019 0815 Gross per 24 hour  Intake 560 ml  Output 2075 ml  Net -1515 ml   Filed Weights   04/09/19 1609  Weight:  67.3 kg    Examination:  General exam: Appears anxious.  She does not appear to be in severe pain.    Respiratory system: Clear to auscultation. Respiratory effort normal. Cardiovascular system: S1 & S2 heard, RRR. No JVD, murmurs, rubs, gallops or clicks. No pedal edema.   Pacemaker present.  Nontender. Gastrointestinal system: Abdomen is nondistended, soft . No organomegaly or masses felt. Normal bowel sounds heard.  Mild generalized tenderness.  Nephrostomy with clear urine. Central nervous system: Alert and oriented. No focal neurological deficits. Extremities: Symmetric 5 x 5 power. Skin: No rashes, lesions or ulcers Psychiatry: Judgement and insight appear normal.  She is anxious.    Data Reviewed: I have personally reviewed following labs and imaging studies  CBC: Recent Labs  Lab 04/10/19 0515 04/11/19 0539 04/12/19 0440 04/14/19 0550 04/15/19 0534  WBC 20.4* 4.8 3.5* 6.1 6.8  NEUTROABS  --  4.5 2.8 4.7 4.8  HGB 9.3* 8.6* 8.5* 7.7* 8.2*  HCT 28.5* 26.6* 26.7* 23.9* 25.0*  MCV 89.1 90.5 90.2 88.2 89.0  PLT 209 142* 106* 73* 81*   Basic Metabolic Panel: Recent Labs  Lab 04/13/19 0509 04/13/19 1531 04/14/19 0550 04/15/19 0534 04/16/19 0532  NA 128* 129* 129* 130* 131*  K 4.3 3.8 3.5 3.3* 3.1*  CL 99 96* 92* 89* 93*  CO2 16* 18* 25 30 28   GLUCOSE 109* 112* 151* 112* 98  BUN 119* 108* 104* 91* 83*  CREATININE 5.03* 4.90* 4.66* 4.46* 4.25*  CALCIUM 8.4* 8.2* 8.0* 7.7* 7.7*  MG  --   --   --  1.5*  --   PHOS  --   --   --  2.9 3.1   GFR: Estimated Creatinine Clearance: 10.9 mL/min (A) (by C-G formula based on SCr of 4.25 mg/dL (H)). Liver Function Tests: Recent Labs  Lab 04/16/19 0532  ALBUMIN 1.9*   No results for input(s): LIPASE, AMYLASE in the last 168 hours. No results for input(s): AMMONIA in the last 168 hours. Coagulation Profile: No results for input(s): INR, PROTIME in the last 168 hours. Cardiac Enzymes: No results for input(s): CKTOTAL, CKMB, CKMBINDEX, TROPONINI in the last 168 hours. BNP (last 3 results) No results for input(s): PROBNP in the last 8760 hours. HbA1C: No results for input(s): HGBA1C in the last 72 hours. CBG: Recent Labs  Lab 04/15/19 0718 04/15/19 1139 04/15/19 1623 04/15/19 2142  04/16/19 0742  GLUCAP 126* 110* 146* 125* 111*   Lipid Profile: No results for input(s): CHOL, HDL, LDLCALC, TRIG, CHOLHDL, LDLDIRECT in the last 72 hours. Thyroid Function Tests: No results for input(s): TSH, T4TOTAL, FREET4, T3FREE, THYROIDAB in the last 72 hours. Anemia Panel: Recent Labs    04/16/19 0532  FERRITIN 832*  TIBC 115*  IRON 50   Sepsis Labs: Recent Labs  Lab 04/15/19 1201 04/15/19 1212  PROCALCITON 5.46  --   LATICACIDVEN  --  0.9    Recent Results (from the past 240 hour(s))  Culture, Urine     Status: None   Collection Time: 04/10/19  1:22 PM  Result Value Ref Range Status   Specimen Description   Final    URINE, CLEAN CATCH Performed at [facility], [place] [address] [place], [place] [address]    Special Requests   Final    NONE Performed at [facility], [place] [address], [place], [place] [address]    Culture   Final    NO GROWTH Performed at [facility], [address] [place], [place] [address]    Report Status 04/11/2019 FINAL  Final  Culture, Urine     Status: None   Collection Time: 04/13/19  1:46 PM  Result Value Ref Range Status   Specimen Description   Final    URINE, CATHETERIZED Performed at [facility] [address], [place], [place] [address]    Special Requests   Final    Normal Performed at [facility], [place] [address], [place], [place] [address]    Culture   Final    Multiple bacterial morphotypes present, none predominant. Suggest appropriate recollection if clinically indicated.   Report Status 04/15/2019 FINAL  Final  Culture, blood (routine x 2)     Status: None (Preliminary result)   Collection Time: 04/15/19  9:55 AM  Result Value Ref Range Status   Specimen Description   Final    BLOOD RIGHT ARM Performed at [place] [address], [place], [place] [address]    Special Requests   Final    BOTTLES DRAWN  AEROBIC AND ANAEROBIC Blood Culture adequate volume Performed at [facility] [address] [place], [place] [address]    Culture   Final    NO GROWTH < 24 HOURS Performed at [place] [address] [place], [place] [address]    Report Status PENDING  Incomplete  Culture, blood (routine x 2)     Status: None (Preliminary result)   Collection Time: 04/15/19  9:56 AM  Result Value Ref Range Status   Specimen Description   Final    BLOOD LEFT HAND Performed at [facility] [address] [place], [place] [address]    Special Requests   Final    BOTTLES DRAWN AEROBIC AND ANAEROBIC Blood Culture adequate volume Performed at [facility] [address] [place], [place] [address]    Culture   Final    NO GROWTH < 24 HOURS Performed at [facility] [address], [place],  [address]    Report Status PENDING  Incomplete         Radiology Studies: No results found.      Scheduled Meds: . darifenacin  7.5 mg Oral Daily  . ferrous sulfate  325 mg Oral BID WC  . folic acid  1 mg Oral Daily  . gabapentin  300 mg Oral Daily  . heparin  5,000 Units Subcutaneous Q8H  . insulin aspart  0-9 Units Subcutaneous TID WC  . levothyroxine  75 mcg Oral QAC breakfast  . pantoprazole  40 mg Oral BID  . potassium chloride  40 mEq Oral BID  . simvastatin  40 mg Oral QHS  . vitamin B-12  1,000 mcg Oral Daily   Continuous Infusions: . meropenem (MERREM) IV 500 mg (04/15/19 2215)     LOS: 6 days    Time spent: 25 minutes    Barb Merino, MD [facility] Pager [phone]  If 7PM-7AM, please contact night-coverage [email or web address] Password [facility] 04/16/2019, 11:42 AM

## 2019-04-16 NOTE — Progress Notes (Signed)
Patient refusing ambulation, mobility, and repositioning due to pain level.  Pain treated with PRN medications as ordered.

## 2019-04-16 NOTE — Progress Notes (Signed)
Attempted to get patient OOB to ambulate again - patient unable to stand due to pain.  Required max assist to get to edge of bed.  MD made aware

## 2019-04-17 ENCOUNTER — Inpatient Hospital Stay (HOSPITAL_COMMUNITY): Payer: Medicare Other

## 2019-04-17 DIAGNOSIS — Z515 Encounter for palliative care: Secondary | ICD-10-CM

## 2019-04-17 DIAGNOSIS — N130 Hydronephrosis with ureteropelvic junction obstruction: Secondary | ICD-10-CM

## 2019-04-17 DIAGNOSIS — R300 Dysuria: Secondary | ICD-10-CM

## 2019-04-17 DIAGNOSIS — M25559 Pain in unspecified hip: Secondary | ICD-10-CM

## 2019-04-17 LAB — GLUCOSE, CAPILLARY
Glucose-Capillary: 109 mg/dL — ABNORMAL HIGH (ref 70–99)
Glucose-Capillary: 139 mg/dL — ABNORMAL HIGH (ref 70–99)
Glucose-Capillary: 172 mg/dL — ABNORMAL HIGH (ref 70–99)
Glucose-Capillary: 115 mg/dL — ABNORMAL HIGH (ref 70–99)

## 2019-04-17 LAB — RENAL FUNCTION PANEL
Albumin: 2 g/dL — ABNORMAL LOW (ref 3.5–5.0)
Anion gap: 12 (ref 5–15)
BUN: 72 mg/dL — ABNORMAL HIGH (ref 8–23)
CO2: 25 mmol/L (ref 22–32)
Calcium: 8.4 mg/dL — ABNORMAL LOW (ref 8.9–10.3)
Chloride: 96 mmol/L — ABNORMAL LOW (ref 98–111)
Creatinine, Ser: 4.1 mg/dL — ABNORMAL HIGH (ref 0.44–1.00)
GFR calc Af Amer: 12 mL/min — ABNORMAL LOW (ref >60)
GFR calc non Af Amer: 10 mL/min — ABNORMAL LOW (ref >60)
Glucose, Bld: 115 mg/dL — ABNORMAL HIGH (ref 70–99)
Phosphorus: 2.5 mg/dL (ref 2.5–4.6)
Potassium: 4.4 mmol/L (ref 3.5–5.1)
Sodium: 133 mmol/L — ABNORMAL LOW (ref 135–145)

## 2019-04-17 MED ORDER — OXYCODONE HCL 5 MG PO TABS
5.0000 mg | ORAL_TABLET | ORAL | Status: DC | PRN
  Administered 2019-04-17 – 2019-04-21 (×9): 5 mg via ORAL
  Filled 2019-04-17 (×10): qty 1

## 2019-04-17 MED ORDER — MUSCLE RUB 10-15 % EX CREA
TOPICAL_CREAM | CUTANEOUS | Status: DC | PRN
  Administered 2019-04-17: 19:00:00 via TOPICAL
  Filled 2019-04-17 (×2): qty 85

## 2019-04-17 MED ORDER — FENTANYL CITRATE (PF) 100 MCG/2ML IJ SOLN
12.5000 ug | INTRAMUSCULAR | Status: DC | PRN
  Administered 2019-04-19 – 2019-04-20 (×2): 12.5 ug via INTRAVENOUS
  Filled 2019-04-17: qty 2

## 2019-04-17 NOTE — Consult Note (Addendum)
Consultation Note Date: 04/17/2019   Patient Name: Erika Fry  DOB: 1946-06-11  MRN: [id number]  Age / Sex: 73 y.o., female  PCP: [name], MD Referring Physician: Barb Merino, MD  Reason for Consultation: Establishing goals of care, Non pain symptom management and Pain control  HPI/Patient Profile: 73 y.o. female   admitted on 04/09/2019    Clinical Assessment and Goals of Care: 73 year old lady with a history of chronic interstitial cystitis, chronic pelvic pain.  Patient has chronic kidney disease due to hypertension stage IV with baseline creatinine around 3-3 0.6.  She also has diabetes, complete heart block status post pacemaker placement, diastolic heart failure, hypothyroidism and anemia.  Patient has been admitted to the hospital through interventional radiology for right nephrostomy tube placement, found to have acute kidney injury as well as severe persistent pain and hence was admitted to hospital medicine service.  Patient is being followed by radiology as well as nephrology here.  She is on oral low-dose of opioids for pain.  She has required IV fluids and bicarbonate infusion for her acute kidney injury.  Patient is being given broad-spectrum antibiotic meropenem because of possible sepsis coming from urinary source with a history of enterococcus and antral bacteria in the urine in the past.  Of note, patient continues to have chronic back discomfort and pelvic pain for the past several months.  She has followed up with urology.  She has been on oxycodone oxybutynin.  Additionally, she has right-sided hydronephrosis with ureteropelvic junction obstruction and has a nephrostomy tube.  It has been noted in this hospitalization that the patient has marked functional decline.  She is attempting to participate with physical therapy as well as occupational therapy.  She has decreased  endurance and is requiring at least 2 person assist.  Palliative medicine has been consulted for pain management as well as supportive care, address appropriate disposition options.  Patient is awake alert sitting up in a chair.  It is actually her 73th birthday.  She states that she usually goes out to eat with her family on her birthday.  She does complain of some painful urination as well as bladder cramping.  She was given some Tylenol as well as p.o. oxycodone earlier.  She complains of pain in her knee.  I introduced myself and palliative care as follows: Palliative medicine is specialized medical care for people living with serious illness. It focuses on providing relief from the symptoms and stress of a serious illness. The goal is to improve quality of life for both the patient and the family.  Patient states that she lives by herself but her son and daughter-in-law live nearby.  She has had home-based physical therapy recently.  We discussed about appropriate pain management.  We discussed about various disposition options.  Goals wishes and values attempted to be outlined.  Offered active listening and supportive care. The patient states that she is afraid of contracting COVID-19 inside a SNF, she wants to be home.   Call placed and discussed  with daughter-in-law who is listed as first contact.  She states that the patient has started having bladder issues since November.  She has had gradual functional decline.  Family is most concerned because the patient lives by herself. Additionally, they state that the patient is usually a very bright, smart and articulate person, she has gradually had decline due to physical limitations.   Patient's family is requesting [facility].  The patient has been to [facility] inpatient rehab before and reportedly did well in that visit.  Family request for urology follow-up as well as lumbar MRI.  They state that the patient has chronic  kidney disease and had been on track for initiation of dialysis in the next coming months.  Discussed with daughter-in-law over the phone about appropriate pain and non-pain symptom management.  All of her questions addressed to the best of my ability.  Discussed with [facility] MD colleague.  NEXT OF KIN  3 sons and a daughter in law.   SUMMARY OF RECOMMENDATIONS   1. Augment opioids regimen and monitor. 2. Encourage PT OOB efforts 3. Family is asking about [facility] consult, the patient went to CIR after a previous hospitalization a few months ago and improved her functional status as per daughter in law.  4. Family asking for MRI lumbar spine, as per urology's recommendations.  Continue current pain and non pain symptom management options.  Thank you for the consult.    Code Status/Advance Care Planning:  Full code    Symptom Management:     As above  Palliative Prophylaxis:   Delirium Protocol  Additional Recommendations (Limitations, Scope, Preferences):  Full Scope Treatment  Psycho-social/Spiritual:   Desire for further Chaplaincy support:yes  Additional Recommendations: Caregiving  Support/Resources  Prognosis:   Unable to determine  Discharge Planning: To Be Determined      Primary Diagnoses: Present on Admission: . Hydronephrosis . Mobitz type 2 second degree AV block . Essential hypertension . AKI (acute kidney injury) ([place])   I have reviewed the medical record, interviewed the patient and family, and examined the patient. The following aspects are pertinent.  Past Medical History:  Diagnosis Date  . Arthritis    "knees, thumbs" (03/25/2018)  . CKD (chronic kidney disease), stage IV ([place])   . Diet-controlled diabetes mellitus ([place])   . GERD (gastroesophageal reflux disease)   . Gout    "on daily RX" (03/25/2018)  . Heart murmur   . High cholesterol   . Hypertension   . Hypothyroidism   . Iron deficiency anemia    "had to get an iron  infusion"  . Migraine    "used to have them growing up" (03/25/2018)  . Presence of permanent cardiac pacemaker 03/25/2018   Social History   Socioeconomic History  . Marital status: Widowed    Spouse name: Not on file  . Number of children: 3  . Years of education: Not on file  . Highest education level: Not on file  Occupational History  . Not on file  Social Needs  . Financial resource strain: Not on file  . Food insecurity:    Worry: Not on file    Inability: Not on file  . Transportation needs:    Medical: Not on file    Non-medical: Not on file  Tobacco Use  . Smoking status: Never Smoker  . Smokeless tobacco: Never Used  Substance and Sexual Activity  . Alcohol use: Not Currently  . Drug use: Never  .  Sexual activity: Not Currently  Lifestyle  . Physical activity:    Days per week: Not on file    Minutes per session: Not on file  . Stress: Not on file  Relationships  . Social connections:    Talks on phone: Not on file    Gets together: Not on file    Attends religious service: Not on file    Active member of club or organization: Not on file    Attends meetings of clubs or organizations: Not on file    Relationship status: Not on file  Other Topics Concern  . Not on file  Social History Narrative  . Not on file   Family History  Problem Relation Age of Onset  . Hypertension Mother   . Diabetes Mellitus II Father   . Heart disease Father   . Heart attack Father   . Gastric cancer Brother   . Diabetes Mellitus II Brother   . Stomach cancer Brother    Scheduled Meds: . darifenacin  7.5 mg Oral Daily  . ferrous sulfate  325 mg Oral BID WC  . folic acid  1 mg Oral Daily  . gabapentin  300 mg Oral Daily  . heparin  5,000 Units Subcutaneous Q8H  . insulin aspart  0-9 Units Subcutaneous TID WC  . levothyroxine  75 mcg Oral QAC breakfast  . pantoprazole  40 mg Oral BID  . simvastatin  40 mg Oral QHS  . vitamin B-12  1,000 mcg Oral Daily   Continuous  Infusions: PRN Meds:.acetaminophen **OR** acetaminophen, fentaNYL (SUBLIMAZE) injection, ondansetron **OR** ondansetron (ZOFRAN) IV, oxyCODONE, phenol Medications Prior to Admission:  Prior to Admission medications   Medication Sig Start Date End Date Taking? Authorizing Provider  acetaminophen (TYLENOL) 325 MG tablet Take 650 mg by mouth as needed.   Yes [provider]  ALOE VERA PO Take 3 capsules by mouth 2 (two) times a day.   Yes [provider]  amitriptyline (ELAVIL) 25 MG tablet Take 25 mg by mouth at bedtime. 03/29/19  Yes [provider]  Ascorbic Acid (VITAMIN C PO) Take 1 tablet by mouth 2 (two) times daily.   Yes [provider]  Biotin 5 MG CAPS Take 1 capsule (5 mg total) by mouth daily. 01/14/19  Yes Ardis Hughs, MD  cetirizine (ZYRTEC) 10 MG tablet Take 10 mg by mouth at bedtime.    Yes [provider]  ferrous sulfate 325 (65 FE) MG tablet Take 1 tablet (325 mg total) by mouth 2 (two) times daily with a meal. 06/22/18  Yes Love, Ivan Anchors, PA-C  folic acid (FOLVITE) 1 MG tablet Take 1 tablet (1 mg total) by mouth daily. 06/22/18  Yes Love, Ivan Anchors, PA-C  furosemide (LASIX) 80 MG tablet Take 80 mg by mouth 2 (two) times daily. 03/05/19  Yes [provider]  gabapentin (NEURONTIN) 300 MG capsule Take 300 mg by mouth daily.  02/09/19  Yes [provider]  levothyroxine (SYNTHROID, LEVOTHROID) 75 MCG tablet Take 75 mcg by mouth daily before breakfast.   Yes [provider]  mirabegron ER (MYRBETRIQ) 50 MG TB24 tablet Take 50 mg by mouth every morning.    Yes [provider]  Multiple Vitamin (MULTI-VITAMIN PO) Take 1 tablet by mouth daily.   Yes [provider]  pantoprazole (PROTONIX) 40 MG tablet Take 1 tablet (40 mg total) by mouth 2 (two) times daily for 30 days. (take once daily after 1 month) 01/29/19 04/09/19  Yes Elodia Florence., MD  potassium chloride (K-DUR) 10 MEQ tablet Take 1  tablet (10 mEq total) by mouth 2 (two) times daily. 02/19/19  Yes Georgette Shell, MD  simvastatin (ZOCOR) 40 MG tablet Take 0.5 tablets (20 mg total) by mouth at bedtime. Patient taking differently: Take 40 mg by mouth at bedtime.  06/22/18  Yes Love, Ivan Anchors, PA-C  sodium bicarbonate 650 MG tablet Take 2 tablets (1,300 mg total) by mouth 2 (two) times daily. 02/19/19  Yes Georgette Shell, MD  solifenacin (VESICARE) 5 MG tablet Take 5 mg by mouth every evening.  02/04/19  Yes [provider]  vitamin B-12 (CYANOCOBALAMIN) 1000 MCG tablet Take 1 tablet (1,000 mcg total) by mouth daily. 06/22/18  Yes Love, Ivan Anchors, PA-C  colchicine 0.6 MG tablet Take 1 tablet (0.6 mg total) by mouth daily. Patient not taking: Reported on 04/09/2019 02/19/19   Georgette Shell, MD  fluticasone [facility]) 50 MCG/ACT nasal spray Place 2 sprays into both nostrils daily as needed for allergies or rhinitis.    [provider]  Hydrocortisone (GERHARDT'S BUTT CREAM) CREA Apply 1 application topically 4 (four) times daily. Patient not taking: Reported on 04/09/2019 02/19/19   Georgette Shell, MD  predniSONE (DELTASONE) 10 MG tablet Take 3 tabs daily for 3 days then 2 tabs daily for 3 days then 1 tab daily till done Patient not taking: Reported on 04/09/2019 02/19/19   Georgette Shell, MD   Allergies  Allergen Reactions  . Baclofen Other (See Comments)    somnolence  . Codeine Other (See Comments)    Increases Pain and couldn't sleep   Review of Systems +bladder cramping +dysuria  Physical Exam Awake alert Sitting in chair Appears weak Has pacemaker Regular pattern of breathing Abdomen is not distended non tender No edema Non focal  Vital Signs: BP (!) 110/51 (BP Location: Right Arm)   Pulse 73   Temp 98.4 F (36.9 C) (Oral)   Resp 18   Ht 5\' 4"  (1.626 m)   Wt 67.3 kg   SpO2 96%   BMI 25.47 kg/m  Pain Scale: 0-10 POSS *See Group Information*: 1-Acceptable,Awake and  alert Pain Score: 10-Worst pain ever   SpO2: SpO2: 96 % O2 Device:SpO2: 96 % O2 Flow Rate: .O2 Flow Rate (L/min): 2 L/min  IO: Intake/output summary:   Intake/Output Summary (Last 24 hours) at 04/17/2019 1136 Last data filed at 04/17/2019 8144 Gross per 24 hour  Intake 940.06 ml  Output 2240 ml  Net -1299.94 ml    LBM: Last BM Date: 04/14/19 Baseline Weight: Weight: 67.3 kg Most recent weight: Weight: 67.3 kg     Palliative Assessment/Data:   Flowsheet Rows     Most Recent Value  Intake Tab  Referral Department  Hospitalist  Unit at Time of Referral  Med/Surg Unit  Palliative Care Primary Diagnosis  Nephrology  Palliative Care Type  New Palliative care  Reason for referral  Pain  Clinical Assessment  Palliative Performance Scale Score  40%  Pain Max last 24 hours  4  Pain Min Last 24 hours  3  Dyspnea Max Last 24 Hours  4  Dyspnea Min Last 24 hours  3  Nausea Max Last 24 Hours  4  Nausea Min Last 24 Hours  3  Psychosocial & Spiritual Assessment  Palliative Care Outcomes  Patient/Family meeting held?  Yes  Who was at the meeting?  discussed with daughter in law over the phone.  Time In:  1300 Time Out:  1410 Time Total:  70  Greater than 50%  of this time was spent counseling and coordinating care related to the above assessment and plan.  Signed by: Loistine Chance, MD  [phone] Please contact Palliative Medicine Team phone at [phone] for questions and concerns.  For individual provider: See Shea Evans

## 2019-04-17 NOTE — Progress Notes (Signed)
Stat MRI printed at 11:47am,  MRI Tech on call was not called about this patient. MRI Tech was called in for STAT ICU patient and noticed order for Erika Fry. Spoke with Pam RN, patient has Azure pacemaker and will have to be scanned at [facility] [place] [facility] [place] at [facility] to let her know RN will be calling.  BHJ 6:37pm

## 2019-04-17 NOTE — Progress Notes (Signed)
PROGRESS NOTE    Erika Fry  HBZ:[id number] DOB: Oct 21, 1946 DOA: 04/09/2019 PCP: [name], MD    Brief Narrative:   73 year old female with history of chronic interstitial cystitis, chronic pelvic pain, chronic kidney disease due to hypertension , stage IV with baseline creatinine about 3-3.6, type 2 diabetes on insulin, complete heart block status post dual-chamber pacemaker, diastolic heart failure , hypothyroidism and anemia who was brought to interventional radiology for right nephrostomy tube placement and found to have acute kidney injury as well as severe persistent pain so she was admitted to the hospital.   Patient remains in the hospital and continues to have pelvic pain. She is too debilitated to get out of bed, lives by herself and declines to go to inpatient therapies at [facility].  Assessment & Plan:   Principal Problem:   Hydronephrosis Active Problems:   Mobitz type 2 second degree AV block   CKD (chronic kidney disease) stage 4, GFR 15-29 ml/min (HCC)   Diet-controlled diabetes mellitus (HCC)   Essential hypertension   Interstitial cystitis   AKI (acute kidney injury) ([place])  Acute kidney injury on stage IV chronic kidney disease complicated with hyponatremia, hyperkalemia and metabolic acidosis:  Treated with bicarbonate infusion.Now off IV fluids. Renal functions appropriately improving. Her nephrostomy tube is draining. Followed by nephrology. She will need very close monitoring.  May need very close outpatient follow-up.  Febrile episode:  Patient had episodes of low blood pressure.  Temperature recorded 10 2 at night.  Was treated as sepsis.  History of enterococcus and enterobacteria in urine.  Treated with meropenem.   Blood cultures and urine cultures negative.  Lactic acid and procalcitonin normal.   Discontinue antibiotics.    Intractable back pain, pelvic pain with interstitial cystitis: Treated with multiple agents including oxycodone,  oxybutynin and darifenacin.  Currently on oral pain medications.  Continues to have pain issues. Continues to have significant pain.  May need long-term pain management. Discontinue IV narcotics in hope of discharge. We will consult palliative care to help with pain management.  Right-sided hydronephrosis with UPJ obstruction: Obstruction due to?  Interstitial cystitis and scarring.  Nephrostomy tube draining well. Ambulate.  Foley catheter was placed suspecting of more obstruction.  Removed and urinating well.  Mobitz type II second-degree AV block status post pacemaker: Is stable.  Diet controlled type 2 diabetes: On sliding scale in the hospital.  Hypothyroidism: On Synthroid.  Euthyroid.  Advance activities.  Work with PT OT. Difficult disposition.  Patient is severely deconditioned and unable to function independent and wants to go home. We will continue to assess her safe discharge options.  DVT prophylaxis: Heparin subcu Code Status: Full code Family Communication: Patient to communicate with family Disposition Plan: Inpatient   Consultants:   Urology, signed off for outpatient follow-up  Nephrology,   Procedures:   Right nephrostomy by IR  Antimicrobials: Meropenem, 04/15/2019--- 5/16/ 2020   Subjective: Patient seen and examined.  Continues to complain of pelvic pain and suprapubic pain.  Tearful.  Reluctant to ambulate and declines referral to SNF  Objective: Vitals:   04/16/19 0531 04/16/19 1341 04/16/19 2105 04/17/19 0521  BP: (!) 105/57 (!) 112/56 114/64 (!) 110/51  Pulse: 77 84 81 73  Resp: 20 20  18   Temp: 98.8 F (37.1 C) 98.8 F (37.1 C) 98.7 F (37.1 C) 98.4 F (36.9 C)  TempSrc: Oral Oral Oral Oral  SpO2: 95% 95% 96% 96%  Weight:      Height:  Intake/Output Summary (Last 24 hours) at 04/17/2019 1100 Last data filed at 04/17/2019 6967 Gross per 24 hour  Intake 940.06 ml  Output 2240 ml  Net -1299.94 ml   Filed Weights   04/09/19  1609  Weight: 67.3 kg    Examination:  General exam: Appears anxious.  Tearful.  Complains of pain. Respiratory system: Clear to auscultation. Respiratory effort normal. Cardiovascular system: S1 & S2 heard, RRR. No JVD, murmurs, rubs, gallops or clicks. No pedal edema.  Pacemaker present.  Nontender. Gastrointestinal system: Abdomen is nondistended, soft . No organomegaly or masses felt. Normal bowel sounds heard.  Mild generalized tenderness.  Nephrostomy with clear urine. Central nervous system: Alert and oriented. No focal neurological deficits. Extremities: Symmetric 5 x 5 power. Skin: No rashes, lesions or ulcers Psychiatry: Judgement and insight appear normal.  She is anxious.    Data Reviewed: I have personally reviewed following labs and imaging studies  CBC: Recent Labs  Lab 04/11/19 0539 04/12/19 0440 04/14/19 0550 04/15/19 0534  WBC 4.8 3.5* 6.1 6.8  NEUTROABS 4.5 2.8 4.7 4.8  HGB 8.6* 8.5* 7.7* 8.2*  HCT 26.6* 26.7* 23.9* 25.0*  MCV 90.5 90.2 88.2 89.0  PLT 142* 106* 73* 81*   Basic Metabolic Panel: Recent Labs  Lab 04/13/19 1531 04/14/19 0550 04/15/19 0534 04/16/19 0532 04/17/19 0733  NA 129* 129* 130* 131* 133*  K 3.8 3.5 3.3* 3.1* 4.4  CL 96* 92* 89* 93* 96*  CO2 18* 25 30 28 25   GLUCOSE 112* 151* 112* 98 115*  BUN 108* 104* 91* 83* 72*  CREATININE 4.90* 4.66* 4.46* 4.25* 4.10*  CALCIUM 8.2* 8.0* 7.7* 7.7* 8.4*  MG  --   --  1.5*  --   --   PHOS  --   --  2.9 3.1 2.5   GFR: Estimated Creatinine Clearance: 11.2 mL/min (A) (by C-G formula based on SCr of 4.1 mg/dL (H)). Liver Function Tests: Recent Labs  Lab 04/16/19 0532 04/17/19 0733  ALBUMIN 1.9* 2.0*   No results for input(s): LIPASE, AMYLASE in the last 168 hours. No results for input(s): AMMONIA in the last 168 hours. Coagulation Profile: No results for input(s): INR, PROTIME in the last 168 hours. Cardiac Enzymes: No results for input(s): CKTOTAL, CKMB, CKMBINDEX, TROPONINI in the  last 168 hours. BNP (last 3 results) No results for input(s): PROBNP in the last 8760 hours. HbA1C: No results for input(s): HGBA1C in the last 72 hours. CBG: Recent Labs  Lab 04/16/19 0742 04/16/19 1153 04/16/19 1633 04/16/19 2102 04/17/19 0730  GLUCAP 111* 93 119* 114* 109*   Lipid Profile: No results for input(s): CHOL, HDL, LDLCALC, TRIG, CHOLHDL, LDLDIRECT in the last 72 hours. Thyroid Function Tests: No results for input(s): TSH, T4TOTAL, FREET4, T3FREE, THYROIDAB in the last 72 hours. Anemia Panel: Recent Labs    04/16/19 0532  FERRITIN 832*  TIBC 115*  IRON 50   Sepsis Labs: Recent Labs  Lab 04/15/19 1201 04/15/19 1212  PROCALCITON 5.46  --   LATICACIDVEN  --  0.9    Recent Results (from the past 240 hour(s))  Culture, Urine     Status: None   Collection Time: 04/10/19  1:22 PM  Result Value Ref Range Status   Specimen Description   Final    URINE, CLEAN CATCH Performed at [facility], [place] [address] [place], [place] [address]    Special Requests   Final    NONE Performed at [facility], [place]  [address] [place], [place] [address]    Culture   Final    NO GROWTH Performed at [facility], [place] [address] [place], [place] [address]    Report Status 04/11/2019 FINAL  Final  Culture, Urine     Status: None   Collection Time: 04/13/19  1:46 PM  Result Value Ref Range Status   Specimen Description   Final    URINE, CATHETERIZED Performed at [facility] [address], [place], [place] [address]    Special Requests   Final    Normal Performed at [facility], [place] [address], [place], [place] [address]    Culture   Final    Multiple bacterial morphotypes present, none predominant. Suggest appropriate recollection if clinically indicated.   Report Status 04/15/2019 FINAL  Final  Culture, blood (routine x 2)     Status: None (Preliminary result)   Collection Time:  04/15/19  9:55 AM  Result Value Ref Range Status   Specimen Description   Final    BLOOD RIGHT ARM Performed at [facility] [address], [place], [place] [address]    Special Requests   Final    BOTTLES DRAWN AEROBIC AND ANAEROBIC Blood Culture adequate volume Performed at [facility] [address], [place], [place] [address]    Culture   Final    NO GROWTH 2 DAYS Performed at [facility] [address], [place], [place] [address]    Report Status PENDING  Incomplete  Culture, blood (routine x 2)     Status: None (Preliminary result)   Collection Time: 04/15/19  9:56 AM  Result Value Ref Range Status   Specimen Description   Final    BLOOD LEFT HAND Performed at [facility] [address] [place], [place] [address]    Special Requests   Final    BOTTLES DRAWN AEROBIC AND ANAEROBIC Blood Culture adequate volume Performed at [facility] [address] [place], [place] [address]    Culture   Final    NO GROWTH 2 DAYS Performed at [facility] [address], [place],  [address]    Report Status PENDING  Incomplete         Radiology Studies: No results found.      Scheduled Meds: . darifenacin  7.5 mg Oral Daily  . ferrous sulfate  325 mg Oral BID WC  . folic acid  1 mg Oral Daily  . gabapentin  300 mg Oral Daily  . heparin  5,000 Units Subcutaneous Q8H  . insulin aspart  0-9 Units Subcutaneous TID WC  . levothyroxine  75 mcg Oral QAC breakfast  . pantoprazole  40 mg Oral BID  . simvastatin  40 mg Oral QHS  . vitamin B-12  1,000 mcg Oral Daily   Continuous Infusions:    LOS: 7 days    Time spent: 25 minutes    Barb Merino, MD [facility] Pager [phone]  If 7PM-7AM, please contact night-coverage [email or web address] Password [email or web address] 04/17/2019, 11:00 AM

## 2019-04-17 NOTE — Progress Notes (Signed)
Subjective:  Temp curve better- BP better as well 2600 of UOP recorded on no lasix , crt down again.  Mobility appears to be a major issue  Objective Vital signs in last 24 hours: Vitals:   04/16/19 0531 04/16/19 1341 04/16/19 2105 04/17/19 0521  BP: (!) 105/57 (!) 112/56 114/64 (!) 110/51  Pulse: 77 84 81 73  Resp: 20 20  18   Temp: 98.8 F (37.1 C) 98.8 F (37.1 C) 98.7 F (37.1 C) 98.4 F (36.9 C)  TempSrc: Oral Oral Oral Oral  SpO2: 95% 95% 96% 96%  Weight:      Height:       Weight change:   Intake/Output Summary (Last 24 hours) at 04/17/2019 1052 Last data filed at 04/17/2019 4401 Gross per 24 hour  Intake 940.06 ml  Output 2240 ml  Net -1299.94 ml    Assessment/ Plan: Pt is a 73 y.o. yo female with stage 4 CKD as OP - recent nephrostomy tube who was admitted on 04/09/2019 with pain from nephrostomy tube   Assessment/Plan: 1. A on CRF- crt lower than it has been in some time, BUN well under 100.  great UOP- - difficult to gauge uremic sxms as I suspect never says feels good   2. Anemia-  iron stores OK  - given ESA on 5/14 3. Secondary hyperparathyroidism- phos is low/good, calc lowish as well- no treatment 4. HTN/volume- BP lowish- no BP meds- suspect pain meds ?  are keeping it low.  Sodium is improving daily with her autodiuresis  5. Fever- urine culture mult species- blood cultures pending - meropenam ordered- now stopped - no fever last 48 hours  6. Hypokalemia - repletion ordered on 5/15- none needed today   7. Dispo - possible discharge soon but mobility seems to be an issue.   I have no issue with that as kidney function appears to be trending better - will make sure is set up to see Johnney Ou as an OP  Renal will follow at a distance, watch labs  and not see daily.  Call with questions   Louis Meckel    Labs: Basic Metabolic Panel: Recent Labs  Lab 04/15/19 0534 04/16/19 0532 04/17/19 0733  NA 130* 131* 133*  K 3.3* 3.1* 4.4  CL 89* 93* 96*  CO2  30 28 25   GLUCOSE 112* 98 115*  BUN 91* 83* 72*  CREATININE 4.46* 4.25* 4.10*  CALCIUM 7.7* 7.7* 8.4*  PHOS 2.9 3.1 2.5   Liver Function Tests: Recent Labs  Lab 04/16/19 0532 04/17/19 0733  ALBUMIN 1.9* 2.0*   No results for input(s): LIPASE, AMYLASE in the last 168 hours. No results for input(s): AMMONIA in the last 168 hours. CBC: Recent Labs  Lab 04/11/19 0539 04/12/19 0440 04/14/19 0550 04/15/19 0534  WBC 4.8 3.5* 6.1 6.8  NEUTROABS 4.5 2.8 4.7 4.8  HGB 8.6* 8.5* 7.7* 8.2*  HCT 26.6* 26.7* 23.9* 25.0*  MCV 90.5 90.2 88.2 89.0  PLT 142* 106* 73* 81*   Cardiac Enzymes: No results for input(s): CKTOTAL, CKMB, CKMBINDEX, TROPONINI in the last 168 hours. CBG: Recent Labs  Lab 04/16/19 0742 04/16/19 1153 04/16/19 1633 04/16/19 2102 04/17/19 0730  GLUCAP 111* 93 119* 114* 109*    Iron Studies:  Recent Labs    04/16/19 0532  IRON 50  TIBC 115*  FERRITIN 832*   Studies/Results: No results found. Medications: Infusions:   Scheduled Medications: . darifenacin  7.5 mg Oral Daily  . ferrous sulfate  325 mg Oral BID WC  . folic acid  1 mg Oral Daily  . gabapentin  300 mg Oral Daily  . heparin  5,000 Units Subcutaneous Q8H  . insulin aspart  0-9 Units Subcutaneous TID WC  . levothyroxine  75 mcg Oral QAC breakfast  . pantoprazole  40 mg Oral BID  . simvastatin  40 mg Oral QHS  . vitamin B-12  1,000 mcg Oral Daily    have reviewed scheduled and prn medications.  Physical Exam: General: non toxic appearing - happy that things look better  Heart: RRR Lungs: mostly clear  Abdomen: soft, non tender Extremities: mild edema     04/17/2019,10:52 AM  LOS: 7 days

## 2019-04-18 LAB — RENAL FUNCTION PANEL
Albumin: 2.1 g/dL — ABNORMAL LOW (ref 3.5–5.0)
Anion gap: 13 (ref 5–15)
BUN: 71 mg/dL — ABNORMAL HIGH (ref 8–23)
CO2: 25 mmol/L (ref 22–32)
Calcium: 8.4 mg/dL — ABNORMAL LOW (ref 8.9–10.3)
Chloride: 95 mmol/L — ABNORMAL LOW (ref 98–111)
Creatinine, Ser: 4.36 mg/dL — ABNORMAL HIGH (ref 0.44–1.00)
GFR calc Af Amer: 11 mL/min — ABNORMAL LOW (ref >60)
GFR calc non Af Amer: 9 mL/min — ABNORMAL LOW (ref >60)
Glucose, Bld: 100 mg/dL — ABNORMAL HIGH (ref 70–99)
Phosphorus: 2.6 mg/dL (ref 2.5–4.6)
Potassium: 4.5 mmol/L (ref 3.5–5.1)
Sodium: 133 mmol/L — ABNORMAL LOW (ref 135–145)

## 2019-04-18 LAB — GLUCOSE, CAPILLARY
Glucose-Capillary: 101 mg/dL — ABNORMAL HIGH (ref 70–99)
Glucose-Capillary: 112 mg/dL — ABNORMAL HIGH (ref 70–99)
Glucose-Capillary: 103 mg/dL — ABNORMAL HIGH (ref 70–99)
Glucose-Capillary: 93 mg/dL (ref 70–99)

## 2019-04-18 MED ORDER — ENSURE ENLIVE PO LIQD
237.0000 mL | Freq: Two times a day (BID) | ORAL | Status: DC
  Administered 2019-04-18 – 2019-04-20 (×3): 237 mL via ORAL

## 2019-04-18 MED ORDER — ADULT MULTIVITAMIN W/MINERALS CH
1.0000 | ORAL_TABLET | Freq: Every day | ORAL | Status: DC
  Administered 2019-04-19 – 2019-04-21 (×3): 1 via ORAL
  Filled 2019-04-18 (×3): qty 1

## 2019-04-18 NOTE — Progress Notes (Signed)
PMT progress note  Patient is resting in bed, in no distress, does have ongoing back/pelvic discomfort.   BP 127/60   Pulse 73   Temp 98.5 F (36.9 C)   Resp 18   Ht 5\' 4"  (1.626 m)   Wt 67.3 kg   SpO2 94%   BMI 25.47 kg/m  Labs and imaging reviewed  MRI pending, patient likely to go to [facility] for MRI due to pacemaker.   Resting in bed, awakens when name called In no distress Regular S1 S2 No edema Abdomen not distended  PPS 40%   Intractable back pain: med history noted, opioid use noted. Continue to monitor. MRI pending.  AKI on IV CKD: was given IVF and bicarb, renal was following.  R hydronephrosis and UPJ obstruction, was seeing urology as outpatient, has nephrostomy tube.  Ongoing discussions with patient and family regarding safe disposition,[facility] inpatient rehab consulted as per family request.   25 minutes spent Loistine Chance MD [facility] health palliative medicine team [phone]

## 2019-04-18 NOTE — Progress Notes (Signed)
Assumed care of pt at 0200. Agree with previous RN assessment. Pt has no concerns at this time. Will continue to monitor.

## 2019-04-18 NOTE — Progress Notes (Signed)
Initial Nutrition Assessment  RD working remotely.   DOCUMENTATION CODES:   (unable to assess for malnutrition at this time.)  INTERVENTION:  - will order Ensure Enlive BID, each supplement provides 350 kcal and 20 grams of protein. - will order daily multivitamin with minerals. - continue to encourage PO intakes.  - weigh patient today.   NUTRITION DIAGNOSIS:   Inadequate oral intake related to acute illness, decreased appetite as evidenced by per patient/family report.  GOAL:   Patient will meet greater than or equal to 90% of their needs  MONITOR:   PO intake, Supplement acceptance, Labs, Weight trends  REASON FOR ASSESSMENT:   Malnutrition Screening Tool  ASSESSMENT:   73 year old female with history of chronic interstitial cystitis, chronic pelvic pain, stage 4 CKD d/t HTN, type 2 DM on insulin, complete heart block s/p dual-chamber pacemaker, CHF, hypothyroidism, and anemia, osteopenia, and venous insufficiency. She was recently diagnosed with moderate to marked chronic dilation of the R renal collecting system and Enterobacter/enterococcus UTI that required hospitalization in 01/2019. As an outpatient she was found to have R hydronephrosis d/t UPJ obstruction, she did not tolerate a stent placement, she was referred to IR for a percutaneous nephrostomy tube placement. Patient began complaining of severe R flank pain.  Patient has not been weighed since admission (5/8). She has been eating better as length of admission increases. Most recently, she consumed 30% of breakfast, 50% of lunch, and 25% of dinner on 5/15; 75% of breakfast and 50% of lunch on 5/16; 75% of breakfast this AM.   Per chart review, current weight is 148 lb and weight on 02/18/2019 was 170 lb. This indicates 22 lb weight loss (13% body weight) in the past 2 months; this is significant for time frame. Suspect patient with some degree of malnutrition but unable to confirm at this time.   Per notes: AKI on  stage 4 CKD complicated by hyponatremia, hyperkalemia, and metabolic acidosis--improving, intractable back pain and pelvic pain with interstitial cystitis with plan for repeat MRI of lumbar spine. A referral was placed for acute rehab following d/c.    Medications reviewed; 325 mg ferrous sulfate BID, 1 mg folvite/day, sliding scale novolog, 75 mcg oral synthroid/day, 40 mg oral protonix BID, 1000 mcg oral cyanocobalamin/day.  Labs reviewed; CBG: 101 mg/dl today, Na: 133 mmol/l, Cl: 95 mmol/l, BUN: 71 mg/dl, creatinine: 4.36 mg/dl, Ca: 8.4 mg/dl, GFR: 9 ml/min.      NUTRITION - FOCUSED PHYSICAL EXAM:  unable to complete at this time.   Diet Order:   Diet Order            Diet heart healthy/carb modified Room service appropriate? Yes; Fluid consistency: Thin; Fluid restriction: 1200 mL Fluid  Diet effective now              EDUCATION NEEDS:   Not appropriate for education at this time  Skin:  Skin Assessment: Reviewed RN Assessment  Last BM:  5/16  Height:   Ht Readings from Last 1 Encounters:  04/09/19 5\' 4"  (1.626 m)    Weight:   Wt Readings from Last 1 Encounters:  04/09/19 67.3 kg    Ideal Body Weight:  54.5 kg  BMI:  Body mass index is 25.47 kg/m.  Estimated Nutritional Needs:   Kcal:  2992-4268 kcal  Protein:  65-80 grams  Fluid:  >/= 1.8 L/day      Jarome Matin, MS, RD, LDN, [facility] Inpatient Clinical Dietitian Pager # [phone] After hours/weekend pager # [phone]

## 2019-04-18 NOTE — Progress Notes (Signed)
PROGRESS NOTE    Erika Fry  JWJ:[id number] DOB: July 13, 1946 DOA: 04/09/2019 PCP: [name], MD    Brief Narrative:   73 year old female with history of chronic interstitial cystitis, chronic pelvic pain, history of lumbar discitis treated with prolonged antibiotics, chronic kidney disease due to hypertension , stage IV with baseline creatinine about 3-3.6, type 2 diabetes on insulin, complete heart block status post dual-chamber pacemaker, diastolic heart failure , hypothyroidism and anemia who was brought to interventional radiology for right nephrostomy tube placement and found to have acute kidney injury as well as severe persistent pain so she was admitted to the hospital.   Patient remains in the hospital and continues to have pelvic pain. Pain control remains an issue.  Assessment & Plan:   Principal Problem:   Hydronephrosis Active Problems:   Mobitz type 2 second degree AV block   CKD (chronic kidney disease) stage 4, GFR 15-29 ml/min (HCC)   Diet-controlled diabetes mellitus (HCC)   Essential hypertension   Interstitial cystitis   AKI (acute kidney injury) ([place])   Acquired hydronephrosis with ureteropelvic junction (UPJ) obstruction   Pain in joint involving pelvic region and thigh   Dysuria   Palliative care by specialist  Acute kidney injury on stage IV chronic kidney disease complicated with hyponatremia, hyperkalemia and metabolic acidosis:  Treated with bicarbonate infusion. Now off IV fluids. Renal functions appropriately improving. Her nephrostomy tube is draining. renal functions fluctuating however no worsening.    Febrile episode:  Patient had episodes of low blood pressure.  Temperature recorded 10 2 at night.  Was treated as sepsis.  History of enterococcus and enterobacteria in urine. Treated with meropenem.   Blood cultures and urine cultures negative.  Lactic acid and procalcitonin normal.  Antibiotics discontinued.    Intractable back  pain, pelvic pain with interstitial cystitis: Treated with multiple agents including oxycodone, oxybutynin and darifenacin.  Pain control issues remain.  Appreciate palliative care input. On increasing dose of oral oxycodone and on fentanyl. Patient does have history of lumbar discitis and significant pelvic pain. We will repeat MRI of the lumbar spine.  She has a pacemaker which is compatible, however she needs to be transferred to [facility] for MRI.  Right-sided hydronephrosis with UPJ obstruction: Obstruction due to?  Interstitial cystitis and scarring.  Nephrostomy tube draining well. Ambulate.  Foley catheter was placed suspecting of more obstruction.  Removed and urinating well.  Mobitz type II second-degree AV block status post pacemaker: Is stable.  Diet controlled type 2 diabetes: On sliding scale in the hospital.  Hypothyroidism: On Synthroid.  Euthyroid.  Advance activities.  Work with PT OT.   DVT prophylaxis: Heparin subcu Code Status: Full code Family Communication: Patient to communicate with family Disposition Plan: Inpatient to complete work-up. Acute rehab admission referral done.   Consultants:   Urology, signed off for outpatient follow-up  Nephrology,  Palliative care  Procedures:   Right nephrostomy by IR  Antimicrobials: Meropenem, 04/15/2019--- 5/16/ 2020   Subjective: Patient seen and examined.  No overnight events.  Patient is stated better pain control after injection last night.  Objective: Vitals:   04/17/19 1231 04/17/19 1513 04/17/19 2041 04/18/19 0522  BP:  104/65 (!) 96/55 127/60  Pulse:  75 84 73  Resp:  20 16 18   Temp:  98.3 F (36.8 C) 98.7 F (37.1 C) 98.5 F (36.9 C)  TempSrc:  Oral Oral   SpO2: 95%  96% 94%  Weight:      Height:  Intake/Output Summary (Last 24 hours) at 04/18/2019 1119 Last data filed at 04/18/2019 1054 Gross per 24 hour  Intake 1080 ml  Output 800 ml  Net 280 ml   Filed Weights   04/09/19  1609  Weight: 67.3 kg    Examination:  Physical Exam  Constitutional: She is oriented to person, place, and time. She appears well-developed and well-nourished. No distress.  HENT:  Head: Normocephalic and atraumatic.  Eyes: Pupils are equal, round, and reactive to light. Conjunctivae are normal. Right eye exhibits no discharge. Left eye exhibits no discharge.  Neck: Normal range of motion. Neck supple. Thyromegaly present.  Cardiovascular: Normal rate, regular rhythm and normal heart sounds.  Pulmonary/Chest: Effort normal and breath sounds normal.  Abdominal: Soft. Bowel sounds are normal. There is no abdominal tenderness.  Musculoskeletal: Normal range of motion.        General: Edema present.     Comments: She does have hyperesthesia mostly on the right leg.  Neurological: She is alert and oriented to person, place, and time.  Skin: Skin is warm and dry.  Psychiatric: She has a normal mood and affect. Her behavior is normal. Judgment normal.   Right nephrostomy tube intact and draining.  Pacemaker intact and nontender.   Data Reviewed: I have personally reviewed following labs and imaging studies  CBC: Recent Labs  Lab 04/12/19 0440 04/14/19 0550 04/15/19 0534  WBC 3.5* 6.1 6.8  NEUTROABS 2.8 4.7 4.8  HGB 8.5* 7.7* 8.2*  HCT 26.7* 23.9* 25.0*  MCV 90.2 88.2 89.0  PLT 106* 73* 81*   Basic Metabolic Panel: Recent Labs  Lab 04/14/19 0550 04/15/19 0534 04/16/19 0532 04/17/19 0733 04/18/19 0502  NA 129* 130* 131* 133* 133*  K 3.5 3.3* 3.1* 4.4 4.5  CL 92* 89* 93* 96* 95*  CO2 25 30 28 25 25   GLUCOSE 151* 112* 98 115* 100*  BUN 104* 91* 83* 72* 71*  CREATININE 4.66* 4.46* 4.25* 4.10* 4.36*  CALCIUM 8.0* 7.7* 7.7* 8.4* 8.4*  MG  --  1.5*  --   --   --   PHOS  --  2.9 3.1 2.5 2.6   GFR: Estimated Creatinine Clearance: 10.5 mL/min (A) (by C-G formula based on SCr of 4.36 mg/dL (H)). Liver Function Tests: Recent Labs  Lab 04/16/19 0532 04/17/19 0733  04/18/19 0502  ALBUMIN 1.9* 2.0* 2.1*   No results for input(s): LIPASE, AMYLASE in the last 168 hours. No results for input(s): AMMONIA in the last 168 hours. Coagulation Profile: No results for input(s): INR, PROTIME in the last 168 hours. Cardiac Enzymes: No results for input(s): CKTOTAL, CKMB, CKMBINDEX, TROPONINI in the last 168 hours. BNP (last 3 results) No results for input(s): PROBNP in the last 8760 hours. HbA1C: No results for input(s): HGBA1C in the last 72 hours. CBG: Recent Labs  Lab 04/17/19 0730 04/17/19 1130 04/17/19 1642 04/17/19 2036 04/18/19 0739  GLUCAP 109* 139* 172* 115* 101*   Lipid Profile: No results for input(s): CHOL, HDL, LDLCALC, TRIG, CHOLHDL, LDLDIRECT in the last 72 hours. Thyroid Function Tests: No results for input(s): TSH, T4TOTAL, FREET4, T3FREE, THYROIDAB in the last 72 hours. Anemia Panel: Recent Labs    04/16/19 0532  FERRITIN 832*  TIBC 115*  IRON 50   Sepsis Labs: Recent Labs  Lab 04/15/19 1201 04/15/19 1212  PROCALCITON 5.46  --   LATICACIDVEN  --  0.9    Recent Results (from the past 240 hour(s))  Culture, Urine     Status:  None   Collection Time: 04/10/19  1:22 PM  Result Value Ref Range Status   Specimen Description   Final    URINE, CLEAN CATCH Performed at [facility], [place] [address] [place], [place] [address]    Special Requests   Final    NONE Performed at [facility], [place] [address], [place], [place] [address]    Culture   Final    NO GROWTH Performed at [facility], [place] [address] [place], [place] [address]    Report Status 04/11/2019 FINAL  Final  Culture, Urine     Status: None   Collection Time: 04/13/19  1:46 PM  Result Value Ref Range Status   Specimen Description   Final    URINE, CATHETERIZED Performed at [place] [address] [place], [place] [address]    Special Requests   Final    Normal Performed at [facility], [place] [address], [place], [place] [address]    Culture   Final    Multiple bacterial morphotypes present, none predominant. Suggest appropriate recollection if clinically indicated.   Report Status 04/15/2019 FINAL  Final  Culture, blood (routine x 2)     Status: None (Preliminary result)   Collection Time: 04/15/19  9:55 AM  Result Value Ref Range Status   Specimen Description   Final    BLOOD RIGHT ARM Performed at [place] [address] [place], [place] [address]    Special Requests   Final    BOTTLES DRAWN AEROBIC AND ANAEROBIC Blood Culture adequate volume Performed at [place] [address], [place], [place] [address]    Culture   Final    NO GROWTH 2 DAYS Performed at [place] [address] [place], [place] [address]    Report Status PENDING  Incomplete  Culture, blood (routine x 2)     Status: None (Preliminary result)   Collection Time: 04/15/19  9:56 AM  Result Value Ref Range Status   Specimen Description   Final    BLOOD LEFT HAND Performed at [place] [address], [place], [place] [address]    Special Requests   Final    BOTTLES DRAWN AEROBIC AND ANAEROBIC Blood Culture adequate volume Performed at [place] [address] [place], [place] [address]    Culture   Final    NO GROWTH 2 DAYS Performed at [place] [address], [place], [place] [address]    Report Status PENDING  Incomplete         Radiology Studies: No results found.      Scheduled Meds: . darifenacin  7.5 mg Oral Daily  . ferrous sulfate  325 mg Oral BID WC  . folic acid  1 mg Oral Daily  . gabapentin  300 mg Oral Daily  . heparin  5,000 Units Subcutaneous Q8H  . insulin aspart  0-9 Units Subcutaneous TID WC  . levothyroxine  75 mcg Oral QAC breakfast  . pantoprazole  40 mg Oral BID  . simvastatin  40 mg Oral QHS  . vitamin B-12   1,000 mcg Oral Daily   Continuous Infusions:    LOS: 8 days    Time spent: 25 minutes    Barb Merino, MD [facility] Pager [phone]  If 7PM-7AM, please contact night-coverage [email or web address] Password [email or web address] 04/18/2019, 11:19 AM

## 2019-04-19 ENCOUNTER — Ambulatory Visit (HOSPITAL_COMMUNITY)
Admission: RE | Admit: 2019-04-19 | Discharge: 2019-04-19 | Disposition: A | Discharge status: Home / Self Care | Payer: Medicare Other | Source: Ambulatory Visit | Attending: Internal Medicine | Admitting: Internal Medicine

## 2019-04-19 LAB — RENAL FUNCTION PANEL
Albumin: 2.1 g/dL — ABNORMAL LOW (ref 3.5–5.0)
Anion gap: 10 (ref 5–15)
BUN: 74 mg/dL — ABNORMAL HIGH (ref 8–23)
CO2: 24 mmol/L (ref 22–32)
Calcium: 8.7 mg/dL — ABNORMAL LOW (ref 8.9–10.3)
Chloride: 97 mmol/L — ABNORMAL LOW (ref 98–111)
Creatinine, Ser: 4.2 mg/dL — ABNORMAL HIGH (ref 0.44–1.00)
GFR calc Af Amer: 11 mL/min — ABNORMAL LOW (ref >60)
GFR calc non Af Amer: 10 mL/min — ABNORMAL LOW (ref >60)
Glucose, Bld: 94 mg/dL (ref 70–99)
Phosphorus: 3.1 mg/dL (ref 2.5–4.6)
Potassium: 4.1 mmol/L (ref 3.5–5.1)
Sodium: 131 mmol/L — ABNORMAL LOW (ref 135–145)

## 2019-04-19 LAB — GLUCOSE, CAPILLARY
Glucose-Capillary: 82 mg/dL (ref 70–99)
Glucose-Capillary: 107 mg/dL — ABNORMAL HIGH (ref 70–99)
Glucose-Capillary: 99 mg/dL (ref 70–99)
Glucose-Capillary: 137 mg/dL — ABNORMAL HIGH (ref 70–99)

## 2019-04-19 NOTE — Progress Notes (Signed)
PROGRESS NOTE    Erika Fry  MGQ:[id number] DOB: 07/27/1946 DOA: 04/09/2019 PCP: [name], MD    Brief Narrative:   73 year old female with history of chronic interstitial cystitis, chronic pelvic pain, history of lumbar discitis treated with prolonged antibiotics, chronic kidney disease due to hypertension , stage IV with baseline creatinine about 3-3.6, type 2 diabetes on insulin, complete heart block status post dual-chamber pacemaker, diastolic heart failure , hypothyroidism and anemia who was brought to interventional radiology for right nephrostomy tube placement and found to have acute kidney injury as well as severe persistent pain so she was admitted to the hospital.   Patient remains in the hospital and continues to have pelvic pain. Pain control remains an issue and followed by palliative care.  Rehab assessment pending   Assessment & Plan:   Principal Problem:   Hydronephrosis Active Problems:   Mobitz type 2 second degree AV block   CKD (chronic kidney disease) stage 4, GFR 15-29 ml/min (HCC)   Diet-controlled diabetes mellitus (HCC)   Essential hypertension   Interstitial cystitis   AKI (acute kidney injury) ([place])   Acquired hydronephrosis with ureteropelvic junction (UPJ) obstruction   Pain in joint involving pelvic region and thigh   Dysuria   Palliative care by specialist  Acute kidney injury on stage IV chronic kidney disease complicated with hyponatremia, hyperkalemia and metabolic acidosis:  Treated with bicarbonate infusion. Now off IV fluids. Renal functions appropriately improving. Her nephrostomy tube is draining. renal functions fluctuating however no worsening.  She will have outpatient follow-up by nephrology.  Febrile episode: Improved now.  No focus of infection found.  Was treated with antibiotics and now discontinued.  Intractable back pain, pelvic pain with interstitial cystitis: Treated with multiple agents including oxycodone,  oxybutynin and darifenacin.  Pain control issues remain.  Appreciate palliative care input. On increasing dose of oral oxycodone and on fentanyl. Patient does have history of lumbar discitis and significant pelvic pain. We will repeat MRI of the lumbar spine.  She has a pacemaker which is compatible, however she needs to be transferred to [facility] for MRI.  Right-sided hydronephrosis with UPJ obstruction: Obstruction due to?  Interstitial cystitis and scarring.  Nephrostomy tube draining well. Ambulate.  Foley catheter was placed suspecting of more obstruction.  Removed and urinating well.  Mobitz type II second-degree AV block status post pacemaker: Is stable.  Diet controlled type 2 diabetes: On sliding scale in the hospital.  Hypothyroidism: On Synthroid.  Euthyroid.  Advance activities.  Continue to work with PT OT. CIR referral. Discharge to either SNF or acute rehab if MRI does not show any significant findings.  DVT prophylaxis: Heparin subcu Code Status: Full code Family Communication: Patient to communicate with family Disposition Plan: Inpatient to complete work-up. Acute rehab admission referral done.   Consultants:   Urology, signed off for outpatient follow-up  Nephrology,  Palliative care  Procedures:   Right nephrostomy by IR  Antimicrobials: Meropenem, 04/15/2019--- 5/16/ 2020   Subjective: Patient seen and examined.  No overnight events.  She has some left knee pain today.  Continues to have back pain, however did not use any injectable pain medicine for last 24 hours.  Objective: Vitals:   04/18/19 0522 04/18/19 1535 04/18/19 2052 04/19/19 0522  BP: 127/60 101/85 104/75 100/75  Pulse: 73 75 73 65  Resp: 18 18 18 18   Temp: 98.5 F (36.9 C)  98 F (36.7 C) 98.7 F (37.1 C)  TempSrc:    Oral  SpO2: 94% 95% 97% 98%  Weight:      Height:        Intake/Output Summary (Last 24 hours) at 04/19/2019 1029 Last data filed at 04/19/2019 3295 Gross per  24 hour  Intake 440 ml  Output 1325 ml  Net -885 ml   Filed Weights   04/09/19 1609  Weight: 67.3 kg    Examination:  Physical Exam  Constitutional: She is oriented to person, place, and time. She appears well-developed and well-nourished. No distress.  Chronically sick looking but currently comfortable.  HENT:  Head: Normocephalic and atraumatic.  Mouth/Throat: No oropharyngeal exudate.  Eyes: Pupils are equal, round, and reactive to light. EOM are normal.  Neck: Normal range of motion. Neck supple.  Cardiovascular: Normal rate and normal heart sounds. Exam reveals no friction rub.  No murmur heard. Respiratory: Effort normal and breath sounds normal. No respiratory distress.  GI: Soft. Bowel sounds are normal.  No localized tenderness or guarding.  Musculoskeletal: Normal range of motion.     Comments: Patient has chronic non-pedal edema and hyperesthesia.  Lymphadenopathy:    She has no cervical adenopathy.  Neurological: She is alert and oriented to person, place, and time.  Skin: Skin is warm and dry.  Psychiatric: She has a normal mood and affect. Judgment normal.   Right-sided nephrostomy tube draining freely.  Pacemaker left precordium nontender.  Data Reviewed: I have personally reviewed following labs and imaging studies  CBC: Recent Labs  Lab 04/14/19 0550 04/15/19 0534  WBC 6.1 6.8  NEUTROABS 4.7 4.8  HGB 7.7* 8.2*  HCT 23.9* 25.0*  MCV 88.2 89.0  PLT 73* 81*   Basic Metabolic Panel: Recent Labs  Lab 04/15/19 0534 04/16/19 0532 04/17/19 0733 04/18/19 0502 04/19/19 0431  NA 130* 131* 133* 133* 131*  K 3.3* 3.1* 4.4 4.5 4.1  CL 89* 93* 96* 95* 97*  CO2 30 28 25 25 24   GLUCOSE 112* 98 115* 100* 94  BUN 91* 83* 72* 71* 74*  CREATININE 4.46* 4.25* 4.10* 4.36* 4.20*  CALCIUM 7.7* 7.7* 8.4* 8.4* 8.7*  MG 1.5*  --   --   --   --   PHOS 2.9 3.1 2.5 2.6 3.1   GFR: Estimated Creatinine Clearance: 10.9 mL/min (A) (by C-G formula based on SCr of 4.2  mg/dL (H)). Liver Function Tests: Recent Labs  Lab 04/16/19 0532 04/17/19 0733 04/18/19 0502 04/19/19 0431  ALBUMIN 1.9* 2.0* 2.1* 2.1*   No results for input(s): LIPASE, AMYLASE in the last 168 hours. No results for input(s): AMMONIA in the last 168 hours. Coagulation Profile: No results for input(s): INR, PROTIME in the last 168 hours. Cardiac Enzymes: No results for input(s): CKTOTAL, CKMB, CKMBINDEX, TROPONINI in the last 168 hours. BNP (last 3 results) No results for input(s): PROBNP in the last 8760 hours. HbA1C: No results for input(s): HGBA1C in the last 72 hours. CBG: Recent Labs  Lab 04/18/19 0739 04/18/19 1226 04/18/19 1634 04/18/19 2050 04/19/19 0719  GLUCAP 101* 112* 103* 93 82   Lipid Profile: No results for input(s): CHOL, HDL, LDLCALC, TRIG, CHOLHDL, LDLDIRECT in the last 72 hours. Thyroid Function Tests: No results for input(s): TSH, T4TOTAL, FREET4, T3FREE, THYROIDAB in the last 72 hours. Anemia Panel: No results for input(s): VITAMINB12, FOLATE, FERRITIN, TIBC, IRON, RETICCTPCT in the last 72 hours. Sepsis Labs: Recent Labs  Lab 04/15/19 1201 04/15/19 1212  PROCALCITON 5.46  --   LATICACIDVEN  --  0.9    Recent Results (from  the past 240 hour(s))  Culture, Urine     Status: None   Collection Time: 04/10/19  1:22 PM  Result Value Ref Range Status   Specimen Description   Final    URINE, CLEAN CATCH Performed at [facility] [address] [place], [place] [address]    Special Requests   Final    NONE Performed at [facility], [place] [address] [place], [place] [address]    Culture   Final    NO GROWTH Performed at [facility], [place] [address] [place], [place] [address]    Report Status 04/11/2019 FINAL  Final  Culture, Urine     Status: None   Collection Time: 04/13/19  1:46 PM  Result Value Ref Range Status   Specimen Description   Final    URINE, CATHETERIZED Performed at [facility] [name], [place], [place] [address]    Special Requests   Final    Normal Performed at [facility], [place] [address] [place], [place] [address]    Culture   Final    Multiple bacterial morphotypes present, none predominant. Suggest appropriate recollection if clinically indicated.   Report Status 04/15/2019 FINAL  Final  Culture, blood (routine x 2)     Status: None (Preliminary result)   Collection Time: 04/15/19  9:55 AM  Result Value Ref Range Status   Specimen Description   Final    BLOOD RIGHT ARM Performed at [facility] [address], [place], [place] [address]    Special Requests   Final    BOTTLES DRAWN AEROBIC AND ANAEROBIC Blood Culture adequate volume Performed at [facility] [address] [place], [place] [address]    Culture   Final    NO GROWTH 3 DAYS Performed at [facility], [place] [address], [place], [place] [address]    Report Status PENDING  Incomplete  Culture, blood (routine x 2)     Status: None (Preliminary result)   Collection Time: 04/15/19  9:56 AM  Result Value Ref Range Status   Specimen Description   Final    BLOOD LEFT HAND Performed at [facility] [address] [place], [place] [address]    Special Requests   Final    BOTTLES DRAWN AEROBIC AND ANAEROBIC Blood Culture adequate volume Performed at [facility] [address] [place], [place] [address]    Culture   Final    NO GROWTH 3 DAYS Performed at [facility], [place] [address], [place], [place] [address]    Report Status PENDING  Incomplete         Radiology Studies: No results found.      Scheduled Meds: . darifenacin  7.5 mg Oral Daily  . feeding supplement (ENSURE ENLIVE)  237 mL Oral BID BM  . ferrous sulfate  325 mg Oral BID WC  . folic acid  1 mg Oral Daily  . gabapentin  300 mg Oral Daily  . heparin  5,000 Units  Subcutaneous Q8H  . insulin aspart  0-9 Units Subcutaneous TID WC  . levothyroxine  75 mcg Oral QAC breakfast  . multivitamin with minerals  1 tablet Oral Daily  . pantoprazole  40 mg Oral BID  . simvastatin  40 mg Oral QHS  . vitamin B-12  1,000 mcg Oral Daily   Continuous Infusions:    LOS: 9 days  Time spent: 25 minutes    Barb Merino, MD [facility] Pager [phone]  If 7PM-7AM, please contact night-coverage [email or web address] Password [facility] 04/19/2019, 10:29 AM

## 2019-04-19 NOTE — Care Management Important Message (Signed)
Important Message  Patient Details IM Letter given to [facility] Case Manager to present to the Patient Name: KENYIA WAMBOLT MRN: [id number] Date of Birth: May 21, 1946   Medicare Important Message Given:  Yes    Kerin Salen 04/19/2019, 10:48 AM

## 2019-04-19 NOTE — Progress Notes (Signed)
Physical Therapy Treatment Patient Details Name: Erika Fry MRN: [id number] DOB: [birth year] Today's Date: 04/19/2019    History of Present Illness 73 yo female admitted with hydronephrosis s/p R nephrostomy tube/drain placement. Hx of neuropathy, DM, CKD, obesity, gout, OA, LE edema, osteopenia, venous insufficiency    PT Comments    Pt limited by knee pain with increased time to complete bed mobility and transfer training.  Min-->mod A required to assist with bed mobility mainly with cueing to improve trunk rotation to assist with supine to sit and therapist assisted with LE.  Noted decreased O2 saturation to 86% following transfer to chair.  RN aware though pt able to return to 98% on room air with short duration.  Pt limited by dizziness as well following transfer from chair to bed.  RN aware of status.  EOS pt left in bed with bed alarm set, call bell within reach and NT in room to replace pure wick.  Pt did reports decreased in knee pain following gentle therex.   Follow Up Recommendations  SNF     Equipment Recommendations  None recommended by PT    Recommendations for Other Services       Precautions / Restrictions Precautions Precautions: Fall Precaution Comments: R neph tube Restrictions Weight Bearing Restrictions: No    Mobility  Bed Mobility Overal bed mobility: Needs Assistance Bed Mobility: Supine to Sit;Sit to Supine   Sidelying to sit: Mod assist Supine to sit: Mod assist     General bed mobility comments: Assist for legs and trunk  Transfers Overall transfer level: Needs assistance Equipment used: Rolling walker (2 wheeled) Transfers: Sit to/from Stand Sit to Stand: Mod assist;Min assist;From elevated surface         General transfer comment: NT 2* pt c/o increasing dizziness and noted fall in BP  Ambulation/Gait Ambulation/Gait assistance: Min assist Gait Distance (Feet): 3 Feet Assistive device: Rolling walker (2 wheeled) Gait  Pattern/deviations: Decreased step length - right;Decreased step length - left     General Gait Details: Pt took 3-4 small steps infront of bed to chair with RW.   Stairs             Wheelchair Mobility    Modified Rankin (Stroke Patients Only)       Balance                                            Cognition Arousal/Alertness: Awake/alert   Overall Cognitive Status: Within Functional Limits for tasks assessed                                 General Comments: Limited by weakness and pain, increased time to complete.  Also c/o dizziness following transfer to chair/bed      Exercises General Exercises - Lower Extremity Ankle Circles/Pumps: AROM;Both;10 reps;Supine Quad Sets: Both;10 reps;Supine Heel Slides: AROM;Both;5 reps;Supine    General Comments        Pertinent Vitals/Pain Pain Score: 10-Worst pain ever Pain Location: Bil knees Lt>Rt and R side following sitting  Pain Descriptors / Indicators: Grimacing;Moaning;Discomfort Pain Intervention(s): Monitored during session;Limited activity within patient's tolerance    Home Living                      Prior Function  PT Goals (current goals can now be found in the care plan section)      Frequency    Min 3X/week      PT Plan Current plan remains appropriate    Co-evaluation              AM-PAC PT "6 Clicks" Mobility   Outcome Measure  Help needed turning from your back to your side while in a flat bed without using bedrails?: A Lot Help needed moving from lying on your back to sitting on the side of a flat bed without using bedrails?: A Lot Help needed moving to and from a bed to a chair (including a wheelchair)?: A Lot Help needed standing up from a chair using your arms (e.g., wheelchair or bedside chair)?: A Lot Help needed to walk in hospital room?: Total Help needed climbing 3-5 steps with a railing? : Total 6 Click Score:  10    End of Session Equipment Utilized During Treatment: Gait belt Activity Tolerance: Patient limited by fatigue;Patient limited by pain Patient left: in bed;with call bell/phone within reach;with bed alarm set(Pt in chair 1150-->1225, returned to bed per pt. request) Nurse Communication: Mobility status;Other (comment) PT Visit Diagnosis: Muscle weakness (generalized) (M62.81);Difficulty in walking, not elsewhere classified (R26.2)     Time: [phone] then [phone] PT Time Calculation (min) (ACUTE ONLY): 85 min  Charges:  $Therapeutic Exercise: 38-52 mins                     [address] LPTA; CBIS [phone]  Aldona Lento 04/19/2019, 1:09 PM

## 2019-04-19 NOTE — Plan of Care (Signed)
  Problem: Health Behavior/Discharge Planning: Goal: Ability to manage health-related needs will improve Outcome: Progressing   Problem: Clinical Measurements: Goal: Ability to maintain clinical measurements within normal limits will improve Outcome: Progressing Goal: Will remain free from infection Outcome: Progressing Goal: Diagnostic test results will improve Outcome: Progressing Goal: Respiratory complications will improve Outcome: Progressing Goal: Cardiovascular complication will be avoided Outcome: Progressing   Problem: Activity: Goal: Risk for activity intolerance will decrease Outcome: Progressing   Problem: Nutrition: Goal: Adequate nutrition will be maintained Outcome: Progressing   Problem: Coping: Goal: Level of anxiety will decrease Outcome: Progressing   Problem: Clinical Measurements: Goal: Ability to maintain clinical measurements within normal limits will improve Outcome: Progressing Goal: Postoperative complications will be avoided or minimized Outcome: Progressing

## 2019-04-19 NOTE — Progress Notes (Signed)
Inpatient Rehabilitation Admissions Coordinator  Inpatient rehab consult received. Await MRI results and then I will follow up with patient to complete rehab assessment.  Danne Baxter, RN, MSN Rehab Admissions Coordinator [phone] 04/19/2019 4:36 PM

## 2019-04-20 DIAGNOSIS — R102 Pelvic and perineal pain: Secondary | ICD-10-CM

## 2019-04-20 LAB — CULTURE, BLOOD (ROUTINE X 2)
Culture: NO GROWTH
Culture: NO GROWTH
Special Requests: ADEQUATE
Special Requests: ADEQUATE

## 2019-04-20 LAB — GLUCOSE, CAPILLARY
Glucose-Capillary: 134 mg/dL — ABNORMAL HIGH (ref 70–99)
Glucose-Capillary: 127 mg/dL — ABNORMAL HIGH (ref 70–99)
Glucose-Capillary: 131 mg/dL — ABNORMAL HIGH (ref 70–99)
Glucose-Capillary: 126 mg/dL — ABNORMAL HIGH (ref 70–99)

## 2019-04-20 MED ORDER — POLYETHYLENE GLYCOL 3350 17 G PO PACK
17.0000 g | PACK | Freq: Every day | ORAL | Status: DC
  Administered 2019-04-20 – 2019-04-21 (×2): 17 g via ORAL
  Filled 2019-04-20 (×2): qty 1

## 2019-04-20 NOTE — Progress Notes (Signed)
PROGRESS NOTE    Erika Fry  MIW:[id number] DOB: 01/29/1946 DOA: 04/09/2019 PCP: [name], MD    Brief Narrative:   73 year old female with history of chronic interstitial cystitis, chronic pelvic pain, history of lumbar discitis treated with prolonged antibiotics, chronic kidney disease due to hypertension , stage IV with baseline creatinine about 3-3.6, type 2 diabetes on insulin, complete heart block status post dual-chamber pacemaker, diastolic heart failure , hypothyroidism and anemia who was brought to interventional radiology for right nephrostomy tube placement and found to have acute kidney injury as well as severe persistent pain so she was admitted to the hospital.   Patient remains in the hospital and continues to have pelvic pain waiting for rehab. Pain control remains an issue and followed by palliative care.  Rehab auth pending.  Assessment & Plan:   Principal Problem:   Hydronephrosis Active Problems:   Mobitz type 2 second degree AV block   CKD (chronic kidney disease) stage 4, GFR 15-29 ml/min (HCC)   Diet-controlled diabetes mellitus (HCC)   Essential hypertension   Interstitial cystitis   AKI (acute kidney injury) ([place])   Acquired hydronephrosis with ureteropelvic junction (UPJ) obstruction   Pain in joint involving pelvic region and thigh   Dysuria   Palliative care by specialist  Acute kidney injury on stage IV chronic kidney disease complicated with hyponatremia, hyperkalemia and metabolic acidosis:  Treated with bicarbonate infusion. Now off IV fluids. Renal functions appropriately improving. Her nephrostomy tube is draining. renal functions fluctuating however no worsening.  She will have outpatient follow-up by nephrology.  Febrile episode: Improved now.  No focus of infection found.  Was treated with antibiotics and now discontinued.  Intractable back pain, pelvic pain with interstitial cystitis: Treated with multiple agents including  oxycodone, oxybutynin and darifenacin.  Pain control issues remain.  Appreciate palliative care input. On increasing dose of oral oxycodone and on fentanyl. Patient does have history of lumbar discitis and significant pelvic pain. MRI was done that showed mostly chronic changes.  No acute infection or cord compression.   She will need some pain regimen to work with therapies.    Right-sided hydronephrosis with UPJ obstruction: Obstruction due to?  Interstitial cystitis and scarring.  Nephrostomy tube draining well. Ambulate.  Foley catheter was placed suspecting of more obstruction.  Removed and urinating well.  Mobitz type II second-degree AV block status post pacemaker: Is stable.  Diet controlled type 2 diabetes: On sliding scale in the hospital.  Hypothyroidism: On Synthroid.  Euthyroid.  Advance activities.  Continue to work with PT OT. Transfer to acute rehab when available.  DVT prophylaxis: Heparin subcu Code Status: Full code Family Communication: Patient to communicate with family Disposition Plan:  Acute rehab admission referral done.   Consultants:   Urology, signed off for outpatient follow-up  Nephrology, signed off.  Palliative care  Procedures:   Right nephrostomy by IR  Antimicrobials: Meropenem, 04/15/2019--- 5/16/ 2020   Subjective: Patient seen and examined.  No overnight events.  Continues to have some pelvic pain and suprapubic pain.  Afebrile.  Urinating well. Objective: Vitals:   04/19/19 1306 04/19/19 1726 04/19/19 2059 04/20/19 0429  BP:  113/72 (!) 115/59 106/69  Pulse: 74 76 78 74  Resp:  19 18 18   Temp:  98.7 F (37.1 C) 100 F (37.8 C) 98.1 F (36.7 C)  TempSrc:  Oral Oral Oral  SpO2: 98% 94% 95% 96%  Weight:      Height:  Intake/Output Summary (Last 24 hours) at 04/20/2019 1328 Last data filed at 04/20/2019 1138 Gross per 24 hour  Intake 720 ml  Output 775 ml  Net -55 ml   Filed Weights   04/09/19 1609  Weight: 67.3  kg    Examination:  Physical Exam  Constitutional: She is oriented to person, place, and time. She appears well-developed.  Chronically sick looking but currently comfortable.  HENT:  Head: Normocephalic and atraumatic.  Mouth/Throat: Oropharynx is clear and moist.  Eyes: Pupils are equal, round, and reactive to light. EOM are normal. Left eye exhibits no discharge.  Neck: Normal range of motion. Neck supple.  Cardiovascular: Normal rate, regular rhythm and normal heart sounds.  Respiratory: Effort normal. She has no wheezes.  GI: Soft. Bowel sounds are normal. She exhibits no distension. There is no abdominal tenderness.  No localized tenderness or guarding.  Musculoskeletal: Normal range of motion.     Comments: Patient has chronic non-pedal edema and hyperesthesia.  Lymphadenopathy:    She has no cervical adenopathy.  Neurological: She is alert and oriented to person, place, and time.  Skin: Skin is warm and dry.  Psychiatric: She has a normal mood and affect. Her behavior is normal.   Right-sided nephrostomy tube draining freely.  Pacemaker left precordium nontender.  Data Reviewed: I have personally reviewed following labs and imaging studies  CBC: Recent Labs  Lab 04/14/19 0550 04/15/19 0534  WBC 6.1 6.8  NEUTROABS 4.7 4.8  HGB 7.7* 8.2*  HCT 23.9* 25.0*  MCV 88.2 89.0  PLT 73* 81*   Basic Metabolic Panel: Recent Labs  Lab 04/15/19 0534 04/16/19 0532 04/17/19 0733 04/18/19 0502 04/19/19 0431  NA 130* 131* 133* 133* 131*  K 3.3* 3.1* 4.4 4.5 4.1  CL 89* 93* 96* 95* 97*  CO2 30 28 25 25 24   GLUCOSE 112* 98 115* 100* 94  BUN 91* 83* 72* 71* 74*  CREATININE 4.46* 4.25* 4.10* 4.36* 4.20*  CALCIUM 7.7* 7.7* 8.4* 8.4* 8.7*  MG 1.5*  --   --   --   --   PHOS 2.9 3.1 2.5 2.6 3.1   GFR: Estimated Creatinine Clearance: 10.9 mL/min (A) (by C-G formula based on SCr of 4.2 mg/dL (H)). Liver Function Tests: Recent Labs  Lab 04/16/19 0532 04/17/19 0733  04/18/19 0502 04/19/19 0431  ALBUMIN 1.9* 2.0* 2.1* 2.1*   No results for input(s): LIPASE, AMYLASE in the last 168 hours. No results for input(s): AMMONIA in the last 168 hours. Coagulation Profile: No results for input(s): INR, PROTIME in the last 168 hours. Cardiac Enzymes: No results for input(s): CKTOTAL, CKMB, CKMBINDEX, TROPONINI in the last 168 hours. BNP (last 3 results) No results for input(s): PROBNP in the last 8760 hours. HbA1C: No results for input(s): HGBA1C in the last 72 hours. CBG: Recent Labs  Lab 04/19/19 1125 04/19/19 1738 04/19/19 2053 04/20/19 0711 04/20/19 1141  GLUCAP 107* 99 137* 134* 127*   Lipid Profile: No results for input(s): CHOL, HDL, LDLCALC, TRIG, CHOLHDL, LDLDIRECT in the last 72 hours. Thyroid Function Tests: No results for input(s): TSH, T4TOTAL, FREET4, T3FREE, THYROIDAB in the last 72 hours. Anemia Panel: No results for input(s): VITAMINB12, FOLATE, FERRITIN, TIBC, IRON, RETICCTPCT in the last 72 hours. Sepsis Labs: Recent Labs  Lab 04/15/19 1201 04/15/19 1212  PROCALCITON 5.46  --   LATICACIDVEN  --  0.9    Recent Results (from the past 240 hour(s))  Culture, Urine     Status: None  Collection Time: 04/13/19  1:46 PM  Result Value Ref Range Status   Specimen Description   Final    URINE, CATHETERIZED Performed at [facility] [address], [place], [place] [address]    Special Requests   Final    Normal Performed at [facility], [place] [address], [place], [place] [address]    Culture   Final    Multiple bacterial morphotypes present, none predominant. Suggest appropriate recollection if clinically indicated.   Report Status 04/15/2019 FINAL  Final  Culture, blood (routine x 2)     Status: None   Collection Time: 04/15/19  9:55 AM  Result Value Ref Range Status   Specimen Description   Final    BLOOD RIGHT ARM Performed at [facility] [address]  [place], [place] [address]    Special Requests   Final    BOTTLES DRAWN AEROBIC AND ANAEROBIC Blood Culture adequate volume Performed at [facility] [address], [place], [place] [address]    Culture   Final    NO GROWTH 5 DAYS Performed at [facility], [place] [address] [place], [place] [address]    Report Status 04/20/2019 FINAL  Final  Culture, blood (routine x 2)     Status: None   Collection Time: 04/15/19  9:56 AM  Result Value Ref Range Status   Specimen Description   Final    BLOOD LEFT HAND Performed at [facility] [address] [place], [place] [address]    Special Requests   Final    BOTTLES DRAWN AEROBIC AND ANAEROBIC Blood Culture adequate volume Performed at [facility] [place] [address] [place], [place] [address]    Culture   Final    NO GROWTH 5 DAYS Performed at [facility], [place] [address], [place], [place] [address]    Report Status 04/20/2019 FINAL  Final         Radiology Studies: Mr Lumbar Spine Wo Contrast  Result Date: 04/19/2019 CLINICAL DATA:  Chronic kidney disease, with severe back pain. Prior diskitis. EXAM: MRI LUMBAR SPINE WITHOUT CONTRAST TECHNIQUE: Multiplanar, multisequence MR imaging of the lumbar spine was performed. No intravenous contrast was administered. COMPARISON:  01/22/2019 MRI lumbar spine. FINDINGS: Segmentation:  Standard. Alignment:  Anatomic. Vertebrae: Continued satisfactory appearance following discitis L4-5 and osteomyelitis at the L4 and L5 levels. T1 and T2 hyperintensity of the vertebral body marrow, consistent with the appearance related to chronic disc space narrowing, and resolution of the prior inflammatory process. No worrisome marrow changes elsewhere, including the visualized sacrum. Conus medullaris and cauda equina: Conus extends to the L1 level. Conus and cauda equina appear normal. Paraspinal and other soft tissues: Improved RIGHT hydronephrosis.  No paravertebral abscess or mass. Disc levels: L1-L2: Annular bulge. Facet arthropathy. RIGHT foraminal protrusion could affect the L1 nerve root. L2-L3: Annular bulge. Facet arthropathy. Mild LEFT foraminal stenosis. L3-L4: Annular bulge. Facet arthropathy. Borderline subarticular zone and foraminal zone narrowing. L4-L5: Near complete to complete loss of interspace height. Posterior element hypertrophy. No concerning features of discitis or epidural abscess. Asymmetric foraminal zone narrowing to the RIGHT, could affect the L4 nerve root although stable from priors. L5-S1:  No significant findings. IMPRESSION: Changes related to chronic L4-5 diskitis and regional osteomyelitis, with disc space narrowing and Modic type 2 endplate changes. No evidence of vertebral body compression fracture or significant subluxation. No imaging features to suggest a persistent or new  spinal infection. Stable multilevel spondylosis, without severe spinal stenosis at any level. Improved RIGHT hydronephrosis. Visualized sacrum and regional pelvis grossly unremarkable. Electronically Signed   By: Staci Righter M.D.   On: 04/19/2019 17:02        Scheduled Meds: . darifenacin  7.5 mg Oral Daily  . feeding supplement (ENSURE ENLIVE)  237 mL Oral BID BM  . ferrous sulfate  325 mg Oral BID WC  . folic acid  1 mg Oral Daily  . heparin  5,000 Units Subcutaneous Q8H  . insulin aspart  0-9 Units Subcutaneous TID WC  . levothyroxine  75 mcg Oral QAC breakfast  . multivitamin with minerals  1 tablet Oral Daily  . pantoprazole  40 mg Oral BID  . polyethylene glycol  17 g Oral Daily  . simvastatin  40 mg Oral QHS  . vitamin B-12  1,000 mcg Oral Daily   Continuous Infusions:    LOS: 10 days    Time spent: 25 minutes    Barb Merino, MD [facility] Pager [phone]  If 7PM-7AM, please contact night-coverage [email or web address] Password [email or web address] 04/20/2019, 1:28 PM

## 2019-04-20 NOTE — Progress Notes (Addendum)
Inpatient Rehabilitation Admissions Coordinator  Inpatient rehab consult received. I reviewed pt's chart and contacted her by phone for a rehab assessment. Pt was previously at [facility] 05/2018 and discharged home at min assist level with family support at d/c . She prefers an inpt rehab admit rather than SNF. She asked me to contact her daughter in Estate manager/land agent and son, Gerald Stabs to discuss. I will make contact and begin insurance authorization for possible admit pending family discussion and insurance approval.  Danne Baxter, RN, MSN Rehab Admissions Coordinator [phone] 04/20/2019 12:45 PM  I contacted pt's son, Jenny Reichmann, for no return call by Gerald Stabs. John and I discussed a possible inpt rehab admission and he prefers inpt rehab and refuses SNF. I have insurance approval and am hopeful for a bed in the next 24 to 48 hrs. I did contact Dr. Sloan Leiter by phone for son had numerous concerns about MRI without contrast and wanted to discuss results. Dr. Quintella Baton he will contact son. I will follow up tomorrow.  Danne Baxter, RN, MSN Rehab Admissions Coordinator [phone] 04/20/2019 8:01 PM

## 2019-04-20 NOTE — Progress Notes (Signed)
Daily Progress Note   Patient Name: Erika Fry       Date: 04/20/2019 DOB: 17-May-1946  Age: 73 y.o. MRN#: [id number] Attending Physician: Barb Merino, MD Primary Care Physician: [name], MD Admit Date: 04/09/2019  Reason for Consultation/Follow-up: Establishing goals of care and Pain control  Subjective: I saw and examined Erika Fry today.  She reports pain is improved today and she is feeling better overall.  States that she is hopeful to begin rehab at inpatient rehab soon.  She has been to CIR in the past and found it to be very beneficial.  Reports that she found out she is receiving gabapentin and that she does not like this medication and wants it to be stopped immediately.  States she had taken it as an outpatient and it made her feel "anxious, jittery, and just terrible."  She weaned herself off medication and threw the rest of it away.  I discussed potential benefit and my concern with stopping rather than tapering medication if she wanted to discontinue it.  She was adamant that she wanted it stopped regardless of concern for feeling worse by acute stopping.  Length of Stay: 10  Current Medications: Scheduled Meds:  . darifenacin  7.5 mg Oral Daily  . feeding supplement (ENSURE ENLIVE)  237 mL Oral BID BM  . ferrous sulfate  325 mg Oral BID WC  . folic acid  1 mg Oral Daily  . heparin  5,000 Units Subcutaneous Q8H  . insulin aspart  0-9 Units Subcutaneous TID WC  . levothyroxine  75 mcg Oral QAC breakfast  . multivitamin with minerals  1 tablet Oral Daily  . pantoprazole  40 mg Oral BID  . polyethylene glycol  17 g Oral Daily  . simvastatin  40 mg Oral QHS  . vitamin B-12  1,000 mcg Oral Daily    Continuous Infusions:   PRN Meds: acetaminophen **OR**  acetaminophen, fentaNYL (SUBLIMAZE) injection, Muscle Rub, ondansetron **OR** ondansetron (ZOFRAN) IV, oxyCODONE, phenol  Physical Exam         General: Alert, awake, in no acute distress.  HEENT: No bruits, no goiter, no JVD Heart: Regular rate and rhythm. No murmur appreciated. Lungs: Good air movement, clear Abdomen: Soft, nontender, nondistended, positive bowel sounds.  Ext: No significant edema Skin: Warm and dry Neuro: Grossly  intact, nonfocal.  Vital Signs: BP 106/69 (BP Location: Right Arm)   Pulse 74   Temp 98.1 F (36.7 C) (Oral)   Resp 18   Ht 5\' 4"  (1.626 m)   Wt 67.3 kg   SpO2 96%   BMI 25.47 kg/m  SpO2: SpO2: 96 % O2 Device: O2 Device: Room Air O2 Flow Rate: O2 Flow Rate (L/min): 2 L/min  Intake/output summary:   Intake/Output Summary (Last 24 hours) at 04/20/2019 1310 Last data filed at 04/20/2019 1138 Gross per 24 hour  Intake 720 ml  Output 1350 ml  Net -630 ml   LBM: Last BM Date: 04/20/19 Baseline Weight: Weight: 67.3 kg Most recent weight: Weight: 67.3 kg       Palliative Assessment/Data:    Flowsheet Rows     Most Recent Value  Intake Tab  Referral Department  Hospitalist  Unit at Time of Referral  Med/Surg Unit  Palliative Care Primary Diagnosis  Nephrology  Date Notified  04/17/19  Palliative Care Type  New Palliative care  Reason for referral  Pain  Date of Admission  04/09/19  Date first seen by Palliative Care  04/17/19  # of days Palliative referral response time  0 Day(s)  # of days IP prior to Palliative referral  8  Clinical Assessment  Palliative Performance Scale Score  40%  Pain Max last 24 hours  4  Pain Min Last 24 hours  3  Dyspnea Max Last 24 Hours  4  Dyspnea Min Last 24 hours  3  Nausea Max Last 24 Hours  4  Nausea Min Last 24 Hours  3  Psychosocial & Spiritual Assessment  Palliative Care Outcomes  Patient/Family meeting held?  Yes  Who was at the meeting?  discussed with daughter in law over the phone.        Patient Active Problem List   Diagnosis Date Noted  . Acquired hydronephrosis with ureteropelvic junction (UPJ) obstruction   . Pain in joint involving pelvic region and thigh   . Dysuria   . Palliative care by specialist   . AKI (acute kidney injury) ([place]) 04/10/2019  . Hydronephrosis 04/09/2019  . Interstitial cystitis 04/09/2019  . Bilateral leg edema 02/25/2019  . Chronic heart failure with preserved ejection fraction ([place]) 02/24/2019  . Cardiac pacemaker in situ 02/24/2019  . ARF (acute renal failure) ([place]) 02/10/2019  . Peripheral edema   . Fever 01/17/2019  . Acute lower UTI 01/17/2019  . Pressure injury of skin 01/17/2019  . Gross hematuria 01/14/2019  . Fluid retention   . Generalized edema   . Hypokalemia   . Acute right-sided low back pain with right-sided sciatica   . Acute blood loss anemia   . Anemia of chronic disease   . Thrombocytopenia ([place])   . Diabetes mellitus ([place])   . Morbid obesity ([place])   . Benign essential HTN   . Hypoalbuminemia due to protein-calorie malnutrition ([place])   . Occult blood in stools   . Gastric polyp   . Lumbar discitis 05/17/2018  . Blood loss anemia 05/12/2018  . Hyperlipidemia associated with type 2 diabetes mellitus ([place]) 05/10/2018  . Essential hypertension 04/29/2018  . CKD (chronic kidney disease) stage 4, GFR 15-29 ml/min (HCC)   . Diet-controlled diabetes mellitus ([place])   . GERD (gastroesophageal reflux disease)   . Gout   . High cholesterol   . Hypothyroidism   . Iron deficiency anemia   . AV block, Mobitz 2 03/25/2018  . Mobitz type  2 second degree AV block 03/20/2018  . Aortic stenosis 03/20/2018  . Exertional dyspnea 03/19/2018    Palliative Care Assessment & Plan   Patient Profile: 73 year old female with chronic pelvic pain, interstitial nephritis, CKD, DM, heart block admitted with intractible pain, AKI, hydronephrosis  Assessment: Patient Active Problem List   Diagnosis Date Noted  . Acquired  hydronephrosis with ureteropelvic junction (UPJ) obstruction   . Pain in joint involving pelvic region and thigh   . Dysuria   . Palliative care by specialist   . AKI (acute kidney injury) ([place]) 04/10/2019  . Hydronephrosis 04/09/2019  . Interstitial cystitis 04/09/2019  . Bilateral leg edema 02/25/2019  . Chronic heart failure with preserved ejection fraction ([place]) 02/24/2019  . Cardiac pacemaker in situ 02/24/2019  . ARF (acute renal failure) ([place]) 02/10/2019  . Peripheral edema   . Fever 01/17/2019  . Acute lower UTI 01/17/2019  . Pressure injury of skin 01/17/2019  . Gross hematuria 01/14/2019  . Fluid retention   . Generalized edema   . Hypokalemia   . Acute right-sided low back pain with right-sided sciatica   . Acute blood loss anemia   . Anemia of chronic disease   . Thrombocytopenia ([place])   . Diabetes mellitus ([place])   . Morbid obesity ([place])   . Benign essential HTN   . Hypoalbuminemia due to protein-calorie malnutrition ([place])   . Occult blood in stools   . Gastric polyp   . Lumbar discitis 05/17/2018  . Blood loss anemia 05/12/2018  . Hyperlipidemia associated with type 2 diabetes mellitus ([place]) 05/10/2018  . Essential hypertension 04/29/2018  . CKD (chronic kidney disease) stage 4, GFR 15-29 ml/min (HCC)   . Diet-controlled diabetes mellitus ([place])   . GERD (gastroesophageal reflux disease)   . Gout   . High cholesterol   . Hypothyroidism   . Iron deficiency anemia   . AV block, Mobitz 2 03/25/2018  . Mobitz type 2 second degree AV block 03/20/2018  . Aortic stenosis 03/20/2018  . Exertional dyspnea 03/19/2018    Recommendations/Plan: Pain: Patient desires to d/c gabapentin.  Discussed gradual taper, but she is clear that she does not want any additional doses despite concern for withdrawal symptoms.  Discussed with pharmacy to inform patient refusal to consider taper to stop.  Will monitor closely.  Otherwise continue same regimen.  ? Consideration for TCA if  chronic pelvic pain.  Results of MRI reviewed and I discussed these with daughter in law over phone.   Patient and family hopeful for transition to CIR.  Eval pending.  Goals of Care and Additional Recommendations: Limitations on Scope of Treatment: Full Scope Treatment  Code Status:    Code Status Orders  (From admission, onward)         Start     Ordered   04/09/19 1534  Full code  Continuous     04/09/19 1534        Code Status History    Date Active Date Inactive Code Status Order ID Comments User Context   02/10/2019 2234 02/19/2019 1641 Full Code [id number]  Rise Patience, MD ED   01/17/2019 0054 01/29/2019 1900 Full Code [id number]  Phillips Grout, MD ED   05/22/2018 1524 06/22/2018 1726 Full Code [id number]  Bary Leriche, PA-C Inpatient   05/12/2018 2314 05/22/2018 1458 Full Code [id number]  Bennie Pierini, MD ED   04/29/2018 1931 05/05/2018 1839 Full Code [id number]  Ivor Costa, MD ED   03/25/2018  1828 03/26/2018 1447 Full Code [id number]  Evans Lance, MD Inpatient      Prognosis:  > 12 months  Discharge Planning: To Be Determined  Care plan was discussed with patient, DIL  Thank you for allowing the Palliative Medicine Team to assist in the care of this patient.   Time In: 1100 Time Out: 1145 Total Time 45 Prolonged Time Billed No      Greater than 50%  of this time was spent counseling and coordinating care related to the above assessment and plan.  Micheline Rough, MD  Please contact Palliative Medicine Team phone at [phone] for questions and concerns.

## 2019-04-21 ENCOUNTER — Encounter (HOSPITAL_COMMUNITY): Payer: Self-pay | Admitting: *Deleted

## 2019-04-21 ENCOUNTER — Inpatient Hospital Stay (HOSPITAL_COMMUNITY)
Admission: RE | Admit: 2019-04-21 | Discharge: 2019-05-01 | DRG: 945 | Disposition: A | Discharge status: Home Health | Payer: Medicare Other | Source: Other Acute Inpatient Hospital | Attending: Physical Medicine & Rehabilitation | Admitting: Physical Medicine & Rehabilitation

## 2019-04-21 ENCOUNTER — Other Ambulatory Visit: Payer: Self-pay

## 2019-04-21 DIAGNOSIS — D649 Anemia, unspecified: Secondary | ICD-10-CM | POA: Diagnosis not present

## 2019-04-21 DIAGNOSIS — E1122 Type 2 diabetes mellitus with diabetic chronic kidney disease: Secondary | ICD-10-CM | POA: Diagnosis present

## 2019-04-21 DIAGNOSIS — M109 Gout, unspecified: Secondary | ICD-10-CM | POA: Diagnosis present

## 2019-04-21 DIAGNOSIS — K5903 Drug induced constipation: Secondary | ICD-10-CM | POA: Diagnosis not present

## 2019-04-21 DIAGNOSIS — N179 Acute kidney failure, unspecified: Secondary | ICD-10-CM | POA: Diagnosis present

## 2019-04-21 DIAGNOSIS — N189 Chronic kidney disease, unspecified: Secondary | ICD-10-CM

## 2019-04-21 DIAGNOSIS — R3989 Other symptoms and signs involving the genitourinary system: Secondary | ICD-10-CM

## 2019-04-21 DIAGNOSIS — N185 Chronic kidney disease, stage 5: Secondary | ICD-10-CM | POA: Diagnosis not present

## 2019-04-21 DIAGNOSIS — R5381 Other malaise: Secondary | ICD-10-CM | POA: Diagnosis present

## 2019-04-21 DIAGNOSIS — Z833 Family history of diabetes mellitus: Secondary | ICD-10-CM | POA: Diagnosis not present

## 2019-04-21 DIAGNOSIS — Z79891 Long term (current) use of opiate analgesic: Secondary | ICD-10-CM | POA: Diagnosis not present

## 2019-04-21 DIAGNOSIS — Z7189 Other specified counseling: Secondary | ICD-10-CM

## 2019-04-21 DIAGNOSIS — D62 Acute posthemorrhagic anemia: Secondary | ICD-10-CM | POA: Diagnosis present

## 2019-04-21 DIAGNOSIS — Z7951 Long term (current) use of inhaled steroids: Secondary | ICD-10-CM | POA: Diagnosis not present

## 2019-04-21 DIAGNOSIS — Z79890 Hormone replacement therapy (postmenopausal): Secondary | ICD-10-CM

## 2019-04-21 DIAGNOSIS — E669 Obesity, unspecified: Secondary | ICD-10-CM | POA: Diagnosis present

## 2019-04-21 DIAGNOSIS — N1330 Unspecified hydronephrosis: Secondary | ICD-10-CM | POA: Diagnosis present

## 2019-04-21 DIAGNOSIS — E871 Hypo-osmolality and hyponatremia: Secondary | ICD-10-CM | POA: Diagnosis present

## 2019-04-21 DIAGNOSIS — I441 Atrioventricular block, second degree: Secondary | ICD-10-CM | POA: Diagnosis not present

## 2019-04-21 DIAGNOSIS — N184 Chronic kidney disease, stage 4 (severe): Secondary | ICD-10-CM | POA: Diagnosis present

## 2019-04-21 DIAGNOSIS — Z7984 Long term (current) use of oral hypoglycemic drugs: Secondary | ICD-10-CM

## 2019-04-21 DIAGNOSIS — K219 Gastro-esophageal reflux disease without esophagitis: Secondary | ICD-10-CM | POA: Diagnosis present

## 2019-04-21 DIAGNOSIS — N3289 Other specified disorders of bladder: Secondary | ICD-10-CM | POA: Diagnosis present

## 2019-04-21 DIAGNOSIS — E1169 Type 2 diabetes mellitus with other specified complication: Secondary | ICD-10-CM | POA: Diagnosis not present

## 2019-04-21 DIAGNOSIS — N3010 Interstitial cystitis (chronic) without hematuria: Secondary | ICD-10-CM | POA: Diagnosis present

## 2019-04-21 DIAGNOSIS — T40605A Adverse effect of unspecified narcotics, initial encounter: Secondary | ICD-10-CM | POA: Diagnosis present

## 2019-04-21 DIAGNOSIS — E7800 Pure hypercholesterolemia, unspecified: Secondary | ICD-10-CM | POA: Diagnosis present

## 2019-04-21 DIAGNOSIS — R0989 Other specified symptoms and signs involving the circulatory and respiratory systems: Secondary | ICD-10-CM

## 2019-04-21 DIAGNOSIS — K5900 Constipation, unspecified: Secondary | ICD-10-CM | POA: Diagnosis present

## 2019-04-21 DIAGNOSIS — N1339 Other hydronephrosis: Secondary | ICD-10-CM | POA: Diagnosis not present

## 2019-04-21 DIAGNOSIS — Z79899 Other long term (current) drug therapy: Secondary | ICD-10-CM

## 2019-04-21 DIAGNOSIS — N3941 Urge incontinence: Secondary | ICD-10-CM | POA: Diagnosis present

## 2019-04-21 DIAGNOSIS — R531 Weakness: Secondary | ICD-10-CM | POA: Diagnosis present

## 2019-04-21 DIAGNOSIS — I9589 Other hypotension: Secondary | ICD-10-CM | POA: Diagnosis not present

## 2019-04-21 DIAGNOSIS — Z950 Presence of cardiac pacemaker: Secondary | ICD-10-CM | POA: Diagnosis not present

## 2019-04-21 DIAGNOSIS — E039 Hypothyroidism, unspecified: Secondary | ICD-10-CM | POA: Diagnosis present

## 2019-04-21 DIAGNOSIS — I959 Hypotension, unspecified: Secondary | ICD-10-CM

## 2019-04-21 DIAGNOSIS — D696 Thrombocytopenia, unspecified: Secondary | ICD-10-CM | POA: Diagnosis present

## 2019-04-21 DIAGNOSIS — N319 Neuromuscular dysfunction of bladder, unspecified: Secondary | ICD-10-CM | POA: Diagnosis present

## 2019-04-21 DIAGNOSIS — E8809 Other disorders of plasma-protein metabolism, not elsewhere classified: Secondary | ICD-10-CM | POA: Diagnosis present

## 2019-04-21 DIAGNOSIS — D638 Anemia in other chronic diseases classified elsewhere: Secondary | ICD-10-CM | POA: Diagnosis present

## 2019-04-21 DIAGNOSIS — E872 Acidosis: Secondary | ICD-10-CM | POA: Diagnosis present

## 2019-04-21 DIAGNOSIS — Z6827 Body mass index (BMI) 27.0-27.9, adult: Secondary | ICD-10-CM

## 2019-04-21 DIAGNOSIS — I129 Hypertensive chronic kidney disease with stage 1 through stage 4 chronic kidney disease, or unspecified chronic kidney disease: Secondary | ICD-10-CM | POA: Diagnosis present

## 2019-04-21 DIAGNOSIS — Z45018 Encounter for adjustment and management of other part of cardiac pacemaker: Secondary | ICD-10-CM | POA: Diagnosis not present

## 2019-04-21 LAB — RENAL FUNCTION PANEL
Albumin: 2 g/dL — ABNORMAL LOW (ref 3.5–5.0)
Anion gap: 11 (ref 5–15)
BUN: 70 mg/dL — ABNORMAL HIGH (ref 8–23)
CO2: 24 mmol/L (ref 22–32)
Calcium: 8.9 mg/dL (ref 8.9–10.3)
Chloride: 97 mmol/L — ABNORMAL LOW (ref 98–111)
Creatinine, Ser: 4.63 mg/dL — ABNORMAL HIGH (ref 0.44–1.00)
GFR calc Af Amer: 10 mL/min — ABNORMAL LOW (ref >60)
GFR calc non Af Amer: 9 mL/min — ABNORMAL LOW (ref >60)
Glucose, Bld: 98 mg/dL (ref 70–99)
Phosphorus: 3.7 mg/dL (ref 2.5–4.6)
Potassium: 5 mmol/L (ref 3.5–5.1)
Sodium: 132 mmol/L — ABNORMAL LOW (ref 135–145)

## 2019-04-21 LAB — GLUCOSE, CAPILLARY
Glucose-Capillary: 95 mg/dL (ref 70–99)
Glucose-Capillary: 120 mg/dL — ABNORMAL HIGH (ref 70–99)
Glucose-Capillary: 89 mg/dL (ref 70–99)
Glucose-Capillary: 132 mg/dL — ABNORMAL HIGH (ref 70–99)

## 2019-04-21 MED ORDER — VITAMIN B-12 1000 MCG PO TABS
1000.0000 ug | ORAL_TABLET | Freq: Every day | ORAL | Status: DC
  Administered 2019-04-22 – 2019-05-01 (×10): 1000 ug via ORAL
  Filled 2019-04-21 (×10): qty 1

## 2019-04-21 MED ORDER — TRAZODONE HCL 50 MG PO TABS
25.0000 mg | ORAL_TABLET | Freq: Every evening | ORAL | Status: DC | PRN
  Administered 2019-04-27 – 2019-04-29 (×3): 50 mg via ORAL
  Filled 2019-04-21 (×3): qty 1

## 2019-04-21 MED ORDER — DIPHENHYDRAMINE HCL 12.5 MG/5ML PO ELIX: 12.5000 mg | ORAL_SOLUTION | Freq: Four times a day (QID) | ORAL | Status: DC | PRN

## 2019-04-21 MED ORDER — INSULIN ASPART 100 UNIT/ML ~~LOC~~ SOLN
0.0000 [IU] | Freq: Three times a day (TID) | SUBCUTANEOUS | Status: DC
  Administered 2019-04-22: 1 [IU] via SUBCUTANEOUS
  Administered 2019-04-22: 13:00:00 2 [IU] via SUBCUTANEOUS
  Administered 2019-04-23 – 2019-04-26 (×4): 1 [IU] via SUBCUTANEOUS

## 2019-04-21 MED ORDER — ENSURE ENLIVE PO LIQD: 237.0000 mL | Freq: Two times a day (BID) | ORAL | Status: DC

## 2019-04-21 MED ORDER — BISACODYL 10 MG RE SUPP: 10.0000 mg | Freq: Every day | RECTAL | Status: DC | PRN

## 2019-04-21 MED ORDER — ONDANSETRON HCL 4 MG/2ML IJ SOLN: 4.0000 mg | Freq: Four times a day (QID) | INTRAMUSCULAR | Status: DC | PRN

## 2019-04-21 MED ORDER — FLEET ENEMA 7-19 GM/118ML RE ENEM: 1.0000 | ENEMA | Freq: Once | RECTAL | Status: DC

## 2019-04-21 MED ORDER — POLYETHYLENE GLYCOL 3350 17 G PO PACK
17.0000 g | PACK | Freq: Two times a day (BID) | ORAL | Status: DC
  Administered 2019-04-21 – 2019-04-30 (×16): 17 g via ORAL
  Filled 2019-04-21 (×17): qty 1

## 2019-04-21 MED ORDER — SIMVASTATIN 20 MG PO TABS
40.0000 mg | ORAL_TABLET | Freq: Every day | ORAL | Status: DC
  Administered 2019-04-21 – 2019-04-30 (×9): 40 mg via ORAL
  Filled 2019-04-21 (×9): qty 2

## 2019-04-21 MED ORDER — LEVOTHYROXINE SODIUM 75 MCG PO TABS
75.0000 ug | ORAL_TABLET | Freq: Every day | ORAL | Status: DC
  Administered 2019-04-22 – 2019-05-01 (×10): 75 ug via ORAL
  Filled 2019-04-21 (×10): qty 1

## 2019-04-21 MED ORDER — OXYCODONE HCL 5 MG PO TABS: 5.0000 mg | ORAL_TABLET | ORAL | 0 refills | Status: DC | PRN

## 2019-04-21 MED ORDER — PHENOL 1.4 % MT LIQD: 1.0000 | OROMUCOSAL | Status: DC | PRN

## 2019-04-21 MED ORDER — FERROUS SULFATE 325 (65 FE) MG PO TABS
325.0000 mg | ORAL_TABLET | Freq: Two times a day (BID) | ORAL | Status: DC
  Administered 2019-04-21 – 2019-05-01 (×20): 325 mg via ORAL
  Filled 2019-04-21 (×20): qty 1

## 2019-04-21 MED ORDER — GUAIFENESIN-DM 100-10 MG/5ML PO SYRP: 5.0000 mL | ORAL_SOLUTION | Freq: Four times a day (QID) | ORAL | Status: DC | PRN

## 2019-04-21 MED ORDER — ACETAMINOPHEN 325 MG PO TABS
325.0000 mg | ORAL_TABLET | ORAL | Status: DC | PRN
  Administered 2019-04-23 – 2019-04-28 (×11): 650 mg via ORAL
  Administered 2019-04-28: 06:00:00 325 mg via ORAL
  Administered 2019-04-29: 650 mg via ORAL
  Filled 2019-04-21 (×13): qty 2

## 2019-04-21 MED ORDER — DARIFENACIN HYDROBROMIDE ER 7.5 MG PO TB24
7.5000 mg | ORAL_TABLET | Freq: Every day | ORAL | Status: DC
  Administered 2019-04-22 – 2019-04-26 (×5): 7.5 mg via ORAL
  Filled 2019-04-21 (×5): qty 1

## 2019-04-21 MED ORDER — SIMVASTATIN 40 MG PO TABS: 40.0000 mg | ORAL_TABLET | Freq: Every day | ORAL | Status: AC

## 2019-04-21 MED ORDER — INSULIN ASPART 100 UNIT/ML ~~LOC~~ SOLN: 0.0000 [IU] | Freq: Every day | SUBCUTANEOUS | Status: DC

## 2019-04-21 MED ORDER — ONDANSETRON HCL 4 MG PO TABS: 4.0000 mg | ORAL_TABLET | Freq: Four times a day (QID) | ORAL | Status: DC | PRN

## 2019-04-21 MED ORDER — PRO-STAT SUGAR FREE PO LIQD
30.0000 mL | Freq: Two times a day (BID) | ORAL | Status: DC
  Administered 2019-04-21 – 2019-04-26 (×7): 30 mL via ORAL
  Filled 2019-04-21 (×13): qty 30

## 2019-04-21 MED ORDER — DOCUSATE SODIUM 100 MG PO CAPS
100.0000 mg | ORAL_CAPSULE | Freq: Two times a day (BID) | ORAL | Status: DC
  Administered 2019-04-21 – 2019-05-01 (×20): 100 mg via ORAL
  Filled 2019-04-21 (×20): qty 1

## 2019-04-21 MED ORDER — OXYCODONE HCL 5 MG PO TABS
5.0000 mg | ORAL_TABLET | ORAL | Status: DC | PRN
  Administered 2019-04-24 – 2019-05-01 (×31): 5 mg via ORAL
  Filled 2019-04-21 (×31): qty 1

## 2019-04-21 MED ORDER — MUSCLE RUB 10-15 % EX CREA
TOPICAL_CREAM | CUTANEOUS | Status: DC | PRN
  Filled 2019-04-21: qty 85

## 2019-04-21 MED ORDER — PANTOPRAZOLE SODIUM 40 MG PO TBEC
40.0000 mg | DELAYED_RELEASE_TABLET | Freq: Two times a day (BID) | ORAL | Status: DC
  Administered 2019-04-21 – 2019-05-01 (×20): 40 mg via ORAL
  Filled 2019-04-21 (×20): qty 1

## 2019-04-21 MED ORDER — FOLIC ACID 1 MG PO TABS
1.0000 mg | ORAL_TABLET | Freq: Every day | ORAL | Status: DC
  Administered 2019-04-22 – 2019-05-01 (×10): 1 mg via ORAL
  Filled 2019-04-21 (×10): qty 1

## 2019-04-21 MED ORDER — FLEET ENEMA 7-19 GM/118ML RE ENEM: 1.0000 | ENEMA | Freq: Once | RECTAL | Status: DC | PRN

## 2019-04-21 MED ORDER — ALUM & MAG HYDROXIDE-SIMETH 200-200-20 MG/5ML PO SUSP: 30.0000 mL | ORAL | Status: DC | PRN

## 2019-04-21 MED ORDER — MILK AND MOLASSES ENEMA
1.0000 | Freq: Once | RECTAL | Status: DC
  Filled 2019-04-21: qty 240

## 2019-04-21 MED ORDER — ADULT MULTIVITAMIN W/MINERALS CH
1.0000 | ORAL_TABLET | Freq: Every day | ORAL | Status: DC
  Administered 2019-04-22 – 2019-05-01 (×10): 1 via ORAL
  Filled 2019-04-21 (×10): qty 1

## 2019-04-21 MED ORDER — POLYETHYLENE GLYCOL 3350 17 G PO PACK: 17.0000 g | PACK | Freq: Every day | ORAL | Status: DC | PRN

## 2019-04-21 NOTE — PMR Pre-admission (Signed)
PMR Admission Coordinator Pre-Admission Assessment  Patient: Erika Fry is an 73 y.o., female MRN: [id number] DOB: 02/23/1946 Height: '5\' 4"'$  (162.6 cm) Weight: 67.3 kg  Insurance Information HMO: yes    PPO:      PCP:      IPA:      80/20:      OTHER: medicare advantage PRIMARY: [employer] Medicare      Policy#: [id number]      Subscriber: pt CM Name: Elisabeth Pigeon      Phone#: [phone]     Fax#: [phone] Pre-Cert#: [id number] for 3 days  With f/u [place] phone [phone] ext [phone] fax [phone] on 5/22     Employer: none Benefits:  Phone #: [phone]     Name: 5/19 portal Eff. Date: 12/02/2018     Deduct: none      Out of Pocket Max: $4500/met $6759.16      Life Max: none CIR: $325 co pay per day days 1 until 5      SNF: no co pay days 1 until 20; $160 co pay per day days 21 until 49; no co pay days 50 until 100 Outpatient: $35 co pay per visit     Co-Pay: visits per medical neccesity Home Health: 100%      Co-Pay: visits per medical neccesity DME: 80%     Co-Pay: 20% Providers: in network  SECONDARY: none        Medicaid Application Date:       Case Manager:  Disability Application Date:       Case Worker:   The "Data Collection Information Summary" for patients in Inpatient Rehabilitation Facilities with attached "Privacy Act [place] Records" was provided and verbally reviewed with: Patient  Emergency Contact Information Contact Information    Name Relation Home Work Mobile   Dinovo,Jennifer Relative [phone]  [phone]   Melika, Reder [phone]  [phone]   Morgin, Halls [phone]  [phone]   Lanessa, Shill [phone]  [phone]      Current Medical History  Patient Admitting Diagnosis: Debility  History of Present Illness: 73 year old female with history of chronic interstitial cystitis, chronic pelvic pain, history of lumbar discitis treated with prolonged anitibiotics, chronic kidney disease due  to HTN, stage IV with baseline creat about 3 to 3.6, Type 2 diabetes on insulin, complete heart block post dual chamber pacemaker, diastolic heart failure, hypothyroidism and anemia presented from interventional radiology on 04/09/2019.   Was at IR for right nephrostomy tube placement and found to have acute kidney injury as well as severe persistent pain so admitted to hospital. Obstruction due to interstitial cystitis and scarring. Nephrostomy tube draining well. AKI treated with bicarbonate infusion. Renal function fluctuating . Will have outpatient follow up with nephrology.  Febrile episode with no focus of infection found. Was treated with Meropenem 5/14 until 5/16 and now discontinued.  Intractable back pain treated with multiple agents including oxycodone, oxybutynin and darifenacin. Palliative care consulted to assist with pain management. MRI without contrast completed that showed mostly chronic changes. No acute infection or cord compression. Patient with history of lumbar discitis and significant pelvic pain. Patient was receiving gabapentin but patient had weaned herself off as an outpatient and requested med be discontinued.     Patient's [facility] has been reviewed by the rehabilitation admission coordinator and physician.  Past Medical History  Past Medical History:  Diagnosis Date  . Arthritis    "knees, thumbs" (  03/25/2018)  . CKD (chronic kidney disease), stage IV ([place])   . Diet-controlled diabetes mellitus ([place])   . GERD (gastroesophageal reflux disease)   . Gout    "on daily RX" (03/25/2018)  . Heart murmur   . High cholesterol   . Hypertension   . Hypothyroidism   . Iron deficiency anemia    "had to get an iron infusion"  . Migraine    "used to have them growing up" (03/25/2018)  . Presence of permanent cardiac pacemaker 03/25/2018    Family History   family history includes Diabetes Mellitus II in her brother and father; Gastric cancer  in her brother; Heart attack in her father; Heart disease in her father; Hypertension in her mother; Stomach cancer in her brother.  Prior Rehab/Hospitalizations Has the patient had prior rehab or hospitalizations prior to admission? Yes   05/2018 [facility] 05/2018 CIR Dr Posey Pronto 32 days and then d/c home with sons and daughter in [place] proving 24/7 assist and [facility]  Has the patient had major surgery during 100 days prior to admission? Yes   Current Medications  Current Facility-Administered Medications:  .  acetaminophen (TYLENOL) tablet 650 mg, 650 mg, Oral, Q6H PRN, 650 mg at 04/20/19 0757 **OR** acetaminophen (TYLENOL) suppository 650 mg, 650 mg, Rectal, Q6H PRN, Arrien, Jimmy Picket, MD, 650 mg at 04/15/19 0537 .  darifenacin (ENABLEX) 24 hr tablet 7.5 mg, 7.5 mg, Oral, Daily, Arrien, Jimmy Picket, MD, 7.5 mg at 04/20/19 0940 .  feeding supplement (ENSURE ENLIVE) (ENSURE ENLIVE) liquid 237 mL, 237 mL, Oral, BID BM, Barb Merino, MD, 237 mL at 04/20/19 0941 .  fentaNYL (SUBLIMAZE) injection 12.5 mcg, 12.5 mcg, Intravenous, Q2H PRN, Loistine Chance, MD, 12.5 mcg at 04/20/19 0533 .  ferrous sulfate tablet 325 mg, 325 mg, Oral, BID WC, Arrien, Jimmy Picket, MD, 325 mg at 04/21/19 0733 .  folic acid (FOLVITE) tablet 1 mg, 1 mg, Oral, Daily, Arrien, Jimmy Picket, MD, 1 mg at 04/20/19 0941 .  heparin injection 5,000 Units, 5,000 Units, Subcutaneous, Q8H, Barb Merino, MD, 5,000 Units at 04/21/19 0548 .  insulin aspart (novoLOG) injection 0-9 Units, 0-9 Units, Subcutaneous, TID WC, Arrien, Jimmy Picket, MD, 1 Units at 04/20/19 1714 .  levothyroxine (SYNTHROID) tablet 75 mcg, 75 mcg, Oral, QAC breakfast, Arrien, Jimmy Picket, MD, 75 mcg at 04/21/19 0548 .  multivitamin with minerals tablet 1 tablet, 1 tablet, Oral, Daily, Barb Merino, MD, 1 tablet at 04/20/19 0940 .  Muscle Rub CREA, , Topical, Q4H PRN, Barb Merino, MD .  ondansetron (ZOFRAN) tablet 4 mg, 4 mg, Oral, Q6H  PRN, 4 mg at 04/18/19 1340 **OR** ondansetron (ZOFRAN) injection 4 mg, 4 mg, Intravenous, Q6H PRN, Arrien, Jimmy Picket, MD, 4 mg at 04/09/19 1603 .  oxyCODONE (Oxy IR/ROXICODONE) immediate release tablet 5 mg, 5 mg, Oral, Q4H PRN, Loistine Chance, MD, 5 mg at 04/21/19 0726 .  pantoprazole (PROTONIX) EC tablet 40 mg, 40 mg, Oral, BID, Arrien, Jimmy Picket, MD, 40 mg at 04/20/19 2132 .  phenol (CHLORASEPTIC) mouth spray 1 spray, 1 spray, Mouth/Throat, PRN, Arrien, Jimmy Picket, MD, 1 spray at 04/10/19 2229 .  polyethylene glycol (MIRALAX / GLYCOLAX) packet 17 g, 17 g, Oral, Daily, Barb Merino, MD, 17 g at 04/20/19 1209 .  simvastatin (ZOCOR) tablet 40 mg, 40 mg, Oral, QHS, Arrien, Jimmy Picket, MD, 40 mg at 04/20/19 2132 .  vitamin B-12 (CYANOCOBALAMIN) tablet 1,000 mcg, 1,000 mcg, Oral, Daily, Arrien, Jimmy Picket, MD, 1,000 mcg at 04/20/19 [phone]  Patients Current Diet:  Diet Order            Diet heart healthy/carb modified Room service appropriate? Yes; Fluid consistency: Thin; Fluid restriction: 1200 mL Fluid  Diet effective now              Precautions / Restrictions Precautions Precautions: Fall Precaution Comments: R neph tube Restrictions Weight Bearing Restrictions: No   Has the patient had 2 or more falls or a fall with injury in the past year? No  Prior Activity Level Limited Community (1-2x/wk): Frequent hospitalizations since 10/2018  Prior Functional Level Self Care: Did the patient need help bathing, dressing, using the toilet or eating? Independent  Indoor Mobility: Did the patient need assistance with walking from room to room (with or without device)? Independent  Stairs: Did the patient need assistance with internal or external stairs (with or without device)? Needed some help  Functional Cognition: Did the patient need help planning regular tasks such as shopping or remembering to take medications? [place] /  Equipment Home Assistive Devices/Equipment: Wheelchair, Environmental consultant (specify type), Kasandra Knudsen (specify quad or straight), Eyeglasses Home Equipment: Environmental consultant - 2 wheels, Cane - single point, Wheelchair - manual, Bedside commode, Shower seat  Prior Device Use: Indicate devices/aids used by the patient prior to current illness, exacerbation or injury? Walker  Current Functional Level Cognition  Overall Cognitive Status: Within Functional Limits for tasks assessed Orientation Level: Oriented X4 General Comments: Limited by weakness and pain, increased time to complete.  Also c/o dizziness following transfer to chair/bed    Extremity Assessment (includes Sensation/Coordination)  Upper Extremity Assessment: Generalized weakness  Lower Extremity Assessment: Generalized weakness    ADLs  Overall ADL's : Needs assistance/impaired Eating/Feeding: Set up Grooming: Oral care, Minimal assistance, Sitting Upper Body Bathing: Minimal assistance Lower Body Bathing: Maximal assistance Upper Body Dressing : Minimal assistance Lower Body Dressing: Total assistance General ADL Comments: sat EOB and performed grooming tasks.      Mobility  Overal bed mobility: Needs Assistance Bed Mobility: Supine to Sit, Sit to Supine Rolling: Mod assist Sidelying to sit: Mod assist Supine to sit: Mod assist Sit to supine: Mod assist, Max assist, +2 for physical assistance General bed mobility comments: Assist for legs and trunk    Transfers  Overall transfer level: Needs assistance Equipment used: Rolling walker (2 wheeled) Transfers: Sit to/from Stand Sit to Stand: Mod assist, Min assist, From elevated surface General transfer comment: NT 2* pt c/o increasing dizziness and noted fall in BP    Ambulation / Gait / Stairs / Wheelchair Mobility  Ambulation/Gait Ambulation/Gait assistance: Herbalist (Feet): 3 Feet Assistive device: Rolling walker (2 wheeled) Gait Pattern/deviations: Decreased step length  - right, Decreased step length - left General Gait Details: Pt took 3-4 small steps infront of bed to chair with RW.    Posture / Balance Balance Overall balance assessment: Needs assistance Sitting-balance support: Bilateral upper extremity supported Sitting balance-Leahy Scale: Poor Postural control: Right lateral lean Standing balance support: Bilateral upper extremity supported Standing balance-Leahy Scale: Poor    Special needs/care consideration BiPAP/CPAP  N/a CPM  N/a Continuous Drip IV  N/a Dialysis n/a Life Vest  N/a Oxygen  N/a Special Bed  N/a Trach Size  N/a Wound Vac n/a Skin ecchymosis to flank and buttocks and labia Bowel mgmt:  continent LBM 5/19 Bladder mgmt: Right nephrostomy and external catheter Diabetic mgmt:  yes Behavioral consideration  N/a Chemo/radiation  N/a   Previous Home Environment  Living Arrangements: Alone  Lives With: Alone Available Help at Discharge: Family, Available 24 hours/day(John will arrange 24/7 assist with family as needed) Type of Home: House Home Layout: Two level, Laundry or work area in basement(does not use the basement) Alternate Level Stairs-Number of Steps: Pt says she has a basement but does not use it. Home Access: Stairs to enter Entrance Stairs-Rails: Right Entrance Stairs-Number of Steps: 1 with no railing in front, back is 2 steps with L railing  Bathroom Shower/Tub: Multimedia programmer: Handicapped height Bathroom Accessibility: Yes How Accessible: Accessible via walker [place]: Yes Type of Home Care Services: [place] (if known): [place] care  Discharge Living Setting Plans for Discharge Living Setting: Patient's home, Alone Type of Home at Discharge: [place]: Two level, Laundry or work area in basement Discharge Home Access: Stairs to enter Entrance Stairs-Rails: Right Entrance Stairs-Number of Steps: 1 Discharge Bathroom Shower/Tub:  Walk-in shower Discharge Bathroom Toilet: Handicapped height Discharge Bathroom Accessibility: Yes How Accessible: Accessible via walker Does the patient have any problems obtaining your medications?: No  Social/Family/Support Systems Patient Roles: Parent Contact Information: Jenny Reichmann, son is main contact to arrange family Anticipated Caregiver: sons and thier wifes Anticipated Caregiver's Contact Information: Jenny Reichmann [phone] Ability/Limitations of Caregiver: Jenny Reichmann can arrange 24/7 assist Caregiver Availability: 24/7 Discharge Plan Discussed with Primary Caregiver: Yes Is Caregiver In Agreement with Plan?: Yes Does Caregiver/Family have Issues with Lodging/Transportation while Pt is in Rehab?: No  Goals/Additional Needs Patient/Family Goal for Rehab: supervision to min asisst with PT and OT Expected length of stay: ELOS 10 to 14 days Pt/Family Agrees to Admission and willing to participate: Yes Program Orientation Provided & Reviewed with Pt/Caregiver Including Roles  & Responsibilities: Yes  Decrease burden of Care through IP rehab admission: n/a  Possible need for SNF placement upon discharge:  Patient refusing  Patient Condition: I have reviewed medical records from [facility]  spoken with patient, and son. I discussed via phone for inpatient rehabilitation assessment.  Patient will benefit from ongoing PT and OT, can actively participate in 3 hours of therapy a day 5 days of the week, and can make measurable gains during the admission.  Patient will also benefit from the coordinated team approach during an Inpatient Acute Rehabilitation admission.  The patient will receive intensive therapy as well as Rehabilitation physician, nursing, social worker, and care management interventions.  Due to bladder management, bowel management, safety, skin/wound care, disease management, medication administration, pain management and patient education the patient requires 24 hour a day  rehabilitation nursing.  The patient is currently mod assist with mobility and basic ADLs.  Discharge setting and therapy post discharge at home with home health is anticipated.  Patient has agreed to participate in the Acute Inpatient Rehabilitation Program and will admit today.  Preadmission Screen Completed By:  Cleatrice Burke RN MSN, 04/21/2019 10:30 AM ______________________________________________________________________   Discussed status with Dr. Posey Pronto  on  04/2019 at 1050 and received approval for admission today.  Admission Coordinator:  Cleatrice Burke, RN MSN  Time 1050 Date  04/21/2019   Assessment/Plan: Diagnosis: Debility  1. Does the need for close, 24 hr/day Medical supervision in concert with the patient's rehab needs make it unreasonable for this patient to be served in a less intensive setting? Potentially  2. Co-Morbidities requiring supervision/potential complications: chronic interstitial cystitis, chronic pelvic pain, history of lumbar discitis treated with prolonged anitibiotics, chronic kidney disease due to HTN,  stage IV with baseline creat about 3 to 3.6, Type 2 diabetes on insulin 3. Due to bladder management, safety, skin/wound care, disease management, medication administration and patient education, does the patient require 24 hr/day rehab nursing? Potentially 4. Does the patient require coordinated care of a physician, rehab nurse, PT (1-2 hrs/day, 5 days/week) and OT (1-2 hrs/day, 5 days/week) to address physical and functional deficits in the context of the above medical diagnosis(es)? Potentially Addressing deficits in the following areas: balance, endurance, locomotion, strength, transferring, bathing, dressing, toileting and psychosocial support 5. Can the patient actively participate in an intensive therapy program of at least 3 hrs of therapy 5 days a week? Yes 6. The potential for patient to make measurable gains while on inpatient rehab is  good 7. Anticipated functional outcomes upon discharge from inpatients are: supervision and min assist PT, supervision and min assist OT, n/a SLP 8. Estimated rehab length of stay to reach the above functional goals is: 10-14 days. 9. Anticipated D/C setting: TBD 10. Anticipated post D/C treatments: HH therapy and Home excercise program 11. Overall Rehab/Functional Prognosis: good  MD Signature: Delice Lesch, MD, ABPMR

## 2019-04-21 NOTE — Progress Notes (Signed)
PT Cancellation Note  Patient Details Name: Erika Fry MRN: [id number] DOB: 03-12-1946   Cancelled Treatment:    Reason Eval/Treat Not Completed: Other (comment)(pt's transportation for d/c on the way as PT entered room for treatment. Did not complete treatment so as to not tire out pt prior to transport to SNF)    Geraldine Solar PT, DPT

## 2019-04-21 NOTE — Discharge Summary (Signed)
Physician Discharge Summary  Erika Fry VQQ:[id number] DOB: [birth year] DOA: 04/09/2019  PCP: [name], MD  Admit date: 04/09/2019 Discharge date: 04/21/2019  Admitted From: Home Disposition:  CIR   Recommendations for Outpatient Follow-up:  1. Follow up with PCP in 1 week 2. Follow up with nephrology Dr. Johnney Ou 3. Follow-up with IR for 6 week routine exchange of nephrostomy tube  4. Follow-up with urology Dr. Louis Meckel 5. Please obtain BMP in 1 week to trend creatinine  Discharge Condition: Stable, improved CODE STATUS: Full  Diet recommendation: Heart healthy   Brief/Interim Summary: Erika Fry is a 73 year old female with history of chronic interstitial cystitis, chronic pelvic pain, history of lumbar discitis treated with prolonged antibiotics, chronic kidney disease due to hypertension, stage IV with baseline creatinine about 3-3.6, type 2 diabetes on insulin, complete heart block status post dual-chamber pacemaker, diastolic heart failure, hypothyroidism and anemia who was brought to interventional radiology for right nephrostomy tube placement and found to have acute kidney injury as well as severe persistent pain so she was admitted to the hospital.  Palliative care medicine was consulted for pain control.  Patient was treated with IV fluids, renal function improved and stabilized.  She will have outpatient follow-up with nephrology.  On day of discharge, she was feeling well with well-controlled right-sided flank pain.  She was having adequate urine output through pure wick as well as right-sided nephrostomy tube.  She was discharged to CIR.  Discharge Diagnoses:  Principal Problem:   Hydronephrosis Active Problems:   Mobitz type 2 second degree AV block   CKD (chronic kidney disease) stage 4, GFR 15-29 ml/min (HCC)   Diet-controlled diabetes mellitus (HCC)   Essential hypertension   Interstitial cystitis   AKI (acute kidney injury) ([facility])   Acquired  hydronephrosis with ureteropelvic junction (UPJ) obstruction   Pain in joint involving pelvic region and thigh   Dysuria   Palliative care by specialist   Acute kidney injury on stage IV chronic kidney disease complicated with hyponatremia, hyperkalemia and metabolic acidosis:  Treated with bicarbonate infusion. Now off IV fluids. Renal functions has been fluctuating, but overall stable. Maintaining adequate urine output. She will have outpatient follow-up by nephrology.   Febrile episode: No focus of infection found.  Was treated with antibiotics and now discontinued.  Has been afebrile now  Intractable back pain, pelvic pain with interstitial cystitis:  Treated with multiple agents including oxycodone, oxybutynin and darifenacin.  Appreciate palliative care input. Patient does have history of lumbar discitis and significant pelvic pain. MRI was done that showed mostly chronic changes.  No acute infection or cord compression.   Pain much better controlled today.  Right-sided hydronephrosis with UPJ obstruction: Obstruction due to?  Interstitial cystitis and scarring.  Nephrostomy tube draining well.   Mobitz type II second-degree AV block status post pacemaker:  Stable  Diet controlled type 2 diabetes: On sliding scale in the hospital.  Hypothyroidism: On Synthroid.  Euthyroid.  Discharge Instructions   Allergies as of 04/21/2019      Reactions   Baclofen Other (See Comments)   somnolence   Codeine Other (See Comments)   Increases Pain and couldn't sleep      Medication List    STOP taking these medications   amitriptyline 25 MG tablet Commonly known as:  ELAVIL   colchicine 0.6 MG tablet   furosemide 80 MG tablet Commonly known as:  LASIX   gabapentin 300 MG capsule Commonly known as:  NEURONTIN   Gerhardt's  butt cream Crea   potassium chloride 10 MEQ tablet Commonly known as:  K-DUR   predniSONE 10 MG tablet Commonly known as:  DELTASONE     TAKE  these medications   acetaminophen 325 MG tablet Commonly known as:  TYLENOL Take 650 mg by mouth as needed.   ALOE VERA PO Take 3 capsules by mouth 2 (two) times a day.   Biotin 5 MG Caps Take 1 capsule (5 mg total) by mouth daily.   cetirizine 10 MG tablet Commonly known as:  ZYRTEC Take 10 mg by mouth at bedtime.   ferrous sulfate 325 (65 FE) MG tablet Take 1 tablet (325 mg total) by mouth 2 (two) times daily with a meal.   fluticasone 50 MCG/ACT nasal spray Commonly known as:  FLONASE Place 2 sprays into both nostrils daily as needed for allergies or rhinitis.   folic acid 1 MG tablet Commonly known as:  FOLVITE Take 1 tablet (1 mg total) by mouth daily.   levothyroxine 75 MCG tablet Commonly known as:  SYNTHROID Take 75 mcg by mouth daily before breakfast.   MULTI-VITAMIN PO Take 1 tablet by mouth daily.   Myrbetriq 50 MG Tb24 tablet Generic drug:  mirabegron ER Take 50 mg by mouth every morning.   oxyCODONE 5 MG immediate release tablet Commonly known as:  Oxy IR/ROXICODONE Take 1 tablet (5 mg total) by mouth every 4 (four) hours as needed for moderate pain.   pantoprazole 40 MG tablet Commonly known as:  PROTONIX Take 1 tablet (40 mg total) by mouth 2 (two) times daily for 30 days. (take once daily after 1 month)   simvastatin 40 MG tablet Commonly known as:  ZOCOR Take 1 tablet (40 mg total) by mouth at bedtime.   sodium bicarbonate 650 MG tablet Take 2 tablets (1,300 mg total) by mouth 2 (two) times daily.   solifenacin 5 MG tablet Commonly known as:  VESICARE Take 5 mg by mouth every evening.   vitamin B-12 1000 MCG tablet Commonly known as:  CYANOCOBALAMIN Take 1 tablet (1,000 mcg total) by mouth daily.   VITAMIN C PO Take 1 tablet by mouth 2 (two) times daily.      Follow-up Information    Avva, Ravisankar, MD Follow up.   Specialty:  Internal Medicine Contact information: [address] [place] [address] [phone]         Justin Mend, MD Follow up.   Specialty:  Internal Medicine Contact information: [address] [place] [address] [phone]          Allergies  Allergen Reactions  . Baclofen Other (See Comments)    somnolence  . Codeine Other (See Comments)    Increases Pain and couldn't sleep    Consultations:  Urology  Nephrology  Palliative care  IR   Procedures/Studies: Mr Lumbar Spine Wo Contrast  Result Date: 04/19/2019 CLINICAL DATA:  Chronic kidney disease, with severe back pain. Prior diskitis. EXAM: MRI LUMBAR SPINE WITHOUT CONTRAST TECHNIQUE: Multiplanar, multisequence MR imaging of the lumbar spine was performed. No intravenous contrast was administered. COMPARISON:  01/22/2019 MRI lumbar spine. FINDINGS: Segmentation:  Standard. Alignment:  Anatomic. Vertebrae: Continued satisfactory appearance following discitis L4-5 and osteomyelitis at the L4 and L5 levels. T1 and T2 hyperintensity of the vertebral body marrow, consistent with the appearance related to chronic disc space narrowing, and resolution of the prior inflammatory process. No worrisome marrow changes elsewhere, including the visualized sacrum. Conus medullaris and cauda equina: Conus extends to  the L1 level. Conus and cauda equina appear normal. Paraspinal and other soft tissues: Improved RIGHT hydronephrosis. No paravertebral abscess or mass. Disc levels: L1-L2: Annular bulge. Facet arthropathy. RIGHT foraminal protrusion could affect the L1 nerve root. L2-L3: Annular bulge. Facet arthropathy. Mild LEFT foraminal stenosis. L3-L4: Annular bulge. Facet arthropathy. Borderline subarticular zone and foraminal zone narrowing. L4-L5: Near complete to complete loss of interspace height. Posterior element hypertrophy. No concerning features of discitis or epidural abscess. Asymmetric foraminal zone narrowing to the RIGHT, could affect the L4 nerve root although stable from priors. L5-S1:  No significant findings. IMPRESSION:  Changes related to chronic L4-5 diskitis and regional osteomyelitis, with disc space narrowing and Modic type 2 endplate changes. No evidence of vertebral body compression fracture or significant subluxation. No imaging features to suggest a persistent or new spinal infection. Stable multilevel spondylosis, without severe spinal stenosis at any level. Improved RIGHT hydronephrosis. Visualized sacrum and regional pelvis grossly unremarkable. Electronically Signed   By: Staci Righter M.D.   On: 04/19/2019 17:02   US Renal  Result Date: 04/12/2019 CLINICAL DATA:  Inpatient. Acute kidney injury. Status post percutaneous nephrostomy on the right on 04/09/2019 for symptomatic hydronephrosis after lithotripsy and ureteral stent removal. EXAM: RENAL / URINARY TRACT ULTRASOUND COMPLETE COMPARISON:  04/09/2019 percutaneous nephrostomy ultrasound and fluoroscopic images. 02/10/2019 CT abdomen/pelvis. FINDINGS: Right Kidney: Renal measurements: 12.7 x 4.9 x 5.2 cm = volume: 167 mL. Mildly echogenic right renal parenchyma, which is normal in thickness. Mild right hydronephrosis, significantly improved since 04/09/2019 percutaneous nephrostomy ultrasound image. Multiple nonobstructing lower right renal stones, largest 8 mm. No right renal mass. Tip of right percutaneous nephrostomy tube not well demonstrated on this scan. Left Kidney: Renal measurements: 11.0 x 4.8 x 4.9 cm = volume: 140 mL. Mildly echogenic left renal parenchyma, which is normal in thickness. No left hydronephrosis. Simple 1.1 x 1.1 x 1.1 cm lower left renal cyst. Nonobstructing 6 mm upper and 5 mm interpolar left renal stones. Bladder: Nondistended bladder with suggestion of diffuse bladder wall thickening. IMPRESSION: 1. Mild right hydronephrosis, significantly improved compared to the 04/09/2019 percutaneous nephrostomy procedure images. 2. Nonobstructing bilateral nephrolithiasis. 3. Echogenic normal size kidneys, compatible with reported history of  nonspecific acute renal parenchymal disease. 4. Small simple left renal cysts.  No suspicious renal masses. 5. Suggestion of diffuse bladder wall thickening, suggest correlation with urinalysis. No significant bladder distention. Electronically Signed   By: Ilona Sorrel M.D.   On: 04/12/2019 13:44   Ir Nephrostomy Placement Right  Result Date: 04/09/2019 INDICATION: 73 year old female with a history of right-sided stone disease status post lithotripsy with recurrent symptomatic hydronephrosis following ureteral stent removal. She presents for percutaneous nephrostomy tube placement. EXAM: IR NEPHROSTOMY PLACEMENT RIGHT COMPARISON:  CT abdomen/pelvis 02/10/2019; renal ultrasound 03/19/2019 MEDICATIONS: 400 mg ciprofloxacin; The antibiotic was administered in an appropriate time frame prior to skin puncture. ANESTHESIA/SEDATION: Fentanyl 100 mcg IV; Versed 3 mg IV Moderate Sedation Time:  30 minutes The patient was continuously monitored during the procedure by the interventional radiology nurse under my direct supervision. CONTRAST:  20 mL Omnipaque 300-administered into the collecting system(s) FLUOROSCOPY TIME:  Fluoroscopy Time: 1 minutes 42 seconds (9 mGy). COMPLICATIONS: None immediate. TECHNIQUE: The procedure, risks, benefits, and alternatives were explained to the patient. Questions regarding the procedure were encouraged and answered. The patient understands and consents to the procedure. The right flank was prepped with chlorhexidine in a sterile fashion, and a sterile drape was applied covering the operative field. A sterile gown  and sterile gloves were used for the procedure. Local anesthesia was provided with 1% Lidocaine. The right flank was interrogated with ultrasound and the left kidney identified. The kidney is hydronephrotic. A suitable access site on the skin overlying the lower pole, posterior calix was identified. After local mg anesthesia was achieved, a small skin nick was made with an 11  blade scalpel. A 21 gauge Accustick needle was then advanced under direct sonographic guidance into the lower pole of the right kidney. A 0.018 inch wire was advanced under fluoroscopic guidance into the left renal collecting system. The Accustick sheath was then advanced over the wire and a 0.018 system exchanged for a 0.035 system. Gentle hand injection of contrast material confirms placement of the sheath within the renal collecting system. There is moderate hydronephrosis. The tract from the scan into the renal collecting system was then dilated serially to 10-French. A 10-French Cook all-purpose drain was then placed and positioned under fluoroscopic guidance. The locking loop is well formed within the left renal pelvis. The catheter was secured to the skin with 2-0 Prolene and a sterile bandage was placed. Catheter was left to gravity bag drainage. IMPRESSION: Successful placement of a right 10 French percutaneous nephrostomy tube. The urine was opaque and milky in color likely representing milk of calcium. PLAN: Return to interventional Radiology in 6 weeks for tube check and change. Signed, Criselda Peaches, MD, [facility] Radiology Electronically Signed   By: Jacqulynn Cadet M.D.   On: 04/09/2019 11:24       Discharge Exam: Vitals:   04/21/19 0544 04/21/19 0929  BP: (!) 96/59   Pulse: 67   Resp: 16   Temp: 99.1 F (37.3 C) 98.2 F (36.8 C)  SpO2: 99%     General: Pt is alert, awake, not in acute distress Cardiovascular: RRR, S1/S2 +, no rubs, no gallops Respiratory: CTA bilaterally, no wheezing, no rhonchi Abdominal: Soft, NT, ND, bowel sounds + GU: Right nephrostomy tube draining clear yellow urine Extremities: no edema, no cyanosis    The results of significant diagnostics from this hospitalization (including imaging, microbiology, ancillary and laboratory) are listed below for reference.     Microbiology: Recent  Results (from the past 240 hour(s))  Culture, Urine     Status: None   Collection Time: 04/13/19  1:46 PM  Result Value Ref Range Status   Specimen Description   Final    URINE, CATHETERIZED Performed at [facility] [address], [place], [place] [address]    Special Requests   Final    Normal Performed at [facility], [place] [address], [place], [place] [address]    Culture   Final    Multiple bacterial morphotypes present, none predominant. Suggest appropriate recollection if clinically indicated.   Report Status 04/15/2019 FINAL  Final  Culture, blood (routine x 2)     Status: None   Collection Time: 04/15/19  9:55 AM  Result Value Ref Range Status   Specimen Description   Final    BLOOD RIGHT ARM Performed at [facility] [address], [place], [place] [address]    Special Requests   Final    BOTTLES DRAWN AEROBIC AND ANAEROBIC Blood Culture adequate volume Performed at [facility] [address], [place], [place] [address]    Culture   Final    NO GROWTH 5 DAYS Performed at [facility], [place] [address],  [place], [place] [address]    Report Status 04/20/2019 FINAL  Final  Culture, blood (routine x 2)     Status: None   Collection Time: 04/15/19  9:56 AM  Result Value Ref Range Status   Specimen Description   Final    BLOOD LEFT HAND Performed at [facility] [address], [place], [place] [address]    Special Requests   Final    BOTTLES DRAWN AEROBIC AND ANAEROBIC Blood Culture adequate volume Performed at [facility] [address] [place], [place] [address]    Culture   Final    NO GROWTH 5 DAYS Performed at [facility], [place] [address], [place],  [address]    Report Status 04/20/2019 FINAL  Final     Labs: BNP (last 3 results) Recent Labs    06/07/18 0227 02/10/19 1715  BNP 228.6* 17.4   Basic Metabolic  Panel: Recent Labs  Lab 04/15/19 0534 04/16/19 0532 04/17/19 0733 04/18/19 0502 04/19/19 0431 04/21/19 0529  NA 130* 131* 133* 133* 131* 132*  K 3.3* 3.1* 4.4 4.5 4.1 5.0  CL 89* 93* 96* 95* 97* 97*  CO2 30 28 25 25 24 24   GLUCOSE 112* 98 115* 100* 94 98  BUN 91* 83* 72* 71* 74* 70*  CREATININE 4.46* 4.25* 4.10* 4.36* 4.20* 4.63*  CALCIUM 7.7* 7.7* 8.4* 8.4* 8.7* 8.9  MG 1.5*  --   --   --   --   --   PHOS 2.9 3.1 2.5 2.6 3.1 3.7   Liver Function Tests: Recent Labs  Lab 04/16/19 0532 04/17/19 0733 04/18/19 0502 04/19/19 0431 04/21/19 0529  ALBUMIN 1.9* 2.0* 2.1* 2.1* 2.0*   No results for input(s): LIPASE, AMYLASE in the last 168 hours. No results for input(s): AMMONIA in the last 168 hours. CBC: Recent Labs  Lab 04/15/19 0534  WBC 6.8  NEUTROABS 4.8  HGB 8.2*  HCT 25.0*  MCV 89.0  PLT 81*   Cardiac Enzymes: No results for input(s): CKTOTAL, CKMB, CKMBINDEX, TROPONINI in the last 168 hours. BNP: Invalid input(s): POCBNP CBG: Recent Labs  Lab 04/20/19 0711 04/20/19 1141 04/20/19 1635 04/20/19 2128 04/21/19 0729  GLUCAP 134* 127* 131* 126* 95   D-Dimer No results for input(s): DDIMER in the last 72 hours. Hgb A1c No results for input(s): HGBA1C in the last 72 hours. Lipid Profile No results for input(s): CHOL, HDL, LDLCALC, TRIG, CHOLHDL, LDLDIRECT in the last 72 hours. Thyroid function studies No results for input(s): TSH, T4TOTAL, T3FREE, THYROIDAB in the last 72 hours.  Invalid input(s): FREET3 Anemia work up No results for input(s): VITAMINB12, FOLATE, FERRITIN, TIBC, IRON, RETICCTPCT in the last 72 hours. Urinalysis    Component Value Date/Time   COLORURINE YELLOW 04/15/2019 0923   APPEARANCEUR TURBID (A) 04/15/2019 0923   LABSPEC 1.010 04/15/2019 0923   PHURINE 6.0 04/15/2019 0923   GLUCOSEU NEGATIVE 04/15/2019 0923   HGBUR MODERATE (A) 04/15/2019 0923   BILIRUBINUR NEGATIVE 04/15/2019 0923   KETONESUR NEGATIVE 04/15/2019 0923    PROTEINUR 100 (A) 04/15/2019 0923   NITRITE NEGATIVE 04/15/2019 0923   LEUKOCYTESUR MODERATE (A) 04/15/2019 0923   Sepsis Labs Invalid input(s): PROCALCITONIN,  WBC,  LACTICIDVEN Microbiology Recent Results (from the past 240 hour(s))  Culture, Urine     Status: None   Collection Time: 04/13/19  1:46 PM  Result Value Ref Range Status   Specimen Description   Final    URINE, CATHETERIZED Performed at [facility]  [facility], [place] [address] [place], [place] [address]    Special Requests   Final    Normal Performed at [facility], [place] [address] [place], [place] [address]    Culture   Final    Multiple bacterial morphotypes present, none predominant. Suggest appropriate recollection if clinically indicated.   Report Status 04/15/2019 FINAL  Final  Culture, blood (routine x 2)     Status: None   Collection Time: 04/15/19  9:55 AM  Result Value Ref Range Status   Specimen Description   Final    BLOOD RIGHT ARM Performed at [facility] [address] [place], [place] [address]    Special Requests   Final    BOTTLES DRAWN AEROBIC AND ANAEROBIC Blood Culture adequate volume Performed at [facility] [address] [place], [place] [address]    Culture   Final    NO GROWTH 5 DAYS Performed at [facility], [place] [address] [place], [place] [address]    Report Status 04/20/2019 FINAL  Final  Culture, blood (routine x 2)     Status: None   Collection Time: 04/15/19  9:56 AM  Result Value Ref Range Status   Specimen Description   Final    BLOOD LEFT HAND Performed at [facility] [address] [place], [place] [address]    Special Requests   Final    BOTTLES DRAWN AEROBIC AND ANAEROBIC Blood Culture adequate volume Performed at [facility] [address] [place],  [address]    Culture   Final    NO GROWTH 5 DAYS Performed at [facility],  [place] [address] [place],  [address]    Report Status 04/20/2019 FINAL  Final     Patient was seen and examined on the day of discharge and was found to be in stable condition. Time coordinating discharge: 35 minutes including assessment and coordination of care, as well as examination of the patient.   SIGNED:  Dessa Phi, DO [facility] [email or web address] 04/21/2019, 10:05 AM

## 2019-04-21 NOTE — Evaluation (Signed)
Occupational Therapy Assessment and Plan  Patient Details  Name: Erika Fry MRN: [id number] Date of Birth: 1946/01/30  OT Diagnosis: muscle weakness (generalized); decreased activity tolerance Rehab Potential: Rehab Potential (ACUTE ONLY): Excellent ELOS: 7-10 days   Today's Date: 04/22/2019 OT Individual Time: 6546-5035 OT Individual Time Calculation (min): 53 min     Problem List:  Patient Active Problem List   Diagnosis Date Noted  . Chronic kidney disease   . Hypotension   . Drug induced constipation   . Neurogenic bladder   . Debility 04/21/2019  . Acquired hydronephrosis with ureteropelvic junction (UPJ) obstruction   . Pain in joint involving pelvic region and thigh   . Dysuria   . Palliative care by specialist   . AKI (acute kidney injury) ([place]) 04/10/2019  . Hydronephrosis 04/09/2019  . Interstitial cystitis 04/09/2019  . Bilateral leg edema 02/25/2019  . Chronic heart failure with preserved ejection fraction ([place]) 02/24/2019  . Cardiac pacemaker in situ 02/24/2019  . ARF (acute renal failure) ([place]) 02/10/2019  . Peripheral edema   . Fever 01/17/2019  . Acute lower UTI 01/17/2019  . Pressure injury of skin 01/17/2019  . Gross hematuria 01/14/2019  . Fluid retention   . Generalized edema   . Hypokalemia   . Acute right-sided low back pain with right-sided sciatica   . Acute blood loss anemia   . Anemia of chronic disease   . Thrombocytopenia ([place])   . Diabetes mellitus ([place])   . Morbid obesity ([place])   . Benign essential HTN   . Hypoalbuminemia due to protein-calorie malnutrition ([place])   . Occult blood in stools   . Gastric polyp   . Lumbar discitis 05/17/2018  . Blood loss anemia 05/12/2018  . Hyperlipidemia associated with type 2 diabetes mellitus ([place]) 05/10/2018  . Essential hypertension 04/29/2018  . CKD (chronic kidney disease) stage 4, GFR 15-29 ml/min (H[facility])   . Diet-controlled diabetes mellitus ([place])   . GERD (gastroesophageal reflux  disease)   . Gout   . High cholesterol   . Hypothyroidism   . Iron deficiency anemia   . AV block, Mobitz 2 03/25/2018  . Mobitz type 2 second degree AV block 03/20/2018  . Aortic stenosis 03/20/2018  . Exertional dyspnea 03/19/2018    Past Medical History:  Past Medical History:  Diagnosis Date  . Arthritis    "knees, thumbs" (03/25/2018)  . CKD (chronic kidney disease), stage IV ([place])   . Diet-controlled diabetes mellitus ([place])   . GERD (gastroesophageal reflux disease)   . Gout    "on daily RX" (03/25/2018)  . Heart murmur   . High cholesterol   . Hypertension   . Hypothyroidism   . Iron deficiency anemia    "had to get an iron infusion"  . Migraine    "used to have them growing up" (03/25/2018)  . Presence of permanent cardiac pacemaker 03/25/2018   Past Surgical History:  Past Surgical History:  Procedure Laterality Date  . APPENDECTOMY    . BIOPSY  05/19/2018   Procedure: BIOPSY;  Surgeon: Ladene Artist, MD;  Location: [facility];  Service: Endoscopy;;  . Consuela Mimes W/ RETROGRADES Bilateral 01/14/2019   Procedure: CYSTOSCOPY WITH RETROGRADE PYELOGRAM BILATERAL HYDRODISTENTION;  Surgeon: Ardis Hughs, MD;  Location: [facility];  Service: Urology;  Laterality: Bilateral;  . ESOPHAGOGASTRODUODENOSCOPY N/A 05/19/2018   Procedure: ESOPHAGOGASTRODUODENOSCOPY (EGD);  Surgeon: Ladene Artist, MD;  Location[facility] ENDOSCOPY;  Service: Endoscopy;  Laterality: N/A;  . ESOPHAGOGASTRODUODENOSCOPY (EGD) WITH PROPOFOL  N/A 01/28/2019   Procedure: ESOPHAGOGASTRODUODENOSCOPY (EGD) WITH PROPOFOL;  Surgeon: Rush Landmark Telford Nab., MD;  Location: [place];  Service: Gastroenterology;  Laterality: N/A;  . INSERT / REPLACE / REMOVE PACEMAKER  03/25/2018  . IR FLUORO GUIDE CV LINE RIGHT  05/18/2018  . IR LUMBAR DISC ASPIRATION W/IMG GUIDE  05/15/2018  . IR NEPHROSTOMY PLACEMENT RIGHT  04/09/2019  . IR REMOVAL TUN CV CATH W/O FL  07/23/2018  . IR US GUIDE VASC ACCESS RIGHT  05/18/2018  .  KNEE ARTHROSCOPY Bilateral   . PACEMAKER IMPLANT N/A 03/25/2018   Procedure: PACEMAKER IMPLANT;  Surgeon: Evans Lance, MD;  Location: [facility];  Service: Cardiovascular;  Laterality: N/A;  . PACEMAKER PLACEMENT Left 03/2018  . POLYPECTOMY  01/28/2019   Procedure: POLYPECTOMY;  Surgeon: Mansouraty, Telford Nab., MD;  Location: [place];  Service: Gastroenterology;;  . RIGHT HEART CATH N/A 03/20/2018   Procedure: RIGHT HEART CATH;  Surgeon: Nigel Mormon, MD;  Location: [place];  Service: Cardiovascular;  Laterality: N/A;  . TEE WITHOUT CARDIOVERSION N/A 03/20/2018   Procedure: TRANSESOPHAGEAL ECHOCARDIOGRAM (TEE);  Surgeon: Nigel Mormon, MD;  Location: [facility] ENDOSCOPY;  Service: Cardiovascular;  Laterality: N/A;  . TONSILLECTOMY    . URETEROSCOPY WITH HOLMIUM LASER LITHOTRIPSY Right 01/14/2019   Procedure: URETEROSCOPY WITH HOLMIUM LASER LITHOTRIPSY BLADDER BIOPSIES RIGHT STENT PLACEMENT;  Surgeon: Ardis Hughs, MD;  Location: [facility];  Service: Urology;  Laterality: Right;    Assessment & Plan Clinical Impression: Patient is a 73 y.o. year old female with recent admission to the hospital with hydronephrosis s/p R nephrostomy tube/drain placement. Hx of neuropathy, DM, CKD, obesity, gout, OA, LE edema, osteopenia, venous insufficiency  .  Patient transferred to CIR on 04/21/2019 .    Patient currently requires min/ mod with basic self-care skills secondary to muscle weakness, decreased activity tolerance and decreased standing balance, decreased postural control and decreased balance strategies.  Prior to hospitalization, patient could complete BADL with independent .  Patient will benefit from skilled intervention to increase independence with basic self-care skills prior to discharge home with care partner.  Anticipate patient will require intermittent supervision and follow up home health.  OT - End of Session Endurance Deficit: Yes Endurance Deficit  Description: Frequent rest breaks within BADL tasks OT Assessment Rehab Potential (ACUTE ONLY): Excellent OT Patient demonstrates impairments in the following area(s): Balance;Endurance;Motor OT Basic ADL's Functional Problem(s): Grooming;Bathing;Dressing;Toileting OT Transfers Functional Problem(s): Toilet;Tub/Shower OT Additional Impairment(s): None OT Plan OT Intensity: Minimum of 1-2 x/day, 45 to 90 minutes OT Frequency: 5 out of 7 days OT Duration/Estimated Length of Stay: 7-10 days OT Treatment/Interventions: Balance/vestibular training;Community reintegration;Discharge planning;DME/adaptive equipment instruction;Functional mobility training;Patient/family education;Pain management;Psychosocial support;Self Care/advanced ADL retraining;Therapeutic Activities;Therapeutic Exercise;UE/LE Coordination activities;UE/LE Strength taining/ROM OT Basic Self-Care Anticipated Outcome(s): Mod I OT Toileting Anticipated Outcome(s): Mod I OT Bathroom Transfers Anticipated Outcome(s): supervision OT Recommendation Patient destination: Home Follow Up Recommendations: Home health OT Equipment Recommended: To be determined   Skilled Therapeutic Intervention  Pt greeted semi-reclined in bed and agreeable to OT treatment session. OT eval completed addressing rehab process, OT purpose, POC, ELOS, and goals. Pt recalls previous admission to CIR. Pt came to sitting EOB with HOB elevated and increased time, CGA. Sit<>stand with min A, then min A to pivot to wc-increased time to take small steps 2/2 reported neuropothy in B LEs and arthritic knees. Bathing/dressing completed at the sink with multiple sit<>stand requiring min A. Pt needed OT assist to wash buttocks  2/2 slight posterior LOB when removing unilateral UE from sink counter. Pt needed assistance to thread B LEs into pants today, stating it was difficult to lift her legs. Discussed PLOF with pt stating she was independent with BADLs and most homemaking  tasks. She was still getting HHPT 2x a week. Pt left seated in wc with alarm belt on and needs met.   OT Evaluation Precautions/Restrictions  Precautions Precautions: Fall Precaution Comments: R neph tube Restrictions Weight Bearing Restrictions: No Pain Pain Assessment Pain Scale: 0-10 Pain Score: 0-No pain Home Living/Prior Functioning Home Living Family/patient expects to be discharged to:: Private residence Living Arrangements: Alone Available Help at Discharge: Family, Available 24 hours/day Type of Home: House Home Access: Stairs to enter [employer] of Steps: 1 step w/ R railing  Entrance Stairs-Rails: Right Home Layout: Two level, Laundry or work area in basement Alternate Therapist, sports of Steps: Pt says she has a basement but does not use it. Lives on main level.  Bathroom Shower/Tub: Multimedia programmer: Handicapped height Bathroom Accessibility: Yes Additional Comments: Has shower chair, w/c, RW, grabber, sock-aid  Lives With: Alone IADL History Homemaking Responsibilities: Yes Leisure and Hobbies: Elbert Ewings out with friends, work in the yard IADL Comments: Family does her laundry, was driving, cooking, cleaning Prior Function Level of Independence: Independent with basic ADLs, Independent with homemaking with ambulation, Independent with transfers, Independent with gait  Able to Take Stairs?: Yes Driving: Yes Vocation: Retired Comments: mod I with adls/iadls.  Sleeps in recliner (helps with her allergies and breathing)  ADL ADL Grooming: Supervision/safety Upper Body Bathing: Setup Lower Body Bathing: Moderate assistance Upper Body Dressing: Contact guard Lower Body Dressing: Moderate assistance Toileting: Moderate assistance Toilet Transfer: Minimal assistance Toilet Transfer Method: Stand pivot Tub/Shower Transfer: Unable to assess Gaffer Transfer: Unable to assess Vision Baseline Vision/History: Wears glasses Wears  Glasses: At all times Patient Visual Report: No change from baseline Vision Assessment?: No apparent visual deficits Perception  Perception: Within Functional Limits Praxis Praxis: Intact Cognition Overall Cognitive Status: Within Functional Limits for tasks assessed Arousal/Alertness: Awake/alert Orientation Level: Person;Place;Situation Person: Oriented Place: Oriented Situation: Oriented Year: 2020 Month: May Day of Week: Correct Memory: Appears intact Immediate Memory Recall: Sock;Blue;Bed Memory Recall: Sock;Blue;Bed Memory Recall Sock: Without Cue Memory Recall Blue: Without Cue Memory Recall Bed: Without Cue Awareness: Appears intact Problem Solving: Appears intact Safety/Judgment: Appears intact Sensation Sensation Light Touch: Impaired Detail Peripheral sensation comments: Impaired distally>proximally  Light Touch Impaired Details: Impaired LUE;Impaired RUE;Impaired LLE;Impaired RLE Additional Comments: 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 Coordination Gross Motor Movements are Fluid and Coordinated: Yes Fine Motor Movements are Fluid and Coordinated: Yes Motor  Motor Motor: Within Functional Limits Motor - Skilled Clinical Observations: generalized weakness Mobility  Bed Mobility Bed Mobility: Rolling Right;Rolling Left;Sit to Supine;Supine to Sit Rolling Right: Supervision/verbal cueing Rolling Left: Supervision/Verbal cueing Supine to Sit: Moderate Assistance - Patient 50-74% Sit to Supine: Moderate Assistance - Patient 50-74% Transfers Sit to Stand: Minimal Assistance - Patient > 75% Stand to Sit: Minimal Assistance - Patient > 75%  Trunk/Postural Assessment  Cervical Assessment Cervical Assessment: Within Functional Limits Thoracic Assessment Thoracic Assessment: Exceptions to WFL(kyphotic ) Lumbar Assessment Lumbar Assessment: Exceptions to WFL(posterior pelvic tilt) Postural Control Postural Control: Within Functional Limits   Balance Balance Balance Assessed: Yes Static Sitting Balance Static Sitting - Balance Support: No upper extremity supported;Feet supported Static Sitting - Level of Assistance: 5: Stand by assistance Dynamic Sitting Balance Dynamic Sitting - Balance Support: No upper  extremity supported;Feet supported Dynamic Sitting - Level of Assistance: 5: Stand by assistance Static Standing Balance Static Standing - Balance Support: No upper extremity supported;During functional activity Static Standing - Level of Assistance: 4: Min assist Dynamic Standing Balance Dynamic Standing - Balance Support: No upper extremity supported;During functional activity Dynamic Standing - Level of Assistance: 4: Min assist Extremity/Trunk Assessment RUE Assessment RUE Assessment: Within Functional Limits LUE Assessment LUE Assessment: Within Functional Limits     Refer to Care Plan for Long Term Goals  Recommendations for other services: None    Discharge Criteria: Patient will be discharged from OT if patient refuses treatment 3 consecutive times without medical reason, if treatment goals not met, if there is a change in medical status, if patient makes no progress towards goals or if patient is discharged from hospital.  The above assessment, treatment plan, treatment alternatives and goals were discussed and mutually agreed upon: by patient  Valma Cava 04/22/2019, 12:51 PM

## 2019-04-21 NOTE — Progress Notes (Addendum)
Received pt. As a new admission,pt. Has been oriented to the unit routine and protocol.Safety plan was explain,fall prevention plan was explained as well.Pt. has a extensive area on her buttocks that extends to both inner thighs area, front and back.The area is red with some excoriated open spots.As per the RN report and the pt.'s Verbal statement that is a burn from a heating pad.Reesa Chew [facility] has been notified.Keep assessing closely.

## 2019-04-21 NOTE — Progress Notes (Signed)
Inpatient Rehabilitation Admissions Coordinator  Insurance has approved an inpt rehab admit and bed is available to admit today. I contacted Dr. Maylene Roes and she will arrange d/c to CIR today. I spoke with pt by phone and she is in agreement. I have alerted RN CM, Juliann Pulse and son, Jenny Reichmann by text/voicemail. I will make the arrangements to admit today. I have arranged pickup with [facility] for 1 pm today. Patient admitting to 4w20 by Dr. Delice Lesch . RN, Abby made [phone].  Danne Baxter, RN, MSN Rehab Admissions Coordinator [phone] 04/21/2019 10:28 AM

## 2019-04-21 NOTE — H&P (Signed)
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  She developed sever pain post procedure and as lives alone, was admitted for IV morphine/pain management. She continued to be limited by bladder/suprapubic pain and urology consulted for input. She has tried multiple treatment with minimal improvement in symptoms. Analgesia as well lumbar MRI recommended due rule out neurological cause of pain. She spiked fever on 5/14 and she was started on meropenum -- BC/UCS negative therefore antibiotics d/c.    Acute on chronic renal failure with metabolic acidosis treated with bicarb and fluids with some improvement. Dr. Royce Macadamia consulted and recommended discontinuation of IVF and Bicarb past 1 L as well as monitoring closely for fluid overload and UOP.   Palliative care consulted to discuss [place] and pain management--patient elected on full scope of care. She was opposed to using gabapentin and pain control improving with use of IV fentanyl.   MRI  Lumbar spine done changes due to chronic L4/5 diskitis with lumbar spondylosis --no evidence of persistent or new infection and improvement in right hydronphrosis. Patient has had gradual functional decline since last fall and pending HD in the near future.  She lives alone and has had significant decline in the past few months with multiple admissions.  Therapy ongoing and patient limited by pain and dizziness. She has declined SNF and CIR recommended due to debility.    Review of Systems  Constitutional: Positive for malaise/fatigue. Negative for chills and fever.  HENT: Negative for hearing loss, sore throat and tinnitus.   Eyes: Negative for blurred  vision and double vision.  Respiratory: Negative for cough, sputum production and shortness of breath.   Cardiovascular: Positive for leg swelling. Negative for chest pain and palpitations.  Gastrointestinal: Positive for constipation. Negative for abdominal pain, heartburn and nausea.       Has been straying to have BM without results.   Genitourinary: Positive for frequency and urgency.       Can't urinate without Pure wick.  Musculoskeletal: Positive for myalgias. Negative for back pain.  Skin: Negative for rash.  Neurological: Positive for weakness. Negative for dizziness and headaches.  Psychiatric/Behavioral: The patient does not have insomnia.   All other systems reviewed and are negative.    Past Medical History:  Diagnosis Date  . Arthritis    "knees, thumbs" (03/25/2018)  . CKD (chronic kidney disease), stage IV ([place])   . Diet-controlled diabetes mellitus ([place])   . GERD (gastroesophageal reflux disease)   . Gout    "on daily RX" (03/25/2018)  . Heart murmur   . High cholesterol   . Hypertension   . Hypothyroidism   . Iron deficiency anemia    "had to get an iron infusion"  . Migraine    "used to have them growing up" (03/25/2018)  . Presence of permanent cardiac pacemaker 03/25/2018    Past Surgical History:  Procedure Laterality Date  . APPENDECTOMY    . BIOPSY  05/19/2018   Procedure: BIOPSY;  Surgeon: Ladene Artist, MD;  Location: [place] [facility];  Service: Endoscopy;;  . Consuela Mimes W/ RETROGRADES Bilateral 01/14/2019   Procedure: CYSTOSCOPY WITH RETROGRADE PYELOGRAM BILATERAL HYDRODISTENTION;  Surgeon: Ardis Hughs, MD;  Location:  [facility];  Service: Urology;  Laterality: Bilateral;  . ESOPHAGOGASTRODUODENOSCOPY N/A 05/19/2018   Procedure: ESOPHAGOGASTRODUODENOSCOPY (EGD);  Surgeon: Ladene Artist, MD;  Location: [facility] ENDOSCOPY;  Service: Endoscopy;  Laterality: N/A;  . ESOPHAGOGASTRODUODENOSCOPY (EGD) WITH PROPOFOL N/A 01/28/2019   Procedure:  ESOPHAGOGASTRODUODENOSCOPY (EGD) WITH PROPOFOL;  Surgeon: Rush Landmark Telford Nab., MD;  Location: [place];  Service: Gastroenterology;  Laterality: N/A;  . INSERT / REPLACE / REMOVE PACEMAKER  03/25/2018  . IR FLUORO GUIDE CV LINE RIGHT  05/18/2018  . IR LUMBAR DISC ASPIRATION W/IMG GUIDE  05/15/2018  . IR NEPHROSTOMY PLACEMENT RIGHT  04/09/2019  . IR REMOVAL TUN CV CATH W/O FL  07/23/2018  . IR US GUIDE VASC ACCESS RIGHT  05/18/2018  . KNEE ARTHROSCOPY Bilateral   . PACEMAKER IMPLANT N/A 03/25/2018   Procedure: PACEMAKER IMPLANT;  Surgeon: Evans Lance, MD;  Location: [place] [facility];  Service: Cardiovascular;  Laterality: N/A;  . PACEMAKER PLACEMENT Left 03/2018  . POLYPECTOMY  01/28/2019   Procedure: POLYPECTOMY;  Surgeon: Mansouraty, Telford Nab., MD;  Location: [place];  Service: Gastroenterology;;  . RIGHT HEART CATH N/A 03/20/2018   Procedure: RIGHT HEART CATH;  Surgeon: Nigel Mormon, MD;  Location: [place] [facility];  Service: Cardiovascular;  Laterality: N/A;  . TEE WITHOUT CARDIOVERSION N/A 03/20/2018   Procedure: TRANSESOPHAGEAL ECHOCARDIOGRAM (TEE);  Surgeon: Nigel Mormon, MD;  Location: [facility] ENDOSCOPY;  Service: Cardiovascular;  Laterality: N/A;  . TONSILLECTOMY    . URETEROSCOPY WITH HOLMIUM LASER LITHOTRIPSY Right 01/14/2019   Procedure: URETEROSCOPY WITH HOLMIUM LASER LITHOTRIPSY BLADDER BIOPSIES RIGHT STENT PLACEMENT;  Surgeon: Ardis Hughs, MD;  Location: [facility];  Service: Urology;  Laterality: Right;    Family History  Problem Relation Age of Onset  . Hypertension Mother   . Diabetes Mellitus II Father   . Heart disease Father   . Heart attack Father   . Gastric cancer Brother   . Diabetes Mellitus II Brother   . Stomach cancer Brother     Social History:  Lives alone and independent PTA. She reports that she has never smoked. She has never used smokeless tobacco. She reports previous alcohol use. She reports that she does not use  drugs.   Allergies  Allergen Reactions  . Baclofen Other (See Comments)    somnolence  . Codeine Other (See Comments)    Increases Pain and couldn't sleep    Medications Prior to Admission  Medication Sig Dispense Refill  . acetaminophen (TYLENOL) 325 MG tablet Take 650 mg by mouth as needed.    . ALOE VERA PO Take 3 capsules by mouth 2 (two) times a day.    . Ascorbic Acid (VITAMIN C PO) Take 1 tablet by mouth 2 (two) times daily.    . Biotin 5 MG CAPS Take 1 capsule (5 mg total) by mouth daily.  0  . cetirizine (ZYRTEC) 10 MG tablet Take 10 mg by mouth at bedtime.     . ferrous sulfate 325 (65 FE) MG tablet Take 1 tablet (325 mg total) by mouth 2 (two) times daily with a meal. 60 tablet 0  . fluticasone (FLONASE) 50 MCG/ACT nasal spray Place 2 sprays into both nostrils daily as needed for allergies or rhinitis.    . folic acid (FOLVITE) 1 MG tablet Take 1 tablet (1 mg total) by mouth daily. 30 tablet 0  . levothyroxine (SYNTHROID, LEVOTHROID) 75 MCG tablet Take 75 mcg by mouth daily before breakfast.    .  mirabegron ER (MYRBETRIQ) 50 MG TB24 tablet Take 50 mg by mouth every morning.     . Multiple Vitamin (MULTI-VITAMIN PO) Take 1 tablet by mouth daily.    [name] Kitchen oxyCODONE (OXY IR/ROXICODONE) 5 MG immediate release tablet Take 1 tablet (5 mg total) by mouth every 4 (four) hours as needed for moderate pain. 30 tablet 0  . pantoprazole (PROTONIX) 40 MG tablet Take 1 tablet (40 mg total) by mouth 2 (two) times daily for 30 days. (take once daily after 1 month) 60 tablet 0  . simvastatin (ZOCOR) 40 MG tablet Take 1 tablet (40 mg total) by mouth at bedtime.    . sodium bicarbonate 650 MG tablet Take 2 tablets (1,300 mg total) by mouth 2 (two) times daily. 60 tablet 0  . solifenacin (VESICARE) 5 MG tablet Take 5 mg by mouth every evening.     . vitamin B-12 (CYANOCOBALAMIN) 1000 MCG tablet Take 1 tablet (1,000 mcg total) by mouth daily. 30 tablet 0    Drug Regimen Review  Drug regimen was  reviewed and remains appropriate with no significant issues identified  Home: Home Living Family/patient expects to be discharged to:: Private residence Living Arrangements: Alone Available Help at Discharge: Family, Available 24 hours/day(John will arrange 24/7 assist with family as needed) Type of Home: House Home Access: Stairs to enter [employer] of Steps: 1 with no railing in front, back is 2 steps with L railing  Entrance Stairs-Rails: Right Home Layout: Two level, Laundry or work area in basement(does not use the basement) Alternate Level Stairs-Number of Steps: Pt says she has a basement but does not use it. Bathroom Shower/Tub: Multimedia programmer: Handicapped height Bathroom Accessibility: Yes Home Equipment: Environmental consultant - 2 wheels, Cane - single point, Wheelchair - manual, Bedside commode, Shower seat  Lives With: Alone   Functional History: Prior Function Level of Independence: Independent with assistive device(s)(Mod I with RW; declining function each admisison) Gait / Transfers Assistance Needed: Mod I with RW Comments: mod I with adls/iadls.  Sleeps in recliner  Functional Status:  Mobility: Bed Mobility Overal bed mobility: Needs Assistance Bed Mobility: Supine to Sit, Sit to Supine Rolling: Mod assist Sidelying to sit: Mod assist Supine to sit: Mod assist Sit to supine: Min assist(HOB raised; used rail, modified roll) General bed mobility comments: Assist for legs and trunk Transfers Overall transfer level: Needs assistance Equipment used: Rolling walker (2 wheeled) Transfers: Sit to/from Stand Sit to Stand: Mod assist, Min assist, From elevated surface General transfer comment: NT 2* pt c/o increasing dizziness and noted fall in BP Ambulation/Gait Ambulation/Gait assistance: Min assist Gait Distance (Feet): 3 Feet Assistive device: Rolling walker (2 wheeled) Gait Pattern/deviations: Decreased step length - right, Decreased step length -  left General Gait Details: Pt took 3-4 small steps infront of bed to chair with RW.    ADL: ADL Overall ADL's : Needs assistance/impaired Eating/Feeding: Set up Grooming: Oral care, Brushing hair, Sitting, Set up Upper Body Bathing: Minimal assistance Lower Body Bathing: Maximal assistance Upper Body Dressing : Minimal assistance Lower Body Dressing: Total assistance Toilet Transfer: Minimal assistance, Stand-pivot, RW(chair from high bed) General ADL Comments: pt is familiar with AE and has this at home.  Based on clinical judgment, now min A for LB adls.  Still mod/max A for toileting  Cognition: Cognition Overall Cognitive Status: Within Functional Limits for tasks assessed Orientation Level: Oriented X4 Cognition Arousal/Alertness: Awake/alert Behavior During Therapy: WFL for tasks assessed/performed Overall Cognitive Status: Within Functional Limits  for tasks assessed General Comments: Limited by weakness and pain, increased time to complete.  Also c/o dizziness following transfer to chair/bed   Blood pressure 116/76, pulse 68, temperature 98.4 F (36.9 C), temperature source Oral, resp. rate 18, height 5\' 4"  (1.626 m), weight 67.3 kg, SpO2 97 %. Physical Exam  Nursing note and vitals reviewed. Constitutional: She is oriented to person, place, and time. She appears well-developed. No distress.  Obese  HENT:  Head: Normocephalic and atraumatic.  Eyes: EOM are normal. Right eye exhibits no discharge. Left eye exhibits no discharge.  Respiratory: Effort normal. No stridor. No respiratory distress.  GI: Soft. Bowel sounds are normal. She exhibits no distension.  Genitourinary:    Genitourinary Comments: Right flank with nephrostomy tube draining clear urine.    Musculoskeletal:        General: Edema present. No tenderness.  Neurological: She is alert and oriented to person, place, and time.  Motor: Bilateral upper extremities: 5/5 proximal distal Bilateral lower  extremities: Hip flexion, knee extension 2/5, ankle dorsiflexion 4+/5 (left stronger than right)  Skin: Skin is warm and dry. No rash noted. She is not diaphoretic. No erythema.  Urostomy tube site C/D/I  Psychiatric: She has a normal mood and affect. Her behavior is normal.    Results for orders placed or performed during the hospital encounter of 04/09/19 (from the past 48 hour(s))  Glucose, capillary     Status: Abnormal   Collection Time: 04/20/19  4:35 PM  Result Value Ref Range   Glucose-Capillary 131 (H) 70 - 99 mg/dL  Glucose, capillary     Status: Abnormal   Collection Time: 04/20/19  9:28 PM  Result Value Ref Range   Glucose-Capillary 126 (H) 70 - 99 mg/dL  Renal function panel     Status: Abnormal   Collection Time: 04/21/19  5:29 AM  Result Value Ref Range   Sodium 132 (L) 135 - 145 mmol/L   Potassium 5.0 3.5 - 5.1 mmol/L   Chloride 97 (L) 98 - 111 mmol/L   CO2 24 22 - 32 mmol/L   Glucose, Bld 98 70 - 99 mg/dL   BUN 70 (H) 8 - 23 mg/dL   Creatinine, Ser 4.63 (H) 0.44 - 1.00 mg/dL   Calcium 8.9 8.9 - 10.3 mg/dL   Phosphorus 3.7 2.5 - 4.6 mg/dL   Albumin 2.0 (L) 3.5 - 5.0 g/dL   GFR calc non Af Amer 9 (L) >60 mL/min   GFR calc Af Amer 10 (L) >60 mL/min   Anion gap 11 5 - 15    Comment: Performed at [facility], [place] [address], [place], [place] [address]  Glucose, capillary     Status: None   Collection Time: 04/21/19  7:29 AM  Result Value Ref Range   Glucose-Capillary 95 70 - 99 mg/dL  Glucose, capillary     Status: Abnormal   Collection Time: 04/21/19 11:50 AM  Result Value Ref Range   Glucose-Capillary 120 (H) 70 - 99 mg/dL   No results found.     Medical Problem List and Plan: 1.  Functional deficits secondary to debility and pain.   Begin CIR evaluations 2.  Antithrombotics: -DVT/anticoagulation:  Mechanical: Sequential compression devices, below knee Bilateral lower extremities  -antiplatelet therapy: N/A 3. Multilevel lumbar  spondylosis/Pain Management: Oxycodone/tylenol prn 4. Mood: LCSW to follow for evaluation and support.   -antipsychotic agents: N/A 5. Neuropsych: This patient is capable of making decisions on her own behalf. 6. Skin/Wound Care:  routine pressure relief measures.  7. Fluids/Electrolytes/Nutrition: Strict I/O. 1200 cc FR. added prostat to help with nutritional status. Continue Ensure bid.   8. Chronic interstitial cystitis/Right hydronephrosis: Continue Enablex with oxycodone prn --to avoid Myrbetriq due to CKD.  9. CKD: continue NaHCO3.  Hold Lasix at this time.  Monitor weights daily.  Monitor for signs and symptoms of fluid overload.   10. T2DM: Controlled with diet--will monitor BS ac/hs and use SSI prn for elevated BS. Family bringing food from home.   Monitor with increased mobility 11.  Hypotension: Ordered TEDs. Encourage OOB to help build endurance.   Check orthostatics when able-discussed with nursing unable to check due to safety concerns. 12. Anemia of chronic disease: H/o heme positive stools and has declined GI follow up.   Repeat CBC ordered. 13. Thrombocytopenia: DC'd heparin and monitor for recovery. Monitor for signs of bleeding.    HIT panel.  CBC ordered 14. Constipation: Increased Miralax to bid.  Added Colace per patient request.  Received an enema on 5/20 15. Neurogenic bladder: Question urinary retention due to reports of difficulty voiding as well as urge to urinate with abdominal exam.  Ordered bladder scans/ PVRs to monitor voiding function. Constipation and narcotics likely playing a part in voiding difficulties.   Bary Leriche, PA-C 04/22/2019

## 2019-04-21 NOTE — Care Management Important Message (Signed)
Important Message  Patient Details IM Letter given Dessa Phi RN Case Manager to present to the Patient Name: Erika Fry MRN: [id number] Date of Birth: [birth year]   Medicare Important Message Given:  Yes    Kerin Salen 04/21/2019, 11:54 AMImportant Message  Patient Details  Name: Erika Fry MRN: [id number] Date of Birth: 1946/04/26   Medicare Important Message Given:  Yes    Kerin Salen 04/21/2019, 11:51 AM

## 2019-04-21 NOTE — Progress Notes (Signed)
Jamse Arn, MD  Physician  Physical Medicine and Rehabilitation  PMR Pre-admission  Signed  Date of Service:  04/21/2019 9:33 AM       Related encounter: Admission (Current) from IR 60MIN MCIR1/WLIR1/GI315 IR from 04/09/2019 in [facility]         Show:Clear all [x] Manual[x] Template[] Copied  Added by: [x] Cristina Gong, RN[x] Jamse Arn, MD  [] Hover for details PMR Admission Coordinator Pre-Admission Assessment  Patient: Erika Fry is an 73 y.o., female MRN: [id number] DOB: [birth year] Height: 5' 4"  (162.6 cm) Weight: 67.3 kg  Insurance Information HMO: yes    PPO:      PCP:      IPA:      80/20:      OTHER: medicare advantage PRIMARY: [employer] Medicare      Policy#: [id number]      Subscriber: pt CM Name: Elisabeth Pigeon      Phone#: [phone]     Fax#: [phone] Pre-Cert#: [id number] for 3 days  With f/u [place] phone [phone] ext [phone] fax [phone] on 5/22     Employer: none Benefits:  Phone #: [phone]     Name: 5/19 portal Eff. Date: 12/02/2018     Deduct: none      Out of Pocket Max: $4500/met $6948.54      Life Max: none CIR: $325 co pay per day days 1 until 5      SNF: no co pay days 1 until 20; $160 co pay per day days 21 until 49; no co pay days 50 until 100 Outpatient: $35 co pay per visit     Co-Pay: visits per medical neccesity Home Health: 100%      Co-Pay: visits per medical neccesity DME: 80%     Co-Pay: 20% Providers: in network  SECONDARY: none        Medicaid Application Date:       Case Manager:  Disability Application Date:       Case Worker:   The "Data Collection Information Summary" for patients in Inpatient Rehabilitation Facilities with attached "Privacy Act [place] Records" was provided and verbally reviewed with: Patient  Emergency Contact Information         Contact Information    Name Relation Home Work Mobile    Dickman,Jennifer Relative [phone]  [phone]   Sanela, Evola [phone]  [phone]   Saranne, Crislip [phone]  [phone]   Hellon, Vaccarella [phone]  [phone]      Current Medical History  Patient Admitting Diagnosis: Debility  History of Present Illness: 73 year old female with history of chronic interstitial cystitis, chronic pelvic pain, history of lumbar discitis treated with prolonged anitibiotics, chronic kidney disease due to HTN, stage IV with baseline creat about 3 to 3.6, Type 2 diabetes on insulin, complete heart block post dual chamber pacemaker, diastolic heart failure, hypothyroidism and anemia presented from interventional radiology on 04/09/2019.   Was at IR for right nephrostomy tube placement and found to have acute kidney injury as well as severe persistent pain so admitted to hospital. Obstruction due to interstitial cystitis and scarring. Nephrostomy tube draining well. AKI treated with bicarbonate infusion. Renal function fluctuating . Will have outpatient follow up with nephrology.  Febrile episode with no focus of infection found. Was treated with Meropenem 5/14 until 5/16 and now discontinued.  Intractable back pain treated with multiple agents including oxycodone, oxybutynin and darifenacin. Palliative care consulted to  assist with pain management. MRI without contrast completed that showed mostly chronic changes. No acute infection or cord compression. Patient with history of lumbar discitis and significant pelvic pain. Patient was receiving gabapentin but patient had weaned herself off as an outpatient and requested med be discontinued.   Patient's medical record from [facility] has been reviewed by the rehabilitation admission coordinator and physician.  Past Medical History      Past Medical History:  Diagnosis Date  . Arthritis    "knees, thumbs" (03/25/2018)  . CKD (chronic kidney disease), stage  IV ([place])   . Diet-controlled diabetes mellitus ([place])   . GERD (gastroesophageal reflux disease)   . Gout    "on daily RX" (03/25/2018)  . Heart murmur   . High cholesterol   . Hypertension   . Hypothyroidism   . Iron deficiency anemia    "had to get an iron infusion"  . Migraine    "used to have them growing up" (03/25/2018)  . Presence of permanent cardiac pacemaker 03/25/2018    Family History   family history includes Diabetes Mellitus II in her brother and father; Gastric cancer in her brother; Heart attack in her father; Heart disease in her father; Hypertension in her mother; Stomach cancer in her brother.  Prior Rehab/Hospitalizations Has the patient had prior rehab or hospitalizations prior to admission? Yes   05/2018 [facility] 05/2018 CIR Dr Posey Pronto 32 days and then d/c home with sons and daughter in [place] proving 24/7 assist and [facility]  Has the patient had major surgery during 100 days prior to admission? Yes             Current Medications  Current Facility-Administered Medications:  .  acetaminophen (TYLENOL) tablet 650 mg, 650 mg, Oral, Q6H PRN, 650 mg at 04/20/19 0757 **OR** acetaminophen (TYLENOL) suppository 650 mg, 650 mg, Rectal, Q6H PRN, Arrien, Jimmy Picket, MD, 650 mg at 04/15/19 0537 .  darifenacin (ENABLEX) 24 hr tablet 7.5 mg, 7.5 mg, Oral, Daily, Arrien, Jimmy Picket, MD, 7.5 mg at 04/20/19 0940 .  feeding supplement (ENSURE ENLIVE) (ENSURE ENLIVE) liquid 237 mL, 237 mL, Oral, BID BM, Barb Merino, MD, 237 mL at 04/20/19 0941 .  fentaNYL (SUBLIMAZE) injection 12.5 mcg, 12.5 mcg, Intravenous, Q2H PRN, Loistine Chance, MD, 12.5 mcg at 04/20/19 0533 .  ferrous sulfate tablet 325 mg, 325 mg, Oral, BID WC, Arrien, Jimmy Picket, MD, 325 mg at 04/21/19 0733 .  folic acid (FOLVITE) tablet 1 mg, 1 mg, Oral, Daily, Arrien, Jimmy Picket, MD, 1 mg at 04/20/19 0941 .  heparin injection 5,000 Units, 5,000 Units, Subcutaneous, Q8H, Barb Merino, MD, 5,000 Units at 04/21/19 0548 .  insulin aspart (novoLOG) injection 0-9 Units, 0-9 Units, Subcutaneous, TID WC, Arrien, Jimmy Picket, MD, 1 Units at 04/20/19 1714 .  levothyroxine (SYNTHROID) tablet 75 mcg, 75 mcg, Oral, QAC breakfast, Arrien, Jimmy Picket, MD, 75 mcg at 04/21/19 0548 .  multivitamin with minerals tablet 1 tablet, 1 tablet, Oral, Daily, Barb Merino, MD, 1 tablet at 04/20/19 0940 .  Muscle Rub CREA, , Topical, Q4H PRN, Barb Merino, MD .  ondansetron (ZOFRAN) tablet 4 mg, 4 mg, Oral, Q6H PRN, 4 mg at 04/18/19 1340 **OR** ondansetron (ZOFRAN) injection 4 mg, 4 mg, Intravenous, Q6H PRN, Arrien, Jimmy Picket, MD, 4 mg at 04/09/19 1603 .  oxyCODONE (Oxy IR/ROXICODONE) immediate release tablet 5 mg, 5 mg, Oral, Q4H PRN, Loistine Chance, MD, 5 mg at 04/21/19 0726 .  pantoprazole (PROTONIX) EC tablet  40 mg, 40 mg, Oral, BID, Arrien, Jimmy Picket, MD, 40 mg at 04/20/19 2132 .  phenol (CHLORASEPTIC) mouth spray 1 spray, 1 spray, Mouth/Throat, PRN, Arrien, Jimmy Picket, MD, 1 spray at 04/10/19 2229 .  polyethylene glycol (MIRALAX / GLYCOLAX) packet 17 g, 17 g, Oral, Daily, Barb Merino, MD, 17 g at 04/20/19 1209 .  simvastatin (ZOCOR) tablet 40 mg, 40 mg, Oral, QHS, Arrien, Jimmy Picket, MD, 40 mg at 04/20/19 2132 .  vitamin B-12 (CYANOCOBALAMIN) tablet 1,000 mcg, 1,000 mcg, Oral, Daily, Arrien, Jimmy Picket, MD, 1,000 mcg at 04/20/19 0940  Patients Current Diet:     Diet Order                  Diet heart healthy/carb modified Room service appropriate? Yes; Fluid consistency: Thin; Fluid restriction: 1200 mL Fluid  Diet effective now               Precautions / Restrictions Precautions Precautions: Fall Precaution Comments: R neph tube Restrictions Weight Bearing Restrictions: No   Has the patient had 2 or more falls or a fall with injury in the past year? No  Prior Activity Level Limited Community (1-2x/wk): Frequent  hospitalizations since 10/2018  Prior Functional Level Self Care: Did the patient need help bathing, dressing, using the toilet or eating? Independent  Indoor Mobility: Did the patient need assistance with walking from room to room (with or without device)? Independent  Stairs: Did the patient need assistance with internal or external stairs (with or without device)? Needed some help  Functional Cognition: Did the patient need help planning regular tasks such as shopping or remembering to take medications? [place] / Equipment Home Assistive Devices/Equipment: Wheelchair, Environmental consultant (specify type), Kasandra Knudsen (specify quad or straight), Eyeglasses Home Equipment: Environmental consultant - 2 wheels, Cane - single point, Wheelchair - manual, Bedside commode, Shower seat  Prior Device Use: Indicate devices/aids used by the patient prior to current illness, exacerbation or injury? Walker  Current Functional Level Cognition  Overall Cognitive Status: Within Functional Limits for tasks assessed Orientation Level: Oriented X4 General Comments: Limited by weakness and pain, increased time to complete.  Also c/o dizziness following transfer to chair/bed    Extremity Assessment (includes Sensation/Coordination)  Upper Extremity Assessment: Generalized weakness  Lower Extremity Assessment: Generalized weakness    ADLs  Overall ADL's : Needs assistance/impaired Eating/Feeding: Set up Grooming: Oral care, Minimal assistance, Sitting Upper Body Bathing: Minimal assistance Lower Body Bathing: Maximal assistance Upper Body Dressing : Minimal assistance Lower Body Dressing: Total assistance General ADL Comments: sat EOB and performed grooming tasks.      Mobility  Overal bed mobility: Needs Assistance Bed Mobility: Supine to Sit, Sit to Supine Rolling: Mod assist Sidelying to sit: Mod assist Supine to sit: Mod assist Sit to supine: Mod assist, Max assist, +2 for physical  assistance General bed mobility comments: Assist for legs and trunk    Transfers  Overall transfer level: Needs assistance Equipment used: Rolling walker (2 wheeled) Transfers: Sit to/from Stand Sit to Stand: Mod assist, Min assist, From elevated surface General transfer comment: NT 2* pt c/o increasing dizziness and noted fall in BP    Ambulation / Gait / Stairs / Wheelchair Mobility  Ambulation/Gait Ambulation/Gait assistance: Herbalist (Feet): 3 Feet Assistive device: Rolling walker (2 wheeled) Gait Pattern/deviations: Decreased step length - right, Decreased step length - left General Gait Details: Pt took 3-4 small steps infront of bed to chair with RW.  Posture / Balance Balance Overall balance assessment: Needs assistance Sitting-balance support: Bilateral upper extremity supported Sitting balance-Leahy Scale: Poor Postural control: Right lateral lean Standing balance support: Bilateral upper extremity supported Standing balance-Leahy Scale: Poor    Special needs/care consideration BiPAP/CPAP  N/a CPM  N/a Continuous Drip IV  N/a Dialysis n/a Life Vest  N/a Oxygen  N/a Special Bed  N/a Trach Size  N/a Wound Vac n/a Skin ecchymosis to flank and buttocks and labia Bowel mgmt:  continent LBM 5/19 Bladder mgmt: Right nephrostomy and external catheter Diabetic mgmt:  yes Behavioral consideration  N/a Chemo/radiation  N/a   Previous Home Environment  Living Arrangements: Alone  Lives With: Alone Available Help at Discharge: Family, Available 24 hours/day(John will arrange 24/7 assist with family as needed) Type of Home: House Home Layout: Two level, Laundry or work area in basement(does not use the basement) Alternate Level Stairs-Number of Steps: Pt says she has a basement but does not use it. Home Access: Stairs to enter Entrance Stairs-Rails: Right Entrance Stairs-Number of Steps: 1 with no railing in front, back is 2 steps with L railing   Bathroom Shower/Tub: Multimedia programmer: Handicapped height Bathroom Accessibility: Yes How Accessible: Accessible via walker [place]: Yes Type of Home Care Services: [place] (if known): [facility]  Discharge Living Setting Plans for Discharge Living Setting: Patient's home, Alone Type of Home at Discharge: [place]: Two level, Laundry or work area in basement Discharge Home Access: Stairs to enter Entrance Stairs-Rails: Right Entrance Stairs-Number of Steps: 1 Discharge Bathroom Shower/Tub: Walk-in shower Discharge Bathroom Toilet: Handicapped height Discharge Bathroom Accessibility: Yes How Accessible: Accessible via walker Does the patient have any problems obtaining your medications?: No  Social/Family/Support Systems Patient Roles: Parent Contact Information: Jenny Reichmann, son is main contact to arrange family Anticipated Caregiver: sons and thier wifes Anticipated Caregiver's Contact Information: Jenny Reichmann [phone] Ability/Limitations of Caregiver: Jenny Reichmann can arrange 24/7 assist Caregiver Availability: 24/7 Discharge Plan Discussed with Primary Caregiver: Yes Is Caregiver In Agreement with Plan?: Yes Does Caregiver/Family have Issues with Lodging/Transportation while Pt is in Rehab?: No  Goals/Additional Needs Patient/Family Goal for Rehab: supervision to min asisst with PT and OT Expected length of stay: ELOS 10 to 14 days Pt/Family Agrees to Admission and willing to participate: Yes Program Orientation Provided & Reviewed with Pt/Caregiver Including Roles  & Responsibilities: Yes  Decrease burden of Care through IP rehab admission: n/a  Possible need for SNF placement upon discharge:  Patient refusing  Patient Condition: I have reviewed medical records from [facility]  spoken with patient, and son. I discussed via phone for inpatient rehabilitation assessment.  Patient will benefit from ongoing  PT and OT, can actively participate in 3 hours of therapy a day 5 days of the week, and can make measurable gains during the admission.  Patient will also benefit from the coordinated team approach during an Inpatient Acute Rehabilitation admission.  The patient will receive intensive therapy as well as Rehabilitation physician, nursing, social worker, and care management interventions.  Due to bladder management, bowel management, safety, skin/wound care, disease management, medication administration, pain management and patient education the patient requires 24 hour a day rehabilitation nursing.  The patient is currently mod assist with mobility and basic ADLs.  Discharge setting and therapy post discharge at home with home health is anticipated.  Patient has agreed to participate in the Acute Inpatient Rehabilitation Program and will admit today.  Preadmission Screen  Completed By:  Cleatrice Burke RN MSN, 04/21/2019 10:30 AM ______________________________________________________________________   Discussed status with Dr. Posey Pronto  on  04/2019 at 1050 and received approval for admission today.  Admission Coordinator:  Cleatrice Burke, RN MSN  Time 1050 Date  04/21/2019   Assessment/Plan: Diagnosis: Debility  1. Does the need for close, 24 hr/day Medical supervision in concert with the patient's rehab needs make it unreasonable for this patient to be served in a less intensive setting? Potentially  2. Co-Morbidities requiring supervision/potential complications: chronic interstitial cystitis, chronic pelvic pain, history of lumbar discitis treated with prolonged anitibiotics, chronic kidney disease due to HTN, stage IV with baseline creat about 3 to 3.6, Type 2 diabetes on insulin 3. Due to bladder management, safety, skin/wound care, disease management, medication administration and patient education, does the patient require 24 hr/day rehab nursing? Potentially 4. Does the patient  require coordinated care of a physician, rehab nurse, PT (1-2 hrs/day, 5 days/week) and OT (1-2 hrs/day, 5 days/week) to address physical and functional deficits in the context of the above medical diagnosis(es)? Potentially Addressing deficits in the following areas: balance, endurance, locomotion, strength, transferring, bathing, dressing, toileting and psychosocial support 5. Can the patient actively participate in an intensive therapy program of at least 3 hrs of therapy 5 days a week? Yes 6. The potential for patient to make measurable gains while on inpatient rehab is good 7. Anticipated functional outcomes upon discharge from inpatients are: supervision and min assist PT, supervision and min assist OT, n/a SLP 8. Estimated rehab length of stay to reach the above functional goals is: 10-14 days. 9. Anticipated D/C setting: TBD 10. Anticipated post D/C treatments: HH therapy and Home excercise program 11. Overall Rehab/Functional Prognosis: good  MD Signature: Delice Lesch, MD, ABPMR        Revision History

## 2019-04-21 NOTE — Progress Notes (Signed)
Daily Progress Note   Patient Name: Erika Fry       Date: 04/21/2019 DOB: Mar 10, 1946  Age: 73 y.o. MRN#: [id number] Attending Physician: Dessa Phi, DO Primary Care Physician: [name], MD Admit Date: 04/09/2019  Reason for Consultation/Follow-up: Establishing goals of care and Pain control  Subjective: I saw and examined Erika Fry today.  She reports pain continues to be well-controlled today.  She reports going forward to transition to CIR to begin rehab.  She has been there in the past and found it very beneficial.  Discussed stopping gabapentin per her request.  She reports she feels fine without any concern for withdrawal.  Length of Stay: 11  Current Medications: Scheduled Meds:  . darifenacin  7.5 mg Oral Daily  . feeding supplement (ENSURE ENLIVE)  237 mL Oral BID BM  . ferrous sulfate  325 mg Oral BID WC  . folic acid  1 mg Oral Daily  . heparin  5,000 Units Subcutaneous Q8H  . insulin aspart  0-9 Units Subcutaneous TID WC  . levothyroxine  75 mcg Oral QAC breakfast  . multivitamin with minerals  1 tablet Oral Daily  . pantoprazole  40 mg Oral BID  . polyethylene glycol  17 g Oral Daily  . simvastatin  40 mg Oral QHS  . vitamin B-12  1,000 mcg Oral Daily    Continuous Infusions:   PRN Meds: acetaminophen **OR** acetaminophen, fentaNYL (SUBLIMAZE) injection, Muscle Rub, ondansetron **OR** ondansetron (ZOFRAN) IV, oxyCODONE, phenol  Physical Exam         General: Alert, awake, in no acute distress.  Sitting in chair eating lunch HEENT: No bruits, no goiter, no JVD Heart: Regular rate and rhythm. No murmur appreciated. Lungs: Good air movement, clear Abdomen: Soft, nontender, nondistended, positive bowel sounds.  Ext: No significant  edema Skin: Warm and dry Neuro: Grossly intact, nonfocal.  Vital Signs: BP (!) 96/59 (BP Location: Right Arm)   Pulse 67   Temp 98.2 F (36.8 C) (Oral)   Resp 16   Ht 5\' 4"  (1.626 m)   Wt 67.3 kg   SpO2 99%   BMI 25.47 kg/m  SpO2: SpO2: 99 % O2 Device: O2 Device: Room Air O2 Flow Rate: O2 Flow Rate (L/min): 2 L/min  Intake/output summary:   Intake/Output Summary (Last 24 hours) at  04/21/2019 1316 Last data filed at 04/21/2019 1100 Gross per 24 hour  Intake 1040 ml  Output 1710 ml  Net -670 ml   LBM: Last BM Date: 04/20/19 Baseline Weight: Weight: 67.3 kg Most recent weight: Weight: 67.3 kg       Palliative Assessment/Data:    Flowsheet Rows     Most Recent Value  Intake Tab  Referral Department  Hospitalist  Unit at Time of Referral  Med/Surg Unit  Palliative Care Primary Diagnosis  Nephrology  Date Notified  04/17/19  Palliative Care Type  New Palliative care  Reason for referral  Pain  Date of Admission  04/09/19  Date first seen by Palliative Care  04/17/19  # of days Palliative referral response time  0 Day(s)  # of days IP prior to Palliative referral  8  Clinical Assessment  Palliative Performance Scale Score  40%  Pain Max last 24 hours  4  Pain Min Last 24 hours  3  Dyspnea Max Last 24 Hours  4  Dyspnea Min Last 24 hours  3  Nausea Max Last 24 Hours  4  Nausea Min Last 24 Hours  3  Psychosocial & Spiritual Assessment  Palliative Care Outcomes  Patient/Family meeting held?  Yes  Who was at the meeting?  discussed with daughter in law over the phone.       Patient Active Problem List   Diagnosis Date Noted  . Acquired hydronephrosis with ureteropelvic junction (UPJ) obstruction   . Pain in joint involving pelvic region and thigh   . Dysuria   . Palliative care by specialist   . AKI (acute kidney injury) ([place]) 04/10/2019  . Hydronephrosis 04/09/2019  . Interstitial cystitis 04/09/2019  . Bilateral leg edema 02/25/2019  . Chronic heart  failure with preserved ejection fraction ([place]) 02/24/2019  . Cardiac pacemaker in situ 02/24/2019  . ARF (acute renal failure) ([place]) 02/10/2019  . Peripheral edema   . Fever 01/17/2019  . Acute lower UTI 01/17/2019  . Pressure injury of skin 01/17/2019  . Gross hematuria 01/14/2019  . Fluid retention   . Generalized edema   . Hypokalemia   . Acute right-sided low back pain with right-sided sciatica   . Acute blood loss anemia   . Anemia of chronic disease   . Thrombocytopenia ([place])   . Diabetes mellitus ([place])   . Morbid obesity ([place])   . Benign essential HTN   . Hypoalbuminemia due to protein-calorie malnutrition ([place])   . Occult blood in stools   . Gastric polyp   . Lumbar discitis 05/17/2018  . Blood loss anemia 05/12/2018  . Hyperlipidemia associated with type 2 diabetes mellitus ([place]) 05/10/2018  . Essential hypertension 04/29/2018  . CKD (chronic kidney disease) stage 4, GFR 15-29 ml/min (HCC)   . Diet-controlled diabetes mellitus ([place])   . GERD (gastroesophageal reflux disease)   . Gout   . High cholesterol   . Hypothyroidism   . Iron deficiency anemia   . AV block, Mobitz 2 03/25/2018  . Mobitz type 2 second degree AV block 03/20/2018  . Aortic stenosis 03/20/2018  . Exertional dyspnea 03/19/2018    Palliative Care Assessment & Plan   Patient Profile: 73 year old female with chronic pelvic pain, interstitial nephritis, CKD, DM, heart block admitted with intractible pain, AKI, hydronephrosis  Assessment: Patient Active Problem List   Diagnosis Date Noted  . Acquired hydronephrosis with ureteropelvic junction (UPJ) obstruction   . Pain in joint involving pelvic region and thigh   .  Dysuria   . Palliative care by specialist   . AKI (acute kidney injury) ([place]) 04/10/2019  . Hydronephrosis 04/09/2019  . Interstitial cystitis 04/09/2019  . Bilateral leg edema 02/25/2019  . Chronic heart failure with preserved ejection fraction ([place]) 02/24/2019  . Cardiac  pacemaker in situ 02/24/2019  . ARF (acute renal failure) ([place]) 02/10/2019  . Peripheral edema   . Fever 01/17/2019  . Acute lower UTI 01/17/2019  . Pressure injury of skin 01/17/2019  . Gross hematuria 01/14/2019  . Fluid retention   . Generalized edema   . Hypokalemia   . Acute right-sided low back pain with right-sided sciatica   . Acute blood loss anemia   . Anemia of chronic disease   . Thrombocytopenia ([place])   . Diabetes mellitus ([place])   . Morbid obesity ([place])   . Benign essential HTN   . Hypoalbuminemia due to protein-calorie malnutrition ([place])   . Occult blood in stools   . Gastric polyp   . Lumbar discitis 05/17/2018  . Blood loss anemia 05/12/2018  . Hyperlipidemia associated with type 2 diabetes mellitus ([place]) 05/10/2018  . Essential hypertension 04/29/2018  . CKD (chronic kidney disease) stage 4, GFR 15-29 ml/min (HCC)   . Diet-controlled diabetes mellitus ([place])   . GERD (gastroesophageal reflux disease)   . Gout   . High cholesterol   . Hypothyroidism   . Iron deficiency anemia   . AV block, Mobitz 2 03/25/2018  . Mobitz type 2 second degree AV block 03/20/2018  . Aortic stenosis 03/20/2018  . Exertional dyspnea 03/19/2018    Recommendations/Plan: Pain: Gabapentin discontinued yesterday per patient request.  Continue same pain regimen otherwise. Transition to CIR today.  Goals of Care and Additional Recommendations: Limitations on Scope of Treatment: Full Scope Treatment  Code Status:    Code Status Orders  (From admission, onward)         Start     Ordered   04/09/19 1534  Full code  Continuous     04/09/19 1534        Code Status History    Date Active Date Inactive Code Status Order ID Comments User Context   02/10/2019 2234 02/19/2019 1641 Full Code [id number]  Rise Patience, MD ED   01/17/2019 0054 01/29/2019 1900 Full Code [id number]  Phillips Grout, MD ED   05/22/2018 1524 06/22/2018 1726 Full Code [id number]  Bary Leriche, PA-C  Inpatient   05/12/2018 2314 05/22/2018 1458 Full Code [id number]  Bennie Pierini, MD ED   04/29/2018 1931 05/05/2018 1839 Full Code [id number]  Ivor Costa, MD ED   03/25/2018 1828 03/26/2018 1447 Full Code [id number]  Evans Lance, MD Inpatient      Prognosis:  > 12 months  Discharge Planning: To Be Determined  Care plan was discussed with patient, DIL  Thank you for allowing the Palliative Medicine Team to assist in the care of this patient.   Time In: 1200 Time Out: 1220 Total Time 20 Prolonged Time Billed No      Greater than 50%  of this time was spent counseling and coordinating care related to the above assessment and plan.  Micheline Rough, MD  Please contact Palliative Medicine Team phone at [phone] for questions and concerns.

## 2019-04-21 NOTE — TOC Transition Note (Signed)
Transition of Care [facility]) - CM/SW Discharge Note   Patient Details  Name: KATLYNNE MCKERCHER MRN: [id number] Date of Birth: 12-30-1945  Transition of Care [facility]) CM/SW Contact:  Dessa Phi, RN Phone Number: 04/21/2019, 11:22 AM   Clinical Narrative:   Accepted to CIR-rep Pamala Hurry has rm,bed-to coordinate with N[facility] for [facility] transport to [facility]. No further CM needs.    Final next level of care: IP Rehab Facility Barriers to Discharge: No Barriers Identified   Patient Goals and CMS Choice Patient states their goals for this hospitalization and ongoing recovery are:: go home CMS Medicare.gov Compare Post Acute Care list provided to:: Patient Choice offered to / list presented to : Patient  Discharge Placement                       Discharge Plan and Services   Discharge Planning Services: CM Consult                                 Social Determinants of Health (SDOH) Interventions     Readmission Risk Interventions Readmission Risk Prevention Plan 04/13/2019  Transportation Screening Complete  Medication Review Press photographer) Complete  PCP or Specialist appointment within 3-5 days of discharge Complete  HRI or [place] Complete  SW Recovery Care/Counseling Consult Complete  [place] Patient Refused  Some recent data might be hidden

## 2019-04-21 NOTE — Progress Notes (Signed)
Occupational Therapy Treatment Patient Details Name: Erika Fry MRN: [id number] DOB: 07-08-1946 Today's Date: 04/21/2019    History of present illness 73 yo female admitted with hydronephrosis s/p R nephrostomy tube/drain placement. Hx of neuropathy, DM, CKD, obesity, gout, OA, LE edema, osteopenia, venous insufficiency   OT comments  Pt is making good progress with OT. Plans transfer to CIR today.  Follow Up Recommendations  CIR    Equipment Recommendations  3 in 1 bedside commode    Recommendations for Other Services      Precautions / Restrictions Precautions Precautions: Fall Precaution Comments: R neph tube Restrictions Weight Bearing Restrictions: No       Mobility Bed Mobility           Sit to supine: Min assist(HOB raised; used rail, modified roll)      Transfers                      Balance                                           ADL either performed or assessed with clinical judgement   ADL       Grooming: Oral care;Brushing hair;Sitting;Set up                   Toilet Transfer: Minimal assistance;Stand-pivot;RW(chair from high bed)             General ADL Comments: pt is familiar with AE and has this at home.  Based on clinical judgment, now min A for LB bathing and mod for LB dressing. Still mod/max A for toileting     Vision       Perception     Praxis      Cognition Arousal/Alertness: Awake/alert Behavior During Therapy: WFL for tasks assessed/performed Overall Cognitive Status: Within Functional Limits for tasks assessed                                          Exercises     Shoulder Instructions       General Comments      Pertinent Vitals/ Pain       Pain Assessment: Faces Faces Pain Scale: Hurts little more Pain Location: bil knees Pain Descriptors / Indicators: Sore Pain Intervention(s): Limited activity within patient's tolerance;Monitored during  session;Premedicated before session;Repositioned  Home Living     Available Help at Discharge: Family;Available 24 hours/day(John will arrange 24/7 assist with family as needed) Type of Home: House       Home Layout: Two level;Laundry or work area in basement(does not use the basement)     Bathroom Shower/Tub: Occupational hygienist: Yes How Accessible: Accessible via walker        Lives With: Alone    Prior Functioning/Environment Level of Independence: Independent with assistive device(s)(Mod I with RW; declining function each admisison)  Gait / Transfers Assistance Needed: Mod I with RW         Frequency  Min 2X/week        Progress Toward Goals  OT Goals(current goals can now be found in the care plan section)  Progress towards OT goals: Progressing toward goals     Plan      Co-evaluation  AM-PAC OT "6 Clicks" Daily Activity     Outcome Measure   Help from another person eating meals?: None Help from another person taking care of personal grooming?: A Little Help from another person toileting, which includes using toliet, bedpan, or urinal?: A Lot Help from another person bathing (including washing, rinsing, drying)?: A Little Help from another person to put on and taking off regular upper body clothing?: A Little Help from another person to put on and taking off regular lower body clothing?: A Lot 6 Click Score: 17    End of Session    OT Visit Diagnosis: Muscle weakness (generalized) (M62.81)   Activity Tolerance Patient tolerated treatment well   Patient Left in chair;with call bell/phone within reach;with chair alarm set   Nurse Communication          Time: 8675-4492 OT Time Calculation (min): 19 min  Charges: OT General Charges $OT Visit: 1 Visit OT Treatments $Therapeutic Activity: 8-22 mins  Lesle Chris, OTR/L Acute Rehabilitation Services [phone] [facility] pager [phone]  office 04/21/2019   [place] 04/21/2019, 11:46 AM

## 2019-04-22 ENCOUNTER — Inpatient Hospital Stay (HOSPITAL_COMMUNITY): Payer: Medicare Other | Admitting: Physical Therapy

## 2019-04-22 ENCOUNTER — Inpatient Hospital Stay (HOSPITAL_COMMUNITY): Payer: Medicare Other | Admitting: Occupational Therapy

## 2019-04-22 DIAGNOSIS — N189 Chronic kidney disease, unspecified: Secondary | ICD-10-CM

## 2019-04-22 DIAGNOSIS — D696 Thrombocytopenia, unspecified: Secondary | ICD-10-CM

## 2019-04-22 DIAGNOSIS — N319 Neuromuscular dysfunction of bladder, unspecified: Secondary | ICD-10-CM

## 2019-04-22 DIAGNOSIS — D638 Anemia in other chronic diseases classified elsewhere: Secondary | ICD-10-CM

## 2019-04-22 DIAGNOSIS — N1339 Other hydronephrosis: Secondary | ICD-10-CM

## 2019-04-22 DIAGNOSIS — I959 Hypotension, unspecified: Secondary | ICD-10-CM

## 2019-04-22 DIAGNOSIS — K5903 Drug induced constipation: Secondary | ICD-10-CM

## 2019-04-22 DIAGNOSIS — R5381 Other malaise: Principal | ICD-10-CM

## 2019-04-22 LAB — COMPREHENSIVE METABOLIC PANEL
ALT: 10 U/L (ref 0–44)
AST: 14 U/L — ABNORMAL LOW (ref 15–41)
Albumin: 1.9 g/dL — ABNORMAL LOW (ref 3.5–5.0)
Alkaline Phosphatase: 84 U/L (ref 38–126)
Anion gap: 10 (ref 5–15)
BUN: 69 mg/dL — ABNORMAL HIGH (ref 8–23)
CO2: 23 mmol/L (ref 22–32)
Calcium: 8.8 mg/dL — ABNORMAL LOW (ref 8.9–10.3)
Chloride: 99 mmol/L (ref 98–111)
Creatinine, Ser: 4.9 mg/dL — ABNORMAL HIGH (ref 0.44–1.00)
GFR calc Af Amer: 9 mL/min — ABNORMAL LOW (ref >60)
GFR calc non Af Amer: 8 mL/min — ABNORMAL LOW (ref >60)
Glucose, Bld: 118 mg/dL — ABNORMAL HIGH (ref 70–99)
Potassium: 4.4 mmol/L (ref 3.5–5.1)
Sodium: 132 mmol/L — ABNORMAL LOW (ref 135–145)
Total Bilirubin: 0.5 mg/dL (ref 0.3–1.2)
Total Protein: 4.6 g/dL — ABNORMAL LOW (ref 6.5–8.1)

## 2019-04-22 LAB — CBC WITH DIFFERENTIAL/PLATELET
Abs Immature Granulocytes: 0.05 10*3/uL (ref 0.00–0.07)
Basophils Absolute: 0 10*3/uL (ref 0.0–0.1)
Basophils Relative: 1 %
Eosinophils Absolute: 0.1 10*3/uL (ref 0.0–0.5)
Eosinophils Relative: 1 %
HCT: 21.9 % — ABNORMAL LOW (ref 36.0–46.0)
Hemoglobin: 7 g/dL — ABNORMAL LOW (ref 12.0–15.0)
Immature Granulocytes: 1 %
Lymphocytes Relative: 14 %
Lymphs Abs: 1.1 10*3/uL (ref 0.7–4.0)
MCH: 28.6 pg (ref 26.0–34.0)
MCHC: 32 g/dL (ref 30.0–36.0)
MCV: 89.4 fL (ref 80.0–100.0)
Monocytes Absolute: 0.7 10*3/uL (ref 0.1–1.0)
Monocytes Relative: 10 %
Neutro Abs: 5.6 10*3/uL (ref 1.7–7.7)
Neutrophils Relative %: 73 %
Platelets: 245 10*3/uL (ref 150–400)
RBC: 2.45 MIL/uL — ABNORMAL LOW (ref 3.87–5.11)
RDW: 15.3 % (ref 11.5–15.5)
WBC: 7.6 10*3/uL (ref 4.0–10.5)
nRBC: 0 % (ref 0.0–0.2)

## 2019-04-22 LAB — GLUCOSE, CAPILLARY
Glucose-Capillary: 109 mg/dL — ABNORMAL HIGH (ref 70–99)
Glucose-Capillary: 175 mg/dL — ABNORMAL HIGH (ref 70–99)
Glucose-Capillary: 123 mg/dL — ABNORMAL HIGH (ref 70–99)
Glucose-Capillary: 114 mg/dL — ABNORMAL HIGH (ref 70–99)

## 2019-04-22 MED ORDER — ENSURE ENLIVE PO LIQD
237.0000 mL | Freq: Two times a day (BID) | ORAL | Status: DC
  Administered 2019-04-22 – 2019-04-30 (×15): 237 mL via ORAL

## 2019-04-22 MED ORDER — MILK AND MOLASSES ENEMA
1.0000 | Freq: Every day | RECTAL | Status: DC | PRN
  Filled 2019-04-22: qty 240

## 2019-04-22 MED ORDER — GERHARDT'S BUTT CREAM
TOPICAL_CREAM | Freq: Three times a day (TID) | CUTANEOUS | Status: DC
  Administered 2019-04-22 – 2019-04-30 (×25): via TOPICAL
  Filled 2019-04-22: qty 1

## 2019-04-22 MED ORDER — ENOXAPARIN SODIUM 30 MG/0.3ML ~~LOC~~ SOLN
30.0000 mg | SUBCUTANEOUS | Status: DC
  Administered 2019-04-22 – 2019-04-30 (×9): 30 mg via SUBCUTANEOUS
  Filled 2019-04-22 (×9): qty 0.3

## 2019-04-22 NOTE — Progress Notes (Signed)
Occupational Therapy Session Note  Patient Details  Name: Erika Fry MRN: [id number] Date of Birth: [birth year]  Today's Date: 04/22/2019 OT Individual Time: 3212-2482 OT Individual Time Calculation (min): 42 min    Short Term Goals: Week 1:  OT Short Term Goal 1 (Week 1): STG=LTG 2/2 ELOS  Skilled Therapeutic Interventions/Progress Updates:  Upon entering the room, pt supine in bed and reports stomach pain and urge to use bathroom. Sit >stand from bed with min A and pt transferred onto [facility] with min A and use of RW. Pt needing assistance for clothing management. Pt with increased pain and straining to have small BM. OT encouraged pt to considered enema after therapy sessions in order to evacuate bowels. RN notified as well. Pt standing with min A and maintaining balance while OT assisted with hygiene. Pt returning back to bed secondary to fatigue. Sit >supine with mod A for B LEs. Bed alarm activated and call bell within reach upon exiting the room.   Therapy Documentation Precautions:  Precautions Precautions: Fall Precaution Comments: R neph tube Restrictions Weight Bearing Restrictions: No General: General PT Missed Treatment Reason: Nursing care Vital Signs:  Pain: Pain Assessment Pain Scale: 0-10 Pain Score: 0-No pain ADL: ADL Grooming: Supervision/safety Upper Body Bathing: Setup Lower Body Bathing: Moderate assistance Upper Body Dressing: Contact guard Lower Body Dressing: Moderate assistance Toileting: Moderate assistance Toilet Transfer: Minimal assistance Toilet Transfer Method: Stand pivot Tub/Shower Transfer: Unable to assess Gaffer Transfer: Unable to assess Vision Baseline Vision/History: Wears glasses Wears Glasses: At all times Patient Visual Report: No change from baseline Vision Assessment?: No apparent visual deficits Perception  Perception: Within Functional Limits Praxis Praxis: Intact Exercises:   Other Treatments:      Therapy/Group: Individual Therapy  Gypsy Decant 04/22/2019, 1:14 PM

## 2019-04-22 NOTE — H&P (Signed)
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  Patient lives alone.  Postop she developed severe pain.  She was admitted for IV morphine/pain management. She continued to be limited by bladder/suprapubic pain and urology consulted for input. She has tried multiple treatment with minimal improvement in symptoms. Analgesia as well lumbar MRI recommended due rule out neurological cause of pain. She developed a fever on 04/15/2019 and she was started on meropenum -- BC/UCS negative therefore antibiotics d/c.    Acute on chronic renal failure with metabolic acidosis treated with bicarb and fluids with some improvement. Dr. Royce Macadamia consulted and recommended discontinuation of IVF and Bicarb past 1 L as well as monitoring closely for fluid overload and UOP.   Palliative care consulted to discuss [place] and pain management--patient elected on full scope of care. She was opposed to using gabapentin and pain control improving with use of IV fentanyl.   MRI  Lumbar spine done changes due to chronic L4/5 diskitis with lumbar spondylosis --no evidence of persistent or new infection and improvement in right hydronphrosis. Patient has had gradual functional decline since last fall and pending HD in the near future.  She lives alone and has had significant decline in the past few months with multiple admissions.  Therapy ongoing and patient limited by pain and dizziness. She has declined SNF and CIR recommended due to debility.    Review of Systems  Constitutional: Positive for malaise/fatigue. Negative for chills and fever.  HENT: Negative for hearing loss, sore throat and tinnitus.   Eyes: Negative for  blurred vision and double vision.  Respiratory: Negative for cough, sputum production and shortness of breath.   Cardiovascular: Positive for leg swelling. Negative for chest pain and palpitations.  Gastrointestinal: Positive for constipation. Negative for abdominal pain, heartburn and nausea.       Has been straying to have BM without results.   Genitourinary: Positive for frequency and urgency.       Can't urinate without Pure wick.  Musculoskeletal: Positive for myalgias. Negative for back pain.  Skin: Negative for rash.  Neurological: Positive for weakness. Negative for dizziness and headaches.  Psychiatric/Behavioral: The patient does not have insomnia.   All other systems reviewed and are negative.    Past Medical History:  Diagnosis Date  . Arthritis    "knees, thumbs" (03/25/2018)  . CKD (chronic kidney disease), stage IV ([place])   . Diet-controlled diabetes mellitus ([place])   . GERD (gastroesophageal reflux disease)   . Gout    "on daily RX" (03/25/2018)  . Heart murmur   . High cholesterol   . Hypertension   . Hypothyroidism   . Iron deficiency anemia    "had to get an iron infusion"  . Migraine    "used to have them growing up" (03/25/2018)  . Presence of permanent cardiac pacemaker 03/25/2018    Past Surgical History:  Procedure Laterality Date  . APPENDECTOMY    . BIOPSY  05/19/2018   Procedure: BIOPSY;  Surgeon: Ladene Artist, MD;  Location: [place];  Service: Endoscopy;;  . Consuela Mimes W/ RETROGRADES Bilateral 01/14/2019   Procedure: CYSTOSCOPY WITH RETROGRADE PYELOGRAM BILATERAL HYDRODISTENTION;  Surgeon: Ardis Hughs, MD;  Location: [facility];  Service: Urology;  Laterality: Bilateral;  . ESOPHAGOGASTRODUODENOSCOPY N/A 05/19/2018   Procedure: ESOPHAGOGASTRODUODENOSCOPY (EGD);  Surgeon: Ladene Artist, MD;  [facility];  Service: Endoscopy;  Laterality: N/A;  . ESOPHAGOGASTRODUODENOSCOPY (EGD) WITH PROPOFOL N/A 01/28/2019   Procedure:  ESOPHAGOGASTRODUODENOSCOPY (EGD) WITH PROPOFOL;  Surgeon: Rush Landmark Telford Nab., MD;  Location: [place];  Service: Gastroenterology;  Laterality: N/A;  . INSERT / REPLACE / REMOVE PACEMAKER  03/25/2018  . IR FLUORO GUIDE CV LINE RIGHT  05/18/2018  . IR LUMBAR DISC ASPIRATION W/IMG GUIDE  05/15/2018  . IR NEPHROSTOMY PLACEMENT RIGHT  04/09/2019  . IR REMOVAL TUN CV CATH W/O FL  07/23/2018  . IR US GUIDE VASC ACCESS RIGHT  05/18/2018  . KNEE ARTHROSCOPY Bilateral   . PACEMAKER IMPLANT N/A 03/25/2018   Procedure: PACEMAKER IMPLANT;  Surgeon: Evans Lance, MD;  Location: [place] CV LAB;  Service: Cardiovascular;  Laterality: N/A;  . PACEMAKER PLACEMENT Left 03/2018  . POLYPECTOMY  01/28/2019   Procedure: POLYPECTOMY;  Surgeon: Mansouraty, Telford Nab., MD;  Location: [place];  Service: Gastroenterology;;  . RIGHT HEART CATH N/A 03/20/2018   Procedure: RIGHT HEART CATH;  Surgeon: Nigel Mormon, MD;  Location: [place] CV LAB;  Service: Cardiovascular;  Laterality: N/A;  . TEE WITHOUT CARDIOVERSION N/A 03/20/2018   Procedure: TRANSESOPHAGEAL ECHOCARDIOGRAM (TEE);  Surgeon: Nigel Mormon, MD;  Location: [facility] ENDOSCOPY;  Service: Cardiovascular;  Laterality: N/A;  . TONSILLECTOMY    . URETEROSCOPY WITH HOLMIUM LASER LITHOTRIPSY Right 01/14/2019   Procedure: URETEROSCOPY WITH HOLMIUM LASER LITHOTRIPSY BLADDER BIOPSIES RIGHT STENT PLACEMENT;  Surgeon: Ardis Hughs, MD;  Location: [facility];  Service: Urology;  Laterality: Right;    Family History  Problem Relation Age of Onset  . Hypertension Mother   . Diabetes Mellitus II Father   . Heart disease Father   . Heart attack Father   . Gastric cancer Brother   . Diabetes Mellitus II Brother   . Stomach cancer Brother     Social History:  Lives alone and independent PTA. She reports that she has never smoked. She has never used smokeless tobacco. She reports previous alcohol use. She reports that she does not use  drugs.   Allergies  Allergen Reactions  . Baclofen Other (See Comments)    somnolence  . Codeine Other (See Comments)    Increases Pain and couldn't sleep    Medications Prior to Admission  Medication Sig Dispense Refill  . acetaminophen (TYLENOL) 325 MG tablet Take 650 mg by mouth as needed.    . ALOE VERA PO Take 3 capsules by mouth 2 (two) times a day.    . Ascorbic Acid (VITAMIN C PO) Take 1 tablet by mouth 2 (two) times daily.    . Biotin 5 MG CAPS Take 1 capsule (5 mg total) by mouth daily.  0  . cetirizine (ZYRTEC) 10 MG tablet Take 10 mg by mouth at bedtime.     . ferrous sulfate 325 (65 FE) MG tablet Take 1 tablet (325 mg total) by mouth 2 (two) times daily with a meal. 60 tablet 0  . fluticasone (FLONASE) 50 MCG/ACT nasal spray Place 2 sprays into both nostrils daily as needed for allergies or rhinitis.    . folic acid (FOLVITE) 1 MG tablet Take 1 tablet (1 mg total) by mouth daily. 30 tablet 0  . levothyroxine (SYNTHROID, LEVOTHROID) 75 MCG tablet Take 75 mcg by mouth daily before breakfast.    .  mirabegron ER (MYRBETRIQ) 50 MG TB24 tablet Take 50 mg by mouth every morning.     . Multiple Vitamin (MULTI-VITAMIN PO) Take 1 tablet by mouth daily.    [name] Kitchen oxyCODONE (OXY IR/ROXICODONE) 5 MG immediate release tablet Take 1 tablet (5 mg total) by mouth every 4 (four) hours as needed for moderate pain. 30 tablet 0  . pantoprazole (PROTONIX) 40 MG tablet Take 1 tablet (40 mg total) by mouth 2 (two) times daily for 30 days. (take once daily after 1 month) 60 tablet 0  . simvastatin (ZOCOR) 40 MG tablet Take 1 tablet (40 mg total) by mouth at bedtime.    . sodium bicarbonate 650 MG tablet Take 2 tablets (1,300 mg total) by mouth 2 (two) times daily. 60 tablet 0  . solifenacin (VESICARE) 5 MG tablet Take 5 mg by mouth every evening.     . vitamin B-12 (CYANOCOBALAMIN) 1000 MCG tablet Take 1 tablet (1,000 mcg total) by mouth daily. 30 tablet 0    Drug Regimen Review  Drug regimen was  reviewed and remains appropriate with no significant issues identified  Home: Home Living Family/patient expects to be discharged to:: Private residence Living Arrangements: Alone Available Help at Discharge: Family, Available 24 hours/day(John will arrange 24/7 assist with family as needed) Type of Home: House Home Access: Stairs to enter [employer] of Steps: 1 with no railing in front, back is 2 steps with L railing  Entrance Stairs-Rails: Right Home Layout: Two level, Laundry or work area in basement(does not use the basement) Alternate Level Stairs-Number of Steps: Pt says she has a basement but does not use it. Bathroom Shower/Tub: Multimedia programmer: Handicapped height Bathroom Accessibility: Yes Home Equipment: Environmental consultant - 2 wheels, Cane - single point, Wheelchair - manual, Bedside commode, Shower seat  Lives With: Alone   Functional History: Prior Function Level of Independence: Independent with assistive device(s)(Mod I with RW; declining function each admisison) Gait / Transfers Assistance Needed: Mod I with RW Comments: mod I with adls/iadls.  Sleeps in recliner  Functional Status:  Mobility: Bed Mobility Overal bed mobility: Needs Assistance Bed Mobility: Supine to Sit, Sit to Supine Rolling: Mod assist Sidelying to sit: Mod assist Supine to sit: Mod assist Sit to supine: Min assist(HOB raised; used rail, modified roll) General bed mobility comments: Assist for legs and trunk Transfers Overall transfer level: Needs assistance Equipment used: Rolling walker (2 wheeled) Transfers: Sit to/from Stand Sit to Stand: Mod assist, Min assist, From elevated surface General transfer comment: NT 2* pt c/o increasing dizziness and noted fall in BP Ambulation/Gait Ambulation/Gait assistance: Min assist Gait Distance (Feet): 3 Feet Assistive device: Rolling walker (2 wheeled) Gait Pattern/deviations: Decreased step length - right, Decreased step length -  left General Gait Details: Pt took 3-4 small steps infront of bed to chair with RW.    ADL: ADL Overall ADL's : Needs assistance/impaired Eating/Feeding: Set up Grooming: Oral care, Brushing hair, Sitting, Set up Upper Body Bathing: Minimal assistance Lower Body Bathing: Maximal assistance Upper Body Dressing : Minimal assistance Lower Body Dressing: Total assistance Toilet Transfer: Minimal assistance, Stand-pivot, RW(chair from high bed) General ADL Comments: pt is familiar with AE and has this at home.  Based on clinical judgment, now min A for LB adls.  Still mod/max A for toileting  Cognition: Cognition Overall Cognitive Status: Within Functional Limits for tasks assessed Orientation Level: Oriented X4 Cognition Arousal/Alertness: Awake/alert Behavior During Therapy: WFL for tasks assessed/performed Overall Cognitive Status: Within Functional Limits  for tasks assessed General Comments: Limited by weakness and pain, increased time to complete.  Also c/o dizziness following transfer to chair/bed   Blood pressure 116/76, pulse 68, temperature 98.4 F (36.9 C), temperature source Oral, resp. rate 18, height 5\' 4"  (1.626 m), weight 67.3 kg, SpO2 97 %. Physical Exam  Nursing note and vitals reviewed. Constitutional: She is oriented to person, place, and time. She appears well-developed. No distress.  Obese  HENT:  Head: Normocephalic and atraumatic.  Eyes: EOM are normal. Right eye exhibits no discharge. Left eye exhibits no discharge.  Respiratory: Effort normal. No stridor. No respiratory distress.  GI: Soft. Bowel sounds are normal. She exhibits no distension.  Genitourinary:    Genitourinary Comments: Right flank with nephrostomy tube draining clear urine.    Musculoskeletal:        General: Edema present. No tenderness.  Neurological: She is alert and oriented to person, place, and time.  Motor: Bilateral upper extremities: 5/5 proximal distal Bilateral lower  extremities: Hip flexion, knee extension 2/5, ankle dorsiflexion 4+/5 (left stronger than right)  Skin: Skin is warm and dry. No rash noted. She is not diaphoretic. No erythema.  Urostomy tube site C/D/I  Psychiatric: She has a normal mood and affect. Her behavior is normal.    Results for orders placed or performed during the hospital encounter of 04/09/19 (from the past 48 hour(s))  Glucose, capillary     Status: Abnormal   Collection Time: 04/20/19  4:35 PM  Result Value Ref Range   Glucose-Capillary 131 (H) 70 - 99 mg/dL  Glucose, capillary     Status: Abnormal   Collection Time: 04/20/19  9:28 PM  Result Value Ref Range   Glucose-Capillary 126 (H) 70 - 99 mg/dL  Renal function panel     Status: Abnormal   Collection Time: 04/21/19  5:29 AM  Result Value Ref Range   Sodium 132 (L) 135 - 145 mmol/L   Potassium 5.0 3.5 - 5.1 mmol/L   Chloride 97 (L) 98 - 111 mmol/L   CO2 24 22 - 32 mmol/L   Glucose, Bld 98 70 - 99 mg/dL   BUN 70 (H) 8 - 23 mg/dL   Creatinine, Ser 4.63 (H) 0.44 - 1.00 mg/dL   Calcium 8.9 8.9 - 10.3 mg/dL   Phosphorus 3.7 2.5 - 4.6 mg/dL   Albumin 2.0 (L) 3.5 - 5.0 g/dL   GFR calc non Af Amer 9 (L) >60 mL/min   GFR calc Af Amer 10 (L) >60 mL/min   Anion gap 11 5 - 15    Comment: Performed at [facility], [place] [address], [place], [place] [address]  Glucose, capillary     Status: None   Collection Time: 04/21/19  7:29 AM  Result Value Ref Range   Glucose-Capillary 95 70 - 99 mg/dL  Glucose, capillary     Status: Abnormal   Collection Time: 04/21/19 11:50 AM  Result Value Ref Range   Glucose-Capillary 120 (H) 70 - 99 mg/dL   No results found.     Medical Problem List and Plan: 1.  Functional deficits secondary to debility and pain.   Begin CIR evaluations 2.  Antithrombotics: -DVT/anticoagulation:  Mechanical: Sequential compression devices, below knee Bilateral lower extremities  -antiplatelet therapy: N/A 3. Multilevel lumbar  spondylosis/Pain Management: Oxycodone/tylenol prn 4. Mood: LCSW to follow for evaluation and support.   -antipsychotic agents: N/A 5. Neuropsych: This patient is capable of making decisions on her own behalf. 6. Skin/Wound Care:  routine pressure relief measures.  7. Fluids/Electrolytes/Nutrition: Strict I/O. 1200 cc FR. added prostat to help with nutritional status. Continue Ensure bid.   8. Chronic interstitial cystitis/Right hydronephrosis: Continue Enablex with oxycodone prn --to avoid Myrbetriq due to CKD.  9. CKD: continue NaHCO3.  Hold Lasix at this time.  Monitor weights daily.  Monitor for signs and symptoms of fluid overload.   10. T2DM: Controlled with diet--will monitor BS ac/hs and use SSI prn for elevated BS. Family bringing food from home.   Monitor with increased mobility 11.  Hypotension: Ordered TEDs. Encourage OOB to help build endurance.   Check orthostatics when able-discussed with nursing unable to check due to safety concerns. 12. Anemia of chronic disease: H/o heme positive stools and has declined GI follow up.   Repeat CBC ordered. 13. Thrombocytopenia: DC'd heparin and monitor for recovery. Monitor for signs of bleeding.    HIT panel.  CBC ordered 14. Constipation: Increased Miralax to bid.  Added Colace per patient request.  Received an enema on 5/20 15. Neurogenic bladder: Question urinary retention due to reports of difficulty voiding as well as urge to urinate with abdominal exam.  Ordered bladder scans/ PVRs to monitor voiding function. Constipation and narcotics likely playing a part in voiding difficulties.   Post Admission Physician Evaluation: 1. Preadmission assessment reviewed and changes made below. 2. Functional deficits secondary  to debility. 3. Patient is admitted to receive collaborative, interdisciplinary care between the physiatrist, rehab nursing staff, and therapy team. 4. Patient's level of medical complexity and substantial therapy needs in  context of that medical necessity cannot be provided at a lesser intensity of care such as a SNF. 5. Patient has experienced substantial functional loss from his/her baseline which was documented above under the "Functional History" and "Functional Status" headings.  Judging by the patient's diagnosis, physical exam, and functional history, the patient has potential for functional progress which will result in measurable gains while on inpatient rehab.  These gains will be of substantial and practical use upon discharge  in facilitating mobility and self-care at the household level. 6. Physiatrist will provide 24 hour management of medical needs as well as oversight of the therapy plan/treatment and provide guidance as appropriate regarding the interaction of the two. 7. 24 hour rehab nursing will assist with bladder management, safety, skin/wound care, disease management, pain management and patient education  and help integrate therapy concepts, techniques,education, etc. 8. PT will assess and treat for/with: Lower extremity strength, range of motion, stamina, balance, functional mobility, safety, adaptive techniques and equipment, wound care, coping skills, pain control, education. Goals are: Supervision/Min A. 9. OT will assess and treat for/with: ADL's, functional mobility, safety, upper extremity strength, adaptive techniques and equipment, wound mgt, ego support, and community reintegration.   Goals are: Supervision/Min A. Therapy may proceed with showering this patient. 10. Case Management and Social Worker will assess and treat for psychological issues and discharge planning. 11. Team conference will be held weekly to assess progress toward goals and to determine barriers to discharge. 12. Patient will receive at least 3 hours of therapy per day at least 5 days per week. 13. ELOS: 24-27 days.       14. Prognosis:  good  I have personally performed a face to face diagnostic evaluation, including,  but not limited to relevant history and physical exam findings, of this patient and developed relevant assessment and plan.  Additionally, I have reviewed and concur with the physician assistant's documentation above.  Erika Pensabene Posey Pronto,  MD, ABPMR Bary Leriche, PA-C 04/22/2019

## 2019-04-22 NOTE — Evaluation (Signed)
Physical Therapy Assessment and Plan  Patient Details  Name: Erika Fry MRN: [id number] Date of Birth: 02-05-1946  PT Diagnosis: Abnormal posture, Abnormality of gait, Difficulty walking, Edema, Impaired sensation, Muscle weakness and Pain in L knee Rehab Potential: Excellent ELOS: 7-10 days   Today's Date: 04/22/2019 PT Individual Time: 1000-1050 PT Individual Time Calculation (min): 50 min    Problem List:  Patient Active Problem List   Diagnosis Date Noted  . Debility 04/21/2019  . Acquired hydronephrosis with ureteropelvic junction (UPJ) obstruction   . Pain in joint involving pelvic region and thigh   . Dysuria   . Palliative care by specialist   . AKI (acute kidney injury) ([place]) 04/10/2019  . Hydronephrosis 04/09/2019  . Interstitial cystitis 04/09/2019  . Bilateral leg edema 02/25/2019  . Chronic heart failure with preserved ejection fraction ([place]) 02/24/2019  . Cardiac pacemaker in situ 02/24/2019  . ARF (acute renal failure) ([place]) 02/10/2019  . Peripheral edema   . Fever 01/17/2019  . Acute lower UTI 01/17/2019  . Pressure injury of skin 01/17/2019  . Gross hematuria 01/14/2019  . Fluid retention   . Generalized edema   . Hypokalemia   . Acute right-sided low back pain with right-sided sciatica   . Acute blood loss anemia   . Anemia of chronic disease   . Thrombocytopenia ([place])   . Diabetes mellitus ([place])   . Morbid obesity ([place])   . Benign essential HTN   . Hypoalbuminemia due to protein-calorie malnutrition ([place])   . Occult blood in stools   . Gastric polyp   . Lumbar discitis 05/17/2018  . Blood loss anemia 05/12/2018  . Hyperlipidemia associated with type 2 diabetes mellitus ([place]) 05/10/2018  . Essential hypertension 04/29/2018  . CKD (chronic kidney disease) stage 4, GFR 15-29 ml/min (HCC)   . Diet-controlled diabetes mellitus ([place])   . GERD (gastroesophageal reflux disease)   . Gout   . High cholesterol   . Hypothyroidism   . Iron  deficiency anemia   . AV block, Mobitz 2 03/25/2018  . Mobitz type 2 second degree AV block 03/20/2018  . Aortic stenosis 03/20/2018  . Exertional dyspnea 03/19/2018    Past Medical History:  Past Medical History:  Diagnosis Date  . Arthritis    "knees, thumbs" (03/25/2018)  . CKD (chronic kidney disease), stage IV ([place])   . Diet-controlled diabetes mellitus ([place])   . GERD (gastroesophageal reflux disease)   . Gout    "on daily RX" (03/25/2018)  . Heart murmur   . High cholesterol   . Hypertension   . Hypothyroidism   . Iron deficiency anemia    "had to get an iron infusion"  . Migraine    "used to have them growing up" (03/25/2018)  . Presence of permanent cardiac pacemaker 03/25/2018   Past Surgical History:  Past Surgical History:  Procedure Laterality Date  . APPENDECTOMY    . BIOPSY  05/19/2018   Procedure: BIOPSY;  Surgeon: Ladene Artist, MD;  Location: [facility];  Service: Endoscopy;;  . Consuela Mimes W/ RETROGRADES Bilateral 01/14/2019   Procedure: CYSTOSCOPY WITH RETROGRADE PYELOGRAM BILATERAL HYDRODISTENTION;  Surgeon: Ardis Hughs, MD;  Location: [facility];  Service: Urology;  Laterality: Bilateral;  . ESOPHAGOGASTRODUODENOSCOPY N/A 05/19/2018   Procedure: ESOPHAGOGASTRODUODENOSCOPY (EGD);  Surgeon: Ladene Artist, MD;  [facility];  Service: Endoscopy;  Laterality: N/A;  . ESOPHAGOGASTRODUODENOSCOPY (EGD) WITH PROPOFOL N/A 01/28/2019   Procedure: ESOPHAGOGASTRODUODENOSCOPY (EGD) WITH PROPOFOL;  Surgeon: Irving Copas., MD;  Location: [facility];  Service: Gastroenterology;  Laterality: N/A;  . INSERT / REPLACE / REMOVE PACEMAKER  03/25/2018  . IR FLUORO GUIDE CV LINE RIGHT  05/18/2018  . IR LUMBAR DISC ASPIRATION W/IMG GUIDE  05/15/2018  . IR NEPHROSTOMY PLACEMENT RIGHT  04/09/2019  . IR REMOVAL TUN CV CATH W/O FL  07/23/2018  . IR US GUIDE VASC ACCESS RIGHT  05/18/2018  . KNEE ARTHROSCOPY Bilateral   . PACEMAKER IMPLANT N/A 03/25/2018    Procedure: PACEMAKER IMPLANT;  Surgeon: Evans Lance, MD;  Location: [place] [facility];  Service: Cardiovascular;  Laterality: N/A;  . PACEMAKER PLACEMENT Left 03/2018  . POLYPECTOMY  01/28/2019   Procedure: POLYPECTOMY;  Surgeon: Mansouraty, Telford Nab., MD;  Location: [place];  Service: Gastroenterology;;  . RIGHT HEART CATH N/A 03/20/2018   Procedure: RIGHT HEART CATH;  Surgeon: Nigel Mormon, MD;  Location: [place] [facility];  Service: Cardiovascular;  Laterality: N/A;  . TEE WITHOUT CARDIOVERSION N/A 03/20/2018   Procedure: TRANSESOPHAGEAL ECHOCARDIOGRAM (TEE);  Surgeon: Nigel Mormon, MD;  Location: [facility] ENDOSCOPY;  Service: Cardiovascular;  Laterality: N/A;  . TONSILLECTOMY    . URETEROSCOPY WITH HOLMIUM LASER LITHOTRIPSY Right 01/14/2019   Procedure: URETEROSCOPY WITH HOLMIUM LASER LITHOTRIPSY BLADDER BIOPSIES RIGHT STENT PLACEMENT;  Surgeon: Ardis Hughs, MD;  Location: [facility];  Service: Urology;  Laterality: Right;    Assessment & Plan Clinical Impression: Patient is a 73 year old female with history of chronic interstitial cystitis, chronic pelvic pain, history of lumbar discitis treated with prolonged anitibiotics, chronic kidney disease due to HTN, stage IV with baseline creat about 3 to 3.6, Type 2 diabetes on insulin, complete heart block post dual chamber pacemaker, diastolic heart failure, hypothyroidism and anemia presented from interventional radiology on 04/09/2019.   Was at IR for right nephrostomy tube placement and found to have acute kidney injury as well as severe persistent pain so admitted to hospital. Obstruction due to interstitial cystitis and scarring. Nephrostomy tube draining well. AKI treated with bicarbonate infusion. Renal function fluctuating . Will have outpatient follow up with nephrology.  Febrile episode with no focus of infection found. Was treated with Meropenem 5/14 until 5/16 and now discontinued.  Intractable back pain  treated with multiple agents including oxycodone, oxybutynin and darifenacin. Palliative care consulted to assist with pain management. MRI without contrast completed that showed mostly chronic changes. No acute infection or cord compression. Patient with history of lumbar discitis and significant pelvic pain. Patient was receiving gabapentin but patient had weaned herself off as an outpatient and requested med be discontinued.  Patient transferred to CIR on 04/21/2019 .   Patient currently requires min with mobility secondary to muscle weakness, decreased cardiorespiratoy endurance and decreased standing balance and decreased balance strategies.  Prior to hospitalization, patient was modified independent  with mobility and lived with Alone in a House home.  Home access is 1 step w/ R railing Stairs to enter.  Patient will benefit from skilled PT intervention to maximize safe functional mobility, minimize fall risk and decrease caregiver burden for planned discharge home with 24 hour supervision.  Anticipate patient will benefit from follow up [place] at discharge.  PT - End of Session Activity Tolerance: Tolerates < 10 min activity, no significant change in vital signs Endurance Deficit: Yes Endurance Deficit Description: decreased, needs frequent rest breaks w/ all mobility PT Assessment Rehab Potential (ACUTE/IP ONLY): Excellent PT Barriers to Discharge: Medical stability PT Patient demonstrates impairments in the following area(s): Balance;Edema;Endurance;Safety;Sensory  PT Transfers Functional Problem(s): Bed Mobility;Bed to Chair;Car;Furniture;Floor PT Locomotion Functional Problem(s): Stairs;Ambulation PT Plan PT Intensity: Minimum of 1-2 x/day ,45 to 90 minutes PT Frequency: 5 out of 7 days PT Duration Estimated Length of Stay: 7-10 days PT Treatment/Interventions: Ambulation/gait training;Discharge planning;DME/adaptive equipment instruction;Functional mobility training;Pain  management;Psychosocial support;Splinting/orthotics;Therapeutic Activities;UE/LE Strength taining/ROM;UE/LE Coordination activities;Therapeutic Exercise;Stair training;Skin care/wound management;Patient/family education;Neuromuscular re-education;Functional electrical stimulation;Disease management/prevention;Community reintegration;Balance/vestibular training PT Transfers Anticipated Outcome(s): mod/i PT Locomotion Anticipated Outcome(s): supervision gait w/ LRAD PT Recommendation Follow Up Recommendations: Home health PT Patient destination: Home Equipment Recommended: To be determined Equipment Details: has RW, manual w/c, SPC, tub bench, and BSC  Skilled Therapeutic Intervention  Pt in w/c and agreeable to therapy, denies pain at rest but chronic L knee discomfort and pain in standing. Total assist w/c transport to/from therapy gym. Performed bed mobility, transfers, stair negotiation, and gait w/ RW as detailed below, 25' x2 reps. Additionally performed car transfer w/ min assist. Pt needed multiple seated rest breaks 2/2 fatigue, increased work of breathing w/ all mobility that resolves w/ rest.  Instructed pt in results of PT evaluation as detailed below, PT POC, rehab potential, rehab goals, and discharge recommendations. Ended session in supine, all needs in reach. Missed 10 min of skilled PT at end of session 2/2 nursing care.   PT Evaluation Precautions/Restrictions Precautions Precautions: Fall Precaution Comments: R neph tube Restrictions Weight Bearing Restrictions: No Pain Pain Assessment Pain Scale: 0-10 Pain Score: 0-No pain Home Living/Prior Functioning Home Living Available Help at Discharge: Family;Available 24 hours/day Type of Home: House Home Access: Stairs to enter [employer] of Steps: 1 step w/ R railing  Entrance Stairs-Rails: Right Home Layout: Two level;Laundry or work area in basement Alternate [employer] of Steps: Pt says she has a  basement but does not use it. Lives on main level.  Bathroom Shower/Tub: Multimedia programmer: Handicapped height Bathroom Accessibility: Yes Additional Comments: Has shower chair, w/c, RW, grabber, sock-aid  Lives With: Alone Prior Function Level of Independence: Independent with basic ADLs;Independent with homemaking with ambulation;Independent with transfers;Independent with gait  Able to Take Stairs?: Yes Driving: Yes Vocation: Retired Comments: mod I with adls/iadls.  Sleeps in recliner (helps with her allergies and breathing)  Vision/Perception  Perception Perception: Within Functional Limits Praxis Praxis: Intact  Cognition Overall Cognitive Status: Within Functional Limits for tasks assessed Arousal/Alertness: Awake/alert Orientation Level: Oriented X4 Memory: Appears intact Awareness: Appears intact Problem Solving: Appears intact Safety/Judgment: Appears intact Sensation Sensation Light Touch: Impaired Detail Peripheral sensation comments: Impaired distally>proximally  Light Touch Impaired Details: Impaired LUE;Impaired RUE;Impaired LLE;Impaired RLE Additional Comments: 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 Coordination Gross Motor Movements are Fluid and Coordinated: Yes Motor  Motor Motor: Within Functional Limits Motor - Skilled Clinical Observations: generalized weakness  Mobility Bed Mobility Bed Mobility: Rolling Right;Rolling Left;Sit to Supine;Supine to Sit Rolling Right: Supervision/verbal cueing Rolling Left: Supervision/Verbal cueing Supine to Sit: Moderate Assistance - Patient 50-74% Sit to Supine: Moderate Assistance - Patient 50-74% Transfers Transfers: Sit to Stand;Stand to Sit;Stand Pivot Transfers Sit to Stand: Minimal Assistance - Patient > 75% Stand to Sit: Minimal Assistance - Patient > 75% Stand Pivot Transfers: Minimal Assistance - Patient > 75% Stand Pivot Transfer Details: Manual facilitation for weight shifting;Manual facilitation  for placement;Verbal cues for precautions/safety;Verbal cues for gait pattern;Verbal cues for technique;Verbal cues for safe use of DME/AE Transfer (Assistive device): Rolling walker Locomotion  Gait Ambulation: Yes Gait Assistance: Minimal Assistance - Patient > 75% Gait Distance (Feet): 25 Feet  Assistive device: Rolling walker Gait Assistance Details: Verbal cues for technique;Verbal cues for precautions/safety;Verbal cues for safe use of DME/AE;Manual facilitation for weight shifting Gait Gait: Yes Gait Pattern: Impaired Gait Pattern: Trunk flexed;Wide base of support;Poor foot clearance - left;Poor foot clearance - right;Shuffle;Decreased hip/knee flexion - left;Decreased hip/knee flexion - right;Decreased dorsiflexion - right;Decreased dorsiflexion - left Stairs / Additional Locomotion Stairs: Yes Stairs Assistance: Moderate Assistance - Patient 50 - 74% Stair Management Technique: Two rails Number of Stairs: 2 Height of Stairs: 4 Wheelchair Mobility Wheelchair Mobility: No  Trunk/Postural Assessment  Cervical Assessment Cervical Assessment: Within Functional Limits Thoracic Assessment Thoracic Assessment: Exceptions to WFL(kyphotic ) Lumbar Assessment Lumbar Assessment: Exceptions to WFL(posterior pelvic tilt) Postural Control Postural Control: Within Functional Limits  Balance Balance Balance Assessed: Yes Static Sitting Balance Static Sitting - Balance Support: No upper extremity supported;Feet supported Static Sitting - Level of Assistance: 5: Stand by assistance Dynamic Sitting Balance Dynamic Sitting - Balance Support: No upper extremity supported;Feet supported Dynamic Sitting - Level of Assistance: 5: Stand by assistance Static Standing Balance Static Standing - Balance Support: No upper extremity supported;During functional activity Static Standing - Level of Assistance: 5: Stand by assistance Dynamic Standing Balance Dynamic Standing - Balance Support: No  upper extremity supported;During functional activity Dynamic Standing - Level of Assistance: 4: Min assist Extremity Assessment  RLE Assessment RLE Assessment: Exceptions to [facility] Passive Range of Motion (PROM) Comments: WFL General Strength Comments: Globally 3+ to 4/5 LLE Assessment LLE Assessment: Exceptions to [facility] Passive Range of Motion (PROM) Comments: WFL General Strength Comments: Globally 3+ to 4/5    Refer to Care Plan for Long Term Goals  Recommendations for other services: None   Discharge Criteria: Patient will be discharged from PT if patient refuses treatment 3 consecutive times without medical reason, if treatment goals not met, if there is a change in medical status, if patient makes no progress towards goals or if patient is discharged from hospital.  The above assessment, treatment plan, treatment alternatives and goals were discussed and mutually agreed upon: by patient  Bria Portales K Celia Friedland 04/22/2019, 11:00 AM

## 2019-04-22 NOTE — Progress Notes (Signed)
Inpatient Rehabilitation  Patient information reviewed and entered into eRehab system by Lamar Naef M. Kylii Ennis, M.A., CCC/SLP, PPS Coordinator.  Information including medical coding, functional ability and quality indicators will be reviewed and updated through discharge.    

## 2019-04-22 NOTE — IPOC Note (Signed)
Individualized overall Plan of Care [facility]) Patient Details Name: Erika Fry MRN: [id number] DOB: 09-13-1946  Admitting Diagnosis: [facility] Problems: Active Problems:   Debility   Chronic kidney disease   Hypotension   Drug induced constipation   Neurogenic bladder     Functional Problem List: Nursing Bladder, Bowel, Endurance, Motor, Pain, Safety, Skin Integrity  PT Balance, Edema, Endurance, Safety, Sensory  OT Balance, Endurance, Motor  SLP    TR         Basic ADL's: OT Grooming, Bathing, Dressing, Toileting     Advanced  ADL's: OT       Transfers: PT Bed Mobility, Bed to Chair, Car, [employer], Floor  OT Toilet, Tub/Shower     Locomotion: PT Stairs, Ambulation     Additional Impairments: OT None  SLP        TR      Anticipated Outcomes Item Anticipated Outcome  Self Feeding    Swallowing      Basic self-care  Mod I  Toileting  Mod I   Bathroom Transfers supervision  Bowel/Bladder  Continent to bowel and bladder with min. assist.  Transfers  mod/i  Locomotion  supervision gait w/ LRAD  Communication     Cognition     Pain  Less than 3,on 1 to 10 scale.  Safety/Judgment  Free from falls during her stay in rehab   Therapy Plan: PT Intensity: Minimum of 1-2 x/day ,45 to 90 minutes PT Frequency: 5 out of 7 days PT Duration Estimated Length of Stay: 7-10 days OT Intensity: Minimum of 1-2 x/day, 45 to 90 minutes OT Frequency: 5 out of 7 days OT Duration/Estimated Length of Stay: 7-10 days      Team Interventions: Nursing Interventions Patient/Family Education, Disease Management/Prevention, Skin Care/Wound Management, Discharge Planning, Bladder Management, Pain Management, Bowel Management  PT interventions Ambulation/gait training, Discharge planning, DME/adaptive equipment instruction, Functional mobility training, Pain management, Psychosocial support, Splinting/orthotics, Therapeutic Activities, UE/LE Strength  taining/ROM, UE/LE Coordination activities, Therapeutic Exercise, Stair training, Skin care/wound management, Patient/family education, Neuromuscular re-education, Functional electrical stimulation, Disease management/prevention, Academic librarian, Training and development officer  OT Interventions Training and development officer, Academic librarian, Discharge planning, Engineer, drilling, Functional mobility training, Patient/family education, Pain management, Psychosocial support, Self Care/advanced ADL retraining, Therapeutic Activities, Therapeutic Exercise, UE/LE Coordination activities, UE/LE Strength taining/ROM  SLP Interventions    TR Interventions    SW/CM Interventions Discharge Planning, Psychosocial Support, Patient/Family Education   Barriers to Discharge MD  Medical stability, Neurogenic bowel and bladder, Wound care, Lack of/limited family support and Weight  Nursing      PT Medical stability    OT      SLP      SW       Team Discharge Planning: Destination: PT-Home ,OT- Home , SLP-  Projected Follow-up: PT-Home health PT, OT-  Home health OT, SLP-  Projected Equipment Needs: PT-To be determined, OT- To be determined, SLP-  Equipment Details: PT-has RW, manual w/c, SPC, tub bench, and BSC, OT-  Patient/family involved in discharge planning: PT- Patient,  OT-Patient, SLP-   MD ELOS: 9-13 days. Medical Rehab Prognosis:  Good Assessment: Erika Fry is a 73 year old female with history of CKD--IV due to HTN, T2DM, CHB s/p PPM,  Lumbar diskitis s/p CIR 05/2018, chronic interstitial cystitis with chronic pelvic pain, right obstructive hydroperinephrosis who underwent right percutaneous nephrostomy by IR on 04/09/19.  She developed sever pain post procedure and as lives alone, was admitted for IV morphine/pain management.  She continued to be limited by bladder/suprapubic pain and urology consulted for input. She has tried multiple treatment with minimal  improvement in symptoms. Analgesia as well lumbar MRI recommended due rule out neurological cause of pain. She spiked fever on 5/14 and she was started on meropenum -- BC/UCS negative therefore antibiotics d/c.    Acute on chronic renal failure with metabolic acidosis treated with bicarb and fluids with some improvement. Dr. Royce Macadamia consulted and recommended discontinuation of IVF and Bicarb past 1 L as well as monitoring closely for fluid overload and UOP.   Palliative care consulted to discuss [place] and pain management--patient elected on full scope of care. She was opposed to using gabapentin and pain control improving with use of IV fentanyl.   MRI  Lumbar spine done changes due to chronic L4/5 diskitis with lumbar spondylosis --no evidence of persistent or new infection and improvement in right hydronphrosis. Patient has had gradual functional decline since last fall and pending HD in the near future.  She lives alone and has had significant decline in the past few months with multiple admissions.  Patient with resulting functional deficits with mobility, transfers, pain, endurance, self-care.  We will set goals for Mod I/Supervision with PT/OT.  Due to the current state of emergency, patients may not be receiving their 3-hours of Medicare-mandated therapy.  See Team Conference Notes for weekly updates to the plan of care

## 2019-04-22 NOTE — Progress Notes (Signed)
Initial Nutrition Assessment  RD working remotely.  DOCUMENTATION CODES:   Not applicable, unable to assess for malnutrition at this time  INTERVENTION:   - Continue Ensure Enlive po BID, each supplement provides 350 kcal and 20 grams of protein  - Continue Pro-stat 30 ml BID, each supplement provides 100 kcals and 15 grams protein  - Continue MVI with minerals daily  NUTRITION DIAGNOSIS:   Increased nutrient needs related to other (therapies) as evidenced by estimated needs.  GOAL:   Patient will meet greater than or equal to 90% of their needs  MONITOR:   PO intake, Supplement acceptance, Labs, Weight trends  REASON FOR ASSESSMENT:   Malnutrition Screening Tool    ASSESSMENT:   73 year old female with PMH of chronic interstitial cystitis, chronic pelvic pain, history of lumbar discitis treated with prolonged anitibiotics, CKD stage IV due to HTN, T2DM on insulin, complete heart block s/p dual chamber pacemaker, CHF, hypothyroidism, and anemia. Pt presented on 04/09/19 from IR where she was there for right nephrostomy tube placement and found to have AKI as well as severe persistent pain so admitted. Obstruction due to interstitial cystitis and scarring. Nephrostomy tube draining well. Palliative care consulted to assist with pain management. Pt admitted to CIR on 5/20.  Reviewed RN edema assessment. Pt with mild pitting edema to BUE and moderate pitting edema to BLE.  Spoke with pt via phone call to room. Pt reports that her appetite is "pretty good" and states that the food at [facility] is "much better" than the food at [facility]. Pt reports she did well with her breakfast this morning.  Pt states that she was drinking Ensure Enlive at [facility] but has not yet received once since admission to CIR. Per MAR, 0800 Ensure Enlive not documented as having been provided to pt. Noted Pro-stat was provided to pt. Pt states that she will drink the Ensure Enlive if it is provided to her.  Pt states  that she has been losing weight for a couple of years. Pt states she does not know her baseline weight prior to weight loss. Pt reports she has been losing weight "because I haven't been eating much." Pt is unsure of exact timeframe of decreased PO intake. Pt reports that her "new normal weight" is 144-145 lbs. Noted weight this morning was 168 lbs.  Reviewed weight history in chart. Pt with decline in weight over the last 1 year. Overall, pt with 14.4 kg weight loss since 05/06/18. This is a 15.8% weight loss which is not quite significant for timeframe. Noted weight on 04/09/19 was 67.3 kg but weight on admission to CIR on 5/20 was 77.1 kg. Will continue to monitor trends.  RD encouraged adequate PO intake with a focus on protein foods to maintain lean muscle mass. Pt expresses understanding and states she needs to eat because she "needs all the energy I can get." RD provided positive reinforcement.  Meal Completion: 75% x 1 meal  Medications reviewed and include: Colace, Ensure Enlive BID, Pro-stat 30 ml BID, ferrous sulfate, folic acid, SSI, MVI with minerals daily, Protonix, Miralax, vitamin B-12  Labs reviewed: sodium 132 (L), hemoglobin 7.0 (L) CBG's: 109, 132, 89, 120 x 24 hours  NUTRITION - FOCUSED PHYSICAL EXAM:  Unable to complete at this time. RD working remotely.  Diet Order:   Diet Order            Diet heart healthy/carb modified Room service appropriate? Yes; Fluid consistency: Thin; Fluid restriction: 1200 mL Fluid  Diet effective now              EDUCATION NEEDS:   Education needs have been addressed  Skin:  Skin Assessment: Reviewed RN Assessment  Last BM:  04/22/19 small type 1  Height:   Ht Readings from Last 1 Encounters:  04/21/19 5\' 6"  (1.676 m)    Weight:   Wt Readings from Last 1 Encounters:  04/22/19 76.3 kg    Ideal Body Weight:  59.1 kg  BMI:  Body mass index is 27.15 kg/m.  Estimated Nutritional Needs:   Kcal:  1650-1850  Protein:   75-90 grams  Fluid:  1.7-1.9 L    Gaynell Face, MS, RD, LDN Inpatient Clinical Dietitian Pager: [phone] Weekend/After Hours: [phone]
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
## 2019-04-23 ENCOUNTER — Inpatient Hospital Stay (HOSPITAL_COMMUNITY): Payer: Medicare Other | Admitting: Physical Therapy

## 2019-04-23 ENCOUNTER — Inpatient Hospital Stay (HOSPITAL_COMMUNITY): Payer: Medicare Other | Admitting: Occupational Therapy

## 2019-04-23 ENCOUNTER — Ambulatory Visit: Payer: Medicare Other | Admitting: Cardiology

## 2019-04-23 DIAGNOSIS — Z950 Presence of cardiac pacemaker: Secondary | ICD-10-CM

## 2019-04-23 DIAGNOSIS — E46 Unspecified protein-calorie malnutrition: Secondary | ICD-10-CM

## 2019-04-23 DIAGNOSIS — Z45018 Encounter for adjustment and management of other part of cardiac pacemaker: Secondary | ICD-10-CM

## 2019-04-23 DIAGNOSIS — I441 Atrioventricular block, second degree: Secondary | ICD-10-CM

## 2019-04-23 DIAGNOSIS — E669 Obesity, unspecified: Secondary | ICD-10-CM

## 2019-04-23 DIAGNOSIS — N185 Chronic kidney disease, stage 5: Secondary | ICD-10-CM

## 2019-04-23 DIAGNOSIS — D649 Anemia, unspecified: Secondary | ICD-10-CM

## 2019-04-23 DIAGNOSIS — E1169 Type 2 diabetes mellitus with other specified complication: Secondary | ICD-10-CM

## 2019-04-23 DIAGNOSIS — E871 Hypo-osmolality and hyponatremia: Secondary | ICD-10-CM

## 2019-04-23 DIAGNOSIS — I9589 Other hypotension: Secondary | ICD-10-CM

## 2019-04-23 LAB — GLUCOSE, CAPILLARY
Glucose-Capillary: 101 mg/dL — ABNORMAL HIGH (ref 70–99)
Glucose-Capillary: 132 mg/dL — ABNORMAL HIGH (ref 70–99)
Glucose-Capillary: 101 mg/dL — ABNORMAL HIGH (ref 70–99)
Glucose-Capillary: 124 mg/dL — ABNORMAL HIGH (ref 70–99)

## 2019-04-23 MED ORDER — FLAVOXATE HCL 100 MG PO TABS
100.0000 mg | ORAL_TABLET | Freq: Three times a day (TID) | ORAL | Status: DC | PRN
  Administered 2019-04-23 – 2019-05-01 (×14): 100 mg via ORAL
  Filled 2019-04-23 (×16): qty 1

## 2019-04-23 MED ORDER — FLUCONAZOLE 150 MG PO TABS
150.0000 mg | ORAL_TABLET | Freq: Once | ORAL | Status: AC
  Administered 2019-04-23: 20:00:00 150 mg via ORAL
  Filled 2019-04-23: qty 1

## 2019-04-23 NOTE — Plan of Care (Signed)
  Problem: Consults Goal: RH GENERAL PATIENT EDUCATION Description See Patient Education module for education specifics. Outcome: Progressing   Problem: RH BOWEL ELIMINATION Goal: RH STG MANAGE BOWEL WITH ASSISTANCE Description STG Manage Bowel with Min.Assistance.  Outcome: Progressing Goal: RH STG MANAGE BOWEL W/MEDICATION W/ASSISTANCE Description STG Manage Bowel with Medication with min. Assistance.  Outcome: Progressing   Problem: RH BLADDER ELIMINATION Goal: RH STG MANAGE BLADDER WITH ASSISTANCE Description STG Manage Bladder With Mod.Assistance  Outcome: Progressing Goal: RH STG MANAGE BLADDER WITH MEDICATION WITH ASSISTANCE Description STG Manage Bladder With Medication With Mod. Assistance.  Outcome: Progressing Goal: RH STG MANAGE BLADDER WITH EQUIPMENT WITH ASSISTANCE Description STG Manage Bladder With Equipment With min. Assistance  Outcome: Progressing   Problem: RH SKIN INTEGRITY Goal: RH STG SKIN FREE OF INFECTION/BREAKDOWN Description With min. Assist.  Outcome: Progressing Goal: RH STG MAINTAIN SKIN INTEGRITY WITH ASSISTANCE Description STG Maintain Skin Integrity With Min.Assistance.  Outcome: Progressing   Problem: RH SAFETY Goal: RH STG ADHERE TO SAFETY PRECAUTIONS W/ASSISTANCE/DEVICE Description STG Adhere to Safety Precautions With Mod. Assistance/Device.  Outcome: Progressing   Problem: RH PAIN MANAGEMENT Goal: RH STG PAIN MANAGED AT OR BELOW PT'S PAIN GOAL Description Less than 3  Outcome: Progressing   Problem: RH KNOWLEDGE DEFICIT GENERAL Goal: RH STG INCREASE KNOWLEDGE OF SELF CARE AFTER HOSPITALIZATION Description Pt. Will be able to verbalized the instruction about bladder and bowel program  Outcome: Progressing

## 2019-04-23 NOTE — Progress Notes (Signed)
Physical Therapy Session Note  Patient Details  Name: Erika Fry MRN: [id number] Date of Birth: 06-04-1946  Today's Date: 04/23/2019 PT Individual Time: 1301-1345 PT Individual Time Calculation (min): 44 min   Short Term Goals: Week 1:  PT Short Term Goal 1 (Week 1): Pt will ambulate 70' w/ supervision using LRAD PT Short Term Goal 2 (Week 1): Pt will negotiate 2 steps w/ R rail and min assist PT Short Term Goal 3 (Week 1): Pt will perform bed mobility w/ min assist PT Short Term Goal 4 (Week 1): Pt will perform stand pivot transfer w/ supervision    Therapy Documentation Precautions:  Precautions Precautions: Fall Precaution Comments: R neph tube Restrictions Weight Bearing Restrictions: No   Treatment: Pt in bed on arrival. Agreed to PT at this time. No pain currently.   Exercises: bil LE's: ankle pumps and quad sets x 10 reps active; straight leg raises and short arc quads x 10 reps each AA for controlled movements.   Bed Mobility: supervision for supine to sitting at edge of bed with HOB 25 degrees and rail used. Pt supervision to scoot her hips closer to edge of bed as well. Min assist to lift LE's onto bed surface with sitting edge of bed to supine at end of session.   Transfers: Min guard assist with cues on hand placement for sit<>stand transfers x 3 (x1 at bed, x2 from toilet) with session. Use of BSC over toilet in bathroom with pt using armrests to stand. RW needed to stabilize with standing each time. Pt able to stand at sink x 2 reps with intermittent UE support to wash hands- including turing water on/off, reaching for soap and for paper towels, all with min guard assist.   Gait: ~15 feet x 2 (to/from bathroom) with RW with min guard assist, plus about 8 feet in between when pt went from bathroom to sink, then back to bathroom.  Cues for posture and walker position with gait. Decreased gait speed noted.   Clinical Impression: Pt left in bed with bed alarm on  and needs in reach. NT in room to perform a bladder scan as well. Session limited by pt need to urinate with reporting pressure with standing, however with 2 attempts on toilet pt was unable to produce an urine. Pt with reports of pain and burning in her perineum as well with voiding attempts. RN aware and in room at end of session to address (along with NT).   Therapy/Group: Individual Therapy  Lindon Romp, PTA, CLT 04/23/19, 4:32 PM
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
## 2019-04-23 NOTE — Progress Notes (Signed)
Occupational Therapy Session Note  Patient Details  Name: Erika Fry MRN: [id number] Date of Birth: [birth year]  Today's Date: 04/23/2019 OT Individual Time: 1100-1200 OT Individual Time Calculation (min): 60 min    Short Term Goals: Week 1:  OT Short Term Goal 1 (Week 1): STG=LTG 2/2 ELOS  Skilled Therapeutic Interventions/Progress Updates:    Pt seen this session to focus on LE strength. Pt stated, " I don't need OT as I already know how to do so many things, I just need a lot of PT".  Educated pt on the role of OT and how LE strength, balance and endurance contributes to how she can get her ADLs accomplished. Pt agreeable to working on LE exercises.  Pt sat to EOB with S and then worked on AROM exercises for isometric holds, AROM, a/arom, and resisted work for [employer], hamstrings, adductors and calves. Pt tolerated exercises well but need a break every few minutes.  She wanted to try to toilet. Stood to [employer] with CGA, ambulated to bathroom and managed toileting with S.  Pt has a lot of redness and pain if vaginal area, applied cream herself. Ambulated to sink to wash hands and then back to bed with CGA.  Pt sat at EOB for a blood draw and then needed min A to lift legs into bed and then A to get scooted up in bed to sit upright for lunch. Pt resting in bed with alarm set and all needs met.  Therapy Documentation Precautions:  Precautions Precautions: Fall Precaution Comments: R neph tube Restrictions Weight Bearing Restrictions: No     Pain: Pain Assessment Pain Scale: 0-10 Pain Score: 0-No pain , but does have pain in perineal area with pain with attempted urination   Therapy/Group: Individual Therapy  [place] 04/23/2019, 12:26 PM

## 2019-04-23 NOTE — Progress Notes (Signed)
Occupational Therapy Session Note  Patient Details  Name: Erika Fry MRN: [id number] Date of Birth: 12/24/1945  Today's Date: 04/23/2019 OT Individual Time: 3846-6599 OT Individual Time Calculation (min): 58 min    Short Term Goals: Week 1:  OT Short Term Goal 1 (Week 1): STG=LTG 2/2 ELOS  Skilled Therapeutic Interventions/Progress Updates:    Pt greeted semi-reclined in bed and agreeable to OT treatment session. Pt requests to change shirts. Encouraged pt to come to sitting EOB first, but she wanted to do it in the bed so she needed min A to come into long sitting while OT pulled shirt over back. Pt then came to sitting EOB with supervision. Pt reported need to go to the bathroom. Pt needed CGA to stand from bed with RW and ambulated into bathroom w/ RW and CGA. Dr. Posey Pronto entered room and pt tolerated standing for 3 minutes to talk to him. She then continued into bathroom and transitioned onto commode using grab bars. Pt voided bladder and had very small BM. Pt had burning pain with urination. Pt was able to complete peri-care with set-up A and needed ecouragement to try. Ambulated back out of bathroom and OT educated on RW positioning at the sink to wash hands. Worked on LB dressing strategies with pt able to achieve figure 4 position, but still needed A to thread brief and pants over feet as she was self-limiting. Pt completed 8 minutes on [employer] arm bike with multiple rest breaks. Pt requests to return to bed at end of session despite encouragement to stay up. Min A stand-pivot without device. Pt left semi-reclined in bed with bed alarm on and needs met.   Therapy Documentation Precautions:  Precautions Precautions: Fall Precaution Comments: R neph tube Restrictions Weight Bearing Restrictions: No Pain: Pain Assessment Pain Scale: 0-10 Pain Score: 6  Pain Type: Acute pain Pain Location: Genitalia Pain Descriptors / Indicators: Burning Pain Onset: Other (Comment)(when  urinating) Pain Intervention(s): Rest   Therapy/Group: Individual Therapy  Valma Cava 04/23/2019, 12:53 PM

## 2019-04-24 DIAGNOSIS — N184 Chronic kidney disease, stage 4 (severe): Secondary | ICD-10-CM

## 2019-04-24 LAB — GLUCOSE, CAPILLARY
Glucose-Capillary: 103 mg/dL — ABNORMAL HIGH (ref 70–99)
Glucose-Capillary: 141 mg/dL — ABNORMAL HIGH (ref 70–99)
Glucose-Capillary: 121 mg/dL — ABNORMAL HIGH (ref 70–99)
Glucose-Capillary: 146 mg/dL — ABNORMAL HIGH (ref 70–99)

## 2019-04-24 NOTE — Plan of Care (Signed)
  Problem: RH SAFETY Goal: RH STG ADHERE TO SAFETY PRECAUTIONS W/ASSISTANCE/DEVICE Description STG Adhere to Safety Precautions With Mod. Assistance/Device.  Outcome: Progressing   Problem: RH PAIN MANAGEMENT Goal: RH STG PAIN MANAGED AT OR BELOW PT'S PAIN GOAL Description Less than 3  Outcome: Progressing

## 2019-04-24 NOTE — Progress Notes (Signed)
[facility] PHYSICAL MEDICINE & REHABILITATION PROGRESS NOTE  Subjective/Complaints: Patient complains of bladder spasms that are severe today.  ROS: Denies CP, shortness of breath, nausea, vomiting, diarrhea.  Objective: Vital Signs: Blood pressure 119/66, pulse 69, temperature 97.8 F (36.6 C), temperature source Oral, resp. rate 14, height 5\' 6"  (1.676 m), weight 76.5 kg, SpO2 97 %. No results found. Recent Labs    04/22/19 0442  WBC 7.6  HGB 7.0*  HCT 21.9*  PLT 245   Recent Labs    04/22/19 0442  NA 132*  K 4.4  CL 99  CO2 23  GLUCOSE 118*  BUN 69*  CREATININE 4.90*  CALCIUM 8.8*    Physical Exam: BP 119/66 (BP Location: Left Wrist)   Pulse 69   Temp 97.8 F (36.6 C) (Oral)   Resp 14   Ht 5\' 6"  (1.676 m)   Wt 76.5 kg   SpO2 97%   BMI 27.22 kg/m  Constitutional: No distress . Vital signs reviewed.  Obese HENT: Normocephalic.  Atraumatic. Eyes: EOMI. No discharge. Cardiovascular: No JVD. Respiratory: Normal effort. GI: Non-distended. GU: Right nephrostomy tube Musc: LE>>UE edema Neurological: She is alert and oriented  Motor: Bilateral upper extremities: 5/5 proximal distal Bilateral lower extremities: Hip flexion, knee extension 2+-3-/5, ankle dorsiflexion 4+/5 (left stronger than right)  Skin: Skin is warm and dry. No rash noted. She is not diaphoretic. No erythema.  Psychiatric: She has a normal mood and affect. Her behavior is normal.   Assessment/Plan: 1. Functional deficits secondary to debility which require 3+ hours per day of interdisciplinary therapy in a comprehensive inpatient rehab setting.  Physiatrist is providing close team supervision and 24 hour management of active medical problems listed below.  Physiatrist and rehab team continue to assess barriers to discharge/monitor patient progress toward functional and medical goals  Care Tool:  Bathing    Body parts bathed by patient: Right arm, Left arm, Chest, Abdomen, Front perineal  area, Right upper leg, Left upper leg   Body parts bathed by helper: Right lower leg, Left lower leg, Buttocks     Bathing assist Assist Level: Moderate Assistance - Patient 50 - 74%     Upper Body Dressing/Undressing Upper body dressing   What is the patient wearing?: Pull over shirt    Upper body assist Assist Level: Contact Guard/Touching assist    Lower Body Dressing/Undressing Lower body dressing      What is the patient wearing?: Pants, Incontinence brief     Lower body assist Assist for lower body dressing: Moderate Assistance - Patient 50 - 74%     Toileting Toileting    Toileting assist Assist for toileting: Contact Guard/Touching assist     Transfers Chair/bed transfer  Transfers assist     Chair/bed transfer assist level: Maximal Assistance - Patient 25 - 49%     Locomotion Ambulation   Ambulation assist      Assist level: Minimal Assistance - Patient > 75% Assistive device: Walker-rolling Max distance:  25'   Walk 10 feet activity   Assist     Assist level: Minimal Assistance - Patient > 75% Assistive device: Walker-rolling   Walk 50 feet activity   Assist Walk 50 feet with 2 turns activity did not occur: Safety/medical concerns         Walk 150 feet activity   Assist Walk 150 feet activity did not occur: Safety/medical concerns         Walk 10 feet on uneven surface  activity  Assist Walk 10 feet on uneven surfaces activity did not occur: Safety/medical concerns         Wheelchair     Assist Will patient use wheelchair at discharge?: No   Wheelchair activity did not occur: N/A         Wheelchair 50 feet with 2 turns activity    Assist    Wheelchair 50 feet with 2 turns activity did not occur: N/A       Wheelchair 150 feet activity     Assist Wheelchair 150 feet activity did not occur: N/A          Medical Problem List and Plan: 1.  Functional deficits secondary to debility and  pain.   Continue CIR 2.  Antithrombotics: -DVT/anticoagulation:  Mechanical: Sequential compression devices, below knee Bilateral lower extremities             -antiplatelet therapy: N/A 3. Multilevel lumbar spondylosis/Pain Management: Oxycodone/tylenol prn 4. Mood: LCSW to follow for evaluation and support.              -antipsychotic agents: N/A 5. Neuropsych: This patient is capable of making decisions on her own behalf. 6. Skin/Wound Care: routine pressure relief measures.  7. Fluids/Electrolytes/Nutrition: Strict I/O. 1200 cc FR. Added prostat to help with nutritional status. Continue Ensure bid.   8. Chronic interstitial cystitis/Right hydronephrosis: Continue Enablex with oxycodone prn --patient just received oxycodone nursing will get Enablex, may need to increase dosage to 15 mg,  9. CKD: continue NaHCO3.  Hold Lasix at this time.  Monitor weights daily.  Monitor for signs and symptoms of fluid overload.    Creatinine 4.90 on 5/21, labs pending  Labs ordered for Monday  Continue to monitor 10. T2DM: Controlled with diet--will monitor BS ac/hs and use SSI prn for elevated BS. Family bringing food from home.              Slightly labile on 5/22  Monitor with increased mobility 11.  Hypotension: Ordered TEDs. Encourage OOB to help build endurance.             Orthostatics negative on 5/22 12. Anemia of chronic disease: H/o heme positive stools and has declined GI follow up.              Hemoglobin 7.0 on 5/21, labs pending  Labs ordered for Monday  Continue to monitor 13. Thrombocytopenia: Resolved  Platelets 245 on 5/21 14. Constipation: Increased Miralax to bid.  Added Colace per patient request.             Improving 15. Neurogenic bladder:   PVRs unremarkable  Will consider medications 16.  Hyponatremia  Sodium 132 on 5/21, labs ordered for Monday  Continue to monitor 17.  Hypoalbuminemia  See #7  LOS: 3 days A FACE TO [place] E  Virat Prather 04/24/2019, 9:42 AM

## 2019-04-24 NOTE — Progress Notes (Signed)
Inpatient [facility] Individual Statement of Services  Patient Name:  Erika Fry  Date:  04/24/2019  Welcome to the [facility].  Our goal is to provide you with an individualized program based on your diagnosis and situation, designed to meet your specific needs.  With this comprehensive rehabilitation program, you will be expected to participate in at least 3 hours of rehabilitation therapies Monday-Friday, with modified therapy programming on the weekends.  Your rehabilitation program will include the following services:  Physical Therapy (PT), Occupational Therapy (OT), 24 hour per day rehabilitation nursing, Case Management (Social Worker), Rehabilitation Medicine, Nutrition Services and Pharmacy Services  Weekly team conferences will be held on Wednesdays to discuss your progress.  Your Social Worker will talk with you frequently to get your input and to update you on team discussions.  Team conferences with you and your family in attendance may also be held.  Expected length of stay:  7 to 10 days  Overall anticipated outcome: Modified independent with supervision for bathing, car transfers, ambulation, and stairs  Depending on your progress and recovery, your program may change. Your Social Worker will coordinate services and will keep you informed of any changes. Your Social Worker's name and contact numbers are listed  below.  The following services may also be recommended but are not provided by the [facility] will be made to provide these services after discharge if needed.  Arrangements include referral to agencies that provide these services.  Your insurance has been verified to be:  [employer] Your primary doctor is:  Dr. [name]  Pertinent information will be shared with your  doctor and your insurance company.  Social Worker:  Alfonse Alpers, LCSW  [phone] or [phone]  Information discussed with and copy given to patient by: Trey Sailors, 04/24/2019, 10:31 PM

## 2019-04-25 ENCOUNTER — Inpatient Hospital Stay (HOSPITAL_COMMUNITY): Payer: Medicare Other | Admitting: Physical Therapy

## 2019-04-25 ENCOUNTER — Inpatient Hospital Stay (HOSPITAL_COMMUNITY): Payer: Medicare Other | Admitting: Occupational Therapy

## 2019-04-25 LAB — URINALYSIS, ROUTINE W REFLEX MICROSCOPIC
Bacteria, UA: NONE SEEN
Bilirubin Urine: NEGATIVE
Glucose, UA: NEGATIVE mg/dL
Ketones, ur: NEGATIVE mg/dL
Nitrite: NEGATIVE
Protein, ur: 100 mg/dL — AB
RBC / HPF: >50 RBC/hpf — ABNORMAL HIGH (ref 0–5)
Specific Gravity, Urine: 1.009 (ref 1.005–1.030)
WBC, UA: >50 WBC/hpf — ABNORMAL HIGH (ref 0–5)
pH: 7 (ref 5.0–8.0)

## 2019-04-25 LAB — GLUCOSE, CAPILLARY
Glucose-Capillary: 98 mg/dL (ref 70–99)
Glucose-Capillary: 115 mg/dL — ABNORMAL HIGH (ref 70–99)
Glucose-Capillary: 94 mg/dL (ref 70–99)
Glucose-Capillary: 121 mg/dL — ABNORMAL HIGH (ref 70–99)

## 2019-04-25 NOTE — Progress Notes (Signed)
Occupational Therapy Session Note  Patient Details  Name: Erika Fry MRN: [id number] Date of Birth: 08/26/1946  Today's Date: 04/25/2019 OT Individual Time: 0800-0900  And 1435-1540 OT Individual Time Calculation (min): 60 min and 65 min   Short Term Goals: Week 1:  OT Short Term Goal 1 (Week 1): STG=LTG 2/2 ELOS  Skilled Therapeutic Interventions/Progress Updates:    Visit 1:  Pain Assessment Pain Score: 8  Pain Location: Abdomen Pain Descriptors / Indicators: Aching Pain Onset: On-going Pain Intervention(s): Rest  Pt received sitting on regular toilet. Due to lower seat and lack of handles, pt needed min A to rise to stand. Once standing she was able to cleanse and adjust her clothing with S.   Pt ambulated to sink with RW to stand to wash hands and then needed to sit.  She place ice pack in underwear to help with her pain.  pt sat at sink to complete bathing and dressing.  Pt able to bathe self except for feet.  She frequently asks for help with things that she could do herself and needs verbal cues and prompting to complete such as donning her shirt.  She practiced using a reacher to don pants over feet with min cues for technique, S with sit to stand to pull pants over hips.  Therapist applied TEDS and socks.  She has quite a bit of edema in LE. Pt advised to work on AROM of feet.  Pt opted to rest in bed.  Used RW to transfer to EOB with mod A to lift legs into bed. Pt resting in bed with all needs met.  Bed alarm set.     Visit 2: Pain: 8/10 abdomen, ice pack to perineal area (pt request) Pt received in bed and agreeable to therapy.  Pt sat to EOB with S and stood with RW with S to transfer to wc. She stated the ice pack she keeps between her legs was melted so obtained another one. Pt stood to adjust pants and place ice pack with S.  Pt taken to therapy gym via w/c to work on LE strength and endurance.  Pt stood to side of parallel bars to work on sets of heel raises, toe  raises, side steps, forward taps, knee lifts, hip abd.  Pt could tolerate standing for a few minutes at a time,  Then needed to rest.  Repeated for 3 sets. Worked on a few UE AROM exercises but it was uncomfortable to sit up straight with the ice pack in the position it was.  Pt taken back to room and needed to toilet, ambulated with RW from bed to toilet, used toilet, walked to sink to wash hands and then back to bed all with S.  Continues to need A to bring legs onto bed. Pt resting in bed with all needs met, bed alarm set.   Therapy Documentation Precautions:  Precautions Precautions: Fall Precaution Comments: R neph tube Restrictions Weight Bearing Restrictions: No       Therapy/Group: Individual Therapy  SAGUIER,JULIA 04/25/2019, 9:38 AM 

## 2019-04-25 NOTE — Progress Notes (Signed)
[facility] PHYSICAL MEDICINE & REHABILITATION PROGRESS NOTE  Subjective/Complaints: Bladder spasms much improved today, using ice in the suprapubic area with great relief  ROS: Denies CP, shortness of breath, nausea, vomiting, diarrhea.  Objective: Vital Signs: Blood pressure 135/78, pulse 71, temperature (!) 97.5 F (36.4 C), temperature source Oral, resp. rate 16, height 5\' 6"  (1.676 m), weight 77.4 kg, SpO2 98 %. No results found. No results for input(s): WBC, HGB, HCT, PLT in the last 72 hours. No results for input(s): NA, K, CL, CO2, GLUCOSE, BUN, CREATININE, CALCIUM in the last 72 hours.  Physical Exam: BP 135/78 (BP Location: Right Wrist)   Pulse 71   Temp (!) 97.5 F (36.4 C) (Oral)   Resp 16   Ht 5\' 6"  (1.676 m)   Wt 77.4 kg   SpO2 98%   BMI 27.54 kg/m  Constitutional: No distress . Vital signs reviewed.  Obese HENT: Normocephalic.  Atraumatic. Eyes: EOMI. No discharge. Cardiovascular: No JVD. Respiratory: Normal effort. GI: Non-distended. GU: Right nephrostomy tube Musc: LE>>UE edema Neurological: She is alert and oriented  Motor: Bilateral upper extremities: 5/5 proximal distal Bilateral lower extremities: Hip flexion, knee extension 2+-3-/5, ankle dorsiflexion 4+/5 (left stronger than right)  Skin: Skin is warm and dry. No rash noted. She is not diaphoretic. No erythema.  Psychiatric: She has a normal mood and affect. Her behavior is normal.   Assessment/Plan: 1. Functional deficits secondary to debility which require 3+ hours per day of interdisciplinary therapy in a comprehensive inpatient rehab setting.  Physiatrist is providing close team supervision and 24 hour management of active medical problems listed below.  Physiatrist and rehab team continue to assess barriers to discharge/monitor patient progress toward functional and medical goals  Care Tool:  Bathing    Body parts bathed by patient: Right arm, Left arm, Chest, Abdomen, Front perineal  area, Right upper leg, Left upper leg   Body parts bathed by helper: Right lower leg, Left lower leg, Buttocks     Bathing assist Assist Level: Moderate Assistance - Patient 50 - 74%     Upper Body Dressing/Undressing Upper body dressing   What is the patient wearing?: Pull over shirt    Upper body assist Assist Level: Contact Guard/Touching assist    Lower Body Dressing/Undressing Lower body dressing      What is the patient wearing?: Pants, Incontinence brief     Lower body assist Assist for lower body dressing: Moderate Assistance - Patient 50 - 74%     Toileting Toileting    Toileting assist Assist for toileting: Contact Guard/Touching assist     Transfers Chair/bed transfer  Transfers assist     Chair/bed transfer assist level: Maximal Assistance - Patient 25 - 49%     Locomotion Ambulation   Ambulation assist      Assist level: Minimal Assistance - Patient > 75% Assistive device: Walker-rolling Max distance:  25'   Walk 10 feet activity   Assist     Assist level: Minimal Assistance - Patient > 75% Assistive device: Walker-rolling   Walk 50 feet activity   Assist Walk 50 feet with 2 turns activity did not occur: Safety/medical concerns         Walk 150 feet activity   Assist Walk 150 feet activity did not occur: Safety/medical concerns         Walk 10 feet on uneven surface  activity   Assist Walk 10 feet on uneven surfaces activity did not occur: Safety/medical concerns  Wheelchair     Assist Will patient use wheelchair at discharge?: No   Wheelchair activity did not occur: N/A         Wheelchair 50 feet with 2 turns activity    Assist    Wheelchair 50 feet with 2 turns activity did not occur: N/A       Wheelchair 150 feet activity     Assist Wheelchair 150 feet activity did not occur: N/A          Medical Problem List and Plan: 1.  Functional deficits secondary to debility and  pain.   Continue CIR, PT OT 2.  Antithrombotics: -DVT/anticoagulation:  Mechanical: Sequential compression devices, below knee Bilateral lower extremities             -antiplatelet therapy: N/A 3. Multilevel lumbar spondylosis/Pain Management: Oxycodone/tylenol prn 4. Mood: LCSW to follow for evaluation and support.              -antipsychotic agents: N/A 5. Neuropsych: This patient is capable of making decisions on her own behalf. 6. Skin/Wound Care: routine pressure relief measures.  7. Fluids/Electrolytes/Nutrition: Strict I/O. 1200 cc FR. Added prostat to help with nutritional status. Continue Ensure bid.   8. Chronic interstitial cystitis/Right hydronephrosis: Continue Enablex with oxycodone prn --patient just received oxycodone nursing will get Enablex, may need to increase dosage to 15 mg, patient is getting good relief with local ice 9. CKD: continue NaHCO3.  Hold Lasix at this time.  Monitor weights daily.  Monitor for signs and symptoms of fluid overload.    Creatinine 4.90 on 5/21,   Labs ordered for Monday  Continue to monitor 10. T2DM: Controlled with diet--will monitor BS ac/hs and use SSI prn for elevated BS. Family bringing food from home.     CBG (last 3)  Recent Labs    04/24/19 1656 04/24/19 2124 04/25/19 0644  GLUCAP 121* 146* 98  Well controlled, 04/25/2019 11.  Hypotension: Ordered TEDs. Encourage OOB to help build endurance.             Orthostatics negative on 5/22 12. Anemia of chronic disease: H/o heme positive stools and has declined GI follow up.              Hemoglobin 7.0 on 5/21, labs pending  Labs ordered for Monday  Continue to monitor 13. Thrombocytopenia: Resolved  Platelets 245 on 5/21 14. Constipation: Increased Miralax to bid.  Added Colace per patient request.             Improving 15. Neurogenic bladder:   PVRs unremarkable  Will consider medications 16.  Hyponatremia  Sodium 132 on 5/21, labs ordered for Monday  Continue to  monitor 17.  Hypoalbuminemia  See #7  LOS: 4 days A FACE TO FACE EVALUATION WAS PERFORMED  Charlett Blake 04/25/2019, 9:12 AM

## 2019-04-25 NOTE — Progress Notes (Signed)
Physical Therapy Session Note  Patient Details  Name: Erika Fry MRN: [id number] Date of Birth: 1946/06/19  Today's Date: 04/25/2019 PT Individual Time: 1106-1215 PT Individual Time Calculation (min): 69 min   Short Term Goals: Week 1:  PT Short Term Goal 1 (Week 1): Pt will ambulate 102' w/ supervision using LRAD PT Short Term Goal 2 (Week 1): Pt will negotiate 2 steps w/ R rail and min assist PT Short Term Goal 3 (Week 1): Pt will perform bed mobility w/ min assist PT Short Term Goal 4 (Week 1): Pt will perform stand pivot transfer w/ supervision  Skilled Therapeutic Interventions/Progress Updates:    Pt received supine in bed and agreeable to therapy session. Supine>sit HOB partially elevated and using bedrails with supervision. Ambulated ~62ft x2 to/from bathroom using RW with close supervision. Sit<>stand toilet/EOB/w/c<>RW with close supervision throughout session. Pt continent of bladder; performed peri-care and LB clothing management with close supervision for safety. Pt requesting to change shirt and brush teeth - performed seated UB clothing management with set-up assistance and standing oral care at sink with close supervision for balance safety. Pt ambulated 121ft using RW with close supervision. Ascended/descended 8 (4" height) steps using L HR to replicate home environment with step-to pattern and min assist for balance primarily on descent. Pt requesting to return to room for 2nd urination. Transported back to room in w/c. Ambulated x74ft x2 to/from bathroom using RW and again pt continent of small amount of urine - able to perform peri-care and LB clothing management as described above. Therapist provided new ice pack for pain management. Pt reporting increased pain with sitting limiting OOB activity tolerance -  therapist provided cushion for improved pressure relief and pt comfort and educated pt on importance of increasing OOB time. Stand pivot transfer w/c>EOB using RW with  close supervision. Sit>supine with min assist for B LE management. Pt left supine in bed with needs in reach, lines intact, and bed alarm on.   Therapy Documentation Precautions:  Precautions Precautions: Fall Precaution Comments: R neph tube Restrictions Weight Bearing Restrictions: No  Pain: Reports pain level as 9-10/10 in suprapubic area that increases with urination causing a "burning" pain - RN notified and pt had ice pack applied to area throughout session and PT educated pt on ensuring no skin break down from increased ice use.    Therapy/Group: Individual Therapy  Tawana Scale, PT, DPT 04/25/2019, 7:57 AM

## 2019-04-25 NOTE — Progress Notes (Signed)
Social Work Assessment and Plan  Patient Details  Name: Erika Fry MRN: [id number] Date of Birth: 14-Aug-1946  Today's Date: 04/22/2019  Problem List:  Patient Active Problem List   Diagnosis Date Noted  . Hyponatremia   . Acute on chronic anemia   . Diabetes mellitus type 2 in obese ([place])   . Chronic kidney disease   . Hypotension   . Drug induced constipation   . Neurogenic bladder   . Debility 04/21/2019  . Acquired hydronephrosis with ureteropelvic junction (UPJ) obstruction   . Pain in joint involving pelvic region and thigh   . Dysuria   . Palliative care by specialist   . AKI (acute kidney injury) ([place]) 04/10/2019  . Hydronephrosis 04/09/2019  . Interstitial cystitis 04/09/2019  . Bilateral leg edema 02/25/2019  . Chronic heart failure with preserved ejection fraction ([place]) 02/24/2019  . Cardiac pacemaker in situ 02/24/2019  . ARF (acute renal failure) ([place]) 02/10/2019  . Peripheral edema   . Fever 01/17/2019  . Acute lower UTI 01/17/2019  . Pressure injury of skin 01/17/2019  . Gross hematuria 01/14/2019  . Fluid retention   . Generalized edema   . Hypokalemia   . Acute right-sided low back pain with right-sided sciatica   . Acute blood loss anemia   . Anemia of chronic disease   . Thrombocytopenia ([place])   . Diabetes mellitus ([place])   . Morbid obesity ([place])   . Benign essential HTN   . Hypoalbuminemia due to protein-calorie malnutrition ([place])   . Occult blood in stools   . Gastric polyp   . Lumbar discitis 05/17/2018  . Blood loss anemia 05/12/2018  . Hyperlipidemia associated with type 2 diabetes mellitus ([place]) 05/10/2018  . Essential hypertension 04/29/2018  . CKD (chronic kidney disease) stage 4, GFR 15-29 ml/min (H[facility])   . Diet-controlled diabetes mellitus ([place])   . GERD (gastroesophageal reflux disease)   . Gout   . High cholesterol   . Hypothyroidism   . Iron deficiency anemia   . AV block, Mobitz 2 03/25/2018  . Mobitz type 2 second  degree AV block 03/20/2018  . Aortic stenosis 03/20/2018  . Exertional dyspnea 03/19/2018   Past Medical History:  Past Medical History:  Diagnosis Date  . Arthritis    "knees, thumbs" (03/25/2018)  . CKD (chronic kidney disease), stage IV ([place])   . Diet-controlled diabetes mellitus ([place])   . GERD (gastroesophageal reflux disease)   . Gout    "on daily RX" (03/25/2018)  . Heart murmur   . High cholesterol   . Hypertension   . Hypothyroidism   . Iron deficiency anemia    "had to get an iron infusion"  . Migraine    "used to have them growing up" (03/25/2018)  . Presence of permanent cardiac pacemaker 03/25/2018   Past Surgical History:  Past Surgical History:  Procedure Laterality Date  . APPENDECTOMY    . BIOPSY  05/19/2018   Procedure: BIOPSY;  Surgeon: Ladene Artist, MD;  Location: [place];  Service: Endoscopy;;  . Consuela Mimes W/ RETROGRADES Bilateral 01/14/2019   Procedure: CYSTOSCOPY WITH RETROGRADE PYELOGRAM BILATERAL HYDRODISTENTION;  Surgeon: Ardis Hughs, MD;  Location: [facility];  Service: Urology;  Laterality: Bilateral;  . ESOPHAGOGASTRODUODENOSCOPY N/A 05/19/2018   Procedure: ESOPHAGOGASTRODUODENOSCOPY (EGD);  Surgeon: Ladene Artist, MD;  Location: [facility] END[facility]COPY;  Service: Endoscopy;  Laterality: N/A;  . ESOPHAGOGASTRODUODENOSCOPY (EGD) WITH PROPOFOL N/A 01/28/2019   Procedure: ESOPHAGOGASTRODUODENOSCOPY (EGD) WITH PROPOFOL;  Surgeon: Irving Copas.,  MD;  Location: [place];  Service: Gastroenterology;  Laterality: N/A;  . INSERT / REPLACE / REMOVE PACEMAKER  03/25/2018  . IR FLUORO GUIDE CV LINE RIGHT  05/18/2018  . IR LUMBAR DISC ASPIRATION W/IMG GUIDE  05/15/2018  . IR NEPHROSTOMY PLACEMENT RIGHT  04/09/2019  . IR REMOVAL TUN CV CATH W/O FL  07/23/2018  . IR US GUIDE VASC ACCESS RIGHT  05/18/2018  . KNEE ARTHROSCOPY Bilateral   . PACEMAKER IMPLANT N/A 03/25/2018   Procedure: PACEMAKER IMPLANT;  Surgeon: Evans Lance, MD;  Location: [place]  CV LAB;  Service: Cardiovascular;  Laterality: N/A;  . PACEMAKER PLACEMENT Left 03/2018  . POLYPECTOMY  01/28/2019   Procedure: POLYPECTOMY;  Surgeon: Mansouraty, Telford Nab., MD;  Location: [place];  Service: Gastroenterology;;  . RIGHT HEART CATH N/A 03/20/2018   Procedure: RIGHT HEART CATH;  Surgeon: Nigel Mormon, MD;  Location: [place] [facility];  Service: Cardiovascular;  Laterality: N/A;  . TEE WITHOUT CARDIOVERSION N/A 03/20/2018   Procedure: TRANSESOPHAGEAL ECHOCARDIOGRAM (TEE);  Surgeon: Nigel Mormon, MD;  Location: [facility] ENDOSCOPY;  Service: Cardiovascular;  Laterality: N/A;  . TONSILLECTOMY    . URETEROSCOPY WITH HOLMIUM LASER LITHOTRIPSY Right 01/14/2019   Procedure: URETEROSCOPY WITH HOLMIUM LASER LITHOTRIPSY BLADDER BIOPSIES RIGHT STENT PLACEMENT;  Surgeon: Ardis Hughs, MD;  Location: [facility];  Service: Urology;  Laterality: Right;   Social History:  reports that she has never smoked. She has never used smokeless tobacco. She reports previous alcohol use. She reports that she does not use drugs.  Family / Support Systems Marital Status: Widow/Widower How Long?: 3 years Patient Roles: Parent, Other (Comment)(grandparent) Children: Jenny Reichmann ([place]) [place] - oldest son - [phone]) [phone]; Landis Cassaro - middle son - [phone]; Gerald Stabs Advertising account planner) Uriegas - youngest son - [phone] Other Supports: Levan Hurst - dtr-in-law and Brian's wife - [phone] Anticipated Caregiver: sons and thier wifes Ability/Limitations of Caregiver: Jenny Reichmann can arrange 24/7 assist, per Admissions Coordinator.  CSW to confirm this with him closer to d/c, as when CSW asked him if she would have help, he asked if she could be by herself like she was PTA Caregiver Availability: 24/7(CSW to confirm) Family Dynamics: close, supportive family  Social History Preferred language: English Religion: Catholic     Abuse/Neglect Abuse/Neglect Assessment Can Be Completed:  Yes Physical Abuse: Denies Verbal Abuse: Denies Sexual Abuse: Denies Exploitation of patient/patient's resources: Denies Self-Neglect: Denies  Emotional Status Pt's affect, behavior and adjustment status: Pt is very motivated to work hard on [employer] and is grateful that she was able to return after being here last summer.  She became a little tearful thinking of how wonderful and supportive family are. Recent Psychosocial Issues: Pt misses her family.  They have finished fixing up her basement while she's been in the hospital. Psychiatric History: none reported Substance Abuse History: none reported  Patient / Family Perceptions, Expectations & Goals Pt/Family understanding of illness & functional limitations: Pt/family have a good understanding of her condition.   Premorbid pt/family roles/activities: Pt enjoys spending time with her family.  She had gotten back to being independent after her admission on CIR last year. Anticipated changes in roles/activities/participation: Pt would like to resume activities as she is able. Pt/family expectations/goals: Pt wants to get stronger and return to her home with her family.  Community [employer] Agencies: None Premorbid Home Care/DME Agencies: Other (Comment)(Pt has [facility] prior to being hospitalized.  She'd like to use them again  and requested Catalina Antigua, PT and Anda Kraft, Warehouse manager.  Pt has all DME - w/c; rolling walker; single point cane; bedside commode; shower seat) Transportation available at discharge: family  Discharge Planning Living Arrangements: Alone Support Systems: Children, Other relatives, Friends/neighbors, Home care staff Type of Residence: Private residence Insurance Resources: Medicare([employer] Medicare) Financial Resources: Social Security Financial Screen Referred: No Money Management: Patient Does the patient have any problems obtaining your medications?: No Home Management: Pt's family can assist her  with this. Patient/Family Preliminary Plans: Pt plans to return to her home with family providing supervision for bathing, car transfers, ambulation, and stairs. Social Work Anticipated Follow Up Needs: HH/OP Expected length of stay: 7 to 10 days  Clinical Impression CSW met with pt and then spoke with her son, Jenny Reichmann and dtr-in-law, Anderson Malta via telephone to introduce self and role of CSW, as well as to complete assessment.  Pt is so grateful to be on CIR and knows that it will help her to get stronger and get home.  She wants to see Dr. Naaman Plummer, so CSW let him know, as she appreciates his help.  Pt reports great family support and thinks they will provide supervision as she needs it.  CSW to confirm this with pt's family.  Pt was slightly tearful during visit as she talked about how wonderful her family is, but does not endorse any signs/symptoms of depression or anxiety.  CSW will continue to monitor pt and assist as needed.  No current concerns/questions/needs at this time, but CSW will continue to follow.    Josselin Gaulin, Silvestre Mesi 04/25/2019, 3:09 PM

## 2019-04-26 ENCOUNTER — Inpatient Hospital Stay (HOSPITAL_COMMUNITY): Payer: Medicare Other | Admitting: Physical Therapy

## 2019-04-26 ENCOUNTER — Inpatient Hospital Stay (HOSPITAL_COMMUNITY): Payer: Medicare Other

## 2019-04-26 DIAGNOSIS — N3010 Interstitial cystitis (chronic) without hematuria: Secondary | ICD-10-CM

## 2019-04-26 LAB — URINE CULTURE

## 2019-04-26 LAB — GLUCOSE, CAPILLARY
Glucose-Capillary: 85 mg/dL (ref 70–99)
Glucose-Capillary: 121 mg/dL — ABNORMAL HIGH (ref 70–99)
Glucose-Capillary: 115 mg/dL — ABNORMAL HIGH (ref 70–99)
Glucose-Capillary: 115 mg/dL — ABNORMAL HIGH (ref 70–99)

## 2019-04-26 MED ORDER — DARIFENACIN HYDROBROMIDE ER 7.5 MG PO TB24
7.5000 mg | ORAL_TABLET | Freq: Once | ORAL | Status: AC
  Administered 2019-04-26: 12:00:00 7.5 mg via ORAL
  Filled 2019-04-26: qty 1

## 2019-04-26 MED ORDER — DARIFENACIN HYDROBROMIDE ER 15 MG PO TB24
15.0000 mg | ORAL_TABLET | Freq: Every day | ORAL | Status: DC
  Administered 2019-04-27 – 2019-05-01 (×5): 15 mg via ORAL
  Filled 2019-04-26 (×5): qty 1

## 2019-04-26 NOTE — Plan of Care (Signed)
  Problem: Consults Goal: RH GENERAL PATIENT EDUCATION Description See Patient Education module for education specifics. Outcome: Progressing   Problem: RH BOWEL ELIMINATION Goal: RH STG MANAGE BOWEL WITH ASSISTANCE Description STG Manage Bowel with Min.Assistance.  Outcome: Progressing Goal: RH STG MANAGE BOWEL W/MEDICATION W/ASSISTANCE Description STG Manage Bowel with Medication with min. Assistance.  Outcome: Progressing   Problem: RH BLADDER ELIMINATION Goal: RH STG MANAGE BLADDER WITH ASSISTANCE Description STG Manage Bladder With Mod.Assistance  Outcome: Progressing Goal: RH STG MANAGE BLADDER WITH MEDICATION WITH ASSISTANCE Description STG Manage Bladder With Medication With Mod. Assistance.  Outcome: Progressing Goal: RH STG MANAGE BLADDER WITH EQUIPMENT WITH ASSISTANCE Description STG Manage Bladder With Equipment With min. Assistance  Outcome: Progressing   Problem: RH SKIN INTEGRITY Goal: RH STG SKIN FREE OF INFECTION/BREAKDOWN Description With min. Assist.  Outcome: Progressing Goal: RH STG MAINTAIN SKIN INTEGRITY WITH ASSISTANCE Description STG Maintain Skin Integrity With Min.Assistance.  Outcome: Progressing   Problem: RH SAFETY Goal: RH STG ADHERE TO SAFETY PRECAUTIONS W/ASSISTANCE/DEVICE Description STG Adhere to Safety Precautions With Mod. Assistance/Device.  Outcome: Progressing   Problem: RH PAIN MANAGEMENT Goal: RH STG PAIN MANAGED AT OR BELOW PT'S PAIN GOAL Description Less than 3  Outcome: Progressing

## 2019-04-26 NOTE — Progress Notes (Signed)
Physical Therapy Session Note  Patient Details  Name: Erika Fry MRN: [id number] Date of Birth: 22-Jan-1946  Today's Date: 04/26/2019 PT Individual Time: [phone] PT Individual Time Calculation (min): 57 min   Short Term Goals: Week 1:  PT Short Term Goal 1 (Week 1): Pt will ambulate 60' w/ supervision using LRAD PT Short Term Goal 2 (Week 1): Pt will negotiate 2 steps w/ R rail and min assist PT Short Term Goal 3 (Week 1): Pt will perform bed mobility w/ min assist PT Short Term Goal 4 (Week 1): Pt will perform stand pivot transfer w/ supervision  Skilled Therapeutic Interventions/Progress Updates:    Pt supine in bed upon PT arrival, agreeable to therapy tx however reporting 10/10 pain in low abdominal area. Pt requesting pain medicine however pt is not yet due for next dose until 4:30, pt made aware of this. Pt transferred to sitting EOB with supervision, grimacing in pain throughout. Pt performed sit<>stand with supervision and RW, reports the w/c is too painful to sit down in. Pt reports she may try using the bathroom, pt ambulated 2 x 10 ft this session from bed<>toilet with RW and supervision. Pt continent of bladder, pt with burning pain during urination, supervision for clothing management and pericare. Pt provided with new icepack this session to apply for pain relief. Pt ambulated to the sink and washed hands at the sink with supervision for standing balance. Pt tried sitting EOB however she reported it was too painful, transferred back to supine with supervision for pain relief. Pt agreeable to perform supine therex for UE strengthening, 2 x 10 each with therapist providing cues for techniques: chest press with 3# dowel, bicep curls with 3# dowel, and. shoulder flexion with 3# dowel. Pt performed bridging in order to scoot up in bed with min assist. Pt performed LE strengthening exercises with therapist providing cues and assist as needed, x 10 of each: active assisted SLR, hip  abduction, and heel slides. Increased rest breaks needed throughout session secondary to pain management. Pt left supine in bed at end of session with needs in reach and bed alarm set.   Therapy Documentation Precautions:  Precautions Precautions: Fall Precaution Comments: R neph tube Restrictions Weight Bearing Restrictions: No   Therapy/Group: Individual Therapy  Netta Corrigan, PT, DPT 04/26/2019, 12:23 PM

## 2019-04-26 NOTE — Progress Notes (Signed)
[facility] PHYSICAL MEDICINE & REHABILITATION PROGRESS NOTE  Subjective/Complaints: Patient seen preop in bed this morning.  She states she slept well overnight.  She states she had a good week.  ROS: Denies CP, shortness of breath, nausea, vomiting, diarrhea.  Objective: Vital Signs: Blood pressure (!) 142/64, pulse 64, temperature 98.3 F (36.8 C), temperature source Oral, resp. rate 18, height 5\' 6"  (1.676 m), weight 76.3 kg, SpO2 100 %. No results found. No results for input(s): WBC, HGB, HCT, PLT in the last 72 hours. No results for input(s): NA, K, CL, CO2, GLUCOSE, BUN, CREATININE, CALCIUM in the last 72 hours.  Physical Exam: BP (!) 142/64 (BP Location: Right Arm)   Pulse 64   Temp 98.3 F (36.8 C) (Oral)   Resp 18   Ht 5\' 6"  (1.676 m)   Wt 76.3 kg   SpO2 100%   BMI 27.15 kg/m  Constitutional: No distress . Vital signs reviewed.  Obese HENT: Normocephalic.  Atraumatic. Eyes: EOMI.  No discharge. Cardiovascular: No JVD. Respiratory: Normal effort. GI: Non-distended. GU: Right nephrostomy tube Musc: LE>>UE edema, improving Neurological: She is alert and oriented  Motor: Bilateral upper extremities: 5/5 proximal distal Bilateral lower extremities: Hip flexion, knee extension 3-/5, ankle dorsiflexion 4+/5 (left stronger than right)  Skin: Skin is warm and dry. No rash noted. She is not diaphoretic. No erythema.  Psychiatric: She has a normal mood and affect. Her behavior is normal.   Assessment/Plan: 1. Functional deficits secondary to debility which require 3+ hours per day of interdisciplinary therapy in a comprehensive inpatient rehab setting.  Physiatrist is providing close team supervision and 24 hour management of active medical problems listed below.  Physiatrist and rehab team continue to assess barriers to discharge/monitor patient progress toward functional and medical goals  Care Tool:  Bathing    Body parts bathed by patient: Right arm, Left arm,  Chest, Abdomen, Front perineal area, Right upper leg, Left upper leg, Buttocks, Face   Body parts bathed by helper: Right lower leg, Left lower leg     Bathing assist Assist Level: Minimal Assistance - Patient > 75%     Upper Body Dressing/Undressing Upper body dressing   What is the patient wearing?: Pull over shirt    Upper body assist Assist Level: Supervision/Verbal cueing    Lower Body Dressing/Undressing Lower body dressing      What is the patient wearing?: Underwear/pull up, Pants     Lower body assist Assist for lower body dressing: Minimal Assistance - Patient > 75%(reacher)     Toileting Toileting    Toileting assist Assist for toileting: Supervision/Verbal cueing     Transfers Chair/bed transfer  Transfers assist     Chair/bed transfer assist level: Supervision/Verbal cueing     Locomotion Ambulation   Ambulation assist      Assist level: Supervision/Verbal cueing Assistive device: Walker-rolling Max distance: 133ft   Walk 10 feet activity   Assist     Assist level: Supervision/Verbal cueing Assistive device: Walker-rolling   Walk 50 feet activity   Assist Walk 50 feet with 2 turns activity did not occur: Safety/medical concerns  Assist level: Supervision/Verbal cueing Assistive device: Walker-rolling    Walk 150 feet activity   Assist Walk 150 feet activity did not occur: Safety/medical concerns         Walk 10 feet on uneven surface  activity   Assist Walk 10 feet on uneven surfaces activity did not occur: Safety/medical concerns  Wheelchair     Assist Will patient use wheelchair at discharge?: No   Wheelchair activity did not occur: N/A         Wheelchair 50 feet with 2 turns activity    Assist    Wheelchair 50 feet with 2 turns activity did not occur: N/A       Wheelchair 150 feet activity     Assist Wheelchair 150 feet activity did not occur: N/A          Medical  Problem List and Plan: 1.  Functional deficits secondary to debility and pain.   Continue CIR  Weekend notes reviewed- bladder spasms 2.  Antithrombotics: -DVT/anticoagulation:  Mechanical: Sequential compression devices, below knee Bilateral lower extremities             -antiplatelet therapy: N/A 3. Multilevel lumbar spondylosis/Pain Management: Oxycodone/tylenol prn 4. Mood: LCSW to follow for evaluation and support.              -antipsychotic agents: N/A 5. Neuropsych: This patient is capable of making decisions on her own behalf. 6. Skin/Wound Care: routine pressure relief measures.  7. Fluids/Electrolytes/Nutrition: Strict I/O. 1200 cc FR. Added prostat to help with nutritional status. Continue Ensure bid.   8. Chronic interstitial cystitis/Right hydronephrosis: Continue Enablex with oxycodone prn 9. CKD: continue NaHCO3.  Hold Lasix at this time.  Monitor weights daily.  Monitor for signs and symptoms of fluid overload.    Creatinine 4.90 on 5/21  Labs pending  Continue to monitor 10. T2DM: Controlled with diet--will monitor BS ac/hs and use SSI prn for elevated BS. Family bringing food from home.     CBG (last 3)  Recent Labs    04/25/19 1702 04/25/19 2122 04/26/19 0700  GLUCAP 94 121* 85   Labile on 5/25 11.  Hypotension: Ordered TEDs. Encourage OOB to help build endurance.             Orthostatics negative on 5/22 12. Anemia of chronic disease: H/o heme positive stools and has declined GI follow up.              Hemoglobin 7.0 on 5/21, labs pending  Continue to monitor 13. Thrombocytopenia: Resolved  Platelets 245 on 5/21 14. Constipation: Increased Miralax to bid.  Added Colace per patient request.             Improving 15. Neurogenic bladder:   PVRs unremarkable  See #8  UA equivocal, urine culture pending  Enablex increased on 5/25 16.  Hyponatremia  Sodium 132 on 5/21, labs pending  Continue to monitor 17.  Hypoalbuminemia  See #7  LOS: 5 days A FACE  TO FACE EVALUATION WAS PERFORMED  Erika Fry [name] 04/26/2019, 10:25 AM

## 2019-04-26 NOTE — Progress Notes (Signed)
Occupational Therapy Session Note  Patient Details  Name: Erika Fry MRN: [id number] Date of Birth: [birth year]  Today's Date: 04/26/2019 OT Individual Time: 1430-1515 OT Individual Time Calculation (min): 45 min    Short Term Goals: Week 1:  OT Short Term Goal 1 (Week 1): STG=LTG 2/2 ELOS  Skilled Therapeutic Interventions/Progress Updates:    Patient in bathroom with nursing upon therapy arrival and agreeable to participate in OT session. Patient completed BUE strengthening while seated with Level 1 theraband to increase BUE strength and endurance. Focused on shoulder and elbow strengthening; 10X; 1 set each. Rest breaks were taken as needed. Patient reported increased pain/burning in bladder throughout session. Pain was intense at times to stop her from participating mid repetition at times. Attempted to distract patient from pain during session although was unsuccessful. Patient returned to bed at end of session with Min assist for sidelying to supine transition. Bed alarm was on and call light within reach at end of session.   Therapy Documentation Precautions:  Precautions Precautions: Fall Precaution Comments: R neph tube Restrictions Weight Bearing Restrictions: No Pain: Pain Assessment Pain Scale: 0-10 Pain Score: 10-Worst pain ever Pain Type: Acute pain Pain Location: Bladder Pain Orientation: Mid Pain Descriptors / Indicators: Burning Pain Frequency: Constant Pain Onset: On-going Pain Intervention(s): Distraction;Relaxation;Emotional support;Ambulation/increased activity  Therapy/Group: Individual Therapy  Ailene Ravel, OTR/L,CBIS  [phone]  04/26/2019, 4:11 PM

## 2019-04-26 NOTE — Progress Notes (Addendum)
Patient has urgency & frequent urinations She cries when she urinates & says it burns. Nephrostomy tube is draining yellow drainage & was emptied twice on shift. The dressing was changed because it was pulling. There was a Shona Pardo, thick drainage on the old dressing. Area was cleansed with normal saline & a new drainage sponge was applied. A message was sent to the physician for evaluation. Will continue to monitor. Patient also refused her lab work this morning. MD made aware.

## 2019-04-26 NOTE — Progress Notes (Signed)
Physical Therapy Session Note  Patient Details  Name: Erika Fry MRN: [id number] Date of Birth: November 10, 1946  Today's Date: 04/26/2019 PT Individual Time: 0930-1015 PT Individual Time Calculation (min): 45 min   Short Term Goals: Week 1:  PT Short Term Goal 1 (Week 1): Pt will ambulate 41' w/ supervision using LRAD PT Short Term Goal 2 (Week 1): Pt will negotiate 2 steps w/ R rail and min assist PT Short Term Goal 3 (Week 1): Pt will perform bed mobility w/ min assist PT Short Term Goal 4 (Week 1): Pt will perform stand pivot transfer w/ supervision  Skilled Therapeutic Interventions/Progress Updates: Pt presented in bed agreeable to therapy. Pt indicating urgency for urination. Performed supine to sit with use of bed features and supervision assist. Pt ambulated with RW and performed toilet transfers with supervision (+void). Pt then performed hand hygiene at sink with supervision and returned to EOB to receive meds. Pt then ambulated 176f with RW and close supervision then transported remaining distance to day room. Performed ambulatory transfer supervision level with RW to NuStep and pt participated in NuStep L2 x 5 min for global strengthening. Pt returned to w/c and propelled w/c approx 1526ffor BUE strengthening. Pt transported remaining distance back to room and requesting to return to bed. Performed ambulatory transfer to bed and required minA sit to supine for BLE management. Pt able to reposition self to comfort and left with bed alarm on, call bell within reach and needs met.      Therapy Documentation Precautions:  Precautions Precautions: Fall Precaution Comments: R neph tube Restrictions Weight Bearing Restrictions: No General:   Vital Signs:  Pain: Pain Assessment Pain Scale: 0-10 Pain Score: 10-Worst pain ever Pain Type: Acute pain Pain Location: Bladder Pain Orientation: Mid Pain Descriptors / Indicators: Spasm Pain Frequency: Constant Pain Onset:  On-going Pain Intervention(s): Medication (See eMAR);Relaxation;Rest;Emotional support;Distraction   Therapy/Group: Individual Therapy  Juanita Devincent  Joua Bake, PTA  04/26/2019, 12:36 PM

## 2019-04-27 ENCOUNTER — Inpatient Hospital Stay (HOSPITAL_COMMUNITY): Payer: Medicare Other | Admitting: Physical Therapy

## 2019-04-27 ENCOUNTER — Inpatient Hospital Stay (HOSPITAL_COMMUNITY): Payer: Medicare Other | Admitting: Occupational Therapy

## 2019-04-27 DIAGNOSIS — R3989 Other symptoms and signs involving the genitourinary system: Secondary | ICD-10-CM

## 2019-04-27 LAB — GLUCOSE, CAPILLARY
Glucose-Capillary: 90 mg/dL (ref 70–99)
Glucose-Capillary: 110 mg/dL — ABNORMAL HIGH (ref 70–99)
Glucose-Capillary: 93 mg/dL (ref 70–99)
Glucose-Capillary: 103 mg/dL — ABNORMAL HIGH (ref 70–99)

## 2019-04-27 LAB — CBC
HCT: 28.2 % — ABNORMAL LOW (ref 36.0–46.0)
Hemoglobin: 8.9 g/dL — ABNORMAL LOW (ref 12.0–15.0)
MCH: 28.7 pg (ref 26.0–34.0)
MCHC: 31.6 g/dL (ref 30.0–36.0)
MCV: 91 fL (ref 80.0–100.0)
Platelets: 376 10*3/uL (ref 150–400)
RBC: 3.1 MIL/uL — ABNORMAL LOW (ref 3.87–5.11)
RDW: 15.9 % — ABNORMAL HIGH (ref 11.5–15.5)
WBC: 5.5 10*3/uL (ref 4.0–10.5)
nRBC: 0 % (ref 0.0–0.2)

## 2019-04-27 LAB — BASIC METABOLIC PANEL
Anion gap: 11 (ref 5–15)
BUN: 48 mg/dL — ABNORMAL HIGH (ref 8–23)
CO2: 23 mmol/L (ref 22–32)
Calcium: 9.9 mg/dL (ref 8.9–10.3)
Chloride: 98 mmol/L (ref 98–111)
Creatinine, Ser: 3.54 mg/dL — ABNORMAL HIGH (ref 0.44–1.00)
GFR calc Af Amer: 14 mL/min — ABNORMAL LOW (ref >60)
GFR calc non Af Amer: 12 mL/min — ABNORMAL LOW (ref >60)
Glucose, Bld: 109 mg/dL — ABNORMAL HIGH (ref 70–99)
Potassium: 4.9 mmol/L (ref 3.5–5.1)
Sodium: 132 mmol/L — ABNORMAL LOW (ref 135–145)

## 2019-04-27 MED ORDER — GABAPENTIN 100 MG PO CAPS
100.0000 mg | ORAL_CAPSULE | Freq: Three times a day (TID) | ORAL | Status: DC
  Administered 2019-04-27 – 2019-05-01 (×12): 100 mg via ORAL
  Filled 2019-04-27 (×12): qty 1

## 2019-04-27 MED ORDER — LIDOCAINE 5 % EX PTCH
1.0000 | MEDICATED_PATCH | CUTANEOUS | Status: DC
  Administered 2019-04-27: 10:00:00 1 via TRANSDERMAL
  Filled 2019-04-27: qty 1

## 2019-04-27 NOTE — Plan of Care (Signed)
  Problem: Consults Goal: RH GENERAL PATIENT EDUCATION Description See Patient Education module for education specifics. Outcome: Progressing   Problem: RH BOWEL ELIMINATION Goal: RH STG MANAGE BOWEL WITH ASSISTANCE Description STG Manage Bowel with Min.Assistance.  Outcome: Progressing

## 2019-04-27 NOTE — Progress Notes (Signed)
Physical Therapy Session Note  Patient Details  Name: Erika Fry MRN: [id number] Date of Birth: Mar 04, 1946  Today's Date: 04/27/2019 PT Individual Time: 3353-3174 PT Individual Time Calculation (min): 57 min  Missed 18 minutes due to pain   Short Term Goals: Week 1:  PT Short Term Goal 1 (Week 1): Pt will ambulate 64' w/ supervision using LRAD PT Short Term Goal 2 (Week 1): Pt will negotiate 2 steps w/ R rail and min assist PT Short Term Goal 3 (Week 1): Pt will perform bed mobility w/ min assist PT Short Term Goal 4 (Week 1): Pt will perform stand pivot transfer w/ supervision  Skilled Therapeutic Interventions/Progress Updates:    Patient received standing at bedside with nursing staff, agreeable to PT session. Patient ambulating from room to PT gym (~150') with RW with supervision/min guard level assist for safety - close chair follow as patient reports groin pain limiting mobility.   Supine/sidelying ther-ex: bridges 2 x 10, SLR AA 1 x 10, clams 1 x 10 bilaterally with cueing for muscle control and activation as well as overall control of movement to improve hip strength. NuStep L4 x 6 min with B UE/LE for global strengthening and to improve activity tolerance with patient reporting pain throughout.   PT assisting patient return to room with manual w/c. Patient ambulating short distance to restroom with supervision - able to perform LB dressing and pericare without LOB. Able to stand at sink to perform hand hygiene with supervision. Short ambulation to bedside -  Min A for LE management to return to supine. Patient left in bed with call bell near and all needs met.   Therapy Documentation Precautions:  Precautions Precautions: Fall Precaution Comments: R neph tube Restrictions Weight Bearing Restrictions: No General:   Vital Signs: Therapy Vitals Pulse Rate: 68 Resp: 18 BP: (!) 149/85 Patient Position (if appropriate): Lying Oxygen Therapy SpO2: 100 % O2 Device:  Room Air Pain: Pain Assessment Pain Scale: 0-10 Pain Score: 10-Worst pain ever Pain Type: Chronic pain Pain Location: Groin Pain Descriptors / Indicators: Aching;Guarding;Grimacing Pain Frequency: Constant Pain Onset: On-going Pain Intervention(s): Medication (See eMAR);MD notified (Comment);Repositioned;Cold applied;Food    Therapy/Group: Individual Therapy   Lanney Gins, PT, DPT Supplemental Physical Therapist 04/27/19 3:30 PM Pager: [phone] Office: [phone]  Burnard Bunting, PT, DPT

## 2019-04-27 NOTE — Progress Notes (Signed)
[facility] PHYSICAL MEDICINE & REHABILITATION PROGRESS NOTE  Subjective/Complaints: Patient seen sitting up in bed this morning.  She states she did not sleep well overnight due to bladder pain.  ROS: + Bladder pain.  Denies CP, shortness of breath, nausea, vomiting, diarrhea.  Objective: Vital Signs: Blood pressure (!) 143/84, pulse 74, temperature 98.8 F (37.1 C), resp. rate 20, height 5\' 6"  (1.676 m), weight 77.4 kg, SpO2 100 %. No results found. No results for input(s): WBC, HGB, HCT, PLT in the last 72 hours. No results for input(s): NA, K, CL, CO2, GLUCOSE, BUN, CREATININE, CALCIUM in the last 72 hours.  Physical Exam: BP (!) 143/84 (BP Location: Left Arm)   Pulse 74   Temp 98.8 F (37.1 C)   Resp 20   Ht 5\' 6"  (1.676 m)   Wt 77.4 kg   SpO2 100%   BMI 27.54 kg/m  Constitutional: No distress . Vital signs reviewed.  Obese HENT: Normocephalic.  Atraumatic Eyes: EOMI.  No discharge. Cardiovascular: No JVD. Respiratory: Normal effort. GI: Non-distended. GU: Right nephrostomy tube Musc: LE>>UE edema, improving Neurological: She is alert and oriented  Motor:  Bilateral lower extremities: Hip flexion, knee extension 3-/5, ankle dorsiflexion 4+/5 (left stronger than right), unchanged Skin: Skin is warm and dry. No rash noted. She is not diaphoretic. No erythema.  Psychiatric: She has a normal mood and affect. Her behavior is normal.   Assessment/Plan: 1. Functional deficits secondary to debility which require 3+ hours per day of interdisciplinary therapy in a comprehensive inpatient rehab setting.  Physiatrist is providing close team supervision and 24 hour management of active medical problems listed below.  Physiatrist and rehab team continue to assess barriers to discharge/monitor patient progress toward functional and medical goals  Care Tool:  Bathing    Body parts bathed by patient: Right arm, Left arm, Chest, Abdomen, Front perineal area, Right upper leg,  Left upper leg, Buttocks, Face   Body parts bathed by helper: Right lower leg, Left lower leg     Bathing assist Assist Level: Minimal Assistance - Patient > 75%     Upper Body Dressing/Undressing Upper body dressing   What is the patient wearing?: Pull over shirt    Upper body assist Assist Level: Supervision/Verbal cueing    Lower Body Dressing/Undressing Lower body dressing      What is the patient wearing?: Underwear/pull up, Pants     Lower body assist Assist for lower body dressing: Minimal Assistance - Patient > 75%(reacher)     Toileting Toileting    Toileting assist Assist for toileting: Supervision/Verbal cueing     Transfers Chair/bed transfer  Transfers assist     Chair/bed transfer assist level: Supervision/Verbal cueing     Locomotion Ambulation   Ambulation assist      Assist level: Supervision/Verbal cueing Assistive device: Walker-rolling Max distance: 191ft   Walk 10 feet activity   Assist     Assist level: Supervision/Verbal cueing Assistive device: Walker-rolling   Walk 50 feet activity   Assist Walk 50 feet with 2 turns activity did not occur: Safety/medical concerns  Assist level: Supervision/Verbal cueing Assistive device: Walker-rolling    Walk 150 feet activity   Assist Walk 150 feet activity did not occur: Safety/medical concerns  Assist level: Supervision/Verbal cueing Assistive device: Walker-rolling    Walk 10 feet on uneven surface  activity   Assist Walk 10 feet on uneven surfaces activity did not occur: Safety/medical concerns         Wheelchair  Assist Will patient use wheelchair at discharge?: No   Wheelchair activity did not occur: N/A         Wheelchair 50 feet with 2 turns activity    Assist    Wheelchair 50 feet with 2 turns activity did not occur: N/A       Wheelchair 150 feet activity     Assist Wheelchair 150 feet activity did not occur: N/A           Medical Problem List and Plan: 1.  Functional deficits secondary to debility and pain.   Continue CIR 2.  Antithrombotics: -DVT/anticoagulation:  Mechanical: Sequential compression devices, below knee Bilateral lower extremities             -antiplatelet therapy: N/A 3. Multilevel lumbar spondylosis/Pain Management: Oxycodone/tylenol prn  Cont local measures  Lidoderm patch ordered 4. Mood: LCSW to follow for evaluation and support.              -antipsychotic agents: N/A 5. Neuropsych: This patient is capable of making decisions on her own behalf. 6. Skin/Wound Care: routine pressure relief measures.  7. Fluids/Electrolytes/Nutrition: Strict I/O. 1200 cc FR. Added prostat to help with nutritional status. Continue Ensure bid.   8. Chronic interstitial cystitis/Right hydronephrosis: Continue Enablex with oxycodone prn 9. CKD: continue NaHCO3.  Hold Lasix at this time.  Monitor weights daily.  Monitor for signs and symptoms of fluid overload.    Creatinine 4.90 on 5/21  Labs remain pending  Continue to monitor 10. T2DM: Controlled with diet--will monitor BS ac/hs and use SSI prn for elevated BS. Family bringing food from home.  CBG (last 3)  Recent Labs    04/26/19 1652 04/26/19 2118 04/27/19 0559  GLUCAP 115* 115* 90   Relatively controlled on 5/26 11.  Hypotension: Ordered TEDs. Encourage OOB to help build endurance.             Orthostatics negative on 5/22  Blood pressure elevated on 5/26 12. Anemia of chronic disease: H/o heme positive stools and has declined GI follow up.              Hemoglobin 7.0 on 5/21, labs remain pending. Discussed with PA.  Continue to monitor 13. Thrombocytopenia: Resolved  Platelets 245 on 5/21 14. Constipation: Increased Miralax to bid.  Added Colace per patient request.             Improving 15. Neurogenic bladder:   PVRs unremarkable  See #8  UA equivocal, urine culture with multiple species, reordered  Enablex increased on  5/25  See #3, #8 16.  Hyponatremia  Sodium 132 on 5/21, labs remain pending  Continue to monitor 17.  Hypoalbuminemia  See #7  LOS: 6 days A FACE TO FACE EVALUATION WAS PERFORMED   [name] 04/27/2019, 9:01 AM

## 2019-04-27 NOTE — Progress Notes (Signed)
Physical Therapy Session Note  Patient Details  Name: Erika Fry MRN: [id number] Date of Birth: 1946-02-28  Today's Date: 04/27/2019 PT Individual Time: 0805-0916 PT Individual Time Calculation (min): 71 min   Short Term Goals: Week 1:  PT Short Term Goal 1 (Week 1): Pt will ambulate 79' w/ supervision using LRAD PT Short Term Goal 2 (Week 1): Pt will negotiate 2 steps w/ R rail and min assist PT Short Term Goal 3 (Week 1): Pt will perform bed mobility w/ min assist PT Short Term Goal 4 (Week 1): Pt will perform stand pivot transfer w/ supervision  Skilled Therapeutic Interventions/Progress Updates:    Patient received standing in room with nursing staff present. Pt stating "I want to do physical therapy, but I just wish I wasn't in so much pain." Pt reporting pain level 10/10 with nursing staff administering pain medication. Pt agreeable to performing bed level exercises and progressing OOB based on pain tolerance - pt continuously verbalizing pain throughout session with many rest breaks provided for pain management. Ambulated ~42ft to bed using RW and stand>sit with supervision. Sit>supine with min assist for B LE management. Performed supine B LE exercises 2x10 repetitions of the following: ankle PF/DF, heel slides, SAQ, active assisted SLR - cuing provided throughout for proper technique/form. Therapist donned B LE TED hose max assist. Pt requesting to use bathroom. Supine>sit with CGA for trunk support. Sit<>stand EOB/BSC over toilet<>RW with supervision. Ambulated ~51ft x2 to/from bathroom using RW with supervision. Performed standing peri-care and hand hygiene with set-up assist and supervision for balance. Therapist provided fresh ice for pt comfort; however, educated pt on the risk of having ice to skin for prolonged periods of time but pt refused to go without ice. Sit>supine with min assist for B LE management. Performed supine B UE arm exercises using 4lb dowel 2x10 repetitions:  chest presses, bicep curls, and shoulder flexion - cuing throughout for proper technique/form. Pt left supine in bed with needs in reach, lines intact, and bed alarm on.   Therapy Documentation Precautions:  Precautions Precautions: Fall Precaution Comments: R neph tube Restrictions Weight Bearing Restrictions: No  Pain: Reports pain as 10/10 with additional details above - RN administering medications at beginning of session and therapy performed to tolerance .   Therapy/Group: Individual Therapy  Tawana Scale, PT, DPT 04/27/2019, 7:42 AM

## 2019-04-27 NOTE — Progress Notes (Signed)
Occupational Therapy Session Note  Patient Details  Name: Erika Fry MRN: [id number] Date of Birth: 1946/09/29  Today's Date: 04/27/2019 OT Individual Time: 0223-3612 OT Individual Time Calculation (min): 9 min   Short Term Goals: Week 1:  OT Short Term Goal 1 (Week 1): STG=LTG 2/2 ELOS  Skilled Therapeutic Interventions/Progress Updates:    Pt greeted standing at the sink with nursing after going to the bathroom. Pt stood w/ RW at the sink with supervision. Encouraged pt to participate in BADL tasks, but pt stated she was in too much pain and needed to lay back down. Pt ambulated back to bed w/ RW and supervision. Increased time to sit 2/2 bladder pain. Encouraged pt to try to lift LEs into bed today, but pt stated "I can't" and needed OT assist to lift LEs. Pt reports bladder pain is still 10/10. Pt missed 36 mins of skilled OT treatment- will follow up per plan of care.   Therapy Documentation Precautions:  Precautions Precautions: Fall Precaution Comments: R neph tube Restrictions Weight Bearing Restrictions: No General: General OT Amount of Missed Time: 36 Minutes 2/2 pain Pain: Pain Assessment Pain Scale: 0-10 Pain Score: 10-Worst pain ever Pain Type: Chronic pain Pain Location: Bladder Pain Descriptors / Indicators: Burning;Spasm Pain Frequency: Constant Pain Onset: On-going Pain Intervention(s): Repositioned   Therapy/Group: Individual Therapy  Valma Cava 04/27/2019, 10:46 AM

## 2019-04-27 NOTE — Plan of Care (Signed)
Conference 05/27

## 2019-04-28 ENCOUNTER — Inpatient Hospital Stay (HOSPITAL_COMMUNITY): Payer: Medicare Other

## 2019-04-28 ENCOUNTER — Inpatient Hospital Stay (HOSPITAL_COMMUNITY): Payer: Medicare Other | Admitting: Physical Therapy

## 2019-04-28 DIAGNOSIS — R0989 Other specified symptoms and signs involving the circulatory and respiratory systems: Secondary | ICD-10-CM

## 2019-04-28 LAB — GLUCOSE, CAPILLARY
Glucose-Capillary: 82 mg/dL (ref 70–99)
Glucose-Capillary: 106 mg/dL — ABNORMAL HIGH (ref 70–99)
Glucose-Capillary: 84 mg/dL (ref 70–99)
Glucose-Capillary: 88 mg/dL (ref 70–99)

## 2019-04-28 LAB — URINE CULTURE

## 2019-04-28 MED ORDER — TIZANIDINE HCL 2 MG PO TABS
2.0000 mg | ORAL_TABLET | Freq: Every day | ORAL | Status: DC
  Administered 2019-04-30: 23:00:00 2 mg via ORAL
  Filled 2019-04-28: qty 1

## 2019-04-28 NOTE — Progress Notes (Addendum)
Patient has been awake all night. Pt requesting to be toileting every 15-30 minutes. Constant complaints of urinary pain, burning, and urgency. Pt medicated with tylenol x3, oxycodone x3, and urispas x2 with minimal to no effects. Given trazodone 25 mg po at hs with no effects noted. Patient refused her labs this am.

## 2019-04-28 NOTE — Progress Notes (Signed)
Physical Therapy Session Note  Patient Details  Name: Erika Fry MRN: [id number] Date of Birth: 12/10/1945  Today's Date: 04/28/2019 PT Individual Time: 1303-1345 PT Individual Time Calculation (min): 42 min   Short Term Goals: Week 1:  PT Short Term Goal 1 (Week 1): Pt will ambulate 25' w/ supervision using LRAD PT Short Term Goal 2 (Week 1): Pt will negotiate 2 steps w/ R rail and min assist PT Short Term Goal 3 (Week 1): Pt will perform bed mobility w/ min assist PT Short Term Goal 4 (Week 1): Pt will perform stand pivot transfer w/ supervision  Skilled Therapeutic Interventions/Progress Updates: Pt presented in bed agreeable to therapy. Pt continues to c/o pain with "spasms" at bladder, pt recently premedicated. Performed bed mobility with supervision and bed features. Pt performed ambulation to bathroom and toilet transfers at distant supervision with mod I peri-care. Pt then ambulated to sink to perform hand hygiene and then ambulated to w/c. Pt transported in w/c to rehab gym and participated in seated and standing therex as follows:  LAQ 2x10 Hamstring pulls with level 2 resistance 2 x 10 Hip ER level 2 resistance band 2 x 10 Standing hip abd with level 2 resistance band at ankles 2 x10 Standing hamstring curls 2 x 10 Mini squats x 10  Pt then propelled back to room distant supervision and requesting to use toilet prior to returning to bed. Pt performed ambulatory transfer to toilet with supervision toilet transfers. Pt left at toilet with NT present and needs met.       Therapy Documentation Precautions:  Precautions Precautions: Fall Precaution Comments: R neph tube Restrictions Weight Bearing Restrictions: No General:   Vital Signs: Therapy Vitals Temp: 98.1 F (36.7 C) Temp Source: Oral Pulse Rate: 75 Resp: 18 BP: (!) 118/99 Patient Position (if appropriate): Lying Oxygen Therapy SpO2: 100 % O2 Device: Room Air Pain: Pain Assessment Pain Scale:  0-10 Pain Score: 8  Pain Type: Acute pain Pain Location: Bladder Pain Descriptors / Indicators: Burning;Spasm Pain Frequency: Constant Pain Onset: On-going Pain Intervention(s): Medication (See eMAR);Cold applied    Therapy/Group: Individual Therapy  [name]  [name], PTA  04/28/2019, 3:46 PM  

## 2019-04-28 NOTE — Progress Notes (Signed)
Erika Fry PHYSICAL MEDICINE & REHABILITATION PROGRESS NOTE  Subjective/Complaints: Patient seen sitting up in bed this morning.  She states she did not sleep well overnight due to bladder pain.  She denies improvement with Lidoderm patch.  ROS: + Bladder pain, persistent.  Denies CP, shortness of breath, nausea, vomiting, diarrhea.  Objective: Vital Signs: Blood pressure (!) 146/73, pulse 74, temperature 97.8 F (36.6 C), temperature source Oral, resp. rate 18, height 5\' 6"  (1.676 m), weight 72.8 kg, SpO2 100 %. No results found. Recent Labs    04/27/19 1029  WBC 5.5  HGB 8.9*  HCT 28.2*  PLT 376   Recent Labs    04/27/19 1029  NA 132*  K 4.9  CL 98  CO2 23  GLUCOSE 109*  BUN 48*  CREATININE 3.54*  CALCIUM 9.9    Physical Exam: BP (!) 146/73 (BP Location: Left Arm)   Pulse 74   Temp 97.8 F (36.6 C) (Oral)   Resp 18   Ht 5\' 6"  (1.676 m)   Wt 72.8 kg   SpO2 100%   BMI 25.90 kg/m  Constitutional: No distress . Vital signs reviewed.  Obese HENT: Normocephalic.  Atraumatic. Eyes: EOMI.  No discharge. Cardiovascular: No JVD. Respiratory: Normal effort. GI: Non-distended. GU: Right nephrostomy tube, draining clear liquid Musc: LE>>UE edema, stable Neurological: She is alert and oriented  Motor:  Bilateral lower extremities: Hip flexion, knee extension 3-/5, ankle dorsiflexion 4+/5 (left stronger than right, pain inhibition) Skin: Skin is warm and dry. No rash noted. She is not diaphoretic. No erythema.  Psychiatric: She has a normal mood and affect. Her behavior is normal.   Assessment/Plan: 1. Functional deficits secondary to debility which require 3+ hours per day of interdisciplinary therapy in a comprehensive inpatient rehab setting.  Physiatrist is providing close team supervision and 24 hour management of active medical problems listed below.  Physiatrist and rehab team continue to assess barriers to discharge/monitor patient progress toward functional  and medical goals  Care Tool:  Bathing    Body parts bathed by patient: Right arm, Left arm, Chest, Abdomen, Front perineal area, Right upper leg, Left upper leg, Buttocks, Face   Body parts bathed by helper: Right lower leg, Left lower leg     Bathing assist Assist Level: Minimal Assistance - Patient > 75%     Upper Body Dressing/Undressing Upper body dressing   What is the patient wearing?: Pull over shirt    Upper body assist Assist Level: Supervision/Verbal cueing    Lower Body Dressing/Undressing Lower body dressing      What is the patient wearing?: Underwear/pull up, Pants     Lower body assist Assist for lower body dressing: Minimal Assistance - Patient > 75%(reacher)     Toileting Toileting    Toileting assist Assist for toileting: Supervision/Verbal cueing     Transfers Chair/bed transfer  Transfers assist     Chair/bed transfer assist level: Supervision/Verbal cueing     Locomotion Ambulation   Ambulation assist      Assist level: Supervision/Verbal cueing Assistive device: Walker-rolling Max distance: 150   Walk 10 feet activity   Assist     Assist level: Supervision/Verbal cueing Assistive device: Walker-rolling   Walk 50 feet activity   Assist Walk 50 feet with 2 turns activity did not occur: Safety/medical concerns  Assist level: Supervision/Verbal cueing Assistive device: Walker-rolling    Walk 150 feet activity   Assist Walk 150 feet activity did not occur: Safety/medical concerns  Assist level:  Supervision/Verbal cueing Assistive device: Walker-rolling    Walk 10 feet on uneven surface  activity   Assist Walk 10 feet on uneven surfaces activity did not occur: Safety/medical concerns         Wheelchair     Assist Will patient use wheelchair at discharge?: No   Wheelchair activity did not occur: N/A         Wheelchair 50 feet with 2 turns activity    Assist    Wheelchair 50 feet with 2  turns activity did not occur: N/A       Wheelchair 150 feet activity     Assist Wheelchair 150 feet activity did not occur: N/A          Medical Problem List and Plan: 1.  Functional deficits secondary to debility and pain.   Continue CIR  Team conference today to discuss current and goals and coordination of care, home and environmental barriers, and discharge planning with nursing, case manager, and therapies.  2.  Antithrombotics: -DVT/anticoagulation:  Mechanical: Sequential compression devices, below knee Bilateral lower extremities             -antiplatelet therapy: N/A 3. Multilevel lumbar spondylosis/Pain Management: Oxycodone/tylenol prn  Cont local measures  Lidoderm patch ordered, no benefit, DC'd 4. Mood: LCSW to follow for evaluation and support.              -antipsychotic agents: N/A 5. Neuropsych: This patient is capable of making decisions on her own behalf. 6. Skin/Wound Care: routine pressure relief measures.  7. Fluids/Electrolytes/Nutrition: Strict I/O. 1200 cc FR. Added prostat to help with nutritional status. Continue Ensure bid.   8. Chronic interstitial cystitis/Right hydronephrosis: Continue Enablex with oxycodone prn  Trial tizanidine 2 mg nightly started on 5/27-need to be extremely careful given hypersensitivity to baclofen on previous admission.  She was able to tolerate tizanidine. 9. CKD: continue NaHCO3.  Hold Lasix at this time.  Monitor weights daily.  Monitor for signs and symptoms of fluid overload.    Creatinine 3.54 on 5/26  Continue to monitor 10. T2DM: Controlled with diet--will monitor BS ac/hs and use SSI prn for elevated BS. Family bringing food from home.  CBG (last 3)  Recent Labs    04/27/19 1711 04/27/19 2051 04/28/19 0557  GLUCAP 93 103* 82   Relatively controlled on 5/27 11.  Hypotension: Ordered TEDs. Encourage OOB to help build endurance.             Orthostatics negative on 5/22  Blood pressure slightly elevated on  5/27, likely pain contribution 12. Anemia of chronic disease: H/o heme positive stools and has declined GI follow up.              Hemoglobin 8.9 on 5/26.  Continue to monitor 13. Thrombocytopenia: Resolved 14. Constipation: Increased Miralax to bid.  Added Colace per patient request.             Improving 15. Neurogenic bladder:   PVRs unremarkable  See #8  UA equivocal, urine culture with multiple species, reordered-multiple species again  Enablex increased on 5/25  See #3, #8 16.  Hyponatremia  Sodium 132 on 5/26  Continue to monitor 17.  Hypoalbuminemia  See #7  LOS: 7 days A FACE TO FACE EVALUATION WAS PERFORMED  Erika Fry 04/28/2019, 9:06 AM

## 2019-04-28 NOTE — Progress Notes (Signed)
Nutrition Follow-up  RD working remotely.  DOCUMENTATION CODES:   Not applicable  INTERVENTION:  - Continue Ensure Enlive po BID, each supplement provides 350 kcal and 20 grams of protein  - d/c Pro-stat  - Continue MVI with minerals daily  NUTRITION DIAGNOSIS:   Increased nutrient needs related to other (therapies) as evidenced by estimated needs.  Ongoing, being addressed via oral nutrition supplements  GOAL:   Patient will meet greater than or equal to 90% of their needs  Progressing  MONITOR:   PO intake, Supplement acceptance, Labs, Weight trends  REASON FOR ASSESSMENT:   Malnutrition Screening Tool    ASSESSMENT:   73 year old female with PMH of chronic interstitial cystitis, chronic pelvic pain, history of lumbar discitis treated with prolonged anitibiotics, CKD stage IV due to HTN, T2DM on insulin, complete heart block s/p dual chamber pacemaker, CHF, hypothyroidism, and anemia. Pt presented on 04/09/19 from IR where she was there for right nephrostomy tube placement and found to have AKI as well as severe persistent pain so admitted. Obstruction due to interstitial cystitis and scarring. Nephrostomy tube draining well. Palliative care consulted to assist with pain management. Pt admitted to CIR on 5/20.  Weight down 9 lbs since admission to CIR, down 10 lbs overnight. Suspect weight of 160 lbs this morning related to scale error given pt had been maintaining between 166-170 lbs since admit.  Reviewed RN edema assessment. Pt with non-pitting edema to BUE and moderate pitting edema to BLE.  Spoke with pt via phone call to room. Pt states she is doing well, pain is "on and off" but reports she is "hurting bad right now." Pt states her appetite is "fine" and that she is looking at the menu to see what she wants for lunch.  Pt states that the food at [facility] is very good and that she is really enjoying her meals.  Pt reports she is drinking her Ensure and has one  right now that she is sipping on. RD encouraged continued adequate po intake and supplement intake.  Meal Completion: 50-100% x last 8 recorded meals (averaging ~75%)  Medications reviewed and include: Colace, Ensure Enlive BID, Pro-stat 30 ml BID (pt refusing), ferrous sulfate, folic acid, SSI, MVI with minerals daily, Protonix, Miralax, vitamin B-12  Labs reviewed: sodium 132 (L), BUN 48 (H), creatinine 3.54 (H), hemoglobin 8.9 (L) CBG's: 82, 103, 93, 110 x 24 hours  UOP: 1075 ml x 24 hours I/O's: -2.9 L since admit  Diet Order:   Diet Order            Diet heart healthy/carb modified Room service appropriate? Yes; Fluid consistency: Thin; Fluid restriction: 1200 mL Fluid  Diet effective now              EDUCATION NEEDS:   Education needs have been addressed  Skin:  Skin Assessment:: Skin Integrity Issues: Other: burn to buttocks and inner thighs from heating pad PTA  Last BM:  04/27/19 small type 6  Height:   Ht Readings from Last 1 Encounters:  04/21/19 5\' 6"  (1.676 m)    Weight:   Wt Readings from Last 1 Encounters:  04/28/19 72.8 kg    Ideal Body Weight:  59.1 kg  BMI:  Body mass index is 25.9 kg/m.  Estimated Nutritional Needs:   Kcal:  1650-1850  Protein:  75-90 grams  Fluid:  1.7-1.9 L    Gaynell Face, MS, RD, LDN Inpatient Clinical Dietitian Pager: [phone] Weekend/After Hours: [phone]

## 2019-04-28 NOTE — Progress Notes (Signed)
Occupational Therapy Session Note  Patient Details  Name: Erika Fry MRN: [id number] Date of Birth: [birth year]  Today's Date: 04/28/2019 OT Individual Time: 0830-0930 OT Individual Time Calculation (min): 60 min    Short Term Goals: Week 1:  OT Short Term Goal 1 (Week 1): STG=LTG 2/2 ELOS  Skilled Therapeutic Interventions/Progress Updates:   Pt received in bathroom with nursing. (S) provided during ambulatory transfer out of bathroom. Pt c/o bladder pain throughout session, pausing for "spasms" to pass. Pt stood at EOB and completed UB bathing and dressing with set up assist, (S) for balance. Pt completed LB bathing with CGA- sit> stand. Min A required to thread pants over BLE. Pt able to pull up in standing with (S).  Pt completed 272 feet of functional mobility with RW, close (S), frequent rest breaks in standing for no more than a minute each. Pt returned to room via w/c d/t fatigue. Pt completed brief BUE strengthening circuit with level 1 theraband. Constant demo/instruction provided. Pt left sitting up in w/c with pain still lingering in bladder- ice pack in place and pt not up for any further pain medication- all needs within reach.   Therapy Documentation Precautions:  Precautions Precautions: Fall Precaution Comments: R neph tube Restrictions Weight Bearing Restrictions: No   Therapy/Group: Individual Therapy  Curtis Sites 04/28/2019, 9:13 AM

## 2019-04-28 NOTE — Plan of Care (Signed)
  Problem: Consults Goal: RH GENERAL PATIENT EDUCATION Description See Patient Education module for education specifics. Outcome: Progressing   Problem: RH BOWEL ELIMINATION Goal: RH STG MANAGE BOWEL WITH ASSISTANCE Description STG Manage Bowel with Min.Assistance.  Outcome: Progressing Goal: RH STG MANAGE BOWEL W/MEDICATION W/ASSISTANCE Description STG Manage Bowel with Medication with min assistance    Outcome: Progressing   Problem: RH BLADDER ELIMINATION Goal: RH STG MANAGE BLADDER WITH ASSISTANCE Description STG Manage Bladder With Mod.Assistance  Outcome: Progressing

## 2019-04-28 NOTE — Progress Notes (Signed)
Physical Therapy Session Note  Patient Details  Name: Erika Fry MRN: [id number] Date of Birth: September 29, 1946  Today's Date: 04/28/2019 PT Individual Time: 1435-1533 PT Individual Time Calculation (min): 58 min   Short Term Goals: Week 1:  PT Short Term Goal 1 (Week 1): Pt will ambulate 52' w/ supervision using LRAD PT Short Term Goal 2 (Week 1): Pt will negotiate 2 steps w/ R rail and min assist PT Short Term Goal 3 (Week 1): Pt will perform bed mobility w/ min assist PT Short Term Goal 4 (Week 1): Pt will perform stand pivot transfer w/ supervision  Skilled Therapeutic Interventions/Progress Updates:  Pt received in room & agreeable to tx. Pt ambulates around unit with RW & supervision. Pt reports she has a stable recliner she will sit in at home so pt completed transfer from recliner in apartment with supervision to simulate her home. Pt performs BLE standing therapeutic exercises for strengthening: marches, heel raises, and mini squats all 2 sets x 15 reps each. Pt negotiates 8 steps, 3" tall as pt report this is the height of her step at home, with R ascending rail & supervision with pt intermittently leaning on L rail with LUE when ascending stairs and pt demonstrating step-to pattern. While standing with supervision pt holds 2 kg ball with BUE & performs PNF patterns 2 sets x 10 each with task focusing on standing balance & BUE, core strengthening. Pt ambulates gym>room with RW & supervision at end of session with multiple standing breaks 2/2 spasms and significnatly decreased gait speed. At end of session pt reports need to use restroom and pt transfers onto toilet with distant supervision. Pt left sitting on toilet with call bell in reach & instructions to call nursing staff when finished & NT aware of pt's location.  Provided information on ice pack maxi pads pt could purchase to use at home as she reports hospital ice packs are too bulky & uncomfortable while sitting.    Therapy  Documentation Precautions:  Precautions Precautions: Fall Precaution Comments: R neph tube Restrictions Weight Bearing Restrictions: No   Pain: Pt c/o unrated burning & spasms in bladder area - pt using ice pack during session & rest breaks/repositioning provided. Pt unable to receive pain meds until 4:30pm.    Therapy/Group: Individual Therapy  Waunita Schooner 04/28/2019, 3:39 PM

## 2019-04-28 NOTE — Progress Notes (Signed)
Physical Therapy Session Note  Patient Details  Name: Erika Fry MRN: [id number] Date of Birth: December 23, 1945  Today's Date: 04/28/2019 PT Individual Time: 1000-1058 PT Individual Time Calculation (min): 58 min   Short Term Goals: Week 1:  PT Short Term Goal 1 (Week 1): Pt will ambulate 107' w/ supervision using LRAD PT Short Term Goal 2 (Week 1): Pt will negotiate 2 steps w/ R rail and min assist PT Short Term Goal 3 (Week 1): Pt will perform bed mobility w/ min assist PT Short Term Goal 4 (Week 1): Pt will perform stand pivot transfer w/ supervision  Skilled Therapeutic Interventions/Progress Updates:   Pt in supine and agreeable to therapy, pain as detailed below however pt talking and conversing with this therapist w/ no overt evidence of pain. Bed mobility w/ supervision and ambulated to/from toilet w/ supervision using RW, toilet transfer w/ supervision as well. Discussed readiness for d/c, including need for supervision at home w/ all mobility for safety. Pt states her son and daughter-in-law live close by and can arrange for that, pt's brother also lives a few blocks away. Stood at sink w/ supervision to wash hands. Ambulated to therapy gym w/ supervision using RW, >150'. Practiced negotiating steps as per home set-up, negotiated three inch steps w/ R rail, CGA x8 steps for practice. Pt self-propelled w/c back to room w/ supervision using BUEs to work on endurance training. 2nd toilet transfer once back into room w/ supervision, replaced ice pack in underwear per pt's request for pain relief. Ended session in supine, all needs in reach.    Therapy Documentation Precautions:  Precautions Precautions: Fall Precaution Comments: R neph tube Restrictions Weight Bearing Restrictions: No Pain: Pain Assessment Pain Scale: 0-10 Pain Score: 10-Worst pain ever Pain Type: Acute pain Pain Location: Bladder Pain Frequency: Constant Pain Onset: On-going Pain Intervention(s): Medication  (See eMAR);Cold applied  Therapy/Group: Individual Therapy  Jawana Reagor K Anabella Capshaw 04/28/2019, 10:59 AM

## 2019-04-28 NOTE — Progress Notes (Signed)
Education provided on emptying drain for home use. Will have pt perform with teach back

## 2019-04-29 ENCOUNTER — Inpatient Hospital Stay (HOSPITAL_COMMUNITY): Payer: Medicare Other | Admitting: Occupational Therapy

## 2019-04-29 ENCOUNTER — Inpatient Hospital Stay (HOSPITAL_COMMUNITY): Payer: Medicare Other | Admitting: Physical Therapy

## 2019-04-29 ENCOUNTER — Inpatient Hospital Stay (HOSPITAL_COMMUNITY): Payer: Medicare Other

## 2019-04-29 LAB — GLUCOSE, CAPILLARY
Glucose-Capillary: 90 mg/dL (ref 70–99)
Glucose-Capillary: 93 mg/dL (ref 70–99)
Glucose-Capillary: 97 mg/dL (ref 70–99)
Glucose-Capillary: 98 mg/dL (ref 70–99)

## 2019-04-29 MED ORDER — OXYBUTYNIN CHLORIDE 5 MG PO TABS: 5.0000 mg | ORAL_TABLET | Freq: Every day | ORAL | Status: DC | PRN

## 2019-04-29 NOTE — Progress Notes (Signed)
Physical Therapy Session Note  Patient Details  Name: Erika Fry MRN: [id number] Date of Birth: March 25, 1946  Today's Date: 04/29/2019 PT Individual Time: 1031-1115 PT Individual Time Calculation (min): 44 min   Short Term Goals: Week 1:  PT Short Term Goal 1 (Week 1): Pt will ambulate 13' w/ supervision using LRAD PT Short Term Goal 2 (Week 1): Pt will negotiate 2 steps w/ R rail and min assist PT Short Term Goal 3 (Week 1): Pt will perform bed mobility w/ min assist PT Short Term Goal 4 (Week 1): Pt will perform stand pivot transfer w/ supervision   Therapy Documentation Precautions:  Precautions Precautions: Fall Precaution Comments: R neph tube Restrictions Weight Bearing Restrictions: No  Treatment: Pt on the way to the restroom with RN on arrival to room. PTA assist pt onto [facility] over toilet. Pt continues to report bladder and perineum pain. RN aware and pt has been premedicated. Pt also continues to use ice pack in her groin area.  Pt supervision to sit to elevated toilet with UE assist. Pt also independent with peri care once done in bathroom, supervision to stand from [facility] over toilet. Fresh ice provided for pt's ice pack while in bathroom. supervision for gait from bathroom to sink where pt washed her hands with no support, supervision only. Pt then walked across room to sit in wheelchair for RN to administer medication. Gait in hallway with RW from room to gym (zone 3) with supervision, wheelchair follow for if fatigued only (not needed). In gym worked on strengthening via step ups to 6 inch step with bil UE support for 10 reps each leg, min guard to min assist for balance. Cues on technique, form and to decrease UE reliance on RW. Then after a rest break progressed to working on balance/strengtheing combined via double stand on airex with light UE support on RW- heel/toe raises and alternating high knee marching. Cues on posture, weight shifting and decreased reliance on  walker. After a rest break the pt walked from gym back to her room with RW and supervision. Once back at room pt wanting to go back to bed. Supervision to sit at edge of bed. Min assist for return to supine due to pt needed assistance to left LE's onto bed surface with bed flat and no rails used.  Pt left in bed with HOB 35 degrees, bed alarm on and all needs in reach. The pt continues to progress with activity tolerance despite continued bladder/perineum pain.      Therapy/Group: Individual Therapy  Lindon Romp, PTA, CLT 04/29/19, 8:58 PM

## 2019-04-29 NOTE — Progress Notes (Signed)
Occupational Therapy Session Note  Patient Details  Name: Erika Fry MRN: [id number] Date of Birth: [birth year]  Today's Date: 04/29/2019  Session 1 OT Individual Time: 0752-0830 OT Individual Time Calculation (min): 38 min   Session 2 OT Individual Time: 9629-5284 OT Individual Time Calculation (min): 79 min    Short Term Goals: Week 1:  OT Short Term Goal 1 (Week 1): STG=LTG 2/2 ELOS  Skilled Therapeutic Interventions/Progress Updates:  Session 1   Pt greeted semi-reclined in bed and agreeable to OT treatment session. Pt reports need to go to the bathroom. Pt needed min A to come to sitting EOB from flat surface. Pt stated she usually sleeps in a recliner bc it is easier for her to get out of than the bed. Pt needed min verbal cues to place neph tube on RW prior to ambulation. Pt ambulated into bathroom w/ RW and supervision. Pt voided bladder with painful spasms. OT refilled ice pack as well. Pt changed brief and pants seated on toilet. Educated on dressing technique using reacher as pt needed assistance to thread R LE into pants.  Pt has experience using reacher and sock-aid for dressing and has both devices at home. Pt then stood at the sink to wash hands with supervision. Pt ambulated 100 ft in hallway w/ RW and close supervision with 1 standing rest break. Pt request to return to bed at end of session and needed min A to bring BLEs into bed. Nursing entered room and pt left semi-reclined with needs met.   Session 2 Pt greeted semi-reclined in bed stating the need to go to the bathroom. Pt completed bed mobility w/ HOB raised and supervision. She ambulated to the bathroom w/ RW and supervision. Pt able to manage clothing and completed toileting tasks with set-up A. Practiced pt emptying nephrostomy bag and educated when not needing to measure output, she could empty bag directly into commode to save herself a step.  Standing endurance at the sink to wash hands and brush hair. Pt  then ambulated to therapy apartment w/ RW supervision. Practiced walk-in shower transfer in simulated home environment w/ supervision. Pt then ambulated to gym in similar fashion. Provided pt with walker bag and completed number scavenger hunt in hallway using RW bag to collect numbers. UB there-ex in standing using 1 lb dowel rod. 3 sets of 10 bicep curls, straight arm raises, chest press. Pt returned to room in wc and then stated she needed to go to the bathroom again. Pt completed bathroom routine in the same fashion as before. Pt returned to bed with min A to advance LEs and left with needs met, bed alarm on, and call bell in reach.   Therapy Documentation Precautions:  Precautions Precautions: Fall Precaution Comments: R neph tube Restrictions Weight Bearing Restrictions: No Pain: Pain Assessment Pain Scale: Faces Pain Score: 10-Worst pain ever Faces Pain Scale: Hurts whole lot Pain Type: Acute pain Pain Location: Groin Pain Descriptors / Indicators: Burning Pain Frequency: Intermittent Pain Intervention(s): Repositioned   Therapy/Group: Individual Therapy  Valma Cava 04/29/2019, 3:55 PM

## 2019-04-29 NOTE — Progress Notes (Addendum)
Patient rested well for approximately 3 hours after midnight, and has now slept the latter 2 hours. Pt reports that she has not slept any. However prn trazodone 50 mg given at hs.  Pt anxious when she is awake, moaning and crying when toileting. Pt up at approximately 3 am and sat on toilet for approximately one hour, states "if I sit here I will pee eventually and I hurt too bad to go back to bed!" Pt noted being upset when staff not immediately available upon her request. Pt noted to call approximately every 15-20 minutes, staff noted to address needs as quickly as possible. Medicated x3 with prn pain meds for pain and burning upon urination. Pt refused zanaflex at hs, reports adverse reaction to another muscle relaxant and is "leery of those medications now". Pt requests to speak to MD in the morning before taking new medication.

## 2019-04-29 NOTE — Progress Notes (Signed)
[facility] PHYSICAL MEDICINE & REHABILITATION PROGRESS NOTE  Subjective/Complaints: Patient seen sitting up in bed this morning.  She states she slept fairly overnight.  She notes mild improvement in bladder pain.  She is looking forward to her discharge date on Saturday.  She did not try the tizanidine.  ROS: + Bladder pain, slightly improved.  Denies CP, shortness of breath, nausea, vomiting, diarrhea.  Objective: Vital Signs: Blood pressure 121/70, pulse 71, temperature 97.7 F (36.5 C), resp. rate 16, height 5\' 6"  (1.676 m), weight 75.1 kg, SpO2 97 %. No results found. Recent Labs    04/27/19 1029  WBC 5.5  HGB 8.9*  HCT 28.2*  PLT 376   Recent Labs    04/27/19 1029  NA 132*  K 4.9  CL 98  CO2 23  GLUCOSE 109*  BUN 48*  CREATININE 3.54*  CALCIUM 9.9    Physical Exam: BP 121/70 (BP Location: Left Arm)   Pulse 71   Temp 97.7 F (36.5 C)   Resp 16   Ht 5\' 6"  (1.676 m)   Wt 75.1 kg   SpO2 97%   BMI 26.72 kg/m  Constitutional: No distress . Vital signs reviewed.  Obese HENT: Normocephalic.  Atraumatic. Eyes: EOMI.  No discharge. Cardiovascular: No JVD. Respiratory: Normal effort. GI: Non-distended. GU: Right nephrostomy tube, draining clear liquid-stable Musc: LE>>UE edema, unchanged Neurological: She is alert and oriented  Motor:  Bilateral lower extremities: Hip flexion, knee extension 3-/5, ankle dorsiflexion 4+/5 (left stronger than right, pain inhibition), unchanged Skin: Skin is warm and dry. No rash noted. She is not diaphoretic. No erythema.  Psychiatric: She has a normal mood and affect. Her behavior is normal.   Assessment/Plan: 1. Functional deficits secondary to debility which require 3+ hours per day of interdisciplinary therapy in a comprehensive inpatient rehab setting.  Physiatrist is providing close team supervision and 24 hour management of active medical problems listed below.  Physiatrist and rehab team continue to assess barriers to  discharge/monitor patient progress toward functional and medical goals  Care Tool:  Bathing    Body parts bathed by patient: Right arm, Left arm, Chest, Abdomen, Front perineal area, Right upper leg, Left upper leg, Buttocks, Face   Body parts bathed by helper: Right lower leg, Left lower leg     Bathing assist Assist Level: Contact Guard/Touching assist     Upper Body Dressing/Undressing Upper body dressing   What is the patient wearing?: Pull over shirt    Upper body assist Assist Level: Supervision/Verbal cueing    Lower Body Dressing/Undressing Lower body dressing      What is the patient wearing?: Underwear/pull up, Pants     Lower body assist Assist for lower body dressing: Minimal Assistance - Patient > 75%     Toileting Toileting    Toileting assist Assist for toileting: Supervision/Verbal cueing     Transfers Chair/bed transfer  Transfers assist     Chair/bed transfer assist level: Supervision/Verbal cueing     Locomotion Ambulation   Ambulation assist      Assist level: Supervision/Verbal cueing Assistive device: Walker-rolling Max distance: 150   Walk 10 feet activity   Assist     Assist level: Supervision/Verbal cueing Assistive device: Walker-rolling   Walk 50 feet activity   Assist Walk 50 feet with 2 turns activity did not occur: Safety/medical concerns  Assist level: Supervision/Verbal cueing Assistive device: Walker-rolling    Walk 150 feet activity   Assist Walk 150 feet activity did not  occur: Safety/medical concerns  Assist level: Supervision/Verbal cueing Assistive device: Walker-rolling    Walk 10 feet on uneven surface  activity   Assist Walk 10 feet on uneven surfaces activity did not occur: Safety/medical concerns         Wheelchair     Assist Will patient use wheelchair at discharge?: No   Wheelchair activity did not occur: N/A         Wheelchair 50 feet with 2 turns  activity    Assist    Wheelchair 50 feet with 2 turns activity did not occur: N/A       Wheelchair 150 feet activity     Assist Wheelchair 150 feet activity did not occur: N/A          Medical Problem List and Plan: 1.  Functional deficits secondary to debility and pain.   Continue CIR 2.  Antithrombotics: -DVT/anticoagulation:  Mechanical: Sequential compression devices, below knee Bilateral lower extremities             -antiplatelet therapy: N/A 3. Multilevel lumbar spondylosis/Pain Management: Oxycodone/tylenol prn  Cont local measures  Lidoderm patch ordered, no benefit, DC'd  Cont Gabapentin 100 TID 4. Mood: LCSW to follow for evaluation and support.              -antipsychotic agents: N/A 5. Neuropsych: This patient is capable of making decisions on her own behalf. 6. Skin/Wound Care: routine pressure relief measures.  7. Fluids/Electrolytes/Nutrition: Strict I/O. 1200 cc FR. Added prostat to help with nutritional status. Continue Ensure bid.   8. Chronic interstitial cystitis/Right hydronephrosis: Continue Enablex with oxycodone prn  Trial tizanidine 2 mg nightly started on 5/27-need to be extremely careful given hypersensitivity to baclofen on previous admission she was able to tolerate tizanidine.  Recommended trial  Discussed with pharmacy for potential alternative to Enablex. 9. CKD: continue NaHCO3.  Hold Lasix at this time.  Monitor weights daily.  Monitor for signs and symptoms of fluid overload.    Creatinine 3.54 on 5/26  Continue to monitor 10. T2DM: Controlled with diet--will monitor BS ac/hs and use SSI prn for elevated BS. Family bringing food from home.  CBG (last 3)  Recent Labs    04/28/19 1726 04/28/19 2137 04/29/19 0655  GLUCAP 84 88 90   Relatively controlled on 5/28 11.  Hypotension: Ordered TEDs. Encourage OOB to help build endurance.             Orthostatics negative on 5/22  Blood pressure labile on 5/28 12. Anemia of chronic  disease: H/o heme positive stools and has declined GI follow up.              Hemoglobin 8.9 on 5/26.  Continue to monitor 13. Thrombocytopenia: Resolved 14. Constipation: Increased Miralax to bid.  Added Colace per patient request.             Improving 15. Neurogenic bladder:   PVRs unremarkable  See #8  UA equivocal, urine culture with multiple species, reordered-multiple species again  Enablex increased on 5/25  See #3, #8 16.  Hyponatremia  Sodium 132 on 5/26  Continue to monitor 17.  Hypoalbuminemia  See #7  LOS: 8 days A FACE TO FACE EVALUATION WAS PERFORMED  Erika Fry [name] 04/29/2019, 9:19 AM

## 2019-04-29 NOTE — Plan of Care (Signed)
  Problem: Consults Goal: RH GENERAL PATIENT EDUCATION Description See Patient Education module for education specifics. Outcome: Progressing Goal: Skin Care Protocol Initiated - if Braden Score 18 or less Description If consults are not indicated, leave blank or document N/A Outcome: Progressing   Problem: RH BOWEL ELIMINATION Goal: RH STG MANAGE BOWEL WITH ASSISTANCE Description STG Manage Bowel with Min.Assistance.  Outcome: Progressing Flowsheets (Taken 04/29/2019 1441) STG: Pt will manage bowels with assistance: 5-Supervision/curing Goal: RH STG MANAGE BOWEL W/MEDICATION W/ASSISTANCE Description STG Manage Bowel with Medication with min assistance    Outcome: Progressing Flowsheets (Taken 04/29/2019 1441) STG: Pt will manage bowels with medication with assistance: 5-Supervision/cueing   Problem: RH BLADDER ELIMINATION Goal: RH STG MANAGE BLADDER WITH ASSISTANCE Description STG Manage Bladder With Mod.Assistance  Outcome: Progressing Flowsheets (Taken 04/29/2019 1441) STG: Pt will manage bladder with assistance: 5-Supervision/cueing Goal: RH STG MANAGE BLADDER WITH MEDICATION WITH ASSISTANCE Description STG Manage Bladder With Medication min. assistance.   Outcome: Progressing Flowsheets (Taken 04/29/2019 1441) STG: Pt will manage bladder with medication with assistance: 4-Minimal assistance Goal: RH STG MANAGE BLADDER WITH EQUIPMENT WITH ASSISTANCE Description STG Manage Bladder With Equipment With min. Assistance  Outcome: Progressing Flowsheets (Taken 04/29/2019 1441) STG: Pt will manage bladder with equipment with assistance: 4-Minimal assistance   Problem: RH SKIN INTEGRITY Goal: RH STG SKIN FREE OF INFECTION/BREAKDOWN Description Skin will remain free of infection/breakdown while in rehab with min assist  Outcome: Progressing   Problem: RH SAFETY Goal: RH STG ADHERE TO SAFETY PRECAUTIONS W/ASSISTANCE/DEVICE Description STG Adhere to Safety Precautions With  Min Assistance/Device.   Outcome: Progressing Flowsheets (Taken 04/29/2019 1441) STG:Pt will adhere to safety precautions with assistance/device: 5-Supervision/cueing   Problem: RH PAIN MANAGEMENT Goal: RH STG PAIN MANAGED AT OR BELOW PT'S PAIN GOAL Description Less than 3  Outcome: Progressing   Problem: RH KNOWLEDGE DEFICIT GENERAL Goal: RH STG INCREASE KNOWLEDGE OF SELF CARE AFTER HOSPITALIZATION Description Pt. Will be able to verbalize both pharmacological and non pharmacological methods to assist with pain control   Outcome: Progressing

## 2019-04-29 NOTE — Progress Notes (Signed)
Physical Therapy Discharge Summary  Patient Details  Name: Erika Fry MRN: [id number] Date of Birth: Apr 17, 1946  Today's Date: 04/30/2019 PT Individual Time: 1000-1035 PT Individual Time Calculation (min): 35 min   Patient in supine and agreeable to therapy, pain as detailed below. Bed mobility and ambulated to/from bathroom at mod/i level w/ RW. Ambulated to/from therapy gym, mod/i, and performed stair negotiation as detailed below. Additionally practiced car transfer w/ supervision. Patient yelling out in pain after stairs and car transfer, states pain is 10/10 and it was not relieved w/ sitting or standing rest breaks. Ongoing education of energy conservation strategies, pain management techniques including ice, and importance of having someone with her for entering/exiting her house and when in community at this time. Pt verbalized understanding and in agreement w/ all education. Returned to room, toilet transfer again mod/i and ended session in supine, all needs met. Missed 25 min of skilled PT 2/2 pain.   Patient has met 8 of 8 long term goals due to improved activity tolerance, improved balance, increased muscular endurance, ability to compensate for deficits and functional use of  right upper extremity, right lower extremity, left upper extremity and left lower extremity.  Patient to discharge at an ambulatory level Supervision for community gait, mod/i for household/controlled gait, and mod/i for transfers using RW.  Patient's care partner is independent to provide the necessary physical assistance at discharge. Pt's children live near by and pt's brother lives a few blocks away, all family members planning to arrange for almost 24/7 supervision. Pt able to direct her care appropriately.   Reasons goals not met: n/a  Recommendation:  Patient will benefit from ongoing skilled PT services in home health setting to continue to advance safe functional mobility, address ongoing impairments  in functional strength, functional balance, endurance, and LE ROM, and minimize fall risk.   Equipment: No equipment provided, pt already w/ equipment she needs after previous CIR admission   Reasons for discharge: treatment goals met and discharge from hospital  Patient/family agrees with progress made and goals achieved: Yes  PT Discharge Precautions/Restrictions Precautions Precautions: Fall Precaution Comments: R neph tube Restrictions Weight Bearing Restrictions: No Pain Pain Assessment Pain Scale: 0-10 Pain Score: 10-Worst pain ever Faces Pain Scale: Hurts a little bit Pain Type: Acute pain Pain Location: Bladder Pain Descriptors / Indicators: Aching;Discomfort Patients Stated Pain Goal: 5 Pain Intervention(s): Repositioned;Cold applied Vision/Perception  Perception Perception: Within Functional Limits Praxis Praxis: Intact  Cognition Overall Cognitive Status: Within Functional Limits for tasks assessed Arousal/Alertness: Awake/alert Orientation Level: Oriented X4 Memory: Appears intact Awareness: Appears intact Problem Solving: Appears intact Safety/Judgment: Appears intact Sensation Sensation Light Touch: Impaired Detail Peripheral sensation comments: Impaired distally>proximally  Light Touch Impaired Details: Impaired LUE;Impaired RUE;Impaired LLE;Impaired RLE Additional Comments: 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 Coordination Gross Motor Movements are Fluid and Coordinated: Yes Fine Motor Movements are Fluid and Coordinated: Yes Motor  Motor Motor: Within Functional Limits Motor - Discharge Observations: generalized weakness, neuropathy in B hands/feet  Mobility Bed Mobility Bed Mobility: Rolling Right;Rolling Left;Sit to Supine;Supine to Sit Rolling Right: Independent Rolling Left: Independent Supine to Sit: Independent Sit to Supine: Independent Transfers Transfers: Sit to Stand;Stand to [employer] Transfers Sit to Stand: Independent with  assistive device Stand to Sit: Independent with assistive device Stand Pivot Transfers: Independent with assistive device Transfer (Assistive device): Rolling walker Locomotion  Gait Ambulation: Yes Gait Assistance: Independent with assistive device  Gait Distance (Feet): 150 Feet Assistive device: Rolling walker Gait Gait:  Yes Gait Pattern: Impaired Gait Pattern: Trunk flexed;Wide base of support;Poor foot clearance - left;Poor foot clearance - right;Shuffle;Decreased hip/knee flexion - left;Decreased hip/knee flexion - right;Decreased dorsiflexion - right;Decreased dorsiflexion - left Stairs / Additional Locomotion Stairs: Yes Stairs Assistance: Supervision/Verbal cueing Stair Management Technique: Two rails Number of Stairs: 12 Height of Stairs: 3 Wheelchair Mobility Wheelchair Mobility: No  Trunk/Postural Assessment  Cervical Assessment Cervical Assessment: Within Functional Limits Thoracic Assessment Thoracic Assessment: Exceptions to WFL(kyphotic) Lumbar Assessment Lumbar Assessment: Exceptions to WFL(posterior pelvic tilt) Postural Control Postural Control: Within Functional Limits  Balance Balance Balance Assessed: Yes Static Sitting Balance Static Sitting - Balance Support: No upper extremity supported;Feet supported Static Sitting - Level of Assistance: 7: Independent Dynamic Sitting Balance Dynamic Sitting - Balance Support: No upper extremity supported;Feet supported Dynamic Sitting - Level of Assistance: 7: Independent Static Standing Balance Static Standing - Balance Support: No upper extremity supported;During functional activity Static Standing - Level of Assistance: 5: Stand by assistance Dynamic Standing Balance Dynamic Standing - Balance Support: No upper extremity supported;During functional activity Dynamic Standing - Level of Assistance: 5: Stand by assistance Extremity Assessment  RLE Assessment RLE Assessment: Exceptions to [facility] Passive Range of  Motion (PROM) Comments: WFL General Strength Comments: Globally 3+ to 45 LLE Assessment LLE Assessment: Exceptions to [facility] Passive Range of Motion (PROM) Comments: [facility] General Strength Comments: Globally 3+ to 4/5    Nekia Maxham K Raiana Pharris 04/29/2019, 7:18 PM

## 2019-04-29 NOTE — Progress Notes (Signed)
Physical Therapy Session Note  Patient Details  Name: Erika Fry MRN: [id number] Date of Birth: October 27, 1946  Today's Date: 04/29/2019 PT Individual Time: 1313-1402 PT Individual Time Calculation (min): 49 min   Short Term Goals: Week 1:  PT Short Term Goal 1 (Week 1): Pt will ambulate 79' w/ supervision using LRAD PT Short Term Goal 2 (Week 1): Pt will negotiate 2 steps w/ R rail and min assist PT Short Term Goal 3 (Week 1): Pt will perform bed mobility w/ min assist PT Short Term Goal 4 (Week 1): Pt will perform stand pivot transfer w/ supervision  Skilled Therapeutic Interventions/Progress Updates:    Patient received sitting in bed finishing lunch. Patient reporting pain as listed below and able to maintain conversation, however at times moans out in pain. Patient performing functional mobility at supervision level. Some verbal cueing needed for safety and sequencing with device to promote efficiency and overall independence for carryover in the home environment. Standing ther-ex and balance activities this session with good tolerance: heel raises, mini squats, hip abduction, high knee marches x 10 reps. While standing on AirEx patient with noted forward weight shift requiring manual facilitation for posterior weight shift and balance as well as heavy reliance on UE support at RW. Discussion with patient regarding pain management at home with education on correct usage of ice/hot packs to prevent skin breakdown with good verbal understanding. Patient ambulating back to room with RW (~150 feet) at supervision level with patient return to supine and all needs met.   Therapy Documentation Precautions:  Precautions Precautions: Fall Precaution Comments: R neph tube Restrictions Weight Bearing Restrictions: No Vital Signs: Therapy Vitals Temp: (!) 97.5 F (36.4 C) Temp Source: Oral Pulse Rate: 65 Resp: 16 BP: (!) 109/59 Patient Position (if appropriate): Lying Oxygen  Therapy SpO2: 100 % O2 Device: Room Air Pain: Pain Assessment Pain Scale: Faces Pain Score: 10-Worst pain ever Faces Pain Scale: Hurts whole lot Pain Type: Acute pain Pain Location: Groin Pain Descriptors / Indicators: Burning Pain Frequency: Intermittent Pain Intervention(s): Medication (See eMAR);Repositioned   Therapy/Group: Individual Therapy   Lanney Gins, PT, DPT Supplemental Physical Therapist 04/29/19 3:45 PM Pager: [phone] Office: [phone]

## 2019-04-30 ENCOUNTER — Inpatient Hospital Stay (HOSPITAL_COMMUNITY): Payer: Medicare Other | Admitting: Physical Therapy

## 2019-04-30 ENCOUNTER — Inpatient Hospital Stay (HOSPITAL_COMMUNITY): Payer: Medicare Other

## 2019-04-30 ENCOUNTER — Inpatient Hospital Stay (HOSPITAL_COMMUNITY): Payer: Medicare Other | Admitting: Occupational Therapy

## 2019-04-30 LAB — GLUCOSE, CAPILLARY
Glucose-Capillary: 82 mg/dL (ref 70–99)
Glucose-Capillary: 89 mg/dL (ref 70–99)
Glucose-Capillary: 89 mg/dL (ref 70–99)
Glucose-Capillary: 92 mg/dL (ref 70–99)

## 2019-04-30 MED ORDER — FLAVOXATE HCL 100 MG PO TABS: 100.0000 mg | ORAL_TABLET | Freq: Three times a day (TID) | ORAL | 0 refills | Status: DC | PRN

## 2019-04-30 MED ORDER — LEVOTHYROXINE SODIUM 75 MCG PO TABS: 75.0000 ug | ORAL_TABLET | Freq: Every day | ORAL | 1 refills | Status: AC

## 2019-04-30 MED ORDER — POLYETHYLENE GLYCOL 3350 17 G PO PACK: 17.0000 g | PACK | Freq: Two times a day (BID) | ORAL | 0 refills | Status: DC

## 2019-04-30 MED ORDER — FERROUS SULFATE 325 (65 FE) MG PO TABS: 325.0000 mg | ORAL_TABLET | Freq: Two times a day (BID) | ORAL | 0 refills | Status: DC

## 2019-04-30 MED ORDER — OXYCODONE HCL 5 MG PO TABS: 5.0000 mg | ORAL_TABLET | Freq: Four times a day (QID) | ORAL | 0 refills | Status: DC | PRN

## 2019-04-30 MED ORDER — ACETAMINOPHEN 325 MG PO TABS: 325.0000 mg | ORAL_TABLET | ORAL | Status: DC | PRN

## 2019-04-30 MED ORDER — DOCUSATE SODIUM 100 MG PO CAPS: 100.0000 mg | ORAL_CAPSULE | Freq: Two times a day (BID) | ORAL | 0 refills | Status: DC

## 2019-04-30 MED ORDER — DARIFENACIN HYDROBROMIDE ER 15 MG PO TB24: 15.0000 mg | ORAL_TABLET | Freq: Every day | ORAL | 1 refills | Status: DC

## 2019-04-30 MED ORDER — MUSCLE RUB 10-15 % EX CREA: 1.0000 "application " | TOPICAL_CREAM | CUTANEOUS | 0 refills | Status: DC | PRN

## 2019-04-30 MED ORDER — GABAPENTIN 100 MG PO CAPS: 100.0000 mg | ORAL_CAPSULE | Freq: Three times a day (TID) | ORAL | 1 refills | Status: DC

## 2019-04-30 NOTE — Plan of Care (Signed)
  Problem: Consults Goal: RH GENERAL PATIENT EDUCATION Description See Patient Education module for education specifics. Outcome: Progressing Goal: Skin Care Protocol Initiated - if Braden Score 18 or less Description If consults are not indicated, leave blank or document N/A Outcome: Progressing   Problem: RH BOWEL ELIMINATION Goal: RH STG MANAGE BOWEL WITH ASSISTANCE Description STG Manage Bowel with Min.Assistance.  Outcome: Progressing Flowsheets (Taken 04/30/2019 1238) STG: Pt will manage bowels with assistance: 6-Modified independent Goal: RH STG MANAGE BOWEL W/MEDICATION W/ASSISTANCE Description STG Manage Bowel with Medication with min assistance    Outcome: Progressing Flowsheets (Taken 04/30/2019 1238) STG: Pt will manage bowels with medication with assistance: 6-Modified independent   Problem: RH BLADDER ELIMINATION Goal: RH STG MANAGE BLADDER WITH ASSISTANCE Description STG Manage Bladder With Mod.Assistance  Outcome: Progressing Flowsheets (Taken 04/30/2019 1238) STG: Pt will manage bladder with assistance: 6-Modified independent Goal: RH STG MANAGE BLADDER WITH MEDICATION WITH ASSISTANCE Description STG Manage Bladder With Medication min. assistance.   Outcome: Progressing Flowsheets (Taken 04/30/2019 1238) STG: Pt will manage bladder with medication with assistance: 6-Modified independent Goal: RH STG MANAGE BLADDER WITH EQUIPMENT WITH ASSISTANCE Description STG Manage Bladder With Equipment With min. Assistance  Outcome: Progressing Flowsheets (Taken 04/30/2019 1238) STG: Pt will manage bladder with equipment with assistance: 6-Modified independent   Problem: RH SKIN INTEGRITY Goal: RH STG SKIN FREE OF INFECTION/BREAKDOWN Description Skin will remain free of infection/breakdown while in rehab with min assist  Outcome: Progressing   Problem: RH SAFETY Goal: RH STG ADHERE TO SAFETY PRECAUTIONS W/ASSISTANCE/DEVICE Description STG Adhere to Safety  Precautions With Min Assistance/Device.   Outcome: Progressing Flowsheets (Taken 04/30/2019 1238) STG:Pt will adhere to safety precautions with assistance/device: 6-Modified independent   Problem: RH PAIN MANAGEMENT Goal: RH STG PAIN MANAGED AT OR BELOW PT'S PAIN GOAL Description Less than 3  Outcome: Progressing   Problem: RH KNOWLEDGE DEFICIT GENERAL Goal: RH STG INCREASE KNOWLEDGE OF SELF CARE AFTER HOSPITALIZATION Description Pt. Will be able to verbalize both pharmacological and non pharmacological methods to assist with pain control   Outcome: Progressing

## 2019-04-30 NOTE — Progress Notes (Signed)
Occupational Therapy Session Note  Patient Details  Name: Erika Fry MRN: [id number] Date of Birth: 25-Aug-1946  Today's Date: 04/30/2019 OT Individual Time: 0830-0930 OT Individual Time Calculation (min): 60 min    Short Term Goals: Week 1:  OT Short Term Goal 1 (Week 1): STG=LTG 2/2 ELOS  Skilled Therapeutic Interventions/Progress Updates:    Pt received bed level with c/o pain as described below- ice pack refilled for pt. Pt completed functional mobility into bathroom and transferred to toilet with mod I. Discussed making pt mod I in room with PT and primary OT- both in agreeance, and relayed convo to pt. Mod I with RW sign posted outside of room. Pt able to complete peri hygiene after toileting with mod I. Discussed fall prevention and bed mobility with pt when in room alone. Pt demonstrated being able to lift both legs into bed with use of bed rail, mod I overall. Pt perseverating on wait time for nursing staff to toilet pt at night- redirection provided multiple times. Pt changed sweater with mod I- managing drain without cueing. Recommended visual aids for remainders at home to manage drain. Pt required assistance to don socks EOB, reporting she will use a sock aid at home/get family assistance. Pt completed toileting again at end of session with mod I. Pt left supine with all needs met.   Therapy Documentation Precautions:  Precautions Precautions: Fall Precaution Comments: R neph tube Restrictions Weight Bearing Restrictions: No Pain: Pain Assessment Pain Scale: 0-10 Pain Score: 10-Worst pain ever Faces Pain Scale: Hurts a little bit Pain Type: Acute pain Pain Location: Bladder Pain Descriptors / Indicators: Aching;Discomfort Patients Stated Pain Goal: 5 Pain Intervention(s): Medication (See eMAR);Cold applied   Therapy/Group: Individual Therapy  Curtis Sites 04/30/2019, 8:50 AM

## 2019-04-30 NOTE — Patient Care Conference (Signed)
Inpatient RehabilitationTeam Conference and Plan of Care Update Date: 04/28/2019   Time: 2: 13 PM    Patient Name: Erika Fry      Medical Record Number: [id number]  Date of Birth: 1946/06/08 Sex: Female         Room/Bed: 4M11C/4M11C-01 Payor Info: Payor: [employer] MEDICARE / Plan: [facility] MEDICARE / Product Type: *No Product type* /    Admitting Diagnosis: ABI Team  Debility, malaise; 11-13days  Admit Date/Time:  04/21/2019  4:34 PM Admission Comments: No comment available   Primary Diagnosis:  <principal problem not specified> Principal Problem: <principal problem not specified>  Patient Active Problem List   Diagnosis Date Noted  . Labile blood pressure   . Bladder pain   . Hyponatremia   . Acute on chronic anemia   . Diabetes mellitus type 2 in obese ([place])   . Chronic kidney disease   . Hypotension   . Drug induced constipation   . Neurogenic bladder   . Debility 04/21/2019  . Acquired hydronephrosis with ureteropelvic junction (UPJ) obstruction   . Pain in joint involving pelvic region and thigh   . Dysuria   . Palliative care by specialist   . AKI (acute kidney injury) ([place]) 04/10/2019  . Hydronephrosis 04/09/2019  . Interstitial cystitis 04/09/2019  . Bilateral leg edema 02/25/2019  . Chronic heart failure with preserved ejection fraction ([place]) 02/24/2019  . Cardiac pacemaker in situ 02/24/2019  . ARF (acute renal failure) ([place]) 02/10/2019  . Peripheral edema   . Fever 01/17/2019  . Acute lower UTI 01/17/2019  . Pressure injury of skin 01/17/2019  . Gross hematuria 01/14/2019  . Fluid retention   . Generalized edema   . Hypokalemia   . Acute right-sided low back pain with right-sided sciatica   . Acute blood loss anemia   . Anemia of chronic disease   . Thrombocytopenia ([place])   . Diabetes mellitus ([place])   . Morbid obesity ([place])   . Benign essential HTN   . Hypoalbuminemia due to protein-calorie malnutrition ([place])   . Occult blood in stools   .  Gastric polyp   . Lumbar discitis 05/17/2018  . Blood loss anemia 05/12/2018  . Hyperlipidemia associated with type 2 diabetes mellitus ([place]) 05/10/2018  . Essential hypertension 04/29/2018  . CKD (chronic kidney disease) stage 4, GFR 15-29 ml/min ([facility])   . Diet-controlled diabetes mellitus ([place])   . GERD (gastroesophageal reflux disease)   . Gout   . High cholesterol   . Hypothyroidism   . Iron deficiency anemia   . AV block, Mobitz 2 03/25/2018  . Mobitz type 2 second degree AV block 03/20/2018  . Aortic stenosis 03/20/2018  . Exertional dyspnea 03/19/2018    Expected Discharge Date: Expected Discharge Date: 05/01/19  Team Members Present: Physician leading conference: Dr. Delice Lesch Social Worker Present: Alfonse Alpers, LCSW Nurse Present: Rayetta Pigg, RN PT Present: Burnard Bunting, PT OT Present: Willeen Cass, OT SLP Present: Weston Anna, SLP PPS Coordinator present : Gunnar Fusi     Current Status/Progress Goal Weekly Team Focus  Medical   Functional deficits secondary to debility and pain.   Improve mobility, bladder pain, BP, bladder pain, CKD  See above   Bowel/Bladder   Continent of bowel and bladder, urinary urgency and frequency, pain and burning with urination-urine cx pending,  nephrostomy tube LBM-04/27/19  improvement of urinary symptoms, regain regular urinary pattern  Assist with toileting needs, continue to monitor and address symptoms as ordered  Swallow/Nutrition/ Hydration             ADL's   Supervision/min A overall  Mod I/supervision  pain management, activity tolerance, general strengthening, self-care retraining, dc planning, pt/family education   Mobility   S-CGA overall w/ RW, >150' gait  Mod I/supervision  pain management, activity tolerance, global strengthening and endurance, d/c planning    Communication             Safety/Cognition/ Behavioral Observations            Pain   Constant complaints of abdominal, perineal, bladder  pain-managed by oxycodone, tylenol, urispas, lidocaine patch  Pain level <4/10  Assess pain every shift and as needed   Skin   extensive area on her buttocks that extends to both inner thighs area, front and back, area is red with some excoriated open spots.  no new skin issues  Assess skin every shift and as needed, treat as ordered    Rehab Goals Patient on target to meet rehab goals: Yes Rehab Goals Revised: none *See Care Plan and progress notes for long and short-term goals.     Barriers to Discharge  Current Status/Progress Possible Resolutions Date Resolved   Physician    Medical stability;Neurogenic Bowel & Bladder     See above  Therapies, optimize pain meds, follow labs      Nursing                  PT                    OT                  SLP                SW                Discharge Planning/Teaching Needs:  Pt plans to return home with family to provide supervision for her.  Pt is independent to direct her care.   Team Discussion:  Pt is medically stable besides c/o bladder pain from spasms.  Gabapentin seems to be helping and Dr. Posey Pronto may need to increase it.  Pt's urine culture is pending.  Pt is supervision overall with RW for over 150'.  PT recommending supervision at home 24/7.  Pt is supervision to min A with mod I/ supervision PT goals.    Revisions to Treatment Plan:  none    Continued Need for Acute Rehabilitation Level of Care: The patient requires daily medical management by a physician with specialized training in physical medicine and rehabilitation for the following conditions: Daily direction of a multidisciplinary physical rehabilitation program to ensure safe treatment while eliciting the highest outcome that is of practical value to the patient.: Yes Daily medical management of patient stability for increased activity during participation in an intensive rehabilitation regime.: Yes Daily analysis of laboratory values and/or radiology reports  with any subsequent need for medication adjustment of medical intervention for : Post surgical problems;Urological problems   I attest that I was present, lead the team conference, and concur with the assessment and plan of the team.Team conference was held via web/ teleconference due to [place] - 19.   Arliene Rosenow, Silvestre Mesi 04/30/2019, 5:04 PM

## 2019-04-30 NOTE — Progress Notes (Signed)
Social Work Patient ID: Erika Fry, female   DOB: [birth year], 73 y.o.   MRN: [id number]   CSW met with pt to update her on team conference discussion and left dtr-in-law, Anderson Malta, a message re: pt's d/c and anticipated need for 24/7 supervision.  Pt stated she's already talked with family about this and Anderson Malta did not return call.  CSW arrange [facility] to resume services and pt has all recommended DME at home already.  CSW will continue to follow and assist as needed.

## 2019-04-30 NOTE — Progress Notes (Signed)
[facility] PHYSICAL MEDICINE & REHABILITATION PROGRESS NOTE  Subjective/Complaints: Patient seen ambulating with rolling walker with nursing this morning.  She states she slept well overnight.  She slept well after receiving medications per nursing.  Patient refusing labs as well as trial medications, discussed with patient.  Discussed with nursing as well.  ROS: + Bladder pain.  Denies CP, shortness of breath, nausea, vomiting, diarrhea.  Objective: Vital Signs: Blood pressure 134/67, pulse 72, temperature 97.8 F (36.6 C), temperature source Oral, resp. rate 18, height 5\' 6"  (1.676 m), weight 75.1 kg, SpO2 100 %. No results found. Recent Labs    04/27/19 1029  WBC 5.5  HGB 8.9*  HCT 28.2*  PLT 376   Recent Labs    04/27/19 1029  NA 132*  K 4.9  CL 98  CO2 23  GLUCOSE 109*  BUN 48*  CREATININE 3.54*  CALCIUM 9.9    Physical Exam: BP 134/67 (BP Location: Left Arm)   Pulse 72   Temp 97.8 F (36.6 C) (Oral)   Resp 18   Ht 5\' 6"  (1.676 m)   Wt 75.1 kg   SpO2 100%   BMI 26.72 kg/m  Constitutional: No distress . Vital signs reviewed.  Obese HENT: Normocephalic.  Atraumatic. Eyes: EOMI.  No discharge. Cardiovascular: No JVD. Respiratory: Normal effort. GI: Non-distended. GU: Right nephrostomy tube, draining clear liquid- unchanged Musc: Lower extremity edema, unchanged Neurological: She is alert and oriented  Motor:  Bilateral lower extremities: Hip flexion, knee extension 3-/5, ankle dorsiflexion 4+/5 (left stronger than right, pain inhibition), stable Skin: Skin is warm and dry. No rash noted. She is not diaphoretic. No erythema.  Psychiatric: She has a normal mood and affect. Her behavior is normal.   Assessment/Plan: 1. Functional deficits secondary to debility which require 3+ hours per day of interdisciplinary therapy in a comprehensive inpatient rehab setting.  Physiatrist is providing close team supervision and 24 hour management of active medical  problems listed below.  Physiatrist and rehab team continue to assess barriers to discharge/monitor patient progress toward functional and medical goals  Care Tool:  Bathing    Body parts bathed by patient: Right arm, Left arm, Chest, Abdomen, Front perineal area, Right upper leg, Left upper leg, Buttocks, Face   Body parts bathed by helper: Right lower leg, Left lower leg     Bathing assist Assist Level: Contact Guard/Touching assist     Upper Body Dressing/Undressing Upper body dressing   What is the patient wearing?: Pull over shirt    Upper body assist Assist Level: Supervision/Verbal cueing    Lower Body Dressing/Undressing Lower body dressing      What is the patient wearing?: Underwear/pull up, Pants     Lower body assist Assist for lower body dressing: Minimal Assistance - Patient > 75%     Toileting Toileting    Toileting assist Assist for toileting: Supervision/Verbal cueing     Transfers Chair/bed transfer  Transfers assist     Chair/bed transfer assist level: Supervision/Verbal cueing     Locomotion Ambulation   Ambulation assist      Assist level: Supervision/Verbal cueing Assistive device: Walker-rolling Max distance: 150   Walk 10 feet activity   Assist     Assist level: Supervision/Verbal cueing Assistive device: Walker-rolling   Walk 50 feet activity   Assist Walk 50 feet with 2 turns activity did not occur: Safety/medical concerns  Assist level: Supervision/Verbal cueing Assistive device: Walker-rolling    Walk 150 feet activity  Assist Walk 150 feet activity did not occur: Safety/medical concerns  Assist level: Supervision/Verbal cueing Assistive device: Walker-rolling    Walk 10 feet on uneven surface  activity   Assist Walk 10 feet on uneven surfaces activity did not occur: Safety/medical concerns         Wheelchair     Assist Will patient use wheelchair at discharge?: No   Wheelchair activity  did not occur: N/A         Wheelchair 50 feet with 2 turns activity    Assist    Wheelchair 50 feet with 2 turns activity did not occur: N/A       Wheelchair 150 feet activity     Assist Wheelchair 150 feet activity did not occur: N/A          Medical Problem List and Plan: 1.  Functional deficits secondary to debility and pain.   Continue CIR  Plan for d/c tomorrow  Will see patient for transitional care management in 1-2 weeks post-discharge 2.  Antithrombotics: -DVT/anticoagulation:  Mechanical: Sequential compression devices, below knee Bilateral lower extremities             -antiplatelet therapy: N/A 3. Multilevel lumbar spondylosis/Pain Management: Oxycodone/tylenol prn  Cont local measures  Lidoderm patch ordered, no benefit, DC'd  Cont Gabapentin 100 TID 4. Mood: LCSW to follow for evaluation and support.              -antipsychotic agents: N/A 5. Neuropsych: This patient is capable of making decisions on her own behalf. 6. Skin/Wound Care: routine pressure relief measures.  7. Fluids/Electrolytes/Nutrition: Strict I/O. 1200 cc FR. Added prostat to help with nutritional status. Continue Ensure bid.   8. Chronic interstitial cystitis/Right hydronephrosis: Continue Enablex with oxycodone prn  Trial tizanidine 2 mg nightly started on 5/27-need to be extremely careful given hypersensitivity to baclofen on previous admission she was able to tolerate tizanidine.  Recommended trial, patient refusing.  Discussed with pharmacy, added as needed oxybutynin, however patient unwilling to try. 9. CKD: continue NaHCO3.  Hold Lasix at this time.  Monitor weights daily.  Monitor for signs and symptoms of fluid overload.    Creatinine 3.54 on 5/26, patient refusing repeat labs  Continue to monitor 10. T2DM: Controlled with diet--will monitor BS ac/hs and use SSI prn for elevated BS. Family bringing food from home.  CBG (last 3)  Recent Labs    04/29/19 1654  04/29/19 2135 04/30/19 0717  GLUCAP 97 98 82   Relatively controlled on 5/29 11.  Hypotension: Ordered TEDs. Encourage OOB to help build endurance.             Orthostatics negative on 5/22  Blood pressure labile on 5/29 12. Anemia of chronic disease: H/o heme positive stools and has declined GI follow up.              Hemoglobin 8.9 on 5/26.  Continue to monitor 13. Thrombocytopenia: Resolved 14. Constipation: Increased Miralax to bid.  Added Colace per patient request.             Improving 15. Neurogenic bladder:   PVRs unremarkable  See #8  UA equivocal, urine culture with multiple species, reordered-multiple species again  Enablex increased on 5/25  See #3, #8 16.  Hyponatremia  Sodium 132 on 5/26  Continue to monitor 17.  Hypoalbuminemia  See #7  LOS: 9 days A FACE TO FACE EVALUATION WAS PERFORMED  Ankit [name] 04/30/2019, 9:27 AM

## 2019-04-30 NOTE — Discharge Summary (Addendum)
Physician Discharge Summary  Patient ID: Erika Fry MRN: [id number] DOB/AGE: Apr 30, 1946 73 y.o.  Admit date: 04/21/2019 Discharge date: 05/01/2019  Discharge Diagnoses:  Principal Problem:   Debility Active Problems:   Chronic kidney disease   Drug induced constipation   Neurogenic bladder   Hyponatremia   Acute on chronic anemia   Diabetes mellitus type 2 in obese [facility])   Bladder pain   Discharged Condition: stable   Significant Diagnostic Studies: N/A   Labs:  Basic Metabolic Panel: BMP Latest Ref Rng & Units 04/27/2019 04/22/2019 04/21/2019  Glucose 70 - 99 mg/dL 109(H) 118(H) 98  BUN 8 - 23 mg/dL 48(H) 69(H) 70(H)  Creatinine 0.44 - 1.00 mg/dL 3.54(H) 4.90(H) 4.63(H)  BUN/Creat Ratio 6 - 22 (calc) - - -  Sodium 135 - 145 mmol/L 132(L) 132(L) 132(L)  Potassium 3.5 - 5.1 mmol/L 4.9 4.4 5.0  Chloride 98 - 111 mmol/L 98 99 97(L)  CO2 22 - 32 mmol/L 23 23 24   Calcium 8.9 - 10.3 mg/dL 9.9 8.8(L) 8.9    CBC: CBC Latest Ref Rng & Units 04/27/2019 04/22/2019 04/15/2019  WBC 4.0 - 10.5 K/uL 5.5 7.6 6.8  Hemoglobin 12.0 - 15.0 g/dL 8.9(L) 7.0(L) 8.2(L)  Hematocrit 36.0 - 46.0 % 28.2(L) 21.9(L) 25.0(L)  Platelets 150 - 400 K/uL 376 245 81(L)    CBG: Recent Labs  Lab 04/30/19 0717 04/30/19 1146 04/30/19 1646 04/30/19 2127 05/01/19 0642  GLUCAP 82 89 89 92 92    Brief HPI:   Erika Fry is a 73 year old female with history of CKD-IV, HTN, T2DM, CHB s/p PPM, lumbar diskitis, chronic intersitial cystitis with chronic pain, right obstructive hydronephrosis who underwent percutaneous nephrostomy by interventional radiology on 04/03/2019 but developed severe pain requiring admission for pain management.  She continued to have issues with pain management, fevers, acute on chronic renal failure with metabolic acidosis treated with bicarb and IV fluids.  MRI lumbar spine done due to ongoing pain and showed chronic L4/5 discitis and no evidence of persistent or new  infection as well as improvement in hydronephrosis..  Palliative care consulted for goals of care and pain management.  Patient elected on full scope of practice.  Therapy initiated and patient has had significant decline in the past few months with multiple admissions.  CIR was recommended due to debility.   Hospital Course: Erika Fry was admitted to rehab 04/21/2019 for inpatient therapies to consist of PT and OT at least three hours five days a week. Past admission physiatrist, therapy team and rehab RN have worked together to provide customized collaborative inpatient rehab.  Patient has been afebrile during the stay.  She continues to have issues with pain and Urispas was added to be used on PRN basis.  She continues to report dysuria with vaginal pain and has required oxycodone PRN for pain management.  She was treated with Diflucan x1 and gabapentin was resumed at 100 mg tid per patient request.  Urine cultures were checked x2 and both have been negative for infection.  Serial check of lites shows steady improvement in renal status with BUN down to 48/sCR- 3.54.    Blood pressures have improved and hypotension has resolved.  Bowel program was adjusted to manage constipation.  She continues to have issues with urge incontinence due to neurogenic bladder as well as pain.  Acute blood loss anemia is improving.  She continues to have 300-400 cc clear urine draining from her nephrostomy.  She has had improvement  in activity tolerance, mobility and overall mobility.  She has made good gains during her rehab stay and is modified independent at discharge.  She will continue to receive follow up [facility], [facility] and [facility] by [facility] after discharge.    Rehab course: During patient's stay in rehab weekly team conference was to monitor patient's progress, set goals and discuss barriers to discharge. At admission, patient required min to mod assist with basic self-care tasks and min assist with mobility.   She  has had improvement in activity tolerance, balance, postural control as well as ability to compensate for deficits.  She is able to complete ADL tasks at modified independent level. She is modified independent for transfers and to ambulate 150' with RW. She requires supervision to climb stairs.     Disposition: Home   Diet: Heart Healthy/1200 cc fluid per day.   Special Instructions: 1. Need to have supervision out of home environment--yard, street, shopping.  2. Empty nephrostomy at 1-2 times a day and keep record of drainage.    Discharge Instructions    Ambulatory referral to Physical Medicine Rehab   Complete by:  As directed    1--2 weeks transitional care     Allergies as of 05/01/2019      Reactions   Baclofen Other (See Comments)   somnolence   Codeine Other (See Comments)   Increases Pain and couldn't sleep      Medication List    STOP taking these medications   Myrbetriq 50 MG Tb24 tablet Generic drug:  mirabegron ER   solifenacin 5 MG tablet Commonly known as:  VESICARE Replaced by:  darifenacin 15 MG 24 hr tablet     TAKE these medications   acetaminophen 325 MG tablet Commonly known as:  TYLENOL Take 1-2 tablets (325-650 mg total) by mouth every 4 (four) hours as needed for mild pain. What changed:    how much to take  when to take this  reasons to take this   ALOE VERA PO Take 3 capsules by mouth 2 (two) times a day.   Biotin 5 MG Caps Take 1 capsule (5 mg total) by mouth daily.   cetirizine 10 MG tablet Commonly known as:  ZYRTEC Take 10 mg by mouth at bedtime.   darifenacin 15 MG 24 hr tablet Commonly known as:  ENABLEX Take 1 tablet (15 mg total) by mouth daily. Replaces:  solifenacin 5 MG tablet   docusate sodium 100 MG capsule Commonly known as:  COLACE Take 1 capsule (100 mg total) by mouth 2 (two) times daily.   ferrous sulfate 325 (65 FE) MG tablet Take 1 tablet (325 mg total) by mouth 2 (two) times daily with a meal.    flavoxATE 100 MG tablet Commonly known as:  URISPAS Take 1 tablet (100 mg total) by mouth 3 (three) times daily as needed (bladder spasms, dysuria).   fluticasone 50 MCG/ACT nasal spray Commonly known as:  FLONASE Place 2 sprays into both nostrils daily as needed for allergies or rhinitis.   folic acid 1 MG tablet Commonly known as:  FOLVITE Take 1 tablet (1 mg total) by mouth daily.   gabapentin 100 MG capsule Commonly known as:  NEURONTIN Take 1 capsule (100 mg total) by mouth 3 (three) times daily.   levothyroxine 75 MCG tablet Commonly known as:  SYNTHROID Take 1 tablet (75 mcg total) by mouth daily before breakfast.   MULTI-VITAMIN PO Take 1 tablet by mouth daily.   Muscle Rub  10-15 % Crea Apply 1 application topically every 4 (four) hours as needed for muscle pain.   oxyCODONE 5 MG immediate release tablet--Rx 28 pills.  Commonly known as:  Oxy IR/ROXICODONE Take 1 tablet (5 mg total) by mouth every 6 (six) hours as needed for severe pain. What changed:    when to take this  reasons to take this   pantoprazole 40 MG tablet Commonly known as:  PROTONIX Take 1 tablet (40 mg total) by mouth 2 (two) times daily for 30 days. (take once daily after 1 month)   polyethylene glycol 17 g packet Commonly known as:  MIRALAX / GLYCOLAX Take 17 g by mouth 2 (two) times daily.   simvastatin 40 MG tablet Commonly known as:  ZOCOR Take 1 tablet (40 mg total) by mouth at bedtime.   sodium bicarbonate 650 MG tablet Take 2 tablets (1,300 mg total) by mouth 2 (two) times daily.   vitamin B-12 1000 MCG tablet Commonly known as:  CYANOCOBALAMIN Take 1 tablet (1,000 mcg total) by mouth daily.   VITAMIN C PO Take 1 tablet by mouth 2 (two) times daily.      Follow-up Information    Adrian Prows, MD Follow up on 05/10/2019.   Specialty:  Cardiology Why:  @ 8:30 - Virtual visit - changed from 04-23-19 while you were on Inpatient Rehab Contact information: [place] [address] [phone]        Jamse Arn, MD Follow up.   Specialty:  Physical Medicine and Rehabilitation Why:  office will call with follow up appointment Contact information: [address] [place] [address] [phone]        Ardis Hughs, MD Follow up.   Specialty:  Urology Why:  Office is going to call you with follow up appointment Contact information: [place] [address] [phone]        [name], MD Follow up.   Specialty:  Internal Medicine Why:  Dr. Danna Hefty office will call you to schedule post-hospital f/u visit. Contact information: [address] [place] [address] [phone]           Signed: Bary Leriche 05/03/2019, 11:29 AM Patient seen and examined by me on day of discharge. Delice Lesch, MD, ABPMR

## 2019-04-30 NOTE — Discharge Instructions (Signed)
Inpatient Rehab Discharge Instructions  [facility] Discharge date and time: 05/01/19   Activities/Precautions/ Functional Status: Activity: no lifting, driving, or strenuous exercise till cleared by MD Diet: cardiac diet Wound Care: Keep area around tube clean and dry.   Functional status:  ___ No restrictions     ___ Walk up steps independently ___ 24/7 supervision/assistance   ___ Walk up steps with assistance _X__ Intermittent supervision/assistance  _X__ Bathe/dress independently ___ Walk with walker     ___ Bathe/dress with assistance ___ Walk Independently    ___ Shower independently ___ Walk with assistance    ___ Shower with assistance _X__ No alcohol     ___ Return to work/school ________  COMMUNITY REFERRALS UPON DISCHARGE:   Home Health:   PT     OT     RN  Agency:  [facility]:  [phone] Medical Equipment/Items Ordered: you have all recommended DME at home already.   Special Instructions: 1. Need to have supervision out of home environment--yard, street, shopping.  2. Empty nephrostomy at 1-2 times a day and keep record of drainage. 3. Home health PT, OT and RN afte   My questions have been answered and I understand these instructions. I will adhere to these goals and the provided educational materials after my discharge from the hospital.  Patient/Caregiver Signature _______________________________ Date __________  Clinician Signature _______________________________________ Date __________  Please bring this form and your medication list with you to all your follow-up doctor's appointments.

## 2019-04-30 NOTE — Progress Notes (Signed)
Occupational Therapy Discharge Summary  Patient Details  Name: Erika Fry MRN: [id number] Date of Birth: [birth year]  Today's Date: 04/30/2019 OT Individual Time: 1302-1402 OT Individual Time Calculation (min): 60 min    Pt greeted semi-reclined in bed and agreeable to OT treatment session. Pt ambulates throughout session mod I w/ RW. Pt completes toilet transfers and emptys nephrostomy bag mod I. Pt ambulated to therapy gym and worked on standing balance/endurance with standing peg board puzzle activity. Pt took seated rest break with reports of increased pain in bladder. Pt requests to return to room. Pt needed increased time time to ambulate back to room 2/2 pain with multiple rest breaks in standing. Pt returned to bed without OT assist. OT reviewed home set-up and home modifications for BADL participation in home environment. Pt left semi-reclined in bed with needs met.    Patient has met 9 of 9 long term goals due to improved activity tolerance, improved balance, postural control and ability to compensate for deficits.  Patient to discharge at overall Modified Independent level.  Patient's care partner is independent to provide the necessary physical assistance at discharge for higher level iADL tasks.    Reasons goals not met: N/A  Recommendation:  No further OT needs at this time  Equipment: No equipment provided  Reasons for discharge: treatment goals met and discharge from hospital  Patient/family agrees with progress made and goals achieved: Yes  OT Discharge Precautions/Restrictions  Precautions Precautions: Fall Precaution Comments: R neph tube Restrictions Weight Bearing Restrictions: No General OT Amount of Missed Time: 15 Minutes PT Missed Treatment Reason: Pain Pain Pain Assessment Pain Scale: 0-10 Pain Score: 6 Faces Pain Scale: Hurts a little bit Pain Type: Acute pain Pain Location: Bladder Pain Frequency: Intermittent Pain  Intervention(s):Repositioned ADL ADL Equipment Provided: Reacher, Sock aid Eating: Independent Grooming: Independent Upper Body Bathing: Independent Lower Body Bathing: Modified independent Upper Body Dressing: Independent Lower Body Dressing: Modified independent Toileting: Modified independent Toilet Transfer: Modified independent Toilet Transfer Method: Ambulating Tub/Shower Transfer: Unable to assess Social research officer, government: Unable to assess Perception  Perception: Within Functional Limits Praxis Praxis: Intact Cognition Overall Cognitive Status: Within Functional Limits for tasks assessed Arousal/Alertness: Awake/alert Orientation Level: Oriented X4 Memory: Appears intact Awareness: Appears intact Problem Solving: Appears intact Safety/Judgment: Appears intact Sensation Sensation Light Touch: Impaired Detail Peripheral sensation comments: Impaired distally>proximally  Light Touch Impaired Details: Impaired LUE;Impaired RUE;Impaired LLE;Impaired RLE Additional Comments: 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 Coordination Gross Motor Movements are Fluid and Coordinated: Yes Fine Motor Movements are Fluid and Coordinated: Yes Motor  Motor Motor: Within Functional Limits Motor - Discharge Observations: generalized weakness, neuropathy in B hands/feet Mobility  Bed Mobility Bed Mobility: Rolling Right;Rolling Left;Sit to Supine;Supine to Sit Rolling Right: Independent Rolling Left: Independent Supine to Sit: Independent Sit to Supine: Independent Transfers Sit to Stand: Independent with assistive device Stand to Sit: Independent with assistive device  Trunk/Postural Assessment  Cervical Assessment Cervical Assessment: Within Functional Limits Thoracic Assessment Thoracic Assessment: Exceptions to WFL(kyphotic) Lumbar Assessment Lumbar Assessment: Exceptions to WFL(posterior pelvic tilt) Postural Control Postural Control: Within Functional Limits   Balance Balance Balance Assessed: Yes Static Sitting Balance Static Sitting - Balance Support: No upper extremity supported;Feet supported Static Sitting - Level of Assistance: 7: Independent Dynamic Sitting Balance Dynamic Sitting - Balance Support: No upper extremity supported;Feet supported Dynamic Sitting - Level of Assistance: 7: Independent Static Standing Balance Static Standing - Balance Support: No upper extremity supported;During functional activity Static Standing - Level of  Assistance: 6: Modified independent (Device/Increase time) Dynamic Standing Balance Dynamic Standing - Balance Support: No upper extremity supported;During functional activity Dynamic Standing - Level of Assistance: 6: Modified independent (Device/Increase time) Extremity/Trunk Assessment RUE Assessment RUE Assessment: Within Functional Limits LUE Assessment LUE Assessment: Within Functional Limits   Daneen Schick Jordyne Poehlman 04/30/2019, 2:15 PM

## 2019-04-30 NOTE — Progress Notes (Signed)
Patient refused to have blood work drawn this morning.Reesa Chew PA notified.

## 2019-05-01 LAB — GLUCOSE, CAPILLARY: Glucose-Capillary: 92 mg/dL (ref 70–99)

## 2019-05-01 NOTE — Progress Notes (Signed)
Pt. Got d/c instructions,pt. Is ready to go home with her son.

## 2019-05-04 ENCOUNTER — Other Ambulatory Visit: Payer: Self-pay | Admitting: Urology

## 2019-05-04 ENCOUNTER — Telehealth: Payer: Self-pay

## 2019-05-04 ENCOUNTER — Other Ambulatory Visit (HOSPITAL_COMMUNITY): Payer: Self-pay | Admitting: Urology

## 2019-05-04 DIAGNOSIS — R102 Pelvic and perineal pain: Secondary | ICD-10-CM

## 2019-05-04 NOTE — Progress Notes (Signed)
Social Work Discharge Note  The overall goal for the admission was met for:   Discharge location: Yes - to her home  Length of Stay: Yes - 10 days  Discharge activity level: Yes - mod I/supervision  Home/community participation: Yes  Services provided included: MD, RD, PT, OT, RN, Pharmacy and [place]: Private Insurance: [employer]  Follow-up services arranged: Home Health: RN/PT/OT, DME: pt has all recommended DME at home already and Patient/Family request agency HH: [facility], DME: none  Comments (or additional information):  Estoria Geary - son - [phone];  Deetra Booton - dtr-in-law [phone]  Patient/Family verbalized understanding of follow-up arrangements: Yes  Individual responsible for coordination of the follow-up plan: pt and her family  Confirmed correct DME delivered: Trey Sailors 05/04/2019    Raybon Conard, Silvestre Mesi

## 2019-05-04 NOTE — Telephone Encounter (Signed)
Transitional Care Call---Patient    1. Are you/is patient experiencing any problems since coming home? No Are there any questions regarding any aspect of care? No 2. Are there any questions regarding medications administration/dosing? No Are meds being taken as prescribed? Yes, will pick up some today that had to be ordered Patient should review meds with caller to confirm 3. Have there been any falls? No 4. Has Home Health been to the house and/or have they contacted you? Yes If not, have you tried to contact them? Can we help you contact them? 5. Are bowels and bladder emptying properly? Yes Are there any unexpected incontinence issues? No If applicable, is patient following bowel/bladder programs? 6. Any fevers, problems with breathing, unexpected pain? No 7. Are there any skin problems or new areas of breakdown? Legs swollen and red, f/u with PCP 8. Has the patient/family member arranged specialty MD follow up (ie cardiology/neurology/renal/surgical/etc)?  Yes Can we help arrange? 9. Does the patient need any other services or support that we can help arrange? No 10. Are caregivers following through as expected in assisting the patient? Yes 11. Has the patient quit smoking, drinking alcohol, or using drugs as recommended? Yes  Appointment time 9:20 am arrive time 9:00 am with Zella Ball then Dr. Posey Pronto [address] [phone]

## 2019-05-07 ENCOUNTER — Other Ambulatory Visit (HOSPITAL_COMMUNITY): Payer: Self-pay | Admitting: Urology

## 2019-05-07 ENCOUNTER — Other Ambulatory Visit: Payer: Self-pay | Admitting: Internal Medicine

## 2019-05-07 ENCOUNTER — Other Ambulatory Visit: Payer: Self-pay

## 2019-05-07 DIAGNOSIS — Q6211 Congenital occlusion of ureteropelvic junction: Secondary | ICD-10-CM

## 2019-05-10 ENCOUNTER — Other Ambulatory Visit: Payer: Self-pay | Admitting: Internal Medicine

## 2019-05-10 ENCOUNTER — Ambulatory Visit (INDEPENDENT_AMBULATORY_CARE_PROVIDER_SITE_OTHER): Payer: Medicare Other | Admitting: Cardiology

## 2019-05-10 ENCOUNTER — Other Ambulatory Visit: Payer: Self-pay

## 2019-05-10 ENCOUNTER — Ambulatory Visit
Admission: RE | Admit: 2019-05-10 | Discharge: 2019-05-10 | Disposition: A | Discharge status: Home / Self Care | Payer: Medicare Other | Source: Ambulatory Visit | Attending: Internal Medicine | Admitting: Internal Medicine

## 2019-05-10 ENCOUNTER — Encounter: Payer: Self-pay | Admitting: Cardiology

## 2019-05-10 VITALS — BP 142/68 | HR 72 | Ht 66.0 in | Wt 173.0 lb

## 2019-05-10 DIAGNOSIS — I129 Hypertensive chronic kidney disease with stage 1 through stage 4 chronic kidney disease, or unspecified chronic kidney disease: Secondary | ICD-10-CM | POA: Diagnosis not present

## 2019-05-10 DIAGNOSIS — L03119 Cellulitis of unspecified part of limb: Secondary | ICD-10-CM

## 2019-05-10 DIAGNOSIS — I441 Atrioventricular block, second degree: Secondary | ICD-10-CM

## 2019-05-10 DIAGNOSIS — N184 Chronic kidney disease, stage 4 (severe): Secondary | ICD-10-CM

## 2019-05-10 DIAGNOSIS — I5032 Chronic diastolic (congestive) heart failure: Secondary | ICD-10-CM | POA: Diagnosis not present

## 2019-05-10 DIAGNOSIS — Z950 Presence of cardiac pacemaker: Secondary | ICD-10-CM

## 2019-05-10 DIAGNOSIS — I10 Essential (primary) hypertension: Secondary | ICD-10-CM

## 2019-05-10 DIAGNOSIS — N1330 Unspecified hydronephrosis: Secondary | ICD-10-CM

## 2019-05-10 DIAGNOSIS — R600 Localized edema: Secondary | ICD-10-CM

## 2019-05-10 MED ORDER — FUROSEMIDE 80 MG PO TABS: 80.0000 mg | ORAL_TABLET | Freq: Two times a day (BID) | ORAL | 1 refills | Status: DC

## 2019-05-10 NOTE — Progress Notes (Signed)
Primary Physician/Referring:  [name], MD  Patient ID: Erika Fry, female    DOB: [birth year], 73 y.o.   MRN: [id number]  Chief Complaint  Patient presents with  . Leg Swelling    follow up    HPI: Erika Fry  is a 73 y.o. female  with hypertension, CKD IV, type 2 DM, hyperlipidemia, s/p Medtronic dual chamber pacemaker for symptomatic high grade AV block, hypothyroidism, anemia, osteopenia, venous insufficiency.  Patient was recently admitted to the hospital in mid March 2020 with complaints of leg edema.  She was found to have acute kidney injury, thought to be combination of worsening CKD as well as nephrolithiasis and right-sided hydronephrosis.  She was also found to have UTI secondary to Enterobacter cloacae and faecalis. Creatinine increased to above 4, but improved to 3.1 by discharge. Echocardiogram showed grade 2 diastolic dysfunction, severe LA enlargement, small posterior pericardial effusion. Workup in the hospital was also notable for elevated kappa, lamda, as well as Kappa/lambda ratio, without M spike.  Since returning from rehab, patient states that her leg edema has returned and now has been seeping fluid from her leg.  She has also developed superficial ulceration and redness.  No fever or chills.  States that she is watching her diet.  Past Medical History:  Diagnosis Date  . Arthritis    "knees, thumbs" (03/25/2018)  . CKD (chronic kidney disease), stage IV ([place])   . Diet-controlled diabetes mellitus ([place])   . GERD (gastroesophageal reflux disease)   . Gout    "on daily RX" (03/25/2018)  . Heart murmur   . High cholesterol   . Hypertension   . Hypothyroidism   . Iron deficiency anemia    "had to get an iron infusion"  . Migraine    "used to have them growing up" (03/25/2018)  . Presence of permanent cardiac pacemaker 03/25/2018    Past Surgical History:  Procedure Laterality Date  . APPENDECTOMY    . BIOPSY  05/19/2018   Procedure:  BIOPSY;  Surgeon: Ladene Artist, MD;  Location: [place] [facility];  Service: Endoscopy;;  . Consuela Mimes W/ RETROGRADES Bilateral 01/14/2019   Procedure: CYSTOSCOPY WITH RETROGRADE PYELOGRAM BILATERAL HYDRODISTENTION;  Surgeon: Ardis Hughs, MD;  Location: [facility];  Service: Urology;  Laterality: Bilateral;  . ESOPHAGOGASTRODUODENOSCOPY N/A 05/19/2018   Procedure: ESOPHAGOGASTRODUODENOSCOPY (EGD);  Surgeon: Ladene Artist, MD;  Location[facility]COPY;  Service: Endoscopy;  Laterality: N/A;  . ESOPHAGOGASTRODUODENOSCOPY (EGD) WITH PROPOFOL N/A 01/28/2019   Procedure: ESOPHAGOGASTRODUODENOSCOPY (EGD) WITH PROPOFOL;  Surgeon: Rush Landmark Telford Nab., MD;  Location: [place];  Service: Gastroenterology;  Laterality: N/A;  . INSERT / REPLACE / REMOVE PACEMAKER  03/25/2018  . IR FLUORO GUIDE CV LINE RIGHT  05/18/2018  . IR LUMBAR DISC ASPIRATION W/IMG GUIDE  05/15/2018  . IR NEPHROSTOMY PLACEMENT RIGHT  04/09/2019  . IR REMOVAL TUN CV CATH W/O FL  07/23/2018  . IR US GUIDE VASC ACCESS RIGHT  05/18/2018  . KNEE ARTHROSCOPY Bilateral   . PACEMAKER IMPLANT N/A 03/25/2018   Procedure: PACEMAKER IMPLANT;  Surgeon: Evans Lance, MD;  Location: [facility];  Service: Cardiovascular;  Laterality: N/A;  . PACEMAKER PLACEMENT Left 03/2018  . POLYPECTOMY  01/28/2019   Procedure: POLYPECTOMY;  Surgeon: Mansouraty, Telford Nab., MD;  Location: [place];  Service: Gastroenterology;;  . RIGHT HEART CATH N/A 03/20/2018   Procedure: RIGHT HEART CATH;  Surgeon: Nigel Mormon, MD;  Location: [facility];  Service: Cardiovascular;  Laterality: N/A;  . TEE WITHOUT CARDIOVERSION N/A 03/20/2018   Procedure: TRANSESOPHAGEAL ECHOCARDIOGRAM (TEE);  Surgeon: Nigel Mormon, MD;  Location: [facility]COPY;  Service: Cardiovascular;  Laterality: N/A;  . TONSILLECTOMY    . URETEROSCOPY WITH HOLMIUM LASER LITHOTRIPSY Right 01/14/2019   Procedure: URETEROSCOPY WITH HOLMIUM LASER LITHOTRIPSY BLADDER  BIOPSIES RIGHT STENT PLACEMENT;  Surgeon: Ardis Hughs, MD;  Location: [facility];  Service: Urology;  Laterality: Right;    Social History   Socioeconomic History  . Marital status: Widowed    Spouse name: Not on file  . Number of children: 3  . Years of education: Not on file  . Highest education level: Not on file  Occupational History  . Not on file  Social Needs  . Financial resource strain: Not on file  . Food insecurity:    Worry: Not on file    Inability: Not on file  . Transportation needs:    Medical: Not on file    Non-medical: Not on file  Tobacco Use  . Smoking status: Never Smoker  . Smokeless tobacco: Never Used  Substance and Sexual Activity  . Alcohol use: Not Currently  . Drug use: Never  . Sexual activity: Not Currently  Lifestyle  . Physical activity:    Days per week: Not on file    Minutes per session: Not on file  . Stress: Not on file  Relationships  . Social connections:    Talks on phone: Not on file    Gets together: Not on file    Attends religious service: Not on file    Active member of club or organization: Not on file    Attends meetings of clubs or organizations: Not on file    Relationship status: Not on file  . Intimate partner violence:    Fear of current or ex partner: Not on file    Emotionally abused: Not on file    Physically abused: Not on file    Forced sexual activity: Not on file  Other Topics Concern  . Not on file  Social History Narrative  . Not on file    Review of Systems  Constitution: Positive for weight gain. Negative for chills, decreased appetite and malaise/fatigue.  Cardiovascular: Positive for dyspnea on exertion and leg swelling. Negative for syncope.  Endocrine: Negative for cold intolerance.  Hematologic/Lymphatic: Does not bruise/bleed easily.  Musculoskeletal: Positive for arthritis and muscle weakness (uses a walker). Negative for joint swelling.  Gastrointestinal: Negative for abdominal pain,  anorexia, change in bowel habit, hematochezia and melena.  Neurological: Negative for headaches and light-headedness.  Psychiatric/Behavioral: Negative for depression and substance abuse.  All other systems reviewed and are negative.     Objective  Blood pressure (!) 142/68, pulse 72, height 5\' 6"  (1.676 m), weight 173 lb (78.5 kg), SpO2 96 %. Body mass index is 27.92 kg/m.    Physical Exam  Constitutional: She appears well-developed and well-nourished. No distress.  HENT:  Head: Atraumatic.  Eyes: Conjunctivae are normal.  Neck: Neck supple. No JVD present. No thyromegaly present.  Cardiovascular: Normal rate, regular rhythm, S1 normal, S2 normal and intact distal pulses. Exam reveals distant heart sounds. Exam reveals no gallop.  No murmur heard. Pulses:      Carotid pulses are 2+ on the right side and 2+ on the left side.      Femoral pulses are 0 on the right side and 0 on the left side.      Popliteal pulses  are 0 on the right side and 0 on the left side.       Dorsalis pedis pulses are 1+ on the right side and 1+ on the left side.       Posterior tibial pulses are 0 on the right side and 0 on the left side.  There is 4+ bilateral leg edema with cellulitis changes.  I am able to feel the dorsalis pedis pulse, femoral and popliteal pulse could not be felt due to her body habitus.  Faint bilateral carotid bruit heard.  Pulmonary/Chest: Effort normal and breath sounds normal.  Abdominal: Soft. Bowel sounds are normal.  Urostomy drain noted  Musculoskeletal: Normal range of motion.  Neurological: She is alert.  Skin: Skin is warm and dry.  Psychiatric: She has a normal mood and affect.   Radiology: Ct Abdomen Pelvis Wo Contrast  Result Date: 05/10/2019 CLINICAL DATA:  Right-sided hydronephrosis follow-up status post nephrostomy tube placement. History of obstructing kidney stones. EXAM: CT ABDOMEN AND PELVIS WITHOUT CONTRAST TECHNIQUE: Multidetector CT imaging of the abdomen and  pelvis was performed following the standard protocol without IV contrast. COMPARISON:  CT abdomen pelvis dated February 10, 2019. FINDINGS: Lower chest: No acute abnormality. Hepatobiliary: No focal liver abnormality is seen. Unchanged layering gallbladder sludge or calculi. No wall thickening or biliary dilatation. Pancreas: Atrophic. No pancreatic ductal dilatation or surrounding inflammatory changes. Spleen: Normal in size without focal abnormality. Adrenals/Urinary Tract: The adrenal glands are unremarkable. Unchanged 5 mm nonobstructive calculus in the upper pole of the left kidney. Tiny subcentimeter low-density lesion in the medial left kidney remains too small to characterize, but is unchanged. Unchanged 1.1 cm hemorrhagic cyst in the lower pole of the left kidney. Right percutaneous nephrostomy tube in place with resolved hydronephrosis. Similar appearing nonobstructive calculi in the lower pole of the right kidney measuring up to 10 mm. The bladder is decompressed. Stomach/Bowel: Stomach is within normal limits. Reported history of prior appendectomy. No evidence of bowel wall thickening, distention, or inflammatory changes. Vascular/Lymphatic: Aortic atherosclerosis. No enlarged abdominal or pelvic lymph nodes. Reproductive: Uterus and bilateral adnexa are unremarkable. Other: No abdominal wall hernia or abnormality. No abdominopelvic ascites. No pneumoperitoneum. Musculoskeletal: No acute or significant osseous findings. IMPRESSION: 1. Right percutaneous nephrostomy tube in place with resolved right hydronephrosis. 2. Similar appearing bilateral nonobstructive nephrolithiasis. 3.  Aortic atherosclerosis (ICD10-I70.0). Electronically Signed   By: Titus Dubin M.D.   On: 05/10/2019 15:22    Laboratory examination:   CMP Latest Ref Rng & Units 04/27/2019 04/22/2019 04/21/2019  Glucose 70 - 99 mg/dL 109(H) 118(H) 98  BUN 8 - 23 mg/dL 48(H) 69(H) 70(H)  Creatinine 0.44 - 1.00 mg/dL 3.54(H) 4.90(H)  4.63(H)  Sodium 135 - 145 mmol/L 132(L) 132(L) 132(L)  Potassium 3.5 - 5.1 mmol/L 4.9 4.4 5.0  Chloride 98 - 111 mmol/L 98 99 97(L)  CO2 22 - 32 mmol/L 23 23 24   Calcium 8.9 - 10.3 mg/dL 9.9 8.8(L) 8.9  Total Protein 6.5 - 8.1 g/dL - 4.6(L) -  Total Bilirubin 0.3 - 1.2 mg/dL - 0.5 -  Alkaline Phos 38 - 126 U/L - 84 -  AST 15 - 41 U/L - 14(L) -  ALT 0 - 44 U/L - 10 -   CBC Latest Ref Rng & Units 04/27/2019 04/22/2019 04/15/2019  WBC 4.0 - 10.5 K/uL 5.5 7.6 6.8  Hemoglobin 12.0 - 15.0 g/dL 8.9(L) 7.0(L) 8.2(L)  Hematocrit 36.0 - 46.0 % 28.2(L) 21.9(L) 25.0(L)  Platelets 150 - 400 K/uL 376  245 81(L)   Lipid Panel  No results found for: CHOL, TRIG, HDL, CHOLHDL, VLDL, LDLCALC, LDLDIRECT HEMOGLOBIN A1C Lab Results  Component Value Date   HGBA1C 6.3 (H) 04/30/2018   MPG 134.11 04/30/2018   TSH Recent Labs    05/29/18 0946  TSH 0.706   PRN Meds:. Medications Discontinued During This Encounter  Medication Reason  . furosemide (LASIX) 80 MG tablet Reorder   Current Meds  Medication Sig  . ALOE VERA PO Take 3 capsules by mouth 2 (two) times a day.  . Biotin 5 MG CAPS Take 1 capsule (5 mg total) by mouth daily.  . cetirizine (ZYRTEC) 10 MG tablet Take 10 mg by mouth at bedtime.   [name] Kitchen darifenacin (ENABLEX) 15 MG 24 hr tablet Take 1 tablet (15 mg total) by mouth daily.  [name] Kitchen docusate sodium (COLACE) 100 MG capsule Take 1 capsule (100 mg total) by mouth 2 (two) times daily.  [name] Kitchen doxycycline 100 mg in sodium chloride 0.9 % 250 mL Take 100 mg by mouth 2 (two) times a day.  . ferrous sulfate 325 (65 FE) MG tablet Take 1 tablet (325 mg total) by mouth 2 (two) times daily with a meal.  . flavoxATE (URISPAS) 100 MG tablet Take 1 tablet (100 mg total) by mouth 3 (three) times daily as needed (bladder spasms, dysuria).  . fluticasone (FLONASE) 50 MCG/ACT nasal spray Place 2 sprays into both nostrils daily as needed for allergies or rhinitis.  . folic acid (FOLVITE) 1 MG tablet Take 1 tablet (1 mg  total) by mouth daily.  . furosemide (LASIX) 80 MG tablet Take 1 tablet (80 mg total) by mouth 2 (two) times daily.  [name] Kitchen gabapentin (NEURONTIN) 100 MG capsule Take 1 capsule (100 mg total) by mouth 3 (three) times daily. (Patient taking differently: Take 100 mg by mouth daily. )  . levothyroxine (SYNTHROID) 75 MCG tablet Take 1 tablet (75 mcg total) by mouth daily before breakfast.  . Menthol-Methyl Salicylate (MUSCLE RUB) 10-15 % CREA Apply 1 application topically every 4 (four) hours as needed for muscle pain.  . Multiple Vitamin (MULTI-VITAMIN PO) Take 1 tablet by mouth daily.  [name] Kitchen oxyCODONE (OXY IR/ROXICODONE) 5 MG immediate release tablet Take 1 tablet (5 mg total) by mouth every 6 (six) hours as needed for severe pain.  . polyethylene glycol (MIRALAX / GLYCOLAX) 17 g packet Take 17 g by mouth 2 (two) times daily.  . simvastatin (ZOCOR) 40 MG tablet Take 1 tablet (40 mg total) by mouth at bedtime.  . sodium bicarbonate 650 MG tablet Take 2 tablets (1,300 mg total) by mouth 2 (two) times daily.  . vitamin B-12 (CYANOCOBALAMIN) 1000 MCG tablet Take 1 tablet (1,000 mcg total) by mouth daily.  . [DISCONTINUED] furosemide (LASIX) 80 MG tablet Take 1 tablet by mouth daily.    Cardiac Studies:  Hospital echocardiogram 02/16/2019:  1. The left ventricle has normal systolic function, with an ejection fraction of 55-60%. The cavity size was normal. There is moderately increased left ventricular wall thickness. Left ventricular diastolic Doppler parameters are consistent with  impaired relaxation. Elevated left atrial and left ventricular end-diastolic pressures The E/e' is >15. No evidence of left ventricular regional wall motion abnormalities.  2. The right ventricle has normal systolic function. The cavity was normal. There is no increase in right ventricular wall thickness.  3. Left atrial size was severely dilated.  4. Right atrial size was mildly dilated.  5. The mitral valve is degenerative. Mild  thickening of  the mitral valve leaflet. There is mild to moderate mitral annular calcification present.  6. The aortic valve is tricuspid Mild sclerosis of the aortic valve. Aortic valve regurgitation was not assessed by color flow Doppler.  7. The inferior vena cava was dilated in size with >50% respiratory variability.  8. Small pericardial effusion.  9. The pericardial effusion is circumferential.  Carotid artery duplex 03/10/2018: Minimal stenosis in the right internal carotid artery (1-15%). Stenosis in the left internal carotid artery (16-49%). Antegrade right vertebral artery flow. Antegrade left vertebral artery flow. Follow up in one year is appropriate if clinically indicated.  TEE 03/20/2018: - Left ventricle: There was moderate concentric hypertrophy with  severe basal septal hypertrophy- likely reason for LVOT gradient.  No SAM, no sub aortic membrane. Hyperdynamic LV> LVEF >70%. Wall  motion was normal; there were no regional wall motion  abnormalities. - Aortic valve: Mild annular calcification. Normal leaflets with normal excursion. Mean PG 33 mmHg, Vmax 4 m/sec, however gradient   arising throughout LV outflow tract without significant aortic   valvular stenosis. AVA by continuity equation at least 1.4 cm2. - Mitral valve: Mildly to moderately calcified annulus. Normal   thickness leaflets . There was mild regurgitation. - Left atrium: No evidence of thrombus in the atrial cavity or   appendage.   Impressions:  - Hyperdynamic LV with severe basal septal hypertrophy and LVOT   obstruction. No significant valvular stenosis.  Right heart cath 04/19: RA pressure 3 mmhg RVSP 48 mmHg, RVEDP 6 mmhg PAP 42/9 mmhg, Mean Pap 20 mmhg CO 6.8 L/min, CI 3.3 L/min/m2 No pulmonary hypertension. Normal cardiac output  Lexiscan myoview stress test 02/13/2018:  1. Pharmacologic stress testing was performed with intravenous administration of .4 mg of Lexiscan over a 10-15 seconds infusion.  Resting hypertension 180/84 mmHg. Exercise capacity not assessed. Stress symptoms included dyspnea, dizziness. Stress EKG is non diagnostic for ischemia as it is a pharmacologic stress.  2. The overall quality of the study is excellent. There is no evidence of abnormal lung activity. Stress and rest SPECT images demonstrate homogeneous tracer distribution throughout the myocardium. Gated SPECT imaging reveals normal myocardial thickening and wall motion. The left ventricular ejection fraction was normal (67%).   3. Low risk study. Overall Impression:  Poor exercise capacity. Inadequate assessment for inotropic competense, limited due to poor functional capacity.   Recoomendation: Consider Holter/event monitor if high clinical suspicion for high grade AV block . Continue primary/secondary prevention.  Assessment   Chronic heart failure with preserved ejection fraction (HCC)  Essential hypertension  CKD (chronic kidney disease), stage IV (HCC)  Mobitz type 2 second degree AV block  Cardiac pacemaker in situ  Bilateral leg edema - Plan: furosemide (LASIX) 80 MG tablet  Cellulitis of lower extremity, unspecified laterality  Remote 4.23.20: Normal function. Battery longevity is 7.0-7.7 years. RA pacing is 0.7 %, RV pacing is 99.1 %.  EKG 01/16/2019: Sinus tachycardia at rate of 103 bpm, ventricularly paced.  No further analysis.  Recommendations:   Extremely complex individual with multiple medical comorbidity including stage IV chronic kidney disease, Second-degree heart block, chronic diastolic heart failure who now has developed cellulitis of both lower extremity and 3-4+ leg edema.  Most of the symptoms are related to patient's dependent edema, she is essentially wheelchair-bound/sits on a recliner, advised her complete bedrest for the next 10 days so she can keep her feet elevated.  Blood pressure is mildly elevated but hopefully with furosemide it will also improve.  Would  like to  keep the pressures slightly higher in view of renal insufficiency.  She continues to have frequency of urination, she is presently on antibiotics, continue the same, this should also help with cellulitis.  We will increase furosemide 80 mg from once a day to b.i.d. dosing.  She does have a appointment to see her nephrologist next week and I'll keep her informed.  I do not think she is in acute decompensated heart failure.  Lungs are clear to auscultate.  I'll like to see her back in 4 weeks.  Pacemaker is functioning normally.  Adrian Prows, MD, [facility] 05/10/2019, 5:41 PM [facility] [place] Pager: [phone] Office: [phone] If no answer Cell [phone]

## 2019-05-12 ENCOUNTER — Encounter: Payer: Medicare Other | Admitting: Registered Nurse

## 2019-05-21 ENCOUNTER — Other Ambulatory Visit (HOSPITAL_COMMUNITY): Payer: Medicare Other

## 2019-05-24 ENCOUNTER — Ambulatory Visit (HOSPITAL_COMMUNITY)
Admission: RE | Admit: 2019-05-24 | Discharge: 2019-05-24 | Disposition: A | Discharge status: Home / Self Care | Payer: Medicare Other | Source: Ambulatory Visit | Attending: Internal Medicine | Admitting: Internal Medicine

## 2019-05-24 ENCOUNTER — Other Ambulatory Visit: Payer: Self-pay

## 2019-05-24 VITALS — BP 110/70 | HR 54 | Temp 97.2°F | Resp 20

## 2019-05-24 DIAGNOSIS — N184 Chronic kidney disease, stage 4 (severe): Secondary | ICD-10-CM | POA: Diagnosis not present

## 2019-05-24 LAB — POCT HEMOGLOBIN-HEMACUE: Hemoglobin: 10.4 g/dL — ABNORMAL LOW (ref 12.0–15.0)

## 2019-05-24 MED ORDER — EPOETIN ALFA-EPBX 40000 UNIT/ML IJ SOLN
25000.0000 [IU] | INTRAMUSCULAR | Status: DC
  Administered 2019-05-24: 25000 [IU] via SUBCUTANEOUS
  Filled 2019-05-24: qty 1

## 2019-05-25 ENCOUNTER — Ambulatory Visit (HOSPITAL_COMMUNITY): Admission: RE | Admit: 2019-05-25 | Payer: Medicare Other | Source: Ambulatory Visit

## 2019-05-28 ENCOUNTER — Other Ambulatory Visit: Payer: Self-pay

## 2019-05-28 ENCOUNTER — Encounter (HOSPITAL_COMMUNITY): Payer: Self-pay | Admitting: Interventional Radiology

## 2019-05-28 ENCOUNTER — Ambulatory Visit (HOSPITAL_COMMUNITY)
Admission: RE | Admit: 2019-05-28 | Discharge: 2019-05-28 | Disposition: A | Discharge status: Home / Self Care | Payer: Medicare Other | Source: Ambulatory Visit | Attending: Urology | Admitting: Urology

## 2019-05-28 DIAGNOSIS — Z436 Encounter for attention to other artificial openings of urinary tract: Secondary | ICD-10-CM | POA: Insufficient documentation

## 2019-05-28 DIAGNOSIS — Q6211 Congenital occlusion of ureteropelvic junction: Secondary | ICD-10-CM | POA: Diagnosis not present

## 2019-05-28 HISTORY — PX: IR NEPHROSTOMY EXCHANGE RIGHT: IMG6070

## 2019-05-28 MED ORDER — LIDOCAINE HCL 1 % IJ SOLN
INTRAMUSCULAR | Status: DC | PRN
  Administered 2019-05-28: 5 mL

## 2019-05-28 MED ORDER — IOHEXOL 300 MG/ML  SOLN
50.0000 mL | Freq: Once | INTRAMUSCULAR | Status: AC | PRN
  Administered 2019-05-28: 10 mL

## 2019-05-28 NOTE — Procedures (Signed)
Chronic right hydro  S/p fluoro rt pcn exchg  No comp Stable ebl 0 Gravity drainage Full report in pacs

## 2019-05-31 ENCOUNTER — Other Ambulatory Visit (HOSPITAL_COMMUNITY): Payer: Self-pay | Admitting: Interventional Radiology

## 2019-05-31 DIAGNOSIS — N130 Hydronephrosis with ureteropelvic junction obstruction: Secondary | ICD-10-CM

## 2019-06-07 ENCOUNTER — Other Ambulatory Visit: Payer: Self-pay

## 2019-06-07 ENCOUNTER — Ambulatory Visit (HOSPITAL_COMMUNITY)
Admission: RE | Admit: 2019-06-07 | Discharge: 2019-06-07 | Disposition: A | Discharge status: Home / Self Care | Payer: Medicare Other | Source: Ambulatory Visit | Attending: Internal Medicine | Admitting: Internal Medicine

## 2019-06-07 VITALS — BP 125/66 | HR 83 | Temp 97.3°F | Resp 20

## 2019-06-07 DIAGNOSIS — N184 Chronic kidney disease, stage 4 (severe): Secondary | ICD-10-CM | POA: Insufficient documentation

## 2019-06-07 DIAGNOSIS — I132 Hypertensive heart and chronic kidney disease with heart failure and with stage 5 chronic kidney disease, or end stage renal disease: Secondary | ICD-10-CM | POA: Diagnosis not present

## 2019-06-07 DIAGNOSIS — I5033 Acute on chronic diastolic (congestive) heart failure: Secondary | ICD-10-CM | POA: Diagnosis not present

## 2019-06-07 LAB — POCT HEMOGLOBIN-HEMACUE: Hemoglobin: 9.9 g/dL — ABNORMAL LOW (ref 12.0–15.0)

## 2019-06-07 MED ORDER — EPOETIN ALFA-EPBX 10000 UNIT/ML IJ SOLN
10000.0000 [IU] | INTRAMUSCULAR | Status: DC
  Filled 2019-06-07: qty 1

## 2019-06-07 MED ORDER — EPOETIN ALFA-EPBX 10000 UNIT/ML IJ SOLN
10000.0000 [IU] | INTRAMUSCULAR | Status: DC
  Administered 2019-06-07: 14:00:00 25000 [IU] via SUBCUTANEOUS
  Filled 2019-06-07: qty 2.5

## 2019-06-07 MED ORDER — EPOETIN ALFA-EPBX 40000 UNIT/ML IJ SOLN: 25000.0000 [IU] | INTRAMUSCULAR | Status: DC

## 2019-06-07 MED ORDER — EPOETIN ALFA-EPBX 10000 UNIT/ML IJ SOLN
5000.0000 [IU] | INTRAMUSCULAR | Status: DC
  Filled 2019-06-07: qty 1

## 2019-06-09 ENCOUNTER — Emergency Department (HOSPITAL_COMMUNITY): Payer: Medicare Other

## 2019-06-09 ENCOUNTER — Inpatient Hospital Stay (HOSPITAL_COMMUNITY)
Admission: EM | Admit: 2019-06-09 | Discharge: 2019-06-30 | DRG: 264 | Disposition: A | Discharge status: Home Health | Payer: Medicare Other | Attending: Internal Medicine | Admitting: Internal Medicine

## 2019-06-09 ENCOUNTER — Encounter (HOSPITAL_COMMUNITY): Payer: Self-pay | Admitting: *Deleted

## 2019-06-09 DIAGNOSIS — E039 Hypothyroidism, unspecified: Secondary | ICD-10-CM

## 2019-06-09 DIAGNOSIS — M79604 Pain in right leg: Secondary | ICD-10-CM

## 2019-06-09 DIAGNOSIS — K219 Gastro-esophageal reflux disease without esophagitis: Secondary | ICD-10-CM

## 2019-06-09 DIAGNOSIS — E785 Hyperlipidemia, unspecified: Secondary | ICD-10-CM | POA: Diagnosis present

## 2019-06-09 DIAGNOSIS — N3010 Interstitial cystitis (chronic) without hematuria: Secondary | ICD-10-CM | POA: Diagnosis present

## 2019-06-09 DIAGNOSIS — E1122 Type 2 diabetes mellitus with diabetic chronic kidney disease: Secondary | ICD-10-CM | POA: Diagnosis present

## 2019-06-09 DIAGNOSIS — N390 Urinary tract infection, site not specified: Secondary | ICD-10-CM | POA: Diagnosis present

## 2019-06-09 DIAGNOSIS — M79605 Pain in left leg: Secondary | ICD-10-CM

## 2019-06-09 DIAGNOSIS — I5032 Chronic diastolic (congestive) heart failure: Secondary | ICD-10-CM | POA: Diagnosis not present

## 2019-06-09 DIAGNOSIS — R509 Fever, unspecified: Secondary | ICD-10-CM

## 2019-06-09 DIAGNOSIS — Z992 Dependence on renal dialysis: Secondary | ICD-10-CM

## 2019-06-09 DIAGNOSIS — R238 Other skin changes: Secondary | ICD-10-CM | POA: Diagnosis present

## 2019-06-09 DIAGNOSIS — Z20828 Contact with and (suspected) exposure to other viral communicable diseases: Secondary | ICD-10-CM | POA: Diagnosis present

## 2019-06-09 DIAGNOSIS — N3090 Cystitis, unspecified without hematuria: Secondary | ICD-10-CM

## 2019-06-09 DIAGNOSIS — I441 Atrioventricular block, second degree: Secondary | ICD-10-CM | POA: Diagnosis present

## 2019-06-09 DIAGNOSIS — Z888 Allergy status to other drugs, medicaments and biological substances status: Secondary | ICD-10-CM

## 2019-06-09 DIAGNOSIS — R531 Weakness: Secondary | ICD-10-CM

## 2019-06-09 DIAGNOSIS — R600 Localized edema: Secondary | ICD-10-CM

## 2019-06-09 DIAGNOSIS — I10 Essential (primary) hypertension: Secondary | ICD-10-CM

## 2019-06-09 DIAGNOSIS — N319 Neuromuscular dysfunction of bladder, unspecified: Secondary | ICD-10-CM | POA: Diagnosis present

## 2019-06-09 DIAGNOSIS — B3749 Other urogenital candidiasis: Secondary | ICD-10-CM | POA: Diagnosis present

## 2019-06-09 DIAGNOSIS — M7989 Other specified soft tissue disorders: Secondary | ICD-10-CM

## 2019-06-09 DIAGNOSIS — Z66 Do not resuscitate: Secondary | ICD-10-CM | POA: Diagnosis present

## 2019-06-09 DIAGNOSIS — Z79890 Hormone replacement therapy (postmenopausal): Secondary | ICD-10-CM

## 2019-06-09 DIAGNOSIS — N179 Acute kidney failure, unspecified: Secondary | ICD-10-CM | POA: Diagnosis present

## 2019-06-09 DIAGNOSIS — Z800 Family history of malignant neoplasm of digestive organs: Secondary | ICD-10-CM

## 2019-06-09 DIAGNOSIS — E871 Hypo-osmolality and hyponatremia: Secondary | ICD-10-CM | POA: Diagnosis present

## 2019-06-09 DIAGNOSIS — I5033 Acute on chronic diastolic (congestive) heart failure: Secondary | ICD-10-CM | POA: Diagnosis present

## 2019-06-09 DIAGNOSIS — R609 Edema, unspecified: Secondary | ICD-10-CM

## 2019-06-09 DIAGNOSIS — Z885 Allergy status to narcotic agent status: Secondary | ICD-10-CM

## 2019-06-09 DIAGNOSIS — E875 Hyperkalemia: Secondary | ICD-10-CM | POA: Diagnosis not present

## 2019-06-09 DIAGNOSIS — K5900 Constipation, unspecified: Secondary | ICD-10-CM | POA: Diagnosis not present

## 2019-06-09 DIAGNOSIS — D631 Anemia in chronic kidney disease: Secondary | ICD-10-CM | POA: Diagnosis present

## 2019-06-09 DIAGNOSIS — Z515 Encounter for palliative care: Secondary | ICD-10-CM | POA: Diagnosis present

## 2019-06-09 DIAGNOSIS — Z8249 Family history of ischemic heart disease and other diseases of the circulatory system: Secondary | ICD-10-CM

## 2019-06-09 DIAGNOSIS — E119 Type 2 diabetes mellitus without complications: Secondary | ICD-10-CM | POA: Diagnosis not present

## 2019-06-09 DIAGNOSIS — E7800 Pure hypercholesterolemia, unspecified: Secondary | ICD-10-CM

## 2019-06-09 DIAGNOSIS — Z936 Other artificial openings of urinary tract status: Secondary | ICD-10-CM

## 2019-06-09 DIAGNOSIS — E1140 Type 2 diabetes mellitus with diabetic neuropathy, unspecified: Secondary | ICD-10-CM | POA: Diagnosis present

## 2019-06-09 DIAGNOSIS — Z950 Presence of cardiac pacemaker: Secondary | ICD-10-CM

## 2019-06-09 DIAGNOSIS — Z7189 Other specified counseling: Secondary | ICD-10-CM

## 2019-06-09 DIAGNOSIS — N186 End stage renal disease: Secondary | ICD-10-CM

## 2019-06-09 DIAGNOSIS — N184 Chronic kidney disease, stage 4 (severe): Secondary | ICD-10-CM | POA: Diagnosis not present

## 2019-06-09 DIAGNOSIS — N3000 Acute cystitis without hematuria: Secondary | ICD-10-CM

## 2019-06-09 DIAGNOSIS — I132 Hypertensive heart and chronic kidney disease with heart failure and with stage 5 chronic kidney disease, or end stage renal disease: Principal | ICD-10-CM | POA: Diagnosis present

## 2019-06-09 DIAGNOSIS — I447 Left bundle-branch block, unspecified: Secondary | ICD-10-CM | POA: Diagnosis present

## 2019-06-09 DIAGNOSIS — Z79899 Other long term (current) drug therapy: Secondary | ICD-10-CM

## 2019-06-09 DIAGNOSIS — Z833 Family history of diabetes mellitus: Secondary | ICD-10-CM

## 2019-06-09 DIAGNOSIS — X16XXXA Contact with hot heating appliances, radiators and pipes, initial encounter: Secondary | ICD-10-CM | POA: Diagnosis present

## 2019-06-09 DIAGNOSIS — L03115 Cellulitis of right lower limb: Secondary | ICD-10-CM | POA: Diagnosis present

## 2019-06-09 DIAGNOSIS — T2105XA Burn of unspecified degree of buttock, initial encounter: Secondary | ICD-10-CM | POA: Diagnosis present

## 2019-06-09 DIAGNOSIS — D509 Iron deficiency anemia, unspecified: Secondary | ICD-10-CM

## 2019-06-09 DIAGNOSIS — L03116 Cellulitis of left lower limb: Secondary | ICD-10-CM | POA: Diagnosis present

## 2019-06-09 DIAGNOSIS — L03119 Cellulitis of unspecified part of limb: Secondary | ICD-10-CM | POA: Diagnosis present

## 2019-06-09 LAB — PROCALCITONIN: Procalcitonin: 0.44 ng/mL

## 2019-06-09 LAB — URINALYSIS, ROUTINE W REFLEX MICROSCOPIC
Bacteria, UA: NONE SEEN
Bilirubin Urine: NEGATIVE
Glucose, UA: NEGATIVE mg/dL
Ketones, ur: NEGATIVE mg/dL
Nitrite: NEGATIVE
Protein, ur: 30 mg/dL — AB
Specific Gravity, Urine: 1.009 (ref 1.005–1.030)
WBC, UA: >50 WBC/hpf — ABNORMAL HIGH (ref 0–5)
pH: 7 (ref 5.0–8.0)

## 2019-06-09 LAB — BASIC METABOLIC PANEL
Anion gap: 15 (ref 5–15)
BUN: 97 mg/dL — ABNORMAL HIGH (ref 8–23)
CO2: 20 mmol/L — ABNORMAL LOW (ref 22–32)
Calcium: 9.8 mg/dL (ref 8.9–10.3)
Chloride: 98 mmol/L (ref 98–111)
Creatinine, Ser: 4.07 mg/dL — ABNORMAL HIGH (ref 0.44–1.00)
GFR calc Af Amer: 12 mL/min — ABNORMAL LOW (ref >60)
GFR calc non Af Amer: 10 mL/min — ABNORMAL LOW (ref >60)
Glucose, Bld: 102 mg/dL — ABNORMAL HIGH (ref 70–99)
Potassium: 4.1 mmol/L (ref 3.5–5.1)
Sodium: 133 mmol/L — ABNORMAL LOW (ref 135–145)

## 2019-06-09 LAB — SEDIMENTATION RATE: Sed Rate: 56 mm/hr — ABNORMAL HIGH (ref 0–22)

## 2019-06-09 LAB — CBC WITH DIFFERENTIAL/PLATELET
Abs Immature Granulocytes: 0.04 10*3/uL (ref 0.00–0.07)
Basophils Absolute: 0.1 10*3/uL (ref 0.0–0.1)
Basophils Relative: 1 %
Eosinophils Absolute: 0.1 10*3/uL (ref 0.0–0.5)
Eosinophils Relative: 1 %
HCT: 30.3 % — ABNORMAL LOW (ref 36.0–46.0)
Hemoglobin: 9.9 g/dL — ABNORMAL LOW (ref 12.0–15.0)
Immature Granulocytes: 0 %
Lymphocytes Relative: 20 %
Lymphs Abs: 1.8 10*3/uL (ref 0.7–4.0)
MCH: 29.5 pg (ref 26.0–34.0)
MCHC: 32.7 g/dL (ref 30.0–36.0)
MCV: 90.2 fL (ref 80.0–100.0)
Monocytes Absolute: 0.8 10*3/uL (ref 0.1–1.0)
Monocytes Relative: 9 %
Neutro Abs: 6.2 10*3/uL (ref 1.7–7.7)
Neutrophils Relative %: 69 %
Platelets: 239 10*3/uL (ref 150–400)
RBC: 3.36 MIL/uL — ABNORMAL LOW (ref 3.87–5.11)
RDW: 13.5 % (ref 11.5–15.5)
WBC: 9 10*3/uL (ref 4.0–10.5)
nRBC: 0 % (ref 0.0–0.2)

## 2019-06-09 LAB — LACTIC ACID, PLASMA: Lactic Acid, Venous: 1.6 mmol/L (ref 0.5–1.9)

## 2019-06-09 LAB — CREATININE, SERUM
Creatinine, Ser: 4.01 mg/dL — ABNORMAL HIGH (ref 0.44–1.00)
GFR calc Af Amer: 12 mL/min — ABNORMAL LOW (ref >60)
GFR calc non Af Amer: 10 mL/min — ABNORMAL LOW (ref >60)

## 2019-06-09 LAB — BRAIN NATRIURETIC PEPTIDE: B Natriuretic Peptide: 138.5 pg/mL — ABNORMAL HIGH (ref 0.0–100.0)

## 2019-06-09 LAB — C-REACTIVE PROTEIN: CRP: <0.8 mg/dL (ref <1.0)

## 2019-06-09 LAB — SARS CORONAVIRUS 2 BY RT PCR (HOSPITAL ORDER, PERFORMED IN ~~LOC~~ HOSPITAL LAB): SARS Coronavirus 2: NEGATIVE

## 2019-06-09 MED ORDER — SIMVASTATIN 20 MG PO TABS
40.0000 mg | ORAL_TABLET | Freq: Every day | ORAL | Status: DC
  Administered 2019-06-09 – 2019-06-29 (×21): 40 mg via ORAL
  Filled 2019-06-09: qty 1
  Filled 2019-06-09: qty 2
  Filled 2019-06-09 (×4): qty 1
  Filled 2019-06-09: qty 2
  Filled 2019-06-09: qty 1
  Filled 2019-06-09: qty 2
  Filled 2019-06-09 (×2): qty 1
  Filled 2019-06-09: qty 2
  Filled 2019-06-09 (×3): qty 1
  Filled 2019-06-09 (×2): qty 2
  Filled 2019-06-09 (×4): qty 1

## 2019-06-09 MED ORDER — ONDANSETRON HCL 4 MG/2ML IJ SOLN: 4.0000 mg | Freq: Four times a day (QID) | INTRAMUSCULAR | Status: DC | PRN

## 2019-06-09 MED ORDER — MULTI-VITAMIN PO TABS: ORAL_TABLET | Freq: Every day | ORAL | Status: DC

## 2019-06-09 MED ORDER — VANCOMYCIN HCL 10 G IV SOLR
1750.0000 mg | Freq: Once | INTRAVENOUS | Status: AC
  Administered 2019-06-09: 1750 mg via INTRAVENOUS
  Filled 2019-06-09: qty 1750

## 2019-06-09 MED ORDER — FLUTICASONE PROPIONATE 50 MCG/ACT NA SUSP
2.0000 | Freq: Every day | NASAL | Status: DC | PRN
  Administered 2019-06-14: 2 via NASAL
  Filled 2019-06-09 (×2): qty 16

## 2019-06-09 MED ORDER — POLYETHYLENE GLYCOL 3350 17 G PO PACK: 17.0000 g | PACK | Freq: Every day | ORAL | Status: DC | PRN

## 2019-06-09 MED ORDER — HYDRALAZINE HCL 20 MG/ML IJ SOLN: 5.0000 mg | INTRAMUSCULAR | Status: DC | PRN

## 2019-06-09 MED ORDER — ACETAMINOPHEN 325 MG PO TABS
650.0000 mg | ORAL_TABLET | Freq: Four times a day (QID) | ORAL | Status: DC | PRN
  Administered 2019-06-10 – 2019-06-29 (×18): 650 mg via ORAL
  Filled 2019-06-09 (×18): qty 2

## 2019-06-09 MED ORDER — ADULT MULTIVITAMIN W/MINERALS CH
1.0000 | ORAL_TABLET | Freq: Every day | ORAL | Status: DC
  Administered 2019-06-10 – 2019-06-28 (×19): 1 via ORAL
  Filled 2019-06-09 (×19): qty 1

## 2019-06-09 MED ORDER — FERROUS SULFATE 325 (65 FE) MG PO TABS
325.0000 mg | ORAL_TABLET | Freq: Two times a day (BID) | ORAL | Status: DC
  Administered 2019-06-10 – 2019-06-15 (×11): 325 mg via ORAL
  Filled 2019-06-09 (×11): qty 1

## 2019-06-09 MED ORDER — HEPARIN SODIUM (PORCINE) 5000 UNIT/ML IJ SOLN
5000.0000 [IU] | Freq: Three times a day (TID) | INTRAMUSCULAR | Status: DC
  Administered 2019-06-09 – 2019-06-24 (×43): 5000 [IU] via SUBCUTANEOUS
  Filled 2019-06-09 (×43): qty 1

## 2019-06-09 MED ORDER — LORATADINE 10 MG PO TABS
10.0000 mg | ORAL_TABLET | Freq: Every day | ORAL | Status: DC
  Administered 2019-06-09 – 2019-06-30 (×22): 10 mg via ORAL
  Filled 2019-06-09 (×22): qty 1

## 2019-06-09 MED ORDER — ONDANSETRON HCL 4 MG PO TABS: 4.0000 mg | ORAL_TABLET | Freq: Four times a day (QID) | ORAL | Status: DC | PRN

## 2019-06-09 MED ORDER — SODIUM CHLORIDE 0.9 % IV SOLN
1.0000 g | INTRAVENOUS | Status: DC
  Administered 2019-06-09 – 2019-06-13 (×5): 1 g via INTRAVENOUS
  Filled 2019-06-09: qty 10
  Filled 2019-06-09 (×5): qty 1

## 2019-06-09 MED ORDER — FUROSEMIDE 10 MG/ML IJ SOLN
80.0000 mg | Freq: Two times a day (BID) | INTRAMUSCULAR | Status: DC
  Administered 2019-06-10 – 2019-06-12 (×6): 80 mg via INTRAVENOUS
  Filled 2019-06-09 (×6): qty 8

## 2019-06-09 MED ORDER — PANTOPRAZOLE SODIUM 40 MG PO TBEC
40.0000 mg | DELAYED_RELEASE_TABLET | Freq: Two times a day (BID) | ORAL | Status: DC
  Administered 2019-06-09 – 2019-06-15 (×12): 40 mg via ORAL
  Filled 2019-06-09 (×12): qty 1

## 2019-06-09 MED ORDER — VANCOMYCIN VARIABLE DOSE PER UNSTABLE RENAL FUNCTION (PHARMACIST DOSING): Status: DC

## 2019-06-09 MED ORDER — LEVOTHYROXINE SODIUM 75 MCG PO TABS
75.0000 ug | ORAL_TABLET | Freq: Every day | ORAL | Status: DC
  Administered 2019-06-10 – 2019-06-30 (×20): 75 ug via ORAL
  Filled 2019-06-09 (×20): qty 1

## 2019-06-09 MED ORDER — FLAVOXATE HCL 100 MG PO TABS
100.0000 mg | ORAL_TABLET | Freq: Three times a day (TID) | ORAL | Status: DC | PRN
  Administered 2019-06-13 – 2019-06-20 (×4): 100 mg via ORAL
  Filled 2019-06-09 (×7): qty 1

## 2019-06-09 MED ORDER — ACETAMINOPHEN 650 MG RE SUPP: 650.0000 mg | Freq: Four times a day (QID) | RECTAL | Status: DC | PRN

## 2019-06-09 MED ORDER — GABAPENTIN 100 MG PO CAPS
100.0000 mg | ORAL_CAPSULE | Freq: Every day | ORAL | Status: DC | PRN
  Administered 2019-06-11 – 2019-06-13 (×2): 100 mg via ORAL
  Filled 2019-06-09 (×2): qty 1

## 2019-06-09 MED ORDER — OXYCODONE HCL 5 MG PO TABS
5.0000 mg | ORAL_TABLET | Freq: Four times a day (QID) | ORAL | Status: DC | PRN
  Administered 2019-06-09 – 2019-06-10 (×2): 5 mg via ORAL
  Filled 2019-06-09 (×2): qty 1

## 2019-06-09 MED ORDER — DOCUSATE SODIUM 100 MG PO CAPS: 100.0000 mg | ORAL_CAPSULE | Freq: Two times a day (BID) | ORAL | Status: DC | PRN

## 2019-06-09 NOTE — ED Provider Notes (Signed)
[facility] DEPT Provider Note   CSN: [id number] Arrival date & time: 06/09/19  1445     History   Chief Complaint Chief Complaint  Patient presents with  . Leg Swelling  . Leg Pain    HPI Erika Fry is a 73 y.o. female.  She is complaining of increased swelling of her lower legs with some blister formation and increased redness on them along with pain.  She is also complaining of burning and stinging with urination which she says is chronic for her.  She denies any fever.  No chest pain or shortness of breath.  She said she is being followed by some home health nurses and they said her legs were looking better yesterday.  No trauma.     The history is provided by the patient.  Leg Pain Location:  Leg Leg location:  L lower leg and R lower leg Pain details:    Quality:  Aching   Radiates to:  Does not radiate   Severity:  Moderate   Onset quality:  Gradual   Timing:  Constant   Progression:  Worsening Chronicity:  Recurrent Dislocation: no   Relieved by:  None tried Worsened by:  Nothing Ineffective treatments:  None tried Associated symptoms: stiffness and swelling   Associated symptoms: no back pain, no fever and no neck pain     Past Medical History:  Diagnosis Date  . Arthritis    "knees, thumbs" (03/25/2018)  . CKD (chronic kidney disease), stage IV ([place])   . Diet-controlled diabetes mellitus ([place])   . GERD (gastroesophageal reflux disease)   . Gout    "on daily RX" (03/25/2018)  . Heart murmur   . High cholesterol   . Hypertension   . Hypothyroidism   . Iron deficiency anemia    "had to get an iron infusion"  . Migraine    "used to have them growing up" (03/25/2018)  . Presence of permanent cardiac pacemaker 03/25/2018    Patient Active Problem List   Diagnosis Date Noted  . Bladder pain   . Hyponatremia   . Acute on chronic anemia   . Diabetes mellitus type 2 in obese ([place])   . Chronic kidney disease   . Drug  induced constipation   . Neurogenic bladder   . Debility 04/21/2019  . Acquired hydronephrosis with ureteropelvic junction (UPJ) obstruction   . Pain in joint involving pelvic region and thigh   . Dysuria   . Palliative care by specialist   . AKI (acute kidney injury) ([place]) 04/10/2019  . Hydronephrosis 04/09/2019  . Interstitial cystitis 04/09/2019  . Bilateral leg edema 02/25/2019  . Chronic heart failure with preserved ejection fraction ([place]) 02/24/2019  . Presence of permanent cardiac pacemaker 02/24/2019  . ARF (acute renal failure) ([place]) 02/10/2019  . Peripheral edema   . Fever 01/17/2019  . Acute lower UTI 01/17/2019  . Pressure injury of skin 01/17/2019  . Gross hematuria 01/14/2019  . Fluid retention   . Generalized edema   . Hypokalemia   . Acute right-sided low back pain with right-sided sciatica   . Acute blood loss anemia   . Anemia of chronic disease   . Thrombocytopenia ([place])   . Diabetes mellitus ([place])   . Morbid obesity ([place])   . Benign essential HTN   . Hypoalbuminemia due to protein-calorie malnutrition ([place])   . Occult blood in stools   . Gastric polyp   . Lumbar discitis 05/17/2018  . Blood  loss anemia 05/12/2018  . Hyperlipidemia associated with type 2 diabetes mellitus ([place]) 05/10/2018  . Essential hypertension 04/29/2018  . CKD (chronic kidney disease) stage 4, GFR 15-29 ml/min ([facility])   . Diet-controlled diabetes mellitus ([place])   . GERD (gastroesophageal reflux disease)   . Gout   . High cholesterol   . Hypothyroidism   . Iron deficiency anemia   . AV block, Mobitz 2 03/25/2018  . Mobitz type 2 second degree AV block 03/20/2018  . Aortic stenosis 03/20/2018  . Exertional dyspnea 03/19/2018    Past Surgical History:  Procedure Laterality Date  . APPENDECTOMY    . BIOPSY  05/19/2018   Procedure: BIOPSY;  Surgeon: Ladene Artist, MD;  Location: [facility];  Service: Endoscopy;;  . Consuela Mimes W/ RETROGRADES Bilateral 01/14/2019   Procedure:  CYSTOSCOPY WITH RETROGRADE PYELOGRAM BILATERAL HYDRODISTENTION;  Surgeon: Ardis Hughs, MD;  Location: [facility];  Service: Urology;  Laterality: Bilateral;  . ESOPHAGOGASTRODUODENOSCOPY N/A 05/19/2018   Procedure: ESOPHAGOGASTRODUODENOSCOPY (EGD);  Surgeon: Ladene Artist, MD;  Location: [facility] ENDOSCOPY;  Service: Endoscopy;  Laterality: N/A;  . ESOPHAGOGASTRODUODENOSCOPY (EGD) WITH PROPOFOL N/A 01/28/2019   Procedure: ESOPHAGOGASTRODUODENOSCOPY (EGD) WITH PROPOFOL;  Surgeon: Rush Landmark Telford Nab., MD;  Location: [facility];  Service: Gastroenterology;  Laterality: N/A;  . INSERT / REPLACE / REMOVE PACEMAKER  03/25/2018  . IR FLUORO GUIDE CV LINE RIGHT  05/18/2018  . IR LUMBAR DISC ASPIRATION W/IMG GUIDE  05/15/2018  . IR NEPHROSTOMY EXCHANGE RIGHT  05/28/2019  . IR NEPHROSTOMY PLACEMENT RIGHT  04/09/2019  . IR REMOVAL TUN CV CATH W/O FL  07/23/2018  . IR US GUIDE VASC ACCESS RIGHT  05/18/2018  . KNEE ARTHROSCOPY Bilateral   . PACEMAKER IMPLANT N/A 03/25/2018   Procedure: PACEMAKER IMPLANT;  Surgeon: Evans Lance, MD;  Location: [facility];  Service: Cardiovascular;  Laterality: N/A;  . PACEMAKER PLACEMENT Left 03/2018  . POLYPECTOMY  01/28/2019   Procedure: POLYPECTOMY;  Surgeon: Mansouraty, Telford Nab., MD;  Location: [facility];  Service: Gastroenterology;;  . RIGHT HEART CATH N/A 03/20/2018   Procedure: RIGHT HEART CATH;  Surgeon: Nigel Mormon, MD;  Location: [facility];  Service: Cardiovascular;  Laterality: N/A;  . TEE WITHOUT CARDIOVERSION N/A 03/20/2018   Procedure: TRANSESOPHAGEAL ECHOCARDIOGRAM (TEE);  Surgeon: Nigel Mormon, MD;  Location: [facility] ENDOSCOPY;  Service: Cardiovascular;  Laterality: N/A;  . TONSILLECTOMY    . URETEROSCOPY WITH HOLMIUM LASER LITHOTRIPSY Right 01/14/2019   Procedure: URETEROSCOPY WITH HOLMIUM LASER LITHOTRIPSY BLADDER BIOPSIES RIGHT STENT PLACEMENT;  Surgeon: Ardis Hughs, MD;  Location: [facility];  Service: Urology;   Laterality: Right;     OB History   No obstetric history on file.      Home Medications    Prior to Admission medications   Medication Sig Start Date End Date Taking? Authorizing Provider  acetaminophen (TYLENOL) 325 MG tablet Take 1-2 tablets (325-650 mg total) by mouth every 4 (four) hours as needed for mild pain. 04/30/19  Yes Love, Ivan Anchors, PA-C  Biotin 5 MG CAPS Take 1 capsule (5 mg total) by mouth daily. 01/14/19  Yes Ardis Hughs, MD  cetirizine (ZYRTEC) 10 MG tablet Take 10 mg by mouth at bedtime.    Yes [provider]  ALOE VERA PO Take 3 capsules by mouth 2 (two) times a day.    [provider]  Ascorbic Acid (VITAMIN C PO) Take 1 tablet by mouth 2 (two) times daily.  [provider]  darifenacin (ENABLEX) 15 MG 24 hr tablet Take 1 tablet (15 mg total) by mouth daily. 05/01/19   Love, Ivan Anchors, PA-C  docusate sodium (COLACE) 100 MG capsule Take 1 capsule (100 mg total) by mouth 2 (two) times daily. 04/30/19   Love, Ivan Anchors, PA-C  doxycycline 100 mg in sodium chloride 0.9 % 250 mL Take 100 mg by mouth 2 (two) times a day. 05/06/19   [provider]  ferrous sulfate 325 (65 FE) MG tablet Take 1 tablet (325 mg total) by mouth 2 (two) times daily with a meal. 04/30/19   Love, Ivan Anchors, PA-C  flavoxATE (URISPAS) 100 MG tablet Take 1 tablet (100 mg total) by mouth 3 (three) times daily as needed (bladder spasms, dysuria). 04/30/19   Love, Ivan Anchors, PA-C  fluticasone (FLONASE) 50 MCG/ACT nasal spray Place 2 sprays into both nostrils daily as needed for allergies or rhinitis.    [provider]  folic acid (FOLVITE) 1 MG tablet Take 1 tablet (1 mg total) by mouth daily. 06/22/18   Love, Ivan Anchors, PA-C  furosemide (LASIX) 80 MG tablet Take 1 tablet (80 mg total) by mouth 2 (two) times daily. 05/10/19   Adrian Prows, MD  gabapentin (NEURONTIN) 100 MG capsule Take 1 capsule (100 mg total) by mouth 3 (three) times daily. Patient taking differently:  Take 100 mg by mouth daily.  04/30/19   Love, Ivan Anchors, PA-C  hyoscyamine (LEVSIN) 0.125 MG tablet Take by mouth every 8 (eight) hours as needed. 05/16/19   [provider]  levothyroxine (SYNTHROID) 75 MCG tablet Take 1 tablet (75 mcg total) by mouth daily before breakfast. 04/30/19   Love, Ivan Anchors, PA-C  Menthol-Methyl Salicylate (MUSCLE RUB) 10-15 % CREA Apply 1 application topically every 4 (four) hours as needed for muscle pain. 04/30/19   Love, Ivan Anchors, PA-C  metolazone (ZAROXOLYN) 2.5 MG tablet Take 2.5 mg by mouth daily.    [provider]  Multiple Vitamin (MULTI-VITAMIN PO) Take 1 tablet by mouth daily.    [provider]  oxyCODONE (OXY IR/ROXICODONE) 5 MG immediate release tablet Take 1 tablet (5 mg total) by mouth every 6 (six) hours as needed for severe pain. 04/30/19   Love, Ivan Anchors, PA-C  pantoprazole (PROTONIX) 40 MG tablet Take 1 tablet (40 mg total) by mouth 2 (two) times daily for 30 days. (take once daily after 1 month) 01/29/19 04/09/19  Elodia Florence., MD  polyethylene glycol (MIRALAX / GLYCOLAX) 17 g packet Take 17 g by mouth 2 (two) times daily. 05/01/19   Love, Ivan Anchors, PA-C  potassium chloride SA (K-DUR) 20 MEQ tablet Take 40 mEq by mouth 2 (two) times daily. 06/02/19   [provider]  simvastatin (ZOCOR) 40 MG tablet Take 1 tablet (40 mg total) by mouth at bedtime. 04/21/19   Dessa Phi, DO  sodium bicarbonate 650 MG tablet Take 2 tablets (1,300 mg total) by mouth 2 (two) times daily. 02/19/19   Georgette Shell, MD  torsemide (DEMADEX) 100 MG tablet Take 100 mg by mouth 2 (two) times daily.    [provider]  trimethoprim (TRIMPEX) 100 MG tablet Take 100 mg by mouth every other day. 05/04/19   [provider]  vitamin B-12 (CYANOCOBALAMIN) 1000 MCG tablet Take 1 tablet (1,000 mcg total) by mouth daily. 06/22/18   Bary Leriche, PA-C    Family History Family History  Problem Relation Age of Onset  .  Hypertension Mother   . Diabetes Mellitus II Father   . Heart disease Father   . Heart attack Father   . Gastric cancer Brother   . Diabetes Mellitus II Brother   . Stomach cancer Brother     Social History Social History   Tobacco Use  . Smoking status: Never Smoker  . Smokeless tobacco: Never Used  Substance Use Topics  . Alcohol use: Not Currently  . Drug use: Never     Allergies   Baclofen and Codeine   Review of Systems Review of Systems  Constitutional: Negative for fever.  HENT: Negative for sore throat.   Eyes: Negative for visual disturbance.  Respiratory: Negative for shortness of breath.   Cardiovascular: Negative for chest pain.  Gastrointestinal: Negative for abdominal pain.  Genitourinary: Positive for dysuria and urgency.  Musculoskeletal: Positive for stiffness. Negative for back pain and neck pain.  Skin: Positive for color change and wound. Negative for rash.  Neurological: Negative for headaches.     Physical Exam Updated Vital Signs BP (!) 122/94 (BP Location: Right Arm)   Pulse 94   Temp 98.1 F (36.7 C) (Oral)   Resp 20   SpO2 98%   Physical Exam Vitals signs and nursing note reviewed.  Constitutional:      General: She is not in acute distress.    Appearance: She is well-developed.  HENT:     Head: Normocephalic and atraumatic.  Eyes:     Conjunctiva/sclera: Conjunctivae normal.  Neck:     Musculoskeletal: Neck supple.  Cardiovascular:     Rate and Rhythm: Normal rate and regular rhythm.     Heart sounds: No murmur.  Pulmonary:     Effort: Pulmonary effort is normal. No respiratory distress.     Breath sounds: Normal breath sounds.  Abdominal:     Palpations: Abdomen is soft.     Tenderness: There is no abdominal tenderness.     Comments: She has a right nephrostomy tube that is draining some clear yellow urine.  Musculoskeletal:     Right lower leg: Edema present.     Left lower leg: Edema present.     Comments: She has  market edema of her lower extremities with some large clear filled blisters and some erythema.  Not particularly warm.  Diffusely tender.  Skin:    General: Skin is warm.     Capillary Refill: Capillary refill takes less than 2 seconds.     Findings: Erythema and lesion present.  Neurological:     General: No focal deficit present.     Mental Status: She is alert. Mental status is at baseline.        ED Treatments / Results  Labs (all labs ordered are listed, but only abnormal results are displayed) Labs Reviewed  BASIC METABOLIC PANEL - Abnormal; Notable for the following components:      Result Value   Sodium 133 (*)    CO2 20 (*)    Glucose, Bld 102 (*)    BUN 97 (*)    Creatinine, Ser 4.07 (*)    GFR calc non Af Amer 10 (*)    GFR calc Af Amer 12 (*)    All other components within normal limits  CBC WITH DIFFERENTIAL/PLATELET - Abnormal; Notable for the following components:   RBC 3.36 (*)    Hemoglobin 9.9 (*)    HCT 30.3 (*)    All other components within normal limits  BRAIN NATRIURETIC PEPTIDE - Abnormal;  Notable for the following components:   B Natriuretic Peptide 138.5 (*)    All other components within normal limits  URINALYSIS, ROUTINE W REFLEX MICROSCOPIC - Abnormal; Notable for the following components:   Color, Urine STRAW (*)    APPearance HAZY (*)    Hgb urine dipstick SMALL (*)    Protein, ur 30 (*)    Leukocytes,Ua LARGE (*)    WBC, UA >50 (*)    All other components within normal limits  CULTURE, BLOOD (ROUTINE X 2)  CULTURE, BLOOD (ROUTINE X 2)  URINE CULTURE  SARS CORONAVIRUS 2 (HOSPITAL ORDER, PERFORMED IN [facility])    EKG EKG Interpretation  Date/Time:  Wednesday June 09 2019 17:51:24 EDT Ventricular Rate:  86 PR Interval:    QRS Duration: 136 QT Interval:  396 QTC Calculation: 474 R Axis:   18 Text Interpretation:  Sinus rhythm vs paced Ventricular premature complex Borderline prolonged PR interval Left bundle  branch block Baseline wander in lead(s) I II aVR similar to prior 2/20 Confirmed by Aletta Edouard [phone]) on 06/09/2019 6:02:38 PM   Radiology Vas [place] Lower Extremity Venous (dvt) (only [facility] 7a-7p)  Result Date: 06/09/2019  Lower Venous Study Indications: Swelling, and bilateral lower extremity blisters.  Limitations: Body habitus, poor ultrasound/tissue interface and open wound. Comparison Study: bilateral lower extremity venous duplex 06/07/2018. Performing Technologist: Maudry Mayhew MHA, RDMS, RVT, RDCS  Examination Guidelines: A complete evaluation includes B-mode imaging, spectral Doppler, color Doppler, and power Doppler as needed of all accessible portions of each vessel. Bilateral testing is considered an integral part of a complete examination. Limited examinations for reoccurring indications may be performed as noted.  +---------+---------------+---------+-----------+----------+--------------+ RIGHT    CompressibilityPhasicitySpontaneityPropertiesSummary        +---------+---------------+---------+-----------+----------+--------------+ CFV      Full                                                        +---------+---------------+---------+-----------+----------+--------------+ SFJ      Full                                                        +---------+---------------+---------+-----------+----------+--------------+ FV Prox  Full                                                        +---------+---------------+---------+-----------+----------+--------------+ FV Mid   Full                                                        +---------+---------------+---------+-----------+----------+--------------+ FV DistalFull                                                        +---------+---------------+---------+-----------+----------+--------------+  PFV      Full                                                         +---------+---------------+---------+-----------+----------+--------------+ POP      Full                                                        +---------+---------------+---------+-----------+----------+--------------+ PTV                                                   Not visualized +---------+---------------+---------+-----------+----------+--------------+ PERO                                                  Not visualized +---------+---------------+---------+-----------+----------+--------------+   +---------+---------------+---------+-----------+----------+-------+ LEFT     CompressibilityPhasicitySpontaneityPropertiesSummary +---------+---------------+---------+-----------+----------+-------+ CFV      Full           Yes      Yes                          +---------+---------------+---------+-----------+----------+-------+ SFJ      Full                                                 +---------+---------------+---------+-----------+----------+-------+ FV Prox  Full                                                 +---------+---------------+---------+-----------+----------+-------+ FV Mid   Full                                                 +---------+---------------+---------+-----------+----------+-------+ FV DistalFull                                                 +---------+---------------+---------+-----------+----------+-------+ PFV      Full                                                 +---------+---------------+---------+-----------+----------+-------+ POP      Full           Yes      Yes                          +---------+---------------+---------+-----------+----------+-------+  PTV      Full                                                 +---------+---------------+---------+-----------+----------+-------+ PERO     Full                                                  +---------+---------------+---------+-----------+----------+-------+     Summary: Right: There is no evidence of deep vein thrombosis in the lower extremity. However, portions of this examination were limited- see technologist comments above. No cystic structure found in the popliteal fossa. Left: There is no evidence of deep vein thrombosis in the lower extremity. However, portions of this examination were limited- see technologist comments above. No cystic structure found in the popliteal fossa.  *See table(s) above for measurements and observations.    Preliminary     Procedures Procedures (including critical care time)  Medications Ordered in ED Medications - No data to display   Initial Impression / Assessment and Plan / ED Course  I have reviewed the triage vital signs and the nursing notes.  Pertinent labs & imaging results that were available during my care of the patient were reviewed by me and considered in my medical decision making (see chart for details).  Clinical Course as of Jun 09 2139  Wed Jun 09, 2019  1852 Patient's bilateral lower extremity duplex shows no definite DVT.  Her labs show a slight worsening of her creatinine and her urinalysis shows a possible urine infection although this is been seen before and attributed to her interstitial cystitis.  Urine culture was sent.  Patient does not have a fever or any white count.  She is already on oral diuretics and so likely will need to be admitted to the hospital for IV diuresis and management of her lower extremity edema.   [MB]  1923 Discussed with Dr. Blaine Hamper from the hospitalist service who will evaluate the patient in the emergency department for admission.   [MB]    Clinical Course User Index [MB] Hayden Rasmussen, MD      Marcina Millard Rezek was evaluated in Emergency Department on 06/09/2019 for the symptoms described in the history of present illness. She was evaluated in the context of the global COVID-19 pandemic,  which necessitated consideration that the patient might be at risk for infection with the SARS-CoV-2 virus that causes COVID-19. Institutional protocols and algorithms that pertain to the evaluation of patients at risk for COVID-19 are in a state of rapid change based on information released by regulatory bodies including the CDC and federal and state organizations. These policies and algorithms were followed during the patient's care in the ED.   Final Clinical Impressions(s) / ED Diagnoses   Final diagnoses:  Peripheral edema  Pain in both lower extremities  Chronic kidney disease (CKD), stage IV (severe) [facility])    ED Discharge Orders    None       Hayden Rasmussen, MD 06/09/19 2141

## 2019-06-09 NOTE — Progress Notes (Signed)
Pharmacy Antibiotic Note  Erika Fry is a 73 y.o. female admitted on 06/09/2019 with cellulitis.  Pharmacy has been consulted for vancomycin dosing.  Plan: Vancomycin 1750 mg x1  Scr = 4.07, will check VR to determine when to redose F/u scr/cultures Rocephin 1 Gm IV q24h  Weight: 173 lb (78.5 kg)  Temp (24hrs), Avg:98.1 F (36.7 C), Min:98.1 F (36.7 C), Max:98.1 F (36.7 C)  Recent Labs  Lab 06/09/19 1717  WBC 9.0  CREATININE 4.07*    Estimated Creatinine Clearance: 12.6 mL/min (A) (by C-G formula based on SCr of 4.07 mg/dL (H)).    Allergies  Allergen Reactions  . Baclofen Other (See Comments)    somnolence  . Codeine Other (See Comments)    Increases Pain and couldn't sleep    Antimicrobials this admission: 7/8 rocephin >>  7/8 vancomycin >>   Dose adjustments this admission:   Microbiology results:  BCx:   UCx:    Sputum:    MRSA PCR:   Thank you for allowing pharmacy to be a part of this patient's care.  Dorrene German 06/09/2019 9:21 PM

## 2019-06-09 NOTE — Progress Notes (Signed)
Bilateral lower extremity venous duplex completed.  Refer to "CV Proc" under chart review to view preliminary results.  06/09/2019 6:43 PM Maudry Mayhew, MHA, RVT, RDCS, RDMS

## 2019-06-09 NOTE — ED Notes (Signed)
Only one set of blood culture collected at this time. Dicussed with provider and received permission to hold off on second set at this time.

## 2019-06-09 NOTE — H&P (Signed)
History and Physical    Erika Fry IRS:[id number] DOB: 1946-09-21 DOA: 06/09/2019  Referring MD/NP/PA:   PCP: [name], MD   Patient coming from:  The patient is coming from home.  At baseline, pt is independent for most of ADL.        Chief Complaint: Bilateral leg edema  HPI: Erika Fry is a 73 y.o. female with medical history significant of hypertension, hyperlipidemia, diet-controlled diabetes, GERD, hypothyroidism, gout, pacemaker placement, iron deficiency anemia, CKD-4, CHF, who presents with bilateral leg edema.  Patient states that he has worsening bilateral leg edema in the past several days, which has been progressively worsening. She also has blisters in the legs and pain in both legs.  No injury.  Patient denies fever or chills.  Patient does not have chest pain, shortness breath, cough.  Denies nausea, vomiting, diarrhea or abdominal pain.  Patient states that she has chronic dysuria. She also has chronic urinary urgency and frequency which has worsened today since she took lasix in AM. Of note, patient was hospitalized from 5/8-5/20 due to worsening renal function and right hydronephrosis due to UPJ obstruction. Patient had nephrostomy tube treatment in that admission.  ED Course: pt was found to have BNP 138, positive urinalysis (hazy appearance, large amount of leukocyte, WBC 50, bacteria not seen), stable hemoglobin, stable renal function, temperature normal, blood pressure 141/77, heart rate 89, RR 23, oxygen saturation 92% on room air.  Lower extremity venous Dopplers negative for DVT.  Review of Systems:   General: no fevers, chills, no body weight gain, has fatigue HEENT: no blurry vision, hearing changes or sore throat Respiratory: no dyspnea, coughing, wheezing CV: no chest pain, no palpitations GI: no nausea, vomiting, abdominal pain, diarrhea, constipation GU: has dysuria, burning on urination, increased urinary frequency, no hematuria  Ext:  has leg edema Neuro: no unilateral weakness, numbness, or tingling, no vision change or hearing loss Skin: no rash, no skin tear. MSK: No muscle spasm, no deformity, no limitation of range of movement in spin Heme: No easy bruising.  Travel history: No recent long distant travel.  Allergy:  Allergies  Allergen Reactions  . Baclofen Other (See Comments)    somnolence  . Codeine Other (See Comments)    Increases Pain and couldn't sleep    Past Medical History:  Diagnosis Date  . Arthritis    "knees, thumbs" (03/25/2018)  . CKD (chronic kidney disease), stage IV ([place])   . Diet-controlled diabetes mellitus ([place])   . GERD (gastroesophageal reflux disease)   . Gout    "on daily RX" (03/25/2018)  . Heart murmur   . High cholesterol   . Hypertension   . Hypothyroidism   . Iron deficiency anemia    "had to get an iron infusion"  . Migraine    "used to have them growing up" (03/25/2018)  . Presence of permanent cardiac pacemaker 03/25/2018    Past Surgical History:  Procedure Laterality Date  . APPENDECTOMY    . BIOPSY  05/19/2018   Procedure: BIOPSY;  Surgeon: Ladene Artist, MD;  Location: [place];  Service: Endoscopy;;  . Consuela Mimes W/ RETROGRADES Bilateral 01/14/2019   Procedure: CYSTOSCOPY WITH RETROGRADE PYELOGRAM BILATERAL HYDRODISTENTION;  Surgeon: Ardis Hughs, MD;  Location: [facility];  Service: Urology;  Laterality: Bilateral;  . ESOPHAGOGASTRODUODENOSCOPY N/A 05/19/2018   Procedure: ESOPHAGOGASTRODUODENOSCOPY (EGD);  Surgeon: Ladene Artist, MD;  Location: [facility];  Service: Endoscopy;  Laterality: N/A;  . ESOPHAGOGASTRODUODENOSCOPY (EGD) WITH PROPOFOL N/A  01/28/2019   Procedure: ESOPHAGOGASTRODUODENOSCOPY (EGD) WITH PROPOFOL;  Surgeon: Rush Landmark Telford Nab., MD;  Location: [place];  Service: Gastroenterology;  Laterality: N/A;  . INSERT / REPLACE / REMOVE PACEMAKER  03/25/2018  . IR FLUORO GUIDE CV LINE RIGHT  05/18/2018  . IR LUMBAR DISC  ASPIRATION W/IMG GUIDE  05/15/2018  . IR NEPHROSTOMY EXCHANGE RIGHT  05/28/2019  . IR NEPHROSTOMY PLACEMENT RIGHT  04/09/2019  . IR REMOVAL TUN CV CATH W/O FL  07/23/2018  . IR US GUIDE VASC ACCESS RIGHT  05/18/2018  . KNEE ARTHROSCOPY Bilateral   . PACEMAKER IMPLANT N/A 03/25/2018   Procedure: PACEMAKER IMPLANT;  Surgeon: Evans Lance, MD;  Location: [place] [facility];  Service: Cardiovascular;  Laterality: N/A;  . PACEMAKER PLACEMENT Left 03/2018  . POLYPECTOMY  01/28/2019   Procedure: POLYPECTOMY;  Surgeon: Mansouraty, Telford Nab., MD;  Location: [place];  Service: Gastroenterology;;  . RIGHT HEART CATH N/A 03/20/2018   Procedure: RIGHT HEART CATH;  Surgeon: Nigel Mormon, MD;  Location: [place] [facility];  Service: Cardiovascular;  Laterality: N/A;  . TEE WITHOUT CARDIOVERSION N/A 03/20/2018   Procedure: TRANSESOPHAGEAL ECHOCARDIOGRAM (TEE);  Surgeon: Nigel Mormon, MD;  Location: [facility] ENDOSCOPY;  Service: Cardiovascular;  Laterality: N/A;  . TONSILLECTOMY    . URETEROSCOPY WITH HOLMIUM LASER LITHOTRIPSY Right 01/14/2019   Procedure: URETEROSCOPY WITH HOLMIUM LASER LITHOTRIPSY BLADDER BIOPSIES RIGHT STENT PLACEMENT;  Surgeon: Ardis Hughs, MD;  Location: [facility];  Service: Urology;  Laterality: Right;    Social History:  reports that she has never smoked. She has never used smokeless tobacco. She reports previous alcohol use. She reports that she does not use drugs.  Family History:  Family History  Problem Relation Age of Onset  . Hypertension Mother   . Diabetes Mellitus II Father   . Heart disease Father   . Heart attack Father   . Gastric cancer Brother   . Diabetes Mellitus II Brother   . Stomach cancer Brother      Prior to Admission medications   Medication Sig Start Date End Date Taking? Authorizing Provider  acetaminophen (TYLENOL) 325 MG tablet Take 1-2 tablets (325-650 mg total) by mouth every 4 (four) hours as needed for mild pain. 04/30/19  Yes  Love, Ivan Anchors, PA-C  Biotin 5 MG CAPS Take 1 capsule (5 mg total) by mouth daily. 01/14/19  Yes Ardis Hughs, MD  cetirizine (ZYRTEC) 10 MG tablet Take 10 mg by mouth at bedtime.    Yes [provider]  ALOE VERA PO Take 3 capsules by mouth 2 (two) times a day.    [provider]  Ascorbic Acid (VITAMIN C PO) Take 1 tablet by mouth 2 (two) times daily.    [provider]  darifenacin (ENABLEX) 15 MG 24 hr tablet Take 1 tablet (15 mg total) by mouth daily. 05/01/19   Love, Ivan Anchors, PA-C  docusate sodium (COLACE) 100 MG capsule Take 1 capsule (100 mg total) by mouth 2 (two) times daily. 04/30/19   Love, Ivan Anchors, PA-C  doxycycline 100 mg in sodium chloride 0.9 % 250 mL Take 100 mg by mouth 2 (two) times a day. 05/06/19   [provider]  ferrous sulfate 325 (65 FE) MG tablet Take 1 tablet (325 mg total) by mouth 2 (two) times daily with a meal. 04/30/19   Love, Ivan Anchors, PA-C  flavoxATE (URISPAS) 100 MG tablet Take 1 tablet (100 mg total) by mouth 3 (three) times  daily as needed (bladder spasms, dysuria). 04/30/19   Love, Ivan Anchors, PA-C  fluticasone (FLONASE) 50 MCG/ACT nasal spray Place 2 sprays into both nostrils daily as needed for allergies or rhinitis.    [provider]  folic acid (FOLVITE) 1 MG tablet Take 1 tablet (1 mg total) by mouth daily. 06/22/18   Love, Ivan Anchors, PA-C  furosemide (LASIX) 80 MG tablet Take 1 tablet (80 mg total) by mouth 2 (two) times daily. 05/10/19   Adrian Prows, MD  gabapentin (NEURONTIN) 100 MG capsule Take 1 capsule (100 mg total) by mouth 3 (three) times daily. Patient taking differently: Take 100 mg by mouth daily.  04/30/19   Love, Ivan Anchors, PA-C  hyoscyamine (LEVSIN) 0.125 MG tablet Take by mouth every 8 (eight) hours as needed. 05/16/19   [provider]  levothyroxine (SYNTHROID) 75 MCG tablet Take 1 tablet (75 mcg total) by mouth daily before breakfast. 04/30/19   Love, Ivan Anchors, PA-C  Menthol-Methyl Salicylate  (MUSCLE RUB) 10-15 % CREA Apply 1 application topically every 4 (four) hours as needed for muscle pain. 04/30/19   Love, Ivan Anchors, PA-C  metolazone (ZAROXOLYN) 2.5 MG tablet Take 2.5 mg by mouth daily.    [provider]  Multiple Vitamin (MULTI-VITAMIN PO) Take 1 tablet by mouth daily.    [provider]  oxyCODONE (OXY IR/ROXICODONE) 5 MG immediate release tablet Take 1 tablet (5 mg total) by mouth every 6 (six) hours as needed for severe pain. 04/30/19   Love, Ivan Anchors, PA-C  pantoprazole (PROTONIX) 40 MG tablet Take 1 tablet (40 mg total) by mouth 2 (two) times daily for 30 days. (take once daily after 1 month) 01/29/19 04/09/19  Elodia Florence., MD  polyethylene glycol (MIRALAX / GLYCOLAX) 17 g packet Take 17 g by mouth 2 (two) times daily. 05/01/19   Love, Ivan Anchors, PA-C  potassium chloride SA (K-DUR) 20 MEQ tablet Take 40 mEq by mouth 2 (two) times daily. 06/02/19   [provider]  simvastatin (ZOCOR) 40 MG tablet Take 1 tablet (40 mg total) by mouth at bedtime. 04/21/19   Dessa Phi, DO  sodium bicarbonate 650 MG tablet Take 2 tablets (1,300 mg total) by mouth 2 (two) times daily. 02/19/19   Georgette Shell, MD  torsemide (DEMADEX) 100 MG tablet Take 100 mg by mouth 2 (two) times daily.    [provider]  trimethoprim (TRIMPEX) 100 MG tablet Take 100 mg by mouth every other day. 05/04/19   [provider]  vitamin B-12 (CYANOCOBALAMIN) 1000 MCG tablet Take 1 tablet (1,000 mcg total) by mouth daily. 06/22/18   Bary Leriche, PA-C    Physical Exam: Vitals:   06/09/19 1511 06/09/19 1749  BP: (!) 122/94 (!) 141/77  Pulse: 94 89  Resp: 20 (!) 23  Temp: 98.1 F (36.7 C)   TempSrc: Oral   SpO2: 98% 99%   General: Not in acute distress HEENT:       Eyes: PERRL, EOMI, no scleral icterus.       ENT: No discharge from the ears and nose, no pharynx injection, no tonsillar enlargement.        Neck: No JVD, no bruit, no mass felt. Heme: No  neck lymph node enlargement. Cardiac: S1/S2, RRR, No gallops or rubs. Respiratory: No rales, wheezing, rhonchi or rubs. GI: Soft, nondistended, nontender, no rebound pain, no organomegaly, BS present. GU: No hematuria Ext: 3+ pitting leg edema bilaterally, with oozing and blisters. Has  erythema, warmth and tenderness in both lower legs. 2+DP/PT pulse bilaterally. Musculoskeletal: No joint deformities, No joint redness or warmth, no limitation of ROM in spin. Skin: No rashes.  Neuro: Alert, oriented X3, cranial nerves II-XII grossly intact, moves all extremities normally.  Psych: Patient is not psychotic, no suicidal or hemocidal ideation.  Labs on Admission: I have personally reviewed following labs and imaging studies  CBC: Recent Labs  Lab 06/07/19 1327 06/09/19 1717  WBC  --  9.0  NEUTROABS  --  6.2  HGB 9.9* 9.9*  HCT  --  30.3*  MCV  --  90.2  PLT  --  335   Basic Metabolic Panel: Recent Labs  Lab 06/09/19 1717  NA 133*  K 4.1  CL 98  CO2 20*  GLUCOSE 102*  BUN 97*  CREATININE 4.07*  CALCIUM 9.8   GFR: CrCl cannot be calculated (Unknown ideal weight.). Liver Function Tests: No results for input(s): AST, ALT, ALKPHOS, BILITOT, PROT, ALBUMIN in the last 168 hours. No results for input(s): LIPASE, AMYLASE in the last 168 hours. No results for input(s): AMMONIA in the last 168 hours. Coagulation Profile: No results for input(s): INR, PROTIME in the last 168 hours. Cardiac Enzymes: No results for input(s): CKTOTAL, CKMB, CKMBINDEX, TROPONINI in the last 168 hours. BNP (last 3 results) No results for input(s): PROBNP in the last 8760 hours. HbA1C: No results for input(s): HGBA1C in the last 72 hours. CBG: No results for input(s): GLUCAP in the last 168 hours. Lipid Profile: No results for input(s): CHOL, HDL, LDLCALC, TRIG, CHOLHDL, LDLDIRECT in the last 72 hours. Thyroid Function Tests: No results for input(s): TSH, T4TOTAL, FREET4, T3FREE, THYROIDAB in the  last 72 hours. Anemia Panel: No results for input(s): VITAMINB12, FOLATE, FERRITIN, TIBC, IRON, RETICCTPCT in the last 72 hours. Urine analysis:    Component Value Date/Time   COLORURINE STRAW (A) 06/09/2019 1717   APPEARANCEUR HAZY (A) 06/09/2019 1717   LABSPEC 1.009 06/09/2019 1717   PHURINE 7.0 06/09/2019 1717   GLUCOSEU NEGATIVE 06/09/2019 1717   HGBUR SMALL (A) 06/09/2019 1717   BILIRUBINUR NEGATIVE 06/09/2019 1717   KETONESUR NEGATIVE 06/09/2019 1717   PROTEINUR 30 (A) 06/09/2019 1717   NITRITE NEGATIVE 06/09/2019 1717   LEUKOCYTESUR LARGE (A) 06/09/2019 1717   Sepsis Labs: _0 (procalcitonin:4,lacticidven:4) ) Recent Results (from the past 240 hour(s))  SARS Coronavirus 2 (CEPHEID - Performed in [place] hospital lab), Hosp Order     Status: None   Collection Time: 06/09/19  5:17 PM   Specimen: Nasopharyngeal Swab  Result Value Ref Range Status   SARS Coronavirus 2 NEGATIVE NEGATIVE Final    Comment: (NOTE) If result is NEGATIVE SARS-CoV-2 target nucleic acids are NOT DETECTED. The SARS-CoV-2 RNA is generally detectable in upper and lower  respiratory specimens during the acute phase of infection. The lowest  concentration of SARS-CoV-2 viral copies this assay can detect is 250  copies / mL. A negative result does not preclude SARS-CoV-2 infection  and should not be used as the sole basis for treatment or other  patient management decisions.  A negative result may occur with  improper specimen collection / handling, submission of specimen other  than nasopharyngeal swab, presence of viral mutation(s) within the  areas targeted by this assay, and inadequate number of viral copies  (<250 copies / mL). A negative result must be combined with clinical  observations, patient history, and epidemiological information. If result is POSITIVE SARS-CoV-2 target nucleic acids are DETECTED. The SARS-CoV-2  RNA is generally detectable in upper and lower  respiratory  specimens dur ing the acute phase of infection.  Positive  results are indicative of active infection with SARS-CoV-2.  Clinical  correlation with patient history and other diagnostic information is  necessary to determine patient infection status.  Positive results do  not rule out bacterial infection or co-infection with other viruses. If result is PRESUMPTIVE POSTIVE SARS-CoV-2 nucleic acids MAY BE PRESENT.   A presumptive positive result was obtained on the submitted specimen  and confirmed on repeat testing.  While 2019 novel coronavirus  (SARS-CoV-2) nucleic acids may be present in the submitted sample  additional confirmatory testing may be necessary for epidemiological  and / or clinical management purposes  to differentiate between  SARS-CoV-2 and other Sarbecovirus currently known to infect humans.  If clinically indicated additional testing with an alternate test  methodology [phone]) is advised. The SARS-CoV-2 RNA is generally  detectable in upper and lower respiratory sp ecimens during the acute  phase of infection. The expected result is Negative. Fact Sheet for Patients:  StrictlyIdeas.no Fact Sheet for Healthcare Providers: BankingDealers.co.za This test is not yet approved or cleared by the [place] FDA and has been authorized for detection and/or diagnosis of SARS-CoV-2 by FDA under an Emergency Use Authorization (EUA).  This EUA will remain in effect (meaning this test can be used) for the duration of the COVID-19 declaration under Section 564(b)(1) of the Act, 21 U.S.C. section 360bbb-3(b)(1), unless the authorization is terminated or revoked sooner. Performed at [facility], [place] [address] [place], [place] [address]      Radiological Exams on Admission: Vas [place] Lower Extremity Venous (dvt) (only [facility] & Wl 7a-7p)  Result Date: 06/09/2019  Lower Venous Study Indications: Swelling, and  bilateral lower extremity blisters.  Limitations: Body habitus, poor ultrasound/tissue interface and open wound. Comparison Study: bilateral lower extremity venous duplex 06/07/2018. Performing Technologist: Maudry Mayhew MHA, RDMS, RVT, RDCS  Examination Guidelines: A complete evaluation includes B-mode imaging, spectral Doppler, color Doppler, and power Doppler as needed of all accessible portions of each vessel. Bilateral testing is considered an integral part of a complete examination. Limited examinations for reoccurring indications may be performed as noted.  +---------+---------------+---------+-----------+----------+--------------+ RIGHT    CompressibilityPhasicitySpontaneityPropertiesSummary        +---------+---------------+---------+-----------+----------+--------------+ CFV      Full                                                        +---------+---------------+---------+-----------+----------+--------------+ SFJ      Full                                                        +---------+---------------+---------+-----------+----------+--------------+ FV Prox  Full                                                        +---------+---------------+---------+-----------+----------+--------------+ FV Mid   Full                                                        +---------+---------------+---------+-----------+----------+--------------+  FV DistalFull                                                        +---------+---------------+---------+-----------+----------+--------------+ PFV      Full                                                        +---------+---------------+---------+-----------+----------+--------------+ POP      Full                                                        +---------+---------------+---------+-----------+----------+--------------+ PTV                                                   Not visualized  +---------+---------------+---------+-----------+----------+--------------+ PERO                                                  Not visualized +---------+---------------+---------+-----------+----------+--------------+   +---------+---------------+---------+-----------+----------+-------+ LEFT     CompressibilityPhasicitySpontaneityPropertiesSummary +---------+---------------+---------+-----------+----------+-------+ CFV      Full           Yes      Yes                          +---------+---------------+---------+-----------+----------+-------+ SFJ      Full                                                 +---------+---------------+---------+-----------+----------+-------+ FV Prox  Full                                                 +---------+---------------+---------+-----------+----------+-------+ FV Mid   Full                                                 +---------+---------------+---------+-----------+----------+-------+ FV DistalFull                                                 +---------+---------------+---------+-----------+----------+-------+ PFV      Full                                                 +---------+---------------+---------+-----------+----------+-------+  POP      Full           Yes      Yes                          +---------+---------------+---------+-----------+----------+-------+ PTV      Full                                                 +---------+---------------+---------+-----------+----------+-------+ PERO     Full                                                 +---------+---------------+---------+-----------+----------+-------+     Summary: Right: There is no evidence of deep vein thrombosis in the lower extremity. However, portions of this examination were limited- see technologist comments above. No cystic structure found in the popliteal fossa. Left: There is no evidence of deep vein  thrombosis in the lower extremity. However, portions of this examination were limited- see technologist comments above. No cystic structure found in the popliteal fossa.  *See table(s) above for measurements and observations.    Preliminary      EKG: Independently reviewed.  Seems to be paced rhythm, QTC 474  Assessment/Plan Principal Problem:   Bilateral leg edema Active Problems:   CKD (chronic kidney disease) stage 4, GFR 15-29 ml/min (HCC)   Diet-controlled diabetes mellitus (HCC)   GERD (gastroesophageal reflux disease)   High cholesterol   Hypothyroidism   Iron deficiency anemia   Essential hypertension   UTI (urinary tract infection)   Chronic diastolic CHF (congestive heart failure) (HCC)   Cellulitis of lower extremity   Bilateral leg edema: Lower extremity venous Dopplers negative for DVT.  Differential diagnosis include fluid retention secondary to [facility] and CKD-IV and cellulitis.  Patient does not have fever or chills, but she has erythema, warmth and tenderness in both lower legs, indicating possible cellulitis of lower etremity.  -Will place on tele bed for obs -start Lasix 80 bid by IV -check ESR and CRP -check procalcitonine and lactic acid level -start Vanco and rocephin IV -f/u Bx   CKD (chronic kidney disease) stage 4, GFR 15-29 ml/min ([place]): close to baseline.  Baseline creatinine 3.5-4.5 recently.  Her creatinine is a 4.07, BUN 97. -f/u By BMP  Diet-controlled diabetes mellitus ([place]): Last A1c , poorly fairly well controled. Patient is taking at home -check CBG qAM  GERD (gastroesophageal reflux disease): -Protonix  High cholesterol: -zocor  Hypothyroidism: -Synthroid  Iron deficiency anemia: Hgb stable. 9.9 on 06/07/19-->9.9 -Continue iron supplement  Essential hypertension: -on IV lasix -prn hydralazine  Possible UTI: -on  vanco and rocephine -f/u urine culture  Chronic diastolic CHF (congestive heart failure) ([place]): 2D echo on 02/16/2019  showed EF of 55-60%.  Patient has bilateral leg edema which is likely partially from [facility], but patient does not have shortness of breath, chest pain.  BNP 138.  Does not seem to have acute CHF exacerbation. -Patient is IV Lasix as above   DVT ppx: SQ Heparin Code Status: Full code Family Communication: None at bed side.   Disposition Plan:  Anticipate discharge back to previous home environment Consults called:  none Admission status: Obs /  tele     Date of Service 06/09/2019    Ivor Costa [facility]   If 7PM-7AM, please contact night-coverage [email or web address] Password [email or web address] 06/09/2019, 9:12 PM
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
## 2019-06-09 NOTE — ED Triage Notes (Signed)
Per EMS, pt from home complains of edema and cellulitis in both legs. Pt also has dysuria that is being evaluated by nephrologist..   BP 106/58 HR 86 O2 99% CBG 177

## 2019-06-10 ENCOUNTER — Ambulatory Visit: Payer: Medicare Other | Admitting: Cardiology

## 2019-06-10 ENCOUNTER — Other Ambulatory Visit: Payer: Self-pay

## 2019-06-10 DIAGNOSIS — L03119 Cellulitis of unspecified part of limb: Secondary | ICD-10-CM | POA: Diagnosis not present

## 2019-06-10 DIAGNOSIS — I132 Hypertensive heart and chronic kidney disease with heart failure and with stage 5 chronic kidney disease, or end stage renal disease: Secondary | ICD-10-CM | POA: Diagnosis present

## 2019-06-10 DIAGNOSIS — D509 Iron deficiency anemia, unspecified: Secondary | ICD-10-CM | POA: Diagnosis present

## 2019-06-10 DIAGNOSIS — L03115 Cellulitis of right lower limb: Secondary | ICD-10-CM | POA: Diagnosis present

## 2019-06-10 DIAGNOSIS — N3090 Cystitis, unspecified without hematuria: Secondary | ICD-10-CM | POA: Diagnosis not present

## 2019-06-10 DIAGNOSIS — Z20828 Contact with and (suspected) exposure to other viral communicable diseases: Secondary | ICD-10-CM | POA: Diagnosis present

## 2019-06-10 DIAGNOSIS — T2105XA Burn of unspecified degree of buttock, initial encounter: Secondary | ICD-10-CM | POA: Diagnosis present

## 2019-06-10 DIAGNOSIS — K219 Gastro-esophageal reflux disease without esophagitis: Secondary | ICD-10-CM | POA: Diagnosis present

## 2019-06-10 DIAGNOSIS — I5033 Acute on chronic diastolic (congestive) heart failure: Secondary | ICD-10-CM | POA: Diagnosis present

## 2019-06-10 DIAGNOSIS — N186 End stage renal disease: Secondary | ICD-10-CM | POA: Diagnosis present

## 2019-06-10 DIAGNOSIS — M79604 Pain in right leg: Secondary | ICD-10-CM | POA: Diagnosis not present

## 2019-06-10 DIAGNOSIS — N390 Urinary tract infection, site not specified: Secondary | ICD-10-CM | POA: Diagnosis present

## 2019-06-10 DIAGNOSIS — Z01810 Encounter for preprocedural cardiovascular examination: Secondary | ICD-10-CM | POA: Diagnosis not present

## 2019-06-10 DIAGNOSIS — K5900 Constipation, unspecified: Secondary | ICD-10-CM | POA: Diagnosis not present

## 2019-06-10 DIAGNOSIS — R238 Other skin changes: Secondary | ICD-10-CM | POA: Diagnosis present

## 2019-06-10 DIAGNOSIS — L03116 Cellulitis of left lower limb: Secondary | ICD-10-CM | POA: Diagnosis present

## 2019-06-10 DIAGNOSIS — E039 Hypothyroidism, unspecified: Secondary | ICD-10-CM | POA: Diagnosis present

## 2019-06-10 DIAGNOSIS — E785 Hyperlipidemia, unspecified: Secondary | ICD-10-CM | POA: Diagnosis present

## 2019-06-10 DIAGNOSIS — Z7189 Other specified counseling: Secondary | ICD-10-CM | POA: Diagnosis not present

## 2019-06-10 DIAGNOSIS — Z950 Presence of cardiac pacemaker: Secondary | ICD-10-CM | POA: Diagnosis not present

## 2019-06-10 DIAGNOSIS — R601 Generalized edema: Secondary | ICD-10-CM | POA: Diagnosis not present

## 2019-06-10 DIAGNOSIS — R600 Localized edema: Secondary | ICD-10-CM | POA: Diagnosis not present

## 2019-06-10 DIAGNOSIS — Z992 Dependence on renal dialysis: Secondary | ICD-10-CM | POA: Diagnosis not present

## 2019-06-10 DIAGNOSIS — Z515 Encounter for palliative care: Secondary | ICD-10-CM | POA: Diagnosis present

## 2019-06-10 DIAGNOSIS — N184 Chronic kidney disease, stage 4 (severe): Secondary | ICD-10-CM | POA: Diagnosis not present

## 2019-06-10 DIAGNOSIS — Z66 Do not resuscitate: Secondary | ICD-10-CM | POA: Diagnosis present

## 2019-06-10 DIAGNOSIS — X16XXXA Contact with hot heating appliances, radiators and pipes, initial encounter: Secondary | ICD-10-CM | POA: Diagnosis present

## 2019-06-10 DIAGNOSIS — N179 Acute kidney failure, unspecified: Secondary | ICD-10-CM | POA: Diagnosis present

## 2019-06-10 DIAGNOSIS — N3010 Interstitial cystitis (chronic) without hematuria: Secondary | ICD-10-CM | POA: Diagnosis present

## 2019-06-10 DIAGNOSIS — E875 Hyperkalemia: Secondary | ICD-10-CM | POA: Diagnosis not present

## 2019-06-10 DIAGNOSIS — D631 Anemia in chronic kidney disease: Secondary | ICD-10-CM | POA: Diagnosis present

## 2019-06-10 DIAGNOSIS — I5032 Chronic diastolic (congestive) heart failure: Secondary | ICD-10-CM | POA: Diagnosis not present

## 2019-06-10 DIAGNOSIS — E871 Hypo-osmolality and hyponatremia: Secondary | ICD-10-CM | POA: Diagnosis present

## 2019-06-10 DIAGNOSIS — E1122 Type 2 diabetes mellitus with diabetic chronic kidney disease: Secondary | ICD-10-CM | POA: Diagnosis present

## 2019-06-10 DIAGNOSIS — R531 Weakness: Secondary | ICD-10-CM | POA: Diagnosis not present

## 2019-06-10 LAB — GLUCOSE, CAPILLARY
Glucose-Capillary: 91 mg/dL (ref 70–99)
Glucose-Capillary: 117 mg/dL — ABNORMAL HIGH (ref 70–99)
Glucose-Capillary: 133 mg/dL — ABNORMAL HIGH (ref 70–99)

## 2019-06-10 LAB — URINE CULTURE

## 2019-06-10 MED ORDER — OXYCODONE HCL 5 MG PO TABS
2.5000 mg | ORAL_TABLET | Freq: Four times a day (QID) | ORAL | Status: DC | PRN
  Administered 2019-06-10 – 2019-06-14 (×15): 5 mg via ORAL
  Filled 2019-06-10 (×15): qty 1

## 2019-06-10 MED ORDER — GERHARDT'S BUTT CREAM
TOPICAL_CREAM | Freq: Every day | CUTANEOUS | Status: AC
  Administered 2019-06-10 – 2019-06-23 (×10): via TOPICAL
  Filled 2019-06-10: qty 1
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
## 2019-06-10 NOTE — Progress Notes (Signed)
PROGRESS NOTE    Erika Fry  YQI:[id number]  DOB: 1946-03-13  DOA: 06/09/2019 PCP: [name], MD   Brief Admission Hx: 73 y.o. female with medical history significant of hypertension, hyperlipidemia, diet-controlled diabetes, GERD, hypothyroidism, gout, pacemaker placement, iron deficiency anemia, CKD-4, CHF, who presents with bilateral leg edema.  MDM/Assessment & Plan:   1. Recurrent bilateral leg edema - LE dopplers neg for DVT.  Suspect acute on chronic fluid retention from diastolic CHF.  Continue IV diuresis and elevation of extremities.  2. Cellulitis LEs - mild - Continue IV ceftriaxone.  Follow clinically.  3. Wounds BLEs - bullous lesions and scaling of skin - consult to [facility] nurse.  4. DM type 2 - diet controlled.  5. GERD - continue protonix for GI protection.  6. Hyperlipidemia - continue simvastatin.  7. Stage 4 CKD - creatinine stable, follow closely with IV diuresis.  8. UTI - continue IV ceftriaxone.  Follow culture.  9. Hypothyroidism - continue levothyroxine.  10. Chronic diastolic CHF with mild exacerbation - continue IV diuresis.   11. Diabetic neuropathy painful - allodynia of both feet, treat symptoms.   DVT prophylaxis: heparin SQ Code Status: Full  Family Communication: patient updated at bedside Disposition Plan: inpatient for IV diuresis    Consultants:    Procedures:    Antimicrobials:  Ceftriaxone 7/9 >   Subjective: Pt report that she has painful lower extremities.    Objective: Vitals:   06/09/19 2127 06/10/19 0519 06/10/19 0521 06/10/19 0819  BP: (!) 131/91  (!) 149/86 (!) 152/67  Pulse: 74  75 69  Resp: 18  16 16   Temp: 98.3 F (36.8 C)  97.8 F (36.6 C) 98.6 F (37 C)  TempSrc: Oral  Oral Oral  SpO2: 100%  100% 98%  Weight:  69.4 kg    Height:  5\' 5"  (1.651 m)      Intake/Output Summary (Last 24 hours) at 06/10/2019 0955 Last data filed at 06/10/2019 0732 Gross per 24 hour  Intake 1080 ml  Output 1750 ml   Net -670 ml   Filed Weights   06/09/19 2118 06/10/19 0519  Weight: 78.5 kg 69.4 kg     REVIEW OF SYSTEMS  As per history otherwise all reviewed and reported negative  Exam:  General exam: awake, alert, NAD, cooperative.  Respiratory system: Clear. No increased work of breathing. Cardiovascular system: S1 & S2 heard. No JVD, murmurs, gallops, clicks or pedal edema. Gastrointestinal system: Abdomen is nondistended, soft and nontender. Normal bowel sounds heard. Central nervous system: Alert and oriented. No focal neurological deficits. Extremities: diffuse pitting edema bilateral LEs, allodynia bilateral feet.  Data Reviewed: Basic Metabolic Panel: Recent Labs  Lab 06/09/19 1717 06/09/19 2209  NA 133*  --   K 4.1  --   CL 98  --   CO2 20*  --   GLUCOSE 102*  --   BUN 97*  --   CREATININE 4.07* 4.01*  CALCIUM 9.8  --    Liver Function Tests: No results for input(s): AST, ALT, ALKPHOS, BILITOT, PROT, ALBUMIN in the last 168 hours. No results for input(s): LIPASE, AMYLASE in the last 168 hours. No results for input(s): AMMONIA in the last 168 hours. CBC: Recent Labs  Lab 06/07/19 1327 06/09/19 1717  WBC  --  9.0  NEUTROABS  --  6.2  HGB 9.9* 9.9*  HCT  --  30.3*  MCV  --  90.2  PLT  --  239   Cardiac Enzymes:  No results for input(s): CKTOTAL, CKMB, CKMBINDEX, TROPONINI in the last 168 hours. CBG (last 3)  Recent Labs    06/10/19 0817  GLUCAP 91   Recent Results (from the past 240 hour(s))  SARS Coronavirus 2 (CEPHEID - Performed in [place] hospital lab), Hosp Order     Status: None   Collection Time: 06/09/19  5:17 PM   Specimen: Nasopharyngeal Swab  Result Value Ref Range Status   SARS Coronavirus 2 NEGATIVE NEGATIVE Final    Comment: (NOTE) If result is NEGATIVE SARS-CoV-2 target nucleic acids are NOT DETECTED. The SARS-CoV-2 RNA is generally detectable in upper and lower  respiratory specimens during the acute phase of infection. The lowest   concentration of SARS-CoV-2 viral copies this assay can detect is 250  copies / mL. A negative result does not preclude SARS-CoV-2 infection  and should not be used as the sole basis for treatment or other  patient management decisions.  A negative result may occur with  improper specimen collection / handling, submission of specimen other  than nasopharyngeal swab, presence of viral mutation(s) within the  areas targeted by this assay, and inadequate number of viral copies  (<250 copies / mL). A negative result must be combined with clinical  observations, patient history, and epidemiological information. If result is POSITIVE SARS-CoV-2 target nucleic acids are DETECTED. The SARS-CoV-2 RNA is generally detectable in upper and lower  respiratory specimens dur ing the acute phase of infection.  Positive  results are indicative of active infection with SARS-CoV-2.  Clinical  correlation with patient history and other diagnostic information is  necessary to determine patient infection status.  Positive results do  not rule out bacterial infection or co-infection with other viruses. If result is PRESUMPTIVE POSTIVE SARS-CoV-2 nucleic acids MAY BE PRESENT.   A presumptive positive result was obtained on the submitted specimen  and confirmed on repeat testing.  While 2019 novel coronavirus  (SARS-CoV-2) nucleic acids may be present in the submitted sample  additional confirmatory testing may be necessary for epidemiological  and / or clinical management purposes  to differentiate between  SARS-CoV-2 and other Sarbecovirus currently known to infect humans.  If clinically indicated additional testing with an alternate test  methodology [phone]) is advised. The SARS-CoV-2 RNA is generally  detectable in upper and lower respiratory sp ecimens during the acute  phase of infection. The expected result is Negative. Fact Sheet for Patients:  StrictlyIdeas.no Fact Sheet  for Healthcare Providers: BankingDealers.co.za This test is not yet approved or cleared by the [place] FDA and has been authorized for detection and/or diagnosis of SARS-CoV-2 by FDA under an Emergency Use Authorization (EUA).  This EUA will remain in effect (meaning this test can be used) for the duration of the COVID-19 declaration under Section 564(b)(1) of the Act, 21 U.S.C. section 360bbb-3(b)(1), unless the authorization is terminated or revoked sooner. Performed at [facility], [place] [address] [place], [place] [address]      Studies: Vas [place] Lower Extremity Venous (dvt) (only Mc & Wl 7a-7p)  Result Date: 06/09/2019  Lower Venous Study Indications: Swelling, and bilateral lower extremity blisters.  Limitations: Body habitus, poor ultrasound/tissue interface and open wound. Comparison Study: bilateral lower extremity venous duplex 06/07/2018. Performing Technologist: Maudry Mayhew MHA, RDMS, RVT, RDCS  Examination Guidelines: A complete evaluation includes B-mode imaging, spectral Doppler, color Doppler, and power Doppler as needed of all accessible portions of each vessel. Bilateral testing is considered an integral part of a  complete examination. Limited examinations for reoccurring indications may be performed as noted.  +---------+---------------+---------+-----------+----------+--------------+ RIGHT    CompressibilityPhasicitySpontaneityPropertiesSummary        +---------+---------------+---------+-----------+----------+--------------+ CFV      Full                                                        +---------+---------------+---------+-----------+----------+--------------+ SFJ      Full                                                        +---------+---------------+---------+-----------+----------+--------------+ FV Prox  Full                                                         +---------+---------------+---------+-----------+----------+--------------+ FV Mid   Full                                                        +---------+---------------+---------+-----------+----------+--------------+ FV DistalFull                                                        +---------+---------------+---------+-----------+----------+--------------+ PFV      Full                                                        +---------+---------------+---------+-----------+----------+--------------+ POP      Full                                                        +---------+---------------+---------+-----------+----------+--------------+ PTV                                                   Not visualized +---------+---------------+---------+-----------+----------+--------------+ PERO                                                  Not visualized +---------+---------------+---------+-----------+----------+--------------+   +---------+---------------+---------+-----------+----------+-------+ LEFT     CompressibilityPhasicitySpontaneityPropertiesSummary +---------+---------------+---------+-----------+----------+-------+ CFV      Full           Yes      Yes                          +---------+---------------+---------+-----------+----------+-------+  SFJ      Full                                                 +---------+---------------+---------+-----------+----------+-------+ FV Prox  Full                                                 +---------+---------------+---------+-----------+----------+-------+ FV Mid   Full                                                 +---------+---------------+---------+-----------+----------+-------+ FV DistalFull                                                 +---------+---------------+---------+-----------+----------+-------+ PFV      Full                                                  +---------+---------------+---------+-----------+----------+-------+ POP      Full           Yes      Yes                          +---------+---------------+---------+-----------+----------+-------+ PTV      Full                                                 +---------+---------------+---------+-----------+----------+-------+ PERO     Full                                                 +---------+---------------+---------+-----------+----------+-------+     Summary: Right: There is no evidence of deep vein thrombosis in the lower extremity. However, portions of this examination were limited- see technologist comments above. No cystic structure found in the popliteal fossa. Left: There is no evidence of deep vein thrombosis in the lower extremity. However, portions of this examination were limited- see technologist comments above. No cystic structure found in the popliteal fossa.  *See table(s) above for measurements and observations.    Preliminary    Scheduled Meds: . ferrous sulfate  325 mg Oral BID WC  . furosemide  80 mg Intravenous BID  . heparin  5,000 Units Subcutaneous Q8H  . levothyroxine  75 mcg Oral QAC breakfast  . loratadine  10 mg Oral Daily  . multivitamin with minerals  1 tablet Oral Daily  . pantoprazole  40 mg Oral BID  . simvastatin  40 mg Oral QHS   Continuous Infusions: . cefTRIAXone (ROCEPHIN)  IV 1 g (06/09/19 2219)  Principal Problem:   Bilateral leg edema Active Problems:   CKD (chronic kidney disease) stage 4, GFR 15-29 ml/min (HCC)   Diet-controlled diabetes mellitus (HCC)   GERD (gastroesophageal reflux disease)   High cholesterol   Hypothyroidism   Iron deficiency anemia   Essential hypertension   UTI (urinary tract infection)   Chronic diastolic CHF (congestive heart failure) (HCC)   Cellulitis of lower extremity   Acute on chronic diastolic CHF (congestive heart failure) ([place])  Time spent:   Irwin Brakeman, MD [facility] 06/10/2019, 9:55 AM    LOS: 0 days  How to contact the [facility] Attending or Consulting provider [place] or covering provider during after hours [place], for this patient?  1. Check the care team in [facility] and look for a) attending/consulting [facility] provider listed and b) the [facility] team listed 2. Log into [email or web address] and use [place] universal password to access. If you do not have the password, please contact the hospital operator. 3. Locate the [facility] provider you are looking for under [facility] and page to a number that you can be directly reached. 4. If you still have difficulty reaching the provider, please page the [facility] (Director on Call) for the Hospitalists listed on amion for assistance.

## 2019-06-10 NOTE — Evaluation (Addendum)
Physical Therapy Evaluation Patient Details Name: Erika Fry MRN: [id number] DOB: 15-Dec-1945 Today's Date: 06/10/2019   History of Present Illness  73 y.o. female with medical history significant of hypertension, hyperlipidemia, diet-controlled diabetes, GERD, hypothyroidism, gout, pacemaker placement, iron deficiency anemia, CKD-4, CHF, peripheral neuropathy, and recent R nephrostomy drain placed 04/2019 who presents with bilateral leg edema.  Clinical Impression  Pt admitted with above diagnosis. Pt currently with functional limitations due to the deficits listed below (see PT Problem List).  Pt will benefit from skilled PT to increase their independence and safety with mobility to allow discharge to the venue listed below.  Pt reports she lives alone however family is constantly with her.  Pt also states she has all needed DME at home.  Pt plans to return home upon d/c.  Recommend HHPT and 24/7 supervision for safety.  Pt able to stand at bedside today with RW however increased LE pain limiting mobility.  Pt reports feeling generalized weakness.         Follow Up Recommendations Home health PT;Supervision for mobility/OOB    Equipment Recommendations  None recommended by PT    Recommendations for Other Services       Precautions / Restrictions Precautions Precautions: Fall Precaution Comments: large fluid filled blister on bil LEs, LEs very tender to touch      Mobility  Bed Mobility Overal bed mobility: Needs Assistance Bed Mobility: Supine to Sit;Sit to Supine     Supine to sit: Min assist Sit to supine: Mod assist   General bed mobility comments: assist for LEs, edematous and and tender due to neuropathy and fluid filled blisters on lower legs  Transfers Overall transfer level: Needs assistance Equipment used: Rolling walker (2 wheeled) Transfers: Sit to/from Stand Sit to Stand: Min assist         General transfer comment: verbal cues for self assist, assist  to rise and steady; pt able to take a couple steps up [facility] however LEs too painful for ambulating today  Ambulation/Gait                Stairs            Wheelchair Mobility    Modified Rankin (Stroke Patients Only)       Balance Overall balance assessment: Needs assistance         Standing balance support: Bilateral upper extremity supported Standing balance-Leahy Scale: Poor                               Pertinent Vitals/Pain Pain Assessment: Faces Faces Pain Scale: Hurts whole lot Pain Location: urinating, and bil LEs with dependent positioning Pain Descriptors / Indicators: Burning Pain Intervention(s): Monitored during session;Repositioned    Home Living Family/patient expects to be discharged to:: Private residence Living Arrangements: Alone Available Help at Discharge: Family;Available 24 hours/day Type of Home: House Home Access: Stairs to enter Entrance Stairs-Rails: Right Entrance Stairs-Number of Steps: 3 Home Layout: Laundry or work area in basement;Able to live on main level with bedroom/bathroom Home Equipment: Environmental consultant - 2 wheels;Cane - single point;Wheelchair - [employer];Shower seat      Prior Function Level of Independence: Independent with assistive device(s)         Comments: uses RW at baseline     Hand Dominance        Extremity/Trunk Assessment        Lower Extremity Assessment Lower Extremity Assessment: Generalized weakness;RLE  deficits/detail;LLE deficits/detail RLE Deficits / Details: large fluid filled blisters on LEs - pt reports LEs are going to be wrapped RLE Sensation: history of peripheral neuropathy LLE Sensation: history of peripheral neuropathy       Communication   Communication: No difficulties  Cognition Arousal/Alertness: Awake/alert Behavior During Therapy: WFL for tasks assessed/performed Overall Cognitive Status: Within Functional Limits for tasks assessed                                         General Comments      Exercises     Assessment/Plan    PT Assessment Patient needs continued PT services  PT Problem List Decreased strength;Decreased mobility;Decreased activity tolerance;Decreased balance;Decreased knowledge of use of DME       PT Treatment Interventions Gait training;DME instruction;Therapeutic activities;Therapeutic exercise;Patient/family education;Functional mobility training;Balance training    PT Goals (Current goals can be found in the Care Plan section)  Acute Rehab PT Goals PT Goal Formulation: With patient Time For Goal Achievement: 06/24/19 Potential to Achieve Goals: Good    Frequency Min 3X/week   Barriers to discharge        Co-evaluation               AM-PAC PT "6 Clicks" Mobility  Outcome Measure Help needed turning from your back to your side while in a flat bed without using bedrails?: A Little Help needed moving from lying on your back to sitting on the side of a flat bed without using bedrails?: A Lot Help needed moving to and from a bed to a chair (including a wheelchair)?: A Little Help needed standing up from a chair using your arms (e.g., wheelchair or bedside chair)?: A Little Help needed to walk in hospital room?: A Lot Help needed climbing 3-5 steps with a railing? : A Lot 6 Click Score: 15    End of Session   Activity Tolerance: Patient limited by pain Patient left: in bed;with bed alarm set;with call bell/phone within reach Nurse Communication: Mobility status PT Visit Diagnosis: Muscle weakness (generalized) (M62.81);Difficulty in walking, not elsewhere classified (R26.2)    Time: 1342-1401 PT Time Calculation (min) (ACUTE ONLY): 19 min   Charges:   PT Evaluation $PT Eval Low Complexity: [place], PT, DPT Acute Rehabilitation Services Office: [phone] Pager: [phone]  Trena Platt 06/10/2019, 2:57 PM

## 2019-06-11 DIAGNOSIS — Z7189 Other specified counseling: Secondary | ICD-10-CM

## 2019-06-11 DIAGNOSIS — N3090 Cystitis, unspecified without hematuria: Secondary | ICD-10-CM

## 2019-06-11 LAB — CBC WITH DIFFERENTIAL/PLATELET
Abs Immature Granulocytes: 0.02 10*3/uL (ref 0.00–0.07)
Basophils Absolute: 0.1 10*3/uL (ref 0.0–0.1)
Basophils Relative: 1 %
Eosinophils Absolute: 0.2 10*3/uL (ref 0.0–0.5)
Eosinophils Relative: 2 %
HCT: 29 % — ABNORMAL LOW (ref 36.0–46.0)
Hemoglobin: 9.5 g/dL — ABNORMAL LOW (ref 12.0–15.0)
Immature Granulocytes: 0 %
Lymphocytes Relative: 21 %
Lymphs Abs: 1.4 10*3/uL (ref 0.7–4.0)
MCH: 30.3 pg (ref 26.0–34.0)
MCHC: 32.8 g/dL (ref 30.0–36.0)
MCV: 92.4 fL (ref 80.0–100.0)
Monocytes Absolute: 0.5 10*3/uL (ref 0.1–1.0)
Monocytes Relative: 8 %
Neutro Abs: 4.3 10*3/uL (ref 1.7–7.7)
Neutrophils Relative %: 68 %
Platelets: 216 10*3/uL (ref 150–400)
RBC: 3.14 MIL/uL — ABNORMAL LOW (ref 3.87–5.11)
RDW: 13.8 % (ref 11.5–15.5)
WBC: 6.4 10*3/uL (ref 4.0–10.5)
nRBC: 0 % (ref 0.0–0.2)

## 2019-06-11 LAB — MAGNESIUM: Magnesium: 1.4 mg/dL — ABNORMAL LOW (ref 1.7–2.4)

## 2019-06-11 LAB — RENAL FUNCTION PANEL
Albumin: 3.1 g/dL — ABNORMAL LOW (ref 3.5–5.0)
Anion gap: 14 (ref 5–15)
BUN: 79 mg/dL — ABNORMAL HIGH (ref 8–23)
CO2: 21 mmol/L — ABNORMAL LOW (ref 22–32)
Calcium: 9.3 mg/dL (ref 8.9–10.3)
Chloride: 100 mmol/L (ref 98–111)
Creatinine, Ser: 3.63 mg/dL — ABNORMAL HIGH (ref 0.44–1.00)
GFR calc Af Amer: 13 mL/min — ABNORMAL LOW (ref >60)
GFR calc non Af Amer: 12 mL/min — ABNORMAL LOW (ref >60)
Glucose, Bld: 99 mg/dL (ref 70–99)
Phosphorus: 4.1 mg/dL (ref 2.5–4.6)
Potassium: 3.7 mmol/L (ref 3.5–5.1)
Sodium: 135 mmol/L (ref 135–145)

## 2019-06-11 MED ORDER — MAGNESIUM SULFATE 2 GM/50ML IV SOLN
2.0000 g | Freq: Once | INTRAVENOUS | Status: AC
  Administered 2019-06-11: 2 g via INTRAVENOUS
  Filled 2019-06-11: qty 50

## 2019-06-11 MED ORDER — SODIUM CHLORIDE 0.9 % IV SOLN: INTRAVENOUS | Status: DC | PRN

## 2019-06-11 MED ORDER — POLYETHYLENE GLYCOL 3350 17 G PO PACK
17.0000 g | PACK | Freq: Every day | ORAL | Status: DC
  Administered 2019-06-11 – 2019-06-12 (×2): 17 g via ORAL
  Filled 2019-06-11 (×2): qty 1

## 2019-06-11 NOTE — Consult Note (Signed)
Consultation Note Date: 06/11/2019   Patient Name: Erika Fry  DOB: 1946/11/10  MRN: [id number]  Age / Sex: 73 y.o., female  PCP: [name], MD Referring Physician: Murlean Iba, MD  Reason for Consultation: Establishing goals of care and Psychosocial/spiritual support  HPI/Patient Profile: 73 y.o. female  admitted on 06/09/2019  with past medical history significant of hypertension, hyperlipidemia, diet-controlled diabetes, GERD, hypothyroidism, gout, pacemaker placement, iron deficiency anemia, CKD-4, CHF, who presents with bilateral leg edema.  This is the patient's sixth admission in the last 6 months.  Patient also had a short rehab at [facility]  Patient states that he has worsening bilateral leg edema in the past several days, which has been progressively worsening.  He had a recent fall. She also has blisters in the legs and pain in both legs.  No injury.  She also has chronic urinary urgency and frequency, under the care of urology for interstitial cystitis.   Of note, patient was hospitalized from 5/8-5/20 due to worsening renal function and right hydronephrosis due to UPJ obstruction. Patient had nephrostomy tube treatment in that admission.  Per family patient has had  continued physical and functional decline over the past year.  In spite of strong family support and strong community health care she has continued to fail to thrive.  Patient and family face treatment option decisions, advanced directive decisions and anticipatory care needs.  Clinical Assessment and Goals of Care:  This NP Wadie Lessen reviewed medical records, received report from team, assessed the patient and then meet at the patient's bedside and by telephone with Levan Hurst  to discuss diagnosis, prognosis, GOC, EOL wishes disposition and options.  Concept of  Palliative Care were discussed  A discussion  was had today regarding advanced directives.  Concepts specific to code status, artifical feeding and hydration, continued IV antibiotics and rehospitalization was had.  The difference between a aggressive medical intervention path  and a palliative comfort care path for this patient at this time was had.  Values and goals of care important to patient and family were attempted to be elicited.  Discussed with family the importance of patient and family discussion regarding her current medical situation and the difficult decisions she faces specifically to advanced directive decisions,  future  initiation of dialysis and likely need for further assistance at home and decreasing independence.   Questions and concerns addressed.   Family encouraged to call with questions or concerns.    PMT will continue to support holistically.    HPOA/patient tells me her 3 sons are documented healthcare powers of attorney.  Encouraged family to bring copy of advanced directives and H POA  into the hospital for scanning  SUMMARY OF RECOMMENDATIONS    Code Status/Advance Care Planning:  Full code   Symptom Management:   Patient describes extremely painful urination which intensifies at the back end of stream.  Interstitial cystitis-as previously prescribed by urology- 03-2019 -Lidocaine 5% ointment-apply daily to painful area and vagina -Estradiol(Estrace) 0.01% ( 0.1mg /gram) vaginal  cream    Additional Recommendations (Limitations, Scope, Preferences):  Patient is open to all offered and available medical interventions to prolong life.  Patient has verbalized her agreement with initiation of dialysis when recommended.  Psycho-social/Spiritual:   Desire for further Chaplaincy support:no   Prognosis:   Unable to determine  Discharge Planning: Family are of the understanding that the patient needs more help at home.  They will have discussion together and with the patient herself regarding  anticipatory care needs.  They plan to look into out-of-pocket caregivers augmenting the care at home.  Her family is supportive and lives nearby but they all work.   To Be Determined      Primary Diagnoses: Present on Admission: . CKD (chronic kidney disease) stage 4, GFR 15-29 ml/min ([place]) . GERD (gastroesophageal reflux disease) . High cholesterol . Hypothyroidism . Iron deficiency anemia . Essential hypertension . Chronic diastolic CHF (congestive heart failure) ([place]) . Bilateral leg edema . UTI (urinary tract infection) . Cellulitis of lower extremity . Acute on chronic diastolic CHF (congestive heart failure) ([place])   I have reviewed the medical record, interviewed the patient and family, and examined the patient. The following aspects are pertinent.  Past Medical History:  Diagnosis Date  . Arthritis    "knees, thumbs" (03/25/2018)  . CKD (chronic kidney disease), stage IV ([place])   . Diet-controlled diabetes mellitus ([place])   . GERD (gastroesophageal reflux disease)   . Gout    "on daily RX" (03/25/2018)  . Heart murmur   . High cholesterol   . Hypertension   . Hypothyroidism   . Iron deficiency anemia    "had to get an iron infusion"  . Migraine    "used to have them growing up" (03/25/2018)  . Presence of permanent cardiac pacemaker 03/25/2018   Social History   Socioeconomic History  . Marital status: Widowed    Spouse name: Not on file  . Number of children: 3  . Years of education: Not on file  . Highest education level: Not on file  Occupational History  . Not on file  Social Needs  . Financial resource strain: Not on file  . Food insecurity    Worry: Not on file    Inability: Not on file  . Transportation needs    Medical: Not on file    Non-medical: Not on file  Tobacco Use  . Smoking status: Never Smoker  . Smokeless tobacco: Never Used  Substance and Sexual Activity  . Alcohol use: Not Currently  . Drug use: Never  . Sexual activity: Not  Currently  Lifestyle  . Physical activity    Days per week: Not on file    Minutes per session: Not on file  . Stress: Not on file  Relationships  . Social Herbalist on phone: Not on file    Gets together: Not on file    Attends religious service: Not on file    Active member of club or organization: Not on file    Attends meetings of clubs or organizations: Not on file    Relationship status: Not on file  Other Topics Concern  . Not on file  Social History Narrative  . Not on file   Family History  Problem Relation Age of Onset  . Hypertension Mother   . Diabetes Mellitus II Father   . Heart disease Father   . Heart attack Father   . Gastric cancer Brother   . Diabetes  Mellitus II Brother   . Stomach cancer Brother    Scheduled Meds: . ferrous sulfate  325 mg Oral BID WC  . furosemide  80 mg Intravenous BID  . Gerhardt's butt cream   Topical Daily  . heparin  5,000 Units Subcutaneous Q8H  . levothyroxine  75 mcg Oral QAC breakfast  . loratadine  10 mg Oral Daily  . multivitamin with minerals  1 tablet Oral Daily  . pantoprazole  40 mg Oral BID  . polyethylene glycol  17 g Oral QHS  . simvastatin  40 mg Oral QHS   Continuous Infusions: . sodium chloride    . cefTRIAXone (ROCEPHIN)  IV 1 g (06/10/19 2128)   PRN Meds:.sodium chloride, acetaminophen **OR** acetaminophen, docusate sodium, flavoxATE, fluticasone, gabapentin, hydrALAZINE, ondansetron **OR** ondansetron (ZOFRAN) IV, oxyCODONE Medications Prior to Admission:  Prior to Admission medications   Medication Sig Start Date End Date Taking? Authorizing Provider  acetaminophen (TYLENOL) 325 MG tablet Take 1-2 tablets (325-650 mg total) by mouth every 4 (four) hours as needed for mild pain. 04/30/19  Yes Love, Ivan Anchors, PA-C  cetirizine (ZYRTEC) 10 MG tablet Take 10 mg by mouth at bedtime.    Yes [provider]  ferrous sulfate 325 (65 FE) MG tablet Take 1 tablet (325 mg total) by mouth 2 (two)  times daily with a meal. 04/30/19  Yes Love, Ivan Anchors, PA-C  flavoxATE (URISPAS) 100 MG tablet Take 1 tablet (100 mg total) by mouth 3 (three) times daily as needed (bladder spasms, dysuria). 04/30/19  Yes Love, Ivan Anchors, PA-C  furosemide (LASIX) 80 MG tablet Take 1 tablet (80 mg total) by mouth 2 (two) times daily. 05/10/19  Yes Adrian Prows, MD  levothyroxine (SYNTHROID) 75 MCG tablet Take 1 tablet (75 mcg total) by mouth daily before breakfast. 04/30/19  Yes Love, Ivan Anchors, PA-C  Multiple Vitamin (MULTI-VITAMIN PO) Take 1 tablet by mouth daily.   Yes [provider]  oxyCODONE (OXY IR/ROXICODONE) 5 MG immediate release tablet Take 1 tablet (5 mg total) by mouth every 6 (six) hours as needed for severe pain. 04/30/19  Yes Love, Ivan Anchors, PA-C  pantoprazole (PROTONIX) 40 MG tablet Take 40 mg by mouth 2 (two) times daily.   Yes [provider]  potassium chloride SA (K-DUR) 20 MEQ tablet Take 40 mEq by mouth 2 (two) times daily. 06/02/19  Yes [provider]  simvastatin (ZOCOR) 40 MG tablet Take 1 tablet (40 mg total) by mouth at bedtime. 04/21/19  Yes Dessa Phi, DO  Biotin 5 MG CAPS Take 1 capsule (5 mg total) by mouth daily. Patient not taking: Reported on 06/09/2019 01/14/19   Ardis Hughs, MD  darifenacin (ENABLEX) 15 MG 24 hr tablet Take 1 tablet (15 mg total) by mouth daily. Patient not taking: Reported on 06/09/2019 05/01/19   Love, Ivan Anchors, PA-C  docusate sodium (COLACE) 100 MG capsule Take 1 capsule (100 mg total) by mouth 2 (two) times daily. Patient taking differently: Take 100 mg by mouth 2 (two) times daily as needed for mild constipation.  04/30/19   Love, Ivan Anchors, PA-C  fluticasone (FLONASE) 50 MCG/ACT nasal spray Place 2 sprays into both nostrils daily as needed for allergies or rhinitis.    [provider]  folic acid (FOLVITE) 1 MG tablet Take 1 tablet (1 mg total) by mouth daily. Patient not taking: Reported on 06/09/2019 06/22/18   Love, Ivan Anchors,  PA-C  gabapentin (NEURONTIN) 100 MG capsule Take 1 capsule (  100 mg total) by mouth 3 (three) times daily. Patient taking differently: Take 100 mg by mouth daily as needed (pain).  04/30/19   Love, Ivan Anchors, PA-C  Menthol-Methyl Salicylate (MUSCLE RUB) 10-15 % CREA Apply 1 application topically every 4 (four) hours as needed for muscle pain. Patient not taking: Reported on 06/09/2019 04/30/19   Love, Ivan Anchors, PA-C  pantoprazole (PROTONIX) 40 MG tablet Take 1 tablet (40 mg total) by mouth 2 (two) times daily for 30 days. (take once daily after 1 month) 01/29/19 04/09/19  Elodia Florence., MD  polyethylene glycol (MIRALAX / GLYCOLAX) 17 g packet Take 17 g by mouth 2 (two) times daily. Patient taking differently: Take 17 g by mouth daily as needed for mild constipation.  05/01/19   Love, Ivan Anchors, PA-C  sodium bicarbonate 650 MG tablet Take 2 tablets (1,300 mg total) by mouth 2 (two) times daily. Patient not taking: Reported on 06/09/2019 02/19/19   Georgette Shell, MD  vitamin B-12 (CYANOCOBALAMIN) 1000 MCG tablet Take 1 tablet (1,000 mcg total) by mouth daily. Patient not taking: Reported on 06/09/2019 06/22/18   Bary Leriche, PA-C   Allergies  Allergen Reactions  . Baclofen Other (See Comments)    somnolence  . Codeine Other (See Comments)    Increases Pain and couldn't sleep   Review of Systems  Cardiovascular: Positive for leg swelling.  Genitourinary: Positive for difficulty urinating, dysuria and urgency.  Neurological: Positive for weakness.    Physical Exam Constitutional:      Appearance: She is normal weight. She is ill-appearing.  Cardiovascular:     Rate and Rhythm: Normal rate and regular rhythm.     Heart sounds: Normal heart sounds.  Pulmonary:     Effort: Pulmonary effort is normal.     Breath sounds: Normal breath sounds.  Musculoskeletal:     Right lower leg: Edema present.     Left lower leg: Edema present.  Skin:    General: Skin is warm and dry.     Comments:  -Noted bilateral lower extremity skin changes per EMR  Neurological:     Mental Status: She is alert and oriented to person, place, and time.     Vital Signs: BP 120/66 (BP Location: Left Arm)   Pulse 76   Temp 98.8 F (37.1 C) (Oral)   Resp 14   Ht 5\' 5"  (1.651 m)   Wt 68.4 kg   SpO2 98%   BMI 25.09 kg/m  Pain Scale: 0-10 POSS *See Group Information*: 1-Acceptable,Awake and alert Pain Score: 10-Worst pain ever   SpO2: SpO2: 98 % O2 Device:SpO2: 98 % O2 Flow Rate: .   IO: Intake/output summary:   Intake/Output Summary (Last 24 hours) at 06/11/2019 1426 Last data filed at 06/11/2019 1231 Gross per 24 hour  Intake 190 ml  Output 3725 ml  Net -3535 ml    LBM: Last BM Date: (pta) Baseline Weight: Weight: 78.5 kg Most recent weight: Weight: 68.4 kg     Palliative Assessment/Data:   Discussed with Dr Wynetta Emery  Time In: 1445 Time Out: 1600 Time Total: 75 minutes Greater than 50%  of this time was spent counseling and coordinating care related to the above assessment and plan.  Signed by: Wadie Lessen, NP   Please contact Palliative Medicine Team phone at [phone] for questions and concerns.  For individual provider: See Shea Evans

## 2019-06-11 NOTE — Progress Notes (Signed)
PROGRESS NOTE    Erika Fry  YBW:[id number]  DOB: [birth year]  DOA: 06/09/2019 PCP: [name], MD   Brief Admission Hx: 73 y.o. female with medical history significant of hypertension, hyperlipidemia, diet-controlled diabetes, GERD, hypothyroidism, gout, pacemaker placement, iron deficiency anemia, CKD-4, CHF, who presents with bilateral leg edema.  MDM/Assessment & Plan:   1. Recurrent bilateral leg edema - LE dopplers neg for DVT.  Suspect acute on chronic fluid retention from diastolic CHF.  Continue IV diuresis and elevation of extremities.  She is clinically improving with IV diuresis.  2. Cellulitis LEs - mild - Continue IV ceftriaxone.  Follow clinically.  3. Wounds BLEs - bullous lesions and scaling of skin - consult to [facility] nurse.  Legs are wrapped now.   4. DM type 2 - diet controlled.  5. GERD - continue protonix for GI protection.  6. Hyperlipidemia - continue simvastatin.  7. Stage 4 CKD - creatinine stable, follow closely with IV diuresis.  8. UTI - continue IV ceftriaxone.  Urine culture no significant growth.  9. Hypothyroidism - continue levothyroxine.  10. Chronic diastolic CHF with mild exacerbation - continue IV diuresis.   11. Diabetic neuropathy painful - allodynia of both feet, treat symptoms.  Pt and family requesting palliative consult for end of life pain management, goals of care and home hospice.    DVT prophylaxis: heparin SQ Code Status: Full  Family Communication: patient updated at bedside Disposition Plan: continue inpatient for IV diuresis    Consultants:  Palliative medicine  Procedures:    Antimicrobials:  Ceftriaxone 7/9 >   Subjective: Pt reports persistent painful lower extremities but edema is improving and she is urinating frequently    Objective: Vitals:   06/10/19 1611 06/10/19 2117 06/11/19 0500 06/11/19 0608  BP: (!) 149/72 118/71  112/71  Pulse: 76 75  72  Resp: 16 16  14   Temp: 97.9 F (36.6 C) 98.3 F  (36.8 C)  98.3 F (36.8 C)  TempSrc: Oral Oral  Oral  SpO2: 99% 98%  100%  Weight:   68.4 kg   Height:        Intake/Output Summary (Last 24 hours) at 06/11/2019 1050 Last data filed at 06/11/2019 0745 Gross per 24 hour  Intake 140 ml  Output 4350 ml  Net -4210 ml   Filed Weights   06/09/19 2118 06/10/19 0519 06/11/19 0500  Weight: 78.5 kg 69.4 kg 68.4 kg   REVIEW OF SYSTEMS  As per history otherwise all reviewed and reported negative  Exam:  General exam: awake, alert, NAD, cooperative.  Respiratory system: Clear. No increased work of breathing. Cardiovascular system: S1 & S2 heard. No JVD, murmurs, gallops, clicks or pedal edema. Gastrointestinal system: Abdomen is nondistended, soft and nontender. Normal bowel sounds heard. Central nervous system: Alert and oriented. No focal neurological deficits. Extremities: improving edema bilateral LEs, allodynia bilateral feet unchanged.  Data Reviewed: Basic Metabolic Panel: Recent Labs  Lab 06/09/19 1717 06/09/19 2209 06/11/19 0742  NA 133*  --  135  K 4.1  --  3.7  CL 98  --  100  CO2 20*  --  21*  GLUCOSE 102*  --  99  BUN 97*  --  79*  CREATININE 4.07* 4.01* 3.63*  CALCIUM 9.8  --  9.3  MG  --   --  1.4*  PHOS  --   --  4.1   Liver Function Tests: Recent Labs  Lab 06/11/19 0742  ALBUMIN 3.1*   No  results for input(s): LIPASE, AMYLASE in the last 168 hours. No results for input(s): AMMONIA in the last 168 hours. CBC: Recent Labs  Lab 06/07/19 1327 06/09/19 1717 06/11/19 0742  WBC  --  9.0 6.4  NEUTROABS  --  6.2 4.3  HGB 9.9* 9.9* 9.5*  HCT  --  30.3* 29.0*  MCV  --  90.2 92.4  PLT  --  239 216   Cardiac Enzymes: No results for input(s): CKTOTAL, CKMB, CKMBINDEX, TROPONINI in the last 168 hours. CBG (last 3)  Recent Labs    06/10/19 0817 06/10/19 1232 06/10/19 1609  GLUCAP 91 117* 133*   Recent Results (from the past 240 hour(s))  Urine culture     Status: Abnormal   Collection Time:  06/09/19  5:17 PM   Specimen: Urine, Clean Catch  Result Value Ref Range Status   Specimen Description   Final    URINE, CLEAN CATCH Performed at [facility], [place] [address], [place], [place] [address]    Special Requests   Final    NONE Performed at [facility], [place] [address], [place], [place] [address]    Culture MULTIPLE SPECIES PRESENT, SUGGEST RECOLLECTION (A)  Final   Report Status 06/10/2019 FINAL  Final  SARS Coronavirus 2 (CEPHEID - Performed in [place] hospital lab), Hosp Order     Status: None   Collection Time: 06/09/19  5:17 PM   Specimen: Nasopharyngeal Swab  Result Value Ref Range Status   SARS Coronavirus 2 NEGATIVE NEGATIVE Final    Comment: (NOTE) If result is NEGATIVE SARS-CoV-2 target nucleic acids are NOT DETECTED. The SARS-CoV-2 RNA is generally detectable in upper and lower  respiratory specimens during the acute phase of infection. The lowest  concentration of SARS-CoV-2 viral copies this assay can detect is 250  copies / mL. A negative result does not preclude SARS-CoV-2 infection  and should not be used as the sole basis for treatment or other  patient management decisions.  A negative result may occur with  improper specimen collection / handling, submission of specimen other  than nasopharyngeal swab, presence of viral mutation(s) within the  areas targeted by this assay, and inadequate number of viral copies  (<250 copies / mL). A negative result must be combined with clinical  observations, patient history, and epidemiological information. If result is POSITIVE SARS-CoV-2 target nucleic acids are DETECTED. The SARS-CoV-2 RNA is generally detectable in upper and lower  respiratory specimens dur ing the acute phase of infection.  Positive  results are indicative of active infection with SARS-CoV-2.  Clinical  correlation with patient history and other diagnostic information is  necessary to determine  patient infection status.  Positive results do  not rule out bacterial infection or co-infection with other viruses. If result is PRESUMPTIVE POSTIVE SARS-CoV-2 nucleic acids MAY BE PRESENT.   A presumptive positive result was obtained on the submitted specimen  and confirmed on repeat testing.  While 2019 novel coronavirus  (SARS-CoV-2) nucleic acids may be present in the submitted sample  additional confirmatory testing may be necessary for epidemiological  and / or clinical management purposes  to differentiate between  SARS-CoV-2 and other Sarbecovirus currently known to infect humans.  If clinically indicated additional testing with an alternate test  methodology [id number]) is advised. The SARS-CoV-2 RNA is generally  detectable in upper and lower respiratory sp ecimens during the acute  phase of infection. The expected result is Negative. Fact Sheet for Patients:  StrictlyIdeas.no Fact Sheet for Healthcare Providers: BankingDealers.co.za This test is not yet approved or cleared by the [place] FDA and has been authorized for detection and/or diagnosis of SARS-CoV-2 by FDA under an Emergency Use Authorization (EUA).  This EUA will remain in effect (meaning this test can be used) for the duration of the COVID-19 declaration under Section 564(b)(1) of the Act, 21 U.S.C. section 360bbb-3(b)(1), unless the authorization is terminated or revoked sooner. Performed at [facility], [place] [address], [place], [place] [address]   Culture, blood (routine x 2)     Status: None (Preliminary result)   Collection Time: 06/09/19  5:41 PM   Specimen: BLOOD  Result Value Ref Range Status   Specimen Description   Final    BLOOD RIGHT ANTECUBITAL Performed at [facility] [address] [place], [place] [address]    Special Requests   Final    BOTTLES DRAWN AEROBIC AND ANAEROBIC Blood Culture adequate  volume Performed at [facility] [address], [place], [place] [address]    Culture   Final    NO GROWTH < 24 HOURS Performed at [facility] [address], [place], [place] [address]    Report Status PENDING  Incomplete  Culture, blood (routine x 2)     Status: None (Preliminary result)   Collection Time: 06/09/19  9:58 PM   Specimen: BLOOD  Result Value Ref Range Status   Specimen Description   Final    BLOOD BLOOD LEFT HAND Performed at [facility] [address], [place], [place] [address]    Special Requests   Final    BOTTLES DRAWN AEROBIC ONLY Blood Culture adequate volume Performed at [facility] [address] [place], [place] [address]    Culture   Final    NO GROWTH < 12 HOURS Performed at [facility] [address], [place],  [address]    Report Status PENDING  Incomplete     Studies: Vas [place] Lower Extremity Venous (dvt) (only Mc & Wl 7a-7p)  Result Date: 06/10/2019  Lower Venous Study Indications: Swelling, and bilateral lower extremity blisters.  Limitations: Body habitus, poor ultrasound/tissue interface and open wound. Comparison Study: bilateral lower extremity venous duplex 06/07/2018. Performing Technologist: Maudry Mayhew MHA, RDMS, RVT, RDCS  Examination Guidelines: A complete evaluation includes B-mode imaging, spectral Doppler, color Doppler, and power Doppler as needed of all accessible portions of each vessel. Bilateral testing is considered an integral part of a complete examination. Limited examinations for reoccurring indications may be performed as noted.  +---------+---------------+---------+-----------+----------+--------------+ RIGHT    CompressibilityPhasicitySpontaneityPropertiesSummary        +---------+---------------+---------+-----------+----------+--------------+ CFV      Full                                                         +---------+---------------+---------+-----------+----------+--------------+ SFJ      Full                                                        +---------+---------------+---------+-----------+----------+--------------+ FV Prox  Full                                                        +---------+---------------+---------+-----------+----------+--------------+  FV Mid   Full                                                        +---------+---------------+---------+-----------+----------+--------------+ FV DistalFull                                                        +---------+---------------+---------+-----------+----------+--------------+ PFV      Full                                                        +---------+---------------+---------+-----------+----------+--------------+ POP      Full                                                        +---------+---------------+---------+-----------+----------+--------------+ PTV                                                   Not visualized +---------+---------------+---------+-----------+----------+--------------+ PERO                                                  Not visualized +---------+---------------+---------+-----------+----------+--------------+   +---------+---------------+---------+-----------+----------+-------+ LEFT     CompressibilityPhasicitySpontaneityPropertiesSummary +---------+---------------+---------+-----------+----------+-------+ CFV      Full           Yes      Yes                          +---------+---------------+---------+-----------+----------+-------+ SFJ      Full                                                 +---------+---------------+---------+-----------+----------+-------+ FV Prox  Full                                                 +---------+---------------+---------+-----------+----------+-------+ FV Mid   Full                                                  +---------+---------------+---------+-----------+----------+-------+ FV DistalFull                                                 +---------+---------------+---------+-----------+----------+-------+  PFV      Full                                                 +---------+---------------+---------+-----------+----------+-------+ POP      Full           Yes      Yes                          +---------+---------------+---------+-----------+----------+-------+ PTV      Full                                                 +---------+---------------+---------+-----------+----------+-------+ PERO     Full                                                 +---------+---------------+---------+-----------+----------+-------+     Summary: Right: There is no evidence of deep vein thrombosis in the lower extremity. However, portions of this examination were limited- see technologist comments above. No cystic structure found in the popliteal fossa. Left: There is no evidence of deep vein thrombosis in the lower extremity. However, portions of this examination were limited- see technologist comments above. No cystic structure found in the popliteal fossa.  *See table(s) above for measurements and observations. Electronically signed by Servando Snare MD on 06/10/2019 at 5:10:10 PM.    Final    Scheduled Meds: . ferrous sulfate  325 mg Oral BID WC  . furosemide  80 mg Intravenous BID  . Gerhardt's butt cream   Topical Daily  . heparin  5,000 Units Subcutaneous Q8H  . levothyroxine  75 mcg Oral QAC breakfast  . loratadine  10 mg Oral Daily  . multivitamin with minerals  1 tablet Oral Daily  . pantoprazole  40 mg Oral BID  . polyethylene glycol  17 g Oral QHS  . simvastatin  40 mg Oral QHS   Continuous Infusions: . sodium chloride    . cefTRIAXone (ROCEPHIN)  IV 1 g (06/10/19 2128)  . magnesium sulfate bolus IVPB      Principal Problem:   Bilateral  leg edema Active Problems:   CKD (chronic kidney disease) stage 4, GFR 15-29 ml/min (HCC)   Diet-controlled diabetes mellitus (HCC)   GERD (gastroesophageal reflux disease)   High cholesterol   Hypothyroidism   Iron deficiency anemia   Essential hypertension   UTI (urinary tract infection)   Chronic diastolic CHF (congestive heart failure) (HCC)   Cellulitis of lower extremity   Acute on chronic diastolic CHF (congestive heart failure) ([place])   Hypomagnesemia  Time spent:   Irwin Brakeman, MD [facility] 06/11/2019, 10:50 AM    LOS: 1 day  How to contact the [facility] Attending or Consulting provider [place] or covering provider during after hours [place], for this patient?  1. Check the care team in [facility] and look for a) attending/consulting [facility] provider listed and b) the [facility] team listed 2. Log into [email or web address] and use [place]'s universal password to access. If you do not have the password, please  contact the hospital operator. 3. Locate the [facility] provider you are looking for under [facility] and page to a number that you can be directly reached. 4. If you still have difficulty reaching the provider, please page the [facility] (Director on Call) for the Hospitalists listed on amion for assistance.

## 2019-06-11 NOTE — Evaluation (Signed)
Occupational Therapy Evaluation Patient Details Name: Erika Fry MRN: [id number] DOB: [birth year] Today's Date: 06/11/2019    History of Present Illness 73 y.o. female with medical history significant of hypertension, hyperlipidemia, diet-controlled diabetes, GERD, hypothyroidism, gout, pacemaker placement, iron deficiency anemia, CKD-4, CHF, peripheral neuropathy, and recent R nephrostomy drain placed 04/2019 who presents with bilateral leg edema.   Clinical Impression   Pt was admitted for the above.  Eval was very limited due to bil LE pain and groin pain when urinating. Prior to blisters, pt was mod I for adls and doing well.  She stood yesterday with PT but declined today due to pain despite premedication. Will follow in acute setting with the goals listed below    Follow Up Recommendations  Supervision/Assistance - 24 hour;SNF    Equipment Recommendations  None recommended by OT    Recommendations for Other Services       Precautions / Restrictions Precautions Precautions: Fall Precaution Comments: large fluid filled blister on bil LEs, LEs very tender to touch. Do not cover feet with any covers  Restrictions Weight Bearing Restrictions: No      Mobility Bed Mobility               General bed mobility comments: not performed due to pain.  Pt reports she is rolling over with bedrail  Transfers                 General transfer comment: not attempted today due to pain    Balance                                           ADL either performed or assessed with clinical judgement   ADL Overall ADL's : Needs assistance/impaired                                       General ADL Comments: pt only seen at bed level due to high pain level despite premedication. Pt has 10/10 pain when urinating--on purewick.  8/10 bil LE pain.  Able to perform UB adls with set up and total A for LB adls.  Gave pt level 2 theraband with  written HEP to work on strengthening on her own     Vision         Perception     Praxis      Pertinent Vitals/Pain Pain Assessment: Faces Faces Pain Scale: Hurts whole lot Pain Location: urinating, and bil LEs  Pain Descriptors / Indicators: Burning Pain Intervention(s): Limited activity within patient's tolerance;Monitored during session     Hand Dominance Right   Extremity/Trunk Assessment Upper Extremity Assessment Upper Extremity Assessment: Overall WFL for tasks assessed(4/5)           Communication Communication Communication: No difficulties   Cognition Arousal/Alertness: Awake/alert Behavior During Therapy: WFL for tasks assessed/performed Overall Cognitive Status: Within Functional Limits for tasks assessed                                     General Comments       Exercises     Shoulder Instructions      Home Living Family/patient expects to be discharged to:: Private residence Living Arrangements: Alone  Available Help at Discharge: Family;Available 24 hours/day Type of Home: House             Bathroom Shower/Tub: Walk-in shower   Bathroom Toilet: Handicapped height     Home Equipment: Environmental consultant - 2 wheels;Cane - single point;Wheelchair - [employer];Shower seat          Prior Functioning/Environment Level of Independence: Independent with assistive device(s)        Comments: uses RW at baseline        OT Problem List: Decreased strength;Decreased activity tolerance;Pain(balance NT)      OT Treatment/Interventions: Self-care/ADL training;Therapeutic exercise;Patient/family education;Balance training;Therapeutic activities    OT Goals(Current goals can be found in the care plan section) Acute Rehab OT Goals Patient Stated Goal: less pain OT Goal Formulation: With patient Time For Goal Achievement: 06/25/19 Potential to Achieve Goals: Fair ADL Goals Pt Will Transfer to Toilet: with min  assist;bedside commode;stand pivot transfer Additional ADL Goal #1: pt will go from sit to stand with min guard assist or roll at same level for LB adls Additional ADL Goal #2: pt will be independent with written bil UE level 2 theraband HEP  OT Frequency: Min 2X/week   Barriers to D/C:            Co-evaluation              AM-PAC OT "6 Clicks" Daily Activity     Outcome Measure Help from another person eating meals?: None Help from another person taking care of personal grooming?: A Little Help from another person toileting, which includes using toliet, bedpan, or urinal?: Total Help from another person bathing (including washing, rinsing, drying)?: A Lot Help from another person to put on and taking off regular upper body clothing?: A Little Help from another person to put on and taking off regular lower body clothing?: Total 6 Click Score: 14   End of Session    Activity Tolerance: Patient limited by pain Patient left: in bed;with call bell/phone within reach  OT Visit Diagnosis: Pain Pain - part of body: Leg(bil)                Time: 1003-1016 OT Time Calculation (min): 13 min Charges:  OT General Charges $OT Visit: 1 Visit OT Evaluation $OT Eval Low Complexity: 1 Low  Lesle Chris, OTR/L Acute Rehabilitation Services [phone] [facility] pager [phone] office 06/11/2019  [place] 06/11/2019, 11:56 AM

## 2019-06-11 NOTE — Progress Notes (Signed)
Pt refused to ambulate in room. States "my legs hurt to bad right now to walk".

## 2019-06-12 LAB — CBC
HCT: 30.5 % — ABNORMAL LOW (ref 36.0–46.0)
Hemoglobin: 9.6 g/dL — ABNORMAL LOW (ref 12.0–15.0)
MCH: 29.6 pg (ref 26.0–34.0)
MCHC: 31.5 g/dL (ref 30.0–36.0)
MCV: 94.1 fL (ref 80.0–100.0)
Platelets: 244 10*3/uL (ref 150–400)
RBC: 3.24 MIL/uL — ABNORMAL LOW (ref 3.87–5.11)
RDW: 13.9 % (ref 11.5–15.5)
WBC: 7.2 10*3/uL (ref 4.0–10.5)
nRBC: 0 % (ref 0.0–0.2)

## 2019-06-12 LAB — RENAL FUNCTION PANEL
Albumin: 3.1 g/dL — ABNORMAL LOW (ref 3.5–5.0)
Anion gap: 12 (ref 5–15)
BUN: 69 mg/dL — ABNORMAL HIGH (ref 8–23)
CO2: 23 mmol/L (ref 22–32)
Calcium: 9.4 mg/dL (ref 8.9–10.3)
Chloride: 100 mmol/L (ref 98–111)
Creatinine, Ser: 3.65 mg/dL — ABNORMAL HIGH (ref 0.44–1.00)
GFR calc Af Amer: 13 mL/min — ABNORMAL LOW (ref >60)
GFR calc non Af Amer: 12 mL/min — ABNORMAL LOW (ref >60)
Glucose, Bld: 133 mg/dL — ABNORMAL HIGH (ref 70–99)
Phosphorus: 3.8 mg/dL (ref 2.5–4.6)
Potassium: 3.7 mmol/L (ref 3.5–5.1)
Sodium: 135 mmol/L (ref 135–145)

## 2019-06-12 LAB — MAGNESIUM: Magnesium: 1.9 mg/dL (ref 1.7–2.4)

## 2019-06-12 NOTE — Progress Notes (Addendum)
PROGRESS NOTE  Erika Fry  [id number]:[id number]  DOB: Aug 21, 1946  DOA: 06/09/2019 PCP: [name], MD   Brief Admission Hx: 73 y.o. female with medical history significant of hypertension, hyperlipidemia, diet-controlled diabetes, GERD, hypothyroidism, gout, pacemaker placement, iron deficiency anemia, CKD-4, CHF, who presents with bilateral leg edema.  MDM/Assessment & Plan:   Recurrent bilateral leg edema - LE dopplers neg for DVT.  Suspect acute on chronic fluid retention from diastolic CHF.  Continue IV diuresis and elevation of extremities.  She is clinically improving with IV diuresis.  She continues to have a lot of edema and needs ongoing IV diuresis.    Cellulitis LEs - mild - Continue IV ceftriaxone.  Follow clinically.  This is improving with treatments.  Wounds BLEs - bullous lesions and scaling of skin - consult to [facility] nurse.  Legs are wrapped now.   DM type 2 - diet controlled.  GERD - continue protonix for GI protection.  Hyperlipidemia - continue simvastatin.  AKI on Stage 4 CKD - creatinine slightly improved with IV diuresis.  UTI - Treated with IV ceftriaxone.  Urine culture no significant growth.  Hypothyroidism - continue levothyroxine.  Chronic diastolic CHF with mild exacerbation - continue IV diuresis.   Diabetic neuropathy painful - allodynia of both feet, treat symptoms.  Pt and family requested palliative consult for end of life pain management, goals of care and home hospice.  Palliative has seen patient and planning to follow up on Monday.    DVT prophylaxis: heparin SQ Code Status: Full  Family Communication: spoke with Anderson Malta / telephone  Disposition Plan: continue inpatient for IV diuresis    Consultants: Palliative medicine  Procedures:   Antimicrobials: Ceftriaxone 7/9 >   Subjective: Pt reports persistent painful lower extremities.  She remains unable to ambulate due to pain in legs and feet and edema in legs.  She is allowing staff  to wrap legs.    Objective: Vitals:   06/11/19 1418 06/11/19 2100 06/12/19 0503 06/12/19 0505  BP: 120/66 120/69  128/73  Pulse: 76 73  74  Resp: 14 15  15   Temp: 98.8 F (37.1 C) 98.4 F (36.9 C)  98.3 F (36.8 C)  TempSrc: Oral Oral  Oral  SpO2: 98% 97%  100%  Weight:   67.1 kg   Height:        Intake/Output Summary (Last 24 hours) at 06/12/2019 1021 Last data filed at 06/12/2019 1962 Gross per 24 hour  Intake 840 ml  Output 2400 ml  Net -1560 ml   Filed Weights   06/10/19 0519 06/11/19 0500 06/12/19 0503  Weight: 69.4 kg 68.4 kg 67.1 kg   REVIEW OF SYSTEMS  As per history otherwise all reviewed and reported negative  Exam:  General exam: awake, alert, NAD, cooperative.  Respiratory system: Clear. No increased work of breathing. Cardiovascular system: S1 & S2 heard. No JVD, murmurs, gallops, clicks or pedal edema. Gastrointestinal system: Abdomen is nondistended, soft and nontender. Normal bowel sounds heard. Central nervous system: Alert and oriented. No focal neurological deficits. Extremities: improving edema bilateral LEs, allodynia bilateral feet unchanged.  Data Reviewed: Basic Metabolic Panel: Recent Labs  Lab 06/09/19 1717 06/09/19 2209 06/11/19 0742  NA 133*  --  135  K 4.1  --  3.7  CL 98  --  100  CO2 20*  --  21*  GLUCOSE 102*  --  99  BUN 97*  --  79*  CREATININE 4.07* 4.01* 3.63*  CALCIUM 9.8  --  9.3  MG  --   --  1.4*  PHOS  --   --  4.1   Liver Function Tests: Recent Labs  Lab 06/11/19 0742  ALBUMIN 3.1*   No results for input(s): LIPASE, AMYLASE in the last 168 hours. No results for input(s): AMMONIA in the last 168 hours. CBC: Recent Labs  Lab 06/07/19 1327 06/09/19 1717 06/11/19 0742  WBC  --  9.0 6.4  NEUTROABS  --  6.2 4.3  HGB 9.9* 9.9* 9.5*  HCT  --  30.3* 29.0*  MCV  --  90.2 92.4  PLT  --  239 216   Cardiac Enzymes: No results for input(s): CKTOTAL, CKMB, CKMBINDEX, TROPONINI in the last 168 hours. CBG (last  3)  Recent Labs    06/10/19 0817 06/10/19 1232 06/10/19 1609  GLUCAP 91 117* 133*   Recent Results (from the past 240 hour(s))  Urine culture     Status: Abnormal   Collection Time: 06/09/19  5:17 PM   Specimen: Urine, Clean Catch  Result Value Ref Range Status   Specimen Description   Final    URINE, CLEAN CATCH Performed at [facility], [place] [address] [place] [address] [place], [place] [address]    Special Requests   Final    NONE Performed at [facility], [place] [address] [place], [place] [address]    Culture MULTIPLE SPECIES PRESENT, SUGGEST RECOLLECTION (A)  Final   Report Status 06/10/2019 FINAL  Final  SARS Coronavirus 2 (CEPHEID - Performed in [place] hospital lab), Hosp Order     Status: None   Collection Time: 06/09/19  5:17 PM   Specimen: Nasopharyngeal Swab  Result Value Ref [place] Status   SARS Coronavirus 2 NEGATIVE NEGATIVE Final    Comment: (NOTE) If result is NEGATIVE SARS-CoV-2 target nucleic acids are NOT DETECTED. The SARS-CoV-2 RNA is generally detectable in upper and lower  respiratory specimens during the acute phase of infection. The lowest  concentration of SARS-CoV-2 viral copies this assay can detect is 250  copies / mL. A negative result does not preclude SARS-CoV-2 infection  and should not be used as the sole basis for treatment or other  patient management decisions.  A negative result may occur with  improper specimen collection / handling, submission of specimen other  than nasopharyngeal swab, presence of viral mutation(s) within the  areas targeted by this assay, and inadequate number of viral copies  (<250 copies / mL). A negative result must be combined with clinical  observations, patient history, and epidemiological information. If result is POSITIVE SARS-CoV-2 target nucleic acids are DETECTED. The SARS-CoV-2 RNA is generally detectable in upper and lower  respiratory specimens dur ing the acute phase of  infection.  Positive  results are indicative of active infection with SARS-CoV-2.  Clinical  correlation with patient history and other diagnostic information is  necessary to determine patient infection status.  Positive results do  not rule out bacterial infection or co-infection with other viruses. If result is PRESUMPTIVE POSTIVE SARS-CoV-2 nucleic acids MAY BE PRESENT.   A presumptive positive result was obtained on the submitted specimen  and confirmed on repeat testing.  While 2019 novel coronavirus  (SARS-CoV-2) nucleic acids may be present in the submitted sample  additional confirmatory testing may be necessary for epidemiological  and / or clinical management purposes  to differentiate between  SARS-CoV-2 and other Sarbecovirus currently known to infect humans.  If clinically indicated additional testing with an alternate test  methodology [phone]) is advised. The SARS-CoV-2 RNA is generally  detectable in upper and lower respiratory sp ecimens during the acute  phase of infection. The expected result is Negative. Fact Sheet for Patients:  StrictlyIdeas.no Fact Sheet for Healthcare Providers: BankingDealers.co.za This test is not yet approved or cleared by the [place] FDA and has been authorized for detection and/or diagnosis of SARS-CoV-2 by FDA under an Emergency Use Authorization (EUA).  This EUA will remain in effect (meaning this test can be used) for the duration of the COVID-19 declaration under Section 564(b)(1) of the Act, 21 U.S.C. section 360bbb-3(b)(1), unless the authorization is terminated or revoked sooner. Performed at [facility], [place] [address], [place], [place] [address]   Culture, blood (routine x 2)     Status: None (Preliminary result)   Collection Time: 06/09/19  5:41 PM   Specimen: BLOOD  Result Value Ref [place] Status   Specimen Description   Final    BLOOD RIGHT  ANTECUBITAL Performed at [place] [address], [place], [place] [address]    Special Requests   Final    BOTTLES DRAWN AEROBIC AND ANAEROBIC Blood Culture adequate volume Performed at [place] [address], [place], [place] [address]    Culture   Final    NO GROWTH 2 DAYS Performed at [place] [address], [place], [place] [address]    Report Status PENDING  Incomplete  Culture, blood (routine x 2)     Status: None (Preliminary result)   Collection Time: 06/09/19  9:58 PM   Specimen: BLOOD  Result Value Ref [place] Status   Specimen Description   Final    BLOOD BLOOD LEFT HAND Performed at [place] [address] [place], [place] [address]    Special Requests   Final    BOTTLES DRAWN AEROBIC ONLY Blood Culture adequate volume Performed at [place] [address] [place], [place] [address]    Culture   Final    NO GROWTH 2 DAYS Performed at [place] [address] [place], [place] [address]    Report Status PENDING  Incomplete     Studies: No results found. Scheduled Meds:  ferrous sulfate  325 mg Oral BID WC   furosemide  80 mg Intravenous BID   Gerhardt's butt cream   Topical Daily   heparin  5,000 Units Subcutaneous Q8H   levothyroxine  75 mcg Oral QAC breakfast   loratadine  10 mg Oral Daily   multivitamin with minerals  1 tablet Oral Daily   pantoprazole  40 mg Oral BID   polyethylene glycol  17 g Oral QHS   simvastatin  40 mg Oral QHS   Continuous Infusions:  sodium chloride     cefTRIAXone (ROCEPHIN)  IV 1 g (06/11/19 2239)    Principal Problem:   Bilateral leg edema Active Problems:   Chronic kidney disease (CKD), stage IV (severe) (HCC)   Diet-controlled diabetes mellitus (HCC)   GERD (gastroesophageal reflux disease)   High cholesterol   Hypothyroidism   Iron deficiency anemia   Essential hypertension   UTI (urinary tract  infection)   Chronic diastolic CHF (congestive heart failure) (HCC)   Cellulitis of lower extremity   Acute on chronic diastolic CHF (congestive heart failure) ([place])   Hypomagnesemia   DNR (do not resuscitate) discussion   Cystitis  Time spent:   Irwin Brakeman, MD [facility] 06/12/2019,  10:21 AM    LOS: 2 days  How to contact the [facility] Attending or Consulting provider [place] or covering provider during after hours [place], for this patient?  Check the care team in [facility] and look for a) attending/consulting TRH provider listed and b) the [facility] team listed Log into [email or web address] and use [place]'s universal password to access. If you do not have the password, please contact the hospital operator. Locate the [facility] provider you are looking for under [facility] and page to a number that you can be directly reached. If you still have difficulty reaching the provider, please page the [facility] (Director on Call) for the Hospitalists listed on amion for assistance.

## 2019-06-13 LAB — RENAL FUNCTION PANEL
Albumin: 3.1 g/dL — ABNORMAL LOW (ref 3.5–5.0)
Anion gap: 16 — ABNORMAL HIGH (ref 5–15)
BUN: 64 mg/dL — ABNORMAL HIGH (ref 8–23)
CO2: 20 mmol/L — ABNORMAL LOW (ref 22–32)
Calcium: 9.6 mg/dL (ref 8.9–10.3)
Chloride: 98 mmol/L (ref 98–111)
Creatinine, Ser: 3.59 mg/dL — ABNORMAL HIGH (ref 0.44–1.00)
GFR calc Af Amer: 14 mL/min — ABNORMAL LOW (ref >60)
GFR calc non Af Amer: 12 mL/min — ABNORMAL LOW (ref >60)
Glucose, Bld: 103 mg/dL — ABNORMAL HIGH (ref 70–99)
Phosphorus: 4.6 mg/dL (ref 2.5–4.6)
Potassium: 3.9 mmol/L (ref 3.5–5.1)
Sodium: 134 mmol/L — ABNORMAL LOW (ref 135–145)

## 2019-06-13 LAB — CBC
HCT: 29.9 % — ABNORMAL LOW (ref 36.0–46.0)
Hemoglobin: 9.5 g/dL — ABNORMAL LOW (ref 12.0–15.0)
MCH: 29.8 pg (ref 26.0–34.0)
MCHC: 31.8 g/dL (ref 30.0–36.0)
MCV: 93.7 fL (ref 80.0–100.0)
Platelets: 217 10*3/uL (ref 150–400)
RBC: 3.19 MIL/uL — ABNORMAL LOW (ref 3.87–5.11)
RDW: 14.1 % (ref 11.5–15.5)
WBC: 8.1 10*3/uL (ref 4.0–10.5)
nRBC: 0 % (ref 0.0–0.2)

## 2019-06-13 LAB — MAGNESIUM: Magnesium: 1.9 mg/dL (ref 1.7–2.4)

## 2019-06-13 MED ORDER — POLYETHYLENE GLYCOL 3350 17 G PO PACK
17.0000 g | PACK | Freq: Two times a day (BID) | ORAL | Status: DC
  Administered 2019-06-13 – 2019-06-30 (×33): 17 g via ORAL
  Filled 2019-06-13 (×37): qty 1

## 2019-06-13 MED ORDER — DOCUSATE SODIUM 100 MG PO CAPS
100.0000 mg | ORAL_CAPSULE | Freq: Two times a day (BID) | ORAL | Status: DC
  Administered 2019-06-13 – 2019-06-30 (×35): 100 mg via ORAL
  Filled 2019-06-13 (×36): qty 1

## 2019-06-13 MED ORDER — FUROSEMIDE 10 MG/ML IJ SOLN
60.0000 mg | Freq: Two times a day (BID) | INTRAMUSCULAR | Status: DC
  Administered 2019-06-13 – 2019-06-14 (×4): 60 mg via INTRAVENOUS
  Filled 2019-06-13 (×4): qty 6

## 2019-06-13 NOTE — Plan of Care (Signed)

## 2019-06-13 NOTE — Progress Notes (Signed)
PROGRESS NOTE  Erika Fry  FUX:[id number]  DOB: [birth year]  DOA: 06/09/2019 PCP: [name], MD   Brief Admission Hx: 73 y.o. female with medical history significant of hypertension, hyperlipidemia, diet-controlled diabetes, GERD, hypothyroidism, gout, pacemaker placement, iron deficiency anemia, CKD-4, CHF, who presents with bilateral leg edema.  MDM/Assessment & Plan:   1. Recurrent bilateral leg edema - LE dopplers neg for DVT.  Suspect acute on chronic fluid retention from diastolic CHF.  Continue IV diuresis and elevation of extremities.  She is clinically improving with IV diuresis.  Her legs are improving however, she continues to have a lot of edema and needs ongoing IV diuresis.   I have reduced dose of IV lasix to 60 mg IV BID.  2. Cellulitis LEs - mild - Continue IV ceftriaxone x 5 doses.  Follow clinically.  This is improving with treatments.  3. Wounds BLEs - bullous lesions and scaling of skin - consult to [facility] nurse.  Legs are wrapped now.   4. DM type 2 - diet controlled.  5. GERD - continue protonix for GI protection.  6. Hyperlipidemia - continue simvastatin.  7. AKI on Stage 4 CKD - creatinine slightly improved with IV diuresis.  8. UTI - Treated with IV ceftriaxone.  Urine culture no significant growth.  9. Hypothyroidism - continue levothyroxine.  10. Chronic diastolic CHF with mild exacerbation - continue IV diuresis.   11. Diabetic neuropathy painful - allodynia of both feet, treat symptoms.  Pt and family requested palliative consult for end of life pain management, goals of care and home hospice.  Palliative has seen patient and planning to follow up on Monday.    DVT prophylaxis: heparin SQ Code Status: Full  Family Communication: spoke with Anderson Malta / telephone  Disposition Plan: continue inpatient for IV diuresis   Consultants:  Palliative medicine  Procedures:    Antimicrobials:  Ceftriaxone 7/9 >   Subjective: Pt reports less edema  in legs but has had no BM and unable to walk or stand on legs.      Objective: Vitals:   06/12/19 1525 06/12/19 2058 06/13/19 0500 06/13/19 0520  BP:  137/77  123/74  Pulse:  79  72  Resp:  18  18  Temp:  98 F (36.7 C)  98.3 F (36.8 C)  TempSrc:  Oral  Oral  SpO2: 93% 99%  99%  Weight:   64.6 kg   Height:        Intake/Output Summary (Last 24 hours) at 06/13/2019 1227 Last data filed at 06/13/2019 0254 Gross per 24 hour  Intake 820.07 ml  Output 2601 ml  Net -1780.93 ml   Filed Weights   06/11/19 0500 06/12/19 0503 06/13/19 0500  Weight: 68.4 kg 67.1 kg 64.6 kg   REVIEW OF SYSTEMS  As per history otherwise all reviewed and reported negative  Exam:  General exam: awake, alert, NAD, cooperative.  Respiratory system: Clear. No increased work of breathing. Cardiovascular system: S1 & S2 heard. No JVD, murmurs, gallops, clicks or pedal edema. Gastrointestinal system: Abdomen is nondistended, soft and nontender. Normal bowel sounds heard. Central nervous system: Alert and oriented. No focal neurological deficits. Extremities: improving edema bilateral LEs, allodynia bilateral feet unchanged.  Data Reviewed: Basic Metabolic Panel: Recent Labs  Lab 06/09/19 1717 06/09/19 2209 06/11/19 0742 06/12/19 0934 06/13/19 0801  NA 133*  --  135 135 134*  K 4.1  --  3.7 3.7 3.9  CL 98  --  100 100 98  CO2 20*  --  21* 23 20*  GLUCOSE 102*  --  99 133* 103*  BUN 97*  --  79* 69* 64*  CREATININE 4.07* 4.01* 3.63* 3.65* 3.59*  CALCIUM 9.8  --  9.3 9.4 9.6  MG  --   --  1.4* 1.9 1.9  PHOS  --   --  4.1 3.8 4.6   Liver Function Tests: Recent Labs  Lab 06/11/19 0742 06/12/19 0934 06/13/19 0801  ALBUMIN 3.1* 3.1* 3.1*   No results for input(s): LIPASE, AMYLASE in the last 168 hours. No results for input(s): AMMONIA in the last 168 hours. CBC: Recent Labs  Lab 06/07/19 1327 06/09/19 1717 06/11/19 0742 06/12/19 0934 06/13/19 0801  WBC  --  9.0 6.4 7.2 8.1   NEUTROABS  --  6.2 4.3  --   --   HGB 9.9* 9.9* 9.5* 9.6* 9.5*  HCT  --  30.3* 29.0* 30.5* 29.9*  MCV  --  90.2 92.4 94.1 93.7  PLT  --  239 216 244 217   Cardiac Enzymes: No results for input(s): CKTOTAL, CKMB, CKMBINDEX, TROPONINI in the last 168 hours. CBG (last 3)  Recent Labs    06/10/19 1232 06/10/19 1609  GLUCAP 117* 133*   Recent Results (from the past 240 hour(s))  Urine culture     Status: Abnormal   Collection Time: 06/09/19  5:17 PM   Specimen: Urine, Clean Catch  Result Value Ref Range Status   Specimen Description   Final    URINE, CLEAN CATCH Performed at [facility], [place] [address] [place], [place] [address]    Special Requests   Final    NONE Performed at [facility], [place] [address], [place], [place] [address]    Culture MULTIPLE SPECIES PRESENT, SUGGEST RECOLLECTION (A)  Final   Report Status 06/10/2019 FINAL  Final  SARS Coronavirus 2 (CEPHEID - Performed in [place] hospital lab), Hosp Order     Status: None   Collection Time: 06/09/19  5:17 PM   Specimen: Nasopharyngeal Swab  Result Value Ref Range Status   SARS Coronavirus 2 NEGATIVE NEGATIVE Final    Comment: (NOTE) If result is NEGATIVE SARS-CoV-2 target nucleic acids are NOT DETECTED. The SARS-CoV-2 RNA is generally detectable in upper and lower  respiratory specimens during the acute phase of infection. The lowest  concentration of SARS-CoV-2 viral copies this assay can detect is 250  copies / mL. A negative result does not preclude SARS-CoV-2 infection  and should not be used as the sole basis for treatment or other  patient management decisions.  A negative result may occur with  improper specimen collection / handling, submission of specimen other  than nasopharyngeal swab, presence of viral mutation(s) within the  areas targeted by this assay, and inadequate number of viral copies  (<250 copies / mL). A negative result must be combined with  clinical  observations, patient history, and epidemiological information. If result is POSITIVE SARS-CoV-2 target nucleic acids are DETECTED. The SARS-CoV-2 RNA is generally detectable in upper and lower  respiratory specimens dur ing the acute phase of infection.  Positive  results are indicative of active infection with SARS-CoV-2.  Clinical  correlation with patient history and other diagnostic information is  necessary to determine patient infection status.  Positive results do  not rule out bacterial infection or co-infection with other viruses. If result is PRESUMPTIVE POSTIVE SARS-CoV-2 nucleic acids MAY BE PRESENT.   A presumptive positive result  was obtained on the submitted specimen  and confirmed on repeat testing.  While 2019 novel coronavirus  (SARS-CoV-2) nucleic acids may be present in the submitted sample  additional confirmatory testing may be necessary for epidemiological  and / or clinical management purposes  to differentiate between  SARS-CoV-2 and other Sarbecovirus currently known to infect humans.  If clinically indicated additional testing with an alternate test  methodology [phone]) is advised. The SARS-CoV-2 RNA is generally  detectable in upper and lower respiratory sp ecimens during the acute  phase of infection. The expected result is Negative. Fact Sheet for Patients:  StrictlyIdeas.no Fact Sheet for Healthcare Providers: BankingDealers.co.za This test is not yet approved or cleared by the [place] FDA and has been authorized for detection and/or diagnosis of SARS-CoV-2 by FDA under an Emergency Use Authorization (EUA).  This EUA will remain in effect (meaning this test can be used) for the duration of the COVID-19 declaration under Section 564(b)(1) of the Act, 21 U.S.C. section 360bbb-3(b)(1), unless the authorization is terminated or revoked sooner. Performed at [facility],  [place] [address], [place], [place] [address]   Culture, blood (routine x 2)     Status: None (Preliminary result)   Collection Time: 06/09/19  5:41 PM   Specimen: BLOOD  Result Value Ref [place] Status   Specimen Description   Final    BLOOD RIGHT ANTECUBITAL Performed at [place] [address], [place], [place] [address]    Special Requests   Final    BOTTLES DRAWN AEROBIC AND ANAEROBIC Blood Culture adequate volume Performed at [place] [address] [place], [place] [address]    Culture   Final    NO GROWTH 3 DAYS Performed at [facility], [place] [address] [place], [place] [address]    Report Status PENDING  Incomplete  Culture, blood (routine x 2)     Status: None (Preliminary result)   Collection Time: 06/09/19  9:58 PM   Specimen: BLOOD  Result Value Ref [place] Status   Specimen Description   Final    BLOOD BLOOD LEFT HAND Performed at [place] [address] [place], [place] [address]    Special Requests   Final    BOTTLES DRAWN AEROBIC ONLY Blood Culture adequate volume Performed at [name] [address] [place], [place] [address]    Culture   Final    NO GROWTH 3 DAYS Performed at [facility], [place] [address], [place],  [address]    Report Status PENDING  Incomplete     Studies: No results found. Scheduled Meds: . docusate sodium  100 mg Oral BID  . ferrous sulfate  325 mg Oral BID WC  . furosemide  60 mg Intravenous BID  . Gerhardt's butt cream   Topical Daily  . heparin  5,000 Units Subcutaneous Q8H  . levothyroxine  75 mcg Oral QAC breakfast  . loratadine  10 mg Oral Daily  . multivitamin with minerals  1 tablet Oral Daily  . pantoprazole  40 mg Oral BID  . polyethylene glycol  17 g Oral BID  . simvastatin  40 mg Oral QHS   Continuous Infusions: . sodium chloride    . cefTRIAXone (ROCEPHIN)  IV Stopped (06/12/19 2254)    Principal  Problem:   Bilateral leg edema Active Problems:   Chronic kidney disease (CKD), stage IV (severe) (HCC)   Diet-controlled diabetes mellitus ([place])  GERD (gastroesophageal reflux disease)   High cholesterol   Hypothyroidism   Iron deficiency anemia   Essential hypertension   UTI (urinary tract infection)   Chronic diastolic CHF (congestive heart failure) (HCC)   Cellulitis of lower extremity   Acute on chronic diastolic CHF (congestive heart failure) ([place])   Hypomagnesemia   DNR (do not resuscitate) discussion   Cystitis  Time spent:   Irwin Brakeman, MD [facility] 06/13/2019, 12:27 PM    LOS: 3 days  How to contact the [facility] Attending or Consulting provider [place] or covering provider during after hours [place], for this patient?  1. Check the care team in [facility] and look for a) attending/consulting TRH provider listed and b) the [facility] team listed 2. Log into [email or web address] and use [place]'s universal password to access. If you do not have the password, please contact the hospital operator. 3. Locate the [facility] provider you are looking for under [facility] and page to a number that you can be directly reached. 4. If you still have difficulty reaching the provider, please page the [facility] (Director on Call) for the Hospitalists listed on amion for assistance.

## 2019-06-14 DIAGNOSIS — M79605 Pain in left leg: Secondary | ICD-10-CM

## 2019-06-14 DIAGNOSIS — M79604 Pain in right leg: Secondary | ICD-10-CM

## 2019-06-14 LAB — CULTURE, BLOOD (ROUTINE X 2)
Culture: NO GROWTH
Culture: NO GROWTH
Special Requests: ADEQUATE
Special Requests: ADEQUATE

## 2019-06-14 MED ORDER — GABAPENTIN 100 MG PO CAPS
100.0000 mg | ORAL_CAPSULE | Freq: Three times a day (TID) | ORAL | Status: DC
  Administered 2019-06-14 – 2019-06-30 (×33): 100 mg via ORAL
  Filled 2019-06-14 (×39): qty 1

## 2019-06-14 MED ORDER — SENNA 8.6 MG PO TABS
1.0000 | ORAL_TABLET | Freq: Every day | ORAL | Status: DC
  Administered 2019-06-14 – 2019-06-29 (×16): 8.6 mg via ORAL
  Filled 2019-06-14 (×16): qty 1

## 2019-06-14 MED ORDER — OXYCODONE HCL 5 MG PO TABS
2.5000 mg | ORAL_TABLET | ORAL | Status: DC | PRN
  Administered 2019-06-14 – 2019-06-30 (×63): 5 mg via ORAL
  Filled 2019-06-14 (×64): qty 1

## 2019-06-14 NOTE — Progress Notes (Signed)
PROGRESS NOTE  Erika Fry  YHC:[id number]  DOB: [birth year]  DOA: 06/09/2019 PCP: [name], MD   Brief Admission Hx: 73 y.o. female with medical history significant of hypertension, hyperlipidemia, diet-controlled diabetes, GERD, hypothyroidism, gout, pacemaker placement, iron deficiency anemia, CKD-4, CHF, who presents with bilateral leg edema.  MDM/Assessment & Plan:   1. Recurrent bilateral leg edema - LE dopplers neg for DVT.  Secondary to acute on chronic fluid retention from diastolic CHF.  Continue IV diuresis and elevation of extremities.  She is clinically improving with IV diuresis.  Her legs are improving however, she continues to have a lot of edema and needs ongoing IV diuresis.   I have reduced dose of IV lasix to 60 mg IV BID.  2. Cellulitis LEs - mild - Continue IV ceftriaxone x 5 doses.  Follow clinically.  This is improving with treatments.  3. Wounds BLEs - bullous lesions and scaling of skin - consult to [facility] nurse.  Legs are wrapped now.   4. DM type 2 - diet controlled.  5. GERD - continue protonix for GI protection.  6. Hyperlipidemia - continue simvastatin.  7. AKI on Stage 4 CKD - creatinine slightly improved with IV diuresis.  8. UTI - Treated with IV ceftriaxone.  Urine culture no significant growth.  9. Hypothyroidism - continue levothyroxine.  10. Chronic diastolic CHF with mild exacerbation - continue IV diuresis.   11. Diabetic neuropathy painful - allodynia of both feet, treat symptoms.  I have increased frequency of her pain medications as her oxycodone pain relief wears off too quickly.   Pt and family requested palliative consult for end of life pain management, goals of care and home hospice.  Palliative has seen patient and planning to follow up 7/13.    12. Constipation - Continue miralax BID, colace BID, add nightly senna.   DVT prophylaxis: heparin SQ Code Status: Full   Family Communication: spoke with Anderson Malta / telephone   Disposition Plan: continue inpatient for IV diuresis, anticipating patient will be medically ready for discharge 7/14.     Consultants:  Palliative medicine  Procedures:    Antimicrobials:  Ceftriaxone 7/9 >   Subjective: Pt reports severe pain in right leg from neuropathy, asking for pain medication, says it wears off too quickly.  No bowel movement.        Objective: Vitals:   06/13/19 1623 06/13/19 2002 06/14/19 0540 06/14/19 0608  BP: 112/77 117/69 133/77   Pulse: 73 70 73   Resp: 18 18 18    Temp: 98.8 F (37.1 C) 98.6 F (37 C) 98.1 F (36.7 C)   TempSrc: Oral Oral Oral   SpO2: 98% 95% 100%   Weight:    68.4 kg  Height:        Intake/Output Summary (Last 24 hours) at 06/14/2019 0958 Last data filed at 06/14/2019 0840 Gross per 24 hour  Intake 100 ml  Output 1350 ml  Net -1250 ml   Filed Weights   06/12/19 0503 06/13/19 0500 06/14/19 0608  Weight: 67.1 kg 64.6 kg 68.4 kg   REVIEW OF SYSTEMS  As per history otherwise all reviewed and reported negative  Exam:  General exam: awake, alert, NAD, cooperative.  Respiratory system: Clear. No increased work of breathing. Cardiovascular system: S1 & S2 heard. No JVD, murmurs, gallops, clicks or pedal edema. Gastrointestinal system: Abdomen is nondistended, soft and nontender. Normal bowel sounds heard. Central nervous system: Alert and oriented. No focal neurological deficits. Extremities: improving edema bilateral  LEs, allodynia bilateral feet unchanged.  Data Reviewed: Basic Metabolic Panel: Recent Labs  Lab 06/09/19 1717 06/09/19 2209 06/11/19 0742 06/12/19 0934 06/13/19 0801  NA 133*  --  135 135 134*  K 4.1  --  3.7 3.7 3.9  CL 98  --  100 100 98  CO2 20*  --  21* 23 20*  GLUCOSE 102*  --  99 133* 103*  BUN 97*  --  79* 69* 64*  CREATININE 4.07* 4.01* 3.63* 3.65* 3.59*  CALCIUM 9.8  --  9.3 9.4 9.6  MG  --   --  1.4* 1.9 1.9  PHOS  --   --  4.1 3.8 4.6   Liver Function Tests: Recent Labs   Lab 06/11/19 0742 06/12/19 0934 06/13/19 0801  ALBUMIN 3.1* 3.1* 3.1*   No results for input(s): LIPASE, AMYLASE in the last 168 hours. No results for input(s): AMMONIA in the last 168 hours. CBC: Recent Labs  Lab 06/07/19 1327 06/09/19 1717 06/11/19 0742 06/12/19 0934 06/13/19 0801  WBC  --  9.0 6.4 7.2 8.1  NEUTROABS  --  6.2 4.3  --   --   HGB 9.9* 9.9* 9.5* 9.6* 9.5*  HCT  --  30.3* 29.0* 30.5* 29.9*  MCV  --  90.2 92.4 94.1 93.7  PLT  --  239 216 244 217   Cardiac Enzymes: No results for input(s): CKTOTAL, CKMB, CKMBINDEX, TROPONINI in the last 168 hours. CBG (last 3)  No results for input(s): GLUCAP in the last 72 hours. Recent Results (from the past 240 hour(s))  Urine culture     Status: Abnormal   Collection Time: 06/09/19  5:17 PM   Specimen: Urine, Clean Catch  Result Value Ref Range Status   Specimen Description   Final    URINE, CLEAN CATCH Performed at [facility], [place] [address], [place], [place] [address]    Special Requests   Final    NONE Performed at [facility], [place] [address] [place], [place] [address]    Culture MULTIPLE SPECIES PRESENT, SUGGEST RECOLLECTION (A)  Final   Report Status 06/10/2019 FINAL  Final  SARS Coronavirus 2 (CEPHEID - Performed in [facility] hospital lab), Hosp Order     Status: None   Collection Time: 06/09/19  5:17 PM   Specimen: Nasopharyngeal Swab  Result Value Ref Range Status   SARS Coronavirus 2 NEGATIVE NEGATIVE Final    Comment: (NOTE) If result is NEGATIVE SARS-CoV-2 target nucleic acids are NOT DETECTED. The SARS-CoV-2 RNA is generally detectable in upper and lower  respiratory specimens during the acute phase of infection. The lowest  concentration of SARS-CoV-2 viral copies this assay can detect is 250  copies / mL. A negative result does not preclude SARS-CoV-2 infection  and should not be used as the sole basis for treatment or other  patient management  decisions.  A negative result may occur with  improper specimen collection / handling, submission of specimen other  than nasopharyngeal swab, presence of viral mutation(s) within the  areas targeted by this assay, and inadequate number of viral copies  (<250 copies / mL). A negative result must be combined with clinical  observations, patient history, and epidemiological information. If result is POSITIVE SARS-CoV-2 target nucleic acids are DETECTED. The SARS-CoV-2 RNA is generally detectable in upper and lower  respiratory specimens dur ing the acute phase of infection.  Positive  results are indicative of active infection with SARS-CoV-2.  Clinical  correlation with patient history and other diagnostic information is  necessary to determine patient infection status.  Positive results do  not rule out bacterial infection or co-infection with other viruses. If result is PRESUMPTIVE POSTIVE SARS-CoV-2 nucleic acids MAY BE PRESENT.   A presumptive positive result was obtained on the submitted specimen  and confirmed on repeat testing.  While 2019 novel coronavirus  (SARS-CoV-2) nucleic acids may be present in the submitted sample  additional confirmatory testing may be necessary for epidemiological  and / or clinical management purposes  to differentiate between  SARS-CoV-2 and other Sarbecovirus currently known to infect humans.  If clinically indicated additional testing with an alternate test  methodology [phone]) is advised. The SARS-CoV-2 RNA is generally  detectable in upper and lower respiratory sp ecimens during the acute  phase of infection. The expected result is Negative. Fact Sheet for Patients:  StrictlyIdeas.no Fact Sheet for Healthcare Providers: BankingDealers.co.za This test is not yet approved or cleared by the [place] FDA and has been authorized for detection and/or diagnosis of SARS-CoV-2 by FDA under an  Emergency Use Authorization (EUA).  This EUA will remain in effect (meaning this test can be used) for the duration of the COVID-19 declaration under Section 564(b)(1) of the Act, 21 U.S.C. section 360bbb-3(b)(1), unless the authorization is terminated or revoked sooner. Performed at [facility], [place] [address], [place], [place] [address]   Culture, blood (routine x 2)     Status: None (Preliminary result)   Collection Time: 06/09/19  5:41 PM   Specimen: BLOOD  Result Value Ref Range Status   Specimen Description   Final    BLOOD RIGHT ANTECUBITAL Performed at [place] [address] [place], [place] [address]    Special Requests   Final    BOTTLES DRAWN AEROBIC AND ANAEROBIC Blood Culture adequate volume Performed at [place] [address] [place], [place] [address]    Culture   Final    NO GROWTH 4 DAYS Performed at [facility], [place] [address] [place], [place] [address]    Report Status PENDING  Incomplete  Culture, blood (routine x 2)     Status: None (Preliminary result)   Collection Time: 06/09/19  9:58 PM   Specimen: BLOOD  Result Value Ref Range Status   Specimen Description   Final    BLOOD BLOOD LEFT HAND Performed at [place] [address] [place], [place] [address]    Special Requests   Final    BOTTLES DRAWN AEROBIC ONLY Blood Culture adequate volume Performed at [place] [address], [place],  [address]    Culture   Final    NO GROWTH 4 DAYS Performed at [facility], [place] [address], [place],  [address]    Report Status PENDING  Incomplete     Studies: No results found. Scheduled Meds: . docusate sodium  100 mg Oral BID  . ferrous sulfate  325 mg Oral BID WC  . furosemide  60 mg Intravenous BID  . Gerhardt's butt cream   Topical Daily  . heparin  5,000 Units Subcutaneous Q8H  . levothyroxine  75 mcg  Oral QAC breakfast  . loratadine  10 mg Oral Daily  . multivitamin with minerals  1 tablet Oral Daily  . pantoprazole  40 mg Oral BID  . polyethylene glycol  17 g Oral BID  . simvastatin  40 mg Oral  QHS   Continuous Infusions: . sodium chloride    . cefTRIAXone (ROCEPHIN)  IV Stopped (06/13/19 2145)    Principal Problem:   Bilateral leg edema Active Problems:   Chronic kidney disease (CKD), stage IV (severe) (HCC)   Diet-controlled diabetes mellitus (HCC)   GERD (gastroesophageal reflux disease)   High cholesterol   Hypothyroidism   Iron deficiency anemia   Essential hypertension   UTI (urinary tract infection)   Chronic diastolic CHF (congestive heart failure) (HCC)   Cellulitis of lower extremity   Acute on chronic diastolic CHF (congestive heart failure) ([place])   Hypomagnesemia   DNR (do not resuscitate) discussion   Cystitis  Time spent:   Irwin Brakeman, MD [facility] 06/14/2019, 9:58 AM    LOS: 4 days  How to contact the [facility] Attending or Consulting provider [place] or covering provider during after hours [place], for this patient?  1. Check the care team in [facility] and look for a) attending/consulting TR[facility] provider listed and b) the [facility] team listed 2. Log into [email or web address] and use [place]'s universal password to access. If you do not have the password, please contact the hospital operator. 3. Locate the [facility] provider you are looking for under [facility] and page to a number that you can be directly reached. 4. If you still have difficulty reaching the provider, please page the [facility] (Director on Call) for the Hospitalists listed on amion for assistance.

## 2019-06-14 NOTE — Progress Notes (Addendum)
Patient ID: Erika Fry, female   DOB: July 27, 1946, 73 y.o.   MRN: [id number]  This NP visited patient at the bedside as a follow up to last week's  GOCs meeting.  Visited patient at bedside, she is alert and oriented and tells me she "feels better.  However she continues to complain of overall weakness and fatigue, foot pain secondary to her neuropathy and ongoing pain with urination.  We discussed her current medical situation as it relates to anticipatory care needs.  Patient lives alone but she does have a supportive family 3 sons and 3 daughter-in-law's.  She gives me permission to talk with them today.  Conference call with family to discuss diagnosis, prognosis, goals of care, treatment options and anticipatory care needs.    Long telephone conference call with the patient's 3 sons and their 3 wives.  The family expressed anxiety and frustration with the overall medical situation with this patient over the last 9 months.  They feel that there is no coordination of care, there are not enough in-home services outside of out-of-pocket caregivers, and they do not understand why "the doctors just cannot fix this once and for all"  Ongoing education regarding the concept of adult failure to thrive secondary to multiple comorbidities within the context of human mortality.   Focus conversation regarding the patient's worsening kidney disease and likely future decision related to dialysis, risks and benefits and logistics discussed.  Emotional support offered.   I raised awareness to the importance of both the patient and family responsibility to not only advocate and coordinate patient's care but recognizing personal/patient decision regarding desire implement medical recommendations and again limitations of medical interventions.   Discussed with patient the importance of continued conversation with patient and the medical providers regarding overall plan of care and treatment options,   ensuring decisions are within the context of the patients values and GOCs.  Stressed the importance of advanced directive.    Questions and concerns addressed   Discussed with Dr  Wynetta Emery   recommend outpatient community-based palliative services to follow this patient at home.  Total time spent on the unit was 60 minutes  Greater than 50% of the time was spent in counseling and coordination of care  Wadie Lessen NP  Palliative Medicine Team Team Phone # [phone] Pager [phone]

## 2019-06-14 NOTE — Progress Notes (Signed)
This RN received patient as a transfer from 4E. Agree with previous nurse's assessment. Will continue to monitor.

## 2019-06-14 NOTE — Progress Notes (Signed)
PT Cancellation Note  Patient Details Name: Erika Fry MRN: [id number] DOB: 1946-03-23   Cancelled Treatment:     pt stated her R LE was "hurting too bad" to try anything right now.  Pt has been evaluated and a Palliative Care meeting is to take place today.   Rica Koyanagi  PTA Acute  Rehabilitation Services Pager      [phone] Office      [phone]

## 2019-06-14 NOTE — Care Management Important Message (Signed)
Important Message  Patient Details IM Letter given to Dessa Phi RN to present to the Patient Name: Erika Fry MRN: [id number] Date of Birth: 1946-02-07   Medicare Important Message Given:  Yes     Kerin Salen 06/14/2019, 12:09 PM

## 2019-06-14 NOTE — Plan of Care (Signed)

## 2019-06-15 DIAGNOSIS — R5381 Other malaise: Secondary | ICD-10-CM

## 2019-06-15 LAB — RENAL FUNCTION PANEL
Albumin: 3 g/dL — ABNORMAL LOW (ref 3.5–5.0)
Anion gap: 15 (ref 5–15)
BUN: 73 mg/dL — ABNORMAL HIGH (ref 8–23)
CO2: 19 mmol/L — ABNORMAL LOW (ref 22–32)
Calcium: 9.5 mg/dL (ref 8.9–10.3)
Chloride: 99 mmol/L (ref 98–111)
Creatinine, Ser: 4.46 mg/dL — ABNORMAL HIGH (ref 0.44–1.00)
GFR calc Af Amer: 10 mL/min — ABNORMAL LOW (ref >60)
GFR calc non Af Amer: 9 mL/min — ABNORMAL LOW (ref >60)
Glucose, Bld: 115 mg/dL — ABNORMAL HIGH (ref 70–99)
Phosphorus: 6.1 mg/dL — ABNORMAL HIGH (ref 2.5–4.6)
Potassium: 4 mmol/L (ref 3.5–5.1)
Sodium: 133 mmol/L — ABNORMAL LOW (ref 135–145)

## 2019-06-15 MED ORDER — PANTOPRAZOLE SODIUM 40 MG PO TBEC
40.0000 mg | DELAYED_RELEASE_TABLET | Freq: Every day | ORAL | Status: DC
  Administered 2019-06-16 – 2019-06-30 (×15): 40 mg via ORAL
  Filled 2019-06-15 (×15): qty 1

## 2019-06-15 MED ORDER — FERROUS SULFATE 325 (65 FE) MG PO TABS
325.0000 mg | ORAL_TABLET | Freq: Every day | ORAL | Status: DC
  Administered 2019-06-17 – 2019-06-29 (×13): 325 mg via ORAL
  Filled 2019-06-15 (×13): qty 1

## 2019-06-15 NOTE — Progress Notes (Signed)
Nursing staff attempted to get pt up to walk per pt request. Pt was able to get to bedside commode with two person assist and walker. This mobility was very limited due to foot pain and pt anxiety. She stated no one had tried to get her up to walk today though all notes contradict her statement. rn encouraged pt to work more with therapies to be able to go home and be more independent. She states she was able to walk fine at home prior to coming in. rn to call family soon to update them on pt overall condition.

## 2019-06-15 NOTE — Progress Notes (Signed)
OT Cancellation Note  Patient Details Name: BONETA STANDRE MRN: [id number] DOB: 06-07-1946   Cancelled Treatment:    Reason Eval/Treat Not Completed: Other (comment). Attempted tx. Pt did not want to get up to [facility]; stated she had been up twice with no results.  Helped RN roll her for bedpan to be placed (min A). Will check back another day.  Graison Leinberger 06/15/2019, 3:28 PM  Lesle Chris, OTR/L Acute Rehabilitation Services [phone] [facility] pager [phone] office 06/15/2019

## 2019-06-15 NOTE — Progress Notes (Signed)
Family (Deondrea Markos dtr in [employer]) called concerning pt plan for d/c. Pt and family are concerned that they will need equipment and home care. Pt very weak with limited mobility. Family would like a call in am to discuss plan of care and potential d/c plan. Pt is concerned she will have to leave tomorrow with limited resources. Pt remains stable overall with no needs.

## 2019-06-15 NOTE — Plan of Care (Signed)
  Problem: Pain Managment: Goal: General experience of comfort will improve Outcome: Progressing   Problem: Safety: Goal: Ability to remain free from injury will improve Outcome: Progressing   Problem: Skin Integrity: Goal: Risk for impaired skin integrity will decrease Outcome: Progressing   Problem: Elimination: Goal: Will not experience complications related to bowel motility Outcome: Progressing

## 2019-06-15 NOTE — Progress Notes (Signed)
PROGRESS NOTE  Erika Fry  UDJ:[id number]  DOB: 1946/09/27  DOA: 06/09/2019 PCP: [name], MD   Brief Admission Hx: 73 y.o. female with medical history significant of hypertension, hyperlipidemia, diet-controlled diabetes, GERD, hypothyroidism, gout, pacemaker placement, iron deficiency anemia, CKD-4, CHF, who presents with bilateral leg edema.  MDM/Assessment & Plan:   1. Recurrent bilateral leg edema - LE dopplers neg for DVT.  Secondary to acute on chronic fluid retention from diastolic CHF.  Treated with IV diuresis and elevation of extremities.  She is clinically improving with IV diuresis.  Her legs are improving.  Hold IV lasix due to bump in creatinine.  2. Cellulitis LEs - mild - Treated with IV ceftriaxone x 5 doses.  This is improving.   3. Wounds BLEs - bullous lesions and scaling of skin - consult to [facility] nurse.  Legs are wrapped now.   4. DM type 2 - diet controlled.  5. GERD - continue protonix for GI protection.  6. Hyperlipidemia - continue simvastatin.  7. AKI on Stage 4 CKD - creatinine slightly bumped today with IV diuresis, hold IV lasix today. Recheck BMP in AM.  8. UTI - Treated with IV ceftriaxone.  Urine culture no significant growth.  9. Hypothyroidism - continue levothyroxine.  10. Chronic diastolic CHF with mild exacerbation - this has been treated with IV diuresis.   11. Diabetic neuropathy (painful) - Pt has allodynia of both feet, treat symptoms.  I have increased frequency of her pain medications as her oxycodone pain relief and it seems to be working better.   Pt and family requested palliative consult for end of life pain management, goals of care and home hospice.  Palliative has seen patient and after extensive discussions pt will go home with hospice services.    12. Constipation - Continue miralax BID, colace BID, add nightly senna.   DVT prophylaxis: heparin SQ Code Status: Full   Family Communication: spoke with Anderson Malta / telephone   Disposition Plan: possible DC 7/15     Consultants:  Palliative medicine  Procedures:    Antimicrobials:  Ceftriaxone 7/9 >   Subjective: Pt says pain better controlled today.         Objective: Vitals:   06/14/19 2040 06/15/19 0500 06/15/19 0504 06/15/19 1445  BP: 111/67  106/68 127/70  Pulse: 73  69 72  Resp: 18  18 14   Temp: 98.9 F (37.2 C)  98.5 F (36.9 C) 98.2 F (36.8 C)  TempSrc: Oral  Oral Oral  SpO2: 97%  99% 100%  Weight:  68.3 kg    Height:        Intake/Output Summary (Last 24 hours) at 06/15/2019 1501 Last data filed at 06/15/2019 1030 Gross per 24 hour  Intake 240 ml  Output 1415 ml  Net -1175 ml   Filed Weights   06/13/19 0500 06/14/19 0608 06/15/19 0500  Weight: 64.6 kg 68.4 kg 68.3 kg   REVIEW OF SYSTEMS  As per history otherwise all reviewed and reported negative  Exam:  General exam: awake, alert, NAD, cooperative.  Respiratory system: Clear. No increased work of breathing. Cardiovascular system: S1 & S2 heard. No JVD, murmurs, gallops, clicks or pedal edema. Gastrointestinal system: Abdomen is nondistended, soft and nontender. Normal bowel sounds heard. Central nervous system: Alert and oriented. No focal neurological deficits. Extremities: improving edema bilateral LEs, allodynia bilateral feet unchanged.  Data Reviewed: Basic Metabolic Panel: Recent Labs  Lab 06/09/19 1717 06/09/19 2209 06/11/19 5885 06/12/19 0277 06/13/19 0801  06/15/19 0539  NA 133*  --  135 135 134* 133*  K 4.1  --  3.7 3.7 3.9 4.0  CL 98  --  100 100 98 99  CO2 20*  --  21* 23 20* 19*  GLUCOSE 102*  --  99 133* 103* 115*  BUN 97*  --  79* 69* 64* 73*  CREATININE 4.07* 4.01* 3.63* 3.65* 3.59* 4.46*  CALCIUM 9.8  --  9.3 9.4 9.6 9.5  MG  --   --  1.4* 1.9 1.9  --   PHOS  --   --  4.1 3.8 4.6 6.1*   Liver Function Tests: Recent Labs  Lab 06/11/19 0742 06/12/19 0934 06/13/19 0801 06/15/19 0539  ALBUMIN 3.1* 3.1* 3.1* 3.0*   No results for  input(s): LIPASE, AMYLASE in the last 168 hours. No results for input(s): AMMONIA in the last 168 hours. CBC: Recent Labs  Lab 06/09/19 1717 06/11/19 0742 06/12/19 0934 06/13/19 0801  WBC 9.0 6.4 7.2 8.1  NEUTROABS 6.2 4.3  --   --   HGB 9.9* 9.5* 9.6* 9.5*  HCT 30.3* 29.0* 30.5* 29.9*  MCV 90.2 92.4 94.1 93.7  PLT 239 216 244 217   Cardiac Enzymes: No results for input(s): CKTOTAL, CKMB, CKMBINDEX, TROPONINI in the last 168 hours. CBG (last 3)  No results for input(s): GLUCAP in the last 72 hours. Recent Results (from the past 240 hour(s))  Urine culture     Status: Abnormal   Collection Time: 06/09/19  5:17 PM   Specimen: Urine, Clean Catch  Result Value Ref Range Status   Specimen Description   Final    URINE, CLEAN CATCH Performed at [facility], [place] [address], [place], [place] [address]    Special Requests   Final    NONE Performed at [facility], [place] [address], [place], [place] [address]    Culture MULTIPLE SPECIES PRESENT, SUGGEST RECOLLECTION (A)  Final   Report Status 06/10/2019 FINAL  Final  SARS Coronavirus 2 (CEPHEID - Performed in [place] hospital lab), Hosp Order     Status: None   Collection Time: 06/09/19  5:17 PM   Specimen: Nasopharyngeal Swab  Result Value Ref Range Status   SARS Coronavirus 2 NEGATIVE NEGATIVE Final    Comment: (NOTE) If result is NEGATIVE SARS-CoV-2 target nucleic acids are NOT DETECTED. The SARS-CoV-2 RNA is generally detectable in upper and lower  respiratory specimens during the acute phase of infection. The lowest  concentration of SARS-CoV-2 viral copies this assay can detect is 250  copies / mL. A negative result does not preclude SARS-CoV-2 infection  and should not be used as the sole basis for treatment or other  patient management decisions.  A negative result may occur with  improper specimen collection / handling, submission of specimen other  than nasopharyngeal swab,  presence of viral mutation(s) within the  areas targeted by this assay, and inadequate number of viral copies  (<250 copies / mL). A negative result must be combined with clinical  observations, patient history, and epidemiological information. If result is POSITIVE SARS-CoV-2 target nucleic acids are DETECTED. The SARS-CoV-2 RNA is generally detectable in upper and lower  respiratory specimens dur ing the acute phase of infection.  Positive  results are indicative of active infection with SARS-CoV-2.  Clinical  correlation with patient history and other diagnostic information is  necessary to determine patient infection status.  Positive results do  not rule out bacterial infection  or co-infection with other viruses. If result is PRESUMPTIVE POSTIVE SARS-CoV-2 nucleic acids MAY BE PRESENT.   A presumptive positive result was obtained on the submitted specimen  and confirmed on repeat testing.  While 2019 novel coronavirus  (SARS-CoV-2) nucleic acids may be present in the submitted sample  additional confirmatory testing may be necessary for epidemiological  and / or clinical management purposes  to differentiate between  SARS-CoV-2 and other Sarbecovirus currently known to infect humans.  If clinically indicated additional testing with an alternate test  methodology [phone]) is advised. The SARS-CoV-2 RNA is generally  detectable in upper and lower respiratory sp ecimens during the acute  phase of infection. The expected result is Negative. Fact Sheet for Patients:  StrictlyIdeas.no Fact Sheet for Healthcare Providers: BankingDealers.co.za This test is not yet approved or cleared by the [place] FDA and has been authorized for detection and/or diagnosis of SARS-CoV-2 by FDA under an Emergency Use Authorization (EUA).  This EUA will remain in effect (meaning this test can be used) for the duration of the COVID-19 declaration under  Section 564(b)(1) of the Act, 21 U.S.C. section 360bbb-3(b)(1), unless the authorization is terminated or revoked sooner. Performed at [facility], [place] [address] [place], [place] [address]   Culture, blood (routine x 2)     Status: None   Collection Time: 06/09/19  5:41 PM   Specimen: BLOOD  Result Value Ref Range Status   Specimen Description   Final    BLOOD RIGHT ANTECUBITAL Performed at [place] [address] [place], [place] [address]    Special Requests   Final    BOTTLES DRAWN AEROBIC AND ANAEROBIC Blood Culture adequate volume Performed at [place] [address] [place], [place] [address]    Culture   Final    NO GROWTH 5 DAYS Performed at [facility], [place] [address] [place], [place] [address]    Report Status 06/14/2019 FINAL  Final  Culture, blood (routine x 2)     Status: None   Collection Time: 06/09/19  9:58 PM   Specimen: BLOOD  Result Value Ref Range Status   Specimen Description   Final    BLOOD BLOOD LEFT HAND Performed at [place] [address] [place], [place] [address]    Special Requests   Final    BOTTLES DRAWN AEROBIC ONLY Blood Culture adequate volume Performed at [place] [address] [place], [place] [address]    Culture   Final    NO GROWTH 5 DAYS Performed at [facility], [place] [address], [place], [place] [address]    Report Status 06/14/2019 FINAL  Final     Studies: No results found. Scheduled Meds: . docusate sodium  100 mg Oral BID  . ferrous sulfate  325 mg Oral BID WC  . gabapentin  100 mg Oral TID  . Gerhardt's butt cream   Topical Daily  . heparin  5,000 Units Subcutaneous Q8H  . levothyroxine  75 mcg Oral QAC breakfast  . loratadine  10 mg Oral Daily  . multivitamin with minerals  1 tablet Oral Daily  . pantoprazole  40 mg Oral BID  . polyethylene glycol  17 g Oral BID  . senna  1 tablet  Oral QHS  . simvastatin  40 mg Oral QHS   Continuous Infusions: . sodium chloride      Principal Problem:   Bilateral leg edema  Active Problems:   Chronic kidney disease (CKD), stage IV (severe) (HCC)   Diet-controlled diabetes mellitus (HCC)   GERD (gastroesophageal reflux disease)   High cholesterol   Hypothyroidism   Iron deficiency anemia   Essential hypertension   UTI (urinary tract infection)   Chronic diastolic CHF (congestive heart failure) (HCC)   Cellulitis of lower extremity   Acute on chronic diastolic CHF (congestive heart failure) ([place])   Hypomagnesemia   DNR (do not resuscitate) discussion   Cystitis  Time spent:   Irwin Brakeman, MD [facility] 06/15/2019, 3:01 PM    LOS: 5 days  How to contact the [facility] Attending or Consulting provider [place] or covering provider during after hours [place], for this patient?  1. Check the care team in [facility] and look for a) attending/consulting TRH provider listed and b) the [facility] team listed 2. Log into [email or web address] and use [place]'s universal password to access. If you do not have the password, please contact the hospital operator. 3. Locate the [facility] provider you are looking for under [facility] and page to a number that you can be directly reached. 4. If you still have difficulty reaching the provider, please page the [facility] (Director on Call) for the Hospitalists listed on amion for assistance.

## 2019-06-15 NOTE — Progress Notes (Signed)
rn attempted to call pt family Anderson Malta (dtr in [employer] [place]) to update but this was not successful. rn left message for family to call back.

## 2019-06-16 DIAGNOSIS — M79604 Pain in right leg: Secondary | ICD-10-CM

## 2019-06-16 DIAGNOSIS — M79605 Pain in left leg: Secondary | ICD-10-CM

## 2019-06-16 NOTE — Progress Notes (Signed)
Pt refused morning lab draws after lab could not get her in one stick. She stated she would only let them draw labs every other day.

## 2019-06-16 NOTE — Progress Notes (Addendum)
Physical Therapy Treatment Patient Details Name: Erika Fry MRN: [id number] DOB: 10-19-1946 Today's Date: 06/16/2019    History of Present Illness 73 y.o. female with medical history significant of hypertension, hyperlipidemia, diet-controlled diabetes, GERD, hypothyroidism, gout, pacemaker placement, iron deficiency anemia, CKD-4, CHF, peripheral neuropathy, and recent R nephrostomy drain placed 04/2019 who presents with bilateral leg edema.    PT Comments    Progressing slowly with mobility.    Follow Up Recommendations  Home health PT; Intermittent Supervision (may need a home health aide)     Equipment Recommendations  None recommended by PT    Recommendations for Other Services       Precautions / Restrictions Precautions Precautions: Fall Precaution Comments: large fluid filled blister on bil LEs, LEs very tender to touch Restrictions Weight Bearing Restrictions: No    Mobility  Bed Mobility Overal bed mobility: Needs Assistance Bed Mobility: Supine to Sit;Sit to Supine     Supine to sit: Min guard;HOB elevated Sit to supine: Min guard;HOB elevated   General bed mobility comments: Increased time.  Transfers Overall transfer level: Needs assistance Equipment used: Rolling walker (2 wheeled) Transfers: Sit to/from Stand Sit to Stand: Min assist;From elevated surface         General transfer comment: assist to stand and steady  Ambulation/Gait Ambulation/Gait assistance: Min assist Gait Distance (Feet): 10 Feet Assistive device: Rolling walker (2 wheeled) Gait Pattern/deviations: Step-through pattern;Decreased stride length;Decreased step length - right;Decreased step length - left     General Gait Details: slow, effortful gait pattern. Assist to steady throughout.   Stairs             Wheelchair Mobility    Modified Rankin (Stroke Patients Only)       Balance Overall balance assessment: Needs assistance         Standing  balance support: Bilateral upper extremity supported Standing balance-Leahy Scale: Poor                              Cognition Arousal/Alertness: Awake/alert Behavior During Therapy: WFL for tasks assessed/performed Overall Cognitive Status: Within Functional Limits for tasks assessed                                        Exercises      General Comments General comments (skin integrity, edema, etc.): pt lightheaded. BP 116/53      Pertinent Vitals/Pain Pain Assessment: Faces Faces Pain Scale: Hurts whole lot Pain Location: groin and bil LEs Pain Descriptors / Indicators: Aching;Numbness Pain Intervention(s): Limited activity within patient's tolerance;Monitored during session;Repositioned    Home Living                      Prior Function            PT Goals (current goals can now be found in the care plan section) Progress towards PT goals: Progressing toward goals    Frequency    Min 3X/week      PT Plan Current plan remains appropriate    Co-evaluation              AM-PAC PT "6 Clicks" Mobility   Outcome Measure  Help needed turning from your back to your side while in a flat bed without using bedrails?: A Little Help needed moving from lying on your back to  sitting on the side of a flat bed without using bedrails?: A Little Help needed moving to and from a bed to a chair (including a wheelchair)?: A Little Help needed standing up from a chair using your arms (e.g., wheelchair or bedside chair)?: A Little Help needed to walk in hospital room?: A Little Help needed climbing 3-5 steps with a railing? : A Lot 6 Click Score: 17    End of Session   Activity Tolerance: Patient limited by pain Patient left: in bed;with call bell/phone within reach   PT Visit Diagnosis: Muscle weakness (generalized) (M62.81);Difficulty in walking, not elsewhere classified (R26.2);Pain Pain - part of body: Leg(groin)     Time:  1400-1416 PT Time Calculation (min) (ACUTE ONLY): 16 min  Charges:  $Gait Training: 8-22 mins                        Weston Anna, PT Acute Rehabilitation Services Pager: [phone] Office: [phone]

## 2019-06-16 NOTE — Progress Notes (Signed)
Patient ID: Erika Fry, female   DOB: 03/21/1946, 73 y.o.   MRN: [id number]  This NP visited patient at the bedside as a follow up to for palliative medicine needs and emotional support.  Patient remains weak and deconditioned.  I spoke to main spokesperson for the family her daughter-in-law/ Erika Fry as follow-up to yesterday's detailed family meeting.  Erika Fry verbalizes that "the girls" understand and have better insight into the overall medical and social situation then the patient's sons do.  They do not want to accept that the patient has multiple chronic medical conditions resulting in her continued physical and functional decline.     Plan of care: -Patient remains full code--encourage patient and family to consider DNR/DNI status knowing poor outcomes in similar patients -Patient and family are open to all offered and available medical interventions to prolong life, all remain hopeful for improvement -Transition home with home health and home rehab -Recommend outpatient community-based palliative care services -Family plans to implement care plan including family involvement in the home and out-of-pocket caregivers  Discussed with patient the importance of continued conversation with her family and their  medical providers regarding overall plan of care and treatment options,  ensuring decisions are within the context of the patients values and GOCs.  Patient is high risk for decompensation  Questions and concerns addressed   Discussed with Dr Wynetta Emery  Total time spent on the unit was 35 minutes  Greater than 50% of the time was spent in counseling and coordination of care  Wadie Lessen NP  Palliative Medicine Team Team Phone # [phone] Pager [phone]

## 2019-06-16 NOTE — TOC Initial Note (Addendum)
Transition of Care [facility]) - Initial/Assessment Note    Patient Details  Name: Erika Fry MRN: [id number] Date of Birth: Jan 04, 1946  [facility] CM/SW Contact:    [name], LCSW Phone Number: 06/16/2019, 12:59 PM  Clinical Narrative:    CSW spoke to patient at bedside about discharge pans. Patient stated she does not want SNF placement due to having a bad experience with one in the past. Patient stated she wants to go home with [facility] following since that's the agency she has had in the past. CSW spoke with Ronalee Belts and Ronalee Belts stated they will be able to follow patient again in the home. CSW asked if patient would be okay with having Palliative at home following for [place] and patient stated she does not understand why she needs them following if she has [place]. Patient stated she does not want Palliative following in the home. CSW will inform MD of possibly reeducating patient again about having resource available to her                 Expected Discharge Plan: [place] Barriers to Discharge: Continued Medical Work up   Patient Goals and CMS Choice Patient states their goals for this hospitalization and ongoing recovery are:: to go home CMS Medicare.gov Compare Post Acute Care list provided to:: Patient Choice offered to / list presented to : Patient(pt stated she wants to saty with her [facility] agency)  Expected Discharge Plan and Services Expected Discharge Plan: [place] In-house Referral: Clinical Social Work   Post Acute Care Choice: [place] arrangements for the past 2 months: [place]: [facility] Date [place]: 06/16/19 Time [place] Walk: 1255 Representative spoke with at [place]: [place] Arrangements/Services Living arrangements for the past 2 months: [place] with:: Self   Do  you feel safe going back to the place where you live?: Yes      Need for Family Participation in Patient Care: Yes (Comment) Care giver support system in place?: Yes (comment)   Criminal Activity/Legal Involvement Pertinent to Current Situation/Hospitalization: No - Comment as needed  Activities of Daily Living Home Assistive Devices/Equipment: Eyeglasses, Environmental consultant (specify type) ADL Screening (condition at time of admission) Patient's cognitive ability adequate to safely complete daily activities?: No Is the patient deaf or have difficulty hearing?: No Does the patient have difficulty seeing, even when wearing glasses/contacts?: No Does the patient have difficulty concentrating, remembering, or making decisions?: No Patient able to express need for assistance with ADLs?: Yes Does the patient have difficulty dressing or bathing?: Yes Independently performs ADLs?: No Does the patient have difficulty walking or climbing stairs?: Yes Weakness of Legs: Both Weakness of Arms/Hands: None  Permission Sought/Granted Permission sought to share information with : Family Supports Permission granted to share information with : Yes, Verbal Permission Granted  Share Information with NAME: Gaffer  Permission granted to share info w AGENCY: [place] granted to share info w Relationship: relative  Permission granted to share info w Contact Information: [phone]  Emotional Assessment Appearance:: Appears stated age Attitude/Demeanor/Rapport: Engaged Affect (typically observed): Accepting Orientation: : Oriented to Self, Oriented to Place, Oriented to Situation, Oriented to  Time  Alcohol / Substance Use: Not Applicable Psych Involvement: No (comment)  Admission diagnosis:  Leg pain Patient Active Problem List   Diagnosis Date Noted  . Pain in both lower extremities   . Hypomagnesemia 06/11/2019  . DNR (do not resuscitate) discussion   . Cystitis   . Acute on chronic  diastolic CHF (congestive heart failure) ([place]) 06/10/2019  . Chronic diastolic CHF (congestive heart failure) ([place]) 06/09/2019  . Cellulitis of lower extremity 06/09/2019  . Bladder pain   . Hyponatremia   . Acute on chronic anemia   . Diabetes mellitus type 2 in obese ([place])   . Chronic kidney disease   . Drug induced constipation   . Neurogenic bladder   . Debility 04/21/2019  . Acquired hydronephrosis with ureteropelvic junction (UPJ) obstruction   . Pain in joint involving pelvic region and thigh   . Dysuria   . Palliative care by specialist   . AKI (acute kidney injury) ([place]) 04/10/2019  . Hydronephrosis 04/09/2019  . Interstitial cystitis 04/09/2019  . Bilateral leg edema 02/25/2019  . Chronic heart failure with preserved ejection fraction ([place]) 02/24/2019  . Presence of permanent cardiac pacemaker 02/24/2019  . ARF (acute renal failure) ([place]) 02/10/2019  . Peripheral edema   . Fever 01/17/2019  . Acute lower UTI 01/17/2019  . Pressure injury of skin 01/17/2019  . Gross hematuria 01/14/2019  . Fluid retention   . Generalized edema   . Hypokalemia   . Acute right-sided low back pain with right-sided sciatica   . Acute blood loss anemia   . Anemia of chronic disease   . Thrombocytopenia ([place])   . Diabetes mellitus ([place])   . Morbid obesity ([place])   . Benign essential HTN   . Hypoalbuminemia due to protein-calorie malnutrition ([place])   . Occult blood in stools   . Gastric polyp   . Lumbar discitis 05/17/2018  . Blood loss anemia 05/12/2018  . Hyperlipidemia associated with type 2 diabetes mellitus ([place]) 05/10/2018  . UTI (urinary tract infection) 04/30/2018  . Essential hypertension 04/29/2018  . Chronic kidney disease (CKD), stage IV (severe) ([place])   . Diet-controlled diabetes mellitus ([place])   . GERD (gastroesophageal reflux disease)   . Gout   . High cholesterol   . Hypothyroidism   . Iron deficiency anemia   . AV block, Mobitz 2 03/25/2018  . Mobitz type 2  second degree AV block 03/20/2018  . Aortic stenosis 03/20/2018  . Exertional dyspnea 03/19/2018   PCP:  [name], MD Pharmacy:   [place], [place] [address] Phone: [phone] Fax: [phone]     Social Determinants of Health (SDOH) Interventions    Readmission Risk Interventions Readmission Risk Prevention Plan 06/15/2019 04/13/2019  Transportation Screening Complete Complete  Medication Review (RN Care Manager) Complete Complete  PCP or Specialist appointment within 3-5 days of discharge Not Complete Complete  PCP/Specialist Appt Not Complete comments not ready for dc -  HRI or [place] Complete Complete  SW Recovery Care/Counseling Consult Complete Complete  Palliative Care Screening Not Applicable Not [place] Not Applicable Patient Refused  Some recent data might be hidden

## 2019-06-16 NOTE — Progress Notes (Signed)
PROGRESS NOTE    Erika Fry  ZSW:[id number] DOB: Feb 26, 1946 DOA: 06/09/2019 PCP: [name], MD  Brief Narrative:  73 y.o.femalewith medical history significant ofhypertension, hyperlipidemia, diet-controlled diabetes, GERD, hypothyroidism, gout, pacemaker placement, iron deficiency anemia, CKD-4, CHF, who presents with bilateral leg edema.  Assessment & Plan:   Principal Problem:   Bilateral leg edema Active Problems:   Chronic kidney disease (CKD), stage IV (severe) (HCC)   Diet-controlled diabetes mellitus (HCC)   GERD (gastroesophageal reflux disease)   High cholesterol   Hypothyroidism   Iron deficiency anemia   Essential hypertension   UTI (urinary tract infection)   Chronic diastolic CHF (congestive heart failure) (HCC)   Cellulitis of lower extremity   Acute on chronic diastolic CHF (congestive heart failure) ([place])   Hypomagnesemia   DNR (do not resuscitate) discussion   Cystitis   Pain in both lower extremities   Recurrent bilateral leg edema with cellulitis of the lower extremities:  Completed the course of antibiotics.  Possible from diabetic neuropathy.  Probably a component of acute diastolic heart failure.  She was diuresed appropriately with IV lasix, but since her creatinine worsened with IV diuresis, lasix was held.  Repeat renal parameters are pending.     Type 2 dm: CBG (last 3)  No results for input(s): GLUCAP in the last 72 hours.  ? Diet controlled.    Acute on STAGE 4 CKD : Creatinine worsened with IV diuresis.  Holding lasix and repeat renal parameters pending.   Hypertension: Well controlled.    Hypothyroidism: Resume synthroid.    UTI:  Completed the course of antibiotics.    Constipation:  Resume miralax and colace.   GERD; Stable.       DVT prophylaxis: heparin.  Code Status: full code.  Family Communication: none at bedside.   Disposition Plan: pending PT evaluation.   Consultants:    Palliative care.   Procedures: none.  Antimicrobials:  None.   Subjective: Reports persistent leg pain, wants to work with PT.  Does not want to go home today.   Objective: Vitals:   06/15/19 0504 06/15/19 1445 06/15/19 2044 06/16/19 0622  BP: 106/68 127/70 (!) 142/70 (!) 105/51  Pulse: 69 72 80 72  Resp: 18 14 16 13   Temp: 98.5 F (36.9 C) 98.2 F (36.8 C) 99.3 F (37.4 C) 98.3 F (36.8 C)  TempSrc: Oral Oral Oral Oral  SpO2: 99% 100% 100% 100%  Weight:    65.2 kg  Height:        Intake/Output Summary (Last 24 hours) at 06/16/2019 1527 Last data filed at 06/16/2019 1300 Gross per 24 hour  Intake 1200 ml  Output 2025 ml  Net -825 ml   Filed Weights   06/14/19 0608 06/15/19 0500 06/16/19 0622  Weight: 68.4 kg 68.3 kg 65.2 kg    Examination:  General exam: Appears calm and comfortable  Respiratory system: Clear to auscultation. Respiratory effort normal. Cardiovascular system: S1 & S2 heard, RRR. No JVD,  Gastrointestinal system: Abdomen is nondistended, soft and nontender. No organomegaly or masses felt. Normal bowel sounds heard. Central nervous system: Alert and oriented. No focal neurological deficits. Extremities: tender lower extremities, legs bandaged.  Skin: No rashes, lesions or ulcers Psychiatry:Mood & affect appropriate.     Data Reviewed: I have personally reviewed following labs and imaging studies  CBC: Recent Labs  Lab 06/09/19 1717 06/11/19 0742 06/12/19 0934 06/13/19 0801  WBC 9.0 6.4 7.2 8.1  NEUTROABS 6.2 4.3  --   --  HGB 9.9* 9.5* 9.6* 9.5*  HCT 30.3* 29.0* 30.5* 29.9*  MCV 90.2 92.4 94.1 93.7  PLT 239 216 244 409   Basic Metabolic Panel: Recent Labs  Lab 06/09/19 1717 06/09/19 2209 06/11/19 0742 06/12/19 0934 06/13/19 0801 06/15/19 0539  NA 133*  --  135 135 134* 133*  K 4.1  --  3.7 3.7 3.9 4.0  CL 98  --  100 100 98 99  CO2 20*  --  21* 23 20* 19*  GLUCOSE 102*  --  99 133* 103* 115*  BUN 97*  --  79* 69* 64*  73*  CREATININE 4.07* 4.01* 3.63* 3.65* 3.59* 4.46*  CALCIUM 9.8  --  9.3 9.4 9.6 9.5  MG  --   --  1.4* 1.9 1.9  --   PHOS  --   --  4.1 3.8 4.6 6.1*   GFR: Estimated Creatinine Clearance: 9.8 mL/min (A) (by C-G formula based on SCr of 4.46 mg/dL (H)). Liver Function Tests: Recent Labs  Lab 06/11/19 0742 06/12/19 0934 06/13/19 0801 06/15/19 0539  ALBUMIN 3.1* 3.1* 3.1* 3.0*   No results for input(s): LIPASE, AMYLASE in the last 168 hours. No results for input(s): AMMONIA in the last 168 hours. Coagulation Profile: No results for input(s): INR, PROTIME in the last 168 hours. Cardiac Enzymes: No results for input(s): CKTOTAL, CKMB, CKMBINDEX, TROPONINI in the last 168 hours. BNP (last 3 results) No results for input(s): PROBNP in the last 8760 hours. HbA1C: No results for input(s): HGBA1C in the last 72 hours. CBG: Recent Labs  Lab 06/10/19 0817 06/10/19 1232 06/10/19 1609  GLUCAP 91 117* 133*   Lipid Profile: No results for input(s): CHOL, HDL, LDLCALC, TRIG, CHOLHDL, LDLDIRECT in the last 72 hours. Thyroid Function Tests: No results for input(s): TSH, T4TOTAL, FREET4, T3FREE, THYROIDAB in the last 72 hours. Anemia Panel: No results for input(s): VITAMINB12, FOLATE, FERRITIN, TIBC, IRON, RETICCTPCT in the last 72 hours. Sepsis Labs: Recent Labs  Lab 06/09/19 2158 06/09/19 2209  PROCALCITON  --  0.44  LATICACIDVEN 1.6  --     Recent Results (from the past 240 hour(s))  Urine culture     Status: Abnormal   Collection Time: 06/09/19  5:17 PM   Specimen: Urine, Clean Catch  Result Value Ref Range Status   Specimen Description   Final    URINE, CLEAN CATCH Performed at [facility], [place] [address] [place], [place] [address]    Special Requests   Final    NONE Performed at [facility], [place] [address], [place], [place] [address]    Culture MULTIPLE SPECIES PRESENT, SUGGEST RECOLLECTION (A)  Final   Report Status  06/10/2019 FINAL  Final  SARS Coronavirus 2 (CEPHEID - Performed in [place] hospital lab), Hosp Order     Status: None   Collection Time: 06/09/19  5:17 PM   Specimen: Nasopharyngeal Swab  Result Value Ref Range Status   SARS Coronavirus 2 NEGATIVE NEGATIVE Final    Comment: (NOTE) If result is NEGATIVE SARS-CoV-2 target nucleic acids are NOT DETECTED. The SARS-CoV-2 RNA is generally detectable in upper and lower  respiratory specimens during the acute phase of infection. The lowest  concentration of SARS-CoV-2 viral copies this assay can detect is 250  copies / mL. A negative result does not preclude SARS-CoV-2 infection  and should not be used as the sole basis for treatment or other  patient management decisions.  A negative result may occur with  improper specimen collection / handling, submission of specimen other  than nasopharyngeal swab, presence of viral mutation(s) within the  areas targeted by this assay, and inadequate number of viral copies  (<250 copies / mL). A negative result must be combined with clinical  observations, patient history, and epidemiological information. If result is POSITIVE SARS-CoV-2 target nucleic acids are DETECTED. The SARS-CoV-2 RNA is generally detectable in upper and lower  respiratory specimens dur ing the acute phase of infection.  Positive  results are indicative of active infection with SARS-CoV-2.  Clinical  correlation with patient history and other diagnostic information is  necessary to determine patient infection status.  Positive results do  not rule out bacterial infection or co-infection with other viruses. If result is PRESUMPTIVE POSTIVE SARS-CoV-2 nucleic acids MAY BE PRESENT.   A presumptive positive result was obtained on the submitted specimen  and confirmed on repeat testing.  While 2019 novel coronavirus  (SARS-CoV-2) nucleic acids may be present in the submitted sample  additional confirmatory testing may be necessary  for epidemiological  and / or clinical management purposes  to differentiate between  SARS-CoV-2 and other Sarbecovirus currently known to infect humans.  If clinically indicated additional testing with an alternate test  methodology [phone]) is advised. The SARS-CoV-2 RNA is generally  detectable in upper and lower respiratory sp ecimens during the acute  phase of infection. The expected result is Negative. Fact Sheet for Patients:  StrictlyIdeas.no Fact Sheet for Healthcare Providers: BankingDealers.co.za This test is not yet approved or cleared by the [place] FDA and has been authorized for detection and/or diagnosis of SARS-CoV-2 by FDA under an Emergency Use Authorization (EUA).  This EUA will remain in effect (meaning this test can be used) for the duration of the COVID-19 declaration under Section 564(b)(1) of the Act, 21 U.S.C. section 360bbb-3(b)(1), unless the authorization is terminated or revoked sooner. Performed at [facility], [place] [address], [place], [place] [address]   Culture, blood (routine x 2)     Status: None   Collection Time: 06/09/19  5:41 PM   Specimen: BLOOD  Result Value Ref Range Status   Specimen Description   Final    BLOOD RIGHT ANTECUBITAL Performed at [place] [address], [place], [place] [address]    Special Requests   Final    BOTTLES DRAWN AEROBIC AND ANAEROBIC Blood Culture adequate volume Performed at [facility] [address] [place], [place] [address]    Culture   Final    NO GROWTH 5 DAYS Performed at [facility], [place] [address] [place], [place] [address]    Report Status 06/14/2019 FINAL  Final  Culture, blood (routine x 2)     Status: None   Collection Time: 06/09/19  9:58 PM   Specimen: BLOOD  Result Value Ref Range Status   Specimen Description   Final    BLOOD BLOOD LEFT HAND Performed at [facility] [address], [place], [place] [address]    Special Requests   Final    BOTTLES DRAWN AEROBIC ONLY Blood Culture adequate volume Performed at [place] [address], [place], [place] [address]    Culture   Final    NO GROWTH 5 DAYS Performed at [facility], [place] [address] [place],  [address]    Report Status 06/14/2019 FINAL  Final         Radiology Studies: No  results found.      Scheduled Meds: . docusate sodium  100 mg Oral BID  . [START ON 06/17/2019] ferrous sulfate  325 mg Oral Q breakfast  . gabapentin  100 mg Oral TID  . Gerhardt's butt cream   Topical Daily  . heparin  5,000 Units Subcutaneous Q8H  . levothyroxine  75 mcg Oral QAC breakfast  . loratadine  10 mg Oral Daily  . multivitamin with minerals  1 tablet Oral Daily  . pantoprazole  40 mg Oral Daily  . polyethylene glycol  17 g Oral BID  . senna  1 tablet Oral QHS  . simvastatin  40 mg Oral QHS   Continuous Infusions: . sodium chloride       LOS: 6 days    Time spent: 36 minutes.     Hosie Poisson, MD [facility] Pager [phone]  If 7PM-7AM, please contact night-coverage [email or web address] Password [facility] 06/16/2019, 3:27 PM

## 2019-06-16 NOTE — Progress Notes (Signed)
Occupational Therapy Treatment Patient Details Name: Erika Fry MRN: [id number] DOB: 11-28-1946 Today's Date: 06/16/2019    History of present illness 73 y.o. female with medical history significant of hypertension, hyperlipidemia, diet-controlled diabetes, GERD, hypothyroidism, gout, pacemaker placement, iron deficiency anemia, CKD-4, CHF, peripheral neuropathy, and recent R nephrostomy drain placed 04/2019 who presents with bilateral leg edema.   OT comments  Pt declining SNF. She needs to have someone with her for mobility and ADLs. Pt transferred with min A x 2 today, but she was unable to assist with hygiene or clothes management.  Pt very willing to participate and she worked on her level 2 theraband exercises.  Limited endurance   Follow Up Recommendations  Home health OT;Supervision/Assistance - 24 hour.  Home Health aide.  (pt would benefit from SNF; declines this option)    Equipment Recommendations  None recommended by OT    Recommendations for Other Services      Precautions / Restrictions Precautions Precautions: Fall Precaution Comments: large fluid filled blister on bil LEs, LEs very tender to touch Restrictions Weight Bearing Restrictions: No       Mobility Bed Mobility Overal bed mobility: Needs Assistance Bed Mobility: Supine to Sit;Sit to Supine     Supine to sit: Min guard;HOB elevated Sit to supine: Min guard;HOB elevated   General bed mobility comments: Increased time.  Transfers Overall transfer level: Needs assistance Equipment used: Rolling walker (2 wheeled) Transfers: Sit to/from Stand Sit to Stand: Min assist;From elevated surface         General transfer comment: assist to stand and steady    Balance Overall balance assessment: Needs assistance         Standing balance support: Bilateral upper extremity supported Standing balance-Leahy Scale: Poor                             ADL either performed or assessed  with clinical judgement   ADL Overall ADL's : Needs assistance/impaired                         Toilet Transfer: Minimal assistance;Stand-pivot;BSC;RW   Toileting- Clothing Manipulation and Hygiene: Total assistance;Sit to/from stand         General ADL Comments: performed 2 SPT:  to 3;1 then chair.  Reclined for comfort     Vision       Perception     Praxis      Cognition Arousal/Alertness: Awake/alert Behavior During Therapy: WFL for tasks assessed/performed Overall Cognitive Status: Within Functional Limits for tasks assessed                                          Exercises     Shoulder Instructions       General Comments pt lightheaded. BP 116/53    Pertinent Vitals/ Pain       Pain Assessment: Faces Faces Pain Scale: Hurts whole lot Pain Location: groin and bil LEs Pain Descriptors / Indicators: Aching;Numbness Pain Intervention(s): Limited activity within patient's tolerance;Monitored during session;Repositioned  Home Living                                          Prior Functioning/Environment  Frequency  Min 2X/week        Progress Toward Goals  OT Goals(current goals can now be found in the care plan section)  Progress towards OT goals: Progressing toward goals     Plan      Co-evaluation                 AM-PAC OT "6 Clicks" Daily Activity     Outcome Measure   Help from another person eating meals?: None Help from another person taking care of personal grooming?: A Little Help from another person toileting, which includes using toliet, bedpan, or urinal?: Total Help from another person bathing (including washing, rinsing, drying)?: A Lot Help from another person to put on and taking off regular upper body clothing?: A Little Help from another person to put on and taking off regular lower body clothing?: Total 6 Click Score: 14    End of Session    OT Visit  Diagnosis: Pain Pain - part of body: Leg   Activity Tolerance Patient tolerated treatment well   Patient Left in chair;with call bell/phone within reach;with chair alarm set   Nurse Communication          Time: 2194-7125 OT Time Calculation (min): 35 min  Charges: OT General Charges $OT Visit: 1 Visit OT Treatments $Self Care/Home Management : 8-22 mins $Therapeutic Exercise: 8-22 mins  Lesle Chris, OTR/L Acute Rehabilitation Services [phone] [facility] pager [phone] office 06/16/2019   [place] 06/16/2019, 3:06 PM

## 2019-06-16 NOTE — Progress Notes (Signed)
Patient ID: Erika Fry, female   DOB: 1946-04-18, 73 y.o.   MRN: [id number]  This NP visited patient at the bedside as a follow up to for palliative medicine needs and emotional support.  Patient remains weak and deconditioned, she is out of bed to the chair.  Discussed with patient the importance of self motivated exercise and mobility.  Again we discussed anticipatory care needs when patient is discharged.   Although the patient verbalizes her concern that she is not ready to go home, "I am too weak I can even walk on my own".  Patient is refusing any kind of short-term rehab and the plan is for her to go home with home health.   As discussed with family a few days ago they plan to augment care with out-of-pocket caregivers and for family to be more present "if the patient agrees"  Recommend outpatient community-based palliative care to follow on discharge.  Plan of care: -Patient remains full code--encourage patient and family to consider DNR/DNI status knowing poor outcomes in similar patients -Patient and family are open to all offered and available medical interventions to prolong life, all remain hopeful for improvement -Transition home with home health and home rehab -Recommend outpatient community-based palliative care services -Family plans to implement care plan including family involvement in the home and out-of-pocket caregivers  Discussed with patient the importance of continued conversation with her family and their  medical providers regarding overall plan of care and treatment options,  ensuring decisions are within the context of the patients values and GOCs.  Patient is high risk for decompensation  Questions and concerns addressed   Discussed with Dr Karleen Hampshire  Total time spent on the unit was 25 minutes  Greater than 50% of the time was spent in counseling and coordination of care  Wadie Lessen NP  Palliative Medicine Team Team Phone # [phone] Pager  [phone]

## 2019-06-17 LAB — BASIC METABOLIC PANEL
Anion gap: 14 (ref 5–15)
BUN: 84 mg/dL — ABNORMAL HIGH (ref 8–23)
CO2: 21 mmol/L — ABNORMAL LOW (ref 22–32)
Calcium: 9.7 mg/dL (ref 8.9–10.3)
Chloride: 96 mmol/L — ABNORMAL LOW (ref 98–111)
Creatinine, Ser: 4.98 mg/dL — ABNORMAL HIGH (ref 0.44–1.00)
GFR calc Af Amer: 9 mL/min — ABNORMAL LOW (ref >60)
GFR calc non Af Amer: 8 mL/min — ABNORMAL LOW (ref >60)
Glucose, Bld: 103 mg/dL — ABNORMAL HIGH (ref 70–99)
Potassium: 4.7 mmol/L (ref 3.5–5.1)
Sodium: 131 mmol/L — ABNORMAL LOW (ref 135–145)

## 2019-06-17 LAB — URINALYSIS, ROUTINE W REFLEX MICROSCOPIC
Bilirubin Urine: NEGATIVE
Glucose, UA: NEGATIVE mg/dL
Ketones, ur: NEGATIVE mg/dL
Nitrite: NEGATIVE
Protein, ur: 100 mg/dL — AB
Specific Gravity, Urine: 1.01 (ref 1.005–1.030)
WBC, UA: >50 WBC/hpf — ABNORMAL HIGH (ref 0–5)
pH: 6 (ref 5.0–8.0)

## 2019-06-17 LAB — SODIUM, URINE, RANDOM: Sodium, Ur: 32 mmol/L

## 2019-06-17 LAB — CREATININE, URINE, RANDOM: Creatinine, Urine: 54.64 mg/dL

## 2019-06-17 MED ORDER — DICLOFENAC SODIUM 1 % TD GEL
2.0000 g | Freq: Four times a day (QID) | TRANSDERMAL | Status: DC | PRN
  Administered 2019-06-17 – 2019-06-22 (×3): 2 g via TOPICAL
  Filled 2019-06-17: qty 100

## 2019-06-17 NOTE — Plan of Care (Signed)
Patient continues to c/o pain in neck and legs, receiving pain meds q4 hours, Voltaren gel and heating pad to neck with some improvement.  Patient up to [facility] multiple time this shift, walked in hallway twice with walker and standby assist.  BM x 2 this shift.

## 2019-06-17 NOTE — Progress Notes (Signed)
PROGRESS NOTE    DEAH OTTAWAY  XVQ:[id number] DOB: Jan 04, 1946 DOA: 06/09/2019 PCP: [name], MD  Brief Narrative:  73 y.o.femalewith medical history significant ofhypertension, hyperlipidemia, diet-controlled diabetes, GERD, hypothyroidism, gout, pacemaker placement, iron deficiency anemia, CKD-4, CHF, who presents with bilateral leg edema.  Assessment & Plan:   Principal Problem:   Bilateral leg edema Active Problems:   Chronic kidney disease (CKD), stage IV (severe) (HCC)   Diet-controlled diabetes mellitus (HCC)   GERD (gastroesophageal reflux disease)   High cholesterol   Hypothyroidism   Iron deficiency anemia   Essential hypertension   UTI (urinary tract infection)   Weakness generalized   Chronic diastolic CHF (congestive heart failure) (HCC)   Cellulitis of lower extremity   Acute on chronic diastolic CHF (congestive heart failure) ([place])   Hypomagnesemia   DNR (do not resuscitate) discussion   Cystitis   Pain in both lower extremities   Recurrent bilateral leg edema with cellulitis of the lower extremities:  Completed the course of antibiotics.  Possibly from diabetic neuropathy.  Probably a component of acute diastolic heart failure.  She was diuresed appropriately with IV lasix, but since her creatinine worsened with IV diuresis, lasix was held.  Repeat renal parameters show worsening of creatinine. Appreciate nephrology recommendations.     Type 2 dm: CBG (last 3)  No results for input(s): GLUCAP in the last 72 hours.  ? Diet controlled.    Acute on STAGE 4 CKD Creatinine worsened with IV diuresis.  Holding lasix and repeat renal parameters in am.   Hypertension: Well controlled.    Hypothyroidism: Resume synthroid.    UTI:  Completed the course of antibiotics.    Constipation:  Resume miralax and colace.   GERD; Stable.       DVT prophylaxis: heparin.  Code Status: full code.  Family Communication: none at  bedside.called son and updated.    Disposition Plan: plan to d/c home in 1 to 2 days, once her creatinine improves.   Consultants:   Palliative care.   Nephrology   Procedures: none.   Antimicrobials:  None.   Subjective: Leg pain improved. She appears in good spirits today.    Objective: Vitals:   06/16/19 1500 06/16/19 2201 06/17/19 0437 06/17/19 1300  BP: 129/87 (!) 104/54 113/69 121/73  Pulse: 78 78 75 81  Resp: 16 16 12 17   Temp: 98.5 F (36.9 C) 99 F (37.2 C) 98.5 F (36.9 C) 98.1 F (36.7 C)  TempSrc: Oral Oral Oral Oral  SpO2: 100% 99% 97% 99%  Weight:   66.1 kg   Height:        Intake/Output Summary (Last 24 hours) at 06/17/2019 1515 Last data filed at 06/17/2019 0900 Gross per 24 hour  Intake 240 ml  Output 825 ml  Net -585 ml   Filed Weights   06/15/19 0500 06/16/19 0622 06/17/19 0437  Weight: 68.3 kg 65.2 kg 66.1 kg    Examination:  General exam: Appears calm and comfortable  Respiratory system: Clear to auscultation. Respiratory effort normal. Cardiovascular system: S1 & S2 heard, RRR. No JVD,  Gastrointestinal system: Abdomen is nondistended, soft and nontender. No organomegaly or masses felt. Normal bowel sounds heard. Central nervous system: Alert and oriented. No focal neurological deficits. Extremities: tender lower extremities, legs bandaged.  Skin: No rashes, lesions or ulcers Psychiatry:Mood & affect appropriate.     Data Reviewed: I have personally reviewed following labs and imaging studies  CBC: Recent Labs  Lab 06/11/19 0742 06/12/19  0177 06/13/19 0801  WBC 6.4 7.2 8.1  NEUTROABS 4.3  --   --   HGB 9.5* 9.6* 9.5*  HCT 29.0* 30.5* 29.9*  MCV 92.4 94.1 93.7  PLT 216 244 939   Basic Metabolic Panel: Recent Labs  Lab 06/11/19 0742 06/12/19 0934 06/13/19 0801 06/15/19 0539 06/17/19 0415  NA 135 135 134* 133* 131*  K 3.7 3.7 3.9 4.0 4.7  CL 100 100 98 99 96*  CO2 21* 23 20* 19* 21*  GLUCOSE 99 133* 103* 115*  103*  BUN 79* 69* 64* 73* 84*  CREATININE 3.63* 3.65* 3.59* 4.46* 4.98*  CALCIUM 9.3 9.4 9.6 9.5 9.7  MG 1.4* 1.9 1.9  --   --   PHOS 4.1 3.8 4.6 6.1*  --    GFR: Estimated Creatinine Clearance: 8.8 mL/min (A) (by C-G formula based on SCr of 4.98 mg/dL (H)). Liver Function Tests: Recent Labs  Lab 06/11/19 0742 06/12/19 0934 06/13/19 0801 06/15/19 0539  ALBUMIN 3.1* 3.1* 3.1* 3.0*   No results for input(s): LIPASE, AMYLASE in the last 168 hours. No results for input(s): AMMONIA in the last 168 hours. Coagulation Profile: No results for input(s): INR, PROTIME in the last 168 hours. Cardiac Enzymes: No results for input(s): CKTOTAL, CKMB, CKMBINDEX, TROPONINI in the last 168 hours. BNP (last 3 results) No results for input(s): PROBNP in the last 8760 hours. HbA1C: No results for input(s): HGBA1C in the last 72 hours. CBG: Recent Labs  Lab 06/10/19 1609  GLUCAP 133*   Lipid Profile: No results for input(s): CHOL, HDL, LDLCALC, TRIG, CHOLHDL, LDLDIRECT in the last 72 hours. Thyroid Function Tests: No results for input(s): TSH, T4TOTAL, FREET4, T3FREE, THYROIDAB in the last 72 hours. Anemia Panel: No results for input(s): VITAMINB12, FOLATE, FERRITIN, TIBC, IRON, RETICCTPCT in the last 72 hours. Sepsis Labs: No results for input(s): PROCALCITON, LATICACIDVEN in the last 168 hours.  Recent Results (from the past 240 hour(s))  Urine culture     Status: Abnormal   Collection Time: 06/09/19  5:17 PM   Specimen: Urine, Clean Catch  Result Value Ref Range Status   Specimen Description   Final    URINE, CLEAN CATCH Performed at [facility], [place] [address] [place], [place] [address]    Special Requests   Final    NONE Performed at [facility], [place] [address] [place], [place] [address]    Culture MULTIPLE SPECIES PRESENT, SUGGEST RECOLLECTION (A)  Final   Report Status 06/10/2019 FINAL  Final  SARS Coronavirus 2 (CEPHEID - Performed  in [place] hospital lab), Hosp Order     Status: None   Collection Time: 06/09/19  5:17 PM   Specimen: Nasopharyngeal Swab  Result Value Ref Range Status   SARS Coronavirus 2 NEGATIVE NEGATIVE Final    Comment: (NOTE) If result is NEGATIVE SARS-CoV-2 target nucleic acids are NOT DETECTED. The SARS-CoV-2 RNA is generally detectable in upper and lower  respiratory specimens during the acute phase of infection. The lowest  concentration of SARS-CoV-2 viral copies this assay can detect is 250  copies / mL. A negative result does not preclude SARS-CoV-2 infection  and should not be used as the sole basis for treatment or other  patient management decisions.  A negative result may occur with  improper specimen collection / handling, submission of specimen other  than nasopharyngeal swab, presence of viral mutation(s) within the  areas targeted by this assay, and inadequate number of viral copies  (<  250 copies / mL). A negative result must be combined with clinical  observations, patient history, and epidemiological information. If result is POSITIVE SARS-CoV-2 target nucleic acids are DETECTED. The SARS-CoV-2 RNA is generally detectable in upper and lower  respiratory specimens dur ing the acute phase of infection.  Positive  results are indicative of active infection with SARS-CoV-2.  Clinical  correlation with patient history and other diagnostic information is  necessary to determine patient infection status.  Positive results do  not rule out bacterial infection or co-infection with other viruses. If result is PRESUMPTIVE POSTIVE SARS-CoV-2 nucleic acids MAY BE PRESENT.   A presumptive positive result was obtained on the submitted specimen  and confirmed on repeat testing.  While 2019 novel coronavirus  (SARS-CoV-2) nucleic acids may be present in the submitted sample  additional confirmatory testing may be necessary for epidemiological  and / or clinical management purposes  to  differentiate between  SARS-CoV-2 and other Sarbecovirus currently known to infect humans.  If clinically indicated additional testing with an alternate test  methodology [phone]) is advised. The SARS-CoV-2 RNA is generally  detectable in upper and lower respiratory sp ecimens during the acute  phase of infection. The expected result is Negative. Fact Sheet for Patients:  StrictlyIdeas.no Fact Sheet for Healthcare Providers: BankingDealers.co.za This test is not yet approved or cleared by the [place] FDA and has been authorized for detection and/or diagnosis of SARS-CoV-2 by FDA under an Emergency Use Authorization (EUA).  This EUA will remain in effect (meaning this test can be used) for the duration of the COVID-19 declaration under Section 564(b)(1) of the Act, 21 U.S.C. section 360bbb-3(b)(1), unless the authorization is terminated or revoked sooner. Performed at [facility], [place] [address] [place], [place] [address]   Culture, blood (routine x 2)     Status: None   Collection Time: 06/09/19  5:41 PM   Specimen: BLOOD  Result Value Ref Range Status   Specimen Description   Final    BLOOD RIGHT ANTECUBITAL Performed at [place] [address], [place], [place] [address]    Special Requests   Final    BOTTLES DRAWN AEROBIC AND ANAEROBIC Blood Culture adequate volume Performed at [place] [address] [place], [place] [address]    Culture   Final    NO GROWTH 5 DAYS Performed at [facility], [place] [address] [place], [place] [address]    Report Status 06/14/2019 FINAL  Final  Culture, blood (routine x 2)     Status: None   Collection Time: 06/09/19  9:58 PM   Specimen: BLOOD  Result Value Ref Range Status   Specimen Description   Final    BLOOD BLOOD LEFT HAND Performed at [place] [address] [place], [place]  [address]    Special Requests   Final    BOTTLES DRAWN AEROBIC ONLY Blood Culture adequate volume Performed at [place] [address], [place], [place] [address]    Culture   Final    NO GROWTH 5 DAYS Performed at [facility], [place] [address], [place], [place] [address]    Report Status 06/14/2019 FINAL  Final         Radiology Studies: No results found.      Scheduled Meds: . docusate sodium  100 mg Oral BID  . ferrous sulfate  325 mg Oral Q breakfast  . gabapentin  100  mg Oral TID  . Gerhardt's butt cream   Topical Daily  . heparin  5,000 Units Subcutaneous Q8H  . levothyroxine  75 mcg Oral QAC breakfast  . loratadine  10 mg Oral Daily  . multivitamin with minerals  1 tablet Oral Daily  . pantoprazole  40 mg Oral Daily  . polyethylene glycol  17 g Oral BID  . senna  1 tablet Oral QHS  . simvastatin  40 mg Oral QHS   Continuous Infusions: . sodium chloride       LOS: 7 days    Time spent: 30 minutes.     Hosie Poisson, MD [facility] Pager [phone]  If 7PM-7AM, please contact night-coverage [email or web address] Password [facility] 06/17/2019, 3:15 PM

## 2019-06-17 NOTE — Consult Note (Signed)
Aariyah [facility] [place] Admit Date: 06/09/2019 06/17/2019 Rexene Agent Requesting Physician:  Karleen Hampshire MD  Reason for Consult:  AoCKD, Hpyervolemia, Management of Diuretics HPI:  73 year old female admitted 06/09/2019 after presenting with progressive lower extremity edema.  Patient background is having CKD 4-5 followed by Dr. Johnney Ou in our office.  She was last seen on 7/1 where she was over diuresed and diuretics were transiently held.  Diuretic dosing at that time was torsemide 100 mg twice daily and metolazone 2.5 mg every other day which changed to 100 mg in the morning and a second optional dose in the PM.  Creatinine at that time was 3.18.  Patient's presentation creatinine was 4.07.  She was treated for potential cellulitis, bilaterally, of the lower extremities with vancomycin x1 and a course of ceftriaxone.  Serum creatinine was initially stable but eventually has worsened and today is 4.98.  Diuretics have been on hold since 7/14.  Patient's other comorbidities include interstitial cystitis, which is severe and very limiting, chronic right-sided hydronephrosis with a longstanding right-sided percutaneous nephrostomy tube, gout, hypothyroidism, diastolic heart failure.  Today she tells me that she feels better.  Her appetite is stable.  No nausea or vomiting.  She is up and moving with nursing and therapy.  She thinks that her legs are improved.  Net I's and O's is 12.7 L negative since admission, however, weights are a little bit unclear but probably only down 3 kg from admission.  Urine output yesterday was 1.0 L.  She is using a Pleur-evac device and the nephrostomy collection system.  Palliative care has been working with the patient, currently continue to receive full scope of care.  She is aware that with her severe kidney disease dialysis is a real possibility.  She would like to wait as long as possible.  She understands that failure to control her volume status is an indication for  initiation.  No exposure to NSAIDs, IV contrast, ACEi or ARB.  Urine culture on admission was with multiple species present, no dominant.  Urine analysis at presentation with ongoing pyuria, trace proteinuria, 6-10 erythrocytes per high-powered field.   Creat (mg/dL)  Date Value  12/21/2018 3.24 (H)  09/14/2018 2.81 (H)  08/17/2018 2.84 (H)   Creatinine, Ser (mg/dL)  Date Value  06/17/2019 4.98 (H)  06/15/2019 4.46 (H)  06/13/2019 3.59 (H)  06/12/2019 3.65 (H)  06/11/2019 3.63 (H)  06/09/2019 4.01 (H)  06/09/2019 4.07 (H)  04/27/2019 3.54 (H)  04/22/2019 4.90 (H)  04/21/2019 4.63 (H)  ] I/Os: I/O last 3 completed shifts: In: 1200 [P.O.:1200] Out: 2275 [Urine:2275]    ROS NSAIDS: No identified exposure IV Contrast no identified exposure TMP/SMX no exposure Hypotension not present Balance of 12 systems is negative w/ exceptions as above  PMH  Past Medical History:  Diagnosis Date  . Arthritis    "knees, thumbs" (03/25/2018)  . CKD (chronic kidney disease), stage IV ([place])   . Diet-controlled diabetes mellitus ([place])   . GERD (gastroesophageal reflux disease)   . Gout    "on daily RX" (03/25/2018)  . Heart murmur   . High cholesterol   . Hypertension   . Hypothyroidism   . Iron deficiency anemia    "had to get an iron infusion"  . Migraine    "used to have them growing up" (03/25/2018)  . Presence of permanent cardiac pacemaker 03/25/2018   PSH  Past Surgical History:  Procedure Laterality Date  . APPENDECTOMY    . BIOPSY  05/19/2018  Procedure: BIOPSY;  Surgeon: Ladene Artist, MD;  Location: [facility];  Service: Endoscopy;;  . Consuela Mimes W/ RETROGRADES Bilateral 01/14/2019   Procedure: CYSTOSCOPY WITH RETROGRADE PYELOGRAM BILATERAL HYDRODISTENTION;  Surgeon: Ardis Hughs, MD;  Location: [facility];  Service: Urology;  Laterality: Bilateral;  . ESOPHAGOGASTRODUODENOSCOPY N/A 05/19/2018   Procedure: ESOPHAGOGASTRODUODENOSCOPY (EGD);  Surgeon: Ladene Artist, MD;  [facility];  Service: Endoscopy;  Laterality: N/A;  . ESOPHAGOGASTRODUODENOSCOPY (EGD) WITH PROPOFOL N/A 01/28/2019   Procedure: ESOPHAGOGASTRODUODENOSCOPY (EGD) WITH PROPOFOL;  Surgeon: Rush Landmark Telford Nab., MD;  Location: [facility];  Service: Gastroenterology;  Laterality: N/A;  . INSERT / REPLACE / REMOVE PACEMAKER  03/25/2018  . IR FLUORO GUIDE CV LINE RIGHT  05/18/2018  . IR LUMBAR DISC ASPIRATION W/IMG GUIDE  05/15/2018  . IR NEPHROSTOMY EXCHANGE RIGHT  05/28/2019  . IR NEPHROSTOMY PLACEMENT RIGHT  04/09/2019  . IR REMOVAL TUN CV CATH W/O FL  07/23/2018  . IR US GUIDE VASC ACCESS RIGHT  05/18/2018  . KNEE ARTHROSCOPY Bilateral   . PACEMAKER IMPLANT N/A 03/25/2018   Procedure: PACEMAKER IMPLANT;  Surgeon: Evans Lance, MD;  Location: [facility];  Service: Cardiovascular;  Laterality: N/A;  . PACEMAKER PLACEMENT Left 03/2018  . POLYPECTOMY  01/28/2019   Procedure: POLYPECTOMY;  Surgeon: Mansouraty, Telford Nab., MD;  Location: [facility];  Service: Gastroenterology;;  . RIGHT HEART CATH N/A 03/20/2018   Procedure: RIGHT HEART CATH;  Surgeon: Nigel Mormon, MD;  Location: [facility];  Service: Cardiovascular;  Laterality: N/A;  . TEE WITHOUT CARDIOVERSION N/A 03/20/2018   Procedure: TRANSESOPHAGEAL ECHOCARDIOGRAM (TEE);  Surgeon: Nigel Mormon, MD;  Location: [facility];  Service: Cardiovascular;  Laterality: N/A;  . TONSILLECTOMY    . URETEROSCOPY WITH HOLMIUM LASER LITHOTRIPSY Right 01/14/2019   Procedure: URETEROSCOPY WITH HOLMIUM LASER LITHOTRIPSY BLADDER BIOPSIES RIGHT STENT PLACEMENT;  Surgeon: Ardis Hughs, MD;  Location[facility];  Service: Urology;  Laterality: Right;   FH  Family History  Problem Relation Age of Onset  . Hypertension Mother   . Diabetes Mellitus II Father   . Heart disease Father   . Heart attack Father   . Gastric cancer Brother   . Diabetes Mellitus II Brother   . Stomach cancer Brother     SH  reports that she has never smoked. She has never used smokeless tobacco. She reports previous alcohol use. She reports that she does not use drugs. Allergies  Allergies  Allergen Reactions  . Baclofen Other (See Comments)    somnolence  . Codeine Other (See Comments)    Increases Pain and couldn't sleep   Home medications Prior to Admission medications   Medication Sig Start Date End Date Taking? Authorizing Provider  acetaminophen (TYLENOL) 325 MG tablet Take 1-2 tablets (325-650 mg total) by mouth every 4 (four) hours as needed for mild pain. 04/30/19  Yes Love, Ivan Anchors, PA-C  cetirizine (ZYRTEC) 10 MG tablet Take 10 mg by mouth at bedtime.    Yes [provider]  ferrous sulfate 325 (65 FE) MG tablet Take 1 tablet (325 mg total) by mouth 2 (two) times daily with a meal. 04/30/19  Yes Love, Ivan Anchors, PA-C  flavoxATE (URISPAS) 100 MG tablet Take 1 tablet (100 mg total) by mouth 3 (three) times daily as needed (bladder spasms, dysuria). 04/30/19  Yes Love, Ivan Anchors, PA-C  furosemide (LASIX) 80 MG tablet Take 1 tablet (80 mg total) by mouth 2 (two) times daily.  05/10/19  Yes Adrian Prows, MD  levothyroxine (SYNTHROID) 75 MCG tablet Take 1 tablet (75 mcg total) by mouth daily before breakfast. 04/30/19  Yes Love, Ivan Anchors, PA-C  Multiple Vitamin (MULTI-VITAMIN PO) Take 1 tablet by mouth daily.   Yes [provider]  oxyCODONE (OXY IR/ROXICODONE) 5 MG immediate release tablet Take 1 tablet (5 mg total) by mouth every 6 (six) hours as needed for severe pain. 04/30/19  Yes Love, Ivan Anchors, PA-C  pantoprazole (PROTONIX) 40 MG tablet Take 40 mg by mouth 2 (two) times daily.   Yes [provider]  potassium chloride SA (K-DUR) 20 MEQ tablet Take 40 mEq by mouth 2 (two) times daily. 06/02/19  Yes [provider]  simvastatin (ZOCOR) 40 MG tablet Take 1 tablet (40 mg total) by mouth at bedtime. 04/21/19  Yes Dessa Phi, DO  Biotin 5 MG CAPS Take 1 capsule (5 mg total)  by mouth daily. Patient not taking: Reported on 06/09/2019 01/14/19   Ardis Hughs, MD  darifenacin (ENABLEX) 15 MG 24 hr tablet Take 1 tablet (15 mg total) by mouth daily. Patient not taking: Reported on 06/09/2019 05/01/19   Love, Ivan Anchors, PA-C  docusate sodium (COLACE) 100 MG capsule Take 1 capsule (100 mg total) by mouth 2 (two) times daily. Patient taking differently: Take 100 mg by mouth 2 (two) times daily as needed for mild constipation.  04/30/19   Love, Ivan Anchors, PA-C  fluticasone (FLONASE) 50 MCG/ACT nasal spray Place 2 sprays into both nostrils daily as needed for allergies or rhinitis.    [provider]  folic acid (FOLVITE) 1 MG tablet Take 1 tablet (1 mg total) by mouth daily. Patient not taking: Reported on 06/09/2019 06/22/18   Love, Ivan Anchors, PA-C  gabapentin (NEURONTIN) 100 MG capsule Take 1 capsule (100 mg total) by mouth 3 (three) times daily. Patient taking differently: Take 100 mg by mouth daily as needed (pain).  04/30/19   Love, Ivan Anchors, PA-C  Menthol-Methyl Salicylate (MUSCLE RUB) 10-15 % CREA Apply 1 application topically every 4 (four) hours as needed for muscle pain. Patient not taking: Reported on 06/09/2019 04/30/19   Love, Ivan Anchors, PA-C  polyethylene glycol (MIRALAX / GLYCOLAX) 17 g packet Take 17 g by mouth 2 (two) times daily. Patient taking differently: Take 17 g by mouth daily as needed for mild constipation.  05/01/19   Love, Ivan Anchors, PA-C  sodium bicarbonate 650 MG tablet Take 2 tablets (1,300 mg total) by mouth 2 (two) times daily. Patient not taking: Reported on 06/09/2019 02/19/19   Georgette Shell, MD  vitamin B-12 (CYANOCOBALAMIN) 1000 MCG tablet Take 1 tablet (1,000 mcg total) by mouth daily. Patient not taking: Reported on 06/09/2019 06/22/18   Bary Leriche, PA-C    Current Medications Scheduled Meds: . docusate sodium  100 mg Oral BID  . ferrous sulfate  325 mg Oral Q breakfast  . gabapentin  100 mg Oral TID  . Gerhardt's butt cream    Topical Daily  . heparin  5,000 Units Subcutaneous Q8H  . levothyroxine  75 mcg Oral QAC breakfast  . loratadine  10 mg Oral Daily  . multivitamin with minerals  1 tablet Oral Daily  . pantoprazole  40 mg Oral Daily  . polyethylene glycol  17 g Oral BID  . senna  1 tablet Oral QHS  . simvastatin  40 mg Oral QHS   Continuous Infusions: . sodium chloride     PRN Meds:.sodium chloride,  acetaminophen **OR** acetaminophen, diclofenac sodium, flavoxATE, fluticasone, hydrALAZINE, ondansetron **OR** ondansetron (ZOFRAN) IV, oxyCODONE  CBC Recent Labs  Lab 06/11/19 0742 06/12/19 0934 06/13/19 0801  WBC 6.4 7.2 8.1  NEUTROABS 4.3  --   --   HGB 9.5* 9.6* 9.5*  HCT 29.0* 30.5* 29.9*  MCV 92.4 94.1 93.7  PLT 216 244 696   Basic Metabolic Panel Recent Labs  Lab 06/11/19 0742 06/12/19 0934 06/13/19 0801 06/15/19 0539 06/17/19 0415  NA 135 135 134* 133* 131*  K 3.7 3.7 3.9 4.0 4.7  CL 100 100 98 99 96*  CO2 21* 23 20* 19* 21*  GLUCOSE 99 133* 103* 115* 103*  BUN 79* 69* 64* 73* 84*  CREATININE 3.63* 3.65* 3.59* 4.46* 4.98*  CALCIUM 9.3 9.4 9.6 9.5 9.7  PHOS 4.1 3.8 4.6 6.1*  --     Physical Exam  Blood pressure 121/73, pulse 81, temperature 98.1 F (36.7 C), temperature source Oral, resp. rate 17, height 5\' 5"  (1.651 m), weight 66.1 kg, SpO2 99 %. GEN: Chronically ill-appearing, conversant, no acute distress, lying in bed ENT: NCAT EYES: EOMI, glasses CV: RRR, no rub normal S1 and S2 PULM: CTA B, normal work of breathing ABD: Soft, nontender SKIN: Bilateral legs are wrapped EXT: As above, legs wrapped GU: Right-sided nephrostomy in place, pure wick in place  Assessment 73 year old female with late stage CKD and quite a bit of lability with persistent problems of volume status and diuretic management who presented with hypervolemia and lower extremity edema 7 days after being in our office with overdiuresis.  It appears that she has clinically improved but over the past  week or so her creatinine is worsened to 4.98.  She has no overt uremic symptoms.  She is open to dialysis if needed.  Given her interstitial nephritis and overall status, she would probably have a difficult time with chronic hemodialysis.  Hopefully, we can help her maintain euvolemic with stable renal function and avoid dialysis in the near future.  1. CKD5, labile GFR and VOlume status; GFR worsened with IV diuretics, now on hold for >  48 hours; not uremic; no AVF has been referred as outpt 2. Hypervolemia, lower extremity edema, diastolic heart failure 3. Chronic interstitial cystitis, followed by [facility] 4. Deconditioning, working with PT and OT, tentative plan to return home with home health services; patient declined SNF 5. Hypertension, currently stable 6. Anemia, mild 7. Mild hyponatremia  Plan 1. Would continue to hold diuretics until GFR is stable or improving.  At that point would resume torsemide 100 mg orally daily, and monitor to see how she does and if additional dosing is necessary. 2. If GFR further declines with the initiation of diuretics, I think she would need to initiate dialysis 3. We will continue to follow closely 4. Daily weights, Daily Renal Panel, Strict I/Os, Avoid nephrotoxins (NSAIDs, judicious IV Contrast)    Pearson Grippe MD 06/17/2019, 2:02 PM

## 2019-06-17 NOTE — Progress Notes (Signed)
Update given to daughter in law, family would like a hospital bed when patient is discharged.

## 2019-06-17 NOTE — Care Management Important Message (Signed)
Important Message  Patient Details IM Letter given to Rhea Pink RN to present to the Patient Name: Erika Fry MRN: [id number] Date of Birth: [birth year]   Medicare Important Message Given:  Yes     Kerin Salen 06/17/2019, 11:11 AM

## 2019-06-17 NOTE — Progress Notes (Signed)
Occupational Therapy Treatment Patient Details Name: Erika Fry MRN: [id number] DOB: 06/11/1946 Today's Date: 06/17/2019    History of present illness 73 y.o. female with medical history significant of hypertension, hyperlipidemia, diet-controlled diabetes, GERD, hypothyroidism, gout, pacemaker placement, iron deficiency anemia, CKD-4, CHF, peripheral neuropathy, and recent R nephrostomy drain placed 04/2019 who presents with bilateral leg edema.   OT comments  Pt is very motivated. Got up to [facility] and walked in hall at her request with min guard. She also asked nursing to help to walk so that she can build up leg strength. Will continue to follow.  Making good progress.  Follow Up Recommendations  Home health OT;Supervision/Assistance - 24 hour    Equipment Recommendations  None recommended by OT    Recommendations for Other Services      Precautions / Restrictions Precautions Precautions: Fall Precaution Comments: large fluid filled blister on bil LEs, LEs very tender to touch Restrictions Weight Bearing Restrictions: No       Mobility Bed Mobility         Supine to sit: Min guard;HOB elevated Sit to supine: Min guard;HOB elevated   General bed mobility comments: Increased time.  Transfers   Equipment used: Rolling walker (2 wheeled)   Sit to Stand: Min guard         General transfer comment: for safety; no physical assistance needed today    Balance             Standing balance-Leahy Scale: Poor                             ADL either performed or assessed with clinical judgement   ADL                           Toilet Transfer: Min guard;Stand-pivot;BSC;RW   Toileting- Clothing Manipulation and Hygiene: Moderate assistance;Maximal assistance;Sit to/from stand         General ADL Comments: pt did not feel she could wipe herself after toileting, but she did assist with pants/pull up garment.  Pt very motivated and  requested to walk in hall to nursing station with min guard A. She had walked earlier with nursing. Pt does fatique (arms)     Vision       Perception     Praxis      Cognition Arousal/Alertness: Awake/alert Behavior During Therapy: WFL for tasks assessed/performed Overall Cognitive Status: Within Functional Limits for tasks assessed                                          Exercises     Shoulder Instructions       General Comments      Pertinent Vitals/ Pain       Pain Assessment: Faces Faces Pain Scale: Hurts whole lot Pain Location: groin Pain Descriptors / Indicators: Aching;Burning Pain Intervention(s): Limited activity within patient's tolerance;Monitored during session;Repositioned;Patient requesting pain meds-RN notified  Home Living                                          Prior Functioning/Environment              Frequency  Min 2X/week  Progress Toward Goals  OT Goals(current goals can now be found in the care plan section)  Progress towards OT goals: Progressing toward goals     Plan      Co-evaluation                 AM-PAC OT "6 Clicks" Daily Activity     Outcome Measure   Help from another person eating meals?: None Help from another person taking care of personal grooming?: A Little Help from another person toileting, which includes using toliet, bedpan, or urinal?: A Lot Help from another person bathing (including washing, rinsing, drying)?: A Lot Help from another person to put on and taking off regular upper body clothing?: A Little Help from another person to put on and taking off regular lower body clothing?: Total 6 Click Score: 15    End of Session    OT Visit Diagnosis: Pain Pain - part of body: Leg(bil)   Activity Tolerance     Patient Left     Nurse Communication          Time: 0051-1021 OT Time Calculation (min): 20 min  Charges: OT General Charges $OT  Visit: 1 Visit OT Treatments $Self Care/Home Management : 8-22 mins  Lesle Chris, OTR/L Acute Rehabilitation Services [phone] [facility] pager [phone] office 06/17/2019   [place] 06/17/2019, 3:33 PM

## 2019-06-18 LAB — BASIC METABOLIC PANEL
Anion gap: 15 (ref 5–15)
BUN: 88 mg/dL — ABNORMAL HIGH (ref 8–23)
CO2: 19 mmol/L — ABNORMAL LOW (ref 22–32)
Calcium: 9.5 mg/dL (ref 8.9–10.3)
Chloride: 97 mmol/L — ABNORMAL LOW (ref 98–111)
Creatinine, Ser: 5.21 mg/dL — ABNORMAL HIGH (ref 0.44–1.00)
GFR calc Af Amer: 9 mL/min — ABNORMAL LOW (ref >60)
GFR calc non Af Amer: 7 mL/min — ABNORMAL LOW (ref >60)
Glucose, Bld: 154 mg/dL — ABNORMAL HIGH (ref 70–99)
Potassium: 4.6 mmol/L (ref 3.5–5.1)
Sodium: 131 mmol/L — ABNORMAL LOW (ref 135–145)

## 2019-06-18 LAB — RENAL FUNCTION PANEL
Albumin: 2.6 g/dL — ABNORMAL LOW (ref 3.5–5.0)
Anion gap: 16 — ABNORMAL HIGH (ref 5–15)
BUN: 88 mg/dL — ABNORMAL HIGH (ref 8–23)
CO2: 18 mmol/L — ABNORMAL LOW (ref 22–32)
Calcium: 9.5 mg/dL (ref 8.9–10.3)
Chloride: 97 mmol/L — ABNORMAL LOW (ref 98–111)
Creatinine, Ser: 5.42 mg/dL — ABNORMAL HIGH (ref 0.44–1.00)
GFR calc Af Amer: 8 mL/min — ABNORMAL LOW (ref >60)
GFR calc non Af Amer: 7 mL/min — ABNORMAL LOW (ref >60)
Glucose, Bld: 153 mg/dL — ABNORMAL HIGH (ref 70–99)
Phosphorus: 5.8 mg/dL — ABNORMAL HIGH (ref 2.5–4.6)
Potassium: 4.6 mmol/L (ref 3.5–5.1)
Sodium: 131 mmol/L — ABNORMAL LOW (ref 135–145)

## 2019-06-18 NOTE — Progress Notes (Signed)
Physical Therapy Treatment Patient Details Name: Erika Fry MRN: [id number] DOB: [birth year] Today's Date: 06/18/2019    History of Present Illness 73 y.o. female with medical history significant of hypertension, hyperlipidemia, diet-controlled diabetes, GERD, hypothyroidism, gout, pacemaker placement, iron deficiency anemia, CKD-4, CHF, peripheral neuropathy, and recent R nephrostomy drain placed 04/2019 who presents with bilateral leg edema.    PT Comments    Pt agreeable to ambulate however not able to tolerate due to LE pain.  Pt assisted back to bed from recliner and elevated LEs.   Follow Up Recommendations  Home health PT;Supervision/Assistance - 24 hour     Equipment Recommendations  None recommended by PT    Recommendations for Other Services       Precautions / Restrictions Precautions Precautions: Fall Precaution Comments: large fluid filled blister on bil LEs, LEs very tender to touch    Mobility  Bed Mobility Overal bed mobility: Needs Assistance Bed Mobility: Sit to Supine       Sit to supine: Mod assist   General bed mobility comments: increased time, assist for LEs due to pain  Transfers Overall transfer level: Needs assistance Equipment used: Rolling walker (2 wheeled) Transfers: Sit to/from [facility] Sit to Stand: Mod assist Stand pivot transfers: Mod assist       General transfer comment: pt with increased pain upon dependent LEs positioning, required assist to rise and steady due to posterior lean, improved balance with time and march in place prior to taking a few steps over to bed, requiring mod assist to steady with turning  Ambulation/Gait                 Stairs             Wheelchair Mobility    Modified Rankin (Stroke Patients Only)       Balance                                            Cognition Arousal/Alertness: Awake/alert Behavior During Therapy: WFL for tasks  assessed/performed Overall Cognitive Status: Within Functional Limits for tasks assessed                                        Exercises      General Comments        Pertinent Vitals/Pain Pain Assessment: Faces Faces Pain Scale: Hurts whole lot Pain Location: LEs Pain Descriptors / Indicators: Sore Pain Intervention(s): Limited activity within patient's tolerance;Monitored during session;Repositioned    Home Living                      Prior Function            PT Goals (current goals can now be found in the care plan section) Progress towards PT goals: Progressing toward goals    Frequency    Min 3X/week      PT Plan Current plan remains appropriate    Co-evaluation              AM-PAC PT "6 Clicks" Mobility   Outcome Measure  Help needed turning from your back to your side while in a flat bed without using bedrails?: A Little Help needed moving from lying on your back to sitting on the  side of a flat bed without using bedrails?: A Little Help needed moving to and from a bed to a chair (including a wheelchair)?: A Lot Help needed standing up from a chair using your arms (e.g., wheelchair or bedside chair)?: A Lot Help needed to walk in hospital room?: A Lot Help needed climbing 3-5 steps with a railing? : Total 6 Click Score: 13    End of Session   Activity Tolerance: Patient limited by pain Patient left: in bed;with bed alarm set;with call bell/phone within reach   PT Visit Diagnosis: Muscle weakness (generalized) (M62.81);Difficulty in walking, not elsewhere classified (R26.2)     Time: 6720-9470 PT Time Calculation (min) (ACUTE ONLY): 15 min  Charges:  $Therapeutic Activity: 8-22 mins                     Carmelia Bake, PT, DPT Acute Rehabilitation Services Office: [phone] Pager: [phone]  Trena Platt 06/18/2019, 12:34 PM

## 2019-06-18 NOTE — Progress Notes (Signed)
PROGRESS NOTE    Erika Fry  PXT:[id number] DOB: [birth year] DOA: 06/09/2019 PCP: [name], MD  Brief Narrative:  73 y.o.femalewith medical history significant ofhypertension, hyperlipidemia, diet-controlled diabetes, GERD, hypothyroidism, gout, pacemaker placement, iron deficiency anemia, CKD-4, CHF, who presents with bilateral leg edema.  Assessment & Plan:   Principal Problem:   Bilateral leg edema Active Problems:   Chronic kidney disease (CKD), stage IV (severe) (HCC)   Diet-controlled diabetes mellitus (HCC)   GERD (gastroesophageal reflux disease)   High cholesterol   Hypothyroidism   Iron deficiency anemia   Essential hypertension   UTI (urinary tract infection)   Weakness generalized   Chronic diastolic CHF (congestive heart failure) (HCC)   Cellulitis of lower extremity   Acute on chronic diastolic CHF (congestive heart failure) ([place])   Hypomagnesemia   DNR (do not resuscitate) discussion   Cystitis   Pain in both lower extremities   Recurrent bilateral leg edema with cellulitis of the lower extremities:  Completed the course of antibiotics.  Possibly from diabetic neuropathy.  Probably a component of acute diastolic heart failure.  She was diuresed appropriately with IV lasix, but since her creatinine worsened with IV diuresis, lasix was held.  Repeat renal parameters show worsening of creatinine.  Continue to watch creatinine to see if she needs HD. Currently her k is wnl and she does not appear uremic.  Appreciate nephrology recommendations.     Type 2 dm: CBG (last 3)  No results for input(s): GLUCAP in the last 72 hours.  ? Diet controlled.    Acute on STAGE 4 CKD Creatinine worsened with IV diuresis.  Holding lasix and repeat renal parameters in am.   Hypertension: Well controlled.    Hypothyroidism: Resume synthroid.    UTI:  Completed the course of antibiotics.    Constipation:  Resume miralax and colace.    GERD; Stable.    Hyponatremia:  Stable around 131.  Monitor.  Asymptomatic.    Low grade temp, pyuria, : Get urine cultures. She completed a course of antibiotics less than a week ago.    DVT prophylaxis: heparin.  Code Status: full code.  Family Communication: none at bedside.called son and updated on 7/16.   Disposition Plan: plan to d/c home in 1 to 2 days, once her creatinine improves.   Consultants:   Palliative care.   Nephrology   Procedures: none.   Antimicrobials:  None.   Subjective: She reports some leg pain. No chest pain or sob.    Objective: Vitals:   06/17/19 0437 06/17/19 1300 06/17/19 2202 06/18/19 0615  BP: 113/69 121/73 108/62 133/70  Pulse: 75 81 68 89  Resp: 12 17 16 14   Temp: 98.5 F (36.9 C) 98.1 F (36.7 C) 98.9 F (37.2 C) 100 F (37.8 C)  TempSrc: Oral Oral Oral Oral  SpO2: 97% 99% 100% 100%  Weight: 66.1 kg   66.4 kg  Height:        Intake/Output Summary (Last 24 hours) at 06/18/2019 1401 Last data filed at 06/18/2019 0958 Gross per 24 hour  Intake 120 ml  Output 1425 ml  Net -1305 ml   Filed Weights   06/16/19 0622 06/17/19 0437 06/18/19 0615  Weight: 65.2 kg 66.1 kg 66.4 kg    Examination:  General exam: Appears calm and comfortable , no distress.  Respiratory system: Clear to auscultation. Respiratory effort normal.no wheezing or rhonchi.  Cardiovascular system: S1 & S2 heard, RRR. No JVD,  Gastrointestinal system: Abdomen is soft  NT ND BS+ Central nervous system: Alert and oriented. No focal neurological deficits. Extremities: tender lower extremities, legs bandaged.  Skin: No rashes, lesions or ulcers Psychiatry:Mood & affect appropriate.     Data Reviewed: I have personally reviewed following labs and imaging studies  CBC: Recent Labs  Lab 06/12/19 0934 06/13/19 0801  WBC 7.2 8.1  HGB 9.6* 9.5*  HCT 30.5* 29.9*  MCV 94.1 93.7  PLT 244 177   Basic Metabolic Panel: Recent Labs  Lab  06/12/19 0934 06/13/19 0801 06/15/19 0539 06/17/19 0415 06/18/19 1231 06/18/19 1232  NA 135 134* 133* 131* 131* 131*  K 3.7 3.9 4.0 4.7 4.6 4.6  CL 100 98 99 96* 97* 97*  CO2 23 20* 19* 21* 19* 18*  GLUCOSE 133* 103* 115* 103* 154* 153*  BUN 69* 64* 73* 84* 88* 88*  CREATININE 3.65* 3.59* 4.46* 4.98* 5.21* 5.42*  CALCIUM 9.4 9.6 9.5 9.7 9.5 9.5  MG 1.9 1.9  --   --   --   --   PHOS 3.8 4.6 6.1*  --   --  5.8*   GFR: Estimated Creatinine Clearance: 8.1 mL/min (A) (by C-G formula based on SCr of 5.42 mg/dL (H)). Liver Function Tests: Recent Labs  Lab 06/12/19 0934 06/13/19 0801 06/15/19 0539 06/18/19 1232  ALBUMIN 3.1* 3.1* 3.0* 2.6*   No results for input(s): LIPASE, AMYLASE in the last 168 hours. No results for input(s): AMMONIA in the last 168 hours. Coagulation Profile: No results for input(s): INR, PROTIME in the last 168 hours. Cardiac Enzymes: No results for input(s): CKTOTAL, CKMB, CKMBINDEX, TROPONINI in the last 168 hours. BNP (last 3 results) No results for input(s): PROBNP in the last 8760 hours. HbA1C: No results for input(s): HGBA1C in the last 72 hours. CBG: No results for input(s): GLUCAP in the last 168 hours. Lipid Profile: No results for input(s): CHOL, HDL, LDLCALC, TRIG, CHOLHDL, LDLDIRECT in the last 72 hours. Thyroid Function Tests: No results for input(s): TSH, T4TOTAL, FREET4, T3FREE, THYROIDAB in the last 72 hours. Anemia Panel: No results for input(s): VITAMINB12, FOLATE, FERRITIN, TIBC, IRON, RETICCTPCT in the last 72 hours. Sepsis Labs: No results for input(s): PROCALCITON, LATICACIDVEN in the last 168 hours.  Recent Results (from the past 240 hour(s))  Urine culture     Status: Abnormal   Collection Time: 06/09/19  5:17 PM   Specimen: Urine, Clean Catch  Result Value Ref Range Status   Specimen Description   Final    URINE, CLEAN CATCH Performed at [facility], [place] [address] [place], [place] [address]     Special Requests   Final    NONE Performed at [facility], [place] [address] [place], [place] [address]    Culture MULTIPLE SPECIES PRESENT, SUGGEST RECOLLECTION (A)  Final   Report Status 06/10/2019 FINAL  Final  SARS Coronavirus 2 (CEPHEID - Performed in [place] hospital lab), Hosp Order     Status: None   Collection Time: 06/09/19  5:17 PM   Specimen: Nasopharyngeal Swab  Result Value Ref Range Status   SARS Coronavirus 2 NEGATIVE NEGATIVE Final    Comment: (NOTE) If result is NEGATIVE SARS-CoV-2 target nucleic acids are NOT DETECTED. The SARS-CoV-2 RNA is generally detectable in upper and lower  respiratory specimens during the acute phase of infection. The lowest  concentration of SARS-CoV-2 viral copies this assay can detect is 250  copies / mL. A negative result does not preclude SARS-CoV-2 infection  and should  not be used as the sole basis for treatment or other  patient management decisions.  A negative result may occur with  improper specimen collection / handling, submission of specimen other  than nasopharyngeal swab, presence of viral mutation(s) within the  areas targeted by this assay, and inadequate number of viral copies  (<250 copies / mL). A negative result must be combined with clinical  observations, patient history, and epidemiological information. If result is POSITIVE SARS-CoV-2 target nucleic acids are DETECTED. The SARS-CoV-2 RNA is generally detectable in upper and lower  respiratory specimens dur ing the acute phase of infection.  Positive  results are indicative of active infection with SARS-CoV-2.  Clinical  correlation with patient history and other diagnostic information is  necessary to determine patient infection status.  Positive results do  not rule out bacterial infection or co-infection with other viruses. If result is PRESUMPTIVE POSTIVE SARS-CoV-2 nucleic acids MAY BE PRESENT.   A presumptive positive result was  obtained on the submitted specimen  and confirmed on repeat testing.  While 2019 novel coronavirus  (SARS-CoV-2) nucleic acids may be present in the submitted sample  additional confirmatory testing may be necessary for epidemiological  and / or clinical management purposes  to differentiate between  SARS-CoV-2 and other Sarbecovirus currently known to infect humans.  If clinically indicated additional testing with an alternate test  methodology [phone]) is advised. The SARS-CoV-2 RNA is generally  detectable in upper and lower respiratory sp ecimens during the acute  phase of infection. The expected result is Negative. Fact Sheet for Patients:  StrictlyIdeas.no Fact Sheet for Healthcare Providers: BankingDealers.co.za This test is not yet approved or cleared by the [place] FDA and has been authorized for detection and/or diagnosis of SARS-CoV-2 by FDA under an Emergency Use Authorization (EUA).  This EUA will remain in effect (meaning this test can be used) for the duration of the COVID-19 declaration under Section 564(b)(1) of the Act, 21 U.S.C. section 360bbb-3(b)(1), unless the authorization is terminated or revoked sooner. Performed at [facility], [place] [address], [place], [place] [address]   Culture, blood (routine x 2)     Status: None   Collection Time: 06/09/19  5:41 PM   Specimen: BLOOD  Result Value Ref Range Status   Specimen Description   Final    BLOOD RIGHT ANTECUBITAL Performed at [place] [address], [place], [place] [address]    Special Requests   Final    BOTTLES DRAWN AEROBIC AND ANAEROBIC Blood Culture adequate volume Performed at [place] [address], [place], [place] [address]    Culture   Final    NO GROWTH 5 DAYS Performed at [facility], [place] [address], [place], [place] [address]    Report Status 06/14/2019 FINAL   Final  Culture, blood (routine x 2)     Status: None   Collection Time: 06/09/19  9:58 PM   Specimen: BLOOD  Result Value Ref Range Status   Specimen Description   Final    BLOOD BLOOD LEFT HAND Performed at [place] [address], [place], [place] [address]    Special Requests   Final    BOTTLES DRAWN AEROBIC ONLY Blood Culture adequate volume Performed at [place] [address], [place], [place] [address]    Culture   Final    NO GROWTH 5 DAYS Performed at [facility], [place] [address]  [place], [place] [address]    Report Status 06/14/2019 FINAL  Final         Radiology Studies: No results found.      Scheduled Meds: . docusate sodium  100 mg Oral BID  . ferrous sulfate  325 mg Oral Q breakfast  . gabapentin  100 mg Oral TID  . Gerhardt's butt cream   Topical Daily  . heparin  5,000 Units Subcutaneous Q8H  . levothyroxine  75 mcg Oral QAC breakfast  . loratadine  10 mg Oral Daily  . multivitamin with minerals  1 tablet Oral Daily  . pantoprazole  40 mg Oral Daily  . polyethylene glycol  17 g Oral BID  . senna  1 tablet Oral QHS  . simvastatin  40 mg Oral QHS   Continuous Infusions: . sodium chloride       LOS: 8 days    Time spent: 30 minutes.     Hosie Poisson, MD [facility] Pager [phone]  If 7PM-7AM, please contact night-coverage [email or web address] Password [email or web address] 06/18/2019, 2:01 PM

## 2019-06-18 NOTE — Progress Notes (Signed)
Subjective:  Low grade temps, c/o pain - labs not resulted yet this AM 1325 of UOP - she is eating lunch  Objective Vital signs in last 24 hours: Vitals:   06/17/19 0437 06/17/19 1300 06/17/19 2202 06/18/19 0615  BP: 113/69 121/73 108/62 133/70  Pulse: 75 81 68 89  Resp: 12 17 16 14   Temp: 98.5 F (36.9 C) 98.1 F (36.7 C) 98.9 F (37.2 C) 100 F (37.8 C)  TempSrc: Oral Oral Oral Oral  SpO2: 97% 99% 100% 100%  Weight: 66.1 kg   66.4 kg  Height:       Weight change: 0.3 kg  Intake/Output Summary (Last 24 hours) at 06/18/2019 1229 Last data filed at 06/18/2019 5465 Gross per 24 hour  Intake 120 ml  Output 1425 ml  Net -1305 ml    Assessment/ Plan: Pt is a 73 y.o. yo female with late stage CKD and quite a bit of lability with persistent problems of volume status and diuretic management who presented with hypervolemia and lower extremity edema 7 days after being in our office with overdiuresis.  It appears that she has clinically improved but over the past week or so her creatinine is worsened to 4.98 Assessment/Plan: 1. Advanced CKD but labile and dep on volume status.  Difficult to tell but does not seem uremic so no needs for dialysis although she is open to it if needed.  I agree to hold diuretics until see stability of crt- then to resume at dose in middle of 100 BID and 100 daily - used metolazone as well as OP  2. HTN/vol- thought to be dry when saw Dr. Johnney Ou 7/1- then presented wet - weight got as low as 64.6- then held diuretics, weight today 66.4 3. Anemia- stable in the 9's  4. Hyponatremia -  Keep an eye on as diuretics continue to be held  5.  Deconditioning- chronic issue- PT/OT    Louis Meckel    Labs: Basic Metabolic Panel: Recent Labs  Lab 06/12/19 0934 06/13/19 0801 06/15/19 0539 06/17/19 0415  NA 135 134* 133* 131*  K 3.7 3.9 4.0 4.7  CL 100 98 99 96*  CO2 23 20* 19* 21*  GLUCOSE 133* 103* 115* 103*  BUN 69* 64* 73* 84*  CREATININE 3.65*  3.59* 4.46* 4.98*  CALCIUM 9.4 9.6 9.5 9.7  PHOS 3.8 4.6 6.1*  --    Liver Function Tests: Recent Labs  Lab 06/12/19 0934 06/13/19 0801 06/15/19 0539  ALBUMIN 3.1* 3.1* 3.0*   No results for input(s): LIPASE, AMYLASE in the last 168 hours. No results for input(s): AMMONIA in the last 168 hours. CBC: Recent Labs  Lab 06/12/19 0934 06/13/19 0801  WBC 7.2 8.1  HGB 9.6* 9.5*  HCT 30.5* 29.9*  MCV 94.1 93.7  PLT 244 217   Cardiac Enzymes: No results for input(s): CKTOTAL, CKMB, CKMBINDEX, TROPONINI in the last 168 hours. CBG: No results for input(s): GLUCAP in the last 168 hours.  Iron Studies: No results for input(s): IRON, TIBC, TRANSFERRIN, FERRITIN in the last 72 hours. Studies/Results: No results found. Medications: Infusions: . sodium chloride      Scheduled Medications: . docusate sodium  100 mg Oral BID  . ferrous sulfate  325 mg Oral Q breakfast  . gabapentin  100 mg Oral TID  . Gerhardt's butt cream   Topical Daily  . heparin  5,000 Units Subcutaneous Q8H  . levothyroxine  75 mcg Oral QAC breakfast  . loratadine  10  mg Oral Daily  . multivitamin with minerals  1 tablet Oral Daily  . pantoprazole  40 mg Oral Daily  . polyethylene glycol  17 g Oral BID  . senna  1 tablet Oral QHS  . simvastatin  40 mg Oral QHS    have reviewed scheduled and prn medications.  Physical Exam: General: deconditioned but NAD- pleasant Heart: RRR Lungs: mostly clear  Abdomen: soft, non tender Extremities: some dep edema    06/18/2019,12:29 PM  LOS: 8 days

## 2019-06-19 LAB — RENAL FUNCTION PANEL
Albumin: 2.7 g/dL — ABNORMAL LOW (ref 3.5–5.0)
Anion gap: 13 (ref 5–15)
BUN: 95 mg/dL — ABNORMAL HIGH (ref 8–23)
CO2: 21 mmol/L — ABNORMAL LOW (ref 22–32)
Calcium: 9.8 mg/dL (ref 8.9–10.3)
Chloride: 98 mmol/L (ref 98–111)
Creatinine, Ser: 5.42 mg/dL — ABNORMAL HIGH (ref 0.44–1.00)
GFR calc Af Amer: 8 mL/min — ABNORMAL LOW (ref >60)
GFR calc non Af Amer: 7 mL/min — ABNORMAL LOW (ref >60)
Glucose, Bld: 111 mg/dL — ABNORMAL HIGH (ref 70–99)
Phosphorus: 6.3 mg/dL — ABNORMAL HIGH (ref 2.5–4.6)
Potassium: 4.7 mmol/L (ref 3.5–5.1)
Sodium: 132 mmol/L — ABNORMAL LOW (ref 135–145)

## 2019-06-19 MED ORDER — SENNA 8.6 MG PO TABS: 1.0000 | ORAL_TABLET | Freq: Every day | ORAL | 0 refills | Status: DC

## 2019-06-19 MED ORDER — TORSEMIDE 100 MG PO TABS
100.0000 mg | ORAL_TABLET | Freq: Every day | ORAL | Status: DC
  Administered 2019-06-19 – 2019-06-21 (×3): 100 mg via ORAL
  Filled 2019-06-19 (×3): qty 1

## 2019-06-19 MED ORDER — CEPHALEXIN 250 MG PO CAPS: 250.0000 mg | ORAL_CAPSULE | Freq: Every day | ORAL | 0 refills | Status: DC

## 2019-06-19 MED ORDER — TORSEMIDE 100 MG PO TABS: 100.0000 mg | ORAL_TABLET | Freq: Two times a day (BID) | ORAL | 0 refills | Status: DC

## 2019-06-19 MED ORDER — METOLAZONE 2.5 MG PO TABS: 2.5000 mg | ORAL_TABLET | Freq: Every day | ORAL | 0 refills | Status: DC | PRN

## 2019-06-19 NOTE — TOC Progression Note (Signed)
Transition of Care [facility]) - Progression Note    Patient Details  Name: Erika Fry MRN: [id number] Date of Birth: [birth year]  [facility] of [facility]) CM/SW Contact  Joaquin Courts, RN Phone Number: 06/19/2019, 3:36 PM  Clinical Narrative:    CM arranged for [employer] to deliver hospital bed.     Expected Discharge Plan: [place] Barriers to Discharge: Continued Medical Work up  Expected Discharge Plan and Services Expected Discharge Plan: [place] In-house Referral: Clinical Social Work   Post Acute Care Choice: [place] arrangements for the past 2 months: [place] Expected Discharge Date: 06/19/19                           [facility] Agency: [facility] Date [place]: 06/16/19 Time [place]: 1255 Representative spoke with at [place]: [place] ([place]) Interventions    Readmission Risk Interventions Readmission Risk Prevention Plan 06/15/2019 04/13/2019  Transportation Screening Complete Complete  Medication Review Press photographer) Complete Complete  PCP or Specialist appointment within 3-5 days of discharge Not Complete Complete  PCP/Specialist Appt Not Complete comments not ready for dc -  HRI or [place] Complete Complete  SW Recovery Care/Counseling Consult Complete Complete  Palliative Care Screening Not Applicable Not [place] Not Applicable Patient Refused  Some recent data might be hidden

## 2019-06-19 NOTE — Progress Notes (Signed)
Pt temp was 100.4 so was given a tylenol will recheck and monitor.

## 2019-06-19 NOTE — Progress Notes (Signed)
Ms Inthavong has  Chronic diastolic heart failure,  stage 4 to 5 CKD heading towards HD in the near future, diabetic neuropathy, bilateral pedal edema with blisters, requires head end of the bed to be elevated for breathing better  and frequent re positioning of her body and legs to prevent pressure sores. She is very deconditioned and unable to get out of bed by herself without assistance. A hospital bed might help her with the above .    Hosie Poisson, MD [phone]

## 2019-06-19 NOTE — Progress Notes (Signed)
Subjective:  Low grade temps, c/o pain - 2000 of UOP - crt seems to be peaking - weight pretty stable - no new complaints   Objective Vital signs in last 24 hours: Vitals:   06/18/19 0615 06/18/19 1422 06/18/19 2159 06/19/19 0535  BP: 133/70 111/63 126/63 (!) 112/58  Pulse: 89 84 81 80  Resp: 14 12 18 19   Temp: 100 F (37.8 C) 99.9 F (37.7 C) 99 F (37.2 C) 98.2 F (36.8 C)  TempSrc: Oral Oral Oral Oral  SpO2: 100% 99% 96% 99%  Weight: 66.4 kg     Height:       Weight change:   Intake/Output Summary (Last 24 hours) at 06/19/2019 1015 Last data filed at 06/19/2019 0538 Gross per 24 hour  Intake 480 ml  Output 1400 ml  Net -920 ml    Assessment/ Plan: Pt is a 73 y.o. yo female with late stage CKD and quite a bit of lability with persistent problems of volume status and diuretic management who presented with hypervolemia and lower extremity edema 7 days after being in our office with overdiuresis.  It appears that she has clinically improved but over the past few days creatinine has worsened Assessment/Plan: 1. Advanced CKD but labile and dep on volume status.  Difficult to tell but does not seem uremic so no needs for dialysis although she is open to it if needed.  I agree to previously  hold diuretics. -  to resume at dose in middle of 100 BID with metolazone qod and 100 daily - will give 100 of torsemide today then would send out on torsemide 100 BID but with daily weights and instructions to use metolazone 2.5 PRN for weight gain larger than 4 pounds.  I tried to go over this with her-  She will need more instruction   2. HTN/vol- thought to be dry when saw Dr. Johnney Ou 7/1- then presented wet - weight got as low as 64.6- then held diuretics, weight today 66.4 3. Anemia- stable in the 9's  4. Hyponatremia -  Likely hypervolemic hyponatremia - will resume diuretics today 5.  Deconditioning- chronic issue- PT/OT   Renal has no objection for discharge with the above diuretic plan-  will make sure has follow up at [place] - call with questions    Louis Meckel    Labs: Basic Metabolic Panel: Recent Labs  Lab 06/15/19 0539  06/18/19 1231 06/18/19 1232 06/19/19 0605  NA 133*   < > 131* 131* 132*  K 4.0   < > 4.6 4.6 4.7  CL 99   < > 97* 97* 98  CO2 19*   < > 19* 18* 21*  GLUCOSE 115*   < > 154* 153* 111*  BUN 73*   < > 88* 88* 95*  CREATININE 4.46*   < > 5.21* 5.42* 5.42*  CALCIUM 9.5   < > 9.5 9.5 9.8  PHOS 6.1*  --   --  5.8* 6.3*   < > = values in this interval not displayed.   Liver Function Tests: Recent Labs  Lab 06/15/19 0539 06/18/19 1232 06/19/19 0605  ALBUMIN 3.0* 2.6* 2.7*   No results for input(s): LIPASE, AMYLASE in the last 168 hours. No results for input(s): AMMONIA in the last 168 hours. CBC: Recent Labs  Lab 06/13/19 0801  WBC 8.1  HGB 9.5*  HCT 29.9*  MCV 93.7  PLT 217   Cardiac Enzymes: No results for input(s): CKTOTAL, CKMB, CKMBINDEX, TROPONINI in  the last 168 hours. CBG: No results for input(s): GLUCAP in the last 168 hours.  Iron Studies: No results for input(s): IRON, TIBC, TRANSFERRIN, FERRITIN in the last 72 hours. Studies/Results: No results found. Medications: Infusions: . sodium chloride      Scheduled Medications: . docusate sodium  100 mg Oral BID  . ferrous sulfate  325 mg Oral Q breakfast  . gabapentin  100 mg Oral TID  . Gerhardt's butt cream   Topical Daily  . heparin  5,000 Units Subcutaneous Q8H  . levothyroxine  75 mcg Oral QAC breakfast  . loratadine  10 mg Oral Daily  . multivitamin with minerals  1 tablet Oral Daily  . pantoprazole  40 mg Oral Daily  . polyethylene glycol  17 g Oral BID  . senna  1 tablet Oral QHS  . simvastatin  40 mg Oral QHS    have reviewed scheduled and prn medications.  Physical Exam: General: deconditioned but NAD- pleasant Heart: RRR Lungs: mostly clear  Abdomen: soft, non tender Extremities: some dep edema    06/19/2019,10:15 AM  LOS: 9 days

## 2019-06-19 NOTE — Progress Notes (Signed)
PROGRESS NOTE    Erika Fry  NGE:[id number] DOB: 07-15-1946 DOA: 06/09/2019 PCP: [name], MD  Brief Narrative:  73 y.o.femalewith medical history significant ofhypertension, hyperlipidemia, diet-controlled diabetes, GERD, hypothyroidism, gout, pacemaker placement, iron deficiency anemia, CKD-4, CHF, who presents with bilateral leg edema.  Assessment & Plan:   Principal Problem:   Bilateral leg edema Active Problems:   Chronic kidney disease (CKD), stage IV (severe) (HCC)   Diet-controlled diabetes mellitus (HCC)   GERD (gastroesophageal reflux disease)   High cholesterol   Hypothyroidism   Iron deficiency anemia   Essential hypertension   UTI (urinary tract infection)   Weakness generalized   Chronic diastolic CHF (congestive heart failure) (HCC)   Cellulitis of lower extremity   Acute on chronic diastolic CHF (congestive heart failure) ([place])   Hypomagnesemia   DNR (do not resuscitate) discussion   Cystitis   Pain in both lower extremities   Recurrent bilateral leg edema with cellulitis of the lower extremities:  Completed the course of antibiotics.  Possibly from diabetic neuropathy.  Probably a component of acute diastolic heart failure.  She was diuresed appropriately with IV lasix, but since her creatinine worsened with IV diuresis, lasix was held.  Repeat renal parameters show worsening of creatinine.  Continue to watch creatinine to see if she needs HD. Currently her k is wnl and she does not appear uremic.  Appreciate nephrology recommendations.     Type 2 dm: CBG (last 3)  No results for input(s): GLUCAP in the last 72 hours.  ? Diet controlled.    Acute on STAGE 4 CKD Creatinine worsened with IV diuresis.  Holding lasix and repeat renal parameters in am.   Hypertension: Well controlled.    Hypothyroidism: Resume synthroid.    UTI:  Completed the course of antibiotics.    Constipation:  Resume miralax and colace.    GERD; Stable.    Hyponatremia:  Stable around 131.  Monitor.  Asymptomatic.    Low grade temp, pyuria, : Get urine cultures. She completed a course of antibiotics less than a week ago.    DVT prophylaxis: heparin.  Code Status: full code.  Family Communication: none at bedside.called son and updated on 7/16.   Disposition Plan: pt stable for discharge, awaiting hospital bed to be delivered.   Consultants:   Palliative care.   Nephrology   Procedures: none.   Antimicrobials:  None.   Subjective: She reports some leg pain. No chest pain or sob.    Objective: Vitals:   06/18/19 0615 06/18/19 1422 06/18/19 2159 06/19/19 0535  BP: 133/70 111/63 126/63 (!) 112/58  Pulse: 89 84 81 80  Resp: 14 12 18 19   Temp: 100 F (37.8 C) 99.9 F (37.7 C) 99 F (37.2 C) 98.2 F (36.8 C)  TempSrc: Oral Oral Oral Oral  SpO2: 100% 99% 96% 99%  Weight: 66.4 kg     Height:        Intake/Output Summary (Last 24 hours) at 06/19/2019 1405 Last data filed at 06/19/2019 1130 Gross per 24 hour  Intake 480 ml  Output 1300 ml  Net -820 ml   Filed Weights   06/16/19 0622 06/17/19 0437 06/18/19 0615  Weight: 65.2 kg 66.1 kg 66.4 kg    Examination:  General exam: Appears calm and comfortable , no distress.  Respiratory system: Clear to auscultation. Respiratory effort normal.no wheezing or rhonchi.  Cardiovascular system: S1 & S2 heard, RRR. No JVD,  Gastrointestinal system: Abdomen is soft NT ND BS+  Central nervous system: Alert and oriented. No focal neurological deficits. Extremities: tender lower extremities, legs bandaged.  Skin: No rashes, lesions or ulcers Psychiatry:Mood & affect appropriate.     Data Reviewed: I have personally reviewed following labs and imaging studies  CBC: Recent Labs  Lab 06/13/19 0801  WBC 8.1  HGB 9.5*  HCT 29.9*  MCV 93.7  PLT 676   Basic Metabolic Panel: Recent Labs  Lab 06/13/19 0801 06/15/19 0539 06/17/19 0415  06/18/19 1231 06/18/19 1232 06/19/19 0605  NA 134* 133* 131* 131* 131* 132*  K 3.9 4.0 4.7 4.6 4.6 4.7  CL 98 99 96* 97* 97* 98  CO2 20* 19* 21* 19* 18* 21*  GLUCOSE 103* 115* 103* 154* 153* 111*  BUN 64* 73* 84* 88* 88* 95*  CREATININE 3.59* 4.46* 4.98* 5.21* 5.42* 5.42*  CALCIUM 9.6 9.5 9.7 9.5 9.5 9.8  MG 1.9  --   --   --   --   --   PHOS 4.6 6.1*  --   --  5.8* 6.3*   GFR: Estimated Creatinine Clearance: 8.1 mL/min (A) (by C-G formula based on SCr of 5.42 mg/dL (H)). Liver Function Tests: Recent Labs  Lab 06/13/19 0801 06/15/19 0539 06/18/19 1232 06/19/19 0605  ALBUMIN 3.1* 3.0* 2.6* 2.7*   No results for input(s): LIPASE, AMYLASE in the last 168 hours. No results for input(s): AMMONIA in the last 168 hours. Coagulation Profile: No results for input(s): INR, PROTIME in the last 168 hours. Cardiac Enzymes: No results for input(s): CKTOTAL, CKMB, CKMBINDEX, TROPONINI in the last 168 hours. BNP (last 3 results) No results for input(s): PROBNP in the last 8760 hours. HbA1C: No results for input(s): HGBA1C in the last 72 hours. CBG: No results for input(s): GLUCAP in the last 168 hours. Lipid Profile: No results for input(s): CHOL, HDL, LDLCALC, TRIG, CHOLHDL, LDLDIRECT in the last 72 hours. Thyroid Function Tests: No results for input(s): TSH, T4TOTAL, FREET4, T3FREE, THYROIDAB in the last 72 hours. Anemia Panel: No results for input(s): VITAMINB12, FOLATE, FERRITIN, TIBC, IRON, RETICCTPCT in the last 72 hours. Sepsis Labs: No results for input(s): PROCALCITON, LATICACIDVEN in the last 168 hours.  Recent Results (from the past 240 hour(s))  Urine culture     Status: Abnormal   Collection Time: 06/09/19  5:17 PM   Specimen: Urine, Clean Catch  Result Value Ref Range Status   Specimen Description   Final    URINE, CLEAN CATCH Performed at [facility], [place] [address], [place], [place] [address]    Special Requests   Final    NONE Performed  at [facility], [place] [address], [place], [place] [address]    Culture MULTIPLE SPECIES PRESENT, SUGGEST RECOLLECTION (A)  Final   Report Status 06/10/2019 FINAL  Final  SARS Coronavirus 2 (CEPHEID - Performed in [place] hospital lab), Hosp Order     Status: None   Collection Time: 06/09/19  5:17 PM   Specimen: Nasopharyngeal Swab  Result Value Ref Range Status   SARS Coronavirus 2 NEGATIVE NEGATIVE Final    Comment: (NOTE) If result is NEGATIVE SARS-CoV-2 target nucleic acids are NOT DETECTED. The SARS-CoV-2 RNA is generally detectable in upper and lower  respiratory specimens during the acute phase of infection. The lowest  concentration of SARS-CoV-2 viral copies this assay can detect is 250  copies / mL. A negative result does not preclude SARS-CoV-2 infection  and should not be used as the sole basis for  treatment or other  patient management decisions.  A negative result may occur with  improper specimen collection / handling, submission of specimen other  than nasopharyngeal swab, presence of viral mutation(s) within the  areas targeted by this assay, and inadequate number of viral copies  (<250 copies / mL). A negative result must be combined with clinical  observations, patient history, and epidemiological information. If result is POSITIVE SARS-CoV-2 target nucleic acids are DETECTED. The SARS-CoV-2 RNA is generally detectable in upper and lower  respiratory specimens dur ing the acute phase of infection.  Positive  results are indicative of active infection with SARS-CoV-2.  Clinical  correlation with patient history and other diagnostic information is  necessary to determine patient infection status.  Positive results do  not rule out bacterial infection or co-infection with other viruses. If result is PRESUMPTIVE POSTIVE SARS-CoV-2 nucleic acids MAY BE PRESENT.   A presumptive positive result was obtained on the submitted specimen  and confirmed  on repeat testing.  While 2019 novel coronavirus  (SARS-CoV-2) nucleic acids may be present in the submitted sample  additional confirmatory testing may be necessary for epidemiological  and / or clinical management purposes  to differentiate between  SARS-CoV-2 and other Sarbecovirus currently known to infect humans.  If clinically indicated additional testing with an alternate test  methodology [phone]) is advised. The SARS-CoV-2 RNA is generally  detectable in upper and lower respiratory sp ecimens during the acute  phase of infection. The expected result is Negative. Fact Sheet for Patients:  StrictlyIdeas.no Fact Sheet for Healthcare Providers: BankingDealers.co.za This test is not yet approved or cleared by the [place] FDA and has been authorized for detection and/or diagnosis of SARS-CoV-2 by FDA under an Emergency Use Authorization (EUA).  This EUA will remain in effect (meaning this test can be used) for the duration of the COVID-19 declaration under Section 564(b)(1) of the Act, 21 U.S.C. section 360bbb-3(b)(1), unless the authorization is terminated or revoked sooner. Performed at [facility], [place] [address] [place], [place] [address]   Culture, blood (routine x 2)     Status: None   Collection Time: 06/09/19  5:41 PM   Specimen: BLOOD  Result Value Ref Range Status   Specimen Description   Final    BLOOD RIGHT ANTECUBITAL Performed at [facility] [address] [place], [place] [address]    Special Requests   Final    BOTTLES DRAWN AEROBIC AND ANAEROBIC Blood Culture adequate volume Performed at [place] [address], [place], [place] [address]    Culture   Final    NO GROWTH 5 DAYS Performed at [facility], [place] [address], [place], [place] [address]    Report Status 06/14/2019 FINAL  Final  Culture, blood (routine x 2)     Status: None    Collection Time: 06/09/19  9:58 PM   Specimen: BLOOD  Result Value Ref Range Status   Specimen Description   Final    BLOOD BLOOD LEFT HAND Performed at [place] [address] [place], [place] [address]    Special Requests   Final    BOTTLES DRAWN AEROBIC ONLY Blood Culture adequate volume Performed at [facility] [address] [place], [place] [address]    Culture   Final    NO GROWTH 5 DAYS Performed at [facility], [place] [address], [place], [place] [address]    Report Status  06/14/2019 FINAL  Final         Radiology Studies: No results found.      Scheduled Meds: . docusate sodium  100 mg Oral BID  . ferrous sulfate  325 mg Oral Q breakfast  . gabapentin  100 mg Oral TID  . Gerhardt's butt cream   Topical Daily  . heparin  5,000 Units Subcutaneous Q8H  . levothyroxine  75 mcg Oral QAC breakfast  . loratadine  10 mg Oral Daily  . multivitamin with minerals  1 tablet Oral Daily  . pantoprazole  40 mg Oral Daily  . polyethylene glycol  17 g Oral BID  . senna  1 tablet Oral QHS  . simvastatin  40 mg Oral QHS  . torsemide  100 mg Oral Daily   Continuous Infusions: . sodium chloride       LOS: 9 days    Time spent: 30 minutes.     Hosie Poisson, MD [facility] Pager [phone]  If 7PM-7AM, please contact night-coverage [email or web address] Password [facility] 06/19/2019, 2:05 PM

## 2019-06-20 ENCOUNTER — Inpatient Hospital Stay (HOSPITAL_COMMUNITY): Payer: Medicare Other

## 2019-06-20 LAB — RENAL FUNCTION PANEL
Albumin: 2.6 g/dL — ABNORMAL LOW (ref 3.5–5.0)
Anion gap: 16 — ABNORMAL HIGH (ref 5–15)
BUN: 99 mg/dL — ABNORMAL HIGH (ref 8–23)
CO2: 18 mmol/L — ABNORMAL LOW (ref 22–32)
Calcium: 9.8 mg/dL (ref 8.9–10.3)
Chloride: 97 mmol/L — ABNORMAL LOW (ref 98–111)
Creatinine, Ser: 5.75 mg/dL — ABNORMAL HIGH (ref 0.44–1.00)
GFR calc Af Amer: 8 mL/min — ABNORMAL LOW (ref >60)
GFR calc non Af Amer: 7 mL/min — ABNORMAL LOW (ref >60)
Glucose, Bld: 96 mg/dL (ref 70–99)
Phosphorus: 6 mg/dL — ABNORMAL HIGH (ref 2.5–4.6)
Potassium: 4.8 mmol/L (ref 3.5–5.1)
Sodium: 131 mmol/L — ABNORMAL LOW (ref 135–145)

## 2019-06-20 LAB — CBC
HCT: 25.8 % — ABNORMAL LOW (ref 36.0–46.0)
Hemoglobin: 8.2 g/dL — ABNORMAL LOW (ref 12.0–15.0)
MCH: 29.8 pg (ref 26.0–34.0)
MCHC: 31.8 g/dL (ref 30.0–36.0)
MCV: 93.8 fL (ref 80.0–100.0)
Platelets: 262 10*3/uL (ref 150–400)
RBC: 2.75 MIL/uL — ABNORMAL LOW (ref 3.87–5.11)
RDW: 13.9 % (ref 11.5–15.5)
WBC: 6.9 10*3/uL (ref 4.0–10.5)
nRBC: 0 % (ref 0.0–0.2)

## 2019-06-20 MED ORDER — SODIUM CHLORIDE 0.9 % IV SOLN
1.0000 g | INTRAVENOUS | Status: DC
  Administered 2019-06-20 – 2019-06-22 (×3): 1 g via INTRAVENOUS
  Filled 2019-06-20: qty 10
  Filled 2019-06-20 (×2): qty 1

## 2019-06-20 NOTE — Progress Notes (Signed)
Occupational Therapy Treatment Patient Details Name: Erika Fry MRN: [id number] DOB: 1946-09-13 Today's Date: 06/20/2019    History of present illness 73 y.o. female with medical history significant of hypertension, hyperlipidemia, diet-controlled diabetes, GERD, hypothyroidism, gout, pacemaker placement, iron deficiency anemia, CKD-4, CHF, peripheral neuropathy, and recent R nephrostomy drain placed 04/2019 who presents with bilateral leg edema.   OT comments  Improved toilet hygiene and clothing management today. Pt initially confused about time when looking at menu, but everything else seemed at baseline.   Follow Up Recommendations  Home health OT;Supervision/Assistance - 24 hour    Equipment Recommendations  (per chart, getting hospital bed delivered)    Recommendations for Other Services      Precautions / Restrictions Precautions Precautions: Fall Precaution Comments: large fluid filled blister on bil LEs, LEs very tender to touch Restrictions Weight Bearing Restrictions: No       Mobility Bed Mobility         Supine to sit: Min guard Sit to supine: Min guard   General bed mobility comments: did assist pt with pillow under lower legs and shifted this when she scooted over in bed  Transfers   Equipment used: Rolling walker (2 wheeled)   Sit to Stand: Min guard         General transfer comment: close guard for safety. Pt ambulated to nurse's station before returning to bed    Balance                                           ADL either performed or assessed with clinical judgement   ADL                           Toilet Transfer: Min guard;Stand-pivot;BSC;RW   Toileting- Clothing Manipulation and Hygiene: Minimal assistance         General ADL Comments: Pt used commode and assisted with hygiene and clothing management.  nephrostomy tube/gown in way, but she participated much more     Vision        Perception     Praxis      Cognition Arousal/Alertness: Awake/alert Behavior During Therapy: WFL for tasks assessed/performed                                   General Comments: mostly wfls.  Pt was looking at menu to order lunch at 230.  Told her she was likely trying to look at dinner menu.          Exercises     Shoulder Instructions       General Comments      Pertinent Vitals/ Pain       Pain Assessment: Faces Faces Pain Scale: Hurts whole lot Pain Location: groin when urinating Pain Intervention(s): Limited activity within patient's tolerance;Monitored during session;Repositioned  Home Living                                          Prior Functioning/Environment              Frequency  Min 2X/week        Progress Toward Goals  OT Goals(current goals can now  be found in the care plan section)  Progress towards OT goals: Progressing toward goals     Plan      Co-evaluation                 AM-PAC OT "6 Clicks" Daily Activity     Outcome Measure   Help from another person eating meals?: None Help from another person taking care of personal grooming?: A Little Help from another person toileting, which includes using toliet, bedpan, or urinal?: A Little Help from another person bathing (including washing, rinsing, drying)?: A Lot Help from another person to put on and taking off regular upper body clothing?: A Little Help from another person to put on and taking off regular lower body clothing?: Total 6 Click Score: 16    End of Session    OT Visit Diagnosis: Pain Pain - part of body: Leg(bil)   Activity Tolerance Patient tolerated treatment well   Patient Left in bed;with call bell/phone within reach;with bed alarm set   Nurse Communication          Time: 6222-9798 OT Time Calculation (min): 32 min  Charges: OT General Charges $OT Visit: 1 Visit OT Treatments $Self Care/Home Management :  8-22 mins $Therapeutic Activity: 8-22 mins  Lesle Chris, OTR/L Acute Rehabilitation Services [phone] [facility] pager [phone] office 06/20/2019   [place] 06/20/2019, 3:40 PM

## 2019-06-20 NOTE — Progress Notes (Signed)
PT had a temp of 100.9 when rechecked in 15 minutes it was 100.0 gave pt a tylenol and will continue to monitor. Pt expressed that she normally has a high temp when she awakes from sleep.

## 2019-06-20 NOTE — Progress Notes (Signed)
PROGRESS NOTE    Erika Fry  YBO:[id number] DOB: 1946-04-09 DOA: 06/09/2019 PCP: [name], MD  Brief Narrative:  73 y.o.femalewith medical history significant ofhypertension, hyperlipidemia, diet-controlled diabetes, GERD, hypothyroidism, gout, pacemaker placement, iron deficiency anemia, CKD-4, CHF, who presents with bilateral leg edema.  Assessment & Plan:   Principal Problem:   Bilateral leg edema Active Problems:   Chronic kidney disease (CKD), stage IV (severe) (HCC)   Diet-controlled diabetes mellitus (HCC)   GERD (gastroesophageal reflux disease)   High cholesterol   Hypothyroidism   Iron deficiency anemia   Essential hypertension   UTI (urinary tract infection)   Weakness generalized   Chronic diastolic CHF (congestive heart failure) (HCC)   Cellulitis of lower extremity   Acute on chronic diastolic CHF (congestive heart failure) ([place])   Hypomagnesemia   DNR (do not resuscitate) discussion   Cystitis   Pain in both lower extremities   Recurrent bilateral leg edema with cellulitis of the lower extremities:  Completed the course of antibiotics.  Possibly from diabetic neuropathy.  Probably a component of acute diastolic heart failure.  She was diuresed appropriately with IV lasix, but since her creatinine worsened with IV diuresis, lasix was held. She was restarted on torsemide 100 mg daily.  Repeat renal parameters show worsening of creatinine.  Continue to watch creatinine to see if she needs HD. Currently her k is wnl and she does not appear uremic.  Appreciate nephrology recommendations.     Type 2 dm: CBG (last 3)  No results for input(s): GLUCAP in the last 72 hours.  ? Diet controlled.    Acute on STAGE 4 CKD Creatinine worsened with IV diuresis.  Lasix stopped and she was started on torsemide.   Hypertension: Well controlled.    Hypothyroidism: Resume synthroid.    UTI:  Completed the course of antibiotics.     Constipation:  Resume miralax and colace.   GERD; Stable.    Hyponatremia:  Stable around 131.  Monitor.  Asymptomatic.    Low grade temp, pyuria, : Get urine cultures. She completed a course of antibiotics less than a week ago.  CXR and blood cultures ordered.  She was started on IV rocephin. Monitor.   DVT prophylaxis: heparin.  Code Status: full code.  Family Communication: none at bedside.called son and updated on 7/16.   Disposition Plan: pt stable for discharge, awaiting hospital bed to be delivered.   Consultants:   Palliative care.   Nephrology   Procedures: none.   Antimicrobials:  None.   Subjective: No chest pain or abd pain,  Wants to go home.    Objective: Vitals:   06/19/19 2159 06/19/19 2237 06/19/19 2354 06/20/19 0614  BP: (!) 111/55   99/66  Pulse: 74   82  Resp: 20   18  Temp: 100 F (37.8 C) (!) 100.4 F (38 C) 98.4 F (36.9 C) 98.4 F (36.9 C)  TempSrc: Oral Oral Oral   SpO2: 95%   98%  Weight:    67 kg  Height:        Intake/Output Summary (Last 24 hours) at 06/20/2019 1241 Last data filed at 06/20/2019 1144 Gross per 24 hour  Intake 360 ml  Output 1020 ml  Net -660 ml   Filed Weights   06/17/19 0437 06/18/19 0615 06/20/19 0614  Weight: 66.1 kg 66.4 kg 67 kg    Examination:  General exam: ingood spirits, calm and comfortable.  Respiratory system: diminished at bases, no wheezing or rhonchi.  Cardiovascular system: S1 & S2 heard, RRR. No JVD,  Gastrointestinal system: Abdomen is soft NT ND BS+ Central nervous system: Alert and oriented. Non focal.  Extremities: tender lower extremities, legs bandaged.  Skin: No rashes, lesions or ulcers Psychiatry:Mood & affect appropriate.     Data Reviewed: I have personally reviewed following labs and imaging studies  CBC: Recent Labs  Lab 06/20/19 1032  WBC 6.9  HGB 8.2*  HCT 25.8*  MCV 93.8  PLT 960   Basic Metabolic Panel: Recent Labs  Lab 06/15/19 0539  06/17/19 0415 06/18/19 1231 06/18/19 1232 06/19/19 0605 06/20/19 0535  NA 133* 131* 131* 131* 132* 131*  K 4.0 4.7 4.6 4.6 4.7 4.8  CL 99 96* 97* 97* 98 97*  CO2 19* 21* 19* 18* 21* 18*  GLUCOSE 115* 103* 154* 153* 111* 96  BUN 73* 84* 88* 88* 95* 99*  CREATININE 4.46* 4.98* 5.21* 5.42* 5.42* 5.75*  CALCIUM 9.5 9.7 9.5 9.5 9.8 9.8  PHOS 6.1*  --   --  5.8* 6.3* 6.0*   GFR: Estimated Creatinine Clearance: 7.6 mL/min (A) (by C-G formula based on SCr of 5.75 mg/dL (H)). Liver Function Tests: Recent Labs  Lab 06/15/19 0539 06/18/19 1232 06/19/19 0605 06/20/19 0535  ALBUMIN 3.0* 2.6* 2.7* 2.6*   No results for input(s): LIPASE, AMYLASE in the last 168 hours. No results for input(s): AMMONIA in the last 168 hours. Coagulation Profile: No results for input(s): INR, PROTIME in the last 168 hours. Cardiac Enzymes: No results for input(s): CKTOTAL, CKMB, CKMBINDEX, TROPONINI in the last 168 hours. BNP (last 3 results) No results for input(s): PROBNP in the last 8760 hours. HbA1C: No results for input(s): HGBA1C in the last 72 hours. CBG: No results for input(s): GLUCAP in the last 168 hours. Lipid Profile: No results for input(s): CHOL, HDL, LDLCALC, TRIG, CHOLHDL, LDLDIRECT in the last 72 hours. Thyroid Function Tests: No results for input(s): TSH, T4TOTAL, FREET4, T3FREE, THYROIDAB in the last 72 hours. Anemia Panel: No results for input(s): VITAMINB12, FOLATE, FERRITIN, TIBC, IRON, RETICCTPCT in the last 72 hours. Sepsis Labs: No results for input(s): PROCALCITON, LATICACIDVEN in the last 168 hours.  No results found for this or any previous visit (from the past 240 hour(s)).       Radiology Studies: Dg Chest 2 View  Result Date: 06/20/2019 CLINICAL DATA:  Fever. EXAM: CHEST - 2 VIEW COMPARISON:  Radiographs of February 10, 2019. FINDINGS: Stable cardiomegaly. Left-sided pacemaker is unchanged in position. No pneumothorax is noted. Right lung is clear. Mild left basilar  atelectasis or infiltrate is noted with possible pleural effusion. Bony thorax unremarkable. IMPRESSION: Mild left basilar subsegmental atelectasis or infiltrate is noted with possible left pleural effusion. Aortic Atherosclerosis (ICD10-I70.0). Electronically Signed   By: Marijo Conception M.D.   On: 06/20/2019 11:39        Scheduled Meds: . docusate sodium  100 mg Oral BID  . ferrous sulfate  325 mg Oral Q breakfast  . gabapentin  100 mg Oral TID  . Gerhardt's butt cream   Topical Daily  . heparin  5,000 Units Subcutaneous Q8H  . levothyroxine  75 mcg Oral QAC breakfast  . loratadine  10 mg Oral Daily  . multivitamin with minerals  1 tablet Oral Daily  . pantoprazole  40 mg Oral Daily  . polyethylene glycol  17 g Oral BID  . senna  1 tablet Oral QHS  . simvastatin  40 mg Oral QHS  . torsemide  100 mg Oral Daily   Continuous Infusions: . sodium chloride    . cefTRIAXone (ROCEPHIN)  IV 1 g (06/20/19 1223)     LOS: 10 days    Time spent: 30 minutes.     Hosie Poisson, MD [facility] Pager [phone]  If 7PM-7AM, please contact night-coverage [email or web address] Password [facility] 06/20/2019, 12:41 PM

## 2019-06-21 ENCOUNTER — Encounter (HOSPITAL_COMMUNITY)

## 2019-06-21 LAB — URINE CULTURE

## 2019-06-21 LAB — CBC WITH DIFFERENTIAL/PLATELET
Abs Immature Granulocytes: 0.03 10*3/uL (ref 0.00–0.07)
Basophils Absolute: 0 10*3/uL (ref 0.0–0.1)
Basophils Relative: 1 %
Eosinophils Absolute: 0.2 10*3/uL (ref 0.0–0.5)
Eosinophils Relative: 3 %
HCT: 28.9 % — ABNORMAL LOW (ref 36.0–46.0)
Hemoglobin: 8.8 g/dL — ABNORMAL LOW (ref 12.0–15.0)
Immature Granulocytes: 1 %
Lymphocytes Relative: 16 %
Lymphs Abs: 0.9 10*3/uL (ref 0.7–4.0)
MCH: 28.9 pg (ref 26.0–34.0)
MCHC: 30.4 g/dL (ref 30.0–36.0)
MCV: 94.8 fL (ref 80.0–100.0)
Monocytes Absolute: 0.8 10*3/uL (ref 0.1–1.0)
Monocytes Relative: 14 %
Neutro Abs: 3.9 10*3/uL (ref 1.7–7.7)
Neutrophils Relative %: 65 %
Platelets: 335 10*3/uL (ref 150–400)
RBC: 3.05 MIL/uL — ABNORMAL LOW (ref 3.87–5.11)
RDW: 13.9 % (ref 11.5–15.5)
WBC: 5.9 10*3/uL (ref 4.0–10.5)
nRBC: 0 % (ref 0.0–0.2)

## 2019-06-21 LAB — BASIC METABOLIC PANEL
Anion gap: 20 — ABNORMAL HIGH (ref 5–15)
BUN: 111 mg/dL — ABNORMAL HIGH (ref 8–23)
CO2: 15 mmol/L — ABNORMAL LOW (ref 22–32)
Calcium: 9.7 mg/dL (ref 8.9–10.3)
Chloride: 98 mmol/L (ref 98–111)
Creatinine, Ser: 5.93 mg/dL — ABNORMAL HIGH (ref 0.44–1.00)
GFR calc Af Amer: 7 mL/min — ABNORMAL LOW (ref >60)
GFR calc non Af Amer: 6 mL/min — ABNORMAL LOW (ref >60)
Glucose, Bld: 135 mg/dL — ABNORMAL HIGH (ref 70–99)
Potassium: 5.1 mmol/L (ref 3.5–5.1)
Sodium: 133 mmol/L — ABNORMAL LOW (ref 135–145)

## 2019-06-21 MED ORDER — ENSURE ENLIVE PO LIQD
237.0000 mL | ORAL | Status: DC
  Administered 2019-06-21 – 2019-06-27 (×7): 237 mL via ORAL

## 2019-06-21 MED ORDER — PRO-STAT SUGAR FREE PO LIQD
30.0000 mL | Freq: Every day | ORAL | Status: DC
  Administered 2019-06-21 – 2019-06-23 (×2): 30 mL via ORAL
  Filled 2019-06-21 (×6): qty 30

## 2019-06-21 NOTE — Progress Notes (Signed)
A call to pt's daughter in-law, son with no answer VM was left. Pt's daughter in-law Angie returned CM's call. States that everyone is at work. Angies's [phone] number was given to [employer] for delivery of bed.

## 2019-06-21 NOTE — Progress Notes (Signed)
Patient ID: Erika Fry, female   DOB: 20-Sep-1946, 73 y.o.   MRN: [id number]  This NP visited patient at the bedside as a follow up to for palliative medicine needs and emotional support.  Patient remains weak and deconditioned, currently lying in bed.  Discussed with patient the importance of self motivated exercise and mobility and sitting up in the chair during the day.  Again we discussed anticipatory care needs when patient is discharged.   Plan is for discharge home today or tomorrow.    Recommend Palliative OP community based services. Family spoke last week about adding in home caregivers.  Discussed patient's declining kidney function, she is open to dialysis.  I encouraged patient to consider her increased care needs now and into the future.  Plan of care: -Patient remains full code--encourage patient and family to consider DNR/DNI status knowing poor outcomes in similar patients -Patient and family are open to all offered and available medical interventions to prolong life, all remain hopeful for improvement -Transition home with home health and home rehab -Recommend outpatient community-based palliative care services -Family plans to implement care plan including family involvement in the home and out-of-pocket caregivers  Discussed with patient the importance of continued conversation with her family and their  medical providers regarding overall plan of care and treatment options,  ensuring decisions are within the context of the patients values and GOCs.  Patient is high risk for decompensation  Questions and concerns addressed   Discussed with Dr Karleen Hampshire  Total time spent on the unit was 25 minutes  Greater than 50% of the time was spent in counseling and coordination of care  Wadie Lessen NP  Palliative Medicine Team Team Phone # [phone] Pager [phone]

## 2019-06-21 NOTE — Progress Notes (Signed)
Initial Nutrition Assessment  RD working remotely.   DOCUMENTATION CODES:   Not applicable  INTERVENTION:  - will order Ensure Enlive once/day, each supplement provides 350 kcal and 20 grams of protein. - will order 30 mL Prostat once/day, each supplement provides 100 kcal and 15 grams of protein. - continue to encourage PO intakes.    NUTRITION DIAGNOSIS:   Increased nutrient needs related to acute illness as evidenced by estimated needs.  GOAL:   Patient will meet greater than or equal to 90% of their needs  MONITOR:   PO intake, Supplement acceptance, Labs, Weight trends, I & O's  REASON FOR ASSESSMENT:   LOS(day #11)  ASSESSMENT:   73 y.o. female with medical history significant of HTN, hyperlipidemia, diet-controlled DM, GERD, hypothyroidism, gout, pacemaker placement, iron deficiency anemia, CKD-4, and CHF. She presented to the ED with bilateral leg edema.  Per RN flow sheet, patient consumed the following at recent meals: 7/16- 50% of breakfast (250 kcal, 13 grams protein) 7/17- 75% of breakfast and dinner (total of 996 kcal, 41 grams protein) 7/19- 75% of breakfast and lunch, 100% of dinner (total of 1470 kcal, 72 grams protein)  Per chart review, current weight is 154 lb and weight has been fluctuating (142-154 lb) since admission on 7/8. Will order ONS as outlined above to aid in meeting estimated needs.  Per notes: - UTI--has completed course of abx - constipation with bowel regimen ordered - yesterday MD note indicated patient is stable for d/c but awaiting hospital bed for home   Labs reviewed; Na: 131 mmol/l, Cl: 97 mmol/l, BUN: 99 mg/dl, creatinine: 5.75 mg/dl, Phos: 6 mg/dl, GFR: 8 ml/min. Medications reviewed; 100 mg colace BID, 325 mg ferrous sulfate/day, 75 mcg synthroid/day, daily multivitamin with minerals, 1 packet miralax BID, 1 tablet senokot/day.     NUTRITION - FOCUSED PHYSICAL EXAM:  unable to complete at this time.  Diet Order:    Diet Order            Diet - low sodium heart healthy        Diet 2 gram sodium Room service appropriate? Yes; Fluid consistency: Thin  Diet effective now              EDUCATION NEEDS:   No education needs have been identified at this time  Skin:  Skin Assessment: Reviewed RN Assessment  Last BM:  7/20  Height:   Ht Readings from Last 1 Encounters:  06/10/19 5\' 5"  (1.651 m)    Weight:   Wt Readings from Last 1 Encounters:  06/21/19 69.9 kg    Ideal Body Weight:  56.8 kg  BMI:  Body mass index is 25.64 kg/m.  Estimated Nutritional Needs:   Kcal:  1750-1960 kcal  Protein:  70-85 grams  Fluid:  >/= 1.5 L/day      Jarome Matin, MS, RD, LDN, [facility] Inpatient Clinical Dietitian Pager # [phone] After hours/weekend pager # [phone]

## 2019-06-21 NOTE — Progress Notes (Signed)
Physical Therapy Treatment Patient Details Name: Erika Fry MRN: [id number] DOB: 03-21-1946 Today's Date: 06/21/2019    History of Present Illness 73 y.o. female with medical history significant of hypertension, hyperlipidemia, diet-controlled diabetes, GERD, hypothyroidism, gout, pacemaker placement, iron deficiency anemia, CKD-4, CHF, peripheral neuropathy, and recent R nephrostomy drain placed 04/2019 who presents with bilateral leg edema.    PT Comments    Pt assisted to [facility] first per request and then ambulated in hallway.  Pt reports LE pain limiting mobility.  Pt's mobility is slow and effortful however requiring min assist at this time.  Pt requesting CIR screen since she states she needs dialysis and would like to rehab at [facility] (states she has been here before).  Pt reports she can tolerate therapy and will perform any expectations.  Will place screen order.   Follow Up Recommendations  CIR     Equipment Recommendations  None recommended by PT    Recommendations for Other Services       Precautions / Restrictions Precautions Precautions: Fall Precaution Comments: large fluid filled blister on bil LEs, LEs very tender to touch    Mobility  Bed Mobility Overal bed mobility: Needs Assistance Bed Mobility: Supine to Sit;Sit to Supine     Supine to sit: Min guard;HOB elevated Sit to supine: Min guard   General bed mobility comments: increased time and effort with LEs, pt self assisted  Transfers Overall transfer level: Needs assistance Equipment used: Rolling walker (2 wheeled) Transfers: Sit to/from [facility] Sit to Stand: Min assist Stand pivot transfers: Min assist       General transfer comment: assist for steadying with rise, cues for hand placement, pt used BSC prior to ambulating  Ambulation/Gait Ambulation/Gait assistance: Min assist Gait Distance (Feet): 40 Feet Assistive device: Rolling walker (2 wheeled) Gait  Pattern/deviations: Step-through pattern;Decreased stride length     General Gait Details: assist for steadying, pt determined to ambulate improved distance today, reports LE pain limiting   Stairs             Wheelchair Mobility    Modified Rankin (Stroke Patients Only)       Balance           Standing balance support: Bilateral upper extremity supported Standing balance-Leahy Scale: Poor Standing balance comment: requires UE Support                            Cognition Arousal/Alertness: Awake/alert Behavior During Therapy: WFL for tasks assessed/performed Overall Cognitive Status: Within Functional Limits for tasks assessed                                        Exercises      General Comments        Pertinent Vitals/Pain Pain Assessment: Faces Faces Pain Scale: Hurts even more Pain Location: dependent LEs Pain Descriptors / Indicators: Sore;Tender Pain Intervention(s): Limited activity within patient's tolerance;Monitored during session;Repositioned    Home Living                      Prior Function            PT Goals (current goals can now be found in the care plan section) Progress towards PT goals: Progressing toward goals    Frequency    Min 3X/week  PT Plan Discharge plan needs to be updated    Co-evaluation              AM-PAC PT "6 Clicks" Mobility   Outcome Measure  Help needed turning from your back to your side while in a flat bed without using bedrails?: A Little Help needed moving from lying on your back to sitting on the side of a flat bed without using bedrails?: A Little Help needed moving to and from a bed to a chair (including a wheelchair)?: A Little Help needed standing up from a chair using your arms (e.g., wheelchair or bedside chair)?: A Little Help needed to walk in hospital room?: A Lot Help needed climbing 3-5 steps with a railing? : Total 6 Click Score:  15    End of Session Equipment Utilized During Treatment: Gait belt Activity Tolerance: Patient limited by pain Patient left: in bed;with bed alarm set;with call bell/phone within reach Nurse Communication: Mobility status PT Visit Diagnosis: Muscle weakness (generalized) (M62.81);Difficulty in walking, not elsewhere classified (R26.2)     Time: 1410-1445 PT Time Calculation (min) (ACUTE ONLY): 35 min  Charges:  $Gait Training: 8-22 mins $Therapeutic Activity: 8-22 mins                    Carmelia Bake, PT, DPT Acute Rehabilitation Services Office: [phone] Pager: [phone]  Trena Platt 06/21/2019, 3:12 PM

## 2019-06-21 NOTE — Progress Notes (Signed)
PROGRESS NOTE    Erika Fry  LEX:[id number] DOB: Jun 18, 1946 DOA: 06/09/2019 PCP: [name], MD  Brief Narrative:  73 y.o.femalewith medical history significant ofhypertension, hyperlipidemia, diet-controlled diabetes, GERD, hypothyroidism, gout, pacemaker placement, iron deficiency anemia, CKD-4, CHF, who presents with bilateral leg edema.  Assessment & Plan:   Principal Problem:   Bilateral leg edema Active Problems:   Chronic kidney disease (CKD), stage IV (severe) (HCC)   Diet-controlled diabetes mellitus (HCC)   GERD (gastroesophageal reflux disease)   High cholesterol   Hypothyroidism   Iron deficiency anemia   Essential hypertension   UTI (urinary tract infection)   Weakness generalized   Chronic diastolic CHF (congestive heart failure) (HCC)   Cellulitis of lower extremity   Acute on chronic diastolic CHF (congestive heart failure) ([facility])   Hypomagnesemia   DNR (do not resuscitate) discussion   Cystitis   Pain in both lower extremities   Recurrent bilateral leg edema with cellulitis of the lower extremities:  Completed the course of antibiotics.  Possibly from diabetic neuropathy.  Probably a component of acute diastolic heart failure.  She was diuresed appropriately with IV lasix, but since her creatinine worsened with IV diuresis, lasix was held. She was restarted on torsemide 100 mg daily.  Repeat renal parameters show worsening of creatinine.  Continue to watch creatinine to see if she needs HD. Currently her k is wnl and she does not appear uremic.  Appreciate nephrology recommendations.     Type 2 dm: CBG (last 3)  No results for input(s): GLUCAP in the last 72 hours.  ? Diet controlled.    Acute on STAGE 4 CKD Creatinine worsened with IV diuresis.  Lasix stopped and she was started on torsemide. But in view of the worsening renal parameters, we will reconsult nephrology in am.   Hypertension: Well controlled.     Hypothyroidism: Resume synthroid.    UTI:  Completed the course of antibiotics.    Constipation:  Resume miralax and colace.   GERD; Stable.    Hyponatremia:  Stable around 131.  Monitor.  Asymptomatic.    Low grade temp, pyuria, : Urine cultures show multiple bacterial morphotypes. Get clean catch urine cultures today.   CXR and blood cultures ordered. CXR is negative for acute pneumonia. No sob or chest pain or cough.  Blood cultures are negative.  She was started on IV rocephin. Monitor.   DVT prophylaxis: heparin.  Code Status: full code.  Family Communication: none at bedside.called son and updated on 7/18.   Disposition Plan: pending CIR.   Consultants:   Palliative care.   Nephrology   Procedures: none.   Antimicrobials:  None.   Subjective: Patient wants to go to inpatient rehab, and does not want to go home.   Objective: Vitals:   06/20/19 2153 06/20/19 2232 06/21/19 0700 06/21/19 1355  BP: (!) 105/50  (!) 101/59 121/69  Pulse: 63  70 86  Resp: 18  18 16   Temp: (!) 100.9 F (38.3 C) 100 F (37.8 C) 98.3 F (36.8 C) 98.3 F (36.8 C)  TempSrc: Oral Oral Oral Oral  SpO2: 96%  95% (!) 72%  Weight:   69.9 kg   Height:        Intake/Output Summary (Last 24 hours) at 06/21/2019 1648 Last data filed at 06/21/2019 1400 Gross per 24 hour  Intake 897 ml  Output -  Net 897 ml   Filed Weights   06/18/19 0615 06/20/19 0614 06/21/19 0700  Weight: 66.4 kg  67 kg 69.9 kg    Examination:  General exam: in good spirits, calm and comfortable.  Respiratory system: diminished at bases, no wheezing or rhonchi. AIR ENTRY FAIR.  Cardiovascular system: S1 & S2 heard, RRR. No JVD,  Gastrointestinal system: Abdomen is soft NT ND BS+ Central nervous system: Alert and oriented. Non focal.  Extremities: tender lower extremities, legs bandaged.  Skin: No rashes, lesions or ulcers Psychiatry:Mood & affect appropriate.     Data Reviewed: I have  personally reviewed following labs and imaging studies  CBC: Recent Labs  Lab 06/20/19 1032 06/21/19 1515  WBC 6.9 5.9  NEUTROABS  --  3.9  HGB 8.2* 8.8*  HCT 25.8* 28.9*  MCV 93.8 94.8  PLT 262 417   Basic Metabolic Panel: Recent Labs  Lab 06/15/19 0539  06/18/19 1231 06/18/19 1232 06/19/19 0605 06/20/19 0535 06/21/19 1515  NA 133*   < > 131* 131* 132* 131* 133*  K 4.0   < > 4.6 4.6 4.7 4.8 5.1  CL 99   < > 97* 97* 98 97* 98  CO2 19*   < > 19* 18* 21* 18* 15*  GLUCOSE 115*   < > 154* 153* 111* 96 135*  BUN 73*   < > 88* 88* 95* 99* 111*  CREATININE 4.46*   < > 5.21* 5.42* 5.42* 5.75* 5.93*  CALCIUM 9.5   < > 9.5 9.5 9.8 9.8 9.7  PHOS 6.1*  --   --  5.8* 6.3* 6.0*  --    < > = values in this interval not displayed.   GFR: Estimated Creatinine Clearance: 8 mL/min (A) (by C-G formula based on SCr of 5.93 mg/dL (H)). Liver Function Tests: Recent Labs  Lab 06/15/19 0539 06/18/19 1232 06/19/19 0605 06/20/19 0535  ALBUMIN 3.0* 2.6* 2.7* 2.6*   No results for input(s): LIPASE, AMYLASE in the last 168 hours. No results for input(s): AMMONIA in the last 168 hours. Coagulation Profile: No results for input(s): INR, PROTIME in the last 168 hours. Cardiac Enzymes: No results for input(s): CKTOTAL, CKMB, CKMBINDEX, TROPONINI in the last 168 hours. BNP (last 3 results) No results for input(s): PROBNP in the last 8760 hours. HbA1C: No results for input(s): HGBA1C in the last 72 hours. CBG: No results for input(s): GLUCAP in the last 168 hours. Lipid Profile: No results for input(s): CHOL, HDL, LDLCALC, TRIG, CHOLHDL, LDLDIRECT in the last 72 hours. Thyroid Function Tests: No results for input(s): TSH, T4TOTAL, FREET4, T3FREE, THYROIDAB in the last 72 hours. Anemia Panel: No results for input(s): VITAMINB12, FOLATE, FERRITIN, TIBC, IRON, RETICCTPCT in the last 72 hours. Sepsis Labs: No results for input(s): PROCALCITON, LATICACIDVEN in the last 168 hours.  Recent  Results (from the past 240 hour(s))  Culture, Urine     Status: Abnormal   Collection Time: 06/20/19  9:14 AM   Specimen: Urine, Random  Result Value Ref Range Status   Specimen Description   Final    URINE, RANDOM Performed at [facility] [address], [place], [place] [address]    Special Requests   Final    NONE Performed at [facility], [place] [address] [place], [place] [address]    Culture MULTIPLE SPECIES PRESENT, SUGGEST RECOLLECTION (A)  Final   Report Status 06/21/2019 FINAL  Final  Culture, blood (Routine X 2) w Reflex to ID Panel     Status: None (Preliminary result)   Collection Time: 06/20/19 10:30 AM   Specimen: Right Antecubital;  Blood  Result Value Ref Range Status   Specimen Description   Final    RIGHT ANTECUBITAL Performed at [facility] [address], [place], [place] [address]    Special Requests   Final    BOTTLES DRAWN AEROBIC ONLY Blood Culture adequate volume Performed at [facility] [address] [place], [place] [address]    Culture   Final    NO GROWTH < 24 HOURS Performed at [facility] [address], [place], [place] [address]    Report Status PENDING  Incomplete  Culture, blood (Routine X 2) w Reflex to ID Panel     Status: None (Preliminary result)   Collection Time: 06/20/19 10:30 AM   Specimen: Left Antecubital; Blood  Result Value Ref Range Status   Specimen Description   Final    LEFT ANTECUBITAL Performed at [facility] [address] [place], [place] [address]    Special Requests   Final    BOTTLES DRAWN AEROBIC ONLY Blood Culture adequate volume Performed at [facility] [address] [place], [place] [address]    Culture   Final    NO GROWTH < 24 HOURS Performed at [facility] [address] [place], [place] [address]    Report Status PENDING  Incomplete         Radiology  Studies: Dg Chest 2 View  Result Date: 06/20/2019 CLINICAL DATA:  Fever. EXAM: CHEST - 2 VIEW COMPARISON:  Radiographs of February 10, 2019. FINDINGS: Stable cardiomegaly. Left-sided pacemaker is unchanged in position. No pneumothorax is noted. Right lung is clear. Mild left basilar atelectasis or infiltrate is noted with possible pleural effusion. Bony thorax unremarkable. IMPRESSION: Mild left basilar subsegmental atelectasis or infiltrate is noted with possible left pleural effusion. Aortic Atherosclerosis (ICD10-I70.0). Electronically Signed   By: Marijo Conception M.D.   On: 06/20/2019 11:39        Scheduled Meds: . docusate sodium  100 mg Oral BID  . feeding supplement (ENSURE ENLIVE)  237 mL Oral Q24H  . feeding supplement (PRO-STAT SUGAR FREE 64)  30 mL Oral Daily  . ferrous sulfate  325 mg Oral Q breakfast  . gabapentin  100 mg Oral TID  . Gerhardt's butt cream   Topical Daily  . heparin  5,000 Units Subcutaneous Q8H  . levothyroxine  75 mcg Oral QAC breakfast  . loratadine  10 mg Oral Daily  . multivitamin with minerals  1 tablet Oral Daily  . pantoprazole  40 mg Oral Daily  . polyethylene glycol  17 g Oral BID  . senna  1 tablet Oral QHS  . simvastatin  40 mg Oral QHS   Continuous Infusions: . sodium chloride    . cefTRIAXone (ROCEPHIN)  IV 1 g (06/21/19 1036)     LOS: 11 days    Time spent: 30 minutes.     Hosie Poisson, MD [facility] Pager [phone]  If 7PM-7AM, please contact night-coverage [email or web address] Password [facility] 06/21/2019, 4:48 PM

## 2019-06-21 NOTE — Care Management Important Message (Signed)
Important Message  Patient Details IM Letter given to Cookie McGibboney RN to present to the Patient Name: Erika Fry MRN: [id number] Date of Birth: [birth year]   Medicare Important Message Given:  Yes     Kerin Salen 06/21/2019, 10:02 AM

## 2019-06-22 LAB — CBC WITH DIFFERENTIAL/PLATELET
Abs Immature Granulocytes: 0.04 10*3/uL (ref 0.00–0.07)
Basophils Absolute: 0.1 10*3/uL (ref 0.0–0.1)
Basophils Relative: 1 %
Eosinophils Absolute: 0.2 10*3/uL (ref 0.0–0.5)
Eosinophils Relative: 3 %
HCT: 27.8 % — ABNORMAL LOW (ref 36.0–46.0)
Hemoglobin: 8.6 g/dL — ABNORMAL LOW (ref 12.0–15.0)
Immature Granulocytes: 1 %
Lymphocytes Relative: 18 %
Lymphs Abs: 1.1 10*3/uL (ref 0.7–4.0)
MCH: 29.1 pg (ref 26.0–34.0)
MCHC: 30.9 g/dL (ref 30.0–36.0)
MCV: 93.9 fL (ref 80.0–100.0)
Monocytes Absolute: 0.8 10*3/uL (ref 0.1–1.0)
Monocytes Relative: 13 %
Neutro Abs: 4.1 10*3/uL (ref 1.7–7.7)
Neutrophils Relative %: 64 %
Platelets: 348 10*3/uL (ref 150–400)
RBC: 2.96 MIL/uL — ABNORMAL LOW (ref 3.87–5.11)
RDW: 14 % (ref 11.5–15.5)
WBC: 6.3 10*3/uL (ref 4.0–10.5)
nRBC: 0 % (ref 0.0–0.2)

## 2019-06-22 MED ORDER — SODIUM CHLORIDE 0.9 % IV SOLN
INTRAVENOUS | Status: DC
  Administered 2019-06-22 – 2019-06-24 (×3): via INTRAVENOUS

## 2019-06-22 MED ORDER — CEPHALEXIN 250 MG PO CAPS
250.0000 mg | ORAL_CAPSULE | Freq: Every day | ORAL | Status: AC
  Administered 2019-06-23 – 2019-06-25 (×3): 250 mg via ORAL
  Filled 2019-06-22 (×3): qty 1

## 2019-06-22 NOTE — Progress Notes (Signed)
Subjective:  No real c/o's, legs hurt all the time.  BUN/ Cr cont to rise  Objective Vital signs in last 24 hours: Vitals:   06/21/19 1355 06/21/19 2018 06/22/19 0428 06/22/19 1255  BP: 121/69 115/62 112/65 111/61  Pulse: 86 88 87 73  Resp: 16 18 18 18   Temp: 98.3 F (36.8 C) 100.2 F (37.9 C) 99.1 F (37.3 C) (!) 97.5 F (36.4 C)  TempSrc: Oral Oral Oral Oral  SpO2: (!) 72% 96% 97% 99%  Weight:      Height:       Weight change:   Intake/Output Summary (Last 24 hours) at 06/22/2019 1616 Last data filed at 06/22/2019 1101 Gross per 24 hour  Intake 740 ml  Output 750 ml  Net -10 ml    Assessment/ Plan: Pt is a 73 y.o. yo female with late stage CKD and quite a bit of lability with persistent problems of volume status and diuretic management who presented with hypervolemia and lower extremity edema 7 days after being in our office with overdiuresis.  It appears that she has clinically improved but over the past few days creatinine has worsened  Assessment/Plan: 1. Advanced CKD but labile and dep on volume status.  Pt does not seem uremic so no needs for dialysis although she is open to it if needed.  Acute rise in B/Cr after recent inpt diuresis, will start IVF"s and see if can correct this rise.   2. HTN/vol- doubt wt's are accurate, looks euvolemic, possibly dry on exam 3. Anemia- stable in the 9's  4.  Deconditioning- chronic issue- PT/OT   Kelly Splinter, MD 06/22/2019, 4:19 PM   Labs: Basic Metabolic Panel: Recent Labs  Lab 06/18/19 1232 06/19/19 0605 06/20/19 0535 06/21/19 1515  NA 131* 132* 131* 133*  K 4.6 4.7 4.8 5.1  CL 97* 98 97* 98  CO2 18* 21* 18* 15*  GLUCOSE 153* 111* 96 135*  BUN 88* 95* 99* 111*  CREATININE 5.42* 5.42* 5.75* 5.93*  CALCIUM 9.5 9.8 9.8 9.7  PHOS 5.8* 6.3* 6.0*  --    Liver Function Tests: Recent Labs  Lab 06/18/19 1232 06/19/19 0605 06/20/19 0535  ALBUMIN 2.6* 2.7* 2.6*   No results for input(s): LIPASE, AMYLASE in the last  168 hours. No results for input(s): AMMONIA in the last 168 hours. CBC: Recent Labs  Lab 06/20/19 1032 06/21/19 1515 06/22/19 1523  WBC 6.9 5.9 6.3  NEUTROABS  --  3.9 4.1  HGB 8.2* 8.8* 8.6*  HCT 25.8* 28.9* 27.8*  MCV 93.8 94.8 93.9  PLT 262 335 348   Cardiac Enzymes: No results for input(s): CKTOTAL, CKMB, CKMBINDEX, TROPONINI in the last 168 hours. CBG: No results for input(s): GLUCAP in the last 168 hours.  Iron Studies: No results for input(s): IRON, TIBC, TRANSFERRIN, FERRITIN in the last 72 hours. Studies/Results: No results found. Medications: Infusions: . sodium chloride    . sodium chloride 75 mL/hr at 06/22/19 1543  . cefTRIAXone (ROCEPHIN)  IV 1 g (06/22/19 1545)    Scheduled Medications: . docusate sodium  100 mg Oral BID  . feeding supplement (ENSURE ENLIVE)  237 mL Oral Q24H  . feeding supplement (PRO-STAT SUGAR FREE 64)  30 mL Oral Daily  . ferrous sulfate  325 mg Oral Q breakfast  . gabapentin  100 mg Oral TID  . Gerhardt's butt cream   Topical Daily  . heparin  5,000 Units Subcutaneous Q8H  . levothyroxine  75 mcg Oral QAC breakfast  .  loratadine  10 mg Oral Daily  . multivitamin with minerals  1 tablet Oral Daily  . pantoprazole  40 mg Oral Daily  . polyethylene glycol  17 g Oral BID  . senna  1 tablet Oral QHS  . simvastatin  40 mg Oral QHS    have reviewed scheduled and prn medications.  Physical Exam: General: deconditioned but NAD- pleasant and Ox 3 Heart: RRR Lungs: are clear  Abdomen: soft, non tender Extremities: wrinkling skin bilat feet w/o edema , pretib min edema, no thigh edema    06/22/2019,4:16 PM  LOS: 12 days

## 2019-06-22 NOTE — Progress Notes (Signed)
Occupational Therapy Treatment Patient Details Name: Erika Fry MRN: [id number] DOB: 01-24-1946 Today's Date: 06/22/2019    History of present illness 73 y.o. female with medical history significant of hypertension, hyperlipidemia, diet-controlled diabetes, GERD, hypothyroidism, gout, pacemaker placement, iron deficiency anemia, CKD-4, CHF, peripheral neuropathy, and recent R nephrostomy drain placed 04/2019 who presents with bilateral leg edema.   OT comments  Pt progressing towards acute OT goals. Pt completed household distance functional mobility with extra time, multiple standing rest breaks, min A as pt fatigued. Updated d/c recommendation to CIR consult.    Follow Up Recommendations  CIR;Supervision/Assistance - 24 hour    Equipment Recommendations  None recommended by OT    Recommendations for Other Services      Precautions / Restrictions Precautions Precautions: Fall Precaution Comments: large fluid filled blister on bil LEs, LEs very tender to touch Restrictions Weight Bearing Restrictions: No       Mobility Bed Mobility Overal bed mobility: Needs Assistance Bed Mobility: Supine to Sit;Sit to Supine     Supine to sit: Min guard;HOB elevated Sit to supine: Min guard   General bed mobility comments: increased time and effort with LEs, pt self assisted  Transfers Overall transfer level: Needs assistance Equipment used: Rolling walker (2 wheeled) Transfers: Sit to/from Stand Sit to Stand: Min assist         General transfer comment: assist for steadying with rise, cues for hand placement.    Balance Overall balance assessment: Needs assistance         Standing balance support: Bilateral upper extremity supported Standing balance-Leahy Scale: Poor Standing balance comment: requires UE Support                           ADL either performed or assessed with clinical judgement   ADL Overall ADL's : Needs assistance/impaired                          Toilet Transfer: Min guard;RW;Minimal assistance   Toileting- Clothing Manipulation and Hygiene: Min guard;Minimal assistance       Functional mobility during ADLs: Min guard;Minimal assistance;Rolling walker(mostly min guard, min A as she fatigues; cued forest breaks ) General ADL Comments: pt completed household functional mobility at min guard to min A. Pt verbalizing that she likes to do things as independently as possible, min A level to steady at times. Extra time and effort.      Vision       Perception     Praxis      Cognition Arousal/Alertness: Awake/alert Behavior During Therapy: WFL for tasks assessed/performed Overall Cognitive Status: Within Functional Limits for tasks assessed                                          Exercises     Shoulder Instructions       General Comments Pt reporting BUE>BLE fatigue while walking    Pertinent Vitals/ Pain       Pain Assessment: Faces Faces Pain Scale: Hurts even more Pain Location: abdomen> BLE Pain Descriptors / Indicators: Sore;Tender Pain Intervention(s): Limited activity within patient's tolerance;Monitored during session;Repositioned  Home Living  Prior Functioning/Environment              Frequency  Min 2X/week        Progress Toward Goals  OT Goals(current goals can now be found in the care plan section)  Progress towards OT goals: Progressing toward goals  Acute Rehab OT Goals Patient Stated Goal: less pain OT Goal Formulation: With patient Time For Goal Achievement: 06/25/19 Potential to Achieve Goals: Fair ADL Goals Pt Will Transfer to Toilet: with min assist;bedside commode;stand pivot transfer Additional ADL Goal #1: pt will go from sit to stand with min guard assist or roll at same level for LB adls Additional ADL Goal #2: pt will be independent with written bil UE level 2  theraband HEP  Plan Discharge plan needs to be updated    Co-evaluation                 AM-PAC OT "6 Clicks" Daily Activity     Outcome Measure   Help from another person eating meals?: None Help from another person taking care of personal grooming?: A Little Help from another person toileting, which includes using toliet, bedpan, or urinal?: A Little Help from another person bathing (including washing, rinsing, drying)?: A Lot Help from another person to put on and taking off regular upper body clothing?: A Little Help from another person to put on and taking off regular lower body clothing?: A Lot 6 Click Score: 17    End of Session Equipment Utilized During Treatment: Rolling walker  OT Visit Diagnosis: Pain Pain - part of body: Leg   Activity Tolerance Patient tolerated treatment well;Patient limited by fatigue   Patient Left in bed;with call bell/phone within reach;with bed alarm set   Nurse Communication          Time: 9030-0923 OT Time Calculation (min): 25 min  Charges: OT General Charges $OT Visit: 1 Visit OT Treatments $Self Care/Home Management : 23-37 mins  Tyrone Schimke, OT Acute Rehabilitation Services Pager: [phone] Office: [phone]    Hortencia Pilar 06/22/2019, 2:14 PM

## 2019-06-22 NOTE — Progress Notes (Signed)
PROGRESS NOTE    Erika Fry  DXI:[id number] DOB: 1946-04-14 DOA: 06/09/2019 PCP: [name], MD  Brief Narrative:  73 y.o.femalewith medical history significant ofhypertension, hyperlipidemia, diet-controlled diabetes, GERD, hypothyroidism, gout, pacemaker placement, iron deficiency anemia, CKD-4, CHF, who presents with bilateral leg edema.  Assessment & Plan:   Principal Problem:   Bilateral leg edema Active Problems:   Chronic kidney disease (CKD), stage IV (severe) (HCC)   Diet-controlled diabetes mellitus (HCC)   GERD (gastroesophageal reflux disease)   High cholesterol   Hypothyroidism   Iron deficiency anemia   Essential hypertension   UTI (urinary tract infection)   Weakness generalized   Chronic diastolic CHF (congestive heart failure) (HCC)   Cellulitis of lower extremity   Acute on chronic diastolic CHF (congestive heart failure) ([place])   Hypomagnesemia   DNR (do not resuscitate) discussion   Cystitis   Pain in both lower extremities   Recurrent bilateral leg edema with cellulitis of the lower extremities:  Completed the course of antibiotics. Probably from acute diastolic heart failure.  She was diuresed appropriately with IV lasix, but since her creatinine worsened with IV diuresis, initially lasix was held but restarted her on torsemide 100 mg daily. Her creatinine continues to rise and hence re consulted nephrology.  She does not appear to be uremic at this time.     Type 2 dm: CBG (last 3)  No results for input(s): GLUCAP in the last 72 hours.  ? Diet controlled.    Acute on STAGE 4 CKD Creatinine worsened with IV diuresis.  Lasix stopped and she was started on torsemide. But in view of the worsening renal parameters, holding torsemide and re consulted nephrology, it appears she is heading towards HD.   Hypertension: Well controlled.    Hypothyroidism: Resume synthroid.    UTI:  Completed the course of antibiotics.     Constipation:  Resumed miralax and colace.   GERD; Stable.    Hyponatremia:  Stable around 131.  Monitor.  Asymptomatic.    Low grade temp, pyuria, : Urine cultures show multiple bacterial morphotypes. Unable to obtain a clear catch urine sample for cultures.  CXR and blood cultures ordered. CXR is negative for acute pneumonia. No sob or chest pain or cough.  Blood cultures are negative. Short course of keflex ordered.     DVT prophylaxis: heparin.  Code Status: full code.  Family Communication: discussed with daughter in law Anderson Malta on 06/22/2019/   Disposition Plan: pending CIR.   Consultants:   Palliative care.   Nephrology   Procedures: none.   Antimicrobials:  None.   Subjective: Patient wants to go to inpatient rehab, and does not want to go home.  CIR consulted and awaiting recommendations.   Objective: Vitals:   06/21/19 1355 06/21/19 2018 06/22/19 0428 06/22/19 1255  BP: 121/69 115/62 112/65 111/61  Pulse: 86 88 87 73  Resp: 16 18 18 18   Temp: 98.3 F (36.8 C) 100.2 F (37.9 C) 99.1 F (37.3 C) (!) 97.5 F (36.4 C)  TempSrc: Oral Oral Oral Oral  SpO2: (!) 72% 96% 97% 99%  Weight:      Height:        Intake/Output Summary (Last 24 hours) at 06/22/2019 1421 Last data filed at 06/22/2019 1101 Gross per 24 hour  Intake 1722 ml  Output 750 ml  Net 972 ml   Filed Weights   06/18/19 0615 06/20/19 0614 06/21/19 0700  Weight: 66.4 kg 67 kg 69.9 kg  Examination:  General exam: in good spirits, calm and comfortable.  Respiratory system: air entry fair , no wheezing or rhonchi.  Cardiovascular system: S1 & S2 heard, RRR. No JVD,  Gastrointestinal system: Abdomen is soft non tender non distended bowel sounds good.  Central nervous system: Alert and oriented. Non focal.  Extremities: tender lower extremities, legs bandaged.  Skin: No rashes, lesions or ulcers Psychiatry:Mood & affect appropriate.     Data Reviewed: I have personally  reviewed following labs and imaging studies  CBC: Recent Labs  Lab 06/20/19 1032 06/21/19 1515  WBC 6.9 5.9  NEUTROABS  --  3.9  HGB 8.2* 8.8*  HCT 25.8* 28.9*  MCV 93.8 94.8  PLT 262 765   Basic Metabolic Panel: Recent Labs  Lab 06/18/19 1231 06/18/19 1232 06/19/19 0605 06/20/19 0535 06/21/19 1515  NA 131* 131* 132* 131* 133*  K 4.6 4.6 4.7 4.8 5.1  CL 97* 97* 98 97* 98  CO2 19* 18* 21* 18* 15*  GLUCOSE 154* 153* 111* 96 135*  BUN 88* 88* 95* 99* 111*  CREATININE 5.21* 5.42* 5.42* 5.75* 5.93*  CALCIUM 9.5 9.5 9.8 9.8 9.7  PHOS  --  5.8* 6.3* 6.0*  --    GFR: Estimated Creatinine Clearance: 8 mL/min (A) (by C-G formula based on SCr of 5.93 mg/dL (H)). Liver Function Tests: Recent Labs  Lab 06/18/19 1232 06/19/19 0605 06/20/19 0535  ALBUMIN 2.6* 2.7* 2.6*   No results for input(s): LIPASE, AMYLASE in the last 168 hours. No results for input(s): AMMONIA in the last 168 hours. Coagulation Profile: No results for input(s): INR, PROTIME in the last 168 hours. Cardiac Enzymes: No results for input(s): CKTOTAL, CKMB, CKMBINDEX, TROPONINI in the last 168 hours. BNP (last 3 results) No results for input(s): PROBNP in the last 8760 hours. HbA1C: No results for input(s): HGBA1C in the last 72 hours. CBG: No results for input(s): GLUCAP in the last 168 hours. Lipid Profile: No results for input(s): CHOL, HDL, LDLCALC, TRIG, CHOLHDL, LDLDIRECT in the last 72 hours. Thyroid Function Tests: No results for input(s): TSH, T4TOTAL, FREET4, T3FREE, THYROIDAB in the last 72 hours. Anemia Panel: No results for input(s): VITAMINB12, FOLATE, FERRITIN, TIBC, IRON, RETICCTPCT in the last 72 hours. Sepsis Labs: No results for input(s): PROCALCITON, LATICACIDVEN in the last 168 hours.  Recent Results (from the past 240 hour(s))  Culture, Urine     Status: Abnormal   Collection Time: 06/20/19  9:14 AM   Specimen: Urine, Random  Result Value Ref Range Status   Specimen  Description   Final    URINE, RANDOM Performed at [facility] [address], [place], [place] [address]    Special Requests   Final    NONE Performed at [facility], [place] [address], [place], [place] [address]    Culture MULTIPLE SPECIES PRESENT, SUGGEST RECOLLECTION (A)  Final   Report Status 06/21/2019 FINAL  Final  Culture, blood (Routine X 2) w Reflex to ID Panel     Status: None (Preliminary result)   Collection Time: 06/20/19 10:30 AM   Specimen: Right Antecubital; Blood  Result Value Ref Range Status   Specimen Description   Final    RIGHT ANTECUBITAL Performed at [facility] [address] [place], [place] [address]    Special Requests   Final    BOTTLES DRAWN AEROBIC ONLY Blood Culture adequate volume Performed at [facility] [address] [place], [place] [address]  Culture   Final    NO GROWTH 2 DAYS Performed at [facility], [place] [address] [place], [place] [address]    Report Status PENDING  Incomplete  Culture, blood (Routine X 2) w Reflex to ID Panel     Status: None (Preliminary result)   Collection Time: 06/20/19 10:30 AM   Specimen: Left Antecubital; Blood  Result Value Ref Range Status   Specimen Description   Final    LEFT ANTECUBITAL Performed at [facility] [address] [place], [place] [address]    Special Requests   Final    BOTTLES DRAWN AEROBIC ONLY Blood Culture adequate volume Performed at [facility] [address], [place], [place] [address]    Culture   Final    NO GROWTH 2 DAYS Performed at [facility] [address], [place], [place] [address]    Report Status PENDING  Incomplete         Radiology Studies: No results found.      Scheduled Meds: . docusate sodium  100 mg Oral BID  . feeding supplement (ENSURE ENLIVE)  237 mL Oral Q24H  . feeding supplement (PRO-STAT SUGAR FREE 64)   30 mL Oral Daily  . ferrous sulfate  325 mg Oral Q breakfast  . gabapentin  100 mg Oral TID  . Gerhardt's butt cream   Topical Daily  . heparin  5,000 Units Subcutaneous Q8H  . levothyroxine  75 mcg Oral QAC breakfast  . loratadine  10 mg Oral Daily  . multivitamin with minerals  1 tablet Oral Daily  . pantoprazole  40 mg Oral Daily  . polyethylene glycol  17 g Oral BID  . senna  1 tablet Oral QHS  . simvastatin  40 mg Oral QHS   Continuous Infusions: . sodium chloride    . cefTRIAXone (ROCEPHIN)  IV 1 g (06/21/19 1036)     LOS: 12 days    Time spent: 30 minutes.     Hosie Poisson, MD [facility] Pager [phone]  If 7PM-7AM, please contact night-coverage [email or web address] Password [email or web address] 06/22/2019, 2:21 PM

## 2019-06-22 NOTE — Progress Notes (Signed)
Patient ID: Erika Fry, female   DOB: [birth year], 73 y.o.   MRN: [id number]  This NP visited patient at the bedside as a follow up to for palliative medicine needs and emotional support.  Continued conversation with patient and her daughter-in-law Erika Fry by phone regarding overall medical situation.  Patient has multiple comorbidities which makes her high risk for decompensation.    I shared my greatest concern regarding her worsening renal function and the likely eminent decision regarding initiation of dialysis.  Patient verbalizes her openness to initiation of dialysis if recommended  Patient remains weak and deconditioned, currently lying in bed.  Discussed with patient the importance of self motivated exercise and mobility and sitting up in the chair during the day.  Day discussion is around the hope and possibility that patient would be eligible for inpatient CIR.  Family is very hopeful for this opportunity   Plan of care: -Patient remains full code--encourage patient and family to consider DNR/DNI status knowing poor outcomes in similar patients -Patient and family are open to all offered and available medical interventions to prolong life, all remain hopeful for improvement -Transition to CIR if eligible   Again discussed with patient the importance of continued conversation with her family and their  medical providers regarding overall plan of care and treatment options,  ensuring decisions are within the context of the patients values and GOCs.  Questions and concerns addressed   Discussed with Dr Karleen Hampshire  Total time spent on the unit was 35 minutes  Greater than 50% of the time was spent in counseling and coordination of care  Wadie Lessen NP  Palliative Medicine Team Team Phone # [phone] Pager [phone]

## 2019-06-22 NOTE — Progress Notes (Signed)
Inpatient Rehab Admissions:  Inpatient Rehab Consult received.  I spoke with pt at the bedside for rehabilitation assessment and to discuss goals and expectations of an inpatient rehab admission. Pt very familiar with CIR program as she has been with [place] twice before. With permission, [facility] has left voicemail with family for call back to confirm caregiver support. Noted pt with worsening renal function with plans for re-consult today. Will follow for medical readiness and follow for continued therapy needs to help with DC planning.   Please call if questions.   [name], OTR/L  Rehab Admissions Coordinator  [phone] 06/22/2019 10:54 AM

## 2019-06-23 ENCOUNTER — Encounter: Payer: Self-pay | Admitting: Gastroenterology

## 2019-06-23 DIAGNOSIS — R531 Weakness: Secondary | ICD-10-CM

## 2019-06-23 DIAGNOSIS — Z515 Encounter for palliative care: Secondary | ICD-10-CM

## 2019-06-23 LAB — RENAL FUNCTION PANEL
Albumin: 2.8 g/dL — ABNORMAL LOW (ref 3.5–5.0)
Anion gap: 12 (ref 5–15)
BUN: 111 mg/dL — ABNORMAL HIGH (ref 8–23)
CO2: 22 mmol/L (ref 22–32)
Calcium: 10 mg/dL (ref 8.9–10.3)
Chloride: 102 mmol/L (ref 98–111)
Creatinine, Ser: 5.56 mg/dL — ABNORMAL HIGH (ref 0.44–1.00)
GFR calc Af Amer: 8 mL/min — ABNORMAL LOW (ref >60)
GFR calc non Af Amer: 7 mL/min — ABNORMAL LOW (ref >60)
Glucose, Bld: 115 mg/dL — ABNORMAL HIGH (ref 70–99)
Phosphorus: 5.1 mg/dL — ABNORMAL HIGH (ref 2.5–4.6)
Potassium: 5.2 mmol/L — ABNORMAL HIGH (ref 3.5–5.1)
Sodium: 136 mmol/L (ref 135–145)

## 2019-06-23 NOTE — Progress Notes (Signed)
[name]  PROGRESS NOTE    Erika Fry  TKW:[id number] DOB: 30-May-1946 DOA: 06/09/2019 PCP: [name], MD   Brief Narrative:   73 y.o.femalewith medical history significant ofhypertension, hyperlipidemia, diet-controlled diabetes, GERD, hypothyroidism, gout, pacemaker placement, iron deficiency anemia, CKD-4, CHF, who presents with bilateral leg edema.   Assessment & Plan:   Principal Problem:   Bilateral leg edema Active Problems:   Chronic kidney disease (CKD), stage IV (severe) (HCC)   Diet-controlled diabetes mellitus (HCC)   GERD (gastroesophageal reflux disease)   High cholesterol   Hypothyroidism   Iron deficiency anemia   Essential hypertension   UTI (urinary tract infection)   Weakness generalized   Chronic diastolic CHF (congestive heart failure) (HCC)   Cellulitis of lower extremity   Acute on chronic diastolic CHF (congestive heart failure) ([place])   Hypomagnesemia   DNR (do not resuscitate) discussion   Cystitis   Pain in both lower extremities   Recurrent bilateral leg edema with cellulitis of the lower extremities:      - Completed the course of antibiotics.      - Probably from acute diastolic heart failure.      - She was diuresed appropriately with IV lasix, but since her creatinine worsened with IV diuresis, initially lasix was held but restarted her on torsemide 100 mg daily.            - Nephro consulted, toresemide held, looks like pt is now ESRD, nephro requesting transfer to [facility] for line placement/initiation of dialysis   DM2     - glucose ok     - diet control, monitor  AKI on STAGE 4 CKD; now ESRD     - Creatinine worsened with IV diuresis.      - Lasix stopped and she was started on torsemide. But in view of the worsening renal parameters, holding torsemide and consulted nephrology, it appears she is heading towards HD.     - Nephro consulted, toresemide held, looks like pt is now ESRD, nephro requesting transfer to [facility] for line  placement/initiation of dialysis  Hypertension     - BP ok off meds, monitor   Hypothyroidism     - synthroid.   UTI:      - to complete keflex today  Constipation:      - Resumed miralax and colace.   GERD:      protonix   Hyponatremia:      - resolved, monitor  Low grade temp, pyuria, :     - Urine cultures show multiple bacterial morphotypes. Unable to obtain a clear catch urine sample for cultures.      - CXR is negative for acute pneumonia; No sob or chest pain or cough.      - Blood cultures are negative.      - Short course of keflex was ordered; to be completed today    DVT prophylaxis: heparin Code Status: FULL   Disposition Plan: TBD   Consultants:   Nephrology  Palliative care  Antimicrobials:  . Keflex    Subjective: "When will this fluid come off?"  Objective: Vitals:   06/22/19 2051 06/23/19 0324 06/23/19 0715 06/23/19 1357  BP: 139/74  113/81 (!) 120/59  Pulse: 81  85 73  Resp: 16  20 19   Temp: 99 F (37.2 C)  98.8 F (37.1 C) 98.4 F (36.9 C)  TempSrc: Oral  Oral Oral  SpO2: 97%  98% 100%  Weight:  68 kg  Height:        Intake/Output Summary (Last 24 hours) at 06/23/2019 1615 Last data filed at 06/23/2019 1500 Gross per 24 hour  Intake 1031.04 ml  Output 2034 ml  Net -1002.96 ml   Filed Weights   06/20/19 0614 06/21/19 0700 06/23/19 0324  Weight: 67 kg 69.9 kg 68 kg    Examination:  General: 73 y.o. female resting in bed in NAD Cardiovascular: RRR, +S1, S2, no m/g/r, equal pulses throughout Respiratory: CTABL, no w/r/r, normal WOB GI: BS+, NDNT, no masses noted, no organomegaly noted MSK: BLE edema to shins; TTP b/l Skin: No rashes, bruises, ulcerations noted Neuro: Alert to name, follows commands  Data Reviewed: I have personally reviewed following labs and imaging studies.  CBC: Recent Labs  Lab 06/20/19 1032 06/21/19 1515 06/22/19 1523  WBC 6.9 5.9 6.3  NEUTROABS  --  3.9 4.1  HGB 8.2* 8.8* 8.6*   HCT 25.8* 28.9* 27.8*  MCV 93.8 94.8 93.9  PLT 262 335 431   Basic Metabolic Panel: Recent Labs  Lab 06/18/19 1232 06/19/19 0605 06/20/19 0535 06/21/19 1515 06/23/19 0455  NA 131* 132* 131* 133* 136  K 4.6 4.7 4.8 5.1 5.2*  CL 97* 98 97* 98 102  CO2 18* 21* 18* 15* 22  GLUCOSE 153* 111* 96 135* 115*  BUN 88* 95* 99* 111* 111*  CREATININE 5.42* 5.42* 5.75* 5.93* 5.56*  CALCIUM 9.5 9.8 9.8 9.7 10.0  PHOS 5.8* 6.3* 6.0*  --  5.1*   GFR: Estimated Creatinine Clearance: 7.9 mL/min (A) (by C-G formula based on SCr of 5.56 mg/dL (H)). Liver Function Tests: Recent Labs  Lab 06/18/19 1232 06/19/19 0605 06/20/19 0535 06/23/19 0455  ALBUMIN 2.6* 2.7* 2.6* 2.8*   No results for input(s): LIPASE, AMYLASE in the last 168 hours. No results for input(s): AMMONIA in the last 168 hours. Coagulation Profile: No results for input(s): INR, PROTIME in the last 168 hours. Cardiac Enzymes: No results for input(s): CKTOTAL, CKMB, CKMBINDEX, TROPONINI in the last 168 hours. BNP (last 3 results) No results for input(s): PROBNP in the last 8760 hours. HbA1C: No results for input(s): HGBA1C in the last 72 hours. CBG: No results for input(s): GLUCAP in the last 168 hours. Lipid Profile: No results for input(s): CHOL, HDL, LDLCALC, TRIG, CHOLHDL, LDLDIRECT in the last 72 hours. Thyroid Function Tests: No results for input(s): TSH, T4TOTAL, FREET4, T3FREE, THYROIDAB in the last 72 hours. Anemia Panel: No results for input(s): VITAMINB12, FOLATE, FERRITIN, TIBC, IRON, RETICCTPCT in the last 72 hours. Sepsis Labs: No results for input(s): PROCALCITON, LATICACIDVEN in the last 168 hours.  Recent Results (from the past 240 hour(s))  Culture, Urine     Status: Abnormal   Collection Time: 06/20/19  9:14 AM   Specimen: Urine, Random  Result Value Ref Range Status   Specimen Description   Final    URINE, RANDOM Performed at [facility] [address] [place], [place]  [address]    Special Requests   Final    NONE Performed at [facility], [place] [address] [place], [place] [address]    Culture MULTIPLE SPECIES PRESENT, SUGGEST RECOLLECTION (A)  Final   Report Status 06/21/2019 FINAL  Final  Culture, blood (Routine X 2) w Reflex to ID Panel     Status: None (Preliminary result)   Collection Time: 06/20/19 10:30 AM   Specimen: Right Antecubital; Blood  Result Value Ref Range Status   Specimen Description   Final  RIGHT ANTECUBITAL Performed at [facility] [address] [place], [place] [address]    Special Requests   Final    BOTTLES DRAWN AEROBIC ONLY Blood Culture adequate volume Performed at [facility] [address], [place], [place] [address]    Culture   Final    NO GROWTH 3 DAYS Performed at [facility], [place] [address], [place], [place] [address]    Report Status PENDING  Incomplete  Culture, blood (Routine X 2) w Reflex to ID Panel     Status: None (Preliminary result)   Collection Time: 06/20/19 10:30 AM   Specimen: Left Antecubital; Blood  Result Value Ref Range Status   Specimen Description   Final    LEFT ANTECUBITAL Performed at [facility] [address], [place], [place] [address]    Special Requests   Final    BOTTLES DRAWN AEROBIC ONLY Blood Culture adequate volume Performed at [facility] [address], [place], [place] [address]    Culture   Final    NO GROWTH 3 DAYS Performed at [facility], [place] [address], [place], [place] [address]    Report Status PENDING  Incomplete         Radiology Studies: No results found.      Scheduled Meds: . cephALEXin  250 mg Oral Daily  . docusate sodium  100 mg Oral BID  . feeding supplement (ENSURE ENLIVE)  237 mL Oral Q24H  . feeding supplement (PRO-STAT SUGAR FREE 64)  30 mL Oral Daily  . ferrous sulfate  325 mg Oral Q breakfast  . gabapentin  100 mg  Oral TID  . Gerhardt's butt cream   Topical Daily  . heparin  5,000 Units Subcutaneous Q8H  . levothyroxine  75 mcg Oral QAC breakfast  . loratadine  10 mg Oral Daily  . multivitamin with minerals  1 tablet Oral Daily  . pantoprazole  40 mg Oral Daily  . polyethylene glycol  17 g Oral BID  . senna  1 tablet Oral QHS  . simvastatin  40 mg Oral QHS   Continuous Infusions: . sodium chloride    . sodium chloride 50 mL/hr at 06/23/19 1120     LOS: 13 days    Time spent: 25 minutes spent in the coordination of care today.    Jonnie Finner, DO [facility] Pager [phone]  If 7PM-7AM, please contact night-coverage [email or web address] Password [facility] 06/23/2019, 4:15 PM

## 2019-06-23 NOTE — Plan of Care (Signed)
  Problem: Activity: Goal: Risk for activity intolerance will decrease Outcome: Progressing   Problem: Nutrition: Goal: Adequate nutrition will be maintained Outcome: Progressing   

## 2019-06-23 NOTE — Progress Notes (Signed)
Physical Therapy Treatment Patient Details Name: Erika Fry MRN: [id number] DOB: 05/04/1946 Today's Date: 06/23/2019    History of Present Illness 73 y.o. female with medical history significant of hypertension, hyperlipidemia, diet-controlled diabetes, GERD, hypothyroidism, gout, pacemaker placement, iron deficiency anemia, CKD-4, CHF, peripheral neuropathy, and recent R nephrostomy drain placed 04/2019 who presents with bilateral leg edema.    PT Comments    Patient progressing with ambulation this session, though still needing assistance for all aspects of mobility, toileting, etc.  Continues to be a good candidate for short CIR stay.  PT to follow.    Follow Up Recommendations  CIR     Equipment Recommendations  None recommended by PT    Recommendations for Other Services       Precautions / Restrictions Precautions Precautions: Fall    Mobility  Bed Mobility Overal bed mobility: Needs Assistance Bed Mobility: Supine to Sit;Sit to Supine     Supine to sit: Min guard Sit to supine: Min guard   General bed mobility comments: increased time and effort with LEs, pt self assisted  Transfers Overall transfer level: Needs assistance Equipment used: Rolling walker (2 wheeled) Transfers: Sit to/from [facility] Sit to Stand: Min assist Stand pivot transfers: Min assist;Min guard       General transfer comment: assist to [facility] for balance  Ambulation/Gait Ambulation/Gait assistance: Min assist Gait Distance (Feet): 90 Feet Assistive device: Rolling walker (2 wheeled) Gait Pattern/deviations: Step-through pattern;Decreased stride length;Trunk flexed     General Gait Details: assist for balance   Stairs             Wheelchair Mobility    Modified Rankin (Stroke Patients Only)       Balance Overall balance assessment: Needs assistance Sitting-balance support: Feet supported Sitting balance-Leahy Scale: Good     Standing  balance support: Single extremity supported Standing balance-Leahy Scale: Poor Standing balance comment: at least one UE support while assisted to don/doff pants for toileting                            Cognition Arousal/Alertness: Awake/alert Behavior During Therapy: WFL for tasks assessed/performed Overall Cognitive Status: Within Functional Limits for tasks assessed                                        Exercises General Exercises - Lower Extremity Ankle Circles/Pumps: 10 reps;Both;Supine Quad Sets: 10 reps;Both;Supine Hip ABduction/ADduction: Both;AROM;10 reps;Supine(hooklying) Other Exercises Other Exercises: bridging x 10    General Comments        Pertinent Vitals/Pain Pain Assessment: Faces Faces Pain Scale: Hurts little more Pain Location: LE's Pain Descriptors / Indicators: Tender    Home Living                      Prior Function            PT Goals (current goals can now be found in the care plan section) Progress towards PT goals: Progressing toward goals    Frequency    Min 3X/week      PT Plan Current plan remains appropriate    Co-evaluation              AM-PAC PT "6 Clicks" Mobility   Outcome Measure  Help needed turning from your back to your side while in a flat bed  without using bedrails?: A Little Help needed moving from lying on your back to sitting on the side of a flat bed without using bedrails?: A Little Help needed moving to and from a bed to a chair (including a wheelchair)?: A Little Help needed standing up from a chair using your arms (e.g., wheelchair or bedside chair)?: A Little Help needed to walk in hospital room?: A Little Help needed climbing 3-5 steps with a railing? : A Lot 6 Click Score: 17    End of Session Equipment Utilized During Treatment: Gait belt Activity Tolerance: Patient tolerated treatment well Patient left: in bed;with call bell/phone within reach;with bed  alarm set   PT Visit Diagnosis: Muscle weakness (generalized) (M62.81);Difficulty in walking, not elsewhere classified (R26.2)     Time: 1528-1600 PT Time Calculation (min) (ACUTE ONLY): 32 min  Charges:  $Gait Training: 8-22 mins $Therapeutic Exercise: 8-22 mins                     Magda Kiel, [place] [facility] [phone] 06/23/2019    Reginia Naas 06/23/2019, 5:38 PM

## 2019-06-23 NOTE — Progress Notes (Signed)
Patient ID: Erika Fry, female   DOB: [birth year], 73 y.o.   MRN: [id number]  This NP visited patient at the bedside as a follow up to for palliative medicine needs and emotional support.  Per family request I again initiated a conversation regarding her decision regarding initiation  of dialysis.  Continued conversation with patient's overall medical situation.  She verbalizes a clear understanding of treatment decisions facing her and the impact it will have on her life ( independence, transportation and overall wellness)  We discussed human mortality and limitations of medical interventions when a body fails to thrive.  At this time she sees no other option. She understands that without dialysis her prognosis is limited. "I have to give it a try" Patient is open to all offered and available medical interventions to prolong life.   Patient has multiple comorbidities which makes her high risk for decompensation.    Patient remains weak and deconditioned,  Is hopeful for CIR for rehab.  Discussed with patient the importance of self motivated exercise and mobility and sitting up in the chair during the day.   Plan of care: -Patient remains full code--encourage patient and family to consider DNR/DNI status knowing poor outcomes in similar patients -Patient and family are open to all offered and available medical interventions to prolong life, all remain hopeful for improvement -open to dialysis if recommended -Transition to CIR if eligible   Again discussed with patient the importance of continued conversation with her family and their  medical providers regarding overall plan of care and treatment options,  ensuring decisions are within the context of the patients values and GOCs.  Questions and concerns addressed   Emotional support offered  Total time spent on the unit was 25 minutes  Greater than 50% of the time was spent in counseling and coordination of care  Wadie Lessen NP   Palliative Medicine Team Team Phone # [phone] Pager [phone]

## 2019-06-23 NOTE — Progress Notes (Signed)
Inpatient Rehabilitation-Admissions Coordinator   Noted need for continued monitoring of renal function and discussions about possible HD. AC will continue to follow for finite plans regarding HD.  If pt were to proceed with HD, she would need to be clipped to an OP HD facility prior to admission to CIR.   [name], OTR/L  Rehab Admissions Coordinator  [phone] 06/23/2019 12:39 PM

## 2019-06-23 NOTE — Progress Notes (Signed)
Subjective:  BUN/Creat coming down w/ IVF"s started last night.   Objective Vital signs in last 24 hours: Vitals:   06/22/19 1255 06/22/19 2051 06/23/19 0324 06/23/19 0715  BP: 111/61 139/74  113/81  Pulse: 73 81  85  Resp: 18 16  20   Temp: (!) 97.5 F (36.4 C) 99 F (37.2 C)  98.8 F (37.1 C)  TempSrc: Oral Oral  Oral  SpO2: 99% 97%  98%  Weight:   68 kg   Height:       Weight change:   Intake/Output Summary (Last 24 hours) at 06/23/2019 1037 Last data filed at 06/23/2019 6712 Gross per 24 hour  Intake 1271.04 ml  Output 2058 ml  Net -786.96 ml    Assessment/ Plan: Pt is a 73 y.o. yo female with late stage CKD and quite a bit of lability with persistent problems of volume status and diuretic management who presented with hypervolemia and lower extremity edema 7 days after being in our office with overdiuresis.  It appears that she has clinically improved but over the past few days creatinine has worsened  Assessment/Plan:  Advanced CKD: but labile and dependent on volume status.  Pt does not seem uremic but we may have to consider starting dialysis given difficulties we are having w/ controlling volume status.  B/Cr down today, will lower IVF's to 50 cc/hr , repeat labs in am. Will d/w neph MD, pt and family about possibility of proceeding w/ HD.   HTN/vol- doubt wt's are accurate, looks euvolemic, possibly dry on exam  Hyperkalemia: mild, changed to renal diet  Interstitial cystitis: reportedly a serious issue, frequent urination may be a QOL issue when on the HD machine  Anemia- stable in the 9's   Deconditioning- chronic issue- PT/OT   Kelly Splinter, MD 06/23/2019, 10:37 AM   Labs: Basic Metabolic Panel: Recent Labs  Lab 06/19/19 0605 06/20/19 0535 06/21/19 1515 06/23/19 0455  NA 132* 131* 133* 136  K 4.7 4.8 5.1 5.2*  CL 98 97* 98 102  CO2 21* 18* 15* 22  GLUCOSE 111* 96 135* 115*  BUN 95* 99* 111* 111*  CREATININE 5.42* 5.75* 5.93* 5.56*  CALCIUM 9.8  9.8 9.7 10.0  PHOS 6.3* 6.0*  --  5.1*   Liver Function Tests: Recent Labs  Lab 06/19/19 0605 06/20/19 0535 06/23/19 0455  ALBUMIN 2.7* 2.6* 2.8*   No results for input(s): LIPASE, AMYLASE in the last 168 hours. No results for input(s): AMMONIA in the last 168 hours. CBC: Recent Labs  Lab 06/20/19 1032 06/21/19 1515 06/22/19 1523  WBC 6.9 5.9 6.3  NEUTROABS  --  3.9 4.1  HGB 8.2* 8.8* 8.6*  HCT 25.8* 28.9* 27.8*  MCV 93.8 94.8 93.9  PLT 262 335 348   Cardiac Enzymes: No results for input(s): CKTOTAL, CKMB, CKMBINDEX, TROPONINI in the last 168 hours. CBG: No results for input(s): GLUCAP in the last 168 hours.  Iron Studies: No results for input(s): IRON, TIBC, TRANSFERRIN, FERRITIN in the last 72 hours. Studies/Results: No results found. Medications: Infusions: . sodium chloride    . sodium chloride 75 mL/hr at 06/23/19 0557    Scheduled Medications: . cephALEXin  250 mg Oral Daily  . docusate sodium  100 mg Oral BID  . feeding supplement (ENSURE ENLIVE)  237 mL Oral Q24H  . feeding supplement (PRO-STAT SUGAR FREE 64)  30 mL Oral Daily  . ferrous sulfate  325 mg Oral Q breakfast  . gabapentin  100 mg Oral TID  .  Gerhardt's butt cream   Topical Daily  . heparin  5,000 Units Subcutaneous Q8H  . levothyroxine  75 mcg Oral QAC breakfast  . loratadine  10 mg Oral Daily  . multivitamin with minerals  1 tablet Oral Daily  . pantoprazole  40 mg Oral Daily  . polyethylene glycol  17 g Oral BID  . senna  1 tablet Oral QHS  . simvastatin  40 mg Oral QHS    have reviewed scheduled and prn medications.  Physical Exam: General: deconditioned but NAD- pleasant and Ox 3 Heart: RRR Lungs: are clear  Abdomen: soft, non tender Extremities: wrinkling skin bilat feet w/o edema , pretib min edema, no thigh edema    06/23/2019,10:37 AM  LOS: 13 days

## 2019-06-23 NOTE — Progress Notes (Signed)
Spoke w/ pt's family, pt and Dr Johnney Ou, plan is to proceed with starting dialysis. Will consult IR for [facility] and have pt transferred to [facility].    Kelly Splinter, MD 06/23/2019, 3:57 PM

## 2019-06-24 ENCOUNTER — Inpatient Hospital Stay (HOSPITAL_COMMUNITY): Payer: Medicare Other

## 2019-06-24 ENCOUNTER — Encounter (HOSPITAL_COMMUNITY): Payer: Self-pay | Admitting: Interventional Radiology

## 2019-06-24 DIAGNOSIS — Z992 Dependence on renal dialysis: Secondary | ICD-10-CM

## 2019-06-24 DIAGNOSIS — N184 Chronic kidney disease, stage 4 (severe): Secondary | ICD-10-CM

## 2019-06-24 DIAGNOSIS — R601 Generalized edema: Secondary | ICD-10-CM

## 2019-06-24 DIAGNOSIS — N3090 Cystitis, unspecified without hematuria: Secondary | ICD-10-CM

## 2019-06-24 DIAGNOSIS — N186 End stage renal disease: Secondary | ICD-10-CM

## 2019-06-24 HISTORY — PX: IR FLUORO GUIDE CV LINE RIGHT: IMG2283

## 2019-06-24 HISTORY — PX: IR US GUIDE VASC ACCESS RIGHT: IMG2390

## 2019-06-24 LAB — CBC WITH DIFFERENTIAL/PLATELET
Abs Immature Granulocytes: 0.1 10*3/uL — ABNORMAL HIGH (ref 0.00–0.07)
Basophils Absolute: 0.1 10*3/uL (ref 0.0–0.1)
Basophils Relative: 1 %
Eosinophils Absolute: 0.2 10*3/uL (ref 0.0–0.5)
Eosinophils Relative: 4 %
HCT: 27.7 % — ABNORMAL LOW (ref 36.0–46.0)
Hemoglobin: 8.6 g/dL — ABNORMAL LOW (ref 12.0–15.0)
Immature Granulocytes: 2 %
Lymphocytes Relative: 20 %
Lymphs Abs: 1.2 10*3/uL (ref 0.7–4.0)
MCH: 29.3 pg (ref 26.0–34.0)
MCHC: 31 g/dL (ref 30.0–36.0)
MCV: 94.2 fL (ref 80.0–100.0)
Monocytes Absolute: 0.6 10*3/uL (ref 0.1–1.0)
Monocytes Relative: 10 %
Neutro Abs: 3.9 10*3/uL (ref 1.7–7.7)
Neutrophils Relative %: 63 %
Platelets: 408 10*3/uL — ABNORMAL HIGH (ref 150–400)
RBC: 2.94 MIL/uL — ABNORMAL LOW (ref 3.87–5.11)
RDW: 13.9 % (ref 11.5–15.5)
WBC: 6.1 10*3/uL (ref 4.0–10.5)
nRBC: 0 % (ref 0.0–0.2)

## 2019-06-24 LAB — PROTIME-INR
INR: 1 (ref 0.8–1.2)
Prothrombin Time: 13.1 seconds (ref 11.4–15.2)

## 2019-06-24 LAB — GLUCOSE, CAPILLARY
Glucose-Capillary: 83 mg/dL (ref 70–99)
Glucose-Capillary: 105 mg/dL — ABNORMAL HIGH (ref 70–99)
Glucose-Capillary: 89 mg/dL (ref 70–99)
Glucose-Capillary: 95 mg/dL (ref 70–99)
Glucose-Capillary: 127 mg/dL — ABNORMAL HIGH (ref 70–99)

## 2019-06-24 MED ORDER — HEPARIN SODIUM (PORCINE) 1000 UNIT/ML IJ SOLN
INTRAMUSCULAR | Status: AC
  Administered 2019-06-24: 3.2 [IU]
  Filled 2019-06-24: qty 1

## 2019-06-24 MED ORDER — HEPARIN SODIUM (PORCINE) 5000 UNIT/ML IJ SOLN
5000.0000 [IU] | Freq: Three times a day (TID) | INTRAMUSCULAR | Status: DC
  Administered 2019-06-25 – 2019-06-30 (×14): 5000 [IU] via SUBCUTANEOUS
  Filled 2019-06-24 (×13): qty 1

## 2019-06-24 MED ORDER — CEFAZOLIN SODIUM-DEXTROSE 2-4 GM/100ML-% IV SOLN
INTRAVENOUS | Status: AC
  Administered 2019-06-24: 2 g via INTRAVENOUS
  Filled 2019-06-24: qty 100

## 2019-06-24 MED ORDER — LIDOCAINE HCL 1 % IJ SOLN
INTRAMUSCULAR | Status: AC
  Filled 2019-06-24: qty 20

## 2019-06-24 MED ORDER — LIDOCAINE-EPINEPHRINE (PF) 1 %-1:200000 IJ SOLN
INTRAMUSCULAR | Status: AC | PRN
  Administered 2019-06-24: 10 mL

## 2019-06-24 MED ORDER — WHITE PETROLATUM EX OINT
TOPICAL_OINTMENT | CUTANEOUS | Status: AC
  Administered 2019-06-24: 0.2
  Filled 2019-06-24: qty 28.35

## 2019-06-24 MED ORDER — MIDAZOLAM HCL 2 MG/2ML IJ SOLN
INTRAMUSCULAR | Status: AC | PRN
  Administered 2019-06-24: 1 mg via INTRAVENOUS

## 2019-06-24 MED ORDER — CEFAZOLIN SODIUM-DEXTROSE 2-4 GM/100ML-% IV SOLN
2.0000 g | INTRAVENOUS | Status: AC
  Administered 2019-06-24: 2 g via INTRAVENOUS
  Filled 2019-06-24: qty 100

## 2019-06-24 MED ORDER — LIDOCAINE-EPINEPHRINE (PF) 1 %-1:200000 IJ SOLN
INTRAMUSCULAR | Status: AC
  Filled 2019-06-24: qty 30

## 2019-06-24 MED ORDER — MIDAZOLAM HCL 2 MG/2ML IJ SOLN
INTRAMUSCULAR | Status: AC
  Filled 2019-06-24: qty 2

## 2019-06-24 MED ORDER — CHLORHEXIDINE GLUCONATE CLOTH 2 % EX PADS
6.0000 | MEDICATED_PAD | Freq: Every day | CUTANEOUS | Status: DC
  Administered 2019-06-25: 6 via TOPICAL

## 2019-06-24 MED ORDER — CHLORHEXIDINE GLUCONATE CLOTH 2 % EX PADS: 6.0000 | MEDICATED_PAD | Freq: Every day | CUTANEOUS | Status: DC

## 2019-06-24 MED ORDER — FENTANYL CITRATE (PF) 100 MCG/2ML IJ SOLN
INTRAMUSCULAR | Status: AC
  Filled 2019-06-24: qty 2

## 2019-06-24 MED ORDER — FENTANYL CITRATE (PF) 100 MCG/2ML IJ SOLN
INTRAMUSCULAR | Status: AC | PRN
  Administered 2019-06-24: 50 ug via INTRAVENOUS

## 2019-06-24 NOTE — Plan of Care (Signed)
  Problem: Activity: Goal: Risk for activity intolerance will decrease Outcome: Progressing   Problem: Pain Managment: Goal: General experience of comfort will improve Outcome: Progressing   Problem: Education: Goal: Knowledge of disease and its progression will improve Outcome: Progressing

## 2019-06-24 NOTE — Sedation Documentation (Signed)
Cardiac Rhythm: V paced

## 2019-06-24 NOTE — Consult Note (Signed)
Hospital Consult    Reason for Consult: New AV fistula access Referring Physician: Nephrology MRN #:  [id number]  History of Present Illness: This is a 73 y.o. female with history of hypertension, hyperlipidemia, diabetes, CHF, and stage IV chronic kidney disease that vascular surgery has been consulted for permanent dialysis access.  Patient states she was admitted due to recurrent bilateral leg edema in the setting of acute on chronic heart failure.  She has been being seen by nephrology and has had a lot of issues with overdiuresis in the setting of her volume overload.  Ultimately the decision was made to proceed with hemodialysis.  IR was consulted today and she had a TDC placed in the right IJ.  Vascular surgery was consulted for permanent access. Patient reports she is right handed.  She does have a permanent pacemaker in her left chest wall that has been present for several years.  She states no previous access.  She does have numbness in both of her hands with chronic neuropathy.  Past Medical History:  Diagnosis Date  . Arthritis    "knees, thumbs" (03/25/2018)  . CKD (chronic kidney disease), stage IV ([place])   . Diet-controlled diabetes mellitus ([place])   . GERD (gastroesophageal reflux disease)   . Gout    "on daily RX" (03/25/2018)  . Heart murmur   . High cholesterol   . Hypertension   . Hypothyroidism   . Iron deficiency anemia    "had to get an iron infusion"  . Migraine    "used to have them growing up" (03/25/2018)  . Presence of permanent cardiac pacemaker 03/25/2018    Past Surgical History:  Procedure Laterality Date  . APPENDECTOMY    . BIOPSY  05/19/2018   Procedure: BIOPSY;  Surgeon: Ladene Artist, MD;  Location: [facility] [place];  Service: Endoscopy;;  . Consuela Mimes W/ RETROGRADES Bilateral 01/14/2019   Procedure: CYSTOSCOPY WITH RETROGRADE PYELOGRAM BILATERAL HYDRODISTENTION;  Surgeon: Ardis Hughs, MD;  Location: [facility];  Service: Urology;  Laterality:  Bilateral;  . ESOPHAGOGASTRODUODENOSCOPY N/A 05/19/2018   Procedure: ESOPHAGOGASTRODUODENOSCOPY (EGD);  Surgeon: Ladene Artist, MD;  Location: [facility] ENDOSCOPY;  Service: Endoscopy;  Laterality: N/A;  . ESOPHAGOGASTRODUODENOSCOPY (EGD) WITH PROPOFOL N/A 01/28/2019   Procedure: ESOPHAGOGASTRODUODENOSCOPY (EGD) WITH PROPOFOL;  Surgeon: Rush Landmark Telford Nab., MD;  Location: [place];  Service: Gastroenterology;  Laterality: N/A;  . INSERT / REPLACE / REMOVE PACEMAKER  03/25/2018  . IR FLUORO GUIDE CV LINE RIGHT  05/18/2018  . IR FLUORO GUIDE CV LINE RIGHT  06/24/2019  . IR LUMBAR DISC ASPIRATION W/IMG GUIDE  05/15/2018  . IR NEPHROSTOMY EXCHANGE RIGHT  05/28/2019  . IR NEPHROSTOMY PLACEMENT RIGHT  04/09/2019  . IR REMOVAL TUN CV CATH W/O FL  07/23/2018  . IR US GUIDE VASC ACCESS RIGHT  05/18/2018  . IR US GUIDE VASC ACCESS RIGHT  06/24/2019  . KNEE ARTHROSCOPY Bilateral   . PACEMAKER IMPLANT N/A 03/25/2018   Procedure: PACEMAKER IMPLANT;  Surgeon: Evans Lance, MD;  Location: [place] CV LAB;  Service: Cardiovascular;  Laterality: N/A;  . PACEMAKER PLACEMENT Left 03/2018  . POLYPECTOMY  01/28/2019   Procedure: POLYPECTOMY;  Surgeon: Mansouraty, Telford Nab., MD;  Location: [place];  Service: Gastroenterology;;  . RIGHT HEART CATH N/A 03/20/2018   Procedure: RIGHT HEART CATH;  Surgeon: Nigel Mormon, MD;  Location: [place] CV LAB;  Service: Cardiovascular;  Laterality: N/A;  . TEE WITHOUT CARDIOVERSION N/A 03/20/2018   Procedure: TRANSESOPHAGEAL ECHOCARDIOGRAM (TEE);  Surgeon: Nigel Mormon, MD;  Location[facility] ENDOSCOPY;  Service: Cardiovascular;  Laterality: N/A;  . TONSILLECTOMY    . URETEROSCOPY WITH HOLMIUM LASER LITHOTRIPSY Right 01/14/2019   Procedure: URETEROSCOPY WITH HOLMIUM LASER LITHOTRIPSY BLADDER BIOPSIES RIGHT STENT PLACEMENT;  Surgeon: Ardis Hughs, MD;  Location: [facility];  Service: Urology;  Laterality: Right;    Allergies  Allergen Reactions  .  Baclofen Other (See Comments)    somnolence  . Codeine Other (See Comments)    Increases Pain and couldn't sleep    Prior to Admission medications   Medication Sig Start Date End Date Taking? Authorizing Provider  acetaminophen (TYLENOL) 325 MG tablet Take 1-2 tablets (325-650 mg total) by mouth every 4 (four) hours as needed for mild pain. 04/30/19  Yes Love, Ivan Anchors, PA-C  cetirizine (ZYRTEC) 10 MG tablet Take 10 mg by mouth at bedtime.    Yes [provider]  ferrous sulfate 325 (65 FE) MG tablet Take 1 tablet (325 mg total) by mouth 2 (two) times daily with a meal. 04/30/19  Yes Love, Ivan Anchors, PA-C  flavoxATE (URISPAS) 100 MG tablet Take 1 tablet (100 mg total) by mouth 3 (three) times daily as needed (bladder spasms, dysuria). 04/30/19  Yes Love, Ivan Anchors, PA-C  furosemide (LASIX) 80 MG tablet Take 1 tablet (80 mg total) by mouth 2 (two) times daily. 05/10/19  Yes Adrian Prows, MD  levothyroxine (SYNTHROID) 75 MCG tablet Take 1 tablet (75 mcg total) by mouth daily before breakfast. 04/30/19  Yes Love, Ivan Anchors, PA-C  Multiple Vitamin (MULTI-VITAMIN PO) Take 1 tablet by mouth daily.   Yes [provider]  oxyCODONE (OXY IR/ROXICODONE) 5 MG immediate release tablet Take 1 tablet (5 mg total) by mouth every 6 (six) hours as needed for severe pain. 04/30/19  Yes Love, Ivan Anchors, PA-C  pantoprazole (PROTONIX) 40 MG tablet Take 40 mg by mouth 2 (two) times daily.   Yes [provider]  potassium chloride SA (K-DUR) 20 MEQ tablet Take 40 mEq by mouth 2 (two) times daily. 06/02/19  Yes [provider]  simvastatin (ZOCOR) 40 MG tablet Take 1 tablet (40 mg total) by mouth at bedtime. 04/21/19  Yes Dessa Phi, DO  Biotin 5 MG CAPS Take 1 capsule (5 mg total) by mouth daily. Patient not taking: Reported on 06/09/2019 01/14/19   Ardis Hughs, MD  cephALEXin (KEFLEX) 250 MG capsule Take 1 capsule (250 mg total) by mouth daily for 5 days. 06/19/19 06/24/19  Hosie Poisson,  MD  darifenacin (ENABLEX) 15 MG 24 hr tablet Take 1 tablet (15 mg total) by mouth daily. Patient not taking: Reported on 06/09/2019 05/01/19   Love, Ivan Anchors, PA-C  docusate sodium (COLACE) 100 MG capsule Take 1 capsule (100 mg total) by mouth 2 (two) times daily. Patient taking differently: Take 100 mg by mouth 2 (two) times daily as needed for mild constipation.  04/30/19   Love, Ivan Anchors, PA-C  fluticasone (FLONASE) 50 MCG/ACT nasal spray Place 2 sprays into both nostrils daily as needed for allergies or rhinitis.    [provider]  folic acid (FOLVITE) 1 MG tablet Take 1 tablet (1 mg total) by mouth daily. Patient not taking: Reported on 06/09/2019 06/22/18   Love, Ivan Anchors, PA-C  gabapentin (NEURONTIN) 100 MG capsule Take 1 capsule (100 mg total) by mouth 3 (three) times daily. Patient taking differently: Take 100 mg by mouth daily as needed (pain).  04/30/19   Love, Ivan Anchors,  PA-C  Menthol-Methyl Salicylate (MUSCLE RUB) 10-15 % CREA Apply 1 application topically every 4 (four) hours as needed for muscle pain. Patient not taking: Reported on 06/09/2019 04/30/19   Love, Ivan Anchors, PA-C  metolazone (ZAROXOLYN) 2.5 MG tablet Take 1 tablet (2.5 mg total) by mouth daily as needed (for weight gain more than 4 lbs .). 06/19/19   Hosie Poisson, MD  polyethylene glycol (MIRALAX / GLYCOLAX) 17 g packet Take 17 g by mouth 2 (two) times daily. Patient taking differently: Take 17 g by mouth daily as needed for mild constipation.  05/01/19   Love, Ivan Anchors, PA-C  senna (SENOKOT) 8.6 MG TABS tablet Take 1 tablet (8.6 mg total) by mouth at bedtime. 06/19/19   Hosie Poisson, MD  sodium bicarbonate 650 MG tablet Take 2 tablets (1,300 mg total) by mouth 2 (two) times daily. Patient not taking: Reported on 06/09/2019 02/19/19   Georgette Shell, MD  torsemide [facility]) 100 MG tablet Take 1 tablet (100 mg total) by mouth 2 (two) times daily. 06/19/19   Hosie Poisson, MD  vitamin B-12 (CYANOCOBALAMIN) 1000 MCG tablet  Take 1 tablet (1,000 mcg total) by mouth daily. Patient not taking: Reported on 06/09/2019 06/22/18   Love, Ivan Anchors, PA-C    Social History   Socioeconomic History  . Marital status: Widowed    Spouse name: Not on file  . Number of children: 3  . Years of education: Not on file  . Highest education level: Not on file  Occupational History  . Not on file  Social Needs  . Financial resource strain: Not on file  . Food insecurity    Worry: Not on file    Inability: Not on file  . Transportation needs    Medical: Not on file    Non-medical: Not on file  Tobacco Use  . Smoking status: Never Smoker  . Smokeless tobacco: Never Used  Substance and Sexual Activity  . Alcohol use: Not Currently  . Drug use: Never  . Sexual activity: Not Currently  Lifestyle  . Physical activity    Days per week: Not on file    Minutes per session: Not on file  . Stress: Not on file  Relationships  . Social Herbalist on phone: Not on file    Gets together: Not on file    Attends religious service: Not on file    Active member of club or organization: Not on file    Attends meetings of clubs or organizations: Not on file    Relationship status: Not on file  . Intimate partner violence    Fear of current or ex partner: Not on file    Emotionally abused: Not on file    Physically abused: Not on file    Forced sexual activity: Not on file  Other Topics Concern  . Not on file  Social History Narrative  . Not on file    Family History  Problem Relation Age of Onset  . Hypertension Mother   . Diabetes Mellitus II Father   . Heart disease Father   . Heart attack Father   . Gastric cancer Brother   . Diabetes Mellitus II Brother   . Stomach cancer Brother     ROS: [x]  Positive   [ ]  Negative   [ ]  All sytems reviewed and are negative  Cardiovascular: []  chest pain/pressure []  palpitations []  SOB lying flat []  DOE []  pain in legs while walking []  pain  in legs at rest []   pain in legs at night []  non-healing ulcers []  hx of DVT [x]  swelling in legs  Pulmonary: []  productive cough []  asthma/wheezing []  home O2  Neurologic: []  weakness in []  arms []  legs [x]  numbness in []  arms [x]  legs (both hands, states chronic neuropathy) []  hx of CVA []  mini stroke [] difficulty speaking or slurred speech []  temporary loss of vision in one eye []  dizziness  Hematologic: []  hx of cancer []  bleeding problems []  problems with blood clotting easily  Endocrine:   []  diabetes []  thyroid disease  GI []  vomiting blood []  blood in stool  GU: []  CKD/renal failure []  HD--[]  M/W/F or []  T/T/S []  burning with urination []  blood in urine  Psychiatric: []  anxiety []  depression  Musculoskeletal: []  arthritis []  joint pain  Integumentary: []  rashes []  ulcers  Constitutional: []  fever []  chills   Physical Examination  Vitals:   06/24/19 1652 06/24/19 1720  BP: 116/63 122/71  Pulse: 72 77  Resp: 17 17  Temp: 98.3 F (36.8 C) 98.3 F (36.8 C)  SpO2: 100% 99%   Body mass index is 24.95 kg/m.  General:  WDWN in NAD Gait: Not observed HENT: WNL, normocephalic Pulmonary: normal non-labored breathing, without Rales, rhonchi,  wheezing Cardiac: regular, without  Murmurs, rubs or gallops Abdomen:  soft, NT/ND, no masses Skin: without rashes Vascular Exam/Pulses:  Right Left  Radial 2+ (normal) 2+ (normal)  Ulnar 2+ (normal) 2+ (normal)  Brachial 2+ (normal) 2+ (normal)  Popliteal    DP    PT     Extremities: Significant lower extremity edema, both legs wrapped Musculoskeletal: no muscle wasting or atrophy  Neurologic: A&O X 3; Appropriate Affect ; SENSATION: normal; MOTOR FUNCTION:  moving all extremities equally. Speech is fluent/normal   CBC    Component Value Date/Time   WBC 6.1 06/24/2019 0625   RBC 2.94 (L) 06/24/2019 0625   HGB 8.6 (L) 06/24/2019 0625   HCT 27.7 (L) 06/24/2019 0625   HCT 20.1 (L) 05/13/2018 0427   PLT 408 (H)  06/24/2019 0625   MCV 94.2 06/24/2019 0625   MCH 29.3 06/24/2019 0625   MCHC 31.0 06/24/2019 0625   RDW 13.9 06/24/2019 0625   LYMPHSABS 1.2 06/24/2019 0625   MONOABS 0.6 06/24/2019 0625   EOSABS 0.2 06/24/2019 0625   BASOSABS 0.1 06/24/2019 0625    BMET    Component Value Date/Time   NA 136 06/23/2019 0455   NA 134 07/02/2018 1409   K 5.2 (H) 06/23/2019 0455   CL 102 06/23/2019 0455   CO2 22 06/23/2019 0455   GLUCOSE 115 (H) 06/23/2019 0455   BUN 111 (H) 06/23/2019 0455   BUN 48 (H) 07/02/2018 1409   CREATININE 5.56 (H) 06/23/2019 0455   CREATININE 3.24 (H) 12/21/2018 1029   CALCIUM 10.0 06/23/2019 0455   CALCIUM 9.6 10/31/2017 1050   GFRNONAA 7 (L) 06/23/2019 0455   GFRAA 8 (L) 06/23/2019 0455    COAGS: Lab Results  Component Value Date   INR 1.0 06/24/2019   INR 1.0 04/09/2019   INR 1.01 05/14/2018     Non-Invasive Vascular Imaging:    Vein mapping and upper extremity arterial duplex pending   ASSESSMENT/PLAN: This is a 73 y.o. female with multiple medical problems including late stage CKD and volume overload from heart failure.  Ultimately the plan is to initiate dialysis and she had a tunnel dialysis catheter placed by radiology today.  I have ordered vein mapping as  were well as upper extremity arterial duplex.  Patient is right-handed and we would typically place dialysis in the left arm, but she has a chronic pacemaker on the left chest wall and discussed that right arm AV fistula versus graft may be a better option.  We will likely schedule beginning of next week when there is OR availability.  Marty Heck, MD [facility] Office: [phone] Pager: [phone]

## 2019-06-24 NOTE — Progress Notes (Signed)
Inpatient Rehabilitation-Admissions Coordinator   [facility] continues to follow pt since transfer from [facility]. Met with pt at the bedside this AM and provided pt with brochures and additional information about our program. Will need to see how she tolerates initiation of HD. Will continue to follow.   [name], OTR/L  Rehab Admissions Coordinator  [phone] 06/24/2019 10:19 AM

## 2019-06-24 NOTE — Sedation Documentation (Signed)
Cardiac Rhythm: V-paced

## 2019-06-24 NOTE — Progress Notes (Signed)
Erika Fry  PROGRESS NOTE    Erika Fry  AOZ:[id number] DOB: May 13, 1946 DOA: 06/09/2019 PCP: [name], MD   Brief Narrative:   73 y.o.femalewith medical history significant ofhypertension, hyperlipidemia, diet-controlled diabetes, GERD, hypothyroidism, gout, pacemaker placement, iron deficiency anemia, CKD-4, CHF, who presents with bilateral leg edema.   Assessment & Plan:   Principal Problem:   Bilateral leg edema Active Problems:   Chronic kidney disease (CKD), stage IV (severe) (HCC)   Diet-controlled diabetes mellitus (HCC)   GERD (gastroesophageal reflux disease)   High cholesterol   Hypothyroidism   Iron deficiency anemia   Essential hypertension   UTI (urinary tract infection)   Weakness generalized   Chronic diastolic CHF (congestive heart failure) (HCC)   Cellulitis of lower extremity   Acute on chronic diastolic CHF (congestive heart failure) (HCC)   Hypomagnesemia   DNR (do not resuscitate) discussion   Cystitis   Pain in both lower extremities  Acute on chronic diastolic HF Recurrent bilateral leg edema BNP 138.5 Echo with EF of 55 to 60%, with left ventricular diastolic Doppler parameters consistent with impaired relaxation Patient has been appropriately diuresed with IV Lasix and torsemide, currently on hold due to worsening renal function Strict I's and O's, daily weights Monitor closely  AKI on STAGE 4 CKD; now ESRD Worsening creatinine Diuretics include Lasix, torsemide held Nephrology consulted, patient transferred here to [facility] for initiation of dialysis Plan for dialysis as per nephrology  Pyuria/UTI UA with large leukocytes, greater than 50 WBC, Urine cultures show multiple bacterial morphotypes Blood cultures are negative.  Short course of keflex was ordered; to be completed on 7/24    Cellulitis of bilateral lower extremities  Currently afebrile, with no leukocytosis Completed the course of  antibiotics  Hyponatremia Ongoing Likely due to volume overload Daily BMP    Type 2 diabetes mellitus Diet controlled  Hypertension Stable  Anemia of chronic kidney disease Hemoglobin at baseline Daily CBC  Hypothyroidism Continue Synthroid   GERD Continue PPI       DVT prophylaxis: heparin Code Status: FULL   Disposition Plan: TBD   Consultants:   Nephrology  Palliative care  Antimicrobials:  . Keflex    Subjective: Denies any new complaints  Objective: Vitals:   06/24/19 0059 06/24/19 0541 06/24/19 0905 06/24/19 1451  BP: 137/74 (!) 114/58 93/70 116/66  Pulse: 78 79 72 73  Resp: 19 18 19 20   Temp: 98.9 F (37.2 C) 99 F (37.2 C) 98.7 F (37.1 C)   TempSrc: Oral Oral Oral   SpO2: 96% 94% 96% 98%  Weight:      Height:        Intake/Output Summary (Last 24 hours) at 06/24/2019 1500 Last data filed at 06/24/2019 1236 Gross per 24 hour  Intake 2058.13 ml  Output 1475 ml  Net 583.13 ml   Filed Weights   06/20/19 0614 06/21/19 0700 06/23/19 0324  Weight: 67 kg 69.9 kg 68 kg    Examination:  General: NAD   Cardiovascular: S1, S2 present  Respiratory: CTAB  Abdomen: Soft, nontender, nondistended, bowel sounds present  Musculoskeletal: ++bilateral pedal edema noted, TTP bilateral feet  Skin: Normal  Psychiatry: Normal mood  Data Reviewed: I have personally reviewed following labs and imaging studies.  CBC: Recent Labs  Lab 06/20/19 1032 06/21/19 1515 06/22/19 1523 06/24/19 0625  WBC 6.9 5.9 6.3 6.1  NEUTROABS  --  3.9 4.1 3.9  HGB 8.2* 8.8* 8.6* 8.6*  HCT 25.8* 28.9* 27.8* 27.7*  MCV  93.8 94.8 93.9 94.2  PLT 262 335 348 884*   Basic Metabolic Panel: Recent Labs  Lab 06/18/19 1232 06/19/19 0605 06/20/19 0535 06/21/19 1515 06/23/19 0455  NA 131* 132* 131* 133* 136  K 4.6 4.7 4.8 5.1 5.2*  CL 97* 98 97* 98 102  CO2 18* 21* 18* 15* 22  GLUCOSE 153* 111* 96 135* 115*  BUN 88* 95* 99* 111* 111*  CREATININE  5.42* 5.42* 5.75* 5.93* 5.56*  CALCIUM 9.5 9.8 9.8 9.7 10.0  PHOS 5.8* 6.3* 6.0*  --  5.1*   GFR: Estimated Creatinine Clearance: 7.9 mL/min (A) (by C-G formula based on SCr of 5.56 mg/dL (H)). Liver Function Tests: Recent Labs  Lab 06/18/19 1232 06/19/19 0605 06/20/19 0535 06/23/19 0455  ALBUMIN 2.6* 2.7* 2.6* 2.8*   No results for input(s): LIPASE, AMYLASE in the last 168 hours. No results for input(s): AMMONIA in the last 168 hours. Coagulation Profile: Recent Labs  Lab 06/24/19 0625  INR 1.0   Cardiac Enzymes: No results for input(s): CKTOTAL, CKMB, CKMBINDEX, TROPONINI in the last 168 hours. BNP (last 3 results) No results for input(s): PROBNP in the last 8760 hours. HbA1C: No results for input(s): HGBA1C in the last 72 hours. CBG: Recent Labs  Lab 06/24/19 0059 06/24/19 0700 06/24/19 1127  GLUCAP 83 105* 89   Lipid Profile: No results for input(s): CHOL, HDL, LDLCALC, TRIG, CHOLHDL, LDLDIRECT in the last 72 hours. Thyroid Function Tests: No results for input(s): TSH, T4TOTAL, FREET4, T3FREE, THYROIDAB in the last 72 hours. Anemia Panel: No results for input(s): VITAMINB12, FOLATE, FERRITIN, TIBC, IRON, RETICCTPCT in the last 72 hours. Sepsis Labs: No results for input(s): PROCALCITON, LATICACIDVEN in the last 168 hours.  Recent Results (from the past 240 hour(s))  Culture, Urine     Status: Abnormal   Collection Time: 06/20/19  9:14 AM   Specimen: Urine, Random  Result Value Ref Range Status   Specimen Description   Final    URINE, RANDOM Performed at [facility] [address], [place], [place] [address]    Special Requests   Final    NONE Performed at [facility], [place] [address] [place], [place] [address]    Culture MULTIPLE SPECIES PRESENT, SUGGEST RECOLLECTION (A)  Final   Report Status 06/21/2019 FINAL  Final  Culture, blood (Routine X 2) w Reflex to ID Panel     Status: None (Preliminary result)    Collection Time: 06/20/19 10:30 AM   Specimen: BLOOD  Result Value Ref Range Status   Specimen Description   Final    BLOOD RIGHT ANTECUBITAL Performed at [facility], [place] [address], [place], [place] [address]    Special Requests   Final    BOTTLES DRAWN AEROBIC ONLY Blood Culture adequate volume Performed at [facility] [address], [place], [place] [address]    Culture   Final    NO GROWTH 4 DAYS Performed at [facility], [place] [address], [place], [place] [address]    Report Status PENDING  Incomplete  Culture, blood (Routine X 2) w Reflex to ID Panel     Status: None (Preliminary result)   Collection Time: 06/20/19 10:30 AM   Specimen: BLOOD  Result Value Ref Range Status   Specimen Description   Final    BLOOD LEFT ANTECUBITAL Performed at [facility], [place] [address], [place], [place] [address]    Special Requests   Final  BOTTLES DRAWN AEROBIC ONLY Blood Culture adequate volume Performed at [facility] [address] [place], [place] [address]    Culture   Final    NO GROWTH 4 DAYS Performed at [facility], [place] [address], [place], [place] [address]    Report Status PENDING  Incomplete         Radiology Studies: No results found.      Scheduled Meds: . cephALEXin  250 mg Oral Daily  . Chlorhexidine Gluconate Cloth  6 each Topical Q0600  . [START ON 06/25/2019] Chlorhexidine Gluconate Cloth  6 each Topical Q0600  . docusate sodium  100 mg Oral BID  . feeding supplement (ENSURE ENLIVE)  237 mL Oral Q24H  . feeding supplement (PRO-STAT SUGAR FREE 64)  30 mL Oral Daily  . ferrous sulfate  325 mg Oral Q breakfast  . gabapentin  100 mg Oral TID  . [START ON 06/25/2019] heparin  5,000 Units Subcutaneous Q8H  . levothyroxine  75 mcg Oral QAC breakfast  . loratadine  10 mg Oral Daily  . multivitamin with minerals  1 tablet Oral Daily  . pantoprazole  40 mg Oral Daily  . polyethylene  glycol  17 g Oral BID  . senna  1 tablet Oral QHS  . simvastatin  40 mg Oral QHS   Continuous Infusions: . sodium chloride    .  ceFAZolin (ANCEF) IV       LOS: 14 days         Alma Friendly, MD [facility]  If 7PM-7AM, please contact night-coverage [email or web address] Password [email or web address] 06/24/2019, 3:00 PM

## 2019-06-24 NOTE — Progress Notes (Signed)
New Admission Note:  Arrival Method: Stretcher/EMS Mental Orientation: Alert and oriented x 4 Telemetry: N/A Assessment: Completed Skin: Warm and dry. BLE swelling with kerlix  IV: Infusing  Pain: Denies  Tubes: Neph tube Safety Measures: Safety Fall Prevention Plan initiated.  Admission: Completed 5 M  Orientation: Patient has been orientated to the room, unit and the staff. Welcome booklet given.  Family: N/A  Orders have been reviewed and implemented. Will continue to monitor the patient. Call light has been placed within reach and bed alarm has been activated.   Sima Matas BSN, RN  Phone Number: [phone]

## 2019-06-24 NOTE — Procedures (Signed)
Pre-procedure Diagnosis: ESRD Post-procedure Diagnosis: Same  Successful placement of tunneled HD catheter with tips terminating within the superior aspect of the right atrium.    Complications: None Immediate  EBL: Minimal   The catheter is ready for immediate use.   Jay Milam Allbaugh, MD Pager #: [phone]   

## 2019-06-24 NOTE — Progress Notes (Signed)
Subjective:  No c/o today, UOP 1 L yest, I/O +700 cc.   Objective Vital signs in last 24 hours: Vitals:   06/23/19 2100 06/24/19 0059 06/24/19 0541 06/24/19 0905  BP: 138/80 137/74 (!) 114/58 93/70  Pulse: 84 78 79 72  Resp: 18 19 18 19   Temp: 98.3 F (36.8 C) 98.9 F (37.2 C) 99 F (37.2 C) 98.7 F (37.1 C)  TempSrc: Oral Oral Oral Oral  SpO2: 100% 96% 94% 96%  Weight:      Height:       Weight change:   Intake/Output Summary (Last 24 hours) at 06/24/2019 1228 Last data filed at 06/24/2019 1000 Gross per 24 hour  Intake 1928.13 ml  Output 926 ml  Net 1002.13 ml    Physical Exam: General: deconditioned but NAD- pleasant and Ox 3 Heart: RRR Lungs: are clear  Abdomen: soft, non tender Extremities: wrinkling skin bilat feet w/o edema , pretib min edema, no thigh edema   Assessment/ Plan: Pt is a 73 y.o. yo female with late stage CKD and lots of difficulty w/ overdiuresis then vol overload and back and forth. Decision made to go forward with initiation of dialysis.   Assessment/Plan:  Advanced CKD: new start to dialysis, IR consulting for [facility]. Will plan 1st HD after this is in, orders written.  Will consult [facility] for perm access prior to dc. Gentle HD today, Fri and Sat  HTN/vol- BP's labile, looks euvolemic on exam  Hyperkalemia: mild, changed to renal diet  Interstitial cystitis: reportedly a big problem, frequent urination may be issue while on HD.  Spoke w/ daughter however who stated she was under the impression that the diuretics were the issue. Diuretics will be dc'd now that she is on HD, perhaps this will help.  Anemia- stable in the 9's   Deconditioning- chronic issue- PT/OT   Prognosis - have d/w Dr Johnney Ou and pt's prognosis on hemodialysis may not be the greatest, I have d/w the family and they were aware of this and asked relevant questions regarding what happens if she doesn't tolerate dialysis  Kelly Splinter, MD 06/24/2019, 12:28 PM   Labs: Basic  Metabolic Panel: Recent Labs  Lab 06/19/19 0605 06/20/19 0535 06/21/19 1515 06/23/19 0455  NA 132* 131* 133* 136  K 4.7 4.8 5.1 5.2*  CL 98 97* 98 102  CO2 21* 18* 15* 22  GLUCOSE 111* 96 135* 115*  BUN 95* 99* 111* 111*  CREATININE 5.42* 5.75* 5.93* 5.56*  CALCIUM 9.8 9.8 9.7 10.0  PHOS 6.3* 6.0*  --  5.1*   Liver Function Tests: Recent Labs  Lab 06/19/19 0605 06/20/19 0535 06/23/19 0455  ALBUMIN 2.7* 2.6* 2.8*   No results for input(s): LIPASE, AMYLASE in the last 168 hours. No results for input(s): AMMONIA in the last 168 hours. CBC: Recent Labs  Lab 06/20/19 1032 06/21/19 1515 06/22/19 1523 06/24/19 0625  WBC 6.9 5.9 6.3 6.1  NEUTROABS  --  3.9 4.1 3.9  HGB 8.2* 8.8* 8.6* 8.6*  HCT 25.8* 28.9* 27.8* 27.7*  MCV 93.8 94.8 93.9 94.2  PLT 262 335 348 408*   Cardiac Enzymes: No results for input(s): CKTOTAL, CKMB, CKMBINDEX, TROPONINI in the last 168 hours. CBG: Recent Labs  Lab 06/24/19 0059 06/24/19 0700 06/24/19 1127  GLUCAP 83 105* 89    Iron Studies: No results for input(s): IRON, TIBC, TRANSFERRIN, FERRITIN in the last 72 hours. Studies/Results: No results found. Medications: Infusions: . sodium chloride    . sodium chloride  50 mL/hr at 06/24/19 1000  .  ceFAZolin (ANCEF) IV      Scheduled Medications: . cephALEXin  250 mg Oral Daily  . Chlorhexidine Gluconate Cloth  6 each Topical Q0600  . docusate sodium  100 mg Oral BID  . feeding supplement (ENSURE ENLIVE)  237 mL Oral Q24H  . feeding supplement (PRO-STAT SUGAR FREE 64)  30 mL Oral Daily  . ferrous sulfate  325 mg Oral Q breakfast  . gabapentin  100 mg Oral TID  . [START ON 06/25/2019] heparin  5,000 Units Subcutaneous Q8H  . levothyroxine  75 mcg Oral QAC breakfast  . loratadine  10 mg Oral Daily  . multivitamin with minerals  1 tablet Oral Daily  . pantoprazole  40 mg Oral Daily  . polyethylene glycol  17 g Oral BID  . senna  1 tablet Oral QHS  . simvastatin  40 mg Oral QHS     have reviewed scheduled and prn medications.   06/24/2019,12:28 PM  LOS: 14 days

## 2019-06-24 NOTE — Consult Note (Signed)
Chief Complaint: Patient was seen in consultation today for tunneled HD catheter placement Chief Complaint  Patient presents with  . Leg Swelling  . Leg Pain   at the request of Dr Mickel Crow   Supervising Physician: Sandi Mariscal  Patient Status: [facility] - In-pt  History of Present Illness: Erika Fry is a 73 y.o. female   HTN; HLD; DM Pacemaker, IDA 04/09/19 R PCN placed -- stones CKD4 Bilat leg edema x 1 week  Has been treated for leg edema-- diuretics Cr worsened nephro consulted -- and now feel need to initiate dialysis  Worsening renal function Progressive renal failure  Request for tunneled Hemo dialysis catheter placement  Past Medical History:  Diagnosis Date  . Arthritis    "knees, thumbs" (03/25/2018)  . CKD (chronic kidney disease), stage IV ([place])   . Diet-controlled diabetes mellitus ([place])   . GERD (gastroesophageal reflux disease)   . Gout    "on daily RX" (03/25/2018)  . Heart murmur   . High cholesterol   . Hypertension   . Hypothyroidism   . Iron deficiency anemia    "had to get an iron infusion"  . Migraine    "used to have them growing up" (03/25/2018)  . Presence of permanent cardiac pacemaker 03/25/2018    Past Surgical History:  Procedure Laterality Date  . APPENDECTOMY    . BIOPSY  05/19/2018   Procedure: BIOPSY;  Surgeon: Ladene Artist, MD;  Location: [place];  Service: Endoscopy;;  . Consuela Mimes W/ RETROGRADES Bilateral 01/14/2019   Procedure: CYSTOSCOPY WITH RETROGRADE PYELOGRAM BILATERAL HYDRODISTENTION;  Surgeon: Ardis Hughs, MD;  Location: [facility];  Service: Urology;  Laterality: Bilateral;  . ESOPHAGOGASTRODUODENOSCOPY N/A 05/19/2018   Procedure: ESOPHAGOGASTRODUODENOSCOPY (EGD);  Surgeon: Ladene Artist, MD;  Location: [facility] ENDOSCOPY;  Service: Endoscopy;  Laterality: N/A;  . ESOPHAGOGASTRODUODENOSCOPY (EGD) WITH PROPOFOL N/A 01/28/2019   Procedure: ESOPHAGOGASTRODUODENOSCOPY (EGD) WITH PROPOFOL;  Surgeon:  Rush Landmark Telford Nab., MD;  Location: [place];  Service: Gastroenterology;  Laterality: N/A;  . INSERT / REPLACE / REMOVE PACEMAKER  03/25/2018  . IR FLUORO GUIDE CV LINE RIGHT  05/18/2018  . IR LUMBAR DISC ASPIRATION W/IMG GUIDE  05/15/2018  . IR NEPHROSTOMY EXCHANGE RIGHT  05/28/2019  . IR NEPHROSTOMY PLACEMENT RIGHT  04/09/2019  . IR REMOVAL TUN CV CATH W/O FL  07/23/2018  . IR US GUIDE VASC ACCESS RIGHT  05/18/2018  . KNEE ARTHROSCOPY Bilateral   . PACEMAKER IMPLANT N/A 03/25/2018   Procedure: PACEMAKER IMPLANT;  Surgeon: Evans Lance, MD;  Location: [place] CV LAB;  Service: Cardiovascular;  Laterality: N/A;  . PACEMAKER PLACEMENT Left 03/2018  . POLYPECTOMY  01/28/2019   Procedure: POLYPECTOMY;  Surgeon: Mansouraty, Telford Nab., MD;  Location: [place];  Service: Gastroenterology;;  . RIGHT HEART CATH N/A 03/20/2018   Procedure: RIGHT HEART CATH;  Surgeon: Nigel Mormon, MD;  Location: [place] CV LAB;  Service: Cardiovascular;  Laterality: N/A;  . TEE WITHOUT CARDIOVERSION N/A 03/20/2018   Procedure: TRANSESOPHAGEAL ECHOCARDIOGRAM (TEE);  Surgeon: Nigel Mormon, MD;  Location: [facility] ENDOSCOPY;  Service: Cardiovascular;  Laterality: N/A;  . TONSILLECTOMY    . URETEROSCOPY WITH HOLMIUM LASER LITHOTRIPSY Right 01/14/2019   Procedure: URETEROSCOPY WITH HOLMIUM LASER LITHOTRIPSY BLADDER BIOPSIES RIGHT STENT PLACEMENT;  Surgeon: Ardis Hughs, MD;  Location: [facility];  Service: Urology;  Laterality: Right;    Allergies: Baclofen and Codeine  Medications: Prior to Admission medications   Medication Sig Start Date End  Date Taking? Authorizing Provider  acetaminophen (TYLENOL) 325 MG tablet Take 1-2 tablets (325-650 mg total) by mouth every 4 (four) hours as needed for mild pain. 04/30/19  Yes Love, Ivan Anchors, PA-C  cetirizine (ZYRTEC) 10 MG tablet Take 10 mg by mouth at bedtime.    Yes [provider]  ferrous sulfate 325 (65 FE) MG tablet Take 1 tablet  (325 mg total) by mouth 2 (two) times daily with a meal. 04/30/19  Yes Love, Ivan Anchors, PA-C  flavoxATE (URISPAS) 100 MG tablet Take 1 tablet (100 mg total) by mouth 3 (three) times daily as needed (bladder spasms, dysuria). 04/30/19  Yes Love, Ivan Anchors, PA-C  furosemide (LASIX) 80 MG tablet Take 1 tablet (80 mg total) by mouth 2 (two) times daily. 05/10/19  Yes Adrian Prows, MD  levothyroxine (SYNTHROID) 75 MCG tablet Take 1 tablet (75 mcg total) by mouth daily before breakfast. 04/30/19  Yes Love, Ivan Anchors, PA-C  Multiple Vitamin (MULTI-VITAMIN PO) Take 1 tablet by mouth daily.   Yes [provider]  oxyCODONE (OXY IR/ROXICODONE) 5 MG immediate release tablet Take 1 tablet (5 mg total) by mouth every 6 (six) hours as needed for severe pain. 04/30/19  Yes Love, Ivan Anchors, PA-C  pantoprazole (PROTONIX) 40 MG tablet Take 40 mg by mouth 2 (two) times daily.   Yes [provider]  potassium chloride SA (K-DUR) 20 MEQ tablet Take 40 mEq by mouth 2 (two) times daily. 06/02/19  Yes [provider]  simvastatin (ZOCOR) 40 MG tablet Take 1 tablet (40 mg total) by mouth at bedtime. 04/21/19  Yes Dessa Phi, DO  Biotin 5 MG CAPS Take 1 capsule (5 mg total) by mouth daily. Patient not taking: Reported on 06/09/2019 01/14/19   Ardis Hughs, MD  cephALEXin (KEFLEX) 250 MG capsule Take 1 capsule (250 mg total) by mouth daily for 5 days. 06/19/19 06/24/19  Hosie Poisson, MD  darifenacin (ENABLEX) 15 MG 24 hr tablet Take 1 tablet (15 mg total) by mouth daily. Patient not taking: Reported on 06/09/2019 05/01/19   Love, Ivan Anchors, PA-C  docusate sodium (COLACE) 100 MG capsule Take 1 capsule (100 mg total) by mouth 2 (two) times daily. Patient taking differently: Take 100 mg by mouth 2 (two) times daily as needed for mild constipation.  04/30/19   Love, Ivan Anchors, PA-C  fluticasone (FLONASE) 50 MCG/ACT nasal spray Place 2 sprays into both nostrils daily as needed for allergies or rhinitis.    [provider]  folic acid (FOLVITE) 1 MG tablet Take 1 tablet (1 mg total) by mouth daily. Patient not taking: Reported on 06/09/2019 06/22/18   Love, Ivan Anchors, PA-C  gabapentin (NEURONTIN) 100 MG capsule Take 1 capsule (100 mg total) by mouth 3 (three) times daily. Patient taking differently: Take 100 mg by mouth daily as needed (pain).  04/30/19   Love, Ivan Anchors, PA-C  Menthol-Methyl Salicylate (MUSCLE RUB) 10-15 % CREA Apply 1 application topically every 4 (four) hours as needed for muscle pain. Patient not taking: Reported on 06/09/2019 04/30/19   Love, Ivan Anchors, PA-C  metolazone (ZAROXOLYN) 2.5 MG tablet Take 1 tablet (2.5 mg total) by mouth daily as needed (for weight gain more than 4 lbs .). 06/19/19   Hosie Poisson, MD  polyethylene glycol (MIRALAX / GLYCOLAX) 17 g packet Take 17 g by mouth 2 (two) times daily. Patient taking differently: Take 17 g by mouth daily as needed for mild constipation.  05/01/19   Love,  Ivan Anchors, PA-C  senna (SENOKOT) 8.6 MG TABS tablet Take 1 tablet (8.6 mg total) by mouth at bedtime. 06/19/19   Hosie Poisson, MD  sodium bicarbonate 650 MG tablet Take 2 tablets (1,300 mg total) by mouth 2 (two) times daily. Patient not taking: Reported on 06/09/2019 02/19/19   Georgette Shell, MD  torsemide [facility]) 100 MG tablet Take 1 tablet (100 mg total) by mouth 2 (two) times daily. 06/19/19   Hosie Poisson, MD  vitamin B-12 (CYANOCOBALAMIN) 1000 MCG tablet Take 1 tablet (1,000 mcg total) by mouth daily. Patient not taking: Reported on 06/09/2019 06/22/18   Bary Leriche, PA-C     Family History  Problem Relation Age of Onset  . Hypertension Mother   . Diabetes Mellitus II Father   . Heart disease Father   . Heart attack Father   . Gastric cancer Brother   . Diabetes Mellitus II Brother   . Stomach cancer Brother     Social History   Socioeconomic History  . Marital status: Widowed    Spouse name: Not on file  . Number of children: 3  . Years of education: Not on  file  . Highest education level: Not on file  Occupational History  . Not on file  Social Needs  . Financial resource strain: Not on file  . Food insecurity    Worry: Not on file    Inability: Not on file  . Transportation needs    Medical: Not on file    Non-medical: Not on file  Tobacco Use  . Smoking status: Never Smoker  . Smokeless tobacco: Never Used  Substance and Sexual Activity  . Alcohol use: Not Currently  . Drug use: Never  . Sexual activity: Not Currently  Lifestyle  . Physical activity    Days per week: Not on file    Minutes per session: Not on file  . Stress: Not on file  Relationships  . Social Herbalist on phone: Not on file    Gets together: Not on file    Attends religious service: Not on file    Active member of club or organization: Not on file    Attends meetings of clubs or organizations: Not on file    Relationship status: Not on file  Other Topics Concern  . Not on file  Social History Narrative  . Not on file    Review of Systems: A 12 point ROS discussed and pertinent positives are indicated in the HPI above.  All other systems are negative.  Review of Systems  Constitutional: Positive for activity change and fatigue. Negative for fever.  Respiratory: Negative for shortness of breath.   Cardiovascular: Positive for leg swelling.  Neurological: Positive for weakness.  Psychiatric/Behavioral: Negative for behavioral problems and confusion.    Vital Signs: BP (!) 114/58 (BP Location: Right Arm)   Pulse 79   Temp 99 F (37.2 C) (Oral)   Resp 18   Ht 5\' 5"  (1.651 m)   Wt 149 lb 14.6 oz (68 kg)   SpO2 94%   BMI 24.95 kg/m   Physical Exam Vitals signs reviewed.  Cardiovascular:     Rate and Rhythm: Normal rate and regular rhythm.     Heart sounds: Normal heart sounds.  Abdominal:     General: There is no distension.  Musculoskeletal:        General: Swelling present.     Right lower leg: Edema present.  Left  lower leg: Edema present.  Skin:    General: Skin is warm and dry.  Neurological:     Mental Status: She is alert and oriented to person, place, and time.  Psychiatric:        Behavior: Behavior normal.     Imaging: Dg Chest 2 View  Result Date: 06/20/2019 CLINICAL DATA:  Fever. EXAM: CHEST - 2 VIEW COMPARISON:  Radiographs of February 10, 2019. FINDINGS: Stable cardiomegaly. Left-sided pacemaker is unchanged in position. No pneumothorax is noted. Right lung is clear. Mild left basilar atelectasis or infiltrate is noted with possible pleural effusion. Bony thorax unremarkable. IMPRESSION: Mild left basilar subsegmental atelectasis or infiltrate is noted with possible left pleural effusion. Aortic Atherosclerosis (ICD10-I70.0). Electronically Signed   By: Marijo Conception M.D.   On: 06/20/2019 11:39   Ir Nephrostomy Exchange Right  Result Date: 05/28/2019 INDICATION: Chronic right hydro, UPJ obstruction EXAM: FLUOROSCOPIC EXCHANGE RIGHT NEPHROSTOMY COMPARISON:  05/10/2019 MEDICATIONS: 1% LIDOCAINE LOCAL ANESTHESIA/SEDATION: Moderate Sedation Time:  NONE. The patient was continuously monitored during the procedure by the interventional radiology nurse under my direct supervision. CONTRAST:  10 cc omni 300-administered into the collecting system(s) FLUOROSCOPY TIME:  Fluoroscopy Time: 0 minutes 18 seconds (59 mGy). COMPLICATIONS: None immediate. PROCEDURE: Informed written consent was obtained from the patient after a thorough discussion of the procedural risks, benefits and alternatives. All questions were addressed. Maximal Sterile Barrier Technique was utilized including caps, mask, sterile gowns, sterile gloves, sterile drape, hand hygiene and skin antiseptic. A timeout was performed prior to the initiation of the procedure. Under sterile conditions and local anesthesia, the existing right nephrostomy was injected with contrast confirming position in the renal pelvis. Catheter was exchanged over a  Amplatz guidewire for a new 10 French catheter. Retention loop formed the renal pelvis. Contrast injection confirms position. UPJ obstruction remains. Catheter secured with Prolene suture and connected to external gravity drainage bag. Sterile dressing applied. No immediate complication. IMPRESSION: Successful fluoroscopic exchange 10 French right nephrostomy Electronically Signed   By: Jerilynn Mages.  Shick M.D.   On: 05/28/2019 13:12   Vas [place] Lower Extremity Venous (dvt) (only Mc & Wl 7a-7p)  Result Date: 06/10/2019  Lower Venous Study Indications: Swelling, and bilateral lower extremity blisters.  Limitations: Body habitus, poor ultrasound/tissue interface and open wound. Comparison Study: bilateral lower extremity venous duplex 06/07/2018. Performing Technologist: Maudry Mayhew MHA, RDMS, RVT, RDCS  Examination Guidelines: A complete evaluation includes B-mode imaging, spectral Doppler, color Doppler, and power Doppler as needed of all accessible portions of each vessel. Bilateral testing is considered an integral part of a complete examination. Limited examinations for reoccurring indications may be performed as noted.  +---------+---------------+---------+-----------+----------+--------------+ RIGHT    CompressibilityPhasicitySpontaneityPropertiesSummary        +---------+---------------+---------+-----------+----------+--------------+ CFV      Full                                                        +---------+---------------+---------+-----------+----------+--------------+ SFJ      Full                                                        +---------+---------------+---------+-----------+----------+--------------+ FV  Prox  Full                                                        +---------+---------------+---------+-----------+----------+--------------+ FV Mid   Full                                                         +---------+---------------+---------+-----------+----------+--------------+ FV DistalFull                                                        +---------+---------------+---------+-----------+----------+--------------+ PFV      Full                                                        +---------+---------------+---------+-----------+----------+--------------+ POP      Full                                                        +---------+---------------+---------+-----------+----------+--------------+ PTV                                                   Not visualized +---------+---------------+---------+-----------+----------+--------------+ PERO                                                  Not visualized +---------+---------------+---------+-----------+----------+--------------+   +---------+---------------+---------+-----------+----------+-------+ LEFT     CompressibilityPhasicitySpontaneityPropertiesSummary +---------+---------------+---------+-----------+----------+-------+ CFV      Full           Yes      Yes                          +---------+---------------+---------+-----------+----------+-------+ SFJ      Full                                                 +---------+---------------+---------+-----------+----------+-------+ FV Prox  Full                                                 +---------+---------------+---------+-----------+----------+-------+ FV Mid   Full                                                 +---------+---------------+---------+-----------+----------+-------+  FV DistalFull                                                 +---------+---------------+---------+-----------+----------+-------+ PFV      Full                                                 +---------+---------------+---------+-----------+----------+-------+ POP      Full           Yes      Yes                           +---------+---------------+---------+-----------+----------+-------+ PTV      Full                                                 +---------+---------------+---------+-----------+----------+-------+ PERO     Full                                                 +---------+---------------+---------+-----------+----------+-------+     Summary: Right: There is no evidence of deep vein thrombosis in the lower extremity. However, portions of this examination were limited- see technologist comments above. No cystic structure found in the popliteal fossa. Left: There is no evidence of deep vein thrombosis in the lower extremity. However, portions of this examination were limited- see technologist comments above. No cystic structure found in the popliteal fossa.  *See table(s) above for measurements and observations. Electronically signed by Servando Snare MD on 06/10/2019 at 5:10:10 PM.    Final     Labs:  CBC: Recent Labs    06/13/19 0801 06/20/19 1032 06/21/19 1515 06/22/19 1523  WBC 8.1 6.9 5.9 6.3  HGB 9.5* 8.2* 8.8* 8.6*  HCT 29.9* 25.8* 28.9* 27.8*  PLT 217 262 335 348    COAGS: Recent Labs    04/09/19 0751 06/24/19 0625  INR 1.0 1.0  APTT 30  --     BMP: Recent Labs    06/19/19 0605 06/20/19 0535 06/21/19 1515 06/23/19 0455  NA 132* 131* 133* 136  K 4.7 4.8 5.1 5.2*  CL 98 97* 98 102  CO2 21* 18* 15* 22  GLUCOSE 111* 96 135* 115*  BUN 95* 99* 111* 111*  CALCIUM 9.8 9.8 9.7 10.0  CREATININE 5.42* 5.75* 5.93* 5.56*  GFRNONAA 7* 7* 6* 7*  GFRAA 8* 8* 7* 8*    LIVER FUNCTION TESTS: Recent Labs    02/16/19 0814 02/17/19 0508 02/18/19 0452  04/22/19 0442  06/18/19 1232 06/19/19 0605 06/20/19 0535 06/23/19 0455  BILITOT 0.8 0.9 0.4  --  0.5  --   --   --   --   --   AST 12* 11* 16  --  14*  --   --   --   --   --   ALT 9 7 7   --  10  --   --   --   --   --  ALKPHOS 84 80 69  --  84  --   --   --   --   --   PROT 6.0* 6.8 6.7  --  4.6*  --   --   --    --   --   ALBUMIN 2.4*  2.4* 3.3*  3.3* 4.1  4.1   < > 1.9*   < > 2.6* 2.7* 2.6* 2.8*   < > = values in this interval not displayed.    TUMOR MARKERS: No results for input(s): AFPTM, CEA, CA199, CHROMGRNA in the last 8760 hours.  Assessment and Plan:  CKD4 Worsening function Progressive renal failure Need to initiate hemodialysis per Nephrology Scheduled for tunneled HD catheter placement Risks and benefits discussed with the patient including, but not limited to bleeding, infection, vascular injury, pneumothorax which may require chest tube placement, air embolism or even death  All of the patient's questions were answered, patient is agreeable to proceed. Consent signed and in chart.   Thank you for this interesting consult.  I greatly enjoyed meeting Erika Fry and look forward to participating in their care.  A copy of this report was sent to the requesting provider on this date.  Electronically Signed: Lavonia Drafts, PA-C 06/24/2019, 8:38 AM   I spent a total of 20 Minutes    in face to face in clinical consultation, greater than 50% of which was counseling/coordinating care for HD catheter - tunneled

## 2019-06-24 NOTE — Progress Notes (Signed)
Patient ID: Erika Fry, female   DOB: Nov 13, 1946, 73 y.o.   MRN: [id number]  This NP visited patient at the bedside as a follow up to for palliative medicine needs and emotional support.  Patient is alert and oriented and pleasant as always.  She is awaiting HD cath placement and start of dialysis,  "I hope this makes me feel better"  Patient is open to all offered and available medical interventions to prolong life.  Created space and opportunity for Ms Demmer to explore her thoughts and feeling regarding her current medical situation "this is hard"  Emotional support offered.  Discussed coping strategies.   Patient has multiple comorbidities which makes her high risk for decompensation. Disposition and anticipatory care needs remain a concern for patient and her family.  Patient remains weak and deconditioned,  Is hopeful for CIR for rehab.  Discussed with patient the importance of self motivated exercise and mobility and sitting up in the chair during the day.   Plan of care: -Patient remains full code--encourage patient and family to consider DNR/DNI status knowing poor outcomes in similar patients -Patient and family are open to all offered and available medical interventions to prolong life, all remain hopeful for improvement - initiate HD -Transition to CIR if eligible   Again discussed with patient the importance of continued conversation with her family and their  medical providers regarding overall plan of care and treatment options,  ensuring decisions are within the context of the patients values and GOCs.  Questions and concerns addressed   Updated DIL/Jennifer  Total time spent on the unit was 40 minutes  Greater than 50% of the time was spent in counseling and coordination of care  Wadie Lessen NP  Palliative Medicine Team Team Phone # [phone] Pager [phone]

## 2019-06-25 ENCOUNTER — Inpatient Hospital Stay (HOSPITAL_COMMUNITY): Payer: Medicare Other

## 2019-06-25 DIAGNOSIS — Z01810 Encounter for preprocedural cardiovascular examination: Secondary | ICD-10-CM

## 2019-06-25 DIAGNOSIS — Z992 Dependence on renal dialysis: Secondary | ICD-10-CM

## 2019-06-25 DIAGNOSIS — N186 End stage renal disease: Secondary | ICD-10-CM

## 2019-06-25 LAB — GLUCOSE, CAPILLARY
Glucose-Capillary: 98 mg/dL (ref 70–99)
Glucose-Capillary: 101 mg/dL — ABNORMAL HIGH (ref 70–99)
Glucose-Capillary: 156 mg/dL — ABNORMAL HIGH (ref 70–99)
Glucose-Capillary: 102 mg/dL — ABNORMAL HIGH (ref 70–99)

## 2019-06-25 LAB — BASIC METABOLIC PANEL
Anion gap: 13 (ref 5–15)
BUN: 87 mg/dL — ABNORMAL HIGH (ref 8–23)
CO2: 20 mmol/L — ABNORMAL LOW (ref 22–32)
Calcium: 10.2 mg/dL (ref 8.9–10.3)
Chloride: 102 mmol/L (ref 98–111)
Creatinine, Ser: 4.41 mg/dL — ABNORMAL HIGH (ref 0.44–1.00)
GFR calc Af Amer: 11 mL/min — ABNORMAL LOW (ref >60)
GFR calc non Af Amer: 9 mL/min — ABNORMAL LOW (ref >60)
Glucose, Bld: 99 mg/dL (ref 70–99)
Potassium: 4.6 mmol/L (ref 3.5–5.1)
Sodium: 135 mmol/L (ref 135–145)

## 2019-06-25 LAB — CULTURE, BLOOD (ROUTINE X 2)
Culture: NO GROWTH
Culture: NO GROWTH
Special Requests: ADEQUATE
Special Requests: ADEQUATE

## 2019-06-25 LAB — CBC WITH DIFFERENTIAL/PLATELET
Abs Immature Granulocytes: 0.14 10*3/uL — ABNORMAL HIGH (ref 0.00–0.07)
Basophils Absolute: 0.1 10*3/uL (ref 0.0–0.1)
Basophils Relative: 1 %
Eosinophils Absolute: 0.2 10*3/uL (ref 0.0–0.5)
Eosinophils Relative: 3 %
HCT: 28 % — ABNORMAL LOW (ref 36.0–46.0)
Hemoglobin: 8.6 g/dL — ABNORMAL LOW (ref 12.0–15.0)
Immature Granulocytes: 2 %
Lymphocytes Relative: 18 %
Lymphs Abs: 1.2 10*3/uL (ref 0.7–4.0)
MCH: 29 pg (ref 26.0–34.0)
MCHC: 30.7 g/dL (ref 30.0–36.0)
MCV: 94.3 fL (ref 80.0–100.0)
Monocytes Absolute: 0.7 10*3/uL (ref 0.1–1.0)
Monocytes Relative: 10 %
Neutro Abs: 4.4 10*3/uL (ref 1.7–7.7)
Neutrophils Relative %: 66 %
Platelets: 429 10*3/uL — ABNORMAL HIGH (ref 150–400)
RBC: 2.97 MIL/uL — ABNORMAL LOW (ref 3.87–5.11)
RDW: 14 % (ref 11.5–15.5)
WBC: 6.8 10*3/uL (ref 4.0–10.5)
nRBC: 0 % (ref 0.0–0.2)

## 2019-06-25 MED ORDER — ACETAMINOPHEN 325 MG PO TABS
ORAL_TABLET | ORAL | Status: AC
  Filled 2019-06-25: qty 2

## 2019-06-25 MED ORDER — CHLORHEXIDINE GLUCONATE CLOTH 2 % EX PADS
6.0000 | MEDICATED_PAD | Freq: Every day | CUTANEOUS | Status: DC
  Administered 2019-06-26 – 2019-06-28 (×3): 6 via TOPICAL

## 2019-06-25 MED ORDER — HEPARIN SODIUM (PORCINE) 1000 UNIT/ML DIALYSIS: 2000.0000 [IU] | INTRAMUSCULAR | Status: DC | PRN

## 2019-06-25 NOTE — Progress Notes (Signed)
.  PROGRESS NOTE    Erika Fry  MRN:[id number] DOB: 07/12/1946 DOA: 06/09/2019 PCP: Avva, Ravisankar, MD   Brief Narrative:   73 y.o.femalewith medical history significant ofhypertension, hyperlipidemia, diet-controlled diabetes, GERD, hypothyroidism, gout, pacemaker placement, iron deficiency anemia, CKD-4, CHF, who presents with bilateral leg edema.   Assessment & Plan:   Principal Problem:   Bilateral leg edema Active Problems:   Chronic kidney disease (CKD), stage IV (severe) (HCC)   Diet-controlled diabetes mellitus (HCC)   GERD (gastroesophageal reflux disease)   High cholesterol   Hypothyroidism   Iron deficiency anemia   Essential hypertension   UTI (urinary tract infection)   Weakness generalized   Chronic diastolic CHF (congestive heart failure) (HCC)   Cellulitis of lower extremity   Acute on chronic diastolic CHF (congestive heart failure) (HCC)   Hypomagnesemia   DNR (do not resuscitate) discussion   Cystitis   Pain in both lower extremities   CKD (chronic kidney disease) stage V requiring chronic dialysis (HCC)  Acute on chronic diastolic HF Recurrent bilateral leg edema BNP 138.5 Echo with EF of 55 to 60%, with left ventricular diastolic Doppler parameters consistent with impaired relaxation Patient has been appropriately diuresed with IV Lasix and torsemide, currently on hold due to worsening renal function Strict I's and O's, daily weights Monitor closely  AKI on STAGE 4 CKD; now ESRD Worsening creatinine, started 1st dialysis session this morning Diuretics include Lasix, torsemide held Nephrology on board  Pyuria/UTI UA with large leukocytes, greater than 50 WBC, Urine cultures show multiple bacterial morphotypes Blood cultures are negative.  Short course of keflex was ordered; to be completed on 7/24    Cellulitis of bilateral lower extremities  Currently afebrile, with no leukocytosis Completed the course of  antibiotics  Hyponatremia Resolved Likely due to volume overload Daily BMP    Type 2 diabetes mellitus Diet controlled  Hypertension Stable  Anemia of chronic kidney disease Hemoglobin at baseline Daily CBC  Hypothyroidism Continue Synthroid   GERD Continue PPI       DVT prophylaxis: heparin Code Status: FULL   Disposition Plan: TBD   Consultants:   Nephrology  Palliative care  Antimicrobials:  . Keflex    Subjective: Denies any new complaints  Objective: Vitals:   06/25/19 0930 06/25/19 1000 06/25/19 1005 06/25/19 1039  BP: 121/61 116/73 124/66 131/72  Pulse: 66 67 62 63  Resp: 13 17 18 16  Temp:  98.3 F (36.8 C)  98.3 F (36.8 C)  TempSrc:  Oral  Oral  SpO2:  99%  99%  Weight:  75.2 kg    Height:        Intake/Output Summary (Last 24 hours) at 06/25/2019 1614 Last data filed at 06/25/2019 1522 Gross per 24 hour  Intake 1737.03 ml  Output 1850 ml  Net -112.97 ml   Filed Weights   06/25/19 0507 06/25/19 0725 06/25/19 1000  Weight: 73 kg 75.2 kg 75.2 kg    Examination:  General: NAD   Cardiovascular: S1, S2 present  Respiratory: CTAB  Abdomen: Soft, nontender, nondistended, bowel sounds present  Musculoskeletal: ++bilateral pedal edema noted, in ace wrap, TTP bilateral feet  Skin: Normal  Psychiatry: Normal mood  Data Reviewed: I have personally reviewed following labs and imaging studies.  CBC: Recent Labs  Lab 06/20/19 1032 06/21/19 1515 06/22/19 1523 06/24/19 0625 06/25/19 0619  WBC 6.9 5.9 6.3 6.1 6.8  NEUTROABS  --  3.9 4.1 3.9 4.4  HGB 8.2* 8.8* 8.6* 8.6* 8.6*    HCT 25.8* 28.9* 27.8* 27.7* 28.0*  MCV 93.8 94.8 93.9 94.2 94.3  PLT 262 335 348 408* 429*   Basic Metabolic Panel: Recent Labs  Lab 06/19/19 0605 06/20/19 0535 06/21/19 1515 06/23/19 0455 06/25/19 0619  NA 132* 131* 133* 136 135  K 4.7 4.8 5.1 5.2* 4.6  CL 98 97* 98 102 102  CO2 21* 18* 15* 22 20*  GLUCOSE 111* 96 135* 115* 99  BUN  95* 99* 111* 111* 87*  CREATININE 5.42* 5.75* 5.93* 5.56* 4.41*  CALCIUM 9.8 9.8 9.7 10.0 10.2  PHOS 6.3* 6.0*  --  5.1*  --    GFR: Estimated Creatinine Clearance: 11.2 mL/min (A) (by C-G formula based on SCr of 4.41 mg/dL (H)). Liver Function Tests: Recent Labs  Lab 06/19/19 0605 06/20/19 0535 06/23/19 0455  ALBUMIN 2.7* 2.6* 2.8*   No results for input(s): LIPASE, AMYLASE in the last 168 hours. No results for input(s): AMMONIA in the last 168 hours. Coagulation Profile: Recent Labs  Lab 06/24/19 0625  INR 1.0   Cardiac Enzymes: No results for input(s): CKTOTAL, CKMB, CKMBINDEX, TROPONINI in the last 168 hours. BNP (last 3 results) No results for input(s): PROBNP in the last 8760 hours. HbA1C: No results for input(s): HGBA1C in the last 72 hours. CBG: Recent Labs  Lab 06/24/19 1127 06/24/19 1706 06/24/19 2057 06/25/19 0652 06/25/19 1132  GLUCAP 89 95 127* 98 101*   Lipid Profile: No results for input(s): CHOL, HDL, LDLCALC, TRIG, CHOLHDL, LDLDIRECT in the last 72 hours. Thyroid Function Tests: No results for input(s): TSH, T4TOTAL, FREET4, T3FREE, THYROIDAB in the last 72 hours. Anemia Panel: No results for input(s): VITAMINB12, FOLATE, FERRITIN, TIBC, IRON, RETICCTPCT in the last 72 hours. Sepsis Labs: No results for input(s): PROCALCITON, LATICACIDVEN in the last 168 hours.  Recent Results (from the past 240 hour(s))  Culture, Urine     Status: Abnormal   Collection Time: 06/20/19  9:14 AM   Specimen: Urine, Random  Result Value Ref Range Status   Specimen Description   Final    URINE, RANDOM Performed at [facility], [address] [place], [place] [address]    Special Requests   Final    NONE Performed at [facility], [address] [place], [place] [address]    Culture MULTIPLE SPECIES PRESENT, SUGGEST RECOLLECTION (A)  Final   Report Status 06/21/2019 FINAL  Final  Culture, blood (Routine X 2) w Reflex to  ID Panel     Status: None   Collection Time: 06/20/19 10:30 AM   Specimen: BLOOD  Result Value Ref Range Status   Specimen Description   Final    BLOOD RIGHT ANTECUBITAL Performed at [facility], [address] [place], [place] [address]    Special Requests   Final    BOTTLES DRAWN AEROBIC ONLY Blood Culture adequate volume Performed at [facility], [address] [place], [place] [address]    Culture   Final    NO GROWTH 5 DAYS Performed at [facility], [address] [place], [place] [address]    Report Status 06/25/2019 FINAL  Final  Culture, blood (Routine X 2) w Reflex to ID Panel     Status: None   Collection Time: 06/20/19 10:30 AM   Specimen: BLOOD  Result Value Ref Range Status   Specimen Description   Final    BLOOD LEFT ANTECUBITAL Performed at [facility], [address]   [place], [place] [address]    Special Requests   Final    BOTTLES DRAWN AEROBIC ONLY Blood Culture adequate volume Performed at [facility], [address] [place], [place] [address]    Culture   Final    NO GROWTH 5 DAYS Performed at [facility], [address] [place], [place] [address]    Report Status 06/25/2019 FINAL  Final         Radiology Studies: Ir Fluoro Guide Cv Line Right  Result Date: 06/24/2019 INDICATION: End-stage renal disease. In need of durable intravenous access for the initiation of hemodialysis. EXAM: TUNNELED CENTRAL VENOUS HEMODIALYSIS CATHETER PLACEMENT WITH ULTRASOUND AND FLUOROSCOPIC GUIDANCE MEDICATIONS: Ancef 2 gm IV . The antibiotic was given in an appropriate time interval prior to skin puncture. ANESTHESIA/SEDATION: Versed 1 mg IV; Fentanyl 50 mcg IV; Moderate Sedation Time:  13 minutes The patient was continuously monitored during the procedure by the interventional radiology nurse under my direct supervision. FLUOROSCOPY TIME:  42 seconds (60 mGy) COMPLICATIONS: None immediate. PROCEDURE:  Informed written consent was obtained from the patient after a discussion of the risks, benefits, and alternatives to treatment. Questions regarding the procedure were encouraged and answered. The right neck and chest were prepped with chlorhexidine in a sterile fashion, and a sterile drape was applied covering the operative field. Maximum barrier sterile technique with sterile gowns and gloves were used for the procedure. A timeout was performed prior to the initiation of the procedure. After creating a small venotomy incision, a micropuncture kit was utilized to access the internal jugular vein. Real-time ultrasound guidance was utilized for vascular access including the acquisition of a permanent ultrasound image documenting patency of the accessed vessel. The microwire was utilized to measure appropriate catheter length. A stiff Glidewire was advanced to the level of the IVC and the micropuncture sheath was exchanged for a peel-away sheath. A palindrome tunneled hemodialysis catheter measuring 19 cm from tip to cuff was tunneled in a retrograde fashion from the anterior chest wall to the venotomy incision. The catheter was then placed through the peel-away sheath with tips ultimately positioned within the superior aspect of the right atrium. Final catheter positioning was confirmed and documented with a spot radiographic image. The catheter aspirates and flushes normally. The catheter was flushed with appropriate volume heparin dwells. The catheter exit site was secured with a 0-Prolene retention suture. The venotomy incision was closed with Dermabond and Steri-strips. Dressings were applied. The patient tolerated the procedure well without immediate post procedural complication. IMPRESSION: Successful placement of 19 cm tip to cuff tunneled hemodialysis catheter via the right internal jugular vein with tips terminating within the superior aspect of the right atrium. The catheter is ready for immediate use.  Electronically Signed   By: John  Watts M.D.   On: 06/24/2019 16:01   Ir Us Guide Vasc Access Right  Result Date: 06/24/2019 INDICATION: End-stage renal disease. In need of durable intravenous access for the initiation of hemodialysis. EXAM: TUNNELED CENTRAL VENOUS HEMODIALYSIS CATHETER PLACEMENT WITH ULTRASOUND AND FLUOROSCOPIC GUIDANCE MEDICATIONS: Ancef 2 gm IV . The antibiotic was given in an appropriate time interval prior to skin puncture. ANESTHESIA/SEDATION: Versed 1 mg IV; Fentanyl 50 mcg IV; Moderate Sedation Time:  13 minutes The patient was continuously monitored during the procedure by the interventional radiology nurse under my direct supervision. FLUOROSCOPY TIME:  42 seconds (60 mGy) COMPLICATIONS: None immediate. PROCEDURE: Informed written consent was obtained from the patient after a discussion of the risks,   benefits, and alternatives to treatment. Questions regarding the procedure were encouraged and answered. The right neck and chest were prepped with chlorhexidine in a sterile fashion, and a sterile drape was applied covering the operative field. Maximum barrier sterile technique with sterile gowns and gloves were used for the procedure. A timeout was performed prior to the initiation of the procedure. After creating a small venotomy incision, a micropuncture kit was utilized to access the internal jugular vein. Real-time ultrasound guidance was utilized for vascular access including the acquisition of a permanent ultrasound image documenting patency of the accessed vessel. The microwire was utilized to measure appropriate catheter length. A stiff Glidewire was advanced to the level of the IVC and the micropuncture sheath was exchanged for a peel-away sheath. A palindrome tunneled hemodialysis catheter measuring 19 cm from tip to cuff was tunneled in a retrograde fashion from the anterior chest wall to the venotomy incision. The catheter was then placed through the peel-away sheath with  tips ultimately positioned within the superior aspect of the right atrium. Final catheter positioning was confirmed and documented with a spot radiographic image. The catheter aspirates and flushes normally. The catheter was flushed with appropriate volume heparin dwells. The catheter exit site was secured with a 0-Prolene retention suture. The venotomy incision was closed with Dermabond and Steri-strips. Dressings were applied. The patient tolerated the procedure well without immediate post procedural complication. IMPRESSION: Successful placement of 19 cm tip to cuff tunneled hemodialysis catheter via the right internal jugular vein with tips terminating within the superior aspect of the right atrium. The catheter is ready for immediate use. Electronically Signed   By: Sandi Mariscal M.D.   On: 06/24/2019 16:01   Vas [place] Upper Extremity Arterial Duplex  Result Date: 06/25/2019 UPPER EXTREMITY DUPLEX STUDY Indications: Pre AVF vs AVG right arm.  Comparison Study: no prior Performing Technologist: June Leap RDMS, RVT  Examination Guidelines: A complete evaluation includes B-mode imaging, spectral Doppler, color Doppler, and power Doppler as needed of all accessible portions of each vessel. Bilateral testing is considered an integral part of a complete examination. Limited examinations for reoccurring indications may be performed as noted.  Right Doppler Findings: +----------+----------+---------+------+-------------------+ Site      PSV (cm/s)Waveform PlaqueComments            +----------+----------+---------+------+-------------------+ Subclavian104       triphasic                          +----------+----------+---------+------+-------------------+ Axillary  111       triphasic                          +----------+----------+---------+------+-------------------+ Brachial  153       triphasic      heterogenous plaque +----------+----------+---------+------+-------------------+ Radial     96        triphasic                          +----------+----------+---------+------+-------------------+ Ulnar     78        triphasic      heterogenous plaque +----------+----------+---------+------+-------------------+  Summary:  Right: No obstruction visualized in the right upper extremity. *See table(s) above for measurements and observations.    Preliminary    Vas [place] Upper Ext Vein Mapping (pre-op Avf)  Result Date: 06/25/2019 UPPER EXTREMITY VEIN MAPPING  Indications: Pre AVF vs AVG right arm. Performing Technologist: June Leap  RDMS, RVT  Examination Guidelines: A complete evaluation includes B-mode imaging, spectral Doppler, color Doppler, and power Doppler as needed of all accessible portions of each vessel. Bilateral testing is considered an integral part of a complete examination. Limited examinations for reoccurring indications may be performed as noted. +-----------------+-------------+----------+---------+ Right Cephalic   Diameter (cm)Depth (cm)Findings  +-----------------+-------------+----------+---------+ Shoulder             0.21        0.82             +-----------------+-------------+----------+---------+ Mid upper arm        0.22        0.59   branching +-----------------+-------------+----------+---------+ Dist upper arm       0.29        0.54             +-----------------+-------------+----------+---------+ Antecubital fossa    0.53                         +-----------------+-------------+----------+---------+ Prox forearm         0.30        0.92   branching +-----------------+-------------+----------+---------+ Mid forearm          0.21        0.67             +-----------------+-------------+----------+---------+ Dist forearm         0.28        0.54             +-----------------+-------------+----------+---------+ +-----------------+-------------+----------+---------+ Right Basilic    Diameter (cm)Depth (cm)Findings   +-----------------+-------------+----------+---------+ Mid upper arm        0.48        1.92             +-----------------+-------------+----------+---------+ Dist upper arm       0.45        1.40             +-----------------+-------------+----------+---------+ Antecubital fossa    0.51                         +-----------------+-------------+----------+---------+ Prox forearm         0.47               branching +-----------------+-------------+----------+---------+ *See table(s) above for measurements and observations.  Diagnosing physician:    Preliminary         Scheduled Meds: . acetaminophen      . Chlorhexidine Gluconate Cloth  6 each Topical Q0600  . Chlorhexidine Gluconate Cloth  6 each Topical Q0600  . Chlorhexidine Gluconate Cloth  6 each Topical Q0600  . docusate sodium  100 mg Oral BID  . feeding supplement (ENSURE ENLIVE)  237 mL Oral Q24H  . feeding supplement (PRO-STAT SUGAR FREE 64)  30 mL Oral Daily  . ferrous sulfate  325 mg Oral Q breakfast  . gabapentin  100 mg Oral TID  . heparin  5,000 Units Subcutaneous Q8H  . levothyroxine  75 mcg Oral QAC breakfast  . loratadine  10 mg Oral Daily  . multivitamin with minerals  1 tablet Oral Daily  . pantoprazole  40 mg Oral Daily  . polyethylene glycol  17 g Oral BID  . senna  1 tablet Oral QHS  . simvastatin  40 mg Oral QHS   Continuous Infusions: . sodium chloride       LOS: 15 days  Alma Friendly, MD [facility]  If 7PM-7AM, please contact night-coverage [email or web address]  06/25/2019, 4:14 PM

## 2019-06-25 NOTE — Progress Notes (Addendum)
Inpatient Rehabilitation-Admissions Coordinator   Pt's insurance has issued a denial for CIR. AC relayed denial to pt who verbalized understanding. AC will notify her family and alert SW/CM need for new dispo plans.   Please call if questions.   [name], OTR/L  Rehab Admissions Coordinator  [phone] 06/25/2019 11:27 AM  Addendum 12:00: Pt's family would like for her to still be considered for CIR. [facility] discussed peer to peer was not sought after due to pt not being medically ready for CIR at this time (noted plans for graft Tuesday and dialysis tolerance being monitored). Spoke with [facility] Medicare CM regarding possibility of opening new case once medically ready. AC will continue to follow progress with therapies while workup ongoing. Will reassess candidacy next week and open new insurance case if pt still needs CIR level therapies.   [name], OTR/L  Rehab Admissions Coordinator  [phone] 06/25/2019 12:05 PM

## 2019-06-25 NOTE — Plan of Care (Signed)
  Problem: Elimination: Goal: Will not experience complications related to bowel motility Outcome: Completed/Met Goal: Will not experience complications related to urinary retention Outcome: Completed/Met   Problem: Safety: Goal: Ability to remain free from injury will improve Outcome: Completed/Met   Problem: Clinical Measurements: Goal: Dialysis access will remain free of complications Outcome: Completed/Met

## 2019-06-25 NOTE — Progress Notes (Signed)
Attempt x 2 for vascular UE  Duplex.   June Leap, BS, RDMS, RVT

## 2019-06-25 NOTE — Progress Notes (Signed)
Physical Therapy Treatment Patient Details Name: Erika Fry MRN: [id number] DOB: 03-08-1946 Today's Date: 06/25/2019    History of Present Illness 73 y.o. female with medical history significant of hypertension, hyperlipidemia, diet-controlled diabetes, GERD, hypothyroidism, gout, pacemaker placement, iron deficiency anemia, CKD-4, CHF, peripheral neuropathy, and recent R nephrostomy drain placed 04/2019 who presents with bilateral leg edema.    PT Comments    Patient ambulating short distances with decreased strength and balance, assist for steadying at times, use of RW which is not her baseline. Overall motivated to regain her independence. Cont to rec CIR.    Follow Up Recommendations  CIR     Equipment Recommendations  None recommended by PT    Recommendations for Other Services       Precautions / Restrictions Precautions Precautions: Fall Precaution Comments: large fluid filled blister on bil LEs, LEs very tender to touch Restrictions Weight Bearing Restrictions: No    Mobility  Bed Mobility Overal bed mobility: Needs Assistance Bed Mobility: Supine to Sit;Sit to Supine     Supine to sit: Min guard;HOB elevated Sit to supine: Min guard   General bed mobility comments: increased time and effort with LEs, pt self assisted  Transfers Overall transfer level: Needs assistance Equipment used: Rolling walker (2 wheeled) Transfers: Sit to/from Stand Sit to Stand: Min assist         General transfer comment: assist for steadying with rise, cues for hand placement.  Ambulation/Gait Ambulation/Gait assistance: Min assist Gait Distance (Feet): 75 Feet Assistive device: Rolling walker (2 wheeled) Gait Pattern/deviations: Step-through pattern;Decreased stride length;Trunk flexed Gait velocity: decreased   General Gait Details: patient weak and imbalanced, assist at times to steady with RW.   Stairs             Wheelchair Mobility    Modified  Rankin (Stroke Patients Only)       Balance Overall balance assessment: Needs assistance         Standing balance support: Bilateral upper extremity supported Standing balance-Leahy Scale: Poor Standing balance comment: requires UE Support                            Cognition Arousal/Alertness: Awake/alert Behavior During Therapy: WFL for tasks assessed/performed Overall Cognitive Status: Within Functional Limits for tasks assessed                                        Exercises      General Comments        Pertinent Vitals/Pain Faces Pain Scale: Hurts even more Pain Location: abdomen> BLE Pain Descriptors / Indicators: Sore;Tender    Home Living                      Prior Function            PT Goals (current goals can now be found in the care plan section) Acute Rehab PT Goals Patient Stated Goal: less pain PT Goal Formulation: With patient Time For Goal Achievement: 06/24/19 Potential to Achieve Goals: Good Progress towards PT goals: Progressing toward goals    Frequency    Min 3X/week      PT Plan Current plan remains appropriate    Co-evaluation              AM-PAC PT "6 Clicks" Mobility   Outcome Measure  Help needed turning from your back to your side while in a flat bed without using bedrails?: A Little Help needed moving from lying on your back to sitting on the side of a flat bed without using bedrails?: A Little Help needed moving to and from a bed to a chair (including a wheelchair)?: A Little Help needed standing up from a chair using your arms (e.g., wheelchair or bedside chair)?: A Little Help needed to walk in hospital room?: A Little Help needed climbing 3-5 steps with a railing? : A Lot 6 Click Score: 17    End of Session Equipment Utilized During Treatment: Gait belt Activity Tolerance: Patient tolerated treatment well Patient left: in bed;with call bell/phone within reach;with bed  alarm set Nurse Communication: Mobility status PT Visit Diagnosis: Muscle weakness (generalized) (M62.81);Difficulty in walking, not elsewhere classified (R26.2) Pain - part of body: Leg     Time: 1420-1436 PT Time Calculation (min) (ACUTE ONLY): 16 min  Charges:  $Gait Training: 8-22 mins                     Reinaldo Berber, PT, DPT Acute Rehabilitation Services Pager: [phone] Office: [phone]     Reinaldo Berber 06/25/2019, 3:47 PM

## 2019-06-25 NOTE — Progress Notes (Signed)
Renal Navigator received notification from Nephrologist/Dr. Jonnie Finner to initiate referral for OP HD treatment for patient. Renal Navigator met with patient at bedside to complete assessment. She informed Renal Navigator that she "really wants to do dialysis at home." Renal Navigator explored this with patient and provided education. She states that she has spoken briefly with her MD, Dr. Johnney Ou, in the office, but has not mentioned this to Dr. Jonnie Finner. She asked if she could have the treatment "like I just had upstairs," (HD) at home and "will a nurse come out to my home?" Renal Navigator explained Home HD vs Home PD and that she and her family would be trained in both. Renal Navigator informed her that she will need to get established on HD in center regardless of her future plans and she stated understanding.  Renal Navigator informed patient that her closest clinic is the [facility]. Patient states she already knows where the [facility] is. Renal Navigator notes that patient's home is equidistant between [place] and [place] [facility] and patient states she needs to speak with her son and daughter-in-law before making a decision on where to go for treatment. Renal Navigator asked that she speak with them today and stated that the referral needs to be submitted today to ensure that it does not delay her hospitalization. She agreed. Information obtained to make referral later today. Renal Navigator informed Dr. Jonnie Finner and Dr. Johnney Ou of patient's desire to explore option of Home Therapy in the future.  Erika Fry, Erika Fry Renal Navigator [phone]

## 2019-06-25 NOTE — Plan of Care (Signed)
  Problem: Nutrition: Goal: Adequate nutrition will be maintained Outcome: Progressing   Problem: Skin Integrity: Goal: Risk for impaired skin integrity will decrease Outcome: Progressing   Problem: Education: Goal: Knowledge of disease and its progression will improve Outcome: Progressing   Problem: Health Behavior/Discharge Planning: Goal: Ability to manage health-related needs will improve Outcome: Progressing   Problem: Clinical Measurements: Goal: Complications related to the disease process or treatment will be avoided or minimized Outcome: Progressing   Problem: Activity: Goal: Activity intolerance will improve Outcome: Progressing

## 2019-06-25 NOTE — Progress Notes (Signed)
UE vein mapping and arterial duplex       have been completed. Preliminary results can be found under CV proc through chart review. June Leap, BS, RDMS, RVT

## 2019-06-25 NOTE — Progress Notes (Signed)
OT Cancellation Note  Patient Details Name: Erika Fry MRN: [id number] DOB: 02-22-1946   Cancelled Treatment:    Reason Eval/Treat Not Completed: Patient at procedure or test/ unavailable (HD).  Darrol Jump OTR/L 06/25/2019, 9:19 AM

## 2019-06-25 NOTE — Progress Notes (Signed)
Renal Navigator met with patient again who states she wants to be referred to [facility] clinic for OP HD treatment. Renal Navigator submitted referral to [facility]missions. HepB surface antigen lab is pending and Renal Navigator will submit lab when it results.  Renal Navigator will follow up with Nephrologist and patient regarding acceptance and seat time.  Alphonzo Cruise, [place] Renal Navigator [phone]

## 2019-06-25 NOTE — Plan of Care (Signed)
  Problem: Activity: Goal: Risk for activity intolerance will decrease Outcome: Progressing   Problem: Pain Managment: Goal: General experience of comfort will improve Outcome: Progressing   Problem: Education: Goal: Knowledge of disease and its progression will improve Outcome: Progressing

## 2019-06-25 NOTE — Progress Notes (Signed)
Subjective:  On HD this am, sp new TDC by IR yest.  Pt w/o complaints today.   Objective Vital signs in last 24 hours: Vitals:   06/25/19 0930 06/25/19 1000 06/25/19 1005 06/25/19 1039  BP: 121/61 116/73 124/66 131/72  Pulse: 66 67 62 63  Resp: 13 17 18 16   Temp:  98.3 F (36.8 C)  98.3 F (36.8 C)  TempSrc:  Oral  Oral  SpO2:  99%  99%  Weight:  75.2 kg    Height:       Weight change:   Intake/Output Summary (Last 24 hours) at 06/25/2019 1135 Last data filed at 06/25/2019 1120 Gross per 24 hour  Intake 1210.03 ml  Output 2475 ml  Net -1264.97 ml    Physical Exam: General: deconditioned but NAD- pleasant and Ox 3 Heart: RRR Lungs: are clear  Abdomen: soft, non tender Extremities: wrinkling skin bilat feet w/o edema , pretib min edema, no thigh edema   Assessment/ Plan: Pt is a 73 y.o. yo female with late stage CKD and lots of difficulty w/ overdiuresis then vol overload and back and forth. Decision made to go forward with initiation of dialysis.   Assessment/Plan:  Advanced CKD: new start to dialysis, new [facility] 7/23 and 1st HD today, 2nd HD tomorrow. Will have perm access placed per [facility] early next week.  CLIP process started. CIR watching.   HTN/vol- BP's good, looks euvolemic on exam  Hyperkalemia: renal diet, HD  Interstitial cystitis: w/ frequent urination  R perc nephrostomy: due to UPJ obstruction, PCN placed May 2020 and replaced June 2020, f/b IR.   Anemia- stable in the 9's   Deconditioning- chronic issue. CIR is following.   Kelly Splinter, MD 06/25/2019, 11:35 AM   Labs: Basic Metabolic Panel: Recent Labs  Lab 06/19/19 0605 06/20/19 0535 06/21/19 1515 06/23/19 0455 06/25/19 0619  NA 132* 131* 133* 136 135  K 4.7 4.8 5.1 5.2* 4.6  CL 98 97* 98 102 102  CO2 21* 18* 15* 22 20*  GLUCOSE 111* 96 135* 115* 99  BUN 95* 99* 111* 111* 87*  CREATININE 5.42* 5.75* 5.93* 5.56* 4.41*  CALCIUM 9.8 9.8 9.7 10.0 10.2  PHOS 6.3* 6.0*  --  5.1*  --     Liver Function Tests: Recent Labs  Lab 06/19/19 0605 06/20/19 0535 06/23/19 0455  ALBUMIN 2.7* 2.6* 2.8*   No results for input(s): LIPASE, AMYLASE in the last 168 hours. No results for input(s): AMMONIA in the last 168 hours. CBC: Recent Labs  Lab 06/20/19 1032  06/21/19 1515 06/22/19 1523 06/24/19 0625 06/25/19 0619  WBC 6.9  --  5.9 6.3 6.1 6.8  NEUTROABS  --    < > 3.9 4.1 3.9 4.4  HGB 8.2*  --  8.8* 8.6* 8.6* 8.6*  HCT 25.8*  --  28.9* 27.8* 27.7* 28.0*  MCV 93.8  --  94.8 93.9 94.2 94.3  PLT 262  --  335 348 408* 429*   < > = values in this interval not displayed.   Cardiac Enzymes: No results for input(s): CKTOTAL, CKMB, CKMBINDEX, TROPONINI in the last 168 hours. CBG: Recent Labs  Lab 06/24/19 0700 06/24/19 1127 06/24/19 1706 06/24/19 2057 06/25/19 0652  GLUCAP 105* 89 95 127* 98    Iron Studies: No results for input(s): IRON, TIBC, TRANSFERRIN, FERRITIN in the last 72 hours. Studies/Results: Ir Fluoro Guide Cv Line Right  Result Date: 06/24/2019 INDICATION: End-stage renal disease. In need of durable intravenous access for the  initiation of hemodialysis. EXAM: TUNNELED CENTRAL VENOUS HEMODIALYSIS CATHETER PLACEMENT WITH ULTRASOUND AND FLUOROSCOPIC GUIDANCE MEDICATIONS: Ancef 2 gm IV . The antibiotic was given in an appropriate time interval prior to skin puncture. ANESTHESIA/SEDATION: Versed 1 mg IV; Fentanyl 50 mcg IV; Moderate Sedation Time:  13 minutes The patient was continuously monitored during the procedure by the interventional radiology nurse under my direct supervision. FLUOROSCOPY TIME:  42 seconds (60 mGy) COMPLICATIONS: None immediate. PROCEDURE: Informed written consent was obtained from the patient after a discussion of the risks, benefits, and alternatives to treatment. Questions regarding the procedure were encouraged and answered. The right neck and chest were prepped with chlorhexidine in a sterile fashion, and a sterile drape was applied  covering the operative field. Maximum barrier sterile technique with sterile gowns and gloves were used for the procedure. A timeout was performed prior to the initiation of the procedure. After creating a small venotomy incision, a micropuncture kit was utilized to access the internal jugular vein. Real-time ultrasound guidance was utilized for vascular access including the acquisition of a permanent ultrasound image documenting patency of the accessed vessel. The microwire was utilized to measure appropriate catheter length. A stiff Glidewire was advanced to the level of the IVC and the micropuncture sheath was exchanged for a peel-away sheath. A palindrome tunneled hemodialysis catheter measuring 19 cm from tip to cuff was tunneled in a retrograde fashion from the anterior chest wall to the venotomy incision. The catheter was then placed through the peel-away sheath with tips ultimately positioned within the superior aspect of the right atrium. Final catheter positioning was confirmed and documented with a spot radiographic image. The catheter aspirates and flushes normally. The catheter was flushed with appropriate volume heparin dwells. The catheter exit site was secured with a 0-Prolene retention suture. The venotomy incision was closed with Dermabond and Steri-strips. Dressings were applied. The patient tolerated the procedure well without immediate post procedural complication. IMPRESSION: Successful placement of 19 cm tip to cuff tunneled hemodialysis catheter via the right internal jugular vein with tips terminating within the superior aspect of the right atrium. The catheter is ready for immediate use. Electronically Signed   By: Sandi Mariscal M.D.   On: 06/24/2019 16:01   Ir US Guide Vasc Access Right  Result Date: 06/24/2019 INDICATION: End-stage renal disease. In need of durable intravenous access for the initiation of hemodialysis. EXAM: TUNNELED CENTRAL VENOUS HEMODIALYSIS CATHETER PLACEMENT WITH  ULTRASOUND AND FLUOROSCOPIC GUIDANCE MEDICATIONS: Ancef 2 gm IV . The antibiotic was given in an appropriate time interval prior to skin puncture. ANESTHESIA/SEDATION: Versed 1 mg IV; Fentanyl 50 mcg IV; Moderate Sedation Time:  13 minutes The patient was continuously monitored during the procedure by the interventional radiology nurse under my direct supervision. FLUOROSCOPY TIME:  42 seconds (60 mGy) COMPLICATIONS: None immediate. PROCEDURE: Informed written consent was obtained from the patient after a discussion of the risks, benefits, and alternatives to treatment. Questions regarding the procedure were encouraged and answered. The right neck and chest were prepped with chlorhexidine in a sterile fashion, and a sterile drape was applied covering the operative field. Maximum barrier sterile technique with sterile gowns and gloves were used for the procedure. A timeout was performed prior to the initiation of the procedure. After creating a small venotomy incision, a micropuncture kit was utilized to access the internal jugular vein. Real-time ultrasound guidance was utilized for vascular access including the acquisition of a permanent ultrasound image documenting patency of the accessed vessel. The microwire was  utilized to measure appropriate catheter length. A stiff Glidewire was advanced to the level of the IVC and the micropuncture sheath was exchanged for a peel-away sheath. A palindrome tunneled hemodialysis catheter measuring 19 cm from tip to cuff was tunneled in a retrograde fashion from the anterior chest wall to the venotomy incision. The catheter was then placed through the peel-away sheath with tips ultimately positioned within the superior aspect of the right atrium. Final catheter positioning was confirmed and documented with a spot radiographic image. The catheter aspirates and flushes normally. The catheter was flushed with appropriate volume heparin dwells. The catheter exit site was secured  with a 0-Prolene retention suture. The venotomy incision was closed with Dermabond and Steri-strips. Dressings were applied. The patient tolerated the procedure well without immediate post procedural complication. IMPRESSION: Successful placement of 19 cm tip to cuff tunneled hemodialysis catheter via the right internal jugular vein with tips terminating within the superior aspect of the right atrium. The catheter is ready for immediate use. Electronically Signed   By: Sandi Mariscal M.D.   On: 06/24/2019 16:01   Medications: Infusions: . sodium chloride      Scheduled Medications: . acetaminophen      . Chlorhexidine Gluconate Cloth  6 each Topical Q0600  . Chlorhexidine Gluconate Cloth  6 each Topical Q0600  . docusate sodium  100 mg Oral BID  . feeding supplement (ENSURE ENLIVE)  237 mL Oral Q24H  . feeding supplement (PRO-STAT SUGAR FREE 64)  30 mL Oral Daily  . ferrous sulfate  325 mg Oral Q breakfast  . gabapentin  100 mg Oral TID  . heparin  5,000 Units Subcutaneous Q8H  . levothyroxine  75 mcg Oral QAC breakfast  . loratadine  10 mg Oral Daily  . multivitamin with minerals  1 tablet Oral Daily  . pantoprazole  40 mg Oral Daily  . polyethylene glycol  17 g Oral BID  . senna  1 tablet Oral QHS  . simvastatin  40 mg Oral QHS    have reviewed scheduled and prn medications.   06/25/2019,11:35 AM  LOS: 15 days

## 2019-06-25 NOTE — Progress Notes (Signed)
Scheduled for right arm AVF vs graft on Tuesday (given chronic pacemaker on left) with Dr. Donzetta Matters.  Vein mapping and upper extremity arterial duplex ordered for today.   Marty Heck, MD [facility] Office: [phone] Pager: [place]

## 2019-06-25 NOTE — Progress Notes (Signed)
Vascular UE duplex attempted- pt just went to dialysis.   Will check back.  June Leap, BS, RDMS, RVT

## 2019-06-26 LAB — GLUCOSE, CAPILLARY
Glucose-Capillary: 95 mg/dL (ref 70–99)
Glucose-Capillary: 144 mg/dL — ABNORMAL HIGH (ref 70–99)
Glucose-Capillary: 109 mg/dL — ABNORMAL HIGH (ref 70–99)

## 2019-06-26 LAB — CBC WITH DIFFERENTIAL/PLATELET
Abs Immature Granulocytes: 0.19 10*3/uL — ABNORMAL HIGH (ref 0.00–0.07)
Basophils Absolute: 0.1 10*3/uL (ref 0.0–0.1)
Basophils Relative: 1 %
Eosinophils Absolute: 0.2 10*3/uL (ref 0.0–0.5)
Eosinophils Relative: 2 %
HCT: 28.2 % — ABNORMAL LOW (ref 36.0–46.0)
Hemoglobin: 9 g/dL — ABNORMAL LOW (ref 12.0–15.0)
Immature Granulocytes: 2 %
Lymphocytes Relative: 13 %
Lymphs Abs: 1.2 10*3/uL (ref 0.7–4.0)
MCH: 29.4 pg (ref 26.0–34.0)
MCHC: 31.9 g/dL (ref 30.0–36.0)
MCV: 92.2 fL (ref 80.0–100.0)
Monocytes Absolute: 0.7 10*3/uL (ref 0.1–1.0)
Monocytes Relative: 7 %
Neutro Abs: 7 10*3/uL (ref 1.7–7.7)
Neutrophils Relative %: 75 %
Platelets: 267 10*3/uL (ref 150–400)
RBC: 3.06 MIL/uL — ABNORMAL LOW (ref 3.87–5.11)
RDW: 13.9 % (ref 11.5–15.5)
WBC: 9.3 10*3/uL (ref 4.0–10.5)
nRBC: 0 % (ref 0.0–0.2)

## 2019-06-26 LAB — HEPATITIS B SURFACE ANTIGEN: Hepatitis B Surface Ag: NEGATIVE

## 2019-06-26 MED ORDER — OXYCODONE HCL 5 MG PO TABS
ORAL_TABLET | ORAL | Status: AC
  Filled 2019-06-26: qty 1

## 2019-06-26 NOTE — Progress Notes (Signed)
Subjective:  Seen in room, feeling "good", no new issues or problems on HD yest  Objective Vital signs in last 24 hours: Vitals:   06/26/19 1630 06/26/19 1650 06/26/19 1654 06/26/19 1949  BP: 117/65 140/65 131/72 133/66  Pulse: 74 72 74 72  Resp: 16 18 16 16   Temp:  98.2 F (36.8 C)  98.6 F (37 C)  TempSrc:  Oral  Oral  SpO2:  99%  96%  Weight:  76 kg    Height:       Weight change: 2.171 kg  Intake/Output Summary (Last 24 hours) at 06/26/2019 2358 Last data filed at 06/26/2019 2200 Gross per 24 hour  Intake 1380 ml  Output 1750 ml  Net -370 ml    Physical Exam: General: deconditioned but NAD- pleasant and Ox 3, new R IJ TDC Heart: RRR Lungs: are clear  Abdomen: soft, non tender Extremities: wrinkling skin bilat feet w/o edema , pretib 1-2+ edema Neuro: alert, looks more alert and improved vitality today  Summary: Pt is a 73 y.o. yo female with late stage CKD and lots of difficulty w/ overdiuresis then vol overload and back and forth. Decision made to go forward with initiation of dialysis.   Assessment/Plan:  CKD V: now is new start to dialysis, TDC placed 7/23, had 1st HD yest and will have 2nd HD today. Will have perm access placed per VVS early next week.  CLIP process started  HTN/vol- BP's good, has some LE edema   Hyperkalemia: renal diet, HD  Interstitial cystitis: w/ frequent urination, lasix dc'd now that on dialysis  R perc nephrostomy: due to UPJ obstruction, PCN placed May 2020 and replaced June 2020, f/b IR.   Anemia- stable in the 9's   Deconditioning- chronic issue. CIR is following.   Kelly Splinter, MD 06/26/2019, 11:58 AM   Labs: Basic Metabolic Panel: Recent Labs  Lab 06/20/19 0535  06/23/19 0455 06/25/19 0619 06/26/19 2138  NA 131*   < > 136 135 135  K 4.8   < > 5.2* 4.6 4.0  CL 97*   < > 102 102 99  CO2 18*   < > 22 20* 25  GLUCOSE 96   < > 115* 99 187*  BUN 99*   < > 111* 87* 27*  CREATININE 5.75*   < > 5.56* 4.41* 2.28*   CALCIUM 9.8   < > 10.0 10.2 9.4  PHOS 6.0*  --  5.1*  --   --    < > = values in this interval not displayed.   Liver Function Tests: Recent Labs  Lab 06/20/19 0535 06/23/19 0455  ALBUMIN 2.6* 2.8*   No results for input(s): LIPASE, AMYLASE in the last 168 hours. No results for input(s): AMMONIA in the last 168 hours. CBC: Recent Labs  Lab 06/21/19 1515 06/22/19 1523 06/24/19 0625 06/25/19 0619 06/26/19 2138  WBC 5.9 6.3 6.1 6.8 9.3  NEUTROABS 3.9 4.1 3.9 4.4 7.0  HGB 8.8* 8.6* 8.6* 8.6* 9.0*  HCT 28.9* 27.8* 27.7* 28.0* 28.2*  MCV 94.8 93.9 94.2 94.3 92.2  PLT 335 348 408* 429* 267    Medications: Infusions: . sodium chloride      Scheduled Medications: . Chlorhexidine Gluconate Cloth  6 each Topical Q0600  . docusate sodium  100 mg Oral BID  . feeding supplement (ENSURE ENLIVE)  237 mL Oral Q24H  . feeding supplement (PRO-STAT SUGAR FREE 64)  30 mL Oral Daily  . ferrous sulfate  325 mg Oral  Q breakfast  . gabapentin  100 mg Oral TID  . heparin  5,000 Units Subcutaneous Q8H  . levothyroxine  75 mcg Oral QAC breakfast  . loratadine  10 mg Oral Daily  . multivitamin with minerals  1 tablet Oral Daily  . pantoprazole  40 mg Oral Daily  . polyethylene glycol  17 g Oral BID  . senna  1 tablet Oral QHS  . simvastatin  40 mg Oral QHS    have reviewed scheduled and prn medications.   06/26/2019,11:58 PM  LOS: 16 days

## 2019-06-26 NOTE — Progress Notes (Signed)
[name]  PROGRESS NOTE    ARYANNAH MOHON  WTK:[id number] DOB: 02-02-1946 DOA: 06/09/2019 PCP: [name], MD   Brief Narrative:   73 y.o.femalewith medical history significant ofhypertension, hyperlipidemia, diet-controlled diabetes, GERD, hypothyroidism, gout, pacemaker placement, iron deficiency anemia, CKD-4, CHF, who presents with bilateral leg edema.   Assessment & Plan:   Principal Problem:   Bilateral leg edema Active Problems:   Chronic kidney disease (CKD), stage IV (severe) (HCC)   Diet-controlled diabetes mellitus (HCC)   GERD (gastroesophageal reflux disease)   High cholesterol   Hypothyroidism   Iron deficiency anemia   Essential hypertension   UTI (urinary tract infection)   Weakness generalized   Chronic diastolic CHF (congestive heart failure) (HCC)   Cellulitis of lower extremity   Acute on chronic diastolic CHF (congestive heart failure) (HCC)   Hypomagnesemia   DNR (do not resuscitate) discussion   Cystitis   Pain in both lower extremities   CKD (chronic kidney disease) stage V requiring chronic dialysis (HCC)  Acute on chronic diastolic HF Recurrent bilateral leg edema BNP 138.5 Echo with EF of 55 to 60%, with left ventricular diastolic Doppler parameters consistent with impaired relaxation Patient has been appropriately diuresed with IV Lasix and torsemide, currently on hold due to worsening renal function Strict I's and O's, daily weights Monitor closely  AKI on STAGE 4 CKD; now ESRD Worsening creatinine, started HD on 06/25/19, tolerating it Diuretics include Lasix, torsemide held Nephrology on board  Pyuria/UTI UA with large leukocytes, greater than 50 WBC, Urine cultures show multiple bacterial morphotypes Blood cultures are negative.  Short course of keflex was ordered; to be completed on 7/24    Cellulitis of bilateral lower extremities  Currently afebrile, with no leukocytosis Completed the course of  antibiotics  Hyponatremia Resolved Likely due to volume overload Daily BMP    Type 2 diabetes mellitus Diet controlled  Hypertension Stable  Anemia of chronic kidney disease Hemoglobin at baseline Daily CBC  Hypothyroidism Continue Synthroid   GERD Continue PPI       DVT prophylaxis: heparin Code Status: FULL   Disposition Plan: TBD   Consultants:   Nephrology  Palliative care  Antimicrobials:  . Keflex    Subjective: Denies any new complaints  Objective: Vitals:   06/26/19 1350 06/26/19 1400 06/26/19 1430 06/26/19 1500  BP: (!) 148/77 (!) 150/74 (!) 141/69 113/60  Pulse: 74 70 76 75  Resp: _0 Temp:      TempSrc:      SpO2:      Weight:      Height:        Intake/Output Summary (Last 24 hours) at 06/26/2019 1507 Last data filed at 06/26/2019 1259 Gross per 24 hour  Intake 1197 ml  Output 951 ml  Net 246 ml   Filed Weights   06/25/19 1000 06/25/19 2045 06/26/19 1345  Weight: 75.2 kg 74.9 kg 76.4 kg    Examination:  General: NAD   Cardiovascular: S1, S2 present  Respiratory: CTAB  Abdomen: Soft, nontender, nondistended, bowel sounds present  Musculoskeletal: ++bilateral pedal edema noted, in ace wrap, TTP b/l feet  Skin: Normal  Psychiatry: Normal mood  Data Reviewed: I have personally reviewed following labs and imaging studies.  CBC: Recent Labs  Lab 06/20/19 1032 06/21/19 1515 06/22/19 1523 06/24/19 0625 06/25/19 0619  WBC 6.9 5.9 6.3 6.1 6.8  NEUTROABS  --  3.9 4.1 3.9 4.4  HGB 8.2* 8.8* 8.6* 8.6* 8.6*  HCT 25.8* 28.9*  27.8* 27.7* 28.0*  MCV 93.8 94.8 93.9 94.2 94.3  PLT 262 335 348 408* 235*   Basic Metabolic Panel: Recent Labs  Lab 06/20/19 0535 06/21/19 1515 06/23/19 0455 06/25/19 0619  NA 131* 133* 136 135  K 4.8 5.1 5.2* 4.6  CL 97* 98 102 102  CO2 18* 15* 22 20*  GLUCOSE 96 135* 115* 99  BUN 99* 111* 111* 87*  CREATININE 5.75* 5.93* 5.56* 4.41*  CALCIUM 9.8 9.7 10.0 10.2  PHOS  6.0*  --  5.1*  --    GFR: Estimated Creatinine Clearance: 11.3 mL/min (A) (by C-G formula based on SCr of 4.41 mg/dL (H)). Liver Function Tests: Recent Labs  Lab 06/20/19 0535 06/23/19 0455  ALBUMIN 2.6* 2.8*   No results for input(s): LIPASE, AMYLASE in the last 168 hours. No results for input(s): AMMONIA in the last 168 hours. Coagulation Profile: Recent Labs  Lab 06/24/19 0625  INR 1.0   Cardiac Enzymes: No results for input(s): CKTOTAL, CKMB, CKMBINDEX, TROPONINI in the last 168 hours. BNP (last 3 results) No results for input(s): PROBNP in the last 8760 hours. HbA1C: No results for input(s): HGBA1C in the last 72 hours. CBG: Recent Labs  Lab 06/25/19 1132 06/25/19 1634 06/25/19 2048 06/26/19 0700 06/26/19 1118  GLUCAP 101* 156* 102* 95 144*   Lipid Profile: No results for input(s): CHOL, HDL, LDLCALC, TRIG, CHOLHDL, LDLDIRECT in the last 72 hours. Thyroid Function Tests: No results for input(s): TSH, T4TOTAL, FREET4, T3FREE, THYROIDAB in the last 72 hours. Anemia Panel: No results for input(s): VITAMINB12, FOLATE, FERRITIN, TIBC, IRON, RETICCTPCT in the last 72 hours. Sepsis Labs: No results for input(s): PROCALCITON, LATICACIDVEN in the last 168 hours.  Recent Results (from the past 240 hour(s))  Culture, Urine     Status: Abnormal   Collection Time: 06/20/19  9:14 AM   Specimen: Urine, Random  Result Value Ref Range Status   Specimen Description   Final    URINE, RANDOM Performed at [facility] [address] [place], [place] [address]    Special Requests   Final    NONE Performed at [facility], [place] [address], [place], [place] [address]    Culture MULTIPLE SPECIES PRESENT, SUGGEST RECOLLECTION (A)  Final   Report Status 06/21/2019 FINAL  Final  Culture, blood (Routine X 2) w Reflex to ID Panel     Status: None   Collection Time: 06/20/19 10:30 AM   Specimen: BLOOD  Result Value Ref Range Status    Specimen Description   Final    BLOOD RIGHT ANTECUBITAL Performed at [facility], [place] [address], [place], [place] [address]    Special Requests   Final    BOTTLES DRAWN AEROBIC ONLY Blood Culture adequate volume Performed at [facility] [address] [place], [place] [address]    Culture   Final    NO GROWTH 5 DAYS Performed at [facility], [place] [address] [place], [place] [address]    Report Status 06/25/2019 FINAL  Final  Culture, blood (Routine X 2) w Reflex to ID Panel     Status: None   Collection Time: 06/20/19 10:30 AM   Specimen: BLOOD  Result Value Ref Range Status   Specimen Description   Final    BLOOD LEFT ANTECUBITAL Performed at [facility], [place] [address], [place], [place] [address]    Special Requests   Final    BOTTLES DRAWN AEROBIC  ONLY Blood Culture adequate volume Performed at [facility] [address] [place], [place] [address]    Culture   Final    NO GROWTH 5 DAYS Performed at [facility], [place] [address], [place], [place] [address]    Report Status 06/25/2019 FINAL  Final         Radiology Studies: Ir Fluoro Guide Cv Line Right  Result Date: 06/24/2019 INDICATION: End-stage renal disease. In need of durable intravenous access for the initiation of hemodialysis. EXAM: TUNNELED CENTRAL VENOUS HEMODIALYSIS CATHETER PLACEMENT WITH ULTRASOUND AND FLUOROSCOPIC GUIDANCE MEDICATIONS: Ancef 2 gm IV . The antibiotic was given in an appropriate time interval prior to skin puncture. ANESTHESIA/SEDATION: Versed 1 mg IV; Fentanyl 50 mcg IV; Moderate Sedation Time:  13 minutes The patient was continuously monitored during the procedure by the interventional radiology nurse under my direct supervision. FLUOROSCOPY TIME:  42 seconds (60 mGy) COMPLICATIONS: None immediate. PROCEDURE: Informed written consent was obtained from the patient after a discussion of the risks, benefits, and alternatives to  treatment. Questions regarding the procedure were encouraged and answered. The right neck and chest were prepped with chlorhexidine in a sterile fashion, and a sterile drape was applied covering the operative field. Maximum barrier sterile technique with sterile gowns and gloves were used for the procedure. A timeout was performed prior to the initiation of the procedure. After creating a small venotomy incision, a micropuncture kit was utilized to access the internal jugular vein. Real-time ultrasound guidance was utilized for vascular access including the acquisition of a permanent ultrasound image documenting patency of the accessed vessel. The microwire was utilized to measure appropriate catheter length. A stiff Glidewire was advanced to the level of the IVC and the micropuncture sheath was exchanged for a peel-away sheath. A palindrome tunneled hemodialysis catheter measuring 19 cm from tip to cuff was tunneled in a retrograde fashion from the anterior chest wall to the venotomy incision. The catheter was then placed through the peel-away sheath with tips ultimately positioned within the superior aspect of the right atrium. Final catheter positioning was confirmed and documented with a spot radiographic image. The catheter aspirates and flushes normally. The catheter was flushed with appropriate volume heparin dwells. The catheter exit site was secured with a 0-Prolene retention suture. The venotomy incision was closed with Dermabond and Steri-strips. Dressings were applied. The patient tolerated the procedure well without immediate post procedural complication. IMPRESSION: Successful placement of 19 cm tip to cuff tunneled hemodialysis catheter via the right internal jugular vein with tips terminating within the superior aspect of the right atrium. The catheter is ready for immediate use. Electronically Signed   By: Sandi Mariscal M.D.   On: 06/24/2019 16:01   Ir US Guide Vasc Access Right  Result Date:  06/24/2019 INDICATION: End-stage renal disease. In need of durable intravenous access for the initiation of hemodialysis. EXAM: TUNNELED CENTRAL VENOUS HEMODIALYSIS CATHETER PLACEMENT WITH ULTRASOUND AND FLUOROSCOPIC GUIDANCE MEDICATIONS: Ancef 2 gm IV . The antibiotic was given in an appropriate time interval prior to skin puncture. ANESTHESIA/SEDATION: Versed 1 mg IV; Fentanyl 50 mcg IV; Moderate Sedation Time:  13 minutes The patient was continuously monitored during the procedure by the interventional radiology nurse under my direct supervision. FLUOROSCOPY TIME:  42 seconds (60 mGy) COMPLICATIONS: None immediate. PROCEDURE: Informed written consent was obtained from the patient after a discussion of the risks, benefits, and alternatives to treatment. Questions regarding the procedure were encouraged and answered. The right neck and  chest were prepped with chlorhexidine in a sterile fashion, and a sterile drape was applied covering the operative field. Maximum barrier sterile technique with sterile gowns and gloves were used for the procedure. A timeout was performed prior to the initiation of the procedure. After creating a small venotomy incision, a micropuncture kit was utilized to access the internal jugular vein. Real-time ultrasound guidance was utilized for vascular access including the acquisition of a permanent ultrasound image documenting patency of the accessed vessel. The microwire was utilized to measure appropriate catheter length. A stiff Glidewire was advanced to the level of the IVC and the micropuncture sheath was exchanged for a peel-away sheath. A palindrome tunneled hemodialysis catheter measuring 19 cm from tip to cuff was tunneled in a retrograde fashion from the anterior chest wall to the venotomy incision. The catheter was then placed through the peel-away sheath with tips ultimately positioned within the superior aspect of the right atrium. Final catheter positioning was confirmed and  documented with a spot radiographic image. The catheter aspirates and flushes normally. The catheter was flushed with appropriate volume heparin dwells. The catheter exit site was secured with a 0-Prolene retention suture. The venotomy incision was closed with Dermabond and Steri-strips. Dressings were applied. The patient tolerated the procedure well without immediate post procedural complication. IMPRESSION: Successful placement of 19 cm tip to cuff tunneled hemodialysis catheter via the right internal jugular vein with tips terminating within the superior aspect of the right atrium. The catheter is ready for immediate use. Electronically Signed   By: Sandi Mariscal M.D.   On: 06/24/2019 16:01   Vas [place] Upper Extremity Arterial Duplex  Result Date: 06/25/2019 UPPER EXTREMITY DUPLEX STUDY Indications: Pre AVF vs AVG right arm.  Comparison Study: no prior Performing Technologist: June Leap RDMS, RVT  Examination Guidelines: A complete evaluation includes B-mode imaging, spectral Doppler, color Doppler, and power Doppler as needed of all accessible portions of each vessel. Bilateral testing is considered an integral part of a complete examination. Limited examinations for reoccurring indications may be performed as noted.  Right Doppler Findings: +----------+----------+---------+------+-------------------+ Site      PSV (cm/s)Waveform PlaqueComments            +----------+----------+---------+------+-------------------+ Subclavian104       triphasic                          +----------+----------+---------+------+-------------------+ Axillary  111       triphasic                          +----------+----------+---------+------+-------------------+ Brachial  153       triphasic      heterogenous plaque +----------+----------+---------+------+-------------------+ Radial    96        triphasic                          +----------+----------+---------+------+-------------------+ Ulnar      78        triphasic      heterogenous plaque +----------+----------+---------+------+-------------------+  Summary:  Right: No obstruction visualized in the right upper extremity. *See table(s) above for measurements and observations. Electronically signed by Deitra Mayo MD on 06/25/2019 at 6:00:39 PM.    Final    Vas [place] Upper Ext Vein Mapping (pre-op Avf)  Result Date: 06/25/2019 UPPER EXTREMITY VEIN MAPPING  Indications: Pre AVF vs AVG right arm. Performing Technologist: June Leap RDMS, RVT  Examination Guidelines: A  complete evaluation includes B-mode imaging, spectral Doppler, color Doppler, and power Doppler as needed of all accessible portions of each vessel. Bilateral testing is considered an integral part of a complete examination. Limited examinations for reoccurring indications may be performed as noted. +-----------------+-------------+----------+---------+ Right Cephalic   Diameter (cm)Depth (cm)Findings  +-----------------+-------------+----------+---------+ Shoulder             0.21        0.82             +-----------------+-------------+----------+---------+ Mid upper arm        0.22        0.59   branching +-----------------+-------------+----------+---------+ Dist upper arm       0.29        0.54             +-----------------+-------------+----------+---------+ Antecubital fossa    0.53                         +-----------------+-------------+----------+---------+ Prox forearm         0.30        0.92   branching +-----------------+-------------+----------+---------+ Mid forearm          0.21        0.67             +-----------------+-------------+----------+---------+ Dist forearm         0.28        0.54             +-----------------+-------------+----------+---------+ +-----------------+-------------+----------+---------+ Right Basilic    Diameter (cm)Depth (cm)Findings  +-----------------+-------------+----------+---------+ Mid  upper arm        0.48        1.92             +-----------------+-------------+----------+---------+ Dist upper arm       0.45        1.40             +-----------------+-------------+----------+---------+ Antecubital fossa    0.51                         +-----------------+-------------+----------+---------+ Prox forearm         0.47               branching +-----------------+-------------+----------+---------+ *See table(s) above for measurements and observations.  Diagnosing physician: Deitra Mayo MD Electronically signed by Deitra Mayo MD on 06/25/2019 at 6:00:47 PM.    Final         Scheduled Meds: . Chlorhexidine Gluconate Cloth  6 each Topical Q0600  . docusate sodium  100 mg Oral BID  . feeding supplement (ENSURE ENLIVE)  237 mL Oral Q24H  . feeding supplement (PRO-STAT SUGAR FREE 64)  30 mL Oral Daily  . ferrous sulfate  325 mg Oral Q breakfast  . gabapentin  100 mg Oral TID  . heparin  5,000 Units Subcutaneous Q8H  . levothyroxine  75 mcg Oral QAC breakfast  . loratadine  10 mg Oral Daily  . multivitamin with minerals  1 tablet Oral Daily  . oxyCODONE      . pantoprazole  40 mg Oral Daily  . polyethylene glycol  17 g Oral BID  . senna  1 tablet Oral QHS  . simvastatin  40 mg Oral QHS   Continuous Infusions: . sodium chloride       LOS: 16 days         Alma Friendly, MD [facility]  If 7PM-7AM, please  contact night-coverage [email or web address]  06/26/2019, 3:07 PM

## 2019-06-26 NOTE — Plan of Care (Signed)
  Problem: Education: Goal: Knowledge of disease and its progression will improve Outcome: Completed/Met   Problem: Health Behavior/Discharge Planning: Goal: Ability to manage health-related needs will improve Outcome: Completed/Met   Problem: Respiratory: Goal: Respiratory symptoms related to disease process will be avoided Outcome: Completed/Met

## 2019-06-27 LAB — CBC WITH DIFFERENTIAL/PLATELET
Abs Immature Granulocytes: 0.3 10*3/uL — ABNORMAL HIGH (ref 0.00–0.07)
Basophils Absolute: 0.1 10*3/uL (ref 0.0–0.1)
Basophils Relative: 1 %
Eosinophils Absolute: 0.2 10*3/uL (ref 0.0–0.5)
Eosinophils Relative: 3 %
HCT: 28.4 % — ABNORMAL LOW (ref 36.0–46.0)
Hemoglobin: 9 g/dL — ABNORMAL LOW (ref 12.0–15.0)
Immature Granulocytes: 4 %
Lymphocytes Relative: 19 %
Lymphs Abs: 1.6 10*3/uL (ref 0.7–4.0)
MCH: 29 pg (ref 26.0–34.0)
MCHC: 31.7 g/dL (ref 30.0–36.0)
MCV: 91.6 fL (ref 80.0–100.0)
Monocytes Absolute: 0.8 10*3/uL (ref 0.1–1.0)
Monocytes Relative: 9 %
Neutro Abs: 5.6 10*3/uL (ref 1.7–7.7)
Neutrophils Relative %: 64 %
Platelets: 268 10*3/uL (ref 150–400)
RBC: 3.1 MIL/uL — ABNORMAL LOW (ref 3.87–5.11)
RDW: 13.8 % (ref 11.5–15.5)
WBC: 8.6 10*3/uL (ref 4.0–10.5)
nRBC: 0 % (ref 0.0–0.2)

## 2019-06-27 LAB — BASIC METABOLIC PANEL WITH GFR
Anion gap: 11 (ref 5–15)
BUN: 27 mg/dL — ABNORMAL HIGH (ref 8–23)
CO2: 25 mmol/L (ref 22–32)
Calcium: 9.4 mg/dL (ref 8.9–10.3)
Chloride: 99 mmol/L (ref 98–111)
Creatinine, Ser: 2.28 mg/dL — ABNORMAL HIGH (ref 0.44–1.00)
GFR calc Af Amer: 24 mL/min — ABNORMAL LOW (ref >60)
GFR calc non Af Amer: 20 mL/min — ABNORMAL LOW (ref >60)
Glucose, Bld: 187 mg/dL — ABNORMAL HIGH (ref 70–99)
Potassium: 4 mmol/L (ref 3.5–5.1)
Sodium: 135 mmol/L (ref 135–145)

## 2019-06-27 MED ORDER — CHLORHEXIDINE GLUCONATE CLOTH 2 % EX PADS
6.0000 | MEDICATED_PAD | Freq: Every day | CUTANEOUS | Status: DC
  Administered 2019-06-29 – 2019-06-30 (×2): 6 via TOPICAL

## 2019-06-27 NOTE — Progress Notes (Signed)
[name] [facility]  PROGRESS NOTE    Erika Fry  ZOX:[id number] DOB: [birth year] DOA: 06/09/2019 PCP: [name], MD   Brief Narrative:   73 y.o.femalewith medical history significant ofhypertension, hyperlipidemia, diet-controlled diabetes, GERD, hypothyroidism, gout, pacemaker placement, iron deficiency anemia, CKD-4, CHF, who presents with bilateral leg edema.   Assessment & Plan:   Principal Problem:   Bilateral leg edema Active Problems:   Chronic kidney disease (CKD), stage IV (severe) (HCC)   Diet-controlled diabetes mellitus (HCC)   GERD (gastroesophageal reflux disease)   High cholesterol   Hypothyroidism   Iron deficiency anemia   Essential hypertension   UTI (urinary tract infection)   Weakness generalized   Chronic diastolic CHF (congestive heart failure) (HCC)   Cellulitis of lower extremity   Acute on chronic diastolic CHF (congestive heart failure) (HCC)   Hypomagnesemia   DNR (do not resuscitate) discussion   Cystitis   Pain in both lower extremities   CKD (chronic kidney disease) stage V requiring chronic dialysis ([place])  Acute on chronic diastolic HF BNP 811.9 Echo with EF of 55 to 60%, with left ventricular diastolic Doppler parameters consistent with impaired relaxation Patient has been appropriately diuresed with IV Lasix and torsemide, currently on hold due to worsening renal function Strict I's and O's, daily weights Monitor closely  AKI on STAGE 4 CKD; now ESRD Worsening creatinine, started HD on 06/25/19, tolerating it Diuretics include Lasix, torsemide held Nephrology on board  Pyuria/UTI UA with large leukocytes, greater than 50 WBC, Urine cultures show multiple bacterial morphotypes Blood cultures are negative.  Short course of keflex was ordered; to be completed on 7/24    Cellulitis of bilateral lower extremities  Currently afebrile, with no leukocytosis Completed the course of antibiotics  Hyponatremia Resolved Likely due to volume  overload Daily BMP    Type 2 diabetes mellitus Diet controlled  Hypertension Stable  Anemia of chronic kidney disease Hemoglobin at baseline Daily CBC  Hypothyroidism Continue Synthroid   GERD Continue PPI       DVT prophylaxis: heparin Code Status: FULL   Disposition Plan: TBD   Consultants:   Nephrology  Palliative care  Antimicrobials:  . Keflex    Subjective: Denies any new complaints  Objective: Vitals:   06/26/19 1650 06/26/19 1654 06/26/19 1949 06/27/19 0344  BP: 140/65 131/72 133/66 (!) 94/59  Pulse: 72 74 72 72  Resp: 18 16 16 16   Temp: 98.2 F (36.8 C)  98.6 F (37 C) 98.3 F (36.8 C)  TempSrc: Oral  Oral Oral  SpO2: 99%  96% 98%  Weight: 76 kg   72.6 kg  Height:        Intake/Output Summary (Last 24 hours) at 06/27/2019 1640 Last data filed at 06/27/2019 1437 Gross per 24 hour  Intake 2160 ml  Output 3100 ml  Net -940 ml   Filed Weights   06/26/19 1345 06/26/19 1650 06/27/19 0344  Weight: 76.4 kg 76 kg 72.6 kg    Examination:  General: NAD   Cardiovascular: S1, S2 present  Respiratory: CTAB  Abdomen: Soft, nontender, nondistended, bowel sounds present  Musculoskeletal: +bilateral pedal edema noted in ace wrap  Skin: Normal  Psychiatry: Normal mood   Data Reviewed: I have personally reviewed following labs and imaging studies.  CBC: Recent Labs  Lab 06/22/19 1523 06/24/19 0625 06/25/19 0619 06/26/19 2138 06/27/19 0543  WBC 6.3 6.1 6.8 9.3 8.6  NEUTROABS 4.1 3.9 4.4 7.0 5.6  HGB 8.6* 8.6* 8.6* 9.0* 9.0*  HCT 27.8*  27.7* 28.0* 28.2* 28.4*  MCV 93.9 94.2 94.3 92.2 91.6  PLT 348 408* 429* 267 416   Basic Metabolic Panel: Recent Labs  Lab 06/21/19 1515 06/23/19 0455 06/25/19 0619 06/26/19 2138  NA 133* 136 135 135  K 5.1 5.2* 4.6 4.0  CL 98 102 102 99  CO2 15* 22 20* 25  GLUCOSE 135* 115* 99 187*  BUN 111* 111* 87* 27*  CREATININE 5.93* 5.56* 4.41* 2.28*  CALCIUM 9.7 10.0 10.2 9.4  PHOS  --   5.1*  --   --    GFR: Estimated Creatinine Clearance: 21.3 mL/min (A) (by C-G formula based on SCr of 2.28 mg/dL (H)). Liver Function Tests: Recent Labs  Lab 06/23/19 0455  ALBUMIN 2.8*   No results for input(s): LIPASE, AMYLASE in the last 168 hours. No results for input(s): AMMONIA in the last 168 hours. Coagulation Profile: Recent Labs  Lab 06/24/19 0625  INR 1.0   Cardiac Enzymes: No results for input(s): CKTOTAL, CKMB, CKMBINDEX, TROPONINI in the last 168 hours. BNP (last 3 results) No results for input(s): PROBNP in the last 8760 hours. HbA1C: No results for input(s): HGBA1C in the last 72 hours. CBG: Recent Labs  Lab 06/25/19 1634 06/25/19 2048 06/26/19 0700 06/26/19 1118 06/26/19 1724  GLUCAP 156* 102* 95 144* 109*   Lipid Profile: No results for input(s): CHOL, HDL, LDLCALC, TRIG, CHOLHDL, LDLDIRECT in the last 72 hours. Thyroid Function Tests: No results for input(s): TSH, T4TOTAL, FREET4, T3FREE, THYROIDAB in the last 72 hours. Anemia Panel: No results for input(s): VITAMINB12, FOLATE, FERRITIN, TIBC, IRON, RETICCTPCT in the last 72 hours. Sepsis Labs: No results for input(s): PROCALCITON, LATICACIDVEN in the last 168 hours.  Recent Results (from the past 240 hour(s))  Culture, Urine     Status: Abnormal   Collection Time: 06/20/19  9:14 AM   Specimen: Urine, Random  Result Value Ref Range Status   Specimen Description   Final    URINE, RANDOM Performed at [facility] [address], [place], [place] [address]    Special Requests   Final    NONE Performed at [facility], [place] [address] [place], [place] [address]    Culture MULTIPLE SPECIES PRESENT, SUGGEST RECOLLECTION (A)  Final   Report Status 06/21/2019 FINAL  Final  Culture, blood (Routine X 2) w Reflex to ID Panel     Status: None   Collection Time: 06/20/19 10:30 AM   Specimen: BLOOD  Result Value Ref Range Status   Specimen Description   Final     BLOOD RIGHT ANTECUBITAL Performed at [facility], [place] [address] [place], [place] [address]    Special Requests   Final    BOTTLES DRAWN AEROBIC ONLY Blood Culture adequate volume Performed at [facility] [address] [place], [place] [address]    Culture   Final    NO GROWTH 5 DAYS Performed at [facility], [place] [address] [place], [place] [address]    Report Status 06/25/2019 FINAL  Final  Culture, blood (Routine X 2) w Reflex to ID Panel     Status: None   Collection Time: 06/20/19 10:30 AM   Specimen: BLOOD  Result Value Ref Range Status   Specimen Description   Final    BLOOD LEFT ANTECUBITAL Performed at [facility], [place] [address] [place], [place] [address]    Special Requests   Final    BOTTLES DRAWN AEROBIC  ONLY Blood Culture adequate volume Performed at [facility] [address] [place], [place] [address]    Culture   Final    NO GROWTH 5 DAYS Performed at [facility], [place] [address] [place], [place] [address]    Report Status 06/25/2019 FINAL  Final         Radiology Studies: No results found.      Scheduled Meds: . Chlorhexidine Gluconate Cloth  6 each Topical Q0600  . [START ON 06/28/2019] Chlorhexidine Gluconate Cloth  6 each Topical Q0600  . docusate sodium  100 mg Oral BID  . feeding supplement (ENSURE ENLIVE)  237 mL Oral Q24H  . feeding supplement (PRO-STAT SUGAR FREE 64)  30 mL Oral Daily  . ferrous sulfate  325 mg Oral Q breakfast  . gabapentin  100 mg Oral TID  . heparin  5,000 Units Subcutaneous Q8H  . levothyroxine  75 mcg Oral QAC breakfast  . loratadine  10 mg Oral Daily  . multivitamin with minerals  1 tablet Oral Daily  . pantoprazole  40 mg Oral Daily  . polyethylene glycol  17 g Oral BID  . senna  1 tablet Oral QHS  . simvastatin  40 mg Oral QHS   Continuous Infusions: . sodium chloride       LOS: 17 days         Alma Friendly,  MD [facility]  If 7PM-7AM, please contact night-coverage [email or web address]  06/27/2019, 4:40 PM

## 2019-06-27 NOTE — Progress Notes (Signed)
Patient stated that she has been in contact with her family, so it was not necessary to update them at this time. I informed her that she and her family are welcome to ask anytime for updates. Patient was very appreciative of this information.

## 2019-06-27 NOTE — Progress Notes (Signed)
Subjective:  Seen in room, no new c/o  Objective Vital signs in last 24 hours: Vitals:   06/26/19 1650 06/26/19 1654 06/26/19 1949 06/27/19 0344  BP: 140/65 131/72 133/66 (!) 94/59  Pulse: 72 74 72 72  Resp: 18 16 16 16   Temp: 98.2 F (36.8 C)  98.6 F (37 C) 98.3 F (36.8 C)  TempSrc: Oral  Oral Oral  SpO2: 99%  96% 98%  Weight: 76 kg   72.6 kg  Height:       Weight change: 1.2 kg  Intake/Output Summary (Last 24 hours) at 06/27/2019 1217 Last data filed at 06/27/2019 0930 Gross per 24 hour  Intake 1500 ml  Output 2200 ml  Net -700 ml    Physical Exam: General: deconditioned but NAD- pleasant and Ox 3, new R IJ TDC Heart: RRR Lungs: are clear  Abdomen: soft, non tender Extremities: wrinkling skin bilat feet w/o edema , pretib 1-2+ edema Neuro: alert, looks more alert and improved vitality today  Summary: Pt is a 73 y.o. yo female with late stage CKD and lots of difficulty w/ overdiuresis then vol overload and back and forth. Decision made to go forward with initiation of dialysis.   Assessment/Plan:  CKD V: new start to dialysis, TDC placed 7/23, had 1st HD Fri and 2nd on Sat.  Will have perm access placed per VVS prob Tuesday. CLIP process started. HD tomorrow.   HTN/vol- BP's soft, mild edema on exam  Hyperkalemia: renal diet, HD  Interstitial cystitis: w/ frequent urination, lasix dc'd now that on dialysis  R perc nephrostomy: due to UPJ obstruction, PCN placed May 2020 and replaced June 2020, f/b IR.   Anemia- stable in the 9's   Deconditioning- chronic issue. CIR is following.   Kelly Splinter, MD 06/27/2019, 11:58 AM   Labs: Basic Metabolic Panel: Recent Labs  Lab 06/23/19 0455 06/25/19 0619 06/26/19 2138  NA 136 135 135  K 5.2* 4.6 4.0  CL 102 102 99  CO2 22 20* 25  GLUCOSE 115* 99 187*  BUN 111* 87* 27*  CREATININE 5.56* 4.41* 2.28*  CALCIUM 10.0 10.2 9.4  PHOS 5.1*  --   --    Liver Function Tests: Recent Labs  Lab 06/23/19 0455   ALBUMIN 2.8*   No results for input(s): LIPASE, AMYLASE in the last 168 hours. No results for input(s): AMMONIA in the last 168 hours. CBC: Recent Labs  Lab 06/22/19 1523 06/24/19 0625 06/25/19 0619 06/26/19 2138 06/27/19 0543  WBC 6.3 6.1 6.8 9.3 8.6  NEUTROABS 4.1 3.9 4.4 7.0 5.6  HGB 8.6* 8.6* 8.6* 9.0* 9.0*  HCT 27.8* 27.7* 28.0* 28.2* 28.4*  MCV 93.9 94.2 94.3 92.2 91.6  PLT 348 408* 429* 267 268    Medications: Infusions: . sodium chloride      Scheduled Medications: . Chlorhexidine Gluconate Cloth  6 each Topical Q0600  . docusate sodium  100 mg Oral BID  . feeding supplement (ENSURE ENLIVE)  237 mL Oral Q24H  . feeding supplement (PRO-STAT SUGAR FREE 64)  30 mL Oral Daily  . ferrous sulfate  325 mg Oral Q breakfast  . gabapentin  100 mg Oral TID  . heparin  5,000 Units Subcutaneous Q8H  . levothyroxine  75 mcg Oral QAC breakfast  . loratadine  10 mg Oral Daily  . multivitamin with minerals  1 tablet Oral Daily  . pantoprazole  40 mg Oral Daily  . polyethylene glycol  17 g Oral BID  . senna  1 tablet Oral QHS  . simvastatin  40 mg Oral QHS    have reviewed scheduled and prn medications.   06/27/2019,12:17 PM  LOS: 17 days

## 2019-06-28 LAB — HEPATITIS B SURFACE ANTIBODY,QUALITATIVE: Hep B S Ab: NONREACTIVE

## 2019-06-28 LAB — CBC WITH DIFFERENTIAL/PLATELET
Abs Immature Granulocytes: 0.38 10*3/uL — ABNORMAL HIGH (ref 0.00–0.07)
Basophils Absolute: 0.1 10*3/uL (ref 0.0–0.1)
Basophils Relative: 1 %
Eosinophils Absolute: 0.3 10*3/uL (ref 0.0–0.5)
Eosinophils Relative: 3 %
HCT: 28.5 % — ABNORMAL LOW (ref 36.0–46.0)
Hemoglobin: 9.5 g/dL — ABNORMAL LOW (ref 12.0–15.0)
Immature Granulocytes: 3 %
Lymphocytes Relative: 15 %
Lymphs Abs: 1.7 10*3/uL (ref 0.7–4.0)
MCH: 30 pg (ref 26.0–34.0)
MCHC: 33.3 g/dL (ref 30.0–36.0)
MCV: 89.9 fL (ref 80.0–100.0)
Monocytes Absolute: 0.9 10*3/uL (ref 0.1–1.0)
Monocytes Relative: 8 %
Neutro Abs: 7.9 10*3/uL — ABNORMAL HIGH (ref 1.7–7.7)
Neutrophils Relative %: 70 %
Platelets: 287 10*3/uL (ref 150–400)
RBC: 3.17 MIL/uL — ABNORMAL LOW (ref 3.87–5.11)
RDW: 13.7 % (ref 11.5–15.5)
WBC: 11.2 10*3/uL — ABNORMAL HIGH (ref 4.0–10.5)
nRBC: 0.2 % (ref 0.0–0.2)

## 2019-06-28 LAB — HEPATITIS B CORE ANTIBODY, TOTAL: Hep B Core Total Ab: NEGATIVE

## 2019-06-28 MED ORDER — HEPARIN SODIUM (PORCINE) 1000 UNIT/ML DIALYSIS: 20.0000 [IU]/kg | INTRAMUSCULAR | Status: DC | PRN

## 2019-06-28 MED ORDER — OXYCODONE HCL 5 MG PO TABS
5.0000 mg | ORAL_TABLET | Freq: Once | ORAL | Status: AC
  Administered 2019-06-28: 5 mg via ORAL

## 2019-06-28 MED ORDER — HEPARIN SODIUM (PORCINE) 1000 UNIT/ML DIALYSIS: 1500.0000 [IU] | INTRAMUSCULAR | Status: DC | PRN

## 2019-06-28 MED ORDER — CEFAZOLIN SODIUM-DEXTROSE 1-4 GM/50ML-% IV SOLN
1.0000 g | INTRAVENOUS | Status: AC
  Administered 2019-06-29: 1 g via INTRAVENOUS
  Filled 2019-06-28: qty 50

## 2019-06-28 MED ORDER — RENA-VITE PO TABS
1.0000 | ORAL_TABLET | Freq: Every day | ORAL | Status: DC
  Administered 2019-06-28 – 2019-06-29 (×2): 1 via ORAL
  Filled 2019-06-28 (×2): qty 1

## 2019-06-28 MED ORDER — NEPRO/CARBSTEADY PO LIQD
237.0000 mL | Freq: Every day | ORAL | Status: DC
  Administered 2019-06-29: 237 mL via ORAL

## 2019-06-28 NOTE — Progress Notes (Signed)
Patient has been accepted at  [facility] for OP HD treatment on a MWF schedule with a seat time of 6:05am. Nephrologist/Dr. Posey Pronto and patient notified.   [facility] [address], [place], [place] [address] [phone]  Patient is cleared from an OP HD standpoint.   Alphonzo Cruise, [place] Renal Navigator [phone]

## 2019-06-28 NOTE — Progress Notes (Signed)
Patient ID: Erika Fry, female   DOB: [birth year], 73 y.o.   MRN: [id number]  [facility] Progress Note   Assessment/ Plan:   1. ESRD: Progressive chronic kidney disease stage V now new start to dialysis.  Status post [facility] on 06/24/2019 for which she has had 2 dialysis treatments so far with a third treatment scheduled for today.  Plans noted by vascular surgery for right BCF versus BBF tomorrow based on intraoperative vascular assessment.  Volume status improving, hyperkalemia corrected. 2. Anemia: Check iron saturations prior to initiating ESA therapy. 3. CKD-MBD: Calcium and phosphorus level within acceptable range, follow-up on PTH.  Not on binder. 4. Nutrition: Continue renal diet with ONS/renal multivitamin. 5. Hypertension: Blood pressures elevated, continue to monitor with hemodialysis/ultrafiltration.  Subjective:   Reports to be feeling fatigued from resting in bed.  Denies any chest pain or shortness of breath.   Objective:   BP (!) 154/73 (BP Location: Left Wrist)   Pulse 75   Temp 98.2 F (36.8 C) (Oral)   Resp 18   Ht 5\' 5"  (1.651 m)   Wt 73.5 kg   SpO2 100%   BMI 26.97 kg/m   Physical Exam: Gen: Appears comfortable resting in bed, watching television CVS: Pulse regular rhythm, normal rate, S1 and S2 normal Resp: Clear to auscultation bilaterally, no rales/rhonchi.  Right IJ TDC Abd: Soft, flat, nontender, right percutaneous nephrostomy tube in situ. Ext: 1-2+ pitting bilateral lower extremity edema.  Legs wrapped.  Labs: BMET Recent Labs  Lab 06/21/19 1515 06/23/19 0455 06/25/19 0619 06/26/19 2138  NA 133* 136 135 135  K 5.1 5.2* 4.6 4.0  CL 98 102 102 99  CO2 15* 22 20* 25  GLUCOSE 135* 115* 99 187*  BUN 111* 111* 87* 27*  CREATININE 5.93* 5.56* 4.41* 2.28*  CALCIUM 9.7 10.0 10.2 9.4  PHOS  --  5.1*  --   --    CBC Recent Labs  Lab 06/25/19 0619 06/26/19 2138 06/27/19 0543 06/28/19 0836  WBC 6.8 9.3 8.6 11.2*  NEUTROABS 4.4  7.0 5.6 7.9*  HGB 8.6* 9.0* 9.0* 9.5*  HCT 28.0* 28.2* 28.4* 28.5*  MCV 94.3 92.2 91.6 89.9  PLT 429* 267 268 287   Medications:    . Chlorhexidine Gluconate Cloth  6 each Topical Q0600  . Chlorhexidine Gluconate Cloth  6 each Topical Q0600  . docusate sodium  100 mg Oral BID  . feeding supplement (ENSURE ENLIVE)  237 mL Oral Q24H  . feeding supplement (PRO-STAT SUGAR FREE 64)  30 mL Oral Daily  . ferrous sulfate  325 mg Oral Q breakfast  . gabapentin  100 mg Oral TID  . heparin  5,000 Units Subcutaneous Q8H  . levothyroxine  75 mcg Oral QAC breakfast  . loratadine  10 mg Oral Daily  . multivitamin with minerals  1 tablet Oral Daily  . pantoprazole  40 mg Oral Daily  . polyethylene glycol  17 g Oral BID  . senna  1 tablet Oral QHS  . simvastatin  40 mg Oral QHS   Elmarie Shiley, MD 06/28/2019, 10:47 AM

## 2019-06-28 NOTE — Progress Notes (Addendum)
Erika Fry  PROGRESS NOTE    Erika Fry  RSW:[id number] DOB: 06/23/1946 DOA: 06/09/2019 PCP: [name], MD   Brief Narrative:   73 y.o.femalewith medical history significant ofhypertension, hyperlipidemia, diet-controlled diabetes, GERD, hypothyroidism, gout, pacemaker placement, iron deficiency anemia, CKD-4, CHF, who presents with bilateral leg edema.   Assessment & Plan:   Principal Problem:   Bilateral leg edema Active Problems:   Chronic kidney disease (CKD), stage IV (severe) (HCC)   Diet-controlled diabetes mellitus (HCC)   GERD (gastroesophageal reflux disease)   High cholesterol   Hypothyroidism   Iron deficiency anemia   Essential hypertension   UTI (urinary tract infection)   Weakness generalized   Chronic diastolic CHF (congestive heart failure) (HCC)   Cellulitis of lower extremity   Acute on chronic diastolic CHF (congestive heart failure) (HCC)   Hypomagnesemia   DNR (do not resuscitate) discussion   Cystitis   Pain in both lower extremities   CKD (chronic kidney disease) stage V requiring chronic dialysis ([place])  Acute on chronic diastolic HF BNP 093.8 Echo with EF of 55 to 60%, with left ventricular diastolic Doppler parameters consistent with impaired relaxation Patient had been appropriately diuresed with IV Lasix and torsemide, currently on hold due to worsening renal function Strict I's and O's, daily weights Monitor closely  AKI on STAGE 4 CKD; now ESRD Worsening creatinine, started HD on 06/25/19, tolerating it Diuretics include Lasix, torsemide held Pt has an outpt HD set up (M/W/F) Vascular on board: Plan for R arm AVF vs graft on 06/29/19 Nephrology on board  Pyuria/UTI UA with large leukocytes, greater than 50 WBC, Urine cultures show multiple bacterial morphotypes Blood cultures are negative.  Short course of keflex was ordered; to be completed on 7/24    Cellulitis of bilateral lower extremities  Currently afebrile, with no  leukocytosis Completed the course of antibiotics  Hyponatremia Resolved Likely due to volume overload Daily BMP    Type 2 diabetes mellitus Diet controlled  Hypertension Stable  Anemia of chronic kidney disease Hemoglobin at baseline Daily CBC  Hypothyroidism Continue Synthroid   GERD Continue PPI       DVT prophylaxis: heparin Code Status: FULL   Disposition Plan: TBD   Consultants:   Nephrology  Palliative care  Antimicrobials:  . Keflex    Subjective: Denies any new complaints  Objective: Vitals:   06/28/19 1300 06/28/19 1315 06/28/19 1330 06/28/19 1345  BP: 122/62 121/65 122/78 138/71  Pulse: 78 78 74 83  Resp: 14 15 16 17   Temp:      TempSrc:      SpO2:      Weight:      Height:        Intake/Output Summary (Last 24 hours) at 06/28/2019 1353 Last data filed at 06/28/2019 0900 Gross per 24 hour  Intake 1260 ml  Output 1700 ml  Net -440 ml   Filed Weights   06/27/19 0344 06/28/19 0345 06/28/19 1235  Weight: 72.6 kg 73.5 kg 74.7 kg    Examination:  General: NAD   Cardiovascular: S1, S2 present  Respiratory: CTAB  Abdomen: Soft, nontender, nondistended, bowel sounds present  Musculoskeletal: ++bilateral pedal edema noted  Skin: Normal  Psychiatry: Normal mood   Data Reviewed: I have personally reviewed following labs and imaging studies.  CBC: Recent Labs  Lab 06/24/19 0625 06/25/19 0619 06/26/19 2138 06/27/19 0543 06/28/19 0836  WBC 6.1 6.8 9.3 8.6 11.2*  NEUTROABS 3.9 4.4 7.0 5.6 7.9*  HGB 8.6* 8.6* 9.0*  9.0* 9.5*  HCT 27.7* 28.0* 28.2* 28.4* 28.5*  MCV 94.2 94.3 92.2 91.6 89.9  PLT 408* 429* 267 268 935   Basic Metabolic Panel: Recent Labs  Lab 06/21/19 1515 06/23/19 0455 06/25/19 0619 06/26/19 2138  NA 133* 136 135 135  K 5.1 5.2* 4.6 4.0  CL 98 102 102 99  CO2 15* 22 20* 25  GLUCOSE 135* 115* 99 187*  BUN 111* 111* 87* 27*  CREATININE 5.93* 5.56* 4.41* 2.28*  CALCIUM 9.7 10.0 10.2 9.4   PHOS  --  5.1*  --   --    GFR: Estimated Creatinine Clearance: 21.6 mL/min (A) (by C-G formula based on SCr of 2.28 mg/dL (H)). Liver Function Tests: Recent Labs  Lab 06/23/19 0455  ALBUMIN 2.8*   No results for input(s): LIPASE, AMYLASE in the last 168 hours. No results for input(s): AMMONIA in the last 168 hours. Coagulation Profile: Recent Labs  Lab 06/24/19 0625  INR 1.0   Cardiac Enzymes: No results for input(s): CKTOTAL, CKMB, CKMBINDEX, TROPONINI in the last 168 hours. BNP (last 3 results) No results for input(s): PROBNP in the last 8760 hours. HbA1C: No results for input(s): HGBA1C in the last 72 hours. CBG: Recent Labs  Lab 06/25/19 1634 06/25/19 2048 06/26/19 0700 06/26/19 1118 06/26/19 1724  GLUCAP 156* 102* 95 144* 109*   Lipid Profile: No results for input(s): CHOL, HDL, LDLCALC, TRIG, CHOLHDL, LDLDIRECT in the last 72 hours. Thyroid Function Tests: No results for input(s): TSH, T4TOTAL, FREET4, T3FREE, THYROIDAB in the last 72 hours. Anemia Panel: No results for input(s): VITAMINB12, FOLATE, FERRITIN, TIBC, IRON, RETICCTPCT in the last 72 hours. Sepsis Labs: No results for input(s): PROCALCITON, LATICACIDVEN in the last 168 hours.  Recent Results (from the past 240 hour(s))  Culture, Urine     Status: Abnormal   Collection Time: 06/20/19  9:14 AM   Specimen: Urine, Random  Result Value Ref Range Status   Specimen Description   Final    URINE, RANDOM Performed at [facility] [address] [place], [place] [address]    Special Requests   Final    NONE Performed at [facility], [place] [address], [place], [place] [address]    Culture MULTIPLE SPECIES PRESENT, SUGGEST RECOLLECTION (A)  Final   Report Status 06/21/2019 FINAL  Final  Culture, blood (Routine X 2) w Reflex to ID Panel     Status: None   Collection Time: 06/20/19 10:30 AM   Specimen: BLOOD  Result Value Ref Range Status   Specimen  Description   Final    BLOOD RIGHT ANTECUBITAL Performed at [facility], [place] [address] [place], [place] [address]    Special Requests   Final    BOTTLES DRAWN AEROBIC ONLY Blood Culture adequate volume Performed at [facility] [address], [place], [place] [address]    Culture   Final    NO GROWTH 5 DAYS Performed at [facility], [place] [address], [place], [place] [address]    Report Status 06/25/2019 FINAL  Final  Culture, blood (Routine X 2) w Reflex to ID Panel     Status: None   Collection Time: 06/20/19 10:30 AM   Specimen: BLOOD  Result Value Ref Range Status   Specimen Description   Final    BLOOD LEFT ANTECUBITAL Performed at [facility], [place] [address] [place],  [address]    Special Requests   Final  BOTTLES DRAWN AEROBIC ONLY Blood Culture adequate volume Performed at [facility] [address] [place], [place] [address]    Culture   Final    NO GROWTH 5 DAYS Performed at [facility], [place] [address] [place], [place] [address]    Report Status 06/25/2019 FINAL  Final         Radiology Studies: No results found.      Scheduled Meds: . Chlorhexidine Gluconate Cloth  6 each Topical Q0600  . Chlorhexidine Gluconate Cloth  6 each Topical Q0600  . docusate sodium  100 mg Oral BID  . feeding supplement (ENSURE ENLIVE)  237 mL Oral Q24H  . feeding supplement (PRO-STAT SUGAR FREE 64)  30 mL Oral Daily  . ferrous sulfate  325 mg Oral Q breakfast  . gabapentin  100 mg Oral TID  . heparin  5,000 Units Subcutaneous Q8H  . levothyroxine  75 mcg Oral QAC breakfast  . loratadine  10 mg Oral Daily  . multivitamin with minerals  1 tablet Oral Daily  . pantoprazole  40 mg Oral Daily  . polyethylene glycol  17 g Oral BID  . senna  1 tablet Oral QHS  . simvastatin  40 mg Oral QHS   Continuous Infusions: . sodium chloride    . [START ON 06/29/2019]  ceFAZolin (ANCEF) IV       LOS:  18 days         Alma Friendly, MD [facility]  If 7PM-7AM, please contact night-coverage [email or web address]  06/28/2019, 1:53 PM

## 2019-06-28 NOTE — Care Management Important Message (Signed)
Important Message  Patient Details  Name: Erika Fry MRN: [id number] Date of Birth: 09/15/1946   Medicare Important Message Given:  Yes     [name] 06/28/2019, 3:01 PM

## 2019-06-28 NOTE — Plan of Care (Signed)
  Problem: Activity: Goal: Risk for activity intolerance will decrease Outcome: Progressing   Problem: Pain Managment: Goal: General experience of comfort will improve Outcome: Progressing   Problem: Clinical Measurements: Goal: Complications related to the disease process or treatment will be avoided or minimized Outcome: Progressing

## 2019-06-28 NOTE — Procedures (Signed)
Patient seen on Hemodialysis. BP 122/76   Pulse 88   Temp (!) 97.2 F (36.2 C) (Axillary)   Resp 16   Ht 5\' 5"  (1.651 m)   Wt 74.7 kg   SpO2 100%   BMI 27.40 kg/m   QB 300, UF goal 1L Tolerating treatment without complaints at this time.   Elmarie Shiley MD [facility]. Office # [phone] Pager # [phone] 2:28 PM

## 2019-06-28 NOTE — Progress Notes (Signed)
[facility]  Subjective  - No complaints. TDC placed by IR working well.   Objective (!) 152/74 77 98.1 F (36.7 C) (Oral) 16 98%  Intake/Output Summary (Last 24 hours) at 06/28/2019 0800 Last data filed at 06/28/2019 0600 Gross per 24 hour  Intake 2160 ml  Output 2300 ml  Net -140 ml    Right radial and brachial pulse palpable RIJ tunneled TDC Left sided chest wall pacemaker  Laboratory Lab Results: Recent Labs    06/26/19 2138 06/27/19 0543  WBC 9.3 8.6  HGB 9.0* 9.0*  HCT 28.2* 28.4*  PLT 267 268   BMET Recent Labs    06/26/19 2138  NA 135  K 4.0  CL 99  CO2 25  GLUCOSE 187*  BUN 27*  CREATININE 2.28*  CALCIUM 9.4    COAG Lab Results  Component Value Date   INR 1.0 06/24/2019   INR 1.0 04/09/2019   INR 1.01 05/14/2018   No results found for: PTT  Assessment/Planning:  Plan for right arm AVF vs graft tomorrow with Dr. Donzetta Matters.  Please have NPO after midnight.  Cephalic looks good until upper arm/shoulder on vein mapping and basilic looks robust - will evaluate in OR tomorrow to make decision.  Discussed risks and benefits with patient.  Please restrict right arm.    Marty Heck 06/28/2019 8:00 AM --

## 2019-06-28 NOTE — Progress Notes (Signed)
Nutrition Follow-up  DOCUMENTATION CODES:   Not applicable  INTERVENTION:   -D/c Ensure Enlive po daily, each supplement provides 350 kcal and 20 grams of protein -D/c MVI with minerals daily -Continue 30 ml Prostat daily, each supplement provides 100 kcals and 15 grams protein -Nepro Shake po daily, each supplement provides 425 kcal and 19 grams protein -Renal MVI daily -Provided "Food Pyramid for Healthy Eating with Kidney Disease" handout in AVS/discharge summary; RD can provide further education as pt approaches discharge  NUTRITION DIAGNOSIS:   Increased nutrient needs related to acute illness as evidenced by estimated needs.  Ongoing  GOAL:   Patient will meet greater than or equal to 90% of their needs  Progressing  MONITOR:   PO intake, Supplement acceptance, Labs, Weight trends, I & O's  REASON FOR ASSESSMENT:   LOS(day #11)    ASSESSMENT:   73 y.o. female with medical history significant of HTN, hyperlipidemia, diet-controlled DM, GERD, hypothyroidism, gout, pacemaker placement, iron deficiency anemia, CKD-4, and CHF. She presented to the ED with bilateral leg edema.  7/23- HD cath placed, HD initiated  Reviewed I/O's: -140 ml x 24 hours and -6.6 L x 24 hours  UOP: 2.3 L x 24 hours  Per vascular surgery notes, plan for AVF graft tomorrow (06/29/19).   Pt out of room at time of visit. Observed lunch tray- pt consumed 100% pf grape juice, 25% of hot chocolate, and 90% of meatloaf and mashed potatoes. Meal completion documented at 100%.   Per CSW notes, outpatient HD seat has been obtained.   Possible plan to d/c to CIR once medically optimized.   Labs reviewed: CBGS: 109-144.   Diet Order:   Diet Order            Diet NPO time specified Except for: Sips with Meds  Diet effective midnight        Diet 2 gram sodium Room service appropriate? Yes; Fluid consistency: Thin  Diet effective now        Diet - low sodium heart healthy               EDUCATION NEEDS:   No education needs have been identified at this time  Skin:  Skin Assessment: Skin Integrity Issues: Skin Integrity Issues:: Other (Comment) Other: serous blisters BLE  Last BM:  06/27/19  Height:   Ht Readings from Last 1 Encounters:  06/10/19 5\' 5"  (1.651 m)    Weight:   Wt Readings from Last 1 Encounters:  06/28/19 74.7 kg    Ideal Body Weight:  56.8 kg  BMI:  Body mass index is 27.4 kg/m.  Estimated Nutritional Needs:   Kcal:  1800-2000  Protein:  95-110 grams  Fluid:  1000 ML + UOP    Erika Fry A. Jimmye Norman, RD, LDN, [place] Registered Dietitian II Certified Diabetes Care and Education Specialist Pager: [phone] After hours Pager: [phone]

## 2019-06-28 NOTE — Progress Notes (Signed)
Pt c/o neck pain at 10/10. It is too soon for oxycodone and Tylenol. Messaged NP on call. Awaiting response.   Eleanora Neighbor, RN

## 2019-06-29 ENCOUNTER — Inpatient Hospital Stay (HOSPITAL_COMMUNITY): Payer: Medicare Other | Admitting: Certified Registered Nurse Anesthetist

## 2019-06-29 ENCOUNTER — Encounter (HOSPITAL_COMMUNITY)
Admission: EM | Disposition: A | Discharge status: Home / Self Care | Payer: Self-pay | Source: Home / Self Care | Attending: Internal Medicine

## 2019-06-29 ENCOUNTER — Encounter (HOSPITAL_COMMUNITY): Payer: Self-pay | Admitting: Certified Registered Nurse Anesthetist

## 2019-06-29 DIAGNOSIS — N2581 Secondary hyperparathyroidism of renal origin: Secondary | ICD-10-CM | POA: Insufficient documentation

## 2019-06-29 DIAGNOSIS — D689 Coagulation defect, unspecified: Secondary | ICD-10-CM | POA: Insufficient documentation

## 2019-06-29 HISTORY — PX: AV FISTULA PLACEMENT: SHX1204

## 2019-06-29 LAB — CBC WITH DIFFERENTIAL/PLATELET
Abs Immature Granulocytes: 0.28 10*3/uL — ABNORMAL HIGH (ref 0.00–0.07)
Basophils Absolute: 0.1 10*3/uL (ref 0.0–0.1)
Basophils Relative: 1 %
Eosinophils Absolute: 0.2 10*3/uL (ref 0.0–0.5)
Eosinophils Relative: 2 %
HCT: 28.8 % — ABNORMAL LOW (ref 36.0–46.0)
Hemoglobin: 9.2 g/dL — ABNORMAL LOW (ref 12.0–15.0)
Immature Granulocytes: 3 %
Lymphocytes Relative: 14 %
Lymphs Abs: 1.6 10*3/uL (ref 0.7–4.0)
MCH: 29.4 pg (ref 26.0–34.0)
MCHC: 31.9 g/dL (ref 30.0–36.0)
MCV: 92 fL (ref 80.0–100.0)
Monocytes Absolute: 0.8 10*3/uL (ref 0.1–1.0)
Monocytes Relative: 7 %
Neutro Abs: 8.4 10*3/uL — ABNORMAL HIGH (ref 1.7–7.7)
Neutrophils Relative %: 73 %
Platelets: 242 10*3/uL (ref 150–400)
RBC: 3.13 MIL/uL — ABNORMAL LOW (ref 3.87–5.11)
RDW: 13.9 % (ref 11.5–15.5)
WBC: 11.4 10*3/uL — ABNORMAL HIGH (ref 4.0–10.5)
nRBC: 0 % (ref 0.0–0.2)

## 2019-06-29 LAB — POCT I-STAT 4, (NA,K, GLUC, HGB,HCT)
Glucose, Bld: 103 mg/dL — ABNORMAL HIGH (ref 70–99)
HCT: 27 % — ABNORMAL LOW (ref 36.0–46.0)
Hemoglobin: 9.2 g/dL — ABNORMAL LOW (ref 12.0–15.0)
Potassium: 4.3 mmol/L (ref 3.5–5.1)
Sodium: 131 mmol/L — ABNORMAL LOW (ref 135–145)

## 2019-06-29 LAB — BASIC METABOLIC PANEL
Anion gap: 10 (ref 5–15)
BUN: 20 mg/dL (ref 8–23)
CO2: 27 mmol/L (ref 22–32)
Calcium: 9.8 mg/dL (ref 8.9–10.3)
Chloride: 97 mmol/L — ABNORMAL LOW (ref 98–111)
Creatinine, Ser: 2.38 mg/dL — ABNORMAL HIGH (ref 0.44–1.00)
GFR calc Af Amer: 22 mL/min — ABNORMAL LOW (ref >60)
GFR calc non Af Amer: 19 mL/min — ABNORMAL LOW (ref >60)
Glucose, Bld: 104 mg/dL — ABNORMAL HIGH (ref 70–99)
Potassium: 4.5 mmol/L (ref 3.5–5.1)
Sodium: 134 mmol/L — ABNORMAL LOW (ref 135–145)

## 2019-06-29 LAB — MRSA PCR SCREENING: MRSA by PCR: NEGATIVE

## 2019-06-29 LAB — PROTIME-INR
INR: 1.1 (ref 0.8–1.2)
Prothrombin Time: 13.6 seconds (ref 11.4–15.2)

## 2019-06-29 SURGERY — ARTERIOVENOUS (AV) FISTULA CREATION
Anesthesia: Monitor Anesthesia Care | Site: Arm Upper | Laterality: Right

## 2019-06-29 MED ORDER — FENTANYL CITRATE (PF) 100 MCG/2ML IJ SOLN: 25.0000 ug | INTRAMUSCULAR | Status: DC | PRN

## 2019-06-29 MED ORDER — PROPOFOL 500 MG/50ML IV EMUL
INTRAVENOUS | Status: DC | PRN
  Administered 2019-06-29: 60 ug/kg/min via INTRAVENOUS

## 2019-06-29 MED ORDER — LIDOCAINE HCL (CARDIAC) PF 100 MG/5ML IV SOSY
PREFILLED_SYRINGE | INTRAVENOUS | Status: DC | PRN
  Administered 2019-06-29: 40 mg via INTRAVENOUS

## 2019-06-29 MED ORDER — PROPOFOL 1000 MG/100ML IV EMUL
INTRAVENOUS | Status: AC
  Filled 2019-06-29: qty 100

## 2019-06-29 MED ORDER — MIDAZOLAM HCL 2 MG/2ML IJ SOLN: 0.5000 mg | Freq: Once | INTRAMUSCULAR | Status: DC | PRN

## 2019-06-29 MED ORDER — MIDAZOLAM HCL 5 MG/5ML IJ SOLN
INTRAMUSCULAR | Status: DC | PRN
  Administered 2019-06-29: 1 mg via INTRAVENOUS

## 2019-06-29 MED ORDER — SODIUM CHLORIDE 0.9 % IV SOLN
INTRAVENOUS | Status: DC | PRN
  Administered 2019-06-29: 09:00:00 500 mL

## 2019-06-29 MED ORDER — PROMETHAZINE HCL 25 MG/ML IJ SOLN: 6.2500 mg | INTRAMUSCULAR | Status: DC | PRN

## 2019-06-29 MED ORDER — 0.9 % SODIUM CHLORIDE (POUR BTL) OPTIME
TOPICAL | Status: DC | PRN
  Administered 2019-06-29: 1000 mL

## 2019-06-29 MED ORDER — PROPOFOL 10 MG/ML IV BOLUS
INTRAVENOUS | Status: DC | PRN
  Administered 2019-06-29: 20 mg via INTRAVENOUS

## 2019-06-29 MED ORDER — ONDANSETRON HCL 4 MG/2ML IJ SOLN
INTRAMUSCULAR | Status: DC | PRN
  Administered 2019-06-29: 4 mg via INTRAVENOUS

## 2019-06-29 MED ORDER — SODIUM CHLORIDE 0.9 % IV SOLN
INTRAVENOUS | Status: DC | PRN
  Administered 2019-06-29: 40 ug/min via INTRAVENOUS

## 2019-06-29 MED ORDER — FENTANYL CITRATE (PF) 100 MCG/2ML IJ SOLN
INTRAMUSCULAR | Status: DC | PRN
  Administered 2019-06-29 (×2): 50 ug via INTRAVENOUS

## 2019-06-29 MED ORDER — LIDOCAINE-EPINEPHRINE (PF) 1 %-1:200000 IJ SOLN
INTRAMUSCULAR | Status: DC | PRN
  Administered 2019-06-29: 30 mL

## 2019-06-29 MED ORDER — PHENYLEPHRINE HCL-NACL 10-0.9 MG/250ML-% IV SOLN
INTRAVENOUS | Status: AC
  Filled 2019-06-29: qty 250

## 2019-06-29 MED ORDER — LIDOCAINE-EPINEPHRINE (PF) 1 %-1:200000 IJ SOLN
INTRAMUSCULAR | Status: AC
  Filled 2019-06-29: qty 30

## 2019-06-29 MED ORDER — MEPERIDINE HCL 25 MG/ML IJ SOLN: 6.2500 mg | INTRAMUSCULAR | Status: DC | PRN

## 2019-06-29 MED ORDER — PHENYLEPHRINE HCL (PRESSORS) 10 MG/ML IV SOLN
INTRAVENOUS | Status: DC | PRN
  Administered 2019-06-29: 120 ug via INTRAVENOUS

## 2019-06-29 MED ORDER — EPHEDRINE SULFATE 50 MG/ML IJ SOLN
INTRAMUSCULAR | Status: DC | PRN
  Administered 2019-06-29: 5 mg via INTRAVENOUS

## 2019-06-29 MED ORDER — FENTANYL CITRATE (PF) 250 MCG/5ML IJ SOLN
INTRAMUSCULAR | Status: AC
  Filled 2019-06-29: qty 5

## 2019-06-29 MED ORDER — SODIUM CHLORIDE 0.9 % IV SOLN
INTRAVENOUS | Status: AC
  Filled 2019-06-29: qty 1.2

## 2019-06-29 MED ORDER — SODIUM CHLORIDE 0.9 % IV SOLN
INTRAVENOUS | Status: DC
  Administered 2019-06-29: 09:00:00 via INTRAVENOUS

## 2019-06-29 MED ORDER — MIDAZOLAM HCL 2 MG/2ML IJ SOLN
INTRAMUSCULAR | Status: AC
  Filled 2019-06-29: qty 2

## 2019-06-29 SURGICAL SUPPLY — 33 items
ARMBAND PINK RESTRICT EXTREMIT (MISCELLANEOUS) ×2 IMPLANT
CANISTER SUCT 3000ML PPV (MISCELLANEOUS) ×2 IMPLANT
CLIP VESOCCLUDE MED 6/CT (CLIP) ×2 IMPLANT
CLIP VESOCCLUDE SM WIDE 6/CT (CLIP) ×2 IMPLANT
COVER PROBE W GEL 5X96 (DRAPES) ×1 IMPLANT
COVER WAND RF STERILE (DRAPES) ×2 IMPLANT
DERMABOND ADVANCED (GAUZE/BANDAGES/DRESSINGS) ×1
DERMABOND ADVANCED .7 DNX12 (GAUZE/BANDAGES/DRESSINGS) ×1 IMPLANT
ELECT REM PT RETURN 9FT ADLT (ELECTROSURGICAL) ×2
ELECTRODE REM PT RTRN 9FT ADLT (ELECTROSURGICAL) ×1 IMPLANT
GLOVE BIO SURGEON STRL SZ 6.5 (GLOVE) ×2 IMPLANT
GLOVE BIO SURGEON STRL SZ7.5 (GLOVE) ×2 IMPLANT
GLOVE BIOGEL PI IND STRL 6.5 (GLOVE) IMPLANT
GLOVE BIOGEL PI IND STRL 7.0 (GLOVE) IMPLANT
GLOVE BIOGEL PI INDICATOR 6.5 (GLOVE) ×2
GLOVE BIOGEL PI INDICATOR 7.0 (GLOVE) ×1
GOWN STRL REUS W/ TWL LRG LVL3 (GOWN DISPOSABLE) ×2 IMPLANT
GOWN STRL REUS W/ TWL XL LVL3 (GOWN DISPOSABLE) ×1 IMPLANT
GOWN STRL REUS W/TWL LRG LVL3 (GOWN DISPOSABLE) ×2
GOWN STRL REUS W/TWL XL LVL3 (GOWN DISPOSABLE) ×1
INSERT FOGARTY SM (MISCELLANEOUS) IMPLANT
KIT BASIN OR (CUSTOM PROCEDURE TRAY) ×2 IMPLANT
KIT TURNOVER KIT B (KITS) ×2 IMPLANT
NS IRRIG 1000ML POUR BTL (IV SOLUTION) ×2 IMPLANT
PACK CV ACCESS (CUSTOM PROCEDURE TRAY) ×2 IMPLANT
PAD ARMBOARD 7.5X6 YLW CONV (MISCELLANEOUS) ×4 IMPLANT
SUT MNCRL AB 4-0 PS2 18 (SUTURE) ×2 IMPLANT
SUT PROLENE 6 0 BV (SUTURE) ×2 IMPLANT
SUT VIC AB 3-0 SH 27 (SUTURE) ×1
SUT VIC AB 3-0 SH 27X BRD (SUTURE) ×1 IMPLANT
TOWEL GREEN STERILE (TOWEL DISPOSABLE) ×2 IMPLANT
UNDERPAD 30X30 (UNDERPADS AND DIAPERS) ×2 IMPLANT
WATER STERILE IRR 1000ML POUR (IV SOLUTION) ×2 IMPLANT

## 2019-06-29 NOTE — Progress Notes (Signed)
Physical Therapy Treatment Patient Details Name: Erika Fry MRN: [id number] DOB: [birth year] Today's Date: 06/29/2019    History of Present Illness 73 y.o. female admitted with bil LE edema, cellulitis with renal failure progression to ESRD with initial fistula placed 7/28, UTI. PMHx: HTN, HLD, diet-controlled diabetes, GERD, hypothyroidism, gout, pacemaker placement, iron deficiency anemia, CKD-4, CHF, peripheral neuropathy, and recent R nephrostomy drain placed 04/2019    PT Comments    Pt pleasant and very agreeable to ambulation and mobility progression. Pt with active PT order and treatment with order D/C'd on 7/25 and reordered on 7/28. Evaluation, deficits and plan remain appropriate and updated goals with continuation of current plan without need for full evaluation.  Pt with significantly improved gait and mobility who lives alone and is not yet able to return home without assist during day. Pt will continue to benefit from CIR to achieve mod I functional level. Pt required assist for pericare in standing due to impaired balance with cues and supervision for all other mobility. Pt encouraged to continue gait with nursing staff and increase HEP performance. Pt unable to tolerate OOB to chair due to pain at sacrum and legs.     Follow Up Recommendations  CIR     Equipment Recommendations  None recommended by PT    Recommendations for Other Services       Precautions / Restrictions Precautions Precautions: Fall Precaution Comments: nephrostomy, sore sacrum Restrictions Weight Bearing Restrictions: No    Mobility  Bed Mobility Overal bed mobility: Modified Independent             General bed mobility comments: pt able to transition to EOB with HOB 15 degrees with reliance on rail, no assist to return to supine. Pt refused OOB to chair stating discomfort for legs and sacrum in chair  Transfers Overall transfer level: Needs assistance   Transfers: Sit to/from  Stand Sit to Stand: Supervision Stand pivot transfers: Supervision       General transfer comment: pt able to stand from bed x 2 and BSC with cues for hand placement with pivot to and from bed<>BSC with use of RW  Ambulation/Gait Ambulation/Gait assistance: Min guard Gait Distance (Feet): 160 Feet Assistive device: Rolling walker (2 wheeled) Gait Pattern/deviations: Step-through pattern;Decreased stride length;Trunk flexed   Gait velocity interpretation: >2.62 ft/sec, indicative of community ambulatory General Gait Details: cues for posture and increased stride. Pt reports decreased ability to walk due to painful left knee   Stairs             Wheelchair Mobility    Modified Rankin (Stroke Patients Only)       Balance Overall balance assessment: Needs assistance   Sitting balance-Leahy Scale: Good Sitting balance - Comments: able to sit EOB without assist   Standing balance support: Bilateral upper extremity supported Standing balance-Leahy Scale: Poor Standing balance comment: requires UE Support                            Cognition Arousal/Alertness: Awake/alert Behavior During Therapy: WFL for tasks assessed/performed Overall Cognitive Status: Within Functional Limits for tasks assessed                                        Exercises General Exercises - Lower Extremity Long Arc Quad: AROM;20 reps;Both;Seated Hip ABduction/ADduction: AROM;20 reps;Both;Seated Hip Flexion/Marching: AROM;20 reps;Both;Seated  General Comments        Pertinent Vitals/Pain Pain Score: 5  Pain Location: left knee, sacrum Pain Descriptors / Indicators: Sore;Tender;Aching Pain Intervention(s): Limited activity within patient's tolerance;Repositioned;Monitored during session    Home Living                      Prior Function            PT Goals (current goals can now be found in the care plan section) Acute Rehab PT Goals Time  For Goal Achievement: 07/13/19 Potential to Achieve Goals: Good Progress towards PT goals: Goals met and updated - see care plan    Frequency    Min 3X/week      PT Plan Current plan remains appropriate    Co-evaluation              AM-PAC PT "6 Clicks" Mobility   Outcome Measure  Help needed turning from your back to your side while in a flat bed without using bedrails?: None Help needed moving from lying on your back to sitting on the side of a flat bed without using bedrails?: A Little Help needed moving to and from a bed to a chair (including a wheelchair)?: A Little Help needed standing up from a chair using your arms (e.g., wheelchair or bedside chair)?: A Little Help needed to walk in hospital room?: A Little Help needed climbing 3-5 steps with a railing? : A Little 6 Click Score: 19    End of Session   Activity Tolerance: Patient tolerated treatment well Patient left: in bed;with call bell/phone within reach Nurse Communication: Mobility status PT Visit Diagnosis: Muscle weakness (generalized) (M62.81);Difficulty in walking, not elsewhere classified (R26.2)     Time: 1224-4975 PT Time Calculation (min) (ACUTE ONLY): 29 min  Charges:  $Gait Training: 8-22 mins $Therapeutic Exercise: 8-22 mins                     Garald Rhew Pam Drown, PT Acute Rehabilitation Services Pager: [phone] Office: [place] 06/29/2019, 1:44 PM

## 2019-06-29 NOTE — Progress Notes (Signed)
Patient ID: Erika Fry, female   DOB: [birth year], 73 y.o.   MRN: [id number]  This NP visited patient at the bedside as a follow up to for palliative medicine needs and emotional support.  Patient is alert and oriented and pleasant as always.  So far so good as she moves forward with dialysis, she has been tolerating treatments well with only complaint being some fatigue.  She now has her fistula.    Patient is open to all offered and available medical interventions to prolong life.  Patient has multiple comorbidities which makes her high risk for decompensation. Disposition and anticipatory care needs remain a concern for patient and her family.  Patient remains weak and deconditioned,  Is hopeful for CIR for rehab.  Discussed with patient the importance of self motivated exercise and mobility and sitting up in the chair during the day. Discussed with patient and placed call to her daughter-in-law Erika Fry.  All are hopeful for inpatient CIR for ongoing rehab with ultimate goal being return home.  Plan of care: -Patient remains full code--encourage patient and family to consider DNR/DNI status knowing poor outcomes in similar patients -Patient and family are open to all offered and available medical interventions to prolong life, all remain hopeful for improvement - HD -Transition to CIR if eligible   Again discussed with patient the importance of continued conversation with her family and their  medical providers regarding overall plan of care and treatment options,  ensuring decisions are within the context of the patients values and GOCs.  Questions and concerns addressed   Updated DIL/Erika Fry  Total time spent on the unit was 35 minutes  Greater than 50% of the time was spent in counseling and coordination of care  Wadie Lessen NP  Palliative Medicine Team Team Phone # [phone] Pager [phone]

## 2019-06-29 NOTE — Progress Notes (Signed)
Patient ID: Erika Fry, female   DOB: 07-21-1946, 73 y.o.   MRN: [id number]  [facility] Progress Note   Assessment/ Plan:   1. ESRD: Newly started on dialysis with progressive underlying chronic kidney disease/difficulties with volume management.  Status post [facility] on 06/24/2019 and today underwent first stage right brachiobasilic fistula creation by Dr. Sarita Haver stage in 6 weeks pending Doppler evaluation for maturity.  I will order for next hemodialysis treatment tomorrow and continue efforts at lowering dry weight.  Discussed fluid restriction. 2. Anemia: Check iron saturations prior to initiating ESA therapy. 3. CKD-MBD: Calcium and phosphorus level within acceptable range, follow-up on PTH.  Not on binder. 4. Nutrition: Continue renal diet with ONS/renal multivitamin. 5. Hypertension: Blood pressures mildly elevated postoperatively, continue to monitor with hemodialysis/ultrafiltration.  Subjective:   Reports to be feeling fatigued from resting in bed.  Denies any chest pain or shortness of breath.   Objective:   BP (!) 123/92 (BP Location: Left Arm)   Pulse 86   Temp 98.3 F (36.8 C) (Oral)   Resp 12   Ht 5\' 5"  (1.651 m)   Wt 73.6 kg   SpO2 96%   BMI 27.00 kg/m   Physical Exam: Gen: Appears comfortable resting in bed, watching television CVS: Pulse regular rhythm, normal rate, S1 and S2 normal Resp: Clear to auscultation bilaterally, no rales/rhonchi.  Right IJ TDC Abd: Soft, flat, nontender, right percutaneous nephrostomy tube in situ. Ext: 1-2+ pitting bilateral lower extremity edema.  Legs wrapped.  Right BBF with audible bruit  Labs: BMET Recent Labs  Lab 06/23/19 0455 06/25/19 0619 06/26/19 2138 06/29/19 0754 06/29/19 0838  NA 136 135 135 134* 131*  K 5.2* 4.6 4.0 4.5 4.3  CL 102 102 99 97*  --   CO2 22 20* 25 27  --   GLUCOSE 115* 99 187* 104* 103*  BUN 111* 87* 27* 20  --   CREATININE 5.56* 4.41* 2.28* 2.38*  --   CALCIUM 10.0 10.2  9.4 9.8  --   PHOS 5.1*  --   --   --   --    CBC Recent Labs  Lab 06/26/19 2138 06/27/19 0543 06/28/19 0836 06/29/19 0754 06/29/19 0838  WBC 9.3 8.6 11.2* 11.4*  --   NEUTROABS 7.0 5.6 7.9* 8.4*  --   HGB 9.0* 9.0* 9.5* 9.2* 9.2*  HCT 28.2* 28.4* 28.5* 28.8* 27.0*  MCV 92.2 91.6 89.9 92.0  --   PLT 267 268 287 242  --    Medications:    . Chlorhexidine Gluconate Cloth  6 each Topical Q0600  . Chlorhexidine Gluconate Cloth  6 each Topical Q0600  . docusate sodium  100 mg Oral BID  . feeding supplement (NEPRO CARB STEADY)  237 mL Oral Daily  . feeding supplement (PRO-STAT SUGAR FREE 64)  30 mL Oral Daily  . ferrous sulfate  325 mg Oral Q breakfast  . gabapentin  100 mg Oral TID  . heparin  5,000 Units Subcutaneous Q8H  . levothyroxine  75 mcg Oral QAC breakfast  . loratadine  10 mg Oral Daily  . multivitamin  1 tablet Oral QHS  . pantoprazole  40 mg Oral Daily  . polyethylene glycol  17 g Oral BID  . senna  1 tablet Oral QHS  . simvastatin  40 mg Oral QHS   Elmarie Shiley, MD 06/29/2019, 11:17 AM

## 2019-06-29 NOTE — Progress Notes (Signed)
Erika Fry  PROGRESS NOTE    EVVA DIN  HDQ:[id number] DOB: Apr 17, 1946 DOA: 06/09/2019 PCP: [name], MD   Brief Narrative:   73 y.o.femalewith medical history significant ofhypertension, hyperlipidemia, diet-controlled diabetes, GERD, hypothyroidism, gout, pacemaker placement, iron deficiency anemia, CKD-4, CHF, who presents with bilateral leg edema.   Assessment & Plan:   Principal Problem:   Bilateral leg edema Active Problems:   Chronic kidney disease (CKD), stage IV (severe) (HCC)   Diet-controlled diabetes mellitus (HCC)   GERD (gastroesophageal reflux disease)   High cholesterol   Hypothyroidism   Iron deficiency anemia   Essential hypertension   UTI (urinary tract infection)   Weakness generalized   Chronic diastolic CHF (congestive heart failure) (HCC)   Cellulitis of lower extremity   Acute on chronic diastolic CHF (congestive heart failure) (HCC)   Hypomagnesemia   DNR (do not resuscitate) discussion   Cystitis   Pain in both lower extremities   ESRD (end stage renal disease) on dialysis ([place])  Acute on chronic diastolic HF BNP 119.4 Echo with EF of 55 to 60%, with left ventricular diastolic Doppler parameters consistent with impaired relaxation Patient had been appropriately diuresed with IV Lasix and torsemide, currently on hold due to worsening renal function Strict I's and O's, daily weights Monitor closely  AKI on STAGE 4 CKD; now ESRD Worsening creatinine, started HD on 06/25/19, tolerating it Diuretics include Lasix, torsemide held Pt has an outpt HD set up (M/W/F) Vascular on board: underwent first stage right brachiobasilic fistula creation by Dr. Sarita Haver stage in 6 weeks pending Doppler evaluation for maturity. Nephrology on board  Pyuria/UTI UA with large leukocytes, greater than 50 WBC, Urine cultures show multiple bacterial morphotypes Blood cultures are negative.  Short course of keflex was ordered; to be completed on 7/24     Cellulitis of bilateral lower extremities  Currently afebrile, with no leukocytosis Completed the course of antibiotics  Hyponatremia Resolved Likely due to volume overload Daily BMP    Type 2 diabetes mellitus Diet controlled  Hypertension Stable  Anemia of chronic kidney disease Hemoglobin at baseline Daily CBC  Hypothyroidism Continue Synthroid   GERD Continue PPI       DVT prophylaxis: heparin Code Status: FULL   Disposition Plan: TBD   Consultants:   Nephrology  Palliative care  Vascular  Antimicrobials:  . None   Subjective: Denies any new complaints  Objective: Vitals:   06/29/19 1024 06/29/19 1040 06/29/19 1059 06/29/19 1705  BP: 94/83 127/74 (!) 123/92 (!) 96/45  Pulse: 88 86  77  Resp: 15 12  18   Temp: 97.8 F (36.6 C)  98.3 F (36.8 C) 98.7 F (37.1 C)  TempSrc:   Oral Oral  SpO2: 97% 97% 96% 93%  Weight:      Height:        Intake/Output Summary (Last 24 hours) at 06/29/2019 1716 Last data filed at 06/29/2019 1300 Gross per 24 hour  Intake 360 ml  Output 425 ml  Net -65 ml   Filed Weights   06/28/19 1605 06/28/19 2034 06/29/19 0813  Weight: 73.5 kg 73.6 kg 73.6 kg    Examination:  General: NAD   Cardiovascular: S1, S2 present  Respiratory: CTAB  Abdomen: Soft, nontender, nondistended, bowel sounds present  Musculoskeletal: +bilateral pedal edema noted  Skin: Normal  Psychiatry: Normal mood   Data Reviewed: I have personally reviewed following labs and imaging studies.  CBC: Recent Labs  Lab 06/25/19 [id number] 06/26/19 2138 06/27/19 0543 06/28/19 0836 06/29/19  5830 06/29/19 0838  WBC 6.8 9.3 8.6 11.2* 11.4*  --   NEUTROABS 4.4 7.0 5.6 7.9* 8.4*  --   HGB 8.6* 9.0* 9.0* 9.5* 9.2* 9.2*  HCT 28.0* 28.2* 28.4* 28.5* 28.8* 27.0*  MCV 94.3 92.2 91.6 89.9 92.0  --   PLT 429* 267 268 287 242  --    Basic Metabolic Panel: Recent Labs  Lab 06/23/19 0455 06/25/19 0619 06/26/19 2138 06/29/19 0754  06/29/19 0838  NA 136 135 135 134* 131*  K 5.2* 4.6 4.0 4.5 4.3  CL 102 102 99 97*  --   CO2 22 20* 25 27  --   GLUCOSE 115* 99 187* 104* 103*  BUN 111* 87* 27* 20  --   CREATININE 5.56* 4.41* 2.28* 2.38*  --   CALCIUM 10.0 10.2 9.4 9.8  --   PHOS 5.1*  --   --   --   --    GFR: Estimated Creatinine Clearance: 20.5 mL/min (A) (by C-G formula based on SCr of 2.38 mg/dL (H)). Liver Function Tests: Recent Labs  Lab 06/23/19 0455  ALBUMIN 2.8*   No results for input(s): LIPASE, AMYLASE in the last 168 hours. No results for input(s): AMMONIA in the last 168 hours. Coagulation Profile: Recent Labs  Lab 06/24/19 0625 06/29/19 0754  INR 1.0 1.1   Cardiac Enzymes: No results for input(s): CKTOTAL, CKMB, CKMBINDEX, TROPONINI in the last 168 hours. BNP (last 3 results) No results for input(s): PROBNP in the last 8760 hours. HbA1C: No results for input(s): HGBA1C in the last 72 hours. CBG: Recent Labs  Lab 06/25/19 1634 06/25/19 2048 06/26/19 0700 06/26/19 1118 06/26/19 1724  GLUCAP 156* 102* 95 144* 109*   Lipid Profile: No results for input(s): CHOL, HDL, LDLCALC, TRIG, CHOLHDL, LDLDIRECT in the last 72 hours. Thyroid Function Tests: No results for input(s): TSH, T4TOTAL, FREET4, T3FREE, THYROIDAB in the last 72 hours. Anemia Panel: No results for input(s): VITAMINB12, FOLATE, FERRITIN, TIBC, IRON, RETICCTPCT in the last 72 hours. Sepsis Labs: No results for input(s): PROCALCITON, LATICACIDVEN in the last 168 hours.  Recent Results (from the past 240 hour(s))  Culture, Urine     Status: Abnormal   Collection Time: 06/20/19  9:14 AM   Specimen: Urine, Random  Result Value Ref Range Status   Specimen Description   Final    URINE, RANDOM Performed at [facility] [address], [place], [place] [address]    Special Requests   Final    NONE Performed at [facility], [place] [address] [place], [place] [address]    Culture  MULTIPLE SPECIES PRESENT, SUGGEST RECOLLECTION (A)  Final   Report Status 06/21/2019 FINAL  Final  Culture, blood (Routine X 2) w Reflex to ID Panel     Status: None   Collection Time: 06/20/19 10:30 AM   Specimen: BLOOD  Result Value Ref Range Status   Specimen Description   Final    BLOOD RIGHT ANTECUBITAL Performed at [facility], [place] [address], [place], [place] [address]    Special Requests   Final    BOTTLES DRAWN AEROBIC ONLY Blood Culture adequate volume Performed at [facility] [address] [place], [place] [address]    Culture   Final    NO GROWTH 5 DAYS Performed at [facility], [place] [address] [place], [place] [address]    Report Status 06/25/2019 FINAL  Final  Culture, blood (Routine X 2) w  Reflex to ID Panel     Status: None   Collection Time: 06/20/19 10:30 AM   Specimen: BLOOD  Result Value Ref Range Status   Specimen Description   Final    BLOOD LEFT ANTECUBITAL Performed at [facility], [place] [address] [place], [place] [address]    Special Requests   Final    BOTTLES DRAWN AEROBIC ONLY Blood Culture adequate volume Performed at [facility] [address], [place], [place] [address]    Culture   Final    NO GROWTH 5 DAYS Performed at [facility], [place] [address], [place], [place] [address]    Report Status 06/25/2019 FINAL  Final  MRSA PCR Screening     Status: None   Collection Time: 06/28/19 11:45 PM   Specimen: Nasal Mucosa; Nasopharyngeal  Result Value Ref Range Status   MRSA by PCR NEGATIVE NEGATIVE Final    Comment:        The GeneXpert MRSA Assay (FDA approved for NASAL specimens only), is one component of a comprehensive MRSA colonization surveillance program. It is not intended to diagnose MRSA infection nor to guide or monitor treatment for MRSA infections. Performed at [facility], [place] [address], [place], [place] [address]          Radiology  Studies: No results found.      Scheduled Meds: . Chlorhexidine Gluconate Cloth  6 each Topical Q0600  . Chlorhexidine Gluconate Cloth  6 each Topical Q0600  . docusate sodium  100 mg Oral BID  . feeding supplement (NEPRO CARB STEADY)  237 mL Oral Daily  . feeding supplement (PRO-STAT SUGAR FREE 64)  30 mL Oral Daily  . ferrous sulfate  325 mg Oral Q breakfast  . gabapentin  100 mg Oral TID  . heparin  5,000 Units Subcutaneous Q8H  . levothyroxine  75 mcg Oral QAC breakfast  . loratadine  10 mg Oral Daily  . multivitamin  1 tablet Oral QHS  . pantoprazole  40 mg Oral Daily  . polyethylene glycol  17 g Oral BID  . senna  1 tablet Oral QHS  . simvastatin  40 mg Oral QHS   Continuous Infusions: . sodium chloride       LOS: 19 days         Alma Friendly, MD [facility]  If 7PM-7AM, please contact night-coverage [email or web address]  06/29/2019, 5:16 PM

## 2019-06-29 NOTE — Anesthesia Preprocedure Evaluation (Addendum)
Anesthesia Evaluation  Patient identified by MRN, date of birth, ID band Patient awake    Reviewed: Allergy & Precautions, NPO status , Patient's Chart, lab work & pertinent test results  History of Anesthesia Complications Negative for: history of anesthetic complications  Airway Mallampati: II  TM Distance: >3 FB Neck ROM: Full    Dental  (+) Chipped, Dental Advisory Given   Pulmonary neg pulmonary ROS,    breath sounds clear to auscultation       Cardiovascular hypertension, Pt. on medications (-) angina+ pacemaker  Rhythm:Regular Rate:Normal  01/2019 ECHO: EF 55-60%, valves OK   Neuro/Psych  Headaches,    GI/Hepatic Neg liver ROS, GERD  Medicated and Controlled,  Endo/Other  diabetes (glu 103)Hypothyroidism   Renal/GU ESRFRenal disease (creat 2.28, K+ 4.3)     Musculoskeletal   Abdominal   Peds  Hematology  (+) Blood dyscrasia (Hb 9.5), anemia ,   Anesthesia Other Findings   Reproductive/Obstetrics                            Anesthesia Physical Anesthesia Plan  ASA: III  Anesthesia Plan: MAC   Post-op Pain Management:    Induction:   PONV Risk Score and Plan: 2 and Treatment may vary due to age or medical condition  Airway Management Planned: Simple Face Mask and Natural Airway  Additional Equipment:   Intra-op Plan:   Post-operative Plan:   Informed Consent: I have reviewed the patients History and Physical, chart, labs and discussed the procedure including the risks, benefits and alternatives for the proposed anesthesia with the patient or authorized representative who has indicated his/her understanding and acceptance.     Dental advisory given  Plan Discussed with: CRNA and Surgeon  Anesthesia Plan Comments:        Anesthesia Quick Evaluation

## 2019-06-29 NOTE — Op Note (Signed)
    Patient name: Erika Fry MRN: [id number] DOB: [birth year] Sex: female  06/29/2019 Pre-operative Diagnosis: End-stage renal disease and Post-operative diagnosis:  Same Surgeon:  Eda Paschal. Donzetta Matters, MD Assistant: Ellsworth Lennox, RN Procedure Performed: Right arm brachial artery to basilic vein AV fistula creation  Indications: 73 year old female with end-stage renal disease.  Currently dialyzing via right IJ tunnel catheter.  Now in need of permanent access.  She is right-hand dominant but has left-sided pacemaker.  Peers to have suitable basilic vein on preoperative vein mapping for fistula creation.  Findings: By on table ultrasound cephalic vein with large the antecubital but significantly diseased and sclerotic throughout the upper arm.  The basilic vein was large approximately 4 mm in diameter.  The brachial artery was 4 mm external diameter free of disease.  After fistula creation there was a strong thrill in the runoff vein palpable radial pulse the wrist both confirmed with Doppler.  Patient will need second stage basilic vein after 6-weeks time.   Procedure:  The patient was identified in the holding area and taken to the operating room where she is placed supine operative table MAC anesthesia induced.  She was sterilely prepped draped in the right upper extremity usual fashion antibiotics were minister timeout was called.  Ultrasound was used to identify the basilic vein and brachial artery.  The area was anesthetized 1% lidocaine with epinephrine.  Transverse incision was made between the 2 above the antecubitum.  We first dissected out the vein divided branched and clips and ties.  The vein was more for orientation.  We dissected through the deep fascia identified the brachial artery and placed Vesseloops around.  I did have to transect 1 branch of her superficial nerve to allow for adequate setting of the fistula.  We transected the vein distally tied off.  We dilated it plus with  heparinized saline clamped.  The artery was clamped distally proximally flushed heparinized saline both directions.  The vein was then sewn end-to-side with 6-0 Prolene suture.  Proximally syndesmosis we well flushing all direction.  Upon completion there was a strong thrill in the vein this was confirmed with Doppler.  There was also palpable radial pulse did not augment with compression of the vein.  This too was confirmed with Doppler.  Satisfied we irrigated the wound obtain hemostasis closed in layers of Vicryl Monocryl.  Dermabond placed to level skin.  She is awakened anesthesia having tolerated procedure without immediate complication.  All counts were correct at completion.  EBL: 20 cc   Ranie Chinchilla C. Donzetta Matters, MD [facility] Office: [phone] Pager: [phone]

## 2019-06-29 NOTE — Progress Notes (Signed)
  Progress Note    06/29/2019 8:30 AM Day of Surgery  Subjective: No acute issues  Vitals:   06/28/19 2034 06/29/19 0449  BP: (!) 176/100 136/65  Pulse: 93 79  Resp: 20 17  Temp: 98.8 F (37.1 C) 98.8 F (37.1 C)  SpO2: 100% 97%    Physical Exam: Awake alert oriented On the respirations Right arm has hematoma the antecubital Palpable brachial radial pulses on the right CBC    Component Value Date/Time   WBC 11.2 (H) 06/28/2019 0836   RBC 3.17 (L) 06/28/2019 0836   HGB 9.5 (L) 06/28/2019 0836   HCT 28.5 (L) 06/28/2019 0836   HCT 20.1 (L) 05/13/2018 0427   PLT 287 06/28/2019 0836   MCV 89.9 06/28/2019 0836   MCH 30.0 06/28/2019 0836   MCHC 33.3 06/28/2019 0836   RDW 13.7 06/28/2019 0836   LYMPHSABS 1.7 06/28/2019 0836   MONOABS 0.9 06/28/2019 0836   EOSABS 0.3 06/28/2019 0836   BASOSABS 0.1 06/28/2019 0836    BMET    Component Value Date/Time   NA 135 06/26/2019 2138   NA 134 07/02/2018 1409   K 4.0 06/26/2019 2138   CL 99 06/26/2019 2138   CO2 25 06/26/2019 2138   GLUCOSE 187 (H) 06/26/2019 2138   BUN 27 (H) 06/26/2019 2138   BUN 48 (H) 07/02/2018 1409   CREATININE 2.28 (H) 06/26/2019 2138   CREATININE 3.24 (H) 12/21/2018 1029   CALCIUM 9.4 06/26/2019 2138   CALCIUM 9.6 10/31/2017 1050   GFRNONAA 20 (L) 06/26/2019 2138   GFRAA 24 (L) 06/26/2019 2138    INR    Component Value Date/Time   INR 1.0 06/24/2019 0625     Intake/Output Summary (Last 24 hours) at 06/29/2019 0830 Last data filed at 06/29/2019 0700 Gross per 24 hour  Intake 600 ml  Output 1960 ml  Net -1360 ml     Assessment/plan:  73 y.o. female is here with end-stage renal disease.  Tunnel dialysis catheter has been placed worked well for dialysis yesterday.  Now in need of fistula on the right has pacemaker on the left.  Palpable brachial and radial pulses on the right.  Suitable basilic vein by preoperative duplex.  We discussed risk benefits alternatives we will plan to proceed  with fistula versus graft right upper extremity.   Taeshaun Rames C. Donzetta Matters, MD [facility] [place] Office: [phone] Pager: [phone]  06/29/2019 8:30 AM

## 2019-06-29 NOTE — Progress Notes (Signed)
Inpatient Rehabilitation-Admissions Coordinator   [facility] continues to follow. Noted pt nearing medical readiness for rehab. AC will need updated therapy notes to submit for insurance approval for CIR.   Noted no current PT/OT orders in the chart. AC will contact MD to request therapy orders.   Please call if questions.   [name], OTR/L  Rehab Admissions Coordinator  [phone] 06/29/2019 10:53 AM

## 2019-06-29 NOTE — Anesthesia Postprocedure Evaluation (Signed)
Anesthesia Post Note  Patient: Rosezetta Balderston Lalley  Procedure(s) Performed: ARTERIOVENOUS (AV) FISTULA CREATION RIGHT UPPER  ARM (Right Arm Upper)     Patient location during evaluation: PACU Anesthesia Type: MAC Level of consciousness: awake and alert, oriented and patient cooperative Pain management: pain level controlled Vital Signs Assessment: post-procedure vital signs reviewed and stable Respiratory status: spontaneous breathing, nonlabored ventilation and respiratory function stable Cardiovascular status: blood pressure returned to baseline and stable Postop Assessment: no apparent nausea or vomiting Anesthetic complications: no    Last Vitals:  Vitals:   06/29/19 1040 06/29/19 1059  BP: 127/74 (!) 123/92  Pulse: 86   Resp: 12   Temp:  36.8 C  SpO2: 97% 96%    Last Pain:  Vitals:   06/29/19 1448  TempSrc:   PainSc: Asleep                 Coralyn Roselli,E. Renesme Kerrigan

## 2019-06-29 NOTE — Progress Notes (Signed)
Chart reviewed for LOS B Roselia Snipe RN,MHA,BSN Advance Care Supervisor [phone] 

## 2019-06-29 NOTE — Transfer of Care (Signed)
Immediate Anesthesia Transfer of Care Note  Patient: Erika Fry  Procedure(s) Performed: ARTERIOVENOUS (AV) FISTULA CREATION RIGHT UPPER  ARM (Right Arm Upper)  Patient Location: PACU  Anesthesia Type:MAC  Level of Consciousness: awake, alert , oriented and patient cooperative  Airway & Oxygen Therapy: Patient Spontanous Breathing and Patient connected to nasal cannula oxygen  Post-op Assessment: Report given to RN and Post -op Vital signs reviewed and stable  Post vital signs: Reviewed and stable   Last Vitals:  Vitals Value Taken Time  BP 94/83 06/29/19 1024  Temp    Pulse 93 06/29/19 1025  Resp 15 06/29/19 1025  SpO2 98 % 06/29/19 1025  Vitals shown include unvalidated device data.  Last Pain:  Vitals:   06/29/19 0700  TempSrc:   PainSc: 2       Patients Stated Pain Goal: 2 (14/10/30 1314)  Complications: No apparent anesthesia complications

## 2019-06-30 ENCOUNTER — Encounter (HOSPITAL_COMMUNITY): Payer: Self-pay | Admitting: Vascular Surgery

## 2019-06-30 LAB — CBC
HCT: 26.6 % — ABNORMAL LOW (ref 36.0–46.0)
Hemoglobin: 8.2 g/dL — ABNORMAL LOW (ref 12.0–15.0)
MCH: 29.3 pg (ref 26.0–34.0)
MCHC: 30.8 g/dL (ref 30.0–36.0)
MCV: 95 fL (ref 80.0–100.0)
Platelets: 227 10*3/uL (ref 150–400)
RBC: 2.8 MIL/uL — ABNORMAL LOW (ref 3.87–5.11)
RDW: 14.1 % (ref 11.5–15.5)
WBC: 10.3 10*3/uL (ref 4.0–10.5)
nRBC: 0 % (ref 0.0–0.2)

## 2019-06-30 LAB — CBC WITH DIFFERENTIAL/PLATELET
Abs Immature Granulocytes: 0.2 10*3/uL — ABNORMAL HIGH (ref 0.00–0.07)
Basophils Absolute: 0.1 10*3/uL (ref 0.0–0.1)
Basophils Relative: 1 %
Eosinophils Absolute: 0.2 10*3/uL (ref 0.0–0.5)
Eosinophils Relative: 2 %
HCT: 27.3 % — ABNORMAL LOW (ref 36.0–46.0)
Hemoglobin: 8.4 g/dL — ABNORMAL LOW (ref 12.0–15.0)
Immature Granulocytes: 2 %
Lymphocytes Relative: 16 %
Lymphs Abs: 1.6 10*3/uL (ref 0.7–4.0)
MCH: 28.7 pg (ref 26.0–34.0)
MCHC: 30.8 g/dL (ref 30.0–36.0)
MCV: 93.2 fL (ref 80.0–100.0)
Monocytes Absolute: 0.9 10*3/uL (ref 0.1–1.0)
Monocytes Relative: 9 %
Neutro Abs: 7.1 10*3/uL (ref 1.7–7.7)
Neutrophils Relative %: 70 %
Platelets: 226 10*3/uL (ref 150–400)
RBC: 2.93 MIL/uL — ABNORMAL LOW (ref 3.87–5.11)
RDW: 14.2 % (ref 11.5–15.5)
WBC: 10 10*3/uL (ref 4.0–10.5)
nRBC: 0 % (ref 0.0–0.2)

## 2019-06-30 LAB — IRON AND TIBC
Iron: 40 ug/dL (ref 28–170)
Saturation Ratios: 21 % (ref 10.4–31.8)
TIBC: 195 ug/dL — ABNORMAL LOW (ref 250–450)
UIBC: 155 ug/dL

## 2019-06-30 LAB — BASIC METABOLIC PANEL
Anion gap: 11 (ref 5–15)
BUN: 31 mg/dL — ABNORMAL HIGH (ref 8–23)
CO2: 24 mmol/L (ref 22–32)
Calcium: 9.8 mg/dL (ref 8.9–10.3)
Chloride: 98 mmol/L (ref 98–111)
Creatinine, Ser: 3.44 mg/dL — ABNORMAL HIGH (ref 0.44–1.00)
GFR calc Af Amer: 14 mL/min — ABNORMAL LOW (ref >60)
GFR calc non Af Amer: 12 mL/min — ABNORMAL LOW (ref >60)
Glucose, Bld: 104 mg/dL — ABNORMAL HIGH (ref 70–99)
Potassium: 4.6 mmol/L (ref 3.5–5.1)
Sodium: 133 mmol/L — ABNORMAL LOW (ref 135–145)

## 2019-06-30 LAB — FERRITIN: Ferritin: 396 ng/mL — ABNORMAL HIGH (ref 11–307)

## 2019-06-30 MED ORDER — DARBEPOETIN ALFA 100 MCG/0.5ML IJ SOSY
100.0000 ug | PREFILLED_SYRINGE | INTRAMUSCULAR | Status: DC
  Administered 2019-06-30: 100 ug via INTRAVENOUS

## 2019-06-30 MED ORDER — HEPARIN SODIUM (PORCINE) 1000 UNIT/ML DIALYSIS
40.0000 [IU]/kg | INTRAMUSCULAR | Status: DC | PRN
  Administered 2019-06-30: 10:00:00 3200 [IU] via INTRAVENOUS_CENTRAL
  Filled 2019-06-30: qty 2.9

## 2019-06-30 MED ORDER — OXYCODONE HCL 5 MG PO TABS: 5.0000 mg | ORAL_TABLET | Freq: Four times a day (QID) | ORAL | 0 refills | Status: DC | PRN

## 2019-06-30 MED ORDER — FLAVOXATE HCL 100 MG PO TABS: 100.0000 mg | ORAL_TABLET | Freq: Three times a day (TID) | ORAL | 0 refills | Status: DC | PRN

## 2019-06-30 MED ORDER — DARBEPOETIN ALFA 100 MCG/0.5ML IJ SOSY
PREFILLED_SYRINGE | INTRAMUSCULAR | Status: AC
  Filled 2019-06-30: qty 0.5

## 2019-06-30 MED ORDER — MIDODRINE HCL 5 MG PO TABS: 5.0000 mg | ORAL_TABLET | Freq: Every day | ORAL | 0 refills | Status: DC

## 2019-06-30 MED ORDER — SODIUM CHLORIDE 0.9 % IV SOLN
250.0000 mg | INTRAVENOUS | Status: DC
  Administered 2019-06-30: 250 mg via INTRAVENOUS
  Filled 2019-06-30 (×2): qty 20

## 2019-06-30 MED ORDER — HEPARIN SODIUM (PORCINE) 1000 UNIT/ML IJ SOLN
INTRAMUSCULAR | Status: AC
  Administered 2019-06-30: 3200 [IU] via INTRAVENOUS_CENTRAL
  Filled 2019-06-30: qty 4

## 2019-06-30 MED FILL — oxyCODONE HCL 5 MG TABS: 5 | 4 days supply | Qty: 15 | Fill #0

## 2019-06-30 MED FILL — MIDODRINE HCL 5 MG TABLET: 5 | 30 days supply | Qty: 30 | Fill #0

## 2019-06-30 NOTE — TOC Transition Note (Signed)
Transition of Care [facility]) - CM/SW Discharge Note   Patient Details  Name: Erika Fry MRN: [id number] Date of Birth: [birth year]  Transition of Care [facility]) CM/SW Contact:  Bartholomew Crews, RN Phone Number: [phone] 06/30/2019, 4:19 PM   Clinical Narrative:    Spoke with patient's daughter in law - Erika Fry on the phone. Answered all questions - reviewed medications and AVS. Requested scripts for flavoxate and oxycodone that were listed on AVS - agreed to [facility]. Pharmacy advised that flavoxate is not covered by patient's insurance -$36. Patient and daughter in law declined to get in hospital and will seek refill as outpatient. Pharmacy to call Erika Fry to collect copay. Medications midodrine and oxycodone to be delivered to bedside. Patient to call son for transport when ready.    Final next level of care: [place] Barriers to Discharge: No Barriers Identified   Patient Goals and CMS Choice Patient states their goals for this hospitalization and ongoing recovery are:: return home with home health CMS Medicare.gov Compare Post Acute Care list provided to:: Patient Choice offered to / list presented to : Patient  Discharge Placement                       Discharge Plan and Services In-house Referral: Clinical Social Work Discharge Planning Services: CM Consult Post Acute Care Choice: Home Health, Dialysis          DME Arranged: N/A DME Agency: NA       HH Arranged: RN, PT, OT [place] Agency: [facility] [employer]) Date [place]: 06/30/19 Time [place]: 1516 Representative spoke with at [place]: [place]ol[place] ([place]) Interventions     Readmission Risk Interventions Readmission Risk Prevention Plan 06/15/2019 04/13/2019  Transportation Screening Complete Complete  Medication Review Press photographer) Complete Complete  PCP or Specialist appointment within  3-5 days of discharge Not Complete Complete  PCP/Specialist Appt Not Complete comments not ready for dc -  HRI or [place] Complete Complete  SW Recovery Care/Counseling Consult Complete Complete  Palliative Care Screening Not Applicable Not [place] Not Applicable Patient Refused  Some recent data might be hidden

## 2019-06-30 NOTE — Progress Notes (Signed)
   06/30/19 1521  Vital Signs  Patient Position (if appropriate) Orthostatic Vitals  Orthostatic Lying   BP- Lying 100/55  Pulse- Lying 93  Orthostatic Sitting  BP- Sitting 96/66  Pulse- Sitting 105  Orthostatic Standing at 0 minutes  BP- Standing at 0 minutes 94/51  Pulse- Standing at 0 minutes 106  Orthostatic Standing at 3 minutes  BP- Standing at 3 minutes (!) 68/40 and symptomatic for dizziness  Pulse- Standing at 3 minutes 116    Full note to follow,   Roney Marion, [place] Pager [phone] Office [phone]

## 2019-06-30 NOTE — Progress Notes (Signed)
Physical Therapy Treatment Patient Details Name: Erika Fry MRN: [id number] DOB: Aug 16, 1946 Today's Date: 06/30/2019    History of Present Illness 73 y.o. female admitted with bil LE edema, cellulitis with renal failure progression to ESRD with initial fistula placed 7/28, UTI. PMHx: HTN, HLD, diet-controlled diabetes, GERD, hypothyroidism, gout, pacemaker placement, iron deficiency anemia, CKD-4, CHF, peripheral neuropathy, and recent R nephrostomy drain placed 04/2019    PT Comments    Continuing work on functional mobility and activity tolerance;  Session focused on obtaining Orthostatic BPs as noted she bacame dizzy with OT and Lanelle Bal, RN, reported she became dizzy while toileting as well; See previous note for specifics; Low BP at 3 minutes reported to [place], Therapist, sports; I'm concerned for her safety and activity tolerance at home.  Follow Up Recommendations  Home health PT     Equipment Recommendations  Rolling walker with 5" wheels;3in1 (PT)    Recommendations for Other Services       Precautions / Restrictions Precautions Precautions: Fall Precaution Comments: nephrostomy, sore sacrum; BP drop at 3 mins standing Restrictions Weight Bearing Restrictions: No    Mobility  Bed Mobility Overal bed mobility: Modified Independent Bed Mobility: Sit to Supine;Supine to Sit     Supine to sit: Modified independent (Device/Increase time) Sit to supine: Modified independent (Device/Increase time)   General bed mobility comments: pt able to transition to EOB with HOB 15 degrees with reliance on rail, no assist to return to supine. Pt reports has hospital bed and BSC at home.  Transfers Overall transfer level: Needs assistance Equipment used: Rolling walker (2 wheeled) Transfers: Sit to/from [facility] Sit to Stand: Supervision Stand pivot transfers: Supervision       General transfer comment: Supervision for safety, and cues to self-monitor for activity  tolerance  Ambulation/Gait                 Stairs             Wheelchair Mobility    Modified Rankin (Stroke Patients Only)       Balance                                            Cognition Arousal/Alertness: Awake/alert Behavior During Therapy: WFL for tasks assessed/performed Overall Cognitive Status: Within Functional Limits for tasks assessed                                        Exercises      General Comments        Pertinent Vitals/Pain Pain Assessment: Faces Faces Pain Scale: Hurts little more Pain Location: sacrum Pain Descriptors / Indicators: Sore;Tender;Aching Pain Intervention(s): Monitored during session    Home Living                      Prior Function            PT Goals (current goals can now be found in the care plan section) Acute Rehab PT Goals Patient Stated Goal: less pain; was ho;ing to go home today PT Goal Formulation: With patient Time For Goal Achievement: 07/13/19 Potential to Achieve Goals: Good Progress towards PT goals: Progressing toward goals(though limtied by orhtostasis)    Frequency    Min 3X/week  PT Plan Discharge plan needs to be updated    Co-evaluation              AM-PAC PT "6 Clicks" Mobility   Outcome Measure  Help needed turning from your back to your side while in a flat bed without using bedrails?: None Help needed moving from lying on your back to sitting on the side of a flat bed without using bedrails?: None Help needed moving to and from a bed to a chair (including a wheelchair)?: None Help needed standing up from a chair using your arms (e.g., wheelchair or bedside chair)?: None Help needed to walk in hospital room?: A Little Help needed climbing 3-5 steps with a railing? : A Lot 6 Click Score: 21    End of Session   Activity Tolerance: Other (comment)(Limited standing tolerance) Patient left: in bed;with call  bell/phone within reach Nurse Communication: Mobility status;Other (comment)(and BP drop at 3 min standing) PT Visit Diagnosis: Muscle weakness (generalized) (M62.81);Difficulty in walking, not elsewhere classified (R26.2) Pain - part of body: Leg     Time: 4315-4008 PT Time Calculation (min) (ACUTE ONLY): 35 min  Charges:  $Therapeutic Activity: 23-37 mins                     Roney Marion, PT  Acute Rehabilitation Services Pager [phone] Office [place] 06/30/2019, 4:46 PM

## 2019-06-30 NOTE — Progress Notes (Signed)
DISCHARGE NOTE HOME Erika Fry to be discharged Home per MD order. Discussed prescriptions and follow up appointments with the patient. Prescriptions given to patient; medication list explained in detail. Patient verbalized understanding.  Skin clean, dry and intact without evidence of skin break down, no evidence of skin tears noted. IV catheter discontinued intact. Site without signs and symptoms of complications. Dressing and pressure applied. Pt denies pain at the site currently. No complaints noted.  Patient free of lines, drains, and wounds.   An After Visit Summary (AVS) was printed and given to the patient. Patient escorted via wheelchair, and discharged home via private auto.  [name], RN

## 2019-06-30 NOTE — Progress Notes (Signed)
OT Cancellation Note  Patient Details Name: Erika Fry MRN: [id number] DOB: 29-Apr-1946   Cancelled Treatment:    Reason Eval/Treat Not Completed: Patient at procedure or test/ unavailable   Pt current in HD; will attempt to follow up later today as time allows.  Simonne Come 06/30/2019, 9:21 AM

## 2019-06-30 NOTE — Plan of Care (Signed)
  Problem: Activity: Goal: Activity intolerance will improve Outcome: Progressing   

## 2019-06-30 NOTE — Discharge Summary (Addendum)
Physician Discharge Summary  Erika Fry YDX:[id number] DOB: 1945-12-24 DOA: 06/09/2019  PCP: [name], MD  Admit date: 06/09/2019 Discharge date: 06/30/2019  Admitted From: Home Disposition:  Home  Discharge Condition:Stable CODE STATUS:FULL Diet recommendation: Heart Healthy  Brief/Interim Summary: Patient is a 73 y.o.femalewith medical history significant ofhypertension, hyperlipidemia, diet-controlled diabetes, GERD, hypothyroidism, gout, pacemaker placement, iron deficiency anemia, CKD-4, CHF, who presents with bilateral leg edema.  Nephrology was consulted.  Her kidney function continued to deteriorate and she was started on dialysis.  Vascular surgery also consulted for AV graft creation.  She was initially planned for discharge to CIR but she did really well .  She will be discharged today to home with home health.  Outpatient hemodialysis facility has been set up.  Following problems were addressed during her hospitalization:   AKI on STAGE 4 CKD; now ESRD Worsening creatinine, started HD on 06/25/19, tolerating it Diuretics include Lasix, torsemide held Pt has an outpt HD set up (M/W/F) Vascular on board: underwent first stage right brachiobasilic fistula creation by Dr. Sarita Haver stage in 6 weeks pending Doppler evaluation for maturity. Nephrology was on board  Acute on chronic diastolic HF Most likely exacerbated due to worsening renal function. Echo with EF of 55 to 60%, with left ventricular diastolic Doppler parameters consistent with impaired relaxation  Pyuria/UTI UA with large leukocytes, greater than 50 WBC, Urine cultures show multiple bacterial morphotypes Blood cultures are negative.  Short course of keflex was ordered;  completed on 7/24    Cellulitis of bilateral lower extremities  Currently afebrile, with no leukocytosis Completed the course of antibiotics  Hyponatremia Resolved    Type 2 diabetes mellitus Diet  controlled  Hypertension Stable  Anemia of chronic kidney disease Hemoglobin at baseline  Hypothyroidism Continue Synthroid   GERD Continue PPI    Discharge Diagnoses:  Principal Problem:   Bilateral leg edema Active Problems:   Chronic kidney disease (CKD), stage IV (severe) (HCC)   Diet-controlled diabetes mellitus (HCC)   GERD (gastroesophageal reflux disease)   High cholesterol   Hypothyroidism   Iron deficiency anemia   Essential hypertension   UTI (urinary tract infection)   Weakness generalized   Chronic diastolic CHF (congestive heart failure) (HCC)   Cellulitis of lower extremity   Acute on chronic diastolic CHF (congestive heart failure) ([place])   Hypomagnesemia   DNR (do not resuscitate) discussion   Cystitis   Pain in both lower extremities   ESRD (end stage renal disease) on dialysis [facility])    Discharge Instructions  Discharge Instructions    Diet - low sodium heart healthy   Complete by: As directed    Discharge instructions   Complete by: As directed    1)Please follow-up with your PCP in a week. 2)Outpatient dialysis has been set up for tomorrow. 3)Follow up with vascular surgery as an outpatient.Name and number of the provider has been attached.   Increase activity slowly   Complete by: As directed      Allergies as of 06/30/2019      Reactions   Baclofen Other (See Comments)   somnolence   Codeine Other (See Comments)   Increases Pain and couldn't sleep      Medication List    STOP taking these medications   darifenacin 15 MG 24 hr tablet Commonly known as: ENABLEX   folic acid 1 MG tablet Commonly known as: FOLVITE   furosemide 80 MG tablet Commonly known as: LASIX   potassium chloride SA 20 MEQ  tablet Commonly known as: K-DUR   sodium bicarbonate 650 MG tablet   vitamin B-12 1000 MCG tablet Commonly known as: CYANOCOBALAMIN     TAKE these medications   acetaminophen 325 MG tablet Commonly known as: TYLENOL Take  1-2 tablets (325-650 mg total) by mouth every 4 (four) hours as needed for mild pain.   Biotin 5 MG Caps Take 1 capsule (5 mg total) by mouth daily.   cetirizine 10 MG tablet Commonly known as: ZYRTEC Take 10 mg by mouth at bedtime.   docusate sodium 100 MG capsule Commonly known as: COLACE Take 1 capsule (100 mg total) by mouth 2 (two) times daily. What changed:   when to take this  reasons to take this   ferrous sulfate 325 (65 FE) MG tablet Take 1 tablet (325 mg total) by mouth 2 (two) times daily with a meal.   flavoxATE 100 MG tablet Commonly known as: URISPAS Take 1 tablet (100 mg total) by mouth 3 (three) times daily as needed (bladder spasms, dysuria).   fluticasone 50 MCG/ACT nasal spray Commonly known as: FLONASE Place 2 sprays into both nostrils daily as needed for allergies or rhinitis.   gabapentin 100 MG capsule Commonly known as: NEURONTIN Take 1 capsule (100 mg total) by mouth 3 (three) times daily. What changed:   when to take this  reasons to take this   levothyroxine 75 MCG tablet Commonly known as: SYNTHROID Take 1 tablet (75 mcg total) by mouth daily before breakfast. Notes to patient: Take 07/01/19   midodrine 5 MG tablet Commonly known as: PROAMATINE Take 1 tablet (5 mg total) by mouth daily.   MULTI-VITAMIN PO Take 1 tablet by mouth daily.   Muscle Rub 10-15 % Crea Apply 1 application topically every 4 (four) hours as needed for muscle pain.   oxyCODONE 5 MG immediate release tablet Commonly known as: Oxy IR/ROXICODONE Take 1 tablet (5 mg total) by mouth every 6 (six) hours as needed for severe pain.   pantoprazole 40 MG tablet Commonly known as: PROTONIX Take 40 mg by mouth 2 (two) times daily.   polyethylene glycol 17 g packet Commonly known as: MIRALAX / GLYCOLAX Take 17 g by mouth 2 (two) times daily. What changed:   when to take this  reasons to take this   simvastatin 40 MG tablet Commonly known as: ZOCOR Take 1  tablet (40 mg total) by mouth at bedtime.            Durable Medical Equipment  (From admission, onward)         Start     Ordered   06/19/19 1207  For home use only DME Hospital bed  Once    Question Answer Comment  Length of Need Lifetime   Patient has (list medical condition): stage 5 CKD,  DIASTOLIC HEART FAILURE, SEVERE DIABETIC NEUROPATHY, DECONDITIONED.   The above medical condition requires: Patient requires the ability to reposition frequently   Head must be elevated greater than: 30 degrees   Bed type Semi-electric      06/19/19 1209         Follow-up Information    Avva, Ravisankar, MD. Schedule an appointment as soon as possible for a visit in 1 week(s).   Specialty: Internal Medicine Contact information: [place] [address] [phone]        Waynetta Sandy, MD In 6 weeks.   Specialties: Vascular Surgery, Cardiology Why: Office will call you to arrange your appt (sent)  Contact information: [address] [place] [address] [phone]        Home, [facility] Follow up.   Specialty: [place] Why: skilled nurse, physical therapy, and occupational therapy Contact information: [place] [address] [phone]          Allergies  Allergen Reactions  . Baclofen Other (See Comments)    somnolence  . Codeine Other (See Comments)    Increases Pain and couldn't sleep    Consultations:  Nephrology, vascular surgery   Procedures/Studies: Dg Chest 2 View  Result Date: 06/20/2019 CLINICAL DATA:  Fever. EXAM: CHEST - 2 VIEW COMPARISON:  Radiographs of February 10, 2019. FINDINGS: Stable cardiomegaly. Left-sided pacemaker is unchanged in position. No pneumothorax is noted. Right lung is clear. Mild left basilar atelectasis or infiltrate is noted with possible pleural effusion. Bony thorax unremarkable. IMPRESSION: Mild left basilar subsegmental atelectasis or infiltrate is noted  with possible left pleural effusion. Aortic Atherosclerosis (ICD10-I70.0). Electronically Signed   By: Marijo Conception M.D.   On: 06/20/2019 11:39   Ir Fluoro Guide Cv Line Right  Result Date: 06/24/2019 INDICATION: End-stage renal disease. In need of durable intravenous access for the initiation of hemodialysis. EXAM: TUNNELED CENTRAL VENOUS HEMODIALYSIS CATHETER PLACEMENT WITH ULTRASOUND AND FLUOROSCOPIC GUIDANCE MEDICATIONS: Ancef 2 gm IV . The antibiotic was given in an appropriate time interval prior to skin puncture. ANESTHESIA/SEDATION: Versed 1 mg IV; Fentanyl 50 mcg IV; Moderate Sedation Time:  13 minutes The patient was continuously monitored during the procedure by the interventional radiology nurse under my direct supervision. FLUOROSCOPY TIME:  42 seconds (60 mGy) COMPLICATIONS: None immediate. PROCEDURE: Informed written consent was obtained from the patient after a discussion of the risks, benefits, and alternatives to treatment. Questions regarding the procedure were encouraged and answered. The right neck and chest were prepped with chlorhexidine in a sterile fashion, and a sterile drape was applied covering the operative field. Maximum barrier sterile technique with sterile gowns and gloves were used for the procedure. A timeout was performed prior to the initiation of the procedure. After creating a small venotomy incision, a micropuncture kit was utilized to access the internal jugular vein. Real-time ultrasound guidance was utilized for vascular access including the acquisition of a permanent ultrasound image documenting patency of the accessed vessel. The microwire was utilized to measure appropriate catheter length. A stiff Glidewire was advanced to the level of the IVC and the micropuncture sheath was exchanged for a peel-away sheath. A palindrome tunneled hemodialysis catheter measuring 19 cm from tip to cuff was tunneled in a retrograde fashion from the anterior chest wall to the  venotomy incision. The catheter was then placed through the peel-away sheath with tips ultimately positioned within the superior aspect of the right atrium. Final catheter positioning was confirmed and documented with a spot radiographic image. The catheter aspirates and flushes normally. The catheter was flushed with appropriate volume heparin dwells. The catheter exit site was secured with a 0-Prolene retention suture. The venotomy incision was closed with Dermabond and Steri-strips. Dressings were applied. The patient tolerated the procedure well without immediate post procedural complication. IMPRESSION: Successful placement of 19 cm tip to cuff tunneled hemodialysis catheter via the right internal jugular vein with tips terminating within the superior aspect of the right atrium. The catheter is ready for immediate use. Electronically Signed   By: Sandi Mariscal M.D.   On: 06/24/2019 16:01   Ir US Guide Vasc Access Right  Result  Date: 06/24/2019 INDICATION: End-stage renal disease. In need of durable intravenous access for the initiation of hemodialysis. EXAM: TUNNELED CENTRAL VENOUS HEMODIALYSIS CATHETER PLACEMENT WITH ULTRASOUND AND FLUOROSCOPIC GUIDANCE MEDICATIONS: Ancef 2 gm IV . The antibiotic was given in an appropriate time interval prior to skin puncture. ANESTHESIA/SEDATION: Versed 1 mg IV; Fentanyl 50 mcg IV; Moderate Sedation Time:  13 minutes The patient was continuously monitored during the procedure by the interventional radiology nurse under my direct supervision. FLUOROSCOPY TIME:  42 seconds (60 mGy) COMPLICATIONS: None immediate. PROCEDURE: Informed written consent was obtained from the patient after a discussion of the risks, benefits, and alternatives to treatment. Questions regarding the procedure were encouraged and answered. The right neck and chest were prepped with chlorhexidine in a sterile fashion, and a sterile drape was applied covering the operative field. Maximum barrier sterile  technique with sterile gowns and gloves were used for the procedure. A timeout was performed prior to the initiation of the procedure. After creating a small venotomy incision, a micropuncture kit was utilized to access the internal jugular vein. Real-time ultrasound guidance was utilized for vascular access including the acquisition of a permanent ultrasound image documenting patency of the accessed vessel. The microwire was utilized to measure appropriate catheter length. A stiff Glidewire was advanced to the level of the IVC and the micropuncture sheath was exchanged for a peel-away sheath. A palindrome tunneled hemodialysis catheter measuring 19 cm from tip to cuff was tunneled in a retrograde fashion from the anterior chest wall to the venotomy incision. The catheter was then placed through the peel-away sheath with tips ultimately positioned within the superior aspect of the right atrium. Final catheter positioning was confirmed and documented with a spot radiographic image. The catheter aspirates and flushes normally. The catheter was flushed with appropriate volume heparin dwells. The catheter exit site was secured with a 0-Prolene retention suture. The venotomy incision was closed with Dermabond and Steri-strips. Dressings were applied. The patient tolerated the procedure well without immediate post procedural complication. IMPRESSION: Successful placement of 19 cm tip to cuff tunneled hemodialysis catheter via the right internal jugular vein with tips terminating within the superior aspect of the right atrium. The catheter is ready for immediate use. Electronically Signed   By: Sandi Mariscal M.D.   On: 06/24/2019 16:01   Vas [place] Upper Extremity Arterial Duplex  Result Date: 06/25/2019 UPPER EXTREMITY DUPLEX STUDY Indications: Pre AVF vs AVG right arm.  Comparison Study: no prior Performing Technologist: June Leap RDMS, RVT  Examination Guidelines: A complete evaluation includes B-mode imaging, spectral  Doppler, color Doppler, and power Doppler as needed of all accessible portions of each vessel. Bilateral testing is considered an integral part of a complete examination. Limited examinations for reoccurring indications may be performed as noted.  Right Doppler Findings: +----------+----------+---------+------+-------------------+ Site      PSV (cm/s)Waveform PlaqueComments            +----------+----------+---------+------+-------------------+ Subclavian104       triphasic                          +----------+----------+---------+------+-------------------+ Axillary  111       triphasic                          +----------+----------+---------+------+-------------------+ Brachial  153       triphasic      heterogenous plaque +----------+----------+---------+------+-------------------+ Radial    96  triphasic                          +----------+----------+---------+------+-------------------+ Ulnar     78        triphasic      heterogenous plaque +----------+----------+---------+------+-------------------+  Summary:  Right: No obstruction visualized in the right upper extremity. *See table(s) above for measurements and observations. Electronically signed by Deitra Mayo MD on 06/25/2019 at 6:00:39 PM.    Final    Vas [place] Lower Extremity Venous (dvt) (only Mc & Wl 7a-7p)  Result Date: 06/10/2019  Lower Venous Study Indications: Swelling, and bilateral lower extremity blisters.  Limitations: Body habitus, poor ultrasound/tissue interface and open wound. Comparison Study: bilateral lower extremity venous duplex 06/07/2018. Performing Technologist: Maudry Mayhew MHA, RDMS, RVT, RDCS  Examination Guidelines: A complete evaluation includes B-mode imaging, spectral Doppler, color Doppler, and power Doppler as needed of all accessible portions of each vessel. Bilateral testing is considered an integral part of a complete examination. Limited examinations for reoccurring  indications may be performed as noted.  +---------+---------------+---------+-----------+----------+--------------+ RIGHT    CompressibilityPhasicitySpontaneityPropertiesSummary        +---------+---------------+---------+-----------+----------+--------------+ CFV      Full                                                        +---------+---------------+---------+-----------+----------+--------------+ SFJ      Full                                                        +---------+---------------+---------+-----------+----------+--------------+ FV Prox  Full                                                        +---------+---------------+---------+-----------+----------+--------------+ FV Mid   Full                                                        +---------+---------------+---------+-----------+----------+--------------+ FV DistalFull                                                        +---------+---------------+---------+-----------+----------+--------------+ PFV      Full                                                        +---------+---------------+---------+-----------+----------+--------------+ POP      Full                                                        +---------+---------------+---------+-----------+----------+--------------+  PTV                                                   Not visualized +---------+---------------+---------+-----------+----------+--------------+ PERO                                                  Not visualized +---------+---------------+---------+-----------+----------+--------------+   +---------+---------------+---------+-----------+----------+-------+ LEFT     CompressibilityPhasicitySpontaneityPropertiesSummary +---------+---------------+---------+-----------+----------+-------+ CFV      Full           Yes      Yes                           +---------+---------------+---------+-----------+----------+-------+ SFJ      Full                                                 +---------+---------------+---------+-----------+----------+-------+ FV Prox  Full                                                 +---------+---------------+---------+-----------+----------+-------+ FV Mid   Full                                                 +---------+---------------+---------+-----------+----------+-------+ FV DistalFull                                                 +---------+---------------+---------+-----------+----------+-------+ PFV      Full                                                 +---------+---------------+---------+-----------+----------+-------+ POP      Full           Yes      Yes                          +---------+---------------+---------+-----------+----------+-------+ PTV      Full                                                 +---------+---------------+---------+-----------+----------+-------+ PERO     Full                                                 +---------+---------------+---------+-----------+----------+-------+  Summary: Right: There is no evidence of deep vein thrombosis in the lower extremity. However, portions of this examination were limited- see technologist comments above. No cystic structure found in the popliteal fossa. Left: There is no evidence of deep vein thrombosis in the lower extremity. However, portions of this examination were limited- see technologist comments above. No cystic structure found in the popliteal fossa.  *See table(s) above for measurements and observations. Electronically signed by Servando Snare MD on 06/10/2019 at 5:10:10 PM.    Final    Vas [place] Upper Ext Vein Mapping (pre-op Avf)  Result Date: 06/25/2019 UPPER EXTREMITY VEIN MAPPING  Indications: Pre AVF vs AVG right arm. Performing Technologist: June Leap RDMS, RVT  Examination  Guidelines: A complete evaluation includes B-mode imaging, spectral Doppler, color Doppler, and power Doppler as needed of all accessible portions of each vessel. Bilateral testing is considered an integral part of a complete examination. Limited examinations for reoccurring indications may be performed as noted. +-----------------+-------------+----------+---------+ Right Cephalic   Diameter (cm)Depth (cm)Findings  +-----------------+-------------+----------+---------+ Shoulder             0.21        0.82             +-----------------+-------------+----------+---------+ Mid upper arm        0.22        0.59   branching +-----------------+-------------+----------+---------+ Dist upper arm       0.29        0.54             +-----------------+-------------+----------+---------+ Antecubital fossa    0.53                         +-----------------+-------------+----------+---------+ Prox forearm         0.30        0.92   branching +-----------------+-------------+----------+---------+ Mid forearm          0.21        0.67             +-----------------+-------------+----------+---------+ Dist forearm         0.28        0.54             +-----------------+-------------+----------+---------+ +-----------------+-------------+----------+---------+ Right Basilic    Diameter (cm)Depth (cm)Findings  +-----------------+-------------+----------+---------+ Mid upper arm        0.48        1.92             +-----------------+-------------+----------+---------+ Dist upper arm       0.45        1.40             +-----------------+-------------+----------+---------+ Antecubital fossa    0.51                         +-----------------+-------------+----------+---------+ Prox forearm         0.47               branching +-----------------+-------------+----------+---------+ *See table(s) above for measurements and observations.  Diagnosing physician: Deitra Mayo MD Electronically signed by Deitra Mayo MD on 06/25/2019 at 6:00:47 PM.    Final       Subjective:  Patient seen and examined the bedside this morning.  Hemodynamically stable for discharge today.  Denies any complaints.  Remains comfortable. Discharge Exam: Vitals:   06/30/19 1030 06/30/19 1051  BP: 119/66 113/64  Pulse: 82 80  Resp: 16 18  Temp: 97.7  F (36.5 C)   SpO2: 98% 100%   Vitals:   06/30/19 0930 06/30/19 1000 06/30/19 1030 06/30/19 1051  BP: 123/72 118/67 119/66 113/64  Pulse: 77 78 82 80  Resp:   16 18  Temp:   97.7 F (36.5 C)   TempSrc:   Oral   SpO2:   98% 100%  Weight:   70 kg   Height:        General: Pt is alert, awake, not in acute distress Cardiovascular: RRR, S1/S2 +, no rubs, no gallops Temporary dialysis catheter on the right chest Respiratory: CTA bilaterally, no wheezing, no rhonchi Abdominal: Soft, NT, ND, bowel sounds + Extremities: no edema, no cyanosis    The results of significant diagnostics from this hospitalization (including imaging, microbiology, ancillary and laboratory) are listed below for reference.     Microbiology: Recent Results (from the past 240 hour(s))  MRSA PCR Screening     Status: None   Collection Time: 06/28/19 11:45 PM   Specimen: Nasal Mucosa; Nasopharyngeal  Result Value Ref Range Status   MRSA by PCR NEGATIVE NEGATIVE Final    Comment:        The GeneXpert MRSA Assay (FDA approved for NASAL specimens only), is one component of a comprehensive MRSA colonization surveillance program. It is not intended to diagnose MRSA infection nor to guide or monitor treatment for MRSA infections. Performed at [facility], [place] [address], [place], [place] [address]      Labs: BNP (last 3 results) Recent Labs    02/10/19 1715 06/09/19 1717  BNP 70.3 353.6*   Basic Metabolic Panel: Recent Labs  Lab 06/25/19 0619 06/26/19 2138 06/29/19 0754 06/29/19 0838 06/30/19 0623  NA 135 135  134* 131* 133*  K 4.6 4.0 4.5 4.3 4.6  CL 102 99 97*  --  98  CO2 20* 25 27  --  24  GLUCOSE 99 187* 104* 103* 104*  BUN 87* 27* 20  --  31*  CREATININE 4.41* 2.28* 2.38*  --  3.44*  CALCIUM 10.2 9.4 9.8  --  9.8   Liver Function Tests: No results for input(s): AST, ALT, ALKPHOS, BILITOT, PROT, ALBUMIN in the last 168 hours. No results for input(s): LIPASE, AMYLASE in the last 168 hours. No results for input(s): AMMONIA in the last 168 hours. CBC: Recent Labs  Lab 06/26/19 2138 06/27/19 0543 06/28/19 0836 06/29/19 0754 06/29/19 0838 06/30/19 0623 06/30/19 0708  WBC 9.3 8.6 11.2* 11.4*  --  10.0 10.3  NEUTROABS 7.0 5.6 7.9* 8.4*  --  7.1  --   HGB 9.0* 9.0* 9.5* 9.2* 9.2* 8.4* 8.2*  HCT 28.2* 28.4* 28.5* 28.8* 27.0* 27.3* 26.6*  MCV 92.2 91.6 89.9 92.0  --  93.2 95.0  PLT 267 268 287 242  --  226 227   Cardiac Enzymes: No results for input(s): CKTOTAL, CKMB, CKMBINDEX, TROPONINI in the last 168 hours. BNP: Invalid input(s): POCBNP CBG: Recent Labs  Lab 06/25/19 1634 06/25/19 2048 06/26/19 0700 06/26/19 1118 06/26/19 1724  GLUCAP 156* 102* 95 144* 109*   D-Dimer No results for input(s): DDIMER in the last 72 hours. Hgb A1c No results for input(s): HGBA1C in the last 72 hours. Lipid Profile No results for input(s): CHOL, HDL, LDLCALC, TRIG, CHOLHDL, LDLDIRECT in the last 72 hours. Thyroid function studies No results for input(s): TSH, T4TOTAL, T3FREE, THYROIDAB in the last 72 hours.  Invalid input(s): FREET3 Anemia work up [employer]    06/30/19 0708  FERRITIN 396*  TIBC 195*  IRON 40   Urinalysis    Component Value Date/Time   COLORURINE YELLOW 06/17/2019 1753   APPEARANCEUR HAZY (A) 06/17/2019 1753   LABSPEC 1.010 06/17/2019 1753   PHURINE 6.0 06/17/2019 1753   GLUCOSEU NEGATIVE 06/17/2019 1753   HGBUR SMALL (A) 06/17/2019 1753   BILIRUBINUR NEGATIVE 06/17/2019 1753   KETONESUR NEGATIVE 06/17/2019 1753   PROTEINUR 100 (A) 06/17/2019 1753    NITRITE NEGATIVE 06/17/2019 1753   LEUKOCYTESUR LARGE (A) 06/17/2019 1753   Sepsis Labs Invalid input(s): PROCALCITONIN,  WBC,  LACTICIDVEN Microbiology Recent Results (from the past 240 hour(s))  MRSA PCR Screening     Status: None   Collection Time: 06/28/19 11:45 PM   Specimen: Nasal Mucosa; Nasopharyngeal  Result Value Ref Range Status   MRSA by PCR NEGATIVE NEGATIVE Final    Comment:        The GeneXpert MRSA Assay (FDA approved for NASAL specimens only), is one component of a comprehensive MRSA colonization surveillance program. It is not intended to diagnose MRSA infection nor to guide or monitor treatment for MRSA infections. Performed at [facility], [place] [address] [place], [place] [address]     Please note: You were cared for by a hospitalist during your hospital stay. Once you are discharged, your primary care physician will handle any further medical issues. Please note that NO REFILLS for any discharge medications will be authorized once you are discharged, as it is imperative that you return to your primary care physician (or establish a relationship with a primary care physician if you do not have one) for your post hospital discharge needs so that they can reassess your need for medications and monitor your lab values.    Time coordinating discharge: 40 minutes  SIGNED:   Shelly Coss, MD  [facility] 06/30/2019, 3:59 PM Pager [phone]  If 7PM-7AM, please contact night-coverage [email or web address] Password [email or web address]

## 2019-06-30 NOTE — TOC Initial Note (Signed)
Transition of Care [facility]) - Initial/Assessment Note    Patient Details  Name: Erika Fry MRN: [id number] Date of Birth: 14-Apr-1946  [facility] CM/SW Contact:    Bartholomew Crews, RN Phone Number: [phone] 06/30/2019, 3:17 PM  Clinical Narrative:                 Spoke with patient at bedside. PTA patient home alone with son and daughter in law nearby. States that she has 3 sons and daughters in law who all live close, and can assist with care and transportation as needed.   No DME needs. Discussed that CIR states she is doing too well, so plan is home with [facility]. Patient states that she has used [facility] and wants [facility] and [place]. Referral accepted by [facility] for RN, PT, and OT. Will need HH orders with Face to Face - MD aware.   Patient stated that she called the dialysis center to discuss changing her chair time. States her chair time was changed to TTS 12:15 - needs to be there by 12. States that she was asked to be there by 11 on the first day to do paperwork. Spoke with renal navigator - nephrology ok with d/c today - needs to go to dialysis center tomorrow for treatment. Renal navigator to advise dialysis center. Patient notified and agreed. Patient states that family will provide transportation.   Patient requested dietitian consult. Placed. Advised bedside RN who will provide information.     Expected Discharge Plan: [place] Barriers to Discharge: No Barriers Identified   Patient Goals and CMS Choice Patient states their goals for this hospitalization and ongoing recovery are:: return home with home health CMS Medicare.gov Compare Post Acute Care list provided to:: Patient Choice offered to / list presented to : Patient  Expected Discharge Plan and Services Expected Discharge Plan: [place] In-house Referral: Clinical Social Work Discharge Planning Services: CM Consult Post Acute Care Choice: Home Health, Dialysis Living  arrangements for the past 2 months: [place] Expected Discharge Date: 06/30/19               DME Arranged: N/A DME Agency: NA       HH Arranged: RN, PT, OT HH Agency: [facility] [employer]) Date [place]: 06/30/19 Time [place]: [place] Representative spoke with at [place]: [place] Arrangements/Services Living arrangements for the past 2 months: [place] with:: Self Patient language and need for interpreter reviewed:: Yes Do you feel safe going back to the place where you live?: Yes      Need for Family Participation in Patient Care: Yes (Comment) Care giver support system in place?: Yes (comment) Current home services: DME Criminal Activity/Legal Involvement Pertinent to Current Situation/Hospitalization: No - Comment as needed  Activities of Daily Living Home Assistive Devices/Equipment: Eyeglasses, Gilford Rile (specify type) ADL Screening (condition at time of admission) Patient's cognitive ability adequate to safely complete daily activities?: No Is the patient deaf or have difficulty hearing?: No Does the patient have difficulty seeing, even when wearing glasses/contacts?: No Does the patient have difficulty concentrating, remembering, or making decisions?: No Patient able to express need for assistance with ADLs?: Yes Does the patient have difficulty dressing or bathing?: Yes Independently performs ADLs?: No Does the patient have difficulty walking or climbing stairs?: Yes Weakness of Legs: Both Weakness of Arms/Hands: None  Permission Sought/Granted Permission sought to share  information with : Family Supports Permission granted to share information with : Yes, Verbal Permission Granted  Share Information with NAME: Anderson [place] Tippet  Permission granted to share info w AGENCY: [facility] granted to share info w Relationship: daughter in law  Permission granted to share info w  Contact Information: [phone]  Emotional Assessment Appearance:: Appears stated age Attitude/Demeanor/Rapport: Engaged Affect (typically observed): Accepting Orientation: : Oriented to Situation, Oriented to  Time, Oriented to Place, Oriented to Self Alcohol / Substance Use: Not Applicable Psych Involvement: No (comment)  Admission diagnosis:  Leg pain Patient Active Problem List   Diagnosis Date Noted  . ESRD (end stage renal disease) on dialysis ([place])   . Pain in both lower extremities   . Hypomagnesemia 06/11/2019  . DNR (do not resuscitate) discussion   . Cystitis   . Acute on chronic diastolic CHF (congestive heart failure) ([place]) 06/10/2019  . Chronic diastolic CHF (congestive heart failure) ([place]) 06/09/2019  . Cellulitis of lower extremity 06/09/2019  . Bladder pain   . Hyponatremia   . Acute on chronic anemia   . Diabetes mellitus type 2 in obese ([place])   . Chronic kidney disease   . Drug induced constipation   . Neurogenic bladder   . Weakness generalized 04/21/2019  . Acquired hydronephrosis with ureteropelvic junction (UPJ) obstruction   . Pain in joint involving pelvic region and thigh   . Dysuria   . Palliative care by specialist   . AKI (acute kidney injury) ([place]) 04/10/2019  . Hydronephrosis 04/09/2019  . Interstitial cystitis 04/09/2019  . Bilateral leg edema 02/25/2019  . Chronic heart failure with preserved ejection fraction ([place]) 02/24/2019  . Presence of permanent cardiac pacemaker 02/24/2019  . ARF (acute renal failure) ([place]) 02/10/2019  . Peripheral edema   . Fever 01/17/2019  . Acute lower UTI 01/17/2019  . Pressure injury of skin 01/17/2019  . Gross hematuria 01/14/2019  . Fluid retention   . Generalized edema   . Hypokalemia   . Acute right-sided low back pain with right-sided sciatica   . Acute blood loss anemia   . Anemia of chronic disease   . Thrombocytopenia ([place])   . Diabetes mellitus ([place])   . Morbid obesity ([place])   . Benign  essential HTN   . Hypoalbuminemia due to protein-calorie malnutrition ([place])   . Occult blood in stools   . Gastric polyp   . Lumbar discitis 05/17/2018  . Blood loss anemia 05/12/2018  . Hyperlipidemia associated with type 2 diabetes mellitus ([place]) 05/10/2018  . UTI (urinary tract infection) 04/30/2018  . Essential hypertension 04/29/2018  . Chronic kidney disease (CKD), stage IV (severe) ([place])   . Diet-controlled diabetes mellitus ([place])   . GERD (gastroesophageal reflux disease)   . Gout   . High cholesterol   . Hypothyroidism   . Iron deficiency anemia   . AV block, Mobitz 2 03/25/2018  . Mobitz type 2 second degree AV block 03/20/2018  . Aortic stenosis 03/20/2018  . Exertional dyspnea 03/19/2018   PCP:  [name], MD Pharmacy:   [facility], [address] [place] [address] Phone: [phone] Fax: [phone]     Social Determinants of Health (SDOH) Interventions    Readmission Risk Interventions Readmission Risk Prevention Plan 06/15/2019 04/13/2019  Transportation Screening Complete Complete  Medication Review (RN Care Manager) Complete Complete  PCP or Specialist appointment within 3-5 days of discharge Not Complete Complete  PCP/Specialist Appt Not Complete comments not ready for dc -  HRI or Home Care Consult Complete Complete  SW Recovery Care/Counseling Consult Complete Complete  Palliative Care Screening Not Applicable Not [place] Not Applicable Patient Refused  Some recent data might be hidden

## 2019-06-30 NOTE — Progress Notes (Signed)
Inpatient Rehabilitation-Admissions Coordinator   Based on PT progress note on 7/28, pt is progressing well and is at a level where she no longer requires IP Rehab. Pt is able to transfer at Supervision level and ambulate with Min G. AC will sign off. CM notified of need for new dispo plans.   Please call if questions.   [name], OTR/L  Rehab Admissions Coordinator  [phone] 06/30/2019 10:35 AM

## 2019-06-30 NOTE — Progress Notes (Signed)
  Progress Note    06/30/2019 11:50 AM 1 Day Post-Op  Subjective: Doing well currently on dialysis  Vitals:   06/30/19 1030 06/30/19 1051  BP: 119/66 113/64  Pulse: 82 80  Resp: 16 18  Temp: 97.7 F (36.5 C)   SpO2: 98% 100%    Physical Exam: Awake alert and oriented Right arm with hematoma near antecubitum and palpable thrill in the upper arm  CBC    Component Value Date/Time   WBC 10.3 06/30/2019 0708   RBC 2.80 (L) 06/30/2019 0708   HGB 8.2 (L) 06/30/2019 0708   HCT 26.6 (L) 06/30/2019 0708   HCT 20.1 (L) 05/13/2018 0427   PLT 227 06/30/2019 0708   MCV 95.0 06/30/2019 0708   MCH 29.3 06/30/2019 0708   MCHC 30.8 06/30/2019 0708   RDW 14.1 06/30/2019 0708   LYMPHSABS 1.6 06/30/2019 0623   MONOABS 0.9 06/30/2019 0623   EOSABS 0.2 06/30/2019 0623   BASOSABS 0.1 06/30/2019 0623    BMET    Component Value Date/Time   NA 133 (L) 06/30/2019 0623   NA 134 07/02/2018 1409   K 4.6 06/30/2019 0623   CL 98 06/30/2019 0623   CO2 24 06/30/2019 0623   GLUCOSE 104 (H) 06/30/2019 0623   BUN 31 (H) 06/30/2019 0623   BUN 48 (H) 07/02/2018 1409   CREATININE 3.44 (H) 06/30/2019 0623   CREATININE 3.24 (H) 12/21/2018 1029   CALCIUM 9.8 06/30/2019 0623   CALCIUM 9.6 10/31/2017 1050   GFRNONAA 12 (L) 06/30/2019 0623   GFRAA 14 (L) 06/30/2019 0623    INR    Component Value Date/Time   INR 1.1 06/29/2019 0754     Intake/Output Summary (Last 24 hours) at 06/30/2019 1150 Last data filed at 06/30/2019 1030 Gross per 24 hour  Intake 480 ml  Output 3700 ml  Net -3220 ml     Assessment/plan:  73 y.o. female is s/p right for stage basilic vein fistula creation.  Follow-up has been set for September 11 we will get dialysis duplex and if looks good we will plan for second stage procedure at that time.  Brandon C. Donzetta Matters, MD [facility] [place] Office: [phone] Pager: [phone]  06/30/2019 11:50 AM

## 2019-06-30 NOTE — Progress Notes (Signed)
PT Cancellation Note  Patient Details Name: LILLEE MOONEYHAN MRN: [id number] DOB: [birth year]   Cancelled Treatment:    Reason Eval/Treat Not Completed: Patient at procedure or test/unavailable Currently in HD;  Will follow up later today as time allows;  Otherwise, will follow up for PT tomorrow;   Thank you,  Roney Marion, PT  Acute Rehabilitation Services Pager [phone] Office [phone]    Colletta [place] 06/30/2019, 8:05 AM

## 2019-06-30 NOTE — Procedures (Signed)
Patient seen on Hemodialysis. BP 123/72   Pulse 77   Temp 98.6 F (37 C) (Oral)   Resp 18   Ht 5\' 5"  (1.651 m)   Wt 73.3 kg   SpO2 98%   BMI 26.89 kg/m   QB 400, UF goal 3L Tolerating treatment without complaints at this time.   Erika Shiley MD [facility]. Office # [phone] Pager # [phone] 9:59 AM

## 2019-06-30 NOTE — Discharge Instructions (Signed)
° ° ° °  [facility]  Discharge Instructions  AV Fistula or Graft Surgery for Dialysis Access  Please refer to the following instructions for your post-procedure care. Your surgeon or physician assistant will discuss any changes with you.  Activity  You may drive the day following your surgery, if you are comfortable and no longer taking prescription pain medication. Resume full activity as the soreness in your incision resolves.  Bathing/Showering  You may shower after you go home. Keep your incision dry for 48 hours. Do not soak in a bathtub, hot tub, or swim until the incision heals completely. You may not shower if you have a hemodialysis catheter.  Incision Care  Clean your incision with mild soap and water after 48 hours. Pat the area dry with a clean towel. You do not need a bandage unless otherwise instructed. Do not apply any ointments or creams to your incision. You may have skin glue on your incision. Do not peel it off. It will come off on its own in about one week. Your arm may swell a bit after surgery. To reduce swelling use pillows to elevate your arm so it is above your heart. Your doctor will tell you if you need to lightly wrap your arm with an ACE bandage.  Diet  Resume your normal diet. There are not special food restrictions following this procedure. In order to heal from your surgery, it is CRITICAL to get adequate nutrition. Your body requires vitamins, minerals, and protein. Vegetables are the best source of vitamins and minerals. Vegetables also provide the perfect balance of protein. Processed food has little nutritional value, so try to avoid this.  Medications  Resume taking all of your medications. If your incision is causing pain, you may take over-the counter pain relievers such as acetaminophen (Tylenol). If you were prescribed a stronger pain medication, please be aware these medications can cause nausea and constipation.  Prevent nausea by taking the medication with a snack or meal. Avoid constipation by drinking plenty of fluids and eating foods with high amount of fiber, such as fruits, vegetables, and grains.  Do not take Tylenol if you are taking prescription pain medications.  Follow up Your surgeon may want to see you in the office following your access surgery. If so, this will be arranged at the time of your surgery.  Please call us immediately for any of the following conditions:  Increased pain, redness, drainage (pus) from your incision site Fever of 101 degrees or higher Severe or worsening pain at your incision site Hand pain or numbness.  Reduce your risk of vascular disease:  Stop smoking. If you would like help, call [facility] at 1-800-QUIT-NOW (305) 442-8272) or [place] [facility] [place] your cholesterol Maintain a desired weight Control your diabetes Keep your blood pressure down  Dialysis  It will take several weeks to several months for your new dialysis access to be ready for use. Your surgeon will determine when it is okay to use it. Your nephrologist will continue to direct your dialysis. You can continue to use your Permcath until your new access is ready for use.   06/30/2019 JEIRY BIRNBAUM [id number] [birth year]  Surgeon(s): Waynetta Sandy, MD  Procedure(s): Creation first stage basilic vein transposition   x Do not stick fistula for 12 weeks    If you have any questions, please call the office at [phone].

## 2019-06-30 NOTE — Progress Notes (Signed)
Patient ID: Erika Fry, female   DOB: 03-24-1946, 73 y.o.   MRN: [id number]  [facility] Progress Note   Assessment/ Plan:   1. ESRD: Newly started on dialysis with progressive underlying chronic kidney disease/difficulties with volume management.  Status post [facility] on 06/24/2019 and underwent first stage right brachiobasilic fistula creation by Dr. Donzetta Matters on 06/29/19 with plan for second stage in 6 weeks pending Doppler evaluation for maturity.  Continue MWF HD while here.  2. Anemia: Low iron saturation--start IV Fe and continue ESA. 3. CKD-MBD: Calcium and phosphorus level within acceptable range, follow-up on PTH.  Not on binder. 4. Nutrition: Continue renal diet with ONS/renal multivitamin. 5. Hypertension: Blood pressures mildly elevated postoperatively, continue to monitor with hemodialysis/ultrafiltration.  Subjective:   Reports some right arm post-op pain. Denies any chest pain/shortness of breath.    Objective:   BP 123/72   Pulse 77   Temp 98.6 F (37 C) (Oral)   Resp 18   Ht 5\' 5"  (1.651 m)   Wt 73.3 kg   SpO2 98%   BMI 26.89 kg/m   Physical Exam: Gen: Appears comfortable resting in dialysis CVS: Pulse regular rhythm, normal rate, S1 and S2 normal Resp: Clear to auscultation bilaterally, no rales/rhonchi.  Right IJ TDC Abd: Soft, flat, nontender, right percutaneous nephrostomy tube in situ. Ext: 1+ pitting bilateral lower extremity edema.  Legs wrapped.  Right BBF with audible bruit  Labs: BMET Recent Labs  Lab 06/25/19 0619 06/26/19 2138 06/29/19 0754 06/29/19 0838 06/30/19 0623  NA 135 135 134* 131* 133*  K 4.6 4.0 4.5 4.3 4.6  CL 102 99 97*  --  98  CO2 20* 25 27  --  24  GLUCOSE 99 187* 104* 103* 104*  BUN 87* 27* 20  --  31*  CREATININE 4.41* 2.28* 2.38*  --  3.44*  CALCIUM 10.2 9.4 9.8  --  9.8   CBC Recent Labs  Lab 06/27/19 0543 06/28/19 0836 06/29/19 0754 06/29/19 0838 06/30/19 0623 06/30/19 0708  WBC 8.6 11.2* 11.4*  --   10.0 10.3  NEUTROABS 5.6 7.9* 8.4*  --  7.1  --   HGB 9.0* 9.5* 9.2* 9.2* 8.4* 8.2*  HCT 28.4* 28.5* 28.8* 27.0* 27.3* 26.6*  MCV 91.6 89.9 92.0  --  93.2 95.0  PLT 268 287 242  --  226 227   Medications:    . Chlorhexidine Gluconate Cloth  6 each Topical Q0600  . Chlorhexidine Gluconate Cloth  6 each Topical Q0600  . docusate sodium  100 mg Oral BID  . feeding supplement (NEPRO CARB STEADY)  237 mL Oral Daily  . feeding supplement (PRO-STAT SUGAR FREE 64)  30 mL Oral Daily  . ferrous sulfate  325 mg Oral Q breakfast  . gabapentin  100 mg Oral TID  . heparin      . heparin  5,000 Units Subcutaneous Q8H  . levothyroxine  75 mcg Oral QAC breakfast  . loratadine  10 mg Oral Daily  . multivitamin  1 tablet Oral QHS  . pantoprazole  40 mg Oral Daily  . polyethylene glycol  17 g Oral BID  . senna  1 tablet Oral QHS  . simvastatin  40 mg Oral QHS   Elmarie Shiley, MD 06/30/2019, 9:52 AM

## 2019-06-30 NOTE — Progress Notes (Signed)
Occupational Therapy Treatment Patient Details Name: Erika Fry MRN: [id number] DOB: 04/24/1946 Today's Date: 06/30/2019    History of present illness 73 y.o. female admitted with bil LE edema, cellulitis with renal failure progression to ESRD with initial fistula placed 7/28, UTI. PMHx: HTN, HLD, diet-controlled diabetes, GERD, hypothyroidism, gout, pacemaker placement, iron deficiency anemia, CKD-4, CHF, peripheral neuropathy, and recent R nephrostomy drain placed 04/2019   OT comments  Pt progressing towards goals. Pt reports insurance denied CIR coverage, therefore focus on functional mobility and dynamic standing balance as needed for toileting. Pt completed bed mobility at Mod I level with use of hospital bed rails (has hospital bed at home). Completed sit > stand and stand pivot transfers with RW with supervision.  Provided close supervision during hygiene and pulling pants over hips post toileting due to reports of mild dizziness.  Discussed home setup and family support upon d/c, recommending 24 hr supervision initially during transition to home with pt stating daughter in law does not work and can provide supervision as long as needed.  Pt will continue to benefit from OT acutely and HHOT upon d/c to continue to address activity tolerance and self-care.  Follow Up Recommendations  Home health OT;Supervision/Assistance - 24 hour    Equipment Recommendations  None recommended by OT       Precautions / Restrictions Precautions Precautions: Fall Precaution Comments: nephrostomy, sore sacrum Restrictions Weight Bearing Restrictions: No       Mobility Bed Mobility Overal bed mobility: Modified Independent Bed Mobility: Sit to Supine;Supine to Sit     Supine to sit: Modified independent (Device/Increase time) Sit to supine: Modified independent (Device/Increase time)   General bed mobility comments: pt able to transition to EOB with HOB 15 degrees with reliance on rail, no  assist to return to supine. Pt reports has hospital bed and BSC at home.  Transfers Overall transfer level: Needs assistance Equipment used: Rolling walker (2 wheeled) Transfers: Sit to/from [facility] Sit to Stand: Supervision Stand pivot transfers: Supervision       General transfer comment: increased time due to dizziness, but able to monitor and sit safety to allow it to pass        ADL either performed or assessed with clinical judgement   ADL Overall ADL's : Needs assistance/impaired     Grooming: Wash/dry hands;Sitting;Set up       Lower Body Bathing: Supervison/ safety;Sit to/from stand           Toilet Transfer: Supervision/safety;Stand-pivot;BSC;RW   Toileting- Water quality scientist and Hygiene: Supervision/safety;Sit to/from stand       Functional mobility during ADLs: Supervision/safety;Rolling walker General ADL Comments: Pt completed stand pivot transfer to toilet with RW with supervision, completed hygiene post BM with close supervision.  Pt reports mild dizziness, but able to sit and have it pass.               Cognition   Behavior During Therapy: WFL for tasks assessed/performed Overall Cognitive Status: Within Functional Limits for tasks assessed                                                     Pertinent Vitals/ Pain       Pain Assessment: Faces Faces Pain Scale: Hurts even more Pain Location: sacrum Pain Descriptors / Indicators: Sore;Tender;Aching Pain Intervention(s): Monitored  during session;Limited activity within patient's tolerance;Repositioned         Frequency  Min 2X/week        Progress Toward Goals  OT Goals(current goals can now be found in the care plan section)  Progress towards OT goals: Progressing toward goals  Acute Rehab OT Goals Patient Stated Goal: less pain OT Goal Formulation: With patient Time For Goal Achievement: 07/02/19 Potential to Achieve Goals: Good   Plan Discharge plan needs to be updated       AM-PAC OT "6 Clicks" Daily Activity     Outcome Measure   Help from another person eating meals?: None Help from another person taking care of personal grooming?: A Little Help from another person toileting, which includes using toliet, bedpan, or urinal?: A Little Help from another person bathing (including washing, rinsing, drying)?: A Little Help from another person to put on and taking off regular upper body clothing?: A Little Help from another person to put on and taking off regular lower body clothing?: A Little 6 Click Score: 19    End of Session Equipment Utilized During Treatment: Gait belt;Rolling walker  OT Visit Diagnosis: Pain;Unsteadiness on feet (R26.81) Pain - part of body: Leg   Activity Tolerance Patient tolerated treatment well;Patient limited by fatigue   Patient Left in bed;with call bell/phone within reach;with bed alarm set   Nurse Communication [facility])        Time: 7588-3254 OT Time Calculation (min): 26 min  Charges: OT General Charges $OT Visit: 1 Visit OT Treatments $Self Care/Home Management : 23-37 mins    Simonne Come, [phone] 06/30/2019, 2:36 PM

## 2019-07-01 DIAGNOSIS — E441 Mild protein-calorie malnutrition: Secondary | ICD-10-CM | POA: Insufficient documentation

## 2019-07-05 ENCOUNTER — Encounter (HOSPITAL_COMMUNITY)

## 2019-07-09 ENCOUNTER — Other Ambulatory Visit (HOSPITAL_COMMUNITY)

## 2019-07-14 IMAGING — CT CT ABD-PELV W/O CM
2 of 4 series · 15 of 46 positions shown, 17 images · non-contrast
Comparison: Pelvis and lumbar spine CT 04/29/2018

CLINICAL DATA: Anemia, unexplained, asymptomatic. Right lower back
pain. Weakness. Decreased appetite.

EXAM:
CT ABDOMEN AND PELVIS WITHOUT CONTRAST
TECHNIQUE: Multidetector CT imaging of the abdomen and pelvis was performed
following the standard protocol without IV contrast.

[Series 3: a/p w/o 5mm · axial · non-contrast · 0.74mm/px · z∈[+909,+1314]mm · 12 of 97 slices shown, 14 images]
[im 8/97  soft-tissue]
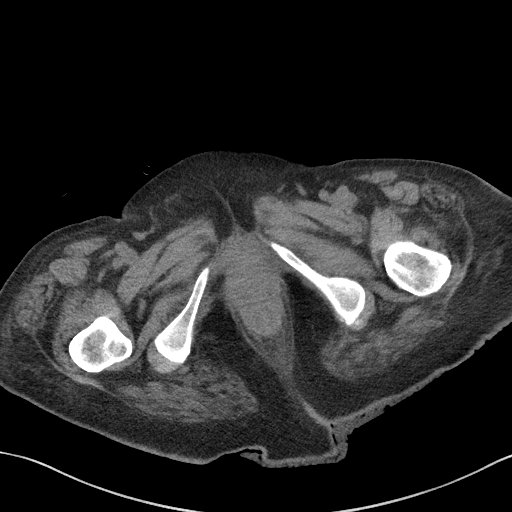
[im 8/97  bone]
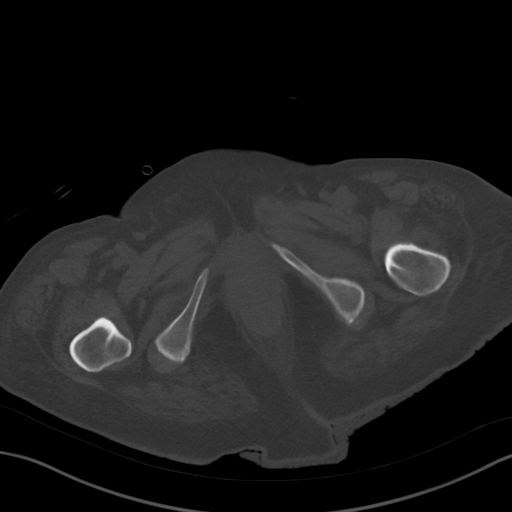
[im 15/97  soft-tissue]
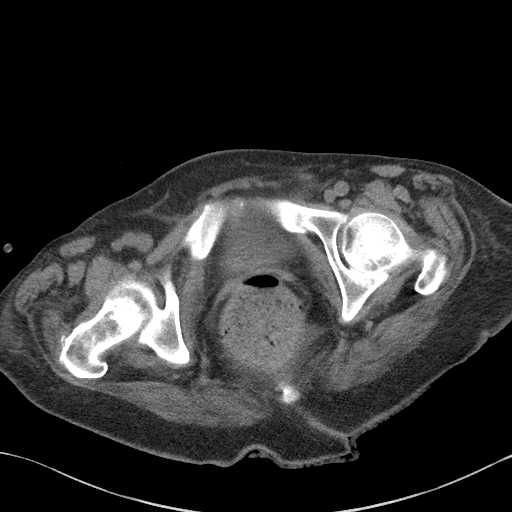
[im 23/97  soft-tissue]
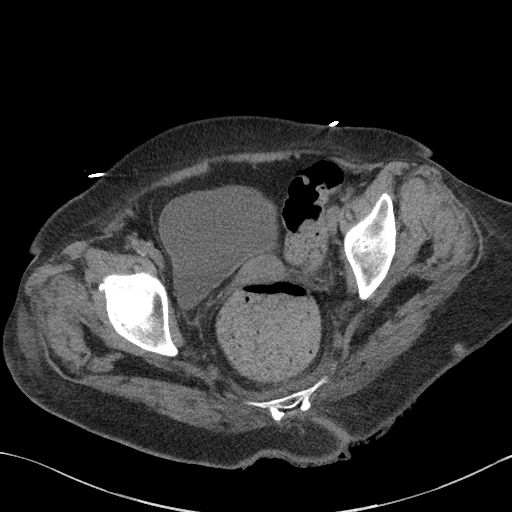
[im 30/97  soft-tissue]
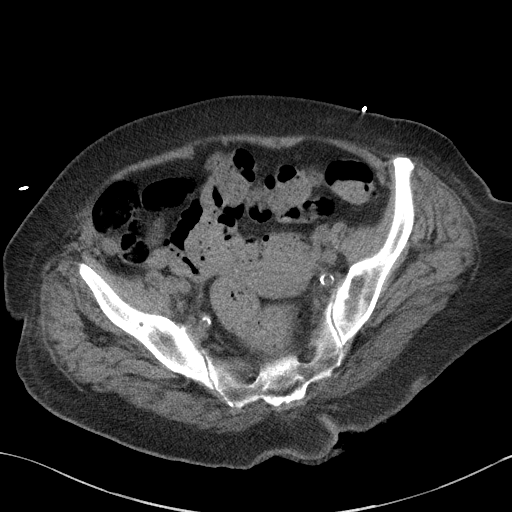
[im 37/97  soft-tissue]
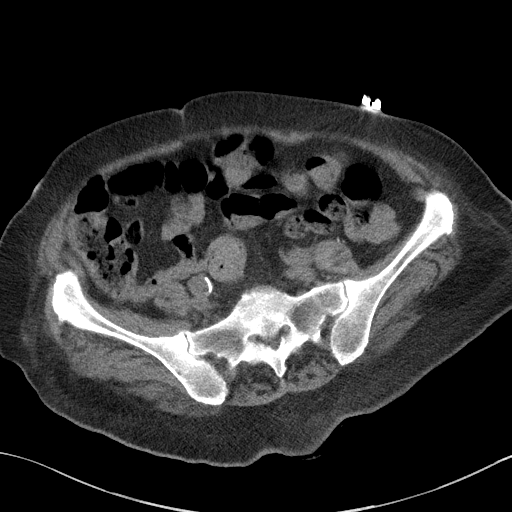
[im 45/97  soft-tissue]
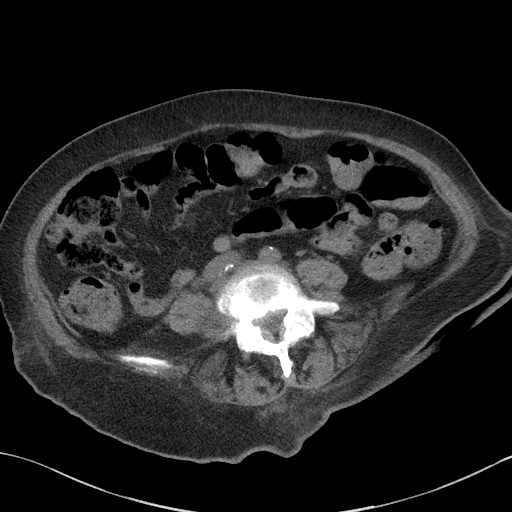
[im 52/97  soft-tissue]
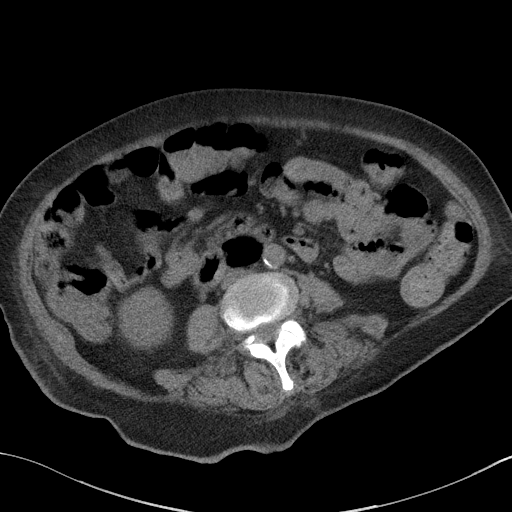
[im 60/97  soft-tissue]
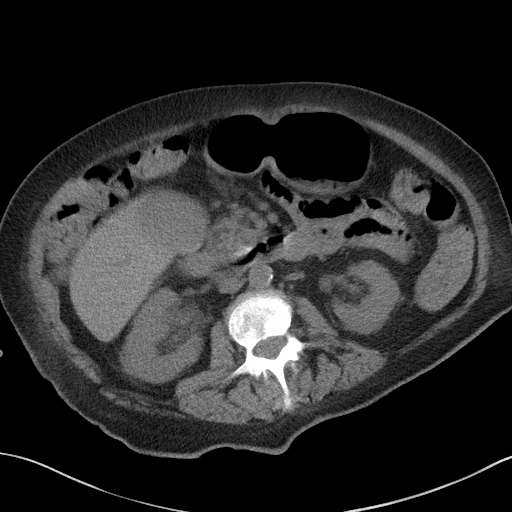
[im 67/97  soft-tissue]
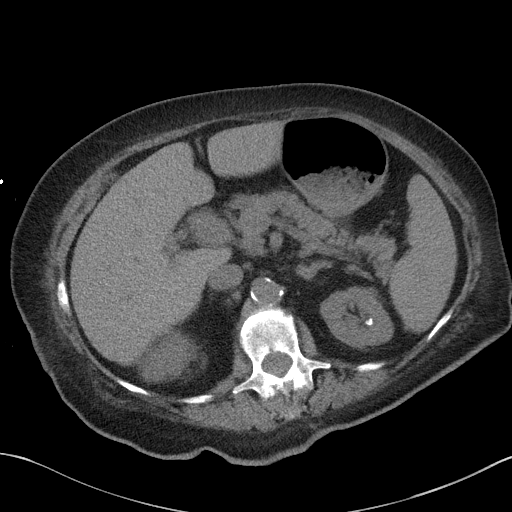
[im 67/97  bone]
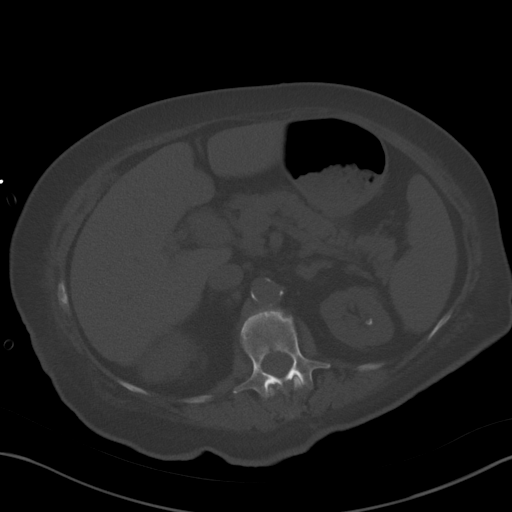
[im 74/97  soft-tissue]
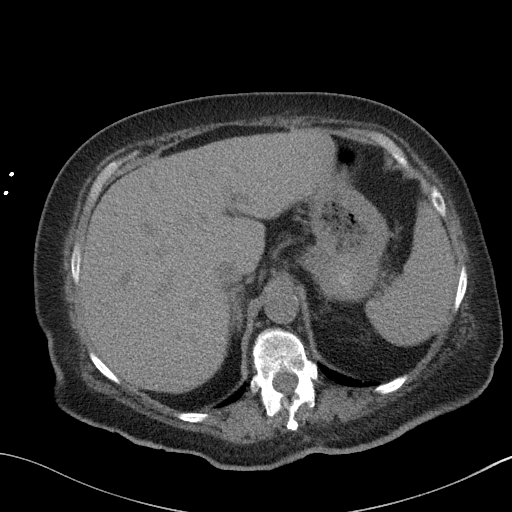
[im 82/97  soft-tissue]
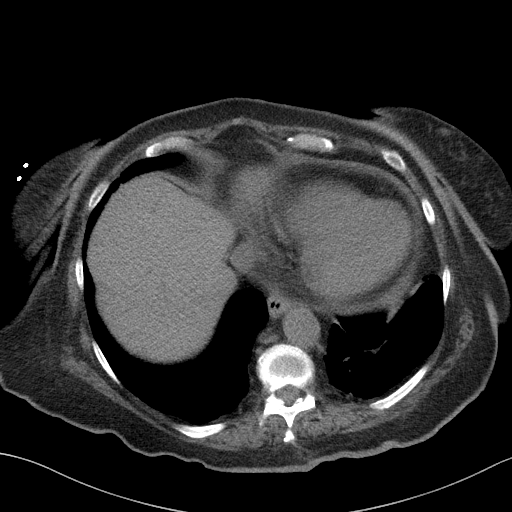
[im 89/97  soft-tissue]
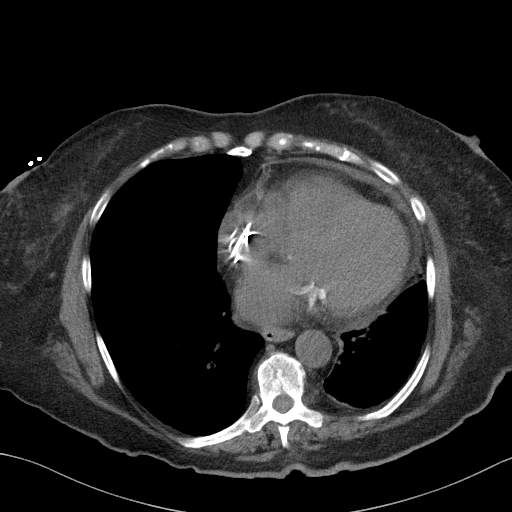

[Series 6: a/p w/o cor · coronal · non-contrast · 0.78mm/px · 3 of 144 slices shown]
[im 48/144  soft-tissue]
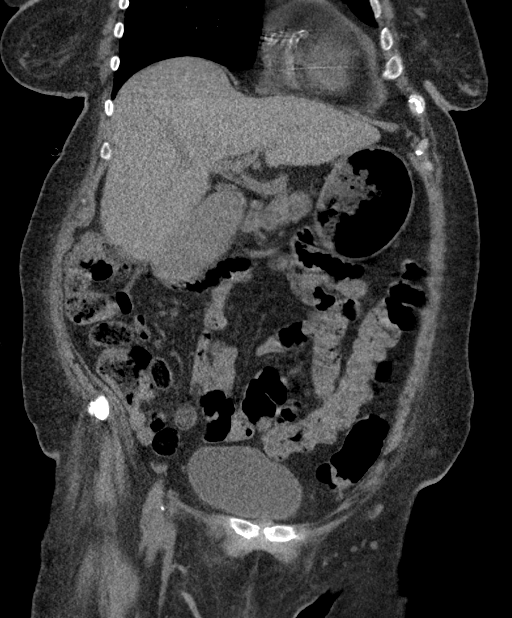
[im 64/144  soft-tissue]
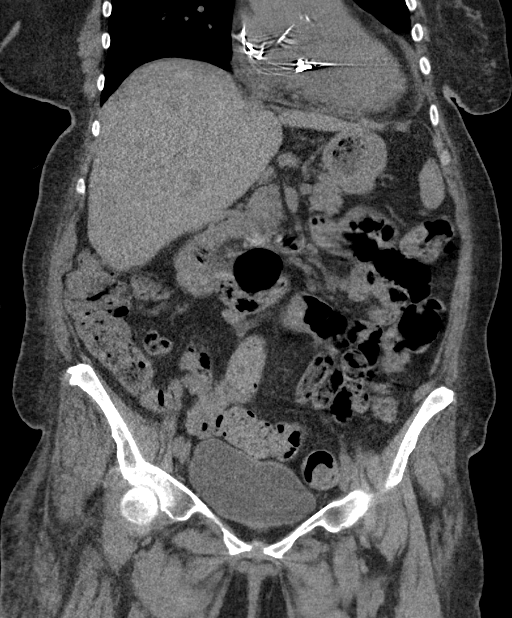
[im 80/144  soft-tissue]
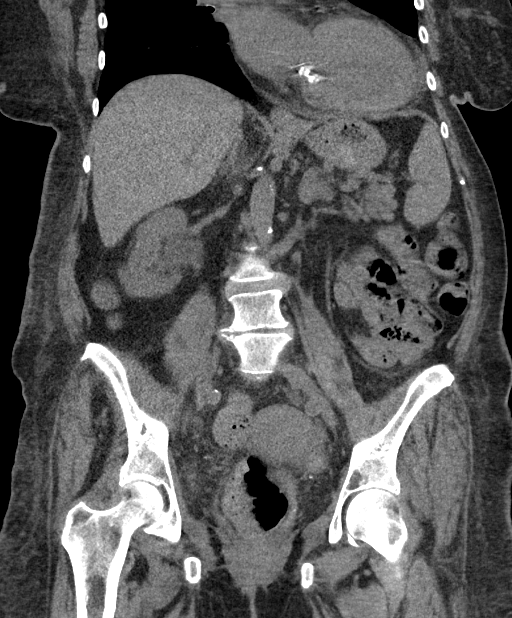

[15 of 46 positions shown; findings below may reference images not displayed]

FINDINGS: Lower chest: Pacemaker partially included. There are coronary artery
calcifications. Small circumferential pericardial effusion.

Hepatobiliary: No focal hepatic lesion on noncontrast exam. Mild
gallbladder distention containing layering high-density material. No
pericholecystic stranding allowing for motion artifact. No biliary
dilatation.

Pancreas: No ductal dilatation or inflammation. Motion artifact
partially obscures evaluation of the head and uncinate process.

Spleen: Normal in size.  No focal abnormality on noncontrast exam.

Adrenals/Urinary Tract: No adrenal nodule. Moderate hydronephrosis
without ureteral dilatation. There is an 8 mm stone in the right
renal pelvis. No ureteral calculi. No left hydronephrosis.
Nonobstructing 6 mm stone in the upper left kidney. Subcentimeter
low-density in the upper left kidney is too small to characterize
but may be cyst or angiomyolipoma. Urinary bladder is partially
distended without wall thickening or stone.

Stomach/Bowel: Small hiatal hernia. Stomach physiologically
distended. Mild motion artifact through the upper abdomen limits
evaluation of abdominal bowel loops. Allowing for this, no evidence
of inflammation, obstruction or bowel wall thickening. Appendix not
visualized, surgically absent per history. Moderate diffuse colonic
stool burden transverse colonic tortuosity. Rectum is distended with
stool measuring 7.9 cm. No definite rectal wall thickening, there is
mild perirectal and presacral edema.

Vascular/Lymphatic: Aortic atherosclerosis and tortuosity without
aneurysm. No enlarged abdominal or pelvic lymph nodes allowing for
noncontrast exam motion artifact in the abdomen.

Reproductive: Uterus is atrophic, normal for age.  No adnexal mass.

Other: Tiny fat containing umbilical hernia. No free air, free
fluid, or intra-abdominal fluid collection.

Musculoskeletal: Degenerative disc disease at L4-L5. There are no
acute or suspicious osseous abnormalities.
IMPRESSION: 1. Mild right hydronephrosis with a nonobstructing 8 mm stone in the
right kidney. No ureteral calculi or ureteral dilatation. Cause of
hydronephrosis is not identified. Nonobstructing left renal stone
without left hydronephrosis.
2. Moderate colonic stool burden with colonic tortuosity consistent
with constipation. There is fecal impaction with rectal distention
and perirectal edema.
3. Layering hyperdense gallbladder contents likely combination of
stones and sludge.
4.  Aortic Atherosclerosis ([id number]).

## 2019-07-19 ENCOUNTER — Ambulatory Visit (HOSPITAL_COMMUNITY)
Admission: RE | Admit: 2019-07-19 | Discharge: 2019-07-19 | Disposition: A | Discharge status: Home / Self Care | Payer: Medicare Other | Source: Ambulatory Visit | Attending: Interventional Radiology | Admitting: Interventional Radiology

## 2019-07-19 ENCOUNTER — Encounter (HOSPITAL_COMMUNITY): Payer: Self-pay | Admitting: Interventional Radiology

## 2019-07-19 ENCOUNTER — Other Ambulatory Visit: Payer: Self-pay

## 2019-07-19 ENCOUNTER — Other Ambulatory Visit (HOSPITAL_COMMUNITY): Payer: Self-pay | Admitting: Interventional Radiology

## 2019-07-19 DIAGNOSIS — N130 Hydronephrosis with ureteropelvic junction obstruction: Secondary | ICD-10-CM | POA: Insufficient documentation

## 2019-07-19 DIAGNOSIS — Z436 Encounter for attention to other artificial openings of urinary tract: Secondary | ICD-10-CM | POA: Insufficient documentation

## 2019-07-19 HISTORY — PX: IR NEPHROSTOMY EXCHANGE RIGHT: IMG6070

## 2019-07-19 MED ORDER — IOHEXOL 300 MG/ML  SOLN
50.0000 mL | Freq: Once | INTRAMUSCULAR | Status: AC | PRN
  Administered 2019-07-19: 10 mL

## 2019-07-19 MED ORDER — LIDOCAINE HCL 1 % IJ SOLN
INTRAMUSCULAR | Status: AC
  Filled 2019-07-19: qty 20

## 2019-07-19 MED ORDER — LIDOCAINE HCL 1 % IJ SOLN
INTRAMUSCULAR | Status: AC | PRN
  Administered 2019-07-19: 5 mL

## 2019-07-26 ENCOUNTER — Ambulatory Visit (HOSPITAL_COMMUNITY): Admission: RE | Admit: 2019-07-26 | Payer: Medicare Other | Source: Ambulatory Visit

## 2019-07-26 ENCOUNTER — Encounter (HOSPITAL_COMMUNITY): Payer: Self-pay

## 2019-08-12 ENCOUNTER — Telehealth (HOSPITAL_COMMUNITY): Payer: Self-pay

## 2019-08-12 NOTE — Telephone Encounter (Signed)

## 2019-08-13 ENCOUNTER — Ambulatory Visit (HOSPITAL_COMMUNITY)
Admission: RE | Admit: 2019-08-13 | Discharge: 2019-08-13 | Disposition: A | Discharge status: Home / Self Care | Source: Ambulatory Visit | Attending: Vascular Surgery | Admitting: Vascular Surgery

## 2019-08-13 ENCOUNTER — Encounter: Payer: Self-pay | Admitting: *Deleted

## 2019-08-13 ENCOUNTER — Other Ambulatory Visit: Payer: Self-pay

## 2019-08-13 ENCOUNTER — Ambulatory Visit (INDEPENDENT_AMBULATORY_CARE_PROVIDER_SITE_OTHER): Payer: Self-pay | Admitting: Physician Assistant

## 2019-08-13 ENCOUNTER — Other Ambulatory Visit: Payer: Self-pay | Admitting: *Deleted

## 2019-08-13 VITALS — BP 123/68 | HR 72 | Temp 98.1°F | Resp 20 | Ht 65.0 in | Wt 154.0 lb

## 2019-08-13 DIAGNOSIS — N184 Chronic kidney disease, stage 4 (severe): Secondary | ICD-10-CM | POA: Diagnosis present

## 2019-08-13 DIAGNOSIS — Z992 Dependence on renal dialysis: Secondary | ICD-10-CM

## 2019-08-13 DIAGNOSIS — N186 End stage renal disease: Secondary | ICD-10-CM

## 2019-08-13 NOTE — Progress Notes (Signed)
POST OPERATIVE OFFICE NOTE    CC:  F/u for surgery  HPI:  This is a 73 y.o. female who is s/p Right arm brachial artery to basilic vein AV fistula creation on 06/29/2019.  She denise loss of sensation and motor.  She is currently on HD via right TDC TTS.  Allergies  Allergen Reactions  . Baclofen Other (See Comments)    somnolence  . Codeine Other (See Comments)    Increases Pain and couldn't sleep    Current Outpatient Medications  Medication Sig Dispense Refill  . acetaminophen (TYLENOL) 325 MG tablet Take 1-2 tablets (325-650 mg total) by mouth every 4 (four) hours as needed for mild pain.    . Biotin 5 MG CAPS Take 1 capsule (5 mg total) by mouth daily. (Patient not taking: Reported on 06/09/2019)  0  . cetirizine (ZYRTEC) 10 MG tablet Take 10 mg by mouth at bedtime.     . docusate sodium (COLACE) 100 MG capsule Take 1 capsule (100 mg total) by mouth 2 (two) times daily. (Patient taking differently: Take 100 mg by mouth 2 (two) times daily as needed for mild constipation. ) 60 capsule 0  . ferrous sulfate 325 (65 FE) MG tablet Take 1 tablet (325 mg total) by mouth 2 (two) times daily with a meal. 60 tablet 0  . flavoxATE (URISPAS) 100 MG tablet Take 1 tablet (100 mg total) by mouth 3 (three) times daily as needed (bladder spasms, dysuria). 30 tablet 0  . fluticasone (FLONASE) 50 MCG/ACT nasal spray Place 2 sprays into both nostrils daily as needed for allergies or rhinitis.    [name] Kitchen gabapentin (NEURONTIN) 100 MG capsule Take 1 capsule (100 mg total) by mouth 3 (three) times daily. (Patient taking differently: Take 100 mg by mouth daily as needed (pain). ) 90 capsule 1  . levothyroxine (SYNTHROID) 75 MCG tablet Take 1 tablet (75 mcg total) by mouth daily before breakfast. 30 tablet 1  . Menthol-Methyl Salicylate (MUSCLE RUB) 10-15 % CREA Apply 1 application topically every 4 (four) hours as needed for muscle pain. (Patient not taking: Reported on 06/09/2019)  0  . midodrine (PROAMATINE) 5 MG  tablet Take 1 tablet (5 mg total) by mouth daily. 30 tablet 0  . Multiple Vitamin (MULTI-VITAMIN PO) Take 1 tablet by mouth daily.    [name] Kitchen oxyCODONE (OXY IR/ROXICODONE) 5 MG immediate release tablet Take 1 tablet (5 mg total) by mouth every 6 (six) hours as needed for severe pain. 15 tablet 0  . pantoprazole (PROTONIX) 40 MG tablet Take 40 mg by mouth 2 (two) times daily.    . polyethylene glycol (MIRALAX / GLYCOLAX) 17 g packet Take 17 g by mouth 2 (two) times daily. (Patient taking differently: Take 17 g by mouth daily as needed for mild constipation. ) 60 each 0  . simvastatin (ZOCOR) 40 MG tablet Take 1 tablet (40 mg total) by mouth at bedtime.     No current facility-administered medications for this visit.      ROS:  See HPI  Physical Exam:  +------------+----------+-------------+----------+------------------------+ OUTFLOW VEINPSV (cm/s)Diameter (cm)Depth (cm)        Describe         +------------+----------+-------------+----------+------------------------+ Prox UA         80        0.85        0.81                            +------------+----------+-------------+----------+------------------------+  Mid UA         157        0.77        1.00   competing branch 0.326cm +------------+----------+-------------+----------+------------------------+ Dist UA        197        0.75        0.88                            +------------+----------+-------------+----------+------------------------+ AC Fossa       285        0.58        0.68                            +------------+----------+-------------+----------+------------------------+   Incision:  Well healed  Extremities:  Palpable thrill in the fistula, palpable radial pulse, grip 5/5 and sensation intact.   Assessment/Plan:  This is a 73 y.o. female who is s/p:Right arm brachial artery to basilic vein AV fistula creation   The fistula has matured well and she will be scheduled for second stage  basilic transposition.     Roxy Horseman PA-C [facility] [phone]  Clinic MD:  Donzetta Matters

## 2019-08-30 ENCOUNTER — Other Ambulatory Visit (HOSPITAL_COMMUNITY)

## 2019-09-06 ENCOUNTER — Encounter (HOSPITAL_COMMUNITY): Payer: Self-pay | Admitting: *Deleted

## 2019-09-06 ENCOUNTER — Other Ambulatory Visit (HOSPITAL_COMMUNITY)
Admission: RE | Admit: 2019-09-06 | Discharge: 2019-09-06 | Disposition: A | Discharge status: Home / Self Care | Payer: Medicare Other | Source: Ambulatory Visit | Attending: Vascular Surgery | Admitting: Vascular Surgery

## 2019-09-06 DIAGNOSIS — Z20828 Contact with and (suspected) exposure to other viral communicable diseases: Secondary | ICD-10-CM | POA: Diagnosis not present

## 2019-09-06 DIAGNOSIS — Z01818 Encounter for other preprocedural examination: Secondary | ICD-10-CM | POA: Insufficient documentation

## 2019-09-06 LAB — SARS CORONAVIRUS 2 (TAT 6-24 HRS): SARS Coronavirus 2: NEGATIVE

## 2019-09-06 NOTE — Progress Notes (Signed)
Denies Chest pain, [facility] is her cardiologist, and states her shob is at baseline. States she does not check her blood sugar.

## 2019-09-07 ENCOUNTER — Encounter: Payer: Self-pay | Admitting: Cardiology

## 2019-09-07 DIAGNOSIS — Z45018 Encounter for adjustment and management of other part of cardiac pacemaker: Secondary | ICD-10-CM

## 2019-09-07 HISTORY — DX: Encounter for adjustment and management of other part of cardiac pacemaker: Z45.018

## 2019-09-07 NOTE — Anesthesia Preprocedure Evaluation (Addendum)
Anesthesia Evaluation  Patient identified by MRN, date of birth, ID band Patient awake    Reviewed: Allergy & Precautions, NPO status , Patient's Chart, lab work & pertinent test results  Airway Mallampati: II  TM Distance: >3 FB Neck ROM: Full    Dental  (+) Teeth Intact, Dental Advisory Given   Pulmonary    breath sounds clear to auscultation       Cardiovascular hypertension,  Rhythm:Regular Rate:Normal     Neuro/Psych    GI/Hepatic   Endo/Other  diabetes  Renal/GU      Musculoskeletal   Abdominal   Peds  Hematology   Anesthesia Other Findings   Reproductive/Obstetrics                            Anesthesia Physical Anesthesia Plan  ASA: III  Anesthesia Plan: General   Post-op Pain Management:    Induction: Intravenous  PONV Risk Score and Plan: Ondansetron  Airway Management Planned: LMA  Additional Equipment:   Intra-op Plan:   Post-operative Plan:   Informed Consent: I have reviewed the patients History and Physical, chart, labs and discussed the procedure including the risks, benefits and alternatives for the proposed anesthesia with the patient or authorized representative who has indicated his/her understanding and acceptance.     Dental advisory given  Plan Discussed with: CRNA and Anesthesiologist  Anesthesia Plan Comments: (S/p recent Right arm brachial artery to basilic vein AV fistula creation on 06/29/2019.  She is currently on HD via right TDC TTS.  Follows with Dr. Einar Gip for management of diastolic HF with severe dependent edema. Echo 3/20 with EF of 55 to 60%, impaired LV relaxation, no significant valvular abnormalities. She is also s/p Medtronic dual chamber pacemaker placed 03/2018 for symptomatic high grade AV block.  TTE 02/16/19:  1. The left ventricle has normal systolic function, with an ejection fraction of 55-60%. The cavity size was normal. There  is moderately increased left ventricular wall thickness. Left ventricular diastolic Doppler parameters are consistent with  impaired relaxation. Elevated left atrial and left ventricular end-diastolic pressures The E/e' is >15. No evidence of left ventricular regional wall motion abnormalities.  2. The right ventricle has normal systolic function. The cavity was normal. There is no increase in right ventricular wall thickness.  3. Left atrial size was severely dilated.  4. Right atrial size was mildly dilated.  5. The mitral valve is degenerative. Mild thickening of the mitral valve leaflet. There is mild to moderate mitral annular calcification present.  6. The aortic valve is tricuspid Mild sclerosis of the aortic valve. Aortic valve regurgitation was not assessed by color flow Doppler.  7. The inferior vena cava was dilated in size with >50% respiratory variability.  8. Small pericardial effusion.  9. The pericardial effusion is circumferential. 10. When compared to the prior study: 03/20/2018: LVEF 65-70%, mild MR, no pericardial effusion.)      Anesthesia Quick Evaluation

## 2019-09-07 NOTE — Progress Notes (Signed)
Anesthesia Chart Review: Same day workup  S/p recent Right arm brachial artery to basilic vein AV fistula creation on 06/29/2019.  She is currently on HD via right TDC TTS.  Follows with Dr. Einar Gip for management of diastolic HF with severe dependent edema. Echo 3/20 with EF of 55 to 60%, impaired LV relaxation, no significant valvular abnormalities. She is also s/p Medtronic dual chamber pacemaker placed 03/2018 for symptomatic high grade AV block.  TTE 02/16/19:  1. The left ventricle has normal systolic function, with an ejection fraction of 55-60%. The cavity size was normal. There is moderately increased left ventricular wall thickness. Left ventricular diastolic Doppler parameters are consistent with  impaired relaxation. Elevated left atrial and left ventricular end-diastolic pressures The E/e' is >15. No evidence of left ventricular regional wall motion abnormalities.  2. The right ventricle has normal systolic function. The cavity was normal. There is no increase in right ventricular wall thickness.  3. Left atrial size was severely dilated.  4. Right atrial size was mildly dilated.  5. The mitral valve is degenerative. Mild thickening of the mitral valve leaflet. There is mild to moderate mitral annular calcification present.  6. The aortic valve is tricuspid Mild sclerosis of the aortic valve. Aortic valve regurgitation was not assessed by color flow Doppler.  7. The inferior vena cava was dilated in size with >50% respiratory variability.  8. Small pericardial effusion.  9. The pericardial effusion is circumferential. 10. When compared to the prior study: 03/20/2018: LVEF 65-70%, mild MR, no pericardial effusion.

## 2019-09-08 ENCOUNTER — Encounter (HOSPITAL_COMMUNITY)
Admission: RE | Disposition: A | Discharge status: Home / Self Care | Payer: Self-pay | Source: Home / Self Care | Attending: Vascular Surgery

## 2019-09-08 ENCOUNTER — Ambulatory Visit (HOSPITAL_COMMUNITY)
Admission: RE | Admit: 2019-09-08 | Discharge: 2019-09-08 | Disposition: A | Discharge status: Home / Self Care | Payer: Medicare Other | Attending: Vascular Surgery | Admitting: Vascular Surgery

## 2019-09-08 ENCOUNTER — Encounter (HOSPITAL_COMMUNITY): Payer: Self-pay

## 2019-09-08 ENCOUNTER — Ambulatory Visit (HOSPITAL_COMMUNITY): Payer: Medicare Other | Admitting: Physician Assistant

## 2019-09-08 ENCOUNTER — Other Ambulatory Visit: Payer: Self-pay

## 2019-09-08 DIAGNOSIS — E1122 Type 2 diabetes mellitus with diabetic chronic kidney disease: Secondary | ICD-10-CM | POA: Insufficient documentation

## 2019-09-08 DIAGNOSIS — Z79890 Hormone replacement therapy (postmenopausal): Secondary | ICD-10-CM | POA: Insufficient documentation

## 2019-09-08 DIAGNOSIS — Z79899 Other long term (current) drug therapy: Secondary | ICD-10-CM | POA: Insufficient documentation

## 2019-09-08 DIAGNOSIS — I5030 Unspecified diastolic (congestive) heart failure: Secondary | ICD-10-CM | POA: Diagnosis not present

## 2019-09-08 DIAGNOSIS — Z992 Dependence on renal dialysis: Secondary | ICD-10-CM

## 2019-09-08 DIAGNOSIS — I132 Hypertensive heart and chronic kidney disease with heart failure and with stage 5 chronic kidney disease, or end stage renal disease: Secondary | ICD-10-CM | POA: Insufficient documentation

## 2019-09-08 DIAGNOSIS — N186 End stage renal disease: Secondary | ICD-10-CM

## 2019-09-08 HISTORY — DX: Other complications of anesthesia, initial encounter: T88.59XA

## 2019-09-08 HISTORY — DX: End stage renal disease: Z99.2

## 2019-09-08 HISTORY — DX: End stage renal disease: N18.6

## 2019-09-08 HISTORY — PX: BASCILIC VEIN TRANSPOSITION: SHX5742

## 2019-09-08 LAB — POCT I-STAT, CHEM 8
BUN: 40 mg/dL — ABNORMAL HIGH (ref 8–23)
Calcium, Ion: 1.25 mmol/L (ref 1.15–1.40)
Chloride: 97 mmol/L — ABNORMAL LOW (ref 98–111)
Creatinine, Ser: 4.6 mg/dL — ABNORMAL HIGH (ref 0.44–1.00)
Glucose, Bld: 95 mg/dL (ref 70–99)
HCT: 38 % (ref 36.0–46.0)
Hemoglobin: 12.9 g/dL (ref 12.0–15.0)
Potassium: 4.6 mmol/L (ref 3.5–5.1)
Sodium: 135 mmol/L (ref 135–145)
TCO2: 27 mmol/L (ref 22–32)

## 2019-09-08 LAB — GLUCOSE, CAPILLARY
Glucose-Capillary: 95 mg/dL (ref 70–99)
Glucose-Capillary: 115 mg/dL — ABNORMAL HIGH (ref 70–99)

## 2019-09-08 SURGERY — TRANSPOSITION, VEIN, BASILIC
Anesthesia: General | Site: Arm Upper | Laterality: Right

## 2019-09-08 MED ORDER — MIDAZOLAM HCL 5 MG/5ML IJ SOLN
INTRAMUSCULAR | Status: DC | PRN
  Administered 2019-09-08: 1 mg via INTRAVENOUS

## 2019-09-08 MED ORDER — PROPOFOL 10 MG/ML IV BOLUS
INTRAVENOUS | Status: AC
  Filled 2019-09-08: qty 20

## 2019-09-08 MED ORDER — ONDANSETRON HCL 4 MG/2ML IJ SOLN
INTRAMUSCULAR | Status: AC
  Filled 2019-09-08: qty 2

## 2019-09-08 MED ORDER — EPHEDRINE 5 MG/ML INJ
INTRAVENOUS | Status: AC
  Filled 2019-09-08: qty 10

## 2019-09-08 MED ORDER — PROPOFOL 10 MG/ML IV BOLUS
INTRAVENOUS | Status: DC | PRN
  Administered 2019-09-08: 150 mg via INTRAVENOUS

## 2019-09-08 MED ORDER — PHENYLEPHRINE HCL (PRESSORS) 10 MG/ML IV SOLN
INTRAVENOUS | Status: DC | PRN
  Administered 2019-09-08: 80 ug via INTRAVENOUS

## 2019-09-08 MED ORDER — CEFAZOLIN SODIUM-DEXTROSE 2-4 GM/100ML-% IV SOLN
2.0000 g | INTRAVENOUS | Status: AC
  Administered 2019-09-08: 2 g via INTRAVENOUS
  Filled 2019-09-08: qty 100

## 2019-09-08 MED ORDER — LIDOCAINE-EPINEPHRINE (PF) 1 %-1:200000 IJ SOLN
INTRAMUSCULAR | Status: AC
  Filled 2019-09-08: qty 30

## 2019-09-08 MED ORDER — EPHEDRINE SULFATE 50 MG/ML IJ SOLN
INTRAMUSCULAR | Status: DC | PRN
  Administered 2019-09-08: 10 mg via INTRAVENOUS

## 2019-09-08 MED ORDER — DEXAMETHASONE SODIUM PHOSPHATE 10 MG/ML IJ SOLN
INTRAMUSCULAR | Status: AC
  Filled 2019-09-08: qty 1

## 2019-09-08 MED ORDER — FENTANYL CITRATE (PF) 100 MCG/2ML IJ SOLN: 25.0000 ug | INTRAMUSCULAR | Status: DC | PRN

## 2019-09-08 MED ORDER — 0.9 % SODIUM CHLORIDE (POUR BTL) OPTIME
TOPICAL | Status: DC | PRN
  Administered 2019-09-08: 1000 mL

## 2019-09-08 MED ORDER — LIDOCAINE 2% (20 MG/ML) 5 ML SYRINGE
INTRAMUSCULAR | Status: DC | PRN
  Administered 2019-09-08: 40 mg via INTRAVENOUS

## 2019-09-08 MED ORDER — PHENYLEPHRINE HCL (PRESSORS) 10 MG/ML IV SOLN
INTRAVENOUS | Status: AC
  Filled 2019-09-08: qty 1

## 2019-09-08 MED ORDER — SODIUM CHLORIDE 0.9 % IV SOLN
INTRAVENOUS | Status: DC
  Administered 2019-09-08: 10:00:00 via INTRAVENOUS

## 2019-09-08 MED ORDER — SODIUM CHLORIDE 0.9 % IV SOLN
INTRAVENOUS | Status: DC | PRN
  Administered 2019-09-08: 500 mL

## 2019-09-08 MED ORDER — DEXAMETHASONE SODIUM PHOSPHATE 4 MG/ML IJ SOLN
INTRAMUSCULAR | Status: DC | PRN
  Administered 2019-09-08: 4 mg via INTRAVENOUS

## 2019-09-08 MED ORDER — FENTANYL CITRATE (PF) 100 MCG/2ML IJ SOLN
INTRAMUSCULAR | Status: DC | PRN
  Administered 2019-09-08 (×2): 50 ug via INTRAVENOUS

## 2019-09-08 MED ORDER — PHENYLEPHRINE 40 MCG/ML (10ML) SYRINGE FOR IV PUSH (FOR BLOOD PRESSURE SUPPORT)
PREFILLED_SYRINGE | INTRAVENOUS | Status: AC
  Filled 2019-09-08: qty 10

## 2019-09-08 MED ORDER — LIDOCAINE 2% (20 MG/ML) 5 ML SYRINGE
INTRAMUSCULAR | Status: AC
  Filled 2019-09-08: qty 5

## 2019-09-08 MED ORDER — MIDAZOLAM HCL 2 MG/2ML IJ SOLN
INTRAMUSCULAR | Status: AC
  Filled 2019-09-08: qty 2

## 2019-09-08 MED ORDER — ONDANSETRON HCL 4 MG/2ML IJ SOLN
INTRAMUSCULAR | Status: DC | PRN
  Administered 2019-09-08: 4 mg via INTRAVENOUS

## 2019-09-08 MED ORDER — SODIUM CHLORIDE 0.9 % IV SOLN
INTRAVENOUS | Status: AC
  Filled 2019-09-08: qty 1.2

## 2019-09-08 MED ORDER — FENTANYL CITRATE (PF) 250 MCG/5ML IJ SOLN
INTRAMUSCULAR | Status: AC
  Filled 2019-09-08: qty 5

## 2019-09-08 SURGICAL SUPPLY — 31 items
ARMBAND PINK RESTRICT EXTREMIT (MISCELLANEOUS) ×2 IMPLANT
BNDG ELASTIC 4X5.8 VLCR STR LF (GAUZE/BANDAGES/DRESSINGS) ×2 IMPLANT
CANISTER SUCT 3000ML PPV (MISCELLANEOUS) ×2 IMPLANT
CLIP VESOCCLUDE MED 24/CT (CLIP) ×2 IMPLANT
CLIP VESOCCLUDE MED 6/CT (CLIP) IMPLANT
CLIP VESOCCLUDE SM WIDE 24/CT (CLIP) ×2 IMPLANT
CLIP VESOCCLUDE SM WIDE 6/CT (CLIP) IMPLANT
COVER PROBE W GEL 5X96 (DRAPES) ×2 IMPLANT
COVER WAND RF STERILE (DRAPES) IMPLANT
DERMABOND ADVANCED (GAUZE/BANDAGES/DRESSINGS) ×1
DERMABOND ADVANCED .7 DNX12 (GAUZE/BANDAGES/DRESSINGS) ×1 IMPLANT
ELECT REM PT RETURN 9FT ADLT (ELECTROSURGICAL) ×2
ELECTRODE REM PT RTRN 9FT ADLT (ELECTROSURGICAL) ×1 IMPLANT
GLOVE BIO SURGEON STRL SZ7.5 (GLOVE) ×2 IMPLANT
GOWN STRL REUS W/ TWL LRG LVL3 (GOWN DISPOSABLE) ×2 IMPLANT
GOWN STRL REUS W/ TWL XL LVL3 (GOWN DISPOSABLE) ×1 IMPLANT
GOWN STRL REUS W/TWL LRG LVL3 (GOWN DISPOSABLE) ×2
GOWN STRL REUS W/TWL XL LVL3 (GOWN DISPOSABLE) ×1
KIT BASIN OR (CUSTOM PROCEDURE TRAY) ×2 IMPLANT
KIT TURNOVER KIT B (KITS) ×2 IMPLANT
NS IRRIG 1000ML POUR BTL (IV SOLUTION) ×2 IMPLANT
PACK CV ACCESS (CUSTOM PROCEDURE TRAY) ×2 IMPLANT
PAD ARMBOARD 7.5X6 YLW CONV (MISCELLANEOUS) ×4 IMPLANT
SUT MNCRL AB 4-0 PS2 18 (SUTURE) ×4 IMPLANT
SUT PROLENE 6 0 BV (SUTURE) ×2 IMPLANT
SUT SILK 2 0 SH (SUTURE) ×2 IMPLANT
SUT VIC AB 3-0 SH 27 (SUTURE) ×2
SUT VIC AB 3-0 SH 27X BRD (SUTURE) ×2 IMPLANT
TOWEL GREEN STERILE (TOWEL DISPOSABLE) ×2 IMPLANT
UNDERPAD 30X30 (UNDERPADS AND DIAPERS) ×2 IMPLANT
WATER STERILE IRR 1000ML POUR (IV SOLUTION) ×2 IMPLANT

## 2019-09-08 NOTE — Op Note (Signed)
    Patient name: Erika Fry MRN: [id number] DOB: 1946/03/15 Sex: female  09/08/2019 Pre-operative Diagnosis: esrd Post-operative diagnosis:  Same Surgeon:  Erlene Quan C. Donzetta Matters, MD Assistant: Leontine Locket, PA Procedure Performed:  Right arm second stage basilic vein fistula creation  Indications: 73 year old female on dialysis via catheter.  She is well matured right arm for stage basilic vein fistula.  She is now indicated for second stage.  Findings: Fistula throughout the course measured approximately 1 cm.  At completion there was a strong thrill and there is a strong radial pulse at the wrist.   Procedure:  The patient was identified in the holding area and taken to the operating room where LMA was induced.  She was sterilely prepped draped upper extremity usual fashion antibiotics were minister and timeout was called.  Longitudinal incision was made midway up the upper arm where the palpable thrill was peer we dissected down to the vein protected the nerve.  Branches were taken between clips and ties.  We then opened the original incision dissected back to the anastomosis placed a vessel loop around the fistula.  We made a third incision up to the axilla again dissecting branched and clips and ties.  Vein was marked for orientation.  Transected just past the anastomosis flushed with heparinized saline was noted to have no leaks.  Was tunneled laterally.  Both ends were spatulated sewn end-to-end with 6-0 Prolene suture.  Prior to completion of the flushing all directions.  Upon completion was a very strong thrill throughout the fistula.  There is palpable radial pulse the wrist.  Satisfied we irrigated the wounds obtain states closed in layers Vicryl Monocryl.  Dermabond placed to level skin.  All counts were correct at completion.  She tolerated procedure without any complication.  EBL: 50 cc    Brandon C. Donzetta Matters, MD [facility] [place] Office: [phone]  Pager: [phone]

## 2019-09-08 NOTE — Discharge Instructions (Signed)
[facility]  Discharge Instructions  AV Fistula or Graft Surgery for Dialysis Access  Please refer to the following instructions for your post-procedure care. Your surgeon or physician assistant will discuss any changes with you.  Activity  You may drive the day following your surgery, if you are comfortable and no longer taking prescription pain medication. Resume full activity as the soreness in your incision resolves.  Bathing/Showering  You may shower after you go home. Keep your incision dry for 48 hours. Do not soak in a bathtub, hot tub, or swim until the incision heals completely. You may not shower if you have a hemodialysis catheter.  Incision Care  Clean your incision with mild soap and water after 48 hours. Pat the area dry with a clean towel. You do not need a bandage unless otherwise instructed. Do not apply any ointments or creams to your incision. You may have skin glue on your incision. Do not peel it off. It will come off on its own in about one week. Your arm may swell a bit after surgery. To reduce swelling use pillows to elevate your arm so it is above your heart. Your doctor will tell you if you need to lightly wrap your arm with an ACE bandage.   Leave ace wrap on for 48 hours then you can remove and wash with soap and water daily.    Diet  Resume your normal diet. There are not special food restrictions following this procedure. In order to heal from your surgery, it is CRITICAL to get adequate nutrition. Your body requires vitamins, minerals, and protein. Vegetables are the best source of vitamins and minerals. Vegetables also provide the perfect balance of protein. Processed food has little nutritional value, so try to avoid this.  Medications  Resume taking all of your medications. If your incision is causing pain, you may take over-the counter pain relievers such as acetaminophen (Tylenol). If you were prescribed a stronger  pain medication, please be aware these medications can cause nausea and constipation. Prevent nausea by taking the medication with a snack or meal. Avoid constipation by drinking plenty of fluids and eating foods with high amount of fiber, such as fruits, vegetables, and grains.  Do not take Tylenol if you are taking prescription pain medications.  Follow up Your surgeon may want to see you in the office following your access surgery. If so, this will be arranged at the time of your surgery.  Please call us immediately for any of the following conditions:  Increased pain, redness, drainage (pus) from your incision site Fever of 101 degrees or higher Severe or worsening pain at your incision site Hand pain or numbness.  Reduce your risk of vascular disease:  Stop smoking. If you would like help, call [facility] at 1-800-QUIT-NOW 781-749-6819) or [place] at [place] your cholesterol Maintain a desired weight Control your diabetes Keep your blood pressure down  Dialysis  It will take several weeks to several months for your new dialysis access to be ready for use. Your surgeon will determine when it is okay to use it. Your nephrologist will continue to direct your dialysis. You can continue to use your Permcath until your new access is ready for use.   09/08/2019 JALEXIS BREED [id number] Apr 07, 1946  Surgeon(s): Waynetta Sandy, MD  Procedure(s): BASILIC VEIN TRANSPOSITION RIGHT SECOND STAGE  x Do not stick fistula for 6 weeks    If you have any questions,  please call the office at [phone].

## 2019-09-08 NOTE — H&P (Addendum)
   History and Physical Update  The patient was interviewed and re-examined.  The patient's previous History and Physical has been reviewed and is unchanged from recent office visit. Plan for right arm second stage bvt today in OR.   Kelsie Zaborowski C. Donzetta Matters, MD [facility] [place] Office: [phone] Pager: [phone]  09/08/2019, 8:15 AM

## 2019-09-08 NOTE — Anesthesia Postprocedure Evaluation (Signed)
Anesthesia Post Note  Patient: Erika Fry  Procedure(s) Performed: BASILIC VEIN TRANSPOSITION RIGHT SECOND STAGE (Right Arm Upper)     Patient location during evaluation: PACU Anesthesia Type: General Level of consciousness: awake and alert Pain management: pain level controlled Vital Signs Assessment: post-procedure vital signs reviewed and stable Respiratory status: spontaneous breathing, nonlabored ventilation, respiratory function stable and patient connected to nasal cannula oxygen Cardiovascular status: stable and blood pressure returned to baseline Postop Assessment: no apparent nausea or vomiting Anesthetic complications: no    Last Vitals:  Vitals:   09/08/19 1230 09/08/19 1233  BP:  (!) 109/56  Pulse: 84 83  Resp: 15 15  Temp: (!) 36.2 C   SpO2: 100% 100%    Last Pain:  Vitals:   09/08/19 1230  PainSc: 4                  Derak Schurman COKER

## 2019-09-08 NOTE — Anesthesia Procedure Notes (Addendum)
Procedure Name: LMA Insertion Date/Time: 09/08/2019 10:04 AM Performed by: Scheryl Darter, CRNA Pre-anesthesia Checklist: Patient identified, Emergency Drugs available, Suction available and Patient being monitored Patient Re-evaluated:Patient Re-evaluated prior to induction Oxygen Delivery Method: Circle System Utilized Preoxygenation: Pre-oxygenation with 100% oxygen Induction Type: IV induction Ventilation: Mask ventilation without difficulty LMA: LMA inserted LMA Size: 4.0 Number of attempts: 1 Airway Equipment and Method: Bite block Placement Confirmation: positive ETCO2 Tube secured with: Tape Dental Injury: Teeth and Oropharynx as per pre-operative assessment

## 2019-09-08 NOTE — Transfer of Care (Signed)
Immediate Anesthesia Transfer of Care Note  Patient: Erika Fry  Procedure(s) Performed: BASILIC VEIN TRANSPOSITION RIGHT SECOND STAGE (Right Arm Upper)  Patient Location: PACU  Anesthesia Type:General  Level of Consciousness: awake, alert , oriented and sedated  Airway & Oxygen Therapy: Patient Spontanous Breathing and Patient connected to nasal cannula oxygen  Post-op Assessment: Report given to RN and Post -op Vital signs reviewed and stable  Post vital signs: Reviewed and stable  Last Vitals:  Vitals Value Taken Time  BP 109/58 09/08/19 1219  Temp 36.3 C 09/08/19 1136  Pulse 82 09/08/19 1220  Resp 14 09/08/19 1220  SpO2 100 % 09/08/19 1220  Vitals shown include unvalidated device data.  Last Pain:  Vitals:   09/08/19 1136  PainSc: 10-Worst pain ever         Complications: No apparent anesthesia complications

## 2019-09-08 NOTE — Anesthesia Postprocedure Evaluation (Deleted)
Anesthesia Post Note  Patient: Erika Fry  Procedure(s) Performed: BASILIC VEIN TRANSPOSITION RIGHT SECOND STAGE (Right Arm Upper)     Anesthesia Type: General Level of consciousness: awake, oriented, awake and alert and sedated Respiratory status: spontaneous breathing Cardiovascular status: stable and blood pressure returned to baseline    Last Vitals:  Vitals:   09/08/19 0758 09/08/19 1136  BP: (!) 138/58 (!) 148/83  Pulse: 71 97  Resp: 16 (!) 21  Temp: 37.1 C (!) 36.3 C  SpO2: 96% 100%    Last Pain:  Vitals:   09/08/19 1136  PainSc: 10-Worst pain ever                 Leonia Reeves

## 2019-09-09 ENCOUNTER — Encounter (HOSPITAL_COMMUNITY): Payer: Self-pay | Admitting: Vascular Surgery

## 2019-09-09 DIAGNOSIS — Z950 Presence of cardiac pacemaker: Secondary | ICD-10-CM

## 2019-09-09 DIAGNOSIS — I441 Atrioventricular block, second degree: Secondary | ICD-10-CM

## 2019-09-09 DIAGNOSIS — Z45018 Encounter for adjustment and management of other part of cardiac pacemaker: Secondary | ICD-10-CM

## 2019-09-12 ENCOUNTER — Encounter: Payer: Self-pay | Admitting: Cardiology

## 2019-09-29 ENCOUNTER — Other Ambulatory Visit: Payer: Self-pay

## 2019-09-29 ENCOUNTER — Encounter: Payer: Self-pay | Admitting: Cardiology

## 2019-09-29 ENCOUNTER — Ambulatory Visit (INDEPENDENT_AMBULATORY_CARE_PROVIDER_SITE_OTHER): Payer: Medicare Other | Admitting: Cardiology

## 2019-09-29 DIAGNOSIS — Z950 Presence of cardiac pacemaker: Secondary | ICD-10-CM

## 2019-09-29 DIAGNOSIS — Z45018 Encounter for adjustment and management of other part of cardiac pacemaker: Secondary | ICD-10-CM

## 2019-09-29 DIAGNOSIS — I441 Atrioventricular block, second degree: Secondary | ICD-10-CM

## 2019-09-29 NOTE — Progress Notes (Signed)
Scheduled  In office pacemaker check 09/29/19   Single (S)/Dual (D)/BV: D Presenting ASVP Pacemaker dependant:  Yes.  ASVP @35 /min. Longevity 6 Years. Magnet rate: >85%. AP 5%, VP 99%. BP NA AMS Episodes 4.  AT/AF burden <1% . Longest 5 min. EGM  AT. Latest 07/16/19. HVR 0,  Lead measurements: Stable. Thoracic impedance: NA Histogram: Low (L)/normal (N)/high (H)  Normal. Patient activity Low.   Observations: Normal device function. Changes: None    ICD-10-CM   1. Encounter for care of pacemaker  Z45.018   2. Pacemaker Medtronic Azure XT DR MRI Dual Chamber Pacemaker 03/25/2018  Z95.0   3. AV block, Mobitz 2  I44.1

## 2019-10-01 ENCOUNTER — Ambulatory Visit (INDEPENDENT_AMBULATORY_CARE_PROVIDER_SITE_OTHER): Payer: Medicare Other | Admitting: Vascular Surgery

## 2019-10-01 ENCOUNTER — Other Ambulatory Visit: Payer: Self-pay

## 2019-10-01 ENCOUNTER — Encounter: Payer: Self-pay | Admitting: Vascular Surgery

## 2019-10-01 VITALS — BP 104/70 | HR 81 | Temp 96.0°F | Resp 20 | Ht 65.0 in | Wt 163.0 lb

## 2019-10-01 DIAGNOSIS — N186 End stage renal disease: Secondary | ICD-10-CM

## 2019-10-01 NOTE — Progress Notes (Signed)
    Subjective:     Patient ID: Erika Fry, female   DOB: August 29, 1946, 73 y.o.   MRN: [id number]  HPI 73 year old female has undergone two-stage basilic vein fistula creation.  She is doing well.  All incisions of healed.  Currently dialyzing via right IJ tunnel catheter.   Review of Systems Discomfort right upper arm    Objective:   Physical Exam Vitals:   10/01/19 0912  BP: 104/70  Pulse: 81  Resp: 20  Temp: (!) 96 F (35.6 C)  SpO2: 96%   Awake alert oriented Nonlabored respirations Right upper arm with strong thrill in the medial aspect of the upper arm All incisions healing well Palpable right radial pulse     Assessment:     73 year old female status post two-stage basilic vein fistula creation.    Plan:     Continue catheter use for now. Okay to use fistula in 2 weeks and catheter can be removed after 1 week of use. She can follow-up with me on a as needed basis.    Dmarco Baldus C. Donzetta Matters, MD [facility] [place] Office: [phone] Pager: [phone]

## 2019-12-03 DIAGNOSIS — Z8616 Personal history of COVID-19: Secondary | ICD-10-CM

## 2019-12-03 DIAGNOSIS — J189 Pneumonia, unspecified organism: Secondary | ICD-10-CM

## 2019-12-03 HISTORY — DX: Personal history of COVID-19: Z86.16

## 2019-12-03 HISTORY — DX: Pneumonia, unspecified organism: J18.9

## 2019-12-09 DIAGNOSIS — Z950 Presence of cardiac pacemaker: Secondary | ICD-10-CM

## 2019-12-09 DIAGNOSIS — I441 Atrioventricular block, second degree: Secondary | ICD-10-CM

## 2019-12-09 DIAGNOSIS — Z45018 Encounter for adjustment and management of other part of cardiac pacemaker: Secondary | ICD-10-CM

## 2019-12-24 ENCOUNTER — Telehealth: Payer: Self-pay | Admitting: Unknown Physician Specialty

## 2019-12-24 NOTE — Telephone Encounter (Signed)
Called to discuss with patient about Covid symptoms and the use of bamlanivimab, a monoclonal antibody infusion for those with mild to moderate Covid symptoms and at a high risk of hospitalization.  Pt is qualified for this infusion at the [facility] due to Age > 65   Message left to call back  

## 2019-12-24 NOTE — Telephone Encounter (Signed)
Discussed with patient's son about Covid symptoms and the use of bamlanivimab, a monoclonal antibody infusion for those with mild to moderate Covid symptoms and at a high risk of hospitalization.  Pt is not qualified for this infusion due to symptoms ongoing for at least 2 weeks

## 2019-12-25 ENCOUNTER — Encounter (HOSPITAL_COMMUNITY): Payer: Self-pay | Admitting: Emergency Medicine

## 2019-12-25 ENCOUNTER — Inpatient Hospital Stay (HOSPITAL_COMMUNITY)
Admission: EM | Admit: 2019-12-25 | Discharge: 2019-12-29 | DRG: 177 | Disposition: A | Discharge status: Home Health | Payer: Medicare Other | Attending: Internal Medicine | Admitting: Internal Medicine

## 2019-12-25 ENCOUNTER — Other Ambulatory Visit: Payer: Self-pay

## 2019-12-25 ENCOUNTER — Emergency Department (HOSPITAL_COMMUNITY): Payer: Medicare Other

## 2019-12-25 DIAGNOSIS — I10 Essential (primary) hypertension: Secondary | ICD-10-CM

## 2019-12-25 DIAGNOSIS — Z950 Presence of cardiac pacemaker: Secondary | ICD-10-CM | POA: Diagnosis not present

## 2019-12-25 DIAGNOSIS — Z833 Family history of diabetes mellitus: Secondary | ICD-10-CM | POA: Diagnosis not present

## 2019-12-25 DIAGNOSIS — I441 Atrioventricular block, second degree: Secondary | ICD-10-CM | POA: Diagnosis present

## 2019-12-25 DIAGNOSIS — I132 Hypertensive heart and chronic kidney disease with heart failure and with stage 5 chronic kidney disease, or end stage renal disease: Secondary | ICD-10-CM | POA: Diagnosis present

## 2019-12-25 DIAGNOSIS — K219 Gastro-esophageal reflux disease without esophagitis: Secondary | ICD-10-CM | POA: Diagnosis present

## 2019-12-25 DIAGNOSIS — M109 Gout, unspecified: Secondary | ICD-10-CM | POA: Diagnosis present

## 2019-12-25 DIAGNOSIS — M19042 Primary osteoarthritis, left hand: Secondary | ICD-10-CM | POA: Diagnosis present

## 2019-12-25 DIAGNOSIS — Z8249 Family history of ischemic heart disease and other diseases of the circulatory system: Secondary | ICD-10-CM

## 2019-12-25 DIAGNOSIS — R0902 Hypoxemia: Secondary | ICD-10-CM | POA: Diagnosis present

## 2019-12-25 DIAGNOSIS — Z79899 Other long term (current) drug therapy: Secondary | ICD-10-CM | POA: Diagnosis not present

## 2019-12-25 DIAGNOSIS — M19041 Primary osteoarthritis, right hand: Secondary | ICD-10-CM | POA: Diagnosis present

## 2019-12-25 DIAGNOSIS — N3010 Interstitial cystitis (chronic) without hematuria: Secondary | ICD-10-CM | POA: Diagnosis present

## 2019-12-25 DIAGNOSIS — D631 Anemia in chronic kidney disease: Secondary | ICD-10-CM | POA: Diagnosis present

## 2019-12-25 DIAGNOSIS — N2581 Secondary hyperparathyroidism of renal origin: Secondary | ICD-10-CM | POA: Diagnosis present

## 2019-12-25 DIAGNOSIS — E1122 Type 2 diabetes mellitus with diabetic chronic kidney disease: Secondary | ICD-10-CM | POA: Diagnosis present

## 2019-12-25 DIAGNOSIS — M898X9 Other specified disorders of bone, unspecified site: Secondary | ICD-10-CM | POA: Diagnosis present

## 2019-12-25 DIAGNOSIS — E039 Hypothyroidism, unspecified: Secondary | ICD-10-CM | POA: Diagnosis present

## 2019-12-25 DIAGNOSIS — M170 Bilateral primary osteoarthritis of knee: Secondary | ICD-10-CM | POA: Diagnosis present

## 2019-12-25 DIAGNOSIS — Z885 Allergy status to narcotic agent status: Secondary | ICD-10-CM

## 2019-12-25 DIAGNOSIS — N186 End stage renal disease: Secondary | ICD-10-CM

## 2019-12-25 DIAGNOSIS — I5032 Chronic diastolic (congestive) heart failure: Secondary | ICD-10-CM | POA: Diagnosis present

## 2019-12-25 DIAGNOSIS — E119 Type 2 diabetes mellitus without complications: Secondary | ICD-10-CM

## 2019-12-25 DIAGNOSIS — U071 COVID-19: Principal | ICD-10-CM | POA: Diagnosis present

## 2019-12-25 DIAGNOSIS — G43909 Migraine, unspecified, not intractable, without status migrainosus: Secondary | ICD-10-CM | POA: Diagnosis present

## 2019-12-25 DIAGNOSIS — E7800 Pure hypercholesterolemia, unspecified: Secondary | ICD-10-CM | POA: Diagnosis present

## 2019-12-25 DIAGNOSIS — Z881 Allergy status to other antibiotic agents status: Secondary | ICD-10-CM

## 2019-12-25 DIAGNOSIS — Z992 Dependence on renal dialysis: Secondary | ICD-10-CM

## 2019-12-25 DIAGNOSIS — J069 Acute upper respiratory infection, unspecified: Secondary | ICD-10-CM

## 2019-12-25 DIAGNOSIS — Z888 Allergy status to other drugs, medicaments and biological substances status: Secondary | ICD-10-CM

## 2019-12-25 DIAGNOSIS — B3749 Other urogenital candidiasis: Secondary | ICD-10-CM | POA: Diagnosis present

## 2019-12-25 DIAGNOSIS — J1282 Pneumonia due to coronavirus disease 2019: Secondary | ICD-10-CM | POA: Diagnosis present

## 2019-12-25 DIAGNOSIS — I9589 Other hypotension: Secondary | ICD-10-CM | POA: Diagnosis present

## 2019-12-25 DIAGNOSIS — Z79890 Hormone replacement therapy (postmenopausal): Secondary | ICD-10-CM

## 2019-12-25 DIAGNOSIS — Z800 Family history of malignant neoplasm of digestive organs: Secondary | ICD-10-CM

## 2019-12-25 LAB — C-REACTIVE PROTEIN: CRP: 21 mg/dL — ABNORMAL HIGH (ref <1.0)

## 2019-12-25 LAB — CBC
HCT: 42.7 % (ref 36.0–46.0)
Hemoglobin: 13.5 g/dL (ref 12.0–15.0)
MCH: 28.1 pg (ref 26.0–34.0)
MCHC: 31.6 g/dL (ref 30.0–36.0)
MCV: 89 fL (ref 80.0–100.0)
Platelets: DECREASED 10*3/uL (ref 150–400)
RBC: 4.8 MIL/uL (ref 3.87–5.11)
RDW: 15.5 % (ref 11.5–15.5)
WBC: 3.5 10*3/uL — ABNORMAL LOW (ref 4.0–10.5)
nRBC: 0 % (ref 0.0–0.2)

## 2019-12-25 LAB — URINALYSIS, ROUTINE W REFLEX MICROSCOPIC
Bilirubin Urine: NEGATIVE
Glucose, UA: NEGATIVE mg/dL
Ketones, ur: NEGATIVE mg/dL
Nitrite: NEGATIVE
Protein, ur: >300 mg/dL — AB
Specific Gravity, Urine: 1.02 (ref 1.005–1.030)
pH: 8.5 — ABNORMAL HIGH (ref 5.0–8.0)

## 2019-12-25 LAB — HEPATIC FUNCTION PANEL
ALT: 14 U/L (ref 0–44)
AST: 23 U/L (ref 15–41)
Albumin: 2.9 g/dL — ABNORMAL LOW (ref 3.5–5.0)
Alkaline Phosphatase: 104 U/L (ref 38–126)
Bilirubin, Direct: 0.1 mg/dL (ref 0.0–0.2)
Indirect Bilirubin: 0.7 mg/dL (ref 0.3–0.9)
Total Bilirubin: 0.8 mg/dL (ref 0.3–1.2)
Total Protein: 6.9 g/dL (ref 6.5–8.1)

## 2019-12-25 LAB — TRIGLYCERIDES: Triglycerides: 194 mg/dL — ABNORMAL HIGH (ref <150)

## 2019-12-25 LAB — URINALYSIS, MICROSCOPIC (REFLEX)

## 2019-12-25 LAB — D-DIMER, QUANTITATIVE: D-Dimer, Quant: 7.43 ug/mL-FEU — ABNORMAL HIGH (ref 0.00–0.50)

## 2019-12-25 LAB — DIFFERENTIAL
Abs Immature Granulocytes: 0.01 10*3/uL (ref 0.00–0.07)
Basophils Absolute: 0 10*3/uL (ref 0.0–0.1)
Basophils Relative: 1 %
Eosinophils Absolute: 0.3 10*3/uL (ref 0.0–0.5)
Eosinophils Relative: 6 %
Immature Granulocytes: 0 %
Lymphocytes Relative: 14 %
Lymphs Abs: 0.6 10*3/uL — ABNORMAL LOW (ref 0.7–4.0)
Monocytes Absolute: 0.4 10*3/uL (ref 0.1–1.0)
Monocytes Relative: 10 %
Neutro Abs: 2.9 10*3/uL (ref 1.7–7.7)
Neutrophils Relative %: 69 %

## 2019-12-25 LAB — BASIC METABOLIC PANEL
Anion gap: 18 — ABNORMAL HIGH (ref 5–15)
BUN: 39 mg/dL — ABNORMAL HIGH (ref 8–23)
CO2: 28 mmol/L (ref 22–32)
Calcium: 8.7 mg/dL — ABNORMAL LOW (ref 8.9–10.3)
Chloride: 89 mmol/L — ABNORMAL LOW (ref 98–111)
Creatinine, Ser: 7.24 mg/dL — ABNORMAL HIGH (ref 0.44–1.00)
GFR calc Af Amer: 6 mL/min — ABNORMAL LOW (ref >60)
GFR calc non Af Amer: 5 mL/min — ABNORMAL LOW (ref >60)
Glucose, Bld: 125 mg/dL — ABNORMAL HIGH (ref 70–99)
Potassium: 4 mmol/L (ref 3.5–5.1)
Sodium: 135 mmol/L (ref 135–145)

## 2019-12-25 LAB — FIBRINOGEN: Fibrinogen: 657 mg/dL — ABNORMAL HIGH (ref 210–475)

## 2019-12-25 LAB — LACTATE DEHYDROGENASE: LDH: 198 U/L — ABNORMAL HIGH (ref 98–192)

## 2019-12-25 LAB — PROCALCITONIN: Procalcitonin: 3.28 ng/mL

## 2019-12-25 LAB — POC SARS CORONAVIRUS 2 AG -  ED: SARS Coronavirus 2 Ag: POSITIVE — AB

## 2019-12-25 LAB — LACTIC ACID, PLASMA: Lactic Acid, Venous: 1 mmol/L (ref 0.5–1.9)

## 2019-12-25 LAB — FERRITIN: Ferritin: 1192 ng/mL — ABNORMAL HIGH (ref 11–307)

## 2019-12-25 MED ORDER — ONDANSETRON HCL 4 MG PO TABS: 4.0000 mg | ORAL_TABLET | Freq: Four times a day (QID) | ORAL | Status: DC | PRN

## 2019-12-25 MED ORDER — FAMOTIDINE 20 MG PO TABS
20.0000 mg | ORAL_TABLET | Freq: Every day | ORAL | Status: DC
  Administered 2019-12-26 – 2019-12-28 (×4): 20 mg via ORAL
  Filled 2019-12-25 (×4): qty 1

## 2019-12-25 MED ORDER — SODIUM CHLORIDE 0.9 % IV SOLN
200.0000 mg | Freq: Once | INTRAVENOUS | Status: DC
  Filled 2019-12-25 (×2): qty 40

## 2019-12-25 MED ORDER — SODIUM CHLORIDE 0.9 % IV SOLN
100.0000 mg | Freq: Every day | INTRAVENOUS | Status: AC
  Administered 2019-12-26 – 2019-12-29 (×4): 100 mg via INTRAVENOUS
  Filled 2019-12-25 (×4): qty 20

## 2019-12-25 MED ORDER — SIMVASTATIN 20 MG PO TABS
40.0000 mg | ORAL_TABLET | Freq: Every day | ORAL | Status: DC
  Administered 2019-12-26 – 2019-12-28 (×4): 40 mg via ORAL
  Filled 2019-12-25 (×4): qty 2

## 2019-12-25 MED ORDER — FLAVOXATE HCL 100 MG PO TABS
100.0000 mg | ORAL_TABLET | Freq: Three times a day (TID) | ORAL | Status: DC
  Administered 2019-12-26 – 2019-12-29 (×11): 100 mg via ORAL
  Filled 2019-12-25 (×15): qty 1

## 2019-12-25 MED ORDER — PENTAFLUOROPROP-TETRAFLUOROETH EX AERO: 1.0000 "application " | INHALATION_SPRAY | CUTANEOUS | Status: DC | PRN

## 2019-12-25 MED ORDER — DOCUSATE SODIUM 100 MG PO CAPS: 100.0000 mg | ORAL_CAPSULE | Freq: Every day | ORAL | Status: DC

## 2019-12-25 MED ORDER — GABAPENTIN 100 MG PO CAPS
100.0000 mg | ORAL_CAPSULE | Freq: Every day | ORAL | Status: DC
  Administered 2019-12-26 – 2019-12-28 (×4): 100 mg via ORAL
  Filled 2019-12-25 (×4): qty 1

## 2019-12-25 MED ORDER — DEXAMETHASONE 4 MG PO TABS
6.0000 mg | ORAL_TABLET | Freq: Every day | ORAL | Status: DC
  Administered 2019-12-25 – 2019-12-29 (×5): 6 mg via ORAL
  Filled 2019-12-25 (×5): qty 2

## 2019-12-25 MED ORDER — MIDODRINE HCL 5 MG PO TABS: 10.0000 mg | ORAL_TABLET | ORAL | Status: DC

## 2019-12-25 MED ORDER — CHLORHEXIDINE GLUCONATE CLOTH 2 % EX PADS
6.0000 | MEDICATED_PAD | Freq: Every day | CUTANEOUS | Status: DC
  Administered 2019-12-26 – 2019-12-28 (×2): 6 via TOPICAL

## 2019-12-25 MED ORDER — INSULIN ASPART 100 UNIT/ML ~~LOC~~ SOLN
0.0000 [IU] | SUBCUTANEOUS | Status: DC
  Administered 2019-12-26: 3 [IU] via SUBCUTANEOUS
  Administered 2019-12-26: 5 [IU] via SUBCUTANEOUS
  Administered 2019-12-26: 2 [IU] via SUBCUTANEOUS
  Administered 2019-12-27: 8 [IU] via SUBCUTANEOUS
  Administered 2019-12-27: 5 [IU] via SUBCUTANEOUS
  Administered 2019-12-27: 3 [IU] via SUBCUTANEOUS
  Administered 2019-12-27: 5 [IU] via SUBCUTANEOUS
  Administered 2019-12-27: 3 [IU] via SUBCUTANEOUS
  Administered 2019-12-28 (×2): 5 [IU] via SUBCUTANEOUS
  Administered 2019-12-28 (×2): 8 [IU] via SUBCUTANEOUS
  Administered 2019-12-28: 5 [IU] via SUBCUTANEOUS
  Administered 2019-12-28: 2 [IU] via SUBCUTANEOUS
  Administered 2019-12-29: 3 [IU] via SUBCUTANEOUS
  Administered 2019-12-29: 8 [IU] via SUBCUTANEOUS

## 2019-12-25 MED ORDER — FERRIC CITRATE 1 GM 210 MG(FE) PO TABS
420.0000 mg | ORAL_TABLET | Freq: Three times a day (TID) | ORAL | Status: DC
  Administered 2019-12-26 – 2019-12-29 (×11): 420 mg via ORAL
  Filled 2019-12-25 (×11): qty 2

## 2019-12-25 MED ORDER — MIDODRINE HCL 5 MG PO TABS
10.0000 mg | ORAL_TABLET | ORAL | Status: DC
  Administered 2019-12-28: 10 mg via ORAL
  Filled 2019-12-25 (×3): qty 2

## 2019-12-25 MED ORDER — ZINC SULFATE 220 (50 ZN) MG PO CAPS
220.0000 mg | ORAL_CAPSULE | Freq: Every day | ORAL | Status: DC
  Administered 2019-12-26 – 2019-12-29 (×4): 220 mg via ORAL
  Filled 2019-12-25 (×4): qty 1

## 2019-12-25 MED ORDER — FERRIC CITRATE 1 GM 210 MG(FE) PO TABS: 210.0000 mg | ORAL_TABLET | Freq: Three times a day (TID) | ORAL | Status: DC

## 2019-12-25 MED ORDER — ONDANSETRON HCL 4 MG/2ML IJ SOLN: 4.0000 mg | Freq: Four times a day (QID) | INTRAMUSCULAR | Status: DC | PRN

## 2019-12-25 MED ORDER — DOXERCALCIFEROL 4 MCG/2ML IV SOLN
2.0000 ug | INTRAVENOUS | Status: DC
  Filled 2019-12-25 (×2): qty 2

## 2019-12-25 MED ORDER — ASCORBIC ACID 500 MG PO TABS
500.0000 mg | ORAL_TABLET | Freq: Every day | ORAL | Status: DC
  Administered 2019-12-26 – 2019-12-29 (×4): 500 mg via ORAL
  Filled 2019-12-25 (×4): qty 1

## 2019-12-25 MED ORDER — HEPARIN SODIUM (PORCINE) 5000 UNIT/ML IJ SOLN
5000.0000 [IU] | Freq: Three times a day (TID) | INTRAMUSCULAR | Status: DC
  Administered 2019-12-26 – 2019-12-29 (×11): 5000 [IU] via SUBCUTANEOUS
  Filled 2019-12-25 (×12): qty 1

## 2019-12-25 MED ORDER — CINACALCET HCL 30 MG PO TABS: 30.0000 mg | ORAL_TABLET | ORAL | Status: DC

## 2019-12-25 MED ORDER — POLYETHYLENE GLYCOL 3350 17 G PO PACK
17.0000 g | PACK | Freq: Every day | ORAL | Status: DC | PRN
  Administered 2019-12-27: 17 g via ORAL
  Filled 2019-12-25: qty 1

## 2019-12-25 MED ORDER — HYDROCOD POLST-CPM POLST ER 10-8 MG/5ML PO SUER
5.0000 mL | Freq: Two times a day (BID) | ORAL | Status: DC | PRN
  Filled 2019-12-25: qty 5

## 2019-12-25 MED ORDER — HEPARIN SODIUM (PORCINE) 1000 UNIT/ML DIALYSIS: 20.0000 [IU]/kg | INTRAMUSCULAR | Status: DC | PRN

## 2019-12-25 MED ORDER — FLUCONAZOLE 100 MG PO TABS
200.0000 mg | ORAL_TABLET | ORAL | Status: DC
  Administered 2019-12-27 – 2019-12-29 (×2): 200 mg via ORAL
  Filled 2019-12-25: qty 2

## 2019-12-25 MED ORDER — SODIUM CHLORIDE 0.9 % IV SOLN: 100.0000 mL | INTRAVENOUS | Status: DC | PRN

## 2019-12-25 MED ORDER — GUAIFENESIN-DM 100-10 MG/5ML PO SYRP: 10.0000 mL | ORAL_SOLUTION | ORAL | Status: DC | PRN

## 2019-12-25 MED ORDER — HYDROCODONE-ACETAMINOPHEN 5-325 MG PO TABS
1.0000 | ORAL_TABLET | ORAL | Status: DC | PRN
  Administered 2019-12-27 – 2019-12-29 (×3): 1 via ORAL
  Filled 2019-12-25 (×3): qty 1

## 2019-12-25 MED ORDER — LEVOTHYROXINE SODIUM 75 MCG PO TABS
75.0000 ug | ORAL_TABLET | Freq: Every day | ORAL | Status: DC
  Administered 2019-12-26 – 2019-12-29 (×4): 75 ug via ORAL
  Filled 2019-12-25 (×4): qty 1

## 2019-12-25 MED ORDER — LIDOCAINE HCL (PF) 1 % IJ SOLN: 5.0000 mL | INTRAMUSCULAR | Status: DC | PRN

## 2019-12-25 MED ORDER — SODIUM CHLORIDE 0.9% FLUSH
3.0000 mL | Freq: Once | INTRAVENOUS | Status: AC
  Administered 2019-12-25: 14:00:00 3 mL via INTRAVENOUS

## 2019-12-25 MED ORDER — LIDOCAINE-PRILOCAINE 2.5-2.5 % EX CREA: 1.0000 "application " | TOPICAL_CREAM | CUTANEOUS | Status: DC | PRN

## 2019-12-25 NOTE — H&P (Signed)
History and Physical  JANASHIA PARCO KKX:[id number] DOB: Dec 30, 1945 DOA: 12/25/2019  Referring physician: Dr Alvino Chapel, ED physician PCP: [name], MD  Outpatient Specialists:   Patient Coming From: home  Chief Complaint: weakness  HPI: REBAKAH COKLEY is a 74 y.o. female with a history of end-stage renal disease on dialysis Tuesdays, Thursdays, Saturdays, diabetes diet-controlled, GERD, hypothyroidism, hypertension, anemia.  Patient seen for increasing weakness.  The patient was diagnosed earlier this week after thinking that she had a sinus infection for approximately a week.  She had a Covid test, which was positive.  She was supposed to get dialysis on Thursday, however she felt too weak to go and get it.  She ended up getting dialysis yesterday was post repeat dialysis today, but she felt too weak.  She has some mild shortness of breath at rest, worse with exertion.  No other palliating or provoking factors.  Emergency Department Course: Covid positive.  Review of Systems:   Pt complains of mild dysuria.  Pt denies any fevers, chills, nausea, vomiting, diarrhea, constipation, abdominal pain, shortness of breath, dyspnea on exertion, orthopnea, cough, wheezing, palpitations, headache, vision changes, lightheadedness, dizziness, melena, rectal bleeding.  Review of systems are otherwise negative  Past Medical History:  Diagnosis Date  . Arthritis    "knees, thumbs" (03/25/2018)  . AV block, [place] 2 03/25/2018  . CKD (chronic kidney disease), stage IV ([place])   . Complication of anesthesia    Reports had hard time waking up in the past  . Diet-controlled diabetes mellitus ([place])   . Encounter for care of pacemaker 09/07/2019  . End stage renal failure on dialysis ([place])    T, Th, Sat [employer]  . GERD (gastroesophageal reflux disease)   . Gout    "on daily RX" (03/25/2018)  . Heart murmur   . High cholesterol   . Hypertension   . Hypothyroidism   . Iron deficiency  anemia    "had to get an iron infusion"  . Migraine    "used to have them growing up" (03/25/2018)  . Presence of permanent cardiac pacemaker 03/25/2018   Past Surgical History:  Procedure Laterality Date  . APPENDECTOMY    . AV FISTULA PLACEMENT Right 06/29/2019   Procedure: ARTERIOVENOUS (AV) FISTULA CREATION RIGHT UPPER  ARM;  Surgeon: Waynetta Sandy, MD;  Location: [place];  Service: Vascular;  Laterality: Right;  . BASCILIC VEIN TRANSPOSITION Right 09/08/2019   Procedure: BASILIC VEIN TRANSPOSITION RIGHT SECOND STAGE;  Surgeon: Waynetta Sandy, MD;  Location: [place];  Service: Vascular;  Laterality: Right;  . BIOPSY  05/19/2018   Procedure: BIOPSY;  Surgeon: Ladene Artist, MD;  Location: [place];  Service: Endoscopy;;  . Consuela Mimes W/ RETROGRADES Bilateral 01/14/2019   Procedure: CYSTOSCOPY WITH RETROGRADE PYELOGRAM BILATERAL HYDRODISTENTION;  Surgeon: Ardis Hughs, MD;  Location: [facility];  Service: Urology;  Laterality: Bilateral;  . ESOPHAGOGASTRODUODENOSCOPY N/A 05/19/2018   Procedure: ESOPHAGOGASTRODUODENOSCOPY (EGD);  Surgeon: Ladene Artist, MD;  Location[facility]COPY;  Service: Endoscopy;  Laterality: N/A;  . ESOPHAGOGASTRODUODENOSCOPY (EGD) WITH PROPOFOL N/A 01/28/2019   Procedure: ESOPHAGOGASTRODUODENOSCOPY (EGD) WITH PROPOFOL;  Surgeon: Rush Landmark Telford Nab., MD;  Location: [place];  Service: Gastroenterology;  Laterality: N/A;  . INSERT / REPLACE / REMOVE PACEMAKER  03/25/2018  . IR FLUORO GUIDE CV LINE RIGHT  05/18/2018  . IR FLUORO GUIDE CV LINE RIGHT  06/24/2019  . IR LUMBAR DISC ASPIRATION W/IMG GUIDE  05/15/2018  . IR NEPHROSTOMY EXCHANGE RIGHT  05/28/2019  . IR NEPHROSTOMY EXCHANGE RIGHT  07/19/2019  . IR NEPHROSTOMY PLACEMENT RIGHT  04/09/2019  . IR REMOVAL TUN CV CATH W/O FL  07/23/2018  . IR US GUIDE VASC ACCESS RIGHT  05/18/2018  . IR US GUIDE VASC ACCESS RIGHT  06/24/2019  . KNEE ARTHROSCOPY Bilateral   . PACEMAKER IMPLANT N/A  03/25/2018   Procedure: PACEMAKER IMPLANT;  Surgeon: Evans Lance, MD;  Location: [place] [facility];  Service: Cardiovascular;  Laterality: N/A;  . PACEMAKER PLACEMENT Left 03/2018  . POLYPECTOMY  01/28/2019   Procedure: POLYPECTOMY;  Surgeon: Mansouraty, Telford Nab., MD;  Location: [place];  Service: Gastroenterology;;  . RIGHT HEART CATH N/A 03/20/2018   Procedure: RIGHT HEART CATH;  Surgeon: Nigel Mormon, MD;  Location: [place] [facility];  Service: Cardiovascular;  Laterality: N/A;  . TEE WITHOUT CARDIOVERSION N/A 03/20/2018   Procedure: TRANSESOPHAGEAL ECHOCARDIOGRAM (TEE);  Surgeon: Nigel Mormon, MD;  Location: [facility] ENDOSCOPY;  Service: Cardiovascular;  Laterality: N/A;  . TONSILLECTOMY    . URETEROSCOPY WITH HOLMIUM LASER LITHOTRIPSY Right 01/14/2019   Procedure: URETEROSCOPY WITH HOLMIUM LASER LITHOTRIPSY BLADDER BIOPSIES RIGHT STENT PLACEMENT;  Surgeon: Ardis Hughs, MD;  Location: [facility];  Service: Urology;  Laterality: Right;   Social History:  reports that she has never smoked. She has never used smokeless tobacco. She reports previous alcohol use. She reports that she does not use drugs. Patient lives at home  Allergies  Allergen Reactions  . Baclofen Other (See Comments)    somnolence  . Codeine Other (See Comments)    Increases Pain and couldn't sleep  . Dextromethorphan-Guaifenesin Other (See Comments)    unknown    Family History  Problem Relation Age of Onset  . Hypertension Mother   . Diabetes Mellitus II Father   . Heart disease Father   . Heart attack Father   . Gastric cancer Brother   . Diabetes Mellitus II Brother   . Stomach cancer Brother       Prior to Admission medications   Medication Sig Start Date End Date Taking? Authorizing Provider  acetaminophen (TYLENOL) 325 MG tablet Take 1-2 tablets (325-650 mg total) by mouth every 4 (four) hours as needed for mild pain. 04/30/19   Love, Ivan Anchors, PA-C  B Complex-C-Zn-Folic Acid  (DIALYVITE 800 WITH ZINC) 0.8 MG TABS Take 1 tablet by mouth daily.  07/01/19   [provider]  Biotin 5 MG CAPS Take 1 capsule (5 mg total) by mouth daily. 01/14/19   Ardis Hughs, MD  cetirizine (ZYRTEC) 10 MG tablet Take 10 mg by mouth at bedtime.     [provider]  docusate sodium (COLACE) 100 MG capsule Take 1 capsule (100 mg total) by mouth 2 (two) times daily. Patient taking differently: Take 100 mg by mouth at bedtime.  04/30/19   Love, Ivan Anchors, PA-C  famotidine (PEPCID AC MAXIMUM STRENGTH) 20 MG tablet Take 20 mg by mouth at bedtime.    [provider]  ferric citrate (AURYXIA) 1 GM 210 MG(Fe) tablet Take 210 mg by mouth 3 (three) times daily with meals.    [provider]  flavoxATE (URISPAS) 100 MG tablet Take 1 tablet (100 mg total) by mouth 3 (three) times daily as needed (bladder spasms, dysuria). Patient taking differently: Take 100 mg by mouth 3 (three) times daily.  06/30/19   Shelly Coss, MD  fluticasone (FLONASE) 50 MCG/ACT nasal spray Place 2 sprays into both nostrils daily as  needed for allergies or rhinitis.    [provider]  gabapentin (NEURONTIN) 100 MG capsule Take 1 capsule (100 mg total) by mouth 3 (three) times daily. Patient taking differently: Take 100 mg by mouth at bedtime.  04/30/19   Love, Ivan Anchors, PA-C  HYDROcodone Bitartrate ER (HYSINGLA ER) 30 MG T24A Take 30 mg by mouth every evening.    [provider]  levothyroxine (SYNTHROID) 75 MCG tablet Take 1 tablet (75 mcg total) by mouth daily before breakfast. 04/30/19   Love, Ivan Anchors, PA-C  midodrine (PROAMATINE) 5 MG tablet Take 1 tablet (5 mg total) by mouth daily. Patient taking differently: Take 10 mg by mouth every Tuesday, Thursday, and Saturday at 6 PM.  06/30/19   Shelly Coss, MD  polyethylene glycol (MIRALAX / GLYCOLAX) 17 g packet Take 17 g by mouth 2 (two) times daily. Patient taking differently: Take 17 g by mouth daily as needed for  mild constipation.  05/01/19   Love, Ivan Anchors, PA-C  simvastatin (ZOCOR) 40 MG tablet Take 1 tablet (40 mg total) by mouth at bedtime. 04/21/19   Dessa Phi, DO    Physical Exam: BP (!) 111/50   Pulse 73   Temp 99.6 F (37.6 C) (Oral)   Resp 13   SpO2 100%   . General: Elderly female. Awake and alert and oriented x3. No acute cardiopulmonary distress.  [name] Kitchen HEENT: Normocephalic atraumatic.  Right and left ears normal in appearance.  Pupils equal, round, reactive to light. Extraocular muscles are intact. Sclerae anicteric and noninjected.  Moist mucosal membranes. No mucosal lesions.  . Neck: Neck supple without lymphadenopathy. No carotid bruits. No masses palpated.  . Cardiovascular: Regular rate with normal S1-S2 sounds. No murmurs, rubs, gallops auscultated. No JVD.  [name] Kitchen Respiratory: Diminished breath sounds throughout.  No wheezing.  Diffuse rales. No accessory muscle use. . Abdomen: Soft, nontender, nondistended. Active bowel sounds. No masses or hepatosplenomegaly  . Skin: No rashes, lesions, or ulcerations.  Dry, warm to touch. 2+ dorsalis pedis and radial pulses. . Musculoskeletal: No calf or leg pain. All major joints not erythematous nontender.  No upper or lower joint deformation.  Good ROM.  No contractures  . Psychiatric: Intact judgment and insight. Pleasant and cooperative. . Neurologic: No focal neurological deficits. Strength is 5/5 and symmetric in upper and lower extremities.  Cranial nerves II through XII are grossly intact.           Labs on Admission: I have personally reviewed following labs and imaging studies  CBC: Recent Labs  Lab 12/25/19 1101 12/25/19 1157  WBC 3.5*  --   NEUTROABS  --  2.9  HGB 13.5  --   HCT 42.7  --   MCV 89.0  --   PLT PLATELET CLUMPS NOTED ON SMEAR, COUNT APPEARS DECREASED  --    Basic Metabolic Panel: Recent Labs  Lab 12/25/19 1101  NA 135  K 4.0  CL 89*  CO2 28  GLUCOSE 125*  BUN 39*  CREATININE 7.24*  CALCIUM 8.7*    GFR: CrCl cannot be calculated (Unknown ideal weight.). Liver Function Tests: Recent Labs  Lab 12/25/19 1157  AST 23  ALT 14  ALKPHOS 104  BILITOT 0.8  PROT 6.9  ALBUMIN 2.9*   No results for input(s): LIPASE, AMYLASE in the last 168 hours. No results for input(s): AMMONIA in the last 168 hours. Coagulation Profile: No results for input(s): INR, PROTIME in the last 168 hours. Cardiac Enzymes: No results for  input(s): CKTOTAL, CKMB, CKMBINDEX, TROPONINI in the last 168 hours. BNP (last 3 results) No results for input(s): PROBNP in the last 8760 hours. HbA1C: No results for input(s): HGBA1C in the last 72 hours. CBG: No results for input(s): GLUCAP in the last 168 hours. Lipid Profile: Recent Labs    12/25/19 1157  TRIG 194*   Thyroid Function Tests: No results for input(s): TSH, T4TOTAL, FREET4, T3FREE, THYROIDAB in the last 72 hours. Anemia Panel: Recent Labs    12/25/19 1157  FERRITIN 1,192*   Urine analysis:    Component Value Date/Time   COLORURINE YELLOW 12/25/2019 1131   APPEARANCEUR CLOUDY (A) 12/25/2019 1131   LABSPEC 1.020 12/25/2019 1131   PHURINE 8.5 (H) 12/25/2019 1131   GLUCOSEU NEGATIVE 12/25/2019 1131   HGBUR MODERATE (A) 12/25/2019 [place] 12/25/2019 [place] 12/25/2019 1131   PROTEINUR >300 (A) 12/25/2019 1131   NITRITE NEGATIVE 12/25/2019 1131   LEUKOCYTESUR MODERATE (A) 12/25/2019 1131   Sepsis Labs: @LABRCNTIP (procalcitonin:4,lacticidven:4) )No results found for this or any previous visit (from the past 240 hour(s)).   Radiological Exams on Admission: DG Chest Port 1 View  Result Date: 12/25/2019 CLINICAL DATA:  74 year old female with shortness of breath EXAM: PORTABLE CHEST 1 VIEW COMPARISON:  06/20/2019 FINDINGS: Cardiomediastinal silhouette unchanged in size and contour. Pacing device on the left chest wall with 2 leads in place. No pneumothorax. Coarsened interstitial markings of the  bilateral lungs similar to the comparison. Persistent opacity at the left lung base. Mild interlobular septal thickening. IMPRESSION: Chronic changes of the lungs with mild interlobular septal thickening, potentially representing early edema/CHF versus chronic changes in the setting of prior episodes of CHF. Left basilar opacity is likely scarring/atelectasis given the persistence. Unchanged cardiac pacing device. Electronically Signed   By: Corrie Mckusick D.O.   On: 12/25/2019 12:33    EKG: Independently reviewed.  Ventricular paced rhythm.  Assessment/Plan: Principal Problem:   Acute respiratory disease due to COVID-19 virus Active Problems:   Diet-controlled diabetes mellitus (HCC)   GERD (gastroesophageal reflux disease)   Essential hypertension   Yeast UTI   Benign essential HTN   Chronic heart failure with preserved ejection fraction (HCC)   ESRD (end stage renal disease) on dialysis [facility])    This patient was discussed with the ED physician, including pertinent vitals, physical exam findings, labs, and imaging.  We also discussed care given by the ED provider.  1. Acute respiratory disease due to Covid a. Admit b. Telemetry monitoring c. Oxygen d. Dexamethasone e. Remdesivir f. Daily labs: CBC, CRP, procalcitonin, CRP, D-dimer 2. End-stage renal disease on dialysis a. Neurology consulted for dialysis 3. Diet-controlled diabetes a. Sliding scale insulin 4. Hypertension a. Midodrine post dialysis 5. Yeast UTI a. Fluconazole 200 mg 3 times a day after dialysis b. Urine culture 6. Chronic heart failure with preserved EF a. Appears compensated 7. GERD a. Continue PPI  DVT prophylaxis: Heparin Consultants: Nephrology Code Status: Full code Family Communication: None Disposition Plan: Pending   Truett Mainland, DO

## 2019-12-25 NOTE — Consult Note (Addendum)
[facility] Renal Consultation Note    Indication for Consultation:  Management of ESRD/hemodialysis; anemia, hypertension/volume and secondary hyperparathyroidism PCP: Dr. Rockwell Alexandria ([place])  HPI: Erika Fry is a 74 y.o. female with ESRD on hemodialysis T,Th, S at [facility] PMH : DMT2, HFpEF, Interstitial cystitis, UTIs, hydronephrosis, AKI, hypothyroidism, hypomagnesemia, cellulitis BLE, PPM for Mobitz 2, AOCD, SHPT. She is compliant with HD prescription. Last HD off schedule 12/24/2019 which was a make up visit D/T feeling too ill to attend regularly scheduled HD 12/23/2019.   Patient has been treated for sinusitis on Augmentin for past two weeks. Over past few days, per her daughter-in-law, she has grown progressively weaker with loss of appetite. EDW was adjusted earlier this week D/T complaints of weakness, feeling awful post HD. Symptoms did not improve and she went to PCP 12/25/2019 where she tested positive for COVID 19, CXR done by PCP reportedly showed PNA. This morning, she felt worse, O2 sats at home 92% with increased fatigue, mild SOB, loss of appetite and lethargy. She was brought to ED for evaluation. Upon arrival to ED, BMET noted to be unremarkable to ESRD patient. WBC 3.5 HGB 13.5 BS 125. COVD POC positive. UA with moderate leuks, nitrite neg. CXR suggestive of early edema/CHF vs chronic changes in setting of prior episodes of CHF.   On exam, she looks surprisingly well. She denies SOB at present, says she feels weak, no appetite. Has persistent cough with green sputum. ED calling hospitalist for admit. She will have HD on schedule when she is assigned to her room.    Past Medical History:  Diagnosis Date  . Arthritis    "knees, thumbs" (03/25/2018)  . AV block, Mobitz 2 03/25/2018  . CKD (chronic kidney disease), stage IV ([place])   . Complication of anesthesia    Reports had hard time waking up in the past  .  Diet-controlled diabetes mellitus ([place])   . Encounter for care of pacemaker 09/07/2019  . End stage renal failure on dialysis ([place])    T, Th, Sat [employer]  . GERD (gastroesophageal reflux disease)   . Gout    "on daily RX" (03/25/2018)  . Heart murmur   . High cholesterol   . Hypertension   . Hypothyroidism   . Iron deficiency anemia    "had to get an iron infusion"  . Migraine    "used to have them growing up" (03/25/2018)  . Presence of permanent cardiac pacemaker 03/25/2018   Past Surgical History:  Procedure Laterality Date  . APPENDECTOMY    . AV FISTULA PLACEMENT Right 06/29/2019   Procedure: ARTERIOVENOUS (AV) FISTULA CREATION RIGHT UPPER  ARM;  Surgeon: Waynetta Sandy, MD;  Location: [place];  Service: Vascular;  Laterality: Right;  . BASCILIC VEIN TRANSPOSITION Right 09/08/2019   Procedure: BASILIC VEIN TRANSPOSITION RIGHT SECOND STAGE;  Surgeon: Waynetta Sandy, MD;  Location: [place];  Service: Vascular;  Laterality: Right;  . BIOPSY  05/19/2018   Procedure: BIOPSY;  Surgeon: Ladene Artist, MD;  Location: [place];  Service: Endoscopy;;  . Consuela Mimes W/ RETROGRADES Bilateral 01/14/2019   Procedure: CYSTOSCOPY WITH RETROGRADE PYELOGRAM BILATERAL HYDRODISTENTION;  Surgeon: Ardis Hughs, MD;  Location: [facility];  Service: Urology;  Laterality: Bilateral;  . ESOPHAGOGASTRODUODENOSCOPY N/A 05/19/2018   Procedure: ESOPHAGOGASTRODUODENOSCOPY (EGD);  Surgeon: Ladene Artist, MD;  Location: [facility] ENDOSCOPY;  Service: Endoscopy;  Laterality: N/A;  . ESOPHAGOGASTRODUODENOSCOPY (EGD) WITH PROPOFOL N/A 01/28/2019   Procedure:  ESOPHAGOGASTRODUODENOSCOPY (EGD) WITH PROPOFOL;  Surgeon: Mansouraty, Telford Nab., MD;  Location: [place];  Service: Gastroenterology;  Laterality: N/A;  . INSERT / REPLACE / REMOVE PACEMAKER  03/25/2018  . IR FLUORO GUIDE CV LINE RIGHT  05/18/2018  . IR FLUORO GUIDE CV LINE RIGHT  06/24/2019  . IR LUMBAR DISC ASPIRATION W/IMG GUIDE   05/15/2018  . IR NEPHROSTOMY EXCHANGE RIGHT  05/28/2019  . IR NEPHROSTOMY EXCHANGE RIGHT  07/19/2019  . IR NEPHROSTOMY PLACEMENT RIGHT  04/09/2019  . IR REMOVAL TUN CV CATH W/O FL  07/23/2018  . IR US GUIDE VASC ACCESS RIGHT  05/18/2018  . IR US GUIDE VASC ACCESS RIGHT  06/24/2019  . KNEE ARTHROSCOPY Bilateral   . PACEMAKER IMPLANT N/A 03/25/2018   Procedure: PACEMAKER IMPLANT;  Surgeon: Evans Lance, MD;  Location: [place] CV [facility];  Service: Cardiovascular;  Laterality: N/A;  . PACEMAKER PLACEMENT Left 03/2018  . POLYPECTOMY  01/28/2019   Procedure: POLYPECTOMY;  Surgeon: Mansouraty, Telford Nab., MD;  Location: [place];  Service: Gastroenterology;;  . RIGHT HEART CATH N/A 03/20/2018   Procedure: RIGHT HEART CATH;  Surgeon: Nigel Mormon, MD;  Location: [place] [facility];  Service: Cardiovascular;  Laterality: N/A;  . TEE WITHOUT CARDIOVERSION N/A 03/20/2018   Procedure: TRANSESOPHAGEAL ECHOCARDIOGRAM (TEE);  Surgeon: Nigel Mormon, MD;  Location: [facility] ENDOSCOPY;  Service: Cardiovascular;  Laterality: N/A;  . TONSILLECTOMY    . URETEROSCOPY WITH HOLMIUM LASER LITHOTRIPSY Right 01/14/2019   Procedure: URETEROSCOPY WITH HOLMIUM LASER LITHOTRIPSY BLADDER BIOPSIES RIGHT STENT PLACEMENT;  Surgeon: Ardis Hughs, MD;  Location: [facility];  Service: Urology;  Laterality: Right;   Family History  Problem Relation Age of Onset  . Hypertension Mother   . Diabetes Mellitus II Father   . Heart disease Father   . Heart attack Father   . Gastric cancer Brother   . Diabetes Mellitus II Brother   . Stomach cancer Brother    Social History:  reports that she has never smoked. She has never used smokeless tobacco. She reports previous alcohol use. She reports that she does not use drugs. Allergies  Allergen Reactions  . Baclofen Other (See Comments)    somnolence  . Codeine Other (See Comments)    Increases Pain and couldn't sleep  . Dextromethorphan-Guaifenesin Other (See  Comments)    unknown   Prior to Admission medications   Medication Sig Start Date End Date Taking? Authorizing Provider  acetaminophen (TYLENOL) 325 MG tablet Take 1-2 tablets (325-650 mg total) by mouth every 4 (four) hours as needed for mild pain. 04/30/19   Love, Ivan Anchors, PA-C  B Complex-C-Zn-Folic Acid (DIALYVITE 109 WITH ZINC) 0.8 MG TABS Take 1 tablet by mouth daily.  07/01/19   [provider]  Biotin 5 MG CAPS Take 1 capsule (5 mg total) by mouth daily. 01/14/19   Ardis Hughs, MD  cetirizine (ZYRTEC) 10 MG tablet Take 10 mg by mouth at bedtime.     [provider]  docusate sodium (COLACE) 100 MG capsule Take 1 capsule (100 mg total) by mouth 2 (two) times daily. Patient taking differently: Take 100 mg by mouth at bedtime.  04/30/19   Love, Ivan Anchors, PA-C  famotidine (PEPCID AC MAXIMUM STRENGTH) 20 MG tablet Take 20 mg by mouth at bedtime.    [provider]  ferric citrate (AURYXIA) 1 GM 210 MG(Fe) tablet Take 210 mg by mouth 3 (three) times daily with meals.    [provider]  flavoxATE (URISPAS) 100 MG tablet Take 1 tablet (100 mg total) by mouth 3 (three) times daily as needed (bladder spasms, dysuria). Patient taking differently: Take 100 mg by mouth 3 (three) times daily.  06/30/19   Shelly Coss, MD  fluticasone (FLONASE) 50 MCG/ACT nasal spray Place 2 sprays into both nostrils daily as needed for allergies or rhinitis.    [provider]  gabapentin (NEURONTIN) 100 MG capsule Take 1 capsule (100 mg total) by mouth 3 (three) times daily. Patient taking differently: Take 100 mg by mouth at bedtime.  04/30/19   Love, Ivan Anchors, PA-C  HYDROcodone Bitartrate ER (HYSINGLA ER) 30 MG T24A Take 30 mg by mouth every evening.    [provider]  levothyroxine (SYNTHROID) 75 MCG tablet Take 1 tablet (75 mcg total) by mouth daily before breakfast. 04/30/19   Love, Ivan Anchors, PA-C  midodrine (PROAMATINE) 5 MG tablet Take 1 tablet (5 mg  total) by mouth daily. Patient taking differently: Take 10 mg by mouth every Tuesday, Thursday, and Saturday at 6 PM.  06/30/19   Shelly Coss, MD  polyethylene glycol (MIRALAX / GLYCOLAX) 17 g packet Take 17 g by mouth 2 (two) times daily. Patient taking differently: Take 17 g by mouth daily as needed for mild constipation.  05/01/19   Love, Ivan Anchors, PA-C  simvastatin (ZOCOR) 40 MG tablet Take 1 tablet (40 mg total) by mouth at bedtime. 04/21/19   Dessa Phi, DO   Current Facility-Administered Medications  Medication Dose Route Frequency Provider Last Rate Last Admin  . dexamethasone (DECADRON) tablet 6 mg  6 mg Oral Daily Truett Mainland, DO       Current Outpatient Medications  Medication Sig Dispense Refill  . acetaminophen (TYLENOL) 325 MG tablet Take 1-2 tablets (325-650 mg total) by mouth every 4 (four) hours as needed for mild pain.    . B Complex-C-Zn-Folic Acid (DIALYVITE 536 WITH ZINC) 0.8 MG TABS Take 1 tablet by mouth daily.     . Biotin 5 MG CAPS Take 1 capsule (5 mg total) by mouth daily.  0  . cetirizine (ZYRTEC) 10 MG tablet Take 10 mg by mouth at bedtime.     . docusate sodium (COLACE) 100 MG capsule Take 1 capsule (100 mg total) by mouth 2 (two) times daily. (Patient taking differently: Take 100 mg by mouth at bedtime. ) 60 capsule 0  . famotidine (PEPCID AC MAXIMUM STRENGTH) 20 MG tablet Take 20 mg by mouth at bedtime.    . ferric citrate (AURYXIA) 1 GM 210 MG(Fe) tablet Take 210 mg by mouth 3 (three) times daily with meals.    . flavoxATE (URISPAS) 100 MG tablet Take 1 tablet (100 mg total) by mouth 3 (three) times daily as needed (bladder spasms, dysuria). (Patient taking differently: Take 100 mg by mouth 3 (three) times daily. ) 30 tablet 0  . fluticasone (FLONASE) 50 MCG/ACT nasal spray Place 2 sprays into both nostrils daily as needed for allergies or rhinitis.    [name] Kitchen gabapentin (NEURONTIN) 100 MG capsule Take 1 capsule (100 mg total) by mouth 3 (three) times daily.  (Patient taking differently: Take 100 mg by mouth at bedtime. ) 90 capsule 1  . HYDROcodone Bitartrate ER (HYSINGLA ER) 30 MG T24A Take 30 mg by mouth every evening.    [name] Kitchen levothyroxine (SYNTHROID) 75 MCG tablet Take 1 tablet (75 mcg total) by mouth daily before breakfast. 30 tablet 1  . midodrine (PROAMATINE) 5 MG tablet Take 1  tablet (5 mg total) by mouth daily. (Patient taking differently: Take 10 mg by mouth every Tuesday, Thursday, and Saturday at 6 PM. ) 30 tablet 0  . polyethylene glycol (MIRALAX / GLYCOLAX) 17 g packet Take 17 g by mouth 2 (two) times daily. (Patient taking differently: Take 17 g by mouth daily as needed for mild constipation. ) 60 each 0  . simvastatin (ZOCOR) 40 MG tablet Take 1 tablet (40 mg total) by mouth at bedtime.     Labs: Basic Metabolic Panel: Recent Labs  Lab 12/25/19 1101  NA 135  K 4.0  CL 89*  CO2 28  GLUCOSE 125*  BUN 39*  CREATININE 7.24*  CALCIUM 8.7*   Liver Function Tests: Recent Labs  Lab 12/25/19 1157  AST 23  ALT 14  ALKPHOS 104  BILITOT 0.8  PROT 6.9  ALBUMIN 2.9*   No results for input(s): LIPASE, AMYLASE in the last 168 hours. No results for input(s): AMMONIA in the last 168 hours. CBC: Recent Labs  Lab 12/25/19 1101 12/25/19 1157  WBC 3.5*  --   NEUTROABS  --  2.9  HGB 13.5  --   HCT 42.7  --   MCV 89.0  --   PLT PLATELET CLUMPS NOTED ON SMEAR, COUNT APPEARS DECREASED  --    Cardiac Enzymes: No results for input(s): CKTOTAL, CKMB, CKMBINDEX, TROPONINI in the last 168 hours. CBG: No results for input(s): GLUCAP in the last 168 hours. Iron Studies: No results for input(s): IRON, TIBC, TRANSFERRIN, FERRITIN in the last 72 hours. Studies/Results: DG Chest Port 1 View  Result Date: 12/25/2019 CLINICAL DATA:  74 year old female with shortness of breath EXAM: PORTABLE CHEST 1 VIEW COMPARISON:  06/20/2019 FINDINGS: Cardiomediastinal silhouette unchanged in size and contour. Pacing device on the left chest wall with 2  leads in place. No pneumothorax. Coarsened interstitial markings of the bilateral lungs similar to the comparison. Persistent opacity at the left lung base. Mild interlobular septal thickening. IMPRESSION: Chronic changes of the lungs with mild interlobular septal thickening, potentially representing early edema/CHF versus chronic changes in the setting of prior episodes of CHF. Left basilar opacity is likely scarring/atelectasis given the persistence. Unchanged cardiac pacing device. Electronically Signed   By: Corrie Mckusick D.O.   On: 12/25/2019 12:33    ROS: As per HPI otherwise negative.   Physical Exam: Vitals:   12/25/19 1131 12/25/19 1300 12/25/19 1315 12/25/19 1330  BP: 108/65 103/60 (!) 111/50   Pulse: 80 85  73  Resp: 14 17  13   Temp:      TempSrc:      SpO2: 95% 100%       General: Pleasant elderly female in no acute distress. Head: Normocephalic, atraumatic, sclera non-icteric, mucus membranes are moist Neck: Supple. JVD not elevated. Lungs: Decreased breath sounds throughout lung fields bilaterally to auscultation without wheezes, rales, or rhonchi. Breathing is unlabored. Heart: RRR with S1 S2. No murmurs, rubs, or gallops appreciated. SR on monitor.  Abdomen: Soft, non-tender, non-distended with normoactive bowel sounds. No rebound/guarding. No obvious abdominal masses. M-S:  Strength and tone appear normal for age. Lower extremities:BLE ace wrapped Trace-1+ BLE edema.  Neuro: Alert and oriented X 3. Moves all extremities spontaneously. Psych:  Responds to questions appropriately with a normal affect. Dialysis Access: R AVF + bruit  Dialysis Orders: [place] T,Th,S 4 hrs 180NRe 400/800 80 kg 2.0 K/ 2.25 Ca R AVF -Heparin 1500 units IV TIW -Hectorol 2 mcg IV TIW -Senispar 30 mg PO TIW  Assessment/Plan: 1.  COVID 19: Tested positive as OP 12/24/2019. Arrangements have been made at OP HD clinic for dialysis at [facility]. Has rec'd Decadron so far. Per primary 2.  Volume  Overload-has not been eating well, losing weight. Missed HD 01/21 D/T being too weak to come to dialysis. Did have make up treatment 12/24/19, left 1.1 kg under OP EDW (EDW was raised earlier this week) Lower volume as tolerated. O2 sats 90-94 on RA 3. Interstitial Cystitis-controlled with Hysingla ER 30mg  q 4 hrs which she takes around the clock, otherwise she cannot sit long enough for hemodialysis.  4.  ESRD -  T,Th, S via AVF. HD today on schedule. K+ 4.0. 2.0 K bath, tight heparin.  5.  Hypotension/volume  - Chronic hypotension on Midodrine 10 mg PO prior to HD TIW. Chronic BLE edema-BLE ace wraps at present. UF as tolerated, never has high gains but difficulty reaching EDW D/T hypotension.  6.  Anemia  -HGB 13.5 No ESA/Fe at OP center. Follow HGB 7.  Metabolic bone disease - C Ca 9.5. Continue Auryxia binders, VDRA, Sensipar.  8.  Nutrition - Albumin low. Has not been eating well this week. NPO at present. Renal diet, renal vit, prostat.  8.    DM-per primary 9.    Hypothyroidism-per primary  Jimmye Norman. Owens Shark, NP-C 12/25/2019, 1:40 PM  [place]

## 2019-12-25 NOTE — ED Triage Notes (Signed)
Pt to triage via [facility].  Dialysis pt with COVID symptoms x 10 days.  Yesterday pt was diagnosed with COVID and pneumonia.  C/o generalized weakness and nasal congestion.  Next dialysis was due today.  Pt denies SOB.

## 2019-12-25 NOTE — Plan of Care (Signed)
Patient admitted from home via ED with COVID pneumonia. COVID-19 careplan initiated.

## 2019-12-25 NOTE — ED Provider Notes (Signed)
[facility] EMERGENCY DEPARTMENT Provider Note   CSN: [id number] Arrival date & time: 12/25/19  1045     History Chief Complaint  Patient presents with  . COVID +  . Weakness    Erika Fry is a 74 y.o. female.  HPI Patient presents with weakness and Covid infection.  States around 2 weeks she has had sinus symptoms and has been on antibiotics.  States she not gotten better.  States she follow-up with [facility] her doctors yesterday.  States she had a positive Covid test from them.  States she also had an x-ray told her she had pneumonia.  She is a dialysis patient was due for dialysis today, however was unable to go to dialysis because she was too weak.  Has had a decreased appetite.  No current fevers but states she has been coughing up green sputum.  States she aches all over.  She is usually dialyzed at around 11 AM.    Past Medical History:  Diagnosis Date  . Arthritis    "knees, thumbs" (03/25/2018)  . AV block, Mobitz 2 03/25/2018  . CKD (chronic kidney disease), stage IV ([place])   . Complication of anesthesia    Reports had hard time waking up in the past  . Diet-controlled diabetes mellitus ([place])   . Encounter for care of pacemaker 09/07/2019  . End stage renal failure on dialysis ([place])    T, Th, Sat [employer]  . GERD (gastroesophageal reflux disease)   . Gout    "on daily RX" (03/25/2018)  . Heart murmur   . High cholesterol   . Hypertension   . Hypothyroidism   . Iron deficiency anemia    "had to get an iron infusion"  . Migraine    "used to have them growing up" (03/25/2018)  . Presence of permanent cardiac pacemaker 03/25/2018    Patient Active Problem List   Diagnosis Date Noted  . Acute respiratory disease due to COVID-19 virus 12/25/2019  . Encounter for care of pacemaker 09/07/2019  . ESRD (end stage renal disease) on dialysis ([place])   . Pain in both lower extremities   . Hypomagnesemia 06/11/2019  . DNR (do not  resuscitate) discussion   . Cystitis   . Acute on chronic diastolic CHF (congestive heart failure) ([place]) 06/10/2019  . Chronic diastolic CHF (congestive heart failure) ([place]) 06/09/2019  . Cellulitis of lower extremity 06/09/2019  . Bladder pain   . Hyponatremia   . Acute on chronic anemia   . Diabetes mellitus type 2 in obese ([place])   . Chronic kidney disease   . Drug induced constipation   . Neurogenic bladder   . Weakness generalized 04/21/2019  . Acquired hydronephrosis with ureteropelvic junction (UPJ) obstruction   . Pain in joint involving pelvic region and thigh   . Dysuria   . Palliative care by specialist   . AKI (acute kidney injury) ([place]) 04/10/2019  . Hydronephrosis 04/09/2019  . Interstitial cystitis 04/09/2019  . Bilateral leg edema 02/25/2019  . Chronic heart failure with preserved ejection fraction ([place]) 02/24/2019  . Pacemaker Medtronic Azure XT DR MRI Dual Chamber Pacemaker 03/25/2018 02/24/2019  . ARF (acute renal failure) ([place]) 02/10/2019  . Peripheral edema   . Fever 01/17/2019  . Acute lower UTI 01/17/2019  . Pressure injury of skin 01/17/2019  . Gross hematuria 01/14/2019  . Fluid retention   . Generalized edema   . Hypokalemia   . Acute right-sided low back pain with  right-sided sciatica   . Acute blood loss anemia   . Anemia of chronic disease   . Thrombocytopenia ([place])   . Diabetes mellitus ([place])   . Morbid obesity ([place])   . Benign essential HTN   . Hypoalbuminemia due to protein-calorie malnutrition ([place])   . Occult blood in stools   . Gastric polyp   . Lumbar discitis 05/17/2018  . Blood loss anemia 05/12/2018  . Hyperlipidemia associated with type 2 diabetes mellitus ([place]) 05/10/2018  . Yeast UTI 04/30/2018  . Essential hypertension 04/29/2018  . Chronic kidney disease (CKD), stage IV (severe) ([place])   . Diet-controlled diabetes mellitus ([place])   . GERD (gastroesophageal reflux disease)   . Gout   . High cholesterol   . Hypothyroidism   .  Iron deficiency anemia   . AV block, Mobitz 2 03/25/2018  . Mobitz type 2 second degree AV block 03/20/2018  . Exertional dyspnea 03/19/2018    Past Surgical History:  Procedure Laterality Date  . APPENDECTOMY    . AV FISTULA PLACEMENT Right 06/29/2019   Procedure: ARTERIOVENOUS (AV) FISTULA CREATION RIGHT UPPER  ARM;  Surgeon: Waynetta Sandy, MD;  Location: [facility];  Service: Vascular;  Laterality: Right;  . BASCILIC VEIN TRANSPOSITION Right 09/08/2019   Procedure: BASILIC VEIN TRANSPOSITION RIGHT SECOND STAGE;  Surgeon: Waynetta Sandy, MD;  Location: [facility];  Service: Vascular;  Laterality: Right;  . BIOPSY  05/19/2018   Procedure: BIOPSY;  Surgeon: Ladene Artist, MD;  Location: [facility];  Service: Endoscopy;;  . Consuela Mimes W/ RETROGRADES Bilateral 01/14/2019   Procedure: CYSTOSCOPY WITH RETROGRADE PYELOGRAM BILATERAL HYDRODISTENTION;  Surgeon: Ardis Hughs, MD;  Location: [facility];  Service: Urology;  Laterality: Bilateral;  . ESOPHAGOGASTRODUODENOSCOPY N/A 05/19/2018   Procedure: ESOPHAGOGASTRODUODENOSCOPY (EGD);  Surgeon: Ladene Artist, MD;  Location[facility] ENDOSCOPY;  Service: Endoscopy;  Laterality: N/A;  . ESOPHAGOGASTRODUODENOSCOPY (EGD) WITH PROPOFOL N/A 01/28/2019   Procedure: ESOPHAGOGASTRODUODENOSCOPY (EGD) WITH PROPOFOL;  Surgeon: Rush Landmark Telford Nab., MD;  Location: [facility];  Service: Gastroenterology;  Laterality: N/A;  . INSERT / REPLACE / REMOVE PACEMAKER  03/25/2018  . IR FLUORO GUIDE CV LINE RIGHT  05/18/2018  . IR FLUORO GUIDE CV LINE RIGHT  06/24/2019  . IR LUMBAR DISC ASPIRATION W/IMG GUIDE  05/15/2018  . IR NEPHROSTOMY EXCHANGE RIGHT  05/28/2019  . IR NEPHROSTOMY EXCHANGE RIGHT  07/19/2019  . IR NEPHROSTOMY PLACEMENT RIGHT  04/09/2019  . IR REMOVAL TUN CV CATH W/O FL  07/23/2018  . IR US GUIDE VASC ACCESS RIGHT  05/18/2018  . IR US GUIDE VASC ACCESS RIGHT  06/24/2019  . KNEE ARTHROSCOPY Bilateral   . PACEMAKER IMPLANT N/A 03/25/2018    Procedure: PACEMAKER IMPLANT;  Surgeon: Evans Lance, MD;  Location: [facility] CV LAB;  Service: Cardiovascular;  Laterality: N/A;  . PACEMAKER PLACEMENT Left 03/2018  . POLYPECTOMY  01/28/2019   Procedure: POLYPECTOMY;  Surgeon: Mansouraty, Telford Nab., MD;  Location: [facility];  Service: Gastroenterology;;  . RIGHT HEART CATH N/A 03/20/2018   Procedure: RIGHT HEART CATH;  Surgeon: Nigel Mormon, MD;  Location: [facility];  Service: Cardiovascular;  Laterality: N/A;  . TEE WITHOUT CARDIOVERSION N/A 03/20/2018   Procedure: TRANSESOPHAGEAL ECHOCARDIOGRAM (TEE);  Surgeon: Nigel Mormon, MD;  Location[facility] ENDOSCOPY;  Service: Cardiovascular;  Laterality: N/A;  . TONSILLECTOMY    . URETEROSCOPY WITH HOLMIUM LASER LITHOTRIPSY Right 01/14/2019   Procedure: URETEROSCOPY WITH HOLMIUM LASER LITHOTRIPSY BLADDER BIOPSIES RIGHT STENT PLACEMENT;  Surgeon:  Ardis Hughs, MD;  Location[facility];  Service: Urology;  Laterality: Right;     OB History   No obstetric history on file.     Family History  Problem Relation Age of Onset  . Hypertension Mother   . Diabetes Mellitus II Father   . Heart disease Father   . Heart attack Father   . Gastric cancer Brother   . Diabetes Mellitus II Brother   . Stomach cancer Brother     Social History   Tobacco Use  . Smoking status: Never Smoker  . Smokeless tobacco: Never Used  Substance Use Topics  . Alcohol use: Not Currently  . Drug use: Never    Home Medications Prior to Admission medications   Medication Sig Start Date End Date Taking? Authorizing Provider  acetaminophen (TYLENOL) 325 MG tablet Take 1-2 tablets (325-650 mg total) by mouth every 4 (four) hours as needed for mild pain. 04/30/19   Love, Ivan Anchors, PA-C  B Complex-C-Zn-Folic Acid (DIALYVITE 540 WITH ZINC) 0.8 MG TABS Take 1 tablet by mouth daily.  07/01/19   [provider]  Biotin 5 MG CAPS Take 1 capsule (5 mg total) by mouth daily. 01/14/19    Ardis Hughs, MD  cetirizine (ZYRTEC) 10 MG tablet Take 10 mg by mouth at bedtime.     [provider]  docusate sodium (COLACE) 100 MG capsule Take 1 capsule (100 mg total) by mouth 2 (two) times daily. Patient taking differently: Take 100 mg by mouth at bedtime.  04/30/19   Love, Ivan Anchors, PA-C  famotidine (PEPCID AC MAXIMUM STRENGTH) 20 MG tablet Take 20 mg by mouth at bedtime.    [provider]  ferric citrate (AURYXIA) 1 GM 210 MG(Fe) tablet Take 210 mg by mouth 3 (three) times daily with meals.    [provider]  flavoxATE (URISPAS) 100 MG tablet Take 1 tablet (100 mg total) by mouth 3 (three) times daily as needed (bladder spasms, dysuria). Patient taking differently: Take 100 mg by mouth 3 (three) times daily.  06/30/19   Shelly Coss, MD  fluticasone (FLONASE) 50 MCG/ACT nasal spray Place 2 sprays into both nostrils daily as needed for allergies or rhinitis.    [provider]  gabapentin (NEURONTIN) 100 MG capsule Take 1 capsule (100 mg total) by mouth 3 (three) times daily. Patient taking differently: Take 100 mg by mouth at bedtime.  04/30/19   Love, Ivan Anchors, PA-C  HYDROcodone Bitartrate ER (HYSINGLA ER) 30 MG T24A Take 30 mg by mouth every evening.    [provider]  levothyroxine (SYNTHROID) 75 MCG tablet Take 1 tablet (75 mcg total) by mouth daily before breakfast. 04/30/19   Love, Ivan Anchors, PA-C  midodrine (PROAMATINE) 5 MG tablet Take 1 tablet (5 mg total) by mouth daily. Patient taking differently: Take 10 mg by mouth every Tuesday, Thursday, and Saturday at 6 PM.  06/30/19   Shelly Coss, MD  polyethylene glycol (MIRALAX / GLYCOLAX) 17 g packet Take 17 g by mouth 2 (two) times daily. Patient taking differently: Take 17 g by mouth daily as needed for mild constipation.  05/01/19   Love, Ivan Anchors, PA-C  simvastatin (ZOCOR) 40 MG tablet Take 1 tablet (40 mg total) by mouth at bedtime. 04/21/19   Dessa Phi, DO     Allergies    Baclofen, Codeine, and Dextromethorphan-guaifenesin  Review of Systems   Review of Systems  Constitutional: Positive for appetite change and fatigue.  HENT:  Positive for congestion.   Respiratory: Positive for cough and shortness of breath.   Cardiovascular: Negative for chest pain.  Gastrointestinal: Negative for abdominal pain.  Genitourinary: Negative for flank pain.  Musculoskeletal: Positive for myalgias.  Skin: Negative for rash.  Neurological: Negative for weakness.  Psychiatric/Behavioral: Negative for confusion.    Physical Exam Updated Vital Signs BP (!) 109/59 (BP Location: Right Arm)   Pulse 78   Temp 98.2 F (36.8 C) (Oral)   Resp 18   Ht 5\' 5"  (1.651 m)   Wt 81.6 kg Comment: bed weight  SpO2 93%   BMI 29.94 kg/m   Physical Exam Vitals and nursing note reviewed.  HENT:     Head: Atraumatic.  Eyes:     Pupils: Pupils are equal, round, and reactive to light.  Cardiovascular:     Rate and Rhythm: Regular rhythm.  Pulmonary:     Effort: No respiratory distress.  Chest:     Chest wall: No tenderness.  Abdominal:     General: There is no distension.  Musculoskeletal:        General: Tenderness present.     Cervical back: Neck supple.     Comments: Some tenderness bilateral lower extremities.  Dialysis access to right upper extremity.  Skin:    General: Skin is warm.  Neurological:     Mental Status: She is alert. Mental status is at baseline.     ED Results / Procedures / Treatments   Labs (all labs ordered are listed, but only abnormal results are displayed) Labs Reviewed  BASIC METABOLIC PANEL - Abnormal; Notable for the following components:      Result Value   Chloride 89 (*)    Glucose, Bld 125 (*)    BUN 39 (*)    Creatinine, Ser 7.24 (*)    Calcium 8.7 (*)    GFR calc non Af Amer 5 (*)    GFR calc Af Amer 6 (*)    Anion gap 18 (*)    All other components within normal limits  CBC - Abnormal; Notable for the  following components:   WBC 3.5 (*)    All other components within normal limits  URINALYSIS, ROUTINE W REFLEX MICROSCOPIC - Abnormal; Notable for the following components:   APPearance CLOUDY (*)    pH 8.5 (*)    Hgb urine dipstick MODERATE (*)    Protein, ur >300 (*)    Leukocytes,Ua MODERATE (*)    All other components within normal limits  D-DIMER, QUANTITATIVE (NOT AT [facility]) - Abnormal; Notable for the following components:   D-Dimer, Quant 7.43 (*)    All other components within normal limits  LACTATE DEHYDROGENASE - Abnormal; Notable for the following components:   LDH 198 (*)    All other components within normal limits  FERRITIN - Abnormal; Notable for the following components:   Ferritin 1,192 (*)    All other components within normal limits  FIBRINOGEN - Abnormal; Notable for the following components:   Fibrinogen 657 (*)    All other components within normal limits  C-REACTIVE PROTEIN - Abnormal; Notable for the following components:   CRP 21.0 (*)    All other components within normal limits  HEPATIC FUNCTION PANEL - Abnormal; Notable for the following components:   Albumin 2.9 (*)    All other components within normal limits  DIFFERENTIAL - Abnormal; Notable for the following components:   Lymphs Abs 0.6 (*)    All other components within normal  limits  URINALYSIS, MICROSCOPIC (REFLEX) - Abnormal; Notable for the following components:   Bacteria, UA FEW (*)    All other components within normal limits  TRIGLYCERIDES - Abnormal; Notable for the following components:   Triglycerides 194 (*)    All other components within normal limits  POC SARS CORONAVIRUS 2 AG -  ED - Abnormal; Notable for the following components:   SARS Coronavirus 2 Ag POSITIVE (*)    All other components within normal limits  CULTURE, BLOOD (ROUTINE X 2)  CULTURE, BLOOD (ROUTINE X 2)  URINE CULTURE  LACTIC ACID, PLASMA  PROCALCITONIN  CBC WITH DIFFERENTIAL/PLATELET  COMPREHENSIVE  METABOLIC PANEL  C-REACTIVE PROTEIN  D-DIMER, QUANTITATIVE (NOT AT [facility])  MAGNESIUM  FERRITIN    EKG EKG Interpretation  Date/Time:  Saturday December 25 2019 10:55:25 EST Ventricular Rate:  94 PR Interval:  210 QRS Duration: 118 QT Interval:  386 QTC Calculation: 482 R Axis:   -5 Text Interpretation: Atrial-sensed ventricular-paced rhythm with prolonged AV conduction Abnormal ECG Confirmed by Davonna Belling [phone]) on 12/25/2019 12:41:48 PM   Radiology DG Chest Port 1 View  Result Date: 12/25/2019 CLINICAL DATA:  74 year old female with shortness of breath EXAM: PORTABLE CHEST 1 VIEW COMPARISON:  06/20/2019 FINDINGS: Cardiomediastinal silhouette unchanged in size and contour. Pacing device on the left chest wall with 2 leads in place. No pneumothorax. Coarsened interstitial markings of the bilateral lungs similar to the comparison. Persistent opacity at the left lung base. Mild interlobular septal thickening. IMPRESSION: Chronic changes of the lungs with mild interlobular septal thickening, potentially representing early edema/CHF versus chronic changes in the setting of prior episodes of CHF. Left basilar opacity is likely scarring/atelectasis given the persistence. Unchanged cardiac pacing device. Electronically Signed   By: Corrie Mckusick D.O.   On: 12/25/2019 12:33    Procedures Procedures (including critical care time)  Medications Ordered in ED Medications  dexamethasone (DECADRON) tablet 6 mg (6 mg Oral Given 12/25/19 1344)  midodrine (PROAMATINE) tablet 10 mg (0 mg Oral Hold 12/25/19 1815)  simvastatin (ZOCOR) tablet 40 mg (has no administration in time range)  levothyroxine (SYNTHROID) tablet 75 mcg (has no administration in time range)  docusate sodium (COLACE) capsule 100 mg (has no administration in time range)  famotidine (PEPCID) tablet 20 mg (has no administration in time range)  ferric citrate (AURYXIA) tablet 210 mg (has no administration in time range)   polyethylene glycol (MIRALAX / GLYCOLAX) packet 17 g (has no administration in time range)  flavoxATE (URISPAS) tablet 100 mg (0 mg Oral Hold 12/25/19 1815)  gabapentin (NEURONTIN) capsule 100 mg (has no administration in time range)  heparin injection 5,000 Units (0 Units Subcutaneous Hold 12/25/19 1714)  guaiFENesin-dextromethorphan (ROBITUSSIN DM) 100-10 MG/5ML syrup 10 mL (has no administration in time range)  chlorpheniramine-HYDROcodone (TUSSIONEX) 10-8 MG/5ML suspension 5 mL (has no administration in time range)  ascorbic acid (VITAMIN C) tablet 500 mg (0 mg Oral Hold 12/25/19 1713)  zinc sulfate capsule 220 mg (0 mg Oral Hold 12/25/19 1714)  insulin aspart (novoLOG) injection 0-15 Units (0 Units Subcutaneous Not Given 12/25/19 1714)  ondansetron (ZOFRAN) tablet 4 mg (has no administration in time range)    Or  ondansetron (ZOFRAN) injection 4 mg (has no administration in time range)  fluconazole (DIFLUCAN) tablet 200 mg (has no administration in time range)  remdesivir 200 mg in sodium chloride 0.9% 250 mL IVPB (0 mg Intravenous Hold 12/25/19 1551)    Followed by  remdesivir 100 mg in sodium chloride  0.9 % 100 mL IVPB (has no administration in time range)  Chlorhexidine Gluconate Cloth 2 % PADS 6 each (has no administration in time range)  pentafluoroprop-tetrafluoroeth (GEBAUERS) aerosol 1 application (has no administration in time range)  lidocaine (PF) (XYLOCAINE) 1 % injection 5 mL (has no administration in time range)  lidocaine-prilocaine (EMLA) cream 1 application (has no administration in time range)  0.9 %  sodium chloride infusion (has no administration in time range)  0.9 %  sodium chloride infusion (has no administration in time range)  heparin injection 1,600 Units (has no administration in time range)  midodrine (PROAMATINE) tablet 10 mg (has no administration in time range)  ferric citrate (AURYXIA) tablet 420 mg (0 mg Oral Hold 12/25/19 1549)  cinacalcet (SENSIPAR) tablet  30 mg (has no administration in time range)  doxercalciferol (HECTOROL) injection 2 mcg (0 mcg Intravenous Hold 12/25/19 1548)  HYDROcodone-acetaminophen (NORCO/VICODIN) 5-325 MG per tablet 1 tablet (has no administration in time range)  sodium chloride flush (NS) 0.9 % injection 3 mL (3 mLs Intravenous Given 12/25/19 1339)    ED Course  I have reviewed the triage vital signs and the nursing notes.  Pertinent labs & imaging results that were available during my care of the patient were reviewed by me and considered in my medical decision making (see chart for details).    MDM Rules/Calculators/A&P                      Patient with Covid.  Generalized weakness.  Not hypoxic but generally weak and has been able to do dialysis.  Volume overload on x-ray.  Discussed with nephrology who will take patient to dialysis.  Admit to internal medicine. Final Clinical Impression(s) / ED Diagnoses Final diagnoses:  COVID-19  End stage renal disease on dialysis [facility])    Rx / DC Orders ED Discharge Orders    None       Davonna Belling, MD 12/25/19 2031

## 2019-12-25 NOTE — ED Notes (Signed)
Pt states that her sx have not changed since her covid + diagnosis yesterday, presents d/t weakness, unable to walk around her house, requires assistance to stand and pivot to bed, walk test not performed d/t pt reporting not able to walk around room. SpO2 95% after stand and pivot activity.

## 2019-12-26 LAB — URINE CULTURE

## 2019-12-26 LAB — COMPREHENSIVE METABOLIC PANEL
ALT: 15 U/L (ref 0–44)
AST: 21 U/L (ref 15–41)
Albumin: 2.9 g/dL — ABNORMAL LOW (ref 3.5–5.0)
Alkaline Phosphatase: 102 U/L (ref 38–126)
Anion gap: 17 — ABNORMAL HIGH (ref 5–15)
BUN: 32 mg/dL — ABNORMAL HIGH (ref 8–23)
CO2: 25 mmol/L (ref 22–32)
Calcium: 9 mg/dL (ref 8.9–10.3)
Chloride: 93 mmol/L — ABNORMAL LOW (ref 98–111)
Creatinine, Ser: 5.3 mg/dL — ABNORMAL HIGH (ref 0.44–1.00)
GFR calc Af Amer: 8 mL/min — ABNORMAL LOW (ref >60)
GFR calc non Af Amer: 7 mL/min — ABNORMAL LOW (ref >60)
Glucose, Bld: 143 mg/dL — ABNORMAL HIGH (ref 70–99)
Potassium: 4.4 mmol/L (ref 3.5–5.1)
Sodium: 135 mmol/L (ref 135–145)
Total Bilirubin: 0.4 mg/dL (ref 0.3–1.2)
Total Protein: 7 g/dL (ref 6.5–8.1)

## 2019-12-26 LAB — MAGNESIUM: Magnesium: 1.9 mg/dL (ref 1.7–2.4)

## 2019-12-26 LAB — CBC WITH DIFFERENTIAL/PLATELET
Abs Immature Granulocytes: 0.01 10*3/uL (ref 0.00–0.07)
Basophils Absolute: 0 10*3/uL (ref 0.0–0.1)
Basophils Relative: 0 %
Eosinophils Absolute: 0 10*3/uL (ref 0.0–0.5)
Eosinophils Relative: 0 %
HCT: 36.5 % (ref 36.0–46.0)
Hemoglobin: 11.6 g/dL — ABNORMAL LOW (ref 12.0–15.0)
Immature Granulocytes: 0 %
Lymphocytes Relative: 19 %
Lymphs Abs: 0.5 10*3/uL — ABNORMAL LOW (ref 0.7–4.0)
MCH: 28 pg (ref 26.0–34.0)
MCHC: 31.8 g/dL (ref 30.0–36.0)
MCV: 88 fL (ref 80.0–100.0)
Monocytes Absolute: 0.2 10*3/uL (ref 0.1–1.0)
Monocytes Relative: 8 %
Neutro Abs: 1.8 10*3/uL (ref 1.7–7.7)
Neutrophils Relative %: 73 %
Platelets: 162 10*3/uL (ref 150–400)
RBC: 4.15 MIL/uL (ref 3.87–5.11)
RDW: 15.2 % (ref 11.5–15.5)
WBC: 2.6 10*3/uL — ABNORMAL LOW (ref 4.0–10.5)
nRBC: 0 % (ref 0.0–0.2)

## 2019-12-26 LAB — GLUCOSE, CAPILLARY
Glucose-Capillary: 125 mg/dL — ABNORMAL HIGH (ref 70–99)
Glucose-Capillary: 141 mg/dL — ABNORMAL HIGH (ref 70–99)
Glucose-Capillary: 109 mg/dL — ABNORMAL HIGH (ref 70–99)
Glucose-Capillary: 234 mg/dL — ABNORMAL HIGH (ref 70–99)
Glucose-Capillary: 211 mg/dL — ABNORMAL HIGH (ref 70–99)

## 2019-12-26 LAB — FERRITIN: Ferritin: 1130 ng/mL — ABNORMAL HIGH (ref 11–307)

## 2019-12-26 LAB — D-DIMER, QUANTITATIVE: D-Dimer, Quant: 7.87 ug/mL-FEU — ABNORMAL HIGH (ref 0.00–0.50)

## 2019-12-26 LAB — C-REACTIVE PROTEIN: CRP: 19.3 mg/dL — ABNORMAL HIGH (ref <1.0)

## 2019-12-26 NOTE — Progress Notes (Addendum)
PROGRESS NOTE    Erika Fry  IRJ:[id number] DOB: Jul 29, 1946 DOA: 12/25/2019 PCP: [name], MD  Brief Narrative: 74 year old female with history of ESRD on hemodialysis TTS, type 2 diabetes, chronic diastolic CHF, Mobitz type II heart block status post PPM, anemia of chronic disease, interstitial cystitis, hypothyroidism, she was treated for sinusitis during the past 2 weeks with runny nose congestion and a cough. -Last couple of days became progressively weaker with loss of appetite, went to PCP on 1/23 and tested positive for COVID-19, 1/20 4 AM felt worse with increased fatigue, mild shortness of breath, loss of appetite and hence presented to the emergency room, COVID-19 point-of-care was positive, UA was abnormal, chest x-ray showed early edema/CHF versus chronic changes, complains of persistent cough with greenish sputum   Assessment & Plan:   Coronavirus 19 infection with questionable pneumonia -Chest x-ray mostly suggestive of edema and some chronic changes -Has mild hypoxia requiring 1 to 2 L of oxygen -Continue IV remdesivir and Decadron, day 2 today -Wean O2 as tolerated -Ambulate -Follow inflammatory labs -Procalcitonin is mildly elevated, follow-up blood cultures -Repeat x-ray tomorrow  ESRD on hemodialysis -Nephrology consulted, last HD 1/23 -Patient reports she has been losing body weight, I wonder if her dry weight is being lowered accordingly  Diet-controlled diabetes mellitus -Continue sliding scale insulin  Chronic diastolic CHF Second-degree AV block status post PPM -Stable, volume managed with HD  Chronic hypotension -Continue midodrine post HD  Chronic interstitial cystitis -Continue Urispas per home regimen and Percocet so she can tolerate dialysis  DVT prophylaxis:lovenox Code Status: Full Code Family Communication: no family at bedside Disposition Plan: home pending improvement in Covid pneumonia, completion of IV remdesivir, will be  challenging to set up at the outpatient remdesivir clinic given dialysis schedule  Consultants:  Nephrology  Procedures:   Antimicrobials:    Subjective: -Feels a little better today, appetite is better, denies any shortness of breath  Objective: Vitals:   12/25/19 1932 12/25/19 1942 12/25/19 2027 12/26/19 0810  BP:  116/61 (!) 109/59 (!) 102/56  Pulse:  86 78 62  Resp:  16 18 16   Temp:  98.1 F (36.7 C) 98.2 F (36.8 C) (!) 97.5 F (36.4 C)  TempSrc:  Oral Oral Axillary  SpO2: 100% 100% 93% 97%  Weight:      Height:        Intake/Output Summary (Last 24 hours) at 12/26/2019 1036 Last data filed at 12/26/2019 0900 Gross per 24 hour  Intake 0 ml  Output 1088 ml  Net -1088 ml   Filed Weights   12/25/19 1600 12/25/19 1622  Weight: 73 kg 81.6 kg    Examination:  General exam: Elderly pleasant female sitting up in bed, AAOx3, no distress Respiratory system: Poor air movement, few basilar rales Cardiovascular system: S1 & S2 heard, RRR Gastrointestinal system: Abdomen is nondistended, soft and nontender.Normal bowel sounds heard. Central nervous system: Alert and oriented. No focal neurological deficits. Extremities: No edema, Ace wrap on both legs Skin: No rashes Psychiatry: Judgement and insight appear normal. Mood & affect appropriate.     Data Reviewed:   CBC: Recent Labs  Lab 12/25/19 1101 12/25/19 1157 12/26/19 0412  WBC 3.5*  --  2.6*  NEUTROABS  --  2.9 1.8  HGB 13.5  --  11.6*  HCT 42.7  --  36.5  MCV 89.0  --  88.0  PLT PLATELET CLUMPS NOTED ON SMEAR, COUNT APPEARS DECREASED  --  301   Basic Metabolic Panel:  Recent Labs  Lab 12/25/19 1101 12/26/19 0412  NA 135 135  K 4.0 4.4  CL 89* 93*  CO2 28 25  GLUCOSE 125* 143*  BUN 39* 32*  CREATININE 7.24* 5.30*  CALCIUM 8.7* 9.0  MG  --  1.9   GFR: Estimated Creatinine Clearance: 9.7 mL/min (A) (by C-G formula based on SCr of 5.3 mg/dL (H)). Liver Function Tests: Recent Labs  Lab  12/25/19 1157 12/26/19 0412  AST 23 21  ALT 14 15  ALKPHOS 104 102  BILITOT 0.8 0.4  PROT 6.9 7.0  ALBUMIN 2.9* 2.9*   No results for input(s): LIPASE, AMYLASE in the last 168 hours. No results for input(s): AMMONIA in the last 168 hours. Coagulation Profile: No results for input(s): INR, PROTIME in the last 168 hours. Cardiac Enzymes: No results for input(s): CKTOTAL, CKMB, CKMBINDEX, TROPONINI in the last 168 hours. BNP (last 3 results) No results for input(s): PROBNP in the last 8760 hours. HbA1C: No results for input(s): HGBA1C in the last 72 hours. CBG: No results for input(s): GLUCAP in the last 168 hours. Lipid Profile: Recent Labs    12/25/19 1157  TRIG 194*   Thyroid Function Tests: No results for input(s): TSH, T4TOTAL, FREET4, T3FREE, THYROIDAB in the last 72 hours. Anemia Panel: Recent Labs    12/25/19 1157 12/26/19 0412  FERRITIN 1,192* 1,130*   Urine analysis:    Component Value Date/Time   COLORURINE YELLOW 12/25/2019 1131   APPEARANCEUR CLOUDY (A) 12/25/2019 1131   LABSPEC 1.020 12/25/2019 1131   PHURINE 8.5 (H) 12/25/2019 1131   GLUCOSEU NEGATIVE 12/25/2019 1131   HGBUR MODERATE (A) 12/25/2019 1131   BILIRUBINUR NEGATIVE 12/25/2019 [place] 12/25/2019 1131   PROTEINUR >300 (A) 12/25/2019 1131   NITRITE NEGATIVE 12/25/2019 1131   LEUKOCYTESUR MODERATE (A) 12/25/2019 1131   Sepsis Labs: @LABRCNTIP (procalcitonin:4,lacticidven:4)  )No results found for this or any previous visit (from the past 240 hour(s)).       Radiology Studies: DG Chest Port 1 View  Result Date: 12/25/2019 CLINICAL DATA:  74 year old female with shortness of breath EXAM: PORTABLE CHEST 1 VIEW COMPARISON:  06/20/2019 FINDINGS: Cardiomediastinal silhouette unchanged in size and contour. Pacing device on the left chest wall with 2 leads in place. No pneumothorax. Coarsened interstitial markings of the bilateral lungs similar to the comparison. Persistent  opacity at the left lung base. Mild interlobular septal thickening. IMPRESSION: Chronic changes of the lungs with mild interlobular septal thickening, potentially representing early edema/CHF versus chronic changes in the setting of prior episodes of CHF. Left basilar opacity is likely scarring/atelectasis given the persistence. Unchanged cardiac pacing device. Electronically Signed   By: Corrie Mckusick D.O.   On: 12/25/2019 12:33        Scheduled Meds: . vitamin C  500 mg Oral Daily  . Chlorhexidine Gluconate Cloth  6 each Topical Q0600  . [START ON 12/28/2019] cinacalcet  30 mg Oral Q T,Th,Sa-HD  . dexamethasone  6 mg Oral Daily  . doxercalciferol  2 mcg Intravenous Q T,Th,Sa-HD  . famotidine  20 mg Oral QHS  . ferric citrate  420 mg Oral TID WC  . flavoxATE  100 mg Oral TID  . [START ON 12/27/2019] fluconazole  200 mg Oral Once per day on Mon Wed Fri  . gabapentin  100 mg Oral QHS  . heparin  5,000 Units Subcutaneous Q8H  . insulin aspart  0-15 Units Subcutaneous Q4H  . levothyroxine  75 mcg Oral Q0600  . [  START ON 12/28/2019] midodrine  10 mg Oral Q T,Th,Sa-HD  . simvastatin  40 mg Oral QHS  . zinc sulfate  220 mg Oral Daily   Continuous Infusions: . sodium chloride    . sodium chloride    . remdesivir 200 mg in sodium chloride 0.9% 250 mL IVPB Stopped (12/25/19 1551)   Followed by  . remdesivir 100 mg in NS 100 mL       LOS: 1 day    Time spent: 55min  Domenic Polite, MD [facility]  12/26/2019, 10:36 AM

## 2019-12-26 NOTE — Progress Notes (Signed)
[facility] Progress Note   74 y.o. female with ESRD on hemodialysis T,Th, S at [facility]. PMH : DMT2, HFpEF, Interstitial cystitis, UTIs, hydronephrosis, AKI, hypothyroidism, hypomagnesemia, cellulitis BLE, PPM for Mobitz 2, AOCD, SHPT.  Tx for  sinusitis on Augmentin x2 weeks p/w weakness, anorexia.  COVID 19+ PMD with CXR  showing PNA.   Dialysis Orders: [place] T,Th,S 4 hrs 180NRe 400/800 80 kg 2.0 K/ 2.25 Ca R AVF -Heparin 1500 units IV TIW -Hectorol 2 mcg IV TIW -Senispar 30 mg PO TIW  Assessment/ Plan:   1.  COVID 19: Tested positive as OP 12/24/2019. Arrangements have been made at OP HD clinic for dialysis at [facility]. Has rec'd Decadron so far. Per primary 2.  Volume Overload-has not been eating well, losing weight. Missed HD 01/21 D/T being too weak to come to dialysis. Did have make up treatment 12/24/19, left 1.1 kg under OP EDW (EDW was raised earlier this week) Lower volume as tolerated. O2 sats 90-94 on RA 3. Interstitial Cystitis-controlled with Percocet 5/325 q4hrs; otherwise she cannot sit long enough for hemodialysis.  4.  ESRD -  T,Th, S via AVF.  Tolerated HD Sat with 1088 ml net UF  No indication for HD today; will plan next treatment for Tuesday.  5.  Hypotension/volume  - Chronic hypotension on Midodrine 10 mg PO prior to HD TIW. Chronic BLE edema-BLE ace wraps at present. UF as tolerated, never has high gains but difficulty reaching EDW D/T hypotension.  6.  Anemia  -HGB 13.5 No ESA/Fe at OP center. Follow HGB 7.  Metabolic bone disease - C Ca 9.5. Continue Auryxia binders, VDRA, Sensipar.  8.  Nutrition - Albumin low. Has not been eating well this week. NPO at present. Renal diet, renal vit, prostat.  8.    DM-per primary 9.    Hypothyroidism-per primary  Subjective:   Feeling better and 97% sat on only 1.5L . No f/c/n/v. Feels breathing is comfortable and wondering when she can go home.   Objective:   BP (!)  102/56 (BP Location: Left Arm)   Pulse 62   Temp (!) 97.5 F (36.4 C) (Axillary)   Resp 16   Ht 5\' 5"  (1.651 m)   Wt 81.6 kg Comment: bed weight  SpO2 97%   BMI 29.94 kg/m   Intake/Output Summary (Last 24 hours) at 12/26/2019 3662 Last data filed at 12/25/2019 1942 Gross per 24 hour  Intake --  Output 1088 ml  Net -1088 ml   Weight change:   Physical Exam: General: Pleasant elderly female in NAD Lungs: Decreased BS Heart: RRR  Abdomen: SNDNT +BS Dialysis Access: R AVF + bruit  Imaging: DG Chest Port 1 View  Result Date: 12/25/2019 CLINICAL DATA:  74 year old female with shortness of breath EXAM: PORTABLE CHEST 1 VIEW COMPARISON:  06/20/2019 FINDINGS: Cardiomediastinal silhouette unchanged in size and contour. Pacing device on the left chest wall with 2 leads in place. No pneumothorax. Coarsened interstitial markings of the bilateral lungs similar to the comparison. Persistent opacity at the left lung base. Mild interlobular septal thickening. IMPRESSION: Chronic changes of the lungs with mild interlobular septal thickening, potentially representing early edema/CHF versus chronic changes in the setting of prior episodes of CHF. Left basilar opacity is likely scarring/atelectasis given the persistence. Unchanged cardiac pacing device. Electronically Signed   By: Corrie Mckusick D.O.   On: 12/25/2019 12:33    Labs: BMET Recent Labs  Lab 12/25/19 1101 12/26/19 0412  NA  135 135  K 4.0 4.4  CL 89* 93*  CO2 28 25  GLUCOSE 125* 143*  BUN 39* 32*  CREATININE 7.24* 5.30*  CALCIUM 8.7* 9.0   CBC Recent Labs  Lab 12/25/19 1101 12/25/19 1157 12/26/19 0412  WBC 3.5*  --  2.6*  NEUTROABS  --  2.9 1.8  HGB 13.5  --  11.6*  HCT 42.7  --  36.5  MCV 89.0  --  88.0  PLT PLATELET CLUMPS NOTED ON SMEAR, COUNT APPEARS DECREASED  --  162    Medications:    . vitamin C  500 mg Oral Daily  . Chlorhexidine Gluconate Cloth  6 each Topical Q0600  . [START ON 12/28/2019] cinacalcet  30  mg Oral Q T,Th,Sa-HD  . dexamethasone  6 mg Oral Daily  . doxercalciferol  2 mcg Intravenous Q T,Th,Sa-HD  . famotidine  20 mg Oral QHS  . ferric citrate  420 mg Oral TID WC  . flavoxATE  100 mg Oral TID  . [START ON 12/27/2019] fluconazole  200 mg Oral Once per day on Mon Wed Fri  . gabapentin  100 mg Oral QHS  . heparin  5,000 Units Subcutaneous Q8H  . insulin aspart  0-15 Units Subcutaneous Q4H  . levothyroxine  75 mcg Oral Q0600  . [START ON 12/28/2019] midodrine  10 mg Oral Q T,Th,Sa-HD  . simvastatin  40 mg Oral QHS  . zinc sulfate  220 mg Oral Daily      Otelia Santee, MD 12/26/2019, 8:34 AM

## 2019-12-27 ENCOUNTER — Inpatient Hospital Stay (HOSPITAL_COMMUNITY): Payer: Medicare Other

## 2019-12-27 LAB — COMPREHENSIVE METABOLIC PANEL
ALT: 13 U/L (ref 0–44)
AST: 16 U/L (ref 15–41)
Albumin: 2.9 g/dL — ABNORMAL LOW (ref 3.5–5.0)
Alkaline Phosphatase: 90 U/L (ref 38–126)
Anion gap: 19 — ABNORMAL HIGH (ref 5–15)
BUN: 74 mg/dL — ABNORMAL HIGH (ref 8–23)
CO2: 23 mmol/L (ref 22–32)
Calcium: 9.2 mg/dL (ref 8.9–10.3)
Chloride: 92 mmol/L — ABNORMAL LOW (ref 98–111)
Creatinine, Ser: 7.65 mg/dL — ABNORMAL HIGH (ref 0.44–1.00)
GFR calc Af Amer: 5 mL/min — ABNORMAL LOW (ref >60)
GFR calc non Af Amer: 5 mL/min — ABNORMAL LOW (ref >60)
Glucose, Bld: 116 mg/dL — ABNORMAL HIGH (ref 70–99)
Potassium: 4.6 mmol/L (ref 3.5–5.1)
Sodium: 134 mmol/L — ABNORMAL LOW (ref 135–145)
Total Bilirubin: 0.7 mg/dL (ref 0.3–1.2)
Total Protein: 6.4 g/dL — ABNORMAL LOW (ref 6.5–8.1)

## 2019-12-27 LAB — CBC WITH DIFFERENTIAL/PLATELET
Abs Immature Granulocytes: 0.02 10*3/uL (ref 0.00–0.07)
Basophils Absolute: 0 10*3/uL (ref 0.0–0.1)
Basophils Relative: 0 %
Eosinophils Absolute: 0 10*3/uL (ref 0.0–0.5)
Eosinophils Relative: 0 %
HCT: 34.9 % — ABNORMAL LOW (ref 36.0–46.0)
Hemoglobin: 11.3 g/dL — ABNORMAL LOW (ref 12.0–15.0)
Immature Granulocytes: 0 %
Lymphocytes Relative: 13 %
Lymphs Abs: 0.7 10*3/uL (ref 0.7–4.0)
MCH: 28.5 pg (ref 26.0–34.0)
MCHC: 32.4 g/dL (ref 30.0–36.0)
MCV: 87.9 fL (ref 80.0–100.0)
Monocytes Absolute: 0.4 10*3/uL (ref 0.1–1.0)
Monocytes Relative: 8 %
Neutro Abs: 4.3 10*3/uL (ref 1.7–7.7)
Neutrophils Relative %: 79 %
Platelets: 194 10*3/uL (ref 150–400)
RBC: 3.97 MIL/uL (ref 3.87–5.11)
RDW: 15.1 % (ref 11.5–15.5)
WBC: 5.5 10*3/uL (ref 4.0–10.5)
nRBC: 0 % (ref 0.0–0.2)

## 2019-12-27 LAB — GLUCOSE, CAPILLARY
Glucose-Capillary: 179 mg/dL — ABNORMAL HIGH (ref 70–99)
Glucose-Capillary: 203 mg/dL — ABNORMAL HIGH (ref 70–99)
Glucose-Capillary: 103 mg/dL — ABNORMAL HIGH (ref 70–99)
Glucose-Capillary: 106 mg/dL — ABNORMAL HIGH (ref 70–99)
Glucose-Capillary: 252 mg/dL — ABNORMAL HIGH (ref 70–99)
Glucose-Capillary: 203 mg/dL — ABNORMAL HIGH (ref 70–99)
Glucose-Capillary: 181 mg/dL — ABNORMAL HIGH (ref 70–99)
Glucose-Capillary: 227 mg/dL — ABNORMAL HIGH (ref 70–99)

## 2019-12-27 LAB — C-REACTIVE PROTEIN: CRP: 9.7 mg/dL — ABNORMAL HIGH (ref <1.0)

## 2019-12-27 LAB — D-DIMER, QUANTITATIVE: D-Dimer, Quant: 4.76 ug/mL-FEU — ABNORMAL HIGH (ref 0.00–0.50)

## 2019-12-27 LAB — FERRITIN: Ferritin: 959 ng/mL — ABNORMAL HIGH (ref 11–307)

## 2019-12-27 LAB — PROCALCITONIN: Procalcitonin: 1.68 ng/mL

## 2019-12-27 MED ORDER — LORATADINE 10 MG PO TABS
10.0000 mg | ORAL_TABLET | Freq: Every day | ORAL | Status: DC
  Administered 2019-12-27 – 2019-12-29 (×3): 10 mg via ORAL
  Filled 2019-12-27 (×3): qty 1

## 2019-12-27 MED ORDER — SENNOSIDES-DOCUSATE SODIUM 8.6-50 MG PO TABS
1.0000 | ORAL_TABLET | Freq: Every day | ORAL | Status: DC
  Administered 2019-12-27 – 2019-12-28 (×2): 1 via ORAL
  Filled 2019-12-27 (×2): qty 1

## 2019-12-27 MED ORDER — CHLORHEXIDINE GLUCONATE CLOTH 2 % EX PADS
6.0000 | MEDICATED_PAD | Freq: Every day | CUTANEOUS | Status: DC
  Administered 2019-12-28: 6 via TOPICAL

## 2019-12-27 MED ORDER — ACETAMINOPHEN 325 MG PO TABS
650.0000 mg | ORAL_TABLET | Freq: Four times a day (QID) | ORAL | Status: DC | PRN
  Administered 2019-12-28 – 2019-12-29 (×2): 650 mg via ORAL
  Filled 2019-12-27 (×2): qty 2

## 2019-12-27 NOTE — Progress Notes (Signed)
Renal Navigator notes patient's COVID positive status. Patient's home OP HD clinic is [facility], but she has already been set up at [facility] isolation shift, TTS 12:00pm for treatment at discharge for 21 days post positive test.  Renal Navigator will follow for support and assistance as needed.  Alphonzo Cruise, [place] Renal Navigator [phone]

## 2019-12-27 NOTE — Progress Notes (Signed)
PROGRESS NOTE    Erika Fry  NUU:[id number] DOB: Jul 19, 1946 DOA: 12/25/2019 PCP: [name], MD  Brief Narrative: 74 year old female with history of ESRD on hemodialysis TTS, type 2 diabetes, chronic diastolic CHF, Mobitz type II heart block status post PPM, anemia of chronic disease, interstitial cystitis, hypothyroidism, she was treated for sinusitis during the past 2 weeks with runny nose congestion and a cough. -Last couple of days became progressively weaker with loss of appetite, went to PCP on 1/23 and tested positive for COVID-19, 1/24 AM felt worse with increased fatigue, mild shortness of breath, loss of appetite and hence presented to the emergency room, COVID-19 point-of-care was positive, UA was abnormal, chest x-ray showed early edema/CHF versus chronic changes, complains of persistent cough with greenish sputum   Assessment & Plan:   Coronavirus 19 infection with questionable pneumonia -Chest x-ray mostly suggestive of edema and some chronic changes -Has mild hypoxia required 1 to 2 L of oxygen initially, now weaned off -Continue IV remdesivir and Decadron, day 3 today -Inflammatory markers improving -Procalcitonin mildly elevated but improving, blood cultures are negative, urine culture with multiple species, monitor -Physical therapy, ambulate  ESRD on hemodialysis -Nephrology consulted, last HD 1/23 -Patient reports she has been losing body weight, will need dry weight lowered accordingly  Diet-controlled diabetes mellitus -CBG stable, continue sliding scale insulin  Chronic diastolic CHF Second-degree AV block status post PPM -Stable, volume managed with HD  Chronic hypotension -Continue midodrine post HD  Chronic interstitial cystitis -Continue Urispas per home regimen and Percocet so she can tolerate dialysis  DVT prophylaxis:lovenox Code Status: Full Code Family Communication: no family at bedside, called and d/w son Jenny Reichmann Disposition Plan:  home Wednesday pending improvement in Covid pneumonia, completion of IV remdesivir, will be challenging to set up at the outpatient remdesivir clinic given dialysis schedule  Consultants:  Nephrology  Procedures:   Antimicrobials:    Subjective: -Feeling better today, shortness of breath is improving, mild cough but overall better, appetite is improving as well  Objective: Vitals:   12/26/19 0810 12/26/19 1639 12/26/19 2330 12/27/19 0837  BP: (!) 102/56 114/68 125/79 112/61  Pulse: 62 78 74 64  Resp: 16 (!) 21  20  Temp: (!) 97.5 F (36.4 C) 97.8 F (36.6 C) (!) 97.4 F (36.3 C) 98.7 F (37.1 C)  TempSrc: Axillary  Oral Oral  SpO2: 97% 98% 98% 98%  Weight:      Height:        Intake/Output Summary (Last 24 hours) at 12/27/2019 1158 Last data filed at 12/27/2019 0900 Gross per 24 hour  Intake 660 ml  Output --  Net 660 ml   Filed Weights   12/25/19 1600 12/25/19 1622  Weight: 73 kg 81.6 kg    Examination:  Gen: Elderly pleasant female sitting up in bed, AAOx3, no distress HEENT: no JVD Lungs: Rare basilar rales, otherwise clear CVS: S1-S2, regular rate rhythm Abd: soft, Non tender, non distended, BS present Extremities: No edema, Ace wrap on both legs Skin: no new rashes Psychiatry: Judgement and insight appear normal. Mood & affect appropriate.     Data Reviewed:   CBC: Recent Labs  Lab 12/25/19 1101 12/25/19 1157 12/26/19 0412 12/27/19 0742  WBC 3.5*  --  2.6* 5.5  NEUTROABS  --  2.9 1.8 4.3  HGB 13.5  --  11.6* 11.3*  HCT 42.7  --  36.5 34.9*  MCV 89.0  --  88.0 87.9  PLT PLATELET CLUMPS NOTED ON SMEAR, COUNT  APPEARS DECREASED  --  162 409   Basic Metabolic Panel: Recent Labs  Lab 12/25/19 1101 12/26/19 0412 12/27/19 0742  NA 135 135 134*  K 4.0 4.4 4.6  CL 89* 93* 92*  CO2 28 25 23   GLUCOSE 125* 143* 116*  BUN 39* 32* 74*  CREATININE 7.24* 5.30* 7.65*  CALCIUM 8.7* 9.0 9.2  MG  --  1.9  --    GFR: Estimated Creatinine Clearance:  6.7 mL/min (A) (by C-G formula based on SCr of 7.65 mg/dL (H)). Liver Function Tests: Recent Labs  Lab 12/25/19 1157 12/26/19 0412 12/27/19 0742  AST 23 21 16   ALT 14 15 13   ALKPHOS 104 102 90  BILITOT 0.8 0.4 0.7  PROT 6.9 7.0 6.4*  ALBUMIN 2.9* 2.9* 2.9*   No results for input(s): LIPASE, AMYLASE in the last 168 hours. No results for input(s): AMMONIA in the last 168 hours. Coagulation Profile: No results for input(s): INR, PROTIME in the last 168 hours. Cardiac Enzymes: No results for input(s): CKTOTAL, CKMB, CKMBINDEX, TROPONINI in the last 168 hours. BNP (last 3 results) No results for input(s): PROBNP in the last 8760 hours. HbA1C: No results for input(s): HGBA1C in the last 72 hours. CBG: Recent Labs  Lab 12/26/19 1937 12/26/19 2329 12/27/19 0317 12/27/19 0830 12/27/19 1120  GLUCAP 179* 203* 103* 106* 252*   Lipid Profile: Recent Labs    12/25/19 1157  TRIG 194*   Thyroid Function Tests: No results for input(s): TSH, T4TOTAL, FREET4, T3FREE, THYROIDAB in the last 72 hours. Anemia Panel: Recent Labs    12/26/19 0412 12/27/19 0742  FERRITIN 1,130* 959*   Urine analysis:    Component Value Date/Time   COLORURINE YELLOW 12/25/2019 1131   APPEARANCEUR CLOUDY (A) 12/25/2019 1131   LABSPEC 1.020 12/25/2019 1131   PHURINE 8.5 (H) 12/25/2019 1131   GLUCOSEU NEGATIVE 12/25/2019 1131   HGBUR MODERATE (A) 12/25/2019 [place] 12/25/2019 [place] 12/25/2019 1131   PROTEINUR >300 (A) 12/25/2019 1131   NITRITE NEGATIVE 12/25/2019 1131   LEUKOCYTESUR MODERATE (A) 12/25/2019 1131   Sepsis Labs: @LABRCNTIP (procalcitonin:4,lacticidven:4)  ) Recent Results (from the past 240 hour(s))  Culture, Urine     Status: Abnormal   Collection Time: 12/25/19 11:31 AM   Specimen: Urine, Random  Result Value Ref Range Status   Specimen Description URINE, RANDOM  Final   Special Requests   Final    NONE Performed at [facility], [place] [address] [place], [place] [address]    Culture MULTIPLE SPECIES PRESENT, SUGGEST RECOLLECTION (A)  Final   Report Status 12/26/2019 FINAL  Final  Blood Culture (routine x 2)     Status: None (Preliminary result)   Collection Time: 12/25/19 12:30 PM   Specimen: BLOOD LEFT HAND  Result Value Ref Range Status   Specimen Description BLOOD LEFT HAND  Final   Special Requests   Final    BOTTLES DRAWN AEROBIC AND ANAEROBIC Blood Culture results may not be optimal due to an inadequate volume of blood received in culture bottles   Culture   Final    NO GROWTH 2 DAYS Performed at [facility], [place] [address] [place], [place] [address]    Report Status PENDING  Incomplete  Blood Culture (routine x 2)     Status: None (Preliminary result)   Collection Time: 12/25/19 12:31 PM   Specimen: BLOOD  Result Value Ref Range Status   Specimen Description BLOOD  RIGHT ANTECUBITAL  Final   Special Requests   Final    BOTTLES DRAWN AEROBIC ONLY Blood Culture results may not be optimal due to an inadequate volume of blood received in culture bottles   Culture   Final    NO GROWTH 2 DAYS Performed at [facility], [place] [address], [place], [place] [address]    Report Status PENDING  Incomplete         Radiology Studies: DG CHEST PORT 1 VIEW  Result Date: 12/27/2019 CLINICAL DATA:  Hypoxia. EXAM: PORTABLE CHEST 1 VIEW COMPARISON:  12/25/2019 and CT chest 02/13/2019. FINDINGS: Trachea is midline. Heart is enlarged, stable. Thoracic aorta is calcified. Pacemaker lead tips are stable in position. Mild interstitial prominence and indistinctness. Linear scarring in the lingula and left lower lobe. No definite pleural fluid. Old right rib fractures. IMPRESSION: 1. Suspect mild pulmonary edema. 2.  Aortic Atherosclerotics (ICD10-170.0). Electronically Signed   By: Lorin Picket M.D.   On: 12/27/2019 07:46   DG Chest Port 1 View  Result Date: 12/25/2019 CLINICAL DATA:  74 year old  female with shortness of breath EXAM: PORTABLE CHEST 1 VIEW COMPARISON:  06/20/2019 FINDINGS: Cardiomediastinal silhouette unchanged in size and contour. Pacing device on the left chest wall with 2 leads in place. No pneumothorax. Coarsened interstitial markings of the bilateral lungs similar to the comparison. Persistent opacity at the left lung base. Mild interlobular septal thickening. IMPRESSION: Chronic changes of the lungs with mild interlobular septal thickening, potentially representing early edema/CHF versus chronic changes in the setting of prior episodes of CHF. Left basilar opacity is likely scarring/atelectasis given the persistence. Unchanged cardiac pacing device. Electronically Signed   By: Corrie Mckusick D.O.   On: 12/25/2019 12:33        Scheduled Meds: . vitamin C  500 mg Oral Daily  . Chlorhexidine Gluconate Cloth  6 each Topical Q0600  . [START ON 12/28/2019] cinacalcet  30 mg Oral Q T,Th,Sa-HD  . dexamethasone  6 mg Oral Daily  . doxercalciferol  2 mcg Intravenous Q T,Th,Sa-HD  . famotidine  20 mg Oral QHS  . ferric citrate  420 mg Oral TID WC  . flavoxATE  100 mg Oral TID  . fluconazole  200 mg Oral Once per day on Mon Wed Fri  . gabapentin  100 mg Oral QHS  . heparin  5,000 Units Subcutaneous Q8H  . insulin aspart  0-15 Units Subcutaneous Q4H  . levothyroxine  75 mcg Oral Q0600  . [START ON 12/28/2019] midodrine  10 mg Oral Q T,Th,Sa-HD  . senna-docusate  1 tablet Oral QHS  . simvastatin  40 mg Oral QHS  . zinc sulfate  220 mg Oral Daily   Continuous Infusions: . sodium chloride    . sodium chloride    . remdesivir 200 mg in sodium chloride 0.9% 250 mL IVPB Stopped (12/25/19 1551)   Followed by  . remdesivir 100 mg in NS 100 mL Stopped (12/26/19 1900)     LOS: 2 days    Time spent: 7min  Domenic Polite, MD [facility]  12/27/2019, 11:58 AM

## 2019-12-27 NOTE — Progress Notes (Signed)
  [facility] Progress Note   74 y.o. female with ESRD on hemodialysis T,Th, S at [facility]. PMH : DMT2, HFpEF, Interstitial cystitis, UTIs, hydronephrosis, AKI, hypothyroidism, hypomagnesemia, cellulitis BLE, PPM for Mobitz 2, AOCD, SHPT.  Tx for  sinusitis on Augmentin x2 weeks p/w weakness, anorexia.  COVID 19+ PMD with CXR  showing PNA.   Dialysis: SW TTS   4h   400/800   80kg 2/2.25 bath R AVF  Hep 1500 -Hectorol 2 mcg IV TIW -Senispar 30 mg PO TIW  Assessment/ Plan:   1.  COVID 19: Tested positive as OP 12/24/2019. Arrangements have been made at OP HD clinic for dialysis at [facility]. Has rec'd Decadron so far. Per primary  2.  Volume Overload - has not been eating well, losing weight. Missed HD 01/21 D/T being too weak to come to dialysis. Did have make up treatment 12/24/19, left 1.1 kg under OP EDW (EDW was raised earlier this week) Lower volume as tolerated. O2 sats 90-94 on RA  3. Interstitial Cystitis-controlled with Percocet 5/325 q4hrs; otherwise she cannot sit long enough for hemodialysis  4. ESRD -  T,Th, S via AVF.  - tolerated Sat HD, next HD tomorrow  5.  Hypotension/volume  - Chronic hypotension on Midodrine 10 mg PO prior to HD TIW. Chronic BLE edema-BLE ace wraps at present. UF as tolerated, never has high gains but difficulty reaching EDW D/T hypotension.  6.  Anemia  -HGB 13.5 No ESA/Fe at OP center. Follow HGB 7.  Metabolic bone disease - C Ca 9.5. Continue Auryxia binders, VDRA, Sensipar.  8.  Nutrition - Albumin low. Has not been eating well this week. NPO at present. Renal diet, renal vit, prostat.  8.    DM-per primary 9.    Hypothyroidism-per primary  Kelly Splinter, MD 12/27/2019, 2:26 PM    Medications:    . vitamin C  500 mg Oral Daily  . Chlorhexidine Gluconate Cloth  6 each Topical Q0600  . [START ON 12/28/2019] cinacalcet  30 mg Oral Q T,Th,Sa-HD  . dexamethasone  6 mg Oral Daily  . doxercalciferol  2 mcg  Intravenous Q T,Th,Sa-HD  . famotidine  20 mg Oral QHS  . ferric citrate  420 mg Oral TID WC  . flavoxATE  100 mg Oral TID  . fluconazole  200 mg Oral Once per day on Mon Wed Fri  . gabapentin  100 mg Oral QHS  . heparin  5,000 Units Subcutaneous Q8H  . insulin aspart  0-15 Units Subcutaneous Q4H  . levothyroxine  75 mcg Oral Q0600  . [START ON 12/28/2019] midodrine  10 mg Oral Q T,Th,Sa-HD  . senna-docusate  1 tablet Oral QHS  . simvastatin  40 mg Oral QHS  . zinc sulfate  220 mg Oral Daily       Subjective:   No new c/o's, happy that she is on low O2.     Objective:   BP 112/61 (BP Location: Left Arm)   Pulse 64   Temp 98.7 F (37.1 C) (Oral)   Resp 20   Ht 5\' 5"  (1.651 m)   Wt 81.6 kg Comment: bed weight  SpO2 98%   BMI 29.94 kg/m   Physical Exam: General: Pleasant elderly female in NAD Lungs: Decreased BS Heart: RRR  Abdomen: SNDNT +BS Dialysis Access: R AVF + bruit

## 2019-12-27 NOTE — Progress Notes (Signed)
Renal Navigator attempted to contact patient in room to discuss OP HD treatment at discharge, but she did not answer. Renal Navigator will attempt again at a later time or try to reach a family member noted as an emergency contact.  Alphonzo Cruise, [place] Renal Navigator [phone]

## 2019-12-28 LAB — COMPREHENSIVE METABOLIC PANEL
ALT: 14 U/L (ref 0–44)
AST: 16 U/L (ref 15–41)
Albumin: 3.1 g/dL — ABNORMAL LOW (ref 3.5–5.0)
Alkaline Phosphatase: 103 U/L (ref 38–126)
Anion gap: 21 — ABNORMAL HIGH (ref 5–15)
BUN: 104 mg/dL — ABNORMAL HIGH (ref 8–23)
CO2: 23 mmol/L (ref 22–32)
Calcium: 9.2 mg/dL (ref 8.9–10.3)
Chloride: 88 mmol/L — ABNORMAL LOW (ref 98–111)
Creatinine, Ser: 9.15 mg/dL — ABNORMAL HIGH (ref 0.44–1.00)
GFR calc Af Amer: 4 mL/min — ABNORMAL LOW (ref >60)
GFR calc non Af Amer: 4 mL/min — ABNORMAL LOW (ref >60)
Glucose, Bld: 201 mg/dL — ABNORMAL HIGH (ref 70–99)
Potassium: 5 mmol/L (ref 3.5–5.1)
Sodium: 132 mmol/L — ABNORMAL LOW (ref 135–145)
Total Bilirubin: 0.7 mg/dL (ref 0.3–1.2)
Total Protein: 6.7 g/dL (ref 6.5–8.1)

## 2019-12-28 LAB — GLUCOSE, CAPILLARY
Glucose-Capillary: 204 mg/dL — ABNORMAL HIGH (ref 70–99)
Glucose-Capillary: 235 mg/dL — ABNORMAL HIGH (ref 70–99)
Glucose-Capillary: 150 mg/dL — ABNORMAL HIGH (ref 70–99)
Glucose-Capillary: 253 mg/dL — ABNORMAL HIGH (ref 70–99)
Glucose-Capillary: 258 mg/dL — ABNORMAL HIGH (ref 70–99)
Glucose-Capillary: 247 mg/dL — ABNORMAL HIGH (ref 70–99)

## 2019-12-28 LAB — CBC WITH DIFFERENTIAL/PLATELET
Abs Immature Granulocytes: 0.04 10*3/uL (ref 0.00–0.07)
Basophils Absolute: 0 10*3/uL (ref 0.0–0.1)
Basophils Relative: 0 %
Eosinophils Absolute: 0 10*3/uL (ref 0.0–0.5)
Eosinophils Relative: 0 %
HCT: 39.7 % (ref 36.0–46.0)
Hemoglobin: 12.8 g/dL (ref 12.0–15.0)
Immature Granulocytes: 1 %
Lymphocytes Relative: 12 %
Lymphs Abs: 0.8 10*3/uL (ref 0.7–4.0)
MCH: 28.1 pg (ref 26.0–34.0)
MCHC: 32.2 g/dL (ref 30.0–36.0)
MCV: 87.3 fL (ref 80.0–100.0)
Monocytes Absolute: 0.4 10*3/uL (ref 0.1–1.0)
Monocytes Relative: 6 %
Neutro Abs: 5.9 10*3/uL (ref 1.7–7.7)
Neutrophils Relative %: 81 %
Platelets: 205 10*3/uL (ref 150–400)
RBC: 4.55 MIL/uL (ref 3.87–5.11)
RDW: 15.2 % (ref 11.5–15.5)
WBC: 7.2 10*3/uL (ref 4.0–10.5)
nRBC: 0 % (ref 0.0–0.2)

## 2019-12-28 LAB — D-DIMER, QUANTITATIVE: D-Dimer, Quant: 3.6 ug/mL-FEU — ABNORMAL HIGH (ref 0.00–0.50)

## 2019-12-28 LAB — FERRITIN: Ferritin: 952 ng/mL — ABNORMAL HIGH (ref 11–307)

## 2019-12-28 LAB — PROCALCITONIN: Procalcitonin: 1.24 ng/mL

## 2019-12-28 LAB — C-REACTIVE PROTEIN: CRP: 8 mg/dL — ABNORMAL HIGH (ref <1.0)

## 2019-12-28 MED ORDER — INSULIN ASPART 100 UNIT/ML ~~LOC~~ SOLN
3.0000 [IU] | Freq: Three times a day (TID) | SUBCUTANEOUS | Status: DC
  Administered 2019-12-28 – 2019-12-29 (×4): 3 [IU] via SUBCUTANEOUS

## 2019-12-28 MED ORDER — HEPARIN SODIUM (PORCINE) 1000 UNIT/ML DIALYSIS: 1500.0000 [IU] | Freq: Once | INTRAMUSCULAR | Status: DC

## 2019-12-28 NOTE — Progress Notes (Signed)
  [facility] Progress Note   73 y.o. female with ESRD on hemodialysis T,Th, S at [facility]. PMH : DMT2, HFpEF, Interstitial cystitis, UTIs, hydronephrosis, AKI, hypothyroidism, hypomagnesemia, cellulitis BLE, PPM for Mobitz 2, AOCD, SHPT.  Tx for  sinusitis on Augmentin x2 weeks p/w weakness, anorexia.  COVID 19+ PMD with CXR  showing PNA.   Dialysis: SW TTS   4h   400/800   80kg 2/2.25 bath R AVF  Hep 1500 -Hectorol 2 mcg IV TIW -Senispar 30 mg PO TIW  Assessment/ Plan:   1.  COVID 19: Tested positive as OP 12/24/2019. Arrangements have been made at OP HD clinic for dialysis at [facility]. Has rec'd Decadron so far. Per primary  2.  Volume Overload - lower volume as tolerated. O2 sats 90-94 on RA  3. Interstitial Cystitis-controlled with Percocet 5/325 q4hrs; otherwise she cannot sit long enough for hemodialysis  4. ESRD -  T,Th, S via AVF.       - tolerated Sat HD, next HD today  5.  Hypotension/volume  - Chronic hypotension on Midodrine 10 mg PO prior to HD TIW. Chronic BLE edema-BLE ace wraps at present. UF as tolerated, never has high gains but difficulty reaching EDW D/T hypotension. 3kg up today 6.  Anemia  -HGB > 10, no esa needs here. Follow HGB 7.  Metabolic bone disease - C Ca 9.5. Continue Auryxia binders, VDRA, Sensipar.  8.  Nutrition - Albumin low. Has not been eating well. Renal diet, renal vit, prostat 8.    DM-per primary 9.    Hypothyroidism-per primary  Kelly Splinter, MD 12/28/2019, 10:49 AM  Subjective:   No new c/o's, happy that she is on low O2.     Objective:   BP (!) 103/53   Pulse 67   Temp 98.1 F (36.7 C) (Oral)   Resp 18   Ht 5\' 5"  (1.651 m)   Wt 83.1 kg   SpO2 98%   BMI 30.49 kg/m   Physical Exam: General: Pleasant elderly female in NAD Lungs: Decreased BS Heart: RRR  Abdomen: SNDNT +BS Dialysis Access: R AVF + bruit  Medications:    . vitamin C  500 mg Oral Daily  . Chlorhexidine Gluconate  Cloth  6 each Topical Q0600  . [START ON 12/28/2019] cinacalcet  30 mg Oral Q T,Th,Sa-HD  . dexamethasone  6 mg Oral Daily  . doxercalciferol  2 mcg Intravenous Q T,Th,Sa-HD  . famotidine  20 mg Oral QHS  . ferric citrate  420 mg Oral TID WC  . flavoxATE  100 mg Oral TID  . fluconazole  200 mg Oral Once per day on Mon Wed Fri  . gabapentin  100 mg Oral QHS  . heparin  5,000 Units Subcutaneous Q8H  . insulin aspart  0-15 Units Subcutaneous Q4H  . levothyroxine  75 mcg Oral Q0600  . [START ON 12/28/2019] midodrine  10 mg Oral Q T,Th,Sa-HD  . senna-docusate  1 tablet Oral QHS  . simvastatin  40 mg Oral QHS  . zinc sulfate  220 mg Oral Daily

## 2019-12-28 NOTE — Evaluation (Signed)
Physical Therapy Evaluation Patient Details Name: Erika Fry MRN: [id number] DOB: 1946-07-13 Today's Date: 12/28/2019   History of Present Illness  74 year old female with history of ESRD on hemodialysis TTS, type 2 diabetes, chronic diastolic CHF, Mobitz type II heart block status post PPM, anemia of chronic disease, interstitial cystitis, hypothyroidism, she was treated for sinusitis during the past 2 weeks with runny nose congestion and a cough.  Also with weakness and loss of appetite.   Found to be positive for Covid-19.  Clinical Impression  Patient presents with mobility close to functional baseline.  Currently S for ambulation due to generalized weakness.  SpO2 on RA stayed in 90's.  Patient at home alone, but receiving [facility] services PTA.  Feel she will benefit from skilled PT in the acute setting to allow d/c home alone with resuming HHPT services.  She would like to stay with same agency ([facility]) if they can accommodate her Covid diagnosis.     Follow Up Recommendations Home health PT;Supervision - Intermittent    Equipment Recommendations  None recommended by PT    Recommendations for Other Services       Precautions / Restrictions Precautions Precautions: Fall Restrictions Weight Bearing Restrictions: No      Mobility  Bed Mobility Overal bed mobility: Modified Independent                Transfers Overall transfer level: Modified independent Equipment used: Rolling walker (2 wheeled)             General transfer comment: no physical help for sit to stand  Ambulation/Gait Ambulation/Gait assistance: Supervision Gait Distance (Feet): 150 Feet Assistive device: Rolling walker (2 wheeled) Gait Pattern/deviations: Step-through pattern;Trunk flexed;Decreased stride length;Shuffle     General Gait Details: scuffs in her house slippers 5 x back and forth to the door with RW, able to manage turning the walker and mobilize with S for safety  Stairs             Wheelchair Mobility    Modified Rankin (Stroke Patients Only)       Balance Overall balance assessment: Needs assistance Sitting-balance support: Feet supported Sitting balance-Leahy Scale: Normal       Standing balance-Leahy Scale: Good Standing balance comment: able to stand without UE support and gesture with hands while talking, using RW for ambulation                             Pertinent Vitals/Pain Pain Assessment: No/denies pain    Home Living Family/patient expects to be discharged to:: Private residence Living Arrangements: Alone Available Help at Discharge: Family;Available 24 hours/day Type of Home: House Home Access: Stairs to enter   [employer] of Steps: 1 Home Layout: Laundry or work area in basement;Able to live on main level with bedroom/bathroom Home Equipment: Environmental consultant - 2 wheels;Cane - single point;Wheelchair - [employer];Shower seat;Grab bars - tub/shower      Prior Function Level of Independence: Independent with assistive device(s)         Comments: uses RW at baseline; had progressed to using cane with HHPT, but back to RW due to weakness     Hand Dominance   Dominant Hand: Right    Extremity/Trunk Assessment   Upper Extremity Assessment Upper Extremity Assessment: Defer to OT evaluation    Lower Extremity Assessment Lower Extremity Assessment: Generalized weakness    Cervical / Trunk Assessment Cervical / Trunk Assessment: Kyphotic  Communication  Communication: No difficulties  Cognition Arousal/Alertness: Awake/alert Behavior During Therapy: WFL for tasks assessed/performed Overall Cognitive Status: Within Functional Limits for tasks assessed                                        General Comments General comments (skin integrity, edema, etc.): Patient reports fall out of bed on Jan1 after not sleeping well new years eve night, still in hospital bed till  gets an adjustable bed for home.  Still with cut under L eye from her glasses    Exercises     Assessment/Plan    PT Assessment Patient needs continued PT services  PT Problem List Decreased strength;Decreased activity tolerance;Decreased balance       PT Treatment Interventions Therapeutic activities;Balance training;Therapeutic exercise;Functional mobility training;Gait training;Patient/family education;DME instruction    PT Goals (Current goals can be found in the Care Plan section)  Acute Rehab PT Goals Patient Stated Goal: to go home tomorrow PT Goal Formulation: With patient Time For Goal Achievement: 01/11/20 Potential to Achieve Goals: Good    Frequency Min 3X/week   Barriers to discharge        Co-evaluation               AM-PAC PT "6 Clicks" Mobility  Outcome Measure Help needed turning from your back to your side while in a flat bed without using bedrails?: A Little Help needed moving from lying on your back to sitting on the side of a flat bed without using bedrails?: A Little Help needed moving to and from a bed to a chair (including a wheelchair)?: None Help needed standing up from a chair using your arms (e.g., wheelchair or bedside chair)?: None Help needed to walk in hospital room?: A Little Help needed climbing 3-5 steps with a railing? : A Little 6 Click Score: 20    End of Session   Activity Tolerance: Patient tolerated treatment well Patient left: in bed;with call bell/phone within reach   PT Visit Diagnosis: Muscle weakness (generalized) (M62.81)    Time: 4008-6761 PT Time Calculation (min) (ACUTE ONLY): 33 min   Charges:   PT Evaluation $PT Eval Low Complexity: 1 Low PT Treatments $Gait Training: 8-22 mins        Magda Kiel, [place] [phone] 12/28/2019   Reginia Naas 12/28/2019, 4:17 PM

## 2019-12-28 NOTE — Progress Notes (Signed)
Renal Navigator notes plan for patient discharge tomorrow, 12/29/19. Navigator attempted to reach patient by phone in her room, but she did not answer again. Renal Navigator called 1st person listed on emergency contact list/Jennifer Adelsberger, who states she is patient's daughter-in-law. Anderson Malta has already received information for COVID isolation shift at [facility] and is prepared to take patient at discharge. Renal Navigator has prepared Anderson Malta for possible discharge tomorrow and start at [facility] on Thursday. Anderson Malta confirmed that patient has transportation. Anderson Malta reports that she will speak with patient about the plan. Renal Navigator will notify Emilie Rutter of patient's discharge and start date once it has been confirmed.  Alphonzo Cruise, [place] Renal Navigator [phone]

## 2019-12-28 NOTE — Plan of Care (Signed)
  Problem: Health Behavior/Discharge Planning: Goal: Ability to manage health-related needs will improve Outcome: Progressing   Problem: Activity: Goal: Risk for activity intolerance will decrease Outcome: Progressing   Problem: Nutrition: Goal: Adequate nutrition will be maintained Outcome: Progressing   Problem: Elimination: Goal: Will not experience complications related to bowel motility Outcome: Progressing Goal: Will not experience complications related to urinary retention Outcome: Progressing   Problem: Skin Integrity: Goal: Risk for impaired skin integrity will decrease Outcome: Progressing   Problem: Education: Goal: Knowledge of risk factors and measures for prevention of condition will improve Outcome: Progressing   Problem: Respiratory: Goal: Will maintain a patent airway Outcome: Progressing Goal: Complications related to the disease process, condition or treatment will be avoided or minimized Outcome: Progressing

## 2019-12-28 NOTE — Progress Notes (Addendum)
PROGRESS NOTE    Erika Fry  PTW:[id number] DOB: 06-Jun-1946 DOA: 12/25/2019 PCP: [name], MD  Brief Narrative: 74 year old female with history of ESRD on hemodialysis TTS, type 2 diabetes, chronic diastolic CHF, Mobitz type II heart block status post PPM, anemia of chronic disease, interstitial cystitis, hypothyroidism, she was treated for sinusitis during the past 2 weeks with runny nose congestion and a cough. -Last couple of days became progressively weaker with loss of appetite, went to PCP on 1/23 and tested positive for COVID-19, 1/24 AM felt worse with increased fatigue, mild shortness of breath, loss of appetite and hence presented to the emergency room, COVID-19 point-of-care was positive, UA was abnormal, chest x-ray showed early edema/CHF versus chronic changes, complains of persistent cough with greenish sputum -Improving with treatment for COVID-19   Assessment & Plan:   Coronavirus 19 infection with mild pneumonia -Chest x-ray mostly suggestive of edema and some chronic changes -Has mild hypoxia required 1 to 2 L of oxygen initially, now weaned off -Continue IV remdesivir and Decadron, day 4 today -Inflammatory markers improving -Procalcitonin mildly elevated but improving, blood cultures are negative, urine culture with multiple species, monitor -Physical therapy, ambulate -Discharge planning, home tomorrow  ESRD on hemodialysis -Nephrology consulted, last HD 1/23 -Patient reports she has been losing body weight, dry weight to be lowered per renal -Dialysis today  Diet-controlled diabetes mellitus -CBG up in the setting of Decadron, add meal coverage insulin  Chronic diastolic CHF Second-degree AV block status post PPM -Stable, volume managed with HD  Chronic hypotension -Continue midodrine post HD  Chronic interstitial cystitis -Continue Urispas per home regimen and Percocet so she can tolerate dialysis  DVT prophylaxis:lovenox Code Status:  Full Code Family Communication: no family at bedside, called and d/w son Jenny Reichmann 1/25 Disposition Plan: Home tomorrow  Consultants:  Nephrology  Procedures:   Antimicrobials:    Subjective: -Feels well, breathing is improving, cough is better, appetite is much better -Eating and drinking, ambulating in the room, about to go for dialysis today  Objective: Vitals:   12/28/19 0752 12/28/19 0835 12/28/19 0847 12/28/19 0930  BP: 112/63 123/70 (!) 109/46 (!) 103/53  Pulse:  66 (!) 57 67  Resp:  18    Temp: 98.1 F (36.7 C) 98.1 F (36.7 C)    TempSrc: Oral Oral    SpO2: 98%     Weight:  83.1 kg    Height:        Intake/Output Summary (Last 24 hours) at 12/28/2019 1054 Last data filed at 12/27/2019 1200 Gross per 24 hour  Intake 240 ml  Output -  Net 240 ml   Filed Weights   12/25/19 1600 12/25/19 1622 12/28/19 0835  Weight: 73 kg 81.6 kg 83.1 kg    Examination:  Gen: Elderly pleasant female sitting up in bed, AAOx3, no distress HEENT: PERRLA, Neck supple, no JVD Lungs: Diminished breath sounds the bases, otherwise clear  CVS: S1-S2, regular rate rhythm Abd: soft, Non tender, non distended, BS present Extremities: No edema, Ace wrap on both legs Skin: no new rashes Psychiatry: Judgement and insight appear normal. Mood & affect appropriate.     Data Reviewed:   CBC: Recent Labs  Lab 12/25/19 1101 12/25/19 1157 12/26/19 0412 12/27/19 0742 12/28/19 0401  WBC 3.5*  --  2.6* 5.5 7.2  NEUTROABS  --  2.9 1.8 4.3 5.9  HGB 13.5  --  11.6* 11.3* 12.8  HCT 42.7  --  36.5 34.9* 39.7  MCV 89.0  --  88.0 87.9 87.3  PLT PLATELET CLUMPS NOTED ON SMEAR, COUNT APPEARS DECREASED  --  162 194 726   Basic Metabolic Panel: Recent Labs  Lab 12/25/19 1101 12/26/19 0412 12/27/19 0742 12/28/19 0401  NA 135 135 134* 132*  K 4.0 4.4 4.6 5.0  CL 89* 93* 92* 88*  CO2 28 25 23 23   GLUCOSE 125* 143* 116* 201*  BUN 39* 32* 74* 104*  CREATININE 7.24* 5.30* 7.65* 9.15*   CALCIUM 8.7* 9.0 9.2 9.2  MG  --  1.9  --   --    GFR: Estimated Creatinine Clearance: 5.7 mL/min (A) (by C-G formula based on SCr of 9.15 mg/dL (H)). Liver Function Tests: Recent Labs  Lab 12/25/19 1157 12/26/19 0412 12/27/19 0742 12/28/19 0401  AST 23 21 16 16   ALT 14 15 13 14   ALKPHOS 104 102 90 103  BILITOT 0.8 0.4 0.7 0.7  PROT 6.9 7.0 6.4* 6.7  ALBUMIN 2.9* 2.9* 2.9* 3.1*   No results for input(s): LIPASE, AMYLASE in the last 168 hours. No results for input(s): AMMONIA in the last 168 hours. Coagulation Profile: No results for input(s): INR, PROTIME in the last 168 hours. Cardiac Enzymes: No results for input(s): CKTOTAL, CKMB, CKMBINDEX, TROPONINI in the last 168 hours. BNP (last 3 results) No results for input(s): PROBNP in the last 8760 hours. HbA1C: No results for input(s): HGBA1C in the last 72 hours. CBG: Recent Labs  Lab 12/27/19 1524 12/27/19 1903 12/27/19 2329 12/28/19 0316 12/28/19 0759  GLUCAP 203* 181* 227* 204* 235*   Lipid Profile: Recent Labs    12/25/19 1157  TRIG 194*   Thyroid Function Tests: No results for input(s): TSH, T4TOTAL, FREET4, T3FREE, THYROIDAB in the last 72 hours. Anemia Panel: Recent Labs    12/27/19 0742 12/28/19 0401  FERRITIN 959* 952*   Urine analysis:    Component Value Date/Time   COLORURINE YELLOW 12/25/2019 1131   APPEARANCEUR CLOUDY (A) 12/25/2019 1131   LABSPEC 1.020 12/25/2019 1131   PHURINE 8.5 (H) 12/25/2019 1131   GLUCOSEU NEGATIVE 12/25/2019 1131   HGBUR MODERATE (A) 12/25/2019 [place] 12/25/2019 [place] 12/25/2019 1131   PROTEINUR >300 (A) 12/25/2019 1131   NITRITE NEGATIVE 12/25/2019 1131   LEUKOCYTESUR MODERATE (A) 12/25/2019 1131   Sepsis Labs: @LABRCNTIP (procalcitonin:4,lacticidven:4)  ) Recent Results (from the past 240 hour(s))  Culture, Urine     Status: Abnormal   Collection Time: 12/25/19 11:31 AM   Specimen: Urine, Random  Result Value  Ref Range Status   Specimen Description URINE, RANDOM  Final   Special Requests   Final    NONE Performed at [facility], [place] [address] [place], [place] [address]    Culture MULTIPLE SPECIES PRESENT, SUGGEST RECOLLECTION (A)  Final   Report Status 12/26/2019 FINAL  Final  Blood Culture (routine x 2)     Status: None (Preliminary result)   Collection Time: 12/25/19 12:30 PM   Specimen: BLOOD LEFT HAND  Result Value Ref Range Status   Specimen Description BLOOD LEFT HAND  Final   Special Requests   Final    BOTTLES DRAWN AEROBIC AND ANAEROBIC Blood Culture results may not be optimal due to an inadequate volume of blood received in culture bottles   Culture   Final    NO GROWTH 3 DAYS Performed at [facility], [place] [address] [place],  [address]    Report Status PENDING  Incomplete  Blood Culture (  routine x 2)     Status: None (Preliminary result)   Collection Time: 12/25/19 12:31 PM   Specimen: BLOOD  Result Value Ref Range Status   Specimen Description BLOOD RIGHT ANTECUBITAL  Final   Special Requests   Final    BOTTLES DRAWN AEROBIC ONLY Blood Culture results may not be optimal due to an inadequate volume of blood received in culture bottles   Culture   Final    NO GROWTH 3 DAYS Performed at [facility], [place] [address], [place], [place] [address]    Report Status PENDING  Incomplete         Radiology Studies: DG CHEST PORT 1 VIEW  Result Date: 12/27/2019 CLINICAL DATA:  Hypoxia. EXAM: PORTABLE CHEST 1 VIEW COMPARISON:  12/25/2019 and CT chest 02/13/2019. FINDINGS: Trachea is midline. Heart is enlarged, stable. Thoracic aorta is calcified. Pacemaker lead tips are stable in position. Mild interstitial prominence and indistinctness. Linear scarring in the lingula and left lower lobe. No definite pleural fluid. Old right rib fractures. IMPRESSION: 1. Suspect mild pulmonary edema. 2.  Aortic Atherosclerotics (ICD10-170.0). Electronically Signed   By:  Lorin Picket M.D.   On: 12/27/2019 07:46        Scheduled Meds: . vitamin C  500 mg Oral Daily  . Chlorhexidine Gluconate Cloth  6 each Topical Q0600  . Chlorhexidine Gluconate Cloth  6 each Topical Q0600  . cinacalcet  30 mg Oral Q T,Th,Sa-HD  . dexamethasone  6 mg Oral Daily  . doxercalciferol  2 mcg Intravenous Q T,Th,Sa-HD  . famotidine  20 mg Oral QHS  . ferric citrate  420 mg Oral TID WC  . flavoxATE  100 mg Oral TID  . fluconazole  200 mg Oral Once per day on Mon Wed Fri  . gabapentin  100 mg Oral QHS  . [START ON 12/29/2019] heparin  1,500 Units Dialysis Once in dialysis  . heparin  5,000 Units Subcutaneous Q8H  . insulin aspart  0-15 Units Subcutaneous Q4H  . levothyroxine  75 mcg Oral Q0600  . loratadine  10 mg Oral Daily  . midodrine  10 mg Oral Q T,Th,Sa-HD  . senna-docusate  1 tablet Oral QHS  . simvastatin  40 mg Oral QHS  . zinc sulfate  220 mg Oral Daily   Continuous Infusions: . sodium chloride    . sodium chloride    . remdesivir 200 mg in sodium chloride 0.9% 250 mL IVPB Stopped (12/25/19 1551)   Followed by  . remdesivir 100 mg in NS 100 mL Stopped (12/27/19 1900)     LOS: 3 days   Time spent: 27min  Domenic Polite, MD [facility]  12/28/2019, 10:54 AM

## 2019-12-28 NOTE — Progress Notes (Signed)
OT Cancellation Note  Patient Details Name: KEONDRIA SIEVER MRN: [id number] DOB: [birth year]   Cancelled Treatment:    Reason Eval/Treat Not Completed: Patient at procedure or test/ unavailable. Pt at HD.  Will follow and see as able.   Jolaine Artist, OT Acute Rehabilitation Services Pager [phone] Office [phone]  Delight Stare 12/28/2019, 10:04 AM

## 2019-12-28 NOTE — TOC Initial Note (Addendum)
Transition of Care [facility]) - Initial/Assessment Note    Patient Details  Name: Erika Fry MRN: [id number] Date of Birth: 05/15/1946  [facility]) CM/SW Contact:    Maryclare Labrador, RN Phone Number: 12/28/2019, 12:52 PM  Clinical Narrative:  PTA independent from home alone.  Pt informed CM that she has a strong family network that makes sure that she is able to get to appt and HD sessions without concerns.  Pt confirms she is aware that she will need to go to [facility] for [facility] while she is positive.  Pt has the below equipment already in the home due to past illness events  Pt denied barriers with [facility] resources.   Pt requested that CM arrange follow up telehealth appt with [facility]- CM left VM with office requesting call back                 Expected Discharge Plan: Home/Self Care     Patient Goals and CMS Choice        Expected Discharge Plan and Services Expected Discharge Plan: Home/Self Care       Living arrangements for the past 2 months: Single Family Home                                      Prior Living Arrangements/Services Living arrangements for the past 2 months: Single Family Home Lives with:: Self Patient language and need for interpreter reviewed:: Yes Do you feel safe going back to the place where you live?: Yes      Need for Family Participation in Patient Care: Yes (Comment) Care giver support system in place?: Yes (comment) Current home services: DME(has wheelchair, hosptial bed, cane and walker from previous issues but PTA she only used walker when needed) Criminal Activity/Legal Involvement Pertinent to Current Situation/Hospitalization: No - Comment as needed  Activities of Daily Living Home Assistive Devices/Equipment: None ADL Screening (condition at time of admission) Patient's cognitive ability adequate to safely complete daily activities?: Yes Is the patient deaf or have difficulty hearing?: No Does the patient have  difficulty seeing, even when wearing glasses/contacts?: No Does the patient have difficulty concentrating, remembering, or making decisions?: No Patient able to express need for assistance with ADLs?: Yes Does the patient have difficulty dressing or bathing?: No Independently performs ADLs?: Yes (appropriate for developmental age) Does the patient have difficulty walking or climbing stairs?: No Weakness of Legs: None Weakness of Arms/Hands: None  Permission Sought/Granted                  Emotional Assessment   Attitude/Demeanor/Rapport: Gracious, Engaged, Charismatic Affect (typically observed): Accepting, Adaptable Orientation: : Oriented to Self, Oriented to Place, Oriented to  Time, Oriented to Situation   Psych Involvement: No (comment)  Admission diagnosis:  End stage renal disease on dialysis ([place]) [N18.6, Z99.2] Acute respiratory disease due to COVID-19 virus [U07.1, J06.9] COVID-19 [U07.1] Patient Active Problem List   Diagnosis Date Noted  . Acute respiratory disease due to COVID-19 virus 12/25/2019  . Encounter for care of pacemaker 09/07/2019  . ESRD (end stage renal disease) on dialysis ([place])   . Pain in both lower extremities   . Hypomagnesemia 06/11/2019  . DNR (do not resuscitate) discussion   . Cystitis   . Acute on chronic diastolic CHF (congestive heart failure) ([place]) 06/10/2019  . Chronic diastolic CHF (congestive heart failure) ([place]) 06/09/2019  .  Cellulitis of lower extremity 06/09/2019  . Bladder pain   . Hyponatremia   . Acute on chronic anemia   . Diabetes mellitus type 2 in obese ([place])   . Chronic kidney disease   . Drug induced constipation   . Neurogenic bladder   . Weakness generalized 04/21/2019  . Acquired hydronephrosis with ureteropelvic junction (UPJ) obstruction   . Pain in joint involving pelvic region and thigh   . Dysuria   . Palliative care by specialist   . AKI (acute kidney injury) ([place]) 04/10/2019  . Hydronephrosis  04/09/2019  . Interstitial cystitis 04/09/2019  . Bilateral leg edema 02/25/2019  . Chronic heart failure with preserved ejection fraction ([place]) 02/24/2019  . Pacemaker Medtronic Azure XT DR MRI Dual Chamber Pacemaker 03/25/2018 02/24/2019  . ARF (acute renal failure) ([place]) 02/10/2019  . Peripheral edema   . Fever 01/17/2019  . Acute lower UTI 01/17/2019  . Pressure injury of skin 01/17/2019  . Gross hematuria 01/14/2019  . Fluid retention   . Generalized edema   . Hypokalemia   . Acute right-sided low back pain with right-sided sciatica   . Acute blood loss anemia   . Anemia of chronic disease   . Thrombocytopenia ([place])   . Diabetes mellitus ([place])   . Morbid obesity ([place])   . Benign essential HTN   . Hypoalbuminemia due to protein-calorie malnutrition ([place])   . Occult blood in stools   . Gastric polyp   . Lumbar discitis 05/17/2018  . Blood loss anemia 05/12/2018  . Hyperlipidemia associated with type 2 diabetes mellitus ([place]) 05/10/2018  . Yeast UTI 04/30/2018  . Essential hypertension 04/29/2018  . Chronic kidney disease (CKD), stage IV (severe) ([place])   . Diet-controlled diabetes mellitus ([place])   . GERD (gastroesophageal reflux disease)   . Gout   . High cholesterol   . Hypothyroidism   . Iron deficiency anemia   . AV block, Mobitz 2 03/25/2018  . Mobitz type 2 second degree AV block 03/20/2018  . Exertional dyspnea 03/19/2018   PCP:  [name], MD Pharmacy:   [facility], [place] [address] Phone: [phone] Fax: [place], [place] [address] [place] [address] Phone: [phone] Fax: [phone]     Social Determinants of Health ([place]) Interventions    Readmission Risk Interventions Readmission Risk Prevention Plan 06/15/2019 04/13/2019  Transportation Screening Complete Complete   Medication Review (RN Care Manager) Complete Complete  PCP or Specialist appointment within 3-5 days of discharge Not Complete Complete  PCP/Specialist Appt Not Complete comments not ready for dc -  HRI or [place] Complete Complete  SW Recovery Care/Counseling Consult Complete Complete  Palliative Care Screening Not Applicable Not [place] Not Applicable Patient Refused  Some recent data might be hidden

## 2019-12-29 LAB — CBC WITH DIFFERENTIAL/PLATELET
Abs Immature Granulocytes: 0.08 10*3/uL — ABNORMAL HIGH (ref 0.00–0.07)
Basophils Absolute: 0 10*3/uL (ref 0.0–0.1)
Basophils Relative: 0 %
Eosinophils Absolute: 0 10*3/uL (ref 0.0–0.5)
Eosinophils Relative: 0 %
HCT: 35.7 % — ABNORMAL LOW (ref 36.0–46.0)
Hemoglobin: 11.7 g/dL — ABNORMAL LOW (ref 12.0–15.0)
Immature Granulocytes: 1 %
Lymphocytes Relative: 10 %
Lymphs Abs: 0.6 10*3/uL — ABNORMAL LOW (ref 0.7–4.0)
MCH: 28.5 pg (ref 26.0–34.0)
MCHC: 32.8 g/dL (ref 30.0–36.0)
MCV: 86.9 fL (ref 80.0–100.0)
Monocytes Absolute: 0.5 10*3/uL (ref 0.1–1.0)
Monocytes Relative: 8 %
Neutro Abs: 4.8 10*3/uL (ref 1.7–7.7)
Neutrophils Relative %: 81 %
Platelets: 169 10*3/uL (ref 150–400)
RBC: 4.11 MIL/uL (ref 3.87–5.11)
RDW: 15.1 % (ref 11.5–15.5)
WBC: 5.9 10*3/uL (ref 4.0–10.5)
nRBC: 0 % (ref 0.0–0.2)

## 2019-12-29 LAB — COMPREHENSIVE METABOLIC PANEL
ALT: 13 U/L (ref 0–44)
AST: 15 U/L (ref 15–41)
Albumin: 2.8 g/dL — ABNORMAL LOW (ref 3.5–5.0)
Alkaline Phosphatase: 95 U/L (ref 38–126)
Anion gap: 16 — ABNORMAL HIGH (ref 5–15)
BUN: 71 mg/dL — ABNORMAL HIGH (ref 8–23)
CO2: 26 mmol/L (ref 22–32)
Calcium: 9 mg/dL (ref 8.9–10.3)
Chloride: 92 mmol/L — ABNORMAL LOW (ref 98–111)
Creatinine, Ser: 6.81 mg/dL — ABNORMAL HIGH (ref 0.44–1.00)
GFR calc Af Amer: 6 mL/min — ABNORMAL LOW (ref >60)
GFR calc non Af Amer: 5 mL/min — ABNORMAL LOW (ref >60)
Glucose, Bld: 176 mg/dL — ABNORMAL HIGH (ref 70–99)
Potassium: 4.6 mmol/L (ref 3.5–5.1)
Sodium: 134 mmol/L — ABNORMAL LOW (ref 135–145)
Total Bilirubin: 0.6 mg/dL (ref 0.3–1.2)
Total Protein: 6 g/dL — ABNORMAL LOW (ref 6.5–8.1)

## 2019-12-29 LAB — GLUCOSE, CAPILLARY
Glucose-Capillary: 169 mg/dL — ABNORMAL HIGH (ref 70–99)
Glucose-Capillary: 120 mg/dL — ABNORMAL HIGH (ref 70–99)
Glucose-Capillary: 252 mg/dL — ABNORMAL HIGH (ref 70–99)

## 2019-12-29 LAB — FERRITIN: Ferritin: 749 ng/mL — ABNORMAL HIGH (ref 11–307)

## 2019-12-29 LAB — D-DIMER, QUANTITATIVE: D-Dimer, Quant: 3.35 ug/mL-FEU — ABNORMAL HIGH (ref 0.00–0.50)

## 2019-12-29 LAB — C-REACTIVE PROTEIN: CRP: 4.1 mg/dL — ABNORMAL HIGH (ref <1.0)

## 2019-12-29 MED ORDER — DEXAMETHASONE 4 MG PO TABS: 4.0000 mg | ORAL_TABLET | Freq: Every day | ORAL | 0 refills | Status: AC

## 2019-12-29 NOTE — Progress Notes (Signed)
Physical Therapy Treatment Patient Details Name: Erika Fry MRN: [id number] DOB: 1946/06/27 Today's Date: 12/29/2019    History of Present Illness 74 year old female with history of ESRD on hemodialysis TTS, type 2 diabetes, chronic diastolic CHF, Mobitz type II heart block status post PPM, anemia of chronic disease, interstitial cystitis, hypothyroidism, she was treated for sinusitis during the past 2 weeks with runny nose congestion and a cough.  Also with weakness and loss of appetite.   Found to be positive for Covid-19.    PT Comments    Patient progressing with mobility and able to demonstrate safe performance of HEP at counter with UE support.  Educated not to perform higher level balance activities on her own, to wait for HHPT.  Stable for home with HHPT.  Likely home today per pt.   Follow Up Recommendations  Home health PT;Supervision - Intermittent     Equipment Recommendations  None recommended by PT    Recommendations for Other Services       Precautions / Restrictions Precautions Precautions: Fall    Mobility  Bed Mobility Overal bed mobility: Modified Independent                Transfers   Equipment used: Rolling walker (2 wheeled)                Ambulation/Gait Ambulation/Gait assistance: Modified independent (Device/Increase time) Gait Distance (Feet): 150 Feet Assistive device: Rolling walker (2 wheeled) Gait Pattern/deviations: Step-through pattern;Decreased stride length     General Gait Details: good pace and stable with RW back and forth to door in the room   Stairs             Wheelchair Mobility    Modified Rankin (Stroke Patients Only)       Balance Overall balance assessment: Needs assistance Sitting-balance support: Feet supported Sitting balance-Leahy Scale: Normal       Standing balance-Leahy Scale: Fair Standing balance comment: demonstrated some standing balance activities performed with HHPT,  encouraged not to perform on her own right now to wait for assist from [place]: Awake/alert Behavior During Therapy: [facility] for tasks assessed/performed Overall Cognitive Status: Within Functional Limits for tasks assessed                                        Exercises General Exercises - Lower Extremity Hip ABduction/ADduction: Strengthening;10 reps;Standing Heel Raises: Strengthening;10 reps;Standing Mini-Sqauts: 10 reps;Standing Other Exercises Other Exercises: hip extension x 10 standing UE support at counter at sink    General Comments        Pertinent Vitals/Pain Pain Assessment: No/denies pain    Home Living                      Prior Function            PT Goals (current goals can now be found in the care plan section) Progress towards PT goals: Progressing toward goals    Frequency    Min 3X/week      PT Plan      Co-evaluation              AM-PAC PT "6 Clicks" Mobility   Outcome Measure  Help needed turning  from your back to your side while in a flat bed without using bedrails?: None Help needed moving from lying on your back to sitting on the side of a flat bed without using bedrails?: None Help needed moving to and from a bed to a chair (including a wheelchair)?: None Help needed standing up from a chair using your arms (e.g., wheelchair or bedside chair)?: None Help needed to walk in hospital room?: None Help needed climbing 3-5 steps with a railing? : A Little 6 Click Score: 23    End of Session   Activity Tolerance: Patient tolerated treatment well Patient left: in chair;with call bell/phone within reach   PT Visit Diagnosis: Muscle weakness (generalized) (M62.81)     Time: 1980-2217 PT Time Calculation (min) (ACUTE ONLY): 29 min  Charges:  $Gait Training: 8-22 mins $Therapeutic Exercise: 8-22 mins                     Magda Kiel,  [place] [phone] 12/29/2019    Reginia Naas 12/29/2019, 1:23 PM

## 2019-12-29 NOTE — Progress Notes (Signed)
Discharge paperwork reviewed with patient. All questions answered. Patient will receive last dose of Remdesiver and then will be discharged home with son this afternoon. Call bell in reach.

## 2019-12-29 NOTE — Discharge Summary (Signed)
Physician Discharge Summary  Erika Fry ULA:[id number] DOB: 23-Nov-1946 DOA: 12/25/2019  PCP: [name], MD  Admit date: 12/25/2019 Discharge date: 12/29/2019  Time spent: 35 minutes  Recommendations for Outpatient Follow-up:  1. PCP in 1 week 2. New dialysis center at [facility] until she completes Covid isolation   Discharge Diagnoses:  Principal Problem:   Acute respiratory disease due to COVID-19 virus Covid pneumonia Volume overload Interstitial cystitis   Diet-controlled diabetes mellitus (HCC)   GERD (gastroesophageal reflux disease)   Essential hypertension   Yeast UTI   Benign essential HTN   Chronic heart failure with preserved ejection fraction (HCC)   ESRD (end stage renal disease) on dialysis [facility])   Discharge Condition: Improved  Diet recommendation: Renal diabetic  Filed Weights   12/25/19 1622 12/28/19 0835 12/28/19 1404  Weight: 81.6 kg 83.1 kg 81.8 kg    History of present illness:  74 year old female with history of ESRD on hemodialysis TTS, type 2 diabetes, chronic diastolic CHF, Mobitz type II heart block status post PPM, anemia of chronic disease, interstitial cystitis, hypothyroidism, she was treated for sinusitis during the past 2 weeks with runny nose congestion and a cough. -Last couple of days became progressively weaker with loss of appetite, went to PCP on 1/23 and tested positive for COVID-19, 1/24 AM felt worse with increased fatigue, mild shortness of breath, loss of appetite and hence presented to the emergency room, COVID-19 point-of-care was positive, UA was abnormal, chest x-ray showed early edema/CHF versus chronic changes, complains of persistent cough with greenish sputum  Hospital Course:   Coronavirus 19 infection with mild pneumonia -Chest x-ray mostly suggestive of edema and some chronic changes -Initially had mild hypoxia required 1 to 2 L of oxygen, now weaned off -Completed 5-day course of remdesivir today, along  with 5 days of Decadron  -Clinically improving, inflammatory markers improved as well  -Ambulating independently without assistance  -She will be discharged home on Decadron today at a lower dose due to hyperglycemia and diabetes  ESRD on hemodialysis -Nephrology consulted, last HD 1/23 -Patient reports she has been losing body weight, dry weight to be lowered per renal -Underwent dialysis yesterday, continue to challenge dry weight at outpatient center -Patient instructed to go to new dialysis center while she has Covid  Diet-controlled diabetes mellitus -Sliding scale insulin used inpatient, at discharge lower Decadron dose -Carb modified diet  Chronic diastolic CHF Second-degree AV block status post PPM -Stable, volume managed with HD  Chronic hypotension -Continue midodrine post HD  Chronic interstitial cystitis -Continue Urispas per home regimen and Percocet so she can tolerate dialysis   Discharge Exam: Vitals:   12/28/19 2303 12/29/19 0819  BP: (!) 103/55 112/69  Pulse: 60 60  Resp:  18  Temp: 98.3 F (36.8 C) 97.7 F (36.5 C)  SpO2: 97% 100%    General: AAOx3 Cardiovascular: S1S2/RRR Respiratory: CTAB  Discharge Instructions   Discharge Instructions    Discharge instructions   Complete by: As directed    Renal Diabetic diet   Increase activity slowly   Complete by: As directed      Allergies as of 12/29/2019      Reactions   Baclofen Other (See Comments)   Somnolence- PUT THE PATIENT INTO A COMA AND "ALMOST KILLED HER"   Tape Other (See Comments)   SKIN WILL TEAR AND BRUISE EASILY!!   Other Other (See Comments)   Patient has a high tolerance to antibiotics- has taken a lot of them during  the course of her life   Codeine Other (See Comments)   Increased pain and couldn't sleep   Dextromethorphan-guaifenesin Other (See Comments)   Unknown reaction   Latex Rash   Certain briefs for incontinence break her out      Medication List    STOP  taking these medications   levofloxacin 250 MG tablet Commonly known as: LEVAQUIN     TAKE these medications   ascorbic acid 500 MG tablet Commonly known as: VITAMIN C Take 500 mg by mouth daily.   Auryxia 1 GM 210 MG(Fe) tablet Generic drug: ferric citrate Take 420 mg by mouth 3 (three) times daily with meals.   Biotin 10000 MCG Tabs Take 10,000 mcg by mouth daily with breakfast.   cetirizine 10 MG tablet Commonly known as: ZYRTEC Take 10 mg by mouth at bedtime.   dexamethasone 4 MG tablet Commonly known as: DECADRON Take 1 tablet (4 mg total) by mouth daily for 4 days.   DIALYVITE 800 WITH ZINC 0.8 MG Tabs Take 1 tablet by mouth daily.   flavoxATE 100 MG tablet Commonly known as: URISPAS Take 1 tablet (100 mg total) by mouth 3 (three) times daily as needed (bladder spasms, dysuria). What changed: when to take this   fluticasone 50 MCG/ACT nasal spray Commonly known as: FLONASE Place 2 sprays into both nostrils daily as needed for allergies or rhinitis.   gabapentin 100 MG capsule Commonly known as: NEURONTIN Take 1 capsule (100 mg total) by mouth 3 (three) times daily. What changed: when to take this   HYDROcodone-acetaminophen 5-325 MG tablet Commonly known as: NORCO/VICODIN Take 1 tablet by mouth 6 (six) times daily.   levothyroxine 75 MCG tablet Commonly known as: SYNTHROID Take 1 tablet (75 mcg total) by mouth daily before breakfast.   lidocaine-prilocaine cream Commonly known as: EMLA Apply 1 application topically See admin instructions. APPLY SMALL AMOUNT TO ACCESS SITE (AVF) 1 TO 2 HOURS BEFORE DIALYSIS. COVER WITH OCCLUSIVE DRESSING (SARAN WRAP)   Melatonin 3 MG Tabs Take 3 mg by mouth at bedtime.   midodrine 10 MG tablet Commonly known as: PROAMATINE Take 10 mg by mouth See admin instructions. Take 10 mg by mouth 30 minutes before dialysis only on Tues/Thurs/Sat   Pepcid AC Maximum Strength 20 MG tablet Generic drug: famotidine Take 20 mg by  mouth at bedtime.   polyethylene glycol 17 g packet Commonly known as: MIRALAX / GLYCOLAX Take 17 g by mouth 2 (two) times daily. What changed:   when to take this  reasons to take this   ProAir HFA 108 (90 Base) MCG/ACT inhaler Generic drug: albuterol Inhale 2 puffs into the lungs See admin instructions. Inhale 2 puffs into the lungs every four to six hours as needed for wheezing   senna 8.6 MG Tabs tablet Commonly known as: SENOKOT Take 1 tablet by mouth at bedtime.   simvastatin 40 MG tablet Commonly known as: ZOCOR Take 1 tablet (40 mg total) by mouth at bedtime.   Vitamin D3 50 MCG (2000 UT) Tabs Take 2,000 mg by mouth daily with breakfast.   zinc gluconate 50 MG tablet Take 50 mg by mouth daily.      Allergies  Allergen Reactions  . Baclofen Other (See Comments)    Somnolence- PUT THE PATIENT INTO A COMA AND "ALMOST KILLED HER"   . Tape Other (See Comments)    SKIN WILL TEAR AND BRUISE EASILY!!  . Other Other (See Comments)    Patient has a  high tolerance to antibiotics- has taken a lot of them during the course of her life  . Codeine Other (See Comments)    Increased pain and couldn't sleep  . Dextromethorphan-Guaifenesin Other (See Comments)    Unknown reaction  . Latex Rash    Certain briefs for incontinence break her out   Follow-up Information    Avva, Ravisankar, MD Follow up.   Specialty: Internal Medicine Why: Office will follow up with you directly regarding follow up televisit app Contact information: [place] [id number] [phone]            The results of significant diagnostics from this hospitalization (including imaging, microbiology, ancillary and laboratory) are listed below for reference.    Significant Diagnostic Studies: DG CHEST PORT 1 VIEW  Result Date: 12/27/2019 CLINICAL DATA:  Hypoxia. EXAM: PORTABLE CHEST 1 VIEW COMPARISON:  12/25/2019 and CT chest 02/13/2019. FINDINGS: Trachea is midline. Heart is  enlarged, stable. Thoracic aorta is calcified. Pacemaker lead tips are stable in position. Mild interstitial prominence and indistinctness. Linear scarring in the lingula and left lower lobe. No definite pleural fluid. Old right rib fractures. IMPRESSION: 1. Suspect mild pulmonary edema. 2.  Aortic Atherosclerotics (ICD10-170.0). Electronically Signed   By: Lorin Picket M.D.   On: 12/27/2019 07:46   DG Chest Port 1 View  Result Date: 12/25/2019 CLINICAL DATA:  74 year old female with shortness of breath EXAM: PORTABLE CHEST 1 VIEW COMPARISON:  06/20/2019 FINDINGS: Cardiomediastinal silhouette unchanged in size and contour. Pacing device on the left chest wall with 2 leads in place. No pneumothorax. Coarsened interstitial markings of the bilateral lungs similar to the comparison. Persistent opacity at the left lung base. Mild interlobular septal thickening. IMPRESSION: Chronic changes of the lungs with mild interlobular septal thickening, potentially representing early edema/CHF versus chronic changes in the setting of prior episodes of CHF. Left basilar opacity is likely scarring/atelectasis given the persistence. Unchanged cardiac pacing device. Electronically Signed   By: Corrie Mckusick D.O.   On: 12/25/2019 12:33    Microbiology: Recent Results (from the past 240 hour(s))  Culture, Urine     Status: Abnormal   Collection Time: 12/25/19 11:31 AM   Specimen: Urine, Random  Result Value Ref Range Status   Specimen Description URINE, RANDOM  Final   Special Requests   Final    NONE Performed at [facility], [address] [place], [place] [address]    Culture MULTIPLE SPECIES PRESENT, SUGGEST RECOLLECTION (A)  Final   Report Status 12/26/2019 FINAL  Final  Blood Culture (routine x 2)     Status: None (Preliminary result)   Collection Time: 12/25/19 12:30 PM   Specimen: BLOOD LEFT HAND  Result Value Ref Range Status   Specimen Description BLOOD LEFT HAND  Final   Special Requests    Final    BOTTLES DRAWN AEROBIC AND ANAEROBIC Blood Culture results may not be optimal due to an inadequate volume of blood received in culture bottles   Culture   Final    NO GROWTH 4 DAYS Performed at [facility], [place] [address], [place], [place] [address]    Report Status PENDING  Incomplete  Blood Culture (routine x 2)     Status: None (Preliminary result)   Collection Time: 12/25/19 12:31 PM   Specimen: BLOOD  Result Value Ref Range Status   Specimen Description BLOOD RIGHT ANTECUBITAL  Final   Special Requests   Final    BOTTLES DRAWN AEROBIC  ONLY Blood Culture results may not be optimal due to an inadequate volume of blood received in culture bottles   Culture   Final    NO GROWTH 4 DAYS Performed at [facility] [address] [place], [place] [address]    Report Status PENDING  Incomplete     Labs: Basic Metabolic Panel: Recent Labs  Lab 12/25/19 1101 12/26/19 0412 12/27/19 0742 12/28/19 0401 12/29/19 0332  NA 135 135 134* 132* 134*  K 4.0 4.4 4.6 5.0 4.6  CL 89* 93* 92* 88* 92*  CO2 28 25 23 23 26   GLUCOSE 125* 143* 116* 201* 176*  BUN 39* 32* 74* 104* 71*  CREATININE 7.24* 5.30* 7.65* 9.15* 6.81*  CALCIUM 8.7* 9.0 9.2 9.2 9.0  MG  --  1.9  --   --   --    Liver Function Tests: Recent Labs  Lab 12/25/19 1157 12/26/19 0412 12/27/19 0742 12/28/19 0401 12/29/19 0332  AST 23 21 16 16 15   ALT 14 15 13 14 13   ALKPHOS 104 102 90 103 95  BILITOT 0.8 0.4 0.7 0.7 0.6  PROT 6.9 7.0 6.4* 6.7 6.0*  ALBUMIN 2.9* 2.9* 2.9* 3.1* 2.8*   No results for input(s): LIPASE, AMYLASE in the last 168 hours. No results for input(s): AMMONIA in the last 168 hours. CBC: Recent Labs  Lab 12/25/19 1101 12/25/19 1157 12/26/19 0412 12/27/19 0742 12/28/19 0401 12/29/19 0332  WBC 3.5*  --  2.6* 5.5 7.2 5.9  NEUTROABS  --  2.9 1.8 4.3 5.9 4.8  HGB 13.5  --  11.6* 11.3* 12.8 11.7*  HCT 42.7  --  36.5 34.9* 39.7 35.7*  MCV 89.0  --  88.0 87.9 87.3 86.9  PLT  PLATELET CLUMPS NOTED ON SMEAR, COUNT APPEARS DECREASED  --  162 194 205 169   Cardiac Enzymes: No results for input(s): CKTOTAL, CKMB, CKMBINDEX, TROPONINI in the last 168 hours. BNP: BNP (last 3 results) Recent Labs    02/10/19 1715 06/09/19 1717  BNP 70.3 138.5*    ProBNP (last 3 results) No results for input(s): PROBNP in the last 8760 hours.  CBG: Recent Labs  Lab 12/28/19 1914 12/28/19 2303 12/29/19 0300 12/29/19 0816 12/29/19 1232  GLUCAP 258* 247* 169* 120* 252*       Signed:  Domenic Polite MD.  [facility] 12/29/2019, 1:10 PM

## 2019-12-29 NOTE — Progress Notes (Signed)
  [facility] Progress Note   74 y.o. female with ESRD on hemodialysis T,Th, S at [facility]. PMH : DMT2, HFpEF, Interstitial cystitis, UTIs, hydronephrosis, AKI, hypothyroidism, hypomagnesemia, cellulitis BLE, PPM for Mobitz 2, AOCD, SHPT.  Tx for  sinusitis on Augmentin x2 weeks p/w weakness, anorexia.  COVID 19+ PMD with CXR  showing PNA.   Dialysis: SW TTS   4h   400/800   80kg 2/2.25 bath R AVF  Hep 1500 -Hectorol 2 mcg IV TIW -Senispar 30 mg PO TIW  Assessment/ Plan:   1.  COVID 19: Tested positive as OP 12/24/2019. Arrangements have been made at OP HD clinic for dialysis at [facility]. Has rec'd Decadron so far. For DC home today.   2.  Volume- leaving at same dry wt around 80kg  3. Interstitial Cystitis-controlled with Percocet 5/325 q4hrs; otherwise she cannot sit long enough for hemodialysis  4. ESRD -  T,Th, S via AVF.       - next HD will be outpt tomorrow at [facility] covid shift  5.  Hypotension/volume  - Chronic hypotension on Midodrine 10 mg PO prior to HD TIW. Chronic BLE edema-BLE ace wraps at present. UF as tolerated 6.  Anemia  -HGB > 10, no esa needs here. Follow HGB 7.  Metabolic bone disease - C Ca 9.5. Continue Auryxia binders, VDRA, Sensipar.  8.  Nutrition - Albumin low. Has not been eating well. Renal diet, renal vit, prostat 8.    DM-per primary 9.    Hypothyroidism-per primary  Kelly Splinter, MD 12/29/2019, 2:03 PM  Subjective:   Seen in room, in good spirits, going home    Objective:   BP (!) 103/53   Pulse 67   Temp 98.1 F (36.7 C) (Oral)   Resp 18   Ht 5\' 5"  (1.651 m)   Wt 83.1 kg   SpO2 98%   BMI 30.49 kg/m   Physical Exam: General: Pleasant elderly female in NAD Lungs: Decreased BS Heart: RRR  Abdomen: SNDNT +BS Dialysis Access: R AVF + bruit  Medications:    . vitamin C  500 mg Oral Daily  . Chlorhexidine Gluconate Cloth  6 each Topical Q0600  . [START ON 12/28/2019] cinacalcet  30 mg Oral  Q T,Th,Sa-HD  . dexamethasone  6 mg Oral Daily  . doxercalciferol  2 mcg Intravenous Q T,Th,Sa-HD  . famotidine  20 mg Oral QHS  . ferric citrate  420 mg Oral TID WC  . flavoxATE  100 mg Oral TID  . fluconazole  200 mg Oral Once per day on Mon Wed Fri  . gabapentin  100 mg Oral QHS  . heparin  5,000 Units Subcutaneous Q8H  . insulin aspart  0-15 Units Subcutaneous Q4H  . levothyroxine  75 mcg Oral Q0600  . [START ON 12/28/2019] midodrine  10 mg Oral Q T,Th,Sa-HD  . senna-docusate  1 tablet Oral QHS  . simvastatin  40 mg Oral QHS  . zinc sulfate  220 mg Oral Daily

## 2019-12-29 NOTE — TOC Transition Note (Signed)
Transition of Care [facility]) - CM/SW Discharge Note   Patient Details  Name: Erika Fry MRN: [id number] Date of Birth: 1946/05/29  Transition of Care [facility]) CM/SW Contact:  Maryclare Labrador, RN Phone Number: 12/29/2019, 9:27 AM   Clinical Narrative:   Pt will discharge home today.  Pt is active with [facility] contacted and will resume services.  Discharge orders are signed - no outstanding TOC consults - CM signing off    Final next level of care: [place] Barriers to Discharge: Barriers Resolved   Patient Goals and CMS Choice Patient states their goals for this hospitalization and ongoing recovery are:: Pt states she is ready to get back home and get stronger CMS Medicare.gov Compare Post Acute Care list provided to:: Patient Choice offered to / list presented to : Patient  Discharge Placement                       Discharge Plan and Services                          HH Arranged: PT [facility] Agency: [facility] (now [facility]) Date [place]: 12/29/19 Time [place]: [phone] Representative spoke with at [place]: [place] ([place]) Interventions     Readmission Risk Interventions Readmission Risk Prevention Plan 06/15/2019 04/13/2019  Transportation Screening Complete Complete  Medication Review Press photographer) Complete Complete  PCP or Specialist appointment within 3-5 days of discharge Not Complete Complete  PCP/Specialist Appt Not Complete comments not ready for dc -  HRI or [place] Complete Complete  SW Recovery Care/Counseling Consult Complete Complete  Palliative Care Screening Not Applicable Not [place] Not Applicable Patient Refused  Some recent data might be hidden

## 2019-12-29 NOTE — Progress Notes (Signed)
IV removed, tele removed and returned. Patient assisted with dressing. Awaiting son to arrive to transport to front entrance. Call bell in reach.

## 2019-12-29 NOTE — Plan of Care (Signed)
  Problem: Education: Goal: Knowledge of General Education information will improve Description: Including pain rating scale, medication(s)/side effects and non-pharmacologic comfort measures Outcome: Adequate for Discharge   Problem: Health Behavior/Discharge Planning: Goal: Ability to manage health-related needs will improve Outcome: Adequate for Discharge   Problem: Clinical Measurements: Goal: Ability to maintain clinical measurements within normal limits will improve Outcome: Adequate for Discharge Goal: Will remain free from infection Outcome: Adequate for Discharge Goal: Diagnostic test results will improve Outcome: Adequate for Discharge Goal: Respiratory complications will improve Outcome: Adequate for Discharge Goal: Cardiovascular complication will be avoided Outcome: Adequate for Discharge   Problem: Activity: Goal: Risk for activity intolerance will decrease Outcome: Adequate for Discharge   Problem: Nutrition: Goal: Adequate nutrition will be maintained Outcome: Adequate for Discharge   Problem: Coping: Goal: Level of anxiety will decrease Outcome: Adequate for Discharge   Problem: Elimination: Goal: Will not experience complications related to bowel motility Outcome: Adequate for Discharge Goal: Will not experience complications related to urinary retention Outcome: Adequate for Discharge   Problem: Pain Managment: Goal: General experience of comfort will improve Outcome: Adequate for Discharge   Problem: Safety: Goal: Ability to remain free from injury will improve Outcome: Adequate for Discharge   Problem: Skin Integrity: Goal: Risk for impaired skin integrity will decrease Outcome: Adequate for Discharge   Problem: Education: Goal: Knowledge of risk factors and measures for prevention of condition will improve Outcome: Adequate for Discharge   Problem: Respiratory: Goal: Will maintain a patent airway Outcome: Adequate for Discharge Goal:  Complications related to the disease process, condition or treatment will be avoided or minimized Outcome: Adequate for Discharge

## 2019-12-30 ENCOUNTER — Telehealth: Payer: Self-pay | Admitting: Nephrology

## 2019-12-30 LAB — CULTURE, BLOOD (ROUTINE X 2)
Culture: NO GROWTH
Culture: NO GROWTH

## 2019-12-30 NOTE — Telephone Encounter (Signed)
Transition of Care Contact from [place]  Date of Discharge: 12/29/19 Date of Contact: 12/30/19 Method of contact: phone  Attempted to contact patient to discuss transition of care from inpatient admission. Patient did not answer the phone.  Message was left on patient's voicemail informing them we would attempt to call them again and if unable to reach will follow up at dialysis.  Jen Mow, PA-C [facility] Pager: [phone]

## 2020-02-02 ENCOUNTER — Other Ambulatory Visit: Payer: Self-pay

## 2020-02-02 ENCOUNTER — Emergency Department (HOSPITAL_COMMUNITY)
Admission: EM | Admit: 2020-02-02 | Discharge: 2020-02-03 | Disposition: A | Discharge status: Home / Self Care | Payer: Medicare Other | Attending: Emergency Medicine | Admitting: Emergency Medicine

## 2020-02-02 ENCOUNTER — Encounter (HOSPITAL_COMMUNITY): Payer: Self-pay | Admitting: Emergency Medicine

## 2020-02-02 DIAGNOSIS — I132 Hypertensive heart and chronic kidney disease with heart failure and with stage 5 chronic kidney disease, or end stage renal disease: Secondary | ICD-10-CM | POA: Insufficient documentation

## 2020-02-02 DIAGNOSIS — E039 Hypothyroidism, unspecified: Secondary | ICD-10-CM | POA: Insufficient documentation

## 2020-02-02 DIAGNOSIS — R062 Wheezing: Secondary | ICD-10-CM | POA: Insufficient documentation

## 2020-02-02 DIAGNOSIS — R42 Dizziness and giddiness: Secondary | ICD-10-CM | POA: Diagnosis not present

## 2020-02-02 DIAGNOSIS — I5032 Chronic diastolic (congestive) heart failure: Secondary | ICD-10-CM | POA: Diagnosis not present

## 2020-02-02 DIAGNOSIS — R079 Chest pain, unspecified: Secondary | ICD-10-CM | POA: Insufficient documentation

## 2020-02-02 DIAGNOSIS — N186 End stage renal disease: Secondary | ICD-10-CM | POA: Diagnosis not present

## 2020-02-02 DIAGNOSIS — Z79899 Other long term (current) drug therapy: Secondary | ICD-10-CM | POA: Insufficient documentation

## 2020-02-02 DIAGNOSIS — R0602 Shortness of breath: Secondary | ICD-10-CM | POA: Diagnosis present

## 2020-02-02 DIAGNOSIS — Z992 Dependence on renal dialysis: Secondary | ICD-10-CM | POA: Insufficient documentation

## 2020-02-02 DIAGNOSIS — E1122 Type 2 diabetes mellitus with diabetic chronic kidney disease: Secondary | ICD-10-CM | POA: Diagnosis not present

## 2020-02-02 DIAGNOSIS — R0600 Dyspnea, unspecified: Secondary | ICD-10-CM

## 2020-02-02 LAB — CBC
HCT: 32.1 % — ABNORMAL LOW (ref 36.0–46.0)
Hemoglobin: 10 g/dL — ABNORMAL LOW (ref 12.0–15.0)
MCH: 29.2 pg (ref 26.0–34.0)
MCHC: 31.2 g/dL (ref 30.0–36.0)
MCV: 93.9 fL (ref 80.0–100.0)
Platelets: 167 10*3/uL (ref 150–400)
RBC: 3.42 MIL/uL — ABNORMAL LOW (ref 3.87–5.11)
RDW: 18.1 % — ABNORMAL HIGH (ref 11.5–15.5)
WBC: 7.6 10*3/uL (ref 4.0–10.5)
nRBC: 0 % (ref 0.0–0.2)

## 2020-02-02 LAB — BASIC METABOLIC PANEL
Anion gap: 17 — ABNORMAL HIGH (ref 5–15)
BUN: 32 mg/dL — ABNORMAL HIGH (ref 8–23)
CO2: 28 mmol/L (ref 22–32)
Calcium: 10.1 mg/dL (ref 8.9–10.3)
Chloride: 90 mmol/L — ABNORMAL LOW (ref 98–111)
Creatinine, Ser: 6.7 mg/dL — ABNORMAL HIGH (ref 0.44–1.00)
GFR calc Af Amer: 6 mL/min — ABNORMAL LOW (ref >60)
GFR calc non Af Amer: 6 mL/min — ABNORMAL LOW (ref >60)
Glucose, Bld: 137 mg/dL — ABNORMAL HIGH (ref 70–99)
Potassium: 4.7 mmol/L (ref 3.5–5.1)
Sodium: 135 mmol/L (ref 135–145)

## 2020-02-02 LAB — TROPONIN I (HIGH SENSITIVITY): Troponin I (High Sensitivity): 10 ng/L (ref <18)

## 2020-02-02 NOTE — ED Triage Notes (Addendum)
Pt arrives to ED from home with complaints of shortness of breath and chest pain starting three days ago that has worsened since. Patient states that the shortness of breath has gotten to the point where she is having trouble getting around. Patient was admitted for covid19 pneumonia in Janurary. Patient is also a dialysis patient with last treatment yesterday. Patient states she has swabbed negative this week at her dialysis center.

## 2020-02-03 ENCOUNTER — Emergency Department (HOSPITAL_COMMUNITY): Payer: Medicare Other

## 2020-02-03 DIAGNOSIS — R42 Dizziness and giddiness: Secondary | ICD-10-CM | POA: Diagnosis not present

## 2020-02-03 MED ORDER — LORAZEPAM 1 MG PO TABS: 0.5000 mg | ORAL_TABLET | Freq: Three times a day (TID) | ORAL | 0 refills | Status: DC | PRN

## 2020-02-03 NOTE — ED Provider Notes (Signed)
[facility] EMERGENCY DEPARTMENT Provider Note   CSN: [id number] Arrival date & time: 02/02/20  1737     History Chief Complaint  Patient presents with  . Shortness of Breath  . Chest Pain    Erika Fry is a 74 y.o. female.   Shortness of Breath Severity:  Mild Onset quality:  Gradual Timing:  Intermittent Progression:  Waxing and waning Chronicity:  Recurrent Context: activity   Relieved by:  Rest Worsened by:  Nothing Ineffective treatments:  None tried Associated symptoms: chest pain and ear pain   Associated symptoms: no vomiting   Chest Pain Associated symptoms: fatigue, nausea and shortness of breath   Associated symptoms: no back pain and no vomiting        Past Medical History:  Diagnosis Date  . Arthritis    "knees, thumbs" (03/25/2018)  . AV block, Mobitz 2 03/25/2018  . CKD (chronic kidney disease), stage IV ([place])   . Complication of anesthesia    Reports had hard time waking up in the past  . Diet-controlled diabetes mellitus ([place])   . Encounter for care of pacemaker 09/07/2019  . End stage renal failure on dialysis ([place])    T, Th, Sat [employer]  . GERD (gastroesophageal reflux disease)   . Gout    "on daily RX" (03/25/2018)  . Heart murmur   . High cholesterol   . Hypertension   . Hypothyroidism   . Iron deficiency anemia    "had to get an iron infusion"  . Migraine    "used to have them growing up" (03/25/2018)  . Presence of permanent cardiac pacemaker 03/25/2018    Patient Active Problem List   Diagnosis Date Noted  . Acute respiratory disease due to COVID-19 virus 12/25/2019  . Encounter for care of pacemaker 09/07/2019  . ESRD (end stage renal disease) on dialysis ([place])   . Pain in both lower extremities   . Hypomagnesemia 06/11/2019  . DNR (do not resuscitate) discussion   . Cystitis   . Acute on chronic diastolic CHF (congestive heart failure) ([place]) 06/10/2019  . Chronic diastolic CHF (congestive heart  failure) ([place]) 06/09/2019  . Cellulitis of lower extremity 06/09/2019  . Bladder pain   . Hyponatremia   . Acute on chronic anemia   . Diabetes mellitus type 2 in obese ([place])   . Chronic kidney disease   . Drug induced constipation   . Neurogenic bladder   . Weakness generalized 04/21/2019  . Acquired hydronephrosis with ureteropelvic junction (UPJ) obstruction   . Pain in joint involving pelvic region and thigh   . Dysuria   . Palliative care by specialist   . AKI (acute kidney injury) ([place]) 04/10/2019  . Hydronephrosis 04/09/2019  . Interstitial cystitis 04/09/2019  . Bilateral leg edema 02/25/2019  . Chronic heart failure with preserved ejection fraction ([place]) 02/24/2019  . Pacemaker Medtronic Azure XT DR MRI Dual Chamber Pacemaker 03/25/2018 02/24/2019  . ARF (acute renal failure) ([place]) 02/10/2019  . Peripheral edema   . Fever 01/17/2019  . Acute lower UTI 01/17/2019  . Pressure injury of skin 01/17/2019  . Gross hematuria 01/14/2019  . Fluid retention   . Generalized edema   . Hypokalemia   . Acute right-sided low back pain with right-sided sciatica   . Acute blood loss anemia   . Anemia of chronic disease   . Thrombocytopenia ([place])   . Diabetes mellitus ([place])   . Morbid obesity ([place])   . Benign essential  HTN   . Hypoalbuminemia due to protein-calorie malnutrition ([place])   . Occult blood in stools   . Gastric polyp   . Lumbar discitis 05/17/2018  . Blood loss anemia 05/12/2018  . Hyperlipidemia associated with type 2 diabetes mellitus ([place]) 05/10/2018  . Yeast UTI 04/30/2018  . Essential hypertension 04/29/2018  . Chronic kidney disease (CKD), stage IV (severe) ([place])   . Diet-controlled diabetes mellitus ([place])   . GERD (gastroesophageal reflux disease)   . Gout   . High cholesterol   . Hypothyroidism   . Iron deficiency anemia   . AV block, Mobitz 2 03/25/2018  . Mobitz type 2 second degree AV block 03/20/2018  . Exertional dyspnea 03/19/2018    Past  Surgical History:  Procedure Laterality Date  . APPENDECTOMY    . AV FISTULA PLACEMENT Right 06/29/2019   Procedure: ARTERIOVENOUS (AV) FISTULA CREATION RIGHT UPPER  ARM;  Surgeon: Waynetta Sandy, MD;  Location: [facility];  Service: Vascular;  Laterality: Right;  . BASCILIC VEIN TRANSPOSITION Right 09/08/2019   Procedure: BASILIC VEIN TRANSPOSITION RIGHT SECOND STAGE;  Surgeon: Waynetta Sandy, MD;  Location: [place];  Service: Vascular;  Laterality: Right;  . BIOPSY  05/19/2018   Procedure: BIOPSY;  Surgeon: Ladene Artist, MD;  Location: [place];  Service: Endoscopy;;  . Consuela Mimes W/ RETROGRADES Bilateral 01/14/2019   Procedure: CYSTOSCOPY WITH RETROGRADE PYELOGRAM BILATERAL HYDRODISTENTION;  Surgeon: Ardis Hughs, MD;  Location: [facility];  Service: Urology;  Laterality: Bilateral;  . ESOPHAGOGASTRODUODENOSCOPY N/A 05/19/2018   Procedure: ESOPHAGOGASTRODUODENOSCOPY (EGD);  Surgeon: Ladene Artist, MD;  Location[facility] ENDOSCOPY;  Service: Endoscopy;  Laterality: N/A;  . ESOPHAGOGASTRODUODENOSCOPY (EGD) WITH PROPOFOL N/A 01/28/2019   Procedure: ESOPHAGOGASTRODUODENOSCOPY (EGD) WITH PROPOFOL;  Surgeon: Rush Landmark Telford Nab., MD;  Location: [place];  Service: Gastroenterology;  Laterality: N/A;  . INSERT / REPLACE / REMOVE PACEMAKER  03/25/2018  . IR FLUORO GUIDE CV LINE RIGHT  05/18/2018  . IR FLUORO GUIDE CV LINE RIGHT  06/24/2019  . IR LUMBAR DISC ASPIRATION W/IMG GUIDE  05/15/2018  . IR NEPHROSTOMY EXCHANGE RIGHT  05/28/2019  . IR NEPHROSTOMY EXCHANGE RIGHT  07/19/2019  . IR NEPHROSTOMY PLACEMENT RIGHT  04/09/2019  . IR REMOVAL TUN CV CATH W/O FL  07/23/2018  . IR US GUIDE VASC ACCESS RIGHT  05/18/2018  . IR US GUIDE VASC ACCESS RIGHT  06/24/2019  . KNEE ARTHROSCOPY Bilateral   . PACEMAKER IMPLANT N/A 03/25/2018   Procedure: PACEMAKER IMPLANT;  Surgeon: Evans Lance, MD;  Location: [place] CV LAB;  Service: Cardiovascular;  Laterality: N/A;  . PACEMAKER  PLACEMENT Left 03/2018  . POLYPECTOMY  01/28/2019   Procedure: POLYPECTOMY;  Surgeon: Mansouraty, Telford Nab., MD;  Location: [place];  Service: Gastroenterology;;  . RIGHT HEART CATH N/A 03/20/2018   Procedure: RIGHT HEART CATH;  Surgeon: Nigel Mormon, MD;  Location: [place] CV LAB;  Service: Cardiovascular;  Laterality: N/A;  . TEE WITHOUT CARDIOVERSION N/A 03/20/2018   Procedure: TRANSESOPHAGEAL ECHOCARDIOGRAM (TEE);  Surgeon: Nigel Mormon, MD;  Location: [facility] ENDOSCOPY;  Service: Cardiovascular;  Laterality: N/A;  . TONSILLECTOMY    . URETEROSCOPY WITH HOLMIUM LASER LITHOTRIPSY Right 01/14/2019   Procedure: URETEROSCOPY WITH HOLMIUM LASER LITHOTRIPSY BLADDER BIOPSIES RIGHT STENT PLACEMENT;  Surgeon: Ardis Hughs, MD;  Location: [facility];  Service: Urology;  Laterality: Right;     OB History   No obstetric history on file.     Family History  Problem Relation Age of  Onset  . Hypertension Mother   . Diabetes Mellitus II Father   . Heart disease Father   . Heart attack Father   . Gastric cancer Brother   . Diabetes Mellitus II Brother   . Stomach cancer Brother     Social History   Tobacco Use  . Smoking status: Never Smoker  . Smokeless tobacco: Never Used  Substance Use Topics  . Alcohol use: Not Currently  . Drug use: Never    Home Medications Prior to Admission medications   Medication Sig Start Date End Date Taking? Authorizing Provider  albuterol (PROAIR HFA) 108 (90 Base) MCG/ACT inhaler Inhale 2 puffs into the lungs See admin instructions. Inhale 2 puffs into the lungs every four to six hours as needed for wheezing    [provider]  ascorbic acid (VITAMIN C) 500 MG tablet Take 500 mg by mouth daily.    [provider]  B Complex-C-Zn-Folic Acid (DIALYVITE 557 WITH ZINC) 0.8 MG TABS Take 1 tablet by mouth daily.  07/01/19   [provider]  Biotin 10000 MCG TABS Take 10,000 mcg by mouth daily with breakfast.     [provider]  cetirizine (ZYRTEC) 10 MG tablet Take 10 mg by mouth at bedtime.     [provider]  Cholecalciferol (VITAMIN D3) 50 MCG (2000 UT) TABS Take 2,000 mg by mouth daily with breakfast.    [provider]  famotidine (PEPCID AC MAXIMUM STRENGTH) 20 MG tablet Take 20 mg by mouth at bedtime.    [provider]  ferric citrate (AURYXIA) 1 GM 210 MG(Fe) tablet Take 420 mg by mouth 3 (three) times daily with meals.     [provider]  flavoxATE (URISPAS) 100 MG tablet Take 1 tablet (100 mg total) by mouth 3 (three) times daily as needed (bladder spasms, dysuria). Patient taking differently: Take 100 mg by mouth 3 (three) times daily.  06/30/19   Shelly Coss, MD  fluticasone (FLONASE) 50 MCG/ACT nasal spray Place 2 sprays into both nostrils daily as needed for allergies or rhinitis.    [provider]  gabapentin (NEURONTIN) 100 MG capsule Take 1 capsule (100 mg total) by mouth 3 (three) times daily. Patient taking differently: Take 100 mg by mouth at bedtime.  04/30/19   Love, Ivan Anchors, PA-C  HYDROcodone-acetaminophen (NORCO/VICODIN) 5-325 MG tablet Take 1 tablet by mouth 6 (six) times daily. 12/13/19   [provider]  levothyroxine (SYNTHROID) 75 MCG tablet Take 1 tablet (75 mcg total) by mouth daily before breakfast. 04/30/19   Love, Ivan Anchors, PA-C  lidocaine-prilocaine (EMLA) cream Apply 1 application topically See admin instructions. APPLY SMALL AMOUNT TO ACCESS SITE (AVF) 1 TO 2 HOURS BEFORE DIALYSIS. COVER WITH OCCLUSIVE DRESSING (SARAN WRAP) 10/07/19   [provider]  LORazepam (ATIVAN) 1 MG tablet Take 0.5 tablets (0.5 mg total) by mouth 3 (three) times daily as needed (vertigo). 02/03/20   Marquist Binstock, Corene Cornea, MD  Melatonin 3 MG TABS Take 3 mg by mouth at bedtime.    [provider]  midodrine (PROAMATINE) 10 MG tablet Take 10 mg by mouth See admin instructions. Take 10 mg by mouth 30 minutes before dialysis  only on Tues/Thurs/Sat    [provider]  polyethylene glycol (MIRALAX / GLYCOLAX) 17 g packet Take 17 g by mouth 2 (two) times daily. Patient taking differently: Take 17 g by mouth daily as needed for mild constipation.  05/01/19   Love, Ivan Anchors, PA-C  senna (  SENOKOT) 8.6 MG TABS tablet Take 1 tablet by mouth at bedtime.    [provider]  simvastatin (ZOCOR) 40 MG tablet Take 1 tablet (40 mg total) by mouth at bedtime. 04/21/19   Dessa Phi, DO  zinc gluconate 50 MG tablet Take 50 mg by mouth daily.    [provider]    Allergies    Baclofen, Tape, Other, Codeine, Dextromethorphan-guaifenesin, and Latex  Review of Systems   Review of Systems  Constitutional: Positive for fatigue.  HENT: Positive for congestion, ear pain and postnasal drip.   Respiratory: Positive for shortness of breath.   Cardiovascular: Positive for chest pain.  Gastrointestinal: Positive for nausea. Negative for vomiting.  Musculoskeletal: Negative for back pain.  All other systems reviewed and are negative.   Physical Exam Updated Vital Signs BP 118/73   Pulse 100   Temp 98.2 F (36.8 C) (Oral)   Resp 12   Wt 83.9 kg   SpO2 94%   BMI 30.79 kg/m   Physical Exam Vitals and nursing note reviewed.  Constitutional:      Appearance: She is well-developed.  HENT:     Head: Normocephalic and atraumatic.  Cardiovascular:     Rate and Rhythm: Normal rate and regular rhythm.  Pulmonary:     Effort: No respiratory distress.     Breath sounds: No stridor.  Chest:     Chest wall: No mass or deformity.  Abdominal:     General: There is no distension.  Musculoskeletal:        General: Normal range of motion.     Cervical back: Normal range of motion.     Right lower leg: No edema.     Left lower leg: No edema.  Skin:    General: Skin is warm and dry.  Neurological:     General: No focal deficit present.     Mental Status: She is alert.     ED Results / Procedures /  Treatments   Labs (all labs ordered are listed, but only abnormal results are displayed) Labs Reviewed  BASIC METABOLIC PANEL - Abnormal; Notable for the following components:      Result Value   Chloride 90 (*)    Glucose, Bld 137 (*)    BUN 32 (*)    Creatinine, Ser 6.70 (*)    GFR calc non Af Amer 6 (*)    GFR calc Af Amer 6 (*)    Anion gap 17 (*)    All other components within normal limits  CBC - Abnormal; Notable for the following components:   RBC 3.42 (*)    Hemoglobin 10.0 (*)    HCT 32.1 (*)    RDW 18.1 (*)    All other components within normal limits  TROPONIN I (HIGH SENSITIVITY)  TROPONIN I (HIGH SENSITIVITY)    EKG EKG Interpretation  Date/Time:  Wednesday February 02 2020 17:41:39 EST Ventricular Rate:  104 PR Interval:  204 QRS Duration: 120 QT Interval:  326 QTC Calculation: 428 R Axis:   4 Text Interpretation: Atrial-sensed ventricular-paced rhythm Abnormal ECG No significant change since last tracing Confirmed by Merrily Pew [phone]) on 02/03/2020 3:25:31 AM   Radiology DG Chest Portable 1 View  Result Date: 02/03/2020 CLINICAL DATA:  Shortness of breath EXAM: PORTABLE CHEST 1 VIEW COMPARISON:  December 27, 2019 FINDINGS: The heart size and mediastinal contours are unchanged with mild cardiomegaly. Aortic knob calcifications. Right-sided pacemaker seen with the lead tips in right atrium right  ventricle. There is prominence of the central pulmonary vasculature. Healed posterior rib fracture seen within the right chest. IMPRESSION: Cardiomegaly and mild pulmonary vascular congestion. Electronically Signed   By: Prudencio Pair M.D.   On: 02/03/2020 03:28    Procedures Procedures (including critical care time)  Medications Ordered in ED Medications - No data to display  ED Course  I have reviewed the triage vital signs and the nursing notes.  Pertinent labs & imaging results that were available during my care of the patient were reviewed by me and  considered in my medical decision making (see chart for details).    MDM Rules/Calculators/A&P                      Patient with multiple symptoms.  Hard to really differentiate which 1 is the one that is most concerned to bring her in.  Ultimately she has vertigo likely secondary to a right middle ear effusion.  No evidence of ear infection will need referral to otolaryngology for that. She also states that the vertigo she had the past they tried to use meclizine is caused her to have chest pain or shortness of breath so she stopped it because she thought she might be having allergic reaction.  She did have any hives.  But then the chest pain shortness of breath is intermittently present even over the last couple days and she has not taken the medication.  Her EKG looks okay x-ray looks okay story is very atypical for ACS.  Low suspicion for PE.  She will follow-up with her cardiologist for further work-up of this.  Final Clinical Impression(s) / ED Diagnoses Final diagnoses:  Vertigo  Dyspnea, unspecified type  Chest pain, unspecified type    Rx / DC Orders ED Discharge Orders         Ordered    LORazepam (ATIVAN) 1 MG tablet  3 times daily PRN     02/03/20 0426           Luisfernando Brightwell, Corene Cornea, MD 02/03/20 [phone]

## 2020-03-29 NOTE — Progress Notes (Signed)
Primary Physician/Referring:  [name], MD  Patient ID: Erika Fry, female    DOB: 1946/03/08, 74 y.o.   MRN: [id number]  Chief Complaint  Patient presents with  . Follow-up    6 month   HPI:    Erika Fry  is a 74 y.o. Caucasian female with hypertension, CKD now on HD, type 2 DM, hyperlipidemia, s/p Medtronic dual chamber pacemaker for symptomatic high grade AV block, hypothyroidism, anemia, osteopenia, bilateral chronic lower extremity venous insufficiency.   Patient was diagnosed with COVID-19 pneumonia on 12/25/2019.  Since then she has been on dialysis.  She made an earlier appointment to see me in view of worsening dyspnea on exertion and persistent cough and not feeling well and lack of energy.  Accompanied by her daughter at the bedside.  She was also seen in the emergency room on 02/02/2020 with vertigo and atypical chest pain and discharged home after negative cardiac work-up.  Past Medical History:  Diagnosis Date  . Arthritis    "knees, thumbs" (03/25/2018)  . AV block, Mobitz 2 03/25/2018  . CKD (chronic kidney disease), stage IV ([place])   . Complication of anesthesia    Reports had hard time waking up in the past  . Diet-controlled diabetes mellitus ([place])   . Encounter for care of pacemaker 09/07/2019  . End stage renal failure on dialysis ([place])    T, Th, Sat [employer]  . GERD (gastroesophageal reflux disease)   . Gout    "on daily RX" (03/25/2018)  . Heart murmur   . High cholesterol   . Hypertension   . Hypothyroidism   . Iron deficiency anemia    "had to get an iron infusion"  . Migraine    "used to have them growing up" (03/25/2018)  . Presence of permanent cardiac pacemaker 03/25/2018   Past Surgical History:  Procedure Laterality Date  . APPENDECTOMY    . AV FISTULA PLACEMENT Right 06/29/2019   Procedure: ARTERIOVENOUS (AV) FISTULA CREATION RIGHT UPPER  ARM;  Surgeon: Waynetta Sandy, MD;  Location: [facility];  Service: Vascular;   Laterality: Right;  . BASCILIC VEIN TRANSPOSITION Right 09/08/2019   Procedure: BASILIC VEIN TRANSPOSITION RIGHT SECOND STAGE;  Surgeon: Waynetta Sandy, MD;  Location: [facility];  Service: Vascular;  Laterality: Right;  . BIOPSY  05/19/2018   Procedure: BIOPSY;  Surgeon: Ladene Artist, MD;  Location: [facility];  Service: Endoscopy;;  . Consuela Mimes W/ RETROGRADES Bilateral 01/14/2019   Procedure: CYSTOSCOPY WITH RETROGRADE PYELOGRAM BILATERAL HYDRODISTENTION;  Surgeon: Ardis Hughs, MD;  Location: [facility];  Service: Urology;  Laterality: Bilateral;  . ESOPHAGOGASTRODUODENOSCOPY N/A 05/19/2018   Procedure: ESOPHAGOGASTRODUODENOSCOPY (EGD);  Surgeon: Ladene Artist, MD;  Location: [facility];  Service: Endoscopy;  Laterality: N/A;  . ESOPHAGOGASTRODUODENOSCOPY (EGD) WITH PROPOFOL N/A 01/28/2019   Procedure: ESOPHAGOGASTRODUODENOSCOPY (EGD) WITH PROPOFOL;  Surgeon: Rush Landmark Telford Nab., MD;  Location: [place];  Service: Gastroenterology;  Laterality: N/A;  . INSERT / REPLACE / REMOVE PACEMAKER  03/25/2018  . IR FLUORO GUIDE CV LINE RIGHT  05/18/2018  . IR FLUORO GUIDE CV LINE RIGHT  06/24/2019  . IR LUMBAR DISC ASPIRATION W/IMG GUIDE  05/15/2018  . IR NEPHROSTOMY EXCHANGE RIGHT  05/28/2019  . IR NEPHROSTOMY EXCHANGE RIGHT  07/19/2019  . IR NEPHROSTOMY PLACEMENT RIGHT  04/09/2019  . IR REMOVAL TUN CV CATH W/O FL  07/23/2018  . IR US GUIDE VASC ACCESS RIGHT  05/18/2018  . IR US GUIDE VASC ACCESS RIGHT  06/24/2019  . KNEE ARTHROSCOPY Bilateral   . PACEMAKER IMPLANT N/A 03/25/2018   Procedure: PACEMAKER IMPLANT;  Surgeon: Evans Lance, MD;  Location: [place] [facility];  Service: Cardiovascular;  Laterality: N/A;  . PACEMAKER PLACEMENT Left 03/2018  . POLYPECTOMY  01/28/2019   Procedure: POLYPECTOMY;  Surgeon: Mansouraty, Telford Nab., MD;  Location: [place];  Service: Gastroenterology;;  . RIGHT HEART CATH N/A 03/20/2018   Procedure: RIGHT HEART CATH;  Surgeon: Nigel Mormon, MD;  Location: [place] [facility];  Service: Cardiovascular;  Laterality: N/A;  . TEE WITHOUT CARDIOVERSION N/A 03/20/2018   Procedure: TRANSESOPHAGEAL ECHOCARDIOGRAM (TEE);  Surgeon: Nigel Mormon, MD;  Location: [facility]COPY;  Service: Cardiovascular;  Laterality: N/A;  . TONSILLECTOMY    . URETEROSCOPY WITH HOLMIUM LASER LITHOTRIPSY Right 01/14/2019   Procedure: URETEROSCOPY WITH HOLMIUM LASER LITHOTRIPSY BLADDER BIOPSIES RIGHT STENT PLACEMENT;  Surgeon: Ardis Hughs, MD;  Location: [facility];  Service: Urology;  Laterality: Right;   Family History  Problem Relation Age of Onset  . Hypertension Mother   . Diabetes Mellitus II Father   . Heart disease Father   . Heart attack Father   . Gastric cancer Brother   . Diabetes Mellitus II Brother   . Stomach cancer Brother     Social History   Tobacco Use  . Smoking status: Never Smoker  . Smokeless tobacco: Never Used  Substance Use Topics  . Alcohol use: Not Currently   Marital Status: Widowed  ROS  Review of Systems  Constitution: Positive for malaise/fatigue.  Cardiovascular: Positive for dyspnea on exertion and leg swelling (chronic). Negative for chest pain.  Respiratory: Positive for cough.   Musculoskeletal: Positive for arthritis and back pain.  Gastrointestinal: Positive for heartburn. Negative for melena.   Objective  Blood pressure 103/65, pulse 68, temperature (!) 96.8 F (36 C), temperature source Temporal, resp. rate 16, height 5\' 5"  (1.651 m), weight 184 lb (83.5 kg).  Vitals with BMI 03/31/2020 02/03/2020 02/03/2020  Height 5\' 5"  - -  Weight 184 lbs - -  BMI 93.73 - -  Systolic 428 768 115  Diastolic 65 73 66  Pulse 68 100 68     Physical Exam  Constitutional: She appears well-developed. No distress.  Mildly obese  Cardiovascular: Normal rate, regular rhythm, S1 normal, S2 normal and intact distal pulses. Exam reveals distant heart sounds. Exam reveals no gallop.  No murmur heard. Pulses:       Carotid pulses are 2+ on the right side and 2+ on the left side.      Femoral pulses are 0 on the right side and 0 on the left side.      Popliteal pulses are 0 on the right side and 0 on the left side.       Dorsalis pedis pulses are 1+ on the right side and 1+ on the left side.       Posterior tibial pulses are 0 on the right side and 0 on the left side.  There is 2+ bilateral leg edema with chronic dermatitis changes noted.  Femoral and popliteal pulse could not be felt due to her body habitus.  Faint bilateral carotid bruit heard.   Pulmonary/Chest: Effort normal and breath sounds normal. No accessory muscle usage. No respiratory distress.  Abdominal: Soft. Bowel sounds are normal.  Urostomy drain noted    Laboratory examination:   Recent Labs    12/28/19 0401 12/29/19 0332 02/02/20 2138  NA 132* 134*  135  K 5.0 4.6 4.7  CL 88* 92* 90*  CO2 23 26 28   GLUCOSE 201* 176* 137*  BUN 104* 71* 32*  CREATININE 9.15* 6.81* 6.70*  CALCIUM 9.2 9.0 10.1  GFRNONAA 4* 5* 6*  GFRAA 4* 6* 6*   CrCl cannot be calculated (Patient's most recent lab result is older than the maximum 21 days allowed.).  CMP Latest Ref Rng & Units 02/02/2020 12/29/2019 12/28/2019  Glucose 70 - 99 mg/dL 137(H) 176(H) 201(H)  BUN 8 - 23 mg/dL 32(H) 71(H) 104(H)  Creatinine 0.44 - 1.00 mg/dL 6.70(H) 6.81(H) 9.15(H)  Sodium 135 - 145 mmol/L 135 134(L) 132(L)  Potassium 3.5 - 5.1 mmol/L 4.7 4.6 5.0  Chloride 98 - 111 mmol/L 90(L) 92(L) 88(L)  CO2 22 - 32 mmol/L 28 26 23   Calcium 8.9 - 10.3 mg/dL 10.1 9.0 9.2  Total Protein 6.5 - 8.1 g/dL - 6.0(L) 6.7  Total Bilirubin 0.3 - 1.2 mg/dL - 0.6 0.7  Alkaline Phos 38 - 126 U/L - 95 103  AST 15 - 41 U/L - 15 16  ALT 0 - 44 U/L - 13 14   CBC Latest Ref Rng & Units 02/02/2020 12/29/2019 12/28/2019  WBC 4.0 - 10.5 K/uL 7.6 5.9 7.2  Hemoglobin 12.0 - 15.0 g/dL 10.0(L) 11.7(L) 12.8  Hematocrit 36.0 - 46.0 % 32.1(L) 35.7(L) 39.7  Platelets 150 - 400 K/uL 167 169 205   Lipid  Panel     Component Value Date/Time   TRIG 194 (H) 12/25/2019 1157   HEMOGLOBIN A1C Lab Results  Component Value Date   HGBA1C 6.3 (H) 04/30/2018   MPG 134.11 04/30/2018   TSH No results for input(s): TSH in the last 8760 hours.   BNP (last 3 results) Recent Labs    06/09/19 1717  BNP 138.5*   Medications and allergies   Allergies  Allergen Reactions  . Baclofen Other (See Comments)    Somnolence- PUT THE PATIENT INTO A COMA AND "ALMOST KILLED HER"   . Tape Other (See Comments)    SKIN WILL TEAR AND BRUISE EASILY!!  . Other Other (See Comments)    Patient has a high tolerance to antibiotics- has taken a lot of them during the course of her life  . Codeine Other (See Comments)    Increased pain and couldn't sleep  . Dextromethorphan-Guaifenesin Other (See Comments)    Unknown reaction  . Latex Rash    Certain briefs for incontinence break her out     Current Outpatient Medications  Medication Instructions  . albuterol (PROAIR HFA) 108 (90 Base) MCG/ACT inhaler 2 puffs, Inhalation, See admin instructions, Inhale 2 puffs into the lungs every four to six hours as needed for wheezing   . ascorbic acid (VITAMIN C) 500 mg, Oral, Daily  . B Complex-C-Zn-Folic Acid (DIALYVITE 353 WITH ZINC) 0.8 MG TABS 1 tablet, Oral, Daily  . Biotin 10,000 mcg, Oral, Daily with breakfast  . cetirizine (ZYRTEC) 10 mg, Oral, Daily at bedtime  . ferric citrate (AURYXIA) 420 mg, Oral, 3 times daily with meals  . flavoxATE (URISPAS) 100 mg, Oral, 3 times daily PRN  . fluticasone (FLONASE) 50 MCG/ACT nasal spray 2 sprays, Each Nare, Daily PRN  . gabapentin (NEURONTIN) 100 mg, Oral, 3 times daily  . HYDROcodone-acetaminophen (NORCO/VICODIN) 5-325 MG tablet 1 tablet, Oral, 6 times daily  . levothyroxine (SYNTHROID) 75 mcg, Oral, Daily before breakfast  . lidocaine-prilocaine (EMLA) cream 1 application, Topical, See admin instructions, APPLY SMALL AMOUNT TO ACCESS SITE (  AVF) 1 TO 2 HOURS BEFORE  DIALYSIS. COVER WITH OCCLUSIVE DRESSING (SARAN WRAP)  . LORazepam (ATIVAN) 0.5 mg, Oral, 3 times daily PRN  . melatonin 3 mg, Oral, Daily at bedtime  . midodrine (PROAMATINE) 10 mg, Oral, See admin instructions, Take 10 mg by mouth 30 minutes before dialysis only on Tues/Thurs/Sat  . omeprazole (PRILOSEC) 20 mg, Oral, Daily before breakfast  . polyethylene glycol (MIRALAX / GLYCOLAX) 17 g, Oral, 2 times daily  . senna (SENOKOT) 8.6 MG TABS tablet 1 tablet, Oral, Daily at bedtime  . simvastatin (ZOCOR) 40 mg, Oral, Daily at bedtime  . Vitamin D3 2,000 mg, Oral, Daily with breakfast  . zinc gluconate 50 mg, Oral, Daily   Radiology:   CT Abdomen Pelvis Wo Contrast 05/10/2019: 1. Right percutaneous nephrostomy tube in place with resolved right hydronephrosis.  2. Similar appearing bilateral nonobstructive nephrolithiasis.  3. Aortic atherosclerosis.  DG Chest 02/03/2020:  Cardiomegaly and mild pulmonary vascular congestion.  Cardiac Studies:   Lexiscan myoview stress test 02/13/2018:  1. Pharmacologic stress testing was performed with intravenous administration of .4 mg of Lexiscan over a 10-15 seconds infusion. Resting hypertension 180/84 mmHg. Exercise capacity not assessed. Stress symptoms included dyspnea, dizziness. Stress EKG is non diagnostic for ischemia as it is a pharmacologic stress.  2. The overall quality of the study is excellent. There is no evidence of abnormal lung activity. Stress and rest SPECT images demonstrate homogeneous tracer distribution throughout the myocardium. Gated SPECT imaging reveals normal myocardial thickening and wall motion. The left ventricular ejection fraction was normal (67%).   3. Low risk study. Overall Impression:  Poor exercise capacity. Inadequate assessment for inotropic competense, limited due to poor functional capacity.  Recoomendation: Consider Holter/event monitor if high clinical suspicion for high grade AV block . Continue primary/secondary  prevention.  Carotid artery duplex 03/10/2018: Minimal stenosis in the right internal carotid artery (1-15%). Stenosis in the left internal carotid artery (16-49%). Antegrade right vertebral artery flow. Antegrade left vertebral artery flow. Follow up in one year is appropriate if clinically indicated.  TEE 03/20/2018: - Left ventricle: There was moderate concentric hypertrophy with  severe basal septal hypertrophy- likely reason for LVOT gradient.  No SAM, no sub aortic membrane. Hyperdynamic LV> LVEF >70%. Wall  motion was normal; there were no regional wall motion abnormalities. - Aortic valve: Mild annular calcification. Normal leaflets with normal excursion. Mean PG 33 mmHg, Vmax 4 m/sec, however gradient arising throughout LV outflow tract without significant aortic valvular stenosis.  AVA by continuity equation at least 1.4 cm2. - Mitral valve: Mildly to moderately calcified annulus. Normal   thickness leaflets . There was mild regurgitation. - Left atrium: No evidence of thrombus in the atrial cavity or   appendage. Impressions:  - Hyperdynamic LV with severe basal septal hypertrophy and LVOT   obstruction. No significant valvular stenosis.  Right heart cath 03/20/2018: RA pressure 3 mmhg RVSP 48 mmHg, RVEDP 6 mmhg PAP 42/9 mmhg, Mean Pap 20 mmhg CO 6.8 L/min, CI 3.3 L/min/m2 No pulmonary hypertension. Normal cardiac output  Hospital echocardiogram 02/16/2019:  1. The left ventricle has normal systolic function, with an ejection fraction of 55-60%. The cavity size was normal. There is moderately increased left ventricular wall thickness. Left ventricular diastolic Doppler parameters are consistent with  impaired relaxation. Elevated left atrial and left ventricular end-diastolic pressures The E/e' is >15. No evidence of left ventricular regional wall motion abnormalities.  2. The right ventricle has normal systolic function. The cavity was normal.  There is no increase in right  ventricular wall thickness.  3. Left atrial size was severely dilated.  4. Right atrial size was mildly dilated.  5. The mitral valve is degenerative. Mild thickening of the mitral valve leaflet. There is mild to moderate mitral annular calcification present.  6. The aortic valve is tricuspid Mild sclerosis of the aortic valve. Aortic valve regurgitation was not assessed by color flow Doppler.  7. The inferior vena cava was dilated in size with >50% respiratory variability.  8. Small pericardial effusion.  9. The pericardial effusion is circumferential.  Vascular Ultrasound Lower Extremity Venous 06/09/2019: Right: There is no evidence of deep vein thrombosis in the lower extremity. However, portions of this examination were limited- see technologist comments above. No cystic structure found in the popliteal fossa.  Left: There is no evidence of deep vein thrombosis in the lower extremity. However, portions of this examination were limited- see technologist comments above. No cystic structure found in the popliteal fossa.   Scheduled Remote pacemaker check 03/09/2020:  There was a 0 % cumulative atrial arrhythmia burden. Battery longevity is 6.2 years. RA pacing is 3.7 %, RV pacing is 99.7 %.  Scheduled  In office pacemaker check 09/29/19: Single (S)/Dual (D)/BV: D Presenting ASVP Pacemaker dependant:  Yes.  ASVP @35 /min. Longevity 6 Years. Magnet rate: >85%. AP 5%, VP 99%. BP NA AMS Episodes 4.  AT/AF burden <1% . Longest 5 min. EGM  AT. Latest 07/16/19. HVR 0,  Lead measurements: Stable. Thoracic impedance: NA Histogram: Low (L)/normal (N)/high (H)  Normal. Patient activity Low.  Observations: Normal device function. Changes: None  EKG  EKG 03/31/2020: Sinus rhythm and first-degree AV block at the rate of 97 bpm, ventricularly paced rhythm, suspect His bundle pacing.  No further analysis.  No significant change from 02/03/2020.   Assessment     ICD-10-CM   1. Dyspnea on exertion   R06.00 albuterol (PROAIR HFA) 108 (90 Base) MCG/ACT inhaler    omeprazole (PRILOSEC) 20 MG capsule    PCV ECHOCARDIOGRAM COMPLETE  2. Chronic heart failure with preserved ejection fraction (HCC)  I50.32 EKG 12-Lead    PCV ECHOCARDIOGRAM COMPLETE  3. Pacemaker Medtronic Azure XT DR MRI Dual Chamber Pacemaker 03/25/2018  Z95.0   4. Gastroesophageal reflux disease without esophagitis  K21.9 omeprazole (PRILOSEC) 20 MG capsule  5. End-stage renal disease on hemodialysis ([place])  N18.6    Z99.2      Meds ordered this encounter  Medications  . albuterol (PROAIR HFA) 108 (90 Base) MCG/ACT inhaler    Sig: Inhale 2 puffs into the lungs See admin instructions. Inhale 2 puffs into the lungs every four to six hours as needed for wheezing    Dispense:  1 g    Refill:  3  . omeprazole (PRILOSEC) 20 MG capsule    Sig: Take 1 capsule (20 mg total) by mouth daily before breakfast.    Dispense:  30 capsule    Refill:  2    Medications Discontinued During This Encounter  Medication Reason  . famotidine (PEPCID AC MAXIMUM STRENGTH) 20 MG tablet Ineffective  . albuterol (PROAIR HFA) 108 (90 Base) MCG/ACT inhaler Reorder    Recommendations:   Erika Fry  is a 74 y.o. Caucasian female with hypertension, CKD now on HD, type 2 DM, hyperlipidemia, s/p Medtronic dual chamber pacemaker for symptomatic high grade AV block, hypothyroidism, anemia, osteopenia, bilateral chronic lower extremity venous insufficiency.   Patient was diagnosed with COVID-19 pneumonia on 12/25/2019.  Since then she has been on dialysis.  She made an earlier appointment to see me in view of worsening dyspnea on exertion and persistent cough and not feeling well and lack of energy.  I reviewed her hospital records, labs updated.  I do not think she is in acute decompensated heart failure.  Lungs are clear to auscultate.  I have discussed post viral syndrome, dyspnea is related to both deconditioning, obesity and chronic diastolic  heart failure.  She also has a complaint of GERD which may be contributing.  Discontinue Pepcid and switch her to omeprazole.  I refilled prescription for albuterol MDI as she continues to have occasional episodes of wheezing and this is related to post COVID-19 pneumonia and airway irritation.  I will check obtain echocardiogram to evaluate LV function and also pulmonary hypertension.  Her recent pacemaker transmission reviewed with the patient and her daughter.  Functioning normally.  I'll like to see her back in 6 weeks.  Pacemaker is functioning normally.   Adrian Prows, MD, [facility] 03/31/2020, 4:18 PM [facility] Pager: [phone] Office: [phone]

## 2020-03-31 ENCOUNTER — Other Ambulatory Visit: Payer: Self-pay

## 2020-03-31 ENCOUNTER — Encounter: Payer: Self-pay | Admitting: Cardiology

## 2020-03-31 ENCOUNTER — Ambulatory Visit: Payer: Medicare Other | Admitting: Cardiology

## 2020-03-31 VITALS — BP 103/65 | HR 68 | Temp 96.8°F | Resp 16 | Ht 65.0 in | Wt 184.0 lb

## 2020-03-31 DIAGNOSIS — N186 End stage renal disease: Secondary | ICD-10-CM

## 2020-03-31 DIAGNOSIS — I5032 Chronic diastolic (congestive) heart failure: Secondary | ICD-10-CM

## 2020-03-31 DIAGNOSIS — R0609 Other forms of dyspnea: Secondary | ICD-10-CM

## 2020-03-31 DIAGNOSIS — Z992 Dependence on renal dialysis: Secondary | ICD-10-CM

## 2020-03-31 DIAGNOSIS — Z950 Presence of cardiac pacemaker: Secondary | ICD-10-CM

## 2020-03-31 DIAGNOSIS — R0600 Dyspnea, unspecified: Secondary | ICD-10-CM

## 2020-03-31 DIAGNOSIS — K219 Gastro-esophageal reflux disease without esophagitis: Secondary | ICD-10-CM

## 2020-03-31 MED ORDER — OMEPRAZOLE 20 MG PO CPDR: 20.0000 mg | DELAYED_RELEASE_CAPSULE | Freq: Every day | ORAL | 2 refills | Status: DC

## 2020-03-31 MED ORDER — ALBUTEROL SULFATE HFA 108 (90 BASE) MCG/ACT IN AERS: 2.0000 | INHALATION_SPRAY | RESPIRATORY_TRACT | 3 refills | Status: DC

## 2020-04-17 ENCOUNTER — Other Ambulatory Visit: Payer: Self-pay

## 2020-04-17 ENCOUNTER — Ambulatory Visit: Payer: Medicare Other

## 2020-04-17 DIAGNOSIS — R0609 Other forms of dyspnea: Secondary | ICD-10-CM

## 2020-04-17 DIAGNOSIS — I5032 Chronic diastolic (congestive) heart failure: Secondary | ICD-10-CM

## 2020-04-17 DIAGNOSIS — R0600 Dyspnea, unspecified: Secondary | ICD-10-CM

## 2020-05-05 NOTE — Progress Notes (Signed)
Primary Physician/Referring:  [name], MD  Patient ID: Erika Fry, female    DOB: 1946/07/22, 74 y.o.   MRN: [id number]  Chief Complaint  Patient presents with  . Congestive Heart Failure  . Results    Echocardiogram   . Follow-up    6 week   HPI:    Erika Fry  is a 74 y.o. Caucasian female with hypertension, CKD now on HD, type 2 DM, hyperlipidemia, s/p Medtronic dual chamber pacemaker for symptomatic high grade AV block, hypothyroidism, anemia, osteopenia, bilateral chronic lower extremity venous insufficiency.   Patient was diagnosed with COVID-19 pneumonia on 12/25/2019.  Since then she has been markedly dyspneic. She was also seen in the emergency room on 02/02/2020 with vertigo and atypical chest pain and discharged home after negative cardiac work-up.  She has not had any acute decompensated heart failure.  Underwent echocardiogram and presents for follow-up.  Continues to complain of marked fatigue and also dyspnea on exertion even with minimal activity without PND or orthopnea.  No leg edema.  Past Medical History:  Diagnosis Date  . Arthritis    "knees, thumbs" (03/25/2018)  . AV block, Mobitz 2 03/25/2018  . CKD (chronic kidney disease), stage IV ([place])   . Complication of anesthesia    Reports had hard time waking up in the past  . Diet-controlled diabetes mellitus ([place])   . Encounter for care of pacemaker 09/07/2019  . End stage renal failure on dialysis ([place])    T, Th, Sat [employer]  . GERD (gastroesophageal reflux disease)   . Gout    "on daily RX" (03/25/2018)  . Heart murmur   . High cholesterol   . Hypothyroidism   . Iron deficiency anemia    "had to get an iron infusion"  . Migraine    "used to have them growing up" (03/25/2018)  . Presence of permanent cardiac pacemaker 03/25/2018   Past Surgical History:  Procedure Laterality Date  . APPENDECTOMY    . AV FISTULA PLACEMENT Right 06/29/2019   Procedure: ARTERIOVENOUS (AV) FISTULA  CREATION RIGHT UPPER  ARM;  Surgeon: Waynetta Sandy, MD;  Location: [place];  Service: Vascular;  Laterality: Right;  . BASCILIC VEIN TRANSPOSITION Right 09/08/2019   Procedure: BASILIC VEIN TRANSPOSITION RIGHT SECOND STAGE;  Surgeon: Waynetta Sandy, MD;  Location: [place];  Service: Vascular;  Laterality: Right;  . BIOPSY  05/19/2018   Procedure: BIOPSY;  Surgeon: Ladene Artist, MD;  Location: [place];  Service: Endoscopy;;  . Consuela Mimes W/ RETROGRADES Bilateral 01/14/2019   Procedure: CYSTOSCOPY WITH RETROGRADE PYELOGRAM BILATERAL HYDRODISTENTION;  Surgeon: Ardis Hughs, MD;  Location: [facility];  Service: Urology;  Laterality: Bilateral;  . ESOPHAGOGASTRODUODENOSCOPY N/A 05/19/2018   Procedure: ESOPHAGOGASTRODUODENOSCOPY (EGD);  Surgeon: Ladene Artist, MD;  Location: [facility] ENDOSCOPY;  Service: Endoscopy;  Laterality: N/A;  . ESOPHAGOGASTRODUODENOSCOPY (EGD) WITH PROPOFOL N/A 01/28/2019   Procedure: ESOPHAGOGASTRODUODENOSCOPY (EGD) WITH PROPOFOL;  Surgeon: Rush Landmark Telford Nab., MD;  Location: [place];  Service: Gastroenterology;  Laterality: N/A;  . INSERT / REPLACE / REMOVE PACEMAKER  03/25/2018  . IR FLUORO GUIDE CV LINE RIGHT  05/18/2018  . IR FLUORO GUIDE CV LINE RIGHT  06/24/2019  . IR LUMBAR DISC ASPIRATION W/IMG GUIDE  05/15/2018  . IR NEPHROSTOMY EXCHANGE RIGHT  05/28/2019  . IR NEPHROSTOMY EXCHANGE RIGHT  07/19/2019  . IR NEPHROSTOMY PLACEMENT RIGHT  04/09/2019  . IR REMOVAL TUN CV CATH W/O FL  07/23/2018  . IR US  GUIDE VASC ACCESS RIGHT  05/18/2018  . IR US GUIDE VASC ACCESS RIGHT  06/24/2019  . KNEE ARTHROSCOPY Bilateral   . PACEMAKER IMPLANT N/A 03/25/2018   Procedure: PACEMAKER IMPLANT;  Surgeon: Evans Lance, MD;  Location: [place] [facility];  Service: Cardiovascular;  Laterality: N/A;  . PACEMAKER PLACEMENT Left 03/2018  . POLYPECTOMY  01/28/2019   Procedure: POLYPECTOMY;  Surgeon: Mansouraty, Telford Nab., MD;  Location: [place];  Service:  Gastroenterology;;  . RIGHT HEART CATH N/A 03/20/2018   Procedure: RIGHT HEART CATH;  Surgeon: Nigel Mormon, MD;  Location: [place] [facility];  Service: Cardiovascular;  Laterality: N/A;  . TEE WITHOUT CARDIOVERSION N/A 03/20/2018   Procedure: TRANSESOPHAGEAL ECHOCARDIOGRAM (TEE);  Surgeon: Nigel Mormon, MD;  Location: [facility];  Service: Cardiovascular;  Laterality: N/A;  . TONSILLECTOMY    . URETEROSCOPY WITH HOLMIUM LASER LITHOTRIPSY Right 01/14/2019   Procedure: URETEROSCOPY WITH HOLMIUM LASER LITHOTRIPSY BLADDER BIOPSIES RIGHT STENT PLACEMENT;  Surgeon: Ardis Hughs, MD;  Location[facility];  Service: Urology;  Laterality: Right;   Family History  Problem Relation Age of Onset  . Hypertension Mother   . Diabetes Mellitus II Father   . Heart disease Father   . Heart attack Father   . Gastric cancer Brother   . Diabetes Mellitus II Brother   . Stomach cancer Brother     Social History   Tobacco Use  . Smoking status: Never Smoker  . Smokeless tobacco: Never Used  Substance Use Topics  . Alcohol use: Not Currently   Marital Status: Widowed  ROS  Review of Systems  Constitution: Negative for malaise/fatigue.  Cardiovascular: Positive for dyspnea on exertion. Negative for chest pain.  Respiratory: Negative for cough.   Musculoskeletal: Positive for arthritis and back pain.  Gastrointestinal: Negative for heartburn and melena.  Neurological: Positive for dizziness.   Objective  Blood pressure 101/60, pulse 91, resp. rate 16, height 5\' 5"  (1.651 m), weight 181 lb (82.1 kg), SpO2 99 %.  Vitals with BMI 05/08/2020 03/31/2020 02/03/2020  Height 5\' 5"  5\' 5"  -  Weight 181 lbs 184 lbs -  BMI 61.95 09.32 -  Systolic 671 245 809  Diastolic 60 65 73  Pulse 91 68 100     Physical Exam  Constitutional: She appears well-developed. No distress.  Mildly obese  Cardiovascular: Normal rate, regular rhythm, S1 normal, S2 normal and intact distal pulses. Exam reveals  distant heart sounds. Exam reveals no gallop.  No murmur heard. Pulses:      Carotid pulses are 2+ on the right side and 2+ on the left side.      Femoral pulses are 0 on the right side and 0 on the left side.      Popliteal pulses are 0 on the right side and 0 on the left side.       Dorsalis pedis pulses are 1+ on the right side and 1+ on the left side.       Posterior tibial pulses are 0 on the right side and 0 on the left side.  There is 2+ bilateral leg edema with chronic dermatitis changes noted.  Femoral and popliteal pulse could not be felt due to her body habitus.  Faint bilateral carotid bruit heard.  No JVD.  Pulmonary/Chest: Effort normal and breath sounds normal. No accessory muscle usage. No respiratory distress.  Abdominal: Soft. Bowel sounds are normal.  Urostomy drain noted    Laboratory examination:   Recent Labs  12/28/19 0401 12/29/19 0332 02/02/20 2138  NA 132* 134* 135  K 5.0 4.6 4.7  CL 88* 92* 90*  CO2 23 26 28   GLUCOSE 201* 176* 137*  BUN 104* 71* 32*  CREATININE 9.15* 6.81* 6.70*  CALCIUM 9.2 9.0 10.1  GFRNONAA 4* 5* 6*  GFRAA 4* 6* 6*   CrCl cannot be calculated (Patient's most recent lab result is older than the maximum 21 days allowed.).  CMP Latest Ref Rng & Units 02/02/2020 12/29/2019 12/28/2019  Glucose 70 - 99 mg/dL 137(H) 176(H) 201(H)  BUN 8 - 23 mg/dL 32(H) 71(H) 104(H)  Creatinine 0.44 - 1.00 mg/dL 6.70(H) 6.81(H) 9.15(H)  Sodium 135 - 145 mmol/L 135 134(L) 132(L)  Potassium 3.5 - 5.1 mmol/L 4.7 4.6 5.0  Chloride 98 - 111 mmol/L 90(L) 92(L) 88(L)  CO2 22 - 32 mmol/L 28 26 23   Calcium 8.9 - 10.3 mg/dL 10.1 9.0 9.2  Total Protein 6.5 - 8.1 g/dL - 6.0(L) 6.7  Total Bilirubin 0.3 - 1.2 mg/dL - 0.6 0.7  Alkaline Phos 38 - 126 U/L - 95 103  AST 15 - 41 U/L - 15 16  ALT 0 - 44 U/L - 13 14   CBC Latest Ref Rng & Units 02/02/2020 12/29/2019 12/28/2019  WBC 4.0 - 10.5 K/uL 7.6 5.9 7.2  Hemoglobin 12.0 - 15.0 g/dL 10.0(L) 11.7(L) 12.8  Hematocrit  36.0 - 46.0 % 32.1(L) 35.7(L) 39.7  Platelets 150 - 400 K/uL 167 169 205   Lipid Panel     Component Value Date/Time   TRIG 194 (H) 12/25/2019 1157   HEMOGLOBIN A1C Lab Results  Component Value Date   HGBA1C 6.3 (H) 04/30/2018   MPG 134.11 04/30/2018   TSH No results for input(s): TSH in the last 8760 hours.   BNP (last 3 results) Recent Labs    06/09/19 1717  BNP 138.5*   External Labs: Triglycerides 194.000 12/25/2019  HDL 29 MG/DL 01/07/2019 LDL 213.000 m 01/07/2019 Cholesterol, total 304.000 m 01/07/2019  A1C 5.600 % 10/13/2019 TSH 0.700 micr 01/07/2019  Medications and allergies   Allergies  Allergen Reactions  . Baclofen Other (See Comments)    Somnolence- PUT THE PATIENT INTO A COMA AND "ALMOST KILLED HER"   . Tape Other (See Comments)    SKIN WILL TEAR AND BRUISE EASILY!!  . Other Other (See Comments)    Patient has a high tolerance to antibiotics- has taken a lot of them during the course of her life  . Codeine Other (See Comments)    Increased pain and couldn't sleep  . Dextromethorphan-Guaifenesin Other (See Comments)    Unknown reaction  . Latex Rash    Certain briefs for incontinence break her out     Current Outpatient Medications  Medication Instructions  . albuterol (PROAIR HFA) 108 (90 Base) MCG/ACT inhaler 2 puffs, Inhalation, See admin instructions, Inhale 2 puffs into the lungs every four to six hours as needed for wheezing   . Alpha-Lipoic Acid 150 MG CAPS Oral  . ascorbic acid (VITAMIN C) 500 mg, Oral, Daily  . B Complex-C-Zn-Folic Acid (DIALYVITE 921 WITH ZINC) 0.8 MG TABS 1 tablet, Oral, Daily  . Biotin 10,000 mcg, Oral, Daily with breakfast  . cetirizine (ZYRTEC) 10 mg, Oral, Daily at bedtime  . ferric citrate (AURYXIA) 420 mg, Oral, 3 times daily with meals  . flavoxATE (URISPAS) 100 mg, Oral, 3 times daily PRN  . fluticasone (FLONASE) 50 MCG/ACT nasal spray 2 sprays, Each Nare, Daily PRN  . gabapentin (  NEURONTIN) 100 mg, Oral, 3 times  daily  . HYDROcodone-acetaminophen (NORCO/VICODIN) 5-325 MG tablet 1 tablet, Oral, 6 times daily  . levothyroxine (SYNTHROID) 75 mcg, Oral, Daily before breakfast  . lidocaine-prilocaine (EMLA) cream 1 application, Topical, See admin instructions, APPLY SMALL AMOUNT TO ACCESS SITE (AVF) 1 TO 2 HOURS BEFORE DIALYSIS. COVER WITH OCCLUSIVE DRESSING (SARAN WRAP)  . midodrine (PROAMATINE) 10 mg, Oral, See admin instructions, Take 10 mg by mouth 30 minutes before dialysis only on Tues/Thurs/Sat  . omeprazole (PRILOSEC) 20 mg, Oral, Daily before breakfast  . polyethylene glycol (MIRALAX / GLYCOLAX) 17 g, Oral, 2 times daily  . senna (SENOKOT) 8.6 MG TABS tablet 1 tablet, Oral, Daily at bedtime  . simvastatin (ZOCOR) 40 mg, Oral, Daily at bedtime  . Vitamin D3 2,000 mg, Oral, Daily with breakfast  . zinc gluconate 50 mg, Oral, Daily   No orders of the defined types were placed in this encounter.   Radiology:   CT Abdomen Pelvis Wo Contrast 05/10/2019: 1. Right percutaneous nephrostomy tube in place with resolved right hydronephrosis.  2. Similar appearing bilateral nonobstructive nephrolithiasis.  3. Aortic atherosclerosis.  DG Chest 02/03/2020:  Cardiomegaly and mild pulmonary vascular congestion.  Cardiac Studies:   Lexiscan myoview stress test 02/13/2018:  1. Pharmacologic stress testing was performed with intravenous administration of .4 mg of Lexiscan over a 10-15 seconds infusion. Resting hypertension 180/84 mmHg. Exercise capacity not assessed. Stress symptoms included dyspnea, dizziness. Stress EKG is non diagnostic for ischemia as it is a pharmacologic stress.  2. The overall quality of the study is excellent. There is no evidence of abnormal lung activity. Stress and rest SPECT images demonstrate homogeneous tracer distribution throughout the myocardium. Gated SPECT imaging reveals normal myocardial thickening and wall motion. The left ventricular ejection fraction was normal (67%).     3. Low risk study. Overall Impression:  Poor exercise capacity. Inadequate assessment for inotropic competense, limited due to poor functional capacity.  Recoomendation: Consider Holter/event monitor if high clinical suspicion for high grade AV block . Continue primary/secondary prevention.  Carotid artery duplex 03/10/2018: Minimal stenosis in the right internal carotid artery (1-15%). Stenosis in the left internal carotid artery (16-49%). Antegrade right vertebral artery flow. Antegrade left vertebral artery flow. Follow up in one year is appropriate if clinically indicated.  TEE 03/20/2018: - Left ventricle: There was moderate concentric hypertrophy with  severe basal septal hypertrophy- likely reason for LVOT gradient.  No SAM, no sub aortic membrane. Hyperdynamic LV> LVEF >70%. Wall  motion was normal; there were no regional wall motion abnormalities. - Aortic valve: Mild annular calcification. Normal leaflets with normal excursion. Mean PG 33 mmHg, Vmax 4 m/sec, however gradient arising throughout LV outflow tract without significant aortic valvular stenosis.  AVA by continuity equation at least 1.4 cm2. - Mitral valve: Mildly to moderately calcified annulus. Normal   thickness leaflets . There was mild regurgitation. - Left atrium: No evidence of thrombus in the atrial cavity or   appendage. Impressions:  - Hyperdynamic LV with severe basal septal hypertrophy and LVOT   obstruction. No significant valvular stenosis.  Right heart cath 03/20/2018: RA pressure 3 mmhg RVSP 48 mmHg, RVEDP 6 mmhg PAP 42/9 mmhg, Mean Pap 20 mmhg CO 6.8 L/min, CI 3.3 L/min/m2 No pulmonary hypertension. Normal cardiac output  Vascular Ultrasound Lower Extremity Venous 06/09/2019: Right: There is no evidence of deep vein thrombosis in the lower extremity. However, portions of this examination were limited- see technologist comments above. No cystic structure found in  the popliteal fossa.  Left: There is no  evidence of deep vein thrombosis in the lower extremity. However, portions of this examination were limited- see technologist comments above. No cystic structure found in the popliteal fossa.   Scheduled  In office pacemaker check 09/29/19: Single (S)/Dual (D)/BV: D Presenting ASVP Pacemaker dependant:  Yes.  ASVP @35 /min. Longevity 6 Years. Magnet rate: >85%. AP 5%, VP 99%. BP NA AMS Episodes 4.  AT/AF burden <1% . Longest 5 min. EGM  AT. Latest 07/16/19. HVR 0,  Lead measurements: Stable. Thoracic impedance: NA Histogram: Low (L)/normal (N)/high (H)  Normal. Patient activity Low.  Observations: Normal device function. Changes: None  Scheduled Remote pacemaker check 03/09/2020:  There was a 0 % cumulative atrial arrhythmia burden. Battery longevity is 6.2 years. RA pacing is 3.7 %, RV pacing is 99.7 %.  Echocardiogram 04/17/2020:  Hyperdynamic LV systolic function with visual EF >70%. Intraventricular PG and mild LVOT obstruction noted with a peak PG of 37 mm Hg. Left  ventricle cavity is normal in size. Moderate concentric remodeling of the left ventricle. Normal global wall motion.  Doppler evidence of grade I (impaired) diastolic dysfunction, normal LAP. Frequent PVC or paced rhythm noted.  Right ventricle cavity is normal in size. Normal right ventricular function. Pacemaker lead/ICD lead noted in the RV.  Trileaflet aortic valve. No evidence of aortic stenosis. Trace aortic regurgitation.  Mild aortic valve leaflet thickening. Aortic valve peak  pressure gradient of 37 and mean gradient of 20.6 mmHg, calculated aortic valve area 1.5 cm.  Howevere the PG is related to intraventricular LV pressure gradient. AVA (VTI) measures 1.5 cm^2.  AV Mean Grad measures 20.6 mmHg. AV Pk Vel measures 3.04 m/s.  Mild calcification of the mitral valve annulus. Mild mitral valve leaflet calcification. Mildly restricted mitral valve leaflets.  No evidence of mitral stenosis. Mild (Grade I) mitral  regurgitation.  Structurally normal tricuspid valve. Mild tricuspid regurgitation. No evidence of pulmonary hypertension.  No evidence of significant pericardial effusion. Anterior fat pad noted.  EKG  03/31/2020: Sinus rhythm and first-degree AV block at the rate of 97 bpm, ventricularly paced rhythm, suspect His bundle pacing.  No further analysis.  No significant change from 02/03/2020.   Assessment     ICD-10-CM   1. Dyspnea on exertion  R06.00 Ambulatory referral to Pulmonology    Brain natriuretic peptide  2. CKD (chronic kidney disease), stage IV (HCC)  N18.4   3. Chronic diastolic heart failure (HCC)  I50.32 Brain natriuretic peptide     Recommendations:   Erika Fry is a 74 y.o. Caucasian female with hypertension, CKD now on HD, type 2 DM, hyperlipidemia, s/p Medtronic dual chamber pacemaker for symptomatic high grade AV block, hypothyroidism, anemia, osteopenia, bilateral chronic lower extremity venous insufficiency.   Patient was diagnosed with COVID-19 pneumonia on 12/25/2019, she continues to have occasional episodes of wheezing and this is related to post COVID-19 pneumonia and airway irritation.   I repeated the echocardiogram, she does have hyperdynamic LV, unfortunately I will not be able to use any negative chronotropic agents as she has normal blood pressure or low blood pressure in fact takes midodrine on the days of dialysis to keep her blood pressure up.  She has not been any clinically acute decompensated heart failure, she may have a component of chronic diabetic heart failure.  During the dialysis days I will obtain a BNP to establish her baseline.  She would be benefited from pulmonary evaluation for worsening dyspnea on  exertion.  Otherwise stable from cardiac standpoint, pacemaker is functioning normally, I will see her back in 6 months.  Adrian Prows, MD, [facility] 05/09/2020, 11:57 AM [facility] [place] Pager: [phone] Office: [phone]

## 2020-05-08 ENCOUNTER — Other Ambulatory Visit: Payer: Self-pay

## 2020-05-08 ENCOUNTER — Encounter: Payer: Self-pay | Admitting: Cardiology

## 2020-05-08 ENCOUNTER — Ambulatory Visit: Payer: Medicare Other | Admitting: Cardiology

## 2020-05-08 VITALS — BP 101/60 | HR 91 | Resp 16 | Ht 65.0 in | Wt 181.0 lb

## 2020-05-08 DIAGNOSIS — N184 Chronic kidney disease, stage 4 (severe): Secondary | ICD-10-CM

## 2020-05-08 DIAGNOSIS — R0609 Other forms of dyspnea: Secondary | ICD-10-CM

## 2020-05-08 DIAGNOSIS — R0600 Dyspnea, unspecified: Secondary | ICD-10-CM

## 2020-05-08 DIAGNOSIS — I5032 Chronic diastolic (congestive) heart failure: Secondary | ICD-10-CM

## 2020-05-18 DIAGNOSIS — R110 Nausea: Secondary | ICD-10-CM | POA: Insufficient documentation

## 2020-06-01 ENCOUNTER — Encounter: Payer: Self-pay | Admitting: Neurology

## 2020-06-07 ENCOUNTER — Encounter: Payer: Self-pay | Admitting: Critical Care Medicine

## 2020-07-10 ENCOUNTER — Institutional Professional Consult (permissible substitution): Payer: Medicare Other | Admitting: Internal Medicine

## 2020-07-12 ENCOUNTER — Other Ambulatory Visit: Payer: Self-pay | Admitting: Cardiology

## 2020-07-12 DIAGNOSIS — R0600 Dyspnea, unspecified: Secondary | ICD-10-CM

## 2020-07-12 DIAGNOSIS — R0609 Other forms of dyspnea: Secondary | ICD-10-CM

## 2020-07-12 DIAGNOSIS — K219 Gastro-esophageal reflux disease without esophagitis: Secondary | ICD-10-CM

## 2020-07-17 ENCOUNTER — Other Ambulatory Visit: Payer: Self-pay

## 2020-07-17 ENCOUNTER — Ambulatory Visit (INDEPENDENT_AMBULATORY_CARE_PROVIDER_SITE_OTHER): Payer: Medicare Other | Admitting: Critical Care Medicine

## 2020-07-17 ENCOUNTER — Encounter: Payer: Self-pay | Admitting: Critical Care Medicine

## 2020-07-17 VITALS — BP 122/76 | HR 88 | Temp 97.0°F | Ht 65.0 in | Wt 196.0 lb

## 2020-07-17 DIAGNOSIS — D649 Anemia, unspecified: Secondary | ICD-10-CM | POA: Diagnosis not present

## 2020-07-17 DIAGNOSIS — R0600 Dyspnea, unspecified: Secondary | ICD-10-CM

## 2020-07-17 DIAGNOSIS — Z8616 Personal history of COVID-19: Secondary | ICD-10-CM

## 2020-07-17 DIAGNOSIS — R0609 Other forms of dyspnea: Secondary | ICD-10-CM

## 2020-07-17 NOTE — Patient Instructions (Addendum)
Thank you for visiting Dr. Carlis Abbott at [facility]. We recommend the following: Orders Placed This Encounter  Procedures  . CT Chest High Resolution  . Pulmonary function test   Orders Placed This Encounter  Procedures  . CT Chest High Resolution    First available Sherry/DC [facility] MCR/[facility] No labs rqd     Standing Status:   Future    Standing Expiration Date:   07/17/2021    Order Specific Question:   Preferred imaging location?    Answer:   [address]    Order Specific Question:   Radiology Contrast Protocol - do NOT remove file path    Answer:   [email or web address]  . Pulmonary function test    Standing Status:   Future    Standing Expiration Date:   07/17/2021    Order Specific Question:   Where should this test be performed?    Answer:   [facility]    Order Specific Question:   Full PFT: includes the following: basic spirometry, spirometry pre & post bronchodilator, diffusion capacity (DLCO), lung volumes    Answer:   Full PFT     Return in about 2 months (around 09/16/2020). after PFTs    Please do your part to reduce the spread of COVID-19.

## 2020-07-17 NOTE — Progress Notes (Signed)
Synopsis: Referred in August 2021 for DOE by Adrian Prows, MD.  Subjective:   PATIENT ID: Erika Fry GENDER: female DOB: 09/26/1946, MRN: [id number]  Chief Complaint  Patient presents with  . Consult    reports shortness of breath, weakness since COVID diagnosis JAN 2021. Not vaccinated.    Ms. Dicola is a 74 y/o woman who presents for evaluation of dyspnea on exertion.  She is accompanied today by her daughter-in-law Museum/gallery conservator.  She was referred by her cardiologist for evaluation after having a stable echocardiogram this spring.  She has a permanent pacemaker for high grade AV block and chronic HFpEF.  Her dyspnea on exertion has been ongoing since around February 2021, several weeks after she had Covid in January.  She was hospitalized from 1/23-1/27 to receive remdesivir and steroids.  She is not discharged on oxygen.  She felt great at discharge.  But a month later she began having dyspnea on exertion that has worsened over time.  She denies coughing, sputum production, wheezing, or orthopnea.  She has tried her rescue inhaler which did not help her symptoms.  She uses walker at home.  She feels weak, hot, sweaty due to her shortness of breath.  She feels like she has to sit down or lay down every few minutes.  Her best day for symptoms is Monday where she has not had dialysis in 2 days.  She goes to dialysis on Tuesday, Thursday, Saturday and has to sleep for the rest of the day after her sessions.  She feels tired even the day after.  She denies change from her baseline lower extremity edema, which she manages with TED hose.  She has never smoked or vape.  Per her report, her hemoglobin is around 10 and has been stable after being low previously.  She has received blood transfusions in the remote past.  She gets iron infusions with dialysis.  She has not yet been vaccinated for Covid.    Past Medical History:  Diagnosis Date  . Arthritis    "knees, thumbs" (03/25/2018)  . AV block,  [place] 2 03/25/2018  . CKD (chronic kidney disease), stage IV ([place])   . Complication of anesthesia    Reports had hard time waking up in the past  . Diet-controlled diabetes mellitus ([place])   . Encounter for care of pacemaker 09/07/2019  . End stage renal failure on dialysis ([place])    T, Th, Sat [employer]  . GERD (gastroesophageal reflux disease)   . Gout    "on daily RX" (03/25/2018)  . Heart murmur   . High cholesterol   . Hypothyroidism   . Iron deficiency anemia    "had to get an iron infusion"  . Migraine    "used to have them growing up" (03/25/2018)  . Presence of permanent cardiac pacemaker 03/25/2018     Family History  Problem Relation Age of Onset  . Hypertension Mother   . Diabetes Mellitus II Father   . Heart disease Father   . Heart attack Father   . Gastric cancer Brother   . Diabetes Mellitus II Brother   . Stomach cancer Brother      Past Surgical History:  Procedure Laterality Date  . APPENDECTOMY    . AV FISTULA PLACEMENT Right 06/29/2019   Procedure: ARTERIOVENOUS (AV) FISTULA CREATION RIGHT UPPER  ARM;  Surgeon: Waynetta Sandy, MD;  Location: [place];  Service: Vascular;  Laterality: Right;  . [place] Right 09/08/2019  Procedure: BASILIC VEIN TRANSPOSITION RIGHT SECOND STAGE;  Surgeon: Waynetta Sandy, MD;  Location: [place];  Service: Vascular;  Laterality: Right;  . BIOPSY  05/19/2018   Procedure: BIOPSY;  Surgeon: Ladene Artist, MD;  Location: [place];  Service: Endoscopy;;  . Consuela Mimes W/ RETROGRADES Bilateral 01/14/2019   Procedure: CYSTOSCOPY WITH RETROGRADE PYELOGRAM BILATERAL HYDRODISTENTION;  Surgeon: Ardis Hughs, MD;  Location: [facility];  Service: Urology;  Laterality: Bilateral;  . ESOPHAGOGASTRODUODENOSCOPY N/A 05/19/2018   Procedure: ESOPHAGOGASTRODUODENOSCOPY (EGD);  Surgeon: Ladene Artist, MD;  Location: [facility] ENDOSCOPY;  Service: Endoscopy;  Laterality: N/A;  . ESOPHAGOGASTRODUODENOSCOPY (EGD)  WITH PROPOFOL N/A 01/28/2019   Procedure: ESOPHAGOGASTRODUODENOSCOPY (EGD) WITH PROPOFOL;  Surgeon: Rush Landmark Telford Nab., MD;  Location: [place];  Service: Gastroenterology;  Laterality: N/A;  . INSERT / REPLACE / REMOVE PACEMAKER  03/25/2018  . IR FLUORO GUIDE CV LINE RIGHT  05/18/2018  . IR FLUORO GUIDE CV LINE RIGHT  06/24/2019  . IR LUMBAR DISC ASPIRATION W/IMG GUIDE  05/15/2018  . IR NEPHROSTOMY EXCHANGE RIGHT  05/28/2019  . IR NEPHROSTOMY EXCHANGE RIGHT  07/19/2019  . IR NEPHROSTOMY PLACEMENT RIGHT  04/09/2019  . IR REMOVAL TUN CV CATH W/O FL  07/23/2018  . IR US GUIDE VASC ACCESS RIGHT  05/18/2018  . IR US GUIDE VASC ACCESS RIGHT  06/24/2019  . KNEE ARTHROSCOPY Bilateral   . PACEMAKER IMPLANT N/A 03/25/2018   Procedure: PACEMAKER IMPLANT;  Surgeon: Evans Lance, MD;  Location: [place] [facility];  Service: Cardiovascular;  Laterality: N/A;  . PACEMAKER PLACEMENT Left 03/2018  . POLYPECTOMY  01/28/2019   Procedure: POLYPECTOMY;  Surgeon: Mansouraty, Telford Nab., MD;  Location: [place];  Service: Gastroenterology;;  . RIGHT HEART CATH N/A 03/20/2018   Procedure: RIGHT HEART CATH;  Surgeon: Nigel Mormon, MD;  Location: [place] [facility];  Service: Cardiovascular;  Laterality: N/A;  . TEE WITHOUT CARDIOVERSION N/A 03/20/2018   Procedure: TRANSESOPHAGEAL ECHOCARDIOGRAM (TEE);  Surgeon: Nigel Mormon, MD;  Location: [facility] ENDOSCOPY;  Service: Cardiovascular;  Laterality: N/A;  . TONSILLECTOMY    . URETEROSCOPY WITH HOLMIUM LASER LITHOTRIPSY Right 01/14/2019   Procedure: URETEROSCOPY WITH HOLMIUM LASER LITHOTRIPSY BLADDER BIOPSIES RIGHT STENT PLACEMENT;  Surgeon: Ardis Hughs, MD;  Location[facility];  Service: Urology;  Laterality: Right;    Social History   Socioeconomic History  . Marital status: Widowed    Spouse name: Not on file  . Number of children: 3  . Years of education: Not on file  . Highest education level: Not on file  Occupational History  . Not  on file  Tobacco Use  . Smoking status: Never Smoker  . Smokeless tobacco: Never Used  Vaping Use  . Vaping Use: Never used  Substance and Sexual Activity  . Alcohol use: Not Currently  . Drug use: Never  . Sexual activity: Not Currently  Other Topics Concern  . Not on file  Social History Narrative  . Not on file   Social Determinants of Health   Financial Resource Strain:   . Difficulty of Paying Living Expenses:   Food Insecurity:   . Worried About Charity fundraiser in the Last Year:   . Arboriculturist in the Last Year:   Transportation Needs:   . Film/video editor (Medical):   [name] Kitchen Lack of Transportation (Non-Medical):   Physical Activity:   . Days of Exercise per Week:   . Minutes of Exercise per Session:  Stress:   . Feeling of Stress :   Social Connections:   . Frequency of Communication with Friends and Family:   . Frequency of Social Gatherings with Friends and Family:   . Attends Religious Services:   . Active Member of Clubs or Organizations:   . Attends Archivist Meetings:   [name] Kitchen Marital Status:   Intimate Partner Violence:   . Fear of Current or Ex-Partner:   . Emotionally Abused:   [name] Kitchen Physically Abused:   . Sexually Abused:      Allergies  Allergen Reactions  . Baclofen Other (See Comments)    Somnolence- PUT THE PATIENT INTO A COMA AND "ALMOST KILLED HER"   . Tape Other (See Comments)    SKIN WILL TEAR AND BRUISE EASILY!!  . Other Other (See Comments)    Patient has a high tolerance to antibiotics- has taken a lot of them during the course of her life  . Codeine Other (See Comments)    Increased pain and couldn't sleep  . Dextromethorphan-Guaifenesin Other (See Comments)    Unknown reaction  . Latex Rash    Certain briefs for incontinence break her out     Immunization History  Administered Date(s) Administered  . Hepatitis B, adult 07/22/2019, 08/19/2019  . Influenza, High Dose Seasonal PF 08/26/2019  . Influenza,inj,Quad  PF,6+ Mos 08/17/2018  . Pneumococcal Polysaccharide-23 05/01/2018    Outpatient Medications Prior to Visit  Medication Sig Dispense Refill  . albuterol (PROAIR HFA) 108 (90 Base) MCG/ACT inhaler Inhale 2 puffs into the lungs See admin instructions. Inhale 2 puffs into the lungs every four to six hours as needed for wheezing 1 g 3  . Alpha-Lipoic Acid 150 MG CAPS Take by mouth.    [name] Kitchen ascorbic acid (VITAMIN C) 500 MG tablet Take 500 mg by mouth daily.    . B Complex-C-Zn-Folic Acid (DIALYVITE 409 WITH ZINC) 0.8 MG TABS Take 1 tablet by mouth daily.     . Biotin 10000 MCG TABS Take 10,000 mcg by mouth daily with breakfast.    . cetirizine (ZYRTEC) 10 MG tablet Take 10 mg by mouth at bedtime.     . Cholecalciferol (VITAMIN D3) 50 MCG (2000 UT) TABS Take 2,000 mg by mouth daily with breakfast.    . ferric citrate (AURYXIA) 1 GM 210 MG(Fe) tablet Take 420 mg by mouth 3 (three) times daily with meals.     . flavoxATE (URISPAS) 100 MG tablet Take 1 tablet (100 mg total) by mouth 3 (three) times daily as needed (bladder spasms, dysuria). (Patient taking differently: Take 100 mg by mouth 3 (three) times daily. ) 30 tablet 0  . fluticasone (FLONASE) 50 MCG/ACT nasal spray Place 2 sprays into both nostrils daily as needed for allergies or rhinitis.    [name] Kitchen gabapentin (NEURONTIN) 100 MG capsule Take 1 capsule (100 mg total) by mouth 3 (three) times daily. (Patient taking differently: Take 100 mg by mouth at bedtime. ) 90 capsule 1  . HYDROcodone-acetaminophen (NORCO/VICODIN) 5-325 MG tablet Take 1 tablet by mouth 6 (six) times daily.    [name] Kitchen levothyroxine (SYNTHROID) 75 MCG tablet Take 1 tablet (75 mcg total) by mouth daily before breakfast. 30 tablet 1  . lidocaine-prilocaine (EMLA) cream Apply 1 application topically See admin instructions. APPLY SMALL AMOUNT TO ACCESS SITE (AVF) 1 TO 2 HOURS BEFORE DIALYSIS. COVER WITH OCCLUSIVE DRESSING (SARAN WRAP)    . midodrine (PROAMATINE) 10 MG tablet Take 10 mg by mouth  See admin instructions. Take  10 mg by mouth 30 minutes before dialysis only on Tues/Thurs/Sat    . omeprazole (PRILOSEC) 20 MG capsule TAKE 1 CAPSULE BY MOUTH ONCE DAILY BEFORE BREAKFAST 30 capsule 6  . polyethylene glycol (MIRALAX / GLYCOLAX) 17 g packet Take 17 g by mouth 2 (two) times daily. 60 each 0  . senna (SENOKOT) 8.6 MG TABS tablet Take 1 tablet by mouth at bedtime.    . simvastatin (ZOCOR) 40 MG tablet Take 1 tablet (40 mg total) by mouth at bedtime.    [name] Kitchen zinc gluconate 50 MG tablet Take 50 mg by mouth daily.     No facility-administered medications prior to visit.    Review of Systems  Constitutional: Negative for chills and fever.  Cardiovascular: Negative for chest pain.       Chronic LE edema unchanged     Objective:   Vitals:   07/17/20 0859  BP: 122/76  Pulse: 88  Temp: (!) 97 F (36.1 C)  SpO2: 96%  Weight: 196 lb (88.9 kg)  Height: 5\' 5"  (1.651 m)   96% on  RA BMI Readings from Last 3 Encounters:  07/17/20 32.62 kg/m  05/08/20 30.12 kg/m  03/31/20 30.62 kg/m   Wt Readings from Last 3 Encounters:  07/17/20 196 lb (88.9 kg)  05/08/20 181 lb (82.1 kg)  03/31/20 184 lb (83.5 kg)    Physical Exam Vitals reviewed.  Constitutional:      General: She is not in acute distress.    Appearance: She is obese. She is not ill-appearing.  HENT:     Head: Normocephalic and atraumatic.     Nose:     Comments: Deferred due to masking requirement.    Mouth/Throat:     Comments: Deferred due to masking requirement. Eyes:     General: No scleral icterus. Cardiovascular:     Rate and Rhythm: Normal rate and regular rhythm.     Comments: RSB systolic murmur 2/6 Pulmonary:     Comments: CTAB, breathing comfortably on RA. No conversational dyspnea. Abdominal:     General: There is no distension.     Palpations: Abdomen is soft.  Musculoskeletal:        General: Swelling present. No deformity.     Cervical back: Neck supple.  Skin:    General: Skin is warm  and dry.     Findings: No rash.     Comments: Leg wounds not examined  Neurological:     General: No focal deficit present.     Mental Status: She is alert.     Coordination: Coordination normal.  Psychiatric:        Mood and Affect: Mood normal.        Behavior: Behavior normal.      CBC    Component Value Date/Time   WBC 7.6 02/02/2020 2138   RBC 3.42 (L) 02/02/2020 2138   HGB 10.0 (L) 02/02/2020 2138   HCT 32.1 (L) 02/02/2020 2138   HCT 20.1 (L) 05/13/2018 0427   PLT 167 02/02/2020 2138   MCV 93.9 02/02/2020 2138   MCH 29.2 02/02/2020 2138   MCHC 31.2 02/02/2020 2138   RDW 18.1 (H) 02/02/2020 2138   LYMPHSABS 0.6 (L) 12/29/2019 0332   MONOABS 0.5 12/29/2019 0332   EOSABS 0.0 12/29/2019 0332   BASOSABS 0.0 12/29/2019 0332    CHEMISTRY No results for input(s): NA, K, CL, CO2, GLUCOSE, BUN, CREATININE, CALCIUM, MG, PHOS in the last 168 hours. CrCl cannot be calculated (Patient's most recent lab  result is older than the maximum 21 days allowed.). Bicarb 28  Chest Imaging- films reviewed: CXR, 1 view 02/03/2020-healed right posterolateral rib fractures.  Increased interstitial markings bilaterally, no effusions.  Compared to previous films, January 2021 with peripheral patchy opacities and July 2020 with left dependent pleural effusion and increasing interstitial markings.  PPM in place.  Pulmonary Functions Testing Results: No flowsheet data found.   Echocardiogram 04/17/2020: LVEF greater than 70%, mild LVOT obstruction, normal LV size, moderate concentric LVH.  Grade 1 diastolic dysfunction.  Normal RV.  Mitral valve calcification with mildly restricted leaflets, no stenosis, grade 1 MR.      Assessment & Plan:     ICD-10-CM   1. Exertional dyspnea  R06.00 CT Chest High Resolution    Pulmonary function test  2. History of 2019 novel coronavirus disease (COVID-19)  Z86.16 CT Chest High Resolution    Pulmonary function test  3. Chronic anemia  D64.9     DOE  since Covid infection. Chronic anemia sounds stable per her report, so less likely contributing to her progressive symptoms. HD-related fatigue seems to relate, but no evidence of hypervolemia on exam today. -PFTs -HRCT chest -Okay to discontinue use of rescue inhaler given lack of benefit. -She has been requested to bring her outpatient dialysis lab report to her next visit. -Social distance, continue mask wearing, handwashing.  It is possible to have repeat infection, which is salient to her given chronic immunosuppression related to ESRD. Needs Covid vaccine if not previously immunized- discussed getting this at an outpatient pharmacy ASAP. -Needs Prevnar 13 if not previously immunized. She will look into this. -Needs annual flu shot.  RTC in 2 months after PFTs.   Current Outpatient Medications:  .  albuterol (PROAIR HFA) 108 (90 Base) MCG/ACT inhaler, Inhale 2 puffs into the lungs See admin instructions. Inhale 2 puffs into the lungs every four to six hours as needed for wheezing, Disp: 1 g, Rfl: 3 .  Alpha-Lipoic Acid 150 MG CAPS, Take by mouth., Disp: , Rfl:  .  ascorbic acid (VITAMIN C) 500 MG tablet, Take 500 mg by mouth daily., Disp: , Rfl:  .  B Complex-C-Zn-Folic Acid (DIALYVITE 308 WITH ZINC) 0.8 MG TABS, Take 1 tablet by mouth daily. , Disp: , Rfl:  .  Biotin 10000 MCG TABS, Take 10,000 mcg by mouth daily with breakfast., Disp: , Rfl:  .  cetirizine (ZYRTEC) 10 MG tablet, Take 10 mg by mouth at bedtime. , Disp: , Rfl:  .  Cholecalciferol (VITAMIN D3) 50 MCG (2000 UT) TABS, Take 2,000 mg by mouth daily with breakfast., Disp: , Rfl:  .  ferric citrate (AURYXIA) 1 GM 210 MG(Fe) tablet, Take 420 mg by mouth 3 (three) times daily with meals. , Disp: , Rfl:  .  flavoxATE (URISPAS) 100 MG tablet, Take 1 tablet (100 mg total) by mouth 3 (three) times daily as needed (bladder spasms, dysuria). (Patient taking differently: Take 100 mg by mouth 3 (three) times daily. ), Disp: 30 tablet, Rfl:  0 .  fluticasone (FLONASE) 50 MCG/ACT nasal spray, Place 2 sprays into both nostrils daily as needed for allergies or rhinitis., Disp: , Rfl:  .  gabapentin (NEURONTIN) 100 MG capsule, Take 1 capsule (100 mg total) by mouth 3 (three) times daily. (Patient taking differently: Take 100 mg by mouth at bedtime. ), Disp: 90 capsule, Rfl: 1 .  HYDROcodone-acetaminophen (NORCO/VICODIN) 5-325 MG tablet, Take 1 tablet by mouth 6 (six) times daily., Disp: , Rfl:  .  levothyroxine (SYNTHROID) 75 MCG tablet, Take 1 tablet (75 mcg total) by mouth daily before breakfast., Disp: 30 tablet, Rfl: 1 .  lidocaine-prilocaine (EMLA) cream, Apply 1 application topically See admin instructions. APPLY SMALL AMOUNT TO ACCESS SITE (AVF) 1 TO 2 HOURS BEFORE DIALYSIS. COVER WITH OCCLUSIVE DRESSING (SARAN WRAP), Disp: , Rfl:  .  midodrine (PROAMATINE) 10 MG tablet, Take 10 mg by mouth See admin instructions. Take 10 mg by mouth 30 minutes before dialysis only on Tues/Thurs/Sat, Disp: , Rfl:  .  omeprazole (PRILOSEC) 20 MG capsule, TAKE 1 CAPSULE BY MOUTH ONCE DAILY BEFORE BREAKFAST, Disp: 30 capsule, Rfl: 6 .  polyethylene glycol (MIRALAX / GLYCOLAX) 17 g packet, Take 17 g by mouth 2 (two) times daily., Disp: 60 each, Rfl: 0 .  senna (SENOKOT) 8.6 MG TABS tablet, Take 1 tablet by mouth at bedtime., Disp: , Rfl:  .  simvastatin (ZOCOR) 40 MG tablet, Take 1 tablet (40 mg total) by mouth at bedtime., Disp: , Rfl:  .  zinc gluconate 50 MG tablet, Take 50 mg by mouth daily., Disp: , Rfl:     Julian Hy, DO [facility] 07/17/2020 9:46 AM

## 2020-07-20 ENCOUNTER — Other Ambulatory Visit: Payer: Medicare Other

## 2020-07-21 ENCOUNTER — Other Ambulatory Visit: Payer: Medicare Other

## 2020-07-24 ENCOUNTER — Other Ambulatory Visit: Payer: Self-pay

## 2020-07-24 ENCOUNTER — Ambulatory Visit (INDEPENDENT_AMBULATORY_CARE_PROVIDER_SITE_OTHER)
Admission: RE | Admit: 2020-07-24 | Discharge: 2020-07-24 | Disposition: A | Discharge status: Home / Self Care | Payer: Medicare Other | Source: Ambulatory Visit | Attending: Critical Care Medicine | Admitting: Critical Care Medicine

## 2020-07-24 DIAGNOSIS — R0600 Dyspnea, unspecified: Secondary | ICD-10-CM | POA: Diagnosis not present

## 2020-07-24 DIAGNOSIS — R0609 Other forms of dyspnea: Secondary | ICD-10-CM

## 2020-07-24 DIAGNOSIS — Z8616 Personal history of COVID-19: Secondary | ICD-10-CM

## 2020-07-24 NOTE — Progress Notes (Signed)
Please let Erika Fry know that her CT scan does show some scarring in the bottom of her lungs.  We will review it at her next appointment.  At this point unless this is progression, there is no change in her treatment at this time.  We will monitor this with another follow-up up CT scan in 1 year. Thanks!

## 2020-07-31 ENCOUNTER — Telehealth: Payer: Self-pay | Admitting: Critical Care Medicine

## 2020-07-31 DIAGNOSIS — R1310 Dysphagia, unspecified: Secondary | ICD-10-CM

## 2020-07-31 NOTE — Progress Notes (Signed)
Spoke with the pt and notified of results

## 2020-07-31 NOTE — Telephone Encounter (Signed)
Julian Hy, DO sent to Clovis Fredrickson, CMA Please let Ms. Rottman know that her CT scan does show some scarring in the bottom of her lungs. We will review it at her next appointment. At this point unless this is progression, there is no change in her treatment at this time. We will monitor this with another follow-up up CT scan in 1 year. Thanks!    Spoke with pt and notified of results per Dr. Carlis Abbott. Pt verbalized understanding and denied any questions.  Pt wanted me to let Dr Carlis Abbott know that she forgot to tell her that she forgot to tell her at ov that she has trouble swallowing She states this has been going on for a while  She has to be careful with chewing, and gets choked on food often  She did not know if Dr Carlis Abbott wanted to order any additional testing regarding this prior to next ov- appt due in Oct and recall in  She has never seen GI and takes omeprazole 20 mg daily  Please advise thanks!

## 2020-08-01 ENCOUNTER — Other Ambulatory Visit: Payer: Self-pay | Admitting: Critical Care Medicine

## 2020-08-01 ENCOUNTER — Other Ambulatory Visit (HOSPITAL_COMMUNITY): Payer: Self-pay

## 2020-08-01 ENCOUNTER — Other Ambulatory Visit: Payer: Self-pay

## 2020-08-01 DIAGNOSIS — Z8616 Personal history of COVID-19: Secondary | ICD-10-CM

## 2020-08-01 DIAGNOSIS — R0609 Other forms of dyspnea: Secondary | ICD-10-CM

## 2020-08-01 DIAGNOSIS — R0600 Dyspnea, unspecified: Secondary | ICD-10-CM

## 2020-08-01 DIAGNOSIS — R1310 Dysphagia, unspecified: Secondary | ICD-10-CM

## 2020-08-01 NOTE — Telephone Encounter (Signed)
Yes, that is important to know. Please order an outpatient SLP evaluation and a barium esophagram.  Julian Hy, DO 08/01/20 7:04 AM [facility]

## 2020-08-01 NOTE — Telephone Encounter (Signed)
Spoke with the pt and notified of response per Dr Carlis Abbott. She was okay with ordering these tests and I have sent orders to [facility].

## 2020-08-15 ENCOUNTER — Other Ambulatory Visit: Payer: Self-pay | Admitting: Internal Medicine

## 2020-08-15 DIAGNOSIS — R1011 Right upper quadrant pain: Secondary | ICD-10-CM

## 2020-08-15 DIAGNOSIS — R109 Unspecified abdominal pain: Secondary | ICD-10-CM

## 2020-08-16 ENCOUNTER — Ambulatory Visit (HOSPITAL_COMMUNITY)
Admission: RE | Admit: 2020-08-16 | Discharge: 2020-08-16 | Disposition: A | Discharge status: Home / Self Care | Payer: Medicare Other | Source: Ambulatory Visit | Attending: Critical Care Medicine | Admitting: Critical Care Medicine

## 2020-08-16 ENCOUNTER — Ambulatory Visit (HOSPITAL_COMMUNITY)
Admission: RE | Admit: 2020-08-16 | Discharge: 2020-08-16 | Disposition: A | Discharge status: Home / Self Care | Payer: Medicare Other | Source: Ambulatory Visit | Attending: Internal Medicine | Admitting: Internal Medicine

## 2020-08-16 ENCOUNTER — Other Ambulatory Visit: Payer: Self-pay

## 2020-08-16 DIAGNOSIS — R1310 Dysphagia, unspecified: Secondary | ICD-10-CM

## 2020-08-16 DIAGNOSIS — R0600 Dyspnea, unspecified: Secondary | ICD-10-CM

## 2020-08-16 DIAGNOSIS — Z8616 Personal history of COVID-19: Secondary | ICD-10-CM | POA: Insufficient documentation

## 2020-08-16 DIAGNOSIS — R0609 Other forms of dyspnea: Secondary | ICD-10-CM

## 2020-08-16 NOTE — Progress Notes (Signed)
Modified Barium Swallow Progress Note  Patient Details  Name: Erika Fry MRN: [id number] Date of Birth: [birth year]  Today's Date: 08/16/2020  Modified Barium Swallow completed.  Full report located under Chart Review in the Imaging Section.  Brief recommendations include the following:  Clinical Impression  Pt's oropharyngeal swallow integrity was within normal limits for oral manipulation, mastication and transit. For her pharyngeal phase all aspects of laryngeal protection were timely, coordinated and strong. No aspiration observed and cervical esophageal component was adequate. Able to transit barium pill with 2 attempts with thin barium which cleared esophagus. Symptoms likely related to her GER and advised to use caution with meat, breads and dry, particulate foods and general reflux precautions. Pt having barium esophagram immediately following MBS.       Swallow Evaluation Recommendations       SLP Diet Recommendations: Regular solids;Thin liquid;Other (Comment) (see impression)   Liquid Administration via: Cup;Straw   Medication Administration: Whole meds with liquid   Supervision: Patient able to self feed   Compensations: Slow rate;Small sips/bites   Postural Changes: Seated upright at 90 degrees;Remain semi-upright after after feeds/meals (Comment)   Oral Care Recommendations: Oral care BID        Houston Siren 08/16/2020,4:02 PM  [name] Colvin Caroli.Ed Risk analyst [phone] Office [phone]

## 2020-08-16 NOTE — Progress Notes (Signed)
Please let Erika Fry know that her swallow study does not show that she needs additional intervention. She is a low aspiration risk. Thanks!  Her esophagram showed a normal esophagus that has some abnormal movement to push food through it, which may be related to acid reflux.

## 2020-08-18 ENCOUNTER — Ambulatory Visit
Admission: RE | Admit: 2020-08-18 | Discharge: 2020-08-18 | Disposition: A | Discharge status: Home / Self Care | Payer: Medicare Other | Source: Ambulatory Visit | Attending: Internal Medicine | Admitting: Internal Medicine

## 2020-08-18 ENCOUNTER — Other Ambulatory Visit: Payer: Medicare Other

## 2020-08-18 DIAGNOSIS — R1011 Right upper quadrant pain: Secondary | ICD-10-CM

## 2020-08-18 DIAGNOSIS — R109 Unspecified abdominal pain: Secondary | ICD-10-CM

## 2020-08-18 NOTE — Progress Notes (Signed)
Called and left message on voicemail to please return call to go over results. Contact number provided.

## 2020-08-21 DIAGNOSIS — T7840XA Allergy, unspecified, initial encounter: Secondary | ICD-10-CM | POA: Insufficient documentation

## 2020-08-21 DIAGNOSIS — T782XXA Anaphylactic shock, unspecified, initial encounter: Secondary | ICD-10-CM | POA: Insufficient documentation

## 2020-08-25 ENCOUNTER — Ambulatory Visit: Payer: Medicare Other

## 2020-08-31 ENCOUNTER — Encounter: Payer: Self-pay | Admitting: Cardiology

## 2020-09-04 ENCOUNTER — Other Ambulatory Visit: Payer: Self-pay

## 2020-09-04 ENCOUNTER — Encounter: Payer: Self-pay | Admitting: Neurology

## 2020-09-04 ENCOUNTER — Ambulatory Visit (INDEPENDENT_AMBULATORY_CARE_PROVIDER_SITE_OTHER): Payer: Medicare Other | Admitting: Neurology

## 2020-09-04 VITALS — BP 96/63 | HR 87 | Ht 65.0 in | Wt 200.0 lb

## 2020-09-04 DIAGNOSIS — Z992 Dependence on renal dialysis: Secondary | ICD-10-CM

## 2020-09-04 DIAGNOSIS — G63 Polyneuropathy in diseases classified elsewhere: Secondary | ICD-10-CM | POA: Diagnosis not present

## 2020-09-04 DIAGNOSIS — N186 End stage renal disease: Secondary | ICD-10-CM

## 2020-09-04 NOTE — Patient Instructions (Signed)
Check feet daily  Always use a cane when walking  Consider getting hand controls for your car

## 2020-09-04 NOTE — Progress Notes (Signed)
[facility] Clinic Note - Initial Visit   Date: 09/04/20  Erika Fry MRN: [id number] DOB: [birth year]   Dear Dr. Moshe Cipro:  Thank you for your kind referral of Erika Fry for consultation of neuropathy. Although her history is well known to you, please allow [place] to reiterate it for the purpose of our medical record. The patient was accompanied to the clinic by self.    History of Present Illness: Erika Fry is a 74 y.o. right-handed female with ESRD on HD, diabetes mellitus, interstitial cystitis, and hypothyroidism presenting for evaluation of neuropathy.  She has numbness/tingling in the feet for the past year. Symptoms are constant and alleviated by nothing.  She has noticed that numbness is worse after dialysis.  Because of the severity of numbness, she stopped driving several months ago due to inability to safely manipulate the pedals. She has some tinging and rare spells of electrical pain.  She takes gabapentin 100mg  at nighttime, but does not appreciate much change in numbness.  When she tried gabapentin 300mg  at bedtime, she developed insomnia.   She also complains of bilateral 4th and 5th finger numbness/tingling, worse on the right.  This has been present for a while  She has some weakness in the hands.    Out-side paper records, electronic medical record, and images have been reviewed where available and summarized as:  Lab Results  Component Value Date   HGBA1C 6.3 (H) 04/30/2018   Lab Results  Component Value Date   VITAMINB12 2,132 (H) 01/25/2019   Lab Results  Component Value Date   TSH 0.706 05/29/2018   Lab Results  Component Value Date   ESRSEDRATE 56 (H) 06/09/2019    Past Medical History:  Diagnosis Date  . Arthritis    "knees, thumbs" (03/25/2018)  . AV block, Mobitz 2 03/25/2018  . CKD (chronic kidney disease), stage IV ([place])   . Complication of anesthesia    Reports had hard time waking up in the  past  . Diet-controlled diabetes mellitus ([place])   . Encounter for care of pacemaker 09/07/2019  . End stage renal failure on dialysis ([place])    T, Th, Sat [employer]  . GERD (gastroesophageal reflux disease)   . Gout    "on daily RX" (03/25/2018)  . Heart murmur   . High cholesterol   . Hypothyroidism   . Iron deficiency anemia    "had to get an iron infusion"  . Migraine    "used to have them growing up" (03/25/2018)  . Presence of permanent cardiac pacemaker 03/25/2018    Past Surgical History:  Procedure Laterality Date  . APPENDECTOMY    . AV FISTULA PLACEMENT Right 06/29/2019   Procedure: ARTERIOVENOUS (AV) FISTULA CREATION RIGHT UPPER  ARM;  Surgeon: Waynetta Sandy, MD;  Location: [place];  Service: Vascular;  Laterality: Right;  . BASCILIC VEIN TRANSPOSITION Right 09/08/2019   Procedure: BASILIC VEIN TRANSPOSITION RIGHT SECOND STAGE;  Surgeon: Waynetta Sandy, MD;  Location: [place];  Service: Vascular;  Laterality: Right;  . BIOPSY  05/19/2018   Procedure: BIOPSY;  Surgeon: Ladene Artist, MD;  Location: [place];  Service: Endoscopy;;  . Consuela Mimes W/ RETROGRADES Bilateral 01/14/2019   Procedure: CYSTOSCOPY WITH RETROGRADE PYELOGRAM BILATERAL HYDRODISTENTION;  Surgeon: Ardis Hughs, MD;  Location: [facility];  Service: Urology;  Laterality: Bilateral;  . ESOPHAGOGASTRODUODENOSCOPY N/A 05/19/2018   Procedure: ESOPHAGOGASTRODUODENOSCOPY (EGD);  Surgeon: Ladene Artist, MD;  Location: [place];  Service:  Endoscopy;  Laterality: N/A;  . ESOPHAGOGASTRODUODENOSCOPY (EGD) WITH PROPOFOL N/A 01/28/2019   Procedure: ESOPHAGOGASTRODUODENOSCOPY (EGD) WITH PROPOFOL;  Surgeon: Rush Landmark Telford Nab., MD;  Location: [place];  Service: Gastroenterology;  Laterality: N/A;  . INSERT / REPLACE / REMOVE PACEMAKER  03/25/2018  . IR FLUORO GUIDE CV LINE RIGHT  05/18/2018  . IR FLUORO GUIDE CV LINE RIGHT  06/24/2019  . IR LUMBAR DISC ASPIRATION W/IMG GUIDE  05/15/2018    . IR NEPHROSTOMY EXCHANGE RIGHT  05/28/2019  . IR NEPHROSTOMY EXCHANGE RIGHT  07/19/2019  . IR NEPHROSTOMY PLACEMENT RIGHT  04/09/2019  . IR REMOVAL TUN CV CATH W/O FL  07/23/2018  . IR US GUIDE VASC ACCESS RIGHT  05/18/2018  . IR US GUIDE VASC ACCESS RIGHT  06/24/2019  . KNEE ARTHROSCOPY Bilateral   . PACEMAKER IMPLANT N/A 03/25/2018   Procedure: PACEMAKER IMPLANT;  Surgeon: Evans Lance, MD;  Location: [place] [facility];  Service: Cardiovascular;  Laterality: N/A;  . PACEMAKER PLACEMENT Left 03/2018  . POLYPECTOMY  01/28/2019   Procedure: POLYPECTOMY;  Surgeon: Mansouraty, Telford Nab., MD;  Location: [place];  Service: Gastroenterology;;  . RIGHT HEART CATH N/A 03/20/2018   Procedure: RIGHT HEART CATH;  Surgeon: Nigel Mormon, MD;  Location: [place] [facility];  Service: Cardiovascular;  Laterality: N/A;  . TEE WITHOUT CARDIOVERSION N/A 03/20/2018   Procedure: TRANSESOPHAGEAL ECHOCARDIOGRAM (TEE);  Surgeon: Nigel Mormon, MD;  [facility];  Service: Cardiovascular;  Laterality: N/A;  . TONSILLECTOMY    . URETEROSCOPY WITH HOLMIUM LASER LITHOTRIPSY Right 01/14/2019   Procedure: URETEROSCOPY WITH HOLMIUM LASER LITHOTRIPSY BLADDER BIOPSIES RIGHT STENT PLACEMENT;  Surgeon: Ardis Hughs, MD;  Location: [facility];  Service: Urology;  Laterality: Right;     Medications:  Outpatient Encounter Medications as of 09/04/2020  Medication Sig  . albuterol (PROAIR HFA) 108 (90 Base) MCG/ACT inhaler Inhale 2 puffs into the lungs See admin instructions. Inhale 2 puffs into the lungs every four to six hours as needed for wheezing  . Alpha-Lipoic Acid 150 MG CAPS Take by mouth.  [name] Kitchen ascorbic acid (VITAMIN C) 500 MG tablet Take 500 mg by mouth daily.  . B Complex-C-Zn-Folic Acid (DIALYVITE 564 WITH ZINC) 0.8 MG TABS Take 1 tablet by mouth daily.   . Biotin 10000 MCG TABS Take 10,000 mcg by mouth daily with breakfast.  . cetirizine (ZYRTEC) 10 MG tablet Take 10 mg by mouth at  bedtime.   . Cholecalciferol (VITAMIN D3) 50 MCG (2000 UT) TABS Take 2,000 mg by mouth daily with breakfast.  . ferric citrate (AURYXIA) 1 GM 210 MG(Fe) tablet Take 420 mg by mouth 3 (three) times daily with meals.   . fluticasone (FLONASE) 50 MCG/ACT nasal spray Place 2 sprays into both nostrils daily as needed for allergies or rhinitis.  [name] Kitchen gabapentin (NEURONTIN) 100 MG capsule Take 1 capsule (100 mg total) by mouth 3 (three) times daily. (Patient taking differently: Take 100 mg by mouth at bedtime. )  . HYDROcodone-acetaminophen (NORCO/VICODIN) 5-325 MG tablet Take 1 tablet by mouth 6 (six) times daily.  [name] Kitchen levothyroxine (SYNTHROID) 75 MCG tablet Take 1 tablet (75 mcg total) by mouth daily before breakfast.  . lidocaine-prilocaine (EMLA) cream Apply 1 application topically See admin instructions. APPLY SMALL AMOUNT TO ACCESS SITE (AVF) 1 TO 2 HOURS BEFORE DIALYSIS. COVER WITH OCCLUSIVE DRESSING (SARAN WRAP)  . midodrine (PROAMATINE) 10 MG tablet Take 10 mg by mouth See admin instructions. Take 10 mg by mouth 30 minutes before  dialysis only on Tues/Thurs/Sat  . omeprazole (PRILOSEC) 20 MG capsule TAKE 1 CAPSULE BY MOUTH ONCE DAILY BEFORE BREAKFAST  . polyethylene glycol (MIRALAX / GLYCOLAX) 17 g packet Take 17 g by mouth 2 (two) times daily.  [name] Kitchen senna (SENOKOT) 8.6 MG TABS tablet Take 1 tablet by mouth at bedtime.  . simvastatin (ZOCOR) 40 MG tablet Take 1 tablet (40 mg total) by mouth at bedtime.  [name] Kitchen zinc gluconate 50 MG tablet Take 50 mg by mouth daily.  . flavoxATE (URISPAS) 100 MG tablet Take 1 tablet (100 mg total) by mouth 3 (three) times daily as needed (bladder spasms, dysuria). (Patient not taking: Reported on 09/04/2020)   No facility-administered encounter medications on file as of 09/04/2020.    Allergies:  Allergies  Allergen Reactions  . Baclofen Other (See Comments)    Somnolence- PUT THE PATIENT INTO A COMA AND "ALMOST KILLED HER"   . Tape Other (See Comments)    SKIN WILL TEAR  AND BRUISE EASILY!!  . Other Other (See Comments)    Patient has a high tolerance to antibiotics- has taken a lot of them during the course of her life  . Codeine Other (See Comments)    Increased pain and couldn't sleep  . Dextromethorphan-Guaifenesin Other (See Comments)    Unknown reaction  . Latex Rash    Certain briefs for incontinence break her out    Family History: Family History  Problem Relation Age of Onset  . Hypertension Mother   . Diabetes Mellitus II Father   . Heart disease Father   . Heart attack Father   . Gastric cancer Brother   . Diabetes Mellitus II Brother   . Stomach cancer Brother     Social History: Social History   Tobacco Use  . Smoking status: Never Smoker  . Smokeless tobacco: Never Used  Vaping Use  . Vaping Use: Never used  Substance Use Topics  . Alcohol use: Not Currently  . Drug use: Never   Social History   Social History Narrative   Right Handed   Lives in a one story home with basement   Drinks no caffeine     Vital Signs:  BP 96/63   Pulse 87   Ht 5\' 5"  (1.651 m)   Wt 200 lb (90.7 kg)   SpO2 97%   BMI 33.28 kg/m    Neurological Exam: MENTAL STATUS including orientation to time, place, person, recent and remote memory, attention span and concentration, language, and fund of knowledge is normal.  Speech is not dysarthric.  CRANIAL NERVES: II:  No visual field defects.   III-IV-VI: Pupils equal round and reactive.  Normal conjugate, extra-ocular eye movements in all directions of gaze.  No nystagmus.  No ptosis.   VII:  Normal facial symmetry and movements.   VIII:  Normal hearing and vestibular function.   IX-X:  Normal palatal movement.   XI:  Normal shoulder shrug and head rotation.   XII:  Normal tongue strength and range of motion, no deviation or fasciculation.  MOTOR:  No atrophy, fasciculations or abnormal movements.  No pronator drift.   Upper Extremity:  Right  Left  Deltoid  5/5   5/5   Biceps  5/5    5/5   Triceps  5/5   5/5   Infraspinatus 5/5  5/5  Medial pectoralis 5/5  5/5  Wrist extensors  5/5   5/5   Wrist flexors  5/5   5/5   Finger extensors  5/5   5/5   Finger flexors  5/5   5/5   Dorsal interossei  4/5   4/5   Abductor pollicis  5/5   5/5   Tone (Ashworth scale)  0  0   Lower Extremity:  Right  Left  Hip flexors  5/5   5/5   Hip extensors  5/5   5/5   Adductor 5/5  5/5  Abductor 5/5  5/5  Knee flexors  5/5   5/5   Knee extensors  5/5   5/5   Dorsiflexors  5/5   5/5   Plantarflexors  5/5   5/5   Toe extensors  5/5   5/5   Toe flexors  5/5   5/5   Tone (Ashworth scale)  0  0   MSRs:  Right        Left                  brachioradialis 2+  2+  biceps 2+  2+  triceps 2+  2+  patellar 2+  2+  ankle jerk 0  0  Hoffman no  no  plantar response down  down   SENSORY:  Normal and symmetric perception of light touch, pinprick, vibration, and proprioception.  Romberg's sign absent.   COORDINATION/GAIT: Normal finger-to- nose-finger. Gait assisted with walker  IMPRESSION: Peripheral neuropathy contributed by diabetes and ESRD manifesting with predominately numbness and some tingling.  I had a lengthy discussion with patient that numbness is a symptom which is not responsive to medication and unfortunately, there is no treatment for this. Gabapentin will only help with painful symptoms.  She can continue to take gabapentin 100mg  at bedtime. I suggest that she explore options for hand controls to be added to her vehicle  Patient educated on daily foot inspection, fall prevention, and safety precautions around the home.   Thank you for allowing me to participate in patient's care.  If I can answer any additional questions, I would be pleased to do so.    Sincerely,    Joas Motton K. Posey Pronto, DO

## 2020-09-15 ENCOUNTER — Ambulatory Visit: Payer: Medicare Other | Admitting: Cardiology

## 2020-09-29 ENCOUNTER — Ambulatory Visit: Payer: Medicare Other | Admitting: Cardiology

## 2020-09-29 ENCOUNTER — Other Ambulatory Visit: Payer: Self-pay | Admitting: Cardiology

## 2020-09-29 DIAGNOSIS — R0600 Dyspnea, unspecified: Secondary | ICD-10-CM

## 2020-09-29 DIAGNOSIS — R0609 Other forms of dyspnea: Secondary | ICD-10-CM

## 2020-10-06 DIAGNOSIS — E440 Moderate protein-calorie malnutrition: Secondary | ICD-10-CM | POA: Insufficient documentation

## 2020-10-06 DIAGNOSIS — N2589 Other disorders resulting from impaired renal tubular function: Secondary | ICD-10-CM | POA: Insufficient documentation

## 2020-10-06 DIAGNOSIS — E872 Acidosis, unspecified: Secondary | ICD-10-CM | POA: Insufficient documentation

## 2020-10-06 DIAGNOSIS — R82998 Other abnormal findings in urine: Secondary | ICD-10-CM | POA: Insufficient documentation

## 2020-10-06 DIAGNOSIS — K769 Liver disease, unspecified: Secondary | ICD-10-CM | POA: Insufficient documentation

## 2020-10-06 DIAGNOSIS — Z79899 Other long term (current) drug therapy: Secondary | ICD-10-CM | POA: Insufficient documentation

## 2020-10-06 DIAGNOSIS — D513 Other dietary vitamin B12 deficiency anemia: Secondary | ICD-10-CM | POA: Insufficient documentation

## 2020-10-06 DIAGNOSIS — E789 Disorder of lipoprotein metabolism, unspecified: Secondary | ICD-10-CM | POA: Insufficient documentation

## 2020-10-06 DIAGNOSIS — E878 Other disorders of electrolyte and fluid balance, not elsewhere classified: Secondary | ICD-10-CM | POA: Insufficient documentation

## 2020-11-06 ENCOUNTER — Encounter: Payer: Self-pay | Admitting: Cardiology

## 2020-11-06 ENCOUNTER — Other Ambulatory Visit: Payer: Self-pay

## 2020-11-06 ENCOUNTER — Ambulatory Visit: Payer: Medicare Other | Admitting: Cardiology

## 2020-11-06 VITALS — BP 124/74 | HR 93 | Resp 16 | Ht 65.0 in | Wt 200.0 lb

## 2020-11-06 DIAGNOSIS — Z950 Presence of cardiac pacemaker: Secondary | ICD-10-CM

## 2020-11-06 DIAGNOSIS — R0609 Other forms of dyspnea: Secondary | ICD-10-CM

## 2020-11-06 DIAGNOSIS — R0600 Dyspnea, unspecified: Secondary | ICD-10-CM

## 2020-11-06 DIAGNOSIS — I5032 Chronic diastolic (congestive) heart failure: Secondary | ICD-10-CM

## 2020-11-06 DIAGNOSIS — N186 End stage renal disease: Secondary | ICD-10-CM
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
## 2020-11-24 ENCOUNTER — Other Ambulatory Visit: Payer: Self-pay | Admitting: Cardiology

## 2020-11-24 DIAGNOSIS — R0609 Other forms of dyspnea: Secondary | ICD-10-CM

## 2020-11-24 DIAGNOSIS — R0600 Dyspnea, unspecified: Secondary | ICD-10-CM

## 2021-02-09 ENCOUNTER — Other Ambulatory Visit: Payer: Self-pay | Admitting: Cardiology

## 2021-02-09 DIAGNOSIS — R0600 Dyspnea, unspecified: Secondary | ICD-10-CM

## 2021-02-09 DIAGNOSIS — R0609 Other forms of dyspnea: Secondary | ICD-10-CM

## 2021-02-27 ENCOUNTER — Ambulatory Visit
Admission: RE | Admit: 2021-02-27 | Discharge: 2021-02-27 | Disposition: A | Discharge status: Home / Self Care | Payer: Medicare Other | Source: Ambulatory Visit | Attending: Nephrology | Admitting: Nephrology

## 2021-02-27 ENCOUNTER — Other Ambulatory Visit: Payer: Self-pay | Admitting: Nephrology

## 2021-02-27 DIAGNOSIS — R0600 Dyspnea, unspecified: Secondary | ICD-10-CM

## 2021-02-27 DIAGNOSIS — R0609 Other forms of dyspnea: Secondary | ICD-10-CM

## 2021-03-01 ENCOUNTER — Telehealth: Payer: Self-pay | Admitting: Cardiology

## 2021-03-08 NOTE — Telephone Encounter (Signed)
03/08/21 Called pt and she has talked to Dr Joelyn Oms that she thinks it is her gallstones. He has referred her to Dr Raul Del on 04/29. She said if it turns out not to be the gallstones then she will call back and make a appt with you.

## 2021-03-26 ENCOUNTER — Ambulatory Visit: Payer: Medicare Other | Admitting: Student

## 2021-03-28 ENCOUNTER — Ambulatory Visit: Payer: Medicare Other | Admitting: Student

## 2021-03-28 ENCOUNTER — Other Ambulatory Visit: Payer: Self-pay | Admitting: Cardiology

## 2021-03-28 ENCOUNTER — Encounter: Payer: Self-pay | Admitting: Student

## 2021-03-28 ENCOUNTER — Other Ambulatory Visit: Payer: Self-pay

## 2021-03-28 VITALS — BP 94/60 | HR 82 | Temp 97.6°F | Ht 65.0 in | Wt 185.0 lb

## 2021-03-28 DIAGNOSIS — K219 Gastro-esophageal reflux disease without esophagitis: Secondary | ICD-10-CM

## 2021-03-28 DIAGNOSIS — R0600 Dyspnea, unspecified: Secondary | ICD-10-CM

## 2021-03-28 DIAGNOSIS — R0609 Other forms of dyspnea: Secondary | ICD-10-CM

## 2021-03-28 DIAGNOSIS — I5032 Chronic diastolic (congestive) heart failure: Secondary | ICD-10-CM

## 2021-03-28 DIAGNOSIS — N186 End stage renal disease: Secondary | ICD-10-CM

## 2021-03-28 DIAGNOSIS — Z950 Presence of cardiac pacemaker: Secondary | ICD-10-CM
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
## 2021-04-03 ENCOUNTER — Ambulatory Visit: Payer: Medicare Other

## 2021-04-03 ENCOUNTER — Other Ambulatory Visit: Payer: Medicare Other

## 2021-04-03 ENCOUNTER — Other Ambulatory Visit: Payer: Self-pay

## 2021-04-03 DIAGNOSIS — R0600 Dyspnea, unspecified: Secondary | ICD-10-CM

## 2021-04-03 DIAGNOSIS — R0609 Other forms of dyspnea: Secondary | ICD-10-CM

## 2021-04-03 DIAGNOSIS — I5032 Chronic diastolic (congestive) heart failure: Secondary | ICD-10-CM
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
## 2021-04-11 ENCOUNTER — Ambulatory Visit: Payer: Medicare Other | Admitting: Student

## 2021-04-11 ENCOUNTER — Encounter: Payer: Self-pay | Admitting: Student

## 2021-04-11 ENCOUNTER — Other Ambulatory Visit: Payer: Self-pay

## 2021-04-11 VITALS — BP 113/67 | HR 89 | Temp 97.6°F | Ht 65.0 in | Wt 183.0 lb

## 2021-04-11 DIAGNOSIS — I5032 Chronic diastolic (congestive) heart failure: Secondary | ICD-10-CM

## 2021-04-11 DIAGNOSIS — R0600 Dyspnea, unspecified: Secondary | ICD-10-CM

## 2021-04-11 DIAGNOSIS — Z950 Presence of cardiac pacemaker: Secondary | ICD-10-CM

## 2021-04-11 DIAGNOSIS — R0609 Other forms of dyspnea: Secondary | ICD-10-CM

## 2021-04-11 DIAGNOSIS — R1310 Dysphagia, unspecified: Secondary | ICD-10-CM

## 2021-04-17 LAB — BRAIN NATRIURETIC PEPTIDE: BNP: 54.8 pg/mL (ref 0.0–100.0)

## 2021-05-02 ENCOUNTER — Ambulatory Visit: Payer: Medicare Other

## 2021-05-02 ENCOUNTER — Other Ambulatory Visit: Payer: Self-pay

## 2021-05-02 DIAGNOSIS — R0600 Dyspnea, unspecified: Secondary | ICD-10-CM

## 2021-05-02 DIAGNOSIS — I5032 Chronic diastolic (congestive) heart failure: Secondary | ICD-10-CM

## 2021-05-02 DIAGNOSIS — R0609 Other forms of dyspnea: Secondary | ICD-10-CM

## 2021-05-03 ENCOUNTER — Encounter: Payer: Self-pay | Admitting: Nurse Practitioner

## 2021-05-03 ENCOUNTER — Ambulatory Visit (INDEPENDENT_AMBULATORY_CARE_PROVIDER_SITE_OTHER): Payer: Medicare Other | Admitting: Nurse Practitioner

## 2021-05-03 VITALS — BP 100/58 | HR 67 | Ht 65.0 in | Wt 179.0 lb

## 2021-05-03 DIAGNOSIS — R1310 Dysphagia, unspecified: Secondary | ICD-10-CM | POA: Diagnosis not present

## 2021-05-03 DIAGNOSIS — K219 Gastro-esophageal reflux disease without esophagitis: Secondary | ICD-10-CM | POA: Diagnosis not present

## 2021-05-03 DIAGNOSIS — Z862 Personal history of diseases of the blood and blood-forming organs and certain disorders involving the immune mechanism: Secondary | ICD-10-CM | POA: Diagnosis not present

## 2021-05-03 NOTE — Patient Instructions (Signed)
Eat small bites, chew well with liquids in between bites to avoid food impaction.  Call back with update regarding pulmonary status to decide on endoscopy.   If you are age 77 or older, your body mass index should be between 23-30. Your Body mass index is 29.79 kg/m. If this is out of the aforementioned range listed, please consider follow up with your Primary Care Provider.  If you are age 39 or younger, your body mass index should be between 19-25. Your Body mass index is 29.79 kg/m. If this is out of the aformentioned range listed, please consider follow up with your Primary Care Provider.   __________________________________________________________  The [place] [facility] providers would like to encourage you to use [facility] to communicate with providers for non-urgent requests or questions.  Due to long hold times on the telephone, sending your provider a message by [facility] may be a faster and more efficient way to get a response.  Please allow 48 business hours for a response.  Please remember that this is for non-urgent requests.

## 2021-05-03 NOTE — Progress Notes (Signed)
ASSESSMENT AND PLAN    #75 year old female with progressive dysphagia, mainly to solid food.  Other than mild presbyesophagus, esophagram in September 2021 was unremarkable. MBSS same day was also unremarkable.  Unclear why she has had progression of dysphagia over the last couple of months.  No evidence for oral candidiasis on exam.  -- In some cases empiric esophageal dilation can be helpful which we discussed.  EGD would simultaneously allow for reassessment of area where a bleeding gastric polyp was resected in 2020 ( she didn't get recommended 3-6 month follow up EGD).  However, patient not candidate for procedures right now due to worsening dyspnea.  She has undergone a recent cardiac evaluation ( stable chronic diastolic heart failure and MR).  She sees Pulmonary next week.  --Advised patient to eat small bites, chew well with liquids in between bites to avoid food impaction. -- Patient will contact us following Pulmonary evaluation at which point we can decide on endoscopic evaluation.   # Hx of IDA most likely secondary to bleeding gastric polyp ( hyperplastic).  Polyp was resected in February 2020 by Dr. Rush [facility].  The recommended 3-6 month interval EGD was never done.  Patient remains on oral iron.  She gets frequent blood draws between her PCP and nephrologist and tells me her hemoglobin has been normal.  -- Consider screening colonoscopy at time of EGD ( when medically stable).   # GERD, asymptomatic on daily PPI  #Cholelithiasis.  Patient has been having postprandial RUQ pain.  Nephrology has already referred her to a surgeon in [place].  Given worsening dyspnea I doubt she is a surgical candidate right now.    # ESRD on PD  HISTORY OF PRESENT ILLNESS     Chief Complaint : Difficulty swallowing  [place] is a 74 y.o. female , known to Dr. Havery Fry from hospitalization in June 2019 . here at request of Cardiology for evaluation of dysphagia. Patient has a  past medical history significant for HTN, ESRD on PD,chronic diastolic heart failure  iron deficiency anemia, DM2, hypothyroidism, GERD, gout, cardiac pacemaker. See PMH below for any additional history.   We met patient in 2019 (Dr. Havery Fry ) when she was hospitalized with severe anemia, dark heme positive stool.  Patient has not had a colonoscopy but at that time was not physically able to undergo one. She did undergo inpatient EGD With findings of a 10 mm pedunculated polyp with oozing and friable mucosa in the pylorus.  Polyp was biopsied but not removed .  Biopsies compatible with an ulcerated, hyperplastic polyp . Plan was for post Fry follow-up for  follow-up EGD and a colonoscopy but patient never returned due to health issues.    February 2020  -saw patient in the [facility] for worsening anemia, heme positive stool .  It was felt that the known gastric polyp was likely oozing.  On 01/28/19 she underwent EGD (Dr. Rush [facility])  with findings of a single, ulcerated 15 mm pedunculated polyp with bleeding and stigmata of recent bleeding in the prepyloric region of the stomach.  The polyp was resected and retrieved.  Clips placed to close the defect.  Polyp path compatible with hyperplastic or juvenile polyp.  It was recommended that she have repeat upper endoscopy in 3 to 6 months to evaluate the region but this wasn't done.  Patient is on chronic oral iron.   INTERVAL HISTORY:   DYSPHAGIA Patient comes in today with her daughter for evaluation of  dysphagia.  Esophagram and modified barium swallow study September 2021 were unrevealing except for mild presbyesophagus.  Patient says that over the last couple months the dysphagia has gotten worse.  She mainly has problems swallowing solids but yesterday had a hard time getting liquids down.  No odynophagia.  She obviously gets pain when food gets hung up.  She has not noticed any white exudate in mouth or tongue.  No recent steroids.  She does not use  a steroid inhaler but does use Flonase.  She has a history of GERD but says her symptoms are controlled on PPI at night.   SHORTNESS OF BREATH [name] had COVID January 2021 and has struggled with shortness of breath since.  She did see pulmonary August 2021.  Chest CT scan showed scarring in the bottom of her lungs and findings suggestive of small airway disease.  Of note, no esophageal findings.  SOB worse over last 2-3 months. She is SOB on exertion but also at rest when trying to carry on a conversation.  She has no orthopnea in fact shortness of breath is better when lying down  Patient has chronic diastolic heart failure.   .She is followed closely followed by Cardiology. She had an Echo early May 2022. Per Cardiology she has stable.systolic dysfunction. There has been progression of mitral regurgitation from mild to moderate and LA is mildly dilated but dyspnea felt to be out of proportion to valvular disease.  She underwent a Lexiscan stress test yesterday.  Report reviewed in epic and no significant change compared to March 2019.  She has not had seen Pulmonary since August 2021 . Cardiology recommended she follow-up with pulmonary and she has an appointment next week .   CHOLELITHIASIS Patient gives a several month history of postprandial RUQ pain.  Cholelithiasis on ultrasound September 2021.  No biliary duct dilation.  Patient has an upcoming appointment with a surgeon in [facility].    PREVIOUS EVALUATIONS:    June 2019 EGD for heme positive stool anemia.   Food found in stomach so only portions of the fundus and body were adequately visualized.  A 10 mm wide-based pedunculated polyp with mild oozing and friable mucosa was found in the pylorus.  It was not removed to avoid complicating Fry course.   February 2020 EGD for evaluation of occult blood in stool, gastric polyps and therapy of gastric polyps.   No gross lesions in the entire esophagus.  A single, ulcerated 15 mm  pedunculated polyp with bleeding and stigmata of recent bleeding was found in the prepyloric region of the stomach prolapsing into the duodenal bulb.  The polyp was resected, retrieved.  Clips were placed to close the defect  September 2021 esophagram for evaluation of dysphagia.   No esophageal mucosal irregularity, stricture or mass.  Mild esophageal dysmotility consistent with presbyesophagus.  September 2021 modified barium swallow study.   Oropharyngeal swallow integrity was within normal limits.  Pharyngeal phase of swallowing was okay.  No aspiration observed.   Lexiscan Stress test 05/02/21 Nondiagnostic ECG stress. AV paced rhythm and no change with Lexiscan infusion. Myocardial perfusion is normal. LV is small in volume.  Overall LV systolic function is normal without regional wall motion abnormalities. Stress LV EF: 58%.  Compared to 02/13/2018, no significant change.  Low risk study.   Past Medical History:  Diagnosis Date  . Arthritis    "knees, thumbs" (03/25/2018)  . AV block, Mobitz 2 03/25/2018  . CKD (chronic kidney disease),  stage IV ([place])   . Complication of anesthesia    Reports had hard time waking up in the past  . Diet-controlled diabetes mellitus ([place])   . Encounter for care of pacemaker 09/07/2019  . End stage renal failure on dialysis ([place])    T, Th, Sat [employer]  . GERD (gastroesophageal reflux disease)   . Gout    "on daily RX" (03/25/2018)  . Heart murmur   . High cholesterol   . Hypothyroidism   . Iron deficiency anemia    "had to get an iron infusion"  . Migraine    "used to have them growing up" (03/25/2018)  . Presence of permanent cardiac pacemaker 03/25/2018     Past Surgical History:  Procedure Laterality Date  . APPENDECTOMY    . AV FISTULA PLACEMENT Right 06/29/2019   Procedure: ARTERIOVENOUS (AV) FISTULA CREATION RIGHT UPPER  ARM;  Surgeon: Waynetta Sandy, MD;  Location: [place];  Service: Vascular;  Laterality: Right;  .  BASCILIC VEIN TRANSPOSITION Right 09/08/2019   Procedure: BASILIC VEIN TRANSPOSITION RIGHT SECOND STAGE;  Surgeon: Waynetta Sandy, MD;  Location: [place];  Service: Vascular;  Laterality: Right;  . BIOPSY  05/19/2018   Procedure: BIOPSY;  Surgeon: Ladene Artist, MD;  Location: [place];  Service: Endoscopy;;  . Consuela Mimes W/ RETROGRADES Bilateral 01/14/2019   Procedure: CYSTOSCOPY WITH RETROGRADE PYELOGRAM BILATERAL HYDRODISTENTION;  Surgeon: Ardis Hughs, MD;  Location: [facility];  Service: Urology;  Laterality: Bilateral;  . ESOPHAGOGASTRODUODENOSCOPY N/A 05/19/2018   Procedure: ESOPHAGOGASTRODUODENOSCOPY (EGD);  Surgeon: Ladene Artist, MD;  Location[facility];  Service: Endoscopy;  Laterality: N/A;  . ESOPHAGOGASTRODUODENOSCOPY (EGD) WITH PROPOFOL N/A 01/28/2019   Procedure: ESOPHAGOGASTRODUODENOSCOPY (EGD) WITH PROPOFOL;  Surgeon: [place] Telford Nab., MD;  Location: [place];  Service: Gastroenterology;  Laterality: N/A;  . INSERT / REPLACE / REMOVE PACEMAKER  03/25/2018  . IR FLUORO GUIDE CV LINE RIGHT  05/18/2018  . IR FLUORO GUIDE CV LINE RIGHT  06/24/2019  . IR LUMBAR DISC ASPIRATION W/IMG GUIDE  05/15/2018  . IR NEPHROSTOMY EXCHANGE RIGHT  05/28/2019  . IR NEPHROSTOMY EXCHANGE RIGHT  07/19/2019  . IR NEPHROSTOMY PLACEMENT RIGHT  04/09/2019  . IR REMOVAL TUN CV CATH W/O FL  07/23/2018  . IR US GUIDE VASC ACCESS RIGHT  05/18/2018  . IR US GUIDE VASC ACCESS RIGHT  06/24/2019  . KNEE ARTHROSCOPY Bilateral   . PACEMAKER IMPLANT N/A 03/25/2018   Procedure: PACEMAKER IMPLANT;  Surgeon: Evans Lance, MD;  Location: [place] [facility];  Service: Cardiovascular;  Laterality: N/A;  . PACEMAKER PLACEMENT Left 03/2018  . POLYPECTOMY  01/28/2019   Procedure: POLYPECTOMY;  Surgeon: Mansouraty, Telford Nab., MD;  Location: [place];  Service: Gastroenterology;;  . RIGHT HEART CATH N/A 03/20/2018   Procedure: RIGHT HEART CATH;  Surgeon: Nigel Mormon, MD;  Location:  [place] [facility];  Service: Cardiovascular;  Laterality: N/A;  . TEE WITHOUT CARDIOVERSION N/A 03/20/2018   Procedure: TRANSESOPHAGEAL ECHOCARDIOGRAM (TEE);  Surgeon: Nigel Mormon, MD;  Location: [facility] ENDOSCOPY;  Service: Cardiovascular;  Laterality: N/A;  . TONSILLECTOMY    . URETEROSCOPY WITH HOLMIUM LASER LITHOTRIPSY Right 01/14/2019   Procedure: URETEROSCOPY WITH HOLMIUM LASER LITHOTRIPSY BLADDER BIOPSIES RIGHT STENT PLACEMENT;  Surgeon: Ardis Hughs, MD;  Location: [facility];  Service: Urology;  Laterality: Right;   Family History  Problem Relation Age of Onset  . Hypertension Mother   . Diabetes Mellitus II Father   . Heart  disease Father   . Heart attack Father   . Gastric cancer Brother   . Diabetes Mellitus II Brother   . Stomach cancer Brother    Social History   Tobacco Use  . Smoking status: Never Smoker  . Smokeless tobacco: Never Used  Vaping Use  . Vaping Use: Never used  Substance Use Topics  . Alcohol use: Not Currently  . Drug use: Never   Current Outpatient Medications  Medication Sig Dispense Refill  . acetaminophen (TYLENOL) 325 MG tablet Take 650 mg by mouth every 6 (six) hours as needed.    [name] Kitchen albuterol (VENTOLIN HFA) 108 (90 Base) MCG/ACT inhaler INHALE 2 PUFFS BY MOUTH EVERY 4 TO 6 HOURS AS NEEDED FOR WHEEZING, SEE ADMIN INSTRUCTIONS 9 g 0  . Alpha-Lipoic Acid 150 MG CAPS Take by mouth.    [name] Kitchen ascorbic acid (VITAMIN C) 500 MG tablet Take 500 mg by mouth daily.    . B Complex-C-Zn-Folic Acid (DIALYVITE 546 WITH ZINC) 0.8 MG TABS Take 1 tablet by mouth daily.     . Biotin 10000 MCG TABS Take 10,000 mcg by mouth daily with breakfast.    . calcitRIOL (ROCALTROL) 0.5 MCG capsule Take 0.5 mcg by mouth daily.    . cetirizine (ZYRTEC) 10 MG tablet Take 10 mg by mouth at bedtime.     . Cholecalciferol (VITAMIN D3) 50 MCG (2000 UT) TABS Take 2,000 mg by mouth daily with breakfast.    . cinacalcet (SENSIPAR) 60 MG tablet Take 60 mg by mouth daily.    .  ferric citrate (AURYXIA) 1 GM 210 MG(Fe) tablet Take 420 mg by mouth 3 (three) times daily with meals.     . fluticasone (FLONASE) 50 MCG/ACT nasal spray Place 2 sprays into both nostrils daily as needed for allergies or rhinitis.    [name] Kitchen HYDROcodone-acetaminophen (NORCO/VICODIN) 5-325 MG tablet Take 1 tablet by mouth 6 (six) times daily.    [name] Kitchen levothyroxine (SYNTHROID) 75 MCG tablet Take 1 tablet (75 mcg total) by mouth daily before breakfast. 30 tablet 1  . omeprazole (PRILOSEC) 20 MG capsule TAKE 1 CAPSULE BY MOUTH ONCE DAILY BEFORE BREAKFAST 30 capsule 0  . senna (SENOKOT) 8.6 MG TABS tablet Take 1 tablet by mouth at bedtime.    . simvastatin (ZOCOR) 40 MG tablet Take 1 tablet (40 mg total) by mouth at bedtime.     No current facility-administered medications for this visit.   Allergies  Allergen Reactions  . Baclofen Other (See Comments)    Somnolence- PUT THE PATIENT INTO A COMA AND "ALMOST KILLED HER"   . Tape Other (See Comments)    SKIN WILL TEAR AND BRUISE EASILY!!  . Other Other (See Comments)    Patient has a high tolerance to antibiotics- has taken a lot of them during the course of her life  . Codeine Other (See Comments)    Increased pain and couldn't sleep  . Dextromethorphan-Guaifenesin Other (See Comments)    Unknown reaction  . Latex Rash    Certain briefs for incontinence break her out     Review of Systems: Positive for fatigue. No urinary symptoms. No chest pain.   PHYSICAL EXAM :    Wt Readings from Last 3 Encounters:  04/11/21 183 lb (83 kg)  03/28/21 185 lb (83.9 kg)  11/06/20 200 lb (90.7 kg)    BP (!) 100/58   Pulse 67   Ht $R'5\' 5"'uC$  (1.651 m)   Wt 179 lb (81.2 kg)   SpO2  98%   BMI 29.79 kg/m  Constitutional:  Pleasant female in no acute distress. Sitting in wheelchair.  Psychiatric: Normal mood and affect. Behavior is normal. EENT: Pupils normal.  Conjunctivae are normal. No scleral icterus. Neck supple Mouth: No white exudate.  Cardiovascular:  Normal rate, regular rhythm, 1+ bilateral pedal edema Pulmonary/chest: She very intermittently gasps for air during conversation.  No wheezing, rhonchi or crackles, auscultation.  On expiration she does have bilateral abnormal breath sounds ( squeaky like) Abdominal: Limited exam in wheelchair .  Abdomen is soft, nondistended, nontender. Bowel sounds active throughout.  Neurological: Alert and oriented to person place and time. Skin: Skin is warm and dry. No rashes noted.  Tye Savoy, NP  05/03/2021, 11:24 AM  Cc:  Lawerance Cruel, PA-C

## 2021-05-03 NOTE — Progress Notes (Signed)
Agree with assessment as outlined. Agree she should have an EGD once evaluated by pulmonary. She should contact us after their evaluation in regards to scheduling and we can determine where is most appropriate place do to this ([facility]). Her cardiac function appears okay following recent evaluation.

## 2021-05-05 ENCOUNTER — Other Ambulatory Visit: Payer: Self-pay | Admitting: Cardiology

## 2021-05-05 DIAGNOSIS — K219 Gastro-esophageal reflux disease without esophagitis: Secondary | ICD-10-CM

## 2021-05-05 DIAGNOSIS — R0600 Dyspnea, unspecified: Secondary | ICD-10-CM

## 2021-05-05 DIAGNOSIS — R0609 Other forms of dyspnea: Secondary | ICD-10-CM

## 2021-05-09 ENCOUNTER — Other Ambulatory Visit: Payer: Self-pay

## 2021-05-09 ENCOUNTER — Ambulatory Visit (INDEPENDENT_AMBULATORY_CARE_PROVIDER_SITE_OTHER): Payer: Medicare Other | Admitting: Pulmonary Disease

## 2021-05-09 ENCOUNTER — Encounter: Payer: Self-pay | Admitting: Pulmonary Disease

## 2021-05-09 VITALS — BP 100/60 | HR 66 | Ht 65.0 in | Wt 181.0 lb

## 2021-05-09 DIAGNOSIS — R0609 Other forms of dyspnea: Secondary | ICD-10-CM

## 2021-05-09 DIAGNOSIS — R0600 Dyspnea, unspecified: Secondary | ICD-10-CM | POA: Diagnosis not present
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
## 2021-05-09 NOTE — Patient Instructions (Addendum)
Nice to meet you  Try albuterol 2-3 times a day and see if it helps.  If it does help let me know and we can try a stronger inhaler.  We will get breathing or pulmonary function tests in the coming days to better evaluate your shortness of breath.  Based on the results we can decide the next best steps.  Return to clinic in 3 months or sooner as needed with Dr. Silas Flood

## 2021-05-09 NOTE — Progress Notes (Signed)
Synopsis: Referred in August 2021 for DOE by [name], MD.  Subjective:   PATIENT ID: Erika Fry GENDER: female DOB: Jul 06, 1946, MRN: [id number]  Chief Complaint  Patient presents with  . Shortness of Breath     75 year old whom we are seeing for follow-up of dyspnea on exertion.  Previous patient of Dr. Carlis Abbott.  Her note 2021 reviewed.  Most recent cardiology note x2 reviewed.  Patient with 2 to 3-year history of dyspnea on exertion.  Significant dyspnea with minimal exertion on flat surfaces.  Cannot walk more than a few feet without stopping.  Is been present for many months to year.  Notes onset of dyspnea in the setting of diagnosis of heart block.  Pacemaker was placed.  Since then she has had worsening dyspnea on exertion.  Prior that denies much issue with breathing.  Uses albuterol with minimal to no relief.  No timing during the day when things are better or worse.  Denies seasonal allergies or seasonal worsening of symptoms.  No seasonal changes make things better.  No environmental factors that can contribute to her symptoms.  Interestingly, symptoms improved when she lies supine or on her back.  Denies significant cough.  No sputum production.  Notes had COVID 12/2019.  Did not need oxygen.  Feels worse her dyspnea has worsened since then overall, much less active over the last 2 or 3 years than prior.  She endorses ongoing dysphagia and odynophagia.  Occasionally refluxes food, tries to vomit foods.  Has not needed to vomit up food stuck in her esophagus over the last month or 2.  Was seen recently by GI.  This note reviewed.  CT chest high-resolution 07/2020 reviewed and interpreted as mild bandlike infiltrate in bilateral bases, mild postinfectious/inflammatory fibrosis versus atelectasis with very mild scattered bronchiectasis.  HPI at initial visit; Erika Fry is a 75 y/o woman who presents for evaluation of dyspnea on exertion.  She is accompanied today by her  daughter-in-law Museum/gallery conservator.  She was referred by her cardiologist for evaluation after having a stable echocardiogram this spring.  She has a permanent pacemaker for high grade AV block and chronic HFpEF.  Her dyspnea on exertion has been ongoing since around February 2021, several weeks after she had Covid in January.  She was hospitalized from 1/23-1/27 to receive remdesivir and steroids.  She is not discharged on oxygen.  She felt great at discharge.  But a month later she began having dyspnea on exertion that has worsened over time.  She denies coughing, sputum production, wheezing, or orthopnea.  She has tried her rescue inhaler which did not help her symptoms.  She uses walker at home.  She feels weak, hot, sweaty due to her shortness of breath.  She feels like she has to sit down or lay down every few minutes.  Her best day for symptoms is Monday where she has not had dialysis in 2 days.  She goes to dialysis on Tuesday, Thursday, Saturday and has to sleep for the rest of the day after her sessions.  She feels tired even the day after.  She denies change from her baseline lower extremity edema, which she manages with TED hose.  She has never smoked or vape.  Per her report, her hemoglobin is around 10 and has been stable after being low previously.  She has received blood transfusions in the remote past.  She gets iron infusions with dialysis.  She has not yet been vaccinated for Covid.  Shortness of Breath Pertinent negatives include no chest pain or fever.     Past Medical History:  Diagnosis Date  . Arthritis    "knees, thumbs" (03/25/2018)  . AV block, [place] 2 03/25/2018  . CKD (chronic kidney disease), stage IV ([place])   . Complication of anesthesia    Reports had hard time waking up in the past  . Diet-controlled diabetes mellitus ([place])   . Encounter for care of pacemaker 09/07/2019  . End stage renal failure on dialysis ([place])    T, Th, Sat [employer]  . GERD (gastroesophageal reflux disease)    . Gout    "on daily RX" (03/25/2018)  . Heart murmur   . High cholesterol   . Hypothyroidism   . Iron deficiency anemia    "had to get an iron infusion"  . Migraine    "used to have them growing up" (03/25/2018)  . Presence of permanent cardiac pacemaker 03/25/2018     Family History  Problem Relation Age of Onset  . Hypertension Mother   . Diabetes Mellitus II Father   . Heart disease Father   . Heart attack Father   . Gastric cancer Brother   . Diabetes Mellitus II Brother   . Stomach cancer Brother      Past Surgical History:  Procedure Laterality Date  . APPENDECTOMY    . AV FISTULA PLACEMENT Right 06/29/2019   Procedure: ARTERIOVENOUS (AV) FISTULA CREATION RIGHT UPPER  ARM;  Surgeon: Waynetta Sandy, MD;  Location: [facility];  Service: Vascular;  Laterality: Right;  . BASCILIC VEIN TRANSPOSITION Right 09/08/2019   Procedure: BASILIC VEIN TRANSPOSITION RIGHT SECOND STAGE;  Surgeon: Waynetta Sandy, MD;  Location: [facility];  Service: Vascular;  Laterality: Right;  . BIOPSY  05/19/2018   Procedure: BIOPSY;  Surgeon: Ladene Artist, MD;  Location: [facility];  Service: Endoscopy;;  . Consuela Mimes W/ RETROGRADES Bilateral 01/14/2019   Procedure: CYSTOSCOPY WITH RETROGRADE PYELOGRAM BILATERAL HYDRODISTENTION;  Surgeon: Ardis Hughs, MD;  Location: [facility];  Service: Urology;  Laterality: Bilateral;  . ESOPHAGOGASTRODUODENOSCOPY N/A 05/19/2018   Procedure: ESOPHAGOGASTRODUODENOSCOPY (EGD);  Surgeon: Ladene Artist, MD;  Location[facility] ENDOSCOPY;  Service: Endoscopy;  Laterality: N/A;  . ESOPHAGOGASTRODUODENOSCOPY (EGD) WITH PROPOFOL N/A 01/28/2019   Procedure: ESOPHAGOGASTRODUODENOSCOPY (EGD) WITH PROPOFOL;  Surgeon: Rush Landmark Telford Nab., MD;  Location: [facility];  Service: Gastroenterology;  Laterality: N/A;  . INSERT / REPLACE / REMOVE PACEMAKER  03/25/2018  . IR FLUORO GUIDE CV LINE RIGHT  05/18/2018  . IR FLUORO GUIDE CV LINE RIGHT  06/24/2019  . IR  LUMBAR DISC ASPIRATION W/IMG GUIDE  05/15/2018  . IR NEPHROSTOMY EXCHANGE RIGHT  05/28/2019  . IR NEPHROSTOMY EXCHANGE RIGHT  07/19/2019  . IR NEPHROSTOMY PLACEMENT RIGHT  04/09/2019  . IR REMOVAL TUN CV CATH W/O FL  07/23/2018  . IR US GUIDE VASC ACCESS RIGHT  05/18/2018  . IR US GUIDE VASC ACCESS RIGHT  06/24/2019  . KNEE ARTHROSCOPY Bilateral   . PACEMAKER IMPLANT N/A 03/25/2018   Procedure: PACEMAKER IMPLANT;  Surgeon: Evans Lance, MD;  Location: [facility];  Service: Cardiovascular;  Laterality: N/A;  . PACEMAKER PLACEMENT Left 03/2018  . POLYPECTOMY  01/28/2019   Procedure: POLYPECTOMY;  Surgeon: Mansouraty, Telford Nab., MD;  Location: [facility];  Service: Gastroenterology;;  . RIGHT HEART CATH N/A 03/20/2018   Procedure: RIGHT HEART CATH;  Surgeon: Nigel Mormon, MD;  Location: [facility];  Service: Cardiovascular;  Laterality:  N/A;  . TEE WITHOUT CARDIOVERSION N/A 03/20/2018   Procedure: TRANSESOPHAGEAL ECHOCARDIOGRAM (TEE);  Surgeon: Nigel Mormon, MD;  Location[facility] END[facility];  Service: Cardiovascular;  Laterality: N/A;  . TONSILLECTOMY    . URETEROSCOPY WITH HOLMIUM LASER LITHOTRIPSY Right 01/14/2019   Procedure: URETEROSCOPY WITH HOLMIUM LASER LITHOTRIPSY BLADDER BIOPSIES RIGHT STENT PLACEMENT;  Surgeon: Ardis Hughs, MD;  Location: [facility];  Service: Urology;  Laterality: Right;    Social History   Socioeconomic History  . Marital status: Widowed    Spouse name: Not on file  . Number of children: 3  . Years of education: Not on file  . Highest education level: Not on file  Occupational History  . Not on file  Tobacco Use  . Smoking status: Never Smoker  . Smokeless tobacco: Never Used  Vaping Use  . Vaping Use: Never used  Substance and Sexual Activity  . Alcohol use: Not Currently  . Drug use: Never  . Sexual activity: Not Currently  Other Topics Concern  . Not on file  Social History Narrative   Right Handed   Lives in a one  story home with basement   Drinks no caffeine    Social Determinants of Health   Financial Resource Strain: Not on file  Food Insecurity: Not on file  Transportation Needs: Not on file  Physical Activity: Not on file  Stress: Not on file  Social Connections: Not on file  Intimate Partner Violence: Not on file     Allergies  Allergen Reactions  . Baclofen Other (See Comments)    Somnolence- PUT THE PATIENT INTO A COMA AND "ALMOST KILLED HER"   . Tape Other (See Comments)    SKIN WILL TEAR AND BRUISE EASILY!!  . Other Other (See Comments)    Patient has a high tolerance to antibiotics- has taken a lot of them during the course of her life  . Codeine Other (See Comments)    Increased pain and couldn't sleep  . Dextromethorphan-Guaifenesin Other (See Comments)    Unknown reaction  . Latex Rash    Certain briefs for incontinence break her out     Immunization History  Administered Date(s) Administered  . Hepatitis B, adult 07/22/2019, 08/19/2019, 09/23/2019, 01/20/2020  . Hepatitis B, ped/adol 07/22/2019, 08/19/2019, 09/23/2019, 01/20/2020  . Hepb-cpg 10/10/2020  . Influenza Split 08/17/2018  . Influenza, High Dose Seasonal PF 08/26/2019  . Influenza,inj,Quad PF,6+ Mos 08/17/2018  . Influenza-Unspecified 08/26/2019, 08/31/2020  . PFIZER(Purple Top)SARS-COV-2 Vaccination 08/20/2020, 09/03/2020  . Pneumococcal Conjugate-13 11/02/2019  . Pneumococcal Polysaccharide-23 05/01/2018    Outpatient Medications Prior to Visit  Medication Sig Dispense Refill  . acetaminophen (TYLENOL) 325 MG tablet Take 650 mg by mouth every 6 (six) hours as needed.    [name] Kitchen albuterol (VENTOLIN HFA) 108 (90 Base) MCG/ACT inhaler INHALE 2 PUFFS BY MOUTH EVERY 4 TO 6 HOURS AS NEEDED FOR WHEEZING, SEE ADMIN INSTRUCTIONS 9 g 0  . Alpha-Lipoic Acid 150 MG CAPS Take by mouth.    [name] Kitchen ascorbic acid (VITAMIN C) 500 MG tablet Take 500 mg by mouth daily.    . B Complex-C-Zn-Folic Acid (DIALYVITE 794 WITH ZINC) 0.8  MG TABS Take 1 tablet by mouth daily.     . Biotin 10000 MCG TABS Take 10,000 mcg by mouth daily with breakfast.    . calcitRIOL (ROCALTROL) 0.5 MCG capsule Take 0.5 mcg by mouth daily.    . cetirizine (ZYRTEC) 10 MG tablet Take 10 mg by mouth at bedtime.     [name] Kitchen  Cholecalciferol (VITAMIN D3) 50 MCG (2000 UT) TABS Take 2,000 mg by mouth daily with breakfast.    . cinacalcet (SENSIPAR) 60 MG tablet Take 60 mg by mouth daily.    . ferric citrate (AURYXIA) 1 GM 210 MG(Fe) tablet Take 420 mg by mouth 3 (three) times daily with meals.     . fluticasone (FLONASE) 50 MCG/ACT nasal spray Place 2 sprays into both nostrils daily as needed for allergies or rhinitis.    [name] Kitchen HYDROcodone-acetaminophen (NORCO/VICODIN) 5-325 MG tablet Take 1 tablet by mouth 6 (six) times daily.    [name] Kitchen levothyroxine (SYNTHROID) 75 MCG tablet Take 1 tablet (75 mcg total) by mouth daily before breakfast. 30 tablet 1  . omeprazole (PRILOSEC) 20 MG capsule TAKE 1 CAPSULE BY MOUTH ONCE DAILY BEFORE BREAKFAST 30 capsule 0  . senna (SENOKOT) 8.6 MG TABS tablet Take 1 tablet by mouth at bedtime.    . simvastatin (ZOCOR) 40 MG tablet Take 1 tablet (40 mg total) by mouth at bedtime.     No facility-administered medications prior to visit.    Review of Systems  Constitutional: Negative for chills and fever.  Respiratory: Positive for shortness of breath.   Cardiovascular: Negative for chest pain.       Chronic LE edema unchanged     Objective:   Vitals:   05/09/21 1354  BP: 100/60  Pulse: 66  SpO2: 97%  Weight: 181 lb (82.1 kg)  Height: 5\' 5"  (1.651 m)   97% on  RA BMI Readings from Last 3 Encounters:  05/09/21 30.12 kg/m  05/03/21 29.79 kg/m  04/11/21 30.45 kg/m   Wt Readings from Last 3 Encounters:  05/09/21 181 lb (82.1 kg)  05/03/21 179 lb (81.2 kg)  04/11/21 183 lb (83 kg)    Physical Exam Vitals reviewed.  Constitutional:      General: She is not in acute distress.    Appearance: She is obese. She is not  ill-appearing.  HENT:     Head: Normocephalic and atraumatic.     Nose:     Comments: Deferred due to masking requirement.    Mouth/Throat:     Comments: Deferred due to masking requirement. Eyes:     General: No scleral icterus. Cardiovascular:     Rate and Rhythm: Normal rate and regular rhythm.     Comments: RSB systolic murmur 2/6 Pulmonary:     Comments: CTAB, breathing comfortably on RA. No conversational dyspnea. Abdominal:     General: There is no distension.     Palpations: Abdomen is soft.  Musculoskeletal:        General: Swelling present. No deformity.     Cervical back: Neck supple.  Skin:    General: Skin is warm and dry.     Findings: No rash.     Comments: Leg wounds not examined  Neurological:     General: No focal deficit present.     Mental Status: She is alert.     Coordination: Coordination normal.  Psychiatric:        Mood and Affect: Mood normal.        Behavior: Behavior normal.      CBC    Component Value Date/Time   WBC 7.6 02/02/2020 2138   RBC 3.42 (L) 02/02/2020 2138   HGB 10.0 (L) 02/02/2020 2138   HCT 32.1 (L) 02/02/2020 2138   HCT 20.1 (L) 05/13/2018 0427   PLT 167 02/02/2020 2138   MCV 93.9 02/02/2020 2138   MCH 29.2 02/02/2020  2138   MCHC 31.2 02/02/2020 2138   RDW 18.1 (H) 02/02/2020 2138   LYMPHSABS 0.6 (L) 12/29/2019 0332   MONOABS 0.5 12/29/2019 0332   EOSABS 0.0 12/29/2019 0332   BASOSABS 0.0 12/29/2019 0332    CHEMISTRY No results for input(s): NA, K, CL, CO2, GLUCOSE, BUN, CREATININE, CALCIUM, MG, PHOS in the last 168 hours. CrCl cannot be calculated (Patient's most recent lab result is older than the maximum 21 days allowed.). Bicarb 28  Chest Imaging- films reviewed: CXR, 1 view 02/03/2020-healed right posterolateral rib fractures.  Increased interstitial markings bilaterally, no effusions.  Compared to previous films, January 2021 with peripheral patchy opacities and July 2020 with left dependent pleural effusion  and increasing interstitial markings.  PPM in place.  Pulmonary Functions Testing Results: No flowsheet data found.   Echocardiogram 04/17/2020: LVEF greater than 70%, mild LVOT obstruction, normal LV size, moderate concentric LVH.  Grade 1 diastolic dysfunction.  Normal RV.  Mitral valve calcification with mildly restricted leaflets, no stenosis, grade 1 MR.      Assessment & Plan:     ICD-10-CM   1. DOE (dyspnea on exertion)  R06.00 Pulmonary function test    DOE: since Covid infection but sounds like started around time of pacemaker placement. Chronic anemia sounds stable per her report, so less likely contributing to her progressive symptoms.  Recent TTE with diastolic dysfunction and mild left atrial enlargement as well as mitral regurgitation.  Possible worsening diastolic dysfucntion and MVR with normal increased systemic blood pressure in response to exercise.  Also worry that recent Lexiscan did not have increase in heart rate and was paced the entire time.  Difficult me to interpret recent pacemaker investigation but seems like she is always paced.  Possible she has chronotropic incompetence leading to dyspnea.  Her CT scan 07/2020 shows very mild changes.  She does not endorse seasonal allergies or other atopic symptoms to raise alarm for asthma.  Does not improve with albuterol.  Will obtain PFTs in the coming days.  Suspicion for pulmonary contributor is low.  Suspect largest contributor is likely deconditioning given her lack of activity over the timeframe that corresponds with her worsening symptoms. -PFTs ordered --Resume routine albuterol use (at least 2 times daily) and if more effective will consider more intense inhaler regimen  Dysphagia/odynophagia: Worsened over the last 1 to 2 months.  20 pound weight loss.  No objective evidence she would not tolerate EGD.  PFTs as above.    RTC in 3 months.    Lanier Clam, MD [facility] 05/09/2021 3:05 PM    I spent 65 minutes in care of this patient including face-to-face visit, review of records, coordination of care.

## 2021-05-10 ENCOUNTER — Encounter: Payer: Self-pay | Admitting: Student

## 2021-05-10 ENCOUNTER — Telehealth: Payer: Medicare Other | Admitting: Student

## 2021-05-10 ENCOUNTER — Ambulatory Visit: Payer: Medicare Other | Admitting: Student

## 2021-05-10 VITALS — BP 102/87 | HR 83 | Ht 65.0 in | Wt 180.6 lb

## 2021-05-10 DIAGNOSIS — I5032 Chronic diastolic (congestive) heart failure: Secondary | ICD-10-CM

## 2021-05-10 DIAGNOSIS — R0609 Other forms of dyspnea: Secondary | ICD-10-CM

## 2021-05-10 DIAGNOSIS — R0600 Dyspnea, unspecified: Secondary | ICD-10-CM

## 2021-05-10 DIAGNOSIS — Z950 Presence of cardiac pacemaker: Secondary | ICD-10-CM
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
## 2021-05-17 ENCOUNTER — Other Ambulatory Visit: Payer: Self-pay

## 2021-05-17 DIAGNOSIS — R0600 Dyspnea, unspecified: Secondary | ICD-10-CM

## 2021-05-17 DIAGNOSIS — K219 Gastro-esophageal reflux disease without esophagitis: Secondary | ICD-10-CM

## 2021-05-17 DIAGNOSIS — R0609 Other forms of dyspnea: Secondary | ICD-10-CM

## 2021-05-17 MED ORDER — OMEPRAZOLE 20 MG PO CPDR: DELAYED_RELEASE_CAPSULE | ORAL | 0 refills | Status: AC

## 2021-05-18 ENCOUNTER — Other Ambulatory Visit (HOSPITAL_COMMUNITY)
Admission: RE | Admit: 2021-05-18 | Discharge: 2021-05-18 | Disposition: A | Discharge status: Home / Self Care | Payer: Medicare Other | Source: Ambulatory Visit | Attending: Pulmonary Disease | Admitting: Pulmonary Disease

## 2021-05-18 DIAGNOSIS — Z01812 Encounter for preprocedural laboratory examination: Secondary | ICD-10-CM | POA: Diagnosis present

## 2021-05-18 DIAGNOSIS — Z20822 Contact with and (suspected) exposure to covid-19: Secondary | ICD-10-CM | POA: Insufficient documentation

## 2021-05-18 LAB — SARS CORONAVIRUS 2 (TAT 6-24 HRS): SARS Coronavirus 2: NEGATIVE

## 2021-05-21 ENCOUNTER — Other Ambulatory Visit: Payer: Self-pay

## 2021-05-21 ENCOUNTER — Ambulatory Visit (INDEPENDENT_AMBULATORY_CARE_PROVIDER_SITE_OTHER): Payer: Medicare Other | Admitting: Pulmonary Disease

## 2021-05-21 DIAGNOSIS — R0600 Dyspnea, unspecified: Secondary | ICD-10-CM

## 2021-05-21 DIAGNOSIS — R0609 Other forms of dyspnea: Secondary | ICD-10-CM

## 2021-05-21 LAB — PULMONARY FUNCTION TEST
DL/VA % pred: 83 %
DL/VA: 3.37 ml/min/mmHg/L
DLCO cor % pred: 59 %
DLCO cor: 11.68 ml/min/mmHg
DLCO unc % pred: 59 %
DLCO unc: 11.68 ml/min/mmHg
FEF 25-75 Post: 1.89 L/sec
FEF 25-75 Pre: 1.43 L/sec
FEF2575-%Change-Post: 32 %
FEF2575-%Pred-Post: 117 %
FEF2575-%Pred-Pre: 88 %
FEV1-%Change-Post: 7 %
FEV1-%Pred-Post: 90 %
FEV1-%Pred-Pre: 84 %
FEV1-Post: 1.93 L
FEV1-Pre: 1.8 L
FEV1FVC-%Change-Post: 6 %
FEV1FVC-%Pred-Pre: 103 %
FEV6-%Change-Post: 3 %
FEV6-%Pred-Post: 85 %
FEV6-%Pred-Pre: 83 %
FEV6-Post: 2.33 L
FEV6-Pre: 2.25 L
FEV6FVC-%Pred-Post: 105 %
FEV6FVC-%Pred-Pre: 105 %
FVC-%Change-Post: 0 %
FVC-%Pred-Post: 81 %
FVC-%Pred-Pre: 81 %
FVC-Post: 2.33 L
FVC-Pre: 2.33 L
Post FEV1/FVC ratio: 83 %
Post FEV6/FVC ratio: 100 %
Pre FEV1/FVC ratio: 77 %
Pre FEV6/FVC Ratio: 100 %
RV % pred: 86 %
RV: 2.06 L
TLC % pred: 83 %
TLC: 4.35 L

## 2021-05-21 NOTE — Patient Instructions (Signed)
Full PFT performed today. °

## 2021-05-21 NOTE — Progress Notes (Signed)
Full PFT performed today. °

## 2021-05-29 ENCOUNTER — Encounter (HOSPITAL_COMMUNITY): Payer: Self-pay | Admitting: Emergency Medicine

## 2021-05-29 ENCOUNTER — Emergency Department (HOSPITAL_COMMUNITY): Payer: Medicare Other

## 2021-05-29 ENCOUNTER — Other Ambulatory Visit: Payer: Self-pay

## 2021-05-29 ENCOUNTER — Emergency Department (HOSPITAL_COMMUNITY)
Admission: EM | Admit: 2021-05-29 | Discharge: 2021-05-30 | Disposition: A | Discharge status: Home / Self Care | Payer: Medicare Other | Attending: Emergency Medicine | Admitting: Emergency Medicine

## 2021-05-29 DIAGNOSIS — Z91040 Latex allergy status: Secondary | ICD-10-CM | POA: Insufficient documentation

## 2021-05-29 DIAGNOSIS — I5033 Acute on chronic diastolic (congestive) heart failure: Secondary | ICD-10-CM | POA: Insufficient documentation

## 2021-05-29 DIAGNOSIS — Z955 Presence of coronary angioplasty implant and graft: Secondary | ICD-10-CM | POA: Diagnosis not present

## 2021-05-29 DIAGNOSIS — N186 End stage renal disease: Secondary | ICD-10-CM | POA: Insufficient documentation

## 2021-05-29 DIAGNOSIS — W01198A Fall on same level from slipping, tripping and stumbling with subsequent striking against other object, initial encounter: Secondary | ICD-10-CM | POA: Diagnosis not present

## 2021-05-29 DIAGNOSIS — E875 Hyperkalemia: Secondary | ICD-10-CM | POA: Diagnosis not present

## 2021-05-29 DIAGNOSIS — I132 Hypertensive heart and chronic kidney disease with heart failure and with stage 5 chronic kidney disease, or end stage renal disease: Secondary | ICD-10-CM | POA: Insufficient documentation

## 2021-05-29 DIAGNOSIS — E039 Hypothyroidism, unspecified: Secondary | ICD-10-CM | POA: Diagnosis not present

## 2021-05-29 DIAGNOSIS — S0990XA Unspecified injury of head, initial encounter: Secondary | ICD-10-CM | POA: Diagnosis present

## 2021-05-29 DIAGNOSIS — E1122 Type 2 diabetes mellitus with diabetic chronic kidney disease: Secondary | ICD-10-CM | POA: Insufficient documentation

## 2021-05-29 DIAGNOSIS — Z992 Dependence on renal dialysis: Secondary | ICD-10-CM | POA: Insufficient documentation

## 2021-05-29 DIAGNOSIS — S060X0A Concussion without loss of consciousness, initial encounter: Secondary | ICD-10-CM | POA: Diagnosis not present

## 2021-05-29 DIAGNOSIS — Z950 Presence of cardiac pacemaker: Secondary | ICD-10-CM | POA: Insufficient documentation

## 2021-05-29 DIAGNOSIS — Z79899 Other long term (current) drug therapy: Secondary | ICD-10-CM | POA: Diagnosis not present

## 2021-05-29 DIAGNOSIS — R42 Dizziness and giddiness: Secondary | ICD-10-CM

## 2021-05-29 LAB — RENAL FUNCTION PANEL
Albumin: 2.3 g/dL — ABNORMAL LOW (ref 3.5–5.0)
Anion gap: 18 — ABNORMAL HIGH (ref 5–15)
BUN: 38 mg/dL — ABNORMAL HIGH (ref 8–23)
CO2: 22 mmol/L (ref 22–32)
Calcium: 8.1 mg/dL — ABNORMAL LOW (ref 8.9–10.3)
Chloride: 93 mmol/L — ABNORMAL LOW (ref 98–111)
Creatinine, Ser: 9.69 mg/dL — ABNORMAL HIGH (ref 0.44–1.00)
GFR, Estimated: 4 mL/min — ABNORMAL LOW (ref >60)
Glucose, Bld: 139 mg/dL — ABNORMAL HIGH (ref 70–99)
Phosphorus: 5.4 mg/dL — ABNORMAL HIGH (ref 2.5–4.6)
Potassium: 5.7 mmol/L — ABNORMAL HIGH (ref 3.5–5.1)
Sodium: 133 mmol/L — ABNORMAL LOW (ref 135–145)

## 2021-05-29 LAB — CBC WITH DIFFERENTIAL/PLATELET
Abs Immature Granulocytes: 0.1 10*3/uL — ABNORMAL HIGH (ref 0.00–0.07)
Basophils Absolute: 0.1 10*3/uL (ref 0.0–0.1)
Basophils Relative: 1 %
Eosinophils Absolute: 0.1 10*3/uL (ref 0.0–0.5)
Eosinophils Relative: 1 %
HCT: 31.7 % — ABNORMAL LOW (ref 36.0–46.0)
Hemoglobin: 10.5 g/dL — ABNORMAL LOW (ref 12.0–15.0)
Immature Granulocytes: 1 %
Lymphocytes Relative: 13 %
Lymphs Abs: 1.4 10*3/uL (ref 0.7–4.0)
MCH: 32.8 pg (ref 26.0–34.0)
MCHC: 33.1 g/dL (ref 30.0–36.0)
MCV: 99.1 fL (ref 80.0–100.0)
Monocytes Absolute: 0.9 10*3/uL (ref 0.1–1.0)
Monocytes Relative: 9 %
Neutro Abs: 7.7 10*3/uL (ref 1.7–7.7)
Neutrophils Relative %: 75 %
Platelets: 200 10*3/uL (ref 150–400)
RBC: 3.2 MIL/uL — ABNORMAL LOW (ref 3.87–5.11)
RDW: 12.5 % (ref 11.5–15.5)
WBC: 10.2 10*3/uL (ref 4.0–10.5)
nRBC: 0 % (ref 0.0–0.2)

## 2021-05-29 LAB — HEPATIC FUNCTION PANEL
ALT: 18 U/L (ref 0–44)
AST: 45 U/L — ABNORMAL HIGH (ref 15–41)
Albumin: 2.3 g/dL — ABNORMAL LOW (ref 3.5–5.0)
Alkaline Phosphatase: 157 U/L — ABNORMAL HIGH (ref 38–126)
Bilirubin, Direct: 0.9 mg/dL — ABNORMAL HIGH (ref 0.0–0.2)
Indirect Bilirubin: 0.8 mg/dL (ref 0.3–0.9)
Total Bilirubin: 1.7 mg/dL — ABNORMAL HIGH (ref 0.3–1.2)
Total Protein: 5.7 g/dL — ABNORMAL LOW (ref 6.5–8.1)

## 2021-05-29 NOTE — ED Triage Notes (Signed)
Pt BIB [facility] from home c/o dizziness. Pt fell last Wednesday, hitting the back of her head, was not seen for fall. Pt does not take thinners. Pt endorses dizziness, head pain, and pain behind eyes since fall. Pt endorses SOB since Oct '21. Pt endorses N/V on Saturday, none since this episode. Pt is a peritoneal dialysis pt, getting tx nightly. Fistula to R arm. Pt has a hx of vertigo.   EMS VS- 146/98, HR 94 (paced), 98% RA, CBG 155

## 2021-05-29 NOTE — ED Notes (Signed)
Pt ambulatory with walker per baseline. Pt able to ambulate ~30 feet with no assistance. Pt endorses feeling short of breath while ambulating. Gait steady, even.

## 2021-05-29 NOTE — Telephone Encounter (Signed)
PFTs are largely normal which is reassuring. Mild DLCO reduction maybe related to chronic changes seen on CT scan in 2021.

## 2021-05-29 NOTE — Telephone Encounter (Signed)
Received a message from patient asking about the results from her PFT.   MH, can you please advise? Thanks!

## 2021-05-29 NOTE — ED Notes (Signed)
Provided pt with water.

## 2021-05-29 NOTE — ED Provider Notes (Signed)
[facility] EMERGENCY DEPARTMENT Provider Note   CSN: [id number] Arrival date & time: 05/29/21  2043     History Chief Complaint  Patient presents with   Dizziness    Erika Fry is a 75 y.o. female.  The history is provided by the patient and medical records.  Dizziness Erika Fry is a 75 y.o. female who presents to the Emergency Department complaining of dizziness. She presents the emergency department by EMS for evaluation of dizziness. She states that she had vertigo last Wednesday and felt straight backwards, striking her head. Her hairstylist did not noticed a hematoma or bump at that time. Since that episode she has been experiencing recurrent episodes of vertigo. On Sunday she developed occipital headache with associated nausea and vomiting. She is having ongoing intermittent episodes of vertigo. Her episode last Wednesday was constant and lasted throughout the day. No fevers, chest pain, abdominal pain, diarrhea. She does perform peritoneal dialysis daily. She does have a history of pacemaker placement. She does not have a history of hypertension.     Past Medical History:  Diagnosis Date   Arthritis    "knees, thumbs" (03/25/2018)   AV block, Mobitz 2 03/25/2018   CKD (chronic kidney disease), stage IV (HCC)    Complication of anesthesia    Reports had hard time waking up in the past   Diet-controlled diabetes mellitus ([place])    Encounter for care of pacemaker 09/07/2019   End stage renal failure on dialysis ([place])    T, Th, Sat [place]   GERD (gastroesophageal reflux disease)    Gout    "on daily RX" (03/25/2018)   Heart murmur    High cholesterol    Hypothyroidism    Iron deficiency anemia    "had to get an iron infusion"   Migraine    "used to have them growing up" (03/25/2018)   Presence of permanent cardiac pacemaker 03/25/2018    Patient Active Problem List   Diagnosis Date Noted   Acidosis 10/06/2020   Disorder of  lipoprotein metabolism, unspecified 10/06/2020   Liver disease, unspecified 10/06/2020   Moderate protein-calorie malnutrition ([place]) 10/06/2020   Other abnormal findings in urine 10/06/2020   Other dietary vitamin B12 deficiency anemia 10/06/2020   Other disorders of bilirubin metabolism 10/06/2020   Other disorders of electrolyte and fluid balance, not elsewhere classified 10/06/2020   Other disorders resulting from impaired renal tubular function 10/06/2020   Other long term (current) drug therapy 10/06/2020   Allergy, unspecified, initial encounter 08/21/2020   Anaphylactic shock, unspecified, initial encounter 08/21/2020   Nausea 05/18/2020   Acute respiratory disease due to COVID-19 virus 12/25/2019   Encounter for care of pacemaker 09/07/2019   Mild protein-calorie malnutrition ([place]) 07/01/2019   Coagulation defect, unspecified ([place]) 06/29/2019   Secondary hyperparathyroidism of renal origin ([place]) 06/29/2019   ESRD (end stage renal disease) on dialysis ([place])    Pain in both lower extremities    Hypomagnesemia 06/11/2019   DNR (do not resuscitate) discussion    Cystitis    Acute on chronic diastolic CHF (congestive heart failure) ([place]) 06/10/2019   Chronic diastolic CHF (congestive heart failure) ([place]) 06/09/2019   Cellulitis of lower extremity 06/09/2019   Bladder pain    Hyponatremia    Acute on chronic anemia    Diabetes mellitus type 2 in obese (HCC)    Chronic kidney disease    Drug induced constipation    Neurogenic bladder    Weakness generalized  04/21/2019   Acquired hydronephrosis with ureteropelvic junction (UPJ) obstruction    Pain in joint involving pelvic region and thigh    Dysuria    Palliative care by specialist    AKI (acute kidney injury) ([place]) 04/10/2019   Hydronephrosis 04/09/2019   Interstitial cystitis 04/09/2019   Bilateral leg edema 02/25/2019   Chronic heart failure with preserved ejection fraction ([place]) 02/24/2019   Pacemaker Medtronic Azure XT  DR MRI Dual Chamber Pacemaker 03/25/2018 02/24/2019   ARF (acute renal failure) ([place]) 02/10/2019   Peripheral edema    Fever 01/17/2019   Acute lower UTI 01/17/2019   Pressure injury of skin 01/17/2019   Gross hematuria 01/14/2019   Fluid retention    Generalized edema    Hypokalemia    Acute right-sided low back pain with right-sided sciatica    Acute blood loss anemia    Anemia of chronic disease    Thrombocytopenia (HCC)    Diabetes mellitus ([place])    Morbid obesity (HCC)    Hypoalbuminemia due to protein-calorie malnutrition (HCC)    Occult blood in stools    Gastric polyp    Lumbar discitis 05/17/2018   Blood loss anemia 05/12/2018   Hyperlipidemia associated with type 2 diabetes mellitus ([place]) 05/10/2018   Yeast UTI 04/30/2018   Chronic kidney disease (CKD), stage IV (severe) (HCC)    Diet-controlled diabetes mellitus ([place])    GERD (gastroesophageal reflux disease)    Gout    High cholesterol    Hypothyroidism    Iron deficiency anemia    AV block, Mobitz 2 03/25/2018   Mobitz type 2 second degree AV block 03/20/2018   Exertional dyspnea 03/19/2018   Acute pain of left knee 07/16/2016   Right wrist pain 07/16/2016    Past Surgical History:  Procedure Laterality Date   APPENDECTOMY     AV FISTULA PLACEMENT Right 06/29/2019   Procedure: ARTERIOVENOUS (AV) FISTULA CREATION RIGHT UPPER  ARM;  Surgeon: Waynetta Sandy, MD;  Location: [place];  Service: Vascular;  Laterality: Right;   [place] Right 09/08/2019   Procedure: BASILIC VEIN TRANSPOSITION RIGHT SECOND STAGE;  Surgeon: Waynetta Sandy, MD;  Location: [place];  Service: Vascular;  Laterality: Right;   BIOPSY  05/19/2018   Procedure: BIOPSY;  Surgeon: Ladene Artist, MD;  Location: [facility] ENDOSCOPY;  Service: Endoscopy;;   CYSTOSCOPY W/ RETROGRADES Bilateral 01/14/2019   Procedure: CYSTOSCOPY WITH RETROGRADE PYELOGRAM BILATERAL HYDRODISTENTION;  Surgeon: Ardis Hughs, MD;   Location: [facility];  Service: Urology;  Laterality: Bilateral;   ESOPHAGOGASTRODUODENOSCOPY N/A 05/19/2018   Procedure: ESOPHAGOGASTRODUODENOSCOPY (EGD);  Surgeon: Ladene Artist, MD;  Location: [facility] ENDOSCOPY;  Service: Endoscopy;  Laterality: N/A;   ESOPHAGOGASTRODUODENOSCOPY (EGD) WITH PROPOFOL N/A 01/28/2019   Procedure: ESOPHAGOGASTRODUODENOSCOPY (EGD) WITH PROPOFOL;  Surgeon: Rush Landmark Telford Nab., MD;  Location: [place];  Service: Gastroenterology;  Laterality: N/A;   INSERT / REPLACE / REMOVE PACEMAKER  03/25/2018   IR FLUORO GUIDE CV LINE RIGHT  05/18/2018   IR FLUORO GUIDE CV LINE RIGHT  06/24/2019   IR LUMBAR DISC ASPIRATION W/IMG GUIDE  05/15/2018   IR NEPHROSTOMY EXCHANGE RIGHT  05/28/2019   IR NEPHROSTOMY EXCHANGE RIGHT  07/19/2019   IR NEPHROSTOMY PLACEMENT RIGHT  04/09/2019   IR REMOVAL TUN CV CATH W/O FL  07/23/2018   IR US GUIDE VASC ACCESS RIGHT  05/18/2018   IR US GUIDE VASC ACCESS RIGHT  06/24/2019   KNEE ARTHROSCOPY Bilateral    PACEMAKER IMPLANT N/A 03/25/2018  Procedure: PACEMAKER IMPLANT;  Surgeon: Evans Lance, MD;  Location: [place] CV LAB;  Service: Cardiovascular;  Laterality: N/A;   PACEMAKER PLACEMENT Left 03/2018   POLYPECTOMY  01/28/2019   Procedure: POLYPECTOMY;  Surgeon: Mansouraty, Telford Nab., MD;  Location: [place];  Service: Gastroenterology;;   RIGHT HEART CATH N/A 03/20/2018   Procedure: RIGHT HEART CATH;  Surgeon: Nigel Mormon, MD;  Location: [place] CV LAB;  Service: Cardiovascular;  Laterality: N/A;   TEE WITHOUT CARDIOVERSION N/A 03/20/2018   Procedure: TRANSESOPHAGEAL ECHOCARDIOGRAM (TEE);  Surgeon: Nigel Mormon, MD;  Location: [facility] ENDOSCOPY;  Service: Cardiovascular;  Laterality: N/A;   TONSILLECTOMY     URETEROSCOPY WITH HOLMIUM LASER LITHOTRIPSY Right 01/14/2019   Procedure: URETEROSCOPY WITH HOLMIUM LASER LITHOTRIPSY BLADDER BIOPSIES RIGHT STENT PLACEMENT;  Surgeon: Ardis Hughs, MD;  Location: [facility];  Service:  Urology;  Laterality: Right;     OB History   No obstetric history on file.     Family History  Problem Relation Age of Onset   Hypertension Mother    Diabetes Mellitus II Father    Heart disease Father    Heart attack Father    Gastric cancer Brother    Diabetes Mellitus II Brother    Stomach cancer Brother     Social History   Tobacco Use   Smoking status: Never   Smokeless tobacco: Never  Vaping Use   Vaping Use: Never used  Substance Use Topics   Alcohol use: Not Currently   Drug use: Never    Home Medications Prior to Admission medications   Medication Sig Start Date End Date Taking? Authorizing Provider  acetaminophen (TYLENOL) 325 MG tablet Take 650 mg by mouth every 6 (six) hours as needed.    [provider]  albuterol (VENTOLIN HFA) 108 (90 Base) MCG/ACT inhaler INHALE 2 PUFFS BY MOUTH EVERY 4 TO 6 HOURS AS NEEDED FOR WHEEZING, SEE ADMIN INSTRUCTIONS 02/12/21   Adrian Prows, MD  B Complex-C-Zn-Folic Acid (DIALYVITE 532 WITH ZINC) 0.8 MG TABS Take 1 tablet by mouth daily.  07/01/19   [provider]  Biotin 10000 MCG TABS Take 10,000 mcg by mouth daily with breakfast.    [provider]  calcitRIOL (ROCALTROL) 0.5 MCG capsule Take 0.5 mcg by mouth daily.    [provider]  cetirizine (ZYRTEC) 10 MG tablet Take 10 mg by mouth at bedtime.     [provider]  Cholecalciferol (VITAMIN D3) 50 MCG (2000 UT) TABS Take 2,000 mg by mouth daily with breakfast.    [provider]  cinacalcet (SENSIPAR) 60 MG tablet Take 60 mg by mouth 2 (two) times a week. Mon friday    [provider]  ferric citrate (AURYXIA) 1 GM 210 MG(Fe) tablet Take 420 mg by mouth 3 (three) times daily with meals.     [provider]  fluticasone (FLONASE) 50 MCG/ACT nasal spray Place 2 sprays into both nostrils daily as needed for allergies or rhinitis.    [provider]  HYDROcodone-acetaminophen (NORCO/VICODIN) 5-325 MG  tablet Take 1 tablet by mouth 6 (six) times daily. 12/13/19   [provider]  levothyroxine (SYNTHROID) 75 MCG tablet Take 1 tablet (75 mcg total) by mouth daily before breakfast. 04/30/19   Love, Ivan Anchors, PA-C  omeprazole (PRILOSEC) 20 MG capsule TAKE 1 CAPSULE BY MOUTH ONCE DAILY BEFORE BREAKFAST 05/17/21   [place]antwell, Celeste C, PA-C  senna (SENOKOT) 8.6 MG TABS tablet Take 1 tablet by mouth  at bedtime.    [provider]  simvastatin (ZOCOR) 40 MG tablet Take 1 tablet (40 mg total) by mouth at bedtime. 04/21/19   Dessa Phi, DO    Allergies    Baclofen, Tape, Other, Codeine, Dextromethorphan-guaifenesin, and Latex  Review of Systems   Review of Systems  Neurological:  Positive for dizziness.  All other systems reviewed and are negative.  Physical Exam Updated Vital Signs BP 94/60   Pulse 81   Temp 99.1 F (37.3 C)   Resp 20   Ht $R'5\' 5"'Lq$  (1.651 m)   Wt 81.6 kg   SpO2 98%   BMI 29.95 kg/m   Physical Exam Vitals and nursing note reviewed.  Constitutional:      Appearance: She is well-developed.  HENT:     Head: Normocephalic and atraumatic.  Cardiovascular:     Rate and Rhythm: Normal rate and regular rhythm.     Heart sounds: No murmur heard. Pulmonary:     Effort: Pulmonary effort is normal. No respiratory distress.     Breath sounds: Normal breath sounds.  Abdominal:     Palpations: Abdomen is soft.     Tenderness: There is no abdominal tenderness. There is no guarding or rebound.     Comments: PD catheter in place without surrounding erythema or tenderness  Musculoskeletal:        General: No swelling or tenderness.  Skin:    General: Skin is warm and dry.  Neurological:     Mental Status: She is alert and oriented to person, place, and time.     Comments: No asymmetry of facial movements. Visual fields grossly intact. Five out of five strength in all four extremities  Psychiatric:        Behavior: Behavior normal.    ED Results / Procedures  / Treatments   Labs (all labs ordered are listed, but only abnormal results are displayed) Labs Reviewed  CBC WITH DIFFERENTIAL/PLATELET - Abnormal; Notable for the following components:      Result Value   RBC 3.20 (*)    Hemoglobin 10.5 (*)    HCT 31.7 (*)    Abs Immature Granulocytes 0.10 (*)    All other components within normal limits  RENAL FUNCTION PANEL - Abnormal; Notable for the following components:   Sodium 133 (*)    Potassium 5.7 (*)    Chloride 93 (*)    Glucose, Bld 139 (*)    BUN 38 (*)    Creatinine, Ser 9.69 (*)    Calcium 8.1 (*)    Phosphorus 5.4 (*)    Albumin 2.3 (*)    GFR, Estimated 4 (*)    Anion gap 18 (*)    All other components within normal limits  HEPATIC FUNCTION PANEL - Abnormal; Notable for the following components:   Total Protein 5.7 (*)    Albumin 2.3 (*)    AST 45 (*)    Alkaline Phosphatase 157 (*)    Total Bilirubin 1.7 (*)    Bilirubin, Direct 0.9 (*)    All other components within normal limits  POTASSIUM    EKG EKG Interpretation  Date/Time:  Tuesday May 29 2021 20:51:45 EDT Ventricular Rate:  92 PR Interval:  193 QRS Duration: 149 QT Interval:  389 QTC Calculation: 482 R Axis:   -5 Text Interpretation: Sinus rhythm Left bundle branch block Confirmed by Quintella Reichert [phone]) on 05/30/2021 12:26:59 AM  Radiology CT Head Wo Contrast  Result Date: 05/29/2021 CLINICAL DATA:  Fall 1 week ago.  Ongoing pain, vertigo EXAM: CT HEAD WITHOUT CONTRAST TECHNIQUE: Contiguous axial images were obtained from the base of the skull through the vertex without intravenous contrast. COMPARISON:  01/16/2019 FINDINGS: Brain: No acute intracranial abnormality. Specifically, no hemorrhage, hydrocephalus, mass lesion, acute infarction, or significant intracranial injury. Diffuse cerebral atrophy. Vascular: No hyperdense vessel or unexpected calcification. Skull: No acute calvarial abnormality. Sinuses/Orbits: No acute findings Other: None  IMPRESSION: No acute intracranial abnormality. Electronically Signed   By: Rolm Baptise M.D.   On: 05/29/2021 22:15   CT Cervical Spine Wo Contrast  Result Date: 05/29/2021 CLINICAL DATA:  Fall, neck trauma EXAM: CT CERVICAL SPINE WITHOUT CONTRAST TECHNIQUE: Multidetector CT imaging of the cervical spine was performed without intravenous contrast. Multiplanar CT image reconstructions were also generated. COMPARISON:  None. FINDINGS: Alignment: No subluxation. Skull base and vertebrae: No acute fracture. No primary bone lesion or focal pathologic process. Soft tissues and spinal canal: No prevertebral fluid or swelling. No visible canal hematoma. Disc levels: Degenerative disc disease diffusely, most pronounced at C6-7. Diffuse moderate to advanced degenerative facet disease. Upper chest: No acute findings Other: None IMPRESSION: Degenerative disc and facet disease.  No acute bony abnormality. Electronically Signed   By: Rolm Baptise M.D.   On: 05/29/2021 22:16   DG Chest Port 1 View  Result Date: 05/29/2021 CLINICAL DATA:  Fall EXAM: PORTABLE CHEST 1 VIEW COMPARISON:  02/27/2021, chest CT 07/24/2020 FINDINGS: Left-sided pacing device as before. Stable lingular nodule. No focal airspace disease or effusion. Stable cardiomediastinal silhouette with aortic atherosclerosis. No pneumothorax. Multiple old right-sided rib fractures. Scarring at the left lung base. IMPRESSION: No active disease. Stable lingular nodule demonstrated to represent hamartoma on prior CT. Electronically Signed   By: Donavan Foil M.D.   On: 05/29/2021 21:33    Procedures Procedures   Medications Ordered in ED Medications - No data to display  ED Course  I have reviewed the triage vital signs and the nursing notes.  Pertinent labs & imaging results that were available during my care of the patient were reviewed by me and considered in my medical decision making (see chart for details).    MDM Rules/Calculators/A&P                          patient here for evaluation of persistent dizziness after having a fall one week ago. She is ESR D on peritoneal dialysis. She does have a low blood pressure at baseline around 51Z to 001 systolic. She is asymptomatic on ED evaluation. Imaging is negative for acute abnormality. Presentation is not consistent with CVA, dissection, subarachnoid hemorrhage. Labs with hyperkalemia, sample appears to be hemolyzed. Patient recently was seen by nephrology and was told she had hyperkalemia it was started on oral agent, which she was unable to take today. She is unsure what the potassium level was at the office. She plans to follow-up tomorrow in the office. She is asymptomatic at this time. Feels she is stable for discharge home with close return precautions.  Final Clinical Impression(s) / ED Diagnoses Final diagnoses:  Vertigo  Concussion without loss of consciousness, initial encounter  Hyperkalemia    Rx / DC Orders ED Discharge Orders     None        Quintella Reichert, MD 05/30/21 [phone]

## 2021-05-30 LAB — POTASSIUM: Potassium: 3.5 mmol/L (ref 3.5–5.1)

## 2021-06-21 ENCOUNTER — Ambulatory Visit: Payer: Self-pay | Admitting: Surgery

## 2021-06-21 DIAGNOSIS — H539 Unspecified visual disturbance: Secondary | ICD-10-CM

## 2021-06-21 DIAGNOSIS — M316 Other giant cell arteritis: Secondary | ICD-10-CM | POA: Diagnosis present

## 2021-06-22 ENCOUNTER — Encounter (HOSPITAL_COMMUNITY): Payer: Self-pay | Admitting: Surgery

## 2021-06-22 NOTE — Progress Notes (Addendum)
I spoke with Dr. Ambrose Pancoast to make him aware. Patient was added on this afternoon. I have been unsuccessful at reaching the patient to do a pre op via telephone. I have faxed ICD orders to Dr. Einar Gip.

## 2021-06-22 NOTE — Progress Notes (Addendum)
Unsuccessful attempts to reach patient information was received via chart review only  COVID Vaccine Completed: Yes Date COVID Vaccine completed:x2 Has received booster: COVID vaccine manufacturer: Pfizer     Date of COVID positive in last 90 days:  PCP - [name], MD Cardiologist - Alethia Berthold, PA-C last office visit note 05/10/2021  Chest x-ray - 05/29/2021 EKG - 05/29/2021 in epic Stress Test - 05/02/2021 in epic ECHO - 04/03/2021 in epic Cardiac Cath - greater than 2 years in epic Pacemaker/ICD device last checked:  Sleep Study -  CPAP -   Fasting Blood Sugar -  Checks Blood Sugar _____ times a day  Blood Thinner Instructions: Aspirin Instructions: Last Dose:  Activity level:  Unable to go up a flight of stairs without symptoms   Can go up a flight of stairs and activities of daily living without stopping and without symptoms   Able to exercise without symptoms     Anesthesia review: Pacemaker, CKD IV, ESRD dialysis, DM  Patient denies shortness of breath, fever, cough and chest pain at PAT appointment   Patient verbalized understanding of instructions that were given to them at the PAT appointment. Patient was also instructed that they will need to review over the PAT instructions again at home before surgery.

## 2021-06-24 NOTE — Anesthesia Preprocedure Evaluation (Addendum)
Anesthesia Evaluation  Patient identified by MRN, date of birth, ID band Patient awake    Reviewed: Allergy & Precautions, NPO status , Patient's Chart, lab work & pertinent test results  History of Anesthesia Complications Negative for: history of anesthetic complications  Airway Mallampati: II  TM Distance: >3 FB Neck ROM: Full    Dental  (+) Dental Advisory Given, Teeth Intact   Pulmonary neg pulmonary ROS,    Pulmonary exam normal        Cardiovascular +CHF  Normal cardiovascular exam+ dysrhythmias + pacemaker + Valvular Problems/Murmurs MR    '22 Myoperfusion - Nondiagnostic ECG stress. AV paced rhythm and no change with Lexiscan infusion. Myocardial perfusion is normal. LV is small in volume.  Overall LV systolic function is normal without regional wall motion abnormalities. Stress LV EF: 58%.  '22 TTE - Moderate concentric hypertrophy of the left ventricle. EF 61%. Grade I (impaired) diastolic dysfunction, normal LAP. Left atrial cavity is mildly dilated.  Increased velocity and max PG of 26 mmHg across LV/Aortic valve, seems to originate throughout mid cavity and LVOT. No significant valvular stenosis suspected.  Moderate (Grade III) mitral regurgitation. Moderate tricuspid regurgitation.      Neuro/Psych  Headaches,  Temporal arteritis  negative psych ROS   GI/Hepatic Neg liver ROS, GERD  Medicated and Controlled,  Endo/Other  diabetes, Well Controlled, Type 2Hypothyroidism   Renal/GU ESRF and DialysisRenal disease     Musculoskeletal  (+) Arthritis ,  Gout    Abdominal   Peds  Hematology negative hematology ROS (+)   Anesthesia Other Findings   Reproductive/Obstetrics                            Anesthesia Physical Anesthesia Plan  ASA: 3  Anesthesia Plan: General   Post-op Pain Management:    Induction: Intravenous  PONV Risk Score and Plan: 3 and Treatment may  vary due to age or medical condition, Ondansetron and Dexamethasone  Airway Management Planned: LMA  Additional Equipment: None  Intra-op Plan:   Post-operative Plan: Extubation in OR  Informed Consent: I have reviewed the patients History and Physical, chart, labs and discussed the procedure including the risks, benefits and alternatives for the proposed anesthesia with the patient or authorized representative who has indicated his/her understanding and acceptance.     Dental advisory given  Plan Discussed with: CRNA and Anesthesiologist  Anesthesia Plan Comments:        Anesthesia Quick Evaluation

## 2021-06-25 ENCOUNTER — Ambulatory Visit (HOSPITAL_COMMUNITY): Payer: Medicare Other | Admitting: Physician Assistant

## 2021-06-25 ENCOUNTER — Encounter (HOSPITAL_COMMUNITY): Payer: Self-pay | Admitting: Surgery

## 2021-06-25 ENCOUNTER — Ambulatory Visit (HOSPITAL_COMMUNITY)
Admission: RE | Admit: 2021-06-25 | Discharge: 2021-06-25 | Disposition: A | Discharge status: Home / Self Care | Payer: Medicare Other | Attending: Surgery | Admitting: Surgery

## 2021-06-25 ENCOUNTER — Encounter (HOSPITAL_COMMUNITY)
Admission: RE | Disposition: A | Discharge status: Home / Self Care | Payer: Self-pay | Source: Home / Self Care | Attending: Surgery

## 2021-06-25 DIAGNOSIS — Z885 Allergy status to narcotic agent status: Secondary | ICD-10-CM | POA: Diagnosis not present

## 2021-06-25 DIAGNOSIS — M316 Other giant cell arteritis: Secondary | ICD-10-CM | POA: Diagnosis present

## 2021-06-25 DIAGNOSIS — Z79899 Other long term (current) drug therapy: Secondary | ICD-10-CM | POA: Insufficient documentation

## 2021-06-25 DIAGNOSIS — Z91040 Latex allergy status: Secondary | ICD-10-CM | POA: Insufficient documentation

## 2021-06-25 DIAGNOSIS — H538 Other visual disturbances: Secondary | ICD-10-CM | POA: Insufficient documentation

## 2021-06-25 DIAGNOSIS — Z8616 Personal history of COVID-19: Secondary | ICD-10-CM | POA: Diagnosis not present

## 2021-06-25 DIAGNOSIS — Z950 Presence of cardiac pacemaker: Secondary | ICD-10-CM | POA: Diagnosis not present

## 2021-06-25 DIAGNOSIS — Z888 Allergy status to other drugs, medicaments and biological substances status: Secondary | ICD-10-CM | POA: Diagnosis not present

## 2021-06-25 DIAGNOSIS — H539 Unspecified visual disturbance: Secondary | ICD-10-CM

## 2021-06-25 DIAGNOSIS — R42 Dizziness and giddiness: Secondary | ICD-10-CM | POA: Insufficient documentation

## 2021-06-25 DIAGNOSIS — Z79890 Hormone replacement therapy (postmenopausal): Secondary | ICD-10-CM | POA: Insufficient documentation

## 2021-06-25 DIAGNOSIS — Z6832 Body mass index (BMI) 32.0-32.9, adult: Secondary | ICD-10-CM | POA: Diagnosis not present

## 2021-06-25 HISTORY — PX: ARTERY BIOPSY: SHX891

## 2021-06-25 LAB — CBC
HCT: 32.1 % — ABNORMAL LOW (ref 36.0–46.0)
Hemoglobin: 11 g/dL — ABNORMAL LOW (ref 12.0–15.0)
MCH: 33 pg (ref 26.0–34.0)
MCHC: 34.3 g/dL (ref 30.0–36.0)
MCV: 96.4 fL (ref 80.0–100.0)
Platelets: 177 10*3/uL (ref 150–400)
RBC: 3.33 MIL/uL — ABNORMAL LOW (ref 3.87–5.11)
RDW: 14.5 % (ref 11.5–15.5)
WBC: 15.3 10*3/uL — ABNORMAL HIGH (ref 4.0–10.5)
nRBC: 0 % (ref 0.0–0.2)

## 2021-06-25 LAB — BASIC METABOLIC PANEL
Anion gap: 20 — ABNORMAL HIGH (ref 5–15)
BUN: 60 mg/dL — ABNORMAL HIGH (ref 8–23)
CO2: 21 mmol/L — ABNORMAL LOW (ref 22–32)
Calcium: 9.8 mg/dL (ref 8.9–10.3)
Chloride: 95 mmol/L — ABNORMAL LOW (ref 98–111)
Creatinine, Ser: 7.32 mg/dL — ABNORMAL HIGH (ref 0.44–1.00)
GFR, Estimated: 5 mL/min — ABNORMAL LOW (ref >60)
Glucose, Bld: 148 mg/dL — ABNORMAL HIGH (ref 70–99)
Potassium: 3.4 mmol/L — ABNORMAL LOW (ref 3.5–5.1)
Sodium: 136 mmol/L (ref 135–145)

## 2021-06-25 LAB — GLUCOSE, CAPILLARY: Glucose-Capillary: 147 mg/dL — ABNORMAL HIGH (ref 70–99)

## 2021-06-25 SURGERY — BIOPSY TEMPORAL ARTERY
Anesthesia: General | Site: Face | Laterality: Right

## 2021-06-25 MED ORDER — BUPIVACAINE-EPINEPHRINE (PF) 0.25% -1:200000 IJ SOLN
INTRAMUSCULAR | Status: DC | PRN
  Administered 2021-06-25: 7 mL

## 2021-06-25 MED ORDER — ORAL CARE MOUTH RINSE: 15.0000 mL | Freq: Once | OROMUCOSAL | Status: AC

## 2021-06-25 MED ORDER — SODIUM CHLORIDE 0.9 % IV SOLN: Freq: Once | INTRAVENOUS | Status: AC

## 2021-06-25 MED ORDER — PROPOFOL 10 MG/ML IV BOLUS
INTRAVENOUS | Status: DC | PRN
  Administered 2021-06-25: 140 mg via INTRAVENOUS

## 2021-06-25 MED ORDER — LACTATED RINGERS IV SOLN: INTRAVENOUS | Status: DC

## 2021-06-25 MED ORDER — DEXAMETHASONE SODIUM PHOSPHATE 10 MG/ML IJ SOLN
INTRAMUSCULAR | Status: AC
  Filled 2021-06-25: qty 1

## 2021-06-25 MED ORDER — DEXAMETHASONE SODIUM PHOSPHATE 10 MG/ML IJ SOLN
INTRAMUSCULAR | Status: DC | PRN
  Administered 2021-06-25: 4 mg via INTRAVENOUS

## 2021-06-25 MED ORDER — 0.9 % SODIUM CHLORIDE (POUR BTL) OPTIME
TOPICAL | Status: DC | PRN
  Administered 2021-06-25: 1000 mL

## 2021-06-25 MED ORDER — FENTANYL CITRATE (PF) 100 MCG/2ML IJ SOLN
INTRAMUSCULAR | Status: AC
  Filled 2021-06-25: qty 2

## 2021-06-25 MED ORDER — SODIUM CHLORIDE 0.9 % IV SOLN
2.0000 g | INTRAVENOUS | Status: AC
  Administered 2021-06-25: 2 g via INTRAVENOUS
  Filled 2021-06-25: qty 2

## 2021-06-25 MED ORDER — PROPOFOL 10 MG/ML IV BOLUS
INTRAVENOUS | Status: AC
  Filled 2021-06-25: qty 20

## 2021-06-25 MED ORDER — CHLORHEXIDINE GLUCONATE CLOTH 2 % EX PADS: 6.0000 | MEDICATED_PAD | Freq: Once | CUTANEOUS | Status: DC

## 2021-06-25 MED ORDER — ONDANSETRON HCL 4 MG/2ML IJ SOLN
INTRAMUSCULAR | Status: DC | PRN
  Administered 2021-06-25: 4 mg via INTRAVENOUS

## 2021-06-25 MED ORDER — LIDOCAINE 2% (20 MG/ML) 5 ML SYRINGE
INTRAMUSCULAR | Status: DC | PRN
  Administered 2021-06-25: 60 mg via INTRAVENOUS
  Administered 2021-06-25: 40 mg via INTRAVENOUS

## 2021-06-25 MED ORDER — BUPIVACAINE HCL 0.25 % IJ SOLN
INTRAMUSCULAR | Status: AC
  Filled 2021-06-25: qty 1

## 2021-06-25 MED ORDER — FENTANYL CITRATE (PF) 100 MCG/2ML IJ SOLN
INTRAMUSCULAR | Status: DC | PRN
  Administered 2021-06-25: 25 ug via INTRAVENOUS

## 2021-06-25 MED ORDER — LIDOCAINE 2% (20 MG/ML) 5 ML SYRINGE
INTRAMUSCULAR | Status: AC
  Filled 2021-06-25: qty 5

## 2021-06-25 MED ORDER — LACTATED RINGERS IV SOLN: INTRAVENOUS | Status: DC | PRN

## 2021-06-25 MED ORDER — CHLORHEXIDINE GLUCONATE 0.12 % MT SOLN
15.0000 mL | Freq: Once | OROMUCOSAL | Status: AC
  Administered 2021-06-25: 15 mL via OROMUCOSAL

## 2021-06-25 MED ORDER — ONDANSETRON HCL 4 MG/2ML IJ SOLN
INTRAMUSCULAR | Status: AC
  Filled 2021-06-25: qty 2

## 2021-06-25 SURGICAL SUPPLY — 34 items
ATTRACTOMAT 16X20 MAGNETIC DRP (DRAPES) ×2 IMPLANT
BAG COUNTER SPONGE SURGICOUNT (BAG) IMPLANT
BLADE SURG 15 STRL LF DISP TIS (BLADE) ×1 IMPLANT
BLADE SURG 15 STRL SS (BLADE) ×2
CHLORAPREP W/TINT 26 (MISCELLANEOUS) ×2 IMPLANT
COVER SURGICAL LIGHT HANDLE (MISCELLANEOUS) ×2 IMPLANT
DERMABOND ADVANCED (GAUZE/BANDAGES/DRESSINGS) ×1
DERMABOND ADVANCED .7 DNX12 (GAUZE/BANDAGES/DRESSINGS) ×1 IMPLANT
DRAPE LAPAROTOMY T 98X78 PEDS (DRAPES) ×2 IMPLANT
DRAPE UTILITY XL STRL (DRAPES) ×2 IMPLANT
ELECT REM PT RETURN 15FT ADLT (MISCELLANEOUS) ×2 IMPLANT
GAUZE 4X4 16PLY ~~LOC~~+RFID DBL (SPONGE) ×4 IMPLANT
GAUZE SPONGE 4X4 12PLY STRL (GAUZE/BANDAGES/DRESSINGS) ×2 IMPLANT
GLOVE SURG LTX SZ8 (GLOVE) ×2 IMPLANT
GOWN STRL REUS W/TWL LRG LVL3 (GOWN DISPOSABLE) ×2 IMPLANT
GOWN STRL REUS W/TWL XL LVL3 (GOWN DISPOSABLE) ×2 IMPLANT
HEMOSTAT SURGICEL 2X14 (HEMOSTASIS) IMPLANT
KIT BASIN OR (CUSTOM PROCEDURE TRAY) ×2 IMPLANT
KIT TURNOVER KIT A (KITS) ×2 IMPLANT
MARKER SKIN DUAL TIP RULER LAB (MISCELLANEOUS) ×2 IMPLANT
NEEDLE HYPO 22GX1.5 SAFETY (NEEDLE) ×2 IMPLANT
NS IRRIG 1000ML POUR BTL (IV SOLUTION) ×2 IMPLANT
SUT MNCRL AB 4-0 PS2 18 (SUTURE) ×2 IMPLANT
SUT SILK 2 0 (SUTURE) ×2
SUT SILK 2-0 18XBRD TIE 12 (SUTURE) ×1 IMPLANT
SUT SILK 3 0 (SUTURE) ×2
SUT SILK 3-0 18XBRD TIE 12 (SUTURE) ×1 IMPLANT
SUT VIC AB 3-0 SH 27 (SUTURE)
SUT VIC AB 3-0 SH 27XBRD (SUTURE) IMPLANT
SUT VIC AB 3-0 SH 8-18 (SUTURE) ×2 IMPLANT
SYR 10ML LL (SYRINGE) ×2 IMPLANT
TOWEL OR 17X26 10 PK STRL BLUE (TOWEL DISPOSABLE) ×2 IMPLANT
TOWEL OR NON WOVEN STRL DISP B (DISPOSABLE) ×2 IMPLANT
WATER STERILE IRR 1000ML POUR (IV SOLUTION) ×2 IMPLANT

## 2021-06-25 NOTE — Discharge Instructions (Addendum)
  [facility] -- DISCHARGE INSTRUCTIONS  REMINDER:   Carry a list of your medications and allergies with you at all times  Call your pharmacy at least 1 week in advance to refill prescriptions  Do not mix any prescribed pain medicine with alcohol  Do not drive any motor vehicles while taking pain medication  Take medications with food unless otherwise directed  Follow-up appointments (date to return to physician): Please call [phone] to confirm your follow up appointment with your surgeon.  Call your Surgeon if you have:  Temperature greater than 101.0  Persistent nausea and vomiting  Severe uncontrolled pain  Redness, tenderness, or signs of infection (pain, swelling, redness, odor or green/yellow discharge around the site)  Difficulty breathing, headache or visual disturbances  Hives  Persistent dizziness or light-headedness  Any other questions or concerns you may have after discharge  In an emergency, call 911 or go to an Emergency Department at a nearby hospital.  Diet: Begin with liquids, and if they are tolerated, resume your usual diet.  Avoid spicy, greasy or heavy foods.  If you have nausea or vomiting, go back to liquids.  If you cannot keep liquids down, call your doctor.  Avoid alcohol consumption while on prescription pain medications. Good nutrition promotes healing. Increase fiber and fluids.   ADDITIONAL INSTRUCTIONS: Leave Dermabond on incision for 7-10 days, then remove with Vaseline if needed.  Follow up at [place] office as needed.  [facility] Office: [phone]

## 2021-06-25 NOTE — Anesthesia Procedure Notes (Signed)
Procedure Name: LMA Insertion Date/Time: 06/25/2021 8:18 AM Performed by: Lind Covert, CRNA Pre-anesthesia Checklist: Patient identified, Emergency Drugs available, Suction available and Patient being monitored Patient Re-evaluated:Patient Re-evaluated prior to induction Oxygen Delivery Method: Circle system utilized Preoxygenation: Pre-oxygenation with 100% oxygen Induction Type: IV induction Ventilation: Mask ventilation without difficulty LMA: LMA inserted LMA Size: 4.0 Number of attempts: 1 Placement Confirmation: positive ETCO2 and breath sounds checked- equal and bilateral Tube secured with: Tape Dental Injury: Teeth and Oropharynx as per pre-operative assessment

## 2021-06-25 NOTE — H&P (Signed)
Progress Notes by Gomez Cleverly, MD at 06/21/2021  5:00 PM  Author: Gomez Cleverly, MD Service: -- Author Type: Physician  Filed: 06/21/2021  5:48 PM Encounter Date: 06/21/2021 Note Type: Progress Notes  Editor: Gomez Cleverly, MD (Physician)     [facility]   REFERRING PHYSICIAN:  Melissa Noon   PROVIDER:  Zakayla Martinec Charlotta Newton, MD   MRN: [id number] DOB: 1946-10-27 DATE OF ENCOUNTER: 06/21/2021   Subjective    Chief Complaint: temporal artery biopsy (Referral from Dr. Melissa Noon)       History of Present Illness:   Patient is referred by Dr. Melissa Noon for surgical evaluation for temporal artery biopsy.  Patient has a recent history of vertigo and a fall.  She has developed visual changes in the right eye.  She is referred for temporal artery biopsy to rule out temporal arteritis.   Patient does have a pacemaker.  She is not on anticoagulation.     Review of Systems: A complete review of systems was obtained from the patient.  I have reviewed this information and discussed as appropriate with the patient.  See HPI as well for other ROS.   Review of Systems  Constitutional: Negative.   HENT: Negative.   Eyes: Positive for blurred vision.  Respiratory: Negative.   Cardiovascular: Positive for leg swelling.  Gastrointestinal: Negative.   Genitourinary: Negative.   Musculoskeletal: Negative.   Skin: Negative.   Neurological: Positive for dizziness.  Endo/Heme/Allergies: Negative.   Psychiatric/Behavioral: Negative.         Medical History:     Past Medical History:  Diagnosis Date   Arthritis     Changes in vision 06/21/2021   Chronic kidney disease     Diabetes mellitus without complication (CMS-HCC)     GERD (gastroesophageal reflux disease)     Temporal arteritis syndrome (CMS-HCC) 06/21/2021         Patient Active Problem List  Diagnosis   Acidosis   Acquired hydronephrosis with ureteropelvic  junction (UPJ) obstruction   Blood loss anemia   Acute lower UTI   Chronic heart failure with preserved ejection fraction (CMS-HCC)   Acute pain of left knee   Acute respiratory disease due to COVID-19 virus   Acute right-sided low back pain with right-sided sciatica   AKI (acute kidney injury) (CMS-HCC)   Allergy, unspecified, initial encounter   Anaphylactic shock, unspecified, initial encounter   ARF (acute renal failure) (CMS-HCC)   Atrioventricular block, second degree   Benign essential HTN   Bilateral leg edema   Bladder pain   Cellulitis of lower extremity   Chest pain, unspecified   Chronic kidney disease, unspecified   Coagulation defect, unspecified (CMS-HCC)   Cystitis   Type 2 diabetes mellitus with other specified complication (CMS-HCC)   Diet-controlled diabetes mellitus (CMS-HCC)   Encounter for care of pacemaker   Drug induced constipation   ESRD (end stage renal disease) on dialysis (CMS-HCC)   Exertional dyspnea   Fever   Fluid retention   GERD (gastroesophageal reflux disease)   Generalized edema   Gout   Gross hematuria   High cholesterol   Hydronephrosis   Hypoalbuminemia due to protein-calorie malnutrition (CMS-HCC)   Hypokalemia   Hyponatremia   Hypomagnesemia   Hypothyroidism   Interstitial cystitis (chronic) without hematuria   Iron deficiency anemia   Liver disease, unspecified   Lumbar discitis   Moderate protein-calorie malnutrition (CMS-HCC)   Morbid obesity (CMS-HCC)  Nausea   Neurogenic bladder   Occult blood in stools   Other abnormal findings in urine   Other dietary vitamin B12 deficiency anemia   Other disorders of bilirubin metabolism   Other disorders of electrolyte and fluid balance, not elsewhere classified   Other disorders resulting from impaired renal tubular function   Other long term (current) drug therapy   Other secondary hypertension   Pain in both lower extremities   Pain in joint involving pelvic region and  thigh   Palliative care by specialist   Peripheral edema   Presence of permanent cardiac pacemaker   Pressure injury of skin   Right wrist pain   Secondary hyperparathyroidism of renal origin (CMS-HCC)   Thrombocytopenia (CMS-HCC)   Weakness generalized   Yeast UTI   Temporal arteritis syndrome (CMS-HCC)   Changes in vision           Past Surgical History:  Procedure Laterality Date   APPENDECTOMY       knee surgery N/A     TONSILLECTOMY N/A             Allergies  Allergen Reactions   Adhesive Tape-Silicones Other (See Comments)      SKIN WILL TEAR AND BRUISE EASILY!!   Baclofen Other (See Comments)      Somnolence. States she was comatose for 4 days Somnolence- PUT THE PATIENT INTO A COMA AND "ALMOST KILLED HER"     Codeine Other (See Comments) and Unknown      Pain and couldn't sleep Intense pain Pain and couldn't sleep Increases Pain and couldn't sleep Increased pain and couldn't sleep     Other Other (See Comments)      Patient has a high tolerance to antibiotics- has taken a lot of them during the course of her life Patient has a high tolerance to antibiotics- has taken a lot of them during the course of her life     Dextromethorphan-Guaifenesin Other (See Comments)      Unknown reaction Unknown reaction     Latex Rash      Certain briefs for incontinence break her out Certain briefs for incontinence break her out              Current Outpatient Medications on File Prior to Visit  Medication Sig Dispense Refill   albuterol (PROAIR HFA) 90 mcg/actuation inhaler 2 puff q 4-6 hours prn wheeze       ferric citrate (AURYXIA) 210 mg iron tablet take 420mg  by mouth three times daily       HYDROcodone-acetaminophen (NORCO) 5-325 mg tablet Take 1 tab po q4hrs prn moderate pain ICD g89.4       omeprazole (PRILOSEC) 20 MG DR capsule Take 1 tablet by mouth once daily       vitamin B complx-C-FA-zinc cit (DIALYVITE 800 WITH ZINC 15) 0.8-15 mg Tab Take one daily        acetaminophen (TYLENOL) 325 MG tablet Take by mouth       alpha lipoic acid-herbal 305 150 mg Cap Take by mouth once daily       biotin 1 mg Cap Take by mouth once daily       calcitRIOL (ROCALTROL) 0.5 MCG capsule Take by mouth       cetirizine (ZYRTEC) 10 MG tablet Take by mouth       cinacalcet (SENSIPAR) 60 MG tablet 1 tablet with food or after a meal       fluticasone propionate (FLONASE) 50 mcg/actuation  nasal spray Place into one nostril       levothyroxine (SYNTHROID) 75 MCG tablet TAKE 1 TABLET BY MOUTH ONCE DAILY ON AN EMPTY STOMACH WITH GLASS OF WATER       sennosides (SENOKOT) 8.6 mg tablet Take 1 tablet by mouth nightly       simvastatin (ZOCOR) 40 MG tablet Take 1 tablet by mouth once daily        No current facility-administered medications on file prior to visit.      History reviewed. No pertinent family history.    Social History       Tobacco Use  Smoking Status Never Smoker  Smokeless Tobacco Never Used      Social History        Socioeconomic History   Marital status: Widowed  Tobacco Use   Smoking status: Never Smoker   Smokeless tobacco: Never Used  Vaping Use   Vaping Use: Never used  Substance and Sexual Activity   Alcohol use: Never   Drug use: Never   Sexual activity: Defer      Objective:         Vitals:    06/21/21 1715  BP: 122/84  Pulse: 83  Temp: 36.5 C (97.7 F)  SpO2: 99%  Weight: 86.5 kg (190 lb 9.6 oz)  Height: 165.1 cm (5\' 5" )    Body mass index is 31.72 kg/m.   Physical Exam    GENERAL APPEARANCE Development: normal Nutritional status: normal Gross deformities: none   SKIN Rash, lesions, ulcers: none Induration, erythema: none Nodules: none palpable   EYES Conjunctiva and lids: normal Pupils: equal and reactive Iris: normal bilaterally   EARS, NOSE, MOUTH, THROAT External ears: no lesion or deformity External nose: no lesion or deformity Hearing: grossly normal Due to Covid-19 pandemic, patient is  wearing a mask.   NECK Symmetric: yes Trachea: midline Thyroid: no palpable nodules in the thyroid bed   CHEST Respiratory effort: normal Retraction or accessory muscle use: no Breath sounds: normal bilaterally Rales, rhonchi, wheeze: none   CARDIOVASCULAR Auscultation: regular rhythm, normal rate Murmurs: grade II/VI systolic murmur Pulses: radial pulse 2+ palpable Lower extremity edema: mild bilateral with compression stockings   MUSCULOSKELETAL Station and gait: normal Digits and nails: no clubbing or cyanosis Muscle strength: grossly normal all extremities Range of motion: grossly normal all extremities Deformity: none   LYMPHATIC Cervical: none palpable Supraclavicular: none palpable   PSYCHIATRIC Oriented to person, place, and time: yes Mood and affect: normal for situation Judgment and insight: appropriate for situation       Assessment and Plan:  Diagnoses and all orders for this visit:   Temporal arteritis syndrome (CMS-HCC)   Changes in vision       Patient is referred by her ophthalmologist for surgical evaluation for temporal artery biopsy.  Patient has experienced vertigo as well as visual changes in the right eye.  Temporal artery biopsy is requested to rule out temporal arteritis.  Patient is already started on steroids.   I discussed temporal artery biopsy.  I explained the location of the surgical incision.  This will be an outpatient procedure.  I discussed the size and location of the incision.  I discussed wound care following the procedure.  We discussed the urgency to proceed with biopsy given the fact that the patient is already started on steroids.  We will try to coordinate this with the operating room for Monday, June 25, 2021.  Patient understands and agrees to proceed.   +++++++++++++++++++++++++++++++++++++++++++++  You are being scheduled for surgery.   You should hear from our office's scheduling department within 3 business days  about the location, date, and time of surgery.   We try to make accommodations for patient's preferences in scheduling surgery, but sometimes the Operating Room's schedule or Dr. Gala Lewandowsky schedule prevents [place] from making those accommodations.   If you have not heard from our office within 3 business days, call the office and ask for Dr. Gala Lewandowsky nurse Claiborne Billings).   [facility] OFFICE: [phone]       [facility] Office: [phone]              Initial consult on 06/21/2021     Initial consult on 06/21/2021       Department  Name Address Phone [facility] [address] [place] [address] [phone]   Service Location  Name Address    [address]  [place] [address]     Discharge References/Attachments  None

## 2021-06-25 NOTE — Transfer of Care (Signed)
Immediate Anesthesia Transfer of Care Note  Patient: Erika Fry  Procedure(s) Performed: TEMPORAL ARTERY BIOPSY RIGHT (Right: Face)  Patient Location: PACU  Anesthesia Type:General  Level of Consciousness: awake  Airway & Oxygen Therapy: Patient Spontanous Breathing  Post-op Assessment: Report given to RN and Post -op Vital signs reviewed and stable  Post vital signs: Reviewed and stable  Last Vitals:  Vitals Value Taken Time  BP 107/78 06/25/21 0905  Temp    Pulse 100 06/25/21 0908  Resp 11 06/25/21 0908  SpO2 100 % 06/25/21 0908  Vitals shown include unvalidated device data.  Last Pain:  Vitals:   06/25/21 0714  TempSrc:   PainSc: 0-No pain         Complications: No notable events documented.

## 2021-06-25 NOTE — Op Note (Signed)
Operative Note  Pre-operative Diagnosis:  rule out temporal arteriitis  Post-operative Diagnosis:  same  Surgeon:  Armandina Gemma, MD  Assistant:  none   Procedure:  right temporal artery excisional biopsy  Anesthesia:  general (LMA)  Estimated Blood Loss:  minimal  Drains: none         Specimen: to pathology fresh  Indications:  Patient is referred by Dr. Melissa Noon for surgical evaluation for temporal artery biopsy.  Patient has a recent history of vertigo and a fall.  She has developed visual changes in the right eye.  She is referred for temporal artery biopsy to rule out temporal arteritis.  Procedure:  The patient was seen in the pre-op holding area. The risks, benefits, complications, treatment options, and expected outcomes were previously discussed with the patient. The patient agreed with the proposed plan and has signed the informed consent form.  The patient was brought to the operating room by the surgical team, identified as Erika Fry and the procedure verified. A "time out" was completed and the above information confirmed.  Following induction of general anesthesia, the patient was positioned and then prepped and draped in the usual aseptic fashion.  After ascertaining that an adequate level of anesthesia been achieved, the right temporal artery is identified using the Doppler.  A 2 cm incision is made anterior to the pinna of the right ear over the Doppler signal.  Dissection was carried through skin and subcutaneous tissues.  Artery is identified and confirmed with the Doppler.  The artery is dissected out over approximately 1.5 cm.  It is ligated proximally and distally with 3-0 silk ties.  The artery was then sharply excised and submitted fresh in saline to pathology for review.  Good hemostasis is achieved throughout the wound.  Local anesthetic is infiltrated circumferentially.  Subcutaneous tissues are closed with interrupted 3-0 Vicryl sutures.  Skin was  closed with a running 4-0 Monocryl subcuticular suture.  Dermabond is placed as dressing.  Patient is awakened from anesthesia and transported to the recovery room.  The patient tolerated the procedure well.   Armandina Gemma, MD [facility] Office: [phone]

## 2021-06-25 NOTE — Anesthesia Postprocedure Evaluation (Signed)
Anesthesia Post Note  Patient: Erika Fry  Procedure(s) Performed: TEMPORAL ARTERY BIOPSY RIGHT (Right: Face)     Patient location during evaluation: PACU Anesthesia Type: General Level of consciousness: awake and alert Pain management: pain level controlled Vital Signs Assessment: post-procedure vital signs reviewed and stable Respiratory status: spontaneous breathing, nonlabored ventilation and respiratory function stable Cardiovascular status: blood pressure returned to baseline and stable Postop Assessment: no apparent nausea or vomiting Anesthetic complications: no   No notable events documented.  Last Vitals:  Vitals:   06/25/21 0930 06/25/21 0941  BP: 127/62 138/80  Pulse: 92 98  Resp: 14 18  Temp:  36.6 C  SpO2: 98%     Last Pain:  Vitals:   06/25/21 0941  TempSrc:   PainSc: 0-No pain                 Audry Pili

## 2021-06-25 NOTE — Progress Notes (Signed)
Spoke to [facility] about patients pacemaker.  Order request was sent late Friday afternoon, patient was a Friday afternoon add-on.  Pacemaker orders not yet received.  Patients last remote check was May 6,2022.  Dr.Brock states we don't have to call [employer] rep. They will have magnet available.

## 2021-06-25 NOTE — Interval H&P Note (Signed)
History and Physical Interval Note:  06/25/2021 7:57 AM  Erika Fry  has presented today for surgery, with the diagnosis of TEMPORAL ARTERITIS SYNDROME.  The various methods of treatment have been discussed with the patient and family. After consideration of risks, benefits and other options for treatment, the patient has consented to    Procedure(s) with comments: TEMPORAL ARTERY BIOPSY RIGHT (Right) - LMA as a surgical intervention.    The patient's history has been reviewed, patient examined, no change in status, stable for surgery.  I have reviewed the patient's chart and labs.  Questions were answered to the patient's satisfaction.    Armandina Gemma, MD [facility] Office: [place]

## 2021-06-26 ENCOUNTER — Encounter (HOSPITAL_COMMUNITY): Payer: Self-pay | Admitting: Surgery

## 2021-06-26 LAB — SURGICAL PATHOLOGY

## 2021-06-26 NOTE — Progress Notes (Signed)
Path benign.  Negative for temporal arteriitis.  Claiborne Billings - please fax copy of report to Dr. Melissa Noon.  Armandina Gemma, MD [facility] Office: [phone]

## 2021-08-06 ENCOUNTER — Encounter (HOSPITAL_COMMUNITY): Payer: Self-pay | Admitting: Emergency Medicine

## 2021-08-06 ENCOUNTER — Emergency Department (HOSPITAL_COMMUNITY): Payer: Medicare Other

## 2021-08-06 ENCOUNTER — Inpatient Hospital Stay (HOSPITAL_COMMUNITY)
Admission: EM | Admit: 2021-08-06 | Discharge: 2021-08-17 | DRG: 853 | Disposition: A | Discharge status: Skilled Nursing Facility | Payer: Medicare Other | Attending: Internal Medicine | Admitting: Internal Medicine

## 2021-08-06 ENCOUNTER — Other Ambulatory Visit: Payer: Self-pay

## 2021-08-06 DIAGNOSIS — R571 Hypovolemic shock: Secondary | ICD-10-CM | POA: Diagnosis present

## 2021-08-06 DIAGNOSIS — E669 Obesity, unspecified: Secondary | ICD-10-CM | POA: Diagnosis present

## 2021-08-06 DIAGNOSIS — A419 Sepsis, unspecified organism: Secondary | ICD-10-CM | POA: Diagnosis not present

## 2021-08-06 DIAGNOSIS — N184 Chronic kidney disease, stage 4 (severe): Secondary | ICD-10-CM | POA: Diagnosis present

## 2021-08-06 DIAGNOSIS — Z950 Presence of cardiac pacemaker: Secondary | ICD-10-CM | POA: Diagnosis not present

## 2021-08-06 DIAGNOSIS — Z20822 Contact with and (suspected) exposure to covid-19: Secondary | ICD-10-CM | POA: Diagnosis not present

## 2021-08-06 DIAGNOSIS — L89326 Pressure-induced deep tissue damage of left buttock: Secondary | ICD-10-CM | POA: Diagnosis present

## 2021-08-06 DIAGNOSIS — E1165 Type 2 diabetes mellitus with hyperglycemia: Secondary | ICD-10-CM | POA: Diagnosis present

## 2021-08-06 DIAGNOSIS — E1169 Type 2 diabetes mellitus with other specified complication: Secondary | ICD-10-CM | POA: Diagnosis not present

## 2021-08-06 DIAGNOSIS — I083 Combined rheumatic disorders of mitral, aortic and tricuspid valves: Secondary | ICD-10-CM | POA: Diagnosis present

## 2021-08-06 DIAGNOSIS — Z79899 Other long term (current) drug therapy: Secondary | ICD-10-CM

## 2021-08-06 DIAGNOSIS — R6521 Severe sepsis with septic shock: Secondary | ICD-10-CM | POA: Diagnosis not present

## 2021-08-06 DIAGNOSIS — N186 End stage renal disease: Secondary | ICD-10-CM | POA: Diagnosis not present

## 2021-08-06 DIAGNOSIS — R1010 Upper abdominal pain, unspecified: Secondary | ICD-10-CM | POA: Diagnosis not present

## 2021-08-06 DIAGNOSIS — I872 Venous insufficiency (chronic) (peripheral): Secondary | ICD-10-CM | POA: Diagnosis present

## 2021-08-06 DIAGNOSIS — J479 Bronchiectasis, uncomplicated: Secondary | ICD-10-CM | POA: Diagnosis present

## 2021-08-06 DIAGNOSIS — D631 Anemia in chronic kidney disease: Secondary | ICD-10-CM | POA: Diagnosis present

## 2021-08-06 DIAGNOSIS — N2581 Secondary hyperparathyroidism of renal origin: Secondary | ICD-10-CM | POA: Diagnosis present

## 2021-08-06 DIAGNOSIS — I5032 Chronic diastolic (congestive) heart failure: Secondary | ICD-10-CM | POA: Diagnosis not present

## 2021-08-06 DIAGNOSIS — E871 Hypo-osmolality and hyponatremia: Secondary | ICD-10-CM | POA: Diagnosis not present

## 2021-08-06 DIAGNOSIS — Z9049 Acquired absence of other specified parts of digestive tract: Secondary | ICD-10-CM

## 2021-08-06 DIAGNOSIS — Z91048 Other nonmedicinal substance allergy status: Secondary | ICD-10-CM

## 2021-08-06 DIAGNOSIS — E872 Acidosis, unspecified: Secondary | ICD-10-CM

## 2021-08-06 DIAGNOSIS — K219 Gastro-esophageal reflux disease without esophagitis: Secondary | ICD-10-CM | POA: Diagnosis present

## 2021-08-06 DIAGNOSIS — Z888 Allergy status to other drugs, medicaments and biological substances status: Secondary | ICD-10-CM

## 2021-08-06 DIAGNOSIS — Z8249 Family history of ischemic heart disease and other diseases of the circulatory system: Secondary | ICD-10-CM

## 2021-08-06 DIAGNOSIS — Z885 Allergy status to narcotic agent status: Secondary | ICD-10-CM

## 2021-08-06 DIAGNOSIS — E876 Hypokalemia: Secondary | ICD-10-CM | POA: Diagnosis present

## 2021-08-06 DIAGNOSIS — Z992 Dependence on renal dialysis: Secondary | ICD-10-CM

## 2021-08-06 DIAGNOSIS — I9589 Other hypotension: Secondary | ICD-10-CM | POA: Diagnosis not present

## 2021-08-06 DIAGNOSIS — I132 Hypertensive heart and chronic kidney disease with heart failure and with stage 5 chronic kidney disease, or end stage renal disease: Secondary | ICD-10-CM | POA: Diagnosis not present

## 2021-08-06 DIAGNOSIS — R609 Edema, unspecified: Secondary | ICD-10-CM

## 2021-08-06 DIAGNOSIS — Z8616 Personal history of COVID-19: Secondary | ICD-10-CM

## 2021-08-06 DIAGNOSIS — K829 Disease of gallbladder, unspecified: Secondary | ICD-10-CM

## 2021-08-06 DIAGNOSIS — E8809 Other disorders of plasma-protein metabolism, not elsewhere classified: Secondary | ICD-10-CM | POA: Diagnosis not present

## 2021-08-06 DIAGNOSIS — Q244 Congenital subaortic stenosis: Secondary | ICD-10-CM | POA: Diagnosis not present

## 2021-08-06 DIAGNOSIS — K8020 Calculus of gallbladder without cholecystitis without obstruction: Secondary | ICD-10-CM

## 2021-08-06 DIAGNOSIS — Z6833 Body mass index (BMI) 33.0-33.9, adult: Secondary | ICD-10-CM

## 2021-08-06 DIAGNOSIS — K810 Acute cholecystitis: Secondary | ICD-10-CM | POA: Diagnosis not present

## 2021-08-06 DIAGNOSIS — M8580 Other specified disorders of bone density and structure, unspecified site: Secondary | ICD-10-CM | POA: Diagnosis present

## 2021-08-06 DIAGNOSIS — N132 Hydronephrosis with renal and ureteral calculous obstruction: Secondary | ICD-10-CM | POA: Diagnosis present

## 2021-08-06 DIAGNOSIS — R532 Functional quadriplegia: Secondary | ICD-10-CM | POA: Diagnosis not present

## 2021-08-06 DIAGNOSIS — E7800 Pure hypercholesterolemia, unspecified: Secondary | ICD-10-CM | POA: Diagnosis present

## 2021-08-06 DIAGNOSIS — M109 Gout, unspecified: Secondary | ICD-10-CM | POA: Diagnosis present

## 2021-08-06 DIAGNOSIS — E039 Hypothyroidism, unspecified: Secondary | ICD-10-CM | POA: Diagnosis present

## 2021-08-06 DIAGNOSIS — D72829 Elevated white blood cell count, unspecified: Secondary | ICD-10-CM

## 2021-08-06 DIAGNOSIS — G894 Chronic pain syndrome: Secondary | ICD-10-CM | POA: Diagnosis present

## 2021-08-06 DIAGNOSIS — Z8701 Personal history of pneumonia (recurrent): Secondary | ICD-10-CM

## 2021-08-06 DIAGNOSIS — M1990 Unspecified osteoarthritis, unspecified site: Secondary | ICD-10-CM | POA: Diagnosis present

## 2021-08-06 DIAGNOSIS — Z833 Family history of diabetes mellitus: Secondary | ICD-10-CM

## 2021-08-06 DIAGNOSIS — Z7952 Long term (current) use of systemic steroids: Secondary | ICD-10-CM

## 2021-08-06 DIAGNOSIS — I959 Hypotension, unspecified: Secondary | ICD-10-CM | POA: Diagnosis present

## 2021-08-06 DIAGNOSIS — E1122 Type 2 diabetes mellitus with diabetic chronic kidney disease: Secondary | ICD-10-CM | POA: Diagnosis present

## 2021-08-06 DIAGNOSIS — R911 Solitary pulmonary nodule: Secondary | ICD-10-CM | POA: Diagnosis present

## 2021-08-06 DIAGNOSIS — Z7982 Long term (current) use of aspirin: Secondary | ICD-10-CM

## 2021-08-06 DIAGNOSIS — I441 Atrioventricular block, second degree: Secondary | ICD-10-CM | POA: Diagnosis present

## 2021-08-06 DIAGNOSIS — R1013 Epigastric pain: Secondary | ICD-10-CM

## 2021-08-06 LAB — CBC WITH DIFFERENTIAL/PLATELET
Abs Immature Granulocytes: 0.34 10*3/uL — ABNORMAL HIGH (ref 0.00–0.07)
Basophils Absolute: 0.1 10*3/uL (ref 0.0–0.1)
Basophils Relative: 1 %
Eosinophils Absolute: 0.2 10*3/uL (ref 0.0–0.5)
Eosinophils Relative: 1 %
HCT: 29.2 % — ABNORMAL LOW (ref 36.0–46.0)
Hemoglobin: 9.9 g/dL — ABNORMAL LOW (ref 12.0–15.0)
Immature Granulocytes: 2 %
Lymphocytes Relative: 14 %
Lymphs Abs: 2 10*3/uL (ref 0.7–4.0)
MCH: 32.8 pg (ref 26.0–34.0)
MCHC: 33.9 g/dL (ref 30.0–36.0)
MCV: 96.7 fL (ref 80.0–100.0)
Monocytes Absolute: 1.4 10*3/uL — ABNORMAL HIGH (ref 0.1–1.0)
Monocytes Relative: 10 %
Neutro Abs: 10.1 10*3/uL — ABNORMAL HIGH (ref 1.7–7.7)
Neutrophils Relative %: 72 %
Platelets: 131 10*3/uL — ABNORMAL LOW (ref 150–400)
RBC: 3.02 MIL/uL — ABNORMAL LOW (ref 3.87–5.11)
RDW: 13.4 % (ref 11.5–15.5)
WBC: 14.1 10*3/uL — ABNORMAL HIGH (ref 4.0–10.5)
nRBC: 0 % (ref 0.0–0.2)

## 2021-08-06 LAB — COMPREHENSIVE METABOLIC PANEL
ALT: 13 U/L (ref 0–44)
AST: 19 U/L (ref 15–41)
Albumin: 2.4 g/dL — ABNORMAL LOW (ref 3.5–5.0)
Alkaline Phosphatase: 126 U/L (ref 38–126)
Anion gap: 19 — ABNORMAL HIGH (ref 5–15)
BUN: 38 mg/dL — ABNORMAL HIGH (ref 8–23)
CO2: 21 mmol/L — ABNORMAL LOW (ref 22–32)
Calcium: 9.6 mg/dL (ref 8.9–10.3)
Chloride: 91 mmol/L — ABNORMAL LOW (ref 98–111)
Creatinine, Ser: 8.5 mg/dL — ABNORMAL HIGH (ref 0.44–1.00)
GFR, Estimated: 4 mL/min — ABNORMAL LOW (ref >60)
Glucose, Bld: 168 mg/dL — ABNORMAL HIGH (ref 70–99)
Potassium: 2.7 mmol/L — CL (ref 3.5–5.1)
Sodium: 131 mmol/L — ABNORMAL LOW (ref 135–145)
Total Bilirubin: 0.7 mg/dL (ref 0.3–1.2)
Total Protein: 6.1 g/dL — ABNORMAL LOW (ref 6.5–8.1)

## 2021-08-06 LAB — PROTIME-INR
INR: 1 (ref 0.8–1.2)
Prothrombin Time: 13.3 seconds (ref 11.4–15.2)

## 2021-08-06 LAB — APTT: aPTT: 29 seconds (ref 24–36)

## 2021-08-06 LAB — RESP PANEL BY RT-PCR (FLU A&B, COVID) ARPGX2
Influenza A by PCR: NEGATIVE
Influenza B by PCR: NEGATIVE
SARS Coronavirus 2 by RT PCR: NEGATIVE

## 2021-08-06 LAB — LIPASE, BLOOD: Lipase: 28 U/L (ref 11–51)

## 2021-08-06 LAB — LACTIC ACID, PLASMA
Lactic Acid, Venous: 3 mmol/L (ref 0.5–1.9)
Lactic Acid, Venous: 2.1 mmol/L (ref 0.5–1.9)

## 2021-08-06 LAB — TROPONIN I (HIGH SENSITIVITY)
Troponin I (High Sensitivity): 80 ng/L — ABNORMAL HIGH (ref <18)
Troponin I (High Sensitivity): 76 ng/L — ABNORMAL HIGH (ref <18)

## 2021-08-06 LAB — BRAIN NATRIURETIC PEPTIDE: B Natriuretic Peptide: 142.4 pg/mL — ABNORMAL HIGH (ref 0.0–100.0)

## 2021-08-06 MED ORDER — POLYETHYLENE GLYCOL 3350 17 G PO PACK
17.0000 g | PACK | Freq: Every day | ORAL | Status: DC | PRN
  Administered 2021-08-07: 17 g via ORAL
  Filled 2021-08-06: qty 1

## 2021-08-06 MED ORDER — SODIUM CHLORIDE 0.9 % IV SOLN: 2.0000 g | Freq: Once | INTRAVENOUS | Status: DC

## 2021-08-06 MED ORDER — HEPARIN SODIUM (PORCINE) 5000 UNIT/ML IJ SOLN
5000.0000 [IU] | Freq: Three times a day (TID) | INTRAMUSCULAR | Status: DC
  Administered 2021-08-06 – 2021-08-17 (×30): 5000 [IU] via SUBCUTANEOUS
  Filled 2021-08-06 (×28): qty 1

## 2021-08-06 MED ORDER — NOREPINEPHRINE 4 MG/250ML-% IV SOLN
0.0000 ug/min | INTRAVENOUS | Status: DC
  Administered 2021-08-06: 2 ug/min via INTRAVENOUS
  Administered 2021-08-07: 14 ug/min via INTRAVENOUS
  Filled 2021-08-06: qty 250

## 2021-08-06 MED ORDER — LACTATED RINGERS IV SOLN: INTRAVENOUS | Status: AC

## 2021-08-06 MED ORDER — SODIUM CHLORIDE 0.9 % IV BOLUS
500.0000 mL | Freq: Once | INTRAVENOUS | Status: AC
  Administered 2021-08-06: 500 mL via INTRAVENOUS

## 2021-08-06 MED ORDER — SODIUM CHLORIDE 0.9 % IV BOLUS
1000.0000 mL | Freq: Once | INTRAVENOUS | Status: AC
  Administered 2021-08-06: 1000 mL via INTRAVENOUS

## 2021-08-06 MED ORDER — VANCOMYCIN HCL 1750 MG/350ML IV SOLN
1750.0000 mg | Freq: Once | INTRAVENOUS | Status: AC
  Administered 2021-08-06: 1750 mg via INTRAVENOUS
  Filled 2021-08-06: qty 350

## 2021-08-06 MED ORDER — ACETAMINOPHEN 325 MG PO TABS
650.0000 mg | ORAL_TABLET | Freq: Once | ORAL | Status: AC
  Administered 2021-08-06: 650 mg via ORAL
  Filled 2021-08-06: qty 2

## 2021-08-06 MED ORDER — MAGNESIUM SULFATE 2 GM/50ML IV SOLN
2.0000 g | Freq: Once | INTRAVENOUS | Status: AC
  Administered 2021-08-07: 2 g via INTRAVENOUS
  Filled 2021-08-06: qty 50

## 2021-08-06 MED ORDER — SODIUM CHLORIDE 0.9 % IV SOLN
1.0000 g | INTRAVENOUS | Status: DC
  Filled 2021-08-06: qty 1

## 2021-08-06 MED ORDER — DOCUSATE SODIUM 100 MG PO CAPS
100.0000 mg | ORAL_CAPSULE | Freq: Two times a day (BID) | ORAL | Status: DC | PRN
  Administered 2021-08-07: 100 mg via ORAL
  Filled 2021-08-06: qty 1

## 2021-08-06 MED ORDER — VANCOMYCIN VARIABLE DOSE PER UNSTABLE RENAL FUNCTION (PHARMACIST DOSING): Status: DC

## 2021-08-06 MED ORDER — METRONIDAZOLE 500 MG/100ML IV SOLN
500.0000 mg | Freq: Once | INTRAVENOUS | Status: AC
  Administered 2021-08-06: 500 mg via INTRAVENOUS
  Filled 2021-08-06: qty 100

## 2021-08-06 MED ORDER — MIDODRINE HCL 5 MG PO TABS
10.0000 mg | ORAL_TABLET | Freq: Once | ORAL | Status: AC
  Administered 2021-08-06: 10 mg via ORAL
  Filled 2021-08-06: qty 2

## 2021-08-06 MED ORDER — SODIUM CHLORIDE 0.9 % IV BOLUS (SEPSIS)
500.0000 mL | Freq: Once | INTRAVENOUS | Status: AC
  Administered 2021-08-06: 500 mL via INTRAVENOUS

## 2021-08-06 MED ORDER — POTASSIUM CHLORIDE 10 MEQ/100ML IV SOLN
10.0000 meq | Freq: Once | INTRAVENOUS | Status: AC
  Administered 2021-08-06: 10 meq via INTRAVENOUS
  Filled 2021-08-06: qty 100

## 2021-08-06 NOTE — Progress Notes (Signed)
Pharmacy Antibiotic Note  Erika Fry is a 75 y.o. female admitted on 08/06/2021 with sepsis.  Pharmacy has been consulted for cefepime and vancomycin dosing.  Patient with a history of arthritis, AV block (Mobitz 2), DM, Pacemaker, ESRD, GERD, gout. Patient is doing PD at home. Patient presenting with hypotension and syncope.  WBC 15.3  Plan: Cefepime 1g q24h Vancomycin 1750 mg once - subsequent dosing to be determined via nephrology plan for dialysis F/u cultures and nephrology plan Trend fever, WBC De-escalate when appropriate  Height: 5\' 5"  (165.1 cm) Weight: 74.8 kg (165 lb) IBW/kg (Calculated) : 57  Temp (24hrs), Avg:97.6 F (36.4 C), Min:97.6 F (36.4 C), Max:97.6 F (36.4 C)  No results for input(s): WBC, CREATININE, LATICACIDVEN, VANCOTROUGH, VANCOPEAK, VANCORANDOM, GENTTROUGH, GENTPEAK, GENTRANDOM, TOBRATROUGH, TOBRAPEAK, TOBRARND, AMIKACINPEAK, AMIKACINTROU, AMIKACIN in the last 168 hours.  CrCl cannot be calculated (Patient's most recent lab result is older than the maximum 21 days allowed.).    Allergies  Allergen Reactions   Baclofen Other (See Comments)    Somnolence- PUT THE PATIENT INTO A COMA AND "ALMOST KILLED HER"    Tape Other (See Comments)    SKIN WILL TEAR AND BRUISE EASILY!!   Other Other (See Comments)    Patient has a high tolerance to antibiotics- has taken a lot of them during the course of her life   Codeine Other (See Comments)    Increased pain and couldn't sleep   Dextromethorphan-Guaifenesin Other (See Comments)    Unknown reaction   Antimicrobials this admission: vancomycin 9/5 >>  cefepime 9/5 >>   Microbiology results: Pending  Thank you for allowing pharmacy to be a part of this patient's care.  Lorelei Pont, PharmD, BCPS 08/06/2021 6:55 PM ED Clinical Pharmacist -  [phone]

## 2021-08-06 NOTE — ED Provider Notes (Signed)
[facility] Provider Note   CSN: [id number] Arrival date & time: 08/06/21  1703     History Chief Complaint  Patient presents with   Hypotension    Erika Fry is a 75 y.o. female.  HPI     75 year old female with a history of ESRD on peritoneal dialysis, diabetes, type II AV block with pacemaker in place, hyperlipidemia, recent normal temooral artery biopsy, who presents with concern for fatigue, abdominal pain, decreased appetite, syncopal events.  Patient reports that she was prescribed midodrine within the last month, typically has blood pressures 88 to low 53G systolic, however ran out of her midodrine about a week ago.  After this, she started to develop lightheadedness, fatigue, and has had several syncopal episodes including one today.  Reports that she does check her blood pressures twice a day as she needs to for her peritoneal dialysis, and that they have only been slightly lower than her baseline.  Reports that her baseline is low.  Reports she has had greater than 1 month of abdominal discomfort that worsens when she eats, reports its both in the left and right upper quadrants.  Reports nausea but no vomiting.  Denies fevers.  Denies cough.  Reports she has had some shortness of breath which is also been present over the last week.  Denies chest pain.  Denies diarrhea, black or bloody stools.  Reports she takes hydrocodone for pain.  Reports she has not been eating much over the last month because of the pain that she has with eating.  She only makes a few drops of urine if anything a day. No change inappearance of dialysate.  Past Medical History:  Diagnosis Date   Arthritis    "knees, thumbs" (03/25/2018)   AV block, Mobitz 2 03/25/2018   CKD (chronic kidney disease), stage IV (HCC)    Complication of anesthesia    Reports had hard time waking up in the past   Diet-controlled diabetes mellitus ([place])    Encounter for care of  pacemaker 09/07/2019   End stage renal failure on dialysis ([place])    T, Th, Sat [place]   GERD (gastroesophageal reflux disease)    Gout    "on daily RX" (03/25/2018)   Heart murmur    High cholesterol    History of COVID-19 12/2019   Hypothyroidism    Iron deficiency anemia    "had to get an iron infusion"   Migraine    "used to have them growing up" (03/25/2018)   Pneumonia 12/2019   with COVID   Presence of permanent cardiac pacemaker 03/25/2018   medtronic dual pacemaker: Dr Crissie Sickles    Patient Active Problem List   Diagnosis Date Noted   Septic shock ([place]) 08/06/2021   Hypotension 08/06/2021   Changes in vision 06/21/2021   Temporal arteritis syndrome ([place]) 06/21/2021   Acidosis 10/06/2020   Disorder of lipoprotein metabolism, unspecified 10/06/2020   Liver disease, unspecified 10/06/2020   Moderate protein-calorie malnutrition ([place]) 10/06/2020   Other abnormal findings in urine 10/06/2020   Other dietary vitamin B12 deficiency anemia 10/06/2020   Other disorders of bilirubin metabolism 10/06/2020   Other disorders of electrolyte and fluid balance, not elsewhere classified 10/06/2020   Other disorders resulting from impaired renal tubular function 10/06/2020   Other long term (current) drug therapy 10/06/2020   Allergy, unspecified, initial encounter 08/21/2020   Anaphylactic shock, unspecified, initial encounter 08/21/2020   Nausea 05/18/2020   Acute respiratory  disease due to COVID-19 virus 12/25/2019   Encounter for care of pacemaker 09/07/2019   Mild protein-calorie malnutrition ([place]) 07/01/2019   Coagulation defect, unspecified ([place]) 06/29/2019   Secondary hyperparathyroidism of renal origin ([place]) 06/29/2019   ESRD (end stage renal disease) on dialysis ([place])    Pain in both lower extremities    Hypomagnesemia 06/11/2019   DNR (do not resuscitate) discussion    Cystitis    Acute on chronic diastolic CHF (congestive heart failure) ([place]) 06/10/2019    Chronic diastolic CHF (congestive heart failure) ([place]) 06/09/2019   Cellulitis of lower extremity 06/09/2019   Bladder pain    Hyponatremia    Acute on chronic anemia    Diabetes mellitus type 2 in obese (HCC)    Chronic kidney disease    Drug induced constipation    Neurogenic bladder    Weakness generalized 04/21/2019   Acquired hydronephrosis with ureteropelvic junction (UPJ) obstruction    Pain in joint involving pelvic region and thigh    Dysuria    Palliative care by specialist    AKI (acute kidney injury) ([place]) 04/10/2019   Hydronephrosis 04/09/2019   Interstitial cystitis 04/09/2019   Bilateral leg edema 02/25/2019   Chronic heart failure with preserved ejection fraction ([place]) 02/24/2019   Pacemaker Medtronic Azure XT DR MRI Dual Chamber Pacemaker 03/25/2018 02/24/2019   ARF (acute renal failure) ([place]) 02/10/2019   Peripheral edema    Fever 01/17/2019   Acute lower UTI 01/17/2019   Pressure injury of skin 01/17/2019   Gross hematuria 01/14/2019   Fluid retention    Generalized edema    Hypokalemia    Acute right-sided low back pain with right-sided sciatica    Acute blood loss anemia    Anemia of chronic disease    Thrombocytopenia (HCC)    Diabetes mellitus ([place])    Morbid obesity ([place])    Hypoalbuminemia due to protein-calorie malnutrition ([place])    Occult blood in stools    Gastric polyp    Lumbar discitis 05/17/2018   Blood loss anemia 05/12/2018   Hyperlipidemia associated with type 2 diabetes mellitus ([place]) 05/10/2018   Yeast UTI 04/30/2018   Chronic kidney disease (CKD), stage IV (severe) (HCC)    Diet-controlled diabetes mellitus ([place])    GERD (gastroesophageal reflux disease)    Gout    High cholesterol    Hypothyroidism    Iron deficiency anemia    AV block, Mobitz 2 03/25/2018   Mobitz type 2 second degree AV block 03/20/2018   Exertional dyspnea 03/19/2018   Acute pain of left knee 07/16/2016   Right wrist pain 07/16/2016    Past Surgical  History:  Procedure Laterality Date   APPENDECTOMY     ARTERY BIOPSY Right 06/25/2021   Procedure: TEMPORAL ARTERY BIOPSY RIGHT;  Surgeon: Armandina Gemma, MD;  Location: [facility];  Service: General;  Laterality: Right;  LMA   AV FISTULA PLACEMENT Right 06/29/2019   Procedure: ARTERIOVENOUS (AV) FISTULA CREATION RIGHT UPPER  ARM;  Surgeon: Waynetta Sandy, MD;  Location: [facility];  Service: Vascular;  Laterality: Right;   BASCILIC VEIN TRANSPOSITION Right 09/08/2019   Procedure: BASILIC VEIN TRANSPOSITION RIGHT SECOND STAGE;  Surgeon: Waynetta Sandy, MD;  Location: [facility];  Service: Vascular;  Laterality: Right;   BIOPSY  05/19/2018   Procedure: BIOPSY;  Surgeon: Ladene Artist, MD;  Location: [facility];  Service: Endoscopy;;   CYSTOSCOPY W/ RETROGRADES Bilateral 01/14/2019   Procedure: CYSTOSCOPY WITH RETROGRADE PYELOGRAM BILATERAL HYDRODISTENTION;  Surgeon:  Ardis Hughs, MD;  Location: [facility];  Service: Urology;  Laterality: Bilateral;   ESOPHAGOGASTRODUODENOSCOPY N/A 05/19/2018   Procedure: ESOPHAGOGASTRODUODENOSCOPY (EGD);  Surgeon: Ladene Artist, MD;  Location[facility];  Service: Endoscopy;  Laterality: N/A;   ESOPHAGOGASTRODUODENOSCOPY (EGD) WITH PROPOFOL N/A 01/28/2019   Procedure: ESOPHAGOGASTRODUODENOSCOPY (EGD) WITH PROPOFOL;  Surgeon: Rush Landmark Telford Nab., MD;  Location: [facility];  Service: Gastroenterology;  Laterality: N/A;   INSERT / REPLACE / REMOVE PACEMAKER  03/25/2018   IR FLUORO GUIDE CV LINE RIGHT  05/18/2018   IR FLUORO GUIDE CV LINE RIGHT  06/24/2019   IR LUMBAR DISC ASPIRATION W/IMG GUIDE  05/15/2018   IR NEPHROSTOMY EXCHANGE RIGHT  05/28/2019   IR NEPHROSTOMY EXCHANGE RIGHT  07/19/2019   IR NEPHROSTOMY PLACEMENT RIGHT  04/09/2019   IR REMOVAL TUN CV CATH W/O FL  07/23/2018   IR US GUIDE VASC ACCESS RIGHT  05/18/2018   IR US GUIDE VASC ACCESS RIGHT  06/24/2019   KNEE ARTHROSCOPY Bilateral    PACEMAKER IMPLANT N/A 03/25/2018   Procedure:  PACEMAKER IMPLANT;  Surgeon: Evans Lance, MD;  Location: [facility];  Service: Cardiovascular;  Laterality: N/A;   PACEMAKER PLACEMENT Left 03/2018   POLYPECTOMY  01/28/2019   Procedure: POLYPECTOMY;  Surgeon: Mansouraty, Telford Nab., MD;  Location: [facility];  Service: Gastroenterology;;   RIGHT HEART CATH N/A 03/20/2018   Procedure: RIGHT HEART CATH;  Surgeon: Nigel Mormon, MD;  Location: [facility];  Service: Cardiovascular;  Laterality: N/A;   TEE WITHOUT CARDIOVERSION N/A 03/20/2018   Procedure: TRANSESOPHAGEAL ECHOCARDIOGRAM (TEE);  Surgeon: Nigel Mormon, MD;  Location: [facility];  Service: Cardiovascular;  Laterality: N/A;   TONSILLECTOMY     URETEROSCOPY WITH HOLMIUM LASER LITHOTRIPSY Right 01/14/2019   Procedure: URETEROSCOPY WITH HOLMIUM LASER LITHOTRIPSY BLADDER BIOPSIES RIGHT STENT PLACEMENT;  Surgeon: Ardis Hughs, MD;  Location: [facility];  Service: Urology;  Laterality: Right;     OB History   No obstetric history on file.     Family History  Problem Relation Age of Onset   Hypertension Mother    Diabetes Mellitus II Father    Heart disease Father    Heart attack Father    Gastric cancer Brother    Diabetes Mellitus II Brother    Stomach cancer Brother     Social History   Tobacco Use   Smoking status: Never   Smokeless tobacco: Never  Vaping Use   Vaping Use: Never used  Substance Use Topics   Alcohol use: Not Currently   Drug use: Never    Home Medications Prior to Admission medications   Medication Sig Start Date End Date Taking? Authorizing Provider  acetaminophen (TYLENOL) 325 MG tablet Take 650 mg by mouth every 6 (six) hours as needed for moderate pain or headache.   Yes [provider]  albuterol (VENTOLIN HFA) 108 (90 Base) MCG/ACT inhaler INHALE 2 PUFFS BY MOUTH EVERY 4 TO 6 HOURS AS NEEDED FOR WHEEZING, SEE ADMIN INSTRUCTIONS Patient taking differently: Inhale 2 puffs into the lungs every 4  (four) hours as needed for shortness of breath or wheezing. 02/12/21  Yes Adrian Prows, MD  B Complex-C-Zn-Folic Acid (DIALYVITE 627 WITH ZINC) 0.8 MG TABS Take 1 tablet by mouth daily.  07/01/19  Yes [provider]  Biotin 10000 MCG TABS Take 10,000 mcg by mouth daily with breakfast.   Yes [provider]  calcitRIOL (ROCALTROL) 0.5 MCG capsule Take 0.5 mcg by mouth  daily.   Yes [provider]  cetirizine (ZYRTEC) 10 MG tablet Take 10 mg by mouth at bedtime.    Yes [provider]  cinacalcet (SENSIPAR) 60 MG tablet Take 60 mg by mouth 2 (two) times a week. Mon friday   Yes [provider]  Cyanocobalamin (VITAMIN B12) 1000 MCG TBCR Take 1,000 mcg by mouth daily.   Yes [provider]  escitalopram (LEXAPRO) 5 MG tablet Take 5 mg by mouth daily. 07/25/21  Yes [provider]  ferric citrate (AURYXIA) 1 GM 210 MG(Fe) tablet Take 420 mg by mouth 3 (three) times daily with meals.    Yes [provider]  fluticasone (FLONASE) 50 MCG/ACT nasal spray Place 2 sprays into both nostrils daily as needed for allergies or rhinitis.   Yes [provider]  HYDROcodone-acetaminophen (NORCO/VICODIN) 5-325 MG tablet Take 1 tablet by mouth every 4 (four) hours as needed for moderate pain. 12/13/19  Yes [provider]  levothyroxine (SYNTHROID) 75 MCG tablet Take 1 tablet (75 mcg total) by mouth daily before breakfast. 04/30/19  Yes Love, Ivan Anchors, PA-C  Methoxy PEG-Epoetin Beta (MIRCERA IJ) Inject into the skin. 07/25/21  Yes [provider]  midodrine (PROAMATINE) 5 MG tablet Take 5 mg by mouth 2 (two) times daily. 07/26/21  Yes [provider]  omeprazole (PRILOSEC) 20 MG capsule TAKE 1 CAPSULE BY MOUTH ONCE DAILY BEFORE BREAKFAST Patient taking differently: Take 20 mg by mouth daily. 05/17/21  Yes Cantwell, Celeste C, PA-C  senna (SENOKOT) 8.6 MG TABS tablet Take 1 tablet by mouth at bedtime.   Yes [provider]  simvastatin (ZOCOR) 40 MG tablet Take 1 tablet (40 mg total) by mouth at bedtime. 04/21/19  Yes Dessa Phi, DO  fluticasone (FLOVENT HFA) 110 MCG/ACT inhaler Inhale 1 puff into the lungs 2 (two) times daily. Patient not taking: Reported on 08/06/2021 12/24/19   [provider]  prednisoLONE acetate (PRED FORTE) 1 % ophthalmic suspension Place 1 drop into both eyes 2 (two) times daily. Patient not taking: Reported on 08/07/2021 05/30/21   [provider]  predniSONE (DELTASONE) 20 MG tablet Take 40 mg by mouth daily with breakfast. Tapering dose Patient not taking: Reported on 08/07/2021    [provider]    Allergies    Baclofen, Tape, Other, Codeine, and Dextromethorphan-guaifenesin  Review of Systems   Review of Systems  Constitutional:  Positive for activity change, appetite change and fatigue. Negative for fever.  HENT:  Negative for congestion.   Eyes:  Negative for visual disturbance.  Respiratory:  Positive for shortness of breath. Negative for cough.   Cardiovascular:  Negative for chest pain.  Gastrointestinal:  Positive for abdominal pain and nausea. Negative for constipation, diarrhea and vomiting.  Genitourinary:  Negative for dysuria (does not make much urine).  Musculoskeletal:  Positive for arthralgias (chroinc) and back pain.  Skin:  Negative for rash.  Neurological:  Positive for syncope and light-headedness. Negative for headaches.   Physical Exam Updated Vital Signs BP (!) 113/58   Pulse 71   Temp 97.6 F (36.4 C) (Oral)   Resp 16   Ht 5\' 5"  (1.651 m)   Wt 74.8 kg   SpO2 100%   BMI 27.46 kg/m   Physical Exam Vitals and nursing note reviewed.  Constitutional:      General: She is not in acute distress.    Appearance: She is well-developed. She is not diaphoretic.  HENT:     Head:  Normocephalic and atraumatic.  Eyes:     Conjunctiva/sclera: Conjunctivae normal.  Cardiovascular:     Rate and Rhythm: Normal rate  and regular rhythm.     Heart sounds: Normal heart sounds. No murmur heard.   No friction rub. No gallop.  Pulmonary:     Effort: Pulmonary effort is normal. No respiratory distress.     Breath sounds: Normal breath sounds. No wheezing or rales.  Abdominal:     General: There is no distension.     Palpations: Abdomen is soft.     Tenderness: There is abdominal tenderness (epigastric, LUQ, RUQ, pos murphy). There is no guarding.  Musculoskeletal:        General: No tenderness.     Cervical back: Normal range of motion.  Skin:    General: Skin is warm and dry.     Findings: No erythema or rash.  Neurological:     Mental Status: She is alert and oriented to person, place, and time.    ED Results / Procedures / Treatments   Labs (all labs ordered are listed, but only abnormal results are displayed) Labs Reviewed  LACTIC ACID, PLASMA - Abnormal; Notable for the following components:      Result Value   Lactic Acid, Venous 3.0 (*)    All other components within normal limits  LACTIC ACID, PLASMA - Abnormal; Notable for the following components:   Lactic Acid, Venous 2.1 (*)    All other components within normal limits  COMPREHENSIVE METABOLIC PANEL - Abnormal; Notable for the following components:   Sodium 131 (*)    Potassium 2.7 (*)    Chloride 91 (*)    CO2 21 (*)    Glucose, Bld 168 (*)    BUN 38 (*)    Creatinine, Ser 8.50 (*)    Total Protein 6.1 (*)    Albumin 2.4 (*)    GFR, Estimated 4 (*)    Anion gap 19 (*)    All other components within normal limits  BRAIN NATRIURETIC PEPTIDE - Abnormal; Notable for the following components:   B Natriuretic Peptide 142.4 (*)    All other components within normal limits  CBC WITH DIFFERENTIAL/PLATELET - Abnormal; Notable for the following components:   WBC 14.1 (*)    RBC 3.02 (*)    Hemoglobin 9.9 (*)    HCT 29.2 (*)    Platelets 131 (*)    Neutro Abs 10.1 (*)    Monocytes Absolute 1.4 (*)    Abs Immature Granulocytes  0.34 (*)    All other components within normal limits  TROPONIN I (HIGH SENSITIVITY) - Abnormal; Notable for the following components:   Troponin I (High Sensitivity) 80 (*)    All other components within normal limits  TROPONIN I (HIGH SENSITIVITY) - Abnormal; Notable for the following components:   Troponin I (High Sensitivity) 76 (*)    All other components within normal limits  RESP PANEL BY RT-PCR (FLU A&B, COVID) ARPGX2  CULTURE, BLOOD (ROUTINE X 2)  CULTURE, BLOOD (ROUTINE X 2)  URINE CULTURE  BODY FLUID CULTURE W GRAM STAIN  PROTIME-INR  APTT  LIPASE, BLOOD  CBC WITH DIFFERENTIAL/PLATELET  URINALYSIS, ROUTINE W REFLEX MICROSCOPIC  MAGNESIUM  CORTISOL  TSH  BODY FLUID CELL COUNT WITH DIFFERENTIAL  CBC  CREATININE, SERUM  CBC  BASIC METABOLIC PANEL  MAGNESIUM  PHOSPHORUS  HEMOGLOBIN A1C    EKG EKG Interpretation  Date/Time:  Monday August 06 2021 17:13:50 EDT Ventricular Rate:  93 PR Interval:  189 QRS Duration: 153 QT Interval:  404 QTC Calculation: 503 R Axis:   -6 Text Interpretation: Atrial-sensed ventricular-paced rhythm No further analysis attempted due to paced rhythm No significant change since last tracing Confirmed by Gareth Morgan [phone]) on 08/06/2021 10:00:36 PM  Radiology CT ABDOMEN PELVIS WO CONTRAST  Result Date: 08/06/2021 CLINICAL DATA:  Acute abdominal pain. History of peritoneal dialysis. EXAM: CT ABDOMEN AND PELVIS WITHOUT CONTRAST TECHNIQUE: Multidetector CT imaging of the abdomen and pelvis was performed following the standard protocol without IV contrast. COMPARISON:  CT abdomen and pelvis 05/10/2019. FINDINGS: Lower chest: There is a stable 4 mm nodular density in the left lower lobe image 5/5. There is linear scarring or atelectasis in both lung bases. Cannot exclude nodular density in the left lower lung measuring 8 mm image 5/1. Leads are partially visualized in the heart. Hepatobiliary: There is gallbladder sludge versus small  layering stones within the gallbladder. There is no biliary ductal dilatation identified. The liver appears within normal limits without focal lesion allowing for lack of intravenous contrast. Pancreas: Unremarkable. No pancreatic ductal dilatation or surrounding inflammatory changes. Spleen: Normal in size without focal abnormality. Adrenals/Urinary Tract: The bilateral adrenal glands appear within normal limits. There is mild bilateral renal atrophy which has progressed compared to the prior study. There are nonobstructing left renal calculi measuring up to 4 mm. There is a subcentimeter fat containing lesion in the left kidney, unchanged, favored is angiomyolipoma. There is no left-sided hydronephrosis. The left ureter is within normal limits. There is moderate/severe right-sided hydronephrosis to the level of the ureteropelvic junction. There are numerous calculi within the right renal pelvis measuring up to 4 mm. There are additional punctate right renal calculi. No obstructing calculi are identified at the right UPJ or right ureter. The bladder is completely decompressed and not well evaluated. Stomach/Bowel: Stomach is within normal limits. Appendix is not seen. No evidence of bowel wall thickening, distention, or inflammatory changes. Vascular/Lymphatic: Aortic atherosclerosis. No enlarged abdominal or pelvic lymph nodes. Reproductive: Uterus and bilateral adnexa are unremarkable. Other: There is a small amount of free fluid in free air throughout the abdomen and pelvis, presumably from peritoneal dialysis. Peritoneal dialysis catheter is seen entering the anterior abdominal wall with distal tip of the catheter in the left pelvis. The visualized portion of the catheter appears intact. There is no focal abdominal wall hernia. Musculoskeletal: Degenerative changes affect the spine. No acute fractures are seen. IMPRESSION: 1. Small amount of free fluid and free air throughout the abdomen and pelvis, presumably  secondary to peritoneal dialysis. 2. Moderate severe right-sided hydronephrosis to the level of the ureteropelvic junction. No obstructing calculus identified. Obstruction may be related to stricture or mass at the UPJ. 3. Right renal pelvic calculi.  Bilateral renal calculi. 4. Cholelithiasis and/or gallbladder sludge. 5. Questionable 8 mm nodular density in the left lower lung, incompletely imaged. Follow-up nonemergent chest CT recommended. Electronically Signed   By: Ronney Asters M.D.   On: 08/06/2021 18:35   DG Chest Port 1 View  Result Date: 08/06/2021 CLINICAL DATA:  Sepsis protocol EXAM: PORTABLE CHEST 1 VIEW COMPARISON:  Portable exam 1818 hours compared to 05/29/2021 FINDINGS: LEFT subclavian sequential transvenous pacemaker leads project at RIGHT atrium and RIGHT ventricle. Enlargement of cardiac silhouette. Mediastinal contours and pulmonary vascularity normal. Atherosclerotic calcification aorta. Bronchitic changes without infiltrate, pleural effusion, or pneumothorax. Bones demineralized. IMPRESSION: Enlargement of cardiac silhouette post pacemaker. Bronchitic changes without acute infiltrate. Aortic Atherosclerosis (ICD10-I70.0). Electronically Signed  By: Lavonia Dana M.D.   On: 08/06/2021 18:32    Procedures .Critical Care  Date/Time: 08/07/2021 12:36 AM Performed by: Gareth Morgan, MD Authorized by: Gareth Morgan, MD   Critical care provider statement:    Critical care time (minutes):  60   Critical care was time spent personally by me on the following activities:  Discussions with consultants, evaluation of patient's response to treatment, examination of patient, ordering and performing treatments and interventions, ordering and review of laboratory studies, ordering and review of radiographic studies, pulse oximetry, re-evaluation of patient's condition, obtaining history from patient or surrogate and review of old charts   Medications Ordered in ED Medications  lactated  ringers infusion ( Intravenous New Bag/Given 08/06/21 1814)  ceFEPIme (MAXIPIME) 1 g in sodium chloride 0.9 % 100 mL IVPB (has no administration in time range)  vancomycin variable dose per unstable renal function (pharmacist dosing) (has no administration in time range)  norepinephrine (LEVOPHED) 4mg  in 217mL premix infusion (8 mcg/min Intravenous Rate/Dose Change 08/06/21 2354)  docusate sodium (COLACE) capsule 100 mg (has no administration in time range)  polyethylene glycol (MIRALAX / GLYCOLAX) packet 17 g (has no administration in time range)  heparin injection 5,000 Units (5,000 Units Subcutaneous Given 08/06/21 2349)  magnesium sulfate IVPB 2 g 50 mL (has no administration in time range)  pantoprazole (PROTONIX) injection 40 mg (has no administration in time range)  insulin aspart (novoLOG) injection 0-6 Units (has no administration in time range)  sodium chloride 0.9 % bolus 500 mL (0 mLs Intravenous Stopped 08/06/21 1927)  metroNIDAZOLE (FLAGYL) IVPB 500 mg (0 mg Intravenous Stopped 08/06/21 1927)  vancomycin (VANCOREADY) IVPB 1750 mg/350 mL (0 mg Intravenous Stopped 08/06/21 2054)  midodrine (PROAMATINE) tablet 10 mg (10 mg Oral Given 08/06/21 1815)  acetaminophen (TYLENOL) tablet 650 mg (650 mg Oral Given 08/06/21 1950)  sodium chloride 0.9 % bolus 500 mL (0 mLs Intravenous Stopped 08/06/21 2040)  sodium chloride 0.9 % bolus 500 mL (0 mLs Intravenous Stopped 08/06/21 2040)  sodium chloride 0.9 % bolus 1,000 mL (0 mLs Intravenous Stopped 08/06/21 2206)  sodium chloride 0.9 % bolus 1,000 mL (0 mLs Intravenous Stopped 08/06/21 2247)  potassium chloride 10 mEq in 100 mL IVPB (0 mEq Intravenous Stopped 08/06/21 2354)    ED Course  I have reviewed the triage vital signs and the nursing notes.  Pertinent labs & imaging results that were available during my care of the patient were reviewed by me and considered in my medical decision making (see chart for details).    MDM Rules/Calculators/A&P                             75 year old female with a history of ESRD on peritoneal dialysis, diabetes, type II AV block with pacemaker in place, hyperlipidemia, recent normal temooral artery biopsy, who presents with concern for fatigue, abdominal pain, decreased appetite, syncopal events.  Reports she has been out of her midodrine for the last week, and with EMS was found to have blood pressure 60 systolic.  Initial blood pressure improved with 500 cc with EMS and 500 cc in the emergency department to 90 systolic, however decreased again.  She was placed on oxygen by EMS, however this was able to be weaned off and her oxygen levels appear normal on room air.  Initially did not call code sepsis or give 30 cc/kg given history of peritoneal dialysis, concern for possible hypoxia, normal mentation, and  consideration for other etiologies of her hypotension such as lack of midodrine, decreased appetite/dehydration or medication effect.  There was a delay in the lab with lactic acid result not readily available.  History does not suggest GI bleed and hgb around baseline, BNP baseline, no sign of pneumonia/CHF, COVID 19 testing negative, Cr appropriate for dialysis, given K for hypokalemia.  Labs are significant for leukocytosis, hypokalemia, and lactic acidosis with a lactate of 3, which decreased to 2.1 on recheck.  Interrogated pacemaker for syncope, no acute findings, sycnope likely secondary to hypotension.  Given concern for abdominal tenderness and discomfort, CT abdomen pelvis was obtained.  Shows small amount of free fluid and free air throughout the abdomen consistent with patient receiving peritoneal dialysis.  She has right renal pelvic calculi and bilateral renal calculi, right-sided hydronephrosis related to a stricture at the UPJ, without obstructing calculus identified.  Patient may follow-up with urology as an outpatient regarding this finding, but I do not feel it correlates with current symptoms or concern for  source of infection and discussed this with Dr. Alyson Ingles of urology.     She does have cholelithiasis and/or gallbladder sludge, along with right upper quadrant tenderness on exam.  Overall, have lower suspicion for cholecystitis, however given leukocytosis and significant hypotension, consider this as possible infection source and consulted General Surgery Dr. Zenia Resides.  Also consider symptomatic cholelithiasis as etiology of patient's pain after eating, and decreased p.o. intake leading to dehydration and hypotension.  Blood cultures drawn and given empiric antibiotics for unknown source.  Consulted nephrology regarding patient's peritoneal dialysis status and also obtaining peritoneal fluid.  Suspect multifactorial etiology of hypotension related to baseline hypotension with dehydration, lack of midodrine, medications, and concern for sepsis.  Despite nearly 4L of fluid, she has continued hypotension. Ordered levophed gtt and consulted PCCM for admission, with consult from General Surgery, outpatient Urology follow up recommendation and Nephrology consult for peritoneal fluid.    Final Clinical Impression(s) / ED Diagnoses Final diagnoses:  Hypotension, unspecified hypotension type  Lactic acidosis  Leukocytosis, unspecified type  Calculus of gallbladder without cholecystitis without obstruction  Epigastric pain    Rx / DC Orders ED Discharge Orders     None        Gareth Morgan, MD 08/07/21 [phone]

## 2021-08-06 NOTE — H&P (Addendum)
NAME:  Erika Fry, MRN:  [id number], DOB:  1946-07-07, LOS: 0 ADMISSION DATE:  08/06/2021, CONSULTATION DATE:  08/06/21 REFERRING MD:  EDP , CHIEF COMPLAINT:  dizziness   History of Present Illness:  Erika Fry is a 75 y.o. with a past medical history of ESRD on peritoneal dialysis, AV block with pacemaker, diabetes, GERD, hyperlipidemia, hypothyroidism, arthritis who presented with a chief complaint of generalized weakness and dizziness.  States that she ran out of her midodrine for over a week secondary to shortages at the pharmacy.  She states she had a syncopal episode yesterday, this is not the first for her was seen for this in 05/2021 with negative head CT.  However she remained dizzy today with abdominal pain after eating and reduced appetite, so presented to the ED.   She was initially hypotensive to 64/51, this improved with 3.5L IVF but MAP remained <65, so peripheral Levophed was initiated. Further work-up revealed initial lactic acid of 3.0 which improved to 2.1 after fluids, potassium 2.7, bicarb 21, anion gap 19, troponin 80, normal bilirubin and LFTs, and white blood cell count of 14 K.  CT abdomen/pelvis with gallbladder stones and layering sludge without evidence of cholecystitis with moderate-severe right-sided hydronephrosis without obstructing calculus.  Of note patient has a known history of both gallbladder stones and current hydronephrosis following ureteral stent removal in 2020   Pertinent  Medical History   has a past medical history of Arthritis, AV block, Mobitz 2 (03/25/2018), CKD (chronic kidney disease), stage IV ([place]), Complication of anesthesia, Diet-controlled diabetes mellitus ([place]), Encounter for care of pacemaker (09/07/2019), End stage renal failure on dialysis [facility]), GERD (gastroesophageal reflux disease), Gout, Heart murmur, High cholesterol, History of COVID-19 (12/2019), Hypothyroidism, Iron deficiency anemia, Migraine, Pneumonia (12/2019), and  Presence of permanent cardiac pacemaker (03/25/2018).   Significant Hospital Events: Including procedures, antibiotic start and stop dates in addition to other pertinent events   9/5, admit to critical care for hypotension, peripheral Levophed initiated  Interim History / Subjective:  Awake and alert, no current abdominal pain  Objective   Blood pressure (!) 111/54, pulse 69, temperature 97.6 F (36.4 C), temperature source Oral, resp. rate 11, height 5\' 5"  (1.651 m), weight 74.8 kg, SpO2 100 %.        Intake/Output Summary (Last 24 hours) at 08/06/2021 2304 Last data filed at 08/06/2021 2247 Gross per 24 hour  Intake 3950 ml  Output --  Net 3950 ml   Filed Weights   08/06/21 1716  Weight: 74.8 kg   General: Well-nourished female, awake, alert, no acute distress HEENT: MM pink/moist Neuro: Alert and oriented, following commands, no focal deficits CV: s1s2 RRR, no m/r/g PULM: Clear bilaterally without rhonchi or wheezing, on room air with good oxygen saturations GI: soft, bsx4 active, no tenderness to palpation, no rebound tenderness Extremities: warm/dry, 1+ edema  Skin: no rashes or lesions  Resolved Hospital Problem list     Assessment & Plan:    Hypotension with dizziness and syncopal episode Lactic acidosis, possible septic shock Suspect her lightheadedness is associated with hypotension in the setting of decreased p.o. intake and abruptly stopping midodrine.  Question possible developing infection, she has lactic acidosis and leukocytosis, though white count improved from a month ago and has been afebrile.  No infiltrate on CXR, no URI symptoms P: -Admit to ICU, continue Levophed to maintain MAP> 65, may need central line if requires more than peripheral dosing -Continue broad-spectrum antibiotics for now, follow culture results -Received  3.5 L IV fluid in the ED -Recent temporal artery biopsy 06/2021 to rule out temporal artery arteritis and was on steroids, (Path  benign) -Check cortisol level -suspect her syncope is related to hypotension, consider MRI brain if does not improve with BP normalization   Postprandial abdominal pain, GERD, cholelithiasis History of chronic dysphagia, seen by GI  several months ago.  Esophagram and MBSS done 2021 was unremarkable.  History of bleeding gastric polyp that was resected and 01/2019, was to follow-up for EGD.   P: -Continue PPI - will need outpatient GI follow-up for EGD -Has a history of cholelithiasis, no evidence of acute cholecystitis on CT and both bili and LFTs are unremarkable, consider HIDA scan     ESRD on peritoneal dialysis -Nonemergent nephrology consult in the a.m. -Monitor electrolytes   Hypokalemia Initially 2.7, denies any issues with continual dialysis, suspect may be related to poor p.o. intake -Repleted in the ED, give 2 g magnesium and repeat metabolic panel in the a.m.  AGMA Suspect related to lactic acidosis, follow metabolic panel   Hypothyroidism -Check TSH and continue Synthroid  Best Practice (right click and "Reselect all SmartList Selections" daily)   Diet/type: Regular consistency (see orders) DVT prophylaxis: prophylactic heparin  GI prophylaxis: PPI Lines: N/A Foley:  N/A Code Status:  full code Last date of multidisciplinary goals of care discussion []   Labs   CBC: Recent Labs  Lab 08/06/21 1925  WBC 14.1*  NEUTROABS 10.1*  HGB 9.9*  HCT 29.2*  MCV 96.7  PLT 131*    Basic Metabolic Panel: Recent Labs  Lab 08/06/21 1725  NA 131*  K 2.7*  CL 91*  CO2 21*  GLUCOSE 168*  BUN 38*  CREATININE 8.50*  CALCIUM 9.6   GFR: Estimated Creatinine Clearance: 5.6 mL/min (A) (by C-G formula based on SCr of 8.5 mg/dL (H)). Recent Labs  Lab 08/06/21 1725 08/06/21 1925  WBC  --  14.1*  LATICACIDVEN 3.0* 2.1*    Liver Function Tests: Recent Labs  Lab 08/06/21 1725  AST 19  ALT 13  ALKPHOS 126  BILITOT 0.7  PROT 6.1*  ALBUMIN 2.4*   Recent  Labs  Lab 08/06/21 1725  LIPASE 28   No results for input(s): AMMONIA in the last 168 hours.  ABG    Component Value Date/Time   HCO3 28.3 (H) 03/20/2018 1441   TCO2 27 09/08/2019 0846   O2SAT 70.0 03/20/2018 1441     Coagulation Profile: Recent Labs  Lab 08/06/21 1725  INR 1.0    Cardiac Enzymes: No results for input(s): CKTOTAL, CKMB, CKMBINDEX, TROPONINI in the last 168 hours.  HbA1C: Hgb A1c MFr Bld  Date/Time Value Ref Range Status  04/30/2018 04:55 AM 6.3 (H) 4.8 - 5.6 % Final    Comment:    (NOTE) Pre diabetes:          5.7%-6.4% Diabetes:              >6.4% Glycemic control for   <7.0% adults with diabetes     CBG: No results for input(s): GLUCAP in the last 168 hours.  Review of Systems:   Review of Systems  Constitutional:  Positive for malaise/fatigue. Negative for chills, fever and weight loss.  HENT: Negative.    Eyes: Negative.   Cardiovascular: Negative.   Gastrointestinal:  Positive for abdominal pain, heartburn and nausea. Negative for diarrhea.  Neurological:  Positive for dizziness and weakness. Negative for focal weakness and headaches.    Past Medical  History:  She,  has a past medical history of Arthritis, AV block, Mobitz 2 (03/25/2018), CKD (chronic kidney disease), stage IV ([place]), Complication of anesthesia, Diet-controlled diabetes mellitus ([place]), Encounter for care of pacemaker (09/07/2019), End stage renal failure on dialysis [facility]), GERD (gastroesophageal reflux disease), Gout, Heart murmur, High cholesterol, History of COVID-19 (12/2019), Hypothyroidism, Iron deficiency anemia, Migraine, Pneumonia (12/2019), and Presence of permanent cardiac pacemaker (03/25/2018).   Surgical History:   Past Surgical History:  Procedure Laterality Date   APPENDECTOMY     ARTERY BIOPSY Right 06/25/2021   Procedure: TEMPORAL ARTERY BIOPSY RIGHT;  Surgeon: Armandina Gemma, MD;  Location: [facility];  Service: General;  Laterality: Right;  LMA   AV FISTULA  PLACEMENT Right 06/29/2019   Procedure: ARTERIOVENOUS (AV) FISTULA CREATION RIGHT UPPER  ARM;  Surgeon: Waynetta Sandy, MD;  Location: [place];  Service: Vascular;  Laterality: Right;   Vivian Right 09/08/2019   Procedure: BASILIC VEIN TRANSPOSITION RIGHT SECOND STAGE;  Surgeon: Waynetta Sandy, MD;  Location: [place];  Service: Vascular;  Laterality: Right;   BIOPSY  05/19/2018   Procedure: BIOPSY;  Surgeon: Ladene Artist, MD;  Location: [place];  Service: Endoscopy;;   CYSTOSCOPY W/ RETROGRADES Bilateral 01/14/2019   Procedure: CYSTOSCOPY WITH RETROGRADE PYELOGRAM BILATERAL HYDRODISTENTION;  Surgeon: Ardis Hughs, MD;  Location: [facility];  Service: Urology;  Laterality: Bilateral;   ESOPHAGOGASTRODUODENOSCOPY N/A 05/19/2018   Procedure: ESOPHAGOGASTRODUODENOSCOPY (EGD);  Surgeon: Ladene Artist, MD;  [facility];  Service: Endoscopy;  Laterality: N/A;   ESOPHAGOGASTRODUODENOSCOPY (EGD) WITH PROPOFOL N/A 01/28/2019   Procedure: ESOPHAGOGASTRODUODENOSCOPY (EGD) WITH PROPOFOL;  Surgeon: Rush Landmark Telford Nab., MD;  Location: [place];  Service: Gastroenterology;  Laterality: N/A;   INSERT / REPLACE / REMOVE PACEMAKER  03/25/2018   IR FLUORO GUIDE CV LINE RIGHT  05/18/2018   IR FLUORO GUIDE CV LINE RIGHT  06/24/2019   IR LUMBAR DISC ASPIRATION W/IMG GUIDE  05/15/2018   IR NEPHROSTOMY EXCHANGE RIGHT  05/28/2019   IR NEPHROSTOMY EXCHANGE RIGHT  07/19/2019   IR NEPHROSTOMY PLACEMENT RIGHT  04/09/2019   IR REMOVAL TUN CV CATH W/O FL  07/23/2018   IR US GUIDE VASC ACCESS RIGHT  05/18/2018   IR US GUIDE VASC ACCESS RIGHT  06/24/2019   KNEE ARTHROSCOPY Bilateral    PACEMAKER IMPLANT N/A 03/25/2018   Procedure: PACEMAKER IMPLANT;  Surgeon: Evans Lance, MD;  Location: [place] CV LAB;  Service: Cardiovascular;  Laterality: N/A;   PACEMAKER PLACEMENT Left 03/2018   POLYPECTOMY  01/28/2019   Procedure: POLYPECTOMY;  Surgeon: Mansouraty, Telford Nab., MD;  Location: [place];  Service: Gastroenterology;;   RIGHT HEART CATH N/A 03/20/2018   Procedure: RIGHT HEART CATH;  Surgeon: Nigel Mormon, MD;  Location: [place] CV LAB;  Service: Cardiovascular;  Laterality: N/A;   TEE WITHOUT CARDIOVERSION N/A 03/20/2018   Procedure: TRANSESOPHAGEAL ECHOCARDIOGRAM (TEE);  Surgeon: Nigel Mormon, MD;  Location: [facility] ENDOSCOPY;  Service: Cardiovascular;  Laterality: N/A;   TONSILLECTOMY     URETEROSCOPY WITH HOLMIUM LASER LITHOTRIPSY Right 01/14/2019   Procedure: URETEROSCOPY WITH HOLMIUM LASER LITHOTRIPSY BLADDER BIOPSIES RIGHT STENT PLACEMENT;  Surgeon: Ardis Hughs, MD;  Location: [facility];  Service: Urology;  Laterality: Right;     Social History:   reports that she has never smoked. She has never used smokeless tobacco. She reports that she does not currently use alcohol. She reports that she does not use drugs.   Family History:  Her family history includes Diabetes Mellitus II in her brother and father; Gastric cancer in her brother; Heart attack in her father; Heart disease in her father; Hypertension in her mother; Stomach cancer in her brother.   Allergies Allergies  Allergen Reactions   Baclofen Other (See Comments)    Somnolence- PUT THE PATIENT INTO A COMA AND "ALMOST KILLED HER"    Tape Other (See Comments)    SKIN WILL TEAR AND BRUISE EASILY!!   Other Other (See Comments)    Patient has a high tolerance to antibiotics- has taken a lot of them during the course of her life   Codeine Other (See Comments)    Increased pain and couldn't sleep   Dextromethorphan-Guaifenesin Other (See Comments)    Unknown reaction     Home Medications  Prior to Admission medications   Medication Sig Start Date End Date Taking? Authorizing Provider  acetaminophen (TYLENOL) 325 MG tablet Take 650 mg by mouth every 6 (six) hours as needed.    [provider]  albuterol (VENTOLIN HFA) 108 (90 Base) MCG/ACT inhaler  INHALE 2 PUFFS BY MOUTH EVERY 4 TO 6 HOURS AS NEEDED FOR WHEEZING, SEE ADMIN INSTRUCTIONS 02/12/21   Adrian Prows, MD  B Complex-C-Zn-Folic Acid (DIALYVITE 408 WITH ZINC) 0.8 MG TABS Take 1 tablet by mouth daily.  07/01/19   [provider]  Biotin 10000 MCG TABS Take 10,000 mcg by mouth daily with breakfast.    [provider]  calcitRIOL (ROCALTROL) 0.5 MCG capsule Take 0.5 mcg by mouth daily.    [provider]  cetirizine (ZYRTEC) 10 MG tablet Take 10 mg by mouth at bedtime.     [provider]  cinacalcet (SENSIPAR) 60 MG tablet Take 60 mg by mouth 2 (two) times a week. Mon friday    [provider]  Cyanocobalamin (VITAMIN B12) 1000 MCG TBCR Take 1,000 mcg by mouth daily.    [provider]  ferric citrate (AURYXIA) 1 GM 210 MG(Fe) tablet Take 420 mg by mouth 3 (three) times daily with meals.     [provider]  fluticasone (FLONASE) 50 MCG/ACT nasal spray Place 2 sprays into both nostrils daily as needed for allergies or rhinitis.    [provider]  fluticasone (FLOVENT HFA) 110 MCG/ACT inhaler Inhale 1 puff into the lungs 2 (two) times daily. 12/24/19   [provider]  HYDROcodone-acetaminophen (NORCO/VICODIN) 5-325 MG tablet Take 1 tablet by mouth every 4 (four) hours as needed for moderate pain. 12/13/19   [provider]  levothyroxine (SYNTHROID) 75 MCG tablet Take 1 tablet (75 mcg total) by mouth daily before breakfast. 04/30/19   Love, Ivan Anchors, PA-C  omeprazole (PRILOSEC) 20 MG capsule TAKE 1 CAPSULE BY MOUTH ONCE DAILY BEFORE BREAKFAST 05/17/21   Cantwell, Celeste C, PA-C  prednisoLONE acetate (PRED FORTE) 1 % ophthalmic suspension Place 1 drop into both eyes 2 (two) times daily. 05/30/21   [provider]  predniSONE (DELTASONE) 20 MG tablet Take 40 mg by mouth daily with breakfast. Tapering dose    [provider]  senna (SENOKOT) 8.6 MG TABS tablet Take 1 tablet by mouth at bedtime.     [provider]  simvastatin (ZOCOR) 40 MG tablet Take 1 tablet (40 mg total) by mouth at bedtime. 04/21/19   Dessa Phi, DO     Critical care time:   50 minutes     CRITICAL CARE Performed by: Otilio Carpen Bracen Schum   Total critical care time:  50 minutes  Critical care time was exclusive of separately billable procedures and treating other patients.  Critical care was necessary to treat or prevent imminent or life-threatening deterioration.  Critical care was time spent personally by me on the following activities: development of treatment plan with patient and/or surrogate as well as nursing, discussions with consultants, evaluation of patient's response to treatment, examination of patient, obtaining history from patient or surrogate, ordering and performing treatments and interventions, ordering and review of laboratory studies, ordering and review of radiographic studies, pulse oximetry and re-evaluation of patient's condition.  Otilio Carpen Ineze Serrao, PA-C [facility] See Amion for pager If no response to pager , please call [phone] until 7pm After 7:00 pm call Elink  [phone]

## 2021-08-06 NOTE — Sepsis Progress Note (Signed)
Elink Continues to follow for sepsis protocol

## 2021-08-06 NOTE — ED Notes (Signed)
MD notified of pt's blood pressure. New orders placed.

## 2021-08-06 NOTE — ED Triage Notes (Signed)
Pt BIB [facility] c/o multiple syncopal episodes over the past few days with generalized weakness and dizziness. Last episode was yesterday at 1300, could barely stand on her own, pt passed out after standing. Pt feels increasingly weak, tired, dizzy, has only been laying at home in hospital bed. Pt does peritoneal dialysis at home, 7 days per week. EKG shows ventricular paced rhythm, rate 105. A/O throughout transport, able to answer questions. Pt pale. Issues palpating radial and brachial pulse. Original BP 58/systolic. Given 579mL NS. BP then 88/systolic, radial pulse thready but palpable. Denies CP, SOB.

## 2021-08-07 ENCOUNTER — Inpatient Hospital Stay (HOSPITAL_COMMUNITY): Payer: Medicare Other

## 2021-08-07 DIAGNOSIS — R1010 Upper abdominal pain, unspecified: Secondary | ICD-10-CM | POA: Diagnosis not present

## 2021-08-07 DIAGNOSIS — R6521 Severe sepsis with septic shock: Secondary | ICD-10-CM | POA: Diagnosis not present

## 2021-08-07 DIAGNOSIS — N186 End stage renal disease: Secondary | ICD-10-CM

## 2021-08-07 DIAGNOSIS — A419 Sepsis, unspecified organism: Secondary | ICD-10-CM | POA: Diagnosis not present

## 2021-08-07 DIAGNOSIS — E1169 Type 2 diabetes mellitus with other specified complication: Secondary | ICD-10-CM | POA: Diagnosis not present

## 2021-08-07 DIAGNOSIS — Z992 Dependence on renal dialysis: Secondary | ICD-10-CM

## 2021-08-07 LAB — MAGNESIUM
Magnesium: 1.2 mg/dL — ABNORMAL LOW (ref 1.7–2.4)
Magnesium: 1.5 mg/dL — ABNORMAL LOW (ref 1.7–2.4)

## 2021-08-07 LAB — BASIC METABOLIC PANEL
Anion gap: 14 (ref 5–15)
BUN: 38 mg/dL — ABNORMAL HIGH (ref 8–23)
CO2: 18 mmol/L — ABNORMAL LOW (ref 22–32)
Calcium: 9.1 mg/dL (ref 8.9–10.3)
Chloride: 99 mmol/L (ref 98–111)
Creatinine, Ser: 7.57 mg/dL — ABNORMAL HIGH (ref 0.44–1.00)
GFR, Estimated: 5 mL/min — ABNORMAL LOW (ref >60)
Glucose, Bld: 197 mg/dL — ABNORMAL HIGH (ref 70–99)
Potassium: 3.2 mmol/L — ABNORMAL LOW (ref 3.5–5.1)
Sodium: 131 mmol/L — ABNORMAL LOW (ref 135–145)

## 2021-08-07 LAB — CBC
HCT: 29 % — ABNORMAL LOW (ref 36.0–46.0)
Hemoglobin: 9.6 g/dL — ABNORMAL LOW (ref 12.0–15.0)
MCH: 31.9 pg (ref 26.0–34.0)
MCHC: 33.1 g/dL (ref 30.0–36.0)
MCV: 96.3 fL (ref 80.0–100.0)
Platelets: 160 10*3/uL (ref 150–400)
RBC: 3.01 MIL/uL — ABNORMAL LOW (ref 3.87–5.11)
RDW: 13.4 % (ref 11.5–15.5)
WBC: 17.8 10*3/uL — ABNORMAL HIGH (ref 4.0–10.5)
nRBC: 0 % (ref 0.0–0.2)

## 2021-08-07 LAB — ACTH STIMULATION, 3 TIME POINTS
Cortisol, 30 Min: 32.1 ug/dL
Cortisol, 60 Min: 39.7 ug/dL
Cortisol, Base: 19.1 ug/dL

## 2021-08-07 LAB — PHOSPHORUS: Phosphorus: 5.6 mg/dL — ABNORMAL HIGH (ref 2.5–4.6)

## 2021-08-07 LAB — CORTISOL: Cortisol, Plasma: 10.8 ug/dL

## 2021-08-07 LAB — GLUCOSE, CAPILLARY
Glucose-Capillary: 139 mg/dL — ABNORMAL HIGH (ref 70–99)
Glucose-Capillary: 148 mg/dL — ABNORMAL HIGH (ref 70–99)
Glucose-Capillary: 182 mg/dL — ABNORMAL HIGH (ref 70–99)
Glucose-Capillary: 195 mg/dL — ABNORMAL HIGH (ref 70–99)
Glucose-Capillary: 208 mg/dL — ABNORMAL HIGH (ref 70–99)
Glucose-Capillary: 252 mg/dL — ABNORMAL HIGH (ref 70–99)

## 2021-08-07 LAB — HEMOGLOBIN A1C
Hgb A1c MFr Bld: 6.2 % — ABNORMAL HIGH (ref 4.8–5.6)
Mean Plasma Glucose: 131.24 mg/dL

## 2021-08-07 LAB — TSH: TSH: 1.184 u[IU]/mL (ref 0.350–4.500)

## 2021-08-07 LAB — CREATININE, SERUM
Creatinine, Ser: 6.7 mg/dL — ABNORMAL HIGH (ref 0.44–1.00)
GFR, Estimated: 6 mL/min — ABNORMAL LOW (ref >60)

## 2021-08-07 LAB — MRSA NEXT GEN BY PCR, NASAL: MRSA by PCR Next Gen: NOT DETECTED

## 2021-08-07 MED ORDER — INSULIN ASPART 100 UNIT/ML IJ SOLN
0.0000 [IU] | Freq: Three times a day (TID) | INTRAMUSCULAR | Status: DC
  Administered 2021-08-08 (×2): 2 [IU] via SUBCUTANEOUS
  Administered 2021-08-08: 3 [IU] via SUBCUTANEOUS

## 2021-08-07 MED ORDER — ESCITALOPRAM OXALATE 10 MG PO TABS
5.0000 mg | ORAL_TABLET | Freq: Every day | ORAL | Status: DC
  Administered 2021-08-07 – 2021-08-17 (×10): 5 mg via ORAL
  Filled 2021-08-07 (×10): qty 1

## 2021-08-07 MED ORDER — DICLOFENAC SODIUM 1 % EX GEL
2.0000 g | Freq: Four times a day (QID) | CUTANEOUS | Status: DC | PRN
  Administered 2021-08-07 – 2021-08-10 (×4): 2 g via TOPICAL
  Filled 2021-08-07: qty 100

## 2021-08-07 MED ORDER — HYDROCODONE-ACETAMINOPHEN 5-325 MG PO TABS
1.0000 | ORAL_TABLET | Freq: Four times a day (QID) | ORAL | Status: DC | PRN
Start: 2021-08-07 — End: 2021-08-09
  Administered 2021-08-08 – 2021-08-09 (×5): 1 via ORAL
  Filled 2021-08-07 (×5): qty 1

## 2021-08-07 MED ORDER — CHLORHEXIDINE GLUCONATE CLOTH 2 % EX PADS
6.0000 | MEDICATED_PAD | Freq: Every day | CUTANEOUS | Status: DC
  Administered 2021-08-07 – 2021-08-15 (×7): 6 via TOPICAL
  Filled 2021-08-07: qty 6

## 2021-08-07 MED ORDER — SENNA 8.6 MG PO TABS
1.0000 | ORAL_TABLET | Freq: Every day | ORAL | Status: DC
  Administered 2021-08-08 – 2021-08-16 (×8): 8.6 mg via ORAL
  Filled 2021-08-07 (×9): qty 1

## 2021-08-07 MED ORDER — HYDROCORTISONE NA SUCCINATE PF 100 MG IJ SOLR
100.0000 mg | Freq: Two times a day (BID) | INTRAMUSCULAR | Status: DC
  Administered 2021-08-07 – 2021-08-08 (×4): 100 mg via INTRAVENOUS
  Filled 2021-08-07 (×4): qty 2

## 2021-08-07 MED ORDER — FENTANYL CITRATE (PF) 100 MCG/2ML IJ SOLN
12.5000 ug | Freq: Once | INTRAMUSCULAR | Status: AC
  Administered 2021-08-08: 12.5 ug via INTRAVENOUS
  Filled 2021-08-07: qty 2

## 2021-08-07 MED ORDER — COSYNTROPIN 0.25 MG IJ SOLR: 0.2500 mg | Freq: Once | INTRAMUSCULAR | Status: DC

## 2021-08-07 MED ORDER — MAGNESIUM SULFATE 2 GM/50ML IV SOLN
2.0000 g | Freq: Once | INTRAVENOUS | Status: AC
  Administered 2021-08-07: 2 g via INTRAVENOUS
  Filled 2021-08-07: qty 50

## 2021-08-07 MED ORDER — PANTOPRAZOLE SODIUM 40 MG PO TBEC
40.0000 mg | DELAYED_RELEASE_TABLET | Freq: Every day | ORAL | Status: DC
  Administered 2021-08-07 – 2021-08-10 (×4): 40 mg via ORAL
  Filled 2021-08-07 (×4): qty 1

## 2021-08-07 MED ORDER — PANTOPRAZOLE SODIUM 40 MG IV SOLR
40.0000 mg | Freq: Every day | INTRAVENOUS | Status: DC
  Administered 2021-08-07: 40 mg via INTRAVENOUS
  Filled 2021-08-07: qty 40

## 2021-08-07 MED ORDER — SODIUM CHLORIDE 0.9 % IV SOLN
250.0000 mL | INTRAVENOUS | Status: DC
  Administered 2021-08-07: 250 mL via INTRAVENOUS

## 2021-08-07 MED ORDER — LEVOTHYROXINE SODIUM 75 MCG PO TABS
75.0000 ug | ORAL_TABLET | Freq: Every day | ORAL | Status: DC
  Administered 2021-08-08 – 2021-08-17 (×10): 75 ug via ORAL
  Filled 2021-08-07 (×6): qty 1
  Filled 2021-08-07: qty 3
  Filled 2021-08-07 (×3): qty 1

## 2021-08-07 MED ORDER — POTASSIUM CHLORIDE CRYS ER 10 MEQ PO TBCR
10.0000 meq | EXTENDED_RELEASE_TABLET | Freq: Once | ORAL | Status: AC
  Administered 2021-08-07: 10 meq via ORAL
  Filled 2021-08-07: qty 1

## 2021-08-07 MED ORDER — MIDODRINE HCL 5 MG PO TABS
5.0000 mg | ORAL_TABLET | Freq: Three times a day (TID) | ORAL | Status: DC
  Administered 2021-08-07 – 2021-08-11 (×11): 5 mg via ORAL
  Filled 2021-08-07 (×12): qty 1

## 2021-08-07 MED ORDER — HEPARIN 1000 UNIT/ML FOR PERITONEAL DIALYSIS
INTRAPERITONEAL | Status: DC | PRN
  Filled 2021-08-07 (×5): qty 3000

## 2021-08-07 MED ORDER — GENTAMICIN SULFATE 0.1 % EX CREA
1.0000 "application " | TOPICAL_CREAM | Freq: Every day | CUTANEOUS | Status: DC
  Administered 2021-08-08 – 2021-08-17 (×5): 1 via TOPICAL
  Filled 2021-08-07 (×3): qty 15

## 2021-08-07 MED ORDER — PIPERACILLIN-TAZOBACTAM IN DEX 2-0.25 GM/50ML IV SOLN
2.2500 g | Freq: Three times a day (TID) | INTRAVENOUS | Status: DC
  Administered 2021-08-07 – 2021-08-13 (×20): 2.25 g via INTRAVENOUS
  Filled 2021-08-07 (×24): qty 50

## 2021-08-07 MED ORDER — SIMVASTATIN 20 MG PO TABS
40.0000 mg | ORAL_TABLET | Freq: Every day | ORAL | Status: DC
  Administered 2021-08-07 – 2021-08-17 (×10): 40 mg via ORAL
  Filled 2021-08-07 (×12): qty 2

## 2021-08-07 MED ORDER — COSYNTROPIN 0.25 MG IJ SOLR
0.2500 mg | Freq: Once | INTRAMUSCULAR | Status: AC
  Administered 2021-08-07: 0.25 mg via INTRAVENOUS
  Filled 2021-08-07: qty 0.25

## 2021-08-07 MED ORDER — HEPARIN 1000 UNIT/ML FOR PERITONEAL DIALYSIS: 500.0000 [IU] | INTRAMUSCULAR | Status: DC | PRN

## 2021-08-07 MED ORDER — DELFLEX-LC/1.5% DEXTROSE 344 MOSM/L IP SOLN: INTRAPERITONEAL | Status: DC

## 2021-08-07 MED ORDER — NOREPINEPHRINE 4 MG/250ML-% IV SOLN
2.0000 ug/min | INTRAVENOUS | Status: DC
Start: 2021-08-07 — End: 2021-08-10
  Administered 2021-08-07: 8 ug/min via INTRAVENOUS
  Administered 2021-08-07: 10 ug/min via INTRAVENOUS
  Administered 2021-08-08: 2 ug/min via INTRAVENOUS
  Filled 2021-08-07 (×3): qty 250

## 2021-08-07 MED ORDER — SODIUM CHLORIDE 0.9 % IV SOLN
1.0000 g | INTRAVENOUS | Status: DC
  Administered 2021-08-07: 1 g via INTRAVENOUS
  Filled 2021-08-07: qty 1

## 2021-08-07 MED ORDER — INSULIN ASPART 100 UNIT/ML IJ SOLN
0.0000 [IU] | INTRAMUSCULAR | Status: DC
  Administered 2021-08-07: 1 [IU] via SUBCUTANEOUS
  Administered 2021-08-07: 2 [IU] via SUBCUTANEOUS
  Administered 2021-08-07: 3 [IU] via SUBCUTANEOUS
  Administered 2021-08-07: 1 [IU] via SUBCUTANEOUS

## 2021-08-07 MED ORDER — TECHNETIUM TC 99M MEBROFENIN IV KIT
5.5000 | PACK | Freq: Once | INTRAVENOUS | Status: AC | PRN
  Administered 2021-08-07: 5.5 via INTRAVENOUS

## 2021-08-07 NOTE — Progress Notes (Signed)
PCCM INTERVAL PROGRESS NOTE   Chronic leg pain. Requesting medication: NPO currenlty for HIDA  Voltaren Hydrocodone for after testing.       Georgann Housekeeper, AGACNP-BC [facility]  See Amion for personal pager PCCM on call pager [phone] until 7pm. Please call Elink 7p-7a. [phone]  08/07/2021 6:25 PM

## 2021-08-07 NOTE — Progress Notes (Signed)
eLink Physician-Brief Progress Note Patient Name: Erika Fry DOB: 02/03/1946 MRN: [id number]   Date of Service  08/07/2021  HPI/Events of Note  Pt with ESRD who is on PD at home.  Admitted with shock after running out of her usual dose of midodrine.  Has abd pain, has ? Peritonitis or other nidus of infxn.  eICU Interventions  On levo 64mcg.  Sleeping comfortable.  Is 5000cc net (+) .  Continue current abx and supportive measures.     Intervention Category Evaluation Type: New Patient Evaluation  Tilden Dome 08/07/2021, 1:38 AM

## 2021-08-07 NOTE — Consult Note (Signed)
[facility] Renal Consultation Note    Indication for Consultation:  Management of ESRD/hemodialysis; anemia, hypertension/volume and secondary hyperparathyroidism  WVP:XTGG, Ravisankar, MD  HPI: Erika Fry is a 75 y.o. female with ESRD on home nightly PD. She is seen by Dr. Joelyn Oms at [facility]. Her past medical history is significant for AV block with pacemaker, diabetes, GERD, hyperlipidemia, hypothyroidism, and arthritis. She presented to the ER for further evaluation for a near syncopal episode which occurred earlier today. Reviewed outpatient hemodialysis records-apparently, she reported episodes of dizziness and hypotension (sys in 80s) a few weeks ago in which Midodrine was then initiated-she recently ran out of her Midodrine d/t pharmacy shortage. She presented today hypotensive and Levophed has been initiated and IVF were administered. Pertinent labs include: K+ 3.2, BUN 38, creatinine 7.57, PO4 5.6, Mg 1.5, lactic acid 2.1 (trending down), Hgb 9.6, and WBC 17.8. CT ABD/pelvis was performed which showed gallbladder stones and layering sludge w/o evidence of cholecystitis with moderate-severe R-sided hydronephrosis w/o obstructing calculus. Seen and examined patient at bedside. Patient currently alert and oriented and denies abdominal pain. Assessed the PD catheter which showed no evidence of an acute infection. Plan to resume PD tonight per her usual schedule.   Past Medical History:  Diagnosis Date   Arthritis    "knees, thumbs" (03/25/2018)   AV block, [place] 2 03/25/2018   CKD (chronic kidney disease), stage IV (HCC)    Complication of anesthesia    Reports had hard time waking up in the past   Diet-controlled diabetes mellitus ([place])    Encounter for care of pacemaker 09/07/2019   End stage renal failure on dialysis ([place])    T, Th, Sat [place]   GERD (gastroesophageal reflux disease)    Gout    "on daily RX" (03/25/2018)   Heart murmur     High cholesterol    History of COVID-19 12/2019   Hypothyroidism    Iron deficiency anemia    "had to get an iron infusion"   Migraine    "used to have them growing up" (03/25/2018)   Pneumonia 12/2019   with COVID   Presence of permanent cardiac pacemaker 03/25/2018   medtronic dual pacemaker: Dr Crissie Sickles   Past Surgical History:  Procedure Laterality Date   APPENDECTOMY     ARTERY BIOPSY Right 06/25/2021   Procedure: TEMPORAL ARTERY BIOPSY RIGHT;  Surgeon: Armandina Gemma, MD;  Location: [facility];  Service: General;  Laterality: Right;  LMA   AV FISTULA PLACEMENT Right 06/29/2019   Procedure: ARTERIOVENOUS (AV) FISTULA CREATION RIGHT UPPER  ARM;  Surgeon: Waynetta Sandy, MD;  Location: [place];  Service: Vascular;  Laterality: Right;   [place] Right 09/08/2019   Procedure: BASILIC VEIN TRANSPOSITION RIGHT SECOND STAGE;  Surgeon: Waynetta Sandy, MD;  Location: [place];  Service: Vascular;  Laterality: Right;   BIOPSY  05/19/2018   Procedure: BIOPSY;  Surgeon: Ladene Artist, MD;  Location: [place];  Service: Endoscopy;;   CYSTOSCOPY W/ RETROGRADES Bilateral 01/14/2019   Procedure: CYSTOSCOPY WITH RETROGRADE PYELOGRAM BILATERAL HYDRODISTENTION;  Surgeon: Ardis Hughs, MD;  Location: [facility];  Service: Urology;  Laterality: Bilateral;   ESOPHAGOGASTRODUODENOSCOPY N/A 05/19/2018   Procedure: ESOPHAGOGASTRODUODENOSCOPY (EGD);  Surgeon: Ladene Artist, MD;  Location: [facility] ENDOSCOPY;  Service: Endoscopy;  Laterality: N/A;   ESOPHAGOGASTRODUODENOSCOPY (EGD) WITH PROPOFOL N/A 01/28/2019   Procedure: ESOPHAGOGASTRODUODENOSCOPY (EGD) WITH PROPOFOL;  Surgeon: Rush Landmark Telford Nab., MD;  Location: [place];  Service: Gastroenterology;  Laterality: N/A;   INSERT / REPLACE / REMOVE PACEMAKER  03/25/2018   IR FLUORO GUIDE CV LINE RIGHT  05/18/2018   IR FLUORO GUIDE CV LINE RIGHT  06/24/2019   IR LUMBAR DISC ASPIRATION W/IMG GUIDE  05/15/2018   IR  NEPHROSTOMY EXCHANGE RIGHT  05/28/2019   IR NEPHROSTOMY EXCHANGE RIGHT  07/19/2019   IR NEPHROSTOMY PLACEMENT RIGHT  04/09/2019   IR REMOVAL TUN CV CATH W/O FL  07/23/2018   IR US GUIDE VASC ACCESS RIGHT  05/18/2018   IR US GUIDE VASC ACCESS RIGHT  06/24/2019   KNEE ARTHROSCOPY Bilateral    PACEMAKER IMPLANT N/A 03/25/2018   Procedure: PACEMAKER IMPLANT;  Surgeon: Evans Lance, MD;  Location: [place] [facility];  Service: Cardiovascular;  Laterality: N/A;   PACEMAKER PLACEMENT Left 03/2018   POLYPECTOMY  01/28/2019   Procedure: POLYPECTOMY;  Surgeon: Mansouraty, Telford Nab., MD;  Location: [place];  Service: Gastroenterology;;   RIGHT HEART CATH N/A 03/20/2018   Procedure: RIGHT HEART CATH;  Surgeon: Nigel Mormon, MD;  Location: [place] [facility];  Service: Cardiovascular;  Laterality: N/A;   TEE WITHOUT CARDIOVERSION N/A 03/20/2018   Procedure: TRANSESOPHAGEAL ECHOCARDIOGRAM (TEE);  Surgeon: Nigel Mormon, MD;  Location: [facility] ENDOSCOPY;  Service: Cardiovascular;  Laterality: N/A;   TONSILLECTOMY     URETEROSCOPY WITH HOLMIUM LASER LITHOTRIPSY Right 01/14/2019   Procedure: URETEROSCOPY WITH HOLMIUM LASER LITHOTRIPSY BLADDER BIOPSIES RIGHT STENT PLACEMENT;  Surgeon: Ardis Hughs, MD;  Location: [facility];  Service: Urology;  Laterality: Right;   Family History  Problem Relation Age of Onset   Hypertension Mother    Diabetes Mellitus II Father    Heart disease Father    Heart attack Father    Gastric cancer Brother    Diabetes Mellitus II Brother    Stomach cancer Brother    Social History:  reports that she has never smoked. She has never used smokeless tobacco. She reports that she does not currently use alcohol. She reports that she does not use drugs. Allergies  Allergen Reactions   Baclofen Other (See Comments)    Somnolence- PUT THE PATIENT INTO A COMA AND "ALMOST KILLED HER"    Tape Other (See Comments)    SKIN WILL TEAR AND BRUISE EASILY!!   Other Other (See  Comments)    Patient has a high tolerance to antibiotics- has taken a lot of them during the course of her life   Codeine Other (See Comments)    Increased pain and couldn't sleep   Dextromethorphan-Guaifenesin Other (See Comments)    Unknown reaction   Prior to Admission medications   Medication Sig Start Date End Date Taking? Authorizing Provider  acetaminophen (TYLENOL) 325 MG tablet Take 650 mg by mouth every 6 (six) hours as needed for moderate pain or headache.   Yes [provider]  albuterol (VENTOLIN HFA) 108 (90 Base) MCG/ACT inhaler INHALE 2 PUFFS BY MOUTH EVERY 4 TO 6 HOURS AS NEEDED FOR WHEEZING, SEE ADMIN INSTRUCTIONS Patient taking differently: Inhale 2 puffs into the lungs every 4 (four) hours as needed for shortness of breath or wheezing. 02/12/21  Yes Adrian Prows, MD  B Complex-C-Zn-Folic Acid (DIALYVITE 502 WITH ZINC) 0.8 MG TABS Take 1 tablet by mouth daily.  07/01/19  Yes [provider]  Biotin 10000 MCG TABS Take 10,000 mcg by mouth daily with breakfast.   Yes [provider]  calcitRIOL (ROCALTROL) 0.5 MCG capsule Take 0.5 mcg by mouth  daily.   Yes [provider]  cetirizine (ZYRTEC) 10 MG tablet Take 10 mg by mouth at bedtime.    Yes [provider]  cinacalcet (SENSIPAR) 60 MG tablet Take 60 mg by mouth 2 (two) times a week. Mon friday   Yes [provider]  Cyanocobalamin (VITAMIN B12) 1000 MCG TBCR Take 1,000 mcg by mouth daily.   Yes [provider]  escitalopram (LEXAPRO) 5 MG tablet Take 5 mg by mouth daily. 07/25/21  Yes [provider]  ferric citrate (AURYXIA) 1 GM 210 MG(Fe) tablet Take 420 mg by mouth 3 (three) times daily with meals.    Yes [provider]  fluticasone (FLONASE) 50 MCG/ACT nasal spray Place 2 sprays into both nostrils daily as needed for allergies or rhinitis.   Yes [provider]  HYDROcodone-acetaminophen (NORCO/VICODIN) 5-325 MG tablet Take 1 tablet by  mouth every 4 (four) hours as needed for moderate pain. 12/13/19  Yes [provider]  levothyroxine (SYNTHROID) 75 MCG tablet Take 1 tablet (75 mcg total) by mouth daily before breakfast. 04/30/19  Yes Love, Ivan Anchors, PA-C  Methoxy PEG-Epoetin Beta (MIRCERA IJ) Inject into the skin. 07/25/21  Yes [provider]  midodrine (PROAMATINE) 5 MG tablet Take 5 mg by mouth 2 (two) times daily. 07/26/21  Yes [provider]  omeprazole (PRILOSEC) 20 MG capsule TAKE 1 CAPSULE BY MOUTH ONCE DAILY BEFORE BREAKFAST Patient taking differently: Take 20 mg by mouth daily. 05/17/21  Yes Cantwell, Celeste C, PA-C  senna (SENOKOT) 8.6 MG TABS tablet Take 1 tablet by mouth at bedtime.   Yes [provider]  simvastatin (ZOCOR) 40 MG tablet Take 1 tablet (40 mg total) by mouth at bedtime. 04/21/19  Yes Dessa Phi, DO  fluticasone (FLOVENT HFA) 110 MCG/ACT inhaler Inhale 1 puff into the lungs 2 (two) times daily. Patient not taking: Reported on 08/06/2021 12/24/19   [provider]  prednisoLONE acetate (PRED FORTE) 1 % ophthalmic suspension Place 1 drop into both eyes 2 (two) times daily. Patient not taking: Reported on 08/07/2021 05/30/21   [provider]  predniSONE (DELTASONE) 20 MG tablet Take 40 mg by mouth daily with breakfast. Tapering dose Patient not taking: Reported on 08/07/2021    [provider]   Current Facility-Administered Medications  Medication Dose Route Frequency Provider Last Rate Last Admin   0.9 %  sodium chloride infusion  250 mL Intravenous Continuous Juanito Doom, MD   Stopped at 08/07/21 1629   Chlorhexidine Gluconate Cloth 2 % PADS 6 each  6 each Topical Q0600 Renee Pain, MD   6 each at 08/07/21 0049   dialysis solution 1.5% low-MG/low-CA dianeal solution   Intraperitoneal Q24H Tobie Poet E, NP       diclofenac Sodium (VOLTAREN) 1 % topical gel 2 g  2 g Topical QID PRN Corey Harold, NP   2 g at 08/07/21 1903    docusate sodium (COLACE) capsule 100 mg  100 mg Oral BID PRN Gleason, Otilio Carpen, PA-C   100 mg at 08/07/21 0909   escitalopram (LEXAPRO) tablet 5 mg  5 mg Oral Daily Corey Harold, NP       gentamicin cream (GARAMYCIN) 0.1 % 1 application  1 application Topical Daily Tobie Poet E, NP       heparin 2,500 Units in dialysis solution 1.5% low-MG/low-CA 3,000 mL dialysis solution   Peritoneal Dialysis PRN Juanito Doom, MD       heparin injection  5,000 Units  5,000 Units Subcutaneous Q8H Gleason, Otilio Carpen, PA-C   5,000 Units at 08/07/21 1342   hydrocortisone sodium succinate (SOLU-CORTEF) 100 MG injection 100 mg  100 mg Intravenous Q12H Corey Harold, NP   100 mg at 08/07/21 1342   [START ON 08/08/2021] insulin aspart (novoLOG) injection 0-6 Units  0-6 Units Subcutaneous TID AC & HS Frederik Pear, MD       [START ON 08/08/2021] levothyroxine (SYNTHROID) tablet 75 mcg  75 mcg Oral Q0600 Corey Harold, NP       midodrine (PROAMATINE) tablet 5 mg  5 mg Oral TID WC Corey Harold, NP       norepinephrine (LEVOPHED) 4mg  in 269mL premix infusion  2-10 mcg/min Intravenous Titrated Corey Harold, NP 7.5 mL/hr at 08/07/21 2000 2 mcg/min at 08/07/21 2000   pantoprazole (PROTONIX) EC tablet 40 mg  40 mg Oral QHS Corey Harold, NP       piperacillin-tazobactam (ZOSYN) IVPB 2.25 g  2.25 g Intravenous Q8H Corey Harold, NP   Stopped at 08/07/21 1423   polyethylene glycol (MIRALAX / GLYCOLAX) packet 17 g  17 g Oral Daily PRN Gleason, Otilio Carpen, PA-C   17 g at 08/07/21 0909   potassium chloride (KLOR-CON) CR tablet 10 mEq  10 mEq Oral Once Corey Harold, NP       senna (SENOKOT) tablet 8.6 mg  1 tablet Oral QHS Corey Harold, NP       simvastatin (ZOCOR) tablet 40 mg  40 mg Oral q1800 Corey Harold, NP       Labs: Basic Metabolic Panel: Recent Labs  Lab 08/06/21 1725 08/07/21 0010 08/07/21 0626  NA 131*  --  131*  K 2.7*  --  3.2*  CL 91*  --  99  CO2 21*  --  18*  GLUCOSE 168*  --   197*  BUN 38*  --  38*  CREATININE 8.50* 6.70* 7.57*  CALCIUM 9.6  --  9.1  PHOS  --   --  5.6*   Liver Function Tests: Recent Labs  Lab 08/06/21 1725  AST 19  ALT 13  ALKPHOS 126  BILITOT 0.7  PROT 6.1*  ALBUMIN 2.4*   Recent Labs  Lab 08/06/21 1725  LIPASE 28   No results for input(s): AMMONIA in the last 168 hours. CBC: Recent Labs  Lab 08/06/21 1925 08/07/21 0626  WBC 14.1* 17.8*  NEUTROABS 10.1*  --   HGB 9.9* 9.6*  HCT 29.2* 29.0*  MCV 96.7 96.3  PLT 131* 160   Cardiac Enzymes: No results for input(s): CKTOTAL, CKMB, CKMBINDEX, TROPONINI in the last 168 hours. CBG: Recent Labs  Lab 08/07/21 0053 08/07/21 0420 08/07/21 0718 08/07/21 1131 08/07/21 1705  GLUCAP 148* 208* 195* 252* 139*   Iron Studies: No results for input(s): IRON, TIBC, TRANSFERRIN, FERRITIN in the last 72 hours. Studies/Results: CT ABDOMEN PELVIS WO CONTRAST  Result Date: 08/06/2021 CLINICAL DATA:  Acute abdominal pain. History of peritoneal dialysis. EXAM: CT ABDOMEN AND PELVIS WITHOUT CONTRAST TECHNIQUE: Multidetector CT imaging of the abdomen and pelvis was performed following the standard protocol without IV contrast. COMPARISON:  CT abdomen and pelvis 05/10/2019. FINDINGS: Lower chest: There is a stable 4 mm nodular density in the left lower lobe image 5/5. There is linear scarring or atelectasis in both lung bases. Cannot exclude nodular density in the left lower lung measuring 8 mm image 5/1. Leads are partially visualized in the heart.  Hepatobiliary: There is gallbladder sludge versus small layering stones within the gallbladder. There is no biliary ductal dilatation identified. The liver appears within normal limits without focal lesion allowing for lack of intravenous contrast. Pancreas: Unremarkable. No pancreatic ductal dilatation or surrounding inflammatory changes. Spleen: Normal in size without focal abnormality. Adrenals/Urinary Tract: The bilateral adrenal glands appear within  normal limits. There is mild bilateral renal atrophy which has progressed compared to the prior study. There are nonobstructing left renal calculi measuring up to 4 mm. There is a subcentimeter fat containing lesion in the left kidney, unchanged, favored is angiomyolipoma. There is no left-sided hydronephrosis. The left ureter is within normal limits. There is moderate/severe right-sided hydronephrosis to the level of the ureteropelvic junction. There are numerous calculi within the right renal pelvis measuring up to 4 mm. There are additional punctate right renal calculi. No obstructing calculi are identified at the right UPJ or right ureter. The bladder is completely decompressed and not well evaluated. Stomach/Bowel: Stomach is within normal limits. Appendix is not seen. No evidence of bowel wall thickening, distention, or inflammatory changes. Vascular/Lymphatic: Aortic atherosclerosis. No enlarged abdominal or pelvic lymph nodes. Reproductive: Uterus and bilateral adnexa are unremarkable. Other: There is a small amount of free fluid in free air throughout the abdomen and pelvis, presumably from peritoneal dialysis. Peritoneal dialysis catheter is seen entering the anterior abdominal wall with distal tip of the catheter in the left pelvis. The visualized portion of the catheter appears intact. There is no focal abdominal wall hernia. Musculoskeletal: Degenerative changes affect the spine. No acute fractures are seen. IMPRESSION: 1. Small amount of free fluid and free air throughout the abdomen and pelvis, presumably secondary to peritoneal dialysis. 2. Moderate severe right-sided hydronephrosis to the level of the ureteropelvic junction. No obstructing calculus identified. Obstruction may be related to stricture or mass at the UPJ. 3. Right renal pelvic calculi.  Bilateral renal calculi. 4. Cholelithiasis and/or gallbladder sludge. 5. Questionable 8 mm nodular density in the left lower lung, incompletely imaged.  Follow-up nonemergent chest CT recommended. Electronically Signed   By: Ronney Asters M.D.   On: 08/06/2021 18:35   DG Chest Port 1 View  Result Date: 08/06/2021 CLINICAL DATA:  Sepsis protocol EXAM: PORTABLE CHEST 1 VIEW COMPARISON:  Portable exam 1818 hours compared to 05/29/2021 FINDINGS: LEFT subclavian sequential transvenous pacemaker leads project at RIGHT atrium and RIGHT ventricle. Enlargement of cardiac silhouette. Mediastinal contours and pulmonary vascularity normal. Atherosclerotic calcification aorta. Bronchitic changes without infiltrate, pleural effusion, or pneumothorax. Bones demineralized. IMPRESSION: Enlargement of cardiac silhouette post pacemaker. Bronchitic changes without acute infiltrate. Aortic Atherosclerosis (ICD10-I70.0). Electronically Signed   By: Lavonia Dana M.D.   On: 08/06/2021 18:32    Physical Exam: Vitals:   08/07/21 1930 08/07/21 1945 08/07/21 2000 08/07/21 2015  BP: (!) 100/51  (!) 96/56   Pulse: 63 68 76 75  Resp: 18 20 18 17   Temp:   98.1 F (36.7 C)   TempSrc:   Axillary   SpO2: 98% 100% 100% 98%  Weight:      Height:         General: WDWN; NAD; on RA Lungs: CTA bilaterally. No wheeze, rales or rhonchi. Breathing is unlabored. Heart: RRR. No murmur, rubs or gallops.  Abdomen: soft, nontender, active bowel sounds, no guarding, no rebound tenderness Lower extremities: no edema but noted discoloration bilateral lower extremities Neuro: AAOx3. Moves all extremities spontaneously. Dialysis Access: RUQ PD catheter-no evidence of erythema, purulent drainage, or warmth around catheter  site  Dialysis Orders:  [facility] 7x/week-[facility] EDW 80kg 4.25% bags 6 Exchanges Fill Volume 2853ml Cyc Therapy Volume 1629ml Avg dwell time 1hr80min No Day Exchanges  Assessment/Plan: Syncopal episode/Dizziness/Hypotension-managed by primary critical care team ESRD - on nightly PD, will resume here. Will obtain cell count and culture from peritoneal  fluid in AM after exchanges are complete. Hypertension/volume  - Bps soft-on levophed drip-continue Midodrine Anemia of CKD - Hgb 9.6-monitor trends, may need ESA if indicated. Secondary Hyperparathyroidism - Ca and PO4 okay-monitor trends. Nutrition - Can advance to renal diet with fluid restriction when clinically stable  Tobie Poet, NP [employer] 08/07/2021, 8:52 PM

## 2021-08-07 NOTE — Progress Notes (Signed)
eLink Physician-Brief Progress Note Patient Name: Erika Fry DOB: 1946/09/19 MRN: [id number]   Date of Service  08/07/2021  HPI/Events of Note  Gall bladder ultrasound negative for biliary obstruction or acute cholecystitis.  eICU Interventions  PRN oral Norco ordered and NPO order discontinued.        Kerry Kass [name] 08/07/2021, 11:53 PM

## 2021-08-07 NOTE — Progress Notes (Addendum)
NAME:  Erika Fry, MRN:  [id number], DOB:  Jul 25, 1946, LOS: 1 ADMISSION DATE:  08/06/2021, CONSULTATION DATE:  08/07/21 REFERRING MD:  EDP , CHIEF COMPLAINT:  dizziness   History of Present Illness:  Erika Fry is a 75 y.o. with a past medical history of ESRD on peritoneal dialysis, AV block with pacemaker, diabetes, GERD, hyperlipidemia, hypothyroidism, arthritis who presented with a chief complaint of generalized weakness and dizziness.  States that she ran out of her midodrine for over a week secondary to shortages at the pharmacy.  She states she had a syncopal episode yesterday, this is not the first for her was seen for this in 05/2021 with negative head CT.  However she remained dizzy today with abdominal pain after eating and reduced appetite, so presented to the ED.   She was initially hypotensive to 64/51, this improved with 3.5L IVF but MAP remained <65, so peripheral Levophed was initiated. Further work-up revealed initial lactic acid of 3.0 which improved to 2.1 after fluids, potassium 2.7, bicarb 21, anion gap 19, troponin 80, normal bilirubin and LFTs, and white blood cell count of 14 K.  CT abdomen/pelvis with gallbladder stones and layering sludge without evidence of cholecystitis with moderate-severe right-sided hydronephrosis without obstructing calculus.  Of note patient has a known history of both gallbladder stones and current hydronephrosis following ureteral stent removal in 2020   Pertinent  Medical History   has a past medical history of Arthritis, AV block, Mobitz 2 (03/25/2018), CKD (chronic kidney disease), stage IV ([place]), Complication of anesthesia, Diet-controlled diabetes mellitus ([place]), Encounter for care of pacemaker (09/07/2019), End stage renal failure on dialysis [facility]), GERD (gastroesophageal reflux disease), Gout, Heart murmur, High cholesterol, History of COVID-19 (12/2019), Hypothyroidism, Iron deficiency anemia, Migraine, Pneumonia (12/2019), and  Presence of permanent cardiac pacemaker (03/25/2018).   Significant Hospital Events: Including procedures, antibiotic start and stop dates in addition to other pertinent events   9/5, admit to critical care for hypotension, peripheral Levophed initiated   Micro: Blood 9/5 > Urine 9/5 >  ABX: - Cefepime 9/5 >9/6 - Vanco 9/5 >9/6 - Flagyl in ED 9/5  - Zosyn 9/6 >>>  Imaging: - CT abd 9/5 > Moderate severe right-sided hydronephrosis to the level of the ureteropelvic junctionerate severe right-sided hydronephrosis to the level of the ureteropelvic junction. Right renal pelvic calculi.  Bilateral renal calculi. Cholelithiasis and/or gallbladder sludge. Questionable 8 mm nodular density in the left lower lung.  Interim History / Subjective:  Feeling much better today. Dizziness improved. Abdominal pain improved. No complaints currently. Eating breakfast.   Objective   Blood pressure (!) 109/56, pulse 64, temperature 98.4 F (36.9 C), temperature source Oral, resp. rate 18, height 5\' 5"  (1.651 m), weight 83.4 kg, SpO2 100 %.        Intake/Output Summary (Last 24 hours) at 08/07/2021 0821 Last data filed at 08/07/2021 0700 Gross per 24 hour  Intake 6178.91 ml  Output --  Net 6178.91 ml    Filed Weights   08/06/21 1716 08/07/21 0050 08/07/21 0354  Weight: 74.8 kg 83.4 kg 83.4 kg   General: Elderly appearing female in NAD HEENT: MM dry. [place]/AT, PERRL, no JVD Neuro: Alert, oriented, non-focal CV: RRR, no MRG PULM: Clear, unlabored.  GI: Soft, mildly tender. PD catheter in place. Non-distended.  Extremities: No acute deformity. Bilateral ankles tender, which she reports is non-acute.  Skin: no rashes or lesions  Resolved Hospital Problem list     Assessment & Plan:   Shock,  likely septic + hypovolemic with intraabdominal source vs less likely aspiration.  Suspect her lightheadedness is associated with hypotension in the setting of decreased p.o. intake and abruptly stopping  midodrine.  Question possible developing infection, she has lactic acidosis and leukocytosis, though white count improved from a month ago and has been afebrile.  Patient denies normal PD sessions with no concern for infection. No infiltrate on CXR, no URI symptoms. She tells me her baseline SBP is in the 80s. - Cortisol 10.  - Recent temporal artery biopsy 06/2021 to rule out temporal artery arteritis and was on steroids, (Path benign) P: -norepinephrine for SBP goal > 64mmHg and unchanged mental status.  -Narrow ABX. DC vancomycin. Change cefepime to Zosyn to cover anaerobes with ? intraabdominal source.  -Oral hydration as tolerated.  -Add home midodrine. Will dose 5mg  TID as opposed to her home 5mg  BID - start stress dose steroids  Postprandial abdominal pain, GERD, cholelithiasis History of chronic dysphagia, seen by GI  several months ago.  Esophagram and MBSS done 2021 were unremarkable.  History of bleeding gastric polyp that was resected and 01/2019, was to follow-up for EGD.  Has a history of cholelithiasis, no evidence of acute cholecystitis on CT and both bili and LFTs are unremarkable. P: - Continue PPI - Will need outpatient GI follow-up for EGD - HIDA scan pending.  ESRD on peritoneal dialysis - Nephrology has seen.   Hypokalemia Initially 2.7, denies any issues with continual dialysis, suspect may be related to poor p.o. intake -Give K 10 - Mag repleted by night team.   AGMA Suspect related to lactic acidosis, follow metabolic panel  Hypothyroidism -Check TSH and continue Synthroid  Chronic dyspnea: seen by Dr. Silas Flood in the pulmonary clinic. With plans for PFT. - FVC 81%, TLC 83%, FEV1 84%, No sig change with bronchodilators. DLCO/VA 83% pred. Consistent with mild obstructive disease.  Lung nodule 31mm  left lower lobe.  P:  - PRN albuterol. - Follow up in pulm clinic with Dr. Silas Flood.   Best Practice (right click and "Reselect all SmartList Selections" daily)    Diet/type: Regular consistency (see orders) DVT prophylaxis: prophylactic heparin  GI prophylaxis: PPI Lines: N/A Foley:  N/A Code Status:  full code Last date of multidisciplinary goals of care discussion []   Critical care time:   44 mins     Georgann Housekeeper, AGACNP-BC [facility]  See Amion for personal pager PCCM on call pager [phone] until 7pm. Please call Elink 7p-7a. [phone]  08/07/2021 8:22 AM

## 2021-08-07 NOTE — Plan of Care (Signed)

## 2021-08-07 NOTE — Progress Notes (Signed)
eLink Physician-Brief Progress Note Patient Name: Erika Fry DOB: 04/10/1946 MRN: [id number]   Date of Service  08/07/2021  HPI/Events of Note  Patient asking for resumption of her home Oxycodone, however her HIDA scan is positive, and radiology recommended a RUQ ultrasound for confirmation.  eICU Interventions  Will keep her NPO and order a stat RUQ ultrasound, will give her one dose of Fentanyl 12.5 mg iv for pain pending RUQ [place] result.        Kerry Kass Akash Winski 08/07/2021, 9:53 PM

## 2021-08-07 NOTE — Consult Note (Signed)
Erika Fry 08/14/1946  [id number].    Requesting MD: Dr. Billy Fischer Chief Complaint/Reason for Consult: sepsis, cholelithiasis  HPI:  Erika Fry is a 75 yo female with multiple medical comorbidities who presented to the ED today with weakness and dizziness.  She has been feeling unwell for the last several days and reports a syncopal episode yesterday.  She denies any fevers or chills.  She also endorses abdominal pain in the upper abdomen that occurs after eating, although she reports this has been ongoing for a while.  On arrival to the ED she was hypotensive with a systolic blood pressure in the 60s, and was given several liters of IV fluids.  She was also started on Levophed.  Labs were significant for WBC of 14.  Bilirubin, LFTs, and lipase were all normal.  CT abdomen and pelvis showed gallstones but no pericholecystic fluid.  General surgery was consulted due to concern for cholecystitis.  Patient's prior abdominal surgeries include PD catheter placement, which she currently uses for peritoneal dialysis at home.  ROS: Review of Systems  Constitutional:  Negative for chills and fever.  Cardiovascular:  Negative for chest pain.  Gastrointestinal:  Positive for abdominal pain and nausea. Negative for vomiting.  Neurological:  Positive for loss of consciousness and weakness.   Family History  Problem Relation Age of Onset   Hypertension Mother    Diabetes Mellitus II Father    Heart disease Father    Heart attack Father    Gastric cancer Brother    Diabetes Mellitus II Brother    Stomach cancer Brother     Past Medical History:  Diagnosis Date   Arthritis    "knees, thumbs" (03/25/2018)   AV block, Mobitz 2 03/25/2018   CKD (chronic kidney disease), stage IV (HCC)    Complication of anesthesia    Reports had hard time waking up in the past   Diet-controlled diabetes mellitus ([place])    Encounter for care of pacemaker 09/07/2019   End stage renal failure on  dialysis ([place])    T, Th, Sat [place]   GERD (gastroesophageal reflux disease)    Gout    "on daily RX" (03/25/2018)   Heart murmur    High cholesterol    History of COVID-19 12/2019   Hypothyroidism    Iron deficiency anemia    "had to get an iron infusion"   Migraine    "used to have them growing up" (03/25/2018)   Pneumonia 12/2019   with COVID   Presence of permanent cardiac pacemaker 03/25/2018   medtronic dual pacemaker: Dr Crissie Sickles    Past Surgical History:  Procedure Laterality Date   APPENDECTOMY     ARTERY BIOPSY Right 06/25/2021   Procedure: TEMPORAL ARTERY BIOPSY RIGHT;  Surgeon: Armandina Gemma, MD;  Location: [facility];  Service: General;  Laterality: Right;  LMA   AV FISTULA PLACEMENT Right 06/29/2019   Procedure: ARTERIOVENOUS (AV) FISTULA CREATION RIGHT UPPER  ARM;  Surgeon: Waynetta Sandy, MD;  Location: [place];  Service: Vascular;  Laterality: Right;   [place] Right 09/08/2019   Procedure: BASILIC VEIN TRANSPOSITION RIGHT SECOND STAGE;  Surgeon: Waynetta Sandy, MD;  Location: [place];  Service: Vascular;  Laterality: Right;   BIOPSY  05/19/2018   Procedure: BIOPSY;  Surgeon: Ladene Artist, MD;  Location: [facility]COPY;  Service: Endoscopy;;   CYSTOSCOPY W/ RETROGRADES Bilateral 01/14/2019   Procedure: CYSTOSCOPY WITH RETROGRADE PYELOGRAM BILATERAL HYDRODISTENTION;  Surgeon:  Ardis Hughs, MD;  Location: [facility];  Service: Urology;  Laterality: Bilateral;   ESOPHAGOGASTRODUODENOSCOPY N/A 05/19/2018   Procedure: ESOPHAGOGASTRODUODENOSCOPY (EGD);  Surgeon: Ladene Artist, MD;  Location: [facility] ENDOSCOPY;  Service: Endoscopy;  Laterality: N/A;   ESOPHAGOGASTRODUODENOSCOPY (EGD) WITH PROPOFOL N/A 01/28/2019   Procedure: ESOPHAGOGASTRODUODENOSCOPY (EGD) WITH PROPOFOL;  Surgeon: Rush Landmark Telford Nab., MD;  Location: [place];  Service: Gastroenterology;  Laterality: N/A;   INSERT / REPLACE / REMOVE PACEMAKER  03/25/2018   IR  FLUORO GUIDE CV LINE RIGHT  05/18/2018   IR FLUORO GUIDE CV LINE RIGHT  06/24/2019   IR LUMBAR DISC ASPIRATION W/IMG GUIDE  05/15/2018   IR NEPHROSTOMY EXCHANGE RIGHT  05/28/2019   IR NEPHROSTOMY EXCHANGE RIGHT  07/19/2019   IR NEPHROSTOMY PLACEMENT RIGHT  04/09/2019   IR REMOVAL TUN CV CATH W/O FL  07/23/2018   IR US GUIDE VASC ACCESS RIGHT  05/18/2018   IR US GUIDE VASC ACCESS RIGHT  06/24/2019   KNEE ARTHROSCOPY Bilateral    PACEMAKER IMPLANT N/A 03/25/2018   Procedure: PACEMAKER IMPLANT;  Surgeon: Evans Lance, MD;  Location: [place] CV [facility];  Service: Cardiovascular;  Laterality: N/A;   PACEMAKER PLACEMENT Left 03/2018   POLYPECTOMY  01/28/2019   Procedure: POLYPECTOMY;  Surgeon: Mansouraty, Telford Nab., MD;  Location: [place];  Service: Gastroenterology;;   RIGHT HEART CATH N/A 03/20/2018   Procedure: RIGHT HEART CATH;  Surgeon: Nigel Mormon, MD;  Location: [place] [facility];  Service: Cardiovascular;  Laterality: N/A;   TEE WITHOUT CARDIOVERSION N/A 03/20/2018   Procedure: TRANSESOPHAGEAL ECHOCARDIOGRAM (TEE);  Surgeon: Nigel Mormon, MD;  Location: [facility] ENDOSCOPY;  Service: Cardiovascular;  Laterality: N/A;   TONSILLECTOMY     URETEROSCOPY WITH HOLMIUM LASER LITHOTRIPSY Right 01/14/2019   Procedure: URETEROSCOPY WITH HOLMIUM LASER LITHOTRIPSY BLADDER BIOPSIES RIGHT STENT PLACEMENT;  Surgeon: Ardis Hughs, MD;  Location: [facility];  Service: Urology;  Laterality: Right;    Social History:  reports that she has never smoked. She has never used smokeless tobacco. She reports that she does not currently use alcohol. She reports that she does not use drugs.  Allergies:  Allergies  Allergen Reactions   Baclofen Other (See Comments)    Somnolence- PUT THE PATIENT INTO A COMA AND "ALMOST KILLED HER"    Tape Other (See Comments)    SKIN WILL TEAR AND BRUISE EASILY!!   Other Other (See Comments)    Patient has a high tolerance to antibiotics- has taken a lot of them  during the course of her life   Codeine Other (See Comments)    Increased pain and couldn't sleep   Dextromethorphan-Guaifenesin Other (See Comments)    Unknown reaction    Medications Prior to Admission  Medication Sig Dispense Refill   acetaminophen (TYLENOL) 325 MG tablet Take 650 mg by mouth every 6 (six) hours as needed for moderate pain or headache.     albuterol (VENTOLIN HFA) 108 (90 Base) MCG/ACT inhaler INHALE 2 PUFFS BY MOUTH EVERY 4 TO 6 HOURS AS NEEDED FOR WHEEZING, SEE ADMIN INSTRUCTIONS (Patient taking differently: Inhale 2 puffs into the lungs every 4 (four) hours as needed for shortness of breath or wheezing.) 9 g 0   B Complex-C-Zn-Folic Acid (DIALYVITE 573 WITH ZINC) 0.8 MG TABS Take 1 tablet by mouth daily.      Biotin 10000 MCG TABS Take 10,000 mcg by mouth daily with breakfast.     calcitRIOL (ROCALTROL) 0.5 MCG capsule Take 0.5 mcg  by mouth daily.     cetirizine (ZYRTEC) 10 MG tablet Take 10 mg by mouth at bedtime.      cinacalcet (SENSIPAR) 60 MG tablet Take 60 mg by mouth 2 (two) times a week. Mon friday     Cyanocobalamin (VITAMIN B12) 1000 MCG TBCR Take 1,000 mcg by mouth daily.     escitalopram (LEXAPRO) 5 MG tablet Take 5 mg by mouth daily.     ferric citrate (AURYXIA) 1 GM 210 MG(Fe) tablet Take 420 mg by mouth 3 (three) times daily with meals.      fluticasone (FLONASE) 50 MCG/ACT nasal spray Place 2 sprays into both nostrils daily as needed for allergies or rhinitis.     HYDROcodone-acetaminophen (NORCO/VICODIN) 5-325 MG tablet Take 1 tablet by mouth every 4 (four) hours as needed for moderate pain.     levothyroxine (SYNTHROID) 75 MCG tablet Take 1 tablet (75 mcg total) by mouth daily before breakfast. 30 tablet 1   Methoxy PEG-Epoetin Beta (MIRCERA IJ) Inject into the skin.     midodrine (PROAMATINE) 5 MG tablet Take 5 mg by mouth 2 (two) times daily.     omeprazole (PRILOSEC) 20 MG capsule TAKE 1 CAPSULE BY MOUTH ONCE DAILY BEFORE BREAKFAST (Patient taking  differently: Take 20 mg by mouth daily.) 30 capsule 0   senna (SENOKOT) 8.6 MG TABS tablet Take 1 tablet by mouth at bedtime.     simvastatin (ZOCOR) 40 MG tablet Take 1 tablet (40 mg total) by mouth at bedtime.     fluticasone (FLOVENT HFA) 110 MCG/ACT inhaler Inhale 1 puff into the lungs 2 (two) times daily. (Patient not taking: Reported on 08/06/2021)     prednisoLONE acetate (PRED FORTE) 1 % ophthalmic suspension Place 1 drop into both eyes 2 (two) times daily. (Patient not taking: Reported on 08/07/2021)     predniSONE (DELTASONE) 20 MG tablet Take 40 mg by mouth daily with breakfast. Tapering dose (Patient not taking: Reported on 08/07/2021)       Physical Exam: Blood pressure (!) 153/108, pulse 85, temperature 97.9 F (36.6 C), temperature source Oral, resp. rate 13, height 5\' 5"  (1.651 m), weight 83.4 kg, SpO2 94 %. General: resting comfortably, appears stated age, no apparent distress Neurological: alert and oriented, no focal deficits, cranial nerves grossly in tact HEENT: normocephalic, atraumatic, oropharynx clear, no scleral icterus CV: regular rate and rhythm, extremities warm and well-perfused Respiratory: normal work of breathing, symmetric chest wall expansion Abdomen: soft, nondistended, mildly tender to palpation throughout the abdomen but no rebound tenderness or guarding. No focal RUQ or epigastric tenderness. No masses or organomegaly. PD catheter in place. Extremities: warm and well-perfused, no deformities, moving all extremities spontaneously Psychiatric: normal mood and affect Skin: warm and dry, no jaundice, no rashes or lesions   Assessment/Plan This is a 75 year old female presenting with hypotension, leukocytosis, and generalized weakness.  She also reports recent postprandial upper abdominal pain.  I personally reviewed her CT scan, which shows small volume intra-abdominal fluid and free air related to peritoneal dialysis, as well as some sludge within the  gallbladder.  There does not appear to be any gallbladder wall thickening or pericholecystic fluid.  The patient's intermittent abdominal pain may be related to biliary colic but she does not have signs of acute cholecystitis on imaging.  She is mildly tender on exam but this is not localized to the RUQ.  I do not think that her gallbladder is the source of her leukocytosis.  If her abdominal pain  persists or worsens, a right upper quadrant ultrasound should be done next to better evaluate the gallbladder for possible acute cholecystitis.  Could also consider evaluation for SBP given that she is on peritoneal dialysis.  Surgery will continue to follow.  Michaelle Birks, MD [facility] Surgery General, Hepatobiliary and Pancreatic Surgery 08/07/21 1:30 AM

## 2021-08-08 ENCOUNTER — Inpatient Hospital Stay (HOSPITAL_COMMUNITY): Payer: Medicare Other

## 2021-08-08 DIAGNOSIS — R6521 Severe sepsis with septic shock: Secondary | ICD-10-CM | POA: Diagnosis not present

## 2021-08-08 DIAGNOSIS — A419 Sepsis, unspecified organism: Secondary | ICD-10-CM | POA: Diagnosis not present

## 2021-08-08 DIAGNOSIS — N186 End stage renal disease: Secondary | ICD-10-CM | POA: Diagnosis not present

## 2021-08-08 DIAGNOSIS — I5032 Chronic diastolic (congestive) heart failure: Secondary | ICD-10-CM | POA: Diagnosis not present

## 2021-08-08 LAB — GLUCOSE, CAPILLARY
Glucose-Capillary: 191 mg/dL — ABNORMAL HIGH (ref 70–99)
Glucose-Capillary: 205 mg/dL — ABNORMAL HIGH (ref 70–99)
Glucose-Capillary: 213 mg/dL — ABNORMAL HIGH (ref 70–99)
Glucose-Capillary: 248 mg/dL — ABNORMAL HIGH (ref 70–99)
Glucose-Capillary: 263 mg/dL — ABNORMAL HIGH (ref 70–99)

## 2021-08-08 LAB — CBC
HCT: 24.8 % — ABNORMAL LOW (ref 36.0–46.0)
Hemoglobin: 8.3 g/dL — ABNORMAL LOW (ref 12.0–15.0)
MCH: 31.9 pg (ref 26.0–34.0)
MCHC: 33.5 g/dL (ref 30.0–36.0)
MCV: 95.4 fL (ref 80.0–100.0)
Platelets: 138 10*3/uL — ABNORMAL LOW (ref 150–400)
RBC: 2.6 MIL/uL — ABNORMAL LOW (ref 3.87–5.11)
RDW: 13.3 % (ref 11.5–15.5)
WBC: 13.1 10*3/uL — ABNORMAL HIGH (ref 4.0–10.5)
nRBC: 0 % (ref 0.0–0.2)

## 2021-08-08 LAB — BASIC METABOLIC PANEL
Anion gap: 16 — ABNORMAL HIGH (ref 5–15)
BUN: 44 mg/dL — ABNORMAL HIGH (ref 8–23)
CO2: 17 mmol/L — ABNORMAL LOW (ref 22–32)
Calcium: 9.4 mg/dL (ref 8.9–10.3)
Chloride: 95 mmol/L — ABNORMAL LOW (ref 98–111)
Creatinine, Ser: 7.87 mg/dL — ABNORMAL HIGH (ref 0.44–1.00)
GFR, Estimated: 5 mL/min — ABNORMAL LOW (ref >60)
Glucose, Bld: 233 mg/dL — ABNORMAL HIGH (ref 70–99)
Potassium: 3.2 mmol/L — ABNORMAL LOW (ref 3.5–5.1)
Sodium: 128 mmol/L — ABNORMAL LOW (ref 135–145)

## 2021-08-08 LAB — PHOSPHORUS: Phosphorus: 5.3 mg/dL — ABNORMAL HIGH (ref 2.5–4.6)

## 2021-08-08 LAB — BODY FLUID CELL COUNT WITH DIFFERENTIAL: Total Nucleated Cell Count, Fluid: 3 cu mm (ref 0–1000)

## 2021-08-08 LAB — MAGNESIUM: Magnesium: 2.2 mg/dL (ref 1.7–2.4)

## 2021-08-08 MED ORDER — POTASSIUM CHLORIDE CRYS ER 20 MEQ PO TBCR
20.0000 meq | EXTENDED_RELEASE_TABLET | Freq: Once | ORAL | Status: AC
  Administered 2021-08-08: 20 meq via ORAL
  Filled 2021-08-08: qty 1

## 2021-08-08 MED ORDER — DELFLEX-LC/2.5% DEXTROSE 394 MOSM/L IP SOLN: INTRAPERITONEAL | Status: DC

## 2021-08-08 MED ORDER — DELFLEX-LC/1.5% DEXTROSE 344 MOSM/L IP SOLN: INTRAPERITONEAL | Status: DC

## 2021-08-08 NOTE — Progress Notes (Signed)
NAME:  GRETTELL RANSDELL, MRN:  [id number], DOB:  1946-05-26, LOS: 2 ADMISSION DATE:  08/06/2021, CONSULTATION DATE:  08/08/21 REFERRING MD:  EDP , CHIEF COMPLAINT:  dizziness   History of Present Illness:  Callee Rohrig is a 75 y.o. with a past medical history of ESRD on peritoneal dialysis, AV block with pacemaker, diabetes, GERD, hyperlipidemia, hypothyroidism, arthritis who presented with a chief complaint of generalized weakness and dizziness.  States that she ran out of her midodrine for over a week secondary to shortages at the pharmacy.  She states she had a syncopal episode yesterday, this is not the first for her was seen for this in 05/2021 with negative head CT.  However she remained dizzy today with abdominal pain after eating and reduced appetite, so presented to the ED.   She was initially hypotensive to 64/51, this improved with 3.5L IVF but MAP remained <65, so peripheral Levophed was initiated. Further work-up revealed initial lactic acid of 3.0 which improved to 2.1 after fluids, potassium 2.7, bicarb 21, anion gap 19, troponin 80, normal bilirubin and LFTs, and white blood cell count of 14 K.  CT abdomen/pelvis with gallbladder stones and layering sludge without evidence of cholecystitis with moderate-severe right-sided hydronephrosis without obstructing calculus.  Of note patient has a known history of both gallbladder stones and current hydronephrosis following ureteral stent removal in 2020   Pertinent  Medical History   has a past medical history of Arthritis, AV block, Mobitz 2 (03/25/2018), CKD (chronic kidney disease), stage IV ([place]), Complication of anesthesia, Diet-controlled diabetes mellitus ([place]), Encounter for care of pacemaker (09/07/2019), End stage renal failure on dialysis [facility]), GERD (gastroesophageal reflux disease), Gout, Heart murmur, High cholesterol, History of COVID-19 (12/2019), Hypothyroidism, Iron deficiency anemia, Migraine, Pneumonia (12/2019), and  Presence of permanent cardiac pacemaker (03/25/2018).   Significant Hospital Events: Including procedures, antibiotic start and stop dates in addition to other pertinent events   9/5, admit to critical care for hypotension, peripheral Levophed initiated   Micro: Blood 9/5 > Urine 9/5 >  ABX: - Cefepime 9/5 >9/6 - Vanco 9/5 >9/6 - Flagyl in ED 9/5  - Zosyn 9/6 >>>  Imaging: - CT abd 9/5 > Moderate severe right-sided hydronephrosis to the level of the ureteropelvic junctionerate severe right-sided hydronephrosis to the level of the ureteropelvic junction. Right renal pelvic calculi.  Bilateral renal calculi. Cholelithiasis and/or gallbladder sludge. Questionable 8 mm nodular density in the left lower lung. - 9/7 HIDA scan reports concern for cystic duct obstruction/acute cholecystitis. RUQ US shows continued layering of gallbladder sludge, possibly small stones, but no sonographic findings of acute cholecystitis.   Interim History / Subjective:  Off pressors  Subjective: Denies chest pain, endorses baseline SOB, has appetite  Objective   Blood pressure (!) 95/35, pulse 63, temperature 98.1 F (36.7 C), temperature source Axillary, resp. rate 19, height 5\' 5"  (1.651 m), weight 83.4 kg, SpO2 99 %.        Intake/Output Summary (Last 24 hours) at 08/08/2021 0750 Last data filed at 08/08/2021 0500 Gross per 24 hour  Intake 2354.44 ml  Output --  Net 2354.44 ml   Filed Weights   08/06/21 1716 08/07/21 0050 08/07/21 0354  Weight: 74.8 kg 83.4 kg 83.4 kg   General:  in bed, NAD, appears comfortable HEENT: MM pink/moist, anicteric, atraumatic Neuro: GCS 15, RASS 0, PERRL 76mm CV: S1S2, A and V paced, no m/r/g appreciated PULM:  clear in the upper lobes and in the lower lobes, Trachea midline,  chest expansion symmetric GI: soft, PD in process, nondistended Extremities: warm/dry, no pretibial edema, capillary refill less than 3 seconds  Skin: no rashes or lesions   Resolved  Hospital Problem list     Assessment & Plan:   Shock, likely septic + hypovolemic with intraabdominal source  Suspect her lightheadedness is associated with hypotension in the setting of decreased p.o. intake and abruptly stopping midodrine.  Question possible developing infection, she has lactic acidosis and leukocytosis, though white count improved from a month ago and has been afebrile.  Patient denies normal PD sessions with no concern for infection. No infiltrate on CXR, no URI symptoms. She tells me her baseline SBP is in the 80s. - Cortisol 10.  - Recent temporal artery biopsy 06/2021 to rule out temporal artery arteritis and was on steroids, (Path benign) P: -Continue midodrine 5mg  TID. -Off levophed. Goal SBP > 72mmHg and unchanged mental status.  -Continue Zosyn. WBC improving. Afebrile. -Continue stress dose steroids today -PO hydration as able -Surgery following. Appreciate assistance. See below. -Follow up BC/UC/BF pending  Postprandial abdominal pain, GERD, cholelithiasis History of chronic dysphagia, seen by GI  several months ago.  Esophagram and MBSS done 2021 were unremarkable.  History of bleeding gastric polyp that was resected and 01/2019, was to follow-up for EGD.  Has a history of cholelithiasis, no evidence of acute cholecystitis on CT and both bili and LFTs are unremarkable. P: -Continue PPI -HIDA scan reports concern for cystic duct obstruction/acute cholecystitis. RUQ US shows continued layering of gallbladder sludge, possibly small stones, but no sonographic findings of acute cholecystitis. Surgery following. Appreciate assistance and comment on imaging. WBC Improving.  -Will need outpatient GI follow up for EGD -Follow up cell count and culture of peritoneal fluid post AM exchanges per neph.  ESRD on peritoneal dialysis Anuric -Neph following. PD per neph.  Hypokalemia Hyponatremia Initially K 2.7, now 3.2. NA 131>128 , denies any issues with continual  dialysis, Suspect psuedohyponatremia from volume status -Given 13meq of K on 9/7 by elink -Check seurm osm in AM -No free h2o -Monitor NA on BMP  AGMA-improving AG 16, suspect secondary to ESRD -On PD -Monitor  Hypothyroidism TSH 1.1184 -Continue synthroid  Chronic dyspnea: seen by Dr. Silas Flood in the pulmonary clinic. With plans for PFT. - FVC 81%, TLC 83%, FEV1 84%, No sig change with bronchodilators. DLCO/VA 83% pred. Consistent with mild obstructive disease.  Lung nodule 51mm  left lower lobe.  P:  -PRN albuterol -Outpatient follow up with Dr. Silas Flood  Hx chronic leg pain -Continue hydrocodone and voltaren  Hyperglycemia BG 252. Suspect secondary to stress dose steroids -Continue SSI sensitive. Monitor. Will consider increase.    Best Practice (right click and "Reselect all SmartList Selections" daily)   Diet/type: Regular consistency (see orders) DVT prophylaxis: prophylactic heparin  GI prophylaxis: PPI Lines: N/A Foley:  N/A Code Status:  full code Last date of multidisciplinary goals of care discussion []   Critical care time: 32 minutes     Redmond School., MSN, APRN, AGACNP-BC [facility]  08/08/2021 , 7:56 AM  Please see [email or web address] for pager details  If no response, please call [phone] After hours, please call Elink at [phone]

## 2021-08-08 NOTE — Progress Notes (Addendum)
[facility] Progress Note  Subjective: seen in room, going for GB surgery tomorrow per pt. HIDA was +. No new c/o.   Vitals:   08/08/21 1100 08/08/21 1200 08/08/21 1300 08/08/21 1400  BP: (!) 97/33 (!) 86/35 (!) 104/46 (!) 108/40  Pulse: 68 69 67   Resp: 16 20 11 19   Temp:      TempSrc:      SpO2: 98% 97% 97%   Weight:      Height:        Exam: General: WDWN; NAD; on RA Lungs: CTA bilaterally. No wheeze, rales or rhonchi. Breathing is unlabored. Heart: RRR. No murmur, rubs or gallops.  Abdomen: soft, nontender, active bowel sounds, no guarding, no rebound tenderness Lower extremities: no edema but noted discoloration bilateral lower extremities Neuro: AAOx3. Moves all extremities spontaneously. Dialysis Access: RUQ PD catheter, clear exit site   Dialysis: [facility] 7x/week, [facility]   6 exchanges, dwell 1.5hrs, fill vol 2800cc, 80kg dry wt    Assessment/Plan: Cholecysitis - +HIDA scan showed non-visualized GB. US showed sludge and stones. On IV abx. Gen surg is planning for lap chole tomorrow.  Syncopal episode/Dizziness/Hypotension- got 3.5 L bolus in ED, getting midodrine here and is now off of pressor support.  ESRD - on nightly PD, will resume here. Will obtain cell count and culture from peritoneal fluid in AM after exchanges are complete. Doubt peritonitis w/ nontender abdomen and clear fluid this am in effluent bag. Not vol overloaded on exam.  Anemia of CKD - Hgb 9.6-monitor trends, may need ESA if indicated. Secondary Hyperparathyroidism - Ca and PO4 okay-monitor trends. Nutrition - Can advance to renal diet with fluid restriction when clinically stable       Rob Bryston Colocho 08/08/2021, 2:58 PM   Recent Labs  Lab 08/07/21 0626 08/08/21 0505  K 3.2* 3.2*  BUN 38* 44*  CREATININE 7.57* 7.87*  CALCIUM 9.1 9.4  PHOS 5.6* 5.3*  HGB 9.6* 8.3*   Inpatient medications:  Chlorhexidine Gluconate Cloth  6 each Topical Q0600   escitalopram  5 mg Oral Daily    gentamicin cream  1 application Topical Daily   heparin  5,000 Units Subcutaneous Q8H   hydrocortisone sod succinate (SOLU-CORTEF) inj  100 mg Intravenous Q12H   insulin aspart  0-6 Units Subcutaneous TID AC & HS   levothyroxine  75 mcg Oral Q0600   midodrine  5 mg Oral TID WC   pantoprazole  40 mg Oral QHS   senna  1 tablet Oral QHS   simvastatin  40 mg Oral q1800    sodium chloride Stopped (08/08/21 1052)   dialysis solution 1.5% low-MG/low-CA     norepinephrine (LEVOPHED) Adult infusion Stopped (08/08/21 0435)   piperacillin-tazobactam 2.25 g (08/08/21 1420)   diclofenac Sodium, docusate sodium, dianeal solution for CAPD/CCPD with heparin, HYDROcodone-acetaminophen, polyethylene glycol

## 2021-08-08 NOTE — Progress Notes (Signed)
Progress Note     Subjective: As previous she states she has right upper quadrant pain after eating. She is feeling better today and denies nausea/emesis, abdominal pain. No respiratory complaints at rest   Objective: Vital signs in last 24 hours: Temp:  [98 F (36.7 C)-98.1 F (36.7 C)] 98.1 F (36.7 C) (09/07 0700) Pulse Rate:  [62-81] 68 (09/07 0800) Resp:  [10-27] 21 (09/07 0800) BP: (75-122)/(26-83) 106/48 (09/07 0800) SpO2:  [88 %-100 %] 100 % (09/07 0800) Last BM Date: 08/07/21  Intake/Output from previous day: 09/06 0701 - 09/07 0700 In: 2354.4 [P.O.:840; I.V.:1264.4; IV Piggyback:250] Out: -  Intake/Output this shift: No intake/output data recorded.  PE: General: pleasant, WD, female who is laying in bed in NAD HEENT: head is normocephalic, atraumatic. Mouth is pink and moist Heart: Palpable radial and pedal pulses bilaterally Lungs: Respiratory effort nonlabored Abd: soft, ND, +BS, no masses, hernias, or organomegaly. Mild diffuse TTP MSK: all 4 extremities are symmetrical with no cyanosis, clubbing, or edema. Skin: warm and dry with no masses, lesions, or rashes Psych: A&Ox3 with an appropriate affect.    Lab Results:  Recent Labs    08/07/21 0626 08/08/21 0505  WBC 17.8* 13.1*  HGB 9.6* 8.3*  HCT 29.0* 24.8*  PLT 160 138*   BMET Recent Labs    08/07/21 0626 08/08/21 0505  NA 131* 128*  K 3.2* 3.2*  CL 99 95*  CO2 18* 17*  GLUCOSE 197* 233*  BUN 38* 44*  CREATININE 7.57* 7.87*  CALCIUM 9.1 9.4   PT/INR Recent Labs    08/06/21 1725  LABPROT 13.3  INR 1.0   CMP     Component Value Date/Time   NA 128 (L) 08/08/2021 0505   NA 134 07/02/2018 1409   K 3.2 (L) 08/08/2021 0505   CL 95 (L) 08/08/2021 0505   CO2 17 (L) 08/08/2021 0505   GLUCOSE 233 (H) 08/08/2021 0505   BUN 44 (H) 08/08/2021 0505   BUN 48 (H) 07/02/2018 1409   CREATININE 7.87 (H) 08/08/2021 0505   CREATININE 3.24 (H) 12/21/2018 1029   CALCIUM 9.4 08/08/2021 0505    CALCIUM 9.6 10/31/2017 1050   PROT 6.1 (L) 08/06/2021 1725   ALBUMIN 2.4 (L) 08/06/2021 1725   AST 19 08/06/2021 1725   ALT 13 08/06/2021 1725   ALKPHOS 126 08/06/2021 1725   BILITOT 0.7 08/06/2021 1725   GFRNONAA 5 (L) 08/08/2021 0505   GFRAA 6 (L) 02/02/2020 2138   Lipase     Component Value Date/Time   LIPASE 28 08/06/2021 1725       Studies/Results: CT ABDOMEN PELVIS WO CONTRAST  Result Date: 08/06/2021 CLINICAL DATA:  Acute abdominal pain. History of peritoneal dialysis. EXAM: CT ABDOMEN AND PELVIS WITHOUT CONTRAST TECHNIQUE: Multidetector CT imaging of the abdomen and pelvis was performed following the standard protocol without IV contrast. COMPARISON:  CT abdomen and pelvis 05/10/2019. FINDINGS: Lower chest: There is a stable 4 mm nodular density in the left lower lobe image 5/5. There is linear scarring or atelectasis in both lung bases. Cannot exclude nodular density in the left lower lung measuring 8 mm image 5/1. Leads are partially visualized in the heart. Hepatobiliary: There is gallbladder sludge versus small layering stones within the gallbladder. There is no biliary ductal dilatation identified. The liver appears within normal limits without focal lesion allowing for lack of intravenous contrast. Pancreas: Unremarkable. No pancreatic ductal dilatation or surrounding inflammatory changes. Spleen: Normal in size without focal abnormality. Adrenals/Urinary  Tract: The bilateral adrenal glands appear within normal limits. There is mild bilateral renal atrophy which has progressed compared to the prior study. There are nonobstructing left renal calculi measuring up to 4 mm. There is a subcentimeter fat containing lesion in the left kidney, unchanged, favored is angiomyolipoma. There is no left-sided hydronephrosis. The left ureter is within normal limits. There is moderate/severe right-sided hydronephrosis to the level of the ureteropelvic junction. There are numerous calculi within  the right renal pelvis measuring up to 4 mm. There are additional punctate right renal calculi. No obstructing calculi are identified at the right UPJ or right ureter. The bladder is completely decompressed and not well evaluated. Stomach/Bowel: Stomach is within normal limits. Appendix is not seen. No evidence of bowel wall thickening, distention, or inflammatory changes. Vascular/Lymphatic: Aortic atherosclerosis. No enlarged abdominal or pelvic lymph nodes. Reproductive: Uterus and bilateral adnexa are unremarkable. Other: There is a small amount of free fluid in free air throughout the abdomen and pelvis, presumably from peritoneal dialysis. Peritoneal dialysis catheter is seen entering the anterior abdominal wall with distal tip of the catheter in the left pelvis. The visualized portion of the catheter appears intact. There is no focal abdominal wall hernia. Musculoskeletal: Degenerative changes affect the spine. No acute fractures are seen. IMPRESSION: 1. Small amount of free fluid and free air throughout the abdomen and pelvis, presumably secondary to peritoneal dialysis. 2. Moderate severe right-sided hydronephrosis to the level of the ureteropelvic junction. No obstructing calculus identified. Obstruction may be related to stricture or mass at the UPJ. 3. Right renal pelvic calculi.  Bilateral renal calculi. 4. Cholelithiasis and/or gallbladder sludge. 5. Questionable 8 mm nodular density in the left lower lung, incompletely imaged. Follow-up nonemergent chest CT recommended. Electronically Signed   By: Ronney Asters M.D.   On: 08/06/2021 18:35   NM Hepatobiliary Liver Func  Result Date: 08/07/2021 CLINICAL DATA:  Sepsis abdominal pain EXAM: NUCLEAR MEDICINE HEPATOBILIARY IMAGING TECHNIQUE: Sequential images of the abdomen were obtained out to 60 minutes following intravenous administration of radiopharmaceutical. Delayed 4 hour images obtained. RADIOPHARMACEUTICALS:  5.5 mCi Tc-61m  Choletec IV  COMPARISON:  CT 08/06/2021, ultrasound 08/18/2020 FINDINGS: Prompt uptake and biliary excretion of activity by the liver is seen. No gallbladder activity is visualized during the first hour of imaging, no gallbladder activity visualized on 4 hour delayed image. Normal biliary to bowel transit. IMPRESSION: Nonvisualized gallbladder after 4 hours of imaging, raising concern for cystic duct obstruction/acute cholecystitis. Note that false positives may occur in the setting of prolonged fasting, TPN, and severe liver disease. No recent ultrasound imaging which might be helpful for further evaluation. These results will be called to the ordering clinician or representative by the Radiologist Assistant, and communication documented in the PACS or [employer]. Electronically Signed   By: Donavan Foil M.D.   On: 08/07/2021 21:05   DG Chest Port 1 View  Result Date: 08/06/2021 CLINICAL DATA:  Sepsis protocol EXAM: PORTABLE CHEST 1 VIEW COMPARISON:  Portable exam 1818 hours compared to 05/29/2021 FINDINGS: LEFT subclavian sequential transvenous pacemaker leads project at RIGHT atrium and RIGHT ventricle. Enlargement of cardiac silhouette. Mediastinal contours and pulmonary vascularity normal. Atherosclerotic calcification aorta. Bronchitic changes without infiltrate, pleural effusion, or pneumothorax. Bones demineralized. IMPRESSION: Enlargement of cardiac silhouette post pacemaker. Bronchitic changes without acute infiltrate. Aortic Atherosclerosis (ICD10-I70.0). Electronically Signed   By: Lavonia Dana M.D.   On: 08/06/2021 18:32   US Abdomen Limited RUQ (LIVER/GB)  Result Date: 08/07/2021 CLINICAL DATA:  Gallbladder disease. EXAM: ULTRASOUND ABDOMEN LIMITED RIGHT UPPER QUADRANT COMPARISON:  Nuclear medicine hepatobiliary scan earlier today. CT 08/06/2021. Abdominal ultrasound 08/18/2020 FINDINGS: Gallbladder: Physiologically distended. Layering sludge and possibly small stones. No gallbladder wall thickening. No  sonographic Murphy sign noted by sonographer. Common bile duct: Diameter: 4-5 mm. Liver: No intrahepatic biliary ductal dilatation. No focal lesion identified. Within normal limits in parenchymal echogenicity. Portal vein is patent on color Doppler imaging with normal direction of blood flow towards the liver. Other: Right hydronephrosis as seen on CT yesterday. Small amount of right upper quadrant free fluid is seen on CT yesterday. IMPRESSION: 1. Layering gallbladder sludge and possibly small stones. 2. No sonographic findings of acute cholecystitis. No biliary dilatation or abnormal gallbladder distension. Electronically Signed   By: Keith Rake M.D.   On: 08/07/2021 22:45    Anti-infectives: Anti-infectives (From admission, onward)    Start     Dose/Rate Route Frequency Ordered Stop   08/07/21 1730  ceFEPIme (MAXIPIME) 1 g in sodium chloride 0.9 % 100 mL IVPB  Status:  Discontinued        1 g 200 mL/hr over 30 Minutes Intravenous Every 24 hours 08/06/21 1734 08/07/21 0724   08/07/21 1300  piperacillin-tazobactam (ZOSYN) IVPB 2.25 g        2.25 g 100 mL/hr over 30 Minutes Intravenous Every 8 hours 08/07/21 1212     08/07/21 0830  ceFEPIme (MAXIPIME) 1 g in sodium chloride 0.9 % 100 mL IVPB  Status:  Discontinued        1 g 200 mL/hr over 30 Minutes Intravenous Every 24 hours 08/07/21 0724 08/07/21 1212   08/06/21 1830  vancomycin (VANCOREADY) IVPB 1750 mg/350 mL        1,750 mg 175 mL/hr over 120 Minutes Intravenous  Once 08/06/21 1727 08/06/21 2054   08/06/21 1734  vancomycin variable dose per unstable renal function (pharmacist dosing)  Status:  Discontinued         Does not apply See admin instructions 08/06/21 1734 08/07/21 1212   08/06/21 1730  ceFEPIme (MAXIPIME) 2 g in sodium chloride 0.9 % 100 mL IVPB  Status:  Discontinued        2 g 200 mL/hr over 30 Minutes Intravenous  Once 08/06/21 1727 08/06/21 1734   08/06/21 1730  metroNIDAZOLE (FLAGYL) IVPB 500 mg        500 mg 100  mL/hr over 60 Minutes Intravenous  Once 08/06/21 1727 08/06/21 1927        Assessment/Plan Symptomatic cholelithiasis/Cholecystitis - HIDA 9/6 with nonvisualized GB. [place] with sludge and stones. She does have post prandial symptoms as well - WBC 13.1 (17.8), BCX no growth 2 days. Paracentesis culture pending Patient has been considering elective cholecystectomy for some time and we discussed given her HIDA and current illness would recommend with proceeding with intervention sooner rather than later. Will tentatively plan for laparoscopic cholecystectomy tomorrow pending continued medical improvement (now off levophed but undergoing steroid taper) I have explained the procedure, risks, and aftercare of cholecystectomy.  Risks include but are not limited to bleeding, infection, wound problems, anesthesia, diarrhea, bile leak, injury to common bile duct/liver/intestine. She seems to understand and agrees to proceed. We also discussed the concern for mixed etiology of her sepsis with CCM continuing work up and paracentesis fluid culture pending.    FEN: carb mod, NPO MN for OR ID: [id number] 9/6>> VTE: heparin subq  Shock GERD ESRD on PD Hypothyroidism Chronic dyspnea    LOS: 2 days  Winferd Humphrey, [facility] Surgery 08/08/2021, 9:29 AM Please see Amion for pager number during day hours 7:00am-4:30pm

## 2021-08-08 NOTE — Progress Notes (Signed)
eLink Physician-Brief Progress Note Patient Name: Erika Fry DOB: [birth year] MRN: [id number]   Date of Service  08/08/2021  HPI/Events of Note  K+ 3.2, GFR 5.  eICU Interventions  KCL 20 meq po x 1 ordered.        Kerry Kass [name] 08/08/2021, 6:59 AM

## 2021-08-08 NOTE — Progress Notes (Signed)
Dear Doctor: This patient has been identified as a candidate for CVC for the following reason (s): poor vasculature, multiple PIV infiltrated, renal function. NOT candidate for PICC or Midline. If you agree, please write an order for the indicated device.   Thank you for supporting the early vascular access assessment program.

## 2021-08-09 ENCOUNTER — Inpatient Hospital Stay (HOSPITAL_COMMUNITY): Payer: Medicare Other | Admitting: Anesthesiology

## 2021-08-09 ENCOUNTER — Encounter (HOSPITAL_COMMUNITY)
Admission: EM | Disposition: A | Discharge status: Skilled Nursing Facility | Payer: Self-pay | Source: Home / Self Care | Attending: Internal Medicine

## 2021-08-09 ENCOUNTER — Encounter (HOSPITAL_COMMUNITY): Payer: Self-pay | Admitting: Pulmonary Disease

## 2021-08-09 HISTORY — PX: CHOLECYSTECTOMY: SHX55

## 2021-08-09 LAB — GLUCOSE, CAPILLARY
Glucose-Capillary: 230 mg/dL — ABNORMAL HIGH (ref 70–99)
Glucose-Capillary: 187 mg/dL — ABNORMAL HIGH (ref 70–99)
Glucose-Capillary: 176 mg/dL — ABNORMAL HIGH (ref 70–99)
Glucose-Capillary: 152 mg/dL — ABNORMAL HIGH (ref 70–99)
Glucose-Capillary: 163 mg/dL — ABNORMAL HIGH (ref 70–99)
Glucose-Capillary: 166 mg/dL — ABNORMAL HIGH (ref 70–99)
Glucose-Capillary: 131 mg/dL — ABNORMAL HIGH (ref 70–99)

## 2021-08-09 LAB — BASIC METABOLIC PANEL
Anion gap: 16 — ABNORMAL HIGH (ref 5–15)
BUN: 41 mg/dL — ABNORMAL HIGH (ref 8–23)
CO2: 17 mmol/L — ABNORMAL LOW (ref 22–32)
Calcium: 9.5 mg/dL (ref 8.9–10.3)
Chloride: 96 mmol/L — ABNORMAL LOW (ref 98–111)
Creatinine, Ser: 7.14 mg/dL — ABNORMAL HIGH (ref 0.44–1.00)
GFR, Estimated: 5 mL/min — ABNORMAL LOW (ref >60)
Glucose, Bld: 246 mg/dL — ABNORMAL HIGH (ref 70–99)
Potassium: 3.2 mmol/L — ABNORMAL LOW (ref 3.5–5.1)
Sodium: 129 mmol/L — ABNORMAL LOW (ref 135–145)

## 2021-08-09 LAB — CBC
HCT: 24.7 % — ABNORMAL LOW (ref 36.0–46.0)
Hemoglobin: 8.4 g/dL — ABNORMAL LOW (ref 12.0–15.0)
MCH: 31.9 pg (ref 26.0–34.0)
MCHC: 34 g/dL (ref 30.0–36.0)
MCV: 93.9 fL (ref 80.0–100.0)
Platelets: 159 10*3/uL (ref 150–400)
RBC: 2.63 MIL/uL — ABNORMAL LOW (ref 3.87–5.11)
RDW: 13.2 % (ref 11.5–15.5)
WBC: 12 10*3/uL — ABNORMAL HIGH (ref 4.0–10.5)
nRBC: 0 % (ref 0.0–0.2)

## 2021-08-09 LAB — HEPATITIS B CORE ANTIBODY, IGM: Hep B C IgM: NONREACTIVE

## 2021-08-09 LAB — HEPATITIS B SURFACE ANTIGEN: Hepatitis B Surface Ag: NONREACTIVE

## 2021-08-09 LAB — MAGNESIUM: Magnesium: 1.9 mg/dL (ref 1.7–2.4)

## 2021-08-09 LAB — PHOSPHORUS: Phosphorus: 5.3 mg/dL — ABNORMAL HIGH (ref 2.5–4.6)

## 2021-08-09 LAB — HEPATITIS B SURFACE ANTIBODY,QUALITATIVE: Hep B S Ab: NONREACTIVE

## 2021-08-09 LAB — OSMOLALITY: Osmolality: 286 mOsm/kg (ref 275–295)

## 2021-08-09 SURGERY — LAPAROSCOPIC CHOLECYSTECTOMY
Anesthesia: General | Site: Abdomen

## 2021-08-09 MED ORDER — ROCURONIUM BROMIDE 10 MG/ML (PF) SYRINGE
PREFILLED_SYRINGE | INTRAVENOUS | Status: AC
  Filled 2021-08-09: qty 10

## 2021-08-09 MED ORDER — ZINC SULFATE 220 (50 ZN) MG PO CAPS
220.0000 mg | ORAL_CAPSULE | Freq: Two times a day (BID) | ORAL | Status: DC
  Administered 2021-08-09 – 2021-08-17 (×16): 220 mg via ORAL
  Filled 2021-08-09 (×17): qty 1

## 2021-08-09 MED ORDER — SODIUM CHLORIDE 0.9 % IV SOLN: INTRAVENOUS | Status: DC

## 2021-08-09 MED ORDER — DEXAMETHASONE SODIUM PHOSPHATE 10 MG/ML IJ SOLN
INTRAMUSCULAR | Status: DC | PRN
  Administered 2021-08-09: 4 mg via INTRAVENOUS

## 2021-08-09 MED ORDER — ORAL CARE MOUTH RINSE: 15.0000 mL | Freq: Once | OROMUCOSAL | Status: AC

## 2021-08-09 MED ORDER — OXYCODONE HCL 5 MG PO TABS
5.0000 mg | ORAL_TABLET | ORAL | Status: DC | PRN
  Administered 2021-08-09: 5 mg via ORAL
  Administered 2021-08-10: 10 mg via ORAL
  Administered 2021-08-10: 5 mg via ORAL
  Administered 2021-08-11: 10 mg via ORAL
  Filled 2021-08-09: qty 2
  Filled 2021-08-09 (×2): qty 1
  Filled 2021-08-09: qty 2

## 2021-08-09 MED ORDER — ASCORBIC ACID 500 MG PO TABS
500.0000 mg | ORAL_TABLET | Freq: Two times a day (BID) | ORAL | Status: DC
  Administered 2021-08-09 – 2021-08-17 (×16): 500 mg via ORAL
  Filled 2021-08-09 (×17): qty 1

## 2021-08-09 MED ORDER — ACETAMINOPHEN 325 MG PO TABS: 650.0000 mg | ORAL_TABLET | Freq: Once | ORAL | Status: AC

## 2021-08-09 MED ORDER — FENTANYL CITRATE (PF) 100 MCG/2ML IJ SOLN
12.5000 ug | INTRAMUSCULAR | Status: DC | PRN
Start: 2021-08-09 — End: 2021-08-11
  Administered 2021-08-09 (×2): 12.5 ug via INTRAVENOUS
  Filled 2021-08-09: qty 2

## 2021-08-09 MED ORDER — AMISULPRIDE (ANTIEMETIC) 5 MG/2ML IV SOLN
10.0000 mg | Freq: Once | INTRAVENOUS | Status: AC
  Administered 2021-08-09: 10 mg via INTRAVENOUS

## 2021-08-09 MED ORDER — ONDANSETRON HCL 4 MG/2ML IJ SOLN
INTRAMUSCULAR | Status: AC
  Filled 2021-08-09: qty 2

## 2021-08-09 MED ORDER — GLYCOPYRROLATE PF 0.2 MG/ML IJ SOSY
PREFILLED_SYRINGE | INTRAMUSCULAR | Status: DC | PRN
  Administered 2021-08-09: .4 mg via INTRAVENOUS

## 2021-08-09 MED ORDER — PHENYLEPHRINE 40 MCG/ML (10ML) SYRINGE FOR IV PUSH (FOR BLOOD PRESSURE SUPPORT)
PREFILLED_SYRINGE | INTRAVENOUS | Status: DC | PRN
  Administered 2021-08-09: 160 ug via INTRAVENOUS

## 2021-08-09 MED ORDER — LIDOCAINE 2% (20 MG/ML) 5 ML SYRINGE
INTRAMUSCULAR | Status: DC | PRN
  Administered 2021-08-09: 60 mg via INTRAVENOUS

## 2021-08-09 MED ORDER — PROPOFOL 10 MG/ML IV BOLUS
INTRAVENOUS | Status: AC
  Filled 2021-08-09: qty 20

## 2021-08-09 MED ORDER — BUPIVACAINE HCL (PF) 0.25 % IJ SOLN
INTRAMUSCULAR | Status: AC
  Filled 2021-08-09: qty 30

## 2021-08-09 MED ORDER — FENTANYL CITRATE (PF) 250 MCG/5ML IJ SOLN
INTRAMUSCULAR | Status: AC
  Filled 2021-08-09: qty 5

## 2021-08-09 MED ORDER — VITAMIN A 3 MG (10000 UNIT) PO CAPS
10000.0000 [IU] | ORAL_CAPSULE | Freq: Every day | ORAL | Status: DC
  Administered 2021-08-09 – 2021-08-17 (×9): 10000 [IU] via ORAL
  Filled 2021-08-09 (×9): qty 1

## 2021-08-09 MED ORDER — CHLORHEXIDINE GLUCONATE CLOTH 2 % EX PADS
6.0000 | MEDICATED_PAD | Freq: Every day | CUTANEOUS | Status: DC
  Administered 2021-08-10 – 2021-08-16 (×5): 6 via TOPICAL

## 2021-08-09 MED ORDER — ACETAMINOPHEN 325 MG PO TABS
650.0000 mg | ORAL_TABLET | Freq: Four times a day (QID) | ORAL | Status: DC
  Administered 2021-08-09 – 2021-08-17 (×22): 650 mg via ORAL
  Filled 2021-08-09 (×22): qty 2

## 2021-08-09 MED ORDER — 0.9 % SODIUM CHLORIDE (POUR BTL) OPTIME
TOPICAL | Status: DC | PRN
  Administered 2021-08-09: 1000 mL

## 2021-08-09 MED ORDER — HYDROCODONE-ACETAMINOPHEN 5-325 MG PO TABS
1.0000 | ORAL_TABLET | ORAL | Status: DC | PRN
Start: 2021-08-09 — End: 2021-08-11
  Administered 2021-08-10 – 2021-08-11 (×3): 1 via ORAL
  Filled 2021-08-09 (×3): qty 1

## 2021-08-09 MED ORDER — FENTANYL CITRATE (PF) 100 MCG/2ML IJ SOLN
25.0000 ug | INTRAMUSCULAR | Status: DC | PRN
  Administered 2021-08-09 (×2): 50 ug via INTRAVENOUS

## 2021-08-09 MED ORDER — ACETAMINOPHEN 500 MG PO TABS: 1000.0000 mg | ORAL_TABLET | Freq: Once | ORAL | Status: DC

## 2021-08-09 MED ORDER — LIDOCAINE 2% (20 MG/ML) 5 ML SYRINGE
INTRAMUSCULAR | Status: AC
  Filled 2021-08-09: qty 5

## 2021-08-09 MED ORDER — PROPOFOL 10 MG/ML IV BOLUS
INTRAVENOUS | Status: DC | PRN
  Administered 2021-08-09: 30 mg via INTRAVENOUS
  Administered 2021-08-09: 70 mg via INTRAVENOUS

## 2021-08-09 MED ORDER — SODIUM CHLORIDE 0.9 % IR SOLN
Status: DC | PRN
  Administered 2021-08-09: 1000 mL

## 2021-08-09 MED ORDER — DEXAMETHASONE SODIUM PHOSPHATE 10 MG/ML IJ SOLN
INTRAMUSCULAR | Status: AC
  Filled 2021-08-09: qty 1

## 2021-08-09 MED ORDER — ACETAMINOPHEN 325 MG PO TABS
ORAL_TABLET | ORAL | Status: AC
  Administered 2021-08-09: 650 mg via ORAL
  Filled 2021-08-09: qty 2

## 2021-08-09 MED ORDER — CHLORHEXIDINE GLUCONATE 0.12 % MT SOLN: 15.0000 mL | Freq: Once | OROMUCOSAL | Status: AC

## 2021-08-09 MED ORDER — INSULIN ASPART 100 UNIT/ML IJ SOLN
0.0000 [IU] | INTRAMUSCULAR | Status: DC
  Administered 2021-08-09 (×3): 1 [IU] via SUBCUTANEOUS

## 2021-08-09 MED ORDER — ROCURONIUM BROMIDE 10 MG/ML (PF) SYRINGE
PREFILLED_SYRINGE | INTRAVENOUS | Status: DC | PRN
  Administered 2021-08-09: 70 mg via INTRAVENOUS

## 2021-08-09 MED ORDER — ACETAMINOPHEN 500 MG PO TABS
ORAL_TABLET | ORAL | Status: AC
  Filled 2021-08-09: qty 2

## 2021-08-09 MED ORDER — AMISULPRIDE (ANTIEMETIC) 5 MG/2ML IV SOLN
INTRAVENOUS | Status: AC
  Filled 2021-08-09: qty 4

## 2021-08-09 MED ORDER — FENTANYL CITRATE (PF) 250 MCG/5ML IJ SOLN
INTRAMUSCULAR | Status: DC | PRN
  Administered 2021-08-09 (×3): 50 ug via INTRAVENOUS

## 2021-08-09 MED ORDER — FENTANYL CITRATE (PF) 100 MCG/2ML IJ SOLN
INTRAMUSCULAR | Status: AC
  Filled 2021-08-09: qty 2

## 2021-08-09 MED ORDER — BUPIVACAINE HCL 0.25 % IJ SOLN
INTRAMUSCULAR | Status: DC | PRN
  Administered 2021-08-09: 30 mL

## 2021-08-09 MED ORDER — ONDANSETRON HCL 4 MG/2ML IJ SOLN
INTRAMUSCULAR | Status: DC | PRN
  Administered 2021-08-09: 4 mg via INTRAVENOUS

## 2021-08-09 MED ORDER — CHLORHEXIDINE GLUCONATE 0.12 % MT SOLN
OROMUCOSAL | Status: AC
  Administered 2021-08-09: 15 mL via OROMUCOSAL
  Filled 2021-08-09: qty 15

## 2021-08-09 MED ORDER — NEOSTIGMINE METHYLSULFATE 3 MG/3ML IV SOSY
PREFILLED_SYRINGE | INTRAVENOUS | Status: DC | PRN
  Administered 2021-08-09: 3 mg via INTRAVENOUS

## 2021-08-09 MED ORDER — HYDROCORTISONE NA SUCCINATE PF 100 MG IJ SOLR: 50.0000 mg | Freq: Every day | INTRAMUSCULAR | Status: DC

## 2021-08-09 MED ORDER — ONDANSETRON HCL 4 MG/2ML IJ SOLN
4.0000 mg | Freq: Four times a day (QID) | INTRAMUSCULAR | Status: DC | PRN
  Administered 2021-08-09 – 2021-08-13 (×3): 4 mg via INTRAVENOUS
  Filled 2021-08-09 (×3): qty 2

## 2021-08-09 SURGICAL SUPPLY — 39 items
APPLIER CLIP 5 13 M/L LIGAMAX5 (MISCELLANEOUS) ×2
BLADE CLIPPER SURG (BLADE) IMPLANT
CANISTER SUCT 3000ML PPV (MISCELLANEOUS) ×2 IMPLANT
CHLORAPREP W/TINT 26 (MISCELLANEOUS) ×2 IMPLANT
CLIP APPLIE 5 13 M/L LIGAMAX5 (MISCELLANEOUS) ×1 IMPLANT
COVER SURGICAL LIGHT HANDLE (MISCELLANEOUS) ×2 IMPLANT
DERMABOND ADVANCED (GAUZE/BANDAGES/DRESSINGS) ×1
DERMABOND ADVANCED .7 DNX12 (GAUZE/BANDAGES/DRESSINGS) ×1 IMPLANT
DISSECTOR BLUNT TIP ENDO 5MM (MISCELLANEOUS) ×2 IMPLANT
ELECT CAUTERY BLADE 6.4 (BLADE) ×2 IMPLANT
ELECT REM PT RETURN 9FT ADLT (ELECTROSURGICAL) ×2
ELECTRODE REM PT RTRN 9FT ADLT (ELECTROSURGICAL) ×1 IMPLANT
GLOVE SURG ENC MOIS LTX SZ6.5 (GLOVE) ×2 IMPLANT
GLOVE SURG UNDER POLY LF SZ6 (GLOVE) ×2 IMPLANT
GOWN STRL REUS W/ TWL LRG LVL3 (GOWN DISPOSABLE) ×3 IMPLANT
GOWN STRL REUS W/TWL LRG LVL3 (GOWN DISPOSABLE) ×6
HEMOSTAT SNOW SURGICEL 2X4 (HEMOSTASIS) ×2 IMPLANT
KIT BASIN OR (CUSTOM PROCEDURE TRAY) ×2 IMPLANT
KIT TURNOVER KIT B (KITS) ×2 IMPLANT
NS IRRIG 1000ML POUR BTL (IV SOLUTION) ×2 IMPLANT
PAD ARMBOARD 7.5X6 YLW CONV (MISCELLANEOUS) ×2 IMPLANT
PENCIL BUTTON HOLSTER BLD 10FT (ELECTRODE) ×2 IMPLANT
POUCH RETRIEVAL ECOSAC 10 (ENDOMECHANICALS) ×1 IMPLANT
POUCH RETRIEVAL ECOSAC 10MM (ENDOMECHANICALS) ×2
POUCH SPECIMEN RETRIEVAL 10MM (ENDOMECHANICALS) ×2 IMPLANT
SCISSORS LAP 5X35 DISP (ENDOMECHANICALS) ×2 IMPLANT
SET IRRIG TUBING LAPAROSCOPIC (IRRIGATION / IRRIGATOR) IMPLANT
SET TUBE SMOKE EVAC HIGH FLOW (TUBING) ×2 IMPLANT
SLEEVE ENDOPATH XCEL 5M (ENDOMECHANICALS) ×4 IMPLANT
SUT MNCRL AB 4-0 PS2 18 (SUTURE) ×2 IMPLANT
SUT VIC AB 0 CT1 27 (SUTURE)
SUT VIC AB 0 CT1 27XBRD ANBCTR (SUTURE) IMPLANT
SUT VIC AB 0 UR5 27 (SUTURE) IMPLANT
SUT VICRYL 0 AB UR-6 (SUTURE) IMPLANT
TOWEL GREEN STERILE FF (TOWEL DISPOSABLE) ×2 IMPLANT
TRAY LAPAROSCOPIC MC (CUSTOM PROCEDURE TRAY) ×2 IMPLANT
TROCAR XCEL BLUNT TIP 100MML (ENDOMECHANICALS) ×2 IMPLANT
TROCAR XCEL NON-BLD 5MMX100MML (ENDOMECHANICALS) ×2 IMPLANT
WATER STERILE IRR 1000ML POUR (IV SOLUTION) ×2 IMPLANT

## 2021-08-09 NOTE — Op Note (Signed)
   Operative Note  Date: 08/09/2021  Procedure: laparoscopic cholecystectomy  Pre-op diagnosis: acute cholecystitis Post-op diagnosis: grade 3 cholecystitis  Indication and clinical history: The patient is a 75 y.o. year old female with acute cholecystitis  Surgeon: Jesusita Oka, MD Assistant: Anice Paganini, [place]  Anesthesiologist: Lanetta Inch, MD Anesthesia: General  Findings:  Specimen: gallbladder EBL: 5cc Drains/Implants: none  Disposition: PACU - hemodynamically stable.  Description of procedure: The patient was positioned supine on the operating room table. Time-out was performed verifying correct patient, procedure, signature of informed consent, and administration of pre-operative antibiotics. General anesthetic induction and intubation were uneventful. The abdomen was prepped and draped in the usual sterile fashion. An infra-umbilical incision was made using an open technique using zero vicryl stay sutures on either side of the fascia and a 39mm Hassan port inserted. After establishing pneumoperitoneum, which the patient tolerated well, the abdominal cavity was inspected and no injury of any intra-abdominal structures was identified. Additional ports were placed under direct visualization and using local anesthetic: two 58mm ports in the right subcostal region and a 28mm port in the epigastric region. The patient was re-positioned to reverse Trendelenburg and right side up. Adhesiolysis was performed to take down the omental window through which the PD catheter passed. The gallbladder was then exposed and retracted cephalad. The infundibulum was identified and retracted toward the right lower quadrant. The peritoneum was incised over the infundibulum and the triangle of Calot dissected to expose the critical view of safety. With clear identification and isolation of the cystic duct and cystic artery, the cystic artery was doubly clipped and divided. After this, the cystic duct was identified as  a single structure entering the gallbladder, and was also doubly clipped and divided. The gallbladder was dissected off the liver bed using electrocautery and hemostasis of the liver bed was confirmed prior to separation of the final peritoneal attachments of the gallbladder to the liver bed. There was a small amount of oozing near the clips and a piece of surgical was placed in the gallbladder fossa. Moderate serous fluid was suctioned from the right upper quadrant. The gallbladder was placed in an endoscopic specimen retrieval bag, removed via the umbilical port site, and sent to pathology as a permanent specimen. The gallbladder fossa was inspected confirming hemostasis, the absence of bile leakage from the cystic duct stump, and correct placement of clips on the cystic artery and cystic duct stumps. The abdomen was desufflated and the fascia of the umbilical port site was closed using the previously placed stay sutures. Additional local anesthetic was administered at the umbilical port site.  The skin of all incisions was closed with 4-0 monocryl. Sterile dressings were applied. All sponge and instrument counts were correct at the conclusion of the procedure. The patient was awakened from anesthesia, extubated uneventfully, and transported to the PACU - hemodynamically stable. There were no complications.    Upon entering the abdomen (organ space), I encountered infection of the gallbladder .  CASE DATA:  Type of patient?: DOW CASE (Surgical Hospitalist [facility])  Status of Case? URGENT Add On  Infection Present At Time Of Surgery (PATOS)?  INFECTION of the gallbladder    Jesusita Oka, MD General and [facility]

## 2021-08-09 NOTE — Anesthesia Preprocedure Evaluation (Addendum)
Anesthesia Evaluation  Patient identified by MRN, date of birth, ID band Patient awake    Reviewed: Allergy & Precautions, NPO status , Patient's Chart, lab work & pertinent test results  Airway Mallampati: IV  TM Distance: >3 FB Neck ROM: Full  Mouth opening: Limited Mouth Opening  Dental no notable dental hx. (+) Teeth Intact, Dental Advisory Given   Pulmonary neg pulmonary ROS,    Pulmonary exam normal breath sounds clear to auscultation       Cardiovascular +CHF  Normal cardiovascular exam+ dysrhythmias (PPM for 2nd degree AV block, Type II) + pacemaker  Rhythm:Regular Rate:Normal  TTE 2020 LVEF 55-60%, moderate LVH, normal wall motion, grade 1 DD, elevated  LV filling pressure, severe LAE, mild RAE, MAC with trivial MR,  aortic valve sclerosis, trivial TR, RVSP 34 mmHg, dilated IVC that  collapses, small cirumferential pericardial effusion without  tamponade physiology    Neuro/Psych  Headaches, negative psych ROS   GI/Hepatic Neg liver ROS, GERD  Controlled and Medicated,  Endo/Other  diabetes, Type 2Hypothyroidism   Renal/GU ESRF and DialysisRenal disease (Cr 7.14, K 3.2, on PD nightly, completed dialysis last night)  negative genitourinary   Musculoskeletal  (+) Arthritis ,   Abdominal   Peds  Hematology  (+) Blood dyscrasia (Hgb 8.4), anemia ,   Anesthesia Other Findings   Reproductive/Obstetrics                           Anesthesia Physical Anesthesia Plan  ASA: 3  Anesthesia Plan: General   Post-op Pain Management:    Induction: Intravenous  PONV Risk Score and Plan: 3 and Dexamethasone, Ondansetron and Treatment may vary due to age or medical condition  Airway Management Planned: Oral ETT and Video Laryngoscope Planned  Additional Equipment:   Intra-op Plan:   Post-operative Plan: Extubation in OR  Informed Consent: I have reviewed the patients History and  Physical, chart, labs and discussed the procedure including the risks, benefits and alternatives for the proposed anesthesia with the patient or authorized representative who has indicated his/her understanding and acceptance.     Dental advisory given  Plan Discussed with: CRNA  Anesthesia Plan Comments:        Anesthesia Quick Evaluation

## 2021-08-09 NOTE — Transfer of Care (Signed)
Immediate Anesthesia Transfer of Care Note  Patient: Erika Fry Doffing  Procedure(s) Performed: LAPAROSCOPIC CHOLECYSTECTOMY (Abdomen)  Patient Location: PACU  Anesthesia Type:General  Level of Consciousness: oriented, sedated and patient cooperative  Airway & Oxygen Therapy: Patient Spontanous Breathing and Patient connected to face mask oxygen  Post-op Assessment: Report given to RN and Post -op Vital signs reviewed and stable  Post vital signs: Reviewed  Last Vitals:  Vitals Value Taken Time  BP 153/57 08/09/21 1152  Temp    Pulse 61 08/09/21 1158  Resp 17 08/09/21 1158  SpO2 100 % 08/09/21 1158  Vitals shown include unvalidated device data.  Last Pain:  Vitals:   08/09/21 0924  TempSrc:   PainSc: 0-No pain         Complications: No notable events documented.

## 2021-08-09 NOTE — Anesthesia Postprocedure Evaluation (Signed)
Anesthesia Post Note  Patient: Erika Fry  Procedure(s) Performed: LAPAROSCOPIC CHOLECYSTECTOMY (Abdomen)     Patient location during evaluation: PACU Anesthesia Type: General Level of consciousness: awake and alert Pain management: pain level controlled Vital Signs Assessment: post-procedure vital signs reviewed and stable Respiratory status: spontaneous breathing, nonlabored ventilation, respiratory function stable and patient connected to nasal cannula oxygen Cardiovascular status: blood pressure returned to baseline and stable Postop Assessment: no apparent nausea or vomiting Anesthetic complications: no   No notable events documented.  Last Vitals:  Vitals:   08/09/21 1237 08/09/21 1252  BP: (!) 152/67 (!) 154/60  Pulse: 62 61  Resp: 14 12  Temp:    SpO2: 100% 100%    Last Pain:  Vitals:   08/09/21 1252  TempSrc:   PainSc: 7                  Zekiah Caruth L Manveer Gomes

## 2021-08-09 NOTE — Progress Notes (Signed)
PT Cancellation Note  Patient Details Name: Erika Fry MRN: [id number] DOB: [birth year]   Cancelled Treatment:    Reason Eval/Treat Not Completed: Patient not medically ready.  E9/07/2021  Ginger Carne., PT Acute Rehabilitation Services [phone]  (pager) [phone]  (office)xp Lap/Surgery took longer than expected, pt presently sedated and unable to participate with a therapy evaluation.  Will see as able 9/9.    Tessie Fass Amaiyah Nordhoff 08/09/2021, 3:32 PM

## 2021-08-09 NOTE — Progress Notes (Signed)
General Surgery Follow Up Note  Subjective:    Overnight Issues:   Objective:  Vital signs for last 24 hours: Temp:  [97.6 F (36.4 C)-98.3 F (36.8 C)] 97.6 F (36.4 C) (09/08 0905) Pulse Rate:  [59-69] 59 (09/08 0905) Resp:  [11-26] 20 (09/08 0905) BP: (74-171)/(30-114) 171/114 (09/08 0905) SpO2:  [94 %-100 %] 100 % (09/08 0905) Weight:  [77.1 kg] 77.1 kg (09/08 0905)  Hemodynamic parameters for last 24 hours:    Intake/Output from previous day: 09/07 0701 - 09/08 0700 In: 22408.8 [P.O.:120; I.V.:84.8; IV Piggyback:250] Out: 22948   Intake/Output this shift: No intake/output data recorded.  Vent settings for last 24 hours:    Physical Exam:  Gen: comfortable, no distress Neuro: non-focal exam HEENT: PERRL Neck: supple CV: RRR Pulm: unlabored breathing Abd: soft, NT, PD cath R abd GU: spont voids Extr: wwp, no edema   Results for orders placed or performed during the hospital encounter of 08/06/21 (from the past 24 hour(s))  Glucose, capillary     Status: Abnormal   Collection Time: 08/08/21 11:36 AM  Result Value Ref Range   Glucose-Capillary 248 (H) 70 - 99 mg/dL  Body fluid cell count with differential     Status: Abnormal   Collection Time: 08/08/21  3:25 PM  Result Value Ref Range   Fluid Type-FCT PERITONEAL DIALYSATE    Color, Fluid COLORLESS (A) YELLOW   Appearance, Fluid CLEAR (A) CLEAR   Total Nucleated Cell Count, Fluid 3 0 - 1,000 cu mm   Other Cells, Fluid FEW LYMPHOCYTES, FEW MONOCYTES, RARE NEUTROPHILS %  Glucose, capillary     Status: Abnormal   Collection Time: 08/08/21  3:41 PM  Result Value Ref Range   Glucose-Capillary 213 (H) 70 - 99 mg/dL  Glucose, capillary     Status: Abnormal   Collection Time: 08/08/21  7:17 PM  Result Value Ref Range   Glucose-Capillary 191 (H) 70 - 99 mg/dL  Glucose, capillary     Status: Abnormal   Collection Time: 08/08/21 11:30 PM  Result Value Ref Range   Glucose-Capillary 263 (H) 70 - 99 mg/dL   Basic metabolic panel     Status: Abnormal   Collection Time: 08/09/21  3:22 AM  Result Value Ref Range   Sodium 129 (L) 135 - 145 mmol/L   Potassium 3.2 (L) 3.5 - 5.1 mmol/L   Chloride 96 (L) 98 - 111 mmol/L   CO2 17 (L) 22 - 32 mmol/L   Glucose, Bld 246 (H) 70 - 99 mg/dL   BUN 41 (H) 8 - 23 mg/dL   Creatinine, Ser 7.14 (H) 0.44 - 1.00 mg/dL   Calcium 9.5 8.9 - 10.3 mg/dL   GFR, Estimated 5 (L) >60 mL/min   Anion gap 16 (H) 5 - 15  Magnesium     Status: None   Collection Time: 08/09/21  3:22 AM  Result Value Ref Range   Magnesium 1.9 1.7 - 2.4 mg/dL  Phosphorus     Status: Abnormal   Collection Time: 08/09/21  3:22 AM  Result Value Ref Range   Phosphorus 5.3 (H) 2.5 - 4.6 mg/dL  CBC     Status: Abnormal   Collection Time: 08/09/21  3:22 AM  Result Value Ref Range   WBC 12.0 (H) 4.0 - 10.5 K/uL   RBC 2.63 (L) 3.87 - 5.11 MIL/uL   Hemoglobin 8.4 (L) 12.0 - 15.0 g/dL   HCT 24.7 (L) 36.0 - 46.0 %   MCV 93.9 80.0 -  100.0 fL   MCH 31.9 26.0 - 34.0 pg   MCHC 34.0 30.0 - 36.0 g/dL   RDW 13.2 11.5 - 15.5 %   Platelets 159 150 - 400 K/uL   nRBC 0.0 0.0 - 0.2 %  Osmolality     Status: None   Collection Time: 08/09/21  3:22 AM  Result Value Ref Range   Osmolality 286 275 - 295 mOsm/kg  Glucose, capillary     Status: Abnormal   Collection Time: 08/09/21  3:47 AM  Result Value Ref Range   Glucose-Capillary 230 (H) 70 - 99 mg/dL  Glucose, capillary     Status: Abnormal   Collection Time: 08/09/21  7:44 AM  Result Value Ref Range   Glucose-Capillary 187 (H) 70 - 99 mg/dL  Glucose, capillary     Status: Abnormal   Collection Time: 08/09/21  9:10 AM  Result Value Ref Range   Glucose-Capillary 176 (H) 70 - 99 mg/dL    Assessment & Plan: The plan of care was discussed with the bedside nurse for the day, who is in agreement with this plan and no additional concerns were raised.   Present on Admission:  Septic shock ([place])  Hypokalemia  Hypothyroidism  Pacemaker Medtronic Azure  XT DR MRI Dual Chamber Pacemaker 03/25/2018  Chronic kidney disease (CKD), stage IV (severe) (H[facility])  Diabetes mellitus type 2 in obese ([place])  Chronic heart failure with preserved ejection fraction (HCC)  Hypotension    LOS: 3 days   Additional comments:I reviewed the patient's new clinical lab test results.   and I reviewed the patients new imaging test results.    Symptomatic cholelithiasis/Cholecystitis - HIDA 9/6 with nonvisualized GB. [place] with sludge and stones. She describes pre-hospital episodes of symptomatic cholelithiasis and I think a cholecystectomy would be in her best interest, albeit high risk for complications. We discussed at length and in the presence of her daughter-in-law via phone her risks of complications from general anesthesia, baseline surgical risks of laparoscopic cholecystectomy, including risk of bleeding, infection, biloma, hematoma, injury to common bile duct, need for IOC to delineate anatomy, and need for conversion to open procedure. We discussed the increased risk of infectious complications due to steroid use, which is being titrated down/off by the primary team. Discussed with RPh the addition of vitamin A to mitigate wound healing effects of steroids in the interim. 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 We discussed the need for transition from PD to HD and will plan for this to be performed via her RUE AVF that was palpated this AM to have an excellent thrill. I also discussed with Dr. Jonnie Finner of nephrology, who is in agreement with this plan. Discussed with primary team as well. Patient was a candidate for transfer to progressive unit, but in the setting of surgery, will plan for her to return to ICU for overnight observation post-operatively and transfer to progressive in AM if she is  stable.    FEN: NPO MN for OR ID: [id number] 9/6>> VTE: SQH   Shock GERD ESRD on PD Hypothyroidism Chronic dyspnea   Jesusita Oka, MD Trauma & General Surgery Please use [email or web address] to contact on call provider  08/09/2021  *Care during the described time interval was provided by me. I have reviewed this patient's available data, including medical history, events of note, physical examination and test results as  part of my evaluation.

## 2021-08-09 NOTE — Progress Notes (Signed)
[facility] Progress Note  Subjective: seen in room, had lap chole today. BP's better. No pressors   Vitals:   08/09/21 1400 08/09/21 1500 08/09/21 1506 08/09/21 1600  BP: (!) 149/68 (!) 139/59  (!) 129/58  Pulse: 61 66  65  Resp:  16  16  Temp:   98.1 F (36.7 C)   TempSrc:   Oral   SpO2: 98% 96%  93%  Weight:      Height:        Exam: General: WDWN; NAD; on RA Lungs: CTA bilaterally. No wheeze, rales or rhonchi. Breathing is unlabored. Heart: RRR. No murmur, rubs or gallops.  Abdomen: soft, nontender, active bowel sounds, no guarding, no rebound tenderness Lower extremities: no edema but noted discoloration bilateral lower extremities Neuro: AAOx3. Moves all extremities spontaneously. Dialysis Access: RUQ PD catheter   Dialysis: [facility]CPD 7x/week, [facility]   6 exchanges, dwell 1.5hrs, fill vol 2800cc, 80kg dry wt    Assessment/Plan: Cholecysitis - +HIDA scan showed non-visualized GB. US showed sludge and stones. On IV abx, and is now SP lap chole done today by Dr Bobbye Morton. Syncopal episode/Dizziness/Hypotension- got 3.5 L bolus in ED, getting midodrine here. Off pressors. At dry wt (70kg) today. No vol excess on exam.  ESRD - on CPPD at home.  Will transition to HD while waiting for surgical wounds to heal up. Has R arm AVF. Gentle HD tomorrow.  Anemia of CKD - Hgb 9.6-monitor trends, may need ESA if indicated. Secondary Hyperparathyroidism - Ca and PO4 okay-monitor trends. Nutrition -  advance to renal diet with fluid restriction when clinically stable       Erika Fry 08/09/2021, 5:08 PM   Recent Labs  Lab 08/08/21 0505 08/09/21 0322  K 3.2* 3.2*  BUN 44* 41*  CREATININE 7.87* 7.14*  CALCIUM 9.4 9.5  PHOS 5.3* 5.3*  HGB 8.3* 8.4*    Inpatient medications:  amisulpride       vitamin C  500 mg Oral BID   Chlorhexidine Gluconate Cloth  6 each Topical Q0600   [START ON 08/10/2021] Chlorhexidine Gluconate Cloth  6 each Topical Q0600   escitalopram  5 mg  Oral Daily   fentaNYL       gentamicin cream  1 application Topical Daily   heparin  5,000 Units Subcutaneous Q8H   hydrocortisone sod succinate (SOLU-CORTEF) inj  50 mg Intravenous Daily   insulin aspart  0-6 Units Subcutaneous Q4H   levothyroxine  75 mcg Oral Q0600   midodrine  5 mg Oral TID WC   pantoprazole  40 mg Oral QHS   senna  1 tablet Oral QHS   simvastatin  40 mg Oral q1800   vitamin A  10,000 Units Oral Daily   zinc sulfate  220 mg Oral BID    sodium chloride Stopped (08/09/21 0852)   dialysis solution 1.5% low-MG/low-CA     dialysis solution 2.5% low-MG/low-CA     norepinephrine (LEVOPHED) Adult infusion Stopped (08/08/21 0435)   piperacillin-tazobactam 2.25 g (08/09/21 1454)   diclofenac Sodium, docusate sodium, fentaNYL (SUBLIMAZE) injection, dianeal solution for CAPD/CCPD with heparin, HYDROcodone-acetaminophen, ondansetron (ZOFRAN) IV, polyethylene glycol

## 2021-08-09 NOTE — Anesthesia Procedure Notes (Signed)
Procedure Name: Intubation Date/Time: 08/09/2021 10:34 AM Performed by: Jenne Campus, CRNA Pre-anesthesia Checklist: Patient identified, Emergency Drugs available, Suction available and Patient being monitored Patient Re-evaluated:Patient Re-evaluated prior to induction Oxygen Delivery Method: Circle System Utilized Preoxygenation: Pre-oxygenation with 100% oxygen Induction Type: IV induction Ventilation: Mask ventilation without difficulty Laryngoscope Size: Miller and 2 Grade View: Grade III Tube type: Oral Tube size: 7.0 mm Number of attempts: 1 Airway Equipment and Method: Stylet and Oral airway Placement Confirmation: ETT inserted through vocal cords under direct vision, positive ETCO2 and breath sounds checked- equal and bilateral Secured at: 21 cm Tube secured with: Tape Dental Injury: Teeth and Oropharynx as per pre-operative assessment  Difficulty Due To: Difficulty was anticipated, Difficult Airway- due to reduced neck mobility and Difficult Airway- due to limited oral opening

## 2021-08-09 NOTE — Progress Notes (Signed)
PROGRESS NOTE    Erika Fry  CHY:[id number] DOB: 04-23-1946 DOA: 08/06/2021 PCP: [name], MD   Brief Narrative: 75 year old with past medical history significant for ESRD on peritoneal dialysis, AV block with pacemaker, diabetes, GERD, hyperlipidemia, hypothyroidism, arthritis who presented with generalized weakness and dizziness.  Patient ran out of her midodrine for over a week secondary to shortage at the pharmacy.  She had a syncopal episode day prior to admission.  Presented to the ED because she remained dizzy, complaining of abdominal pain after eating and reduced appetite.  Patient was initially hypotensive with systolic blood pressure in the 60 improved with 3.5 Lof fluids , MAP remains less than 65 so she was a started on peripheral Levophed.  Lactic acid up to 3.0.  CT abdomen and pelvis showed gallbladder stone and layering sludge without evidence of cholecystitis with moderate to severe right side hydronephrosis without obstructing calculus.   Patient was admitted with septic shock secondary to cholecystitis.  HIDA scan positive for cholecystitis.  Plan for cholecystectomy 9/8.  Patient care transfer From CCM service to triad on 9/8.  Assessment & Plan:   Principal Problem:   Septic shock ([place]) Active Problems:   Chronic kidney disease (CKD), stage IV (severe) (HCC)   Hypothyroidism   Hypokalemia   Chronic heart failure with preserved ejection fraction (HCC)   Pacemaker Medtronic Azure XT DR MRI Dual Chamber Pacemaker 03/25/2018   Diabetes mellitus type 2 in obese ([place])   ESRD on peritoneal dialysis ([place])   Hypotension   Pain of upper abdomen  1-Septic shock, component of hypovolemic shock, intra-abdominal source of infection, cholecystitis: -Patient presented with hypotension which did not improve with IV fluids.  She was a started on Levophed. -Patient presented with leukocytosis, hypotension, lactic acidosis, source of infection cholecystitis. -She is  now transition to home dose midodrine.  White blood cell trending down. -Continue with IV Zosyn -Cholecystectomy 9/8 -Cortisol at 10.  She was a started on IV steroids.  Plan to start weaning the steroids.  2-Acute cholecystitis, cholelithiasis: -HIDA scan positive for cholecystitis.  Ultrasound positive for gallbladder sludge and possible small stone. -Patient presented with leukocytosis and postprandial abdominal pain.  Septic shock. -Plan for cholecystectomy 9/8 -Appreciate Surgery evaluation. -Plan to keep in the ICU overnight post surgery. -Norco and fentanyl PN for pain.   3-ESRD on peritoneal dialysis: Nephrology following. Will need to transition to hemodialysis for 2 weeks post surgery.  4-Hypokalemia, hyponatremia: Correction with dialysis  5-Anion gap metabolic acidosis: Gap of 16.  Secondary to his ESRD and lactic acidosis Hypothyroidism: Continue with Synthroid  6-Chronic dyspnea, DLCO/VA 83% consistent with mild obstructive disease. Continue with PRN albuterol  7-chronic leg pain: Continue with hydrocodone Hyperglycemia: Suspect secondary to stress dose steroid: Continue with a sliding scale The wound care documentation below: POA                  Pressure Injury 08/07/21 Buttocks Left Deep Tissue Pressure Injury - Purple or maroon localized area of discolored intact skin or blood-filled blister due to damage of underlying soft tissue from pressure and/or shear. (Active)  08/07/21 0100  Location: Buttocks  Location Orientation: Left  Staging: Deep Tissue Pressure Injury - Purple or maroon localized area of discolored intact skin or blood-filled blister due to damage of underlying soft tissue from pressure and/or shear.  Wound Description (Comments):   Present on Admission: Yes       Estimated body mass index is 30.6 kg/m as calculated from  the following:   Height as of this encounter: 5\' 5"  (1.651 m).   Weight as of this encounter: 83.4 kg.   DVT  prophylaxis: Heparin Code Status: Full code Family Communication: With patient Disposition Plan:  Status is: Inpatient  Remains inpatient appropriate because:IV treatments appropriate due to intensity of illness or inability to take PO  Dispo: The patient is from: Home              Anticipated d/c is to:  To be determined              Patient currently is not medically stable to d/c.   Difficult to place patient No        Consultants:  General surgery CCM admitted patient  Procedures:  Cholecystectomy  9/8  Antimicrobials:    Subjective: Patient is alert and conversant, she reported mild abdominal pain.  She reports some shortness of breath on exertion, she has been evaluated by her cardiology with normal stress test in the past.  Currently she denies chest pain and shortness of breath  Objective: Vitals:   08/09/21 0500 08/09/21 0600 08/09/21 0700 08/09/21 0746  BP: (!) 104/34 (!) 110/31 (!) 74/45   Pulse: 60 60 60   Resp: 18 15 17    Temp:    97.7 F (36.5 C)  TempSrc:    Oral  SpO2: 97% 100% 96%   Weight:      Height:        Intake/Output Summary (Last 24 hours) at 08/09/2021 0827 Last data filed at 08/09/2021 0700 Gross per 24 hour  Intake 22408.82 ml  Output 22948 ml  Net -539.18 ml   Filed Weights   08/06/21 1716 08/07/21 0050 08/07/21 0354  Weight: 74.8 kg 83.4 kg 83.4 kg    Examination:  General exam: Appears calm and comfortable  Respiratory system: Clear to auscultation. Respiratory effort normal. Cardiovascular system: S1 & S2 heard, RRR. No JVD, murmurs, rubs, gallops or clicks. No pedal edema. Gastrointestinal system: Abdomen is soft, mild tender right Upper quadrant.  Central nervous system: Alert and oriented. No focal neurological deficits. Extremities: Symmetric 5 x 5 power.   Data Reviewed: I have personally reviewed following labs and imaging studies  CBC: Recent Labs  Lab 08/06/21 1925 08/07/21 0626 08/08/21 0505  08/09/21 0322  WBC 14.1* 17.8* 13.1* 12.0*  NEUTROABS 10.1*  --   --   --   HGB 9.9* 9.6* 8.3* 8.4*  HCT 29.2* 29.0* 24.8* 24.7*  MCV 96.7 96.3 95.4 93.9  PLT 131* 160 138* 532   Basic Metabolic Panel: Recent Labs  Lab 08/06/21 1725 08/07/21 0010 08/07/21 0626 08/08/21 0505 08/09/21 0322  NA 131*  --  131* 128* 129*  K 2.7*  --  3.2* 3.2* 3.2*  CL 91*  --  99 95* 96*  CO2 21*  --  18* 17* 17*  GLUCOSE 168*  --  197* 233* 246*  BUN 38*  --  38* 44* 41*  CREATININE 8.50* 6.70* 7.57* 7.87* 7.14*  CALCIUM 9.6  --  9.1 9.4 9.5  MG  --  1.2* 1.5* 2.2 1.9  PHOS  --   --  5.6* 5.3* 5.3*   GFR: Estimated Creatinine Clearance: 7 mL/min (A) (by C-G formula based on SCr of 7.14 mg/dL (H)). Liver Function Tests: Recent Labs  Lab 08/06/21 1725  AST 19  ALT 13  ALKPHOS 126  BILITOT 0.7  PROT 6.1*  ALBUMIN 2.4*   Recent Labs  Lab 08/06/21  1725  LIPASE 28   No results for input(s): AMMONIA in the last 168 hours. Coagulation Profile: Recent Labs  Lab 08/06/21 1725  INR 1.0   Cardiac Enzymes: No results for input(s): CKTOTAL, CKMB, CKMBINDEX, TROPONINI in the last 168 hours. BNP (last 3 results) No results for input(s): PROBNP in the last 8760 hours. HbA1C: Recent Labs    08/07/21 0626  HGBA1C 6.2*   CBG: Recent Labs  Lab 08/08/21 1541 08/08/21 1917 08/08/21 2330 08/09/21 0347 08/09/21 0744  GLUCAP 213* 191* 263* 230* 187*   Lipid Profile: No results for input(s): CHOL, HDL, LDLCALC, TRIG, CHOLHDL, LDLDIRECT in the last 72 hours. Thyroid Function Tests: Recent Labs    08/07/21 0010  TSH 1.184   Anemia Panel: No results for input(s): VITAMINB12, FOLATE, FERRITIN, TIBC, IRON, RETICCTPCT in the last 72 hours. Sepsis Labs: Recent Labs  Lab 08/06/21 1725 08/06/21 1925  LATICACIDVEN 3.0* 2.1*    Recent Results (from the past 240 hour(s))  Resp Panel by RT-PCR (Flu A&B, Covid) Nasopharyngeal Swab     Status: None   Collection Time: 08/06/21  5:25 PM    Specimen: Nasopharyngeal Swab; Nasopharyngeal(NP) swabs in vial transport medium  Result Value Ref Range Status   SARS Coronavirus 2 by RT PCR NEGATIVE NEGATIVE Final    Comment: (NOTE) SARS-CoV-2 target nucleic acids are NOT DETECTED.  The SARS-CoV-2 RNA is generally detectable in upper respiratory specimens during the acute phase of infection. The lowest concentration of SARS-CoV-2 viral copies this assay can detect is 138 copies/mL. A negative result does not preclude SARS-Cov-2 infection and should not be used as the sole basis for treatment or other patient management decisions. A negative result may occur with  improper specimen collection/handling, submission of specimen other than nasopharyngeal swab, presence of viral mutation(s) within the areas targeted by this assay, and inadequate number of viral copies(<138 copies/mL). A negative result must be combined with clinical observations, patient history, and epidemiological information. The expected result is Negative.  Fact Sheet for Patients:  EntrepreneurPulse.com.au  Fact Sheet for Healthcare Providers:  IncredibleEmployment.be  This test is no t yet approved or cleared by the [place] FDA and  has been authorized for detection and/or diagnosis of SARS-CoV-2 by FDA under an Emergency Use Authorization (EUA). This EUA will remain  in effect (meaning this test can be used) for the duration of the COVID-19 declaration under Section 564(b)(1) of the Act, 21 U.S.C.section 360bbb-3(b)(1), unless the authorization is terminated  or revoked sooner.       Influenza A by PCR NEGATIVE NEGATIVE Final   Influenza B by PCR NEGATIVE NEGATIVE Final    Comment: (NOTE) The Xpert Xpress SARS-CoV-2/FLU/RSV plus assay is intended as an aid in the diagnosis of influenza from Nasopharyngeal swab specimens and should not be used as a sole basis for treatment. Nasal washings and aspirates are  unacceptable for Xpert Xpress SARS-CoV-2/FLU/RSV testing.  Fact Sheet for Patients: EntrepreneurPulse.com.au  Fact Sheet for Healthcare Providers: IncredibleEmployment.be  This test is not yet approved or cleared by the [place] FDA and has been authorized for detection and/or diagnosis of SARS-CoV-2 by FDA under an Emergency Use Authorization (EUA). This EUA will remain in effect (meaning this test can be used) for the duration of the COVID-19 declaration under Section 564(b)(1) of the Act, 21 U.S.C. section 360bbb-3(b)(1), unless the authorization is terminated or revoked.  Performed at [facility], [place] [address], [place], [place] [address]   Blood Culture (routine x  2)     Status: None (Preliminary result)   Collection Time: 08/06/21  5:25 PM   Specimen: BLOOD RIGHT FOREARM  Result Value Ref Range Status   Specimen Description BLOOD RIGHT FOREARM  Final   Special Requests   Final    BOTTLES DRAWN AEROBIC AND ANAEROBIC Blood Culture results may not be optimal due to an inadequate volume of blood received in culture bottles   Culture   Final    NO GROWTH 3 DAYS Performed at [facility], [place] [address] [place], [place] [address]    Report Status PENDING  Incomplete  Blood Culture (routine x 2)     Status: None (Preliminary result)   Collection Time: 08/06/21  5:30 PM   Specimen: BLOOD RIGHT FOREARM  Result Value Ref Range Status   Specimen Description BLOOD RIGHT FOREARM  Final   Special Requests   Final    BOTTLES DRAWN AEROBIC AND ANAEROBIC Blood Culture results may not be optimal due to an inadequate volume of blood received in culture bottles   Culture   Final    NO GROWTH 3 DAYS Performed at [facility], [place] [address] [place], [place] [address]    Report Status PENDING  Incomplete  MRSA Next Gen by PCR, Nasal     Status: None   Collection Time: 08/07/21 12:54 AM   Specimen: Nasal Mucosa; Nasal Swab   Result Value Ref Range Status   MRSA by PCR Next Gen NOT DETECTED NOT DETECTED Final    Comment: (NOTE) The GeneXpert MRSA Assay (FDA approved for NASAL specimens only), is one component of a comprehensive MRSA colonization surveillance program. It is not intended to diagnose MRSA infection nor to guide or monitor treatment for MRSA infections. Test performance is not FDA approved in patients less than 1 years old. Performed at [facility], [place] [address] [place], [place] [address]          Radiology Studies: DG Knee 1-2 Views Left  Result Date: 08/08/2021 CLINICAL DATA:  Fall on left knee at home. Pain and swelling. Can not bear weight. EXAM: LEFT KNEE - 1-2 VIEW COMPARISON:  None. FINDINGS: No fracture. Medial tibiofemoral joint space narrowing with near complete joint space loss. Moderate tricompartmental peripheral spurring. There is a small knee joint effusion without lipohemarthrosis. Punctate density projects in the medial soft tissues overlying the distal femur. Vascular calcifications are seen. Mild soft tissue edema. IMPRESSION: 1. No acute fracture or dislocation of the left knee. 2. Moderate osteoarthritis with near complete joint space loss in the medial tibiofemoral compartment. Small knee joint effusion. Electronically Signed   By: Keith Rake M.D.   On: 08/08/2021 13:15   NM Hepatobiliary Liver Func  Result Date: 08/07/2021 CLINICAL DATA:  Sepsis abdominal pain EXAM: NUCLEAR MEDICINE HEPATOBILIARY IMAGING TECHNIQUE: Sequential images of the abdomen were obtained out to 60 minutes following intravenous administration of radiopharmaceutical. Delayed 4 hour images obtained. RADIOPHARMACEUTICALS:  5.5 mCi Tc-41m  Choletec IV COMPARISON:  CT 08/06/2021, ultrasound 08/18/2020 FINDINGS: Prompt uptake and biliary excretion of activity by the liver is seen. No gallbladder activity is visualized during the first hour of imaging, no gallbladder activity visualized on 4 hour  delayed image. Normal biliary to bowel transit. IMPRESSION: Nonvisualized gallbladder after 4 hours of imaging, raising concern for cystic duct obstruction/acute cholecystitis. Note that false positives may occur in the setting of prolonged fasting, TPN, and severe liver disease. No recent ultrasound imaging which might be helpful  for further evaluation. These results will be called to the ordering clinician or representative by the Radiologist Assistant, and communication documented in the PACS or [employer]. Electronically Signed   By: Donavan Foil M.D.   On: 08/07/2021 21:05   US Abdomen Limited RUQ (LIVER/GB)  Result Date: 08/07/2021 CLINICAL DATA:  Gallbladder disease. EXAM: ULTRASOUND ABDOMEN LIMITED RIGHT UPPER QUADRANT COMPARISON:  Nuclear medicine hepatobiliary scan earlier today. CT 08/06/2021. Abdominal ultrasound 08/18/2020 FINDINGS: Gallbladder: Physiologically distended. Layering sludge and possibly small stones. No gallbladder wall thickening. No sonographic Murphy sign noted by sonographer. Common bile duct: Diameter: 4-5 mm. Liver: No intrahepatic biliary ductal dilatation. No focal lesion identified. Within normal limits in parenchymal echogenicity. Portal vein is patent on color Doppler imaging with normal direction of blood flow towards the liver. Other: Right hydronephrosis as seen on CT yesterday. Small amount of right upper quadrant free fluid is seen on CT yesterday. IMPRESSION: 1. Layering gallbladder sludge and possibly small stones. 2. No sonographic findings of acute cholecystitis. No biliary dilatation or abnormal gallbladder distension. Electronically Signed   By: Keith Rake M.D.   On: 08/07/2021 22:45        Scheduled Meds:  Chlorhexidine Gluconate Cloth  6 each Topical Q0600   escitalopram  5 mg Oral Daily   gentamicin cream  1 application Topical Daily   heparin  5,000 Units Subcutaneous Q8H   hydrocortisone sod succinate (SOLU-CORTEF) inj  100 mg  Intravenous Q12H   insulin aspart  0-6 Units Subcutaneous Q4H   levothyroxine  75 mcg Oral Q0600   midodrine  5 mg Oral TID WC   pantoprazole  40 mg Oral QHS   senna  1 tablet Oral QHS   simvastatin  40 mg Oral q1800   Continuous Infusions:  sodium chloride 10 mL/hr at 08/09/21 0700   dialysis solution 1.5% low-MG/low-CA     dialysis solution 2.5% low-MG/low-CA     norepinephrine (LEVOPHED) Adult infusion Stopped (08/08/21 0435)   piperacillin-tazobactam Stopped (08/09/21 0525)     LOS: 3 days    Time spent: 35 minutes.     Elmarie Shiley, MD [facility]   If 7PM-7AM, please contact night-coverage [email or web address]  08/09/2021, 8:27 AM

## 2021-08-10 ENCOUNTER — Encounter (HOSPITAL_COMMUNITY): Payer: Self-pay | Admitting: Surgery

## 2021-08-10 LAB — BASIC METABOLIC PANEL
Anion gap: 17 — ABNORMAL HIGH (ref 5–15)
BUN: 45 mg/dL — ABNORMAL HIGH (ref 8–23)
CO2: 17 mmol/L — ABNORMAL LOW (ref 22–32)
Calcium: 9.2 mg/dL (ref 8.9–10.3)
Chloride: 96 mmol/L — ABNORMAL LOW (ref 98–111)
Creatinine, Ser: 8.14 mg/dL — ABNORMAL HIGH (ref 0.44–1.00)
GFR, Estimated: 5 mL/min — ABNORMAL LOW (ref >60)
Glucose, Bld: 105 mg/dL — ABNORMAL HIGH (ref 70–99)
Potassium: 3.6 mmol/L (ref 3.5–5.1)
Sodium: 130 mmol/L — ABNORMAL LOW (ref 135–145)

## 2021-08-10 LAB — CBC
HCT: 24.4 % — ABNORMAL LOW (ref 36.0–46.0)
Hemoglobin: 8.4 g/dL — ABNORMAL LOW (ref 12.0–15.0)
MCH: 32.7 pg (ref 26.0–34.0)
MCHC: 34.4 g/dL (ref 30.0–36.0)
MCV: 94.9 fL (ref 80.0–100.0)
Platelets: 144 10*3/uL — ABNORMAL LOW (ref 150–400)
RBC: 2.57 MIL/uL — ABNORMAL LOW (ref 3.87–5.11)
RDW: 13.2 % (ref 11.5–15.5)
WBC: 14.8 10*3/uL — ABNORMAL HIGH (ref 4.0–10.5)
nRBC: 0 % (ref 0.0–0.2)

## 2021-08-10 LAB — GLUCOSE, CAPILLARY
Glucose-Capillary: 118 mg/dL — ABNORMAL HIGH (ref 70–99)
Glucose-Capillary: 104 mg/dL — ABNORMAL HIGH (ref 70–99)
Glucose-Capillary: 109 mg/dL — ABNORMAL HIGH (ref 70–99)
Glucose-Capillary: 86 mg/dL (ref 70–99)

## 2021-08-10 LAB — SURGICAL PATHOLOGY

## 2021-08-10 MED ORDER — ACETAMINOPHEN 325 MG PO TABS: 650.0000 mg | ORAL_TABLET | Freq: Once | ORAL | Status: AC

## 2021-08-10 MED ORDER — PROSOURCE PLUS PO LIQD
30.0000 mL | Freq: Two times a day (BID) | ORAL | Status: DC
  Administered 2021-08-11 – 2021-08-12 (×2): 30 mL via ORAL
  Filled 2021-08-10 (×3): qty 30

## 2021-08-10 MED ORDER — BOOST / RESOURCE BREEZE PO LIQD CUSTOM
1.0000 | Freq: Three times a day (TID) | ORAL | Status: DC
  Administered 2021-08-12 (×2): 1 via ORAL

## 2021-08-10 MED ORDER — FERRIC CITRATE 1 GM 210 MG(FE) PO TABS
420.0000 mg | ORAL_TABLET | Freq: Three times a day (TID) | ORAL | Status: DC
  Administered 2021-08-10 – 2021-08-17 (×14): 420 mg via ORAL
  Filled 2021-08-10 (×19): qty 2

## 2021-08-10 NOTE — TOC Initial Note (Signed)
Transition of Care [facility]) - Initial/Assessment Note    Patient Details  Name: Erika Fry MRN: [id number] Date of Birth: [birth year]  [facility] CM/SW Contact:    Tom-Johnson, Renea Ee, RN Phone Number: 08/10/2021, 4:55 PM  Clinical Narrative:                 CM spoke with patient at bedside. States she lives alone at home. Has three sons and their wives who visits and transports to and from her appointments and other errands. Has wheelchair, walker, hospital bed, shower chair, cane at home. States she has used a home health care agency before but cannot remember the name. CM told her she will be given a list if recommended. Denies any needs at this time. TOC will continue to follow with needs.  Expected Discharge Plan: Home/Self Care Barriers to Discharge: Continued Medical Work up   Patient Goals and CMS Choice Patient states their goals for this hospitalization and ongoing recovery are:: To go home CMS Medicare.gov Compare Post Acute Care list provided to:: Patient    Expected Discharge Plan and Services Expected Discharge Plan: Home/Self Care   Discharge Planning Services: CM Consult   Living arrangements for the past 2 months: Single Family Home                                      Prior Living Arrangements/Services Living arrangements for the past 2 months: Single Family Home Lives with:: Self Patient language and need for interpreter reviewed:: Yes Do you feel safe going back to the place where you live?: Yes      Need for Family Participation in Patient Care: Yes (Comment) Care giver support system in place?: Yes (comment) Current home services: DME (Wheelchair, walker, hospital bed, shower chair, cane.)    Activities of Daily Living      Permission Sought/Granted Permission sought to share information with : Case Manager Permission granted to share information with : Yes, Verbal Permission Granted              Emotional  Assessment Appearance:: Appears stated age Attitude/Demeanor/Rapport: Engaged Affect (typically observed): Accepting, Appropriate, Hopeful, Calm Orientation: : Oriented to Self, Oriented to Place, Oriented to  Time, Oriented to Situation Alcohol / Substance Use: Not Applicable Psych Involvement: No (comment)  Admission diagnosis:  Lactic acidosis [E87.2] Epigastric pain [R10.13] Calculus of gallbladder without cholecystitis without obstruction [K80.20] Hypotension [I95.9] Hypotension, unspecified hypotension type [I95.9] Leukocytosis, unspecified type [D72.829] Patient Active Problem List   Diagnosis Date Noted   Pain of upper abdomen    Septic shock ([place]) 08/06/2021   Hypotension 08/06/2021   Changes in vision 06/21/2021   Temporal arteritis syndrome ([place]) 06/21/2021   Acidosis 10/06/2020   Disorder of lipoprotein metabolism, unspecified 10/06/2020   Liver disease, unspecified 10/06/2020   Moderate protein-calorie malnutrition ([place]) 10/06/2020   Other abnormal findings in urine 10/06/2020   Other dietary vitamin B12 deficiency anemia 10/06/2020   Other disorders of bilirubin metabolism 10/06/2020   Other disorders of electrolyte and fluid balance, not elsewhere classified 10/06/2020   Other disorders resulting from impaired renal tubular function 10/06/2020   Other long term (current) drug therapy 10/06/2020   Allergy, unspecified, initial encounter 08/21/2020   Anaphylactic shock, unspecified, initial encounter 08/21/2020   Nausea 05/18/2020   Acute respiratory disease due to COVID-19 virus 12/25/2019   Encounter for care of pacemaker 09/07/2019  Mild protein-calorie malnutrition ([place]) 07/01/2019   Coagulation defect, unspecified ([place]) 06/29/2019   Secondary hyperparathyroidism of renal origin ([place]) 06/29/2019   ESRD on peritoneal dialysis ([place])    Pain in both lower extremities    Hypomagnesemia 06/11/2019   DNR (do not resuscitate) discussion    Cystitis    Acute on  chronic diastolic CHF (congestive heart failure) ([place]) 06/10/2019   Chronic diastolic CHF (congestive heart failure) ([place]) 06/09/2019   Cellulitis of lower extremity 06/09/2019   Bladder pain    Hyponatremia    Acute on chronic anemia    Diabetes mellitus type 2 in obese [facility])    Chronic kidney disease    Drug induced constipation    Neurogenic bladder    Weakness generalized 04/21/2019   Acquired hydronephrosis with ureteropelvic junction (UPJ) obstruction    Pain in joint involving pelvic region and thigh    Dysuria    Palliative care by specialist    AKI (acute kidney injury) ([place]) 04/10/2019   Hydronephrosis 04/09/2019   Interstitial cystitis 04/09/2019   Bilateral leg edema 02/25/2019   Chronic heart failure with preserved ejection fraction ([place]) 02/24/2019   Pacemaker Medtronic Azure XT DR MRI Dual Chamber Pacemaker 03/25/2018 02/24/2019   ARF (acute renal failure) ([place]) 02/10/2019   Peripheral edema    Fever 01/17/2019   Acute lower UTI 01/17/2019   Pressure injury of skin 01/17/2019   Gross hematuria 01/14/2019   Fluid retention    Generalized edema    Hypokalemia    Acute right-sided low back pain with right-sided sciatica    Acute blood loss anemia    Anemia of chronic disease    Thrombocytopenia (HCC)    Diabetes mellitus ([place])    Morbid obesity ([place])    Hypoalbuminemia due to protein-calorie malnutrition ([place])    Occult blood in stools    Gastric polyp    Lumbar discitis 05/17/2018   Blood loss anemia 05/12/2018   Hyperlipidemia associated with type 2 diabetes mellitus ([place]) 05/10/2018   Yeast UTI 04/30/2018   Chronic kidney disease (CKD), stage IV (severe) (HCC)    Diet-controlled diabetes mellitus ([place])    GERD (gastroesophageal reflux disease)    Gout    High cholesterol    Hypothyroidism    Iron deficiency anemia    AV block, Mobitz 2 03/25/2018   Mobitz type 2 second degree AV block 03/20/2018   Exertional dyspnea 03/19/2018   Acute pain of left  knee 07/16/2016   Right wrist pain 07/16/2016   PCP:  [name], MD Pharmacy:   [facility] [place] [address] Phone: [phone] Fax: [phone]  [facility] [address] [place] [address] Phone: [phone] Fax: [phone]     Social Determinants of Health (SDOH) Interventions    Readmission Risk Interventions Readmission Risk Prevention Plan 12/29/2019 06/15/2019 04/13/2019  Transportation Screening Complete Complete Complete  Medication Review Press photographer) Complete Complete Complete  PCP or Specialist appointment within 3-5 days of discharge (No Data) Not Complete Complete  PCP/Specialist Appt Not Complete comments - not ready for dc -  HRI or Home Care Consult Complete Complete Complete  SW Recovery Care/Counseling Consult - Complete Complete  Palliative Care Screening Not Applicable Not Applicable Not [place] Not Applicable Not Applicable Patient Refused  Some recent data might be hidden

## 2021-08-10 NOTE — Progress Notes (Signed)
PT Cancellation Note  Patient Details Name: Erika Fry MRN: [id number] DOB: 1946/06/13   Cancelled Treatment:    Reason Eval/Treat Not Completed: Patient at procedure or test/unavailable.  Pt in HD when arrived for the evaluation.  Will see pt 9/10 as able. 08/10/2021  Ginger Carne., PT Acute Rehabilitation Services [phone]  (pager) [phone]  (office)   Tessie Fass Mariem Skolnick 08/10/2021, 6:44 PM

## 2021-08-10 NOTE — Progress Notes (Deleted)
Office Visit Note  Patient: Erika Fry             Date of Birth: 27-Nov-1946           MRN: [id number]             PCP: [name], MD Referring: Melissa Noon, OD Visit Date: 08/21/2021 Occupation: @GUAROCC @  Subjective:  No chief complaint on file.   History of Present Illness: Erika Fry is a 75 y.o. female ***   Activities of Daily Living:  Patient reports morning stiffness for *** {minute/hour:19697}.   Patient {ACTIONS;DENIES/REPORTS:21021675::"Denies"} nocturnal pain.  Difficulty dressing/grooming: {ACTIONS;DENIES/REPORTS:21021675::"Denies"} Difficulty climbing stairs: {ACTIONS;DENIES/REPORTS:21021675::"Denies"} Difficulty getting out of chair: {ACTIONS;DENIES/REPORTS:21021675::"Denies"} Difficulty using hands for taps, buttons, cutlery, and/or writing: {ACTIONS;DENIES/REPORTS:21021675::"Denies"}  No Rheumatology ROS completed.   PMFS History:  Patient Active Problem List   Diagnosis Date Noted   Pain of upper abdomen    Septic shock ([place]) 08/06/2021   Hypotension 08/06/2021   Changes in vision 06/21/2021   Temporal arteritis syndrome ([place]) 06/21/2021   Acidosis 10/06/2020   Disorder of lipoprotein metabolism, unspecified 10/06/2020   Liver disease, unspecified 10/06/2020   Moderate protein-calorie malnutrition ([place]) 10/06/2020   Other abnormal findings in urine 10/06/2020   Other dietary vitamin B12 deficiency anemia 10/06/2020   Other disorders of bilirubin metabolism 10/06/2020   Other disorders of electrolyte and fluid balance, not elsewhere classified 10/06/2020   Other disorders resulting from impaired renal tubular function 10/06/2020   Other long term (current) drug therapy 10/06/2020   Allergy, unspecified, initial encounter 08/21/2020   Anaphylactic shock, unspecified, initial encounter 08/21/2020   Nausea 05/18/2020   Acute respiratory disease due to COVID-19 virus 12/25/2019   Encounter for care of pacemaker 09/07/2019   Mild  protein-calorie malnutrition ([place]) 07/01/2019   Coagulation defect, unspecified ([place]) 06/29/2019   Secondary hyperparathyroidism of renal origin ([place]) 06/29/2019   ESRD on peritoneal dialysis ([place])    Pain in both lower extremities    Hypomagnesemia 06/11/2019   DNR (do not resuscitate) discussion    Cystitis    Acute on chronic diastolic CHF (congestive heart failure) ([place]) 06/10/2019   Chronic diastolic CHF (congestive heart failure) ([place]) 06/09/2019   Cellulitis of lower extremity 06/09/2019   Bladder pain    Hyponatremia    Acute on chronic anemia    Diabetes mellitus type 2 in obese (HCC)    Chronic kidney disease    Drug induced constipation    Neurogenic bladder    Weakness generalized 04/21/2019   Acquired hydronephrosis with ureteropelvic junction (UPJ) obstruction    Pain in joint involving pelvic region and thigh    Dysuria    Palliative care by specialist    AKI (acute kidney injury) ([place]) 04/10/2019   Hydronephrosis 04/09/2019   Interstitial cystitis 04/09/2019   Bilateral leg edema 02/25/2019   Chronic heart failure with preserved ejection fraction ([place]) 02/24/2019   Pacemaker Medtronic Azure XT DR MRI Dual Chamber Pacemaker 03/25/2018 02/24/2019   ARF (acute renal failure) ([place]) 02/10/2019   Peripheral edema    Fever 01/17/2019   Acute lower UTI 01/17/2019   Pressure injury of skin 01/17/2019   Gross hematuria 01/14/2019   Fluid retention    Generalized edema    Hypokalemia    Acute right-sided low back pain with right-sided sciatica    Acute blood loss anemia    Anemia of chronic disease    Thrombocytopenia (HCC)    Diabetes mellitus ([place])    Morbid obesity ([place])  Hypoalbuminemia due to protein-calorie malnutrition (HCC)    Occult blood in stools    Gastric polyp    Lumbar discitis 05/17/2018   Blood loss anemia 05/12/2018   Hyperlipidemia associated with type 2 diabetes mellitus ([place]) 05/10/2018   Yeast UTI 04/30/2018   Chronic kidney disease  (CKD), stage IV (severe) (HCC)    Diet-controlled diabetes mellitus (HCC)    GERD (gastroesophageal reflux disease)    Gout    High cholesterol    Hypothyroidism    Iron deficiency anemia    AV block, Mobitz 2 03/25/2018   Mobitz type 2 second degree AV block 03/20/2018   Exertional dyspnea 03/19/2018   Acute pain of left knee 07/16/2016   Right wrist pain 07/16/2016    Past Medical History:  Diagnosis Date   Arthritis    "knees, thumbs" (03/25/2018)   AV block, Mobitz 2 03/25/2018   CKD (chronic kidney disease), stage IV (HCC)    Complication of anesthesia    Reports had hard time waking up in the past   Diet-controlled diabetes mellitus ([place])    Encounter for care of pacemaker 09/07/2019   End stage renal failure on dialysis ([place])    T, Th, Sat [place]   GERD (gastroesophageal reflux disease)    Gout    "on daily RX" (03/25/2018)   Heart murmur    High cholesterol    History of COVID-19 12/2019   Hypothyroidism    Iron deficiency anemia    "had to get an iron infusion"   Migraine    "used to have them growing up" (03/25/2018)   Pneumonia 12/2019   with COVID   Presence of permanent cardiac pacemaker 03/25/2018   medtronic dual pacemaker: Dr Crissie Sickles    Family History  Problem Relation Age of Onset   Hypertension Mother    Diabetes Mellitus II Father    Heart disease Father    Heart attack Father    Gastric cancer Brother    Diabetes Mellitus II Brother    Stomach cancer Brother    Past Surgical History:  Procedure Laterality Date   APPENDECTOMY     ARTERY BIOPSY Right 06/25/2021   Procedure: TEMPORAL ARTERY BIOPSY RIGHT;  Surgeon: Armandina Gemma, MD;  Location: [facility];  Service: General;  Laterality: Right;  LMA   AV FISTULA PLACEMENT Right 06/29/2019   Procedure: ARTERIOVENOUS (AV) FISTULA CREATION RIGHT UPPER  ARM;  Surgeon: Waynetta Sandy, MD;  Location: [place];  Service: Vascular;  Laterality: Right;   [place] Right  09/08/2019   Procedure: BASILIC VEIN TRANSPOSITION RIGHT SECOND STAGE;  Surgeon: Waynetta Sandy, MD;  Location: [facility] OR;  Service: Vascular;  Laterality: Right;   BIOPSY  05/19/2018   Procedure: BIOPSY;  Surgeon: Ladene Artist, MD;  [facility] END[facility]COPY;  Service: Endoscopy;;   CHOLECYSTECTOMY N/A 08/09/2021   Procedure: LAPAROSCOPIC CHOLECYSTECTOMY;  Surgeon: Jesusita Oka, MD;  Location: [place];  Service: General;  Laterality: N/A;   CYSTOSCOPY W/ RETROGRADES Bilateral 01/14/2019   Procedure: CYSTOSCOPY WITH RETROGRADE PYELOGRAM BILATERAL HYDRODISTENTION;  Surgeon: Ardis Hughs, MD;  Location: [facility];  Service: Urology;  Laterality: Bilateral;   ESOPHAGOGASTRODUODENOSCOPY N/A 05/19/2018   Procedure: ESOPHAGOGASTRODUODENOSCOPY (EGD);  Surgeon: Ladene Artist, MD;  Location: [facility] ENDOSCOPY;  Service: Endoscopy;  Laterality: N/A;   ESOPHAGOGASTRODUODENOSCOPY (EGD) WITH PROPOFOL N/A 01/28/2019   Procedure: ESOPHAGOGASTRODUODENOSCOPY (EGD) WITH PROPOFOL;  Surgeon: Rush Landmark Telford Nab., MD;  Location: [place];  Service: Gastroenterology;  Laterality:  N/A;   INSERT / REPLACE / REMOVE PACEMAKER  03/25/2018   IR FLUORO GUIDE CV LINE RIGHT  05/18/2018   IR FLUORO GUIDE CV LINE RIGHT  06/24/2019   IR LUMBAR DISC ASPIRATION W/IMG GUIDE  05/15/2018   IR NEPHROSTOMY EXCHANGE RIGHT  05/28/2019   IR NEPHROSTOMY EXCHANGE RIGHT  07/19/2019   IR NEPHROSTOMY PLACEMENT RIGHT  04/09/2019   IR REMOVAL TUN CV CATH W/O FL  07/23/2018   IR US GUIDE VASC ACCESS RIGHT  05/18/2018   IR US GUIDE VASC ACCESS RIGHT  06/24/2019   KNEE ARTHROSCOPY Bilateral    PACEMAKER IMPLANT N/A 03/25/2018   Procedure: PACEMAKER IMPLANT;  Surgeon: Evans Lance, MD;  Location: [place] CV LAB;  Service: Cardiovascular;  Laterality: N/A;   PACEMAKER PLACEMENT Left 03/2018   POLYPECTOMY  01/28/2019   Procedure: POLYPECTOMY;  Surgeon: Mansouraty, Telford Nab., MD;  Location: [place];  Service: Gastroenterology;;    RIGHT HEART CATH N/A 03/20/2018   Procedure: RIGHT HEART CATH;  Surgeon: Nigel Mormon, MD;  Location: [place] CV LAB;  Service: Cardiovascular;  Laterality: N/A;   TEE WITHOUT CARDIOVERSION N/A 03/20/2018   Procedure: TRANSESOPHAGEAL ECHOCARDIOGRAM (TEE);  Surgeon: Nigel Mormon, MD;  Location: [facility] ENDOSCOPY;  Service: Cardiovascular;  Laterality: N/A;   TONSILLECTOMY     URETEROSCOPY WITH HOLMIUM LASER LITHOTRIPSY Right 01/14/2019   Procedure: URETEROSCOPY WITH HOLMIUM LASER LITHOTRIPSY BLADDER BIOPSIES RIGHT STENT PLACEMENT;  Surgeon: Ardis Hughs, MD;  Location: [facility];  Service: Urology;  Laterality: Right;   Social History   Social History Narrative   Right Handed   Lives in a one story home with basement   Drinks no caffeine    Immunization History  Administered Date(s) Administered   Hepatitis B, adult 07/22/2019, 08/19/2019, 09/23/2019, 01/20/2020   Hepatitis B, ped/adol 07/22/2019, 08/19/2019, 09/23/2019, 01/20/2020   Hepb-cpg 10/10/2020   Influenza Split 08/17/2018   Influenza, High Dose Seasonal PF 08/26/2019   Influenza,inj,Quad PF,6+ Mos 08/17/2018   Influenza-Unspecified 08/26/2019, 08/31/2020   PFIZER(Purple Top)SARS-COV-2 Vaccination 08/20/2020, 09/03/2020   Pneumococcal Conjugate-13 11/02/2019   Pneumococcal Polysaccharide-23 05/01/2018     Objective: Vital Signs: There were no vitals taken for this visit.   Physical Exam   Musculoskeletal Exam: ***  CDAI Exam: CDAI Score: -- Patient Global: --; Provider Global: -- Swollen: --; Tender: -- Joint Exam 08/21/2021   No joint exam has been documented for this visit   There is currently no information documented on the homunculus. Go to the Rheumatology activity and complete the homunculus joint exam.  Investigation: No additional findings.  Imaging: CT ABDOMEN PELVIS WO CONTRAST  Result Date: 08/06/2021 CLINICAL DATA:  Acute abdominal pain. History of peritoneal dialysis. EXAM: CT  ABDOMEN AND PELVIS WITHOUT CONTRAST TECHNIQUE: Multidetector CT imaging of the abdomen and pelvis was performed following the standard protocol without IV contrast. COMPARISON:  CT abdomen and pelvis 05/10/2019. FINDINGS: Lower chest: There is a stable 4 mm nodular density in the left lower lobe image 5/5. There is linear scarring or atelectasis in both lung bases. Cannot exclude nodular density in the left lower lung measuring 8 mm image 5/1. Leads are partially visualized in the heart. Hepatobiliary: There is gallbladder sludge versus small layering stones within the gallbladder. There is no biliary ductal dilatation identified. The liver appears within normal limits without focal lesion allowing for lack of intravenous contrast. Pancreas: Unremarkable. No pancreatic ductal dilatation or surrounding inflammatory changes. Spleen: Normal in size without focal abnormality. Adrenals/Urinary Tract:  The bilateral adrenal glands appear within normal limits. There is mild bilateral renal atrophy which has progressed compared to the prior study. There are nonobstructing left renal calculi measuring up to 4 mm. There is a subcentimeter fat containing lesion in the left kidney, unchanged, favored is angiomyolipoma. There is no left-sided hydronephrosis. The left ureter is within normal limits. There is moderate/severe right-sided hydronephrosis to the level of the ureteropelvic junction. There are numerous calculi within the right renal pelvis measuring up to 4 mm. There are additional punctate right renal calculi. No obstructing calculi are identified at the right UPJ or right ureter. The bladder is completely decompressed and not well evaluated. Stomach/Bowel: Stomach is within normal limits. Appendix is not seen. No evidence of bowel wall thickening, distention, or inflammatory changes. Vascular/Lymphatic: Aortic atherosclerosis. No enlarged abdominal or pelvic lymph nodes. Reproductive: Uterus and bilateral adnexa are  unremarkable. Other: There is a small amount of free fluid in free air throughout the abdomen and pelvis, presumably from peritoneal dialysis. Peritoneal dialysis catheter is seen entering the anterior abdominal wall with distal tip of the catheter in the left pelvis. The visualized portion of the catheter appears intact. There is no focal abdominal wall hernia. Musculoskeletal: Degenerative changes affect the spine. No acute fractures are seen. IMPRESSION: 1. Small amount of free fluid and free air throughout the abdomen and pelvis, presumably secondary to peritoneal dialysis. 2. Moderate severe right-sided hydronephrosis to the level of the ureteropelvic junction. No obstructing calculus identified. Obstruction may be related to stricture or mass at the UPJ. 3. Right renal pelvic calculi.  Bilateral renal calculi. 4. Cholelithiasis and/or gallbladder sludge. 5. Questionable 8 mm nodular density in the left lower lung, incompletely imaged. Follow-up nonemergent chest CT recommended. Electronically Signed   By: Ronney Asters M.D.   On: 08/06/2021 18:35   DG Knee 1-2 Views Left  Result Date: 08/08/2021 CLINICAL DATA:  Fall on left knee at home. Pain and swelling. Can not bear weight. EXAM: LEFT KNEE - 1-2 VIEW COMPARISON:  None. FINDINGS: No fracture. Medial tibiofemoral joint space narrowing with near complete joint space loss. Moderate tricompartmental peripheral spurring. There is a small knee joint effusion without lipohemarthrosis. Punctate density projects in the medial soft tissues overlying the distal femur. Vascular calcifications are seen. Mild soft tissue edema. IMPRESSION: 1. No acute fracture or dislocation of the left knee. 2. Moderate osteoarthritis with near complete joint space loss in the medial tibiofemoral compartment. Small knee joint effusion. Electronically Signed   By: Keith Rake M.D.   On: 08/08/2021 13:15   NM Hepatobiliary Liver Func  Result Date: 08/07/2021 CLINICAL DATA:   Sepsis abdominal pain EXAM: NUCLEAR MEDICINE HEPATOBILIARY IMAGING TECHNIQUE: Sequential images of the abdomen were obtained out to 60 minutes following intravenous administration of radiopharmaceutical. Delayed 4 hour images obtained. RADIOPHARMACEUTICALS:  5.5 mCi Tc-43m  Choletec IV COMPARISON:  CT 08/06/2021, ultrasound 08/18/2020 FINDINGS: Prompt uptake and biliary excretion of activity by the liver is seen. No gallbladder activity is visualized during the first hour of imaging, no gallbladder activity visualized on 4 hour delayed image. Normal biliary to bowel transit. IMPRESSION: Nonvisualized gallbladder after 4 hours of imaging, raising concern for cystic duct obstruction/acute cholecystitis. Note that false positives may occur in the setting of prolonged fasting, TPN, and severe liver disease. No recent ultrasound imaging which might be helpful for further evaluation. These results will be called to the ordering clinician or representative by the Radiologist Assistant, and communication documented in the PACS or [place]  Dashboard. Electronically Signed   By: Donavan Foil M.D.   On: 08/07/2021 21:05   DG Chest Port 1 View  Result Date: 08/06/2021 CLINICAL DATA:  Sepsis protocol EXAM: PORTABLE CHEST 1 VIEW COMPARISON:  Portable exam 1818 hours compared to 05/29/2021 FINDINGS: LEFT subclavian sequential transvenous pacemaker leads project at RIGHT atrium and RIGHT ventricle. Enlargement of cardiac silhouette. Mediastinal contours and pulmonary vascularity normal. Atherosclerotic calcification aorta. Bronchitic changes without infiltrate, pleural effusion, or pneumothorax. Bones demineralized. IMPRESSION: Enlargement of cardiac silhouette post pacemaker. Bronchitic changes without acute infiltrate. Aortic Atherosclerosis (ICD10-I70.0). Electronically Signed   By: Lavonia Dana M.D.   On: 08/06/2021 18:32   US Abdomen Limited RUQ (LIVER/GB)  Result Date: 08/07/2021 CLINICAL DATA:  Gallbladder disease. EXAM:  ULTRASOUND ABDOMEN LIMITED RIGHT UPPER QUADRANT COMPARISON:  Nuclear medicine hepatobiliary scan earlier today. CT 08/06/2021. Abdominal ultrasound 08/18/2020 FINDINGS: Gallbladder: Physiologically distended. Layering sludge and possibly small stones. No gallbladder wall thickening. No sonographic Murphy sign noted by sonographer. Common bile duct: Diameter: 4-5 mm. Liver: No intrahepatic biliary ductal dilatation. No focal lesion identified. Within normal limits in parenchymal echogenicity. Portal vein is patent on color Doppler imaging with normal direction of blood flow towards the liver. Other: Right hydronephrosis as seen on CT yesterday. Small amount of right upper quadrant free fluid is seen on CT yesterday. IMPRESSION: 1. Layering gallbladder sludge and possibly small stones. 2. No sonographic findings of acute cholecystitis. No biliary dilatation or abnormal gallbladder distension. Electronically Signed   By: Keith Rake M.D.   On: 08/07/2021 22:45    Recent Labs: Lab Results  Component Value Date   WBC 14.8 (H) 08/10/2021   HGB 8.4 (L) 08/10/2021   PLT 144 (L) 08/10/2021   NA 130 (L) 08/10/2021   K 3.6 08/10/2021   CL 96 (L) 08/10/2021   CO2 17 (L) 08/10/2021   GLUCOSE 105 (H) 08/10/2021   BUN 45 (H) 08/10/2021   CREATININE 8.14 (H) 08/10/2021   BILITOT 0.7 08/06/2021   ALKPHOS 126 08/06/2021   AST 19 08/06/2021   ALT 13 08/06/2021   PROT 6.1 (L) 08/06/2021   ALBUMIN 2.4 (L) 08/06/2021   CALCIUM 9.2 08/10/2021   GFRAA 6 (L) 02/02/2020    Speciality Comments: No specialty comments available.  Procedures:  No procedures performed Allergies: Baclofen, Tape, Other, Codeine, and Dextromethorphan-guaifenesin   Assessment / Plan:     Visit Diagnoses: Elevated sed rate  Temporal arteritis syndrome (HCC)  Thrombocytopenia (HCC)  Lumbar discitis  Septic shock (HCC)  ESRD on peritoneal dialysis ([place])  Mobitz type 2 second degree AV block  Pacemaker Medtronic Azure XT  DR MRI Dual Chamber Pacemaker 03/25/2018  Chronic heart failure with preserved ejection fraction (HCC)  History of gastroesophageal reflux (GERD)  Gastric polyp  Secondary hyperparathyroidism of renal origin ([place])  History of hypothyroidism  Hyperlipidemia associated with type 2 diabetes mellitus ([place])  Diet-controlled diabetes mellitus ([place])  Interstitial cystitis  Acquired hydronephrosis with ureteropelvic junction (UPJ) obstruction  Hypoalbuminemia due to protein-calorie malnutrition ([place])  Orders: No orders of the defined types were placed in this encounter.  No orders of the defined types were placed in this encounter.   Face-to-face time spent with patient was *** minutes. Greater than 50% of time was spent in counseling and coordination of care.  Follow-Up Instructions: No follow-ups on file.   Ofilia Neas, PA-C  Note - This record has been created using Dragon software.  Chart creation errors have been sought, but may not always  have been located. Such creation errors do not reflect on  the standard of medical care.

## 2021-08-10 NOTE — Care Management Important Message (Signed)
Important Message  Patient Details  Name: Erika Fry MRN: [id number] Date of Birth: 10-22-1946   Medicare Important Message Given:  Yes     [name] 08/10/2021, 4:19 PM

## 2021-08-10 NOTE — Progress Notes (Signed)
[facility] Progress Note  Subjective: seen in room, no c/o's. BP's lower in the 90's this am.   Vitals:   08/10/21 0630 08/10/21 0700 08/10/21 0814 08/10/21 1226  BP: (!) 98/47 (!) 93/46    Pulse: 60 60    Resp: 13 14    Temp:   98.2 F (36.8 C) 97.8 F (36.6 C)  TempSrc:   Oral Oral  SpO2: 98% 95%    Weight:      Height:        Exam: General: WDWN; NAD; on RA Lungs: CTA bilaterally. No wheeze, rales or rhonchi. Breathing is unlabored. Heart: RRR. No murmur, rubs or gallops.  Abdomen: soft, nontender, active bowel sounds, RLQ PD cath intact Lower extremities: no edema but noted discoloration bilat lower legs Neuro: AAOx3. Moves all extremities spontaneously. Dialysis Access: RUQ PD catheter, RUA AVF+bruit   Dialysis: CCPD 7x/week, [facility]   6 exchanges, dwell 1.5hrs, fill vol 2800cc, 80kg dry wt    Assessment/Plan: Cholecysitis - +HIDA scan showed non-visualized GB. US showed sludge and stones. SP lap chole on 9/08, also getting IV abx Syncopal episode/Dizziness/Hypotension- sp 3 L bolus in ED, getting midodrine here and BP's better. Off pressors. Wt's off today. No vol excess on exam.  ESRD - on CPPD at home. Will need temporary HD while waiting for surgical wounds to heal. Has R arm AVF. Plan dialysis today/ Friday.  Anemia of CKD - Hgb 9.6-monitor trends, may need ESA if indicated. Secondary Hyperparathyroidism - Ca and PO4 okay-monitor trends. Nutrition -  advance to renal diet with fluid restriction when clinically stable       Erika Fry 08/10/2021, 12:56 PM   Recent Labs  Lab 08/08/21 0505 08/09/21 0322 08/10/21 0401  K 3.2* 3.2* 3.6  BUN 44* 41* 45*  CREATININE 7.87* 7.14* 8.14*  CALCIUM 9.4 9.5 9.2  PHOS 5.3* 5.3*  --   HGB 8.3* 8.4* 8.4*    Inpatient medications:  acetaminophen  650 mg Oral Q6H   vitamin C  500 mg Oral BID   Chlorhexidine Gluconate Cloth  6 each Topical Q0600   Chlorhexidine Gluconate Cloth  6 each Topical Q0600    escitalopram  5 mg Oral Daily   ferric citrate  420 mg Oral TID WC   gentamicin cream  1 application Topical Daily   heparin  5,000 Units Subcutaneous Q8H   insulin aspart  0-6 Units Subcutaneous Q4H   levothyroxine  75 mcg Oral Q0600   midodrine  5 mg Oral TID WC   pantoprazole  40 mg Oral QHS   senna  1 tablet Oral QHS   simvastatin  40 mg Oral q1800   vitamin A  10,000 Units Oral Daily   zinc sulfate  220 mg Oral BID    sodium chloride 10 mL/hr at 08/10/21 0700   dialysis solution 1.5% low-MG/low-CA Stopped (08/10/21 0000)   dialysis solution 2.5% low-MG/low-CA Stopped (08/10/21 0000)   piperacillin-tazobactam 2.25 g (08/10/21 1040)   diclofenac Sodium, docusate sodium, fentaNYL (SUBLIMAZE) injection, dianeal solution for CAPD/CCPD with heparin, HYDROcodone-acetaminophen, ondansetron (ZOFRAN) IV, oxyCODONE, polyethylene glycol

## 2021-08-10 NOTE — Progress Notes (Signed)
Initial Nutrition Assessment  DOCUMENTATION CODES:  Not applicable  INTERVENTION:  - Boost Breeze po TID, each supplement provides 250 kcal and 9 grams of protein  - ProSource Plus 30 ml po BID, each supplement provides 100 kcal and 15 grams of protein  - RD will monitor for diet advancement and adjust oral nutrition supplement regimen as appropriate  NUTRITION DIAGNOSIS:  Increased nutrient needs related to post-op healing as evidenced by estimated needs.  GOAL:  Patient will meet greater than or equal to 90% of their needs  MONITOR:  PO intake, Supplement acceptance, Diet advancement, Labs, Weight trends, Skin, I & O's  REASON FOR ASSESSMENT:  Rounds    ASSESSMENT:  75 year old female who presented to the ED on 9/05 with hypotension and near syncopal episode. PMH of ESRD on PD, AV block with pacemaker, DM, GERD, HLD, hypothyroidism. Pt admitted with sepsis, cholelithiasis.  9/08 - s/p lap cholecystectomy  Discussed pt with RN and during ICU rounds. Pt currently with clear liquid diet.  Attempted to speak with pt at bedside; however, pt currently in HD. Per RN, pt doing well on clear liquid diet and likely to have diet advanced soon. RD to order clear liquids oral nutrition supplements.  Pt receiving HD while admitted with plans to transition back to PD at discharge.  Admit weight: 83.4 kg Current weight: 88 kg Dry weight: 80 kg  Per nursing edema assessment, pt with non-pitting edema to BUE and BLE.  CCPD: 7x/week 4.25% bags 6 exchanges Fill volume 2800 ml Cyc therapy volume 1680 ml Average dwell time 1 hr 30 min No day exchanges  Medications reviewed and include: vitamin C 500 mg BID, ferric citrate 420 mg TID, SSI q 4 hours, protonix, senna, vitamin A 10,000 units daily x 14 days, zinc sulfate 220 mg BID x 14 days, IV abx  Labs reviewed: sodium 130, phosphorus 5.3, hemoglobin 8.4, hemoglobin A1C 6.2 CBG's: 104-166 x 24 hours  I/O's: +8.9 L since  admit  NUTRITION - FOCUSED PHYSICAL EXAM:  Unable to complete at this time.  Diet Order:   Diet Order             Diet clear liquid Room service appropriate? Yes; Fluid consistency: Thin  Diet effective now                   EDUCATION NEEDS:  No education needs have been identified at this time  Skin:  Skin Assessment: Skin Integrity Issues: DTI: L buttocks  Last BM:  08/08/21 medium type 6  Height:   Ht Readings from Last 1 Encounters:  08/09/21 5\' 5"  (1.651 m)    Weight:   Wt Readings from Last 1 Encounters:  08/10/21 87.6 kg    BMI:  Body mass index is 32.14 kg/m.  Estimated Nutritional Needs:   Kcal:  1700-1900  Protein:  95-115 grams  Fluid:  1.5 L/day    Gustavus Bryant, MS, RD, LDN Inpatient Clinical Dietitian Please see AMiON for contact information.

## 2021-08-10 NOTE — Discharge Instructions (Signed)
[facility]  Please arrive at least 30 min before your appointment to complete your check in paperwork.  If you are unable to arrive 30 min prior to your appointment time we may have to cancel or reschedule you. LAPAROSCOPIC SURGERY: POST OP INSTRUCTIONS Always review your discharge instruction sheet given to you by the facility where your surgery was performed. IF YOU HAVE DISABILITY OR FAMILY LEAVE FORMS, YOU MUST BRING THEM TO THE OFFICE FOR PROCESSING.   DO NOT GIVE THEM TO YOUR DOCTOR.  PAIN CONTROL  First take acetaminophen (Tylenol) AND/or ibuprofen (Advil) to control your pain after surgery.  Follow directions on package.  Taking acetaminophen (Tylenol) and/or ibuprofen (Advil) regularly after surgery will help to control your pain and lower the amount of prescription pain medication you may need.  You should not take more than 4,000 mg (4 grams) of acetaminophen (Tylenol) in 24 hours.  You should not take ibuprofen (Advil), aleve, motrin, naprosyn or other NSAIDS if you have a history of stomach ulcers or chronic kidney disease.  A prescription for pain medication may be given to you upon discharge.  Take your pain medication as prescribed, if you still have uncontrolled pain after taking acetaminophen (Tylenol) or ibuprofen (Advil). Use ice packs to help control pain. If you need a refill on your pain medication, please contact your pharmacy.  They will contact our office to request authorization. Prescriptions will not be filled after 5pm or on week-ends.  HOME MEDICATIONS Take your usually prescribed medications unless otherwise directed.  DIET You should follow a light diet the first few days after arrival home.  Be sure to include lots of fluids daily. Avoid fatty, fried foods.   CONSTIPATION It is common to experience some constipation after surgery and if you are taking pain medication.  Increasing fluid intake and taking a stool softener (such as Colace)  will usually help or prevent this problem from occurring.  A mild laxative (Milk of Magnesia or Miralax) should be taken according to package instructions if there are no bowel movements after 48 hours.  WOUND/INCISION CARE Most patients will experience some swelling and bruising in the area of the incisions.  Ice packs will help.  Swelling and bruising can take several days to resolve.  Unless discharge instructions indicate otherwise, follow guidelines below  STERI-STRIPS - you may remove your outer bandages 48 hours after surgery, and you may shower at that time.  You have steri-strips (small skin tapes) in place directly over the incision.  These strips should be left on the skin for 7-10 days.   DERMABOND/SKIN GLUE - you may shower in 24 hours.  The glue will flake off over the next 2-3 weeks. Any sutures or staples will be removed at the office during your follow-up visit.  ACTIVITIES You may resume regular (light) daily activities beginning the next day--such as daily self-care, walking, climbing stairs--gradually increasing activities as tolerated.  You may have sexual intercourse when it is comfortable.  Refrain from any heavy lifting or straining until approved by your doctor. You may drive when you are no longer taking prescription pain medication, you can comfortably wear a seatbelt, and you can safely maneuver your car and apply brakes.  FOLLOW-UP You should see your doctor in the office for a follow-up appointment approximately 2-3 weeks after your surgery.  You should have been given your post-op/follow-up appointment when your surgery was scheduled.  If you did not receive a post-op/follow-up appointment, make sure   that you call for this appointment within a day or two after you arrive home to insure a convenient appointment time.   WHEN TO CALL YOUR DOCTOR: Fever over 101.0 Inability to urinate Continued bleeding from incision. Increased pain, redness, or drainage from the  incision. Increasing abdominal pain  The clinic staff is available to answer your questions during regular business hours.  Please don't hesitate to call and ask to speak to one of the nurses for clinical concerns.  If you have a medical emergency, go to the nearest emergency room or call 911.  A surgeon from [facility] is always on call at the hospital. [address], [address], [place], [place]  [address] ? [address] [address], [place], [place]   [address] [phone] ? [phone] ? FAX [phone]  

## 2021-08-10 NOTE — Progress Notes (Signed)
PROGRESS NOTE    Erika Fry  BUL:[id number] DOB: [birth year] DOA: 08/06/2021 PCP: [name], MD   Brief Narrative: 75 year old with past medical history significant for ESRD on peritoneal dialysis, AV block with pacemaker, diabetes, GERD, hyperlipidemia, hypothyroidism, arthritis who presented with generalized weakness and dizziness.  Patient ran out of her midodrine for over a week secondary to shortage at the pharmacy.  She had a syncopal episode day prior to admission.  Presented to the ED because she remained dizzy, complaining of abdominal pain after eating and reduced appetite.  Patient was initially hypotensive with systolic blood pressure in the 60 improved with 3.5 Lof fluids , MAP remains less than 65 so she was a started on peripheral Levophed.  Lactic acid up to 3.0.  CT abdomen and pelvis showed gallbladder stone and layering sludge without evidence of cholecystitis with moderate to severe right side hydronephrosis without obstructing calculus.   Patient was admitted with septic shock secondary to cholecystitis.  HIDA scan positive for cholecystitis.  Plan for cholecystectomy 9/8.  Patient care transfer From CCM service to triad on 9/8.  Assessment & Plan:   Principal Problem:   Septic shock ([place]) Active Problems:   Chronic kidney disease (CKD), stage IV (severe) (HCC)   Hypothyroidism   Hypokalemia   Chronic heart failure with preserved ejection fraction (HCC)   Pacemaker Medtronic Azure XT DR MRI Dual Chamber Pacemaker 03/25/2018   Diabetes mellitus type 2 in obese ([place])   ESRD on peritoneal dialysis ([place])   Hypotension   Pain of upper abdomen  1-Septic shock, component of hypovolemic shock, intra-abdominal source of infection, cholecystitis: -Patient presented with hypotension which did not improve with IV fluids.  She was a started on Levophed. -Patient presented with leukocytosis, hypotension, lactic acidosis, source of infection cholecystitis. -She is  now transition to home dose midodrine.  White blood cell trending down. -Continue with IV Zosyn -Cholecystectomy 9/8 -Cortisol at 10.  She was a started on IV steroids. Will discontinue Steroids today.  -BP stable.   2-Acute cholecystitis, cholelithiasis: -HIDA scan positive for cholecystitis.  Ultrasound positive for gallbladder sludge and possible small stone. -Patient presented with leukocytosis and postprandial abdominal pain.  Septic shock. -Underwent cholecystectomy 9/8 -Appreciate Surgery evaluation. -Norco and fentanyl PN for pain.  - Tolerating clear diet. Advanced diet as tolerated.   3-ESRD on peritoneal dialysis: Nephrology following. Will need to transition to hemodialysis for 2 weeks post surgery. Needs Clip process.   4-Hypokalemia, hyponatremia: Correction with dialysis  5-Anion gap metabolic acidosis: Gap of 16.  Secondary to his ESRD and lactic acidosis Hypothyroidism: Continue with Synthroid  6-Chronic dyspnea, DLCO/VA 83% consistent with mild obstructive disease. Continue with PRN albuterol  7-Chronic leg pain: Continue with hydrocodone Hyperglycemia: Suspect secondary to stress dose steroid: Continue with a sliding scale. -Monitor off steroids.   The wound care documentation below: POA                  Pressure Injury 08/07/21 Buttocks Left Deep Tissue Pressure Injury - Purple or maroon localized area of discolored intact skin or blood-filled blister due to damage of underlying soft tissue from pressure and/or shear. (Active)  08/07/21 0100  Location: Buttocks  Location Orientation: Left  Staging: Deep Tissue Pressure Injury - Purple or maroon localized area of discolored intact skin or blood-filled blister due to damage of underlying soft tissue from pressure and/or shear.  Wound Description (Comments):   Present on Admission: Yes       Estimated  body mass index is 32.28 kg/m as calculated from the following:   Height as of this encounter: 5'  5" (1.651 m).   Weight as of this encounter: 88 kg.   DVT prophylaxis: Heparin Code Status: Full code Family Communication: Care Discussed  With patient Disposition Plan:  Status is: Inpatient  Remains inpatient appropriate because:IV treatments appropriate due to intensity of illness or inability to take PO  Dispo: The patient is from: Home              Anticipated d/c is to:  To be determined              Patient currently is not medically stable to d/c.   Difficult to place patient No        Consultants:  General surgery CCM admitted patient  Procedures:  Cholecystectomy  9/8  Antimicrobials:    Subjective: She is feeling well, report some abdominal discomfort. Tolerating clear. Passing some gas.   Objective: Vitals:   08/10/21 0600 08/10/21 0630 08/10/21 0700 08/10/21 0814  BP: (!) 96/57 (!) 98/47 (!) 93/46   Pulse: 66 60 60   Resp: 16 13 14    Temp:    98.2 F (36.8 C)  TempSrc:    Oral  SpO2: 99% 98% 95%   Weight:      Height:        Intake/Output Summary (Last 24 hours) at 08/10/2021 0845 Last data filed at 08/10/2021 0700 Gross per 24 hour  Intake 988.64 ml  Output 25 ml  Net 963.64 ml    Filed Weights   08/07/21 0354 08/09/21 0905 08/10/21 0330  Weight: 83.4 kg 77.1 kg 88 kg    Examination:  General exam: NAD Respiratory system: CTA Cardiovascular system: S 1, S 2 RRR Gastrointestinal system: BS present, soft, two small incision glue, clean, peritoneal catheter in place.  Central nervous system: Alert, oriented, follows command Extremities: Symmetric power   Data Reviewed: I have personally reviewed following labs and imaging studies  CBC: Recent Labs  Lab 08/06/21 1925 08/07/21 0626 08/08/21 0505 08/09/21 0322 08/10/21 0401  WBC 14.1* 17.8* 13.1* 12.0* 14.8*  NEUTROABS 10.1*  --   --   --   --   HGB 9.9* 9.6* 8.3* 8.4* 8.4*  HCT 29.2* 29.0* 24.8* 24.7* 24.4*  MCV 96.7 96.3 95.4 93.9 94.9  PLT 131* 160 138* 159 144*     Basic Metabolic Panel: Recent Labs  Lab 08/06/21 1725 08/07/21 0010 08/07/21 0626 08/08/21 0505 08/09/21 0322 08/10/21 0401  NA 131*  --  131* 128* 129* 130*  K 2.7*  --  3.2* 3.2* 3.2* 3.6  CL 91*  --  99 95* 96* 96*  CO2 21*  --  18* 17* 17* 17*  GLUCOSE 168*  --  197* 233* 246* 105*  BUN 38*  --  38* 44* 41* 45*  CREATININE 8.50* 6.70* 7.57* 7.87* 7.14* 8.14*  CALCIUM 9.6  --  9.1 9.4 9.5 9.2  MG  --  1.2* 1.5* 2.2 1.9  --   PHOS  --   --  5.6* 5.3* 5.3*  --     GFR: Estimated Creatinine Clearance: 6.3 mL/min (A) (by C-G formula based on SCr of 8.14 mg/dL (H)). Liver Function Tests: Recent Labs  Lab 08/06/21 1725  AST 19  ALT 13  ALKPHOS 126  BILITOT 0.7  PROT 6.1*  ALBUMIN 2.4*    Recent Labs  Lab 08/06/21 1725  LIPASE 28    No  results for input(s): AMMONIA in the last 168 hours. Coagulation Profile: Recent Labs  Lab 08/06/21 1725  INR 1.0    Cardiac Enzymes: No results for input(s): CKTOTAL, CKMB, CKMBINDEX, TROPONINI in the last 168 hours. BNP (last 3 results) No results for input(s): PROBNP in the last 8760 hours. HbA1C: No results for input(s): HGBA1C in the last 72 hours.  CBG: Recent Labs  Lab 08/09/21 1504 08/09/21 1944 08/09/21 2310 08/10/21 0326 08/10/21 0740  GLUCAP 163* 166* 131* 118* 104*    Lipid Profile: No results for input(s): CHOL, HDL, LDLCALC, TRIG, CHOLHDL, LDLDIRECT in the last 72 hours. Thyroid Function Tests: No results for input(s): TSH, T4TOTAL, FREET4, T3FREE, THYROIDAB in the last 72 hours.  Anemia Panel: No results for input(s): VITAMINB12, FOLATE, FERRITIN, TIBC, IRON, RETICCTPCT in the last 72 hours. Sepsis Labs: Recent Labs  Lab 08/06/21 1725 08/06/21 1925  LATICACIDVEN 3.0* 2.1*     Recent Results (from the past 240 hour(s))  Resp Panel by RT-PCR (Flu A&B, Covid) Nasopharyngeal Swab     Status: None   Collection Time: 08/06/21  5:25 PM   Specimen: Nasopharyngeal Swab; Nasopharyngeal(NP)  swabs in vial transport medium  Result Value Ref Range Status   SARS Coronavirus 2 by RT PCR NEGATIVE NEGATIVE Final    Comment: (NOTE) SARS-CoV-2 target nucleic acids are NOT DETECTED.  The SARS-CoV-2 RNA is generally detectable in upper respiratory specimens during the acute phase of infection. The lowest concentration of SARS-CoV-2 viral copies this assay can detect is 138 copies/mL. A negative result does not preclude SARS-Cov-2 infection and should not be used as the sole basis for treatment or other patient management decisions. A negative result may occur with  improper specimen collection/handling, submission of specimen other than nasopharyngeal swab, presence of viral mutation(s) within the areas targeted by this assay, and inadequate number of viral copies(<138 copies/mL). A negative result must be combined with clinical observations, patient history, and epidemiological information. The expected result is Negative.  Fact Sheet for Patients:  EntrepreneurPulse.com.au  Fact Sheet for Healthcare Providers:  IncredibleEmployment.be  This test is no t yet approved or cleared by the [place] FDA and  has been authorized for detection and/or diagnosis of SARS-CoV-2 by FDA under an Emergency Use Authorization (EUA). This EUA will remain  in effect (meaning this test can be used) for the duration of the COVID-19 declaration under Section 564(b)(1) of the Act, 21 U.S.C.section 360bbb-3(b)(1), unless the authorization is terminated  or revoked sooner.       Influenza A by PCR NEGATIVE NEGATIVE Final   Influenza B by PCR NEGATIVE NEGATIVE Final    Comment: (NOTE) The Xpert Xpress SARS-CoV-2/FLU/RSV plus assay is intended as an aid in the diagnosis of influenza from Nasopharyngeal swab specimens and should not be used as a sole basis for treatment. Nasal washings and aspirates are unacceptable for Xpert Xpress  SARS-CoV-2/FLU/RSV testing.  Fact Sheet for Patients: EntrepreneurPulse.com.au  Fact Sheet for Healthcare Providers: IncredibleEmployment.be  This test is not yet approved or cleared by the [place] FDA and has been authorized for detection and/or diagnosis of SARS-CoV-2 by FDA under an Emergency Use Authorization (EUA). This EUA will remain in effect (meaning this test can be used) for the duration of the COVID-19 declaration under Section 564(b)(1) of the Act, 21 U.S.C. section 360bbb-3(b)(1), unless the authorization is terminated or revoked.  Performed at [facility], [place] [address], [place], [place] [address]   Blood Culture (routine x 2)  Status: None (Preliminary result)   Collection Time: 08/06/21  5:25 PM   Specimen: BLOOD RIGHT FOREARM  Result Value Ref Range Status   Specimen Description BLOOD RIGHT FOREARM  Final   Special Requests   Final    BOTTLES DRAWN AEROBIC AND ANAEROBIC Blood Culture results may not be optimal due to an inadequate volume of blood received in culture bottles   Culture   Final    NO GROWTH 3 DAYS Performed at [facility], [place] [address], [place], [place] [address]    Report Status PENDING  Incomplete  Blood Culture (routine x 2)     Status: None (Preliminary result)   Collection Time: 08/06/21  5:30 PM   Specimen: BLOOD RIGHT FOREARM  Result Value Ref Range Status   Specimen Description BLOOD RIGHT FOREARM  Final   Special Requests   Final    BOTTLES DRAWN AEROBIC AND ANAEROBIC Blood Culture results may not be optimal due to an inadequate volume of blood received in culture bottles   Culture   Final    NO GROWTH 3 DAYS Performed at [facility], [place] [address] [place], [place] [address]    Report Status PENDING  Incomplete  MRSA Next Gen by PCR, Nasal     Status: None   Collection Time: 08/07/21 12:54 AM   Specimen: Nasal Mucosa; Nasal Swab  Result Value Ref Range Status    MRSA by PCR Next Gen NOT DETECTED NOT DETECTED Final    Comment: (NOTE) The GeneXpert MRSA Assay (FDA approved for NASAL specimens only), is one component of a comprehensive MRSA colonization surveillance program. It is not intended to diagnose MRSA infection nor to guide or monitor treatment for MRSA infections. Test performance is not FDA approved in patients less than 9 years old. Performed at [facility], [place] [address], [place], [place] [address]           Radiology Studies: DG Knee 1-2 Views Left  Result Date: 08/08/2021 CLINICAL DATA:  Fall on left knee at home. Pain and swelling. Can not bear weight. EXAM: LEFT KNEE - 1-2 VIEW COMPARISON:  None. FINDINGS: No fracture. Medial tibiofemoral joint space narrowing with near complete joint space loss. Moderate tricompartmental peripheral spurring. There is a small knee joint effusion without lipohemarthrosis. Punctate density projects in the medial soft tissues overlying the distal femur. Vascular calcifications are seen. Mild soft tissue edema. IMPRESSION: 1. No acute fracture or dislocation of the left knee. 2. Moderate osteoarthritis with near complete joint space loss in the medial tibiofemoral compartment. Small knee joint effusion. Electronically Signed   By: Keith Rake M.D.   On: 08/08/2021 13:15        Scheduled Meds:  acetaminophen  650 mg Oral Q6H   vitamin C  500 mg Oral BID   Chlorhexidine Gluconate Cloth  6 each Topical Q0600   Chlorhexidine Gluconate Cloth  6 each Topical Q0600   escitalopram  5 mg Oral Daily   gentamicin cream  1 application Topical Daily   heparin  5,000 Units Subcutaneous Q8H   insulin aspart  0-6 Units Subcutaneous Q4H   levothyroxine  75 mcg Oral Q0600   midodrine  5 mg Oral TID WC   pantoprazole  40 mg Oral QHS   senna  1 tablet Oral QHS   simvastatin  40 mg Oral q1800   vitamin A  10,000 Units Oral Daily   zinc sulfate  220 mg Oral BID  Continuous Infusions:  sodium  chloride 10 mL/hr at 08/10/21 0700   dialysis solution 1.5% low-MG/low-CA Stopped (08/10/21 0000)   dialysis solution 2.5% low-MG/low-CA Stopped (08/10/21 0000)   piperacillin-tazobactam Stopped (08/10/21 0631)     LOS: 4 days    Time spent: 35 minutes.     Elmarie Shiley, MD [facility]   If 7PM-7AM, please contact night-coverage [email or web address]  08/10/2021, 8:45 AM

## 2021-08-10 NOTE — Progress Notes (Addendum)
Advised of pt's need to transition to HD from PD. Message left at [facility] and awaiting a return call.Met with pt at bedside. Will provide update to pt and staff once schedule has been determined.   Melven Sartorius  Renal Navigator [phone]    Addendum:  Spoke to pt's PD RN, Jenny Reichmann, at [facility]. Pt will need to go to [facility] at d/c for HD on Monday, Tuesday, Thursday, and Friday. Pt's appt time is 1:20. Pt can start on Monday should pt be stable for d/c over the weekend. Met with pt at bedside and discussed the above. Pt agreeable to plan and provided information sheet with above info noted. Spoke to Dr Jonnie Finner as well.

## 2021-08-10 NOTE — Progress Notes (Signed)
1 Day Post-Op   Subjective/Chief Complaint: Comfortable this morning Just starting liquids   Objective: Vital signs in last 24 hours: Temp:  [97.6 F (36.4 C)-99.1 F (37.3 C)] 98.5 F (36.9 C) (09/09 0330) Pulse Rate:  [59-68] 60 (09/09 0700) Resp:  [12-23] 14 (09/09 0700) BP: (82-171)/(42-114) 93/46 (09/09 0700) SpO2:  [90 %-100 %] 95 % (09/09 0700) Weight:  [77.1 kg-88 kg] 88 kg (09/09 0330) Last BM Date: 08/07/21  Intake/Output from previous day: 09/08 0701 - 09/09 0700 In: 988.6 [P.O.:360; I.V.:478.6; IV Piggyback:150] Out: 25 [Blood:25] Intake/Output this shift: No intake/output data recorded.  Exam: Awake and alert Looks comfortable Abdomen soft, appropriately tender  Lab Results:  Recent Labs    08/09/21 0322 08/10/21 0401  WBC 12.0* 14.8*  HGB 8.4* 8.4*  HCT 24.7* 24.4*  PLT 159 144*   BMET Recent Labs    08/09/21 0322 08/10/21 0401  NA 129* 130*  K 3.2* 3.6  CL 96* 96*  CO2 17* 17*  GLUCOSE 246* 105*  BUN 41* 45*  CREATININE 7.14* 8.14*  CALCIUM 9.5 9.2   PT/INR No results for input(s): LABPROT, INR in the last 72 hours. ABG No results for input(s): PHART, HCO3 in the last 72 hours.  Invalid input(s): PCO2, PO2  Studies/Results: DG Knee 1-2 Views Left  Result Date: 08/08/2021 CLINICAL DATA:  Fall on left knee at home. Pain and swelling. Can not bear weight. EXAM: LEFT KNEE - 1-2 VIEW COMPARISON:  None. FINDINGS: No fracture. Medial tibiofemoral joint space narrowing with near complete joint space loss. Moderate tricompartmental peripheral spurring. There is a small knee joint effusion without lipohemarthrosis. Punctate density projects in the medial soft tissues overlying the distal femur. Vascular calcifications are seen. Mild soft tissue edema. IMPRESSION: 1. No acute fracture or dislocation of the left knee. 2. Moderate osteoarthritis with near complete joint space loss in the medial tibiofemoral compartment. Small knee joint effusion.  Electronically Signed   By: Keith Rake M.D.   On: 08/08/2021 13:15    Anti-infectives: Anti-infectives (From admission, onward)    Start     Dose/Rate Route Frequency Ordered Stop   08/07/21 1730  ceFEPIme (MAXIPIME) 1 g in sodium chloride 0.9 % 100 mL IVPB  Status:  Discontinued        1 g 200 mL/hr over 30 Minutes Intravenous Every 24 hours 08/06/21 1734 08/07/21 0724   08/07/21 1300  piperacillin-tazobactam (ZOSYN) IVPB 2.25 g        2.25 g 100 mL/hr over 30 Minutes Intravenous Every 8 hours 08/07/21 1212     08/07/21 0830  ceFEPIme (MAXIPIME) 1 g in sodium chloride 0.9 % 100 mL IVPB  Status:  Discontinued        1 g 200 mL/hr over 30 Minutes Intravenous Every 24 hours 08/07/21 0724 08/07/21 1212   08/06/21 1830  vancomycin (VANCOREADY) IVPB 1750 mg/350 mL        1,750 mg 175 mL/hr over 120 Minutes Intravenous  Once 08/06/21 1727 08/06/21 2054   08/06/21 1734  vancomycin variable dose per unstable renal function (pharmacist dosing)  Status:  Discontinued         Does not apply See admin instructions 08/06/21 1734 08/07/21 1212   08/06/21 1730  ceFEPIme (MAXIPIME) 2 g in sodium chloride 0.9 % 100 mL IVPB  Status:  Discontinued        2 g 200 mL/hr over 30 Minutes Intravenous  Once 08/06/21 1727 08/06/21 1734   08/06/21 1730  metroNIDAZOLE (  FLAGYL) IVPB 500 mg        500 mg 100 mL/hr over 60 Minutes Intravenous  Once 08/06/21 1727 08/06/21 1927       Assessment/Plan: s/p Procedure(s): LAPAROSCOPIC CHOLECYSTECTOMY (N/A)  Acute cholecystitis  POD#1 s/p lap chole  Stable post op Advance diet as tolerated    Coralie Keens MD 08/10/2021

## 2021-08-11 LAB — PHOSPHORUS: Phosphorus: 4.2 mg/dL (ref 2.5–4.6)

## 2021-08-11 LAB — CULTURE, BLOOD (ROUTINE X 2)
Culture: NO GROWTH
Culture: NO GROWTH

## 2021-08-11 LAB — CBC
HCT: 24.2 % — ABNORMAL LOW (ref 36.0–46.0)
Hemoglobin: 8.2 g/dL — ABNORMAL LOW (ref 12.0–15.0)
MCH: 32.7 pg (ref 26.0–34.0)
MCHC: 33.9 g/dL (ref 30.0–36.0)
MCV: 96.4 fL (ref 80.0–100.0)
Platelets: 144 10*3/uL — ABNORMAL LOW (ref 150–400)
RBC: 2.51 MIL/uL — ABNORMAL LOW (ref 3.87–5.11)
RDW: 13.3 % (ref 11.5–15.5)
WBC: 10 10*3/uL (ref 4.0–10.5)
nRBC: 0 % (ref 0.0–0.2)

## 2021-08-11 LAB — GLUCOSE, CAPILLARY
Glucose-Capillary: 104 mg/dL — ABNORMAL HIGH (ref 70–99)
Glucose-Capillary: 129 mg/dL — ABNORMAL HIGH (ref 70–99)
Glucose-Capillary: 146 mg/dL — ABNORMAL HIGH (ref 70–99)
Glucose-Capillary: 85 mg/dL (ref 70–99)
Glucose-Capillary: 90 mg/dL (ref 70–99)

## 2021-08-11 LAB — BASIC METABOLIC PANEL
Anion gap: 16 — ABNORMAL HIGH (ref 5–15)
BUN: 23 mg/dL (ref 8–23)
CO2: 23 mmol/L (ref 22–32)
Calcium: 9.1 mg/dL (ref 8.9–10.3)
Chloride: 94 mmol/L — ABNORMAL LOW (ref 98–111)
Creatinine, Ser: 4.66 mg/dL — ABNORMAL HIGH (ref 0.44–1.00)
GFR, Estimated: 9 mL/min — ABNORMAL LOW (ref >60)
Glucose, Bld: 80 mg/dL (ref 70–99)
Potassium: 3 mmol/L — ABNORMAL LOW (ref 3.5–5.1)
Sodium: 133 mmol/L — ABNORMAL LOW (ref 135–145)

## 2021-08-11 MED ORDER — MIDODRINE HCL 5 MG PO TABS
10.0000 mg | ORAL_TABLET | Freq: Three times a day (TID) | ORAL | Status: DC
  Administered 2021-08-11 – 2021-08-13 (×7): 10 mg via ORAL
  Filled 2021-08-11 (×6): qty 2

## 2021-08-11 MED ORDER — INSULIN ASPART 100 UNIT/ML IJ SOLN
0.0000 [IU] | Freq: Three times a day (TID) | INTRAMUSCULAR | Status: DC
  Administered 2021-08-14: 1 [IU] via SUBCUTANEOUS

## 2021-08-11 MED ORDER — CALCIUM CARBONATE ANTACID 500 MG PO CHEW
1.0000 | CHEWABLE_TABLET | Freq: Three times a day (TID) | ORAL | Status: DC | PRN
  Administered 2021-08-11 (×2): 200 mg via ORAL
  Filled 2021-08-11 (×3): qty 1

## 2021-08-11 MED ORDER — OXYCODONE HCL 5 MG PO TABS
5.0000 mg | ORAL_TABLET | ORAL | Status: DC | PRN
Start: 2021-08-11 — End: 2021-08-17
  Administered 2021-08-15 – 2021-08-17 (×5): 5 mg via ORAL
  Filled 2021-08-11 (×5): qty 1

## 2021-08-11 MED ORDER — ALBUMIN HUMAN 25 % IV SOLN
25.0000 g | Freq: Once | INTRAVENOUS | Status: AC
  Administered 2021-08-11: 25 g via INTRAVENOUS
  Filled 2021-08-11: qty 100

## 2021-08-11 MED ORDER — PANTOPRAZOLE SODIUM 40 MG PO TBEC
40.0000 mg | DELAYED_RELEASE_TABLET | Freq: Two times a day (BID) | ORAL | Status: DC
  Administered 2021-08-11 – 2021-08-17 (×13): 40 mg via ORAL
  Filled 2021-08-11 (×12): qty 1

## 2021-08-11 NOTE — Progress Notes (Signed)
[facility] Progress Note  Subjective: seen in room, no c/o's. BP's lower in the 90's this am.   Vitals:   08/10/21 2146 08/11/21 0426 08/11/21 0929 08/11/21 1138  BP: (!) 114/53 107/86 (!) 91/44   Pulse: 61 72 74   Resp: 18 18 17    Temp: 98 F (36.7 C) 98.4 F (36.9 C) 98.4 F (36.9 C)   TempSrc: Oral Oral Oral   SpO2: 97% 98% 99%   Weight:    87.9 kg  Height:        Exam: General: WDWN; NAD; on RA Lungs: CTA bilaterally. No wheeze, rales or rhonchi. Breathing is unlabored. Heart: RRR. No murmur, rubs or gallops.  Abdomen: soft, nontender, active bowel sounds, RLQ PD cath intact Lower extremities: no edema but noted discoloration bilat lower legs Neuro: AAOx3. Moves all extremities spontaneously. Dialysis Access: RUQ PD catheter, RUA AVF+bruit   Dialysis: CCPD 7x/week, [facility]   6 exchanges, dwell 1.5hrs, fill vol 2800cc, 80kg dry wt    Assessment/Plan: Cholecysitis - +HIDA scan showed non-visualized GB. US showed sludge and stones. SP lap chole on 9/08, also getting IV abx Syncopal episode/Dizziness/Hypotension- sp 3 L bolus in ED, getting midodrine here and BP's better. Off pressors. Wt's off today. No vol excess on exam.  ESRD - on CPPD at home. Will need temporary HD while waiting for surgical wounds to heal. Has R arm AVF. Had HD yesterday, next HD Monday BP/ volume - chronic hypotension, on midodrine 5 bid at home. Will ^ to 10 tid here. Pt is vol overloaded, stood to stand today up and is 11kg up.  On room air, no resp issues. BP's soft but will need to get more volume off w/ HD next week, may need some serial HD.  Anemia of CKD - Hgb 9.6-monitor trends, may need ESA if indicated. Secondary Hyperparathyroidism - Ca and PO4 okay-monitor trends. Nutrition -  advance to renal diet with fluid restriction when clinically stable       Erika Fry 08/11/2021, 12:19 PM   Recent Labs  Lab 08/09/21 0322 08/10/21 0401 08/11/21 0416 08/11/21 0830  K 3.2*  3.6 3.0*  --   BUN 41* 45* 23  --   CREATININE 7.14* 8.14* 4.66*  --   CALCIUM 9.5 9.2 9.1  --   PHOS 5.3*  --  4.2  --   HGB 8.4* 8.4*  --  8.2*    Inpatient medications:  (feeding supplement) PROSource Plus  30 mL Oral BID BM   acetaminophen  650 mg Oral Q6H   vitamin C  500 mg Oral BID   Chlorhexidine Gluconate Cloth  6 each Topical Q0600   Chlorhexidine Gluconate Cloth  6 each Topical Q0600   escitalopram  5 mg Oral Daily   feeding supplement  1 Container Oral TID BM   ferric citrate  420 mg Oral TID WC   gentamicin cream  1 application Topical Daily   heparin  5,000 Units Subcutaneous Q8H   insulin aspart  0-6 Units Subcutaneous TID WC   levothyroxine  75 mcg Oral Q0600   midodrine  5 mg Oral TID WC   pantoprazole  40 mg Oral BID   senna  1 tablet Oral QHS   simvastatin  40 mg Oral q1800   vitamin A  10,000 Units Oral Daily   zinc sulfate  220 mg Oral BID    sodium chloride 10 mL/hr at 08/10/21 0700   piperacillin-tazobactam 2.25 g (08/11/21 0737)  calcium carbonate, diclofenac Sodium, docusate sodium, fentaNYL (SUBLIMAZE) injection, dianeal solution for CAPD/CCPD with heparin, HYDROcodone-acetaminophen, ondansetron (ZOFRAN) IV, oxyCODONE, polyethylene glycol

## 2021-08-11 NOTE — Progress Notes (Signed)
MD Regalado made aware of patient BP of 82/46. Patient only complains of some light headedness/dizziness with sudden movement. New orders placed for albumin.

## 2021-08-11 NOTE — Progress Notes (Signed)
2 Days Post-Op   Subjective/Chief Complaint: PT doing well this  AM Tol POGERD   Objective: Vital signs in last 24 hours: Temp:  [97.8 F (36.6 C)-98.7 F (37.1 C)] 98.4 F (36.9 C) (09/10 0426) Pulse Rate:  [59-98] 72 (09/10 0426) Resp:  [12-19] 18 (09/10 0426) BP: (93-125)/(46-86) 107/86 (09/10 0426) SpO2:  [95 %-100 %] 98 % (09/10 0426) Weight:  [87.6 kg] 87.6 kg (09/09 1730) Last BM Date: 08/07/21  Intake/Output from previous day: 09/09 0701 - 09/10 0700 In: 410 [P.O.:360; IV Piggyback:50] Out: 0  Intake/Output this shift: No intake/output data recorded.  General appearance: alert and cooperative GI: soft, non-tender; bowel sounds normal; no masses,  no organomegaly and inc c/d/i  Lab Results:  Recent Labs    08/10/21 0401 08/11/21 0830  WBC 14.8* 10.0  HGB 8.4* 8.2*  HCT 24.4* 24.2*  PLT 144* 144*   BMET Recent Labs    08/10/21 0401 08/11/21 0416  NA 130* 133*  K 3.6 3.0*  CL 96* 94*  CO2 17* 23  GLUCOSE 105* 80  BUN 45* 23  CREATININE 8.14* 4.66*  CALCIUM 9.2 9.1   PT/INR No results for input(s): LABPROT, INR in the last 72 hours. ABG No results for input(s): PHART, HCO3 in the last 72 hours.  Invalid input(s): PCO2, PO2  Studies/Results: No results found.  Anti-infectives: Anti-infectives (From admission, onward)    Start     Dose/Rate Route Frequency Ordered Stop   08/07/21 1730  ceFEPIme (MAXIPIME) 1 g in sodium chloride 0.9 % 100 mL IVPB  Status:  Discontinued        1 g 200 mL/hr over 30 Minutes Intravenous Every 24 hours 08/06/21 1734 08/07/21 0724   08/07/21 1300  piperacillin-tazobactam (ZOSYN) IVPB 2.25 g        2.25 g 100 mL/hr over 30 Minutes Intravenous Every 8 hours 08/07/21 1212     08/07/21 0830  ceFEPIme (MAXIPIME) 1 g in sodium chloride 0.9 % 100 mL IVPB  Status:  Discontinued        1 g 200 mL/hr over 30 Minutes Intravenous Every 24 hours 08/07/21 0724 08/07/21 1212   08/06/21 1830  vancomycin (VANCOREADY) IVPB 1750  mg/350 mL        1,750 mg 175 mL/hr over 120 Minutes Intravenous  Once 08/06/21 1727 08/06/21 2054   08/06/21 1734  vancomycin variable dose per unstable renal function (pharmacist dosing)  Status:  Discontinued         Does not apply See admin instructions 08/06/21 1734 08/07/21 1212   08/06/21 1730  ceFEPIme (MAXIPIME) 2 g in sodium chloride 0.9 % 100 mL IVPB  Status:  Discontinued        2 g 200 mL/hr over 30 Minutes Intravenous  Once 08/06/21 1727 08/06/21 1734   08/06/21 1730  metroNIDAZOLE (FLAGYL) IVPB 500 mg        500 mg 100 mL/hr over 60 Minutes Intravenous  Once 08/06/21 1727 08/06/21 1927       Assessment/Plan: s/p Procedure(s): LAPAROSCOPIC CHOLECYSTECTOMY (N/A) Doing well tol PO Pain controlled OK for DC when OK with medicine team.   F/u inchart  LOS: 5 days    Ralene Ok 08/11/2021

## 2021-08-11 NOTE — Progress Notes (Addendum)
Physical Therapy Evaluation Patient Details Name: Erika Fry MRN: [id number] DOB: Aug 08, 1946 Today's Date: 08/11/2021    History of Present Illness 75 y.o. female presents to [facility] ED on 08/06/2021 with weakness and dizziness, recently running out of midodrine. Pt with syncopal episode the day prior to admission. Systolic BPs in 08Q in ED. CT abdomen demonstrates gallbladder stone with underlying sludge. Pt admitted for septic shock and acute cholecystitis. PMH includes ESRD on peritoneal dialysis, AV block with pacemaker, diabetes, GERD, hyperlipidemia, hypothyroidism, arthritis.    PT Comments    Pt presents to PT with deficits in strength, power, balance, activity tolerance, endurance, gait, functional mobility, and with orthostatic symptoms. Pt reports dizziness with changes in position, with soft BP throughout session. Pt currently is only able to tolerate very short bouts of mobility due to dizziness and remains at a high risk for falls due to dizziness and weakness. Pt declines the possibility of SNF placement. If pt is to discharge home PT recommends HHPT and assistance for all out of bed mobility. Pt will benefit from aggressive mobilization during this admission to reduce falls risk and aide in restoring independence.  Follow Up Recommendations  Home health PT;Supervision for mobility/OOB (pt declining possibility of SNF)     Equipment Recommendations  None recommended by PT (pt owns necessary DME)    Recommendations for Other Services       Precautions / Restrictions Precautions Precautions: Fall Precaution Comments: orthostatic symptoms Restrictions Weight Bearing Restrictions: No    Mobility  Bed Mobility Overal bed mobility: Needs Assistance Bed Mobility: Rolling;Sidelying to Sit Rolling: Supervision Sidelying to sit: Supervision;HOB elevated       General bed mobility comments: increased time and use of railing    Transfers Overall transfer level: Needs  assistance   Transfers: Sit to/from Stand Sit to Stand: Min guard;From elevated surface         General transfer comment: cues for anterior trunk lean. Pt needing elevated bed, able to stand from recliner later in session  Ambulation/Gait Ambulation/Gait assistance: Min guard Gait Distance (Feet): 5 Feet Assistive device: Rolling walker (2 wheeled) Gait Pattern/deviations: Step-to pattern Gait velocity: reduced Gait velocity interpretation: <1.8 ft/sec, indicate of risk for recurrent falls General Gait Details: pt with slowed step-to gait, short step length   Stairs             Wheelchair Mobility    Modified Rankin (Stroke Patients Only)       Balance Overall balance assessment: Needs assistance Sitting-balance support: No upper extremity supported;Feet supported Sitting balance-Leahy Scale: Good Sitting balance - Comments: dons shoes in sitting   Standing balance support: Bilateral upper extremity supported Standing balance-Leahy Scale: Poor Standing balance comment: reliant on UE support of walker                            Cognition Arousal/Alertness: Awake/alert Behavior During Therapy: WFL for tasks assessed/performed Overall Cognitive Status: Within Functional Limits for tasks assessed                                        Exercises General Exercises - Lower Extremity Ankle Circles/Pumps: AROM;Both;20 reps Quad Sets: AROM;Both;20 reps Gluteal Sets: AROM;Both;20 reps Heel Slides: AAROM;Both;15 reps (self-AAROM)    General Comments General comments (skin integrity, edema, etc.): pt reports dizziness in sitting and standing throughout session. BP sitting  at edge of bed 106/49, BP after transfer to chair 95/62. BP after LE exercise in chair 92/36. Pt reports hypotension at baseline. Symptoms limiting activity tolerance      Pertinent Vitals/Pain Pain Assessment: Faces Faces Pain Scale: Hurts even more Pain Location:  abdomen Pain Descriptors / Indicators: Grimacing Pain Intervention(s): Monitored during session    Home Living Family/patient expects to be discharged to:: Private residence Living Arrangements: Alone Available Help at Discharge: Family;Available PRN/intermittently Type of Home: House Home Access: Stairs to enter Entrance Stairs-Rails: Right Home Layout: Able to live on main level with bedroom/bathroom;Laundry or work area in [place]: Environmental consultant - 2 wheels;Cane - single point;Wheelchair - [employer];Shower seat;Grab bars - tub/shower      Prior Function Level of Independence: Independent with assistive device(s)      Comments: pt reports ambulating with a cane but recently regressing to RW prior to symptom onset\   PT Goals (current goals can now be found in the care plan section) Acute Rehab PT Goals Patient Stated Goal: to go home and return to independence PT Goal Formulation: With patient Time For Goal Achievement: 08/25/21 Potential to Achieve Goals: Fair    Frequency    Min 3X/week      PT Plan      Co-evaluation              AM-PAC PT "6 Clicks" Mobility   Outcome Measure  Help needed turning from your back to your side while in a flat bed without using bedrails?: A Little Help needed moving from lying on your back to sitting on the side of a flat bed without using bedrails?: A Little Help needed moving to and from a bed to a chair (including a wheelchair)?: A Little Help needed standing up from a chair using your arms (e.g., wheelchair or bedside chair)?: A Little Help needed to walk in hospital room?: A Lot Help needed climbing 3-5 steps with a railing? : A Lot 6 Click Score: 16    End of Session   Activity Tolerance: Treatment limited secondary to medical complications (Comment) (hypotension and dizziness) Patient left: in chair;with call bell/phone within reach Nurse Communication: Mobility status PT Visit Diagnosis: Other  abnormalities of gait and mobility (R26.89);Muscle weakness (generalized) (M62.81)     Time: 0488-8916 PT Time Calculation (min) (ACUTE ONLY): 33 min  Charges:  $1 Low Eval $Therapeutic Activity: 8-22 mins                     Zenaida Niece, PT, DPT Acute Rehabilitation Pager: [phone]    Zenaida Niece 08/11/2021, 12:18 PM

## 2021-08-11 NOTE — Progress Notes (Signed)
PROGRESS NOTE    Erika Fry  BFX:[id number] DOB: 02/04/1946 DOA: 08/06/2021 PCP: [name], MD   Brief Narrative: 75 year old with past medical history significant for ESRD on peritoneal dialysis, AV block with pacemaker, diabetes, GERD, hyperlipidemia, hypothyroidism, arthritis who presented with generalized weakness and dizziness.  Patient ran out of her midodrine for over a week secondary to shortage at the pharmacy.  She had a syncopal episode day prior to admission.  Presented to the ED because she remained dizzy, complaining of abdominal pain after eating and reduced appetite.  Patient was initially hypotensive with systolic blood pressure in the 60 improved with 3.5 Lof fluids , MAP remains less than 65 so she was a started on peripheral Levophed.  Lactic acid up to 3.0.  CT abdomen and pelvis showed gallbladder stone and layering sludge without evidence of cholecystitis with moderate to severe right side hydronephrosis without obstructing calculus.   Patient was admitted with septic shock secondary to cholecystitis.  HIDA scan positive for cholecystitis.  Plan for cholecystectomy 9/8.  Patient care transfer From CCM service to triad on 9/8.  Assessment & Plan:   Principal Problem:   Septic shock ([place]) Active Problems:   Chronic kidney disease (CKD), stage IV (severe) (HCC)   Hypothyroidism   Hypokalemia   Chronic heart failure with preserved ejection fraction (HCC)   Pacemaker Medtronic Azure XT DR MRI Dual Chamber Pacemaker 03/25/2018   Diabetes mellitus type 2 in obese ([place])   ESRD on peritoneal dialysis ([place])   Hypotension   Pain of upper abdomen  1-Septic shock, component of hypovolemic shock, intra-abdominal source of infection, cholecystitis: -Patient presented with hypotension which did not improve with IV fluids.  She was a started on Levophed. -Patient presented with leukocytosis, hypotension, lactic acidosis, source of infection cholecystitis. -She is  now transition to home dose midodrine.  White blood cell trending down. -Continue with IV Zosyn, day 2 post surgery. Follow sx recommendations.  -Cholecystectomy 9/8 -Cortisol at 10.  She was a started on IV steroids. Steroids discontinue 9/09 -BP stable.   2-Acute cholecystitis, cholelithiasis: -HIDA scan positive for cholecystitis.  Ultrasound positive for gallbladder sludge and possible small stone. -Patient presented with leukocytosis and postprandial abdominal pain.  Septic shock. -Underwent cholecystectomy 9/8 -Appreciate Surgery evaluation. -Norco and fentanyl PN for pain.  - Tolerating clear diet. Advanced diet as tolerated.   3-ESRD on peritoneal dialysis: Nephrology following. Will need to transition to hemodialysis for 2 weeks post surgery. Needs Clip process.  Nephrology recommend serial HD in the hospital.   4-Hypokalemia, hyponatremia: Correction with dialysis  5-Anion gap metabolic acidosis: Gap of 16.  Secondary to his ESRD and lactic acidosis Hypothyroidism: Continue with Synthroid  6-Chronic dyspnea, DLCO/VA 83% consistent with mild obstructive disease. Continue with PRN albuterol  7-Chronic leg pain: Continue with hydrocodone Hyperglycemia: Suspect secondary to stress dose steroid: Continue with a sliding scale. -Monitor off steroids.   The wound care documentation below: POA                  Pressure Injury 08/07/21 Buttocks Left Deep Tissue Pressure Injury - Purple or maroon localized area of discolored intact skin or blood-filled blister due to damage of underlying soft tissue from pressure and/or shear. (Active)  08/07/21 0100  Location: Buttocks  Location Orientation: Left  Staging: Deep Tissue Pressure Injury - Purple or maroon localized area of discolored intact skin or blood-filled blister due to damage of underlying soft tissue from pressure and/or shear.  Wound Description (  Comments):   Present on Admission: Yes    Interventions: Prostat,  Boost Breeze  Estimated body mass index is 32.25 kg/m as calculated from the following:   Height as of this encounter: 5\' 5"  (1.651 m).   Weight as of this encounter: 87.9 kg.   DVT prophylaxis: Heparin Code Status: Full code Family Communication: Care Discussed  With patient Disposition Plan:  Status is: Inpatient  Remains inpatient appropriate because:IV treatments appropriate due to intensity of illness or inability to take PO  Dispo: The patient is from: Home              Anticipated d/c is to:  To be determined              Patient currently is not medically stable to d/c.   Difficult to place patient No        Consultants:  General surgery CCM admitted patient  Procedures:  Cholecystectomy  9/8  Antimicrobials:    Subjective: She is complaining of heart burn. Ate tomatoes soup/   Objective: Vitals:   08/10/21 2146 08/11/21 0426 08/11/21 0929 08/11/21 1138  BP: (!) 114/53 107/86 (!) 91/44   Pulse: 61 72 74   Resp: 18 18 17    Temp: 98 F (36.7 C) 98.4 F (36.9 C) 98.4 F (36.9 C)   TempSrc: Oral Oral Oral   SpO2: 97% 98% 99%   Weight:    87.9 kg  Height:        Intake/Output Summary (Last 24 hours) at 08/11/2021 1412 Last data filed at 08/11/2021 1300 Gross per 24 hour  Intake 960 ml  Output 0 ml  Net 960 ml    Filed Weights   08/10/21 1349 08/10/21 1730 08/11/21 1138  Weight: 87.6 kg 87.6 kg 87.9 kg    Examination:  General exam: NAD Respiratory system: CTA Cardiovascular system: S 1, S 2 RRR Gastrointestinal system: BS present, soft, two incision healing.  peritoneal catheter in place.  Central nervous system: alert, oriented, following command Extremities: Symmetric power   Data Reviewed: I have personally reviewed following labs and imaging studies  CBC: Recent Labs  Lab 08/06/21 1925 08/07/21 0626 08/08/21 0505 08/09/21 0322 08/10/21 0401 08/11/21 0830  WBC 14.1* 17.8* 13.1* 12.0* 14.8* 10.0  NEUTROABS 10.1*  --   --    --   --   --   HGB 9.9* 9.6* 8.3* 8.4* 8.4* 8.2*  HCT 29.2* 29.0* 24.8* 24.7* 24.4* 24.2*  MCV 96.7 96.3 95.4 93.9 94.9 96.4  PLT 131* 160 138* 159 144* 144*    Basic Metabolic Panel: Recent Labs  Lab 08/07/21 0010 08/07/21 0626 08/08/21 0505 08/09/21 0322 08/10/21 0401 08/11/21 0416  NA  --  131* 128* 129* 130* 133*  K  --  3.2* 3.2* 3.2* 3.6 3.0*  CL  --  99 95* 96* 96* 94*  CO2  --  18* 17* 17* 17* 23  GLUCOSE  --  197* 233* 246* 105* 80  BUN  --  38* 44* 41* 45* 23  CREATININE 6.70* 7.57* 7.87* 7.14* 8.14* 4.66*  CALCIUM  --  9.1 9.4 9.5 9.2 9.1  MG 1.2* 1.5* 2.2 1.9  --   --   PHOS  --  5.6* 5.3* 5.3*  --  4.2    GFR: Estimated Creatinine Clearance: 11.1 mL/min (A) (by C-G formula based on SCr of 4.66 mg/dL (H)). Liver Function Tests: Recent Labs  Lab 08/06/21 1725  AST 19  ALT 13  ALKPHOS 126  BILITOT 0.7  PROT 6.1*  ALBUMIN 2.4*    Recent Labs  Lab 08/06/21 1725  LIPASE 28    No results for input(s): AMMONIA in the last 168 hours. Coagulation Profile: Recent Labs  Lab 08/06/21 1725  INR 1.0    Cardiac Enzymes: No results for input(s): CKTOTAL, CKMB, CKMBINDEX, TROPONINI in the last 168 hours. BNP (last 3 results) No results for input(s): PROBNP in the last 8760 hours. HbA1C: No results for input(s): HGBA1C in the last 72 hours.  CBG: Recent Labs  Lab 08/10/21 1123 08/10/21 1838 08/11/21 0007 08/11/21 0732 08/11/21 1151  GLUCAP 109* 86 85 90 104*    Lipid Profile: No results for input(s): CHOL, HDL, LDLCALC, TRIG, CHOLHDL, LDLDIRECT in the last 72 hours. Thyroid Function Tests: No results for input(s): TSH, T4TOTAL, FREET4, T3FREE, THYROIDAB in the last 72 hours.  Anemia Panel: No results for input(s): VITAMINB12, FOLATE, FERRITIN, TIBC, IRON, RETICCTPCT in the last 72 hours. Sepsis Labs: Recent Labs  Lab 08/06/21 1725 08/06/21 1925  LATICACIDVEN 3.0* 2.1*     Recent Results (from the past 240 hour(s))  Resp Panel by  RT-PCR (Flu A&B, Covid) Nasopharyngeal Swab     Status: None   Collection Time: 08/06/21  5:25 PM   Specimen: Nasopharyngeal Swab; Nasopharyngeal(NP) swabs in vial transport medium  Result Value Ref Range Status   SARS Coronavirus 2 by RT PCR NEGATIVE NEGATIVE Final    Comment: (NOTE) SARS-CoV-2 target nucleic acids are NOT DETECTED.  The SARS-CoV-2 RNA is generally detectable in upper respiratory specimens during the acute phase of infection. The lowest concentration of SARS-CoV-2 viral copies this assay can detect is 138 copies/mL. A negative result does not preclude SARS-Cov-2 infection and should not be used as the sole basis for treatment or other patient management decisions. A negative result may occur with  improper specimen collection/handling, submission of specimen other than nasopharyngeal swab, presence of viral mutation(s) within the areas targeted by this assay, and inadequate number of viral copies(<138 copies/mL). A negative result must be combined with clinical observations, patient history, and epidemiological information. The expected result is Negative.  Fact Sheet for Patients:  EntrepreneurPulse.com.au  Fact Sheet for Healthcare Providers:  IncredibleEmployment.be  This test is no t yet approved or cleared by the [place] FDA and  has been authorized for detection and/or diagnosis of SARS-CoV-2 by FDA under an Emergency Use Authorization (EUA). This EUA will remain  in effect (meaning this test can be used) for the duration of the COVID-19 declaration under Section 564(b)(1) of the Act, 21 U.S.C.section 360bbb-3(b)(1), unless the authorization is terminated  or revoked sooner.       Influenza A by PCR NEGATIVE NEGATIVE Final   Influenza B by PCR NEGATIVE NEGATIVE Final    Comment: (NOTE) The Xpert Xpress SARS-CoV-2/FLU/RSV plus assay is intended as an aid in the diagnosis of influenza from Nasopharyngeal swab  specimens and should not be used as a sole basis for treatment. Nasal washings and aspirates are unacceptable for Xpert Xpress SARS-CoV-2/FLU/RSV testing.  Fact Sheet for Patients: EntrepreneurPulse.com.au  Fact Sheet for Healthcare Providers: IncredibleEmployment.be  This test is not yet approved or cleared by the [place] FDA and has been authorized for detection and/or diagnosis of SARS-CoV-2 by FDA under an Emergency Use Authorization (EUA). This EUA will remain in effect (meaning this test can be used) for the duration of the COVID-19 declaration under Section 564(b)(1) of the Act, 21 U.S.C. section 360bbb-3(b)(1), unless the authorization is terminated  or revoked.  Performed at [facility], [place] [address] [place], [place] [address]   Blood Culture (routine x 2)     Status: None (Preliminary result)   Collection Time: 08/06/21  5:25 PM   Specimen: BLOOD RIGHT FOREARM  Result Value Ref Range Status   Specimen Description BLOOD RIGHT FOREARM  Final   Special Requests   Final    BOTTLES DRAWN AEROBIC AND ANAEROBIC Blood Culture results may not be optimal due to an inadequate volume of blood received in culture bottles   Culture   Final    NO GROWTH 4 DAYS Performed at [facility], [place] [address] [place], [place] [address]    Report Status PENDING  Incomplete  Blood Culture (routine x 2)     Status: None (Preliminary result)   Collection Time: 08/06/21  5:30 PM   Specimen: BLOOD RIGHT FOREARM  Result Value Ref Range Status   Specimen Description BLOOD RIGHT FOREARM  Final   Special Requests   Final    BOTTLES DRAWN AEROBIC AND ANAEROBIC Blood Culture results may not be optimal due to an inadequate volume of blood received in culture bottles   Culture   Final    NO GROWTH 4 DAYS Performed at [facility], [place] [address] [place], [place] [address]    Report Status PENDING  Incomplete  MRSA Next Gen by PCR,  Nasal     Status: None   Collection Time: 08/07/21 12:54 AM   Specimen: Nasal Mucosa; Nasal Swab  Result Value Ref Range Status   MRSA by PCR Next Gen NOT DETECTED NOT DETECTED Final    Comment: (NOTE) The GeneXpert MRSA Assay (FDA approved for NASAL specimens only), is one component of a comprehensive MRSA colonization surveillance program. It is not intended to diagnose MRSA infection nor to guide or monitor treatment for MRSA infections. Test performance is not FDA approved in patients less than 61 years old. Performed at [facility], [place] [address], [place], [place] [address]           Radiology Studies: No results found.      Scheduled Meds:  (feeding supplement) PROSource Plus  30 mL Oral BID BM   acetaminophen  650 mg Oral Q6H   vitamin C  500 mg Oral BID   Chlorhexidine Gluconate Cloth  6 each Topical Q0600   Chlorhexidine Gluconate Cloth  6 each Topical Q0600   escitalopram  5 mg Oral Daily   feeding supplement  1 Container Oral TID BM   ferric citrate  420 mg Oral TID WC   gentamicin cream  1 application Topical Daily   heparin  5,000 Units Subcutaneous Q8H   insulin aspart  0-6 Units Subcutaneous TID WC   levothyroxine  75 mcg Oral Q0600   midodrine  10 mg Oral TID WC   pantoprazole  40 mg Oral BID   senna  1 tablet Oral QHS   simvastatin  40 mg Oral q1800   vitamin A  10,000 Units Oral Daily   zinc sulfate  220 mg Oral BID   Continuous Infusions:  sodium chloride 10 mL/hr at 08/10/21 0700   piperacillin-tazobactam 2.25 g (08/11/21 0737)     LOS: 5 days    Time spent: 35 minutes.     Elmarie Shiley, MD [facility]   If 7PM-7AM, please contact night-coverage [email or web address]  08/11/2021, 2:12 PM

## 2021-08-12 ENCOUNTER — Inpatient Hospital Stay (HOSPITAL_COMMUNITY): Payer: Medicare Other

## 2021-08-12 LAB — CBC
HCT: 24.4 % — ABNORMAL LOW (ref 36.0–46.0)
Hemoglobin: 8 g/dL — ABNORMAL LOW (ref 12.0–15.0)
MCH: 31.7 pg (ref 26.0–34.0)
MCHC: 32.8 g/dL (ref 30.0–36.0)
MCV: 96.8 fL (ref 80.0–100.0)
Platelets: 152 10*3/uL (ref 150–400)
RBC: 2.52 MIL/uL — ABNORMAL LOW (ref 3.87–5.11)
RDW: 13.2 % (ref 11.5–15.5)
WBC: 10.9 10*3/uL — ABNORMAL HIGH (ref 4.0–10.5)
nRBC: 0 % (ref 0.0–0.2)

## 2021-08-12 LAB — GLUCOSE, CAPILLARY
Glucose-Capillary: 82 mg/dL (ref 70–99)
Glucose-Capillary: 98 mg/dL (ref 70–99)
Glucose-Capillary: 92 mg/dL (ref 70–99)
Glucose-Capillary: 130 mg/dL — ABNORMAL HIGH (ref 70–99)

## 2021-08-12 LAB — BASIC METABOLIC PANEL
Anion gap: 16 — ABNORMAL HIGH (ref 5–15)
BUN: 34 mg/dL — ABNORMAL HIGH (ref 8–23)
CO2: 20 mmol/L — ABNORMAL LOW (ref 22–32)
Calcium: 8.9 mg/dL (ref 8.9–10.3)
Chloride: 97 mmol/L — ABNORMAL LOW (ref 98–111)
Creatinine, Ser: 6.2 mg/dL — ABNORMAL HIGH (ref 0.44–1.00)
GFR, Estimated: 6 mL/min — ABNORMAL LOW (ref >60)
Glucose, Bld: 94 mg/dL (ref 70–99)
Potassium: 3.4 mmol/L — ABNORMAL LOW (ref 3.5–5.1)
Sodium: 133 mmol/L — ABNORMAL LOW (ref 135–145)

## 2021-08-12 LAB — LACTIC ACID, PLASMA
Lactic Acid, Venous: 1.2 mmol/L (ref 0.5–1.9)
Lactic Acid, Venous: 1.6 mmol/L (ref 0.5–1.9)

## 2021-08-12 MED ORDER — LACTATED RINGERS IV BOLUS
500.0000 mL | Freq: Once | INTRAVENOUS | Status: AC
  Administered 2021-08-12: 500 mL via INTRAVENOUS

## 2021-08-12 MED ORDER — PREDNISONE 20 MG PO TABS: 20.0000 mg | ORAL_TABLET | Freq: Every day | ORAL | Status: DC

## 2021-08-12 MED ORDER — SODIUM CHLORIDE 0.9 % IV BOLUS: 250.0000 mL | Freq: Once | INTRAVENOUS | Status: DC

## 2021-08-12 MED ORDER — SACCHAROMYCES BOULARDII 250 MG PO CAPS
250.0000 mg | ORAL_CAPSULE | Freq: Two times a day (BID) | ORAL | Status: DC
  Administered 2021-08-12 – 2021-08-17 (×10): 250 mg via ORAL
  Filled 2021-08-12 (×10): qty 1

## 2021-08-12 MED ORDER — SODIUM CHLORIDE 0.9 % IV BOLUS
500.0000 mL | Freq: Once | INTRAVENOUS | Status: AC
  Administered 2021-08-12: 500 mL via INTRAVENOUS

## 2021-08-12 MED ORDER — FAMOTIDINE 20 MG PO TABS
20.0000 mg | ORAL_TABLET | Freq: Once | ORAL | Status: AC
  Administered 2021-08-12: 20 mg via ORAL
  Filled 2021-08-12: qty 1

## 2021-08-12 MED ORDER — METHYLPREDNISOLONE SODIUM SUCC 125 MG IJ SOLR: 50.0000 mg | Freq: Two times a day (BID) | INTRAMUSCULAR | Status: DC

## 2021-08-12 MED ORDER — METHYLPREDNISOLONE SODIUM SUCC 125 MG IJ SOLR
80.0000 mg | INTRAMUSCULAR | Status: DC
  Administered 2021-08-12: 80 mg via INTRAVENOUS
  Filled 2021-08-12: qty 2

## 2021-08-12 MED ORDER — POTASSIUM CHLORIDE CRYS ER 10 MEQ PO TBCR
10.0000 meq | EXTENDED_RELEASE_TABLET | Freq: Once | ORAL | Status: AC
  Administered 2021-08-12: 10 meq via ORAL
  Filled 2021-08-12: qty 1

## 2021-08-12 NOTE — Progress Notes (Addendum)
PROGRESS NOTE    Erika Fry  FUX:[id number] DOB: 11-23-1946 DOA: 08/06/2021 PCP: [name], MD   Brief Narrative: 75 year old with past medical history significant for ESRD on peritoneal dialysis, AV block with pacemaker, diabetes, GERD, hyperlipidemia, hypothyroidism, arthritis who presented with generalized weakness and dizziness.  Patient ran out of her midodrine for over a week secondary to shortage at the pharmacy.  She had a syncopal episode day prior to admission.  Presented to the ED because she remained dizzy, complaining of abdominal pain after eating and reduced appetite.  Patient was initially hypotensive with systolic blood pressure in the 60 improved with 3.5 Lof fluids , MAP remains less than 65 so she was a started on peripheral Levophed.  Lactic acid up to 3.0.  CT abdomen and pelvis showed gallbladder stone and layering sludge without evidence of cholecystitis with moderate to severe right side hydronephrosis without obstructing calculus.   Patient was admitted with septic shock secondary to cholecystitis.  HIDA scan positive for cholecystitis.  Plan for cholecystectomy 9/8.  Patient care transfer From CCM service to triad on 9/8.  Assessment & Plan:   Principal Problem:   Septic shock ([place]) Active Problems:   Chronic kidney disease (CKD), stage IV (severe) (HCC)   Hypothyroidism   Hypokalemia   Chronic heart failure with preserved ejection fraction (HCC)   Pacemaker Medtronic Azure XT DR MRI Dual Chamber Pacemaker 03/25/2018   Diabetes mellitus type 2 in obese ([place])   ESRD on peritoneal dialysis ([place])   Hypotension   Pain of upper abdomen  1-Septic shock, component of hypovolemic shock, intra-abdominal source of infection, cholecystitis: -Patient presented with hypotension which did not improve with IV fluids.  She was a started on Levophed. -Patient presented with leukocytosis, hypotension, lactic acidosis, source of infection cholecystitis. -She is  now transition to home dose midodrine.  White blood cell trending down. -Continue with IV Zosyn, day 3 post surgery. Follow sx recommendations. Continue with IV antibiotcs due to hypotension.  -Cholecystectomy 9/8. -Cortisol at 10.  She was a started on IV steroids. Steroids discontinue 9/09 Hypotension: Hb stable. Lactic acid normal. Some problems with accuracy checking BP, (BP check in her leg). She has received IV fluids and Albumin. I discussed with CCM, patient doesn't have sign of active infection, OK for BP in the  high 80 , MAP 55 if she has good mentation.  Midodrine was increase to 10 mg.  Plan to check ECHO.  Addendum: Discussed with Dr Nadyne Coombes, he agrees with ECHO, recommended to resume steroids.  -IV steroids low dose.  -Patient had two large mushy BM, not eating a lot. Will give iv bolus. I came a check on patient she was alert and conversant, report stomach upset not eating a lot.   2-Acute cholecystitis, cholelithiasis: -HIDA scan positive for cholecystitis.  Ultrasound positive for gallbladder sludge and possible small stone. -Patient presented with leukocytosis and postprandial abdominal pain.  Septic shock. -Underwent cholecystectomy 9/8 -Appreciate Surgery evaluation. -Oxycodone PRN, careful with Hypotension.  - Diet advanced to renal diet.   3-ESRD on peritoneal dialysis: Nephrology following. Will need to transition to hemodialysis for 2 weeks post surgery. Needs Clip process.  Nephrology recommend serial HD in the hospital.   4-Hypokalemia, hyponatremia: Correction with dialysis  5-Anion gap metabolic acidosis: Gap of 16.  Secondary to his ESRD and lactic acidosis Hypothyroidism: Continue with Synthroid  6-Chronic dyspnea, DLCO/VA 83% consistent with mild obstructive disease. Continue with PRN albuterol  7-Chronic leg pain: Continue with hydrocodone  Hyperglycemia: Suspect secondary to stress dose steroid: Continue with a sliding scale. -Monitor off steroids.   CBG normalized.   The wound care documentation below: POA                  Pressure Injury 08/07/21 Buttocks Left Deep Tissue Pressure Injury - Purple or maroon localized area of discolored intact skin or blood-filled blister due to damage of underlying soft tissue from pressure and/or shear. (Active)  08/07/21 0100  Location: Buttocks  Location Orientation: Left  Staging: Deep Tissue Pressure Injury - Purple or maroon localized area of discolored intact skin or blood-filled blister due to damage of underlying soft tissue from pressure and/or shear.  Wound Description (Comments):   Present on Admission: Yes    Interventions: Prostat, Boost Breeze  Estimated body mass index is 33.27 kg/m as calculated from the following:   Height as of this encounter: 5\' 5"  (1.651 m).   Weight as of this encounter: 90.7 kg.   DVT prophylaxis: Heparin Code Status: Full code Family Communication: Care Discussed  With patient. Patient pleasantly decline that I update her family .  Addendum: Updated Daughter in law of plan of care 9/11. Disposition Plan:  Status is: Inpatient  Remains inpatient appropriate because:IV treatments appropriate due to intensity of illness or inability to take PO  Dispo: The patient is from: Home              Anticipated d/c is to:  To be determined              Patient currently is not medically stable to d/c.   Difficult to place patient No        Consultants:  General surgery CCM admitted patient  Procedures:  Cholecystectomy  9/8  Antimicrobials:    Subjective: She is alert and conversant, report mild dizziness when she moves head. She has that problem at home as well.   Objective: Vitals:   08/12/21 0200 08/12/21 0211 08/12/21 0500 08/12/21 0519  BP: (!) 92/27 (!) 94/35  (!) 85/32  Pulse: 74   95  Resp: 16   16  Temp: 97.7 F (36.5 C)   97.9 F (36.6 C)  TempSrc: Oral   Oral  SpO2: 99%   99%  Weight:   90.7 kg   Height:         Intake/Output Summary (Last 24 hours) at 08/12/2021 0759 Last data filed at 08/11/2021 1840 Gross per 24 hour  Intake 607.21 ml  Output --  Net 607.21 ml    Filed Weights   08/10/21 1730 08/11/21 1138 08/12/21 0500  Weight: 87.6 kg 87.9 kg 90.7 kg    Examination:  General exam: NAD Respiratory system: CTA Cardiovascular system: S 1, S 2 RRR Gastrointestinal system: BS, soft, nt, two incision healing.  peritoneal catheter in place.  Central nervous system: Alert, oriented , follows command  Extremities: trace edema   Data Reviewed: I have personally reviewed following labs and imaging studies  CBC: Recent Labs  Lab 08/06/21 1925 08/07/21 0626 08/08/21 0505 08/09/21 0322 08/10/21 0401 08/11/21 0830  WBC 14.1* 17.8* 13.1* 12.0* 14.8* 10.0  NEUTROABS 10.1*  --   --   --   --   --   HGB 9.9* 9.6* 8.3* 8.4* 8.4* 8.2*  HCT 29.2* 29.0* 24.8* 24.7* 24.4* 24.2*  MCV 96.7 96.3 95.4 93.9 94.9 96.4  PLT 131* 160 138* 159 144* 144*    Basic Metabolic Panel: Recent Labs  Lab 08/07/21 0010  08/07/21 7673 08/08/21 0505 08/09/21 0322 08/10/21 0401 08/11/21 0416  NA  --  131* 128* 129* 130* 133*  K  --  3.2* 3.2* 3.2* 3.6 3.0*  CL  --  99 95* 96* 96* 94*  CO2  --  18* 17* 17* 17* 23  GLUCOSE  --  197* 233* 246* 105* 80  BUN  --  38* 44* 41* 45* 23  CREATININE 6.70* 7.57* 7.87* 7.14* 8.14* 4.66*  CALCIUM  --  9.1 9.4 9.5 9.2 9.1  MG 1.2* 1.5* 2.2 1.9  --   --   PHOS  --  5.6* 5.3* 5.3*  --  4.2    GFR: Estimated Creatinine Clearance: 11.3 mL/min (A) (by C-G formula based on SCr of 4.66 mg/dL (H)). Liver Function Tests: Recent Labs  Lab 08/06/21 1725  AST 19  ALT 13  ALKPHOS 126  BILITOT 0.7  PROT 6.1*  ALBUMIN 2.4*    Recent Labs  Lab 08/06/21 1725  LIPASE 28    No results for input(s): AMMONIA in the last 168 hours. Coagulation Profile: Recent Labs  Lab 08/06/21 1725  INR 1.0    Cardiac Enzymes: No results for input(s): CKTOTAL, CKMB,  CKMBINDEX, TROPONINI in the last 168 hours. BNP (last 3 results) No results for input(s): PROBNP in the last 8760 hours. HbA1C: No results for input(s): HGBA1C in the last 72 hours.  CBG: Recent Labs  Lab 08/11/21 0732 08/11/21 1151 08/11/21 1619 08/11/21 2136 08/12/21 0655  GLUCAP 90 104* 146* 129* 82    Lipid Profile: No results for input(s): CHOL, HDL, LDLCALC, TRIG, CHOLHDL, LDLDIRECT in the last 72 hours. Thyroid Function Tests: No results for input(s): TSH, T4TOTAL, FREET4, T3FREE, THYROIDAB in the last 72 hours.  Anemia Panel: No results for input(s): VITAMINB12, FOLATE, FERRITIN, TIBC, IRON, RETICCTPCT in the last 72 hours. Sepsis Labs: Recent Labs  Lab 08/06/21 1725 08/06/21 1925  LATICACIDVEN 3.0* 2.1*     Recent Results (from the past 240 hour(s))  Resp Panel by RT-PCR (Flu A&B, Covid) Nasopharyngeal Swab     Status: None   Collection Time: 08/06/21  5:25 PM   Specimen: Nasopharyngeal Swab; Nasopharyngeal(NP) swabs in vial transport medium  Result Value Ref Range Status   SARS Coronavirus 2 by RT PCR NEGATIVE NEGATIVE Final    Comment: (NOTE) SARS-CoV-2 target nucleic acids are NOT DETECTED.  The SARS-CoV-2 RNA is generally detectable in upper respiratory specimens during the acute phase of infection. The lowest concentration of SARS-CoV-2 viral copies this assay can detect is 138 copies/mL. A negative result does not preclude SARS-Cov-2 infection and should not be used as the sole basis for treatment or other patient management decisions. A negative result may occur with  improper specimen collection/handling, submission of specimen other than nasopharyngeal swab, presence of viral mutation(s) within the areas targeted by this assay, and inadequate number of viral copies(<138 copies/mL). A negative result must be combined with clinical observations, patient history, and epidemiological information. The expected result is Negative.  Fact Sheet for  Patients:  EntrepreneurPulse.com.au  Fact Sheet for Healthcare Providers:  IncredibleEmployment.be  This test is no t yet approved or cleared by the [place] FDA and  has been authorized for detection and/or diagnosis of SARS-CoV-2 by FDA under an Emergency Use Authorization (EUA). This EUA will remain  in effect (meaning this test can be used) for the duration of the COVID-19 declaration under Section 564(b)(1) of the Act, 21 U.S.C.section 360bbb-3(b)(1), unless the authorization is terminated  or revoked sooner.       Influenza A by PCR NEGATIVE NEGATIVE Final   Influenza B by PCR NEGATIVE NEGATIVE Final    Comment: (NOTE) The Xpert Xpress SARS-CoV-2/FLU/RSV plus assay is intended as an aid in the diagnosis of influenza from Nasopharyngeal swab specimens and should not be used as a sole basis for treatment. Nasal washings and aspirates are unacceptable for Xpert Xpress SARS-CoV-2/FLU/RSV testing.  Fact Sheet for Patients: EntrepreneurPulse.com.au  Fact Sheet for Healthcare Providers: IncredibleEmployment.be  This test is not yet approved or cleared by the [place] FDA and has been authorized for detection and/or diagnosis of SARS-CoV-2 by FDA under an Emergency Use Authorization (EUA). This EUA will remain in effect (meaning this test can be used) for the duration of the COVID-19 declaration under Section 564(b)(1) of the Act, 21 U.S.C. section 360bbb-3(b)(1), unless the authorization is terminated or revoked.  Performed at [facility], [place] [address] [place], [place] [address]   Blood Culture (routine x 2)     Status: None   Collection Time: 08/06/21  5:25 PM   Specimen: BLOOD RIGHT FOREARM  Result Value Ref Range Status   Specimen Description BLOOD RIGHT FOREARM  Final   Special Requests   Final    BOTTLES DRAWN AEROBIC AND ANAEROBIC Blood Culture results may not be optimal due  to an inadequate volume of blood received in culture bottles   Culture   Final    NO GROWTH 5 DAYS Performed at [facility], [place] [address] [place], [place] [address]    Report Status 08/11/2021 FINAL  Final  Blood Culture (routine x 2)     Status: None   Collection Time: 08/06/21  5:30 PM   Specimen: BLOOD RIGHT FOREARM  Result Value Ref Range Status   Specimen Description BLOOD RIGHT FOREARM  Final   Special Requests   Final    BOTTLES DRAWN AEROBIC AND ANAEROBIC Blood Culture results may not be optimal due to an inadequate volume of blood received in culture bottles   Culture   Final    NO GROWTH 5 DAYS Performed at [facility], [place] [address], [place], [place] [address]    Report Status 08/11/2021 FINAL  Final  MRSA Next Gen by PCR, Nasal     Status: None   Collection Time: 08/07/21 12:54 AM   Specimen: Nasal Mucosa; Nasal Swab  Result Value Ref Range Status   MRSA by PCR Next Gen NOT DETECTED NOT DETECTED Final    Comment: (NOTE) The GeneXpert MRSA Assay (FDA approved for NASAL specimens only), is one component of a comprehensive MRSA colonization surveillance program. It is not intended to diagnose MRSA infection nor to guide or monitor treatment for MRSA infections. Test performance is not FDA approved in patients less than 64 years old. Performed at [facility], [place] [address] [place], [place] [address]           Radiology Studies: No results found.      Scheduled Meds:  (feeding supplement) PROSource Plus  30 mL Oral BID BM   acetaminophen  650 mg Oral Q6H   vitamin C  500 mg Oral BID   Chlorhexidine Gluconate Cloth  6 each Topical Q0600   Chlorhexidine Gluconate Cloth  6 each Topical Q0600   escitalopram  5 mg Oral Daily   feeding supplement  1 Container Oral TID BM   ferric citrate  420 mg Oral TID WC  gentamicin cream  1 application Topical Daily   heparin  5,000 Units Subcutaneous Q8H   insulin aspart  0-6 Units  Subcutaneous TID WC   levothyroxine  75 mcg Oral Q0600   midodrine  10 mg Oral TID WC   pantoprazole  40 mg Oral BID   senna  1 tablet Oral QHS   simvastatin  40 mg Oral q1800   vitamin A  10,000 Units Oral Daily   zinc sulfate  220 mg Oral BID   Continuous Infusions:  sodium chloride 10 mL/hr at 08/10/21 0700   piperacillin-tazobactam 2.25 g (08/12/21 0517)     LOS: 6 days    Time spent: 35 minutes.     Elmarie Shiley, MD [facility]   If 7PM-7AM, please contact night-coverage [email or web address]  08/12/2021, 7:59 AM

## 2021-08-12 NOTE — Progress Notes (Addendum)
Report called to Devan, RN on 5W.  Patient transferred to 5W20 at 1820. Verdis Frederickson, RN at bedside to receive patient. Family aware of transfer.

## 2021-08-12 NOTE — Progress Notes (Signed)
Pt arrived on the unit, Alert/Oriented x4, RA, BP in 70s. Bilateral edema LE. Pacemaker is on. Pt does not look in distress, denies pain. Belongings at the bedside.  Bed in low position, alarms are on, call bell in reach

## 2021-08-12 NOTE — Progress Notes (Signed)
Physical Therapy Treatment Patient Details Name: ROMA BONDAR MRN: [id number] DOB: [birth year] Today's Date: 08/12/2021    History of Present Illness 75 y.o. female presents to [facility] on 08/06/2021 with weakness and dizziness, recently running out of midodrine. Pt with syncopal episode the day prior to admission. Systolic BPs in 30Z in ED. CT abdomen demonstrates gallbladder stone with underlying sludge. Pt admitted for septic shock and acute cholecystitis. PMH includes ESRD on peritoneal dialysis, AV block with pacemaker, diabetes, GERD, hyperlipidemia, hypothyroidism, arthritis.    PT Comments    Pt remains limited in activity tolerance by dizziness with changes in position. Pt BP during session do not demonstrate orthostatic BP, however BP taken from calf due to IV and HDU cath placement in upper extremities. Pt continues to demonstrate weakness and benefits from assistance to reduce falls risk and maintain safety. Pt will benefit from continued acute PT POC to improve mobility quality and restore independence. PT updates recommendations to SNF due to continued falls risk 2/2 dizziness and limited caregiver support.  Follow Up Recommendations  SNF (If pt is declines placement then PT recommends HHPT and assistance from family for all out of bed activity.)     Equipment Recommendations  None recommended by PT    Recommendations for Other Services       Precautions / Restrictions Precautions Precautions: Fall Precaution Comments: orthostatic symptoms Restrictions Weight Bearing Restrictions: No    Mobility  Bed Mobility Overal bed mobility: Needs Assistance Bed Mobility: Rolling;Sidelying to Sit Rolling: Supervision Sidelying to sit: Supervision;HOB elevated       General bed mobility comments: increased time and use of railing    Transfers Overall transfer level: Needs assistance Equipment used: Rolling walker (2 wheeled) Transfers: Sit to/from Stand Sit to Stand: Min  guard;From elevated surface         General transfer comment: cues for foot placement, anterior trunk lean and rocking for momentum from lower surfaces  Ambulation/Gait Ambulation/Gait assistance: Min guard Gait Distance (Feet): 20 Feet Assistive device: Rolling walker (2 wheeled) Gait Pattern/deviations: Step-to pattern;Trunk flexed Gait velocity: reduced Gait velocity interpretation: <1.8 ft/sec, indicate of risk for recurrent falls General Gait Details: pt with slowed step-to gait reduced gait speed   Stairs             Wheelchair Mobility    Modified Rankin (Stroke Patients Only)       Balance Overall balance assessment: Needs assistance Sitting-balance support: No upper extremity supported;Feet supported Sitting balance-Leahy Scale: Fair     Standing balance support: Bilateral upper extremity supported Standing balance-Leahy Scale: Poor Standing balance comment: reliant on UE support of walker                            Cognition Arousal/Alertness: Awake/alert Behavior During Therapy: WFL for tasks assessed/performed Overall Cognitive Status: Within Functional Limits for tasks assessed                                        Exercises      General Comments General comments (skin integrity, edema, etc.): BP supine 96/35 (52), sitting 109/64 (79) pt with reports of dizziness, standing 129/90 (101) pt with reports of worsened dizziness, after ambulation 160/39 (67), pt with continued reports of significant dizziness. all BPs taken from R calf due to IV and HDU site in UEs. PT assesses  pursuits and saccades without nystagmus or reports of dizziness, pt with slowed VOR however no nystagmus noted or dizziness reported      Pertinent Vitals/Pain Pain Assessment: Faces Faces Pain Scale: Hurts little more Pain Location: abdomen Pain Descriptors / Indicators: Grimacing Pain Intervention(s): Monitored during session    Home Living                       Prior Function            PT Goals (current goals can now be found in the care plan section) Acute Rehab PT Goals Patient Stated Goal: to return to independent mobility Progress towards PT goals: Progressing toward goals (very slowly due to dizziness)    Frequency    Min 3X/week      PT Plan Current plan remains appropriate    Co-evaluation              AM-PAC PT "6 Clicks" Mobility   Outcome Measure  Help needed turning from your back to your side while in a flat bed without using bedrails?: A Little Help needed moving from lying on your back to sitting on the side of a flat bed without using bedrails?: A Little Help needed moving to and from a bed to a chair (including a wheelchair)?: A Little Help needed standing up from a chair using your arms (e.g., wheelchair or bedside chair)?: A Little Help needed to walk in hospital room?: A Little Help needed climbing 3-5 steps with a railing? : A Lot 6 Click Score: 17    End of Session   Activity Tolerance: Treatment limited secondary to medical complications (Comment) (dizziness) Patient left: in chair;with call bell/phone within reach;with chair alarm set Nurse Communication: Mobility status PT Visit Diagnosis: Other abnormalities of gait and mobility (R26.89);Muscle weakness (generalized) (M62.81)     Time: 0086-7619 PT Time Calculation (min) (ACUTE ONLY): 25 min  Charges:  $Gait Training: 8-22 mins $Therapeutic Activity: 8-22 mins                     Zenaida Niece, PT, DPT Acute Rehabilitation Pager: [phone]    Zenaida Niece 08/12/2021, 12:38 PM

## 2021-08-12 NOTE — Progress Notes (Signed)
Upon completing my chart review, I realized this patient is listed as progressive but she is here on a med-surg unit. I spoke with Dr. Tyrell Antonio and she confirmed that the patient needs to be on a progressive unit. I spoke with bed placement and we will transfer her to a progressive unit.

## 2021-08-12 NOTE — Progress Notes (Signed)
[facility] Progress Note  Subjective: seen in room, no c/o's. Having difficutly walking w/ assistance, legs are "heavy".   Vitals:   08/12/21 0519 08/12/21 0811 08/12/21 0919 08/12/21 1514  BP: (!) 85/32 (!) 92/48 (!) 93/38 (!) 90/37  Pulse: 95  69   Resp: 16  17   Temp: 97.9 F (36.6 C)  98.2 F (36.8 C)   TempSrc: Oral  Oral   SpO2: 99%  100%   Weight:      Height:        Exam: General: WDWN; NAD; on RA Lungs: CTA bilaterally. No wheeze, rales or rhonchi. Breathing is unlabored. Heart: RRR. No murmur, rubs or gallops.  Abdomen: soft, nontender, active bowel sounds, RLQ PD cath intact Lower extremities: marked edema bilat UE's, mild edema LE's Neuro: AAOx3. Moves all extremities spontaneously. Dialysis Access: RUQ PD catheter, RUA AVF+bruit   Dialysis: [facility] 7x/week, [facility]   6 exchanges, dwell 1.5hrs, fill vol 2800cc, 80kg dry wt    Assessment/Plan: Cholecysitis - +HIDA scan showed non-visualized GB. US showed sludge and stones. SP lap chole on 9/08, also getting IV abx Syncopal episode/Dizziness/Hypotension- sp 3 L bolus in ED, getting midodrine here and BP's better. Off pressors. Wt's off today. No vol excess on exam.  ESRD - on CPPD at home. Will need temporary HD while waiting for surgical wounds to heal, 1-2 wks.  Has R arm AVF. Had HD Friday and AVF worked well, next HD Monday BP/ volume - chronic hypotension, on midodrine 5 bid at home. Will ^ to 10 tid here. Vol overload is significant, stood to stand 9/10 up and was 11kg up.  On room air, no resp issues. BP's soft but will need to get volume down. May need serial HD.  Anemia of CKD - Hgb 9.6-monitor trends, may need ESA if indicated. Secondary Hyperparathyroidism - Ca and PO4 okay-monitor trends. Nutrition -  advance to renal diet with fluid restriction when clinically stable       Rob Donivin Wirt 08/12/2021, 3:25 PM   Recent Labs  Lab 08/09/21 0322 08/10/21 0401 08/11/21 0416 08/11/21 0830  08/12/21 1018  K 3.2*   < > 3.0*  --  3.4*  BUN 41*   < > 23  --  34*  CREATININE 7.14*   < > 4.66*  --  6.20*  CALCIUM 9.5   < > 9.1  --  8.9  PHOS 5.3*  --  4.2  --   --   HGB 8.4*   < >  --  8.2* 8.0*   < > = values in this interval not displayed.    Inpatient medications:  (feeding supplement) PROSource Plus  30 mL Oral BID BM   acetaminophen  650 mg Oral Q6H   vitamin C  500 mg Oral BID   Chlorhexidine Gluconate Cloth  6 each Topical Q0600   Chlorhexidine Gluconate Cloth  6 each Topical Q0600   escitalopram  5 mg Oral Daily   feeding supplement  1 Container Oral TID BM   ferric citrate  420 mg Oral TID WC   gentamicin cream  1 application Topical Daily   heparin  5,000 Units Subcutaneous Q8H   insulin aspart  0-6 Units Subcutaneous TID WC   levothyroxine  75 mcg Oral Q0600   midodrine  10 mg Oral TID WC   pantoprazole  40 mg Oral BID   senna  1 tablet Oral QHS   simvastatin  40 mg Oral q1800  vitamin A  10,000 Units Oral Daily   zinc sulfate  220 mg Oral BID    sodium chloride 10 mL/hr at 08/10/21 0700   piperacillin-tazobactam 2.25 g (08/12/21 1350)   calcium carbonate, diclofenac Sodium, docusate sodium, dianeal solution for CAPD/CCPD with heparin, ondansetron (ZOFRAN) IV, oxyCODONE, polyethylene glycol

## 2021-08-13 ENCOUNTER — Inpatient Hospital Stay (HOSPITAL_COMMUNITY): Payer: Medicare Other

## 2021-08-13 LAB — ECHOCARDIOGRAM COMPLETE
AR max vel: 1.52 cm2
AV Area VTI: 1.18 cm2
AV Area mean vel: 1.46 cm2
AV Mean grad: 33 mmHg
AV Peak grad: 57.8 mmHg
Ao pk vel: 3.8 m/s
Area-P 1/2: 2.5 cm2
Height: 65 in
S' Lateral: 2.6 cm
Weight: 3209.9 oz

## 2021-08-13 LAB — HEPATIC FUNCTION PANEL
ALT: 13 U/L (ref 0–44)
AST: 12 U/L — ABNORMAL LOW (ref 15–41)
Albumin: 2.1 g/dL — ABNORMAL LOW (ref 3.5–5.0)
Alkaline Phosphatase: 81 U/L (ref 38–126)
Bilirubin, Direct: <0.1 mg/dL (ref 0.0–0.2)
Total Bilirubin: 0.8 mg/dL (ref 0.3–1.2)
Total Protein: 4.7 g/dL — ABNORMAL LOW (ref 6.5–8.1)

## 2021-08-13 LAB — GLUCOSE, CAPILLARY
Glucose-Capillary: 161 mg/dL — ABNORMAL HIGH (ref 70–99)
Glucose-Capillary: 119 mg/dL — ABNORMAL HIGH (ref 70–99)
Glucose-Capillary: 115 mg/dL — ABNORMAL HIGH (ref 70–99)
Glucose-Capillary: 111 mg/dL — ABNORMAL HIGH (ref 70–99)
Glucose-Capillary: 130 mg/dL — ABNORMAL HIGH (ref 70–99)

## 2021-08-13 LAB — CORTISOL-AM, BLOOD: Cortisol - AM: 12.4 ug/dL (ref 6.7–22.6)

## 2021-08-13 MED ORDER — DARBEPOETIN ALFA 60 MCG/0.3ML IJ SOSY
60.0000 ug | PREFILLED_SYRINGE | INTRAMUSCULAR | Status: DC
  Administered 2021-08-13: 60 ug via INTRAVENOUS
  Filled 2021-08-13: qty 0.3

## 2021-08-13 MED ORDER — PSEUDOEPHEDRINE HCL 30 MG PO TABS
30.0000 mg | ORAL_TABLET | Freq: Three times a day (TID) | ORAL | Status: DC
  Administered 2021-08-13 – 2021-08-14 (×4): 30 mg via ORAL
  Filled 2021-08-13 (×7): qty 1

## 2021-08-13 MED ORDER — HYDROCORTISONE SOD SUC (PF) 100 MG IJ SOLR: 50.0000 mg | Freq: Two times a day (BID) | INTRAMUSCULAR | Status: DC

## 2021-08-13 MED ORDER — ENSURE ENLIVE PO LIQD
237.0000 mL | ORAL | Status: DC
  Administered 2021-08-14 – 2021-08-16 (×3): 237 mL via ORAL

## 2021-08-13 MED ORDER — PROCHLORPERAZINE EDISYLATE 10 MG/2ML IJ SOLN
10.0000 mg | Freq: Four times a day (QID) | INTRAMUSCULAR | Status: DC | PRN
  Administered 2021-08-14: 10 mg via INTRAVENOUS
  Filled 2021-08-13: qty 2

## 2021-08-13 MED ORDER — ALBUMIN HUMAN 25 % IV SOLN
INTRAVENOUS | Status: AC
  Filled 2021-08-13: qty 100

## 2021-08-13 MED ORDER — MIDODRINE HCL 5 MG PO TABS
15.0000 mg | ORAL_TABLET | Freq: Three times a day (TID) | ORAL | Status: DC
  Administered 2021-08-14 – 2021-08-17 (×11): 15 mg via ORAL
  Filled 2021-08-13 (×11): qty 3

## 2021-08-13 MED ORDER — HYDROCORTISONE SOD SUC (PF) 100 MG IJ SOLR
50.0000 mg | Freq: Two times a day (BID) | INTRAMUSCULAR | Status: DC
  Administered 2021-08-13: 50 mg via INTRAVENOUS
  Filled 2021-08-13: qty 2

## 2021-08-13 NOTE — Progress Notes (Addendum)
4 Days Post-Op  Subjective: CC: Asked to see back for abx duration recommendations. Had soft bp yesterday. Undergoing HD now. Last BP 93. Afebrile. No tachycardia.   Having some soreness of around her incisions. No PRN pain meds. Tolerating diet without n/v. Ate half of her tray this am. Loose bm yesterday. Working with therapies.   Objective: Vital signs in last 24 hours: Temp:  [97.8 F (36.6 C)-98.8 F (37.1 C)] 98.6 F (37 C) (09/12 0840) Pulse Rate:  [60-86] 60 (09/12 1200) Resp:  [8-22] 16 (09/12 1200) BP: (73-103)/(21-57) 93/55 (09/12 1200) SpO2:  [97 %-100 %] 100 % (09/12 0847) Weight:  [91 kg-92.4 kg] 91 kg (09/12 0840) Last BM Date: 08/13/21  Intake/Output from previous day: 09/11 0701 - 09/12 0700 In: 1027.2 [P.O.:240; I.V.:36.6; IV Piggyback:750.6] Out: -  Intake/Output this shift: No intake/output data recorded.  PE: Gen:  Alert, NAD, pleasant Pulm: Normal rate and effort Abd: Soft, ND, appropriately tender around umbilical incision, otherwise nt. +BS. Incisions c/d/I. PD catheter present.  Skin: no rashes noted, warm and dry  Lab Results:  Recent Labs    08/11/21 0830 08/12/21 1018  WBC 10.0 10.9*  HGB 8.2* 8.0*  HCT 24.2* 24.4*  PLT 144* 152   BMET Recent Labs    08/11/21 0416 08/12/21 1018  NA 133* 133*  K 3.0* 3.4*  CL 94* 97*  CO2 23 20*  GLUCOSE 80 94  BUN 23 34*  CREATININE 4.66* 6.20*  CALCIUM 9.1 8.9   PT/INR No results for input(s): LABPROT, INR in the last 72 hours. CMP     Component Value Date/Time   NA 133 (L) 08/12/2021 1018   NA 134 07/02/2018 1409   K 3.4 (L) 08/12/2021 1018   CL 97 (L) 08/12/2021 1018   CO2 20 (L) 08/12/2021 1018   GLUCOSE 94 08/12/2021 1018   BUN 34 (H) 08/12/2021 1018   BUN 48 (H) 07/02/2018 1409   CREATININE 6.20 (H) 08/12/2021 1018   CREATININE 3.24 (H) 12/21/2018 1029   CALCIUM 8.9 08/12/2021 1018   CALCIUM 9.6 10/31/2017 1050   PROT 4.7 (L) 08/13/2021 0059   ALBUMIN 2.1 (L) 08/13/2021  0059   AST 12 (L) 08/13/2021 0059   ALT 13 08/13/2021 0059   ALKPHOS 81 08/13/2021 0059   BILITOT 0.8 08/13/2021 0059   GFRNONAA 6 (L) 08/12/2021 1018   GFRAA 6 (L) 02/02/2020 2138   Lipase     Component Value Date/Time   LIPASE 28 08/06/2021 1725    Studies/Results: No results found.  Anti-infectives: Anti-infectives (From admission, onward)    Start     Dose/Rate Route Frequency Ordered Stop   08/07/21 1730  ceFEPIme (MAXIPIME) 1 g in sodium chloride 0.9 % 100 mL IVPB  Status:  Discontinued        1 g 200 mL/hr over 30 Minutes Intravenous Every 24 hours 08/06/21 1734 08/07/21 0724   08/07/21 1300  piperacillin-tazobactam (ZOSYN) IVPB 2.25 g        2.25 g 100 mL/hr over 30 Minutes Intravenous Every 8 hours 08/07/21 1212     08/07/21 0830  ceFEPIme (MAXIPIME) 1 g in sodium chloride 0.9 % 100 mL IVPB  Status:  Discontinued        1 g 200 mL/hr over 30 Minutes Intravenous Every 24 hours 08/07/21 0724 08/07/21 1212   08/06/21 1830  vancomycin (VANCOREADY) IVPB 1750 mg/350 mL        1,750 mg 175 mL/hr over 120 Minutes Intravenous  Once 08/06/21 1727 08/06/21 2054   08/06/21 1734  vancomycin variable dose per unstable renal function (pharmacist dosing)  Status:  Discontinued         Does not apply See admin instructions 08/06/21 1734 08/07/21 1212   08/06/21 1730  ceFEPIme (MAXIPIME) 2 g in sodium chloride 0.9 % 100 mL IVPB  Status:  Discontinued        2 g 200 mL/hr over 30 Minutes Intravenous  Once 08/06/21 1727 08/06/21 1734   08/06/21 1730  metroNIDAZOLE (FLAGYL) IVPB 500 mg        500 mg 100 mL/hr over 60 Minutes Intravenous  Once 08/06/21 1727 08/06/21 1927        Assessment/Plan POD 4 s/p Laparoscopic Cholecystectomy for Acute Cholecystitis - Dr. Bobbye Morton - 08/09/2021 - Patient is tolerating diet without n/v. No PRN pain meds. Appropriately tender on exam. LFT's without elevation today. WBC normalized post op (slightly elevated yesterday at 10.9, lactic wnl). Not checked  today. Discussed with my attending. Abx can be stopped from our standpoint. Follow up arranged. We will follow peripherally moving forward. PT is recommending HH. Defer final timing of d/c to primary team.  FEN - Renal VTE - SCDs, okay for chemical prophylaxis from a general surgery standpoint  ID - Zosyn 9/6 >> suspect this can be stopped as there is source control. Will discuss with MD  GERD ESRD on PD - now on HD Hypothyroidism Chronic dyspnea    LOS: 7 days    Jillyn Ledger , [facility] 08/13/2021, 12:37 PM Please see Amion for pager number during day hours 7:00am-4:30pm
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
## 2021-08-13 NOTE — Progress Notes (Signed)
Pt emesis 400 ml around 1720. Dr Tyrell Antonio aware. Surgery already gone for day, will attempt on-call.  Pt tolerated breakfast fine. Went to HD. Returned, took scheduled meds. Began eating lunch, grilled chicken and got nauseous. 4mg  IV Zofran given. Pt ate chicken noodle soup afterwards. Ended up vomiting 400cc. Said she felt better and would rest, no pain. MD aware.  Message left for on-call [facility] Surgery regarding above information.

## 2021-08-13 NOTE — TOC Progression Note (Signed)
Transition of Care [facility]) - Progression Note    Patient Details  Name: Erika Fry MRN: [id number] Date of Birth: July 01, 1946  Transition [facility]) CM/SW [place], [place] Phone Number: 08/13/2021, 3:08 PM  Clinical Narrative:    CSW spoke with patient regarding SNF recommendation. She reported that she would only be willing to go to [facility] or [facility]. CSW contacted both facilities to ask if they could accept a dialysis patient but both responded that they could not at this time. Patient aware and stated that she is not willing to consider any other facility as she has been to one before that was terrible and her son had to call EMS to transport her home. She stated that her son and his wife will provide support for her at home and she is in agreement with home health services. She has used [facility] in the past and requests them again. CSW sent referral to [place] for review. CSW asked patient who would transport her to dialysis and she stated she has a friend that will do it if her family cannot. CSW offered to complete an [facility] [place] application for her but patient pleasantly declined. Medical staff came in to see patient so TOC will assess DME needs at a later time.    Expected Discharge Plan: [place] Barriers to Discharge: Continued Medical Work up  Expected Discharge Plan and Services Expected Discharge Plan: [place] In-house Referral: Clinical Social Work Discharge Planning Services: CM Consult Post Acute Care Choice: [place] arrangements for the past 2 months: Single Family Home                                       Social Determinants of Health (SDOH) Interventions    Readmission Risk Interventions Readmission Risk Prevention Plan 08/13/2021 12/29/2019 06/15/2019  Transportation Screening Complete Complete Complete  Medication Review Press photographer)  Complete Complete Complete  PCP or Specialist appointment within 3-5 days of discharge Complete (No Data) Not Complete  PCP/Specialist Appt Not Complete comments - - not ready for dc  HRI or Home Care Consult Complete Complete Complete  SW Recovery Care/Counseling Consult Complete - Complete  Palliative Care Screening Not Applicable Not Applicable Not [place] Patient Refused Not Applicable Not Applicable  Some recent data might be hidden

## 2021-08-13 NOTE — Progress Notes (Addendum)
Pt will need to go to [place] at d/c for HD on Monday, Tuesday, Thursday, and Friday. Pt's appt time is 1:20. Spoke to pt's PD RN last week who spoke to pt's nephrologist regarding pt's needs at d/c. Clinic will need orders at d/c.   Melven Sartorius Renal Navigator [phone]   Addendum: If pt admits to snf at d/c, then pt cannot receive out-pt HD at [facility]. Pt will need out-pt HD arranged at another location. If pt returns to home, then pt can go to [facility]. Message left for SW to discuss d/c plan.   Addendum at 4:23 pm: Notified by CSW that pt will be returning home at d/c. Spoke to Dixie [place] at [facility] who states that MD agreeable to pt going to [place] if pt to return home at d/c. Spoke to [place] at [facility] to provide update. If pt stable for d/c in the am, pt can receive out-pt treatment at clinic if pt can arrive by 1:00.

## 2021-08-13 NOTE — Progress Notes (Signed)
[facility] Progress Note  Subjective: Seen and examined on dialysis.  Blood pressure 76/43 and RUE AVF in use.  Spoke with RN and gave order for albumin 25 gram IV once now.  Review of systems: Denies n/v Denies chest pain  Denies shortness of breath  Vitals:   08/12/21 1957 08/12/21 2340 08/13/21 0300 08/13/21 0752  BP: (!) 103/57 (!) 98/48 (!) 91/49 (!) 94/53  Pulse: 86 66 60   Resp: 16 19 16 18   Temp: 98.1 F (36.7 C) 98.8 F (37.1 C) 97.8 F (36.6 C) 98.3 F (36.8 C)  TempSrc: Oral Oral Axillary Oral  SpO2: 98% 100% 97%   Weight:   92.4 kg   Height:        Exam:  General adult female in bed in no acute distress HEENT normocephalic atraumatic extraocular movements intact sclera anicteric Neck supple trachea midline Lungs clear to auscultation bilaterally normal work of breathing at rest  Heart S1S2 no rub Abdomen soft some TTP nondistended Extremities trace edema lower extremities and edema in upper extremities 1+ Psych normal mood and affect Neuro - alert and oriented x 3 provides hx and follows commands  RUE AVF in use; PD catheter in place   Dialysis: CCPD 7x/week, [facility]   6 exchanges, dwell 1.5hrs, fill vol 2800cc, 80kg dry wt    Assessment/Plan: Cholecysitis - +HIDA scan showed non-visualized GB. US showed sludge and stones. SP lap chole on 9/08, IV abx per primary team  Syncopal episode/Dizziness/Hypotension- sp 3 L bolus in ED, getting midodrine here and BP's better. Off pressors.  ESRD - on CPPD at home. Will need temporary HD while waiting for surgical wounds to heal, 2 wks.  Has R arm AVF HD today per MWF schedule  Chronic hypotension, on midodrine 5 bid at home per charting which was increased to 10 mg tid here. Vol overload is significant, stood to weight on 9/10 up and was 11kg up.  On room air, no resp issues. BP limits UF. May need serial HD.  Anemia of CKD - start ESA 60 mcg on 9/12 Secondary Hyperparathyroidism - on auryxia. monitor  trends.      Recent Labs  Lab 08/09/21 0322 08/10/21 0401 08/11/21 0416 08/11/21 0830 08/12/21 1018  K 3.2*   < > 3.0*  --  3.4*  BUN 41*   < > 23  --  34*  CREATININE 7.14*   < > 4.66*  --  6.20*  CALCIUM 9.5   < > 9.1  --  8.9  PHOS 5.3*  --  4.2  --   --   HGB 8.4*   < >  --  8.2* 8.0*   < > = values in this interval not displayed.   Inpatient medications:  (feeding supplement) PROSource Plus  30 mL Oral BID BM   acetaminophen  650 mg Oral Q6H   vitamin C  500 mg Oral BID   Chlorhexidine Gluconate Cloth  6 each Topical Q0600   Chlorhexidine Gluconate Cloth  6 each Topical Q0600   escitalopram  5 mg Oral Daily   feeding supplement  1 Container Oral TID BM   ferric citrate  420 mg Oral TID WC   gentamicin cream  1 application Topical Daily   heparin  5,000 Units Subcutaneous Q8H   hydrocortisone sod succinate (SOLU-CORTEF) inj  50 mg Intravenous Q12H   insulin aspart  0-6 Units Subcutaneous TID WC   levothyroxine  75 mcg Oral Q0600   midodrine  10 mg Oral TID WC   pantoprazole  40 mg Oral BID   saccharomyces boulardii  250 mg Oral BID   senna  1 tablet Oral QHS   simvastatin  40 mg Oral q1800   vitamin A  10,000 Units Oral Daily   zinc sulfate  220 mg Oral BID    sodium chloride Stopped (08/10/21 1039)   piperacillin-tazobactam 2.25 g (08/12/21 2206)   calcium carbonate, diclofenac Sodium, docusate sodium, dianeal solution for CAPD/CCPD with heparin, ondansetron (ZOFRAN) IV, oxyCODONE, polyethylene glycol   Claudia Desanctis, MD 9:32 AM 08/13/2021

## 2021-08-13 NOTE — Progress Notes (Signed)
PROGRESS NOTE    Erika Fry  UUV:[id number] DOB: 10-12-1946 DOA: 08/06/2021 PCP: [name], MD   Brief Narrative: 75 year old with past medical history significant for ESRD on peritoneal dialysis, AV block with pacemaker, diabetes, GERD, hyperlipidemia, hypothyroidism, arthritis who presented with generalized weakness and dizziness.  Patient ran out of her midodrine for over a week secondary to shortage at the pharmacy.  She had a syncopal episode day prior to admission.  Presented to the ED because she remained dizzy, complaining of abdominal pain after eating and reduced appetite.  Patient was initially hypotensive with systolic blood pressure in the 60 improved with 3.5 Lof fluids , MAP remains less than 65 so she was a started on peripheral Levophed.  Lactic acid up to 3.0.  CT abdomen and pelvis showed gallbladder stone and layering sludge without evidence of cholecystitis with moderate to severe right side hydronephrosis without obstructing calculus.   Patient was admitted with septic shock secondary to cholecystitis.  HIDA scan positive for cholecystitis.  Plan for cholecystectomy 9/8.  Patient care transfer From CCM service to triad on 9/8.  Assessment & Plan:   Principal Problem:   Septic shock ([place]) Active Problems:   Chronic kidney disease (CKD), stage IV (severe) (HCC)   Hypothyroidism   Hypokalemia   Chronic heart failure with preserved ejection fraction (HCC)   Pacemaker Medtronic Azure XT DR MRI Dual Chamber Pacemaker 03/25/2018   Diabetes mellitus type 2 in obese ([place])   ESRD on peritoneal dialysis ([place])   Hypotension   Pain of upper abdomen  1-Septic shock, component of hypovolemic shock, intra-abdominal source of infection, cholecystitis: -Patient presented with hypotension which did not improve with IV fluids.  She was a started on Levophed. -Patient presented with leukocytosis, hypotension, lactic acidosis, source of infection cholecystitis. -She is  now transition to home dose midodrine.  White blood cell trending down. -Continue with IV Zosyn, day 4 post surgery. -S/P Cholecystectomy 9/8. -Cortisol at 10.  She was a started on IV steroids. Steroids discontinue 9/09. Back on hydrocortisone 9/11 due to low blood pressure.  -General Surgery will follow up on patient and will give recommendation in regards when to stop  antibiotics.   Hypotension/ acute on Chronic.  -She was on midodrine at home.  -Hb stable. Lactic acid normal. She has received IV fluids and Albumin.  -I discussed with CCM (9/11)  patient doesn't have sign of active infection, OK for BP in the  high 88 MAP 55 if she has good mentation.  -Midodrine was increase to 10 mg TID  -Plan to check ECHO.  -Discussed with Dr Nadyne Coombes, he agrees with ECHO, recommended to resume steroids. He will see patient in consultation.  -IV steroids, hydrocortisone, resume 9/11. -SBP overnight was stable: 88--123. Mostly  in the 90 overnight.  -had low BP during HD, improved with albumin.   2-Acute cholecystitis, cholelithiasis: -HIDA scan positive for cholecystitis.  Ultrasound positive for gallbladder sludge and possible small stone. -Patient presented with leukocytosis and postprandial abdominal pain.  Septic shock. -Underwent cholecystectomy 9/8 -Appreciate Surgery evaluation. They will follow up on patient today.  -Oxycodone PRN, careful with Hypotension.  - Diet advanced to renal diet.   3-ESRD on peritoneal dialysis: Nephrology following. Will need to transition to hemodialysis for 2 weeks post surgery. Needs Clip process.  Nephrology recommend serial HD in the hospital.   4-Hypokalemia, hyponatremia: Correction with dialysis.  5-Anion gap metabolic acidosis: Gap of 16.  Secondary to his ESRD and lactic acidosis Hypothyroidism:  Continue with Synthroid.   6-Chronic dyspnea, DLCO/VA 83% consistent with mild obstructive disease. Continue with PRN albuterol  7-Chronic leg pain:  Continue with hydrocodone Hyperglycemia: Suspect secondary to stress dose steroid: Continue with a sliding scale. -Monitor off steroids.  CBG normalized.  Hypoalbuminemia;  Continue with Breeze.  Nutritionist consult.   The wound care documentation below: POA                  Pressure Injury 08/07/21 Buttocks Left Deep Tissue Pressure Injury - Purple or maroon localized area of discolored intact skin or blood-filled blister due to damage of underlying soft tissue from pressure and/or shear. (Active)  08/07/21 0100  Location: Buttocks  Location Orientation: Left  Staging: Deep Tissue Pressure Injury - Purple or maroon localized area of discolored intact skin or blood-filled blister due to damage of underlying soft tissue from pressure and/or shear.  Wound Description (Comments):   Present on Admission: Yes    Interventions: Prostat, Boost Breeze  Estimated body mass index is 33.9 kg/m as calculated from the following:   Height as of this encounter: 5\' 5"  (1.651 m).   Weight as of this encounter: 92.4 kg.   DVT prophylaxis: Heparin Code Status: Full code Family Communication:  Disposition Plan:  Status is: Inpatient  Remains inpatient appropriate because:IV treatments appropriate due to intensity of illness or inability to take PO  Dispo: The patient is from: Home              Anticipated d/c is to:  SNF              Patient currently is not medically stable to d/c. Needs serial HD, awaiting stabilization of BP.    Difficult to place patient No        Consultants:  General surgery CCM admitted patient  Procedures:  Cholecystectomy  9/8  Antimicrobials:    Subjective: Patient seen in HD. She is alert, conversant. Feels better today. Ate more breakfast. Had BM today loose stool.   Objective: Vitals:   08/12/21 1957 08/12/21 2340 08/13/21 0300 08/13/21 0752  BP: (!) 103/57 (!) 98/48 (!) 91/49 (!) 94/53  Pulse: 86 66 60   Resp: 16 19 16 18   Temp: 98.1  F (36.7 C) 98.8 F (37.1 C) 97.8 F (36.6 C) 98.3 F (36.8 C)  TempSrc: Oral Oral Axillary Oral  SpO2: 98% 100% 97%   Weight:   92.4 kg   Height:        Intake/Output Summary (Last 24 hours) at 08/13/2021 0853 Last data filed at 08/12/2021 1830 Gross per 24 hour  Intake 1027.19 ml  Output --  Net 1027.19 ml    Filed Weights   08/11/21 1138 08/12/21 0500 08/13/21 0300  Weight: 87.9 kg 90.7 kg 92.4 kg    Examination:  General exam: NAD Respiratory system: CTA Cardiovascular system: S 1, S 2 RRR Gastrointestinal system: BS present, soft, nt  two incision healing.  peritoneal catheter in place.  Central nervous system: alert, follows command Extremities: trace edema   Data Reviewed: I have personally reviewed following labs and imaging studies  CBC: Recent Labs  Lab 08/06/21 1925 08/07/21 0626 08/08/21 0505 08/09/21 0322 08/10/21 0401 08/11/21 0830 08/12/21 1018  WBC 14.1*   < > 13.1* 12.0* 14.8* 10.0 10.9*  NEUTROABS 10.1*  --   --   --   --   --   --   HGB 9.9*   < > 8.3* 8.4* 8.4* 8.2* 8.0*  HCT  29.2*   < > 24.8* 24.7* 24.4* 24.2* 24.4*  MCV 96.7   < > 95.4 93.9 94.9 96.4 96.8  PLT 131*   < > 138* 159 144* 144* 152   < > = values in this interval not displayed.    Basic Metabolic Panel: Recent Labs  Lab 08/07/21 0010 08/07/21 0626 08/08/21 0505 08/09/21 0322 08/10/21 0401 08/11/21 0416 08/12/21 1018  NA  --  131* 128* 129* 130* 133* 133*  K  --  3.2* 3.2* 3.2* 3.6 3.0* 3.4*  CL  --  99 95* 96* 96* 94* 97*  CO2  --  18* 17* 17* 17* 23 20*  GLUCOSE  --  197* 233* 246* 105* 80 94  BUN  --  38* 44* 41* 45* 23 34*  CREATININE 6.70* 7.57* 7.87* 7.14* 8.14* 4.66* 6.20*  CALCIUM  --  9.1 9.4 9.5 9.2 9.1 8.9  MG 1.2* 1.5* 2.2 1.9  --   --   --   PHOS  --  5.6* 5.3* 5.3*  --  4.2  --     GFR: Estimated Creatinine Clearance: 8.5 mL/min (A) (by C-G formula based on SCr of 6.2 mg/dL (H)). Liver Function Tests: Recent Labs  Lab 08/06/21 1725  08/13/21 0059  AST 19 12*  ALT 13 13  ALKPHOS 126 81  BILITOT 0.7 0.8  PROT 6.1* 4.7*  ALBUMIN 2.4* 2.1*    Recent Labs  Lab 08/06/21 1725  LIPASE 28    No results for input(s): AMMONIA in the last 168 hours. Coagulation Profile: Recent Labs  Lab 08/06/21 1725  INR 1.0    Cardiac Enzymes: No results for input(s): CKTOTAL, CKMB, CKMBINDEX, TROPONINI in the last 168 hours. BNP (last 3 results) No results for input(s): PROBNP in the last 8760 hours. HbA1C: No results for input(s): HGBA1C in the last 72 hours.  CBG: Recent Labs  Lab 08/12/21 0655 08/12/21 1201 08/12/21 1708 08/12/21 2102 08/13/21 0751  GLUCAP 82 98 92 130* 161*    Lipid Profile: No results for input(s): CHOL, HDL, LDLCALC, TRIG, CHOLHDL, LDLDIRECT in the last 72 hours. Thyroid Function Tests: No results for input(s): TSH, T4TOTAL, FREET4, T3FREE, THYROIDAB in the last 72 hours.  Anemia Panel: No results for input(s): VITAMINB12, FOLATE, FERRITIN, TIBC, IRON, RETICCTPCT in the last 72 hours. Sepsis Labs: Recent Labs  Lab 08/06/21 1725 08/06/21 1925 08/12/21 0748 08/12/21 1018  LATICACIDVEN 3.0* 2.1* 1.2 1.6     Recent Results (from the past 240 hour(s))  Resp Panel by RT-PCR (Flu A&B, Covid) Nasopharyngeal Swab     Status: None   Collection Time: 08/06/21  5:25 PM   Specimen: Nasopharyngeal Swab; Nasopharyngeal(NP) swabs in vial transport medium  Result Value Ref Range Status   SARS Coronavirus 2 by RT PCR NEGATIVE NEGATIVE Final    Comment: (NOTE) SARS-CoV-2 target nucleic acids are NOT DETECTED.  The SARS-CoV-2 RNA is generally detectable in upper respiratory specimens during the acute phase of infection. The lowest concentration of SARS-CoV-2 viral copies this assay can detect is 138 copies/mL. A negative result does not preclude SARS-Cov-2 infection and should not be used as the sole basis for treatment or other patient management decisions. A negative result may occur with   improper specimen collection/handling, submission of specimen other than nasopharyngeal swab, presence of viral mutation(s) within the areas targeted by this assay, and inadequate number of viral copies(<138 copies/mL). A negative result must be combined with clinical observations, patient history, and epidemiological information. The expected  result is Negative.  Fact Sheet for Patients:  EntrepreneurPulse.com.au  Fact Sheet for Healthcare Providers:  IncredibleEmployment.be  This test is no t yet approved or cleared by the [place] FDA and  has been authorized for detection and/or diagnosis of SARS-CoV-2 by FDA under an Emergency Use Authorization (EUA). This EUA will remain  in effect (meaning this test can be used) for the duration of the COVID-19 declaration under Section 564(b)(1) of the Act, 21 U.S.C.section 360bbb-3(b)(1), unless the authorization is terminated  or revoked sooner.       Influenza A by PCR NEGATIVE NEGATIVE Final   Influenza B by PCR NEGATIVE NEGATIVE Final    Comment: (NOTE) The Xpert Xpress SARS-CoV-2/FLU/RSV plus assay is intended as an aid in the diagnosis of influenza from Nasopharyngeal swab specimens and should not be used as a sole basis for treatment. Nasal washings and aspirates are unacceptable for Xpert Xpress SARS-CoV-2/FLU/RSV testing.  Fact Sheet for Patients: EntrepreneurPulse.com.au  Fact Sheet for Healthcare Providers: IncredibleEmployment.be  This test is not yet approved or cleared by the [place] FDA and has been authorized for detection and/or diagnosis of SARS-CoV-2 by FDA under an Emergency Use Authorization (EUA). This EUA will remain in effect (meaning this test can be used) for the duration of the COVID-19 declaration under Section 564(b)(1) of the Act, 21 U.S.C. section 360bbb-3(b)(1), unless the authorization is terminated  or revoked.  Performed at [facility], [place] [address], [place], [place] [address]   Blood Culture (routine x 2)     Status: None   Collection Time: 08/06/21  5:25 PM   Specimen: BLOOD RIGHT FOREARM  Result Value Ref Range Status   Specimen Description BLOOD RIGHT FOREARM  Final   Special Requests   Final    BOTTLES DRAWN AEROBIC AND ANAEROBIC Blood Culture results may not be optimal due to an inadequate volume of blood received in culture bottles   Culture   Final    NO GROWTH 5 DAYS Performed at [facility], [place] [address] [place], [place] [address]    Report Status 08/11/2021 FINAL  Final  Blood Culture (routine x 2)     Status: None   Collection Time: 08/06/21  5:30 PM   Specimen: BLOOD RIGHT FOREARM  Result Value Ref Range Status   Specimen Description BLOOD RIGHT FOREARM  Final   Special Requests   Final    BOTTLES DRAWN AEROBIC AND ANAEROBIC Blood Culture results may not be optimal due to an inadequate volume of blood received in culture bottles   Culture   Final    NO GROWTH 5 DAYS Performed at [facility], [place] [address] [place], [place] [address]    Report Status 08/11/2021 FINAL  Final  MRSA Next Gen by PCR, Nasal     Status: None   Collection Time: 08/07/21 12:54 AM   Specimen: Nasal Mucosa; Nasal Swab  Result Value Ref Range Status   MRSA by PCR Next Gen NOT DETECTED NOT DETECTED Final    Comment: (NOTE) The GeneXpert MRSA Assay (FDA approved for NASAL specimens only), is one component of a comprehensive MRSA colonization surveillance program. It is not intended to diagnose MRSA infection nor to guide or monitor treatment for MRSA infections. Test performance is not FDA approved in patients less than 35 years old. Performed at [facility], [place] [address] [place], [place] [address]           Radiology Studies: No  results found.      Scheduled Meds:  (feeding supplement) PROSource Plus  30 mL Oral BID BM    acetaminophen  650 mg Oral Q6H   vitamin C  500 mg Oral BID   Chlorhexidine Gluconate Cloth  6 each Topical Q0600   Chlorhexidine Gluconate Cloth  6 each Topical Q0600   escitalopram  5 mg Oral Daily   feeding supplement  1 Container Oral TID BM   ferric citrate  420 mg Oral TID WC   gentamicin cream  1 application Topical Daily   heparin  5,000 Units Subcutaneous Q8H   hydrocortisone sod succinate (SOLU-CORTEF) inj  50 mg Intravenous Q12H   insulin aspart  0-6 Units Subcutaneous TID WC   levothyroxine  75 mcg Oral Q0600   midodrine  10 mg Oral TID WC   pantoprazole  40 mg Oral BID   saccharomyces boulardii  250 mg Oral BID   senna  1 tablet Oral QHS   simvastatin  40 mg Oral q1800   vitamin A  10,000 Units Oral Daily   zinc sulfate  220 mg Oral BID   Continuous Infusions:  sodium chloride Stopped (08/10/21 1039)   piperacillin-tazobactam 2.25 g (08/12/21 2206)     LOS: 7 days    Time spent: 35 minutes.     Elmarie Shiley, MD [facility]   If 7PM-7AM, please contact night-coverage [email or web address]  08/13/2021, 8:53 AM

## 2021-08-13 NOTE — Plan of Care (Signed)

## 2021-08-13 NOTE — Progress Notes (Signed)
Nutrition Follow-up  DOCUMENTATION CODES:   Not applicable  INTERVENTION:   - Ensure Enlive po daily, each supplement provides 350 kcal and 20 grams of protein  - Liberalize diet to Carb Modified to allow for more options and promote PO intake, verbal with readback order placed per Dr. Royce Macadamia (Nephrology)  - Continue vitamin C and zinc sulfate to aid in wound healing  - Encourage PO intake  - d/c Boost Breeze and ProSource as pt is refusing  NUTRITION DIAGNOSIS:   Increased nutrient needs related to post-op healing as evidenced by estimated needs.  Ongoing, being addressed via diet liberalization and oral nutrition supplements  GOAL:   Patient will meet greater than or equal to 90% of their needs  Progressing  MONITOR:   PO intake, Supplement acceptance, Diet advancement, Labs, Weight trends, Skin, I & O's  REASON FOR ASSESSMENT:   Rounds    ASSESSMENT:   75 year old female who presented to the ED on 9/05 with hypotension and near syncopal episode. PMH of ESRD on PD, AV block with pacemaker, DM, GERD, HLD, hypothyroidism. Pt admitted with sepsis, cholelithiasis.  9/08 - s/p lap cholecystectomy 9/10 - diet advanced to renal  Pt on PD at home but doing HD during hospital admission. Pt received HD today, no UF documented at this time. Pre-HD weight was 91 kg. No post-HD weight documented at this time.  Spoke with pt at bedside who is reporting nausea. RN in room administering IV zofran. Pt reports that today is the first time that she has experienced nausea since admission. Pt reports that yesterday she did not eat much because she felt "so sick." Pt reports that she wasn't experiencing nausea but just felt tired and ill.  Pt states that she does not drink the Boost Breeze or the ProSource because she does not like it. Pt states that the only supplement that she does drink is chocolate Ensure. Pt reports drinking one Ensure a day at home; she states that she starts in  the morning and sips on it all day. RD to order Ensure daily to aid pt in meeting kcal and protein needs.  Pt reports that she has very limited food options at meal times due to renal diet order. Discussed liberalizing diet order with Nephrology MD who was in agreement with Carb Modified diet order.  Admit weight: 83.4 kg Current weight: 91 kg Dry weight: 80 kg  Pt currently above EDW and with moderate pitting edema to BLE.  Meal Completion: 5-75%  Medications reviewed and include: ProSource Plus BID, vitamin C 500 mg BID, IV aranesp, Boost Breeze TID, ferric citrate, IV solu-cortef, protonix, florastor, senna, vitamin A 10,000 units daily, zinc sulfate 220 mg BID, IV abx  Labs reviewed: sodium 133, potassium 3.4, hemoglobin 8.0 CBG's: 92-161 x 24 hours  NUTRITION - FOCUSED PHYSICAL EXAM:  Flowsheet Row Most Recent Value  Orbital Region No depletion  Upper Arm Region No depletion  Thoracic and Lumbar Region No depletion  Buccal Region No depletion  Temple Region Mild depletion  Clavicle Bone Region Mild depletion  Clavicle and Acromion Bone Region Mild depletion  Scapular Bone Region No depletion  Dorsal Hand No depletion  Patellar Region No depletion  Anterior Thigh Region No depletion  Posterior Calf Region No depletion  Edema (RD Assessment) Moderate  [BLE]  Hair Reviewed  Eyes Reviewed  Mouth Reviewed  Skin Reviewed  Nails Reviewed       Diet Order:   Diet Order  Diet Carb Modified Fluid consistency: Thin; Room service appropriate? Yes; Fluid restriction: 1200 mL Fluid  Diet effective now                   EDUCATION NEEDS:   No education needs have been identified at this time  Skin:  Skin Assessment: Skin Integrity Issues: DTI: L buttocks  Last BM:  08/13/21 medium type 6  Height:   Ht Readings from Last 1 Encounters:  08/09/21 5\' 5"  (1.651 m)    Weight:   Wt Readings from Last 1 Encounters:  08/13/21 91 kg    BMI:  Body mass  index is 33.38 kg/m.  Estimated Nutritional Needs:   Kcal:  1700-1900  Protein:  95-115 grams  Fluid:  1.5 L/day    Gustavus Bryant, MS, RD, LDN Inpatient Clinical Dietitian Please see AMiON for contact information.

## 2021-08-14 LAB — BASIC METABOLIC PANEL
Anion gap: 12 (ref 5–15)
BUN: 16 mg/dL (ref 8–23)
CO2: 27 mmol/L (ref 22–32)
Calcium: 9.5 mg/dL (ref 8.9–10.3)
Chloride: 96 mmol/L — ABNORMAL LOW (ref 98–111)
Creatinine, Ser: 4.41 mg/dL — ABNORMAL HIGH (ref 0.44–1.00)
GFR, Estimated: 10 mL/min — ABNORMAL LOW (ref >60)
Glucose, Bld: 102 mg/dL — ABNORMAL HIGH (ref 70–99)
Potassium: 3.2 mmol/L — ABNORMAL LOW (ref 3.5–5.1)
Sodium: 135 mmol/L (ref 135–145)

## 2021-08-14 LAB — CBC
HCT: 22.7 % — ABNORMAL LOW (ref 36.0–46.0)
Hemoglobin: 7.5 g/dL — ABNORMAL LOW (ref 12.0–15.0)
MCH: 32.5 pg (ref 26.0–34.0)
MCHC: 33 g/dL (ref 30.0–36.0)
MCV: 98.3 fL (ref 80.0–100.0)
Platelets: 211 10*3/uL (ref 150–400)
RBC: 2.31 MIL/uL — ABNORMAL LOW (ref 3.87–5.11)
RDW: 13.6 % (ref 11.5–15.5)
WBC: 13.8 10*3/uL — ABNORMAL HIGH (ref 4.0–10.5)
nRBC: 0.1 % (ref 0.0–0.2)

## 2021-08-14 LAB — GLUCOSE, CAPILLARY
Glucose-Capillary: 112 mg/dL — ABNORMAL HIGH (ref 70–99)
Glucose-Capillary: 114 mg/dL — ABNORMAL HIGH (ref 70–99)
Glucose-Capillary: 171 mg/dL — ABNORMAL HIGH (ref 70–99)
Glucose-Capillary: 155 mg/dL — ABNORMAL HIGH (ref 70–99)

## 2021-08-14 LAB — PREPARE RBC (CROSSMATCH)

## 2021-08-14 MED ORDER — SODIUM CHLORIDE 0.9% IV SOLUTION: Freq: Once | INTRAVENOUS | Status: AC

## 2021-08-14 MED ORDER — POTASSIUM CHLORIDE CRYS ER 20 MEQ PO TBCR
20.0000 meq | EXTENDED_RELEASE_TABLET | Freq: Once | ORAL | Status: AC
  Administered 2021-08-14: 20 meq via ORAL
  Filled 2021-08-14: qty 1

## 2021-08-14 MED ORDER — CHLORHEXIDINE GLUCONATE CLOTH 2 % EX PADS
6.0000 | MEDICATED_PAD | Freq: Every day | CUTANEOUS | Status: DC
  Administered 2021-08-15: 6 via TOPICAL

## 2021-08-14 NOTE — Progress Notes (Signed)
Physical Therapy Treatment Patient Details Name: MONSERRATH JUNIO MRN: [id number] DOB: Nov 25, 1946 Today's Date: 08/14/2021   History of Present Illness 75 y.o. female presents to [facility] ED on 08/06/2021 with weakness and dizziness, recently running out of midodrine. Pt with syncopal episode the day prior to admission. Systolic BPs in 94R in ED. CT abdomen demonstrates gallbladder stone with underlying sludge. Pt admitted for septic shock and acute cholecystitis. PMH includes ESRD on peritoneal dialysis, AV block with pacemaker, diabetes, GERD, hyperlipidemia, hypothyroidism, arthritis.    PT Comments    Pt continues with dizziness upon sitting up and standing however Dr. Einar Gip came in during session and stated that he would be making changes to her pacemaker that would help. Pt continues to require assist for safe transfers and has limited ambulation tolerance due to dizziness and SOB. BP also soft. Pt is unsafe to return home alone and would need 24/7 assist if her choices of SNF aren't available. Acute PT to cont to follow.    Recommendations for follow up therapy are one component of a multi-disciplinary discharge planning process, led by the attending physician.  Recommendations may be updated based on patient status, additional functional criteria and insurance authorization.  Follow Up Recommendations  SNF (pt is agreeable to 2 facilities, one of those being [facility]. If pt is declined placement then PT recommends HHPT and assistance from family for all out of bed activity.)     Equipment Recommendations  None recommended by PT    Recommendations for Other Services       Precautions / Restrictions Precautions Precautions: Fall Precaution Comments: dizziness with upright position Restrictions Weight Bearing Restrictions: No     Mobility  Bed Mobility Overal bed mobility: Needs Assistance Bed Mobility: Rolling;Sidelying to Sit Rolling: Supervision Sidelying to sit: HOB elevated;Min  assist       General bed mobility comments: minA for trunk elevation due to fatigue from throwing up yesterday per pt report    Transfers Overall transfer level: Needs assistance Equipment used: Rolling walker (2 wheeled) Transfers: Sit to/from Stand Sit to Stand: Min assist         General transfer comment: minA to power up, 3 trials, used momementun and raised bed  Ambulation/Gait Ambulation/Gait assistance: Herbalist (Feet): 10 Feet (x1, 12x1) Assistive device: Rolling walker (2 wheeled) Gait Pattern/deviations: Step-to pattern;Trunk flexed Gait velocity: reduced Gait velocity interpretation: <1.31 ft/sec, indicative of household ambulator General Gait Details: pt with short step height and shuffled gait pattern due to fatigue and decreased activity tolerance. pt amb from bed to door and had to sit then ambulated back   Stairs             Wheelchair Mobility    Modified Rankin (Stroke Patients Only)       Balance Overall balance assessment: Needs assistance Sitting-balance support: No upper extremity supported;Feet supported Sitting balance-Leahy Scale: Fair     Standing balance support: Bilateral upper extremity supported Standing balance-Leahy Scale: Poor Standing balance comment: reliant on UE support of walker                            Cognition Arousal/Alertness: Awake/alert Behavior During Therapy: WFL for tasks assessed/performed Overall Cognitive Status: Within Functional Limits for tasks assessed                                 General  Comments: pt anxious regarding dizziness and inability to amb as she wants to go home      Exercises      General Comments General comments (skin integrity, edema, etc.): BP on softer side in 100s while sitting EOB, pt continues with dizziness with mobiltiy, Dr. Nadyne Coombes came in and told pt he was going to make adjustments to her pacemaker that should help       Pertinent Vitals/Pain Pain Assessment: No/denies pain    Home Living                      Prior Function            PT Goals (current goals can now be found in the care plan section) Acute Rehab PT Goals PT Goal Formulation: With patient Time For Goal Achievement: 08/25/21 Potential to Achieve Goals: Fair Progress towards PT goals: Progressing toward goals    Frequency    Min 3X/week      PT Plan Current plan remains appropriate    Co-evaluation              AM-PAC PT "6 Clicks" Mobility   Outcome Measure  Help needed turning from your back to your side while in a flat bed without using bedrails?: A Little Help needed moving from lying on your back to sitting on the side of a flat bed without using bedrails?: A Little Help needed moving to and from a bed to a chair (including a wheelchair)?: A Little Help needed standing up from a chair using your arms (e.g., wheelchair or bedside chair)?: A Little Help needed to walk in hospital room?: A Little Help needed climbing 3-5 steps with a railing? : A Lot 6 Click Score: 17    End of Session Equipment Utilized During Treatment: Gait belt Activity Tolerance: Patient limited by fatigue (dizziness) Patient left: in chair;with call bell/phone within reach;with chair alarm set Nurse Communication: Mobility status PT Visit Diagnosis: Other abnormalities of gait and mobility (R26.89);Muscle weakness (generalized) (M62.81)     Time: 0930-1004 PT Time Calculation (min) (ACUTE ONLY): 34 min  Charges:  $Gait Training: 23-37 mins                     Kittie Plater, PT, DPT Acute Rehabilitation Services Pager #: [phone] Office #: [phone]    Berline Lopes 08/14/2021, 1:38 PM

## 2021-08-14 NOTE — TOC Progression Note (Signed)
Transition of Care [facility]) - Progression Note    Patient Details  Name: Erika Fry MRN: [id number] Date of Birth: 08-17-1946  Transition of Care [facility] CM/SW Contact  Carles Collet, RN Phone Number: 08/14/2021, 3:43 PM  Clinical Narrative:    Damaris Schooner w patient at bedside. Discussed [place] providers. Informed her that [facility] was not able to accept as they have in the past. [facility] was able to accept referral.  Patient states that she has RW, WC, shower seat, at home, she denies needs for additional DME. She states that family will be able to provide her transportation home.     Expected Discharge Plan: [place] Barriers to Discharge: Continued Medical Work up  Expected Discharge Plan and Services Expected Discharge Plan: [place] In-house Referral: Clinical Social Work Discharge Planning Services: CM Consult Post Acute Care Choice: [place] arrangements for the past 2 months: Single Family Home                 DME Arranged: N/A         HH Arranged: PT, OT HH Agency: [place] Date [place]: 08/14/21 Time [place]: 1448 Representative spoke with at [place]: cory   Social Determinants of Health ([place]) Interventions    Readmission Risk Interventions Readmission Risk Prevention Plan 08/13/2021 12/29/2019 06/15/2019  Transportation Screening Complete Complete Complete  Medication Review Press photographer) Complete Complete Complete  PCP or Specialist appointment within 3-5 days of discharge Complete (No Data) Not Complete  PCP/Specialist Appt Not Complete comments - - not ready for dc  HRI or Home Care Consult Complete Complete Complete  SW Recovery Care/Counseling Consult Complete - Complete  Palliative Care Screening Not Applicable Not Applicable Not [place] Patient Refused Not Applicable Not Applicable  Some recent data might be hidden

## 2021-08-14 NOTE — Progress Notes (Signed)
PROGRESS NOTE    Erika Fry  PFX:[id number] DOB: 20-Jan-1946 DOA: 08/06/2021 PCP: [name], MD   Brief Narrative: 75 year old with past medical history significant for ESRD on peritoneal dialysis, AV block with pacemaker, diabetes, GERD, hyperlipidemia, hypothyroidism, arthritis who presented with generalized weakness and dizziness.  Patient ran out of her midodrine for over a week secondary to shortage at the pharmacy.  She had a syncopal episode day prior to admission.  Presented to the ED because she remained dizzy, complaining of abdominal pain after eating and reduced appetite.  Patient was initially hypotensive with systolic blood pressure in the 60 improved with 3.5 Lof fluids , MAP remains less than 65 so she was a started on peripheral Levophed.  Lactic acid up to 3.0.  CT abdomen and pelvis showed gallbladder stone and layering sludge without evidence of cholecystitis with moderate to severe right side hydronephrosis without obstructing calculus.   Patient was admitted with septic shock secondary to cholecystitis.  HIDA scan positive for cholecystitis. Underwent  cholecystectomy 9/8.  Patient care transfer From CCM service to triad on 9/8.  Assessment & Plan:   Principal Problem:   Septic shock ([place]) Active Problems:   Chronic kidney disease (CKD), stage IV (severe) (HCC)   Hypothyroidism   Hypokalemia   Chronic heart failure with preserved ejection fraction (HCC)   Pacemaker Medtronic Azure XT DR MRI Dual Chamber Pacemaker 03/25/2018   Diabetes mellitus type 2 in obese ([place])   ESRD on peritoneal dialysis ([place])   Hypotension   Pain of upper abdomen  1-Septic shock, component of hypovolemic shock, intra-abdominal source of infection, cholecystitis: -Patient presented with hypotension which did not improve with IV fluids.  She was a started on Levophed. -Patient presented with leukocytosis, hypotension, lactic acidosis, source of infection cholecystitis. -She is  now transition to home dose midodrine.  White blood cell trending down. -Received Zosyn 5 days post op.  -S/P Cholecystectomy 9/8. -Cortisol at 10.  She was a started on IV steroids. Steroids discontinue 9/09. Back on hydrocortisone 9/11 due to low blood pressure.  -Ok to discontinue antibiotics per Sx. Antibiotics discontinue 9/12.  Hypotension/ acute on Chronic.  -She was on midodrine at home.  -Hb stable. Lactic acid normal. She has received IV fluids and Albumin.  -I discussed with CCM (9/11)  patient doesn't have sign of active infection, OK for BP in the  high 88 MAP 55 if she has good mentation.  -Midodrine was increase to 15 mg TID  -ECHO. Hyperdynamic LV LVOT -IV steroids, hydrocortisone, resume 9/11. -Had low BP during HD, improved with albumin.  -Dr Einar Gip started pseudoephedrine and increase midodrine. He will  reprogrammed Pacemaker.  Hb down to 7.5 will proceed with one unit PRBC.   2-Acute cholecystitis, cholelithiasis: -HIDA scan positive for cholecystitis.  Ultrasound positive for gallbladder sludge and possible small stone. -Patient presented with leukocytosis and postprandial abdominal pain.  Septic shock. -Underwent cholecystectomy 9/8 -Appreciate Surgery evaluation. They will follow up on patient today.  -Oxycodone PRN, careful with Hypotension.  - Diet advanced to renal diet.  Vomited last night, feels better today.   3-ESRD on peritoneal dialysis: Nephrology following. Will need to transition to hemodialysis for 2 weeks post surgery. Needs Clip process.  Nephrology recommend serial HD in the hospital.   4-Hypokalemia, hyponatremia: Correction with dialysis. Replete K orally today   5-Anion gap metabolic acidosis: Gap of 16.  Secondary to his ESRD and lactic acidosis Hypothyroidism: Continue with Synthroid.   6-Chronic dyspnea, DLCO/VA  83% consistent with mild obstructive disease. Continue with PRN albuterol  7-Chronic leg pain: Continue with  hydrocodone Hyperglycemia: Suspect secondary to stress dose steroid: Continue with a sliding scale. -Monitor off steroids.  CBG normalized.  Hypoalbuminemia;  Continue with Breeze.  Nutritionist consulted.   The wound care documentation below: POA                  Pressure Injury 08/07/21 Buttocks Left Deep Tissue Pressure Injury - Purple or maroon localized area of discolored intact skin or blood-filled blister due to damage of underlying soft tissue from pressure and/or shear. (Active)  08/07/21 0100  Location: Buttocks  Location Orientation: Left  Staging: Deep Tissue Pressure Injury - Purple or maroon localized area of discolored intact skin or blood-filled blister due to damage of underlying soft tissue from pressure and/or shear.  Wound Description (Comments):   Present on Admission: Yes    Interventions: Prostat, Boost Breeze  Estimated body mass index is 33.24 kg/m as calculated from the following:   Height as of this encounter: 5\' 5"  (1.651 m).   Weight as of this encounter: 90.6 kg.   DVT prophylaxis: Heparin Code Status: Full code Family Communication: Daughter in law updated 9/12. Disposition Plan:  Status is: Inpatient  Remains inpatient appropriate because:IV treatments appropriate due to intensity of illness or inability to take PO  Dispo: The patient is from: Home              Anticipated d/c is to:  SNF              Patient currently is not medically stable to d/c. Needs serial HD, awaiting stabilization of BP.    Difficult to place patient No        Consultants:  General surgery CCM admitted patient  Procedures:  Cholecystectomy  9/8  Antimicrobials:    Subjective: She vomited twice last night. Had BM as well.  Feels better today, tolerated breakfast, denies abdominal pain  Objective: Vitals:   08/14/21 1210 08/14/21 1432 08/14/21 1435 08/14/21 1502  BP: (!) 104/52 (!) 93/49 (!) 102/49 (!) 101/47  Pulse: 65 71 72 65  Resp: 14  (!) 21 16 14   Temp: 98.7 F (37.1 C) 97.9 F (36.6 C) 97.8 F (36.6 C) 97.8 F (36.6 C)  TempSrc: Oral Tympanic Oral Oral  SpO2: 100% 98% 96% 96%  Weight:      Height:        Intake/Output Summary (Last 24 hours) at 08/14/2021 1532 Last data filed at 08/13/2021 2100 Gross per 24 hour  Intake 120 ml  Output 400 ml  Net -280 ml    Filed Weights   08/13/21 0840 08/13/21 1225 08/14/21 0405  Weight: 91 kg 90.5 kg 90.6 kg    Examination:  General exam: NAD Respiratory system: CTA Cardiovascular system: S 1, S 2 RRR Gastrointestinal system: BS present, soft, nt peritoneal catheter in place.  Central nervous system: Alert, follows command.  Extremities: Trace edema   Data Reviewed: I have personally reviewed following labs and imaging studies  CBC: Recent Labs  Lab 08/09/21 0322 08/10/21 0401 08/11/21 0830 08/12/21 1018 08/14/21 0634  WBC 12.0* 14.8* 10.0 10.9* 13.8*  HGB 8.4* 8.4* 8.2* 8.0* 7.5*  HCT 24.7* 24.4* 24.2* 24.4* 22.7*  MCV 93.9 94.9 96.4 96.8 98.3  PLT 159 144* 144* 152 630    Basic Metabolic Panel: Recent Labs  Lab 08/08/21 0505 08/09/21 0322 08/10/21 0401 08/11/21 0416 08/12/21 1018 08/14/21 0634  NA 128*  129* 130* 133* 133* 135  K 3.2* 3.2* 3.6 3.0* 3.4* 3.2*  CL 95* 96* 96* 94* 97* 96*  CO2 17* 17* 17* 23 20* 27  GLUCOSE 233* 246* 105* 80 94 102*  BUN 44* 41* 45* 23 34* 16  CREATININE 7.87* 7.14* 8.14* 4.66* 6.20* 4.41*  CALCIUM 9.4 9.5 9.2 9.1 8.9 9.5  MG 2.2 1.9  --   --   --   --   PHOS 5.3* 5.3*  --  4.2  --   --     GFR: Estimated Creatinine Clearance: 11.9 mL/min (A) (by C-G formula based on SCr of 4.41 mg/dL (H)). Liver Function Tests: Recent Labs  Lab 08/13/21 0059  AST 12*  ALT 13  ALKPHOS 81  BILITOT 0.8  PROT 4.7*  ALBUMIN 2.1*    No results for input(s): LIPASE, AMYLASE in the last 168 hours.  No results for input(s): AMMONIA in the last 168 hours. Coagulation Profile: No results for input(s): INR, PROTIME  in the last 168 hours.  Cardiac Enzymes: No results for input(s): CKTOTAL, CKMB, CKMBINDEX, TROPONINI in the last 168 hours. BNP (last 3 results) No results for input(s): PROBNP in the last 8760 hours. HbA1C: No results for input(s): HGBA1C in the last 72 hours.  CBG: Recent Labs  Lab 08/13/21 1404 08/13/21 1644 08/13/21 2023 08/14/21 0810 08/14/21 1209  GLUCAP 115* 111* 130* 112* 114*    Lipid Profile: No results for input(s): CHOL, HDL, LDLCALC, TRIG, CHOLHDL, LDLDIRECT in the last 72 hours. Thyroid Function Tests: No results for input(s): TSH, T4TOTAL, FREET4, T3FREE, THYROIDAB in the last 72 hours.  Anemia Panel: No results for input(s): VITAMINB12, FOLATE, FERRITIN, TIBC, IRON, RETICCTPCT in the last 72 hours. Sepsis Labs: Recent Labs  Lab 08/12/21 0748 08/12/21 1018  LATICACIDVEN 1.2 1.6     Recent Results (from the past 240 hour(s))  Resp Panel by RT-PCR (Flu A&B, Covid) Nasopharyngeal Swab     Status: None   Collection Time: 08/06/21  5:25 PM   Specimen: Nasopharyngeal Swab; Nasopharyngeal(NP) swabs in vial transport medium  Result Value Ref Range Status   SARS Coronavirus 2 by RT PCR NEGATIVE NEGATIVE Final    Comment: (NOTE) SARS-CoV-2 target nucleic acids are NOT DETECTED.  The SARS-CoV-2 RNA is generally detectable in upper respiratory specimens during the acute phase of infection. The lowest concentration of SARS-CoV-2 viral copies this assay can detect is 138 copies/mL. A negative result does not preclude SARS-Cov-2 infection and should not be used as the sole basis for treatment or other patient management decisions. A negative result may occur with  improper specimen collection/handling, submission of specimen other than nasopharyngeal swab, presence of viral mutation(s) within the areas targeted by this assay, and inadequate number of viral copies(<138 copies/mL). A negative result must be combined with clinical observations, patient history,  and epidemiological information. The expected result is Negative.  Fact Sheet for Patients:  EntrepreneurPulse.com.au  Fact Sheet for Healthcare Providers:  IncredibleEmployment.be  This test is no t yet approved or cleared by the [place] FDA and  has been authorized for detection and/or diagnosis of SARS-CoV-2 by FDA under an Emergency Use Authorization (EUA). This EUA will remain  in effect (meaning this test can be used) for the duration of the COVID-19 declaration under Section 564(b)(1) of the Act, 21 U.S.C.section 360bbb-3(b)(1), unless the authorization is terminated  or revoked sooner.       Influenza A by PCR NEGATIVE NEGATIVE Final   Influenza B by  PCR NEGATIVE NEGATIVE Final    Comment: (NOTE) The Xpert Xpress SARS-CoV-2/FLU/RSV plus assay is intended as an aid in the diagnosis of influenza from Nasopharyngeal swab specimens and should not be used as a sole basis for treatment. Nasal washings and aspirates are unacceptable for Xpert Xpress SARS-CoV-2/FLU/RSV testing.  Fact Sheet for Patients: EntrepreneurPulse.com.au  Fact Sheet for Healthcare Providers: IncredibleEmployment.be  This test is not yet approved or cleared by the [place] FDA and has been authorized for detection and/or diagnosis of SARS-CoV-2 by FDA under an Emergency Use Authorization (EUA). This EUA will remain in effect (meaning this test can be used) for the duration of the COVID-19 declaration under Section 564(b)(1) of the Act, 21 U.S.C. section 360bbb-3(b)(1), unless the authorization is terminated or revoked.  Performed at [facility], [place] [address], [place], [place] [address]   Blood Culture (routine x 2)     Status: None   Collection Time: 08/06/21  5:25 PM   Specimen: BLOOD RIGHT FOREARM  Result Value Ref Range Status   Specimen Description BLOOD RIGHT FOREARM  Final   Special Requests   Final     BOTTLES DRAWN AEROBIC AND ANAEROBIC Blood Culture results may not be optimal due to an inadequate volume of blood received in culture bottles   Culture   Final    NO GROWTH 5 DAYS Performed at [facility], [place] [address], [place], [place] [address]    Report Status 08/11/2021 FINAL  Final  Blood Culture (routine x 2)     Status: None   Collection Time: 08/06/21  5:30 PM   Specimen: BLOOD RIGHT FOREARM  Result Value Ref Range Status   Specimen Description BLOOD RIGHT FOREARM  Final   Special Requests   Final    BOTTLES DRAWN AEROBIC AND ANAEROBIC Blood Culture results may not be optimal due to an inadequate volume of blood received in culture bottles   Culture   Final    NO GROWTH 5 DAYS Performed at [facility], [place] [address] [place], [place] [address]    Report Status 08/11/2021 FINAL  Final  MRSA Next Gen by PCR, Nasal     Status: None   Collection Time: 08/07/21 12:54 AM   Specimen: Nasal Mucosa; Nasal Swab  Result Value Ref Range Status   MRSA by PCR Next Gen NOT DETECTED NOT DETECTED Final    Comment: (NOTE) The GeneXpert MRSA Assay (FDA approved for NASAL specimens only), is one component of a comprehensive MRSA colonization surveillance program. It is not intended to diagnose MRSA infection nor to guide or monitor treatment for MRSA infections. Test performance is not FDA approved in patients less than 83 years old. Performed at [facility], [place] [address], [place], [place] [address]           Radiology Studies: ECHOCARDIOGRAM COMPLETE  Result Date: 08/13/2021    ECHOCARDIOGRAM REPORT   Patient Name:   Erika Fry Date of Exam: 08/13/2021 Medical Rec #:  [id number]          Height:       65.0 in Accession #:    [id number]         Weight:       200.6 lb Date of Birth:  05/04/1946          BSA:          1.981 m Patient Age:    23 years  BP:           90/70 mmHg Patient Gender: F                  HR:           93 bpm. Exam  Location:  Inpatient Procedure: 2D Echo Indications:     Hypotension  History:         Patient has prior history of Echocardiogram examinations.  Referring Phys:  [id number] Elmarie Shiley Diagnosing Phys: Adrian Prows MD IMPRESSIONS  1. Left ventricular ejection fraction, by estimation, is >75%. The left ventricle has hyperdynamic function. The left ventricle has no regional wall motion abnormalities. There is moderate left ventricular hypertrophy. Left ventricular diastolic function could not be evaluated.  2. Right ventricular systolic function is normal. The right ventricular size is normal. There is moderately elevated pulmonary artery systolic pressure.  3. Left atrial size was severely dilated.  4. Right atrial size was severely dilated.  5. The mitral valve is normal in structure. Mild mitral valve regurgitation. No evidence of mitral stenosis.  6. Tricuspid valve regurgitation is moderate.  7. The aortic valve is tricuspid. There is mild calcification of the aortic valve. Aortic valve regurgitation is not visualized. Mild to moderate aortic valve stenosis. Aortic valve area, by VTI measures 1.18 cm. Aortic valve mean gradient measures 33.0 mmHg. Aortic valve Vmax measures 3.80 m/s.  8. The inferior vena cava is dilated in size with >50% respiratory variability, suggesting right atrial pressure of 8 mmHg. FINDINGS  Left Ventricle: Mid cavitary and LVOT obstruction noted. Left ventricular ejection fraction, by estimation, is >75%. The left ventricle has hyperdynamic function. The left ventricle has no regional wall motion abnormalities. The left ventricular internal cavity size was normal in size. There is moderate left ventricular hypertrophy. Left ventricular diastolic function could not be evaluated. Right Ventricle: The right ventricular size is normal. No increase in right ventricular wall thickness. Right ventricular systolic function is normal. There is moderately elevated pulmonary artery systolic  pressure. The tricuspid regurgitant velocity is 3.18 m/s, and with an assumed right atrial pressure of 8 mmHg, the estimated right ventricular systolic pressure is 57.3 mmHg. Left Atrium: Left atrial size was severely dilated. Right Atrium: Right atrial size was severely dilated. Pericardium: There is no evidence of pericardial effusion. Mitral Valve: The mitral valve is normal in structure. Mild mitral annular calcification. Mild mitral valve regurgitation. No evidence of mitral valve stenosis. Tricuspid Valve: The tricuspid valve is normal in structure. Tricuspid valve regurgitation is moderate. Aortic Valve: Most pressure gradient appears to come from LVOT obstruction and LV intraventricular pressure gradient. The aortic valve is tricuspid. There is mild calcification of the aortic valve. Aortic valve regurgitation is not visualized. Mild to moderate aortic stenosis is present. Aortic valve mean gradient measures 33.0 mmHg. Aortic valve peak gradient measures 57.8 mmHg. Aortic valve area, by VTI measures 1.18 cm. Pulmonic Valve: The pulmonic valve was normal in structure. Pulmonic valve regurgitation is trivial. No evidence of pulmonic stenosis. Aorta: The aortic root is normal in size and structure. Venous: The inferior vena cava is dilated in size with greater than 50% respiratory variability, suggesting right atrial pressure of 8 mmHg. IAS/Shunts: No atrial level shunt detected by color flow Doppler. Additional Comments: A device lead is visualized.  LEFT VENTRICLE PLAX 2D LVIDd:         4.10 cm  Diastology LVIDs:         2.60 cm  LV e'  medial:    4.57 cm/s LV PW:         1.20 cm  LV E/e' medial:  32.6 LV IVS:        1.50 cm  LV e' lateral:   6.31 cm/s LVOT diam:     2.20 cm  LV E/e' lateral: 23.6 LV SV:         112 LV SV Index:   57 LVOT Area:     3.80 cm  RIGHT VENTRICLE RV Basal diam:  3.60 cm RV S prime:     12.70 cm/s TAPSE (M-mode): 1.9 cm LEFT ATRIUM              Index       RIGHT ATRIUM            Index LA diam:        6.60 cm  3.33 cm/m  RA Area:     22.10 cm LA Vol (A2C):   134.0 ml 67.65 ml/m RA Volume:   67.50 ml  34.08 ml/m LA Vol (A4C):   148.0 ml 74.72 ml/m LA Biplane Vol: 142.0 ml 71.69 ml/m  AORTIC VALVE AV Area (Vmax):    1.52 cm AV Area (Vmean):   1.46 cm AV Area (VTI):     1.18 cm AV Vmax:           380.00 cm/s AV Vmean:          271.000 cm/s AV VTI:            0.947 m AV Peak Grad:      57.8 mmHg AV Mean Grad:      33.0 mmHg LVOT Vmax:         152.00 cm/s LVOT Vmean:        104.000 cm/s LVOT VTI:          0.295 m LVOT/AV VTI ratio: 0.31  AORTA Ao Root diam: 3.50 cm Ao Asc diam:  3.30 cm MITRAL VALVE                TRICUSPID VALVE MV Area (PHT): 2.50 cm     TR Peak grad:   40.4 mmHg MV Decel Time: 304 msec     TR Vmax:        318.00 cm/s MV E velocity: 149.00 cm/s MV A velocity: 204.00 cm/s  SHUNTS MV E/A ratio:  0.73         Systemic VTI:  0.30 m                             Systemic Diam: 2.20 cm Adrian Prows MD Electronically signed by Adrian Prows MD Signature Date/Time: 08/13/2021/5:32:09 PM    Final         Scheduled Meds:  acetaminophen  650 mg Oral Q6H   vitamin C  500 mg Oral BID   Chlorhexidine Gluconate Cloth  6 each Topical Q0600   Chlorhexidine Gluconate Cloth  6 each Topical Q0600   darbepoetin (ARANESP) injection - DIALYSIS  60 mcg Intravenous Q Mon-HD   escitalopram  5 mg Oral Daily   feeding supplement  237 mL Oral Q24H   ferric citrate  420 mg Oral TID WC   gentamicin cream  1 application Topical Daily   heparin  5,000 Units Subcutaneous Q8H   insulin aspart  0-6 Units Subcutaneous TID WC   levothyroxine  75 mcg Oral Q0600   midodrine  15 mg Oral  TID WC   pantoprazole  40 mg Oral BID   pseudoephedrine  30 mg Oral TID   saccharomyces boulardii  250 mg Oral BID   senna  1 tablet Oral QHS   simvastatin  40 mg Oral q1800   vitamin A  10,000 Units Oral Daily   zinc sulfate  220 mg Oral BID   Continuous Infusions:  sodium chloride Stopped (08/10/21 1039)      LOS: 8 days    Time spent: 35 minutes.     Elmarie Shiley, MD [facility]   If 7PM-7AM, please contact night-coverage [email or web address]  08/14/2021, 3:32 PM

## 2021-08-14 NOTE — Progress Notes (Signed)
[facility] Progress Note  Subjective: last HD on 9/12 with 0.8 kg UF.  yesterday's tx complicated by hypotension which limited UF.  States she felt bad after HD yesterday.  Was on HD before switching out PD but this was a while ago  Review of systems:  N/v yesterday both resolved Denies chest pain  Denies shortness of breath  Vitals:   08/13/21 2338 08/14/21 0400 08/14/21 0405 08/14/21 0804  BP: (!) 99/51 104/60  (!) 88/56  Pulse: 73 81  81  Resp: 15 17  14   Temp: 98 F (36.7 C) 98 F (36.7 C)  98.2 F (36.8 C)  TempSrc: Oral Oral  Oral  SpO2: 96% 94%  100%  Weight:   90.6 kg   Height:        Exam:  General adult female in bed in no acute distress  HEENT normocephalic atraumatic extraocular movements intact sclera anicteric Neck supple trachea midline Lungs clear to auscultation bilaterally normal work of breathing at rest room air Heart S1S2 no rub Abdomen soft nondistended Extremities trace to 1+ edema lower extremities and edema in upper extremities 1-2+ Psych normal mood and affect Neuro - alert and oriented x 3 provides hx and follows commands  RUE AVF bruit and thrill; PD catheter in place   Dialysis: [facility] 7x/week, [facility]   6 exchanges, dwell 1.5hrs, fill vol 2800cc, 80kg dry wt    Assessment/Plan: Cholecysitis - +HIDA scan showed non-visualized GB. US showed sludge and stones. SP lap chole on 9/08, IV abx per primary team  Syncopal episode/Dizziness/Hypotension- sp 3 L bolus in ED, getting midodrine here and BP's better. Off pressors.  ESRD - on CPPD at home. Now on temporary HD while waiting for surgical wounds to heal, 2 wks.  Has R arm AVF HD per MWF schedule  Chronic hypotension, on midodrine 5 bid at home per charting which was increased to 10 mg tid here.  Volume overload. stood to weight on 9/10 up and was 11kg up.  On room air, no resp issues. BP limits UF. May need serial HD but staffing makes this difficult.  Noted midodrine has now been  increased to 15 mg TID Anemia of CKD - started ESA 60 mcg on 9/12. PRBC's today per primary - agree Secondary Hyperparathyroidism - on [place]. monitor trends. Phos in am      Recent Labs  Lab 08/09/21 0322 08/10/21 0401 08/11/21 0416 08/11/21 0830 08/12/21 1018 08/14/21 0634  K 3.2*   < > 3.0*  --  3.4* 3.2*  BUN 41*   < > 23  --  34* 16  CREATININE 7.14*   < > 4.66*  --  6.20* 4.41*  CALCIUM 9.5   < > 9.1  --  8.9 9.5  PHOS 5.3*  --  4.2  --   --   --   HGB 8.4*   < >  --    < > 8.0* 7.5*   < > = values in this interval not displayed.   Inpatient medications:  sodium chloride   Intravenous Once   acetaminophen  650 mg Oral Q6H   vitamin C  500 mg Oral BID   Chlorhexidine Gluconate Cloth  6 each Topical Q0600   Chlorhexidine Gluconate Cloth  6 each Topical Q0600   darbepoetin (ARANESP) injection - DIALYSIS  60 mcg Intravenous Q Mon-HD   escitalopram  5 mg Oral Daily   feeding supplement  237 mL Oral Q24H   ferric citrate  420 mg Oral TID WC   gentamicin cream  1 application Topical Daily   heparin  5,000 Units Subcutaneous Q8H   insulin aspart  0-6 Units Subcutaneous TID WC   levothyroxine  75 mcg Oral Q0600   midodrine  15 mg Oral TID WC   pantoprazole  40 mg Oral BID   pseudoephedrine  30 mg Oral TID   saccharomyces boulardii  250 mg Oral BID   senna  1 tablet Oral QHS   simvastatin  40 mg Oral q1800   vitamin A  10,000 Units Oral Daily   zinc sulfate  220 mg Oral BID    sodium chloride Stopped (08/10/21 1039)   calcium carbonate, diclofenac Sodium, docusate sodium, ondansetron (ZOFRAN) IV, oxyCODONE, polyethylene glycol, prochlorperazine   Claudia Desanctis, MD 08/14/2021 11:40 AM

## 2021-08-14 NOTE — CV Procedure (Signed)
In person pacemaker check 08/14/2021 Single (S)/Dual (D)/BV: D. Presenting ASVP. Pacemaker dependant:  Yes. Underlying CHB. AP <10%, VP 100%.   No significant mode switch episodes or HVR episodes (Brief VT) Longevity 4.3 Years. Magnet rate: >85%. Lead measurements: Stable.  Observations: Normal pacemaker function. Changes: Reduced upper tracking rate to 80 beats per min.   Adrian Prows, MD, [facility] 08/14/2021, 9:04 PM Office: [phone] Fax: [phone] Pager: [phone]

## 2021-08-14 NOTE — Progress Notes (Signed)
Subjective:  Erika Fry is a 75 y.o. Caucasian female with hypertension, morbid obesity, chronic hypotension and midodrine at home, CKD now on HD, type 2 DM, hyperlipidemia, s/p Medtronic dual chamber pacemaker for symptomatic high grade AV block, hypothyroidism, anemia, osteopenia, bilateral chronic lower extremity venous insufficiency. History of Covid-19 pneumonia 12/2019.    Admitted to the hospital with acute cholecystitis, underwent laparoscopic cholecystectomy.  Patient has been having some difficulty with physical therapy due to low blood pressure, I was asked to see the patient to evaluate for reversible causes of hypotension.  She walked with help of physical therapy today but was generally weak.  Denies chest pain or shortness of breath.  Intake/Output from previous day:  I/O last 3 completed shifts: In: 120 [P.O.:120] Out: 1201 [Emesis/NG output:400; Other:801] No intake/output data recorded.  Blood pressure (!) 104/52, pulse 65, temperature 98.7 F (37.1 C), temperature source Oral, resp. rate 14, height 5' 5"  (1.651 m), weight 90.6 kg, SpO2 100 %. Body mass index is 33.24 kg/m.   Vitals with BMI 08/14/2021 08/14/2021 08/14/2021  Height - - -  Weight - - 199 lbs 12 oz  BMI - - 60.10  Systolic 932 88 -  Diastolic 52 56 -  Pulse 65 81 -    Physical Exam Constitutional:      General: She is not in acute distress.    Appearance: She is obese.  HENT:     Head: Atraumatic.  Neck:     Vascular: No carotid bruit or JVD.  Cardiovascular:     Rate and Rhythm: Normal rate and regular rhythm.     Pulses: Intact distal pulses.     Heart sounds: S1 normal and S2 normal. Murmur heard.    No gallop.     Comments: Right arm AV Fistula noted Pulmonary:     Effort: Pulmonary effort is normal.     Breath sounds: Normal breath sounds.  Abdominal:     General: Bowel sounds are normal.     Palpations: Abdomen is soft.     Tenderness: There is abdominal tenderness (right  hypogastrium).     Comments: Obese  Musculoskeletal:        General: Swelling (right arm) present.     Cervical back: Neck supple.  Skin:    General: Skin is warm.  Neurological:     General: No focal deficit present.     Mental Status: She is alert.    Lab Results: BMP BNP (last 3 results) Recent Labs    04/16/21 1052 08/06/21 1726  BNP 54.8 142.4*    ProBNP (last 3 results) No results for input(s): PROBNP in the last 8760 hours. BMP Latest Ref Rng & Units 08/14/2021 08/12/2021 08/11/2021  Glucose 70 - 99 mg/dL 102(H) 94 80  BUN 8 - 23 mg/dL 16 34(H) 23  Creatinine 0.44 - 1.00 mg/dL 4.41(H) 6.20(H) 4.66(H)  BUN/Creat Ratio 6 - 22 (calc) - - -  Sodium 135 - 145 mmol/L 135 133(L) 133(L)  Potassium 3.5 - 5.1 mmol/L 3.2(L) 3.4(L) 3.0(L)  Chloride 98 - 111 mmol/L 96(L) 97(L) 94(L)  CO2 22 - 32 mmol/L 27 20(L) 23  Calcium 8.9 - 10.3 mg/dL 9.5 8.9 9.1   Hepatic Function Latest Ref Rng & Units 08/13/2021 08/06/2021 05/29/2021  Total Protein 6.5 - 8.1 g/dL 4.7(L) 6.1(L) -  Albumin 3.5 - 5.0 g/dL 2.1(L) 2.4(L) 2.3(L)  AST 15 - 41 U/L 12(L) 19 -  ALT 0 - 44 U/L 13 13 -  Alk  Phosphatase 38 - 126 U/L 81 126 -  Total Bilirubin 0.3 - 1.2 mg/dL 0.8 0.7 -  Bilirubin, Direct 0.0 - 0.2 mg/dL <0.1 - -   CBC Latest Ref Rng & Units 08/14/2021 08/12/2021 08/11/2021  WBC 4.0 - 10.5 K/uL 13.8(H) 10.9(H) 10.0  Hemoglobin 12.0 - 15.0 g/dL 7.5(L) 8.0(L) 8.2(L)  Hematocrit 36.0 - 46.0 % 22.7(L) 24.4(L) 24.2(L)  Platelets 150 - 400 K/uL 211 152 144(L)   Lipid Panel     Component Value Date/Time   TRIG 194 (H) 12/25/2019 1157   Cardiac Panel (last 3 results) No results for input(s): CKTOTAL, CKMB, TROPONINI, RELINDX in the last 72 hours.  HEMOGLOBIN A1C Lab Results  Component Value Date   HGBA1C 6.2 (H) 08/07/2021   MPG 131.24 08/07/2021   TSH Recent Labs    08/07/21 0010  TSH 1.184   Imaging: CT Abdomen Pelvis Wo Contrast 05/10/2019: 1. Right percutaneous nephrostomy tube in place with  resolved right hydronephrosis.  2. Similar appearing bilateral nonobstructive nephrolithiasis.  3. Aortic atherosclerosis.   DG Chest 02/03/2020:  Cardiomegaly and mild pulmonary vascular congestion.   CT Chest High Resolution 07/24/2020: 1. Bland, bandlike scarring of the bilateral lung bases, consistent with post infectious or inflammatory scarring. No evidence of fibrotic interstitial lung disease. 2. Mild lobular air trapping on expiratory phase imaging, consistent with small airways disease. 3. There is a macroscopically fat attenuation nodule of the lingula measuring 1.5 x 1.3 cm, not significantly changed compared to prior examination. This is consistent with a benign pulmonary hamartoma. 4. Cardiomegaly and coronary artery disease. 5. Aortic valve calcifications. Aortic Atherosclerosis  Cardiac Studies:  Lexiscan myoview stress test 02/13/2018:  1. Pharmacologic stress testing was performed with intravenous administration of .4 mg of Lexiscan over a 10-15 seconds infusion. Resting hypertension 180/84 mmHg. Exercise capacity not assessed. Stress symptoms included dyspnea, dizziness. Stress EKG is non diagnostic for ischemia as it is a pharmacologic stress.  2. The overall quality of the study is excellent. There is no evidence of abnormal lung activity. Stress and rest SPECT images demonstrate homogeneous tracer distribution throughout the myocardium. Gated SPECT imaging reveals normal myocardial thickening and wall motion. The left ventricular ejection fraction was normal (67%).   3. Low risk study. Overall Impression:  Poor exercise capacity. Inadequate assessment for chronotropic incompetense, limited due to poor functional capacity.  Recoomendation: Consider Holter/event monitor if high clinical suspicion for high grade AV block . Continue primary/secondary prevention.   Carotid artery duplex 03/10/2018: Minimal stenosis in the right internal carotid artery (1-15%). Stenosis in  the left internal carotid artery (16-49%). Antegrade right vertebral artery flow. Antegrade left vertebral artery flow. Follow up in one year is appropriate if clinically indicated.   TEE 03/20/2018: - Left ventricle: There was moderate concentric hypertrophy with  severe basal septal hypertrophy- likely reason for LVOT gradient.  No SAM, no sub aortic membrane. Hyperdynamic LV> LVEF >70%. Wall  motion was normal; there were no regional wall motion abnormalities. - Aortic valve: Mild annular calcification. Normal leaflets with normal excursion. Mean PG 33 mmHg, Vmax 4 m/sec, however gradient arising throughout LV outflow tract without significant aortic valvular stenosis.  AVA by continuity equation at least 1.4 cm2. - Mitral valve: Mildly to moderately calcified annulus. Normal   thickness leaflets . There was mild regurgitation. - Left atrium: No evidence of thrombus in the atrial cavity or   appendage. Impressions:  - Hyperdynamic LV with severe basal septal hypertrophy and LVOT   obstruction. No significant  valvular stenosis.   Right heart cath 03/20/2018: RA pressure 3 mmhg RVSP 48 mmHg, RVEDP 6 mmhg PAP 42/9 mmhg, Mean Pap 20 mmhg CO 6.8 L/min, CI 3.3 L/min/m2 No pulmonary hypertension. Normal cardiac output   Vascular Ultrasound Lower Extremity Venous 06/09/2019: Right: There is no evidence of deep vein thrombosis in the lower extremity. However, portions of this examination were limited- see technologist comments above. No cystic structure found in the popliteal fossa.  Left: There is no evidence of deep vein thrombosis in the lower extremity. However, portions of this examination were limited- see technologist comments above. No cystic structure found in the popliteal fossa.    Echocardiogram 08/13/2021:   1. Left ventricular ejection fraction, by estimation, is >75%. The left ventricle has hyperdynamic function. The left ventricle has no regional wall motion abnormalities. There is  moderate left ventricular hypertrophy. Left ventricular diastolic  function could not be evaluated.  2. Right ventricular systolic function is normal. The right ventricular size is normal. There is moderately elevated pulmonary artery systolic pressure.  3. Left atrial size was severely dilated.  4. Right atrial size was severely dilated.  5. The mitral valve is normal in structure. Mild mitral valve regurgitation. No evidence of mitral stenosis.  6. Tricuspid valve regurgitation is moderate.  7. The aortic valve is tricuspid. There is mild calcification of the aortic valve. Aortic valve regurgitation is not visualized. Mild to moderate aortic valve stenosis. Aortic valve area, by VTI measures 1.18 cm. Aortic valve mean gradient measures  33.0 mmHg. Aortic valve Vmax measures 3.80 m/s.  8. The inferior vena cava is dilated in size with >50% respiratory variability, suggesting right atrial pressure of 8 mmHg. No significant change since study 04/17/2020.    Pacemaker    Remote dual-chamber pacemaker transmission 07/06/2021: Predominant rhythm is normal sinus rhythm.  There were 3 mode switches, = AT/AF, longest 5 minutes. There are rare brief NSVT episodes, 4-5 and number. Longevity 4.3 years.  AP 7.9 years, VP 99.7%.  Normal pacemaker function, no change from prior transmission.   EKG:   AV-Paced, His bundle pacing suspected. Ventricular rate 93/min    Scheduled Meds:  sodium chloride   Intravenous Once   acetaminophen  650 mg Oral Q6H   vitamin C  500 mg Oral BID   Chlorhexidine Gluconate Cloth  6 each Topical Q0600   Chlorhexidine Gluconate Cloth  6 each Topical Q0600   darbepoetin (ARANESP) injection - DIALYSIS  60 mcg Intravenous Q Mon-HD   escitalopram  5 mg Oral Daily   feeding supplement  237 mL Oral Q24H   ferric citrate  420 mg Oral TID WC   gentamicin cream  1 application Topical Daily   heparin  5,000 Units Subcutaneous Q8H   insulin aspart  0-6 Units Subcutaneous TID  WC   levothyroxine  75 mcg Oral Q0600   midodrine  15 mg Oral TID WC   pantoprazole  40 mg Oral BID   pseudoephedrine  30 mg Oral TID   saccharomyces boulardii  250 mg Oral BID   senna  1 tablet Oral QHS   simvastatin  40 mg Oral q1800   vitamin A  10,000 Units Oral Daily   zinc sulfate  220 mg Oral BID   Continuous Infusions:  sodium chloride Stopped (08/10/21 1039)   PRN Meds:.calcium carbonate, diclofenac Sodium, docusate sodium, ondansetron (ZOFRAN) IV, oxyCODONE, polyethylene glycol, prochlorperazine  Assessment/Plan:  SHANEKA EFAW is a 75 y.o. Caucasian female with hypertension, morbid obesity,  chronic hypotension and midodrine at home, CKD now on HD, type 2 DM, hyperlipidemia, s/p Medtronic dual chamber pacemaker for symptomatic high grade AV block, hypothyroidism, anemia, osteopenia, bilateral chronic lower extremity venous insufficiency. History of Covid-19 pneumonia 12/2019.    Admitted to the hospital with acute cholecystitis, underwent laparoscopic cholecystectomy.  Patient has been having some difficulty with physical therapy due to low blood pressure, I was asked to see the patient to evaluate for reversible causes of hypotension.   1.  Systemic hypotension 2.  Hyperdynamic LV with LVOT obstruction 3.  Mild aortic stenosis 4.  Acute cholecystitis SP laparoscopic cholecystectomy End-stage renal disease on hemodialysis, presently volume overloaded unable to do fluid mobilization with ultrafiltration due to low blood pressure.  Recommendation: I had increased her midodrine to 15 mg 3 times daily and also added pseudoephedrine 30 mg 3 times daily.  She was able to tolerate physical therapy today but is significantly deconditioned overall.  As she is tachycardic with pseudoephedrine, it may worsen LVOT obstruction and diastolic compliance.  Fortunately she is pacer dependent, I will reprogram the pacemaker to maximum upper limit heart rate of 80 bpm and see if her  hemodynamics would improve.  Pseudoephedrine to twice daily dosing.   Adrian Prows, M.D. 08/14/2021, 12:48 PM [facility] Pager: [phone] Office: [phone] If no answer: [phone]

## 2021-08-15 DIAGNOSIS — Q244 Congenital subaortic stenosis: Secondary | ICD-10-CM

## 2021-08-15 DIAGNOSIS — I9589 Other hypotension: Secondary | ICD-10-CM

## 2021-08-15 LAB — TYPE AND SCREEN
ABO/RH(D): A POS
Antibody Screen: NEGATIVE
Unit division: 0

## 2021-08-15 LAB — BPAM RBC
Blood Product Expiration Date: 202210082359
ISSUE DATE / TIME: 202209131439
Unit Type and Rh: 6200

## 2021-08-15 LAB — RENAL FUNCTION PANEL
Albumin: 2.4 g/dL — ABNORMAL LOW (ref 3.5–5.0)
Anion gap: 16 — ABNORMAL HIGH (ref 5–15)
BUN: 26 mg/dL — ABNORMAL HIGH (ref 8–23)
CO2: 22 mmol/L (ref 22–32)
Calcium: 9.4 mg/dL (ref 8.9–10.3)
Chloride: 96 mmol/L — ABNORMAL LOW (ref 98–111)
Creatinine, Ser: 5.5 mg/dL — ABNORMAL HIGH (ref 0.44–1.00)
GFR, Estimated: 8 mL/min — ABNORMAL LOW (ref >60)
Glucose, Bld: 95 mg/dL (ref 70–99)
Phosphorus: 3.1 mg/dL (ref 2.5–4.6)
Potassium: 3.9 mmol/L (ref 3.5–5.1)
Sodium: 134 mmol/L — ABNORMAL LOW (ref 135–145)

## 2021-08-15 LAB — GLUCOSE, CAPILLARY
Glucose-Capillary: 103 mg/dL — ABNORMAL HIGH (ref 70–99)
Glucose-Capillary: 91 mg/dL (ref 70–99)
Glucose-Capillary: 138 mg/dL — ABNORMAL HIGH (ref 70–99)
Glucose-Capillary: 136 mg/dL — ABNORMAL HIGH (ref 70–99)

## 2021-08-15 MED ORDER — ALBUMIN HUMAN 25 % IV SOLN
INTRAVENOUS | Status: AC
  Administered 2021-08-15: 25 g
  Filled 2021-08-15: qty 100

## 2021-08-15 MED ORDER — PSEUDOEPHEDRINE HCL 30 MG PO TABS
30.0000 mg | ORAL_TABLET | Freq: Two times a day (BID) | ORAL | Status: DC
  Administered 2021-08-15 – 2021-08-17 (×4): 30 mg via ORAL
  Filled 2021-08-15 (×6): qty 1

## 2021-08-15 NOTE — Progress Notes (Signed)
Subjective:  No specific complaints today except continues to be extremely weak.  States that her feet feels like a rubber and she has not been able to bear weight.  Previously was walking with the help of walker at home.  Since that time she passed out and has not following episodes prior to the hospital admission, patient states that she has not been able to get up and walk.  Intake/Output from previous day:  I/O last 3 completed shifts: In: 1191.8 [P.O.:837; I.V.:32.3; Blood:322.5] Out: -  No intake/output data recorded.  Blood pressure (!) 107/59, pulse 69, temperature 98.3 F (36.8 C), temperature source Oral, resp. rate 15, height _0  (1.651 m), weight 89.3 kg, SpO2 99 %.  Vitals with BMI 08/15/2021 08/15/2021 08/15/2021  Height - - -  Weight - 201 lbs 12 oz -  BMI - 03.47 -  Systolic 425 94 956  Diastolic 58 51 59  Pulse 70 69 69    Physical Exam Constitutional:      General: She is not in acute distress.    Appearance: She is obese.     Comments: Presently seeing during hemodialysis.  HENT:     Head: Atraumatic.  Neck:     Vascular: No carotid bruit or JVD.  Cardiovascular:     Rate and Rhythm: Normal rate and regular rhythm.     Pulses: Intact distal pulses.     Heart sounds: S1 normal and S2 normal. Murmur heard.    No gallop.     Comments: Right arm AV Fistula noted Pulmonary:     Effort: Pulmonary effort is normal.     Breath sounds: Normal breath sounds.  Abdominal:     General: Bowel sounds are normal.     Palpations: Abdomen is soft.     Tenderness: There is abdominal tenderness (right hypogastrium).     Comments: Obese  Musculoskeletal:        General: Swelling (right arm) present.     Cervical back: Neck supple.  Skin:    General: Skin is warm.  Neurological:     General: No focal deficit present.     Mental Status: She is alert.  No significant change in physical examination compared to yesterday.  Lab Results: BMP BNP (last 3 results) Recent  Labs    04/16/21 1052 08/06/21 1726  BNP 54.8 142.4*    ProBNP (last 3 results) No results for input(s): PROBNP in the last 8760 hours. BMP Latest Ref Rng & Units 08/15/2021 08/14/2021 08/12/2021  Glucose 70 - 99 mg/dL 95 102(H) 94  BUN 8 - 23 mg/dL 26(H) 16 34(H)  Creatinine 0.44 - 1.00 mg/dL 5.50(H) 4.41(H) 6.20(H)  BUN/Creat Ratio 6 - 22 (calc) - - -  Sodium 135 - 145 mmol/L 134(L) 135 133(L)  Potassium 3.5 - 5.1 mmol/L 3.9 3.2(L) 3.4(L)  Chloride 98 - 111 mmol/L 96(L) 96(L) 97(L)  CO2 22 - 32 mmol/L 22 27 20(L)  Calcium 8.9 - 10.3 mg/dL 9.4 9.5 8.9   Hepatic Function Latest Ref Rng & Units 08/15/2021 08/13/2021 08/06/2021  Total Protein 6.5 - 8.1 g/dL - 4.7(L) 6.1(L)  Albumin 3.5 - 5.0 g/dL 2.4(L) 2.1(L) 2.4(L)  AST 15 - 41 U/L - 12(L) 19  ALT 0 - 44 U/L - 13 13  Alk Phosphatase 38 - 126 U/L - 81 126  Total Bilirubin 0.3 - 1.2 mg/dL - 0.8 0.7  Bilirubin, Direct 0.0 - 0.2 mg/dL - <0.1 -   CBC Latest Ref Rng & Units  08/14/2021 08/12/2021 08/11/2021  WBC 4.0 - 10.5 K/uL 13.8(H) 10.9(H) 10.0  Hemoglobin 12.0 - 15.0 g/dL 7.5(L) 8.0(L) 8.2(L)  Hematocrit 36.0 - 46.0 % 22.7(L) 24.4(L) 24.2(L)  Platelets 150 - 400 K/uL 211 152 144(L)   Lipid Panel     Component Value Date/Time   TRIG 194 (H) 12/25/2019 1157   Cardiac Panel (last 3 results) No results for input(s): CKTOTAL, CKMB, TROPONINI, RELINDX in the last 72 hours.  HEMOGLOBIN A1C Lab Results  Component Value Date   HGBA1C 6.2 (H) 08/07/2021   MPG 131.24 08/07/2021   TSH Recent Labs    08/07/21 0010  TSH 1.184    Radiology:    CT Abdomen Pelvis Wo Contrast 05/10/2019: 1. Right percutaneous nephrostomy tube in place with resolved right hydronephrosis.  2. Similar appearing bilateral nonobstructive nephrolithiasis.  3. Aortic atherosclerosis.   DG Chest 02/03/2020:  Cardiomegaly and mild pulmonary vascular congestion.   CT Chest High Resolution 07/24/2020: 1. Bland, bandlike scarring of the bilateral lung bases,  consistent with post infectious or inflammatory scarring. No evidence of fibrotic interstitial lung disease. 2. Mild lobular air trapping on expiratory phase imaging, consistent with small airways disease. 3. There is a macroscopically fat attenuation nodule of the lingula measuring 1.5 x 1.3 cm, not significantly changed compared to prior examination. This is consistent with a benign pulmonary hamartoma. 4. Cardiomegaly and coronary artery disease. 5. Aortic valve calcifications. Aortic Atherosclerosis   Cardiac Studies:    Lexiscan myoview stress test 02/13/2018:  1. Pharmacologic stress testing was performed with intravenous administration of .4 mg of Lexiscan over a 10-15 seconds infusion. Resting hypertension 180/84 mmHg. Exercise capacity not assessed. Stress symptoms included dyspnea, dizziness. Stress EKG is non diagnostic for ischemia as it is a pharmacologic stress.  2. The overall quality of the study is excellent. There is no evidence of abnormal lung activity. Stress and rest SPECT images demonstrate homogeneous tracer distribution throughout the myocardium. Gated SPECT imaging reveals normal myocardial thickening and wall motion. The left ventricular ejection fraction was normal (67%).   3. Low risk study. Overall Impression:  Poor exercise capacity. Inadequate assessment for chronotropic incompetense, limited due to poor functional capacity.  Recoomendation: Consider Holter/event monitor if high clinical suspicion for high grade AV block . Continue primary/secondary prevention.   Carotid artery duplex 03/10/2018: Minimal stenosis in the right internal carotid artery (1-15%). Stenosis in the left internal carotid artery (16-49%). Antegrade right vertebral artery flow. Antegrade left vertebral artery flow. Follow up in one year is appropriate if clinically indicated.   TEE 03/20/2018: - Left ventricle: There was moderate concentric hypertrophy with  severe basal septal hypertrophy-  likely reason for LVOT gradient.  No SAM, no sub aortic membrane. Hyperdynamic LV> LVEF >70%. Wall  motion was normal; there were no regional wall motion abnormalities. - Aortic valve: Mild annular calcification. Normal leaflets with normal excursion. Mean PG 33 mmHg, Vmax 4 m/sec, however gradient arising throughout LV outflow tract without significant aortic valvular stenosis.  AVA by continuity equation at least 1.4 cm2. - Mitral valve: Mildly to moderately calcified annulus. Normal   thickness leaflets . There was mild regurgitation. - Left atrium: No evidence of thrombus in the atrial cavity or   appendage. Impressions:  - Hyperdynamic LV with severe basal septal hypertrophy and LVOT   obstruction. No significant valvular stenosis.   Right heart cath 03/20/2018: RA pressure 3 mmhg RVSP 48 mmHg, RVEDP 6 mmhg PAP 42/9 mmhg, Mean Pap 20 mmhg CO 6.8 L/min, CI  3.3 L/min/m2 No pulmonary hypertension. Normal cardiac output   Vascular Ultrasound Lower Extremity Venous 06/09/2019: Right: There is no evidence of deep vein thrombosis in the lower extremity. However, portions of this examination were limited- see technologist comments above. No cystic structure found in the popliteal fossa.  Left: There is no evidence of deep vein thrombosis in the lower extremity. However, portions of this examination were limited- see technologist comments above. No cystic structure found in the popliteal fossa.    Echocardiogram 04/17/2020:  Hyperdynamic LV systolic function with visual EF >70%. Intraventricular PG and mild LVOT obstruction noted with a peak PG of 37 mm Hg.  Left  ventricle cavity is normal in size. Moderate concentric remodeling of the left ventricle. Normal global wall motion.  Doppler evidence of grade I (impaired) diastolic dysfunction, normal LAP. Frequent PVC or paced rhythm noted.  Right ventricle cavity is normal in size. Normal right ventricular function. Pacemaker lead/ICD lead noted in  the RV.  Trileaflet aortic valve. No evidence of aortic stenosis. Trace aortic regurgitation.  Mild aortic valve leaflet thickening. Aortic valve peak  pressure gradient of 37 and mean gradient of 20.6 mmHg, calculated aortic valve area 1.5 cm.  Howevere the PG is related to intraventricular LV pressure gradient. AVA (VTI) measures 1.5 cm^2.  AV Mean Grad measures 20.6 mmHg. AV Pk Vel measures 3.04 m/s.  Mild calcification of the mitral valve annulus. Mild mitral valve leaflet calcification. Mildly restricted mitral valve leaflets.  No evidence of mitral stenosis. Mild (Grade I) mitral regurgitation.  Structurally normal tricuspid valve.  Mild tricuspid regurgitation. No evidence of pulmonary hypertension.  No evidence of significant pericardial effusion. Anterior fat pad noted.     Pacemaker  In person pacemaker check 08/14/2021 Single (S)/Dual (D)/BV: D. Presenting ASVP. Pacemaker dependant:  Yes. Underlying CHB. AP <10%, VP 100%.   No significant mode switch episodes or HVR episodes (Brief VT) Longevity 4.3 Years. Magnet rate: >85%. Lead measurements: Stable.   Observations: Normal pacemaker function. Changes: Reduced upper tracking rate to 80 beats per min.   Scheduled Remote pacemaker check 03/09/2020:  There was a 0 % cumulative atrial arrhythmia burden. Battery longevity is 6.2 years. RA pacing is 3.7 %, RV pacing is 99.7 %.   EKG    EKG 11/06/2020: Probably AV paced rhythm. No significant change from 03/31/2020.  Telemetry 08/15/2021: Underlying sinus rhythm with ventricularly paced rhythm.  Ventricular rate 78 bpm.  No further analysis.  Scheduled Meds:  acetaminophen  650 mg Oral Q6H   vitamin C  500 mg Oral BID   Chlorhexidine Gluconate Cloth  6 each Topical Q0600   Chlorhexidine Gluconate Cloth  6 each Topical Q0600   Chlorhexidine Gluconate Cloth  6 each Topical Q0600   darbepoetin (ARANESP) injection - DIALYSIS  60 mcg Intravenous Q Mon-HD   escitalopram  5 mg Oral  Daily   feeding supplement  237 mL Oral Q24H   ferric citrate  420 mg Oral TID WC   gentamicin cream  1 application Topical Daily   heparin  5,000 Units Subcutaneous Q8H   insulin aspart  0-6 Units Subcutaneous TID WC   levothyroxine  75 mcg Oral Q0600   midodrine  15 mg Oral TID WC   pantoprazole  40 mg Oral BID   pseudoephedrine  30 mg Oral BID   saccharomyces boulardii  250 mg Oral BID   senna  1 tablet Oral QHS   simvastatin  40 mg Oral q1800   vitamin A  10,000 Units Oral Daily   zinc sulfate  220 mg Oral BID   Continuous Infusions:  sodium chloride Stopped (08/10/21 1039)   PRN Meds:.calcium carbonate, diclofenac Sodium, docusate sodium, ondansetron (ZOFRAN) IV, oxyCODONE, polyethylene glycol, prochlorperazine  Assessment/Plan:  Chronic hypotension Chronic diastolic heart failure. LVOT obstruction and mild aortic stenosis with hyperdynamic LVEF.  Recommendation: I have reprogrammed her pacemaker to upper tracking limit of 80 bpm to see whether inducing bradycardia or reducing her heart rate would improve her LVOT obstruction and decrease pressure gradient across the aortic valve.  We will do orthostatics 3 times daily for the next 2 days.  Continue midodrine at 50 mg 3 times daily and will change pseudoephedrine to 30 mg in the morning and in the afternoon and discontinue evening dose.  Hopefully this will help with improvement in chronic hypotension.  Patient is significantly deconditioned.  Continue physical therapy.    Adrian Prows, MD, [facility] 08/15/2021, 8:45 AM Office: [phone] Fax: [phone] Pager: [phone]

## 2021-08-15 NOTE — Progress Notes (Signed)
Physical Therapy Treatment Patient Details Name: Erika Fry MRN: [id number] DOB: June 03, 1946 Today's Date: 08/15/2021   History of Present Illness 75 y.o. female presents to [facility] ED on 08/06/2021 with weakness and dizziness, recently running out of midodrine. Pt with syncopal episode the day prior to admission. Systolic BPs in 01S in ED. CT abdomen demonstrates gallbladder stone with underlying sludge. Pt admitted for septic shock and acute cholecystitis. She is s/p lap chole on 08/09/21 and her pacemaker rate was adjusted on 08/14/21.  Continues to have issues with soft BP and orthostatic hypotension. PMH includes ESRD on peritoneal dialysis, AV block with pacemaker, diabetes, GERD, hyperlipidemia, hypothyroidism, arthritis.    PT Comments    Pt motivated but remains limited by orthostatic hypotension.  She was able to ambulate 10' and then 32' with close chair follow.  Her symptoms are better (but still present) with walking compared to static stand.   Requiring min-mod A for transfers.  She wears compression stockings at home that she feels helps with her BP - notified RN.  Also, pt was unable to get the SNF bed that she wanted and refuses others.  If she goes home she will need assist/supervision with all OOB activity.    Orthostatic BPs  Supine 90/47 with MAP 60  Sitting 90/49 with MAP 63  Unable to tolerate static stand, took with walk 74/30 with MAP 39  Returned to sitting, took after 1 min 94/56 with MAP 69  Immediately sitting after 2nd walk 100/47     Recommendations for follow up therapy are one component of a multi-disciplinary discharge planning process, led by the attending physician.  Recommendations may be updated based on patient status, additional functional criteria and insurance authorization.  Follow Up Recommendations  Other (comment);SNF (Pt unable to get SNF bed that she wants so refuses other SNF.  If home will need family assist with ALL OOB activities and HH  therapies)     Equipment Recommendations  None recommended by PT    Recommendations for Other Services       Precautions / Restrictions Precautions Precautions: Fall Precaution Comments: orthostatic BP     Mobility  Bed Mobility Overal bed mobility: Needs Assistance Bed Mobility: Rolling;Sidelying to Sit Rolling: Supervision Sidelying to sit: HOB elevated;Min assist       General bed mobility comments: Light min A for a hand to pull up on to sit    Transfers Overall transfer level: Needs assistance Equipment used: Rolling walker (2 wheeled) Transfers: Sit to/from Stand Sit to Stand: Min assist;Mod assist         General transfer comment: Min A from elevated bed and where she had surface to push on; mod A from straight back chair no armrest.  Use of momentum.  Ambulation/Gait Ambulation/Gait assistance: Min assist Gait Distance (Feet): 15 Feet (10' then 15') Assistive device: Rolling walker (2 wheeled) Gait Pattern/deviations: Step-to pattern;Trunk flexed;Shuffle Gait velocity: reduced   General Gait Details: Pt with slow steps with chair close by due to decreasing BP.  Ambulated to door, took seated rest break, then able to ambulate further to recliner.  Had pt talk to therapist while up and moving to monitor syncopal symptoms.  Lightheadedness was better with moving than static stand.   Stairs             Wheelchair Mobility    Modified Rankin (Stroke Patients Only)       Balance Overall balance assessment: Needs assistance Sitting-balance support: No upper extremity supported;Feet  supported Sitting balance-Leahy Scale: Good     Standing balance support: Bilateral upper extremity supported Standing balance-Leahy Scale: Poor Standing balance comment: reliant on UE support of walker                            Cognition Arousal/Alertness: Awake/alert Behavior During Therapy: WFL for tasks assessed/performed Overall Cognitive  Status: Within Functional Limits for tasks assessed                                        Exercises General Exercises - Lower Extremity Ankle Circles/Pumps: AROM;Both;20 reps;Seated Quad Sets: AROM;Both;Seated;10 reps Gluteal Sets: AROM;Both;Seated;10 reps Long Arc Quad: AROM;Both;10 reps;Seated Hip Flexion/Marching: AROM;Both;10 reps;Seated    General Comments General comments (skin integrity, edema, etc.): Asked pt about compression stockings and she reports wears at home and seems to help with BP - notified RN via secure chat.      Pertinent Vitals/Pain Pain Assessment: No/denies pain    Home Living                      Prior Function            PT Goals (current goals can now be found in the care plan section) Progress towards PT goals: Progressing toward goals    Frequency    Min 3X/week      PT Plan Current plan remains appropriate    Co-evaluation              AM-PAC PT "6 Clicks" Mobility   Outcome Measure  Help needed turning from your back to your side while in a flat bed without using bedrails?: A Little Help needed moving from lying on your back to sitting on the side of a flat bed without using bedrails?: A Little Help needed moving to and from a bed to a chair (including a wheelchair)?: A Little Help needed standing up from a chair using your arms (e.g., wheelchair or bedside chair)?: A Little Help needed to walk in hospital room?: A Little Help needed climbing 3-5 steps with a railing? : A Lot 6 Click Score: 17    End of Session Equipment Utilized During Treatment: Gait belt Activity Tolerance: Other (comment) (limited by hypotension) Patient left: in chair;with call bell/phone within reach Nurse Communication: Mobility status PT Visit Diagnosis: Other abnormalities of gait and mobility (R26.89);Muscle weakness (generalized) (M62.81)     Time: 0272-5366 PT Time Calculation (min) (ACUTE ONLY): 40  min  Charges:  $Gait Training: 8-22 mins $Therapeutic Exercise: 8-22 mins $Therapeutic Activity: 8-22 mins                     Abran Richard, PT Acute Rehab Services Pager [phone] [facility] 08/15/2021, 5:06 PM

## 2021-08-15 NOTE — Progress Notes (Signed)
[facility] Progress Note  Subjective: Seen and examined on dialysis.  Procedure supervised.  Blood pressure 100/60.  2.5 kg goal set currently. (2kg net)  right AVF in use.  Tolerating goal at this time.  She didn't get midodrine pre-HD and we are giving now.  She got albumin with tx start and will get another dose mid tx per my orders.    Review of systems:  N/v a couple of days ago both resolved Denies chest pain  Denies shortness of breath reports dizziness with exertion  Vitals:   08/15/21 0050 08/15/21 0500 08/15/21 0621 08/15/21 0810  BP: (!) 110/58  (!) 107/59   Pulse: 74 74 69   Resp: 17 18 15    Temp: 97.8 F (36.6 C)  97.9 F (36.6 C) 98.3 F (36.8 C)  TempSrc: Oral  Oral Oral  SpO2: 96%  99%   Weight:  89.3 kg    Height:        Exam:  General adult female in bed in no acute distress  HEENT normocephalic atraumatic extraocular movements intact sclera anicteric Neck supple trachea midline Lungs clear to auscultation bilaterally normal work of breathing at rest room air Heart S1S2 no rub Abdomen soft nondistended Extremities trace to 1+ edema lower extremities and edema in upper extremities 1-2+ Psych normal mood and affect Neuro - alert and oriented x 3 provides hx and follows commands  Access: RUE AVF in use PD catheter in place   Dialysis: CCPD 7x/week, [facility]   6 exchanges, dwell 1.5hrs, fill vol 2800cc, 80kg dry wt    Assessment/Plan: Cholecysitis - +HIDA scan showed non-visualized GB. US showed sludge and stones. SP lap chole on 9/08, IV abx per primary team  Syncopal episode/Dizziness/Hypotension- sp 3 L bolus in ED, getting midodrine here and BP's better. Off pressors.  ESRD - on CPPD at home. Now on temporary HD while waiting for surgical wounds to heal, 2 wks.  Has R arm AVF which we are using for HD HD per MWF schedule - albumin today  Chronic hypotension, on midodrine 5 bid at home per charting which was increased to 10 mg tid here.   Volume overload. stood to weight on 9/10 up and was 11kg up.  On room air, no resp issues. BP limits UF. May need serial HD but staffing makes this difficult.  Noted midodrine has now been increased to 15 mg TID.  She will be at [facility] post discharge getting HD four times a week which may help Anemia of CKD - started ESA 60 mcg on 9/12. PRBC's on 9/13  Secondary Hyperparathyroidism - on auryxia. monitor trends. Phos in am      Recent Labs  Lab 08/11/21 0416 08/11/21 0830 08/12/21 1018 08/14/21 0634 08/15/21 0547  K 3.0*  --  3.4* 3.2* 3.9  BUN 23  --  34* 16 26*  CREATININE 4.66*  --  6.20* 4.41* 5.50*  CALCIUM 9.1  --  8.9 9.5 9.4  PHOS 4.2  --   --   --  3.1  HGB  --    < > 8.0* 7.5*  --    < > = values in this interval not displayed.   Inpatient medications:  acetaminophen  650 mg Oral Q6H   vitamin C  500 mg Oral BID   Chlorhexidine Gluconate Cloth  6 each Topical Q0600   Chlorhexidine Gluconate Cloth  6 each Topical Q0600   Chlorhexidine Gluconate Cloth  6 each Topical Q0600  darbepoetin (ARANESP) injection - DIALYSIS  60 mcg Intravenous Q Mon-HD   escitalopram  5 mg Oral Daily   feeding supplement  237 mL Oral Q24H   ferric citrate  420 mg Oral TID WC   gentamicin cream  1 application Topical Daily   heparin  5,000 Units Subcutaneous Q8H   insulin aspart  0-6 Units Subcutaneous TID WC   levothyroxine  75 mcg Oral Q0600   midodrine  15 mg Oral TID WC   pantoprazole  40 mg Oral BID   pseudoephedrine  30 mg Oral TID   saccharomyces boulardii  250 mg Oral BID   senna  1 tablet Oral QHS   simvastatin  40 mg Oral q1800   vitamin A  10,000 Units Oral Daily   zinc sulfate  220 mg Oral BID    sodium chloride Stopped (08/10/21 1039)   calcium carbonate, diclofenac Sodium, docusate sodium, ondansetron (ZOFRAN) IV, oxyCODONE, polyethylene glycol, prochlorperazine   Claudia Desanctis, MD 08/15/2021 8:43 AM

## 2021-08-15 NOTE — Progress Notes (Signed)
PROGRESS NOTE    Erika Fry  GGY:[id number] DOB: December 25, 1945 DOA: 08/06/2021 PCP: [name], MD   Brief Narrative: 75 year old with past medical history significant for ESRD on peritoneal dialysis, AV block with pacemaker, diabetes, GERD, hyperlipidemia, hypothyroidism, arthritis who presented with generalized weakness, dizziness and abdominal pain.  She apparently ran out of midodrine before this hospitalization.  She was subsequently found to have septic shock due to acute cholecystitis.  She was admitted to the ICU-started on pressors-and underwent cholecystectomy on 9/8.  She was subsequently transferred to the [facility] on 9/8.  Subjective: Seen earlier this morning at hemodialysis-no complaints  Objective: Vitals: Blood pressure (!) 93/53, pulse 71, temperature 97.7 F (36.5 C), temperature source Oral, resp. rate 14, height 5\' 5"  (1.651 m), weight 89.5 kg, SpO2 97 %.   Exam: Gen Exam:Alert awake-not in any distress HEENT:atraumatic, normocephalic Chest: B/L clear to auscultation anteriorly CVS:S1S2 regular Abdomen:soft non tender, non distended Extremities:no edema Neurology: Non focal Skin: no rash   Pertinent labs/radiology Na: 134 Hb on 9/13: 7.5  Assessment & Plan: Septic shock due to acute cholecystitis: Sepsis physiology has resolved-underwent cholecystectomy on 9/8.  No longer on Zosyn.  Hypotension: Chronic issue-on midodrine and pseudoephedrine.  Cardiology has adjusted PPM settings to see if reducing her heart rate would improve her chronic LVOT obstruction.  Will await further recommendations from cardiology.  ESRD: On CPPD at home-transition to temporary HD for 2 weeks given abdominal surgery.  Normocytic anemia: S/p 1 unit of PRBC on 9/13-defer ESA to nephrology.  Repeat CBC in AM.  Hypothyroidism: Continue Synthroid  Chronic pain syndrome/leg pain: Continue oxycodone  History of complete heart block-s/p PPM  placement  History of mild aortic stenosis-moderate LVOT obstruction by echo  Chronic bilateral lower extremity venous insufficiency  8 mm nodular density in left lung: Seen incidentally on CT abdomen on 9/5-stable for outpatient follow-up by PCP  Pressure injury: Pressure Injury 08/07/21 Buttocks Left Deep Tissue Pressure Injury - Purple or maroon localized area of discolored intact skin or blood-filled blister due to damage of underlying soft tissue from pressure and/or shear. (Active)  08/07/21 0100  Location: Buttocks  Location Orientation: Left  Staging: Deep Tissue Pressure Injury - Purple or maroon localized area of discolored intact skin or blood-filled blister due to damage of underlying soft tissue from pressure and/or shear.  Wound Description (Comments):   Present on Admission: Yes   Estimated body mass index is 32.83 kg/m as calculated from the following:   Height as of this encounter: 5\' 5"  (1.651 m).   Weight as of this encounter: 89.5 kg.   DVT prophylaxis: Heparin Code Status: Full code Family Communication:  Disposition Plan:  Status is: Inpatient  Remains inpatient appropriate because:IV treatments appropriate due to intensity of illness or inability to take PO  Dispo: The patient is from: Home              Anticipated d/c is to:  SNF              Patient currently is not medically stable to d/c. Needs serial HD, awaiting stabilization of BP.    Difficult to place patient No  Consultants:  General surgery CCM  Cards Renal  Procedures:  Cholecystectomy  9/8   Data Reviewed: I have personally reviewed following labs and imaging studies  CBC: Recent Labs  Lab 08/09/21 0322 08/10/21 0401 08/11/21 0830 08/12/21 1018 08/14/21 0634  WBC 12.0* 14.8* 10.0 10.9* 13.8*  HGB 8.4* 8.4* 8.2*  8.0* 7.5*  HCT 24.7* 24.4* 24.2* 24.4* 22.7*  MCV 93.9 94.9 96.4 96.8 98.3  PLT 159 144* 144* 152 716    Basic Metabolic Panel: Recent Labs  Lab  08/09/21 0322 08/10/21 0401 08/11/21 0416 08/12/21 1018 08/14/21 0634 08/15/21 0547  NA 129* 130* 133* 133* 135 134*  K 3.2* 3.6 3.0* 3.4* 3.2* 3.9  CL 96* 96* 94* 97* 96* 96*  CO2 17* 17* 23 20* 27 22  GLUCOSE 246* 105* 80 94 102* 95  BUN 41* 45* 23 34* 16 26*  CREATININE 7.14* 8.14* 4.66* 6.20* 4.41* 5.50*  CALCIUM 9.5 9.2 9.1 8.9 9.5 9.4  MG 1.9  --   --   --   --   --   PHOS 5.3*  --  4.2  --   --  3.1    GFR: Estimated Creatinine Clearance: 9.5 mL/min (A) (by C-G formula based on SCr of 5.5 mg/dL (H)). Liver Function Tests: Recent Labs  Lab 08/13/21 0059 08/15/21 0547  AST 12*  --   ALT 13  --   ALKPHOS 81  --   BILITOT 0.8  --   PROT 4.7*  --   ALBUMIN 2.1* 2.4*    No results for input(s): LIPASE, AMYLASE in the last 168 hours.  No results for input(s): AMMONIA in the last 168 hours. Coagulation Profile: No results for input(s): INR, PROTIME in the last 168 hours.  Cardiac Enzymes: No results for input(s): CKTOTAL, CKMB, CKMBINDEX, TROPONINI in the last 168 hours. BNP (last 3 results) No results for input(s): PROBNP in the last 8760 hours. HbA1C: No results for input(s): HGBA1C in the last 72 hours.  CBG: Recent Labs  Lab 08/14/21 1209 08/14/21 1654 08/14/21 2031 08/15/21 0642 08/15/21 1243  GLUCAP 114* 171* 155* 103* 91    Lipid Profile: No results for input(s): CHOL, HDL, LDLCALC, TRIG, CHOLHDL, LDLDIRECT in the last 72 hours. Thyroid Function Tests: No results for input(s): TSH, T4TOTAL, FREET4, T3FREE, THYROIDAB in the last 72 hours.  Anemia Panel: No results for input(s): VITAMINB12, FOLATE, FERRITIN, TIBC, IRON, RETICCTPCT in the last 72 hours. Sepsis Labs: Recent Labs  Lab 08/12/21 0748 08/12/21 1018  LATICACIDVEN 1.2 1.6     Recent Results (from the past 240 hour(s))  Resp Panel by RT-PCR (Flu A&B, Covid) Nasopharyngeal Swab     Status: None   Collection Time: 08/06/21  5:25 PM   Specimen: Nasopharyngeal Swab;  Nasopharyngeal(NP) swabs in vial transport medium  Result Value Ref Range Status   SARS Coronavirus 2 by RT PCR NEGATIVE NEGATIVE Final    Comment: (NOTE) SARS-CoV-2 target nucleic acids are NOT DETECTED.  The SARS-CoV-2 RNA is generally detectable in upper respiratory specimens during the acute phase of infection. The lowest concentration of SARS-CoV-2 viral copies this assay can detect is 138 copies/mL. A negative result does not preclude SARS-Cov-2 infection and should not be used as the sole basis for treatment or other patient management decisions. A negative result may occur with  improper specimen collection/handling, submission of specimen other than nasopharyngeal swab, presence of viral mutation(s) within the areas targeted by this assay, and inadequate number of viral copies(<138 copies/mL). A negative result must be combined with clinical observations, patient history, and epidemiological information. The expected result is Negative.  Fact Sheet for Patients:  EntrepreneurPulse.com.au  Fact Sheet for Healthcare Providers:  IncredibleEmployment.be  This test is no t yet approved or cleared by the [place] FDA and  has been authorized for detection  and/or diagnosis of SARS-CoV-2 by FDA under an Emergency Use Authorization (EUA). This EUA will remain  in effect (meaning this test can be used) for the duration of the COVID-19 declaration under Section 564(b)(1) of the Act, 21 U.S.C.section 360bbb-3(b)(1), unless the authorization is terminated  or revoked sooner.       Influenza A by PCR NEGATIVE NEGATIVE Final   Influenza B by PCR NEGATIVE NEGATIVE Final    Comment: (NOTE) The Xpert Xpress SARS-CoV-2/FLU/RSV plus assay is intended as an aid in the diagnosis of influenza from Nasopharyngeal swab specimens and should not be used as a sole basis for treatment. Nasal washings and aspirates are unacceptable for Xpert Xpress  SARS-CoV-2/FLU/RSV testing.  Fact Sheet for Patients: EntrepreneurPulse.com.au  Fact Sheet for Healthcare Providers: IncredibleEmployment.be  This test is not yet approved or cleared by the [place] FDA and has been authorized for detection and/or diagnosis of SARS-CoV-2 by FDA under an Emergency Use Authorization (EUA). This EUA will remain in effect (meaning this test can be used) for the duration of the COVID-19 declaration under Section 564(b)(1) of the Act, 21 U.S.C. section 360bbb-3(b)(1), unless the authorization is terminated or revoked.  Performed at [facility], [place] [address], [place], [place] [address]   Blood Culture (routine x 2)     Status: None   Collection Time: 08/06/21  5:25 PM   Specimen: BLOOD RIGHT FOREARM  Result Value Ref Range Status   Specimen Description BLOOD RIGHT FOREARM  Final   Special Requests   Final    BOTTLES DRAWN AEROBIC AND ANAEROBIC Blood Culture results may not be optimal due to an inadequate volume of blood received in culture bottles   Culture   Final    NO GROWTH 5 DAYS Performed at [facility], [place] [address], [place], [place] [address]    Report Status 08/11/2021 FINAL  Final  Blood Culture (routine x 2)     Status: None   Collection Time: 08/06/21  5:30 PM   Specimen: BLOOD RIGHT FOREARM  Result Value Ref Range Status   Specimen Description BLOOD RIGHT FOREARM  Final   Special Requests   Final    BOTTLES DRAWN AEROBIC AND ANAEROBIC Blood Culture results may not be optimal due to an inadequate volume of blood received in culture bottles   Culture   Final    NO GROWTH 5 DAYS Performed at [facility], [place] [address] [place], [place] [address]    Report Status 08/11/2021 FINAL  Final  MRSA Next Gen by PCR, Nasal     Status: None   Collection Time: 08/07/21 12:54 AM   Specimen: Nasal Mucosa; Nasal Swab  Result Value Ref Range Status   MRSA by PCR Next Gen NOT  DETECTED NOT DETECTED Final    Comment: (NOTE) The GeneXpert MRSA Assay (FDA approved for NASAL specimens only), is one component of a comprehensive MRSA colonization surveillance program. It is not intended to diagnose MRSA infection nor to guide or monitor treatment for MRSA infections. Test performance is not FDA approved in patients less than 4 years old. Performed at [facility], [place] [address] [place], [place] [address]           Radiology Studies: No results found.      Scheduled Meds:  acetaminophen  650 mg Oral Q6H   vitamin C  500 mg Oral BID   Chlorhexidine Gluconate Cloth  6 each Topical Q0600   Chlorhexidine Gluconate  Cloth  6 each Topical Q0600   Chlorhexidine Gluconate Cloth  6 each Topical Q0600   darbepoetin (ARANESP) injection - DIALYSIS  60 mcg Intravenous Q Mon-HD   escitalopram  5 mg Oral Daily   feeding supplement  237 mL Oral Q24H   ferric citrate  420 mg Oral TID WC   gentamicin cream  1 application Topical Daily   heparin  5,000 Units Subcutaneous Q8H   insulin aspart  0-6 Units Subcutaneous TID WC   levothyroxine  75 mcg Oral Q0600   midodrine  15 mg Oral TID WC   pantoprazole  40 mg Oral BID   pseudoephedrine  30 mg Oral BID   saccharomyces boulardii  250 mg Oral BID   senna  1 tablet Oral QHS   simvastatin  40 mg Oral q1800   vitamin A  10,000 Units Oral Daily   zinc sulfate  220 mg Oral BID   Continuous Infusions:  sodium chloride Stopped (08/10/21 1039)     LOS: 9 days    Time spent: 25 minutes.     Oren Binet, MD [facility]   If 7PM-7AM, please contact night-coverage [email or web address]  08/15/2021, 3:43 PM

## 2021-08-16 LAB — RENAL FUNCTION PANEL
Albumin: 3 g/dL — ABNORMAL LOW (ref 3.5–5.0)
Anion gap: 7 (ref 5–15)
BUN: 11 mg/dL (ref 8–23)
CO2: 31 mmol/L (ref 22–32)
Calcium: 9.3 mg/dL (ref 8.9–10.3)
Chloride: 100 mmol/L (ref 98–111)
Creatinine, Ser: 3.68 mg/dL — ABNORMAL HIGH (ref 0.44–1.00)
GFR, Estimated: 12 mL/min — ABNORMAL LOW (ref >60)
Glucose, Bld: 96 mg/dL (ref 70–99)
Phosphorus: 2 mg/dL — ABNORMAL LOW (ref 2.5–4.6)
Potassium: 4.3 mmol/L (ref 3.5–5.1)
Sodium: 138 mmol/L (ref 135–145)

## 2021-08-16 LAB — CBC
HCT: 27.5 % — ABNORMAL LOW (ref 36.0–46.0)
Hemoglobin: 9 g/dL — ABNORMAL LOW (ref 12.0–15.0)
MCH: 32.4 pg (ref 26.0–34.0)
MCHC: 32.7 g/dL (ref 30.0–36.0)
MCV: 98.9 fL (ref 80.0–100.0)
Platelets: 208 10*3/uL (ref 150–400)
RBC: 2.78 MIL/uL — ABNORMAL LOW (ref 3.87–5.11)
RDW: 14.7 % (ref 11.5–15.5)
WBC: 11 10*3/uL — ABNORMAL HIGH (ref 4.0–10.5)
nRBC: 0.3 % — ABNORMAL HIGH (ref 0.0–0.2)

## 2021-08-16 LAB — GLUCOSE, CAPILLARY
Glucose-Capillary: 98 mg/dL (ref 70–99)
Glucose-Capillary: 129 mg/dL — ABNORMAL HIGH (ref 70–99)
Glucose-Capillary: 106 mg/dL — ABNORMAL HIGH (ref 70–99)
Glucose-Capillary: 115 mg/dL — ABNORMAL HIGH (ref 70–99)

## 2021-08-16 MED ORDER — CHLORHEXIDINE GLUCONATE CLOTH 2 % EX PADS
6.0000 | MEDICATED_PAD | Freq: Every day | CUTANEOUS | Status: DC
  Administered 2021-08-17: 6 via TOPICAL

## 2021-08-16 MED ORDER — RENA-VITE PO TABS
1.0000 | ORAL_TABLET | Freq: Every day | ORAL | Status: DC
  Administered 2021-08-16: 1 via ORAL
  Filled 2021-08-16: qty 1

## 2021-08-16 MED ORDER — CHLORHEXIDINE GLUCONATE CLOTH 2 % EX PADS: 6.0000 | MEDICATED_PAD | Freq: Every day | CUTANEOUS | Status: DC

## 2021-08-16 MED ORDER — ALBUMIN HUMAN 25 % IV SOLN
INTRAVENOUS | Status: AC
  Administered 2021-08-16: 25 g
  Filled 2021-08-16: qty 100

## 2021-08-16 NOTE — Progress Notes (Addendum)
[name] Progress Note   Subjective:    Patient seen and examined at bedside. Reports burping/gas sensation after fluids-concerned for heartburn. Currently on PPI and says it helps occasionally. Planning for possible dc today-home vs SNF for rehab.  Objective Vitals:   08/16/21 0400 08/16/21 0500 08/16/21 0732 08/16/21 0759  BP: (!) 107/55  97/60 110/67  Pulse: 77  74 64  Resp: 17  18 18   Temp: (!) 97.5 F (36.4 C)  97.9 F (36.6 C)   TempSrc: Oral  Oral   SpO2: 93%  94% 98%  Weight:  90.2 kg    Height:       Physical Exam General: Weak-appearing; NAD Heart: S1 and S2; No murmurs, gallops, or rubs Lungs: Clear anteriorly/laterally; No wheezing, rales, or rhonchi Abdomen: Large, soft, non-tender, active bowel sounds, PD catheter RUQ-dressing CDI Extremities: Trace edema BLLE Dialysis Access: RUE AVF (+) Bruit/Thrill   Filed Weights   08/15/21 0811 08/15/21 1200 08/16/21 0500  Weight: 91.5 kg 89.5 kg 90.2 kg    Intake/Output Summary (Last 24 hours) at 08/16/2021 1020 Last data filed at 08/15/2021 1841 Gross per 24 hour  Intake 500 ml  Output 2000 ml  Net -1500 ml    Additional Objective Labs: Basic Metabolic Panel: Recent Labs  Lab 08/11/21 0416 08/12/21 1018 08/14/21 0634 08/15/21 0547 08/16/21 0445  NA 133*   < > 135 134* 138  K 3.0*   < > 3.2* 3.9 4.3  CL 94*   < > 96* 96* 100  CO2 23   < > 27 22 31   GLUCOSE 80   < > 102* 95 96  BUN 23   < > 16 26* 11  CREATININE 4.66*   < > 4.41* 5.50* 3.68*  CALCIUM 9.1   < > 9.5 9.4 9.3  PHOS 4.2  --   --  3.1 2.0*   < > = values in this interval not displayed.   Liver Function Tests: Recent Labs  Lab 08/13/21 0059 08/15/21 0547 08/16/21 0445  AST 12*  --   --   ALT 13  --   --   ALKPHOS 81  --   --   BILITOT 0.8  --   --   PROT 4.7*  --   --   ALBUMIN 2.1* 2.4* 3.0*   No results for input(s): LIPASE, AMYLASE in the last 168 hours. CBC: Recent Labs  Lab 08/10/21 0401 08/11/21 0830  08/12/21 1018 08/14/21 0634 08/16/21 0445  WBC 14.8* 10.0 10.9* 13.8* 11.0*  HGB 8.4* 8.2* 8.0* 7.5* 9.0*  HCT 24.4* 24.2* 24.4* 22.7* 27.5*  MCV 94.9 96.4 96.8 98.3 98.9  PLT 144* 144* 152 211 208   Blood Culture    Component Value Date/Time   SDES BLOOD RIGHT FOREARM 08/06/2021 1730   SPECREQUEST  08/06/2021 1730    BOTTLES DRAWN AEROBIC AND ANAEROBIC Blood Culture results may not be optimal due to an inadequate volume of blood received in culture bottles   CULT  08/06/2021 1730    NO GROWTH 5 DAYS Performed at [facility], [place] [address], [place], [place] [address]    REPTSTATUS 08/11/2021 FINAL 08/06/2021 1730    Cardiac Enzymes: No results for input(s): CKTOTAL, CKMB, CKMBINDEX, TROPONINI in the last 168 hours. CBG: Recent Labs  Lab 08/15/21 0642 08/15/21 1243 08/15/21 1655 08/15/21 2039 08/16/21 0735  GLUCAP 103* 91 138* 136* 98   Iron Studies: No results for input(s): IRON, TIBC, TRANSFERRIN, FERRITIN in  the last 72 hours. Lab Results  Component Value Date   INR 1.0 08/06/2021   INR 1.1 06/29/2019   INR 1.0 06/24/2019   Studies/Results: No results found.  Medications:  sodium chloride Stopped (08/10/21 1039)    acetaminophen  650 mg Oral Q6H   vitamin C  500 mg Oral BID   Chlorhexidine Gluconate Cloth  6 each Topical Q0600   darbepoetin (ARANESP) injection - DIALYSIS  60 mcg Intravenous Q Mon-HD   escitalopram  5 mg Oral Daily   feeding supplement  237 mL Oral Q24H   ferric citrate  420 mg Oral TID WC   gentamicin cream  1 application Topical Daily   heparin  5,000 Units Subcutaneous Q8H   insulin aspart  0-6 Units Subcutaneous TID WC   levothyroxine  75 mcg Oral Q0600   midodrine  15 mg Oral TID WC   multivitamin  1 tablet Oral QHS   pantoprazole  40 mg Oral BID   pseudoephedrine  30 mg Oral BID   saccharomyces boulardii  250 mg Oral BID   senna  1 tablet Oral QHS   simvastatin  40 mg Oral q1800   vitamin A  10,000 Units Oral Daily    zinc sulfate  220 mg Oral BID    Dialysis Orders: [facility] 7x/week, [facility] 6 exchanges, dwell 1.5hrs, fill vol 2800cc, 80kg dry wt  Assessment/Plan: Cholecysitis - +HIDA scan showed non-visualized GB. US showed sludge and stones. SP lap chole on 9/08, IV abx per primary team  Syncopal episode/Dizziness/Hypotension- sp 3 L bolus in ED, getting midodrine here and BP's better. Also on pseudoephedrine (per Cardiology). Off pressors.  ESRD - on CPPD at home. Now on temporary HD while waiting for surgical wounds to heal, 2 wks.  Has R arm AVF HD per MWF schedule-Noted patient over EDW. Plan for HD (short tx and UF only) today.  Chronic hypotension-hypotensive on admit-on Midodrine 15mg  TID here and pseudoephedrine (per Cardiology). Volume overload. stood to weight on 9/10 up and was 11kg up. On room air, no resp issues. BP limits UF. Plan for short HD today in preparation for dc today. Anemia of CKD - started ESA 60 mcg on 9/12. PRBC's received 9/13. Secondary Hyperparathyroidism - on auryxia. monitor trends. Phos in am  Disposition: Spoke with hospitalist, planning for possible dc today. Patient still deciding whether she wants to go to SNF for rehab vs. Home. Plan for patient to speak with SW again today to discuss other SNF options. Plan for short HD (UF only) today. If patient is still here, plan for HD 9/16 per her usual schedule. Renal Navigator to f/u with me on final decision today.  Erika Poet, NP [facility] 08/16/2021,10:20 AM  LOS: 10 days     Seen and examined independently.  Agree with note and exam as documented above by physician extender and as noted here.  Team hoping for SNF today. She is willing to do another treatment   General adult female in bed in no acute distress  HEENT normocephalic atraumatic extraocular movements intact sclera anicteric Neck supple trachea midline Lungs clear to auscultation bilaterally normal work of breathing at rest room  air Heart S1S2 no rub Abdomen soft nondistended Extremities trace to 1+ edema lower extremities and edema in upper extremities 1-2+ Psych normal mood and affect Neuro - alert and oriented x 3 provides hx and follows commands  Access: RUE AVF with b/t and PD catheter in place  Cholecystitis - s/p lab  chole on 9/8.   ESRD normally on PD and now on HD for two weeks to allow abd to heal post op cholecystectomy - plan for UF-only treatment with aid of albumin today as she has hypotension and is 10 kg above EDW.  Then to resume Monday, Tuesday, Thurs, Friday schedule as an outpatient at the [facility] [place] on discharge.  Will get HD tomorrow here vs outpatient unit if still admitted tomorrow  Anemia CKD - esa started on 9/12 and PRBC's on 9/13  Claudia Desanctis, MD 08/16/2021  1:44 PM

## 2021-08-16 NOTE — Progress Notes (Signed)
PT Cancellation Note  Patient Details Name: Erika Fry MRN: [id number] DOB: 1945-12-28   Cancelled Treatment:    Reason Eval/Treat Not Completed: Patient at procedure or test/unavailable. Pt in HD.   [place] 08/16/2021, 4:30 PM [place] Pager [phone] Office [phone]

## 2021-08-16 NOTE — Progress Notes (Signed)
Patient has a seat at [facility] TTS arrival time 11:30 with an 11:50 chairtime. Can start outpatient on Tuesday. [place] notified. Patient will discharge to [facility] tomorrow after dialysis. Left message with Loma Sousa to confirm plan. Melven Sartorius will follow up in the am.

## 2021-08-16 NOTE — Progress Notes (Signed)
Since making changes to her pacemaker and addition of pseudoephedrine, she has been able to undergo dialysis, yesterday 2 L of fluid was ultrafiltrate it.  She remained stable and blood pressure has remained stable.  Hence continue present medical therapy, give pseudoephedrine 1 hour prior to dialysis and continue it during daily basis in the morning and in the afternoon for now, I will see her back in the office.  Discussed with Dr. Oren Binet.    Adrian Prows, MD, [facility] 08/16/2021, 9:57 AM Office: [phone] Fax: [phone] Pager: [phone]

## 2021-08-16 NOTE — TOC Progression Note (Signed)
Transition of Care [facility]) - Progression Note    Patient Details  Name: Erika Fry MRN: [id number] Date of Birth: 10-09-1946  Transition of Care [facility]) CM/SW [place], LCSW Phone Number: 08/16/2021, 3:58 PM  Clinical Narrative:    11am-CSW spoke with patient regarding her agreement to consider other SNFs. Her daughter in law Anderson Malta was also at bedside encouraging her. CSW provided SNF bed offers. Patient requested CSW check into [facility].  [facility] able to accept patient pending dialysis center. CSW spoke with renal navigators Olivia Mackie and Pam to see if she can be accepted at [facility] or [facility] at [facility] request. Patient to have dialysis today.  3pm-Navigator able to arrange patient's OP Dialysis at [facility] TTS at 11:30am arrival to start on Tuesday 9/20. Navigator has arranged for patient to be on first shift HD tomorrow at the hospital and patient can discharge afterwards.   CSW obtained insurance approval: Ref# [id number], effective from 08/17/2021-08/21/2021. CSW requested Anderson Malta have family go to [facility] to sign paperwork tomorrow morning. Requested COVID test from RN.      Expected Discharge Plan: Skilled Nursing Facility Barriers to Discharge: Continued Medical Work up  Expected Discharge Plan and Services Expected Discharge Plan: [place] In-house Referral: Clinical Social Work Discharge Planning Services: CM Consult Post Acute Care Choice: [place] Living arrangements for the past 2 months: Single Family Home                 DME Arranged: N/A         HH Arranged: PT, OT HH Agency: [place] Date [facility] Agency Contacted: 08/14/21 Time [place]: 1448 Representative spoke with at [place]: cory   Social Determinants of Health ([place]) Interventions    Readmission Risk Interventions Readmission Risk Prevention Plan 08/13/2021 12/29/2019 06/15/2019   Transportation Screening Complete Complete Complete  Medication Review Press photographer) Complete Complete Complete  PCP or Specialist appointment within 3-5 days of discharge Complete (No Data) Not Complete  PCP/Specialist Appt Not Complete comments - - not ready for dc  HRI or Home Care Consult Complete Complete Complete  SW Recovery Care/Counseling Consult Complete - Complete  Palliative Care Screening Not Applicable Not Applicable Not [place] Patient Refused Not Applicable Not Applicable  Some recent data might be hidden

## 2021-08-16 NOTE — Progress Notes (Addendum)
Nutrition Follow-up  DOCUMENTATION CODES:   Not applicable  INTERVENTION:   -Continue Ensure Enlive po daily, each supplement provides 350 kcal and 20 grams of protein  -Renal MVI daily -Continue vitamin C, vitamin A, and zinc supplementation  NUTRITION DIAGNOSIS:   Increased nutrient needs related to post-op healing as evidenced by estimated needs.  Ongoing  GOAL:   Patient will meet greater than or equal to 90% of their needs  Progressing   MONITOR:   PO intake, Supplement acceptance, Diet advancement, Labs, Weight trends, Skin, I & O's  REASON FOR ASSESSMENT:   Rounds    ASSESSMENT:   75 year old female who presented to the ED on 9/05 with hypotension and near syncopal episode. PMH of ESRD on PD, AV block with pacemaker, DM, GERD, HLD, hypothyroidism. Pt admitted with sepsis, cholelithiasis.  9/08 - s/p lap cholecystectomy 9/10 - diet advanced to renal  Reviewed I/O's: -1.8 L x 24 hours and +9.2 L since admission  Pt unavailable at time of visit.   Per chart review, pt with no further complaints of nausea, but continues to feel very weak.  Pt with fair intake. Noted meal completion 25-75%. Pt is consuming Ensure supplements.   Per chart review, plan to discharge to SNF vs home health services once medically stable.   Medications reviewed and include vitamin C, aranesp, folarstor, senokot, vitamin A, and zinc sulfate.   Labs reviewed: CBGS: 91-138 (inpatient orders for glycemic control are 0-6 units insulin aspart TID with meals).    Diet Order:   Diet Order             Diet Carb Modified Fluid consistency: Thin; Room service appropriate? Yes; Fluid restriction: 1200 mL Fluid  Diet effective now                   EDUCATION NEEDS:   No education needs have been identified at this time  Skin:  Skin Assessment: Skin Integrity Issues: Skin Integrity Issues:: DTI DTI: L buttocks  Last BM:  08/16/21 (type 4)  Height:   Ht Readings from Last 1  Encounters:  08/09/21 5\' 5"  (1.651 m)    Weight:   Wt Readings from Last 1 Encounters:  08/16/21 90.2 kg   BMI:  Body mass index is 33.09 kg/m.  Estimated Nutritional Needs:   Kcal:  1700-1900  Protein:  95-115 grams  Fluid:  1.5 L/day    Loistine Chance, RD, LDN, [place] Registered Dietitian II Certified Diabetes Care and Education Specialist Please refer to [facility] for RD and/or RD on-call/weekend/after hours pager

## 2021-08-16 NOTE — Progress Notes (Signed)
PROGRESS NOTE    Erika Fry  WGN:[id number] DOB: 01-Apr-1946 DOA: 08/06/2021 PCP: [name], MD   Brief Narrative: 75 year old with past medical history significant for ESRD on peritoneal dialysis, AV block with pacemaker, diabetes, GERD, hyperlipidemia, hypothyroidism, arthritis who presented with generalized weakness, dizziness and abdominal pain.  She apparently ran out of midodrine before this hospitalization.  She was subsequently found to have septic shock due to acute cholecystitis.  She was admitted to the ICU-started on pressors-and underwent cholecystectomy on 9/8.  She was subsequently transferred to the [facility] on 9/8.  Subjective:  No chest pain or shortness of breath.  Initially wanted to go home but now has changed her mind and wants to go to SNF.  Objective: Vitals: Blood pressure 117/62, pulse 75, temperature 98.1 F (36.7 C), temperature source Oral, resp. rate 16, height 5\' 5"  (1.651 m), weight 90.2 kg, SpO2 96 %.   Exam: Gen Exam:Alert awake-not in any distress HEENT:atraumatic, normocephalic Chest: B/L clear to auscultation anteriorly CVS:S1S2 regular Abdomen:soft non tender, non distended Extremities:no edema Neurology: Non focal Skin: no rash   Pertinent labs/radiology Na: 138 Hb:9.0  Assessment & Plan: Septic shock due to acute cholecystitis: Sepsis physiology has resolved-underwent cholecystectomy on 9/8.  No longer on Zosyn.  Hypotension: Chronic issue-BP stable with adjusted PPM settings (reduced heart rate to minimize LVOT obstruction) along with midodrine and pseudoephedrine.  She was able to tolerate HD on 9/14 without any issues.  ESRD: On CPPD at home-transition to temporary HD for 2 weeks given abdominal surgery.  Nephrology planning on dialysis again today.  Normocytic anemia: S/p 1 unit of PRBC on 9/13-defer ESA to nephrology.  Repeat CBC with hemoglobin of 9.0.  Continue to monitor periodically.    Hypothyroidism: Continue Synthroid  Chronic pain syndrome/leg pain: Continue oxycodone  History of complete heart block-s/p PPM placement  History of mild aortic stenosis-moderate LVOT obstruction by echo  Chronic bilateral lower extremity venous insufficiency  8 mm nodular density in left lung: Seen incidentally on CT abdomen on 9/5-stable for outpatient follow-up by PCP  Debility/deconditioning/functional quadriplegia: Due to acute illness-initially was resistant to go to SNF-but after discussion-is now agreeable for SNF.  Pressure injury: Pressure Injury 08/07/21 Buttocks Left Deep Tissue Pressure Injury - Purple or maroon localized area of discolored intact skin or blood-filled blister due to damage of underlying soft tissue from pressure and/or shear. (Active)  08/07/21 0100  Location: Buttocks  Location Orientation: Left  Staging: Deep Tissue Pressure Injury - Purple or maroon localized area of discolored intact skin or blood-filled blister due to damage of underlying soft tissue from pressure and/or shear.  Wound Description (Comments):   Present on Admission: Yes   Estimated body mass index is 33.09 kg/m as calculated from the following:   Height as of this encounter: 5\' 5"  (1.651 m).   Weight as of this encounter: 90.2 kg.   DVT prophylaxis: Heparin Code Status: Full code Family Communication: Daughter-in-law at bedside. Disposition Plan:  Status is: Inpatient  Remains inpatient appropriate because:IV treatments appropriate due to intensity of illness or inability to take PO  Dispo: The patient is from: Home              Anticipated d/c is to:  SNF              Patient currently is not medically stable to d/c.    Difficult to place patient No  Consultants:  General surgery CCM  Cards Renal  Procedures:  Cholecystectomy  9/8   Data Reviewed: I have personally reviewed following labs and imaging studies  CBC: Recent Labs  Lab 08/10/21 0401  08/11/21 0830 08/12/21 1018 08/14/21 0634 08/16/21 0445  WBC 14.8* 10.0 10.9* 13.8* 11.0*  HGB 8.4* 8.2* 8.0* 7.5* 9.0*  HCT 24.4* 24.2* 24.4* 22.7* 27.5*  MCV 94.9 96.4 96.8 98.3 98.9  PLT 144* 144* 152 211 381    Basic Metabolic Panel: Recent Labs  Lab 08/11/21 0416 08/12/21 1018 08/14/21 0634 08/15/21 0547 08/16/21 0445  NA 133* 133* 135 134* 138  K 3.0* 3.4* 3.2* 3.9 4.3  CL 94* 97* 96* 96* 100  CO2 23 20* 27 22 31   GLUCOSE 80 94 102* 95 96  BUN 23 34* 16 26* 11  CREATININE 4.66* 6.20* 4.41* 5.50* 3.68*  CALCIUM 9.1 8.9 9.5 9.4 9.3  PHOS 4.2  --   --  3.1 2.0*    GFR: Estimated Creatinine Clearance: 14.2 mL/min (A) (by C-G formula based on SCr of 3.68 mg/dL (H)). Liver Function Tests: Recent Labs  Lab 08/13/21 0059 08/15/21 0547 08/16/21 0445  AST 12*  --   --   ALT 13  --   --   ALKPHOS 81  --   --   BILITOT 0.8  --   --   PROT 4.7*  --   --   ALBUMIN 2.1* 2.4* 3.0*    No results for input(s): LIPASE, AMYLASE in the last 168 hours.  No results for input(s): AMMONIA in the last 168 hours. Coagulation Profile: No results for input(s): INR, PROTIME in the last 168 hours.  Cardiac Enzymes: No results for input(s): CKTOTAL, CKMB, CKMBINDEX, TROPONINI in the last 168 hours. BNP (last 3 results) No results for input(s): PROBNP in the last 8760 hours. HbA1C: No results for input(s): HGBA1C in the last 72 hours.  CBG: Recent Labs  Lab 08/15/21 1243 08/15/21 1655 08/15/21 2039 08/16/21 0735 08/16/21 1204  GLUCAP 91 138* 136* 98 129*    Lipid Profile: No results for input(s): CHOL, HDL, LDLCALC, TRIG, CHOLHDL, LDLDIRECT in the last 72 hours. Thyroid Function Tests: No results for input(s): TSH, T4TOTAL, FREET4, T3FREE, THYROIDAB in the last 72 hours.  Anemia Panel: No results for input(s): VITAMINB12, FOLATE, FERRITIN, TIBC, IRON, RETICCTPCT in the last 72 hours. Sepsis Labs: Recent Labs  Lab 08/12/21 0748 08/12/21 1018  LATICACIDVEN 1.2  1.6     Recent Results (from the past 240 hour(s))  Resp Panel by RT-PCR (Flu A&B, Covid) Nasopharyngeal Swab     Status: None   Collection Time: 08/06/21  5:25 PM   Specimen: Nasopharyngeal Swab; Nasopharyngeal(NP) swabs in vial transport medium  Result Value Ref Range Status   SARS Coronavirus 2 by RT PCR NEGATIVE NEGATIVE Final    Comment: (NOTE) SARS-CoV-2 target nucleic acids are NOT DETECTED.  The SARS-CoV-2 RNA is generally detectable in upper respiratory specimens during the acute phase of infection. The lowest concentration of SARS-CoV-2 viral copies this assay can detect is 138 copies/mL. A negative result does not preclude SARS-Cov-2 infection and should not be used as the sole basis for treatment or other patient management decisions. A negative result may occur with  improper specimen collection/handling, submission of specimen other than nasopharyngeal swab, presence of viral mutation(s) within the areas targeted by this assay, and inadequate number of viral copies(<138 copies/mL). A negative result must be combined with clinical observations, patient history, and epidemiological information. The expected result is Negative.  Fact Sheet for Patients:  EntrepreneurPulse.com.au  Fact Sheet for Healthcare Providers:  IncredibleEmployment.be  This test is no t yet approved or cleared by the [place] FDA and  has been authorized for detection and/or diagnosis of SARS-CoV-2 by FDA under an Emergency Use Authorization (EUA). This EUA will remain  in effect (meaning this test can be used) for the duration of the COVID-19 declaration under Section 564(b)(1) of the Act, 21 U.S.C.section 360bbb-3(b)(1), unless the authorization is terminated  or revoked sooner.       Influenza A by PCR NEGATIVE NEGATIVE Final   Influenza B by PCR NEGATIVE NEGATIVE Final    Comment: (NOTE) The Xpert Xpress SARS-CoV-2/FLU/RSV plus assay is intended  as an aid in the diagnosis of influenza from Nasopharyngeal swab specimens and should not be used as a sole basis for treatment. Nasal washings and aspirates are unacceptable for Xpert Xpress SARS-CoV-2/FLU/RSV testing.  Fact Sheet for Patients: EntrepreneurPulse.com.au  Fact Sheet for Healthcare Providers: IncredibleEmployment.be  This test is not yet approved or cleared by the [place] FDA and has been authorized for detection and/or diagnosis of SARS-CoV-2 by FDA under an Emergency Use Authorization (EUA). This EUA will remain in effect (meaning this test can be used) for the duration of the COVID-19 declaration under Section 564(b)(1) of the Act, 21 U.S.C. section 360bbb-3(b)(1), unless the authorization is terminated or revoked.  Performed at [facility], [place] [address], [place], [place] [address]   Blood Culture (routine x 2)     Status: None   Collection Time: 08/06/21  5:25 PM   Specimen: BLOOD RIGHT FOREARM  Result Value Ref Range Status   Specimen Description BLOOD RIGHT FOREARM  Final   Special Requests   Final    BOTTLES DRAWN AEROBIC AND ANAEROBIC Blood Culture results may not be optimal due to an inadequate volume of blood received in culture bottles   Culture   Final    NO GROWTH 5 DAYS Performed at [facility], [place] [address] [place], [place] [address]    Report Status 08/11/2021 FINAL  Final  Blood Culture (routine x 2)     Status: None   Collection Time: 08/06/21  5:30 PM   Specimen: BLOOD RIGHT FOREARM  Result Value Ref Range Status   Specimen Description BLOOD RIGHT FOREARM  Final   Special Requests   Final    BOTTLES DRAWN AEROBIC AND ANAEROBIC Blood Culture results may not be optimal due to an inadequate volume of blood received in culture bottles   Culture   Final    NO GROWTH 5 DAYS Performed at [facility], [place] [address] [place], [place] [address]    Report Status 08/11/2021 FINAL   Final  MRSA Next Gen by PCR, Nasal     Status: None   Collection Time: 08/07/21 12:54 AM   Specimen: Nasal Mucosa; Nasal Swab  Result Value Ref Range Status   MRSA by PCR Next Gen NOT DETECTED NOT DETECTED Final    Comment: (NOTE) The GeneXpert MRSA Assay (FDA approved for NASAL specimens only), is one component of a comprehensive MRSA colonization surveillance program. It is not intended to diagnose MRSA infection nor to guide or monitor treatment for MRSA infections. Test performance is not FDA approved in patients less than 50 years old. Performed at [facility], [place] [address] [place], [place] [address]           Radiology Studies: No results found.      Scheduled  Meds:  acetaminophen  650 mg Oral Q6H   vitamin C  500 mg Oral BID   Chlorhexidine Gluconate Cloth  6 each Topical Q0600   darbepoetin (ARANESP) injection - DIALYSIS  60 mcg Intravenous Q Mon-HD   escitalopram  5 mg Oral Daily   feeding supplement  237 mL Oral Q24H   ferric citrate  420 mg Oral TID WC   gentamicin cream  1 application Topical Daily   heparin  5,000 Units Subcutaneous Q8H   insulin aspart  0-6 Units Subcutaneous TID WC   levothyroxine  75 mcg Oral Q0600   midodrine  15 mg Oral TID WC   multivitamin  1 tablet Oral QHS   pantoprazole  40 mg Oral BID   pseudoephedrine  30 mg Oral BID   saccharomyces boulardii  250 mg Oral BID   senna  1 tablet Oral QHS   simvastatin  40 mg Oral q1800   vitamin A  10,000 Units Oral Daily   zinc sulfate  220 mg Oral BID   Continuous Infusions:  sodium chloride Stopped (08/10/21 1039)     LOS: 10 days    Time spent: 25 minutes.     Oren Binet, MD [facility]   If 7PM-7AM, please contact night-coverage [email or web address]  08/16/2021, 1:52 PM

## 2021-08-16 NOTE — NC FL2 (Signed)
[place] MEDICAID FL2 LEVEL OF CARE SCREENING TOOL     IDENTIFICATION  Patient Name: Erika Fry Birthdate: [birth year] Sex: female Admission Date (Current Location): 08/06/2021  [facility] and [place] Number:  Herbalist and Address:  [facility], [place] [address], [place], [place] [address]      Provider Number: [id number]  Attending Physician Name and Address:  Jonetta Osgood, MD  Relative Name and Phone Number:  Janille Draughon [facility]) [phone]    Current Level of Care: Hospital Recommended Level of Care: [place] Prior Approval Number:    Date Approved/Denied:   PASRR Number: [id number]  Discharge Plan: SNF    Current Diagnoses: Patient Active Problem List   Diagnosis Date Noted   Pain of upper abdomen    Septic shock ([place]) 08/06/2021   Hypotension 08/06/2021   Changes in vision 06/21/2021   Temporal arteritis syndrome ([place]) 06/21/2021   Acidosis 10/06/2020   Disorder of lipoprotein metabolism, unspecified 10/06/2020   Liver disease, unspecified 10/06/2020   Moderate protein-calorie malnutrition ([place]) 10/06/2020   Other abnormal findings in urine 10/06/2020   Other dietary vitamin B12 deficiency anemia 10/06/2020   Other disorders of bilirubin metabolism 10/06/2020   Other disorders of electrolyte and fluid balance, not elsewhere classified 10/06/2020   Other disorders resulting from impaired renal tubular function 10/06/2020   Other long term (current) drug therapy 10/06/2020   Allergy, unspecified, initial encounter 08/21/2020   Anaphylactic shock, unspecified, initial encounter 08/21/2020   Nausea 05/18/2020   Acute respiratory disease due to COVID-19 virus 12/25/2019   Encounter for care of pacemaker 09/07/2019   Mild protein-calorie malnutrition ([place]) 07/01/2019   Coagulation defect, unspecified ([place]) 06/29/2019   Secondary hyperparathyroidism of renal origin ([place]) 06/29/2019   ESRD on peritoneal  dialysis ([place])    Pain in both lower extremities    Hypomagnesemia 06/11/2019   DNR (do not resuscitate) discussion    Cystitis    Acute on chronic diastolic CHF (congestive heart failure) ([place]) 06/10/2019   Chronic diastolic CHF (congestive heart failure) ([place]) 06/09/2019   Cellulitis of lower extremity 06/09/2019   Bladder pain    Hyponatremia    Acute on chronic anemia    Diabetes mellitus type 2 in obese ([place])    Chronic kidney disease    Drug induced constipation    Neurogenic bladder    Weakness generalized 04/21/2019   Acquired hydronephrosis with ureteropelvic junction (UPJ) obstruction    Pain in joint involving pelvic region and thigh    Dysuria    Palliative care by specialist    AKI (acute kidney injury) ([place]) 04/10/2019   Hydronephrosis 04/09/2019   Interstitial cystitis 04/09/2019   Bilateral leg edema 02/25/2019   Chronic heart failure with preserved ejection fraction ([place]) 02/24/2019   Pacemaker Medtronic Azure XT DR MRI Dual Chamber Pacemaker 03/25/2018 02/24/2019   ARF (acute renal failure) ([place]) 02/10/2019   Peripheral edema    Fever 01/17/2019   Acute lower UTI 01/17/2019   Pressure injury of skin 01/17/2019   Gross hematuria 01/14/2019   Fluid retention    Generalized edema    Hypokalemia    Acute right-sided low back pain with right-sided sciatica    Acute blood loss anemia    Anemia of chronic disease    Thrombocytopenia (HCC)    Diabetes mellitus ([place])    Morbid obesity ([place])    Hypoalbuminemia due to protein-calorie malnutrition ([place])    Occult blood in stools  Gastric polyp    Lumbar discitis 05/17/2018   Blood loss anemia 05/12/2018   Hyperlipidemia associated with type 2 diabetes mellitus ([place]) 05/10/2018   Yeast UTI 04/30/2018   Chronic kidney disease (CKD), stage IV (severe) (HCC)    Diet-controlled diabetes mellitus (HCC)    GERD (gastroesophageal reflux disease)    Gout    High cholesterol    Hypothyroidism    Iron deficiency  anemia    AV block, Mobitz 2 03/25/2018   Mobitz type 2 second degree AV block 03/20/2018   Exertional dyspnea 03/19/2018   Acute pain of left knee 07/16/2016   Right wrist pain 07/16/2016    Orientation RESPIRATION BLADDER Height & Weight     Self, Time, Situation, Place  Normal Continent Weight: 198 lb 13.7 oz (90.2 kg) Height:  5\' 5"  (165.1 cm)  BEHAVIORAL SYMPTOMS/MOOD NEUROLOGICAL BOWEL NUTRITION STATUS      Incontinent Diet (See DC summary)  AMBULATORY STATUS COMMUNICATION OF NEEDS Skin   Limited Assist Verbally Skin abrasions (Buttock left deep tissue pressure injury; purple or maroon; localized area of discolored intact skin or blood filled blister)                       Personal Care Assistance Level of Assistance  Bathing, Feeding, Dressing Bathing Assistance: Limited assistance Feeding assistance: Independent Dressing Assistance: Limited assistance     Functional Limitations Info  Sight, Hearing, Speech Sight Info: Adequate Hearing Info: Adequate Speech Info: Adequate    SPECIAL CARE FACTORS FREQUENCY  PT (By licensed PT), OT (By licensed OT)     PT Frequency: 5x per week OT Frequency: 5x per week            Contractures Contractures Info: Not present    Additional Factors Info  Code Status, Allergies, Insulin Sliding Scale, Psychotropic Code Status Info: Full Code Allergies Info: Baclofen, Tape, Other, Codeine, Dextromethorphan-guaifenesin Psychotropic Info: escitalopram (LEXAPRO) tablet 5 mg Insulin Sliding Scale Info: insulin aspart (novoLOG) injection 0-6 Units       Current Medications (08/16/2021):  This is the current hospital active medication list Current Facility-Administered Medications  Medication Dose Route Frequency Provider Last Rate Last Admin   0.9 %  sodium chloride infusion  250 mL Intravenous Continuous Regalado, Belkys A, MD   Stopped at 08/10/21 1039   acetaminophen (TYLENOL) tablet 650 mg  650 mg Oral Q6H Regalado, Belkys  A, MD   650 mg at 08/16/21 0755   ascorbic acid (VITAMIN C) tablet 500 mg  500 mg Oral BID Regalado, Belkys A, MD   500 mg at 08/16/21 0800   calcium carbonate (TUMS - dosed in mg elemental calcium) chewable tablet 200 mg of elemental calcium  1 tablet Oral TID PRN Regalado, Belkys A, MD   200 mg of elemental calcium at 08/11/21 1736   Chlorhexidine Gluconate Cloth 2 % PADS 6 each  6 each Topical Q0600 Regalado, Belkys A, MD   6 each at 08/16/21 0759   Darbepoetin Alfa (ARANESP) injection 60 mcg  60 mcg Intravenous Q Mon-HD Claudia Desanctis, MD   60 mcg at 08/13/21 1039   diclofenac Sodium (VOLTAREN) 1 % topical gel 2 g  2 g Topical QID PRN Regalado, Belkys A, MD   2 g at 08/10/21 1037   docusate sodium (COLACE) capsule 100 mg  100 mg Oral BID PRN Regalado, Belkys A, MD   100 mg at 08/07/21 0909   escitalopram (LEXAPRO) tablet 5 mg  5  mg Oral Daily Regalado, Belkys A, MD   5 mg at 08/16/21 0800   feeding supplement (ENSURE ENLIVE / ENSURE PLUS) liquid 237 mL  237 mL Oral Q24H Regalado, Belkys A, MD   237 mL at 08/16/21 0800   ferric citrate (AURYXIA) tablet 420 mg  420 mg Oral TID WC Roney Jaffe, MD   420 mg at 08/16/21 0754   gentamicin cream (GARAMYCIN) 0.1 % 1 application  1 application Topical Daily Regalado, Belkys A, MD   1 application at 46/96/29 1108   heparin injection 5,000 Units  5,000 Units Subcutaneous Q8H Regalado, Belkys A, MD   5,000 Units at 08/16/21 0626   insulin aspart (novoLOG) injection 0-6 Units  0-6 Units Subcutaneous TID WC Regalado, Belkys A, MD   1 Units at 08/14/21 1706   levothyroxine (SYNTHROID) tablet 75 mcg  75 mcg Oral Q0600 Regalado, Belkys A, MD   75 mcg at 08/16/21 0626   midodrine (PROAMATINE) tablet 15 mg  15 mg Oral TID WC Adrian Prows, MD   15 mg at 08/16/21 0755   multivitamin (RENA-VIT) tablet 1 tablet  1 tablet Oral QHS Ghimire, Shanker M, MD       ondansetron [facility]) injection 4 mg  4 mg Intravenous Q6H PRN Regalado, Belkys A, MD   4 mg at 08/13/21 2043    oxyCODONE (Oxy IR/ROXICODONE) immediate release tablet 5 mg  5 mg Oral Q4H PRN Regalado, Belkys A, MD   5 mg at 08/15/21 2355   pantoprazole (PROTONIX) EC tablet 40 mg  40 mg Oral BID Regalado, Belkys A, MD   40 mg at 08/16/21 0800   polyethylene glycol (MIRALAX / GLYCOLAX) packet 17 g  17 g Oral Daily PRN Regalado, Belkys A, MD   17 g at 08/07/21 0909   prochlorperazine (COMPAZINE) injection 10 mg  10 mg Intravenous Q6H PRN Georganna Skeans, MD   10 mg at 08/14/21 0551   pseudoephedrine (SUDAFED) tablet 30 mg  30 mg Oral BID Adrian Prows, MD   30 mg at 08/16/21 0800   saccharomyces boulardii (FLORASTOR) capsule 250 mg  250 mg Oral BID Regalado, Belkys A, MD   250 mg at 08/16/21 0800   senna (SENOKOT) tablet 8.6 mg  1 tablet Oral QHS Regalado, Belkys A, MD   8.6 mg at 08/15/21 2116   simvastatin (ZOCOR) tablet 40 mg  40 mg Oral q1800 Regalado, Belkys A, MD   40 mg at 08/15/21 1729   vitamin A capsule 10,000 Units  10,000 Units Oral Daily Regalado, Belkys A, MD   10,000 Units at 08/16/21 0800   zinc sulfate capsule 220 mg  220 mg Oral BID Regalado, Belkys A, MD   220 mg at 08/16/21 0800     Discharge Medications: Please see discharge summary for a list of discharge medications.  Relevant Imaging Results:  Relevant Lab Results:   Additional Information SSN# [id number]; [place] COVID-19 Vaccine 09/03/2020 , 08/20/2020. Requires transportation to Dialysis 3x/week  Benard Halsted, LCSW

## 2021-08-17 LAB — CBC
HCT: 29.1 % — ABNORMAL LOW (ref 36.0–46.0)
Hemoglobin: 9.2 g/dL — ABNORMAL LOW (ref 12.0–15.0)
MCH: 32.1 pg (ref 26.0–34.0)
MCHC: 31.6 g/dL (ref 30.0–36.0)
MCV: 101.4 fL — ABNORMAL HIGH (ref 80.0–100.0)
Platelets: 210 10*3/uL (ref 150–400)
RBC: 2.87 MIL/uL — ABNORMAL LOW (ref 3.87–5.11)
RDW: 14.8 % (ref 11.5–15.5)
WBC: 11.3 10*3/uL — ABNORMAL HIGH (ref 4.0–10.5)
nRBC: 0 % (ref 0.0–0.2)

## 2021-08-17 LAB — RENAL FUNCTION PANEL
Albumin: 3 g/dL — ABNORMAL LOW (ref 3.5–5.0)
Anion gap: 14 (ref 5–15)
BUN: 21 mg/dL (ref 8–23)
CO2: 26 mmol/L (ref 22–32)
Calcium: 9.6 mg/dL (ref 8.9–10.3)
Chloride: 97 mmol/L — ABNORMAL LOW (ref 98–111)
Creatinine, Ser: 5.11 mg/dL — ABNORMAL HIGH (ref 0.44–1.00)
GFR, Estimated: 8 mL/min — ABNORMAL LOW (ref >60)
Glucose, Bld: 113 mg/dL — ABNORMAL HIGH (ref 70–99)
Phosphorus: 2.1 mg/dL — ABNORMAL LOW (ref 2.5–4.6)
Potassium: 4.2 mmol/L (ref 3.5–5.1)
Sodium: 137 mmol/L (ref 135–145)

## 2021-08-17 LAB — RESP PANEL BY RT-PCR (FLU A&B, COVID) ARPGX2
Influenza A by PCR: NEGATIVE
Influenza B by PCR: NEGATIVE
SARS Coronavirus 2 by RT PCR: NEGATIVE

## 2021-08-17 LAB — GLUCOSE, CAPILLARY
Glucose-Capillary: 91 mg/dL (ref 70–99)
Glucose-Capillary: 90 mg/dL (ref 70–99)
Glucose-Capillary: 133 mg/dL — ABNORMAL HIGH (ref 70–99)

## 2021-08-17 MED ORDER — ALBUMIN HUMAN 25 % IV SOLN
INTRAVENOUS | Status: AC
  Administered 2021-08-17: 25 g
  Filled 2021-08-17: qty 100

## 2021-08-17 MED ORDER — RENA-VITE PO TABS: 1.0000 | ORAL_TABLET | Freq: Every day | ORAL | 0 refills | Status: DC

## 2021-08-17 MED ORDER — MIDODRINE HCL 5 MG PO TABS: 15.0000 mg | ORAL_TABLET | Freq: Three times a day (TID) | ORAL | Status: DC

## 2021-08-17 MED ORDER — DICLOFENAC SODIUM 1 % EX GEL: 2.0000 g | Freq: Four times a day (QID) | CUTANEOUS | Status: DC | PRN

## 2021-08-17 MED ORDER — GENTAMICIN SULFATE 0.1 % EX CREA: 1.0000 "application " | TOPICAL_CREAM | Freq: Every day | CUTANEOUS | 0 refills | Status: DC

## 2021-08-17 MED ORDER — PSEUDOEPHEDRINE HCL 30 MG PO TABS: 30.0000 mg | ORAL_TABLET | Freq: Two times a day (BID) | ORAL | 0 refills | Status: DC

## 2021-08-17 MED ORDER — ENSURE ENLIVE PO LIQD: 237.0000 mL | ORAL | 12 refills | Status: AC

## 2021-08-17 MED ORDER — OXYCODONE HCL 5 MG PO TABS: 5.0000 mg | ORAL_TABLET | Freq: Four times a day (QID) | ORAL | 0 refills | Status: DC | PRN

## 2021-08-17 NOTE — TOC Transition Note (Signed)
Transition of Care [facility]) - CM/SW Discharge Note   Patient Details  Name: Erika Fry MRN: [id number] Date of Birth: 12-20-1945  Transition of Care [facility]) CM/SW Contact:  Benard Halsted, LCSW Phone Number: 08/17/2021, 4:12 PM   Clinical Narrative:    Patient will DC to: [facility] Anticipated DC date: 08/17/21 Family notified: Daughter in Sports coach, Architectural technologist by: Corey Harold -must be at [facility] by 8pm. If not, please cancel transport and patient will need to have dialysis here tomorrow prior to discharge.    Per MD patient ready for DC to [facility]. RN to call report prior to discharge [phone] room [phone]). RN, patient, patient's family, and facility notified of DC. Discharge Summary and FL2 sent to facility. DC packet on chart. Ambulance transport requested for patient.   CSW will sign off for now as social work intervention is no longer needed. Please consult [place] again if new needs arise.     Final next level of care: Skilled Nursing Facility Barriers to Discharge: Barriers Resolved   Patient Goals and CMS Choice Patient states their goals for this hospitalization and ongoing recovery are:: to go home CMS Medicare.gov Compare Post Acute Care list provided to:: Patient Choice offered to / list presented to : Patient  Discharge Placement   Existing PASRR number confirmed : 08/17/21          Patient chooses bed at: [facility] Patient to be transferred to facility by: [place] Name of family member notified: Daughter in law Patient and family notified of of transfer: 08/17/21  Discharge Plan and Services In-house Referral: Clinical Social Work Discharge Planning Services: [employer] Consult Post Acute Care Choice: [place]          DME Arranged: N/A         HH Arranged: PT, OT [place] Agency: [place] Date [place]: 08/14/21 Time [place]: 1448 Representative spoke with at [place]: cory  Social  Determinants of Health ([place]) Interventions     Readmission Risk Interventions Readmission Risk Prevention Plan 08/13/2021 12/29/2019 06/15/2019  Transportation Screening Complete Complete Complete  Medication Review Press photographer) Complete Complete Complete  PCP or Specialist appointment within 3-5 days of discharge Complete (No Data) Not Complete  PCP/Specialist Appt Not Complete comments - - not ready for dc  HRI or Home Care Consult Complete Complete Complete  SW Recovery Care/Counseling Consult Complete - Complete  Palliative Care Screening Not Applicable Not Applicable Not [place] Patient Refused Not Applicable Not Applicable  Some recent data might be hidden

## 2021-08-17 NOTE — Progress Notes (Addendum)
[facility] Progress Note   Subjective:    Seen in HD unit. BP soft but no complaints. Plans for discharge today after HD. Confirmed outpatient HD chair at [facility] for tomorrow.   Objective Vitals:   08/17/21 0849 08/17/21 0859 08/17/21 0930 08/17/21 1000  BP: (!) 95/51 (!) 90/54 (!) 84/48 (!) 106/58  Pulse: 60 68 71 77  Resp:      Temp: 98.4 F (36.9 C)     TempSrc: Oral     SpO2: 97%     Weight: 88.1 kg     Height:       Physical Exam General: Weak-appearing; NAD Heart: S1 and S2; No murmurs, gallops, or rubs Lungs: Clear anteriorly/laterally; No wheezing, rales, or rhonchi Abdomen: Large, soft, non-tender, active bowel sounds, PD catheter RUQ-dressing CDI Extremities: Trace edema BLLE Dialysis Access: RUE AVF in use on HD   Filed Weights   08/16/21 1427 08/16/21 1745 08/17/21 0849  Weight: 90.3 kg 87.8 kg 88.1 kg    Intake/Output Summary (Last 24 hours) at 08/17/2021 1014 Last data filed at 08/17/2021 0735 Gross per 24 hour  Intake 240 ml  Output 2500 ml  Net -2260 ml     Additional Objective Labs: Basic Metabolic Panel: Recent Labs  Lab 08/15/21 0547 08/16/21 0445 08/17/21 0835  NA 134* 138 137  K 3.9 4.3 4.2  CL 96* 100 97*  CO2 22 31 26   GLUCOSE 95 96 113*  BUN 26* 11 21  CREATININE 5.50* 3.68* 5.11*  CALCIUM 9.4 9.3 9.6  PHOS 3.1 2.0* 2.1*    Liver Function Tests: Recent Labs  Lab 08/13/21 0059 08/15/21 0547 08/16/21 0445 08/17/21 0835  AST 12*  --   --   --   ALT 13  --   --   --   ALKPHOS 81  --   --   --   BILITOT 0.8  --   --   --   PROT 4.7*  --   --   --   ALBUMIN 2.1* 2.4* 3.0* 3.0*    No results for input(s): LIPASE, AMYLASE in the last 168 hours. CBC: Recent Labs  Lab 08/11/21 0830 08/12/21 1018 08/14/21 0634 08/16/21 0445 08/17/21 0835  WBC 10.0 10.9* 13.8* 11.0* 11.3*  HGB 8.2* 8.0* 7.5* 9.0* 9.2*  HCT 24.2* 24.4* 22.7* 27.5* 29.1*  MCV 96.4 96.8 98.3 98.9 101.4*  PLT 144* 152 211 208 210    Blood  Culture    Component Value Date/Time   SDES BLOOD RIGHT FOREARM 08/06/2021 1730   SPECREQUEST  08/06/2021 1730    BOTTLES DRAWN AEROBIC AND ANAEROBIC Blood Culture results may not be optimal due to an inadequate volume of blood received in culture bottles   CULT  08/06/2021 1730    NO GROWTH 5 DAYS Performed at [facility], [place] [address], [place], [place] [address]    REPTSTATUS 08/11/2021 FINAL 08/06/2021 1730    Cardiac Enzymes: No results for input(s): CKTOTAL, CKMB, CKMBINDEX, TROPONINI in the last 168 hours. CBG: Recent Labs  Lab 08/16/21 0735 08/16/21 1204 08/16/21 1844 08/16/21 2018 08/17/21 0637  GLUCAP 98 129* 106* 115* 91    Iron Studies: No results for input(s): IRON, TIBC, TRANSFERRIN, FERRITIN in the last 72 hours. Lab Results  Component Value Date   INR 1.0 08/06/2021   INR 1.1 06/29/2019   INR 1.0 06/24/2019   Studies/Results: No results found.  Medications:  sodium chloride Stopped (08/10/21 1039)    acetaminophen  650 mg Oral Q6H   vitamin C  500 mg Oral BID   Chlorhexidine Gluconate Cloth  6 each Topical Q0600   darbepoetin (ARANESP) injection - DIALYSIS  60 mcg Intravenous Q Mon-HD   escitalopram  5 mg Oral Daily   feeding supplement  237 mL Oral Q24H   ferric citrate  420 mg Oral TID WC   gentamicin cream  1 application Topical Daily   heparin  5,000 Units Subcutaneous Q8H   insulin aspart  0-6 Units Subcutaneous TID WC   levothyroxine  75 mcg Oral Q0600   midodrine  15 mg Oral TID WC   multivitamin  1 tablet Oral QHS   pantoprazole  40 mg Oral BID   pseudoephedrine  30 mg Oral BID   saccharomyces boulardii  250 mg Oral BID   senna  1 tablet Oral QHS   simvastatin  40 mg Oral q1800   vitamin A  10,000 Units Oral Daily   zinc sulfate  220 mg Oral BID    Dialysis Orders: CCPD 7x/week, [facility] 6 exchanges, dwell 1.5hrs, fill vol 2800cc, 80kg dry wt  Assessment/Plan: Sepsis/Cholecystis - +HIDA scan showed non-visualized GB. US  showed sludge and stones. SP lap chole on 9/08. S/p course of IV abx.  Syncopal episode/Dizziness/Hypotension- sp 3 L bolus in ED, getting midodrine here and BP's better. Also on pseudoephedrine (per Cardiology). Off pressors.  ESRD - on CCPD at home. Now on temporary HD for 2 wks to allow wounds to heal sp lap chloe.  Has R arm AVF. HD today.  Confirmed OP spot at [facility] TTS. HD again tomorrow at [facility].  Chronic hypotension/volume-hypotensive on admit-on Midodrine 15mg  TID here and pseudoephedrine (per Cardiology). Volume overload. stood to weight on 9/10 up and was 11kg up. On room air, no resp issues. BP limits UF.  UF as able. Titrate weights as outpatient.  Anemia of CKD - started ESA 60 mcg on 9/12. PRBC's received 9/13. Secondary Hyperparathyroidism - on auryxia. monitor trends. Phos low -Follow.  Disposition: For discharge to SNF today.    Lynnda Child PA-C [facility] 08/17/2021,10:14 AM  Seen and examined independently.  Agree with note and exam as documented above by physician extender and as noted here.  See also my HD note from today.   ESRD on HD - will be TTS at [facility] for now - will start there tomorrow.  Normally on CCPD. 2 weeks post op to allow her to heal s/p lap chole   Cholecystitis - s/p lap chole on 9/8  Hypotension - on midodrine. Standing weights. May need increase in weight. S/p HD here with aid of albumin   Anemia CKD - s/p aranesp on 9/12 and s/p PRBC's. Hb 9.1  Claudia Desanctis, MD 08/17/2021  1:02 PM

## 2021-08-17 NOTE — Progress Notes (Deleted)
Attempted to call report to [facility]. Secretary states the nursing staff is busy and will call [facility] at earliest as soon as they are capable.

## 2021-08-17 NOTE — Progress Notes (Signed)
[facility] nurse returned call. Report given.

## 2021-08-17 NOTE — TOC Progression Note (Signed)
Transition of Care [facility]) - Progression Note    Patient Details  Name: Erika Fry MRN: [id number] Date of Birth: 1945-12-29  Transition of Care [facility]) CM/SW [place], LCSW Phone Number: 08/17/2021, 8:41 AM  Clinical Narrative:    CSW received call from Renal Navigator, Olivia Mackie, that patient will need dialysis tomorrow per Nephrologist. Patient can go outpatient to [facility] at 11:30pm. CSW notified [facility] and they are arranging transportation for tomorrow.    Expected Discharge Plan: Skilled Nursing Facility Barriers to Discharge: Continued Medical Work up  Expected Discharge Plan and Services Expected Discharge Plan: [place] In-house Referral: Clinical Social Work Discharge Planning Services: CM Consult Post Acute Care Choice: [place] Living arrangements for the past 2 months: Single Family Home                 DME Arranged: N/A         HH Arranged: PT, OT HH Agency: [place] Date [facility] Agency Contacted: 08/14/21 Time [place]: 1448 Representative spoke with at [place]: cory   Social Determinants of Health ([place]) Interventions    Readmission Risk Interventions Readmission Risk Prevention Plan 08/13/2021 12/29/2019 06/15/2019  Transportation Screening Complete Complete Complete  Medication Review Press photographer) Complete Complete Complete  PCP or Specialist appointment within 3-5 days of discharge Complete (No Data) Not Complete  PCP/Specialist Appt Not Complete comments - - not ready for dc  HRI or Home Care Consult Complete Complete Complete  SW Recovery Care/Counseling Consult Complete - Complete  Palliative Care Screening Not Applicable Not Applicable Not [place] Patient Refused Not Applicable Not Applicable  Some recent data might be hidden

## 2021-08-17 NOTE — Progress Notes (Signed)
PT Cancellation Note  Patient Details Name: Erika Fry MRN: [id number] DOB: [birth year]   Cancelled Treatment:    Reason Eval/Treat Not Completed: Patient at procedure or test/unavailable. Pt in HD. Plan is for d/c to SNF after HD today.   Lorriane Shire 08/17/2021, 8:56 AM  Lorrin Goodell, PT  Office # [phone] Pager [phone]

## 2021-08-17 NOTE — Procedures (Signed)
Seen and examined on dialysis.  Procedure supervised.  Blood pressure 84/48 and HR 71 with repeat 94/51 BP after taken out of UF.  R AVF in use.    She will stand for a post-weight today.    She is able to be discharged today if she is able to get outpatient HD tomorrow.  The HD SW is working on this.  Full note to follow.  She has a Spot for TTS at [facility] but had initially been planned for tues start next week.    Claudia Desanctis, MD 08/17/2021  9:51 AM

## 2021-08-17 NOTE — Discharge Summary (Signed)
PATIENT DETAILS Name: Erika Fry Age: 75 y.o. Sex: female Date of Birth: 1946-08-19 MRN: [id number]. Admitting Physician: Renee Pain, MD O[facility], Ravisankar, MD  Admit Date: 08/06/2021 Discharge date: 08/17/2021  Recommendations for Outpatient Follow-up:  Follow up with PCP in 1-2 weeks Please obtain CMP/CBC in one week Please ensure follow-up with cardiology, nephrology Incidental finding of lung nodule-PCP to pursue appropriate outpatient surveillance CT scan.   Admitted From:  Home  Disposition: SNF   Home Health: No  Equipment/Devices: None  Discharge Condition: Stable  CODE STATUS: FULL CODE  Diet recommendation:  Diet Order             Diet - low sodium heart healthy           Diet Carb Modified Fluid consistency: Thin; Room service appropriate? Yes; Fluid restriction: 1200 mL Fluid  Diet effective now                    Brief Summary: 75 year old with past medical history significant for ESRD on peritoneal dialysis, AV block with pacemaker, diabetes, GERD, hyperlipidemia, hypothyroidism, arthritis who presented with generalized weakness, dizziness and abdominal pain.  She apparently ran out of midodrine before this hospitalization.  She was subsequently found to have septic shock due to acute cholecystitis.  She was admitted to the ICU-started on pressors-and underwent cholecystectomy on 9/8.  She was subsequently transferred to the [facility] hospitalist service on 9/8.  Brief Hospital Course: Septic shock due to acute cholecystitis: Sepsis physiology has resolved-underwent cholecystectomy on 9/8.  No longer on Zosyn.   Hypotension: Chronic issue-BP stable with adjusted PPM settings (reduced heart rate to minimize LVOT obstruction) along with midodrine and pseudoephedrine.  She was able to tolerate HD on 9/14 without any issues.   ESRD: On CPPD at home-transition to temporary HD for 2 weeks total given abdominal surgery.  She will need to be  transitioned back to peritoneal dialysis at the discretion of nephrology.   Normocytic anemia: S/p 1 unit of PRBC on 9/13-defer ESA to nephrology.  Last hemoglobin on 9/15 stable at 9.0.  Hypothyroidism: Continue Synthroid   Chronic pain syndrome/leg pain: Continue oxycodone   History of complete heart block-s/p PPM placement   History of mild aortic stenosis-moderate LVOT obstruction by echo   Chronic bilateral lower extremity venous insufficiency   8 mm nodular density in left lung: Seen incidentally on CT abdomen on 9/5-stable for outpatient follow-up by PCP   Debility/deconditioning/functional quadriplegia: Due to acute illness-initially was resistant to go to SNF-but after discussion-is now agreeable for SNF.   Pressure injury: Pressure Injury 08/07/21 Buttocks Left Deep Tissue Pressure Injury - Purple or maroon localized area of discolored intact skin or blood-filled blister due to damage of underlying soft tissue from pressure and/or shear. (Active)  08/07/21 0100  Location: Buttocks  Location Orientation: Left  Staging: Deep Tissue Pressure Injury - Purple or maroon localized area of discolored intact skin or blood-filled blister due to damage of underlying soft tissue from pressure and/or shear.  Wound Description (Comments):   Present on Admission: Yes    Estimated body mass index is 33.09 kg/m as calculated from the following:   Height as of this encounter: 5\' 5"  (1.651 m).   Weight as of this encounter: 90.2 kg.  RN pressure injury documentation: Pressure Injury 08/07/21 Buttocks Left Deep Tissue Pressure Injury - Purple or maroon localized area of discolored intact skin or blood-filled blister due to damage of underlying soft tissue from pressure and/or  shear. (Active)  08/07/21 0100  Location: Buttocks  Location Orientation: Left  Staging: Deep Tissue Pressure Injury - Purple or maroon localized area of discolored intact skin or blood-filled blister due to damage of  underlying soft tissue from pressure and/or shear.  Wound Description (Comments):   Present on Admission: Yes    Procedures Cholecystectomy  9/8  Discharge Diagnoses:  Principal Problem:   Septic shock ([place]) Active Problems:   Chronic kidney disease (CKD), stage IV (severe) (HCC)   Hypothyroidism   Hypokalemia   Chronic heart failure with preserved ejection fraction (HCC)   Pacemaker Medtronic Azure XT DR MRI Dual Chamber Pacemaker 03/25/2018   Diabetes mellitus type 2 in obese ([place])   ESRD on peritoneal dialysis ([place])   Hypotension   Pain of upper abdomen   Discharge Instructions:  Activity:  As tolerated with Full fall precautions use walker/cane & assistance as needed  Discharge Instructions     Diet - low sodium heart healthy   Complete by: As directed    Discharge wound care:   Complete by: As directed    Clean skin near exit site with chloraprep swab sticks.  Starting at catheter, use circular pattern around exit site, moving towards outer edges of area covered by dressing.  Apply gentamicin cream to site once daily.  Cover with dry dressing.   Increase activity slowly   Complete by: As directed       Allergies as of 08/17/2021       Reactions   Baclofen Other (See Comments)   Somnolence- PUT THE PATIENT INTO A COMA AND "ALMOST KILLED HER"   Tape Other (See Comments)   SKIN WILL TEAR AND BRUISE EASILY!!   Other Other (See Comments)   Patient has a high tolerance to antibiotics- has taken a lot of them during the course of her life   Codeine Other (See Comments)   Increased pain and couldn't sleep   Dextromethorphan-guaifenesin Other (See Comments)   Unknown reaction        Medication List     STOP taking these medications    calcitRIOL 0.5 MCG capsule Commonly known as: ROCALTROL   fluticasone 110 MCG/ACT inhaler Commonly known as: FLOVENT HFA   HYDROcodone-acetaminophen 5-325 MG tablet Commonly known as: NORCO/VICODIN   MIRCERA IJ    prednisoLONE acetate 1 % ophthalmic suspension Commonly known as: PRED FORTE   predniSONE 20 MG tablet Commonly known as: DELTASONE       TAKE these medications    acetaminophen 325 MG tablet Commonly known as: TYLENOL Take 650 mg by mouth every 6 (six) hours as needed for moderate pain or headache.   albuterol 108 (90 Base) MCG/ACT inhaler Commonly known as: VENTOLIN HFA INHALE 2 PUFFS BY MOUTH EVERY 4 TO 6 HOURS AS NEEDED FOR WHEEZING, SEE ADMIN INSTRUCTIONS What changed: See the new instructions.   Biotin 10000 MCG Tabs Take 10,000 mcg by mouth daily with breakfast.   cetirizine 10 MG tablet Commonly known as: ZYRTEC Take 10 mg by mouth at bedtime.   cinacalcet 60 MG tablet Commonly known as: SENSIPAR Take 60 mg by mouth 2 (two) times a week. Mon friday   DIALYVITE 800 WITH ZINC 0.8 MG Tabs Take 1 tablet by mouth daily.   diclofenac Sodium 1 % Gel Commonly known as: VOLTAREN Apply 2 g topically 4 (four) times daily as needed (for lower extremity pain).   escitalopram 5 MG tablet Commonly known as: LEXAPRO Take 5 mg by mouth daily.  feeding supplement Liqd Take 237 mLs by mouth daily.   ferric citrate 1 GM 210 MG(Fe) tablet Commonly known as: AURYXIA Take 420 mg by mouth 3 (three) times daily with meals.   fluticasone 50 MCG/ACT nasal spray Commonly known as: FLONASE Place 2 sprays into both nostrils daily as needed for allergies or rhinitis.   gentamicin cream 0.1 % Commonly known as: GARAMYCIN Apply 1 application topically daily. Apply to exit site.   levothyroxine 75 MCG tablet Commonly known as: SYNTHROID Take 1 tablet (75 mcg total) by mouth daily before breakfast.   midodrine 5 MG tablet Commonly known as: PROAMATINE Take 3 tablets (15 mg total) by mouth 3 (three) times daily with meals. What changed:  how much to take when to take this   multivitamin Tabs tablet Take 1 tablet by mouth at bedtime.   omeprazole 20 MG capsule Commonly  known as: PRILOSEC TAKE 1 CAPSULE BY MOUTH ONCE DAILY BEFORE BREAKFAST What changed:  how much to take how to take this when to take this additional instructions   pseudoephedrine 30 MG tablet Commonly known as: SUDAFED Take 1 tablet (30 mg total) by mouth 2 (two) times daily.   senna 8.6 MG Tabs tablet Commonly known as: SENOKOT Take 1 tablet by mouth at bedtime.   simvastatin 40 MG tablet Commonly known as: ZOCOR Take 1 tablet (40 mg total) by mouth at bedtime.   Vitamin B12 1000 MCG Tbcr Take 1,000 mcg by mouth daily.               Discharge Care Instructions  (From admission, onward)           Start     Ordered   08/17/21 0000  Discharge wound care:       Comments: Clean skin near exit site with chloraprep swab sticks.  Starting at catheter, use circular pattern around exit site, moving towards outer edges of area covered by dressing.  Apply gentamicin cream to site once daily.  Cover with dry dressing.   08/17/21 0945            Contact information for follow-up providers     [facility], [place] Follow up on 08/23/2021.   Specialty: General Surgery Why: 9/22 at 3pm. [facility] arrive 45 minutes prior to your appointment for paperwork. Please bring a copy of your photo ID and insurance card. Contact information: [address] [place] [address] [phone]         [facility] Follow up.   Specialty: [place] Why: for home health services Contact information: [place] [address] [address] [place] [address] [address] [phone]         Adrian Prows, MD. Schedule an appointment as soon as possible for a visit in 2 week(s).   Specialty: Cardiology Contact information: [place] [address] [phone]              Contact information for after-discharge care     Destination     [facility]erred SNF .   Service: Skilled Nursing Contact  information: [place] [phone]                    Allergies  Allergen Reactions   Baclofen Other (See Comments)    Somnolence- PUT THE PATIENT INTO A COMA AND "ALMOST KILLED HER"    Tape Other (See Comments)    SKIN WILL  TEAR AND BRUISE EASILY!!   Other Other (See Comments)    Patient has a high tolerance to antibiotics- has taken a lot of them during the course of her life   Codeine Other (See Comments)    Increased pain and couldn't sleep   Dextromethorphan-Guaifenesin Other (See Comments)    Unknown reaction      Consultations: General surgery CCM  Cards Renal   Other Procedures/Studies: CT ABDOMEN PELVIS WO CONTRAST  Result Date: 08/06/2021 CLINICAL DATA:  Acute abdominal pain. History of peritoneal dialysis. EXAM: CT ABDOMEN AND PELVIS WITHOUT CONTRAST TECHNIQUE: Multidetector CT imaging of the abdomen and pelvis was performed following the standard protocol without IV contrast. COMPARISON:  CT abdomen and pelvis 05/10/2019. FINDINGS: Lower chest: There is a stable 4 mm nodular density in the left lower lobe image 5/5. There is linear scarring or atelectasis in both lung bases. Cannot exclude nodular density in the left lower lung measuring 8 mm image 5/1. Leads are partially visualized in the heart. Hepatobiliary: There is gallbladder sludge versus small layering stones within the gallbladder. There is no biliary ductal dilatation identified. The liver appears within normal limits without focal lesion allowing for lack of intravenous contrast. Pancreas: Unremarkable. No pancreatic ductal dilatation or surrounding inflammatory changes. Spleen: Normal in size without focal abnormality. Adrenals/Urinary Tract: The bilateral adrenal glands appear within normal limits. There is mild bilateral renal atrophy which has progressed compared to the prior study. There are nonobstructing left renal calculi measuring up to 4 mm. There  is a subcentimeter fat containing lesion in the left kidney, unchanged, favored is angiomyolipoma. There is no left-sided hydronephrosis. The left ureter is within normal limits. There is moderate/severe right-sided hydronephrosis to the level of the ureteropelvic junction. There are numerous calculi within the right renal pelvis measuring up to 4 mm. There are additional punctate right renal calculi. No obstructing calculi are identified at the right UPJ or right ureter. The bladder is completely decompressed and not well evaluated. Stomach/Bowel: Stomach is within normal limits. Appendix is not seen. No evidence of bowel wall thickening, distention, or inflammatory changes. Vascular/Lymphatic: Aortic atherosclerosis. No enlarged abdominal or pelvic lymph nodes. Reproductive: Uterus and bilateral adnexa are unremarkable. Other: There is a small amount of free fluid in free air throughout the abdomen and pelvis, presumably from peritoneal dialysis. Peritoneal dialysis catheter is seen entering the anterior abdominal wall with distal tip of the catheter in the left pelvis. The visualized portion of the catheter appears intact. There is no focal abdominal wall hernia. Musculoskeletal: Degenerative changes affect the spine. No acute fractures are seen. IMPRESSION: 1. Small amount of free fluid and free air throughout the abdomen and pelvis, presumably secondary to peritoneal dialysis. 2. Moderate severe right-sided hydronephrosis to the level of the ureteropelvic junction. No obstructing calculus identified. Obstruction may be related to stricture or mass at the UPJ. 3. Right renal pelvic calculi.  Bilateral renal calculi. 4. Cholelithiasis and/or gallbladder sludge. 5. Questionable 8 mm nodular density in the left lower lung, incompletely imaged. Follow-up nonemergent chest CT recommended. Electronically Signed   By: Ronney Asters M.D.   On: 08/06/2021 18:35   DG Knee 1-2 Views Left  Result Date:  08/08/2021 CLINICAL DATA:  Fall on left knee at home. Pain and swelling. Can not bear weight. EXAM: LEFT KNEE - 1-2 VIEW COMPARISON:  None. FINDINGS: No fracture. Medial tibiofemoral joint space narrowing with near complete joint space loss. Moderate tricompartmental peripheral spurring. There is a small knee joint effusion  without lipohemarthrosis. Punctate density projects in the medial soft tissues overlying the distal femur. Vascular calcifications are seen. Mild soft tissue edema. IMPRESSION: 1. No acute fracture or dislocation of the left knee. 2. Moderate osteoarthritis with near complete joint space loss in the medial tibiofemoral compartment. Small knee joint effusion. Electronically Signed   By: Keith Rake M.D.   On: 08/08/2021 13:15   NM Hepatobiliary Liver Func  Result Date: 08/07/2021 CLINICAL DATA:  Sepsis abdominal pain EXAM: NUCLEAR MEDICINE HEPATOBILIARY IMAGING TECHNIQUE: Sequential images of the abdomen were obtained out to 60 minutes following intravenous administration of radiopharmaceutical. Delayed 4 hour images obtained. RADIOPHARMACEUTICALS:  5.5 mCi Tc-59m  Choletec IV COMPARISON:  CT 08/06/2021, ultrasound 08/18/2020 FINDINGS: Prompt uptake and biliary excretion of activity by the liver is seen. No gallbladder activity is visualized during the first hour of imaging, no gallbladder activity visualized on 4 hour delayed image. Normal biliary to bowel transit. IMPRESSION: Nonvisualized gallbladder after 4 hours of imaging, raising concern for cystic duct obstruction/acute cholecystitis. Note that false positives may occur in the setting of prolonged fasting, TPN, and severe liver disease. No recent ultrasound imaging which might be helpful for further evaluation. These results will be called to the ordering clinician or representative by the Radiologist Assistant, and communication documented in the PACS or [employer]. Electronically Signed   By: Donavan Foil M.D.   On:  08/07/2021 21:05   DG Chest Port 1 View  Result Date: 08/06/2021 CLINICAL DATA:  Sepsis protocol EXAM: PORTABLE CHEST 1 VIEW COMPARISON:  Portable exam 1818 hours compared to 05/29/2021 FINDINGS: LEFT subclavian sequential transvenous pacemaker leads project at RIGHT atrium and RIGHT ventricle. Enlargement of cardiac silhouette. Mediastinal contours and pulmonary vascularity normal. Atherosclerotic calcification aorta. Bronchitic changes without infiltrate, pleural effusion, or pneumothorax. Bones demineralized. IMPRESSION: Enlargement of cardiac silhouette post pacemaker. Bronchitic changes without acute infiltrate. Aortic Atherosclerosis (ICD10-I70.0). Electronically Signed   By: Lavonia Dana M.D.   On: 08/06/2021 18:32   ECHOCARDIOGRAM COMPLETE  Result Date: 08/13/2021    ECHOCARDIOGRAM REPORT   Patient Name:   JUDY POLLMAN Date of Exam: 08/13/2021 Medical Rec #:  [id number]          Height:       65.0 in Accession #:    [id number]         Weight:       200.6 lb Date of Birth:  29-Jan-1946          BSA:          1.981 m Patient Age:    17 years           BP:           90/70 mmHg Patient Gender: F                  HR:           93 bpm. Exam Location:  Inpatient Procedure: 2D Echo Indications:     Hypotension  History:         Patient has prior history of Echocardiogram examinations.  Referring Phys:  [id number] Elmarie Shiley Diagnosing Phys: Adrian Prows MD IMPRESSIONS  1. Left ventricular ejection fraction, by estimation, is >75%. The left ventricle has hyperdynamic function. The left ventricle has no regional wall motion abnormalities. There is moderate left ventricular hypertrophy. Left ventricular diastolic function could not be evaluated.  2. Right ventricular systolic function is normal. The right ventricular size is normal. There is moderately elevated  pulmonary artery systolic pressure.  3. Left atrial size was severely dilated.  4. Right atrial size was severely dilated.  5. The mitral valve is normal  in structure. Mild mitral valve regurgitation. No evidence of mitral stenosis.  6. Tricuspid valve regurgitation is moderate.  7. The aortic valve is tricuspid. There is mild calcification of the aortic valve. Aortic valve regurgitation is not visualized. Mild to moderate aortic valve stenosis. Aortic valve area, by VTI measures 1.18 cm. Aortic valve mean gradient measures 33.0 mmHg. Aortic valve Vmax measures 3.80 m/s.  8. The inferior vena cava is dilated in size with >50% respiratory variability, suggesting right atrial pressure of 8 mmHg. FINDINGS  Left Ventricle: Mid cavitary and LVOT obstruction noted. Left ventricular ejection fraction, by estimation, is >75%. The left ventricle has hyperdynamic function. The left ventricle has no regional wall motion abnormalities. The left ventricular internal cavity size was normal in size. There is moderate left ventricular hypertrophy. Left ventricular diastolic function could not be evaluated. Right Ventricle: The right ventricular size is normal. No increase in right ventricular wall thickness. Right ventricular systolic function is normal. There is moderately elevated pulmonary artery systolic pressure. The tricuspid regurgitant velocity is 3.18 m/s, and with an assumed right atrial pressure of 8 mmHg, the estimated right ventricular systolic pressure is 15.4 mmHg. Left Atrium: Left atrial size was severely dilated. Right Atrium: Right atrial size was severely dilated. Pericardium: There is no evidence of pericardial effusion. Mitral Valve: The mitral valve is normal in structure. Mild mitral annular calcification. Mild mitral valve regurgitation. No evidence of mitral valve stenosis. Tricuspid Valve: The tricuspid valve is normal in structure. Tricuspid valve regurgitation is moderate. Aortic Valve: Most pressure gradient appears to come from LVOT obstruction and LV intraventricular pressure gradient. The aortic valve is tricuspid. There is mild calcification of the  aortic valve. Aortic valve regurgitation is not visualized. Mild to moderate aortic stenosis is present. Aortic valve mean gradient measures 33.0 mmHg. Aortic valve peak gradient measures 57.8 mmHg. Aortic valve area, by VTI measures 1.18 cm. Pulmonic Valve: The pulmonic valve was normal in structure. Pulmonic valve regurgitation is trivial. No evidence of pulmonic stenosis. Aorta: The aortic root is normal in size and structure. Venous: The inferior vena cava is dilated in size with greater than 50% respiratory variability, suggesting right atrial pressure of 8 mmHg. IAS/Shunts: No atrial level shunt detected by color flow Doppler. Additional Comments: A device lead is visualized.  LEFT VENTRICLE PLAX 2D LVIDd:         4.10 cm  Diastology LVIDs:         2.60 cm  LV e' medial:    4.57 cm/s LV PW:         1.20 cm  LV E/e' medial:  32.6 LV IVS:        1.50 cm  LV e' lateral:   6.31 cm/s LVOT diam:     2.20 cm  LV E/e' lateral: 23.6 LV SV:         112 LV SV Index:   57 LVOT Area:     3.80 cm  RIGHT VENTRICLE RV Basal diam:  3.60 cm RV S prime:     12.70 cm/s TAPSE (M-mode): 1.9 cm LEFT ATRIUM              Index       RIGHT ATRIUM           Index LA diam:  6.60 cm  3.33 cm/m  RA Area:     22.10 cm LA Vol (A2C):   134.0 ml 67.65 ml/m RA Volume:   67.50 ml  34.08 ml/m LA Vol (A4C):   148.0 ml 74.72 ml/m LA Biplane Vol: 142.0 ml 71.69 ml/m  AORTIC VALVE AV Area (Vmax):    1.52 cm AV Area (Vmean):   1.46 cm AV Area (VTI):     1.18 cm AV Vmax:           380.00 cm/s AV Vmean:          271.000 cm/s AV VTI:            0.947 m AV Peak Grad:      57.8 mmHg AV Mean Grad:      33.0 mmHg LVOT Vmax:         152.00 cm/s LVOT Vmean:        104.000 cm/s LVOT VTI:          0.295 m LVOT/AV VTI ratio: 0.31  AORTA Ao Root diam: 3.50 cm Ao Asc diam:  3.30 cm MITRAL VALVE                TRICUSPID VALVE MV Area (PHT): 2.50 cm     TR Peak grad:   40.4 mmHg MV Decel Time: 304 msec     TR Vmax:        318.00 cm/s MV E velocity:  149.00 cm/s MV A velocity: 204.00 cm/s  SHUNTS MV E/A ratio:  0.73         Systemic VTI:  0.30 m                             Systemic Diam: 2.20 cm Adrian Prows MD Electronically signed by Adrian Prows MD Signature Date/Time: 08/13/2021/5:32:09 PM    Final    US Abdomen Limited RUQ (LIVER/GB)  Result Date: 08/07/2021 CLINICAL DATA:  Gallbladder disease. EXAM: ULTRASOUND ABDOMEN LIMITED RIGHT UPPER QUADRANT COMPARISON:  Nuclear medicine hepatobiliary scan earlier today. CT 08/06/2021. Abdominal ultrasound 08/18/2020 FINDINGS: Gallbladder: Physiologically distended. Layering sludge and possibly small stones. No gallbladder wall thickening. No sonographic Murphy sign noted by sonographer. Common bile duct: Diameter: 4-5 mm. Liver: No intrahepatic biliary ductal dilatation. No focal lesion identified. Within normal limits in parenchymal echogenicity. Portal vein is patent on color Doppler imaging with normal direction of blood flow towards the liver. Other: Right hydronephrosis as seen on CT yesterday. Small amount of right upper quadrant free fluid is seen on CT yesterday. IMPRESSION: 1. Layering gallbladder sludge and possibly small stones. 2. No sonographic findings of acute cholecystitis. No biliary dilatation or abnormal gallbladder distension. Electronically Signed   By: Keith Rake M.D.   On: 08/07/2021 22:45     TODAY-DAY OF DISCHARGE:  Subjective:   Erika Fry today has no headache,no chest abdominal pain,no new weakness tingling or numbness, feels much better wants to go home today.   Objective:   Blood pressure (!) 84/48, pulse 71, temperature 98.4 F (36.9 C), temperature source Oral, resp. rate 20, height 5\' 5"  (1.651 m), weight 88.1 kg, SpO2 97 %.  Intake/Output Summary (Last 24 hours) at 08/17/2021 0945 Last data filed at 08/17/2021 0735 Gross per 24 hour  Intake 240 ml  Output 2500 ml  Net -2260 ml   Filed Weights   08/16/21 1427 08/16/21 1745 08/17/21 0849  Weight: 90.3 kg  87.8 kg 88.1 kg  Exam: Awake Alert, Oriented *3, No new F.N deficits, Normal affect [place].AT,PERRAL Supple Neck,No JVD, No cervical lymphadenopathy appriciated.  Symmetrical Chest wall movement, Good air movement bilaterally, CTAB RRR,No Gallops,Rubs or new Murmurs, No Parasternal Heave +ve B.Sounds, Abd Soft, Non tender, No organomegaly appriciated, No rebound -guarding or rigidity. No Cyanosis, Clubbing or edema, No new Rash or bruise   PERTINENT RADIOLOGIC STUDIES: No results found.   PERTINENT LAB RESULTS: CBC: Recent Labs    08/16/21 0445 08/17/21 0835  WBC 11.0* 11.3*  HGB 9.0* 9.2*  HCT 27.5* 29.1*  PLT 208 210   CMET CMP     Component Value Date/Time   NA 138 08/16/2021 0445   NA 134 07/02/2018 1409   K 4.3 08/16/2021 0445   CL 100 08/16/2021 0445   CO2 31 08/16/2021 0445   GLUCOSE 96 08/16/2021 0445   BUN 11 08/16/2021 0445   BUN 48 (H) 07/02/2018 1409   CREATININE 3.68 (H) 08/16/2021 0445   CREATININE 3.24 (H) 12/21/2018 1029   CALCIUM 9.3 08/16/2021 0445   CALCIUM 9.6 10/31/2017 1050   PROT 4.7 (L) 08/13/2021 0059   ALBUMIN 3.0 (L) 08/16/2021 0445   AST 12 (L) 08/13/2021 0059   ALT 13 08/13/2021 0059   ALKPHOS 81 08/13/2021 0059   BILITOT 0.8 08/13/2021 0059   GFRNONAA 12 (L) 08/16/2021 0445   GFRAA 6 (L) 02/02/2020 2138    GFR Estimated Creatinine Clearance: 14 mL/min (A) (by C-G formula based on SCr of 3.68 mg/dL (H)). No results for input(s): LIPASE, AMYLASE in the last 72 hours. No results for input(s): CKTOTAL, CKMB, CKMBINDEX, TROPONINI in the last 72 hours. Invalid input(s): POCBNP No results for input(s): DDIMER in the last 72 hours. No results for input(s): HGBA1C in the last 72 hours. No results for input(s): CHOL, HDL, LDLCALC, TRIG, CHOLHDL, LDLDIRECT in the last 72 hours. No results for input(s): TSH, T4TOTAL, T3FREE, THYROIDAB in the last 72 hours.  Invalid input(s): FREET3 No results for input(s): VITAMINB12, FOLATE, FERRITIN,  TIBC, IRON, RETICCTPCT in the last 72 hours. Coags: No results for input(s): INR in the last 72 hours.  Invalid input(s): PT Microbiology: No results found for this or any previous visit (from the past 240 hour(s)).  FURTHER DISCHARGE INSTRUCTIONS:  Get Medicines reviewed and adjusted: Please take all your medications with you for your next visit with your Primary MD  Laboratory/radiological data: Please request your Primary MD to go over all hospital tests and procedure/radiological results at the follow up, please ask your Primary MD to get all Hospital records sent to his/her office.  In some cases, they will be blood work, cultures and biopsy results pending at the time of your discharge. Please request that your primary care M.D. goes through all the records of your hospital data and follows up on these results.  Also Note the following: If you experience worsening of your admission symptoms, develop shortness of breath, life threatening emergency, suicidal or homicidal thoughts you must seek medical attention immediately by calling 911 or calling your MD immediately  if symptoms less severe.  You must read complete instructions/literature along with all the possible adverse reactions/side effects for all the Medicines you take and that have been prescribed to you. Take any new Medicines after you have completely understood and accpet all the possible adverse reactions/side effects.   Do not drive when taking Pain medications or sleeping medications (Benzodaizepines)  Do not take more than prescribed Pain, Sleep and Anxiety Medications. It is  not advisable to combine anxiety,sleep and pain medications without talking with your primary care practitioner  Special Instructions: If you have smoked or chewed Tobacco  in the last 2 yrs please stop smoking, stop any regular Alcohol  and or any Recreational drug use.  Wear Seat belts while driving.  Please note: You were cared for by a  hospitalist during your hospital stay. Once you are discharged, your primary care physician will handle any further medical issues. Please note that NO REFILLS for any discharge medications will be authorized once you are discharged, as it is imperative that you return to your primary care physician (or establish a relationship with a primary care physician if you do not have one) for your post hospital discharge needs so that they can reassess your need for medications and monitor your lab values.  Total Time spent coordinating discharge including counseling, education and face to face time equals 35 minutes.  SignedOren Binet 08/17/2021 9:45 AM

## 2021-08-17 NOTE — Discharge Summary (Signed)
PATIENT DETAILS Name: Erika Fry Age: 75 y.o. Sex: female Date of Birth: 14-Oct-1946 MRN: [id number]. Admitting Physician: Renee Pain, MD ALP:FXTK, Ravisankar, MD  Admit Date: 08/06/2021 Discharge date: 08/17/2021  Recommendations for Outpatient Follow-up:  Follow up with PCP in 1-2 weeks Please obtain CMP/CBC in one week Please ensure follow-up with cardiology, nephrology Incidental finding of lung nodule-PCP to pursue appropriate outpatient surveillance CT scan.   Admitted From:  Home  Disposition: SNF   Home Health: No  Equipment/Devices: None  Discharge Condition: Stable  CODE STATUS: FULL CODE  Diet recommendation:  Diet Order             Diet - low sodium heart healthy           Diet Carb Modified Fluid consistency: Thin; Room service appropriate? Yes; Fluid restriction: 1200 mL Fluid  Diet effective now                    Brief Summary: 75 year old with past medical history significant for ESRD on peritoneal dialysis, AV block with pacemaker, diabetes, GERD, hyperlipidemia, hypothyroidism, arthritis who presented with generalized weakness, dizziness and abdominal pain.  She apparently ran out of midodrine before this hospitalization.  She was subsequently found to have septic shock due to acute cholecystitis.  She was admitted to the ICU-started on pressors-and underwent cholecystectomy on 9/8.  She was subsequently transferred to the [facility] hospitalist service on 9/8.  Brief Hospital Course: Septic shock due to acute cholecystitis: Sepsis physiology has resolved-underwent cholecystectomy on 9/8.  No longer on Zosyn.   Hypotension: Chronic issue-BP stable with adjusted PPM settings (reduced heart rate to minimize LVOT obstruction) along with midodrine and pseudoephedrine.  She was able to tolerate HD on 9/14 without any issues.   ESRD: On CPPD at home-transition to temporary HD for 2 weeks total given abdominal surgery.  She will need to be  transitioned back to peritoneal dialysis at the discretion of nephrology.   Normocytic anemia: S/p 1 unit of PRBC on 9/13-defer ESA to nephrology.  Last hemoglobin on 9/15 stable at 9.0.  Hypothyroidism: Continue Synthroid   Chronic pain syndrome/leg pain: Continue oxycodone   History of complete heart block-s/p PPM placement   History of mild aortic stenosis-moderate LVOT obstruction by echo   Chronic bilateral lower extremity venous insufficiency   8 mm nodular density in left lung: Seen incidentally on CT abdomen on 9/5-stable for outpatient follow-up by PCP   Debility/deconditioning/functional quadriplegia: Due to acute illness-initially was resistant to go to SNF-but after discussion-is now agreeable for SNF.   Pressure injury: Pressure Injury 08/07/21 Buttocks Left Deep Tissue Pressure Injury - Purple or maroon localized area of discolored intact skin or blood-filled blister due to damage of underlying soft tissue from pressure and/or shear. (Active)  08/07/21 0100  Location: Buttocks  Location Orientation: Left  Staging: Deep Tissue Pressure Injury - Purple or maroon localized area of discolored intact skin or blood-filled blister due to damage of underlying soft tissue from pressure and/or shear.  Wound Description (Comments):   Present on Admission: Yes    Estimated body mass index is 33.09 kg/m as calculated from the following:   Height as of this encounter: 5\' 5"  (1.651 m).   Weight as of this encounter: 90.2 kg.  RN pressure injury documentation: Pressure Injury 08/07/21 Buttocks Left Deep Tissue Pressure Injury - Purple or maroon localized area of discolored intact skin or blood-filled blister due to damage of underlying soft tissue from pressure and/or  shear. (Active)  08/07/21 0100  Location: Buttocks  Location Orientation: Left  Staging: Deep Tissue Pressure Injury - Purple or maroon localized area of discolored intact skin or blood-filled blister due to damage of  underlying soft tissue from pressure and/or shear.  Wound Description (Comments):   Present on Admission: Yes    Procedures Cholecystectomy  9/8  Discharge Diagnoses:  Principal Problem:   Septic shock ([place]) Active Problems:   Chronic kidney disease (CKD), stage IV (severe) (HCC)   Hypothyroidism   Hypokalemia   Chronic heart failure with preserved ejection fraction (HCC)   Pacemaker Medtronic Azure XT DR MRI Dual Chamber Pacemaker 03/25/2018   Diabetes mellitus type 2 in obese ([place])   ESRD on peritoneal dialysis ([place])   Hypotension   Pain of upper abdomen   Discharge Instructions:  Activity:  As tolerated with Full fall precautions use walker/cane & assistance as needed  Discharge Instructions     Diet - low sodium heart healthy   Complete by: As directed    Discharge wound care:   Complete by: As directed    Clean skin near exit site with chloraprep swab sticks.  Starting at catheter, use circular pattern around exit site, moving towards outer edges of area covered by dressing.  Apply gentamicin cream to site once daily.  Cover with dry dressing.   Increase activity slowly   Complete by: As directed       Allergies as of 08/17/2021       Reactions   Baclofen Other (See Comments)   Somnolence- PUT THE PATIENT INTO A COMA AND "ALMOST KILLED HER"   Tape Other (See Comments)   SKIN WILL TEAR AND BRUISE EASILY!!   Other Other (See Comments)   Patient has a high tolerance to antibiotics- has taken a lot of them during the course of her life   Codeine Other (See Comments)   Increased pain and couldn't sleep   Dextromethorphan-guaifenesin Other (See Comments)   Unknown reaction        Medication List     STOP taking these medications    calcitRIOL 0.5 MCG capsule Commonly known as: ROCALTROL   fluticasone 110 MCG/ACT inhaler Commonly known as: FLOVENT HFA   HYDROcodone-acetaminophen 5-325 MG tablet Commonly known as: NORCO/VICODIN   MIRCERA IJ    prednisoLONE acetate 1 % ophthalmic suspension Commonly known as: PRED FORTE   predniSONE 20 MG tablet Commonly known as: DELTASONE       TAKE these medications    acetaminophen 325 MG tablet Commonly known as: TYLENOL Take 650 mg by mouth every 6 (six) hours as needed for moderate pain or headache.   albuterol 108 (90 Base) MCG/ACT inhaler Commonly known as: VENTOLIN HFA INHALE 2 PUFFS BY MOUTH EVERY 4 TO 6 HOURS AS NEEDED FOR WHEEZING, SEE ADMIN INSTRUCTIONS What changed: See the new instructions.   Biotin 10000 MCG Tabs Take 10,000 mcg by mouth daily with breakfast.   cetirizine 10 MG tablet Commonly known as: ZYRTEC Take 10 mg by mouth at bedtime.   cinacalcet 60 MG tablet Commonly known as: SENSIPAR Take 60 mg by mouth 2 (two) times a week. Mon friday   DIALYVITE 800 WITH ZINC 0.8 MG Tabs Take 1 tablet by mouth daily.   diclofenac Sodium 1 % Gel Commonly known as: VOLTAREN Apply 2 g topically 4 (four) times daily as needed (for lower extremity pain).   escitalopram 5 MG tablet Commonly known as: LEXAPRO Take 5 mg by mouth daily.  feeding supplement Liqd Take 237 mLs by mouth daily.   ferric citrate 1 GM 210 MG(Fe) tablet Commonly known as: AURYXIA Take 420 mg by mouth 3 (three) times daily with meals.   fluticasone 50 MCG/ACT nasal spray Commonly known as: FLONASE Place 2 sprays into both nostrils daily as needed for allergies or rhinitis.   gentamicin cream 0.1 % Commonly known as: GARAMYCIN Apply 1 application topically daily. Apply to exit site.   levothyroxine 75 MCG tablet Commonly known as: SYNTHROID Take 1 tablet (75 mcg total) by mouth daily before breakfast.   midodrine 5 MG tablet Commonly known as: PROAMATINE Take 3 tablets (15 mg total) by mouth 3 (three) times daily with meals. What changed:  how much to take when to take this   multivitamin Tabs tablet Take 1 tablet by mouth at bedtime.   omeprazole 20 MG capsule Commonly  known as: PRILOSEC TAKE 1 CAPSULE BY MOUTH ONCE DAILY BEFORE BREAKFAST What changed:  how much to take how to take this when to take this additional instructions   oxyCODONE 5 MG immediate release tablet Commonly known as: Oxy IR/ROXICODONE Take 1 tablet (5 mg total) by mouth every 6 (six) hours as needed for moderate pain.   pseudoephedrine 30 MG tablet Commonly known as: SUDAFED Take 1 tablet (30 mg total) by mouth 2 (two) times daily.   senna 8.6 MG Tabs tablet Commonly known as: SENOKOT Take 1 tablet by mouth at bedtime.   simvastatin 40 MG tablet Commonly known as: ZOCOR Take 1 tablet (40 mg total) by mouth at bedtime.   Vitamin B12 1000 MCG Tbcr Take 1,000 mcg by mouth daily.               Discharge Care Instructions  (From admission, onward)           Start     Ordered   08/17/21 0000  Discharge wound care:       Comments: Clean skin near exit site with chloraprep swab sticks.  Starting at catheter, use circular pattern around exit site, moving towards outer edges of area covered by dressing.  Apply gentamicin cream to site once daily.  Cover with dry dressing.   08/17/21 0945            Contact information for follow-up providers     Sur[facility], [facility] Follow up on 08/23/2021.   Specialty: General Surgery Why: 9/22 at 3pm. [facility] arrive 45 minutes prior to your appointment for paperwork. Please bring a copy of your photo ID and insurance card. Contact information: [place] [address] [phone]         [facility], [facility] Follow up.   Specialty: [place] Why: for home health services Contact information: [place] [address] [phone]         Adrian Prows, MD. Schedule an appointment as soon as possible for a visit in 2 week(s).   Specialty: Cardiology Contact information: [place] [address] [phone]               Contact information for after-discharge care     Destination     [facility]erred SNF .   Service: Skilled Nursing Contact information: [place] [phone]                    Allergies  Allergen Reactions  Baclofen Other (See Comments)    Somnolence- PUT THE PATIENT INTO A COMA AND "ALMOST KILLED HER"    Tape Other (See Comments)    SKIN WILL TEAR AND BRUISE EASILY!!   Other Other (See Comments)    Patient has a high tolerance to antibiotics- has taken a lot of them during the course of her life   Codeine Other (See Comments)    Increased pain and couldn't sleep   Dextromethorphan-Guaifenesin Other (See Comments)    Unknown reaction      Consultations: General surgery CCM  Cards Renal   Other Procedures/Studies: CT ABDOMEN PELVIS WO CONTRAST  Result Date: 08/06/2021 CLINICAL DATA:  Acute abdominal pain. History of peritoneal dialysis. EXAM: CT ABDOMEN AND PELVIS WITHOUT CONTRAST TECHNIQUE: Multidetector CT imaging of the abdomen and pelvis was performed following the standard protocol without IV contrast. COMPARISON:  CT abdomen and pelvis 05/10/2019. FINDINGS: Lower chest: There is a stable 4 mm nodular density in the left lower lobe image 5/5. There is linear scarring or atelectasis in both lung bases. Cannot exclude nodular density in the left lower lung measuring 8 mm image 5/1. Leads are partially visualized in the heart. Hepatobiliary: There is gallbladder sludge versus small layering stones within the gallbladder. There is no biliary ductal dilatation identified. The liver appears within normal limits without focal lesion allowing for lack of intravenous contrast. Pancreas: Unremarkable. No pancreatic ductal dilatation or surrounding inflammatory changes. Spleen: Normal in size without focal abnormality. Adrenals/Urinary Tract: The bilateral adrenal glands appear within normal limits.  There is mild bilateral renal atrophy which has progressed compared to the prior study. There are nonobstructing left renal calculi measuring up to 4 mm. There is a subcentimeter fat containing lesion in the left kidney, unchanged, favored is angiomyolipoma. There is no left-sided hydronephrosis. The left ureter is within normal limits. There is moderate/severe right-sided hydronephrosis to the level of the ureteropelvic junction. There are numerous calculi within the right renal pelvis measuring up to 4 mm. There are additional punctate right renal calculi. No obstructing calculi are identified at the right UPJ or right ureter. The bladder is completely decompressed and not well evaluated. Stomach/Bowel: Stomach is within normal limits. Appendix is not seen. No evidence of bowel wall thickening, distention, or inflammatory changes. Vascular/Lymphatic: Aortic atherosclerosis. No enlarged abdominal or pelvic lymph nodes. Reproductive: Uterus and bilateral adnexa are unremarkable. Other: There is a small amount of free fluid in free air throughout the abdomen and pelvis, presumably from peritoneal dialysis. Peritoneal dialysis catheter is seen entering the anterior abdominal wall with distal tip of the catheter in the left pelvis. The visualized portion of the catheter appears intact. There is no focal abdominal wall hernia. Musculoskeletal: Degenerative changes affect the spine. No acute fractures are seen. IMPRESSION: 1. Small amount of free fluid and free air throughout the abdomen and pelvis, presumably secondary to peritoneal dialysis. 2. Moderate severe right-sided hydronephrosis to the level of the ureteropelvic junction. No obstructing calculus identified. Obstruction may be related to stricture or mass at the UPJ. 3. Right renal pelvic calculi.  Bilateral renal calculi. 4. Cholelithiasis and/or gallbladder sludge. 5. Questionable 8 mm nodular density in the left lower lung, incompletely imaged. Follow-up  nonemergent chest CT recommended. Electronically Signed   By: Ronney Asters M.D.   On: 08/06/2021 18:35   DG Knee 1-2 Views Left  Result Date: 08/08/2021 CLINICAL DATA:  Fall on left knee at home. Pain and swelling. Can not bear weight. EXAM: LEFT KNEE -  1-2 VIEW COMPARISON:  None. FINDINGS: No fracture. Medial tibiofemoral joint space narrowing with near complete joint space loss. Moderate tricompartmental peripheral spurring. There is a small knee joint effusion without lipohemarthrosis. Punctate density projects in the medial soft tissues overlying the distal femur. Vascular calcifications are seen. Mild soft tissue edema. IMPRESSION: 1. No acute fracture or dislocation of the left knee. 2. Moderate osteoarthritis with near complete joint space loss in the medial tibiofemoral compartment. Small knee joint effusion. Electronically Signed   By: Keith Rake M.D.   On: 08/08/2021 13:15   NM Hepatobiliary Liver Func  Result Date: 08/07/2021 CLINICAL DATA:  Sepsis abdominal pain EXAM: NUCLEAR MEDICINE HEPATOBILIARY IMAGING TECHNIQUE: Sequential images of the abdomen were obtained out to 60 minutes following intravenous administration of radiopharmaceutical. Delayed 4 hour images obtained. RADIOPHARMACEUTICALS:  5.5 mCi Tc-31m  Choletec IV COMPARISON:  CT 08/06/2021, ultrasound 08/18/2020 FINDINGS: Prompt uptake and biliary excretion of activity by the liver is seen. No gallbladder activity is visualized during the first hour of imaging, no gallbladder activity visualized on 4 hour delayed image. Normal biliary to bowel transit. IMPRESSION: Nonvisualized gallbladder after 4 hours of imaging, raising concern for cystic duct obstruction/acute cholecystitis. Note that false positives may occur in the setting of prolonged fasting, TPN, and severe liver disease. No recent ultrasound imaging which might be helpful for further evaluation. These results will be called to the ordering clinician or representative by  the Radiologist Assistant, and communication documented in the PACS or [employer]. Electronically Signed   By: Donavan Foil M.D.   On: 08/07/2021 21:05   DG Chest Port 1 View  Result Date: 08/06/2021 CLINICAL DATA:  Sepsis protocol EXAM: PORTABLE CHEST 1 VIEW COMPARISON:  Portable exam 1818 hours compared to 05/29/2021 FINDINGS: LEFT subclavian sequential transvenous pacemaker leads project at RIGHT atrium and RIGHT ventricle. Enlargement of cardiac silhouette. Mediastinal contours and pulmonary vascularity normal. Atherosclerotic calcification aorta. Bronchitic changes without infiltrate, pleural effusion, or pneumothorax. Bones demineralized. IMPRESSION: Enlargement of cardiac silhouette post pacemaker. Bronchitic changes without acute infiltrate. Aortic Atherosclerosis (ICD10-I70.0). Electronically Signed   By: Lavonia Dana M.D.   On: 08/06/2021 18:32   ECHOCARDIOGRAM COMPLETE  Result Date: 08/13/2021    ECHOCARDIOGRAM REPORT   Patient Name:   DANNIA SNOOK Date of Exam: 08/13/2021 Medical Rec #:  [id number]          Height:       65.0 in Accession #:    [id number]         Weight:       200.6 lb Date of Birth:  March 27, 1946          BSA:          1.981 m Patient Age:    45 years           BP:           90/70 mmHg Patient Gender: F                  HR:           93 bpm. Exam Location:  Inpatient Procedure: 2D Echo Indications:     Hypotension  History:         Patient has prior history of Echocardiogram examinations.  Referring Phys:  [id number] Elmarie Shiley Diagnosing Phys: Adrian Prows MD IMPRESSIONS  1. Left ventricular ejection fraction, by estimation, is >75%. The left ventricle has hyperdynamic function. The left ventricle has no regional wall motion abnormalities. There is  moderate left ventricular hypertrophy. Left ventricular diastolic function could not be evaluated.  2. Right ventricular systolic function is normal. The right ventricular size is normal. There is moderately elevated pulmonary  artery systolic pressure.  3. Left atrial size was severely dilated.  4. Right atrial size was severely dilated.  5. The mitral valve is normal in structure. Mild mitral valve regurgitation. No evidence of mitral stenosis.  6. Tricuspid valve regurgitation is moderate.  7. The aortic valve is tricuspid. There is mild calcification of the aortic valve. Aortic valve regurgitation is not visualized. Mild to moderate aortic valve stenosis. Aortic valve area, by VTI measures 1.18 cm. Aortic valve mean gradient measures 33.0 mmHg. Aortic valve Vmax measures 3.80 m/s.  8. The inferior vena cava is dilated in size with >50% respiratory variability, suggesting right atrial pressure of 8 mmHg. FINDINGS  Left Ventricle: Mid cavitary and LVOT obstruction noted. Left ventricular ejection fraction, by estimation, is >75%. The left ventricle has hyperdynamic function. The left ventricle has no regional wall motion abnormalities. The left ventricular internal cavity size was normal in size. There is moderate left ventricular hypertrophy. Left ventricular diastolic function could not be evaluated. Right Ventricle: The right ventricular size is normal. No increase in right ventricular wall thickness. Right ventricular systolic function is normal. There is moderately elevated pulmonary artery systolic pressure. The tricuspid regurgitant velocity is 3.18 m/s, and with an assumed right atrial pressure of 8 mmHg, the estimated right ventricular systolic pressure is 03.0 mmHg. Left Atrium: Left atrial size was severely dilated. Right Atrium: Right atrial size was severely dilated. Pericardium: There is no evidence of pericardial effusion. Mitral Valve: The mitral valve is normal in structure. Mild mitral annular calcification. Mild mitral valve regurgitation. No evidence of mitral valve stenosis. Tricuspid Valve: The tricuspid valve is normal in structure. Tricuspid valve regurgitation is moderate. Aortic Valve: Most pressure gradient  appears to come from LVOT obstruction and LV intraventricular pressure gradient. The aortic valve is tricuspid. There is mild calcification of the aortic valve. Aortic valve regurgitation is not visualized. Mild to moderate aortic stenosis is present. Aortic valve mean gradient measures 33.0 mmHg. Aortic valve peak gradient measures 57.8 mmHg. Aortic valve area, by VTI measures 1.18 cm. Pulmonic Valve: The pulmonic valve was normal in structure. Pulmonic valve regurgitation is trivial. No evidence of pulmonic stenosis. Aorta: The aortic root is normal in size and structure. Venous: The inferior vena cava is dilated in size with greater than 50% respiratory variability, suggesting right atrial pressure of 8 mmHg. IAS/Shunts: No atrial level shunt detected by color flow Doppler. Additional Comments: A device lead is visualized.  LEFT VENTRICLE PLAX 2D LVIDd:         4.10 cm  Diastology LVIDs:         2.60 cm  LV e' medial:    4.57 cm/s LV PW:         1.20 cm  LV E/e' medial:  32.6 LV IVS:        1.50 cm  LV e' lateral:   6.31 cm/s LVOT diam:     2.20 cm  LV E/e' lateral: 23.6 LV SV:         112 LV SV Index:   57 LVOT Area:     3.80 cm  RIGHT VENTRICLE RV Basal diam:  3.60 cm RV S prime:     12.70 cm/s TAPSE (M-mode): 1.9 cm LEFT ATRIUM  Index       RIGHT ATRIUM           Index LA diam:        6.60 cm  3.33 cm/m  RA Area:     22.10 cm LA Vol (A2C):   134.0 ml 67.65 ml/m RA Volume:   67.50 ml  34.08 ml/m LA Vol (A4C):   148.0 ml 74.72 ml/m LA Biplane Vol: 142.0 ml 71.69 ml/m  AORTIC VALVE AV Area (Vmax):    1.52 cm AV Area (Vmean):   1.46 cm AV Area (VTI):     1.18 cm AV Vmax:           380.00 cm/s AV Vmean:          271.000 cm/s AV VTI:            0.947 m AV Peak Grad:      57.8 mmHg AV Mean Grad:      33.0 mmHg LVOT Vmax:         152.00 cm/s LVOT Vmean:        104.000 cm/s LVOT VTI:          0.295 m LVOT/AV VTI ratio: 0.31  AORTA Ao Root diam: 3.50 cm Ao Asc diam:  3.30 cm MITRAL VALVE                 TRICUSPID VALVE MV Area (PHT): 2.50 cm     TR Peak grad:   40.4 mmHg MV Decel Time: 304 msec     TR Vmax:        318.00 cm/s MV E velocity: 149.00 cm/s MV A velocity: 204.00 cm/s  SHUNTS MV E/A ratio:  0.73         Systemic VTI:  0.30 m                             Systemic Diam: 2.20 cm Adrian Prows MD Electronically signed by Adrian Prows MD Signature Date/Time: 08/13/2021/5:32:09 PM    Final    US Abdomen Limited RUQ (LIVER/GB)  Result Date: 08/07/2021 CLINICAL DATA:  Gallbladder disease. EXAM: ULTRASOUND ABDOMEN LIMITED RIGHT UPPER QUADRANT COMPARISON:  Nuclear medicine hepatobiliary scan earlier today. CT 08/06/2021. Abdominal ultrasound 08/18/2020 FINDINGS: Gallbladder: Physiologically distended. Layering sludge and possibly small stones. No gallbladder wall thickening. No sonographic Murphy sign noted by sonographer. Common bile duct: Diameter: 4-5 mm. Liver: No intrahepatic biliary ductal dilatation. No focal lesion identified. Within normal limits in parenchymal echogenicity. Portal vein is patent on color Doppler imaging with normal direction of blood flow towards the liver. Other: Right hydronephrosis as seen on CT yesterday. Small amount of right upper quadrant free fluid is seen on CT yesterday. IMPRESSION: 1. Layering gallbladder sludge and possibly small stones. 2. No sonographic findings of acute cholecystitis. No biliary dilatation or abnormal gallbladder distension. Electronically Signed   By: Keith Rake M.D.   On: 08/07/2021 22:45     TODAY-DAY OF DISCHARGE:  Subjective:   Sherman Lipuma today has no headache,no chest abdominal pain,no new weakness tingling or numbness, feels much better wants to go home today.   Objective:   Blood pressure (!) 110/52, pulse 70, temperature 98.3 F (36.8 C), temperature source Oral, resp. rate 14, height 5\' 5"  (1.651 m), weight 86.5 kg, SpO2 99 %.  Intake/Output Summary (Last 24 hours) at 08/17/2021 1438 Last data filed at 08/17/2021  1236 Gross per 24 hour  Intake 240 ml  Output 4201 ml  Net -3961 ml   Filed Weights   08/17/21 0849 08/17/21 1242 08/17/21 1315  Weight: 88.1 kg 86.5 kg 86.5 kg    Exam: Awake Alert, Oriented *3, No new F.N deficits, Normal affect [place].AT,PERRAL Supple Neck,No JVD, No cervical lymphadenopathy appriciated.  Symmetrical Chest wall movement, Good air movement bilaterally, CTAB RRR,No Gallops,Rubs or new Murmurs, No Parasternal Heave +ve B.Sounds, Abd Soft, Non tender, No organomegaly appriciated, No rebound -guarding or rigidity. No Cyanosis, Clubbing or edema, No new Rash or bruise   PERTINENT RADIOLOGIC STUDIES: No results found.   PERTINENT LAB RESULTS: CBC: Recent Labs    08/16/21 0445 08/17/21 0835  WBC 11.0* 11.3*  HGB 9.0* 9.2*  HCT 27.5* 29.1*  PLT 208 210   CMET CMP     Component Value Date/Time   NA 137 08/17/2021 0835   NA 134 07/02/2018 1409   K 4.2 08/17/2021 0835   CL 97 (L) 08/17/2021 0835   CO2 26 08/17/2021 0835   GLUCOSE 113 (H) 08/17/2021 0835   BUN 21 08/17/2021 0835   BUN 48 (H) 07/02/2018 1409   CREATININE 5.11 (H) 08/17/2021 0835   CREATININE 3.24 (H) 12/21/2018 1029   CALCIUM 9.6 08/17/2021 0835   CALCIUM 9.6 10/31/2017 1050   PROT 4.7 (L) 08/13/2021 0059   ALBUMIN 3.0 (L) 08/17/2021 0835   AST 12 (L) 08/13/2021 0059   ALT 13 08/13/2021 0059   ALKPHOS 81 08/13/2021 0059   BILITOT 0.8 08/13/2021 0059   GFRNONAA 8 (L) 08/17/2021 0835   GFRAA 6 (L) 02/02/2020 2138    GFR Estimated Creatinine Clearance: 10 mL/min (A) (by C-G formula based on SCr of 5.11 mg/dL (H)). No results for input(s): LIPASE, AMYLASE in the last 72 hours. No results for input(s): CKTOTAL, CKMB, CKMBINDEX, TROPONINI in the last 72 hours. Invalid input(s): POCBNP No results for input(s): DDIMER in the last 72 hours. No results for input(s): HGBA1C in the last 72 hours. No results for input(s): CHOL, HDL, LDLCALC, TRIG, CHOLHDL, LDLDIRECT in the last 72 hours. No  results for input(s): TSH, T4TOTAL, T3FREE, THYROIDAB in the last 72 hours.  Invalid input(s): FREET3 No results for input(s): VITAMINB12, FOLATE, FERRITIN, TIBC, IRON, RETICCTPCT in the last 72 hours. Coags: No results for input(s): INR in the last 72 hours.  Invalid input(s): PT Microbiology: No results found for this or any previous visit (from the past 240 hour(s)).  FURTHER DISCHARGE INSTRUCTIONS:  Get Medicines reviewed and adjusted: Please take all your medications with you for your next visit with your Primary MD  Laboratory/radiological data: Please request your Primary MD to go over all hospital tests and procedure/radiological results at the follow up, please ask your Primary MD to get all Hospital records sent to his/her office.  In some cases, they will be blood work, cultures and biopsy results pending at the time of your discharge. Please request that your primary care M.D. goes through all the records of your hospital data and follows up on these results.  Also Note the following: If you experience worsening of your admission symptoms, develop shortness of breath, life threatening emergency, suicidal or homicidal thoughts you must seek medical attention immediately by calling 911 or calling your MD immediately  if symptoms less severe.  You must read complete instructions/literature along with all the possible adverse reactions/side effects for all the Medicines you take and that have been prescribed to you. Take any new Medicines after you have completely understood and accpet all  the possible adverse reactions/side effects.   Do not drive when taking Pain medications or sleeping medications (Benzodaizepines)  Do not take more than prescribed Pain, Sleep and Anxiety Medications. It is not advisable to combine anxiety,sleep and pain medications without talking with your primary care practitioner  Special Instructions: If you have smoked or chewed Tobacco  in the last 2  yrs please stop smoking, stop any regular Alcohol  and or any Recreational drug use.  Wear Seat belts while driving.  Please note: You were cared for by a hospitalist during your hospital stay. Once you are discharged, your primary care physician will handle any further medical issues. Please note that NO REFILLS for any discharge medications will be authorized once you are discharged, as it is imperative that you return to your primary care physician (or establish a relationship with a primary care physician if you do not have one) for your post hospital discharge needs so that they can reassess your need for medications and monitor your lab values.  Total Time spent coordinating discharge including counseling, education and face to face time equals 35 minutes.  SignedOren Binet 08/17/2021 2:38 PM

## 2021-08-17 NOTE — Progress Notes (Deleted)
Attempted to call report to [facility]. Secretary states the nursing staff is busy and will call [facility] as soon as are able.

## 2021-08-17 NOTE — Progress Notes (Addendum)
Pt's case discussed with nephrologist this am. Pt is in need of HD tomorrow. Spoke to [place] at [facility] who states pt is on the schedule for tomorrow. Pt will need to arrive at 11:30 with a chair time of 11:50. CSW confirms snf can provide transport to HD tomorrow and pt for d/c today. Spoke to [place], Agricultural consultant, at [employer] to advise her of pt's d/c today and to expect pt tomorrow am for treatment. Shirlee Limerick with [facility] aware of pt's d/c today and to provide orders to clinic for treatment tomorrow.   OP HD schedule [place] at 11:30 Chair time is 11:50.   Melven Sartorius  Renal Navigator [phone]

## 2021-08-17 NOTE — TOC Progression Note (Signed)
Transition of Care [facility]) - Progression Note    Patient Details  Name: Erika Fry MRN: [id number] Date of Birth: May 26, 1946  Transition of Care [facility]) CM/SW [place], [place] Phone Number: 08/17/2021, 2:28 PM  Clinical Narrative:    Per lab, they cannot find patient's covid test. MD ordered another, rapid. RN aware to re-swab. [facility] can accept patient until 8pm.    Expected Discharge Plan: [place] Barriers to Discharge: Continued Medical Work up  Expected Discharge Plan and Services Expected Discharge Plan: [place] In-house Referral: Clinical Social Work Discharge Planning Services: CM Consult Post Acute Care Choice: [place] Living arrangements for the past 2 months: Single Family Home Expected Discharge Date: 08/17/21               DME Arranged: N/A         HH Arranged: PT, OT HH Agency: [place] Date [facility] Agency Contacted: 08/14/21 Time [place]: 1448 Representative spoke with at [place]: cory   Social Determinants of Health ([place]) Interventions    Readmission Risk Interventions Readmission Risk Prevention Plan 08/13/2021 12/29/2019 06/15/2019  Transportation Screening Complete Complete Complete  Medication Review Press photographer) Complete Complete Complete  PCP or Specialist appointment within 3-5 days of discharge Complete (No Data) Not Complete  PCP/Specialist Appt Not Complete comments - - not ready for dc  HRI or [place] Complete Complete Complete  SW Recovery Care/Counseling Consult Complete - Complete  Palliative Care Screening Not Applicable Not Applicable Not [place] Patient Refused Not Applicable Not Applicable  Some recent data might be hidden

## 2021-08-17 NOTE — Progress Notes (Signed)
Called report x2 to [facility]. Getting Voice mail

## 2021-08-17 NOTE — Progress Notes (Signed)
Attempted to call report to [facility]. Secretary states the nursing staff is busy and will call [facility] as soon as they are able.

## 2021-08-21 ENCOUNTER — Ambulatory Visit: Payer: Medicare Other | Admitting: Rheumatology

## 2021-08-21 DIAGNOSIS — D696 Thrombocytopenia, unspecified: Secondary | ICD-10-CM

## 2021-08-21 DIAGNOSIS — M4646 Discitis, unspecified, lumbar region: Secondary | ICD-10-CM

## 2021-08-21 DIAGNOSIS — E119 Type 2 diabetes mellitus without complications: Secondary | ICD-10-CM

## 2021-08-21 DIAGNOSIS — A419 Sepsis, unspecified organism: Secondary | ICD-10-CM

## 2021-08-21 DIAGNOSIS — Z8639 Personal history of other endocrine, nutritional and metabolic disease: Secondary | ICD-10-CM

## 2021-08-21 DIAGNOSIS — E1169 Type 2 diabetes mellitus with other specified complication: Secondary | ICD-10-CM

## 2021-08-21 DIAGNOSIS — Z950 Presence of cardiac pacemaker: Secondary | ICD-10-CM

## 2021-08-21 DIAGNOSIS — E46 Unspecified protein-calorie malnutrition: Secondary | ICD-10-CM

## 2021-08-21 DIAGNOSIS — N3010 Interstitial cystitis (chronic) without hematuria: Secondary | ICD-10-CM

## 2021-08-21 DIAGNOSIS — M316 Other giant cell arteritis: Secondary | ICD-10-CM

## 2021-08-21 DIAGNOSIS — N2581 Secondary hyperparathyroidism of renal origin: Secondary | ICD-10-CM

## 2021-08-21 DIAGNOSIS — I441 Atrioventricular block, second degree: Secondary | ICD-10-CM

## 2021-08-21 DIAGNOSIS — I5032 Chronic diastolic (congestive) heart failure: Secondary | ICD-10-CM

## 2021-08-21 DIAGNOSIS — R700 Elevated erythrocyte sedimentation rate: Secondary | ICD-10-CM

## 2021-08-21 DIAGNOSIS — N130 Hydronephrosis with ureteropelvic junction obstruction: Secondary | ICD-10-CM

## 2021-08-21 DIAGNOSIS — N186 End stage renal disease: Secondary | ICD-10-CM

## 2021-08-21 DIAGNOSIS — Z8719 Personal history of other diseases of the digestive system: Secondary | ICD-10-CM

## 2021-08-21 DIAGNOSIS — K317 Polyp of stomach and duodenum: Secondary | ICD-10-CM

## 2021-08-24 ENCOUNTER — Encounter (HOSPITAL_COMMUNITY): Payer: Self-pay | Admitting: Surgery

## 2021-08-24 MED ORDER — HEMOSTATIC AGENTS (NO CHARGE) OPTIME
TOPICAL | Status: DC | PRN
  Administered 2021-08-09: 1 via TOPICAL

## 2021-08-28 NOTE — Progress Notes (Signed)
Primary Physician/Referring:  [name], MD  Patient ID: Erika Fry, female    DOB: Oct 04, 1946, 75 y.o.   MRN: [id number]  Chief Complaint  Patient presents with   Hypotension   Hospitalization Follow-up   HPI:    Erika Fry  is a 75 y.o. Caucasian female with hypertension, CKD now on HD, type 2 DM, hyperlipidemia, s/p Medtronic dual chamber pacemaker for symptomatic high grade AV block, hypothyroidism, anemia, osteopenia, bilateral chronic lower extremity venous insufficiency. History of Covid-19 pneumonia 12/2019.   Since last visit with her office patient has hydrocolloid temporal artery biopsy, she was also admitted 08/06/2021-08/17/2021 with acute cholecystitis and septic shock.  She was admitted to the ICU requiring pressor support and underwent cholecystectomy 08/09/2021.  During her stay patient had persistent hypotension, therefore was seen by Dr. Einar Gip of reprogrammed her pacemaker, uptitrated midodrine, and added pseudoephedrine, which allowed patient to undergo dialysis and improve blood pressure.  Patient now presents for follow-up.  Patient is presently residing in a rehabilitation facility planning to discharge in the next few days.  She is currently doing hemodialysis Tuesday/Thursday/Saturday with plans to transition back to peritoneal dialysis upon discharge.  She has follow-up scheduled with her nephrologist accordingly.  Patient's blood pressure remains soft, however she is relatively asymptomatic with minimal lightheadedness.  However she does remain fatigued but is working with PT and OT and is motivated to regain strength. Patient denies chest pain, palpitations, syncope, near syncope, leg swelling, orthopnea, PND.  Past Medical History:  Diagnosis Date   Arthritis    "knees, thumbs" (03/25/2018)   AV block, [place] 2 03/25/2018   CKD (chronic kidney disease), stage IV (HCC)    Complication of anesthesia    Reports had hard time waking up in the past    Diet-controlled diabetes mellitus ([place])    Encounter for care of pacemaker 09/07/2019   End stage renal failure on dialysis ([place])    T, Th, Sat [place]   GERD (gastroesophageal reflux disease)    Gout    "on daily RX" (03/25/2018)   Heart murmur    High cholesterol    History of COVID-19 12/2019   Hypothyroidism    Iron deficiency anemia    "had to get an iron infusion"   Migraine    "used to have them growing up" (03/25/2018)   Pneumonia 12/2019   with COVID   Presence of permanent cardiac pacemaker 03/25/2018   medtronic dual pacemaker: Dr Crissie Sickles   Past Surgical History:  Procedure Laterality Date   APPENDECTOMY     ARTERY BIOPSY Right 06/25/2021   Procedure: TEMPORAL ARTERY BIOPSY RIGHT;  Surgeon: Armandina Gemma, MD;  Location: [facility];  Service: General;  Laterality: Right;  LMA   AV FISTULA PLACEMENT Right 06/29/2019   Procedure: ARTERIOVENOUS (AV) FISTULA CREATION RIGHT UPPER  ARM;  Surgeon: Waynetta Sandy, MD;  Location: [place];  Service: Vascular;  Laterality: Right;   [place] Right 09/08/2019   Procedure: BASILIC VEIN TRANSPOSITION RIGHT SECOND STAGE;  Surgeon: Waynetta Sandy, MD;  Location: [place];  Service: Vascular;  Laterality: Right;   BIOPSY  05/19/2018   Procedure: BIOPSY;  Surgeon: Ladene Artist, MD;  Location: [place];  Service: Endoscopy;;   CHOLECYSTECTOMY N/A 08/09/2021   Procedure: LAPAROSCOPIC CHOLECYSTECTOMY;  Surgeon: Jesusita Oka, MD;  Location: [place];  Service: General;  Laterality: N/A;   CYSTOSCOPY W/ RETROGRADES Bilateral 01/14/2019   Procedure: CYSTOSCOPY WITH RETROGRADE PYELOGRAM BILATERAL  HYDRODISTENTION;  Surgeon: Ardis Hughs, MD;  Location: [facility];  Service: Urology;  Laterality: Bilateral;   ESOPHAGOGASTRODUODENOSCOPY N/A 05/19/2018   Procedure: ESOPHAGOGASTRODUODENOSCOPY (EGD);  Surgeon: Ladene Artist, MD;  Location: [facility];  Service: Endoscopy;  Laterality: N/A;    ESOPHAGOGASTRODUODENOSCOPY (EGD) WITH PROPOFOL N/A 01/28/2019   Procedure: ESOPHAGOGASTRODUODENOSCOPY (EGD) WITH PROPOFOL;  Surgeon: Rush Landmark Telford Nab., MD;  Location: [place];  Service: Gastroenterology;  Laterality: N/A;   INSERT / REPLACE / REMOVE PACEMAKER  03/25/2018   IR FLUORO GUIDE CV LINE RIGHT  05/18/2018   IR FLUORO GUIDE CV LINE RIGHT  06/24/2019   IR LUMBAR DISC ASPIRATION W/IMG GUIDE  05/15/2018   IR NEPHROSTOMY EXCHANGE RIGHT  05/28/2019   IR NEPHROSTOMY EXCHANGE RIGHT  07/19/2019   IR NEPHROSTOMY PLACEMENT RIGHT  04/09/2019   IR REMOVAL TUN CV CATH W/O FL  07/23/2018   IR US GUIDE VASC ACCESS RIGHT  05/18/2018   IR US GUIDE VASC ACCESS RIGHT  06/24/2019   KNEE ARTHROSCOPY Bilateral    PACEMAKER IMPLANT N/A 03/25/2018   Procedure: PACEMAKER IMPLANT;  Surgeon: Evans Lance, MD;  Location: [place] [facility];  Service: Cardiovascular;  Laterality: N/A;   PACEMAKER PLACEMENT Left 03/2018   POLYPECTOMY  01/28/2019   Procedure: POLYPECTOMY;  Surgeon: Mansouraty, Telford Nab., MD;  Location: [place];  Service: Gastroenterology;;   RIGHT HEART CATH N/A 03/20/2018   Procedure: RIGHT HEART CATH;  Surgeon: Nigel Mormon, MD;  Location: [place] [facility];  Service: Cardiovascular;  Laterality: N/A;   TEE WITHOUT CARDIOVERSION N/A 03/20/2018   Procedure: TRANSESOPHAGEAL ECHOCARDIOGRAM (TEE);  Surgeon: Nigel Mormon, MD;  Location: [facility];  Service: Cardiovascular;  Laterality: N/A;   TONSILLECTOMY     URETEROSCOPY WITH HOLMIUM LASER LITHOTRIPSY Right 01/14/2019   Procedure: URETEROSCOPY WITH HOLMIUM LASER LITHOTRIPSY BLADDER BIOPSIES RIGHT STENT PLACEMENT;  Surgeon: Ardis Hughs, MD;  Location: [facility];  Service: Urology;  Laterality: Right;   Family History  Problem Relation Age of Onset   Hypertension Mother    Diabetes Mellitus II Father    Heart disease Father    Heart attack Father    Gastric cancer Brother    Diabetes Mellitus II Brother     Stomach cancer Brother     Social History   Tobacco Use   Smoking status: Never   Smokeless tobacco: Never  Substance Use Topics   Alcohol use: Not Currently   Marital Status: Widowed  ROS  Review of Systems  Constitutional: Positive for malaise/fatigue (improving slowly). Negative for weight gain.  Cardiovascular:  Positive for dyspnea on exertion. Negative for chest pain, claudication, leg swelling, near-syncope, orthopnea, palpitations, paroxysmal nocturnal dyspnea and syncope.  Respiratory:  Negative for cough and shortness of breath.   Hematologic/Lymphatic: Does not bruise/bleed easily.  Musculoskeletal:  Positive for arthritis and back pain.  Gastrointestinal:  Negative for heartburn and melena.  Neurological:  Negative for dizziness and weakness.  Objective  Blood pressure (!) 101/50, pulse 70, height _0  (1.651 m), weight 191 lb (86.6 kg), SpO2 98 %.  Vitals with BMI 08/29/2021 08/17/2021 08/17/2021  Height _1  - -  Weight 191 lbs - 190 lbs 11 oz  BMI 75.44 - 92.01  Systolic 007 121 975  Diastolic 50 56 52  Pulse 70 60 70     Physical Exam Vitals reviewed.  Constitutional:      General: She is not in acute distress.    Appearance: She is well-developed.  Comments: Mildly obese Wheelchair-bound  Cardiovascular:     Rate and Rhythm: Normal rate and regular rhythm.     Pulses: Intact distal pulses.          Carotid pulses are 2+ on the right side and 2+ on the left side.      Femoral pulses are 0 on the right side and 0 on the left side.      Popliteal pulses are 0 on the right side and 0 on the left side.       Dorsalis pedis pulses are 1+ on the right side and 1+ on the left side.       Posterior tibial pulses are 0 on the right side and 0 on the left side.     Heart sounds: S1 normal and S2 normal. Heart sounds are distant. No murmur heard.   No gallop.     Comments: There is trace leg edema with chronic dermatitis changes noted.  Femoral and popliteal  pulse could not be felt due to her body habitus.  Faint bilateral carotid bruit heard.  No JVD. Pulmonary:     Effort: Pulmonary effort is normal. No accessory muscle usage or respiratory distress.     Breath sounds: Normal breath sounds. No wheezing, rhonchi or rales.  Abdominal:     Comments:    Skin:    General: Skin is warm and dry.   Laboratory examination:   Recent Labs    08/15/21 0547 08/16/21 0445 08/17/21 0835  NA 134* 138 137  K 3.9 4.3 4.2  CL 96* 100 97*  CO2 _0 GLUCOSE 95 96 113*  BUN 26* 11 21  CREATININE 5.50* 3.68* 5.11*  CALCIUM 9.4 9.3 9.6  GFRNONAA 8* 12* 8*   estimated creatinine clearance is 10 mL/min (A) (by C-G formula based on SCr of 5.11 mg/dL (H)).  CMP Latest Ref Rng & Units 08/17/2021 08/16/2021 08/15/2021  Glucose 70 - 99 mg/dL 113(H) 96 95  BUN 8 - 23 mg/dL 21 11 26(H)  Creatinine 0.44 - 1.00 mg/dL 5.11(H) 3.68(H) 5.50(H)  Sodium 135 - 145 mmol/L 137 138 134(L)  Potassium 3.5 - 5.1 mmol/L 4.2 4.3 3.9  Chloride 98 - 111 mmol/L 97(L) 100 96(L)  CO2 22 - 32 mmol/L _1 Calcium 8.9 - 10.3 mg/dL 9.6 9.3 9.4  Total Protein 6.5 - 8.1 g/dL - - -  Total Bilirubin 0.3 - 1.2 mg/dL - - -  Alkaline Phos 38 - 126 U/L - - -  AST 15 - 41 U/L - - -  ALT 0 - 44 U/L - - -   CBC Latest Ref Rng & Units 08/17/2021 08/16/2021 08/14/2021  WBC 4.0 - 10.5 K/uL 11.3(H) 11.0(H) 13.8(H)  Hemoglobin 12.0 - 15.0 g/dL 9.2(L) 9.0(L) 7.5(L)  Hematocrit 36.0 - 46.0 % 29.1(L) 27.5(L) 22.7(L)  Platelets 150 - 400 K/uL 210 208 211   Lipid Panel     Component Value Date/Time   TRIG 194 (H) 12/25/2019 1157   HEMOGLOBIN A1C Lab Results  Component Value Date   HGBA1C 6.2 (H) 08/07/2021   MPG 131.24 08/07/2021   BNP    Component Value Date/Time   BNP 142.4 (H) 08/06/2021 1726    ProBNP No results found for: PROBNP   External Labs: 03/19/2021: Glucose 107, BUN 33, creatinine 10.0, GFR 3.8, sodium 138, potassium 4.3, AST 13, ALT 14, alk phos  204 Hemoglobin 10.4, hematocrit 33.0, MCV 100.2, platelet 86 Total cholesterol 173, triglycerides  228, HDL 37, LDL 90, non-HDL 136 TSH 1.78, free T4 1.0 Vitamin D 17.7 (low)  Triglycerides 194.000 12/25/2019  HDL 29 MG/DL 01/07/2019 LDL 213.000 m 01/07/2019 Cholesterol, total 304.000 m 01/07/2019  A1C 5.600 % 10/13/2019 TSH 0.700 micr 01/07/2019 Allergies   Allergies  Allergen Reactions   Baclofen Other (See Comments)    Somnolence- PUT THE PATIENT INTO A COMA AND "ALMOST KILLED HER"    Tape Other (See Comments)    SKIN WILL TEAR AND BRUISE EASILY!!   Other Other (See Comments)    Patient has a high tolerance to antibiotics- has taken a lot of them during the course of her life   Codeine Other (See Comments)    Increased pain and couldn't sleep   Dextromethorphan-Guaifenesin Other (See Comments)    Unknown reaction      Medications Prior to Visit:   Outpatient Medications Prior to Visit  Medication Sig Dispense Refill   acetaminophen (TYLENOL) 325 MG tablet Take 650 mg by mouth every 6 (six) hours as needed for moderate pain or headache.     albuterol (VENTOLIN HFA) 108 (90 Base) MCG/ACT inhaler INHALE 2 PUFFS BY MOUTH EVERY 4 TO 6 HOURS AS NEEDED FOR WHEEZING, SEE ADMIN INSTRUCTIONS (Patient taking differently: Inhale 2 puffs into the lungs every 4 (four) hours as needed for shortness of breath or wheezing.) 9 g 0   B Complex-C-Zn-Folic Acid (DIALYVITE 997 WITH ZINC) 0.8 MG TABS Take 1 tablet by mouth daily.      Biotin 10000 MCG TABS Take 10,000 mcg by mouth daily with breakfast.     cetirizine (ZYRTEC) 10 MG tablet Take 10 mg by mouth at bedtime.      cinacalcet (SENSIPAR) 60 MG tablet Take 60 mg by mouth 2 (two) times a week. Mon friday     Cyanocobalamin (VITAMIN B12) 1000 MCG TBCR Take 1,000 mcg by mouth daily.     diclofenac Sodium (VOLTAREN) 1 % GEL Apply 2 g topically 4 (four) times daily as needed (for lower extremity pain).     escitalopram (LEXAPRO) 5 MG tablet Take 5  mg by mouth daily.     feeding supplement (ENSURE ENLIVE / ENSURE PLUS) LIQD Take 237 mLs by mouth daily. 237 mL 12   ferric citrate (AURYXIA) 1 GM 210 MG(Fe) tablet Take 420 mg by mouth 3 (three) times daily with meals.      fluticasone (FLONASE) 50 MCG/ACT nasal spray Place 2 sprays into both nostrils daily as needed for allergies or rhinitis.     gentamicin cream (GARAMYCIN) 0.1 % Apply 1 application topically daily. Apply to exit site. 15 g 0   levothyroxine (SYNTHROID) 75 MCG tablet Take 1 tablet (75 mcg total) by mouth daily before breakfast. 30 tablet 1   midodrine (PROAMATINE) 5 MG tablet Take 3 tablets (15 mg total) by mouth 3 (three) times daily with meals.     multivitamin (RENA-VIT) TABS tablet Take 1 tablet by mouth at bedtime.  0   omeprazole (PRILOSEC) 20 MG capsule TAKE 1 CAPSULE BY MOUTH ONCE DAILY BEFORE BREAKFAST (Patient taking differently: Take 20 mg by mouth daily.) 30 capsule 0   oxyCODONE (OXY IR/ROXICODONE) 5 MG immediate release tablet Take 1 tablet (5 mg total) by mouth every 6 (six) hours as needed for moderate pain. 20 tablet 0   pseudoephedrine (SUDAFED) 30 MG tablet Take 1 tablet (30 mg total) by mouth 2 (two) times daily. 30 tablet 0   senna (SENOKOT) 8.6 MG TABS tablet Take  1 tablet by mouth at bedtime.     simvastatin (ZOCOR) 40 MG tablet Take 1 tablet (40 mg total) by mouth at bedtime.     No facility-administered medications prior to visit.     Final Medications at End of Visit    Current Meds  Medication Sig   acetaminophen (TYLENOL) 325 MG tablet Take 650 mg by mouth every 6 (six) hours as needed for moderate pain or headache.   albuterol (VENTOLIN HFA) 108 (90 Base) MCG/ACT inhaler INHALE 2 PUFFS BY MOUTH EVERY 4 TO 6 HOURS AS NEEDED FOR WHEEZING, SEE ADMIN INSTRUCTIONS (Patient taking differently: Inhale 2 puffs into the lungs every 4 (four) hours as needed for shortness of breath or wheezing.)   B Complex-C-Zn-Folic Acid (DIALYVITE 563 WITH ZINC) 0.8  MG TABS Take 1 tablet by mouth daily.    Biotin 10000 MCG TABS Take 10,000 mcg by mouth daily with breakfast.   cetirizine (ZYRTEC) 10 MG tablet Take 10 mg by mouth at bedtime.    cinacalcet (SENSIPAR) 60 MG tablet Take 60 mg by mouth 2 (two) times a week. Mon friday   Cyanocobalamin (VITAMIN B12) 1000 MCG TBCR Take 1,000 mcg by mouth daily.   diclofenac Sodium (VOLTAREN) 1 % GEL Apply 2 g topically 4 (four) times daily as needed (for lower extremity pain).   escitalopram (LEXAPRO) 5 MG tablet Take 5 mg by mouth daily.   feeding supplement (ENSURE ENLIVE / ENSURE PLUS) LIQD Take 237 mLs by mouth daily.   ferric citrate (AURYXIA) 1 GM 210 MG(Fe) tablet Take 420 mg by mouth 3 (three) times daily with meals.    fluticasone (FLONASE) 50 MCG/ACT nasal spray Place 2 sprays into both nostrils daily as needed for allergies or rhinitis.   gentamicin cream (GARAMYCIN) 0.1 % Apply 1 application topically daily. Apply to exit site.   levothyroxine (SYNTHROID) 75 MCG tablet Take 1 tablet (75 mcg total) by mouth daily before breakfast.   midodrine (PROAMATINE) 5 MG tablet Take 3 tablets (15 mg total) by mouth 3 (three) times daily with meals.   multivitamin (RENA-VIT) TABS tablet Take 1 tablet by mouth at bedtime.   omeprazole (PRILOSEC) 20 MG capsule TAKE 1 CAPSULE BY MOUTH ONCE DAILY BEFORE BREAKFAST (Patient taking differently: Take 20 mg by mouth daily.)   oxyCODONE (OXY IR/ROXICODONE) 5 MG immediate release tablet Take 1 tablet (5 mg total) by mouth every 6 (six) hours as needed for moderate pain.   pseudoephedrine (SUDAFED) 30 MG tablet Take 1 tablet (30 mg total) by mouth 2 (two) times daily.   senna (SENOKOT) 8.6 MG TABS tablet Take 1 tablet by mouth at bedtime.   simvastatin (ZOCOR) 40 MG tablet Take 1 tablet (40 mg total) by mouth at bedtime.   Radiology:   CT Abdomen Pelvis Wo Contrast 05/10/2019: 1. Right percutaneous nephrostomy tube in place with resolved right hydronephrosis.  2. Similar  appearing bilateral nonobstructive nephrolithiasis.  3. Aortic atherosclerosis.  DG Chest 02/03/2020:  Cardiomegaly and mild pulmonary vascular congestion.  CT Chest High Resolution 07/24/2020: 1. Bland, bandlike scarring of the bilateral lung bases, consistent with post infectious or inflammatory scarring. No evidence of fibrotic interstitial lung disease. 2. Mild lobular air trapping on expiratory phase imaging, consistent with small airways disease. 3. There is a macroscopically fat attenuation nodule of the lingula measuring 1.5 x 1.3 cm, not significantly changed compared to prior examination. This is consistent with a benign pulmonary hamartoma. 4. Cardiomegaly and coronary artery disease. 5. Aortic valve calcifications.  Aortic Atherosclerosis  Chest x-ray 02/27/2021: The heart size and mediastinal contours are within normal limits. Left-sided pacemaker is unchanged in position. No pneumothorax or pleural effusion is noted. Right lung is clear. Stable rounded density seen in left lingular region consistent with hamartoma is noted on prior CT. No acute pulmonary disease is noted. The visualized skeletal structures are unremarkable. IMPRESSION: No active cardiopulmonary disease. Stable hamartoma seen in left lingular region.  Cardiac Studies:  Carotid artery duplex 03/10/2018: Minimal stenosis in the right internal carotid artery (1-15%). Stenosis in the left internal carotid artery (16-49%). Antegrade right vertebral artery flow. Antegrade left vertebral artery flow. Follow up in one year is appropriate if clinically indicated.  TEE 03/20/2018: - Left ventricle: There was moderate concentric hypertrophy with  severe basal septal hypertrophy- likely reason for LVOT gradient.  No SAM, no sub aortic membrane. Hyperdynamic LV> LVEF >70%. Wall  motion was normal; there were no regional wall motion abnormalities. - Aortic valve: Mild annular calcification. Normal leaflets with normal  excursion. Mean PG 33 mmHg, Vmax 4 m/sec, however gradient arising throughout LV outflow tract without significant aortic valvular stenosis.  AVA by continuity equation at least 1.4 cm2. - Mitral valve: Mildly to moderately calcified annulus. Normal   thickness leaflets . There was mild regurgitation. - Left atrium: No evidence of thrombus in the atrial cavity or   appendage. Impressions:  - Hyperdynamic LV with severe basal septal hypertrophy and LVOT   obstruction. No significant valvular stenosis.  Right heart cath 03/20/2018: RA pressure 3 mmhg RVSP 48 mmHg, RVEDP 6 mmhg PAP 42/9 mmhg, Mean Pap 20 mmhg CO 6.8 L/min, CI 3.3 L/min/m2 No pulmonary hypertension. Normal cardiac output  Vascular Ultrasound Lower Extremity Venous 06/09/2019: Right: There is no evidence of deep vein thrombosis in the lower extremity. However, portions of this examination were limited- see technologist comments above. No cystic structure found in the popliteal fossa.  Left: There is no evidence of deep vein thrombosis in the lower extremity. However, portions of this examination were limited- see technologist comments above. No cystic structure found in the popliteal fossa.   PCV ECHOCARDIOGRAM COMPLETE 04/03/2021 Left ventricle cavity is normal in size. Moderate concentric hypertrophy of the left ventricle. Normal global wall motion. Normal LV systolic function with EF 61%. Doppler evidence of grade I (impaired) diastolic dysfunction, normal LAP. Left atrial cavity is mildly dilated. Aortic valve probably trileaflet. No significant calcification seen. No aortic valve regurgitation. Increased velocity and max PG of 26 mmHg across LV/Aortic valve, seems to originate throughout mid cavity and LVOT. No significant valvular stenosis suspected. Mildly restricted mitral valve leaflets. Moderate (Grade III) mitral regurgitation. Moderate tricuspid regurgitation. Estimated pulmonary artery systolic pressure 28  mmHg. Previous study on 03/10/2018 noted estimated PASP  of 45 mmHg.  PCV MYOCARDIAL PERFUSION WITH LEXISCAN 05/02/2021 Nondiagnostic ECG stress. AV paced rhythm and no change with Lexiscan infusion. Myocardial perfusion is normal. LV is small in volume.  Overall LV systolic function is normal without regional wall motion abnormalities. Stress LV EF: 58%. Compared to 02/13/2018, no significant change.  Low risk study.  Echocardiogram 08/13/2021:  1. Left ventricular ejection fraction, by estimation, is >75%. The left  ventricle has hyperdynamic function. The left ventricle has no regional  wall motion abnormalities. There is moderate left ventricular hypertrophy.  Left ventricular diastolic function could not be evaluated.   2. Right ventricular systolic function is normal. The right ventricular  size is normal. There is moderately elevated pulmonary artery systolic  pressure.  3. Left atrial size was severely dilated.   4. Right atrial size was severely dilated.   5. The mitral valve is normal in structure. Mild mitral valve  regurgitation. No evidence of mitral stenosis.   6. Tricuspid valve regurgitation is moderate.   7. The aortic valve is tricuspid. There is mild calcification of the  aortic valve. Aortic valve regurgitation is not visualized. Mild to  moderate aortic valve stenosis. Aortic valve area, by VTI measures 1.18  cm. Aortic valve mean gradient measures  33.0 mmHg. Aortic valve Vmax measures 3.80 m/s.   8. The inferior vena cava is dilated in size with >50% respiratory  variability, suggesting right atrial pressure of 8 mmHg.   Pacemaker   Scheduled  In office pacemaker check 09/29/19: Single (S)/Dual (D)/BV: D Presenting ASVP Pacemaker dependant:  Yes.  ASVP _0 /min. Longevity 6 Years. Magnet rate: >85%. AP 5%, VP 99%. BP NA AMS Episodes 4.  AT/AF burden <1% . Longest 5 min. EGM  AT. Latest 07/16/19. HVR 0,  Lead measurements: Stable. Thoracic impedance:  NA Histogram: Low (L)/normal (N)/high (H)  Normal. Patient activity Low.  Observations: Normal device function. Changes: None  Scheduled Remote pacemaker check 01/05/2021:  There was a 0% cumulative atrial arrhythmia burden.  7 high ventricular rate episodes, EGM = 5-19 beat NSVT. Battery longevity is 5.4 years. RA pacing is 1.3 %, RV pacing is 99.9 %.  Scheduled remote dual-chamber pacemaker transmission 04/06/2021: Longevity >4 years. AP 3%, VP 99%. No mode switches. There were 3 high ventricular rate episodes, brief NSVT. Normal pacemaker function.   EKG   EKG 03/29/2021: Probably AV paced rhythm, no further analysis. No significant change from 11/06/2020.   EKG 11/06/2020: Probably AV paced rhythm. No significant change from 03/31/2020.  Assessment     ICD-10-CM   1. Chronic diastolic heart failure (HCC)  I50.32     2. Pacemaker Medtronic Azure XT DR MRI Dual Chamber Pacemaker 03/25/2018  Z95.0     3. Left ventricular outflow tract obstruction  Q24.8        No orders of the defined types were placed in this encounter.  There are no discontinued medications.  Recommendations:   IEESHA ABBASI is a 75 y.o. Caucasian female with hypertension, CKD now on PD, type 2 DM, hyperlipidemia, s/p Medtronic dual chamber pacemaker for symptomatic high grade AV block, hypothyroidism, anemia, osteopenia, bilateral chronic lower extremity venous insufficiency.  History of Covid-19 pneumonia 12/2019.   Since last visit with her office patient has hydrocolloid temporal artery biopsy, she was also admitted 08/06/2021-08/17/2021 with acute cholecystitis and septic shock.  She was admitted to the ICU requiring pressor support and underwent cholecystectomy 08/09/2021.  During her stay patient had persistent hypotension, therefore was seen by Dr. Einar Gip of reprogrammed her pacemaker, uptitrated midodrine, and added pseudoephedrine, which allowed patient to undergo dialysis and improve blood pressure.   Patient now presents for follow-up.  I personally reviewed records from patient's recent hospitalization.  Blood pressure remains soft, however her symptoms have significantly improved with midodrine and pseudoephedrine, will continue present medications.  Also encourage patient to continue with conservative measures and liberal hydration, she verbalized understanding agreement.  Patient was previously working with our office for evaluation of dyspnea on exertion, however cardiovascular testing has been unyielding as far as etiology, recommend further evaluation with pulmonology when able.  Will not make changes from a cardiovascular standpoint patient is relatively stable.  We will plan to follow-up in 8 weeks for hypotension, hyperlipidemia,  and pacemaker.   Alethia Berthold, PA-C 08/30/2021, 5:00 PM Office: [phone]

## 2021-08-29 ENCOUNTER — Other Ambulatory Visit: Payer: Self-pay

## 2021-08-29 ENCOUNTER — Ambulatory Visit: Payer: Medicare Other | Admitting: Student

## 2021-08-29 ENCOUNTER — Encounter: Payer: Self-pay | Admitting: Student

## 2021-08-29 VITALS — BP 101/50 | HR 70 | Ht 65.0 in | Wt 191.0 lb

## 2021-08-29 DIAGNOSIS — Q248 Other specified congenital malformations of heart: Secondary | ICD-10-CM

## 2021-08-29 DIAGNOSIS — I5032 Chronic diastolic (congestive) heart failure: Secondary | ICD-10-CM

## 2021-08-29 DIAGNOSIS — Z950 Presence of cardiac pacemaker: Secondary | ICD-10-CM

## 2021-09-12 ENCOUNTER — Ambulatory Visit: Payer: Medicare Other | Admitting: Rheumatology

## 2021-09-21 NOTE — Progress Notes (Deleted)
Office Visit Note  Patient: Erika Fry             Date of Birth: 1946/05/06           MRN: [id number]             PCP: [name], MD Referring: Melissa Noon, OD Visit Date: 10/04/2021 Occupation: _0 @  Subjective:  No chief complaint on file.   History of Present Illness: Erika Fry is a 75 y.o. female ***   Activities of Daily Living:  Patient reports morning stiffness for *** {minute/hour:19697}.   Patient {ACTIONS;DENIES/REPORTS:21021675::"Denies"} nocturnal pain.  Difficulty dressing/grooming: {ACTIONS;DENIES/REPORTS:21021675::"Denies"} Difficulty climbing stairs: {ACTIONS;DENIES/REPORTS:21021675::"Denies"} Difficulty getting out of chair: {ACTIONS;DENIES/REPORTS:21021675::"Denies"} Difficulty using hands for taps, buttons, cutlery, and/or writing: {ACTIONS;DENIES/REPORTS:21021675::"Denies"}  No Rheumatology ROS completed.   PMFS History:  Patient Active Problem List   Diagnosis Date Noted   Pain of upper abdomen    Septic shock ([place]) 08/06/2021   Hypotension 08/06/2021   Changes in vision 06/21/2021   Temporal arteritis syndrome ([place]) 06/21/2021   Acidosis 10/06/2020   Disorder of lipoprotein metabolism, unspecified 10/06/2020   Liver disease, unspecified 10/06/2020   Moderate protein-calorie malnutrition ([place]) 10/06/2020   Other abnormal findings in urine 10/06/2020   Other dietary vitamin B12 deficiency anemia 10/06/2020   Other disorders of bilirubin metabolism 10/06/2020   Other disorders of electrolyte and fluid balance, not elsewhere classified 10/06/2020   Other disorders resulting from impaired renal tubular function 10/06/2020   Other long term (current) drug therapy 10/06/2020   Allergy, unspecified, initial encounter 08/21/2020   Anaphylactic shock, unspecified, initial encounter 08/21/2020   Nausea 05/18/2020   Acute respiratory disease due to COVID-19 virus 12/25/2019   Encounter for care of pacemaker 09/07/2019   Mild  protein-calorie malnutrition ([place]) 07/01/2019   Coagulation defect, unspecified ([place]) 06/29/2019   Secondary hyperparathyroidism of renal origin ([place]) 06/29/2019   ESRD on peritoneal dialysis ([place])    Pain in both lower extremities    Hypomagnesemia 06/11/2019   DNR (do not resuscitate) discussion    Cystitis    Acute on chronic diastolic CHF (congestive heart failure) ([place]) 06/10/2019   Chronic diastolic CHF (congestive heart failure) ([place]) 06/09/2019   Cellulitis of lower extremity 06/09/2019   Bladder pain    Hyponatremia    Acute on chronic anemia    Diabetes mellitus type 2 in obese (HCC)    Chronic kidney disease    Drug induced constipation    Neurogenic bladder    Weakness generalized 04/21/2019   Acquired hydronephrosis with ureteropelvic junction (UPJ) obstruction    Pain in joint involving pelvic region and thigh    Dysuria    Palliative care by specialist    AKI (acute kidney injury) ([place]) 04/10/2019   Hydronephrosis 04/09/2019   Interstitial cystitis 04/09/2019   Bilateral leg edema 02/25/2019   Chronic heart failure with preserved ejection fraction ([place]) 02/24/2019   Pacemaker Medtronic Azure XT DR MRI Dual Chamber Pacemaker 03/25/2018 02/24/2019   ARF (acute renal failure) ([place]) 02/10/2019   Peripheral edema    Fever 01/17/2019   Acute lower UTI 01/17/2019   Pressure injury of skin 01/17/2019   Gross hematuria 01/14/2019   Fluid retention    Generalized edema    Hypokalemia    Acute right-sided low back pain with right-sided sciatica    Acute blood loss anemia    Anemia of chronic disease    Thrombocytopenia (HCC)    Diabetes mellitus ([place])    Morbid obesity ([place])  Hypoalbuminemia due to protein-calorie malnutrition (HCC)    Occult blood in stools    Gastric polyp    Lumbar discitis 05/17/2018   Blood loss anemia 05/12/2018   Hyperlipidemia associated with type 2 diabetes mellitus ([place]) 05/10/2018   Yeast UTI 04/30/2018   Chronic kidney disease  (CKD), stage IV (severe) (HCC)    Diet-controlled diabetes mellitus (HCC)    GERD (gastroesophageal reflux disease)    Gout    High cholesterol    Hypothyroidism    Iron deficiency anemia    AV block, Mobitz 2 03/25/2018   Mobitz type 2 second degree AV block 03/20/2018   Exertional dyspnea 03/19/2018   Acute pain of left knee 07/16/2016   Right wrist pain 07/16/2016    Past Medical History:  Diagnosis Date   Arthritis    "knees, thumbs" (03/25/2018)   AV block, Mobitz 2 03/25/2018   CKD (chronic kidney disease), stage IV (HCC)    Complication of anesthesia    Reports had hard time waking up in the past   Diet-controlled diabetes mellitus ([place])    Encounter for care of pacemaker 09/07/2019   End stage renal failure on dialysis ([place])    T, Th, Sat [place]   GERD (gastroesophageal reflux disease)    Gout    "on daily RX" (03/25/2018)   Heart murmur    High cholesterol    History of COVID-19 12/2019   Hypothyroidism    Iron deficiency anemia    "had to get an iron infusion"   Migraine    "used to have them growing up" (03/25/2018)   Pneumonia 12/2019   with COVID   Presence of permanent cardiac pacemaker 03/25/2018   medtronic dual pacemaker: Dr Crissie Sickles    Family History  Problem Relation Age of Onset   Hypertension Mother    Diabetes Mellitus II Father    Heart disease Father    Heart attack Father    Gastric cancer Brother    Diabetes Mellitus II Brother    Stomach cancer Brother    Past Surgical History:  Procedure Laterality Date   APPENDECTOMY     ARTERY BIOPSY Right 06/25/2021   Procedure: TEMPORAL ARTERY BIOPSY RIGHT;  Surgeon: Armandina Gemma, MD;  Location: [facility];  Service: General;  Laterality: Right;  LMA   AV FISTULA PLACEMENT Right 06/29/2019   Procedure: ARTERIOVENOUS (AV) FISTULA CREATION RIGHT UPPER  ARM;  Surgeon: Waynetta Sandy, MD;  Location: [place];  Service: Vascular;  Laterality: Right;   [place] Right  09/08/2019   Procedure: BASILIC VEIN TRANSPOSITION RIGHT SECOND STAGE;  Surgeon: Waynetta Sandy, MD;  Location: [facility] OR;  Service: Vascular;  Laterality: Right;   BIOPSY  05/19/2018   Procedure: BIOPSY;  Surgeon: Ladene Artist, MD;  [facility] END[facility];  Service: Endoscopy;;   CHOLECYSTECTOMY N/A 08/09/2021   Procedure: LAPAROSCOPIC CHOLECYSTECTOMY;  Surgeon: Jesusita Oka, MD;  Location: [place];  Service: General;  Laterality: N/A;   CYSTOSCOPY W/ RETROGRADES Bilateral 01/14/2019   Procedure: CYSTOSCOPY WITH RETROGRADE PYELOGRAM BILATERAL HYDRODISTENTION;  Surgeon: Ardis Hughs, MD;  Location: [facility];  Service: Urology;  Laterality: Bilateral;   ESOPHAGOGASTRODUODENOSCOPY N/A 05/19/2018   Procedure: ESOPHAGOGASTRODUODENOSCOPY (EGD);  Surgeon: Ladene Artist, MD;  Location: [facility] ENDOSCOPY;  Service: Endoscopy;  Laterality: N/A;   ESOPHAGOGASTRODUODENOSCOPY (EGD) WITH PROPOFOL N/A 01/28/2019   Procedure: ESOPHAGOGASTRODUODENOSCOPY (EGD) WITH PROPOFOL;  Surgeon: Rush Landmark Telford Nab., MD;  Location: [place];  Service: Gastroenterology;  Laterality:  N/A;   INSERT / REPLACE / REMOVE PACEMAKER  03/25/2018   IR FLUORO GUIDE CV LINE RIGHT  05/18/2018   IR FLUORO GUIDE CV LINE RIGHT  06/24/2019   IR LUMBAR DISC ASPIRATION W/IMG GUIDE  05/15/2018   IR NEPHROSTOMY EXCHANGE RIGHT  05/28/2019   IR NEPHROSTOMY EXCHANGE RIGHT  07/19/2019   IR NEPHROSTOMY PLACEMENT RIGHT  04/09/2019   IR REMOVAL TUN CV CATH W/O FL  07/23/2018   IR US GUIDE VASC ACCESS RIGHT  05/18/2018   IR US GUIDE VASC ACCESS RIGHT  06/24/2019   KNEE ARTHROSCOPY Bilateral    PACEMAKER IMPLANT N/A 03/25/2018   Procedure: PACEMAKER IMPLANT;  Surgeon: Evans Lance, MD;  Location: [place] CV LAB;  Service: Cardiovascular;  Laterality: N/A;   PACEMAKER PLACEMENT Left 03/2018   POLYPECTOMY  01/28/2019   Procedure: POLYPECTOMY;  Surgeon: Mansouraty, Telford Nab., MD;  Location: [place];  Service: Gastroenterology;;    RIGHT HEART CATH N/A 03/20/2018   Procedure: RIGHT HEART CATH;  Surgeon: Nigel Mormon, MD;  Location: [place] CV LAB;  Service: Cardiovascular;  Laterality: N/A;   TEE WITHOUT CARDIOVERSION N/A 03/20/2018   Procedure: TRANSESOPHAGEAL ECHOCARDIOGRAM (TEE);  Surgeon: Nigel Mormon, MD;  Location: [facility] ENDOSCOPY;  Service: Cardiovascular;  Laterality: N/A;   TONSILLECTOMY     URETEROSCOPY WITH HOLMIUM LASER LITHOTRIPSY Right 01/14/2019   Procedure: URETEROSCOPY WITH HOLMIUM LASER LITHOTRIPSY BLADDER BIOPSIES RIGHT STENT PLACEMENT;  Surgeon: Ardis Hughs, MD;  Location: [facility];  Service: Urology;  Laterality: Right;   Social History   Social History Narrative   Right Handed   Lives in a one story home with basement   Drinks no caffeine    Immunization History  Administered Date(s) Administered   Hepatitis B, adult 07/22/2019, 08/19/2019, 09/23/2019, 01/20/2020   Hepatitis B, ped/adol 07/22/2019, 08/19/2019, 09/23/2019, 01/20/2020   Hepb-cpg 10/10/2020   Influenza Split 08/17/2018   Influenza, High Dose Seasonal PF 08/26/2019   Influenza,inj,Quad PF,6+ Mos 08/17/2018   Influenza-Unspecified 08/26/2019, 08/31/2020   PFIZER(Purple Top)SARS-COV-2 Vaccination 08/20/2020, 09/03/2020   Pneumococcal Conjugate-13 11/02/2019   Pneumococcal Polysaccharide-23 05/01/2018     Objective: Vital Signs: There were no vitals taken for this visit.   Physical Exam   Musculoskeletal Exam: ***  CDAI Exam: CDAI Score: -- Patient Global: --; Provider Global: -- Swollen: --; Tender: -- Joint Exam 10/04/2021   No joint exam has been documented for this visit   There is currently no information documented on the homunculus. Go to the Rheumatology activity and complete the homunculus joint exam.  Investigation: No additional findings.  Imaging: No results found.  Recent Labs: Lab Results  Component Value Date   WBC 11.3 (H) 08/17/2021   HGB 9.2 (L) 08/17/2021   PLT 210  08/17/2021   NA 137 08/17/2021   K 4.2 08/17/2021   CL 97 (L) 08/17/2021   CO2 26 08/17/2021   GLUCOSE 113 (H) 08/17/2021   BUN 21 08/17/2021   CREATININE 5.11 (H) 08/17/2021   BILITOT 0.8 08/13/2021   ALKPHOS 81 08/13/2021   AST 12 (L) 08/13/2021   ALT 13 08/13/2021   PROT 4.7 (L) 08/13/2021   ALBUMIN 3.0 (L) 08/17/2021   CALCIUM 9.6 08/17/2021   GFRAA 6 (L) 02/02/2020    Speciality Comments: No specialty comments available.  Procedures:  No procedures performed Allergies: Baclofen, Tape, Other, Codeine, and Dextromethorphan-guaifenesin   Assessment / Plan:     Visit Diagnoses: Elevated sed rate - ESR 72. CRP 20  Thrombocytopenia (HCC)  Hypokalemia  ESRD on peritoneal dialysis [facility])  Acquired hydronephrosis with ureteropelvic junction (UPJ) obstruction  Lumbar discitis  Septic shock (HCC)  Interstitial cystitis  Temporal arteritis syndrome (HCC)  Mobitz type 2 second degree AV block  Chronic heart failure with preserved ejection fraction (HCC)  History of gastroesophageal reflux (GERD)  Gastric polyp  Secondary hyperparathyroidism of renal origin ([place])  History of hypothyroidism  Hyperlipidemia associated with type 2 diabetes mellitus ([place])  Diabetes mellitus type 2 in obese ([place])  Orders: No orders of the defined types were placed in this encounter.  No orders of the defined types were placed in this encounter.   Face-to-face time spent with patient was *** minutes. Greater than 50% of time was spent in counseling and coordination of care.  Follow-Up Instructions: No follow-ups on file.   Ofilia Neas, PA-C  Note - This record has been created using Dragon software.  Chart creation errors have been sought, but may not always  have been located. Such creation errors do not reflect on  the standard of medical care.,

## 2021-10-04 ENCOUNTER — Ambulatory Visit: Payer: Medicare Other | Admitting: Rheumatology

## 2021-10-04 DIAGNOSIS — A419 Sepsis, unspecified organism: Secondary | ICD-10-CM

## 2021-10-04 DIAGNOSIS — I5032 Chronic diastolic (congestive) heart failure: Secondary | ICD-10-CM

## 2021-10-04 DIAGNOSIS — I441 Atrioventricular block, second degree: Secondary | ICD-10-CM

## 2021-10-04 DIAGNOSIS — N186 End stage renal disease: Secondary | ICD-10-CM

## 2021-10-04 DIAGNOSIS — E876 Hypokalemia: Secondary | ICD-10-CM

## 2021-10-04 DIAGNOSIS — N2581 Secondary hyperparathyroidism of renal origin: Secondary | ICD-10-CM

## 2021-10-04 DIAGNOSIS — N130 Hydronephrosis with ureteropelvic junction obstruction: Secondary | ICD-10-CM

## 2021-10-04 DIAGNOSIS — E669 Obesity, unspecified: Secondary | ICD-10-CM

## 2021-10-04 DIAGNOSIS — M4646 Discitis, unspecified, lumbar region: Secondary | ICD-10-CM

## 2021-10-04 DIAGNOSIS — Z8719 Personal history of other diseases of the digestive system: Secondary | ICD-10-CM

## 2021-10-04 DIAGNOSIS — K317 Polyp of stomach and duodenum: Secondary | ICD-10-CM

## 2021-10-04 DIAGNOSIS — Z8639 Personal history of other endocrine, nutritional and metabolic disease: Secondary | ICD-10-CM

## 2021-10-04 DIAGNOSIS — M316 Other giant cell arteritis: Secondary | ICD-10-CM

## 2021-10-04 DIAGNOSIS — D696 Thrombocytopenia, unspecified: Secondary | ICD-10-CM

## 2021-10-04 DIAGNOSIS — E1169 Type 2 diabetes mellitus with other specified complication: Secondary | ICD-10-CM

## 2021-10-04 DIAGNOSIS — R700 Elevated erythrocyte sedimentation rate: Secondary | ICD-10-CM

## 2021-10-04 DIAGNOSIS — N3010 Interstitial cystitis (chronic) without hematuria: Secondary | ICD-10-CM

## 2021-10-24 ENCOUNTER — Ambulatory Visit: Payer: Medicare Other | Admitting: Student

## 2021-11-01 ENCOUNTER — Encounter: Payer: Self-pay | Admitting: Student

## 2021-11-01 ENCOUNTER — Other Ambulatory Visit: Payer: Self-pay

## 2021-11-01 ENCOUNTER — Ambulatory Visit: Payer: Medicare Other | Admitting: Student

## 2021-11-01 ENCOUNTER — Ambulatory Visit: Payer: Medicare Other | Admitting: Cardiology

## 2021-11-01 VITALS — BP 115/63 | HR 82 | Temp 97.3°F | Resp 16 | Ht 65.0 in | Wt 184.0 lb

## 2021-11-01 DIAGNOSIS — Z950 Presence of cardiac pacemaker: Secondary | ICD-10-CM

## 2021-11-01 DIAGNOSIS — I951 Orthostatic hypotension: Secondary | ICD-10-CM

## 2021-11-01 DIAGNOSIS — I442 Atrioventricular block, complete: Secondary | ICD-10-CM

## 2021-11-01 DIAGNOSIS — R0609 Other forms of dyspnea: Secondary | ICD-10-CM

## 2021-11-01 DIAGNOSIS — Z45018 Encounter for adjustment and management of other part of cardiac pacemaker: Secondary | ICD-10-CM

## 2021-11-01 DIAGNOSIS — I5032 Chronic diastolic (congestive) heart failure: Secondary | ICD-10-CM

## 2021-11-01 MED ORDER — MIDODRINE HCL 10 MG PO TABS: 10.0000 mg | ORAL_TABLET | Freq: Three times a day (TID) | ORAL | 3 refills | Status: DC

## 2021-11-01 NOTE — Progress Notes (Signed)
Chief Complaint  Patient presents with   Pacemaker Check      ICD-10-CM   1. Encounter for care of pacemaker  Z45.018     2. Pacemaker Medtronic Azure XT DR MRI Dual Chamber Pacemaker 03/25/2018  Z95.0     3. Complete heart block (HCC)  I44.2       Remote dual-chamber pacemaker transmission 10/04/2021: AP 21%, VP 98%.  Lead impedance and threshold within normal limits.  Longevity 4 years and 3 months. There were no mode switches.  AT/AF episode <1%.  1 NSVT episode, brief 6 beats.  Normal pacemaker function.  Scheduled  In office pacemaker check 11/01/21:  Single (S)/Dual (D)/BV: D. Presenting ASVP. Pacemaker dependant:  Yes. Underlying VP @30 /min. AP 21%, VP 100%. . AMS Episodes 14.  AT/AF burden <1% EGM AFL . Longest 8 min. Latest 09/16/21. HVR 2. Longest 2Sec. NSVT. Longevity 4 Years. Magnet rate: >85%. Lead measurements: Stable. Histogram: Low (L)/normal (N)/high (H)  Normal. Patient activity Low last 2 months.   Observations: Normal functrion. Changes: None.   Erika Prows, MD, [facility] 11/01/2021, 2:10 PM Office: [phone] Fax: [phone] Pager: [phone]
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
## 2021-11-02 ENCOUNTER — Ambulatory Visit: Payer: Medicare Other | Admitting: Rheumatology

## 2021-11-14 ENCOUNTER — Ambulatory Visit (INDEPENDENT_AMBULATORY_CARE_PROVIDER_SITE_OTHER): Payer: Medicare Other | Admitting: Podiatry

## 2021-11-14 ENCOUNTER — Other Ambulatory Visit: Payer: Self-pay

## 2021-11-14 ENCOUNTER — Encounter: Payer: Self-pay | Admitting: Podiatry

## 2021-11-14 DIAGNOSIS — B351 Tinea unguium: Secondary | ICD-10-CM | POA: Diagnosis not present

## 2021-11-14 DIAGNOSIS — M79675 Pain in left toe(s): Secondary | ICD-10-CM | POA: Diagnosis not present

## 2021-11-14 DIAGNOSIS — M79674 Pain in right toe(s): Secondary | ICD-10-CM | POA: Diagnosis not present

## 2021-11-15 NOTE — Progress Notes (Signed)
Subjective:   Patient ID: Erika Fry, female   DOB: 75 y.o.   MRN: [id number]   HPI Patient presents concerned about severe thickness of the nails of both feet that are tender and possible for her to cut with very dry skin bilateral and states that she does have long-term diabetes relatively poor health does not smoke and is not active   Review of Systems  All other systems reviewed and are negative.      Objective:  Physical Exam Vitals and nursing note reviewed.  Constitutional:      Appearance: She is well-developed.  Pulmonary:     Effort: Pulmonary effort is normal.  Musculoskeletal:        General: Normal range of motion.  Skin:    General: Skin is warm.  Neurological:     Mental Status: She is alert.    Neurovascular status was found to be intact range of motion is mildly diminished muscle strength diminished and I did note some diminishment of sharp dull vibratory.  She has very dry skin but no breakdown of tissue and has thick yellow brittle nailbeds 1-5 both feet that get incurvated and are very hard for her to cut and risks due to her long-term diabetes     Assessment:  Mild diabetic neuropathy with combination of dermatitis and also dry skin and thick yellow brittle nailbeds with probable fungal component and pain 1-5 both feet     Plan:  H&P reviewed all conditions recommended correction of deformity.  At this point I went ahead and I debrided nailbeds 1-5 both feet no iatrogenic bleeding.  I educated her on diabetes and daily inspections of her feet and the type of lubricants to try to use for her feet and answered all questions that were posed to be

## 2021-11-16 ENCOUNTER — Encounter (HOSPITAL_COMMUNITY): Payer: Self-pay

## 2022-02-18 ENCOUNTER — Other Ambulatory Visit: Payer: Self-pay

## 2022-02-18 ENCOUNTER — Encounter: Payer: Self-pay | Admitting: Podiatry

## 2022-02-18 ENCOUNTER — Ambulatory Visit (INDEPENDENT_AMBULATORY_CARE_PROVIDER_SITE_OTHER): Payer: Medicare Other | Admitting: Podiatry

## 2022-02-18 DIAGNOSIS — M79674 Pain in right toe(s): Secondary | ICD-10-CM | POA: Diagnosis not present

## 2022-02-18 DIAGNOSIS — B351 Tinea unguium: Secondary | ICD-10-CM | POA: Diagnosis not present

## 2022-02-18 DIAGNOSIS — E1169 Type 2 diabetes mellitus with other specified complication: Secondary | ICD-10-CM

## 2022-02-18 DIAGNOSIS — M79675 Pain in left toe(s): Secondary | ICD-10-CM

## 2022-02-18 DIAGNOSIS — E669 Obesity, unspecified: Secondary | ICD-10-CM

## 2022-02-18 DIAGNOSIS — Z992 Dependence on renal dialysis: Secondary | ICD-10-CM

## 2022-02-18 DIAGNOSIS — N186 End stage renal disease: Secondary | ICD-10-CM

## 2022-02-18 DIAGNOSIS — N184 Chronic kidney disease, stage 4 (severe): Secondary | ICD-10-CM

## 2022-02-18 NOTE — Progress Notes (Signed)
This patient returns to my office for at risk foot care.  This patient requires this care by a professional since this patient will be at risk due to having diabetes,ESRD thrombocytopenia and coagulation defect.  This patient is unable to cut nails herself since the patient cannot reach her nails.These nails are painful walking and wearing shoes.  This patient presents for at risk foot care today. ? ?General Appearance  Alert, conversant and in no acute stress. ? ?Vascular  Dorsalis pedis and posterior tibial  pulses are palpable  bilaterally.  Capillary return is within normal limits  bilaterally. Temperature is within normal limits  bilaterally. ? ?Neurologic  Senn-Weinstein monofilament wire test within normal limits  bilaterally. Muscle power within normal limits bilaterally. ? ?Nails Thick disfigured discolored nails with subungual debris  from hallux to fifth toes bilaterally. No evidence of bacterial infection or drainage bilaterally. ? ?Orthopedic  No limitations of motion  feet .  No crepitus or effusions noted.  No bony pathology or digital deformities noted. ? ?Skin  normotropic skin with no porokeratosis noted bilaterally.  No signs of infections or ulcers noted.    ? ?Onychomycosis  Pain in right toes  Pain in left toes ? ?Consent was obtained for treatment procedures.   Mechanical debridement of nails 1-5  bilaterally performed with a nail nipper.  Filed with dremel without incident.  ? ? ?Return office visit   3 months                   Told patient to return for periodic foot care and evaluation due to potential at risk complications. ? ? ?Gardiner Barefoot DPM   ?

## 2022-04-22 ENCOUNTER — Emergency Department (HOSPITAL_BASED_OUTPATIENT_CLINIC_OR_DEPARTMENT_OTHER)
Admission: EM | Admit: 2022-04-22 | Discharge: 2022-04-22 | Disposition: A | Discharge status: Home / Self Care | Payer: Medicare Other | Attending: Emergency Medicine | Admitting: Emergency Medicine

## 2022-04-22 ENCOUNTER — Encounter (HOSPITAL_BASED_OUTPATIENT_CLINIC_OR_DEPARTMENT_OTHER): Payer: Self-pay | Admitting: Emergency Medicine

## 2022-04-22 ENCOUNTER — Emergency Department (HOSPITAL_BASED_OUTPATIENT_CLINIC_OR_DEPARTMENT_OTHER): Payer: Medicare Other

## 2022-04-22 ENCOUNTER — Other Ambulatory Visit: Payer: Self-pay

## 2022-04-22 DIAGNOSIS — B379 Candidiasis, unspecified: Secondary | ICD-10-CM | POA: Diagnosis not present

## 2022-04-22 DIAGNOSIS — R102 Pelvic and perineal pain: Secondary | ICD-10-CM | POA: Diagnosis present

## 2022-04-22 MED ORDER — FLUCONAZOLE 150 MG PO TABS
150.0000 mg | ORAL_TABLET | Freq: Once | ORAL | Status: AC
  Administered 2022-04-22: 150 mg via ORAL
  Filled 2022-04-22: qty 1

## 2022-04-22 MED ORDER — HYDROCODONE-ACETAMINOPHEN 5-325 MG PO TABS: 1.0000 | ORAL_TABLET | Freq: Four times a day (QID) | ORAL | 0 refills | Status: AC | PRN

## 2022-04-22 MED ORDER — HYDROCODONE-ACETAMINOPHEN 5-325 MG PO TABS
1.0000 | ORAL_TABLET | Freq: Once | ORAL | Status: AC
  Administered 2022-04-22: 1 via ORAL
  Filled 2022-04-22: qty 1

## 2022-04-22 MED ORDER — FLUCONAZOLE 200 MG PO TABS: 200.0000 mg | ORAL_TABLET | Freq: Once | ORAL | Status: DC

## 2022-04-22 MED ORDER — FLUCONAZOLE 150 MG PO TABS: 150.0000 mg | ORAL_TABLET | Freq: Every day | ORAL | 0 refills | Status: AC

## 2022-04-22 NOTE — ED Notes (Signed)
Reviewed AVS/discharge instruction with patient. Time allotted for and all questions answered. Patient is agreeable for d/c and escorted to ed exit by staff.  

## 2022-04-22 NOTE — ED Provider Notes (Signed)
[facility] EMERGENCY DEPT Provider Note   CSN: [id number] Arrival date & time: 04/22/22  1523     History {Add pertinent medical, surgical, social history, OB history to HPI:1} Chief Complaint  Patient presents with   Urinary Urgency   Vaginal Discomfort    Erika Fry is a 76 y.o. female on peritoneal dialysis presenting to ED with report of pressure in her lower abdomen in her vagina.  She reports this been ongoing for 3 weeks been nearly constant, sensation that she has to pee.  She reports she does not typically make urine other than "a few drops" because of her kidney disease.  She has not produced any urine at all recently.  She has a history of interstitial cystitis in the past but says this feels different to her.  She denies history of kidney stones.  She reports her pain was improved on Norco prescribed by her doctor but she has nearly run out.  She denies constipation, takes laxatives daily and has regular bowel movements.  She denies fevers or chills.  HPI     Home Medications Prior to Admission medications   Medication Sig Start Date End Date Taking? Authorizing Provider  acetaminophen (TYLENOL) 325 MG tablet Take 650 mg by mouth every 6 (six) hours as needed for moderate pain or headache.    [provider]  albuterol (VENTOLIN HFA) 108 (90 Base) MCG/ACT inhaler INHALE 2 PUFFS BY MOUTH EVERY 4 TO 6 HOURS AS NEEDED FOR WHEEZING, SEE ADMIN INSTRUCTIONS Patient taking differently: Inhale 2 puffs into the lungs every 4 (four) hours as needed for shortness of breath or wheezing. 02/12/21   Adrian Prows, MD  B Complex-C-Zn-Folic Acid (DIALYVITE 767 WITH ZINC) 0.8 MG TABS Take 1 tablet by mouth daily.  07/01/19   [provider]  Biotin 10000 MCG TABS Take 10,000 mcg by mouth daily with breakfast.    [provider]  cetirizine (ZYRTEC) 10 MG tablet Take 10 mg by mouth at bedtime.     [provider]  cinacalcet (SENSIPAR) 60  MG tablet Take 60 mg by mouth 2 (two) times a week. Mon friday    [provider]  Cyanocobalamin (VITAMIN B12) 1000 MCG TBCR Take 1,000 mcg by mouth daily.    [provider]  escitalopram (LEXAPRO) 5 MG tablet Take 5 mg by mouth daily. 07/25/21   [provider]  feeding supplement (ENSURE ENLIVE / ENSURE PLUS) LIQD Take 237 mLs by mouth daily. 08/17/21   Ghimire, Henreitta Leber, MD  ferric citrate (AURYXIA) 1 GM 210 MG(Fe) tablet Take 420 mg by mouth 3 (three) times daily with meals.     [provider]  fluticasone (FLONASE) 50 MCG/ACT nasal spray Place 2 sprays into both nostrils daily as needed for allergies or rhinitis.    [provider]  gentamicin cream (GARAMYCIN) 0.1 % Apply 1 application topically daily. Apply to exit site. 08/17/21   Ghimire, Henreitta Leber, MD  levothyroxine (SYNTHROID) 75 MCG tablet Take 1 tablet (75 mcg total) by mouth daily before breakfast. 04/30/19   Love, Ivan Anchors, PA-C  midodrine (PROAMATINE) 10 MG tablet Take 1 tablet (10 mg total) by mouth 3 (three) times daily. 11/01/21   Cantwell, Celeste C, PA-C  omeprazole (PRILOSEC) 20 MG capsule TAKE 1 CAPSULE BY MOUTH ONCE DAILY BEFORE BREAKFAST Patient taking differently: Take 20 mg by mouth daily. 05/17/21   Cantwell, Celeste C, PA-C  oxyCODONE (OXY IR/ROXICODONE) 5 MG immediate release tablet Take 1  tablet (5 mg total) by mouth every 6 (six) hours as needed for moderate pain. 08/17/21   Ghimire, Henreitta Leber, MD  pseudoephedrine (SUDAFED) 30 MG tablet Take 1 tablet (30 mg total) by mouth 2 (two) times daily. 08/17/21   Ghimire, Henreitta Leber, MD  senna (SENOKOT) 8.6 MG TABS tablet Take 1 tablet by mouth at bedtime.    [provider]  simvastatin (ZOCOR) 40 MG tablet Take 1 tablet (40 mg total) by mouth at bedtime. 04/21/19   Dessa Phi, DO      Allergies    Baclofen, Tape, Other, Codeine, and Dextromethorphan-guaifenesin    Review of Systems   Review of Systems  Physical  Exam Updated Vital Signs BP 110/61   Pulse 76   Temp 97.9 F (36.6 C)   Resp 17   Ht '5\' 5"'$  (1.651 m)   Wt 81.7 kg   SpO2 99%   BMI 29.99 kg/m  Physical Exam Constitutional:      General: She is not in acute distress. HENT:     Head: Normocephalic and atraumatic.  Eyes:     Conjunctiva/sclera: Conjunctivae normal.     Pupils: Pupils are equal, round, and reactive to light.  Cardiovascular:     Rate and Rhythm: Normal rate and regular rhythm.  Pulmonary:     Effort: Pulmonary effort is normal. No respiratory distress.  Abdominal:     General: There is no distension.     Tenderness: There is no abdominal tenderness.  Skin:    General: Skin is warm and dry.  Neurological:     General: No focal deficit present.     Mental Status: She is alert. Mental status is at baseline.  Psychiatric:        Mood and Affect: Mood normal.        Behavior: Behavior normal.    ED Results / Procedures / Treatments   Labs (all labs ordered are listed, but only abnormal results are displayed) Labs Reviewed  COMPREHENSIVE METABOLIC PANEL  CBC WITH DIFFERENTIAL/PLATELET  URINALYSIS, ROUTINE W REFLEX MICROSCOPIC    EKG None  Radiology No results found.  Procedures Procedures  {Document cardiac monitor, telemetry assessment procedure when appropriate:1}  Medications Ordered in ED Medications - No data to display  ED Course/ Medical Decision Making/ A&P                           Medical Decision Making Amount and/or Complexity of Data Reviewed Labs: ordered.   Patient is here with vaginal discomfort and sensation of urinary pressure.  Differential diagnosis would include kidney stone versus UTI versus cystitis versus vaginal prolapse versus other.  She is well-appearing and does not show signs of sepsis or other intra-abdominal infection or inflammation.  She is status post appendectomy and cholecystectomy per her report.  I do not believe we need blood test at this  time {Document critical care time when appropriate:1} {Document review of labs and clinical decision tools ie heart score, Chads2Vasc2 etc:1}  {Document your independent review of radiology images, and any outside records:1} {Document your discussion with family members, caretakers, and with consultants:1} {Document social determinants of health affecting pt's care:1} {Document your decision making why or why not admission, treatments were needed:1} Final Clinical Impression(s) / ED Diagnoses Final diagnoses:  None    Rx / DC Orders ED Discharge Orders     None

## 2022-04-22 NOTE — ED Triage Notes (Signed)
Pt arrives to ED with c/o vaginal discomfort, oliguria, and urinary urgency. Pt reports over the past three weeks she has experienced urinary urgency, and a "pressure" like sensation in her vagina. However she reports she is experiencing oliguria, and has not been urinating at all for the past three weeks. She is on peritoneal dialysis and usually urinates multiple times a day.

## 2022-04-29 ENCOUNTER — Encounter (HOSPITAL_BASED_OUTPATIENT_CLINIC_OR_DEPARTMENT_OTHER): Payer: Self-pay

## 2022-04-29 ENCOUNTER — Emergency Department (HOSPITAL_BASED_OUTPATIENT_CLINIC_OR_DEPARTMENT_OTHER)
Admission: EM | Admit: 2022-04-29 | Discharge: 2022-04-29 | Disposition: A | Discharge status: Home / Self Care | Payer: Medicare Other | Attending: Emergency Medicine | Admitting: Emergency Medicine

## 2022-04-29 ENCOUNTER — Other Ambulatory Visit: Payer: Self-pay

## 2022-04-29 DIAGNOSIS — N952 Postmenopausal atrophic vaginitis: Secondary | ICD-10-CM | POA: Insufficient documentation

## 2022-04-29 DIAGNOSIS — B3731 Acute candidiasis of vulva and vagina: Secondary | ICD-10-CM | POA: Insufficient documentation

## 2022-04-29 DIAGNOSIS — N184 Chronic kidney disease, stage 4 (severe): Secondary | ICD-10-CM | POA: Insufficient documentation

## 2022-04-29 DIAGNOSIS — B3749 Other urogenital candidiasis: Secondary | ICD-10-CM

## 2022-04-29 DIAGNOSIS — R350 Frequency of micturition: Secondary | ICD-10-CM | POA: Diagnosis present

## 2022-04-29 LAB — WET PREP, GENITAL
Clue Cells Wet Prep HPF POC: NONE SEEN
Sperm: NONE SEEN
Trich, Wet Prep: NONE SEEN
WBC, Wet Prep HPF POC: >=10 — AB (ref <10)

## 2022-04-29 MED ORDER — HYDROCODONE-ACETAMINOPHEN 5-325 MG PO TABS
1.0000 | ORAL_TABLET | Freq: Once | ORAL | Status: AC
  Administered 2022-04-29: 1 via ORAL
  Filled 2022-04-29: qty 1

## 2022-04-29 MED ORDER — FLUCONAZOLE 150 MG PO TABS
150.0000 mg | ORAL_TABLET | Freq: Once | ORAL | Status: AC
Start: 2022-04-29 — End: 2022-04-29
  Administered 2022-04-29: 150 mg via ORAL
  Filled 2022-04-29: qty 1

## 2022-04-29 MED ORDER — FLUCONAZOLE 150 MG PO TABS
ORAL_TABLET | ORAL | 0 refills | Status: DC
Start: 2022-04-29 — End: 2022-07-15

## 2022-04-29 NOTE — ED Triage Notes (Signed)
Patient here POV from Home.  Endorses Vaginal Discomfort and Urinary Frequency/Oliguria for approximately 4 weeks. States she was seen in ED 1 Week ago but still endorses Symptoms despite Interventions.   Endorses History of CKD and performs PD nightly. No Changes in Regimen or Output. No Fevers. No Nausea but an Episode of Emesis yesterday. No Diarrhea.  NAD Noted during Triage. A&Ox4. GCS 15. BIB Wheelchair.

## 2022-04-29 NOTE — ED Notes (Signed)
Dc instructions reviewed with patient. Patient voiced understanding. Dc with belongings.  °

## 2022-04-29 NOTE — Discharge Instructions (Signed)
It was a pleasure taking care of you today!  Some notable for a yeast infection today.  You will be sent a prescription for Diflucan, take as directed.  Attached is information for the on-call OB/GYN group, call and set up a follow-up appointment tomorrow regarding today's ED visit.  Ensure to ask about topical estrogen.  You may follow-up with your primary care provider as needed.  Return to the emergency department for experiencing increasing/worsening pain, nausea, vomiting, vaginal discharge, worsening symptoms.

## 2022-04-29 NOTE — ED Provider Notes (Signed)
[facility] EMERGENCY DEPT Provider Note   CSN: [id number] Arrival date & time: 04/29/22  1237     History  Chief Complaint  Patient presents with   Urinary Frequency    Erika Fry is a 76 y.o. female with a PMHx of CKD stage IV who presents to the ED complaining of vaginal discomfort onset 3-4 weeks. She notes that she has had this pain for several years. She has been going to pain managements and treated with hydrocodone q 4 hours. Has been evaluated by her kidney doctors, urologist, and primary care doctor.  She notes that she does not make urine anymore.  She has peritoneal dialysis that she completes every night.   The history is provided by the patient. No language interpreter was used.      Home Medications Prior to Admission medications   Medication Sig Start Date End Date Taking? Authorizing Provider  fluconazole (DIFLUCAN) 150 MG tablet Take 1 tablet (150 mg total) by mouth daily for 1 dose. Take 5 days from now. 04/29/22  Yes Ayaansh Smail A, PA-C  acetaminophen (TYLENOL) 325 MG tablet Take 650 mg by mouth every 6 (six) hours as needed for moderate pain or headache.    [provider]  albuterol (VENTOLIN HFA) 108 (90 Base) MCG/ACT inhaler INHALE 2 PUFFS BY MOUTH EVERY 4 TO 6 HOURS AS NEEDED FOR WHEEZING, SEE ADMIN INSTRUCTIONS Patient taking differently: Inhale 2 puffs into the lungs every 4 (four) hours as needed for shortness of breath or wheezing. 02/12/21   Adrian Prows, MD  B Complex-C-Zn-Folic Acid (DIALYVITE 924 WITH ZINC) 0.8 MG TABS Take 1 tablet by mouth daily.  07/01/19   [provider]  Biotin 10000 MCG TABS Take 10,000 mcg by mouth daily with breakfast.    [provider]  cetirizine (ZYRTEC) 10 MG tablet Take 10 mg by mouth at bedtime.     [provider]  cinacalcet (SENSIPAR) 60 MG tablet Take 60 mg by mouth 2 (two) times a week. Mon friday    [provider]  Cyanocobalamin (VITAMIN B12) 1000  MCG TBCR Take 1,000 mcg by mouth daily.    [provider]  escitalopram (LEXAPRO) 5 MG tablet Take 5 mg by mouth daily. 07/25/21   [provider]  feeding supplement (ENSURE ENLIVE / ENSURE PLUS) LIQD Take 237 mLs by mouth daily. 08/17/21   Ghimire, Henreitta Leber, MD  ferric citrate (AURYXIA) 1 GM 210 MG(Fe) tablet Take 420 mg by mouth 3 (three) times daily with meals.     [provider]  fluticasone (FLONASE) 50 MCG/ACT nasal spray Place 2 sprays into both nostrils daily as needed for allergies or rhinitis.    [provider]  gentamicin cream (GARAMYCIN) 0.1 % Apply 1 application topically daily. Apply to exit site. 08/17/21   Ghimire, Henreitta Leber, MD  HYDROcodone-acetaminophen (NORCO/VICODIN) 5-325 MG tablet Take 1 tablet by mouth every 6 (six) hours as needed for up to 20 doses for severe pain. 04/22/22   Wyvonnia Dusky, MD  levothyroxine (SYNTHROID) 75 MCG tablet Take 1 tablet (75 mcg total) by mouth daily before breakfast. 04/30/19   Love, Ivan Anchors, PA-C  midodrine (PROAMATINE) 10 MG tablet Take 1 tablet (10 mg total) by mouth 3 (three) times daily. 11/01/21   Cantwell, Celeste C, PA-C  omeprazole (PRILOSEC) 20 MG capsule TAKE 1 CAPSULE BY MOUTH ONCE DAILY BEFORE BREAKFAST Patient taking differently: Take 20 mg by mouth daily. 05/17/21   Cantwell, Gerline Legacy,  PA-C  oxyCODONE (OXY IR/ROXICODONE) 5 MG immediate release tablet Take 1 tablet (5 mg total) by mouth every 6 (six) hours as needed for moderate pain. 08/17/21   Ghimire, Henreitta Leber, MD  pseudoephedrine (SUDAFED) 30 MG tablet Take 1 tablet (30 mg total) by mouth 2 (two) times daily. 08/17/21   Ghimire, Henreitta Leber, MD  senna (SENOKOT) 8.6 MG TABS tablet Take 1 tablet by mouth at bedtime.    [provider]  simvastatin (ZOCOR) 40 MG tablet Take 1 tablet (40 mg total) by mouth at bedtime. 04/21/19   Dessa Phi, DO      Allergies    Baclofen, Tape, Other, Codeine, and Dextromethorphan-guaifenesin     Review of Systems   Review of Systems  Constitutional:  Negative for chills and fever.  Gastrointestinal:  Positive for vomiting (resolved). Negative for abdominal pain and nausea.  Genitourinary:  Positive for frequency and vaginal discharge. Negative for vaginal bleeding.  All other systems reviewed and are negative.  Physical Exam Updated Vital Signs BP (!) 110/55   Pulse 67   Temp 97.8 F (36.6 C)   Resp 16   Ht '5\' 5"'$  (1.651 m)   Wt 81.7 kg   SpO2 100%   BMI 29.97 kg/m  Physical Exam Vitals and nursing note reviewed. Exam conducted with a chaperone present.  Constitutional:      General: She is not in acute distress.    Appearance: Normal appearance. She is not ill-appearing, toxic-appearing or diaphoretic.  HENT:     Head: Normocephalic and atraumatic.     Right Ear: External ear normal.     Left Ear: External ear normal.  Eyes:     General: No scleral icterus.    Extraocular Movements: Extraocular movements intact.  Cardiovascular:     Rate and Rhythm: Normal rate and regular rhythm.     Pulses: Normal pulses.     Heart sounds: Normal heart sounds.  Pulmonary:     Effort: Pulmonary effort is normal. No respiratory distress.     Breath sounds: Normal breath sounds.  Abdominal:     General: Abdomen is flat. Bowel sounds are normal. There is no distension.     Palpations: Abdomen is soft. There is no mass.     Tenderness: There is no abdominal tenderness.     Hernia: There is no hernia in the left inguinal area or right inguinal area.  Genitourinary:    Pubic Area: No rash.      Labia:        Right: No rash, tenderness, lesion or injury.        Left: No rash, tenderness, lesion or injury.      Vagina: No signs of injury and foreign body. Vaginal discharge present. No erythema, tenderness or bleeding.     Cervix: Normal.     Uterus: Normal. Not deviated, not enlarged, not fixed and not tender.      Adnexa: Right adnexa normal and left adnexa normal.      Comments: RN chaperone, Levada Dy present for exam.  Off-white vaginal discharge noted on exam. Musculoskeletal:        General: Normal range of motion.     Cervical back: Normal range of motion and neck supple.  Lymphadenopathy:     Lower Body: No right inguinal adenopathy. No left inguinal adenopathy.  Skin:    General: Skin is warm and dry.  Neurological:     Mental Status: She is alert.    ED Results /  Procedures / Treatments   Labs (all labs ordered are listed, but only abnormal results are displayed) Labs Reviewed  WET PREP, GENITAL - Abnormal; Notable for the following components:      Result Value   Yeast Wet Prep HPF POC PRESENT (*)    WBC, Wet Prep HPF POC >=10 (*)    All other components within normal limits    EKG None  Radiology No results found.  Procedures Procedures    Medications Ordered in ED Medications  HYDROcodone-acetaminophen (NORCO/VICODIN) 5-325 MG per tablet 1 tablet (1 tablet Oral Given 04/29/22 1716)  fluconazole (DIFLUCAN) tablet 150 mg (150 mg Oral Given 04/29/22 1815)    ED Course/ Medical Decision Making/ A&P Clinical Course as of 04/30/22 0009  Mon Apr 29, 2022  1728 Yeast Wet Prep HPF POC(!): PRESENT [SB]  1805 Re-evaluated and discussed lab findings and discharge treatment plan.  Answered all available questions.  Patient appears safe for discharge at this time. [SB]    Clinical Course User Index [SB] Onix Jumper A, PA-C                           Medical Decision Making Amount and/or Complexity of Data Reviewed Labs: ordered. Decision-making details documented in ED Course.  Risk Prescription drug management.   Pt presents with vaginal discomfort onset 4 weeks.  History of similar symptoms and evaluated in the ED 1 week ago.  Patient does not have an OB/GYN specialist.  Vital signs stable, patient afebrile, not tachycardic or hypoxic.  On exam, pt with no acute cardiovascular, respiratory, downtime findings.  RN chaperone Levada Dy  present for GU exam notable for off-white vaginal discharge. No acute cardiovascular, respiratory, abdominal exam findings. Differential diagnosis includes yeast, bacterial vaginosis, STI, atrophic vaginitis.   Co morbidities that complicate the patient evaluation: Peritoneal dialysis   Additional history obtained:  External records from outside source obtained and reviewed including: Patient was evaluated in the emergency department on 04/22/2022  Labs:  I ordered, and personally interpreted labs.  The pertinent results include:   Wet prep notable for yeast  Medications:  I ordered medication including fluconazole and Norco for symptom management and treatment of yeast Reevaluation of the patient after these medicines and interventions, I reevaluated the patient and found that they have improved I have reviewed the patients home medicines and have made adjustments as needed    Disposition: Presentation suspicious for candidiasis of genitalia.  Also suspicious for atrophic vaginitis.  Doubt acute UTI at this time, patient does not make urine.  Doubt STI at this time.  After consideration of the diagnostic results and the patients response to treatment, I feel that the patient would benefit from Discharge home.  Patient given information for on-call OB/GYN specialist to follow-up and set up appointment regarding today's ED visit.  Prescription for Diflucan sent.  Supportive care measures and strict return precautions discussed with patient at bedside. Pt acknowledges and verbalizes understanding. Pt appears safe for discharge. Follow up as indicated in discharge paperwork.    This chart was dictated using voice recognition software, Dragon. Despite the best efforts of this provider to proofread and correct errors, errors may still occur which can change documentation meaning.  Final Clinical Impression(s) / ED Diagnoses Final diagnoses:  Candidiasis of genitalia  Atrophic vaginitis     Rx / DC Orders ED Discharge Orders          Ordered    fluconazole (DIFLUCAN)  150 MG tablet        04/29/22 1812              Fallou Hulbert A, PA-C 82/51/89 8421    Lianne Cure, DO 02/11/80 2327

## 2022-04-29 NOTE — ED Notes (Signed)
2 days after yeast treatment. Pt had oral surgery and tooth infected and had antibiotics, so wondering if yeast infection is back from antibiotic use.

## 2022-04-29 NOTE — ED Notes (Signed)
Pt does not make urine.
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
## 2022-05-02 ENCOUNTER — Ambulatory Visit: Payer: Medicare Other | Admitting: Student

## 2022-05-02 ENCOUNTER — Encounter: Payer: Self-pay | Admitting: Student

## 2022-05-02 VITALS — BP 91/46 | HR 70 | Resp 14 | Ht 65.0 in | Wt 172.0 lb

## 2022-05-02 DIAGNOSIS — I951 Orthostatic hypotension: Secondary | ICD-10-CM

## 2022-05-02 DIAGNOSIS — I5032 Chronic diastolic (congestive) heart failure: Secondary | ICD-10-CM

## 2022-05-02 DIAGNOSIS — Z950 Presence of cardiac pacemaker: Secondary | ICD-10-CM

## 2022-05-02 MED ORDER — MIDODRINE HCL 10 MG PO TABS: 10.0000 mg | ORAL_TABLET | Freq: Three times a day (TID) | ORAL | 3 refills | Status: AC

## 2022-05-09 ENCOUNTER — Ambulatory Visit: Payer: Medicare Other | Admitting: Student

## 2022-05-09 ENCOUNTER — Encounter: Payer: Self-pay | Admitting: Student

## 2022-05-09 VITALS — BP 109/58 | HR 62 | Temp 97.5°F | Resp 16 | Ht 65.0 in | Wt 167.8 lb

## 2022-05-09 DIAGNOSIS — I951 Orthostatic hypotension: Secondary | ICD-10-CM

## 2022-05-09 MED ORDER — EZETIMIBE 10 MG PO TABS: 10.0000 mg | ORAL_TABLET | Freq: Every day | ORAL | 3 refills | Status: AC
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
## 2022-05-20 ENCOUNTER — Ambulatory Visit: Payer: Medicare Other | Admitting: Podiatry

## 2022-05-20 ENCOUNTER — Encounter: Payer: Self-pay | Admitting: Podiatry

## 2022-05-20 ENCOUNTER — Ambulatory Visit (INDEPENDENT_AMBULATORY_CARE_PROVIDER_SITE_OTHER): Payer: Medicare Other | Admitting: Podiatry

## 2022-05-20 DIAGNOSIS — N184 Chronic kidney disease, stage 4 (severe): Secondary | ICD-10-CM

## 2022-05-20 DIAGNOSIS — N186 End stage renal disease: Secondary | ICD-10-CM | POA: Diagnosis not present

## 2022-05-20 DIAGNOSIS — E119 Type 2 diabetes mellitus without complications: Secondary | ICD-10-CM

## 2022-05-20 DIAGNOSIS — E1169 Type 2 diabetes mellitus with other specified complication: Secondary | ICD-10-CM | POA: Diagnosis not present

## 2022-05-20 DIAGNOSIS — M79675 Pain in left toe(s): Secondary | ICD-10-CM

## 2022-05-20 DIAGNOSIS — B351 Tinea unguium: Secondary | ICD-10-CM | POA: Diagnosis not present

## 2022-05-20 DIAGNOSIS — E669 Obesity, unspecified: Secondary | ICD-10-CM

## 2022-05-20 DIAGNOSIS — Z992 Dependence on renal dialysis: Secondary | ICD-10-CM

## 2022-05-20 DIAGNOSIS — M79674 Pain in right toe(s): Secondary | ICD-10-CM

## 2022-05-20 NOTE — Progress Notes (Signed)
This patient returns to my office for at risk foot care.  This patient requires this care by a professional since this patient will be at risk due to having diabetes,ESRD thrombocytopenia and coagulation defect.  This patient is unable to cut nails herself since the patient cannot reach her nails.These nails are painful walking and wearing shoes.  This patient presents for at risk foot care today.  General Appearance  Alert, conversant and in no acute stress.  Vascular  Dorsalis pedis and posterior tibial  pulses are palpable  bilaterally.  Capillary return is within normal limits  bilaterally. Temperature is within normal limits  bilaterally.  Neurologic  Senn-Weinstein monofilament wire test within normal limits  bilaterally. Muscle power within normal limits bilaterally.  Nails Thick disfigured discolored nails with subungual debris  from hallux to fifth toes bilaterally. No evidence of bacterial infection or drainage bilaterally.  Orthopedic  No limitations of motion  feet .  No crepitus or effusions noted.  No bony pathology or digital deformities noted.  Skin  normotropic skin with no porokeratosis noted bilaterally.  No signs of infections or ulcers noted.     Onychomycosis  Pain in right toes  Pain in left toes  Consent was obtained for treatment procedures.   Mechanical debridement of nails 1-5  bilaterally performed with a nail nipper.  Dremel  was not used due to nail polish   Return office visit  4   months                   Told patient to return for periodic foot care and evaluation due to potential at risk complications.   Gardiner Barefoot DPM

## 2022-07-13 ENCOUNTER — Inpatient Hospital Stay (HOSPITAL_COMMUNITY)
Admission: EM | Admit: 2022-07-13 | Discharge: 2022-08-02 | DRG: 871 | Disposition: E | Payer: Medicare Other | Attending: Internal Medicine | Admitting: Internal Medicine

## 2022-07-13 ENCOUNTER — Emergency Department (HOSPITAL_COMMUNITY): Payer: Medicare Other

## 2022-07-13 DIAGNOSIS — I509 Heart failure, unspecified: Secondary | ICD-10-CM | POA: Diagnosis not present

## 2022-07-13 DIAGNOSIS — E871 Hypo-osmolality and hyponatremia: Secondary | ICD-10-CM | POA: Diagnosis present

## 2022-07-13 DIAGNOSIS — I872 Venous insufficiency (chronic) (peripheral): Secondary | ICD-10-CM | POA: Diagnosis present

## 2022-07-13 DIAGNOSIS — K5900 Constipation, unspecified: Secondary | ICD-10-CM | POA: Diagnosis not present

## 2022-07-13 DIAGNOSIS — Z992 Dependence on renal dialysis: Secondary | ICD-10-CM | POA: Diagnosis not present

## 2022-07-13 DIAGNOSIS — R571 Hypovolemic shock: Secondary | ICD-10-CM | POA: Diagnosis present

## 2022-07-13 DIAGNOSIS — T85898A Other specified complication of other internal prosthetic devices, implants and grafts, initial encounter: Secondary | ICD-10-CM | POA: Diagnosis not present

## 2022-07-13 DIAGNOSIS — I080 Rheumatic disorders of both mitral and aortic valves: Secondary | ICD-10-CM | POA: Diagnosis present

## 2022-07-13 DIAGNOSIS — K219 Gastro-esophageal reflux disease without esophagitis: Secondary | ICD-10-CM | POA: Diagnosis present

## 2022-07-13 DIAGNOSIS — M898X9 Other specified disorders of bone, unspecified site: Secondary | ICD-10-CM | POA: Diagnosis present

## 2022-07-13 DIAGNOSIS — T8571XA Infection and inflammatory reaction due to peritoneal dialysis catheter, initial encounter: Secondary | ICD-10-CM | POA: Diagnosis present

## 2022-07-13 DIAGNOSIS — M1990 Unspecified osteoarthritis, unspecified site: Secondary | ICD-10-CM | POA: Diagnosis not present

## 2022-07-13 DIAGNOSIS — E669 Obesity, unspecified: Secondary | ICD-10-CM | POA: Diagnosis present

## 2022-07-13 DIAGNOSIS — Z79899 Other long term (current) drug therapy: Secondary | ICD-10-CM

## 2022-07-13 DIAGNOSIS — Z888 Allergy status to other drugs, medicaments and biological substances status: Secondary | ICD-10-CM

## 2022-07-13 DIAGNOSIS — Y828 Other medical devices associated with adverse incidents: Secondary | ICD-10-CM | POA: Diagnosis not present

## 2022-07-13 DIAGNOSIS — Z6830 Body mass index (BMI) 30.0-30.9, adult: Secondary | ICD-10-CM

## 2022-07-13 DIAGNOSIS — R102 Pelvic and perineal pain: Secondary | ICD-10-CM | POA: Diagnosis not present

## 2022-07-13 DIAGNOSIS — E1122 Type 2 diabetes mellitus with diabetic chronic kidney disease: Secondary | ICD-10-CM | POA: Diagnosis present

## 2022-07-13 DIAGNOSIS — A419 Sepsis, unspecified organism: Secondary | ICD-10-CM | POA: Diagnosis present

## 2022-07-13 DIAGNOSIS — Z950 Presence of cardiac pacemaker: Secondary | ICD-10-CM | POA: Diagnosis not present

## 2022-07-13 DIAGNOSIS — Z79890 Hormone replacement therapy (postmenopausal): Secondary | ICD-10-CM

## 2022-07-13 DIAGNOSIS — E039 Hypothyroidism, unspecified: Secondary | ICD-10-CM | POA: Diagnosis present

## 2022-07-13 DIAGNOSIS — J9601 Acute respiratory failure with hypoxia: Secondary | ICD-10-CM | POA: Diagnosis not present

## 2022-07-13 DIAGNOSIS — I472 Ventricular tachycardia, unspecified: Secondary | ICD-10-CM | POA: Diagnosis not present

## 2022-07-13 DIAGNOSIS — E1169 Type 2 diabetes mellitus with other specified complication: Secondary | ICD-10-CM | POA: Diagnosis present

## 2022-07-13 DIAGNOSIS — Z800 Family history of malignant neoplasm of digestive organs: Secondary | ICD-10-CM

## 2022-07-13 DIAGNOSIS — D631 Anemia in chronic kidney disease: Secondary | ICD-10-CM | POA: Diagnosis present

## 2022-07-13 DIAGNOSIS — I442 Atrioventricular block, complete: Secondary | ICD-10-CM | POA: Diagnosis present

## 2022-07-13 DIAGNOSIS — I4891 Unspecified atrial fibrillation: Secondary | ICD-10-CM | POA: Diagnosis not present

## 2022-07-13 DIAGNOSIS — Z833 Family history of diabetes mellitus: Secondary | ICD-10-CM

## 2022-07-13 DIAGNOSIS — E861 Hypovolemia: Secondary | ICD-10-CM | POA: Diagnosis present

## 2022-07-13 DIAGNOSIS — E876 Hypokalemia: Secondary | ICD-10-CM | POA: Diagnosis not present

## 2022-07-13 DIAGNOSIS — Z8616 Personal history of COVID-19: Secondary | ICD-10-CM

## 2022-07-13 DIAGNOSIS — E785 Hyperlipidemia, unspecified: Secondary | ICD-10-CM | POA: Diagnosis present

## 2022-07-13 DIAGNOSIS — I132 Hypertensive heart and chronic kidney disease with heart failure and with stage 5 chronic kidney disease, or end stage renal disease: Secondary | ICD-10-CM | POA: Diagnosis present

## 2022-07-13 DIAGNOSIS — I350 Nonrheumatic aortic (valve) stenosis: Secondary | ICD-10-CM

## 2022-07-13 DIAGNOSIS — Z20822 Contact with and (suspected) exposure to covid-19: Secondary | ICD-10-CM | POA: Diagnosis present

## 2022-07-13 DIAGNOSIS — N189 Chronic kidney disease, unspecified: Secondary | ICD-10-CM | POA: Diagnosis present

## 2022-07-13 DIAGNOSIS — D6959 Other secondary thrombocytopenia: Secondary | ICD-10-CM | POA: Diagnosis present

## 2022-07-13 DIAGNOSIS — I5032 Chronic diastolic (congestive) heart failure: Secondary | ICD-10-CM | POA: Diagnosis present

## 2022-07-13 DIAGNOSIS — I878 Other specified disorders of veins: Secondary | ICD-10-CM | POA: Diagnosis present

## 2022-07-13 DIAGNOSIS — M7989 Other specified soft tissue disorders: Secondary | ICD-10-CM | POA: Diagnosis not present

## 2022-07-13 DIAGNOSIS — R6521 Severe sepsis with septic shock: Secondary | ICD-10-CM | POA: Diagnosis present

## 2022-07-13 DIAGNOSIS — T85691A Other mechanical complication of intraperitoneal dialysis catheter, initial encounter: Secondary | ICD-10-CM | POA: Diagnosis not present

## 2022-07-13 DIAGNOSIS — D72829 Elevated white blood cell count, unspecified: Secondary | ICD-10-CM | POA: Diagnosis present

## 2022-07-13 DIAGNOSIS — D638 Anemia in other chronic diseases classified elsewhere: Secondary | ICD-10-CM | POA: Diagnosis not present

## 2022-07-13 DIAGNOSIS — I462 Cardiac arrest due to underlying cardiac condition: Secondary | ICD-10-CM | POA: Diagnosis not present

## 2022-07-13 DIAGNOSIS — M109 Gout, unspecified: Secondary | ICD-10-CM | POA: Diagnosis present

## 2022-07-13 DIAGNOSIS — I340 Nonrheumatic mitral (valve) insufficiency: Secondary | ICD-10-CM | POA: Diagnosis not present

## 2022-07-13 DIAGNOSIS — B957 Other staphylococcus as the cause of diseases classified elsewhere: Secondary | ICD-10-CM | POA: Diagnosis not present

## 2022-07-13 DIAGNOSIS — Z8249 Family history of ischemic heart disease and other diseases of the circulatory system: Secondary | ICD-10-CM

## 2022-07-13 DIAGNOSIS — E1165 Type 2 diabetes mellitus with hyperglycemia: Secondary | ICD-10-CM | POA: Diagnosis present

## 2022-07-13 DIAGNOSIS — R34 Anuria and oliguria: Secondary | ICD-10-CM | POA: Diagnosis present

## 2022-07-13 DIAGNOSIS — N2581 Secondary hyperparathyroidism of renal origin: Secondary | ICD-10-CM | POA: Diagnosis present

## 2022-07-13 DIAGNOSIS — E7800 Pure hypercholesterolemia, unspecified: Secondary | ICD-10-CM | POA: Diagnosis present

## 2022-07-13 DIAGNOSIS — N186 End stage renal disease: Secondary | ICD-10-CM | POA: Diagnosis present

## 2022-07-13 DIAGNOSIS — K567 Ileus, unspecified: Secondary | ICD-10-CM | POA: Diagnosis not present

## 2022-07-13 DIAGNOSIS — R531 Weakness: Secondary | ICD-10-CM | POA: Diagnosis present

## 2022-07-13 DIAGNOSIS — K659 Peritonitis, unspecified: Secondary | ICD-10-CM | POA: Diagnosis not present

## 2022-07-13 DIAGNOSIS — B3731 Acute candidiasis of vulva and vagina: Secondary | ICD-10-CM | POA: Diagnosis present

## 2022-07-13 DIAGNOSIS — E8729 Other acidosis: Secondary | ICD-10-CM | POA: Insufficient documentation

## 2022-07-13 DIAGNOSIS — M8580 Other specified disorders of bone density and structure, unspecified site: Secondary | ICD-10-CM | POA: Diagnosis present

## 2022-07-13 DIAGNOSIS — I7411 Embolism and thrombosis of thoracic aorta: Secondary | ICD-10-CM | POA: Diagnosis present

## 2022-07-13 DIAGNOSIS — Z885 Allergy status to narcotic agent status: Secondary | ICD-10-CM

## 2022-07-13 DIAGNOSIS — I083 Combined rheumatic disorders of mitral, aortic and tricuspid valves: Secondary | ICD-10-CM | POA: Diagnosis not present

## 2022-07-13 DIAGNOSIS — E875 Hyperkalemia: Secondary | ICD-10-CM | POA: Diagnosis present

## 2022-07-13 DIAGNOSIS — N1339 Other hydronephrosis: Secondary | ICD-10-CM | POA: Diagnosis present

## 2022-07-13 DIAGNOSIS — G629 Polyneuropathy, unspecified: Secondary | ICD-10-CM | POA: Diagnosis not present

## 2022-07-13 DIAGNOSIS — I9589 Other hypotension: Secondary | ICD-10-CM | POA: Diagnosis present

## 2022-07-13 DIAGNOSIS — I959 Hypotension, unspecified: Secondary | ICD-10-CM | POA: Diagnosis present

## 2022-07-13 LAB — CBC WITH DIFFERENTIAL/PLATELET
Abs Immature Granulocytes: 0 10*3/uL (ref 0.00–0.07)
Basophils Absolute: 0 10*3/uL (ref 0.0–0.1)
Basophils Relative: 0 %
Eosinophils Absolute: 0.3 10*3/uL (ref 0.0–0.5)
Eosinophils Relative: 1 %
HCT: 45.9 % (ref 36.0–46.0)
Hemoglobin: 15.7 g/dL — ABNORMAL HIGH (ref 12.0–15.0)
Lymphocytes Relative: 17 %
Lymphs Abs: 4.7 10*3/uL — ABNORMAL HIGH (ref 0.7–4.0)
MCH: 31.8 pg (ref 26.0–34.0)
MCHC: 34.2 g/dL (ref 30.0–36.0)
MCV: 93.1 fL (ref 80.0–100.0)
Monocytes Absolute: 1.4 10*3/uL — ABNORMAL HIGH (ref 0.1–1.0)
Monocytes Relative: 5 %
Neutro Abs: 21.2 10*3/uL — ABNORMAL HIGH (ref 1.7–7.7)
Neutrophils Relative %: 77 %
Platelets: 253 10*3/uL (ref 150–400)
RBC: 4.93 MIL/uL (ref 3.87–5.11)
RDW: 13.7 % (ref 11.5–15.5)
WBC: 27.5 10*3/uL — ABNORMAL HIGH (ref 4.0–10.5)
nRBC: 0 % (ref 0.0–0.2)
nRBC: 0 /100 WBC

## 2022-07-13 LAB — SARS CORONAVIRUS 2 BY RT PCR: SARS Coronavirus 2 by RT PCR: NEGATIVE

## 2022-07-13 LAB — LACTIC ACID, PLASMA: Lactic Acid, Venous: 2.7 mmol/L (ref 0.5–1.9)

## 2022-07-13 MED ORDER — SODIUM CHLORIDE 0.9 % IV SOLN
1.0000 g | Freq: Once | INTRAVENOUS | Status: AC
  Administered 2022-07-13: 1 g via INTRAVENOUS
  Filled 2022-07-13: qty 10

## 2022-07-13 MED ORDER — ACETAMINOPHEN 500 MG PO TABS
1000.0000 mg | ORAL_TABLET | Freq: Once | ORAL | Status: AC
  Administered 2022-07-13: 1000 mg via ORAL
  Filled 2022-07-13: qty 2

## 2022-07-13 MED ORDER — MIDODRINE HCL 5 MG PO TABS
10.0000 mg | ORAL_TABLET | Freq: Three times a day (TID) | ORAL | Status: DC
  Administered 2022-07-14 – 2022-07-30 (×52): 10 mg via ORAL
  Filled 2022-07-13 (×52): qty 2

## 2022-07-13 MED ORDER — IOHEXOL 300 MG/ML  SOLN
100.0000 mL | Freq: Once | INTRAMUSCULAR | Status: AC | PRN
Start: 2022-07-13 — End: 2022-07-13
  Administered 2022-07-13: 100 mL via INTRAVENOUS

## 2022-07-13 MED ORDER — VANCOMYCIN VARIABLE DOSE PER UNSTABLE RENAL FUNCTION (PHARMACIST DOSING)
Status: DC
Start: 2022-07-13 — End: 2022-07-16

## 2022-07-13 MED ORDER — LACTATED RINGERS IV BOLUS
500.0000 mL | Freq: Once | INTRAVENOUS | Status: AC
  Administered 2022-07-13: 500 mL via INTRAVENOUS

## 2022-07-13 MED ORDER — NOREPINEPHRINE 4 MG/250ML-% IV SOLN
2.0000 ug/min | INTRAVENOUS | Status: DC
  Administered 2022-07-13: 2 ug/min via INTRAVENOUS
  Administered 2022-07-14: 4 ug/min via INTRAVENOUS
  Administered 2022-07-15: 3 ug/min via INTRAVENOUS
  Filled 2022-07-13 (×3): qty 250

## 2022-07-13 MED ORDER — HEPARIN SODIUM (PORCINE) 5000 UNIT/ML IJ SOLN
5000.0000 [IU] | Freq: Three times a day (TID) | INTRAMUSCULAR | Status: DC
  Administered 2022-07-14 – 2022-07-15 (×6): 5000 [IU] via SUBCUTANEOUS
  Filled 2022-07-13 (×6): qty 1

## 2022-07-13 MED ORDER — LEVOTHYROXINE SODIUM 75 MCG PO TABS
75.0000 ug | ORAL_TABLET | Freq: Every day | ORAL | Status: DC
  Administered 2022-07-14 – 2022-07-30 (×17): 75 ug via ORAL
  Filled 2022-07-13 (×3): qty 1
  Filled 2022-07-13: qty 3
  Filled 2022-07-13 (×3): qty 1
  Filled 2022-07-13: qty 3
  Filled 2022-07-13 (×8): qty 1
  Filled 2022-07-13: qty 3

## 2022-07-13 MED ORDER — SIMVASTATIN 20 MG PO TABS
40.0000 mg | ORAL_TABLET | Freq: Every day | ORAL | Status: DC
Start: 2022-07-13 — End: 2022-07-31
  Administered 2022-07-14 – 2022-07-29 (×17): 40 mg via ORAL
  Filled 2022-07-13 (×18): qty 2

## 2022-07-13 MED ORDER — POLYETHYLENE GLYCOL 3350 17 G PO PACK
17.0000 g | PACK | Freq: Every day | ORAL | Status: DC | PRN
Start: 2022-07-13 — End: 2022-07-17
  Administered 2022-07-15: 17 g via ORAL
  Filled 2022-07-13: qty 1

## 2022-07-13 MED ORDER — ESCITALOPRAM OXALATE 10 MG PO TABS
5.0000 mg | ORAL_TABLET | Freq: Every day | ORAL | Status: DC
  Administered 2022-07-14 – 2022-07-27 (×14): 5 mg via ORAL
  Filled 2022-07-13 (×14): qty 1

## 2022-07-13 MED ORDER — LACTATED RINGERS IV BOLUS
1000.0000 mL | Freq: Once | INTRAVENOUS | Status: AC
  Administered 2022-07-13: 1000 mL via INTRAVENOUS

## 2022-07-13 MED ORDER — ACETAMINOPHEN 325 MG PO TABS
650.0000 mg | ORAL_TABLET | ORAL | Status: DC | PRN
  Administered 2022-07-14 – 2022-07-29 (×18): 650 mg via ORAL
  Filled 2022-07-13 (×19): qty 2

## 2022-07-13 MED ORDER — PANTOPRAZOLE SODIUM 40 MG PO TBEC
40.0000 mg | DELAYED_RELEASE_TABLET | Freq: Every day | ORAL | Status: DC
  Administered 2022-07-14 – 2022-07-30 (×17): 40 mg via ORAL
  Filled 2022-07-13 (×17): qty 1

## 2022-07-13 MED ORDER — SODIUM CHLORIDE 0.9 % IV SOLN: INTRAVENOUS | Status: DC | PRN

## 2022-07-13 MED ORDER — CHLORHEXIDINE GLUCONATE CLOTH 2 % EX PADS
6.0000 | MEDICATED_PAD | Freq: Every day | CUTANEOUS | Status: DC
  Administered 2022-07-13: 6 via TOPICAL

## 2022-07-13 MED ORDER — EZETIMIBE 10 MG PO TABS
10.0000 mg | ORAL_TABLET | Freq: Every day | ORAL | Status: DC
  Administered 2022-07-14 – 2022-07-30 (×17): 10 mg via ORAL
  Filled 2022-07-13 (×18): qty 1

## 2022-07-13 MED ORDER — DOCUSATE SODIUM 100 MG PO CAPS
100.0000 mg | ORAL_CAPSULE | Freq: Two times a day (BID) | ORAL | Status: DC | PRN
  Administered 2022-07-15 (×2): 100 mg via ORAL
  Filled 2022-07-13 (×2): qty 1

## 2022-07-13 MED ORDER — ALBUTEROL SULFATE (2.5 MG/3ML) 0.083% IN NEBU: 3.0000 mL | INHALATION_SOLUTION | RESPIRATORY_TRACT | Status: DC | PRN

## 2022-07-13 MED ORDER — VANCOMYCIN HCL 1500 MG/300ML IV SOLN
1500.0000 mg | Freq: Once | INTRAVENOUS | Status: AC
  Administered 2022-07-13: 1500 mg via INTRAVENOUS
  Filled 2022-07-13: qty 300

## 2022-07-13 MED ORDER — SODIUM CHLORIDE 0.9 % IV SOLN
250.0000 mL | INTRAVENOUS | Status: DC
  Administered 2022-07-14: 250 mL via INTRAVENOUS

## 2022-07-13 NOTE — ED Provider Notes (Signed)
[facility] EMERGENCY DEPARTMENT Provider Note   CSN: [id number] Arrival date & time: 07/09/2022  1523     History  Chief Complaint  Patient presents with   Weakness    Erika Fry is a 76 y.o. female PMH ESRD (on peritoneal dialysis), diabetes mellitus, anemia of chronic disease, thrombocytopenia, HFpEF, pacemaker who is presenting with fatigue and generalized weakness.  Patient reports that symptoms began 2 to 3 days ago.  Since then, she has still been doing peritoneal dialysis.  However, has not been eating or drinking much.  She takes a few sips of fluid, but says that it makes her stomach hurt.  She notes generalized abdominal tenderness.  She denies any fevers, chest pain.  Notes some shortness of breath, which she reports is not significantly different than normal.  Denies any nausea, vomiting, hematemesis, diarrhea, melena, hematochezia.  Notes peripheral edema that is improved from baseline.  Home Medications Prior to Admission medications   Medication Sig Start Date End Date Taking? Authorizing Provider  acetaminophen (TYLENOL) 325 MG tablet Take 650 mg by mouth every 6 (six) hours as needed for moderate pain or headache.    [provider]  albuterol (VENTOLIN HFA) 108 (90 Base) MCG/ACT inhaler INHALE 2 PUFFS BY MOUTH EVERY 4 TO 6 HOURS AS NEEDED FOR WHEEZING, SEE ADMIN INSTRUCTIONS Patient taking differently: Inhale 2 puffs into the lungs every 4 (four) hours as needed for shortness of breath or wheezing. 02/12/21   Adrian Prows, MD  B Complex-C-Zn-Folic Acid (DIALYVITE 256 WITH ZINC) 0.8 MG TABS Take 1 tablet by mouth daily.  07/01/19   [provider]  Biotin 10000 MCG TABS Take 10,000 mcg by mouth daily with breakfast.    [provider]  cetirizine (ZYRTEC) 10 MG tablet Take 10 mg by mouth at bedtime.     [provider]  cinacalcet (SENSIPAR) 60 MG tablet Take 60 mg by mouth 2 (two) times a week. Mon friday     [provider]  Cyanocobalamin (VITAMIN B12) 1000 MCG TBCR Take 1,000 mcg by mouth daily.    [provider]  escitalopram (LEXAPRO) 5 MG tablet Take 5 mg by mouth daily. 07/25/21   [provider]  ezetimibe (ZETIA) 10 MG tablet Take 1 tablet (10 mg total) by mouth daily. 05/09/22 05/04/23  Cantwell, Celeste C, PA-C  feeding supplement (ENSURE ENLIVE / ENSURE PLUS) LIQD Take 237 mLs by mouth daily. 08/17/21   Ghimire, Henreitta Leber, MD  ferric citrate (AURYXIA) 1 GM 210 MG(Fe) tablet Take 420 mg by mouth 3 (three) times daily with meals.     [provider]  fluconazole (DIFLUCAN) 150 MG tablet Take 1 tablet (150 mg total) by mouth daily for 1 dose. Take 5 days from now. 04/29/22   Blue, Soijett A, PA-C  fluticasone (FLONASE) 50 MCG/ACT nasal spray Place 2 sprays into both nostrils daily as needed for allergies or rhinitis.    [provider]  gentamicin cream (GARAMYCIN) 0.1 % Apply 1 application topically daily. Apply to exit site. 08/17/21   Ghimire, Henreitta Leber, MD  HYDROcodone-acetaminophen (NORCO/VICODIN) 5-325 MG tablet Take 1 tablet by mouth every 6 (six) hours as needed for up to 20 doses for severe pain. 04/22/22   Wyvonnia Dusky, MD  levothyroxine (SYNTHROID) 75 MCG tablet Take 1 tablet (75 mcg total) by mouth daily before breakfast. 04/30/19   Love, Ivan Anchors, PA-C  midodrine (PROAMATINE) 10 MG tablet Take 1 tablet (10 mg total)  by mouth 3 (three) times daily. 05/02/22   Cantwell, Celeste C, PA-C  omeprazole (PRILOSEC) 20 MG capsule TAKE 1 CAPSULE BY MOUTH ONCE DAILY BEFORE BREAKFAST Patient taking differently: Take 20 mg by mouth daily. 05/17/21   Cantwell, Celeste C, PA-C  pseudoephedrine (SUDAFED) 30 MG tablet Take 1 tablet (30 mg total) by mouth 2 (two) times daily. 08/17/21   Ghimire, Henreitta Leber, MD  senna (SENOKOT) 8.6 MG TABS tablet Take 1 tablet by mouth at bedtime.    [provider]  simvastatin (ZOCOR) 40 MG tablet Take 1 tablet (40 mg  total) by mouth at bedtime. 04/21/19   Dessa Phi, DO      Allergies    Baclofen, Tape, Other, Codeine, and Dextromethorphan-guaifenesin    Review of Systems   Review of Systems As in HPI.  Physical Exam Updated Vital Signs BP (!) 89/57   Pulse (!) 113   Temp 99.3 F (37.4 C) (Oral)   Resp 18   SpO2 (!) 71%  Physical Exam Vitals and nursing note reviewed.  Constitutional:      General: She is not in acute distress.    Appearance: Normal appearance. She is well-developed. She is not ill-appearing or diaphoretic.     Comments: Tired appearing elderly female resting comfortably in bed.  No acute distress.  HENT:     Head: Normocephalic and atraumatic.     Right Ear: External ear normal.     Left Ear: External ear normal.     Nose: Nose normal.     Mouth/Throat:     Mouth: Mucous membranes are moist.  Cardiovascular:     Rate and Rhythm: Normal rate and regular rhythm.     Heart sounds: Murmur heard.  Pulmonary:     Effort: Pulmonary effort is normal. No respiratory distress.     Breath sounds: Normal breath sounds. No wheezing.  Abdominal:     General: There is no distension.     Palpations: Abdomen is soft.     Tenderness: There is abdominal tenderness. There is no guarding.     Comments: Peritoneal dialysis catheter in place. Dressing clean, dry, intact. Generalized tenderness to palpation, worse in left lower quadrant.  Musculoskeletal:        General: Tenderness present.     Cervical back: Neck supple.     Right lower leg: Edema present.     Left lower leg: Edema present.     Comments: Significant tenderness to palpation over bilateral lower extremities.  Skin:    General: Skin is warm and dry.  Neurological:     Mental Status: She is alert.     ED Results / Procedures / Treatments   Labs (all labs ordered are listed, but only abnormal results are displayed) Labs Reviewed  CBC WITH DIFFERENTIAL/PLATELET - Abnormal; Notable for the following components:       Result Value   WBC 27.5 (*)    Hemoglobin 15.7 (*)    All other components within normal limits  COMPREHENSIVE METABOLIC PANEL    EKG EKG Interpretation  Date/Time:  Saturday July 13 2022 17:54:04 EDT Ventricular Rate:  74 PR Interval:  75 QRS Duration: 162 QT Interval:  445 QTC Calculation: 494 R Axis:   8 Text Interpretation: ATRIAL SENSING Electronic ventricular pacemaker Confirmed by Lajean Saver [phone]) on 07/29/2022 10:13:33 PM  Radiology DG Chest Portable 1 View  Result Date: 07/04/2022 CLINICAL DATA:  Fever and weakness EXAM: PORTABLE CHEST 1 VIEW COMPARISON:  Radiographs 08/06/2021 FINDINGS:  No focal consolidation, pleural effusion, or pneumothorax. Cardiomegaly. Prominent fat pad at the cardiac apex. Aortic calcification. Left chest wall pacemaker. No acute osseous abnormality. No significant change from radiographs 08/06/2021. IMPRESSION: No acute findings in the chest.  Cardiomegaly. Electronically Signed   By: Placido Sou M.D.   On: 07/25/2022 18:47   CT ABDOMEN PELVIS W CONTRAST  Result Date: 07/26/2022 CLINICAL DATA:  Left lower quadrant abdominal pain, on peritoneal dialysis EXAM: CT ABDOMEN AND PELVIS WITH CONTRAST TECHNIQUE: Multidetector CT imaging of the abdomen and pelvis was performed using the standard protocol following bolus administration of intravenous contrast. RADIATION DOSE REDUCTION: This exam was performed according to the departmental dose-optimization program which includes automated exposure control, adjustment of the mA and/or kV according to patient size and/or use of iterative reconstruction technique. CONTRAST:  148m OMNIPAQUE IOHEXOL 300 MG/ML  SOLN COMPARISON:  CT 04/22/2022 FINDINGS: Lower chest: Bibasilar atelectasis. Mitral annular coronary artery atherosclerotic calcification. Hepatobiliary: No focal liver abnormality is seen. Status post cholecystectomy. No biliary dilatation. Pancreas: Unremarkable. No pancreatic ductal dilatation  or surrounding inflammatory changes. Spleen: Normal in size without focal abnormality. Adrenals/Urinary Tract: Unremarkable adrenal glands. Bilateral renal atrophy. Multiple bilateral renal calculi measuring 7 mm in the lower pole of the right kidney. 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 Unchanged small renal cysts, too small to definitively characterize and not requiring follow-up. Decreased right hydronephrosis. No ureteral calculi. Bladder is decompressed Stomach/Bowel: Stomach is within normal limits. The appendix is not visualized. No evidence of bowel wall thickening, distention, or inflammatory changes. Vascular/Lymphatic: Aortic atherosclerosis. No enlarged abdominal or pelvic lymph nodes. Reproductive: Unremarkable. Other: Small volume perihepatic and pelvic ascites. The peritoneal dialysis catheter again terminates in the left ventral pelvis. The visualized portions of the catheter appear intact. Free intraperitoneal air is likely related to peritoneal dialysis. Musculoskeletal: Advanced degenerative disc disease at L4-L5. No acute osseous abnormality. IMPRESSION: 1. No acute abnormality in the abdomen or pelvis. 2. Atrophic bilateral kidneys with nonobstructing nephrolithiasis. Chronic right hydronephrosis has decreased since prior. 3. Unchanged positioning of the peritoneal dialysis catheter with tip in the left lower quadrant. Electronically Signed   By: TPlacido SouM.D.   On: 07/08/2022 17:53    Procedures Procedures    Medications Ordered in ED Medications  lactated ringers bolus 500 mL (500 mLs Intravenous New Bag/Given 07/15/2022 1557)    ED Course/ Medical Decision Making/ A&P                           Medical Decision Making Amount and/or Complexity of Data Reviewed Labs: ordered. Radiology: ordered. ECG/medicine tests: ordered.  Risk OTC drugs. Prescription drug management. Decision regarding hospitalization.   JPHOENIX RIESENis a 76y.o. female PMH ESRD  (on peritoneal dialysis), diabetes mellitus, anemia of chronic disease, thrombocytopenia, HFpEF, pacemaker who is presenting with fatigue and generalized weakness.    Vitals at presentation notable for hypotension with pressure 82/46.  Patient afebrile, not tachycardic, tachypneic nor hypoxic on room air.  Physical exam notable for diffuse abdominal tenderness and tenderness to palpation over bilateral lower extremities with some pitting edema present.  Initial differential includes but is not limited to: Electrolyte derangement, infection, sepsis, need for dialysis, dehydration, CHF exacerbation, anemia, arrhythmia  Lab work obtained, resulted notable for CMP with electrolyte derangements.  Sodium 131, chloride 88, bicarb 20.  BUN and creatinine elevated, consistent with patient's history of ESRD on peritoneal dialysis.  No elevated LFTs.  Anion  gap of 23.  Unclear cause of anion gap, may be related to lactic acidosis, his lactic elevated at 2.7.  May also be due to dehydration or starvation ketoacidosis in the setting of poor p.o. intake.  CBC with leukocytosis 27.5 with leftward shift.  Hemoglobin elevated at 15.7.  Imaging was pursued, notable for chest x-ray without evidence of focal consolidation, pneumothorax, pleural effusion per radiology read.  CT abdomen pelvis obtained given patient had abdominal tenderness on exam.  However, resulted negative for acute abnormality per radiology read.  EKG obtained, demonstrates ventricular paced rhythm, ventricular rate 74 bpm. Interpreted by myself and my attending.  Patient with multiple lab abnormalities and some blood pressure, concerning for sepsis.  Patient was given 1 L LR bolus for gentle IV hydration, as she does have a history of CHF and is anuric, and I did not want to cause volume overload or pulmonary edema.  Patient also started on broad-spectrum antibiotics including vancomycin and cefepime with concern for potential infection in the setting  of marked leukocytosis, lactic acid, and anion gap.  Following administration of fluids, patient remained hypotensive with systolics in the 70 to 74Q.  Per chart review, patient has struggled with orthostatic hypotension, being prescribed midodrine at one point, and has documented pressures from 59-563 systolics.  Patient initially triaged to hospitalist, however they were concerned regarding patient's low pressures and limited ability for IVF use.  I contacted critical care, who came and evaluated the patient and have agreed to accept her for admission to their service.  The plan for this patient was discussed with Dr. Ashok Cordia, who voiced agreement and who oversaw evaluation and treatment of this patient.   Final Clinical Impression(s) / ED Diagnoses Final diagnoses:  Weakness    Rx / DC Orders ED Discharge Orders     None         Faylene Million, MD 07/06/2022 2344    Lajean Saver, MD 07/14/22 [phone]

## 2022-07-13 NOTE — ED Notes (Signed)
Respiratory at bedside placing art line. Pt has finished meal and remains Aox4.

## 2022-07-13 NOTE — Progress Notes (Addendum)
RT attempted A-line x2 with no success. Provider notified.

## 2022-07-13 NOTE — ED Notes (Signed)
Family at bedside. Pt feeling 'much better' after fluid bolus and tylenol.

## 2022-07-13 NOTE — ED Notes (Signed)
McQuaid MD verbal order to bolus 1L and okay for patient to eat and drink. Pressors to follow bolus.

## 2022-07-13 NOTE — ED Triage Notes (Signed)
BIB [facility] from home. Feeling weak past several weeks. Worsening more so over past several days. Peritoneal dialysis daily- right side abdominal port. Aox4. Complains of vaginal pain- for years.  18RR 70HR- pacemaker 97% RA 136 CBG 106/63

## 2022-07-13 NOTE — ED Notes (Signed)
CC MD at bedside

## 2022-07-13 NOTE — ED Notes (Signed)
BP remains low. MD notified. Fluids continue. Pt remains Aox4 throughout entire visit. No complaints of dizziness or faint feelings. Pt is able to roll and pick self up for bedpan use without complaint. Difficulty taking blood pressures due to restricted right upper extremity and tender legs. Manual BP obtained to verify.

## 2022-07-13 NOTE — Progress Notes (Signed)
eLink Physician-Brief Progress Note Patient Name: Erika Fry DOB: [birth year] MRN: [id number]   Date of Service  07/12/2022  HPI/Events of Note  24 female admitted to ICU. Dr Lake Bells notes, labs, meds reviewed  Camera: On room air, MAP 65. HR 79.  Levophed gtt  76 yr old female Septic shock- CxR neg. PD cath related peritonitis. CT abdomen neg.  AVB s/p pacemaker Hypothyroidism.   eICU Interventions  - continue current plan of care - follow LA, keep MAP > 65. Got fluids, on Vanc/cefepime. - VTE: sq heparin.      Intervention Category Major Interventions: Sepsis - evaluation and management;Hypotension - evaluation and management Evaluation Type: New Patient Evaluation  Erika Fry 07/20/2022, 11:53 PM  01:33 LA 2.9, delayed clearance. Follow LA in AM K 5.3. follow BMP in AM.  Sodium decreasing. Will need dialysis- sooner or later by Nephrology today  02:11  Nephrology seeing him for dialysis tonight per RN.  Mag 1 gm ordered.

## 2022-07-13 NOTE — Progress Notes (Signed)
[facility]  CC: fatigue and weakness HPI: 76 y/o female on PD presented with the above chief complaint after having mild abdominal pain, poor po intake for 2-3 days.  Noted to be hypotensive in the ER, given fluids (1 liter saline) but still has soft BP.  PCCM consulted for consideration of admission.  She reports never having a problem with PD.  I cared for her nearly a year ago when she had acute cholecystitis causing septic shock. She was d/c'd on midodrine which she was still taking in late 2022 but apparently d/c'd at some point this year.  She reports no chest pain, cough, dyspnea.  No nausea/vomiting/diarrhea.    Past Medical History:  Diagnosis Date   Arthritis    "knees, thumbs" (03/25/2018)   AV block, [place] 2 03/25/2018   CKD (chronic kidney disease), stage IV (HCC)    Complication of anesthesia    Reports had hard time waking up in the past   Diet-controlled diabetes mellitus ([place])    Encounter for care of pacemaker 09/07/2019   End stage renal failure on dialysis ([place])    T, Th, Sat [place]   GERD (gastroesophageal reflux disease)    Gout    "on daily RX" (03/25/2018)   Heart murmur    High cholesterol    History of COVID-19 12/2019   Hypothyroidism    Iron deficiency anemia    "had to get an iron infusion"   Migraine    "used to have them growing up" (03/25/2018)   Pneumonia 12/2019   with COVID   Presence of permanent cardiac pacemaker 03/25/2018   medtronic dual pacemaker: Dr Crissie Sickles   Vitals:   07/23/2022 2030 07/07/2022 2109 07/25/2022 2130 07/14/2022 2205  BP: (!) 86/46  (!) 80/44 (!) 83/44  Pulse: 70  72 73  Resp: '16  17 14  '$ Temp:  97.7 F (36.5 C)    TempSrc:  Oral    SpO2: 100%  100% 98%  Weight:      Height:       General:  Chronically ill appearing, resting comfortably in bed HENT: NCAT OP clear PULM: CTA B, normal effort CV: RRR, no mgr GI: BS+, soft, focal tenderness below PD site, no guarding or rebound tenderness, no drainage from PD catheter  site MSK: normal bulk and tone Derm: chronic venous stasis changes in legs Neuro: awake, alert, no distress, MAEW  CBC    Component Value Date/Time   WBC 27.5 (H) 07/17/2022 1546   RBC 4.93 07/17/2022 1546   HGB 15.7 (H) 07/06/2022 1546   HCT 45.9 07/11/2022 1546   HCT 20.1 (L) 05/13/2018 0427   PLT 253 07/24/2022 1546   MCV 93.1 07/18/2022 1546   MCH 31.8 07/19/2022 1546   MCHC 34.2 07/06/2022 1546   RDW 13.7 07/18/2022 1546   LYMPHSABS 4.7 (H) 07/11/2022 1546   MONOABS 1.4 (H) 08/01/2022 1546   EOSABS 0.3 07/29/2022 1546   BASOSABS 0.0 07/18/2022 1546   BMET    Component Value Date/Time   NA 131 (L) 07/02/2022 1546   NA 134 07/02/2018 1409   K 4.2 07/20/2022 1546   CL 88 (L) 07/17/2022 1546   CO2 20 (L) 07/20/2022 1546   GLUCOSE 153 (H) 07/25/2022 1546   BUN 50 (H) 07/24/2022 1546   BUN 48 (H) 07/02/2018 1409   CREATININE 8.22 (H) 07/29/2022 1546   CREATININE 3.24 (H) 12/21/2018 1029   CALCIUM 11.0 (H) 07/24/2022 1546   CALCIUM 9.6 10/31/2017  [place] 5 (L) 07/19/2022 1546   SARS COV2 negative Lactic acid is 2.7  CT ab/pelvis > no acute abnormality in abdomen or pelvis, atrophic kidneys, chronic hydronephrosis on R, PD catheter unchanged  Impression: Septic shock Likely peritonitis related to PD catheter Chronic hypotension ESRD on PD History of high grade AV block s/p pacemaker Acute oligoarthritis of ankles> possible gout GERD Hyperlipidemia Hypothyroidism  Discussion: With mild abdominal pain and tenderness I'm concerned about peritonitis.  Not sure what to make of ankle tenderness> monitor carefully and if worse consider ortho consult for joint aspiration.  She is still volume deplete.  Her chronic hypotension complicates this.  Plan: Admit to ICU Give another liter of crystalloid now If no improvement in BP then start levophed Vanc/cefepime Ask nephrology to help [place] sample fluid from her peritoneum for cell count and culture; for PD  management as well Resume midodrine '10mg'$  po tid Tele Serial exam with ankle tenderness, low threshold to have ortho perform arthrocentesis if not improving with mobility OK to eat  Rest per PA note  My cc time 30 minutes  Roselie Awkward, MD [facility] Pager: [phone] Cell: [phone] After 7:00 pm call Elink  [phone]

## 2022-07-13 NOTE — Progress Notes (Signed)
Pharmacy Antibiotic Note  Erika Fry is a 76 y.o. female for which pharmacy has been consulted for vancomycin dosing for bacteremia.  Patient with a history of ESRD (PD), DM, anemia of chronic disease, thrombocytopenia, HFpEF, pacemaker . Patient presenting with fatigue and generalized weakness x 2-3 days. Reports not missing PD.  WBC 27.5; LA 2.7; T 99.3 F  Plan: Vancomycin 1500 mg once - subsequent dosing to be determined by dialysis plan and levels Cefepime per MD (one time dose of 1g ordered by EDP) Trend WBC, Fever, Renal function, & Clinical course F/u cultures, clinical course, WBC, fever De-escalate when able F/u Nephrology plan  Height: '5\' 5"'$  (165.1 cm) Weight: 68 kg (150 lb) IBW/kg (Calculated) : 57  Temp (24hrs), Avg:99.3 F (37.4 C), Min:99.3 F (37.4 C), Max:99.3 F (37.4 C)  Recent Labs  Lab 07/08/2022 1546 07/15/2022 1809  WBC 27.5*  --   CREATININE 8.22*  --   LATICACIDVEN  --  2.7*    Estimated Creatinine Clearance: 5.1 mL/min (A) (by C-G formula based on SCr of 8.22 mg/dL (H)).    Allergies  Allergen Reactions   Baclofen Other (See Comments)    Somnolence- PUT THE PATIENT INTO A COMA AND "ALMOST KILLED HER"    Tape Other (See Comments)    SKIN WILL TEAR AND BRUISE EASILY!!   Other Other (See Comments)    Patient has a high tolerance to antibiotics- has taken a lot of them during the course of her life   Codeine Other (See Comments)    Increased pain and couldn't sleep   Dextromethorphan-Guaifenesin Other (See Comments)    Unknown reaction    Antimicrobials this admission: cefepime 8/12 >>  vancomycin 8/12 >>   Microbiology results: Pending  Thank you for allowing pharmacy to be a part of this patient's care.  Lorelei Pont, PharmD, BCPS 07/06/2022 8:26 PM ED Clinical Pharmacist -  [phone]

## 2022-07-13 NOTE — H&P (Signed)
NAME:  Erika Fry, MRN:  [id number], DOB:  06-26-1946, LOS: 0 ADMISSION DATE:  07/15/2022, CONSULTATION DATE:  8/12 REFERRING MD:  Dr. Ashok Cordia, CHIEF COMPLAINT:  Hypotension   History of Present Illness:  Patient is a 76 year old female with pertinent PMH of ESRD on peritoneal dialysis, DMT2, anemia, thrombocytopenia, HFpEF, pacemaker in place presents to [facility] ED on 8/12 with generalized weakness.  Patient states she has been noticing fatigue and generalized weakness for the past 2 to 3 days.  Has had decreased appetite and not drinking as much due to generalized abdominal tenderness.  She has been doing her peritoneal dialysis.  Denies fever, chest pain, N/V/D.  States she has stopped taking home midodrine for about a week. Came to [facility] ED on 8/12 for further work-up.  Upon arrival to [facility] ED, BP 82/54.  Other vitals stable and on room air.  Afebrile.  Patient given IV fluids.  Patient states she felt better after fluids but BP remained low.  CXR with no significant findings.  CT abdomen/pelvis with no acute abnormality.  WBC 27.5, Hgb 15.7, NA 131, creat 8.22, BUN 50, alk phos 178, AG 23, CO2 20.  COVID/flu negative.  LA 2.7.  BC x2 obtained and patient started on cefepime/Vanco.  PCCM consulted for ICU admission given hypotension.  Pertinent  Medical History   Past Medical History:  Diagnosis Date   Arthritis    "knees, thumbs" (03/25/2018)   AV block, Mobitz 2 03/25/2018   CKD (chronic kidney disease), stage IV (HCC)    Complication of anesthesia    Reports had hard time waking up in the past   Diet-controlled diabetes mellitus ([place])    Encounter for care of pacemaker 09/07/2019   End stage renal failure on dialysis ([place])    T, Th, Sat [place]   GERD (gastroesophageal reflux disease)    Gout    "on daily RX" (03/25/2018)   Heart murmur    High cholesterol    History of COVID-19 12/2019   Hypothyroidism    Iron deficiency anemia    "had to get an iron infusion"   Migraine     "used to have them growing up" (03/25/2018)   Pneumonia 12/2019   with COVID   Presence of permanent cardiac pacemaker 03/25/2018   medtronic dual pacemaker: Dr Crissie Sickles     Significant Hospital Events: Including procedures, antibiotic start and stop dates in addition to other pertinent events   8/12: admitted to [facility] w/ hypotension  Interim History / Subjective:  MAP 55 Patient mentating well On room air  Objective   Blood pressure (!) 86/46, pulse 70, temperature 97.7 F (36.5 C), temperature source Oral, resp. rate 16, height $RemoveBe'5\' 5"'PopDrIaBj$  (1.651 m), weight 68 kg, SpO2 100 %.        Intake/Output Summary (Last 24 hours) at 07/12/2022 2140 Last data filed at 07/15/2022 1708 Gross per 24 hour  Intake 502.11 ml  Output --  Net 502.11 ml   Filed Weights   07/26/2022 1737  Weight: 68 kg    Examination: General:   NAD HEENT: MM pink/dry Neuro: Aox3; MAE CV: s1s2, paced 70s, no m/r/g PULM:  dim clear BS bilaterally; on room air GI: soft, bsx4 active; peritoneal dialysis port in place without erythema/drainage; some diffuse abd pain on palpation Extremities: warm/dry, trace ble edema; pain to palpation on ankles; mild erythema and some warmth w/ palpation   Skin: no rashes or lesions appreciated   Resolved Hospital Problem list  Assessment & Plan:  Shock: likely sepsis but unknown source of infection; has not been taking home midodrine Abdominal tenderness: CT abd/pelvis no acute abnormality; some chronic hydronephrosis.  P: -admit to icu with continuous telemetry -will give 1 L IV fluids -if MAP remains <65 will start levo -consult nephro for sample of peritoneal fluid to rule out peritonitis -Continue Vanc/cefepime -Follow-up cultures -trend LA -Check PCT -Trend WBC/fever curve -resume home midodrine -consider arterial line placement  -Consider echo  ESRD on peritoneal dialysis: does not make urine AGMA P: -nephro consulted for dialysis needs -Trend BMP  / urinary output -Replace electrolytes as indicated -Avoid nephrotoxic agents, ensure adequate renal perfusion  HFpEF: Echo 9/thousand 22 showing LVEF greater than 75%; moderate elevated PA systolic pressure; LA and RA severely dilated; moderate tricuspid valve regurgitation AV block w/ Pacemaker in place HLD P: -daily weights; strict I/o's -dialysis per nephro -telemetry monitoring -cont statin and zetia  Hyponatremia P: -likely hypovolemia related -iv fluids -trend bmp  B/L chronic venous stasis -pain on palpation with exam; questionable cellulitis vs. gout P: -dvt US ordered -continue abx  DMT2?: on no home meds; diet controlled P: -Check A1c  Anemia of chronic disease Thrombocytopenia P: -Trend CBC  Hypothyroidism P: -resume home synthroid  Best Practice (right click and "Reselect all SmartList Selections" daily)   Diet/type: Regular consistency (see orders) DVT prophylaxis: prophylactic heparin  GI prophylaxis: PPI Lines: N/A Foley:  N/A Code Status:  full code Last date of multidisciplinary goals of care discussion [8/12 spoke with patient and updated at bedside]  Labs   CBC: Recent Labs  Lab 07/14/2022 1546  WBC 27.5*  NEUTROABS 21.2*  HGB 15.7*  HCT 45.9  MCV 93.1  PLT 496    Basic Metabolic Panel: Recent Labs  Lab 07/24/2022 1546  NA 131*  K 4.2  CL 88*  CO2 20*  GLUCOSE 153*  BUN 50*  CREATININE 8.22*  CALCIUM 11.0*   GFR: Estimated Creatinine Clearance: 5.1 mL/min (A) (by C-G formula based on SCr of 8.22 mg/dL (H)). Recent Labs  Lab 07/12/2022 1546 08/01/2022 1809  WBC 27.5*  --   LATICACIDVEN  --  2.7*    Liver Function Tests: Recent Labs  Lab 07/28/2022 1546  AST 21  ALT 21  ALKPHOS 178*  BILITOT 0.6  PROT 7.4  ALBUMIN 3.1*   No results for input(s): "LIPASE", "AMYLASE" in the last 168 hours. No results for input(s): "AMMONIA" in the last 168 hours.  ABG    Component Value Date/Time   HCO3 28.3 (H) 03/20/2018 1441    TCO2 27 09/08/2019 0846   O2SAT 70.0 03/20/2018 1441     Coagulation Profile: No results for input(s): "INR", "PROTIME" in the last 168 hours.  Cardiac Enzymes: No results for input(s): "CKTOTAL", "CKMB", "CKMBINDEX", "TROPONINI" in the last 168 hours.  HbA1C: Hgb A1c MFr Bld  Date/Time Value Ref Range Status  08/07/2021 06:26 AM 6.2 (H) 4.8 - 5.6 % Final    Comment:    (NOTE) Pre diabetes:          5.7%-6.4%  Diabetes:              >6.4%  Glycemic control for   <7.0% adults with diabetes   04/30/2018 04:55 AM 6.3 (H) 4.8 - 5.6 % Final    Comment:    (NOTE) Pre diabetes:          5.7%-6.4% Diabetes:              >  6.4% Glycemic control for   <7.0% adults with diabetes     CBG: No results for input(s): "GLUCAP" in the last 168 hours.  Review of Systems:   Review of Systems  Constitutional:  Positive for malaise/fatigue. Negative for chills and fever.  Respiratory:  Negative for cough and wheezing.   Cardiovascular:  Negative for chest pain.  Gastrointestinal:  Positive for abdominal pain. Negative for constipation, diarrhea, nausea and vomiting.  Neurological:  Negative for dizziness.     Past Medical History:  She,  has a past medical history of Arthritis, AV block, Mobitz 2 (03/25/2018), CKD (chronic kidney disease), stage IV ([place]), Complication of anesthesia, Diet-controlled diabetes mellitus ([place]), Encounter for care of pacemaker (09/07/2019), End stage renal failure on dialysis [facility]), GERD (gastroesophageal reflux disease), Gout, Heart murmur, High cholesterol, History of COVID-19 (12/2019), Hypothyroidism, Iron deficiency anemia, Migraine, Pneumonia (12/2019), and Presence of permanent cardiac pacemaker (03/25/2018).   Surgical History:   Past Surgical History:  Procedure Laterality Date   APPENDECTOMY     ARTERY BIOPSY Right 06/25/2021   Procedure: TEMPORAL ARTERY BIOPSY RIGHT;  Surgeon: Armandina Gemma, MD;  Location: [facility];  Service: General;  Laterality:  Right;  LMA   AV FISTULA PLACEMENT Right 06/29/2019   Procedure: ARTERIOVENOUS (AV) FISTULA CREATION RIGHT UPPER  ARM;  Surgeon: Waynetta Sandy, MD;  Location: [place];  Service: Vascular;  Laterality: Right;   [place] Right 09/08/2019   Procedure: BASILIC VEIN TRANSPOSITION RIGHT SECOND STAGE;  Surgeon: Waynetta Sandy, MD;  Location: [place];  Service: Vascular;  Laterality: Right;   BIOPSY  05/19/2018   Procedure: BIOPSY;  Surgeon: Ladene Artist, MD;  Location[facility] END[facility];  Service: Endoscopy;;   CHOLECYSTECTOMY N/A 08/09/2021   Procedure: LAPAROSCOPIC CHOLECYSTECTOMY;  Surgeon: Jesusita Oka, MD;  Location: [place];  Service: General;  Laterality: N/A;   CYSTOSCOPY W/ RETROGRADES Bilateral 01/14/2019   Procedure: CYSTOSCOPY WITH RETROGRADE PYELOGRAM BILATERAL HYDRODISTENTION;  Surgeon: Ardis Hughs, MD;  Location: [facility];  Service: Urology;  Laterality: Bilateral;   ESOPHAGOGASTRODUODENOSCOPY N/A 05/19/2018   Procedure: ESOPHAGOGASTRODUODENOSCOPY (EGD);  Surgeon: Ladene Artist, MD;  Location: [facility] ENDOSCOPY;  Service: Endoscopy;  Laterality: N/A;   ESOPHAGOGASTRODUODENOSCOPY (EGD) WITH PROPOFOL N/A 01/28/2019   Procedure: ESOPHAGOGASTRODUODENOSCOPY (EGD) WITH PROPOFOL;  Surgeon: Rush Landmark Telford Nab., MD;  Location: [place];  Service: Gastroenterology;  Laterality: N/A;   INSERT / REPLACE / REMOVE PACEMAKER  03/25/2018   IR FLUORO GUIDE CV LINE RIGHT  05/18/2018   IR FLUORO GUIDE CV LINE RIGHT  06/24/2019   IR LUMBAR DISC ASPIRATION W/IMG GUIDE  05/15/2018   IR NEPHROSTOMY EXCHANGE RIGHT  05/28/2019   IR NEPHROSTOMY EXCHANGE RIGHT  07/19/2019   IR NEPHROSTOMY PLACEMENT RIGHT  04/09/2019   IR REMOVAL TUN CV CATH W/O FL  07/23/2018   IR US GUIDE VASC ACCESS RIGHT  05/18/2018   IR US GUIDE VASC ACCESS RIGHT  06/24/2019   KNEE ARTHROSCOPY Bilateral    PACEMAKER IMPLANT N/A 03/25/2018   Procedure: PACEMAKER IMPLANT;  Surgeon: Evans Lance, MD;   Location: [place] CV LAB;  Service: Cardiovascular;  Laterality: N/A;   PACEMAKER PLACEMENT Left 03/2018   POLYPECTOMY  01/28/2019   Procedure: POLYPECTOMY;  Surgeon: Mansouraty, Telford Nab., MD;  Location: [place];  Service: Gastroenterology;;   RIGHT HEART CATH N/A 03/20/2018   Procedure: RIGHT HEART CATH;  Surgeon: Nigel Mormon, MD;  Location: [place] CV LAB;  Service: Cardiovascular;  Laterality: N/A;  TEE WITHOUT CARDIOVERSION N/A 03/20/2018   Procedure: TRANSESOPHAGEAL ECHOCARDIOGRAM (TEE);  Surgeon: Nigel Mormon, MD;  Location: [facility] ENDOSCOPY;  Service: Cardiovascular;  Laterality: N/A;   TONSILLECTOMY     URETEROSCOPY WITH HOLMIUM LASER LITHOTRIPSY Right 01/14/2019   Procedure: URETEROSCOPY WITH HOLMIUM LASER LITHOTRIPSY BLADDER BIOPSIES RIGHT STENT PLACEMENT;  Surgeon: Ardis Hughs, MD;  Location: [facility];  Service: Urology;  Laterality: Right;     Social History:   reports that she has never smoked. She has never used smokeless tobacco. She reports that she does not currently use alcohol. She reports that she does not use drugs.   Family History:  Her family history includes Diabetes Mellitus II in her brother and father; Gastric cancer in her brother; Heart attack in her father; Heart disease in her father; Hypertension in her mother; Stomach cancer in her brother.   Allergies Allergies  Allergen Reactions   Baclofen Other (See Comments)    Somnolence- PUT THE PATIENT INTO A COMA AND "ALMOST KILLED HER"    Tape Other (See Comments)    SKIN WILL TEAR AND BRUISE EASILY!!   Other Other (See Comments)    Patient has a high tolerance to antibiotics- has taken a lot of them during the course of her life   Codeine Other (See Comments)    Increased pain and couldn't sleep   Dextromethorphan-Guaifenesin Other (See Comments)    Unknown reaction     Home Medications  Prior to Admission medications   Medication Sig Start Date End Date Taking?  Authorizing Provider  acetaminophen (TYLENOL) 325 MG tablet Take 650 mg by mouth every 6 (six) hours as needed for moderate pain or headache.    [provider]  albuterol (VENTOLIN HFA) 108 (90 Base) MCG/ACT inhaler INHALE 2 PUFFS BY MOUTH EVERY 4 TO 6 HOURS AS NEEDED FOR WHEEZING, SEE ADMIN INSTRUCTIONS Patient taking differently: Inhale 2 puffs into the lungs every 4 (four) hours as needed for shortness of breath or wheezing. 02/12/21   Adrian Prows, MD  B Complex-C-Zn-Folic Acid (DIALYVITE 032 WITH ZINC) 0.8 MG TABS Take 1 tablet by mouth daily.  07/01/19   [provider]  Biotin 10000 MCG TABS Take 10,000 mcg by mouth daily with breakfast.    [provider]  cetirizine (ZYRTEC) 10 MG tablet Take 10 mg by mouth at bedtime.     [provider]  cinacalcet (SENSIPAR) 60 MG tablet Take 60 mg by mouth 2 (two) times a week. Mon friday    [provider]  Cyanocobalamin (VITAMIN B12) 1000 MCG TBCR Take 1,000 mcg by mouth daily.    [provider]  escitalopram (LEXAPRO) 5 MG tablet Take 5 mg by mouth daily. 07/25/21   [provider]  ezetimibe (ZETIA) 10 MG tablet Take 1 tablet (10 mg total) by mouth daily. 05/09/22 05/04/23  Cantwell, Celeste C, PA-C  feeding supplement (ENSURE ENLIVE / ENSURE PLUS) LIQD Take 237 mLs by mouth daily. 08/17/21   Ghimire, Henreitta Leber, MD  ferric citrate (AURYXIA) 1 GM 210 MG(Fe) tablet Take 420 mg by mouth 3 (three) times daily with meals.     [provider]  fluconazole (DIFLUCAN) 150 MG tablet Take 1 tablet (150 mg total) by mouth daily for 1 dose. Take 5 days from now. 04/29/22   Blue, Soijett A, PA-C  fluticasone (FLONASE) 50 MCG/ACT nasal spray Place 2 sprays into both nostrils daily as needed for allergies or rhinitis.    [provider]  gentamicin cream (GARAMYCIN) 0.1 % Apply 1 application topically daily. Apply to exit site. 08/17/21   Ghimire, Henreitta Leber, MD  HYDROcodone-acetaminophen  (NORCO/VICODIN) 5-325 MG tablet Take 1 tablet by mouth every 6 (six) hours as needed for up to 20 doses for severe pain. 04/22/22   Wyvonnia Dusky, MD  levothyroxine (SYNTHROID) 75 MCG tablet Take 1 tablet (75 mcg total) by mouth daily before breakfast. 04/30/19   Love, Ivan Anchors, PA-C  midodrine (PROAMATINE) 10 MG tablet Take 1 tablet (10 mg total) by mouth 3 (three) times daily. 05/02/22   Cantwell, Celeste C, PA-C  omeprazole (PRILOSEC) 20 MG capsule TAKE 1 CAPSULE BY MOUTH ONCE DAILY BEFORE BREAKFAST Patient taking differently: Take 20 mg by mouth daily. 05/17/21   Cantwell, Celeste C, PA-C  pseudoephedrine (SUDAFED) 30 MG tablet Take 1 tablet (30 mg total) by mouth 2 (two) times daily. 08/17/21   Ghimire, Henreitta Leber, MD  senna (SENOKOT) 8.6 MG TABS tablet Take 1 tablet by mouth at bedtime.    [provider]  simvastatin (ZOCOR) 40 MG tablet Take 1 tablet (40 mg total) by mouth at bedtime. 04/21/19   Dessa Phi, DO     Critical care time: 45 minutes    JD Rexene Agent [facility] 07/12/2022, 9:40 PM  Please see [email or web address] for pager details.  From 7A-7P if no response, please call [phone]. After hours, please call ELink [phone].

## 2022-07-14 ENCOUNTER — Other Ambulatory Visit: Payer: Self-pay

## 2022-07-14 ENCOUNTER — Encounter (HOSPITAL_COMMUNITY): Payer: Medicare Other

## 2022-07-14 ENCOUNTER — Encounter (HOSPITAL_COMMUNITY): Payer: Self-pay | Admitting: Pulmonary Disease

## 2022-07-14 DIAGNOSIS — A419 Sepsis, unspecified organism: Secondary | ICD-10-CM | POA: Diagnosis not present

## 2022-07-14 DIAGNOSIS — R6521 Severe sepsis with septic shock: Secondary | ICD-10-CM | POA: Diagnosis not present

## 2022-07-14 LAB — BODY FLUID CELL COUNT WITH DIFFERENTIAL
Eos, Fluid: 0 %
Lymphs, Fluid: 9 %
Monocyte-Macrophage-Serous Fluid: 80 % (ref 50–90)
Neutrophil Count, Fluid: 11 % (ref 0–25)
Total Nucleated Cell Count, Fluid: 5 cu mm (ref 0–1000)

## 2022-07-14 LAB — GLUCOSE, CAPILLARY
Glucose-Capillary: 124 mg/dL — ABNORMAL HIGH (ref 70–99)
Glucose-Capillary: 165 mg/dL — ABNORMAL HIGH (ref 70–99)

## 2022-07-14 LAB — BASIC METABOLIC PANEL
Anion gap: 19 — ABNORMAL HIGH (ref 5–15)
Anion gap: 23 — ABNORMAL HIGH (ref 5–15)
BUN: 53 mg/dL — ABNORMAL HIGH (ref 8–23)
BUN: 57 mg/dL — ABNORMAL HIGH (ref 8–23)
CO2: 19 mmol/L — ABNORMAL LOW (ref 22–32)
CO2: 18 mmol/L — ABNORMAL LOW (ref 22–32)
Calcium: 9.2 mg/dL (ref 8.9–10.3)
Calcium: 9.5 mg/dL (ref 8.9–10.3)
Chloride: 88 mmol/L — ABNORMAL LOW (ref 98–111)
Chloride: 85 mmol/L — ABNORMAL LOW (ref 98–111)
Creatinine, Ser: 7.79 mg/dL — ABNORMAL HIGH (ref 0.44–1.00)
Creatinine, Ser: 8.12 mg/dL — ABNORMAL HIGH (ref 0.44–1.00)
GFR, Estimated: 5 mL/min — ABNORMAL LOW (ref >60)
GFR, Estimated: 5 mL/min — ABNORMAL LOW (ref >60)
Glucose, Bld: 129 mg/dL — ABNORMAL HIGH (ref 70–99)
Glucose, Bld: 193 mg/dL — ABNORMAL HIGH (ref 70–99)
Potassium: 5.3 mmol/L — ABNORMAL HIGH (ref 3.5–5.1)
Potassium: 3.6 mmol/L (ref 3.5–5.1)
Sodium: 126 mmol/L — ABNORMAL LOW (ref 135–145)
Sodium: 126 mmol/L — ABNORMAL LOW (ref 135–145)

## 2022-07-14 LAB — COMPREHENSIVE METABOLIC PANEL
ALT: 21 U/L (ref 0–44)
AST: 21 U/L (ref 15–41)
Albumin: 3.1 g/dL — ABNORMAL LOW (ref 3.5–5.0)
Alkaline Phosphatase: 178 U/L — ABNORMAL HIGH (ref 38–126)
Anion gap: 23 — ABNORMAL HIGH (ref 5–15)
BUN: 50 mg/dL — ABNORMAL HIGH (ref 8–23)
CO2: 20 mmol/L — ABNORMAL LOW (ref 22–32)
Calcium: 11 mg/dL — ABNORMAL HIGH (ref 8.9–10.3)
Chloride: 88 mmol/L — ABNORMAL LOW (ref 98–111)
Creatinine, Ser: 8.22 mg/dL — ABNORMAL HIGH (ref 0.44–1.00)
GFR, Estimated: 5 mL/min — ABNORMAL LOW (ref >60)
Glucose, Bld: 153 mg/dL — ABNORMAL HIGH (ref 70–99)
Potassium: 4.2 mmol/L (ref 3.5–5.1)
Sodium: 131 mmol/L — ABNORMAL LOW (ref 135–145)
Total Bilirubin: 0.6 mg/dL (ref 0.3–1.2)
Total Protein: 7.4 g/dL (ref 6.5–8.1)

## 2022-07-14 LAB — CBC
HCT: 36.3 % (ref 36.0–46.0)
Hemoglobin: 12.3 g/dL (ref 12.0–15.0)
MCH: 31.8 pg (ref 26.0–34.0)
MCHC: 33.9 g/dL (ref 30.0–36.0)
MCV: 93.8 fL (ref 80.0–100.0)
Platelets: 189 10*3/uL (ref 150–400)
RBC: 3.87 MIL/uL (ref 3.87–5.11)
RDW: 13.8 % (ref 11.5–15.5)
WBC: 23.8 10*3/uL — ABNORMAL HIGH (ref 4.0–10.5)
nRBC: 0 % (ref 0.0–0.2)

## 2022-07-14 LAB — LACTIC ACID, PLASMA
Lactic Acid, Venous: 2.9 mmol/L (ref 0.5–1.9)
Lactic Acid, Venous: 1.6 mmol/L (ref 0.5–1.9)

## 2022-07-14 LAB — MAGNESIUM: Magnesium: 1.7 mg/dL (ref 1.7–2.4)

## 2022-07-14 LAB — HEMOGLOBIN A1C
Hgb A1c MFr Bld: 6 % — ABNORMAL HIGH (ref 4.8–5.6)
Mean Plasma Glucose: 125.5 mg/dL

## 2022-07-14 LAB — GRAM STAIN: Gram Stain: NONE SEEN

## 2022-07-14 LAB — MRSA NEXT GEN BY PCR, NASAL: MRSA by PCR Next Gen: NOT DETECTED

## 2022-07-14 LAB — LIPASE, BLOOD: Lipase: 39 U/L (ref 11–51)

## 2022-07-14 LAB — PHOSPHORUS: Phosphorus: 8.2 mg/dL — ABNORMAL HIGH (ref 2.5–4.6)

## 2022-07-14 LAB — PROCALCITONIN: Procalcitonin: 1.87 ng/mL

## 2022-07-14 MED ORDER — ORAL CARE MOUTH RINSE: 15.0000 mL | OROMUCOSAL | Status: DC | PRN

## 2022-07-14 MED ORDER — MAGNESIUM SULFATE IN D5W 1-5 GM/100ML-% IV SOLN
1.0000 g | Freq: Once | INTRAVENOUS | Status: AC
  Administered 2022-07-14: 1 g via INTRAVENOUS
  Filled 2022-07-14: qty 100

## 2022-07-14 MED ORDER — LACTATED RINGERS IV BOLUS
500.0000 mL | Freq: Once | INTRAVENOUS | Status: AC
  Administered 2022-07-14: 500 mL via INTRAVENOUS

## 2022-07-14 MED ORDER — DELFLEX-LC/1.5% DEXTROSE 344 MOSM/L IP SOLN: INTRAPERITONEAL | Status: DC

## 2022-07-14 MED ORDER — HYDROCODONE-ACETAMINOPHEN 5-325 MG PO TABS
1.0000 | ORAL_TABLET | Freq: Four times a day (QID) | ORAL | Status: DC | PRN
  Administered 2022-07-14 – 2022-07-25 (×26): 1 via ORAL
  Filled 2022-07-14 (×28): qty 1

## 2022-07-14 MED ORDER — SODIUM CHLORIDE 0.9 % IV BOLUS
750.0000 mL | Freq: Once | INTRAVENOUS | Status: AC
  Administered 2022-07-14: 750 mL via INTRAVENOUS

## 2022-07-14 MED ORDER — LACTATED RINGERS IV SOLN
INTRAVENOUS | Status: DC
Start: 2022-07-14 — End: 2022-07-14

## 2022-07-14 MED ORDER — GENTAMICIN SULFATE 0.1 % EX CREA
1.0000 | TOPICAL_CREAM | Freq: Every day | CUTANEOUS | Status: DC
  Administered 2022-07-14 – 2022-07-15 (×2): 1 via TOPICAL
  Filled 2022-07-14: qty 15

## 2022-07-14 MED ORDER — SODIUM CHLORIDE 0.9 % IV SOLN
1.0000 g | INTRAVENOUS | Status: AC
  Administered 2022-07-14 – 2022-07-19 (×6): 1 g via INTRAVENOUS
  Filled 2022-07-14 (×7): qty 10

## 2022-07-14 MED ORDER — CHLORHEXIDINE GLUCONATE CLOTH 2 % EX PADS
6.0000 | MEDICATED_PAD | CUTANEOUS | Status: DC
  Administered 2022-07-15 – 2022-07-21 (×7): 6 via TOPICAL

## 2022-07-14 MED ORDER — SODIUM CHLORIDE 0.9 % IV SOLN
INTRAVENOUS | Status: DC
Start: 2022-07-14 — End: 2022-07-15

## 2022-07-14 NOTE — Consult Note (Addendum)
Renal Service Consult Note [facility]  Erika Fry 07/14/2022 Sol Blazing, MD Requesting Physician: Dr. Loanne Drilling  Reason for Consult: ESRD pt w/ hypotension HPI: The patient is a 76 y.o. year-old w/ hx of DJD, ESRD on PD, gout, HL, hypothyroid, migraine, sp PPM feeling weak, severely fatigued getting worse the past several days. In ED denied CP, fevers, n/v/d. Stopped taking her midodrine for last week. BP 82.54, CT abd negative, WBC 27K, Hb 15.7, creat 8.2  CO2 20. LA 2.7. Pt started on cefepime/ vanc and admitted to ICU. Pressors started. We are asked to see for ESRD.    Pt states a few weeks ago she put herself on strict low salt, low fluid diet due to her swollen legs and ankles. She says it has helped her legs a lot, but she then got very weak and fatigued as above. +mild gen'd abd pain, no constipation. Saw ED MD recently who told her she had vaginal yeast infection.   Pt lives alone usually, now her son's family is staying w/ here while they are awaiting to move into their new home. On HD x 2 yrs then PD x 2 yrs. No leg edema (see above) or SOB.   ROS - denies CP, no joint pain, no HA, no blurry vision, no rash, no diarrhea, no nausea/ vomiting   Past Medical History  Past Medical History:  Diagnosis Date   Arthritis    "knees, thumbs" (03/25/2018)   AV block, Mobitz 2 03/25/2018   CKD (chronic kidney disease), stage IV (HCC)    Complication of anesthesia    Reports had hard time waking up in the past   Diet-controlled diabetes mellitus ([place])    Encounter for care of pacemaker 09/07/2019   End stage renal failure on dialysis ([place])    T, Th, Sat [place]   GERD (gastroesophageal reflux disease)    Gout    "on daily RX" (03/25/2018)   Heart murmur    High cholesterol    History of COVID-19 12/2019   Hypothyroidism    Iron deficiency anemia    "had to get an iron infusion"   Migraine    "used to have them growing up" (03/25/2018)   Pneumonia  12/2019   with COVID   Presence of permanent cardiac pacemaker 03/25/2018   medtronic dual pacemaker: Dr Crissie Sickles   Past Surgical History  Past Surgical History:  Procedure Laterality Date   APPENDECTOMY     ARTERY BIOPSY Right 06/25/2021   Procedure: TEMPORAL ARTERY BIOPSY RIGHT;  Surgeon: Armandina Gemma, MD;  [facility]: [facility];  Service: General;  Laterality: Right;  LMA   AV FISTULA PLACEMENT Right 06/29/2019   Procedure: ARTERIOVENOUS (AV) FISTULA CREATION RIGHT UPPER  ARM;  Surgeon: Waynetta Sandy, MD;  Location: [place];  Service: Vascular;  Laterality: Right;   [place] Right 09/08/2019   Procedure: BASILIC VEIN TRANSPOSITION RIGHT SECOND STAGE;  Surgeon: Waynetta Sandy, MD;  Location: [place];  Service: Vascular;  Laterality: Right;   BIOPSY  05/19/2018   Procedure: BIOPSY;  Surgeon: Ladene Artist, MD;  Location: [place];  Service: Endoscopy;;   CHOLECYSTECTOMY N/A 08/09/2021   Procedure: LAPAROSCOPIC CHOLECYSTECTOMY;  Surgeon: Jesusita Oka, MD;  Location: [place];  Service: General;  Laterality: N/A;   CYSTOSCOPY W/ RETROGRADES Bilateral 01/14/2019   Procedure: CYSTOSCOPY WITH RETROGRADE PYELOGRAM BILATERAL HYDRODISTENTION;  Surgeon: Ardis Hughs, MD;  [facility];  Service: Urology;  Laterality:  Bilateral;   ESOPHAGOGASTRODUODENOSCOPY N/A 05/19/2018   Procedure: ESOPHAGOGASTRODUODENOSCOPY (EGD);  Surgeon: Ladene Artist, MD;  Location: [facility]OSCOPY;  Service: Endoscopy;  Laterality: N/A;   ESOPHAGOGASTRODUODENOSCOPY (EGD) WITH PROPOFOL N/A 01/28/2019   Procedure: ESOPHAGOGASTRODUODENOSCOPY (EGD) WITH PROPOFOL;  Surgeon: Rush Landmark Telford Nab., MD;  Location: [place];  Service: Gastroenterology;  Laterality: N/A;   INSERT / REPLACE / REMOVE PACEMAKER  03/25/2018   IR FLUORO GUIDE CV LINE RIGHT  05/18/2018   IR FLUORO GUIDE CV LINE RIGHT  06/24/2019   IR LUMBAR DISC ASPIRATION W/IMG GUIDE  05/15/2018   IR NEPHROSTOMY EXCHANGE  RIGHT  05/28/2019   IR NEPHROSTOMY EXCHANGE RIGHT  07/19/2019   IR NEPHROSTOMY PLACEMENT RIGHT  04/09/2019   IR REMOVAL TUN CV CATH W/O FL  07/23/2018   IR US GUIDE VASC ACCESS RIGHT  05/18/2018   IR US GUIDE VASC ACCESS RIGHT  06/24/2019   KNEE ARTHROSCOPY Bilateral    PACEMAKER IMPLANT N/A 03/25/2018   Procedure: PACEMAKER IMPLANT;  Surgeon: Evans Lance, MD;  Location: [place] CV LAB;  Service: Cardiovascular;  Laterality: N/A;   PACEMAKER PLACEMENT Left 03/2018   POLYPECTOMY  01/28/2019   Procedure: POLYPECTOMY;  Surgeon: Mansouraty, Telford Nab., MD;  Location: [place];  Service: Gastroenterology;;   RIGHT HEART CATH N/A 03/20/2018   Procedure: RIGHT HEART CATH;  Surgeon: Nigel Mormon, MD;  Location: [place] CV LAB;  Service: Cardiovascular;  Laterality: N/A;   TEE WITHOUT CARDIOVERSION N/A 03/20/2018   Procedure: TRANSESOPHAGEAL ECHOCARDIOGRAM (TEE);  Surgeon: Nigel Mormon, MD;  Location: [facility] ENDOSCOPY;  Service: Cardiovascular;  Laterality: N/A;   TONSILLECTOMY     URETEROSCOPY WITH HOLMIUM LASER LITHOTRIPSY Right 01/14/2019   Procedure: URETEROSCOPY WITH HOLMIUM LASER LITHOTRIPSY BLADDER BIOPSIES RIGHT STENT PLACEMENT;  Surgeon: Ardis Hughs, MD;  [facility];  Service: Urology;  Laterality: Right;   Family History  Family History  Problem Relation Age of Onset   Hypertension Mother    Diabetes Mellitus II Father    Heart disease Father    Heart attack Father    Gastric cancer Brother    Diabetes Mellitus II Brother    Stomach cancer Brother    Social History  reports that she has never smoked. She has never used smokeless tobacco. She reports that she does not currently use alcohol. She reports that she does not use drugs. Allergies  Allergies  Allergen Reactions   Lioresal [Baclofen] Other (See Comments)    Somnolence "Put me into a coma and almost killed me"    Tape Other (See Comments)    Very sensitive skin, will tear easliy   Other  Other (See Comments)    Patient has a high tolerance to antibiotics- has taken a lot of them during the course of her life   Codeine Other (See Comments)    Increased pain and couldn't sleep   Robitussin Cold Cough+ Chest [Dextromethorphan-Guaifenesin] Other (See Comments)    Unknown reaction   Home medications Prior to Admission medications   Medication Sig Start Date End Date Taking? Authorizing Provider  acetaminophen (TYLENOL) 325 MG tablet Take 650 mg by mouth every 6 (six) hours as needed for moderate pain or headache.    [provider]  albuterol (VENTOLIN HFA) 108 (90 Base) MCG/ACT inhaler INHALE 2 PUFFS BY MOUTH EVERY 4 TO 6 HOURS AS NEEDED FOR WHEEZING, SEE ADMIN INSTRUCTIONS Patient taking differently: Inhale 2 puffs into the lungs every 4 (four) hours as needed for shortness  of breath or wheezing. 02/12/21   Adrian Prows, MD  B Complex-C-Zn-Folic Acid (DIALYVITE 751 WITH ZINC) 0.8 MG TABS Take 1 tablet by mouth daily.  07/01/19   [provider]  Biotin 10000 MCG TABS Take 10,000 mcg by mouth daily with breakfast.    [provider]  calcitRIOL (ROCALTROL) 0.5 MCG capsule Take 0.5 mcg by mouth daily. 05/23/22   [provider]  cetirizine (ZYRTEC) 10 MG tablet Take 10 mg by mouth at bedtime.     [provider]  Cyanocobalamin (VITAMIN B12) 1000 MCG TBCR Take 1,000 mcg by mouth daily.    [provider]  diazepam (VALIUM) 10 MG tablet 10 mg See admin instructions. 10 mg vaginally every night as needed 06/29/22   [provider]  escitalopram (LEXAPRO) 5 MG tablet Take 5 mg by mouth daily. 07/25/21   [provider]  ezetimibe (ZETIA) 10 MG tablet Take 1 tablet (10 mg total) by mouth daily. 05/09/22 05/04/23  Cantwell, Celeste C, PA-C  feeding supplement (ENSURE ENLIVE / ENSURE PLUS) LIQD Take 237 mLs by mouth daily. 08/17/21   Ghimire, Henreitta Leber, MD  ferric citrate (AURYXIA) 1 GM 210 MG(Fe) tablet Take 420 mg by mouth 3  (three) times daily with meals.     [provider]  fluconazole (DIFLUCAN) 150 MG tablet Take 1 tablet (150 mg total) by mouth daily for 1 dose. Take 5 days from now. Patient not taking: Reported on 07/14/2022 04/29/22   Blue, Soijett A, PA-C  fluticasone (FLONASE) 50 MCG/ACT nasal spray Place 2 sprays into both nostrils daily as needed for allergies or rhinitis.    [provider]  gentamicin cream (GARAMYCIN) 0.1 % Apply 1 application topically daily. Apply to exit site. Patient not taking: Reported on 07/14/2022 08/17/21   Jonetta Osgood, MD  HYDROcodone-acetaminophen (NORCO/VICODIN) 5-325 MG tablet Take 1 tablet by mouth every 6 (six) hours as needed for up to 20 doses for severe pain. Patient taking differently: Take 1 tablet by mouth every 4 (four) hours as needed (pain). 04/22/22   Wyvonnia Dusky, MD  levothyroxine (SYNTHROID) 75 MCG tablet Take 1 tablet (75 mcg total) by mouth daily before breakfast. 04/30/19   Love, Ivan Anchors, PA-C  meloxicam (MOBIC) 15 MG tablet Take 15 mg by mouth daily. 06/28/22   [provider]  midodrine (PROAMATINE) 10 MG tablet Take 1 tablet (10 mg total) by mouth 3 (three) times daily. 05/02/22   Cantwell, Celeste C, PA-C  naloxone (NARCAN) nasal spray 4 mg/0.1 mL Place 1 spray into the nose as needed. 07/20/2022   [provider]  omeprazole (PRILOSEC) 20 MG capsule TAKE 1 CAPSULE BY MOUTH ONCE DAILY BEFORE BREAKFAST Patient taking differently: Take 20 mg by mouth daily. 05/17/21   Cantwell, Celeste C, PA-C  senna (SENOKOT) 8.6 MG TABS tablet Take 1 tablet by mouth at bedtime.    [provider]  simvastatin (ZOCOR) 40 MG tablet Take 1 tablet (40 mg total) by mouth at bedtime. 04/21/19   Dessa Phi, DO     Vitals:   07/14/22 1000 07/14/22 1100 07/14/22 1113 07/14/22 1200  BP: 97/60 96/61 (!) 99/53 (!) 84/48  Pulse: 71 65 64 65  Resp: '20 19 16 13  '$ Temp:   98.4 F (36.9 C)   TempSrc:   Oral   SpO2: 100% 100% 100%  100%  Weight:      Height:       Exam Gen alert, no distress, elderly  WF , not in distress No rash, cyanosis or gangrene Sclera anicteric, throat clear  No jvd or bruits Chest clear bilat to bases, no rales/ wheezing RRR no RG Abd soft ntnd no mass or ascites +bs GU defer MS no joint effusions or deformity Ext no LE or UE edema, no wounds or ulcers Neuro is alert, Ox 3 , nf    PD cath mid abdomen, stable exit site      Home meds include - albuterol, escitalopram, ezetimibe, hydrocodone-acetaminophen, levothyroxine, midodrine 10 tid, simvastatin, prns/ vits/ supps   OP PD: 7d/ wk, 75kg, 6 exchanges overnight, 2800cc , 1.5h dwell, no daybag - last Hb 12.9 on 8/2, last mircera 6/26 - last pth 1027, phos 9 - no esa or vdra   Assessment/ Plan: Hypotension - acute on chronic, suspect vol depletion +/- infection. Cont po midodrine as at home. PD fluid cell ct was very low indicating no infection. Getting empiric IV abx, per CCM.   Hypovolemia - pt has been self-treating her leg edema w/ strict low Na/ low fluid intake the past few weeks and her edema resolved. She looked dehydrated by labs on admit (^^[id number]) and is feeling better after 2.5- 3 L boluses given yesterday. Still looks dry on exam, will rebolus 750 cc and cont IVF's at 100 cc/hr for now.  Hyperkalemia - better, will use NS instead of LR as LR has K+ in it.  ESRD - on CPPD, missed last night, usually good compliance. Uses all 1.5% fluids as OP. Plan PD tonight, all 1.5% fluids.  SP PPM Hyponatremia - giving isotonic fluids , as above Chronic bilat LE venous stasis   Anemia esrd - no esa in OP setting, Hb high here, no esa needs.  MBD ckd - CCa in range, phos is high which is typical for her. Not sure binders.       Rob Camri Molloy  MD 07/14/2022, 1:28 PM Recent Labs  Lab 07/10/2022 1546 07/14/22 0014 07/14/22 1003  HGB 15.7* 12.3  --   ALBUMIN 3.1*  --   --   CALCIUM 11.0* 9.2 9.5  PHOS  --   --  8.2*  CREATININE  8.22* 7.79* 8.12*  K 4.2 5.3* 3.6

## 2022-07-14 NOTE — Progress Notes (Signed)
Notified Elink that dialysis was only at bedside to collect body fluid for cultures that are ordered. This RN has only seen that dialysis nurse. No new orders at this time.

## 2022-07-14 NOTE — Progress Notes (Signed)
Pharmacy Antibiotic Note  Erika Fry is a 76 y.o. female admitted on 07/09/2022 with sepsis.  Pharmacy has been consulted for vancomycin and cefepime dosing.  ESRD on PD at baseline. Plan to continue daily PD while inpatient, first session tonight. Peritoneal fluid without organisms - no antibiotics will be added to PD bags at this time.   She received vancomycin '1500mg'$  x1 dose on 8/12 '@2200'$ . A weight-based load would have been ~1900 mg; however, with cultures negative and low suspicion for MRSA, okay with a lower loading dose.   Plan: Continue cefepime 1g IV Q24H Vancomycin level in 3-4 days if continued on PD.  Height: '5\' 5"'$  (165.1 cm) Weight: 72.8 kg (160 lb 7.9 oz) IBW/kg (Calculated) : 57  Temp (24hrs), Avg:98.3 F (36.8 C), Min:97.7 F (36.5 C), Max:99.3 F (37.4 C)  Recent Labs  Lab 07/29/2022 1546 07/20/2022 1809 07/14/22 0014 07/14/22 0602 07/14/22 1003  WBC 27.5*  --  23.8*  --   --   CREATININE 8.22*  --  7.79*  --  8.12*  LATICACIDVEN  --  2.7* 2.9* 1.6  --     Estimated Creatinine Clearance: 5.7 mL/min (A) (by C-G formula based on SCr of 8.12 mg/dL (H)).    Allergies  Allergen Reactions   Lioresal [Baclofen] Other (See Comments)    Somnolence "Put me into a coma and almost killed me"    Tape Other (See Comments)    Very sensitive skin, will tear easliy   Other Other (See Comments)    Patient has a high tolerance to antibiotics- has taken a lot of them during the course of her life   Codeine Other (See Comments)    Increased pain and couldn't sleep   Robitussin Cold Cough+ Chest [Dextromethorphan-Guaifenesin] Other (See Comments)    Unknown reaction    Antimicrobials this admission: 8/12 Vanc >> 8/12 Cefepime>>  Dose adjustments this admission:  Microbiology results:   Erskine Speed, PharmD 07/14/2022 11:28 AM

## 2022-07-14 NOTE — Progress Notes (Addendum)
NAME:  Erika Fry, MRN:  [id number], DOB:  06/16/1946, LOS: 1 ADMISSION DATE:  07/26/2022, CONSULTATION DATE:  8/12 REFERRING MD:  Dr. Ashok Cordia, CHIEF COMPLAINT:  Hypotension   History of Present Illness:  Patient is a 76 year old female with pertinent PMH of ESRD on peritoneal dialysis, DMT2, anemia, thrombocytopenia, HFpEF, pacemaker in place presents to [facility] ED on 8/12 with generalized weakness.  Patient states she has been noticing fatigue and generalized weakness for the past 2 to 3 days.  Has had decreased appetite and not drinking as much due to generalized abdominal tenderness.  She has been doing her peritoneal dialysis.  Denies fever, chest pain, N/V/D.  States she has stopped taking home midodrine for about a week. Came to [facility] ED on 8/12 for further work-up.  Upon arrival to [facility] ED, BP 82/54.  Other vitals stable and on room air.  Afebrile.  Patient given IV fluids.  Patient states she felt better after fluids but BP remained low.  CXR with no significant findings.  CT abdomen/pelvis with no acute abnormality.  WBC 27.5, Hgb 15.7, NA 131, creat 8.22, BUN 50, alk phos 178, AG 23, CO2 20.  COVID/flu negative.  LA 2.7.  BC x2 obtained and patient started on cefepime/Vanco.  PCCM consulted for ICU admission given hypotension.  Pertinent  Medical History   Past Medical History:  Diagnosis Date   Arthritis    "knees, thumbs" (03/25/2018)   AV block, Mobitz 2 03/25/2018   CKD (chronic kidney disease), stage IV (HCC)    Complication of anesthesia    Reports had hard time waking up in the past   Diet-controlled diabetes mellitus ([place])    Encounter for care of pacemaker 09/07/2019   End stage renal failure on dialysis ([place])    T, Th, Sat [place]   GERD (gastroesophageal reflux disease)    Gout    "on daily RX" (03/25/2018)   Heart murmur    High cholesterol    History of COVID-19 12/2019   Hypothyroidism    Iron deficiency anemia    "had to get an iron infusion"   Migraine     "used to have them growing up" (03/25/2018)   Pneumonia 12/2019   with COVID   Presence of permanent cardiac pacemaker 03/25/2018   medtronic dual pacemaker: Dr Crissie Sickles     [facility] Events: Including procedures, antibiotic start and stop dates in addition to other pertinent events   8/12: admitted to [facility] w/ hypotension  Interim History / Subjective:  Remains on levophed 6 Awake and alert. Does not feel well. Denies pain  Objective   Blood pressure (!) 99/53, pulse 63, temperature 98 F (36.7 C), temperature source Axillary, resp. rate 13, height 5' 5"  (1.651 m), weight 72.8 kg, SpO2 100 %.        Intake/Output Summary (Last 24 hours) at 07/14/2022 1113 Last data filed at 07/14/2022 0700 Gross per 24 hour  Intake 4292.21 ml  Output 1500 ml  Net 2792.21 ml    Filed Weights   07/23/2022 1737 07/14/22 0107  Weight: 68 kg 72.8 kg   Physical Exam: General: Elderly-appearing, no acute distress HENT: [place], AT, OP clear, MMM Eyes: EOMI, no scleral icterus Respiratory: Clear to auscultation bilaterally.  No crackles, wheezing or rales Cardiovascular: RRR, -M/R/G, no JVD GI: BS+, soft, nontender, PD cath covered Extremities: Trace pedal edema, venous stasis in LE, bilateral ankle tenderness Neuro: AAO x4, CNII-XII grossly intact GU: Foley in place  Resolved Hospital Problem list     Assessment & Plan:  Hypovolemic Shock +/- sepsis:  cannot rule out sepsis but unknown source of infection; has not been taking home midodrine. Lactic acidosis cleared Abdominal tenderness: CT abd/pelvis no acute abnormality; some chronic hydronephrosis.  Peritoneal WBC 5 P: -Given total 1.5L -Wean levophed for MAP goal >60 -F/u blood and peritoneal cultures -Continue antibiotics: Vanc/cefepime -Trend WBC/fever curve -resume home midodrine -Echo ordered  ESRD on peritoneal dialysis: does not make urine AGMA P: -nephro consulted for dialysis needs -Trend BMP / urinary  output -Replace electrolytes as indicated -Avoid nephrotoxic agents, ensure adequate renal perfusion  HFpEF: Echo 9/thousand 22 showing LVEF greater than 75%; moderate elevated PA systolic pressure; LA and RA severely dilated; moderate tricuspid valve regurgitation AV block w/ Pacemaker in place HLD P: -daily weights; strict I/o's -dialysis per nephro -telemetry monitoring -cont statin and zetia  Hyponatremia P: -likely hypovolemia related -LR 500 bolus now -trend bmp  B/L chronic venous stasis -pain on palpation with exam; questionable cellulitis vs. gout P: -dvt US ordered -continue abx  DMT2?: on no home meds; diet controlled P: -Check A1c  Anemia of chronic disease Thrombocytopenia P: -Trend CBC  Hypothyroidism P: -resume home synthroid  Best Practice (right click and "Reselect all SmartList Selections" daily)   Diet/type: Regular consistency (see orders) DVT prophylaxis: prophylactic heparin  GI prophylaxis: PPI Lines: N/A Foley:  N/A Code Status:  full code Last date of multidisciplinary goals of care discussion [8/13 spoke with patient and updated at bedside]  Critical care time: 46 minutes    The patient is critically ill with septic shock and requires high complexity decision making for assessment and support, frequent evaluation and titration of therapies, application of advanced monitoring technologies and extensive interpretation of multiple databases.  Independent Critical Care Time: 67 Minutes.   Rodman Pickle, M.D. [facility] Pulmonary/Critical Care Medicine 07/14/2022 11:13 AM   Please see Amion for pager number to reach on-call Pulmonary and Critical Care Team.

## 2022-07-15 ENCOUNTER — Encounter (HOSPITAL_COMMUNITY): Payer: Self-pay | Admitting: Pulmonary Disease

## 2022-07-15 ENCOUNTER — Inpatient Hospital Stay (HOSPITAL_COMMUNITY): Payer: Medicare Other

## 2022-07-15 DIAGNOSIS — J9601 Acute respiratory failure with hypoxia: Secondary | ICD-10-CM | POA: Diagnosis not present

## 2022-07-15 DIAGNOSIS — R571 Hypovolemic shock: Secondary | ICD-10-CM

## 2022-07-15 DIAGNOSIS — M7989 Other specified soft tissue disorders: Secondary | ICD-10-CM | POA: Diagnosis not present

## 2022-07-15 DIAGNOSIS — A419 Sepsis, unspecified organism: Secondary | ICD-10-CM | POA: Diagnosis not present

## 2022-07-15 DIAGNOSIS — Z992 Dependence on renal dialysis: Secondary | ICD-10-CM

## 2022-07-15 DIAGNOSIS — N186 End stage renal disease: Secondary | ICD-10-CM | POA: Diagnosis not present

## 2022-07-15 DIAGNOSIS — Z950 Presence of cardiac pacemaker: Secondary | ICD-10-CM

## 2022-07-15 LAB — CBC
HCT: 35.7 % — ABNORMAL LOW (ref 36.0–46.0)
Hemoglobin: 11.7 g/dL — ABNORMAL LOW (ref 12.0–15.0)
MCH: 31 pg (ref 26.0–34.0)
MCHC: 32.8 g/dL (ref 30.0–36.0)
MCV: 94.4 fL (ref 80.0–100.0)
Platelets: 187 10*3/uL (ref 150–400)
RBC: 3.78 MIL/uL — ABNORMAL LOW (ref 3.87–5.11)
RDW: 13.8 % (ref 11.5–15.5)
WBC: 16.1 10*3/uL — ABNORMAL HIGH (ref 4.0–10.5)
nRBC: 0 % (ref 0.0–0.2)

## 2022-07-15 LAB — BASIC METABOLIC PANEL
Anion gap: 20 — ABNORMAL HIGH (ref 5–15)
BUN: 48 mg/dL — ABNORMAL HIGH (ref 8–23)
CO2: 16 mmol/L — ABNORMAL LOW (ref 22–32)
Calcium: 9.3 mg/dL (ref 8.9–10.3)
Chloride: 92 mmol/L — ABNORMAL LOW (ref 98–111)
Creatinine, Ser: 7.18 mg/dL — ABNORMAL HIGH (ref 0.44–1.00)
GFR, Estimated: 5 mL/min — ABNORMAL LOW (ref >60)
Glucose, Bld: 154 mg/dL — ABNORMAL HIGH (ref 70–99)
Potassium: 3.3 mmol/L — ABNORMAL LOW (ref 3.5–5.1)
Sodium: 128 mmol/L — ABNORMAL LOW (ref 135–145)

## 2022-07-15 LAB — ECHOCARDIOGRAM COMPLETE
AR max vel: 0.87 cm2
AV Area VTI: 0.86 cm2
AV Area mean vel: 0.83 cm2
AV Mean grad: 43.5 mmHg
AV Peak grad: 70.6 mmHg
Ao pk vel: 4.2 m/s
Area-P 1/2: 2.23 cm2
Height: 65 in
MV VTI: 1.53 cm2
S' Lateral: 2.8 cm
Weight: 2828.94 oz

## 2022-07-15 LAB — GLUCOSE, CAPILLARY
Glucose-Capillary: 135 mg/dL — ABNORMAL HIGH (ref 70–99)
Glucose-Capillary: 138 mg/dL — ABNORMAL HIGH (ref 70–99)
Glucose-Capillary: 118 mg/dL — ABNORMAL HIGH (ref 70–99)
Glucose-Capillary: 102 mg/dL — ABNORMAL HIGH (ref 70–99)

## 2022-07-15 LAB — OCCULT BLOOD, POC DEVICE: Fecal Occult Bld: POSITIVE — AB

## 2022-07-15 LAB — LACTIC ACID, PLASMA: Lactic Acid, Venous: 1.4 mmol/L (ref 0.5–1.9)

## 2022-07-15 LAB — MAGNESIUM: Magnesium: 1.7 mg/dL (ref 1.7–2.4)

## 2022-07-15 LAB — BRAIN NATRIURETIC PEPTIDE: B Natriuretic Peptide: 967.3 pg/mL — ABNORMAL HIGH (ref 0.0–100.0)

## 2022-07-15 LAB — TSH: TSH: 4.533 u[IU]/mL — ABNORMAL HIGH (ref 0.350–4.500)

## 2022-07-15 MED ORDER — POTASSIUM CHLORIDE CRYS ER 20 MEQ PO TBCR
20.0000 meq | EXTENDED_RELEASE_TABLET | Freq: Once | ORAL | Status: AC
Start: 2022-07-15 — End: 2022-07-15
  Administered 2022-07-15: 20 meq via ORAL
  Filled 2022-07-15: qty 1

## 2022-07-15 MED ORDER — LIP MEDEX EX OINT: TOPICAL_OINTMENT | CUTANEOUS | Status: DC | PRN

## 2022-07-15 MED ORDER — SODIUM BICARBONATE 650 MG PO TABS
650.0000 mg | ORAL_TABLET | Freq: Two times a day (BID) | ORAL | Status: DC
  Administered 2022-07-15 – 2022-07-26 (×23): 650 mg via ORAL
  Filled 2022-07-15 (×23): qty 1

## 2022-07-15 MED ORDER — HEPARIN BOLUS VIA INFUSION
2000.0000 [IU] | Freq: Once | INTRAVENOUS | Status: AC
  Administered 2022-07-15: 2000 [IU] via INTRAVENOUS
  Filled 2022-07-15: qty 2000

## 2022-07-15 MED ORDER — SODIUM BICARBONATE 650 MG PO TABS
650.0000 mg | ORAL_TABLET | Freq: Once | ORAL | Status: DC
Start: 2022-07-15 — End: 2022-07-15

## 2022-07-15 MED ORDER — SENNA 8.6 MG PO TABS
1.0000 | ORAL_TABLET | Freq: Every day | ORAL | Status: DC
  Administered 2022-07-15 – 2022-07-16 (×2): 8.6 mg via ORAL
  Filled 2022-07-15 (×3): qty 1

## 2022-07-15 MED ORDER — INSULIN ASPART 100 UNIT/ML IJ SOLN
0.0000 [IU] | Freq: Three times a day (TID) | INTRAMUSCULAR | Status: DC
  Administered 2022-07-17 – 2022-07-22 (×5): 1 [IU] via SUBCUTANEOUS
  Administered 2022-07-22 – 2022-07-23 (×2): 2 [IU] via SUBCUTANEOUS
  Administered 2022-07-24 – 2022-07-27 (×3): 1 [IU] via SUBCUTANEOUS

## 2022-07-15 MED ORDER — DOCUSATE SODIUM 100 MG PO CAPS
100.0000 mg | ORAL_CAPSULE | Freq: Two times a day (BID) | ORAL | Status: DC
  Administered 2022-07-16: 100 mg via ORAL
  Filled 2022-07-15 (×3): qty 1

## 2022-07-15 MED ORDER — GENTAMICIN SULFATE 0.1 % EX CREA
1.0000 | TOPICAL_CREAM | Freq: Every day | CUTANEOUS | Status: DC
  Administered 2022-07-16 – 2022-07-28 (×12): 1 via TOPICAL
  Filled 2022-07-15: qty 15

## 2022-07-15 MED ORDER — FERRIC CITRATE 1 GM 210 MG(FE) PO TABS
420.0000 mg | ORAL_TABLET | Freq: Three times a day (TID) | ORAL | Status: DC
  Administered 2022-07-15 – 2022-07-30 (×46): 420 mg via ORAL
  Filled 2022-07-15 (×47): qty 2

## 2022-07-15 MED ORDER — DELFLEX-LC/1.5% DEXTROSE 344 MOSM/L IP SOLN
INTRAPERITONEAL | Status: DC
  Administered 2022-07-15 – 2022-07-24 (×5): 6000 mL via INTRAPERITONEAL

## 2022-07-15 MED ORDER — HEPARIN (PORCINE) 25000 UT/250ML-% IV SOLN
950.0000 [IU]/h | INTRAVENOUS | Status: DC
  Administered 2022-07-15: 1100 [IU]/h via INTRAVENOUS
  Filled 2022-07-15 (×2): qty 250

## 2022-07-15 NOTE — Progress Notes (Signed)
Echocardiogram 2D Echocardiogram has been performed.  Oneal Deputy Antinette Keough RDCS 07/15/2022, 12:44 PM  Notified Dr. Debara Pickett at 12:44

## 2022-07-15 NOTE — Progress Notes (Signed)
Echocardiogram findings reviewed. Will plan for TEE tomorrow morning. Keep NPO after midnight. Will consent the patient in am.   Nigel Mormon, MD Pager: [phone] Office: [phone]

## 2022-07-15 NOTE — Progress Notes (Signed)
Bilateral lower extremity venous duplex has been completed. Preliminary results can be found in CV Proc through chart review.   07/15/22 4:36 PM Erika Fry RVT

## 2022-07-15 NOTE — Progress Notes (Signed)
ANTICOAGULATION CONSULT NOTE - Initial Consult  Pharmacy Consult for Heparin Indication:  VTE treatment  Allergies  Allergen Reactions   Lioresal [Baclofen] Other (See Comments)    Somnolence "Put me into a coma and almost killed me"    Tape Other (See Comments)    Very sensitive skin, will tear easliy   Other Other (See Comments)    Patient has a high tolerance to antibiotics- has taken a lot of them during the course of her life   Codeine Other (See Comments)    Increased pain and couldn't sleep   Robitussin Cold Cough+ Chest [Dextromethorphan-Guaifenesin] Other (See Comments)    Unknown reaction    Patient Measurements: Height: '5\' 5"'$  (165.1 cm) Weight: 80.2 kg (176 lb 12.9 oz) IBW/kg (Calculated) : 57 Heparin Dosing Weight: 68 kg  Vital Signs: Temp: 97.6 F (36.4 C) (08/14 1200) Temp Source: Oral (08/14 1200) BP: 84/48 (08/14 1430) Pulse Rate: 60 (08/14 1430)  Labs: Recent Labs    07/22/2022 1546 07/14/22 0014 07/14/22 1003 07/15/22 0705  HGB 15.7* 12.3  --  11.7*  HCT 45.9 36.3  --  35.7*  PLT 253 189  --  187  CREATININE 8.22* 7.79* 8.12* 7.18*    Estimated Creatinine Clearance: 6.8 mL/min (A) (by C-G formula based on SCr of 7.18 mg/dL (H)).   Medical History: Past Medical History:  Diagnosis Date   Arthritis    "knees, thumbs" (03/25/2018)   AV block, Mobitz 2 03/25/2018   CKD (chronic kidney disease), stage IV (HCC)    Complication of anesthesia    Reports had hard time waking up in the past   Diet-controlled diabetes mellitus ([place])    Encounter for care of pacemaker 09/07/2019   End stage renal failure on dialysis ([place])    T, Th, Sat [place]   GERD (gastroesophageal reflux disease)    Gout    "on daily RX" (03/25/2018)   Heart murmur    High cholesterol    History of COVID-19 12/2019   Hypothyroidism    Iron deficiency anemia    "had to get an iron infusion"   Migraine    "used to have them growing up" (03/25/2018)   Pneumonia 12/2019    with COVID   Presence of permanent cardiac pacemaker 03/25/2018   medtronic dual pacemaker: Dr Crissie Sickles   Assessment: 76 yo female admitted with fatigue and generalized weakness for the past 2 to 3 days.  Found to be in shock with abdominal pain.  Her past medical history includes ESRD on peritoneal dialysis, DMT2, anemia, thrombocytopenia, HFpEF, pacemaker.  Echo read today > concern for vegetation on pacer lead and possibly in aorta. Pharmacy consulted to start heparin drip for VTE treatment.  Patient was not on any AC PTA.  Goal of Therapy:  Heparin level 0.3-0.7 units/ml Monitor platelets by anticoagulation protocol: Yes   Plan:  Give 2000 units bolus x 1 (half IV bolus, due to subq heparin given 2 hours ago) Start heparin infusion at 1100 units/hr Check anti-Xa level in 8 hours and daily while on heparin Continue to monitor H&H and platelets  Alanda Slim, PharmD, [facility] Clinical Pharmacist Please see AMION for all Pharmacists' Contact Phone Numbers 07/15/2022, 3:15 PM

## 2022-07-15 NOTE — Progress Notes (Signed)
eLink Physician-Brief Progress Note Patient Name: Erika Fry DOB: [birth year] MRN: [id number]   Date of Service  07/15/2022  HPI/Events of Note  BSRN states pt with histary of DM2, on renal/carb diet, asking did you want CBG's orders with any converage     last CBG was 0330- 165  eICU Interventions  Ordered low dose sliding scale coverage     Intervention Category Intermediate Interventions: Hyperglycemia - evaluation and treatment  Erika Fry 07/15/2022, 12:44 AM

## 2022-07-15 NOTE — Progress Notes (Signed)
[facility] Progress Note    Assessment/ Plan:   ESRD on CCPD -will c/w nightly PD, will continue to utilize all 1.5% bags -caution w/ fluids -backup access: RUE AVF  Hypotension -acute on chronic. Possible sepsis, source unclear-abx per primary -pressor support per primary service -on midodrine as well -PD cell count reassuring-not indicating infection  Hypovolemia -on isotonic fluids  Hyperkalemia -resolved. Now low, can replete PRN  Hyponatremia -improving w/ isotonic fluid  Anemia of CKD -hgb acceptable/at goal. Not on ESAs as outpatient  CKD MBD -will resume [place] -renal diet  B/L chronic venous stasis -per primary   OP PD: Follows at [facility]. 7d/ wk, 75kg, 6 exchanges overnight, 2800cc , 1.5h dwell, no daybag - last Hb 12.9 on 8/2, last mircera 6/26 - last pth 1027, phos 9 - no esa or vdra  Gean Quint, MD [facility]  Subjective:   No acute events overnight. Remains on levo. Patient reports that she tolerated PD overnight.   Objective:   BP (!) 102/55   Pulse 62   Temp (!) 97.3 F (36.3 C) (Axillary)   Resp (!) 25   Ht '5\' 5"'$  (1.651 m)   Wt 72.8 kg   SpO2 99%   BMI 26.71 kg/m   Intake/Output Summary (Last 24 hours) at 07/15/2022 0835 Last data filed at 07/15/2022 0800 Gross per 24 hour  Intake 4254.41 ml  Output --  Net 4254.41 ml   Weight change:   Physical Exam: Gen:nad CVS:RRR Resp:CTA BL Abd:PD site c/d/I, soft, nontender, nondistended FIE:PPIRJ pedal edema, sensitive to touch/tender to touch mostly in b/l ankles Neuro: awake, alert Dialysis access: PD cath, RUE AVF +b/t  Imaging: DG Chest Portable 1 View  Result Date: 07/19/2022 CLINICAL DATA:  Fever and weakness EXAM: PORTABLE CHEST 1 VIEW COMPARISON:  Radiographs 08/06/2021 FINDINGS: No focal consolidation, pleural effusion, or pneumothorax. Cardiomegaly. Prominent fat pad at the cardiac apex. Aortic calcification. Left chest wall pacemaker. No  acute osseous abnormality. No significant change from radiographs 08/06/2021. IMPRESSION: No acute findings in the chest.  Cardiomegaly. Electronically Signed   By: Placido Sou M.D.   On: 07/28/2022 18:47   CT ABDOMEN PELVIS W CONTRAST  Result Date: 07/26/2022 CLINICAL DATA:  Left lower quadrant abdominal pain, on peritoneal dialysis EXAM: CT ABDOMEN AND PELVIS WITH CONTRAST TECHNIQUE: Multidetector CT imaging of the abdomen and pelvis was performed using the standard protocol following bolus administration of intravenous contrast. RADIATION DOSE REDUCTION: This exam was performed according to the departmental dose-optimization program which includes automated exposure control, adjustment of the mA and/or kV according to patient size and/or use of iterative reconstruction technique. CONTRAST:  19m OMNIPAQUE IOHEXOL 300 MG/ML  SOLN COMPARISON:  CT 04/22/2022 FINDINGS: Lower chest: Bibasilar atelectasis. Mitral annular coronary artery atherosclerotic calcification. Hepatobiliary: No focal liver abnormality is seen. Status post cholecystectomy. No biliary dilatation. Pancreas: Unremarkable. No pancreatic ductal dilatation or surrounding inflammatory changes. Spleen: Normal in size without focal abnormality. Adrenals/Urinary Tract: Unremarkable adrenal glands. Bilateral renal atrophy. Multiple bilateral renal calculi measuring 7 mm in the lower pole of the right kidney. 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 Unchanged small renal cysts, too small to definitively characterize and not requiring follow-up. Decreased right hydronephrosis. No ureteral calculi. Bladder is decompressed Stomach/Bowel: Stomach is within normal limits. The appendix is not visualized. No evidence of bowel wall thickening, distention, or inflammatory changes. Vascular/Lymphatic: Aortic atherosclerosis. No enlarged abdominal or pelvic lymph nodes. Reproductive: Unremarkable. Other: Small volume perihepatic and pelvic ascites. The  peritoneal dialysis catheter again terminates in the left ventral pelvis. The visualized portions of the catheter appear intact. Free intraperitoneal air is likely related to peritoneal dialysis. Musculoskeletal: Advanced degenerative disc disease at L4-L5. No acute osseous abnormality. IMPRESSION: 1. No acute abnormality in the abdomen or pelvis. 2. Atrophic bilateral kidneys with nonobstructing nephrolithiasis. Chronic right hydronephrosis has decreased since prior. 3. Unchanged positioning of the peritoneal dialysis catheter with tip in the left lower quadrant. Electronically Signed   By: Placido Sou M.D.   On: 07/07/2022 17:53    Labs: BMET Recent Labs  Lab 07/25/2022 1546 07/14/22 0014 07/14/22 1003 07/15/22 0705  NA 131* 126* 126* 128*  K 4.2 5.3* 3.6 3.3*  CL 88* 88* 85* 92*  CO2 20* 19* 18* 16*  GLUCOSE 153* 129* 193* 154*  BUN 50* 53* 57* 48*  CREATININE 8.22* 7.79* 8.12* 7.18*  CALCIUM 11.0* 9.2 9.5 9.3  PHOS  --   --  8.2*  --    CBC Recent Labs  Lab 07/28/2022 1546 07/14/22 0014 07/15/22 0705  WBC 27.5* 23.8* 16.1*  NEUTROABS 21.2*  --   --   HGB 15.7* 12.3 11.7*  HCT 45.9 36.3 35.7*  MCV 93.1 93.8 94.4  PLT 253 189 187    Medications:     Chlorhexidine Gluconate Cloth  6 each Topical Daily   escitalopram  5 mg Oral Daily   ezetimibe  10 mg Oral Daily   gentamicin cream  1 Application Topical Daily   heparin  5,000 Units Subcutaneous Q8H   insulin aspart  0-6 Units Subcutaneous TID WC   levothyroxine  75 mcg Oral QAC breakfast   midodrine  10 mg Oral TID with meals   pantoprazole  40 mg Oral Daily   simvastatin  40 mg Oral QHS   vancomycin variable dose per unstable renal function (pharmacist dosing)   Does not apply See admin instructions      Gean Quint, MD [facility] 07/15/2022, 8:35 AM

## 2022-07-15 NOTE — Progress Notes (Signed)
Notified of critical echo read> concern for vegetation on pacer lead and possibly in aorta. Will consult [facility] for TEE.  Julian Hy, DO 07/15/22 2:56 PM [facility]

## 2022-07-15 NOTE — Progress Notes (Signed)
PT Cancellation Note  Patient Details Name: Erika Fry MRN: [id number] DOB: November 27, 1946   Cancelled Treatment:    Reason Eval/Treat Not Completed: Patient declined, no reason specified.  Pt stated she just didn't think she could do any mobility today.  She asked to do the evaluation tomorrow. 07/15/2022  Ginger Carne., PT Acute Rehabilitation Services [phone]  (pager) [phone]  (office)   [name] 07/15/2022, 6:28 PM

## 2022-07-15 NOTE — Progress Notes (Addendum)
NAME:  Erika Fry, MRN:  [id number], DOB:  February 24, 1946, LOS: 2 ADMISSION DATE:  07/06/2022, CONSULTATION DATE:  8/12 REFERRING MD:  Dr. Ashok Cordia, CHIEF COMPLAINT:  Hypotension   History of Present Illness:  Patient is a 76 year old female with pertinent PMH of ESRD on peritoneal dialysis, DMT2, anemia, thrombocytopenia, HFpEF, pacemaker in place presents to [facility] ED on 8/12 with generalized weakness.  Patient states she has been noticing fatigue and generalized weakness for the past 2 to 3 days.  Has had decreased appetite and not drinking as much due to generalized abdominal tenderness.  She has been doing her peritoneal dialysis.  Denies fever, chest pain, N/V/D.  States she has stopped taking home midodrine for about a week. Came to [facility] ED on 8/12 for further work-up.  Upon arrival to [facility] ED, BP 82/54.  Other vitals stable and on room air.  Afebrile.  Patient given IV fluids.  Patient states she felt better after fluids but BP remained low.  CXR with no significant findings.  CT abdomen/pelvis with no acute abnormality.  WBC 27.5, Hgb 15.7, NA 131, creat 8.22, BUN 50, alk phos 178, AG 23, CO2 20.  COVID/flu negative.  LA 2.7.  BC x2 obtained and patient started on cefepime/Vanco.  PCCM consulted for ICU admission given hypotension.  Pertinent  Medical History   Past Medical History:  Diagnosis Date   Arthritis    "knees, thumbs" (03/25/2018)   AV block, [place] 2 03/25/2018   CKD (chronic kidney disease), stage IV (HCC)    Complication of anesthesia    Reports had hard time waking up in the past   Diet-controlled diabetes mellitus ([place])    Encounter for care of pacemaker 09/07/2019   End stage renal failure on dialysis ([place])    T, Th, Sat [place]   GERD (gastroesophageal reflux disease)    Gout    "on daily RX" (03/25/2018)   Heart murmur    High cholesterol    History of COVID-19 12/2019   Hypothyroidism    Iron deficiency anemia    "had to get an iron infusion"   Migraine     "used to have them growing up" (03/25/2018)   Pneumonia 12/2019   with COVID   Presence of permanent cardiac pacemaker 03/25/2018   medtronic dual pacemaker: Dr Crissie Sickles     [facility] Events: Including procedures, antibiotic start and stop dates in addition to other pertinent events   8/12: admitted to [facility] w/ hypotension  Interim History / Subjective:   Has not had a BM in 2 days. Still having some abd soreness. Legs are very tender, but this is normal for her. No other complaints at this time.  Objective   Blood pressure 112/62, pulse 60, temperature 98 F (36.7 C), temperature source Oral, resp. rate 15, height _0  (1.651 m), weight 72.8 kg, SpO2 100 %.        Intake/Output Summary (Last 24 hours) at 07/15/2022 0643 Last data filed at 07/15/2022 0600 Gross per 24 hour  Intake 3593.51 ml  Output --  Net 3593.51 ml   Filed Weights   07/04/2022 1737 07/14/22 0107  Weight: 68 kg 72.8 kg   Physical Exam: General: Elderly-appearing, no acute distress HENT: [place], AT, OP clear, MMM Eyes: EOMI, no scleral icterus Respiratory: Clear to auscultation bilaterally.  No crackles, wheezing or rales Cardiovascular: RRR, -M/R/G, no JVD GI: BS+, soft, nontender, PD cath covered Extremities: Trace pedal edema, venous stasis in LE, bilateral  ankle tenderness Neuro: AAO x4, CNII-XII grossly intact GU: Foley in place   Resolved Hospital Problem list     Assessment & Plan:   Shock:  Abdominal tenderness likely sepsis but unknown source of infection; Abd soreness, but ROS negative and workup thus far has been negative. Blood and Peritoneal fluid culture negative, Peritoneal WBC 5, low suspicion for infection. CT and CXR negative. Does not make urine. Has not been taking home midodrine. S/p  total 1.5L.  +7L. Maintaining MAPs with Levo and midodrine. WBC curve improving with abx. -Wean levophed for MAP goal >60 -F/u blood and peritoneal cultures -Continue antibiotics:  Vanc/cefepime -Trend WBC/fever curve  ESRD on peritoneal dialysis AGMA Does not make urine. PD as at home. Nephro consulted -dialysis per nephro -daily weights; strict I/o's -Trend BMP / urinary output -Replace electrolytes as indicated -Avoid nephrotoxic agents, ensure adequate renal perfusion  HFpEF Echo 08/13/21 showing LVEF greater than 75%; moderate elevated PA systolic pressure; LA and RA severely dilated; moderate tricuspid valve regurgitation AV block w/ Pacemaker in place HLD -Echo ordered -telemetry monitoring -cont statin and zetia -continue home midodrine 10 TID  Hyponatremia Likely hypovolemia related -trend bmp  B/L chronic venous stasis Pain on palpation with exam; questionable cellulitis vs. Gout. On Abx -dvt US ordered  DMT2? on no home meds; diet controlled  Anemia of chronic disease Thrombocytopenia - monitor for signs of bleeding - transfuse if hgb <7 -Trend CBC  Hypothyroidism - home synthroid - consider check thyroid  Best Practice (right click and "Reselect all SmartList Selections" daily)   Diet/type: Regular consistency (see orders) DVT prophylaxis: prophylactic heparin  GI prophylaxis: PPI Lines: N/A Foley:  N/A Code Status:  full code Last date of multidisciplinary goals of care discussion [8/13 spoke with patient and updated at bedside]  Critical care time: 46 minutes    The patient is critically ill with septic shock and requires high complexity decision making for assessment and support, frequent evaluation and titration of therapies, application of advanced monitoring technologies and extensive interpretation of multiple databases.  Independent Critical Care Time: 37 Minutes.   Rodman Pickle, M.D. [facility]/Critical Care Medicine 07/15/2022 6:43 AM   Please see Amion for pager number to reach on-call Pulmonary and Critical Care Team.

## 2022-07-16 ENCOUNTER — Inpatient Hospital Stay (HOSPITAL_COMMUNITY): Payer: Medicare Other | Admitting: Certified Registered"

## 2022-07-16 ENCOUNTER — Encounter (HOSPITAL_COMMUNITY): Payer: Self-pay | Admitting: Pulmonary Disease

## 2022-07-16 ENCOUNTER — Inpatient Hospital Stay (HOSPITAL_COMMUNITY): Payer: Medicare Other

## 2022-07-16 ENCOUNTER — Encounter (HOSPITAL_COMMUNITY): Admission: EM | Disposition: E | Payer: Self-pay | Source: Home / Self Care | Attending: Internal Medicine

## 2022-07-16 DIAGNOSIS — M1990 Unspecified osteoarthritis, unspecified site: Secondary | ICD-10-CM | POA: Diagnosis not present

## 2022-07-16 DIAGNOSIS — I509 Heart failure, unspecified: Secondary | ICD-10-CM | POA: Diagnosis not present

## 2022-07-16 DIAGNOSIS — I350 Nonrheumatic aortic (valve) stenosis: Secondary | ICD-10-CM

## 2022-07-16 DIAGNOSIS — A419 Sepsis, unspecified organism: Secondary | ICD-10-CM | POA: Diagnosis not present

## 2022-07-16 DIAGNOSIS — I340 Nonrheumatic mitral (valve) insufficiency: Secondary | ICD-10-CM | POA: Diagnosis not present

## 2022-07-16 DIAGNOSIS — E039 Hypothyroidism, unspecified: Secondary | ICD-10-CM

## 2022-07-16 DIAGNOSIS — N186 End stage renal disease: Secondary | ICD-10-CM | POA: Diagnosis not present

## 2022-07-16 DIAGNOSIS — I083 Combined rheumatic disorders of mitral, aortic and tricuspid valves: Secondary | ICD-10-CM | POA: Diagnosis not present

## 2022-07-16 HISTORY — PX: TEE WITHOUT CARDIOVERSION: SHX5443

## 2022-07-16 LAB — BASIC METABOLIC PANEL
Anion gap: 16 — ABNORMAL HIGH (ref 5–15)
BUN: 46 mg/dL — ABNORMAL HIGH (ref 8–23)
CO2: 19 mmol/L — ABNORMAL LOW (ref 22–32)
Calcium: 9.1 mg/dL (ref 8.9–10.3)
Chloride: 94 mmol/L — ABNORMAL LOW (ref 98–111)
Creatinine, Ser: 6.91 mg/dL — ABNORMAL HIGH (ref 0.44–1.00)
GFR, Estimated: 6 mL/min — ABNORMAL LOW (ref >60)
Glucose, Bld: 115 mg/dL — ABNORMAL HIGH (ref 70–99)
Potassium: 2.9 mmol/L — ABNORMAL LOW (ref 3.5–5.1)
Sodium: 129 mmol/L — ABNORMAL LOW (ref 135–145)

## 2022-07-16 LAB — ECHO TEE
AR max vel: 0.85 cm2
AV Area VTI: 0.83 cm2
AV Area mean vel: 0.87 cm2
AV Mean grad: 41 mmHg
AV Peak grad: 77.1 mmHg
Ao pk vel: 4.39 m/s
MV M vel: 5.04 m/s
MV Peak grad: 101.6 mmHg
P 1/2 time: 527 msec
Radius: 0.4 cm

## 2022-07-16 LAB — CBC
HCT: 33.2 % — ABNORMAL LOW (ref 36.0–46.0)
Hemoglobin: 10.9 g/dL — ABNORMAL LOW (ref 12.0–15.0)
MCH: 31.6 pg (ref 26.0–34.0)
MCHC: 32.8 g/dL (ref 30.0–36.0)
MCV: 96.2 fL (ref 80.0–100.0)
Platelets: 143 K/uL — ABNORMAL LOW (ref 150–400)
RBC: 3.45 MIL/uL — ABNORMAL LOW (ref 3.87–5.11)
RDW: 14.1 % (ref 11.5–15.5)
WBC: 12.9 K/uL — ABNORMAL HIGH (ref 4.0–10.5)
nRBC: 0 % (ref 0.0–0.2)

## 2022-07-16 LAB — GLUCOSE, CAPILLARY
Glucose-Capillary: 109 mg/dL — ABNORMAL HIGH (ref 70–99)
Glucose-Capillary: 97 mg/dL (ref 70–99)
Glucose-Capillary: 90 mg/dL (ref 70–99)
Glucose-Capillary: 111 mg/dL — ABNORMAL HIGH (ref 70–99)

## 2022-07-16 LAB — PATHOLOGIST SMEAR REVIEW

## 2022-07-16 LAB — HEPARIN LEVEL (UNFRACTIONATED): Heparin Unfractionated: 0.88 IU/mL — ABNORMAL HIGH (ref 0.30–0.70)

## 2022-07-16 LAB — MAGNESIUM: Magnesium: 1.6 mg/dL — ABNORMAL LOW (ref 1.7–2.4)

## 2022-07-16 SURGERY — ECHOCARDIOGRAM, TRANSESOPHAGEAL
Anesthesia: Monitor Anesthesia Care

## 2022-07-16 MED ORDER — HEPARIN SODIUM (PORCINE) 5000 UNIT/ML IJ SOLN
5000.0000 [IU] | Freq: Three times a day (TID) | INTRAMUSCULAR | Status: DC
  Administered 2022-07-16 – 2022-07-30 (×43): 5000 [IU] via SUBCUTANEOUS
  Filled 2022-07-16 (×42): qty 1

## 2022-07-16 MED ORDER — MIDAZOLAM HCL 2 MG/2ML IJ SOLN
INTRAMUSCULAR | Status: DC | PRN
  Administered 2022-07-16: 1 mg via INTRAVENOUS

## 2022-07-16 MED ORDER — OXYCODONE HCL 5 MG PO TABS
5.0000 mg | ORAL_TABLET | Freq: Four times a day (QID) | ORAL | Status: DC | PRN
  Administered 2022-07-18 – 2022-07-24 (×13): 5 mg via ORAL
  Filled 2022-07-16 (×13): qty 1

## 2022-07-16 MED ORDER — HEPARIN SODIUM (PORCINE) 5000 UNIT/ML IJ SOLN: 5000.0000 [IU] | Freq: Three times a day (TID) | INTRAMUSCULAR | Status: DC

## 2022-07-16 MED ORDER — PHENYLEPHRINE 80 MCG/ML (10ML) SYRINGE FOR IV PUSH (FOR BLOOD PRESSURE SUPPORT)
PREFILLED_SYRINGE | INTRAVENOUS | Status: DC | PRN
  Administered 2022-07-16: 80 ug via INTRAVENOUS

## 2022-07-16 MED ORDER — DIAZEPAM 2 MG PO TABS: 1.0000 mg | ORAL_TABLET | Freq: Every evening | ORAL | Status: DC | PRN

## 2022-07-16 MED ORDER — BUTAMBEN-TETRACAINE-BENZOCAINE 2-2-14 % EX AERO
INHALATION_SPRAY | CUTANEOUS | Status: DC | PRN
  Administered 2022-07-16: 2 via TOPICAL

## 2022-07-16 MED ORDER — PROPOFOL 500 MG/50ML IV EMUL
INTRAVENOUS | Status: DC | PRN
  Administered 2022-07-16: 50 ug/kg/min via INTRAVENOUS

## 2022-07-16 MED ORDER — SODIUM CHLORIDE 0.9 % IV SOLN: INTRAVENOUS | Status: DC

## 2022-07-16 MED ORDER — POTASSIUM CHLORIDE 10 MEQ/100ML IV SOLN
10.0000 meq | INTRAVENOUS | Status: AC
  Administered 2022-07-16 (×2): 10 meq via INTRAVENOUS
  Filled 2022-07-16 (×2): qty 100

## 2022-07-16 MED ORDER — PHENYLEPHRINE HCL-NACL 20-0.9 MG/250ML-% IV SOLN
INTRAVENOUS | Status: DC | PRN
  Administered 2022-07-16: 25 ug/min via INTRAVENOUS

## 2022-07-16 MED ORDER — MAGNESIUM OXIDE -MG SUPPLEMENT 400 (240 MG) MG PO TABS
400.0000 mg | ORAL_TABLET | Freq: Two times a day (BID) | ORAL | Status: AC
Start: 2022-07-16 — End: 2022-07-16
  Administered 2022-07-16 (×2): 400 mg via ORAL
  Filled 2022-07-16 (×2): qty 1

## 2022-07-16 MED ORDER — DEXMEDETOMIDINE (PRECEDEX) IN NS 20 MCG/5ML (4 MCG/ML) IV SYRINGE
PREFILLED_SYRINGE | INTRAVENOUS | Status: DC | PRN
  Administered 2022-07-16: 4 ug via INTRAVENOUS

## 2022-07-16 NOTE — Consult Note (Signed)
CARDIOLOGY CONSULT NOTE  Patient ID: Erika Fry MRN: [id number] DOB/AGE: 01/16/1946 76 y.o.  Admit date: 07/08/2022 Referring Physician  Noemi Chapel, DO Primary Physician:  [name], MD Reason for Consultation  Endocarditis  Patient ID: Erika Fry, female    DOB: 04-12-1946, 76 y.o.   MRN: [id number]  Chief Complaint  Patient presents with   Weakness   HPI:    Erika Fry  is a 76 y.o. Erika Fry  is a 76 y.o. Caucasian female with hypertension, CKD now on HD, type 2 DM, hyperlipidemia, s/p Medtronic dual chamber pacemaker for symptomatic high grade AV block, moderate aortic valve stenosis, hypothyroidism, anemia, osteopenia, bilateral chronic lower extremity venous insufficiency.  Admit to the hospital on 07/15/2022 with fatigue and generalized weakness ongoing for the past 2 to 3 days however on questioning, patient states over the past 4 to 6 weeks she has not felt well.  She also complains of abdominal discomfort.  She was admitted to the hospital for further evaluation, and CT of the abdomen did not reveal any acute abnormality.  Due to leukocytosis, chronic diastolic heart failure and attic valve stenosis, echocardiogram transthoracic was performed revealing aortic valve vegetation and also probable vegetation on the tricuspid valve.  We will consult for evaluation and possible need for TEE.  Patient states that she is presently doing better since being in the hospital.  Denies chest pain or dyspnea.  Underwent dialysis this morning without any complications or hypotension.  Past Medical History:  Diagnosis Date   Arthritis    "knees, thumbs" (03/25/2018)   AV block, [place] 2 03/25/2018   CKD (chronic kidney disease), stage IV (HCC)    Complication of anesthesia    Reports had hard time waking up in the past   Diet-controlled diabetes mellitus ([place])    Encounter for care of pacemaker 09/07/2019   End stage renal failure on dialysis ([place])     T, Th, Sat [place]   GERD (gastroesophageal reflux disease)    Gout    "on daily RX" (03/25/2018)   Heart murmur    High cholesterol    History of COVID-19 12/2019   Hypothyroidism    Iron deficiency anemia    "had to get an iron infusion"   Migraine    "used to have them growing up" (03/25/2018)   Pneumonia 12/2019   with COVID   Presence of permanent cardiac pacemaker 03/25/2018   medtronic dual pacemaker: Dr Crissie Sickles   Past Surgical History:  Procedure Laterality Date   APPENDECTOMY     ARTERY BIOPSY Right 06/25/2021   Procedure: TEMPORAL ARTERY BIOPSY RIGHT;  Surgeon: Armandina Gemma, MD;  Location: [facility];  Service: General;  Laterality: Right;  LMA   AV FISTULA PLACEMENT Right 06/29/2019   Procedure: ARTERIOVENOUS (AV) FISTULA CREATION RIGHT UPPER  ARM;  Surgeon: Waynetta Sandy, MD;  Location: [place];  Service: Vascular;  Laterality: Right;   [place] Right 09/08/2019   Procedure: BASILIC VEIN TRANSPOSITION RIGHT SECOND STAGE;  Surgeon: Waynetta Sandy, MD;  Location: [place];  Service: Vascular;  Laterality: Right;   BIOPSY  05/19/2018   Procedure: BIOPSY;  Surgeon: Ladene Artist, MD;  Location: [place];  Service: Endoscopy;;   CHOLECYSTECTOMY N/A 08/09/2021   Procedure: LAPAROSCOPIC CHOLECYSTECTOMY;  Surgeon: Jesusita Oka, MD;  Location: [place];  Service: General;  Laterality: N/A;   CYSTOSCOPY W/ RETROGRADES Bilateral 01/14/2019   Procedure: CYSTOSCOPY WITH RETROGRADE PYELOGRAM BILATERAL  HYDRODISTENTION;  Surgeon: Ardis Hughs, MD;  Location: [facility];  Service: Urology;  Laterality: Bilateral;   ESOPHAGOGASTRODUODENOSCOPY N/A 05/19/2018   Procedure: ESOPHAGOGASTRODUODENOSCOPY (EGD);  Surgeon: Ladene Artist, MD;  Location: [facility];  Service: Endoscopy;  Laterality: N/A;   ESOPHAGOGASTRODUODENOSCOPY (EGD) WITH PROPOFOL N/A 01/28/2019   Procedure: ESOPHAGOGASTRODUODENOSCOPY (EGD) WITH PROPOFOL;  Surgeon: Rush Landmark  Telford Nab., MD;  Location: [place];  Service: Gastroenterology;  Laterality: N/A;   INSERT / REPLACE / REMOVE PACEMAKER  03/25/2018   IR FLUORO GUIDE CV LINE RIGHT  05/18/2018   IR FLUORO GUIDE CV LINE RIGHT  06/24/2019   IR LUMBAR DISC ASPIRATION W/IMG GUIDE  05/15/2018   IR NEPHROSTOMY EXCHANGE RIGHT  05/28/2019   IR NEPHROSTOMY EXCHANGE RIGHT  07/19/2019   IR NEPHROSTOMY PLACEMENT RIGHT  04/09/2019   IR REMOVAL TUN CV CATH W/O FL  07/23/2018   IR US GUIDE VASC ACCESS RIGHT  05/18/2018   IR US GUIDE VASC ACCESS RIGHT  06/24/2019   KNEE ARTHROSCOPY Bilateral    PACEMAKER IMPLANT N/A 03/25/2018   Procedure: PACEMAKER IMPLANT;  Surgeon: Evans Lance, MD;  Location: [place] CV LAB;  Service: Cardiovascular;  Laterality: N/A;   PACEMAKER PLACEMENT Left 03/2018   POLYPECTOMY  01/28/2019   Procedure: POLYPECTOMY;  Surgeon: Mansouraty, Telford Nab., MD;  Location: [place];  Service: Gastroenterology;;   RIGHT HEART CATH N/A 03/20/2018   Procedure: RIGHT HEART CATH;  Surgeon: Nigel Mormon, MD;  Location: [place] [facility];  Service: Cardiovascular;  Laterality: N/A;   TEE WITHOUT CARDIOVERSION N/A 03/20/2018   Procedure: TRANSESOPHAGEAL ECHOCARDIOGRAM (TEE);  Surgeon: Nigel Mormon, MD;  Location: [facility] ENDOSCOPY;  Service: Cardiovascular;  Laterality: N/A;   TONSILLECTOMY     URETEROSCOPY WITH HOLMIUM LASER LITHOTRIPSY Right 01/14/2019   Procedure: URETEROSCOPY WITH HOLMIUM LASER LITHOTRIPSY BLADDER BIOPSIES RIGHT STENT PLACEMENT;  Surgeon: Ardis Hughs, MD;  Location: [facility];  Service: Urology;  Laterality: Right;   Social History   Tobacco Use   Smoking status: Never   Smokeless tobacco: Never  Substance Use Topics   Alcohol use: Not Currently    Family History  Problem Relation Age of Onset   Hypertension Mother    Diabetes Mellitus II Father    Heart disease Father    Heart attack Father    Gastric cancer Brother    Diabetes Mellitus II Brother    Stomach  cancer Brother     Marital Status: Widowed  ROS  Review of Systems  Constitutional: Positive for malaise/fatigue.  Cardiovascular:  Positive for leg swelling (chronic). Negative for chest pain and dyspnea on exertion.   Objective      07/05/2022   11:16 AM 07/09/2022   11:00 AM 07/28/2022    6:30 AM  Vitals with BMI  Systolic 95 97 93  Diastolic 51 43 50  Pulse 66 67 61    Blood pressure (!) 95/51, pulse 66, temperature 98 F (36.7 C), temperature source Temporal, resp. rate 15, height '5\' 5"'$  (1.651 m), weight 80.2 kg, SpO2 100 %.    Physical Exam Constitutional:      Comments: Morbidly obese in no acute distress.  Neck:     Vascular: No carotid bruit or JVD.  Cardiovascular:     Rate and Rhythm: Normal rate and regular rhythm.     Pulses: Intact distal pulses.          Carotid pulses are 2+ on the right side and 2+ on the left  side.    Heart sounds: Murmur heard.     Crescendo midsystolic murmur is present with a grade of 2/6 at the upper right sternal border radiating to the apex.     No gallop.  Pulmonary:     Effort: Pulmonary effort is normal.     Breath sounds: Normal breath sounds.  Abdominal:     General: Bowel sounds are normal.     Palpations: Abdomen is soft.     Comments: Obese. Pannus present  Musculoskeletal:        General: Tenderness (Diffuse tenderness of the lower extremity due to neuropathy) present.     Right lower leg: Edema (1-2+ pittin) present.     Left lower leg: Edema (1-2 + pitting) present.  Skin:    Findings: Lesion (Chronic venous stasis dermatitis changes bilateral) present.    Laboratory examination:   Recent Labs    07/14/22 1003 07/15/22 0705 07/23/2022 0732  NA 126* 128* 129*  K 3.6 3.3* 2.9*  CL 85* 92* 94*  CO2 18* 16* 19*  GLUCOSE 193* 154* 115*  BUN 57* 48* 46*  CREATININE 8.12* 7.18* 6.91*  CALCIUM 9.5 9.3 9.1  GFRNONAA 5* 5* 6*   estimated creatinine clearance is 7 mL/min (A) (by C-G formula based on SCr of 6.91  mg/dL (H)).     Latest Ref Rng & Units 07/19/2022    7:32 AM 07/15/2022    7:05 AM 07/14/2022   10:03 AM  CMP  Glucose 70 - 99 mg/dL 115  154  193   BUN 8 - 23 mg/dL 46  48  57   Creatinine 0.44 - 1.00 mg/dL 6.91  7.18  8.12   Sodium 135 - 145 mmol/L 129  128  126   Potassium 3.5 - 5.1 mmol/L 2.9  3.3  3.6   Chloride 98 - 111 mmol/L 94  92  85   CO2 22 - 32 mmol/L '19  16  18   '$ Calcium 8.9 - 10.3 mg/dL 9.1  9.3  9.5       Latest Ref Rng & Units 07/12/2022    1:08 AM 07/15/2022    7:05 AM 07/14/2022   12:14 AM  CBC  WBC 4.0 - 10.5 K/uL 12.9  16.1  23.8   Hemoglobin 12.0 - 15.0 g/dL 10.9  11.7  12.3   Hematocrit 36.0 - 46.0 % 33.2  35.7  36.3   Platelets 150 - 400 K/uL 143  187  189    Lipid Panel No results for input(s): "CHOL", "TRIG", "[place]", "VLDL", "HDL", "CHOLHDL", "LDLDIRECT" in the last 8760 hours.  HEMOGLOBIN A1C Lab Results  Component Value Date   HGBA1C 6.0 (H) 07/14/2022   MPG 125.5 07/14/2022   TSH Recent Labs    08/07/21 0010 07/15/22 1037  TSH 1.184 4.533*   BNP (last 3 results) Recent Labs    08/06/21 1726 07/15/22 1037  BNP 142.4* 967.3*     Medications and allergies   Allergies  Allergen Reactions   Lioresal [Baclofen] Other (See Comments)    Somnolence "Put me into a coma and almost killed me"    Tape Other (See Comments)    Very sensitive skin, will tear easliy   Other Other (See Comments)    Patient has a high tolerance to antibiotics- has taken a lot of them during the course of her life   Codeine Other (See Comments)    Increased pain and couldn't sleep   Robitussin Cold Cough+ Chest [Dextromethorphan-Guaifenesin]  Other (See Comments)    Unknown reaction     Current Meds  Medication Sig   acetaminophen (TYLENOL) 325 MG tablet Take 650 mg by mouth every 6 (six) hours as needed for moderate pain or headache.   albuterol (VENTOLIN HFA) 108 (90 Base) MCG/ACT inhaler INHALE 2 PUFFS BY MOUTH EVERY 4 TO 6 HOURS AS NEEDED FOR WHEEZING,  SEE ADMIN INSTRUCTIONS (Patient taking differently: Inhale 2 puffs into the lungs every 4 (four) hours as needed for shortness of breath or wheezing.)   B Complex-C-Zn-Folic Acid (DIALYVITE 332 WITH ZINC) 0.8 MG TABS Take 1 tablet by mouth daily.    Biotin 10000 MCG TABS Take 10,000 mcg by mouth daily with breakfast.   calcitRIOL (ROCALTROL) 0.5 MCG capsule Take 0.5 mcg by mouth daily.   cetirizine (ZYRTEC) 10 MG tablet Take 10 mg by mouth at bedtime.    Cyanocobalamin (VITAMIN B12) 1000 MCG TBCR Take 1,000 mcg by mouth daily.   diazepam (VALIUM) 10 MG tablet 10 mg See admin instructions. 10 mg vaginally every night as needed for spasms   escitalopram (LEXAPRO) 5 MG tablet Take 5 mg by mouth daily.   ezetimibe (ZETIA) 10 MG tablet Take 1 tablet (10 mg total) by mouth daily.   ferric citrate (AURYXIA) 1 GM 210 MG(Fe) tablet Take 420 mg by mouth 3 (three) times daily with meals.    fluticasone (FLONASE) 50 MCG/ACT nasal spray Place 2 sprays into both nostrils daily as needed for allergies or rhinitis.   HYDROcodone-acetaminophen (NORCO/VICODIN) 5-325 MG tablet Take 1 tablet by mouth every 6 (six) hours as needed for up to 20 doses for severe pain. (Patient taking differently: Take 1 tablet by mouth every 4 (four) hours as needed (pain).)   levothyroxine (SYNTHROID) 75 MCG tablet Take 1 tablet (75 mcg total) by mouth daily before breakfast.   meloxicam (MOBIC) 15 MG tablet Take 15 mg by mouth daily.   naloxone (NARCAN) nasal spray 4 mg/0.1 mL Place 1 spray into the nose as needed (for overdose).   omeprazole (PRILOSEC) 20 MG capsule TAKE 1 CAPSULE BY MOUTH ONCE DAILY BEFORE BREAKFAST (Patient taking differently: Take 20 mg by mouth daily.)   senna (SENOKOT) 8.6 MG TABS tablet Take 1 tablet by mouth at bedtime.   simvastatin (ZOCOR) 40 MG tablet Take 1 tablet (40 mg total) by mouth at bedtime.    Scheduled Meds:  [MAR Hold] Chlorhexidine Gluconate Cloth  6 each Topical Daily   [MAR Hold] docusate  sodium  100 mg Oral BID   [MAR Hold] escitalopram  5 mg Oral Daily   [MAR Hold] ezetimibe  10 mg Oral Daily   [MAR Hold] ferric citrate  420 mg Oral TID WC   [MAR Hold] gentamicin cream  1 Application Topical Daily   [MAR Hold] insulin aspart  0-6 Units Subcutaneous TID WC   [MAR Hold] levothyroxine  75 mcg Oral QAC breakfast   [MAR Hold] midodrine  10 mg Oral TID with meals   [MAR Hold] pantoprazole  40 mg Oral Daily   [MAR Hold] senna  1 tablet Oral Daily   [MAR Hold] simvastatin  40 mg Oral QHS   [MAR Hold] sodium bicarbonate  650 mg Oral BID   [MAR Hold] vancomycin variable dose per unstable renal function (pharmacist dosing)   Does not apply See admin instructions   Continuous Infusions:  sodium chloride Stopped (07/14/22 0109)   [MAR Hold] sodium chloride Stopped (07/15/22 2254)   sodium chloride 20 mL/hr at 07/25/2022  1157   [MAR Hold] ceFEPime (MAXIPIME) IV Stopped (07/15/22 2155)   [MAR Hold] dialysis solution 1.5% low-MG/low-CA     heparin 950 Units/hr (07/27/2022 0600)   [MAR Hold] potassium chloride 10 mEq (07/11/2022 1025)   PRN Meds:.Place/Maintain arterial line **AND** [MAR Hold] sodium chloride, [MAR Hold] acetaminophen, [MAR Hold] albuterol, butamben-tetracaine-benzocaine, diazepam, [MAR Hold] HYDROcodone-acetaminophen, [MAR Hold] lip balm, [MAR Hold] mouth rinse, [MAR Hold] polyethylene glycol   I/O last 3 completed shifts: In: 2880.9 [P.O.:960; I.V.:1720.9; IV Piggyback:200.1] Out: 0  Total I/O In: 65 [I.V.:38] Out: -     Radiology:   VAS [place] LOWER EXTREMITY VENOUS (DVT)  Result Date: 07/15/2022  Lower Venous DVT Study Patient Name:  Erika Fry  Date of Exam:   07/15/2022 Medical Rec #: [id number]           Accession #:    [id number] Date of Birth: [birth year]           Patient Gender: F Patient Age:   77 years Exam Location:  [facility] Procedure:      VAS [place] LOWER EXTREMITY VENOUS (DVT) Referring Phys: Jenny Reichmann PAYNE  --------------------------------------------------------------------------------  Indications: Swelling.  Risk Factors: Limitations: Poor ultrasound/tissue interface and patient pain tolerance. Comparison Study: No prior studies. Performing Technologist: Oliver Hum RVT  Examination Guidelines: A complete evaluation includes B-mode imaging, spectral Doppler, color Doppler, and power Doppler as needed of all accessible portions of each vessel. Bilateral testing is considered an integral part of a complete examination. Limited examinations for reoccurring indications may be performed as noted. The reflux portion of the exam is performed with the patient in reverse Trendelenburg.  +---------+---------------+---------+-----------+----------+-------------------+ RIGHT    CompressibilityPhasicitySpontaneityPropertiesThrombus Aging      +---------+---------------+---------+-----------+----------+-------------------+ CFV      Full           Yes      Yes                                      +---------+---------------+---------+-----------+----------+-------------------+ SFJ      Full                                                             +---------+---------------+---------+-----------+----------+-------------------+ FV Prox  Full                                                             +---------+---------------+---------+-----------+----------+-------------------+ FV Mid   Full                                                             +---------+---------------+---------+-----------+----------+-------------------+ FV DistalFull                                                             +---------+---------------+---------+-----------+----------+-------------------+  PFV      Full                                                             +---------+---------------+---------+-----------+----------+-------------------+ POP      Full           Yes      Yes                                       +---------+---------------+---------+-----------+----------+-------------------+ PTV      Full                                                             +---------+---------------+---------+-----------+----------+-------------------+ PERO                                                  Patency shown with                                                        color doppler       +---------+---------------+---------+-----------+----------+-------------------+   +---------+---------------+---------+-----------+----------+--------------+ LEFT     CompressibilityPhasicitySpontaneityPropertiesThrombus Aging +---------+---------------+---------+-----------+----------+--------------+ CFV      Full           Yes      Yes                                 +---------+---------------+---------+-----------+----------+--------------+ SFJ      Full                                                        +---------+---------------+---------+-----------+----------+--------------+ FV Prox  Full                                                        +---------+---------------+---------+-----------+----------+--------------+ FV Mid   Full                                                        +---------+---------------+---------+-----------+----------+--------------+ FV DistalFull                                                        +---------+---------------+---------+-----------+----------+--------------+  PFV      Full                                                        +---------+---------------+---------+-----------+----------+--------------+ POP      Full           Yes      Yes                                 +---------+---------------+---------+-----------+----------+--------------+ PTV      Full                                                         +---------+---------------+---------+-----------+----------+--------------+ PERO     Full                                                        +---------+---------------+---------+-----------+----------+--------------+     Summary: RIGHT: - There is no evidence of deep vein thrombosis in the lower extremity. However, portions of this examination were limited- see technologist comments above.  - No cystic structure found in the popliteal fossa.  LEFT: - There is no evidence of deep vein thrombosis in the lower extremity. However, portions of this examination were limited- see technologist comments above.  - No cystic structure found in the popliteal fossa.  *See table(s) above for measurements and observations. Electronically signed by Servando Snare MD on 07/15/2022 at 5:41:15 PM.    Final    ECHOCARDIOGRAM COMPLETE  Result Date: 07/15/2022    ECHOCARDIOGRAM REPORT   Patient Name:   Erika Fry Date of Exam: 07/15/2022 Medical Rec #:  [id number]          Height:       65.0 in Accession #:    [id number]         Weight:       176.8 lb Date of Birth:  May 06, 1946          BSA:          1.877 m Patient Age:    69 years           BP:           93/54 mmHg Patient Gender: F                  HR:           60 bpm. Exam Location:  Inpatient Procedure: 2D Echo, Color Doppler, Cardiac Doppler and 3D Echo Indications:    Septic Shock  History:        Patient has prior history of Echocardiogram examinations, most                 recent 08/13/2021. CHF; Risk Factors:Diabetes and Dyslipidemia.  Sonographer:    Raquel Sarna Senior RDCS Referring Phys: [phone] [facility] IMPRESSIONS  1. Left ventricular ejection fraction, by estimation, is 60 to 65%. The left ventricle has normal function.  The left ventricle has no regional wall motion abnormalities. There is severe asymmetric left ventricular hypertrophy of the basal-septal segment. Left ventricular diastolic parameters are consistent with Grade I diastolic dysfunction  (impaired relaxation). Elevated left ventricular end-diastolic pressure.  2. There appears to be possible mobile vegeation or thrombus associated with the pacemaker lead. Right ventricular systolic function is low normal. The right ventricular size is normal.  3. Left atrial size was moderately dilated.  4. Large calcified mass on the posterior leafet. The mitral valve is abnormal. Trivial mitral valve regurgitation. Mild mitral stenosis. The mean mitral valve gradient is 5.0 mmHg with average heart rate of 60 bpm. Moderate mitral annular calcification.  5. The aortic valve is abnormal. There is moderate calcification of the aortic valve. There is moderate thickening of the aortic valve. Aortic valve regurgitation is mild to moderate. Moderate to severe aortic valve stenosis based on gradients, however,  more visually moderate - while there is severe subvalvular septal hypertrophy, the LVOT gradient is not significantly elevated at rest suggestive of HOCM- valsalva was not performed  6. Aortic valve area, by VTI measures 0.86 cm. Aortic valve mean gradient measures 43.5 mmHg. Aortic valve Vmax measures 4.20 m/s. DI is 0.27, consistent with moderate to severe AS.  7. Large mobile thrombus (or possible thrombotic vegetation) in the proximal aorta measuring 1.69 x .38 cm. There is Severe, mobile (Grade V) protruding plaque involving the ascending aorta. Comparison(s): Changes from prior study are noted. 08/13/2021: LVEF >75%, severe biatrial enlargment, mild to moderate AS. Conclusion(s)/Recommendation(s): Findings concerning for thrombus/vegetation noted associated with the pacemaker lead, however, there is also a mobile mass in the proximal ascending aorta, would recommend Transesophageal Echocardiogram for clarification.  Critical findings reported to Dr. Carlis Abbott and acknowledged at 2:48 pm. FINDINGS  Left Ventricle: Left ventricular ejection fraction, by estimation, is 60 to 65%. The left ventricle has normal  function. The left ventricle has no regional wall motion abnormalities. The left ventricular internal cavity size was normal in size. There is  severe asymmetric left ventricular hypertrophy of the basal-septal segment. Left ventricular diastolic parameters are consistent with Grade I diastolic dysfunction (impaired relaxation). Elevated left ventricular end-diastolic pressure. Right Ventricle: There appears to be possible mobile vegeation or thrombus associated with the pacemaker lead. The right ventricular size is normal. No increase in right ventricular wall thickness. Right ventricular systolic function is low normal. Left Atrium: Left atrial size was moderately dilated. Right Atrium: Right atrial size was not well visualized. Pericardium: There is no evidence of pericardial effusion. Mitral Valve: Large calcified mass on the posterior leafet. The mitral valve is abnormal. Moderate mitral annular calcification. Trivial mitral valve regurgitation. Mild mitral valve stenosis. MV peak gradient, 9.5 mmHg. The mean mitral valve gradient is  5.0 mmHg with average heart rate of 60 bpm. Tricuspid Valve: The tricuspid valve is grossly normal. Tricuspid valve regurgitation is mild. Aortic Valve: The aortic valve is abnormal. There is moderate calcification of the aortic valve. There is moderate thickening of the aortic valve. Aortic valve regurgitation is mild to moderate. Moderate to severe aortic stenosis is present. Aortic valve  mean gradient measures 43.5 mmHg. Aortic valve peak gradient measures 70.6 mmHg. Aortic valve area, by VTI measures 0.86 cm. Pulmonic Valve: The pulmonic valve was not well visualized. Pulmonic valve regurgitation is not visualized. Aorta: Large mobile thrombus (or possible thrombotic vegetation) in the proximal aorta measuring 1.69 x .38 cm. The aortic root and ascending aorta are structurally normal, with no evidence of  dilitation. There is severe, mobile (Grade V) protruding plaque  involving the ascending aorta. Venous: The inferior vena cava was not well visualized. IAS/Shunts: The interatrial septum was not well visualized. Additional Comments: A device lead is visualized.  LEFT VENTRICLE PLAX 2D LVIDd:         4.20 cm   Diastology LVIDs:         2.80 cm   LV e' medial:    3.81 cm/s LV PW:         1.10 cm   LV E/e' medial:  39.9 LV IVS:        1.40 cm   LV e' lateral:   5.11 cm/s LVOT diam:     2.00 cm   LV E/e' lateral: 29.7 LV SV:         86 LV SV Index:   46 LVOT Area:     3.14 cm  RIGHT VENTRICLE RV S prime:     10.70 cm/s TAPSE (M-mode): 2.1 cm LEFT ATRIUM             Index        RIGHT ATRIUM           Index LA diam:        4.00 cm 2.13 cm/m   RA Area:     13.80 cm LA Vol (A2C):   98.2 ml 52.31 ml/m  RA Volume:   31.30 ml  16.67 ml/m LA Vol (A4C):   53.9 ml 28.71 ml/m LA Biplane Vol: 75.9 ml 40.43 ml/m  AORTIC VALVE AV Area (Vmax):    0.87 cm AV Area (Vmean):   0.83 cm AV Area (VTI):     0.86 cm AV Vmax:           420.00 cm/s AV Vmean:          310.500 cm/s AV VTI:            0.999 m AV Peak Grad:      70.6 mmHg AV Mean Grad:      43.5 mmHg LVOT Vmax:         116.00 cm/s LVOT Vmean:        82.200 cm/s LVOT VTI:          0.274 m LVOT/AV VTI ratio: 0.27  AORTA Ao Root diam: 3.10 cm MITRAL VALVE                TRICUSPID VALVE MV Area (PHT): 2.23 cm     TR Peak grad:   13.2 mmHg MV Area VTI:   1.53 cm     TR Vmax:        182.00 cm/s MV Peak grad:  9.5 mmHg MV Mean grad:  5.0 mmHg     SHUNTS MV Vmax:       1.54 m/s     Systemic VTI:  0.27 m MV Vmean:      105.0 cm/s   Systemic Diam: 2.00 cm MV Decel Time: 340 msec MV E velocity: 152.00 cm/s MV A velocity: 145.00 cm/s MV E/A ratio:  1.05 Lyman Bishop MD Electronically signed by Lyman Bishop MD Signature Date/Time: 07/15/2022/2:53:07 PM    Final     Cardiac Studies:   Echocardiogram 07/15/2022:  1. Left ventricular ejection fraction, by estimation, is 60 to 65%. The left ventricle has normal function. The left ventricle has  no regional wall motion abnormalities. There is severe asymmetric left ventricular hypertrophy of the basal-septal  segment. Left ventricular diastolic parameters  are consistent with Grade I diastolic dysfunction (impaired relaxation). Elevated left ventricular end-diastolic pressure.  2. There appears to be possible mobile vegeation or thrombus associated with the pacemaker lead. Right ventricular systolic function is low normal. The right ventricular size is normal.  3. Left atrial size was moderately dilated.  4. Large calcified mass on the posterior leafet. The mitral valve is abnormal. Trivial mitral valve regurgitation. Mild mitral stenosis. The mean mitral valve gradient is 5.0 mmHg with average heart rate of 60 bpm. Moderate mitral annular calcification.  5. The aortic valve is abnormal. There is moderate calcification of the aortic valve. There is moderate thickening of the aortic valve. Aortic valve regurgitation is mild to moderate. Moderate to severe aortic valve stenosis based on gradients, however,  more visually moderate - while there is severe subvalvular septal hypertrophy, the LVOT gradient is not significantly elevated at rest suggestive of HOCM- valsalva was not performed  6. Aortic valve area, by VTI measures 0.86 cm. Aortic valve mean gradient measures 43.5 mmHg. Aortic valve Vmax measures 4.20 m/s. DI is 0.27, consistent with moderate to severe AS.  7. Large mobile thrombus (or possible thrombotic vegetation) in the proximal aorta measuring 1.69 x .38 cm. There is Severe, mobile (Grade V) protruding plaque involving the ascending aorta.  EKG:  EKG 07/23/2022: AV paced rhythm.  No further analysis.  Assessment   1.  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  Complete heart block, SP dual-chamber pacemaker implantation. 3.  End-stage renal disease on hemodialysis.  Recommendations:   Patient presently scheduled for TEE today.  All questions  answered.  We will make further recommendations following TEE.  Fortunately she is not in any acute decompensated heart failure.  Do not suspect any acute coronary syndromes as well.  We will continue to follow.   Adrian Prows, MD, [facility] 07/18/2022, 12:03 PM Office: [phone]

## 2022-07-16 NOTE — Evaluation (Signed)
Physical Therapy Evaluation Patient Details Name: Erika Fry MRN: [id number] DOB: Apr 29, 1946 Today's Date: 07/10/2022  History of Present Illness  Patient is a 76 year old female presents to [facility] ED on 8/12 with generalized weakness.  Pt with septic shock, hypotension and questionable vegetation on pacer lead in aorta.  Pt for TEE 8/15.  PMH of ESRD on peritoneal dialysis, DMT2, anemia, thrombocytopenia, HFpEF, pacemaker  Clinical Impression  Pt admitted with above diagnosis. Pt evaluation limited by inability to get pt up due to Endoscopy came to get pt for TEE.  Pt has fair strength and was able to move all 4 extremiites. She states she fatigues and gets SOB with activity.  Will follow and progress pt as able. Should progress well and be able to go home with HHPT and family support.  Pt currently with functional limitations due to the deficits listed below (see PT Problem List). Pt will benefit from skilled PT to increase their independence and safety with mobility to allow discharge to the venue listed below.          Recommendations for follow up therapy are one component of a multi-disciplinary discharge planning process, led by the attending physician.  Recommendations may be updated based on patient status, additional functional criteria and insurance authorization.  Follow Up Recommendations Home health PT      Assistance Recommended at Discharge Set up Supervision/Assistance  Patient can return home with the following  A little help with walking and/or transfers;Help with stairs or ramp for entrance;Assist for transportation;Assistance with cooking/housework    Equipment Recommendations None recommended by PT  Recommendations for Other Services       Functional Status Assessment Patient has had a recent decline in their functional status and demonstrates the ability to make significant improvements in function in a reasonable and predictable amount of time.     Precautions  / Restrictions Precautions Precautions: Fall Restrictions Weight Bearing Restrictions: No      Mobility  Bed Mobility Overal bed mobility: Needs Assistance Bed Mobility: Rolling Rolling: Supervision         General bed mobility comments: Pt can roll without assist.    Transfers                        Ambulation/Gait                  Stairs            Wheelchair Mobility    Modified Rankin (Stroke Patients Only)       Balance                                             Pertinent Vitals/Pain Pain Assessment Pain Assessment: No/denies pain    Home Living Family/patient expects to be discharged to:: Private residence Living Arrangements: Children (son, daughter in law and 4 kids live there temporarily) Available Help at Discharge: Family;Available PRN/intermittently (son works and kids are in college here and there) Type of Home: House Home Access: Stairs to enter Entrance Stairs-Rails: Right Entrance Stairs-Number of Steps: 1   Home Layout: Able to live on main level with bedroom/bathroom;Laundry or work area in [place]: Conservation officer, nature (2 wheels);Cane - single point;Wheelchair - Psychologist, sport and exercise (4 wheels);BSC/3in1;Shower seat;Grab bars - tub/shower;Adaptive equipment;Hospital bed;Grab bars - toilet      Prior Function  Mobility Comments: used rollator at home as she would get SOB and have to sit down. ADLs Comments: B/D self, doesnt drive but did still cook.     Hand Dominance   Dominant Hand: Right    Extremity/Trunk Assessment        Lower Extremity Assessment Lower Extremity Assessment: RLE deficits/detail;LLE deficits/detail RLE Sensation: history of peripheral neuropathy LLE Sensation: history of peripheral neuropathy       Communication   Communication: HOH  Cognition Arousal/Alertness: Awake/alert Behavior During Therapy: WFL for tasks  assessed/performed Overall Cognitive Status: Within Functional Limits for tasks assessed                                          General Comments General comments (skin integrity, edema, etc.): 66 bpm, 93% RA, 97/43    Exercises General Exercises - Upper Extremity Shoulder Flexion: AROM, Both, 5 reps, Supine Shoulder Extension: AROM, Both, 5 reps, Supine Elbow Flexion: AROM, Both, 5 reps, Supine Elbow Extension: AROM, Both, 5 reps, Supine General Exercises - Lower Extremity Ankle Circles/Pumps: AROM, Both, 5 reps, Supine Quad Sets: AROM, Both, 5 reps, Supine Heel Slides: AROM, Both, 5 reps, Supine Straight Leg Raises: AROM, Both, 5 reps, Supine   Assessment/Plan    PT Assessment Patient needs continued PT services  PT Problem List Decreased activity tolerance;Decreased balance;Decreased mobility;Decreased knowledge of use of DME;Decreased safety awareness;Decreased knowledge of precautions;Cardiopulmonary status limiting activity       PT Treatment Interventions DME instruction;Gait training;Functional mobility training;Therapeutic activities;Therapeutic exercise;Balance training;Patient/family education;Stair training    PT Goals (Current goals can be found in the Care Plan section)  Acute Rehab PT Goals Patient Stated Goal: to go home PT Goal Formulation: With patient Time For Goal Achievement: 08/20/2022 Potential to Achieve Goals: Fair    Frequency Min 3X/week     Co-evaluation               AM-PAC PT "6 Clicks" Mobility  Outcome Measure Help needed turning from your back to your side while in a flat bed without using bedrails?: None Help needed moving from lying on your back to sitting on the side of a flat bed without using bedrails?: None Help needed moving to and from a bed to a chair (including a wheelchair)?: A Little Help needed standing up from a chair using your arms (e.g., wheelchair or bedside chair)?: A Little Help needed to walk  in hospital room?: A Lot Help needed climbing 3-5 steps with a railing? : A Lot 6 Click Score: 18    End of Session   Activity Tolerance: Patient tolerated treatment well (Endo came to get pt and had to terminate session) Patient left: in bed;with call bell/phone within reach Nurse Communication: Mobility status PT Visit Diagnosis: Muscle weakness (generalized) (M62.81)    Time: 8786-7672 PT Time Calculation (min) (ACUTE ONLY): 11 min   Charges:   PT Evaluation $PT Eval Moderate Complexity: 1 Mod          Ibrahem Volkman M,PT Acute Rehab Services [phone]   Alvira Philips 07/06/2022, 12:07 PM

## 2022-07-16 NOTE — Progress Notes (Signed)
PD tx initation note:   Pre TX VS: BP 90/8mHg, HR 76bpm, RR 17brpm, T 97.924fSats 100% RA.  Pre TX weight: 79.9kg  PD treatment initiated via aseptic technique. Consent signed and in chart. Patient is alert and oriented. No complaints of pain. No specimen collected. PD exit site clean, dry and intact. Gentamycin and new dressing applied. Bedside RN educated on PD machine and how to contact tech support when PD machine alarms.

## 2022-07-16 NOTE — Progress Notes (Addendum)
NAME:  Erika Fry, MRN:  [id number], DOB:  1946/02/01, LOS: 3 ADMISSION DATE:  07/25/2022, CONSULTATION DATE:  8/12 REFERRING MD:  Dr. Ashok Cordia, CHIEF COMPLAINT:  Hypotension   History of Present Illness:  Patient is a 76 year old female with pertinent PMH of ESRD on peritoneal dialysis, DMT2, anemia, thrombocytopenia, HFpEF, pacemaker in place presents to [facility] ED on 8/12 with generalized weakness.  Patient states she has been noticing fatigue and generalized weakness for the past 2 to 3 days.  Has had decreased appetite and not drinking as much due to generalized abdominal tenderness.  She has been doing her peritoneal dialysis.  Denies fever, chest pain, N/V/D.  States she has stopped taking home midodrine for about a week. Came to [facility] ED on 8/12 for further work-up.  Upon arrival to [facility] ED, BP 82/54.  Other vitals stable and on room air.  Afebrile.  Patient given IV fluids.  Patient states she felt better after fluids but BP remained low.  CXR with no significant findings.  CT abdomen/pelvis with no acute abnormality.  WBC 27.5, Hgb 15.7, NA 131, creat 8.22, BUN 50, alk phos 178, AG 23, CO2 20.  COVID/flu negative.  LA 2.7.  BC x2 obtained and patient started on cefepime/Vanco.  PCCM consulted for ICU admission given hypotension.  Pertinent  Medical History   Past Medical History:  Diagnosis Date   Arthritis    "knees, thumbs" (03/25/2018)   AV block, Mobitz 2 03/25/2018   CKD (chronic kidney disease), stage IV (HCC)    Complication of anesthesia    Reports had hard time waking up in the past   Diet-controlled diabetes mellitus ([place])    Encounter for care of pacemaker 09/07/2019   End stage renal failure on dialysis ([place])    T, Th, Sat [place]   GERD (gastroesophageal reflux disease)    Gout    "on daily RX" (03/25/2018)   Heart murmur    High cholesterol    History of COVID-19 12/2019   Hypothyroidism    Iron deficiency anemia    "had to get an iron infusion"   Migraine     "used to have them growing up" (03/25/2018)   Pneumonia 12/2019   with COVID   Presence of permanent cardiac pacemaker 03/25/2018   medtronic dual pacemaker: Dr Crissie Sickles     [facility] Events: Including procedures, antibiotic start and stop dates in addition to other pertinent events   8/12: admitted to [facility] w/ hypotension 8/14 - TTE showing pacemaker lead and aortic vegetation/thrombus. Cardiology consulted  Interim History / Subjective:  Doing well. Complains of chronic sharp vaginal pain. Says she used to take oxycodone for pain. No other complaints or questions at this time.   Objective   Blood pressure (!) 93/50, pulse 61, temperature 97.7 F (36.5 C), temperature source Oral, resp. rate 15, height $RemoveBe'5\' 5"'[id number]$  (1.651 m), weight 80.2 kg, SpO2 95 %.        Intake/Output Summary (Last 24 hours) at 07/06/2022 0853 Last data filed at 07/31/2022 0600 Gross per 24 hour  Intake 896.1 ml  Output 0 ml  Net 896.1 ml   Filed Weights   07/20/2022 1737 07/14/22 0107 07/15/22 0800  Weight: 68 kg 72.8 kg 80.2 kg   Physical Exam: General: Elderly-appearing, no acute distress HENT: [place], AT, OP clear, MMM Eyes: EOMI, no scleral icterus Respiratory: Clear to auscultation bilaterally.  No crackles, wheezing or rales Cardiovascular: RRR, -M/R/G, no JVD GI: BS+, soft, nontender,  PD cath covered Extremities: Trace pedal edema, venous stasis in LE, bilateral ankle tenderness Neuro: AAO x4, CNII-XII grossly intact GU: Foley in place   Resolved Hospital Problem list     Assessment & Plan:   Septic Shock:  Sepsis due to possible endocarditis.  Off Levo. Maintaining MAPs with midodrine. WBC curve improving with abx. -Wean levophed for MAP goal >60 -F/u blood and peritoneal cultures -Continue antibiotics: Vanc/cefepime -Trend WBC/fever curve  Possible Endocarditis TTE showing thrombus on pacemaker lead and ascending aorta. Cardiology consulted Hemodynamically stable - plan TEE  today - continue therapeutic heparin - continue IV abx  ESRD on peritoneal dialysis AGMA Does not make urine. PD as at home. Nephro consulted -dialysis per nephro -daily weights; strict I/o's -Trend BMP -Replace electrolytes as indicated -Avoid nephrotoxic agents, ensure adequate renal perfusion  HFpEF AV block w/ Pacemaker in place HLD -telemetry monitoring -cont statin and zetia -continue home midodrine 10 TID  Hyponatremia Likely hypovolemia related -trend bmp  Hypokalemia - repleted  B/L chronic venous stasis Pain on palpation with exam. DVT US negative.  DMT2? on no home meds; diet controlled  Anemia of chronic disease Thrombocytopenia - monitor for signs of bleeding - transfuse if hgb <7 -Trend CBC  Hypothyroidism - home synthroid - consider check thyroid  Best Practice (right click and "Reselect all SmartList Selections" daily)   Diet/type: Regular consistency (see orders) DVT prophylaxis: prophylactic heparin  GI prophylaxis: PPI Lines: N/A Foley:  N/A Code Status:  full code Last date of multidisciplinary goals of care discussion [8/13 spoke with patient and updated at bedside]  Critical care time: 46 minutes    The patient is critically ill with septic shock and requires high complexity decision making for assessment and support, frequent evaluation and titration of therapies, application of advanced monitoring technologies and extensive interpretation of multiple databases.  Independent Critical Care Time: 40 Minutes.   Delene Ruffini, MD

## 2022-07-16 NOTE — H&P (View-Only) (Signed)
CARDIOLOGY CONSULT NOTE  Patient ID: Erika Fry MRN: [id number] DOB/AGE: [birth year] 78 y.o.  Admit date: 07/18/2022 Referring Physician  Noemi Chapel, DO Primary Physician:  [name], MD Reason for Consultation  Endocarditis  Patient ID: Erika Fry, female    DOB: 1946/02/28, 76 y.o.   MRN: [id number]  Chief Complaint  Patient presents with   Weakness   HPI:    Erika Fry  is a 76 y.o. Erika Fry  is a 76 y.o. Caucasian female with hypertension, CKD now on HD, type 2 DM, hyperlipidemia, s/p Medtronic dual chamber pacemaker for symptomatic high grade AV block, moderate aortic valve stenosis, hypothyroidism, anemia, osteopenia, bilateral chronic lower extremity venous insufficiency.  Admit to the hospital on 07/07/2022 with fatigue and generalized weakness ongoing for the past 2 to 3 days however on questioning, patient states over the past 4 to 6 weeks she has not felt well.  She also complains of abdominal discomfort.  She was admitted to the hospital for further evaluation, and CT of the abdomen did not reveal any acute abnormality.  Due to leukocytosis, chronic diastolic heart failure and attic valve stenosis, echocardiogram transthoracic was performed revealing aortic valve vegetation and also probable vegetation on the tricuspid valve.  We will consult for evaluation and possible need for TEE.  Patient states that she is presently doing better since being in the hospital.  Denies chest pain or dyspnea.  Underwent dialysis this morning without any complications or hypotension.  Past Medical History:  Diagnosis Date   Arthritis    "knees, thumbs" (03/25/2018)   AV block, [place] 2 03/25/2018   CKD (chronic kidney disease), stage IV (HCC)    Complication of anesthesia    Reports had hard time waking up in the past   Diet-controlled diabetes mellitus ([place])    Encounter for care of pacemaker 09/07/2019   End stage renal failure on dialysis ([place])     T, Th, Sat [place]   GERD (gastroesophageal reflux disease)    Gout    "on daily RX" (03/25/2018)   Heart murmur    High cholesterol    History of COVID-19 12/2019   Hypothyroidism    Iron deficiency anemia    "had to get an iron infusion"   Migraine    "used to have them growing up" (03/25/2018)   Pneumonia 12/2019   with COVID   Presence of permanent cardiac pacemaker 03/25/2018   medtronic dual pacemaker: Dr Crissie Sickles   Past Surgical History:  Procedure Laterality Date   APPENDECTOMY     ARTERY BIOPSY Right 06/25/2021   Procedure: TEMPORAL ARTERY BIOPSY RIGHT;  Surgeon: Armandina Gemma, MD;  Location: [facility];  Service: General;  Laterality: Right;  LMA   AV FISTULA PLACEMENT Right 06/29/2019   Procedure: ARTERIOVENOUS (AV) FISTULA CREATION RIGHT UPPER  ARM;  Surgeon: Waynetta Sandy, MD;  Location: [facility];  Service: Vascular;  Laterality: Right;   [place] Right 09/08/2019   Procedure: BASILIC VEIN TRANSPOSITION RIGHT SECOND STAGE;  Surgeon: Waynetta Sandy, MD;  Location: [facility];  Service: Vascular;  Laterality: Right;   BIOPSY  05/19/2018   Procedure: BIOPSY;  Surgeon: Ladene Artist, MD;  Location: [facility];  Service: Endoscopy;;   CHOLECYSTECTOMY N/A 08/09/2021   Procedure: LAPAROSCOPIC CHOLECYSTECTOMY;  Surgeon: Jesusita Oka, MD;  Location: [facility];  Service: General;  Laterality: N/A;   CYSTOSCOPY W/ RETROGRADES Bilateral 01/14/2019   Procedure: CYSTOSCOPY WITH RETROGRADE PYELOGRAM BILATERAL  HYDRODISTENTION;  Surgeon: Ardis Hughs, MD;  Location: [facility];  Service: Urology;  Laterality: Bilateral;   ESOPHAGOGASTRODUODENOSCOPY N/A 05/19/2018   Procedure: ESOPHAGOGASTRODUODENOSCOPY (EGD);  Surgeon: Ladene Artist, MD;  Location: [facility];  Service: Endoscopy;  Laterality: N/A;   ESOPHAGOGASTRODUODENOSCOPY (EGD) WITH PROPOFOL N/A 01/28/2019   Procedure: ESOPHAGOGASTRODUODENOSCOPY (EGD) WITH PROPOFOL;  Surgeon: Rush Landmark  Telford Nab., MD;  Location: [facility];  Service: Gastroenterology;  Laterality: N/A;   INSERT / REPLACE / REMOVE PACEMAKER  03/25/2018   IR FLUORO GUIDE CV LINE RIGHT  05/18/2018   IR FLUORO GUIDE CV LINE RIGHT  06/24/2019   IR LUMBAR DISC ASPIRATION W/IMG GUIDE  05/15/2018   IR NEPHROSTOMY EXCHANGE RIGHT  05/28/2019   IR NEPHROSTOMY EXCHANGE RIGHT  07/19/2019   IR NEPHROSTOMY PLACEMENT RIGHT  04/09/2019   IR REMOVAL TUN CV CATH W/O FL  07/23/2018   IR US GUIDE VASC ACCESS RIGHT  05/18/2018   IR US GUIDE VASC ACCESS RIGHT  06/24/2019   KNEE ARTHROSCOPY Bilateral    PACEMAKER IMPLANT N/A 03/25/2018   Procedure: PACEMAKER IMPLANT;  Surgeon: Evans Lance, MD;  Location: [facility];  Service: Cardiovascular;  Laterality: N/A;   PACEMAKER PLACEMENT Left 03/2018   POLYPECTOMY  01/28/2019   Procedure: POLYPECTOMY;  Surgeon: Mansouraty, Telford Nab., MD;  Location: [facility];  Service: Gastroenterology;;   RIGHT HEART CATH N/A 03/20/2018   Procedure: RIGHT HEART CATH;  Surgeon: Nigel Mormon, MD;  Location: [facility];  Service: Cardiovascular;  Laterality: N/A;   TEE WITHOUT CARDIOVERSION N/A 03/20/2018   Procedure: TRANSESOPHAGEAL ECHOCARDIOGRAM (TEE);  Surgeon: Nigel Mormon, MD;  Location: [facility];  Service: Cardiovascular;  Laterality: N/A;   TONSILLECTOMY     URETEROSCOPY WITH HOLMIUM LASER LITHOTRIPSY Right 01/14/2019   Procedure: URETEROSCOPY WITH HOLMIUM LASER LITHOTRIPSY BLADDER BIOPSIES RIGHT STENT PLACEMENT;  Surgeon: Ardis Hughs, MD;  Location: [facility];  Service: Urology;  Laterality: Right;   Social History   Tobacco Use   Smoking status: Never   Smokeless tobacco: Never  Substance Use Topics   Alcohol use: Not Currently    Family History  Problem Relation Age of Onset   Hypertension Mother    Diabetes Mellitus II Father    Heart disease Father    Heart attack Father    Gastric cancer Brother    Diabetes Mellitus II Brother    Stomach  cancer Brother     Marital Status: Widowed  ROS  Review of Systems  Constitutional: Positive for malaise/fatigue.  Cardiovascular:  Positive for leg swelling (chronic). Negative for chest pain and dyspnea on exertion.   Objective      07/06/2022   11:16 AM 07/27/2022   11:00 AM 07/26/2022    6:30 AM  Vitals with BMI  Systolic 95 97 93  Diastolic 51 43 50  Pulse 66 67 61    Blood pressure (!) 95/51, pulse 66, temperature 98 F (36.7 C), temperature source Temporal, resp. rate 15, height '5\' 5"'$  (1.651 m), weight 80.2 kg, SpO2 100 %.    Physical Exam Constitutional:      Comments: Morbidly obese in no acute distress.  Neck:     Vascular: No carotid bruit or JVD.  Cardiovascular:     Rate and Rhythm: Normal rate and regular rhythm.     Pulses: Intact distal pulses.          Carotid pulses are 2+ on the right side and 2+ on the left  side.    Heart sounds: Murmur heard.     Crescendo midsystolic murmur is present with a grade of 2/6 at the upper right sternal border radiating to the apex.     No gallop.  Pulmonary:     Effort: Pulmonary effort is normal.     Breath sounds: Normal breath sounds.  Abdominal:     General: Bowel sounds are normal.     Palpations: Abdomen is soft.     Comments: Obese. Pannus present  Musculoskeletal:        General: Tenderness (Diffuse tenderness of the lower extremity due to neuropathy) present.     Right lower leg: Edema (1-2+ pittin) present.     Left lower leg: Edema (1-2 + pitting) present.  Skin:    Findings: Lesion (Chronic venous stasis dermatitis changes bilateral) present.    Laboratory examination:   Recent Labs    07/14/22 1003 07/15/22 0705 07/12/2022 0732  NA 126* 128* 129*  K 3.6 3.3* 2.9*  CL 85* 92* 94*  CO2 18* 16* 19*  GLUCOSE 193* 154* 115*  BUN 57* 48* 46*  CREATININE 8.12* 7.18* 6.91*  CALCIUM 9.5 9.3 9.1  GFRNONAA 5* 5* 6*   estimated creatinine clearance is 7 mL/min (A) (by C-G formula based on SCr of 6.91  mg/dL (H)).     Latest Ref Rng & Units 07/11/2022    7:32 AM 07/15/2022    7:05 AM 07/14/2022   10:03 AM  CMP  Glucose 70 - 99 mg/dL 115  154  193   BUN 8 - 23 mg/dL 46  48  57   Creatinine 0.44 - 1.00 mg/dL 6.91  7.18  8.12   Sodium 135 - 145 mmol/L 129  128  126   Potassium 3.5 - 5.1 mmol/L 2.9  3.3  3.6   Chloride 98 - 111 mmol/L 94  92  85   CO2 22 - 32 mmol/L '19  16  18   '$ Calcium 8.9 - 10.3 mg/dL 9.1  9.3  9.5       Latest Ref Rng & Units 07/21/2022    1:08 AM 07/15/2022    7:05 AM 07/14/2022   12:14 AM  CBC  WBC 4.0 - 10.5 K/uL 12.9  16.1  23.8   Hemoglobin 12.0 - 15.0 g/dL 10.9  11.7  12.3   Hematocrit 36.0 - 46.0 % 33.2  35.7  36.3   Platelets 150 - 400 K/uL 143  187  189    Lipid Panel No results for input(s): "CHOL", "TRIG", "Iona", "VLDL", "HDL", "CHOLHDL", "LDLDIRECT" in the last 8760 hours.  HEMOGLOBIN A1C Lab Results  Component Value Date   HGBA1C 6.0 (H) 07/14/2022   MPG 125.5 07/14/2022   TSH Recent Labs    08/07/21 0010 07/15/22 1037  TSH 1.184 4.533*   BNP (last 3 results) Recent Labs    08/06/21 1726 07/15/22 1037  BNP 142.4* 967.3*     Medications and allergies   Allergies  Allergen Reactions   Lioresal [Baclofen] Other (See Comments)    Somnolence "Put me into a coma and almost killed me"    Tape Other (See Comments)    Very sensitive skin, will tear easliy   Other Other (See Comments)    Patient has a high tolerance to antibiotics- has taken a lot of them during the course of her life   Codeine Other (See Comments)    Increased pain and couldn't sleep   Robitussin Cold Cough+ Chest [Dextromethorphan-Guaifenesin]  Other (See Comments)    Unknown reaction     Current Meds  Medication Sig   acetaminophen (TYLENOL) 325 MG tablet Take 650 mg by mouth every 6 (six) hours as needed for moderate pain or headache.   albuterol (VENTOLIN HFA) 108 (90 Base) MCG/ACT inhaler INHALE 2 PUFFS BY MOUTH EVERY 4 TO 6 HOURS AS NEEDED FOR WHEEZING,  SEE ADMIN INSTRUCTIONS (Patient taking differently: Inhale 2 puffs into the lungs every 4 (four) hours as needed for shortness of breath or wheezing.)   B Complex-C-Zn-Folic Acid (DIALYVITE 222 WITH ZINC) 0.8 MG TABS Take 1 tablet by mouth daily.    Biotin 10000 MCG TABS Take 10,000 mcg by mouth daily with breakfast.   calcitRIOL (ROCALTROL) 0.5 MCG capsule Take 0.5 mcg by mouth daily.   cetirizine (ZYRTEC) 10 MG tablet Take 10 mg by mouth at bedtime.    Cyanocobalamin (VITAMIN B12) 1000 MCG TBCR Take 1,000 mcg by mouth daily.   diazepam (VALIUM) 10 MG tablet 10 mg See admin instructions. 10 mg vaginally every night as needed for spasms   escitalopram (LEXAPRO) 5 MG tablet Take 5 mg by mouth daily.   ezetimibe (ZETIA) 10 MG tablet Take 1 tablet (10 mg total) by mouth daily.   ferric citrate (AURYXIA) 1 GM 210 MG(Fe) tablet Take 420 mg by mouth 3 (three) times daily with meals.    fluticasone (FLONASE) 50 MCG/ACT nasal spray Place 2 sprays into both nostrils daily as needed for allergies or rhinitis.   HYDROcodone-acetaminophen (NORCO/VICODIN) 5-325 MG tablet Take 1 tablet by mouth every 6 (six) hours as needed for up to 20 doses for severe pain. (Patient taking differently: Take 1 tablet by mouth every 4 (four) hours as needed (pain).)   levothyroxine (SYNTHROID) 75 MCG tablet Take 1 tablet (75 mcg total) by mouth daily before breakfast.   meloxicam (MOBIC) 15 MG tablet Take 15 mg by mouth daily.   naloxone (NARCAN) nasal spray 4 mg/0.1 mL Place 1 spray into the nose as needed (for overdose).   omeprazole (PRILOSEC) 20 MG capsule TAKE 1 CAPSULE BY MOUTH ONCE DAILY BEFORE BREAKFAST (Patient taking differently: Take 20 mg by mouth daily.)   senna (SENOKOT) 8.6 MG TABS tablet Take 1 tablet by mouth at bedtime.   simvastatin (ZOCOR) 40 MG tablet Take 1 tablet (40 mg total) by mouth at bedtime.    Scheduled Meds:  [MAR Hold] Chlorhexidine Gluconate Cloth  6 each Topical Daily   [MAR Hold] docusate  sodium  100 mg Oral BID   [MAR Hold] escitalopram  5 mg Oral Daily   [MAR Hold] ezetimibe  10 mg Oral Daily   [MAR Hold] ferric citrate  420 mg Oral TID WC   [MAR Hold] gentamicin cream  1 Application Topical Daily   [MAR Hold] insulin aspart  0-6 Units Subcutaneous TID WC   [MAR Hold] levothyroxine  75 mcg Oral QAC breakfast   [MAR Hold] midodrine  10 mg Oral TID with meals   [MAR Hold] pantoprazole  40 mg Oral Daily   [MAR Hold] senna  1 tablet Oral Daily   [MAR Hold] simvastatin  40 mg Oral QHS   [MAR Hold] sodium bicarbonate  650 mg Oral BID   [MAR Hold] vancomycin variable dose per unstable renal function (pharmacist dosing)   Does not apply See admin instructions   Continuous Infusions:  sodium chloride Stopped (07/14/22 0109)   [MAR Hold] sodium chloride Stopped (07/15/22 2254)   sodium chloride 20 mL/hr at 07/23/2022  1157   [MAR Hold] ceFEPime (MAXIPIME) IV Stopped (07/15/22 2155)   [MAR Hold] dialysis solution 1.5% low-MG/low-CA     heparin 950 Units/hr (07/26/2022 0600)   [MAR Hold] potassium chloride 10 mEq (07/06/2022 1025)   PRN Meds:.Place/Maintain arterial line **AND** [MAR Hold] sodium chloride, [MAR Hold] acetaminophen, [MAR Hold] albuterol, butamben-tetracaine-benzocaine, diazepam, [MAR Hold] HYDROcodone-acetaminophen, [MAR Hold] lip balm, [MAR Hold] mouth rinse, [MAR Hold] polyethylene glycol   I/O last 3 completed shifts: In: 2880.9 [P.O.:960; I.V.:1720.9; IV Piggyback:200.1] Out: 0  Total I/O In: 69 [I.V.:38] Out: -     Radiology:   VAS [place] LOWER EXTREMITY VENOUS (DVT)  Result Date: 07/15/2022  Lower Venous DVT Study Patient Name:  Erika Fry  Date of Exam:   07/15/2022 Medical Rec #: [id number]           Accession #:    [id number] Date of Birth: Mar 28, 1946           Patient Gender: F Patient Age:   26 years Exam Location:  [facility] Procedure:      VAS [place] LOWER EXTREMITY VENOUS (DVT) Referring Phys: Jenny Reichmann PAYNE  --------------------------------------------------------------------------------  Indications: Swelling.  Risk Factors: Limitations: Poor ultrasound/tissue interface and patient pain tolerance. Comparison Study: No prior studies. Performing Technologist: Oliver Hum RVT  Examination Guidelines: A complete evaluation includes B-mode imaging, spectral Doppler, color Doppler, and power Doppler as needed of all accessible portions of each vessel. Bilateral testing is considered an integral part of a complete examination. Limited examinations for reoccurring indications may be performed as noted. The reflux portion of the exam is performed with the patient in reverse Trendelenburg.  +---------+---------------+---------+-----------+----------+-------------------+ RIGHT    CompressibilityPhasicitySpontaneityPropertiesThrombus Aging      +---------+---------------+---------+-----------+----------+-------------------+ CFV      Full           Yes      Yes                                      +---------+---------------+---------+-----------+----------+-------------------+ SFJ      Full                                                             +---------+---------------+---------+-----------+----------+-------------------+ FV Prox  Full                                                             +---------+---------------+---------+-----------+----------+-------------------+ FV Mid   Full                                                             +---------+---------------+---------+-----------+----------+-------------------+ FV DistalFull                                                             +---------+---------------+---------+-----------+----------+-------------------+  PFV      Full                                                             +---------+---------------+---------+-----------+----------+-------------------+ POP      Full           Yes      Yes                                       +---------+---------------+---------+-----------+----------+-------------------+ PTV      Full                                                             +---------+---------------+---------+-----------+----------+-------------------+ PERO                                                  Patency shown with                                                        color doppler       +---------+---------------+---------+-----------+----------+-------------------+   +---------+---------------+---------+-----------+----------+--------------+ LEFT     CompressibilityPhasicitySpontaneityPropertiesThrombus Aging +---------+---------------+---------+-----------+----------+--------------+ CFV      Full           Yes      Yes                                 +---------+---------------+---------+-----------+----------+--------------+ SFJ      Full                                                        +---------+---------------+---------+-----------+----------+--------------+ FV Prox  Full                                                        +---------+---------------+---------+-----------+----------+--------------+ FV Mid   Full                                                        +---------+---------------+---------+-----------+----------+--------------+ FV DistalFull                                                        +---------+---------------+---------+-----------+----------+--------------+  PFV      Full                                                        +---------+---------------+---------+-----------+----------+--------------+ POP      Full           Yes      Yes                                 +---------+---------------+---------+-----------+----------+--------------+ PTV      Full                                                         +---------+---------------+---------+-----------+----------+--------------+ PERO     Full                                                        +---------+---------------+---------+-----------+----------+--------------+     Summary: RIGHT: - There is no evidence of deep vein thrombosis in the lower extremity. However, portions of this examination were limited- see technologist comments above.  - No cystic structure found in the popliteal fossa.  LEFT: - There is no evidence of deep vein thrombosis in the lower extremity. However, portions of this examination were limited- see technologist comments above.  - No cystic structure found in the popliteal fossa.  *See table(s) above for measurements and observations. Electronically signed by Servando Snare MD on 07/15/2022 at 5:41:15 PM.    Final    ECHOCARDIOGRAM COMPLETE  Result Date: 07/15/2022    ECHOCARDIOGRAM REPORT   Patient Name:   Erika Fry Date of Exam: 07/15/2022 Medical Rec #:  [id number]          Height:       65.0 in Accession #:    [id number]         Weight:       176.8 lb Date of Birth:  [birth year]          BSA:          1.877 m Patient Age:    66 years           BP:           93/54 mmHg Patient Gender: F                  HR:           60 bpm. Exam Location:  Inpatient Procedure: 2D Echo, Color Doppler, Cardiac Doppler and 3D Echo Indications:    Septic Shock  History:        Patient has prior history of Echocardiogram examinations, most                 recent 08/13/2021. CHF; Risk Factors:Diabetes and Dyslipidemia.  Sonographer:    Raquel Sarna Senior RDCS Referring Phys: [id number] [facility] IMPRESSIONS  1. Left ventricular ejection fraction, by estimation, is 60 to 65%. The left ventricle has normal function.  The left ventricle has no regional wall motion abnormalities. There is severe asymmetric left ventricular hypertrophy of the basal-septal segment. Left ventricular diastolic parameters are consistent with Grade I diastolic dysfunction  (impaired relaxation). Elevated left ventricular end-diastolic pressure.  2. There appears to be possible mobile vegeation or thrombus associated with the pacemaker lead. Right ventricular systolic function is low normal. The right ventricular size is normal.  3. Left atrial size was moderately dilated.  4. Large calcified mass on the posterior leafet. The mitral valve is abnormal. Trivial mitral valve regurgitation. Mild mitral stenosis. The mean mitral valve gradient is 5.0 mmHg with average heart rate of 60 bpm. Moderate mitral annular calcification.  5. The aortic valve is abnormal. There is moderate calcification of the aortic valve. There is moderate thickening of the aortic valve. Aortic valve regurgitation is mild to moderate. Moderate to severe aortic valve stenosis based on gradients, however,  more visually moderate - while there is severe subvalvular septal hypertrophy, the LVOT gradient is not significantly elevated at rest suggestive of HOCM- valsalva was not performed  6. Aortic valve area, by VTI measures 0.86 cm. Aortic valve mean gradient measures 43.5 mmHg. Aortic valve Vmax measures 4.20 m/s. DI is 0.27, consistent with moderate to severe AS.  7. Large mobile thrombus (or possible thrombotic vegetation) in the proximal aorta measuring 1.69 x .38 cm. There is Severe, mobile (Grade V) protruding plaque involving the ascending aorta. Comparison(s): Changes from prior study are noted. 08/13/2021: LVEF >75%, severe biatrial enlargment, mild to moderate AS. Conclusion(s)/Recommendation(s): Findings concerning for thrombus/vegetation noted associated with the pacemaker lead, however, there is also a mobile mass in the proximal ascending aorta, would recommend Transesophageal Echocardiogram for clarification.  Critical findings reported to Dr. Carlis Abbott and acknowledged at 2:48 pm. FINDINGS  Left Ventricle: Left ventricular ejection fraction, by estimation, is 60 to 65%. The left ventricle has normal  function. The left ventricle has no regional wall motion abnormalities. The left ventricular internal cavity size was normal in size. There is  severe asymmetric left ventricular hypertrophy of the basal-septal segment. Left ventricular diastolic parameters are consistent with Grade I diastolic dysfunction (impaired relaxation). Elevated left ventricular end-diastolic pressure. Right Ventricle: There appears to be possible mobile vegeation or thrombus associated with the pacemaker lead. The right ventricular size is normal. No increase in right ventricular wall thickness. Right ventricular systolic function is low normal. Left Atrium: Left atrial size was moderately dilated. Right Atrium: Right atrial size was not well visualized. Pericardium: There is no evidence of pericardial effusion. Mitral Valve: Large calcified mass on the posterior leafet. The mitral valve is abnormal. Moderate mitral annular calcification. Trivial mitral valve regurgitation. Mild mitral valve stenosis. MV peak gradient, 9.5 mmHg. The mean mitral valve gradient is  5.0 mmHg with average heart rate of 60 bpm. Tricuspid Valve: The tricuspid valve is grossly normal. Tricuspid valve regurgitation is mild. Aortic Valve: The aortic valve is abnormal. There is moderate calcification of the aortic valve. There is moderate thickening of the aortic valve. Aortic valve regurgitation is mild to moderate. Moderate to severe aortic stenosis is present. Aortic valve  mean gradient measures 43.5 mmHg. Aortic valve peak gradient measures 70.6 mmHg. Aortic valve area, by VTI measures 0.86 cm. Pulmonic Valve: The pulmonic valve was not well visualized. Pulmonic valve regurgitation is not visualized. Aorta: Large mobile thrombus (or possible thrombotic vegetation) in the proximal aorta measuring 1.69 x .38 cm. The aortic root and ascending aorta are structurally normal, with no evidence of  dilitation. There is severe, mobile (Grade V) protruding plaque  involving the ascending aorta. Venous: The inferior vena cava was not well visualized. IAS/Shunts: The interatrial septum was not well visualized. Additional Comments: A device lead is visualized.  LEFT VENTRICLE PLAX 2D LVIDd:         4.20 cm   Diastology LVIDs:         2.80 cm   LV e' medial:    3.81 cm/s LV PW:         1.10 cm   LV E/e' medial:  39.9 LV IVS:        1.40 cm   LV e' lateral:   5.11 cm/s LVOT diam:     2.00 cm   LV E/e' lateral: 29.7 LV SV:         86 LV SV Index:   46 LVOT Area:     3.14 cm  RIGHT VENTRICLE RV S prime:     10.70 cm/s TAPSE (M-mode): 2.1 cm LEFT ATRIUM             Index        RIGHT ATRIUM           Index LA diam:        4.00 cm 2.13 cm/m   RA Area:     13.80 cm LA Vol (A2C):   98.2 ml 52.31 ml/m  RA Volume:   31.30 ml  16.67 ml/m LA Vol (A4C):   53.9 ml 28.71 ml/m LA Biplane Vol: 75.9 ml 40.43 ml/m  AORTIC VALVE AV Area (Vmax):    0.87 cm AV Area (Vmean):   0.83 cm AV Area (VTI):     0.86 cm AV Vmax:           420.00 cm/s AV Vmean:          310.500 cm/s AV VTI:            0.999 m AV Peak Grad:      70.6 mmHg AV Mean Grad:      43.5 mmHg LVOT Vmax:         116.00 cm/s LVOT Vmean:        82.200 cm/s LVOT VTI:          0.274 m LVOT/AV VTI ratio: 0.27  AORTA Ao Root diam: 3.10 cm MITRAL VALVE                TRICUSPID VALVE MV Area (PHT): 2.23 cm     TR Peak grad:   13.2 mmHg MV Area VTI:   1.53 cm     TR Vmax:        182.00 cm/s MV Peak grad:  9.5 mmHg MV Mean grad:  5.0 mmHg     SHUNTS MV Vmax:       1.54 m/s     Systemic VTI:  0.27 m MV Vmean:      105.0 cm/s   Systemic Diam: 2.00 cm MV Decel Time: 340 msec MV E velocity: 152.00 cm/s MV A velocity: 145.00 cm/s MV E/A ratio:  1.05 Lyman Bishop MD Electronically signed by Lyman Bishop MD Signature Date/Time: 07/15/2022/2:53:07 PM    Final     Cardiac Studies:   Echocardiogram 07/15/2022:  1. Left ventricular ejection fraction, by estimation, is 60 to 65%. The left ventricle has normal function. The left ventricle has  no regional wall motion abnormalities. There is severe asymmetric left ventricular hypertrophy of the basal-septal  segment. Left ventricular diastolic parameters  are consistent with Grade I diastolic dysfunction (impaired relaxation). Elevated left ventricular end-diastolic pressure.  2. There appears to be possible mobile vegeation or thrombus associated with the pacemaker lead. Right ventricular systolic function is low normal. The right ventricular size is normal.  3. Left atrial size was moderately dilated.  4. Large calcified mass on the posterior leafet. The mitral valve is abnormal. Trivial mitral valve regurgitation. Mild mitral stenosis. The mean mitral valve gradient is 5.0 mmHg with average heart rate of 60 bpm. Moderate mitral annular calcification.  5. The aortic valve is abnormal. There is moderate calcification of the aortic valve. There is moderate thickening of the aortic valve. Aortic valve regurgitation is mild to moderate. Moderate to severe aortic valve stenosis based on gradients, however,  more visually moderate - while there is severe subvalvular septal hypertrophy, the LVOT gradient is not significantly elevated at rest suggestive of HOCM- valsalva was not performed  6. Aortic valve area, by VTI measures 0.86 cm. Aortic valve mean gradient measures 43.5 mmHg. Aortic valve Vmax measures 4.20 m/s. DI is 0.27, consistent with moderate to severe AS.  7. Large mobile thrombus (or possible thrombotic vegetation) in the proximal aorta measuring 1.69 x .38 cm. There is Severe, mobile (Grade V) protruding plaque involving the ascending aorta.  EKG:  EKG 07/03/2022: AV paced rhythm.  No further analysis.  Assessment   1.  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  Complete heart block, SP dual-chamber pacemaker implantation. 3.  End-stage renal disease on hemodialysis.  Recommendations:   Patient presently scheduled for TEE today.  All questions  answered.  We will make further recommendations following TEE.  Fortunately she is not in any acute decompensated heart failure.  Do not suspect any acute coronary syndromes as well.  We will continue to follow.   Adrian Prows, MD, [facility] 07/06/2022, 12:03 PM Office: [phone]

## 2022-07-16 NOTE — Anesthesia Postprocedure Evaluation (Signed)
Anesthesia Post Note  Patient: Erika Fry  Procedure(s) Performed: TRANSESOPHAGEAL ECHOCARDIOGRAM (TEE)     Patient location during evaluation: Endoscopy Anesthesia Type: MAC Level of consciousness: awake and alert Pain management: pain level controlled Vital Signs Assessment: post-procedure vital signs reviewed and stable Respiratory status: spontaneous breathing, nonlabored ventilation and respiratory function stable Cardiovascular status: blood pressure returned to baseline and stable Postop Assessment: no apparent nausea or vomiting Anesthetic complications: no   No notable events documented.  Last Vitals:  Vitals:   07/09/2022 1248 07/15/2022 1250  BP:  (!) 92/52  Pulse: 74 74  Resp: 14 15  Temp:    SpO2: 97% 96%    Last Pain:  Vitals:   08/01/2022 1250  TempSrc:   PainSc: 0-No pain                 Lidia Collum

## 2022-07-16 NOTE — Progress Notes (Signed)
  Echocardiogram Echocardiogram Transesophageal has been performed.  Bobbye Charleston 07/19/2022, 12:54 PM

## 2022-07-16 NOTE — Transfer of Care (Signed)
Immediate Anesthesia Transfer of Care Note  Patient: Blaize Nipper Riches  Procedure(s) Performed: TRANSESOPHAGEAL ECHOCARDIOGRAM (TEE)  Patient Location: Endoscopy Unit  Anesthesia Type:MAC  Level of Consciousness: awake, drowsy and patient cooperative  Airway & Oxygen Therapy: Patient Spontanous Breathing and Patient connected to nasal cannula oxygen  Post-op Assessment: Report given to RN, Post -op Vital signs reviewed and stable and Patient moving all extremities X 4  Post vital signs: Reviewed and stable  Last Vitals:  Vitals Value Taken Time  BP 85/43 07/07/2022 1241  Temp    Pulse 73 07/20/2022 1244  Resp 15 07/02/2022 1244  SpO2 98 % 07/29/2022 1244  Vitals shown include unvalidated device data.  Last Pain:  Vitals:   07/31/2022 1116  TempSrc: Temporal  PainSc: 0-No pain      Patients Stated Pain Goal: 0 (88/75/79 7282)  Complications: No notable events documented.

## 2022-07-16 NOTE — Progress Notes (Signed)
[name] Progress Note    Assessment/ Plan:   ESRD on CCPD -will c/w nightly PD, will continue to utilize all 1.5% bags -caution w/ fluids -backup access: RUE AVF  Hypotension -acute on chronic. Possible sepsis, source unclear-abx per primary -pressor support per primary service, currently off -on midodrine as well -PD cell count reassuring-not indicating infection -TEE pending  Possible vegetation on pacer lead and aorta -TEE planned for today  Hypovolemia -on isotonic fluids  Hyperkalemia -resolved. Now low, can replete PRN  Hyponatremia -improved to 129 today  Anemia of CKD -hgb acceptable/at goal. Not on ESAs as outpatient. Avoiding IV Fe for now given possible endocarditis  CKD MBD -c/w auryxia -renal diet  B/L chronic venous stasis -per primary   OP PD Orders: Follows at [facility]. 7d/ wk, 75kg, 6 exchanges overnight, 2800cc , 1.5h dwell, no daybag - last Hb 12.9 on 8/2, last mircera 6/26 - last pth 1027, phos 9 - no esa or vdra  Gean Quint, MD [facility]  Subjective:   No acute events overnight. Tolerated PD overnight. Off levo. Found to have concerns for vegetation on pacer lead and possibly aorta, pending TEE today   Objective:   BP (!) 93/50   Pulse 61   Temp 97.7 F (36.5 C) (Oral)   Resp 15   Ht '5\' 5"'$  (1.651 m)   Wt 80.2 kg   SpO2 95%   BMI 29.42 kg/m   Intake/Output Summary (Last 24 hours) at 07/05/2022 0836 Last data filed at 07/10/2022 0600 Gross per 24 hour  Intake 896.1 ml  Output 0 ml  Net 896.1 ml   Weight change:   Physical Exam: Gen:nad CVS:RRR Resp:CTA BL Abd:PD site c/d/I, soft, nontender, nondistended IRJ:JOACZ pedal edema, sensitive to touch/tender to touch mostly in b/l ankles Neuro: awake, alert Dialysis access: PD cath, RUE AVF +b/t  Imaging: VAS [place] LOWER EXTREMITY VENOUS (DVT)  Result Date: 07/15/2022  Lower Venous DVT Study Patient Name:  Erika Fry  Date of Exam:    07/15/2022 Medical Rec #: [id number]           Accession #:    [id number] Date of Birth: [birth year]           Patient Gender: F Patient Age:   76 years Exam Location:  [facility] Procedure:      VAS [place] LOWER EXTREMITY VENOUS (DVT) Referring Phys: Jenny Reichmann PAYNE --------------------------------------------------------------------------------  Indications: Swelling.  Risk Factors: Limitations: Poor ultrasound/tissue interface and patient pain tolerance. Comparison Study: No prior studies. Performing Technologist: Oliver Hum RVT  Examination Guidelines: A complete evaluation includes B-mode imaging, spectral Doppler, color Doppler, and power Doppler as needed of all accessible portions of each vessel. Bilateral testing is considered an integral part of a complete examination. Limited examinations for reoccurring indications may be performed as noted. The reflux portion of the exam is performed with the patient in reverse Trendelenburg.  +---------+---------------+---------+-----------+----------+-------------------+ RIGHT    CompressibilityPhasicitySpontaneityPropertiesThrombus Aging      +---------+---------------+---------+-----------+----------+-------------------+ CFV      Full           Yes      Yes                                      +---------+---------------+---------+-----------+----------+-------------------+ SFJ      Full                                                             +---------+---------------+---------+-----------+----------+-------------------+  FV Prox  Full                                                             +---------+---------------+---------+-----------+----------+-------------------+ FV Mid   Full                                                             +---------+---------------+---------+-----------+----------+-------------------+ FV DistalFull                                                              +---------+---------------+---------+-----------+----------+-------------------+ PFV      Full                                                             +---------+---------------+---------+-----------+----------+-------------------+ POP      Full           Yes      Yes                                      +---------+---------------+---------+-----------+----------+-------------------+ PTV      Full                                                             +---------+---------------+---------+-----------+----------+-------------------+ PERO                                                  Patency shown with                                                        color doppler       +---------+---------------+---------+-----------+----------+-------------------+   +---------+---------------+---------+-----------+----------+--------------+ LEFT     CompressibilityPhasicitySpontaneityPropertiesThrombus Aging +---------+---------------+---------+-----------+----------+--------------+ CFV      Full           Yes      Yes                                 +---------+---------------+---------+-----------+----------+--------------+ SFJ      Full                                                        +---------+---------------+---------+-----------+----------+--------------+  FV Prox  Full                                                        +---------+---------------+---------+-----------+----------+--------------+ FV Mid   Full                                                        +---------+---------------+---------+-----------+----------+--------------+ FV DistalFull                                                        +---------+---------------+---------+-----------+----------+--------------+ PFV      Full                                                         +---------+---------------+---------+-----------+----------+--------------+ POP      Full           Yes      Yes                                 +---------+---------------+---------+-----------+----------+--------------+ PTV      Full                                                        +---------+---------------+---------+-----------+----------+--------------+ PERO     Full                                                        +---------+---------------+---------+-----------+----------+--------------+     Summary: RIGHT: - There is no evidence of deep vein thrombosis in the lower extremity. However, portions of this examination were limited- see technologist comments above.  - No cystic structure found in the popliteal fossa.  LEFT: - There is no evidence of deep vein thrombosis in the lower extremity. However, portions of this examination were limited- see technologist comments above.  - No cystic structure found in the popliteal fossa.  *See table(s) above for measurements and observations. Electronically signed by Servando Snare MD on 07/15/2022 at 5:41:15 PM.    Final    ECHOCARDIOGRAM COMPLETE  Result Date: 07/15/2022    ECHOCARDIOGRAM REPORT   Patient Name:   Erika Fry Date of Exam: 07/15/2022 Medical Rec #:  [id number]          Height:       65.0 in Accession #:    [id number]         Weight:       176.8 lb Date of Birth:  27-Oct-1946          BSA:          1.877 m Patient Age:    76 years           BP:           93/54 mmHg Patient Gender: F                  HR:           60 bpm. Exam Location:  Inpatient Procedure: 2D Echo, Color Doppler, Cardiac Doppler and 3D Echo Indications:    Septic Shock  History:        Patient has prior history of Echocardiogram examinations, most                 recent 08/13/2021. CHF; Risk Factors:Diabetes and Dyslipidemia.  Sonographer:    Raquel Sarna Senior RDCS Referring Phys: [phone] [facility] IMPRESSIONS  1. Left ventricular ejection fraction,  by estimation, is 60 to 65%. The left ventricle has normal function. The left ventricle has no regional wall motion abnormalities. There is severe asymmetric left ventricular hypertrophy of the basal-septal segment. Left ventricular diastolic parameters are consistent with Grade I diastolic dysfunction (impaired relaxation). Elevated left ventricular end-diastolic pressure.  2. There appears to be possible mobile vegeation or thrombus associated with the pacemaker lead. Right ventricular systolic function is low normal. The right ventricular size is normal.  3. Left atrial size was moderately dilated.  4. Large calcified mass on the posterior leafet. The mitral valve is abnormal. Trivial mitral valve regurgitation. Mild mitral stenosis. The mean mitral valve gradient is 5.0 mmHg with average heart rate of 60 bpm. Moderate mitral annular calcification.  5. The aortic valve is abnormal. There is moderate calcification of the aortic valve. There is moderate thickening of the aortic valve. Aortic valve regurgitation is mild to moderate. Moderate to severe aortic valve stenosis based on gradients, however,  more visually moderate - while there is severe subvalvular septal hypertrophy, the LVOT gradient is not significantly elevated at rest suggestive of HOCM- valsalva was not performed  6. Aortic valve area, by VTI measures 0.86 cm. Aortic valve mean gradient measures 43.5 mmHg. Aortic valve Vmax measures 4.20 m/s. DI is 0.27, consistent with moderate to severe AS.  7. Large mobile thrombus (or possible thrombotic vegetation) in the proximal aorta measuring 1.69 x .38 cm. There is Severe, mobile (Grade V) protruding plaque involving the ascending aorta. Comparison(s): Changes from prior study are noted. 08/13/2021: LVEF >75%, severe biatrial enlargment, mild to moderate AS. Conclusion(s)/Recommendation(s): Findings concerning for thrombus/vegetation noted associated with the pacemaker lead, however, there is also a  mobile mass in the proximal ascending aorta, would recommend Transesophageal Echocardiogram for clarification.  Critical findings reported to Dr. Carlis Abbott and acknowledged at 2:48 pm. FINDINGS  Left Ventricle: Left ventricular ejection fraction, by estimation, is 60 to 65%. The left ventricle has normal function. The left ventricle has no regional wall motion abnormalities. The left ventricular internal cavity size was normal in size. There is  severe asymmetric left ventricular hypertrophy of the basal-septal segment. Left ventricular diastolic parameters are consistent with Grade I diastolic dysfunction (impaired relaxation). Elevated left ventricular end-diastolic pressure. Right Ventricle: There appears to be possible mobile vegeation or thrombus associated with the pacemaker lead. The right ventricular size is normal. No increase in right ventricular wall thickness. Right ventricular systolic function is low normal. Left Atrium: Left atrial size was moderately dilated. Right Atrium: Right atrial  size was not well visualized. Pericardium: There is no evidence of pericardial effusion. Mitral Valve: Large calcified mass on the posterior leafet. The mitral valve is abnormal. Moderate mitral annular calcification. Trivial mitral valve regurgitation. Mild mitral valve stenosis. MV peak gradient, 9.5 mmHg. The mean mitral valve gradient is  5.0 mmHg with average heart rate of 60 bpm. Tricuspid Valve: The tricuspid valve is grossly normal. Tricuspid valve regurgitation is mild. Aortic Valve: The aortic valve is abnormal. There is moderate calcification of the aortic valve. There is moderate thickening of the aortic valve. Aortic valve regurgitation is mild to moderate. Moderate to severe aortic stenosis is present. Aortic valve  mean gradient measures 43.5 mmHg. Aortic valve peak gradient measures 70.6 mmHg. Aortic valve area, by VTI measures 0.86 cm. Pulmonic Valve: The pulmonic valve was not well visualized. Pulmonic  valve regurgitation is not visualized. Aorta: Large mobile thrombus (or possible thrombotic vegetation) in the proximal aorta measuring 1.69 x .38 cm. The aortic root and ascending aorta are structurally normal, with no evidence of dilitation. There is severe, mobile (Grade V) protruding plaque involving the ascending aorta. Venous: The inferior vena cava was not well visualized. IAS/Shunts: The interatrial septum was not well visualized. Additional Comments: A device lead is visualized.  LEFT VENTRICLE PLAX 2D LVIDd:         4.20 cm   Diastology LVIDs:         2.80 cm   LV e' medial:    3.81 cm/s LV PW:         1.10 cm   LV E/e' medial:  39.9 LV IVS:        1.40 cm   LV e' lateral:   5.11 cm/s LVOT diam:     2.00 cm   LV E/e' lateral: 29.7 LV SV:         86 LV SV Index:   46 LVOT Area:     3.14 cm  RIGHT VENTRICLE RV S prime:     10.70 cm/s TAPSE (M-mode): 2.1 cm LEFT ATRIUM             Index        RIGHT ATRIUM           Index LA diam:        4.00 cm 2.13 cm/m   RA Area:     13.80 cm LA Vol (A2C):   98.2 ml 52.31 ml/m  RA Volume:   31.30 ml  16.67 ml/m LA Vol (A4C):   53.9 ml 28.71 ml/m LA Biplane Vol: 75.9 ml 40.43 ml/m  AORTIC VALVE AV Area (Vmax):    0.87 cm AV Area (Vmean):   0.83 cm AV Area (VTI):     0.86 cm AV Vmax:           420.00 cm/s AV Vmean:          310.500 cm/s AV VTI:            0.999 m AV Peak Grad:      70.6 mmHg AV Mean Grad:      43.5 mmHg LVOT Vmax:         116.00 cm/s LVOT Vmean:        82.200 cm/s LVOT VTI:          0.274 m LVOT/AV VTI ratio: 0.27  AORTA Ao Root diam: 3.10 cm MITRAL VALVE                TRICUSPID VALVE MV Area (  PHT): 2.23 cm     TR Peak grad:   13.2 mmHg MV Area VTI:   1.53 cm     TR Vmax:        182.00 cm/s MV Peak grad:  9.5 mmHg MV Mean grad:  5.0 mmHg     SHUNTS MV Vmax:       1.54 m/s     Systemic VTI:  0.27 m MV Vmean:      105.0 cm/s   Systemic Diam: 2.00 cm MV Decel Time: 340 msec MV E velocity: 152.00 cm/s MV A velocity: 145.00 cm/s MV E/A ratio:  1.05  Lyman Bishop MD Electronically signed by Lyman Bishop MD Signature Date/Time: 07/15/2022/2:53:07 PM    Final     Labs: BMET Recent Labs  Lab 07/26/2022 1546 07/14/22 0014 07/14/22 1003 07/15/22 0705  NA 131* 126* 126* 128*  K 4.2 5.3* 3.6 3.3*  CL 88* 88* 85* 92*  CO2 20* 19* 18* 16*  GLUCOSE 153* 129* 193* 154*  BUN 50* 53* 57* 48*  CREATININE 8.22* 7.79* 8.12* 7.18*  CALCIUM 11.0* 9.2 9.5 9.3  PHOS  --   --  8.2*  --    CBC Recent Labs  Lab 07/26/2022 1546 07/14/22 0014 07/15/22 0705 07/31/2022 0108  WBC 27.5* 23.8* 16.1* 12.9*  NEUTROABS 21.2*  --   --   --   HGB 15.7* 12.3 11.7* 10.9*  HCT 45.9 36.3 35.7* 33.2*  MCV 93.1 93.8 94.4 96.2  PLT 253 189 187 143*    Medications:     Chlorhexidine Gluconate Cloth  6 each Topical Daily   docusate sodium  100 mg Oral BID   escitalopram  5 mg Oral Daily   ezetimibe  10 mg Oral Daily   ferric citrate  420 mg Oral TID WC   gentamicin cream  1 Application Topical Daily   insulin aspart  0-6 Units Subcutaneous TID WC   levothyroxine  75 mcg Oral QAC breakfast   midodrine  10 mg Oral TID with meals   pantoprazole  40 mg Oral Daily   senna  1 tablet Oral Daily   simvastatin  40 mg Oral QHS   sodium bicarbonate  650 mg Oral BID   vancomycin variable dose per unstable renal function (pharmacist dosing)   Does not apply See admin instructions      Gean Quint, MD [place] 08/01/2022, 8:36 AM

## 2022-07-16 NOTE — Anesthesia Preprocedure Evaluation (Signed)
Anesthesia Evaluation  Patient identified by MRN, date of birth, ID band Patient awake    Reviewed: Allergy & Precautions, NPO status , Patient's Chart, lab work & pertinent test results  History of Anesthesia Complications Negative for: history of anesthetic complications  Airway Mallampati: II  TM Distance: >3 FB Neck ROM: Full    Dental   Pulmonary neg pulmonary ROS,    Pulmonary exam normal        Cardiovascular +CHF  Normal cardiovascular exam+ pacemaker   Pacer lead thrombus  Echo 07/15/22: EF 60-65%, g1dd, possible mobile vegetation or thrombus associated with the pacemaker lead, low normal RVSF, trivial MR, mild MS, mild/mod AR, mod/sev AS, severe subvalvular septal hypertrophy suggestive of HOCM, AVA 0.86, MG 43   Neuro/Psych negative neurological ROS     GI/Hepatic Neg liver ROS, GERD  ,  Endo/Other  diabetes, Type 2Hypothyroidism   Renal/GU ESRF and DialysisRenal disease (PD)  negative genitourinary   Musculoskeletal  (+) Arthritis ,   Abdominal   Peds  Hematology  (+) Blood dyscrasia, anemia ,   Anesthesia Other Findings   Reproductive/Obstetrics                             Anesthesia Physical Anesthesia Plan  ASA: 4  Anesthesia Plan: MAC   Post-op Pain Management:    Induction: Intravenous  PONV Risk Score and Plan: 2 and Propofol infusion, TIVA and Treatment may vary due to age or medical condition  Airway Management Planned: Natural Airway, Nasal Cannula and Simple Face Mask  Additional Equipment: None  Intra-op Plan:   Post-operative Plan:   Informed Consent: I have reviewed the patients History and Physical, chart, labs and discussed the procedure including the risks, benefits and alternatives for the proposed anesthesia with the patient or authorized representative who has indicated his/her understanding and acceptance.       Plan Discussed with:    Anesthesia Plan Comments:         Anesthesia Quick Evaluation

## 2022-07-16 NOTE — CV Procedure (Signed)
TEE performed under MAC anesthesia.  Normal LVEF. Moderate aortic stenosis, mild to moderate aortic regurgitation.  Calcific aortic valve.  Mean gradient of 39 to 42 mmHg, gradients much higher than stenosis severity due to presence of mild to moderate aortic regurgitation. Moderate calcification of the aortic valve.  There is no vegetation.  Mitral valve shows moderate mitral regurgitation.  Tricuspid valve is normal, trace tricuspid regurgitation.  Pulmonary valve is normal.  Ascending aorta shows a complex plaque measuring about 2 cm.  It is mildly calcified.  It is attached to the ascending aortic wall.  Impression: No evidence of valvular vegetation.   Adrian Prows, MD, [facility] 07/24/2022, 12:34 PM Office: [phone] Fax: [phone] Pager: [phone]

## 2022-07-16 NOTE — Progress Notes (Signed)
[place] for Heparin Indication: possible aortic thrombus Brief A/P: Heparin level supratherapeutic. Decrease Heparin rate  Allergies  Allergen Reactions   Lioresal [Baclofen] Other (See Comments)    Somnolence "Put me into a coma and almost killed me"    Tape Other (See Comments)    Very sensitive skin, will tear easliy   Other Other (See Comments)    Patient has a high tolerance to antibiotics- has taken a lot of them during the course of her life   Codeine Other (See Comments)    Increased pain and couldn't sleep   Robitussin Cold Cough+ Chest [Dextromethorphan-Guaifenesin] Other (See Comments)    Unknown reaction    Patient Measurements: Height: '5\' 5"'$  (165.1 cm) Weight: 80.2 kg (176 lb 12.9 oz) IBW/kg (Calculated) : 57 Heparin Dosing Weight: 68 kg  Vital Signs: Temp: 98.7 F (37.1 C) (08/14 2330) Temp Source: Axillary (08/14 2330) BP: 81/69 (08/15 0130) Pulse Rate: 60 (08/15 0200)  Labs: Recent Labs    07/14/22 0014 07/14/22 1003 07/15/22 0705 07/22/2022 0108  HGB 12.3  --  11.7* 10.9*  HCT 36.3  --  35.7* 33.2*  PLT 189  --  187 143*  HEPARINUNFRC  --   --   --  0.88*  CREATININE 7.79* 8.12* 7.18*  --      Estimated Creatinine Clearance: 6.8 mL/min (A) (by C-G formula based on SCr of 7.18 mg/dL (H)).  Assessment: 76 y.o. female with possible aortic thrombus noted on ECHO for heparin  Goal of Therapy:  Heparin level 0.3-0.7 units/ml Monitor platelets by anticoagulation protocol: Yes   Plan:  Decrease Heparin 950 units/hr Check heparin level in 8 hours.   Phillis Knack, PharmD, BCPS  07/08/2022, 3:24 AM

## 2022-07-16 NOTE — Interval H&P Note (Signed)
History and Physical Interval Note:  07/31/2022 12:33 PM  Erika Fry  has presented today for surgery, with the diagnosis of aortic thrombus.  The various methods of treatment have been discussed with the patient and family. After consideration of risks, benefits and other options for treatment, the patient has consented to  Procedure(s): TRANSESOPHAGEAL ECHOCARDIOGRAM (TEE) (N/A) as a surgical intervention.  The patient's history has been reviewed, patient examined, no change in status, stable for surgery.  I have reviewed the patient's chart and labs.  Questions were answered to the patient's satisfaction.     Adrian Prows

## 2022-07-17 ENCOUNTER — Encounter (HOSPITAL_COMMUNITY): Payer: Self-pay | Admitting: Cardiology

## 2022-07-17 DIAGNOSIS — R102 Pelvic and perineal pain: Secondary | ICD-10-CM | POA: Diagnosis present

## 2022-07-17 DIAGNOSIS — D638 Anemia in other chronic diseases classified elsewhere: Secondary | ICD-10-CM

## 2022-07-17 DIAGNOSIS — E876 Hypokalemia: Secondary | ICD-10-CM | POA: Diagnosis not present

## 2022-07-17 DIAGNOSIS — N186 End stage renal disease: Secondary | ICD-10-CM | POA: Diagnosis not present

## 2022-07-17 DIAGNOSIS — A419 Sepsis, unspecified organism: Secondary | ICD-10-CM | POA: Diagnosis not present

## 2022-07-17 DIAGNOSIS — E039 Hypothyroidism, unspecified: Secondary | ICD-10-CM

## 2022-07-17 LAB — CBC
HCT: 32.7 % — ABNORMAL LOW (ref 36.0–46.0)
Hemoglobin: 11 g/dL — ABNORMAL LOW (ref 12.0–15.0)
MCH: 31.4 pg (ref 26.0–34.0)
MCHC: 33.6 g/dL (ref 30.0–36.0)
MCV: 93.4 fL (ref 80.0–100.0)
Platelets: 170 10*3/uL (ref 150–400)
RBC: 3.5 MIL/uL — ABNORMAL LOW (ref 3.87–5.11)
RDW: 14 % (ref 11.5–15.5)
WBC: 11.6 10*3/uL — ABNORMAL HIGH (ref 4.0–10.5)
nRBC: 0 % (ref 0.0–0.2)

## 2022-07-17 LAB — BASIC METABOLIC PANEL
Anion gap: 16 — ABNORMAL HIGH (ref 5–15)
BUN: 47 mg/dL — ABNORMAL HIGH (ref 8–23)
CO2: 21 mmol/L — ABNORMAL LOW (ref 22–32)
Calcium: 9.3 mg/dL (ref 8.9–10.3)
Chloride: 92 mmol/L — ABNORMAL LOW (ref 98–111)
Creatinine, Ser: 7.18 mg/dL — ABNORMAL HIGH (ref 0.44–1.00)
GFR, Estimated: 5 mL/min — ABNORMAL LOW (ref >60)
Glucose, Bld: 116 mg/dL — ABNORMAL HIGH (ref 70–99)
Potassium: 2.9 mmol/L — ABNORMAL LOW (ref 3.5–5.1)
Sodium: 129 mmol/L — ABNORMAL LOW (ref 135–145)

## 2022-07-17 LAB — MAGNESIUM: Magnesium: 1.8 mg/dL (ref 1.7–2.4)

## 2022-07-17 LAB — GLUCOSE, CAPILLARY
Glucose-Capillary: 124 mg/dL — ABNORMAL HIGH (ref 70–99)
Glucose-Capillary: 174 mg/dL — ABNORMAL HIGH (ref 70–99)
Glucose-Capillary: 91 mg/dL (ref 70–99)

## 2022-07-17 MED ORDER — POLYETHYLENE GLYCOL 3350 17 G PO PACK
17.0000 g | PACK | Freq: Every day | ORAL | Status: DC
  Administered 2022-07-17 – 2022-07-21 (×5): 17 g via ORAL
  Filled 2022-07-17 (×6): qty 1

## 2022-07-17 MED ORDER — SODIUM CHLORIDE 0.9 % IV SOLN: INTRAVENOUS | Status: DC | PRN

## 2022-07-17 MED ORDER — MAGNESIUM SULFATE 2 GM/50ML IV SOLN
2.0000 g | Freq: Once | INTRAVENOUS | Status: AC
  Administered 2022-07-17: 2 g via INTRAVENOUS
  Filled 2022-07-17: qty 50

## 2022-07-17 MED ORDER — POTASSIUM CHLORIDE CRYS ER 10 MEQ PO TBCR
20.0000 meq | EXTENDED_RELEASE_TABLET | Freq: Once | ORAL | Status: AC
  Administered 2022-07-17: 20 meq via ORAL
  Filled 2022-07-17: qty 2

## 2022-07-17 MED ORDER — SENNA 8.6 MG PO TABS
1.0000 | ORAL_TABLET | Freq: Every day | ORAL | Status: DC
  Administered 2022-07-17 – 2022-07-20 (×4): 8.6 mg via ORAL
  Filled 2022-07-17 (×4): qty 1

## 2022-07-17 MED ORDER — ONDANSETRON HCL 4 MG/2ML IJ SOLN
4.0000 mg | Freq: Four times a day (QID) | INTRAMUSCULAR | Status: DC | PRN
  Administered 2022-07-17 – 2022-07-22 (×2): 4 mg via INTRAVENOUS
  Filled 2022-07-17 (×2): qty 2

## 2022-07-17 NOTE — Progress Notes (Signed)
[facility] Progress Note    Assessment/ Plan:   ESRD on CCPD -will c/w nightly PD, will continue to utilize all 1.5% bags -caution w/ fluids -backup access: RUE AVF  Hypotension -acute on chronic. Possible sepsis, source unclear-abx per primary -pressor support per primary service, currently off -on midodrine as well -PD cell count reassuring-not indicating infection -EF 60-65% on TEE   Possible vegetation on pacer lead and aorta -TEE 8/15 complex plaque to ascending aortic wall. No valvular vegetation   Hypovolemia -on isotonic fluids  Hyperkalemia -resolved. Now low, can replete PRN -KCl ordered   Hyponatremia -improved to 129 today  Anemia of CKD -hgb acceptable/at goal. Not on ESAs as outpatient. Avoiding IV Fe for now given possible endocarditis  CKD MBD -c/w auryxia -renal diet  B/L chronic venous stasis -per primary   OP PD Orders: Follows at [facility]. 7d/ wk, 75kg, 6 exchanges overnight, 2800cc , 1.5h dwell, no daybag - last Hb 12.9 on 8/2, last mircera 6/26 - last pth 1027, phos 9 - no esa or vdra  Lynnda Child PA-C [facility] 07/17/2022,9:36 AM   Subjective:   Got on cycler late but no problems overnight.   Objective:   BP (!) 92/56 (BP Location: Left Leg)   Pulse 66   Temp 98.1 F (36.7 C) (Oral)   Resp 19   Ht '5\' 5"'$  (1.651 m)   Wt 84 kg   SpO2 99%   BMI 30.82 kg/m   Intake/Output Summary (Last 24 hours) at 07/17/2022 0933 Last data filed at 07/17/2022 0700 Gross per 24 hour  Intake 450.46 ml  Output --  Net 450.46 ml    Weight change: 3.8 kg  Physical Exam: Gen:nad CVS:RRR Resp:CTA BL Abd:PD site c/d/I, soft, nontender, nondistended ZOX:WRUEA pedal edema, sensitive to touch/tender to touch mostly in b/l ankles Neuro: awake, alert Dialysis access: PD cath, RUE AVF +b/t  Imaging: ECHO TEE  Result Date: 07/05/2022    TRANSESOPHOGEAL ECHO REPORT   Patient Name:   Erika Fry Date  of Exam: 07/11/2022 Medical Rec #:  [id number]          Height:       65.0 in Accession #:    [id number]         Weight:       176.8 lb Date of Birth:  07-23-1946          BSA:          1.877 m Patient Age:    76 years           BP:           85/43 mmHg Patient Gender: F                  HR:           61 bpm. Exam Location:  Inpatient Procedure: 3D Echo, Transesophageal Echo, Cardiac Doppler and Color Doppler Indications:     I35.2 Nonrheumatic aortic (valve) stenosis with insufficiency;                  I35.8 Other nonrheumatic aortic valve disorders  History:         Patient has prior history of Echocardiogram examinations, most                  recent 07/15/2022. Pacemaker, Aortic Valve Disease and Mitral                  Valve  Disease, Signs/Symptoms:Bacteremia, Fever and Edema; Risk                  Factors:Diabetes. Shock. Aortic stenosis.  Sonographer:     Roseanna Rainbow RDCS Referring Phys:  Reynold Bowen PATWARDHAN Diagnosing Phys: Adrian Prows MD PROCEDURE: After discussion of the risks and benefits of a TEE, an informed consent was obtained from the patient. The transesophogeal probe was passed without difficulty through the esophogus of the patient. Imaged were obtained with the patient in a left lateral decubitus position. Sedation performed by different physician. The patient was monitored while under deep sedation. Anesthestetic sedation was provided intravenously by Anesthesiology: '182mg'$  of Propofol. The patient's vital signs; including heart rate, blood pressure, and oxygen saturation; remained stable throughout the procedure. The patient developed no complications during the procedure.  IMPRESSIONS  1. Left ventricular ejection fraction, by estimation, is 60 to 65%. The left ventricle has normal function. The left ventricle has no regional wall motion abnormalities.  2. Right ventricular systolic function is normal. The right ventricular size is normal.  3. Left atrial size was moderately dilated. No left  atrial/left atrial appendage thrombus was detected.  4. Pacemaker lead noted.  5. The mass is separate from the AV and appears to have similar echogenicity of the pacemaker wire? complex atheroma measuring 2.1x0.7 cm?[name] Kitchen The pacemaker leads appear to be intact. . There is mild dilatation of the ascending aorta, measuring 37 mm. There is Severe, mobile (Grade V) protruding plaque involving the ascending aorta.  6. The aortic valve is calcified. There is severe calcifcation of the aortic valve. There is moderate thickening of the aortic valve. Aortic valve regurgitation is moderate. Moderate aortic valve stenosis. Aortic valve area, by VTI measures 0.83 cm. Aortic valve mean gradient measures 41.0 mmHg. Aortic valve Vmax measures 4.39 m/s.  7. The mitral valve is degenerative. Moderate mitral valve regurgitation. No evidence of mitral stenosis. Moderate mitral annular calcification.  8. Conclusion(s)/Recommendation(s): Procedure done in a relative short time due to complexity of the patient's physical condition. No TEE e/o Endocarditis. FINDINGS  Left Ventricle: Left ventricular ejection fraction, by estimation, is 60 to 65%. The left ventricle has normal function. The left ventricle has no regional wall motion abnormalities. The left ventricular internal cavity size was normal in size. There is  no left ventricular hypertrophy. Right Ventricle: The right ventricular size is normal. No increase in right ventricular wall thickness. Right ventricular systolic function is normal. Left Atrium: Left atrial size was moderately dilated. No left atrial/left atrial appendage thrombus was detected. Right Atrium: Right atrial size was normal in size. Pacemaker lead noted. Pericardium: There is no evidence of pericardial effusion. Mitral Valve: The mitral valve is degenerative in appearance. There is moderate thickening of the mitral valve leaflet(s). Mildly decreased mobility of the mitral valve leaflets. Moderate mitral annular  calcification. Moderate mitral valve regurgitation.  No evidence of mitral valve stenosis. Tricuspid Valve: The tricuspid valve is normal in structure. Tricuspid valve regurgitation is trivial. Aortic Valve: The aortic valve is calcified. There is severe calcifcation of the aortic valve. There is moderate thickening of the aortic valve. There is mild aortic valve annular calcification. Aortic valve regurgitation is moderate. Aortic regurgitation PHT measures 527 msec. Moderate aortic stenosis is present. Aortic valve mean gradient measures 41.0 mmHg. Aortic valve peak gradient measures 77.1 mmHg. Aortic valve area, by VTI measures 0.83 cm. Pulmonic Valve: The pulmonic valve was normal in structure. Pulmonic valve regurgitation is not visualized. No evidence of pulmonic  stenosis. Aorta: The mass is separate from the AV and appears to have similar echogenicity of the pacemaker wire? complex atheroma measuring 2.1x0.7 cm?[name] Kitchen The pacemaker leads appear to be intact. The aortic root is normal in size and structure. There is mild dilatation of the ascending aorta, measuring 37 mm. There is severe, mobile (Grade V) protruding plaque involving the ascending aorta. Venous: The inferior vena cava was not well visualized. IAS/Shunts: No atrial level shunt detected by color flow Doppler. Additional Comments: A device lead is visualized.  LEFT VENTRICLE PLAX 2D LVOT diam:     2.07 cm LV SV:         85 LV SV Index:   45 LVOT Area:     3.35 cm  AORTIC VALVE AV Area (Vmax):    0.85 cm AV Area (Vmean):   0.87 cm AV Area (VTI):     0.83 cm AV Vmax:           439.00 cm/s AV Vmean:          293.333 cm/s AV VTI:            1.020 m AV Peak Grad:      77.1 mmHg AV Mean Grad:      41.0 mmHg LVOT Vmax:         111.00 cm/s LVOT Vmean:        75.850 cm/s LVOT VTI:          0.252 m LVOT/AV VTI ratio: 0.25 AI PHT:            527 msec MR Peak grad:    101.6 mmHg MR Mean grad:    64.0 mmHg    SHUNTS MR Vmax:         504.00 cm/s  Systemic VTI:   0.25 m MR Vmean:        369.0 cm/s   Systemic Diam: 2.07 cm MR PISA:         1.01 cm MR PISA Eff ROA: 8 mm MR PISA Radius:  0.40 cm Adrian Prows MD Electronically signed by Adrian Prows MD Signature Date/Time: 07/03/2022/9:03:47 PM    Final    VAS [place] LOWER EXTREMITY VENOUS (DVT)  Result Date: 07/15/2022  Lower Venous DVT Study Patient Name:  Erika Fry  Date of Exam:   07/15/2022 Medical Rec #: [id number]           Accession #:    [id number] Date of Birth: January 29, 1946           Patient Gender: F Patient Age:   41 years Exam Location:  [facility] Procedure:      VAS [place] LOWER EXTREMITY VENOUS (DVT) Referring Phys: Jenny Reichmann PAYNE --------------------------------------------------------------------------------  Indications: Swelling.  Risk Factors: Limitations: Poor ultrasound/tissue interface and patient pain tolerance. Comparison Study: No prior studies. Performing Technologist: Oliver Hum RVT  Examination Guidelines: A complete evaluation includes B-mode imaging, spectral Doppler, color Doppler, and power Doppler as needed of all accessible portions of each vessel. Bilateral testing is considered an integral part of a complete examination. Limited examinations for reoccurring indications may be performed as noted. The reflux portion of the exam is performed with the patient in reverse Trendelenburg.  +---------+---------------+---------+-----------+----------+-------------------+ RIGHT    CompressibilityPhasicitySpontaneityPropertiesThrombus Aging      +---------+---------------+---------+-----------+----------+-------------------+ CFV      Full           Yes      Yes                                      +---------+---------------+---------+-----------+----------+-------------------+  SFJ      Full                                                             +---------+---------------+---------+-----------+----------+-------------------+ FV Prox  Full                                                              +---------+---------------+---------+-----------+----------+-------------------+ FV Mid   Full                                                             +---------+---------------+---------+-----------+----------+-------------------+ FV DistalFull                                                             +---------+---------------+---------+-----------+----------+-------------------+ PFV      Full                                                             +---------+---------------+---------+-----------+----------+-------------------+ POP      Full           Yes      Yes                                      +---------+---------------+---------+-----------+----------+-------------------+ PTV      Full                                                             +---------+---------------+---------+-----------+----------+-------------------+ PERO                                                  Patency shown with                                                        color doppler       +---------+---------------+---------+-----------+----------+-------------------+   +---------+---------------+---------+-----------+----------+--------------+ LEFT     CompressibilityPhasicitySpontaneityPropertiesThrombus Aging +---------+---------------+---------+-----------+----------+--------------+ CFV      Full  Yes      Yes                                 +---------+---------------+---------+-----------+----------+--------------+ SFJ      Full                                                        +---------+---------------+---------+-----------+----------+--------------+ FV Prox  Full                                                        +---------+---------------+---------+-----------+----------+--------------+ FV Mid   Full                                                         +---------+---------------+---------+-----------+----------+--------------+ FV DistalFull                                                        +---------+---------------+---------+-----------+----------+--------------+ PFV      Full                                                        +---------+---------------+---------+-----------+----------+--------------+ POP      Full           Yes      Yes                                 +---------+---------------+---------+-----------+----------+--------------+ PTV      Full                                                        +---------+---------------+---------+-----------+----------+--------------+ PERO     Full                                                        +---------+---------------+---------+-----------+----------+--------------+     Summary: RIGHT: - There is no evidence of deep vein thrombosis in the lower extremity. However, portions of this examination were limited- see technologist comments above.  - No cystic structure found in the popliteal fossa.  LEFT: - There is no evidence of deep vein thrombosis in the lower extremity. However, portions of this examination were limited- see technologist comments above.  - No cystic structure found  in the popliteal fossa.  *See table(s) above for measurements and observations. Electronically signed by Servando Snare MD on 07/15/2022 at 5:41:15 PM.    Final    ECHOCARDIOGRAM COMPLETE  Result Date: 07/15/2022    ECHOCARDIOGRAM REPORT   Patient Name:   Erika Fry Date of Exam: 07/15/2022 Medical Rec #:  [id number]          Height:       65.0 in Accession #:    [id number]         Weight:       176.8 lb Date of Birth:  1945/12/28          BSA:          1.877 m Patient Age:    70 years           BP:           93/54 mmHg Patient Gender: F                  HR:           60 bpm. Exam Location:  Inpatient Procedure: 2D Echo, Color Doppler, Cardiac Doppler and 3D Echo Indications:     Septic Shock  History:        Patient has prior history of Echocardiogram examinations, most                 recent 08/13/2021. CHF; Risk Factors:Diabetes and Dyslipidemia.  Sonographer:    Raquel Sarna Senior RDCS Referring Phys: [phone] [facility]LLISON IMPRESSIONS  1. Left ventricular ejection fraction, by estimation, is 60 to 65%. The left ventricle has normal function. The left ventricle has no regional wall motion abnormalities. There is severe asymmetric left ventricular hypertrophy of the basal-septal segment. Left ventricular diastolic parameters are consistent with Grade I diastolic dysfunction (impaired relaxation). Elevated left ventricular end-diastolic pressure.  2. There appears to be possible mobile vegeation or thrombus associated with the pacemaker lead. Right ventricular systolic function is low normal. The right ventricular size is normal.  3. Left atrial size was moderately dilated.  4. Large calcified mass on the posterior leafet. The mitral valve is abnormal. Trivial mitral valve regurgitation. Mild mitral stenosis. The mean mitral valve gradient is 5.0 mmHg with average heart rate of 60 bpm. Moderate mitral annular calcification.  5. The aortic valve is abnormal. There is moderate calcification of the aortic valve. There is moderate thickening of the aortic valve. Aortic valve regurgitation is mild to moderate. Moderate to severe aortic valve stenosis based on gradients, however,  more visually moderate - while there is severe subvalvular septal hypertrophy, the LVOT gradient is not significantly elevated at rest suggestive of HOCM- valsalva was not performed  6. Aortic valve area, by VTI measures 0.86 cm. Aortic valve mean gradient measures 43.5 mmHg. Aortic valve Vmax measures 4.20 m/s. DI is 0.27, consistent with moderate to severe AS.  7. Large mobile thrombus (or possible thrombotic vegetation) in the proximal aorta measuring 1.69 x .38 cm. There is Severe, mobile (Grade V) protruding plaque  involving the ascending aorta. Comparison(s): Changes from prior study are noted. 08/13/2021: LVEF >75%, severe biatrial enlargment, mild to moderate AS. Conclusion(s)/Recommendation(s): Findings concerning for thrombus/vegetation noted associated with the pacemaker lead, however, there is also a mobile mass in the proximal ascending aorta, would recommend Transesophageal Echocardiogram for clarification.  Critical findings reported to Dr. Carlis Abbott and acknowledged at 2:48 pm. FINDINGS  Left Ventricle: Left ventricular ejection fraction, by estimation, is 60 to  65%. The left ventricle has normal function. The left ventricle has no regional wall motion abnormalities. The left ventricular internal cavity size was normal in size. There is  severe asymmetric left ventricular hypertrophy of the basal-septal segment. Left ventricular diastolic parameters are consistent with Grade I diastolic dysfunction (impaired relaxation). Elevated left ventricular end-diastolic pressure. Right Ventricle: There appears to be possible mobile vegeation or thrombus associated with the pacemaker lead. The right ventricular size is normal. No increase in right ventricular wall thickness. Right ventricular systolic function is low normal. Left Atrium: Left atrial size was moderately dilated. Right Atrium: Right atrial size was not well visualized. Pericardium: There is no evidence of pericardial effusion. Mitral Valve: Large calcified mass on the posterior leafet. The mitral valve is abnormal. Moderate mitral annular calcification. Trivial mitral valve regurgitation. Mild mitral valve stenosis. MV peak gradient, 9.5 mmHg. The mean mitral valve gradient is  5.0 mmHg with average heart rate of 60 bpm. Tricuspid Valve: The tricuspid valve is grossly normal. Tricuspid valve regurgitation is mild. Aortic Valve: The aortic valve is abnormal. There is moderate calcification of the aortic valve. There is moderate thickening of the aortic valve. Aortic  valve regurgitation is mild to moderate. Moderate to severe aortic stenosis is present. Aortic valve  mean gradient measures 43.5 mmHg. Aortic valve peak gradient measures 70.6 mmHg. Aortic valve area, by VTI measures 0.86 cm. Pulmonic Valve: The pulmonic valve was not well visualized. Pulmonic valve regurgitation is not visualized. Aorta: Large mobile thrombus (or possible thrombotic vegetation) in the proximal aorta measuring 1.69 x .38 cm. The aortic root and ascending aorta are structurally normal, with no evidence of dilitation. There is severe, mobile (Grade V) protruding plaque involving the ascending aorta. Venous: The inferior vena cava was not well visualized. IAS/Shunts: The interatrial septum was not well visualized. Additional Comments: A device lead is visualized.  LEFT VENTRICLE PLAX 2D LVIDd:         4.20 cm   Diastology LVIDs:         2.80 cm   LV e' medial:    3.81 cm/s LV PW:         1.10 cm   LV E/e' medial:  39.9 LV IVS:        1.40 cm   LV e' lateral:   5.11 cm/s LVOT diam:     2.00 cm   LV E/e' lateral: 29.7 LV SV:         86 LV SV Index:   46 LVOT Area:     3.14 cm  RIGHT VENTRICLE RV S prime:     10.70 cm/s TAPSE (M-mode): 2.1 cm LEFT ATRIUM             Index        RIGHT ATRIUM           Index LA diam:        4.00 cm 2.13 cm/m   RA Area:     13.80 cm LA Vol (A2C):   98.2 ml 52.31 ml/m  RA Volume:   31.30 ml  16.67 ml/m LA Vol (A4C):   53.9 ml 28.71 ml/m LA Biplane Vol: 75.9 ml 40.43 ml/m  AORTIC VALVE AV Area (Vmax):    0.87 cm AV Area (Vmean):   0.83 cm AV Area (VTI):     0.86 cm AV Vmax:           420.00 cm/s AV Vmean:          310.500  cm/s AV VTI:            0.999 m AV Peak Grad:      70.6 mmHg AV Mean Grad:      43.5 mmHg LVOT Vmax:         116.00 cm/s LVOT Vmean:        82.200 cm/s LVOT VTI:          0.274 m LVOT/AV VTI ratio: 0.27  AORTA Ao Root diam: 3.10 cm MITRAL VALVE                TRICUSPID VALVE MV Area (PHT): 2.23 cm     TR Peak grad:   13.2 mmHg MV Area VTI:    1.53 cm     TR Vmax:        182.00 cm/s MV Peak grad:  9.5 mmHg MV Mean grad:  5.0 mmHg     SHUNTS MV Vmax:       1.54 m/s     Systemic VTI:  0.27 m MV Vmean:      105.0 cm/s   Systemic Diam: 2.00 cm MV Decel Time: 340 msec MV E velocity: 152.00 cm/s MV A velocity: 145.00 cm/s MV E/A ratio:  1.05 Lyman Bishop MD Electronically signed by Lyman Bishop MD Signature Date/Time: 07/15/2022/2:53:07 PM    Final     Labs: BMET Recent Labs  Lab 07/31/2022 1546 07/14/22 0014 07/14/22 1003 07/15/22 0705 07/06/2022 0732 07/17/22 0618  NA 131* 126* 126* 128* 129* 129*  K 4.2 5.3* 3.6 3.3* 2.9* 2.9*  CL 88* 88* 85* 92* 94* 92*  CO2 20* 19* 18* 16* 19* 21*  GLUCOSE 153* 129* 193* 154* 115* 116*  BUN 50* 53* 57* 48* 46* 47*  CREATININE 8.22* 7.79* 8.12* 7.18* 6.91* 7.18*  CALCIUM 11.0* 9.2 9.5 9.3 9.1 9.3  PHOS  --   --  8.2*  --   --   --     CBC Recent Labs  Lab 07/29/2022 1546 07/14/22 0014 07/15/22 0705 07/24/2022 0108 07/17/22 0618  WBC 27.5* 23.8* 16.1* 12.9* 11.6*  NEUTROABS 21.2*  --   --   --   --   HGB 15.7* 12.3 11.7* 10.9* 11.0*  HCT 45.9 36.3 35.7* 33.2* 32.7*  MCV 93.1 93.8 94.4 96.2 93.4  PLT 253 189 187 143* 170     Medications:     Chlorhexidine Gluconate Cloth  6 each Topical Daily   escitalopram  5 mg Oral Daily   ezetimibe  10 mg Oral Daily   ferric citrate  420 mg Oral TID WC   gentamicin cream  1 Application Topical Daily   heparin injection (subcutaneous)  5,000 Units Subcutaneous Q8H   insulin aspart  0-6 Units Subcutaneous TID WC   levothyroxine  75 mcg Oral QAC breakfast   midodrine  10 mg Oral TID with meals   pantoprazole  40 mg Oral Daily   polyethylene glycol  17 g Oral Daily   potassium chloride  20 mEq Oral Once   senna  1 tablet Oral QHS   simvastatin  40 mg Oral QHS   sodium bicarbonate  650 mg Oral BID

## 2022-07-17 NOTE — Progress Notes (Signed)
Pt receives out-pt PD care at [facility] home therapy unit. Spoke to staff at clinic to make them aware of pt's admission. Will advise clinic of d/c date once known.   Melven Sartorius Renal Navigator [phone]

## 2022-07-17 NOTE — Progress Notes (Signed)
PT Cancellation Note  Patient Details Name: Erika Fry MRN: [id number] DOB: Apr 17, 1946   Cancelled Treatment:    Reason Eval/Treat Not Completed: (P) Medical issues which prohibited therapy Pt reports nausea and vomiting all afternoon and would like PT to follow back tomorrow for treatment.  Giliana Vantil B. Migdalia Dk PT, DPT Acute Rehabilitation Services Please use secure chat or  Call Office [phone]    [facility] 07/17/2022, 3:05 PM

## 2022-07-17 NOTE — Progress Notes (Signed)
Pharmacy Antibiotic Note  Erika Fry is a 76 y.o. female admitted on 07/17/2022 with sepsis of unknown source.  Pharmacy has been consulted for cefepime dosing.  ESRD on PD at baseline. Plan to continue daily PD while inpatient. Peritoneal fluid without microbial growth. Clinically she is improving, WBC count down to 11.6, afebrile (98.1) though on APAP. 8/15 TEE without vegetations.   Today is day 5 of cefepime (first dose 8/12) and she received 1 loading dose of vancomycin on 8/12. Plan for 7 days of abx per Dr. Carlis Abbott.    Plan: Continue cefepime 1g IV Q24H for 2 more days (through 8/18) F/u clinical status    Height: '5\' 5"'$  (165.1 cm) Weight: 84 kg (185 lb 3 oz) IBW/kg (Calculated) : 57  Temp (24hrs), Avg:97.9 F (36.6 C), Min:97.7 F (36.5 C), Max:98.1 F (36.7 C)  Recent Labs  Lab 07/10/2022 1546 08/01/2022 1809 07/14/22 0014 07/14/22 0602 07/14/22 1003 07/15/22 0705 07/15/22 1037 07/05/2022 0108 07/19/2022 0732 07/17/22 0618  WBC 27.5*  --  23.8*  --   --  16.1*  --  12.9*  --  11.6*  CREATININE 8.22*  --  7.79*  --  8.12* 7.18*  --   --  6.91* 7.18*  LATICACIDVEN  --  2.7* 2.9* 1.6  --   --  1.4  --   --   --     Estimated Creatinine Clearance: 6.9 mL/min (A) (by C-G formula based on SCr of 7.18 mg/dL (H)).    Allergies  Allergen Reactions   Lioresal [Baclofen] Other (See Comments)    Somnolence "Put me into a coma and almost killed me"    Tape Other (See Comments)    Very sensitive skin, will tear easliy   Other Other (See Comments)    Patient has a high tolerance to antibiotics- has taken a lot of them during the course of her life   Codeine Other (See Comments)    Increased pain and couldn't sleep   Robitussin Cold Cough+ Chest [Dextromethorphan-Guaifenesin] Other (See Comments)    Unknown reaction    Antimicrobials this admission: 8/12 Vanc x1 8/12 Cefepime>>    Microbiology results: 8/13 peritoneal fluid - no growth/no organisms seen 8/12  blood cultures - negative     Eddie Candle, PharmD, BCPS, BCOP Clinical Pharmacist 07/17/2022 12:41 PM

## 2022-07-17 NOTE — Progress Notes (Signed)
PROGRESS NOTE    Erika Fry  WJX:[id number] DOB: 07-Jul-1946 DOA: 08/01/2022 PCP: [name], MD    Chief Complaint  Patient presents with   Weakness    Brief Narrative: HPI Patient is a 76 year old female with pertinent PMH of ESRD on peritoneal dialysis, DMT2, anemia, thrombocytopenia, HFpEF, pacemaker in place presents to [facility] ED on 8/12 with generalized weakness.   Patient states she has been noticing fatigue and generalized weakness for the past 2 to 3 days.  Has had decreased appetite and not drinking as much due to generalized abdominal tenderness.  She has been doing her peritoneal dialysis.  Denies fever, chest pain, N/V/D.  States she has stopped taking home midodrine for about a week. Came to [facility] ED on 8/12 for further work-up.   Upon arrival to [facility] ED, BP 82/54.  Other vitals stable and on room air.  Afebrile.  Patient given IV fluids.  Patient states she felt better after fluids but BP remained low.  CXR with no significant findings.  CT abdomen/pelvis with no acute abnormality.  WBC 27.5, Hgb 15.7, NA 131, creat 8.22, BUN 50, alk phos 178, AG 23, CO2 20.  COVID/flu negative.  LA 2.7.  BC x2 obtained and patient started on cefepime/Vanco.  PCCM consulted for ICU admission given hypotension.   Assessment & Plan:   Principal Problem:   Septic shock ([employer]) Active Problems:   ESRD on dialysis ([place])   Vaginal pain  #1 septic shock -Questionable etiology. -Patient noted on presentation to be in septic shock and had presented with fatigue, generalized weakness noted to be hypotensive in the ED with a significant leukocytosis white count of 27.5. -Blood cultures ordered with no growth to date. -Patient required pressors of Levophed which was subsequently weaned off currently on midodrine. -Peritoneal cultures with no growth. -Fever curve trended down, leukocytosis trending down. -Initial concern for endocarditis, TEE done 07/05/2022 negative for any  vegetations. -Was on IV Vanco, IV cefepime, IV vancomycin discontinued. -Can continue IV cefepime empirically to complete a 7-day course of treatment.  2.  Hypomagnesemia/hypokalemia/hyponatremia -Currently undergoing peritoneal dialysis per nephrology. -Potassium at 2.9, will give K 220 mEq x 1 due to CKD. -Magnesium at 1.8. -Electrolyte management per nephrology.  3.  Moderate AAS/mild to moderate AI/moderate MR -Cardiology following. -Was on heparin drip which has subsequently been discontinued. -Outpatient follow-up with cardiology.  4.  Vaginal pain -Continue current pain regimen. -PCCM started bedtime oral Valium at a low dose.  5.  Chronic anemia secondary to ESRD -Patient likely on presentation with hemoconcentration. -Hemoglobin currently stable at 11.0. -Per nephrology.  6.  ESRD on CCPD -Currently on nightly PD. -Per nephrology.  7.  Endocarditis ruled out -2D echo done on presentation with concern for thrombus on pacemaker lead and ascending aorta. -Patient seen in consultation by cardiology, underwent TEE which was negative for vegetations. -IV therapeutic heparin discontinued. -On IV antibiotics.  8.  Bilateral chronic venous stasis -Lower extremity Dopplers negative for DVT.  9.  Hypothyroidism -Continue home dose Synthroid.  DVT prophylaxis: Heparin Code Status: Full Family Communication: Updated patient.  No family at bedside. Disposition:   Status is: Inpatient Remains inpatient appropriate because: Severity of illness   Consultants:  Cardiology: Dr. Einar Gip 07/25/2022 Nephrology: Dr.Schertz 07/14/2022  Procedures:  TEE: 07/29/2022 per Dr. Einar Gip 2D echo 07/15/2022  Significant Hospital Events: Including procedures, antibiotic start and stop dates in addition to other pertinent events   8/12: admitted to [facility] w/ hypotension 8/14 - TTE showing  pacemaker lead and aortic vegetation/thrombus. Cardiology consulted  Antimicrobials:  IV cefepime  07/20/2022>>>> IV vancomycin 07/07/2022>>>> 07/14/2022   Subjective: Patient laying in bed.  No chest pain.  No shortness of breath.  No abdominal pain.  Complaining of her chronic vaginal pain.  Objective: Vitals:   07/17/22 1355 07/17/22 1415 07/17/22 1724 07/17/22 2041  BP: (!) 89/46  (!) 95/54 (!) 92/53  Pulse: 64  66 64  Resp: 12 15 16 18   Temp: 98.4 F (36.9 C)  99.5 F (37.5 C) 98.9 F (37.2 C)  TempSrc: Oral  Oral Oral  SpO2: 99%  99% 96%  Weight: 77.8 kg     Height:        Intake/Output Summary (Last 24 hours) at 07/17/2022 2112 Last data filed at 07/17/2022 1600 Gross per 24 hour  Intake 660.06 ml  Output 150 ml  Net 510.06 ml   Filed Weights   07/15/22 0800 07/17/22 0500 07/17/22 1355  Weight: 80.2 kg 84 kg 77.8 kg    Examination:  General exam: Appears calm and comfortable  Respiratory system: Clear to auscultation anterior lung fields. Respiratory effort normal. Cardiovascular system: RRR with 3/6 SEM.  No JVD, murmurs, rubs, gallops or clicks. No pedal edema.  Gastrointestinal system: Abdomen is nondistended, soft and nontender. No organomegaly or masses felt. Normal bowel sounds heard. Central nervous system: Alert and oriented. No focal neurological deficits. Extremities: Symmetric 5 x 5 power. Skin: No rashes, lesions or ulcers Psychiatry: Judgement and insight appear normal. Mood & affect appropriate.     Data Reviewed: I have personally reviewed following labs and imaging studies  CBC: Recent Labs  Lab 07/20/2022 1546 07/14/22 0014 07/15/22 0705 08/01/2022 0108 07/17/22 0618  WBC 27.5* 23.8* 16.1* 12.9* 11.6*  NEUTROABS 21.2*  --   --   --   --   HGB 15.7* 12.3 11.7* 10.9* 11.0*  HCT 45.9 36.3 35.7* 33.2* 32.7*  MCV 93.1 93.8 94.4 96.2 93.4  PLT 253 189 187 143* 673    Basic Metabolic Panel: Recent Labs  Lab 07/14/22 0014 07/14/22 1003 07/15/22 0705 07/26/2022 0732 07/17/22 0618  NA 126* 126* 128* 129* 129*  K 5.3* 3.6 3.3* 2.9* 2.9*   CL 88* 85* 92* 94* 92*  CO2 19* 18* 16* 19* 21*  GLUCOSE 129* 193* 154* 115* 116*  BUN 53* 57* 48* 46* 47*  CREATININE 7.79* 8.12* 7.18* 6.91* 7.18*  CALCIUM 9.2 9.5 9.3 9.1 9.3  MG 1.7  --  1.7 1.6* 1.8  PHOS  --  8.2*  --   --   --     GFR: Estimated Creatinine Clearance: 6.7 mL/min (A) (by C-G formula based on SCr of 7.18 mg/dL (H)).  Liver Function Tests: Recent Labs  Lab 08/01/2022 1546  AST 21  ALT 21  ALKPHOS 178*  BILITOT 0.6  PROT 7.4  ALBUMIN 3.1*    CBG: Recent Labs  Lab 07/29/2022 1734 07/04/2022 2159 07/17/22 0718 07/17/22 1121 07/17/22 1644  GLUCAP 90 111* 124* 174* 91     Recent Results (from the past 240 hour(s))  SARS Coronavirus 2 by RT PCR (hospital order, performed in [facility] hospital lab) *cepheid single result test* Anterior Nasal Swab     Status: None   Collection Time: 07/29/2022  6:07 PM   Specimen: Anterior Nasal Swab  Result Value Ref Range Status   SARS Coronavirus 2 by RT PCR NEGATIVE NEGATIVE Final    Comment: (NOTE) SARS-CoV-2 target nucleic acids are NOT DETECTED.  The SARS-CoV-2 RNA is generally detectable in upper and lower respiratory specimens during the acute phase of infection. The lowest concentration of SARS-CoV-2 viral copies this assay can detect is 250 copies / mL. A negative result does not preclude SARS-CoV-2 infection and should not be used as the sole basis for treatment or other patient management decisions.  A negative result may occur with improper specimen collection / handling, submission of specimen other than nasopharyngeal swab, presence of viral mutation(s) within the areas targeted by this assay, and inadequate number of viral copies (<250 copies / mL). A negative result must be combined with clinical observations, patient history, and epidemiological information.  Fact Sheet for Patients:   https://www.patel.info/  Fact Sheet for Healthcare  Providers: https://hall.com/  This test is not yet approved or  cleared by the [place] FDA and has been authorized for detection and/or diagnosis of SARS-CoV-2 by FDA under an Emergency Use Authorization (EUA).  This EUA will remain in effect (meaning this test can be used) for the duration of the COVID-19 declaration under Section 564(b)(1) of the Act, 21 U.S.C. section 360bbb-3(b)(1), unless the authorization is terminated or revoked sooner.  Performed at [facility], [place] [address], [place], [place] [address]   Blood culture (routine x 2)     Status: None (Preliminary result)   Collection Time: 07/31/2022  6:09 PM   Specimen: BLOOD LEFT FOREARM  Result Value Ref Range Status   Specimen Description BLOOD LEFT FOREARM  Final   Special Requests   Final    BOTTLES DRAWN AEROBIC AND ANAEROBIC Blood Culture results may not be optimal due to an inadequate volume of blood received in culture bottles   Culture   Final    NO GROWTH 4 DAYS Performed at [facility], [place] [address] [place], [place] [address]    Report Status PENDING  Incomplete  Blood culture (routine x 2)     Status: None (Preliminary result)   Collection Time: 07/15/2022  6:14 PM   Specimen: BLOOD  Result Value Ref Range Status   Specimen Description BLOOD LEFT ANTECUBITAL  Final   Special Requests   Final    BOTTLES DRAWN AEROBIC AND ANAEROBIC Blood Culture results may not be optimal due to an inadequate volume of blood received in culture bottles   Culture   Final    NO GROWTH 4 DAYS Performed at [facility], [place] [address], [place], [place] [address]    Report Status PENDING  Incomplete  Culture, body fluid w Gram Stain-bottle     Status: None (Preliminary result)   Collection Time: 07/08/2022 11:02 PM   Specimen: Fluid  Result Value Ref Range Status   Specimen Description FLUID PERITONEAL  Final   Special Requests NONE  Final   Culture   Final    NO GROWTH 3  DAYS Performed at [facility], [address] [place], [place] [address]    Report Status PENDING  Incomplete  MRSA Next Gen by PCR, Nasal     Status: None   Collection Time: 07/27/2022 11:52 PM   Specimen: Nasal Mucosa; Nasal Swab  Result Value Ref Range Status   MRSA by PCR Next Gen NOT DETECTED NOT DETECTED Final    Comment: (NOTE) The GeneXpert MRSA Assay (FDA approved for NASAL specimens only), is one component of a comprehensive MRSA colonization surveillance program. It is not intended to diagnose MRSA infection nor to guide or monitor treatment for MRSA  infections. Test performance is not FDA approved in patients less than 25 years old. Performed at [facility], [place] [address], [place], [place] [address]   Gram stain     Status: None   Collection Time: 07/14/22  5:02 AM   Specimen: Fluid  Result Value Ref Range Status   Specimen Description FLUID PERITONEAL  Final   Special Requests NONE  Final   Gram Stain   Final    NO ORGANISMS SEEN NO WBC SEEN Performed at [facility], [place] [address] [place], [place] [address]    Report Status 07/14/2022 FINAL  Final         Radiology Studies: ECHO TEE  Result Date: 07/12/2022    TRANSESOPHOGEAL ECHO REPORT   Patient Name:   Erika Fry Date of Exam: 07/28/2022 Medical Rec #:  [id number]          Height:       65.0 in Accession #:    [id number]         Weight:       176.8 lb Date of Birth:  Oct 16, 1946          BSA:          1.877 m Patient Age:    66 years           BP:           85/43 mmHg Patient Gender: F                  HR:           61 bpm. Exam Location:  Inpatient Procedure: 3D Echo, Transesophageal Echo, Cardiac Doppler and Color Doppler Indications:     I35.2 Nonrheumatic aortic (valve) stenosis with insufficiency;                  I35.8 Other nonrheumatic aortic valve disorders  History:         Patient has prior history of Echocardiogram examinations, most                  recent 07/15/2022.  Pacemaker, Aortic Valve Disease and Mitral                  Valve Disease, Signs/Symptoms:Bacteremia, Fever and Edema; Risk                  Factors:Diabetes. Shock. Aortic stenosis.  Sonographer:     Roseanna Rainbow RDCS Referring Phys:  Reynold Bowen PATWARDHAN Diagnosing Phys: Adrian Prows MD PROCEDURE: After discussion of the risks and benefits of a TEE, an informed consent was obtained from the patient. The transesophogeal probe was passed without difficulty through the esophogus of the patient. Imaged were obtained with the patient in a left lateral decubitus position. Sedation performed by different physician. The patient was monitored while under deep sedation. Anesthestetic sedation was provided intravenously by Anesthesiology: 174m of Propofol. The patient's vital signs; including heart rate, blood pressure, and oxygen saturation; remained stable throughout the procedure. The patient developed no complications during the procedure.  IMPRESSIONS  1. Left ventricular ejection fraction, by estimation, is 60 to 65%. The left ventricle has normal function. The left ventricle has no regional wall motion abnormalities.  2. Right ventricular systolic function is normal. The right ventricular size is normal.  3. Left atrial size was moderately dilated. No left atrial/left atrial appendage thrombus was detected.  4. Pacemaker lead noted.  5. The mass is separate from the  AV and appears to have similar echogenicity of the pacemaker wire? complex atheroma measuring 2.1x0.7 cm?[name] Kitchen The pacemaker leads appear to be intact. . There is mild dilatation of the ascending aorta, measuring 37 mm. There is Severe, mobile (Grade V) protruding plaque involving the ascending aorta.  6. The aortic valve is calcified. There is severe calcifcation of the aortic valve. There is moderate thickening of the aortic valve. Aortic valve regurgitation is moderate. Moderate aortic valve stenosis. Aortic valve area, by VTI measures 0.83 cm. Aortic valve mean  gradient measures 41.0 mmHg. Aortic valve Vmax measures 4.39 m/s.  7. The mitral valve is degenerative. Moderate mitral valve regurgitation. No evidence of mitral stenosis. Moderate mitral annular calcification.  8. Conclusion(s)/Recommendation(s): Procedure done in a relative short time due to complexity of the patient's physical condition. No TEE e/o Endocarditis. FINDINGS  Left Ventricle: Left ventricular ejection fraction, by estimation, is 60 to 65%. The left ventricle has normal function. The left ventricle has no regional wall motion abnormalities. The left ventricular internal cavity size was normal in size. There is  no left ventricular hypertrophy. Right Ventricle: The right ventricular size is normal. No increase in right ventricular wall thickness. Right ventricular systolic function is normal. Left Atrium: Left atrial size was moderately dilated. No left atrial/left atrial appendage thrombus was detected. Right Atrium: Right atrial size was normal in size. Pacemaker lead noted. Pericardium: There is no evidence of pericardial effusion. Mitral Valve: The mitral valve is degenerative in appearance. There is moderate thickening of the mitral valve leaflet(s). Mildly decreased mobility of the mitral valve leaflets. Moderate mitral annular calcification. Moderate mitral valve regurgitation.  No evidence of mitral valve stenosis. Tricuspid Valve: The tricuspid valve is normal in structure. Tricuspid valve regurgitation is trivial. Aortic Valve: The aortic valve is calcified. There is severe calcifcation of the aortic valve. There is moderate thickening of the aortic valve. There is mild aortic valve annular calcification. Aortic valve regurgitation is moderate. Aortic regurgitation PHT measures 527 msec. Moderate aortic stenosis is present. Aortic valve mean gradient measures 41.0 mmHg. Aortic valve peak gradient measures 77.1 mmHg. Aortic valve area, by VTI measures 0.83 cm. Pulmonic Valve: The pulmonic  valve was normal in structure. Pulmonic valve regurgitation is not visualized. No evidence of pulmonic stenosis. Aorta: The mass is separate from the AV and appears to have similar echogenicity of the pacemaker wire? complex atheroma measuring 2.1x0.7 cm?[name] Kitchen The pacemaker leads appear to be intact. The aortic root is normal in size and structure. There is mild dilatation of the ascending aorta, measuring 37 mm. There is severe, mobile (Grade V) protruding plaque involving the ascending aorta. Venous: The inferior vena cava was not well visualized. IAS/Shunts: No atrial level shunt detected by color flow Doppler. Additional Comments: A device lead is visualized.  LEFT VENTRICLE PLAX 2D LVOT diam:     2.07 cm LV SV:         85 LV SV Index:   45 LVOT Area:     3.35 cm  AORTIC VALVE AV Area (Vmax):    0.85 cm AV Area (Vmean):   0.87 cm AV Area (VTI):     0.83 cm AV Vmax:           439.00 cm/s AV Vmean:          293.333 cm/s AV VTI:            1.020 m AV Peak Grad:      77.1 mmHg AV Mean Grad:  41.0 mmHg LVOT Vmax:         111.00 cm/s LVOT Vmean:        75.850 cm/s LVOT VTI:          0.252 m LVOT/AV VTI ratio: 0.25 AI PHT:            527 msec MR Peak grad:    101.6 mmHg MR Mean grad:    64.0 mmHg    SHUNTS MR Vmax:         504.00 cm/s  Systemic VTI:  0.25 m MR Vmean:        369.0 cm/s   Systemic Diam: 2.07 cm MR PISA:         1.01 cm MR PISA Eff ROA: 8 mm MR PISA Radius:  0.40 cm Adrian Prows MD Electronically signed by Adrian Prows MD Signature Date/Time: 07/05/2022/9:03:47 PM    Final         Scheduled Meds:  Chlorhexidine Gluconate Cloth  6 each Topical Daily   escitalopram  5 mg Oral Daily   ezetimibe  10 mg Oral Daily   ferric citrate  420 mg Oral TID WC   gentamicin cream  1 Application Topical Daily   heparin injection (subcutaneous)  5,000 Units Subcutaneous Q8H   insulin aspart  0-6 Units Subcutaneous TID WC   levothyroxine  75 mcg Oral QAC breakfast   midodrine  10 mg Oral TID with meals    pantoprazole  40 mg Oral Daily   polyethylene glycol  17 g Oral Daily   senna  1 tablet Oral QHS   simvastatin  40 mg Oral QHS   sodium bicarbonate  650 mg Oral BID   Continuous Infusions:  sodium chloride Stopped (07/14/22 0109)   sodium chloride Stopped (07/15/22 2254)   ceFEPime (MAXIPIME) IV Stopped (07/03/2022 2213)   dialysis solution 1.5% low-MG/low-CA       LOS: 4 days    Time spent: 40 minutes    Irine Seal, MD [facility]   To contact the attending provider between 7A-7P or the covering provider during after hours 7P-7A, please log into the web site [email or web address] and access using universal [place] password for that web site. If you do not have the password, please call the hospital operator.  07/17/2022, 9:12 PM

## 2022-07-17 NOTE — TOC Initial Note (Addendum)
Transition of Care [facility]) - Initial/Assessment Note    Patient Details  Name: Erika Fry MRN: [id number] Date of Birth: 11-23-1946  Transition of [facility]) CM/SW Contact:    Tom-Johnson, Renea Ee, RN Phone Number: 07/17/2022, 3:44 PM  Clinical Narrative:                  CM spoke with patient at bedside about needs for post hospital transition. Admitted for Septic Shock, on IV abx.  From home alone, has three sons. Does not drive, has friends and family that transports to and from appointments. Has all necessary DME's at home.  Patient is on PD at home by [facility] on [address]. PCP is Avva, Steva Ready, MD and uses [facility] on [facility].  Home health referral called in to [facility] per patient's request as she had used their services before. CM spoke with Claiborne Billings and acceptance voiced. Info on AVS. CM will continue to follow with needs as patient progresses with care.    Expected Discharge Plan: [place] Barriers to Discharge: Continued Medical Work up   Patient Goals and CMS Choice Patient states their goals for this hospitalization and ongoing recovery are:: To return home CMS Medicare.gov Compare Post Acute Care list provided to:: Patient Choice offered to / list presented to : Patient  Expected Discharge Plan and Services Expected Discharge Plan: [place]   Discharge Planning Services: CM Consult Post Acute Care Choice: [place] arrangements for the past 2 months: Single Family Home                 DME Arranged: N/A DME Agency: NA       HH Arranged: PT   Date HH Agency Contacted: 07/17/22 Time HH Agency Contacted: [phone]    Prior Living Arrangements/Services Living arrangements for the past 2 months: Single Family Home Lives with:: Self Patient language and need for interpreter reviewed:: Yes Do you feel safe going back to the place where you live?: Yes      Need for Family Participation in  Patient Care: Yes (Comment) Care giver support system in place?: Yes (comment) Current home services: DME Criminal Activity/Legal Involvement Pertinent to Current Situation/Hospitalization: No - Comment as needed  Activities of Daily Living Home Assistive Devices/Equipment: Hospital bed ADL Screening (condition at time of admission) Patient's cognitive ability adequate to safely complete daily activities?: Yes Is the patient deaf or have difficulty hearing?: No Does the patient have difficulty seeing, even when wearing glasses/contacts?: No Does the patient have difficulty concentrating, remembering, or making decisions?: No Patient able to express need for assistance with ADLs?: Yes Does the patient have difficulty dressing or bathing?: Yes Independently performs ADLs?: Yes (appropriate for developmental age) Does the patient have difficulty walking or climbing stairs?: Yes Weakness of Legs: Both Weakness of Arms/Hands: Both  Permission Sought/Granted Permission sought to share information with : Case Manager, Family Supports, Chartered certified accountant granted to share information with : Yes, Verbal Permission Granted              Emotional Assessment Appearance:: Appears stated age Attitude/Demeanor/Rapport: Engaged, Gracious Affect (typically observed): Accepting, Appropriate, Calm, Hopeful, Pleasant Orientation: : Oriented to Self, Oriented to Place, Oriented to  Time, Oriented to Situation Alcohol / Substance Use: Not Applicable Psych Involvement: No (comment)  Admission diagnosis:  Weakness [R53.1] Septic shock (HCC) [A41.9, R65.21] Patient Active Problem List   Diagnosis Date Noted   High anion gap  metabolic acidosis    Pain of upper abdomen    Septic shock ([place]) 08/06/2021   Hypotension 08/06/2021   Changes in vision 06/21/2021   Temporal arteritis syndrome ([place]) 06/21/2021   Acidosis 10/06/2020   Disorder of lipoprotein metabolism, unspecified  10/06/2020   Liver disease, unspecified 10/06/2020   Moderate protein-calorie malnutrition ([place]) 10/06/2020   Other abnormal findings in urine 10/06/2020   Other dietary vitamin B12 deficiency anemia 10/06/2020   Other disorders of bilirubin metabolism 10/06/2020   Other disorders of electrolyte and fluid balance, not elsewhere classified 10/06/2020   Other disorders resulting from impaired renal tubular function 10/06/2020   Other long term (current) drug therapy 10/06/2020   Allergy, unspecified, initial encounter 08/21/2020   Anaphylactic shock, unspecified, initial encounter 08/21/2020   Nausea 05/18/2020   Acute respiratory disease due to COVID-19 virus 12/25/2019   Encounter for care of pacemaker 09/07/2019   Mild protein-calorie malnutrition ([place]) 07/01/2019   Coagulation defect, unspecified ([place]) 06/29/2019   Secondary hyperparathyroidism of renal origin ([place]) 06/29/2019   ESRD on peritoneal dialysis ([place])    Pain in both lower extremities    Hypomagnesemia 06/11/2019   DNR (do not resuscitate) discussion    Cystitis    Acute on chronic diastolic CHF (congestive heart failure) ([place]) 06/10/2019   Chronic diastolic CHF (congestive heart failure) ([place]) 06/09/2019   Cellulitis of lower extremity 06/09/2019   Bladder pain    Hyponatremia    Acute on chronic anemia    Diabetes mellitus type 2 in obese (HCC)    Chronic kidney disease    Drug induced constipation    Neurogenic bladder    Weakness generalized 04/21/2019   Acquired hydronephrosis with ureteropelvic junction (UPJ) obstruction    Pain in joint involving pelvic region and thigh    Dysuria    Palliative care by specialist    AKI (acute kidney injury) ([place]) 04/10/2019   Hydronephrosis 04/09/2019   Interstitial cystitis 04/09/2019   Bilateral leg edema 02/25/2019   Chronic heart failure with preserved ejection fraction ([place]) 02/24/2019   Pacemaker Medtronic Azure XT DR MRI Dual Chamber Pacemaker 03/25/2018  02/24/2019   ARF (acute renal failure) ([place]) 02/10/2019   Peripheral edema    Fever 01/17/2019   Acute lower UTI 01/17/2019   Pressure injury of skin 01/17/2019   Gross hematuria 01/14/2019   Fluid retention    Generalized edema    Hypokalemia    Acute right-sided low back pain with right-sided sciatica    Acute blood loss anemia    Anemia of chronic disease    Thrombocytopenia (HCC)    Diabetes mellitus (HCC)    Morbid obesity (HCC)    Hypoalbuminemia due to protein-calorie malnutrition (HCC)    Occult blood in stools    Gastric polyp    Lumbar discitis 05/17/2018   Blood loss anemia 05/12/2018   Hyperlipidemia associated with type 2 diabetes mellitus ([place]) 05/10/2018   Yeast UTI 04/30/2018   Chronic kidney disease (CKD), stage IV (severe) (HCC)    Diet-controlled diabetes mellitus (HCC)    GERD (gastroesophageal reflux disease)    Gout    High cholesterol    Hypothyroidism    Iron deficiency anemia    AV block, Mobitz 2 03/25/2018   Mobitz type 2 second degree AV block 03/20/2018   Exertional dyspnea 03/19/2018   Acute pain of left knee 07/16/2016   Right wrist pain 07/16/2016   PCP:  [name], MD Pharmacy:  No Pharmacies Listed  Social Determinants of Health (SDOH) Interventions    Readmission Risk Interventions    08/13/2021    2:56 PM 12/29/2019    9:29 AM  Readmission Risk Prevention Plan  Transportation Screening Complete Complete  Medication Review ([place]) Complete Complete  PCP or Specialist appointment within 3-5 days of discharge Complete   HRI or [place] Complete Complete  SW Recovery Care/Counseling Consult Complete   Palliative Care Screening Not Applicable Not [place] Patient Refused Not Applicable

## 2022-07-18 DIAGNOSIS — N186 End stage renal disease: Secondary | ICD-10-CM | POA: Diagnosis not present

## 2022-07-18 DIAGNOSIS — E876 Hypokalemia: Secondary | ICD-10-CM | POA: Diagnosis not present

## 2022-07-18 DIAGNOSIS — A419 Sepsis, unspecified organism: Secondary | ICD-10-CM | POA: Diagnosis not present

## 2022-07-18 LAB — CULTURE, BLOOD (ROUTINE X 2)
Culture: NO GROWTH
Culture: NO GROWTH

## 2022-07-18 LAB — CBC
HCT: 31.5 % — ABNORMAL LOW (ref 36.0–46.0)
Hemoglobin: 10.7 g/dL — ABNORMAL LOW (ref 12.0–15.0)
MCH: 31.8 pg (ref 26.0–34.0)
MCHC: 34 g/dL (ref 30.0–36.0)
MCV: 93.8 fL (ref 80.0–100.0)
Platelets: 151 10*3/uL (ref 150–400)
RBC: 3.36 MIL/uL — ABNORMAL LOW (ref 3.87–5.11)
RDW: 14 % (ref 11.5–15.5)
WBC: 12 10*3/uL — ABNORMAL HIGH (ref 4.0–10.5)
nRBC: 0 % (ref 0.0–0.2)

## 2022-07-18 LAB — BASIC METABOLIC PANEL
Anion gap: 11 (ref 5–15)
BUN: 39 mg/dL — ABNORMAL HIGH (ref 8–23)
CO2: 26 mmol/L (ref 22–32)
Calcium: 9.3 mg/dL (ref 8.9–10.3)
Chloride: 93 mmol/L — ABNORMAL LOW (ref 98–111)
Creatinine, Ser: 6.91 mg/dL — ABNORMAL HIGH (ref 0.44–1.00)
GFR, Estimated: 6 mL/min — ABNORMAL LOW (ref >60)
Glucose, Bld: 120 mg/dL — ABNORMAL HIGH (ref 70–99)
Potassium: 3 mmol/L — ABNORMAL LOW (ref 3.5–5.1)
Sodium: 130 mmol/L — ABNORMAL LOW (ref 135–145)

## 2022-07-18 LAB — MAGNESIUM: Magnesium: 2.2 mg/dL (ref 1.7–2.4)

## 2022-07-18 LAB — GLUCOSE, CAPILLARY
Glucose-Capillary: 148 mg/dL — ABNORMAL HIGH (ref 70–99)
Glucose-Capillary: 151 mg/dL — ABNORMAL HIGH (ref 70–99)
Glucose-Capillary: 109 mg/dL — ABNORMAL HIGH (ref 70–99)
Glucose-Capillary: 128 mg/dL — ABNORMAL HIGH (ref 70–99)

## 2022-07-18 MED ORDER — POTASSIUM CHLORIDE CRYS ER 20 MEQ PO TBCR
40.0000 meq | EXTENDED_RELEASE_TABLET | Freq: Once | ORAL | Status: AC
  Administered 2022-07-18: 40 meq via ORAL
  Filled 2022-07-18: qty 2

## 2022-07-18 MED ORDER — DIAZEPAM 2 MG PO TABS
1.0000 mg | ORAL_TABLET | Freq: Once | ORAL | Status: AC
  Administered 2022-07-18: 1 mg via ORAL
  Filled 2022-07-18: qty 1

## 2022-07-18 NOTE — Progress Notes (Signed)
Erika Fry Progress Note    Assessment/ Plan:   ESRD on CCPD -will c/w nightly PD, will continue to utilize all 1.5% bags -caution w/ fluids -backup access: RUE AVF  Hypotension -acute on chronic. Possible sepsis, source unclear-abx per primary -pressor support per primary service, currently off -on midodrine as well -PD cell count reassuring-not indicating infection -EF 60-65% on TEE   Possible vegetation on pacer lead and aorta -TEE 8/15 complex plaque to ascending aortic wall. No valvular vegetation   Hypovolemia -on isotonic fluids  Hyperkalemia -resolved. Now low, can replete PRN -KCl ordered   Hyponatremia -improved to 129   Anemia of CKD -hgb acceptable/at goal. Not on ESAs as outpatient. Avoiding IV Fe for now given possible endocarditis  CKD MBD -c/w auryxia -renal diet  B/L chronic venous stasis -per primary   OP PD Orders: Follows at [facility]. 7d/ wk, 75kg, 6 exchanges overnight, 2800cc , 1.5h dwell, no daybag - last Hb 12.9 on 8/2, last mircera 6/26 - last pth 1027, phos 9 - no esa or vdra  Lynnda Child PA-C [facility] 07/18/2022,10:24 AM   Subjective:   Slow drain alarm on cycler this am. Denies abd pain, n/v this am. Uncomfortable d/t ongoing vaginal pain. Labs pending    Objective:   BP (!) 88/53 (BP Location: Left Leg)   Pulse 74   Temp 98.3 F (36.8 C)   Resp 17   Ht '5\' 5"'$  (1.651 m)   Wt 80.1 kg   SpO2 97%   BMI 29.39 kg/m   Intake/Output Summary (Last 24 hours) at 07/18/2022 1024 Last data filed at 07/18/2022 0800 Gross per 24 hour  Intake 890.95 ml  Output 150 ml  Net 740.95 ml    Weight change: -6.2 kg  Physical Exam: Gen:nad CVS:RRR Resp:CTA BL Abd:PD site c/d/I, soft, nontender, nondistended JSE:GBTDV pedal edema, sensitive to touch/tender to touch mostly in b/l ankles Neuro: awake, alert Dialysis access: PD cath, RUE AVF +b/t  Imaging: ECHO TEE  Result Date: 07/24/2022     TRANSESOPHOGEAL ECHO REPORT   Patient Name:   Erika Fry Date of Exam: 07/21/2022 Medical Rec #:  [id number]          Height:       65.0 in Accession #:    [id number]         Weight:       176.8 lb Date of Birth:  11/02/1946          BSA:          1.877 m Patient Age:    76 years           BP:           85/43 mmHg Patient Gender: F                  HR:           61 bpm. Exam Location:  Inpatient Procedure: 3D Echo, Transesophageal Echo, Cardiac Doppler and Color Doppler Indications:     I35.2 Nonrheumatic aortic (valve) stenosis with insufficiency;                  I35.8 Other nonrheumatic aortic valve disorders  History:         Patient has prior history of Echocardiogram examinations, most                  recent 07/15/2022. Pacemaker, Aortic Valve Disease and Mitral  Valve Disease, Signs/Symptoms:Bacteremia, Fever and Edema; Risk                  Factors:Diabetes. Shock. Aortic stenosis.  Sonographer:     Roseanna Rainbow RDCS Referring Phys:  Reynold Bowen PATWARDHAN Diagnosing Phys: Adrian Prows MD PROCEDURE: After discussion of the risks and benefits of a TEE, an informed consent was obtained from the patient. The transesophogeal probe was passed without difficulty through the esophogus of the patient. Imaged were obtained with the patient in a left lateral decubitus position. Sedation performed by different physician. The patient was monitored while under deep sedation. Anesthestetic sedation was provided intravenously by Anesthesiology: '182mg'$  of Propofol. The patient's vital signs; including heart rate, blood pressure, and oxygen saturation; remained stable throughout the procedure. The patient developed no complications during the procedure.  IMPRESSIONS  1. Left ventricular ejection fraction, by estimation, is 60 to 65%. The left ventricle has normal function. The left ventricle has no regional wall motion abnormalities.  2. Right ventricular systolic function is normal. The right ventricular size is  normal.  3. Left atrial size was moderately dilated. No left atrial/left atrial appendage thrombus was detected.  4. Pacemaker lead noted.  5. The mass is separate from the AV and appears to have similar echogenicity of the pacemaker wire? complex atheroma measuring 2.1x0.7 cm?[name] Kitchen The pacemaker leads appear to be intact. . There is mild dilatation of the ascending aorta, measuring 37 mm. There is Severe, mobile (Grade V) protruding plaque involving the ascending aorta.  6. The aortic valve is calcified. There is severe calcifcation of the aortic valve. There is moderate thickening of the aortic valve. Aortic valve regurgitation is moderate. Moderate aortic valve stenosis. Aortic valve area, by VTI measures 0.83 cm. Aortic valve mean gradient measures 41.0 mmHg. Aortic valve Vmax measures 4.39 m/s.  7. The mitral valve is degenerative. Moderate mitral valve regurgitation. No evidence of mitral stenosis. Moderate mitral annular calcification.  8. Conclusion(s)/Recommendation(s): Procedure done in a relative short time due to complexity of the patient's physical condition. No TEE e/o Endocarditis. FINDINGS  Left Ventricle: Left ventricular ejection fraction, by estimation, is 60 to 65%. The left ventricle has normal function. The left ventricle has no regional wall motion abnormalities. The left ventricular internal cavity size was normal in size. There is  no left ventricular hypertrophy. Right Ventricle: The right ventricular size is normal. No increase in right ventricular wall thickness. Right ventricular systolic function is normal. Left Atrium: Left atrial size was moderately dilated. No left atrial/left atrial appendage thrombus was detected. Right Atrium: Right atrial size was normal in size. Pacemaker lead noted. Pericardium: There is no evidence of pericardial effusion. Mitral Valve: The mitral valve is degenerative in appearance. There is moderate thickening of the mitral valve leaflet(s). Mildly decreased  mobility of the mitral valve leaflets. Moderate mitral annular calcification. Moderate mitral valve regurgitation.  No evidence of mitral valve stenosis. Tricuspid Valve: The tricuspid valve is normal in structure. Tricuspid valve regurgitation is trivial. Aortic Valve: The aortic valve is calcified. There is severe calcifcation of the aortic valve. There is moderate thickening of the aortic valve. There is mild aortic valve annular calcification. Aortic valve regurgitation is moderate. Aortic regurgitation PHT measures 527 msec. Moderate aortic stenosis is present. Aortic valve mean gradient measures 41.0 mmHg. Aortic valve peak gradient measures 77.1 mmHg. Aortic valve area, by VTI measures 0.83 cm. Pulmonic Valve: The pulmonic valve was normal in structure. Pulmonic valve regurgitation is not visualized. No evidence of  pulmonic stenosis. Aorta: The mass is separate from the AV and appears to have similar echogenicity of the pacemaker wire? complex atheroma measuring 2.1x0.7 cm?[name] Kitchen The pacemaker leads appear to be intact. The aortic root is normal in size and structure. There is mild dilatation of the ascending aorta, measuring 37 mm. There is severe, mobile (Grade V) protruding plaque involving the ascending aorta. Venous: The inferior vena cava was not well visualized. IAS/Shunts: No atrial level shunt detected by color flow Doppler. Additional Comments: A device lead is visualized.  LEFT VENTRICLE PLAX 2D LVOT diam:     2.07 cm LV SV:         85 LV SV Index:   45 LVOT Area:     3.35 cm  AORTIC VALVE AV Area (Vmax):    0.85 cm AV Area (Vmean):   0.87 cm AV Area (VTI):     0.83 cm AV Vmax:           439.00 cm/s AV Vmean:          293.333 cm/s AV VTI:            1.020 m AV Peak Grad:      77.1 mmHg AV Mean Grad:      41.0 mmHg LVOT Vmax:         111.00 cm/s LVOT Vmean:        75.850 cm/s LVOT VTI:          0.252 m LVOT/AV VTI ratio: 0.25 AI PHT:            527 msec MR Peak grad:    101.6 mmHg MR Mean grad:     64.0 mmHg    SHUNTS MR Vmax:         504.00 cm/s  Systemic VTI:  0.25 m MR Vmean:        369.0 cm/s   Systemic Diam: 2.07 cm MR PISA:         1.01 cm MR PISA Eff ROA: 8 mm MR PISA Radius:  0.40 cm Adrian Prows MD Electronically signed by Adrian Prows MD Signature Date/Time: 07/28/2022/9:03:47 PM    Final     Labs: BMET Recent Labs  Lab 07/07/2022 1546 07/14/22 0014 07/14/22 1003 07/15/22 0705 07/02/2022 0732 07/17/22 0618  NA 131* 126* 126* 128* 129* 129*  K 4.2 5.3* 3.6 3.3* 2.9* 2.9*  CL 88* 88* 85* 92* 94* 92*  CO2 20* 19* 18* 16* 19* 21*  GLUCOSE 153* 129* 193* 154* 115* 116*  BUN 50* 53* 57* 48* 46* 47*  CREATININE 8.22* 7.79* 8.12* 7.18* 6.91* 7.18*  CALCIUM 11.0* 9.2 9.5 9.3 9.1 9.3  PHOS  --   --  8.2*  --   --   --     CBC Recent Labs  Lab 07/31/2022 1546 07/14/22 0014 07/15/22 0705 07/07/2022 0108 07/17/22 0618  WBC 27.5* 23.8* 16.1* 12.9* 11.6*  NEUTROABS 21.2*  --   --   --   --   HGB 15.7* 12.3 11.7* 10.9* 11.0*  HCT 45.9 36.3 35.7* 33.2* 32.7*  MCV 93.1 93.8 94.4 96.2 93.4  PLT 253 189 187 143* 170     Medications:     Chlorhexidine Gluconate Cloth  6 each Topical Daily   escitalopram  5 mg Oral Daily   ezetimibe  10 mg Oral Daily   ferric citrate  420 mg Oral TID WC   gentamicin cream  1 Application Topical Daily   heparin injection (subcutaneous)  5,000  Units Subcutaneous Q8H   insulin aspart  0-6 Units Subcutaneous TID WC   levothyroxine  75 mcg Oral QAC breakfast   midodrine  10 mg Oral TID with meals   pantoprazole  40 mg Oral Daily   polyethylene glycol  17 g Oral Daily   senna  1 tablet Oral QHS   simvastatin  40 mg Oral QHS   sodium bicarbonate  650 mg Oral BID

## 2022-07-18 NOTE — Progress Notes (Signed)
PD tx initation note:   BP 92/53 mmHg HR 64bpm RR 18brpm Sats 96% RA  Pre TX weight: 79.8kg  PD treatment initiated via aseptic technique. Consent signed and in chart. Patient is alert and oriented. No complaints of pain. No specimen collected. PD exit site clean, dry and intact. Gentamycin and new dressing applied. Bedside RN educated on PD machine and how to contact tech support when PD machine alarms.

## 2022-07-18 NOTE — Progress Notes (Signed)
Physical Therapy Treatment Patient Details Name: Erika Fry MRN: [id number] DOB: 1946-08-22 Today's Date: 07/18/2022   History of Present Illness Patient is a 76 year old female presents to [facility] ED on 8/12 with generalized weakness.  Pt with septic shock, hypotension and questionable vegetation on pacer lead in aorta.  Pt for TEE 8/15.  PMH of ESRD on peritoneal dialysis, DMT2, anemia, thrombocytopenia, HFpEF, pacemaker    PT Comments    Patient very weak and only able to stand and take steps along side of bed. She reports at home she only walks a few steps and then has to sit on the seat of her rollator to rest. Reports she lives alone and is adamant that she will not go to SNF. Unclear if she will have the help necessary to safely discharge home.  Will need incr time on acute to achieve safe level of mobility to discharge home.    Recommendations for follow up therapy are one component of a multi-disciplinary discharge planning process, led by the attending physician.  Recommendations may be updated based on patient status, additional functional criteria and insurance authorization.  Follow Up Recommendations  Home health PT (pt adamant that she will go home and die before she ever goes to SNF again)     Assistance Recommended at Discharge Intermittent Supervision/Assistance  Patient can return home with the following A little help with walking and/or transfers;Help with stairs or ramp for entrance;Assist for transportation;Assistance with cooking/housework;A little help with bathing/dressing/bathroom   Equipment Recommendations  None recommended by PT    Recommendations for Other Services       Precautions / Restrictions Precautions Precautions: Fall Restrictions Weight Bearing Restrictions: No     Mobility  Bed Mobility Overal bed mobility: Needs Assistance Bed Mobility: Rolling, Sidelying to Sit, Sit to Sidelying Rolling: Mod assist Sidelying to sit: Mod assist, +2  for physical assistance, HOB elevated     Sit to sidelying: Max assist, +2 for physical assistance General bed mobility comments: vc for all steps of mobility; assist for moving legs and trunk in both side<>sit    Transfers Overall transfer level: Needs assistance Equipment used: Rolling walker (2 wheels) Transfers: Sit to/from Stand Sit to Stand: Min assist, +2 safety/equipment, From elevated surface           General transfer comment: repeated x 2 with bed elevated ~3"; no recliner in room to transfer to    Ambulation/Gait             Pre-gait activities: side-stepping toward HOB with RW and +2 min assist     Stairs             Wheelchair Mobility    Modified Rankin (Stroke Patients Only)       Balance Overall balance assessment: Needs assistance Sitting-balance support: No upper extremity supported, Feet supported Sitting balance-Leahy Scale: Fair     Standing balance support: Bilateral upper extremity supported, Reliant on assistive device for balance Standing balance-Leahy Scale: Poor                              Cognition Arousal/Alertness: Awake/alert Behavior During Therapy: Flat affect Overall Cognitive Status: Within Functional Limits for tasks assessed                                 General Comments: repeatedly stating "I can't believe how weak I  am"        Exercises      General Comments General comments (skin integrity, edema, etc.): Throughout session pt focusing on how weak she is, despite the fact that she moved fairly well.      Pertinent Vitals/Pain Pain Assessment Pain Assessment: No/denies pain Body Language: relaxed    Home Living                          Prior Function            PT Goals (current goals can now be found in the care plan section) Acute Rehab PT Goals Patient Stated Goal: to go home PT Goal Formulation: With patient Time For Goal Achievement:  Aug 26, 2022 Potential to Achieve Goals: Fair Progress towards PT goals: Progressing toward goals    Frequency    Min 3X/week      PT Plan Current plan remains appropriate    Co-evaluation              AM-PAC PT "6 Clicks" Mobility   Outcome Measure  Help needed turning from your back to your side while in a flat bed without using bedrails?: None Help needed moving from lying on your back to sitting on the side of a flat bed without using bedrails?: None Help needed moving to and from a bed to a chair (including a wheelchair)?: Total Help needed standing up from a chair using your arms (e.g., wheelchair or bedside chair)?: Total Help needed to walk in hospital room?: Total Help needed climbing 3-5 steps with a railing? : Total 6 Click Score: 12    End of Session Equipment Utilized During Treatment: Gait belt Activity Tolerance: Patient limited by fatigue Patient left: in bed;with call bell/phone within reach;with bed alarm set Nurse Communication: Mobility status PT Visit Diagnosis: Muscle weakness (generalized) (M62.81)     Time: 5053-9767 PT Time Calculation (min) (ACUTE ONLY): 22 min  Charges:  $Gait Training: 8-22 mins                      [place]  Office [phone]    Rexanne Mano 07/18/2022, 3:49 PM

## 2022-07-18 NOTE — Care Management Important Message (Signed)
Important Message  Patient Details  Name: Erika Fry MRN: [id number] Date of Birth: [birth year]   Medicare Important Message Given:  Yes     Areli Jowett Montine Circle 07/18/2022, 3:10 PM

## 2022-07-18 NOTE — Plan of Care (Signed)
  Problem: Clinical Measurements: Goal: Respiratory complications will improve Outcome: Progressing Goal: Cardiovascular complication will be avoided Outcome: Progressing   Problem: Nutrition: Goal: Adequate nutrition will be maintained Outcome: Progressing   Problem: Activity: Goal: Risk for activity intolerance will decrease Outcome: Not Progressing   Problem: Coping: Goal: Level of anxiety will decrease Outcome: Not Progressing

## 2022-07-18 NOTE — Progress Notes (Addendum)
PROGRESS NOTE    Erika Fry  HUD:[id number] DOB: May 26, 1946 DOA: 07/03/2022 PCP: [name], MD    Chief Complaint  Patient presents with   Weakness    Brief Narrative: HPI Patient is a 76 year old female with pertinent PMH of ESRD on peritoneal dialysis, DMT2, anemia, thrombocytopenia, HFpEF, pacemaker in place presents to [facility] ED on 8/12 with generalized weakness.   Patient states she has been noticing fatigue and generalized weakness for the past 2 to 3 days.  Has had decreased appetite and not drinking as much due to generalized abdominal tenderness.  She has been doing her peritoneal dialysis.  Denies fever, chest pain, N/V/D.  States she has stopped taking home midodrine for about a week. Came to [facility] ED on 8/12 for further work-up.   Upon arrival to [facility] ED, BP 82/54.  Other vitals stable and on room air.  Afebrile.  Patient given IV fluids.  Patient states she felt better after fluids but BP remained low.  CXR with no significant findings.  CT abdomen/pelvis with no acute abnormality.  WBC 27.5, Hgb 15.7, NA 131, creat 8.22, BUN 50, alk phos 178, AG 23, CO2 20.  COVID/flu negative.  LA 2.7.  BC x2 obtained and patient started on cefepime/Vanco.  PCCM consulted for ICU admission given hypotension.   Assessment & Plan:   Principal Problem:   Septic shock ([place]) Active Problems:   ESRD on dialysis ([place])   Vaginal pain  #1 septic shock -Questionable etiology. -Patient noted on presentation to be in septic shock and had presented with fatigue, generalized weakness noted to be hypotensive in the ED with a significant leukocytosis white count of 27.5. -Blood cultures ordered with no growth to date. -Patient required pressors of Levophed which was subsequently weaned off currently on midodrine. -Peritoneal cultures with no growth. -Fever curve trended down, leukocytosis trending down. -Initial concern for endocarditis, TEE done 07/04/2022 negative for any  vegetations. -Was on IV Vanco, IV cefepime, IV vancomycin discontinued. -Continue IV cefepime empirically to complete a 7-day course of treatment.  2.  Hypomagnesemia/hypokalemia/hyponatremia -Currently undergoing peritoneal dialysis per nephrology. -Potassium at 3.0.  Kdur 40 mEq p.o. x1.   -Magnesium noted at 1.8.   -Repeat labs in the AM.   -Electrolyte management per nephrology.  3.  Moderate AAS/mild to moderate AI/moderate MR -Cardiology following. -Was on heparin drip which has subsequently been discontinued. -Outpatient follow-up with cardiology.  4.  Vaginal pain -Continue current pain regimen. -PCCM started bedtime oral Valium as needed at a low dose. -Trial of Valium 1 mg p.o. x1 today as patient with complaints of ongoing chronic vaginal pain.  5.  Chronic anemia secondary to ESRD -Patient likely on presentation with hemoconcentration. -Hemoglobin currently stable at 10.7. -Per nephrology.  6.  ESRD on CCPD -Currently on nightly PD. -Per nephrology.  7.  Endocarditis ruled out -2D echo done on presentation with concern for thrombus on pacemaker lead and ascending aorta. -Patient seen in consultation by cardiology, underwent TEE which was negative for vegetations. -IV therapeutic heparin discontinued. -Continue IV antibiotics.    8.  Bilateral chronic venous stasis -Lower extremity Dopplers negative for DVT.  9.  Hypothyroidism -Synthroid.   -Outpatient follow-up with PCP.   DVT prophylaxis: Heparin Code Status: Full Family Communication: Updated patient.  No family at bedside. Disposition:   Status is: Inpatient Remains inpatient appropriate because: Severity of illness   Consultants:  Cardiology: Dr. Einar Gip 07/29/2022 Nephrology: Dr.Schertz 07/14/2022  Procedures:  TEE: 07/21/2022 per Dr. Einar Gip  2D echo 07/15/2022  Significant Hospital Events: Including procedures, antibiotic start and stop dates in addition to other pertinent events   8/12:  admitted to [facility] w/ hypotension 8/14 - TTE showing pacemaker lead and aortic vegetation/thrombus. Cardiology consulted  Antimicrobials:  IV cefepime 07/05/2022>>>> IV vancomycin 07/05/2022>>>> 07/14/2022   Subjective: Laying in bed.  No chest pain.  No shortness of breath.  No abdominal pain.  Complaining of chronic vaginal pain.    Objective: Vitals:   07/18/22 0507 07/18/22 0539 07/18/22 0604 07/18/22 0939  BP: (!) 83/33  (!) 94/47 (!) 88/53  Pulse: 78  (!) 58 74  Resp: 18   17  Temp: 98 F (36.7 C)   98.3 F (36.8 C)  TempSrc: Oral     SpO2: 99%   97%  Weight:  80.1 kg    Height:        Intake/Output Summary (Last 24 hours) at 07/18/2022 1232 Last data filed at 07/18/2022 0800 Gross per 24 hour  Intake 770.95 ml  Output 150 ml  Net 620.95 ml    Filed Weights   07/17/22 0500 07/17/22 1355 07/18/22 0539  Weight: 84 kg 77.8 kg 80.1 kg    Examination:  General exam: NAD Respiratory system: Lungs clear to auscultation bilaterally anterior lung fields.  No wheezes, no crackles, no rhonchi.  Fair air movement.  Speaking in full sentences.   Cardiovascular system: RRR with 3/6 SEM.  No JVD, murmurs, rubs, gallops or clicks. No pedal edema.  Gastrointestinal system: Abdomen is nondistended, soft and nontender. No organomegaly or masses felt. Normal bowel sounds heard. Central nervous system: Alert and oriented. No focal neurological deficits. Extremities: Symmetric 5 x 5 power. Skin: No rashes, lesions or ulcers Psychiatry: Judgement and insight appear normal. Mood & affect appropriate.     Data Reviewed: I have personally reviewed following labs and imaging studies  CBC: Recent Labs  Lab 07/27/2022 1546 07/14/22 0014 07/15/22 0705 07/31/2022 0108 07/17/22 0618  WBC 27.5* 23.8* 16.1* 12.9* 11.6*  NEUTROABS 21.2*  --   --   --   --   HGB 15.7* 12.3 11.7* 10.9* 11.0*  HCT 45.9 36.3 35.7* 33.2* 32.7*  MCV 93.1 93.8 94.4 96.2 93.4  PLT 253 189 187 143* 170     Basic  Metabolic Panel: Recent Labs  Lab 07/14/22 0014 07/14/22 1003 07/15/22 0705 07/10/2022 0732 07/17/22 0618  NA 126* 126* 128* 129* 129*  K 5.3* 3.6 3.3* 2.9* 2.9*  CL 88* 85* 92* 94* 92*  CO2 19* 18* 16* 19* 21*  GLUCOSE 129* 193* 154* 115* 116*  BUN 53* 57* 48* 46* 47*  CREATININE 7.79* 8.12* 7.18* 6.91* 7.18*  CALCIUM 9.2 9.5 9.3 9.1 9.3  MG 1.7  --  1.7 1.6* 1.8  PHOS  --  8.2*  --   --   --      GFR: Estimated Creatinine Clearance: 6.7 mL/min (A) (by C-G formula based on SCr of 7.18 mg/dL (H)).  Liver Function Tests: Recent Labs  Lab 07/04/2022 1546  AST 21  ALT 21  ALKPHOS 178*  BILITOT 0.6  PROT 7.4  ALBUMIN 3.1*     CBG: Recent Labs  Lab 07/17/22 0718 07/17/22 1121 07/17/22 1644 07/18/22 0755 07/18/22 1202  GLUCAP 124* 174* 91 148* 151*      Recent Results (from the past 240 hour(s))  SARS Coronavirus 2 by RT PCR (hospital order, performed in [facility] hospital lab) *cepheid single result test* Anterior Nasal Swab  Status: None   Collection Time: 08/01/2022  6:07 PM   Specimen: Anterior Nasal Swab  Result Value Ref Range Status   SARS Coronavirus 2 by RT PCR NEGATIVE NEGATIVE Final    Comment: (NOTE) SARS-CoV-2 target nucleic acids are NOT DETECTED.  The SARS-CoV-2 RNA is generally detectable in upper and lower respiratory specimens during the acute phase of infection. The lowest concentration of SARS-CoV-2 viral copies this assay can detect is 250 copies / mL. A negative result does not preclude SARS-CoV-2 infection and should not be used as the sole basis for treatment or other patient management decisions.  A negative result may occur with improper specimen collection / handling, submission of specimen other than nasopharyngeal swab, presence of viral mutation(s) within the areas targeted by this assay, and inadequate number of viral copies (<250 copies / mL). A negative result must be combined with clinical observations, patient history,  and epidemiological information.  Fact Sheet for Patients:   https://www.fda.gov/media/158405/download  Fact Sheet for Healthcare Providers: https://www.fda.gov/media/158404/download  This test is not yet approved or  cleared by the [place] FDA and has been authorized for detection and/or diagnosis of SARS-CoV-2 by FDA under an Emergency Use Authorization (EUA).  This EUA will remain in effect (meaning this test can be used) for the duration of the COVID-19 declaration under Section 564(b)(1) of the Act, 21 U.S.C. section 360bbb-3(b)(1), unless the authorization is terminated or revoked sooner.  Performed at [facility], [address] [place], [place] [address]   Blood culture (routine x 2)     Status: None   Collection Time: 07/24/2022  6:09 PM   Specimen: BLOOD LEFT FOREARM  Result Value Ref Range Status   Specimen Description BLOOD LEFT FOREARM  Final   Special Requests   Final    BOTTLES DRAWN AEROBIC AND ANAEROBIC Blood Culture results may not be optimal due to an inadequate volume of blood received in culture bottles   Culture   Final    NO GROWTH 5 DAYS Performed at [facility], [address] [place], [place] [address]    Report Status 07/18/2022 FINAL  Final  Blood culture (routine x 2)     Status: None   Collection Time: 07/29/2022  6:14 PM   Specimen: BLOOD  Result Value Ref Range Status   Specimen Description BLOOD LEFT ANTECUBITAL  Final   Special Requests   Final    BOTTLES DRAWN AEROBIC AND ANAEROBIC Blood Culture results may not be optimal due to an inadequate volume of blood received in culture bottles   Culture   Final    NO GROWTH 5 DAYS Performed at [facility], [address] [place], [place] [address]    Report Status 07/18/2022 FINAL  Final  Culture, body fluid w Gram Stain-bottle     Status: None (Preliminary result)   Collection Time: 07/28/2022 11:02 PM   Specimen: Fluid  Result Value Ref Range Status   Specimen  Description FLUID PERITONEAL  Final   Special Requests NONE  Final   Culture   Final    NO GROWTH 4 DAYS Performed at [facility], [address] [place], [place] [address]    Report Status PENDING  Incomplete  MRSA Next Gen by PCR, Nasal     Status: None   Collection Time: 07/07/2022 11:52 PM   Specimen: Nasal Mucosa; Nasal Swab  Result Value Ref Range Status   MRSA by PCR Next Gen NOT DETECTED   NOT DETECTED Final    Comment: (NOTE) The GeneXpert MRSA Assay (FDA approved for NASAL specimens only), is one component of a comprehensive MRSA colonization surveillance program. It is not intended to diagnose MRSA infection nor to guide or monitor treatment for MRSA infections. Test performance is not FDA approved in patients less than 2 years old. Performed at [facility], [address] [place], [place] [address]   Gram stain     Status: None   Collection Time: 07/14/22  5:02 AM   Specimen: Fluid  Result Value Ref Range Status   Specimen Description FLUID PERITONEAL  Final   Special Requests NONE  Final   Gram Stain   Final    NO ORGANISMS SEEN NO WBC SEEN Performed at [facility], [address] [place], [place] [address]    Report Status 07/14/2022 FINAL  Final         Radiology Studies: ECHO TEE  Result Date: 07/21/2022    TRANSESOPHOGEAL ECHO REPORT   Patient Name:   Erika Fry Date of Exam: 07/07/2022 Medical Rec #:  [id number]          Height:       65.0 in Accession #:    [id number]         Weight:       176.8 lb Date of Birth:  08/16/1946          BSA:          1.877 m Patient Age:    76 years           BP:           85/43 mmHg Patient Gender: F                  HR:           61 bpm. Exam Location:  Inpatient Procedure: 3D Echo, Transesophageal Echo, Cardiac Doppler and Color Doppler Indications:     I35.2 Nonrheumatic aortic (valve) stenosis with insufficiency;                  I35.8 Other nonrheumatic aortic valve disorders  History:          Patient has prior history of Echocardiogram examinations, most                  recent 07/15/2022. Pacemaker, Aortic Valve Disease and Mitral                  Valve Disease, Signs/Symptoms:Bacteremia, Fever and Edema; Risk                  Factors:Diabetes. Shock. Aortic stenosis.  Sonographer:     Tina West RDCS Referring Phys:  MANISH J PATWARDHAN Diagnosing Phys: Jay Ganji MD PROCEDURE: After discussion of the risks and benefits of a TEE, an informed consent was obtained from the patient. The transesophogeal probe was passed without difficulty through the esophogus of the patient. Imaged were obtained with the patient in a left lateral decubitus position. Sedation performed by different physician. The patient was monitored while under deep sedation. Anesthestetic sedation was provided intravenously by Anesthesiology: 182mg of Propofol. The patient's vital signs; including heart rate, blood pressure, and oxygen saturation; remained stable throughout the procedure. The patient developed no complications during the procedure.  IMPRESSIONS  1. Left ventricular ejection fraction, by estimation, is 60 to 65%. The left ventricle has normal function. The left ventricle has no regional wall motion   abnormalities.  2. Right ventricular systolic function is normal. The right ventricular size is normal.  3. Left atrial size was moderately dilated. No left atrial/left atrial appendage thrombus was detected.  4. Pacemaker lead noted.  5. The mass is separate from the AV and appears to have similar echogenicity of the pacemaker wire? complex atheroma measuring 2.1x0.7 cm?. The pacemaker leads appear to be intact. . There is mild dilatation of the ascending aorta, measuring 37 mm. There is Severe, mobile (Grade V) protruding plaque involving the ascending aorta.  6. The aortic valve is calcified. There is severe calcifcation of the aortic valve. There is moderate thickening of the aortic valve. Aortic valve regurgitation is  moderate. Moderate aortic valve stenosis. Aortic valve area, by VTI measures 0.83 cm. Aortic valve mean gradient measures 41.0 mmHg. Aortic valve Vmax measures 4.39 m/s.  7. The mitral valve is degenerative. Moderate mitral valve regurgitation. No evidence of mitral stenosis. Moderate mitral annular calcification.  8. Conclusion(s)/Recommendation(s): Procedure done in a relative short time due to complexity of the patient's physical condition. No TEE e/o Endocarditis. FINDINGS  Left Ventricle: Left ventricular ejection fraction, by estimation, is 60 to 65%. The left ventricle has normal function. The left ventricle has no regional wall motion abnormalities. The left ventricular internal cavity size was normal in size. There is  no left ventricular hypertrophy. Right Ventricle: The right ventricular size is normal. No increase in right ventricular wall thickness. Right ventricular systolic function is normal. Left Atrium: Left atrial size was moderately dilated. No left atrial/left atrial appendage thrombus was detected. Right Atrium: Right atrial size was normal in size. Pacemaker lead noted. Pericardium: There is no evidence of pericardial effusion. Mitral Valve: The mitral valve is degenerative in appearance. There is moderate thickening of the mitral valve leaflet(s). Mildly decreased mobility of the mitral valve leaflets. Moderate mitral annular calcification. Moderate mitral valve regurgitation.  No evidence of mitral valve stenosis. Tricuspid Valve: The tricuspid valve is normal in structure. Tricuspid valve regurgitation is trivial. Aortic Valve: The aortic valve is calcified. There is severe calcifcation of the aortic valve. There is moderate thickening of the aortic valve. There is mild aortic valve annular calcification. Aortic valve regurgitation is moderate. Aortic regurgitation PHT measures 527 msec. Moderate aortic stenosis is present. Aortic valve mean gradient measures 41.0 mmHg. Aortic valve peak  gradient measures 77.1 mmHg. Aortic valve area, by VTI measures 0.83 cm. Pulmonic Valve: The pulmonic valve was normal in structure. Pulmonic valve regurgitation is not visualized. No evidence of pulmonic stenosis. Aorta: The mass is separate from the AV and appears to have similar echogenicity of the pacemaker wire? complex atheroma measuring 2.1x0.7 cm?. The pacemaker leads appear to be intact. The aortic root is normal in size and structure. There is mild dilatation of the ascending aorta, measuring 37 mm. There is severe, mobile (Grade V) protruding plaque involving the ascending aorta. Venous: The inferior vena cava was not well visualized. IAS/Shunts: No atrial level shunt detected by color flow Doppler. Additional Comments: A device lead is visualized.  LEFT VENTRICLE PLAX 2D LVOT diam:     2.07 cm LV SV:         85 LV SV Index:   45 LVOT Area:     3.35 cm  AORTIC VALVE AV Area (Vmax):    0.85 cm AV Area (Vmean):   0.87 cm AV Area (VTI):     0.83 cm AV Vmax:           439.00   cm/s AV Vmean:          293.333 cm/s AV VTI:            1.020 m AV Peak Grad:      77.1 mmHg AV Mean Grad:      41.0 mmHg LVOT Vmax:         111.00 cm/s LVOT Vmean:        75.850 cm/s LVOT VTI:          0.252 m LVOT/AV VTI ratio: 0.25 AI PHT:            527 msec MR Peak grad:    101.6 mmHg MR Mean grad:    64.0 mmHg    SHUNTS MR Vmax:         504.00 cm/s  Systemic VTI:  0.25 m MR Vmean:        369.0 cm/s   Systemic Diam: 2.07 cm MR PISA:         1.01 cm MR PISA Eff ROA: 8 mm MR PISA Radius:  0.40 cm Adrian Prows MD Electronically signed by Adrian Prows MD Signature Date/Time: 07/19/2022/9:03:47 PM    Final         Scheduled Meds:  Chlorhexidine Gluconate Cloth  6 each Topical Daily   escitalopram  5 mg Oral Daily   ezetimibe  10 mg Oral Daily   ferric citrate  420 mg Oral TID WC   gentamicin cream  1 Application Topical Daily   heparin injection (subcutaneous)  5,000 Units Subcutaneous Q8H   insulin aspart  0-6 Units  Subcutaneous TID WC   levothyroxine  75 mcg Oral QAC breakfast   midodrine  10 mg Oral TID with meals   pantoprazole  40 mg Oral Daily   polyethylene glycol  17 g Oral Daily   senna  1 tablet Oral QHS   simvastatin  40 mg Oral QHS   sodium bicarbonate  650 mg Oral BID   Continuous Infusions:  sodium chloride Stopped (07/14/22 0109)   sodium chloride Stopped (07/15/22 2254)   sodium chloride Stopped (07/18/22 0040)   ceFEPime (MAXIPIME) IV Stopped (07/18/22 0033)   dialysis solution 1.5% low-MG/low-CA       LOS: 5 days    Time spent: 35 minutes    Irine Seal, MD [facility]   To contact the attending provider between 7A-7P or the covering provider during after hours 7P-7A, please log into the web site [email or web address] and access using universal [place] password for that web site. If you do not have the password, please call the hospital operator.  07/18/2022, 12:32 PM

## 2022-07-19 DIAGNOSIS — A419 Sepsis, unspecified organism: Secondary | ICD-10-CM | POA: Diagnosis not present

## 2022-07-19 DIAGNOSIS — N186 End stage renal disease: Secondary | ICD-10-CM | POA: Diagnosis not present

## 2022-07-19 DIAGNOSIS — E876 Hypokalemia: Secondary | ICD-10-CM | POA: Diagnosis not present

## 2022-07-19 LAB — CBC
HCT: 33.9 % — ABNORMAL LOW (ref 36.0–46.0)
Hemoglobin: 11 g/dL — ABNORMAL LOW (ref 12.0–15.0)
MCH: 31.3 pg (ref 26.0–34.0)
MCHC: 32.4 g/dL (ref 30.0–36.0)
MCV: 96.6 fL (ref 80.0–100.0)
Platelets: 148 10*3/uL — ABNORMAL LOW (ref 150–400)
RBC: 3.51 MIL/uL — ABNORMAL LOW (ref 3.87–5.11)
RDW: 14.3 % (ref 11.5–15.5)
WBC: 12 10*3/uL — ABNORMAL HIGH (ref 4.0–10.5)
nRBC: 0 % (ref 0.0–0.2)

## 2022-07-19 LAB — GLUCOSE, CAPILLARY
Glucose-Capillary: 134 mg/dL — ABNORMAL HIGH (ref 70–99)
Glucose-Capillary: 169 mg/dL — ABNORMAL HIGH (ref 70–99)
Glucose-Capillary: 119 mg/dL — ABNORMAL HIGH (ref 70–99)
Glucose-Capillary: 108 mg/dL — ABNORMAL HIGH (ref 70–99)

## 2022-07-19 LAB — BASIC METABOLIC PANEL
Anion gap: 13 (ref 5–15)
BUN: 35 mg/dL — ABNORMAL HIGH (ref 8–23)
CO2: 24 mmol/L (ref 22–32)
Calcium: 9.4 mg/dL (ref 8.9–10.3)
Chloride: 94 mmol/L — ABNORMAL LOW (ref 98–111)
Creatinine, Ser: 6.36 mg/dL — ABNORMAL HIGH (ref 0.44–1.00)
GFR, Estimated: 6 mL/min — ABNORMAL LOW (ref >60)
Glucose, Bld: 211 mg/dL — ABNORMAL HIGH (ref 70–99)
Potassium: 3.7 mmol/L (ref 3.5–5.1)
Sodium: 131 mmol/L — ABNORMAL LOW (ref 135–145)

## 2022-07-19 LAB — MAGNESIUM: Magnesium: 2.2 mg/dL (ref 1.7–2.4)

## 2022-07-19 LAB — CULTURE, BODY FLUID W GRAM STAIN -BOTTLE: Culture: NO GROWTH

## 2022-07-19 NOTE — Plan of Care (Signed)
  Problem: Clinical Measurements: Goal: Respiratory complications will improve Outcome: Progressing Goal: Cardiovascular complication will be avoided Outcome: Progressing   Problem: Nutrition: Goal: Adequate nutrition will be maintained Outcome: Progressing   Problem: Elimination: Goal: Will not experience complications related to bowel motility Outcome: Progressing Goal: Will not experience complications related to urinary retention Outcome: Progressing   Problem: Activity: Goal: Risk for activity intolerance will decrease Outcome: Not Progressing   Problem: Coping: Goal: Level of anxiety will decrease Outcome: Not Progressing

## 2022-07-19 NOTE — Progress Notes (Signed)
[facility] Progress Note    Assessment/ Plan:   ESRD on CCPD -will c/w nightly PD, will continue to utilize all 1.5% bags -caution w/ fluids -backup access: RUE AVF  Hypotension -acute on chronic. Possible sepsis, source unclear-abx per primary.  -pressor support per primary service, currently off -on midodrine as well -PD cell count reassuring-not indicating infection -EF 60-65% on TEE   Possible vegetation on pacer lead and aorta -TEE 8/15 complex plaque to ascending aortic wall. No valvular vegetation   Hypovolemia -s/p isotonic fluids  Hyperkalemia -resolved. Now low, can replete PRN -KCl given yesterday, K improved to 3.7  Hyponatremia -improved to 131  Anemia of CKD -hgb acceptable/at goal. Not on ESAs as outpatient. Avoiding IV Fe for now possible sepsis  CKD MBD -c/w auryxia -renal diet  B/L chronic venous stasis -per primary   OP PD Orders: Follows at [facility]. 7d/ wk, 75kg, 6 exchanges overnight, 2800cc , 1.5h dwell, no daybag - last Hb 12.9 on 8/2, last mircera 6/26 - last pth 1027, phos 9 - no esa or vdra  Gean Quint, MD [facility]   Subjective:   No acute events. Tolerated PD overnight. Finally had a BM with miralax. No complaints currently.   Objective:   BP (!) 99/53 (BP Location: Right Leg)   Pulse 77   Temp 98.6 F (37 C) (Oral)   Resp 18   Ht '5\' 5"'$  (1.651 m)   Wt 80.9 kg   SpO2 96%   BMI 29.68 kg/m   Intake/Output Summary (Last 24 hours) at 07/19/2022 1048 Last data filed at 07/19/2022 1022 Gross per 24 hour  Intake 239.39 ml  Output 766 ml  Net -526.61 ml   Weight change: 3.1 kg  Physical Exam: Gen:nad CVS:RRR Resp:CTA BL Abd: RLQ PD site c/d/I, soft, nontender, nondistended XBJ:YNWGN pedal edema, sensitive to touch/tender to touch mostly in b/l ankles Neuro: awake, alert Dialysis access: PD cath, RUE AVF +b/t  Imaging: No results found.  Labs: BMET Recent Labs  Lab 07/14/22 0014  07/14/22 1003 07/15/22 0705 07/15/2022 0732 07/17/22 0618 07/18/22 1703 07/19/22 0823  NA 126* 126* 128* 129* 129* 130* 131*  K 5.3* 3.6 3.3* 2.9* 2.9* 3.0* 3.7  CL 88* 85* 92* 94* 92* 93* 94*  CO2 19* 18* 16* 19* 21* 26 24  GLUCOSE 129* 193* 154* 115* 116* 120* 211*  BUN 53* 57* 48* 46* 47* 39* 35*  CREATININE 7.79* 8.12* 7.18* 6.91* 7.18* 6.91* 6.36*  CALCIUM 9.2 9.5 9.3 9.1 9.3 9.3 9.4  PHOS  --  8.2*  --   --   --   --   --    CBC Recent Labs  Lab 07/19/2022 1546 07/14/22 0014 07/04/2022 0108 07/17/22 0618 07/18/22 1703 07/19/22 0823  WBC 27.5*   < > 12.9* 11.6* 12.0* 12.0*  NEUTROABS 21.2*  --   --   --   --   --   HGB 15.7*   < > 10.9* 11.0* 10.7* 11.0*  HCT 45.9   < > 33.2* 32.7* 31.5* 33.9*  MCV 93.1   < > 96.2 93.4 93.8 96.6  PLT 253   < > 143* 170 151 148*   < > = values in this interval not displayed.    Medications:     Chlorhexidine Gluconate Cloth  6 each Topical Daily   escitalopram  5 mg Oral Daily   ezetimibe  10 mg Oral Daily   ferric citrate  420 mg Oral TID  WC   gentamicin cream  1 Application Topical Daily   heparin injection (subcutaneous)  5,000 Units Subcutaneous Q8H   insulin aspart  0-6 Units Subcutaneous TID WC   levothyroxine  75 mcg Oral QAC breakfast   midodrine  10 mg Oral TID with meals   pantoprazole  40 mg Oral Daily   polyethylene glycol  17 g Oral Daily   senna  1 tablet Oral QHS   simvastatin  40 mg Oral QHS   sodium bicarbonate  650 mg Oral BID

## 2022-07-19 NOTE — Progress Notes (Signed)
PROGRESS NOTE    Erika Fry  LJQ:[id number] DOB: [birth year] DOA: 07/09/2022 PCP: [name], MD    Chief Complaint  Patient presents with   Weakness    Brief Narrative: HPI Patient is a 76 year old female with pertinent PMH of ESRD on peritoneal dialysis, DMT2, anemia, thrombocytopenia, HFpEF, pacemaker in place presents to [facility] ED on 8/12 with generalized weakness.   Patient states she has been noticing fatigue and generalized weakness for the past 2 to 3 days.  Has had decreased appetite and not drinking as much due to generalized abdominal tenderness.  She has been doing her peritoneal dialysis.  Denies fever, chest pain, N/V/D.  States she has stopped taking home midodrine for about a week. Came to [facility] ED on 8/12 for further work-up.   Upon arrival to [facility] ED, BP 82/54.  Other vitals stable and on room air.  Afebrile.  Patient given IV fluids.  Patient states she felt better after fluids but BP remained low.  CXR with no significant findings.  CT abdomen/pelvis with no acute abnormality.  WBC 27.5, Hgb 15.7, NA 131, creat 8.22, BUN 50, alk phos 178, AG 23, CO2 20.  COVID/flu negative.  LA 2.7.  BC x2 obtained and patient started on cefepime/Vanco.  PCCM consulted for ICU admission given hypotension.   Assessment & Plan:   Principal Problem:   Septic shock ([place]) Active Problems:   ESRD on dialysis ([place])   Vaginal pain  #1 septic shock -Questionable etiology. -Patient noted on presentation to be in septic shock and had presented with fatigue, generalized weakness noted to be hypotensive in the ED with a significant leukocytosis white count of 27.5. -Blood cultures ordered with no growth to date. -Patient required pressors of Levophed which was subsequently weaned off currently on midodrine. -Peritoneal cultures with no growth. -Fever curve trended down, leukocytosis trended down. -Initial concern for endocarditis, TEE done 07/05/2022 negative for any  vegetations. -Was on IV Vanco, IV cefepime, IV vancomycin discontinued. -Continue IV cefepime empirically to complete a 7-day course of treatment.  2.  Hypomagnesemia/hypokalemia/hyponatremia -Currently undergoing peritoneal dialysis per nephrology. -Potassium at 3.7.  -Magnesium noted at 2.2.   -Repeat labs in the AM.   -Electrolyte management per nephrology.  3.  Moderate AAS/mild to moderate AI/moderate MR -Cardiology following. -Was on heparin drip which has subsequently been discontinued. -Outpatient follow-up with cardiology.  4.  Vaginal pain -Continue current pain regimen. -PCCM started bedtime oral Valium as needed at a low dose. -Trial of Valium 1 mg p.o. x1 on 07/18/2022 with management of vaginal pain per patient.   -Outpatient follow-up.  5.  Chronic anemia secondary to ESRD -Patient likely on presentation with hemoconcentration. -Hemoglobin currently stable at 11.0. -Per nephrology.  6.  ESRD on CCPD -Currently on nightly PD. -Per nephrology.  7.  Endocarditis ruled out -2D echo done on presentation with concern for thrombus on pacemaker lead and ascending aorta. -Patient seen in consultation by cardiology, underwent TEE which was negative for vegetations. -IV therapeutic heparin discontinued. -Continue IV antibiotics.    8.  Bilateral chronic venous stasis -Lower extremity Dopplers negative for DVT.  9.  Hypothyroidism -Continue Synthroid.   -Outpatient follow-up.    DVT prophylaxis: Heparin Code Status: Full Family Communication: Updated patient.  No family at bedside. Disposition: Likely home once antibiotics have been completed.  Status is: Inpatient Remains inpatient appropriate because: Severity of illness   Consultants:  Cardiology: Dr. Einar Gip 07/06/2022 Nephrology: Dr.Schertz 07/14/2022  Procedures:  TEE: 07/27/2022 per  Dr. Einar Gip 2D echo 07/15/2022  Significant Hospital Events: Including procedures, antibiotic start and stop dates in  addition to other pertinent events   8/12: admitted to [facility] w/ hypotension 8/14 - TTE showing pacemaker lead and aortic vegetation/thrombus. Cardiology consulted  Antimicrobials:  IV cefepime 07/03/2022>>>> IV vancomycin 07/11/2022>>>> 07/14/2022   Subjective: Laying in bed.  About to eat her lunch.  No chest pain or shortness of breath.  Stated Valium helped some with her chronic vaginal pain yesterday and was able to get some rest.  Objective: Vitals:   07/18/22 1653 07/18/22 2028 07/19/22 0504 07/19/22 0936  BP: (!) 96/55 (!) 98/52 103/61 (!) 99/53  Pulse: 76 61 79 77  Resp: 19 18 17 18   Temp: 98 F (36.7 C) 98 F (36.7 C) 98.2 F (36.8 C) 98.6 F (37 C)  TempSrc: Oral Oral Oral Oral  SpO2: 98% 97% 98% 96%  Weight:   80.9 kg   Height:        Intake/Output Summary (Last 24 hours) at 07/19/2022 1220 Last data filed at 07/19/2022 1022 Gross per 24 hour  Intake 239.39 ml  Output 766 ml  Net -526.61 ml    Filed Weights   07/17/22 1355 07/18/22 0539 07/19/22 0504  Weight: 77.8 kg 80.1 kg 80.9 kg    Examination:  General exam: NAD. Respiratory system: CTA B.  Lungs clear to auscultation bilaterally anterior lung fields.  No wheezes, no crackles, no rhonchi.  Fair air movement.  Speaking in full sentences.   Cardiovascular system: RRR with 3/6 SEM.  No JVD, murmurs, rubs, gallops or clicks. No pedal edema.  Gastrointestinal system: Abdomen soft, nontender, obese, positive bowel sounds.  No rebound.  No guarding.  Central nervous system: Alert and oriented. No focal neurological deficits. Extremities: Symmetric 5 x 5 power. Skin: No rashes, lesions or ulcers Psychiatry: Judgement and insight appear normal. Mood & affect appropriate.     Data Reviewed: I have personally reviewed following labs and imaging studies  CBC: Recent Labs  Lab 07/14/2022 1546 07/14/22 0014 07/15/22 0705 07/26/2022 0108 07/17/22 0618 07/18/22 1703 07/19/22 0823  WBC 27.5*   < > 16.1* 12.9*  11.6* 12.0* 12.0*  NEUTROABS 21.2*  --   --   --   --   --   --   HGB 15.7*   < > 11.7* 10.9* 11.0* 10.7* 11.0*  HCT 45.9   < > 35.7* 33.2* 32.7* 31.5* 33.9*  MCV 93.1   < > 94.4 96.2 93.4 93.8 96.6  PLT 253   < > 187 143* 170 151 148*   < > = values in this interval not displayed.     Basic Metabolic Panel: Recent Labs  Lab 07/14/22 1003 07/15/22 0705 07/02/2022 0732 07/17/22 0618 07/18/22 1703 07/19/22 0823  NA 126* 128* 129* 129* 130* 131*  K 3.6 3.3* 2.9* 2.9* 3.0* 3.7  CL 85* 92* 94* 92* 93* 94*  CO2 18* 16* 19* 21* 26 24  GLUCOSE 193* 154* 115* 116* 120* 211*  BUN 57* 48* 46* 47* 39* 35*  CREATININE 8.12* 7.18* 6.91* 7.18* 6.91* 6.36*  CALCIUM 9.5 9.3 9.1 9.3 9.3 9.4  MG  --  1.7 1.6* 1.8 2.2 2.2  PHOS 8.2*  --   --   --   --   --      GFR: Estimated Creatinine Clearance: 7.7 mL/min (A) (by C-G formula based on SCr of 6.36 mg/dL (H)).  Liver Function Tests: Recent Labs  Lab 08/01/2022 1546  AST 21  ALT 21  ALKPHOS 178*  BILITOT 0.6  PROT 7.4  ALBUMIN 3.1*     CBG: Recent Labs  Lab 07/18/22 1202 07/18/22 1703 07/18/22 2041 07/19/22 0748 07/19/22 1128  GLUCAP 151* 109* 128* 134* 169*      Recent Results (from the past 240 hour(s))  SARS Coronavirus 2 by RT PCR (hospital order, performed in [facility] hospital lab) *cepheid single result test* Anterior Nasal Swab     Status: None   Collection Time: 07/29/2022  6:07 PM   Specimen: Anterior Nasal Swab  Result Value Ref Range Status   SARS Coronavirus 2 by RT PCR NEGATIVE NEGATIVE Final    Comment: (NOTE) SARS-CoV-2 target nucleic acids are NOT DETECTED.  The SARS-CoV-2 RNA is generally detectable in upper and lower respiratory specimens during the acute phase of infection. The lowest concentration of SARS-CoV-2 viral copies this assay can detect is 250 copies / mL. A negative result does not preclude SARS-CoV-2 infection and should not be used as the sole basis for treatment or other patient  management decisions.  A negative result may occur with improper specimen collection / handling, submission of specimen other than nasopharyngeal swab, presence of viral mutation(s) within the areas targeted by this assay, and inadequate number of viral copies (<250 copies / mL). A negative result must be combined with clinical observations, patient history, and epidemiological information.  Fact Sheet for Patients:   https://www.patel.info/  Fact Sheet for Healthcare Providers: https://hall.com/  This test is not yet approved or  cleared by the [place] FDA and has been authorized for detection and/or diagnosis of SARS-CoV-2 by FDA under an Emergency Use Authorization (EUA).  This EUA will remain in effect (meaning this test can be used) for the duration of the COVID-19 declaration under Section 564(b)(1) of the Act, 21 U.S.C. section 360bbb-3(b)(1), unless the authorization is terminated or revoked sooner.  Performed at [facility], [place] [address] [place], [place] [address]   Blood culture (routine x 2)     Status: None   Collection Time: 07/15/2022  6:09 PM   Specimen: BLOOD LEFT FOREARM  Result Value Ref Range Status   Specimen Description BLOOD LEFT FOREARM  Final   Special Requests   Final    BOTTLES DRAWN AEROBIC AND ANAEROBIC Blood Culture results may not be optimal due to an inadequate volume of blood received in culture bottles   Culture   Final    NO GROWTH 5 DAYS Performed at [facility], [place] [address] [place], [place] [address]    Report Status 07/18/2022 FINAL  Final  Blood culture (routine x 2)     Status: None   Collection Time: 07/02/2022  6:14 PM   Specimen: BLOOD  Result Value Ref Range Status   Specimen Description BLOOD LEFT ANTECUBITAL  Final   Special Requests   Final    BOTTLES DRAWN AEROBIC AND ANAEROBIC Blood Culture results may not be optimal due to an inadequate volume of blood received  in culture bottles   Culture   Final    NO GROWTH 5 DAYS Performed at [facility], [place] [address] [place],  [address]    Report Status 07/18/2022 FINAL  Final  Culture, body fluid w Gram Stain-bottle     Status: None   Collection Time: 07/25/2022 11:02 PM   Specimen: Fluid  Result Value Ref Range Status   Specimen Description FLUID PERITONEAL  Final   Special Requests  NONE  Final   Culture   Final    NO GROWTH 5 DAYS Performed at [facility], [place] [address] [place], [place] [address]    Report Status 07/19/2022 FINAL  Final  MRSA Next Gen by PCR, Nasal     Status: None   Collection Time: 07/18/2022 11:52 PM   Specimen: Nasal Mucosa; Nasal Swab  Result Value Ref Range Status   MRSA by PCR Next Gen NOT DETECTED NOT DETECTED Final    Comment: (NOTE) The GeneXpert MRSA Assay (FDA approved for NASAL specimens only), is one component of a comprehensive MRSA colonization surveillance program. It is not intended to diagnose MRSA infection nor to guide or monitor treatment for MRSA infections. Test performance is not FDA approved in patients less than 25 years old. Performed at [facility], [place] [address] [place], [place] [address]   Gram stain     Status: None   Collection Time: 07/14/22  5:02 AM   Specimen: Fluid  Result Value Ref Range Status   Specimen Description FLUID PERITONEAL  Final   Special Requests NONE  Final   Gram Stain   Final    NO ORGANISMS SEEN NO WBC SEEN Performed at [facility], [place] [address], [place], [place] [address]    Report Status 07/14/2022 FINAL  Final         Radiology Studies: No results found.      Scheduled Meds:  Chlorhexidine Gluconate Cloth  6 each Topical Daily   escitalopram  5 mg Oral Daily   ezetimibe  10 mg Oral Daily   ferric citrate  420 mg Oral TID WC   gentamicin cream  1 Application Topical Daily   heparin injection (subcutaneous)  5,000 Units Subcutaneous Q8H   insulin aspart  0-6  Units Subcutaneous TID WC   levothyroxine  75 mcg Oral QAC breakfast   midodrine  10 mg Oral TID with meals   pantoprazole  40 mg Oral Daily   polyethylene glycol  17 g Oral Daily   senna  1 tablet Oral QHS   simvastatin  40 mg Oral QHS   sodium bicarbonate  650 mg Oral BID   Continuous Infusions:  sodium chloride Stopped (07/14/22 0109)   sodium chloride Stopped (07/15/22 2254)   sodium chloride Stopped (07/19/22 0400)   ceFEPime (MAXIPIME) IV Stopped (07/18/22 2330)   dialysis solution 1.5% low-MG/low-CA       LOS: 6 days    Time spent: 35 minutes    Irine Seal, MD [facility]   To contact the attending provider between 7A-7P or the covering provider during after hours 7P-7A, please log into the web site [email or web address] and access using universal [place] password for that web site. If you do not have the password, please call the hospital operator.  07/19/2022, 12:20 PM

## 2022-07-19 NOTE — Progress Notes (Signed)
Physical Therapy Treatment Patient Details Name: Erika Fry MRN: [id number] DOB: 1946-04-08 Today's Date: 07/19/2022   History of Present Illness Patient is a 76 year old female presents to [facility] ED on 8/12 with generalized weakness.  Pt with septic shock, hypotension and questionable vegetation on pacer lead in aorta.  Pt for TEE 8/15.  PMH of ESRD on peritoneal dialysis, DMT2, anemia, thrombocytopenia, HFpEF, pacemaker    PT Comments    Pt was assessed for continued mobility, declined gait but agreed to do there exercise.  Her plan is to encourage and progress movement since pt goal is to return home and will be independent hopefully. Her weakness for any moving is a combination of systemic illness, poor cardiac health and need for general strengthening.  Follow acutely for goals of PT.     Recommendations for follow up therapy are one component of a multi-disciplinary discharge planning process, led by the attending physician.  Recommendations may be updated based on patient status, additional functional criteria and insurance authorization.  Follow Up Recommendations  Home health PT     Assistance Recommended at Discharge Intermittent Supervision/Assistance  Patient can return home with the following A little help with walking and/or transfers;A little help with bathing/dressing/bathroom;Assistance with cooking/housework;Assist for transportation;Help with stairs or ramp for entrance   Equipment Recommendations  None recommended by PT    Recommendations for Other Services       Precautions / Restrictions Precautions Precautions: Fall Restrictions Weight Bearing Restrictions: No     Mobility  Bed Mobility Overal bed mobility: Needs Assistance Bed Mobility: Rolling Rolling: Min assist         General bed mobility comments: scooting one to two assist to get up in bed    Transfers                   General transfer comment: declined    Ambulation/Gait                    Stairs             Wheelchair Mobility    Modified Rankin (Stroke Patients Only)       Balance                                            Cognition Arousal/Alertness: Awake/alert Behavior During Therapy: Flat affect Overall Cognitive Status: Within Functional Limits for tasks assessed                                 General Comments: reports she is really tired today        Exercises General Exercises - Lower Extremity Ankle Circles/Pumps: AAROM, 5 reps Quad Sets: AROM, 10 reps Heel Slides: AAROM, 10 reps Hip ABduction/ADduction: AAROM, 10 reps Straight Leg Raises: AAROM, 10 reps    General Comments General comments (skin integrity, edema, etc.): pt has fair leg movement but is uncomfortable with anything exerting so needs help to lift legs      Pertinent Vitals/Pain Pain Assessment Pain Assessment: No/denies pain    Home Living                          Prior Function            PT Goals (current  goals can now be found in the care plan section) Acute Rehab PT Goals Patient Stated Goal: to go home    Frequency    Min 3X/week      PT Plan Current plan remains appropriate    Co-evaluation              AM-PAC PT "6 Clicks" Mobility   Outcome Measure  Help needed turning from your back to your side while in a flat bed without using bedrails?: None Help needed moving from lying on your back to sitting on the side of a flat bed without using bedrails?: None Help needed moving to and from a bed to a chair (including a wheelchair)?: A Lot Help needed standing up from a chair using your arms (e.g., wheelchair or bedside chair)?: Total Help needed to walk in hospital room?: Total Help needed climbing 3-5 steps with a railing? : Total 6 Click Score: 13    End of Session   Activity Tolerance: Patient limited by fatigue Patient left: in bed;with call bell/phone within  reach;with bed alarm set Nurse Communication: Mobility status PT Visit Diagnosis: Muscle weakness (generalized) (M62.81);Difficulty in walking, not elsewhere classified (R26.2)     Time: 4801-6553 PT Time Calculation (min) (ACUTE ONLY): 28 min  Charges:  $Therapeutic Exercise: 8-22 mins $Therapeutic Activity: 8-22 mins   Ramond Dial 07/19/2022, 2:42 PM  Mee Hives, PT PhD Acute Rehab Dept. Number: [place] [facility] [place]

## 2022-07-20 DIAGNOSIS — E876 Hypokalemia: Secondary | ICD-10-CM | POA: Diagnosis not present

## 2022-07-20 DIAGNOSIS — A419 Sepsis, unspecified organism: Secondary | ICD-10-CM | POA: Diagnosis not present

## 2022-07-20 DIAGNOSIS — N186 End stage renal disease: Secondary | ICD-10-CM | POA: Diagnosis not present

## 2022-07-20 LAB — CBC
HCT: 34.7 % — ABNORMAL LOW (ref 36.0–46.0)
Hemoglobin: 11.4 g/dL — ABNORMAL LOW (ref 12.0–15.0)
MCH: 31.1 pg (ref 26.0–34.0)
MCHC: 32.9 g/dL (ref 30.0–36.0)
MCV: 94.8 fL (ref 80.0–100.0)
Platelets: 177 10*3/uL (ref 150–400)
RBC: 3.66 MIL/uL — ABNORMAL LOW (ref 3.87–5.11)
RDW: 14.4 % (ref 11.5–15.5)
WBC: 11.8 10*3/uL — ABNORMAL HIGH (ref 4.0–10.5)
nRBC: 0 % (ref 0.0–0.2)

## 2022-07-20 LAB — BASIC METABOLIC PANEL
Anion gap: 13 (ref 5–15)
BUN: 33 mg/dL — ABNORMAL HIGH (ref 8–23)
CO2: 26 mmol/L (ref 22–32)
Calcium: 9.8 mg/dL (ref 8.9–10.3)
Chloride: 93 mmol/L — ABNORMAL LOW (ref 98–111)
Creatinine, Ser: 6.58 mg/dL — ABNORMAL HIGH (ref 0.44–1.00)
GFR, Estimated: 6 mL/min — ABNORMAL LOW (ref >60)
Glucose, Bld: 161 mg/dL — ABNORMAL HIGH (ref 70–99)
Potassium: 3.2 mmol/L — ABNORMAL LOW (ref 3.5–5.1)
Sodium: 132 mmol/L — ABNORMAL LOW (ref 135–145)

## 2022-07-20 LAB — GLUCOSE, CAPILLARY
Glucose-Capillary: 139 mg/dL — ABNORMAL HIGH (ref 70–99)
Glucose-Capillary: 169 mg/dL — ABNORMAL HIGH (ref 70–99)
Glucose-Capillary: 106 mg/dL — ABNORMAL HIGH (ref 70–99)
Glucose-Capillary: 131 mg/dL — ABNORMAL HIGH (ref 70–99)

## 2022-07-20 MED ORDER — POTASSIUM CHLORIDE CRYS ER 20 MEQ PO TBCR
20.0000 meq | EXTENDED_RELEASE_TABLET | Freq: Once | ORAL | Status: AC
  Administered 2022-07-20: 20 meq via ORAL
  Filled 2022-07-20: qty 1

## 2022-07-20 NOTE — Progress Notes (Signed)
PROGRESS NOTE    Erika Fry  STM:[id number] DOB: 08/02/1946 DOA: 07/06/2022 PCP: [name], MD    Chief Complaint  Patient presents with   Weakness    Brief Narrative: HPI Patient is a 76 year old female with pertinent PMH of ESRD on peritoneal dialysis, DMT2, anemia, thrombocytopenia, HFpEF, pacemaker in place presents to [facility] ED on 8/12 with generalized weakness.   Patient states she has been noticing fatigue and generalized weakness for the past 2 to 3 days.  Has had decreased appetite and not drinking as much due to generalized abdominal tenderness.  She has been doing her peritoneal dialysis.  Denies fever, chest pain, N/V/D.  States she has stopped taking home midodrine for about a week. Came to [facility] ED on 8/12 for further work-up.   Upon arrival to [facility] ED, BP 82/54.  Other vitals stable and on room air.  Afebrile.  Patient given IV fluids.  Patient states she felt better after fluids but BP remained low.  CXR with no significant findings.  CT abdomen/pelvis with no acute abnormality.  WBC 27.5, Hgb 15.7, NA 131, creat 8.22, BUN 50, alk phos 178, AG 23, CO2 20.  COVID/flu negative.  LA 2.7.  BC x2 obtained and patient started on cefepime/Vanco.  PCCM consulted for ICU admission given hypotension.   Assessment & Plan:   Principal Problem:   Septic shock ([place]) Active Problems:   ESRD on dialysis ([place])   Vaginal pain  #1 septic shock -Questionable etiology. -Patient noted on presentation to be in septic shock and had presented with fatigue, generalized weakness noted to be hypotensive in the ED with a significant leukocytosis white count of 27.5. -Blood cultures ordered with no growth to date. -Patient required pressors of Levophed which was subsequently weaned off currently on midodrine. -Peritoneal cultures with no growth. -Fever curve trended down, leukocytosis trended down. -Initial concern for endocarditis, TEE done 07/29/2022 negative for any  vegetations. -Was on IV Vanco, IV cefepime, IV vancomycin discontinued. -Continue IV cefepime empirically to complete a 7-day course of treatment.  2.  Hypomagnesemia/hypokalemia/hyponatremia -Currently undergoing peritoneal dialysis per nephrology. -Potassium at 3.2 -Magnesium noted at 2.2.   -KDur 20 mEq p.o. x1. -Repeat labs in the AM.   -Electrolyte management per nephrology.  3.  Moderate AAS/mild to moderate AI/moderate MR -Cardiology following. -Was on heparin drip which has subsequently been discontinued. -Outpatient follow-up with cardiology.  4.  Vaginal pain -Continue current pain regimen. -PCCM started bedtime oral Valium as needed at a low dose. -Trial of Valium 1 mg p.o. x1 on 07/18/2022 with management of vaginal pain per patient.   -Outpatient follow-up.  5.  Chronic anemia secondary to ESRD -Patient likely on presentation with hemoconcentration. -Hemoglobin currently stable at 11.4. -Per nephrology.  6.  ESRD on CCPD -Currently on nightly PD. -Per nephrology.  7.  Endocarditis ruled out -2D echo done on presentation with concern for thrombus on pacemaker lead and ascending aorta. -Patient seen in consultation by cardiology, underwent TEE which was negative for vegetations. -IV therapeutic heparin discontinued. -Continue IV antibiotics.    8.  Bilateral chronic venous stasis -Lower extremity Dopplers negative for DVT.  9.  Hypothyroidism -Synthroid  -Outpatient follow-up.   DVT prophylaxis: Heparin Code Status: Full Family Communication: Updated patient.  No family at bedside. Disposition: Likely home once antibiotics have been completed, on 07/22/2022 with home health.  Status is: Inpatient Remains inpatient appropriate because: Severity of illness   Consultants:  Cardiology: Dr. Einar Gip 07/02/2022 Nephrology: Dr.Schertz 07/14/2022  Procedures:  TEE: 07/24/2022 per Dr. Einar Gip 2D echo 07/15/2022  Significant Hospital Events: Including procedures,  antibiotic start and stop dates in addition to other pertinent events   8/12: admitted to [facility] w/ hypotension 8/14 - TTE showing pacemaker lead and aortic vegetation/thrombus. Cardiology consulted  Antimicrobials:  IV cefepime 07/06/2022>>>> IV vancomycin 07/21/2022>>>> 07/14/2022   Subjective: Laying in bed.  Still with chronic vaginal pain.  No chest pain.  No shortness of breath.  No abdominal pain.  States she is significantly weak however does not want to go to SNF.   Objective: Vitals:   07/19/22 2053 07/20/22 0425 07/20/22 0813 07/20/22 1721  BP: (!) 85/51 (!) 95/56 (!) 91/52 (!) 91/52  Pulse: 78 69 68 72  Resp: 18 20 17 18   Temp: 98.7 F (37.1 C) 99 F (37.2 C) 98.8 F (37.1 C)   TempSrc: Oral Oral Oral   SpO2: 99% 98% 97% 95%  Weight:      Height:        Intake/Output Summary (Last 24 hours) at 07/20/2022 1734 Last data filed at 07/20/2022 1300 Gross per 24 hour  Intake 800 ml  Output 0 ml  Net 800 ml    Filed Weights   07/17/22 1355 07/18/22 0539 07/19/22 0504  Weight: 77.8 kg 80.1 kg 80.9 kg    Examination:  General exam: NAD. Respiratory system: Lungs clear to auscultation bilaterally.  No wheezes, no crackles, no rhonchi.  Fair air movement.  Cardiovascular system: RRR with 3/6 SEM.  No JVD, murmurs, rubs, gallops or clicks. No pedal edema.  Gastrointestinal system: Abdomen soft, nontender, obese, positive bowel sounds.  No rebound.  No guarding.  Central nervous system: Alert and oriented. No focal neurological deficits. Extremities: Symmetric 5 x 5 power. Skin: No rashes, lesions or ulcers Psychiatry: Judgement and insight appear normal. Mood & affect appropriate.     Data Reviewed: I have personally reviewed following labs and imaging studies  CBC: Recent Labs  Lab 07/12/2022 0108 07/17/22 0618 07/18/22 1703 07/19/22 0823 07/20/22 0936  WBC 12.9* 11.6* 12.0* 12.0* 11.8*  HGB 10.9* 11.0* 10.7* 11.0* 11.4*  HCT 33.2* 32.7* 31.5* 33.9* 34.7*   MCV 96.2 93.4 93.8 96.6 94.8  PLT 143* 170 151 148* 177     Basic Metabolic Panel: Recent Labs  Lab 07/14/22 1003 07/15/22 0705 07/27/2022 0732 07/17/22 0618 07/18/22 1703 07/19/22 0823 07/20/22 0936  NA 126* 128* 129* 129* 130* 131* 132*  K 3.6 3.3* 2.9* 2.9* 3.0* 3.7 3.2*  CL 85* 92* 94* 92* 93* 94* 93*  CO2 18* 16* 19* 21* 26 24 26   GLUCOSE 193* 154* 115* 116* 120* 211* 161*  BUN 57* 48* 46* 47* 39* 35* 33*  CREATININE 8.12* 7.18* 6.91* 7.18* 6.91* 6.36* 6.58*  CALCIUM 9.5 9.3 9.1 9.3 9.3 9.4 9.8  MG  --  1.7 1.6* 1.8 2.2 2.2  --   PHOS 8.2*  --   --   --   --   --   --      GFR: Estimated Creatinine Clearance: 7.4 mL/min (A) (by C-G formula based on SCr of 6.58 mg/dL (H)).  Liver Function Tests: No results for input(s): "AST", "ALT", "ALKPHOS", "BILITOT", "PROT", "ALBUMIN" in the last 168 hours.   CBG: Recent Labs  Lab 07/19/22 1640 07/19/22 2050 07/20/22 0709 07/20/22 1128 07/20/22 1715  GLUCAP 119* 108* 139* 169* 106*      Recent Results (from the past 240 hour(s))  SARS Coronavirus 2 by RT PCR (hospital  order, performed in [facility] hospital lab) *cepheid single result test* Anterior Nasal Swab     Status: None   Collection Time: 07/25/2022  6:07 PM   Specimen: Anterior Nasal Swab  Result Value Ref Range Status   SARS Coronavirus 2 by RT PCR NEGATIVE NEGATIVE Final    Comment: (NOTE) SARS-CoV-2 target nucleic acids are NOT DETECTED.  The SARS-CoV-2 RNA is generally detectable in upper and lower respiratory specimens during the acute phase of infection. The lowest concentration of SARS-CoV-2 viral copies this assay can detect is 250 copies / mL. A negative result does not preclude SARS-CoV-2 infection and should not be used as the sole basis for treatment or other patient management decisions.  A negative result may occur with improper specimen collection / handling, submission of specimen other than nasopharyngeal swab, presence of viral  mutation(s) within the areas targeted by this assay, and inadequate number of viral copies (<250 copies / mL). A negative result must be combined with clinical observations, patient history, and epidemiological information.  Fact Sheet for Patients:   https://www.patel.info/  Fact Sheet for Healthcare Providers: https://hall.com/  This test is not yet approved or  cleared by the [place] FDA and has been authorized for detection and/or diagnosis of SARS-CoV-2 by FDA under an Emergency Use Authorization (EUA).  This EUA will remain in effect (meaning this test can be used) for the duration of the COVID-19 declaration under Section 564(b)(1) of the Act, 21 U.S.C. section 360bbb-3(b)(1), unless the authorization is terminated or revoked sooner.  Performed at [facility], [place] [address] [place], [place] [address]   Blood culture (routine x 2)     Status: None   Collection Time: 07/12/2022  6:09 PM   Specimen: BLOOD LEFT FOREARM  Result Value Ref Range Status   Specimen Description BLOOD LEFT FOREARM  Final   Special Requests   Final    BOTTLES DRAWN AEROBIC AND ANAEROBIC Blood Culture results may not be optimal due to an inadequate volume of blood received in culture bottles   Culture   Final    NO GROWTH 5 DAYS Performed at [facility], [place] [address], [place], [place] [address]    Report Status 07/18/2022 FINAL  Final  Blood culture (routine x 2)     Status: None   Collection Time: 07/12/2022  6:14 PM   Specimen: BLOOD  Result Value Ref Range Status   Specimen Description BLOOD LEFT ANTECUBITAL  Final   Special Requests   Final    BOTTLES DRAWN AEROBIC AND ANAEROBIC Blood Culture results may not be optimal due to an inadequate volume of blood received in culture bottles   Culture   Final    NO GROWTH 5 DAYS Performed at [facility], [place] [address] [place], [place] [address]    Report Status 07/18/2022 FINAL   Final  Culture, body fluid w Gram Stain-bottle     Status: None   Collection Time: 07/20/2022 11:02 PM   Specimen: Fluid  Result Value Ref Range Status   Specimen Description FLUID PERITONEAL  Final   Special Requests NONE  Final   Culture   Final    NO GROWTH 5 DAYS Performed at [place] [address] [place], [place] [address]    Report Status 07/19/2022 FINAL  Final  MRSA Next Gen by PCR, Nasal     Status: None   Collection Time: 07/21/2022 11:52 PM   Specimen: Nasal Mucosa;  Nasal Swab  Result Value Ref Range Status   MRSA by PCR Next Gen NOT DETECTED NOT DETECTED Final    Comment: (NOTE) The GeneXpert MRSA Assay (FDA approved for NASAL specimens only), is one component of a comprehensive MRSA colonization surveillance program. It is not intended to diagnose MRSA infection nor to guide or monitor treatment for MRSA infections. Test performance is not FDA approved in patients less than 50 years old. Performed at [facility], [place] [address] [place], [place] [address]   Gram stain     Status: None   Collection Time: 07/14/22  5:02 AM   Specimen: Fluid  Result Value Ref Range Status   Specimen Description FLUID PERITONEAL  Final   Special Requests NONE  Final   Gram Stain   Final    NO ORGANISMS SEEN NO WBC SEEN Performed at [facility], [place] [address], [place],  [address]    Report Status 07/14/2022 FINAL  Final         Radiology Studies: No results found.      Scheduled Meds:  Chlorhexidine Gluconate Cloth  6 each Topical Daily   escitalopram  5 mg Oral Daily   ezetimibe  10 mg Oral Daily   ferric citrate  420 mg Oral TID WC   gentamicin cream  1 Application Topical Daily   heparin injection (subcutaneous)  5,000 Units Subcutaneous Q8H   insulin aspart  0-6 Units Subcutaneous TID WC   levothyroxine  75 mcg Oral QAC breakfast   midodrine  10 mg Oral TID with meals   pantoprazole  40 mg Oral Daily   polyethylene glycol  17 g  Oral Daily   senna  1 tablet Oral QHS   simvastatin  40 mg Oral QHS   sodium bicarbonate  650 mg Oral BID   Continuous Infusions:  sodium chloride Stopped (07/14/22 0109)   sodium chloride Stopped (07/15/22 2254)   sodium chloride Stopped (07/19/22 0400)   dialysis solution 1.5% low-MG/low-CA       LOS: 7 days    Time spent: 35 minutes    Irine Seal, MD [facility]   To contact the attending provider between 7A-7P or the covering provider during after hours 7P-7A, please log into the web site [email or web address] and access using universal [place] password for that web site. If you do not have the password, please call the hospital operator.  07/20/2022, 5:34 PM

## 2022-07-20 NOTE — Progress Notes (Addendum)
[facility] Progress Note   Subjective:   Patient seen and examined at bedside.  Reports tolerating PD well overnight.  Abdominal pain this AM, states she needs to have BM.  Just finished miralax.  Denies CP, SOB and n/v/d.  Net UF 735m.   Objective Vitals:   07/19/22 1642 07/19/22 2053 07/20/22 0425 07/20/22 0813  BP: (!) 90/54 (!) 85/51 (!) 95/56 (!) 91/52  Pulse: 72 78 69 68  Resp: '19 18 20 17  '$ Temp: 97.9 F (36.6 C) 98.7 F (37.1 C) 99 F (37.2 C) 98.8 F (37.1 C)  TempSrc: Oral Oral Oral Oral  SpO2: 99% 99% 98% 97%  Weight:      Height:       Physical Exam General:chronically ill appearing female in NAD Heart:RRR, no mrg Lungs:CTAB, nml WOB on RA Abdomen:soft, NT, mildly distended Extremities:no LE edema, +tenderness Dialysis Access: PD cath   [facility]   07/17/22 1355 07/18/22 0539 07/19/22 0504  Weight: 77.8 kg 80.1 kg 80.9 kg    Intake/Output Summary (Last 24 hours) at 07/20/2022 1239 Last data filed at 07/20/2022 0900 Gross per 24 hour  Intake 580 ml  Output 0 ml  Net 580 ml    Additional Objective Labs: Basic Metabolic Panel: Recent Labs  Lab 07/14/22 1003 07/15/22 0705 07/18/22 1703 07/19/22 0823 07/20/22 0936  NA 126*   < > 130* 131* 132*  K 3.6   < > 3.0* 3.7 3.2*  CL 85*   < > 93* 94* 93*  CO2 18*   < > '26 24 26  '$ GLUCOSE 193*   < > 120* 211* 161*  BUN 57*   < > 39* 35* 33*  CREATININE 8.12*   < > 6.91* 6.36* 6.58*  CALCIUM 9.5   < > 9.3 9.4 9.8  PHOS 8.2*  --   --   --   --    < > = values in this interval not displayed.   Liver Function Tests: Recent Labs  Lab 07/06/2022 1546  AST 21  ALT 21  ALKPHOS 178*  BILITOT 0.6  PROT 7.4  ALBUMIN 3.1*   Recent Labs  Lab 07/14/22 0014  LIPASE 39   CBC: Recent Labs  Lab 07/22/2022 1546 07/14/22 0014 07/03/2022 0108 07/17/22 0618 07/18/22 1703 07/19/22 0823 07/20/22 0936  WBC 27.5*   < > 12.9* 11.6* 12.0* 12.0* 11.8*  NEUTROABS 21.2*  --   --   --   --   --   --    HGB 15.7*   < > 10.9* 11.0* 10.7* 11.0* 11.4*  HCT 45.9   < > 33.2* 32.7* 31.5* 33.9* 34.7*  MCV 93.1   < > 96.2 93.4 93.8 96.6 94.8  PLT 253   < > 143* 170 151 148* 177   < > = values in this interval not displayed.   CBG: Recent Labs  Lab 07/19/22 1128 07/19/22 1640 07/19/22 2050 07/20/22 0709 07/20/22 1128  GLUCAP 169* 119* 108* 139* 169*    Medications:  sodium chloride Stopped (07/14/22 0109)   sodium chloride Stopped (07/15/22 2254)   sodium chloride Stopped (07/19/22 0400)   dialysis solution 1.5% low-MG/low-CA      Chlorhexidine Gluconate Cloth  6 each Topical Daily   escitalopram  5 mg Oral Daily   ezetimibe  10 mg Oral Daily   ferric citrate  420 mg Oral TID WC   gentamicin cream  1 Application Topical Daily   heparin injection (subcutaneous)  5,000  Units Subcutaneous Q8H   insulin aspart  0-6 Units Subcutaneous TID WC   levothyroxine  75 mcg Oral QAC breakfast   midodrine  10 mg Oral TID with meals   pantoprazole  40 mg Oral Daily   polyethylene glycol  17 g Oral Daily   senna  1 tablet Oral QHS   simvastatin  40 mg Oral QHS   sodium bicarbonate  650 mg Oral BID    Dialysis Orders: CCPD. Follows at [facility]. 7d/ wk, 75kg, 6 exchanges overnight, 2800cc , 1.5h dwell, no daybag - last Hb 12.9 on 8/2, last mircera 6/26 - last pth 1027, phos 9 - no esa or vdra  Assessment/Plan: 1. Hypotension - weaned off pressors. On midodrine. EF 60-65% on TEE. 2. Leukocytosis/Fever - etiology unclear. Now afebrile and WBC WNL.  [facility] & Peritoneal culture w/NGTD. Initial concern for endocarditis but TEE with no vegetations. On cefepime. Vanc d/c.  3. Hypokalemia - K 3.2 today.  Replete prn.  4. Hyponatremia - improved. Na 132.  5. Hypomagnesemia - Improved. Last Mg 2.2.  6. ESRD - on PD.  Continue nightly.  Using 1.5% bags.  7. Anemia of CKD- Hgb 11.4. No indication for ESA.  8. Secondary hyperparathyroidism - Ca ok. Phos elevated, continue binders.  9. Volume - Does not  appear volume overloaded.  Continue to use 1.5% dialysate.  10. Nutrition - Renal diet w/fluid restrictions.  11. Moderate AAS/mild-mod AI/mod MR - to have OP follow w/cardio 12. B/l LE pain/chronic venous stasis - LE doppler negative for DVT 13. Hypothyroidism - on synthroid.   Jen Mow, PA-C [facility] 07/20/2022,12:39 PM  LOS: 7 days

## 2022-07-20 NOTE — Plan of Care (Signed)
  Problem: Education: Goal: Knowledge of General Education information will improve Description: Including pain rating scale, medication(s)/side effects and non-pharmacologic comfort measures Outcome: Progressing   Problem: Health Behavior/Discharge Planning: Goal: Ability to manage health-related needs will improve Outcome: Progressing   Problem: Clinical Measurements: Goal: Ability to maintain clinical measurements within normal limits will improve Outcome: Progressing Goal: Will remain free from infection Outcome: Progressing Goal: Diagnostic test results will improve Outcome: Progressing Goal: Respiratory complications will improve Outcome: Progressing Goal: Cardiovascular complication will be avoided Outcome: Progressing   Problem: Coping: Goal: Level of anxiety will decrease Outcome: Progressing  Goal: Will not experience complications related to urinary retention Outcome: Progressing   Problem: Pain Managment: Goal: General experience of comfort will improve Outcome: Progressing   Problem: Safety: Goal: Ability to remain free from injury will improve Outcome: Progressing

## 2022-07-21 DIAGNOSIS — A419 Sepsis, unspecified organism: Secondary | ICD-10-CM | POA: Diagnosis not present

## 2022-07-21 DIAGNOSIS — N186 End stage renal disease: Secondary | ICD-10-CM | POA: Diagnosis not present

## 2022-07-21 DIAGNOSIS — E876 Hypokalemia: Secondary | ICD-10-CM | POA: Diagnosis not present

## 2022-07-21 LAB — CBC
HCT: 32.5 % — ABNORMAL LOW (ref 36.0–46.0)
Hemoglobin: 10.8 g/dL — ABNORMAL LOW (ref 12.0–15.0)
MCH: 32.1 pg (ref 26.0–34.0)
MCHC: 33.2 g/dL (ref 30.0–36.0)
MCV: 96.7 fL (ref 80.0–100.0)
Platelets: 182 10*3/uL (ref 150–400)
RBC: 3.36 MIL/uL — ABNORMAL LOW (ref 3.87–5.11)
RDW: 14.3 % (ref 11.5–15.5)
WBC: 13.2 10*3/uL — ABNORMAL HIGH (ref 4.0–10.5)
nRBC: 0 % (ref 0.0–0.2)

## 2022-07-21 LAB — BASIC METABOLIC PANEL
Anion gap: 13 (ref 5–15)
BUN: 33 mg/dL — ABNORMAL HIGH (ref 8–23)
CO2: 26 mmol/L (ref 22–32)
Calcium: 9.9 mg/dL (ref 8.9–10.3)
Chloride: 93 mmol/L — ABNORMAL LOW (ref 98–111)
Creatinine, Ser: 6.68 mg/dL — ABNORMAL HIGH (ref 0.44–1.00)
GFR, Estimated: 6 mL/min — ABNORMAL LOW (ref >60)
Glucose, Bld: 187 mg/dL — ABNORMAL HIGH (ref 70–99)
Potassium: 3.3 mmol/L — ABNORMAL LOW (ref 3.5–5.1)
Sodium: 132 mmol/L — ABNORMAL LOW (ref 135–145)

## 2022-07-21 LAB — GLUCOSE, CAPILLARY
Glucose-Capillary: 137 mg/dL — ABNORMAL HIGH (ref 70–99)
Glucose-Capillary: 109 mg/dL — ABNORMAL HIGH (ref 70–99)
Glucose-Capillary: 126 mg/dL — ABNORMAL HIGH (ref 70–99)
Glucose-Capillary: 128 mg/dL — ABNORMAL HIGH (ref 70–99)

## 2022-07-21 MED ORDER — POTASSIUM CHLORIDE CRYS ER 20 MEQ PO TBCR
20.0000 meq | EXTENDED_RELEASE_TABLET | Freq: Once | ORAL | Status: AC
  Administered 2022-07-21: 20 meq via ORAL
  Filled 2022-07-21: qty 1

## 2022-07-21 MED ORDER — CLOTRIMAZOLE 1 % VA CREA
1.0000 | TOPICAL_CREAM | Freq: Every day | VAGINAL | Status: AC
  Administered 2022-07-21 – 2022-07-27 (×7): 1 via VAGINAL
  Filled 2022-07-21 (×2): qty 45

## 2022-07-21 MED ORDER — SENNA 8.6 MG PO TABS
1.0000 | ORAL_TABLET | Freq: Two times a day (BID) | ORAL | Status: DC
  Administered 2022-07-21 – 2022-07-22 (×3): 8.6 mg via ORAL
  Filled 2022-07-21 (×4): qty 1

## 2022-07-21 MED ORDER — SORBITOL 70 % SOLN
30.0000 mL | Status: AC
  Administered 2022-07-21 (×2): 30 mL via ORAL
  Filled 2022-07-21 (×2): qty 30

## 2022-07-21 MED ORDER — POLYETHYLENE GLYCOL 3350 17 G PO PACK
17.0000 g | PACK | Freq: Two times a day (BID) | ORAL | Status: DC
  Administered 2022-07-21 – 2022-07-22 (×3): 17 g via ORAL
  Filled 2022-07-21 (×4): qty 1

## 2022-07-21 MED ORDER — BISACODYL 5 MG PO TBEC
5.0000 mg | DELAYED_RELEASE_TABLET | Freq: Every day | ORAL | Status: DC | PRN
  Administered 2022-07-26: 5 mg via ORAL
  Filled 2022-07-21: qty 1

## 2022-07-21 MED ORDER — FLUCONAZOLE 150 MG PO TABS
150.0000 mg | ORAL_TABLET | Freq: Once | ORAL | Status: AC
Start: 2022-07-21 — End: 2022-07-21
  Administered 2022-07-21: 150 mg via ORAL
  Filled 2022-07-21 (×2): qty 1

## 2022-07-21 NOTE — Progress Notes (Signed)
[facility] Progress Note   Subjective:   Patient seen and examined at bedside. Has about 1 hour of PD left.  No BM yesterday. States she is feeling bloated and miserable. Denies CP and n/v/d.  Admits to SOB with "long conversations."  Objective Vitals:   07/20/22 1721 07/20/22 2126 07/21/22 0618 07/21/22 0929  BP: (!) 91/52 (!) 92/51 (!) 101/55 (!) 95/46  Pulse: 72 70 97 64  Resp: '18 18 18 18  '$ Temp:  97.6 F (36.4 C) 98.2 F (36.8 C) 98.3 F (36.8 C)  TempSrc:  Oral Oral Oral  SpO2: 95% 99%  95%  Weight:      Height:       Physical Exam General:well appearing female in NAD Heart:RRR, no mrg Lungs:CTAB, nml WOB on RA Abdomen:soft, NT, mildly distended Extremities:no LE edema Dialysis Access: PD catheter   Filed Weights   07/17/22 1355 07/18/22 0539 07/19/22 0504  Weight: 77.8 kg 80.1 kg 80.9 kg    Intake/Output Summary (Last 24 hours) at 07/21/2022 1010 Last data filed at 07/21/2022 0809 Gross per 24 hour  Intake 460 ml  Output 0 ml  Net 460 ml    Additional Objective Labs: Basic Metabolic Panel: Recent Labs  Lab 07/18/22 1703 07/19/22 0823 07/20/22 0936  NA 130* 131* 132*  K 3.0* 3.7 3.2*  CL 93* 94* 93*  CO2 '26 24 26  '$ GLUCOSE 120* 211* 161*  BUN 39* 35* 33*  CREATININE 6.91* 6.36* 6.58*  CALCIUM 9.3 9.4 9.8   CBC: Recent Labs  Lab 07/02/2022 0108 07/17/22 0618 07/18/22 1703 07/19/22 0823 07/20/22 0936  WBC 12.9* 11.6* 12.0* 12.0* 11.8*  HGB 10.9* 11.0* 10.7* 11.0* 11.4*  HCT 33.2* 32.7* 31.5* 33.9* 34.7*  MCV 96.2 93.4 93.8 96.6 94.8  PLT 143* 170 151 148* 177   Blood Culture    Component Value Date/Time   SDES FLUID PERITONEAL 07/14/2022 0502   SPECREQUEST NONE 07/14/2022 0502   CULT  07/07/2022 2302    NO GROWTH 5 DAYS Performed at [facility], [address] [place], [place] [address]    REPTSTATUS 07/14/2022 FINAL 07/14/2022 0502    Medications:  sodium chloride Stopped (07/14/22 0109)   sodium chloride  Stopped (07/15/22 2254)   sodium chloride Stopped (07/19/22 0400)   dialysis solution 1.5% low-MG/low-CA      Chlorhexidine Gluconate Cloth  6 each Topical Daily   escitalopram  5 mg Oral Daily   ezetimibe  10 mg Oral Daily   ferric citrate  420 mg Oral TID WC   gentamicin cream  1 Application Topical Daily   heparin injection (subcutaneous)  5,000 Units Subcutaneous Q8H   insulin aspart  0-6 Units Subcutaneous TID WC   levothyroxine  75 mcg Oral QAC breakfast   midodrine  10 mg Oral TID with meals   pantoprazole  40 mg Oral Daily   polyethylene glycol  17 g Oral Daily   senna  1 tablet Oral QHS   simvastatin  40 mg Oral QHS   sodium bicarbonate  650 mg Oral BID    Dialysis Orders: CCPD. Follows at [facility]. 7d/ wk, 75kg, 6 exchanges overnight, 2800cc , 1.5h dwell, no daybag - last Hb 12.9 on 8/2, last mircera 6/26 - last pth 1027, phos 9 - no esa or vdra   Assessment/Plan: 1. Hypotension - weaned off pressors. On midodrine. EF 60-65% on TEE. 2. Leukocytosis/Fever - etiology unclear. Now afebrile and WBC trending down. [facility] & Peritoneal culture w/NGTD.  Initial concern for endocarditis but TEE with no vegetations. On cefepime. Vanc d/c.  3. Hypokalemia - Last K 3.2.  Replete prn.  4. Hyponatremia - improved. Na 132.  5. Hypomagnesemia - Improved. Last Mg 2.2.  6. ESRD - on PD.  Continue nightly.  Using 1.5% bags. Can be completed at outpatient if discharge home. 7. Anemia of CKD- Hgb 11.4. No indication for ESA.  8. Secondary hyperparathyroidism - Ca ok. Phos elevated, continue binders.  9. Volume - Does not appear volume overloaded.  Continue to use 1.5% dialysate.  10. Nutrition - Renal diet w/fluid restrictions.  11. Moderate AAS/mild-mod AI/mod MR - to have OP follow w/cardio 12. B/l LE pain/chronic venous stasis - LE doppler negative for DVT 13. Hypothyroidism - on synthroid 14. Constipation - on miralax.  Ordered dulcolax today as well.   Jen Mow, PA-C [facility] 07/21/2022,10:10 AM  LOS: 8 days

## 2022-07-21 NOTE — Plan of Care (Signed)
?  Problem: Clinical Measurements: ?Goal: Will remain free from infection ?Outcome: Progressing ?  ?Problem: Clinical Measurements: ?Goal: Diagnostic test results will improve ?Outcome: Progressing ?  ?

## 2022-07-21 NOTE — Progress Notes (Signed)
PROGRESS NOTE    Erika Fry  OVZ:[id number] DOB: [birth year] DOA: 07/14/2022 PCP: [name], MD    Chief Complaint  Patient presents with   Weakness    Brief Narrative: HPI Patient is a 76 year old female with pertinent PMH of ESRD on peritoneal dialysis, DMT2, anemia, thrombocytopenia, HFpEF, pacemaker in place presents to [facility] ED on 8/12 with generalized weakness.   Patient states she has been noticing fatigue and generalized weakness for the past 2 to 3 days.  Has had decreased appetite and not drinking as much due to generalized abdominal tenderness.  She has been doing her peritoneal dialysis.  Denies fever, chest pain, N/V/D.  States she has stopped taking home midodrine for about a week. Came to [facility] ED on 8/12 for further work-up.   Upon arrival to [facility] ED, BP 82/54.  Other vitals stable and on room air.  Afebrile.  Patient given IV fluids.  Patient states she felt better after fluids but BP remained low.  CXR with no significant findings.  CT abdomen/pelvis with no acute abnormality.  WBC 27.5, Hgb 15.7, NA 131, creat 8.22, BUN 50, alk phos 178, AG 23, CO2 20.  COVID/flu negative.  LA 2.7.  BC x2 obtained and patient started on cefepime/Vanco.  PCCM consulted for ICU admission given hypotension.   Assessment & Plan:   Principal Problem:   Septic shock ([place]) Active Problems:   ESRD on dialysis ([place])   Vaginal pain  #1 septic shock -Questionable etiology. -Patient noted on presentation to be in septic shock and had presented with fatigue, generalized weakness noted to be hypotensive in the ED with a significant leukocytosis white count of 27.5. -Blood cultures ordered with no growth to date. -Patient required pressors of Levophed which was subsequently weaned off currently on midodrine. -Peritoneal cultures with no growth. -Fever curve trended down, leukocytosis trended down. -Initial concern for endocarditis, TEE done 07/07/2022 negative for any  vegetations. -Was on IV Vanco, IV cefepime, IV vancomycin discontinued. -Status post 7 days IV cefepime.   -Supportive care.    2.  Hypomagnesemia/hypokalemia/hyponatremia -Currently undergoing peritoneal dialysis per nephrology. -Potassium at 3.2 -Magnesium noted at 2.2.   -KDur 20 mEq p.o. x1. -Repeat labs in the AM.   -Electrolyte management per nephrology.  3.  Moderate AAS/mild to moderate AI/moderate MR -Cardiology following. -Was on heparin drip which has subsequently been discontinued. -Outpatient follow-up with cardiology.  4.  Vaginal pain -Continue current pain regimen. -PCCM started bedtime oral Valium as needed at a low dose. -Trial of Valium 1 mg p.o. x1 on 07/18/2022 with management of vaginal pain per patient.   -Outpatient follow-up.  5.  Chronic anemia secondary to ESRD -Patient likely on presentation with hemoconcentration. -Hemoglobin currently stable at 10.8. -Per nephrology.  6.  ESRD on CCPD -Currently on nightly PD. -Per nephrology.  7.  Endocarditis ruled out -2D echo done on presentation with concern for thrombus on pacemaker lead and ascending aorta. -Patient seen in consultation by cardiology, underwent TEE which was negative for vegetations. -IV therapeutic heparin discontinued. -Continue IV antibiotics.    8.  Bilateral chronic venous stasis -Lower extremity Dopplers negative for DVT.  9.  Hypothyroidism -Synthroid  -Outpatient follow-up.  10.  Vaginal candidiasis -Diflucan 150 mg p.o. x1. -Clotrimazole vaginal cream daily nightly.  11.  Constipation -Patient with complaints of constipation despite being on MiraLAX daily, Senokot-S at bedtime. -Increase MiraLAX to twice daily, Senokot-S to twice daily. -Patient refusing to suppository at this time. -Place on sorbitol  p.o. every 2 hours x2 doses.  DVT prophylaxis: Heparin Code Status: Full Family Communication: Updated patient.  No family at bedside. Disposition: Likely home with  home health hopefully in the next 1 to 2 days.  Patient refuses SNF.   Status is: Inpatient Remains inpatient appropriate because: Severity of illness   Consultants:  Cardiology: Dr. Einar Gip 07/29/2022 Nephrology: Dr.Schertz 07/14/2022  Procedures:  TEE: 07/21/2022 per Dr. Einar Gip 2D echo 07/15/2022  Significant Hospital Events: Including procedures, antibiotic start and stop dates in addition to other pertinent events   8/12: admitted to [facility] w/ hypotension 8/14 - TTE showing pacemaker lead and aortic vegetation/thrombus. Cardiology consulted  Antimicrobials:  IV cefepime 07/25/2022>>>> 07/20/2022 IV vancomycin 07/29/2022>>>> 07/14/2022   Subjective: Laying in bed.  Complain of constipation.  Still with complaints of chronic vaginal pain.  No chest pain.  No shortness of breath.  Stated has not had a bowel movement in 2 to 3 days, complaining of constipation.  Objective: Vitals:   07/20/22 1721 07/20/22 2126 07/21/22 0618 07/21/22 0929  BP: (!) 91/52 (!) 92/51 (!) 101/55 (!) 95/46  Pulse: 72 70 97 64  Resp: $Remo'18 18 18 18  'lSpOK$ Temp:  97.6 F (36.4 C) 98.2 F (36.8 C) 98.3 F (36.8 C)  TempSrc:  Oral Oral Oral  SpO2: 95% 99%  95%  Weight:      Height:        Intake/Output Summary (Last 24 hours) at 07/21/2022 1243 Last data filed at 07/21/2022 0809 Gross per 24 hour  Intake 460 ml  Output 0 ml  Net 460 ml    Filed Weights   07/17/22 1355 07/18/22 0539 07/19/22 0504  Weight: 77.8 kg 80.1 kg 80.9 kg    Examination:  General exam: NAD. Respiratory system: CTA B.  No wheezes, no crackles, no rhonchi.  Fair air movement.  Cardiovascular system: RRR with 3/6 SEM.  No JVD, no murmurs rubs or gallops.  No lower extremity edema. Gastrointestinal system: Abdomen soft, nontender, nondistended, positive bowel sounds.  No rebound.  No guarding.  Central nervous system: Alert and oriented. No focal neurological deficits. Extremities: Symmetric 5 x 5 power. Skin: No rashes, lesions or  ulcers Psychiatry: Judgement and insight appear normal. Mood & affect appropriate.     Data Reviewed: I have personally reviewed following labs and imaging studies  CBC: Recent Labs  Lab 07/17/22 0618 07/18/22 1703 07/19/22 0823 07/20/22 0936 07/21/22 0947  WBC 11.6* 12.0* 12.0* 11.8* 13.2*  HGB 11.0* 10.7* 11.0* 11.4* 10.8*  HCT 32.7* 31.5* 33.9* 34.7* 32.5*  MCV 93.4 93.8 96.6 94.8 96.7  PLT 170 151 148* 177 182     Basic Metabolic Panel: Recent Labs  Lab 07/15/22 0705 07/29/2022 0732 07/17/22 0618 07/18/22 1703 07/19/22 0823 07/20/22 0936 07/21/22 0947  NA 128* 129* 129* 130* 131* 132* 132*  K 3.3* 2.9* 2.9* 3.0* 3.7 3.2* 3.3*  CL 92* 94* 92* 93* 94* 93* 93*  CO2 16* 19* 21* $Remov'26 24 26 26  'XUrbHJ$ GLUCOSE 154* 115* 116* 120* 211* 161* 187*  BUN 48* 46* 47* 39* 35* 33* 33*  CREATININE 7.18* 6.91* 7.18* 6.91* 6.36* 6.58* 6.68*  CALCIUM 9.3 9.1 9.3 9.3 9.4 9.8 9.9  MG 1.7 1.6* 1.8 2.2 2.2  --   --      GFR: Estimated Creatinine Clearance: 7.3 mL/min (A) (by C-G formula based on SCr of 6.68 mg/dL (H)).  Liver Function Tests: No results for input(s): "AST", "ALT", "ALKPHOS", "BILITOT", "PROT", "ALBUMIN" in the last 168  hours.   CBG: Recent Labs  Lab 07/20/22 1128 07/20/22 1715 07/20/22 2126 07/21/22 0728 07/21/22 1139  GLUCAP 169* 106* 131* 137* 109*      Recent Results (from the past 240 hour(s))  SARS Coronavirus 2 by RT PCR (hospital order, performed in [facility] hospital lab) *cepheid single result test* Anterior Nasal Swab     Status: None   Collection Time: 07/27/2022  6:07 PM   Specimen: Anterior Nasal Swab  Result Value Ref Range Status   SARS Coronavirus 2 by RT PCR NEGATIVE NEGATIVE Final    Comment: (NOTE) SARS-CoV-2 target nucleic acids are NOT DETECTED.  The SARS-CoV-2 RNA is generally detectable in upper and lower respiratory specimens during the acute phase of infection. The lowest concentration of SARS-CoV-2 viral copies this assay can  detect is 250 copies / mL. A negative result does not preclude SARS-CoV-2 infection and should not be used as the sole basis for treatment or other patient management decisions.  A negative result may occur with improper specimen collection / handling, submission of specimen other than nasopharyngeal swab, presence of viral mutation(s) within the areas targeted by this assay, and inadequate number of viral copies (<250 copies / mL). A negative result must be combined with clinical observations, patient history, and epidemiological information.  Fact Sheet for Patients:   https://www.patel.info/  Fact Sheet for Healthcare Providers: https://hall.com/  This test is not yet approved or  cleared by the [place] FDA and has been authorized for detection and/or diagnosis of SARS-CoV-2 by FDA under an Emergency Use Authorization (EUA).  This EUA will remain in effect (meaning this test can be used) for the duration of the COVID-19 declaration under Section 564(b)(1) of the Act, 21 U.S.C. section 360bbb-3(b)(1), unless the authorization is terminated or revoked sooner.  Performed at [facility], [place] [address], [place], [place] [address]   Blood culture (routine x 2)     Status: None   Collection Time: 07/02/2022  6:09 PM   Specimen: BLOOD LEFT FOREARM  Result Value Ref Range Status   Specimen Description BLOOD LEFT FOREARM  Final   Special Requests   Final    BOTTLES DRAWN AEROBIC AND ANAEROBIC Blood Culture results may not be optimal due to an inadequate volume of blood received in culture bottles   Culture   Final    NO GROWTH 5 DAYS Performed at [facility], [place] [address] [place], [place] [address]    Report Status 07/18/2022 FINAL  Final  Blood culture (routine x 2)     Status: None   Collection Time: 07/08/2022  6:14 PM   Specimen: BLOOD  Result Value Ref Range Status   Specimen Description BLOOD LEFT ANTECUBITAL   Final   Special Requests   Final    BOTTLES DRAWN AEROBIC AND ANAEROBIC Blood Culture results may not be optimal due to an inadequate volume of blood received in culture bottles   Culture   Final    NO GROWTH 5 DAYS Performed at [facility], [place] [address], [place], [place] [address]    Report Status 07/18/2022 FINAL  Final  Culture, body fluid w Gram Stain-bottle     Status: None   Collection Time: 07/12/2022 11:02 PM   Specimen: Fluid  Result Value Ref Range Status   Specimen Description FLUID PERITONEAL  Final   Special Requests NONE  Final   Culture   Final    NO GROWTH 5 DAYS Performed at  [facility], [place] [address], [place], [place] [address]    Report Status 07/19/2022 FINAL  Final  MRSA Next Gen by PCR, Nasal     Status: None   Collection Time: 07/02/2022 11:52 PM   Specimen: Nasal Mucosa; Nasal Swab  Result Value Ref Range Status   MRSA by PCR Next Gen NOT DETECTED NOT DETECTED Final    Comment: (NOTE) The GeneXpert MRSA Assay (FDA approved for NASAL specimens only), is one component of a comprehensive MRSA colonization surveillance program. It is not intended to diagnose MRSA infection nor to guide or monitor treatment for MRSA infections. Test performance is not FDA approved in patients less than 36 years old. Performed at [facility], [place] [address], [place], [place] [address]   Gram stain     Status: None   Collection Time: 07/14/22  5:02 AM   Specimen: Fluid  Result Value Ref Range Status   Specimen Description FLUID PERITONEAL  Final   Special Requests NONE  Final   Gram Stain   Final    NO ORGANISMS SEEN NO WBC SEEN Performed at [facility], [place] [address] [place], [place] [address]    Report Status 07/14/2022 FINAL  Final         Radiology Studies: No results found.      Scheduled Meds:  Chlorhexidine Gluconate Cloth  6 each Topical Daily   escitalopram  5 mg Oral Daily   ezetimibe  10 mg Oral Daily   ferric  citrate  420 mg Oral TID WC   gentamicin cream  1 Application Topical Daily   heparin injection (subcutaneous)  5,000 Units Subcutaneous Q8H   insulin aspart  0-6 Units Subcutaneous TID WC   levothyroxine  75 mcg Oral QAC breakfast   midodrine  10 mg Oral TID with meals   pantoprazole  40 mg Oral Daily   polyethylene glycol  17 g Oral Daily   senna  1 tablet Oral QHS   simvastatin  40 mg Oral QHS   sodium bicarbonate  650 mg Oral BID   Continuous Infusions:  sodium chloride Stopped (07/14/22 0109)   sodium chloride Stopped (07/15/22 2254)   sodium chloride Stopped (07/19/22 0400)   dialysis solution 1.5% low-MG/low-CA       LOS: 8 days    Time spent: 35 minutes    Irine Seal, MD [facility]   To contact the attending provider between 7A-7P or the covering provider during after hours 7P-7A, please log into the web site [email or web address] and access using universal [facility] password for that web site. If you do not have the password, please call the hospital operator.  07/21/2022, 12:43 PM

## 2022-07-22 DIAGNOSIS — E876 Hypokalemia: Secondary | ICD-10-CM | POA: Diagnosis not present

## 2022-07-22 DIAGNOSIS — A419 Sepsis, unspecified organism: Secondary | ICD-10-CM | POA: Diagnosis not present

## 2022-07-22 DIAGNOSIS — N186 End stage renal disease: Secondary | ICD-10-CM | POA: Diagnosis not present

## 2022-07-22 LAB — CBC
HCT: 34.8 % — ABNORMAL LOW (ref 36.0–46.0)
Hemoglobin: 11.1 g/dL — ABNORMAL LOW (ref 12.0–15.0)
MCH: 31.3 pg (ref 26.0–34.0)
MCHC: 31.9 g/dL (ref 30.0–36.0)
MCV: 98 fL (ref 80.0–100.0)
Platelets: 189 10*3/uL (ref 150–400)
RBC: 3.55 MIL/uL — ABNORMAL LOW (ref 3.87–5.11)
RDW: 14.2 % (ref 11.5–15.5)
WBC: 12.9 10*3/uL — ABNORMAL HIGH (ref 4.0–10.5)
nRBC: 0 % (ref 0.0–0.2)

## 2022-07-22 LAB — RENAL FUNCTION PANEL
Albumin: 2 g/dL — ABNORMAL LOW (ref 3.5–5.0)
Anion gap: 14 (ref 5–15)
BUN: 35 mg/dL — ABNORMAL HIGH (ref 8–23)
CO2: 23 mmol/L (ref 22–32)
Calcium: 9.6 mg/dL (ref 8.9–10.3)
Chloride: 94 mmol/L — ABNORMAL LOW (ref 98–111)
Creatinine, Ser: 7.1 mg/dL — ABNORMAL HIGH (ref 0.44–1.00)
GFR, Estimated: 5 mL/min — ABNORMAL LOW (ref >60)
Glucose, Bld: 131 mg/dL — ABNORMAL HIGH (ref 70–99)
Phosphorus: 4.5 mg/dL (ref 2.5–4.6)
Potassium: 3.9 mmol/L (ref 3.5–5.1)
Sodium: 131 mmol/L — ABNORMAL LOW (ref 135–145)

## 2022-07-22 LAB — GLUCOSE, CAPILLARY
Glucose-Capillary: 188 mg/dL — ABNORMAL HIGH (ref 70–99)
Glucose-Capillary: 229 mg/dL — ABNORMAL HIGH (ref 70–99)
Glucose-Capillary: 114 mg/dL — ABNORMAL HIGH (ref 70–99)
Glucose-Capillary: 97 mg/dL (ref 70–99)

## 2022-07-22 MED ORDER — BISACODYL 10 MG RE SUPP
10.0000 mg | Freq: Once | RECTAL | Status: AC
Start: 2022-07-22 — End: 2022-07-22
  Administered 2022-07-22: 10 mg via RECTAL
  Filled 2022-07-22: qty 1

## 2022-07-22 MED ORDER — LACTULOSE 10 GM/15ML PO SOLN
30.0000 g | Freq: Two times a day (BID) | ORAL | Status: DC
  Administered 2022-07-22 – 2022-07-23 (×3): 30 g via ORAL
  Filled 2022-07-22 (×3): qty 45

## 2022-07-22 MED ORDER — PROCHLORPERAZINE EDISYLATE 10 MG/2ML IJ SOLN
10.0000 mg | Freq: Once | INTRAMUSCULAR | Status: AC
  Administered 2022-07-22: 10 mg via INTRAVENOUS
  Filled 2022-07-22: qty 2

## 2022-07-22 MED ORDER — PROCHLORPERAZINE EDISYLATE 10 MG/2ML IJ SOLN
10.0000 mg | Freq: Four times a day (QID) | INTRAMUSCULAR | Status: DC | PRN
  Administered 2022-07-23 – 2022-07-24 (×3): 10 mg via INTRAVENOUS
  Filled 2022-07-22 (×3): qty 2

## 2022-07-22 NOTE — NC FL2 (Signed)
[place] MEDICAID FL2 LEVEL OF CARE SCREENING TOOL     IDENTIFICATION  Patient Name: Erika Fry Birthdate: 10-Dec-1945 Sex: female Admission Date (Current Location): 07/06/2022  [facility] and [place] Number:  Herbalist and Address:  [facility], [place] [address], [place], [place] [address]      Provider Number: [id number]  Attending Physician Name and Address:  Eugenie Filler, MD  Relative Name and Phone Number:  Karow,Chris Pandora Leiter)   [phone]    Current Level of Care: Hospital Recommended Level of Care: [place] Prior Approval Number:    Date Approved/Denied:   PASRR Number: [id number]  Discharge Plan: SNF    Current Diagnoses: Patient Active Problem List   Diagnosis Date Noted   Vaginal pain    High anion gap metabolic acidosis    Pain of upper abdomen    Septic shock ([place]) 08/06/2021   Hypotension 08/06/2021   Changes in vision 06/21/2021   Temporal arteritis syndrome ([place]) 06/21/2021   Acidosis 10/06/2020   Disorder of lipoprotein metabolism, unspecified 10/06/2020   Liver disease, unspecified 10/06/2020   Moderate protein-calorie malnutrition ([place]) 10/06/2020   Other abnormal findings in urine 10/06/2020   Other dietary vitamin B12 deficiency anemia 10/06/2020   Other disorders of bilirubin metabolism 10/06/2020   Other disorders of electrolyte and fluid balance, not elsewhere classified 10/06/2020   Other disorders resulting from impaired renal tubular function 10/06/2020   Other long term (current) drug therapy 10/06/2020   Allergy, unspecified, initial encounter 08/21/2020   Anaphylactic shock, unspecified, initial encounter 08/21/2020   Nausea 05/18/2020   Acute respiratory disease due to COVID-19 virus 12/25/2019   Encounter for care of pacemaker 09/07/2019   Mild protein-calorie malnutrition ([place]) 07/01/2019   Coagulation defect, unspecified ([place]) 06/29/2019   Secondary  hyperparathyroidism of renal origin ([place]) 06/29/2019   ESRD on dialysis ([place])    Pain in both lower extremities    Hypomagnesemia 06/11/2019   DNR (do not resuscitate) discussion    Cystitis    Acute on chronic diastolic CHF (congestive heart failure) ([place]) 06/10/2019   Chronic diastolic CHF (congestive heart failure) ([place]) 06/09/2019   Cellulitis of lower extremity 06/09/2019   Bladder pain    Hyponatremia    Acute on chronic anemia    Diabetes mellitus type 2 in obese ([place])    Chronic kidney disease    Drug induced constipation    Neurogenic bladder    Weakness generalized 04/21/2019   Acquired hydronephrosis with ureteropelvic junction (UPJ) obstruction    Pain in joint involving pelvic region and thigh    Dysuria    Palliative care by specialist    AKI (acute kidney injury) ([place]) 04/10/2019   Hydronephrosis 04/09/2019   Interstitial cystitis 04/09/2019   Bilateral leg edema 02/25/2019   Chronic heart failure with preserved ejection fraction ([place]) 02/24/2019   Pacemaker Medtronic Azure XT DR MRI Dual Chamber Pacemaker 03/25/2018 02/24/2019   ARF (acute renal failure) ([place]) 02/10/2019   Peripheral edema    Fever 01/17/2019   Acute lower UTI 01/17/2019   Pressure injury of skin 01/17/2019   Gross hematuria 01/14/2019   Fluid retention    Generalized edema    Hypokalemia    Acute right-sided low back pain with right-sided sciatica    Acute blood loss anemia    Anemia of chronic disease    Thrombocytopenia (HCC)    Diabetes mellitus ([place])    Morbid obesity (HCC)    Hypoalbuminemia  due to protein-calorie malnutrition ([place])    Occult blood in stools    Gastric polyp    Lumbar discitis 05/17/2018   Blood loss anemia 05/12/2018   Hyperlipidemia associated with type 2 diabetes mellitus ([place]) 05/10/2018   Yeast UTI 04/30/2018   Chronic kidney disease (CKD), stage IV (severe) (HCC)    Diet-controlled diabetes mellitus (HCC)    GERD (gastroesophageal reflux disease)    Gout     High cholesterol    Hypothyroidism    Iron deficiency anemia    AV block, Mobitz 2 03/25/2018   Mobitz type 2 second degree AV block 03/20/2018   Exertional dyspnea 03/19/2018   Acute pain of left knee 07/16/2016   Right wrist pain 07/16/2016    Orientation RESPIRATION BLADDER Height & Weight     Self, Time, Situation, Place  Normal Continent Weight: 178 lb 5.6 oz (80.9 kg) Height:  '5\' 5"'$  (165.1 cm)  BEHAVIORAL SYMPTOMS/MOOD NEUROLOGICAL BOWEL NUTRITION STATUS      Incontinent Diet (see d/c summary)  AMBULATORY STATUS COMMUNICATION OF NEEDS Skin   Total Care   Normal                       Personal Care Assistance Level of Assistance  Feeding, Dressing, Bathing Bathing Assistance: Limited assistance Feeding assistance: Limited assistance Dressing Assistance: Maximum assistance     Functional Limitations Info  Hearing, Speech, Sight Sight Info: Impaired Hearing Info: Adequate Speech Info: Adequate    SPECIAL CARE FACTORS FREQUENCY  PT (By licensed PT), OT (By licensed OT)     PT Frequency: 5x/ week OT Frequency: 5x/ week            Contractures Contractures Info: Not present    Additional Factors Info  Code Status, Psychotropic, Allergies, Insulin Sliding Scale Code Status Info: Full Allergies Info: Lioresal (Baclofen)   Tape   Other   Codeine   Robitussin Cold Cough+ Chest (Dextromethorphan-guaifenesin) Psychotropic Info: escitapram '5mg'$  Insulin Sliding Scale Info: see d/c med list       Current Medications (07/22/2022):  This is the current hospital active medication list Current Facility-Administered Medications  Medication Dose Route Frequency Provider Last Rate Last Admin   0.9 %  sodium chloride infusion  250 mL Intravenous Continuous Adrian Prows, MD   Stopped at 07/14/22 0109   0.9 %  sodium chloride infusion   Intra-arterial PRN Adrian Prows, MD   Stopped at 07/15/22 2254   0.9 %  sodium chloride infusion   Intravenous PRN Eugenie Filler, MD    Stopped at 07/19/22 0400   acetaminophen (TYLENOL) tablet 650 mg  650 mg Oral Q4H PRN Adrian Prows, MD   650 mg at 07/18/22 0558   albuterol (PROVENTIL) (2.5 MG/3ML) 0.083% nebulizer solution 3 mL  3 mL Inhalation Q4H PRN Adrian Prows, MD       bisacodyl (DULCOLAX) EC tablet 5 mg  5 mg Oral Daily PRN Penninger, Ria Comment, PA       Chlorhexidine Gluconate Cloth 2 % PADS 6 each  6 each Topical Daily Adrian Prows, MD   6 each at 07/21/22 0835   clotrimazole (GYNE-LOTRIMIN) vaginal cream 1 Applicatorful  1 Applicatorful Vaginal QHS Eugenie Filler, MD   1 Applicatorful at 91/63/84 2222   dialysis solution 1.5% low-MG/low-CA dianeal solution   Intraperitoneal Q24H Adrian Prows, MD   6,000 mL at 07/18/22 2003   diazepam (VALIUM) tablet 1 mg  1 mg Oral QHS PRN Adrian Prows, MD  escitalopram (LEXAPRO) tablet 5 mg  5 mg Oral Daily Adrian Prows, MD   5 mg at 07/22/22 1029   ezetimibe (ZETIA) tablet 10 mg  10 mg Oral Daily Adrian Prows, MD   10 mg at 07/22/22 1029   ferric citrate (AURYXIA) tablet 420 mg  420 mg Oral TID WC Adrian Prows, MD   420 mg at 07/22/22 1201   gentamicin cream (GARAMYCIN) 0.1 % 1 Application  1 Application Topical Daily Adrian Prows, MD   1 Application at 66/44/03 0840   heparin injection 5,000 Units  5,000 Units Subcutaneous Q8H Adrian Prows, MD   5,000 Units at 07/22/22 1416   HYDROcodone-acetaminophen (NORCO/VICODIN) 5-325 MG per tablet 1 tablet  1 tablet Oral Q6H PRN Adrian Prows, MD   1 tablet at 07/22/22 1030   insulin aspart (novoLOG) injection 0-6 Units  0-6 Units Subcutaneous TID WC Adrian Prows, MD   2 Units at 07/22/22 1208   lactulose ([place]) 10 GM/15ML solution 30 g  30 g Oral BID Eugenie Filler, MD   30 g at 07/22/22 1030   levothyroxine (SYNTHROID) tablet 75 mcg  75 mcg Oral QAC breakfast Adrian Prows, MD   75 mcg at 07/22/22 4742   lip balm (CARMEX) ointment   Topical PRN Adrian Prows, MD       midodrine (PROAMATINE) tablet 10 mg  10 mg Oral TID with meals Adrian Prows, MD   10 mg at  07/22/22 1202   ondansetron (ZOFRAN) injection 4 mg  4 mg Intravenous Q6H PRN Eugenie Filler, MD   4 mg at 07/22/22 0630   Oral care mouth rinse  15 mL Mouth Rinse PRN Adrian Prows, MD       oxyCODONE (Oxy IR/ROXICODONE) immediate release tablet 5 mg  5 mg Oral Q6H PRN Delene Ruffini, MD   5 mg at 07/22/22 1201   pantoprazole (PROTONIX) EC tablet 40 mg  40 mg Oral Daily Adrian Prows, MD   40 mg at 07/22/22 1029   polyethylene glycol (MIRALAX / GLYCOLAX) packet 17 g  17 g Oral BID Eugenie Filler, MD   17 g at 07/22/22 1030   senna (SENOKOT) tablet 8.6 mg  1 tablet Oral BID Eugenie Filler, MD   8.6 mg at 07/22/22 1029   simvastatin (ZOCOR) tablet 40 mg  40 mg Oral QHS Adrian Prows, MD   40 mg at 07/21/22 2224   sodium bicarbonate tablet 650 mg  650 mg Oral BID Adrian Prows, MD   650 mg at 07/22/22 1030     Discharge Medications: Please see discharge summary for a list of discharge medications.  Relevant Imaging Results:  Relevant Lab Results:   Additional Information SSN# [id number]; [facility] COVID-19 Vaccine 09/03/2020 , 08/20/2020. Requires transportation to Dialysis 3x/week (HD info pending)  Abdulraheem Pineo F Hoy Fallert, LCSWA

## 2022-07-22 NOTE — Progress Notes (Signed)
Patient's Peritoneal dialysis cycler beeped multiple times saying there's occlusion. Tubing checked for kinks but none. Noted some fibrin on drainage bag. Attempted to call assist but voice machine says they're closed at 03:30. Called dialysis charge RN x3 but no answer. Called Schertz,MD and explained situation. Per MD,just stop treatment for now. After MD call,RN went to take patient off bedpan. After she moved her bowel, cycler started working again. 0700 Dr. Jonnie Finner was on the floor,updated of patient's working cycler.

## 2022-07-22 NOTE — TOC Progression Note (Addendum)
Transition of Care [facility]) - Progression Note    Patient Details  Name: Erika Fry MRN: [id number] Date of Birth: 07-Aug-1946  Transition of Care [facility]) CM/SW Contact  Jacalyn Lefevre Edson Snowball, RN Phone Number: 07/22/2022, 11:29 AM  Clinical Narrative:     Received a secure chat from nurse , that patient's daughter in law Erika Fry [phone] is concerned with patient going home alone. Family work and cannot provide assistance.   Patient adamant that she is going home with home health and not SNF. Aware home health only comes a couple times a week for about a hour at a time.   Erika Fry understands patient is alert and oriented and TOC team cannot make her go to a SNF.   NCM asked PT to call Erika Fry when PT works with her today , to provide update on how she is doing .   [place] arranged for home health .   [facility]. In application process, will need to be approved and staffed   Anticipated discharge date tomorrow, 07/23/22 Erika Fry aware     Erika Fry called NCM back. She talked with her mother in law and says she is willing to go to SNF if she could go to [facility] or [facility] . NCM messaged SW.   Expected Discharge Plan: [place] Barriers to Discharge: Continued Medical Work up  Expected Discharge Plan and Services Expected Discharge Plan: [place]   Discharge Planning Services: CM Consult Post Acute Care Choice: [place] arrangements for the past 2 months: Single Family Home                 DME Arranged: N/A DME Agency: NA       HH Arranged: PT HH Agency: [place] Date [place]: 07/17/22 Time [place]: 9470 Representative spoke with at [place]: [place] ([place]) Interventions    Readmission Risk Interventions    08/13/2021    2:56 PM 12/29/2019    9:29 AM  Readmission Risk Prevention Plan  Transportation  Screening Complete Complete  Medication Review Press photographer) Complete Complete  PCP or Specialist appointment within 3-5 days of discharge Complete   HRI or [place] Complete Complete  SW Recovery Care/Counseling Consult Complete   Palliative Care Screening Not Applicable Not [place] Patient Refused Not Applicable

## 2022-07-22 NOTE — Progress Notes (Signed)
Physical Therapy Treatment Patient Details Name: Erika Fry MRN: [id number] DOB: 1946-01-23 Today's Date: 07/22/2022   History of Present Illness Patient is a 76 year old female [facility] ED on 8/12 with generalized weakness.  Pt with septic shock, hypotension and questionable vegetation on pacer lead in aorta.  Pt for TEE 8/15.  PMH of ESRD on peritoneal dialysis, DMT2, anemia, thrombocytopenia, HFpEF, pacemaker    PT Comments    Patient progressing slowly towards PT goals. Pt sleepy upon PT arrival but able to wake up with movement and stimulation. Session focused on standing and functional transfers. Requires Mod A of 2 to stand from EOB with cues for technique. Max A of 2 for SPT to chair due to weakness. Unable to initiate steps today. Also needs assist for sitting balance due to right lateral lean and poor trunk control. Pt reports she has not been OOB since admission likely contributing to overall deconditioning and weakness. Pt is not safe to be home alone at this time and needs 2 person assist for transfers OOB. Pt agreeable to go to SNF- requesting Erika Fry. Recommend stedy with nursing to transfer pt back to bed. Will follow.    Recommendations for follow up therapy are one component of a multi-disciplinary discharge planning process, led by the attending physician.  Recommendations may be updated based on patient status, additional functional criteria and insurance authorization.  Follow Up Recommendations  Skilled nursing-short term rehab (<3 hours/day) Can patient physically be transported by private vehicle: No   Assistance Recommended at Discharge Frequent or constant Supervision/Assistance  Patient can return home with the following Assistance with cooking/housework;Assist for transportation;Help with stairs or ramp for entrance;Two people to help with walking and/or transfers;A lot of help with bathing/dressing/bathroom   Equipment Recommendations  None  recommended by PT    Recommendations for Other Services       Precautions / Restrictions Precautions Precautions: Fall Precaution Comments: PD Restrictions Weight Bearing Restrictions: No     Mobility  Bed Mobility Overal bed mobility: Needs Assistance Bed Mobility: Supine to Sit     Supine to sit: Mod assist, +2 for physical assistance, HOB elevated     General bed mobility comments: Assist with LEs, trunk and scooting bottom to EOB, right lateral lean.    Transfers Overall transfer level: Needs assistance Equipment used: Rolling walker (2 wheels), 2 person hand held assist Transfers: Sit to/from Stand, Bed to chair/wheelchair/BSC Sit to Stand: Mod assist, +2 physical assistance Stand pivot transfers: Max assist, +2 physical assistance         General transfer comment: Heavy Mod A of 2 to stand from EOB x1 with increased time/effort, cues for upright as pt flexed at hips/trunk, Stood from EOB x2, once with 2 person assist instead of RW. Able to SPT with Max A of 2 to get to chair.    Ambulation/Gait               General Gait Details: Unable   Marine scientist [place] (Stroke Patients Only)       Balance Overall balance assessment: Needs assistance Sitting-balance support: Feet supported, No upper extremity supported Sitting balance-Leahy Scale: Poor Sitting balance - Comments: Requires Min-Mod A for sitting balance due to right lateral lean   Standing balance support: During functional activity, Reliant on assistive device for balance, Bilateral upper extremity supported Standing balance-Leahy Scale: Poor Standing balance comment: Poor  standing tolerance despite external support of 2 and Rw                            Cognition Arousal/Alertness: Lethargic Behavior During Therapy: Flat affect Overall Cognitive Status: No family/caregiver present to determine baseline cognitive functioning                                  General Comments: Pt with poor awareness of deficits and how they will impact her abilities to function at home independently. "I am so weak."        Exercises      General Comments        Pertinent Vitals/Pain Pain Assessment Pain Assessment: Faces Faces Pain Scale: Hurts little more Pain Location: LEs with movement Pain Descriptors / Indicators: Grimacing, Guarding, Sore Pain Intervention(s): Monitored during session, Repositioned, Limited activity within patient's tolerance    Home Living                          Prior Function            PT Goals (current goals can now be found in the care plan section) Progress towards PT goals: Progressing toward goals (slowly)    Frequency    Min 3X/week      PT Plan Discharge plan needs to be updated    Co-evaluation              AM-PAC PT "6 Clicks" Mobility   Outcome Measure  Help needed turning from your back to your side while in a flat bed without using bedrails?: A Little Help needed moving from lying on your back to sitting on the side of a flat bed without using bedrails?: A Lot Help needed moving to and from a bed to a chair (including a wheelchair)?: Total Help needed standing up from a chair using your arms (e.g., wheelchair or bedside chair)?: Total Help needed to walk in hospital room?: Total Help needed climbing 3-5 steps with a railing? : Total 6 Click Score: 9    End of Session Equipment Utilized During Treatment: Gait belt Activity Tolerance: Patient limited by fatigue;Patient limited by pain Patient left: in chair;with call bell/phone within reach;with chair alarm set Nurse Communication: Mobility status;Other (comment) (stedy) PT Visit Diagnosis: Muscle weakness (generalized) (M62.81);Difficulty in walking, not elsewhere classified (R26.2)     Time: 3818-2993 PT Time Calculation (min) (ACUTE ONLY): 25 min  Charges:  $Therapeutic  Activity: 23-37 mins                     Marisa Severin, PT, DPT Acute Rehabilitation Services Secure chat preferred Office [phone]      Marguarite Arbour A Arrick Dutton 07/22/2022, 1:50 PM

## 2022-07-22 NOTE — Progress Notes (Signed)
Case discussed with CSW. Pt is for possible snf placement. Advised nephrologist and renal PA of this possibility. If pt goes to snf, PD will need to be changed to HD. Will assist as needed.   Melven Sartorius Renal Navigator [phone]

## 2022-07-22 NOTE — TOC Progression Note (Signed)
Transition of Care [facility]) - Initial/Assessment Note    Patient Details  Name: Erika Fry MRN: [id number] Date of Birth: [birth year]  Transition of [facility]) CM/SW Contact:    Milinda Antis, [place] Phone Number: 07/22/2022, 12:01 PM  Clinical Narrative:                 CSW spoke with the patient's daughter in law, Erika Fry, after receiving notification that the patient is willing to go to SNF if patient can get into 2 specific facilities.  CSW explained that the patient would have to be switched from PD to HD in order to go to a SNF and that the facilities that the patient is requesting will be unable to accept the patient.   TOC will continue to follow.   Expected Discharge Plan: [place] Barriers to Discharge: Continued Medical Work up   Patient Goals and CMS Choice Patient states their goals for this hospitalization and ongoing recovery are:: To return home CMS Medicare.gov Compare Post Acute Care list provided to:: Patient Choice offered to / list presented to : Patient  Expected Discharge Plan and Services Expected Discharge Plan: [place]   Discharge Planning Services: CM Consult Post Acute Care Choice: [place] arrangements for the past 2 months: Single Family Home                 DME Arranged: N/A DME Agency: NA       HH Arranged: PT HH Agency: [place] Date [place]: 07/17/22 Time [place]: 35 Representative spoke with at [place]: Claiborne Billings  Prior Living Arrangements/Services Living arrangements for the past 2 months: [place] with:: Self Patient language and need for interpreter reviewed:: Yes Do you feel safe going back to the place where you live?: Yes      Need for Family Participation in Patient Care: Yes (Comment) Care giver support system in place?: Yes (comment) Current home services: DME Criminal Activity/Legal Involvement Pertinent to Current  Situation/Hospitalization: No - Comment as needed  Activities of Daily Living Home Assistive Devices/Equipment: Hospital bed ADL Screening (condition at time of admission) Patient's cognitive ability adequate to safely complete daily activities?: Yes Is the patient deaf or have difficulty hearing?: No Does the patient have difficulty seeing, even when wearing glasses/contacts?: No Does the patient have difficulty concentrating, remembering, or making decisions?: No Patient able to express need for assistance with ADLs?: Yes Does the patient have difficulty dressing or bathing?: Yes Independently performs ADLs?: Yes (appropriate for developmental age) Does the patient have difficulty walking or climbing stairs?: Yes Weakness of Legs: Both Weakness of Arms/Hands: Both  Permission Sought/Granted Permission sought to share information with : Case Manager, Family Supports, Chartered certified accountant granted to share information with : Yes, Verbal Permission Granted              Emotional Assessment Appearance:: Appears stated age Attitude/Demeanor/Rapport: Engaged, Gracious Affect (typically observed): Accepting, Appropriate, Calm, Hopeful, Pleasant Orientation: : Oriented to Self, Oriented to Place, Oriented to  Time, Oriented to Situation Alcohol / Substance Use: Not Applicable Psych Involvement: No (comment)  Admission diagnosis:  Weakness [R53.1] Septic shock (HCC) [A41.9, R65.21] Patient Active Problem List   Diagnosis Date Noted   Vaginal pain    High anion gap metabolic acidosis    Pain of upper abdomen    Septic shock ([place]) 08/06/2021   Hypotension 08/06/2021   Changes in vision  06/21/2021   Temporal arteritis syndrome ([place]) 06/21/2021   Acidosis 10/06/2020   Disorder of lipoprotein metabolism, unspecified 10/06/2020   Liver disease, unspecified 10/06/2020   Moderate protein-calorie malnutrition ([place]) 10/06/2020   Other abnormal findings in urine  10/06/2020   Other dietary vitamin B12 deficiency anemia 10/06/2020   Other disorders of bilirubin metabolism 10/06/2020   Other disorders of electrolyte and fluid balance, not elsewhere classified 10/06/2020   Other disorders resulting from impaired renal tubular function 10/06/2020   Other long term (current) drug therapy 10/06/2020   Allergy, unspecified, initial encounter 08/21/2020   Anaphylactic shock, unspecified, initial encounter 08/21/2020   Nausea 05/18/2020   Acute respiratory disease due to COVID-19 virus 12/25/2019   Encounter for care of pacemaker 09/07/2019   Mild protein-calorie malnutrition ([place]) 07/01/2019   Coagulation defect, unspecified ([place]) 06/29/2019   Secondary hyperparathyroidism of renal origin ([place]) 06/29/2019   ESRD on dialysis ([place])    Pain in both lower extremities    Hypomagnesemia 06/11/2019   DNR (do not resuscitate) discussion    Cystitis    Acute on chronic diastolic CHF (congestive heart failure) ([place]) 06/10/2019   Chronic diastolic CHF (congestive heart failure) ([place]) 06/09/2019   Cellulitis of lower extremity 06/09/2019   Bladder pain    Hyponatremia    Acute on chronic anemia    Diabetes mellitus type 2 in obese (HCC)    Chronic kidney disease    Drug induced constipation    Neurogenic bladder    Weakness generalized 04/21/2019   Acquired hydronephrosis with ureteropelvic junction (UPJ) obstruction    Pain in joint involving pelvic region and thigh    Dysuria    Palliative care by specialist    AKI (acute kidney injury) ([place]) 04/10/2019   Hydronephrosis 04/09/2019   Interstitial cystitis 04/09/2019   Bilateral leg edema 02/25/2019   Chronic heart failure with preserved ejection fraction ([place]) 02/24/2019   Pacemaker Medtronic Azure XT DR MRI Dual Chamber Pacemaker 03/25/2018 02/24/2019   ARF (acute renal failure) ([place]) 02/10/2019   Peripheral edema    Fever 01/17/2019   Acute lower UTI 01/17/2019   Pressure injury of skin 01/17/2019    Gross hematuria 01/14/2019   Fluid retention    Generalized edema    Hypokalemia    Acute right-sided low back pain with right-sided sciatica    Acute blood loss anemia    Anemia of chronic disease    Thrombocytopenia (HCC)    Diabetes mellitus (HCC)    Morbid obesity (HCC)    Hypoalbuminemia due to protein-calorie malnutrition (HCC)    Occult blood in stools    Gastric polyp    Lumbar discitis 05/17/2018   Blood loss anemia 05/12/2018   Hyperlipidemia associated with type 2 diabetes mellitus ([place]) 05/10/2018   Yeast UTI 04/30/2018   Chronic kidney disease (CKD), stage IV (severe) (HCC)    Diet-controlled diabetes mellitus (HCC)    GERD (gastroesophageal reflux disease)    Gout    High cholesterol    Hypothyroidism    Iron deficiency anemia    AV block, Mobitz 2 03/25/2018   Mobitz type 2 second degree AV block 03/20/2018   Exertional dyspnea 03/19/2018   Acute pain of left knee 07/16/2016   Right wrist pain 07/16/2016   PCP:  [name], MD Pharmacy:  No Pharmacies Listed    Social Determinants of Health (SDOH) Interventions    Readmission Risk Interventions    08/13/2021    2:56 PM 12/29/2019    9:29  AM  Readmission Risk Prevention Plan  Transportation Screening Complete Complete  Medication Review Press photographer) Complete Complete  PCP or Specialist appointment within 3-5 days of discharge Complete   HRI or [place] Complete Complete  SW Recovery Care/Counseling Consult Complete   Palliative Care Screening Not Applicable Not [place] Patient Refused Not Applicable

## 2022-07-22 NOTE — Progress Notes (Signed)
[facility] Progress Note   Subjective:   PD machine had occlusion overnight but reportedly started working again on its own. Denies abdominal pain, N/V/D. No SOB or CP. Asking me for pain medicine, reports chronic vaginal pain, says "they already looked at it."  Objective Vitals:   07/21/22 0929 07/21/22 1650 07/21/22 2008 07/22/22 0435  BP: (!) 95/46 (!) 85/48 (!) 99/58 (!) 95/56  Pulse: 64 75 67 80  Resp: '18 18 17 18  '$ Temp: 98.3 F (36.8 C) 98.3 F (36.8 C) 98.1 F (36.7 C) 98 F (36.7 C)  TempSrc: Oral  Oral Oral  SpO2: 95% 95% 96% 98%  Weight:      Height:       Physical Exam General: Slightly uncomfortable appearing female Heart: RRR, no murmurs, rubs or gallops Lungs: CTA bilaterally without wheezing, rhonchi or rales Abdomen: Soft, no TTP, +BS.  PD cath in lower abdomen without bleeding or drainage Extremities: No edema b/l lower extremities  Additional Objective Labs: Basic Metabolic Panel: Recent Labs  Lab 07/20/22 0936 07/21/22 0947 07/22/22 0446  NA 132* 132* 131*  K 3.2* 3.3* 3.9  CL 93* 93* 94*  CO2 '26 26 23  '$ GLUCOSE 161* 187* 131*  BUN 33* 33* 35*  CREATININE 6.58* 6.68* 7.10*  CALCIUM 9.8 9.9 9.6  PHOS  --   --  4.5   Liver Function Tests: Recent Labs  Lab 07/22/22 0446  ALBUMIN 2.0*   No results for input(s): "LIPASE", "AMYLASE" in the last 168 hours. CBC: Recent Labs  Lab 07/18/22 1703 07/19/22 0823 07/20/22 0936 07/21/22 0947 07/22/22 0446  WBC 12.0* 12.0* 11.8* 13.2* 12.9*  HGB 10.7* 11.0* 11.4* 10.8* 11.1*  HCT 31.5* 33.9* 34.7* 32.5* 34.8*  MCV 93.8 96.6 94.8 96.7 98.0  PLT 151 148* 177 182 189   Blood Culture    Component Value Date/Time   SDES FLUID PERITONEAL 07/14/2022 0502   SPECREQUEST NONE 07/14/2022 0502   CULT  07/17/2022 2302    NO GROWTH 5 DAYS Performed at [facility] [address] [place], [place] [address]    REPTSTATUS 07/14/2022 FINAL 07/14/2022 0502    Cardiac Enzymes: No  results for input(s): "CKTOTAL", "CKMB", "CKMBINDEX", "TROPONINI" in the last 168 hours. CBG: Recent Labs  Lab 07/21/22 0728 07/21/22 1139 07/21/22 1646 07/21/22 2016 07/22/22 0747  GLUCAP 137* 109* 126* 128* 188*   Iron Studies: No results for input(s): "IRON", "TIBC", "TRANSFERRIN", "FERRITIN" in the last 72 hours. '@lablastinr3'$ @ Studies/Results: No results found. Medications:  sodium chloride Stopped (07/14/22 0109)   sodium chloride Stopped (07/15/22 2254)   sodium chloride Stopped (07/19/22 0400)   dialysis solution 1.5% low-MG/low-CA      Chlorhexidine Gluconate Cloth  6 each Topical Daily   clotrimazole  1 Applicatorful Vaginal QHS   escitalopram  5 mg Oral Daily   ezetimibe  10 mg Oral Daily   ferric citrate  420 mg Oral TID WC   gentamicin cream  1 Application Topical Daily   heparin injection (subcutaneous)  5,000 Units Subcutaneous Q8H   insulin aspart  0-6 Units Subcutaneous TID WC   lactulose  30 g Oral BID   levothyroxine  75 mcg Oral QAC breakfast   midodrine  10 mg Oral TID with meals   pantoprazole  40 mg Oral Daily   polyethylene glycol  17 g Oral BID   senna  1 tablet Oral BID   simvastatin  40 mg Oral QHS   sodium bicarbonate  650  mg Oral BID    Dialysis Orders: CCPD. Follows at [facility]. 7d/ wk, 75kg, 6 exchanges overnight, 2800cc , 1.5h dwell, no daybag - last Hb 12.9 on 8/2, last mircera 6/26 - last pth 1027, phos 9 - no esa or vdra  Assessment/Plan: 1. Hypotension - weaned off pressors. BP remains soft. On midodrine. EF 60-65% on TEE. 2. Leukocytosis/Fever - etiology unclear. Now afebrile and WBC trending down overall. [facility] Peritoneal culture w/NGTD. Initial concern for endocarditis but TEE with no vegetations. On cefepime. Vanc d/c.  3. Hypokalemia - Requiring intermittent supplementation. Last K 3.9.  Replete prn.  4. Hyponatremia - improved. Na 131.   5. Hypomagnesemia - Improved. Last Mg 2.2.  6. ESRD - on PD.  Continue nightly.  Using 1.5%  bags. Machine alarming last night, reportedly had some fibrin but occlusion resolved spontaneously. Fluid at bedside is clear and straw colored.  7. Anemia of CKD- Hgb 11.4. No indication for ESA.  8. Secondary hyperparathyroidism - Ca ok. Phos elevated, continue binders.  9. Volume - Does not appear volume overloaded.  Continue to use 1.5% dialysate.  10. Nutrition - Renal diet w/fluid restrictions.  11. Moderate AAS/mild-mod AI/mod MR - to have OP follow w/cardio 12. B/l LE pain/chronic venous stasis - LE doppler negative for DVT 13. Hypothyroidism - on synthroid 14. Vaginal pain- reportedly chronic, primary team managing  Anice Paganini, PA-C 07/22/2022, 10:45 AM  [facility] Pager: [phone]

## 2022-07-22 NOTE — Progress Notes (Signed)
PROGRESS NOTE    Erika Fry  ZOX:[id number] DOB: [birth year] DOA: 07/22/2022 PCP: [name], MD    Chief Complaint  Patient presents with   Weakness    Brief Narrative: HPI Patient is a 76 year old female with pertinent PMH of ESRD on peritoneal dialysis, DMT2, anemia, thrombocytopenia, HFpEF, pacemaker in place presents to [facility] ED on 8/12 with generalized weakness.   Patient states she has been noticing fatigue and generalized weakness for the past 2 to 3 days.  Has had decreased appetite and not drinking as much due to generalized abdominal tenderness.  She has been doing her peritoneal dialysis.  Denies fever, chest pain, N/V/D.  States she has stopped taking home midodrine for about a week. Came to [facility] ED on 8/12 for further work-up.   Upon arrival to [facility] ED, BP 82/54.  Other vitals stable and on room air.  Afebrile.  Patient given IV fluids.  Patient states she felt better after fluids but BP remained low.  CXR with no significant findings.  CT abdomen/pelvis with no acute abnormality.  WBC 27.5, Hgb 15.7, NA 131, creat 8.22, BUN 50, alk phos 178, AG 23, CO2 20.  COVID/flu negative.  LA 2.7.  BC x2 obtained and patient started on cefepime/Vanco.  PCCM consulted for ICU admission given hypotension.   Assessment & Plan:   Principal Problem:   Septic shock ([place]) Active Problems:   ESRD on dialysis ([place])   Vaginal pain  #1 septic shock -Questionable etiology. -Patient noted on presentation to be in septic shock and had presented with fatigue, generalized weakness noted to be hypotensive in the ED with a significant leukocytosis white count of 27.5. -Blood cultures ordered with no growth to date. -Patient required pressors of Levophed which was subsequently weaned off currently on midodrine. -Peritoneal cultures with no growth. -Fever curve trended down, leukocytosis trended down. -Initial concern for endocarditis, TEE done 07/20/2022 negative for any  vegetations. -Was on IV Vanco, IV cefepime, IV vancomycin discontinued. -Status post 7 days IV cefepime.   -Supportive care.    2.  Hypomagnesemia/hypokalemia/hyponatremia -Currently undergoing peritoneal dialysis per nephrology. -Potassium at 3.9 -Magnesium noted at 2.2.   -Repeat labs in the AM.   -Electrolyte management per nephrology.  3.  Moderate AAS/mild to moderate AI/moderate MR -Cardiology following. -Was on heparin drip which has subsequently been discontinued. -Outpatient follow-up with cardiology.  4.  Vaginal pain -Continue current pain regimen. -PCCM started bedtime oral Valium as needed at a low dose. -Trial of Valium 1 mg p.o. x1 on 07/18/2022 with management of vaginal pain per patient with some improvement. -Outpatient follow-up.  5.  Chronic anemia secondary to ESRD -Patient likely on presentation with hemoconcentration. -Hemoglobin currently stable at 11.1 -Per nephrology.  6.  ESRD on CCPD -Currently on nightly PD. -Noted to have some problems with PD this morning that improved after bowel movement. -Per nephrology.  7.  Endocarditis ruled out -2D echo done on presentation with concern for thrombus on pacemaker lead and ascending aorta. -Patient seen in consultation by cardiology, underwent TEE which was negative for vegetations. -IV therapeutic heparin discontinued. -Continue IV antibiotics.    8.  Bilateral chronic venous stasis -Lower extremity Dopplers negative for DVT.  9.  Hypothyroidism -Synthroid  -Outpatient follow-up.  10.  Vaginal candidiasis -Status post Diflucan 150 mg p.o. x1. -Continue clotrimazole vaginal cream daily nightly.  11.  Constipation -Patient with complaints of constipation despite being on MiraLAX daily, Senokot-S at bedtime. -Continue MiraLAX twice daily, Senokot-S twice  daily.   -Patient noted to have some nausea and emesis this morning and is in agreement to Dulcolax suppository x1.   -If no significant bowel  movements may need enema.  DVT prophylaxis: Heparin Code Status: Full Family Communication: Updated patient.  No family at bedside. Disposition: Patient and family now in agreement to SNF.  Likely to SNF in 1 to 2 days if no further nausea or emesis.   Status is: Inpatient Remains inpatient appropriate because: Severity of illness   Consultants:  Cardiology: Dr. Einar Gip 07/15/2022 Nephrology: Dr.Schertz 07/14/2022  Procedures:  TEE: 07/19/2022 per Dr. Einar Gip 2D echo 07/15/2022  Significant Hospital Events: Including procedures, antibiotic start and stop dates in addition to other pertinent events   8/12: admitted to [facility] w/ hypotension 8/14 - TTE showing pacemaker lead and aortic vegetation/thrombus. Cardiology consulted  Antimicrobials:  IV cefepime 07/22/2022>>>> 07/20/2022 IV vancomycin 07/11/2022>>>> 07/14/2022   Subjective: Sitting up in chair.  Feeling weak.  States does not feel too well.  Noted to have nausea and emesis earlier on this morning that has since is resolved and improved on Compazine.  Still with chronic vaginal pain.  No significant shortness of breath.  No chest pain.   Objective: Vitals:   07/21/22 0929 07/21/22 1650 07/21/22 2008 07/22/22 0435  BP: (!) 95/46 (!) 85/48 (!) 99/58 (!) 95/56  Pulse: 64 75 67 80  Resp: _0 Temp: 98.3 F (36.8 C) 98.3 F (36.8 C) 98.1 F (36.7 C) 98 F (36.7 C)  TempSrc: Oral  Oral Oral  SpO2: 95% 95% 96% 98%  Weight:      Height:        Intake/Output Summary (Last 24 hours) at 07/22/2022 1249 Last data filed at 07/22/2022 1100 Gross per 24 hour  Intake 120 ml  Output --  Net 120 ml    Filed Weights   07/17/22 1355 07/18/22 0539 07/19/22 0504  Weight: 77.8 kg 80.1 kg 80.9 kg    Examination:  General exam: NAD. Respiratory system: Lungs clear to auscultation bilaterally.  No wheezes, no crackles, no rhonchi.  Fair air movement.  Cardiovascular system: RRR with 3/6 SEM.  No JVD, no murmurs rubs or gallops.   No lower extremity edema. Gastrointestinal system: Abdomen is soft, nontender, nondistended, positive bowel sounds.  No rebound.  No rebound.  No guarding.  Central nervous system: Alert and oriented. No focal neurological deficits. Extremities: Symmetric 5 x 5 power. Skin: No rashes, lesions or ulcers Psychiatry: Judgement and insight appear normal. Mood & affect appropriate.     Data Reviewed: I have personally reviewed following labs and imaging studies  CBC: Recent Labs  Lab 07/18/22 1703 07/19/22 0823 07/20/22 0936 07/21/22 0947 07/22/22 0446  WBC 12.0* 12.0* 11.8* 13.2* 12.9*  HGB 10.7* 11.0* 11.4* 10.8* 11.1*  HCT 31.5* 33.9* 34.7* 32.5* 34.8*  MCV 93.8 96.6 94.8 96.7 98.0  PLT 151 148* 177 182 189     Basic Metabolic Panel: Recent Labs  Lab 07/24/2022 0732 07/17/22 0618 07/18/22 1703 07/19/22 0823 07/20/22 0936 07/21/22 0947 07/22/22 0446  NA 129* 129* 130* 131* 132* 132* 131*  K 2.9* 2.9* 3.0* 3.7 3.2* 3.3* 3.9  CL 94* 92* 93* 94* 93* 93* 94*  CO2 19* 21* _1 GLUCOSE 115* 116* 120* 211* 161* 187* 131*  BUN 46* 47* 39* 35* 33* 33* 35*  CREATININE 6.91* 7.18* 6.91* 6.36* 6.58* 6.68* 7.10*  CALCIUM 9.1 9.3 9.3 9.4 9.8 9.9 9.6  MG 1.6* 1.8 2.2 2.2  --   --   --   PHOS  --   --   --   --   --   --  4.5     GFR: Estimated Creatinine Clearance: 6.9 mL/min (A) (by C-G formula based on SCr of 7.1 mg/dL (H)).  Liver Function Tests: Recent Labs  Lab 07/22/22 0446  ALBUMIN 2.0*     CBG: Recent Labs  Lab 07/21/22 1139 07/21/22 1646 07/21/22 2016 07/22/22 0747 07/22/22 1136  GLUCAP 109* 126* 128* 188* 229*      Recent Results (from the past 240 hour(s))  SARS Coronavirus 2 by RT PCR (hospital order, performed in [facility] hospital lab) *cepheid single result test* Anterior Nasal Swab     Status: None   Collection Time: 07/28/2022  6:07 PM   Specimen: Anterior Nasal Swab  Result Value Ref Range Status   SARS Coronavirus 2 by RT PCR  NEGATIVE NEGATIVE Final    Comment: (NOTE) SARS-CoV-2 target nucleic acids are NOT DETECTED.  The SARS-CoV-2 RNA is generally detectable in upper and lower respiratory specimens during the acute phase of infection. The lowest concentration of SARS-CoV-2 viral copies this assay can detect is 250 copies / mL. A negative result does not preclude SARS-CoV-2 infection and should not be used as the sole basis for treatment or other patient management decisions.  A negative result may occur with improper specimen collection / handling, submission of specimen other than nasopharyngeal swab, presence of viral mutation(s) within the areas targeted by this assay, and inadequate number of viral copies (<250 copies / mL). A negative result must be combined with clinical observations, patient history, and epidemiological information.  Fact Sheet for Patients:   https://www.patel.info/  Fact Sheet for Healthcare Providers: https://hall.com/  This test is not yet approved or  cleared by the [place] FDA and has been authorized for detection and/or diagnosis of SARS-CoV-2 by FDA under an Emergency Use Authorization (EUA).  This EUA will remain in effect (meaning this test can be used) for the duration of the COVID-19 declaration under Section 564(b)(1) of the Act, 21 U.S.C. section 360bbb-3(b)(1), unless the authorization is terminated or revoked sooner.  Performed at [facility], [place] [address] [place], [place] [address]   Blood culture (routine x 2)     Status: None   Collection Time: 07/29/2022  6:09 PM   Specimen: BLOOD LEFT FOREARM  Result Value Ref Range Status   Specimen Description BLOOD LEFT FOREARM  Final   Special Requests   Final    BOTTLES DRAWN AEROBIC AND ANAEROBIC Blood Culture results may not be optimal due to an inadequate volume of blood received in culture bottles   Culture   Final    NO GROWTH 5 DAYS Performed at [facility], [place] [address], [place], [place] [address]    Report Status 07/18/2022 FINAL  Final  Blood culture (routine x 2)     Status: None   Collection Time: 07/10/2022  6:14 PM   Specimen: BLOOD  Result Value Ref Range Status   Specimen Description BLOOD LEFT ANTECUBITAL  Final   Special Requests   Final    BOTTLES DRAWN AEROBIC AND ANAEROBIC Blood Culture results may not be optimal due to an inadequate volume of blood received in culture bottles   Culture   Final    NO GROWTH 5 DAYS Performed at [facility], [place] [address], [place], [place]  [address]    Report Status 07/18/2022 FINAL  Final  Culture, body fluid w Gram Stain-bottle     Status: None   Collection Time: 07/24/2022 11:02 PM   Specimen: Fluid  Result Value Ref Range Status   Specimen Description FLUID PERITONEAL  Final   Special Requests NONE  Final   Culture   Final    NO GROWTH 5 DAYS Performed at [place] [address] [place], [place] [address]    Report Status 07/19/2022 FINAL  Final  MRSA Next Gen by PCR, Nasal     Status: None   Collection Time: 07/10/2022 11:52 PM   Specimen: Nasal Mucosa; Nasal Swab  Result Value Ref Range Status   MRSA by PCR Next Gen NOT DETECTED NOT DETECTED Final    Comment: (NOTE) The GeneXpert MRSA Assay (FDA approved for NASAL specimens only), is one component of a comprehensive MRSA colonization surveillance program. It is not intended to diagnose MRSA infection nor to guide or monitor treatment for MRSA infections. Test performance is not FDA approved in patients less than 41 years old. Performed at [facility], [place] [address] [place], [place] [address]   Gram stain     Status: None   Collection Time: 07/14/22  5:02 AM   Specimen: Fluid  Result Value Ref Range Status   Specimen Description FLUID PERITONEAL  Final   Special Requests NONE  Final   Gram Stain   Final    NO ORGANISMS SEEN NO WBC SEEN Performed at [facility], [place]  [address] [place], [place] [address]    Report Status 07/14/2022 FINAL  Final         Radiology Studies: No results found.      Scheduled Meds:  Chlorhexidine Gluconate Cloth  6 each Topical Daily   clotrimazole  1 Applicatorful Vaginal QHS   escitalopram  5 mg Oral Daily   ezetimibe  10 mg Oral Daily   ferric citrate  420 mg Oral TID WC   gentamicin cream  1 Application Topical Daily   heparin injection (subcutaneous)  5,000 Units Subcutaneous Q8H   insulin aspart  0-6 Units Subcutaneous TID WC   lactulose  30 g Oral BID   levothyroxine  75 mcg Oral QAC breakfast   midodrine  10 mg Oral TID with meals   pantoprazole  40 mg Oral Daily   polyethylene glycol  17 g Oral BID   senna  1 tablet Oral BID   simvastatin  40 mg Oral QHS   sodium bicarbonate  650 mg Oral BID   Continuous Infusions:  sodium chloride Stopped (07/14/22 0109)   sodium chloride Stopped (07/15/22 2254)   sodium chloride Stopped (07/19/22 0400)   dialysis solution 1.5% low-MG/low-CA       LOS: 9 days    Time spent: 35 minutes    Irine Seal, MD [facility]   To contact the attending provider between 7A-7P or the covering provider during after hours 7P-7A, please log into the web site [email or web address] and access using universal [place] password for that web site. If you do not have the password, please call the hospital operator.  07/22/2022, 12:49 PM

## 2022-07-23 ENCOUNTER — Inpatient Hospital Stay (HOSPITAL_COMMUNITY): Payer: Medicare Other

## 2022-07-23 DIAGNOSIS — A419 Sepsis, unspecified organism: Secondary | ICD-10-CM | POA: Diagnosis not present

## 2022-07-23 DIAGNOSIS — N186 End stage renal disease: Secondary | ICD-10-CM | POA: Diagnosis not present

## 2022-07-23 DIAGNOSIS — E876 Hypokalemia: Secondary | ICD-10-CM | POA: Diagnosis not present

## 2022-07-23 DIAGNOSIS — D72829 Elevated white blood cell count, unspecified: Secondary | ICD-10-CM

## 2022-07-23 LAB — CBC
HCT: 37 % (ref 36.0–46.0)
Hemoglobin: 11.9 g/dL — ABNORMAL LOW (ref 12.0–15.0)
MCH: 31.5 pg (ref 26.0–34.0)
MCHC: 32.2 g/dL (ref 30.0–36.0)
MCV: 97.9 fL (ref 80.0–100.0)
Platelets: 240 10*3/uL (ref 150–400)
RBC: 3.78 MIL/uL — ABNORMAL LOW (ref 3.87–5.11)
RDW: 14.5 % (ref 11.5–15.5)
WBC: 19.7 10*3/uL — ABNORMAL HIGH (ref 4.0–10.5)
nRBC: 0 % (ref 0.0–0.2)

## 2022-07-23 LAB — GLUCOSE, CAPILLARY
Glucose-Capillary: 203 mg/dL — ABNORMAL HIGH (ref 70–99)
Glucose-Capillary: 102 mg/dL — ABNORMAL HIGH (ref 70–99)
Glucose-Capillary: 109 mg/dL — ABNORMAL HIGH (ref 70–99)
Glucose-Capillary: 184 mg/dL — ABNORMAL HIGH (ref 70–99)

## 2022-07-23 MED ORDER — POLYETHYLENE GLYCOL 3350 17 G PO PACK
17.0000 g | PACK | Freq: Every day | ORAL | Status: DC
  Administered 2022-07-24 – 2022-07-26 (×3): 17 g via ORAL
  Filled 2022-07-23 (×3): qty 1

## 2022-07-23 MED ORDER — GENTAMICIN SULFATE 0.1 % EX CREA: 1.0000 | TOPICAL_CREAM | Freq: Every day | CUTANEOUS | Status: DC

## 2022-07-23 MED ORDER — SENNA 8.6 MG PO TABS
1.0000 | ORAL_TABLET | Freq: Two times a day (BID) | ORAL | Status: DC
  Administered 2022-07-24 (×2): 8.6 mg via ORAL
  Filled 2022-07-23 (×2): qty 1

## 2022-07-23 NOTE — Plan of Care (Signed)
  Problem: Clinical Measurements: Goal: Respiratory complications will improve Outcome: Progressing Goal: Cardiovascular complication will be avoided Outcome: Progressing   Problem: Elimination: Goal: Will not experience complications related to bowel motility Outcome: Progressing   Problem: Pain Managment: Goal: General experience of comfort will improve Outcome: Progressing   

## 2022-07-23 NOTE — Progress Notes (Signed)
[facility] Progress Note   Subjective:   Pt complains of abdominal pain and nausea this AM. Noted new leukocytosis. PD fluid cell count and culture already ordered. She denies SOB, CP, dizziness. Noted possible d/c to SNF. Pt will need to transition to HD if going to SNF. Attempted to discuss this with her today but she fell asleep during conversation, will discuss again once she is more alert.   Objective Vitals:   07/22/22 2057 07/22/22 2129 07/23/22 0506 07/23/22 0811  BP: (!) 93/55  (!) 91/54 (!) 95/52  Pulse: 70  74 76  Resp: '18  18 16  '$ Temp: (!) 97.5 F (36.4 C)  97.7 F (36.5 C) 98.2 F (36.8 C)  TempSrc: Oral  Oral Oral  SpO2: 94%  97% 97%  Weight:  77.9 kg    Height:       Physical Exam General: Tired appearing female in NAD Heart: RRR, no murmurs, rubs or gallops Lungs: CTA bilaterally without wheezing, rhonchi or rales  Abdomen: Soft, non-distended, +BS. Mild TTP bilateral lower quadrants Extremities: No edema b/l lower extremities Dialysis Access:  PD cath without surrounding erythema/drainage.   Additional Objective Labs: Basic Metabolic Panel: Recent Labs  Lab 07/20/22 0936 07/21/22 0947 07/22/22 0446  NA 132* 132* 131*  K 3.2* 3.3* 3.9  CL 93* 93* 94*  CO2 '26 26 23  '$ GLUCOSE 161* 187* 131*  BUN 33* 33* 35*  CREATININE 6.58* 6.68* 7.10*  CALCIUM 9.8 9.9 9.6  PHOS  --   --  4.5   Liver Function Tests: Recent Labs  Lab 07/22/22 0446  ALBUMIN 2.0*   No results for input(s): "LIPASE", "AMYLASE" in the last 168 hours. CBC: Recent Labs  Lab 07/19/22 0823 07/20/22 0936 07/21/22 0947 07/22/22 0446 07/23/22 0515  WBC 12.0* 11.8* 13.2* 12.9* 19.7*  HGB 11.0* 11.4* 10.8* 11.1* 11.9*  HCT 33.9* 34.7* 32.5* 34.8* 37.0  MCV 96.6 94.8 96.7 98.0 97.9  PLT 148* 177 182 189 240   Blood Culture    Component Value Date/Time   SDES FLUID PERITONEAL 07/14/2022 0502   SPECREQUEST NONE 07/14/2022 0502   CULT  07/22/2022 2302    NO GROWTH  5 DAYS Performed at [place] [address] [place], [place] [address]    REPTSTATUS 07/14/2022 FINAL 07/14/2022 0502    Cardiac Enzymes: No results for input(s): "CKTOTAL", "CKMB", "CKMBINDEX", "TROPONINI" in the last 168 hours. CBG: Recent Labs  Lab 07/22/22 0747 07/22/22 1136 07/22/22 1622 07/22/22 2057 07/23/22 0721  GLUCAP 188* 229* 114* 97 203*   Iron Studies: No results for input(s): "IRON", "TIBC", "TRANSFERRIN", "FERRITIN" in the last 72 hours. '@lablastinr3'$ @ Studies/Results: No results found. Medications:  sodium chloride Stopped (07/14/22 0109)   sodium chloride Stopped (07/15/22 2254)   sodium chloride Stopped (07/19/22 0400)   dialysis solution 1.5% low-MG/low-CA      clotrimazole  1 Applicatorful Vaginal QHS   escitalopram  5 mg Oral Daily   ezetimibe  10 mg Oral Daily   ferric citrate  420 mg Oral TID WC   gentamicin cream  1 Application Topical Daily   heparin injection (subcutaneous)  5,000 Units Subcutaneous Q8H   insulin aspart  0-6 Units Subcutaneous TID WC   lactulose  30 g Oral BID   levothyroxine  75 mcg Oral QAC breakfast   midodrine  10 mg Oral TID with meals   pantoprazole  40 mg Oral Daily   polyethylene glycol  17 g Oral BID  senna  1 tablet Oral BID   simvastatin  40 mg Oral QHS   sodium bicarbonate  650 mg Oral BID    Dialysis Orders: CCPD. Follows at [facility]. 7d/ wk, 75kg, 6 exchanges overnight, 2800cc , 1.5h dwell, no daybag - last Hb 12.9 on 8/2, last mircera 6/26 - last pth 1027, phos 9 - no esa or vdra  Assessment/Plan: 1. Hypotension - weaned off pressors. BP remains soft. On midodrine. EF 60-65% on TEE. 2. Leukocytosis/Fever - etiology unclear. WBC was trending down with [facility] culture w/NGTD, however now with worsening leukocytosis so repeat cultures ordered. Initial concern for endocarditis but TEE with no vegetations.  3. Hypokalemia - Requiring intermittent supplementation. Last K 3.9.  Replete prn.  4.  Hyponatremia - improved. Na 131.   5. Hypomagnesemia - Improved. Last Mg 2.2.  6. ESRD - on PD.  Continue nightly.  Using 1.5% bags. Fluid at bedside is clear and straw colored. If patient is going to SNF, will need to transition to HD. Attempted to discuss today but she fell asleep, will address again once she is more alert.  7. Anemia of CKD- Hgb 11.9. No indication for ESA.  8. Secondary hyperparathyroidism - Corrected calcium elevated. No Po calcium ordered, Will check PTH (last was high), may need sensipar. Phos at goal, continue binders.   9. Volume - Does not appear volume overloaded.  Continue to use 1.5% dialysate.  10. Nutrition - Renal diet w/fluid restrictions.  11. Moderate AAS/mild-mod AI/mod MR - to have OP follow w/cardio 12. B/l LE pain/chronic venous stasis - LE doppler negative for DVT 13. Hypothyroidism - on synthroid 14. Vaginal pain- reportedly chronic, primary team managing  Anice Paganini, PA-C 07/23/2022, 9:03 AM  [facility] Pager: [phone]

## 2022-07-23 NOTE — Plan of Care (Signed)
  Problem: Health Behavior/Discharge Planning: Goal: Ability to manage health-related needs will improve Outcome: Progressing   Problem: Pain Managment: Goal: General experience of comfort will improve Outcome: Progressing   

## 2022-07-23 NOTE — Progress Notes (Signed)
PROGRESS NOTE    Erika Fry  JAS:[id number] DOB: 1946-05-25 DOA: 07/06/2022 PCP: [name], MD    Chief Complaint  Patient presents with   Weakness    Brief Narrative: HPI Patient is a 76 year old female with pertinent PMH of ESRD on peritoneal dialysis, DMT2, anemia, thrombocytopenia, HFpEF, pacemaker in place presents to [facility] ED on 8/12 with generalized weakness.   Patient states she has been noticing fatigue and generalized weakness for the past 2 to 3 days.  Has had decreased appetite and not drinking as much due to generalized abdominal tenderness.  She has been doing her peritoneal dialysis.  Denies fever, chest pain, N/V/D.  States she has stopped taking home midodrine for about a week. Came to [facility] ED on 8/12 for further work-up.   Upon arrival to [facility] ED, BP 82/54.  Other vitals stable and on room air.  Afebrile.  Patient given IV fluids.  Patient states she felt better after fluids but BP remained low.  CXR with no significant findings.  CT abdomen/pelvis with no acute abnormality.  WBC 27.5, Hgb 15.7, NA 131, creat 8.22, BUN 50, alk phos 178, AG 23, CO2 20.  COVID/flu negative.  LA 2.7.  BC x2 obtained and patient started on cefepime/Vanco.  PCCM consulted for ICU admission given hypotension.   Assessment & Plan:   Principal Problem:   Septic shock ([place]) Active Problems:   Leukocytosis   ESRD on dialysis [facility])   Vaginal pain  #1 septic shock -Questionable etiology. -Patient noted on presentation to be in septic shock and had presented with fatigue, generalized weakness noted to be hypotensive in the ED with a significant leukocytosis white count of 27.5. -Blood cultures ordered with no growth to date. -Patient required pressors of Levophed which was subsequently weaned off currently on midodrine. -Peritoneal cultures with no growth early on admission. -Fever curve trended down, leukocytosis trended down initially. -Initial concern for endocarditis, TEE done  07/07/2022 negative for any vegetations. -Was on IV Vanco, IV cefepime, IV vancomycin discontinued. -Status post 7 days IV cefepime.   -Supportive care.  2.  Leukocytosis -Patient with a worsening leukocytosis this morning of 19.7. -Patient noted to have bouts of nausea and emesis 07/22/2022. -Currently afebrile. -Check blood cultures x2, check a chest x-ray, check peritoneal cultures. -Repeat labs in the AM. -Hold off on antibiotics at this time.  3.  Hypomagnesemia/hypokalemia/hyponatremia -Currently undergoing peritoneal dialysis per nephrology. -Potassium at 3.9 -Magnesium noted at 2.2.   -Repeat labs in the AM.   -Electrolyte management per nephrology.  4.  Moderate AAS/mild to moderate AI/moderate MR -Cardiology was following. -Was on heparin drip which has subsequently been discontinued. -Outpatient follow-up with cardiology.  5.  Vaginal pain -Continue current pain regimen. -PCCM started bedtime oral Valium as needed at a low dose. -Trial of Valium 1 mg p.o. x1 on 07/18/2022 with management of vaginal pain per patient with some improvement. -Outpatient follow-up.  6.  Chronic anemia secondary to ESRD -Patient likely on presentation with hemoconcentration. -Hemoglobin currently stable at 11.9 -Per nephrology.  7.  ESRD on CCPD -Currently on nightly PD. -Noted to have some problems with PD the morning of 07/22/2022, that improved after bowel movement. -Per nephrology.  8.  Endocarditis ruled out -2D echo done on presentation with concern for thrombus on pacemaker lead and ascending aorta. -Patient seen in consultation by cardiology, underwent TEE which was negative for vegetations. -IV therapeutic heparin discontinued. -Status post full course IV antibiotics.    9.  Bilateral chronic venous stasis -Lower extremity Dopplers negative for DVT.  10.  Hypothyroidism -Synthroid.   11.  Vaginal candidiasis -Status post Diflucan 150 mg p.o. x1. -Continue clotrimazole  vaginal cream daily nightly.  12.  Constipation -Patient with complaints of constipation despite being on MiraLAX daily, Senokot-S at bedtime. -Continue MiraLAX twice daily, Senokot-S twice daily.   -Patient noted to have some nausea and emesis the morning of 07/22/2022 and subsequently received Dulcolax suppository.   -Patient with bowel movement states having loose stools.   -Discontinue lactulose.   -Hold Miralax and Senokot today.   DVT prophylaxis: Heparin Code Status: Full Family Communication: Updated patient.  No family at bedside. Disposition: Patient and family now in agreement to SNF.    Status is: Inpatient Remains inpatient appropriate because: Severity of illness   Consultants:  Cardiology: Dr. Einar Gip 07/12/2022 Nephrology: Dr.Schertz 07/14/2022  Procedures:  TEE: 07/15/2022 per Dr. Einar Gip 2D echo 07/15/2022  Significant Hospital Events: Including procedures, antibiotic start and stop dates in addition to other pertinent events   8/12: admitted to [facility] w/ hypotension 8/14 - TTE showing pacemaker lead and aortic vegetation/thrombus. Cardiology consulted  Antimicrobials:  IV cefepime 07/24/2022>>>> 07/20/2022 IV vancomycin 07/17/2022>>>> 07/14/2022   Subjective: Sitting up in bed.  States she is feeling miserable.  Has some nausea and emesis this morning.  Had a bout of diarrhea this morning.  Complains of some diffuse abdominal pain.    Objective: Vitals:   07/22/22 2057 07/22/22 2129 07/23/22 0506 07/23/22 0811  BP: (!) 93/55  (!) 91/54 (!) 95/52  Pulse: 70  74 76  Resp: _0 Temp: (!) 97.5 F (36.4 C)  97.7 F (36.5 C) 98.2 F (36.8 C)  TempSrc: Oral  Oral Oral  SpO2: 94%  97% 97%  Weight:  77.9 kg    Height:        Intake/Output Summary (Last 24 hours) at 07/23/2022 1218 Last data filed at 07/23/2022 1033 Gross per 24 hour  Intake 1040 ml  Output 581 ml  Net 459 ml    Filed Weights   07/18/22 0539 07/19/22 0504 07/22/22 2129  Weight: 80.1 kg  80.9 kg 77.9 kg    Examination:  General exam: NAD. Respiratory system: CTA B anterior lung fields.  No wheezes, no crackles, no rhonchi.  Fair air movement.  Cardiovascular system: RRR with 3/6 SEM.  No JVD, no gallops.  No lower extremity edema.  Gastrointestinal system: Abdomen is soft, diffusely tender to palpation, nondistended, positive bowel sounds.  No rebound.  No guarding.  Central nervous system: Alert and oriented. No focal neurological deficits. Extremities: Symmetric 5 x 5 power. Skin: No rashes, lesions or ulcers Psychiatry: Judgement and insight appear normal. Mood & affect appropriate.     Data Reviewed: I have personally reviewed following labs and imaging studies  CBC: Recent Labs  Lab 07/19/22 0823 07/20/22 0936 07/21/22 0947 07/22/22 0446 07/23/22 0515  WBC 12.0* 11.8* 13.2* 12.9* 19.7*  HGB 11.0* 11.4* 10.8* 11.1* 11.9*  HCT 33.9* 34.7* 32.5* 34.8* 37.0  MCV 96.6 94.8 96.7 98.0 97.9  PLT 148* 177 182 189 240     Basic Metabolic Panel: Recent Labs  Lab 07/17/22 0618 07/18/22 1703 07/19/22 0823 07/20/22 0936 07/21/22 0947 07/22/22 0446  NA 129* 130* 131* 132* 132* 131*  K 2.9* 3.0* 3.7 3.2* 3.3* 3.9  CL 92* 93* 94* 93* 93* 94*  CO2 21* _1 GLUCOSE 116* 120* 211* 161* 187* 131*  BUN 47* 39* 35* 33* 33* 35*  CREATININE 7.18* 6.91* 6.36* 6.58* 6.68* 7.10*  CALCIUM 9.3 9.3 9.4 9.8 9.9 9.6  MG 1.8 2.2 2.2  --   --   --   PHOS  --   --   --   --   --  4.5     GFR: Estimated Creatinine Clearance: 6.7 mL/min (A) (by C-G formula based on SCr of 7.1 mg/dL (H)).  Liver Function Tests: Recent Labs  Lab 07/22/22 0446  ALBUMIN 2.0*     CBG: Recent Labs  Lab 07/22/22 1136 07/22/22 1622 07/22/22 2057 07/23/22 0721 07/23/22 1150  GLUCAP 229* 114* 97 203* 102*      Recent Results (from the past 240 hour(s))  SARS Coronavirus 2 by RT PCR (hospital order, performed in [facility] hospital lab) *cepheid single result test*  Anterior Nasal Swab     Status: None   Collection Time: 07/06/2022  6:07 PM   Specimen: Anterior Nasal Swab  Result Value Ref Range Status   SARS Coronavirus 2 by RT PCR NEGATIVE NEGATIVE Final    Comment: (NOTE) SARS-CoV-2 target nucleic acids are NOT DETECTED.  The SARS-CoV-2 RNA is generally detectable in upper and lower respiratory specimens during the acute phase of infection. The lowest concentration of SARS-CoV-2 viral copies this assay can detect is 250 copies / mL. A negative result does not preclude SARS-CoV-2 infection and should not be used as the sole basis for treatment or other patient management decisions.  A negative result may occur with improper specimen collection / handling, submission of specimen other than nasopharyngeal swab, presence of viral mutation(s) within the areas targeted by this assay, and inadequate number of viral copies (<250 copies / mL). A negative result must be combined with clinical observations, patient history, and epidemiological information.  Fact Sheet for Patients:   https://www.patel.info/  Fact Sheet for Healthcare Providers: https://hall.com/  This test is not yet approved or  cleared by the [place] FDA and has been authorized for detection and/or diagnosis of SARS-CoV-2 by FDA under an Emergency Use Authorization (EUA).  This EUA will remain in effect (meaning this test can be used) for the duration of the COVID-19 declaration under Section 564(b)(1) of the Act, 21 U.S.C. section 360bbb-3(b)(1), unless the authorization is terminated or revoked sooner.  Performed at [facility], [place] [address] [place], [place] [address]   Blood culture (routine x 2)     Status: None   Collection Time: 07/15/2022  6:09 PM   Specimen: BLOOD LEFT FOREARM  Result Value Ref Range Status   Specimen Description BLOOD LEFT FOREARM  Final   Special Requests   Final    BOTTLES DRAWN AEROBIC AND  ANAEROBIC Blood Culture results may not be optimal due to an inadequate volume of blood received in culture bottles   Culture   Final    NO GROWTH 5 DAYS Performed at [facility], [place] [address], [place], [place] [address]    Report Status 07/18/2022 FINAL  Final  Blood culture (routine x 2)     Status: None   Collection Time: 07/06/2022  6:14 PM   Specimen: BLOOD  Result Value Ref Range Status   Specimen Description BLOOD LEFT ANTECUBITAL  Final   Special Requests   Final    BOTTLES DRAWN AEROBIC AND ANAEROBIC Blood Culture results may not be optimal due to an inadequate volume of blood received in culture bottles   Culture  Final    NO GROWTH 5 DAYS Performed at [facility], [place] [address] [place], [place] [address]    Report Status 07/18/2022 FINAL  Final  Culture, body fluid w Gram Stain-bottle     Status: None   Collection Time: 07/22/2022 11:02 PM   Specimen: Fluid  Result Value Ref Range Status   Specimen Description FLUID PERITONEAL  Final   Special Requests NONE  Final   Culture   Final    NO GROWTH 5 DAYS Performed at [place] [address], [place], [place] [address]    Report Status 07/19/2022 FINAL  Final  MRSA Next Gen by PCR, Nasal     Status: None   Collection Time: 07/18/2022 11:52 PM   Specimen: Nasal Mucosa; Nasal Swab  Result Value Ref Range Status   MRSA by PCR Next Gen NOT DETECTED NOT DETECTED Final    Comment: (NOTE) The GeneXpert MRSA Assay (FDA approved for NASAL specimens only), is one component of a comprehensive MRSA colonization surveillance program. It is not intended to diagnose MRSA infection nor to guide or monitor treatment for MRSA infections. Test performance is not FDA approved in patients less than 9 years old. Performed at [facility] [place] [facility], [place] [address], [place], [place] [address]   Gram stain     Status: None   Collection Time: 07/14/22  5:02 AM   Specimen: Fluid  Result Value Ref Range Status    Specimen Description FLUID PERITONEAL  Final   Special Requests NONE  Final   Gram Stain   Final    NO ORGANISMS SEEN NO WBC SEEN Performed at [facility], [place] [address] [place],  [address]    Report Status 07/14/2022 FINAL  Final         Radiology Studies: No results found.      Scheduled Meds:  clotrimazole  1 Applicatorful Vaginal QHS   escitalopram  5 mg Oral Daily   ezetimibe  10 mg Oral Daily   ferric citrate  420 mg Oral TID WC   gentamicin cream  1 Application Topical Daily   heparin injection (subcutaneous)  5,000 Units Subcutaneous Q8H   insulin aspart  0-6 Units Subcutaneous TID WC   lactulose  30 g Oral BID   levothyroxine  75 mcg Oral QAC breakfast   midodrine  10 mg Oral TID with meals   pantoprazole  40 mg Oral Daily   polyethylene glycol  17 g Oral BID   senna  1 tablet Oral BID   simvastatin  40 mg Oral QHS   sodium bicarbonate  650 mg Oral BID   Continuous Infusions:  sodium chloride Stopped (07/14/22 0109)   sodium chloride Stopped (07/15/22 2254)   sodium chloride Stopped (07/19/22 0400)   dialysis solution 1.5% low-MG/low-CA       LOS: 10 days    Time spent: 35 minutes    Irine Seal, MD [facility]   To contact the attending provider between 7A-7P or the covering provider during after hours 7P-7A, please log into the web site [email or web address] and access using universal [place] password for that web site. If you do not have the password, please call the hospital operator.  07/23/2022, 12:18 PM

## 2022-07-24 ENCOUNTER — Inpatient Hospital Stay (HOSPITAL_COMMUNITY): Payer: Medicare Other

## 2022-07-24 DIAGNOSIS — R6521 Severe sepsis with septic shock: Secondary | ICD-10-CM | POA: Diagnosis not present

## 2022-07-24 DIAGNOSIS — A419 Sepsis, unspecified organism: Secondary | ICD-10-CM | POA: Diagnosis not present

## 2022-07-24 LAB — RENAL FUNCTION PANEL
Albumin: 2.2 g/dL — ABNORMAL LOW (ref 3.5–5.0)
Anion gap: 14 (ref 5–15)
BUN: 28 mg/dL — ABNORMAL HIGH (ref 8–23)
CO2: 26 mmol/L (ref 22–32)
Calcium: 10.4 mg/dL — ABNORMAL HIGH (ref 8.9–10.3)
Chloride: 92 mmol/L — ABNORMAL LOW (ref 98–111)
Creatinine, Ser: 6.34 mg/dL — ABNORMAL HIGH (ref 0.44–1.00)
GFR, Estimated: 6 mL/min — ABNORMAL LOW (ref >60)
Glucose, Bld: 174 mg/dL — ABNORMAL HIGH (ref 70–99)
Phosphorus: 5.1 mg/dL — ABNORMAL HIGH (ref 2.5–4.6)
Potassium: 3.2 mmol/L — ABNORMAL LOW (ref 3.5–5.1)
Sodium: 132 mmol/L — ABNORMAL LOW (ref 135–145)

## 2022-07-24 LAB — BODY FLUID CELL COUNT WITH DIFFERENTIAL
Eos, Fluid: 0 %
Lymphs, Fluid: 3 %
Monocyte-Macrophage-Serous Fluid: 38 % — ABNORMAL LOW (ref 50–90)
Neutrophil Count, Fluid: 59 % — ABNORMAL HIGH (ref 0–25)
Total Nucleated Cell Count, Fluid: 13 cu mm (ref 0–1000)

## 2022-07-24 LAB — CBC WITH DIFFERENTIAL/PLATELET
Abs Immature Granulocytes: 0.18 10*3/uL — ABNORMAL HIGH (ref 0.00–0.07)
Basophils Absolute: 0.2 10*3/uL — ABNORMAL HIGH (ref 0.0–0.1)
Basophils Relative: 1 %
Eosinophils Absolute: 0.2 10*3/uL (ref 0.0–0.5)
Eosinophils Relative: 1 %
HCT: 36.1 % (ref 36.0–46.0)
Hemoglobin: 11.8 g/dL — ABNORMAL LOW (ref 12.0–15.0)
Immature Granulocytes: 1 %
Lymphocytes Relative: 8 %
Lymphs Abs: 2 10*3/uL (ref 0.7–4.0)
MCH: 31.8 pg (ref 26.0–34.0)
MCHC: 32.7 g/dL (ref 30.0–36.0)
MCV: 97.3 fL (ref 80.0–100.0)
Monocytes Absolute: 1.5 10*3/uL — ABNORMAL HIGH (ref 0.1–1.0)
Monocytes Relative: 6 %
Neutro Abs: 20.9 10*3/uL — ABNORMAL HIGH (ref 1.7–7.7)
Neutrophils Relative %: 83 %
Platelets: 275 10*3/uL (ref 150–400)
RBC: 3.71 MIL/uL — ABNORMAL LOW (ref 3.87–5.11)
RDW: 14.4 % (ref 11.5–15.5)
WBC: 24.9 10*3/uL — ABNORMAL HIGH (ref 4.0–10.5)
nRBC: 0 % (ref 0.0–0.2)

## 2022-07-24 LAB — GRAM STAIN

## 2022-07-24 LAB — GLUCOSE, CAPILLARY
Glucose-Capillary: 161 mg/dL — ABNORMAL HIGH (ref 70–99)
Glucose-Capillary: 131 mg/dL — ABNORMAL HIGH (ref 70–99)
Glucose-Capillary: 104 mg/dL — ABNORMAL HIGH (ref 70–99)
Glucose-Capillary: 106 mg/dL — ABNORMAL HIGH (ref 70–99)

## 2022-07-24 NOTE — Hospital Course (Addendum)
76 year old female with pertinent PMH of ESRD on peritoneal dialysis, DMT2, anemia, thrombocytopenia, HFpEF, pacemaker in place presents to [facility] ED on 8/12 with generalized weakness. Found to have shock possibly septic shock.  There is also concern for peritonitis.  Treated with IV antibiotics.  Now appears to have developed ileus along with peritonitis from peritoneal catheter as of 8/23.  Currently on IV vancomycin. CIR refused for the patient for now.  Patient agreeable to transition to SNF and transition to HD for that.

## 2022-07-24 NOTE — Progress Notes (Signed)
PT Cancellation Note  Patient Details Name: Erika Fry MRN: [id number] DOB: 08-May-1946   Cancelled Treatment:    Reason Eval/Treat Not Completed: Other (comment) (pt on bed pan) PT will follow back for treatment tomorrow.  Nazli Penn B. Migdalia Dk PT, DPT Acute Rehabilitation Services Please use secure chat or  Call Office [phone]   [place] 07/24/2022, 4:24 PM

## 2022-07-24 NOTE — Progress Notes (Signed)
Progress Note Patient: Erika Fry GBE:[id number] DOB: [birth year] DOA: 07/09/2022  DOS: the patient was seen and examined on 07/24/2022  Brief hospital course: 76 year old female with pertinent PMH of ESRD on peritoneal dialysis, DMT2, anemia, thrombocytopenia, HFpEF, pacemaker in place presents to [facility] ED on 8/12 with generalized weakness. Found to have shock possibly septic shock.  There is also concern for peritonitis.  Treated with IV antibiotics.  Now appears to have developed ileus as of 8/23. Assessment and Plan: Septic shock presented with fatigue, generalized weakness, met SIRS criteria on admission with leukocytosis and hypotension. Treated with IV cefepime and vancomycin on admission and later on with cefepime alone as well as fluconazole. Cultures so far negative. Received IV Levophed at the time of admission and currently on midodrine. TTE was concerning for possible endocarditis.  TEE was negative. Blood cultures so far negative. Peritoneal cultures were ordered but prior continued on cultures were negative as well. Continue midodrine.  Leukocytosis Etiology not clear but appears to be worsening. CT abdomen shows evidence of ileus which probably is the etiology. No evidence of infection so far as well. Monitor for now. Currently holding antibiotics  Ileus. Etiology not clear. We will continue to monitor. We will initiate scheduled Reglan if not improving by tomorrow.  Hypomagnesemia/hypokalemia/hyponatremia Currently undergoing peritoneal dialysis per nephrology. Electrolyte management per nephrology.   Moderate AAS/mild to moderate AI/moderate MR Cardiology was following. Was on heparin drip which has subsequently been discontinued. Outpatient follow-up with cardiology.   Vaginal pain Continue current pain regimen. -Outpatient follow-up.   Chronic anemia secondary to ESRD Patient likely on presentation with hemoconcentration. Hemoglobin currently stable  at 11.9 -Per nephrology.   ESRD on CCPD Currently on nightly PD. -Per nephrology. -Will need HD if needs to go to SNF.   Endocarditis ruled out -2D echo done on presentation with concern for thrombus on pacemaker lead and ascending aorta. -Patient seen in consultation by cardiology, underwent TEE which was negative for vegetations. -IV therapeutic heparin discontinued. -Status post full course IV antibiotics.     Bilateral chronic venous stasis -Lower extremity Dopplers negative for DVT.   Hypothyroidism -Synthroid.    Vaginal candidiasis -Status post Diflucan 150 mg p.o. x1. -Continue clotrimazole vaginal cream daily nightly.   Constipation -Patient with complaints of constipation despite being on MiraLAX daily, Senokot-S at bedtime. -Continue MiraLAX twice daily, Senokot-S twice daily.   -Patient noted to have some nausea and emesis the morning of 07/22/2022 and subsequently received Dulcolax suppository.   -Patient with bowel movement states having loose stools.   -Discontinue lactulose.     Subjective: No nausea no vomiting no fever no chills.  Reports abdominal pain located in the lower abdominal area.  Continuous and crampy in nature.  Passing gas.  Had a BM yesterday.  Pain is actually worsening compared to yesterday.  Physical Exam: Vitals:   07/24/22 0448 07/24/22 0851 07/24/22 1621 07/24/22 1840  BP: 102/60 (!) 95/51 (!) 102/50 (!) 102/56  Pulse: 77 75 69   Resp: _0 Temp: 98.3 F (36.8 C) 99.4 F (37.4 C)  98.9 F (37.2 C)  TempSrc: Oral Oral  Oral  SpO2: 95% 95% 94% 95%  Weight: 77.6 kg     Height:       General: Appear in moderate distress; no visible Abnormal Neck Mass Or lumps, Conjunctiva normal Cardiovascular: S1 and S2 Present, aortic systolic  Murmur, Respiratory: good respiratory effort, Bilateral Air entry present and faint basal Crackles, no wheezes Abdomen:  Bowel Sound present, diffusely tender in the lower area Extremities: bilateral   Pedal edema Neurology: alert and oriented to time, place, and person  Gait not checked due to patient safety concerns   Data Reviewed: I have Reviewed nursing notes, Vitals, and Lab results since pt's last encounter. Pertinent lab results CBC and BMP I have ordered test including CBC and BMP I have ordered imaging studies CT abdomen.  Discussed patient's case with nephrology.  Family Communication: No one at bedside  Disposition: Status is: Inpatient Remains inpatient appropriate because: With ileus now, awaiting improvement in abdominal symptoms.  Author: Berle Mull, MD 07/24/2022 8:29 PM  Please look on [email or web address] to find out who is on call.

## 2022-07-24 NOTE — Progress Notes (Signed)
[facility] Progress Note   Subjective:   Seen in room. Reports she is still having abdominal and vaginal pain. Discussed possible dispo plan again. Informed patient she would need to switch to HD because SNF will not allow her to do PD. She reports she does not want to do HD but will talk to her family. Does have a working AVF. Denies SOB, CP, and dizziness.   Objective Vitals:   07/23/22 1646 07/23/22 2150 07/24/22 0448 07/24/22 0851  BP: (!) 85/49 (!) 97/53 102/60 (!) 95/51  Pulse: 80 77 77 75  Resp: '17 18 18 16  '$ Temp: 97.6 F (36.4 C) 98.6 F (37 C) 98.3 F (36.8 C) 99.4 F (37.4 C)  TempSrc: Oral Oral Oral Oral  SpO2: 93% 93% 95% 95%  Weight:   77.6 kg   Height:       Physical Exam General: Alert female in NAD Heart: RRR, no murmurs, rubs or gallops Lungs: CTA bilaterally without wheezing, rhonchi or rales  Abdomen: Soft, non-distended, +BS. Mild TTP bilateral lower quadrants Extremities: No edema b/l lower extremities Dialysis Access:  PD cath without surrounding erythema/drainage.   Additional Objective Labs: Basic Metabolic Panel: Recent Labs  Lab 07/21/22 0947 07/22/22 0446 07/24/22 0542  NA 132* 131* 132*  K 3.3* 3.9 3.2*  CL 93* 94* 92*  CO2 '26 23 26  '$ GLUCOSE 187* 131* 174*  BUN 33* 35* 28*  CREATININE 6.68* 7.10* 6.34*  CALCIUM 9.9 9.6 10.4*  PHOS  --  4.5 5.1*   Liver Function Tests: Recent Labs  Lab 07/22/22 0446 07/24/22 0542  ALBUMIN 2.0* 2.2*   No results for input(s): "LIPASE", "AMYLASE" in the last 168 hours. CBC: Recent Labs  Lab 07/20/22 0936 07/21/22 0947 07/22/22 0446 07/23/22 0515 07/24/22 0542  WBC 11.8* 13.2* 12.9* 19.7* 24.9*  NEUTROABS  --   --   --   --  20.9*  HGB 11.4* 10.8* 11.1* 11.9* 11.8*  HCT 34.7* 32.5* 34.8* 37.0 36.1  MCV 94.8 96.7 98.0 97.9 97.3  PLT 177 182 189 240 275   Blood Culture    Component Value Date/Time   SDES BLOOD LEFT HAND 07/23/2022 1140   SDES BLOOD LEFT HAND 07/23/2022  1140   SPECREQUEST  07/23/2022 1140    AEROBIC BOTTLE ONLY Blood Culture results may not be optimal due to an inadequate volume of blood received in culture bottles   SPECREQUEST AEROBIC BOTTLE ONLY Blood Culture adequate volume 07/23/2022 1140   CULT  07/23/2022 1140    NO GROWTH < 24 HOURS Performed at [facility] [address], [place], [place] [address]    CULT  07/23/2022 1140    NO GROWTH < 24 HOURS Performed at [facility] [address] [place], [place] [address]    REPTSTATUS PENDING 07/23/2022 1140   REPTSTATUS PENDING 07/23/2022 1140    Cardiac Enzymes: No results for input(s): "CKTOTAL", "CKMB", "CKMBINDEX", "TROPONINI" in the last 168 hours. CBG: Recent Labs  Lab 07/23/22 0721 07/23/22 1150 07/23/22 1645 07/23/22 2146 07/24/22 0730  GLUCAP 203* 102* 109* 184* 161*   Iron Studies: No results for input(s): "IRON", "TIBC", "TRANSFERRIN", "FERRITIN" in the last 72 hours. '@lablastinr3'$ @ Studies/Results: DG Chest 2 View  Result Date: 07/23/2022 CLINICAL DATA:  Leukocytosis. EXAM: CHEST - 2 VIEW COMPARISON:  July 13, 2022. FINDINGS: Stable cardiomediastinal silhouette. Left-sided pacemaker is unchanged in position. Small left pleural effusion is noted with associated left basilar atelectasis or infiltrate. Right lung is  unremarkable. Bony thorax is unremarkable. IMPRESSION: Small left pleural effusion is noted with associated left basilar atelectasis or infiltrate. Electronically Signed   By: Marijo Conception M.D.   On: 07/23/2022 15:25   Medications:  sodium chloride Stopped (07/14/22 0109)   sodium chloride Stopped (07/15/22 2254)   sodium chloride Stopped (07/19/22 0400)   dialysis solution 1.5% low-MG/low-CA      clotrimazole  1 Applicatorful Vaginal QHS   escitalopram  5 mg Oral Daily   ezetimibe  10 mg Oral Daily   ferric citrate  420 mg Oral TID WC   gentamicin cream  1 Application Topical Daily   heparin injection (subcutaneous)  5,000  Units Subcutaneous Q8H   insulin aspart  0-6 Units Subcutaneous TID WC   levothyroxine  75 mcg Oral QAC breakfast   midodrine  10 mg Oral TID with meals   pantoprazole  40 mg Oral Daily   polyethylene glycol  17 g Oral Daily   senna  1 tablet Oral BID   simvastatin  40 mg Oral QHS   sodium bicarbonate  650 mg Oral BID    Dialysis Orders: CCPD. Follows at [facility]. 7d/ wk, 75kg, 6 exchanges overnight, 2800cc , 1.5h dwell, no daybag - last Hb 12.9 on 8/2, last mircera 6/26 - last pth 1027, phos 9 - no esa or vdra  Assessment/Plan: 1. Hypotension - weaned off pressors. BP remains soft. On midodrine. EF 60-65% on TEE. 2. Leukocytosis/Fever - etiology unclear. WBC was trending down with [facility] & Peritoneal culture w/NGTD, however now with worsening leukocytosis so repeat cultures ordered. Initial concern for endocarditis but TEE with no vegetations.  3. Hypokalemia - Requiring intermittent supplementation. Last K 3.9.  Replete prn.  4. Hyponatremia - improved. Na 132.   5. Hypomagnesemia - Improved. Last Mg 2.2.  6. ESRD - on PD.  Continue nightly.  Using 1.5% bags. Fluid at bedside is clear and straw colored. If patient is going to SNF, will need to transition to HD which patient is not happy about, plans to discuss with her family 7. Anemia of CKD- Hgb 11.8. No indication for ESA.  8. Secondary hyperparathyroidism - Corrected calcium elevated. No Po calcium ordered, Will check PTH, may need sensipar. Phos at goal, continue binders.   9. Volume - Does not appear volume overloaded.  Continue to use 1.5% dialysate.  10. Nutrition - Renal diet w/fluid restrictions.  11. Moderate AAS/mild-mod AI/mod MR - to have OP follow w/cardio 12. B/l LE pain/chronic venous stasis - LE doppler negative for DVT 13. Hypothyroidism - on synthroid 14. Vaginal pain- reportedly chronic, primary team managing    Anice Paganini, PA-C 07/24/2022, 11:51 AM  [facility] Pager: [phone]

## 2022-07-25 DIAGNOSIS — A419 Sepsis, unspecified organism: Secondary | ICD-10-CM | POA: Diagnosis not present

## 2022-07-25 DIAGNOSIS — R6521 Severe sepsis with septic shock: Secondary | ICD-10-CM | POA: Diagnosis not present

## 2022-07-25 LAB — CBC
HCT: 36.6 % (ref 36.0–46.0)
Hemoglobin: 11.9 g/dL — ABNORMAL LOW (ref 12.0–15.0)
MCH: 31.6 pg (ref 26.0–34.0)
MCHC: 32.5 g/dL (ref 30.0–36.0)
MCV: 97.1 fL (ref 80.0–100.0)
Platelets: 275 10*3/uL (ref 150–400)
RBC: 3.77 MIL/uL — ABNORMAL LOW (ref 3.87–5.11)
RDW: 14.6 % (ref 11.5–15.5)
WBC: 23 10*3/uL — ABNORMAL HIGH (ref 4.0–10.5)
nRBC: 0 % (ref 0.0–0.2)

## 2022-07-25 LAB — BASIC METABOLIC PANEL
Anion gap: 15 (ref 5–15)
BUN: 30 mg/dL — ABNORMAL HIGH (ref 8–23)
CO2: 25 mmol/L (ref 22–32)
Calcium: 10 mg/dL (ref 8.9–10.3)
Chloride: 90 mmol/L — ABNORMAL LOW (ref 98–111)
Creatinine, Ser: 6.52 mg/dL — ABNORMAL HIGH (ref 0.44–1.00)
GFR, Estimated: 6 mL/min — ABNORMAL LOW (ref >60)
Glucose, Bld: 134 mg/dL — ABNORMAL HIGH (ref 70–99)
Potassium: 3.6 mmol/L (ref 3.5–5.1)
Sodium: 130 mmol/L — ABNORMAL LOW (ref 135–145)

## 2022-07-25 LAB — PARATHYROID HORMONE, INTACT (NO CA): PTH: 84 pg/mL — ABNORMAL HIGH (ref 15–65)

## 2022-07-25 LAB — GLUCOSE, CAPILLARY
Glucose-Capillary: 119 mg/dL — ABNORMAL HIGH (ref 70–99)
Glucose-Capillary: 164 mg/dL — ABNORMAL HIGH (ref 70–99)
Glucose-Capillary: 96 mg/dL (ref 70–99)
Glucose-Capillary: 155 mg/dL — ABNORMAL HIGH (ref 70–99)

## 2022-07-25 LAB — PATHOLOGIST SMEAR REVIEW: Path Review: INCREASED

## 2022-07-25 LAB — MAGNESIUM: Magnesium: 2 mg/dL (ref 1.7–2.4)

## 2022-07-25 MED ORDER — DIAZEPAM 2 MG PO TABS: 2.0000 mg | ORAL_TABLET | Freq: Every evening | ORAL | Status: DC | PRN

## 2022-07-25 MED ORDER — OXYCODONE HCL 5 MG PO TABS
5.0000 mg | ORAL_TABLET | Freq: Four times a day (QID) | ORAL | Status: DC | PRN
  Administered 2022-07-25 – 2022-07-30 (×12): 5 mg via ORAL
  Filled 2022-07-25 (×12): qty 1

## 2022-07-25 MED ORDER — METOCLOPRAMIDE HCL 5 MG/ML IJ SOLN
5.0000 mg | Freq: Three times a day (TID) | INTRAMUSCULAR | Status: AC
Start: 2022-07-25 — End: 2022-07-27
  Administered 2022-07-25 – 2022-07-26 (×4): 5 mg via INTRAVENOUS
  Filled 2022-07-25 (×5): qty 2

## 2022-07-25 MED ORDER — VITAMIN B-12 1000 MCG PO TABS
1000.0000 ug | ORAL_TABLET | Freq: Every day | ORAL | Status: DC
  Administered 2022-07-25 – 2022-07-30 (×6): 1000 ug via ORAL
  Filled 2022-07-25 (×6): qty 1

## 2022-07-25 MED ORDER — VANCOMYCIN HCL 1750 MG/350ML IV SOLN
1750.0000 mg | Freq: Once | INTRAVENOUS | Status: AC
  Administered 2022-07-25: 1750 mg via INTRAVENOUS
  Filled 2022-07-25: qty 350

## 2022-07-25 MED ORDER — SIMETHICONE 80 MG PO CHEW
80.0000 mg | CHEWABLE_TABLET | Freq: Four times a day (QID) | ORAL | Status: DC
  Administered 2022-07-25 – 2022-07-30 (×23): 80 mg via ORAL
  Filled 2022-07-25 (×25): qty 1

## 2022-07-25 NOTE — Progress Notes (Signed)
[facility] Progress Note   Subjective:   Slow flow reported on PD machine overnight. PD cell count yesterday fortunately not consistent with peritonitis. Pt reports abdominal pain and nausea are a bit better today. Denies SOB, CP, and dizziness. Asks about doing CIR so she can continue PD, but does agree to HD if needs to go to a SNF.   Objective Vitals:   07/24/22 1621 07/24/22 1840 07/24/22 2154 07/25/22 0333  BP: (!) 102/50 (!) 102/56 (!) 97/52 (!) 91/49  Pulse: 69  67 69  Resp: '18 20 19 20  '$ Temp:  98.9 F (37.2 C) 97.7 F (36.5 C) 97.7 F (36.5 C)  TempSrc:  Oral Oral Oral  SpO2: 94% 95% 99% 94%  Weight:      Height:       Physical Exam General: Elderly female, alert, in NAD Heart: RRR, no murmurs, rubs or gallops Lungs: CTA bilaterally without wheezing, rhonchi or rales Abdomen: Soft, mildly distended, +BS Extremities: No edema b/l lower extremities Dialysis Access:  PD cath hooked up to machine  Additional Objective Labs: Basic Metabolic Panel: Recent Labs  Lab 07/22/22 0446 07/24/22 0542 07/25/22 0447  NA 131* 132* 130*  K 3.9 3.2* 3.6  CL 94* 92* 90*  CO2 '23 26 25  '$ GLUCOSE 131* 174* 134*  BUN 35* 28* 30*  CREATININE 7.10* 6.34* 6.52*  CALCIUM 9.6 10.4* 10.0  PHOS 4.5 5.1*  --    Liver Function Tests: Recent Labs  Lab 07/22/22 0446 07/24/22 0542  ALBUMIN 2.0* 2.2*   No results for input(s): "LIPASE", "AMYLASE" in the last 168 hours. CBC: Recent Labs  Lab 07/21/22 0947 07/22/22 0446 07/23/22 0515 07/24/22 0542 07/25/22 0447  WBC 13.2* 12.9* 19.7* 24.9* 23.0*  NEUTROABS  --   --   --  20.9*  --   HGB 10.8* 11.1* 11.9* 11.8* 11.9*  HCT 32.5* 34.8* 37.0 36.1 36.6  MCV 96.7 98.0 97.9 97.3 97.1  PLT 182 189 240 275 275   Blood Culture    Component Value Date/Time   SDES FLUID PERITONEAL 07/24/2022 1549   SDES FLUID PERITONEAL 07/24/2022 1549   SPECREQUEST BOTTLES DRAWN AEROBIC AND ANAEROBIC DIALYSATE 07/24/2022 1549    SPECREQUEST DIALYSIS 07/24/2022 1549   CULT  07/24/2022 1549    NO GROWTH < 24 HOURS Performed at [facility], [place] [address], [place], [place] [address]    REPTSTATUS PENDING 07/24/2022 1549   REPTSTATUS 07/24/2022 FINAL 07/24/2022 1549    Cardiac Enzymes: No results for input(s): "CKTOTAL", "CKMB", "CKMBINDEX", "TROPONINI" in the last 168 hours. CBG: Recent Labs  Lab 07/24/22 0730 07/24/22 1158 07/24/22 1620 07/24/22 2158 07/25/22 0736  GLUCAP 161* 131* 104* 106* 119*   Iron Studies: No results for input(s): "IRON", "TIBC", "TRANSFERRIN", "FERRITIN" in the last 72 hours. '@lablastinr3'$ @ Studies/Results: CT ABDOMEN PELVIS WO CONTRAST  Result Date: 07/24/2022 CLINICAL DATA:  Nausea and vomiting. Suspected bowel obstruction and peritonitis. On peritoneal dialysis. EXAM: CT ABDOMEN AND PELVIS WITHOUT CONTRAST TECHNIQUE: Multidetector CT imaging of the abdomen and pelvis was performed following the standard protocol without IV contrast. RADIATION DOSE REDUCTION: This exam was performed according to the departmental dose-optimization program which includes automated exposure control, adjustment of the mA and/or kV according to patient size and/or use of iterative reconstruction technique. COMPARISON:  07/29/2022 FINDINGS: Lower chest: 2.2 x 1.6 cm pulmonary nodule seen in the posterior lingula, suspicious for neoplasm. Tiny bilateral pleural effusions also noted. Hepatobiliary: No mass visualized on this unenhanced exam.  Prior cholecystectomy. No evidence of biliary obstruction. Pancreas: No mass or inflammatory process visualized on this unenhanced exam. Spleen:  Within normal limits in size. Adrenals/Urinary tract: Bilateral renal parenchymal atrophy again demonstrated, consistent chronic renal failure. Multiple small less than 1 cm renal calculi are again seen bilaterally. Stable right renal pelvicaliectasis without evidence of ureteral calculi or dilatation. 1 cm fat attenuation lesion  in upper pole of left kidney is stable, consistent with benign angiomyolipoma (no followup imaging recommended). Unremarkable unopacified urinary bladder. Stomach/Bowel: Mild diffuse colonic and small bowel dilatation is seen without transition point, consistent with mild ileus. No evidence of bowel wall thickening or focal inflammatory process. Vascular/Lymphatic: No pathologically enlarged lymph nodes identified. No evidence of abdominal aortic aneurysm. Aortic atherosclerotic calcification incidentally noted. Reproductive:  No mass or other significant abnormality. Other: Peritoneal dialysis catheter is seen within the pelvis, with mild moderate ascites and tiny amount of free intraperitoneal air noted. Musculoskeletal:  No suspicious bone lesions identified. IMPRESSION: Mild diffuse colonic and small bowel dilatation, consistent with ileus. No evidence of bowel obstruction or focal inflammatory process. Peritoneal dialysis catheter, with mild-to-moderate ascites and tiny amount of free intraperitoneal air. Bilateral nephrolithiasis. Stable right renal pelvicaliectasis without evidence of ureteral calculi or dilatation. Stable small benign left renal angiomyolipoma. 2.2 cm pulmonary nodule in posterior lingula, suspicious for neoplasm. Chest CT is recommended for further evaluation. Electronically Signed   By: Marlaine Hind M.D.   On: 07/24/2022 15:08   DG Chest 2 View  Result Date: 07/23/2022 CLINICAL DATA:  Leukocytosis. EXAM: CHEST - 2 VIEW COMPARISON:  July 13, 2022. FINDINGS: Stable cardiomediastinal silhouette. Left-sided pacemaker is unchanged in position. Small left pleural effusion is noted with associated left basilar atelectasis or infiltrate. Right lung is unremarkable. Bony thorax is unremarkable. IMPRESSION: Small left pleural effusion is noted with associated left basilar atelectasis or infiltrate. Electronically Signed   By: Marijo Conception M.D.   On: 07/23/2022 15:25   Medications:  sodium  chloride Stopped (07/14/22 0109)   sodium chloride Stopped (07/19/22 0400)   dialysis solution 1.5% low-MG/low-CA      clotrimazole  1 Applicatorful Vaginal QHS   cyanocobalamin  1,000 mcg Oral Daily   escitalopram  5 mg Oral Daily   ezetimibe  10 mg Oral Daily   ferric citrate  420 mg Oral TID WC   gentamicin cream  1 Application Topical Daily   heparin injection (subcutaneous)  5,000 Units Subcutaneous Q8H   insulin aspart  0-6 Units Subcutaneous TID WC   levothyroxine  75 mcg Oral QAC breakfast   midodrine  10 mg Oral TID with meals   pantoprazole  40 mg Oral Daily   polyethylene glycol  17 g Oral Daily   senna  1 tablet Oral BID   simethicone  80 mg Oral QID   simvastatin  40 mg Oral QHS   sodium bicarbonate  650 mg Oral BID    Dialysis Orders: CCPD. Follows at [facility]. 7d/ wk, 75kg, 6 exchanges overnight, 2800cc , 1.5h dwell, no daybag - last Hb 12.9 on 8/2, last mircera 6/26 - last pth 1027, phos 9 - no esa or vdra  Assessment/Plan: 1. Hypotension - weaned off pressors. BP remains soft. On midodrine. EF 60-65% on TEE. 2. Leukocytosis/Fever - etiology unclear. WBC was trending down with [facility] & Peritoneal culture w/NGTD, however now with worsening leukocytosis. Initial concern for endocarditis but TEE with no vegetations. PD cell count not consistent with peritonitis 3. Hypokalemia - Requiring intermittent supplementation.  Last K 3.6.  Replete prn.  4. Hyponatremia - stable, Na 130 5. Hypomagnesemia - Improved. Last Mg 2.2.  6. ESRD - on PD.  Continue nightly.  Using 1.5% bags. Fluid at bedside is clear and straw colored. If patient is going to SNF, will need to transition to HD which patient says she will do if necessary, but prefers CIR so she can keep doing PD. I have let her primary team know.  7. Anemia of CKD- Hgb 11.9. No indication for ESA.  8. Secondary hyperparathyroidism - Corrected calcium elevated. No Po calcium ordered, Will check PTH, may need sensipar. Phos at goal,  continue binders.   9. Volume - Does not appear volume overloaded.  Continue to use 1.5% dialysate.  10. Nutrition - Renal diet w/fluid restrictions.  11. Moderate AAS/mild-mod AI/mod MR - to have OP follow w/cardio 12. B/l LE pain/chronic venous stasis - LE doppler negative for DVT 13. Hypothyroidism - on synthroid 14. Vaginal pain- reportedly chronic, primary team managing    Anice Paganini, PA-C 07/25/2022, 9:20 AM  [facility] Pager: [phone]

## 2022-07-25 NOTE — Progress Notes (Signed)
Pt's PD machine had trouble. The message on the machine read "Slow flow". The charged nurse attempted several times to fix the problem. The HD nurse came to the room twice to assess the situation. At 0320, the machine was working again.

## 2022-07-25 NOTE — Plan of Care (Signed)
  Problem: Activity: Goal: Risk for activity intolerance will decrease Outcome: Progressing   

## 2022-07-25 NOTE — Progress Notes (Signed)
Pharmacy Antibiotic Note  Erika Fry is a 76 y.o. female admitted on 07/12/2022 with sepsis. Of note, patient is ESRD on PD. Fluid cell count on 8/23 low, but patient now growing GPCs in clusters in peritoneal fluid culture.TEE without vegetation this admission. Pharmacy has been consulted for Vancomycin dosing.  Plan: START Vancomycin 1,750 mg IV x1  F/U vancomycin random level in 3-5 days to redose based on peritoneal fluid cx  F/U C/S, UOP, levels as indicated   Height: '5\' 5"'$  (165.1 cm) Weight: 77.6 kg (171 lb 1.2 oz) IBW/kg (Calculated) : 57  Temp (24hrs), Avg:98.1 F (36.7 C), Min:97.7 F (36.5 C), Max:98.9 F (37.2 C)  Recent Labs  Lab 07/20/22 0936 07/21/22 0947 07/22/22 0446 07/23/22 0515 07/24/22 0542 07/25/22 0447  WBC 11.8* 13.2* 12.9* 19.7* 24.9* 23.0*  CREATININE 6.58* 6.68* 7.10*  --  6.34* 6.52*    Estimated Creatinine Clearance: 7.3 mL/min (A) (by C-G formula based on SCr of 6.52 mg/dL (H)).    Allergies  Allergen Reactions   Lioresal [Baclofen] Other (See Comments)    Somnolence "Put me into a coma and almost killed me"    Tape Other (See Comments)    Very sensitive skin, will tear easliy   Other Other (See Comments)    Patient has a high tolerance to antibiotics- has taken a lot of them during the course of her life   Codeine Other (See Comments)    Increased pain and couldn't sleep   Robitussin Cold Cough+ Chest [Dextromethorphan-Guaifenesin] Other (See Comments)    Unknown reaction    Antimicrobials this admission: cefepime 8/12 >> 8/18 Vancomycin 8/12 >>8/15 Fluc x1 8/20   Microbiology results: 8/22 BCx: NGTD 8/23 peritoneal fluid cx: GPCs in clusters  Thank you for allowing pharmacy to be a part of this patient's care.  Adria Dill, PharmD PGY-2 Infectious Diseases Resident  07/25/2022 2:36 PM

## 2022-07-25 NOTE — Progress Notes (Signed)
Progress Note Patient: Erika Fry JGG:[id number] DOB: [birth year] DOA: 07/10/2022  DOS: the patient was seen and examined on 07/25/2022  Brief hospital course: 76 year old female with pertinent PMH of ESRD on peritoneal dialysis, DMT2, anemia, thrombocytopenia, HFpEF, pacemaker in place presents to [facility] ED on 8/12 with generalized weakness. Found to have shock possibly septic shock.  There is also concern for peritonitis.  Treated with IV antibiotics.  Now appears to have developed ileus as of 8/23. Assessment and Plan: Septic shock presented with fatigue, generalized weakness, met SIRS criteria on admission with leukocytosis and hypotension. Treated with IV cefepime and vancomycin on admission and later on with cefepime alone as well as fluconazole. Cultures so far negative. Received IV Levophed at the time of admission and currently on midodrine. TTE was concerning for possible endocarditis.  TEE was negative. Blood cultures so far negative. Continue midodrine.  Possible peritonitis. Due to abdominal pain and worsening leukocytosis on 8/23 peritoneal fluid was sent again for culture. Peritoneal fluid culture growing gram-positive cocci. Given that the patient has abdominal pain, ileus as well as leukocytosis we will initiate the patient on IV vancomycin. Monitor.  Ileus. Etiology not clear. We will continue to monitor. Initiate scheduled Reglan.  Hypomagnesemia/hypokalemia/hyponatremia Currently undergoing peritoneal dialysis per nephrology. Electrolyte management per nephrology.   Moderate AAS/mild to moderate AI/moderate MR Outpatient follow-up with cardiology.   Vaginal pain Continue current pain regimen. -Outpatient follow-up.   Chronic anemia secondary to ESRD Patient likely on presentation with hemoconcentration. Hemoglobin currently stable -Per nephrology.   ESRD on CCPD Currently on nightly PD. -Per nephrology. -Will need HD if needs to go to SNF.    Endocarditis ruled out 2D echo done on presentation with concern for thrombus on pacemaker lead and ascending aorta. Patient seen in consultation by cardiology, underwent TEE which was negative for vegetations.   Bilateral chronic venous stasis -Lower extremity Dopplers negative for DVT.   Hypothyroidism -Synthroid.    Vaginal candidiasis -Status post Diflucan 150 mg p.o. x1. -Continue clotrimazole vaginal cream daily nightly.   Constipation Continue MiraLAX.  Continue simethicone, continue scheduled Reglan.   Subjective: Abdominal pain still present.  No nausea or vomiting.  No fever no chills.  Physical Exam: Vitals:   07/24/22 2154 07/25/22 0333 07/25/22 0938 07/25/22 1701  BP: (!) 97/52 (!) 91/49 (!) 96/55 (!) 96/51  Pulse:  69 71 77  Resp:  20 20 18   Temp:  97.7 F (36.5 C) 97.8 F (36.6 C) (!) 97.5 F (36.4 C)  TempSrc: Oral Oral Oral   SpO2: 99% 94% 95% 99%  Weight:      Height:       General: Appear in mild distress; no visible Abnormal Neck Mass Or lumps, Conjunctiva normal Cardiovascular: S1 and S2 Present, aortic systolic  Murmur, Respiratory: good respiratory effort, Bilateral Air entry present and CTA, no Crackles, no wheezes Abdomen: Bowel Sound present, lower abdomen tender Extremities: no Pedal edema Neurology: alert and oriented to Self, Place and time  Data Reviewed: I have Reviewed nursing notes, Vitals, and Lab results since pt's last encounter. Pertinent lab results CBC and BMP I have ordered test including CBC and BMP I have discussed pt's care plan and test results with nephrology.   Family Communication: No one at bedside  Disposition: Status is: Inpatient Remains inpatient appropriate because: Currently being treated for peritonitis with IV antibiotics.  Also has ileus.  Author: Berle Mull, MD 07/25/2022 7:58 PM  Please look on [email or web address] to find out who is  on call.

## 2022-07-25 NOTE — Progress Notes (Signed)
PT Cancellation Note  Patient Details Name: Erika Fry MRN: [id number] DOB: [birth year]   Cancelled Treatment:    Reason Eval/Treat Not Completed: Patient at procedure or test/unavailable  Patient undergoing peritoneal dialysis.    Arby Barrette, PT Acute Rehabilitation Services  Office [phone]   Rexanne Mano 07/25/2022, 3:07 PM

## 2022-07-26 DIAGNOSIS — R6521 Severe sepsis with septic shock: Secondary | ICD-10-CM | POA: Diagnosis not present

## 2022-07-26 DIAGNOSIS — A419 Sepsis, unspecified organism: Secondary | ICD-10-CM | POA: Diagnosis not present

## 2022-07-26 LAB — RENAL FUNCTION PANEL
Albumin: 2.1 g/dL — ABNORMAL LOW (ref 3.5–5.0)
Anion gap: 18 — ABNORMAL HIGH (ref 5–15)
BUN: 31 mg/dL — ABNORMAL HIGH (ref 8–23)
CO2: 20 mmol/L — ABNORMAL LOW (ref 22–32)
Calcium: 10 mg/dL (ref 8.9–10.3)
Chloride: 92 mmol/L — ABNORMAL LOW (ref 98–111)
Creatinine, Ser: 5.91 mg/dL — ABNORMAL HIGH (ref 0.44–1.00)
GFR, Estimated: 7 mL/min — ABNORMAL LOW (ref >60)
Glucose, Bld: 131 mg/dL — ABNORMAL HIGH (ref 70–99)
Phosphorus: 4.4 mg/dL (ref 2.5–4.6)
Potassium: 3.7 mmol/L (ref 3.5–5.1)
Sodium: 130 mmol/L — ABNORMAL LOW (ref 135–145)

## 2022-07-26 LAB — CBC
HCT: 39.5 % (ref 36.0–46.0)
Hemoglobin: 12.7 g/dL (ref 12.0–15.0)
MCH: 31.7 pg (ref 26.0–34.0)
MCHC: 32.2 g/dL (ref 30.0–36.0)
MCV: 98.5 fL (ref 80.0–100.0)
Platelets: 270 10*3/uL (ref 150–400)
RBC: 4.01 MIL/uL (ref 3.87–5.11)
RDW: 14.6 % (ref 11.5–15.5)
WBC: 27.1 10*3/uL — ABNORMAL HIGH (ref 4.0–10.5)
nRBC: 0 % (ref 0.0–0.2)

## 2022-07-26 LAB — GLUCOSE, CAPILLARY
Glucose-Capillary: 131 mg/dL — ABNORMAL HIGH (ref 70–99)
Glucose-Capillary: 102 mg/dL — ABNORMAL HIGH (ref 70–99)
Glucose-Capillary: 109 mg/dL — ABNORMAL HIGH (ref 70–99)
Glucose-Capillary: 100 mg/dL — ABNORMAL HIGH (ref 70–99)

## 2022-07-26 LAB — PARATHYROID HORMONE, INTACT (NO CA): PTH: 67 pg/mL — ABNORMAL HIGH (ref 15–65)

## 2022-07-26 LAB — MAGNESIUM: Magnesium: 1.8 mg/dL (ref 1.7–2.4)

## 2022-07-26 MED ORDER — VANCOMYCIN VARIABLE DOSE PER UNSTABLE RENAL FUNCTION (PHARMACIST DOSING): Status: DC

## 2022-07-26 MED ORDER — ALTEPLASE 2 MG IJ SOLR
2.0000 mg | Freq: Once | INTRAMUSCULAR | Status: AC
  Administered 2022-07-26: 2 mg
  Filled 2022-07-26: qty 2

## 2022-07-26 MED ORDER — HEPARIN SODIUM (PORCINE) 1000 UNIT/ML IJ SOLN
INTRAPERITONEAL | Status: DC
  Filled 2022-07-26 (×19): qty 6000

## 2022-07-26 MED ORDER — DELFLEX-LC/1.5% DEXTROSE 344 MOSM/L IP SOLN
INTRAPERITONEAL | Status: DC
  Administered 2022-07-28: 6000 mL via INTRAPERITONEAL

## 2022-07-26 NOTE — Progress Notes (Signed)
07/26/2022 8:50 PM  76M staff informed this Probation officer about PD complications. Informed Dr. Joelyn Oms. New verbal orders given. Alerted both attending RN on 76M and night shift HD RN.   Samarie Pinder MSN, RN-BC, CNML Phone: [phone]

## 2022-07-26 NOTE — Progress Notes (Signed)
Physical Therapy Treatment Patient Details Name: Erika Fry MRN: [id number] DOB: [birth year] Today's Date: 07/26/2022   History of Present Illness 76 year old female presents to [facility] ED on 8/12 with generalized weakness.  Pt with septic shock, hypotension and questionable vegetation on pacer lead in aorta.  Pt for TEE 8/15.  PMH of ESRD on peritoneal dialysis, DMT2, anemia, thrombocytopenia, HFpEF, pacemaker    PT Comments    Pt was seen for mobility on bed with help to roll for bedpan and then for there exercise.  Pt is fairly passive with movement today, and required a lot of assist for completion of tasks.  Follow up with her to get OOB with two person help, and encourage her to sit up for a time to build endurance.  Follow for acute PT goals, and increase her sitting and standing tolerance as she allows.  SNF recommended due to depth of dependence on help to move, as well as limited help for direct return home.   Recommendations for follow up therapy are one component of a multi-disciplinary discharge planning process, led by the attending physician.  Recommendations may be updated based on patient status, additional functional criteria and insurance authorization.  Follow Up Recommendations  Skilled nursing-short term rehab (<3 hours/day) Can patient physically be transported by private vehicle: No   Assistance Recommended at Discharge Frequent or constant Supervision/Assistance  Patient can return home with the following Two people to help with walking and/or transfers;Two people to help with bathing/dressing/bathroom;Assistance with cooking/housework;Direct supervision/assist for financial management;Direct supervision/assist for medications management;Assist for transportation;Help with stairs or ramp for entrance   Equipment Recommendations  None recommended by PT    Recommendations for Other Services       Precautions / Restrictions Precautions Precautions:  Fall Precaution Comments: PD Restrictions Weight Bearing Restrictions: No     Mobility  Bed Mobility Overal bed mobility: Needs Assistance Bed Mobility: Rolling Rolling: Mod assist         General bed mobility comments: more help needed than last visit    Transfers                   General transfer comment: tired to try this    Ambulation/Gait                   Stairs             Wheelchair Mobility    Modified Rankin (Stroke Patients Only)       Balance     Sitting balance-Leahy Scale: Poor                                      Cognition Arousal/Alertness: Lethargic Behavior During Therapy: Flat affect Overall Cognitive Status: No family/caregiver present to determine baseline cognitive functioning                                 General Comments: pt is unaware of the amount of assistance needed to stand up and move today        Exercises General Exercises - Lower Extremity Ankle Circles/Pumps: AAROM, 5 reps Heel Slides: AAROM, 10 reps Hip ABduction/ADduction: AAROM, 10 reps Straight Leg Raises: AAROM, 10 reps Hip Flexion/Marching: AAROM, 10 reps    General Comments General comments (skin integrity, edema, etc.): Assisted pt to get up and move to  roll, then passively moved legs to exercise.  Pt is providing limited help to do any moving      Pertinent Vitals/Pain Pain Assessment Pain Assessment: Faces Faces Pain Scale: Hurts little more Pain Location: LEs with movement Pain Descriptors / Indicators: Grimacing, Guarding Pain Intervention(s): Limited activity within patient's tolerance, Monitored during session, Premedicated before session, Repositioned    Home Living                          Prior Function            PT Goals (current goals can now be found in the care plan section) Acute Rehab PT Goals Patient Stated Goal: to go home Progress towards PT goals: Not  progressing toward goals - comment    Frequency    Min 3X/week      PT Plan Current plan remains appropriate    Co-evaluation              AM-PAC PT "6 Clicks" Mobility   Outcome Measure  Help needed turning from your back to your side while in a flat bed without using bedrails?: A Lot Help needed moving from lying on your back to sitting on the side of a flat bed without using bedrails?: A Lot Help needed moving to and from a bed to a chair (including a wheelchair)?: Total Help needed standing up from a chair using your arms (e.g., wheelchair or bedside chair)?: Total Help needed to walk in hospital room?: Total Help needed climbing 3-5 steps with a railing? : Total 6 Click Score: 8    End of Session   Activity Tolerance: Patient limited by fatigue;Treatment limited secondary to medical complications (Comment) Patient left: in bed;with call bell/phone within reach;with bed alarm set Nurse Communication: Mobility status PT Visit Diagnosis: Muscle weakness (generalized) (M62.81);Difficulty in walking, not elsewhere classified (R26.2)     Time: 8144-8185 PT Time Calculation (min) (ACUTE ONLY): 18 min  Charges:  $Therapeutic Exercise: 8-22 mins    Ramond Dial 07/26/2022, 4:36 PM  Mee Hives, PT PhD Acute Rehab Dept. Number: [place] and [place]

## 2022-07-26 NOTE — Progress Notes (Signed)
Called to pt room by floor nurse. PD cycler machine alarm blockage detected. Attempted x2 to restart machine and alarm continues. To alarm. Alerted CN.

## 2022-07-26 NOTE — Progress Notes (Signed)
Progress Note Patient: Erika Fry QIO:[id number] DOB: 04-06-1946 DOA: 07/03/2022  DOS: the patient was seen and examined on 07/26/2022  Brief hospital course: 76 year old female with pertinent PMH of ESRD on peritoneal dialysis, DMT2, anemia, thrombocytopenia, HFpEF, pacemaker in place presents to [facility] ED on 8/12 with generalized weakness. Found to have shock possibly septic shock.  There is also concern for peritonitis.  Treated with IV antibiotics.  Now appears to have developed ileus along with peritonitis from peritoneal catheter as of 8/23.  Currently on IV vancomycin. Assessment and Plan: Septic shock presented with fatigue, generalized weakness, met SIRS criteria on admission with leukocytosis and hypotension. Treated with IV cefepime and vancomycin on admission and later on with cefepime alone as well as fluconazole. Cultures so far negative. Received IV Levophed at the time of admission and currently on midodrine. TTE was concerning for possible endocarditis.  TEE was negative. Blood cultures so far negative. Continue midodrine.  Possible peritonitis. Due to abdominal pain and worsening leukocytosis on 8/23 peritoneal fluid was sent again for culture. Peritoneal fluid culture growing gram-positive cocci. Given that the patient has abdominal pain, ileus as well as leukocytosis we will initiate the patient on IV vancomycin. Monitor.  WBC worsening.  May require removal of her PD catheter if continues to have lack of response.  Ileus. Etiology not clear. We will continue to monitor. Initiate scheduled Reglan.  Hypomagnesemia/hypokalemia/hyponatremia Currently undergoing peritoneal dialysis per nephrology. Electrolyte management per nephrology.   Moderate AAS/mild to moderate AI/moderate MR Outpatient follow-up with cardiology.   Vaginal pain Continue current pain regimen. -Outpatient follow-up.   Chronic anemia secondary to ESRD Patient likely on presentation with  hemoconcentration. Hemoglobin currently stable -Per nephrology.   ESRD on CCPD Currently on nightly PD. -Per nephrology. -Will need HD if needs to go to SNF.   Endocarditis ruled out 2D echo done on presentation with concern for thrombus on pacemaker lead and ascending aorta. Patient seen in consultation by cardiology, underwent TEE which was negative for vegetations.   Bilateral chronic venous stasis -Lower extremity Dopplers negative for DVT.   Hypothyroidism -Synthroid.    Vaginal candidiasis -Status post Diflucan 150 mg p.o. x1. -Continue clotrimazole vaginal cream daily nightly.   Constipation Continue MiraLAX.  Continue simethicone, continue scheduled Reglan.  Disposition. We will consult CIR for consideration for rehab as the patient is motivated and wants to continue PD instead of transitioning to HD.   Subjective: Abdominal pain unchanged and still present.  No nausea no vomiting no fever no chills.  Oral intake is still adequate.  No other acute change.  Physical Exam: Vitals:   07/26/22 1023 07/26/22 1103 07/26/22 1701 07/26/22 1839  BP: (!) 96/53 (!) 99/51 (!) 94/56 (!) 98/58  Pulse: 71  78   Resp:   17 18  Temp: 98.1 F (36.7 C) 99.7 F (37.6 C) 98.1 F (36.7 C) 98.5 F (36.9 C)  TempSrc: Oral Oral  Oral  SpO2: 97% 97% 96% 96%  Weight:      Height:       General: Appear in mild distress; no visible Abnormal Neck Mass Or lumps, Conjunctiva normal Cardiovascular: S1 and S2 Present, aortic systolic  Murmur, Respiratory: good respiratory effort, Bilateral Air entry present and CTA, no Crackles, no wheezes Abdomen: Bowel Sound present, diffuse lower abdominal tender Extremities: no Pedal edema Neurology: alert and oriented to Self, Place and time.  Data Reviewed: I have Reviewed nursing notes, Vitals, and Lab results since pt's last encounter. Pertinent lab results  CBC and BMP I have ordered test including CBC and BMP I have ordered imaging studies  x-ray abdomen. I have discussed pt's care plan and test results with nephrology.    Family Communication: No one at bedside  Disposition: Status is: Inpatient Remains inpatient appropriate because: Currently being treated for peritonitis with IV antibiotics.  Also has ileus.  Author: Berle Mull, MD 07/26/2022 7:58 PM  Please look on [email or web address] to find out who is on call.

## 2022-07-26 NOTE — Progress Notes (Signed)
Subjective: Seen in room, unfortunately had to be taken off PD because PD machine was not performing well secondary to need of heparin.  Will write orders for heparin use tonight.  Patient states abdominal discomfort is improving.  However noted PD fluids gram-positive cocci growing.  Informed this to patient.  She wants to give PD another try before changing to hemo-.  No word from in center rehab yet  Objective Vital signs in last 24 hours: Vitals:   07/25/22 1701 07/25/22 2025 07/26/22 0548 07/26/22 1023  BP: (!) 96/51 (!) 101/56 (!) 113/55 (!) 96/53  Pulse: 77 73 75 71  Resp: '18 18 17   '$ Temp: (!) 97.5 F (36.4 C) 97.7 F (36.5 C) 98.4 F (36.9 C) 98.1 F (36.7 C)  TempSrc:   Oral Oral  SpO2: 99% 98% 100% 97%  Weight:      Height:       Weight change:   Physical Exam: General: Elderly female alert NAD Heart: RRR no MRG Lungs: CTA bilaterally nonlabored breathing Abdomen: Obese, NABS, soft, NT ND Extremities: No pedal edema  dialysis Access: PD cath in place nontender, no discharge noted, positive bruit right arm basilic vein AV fistula   Dialysis Orders: CCPD. Follows at [facility]. 7d/ wk, 75kg, 6 exchanges overnight, 2800cc , 1.5h dwell, no daybag - last Hb 12.9 on 8/2, last mircera 6/26 - last pth 1027, phos 9 - no esa or vdra    Problem/Plan: Hypotension-now off pressors.  BP soft on midodrine 10 mg 3 times daily, noted EF 60 to 65% on TEE Gram-positive cocci peritonitis leukocytosis= now afebrile.  WBC 28,000 TEE negative , on vancomycin, if has continued abdominal pain elevated WBC despite antibiotics may need to have PD catheter removed however patient wants to treat with antibiotics for now ESRD -currently on CCPD use 1.5 bags use heparin now as she uses at home.  As noted patient will change to HD if needed for outpatient rehab but prefers PD hopefully CIR will accept.  Will DC sodium bicarb even with CO2 20 as she is on PD follow-up trend HTN/volume -does not appear to  be volume overloaded, however noted sodium 130, using 0.5% PD bags BP soft on midodrine O2 sat 97% on room air Moderate AAS/mild to moderate AI/moderate MR= cardiology to follow-up Anemia -Hgb 12.7 no ESA needs Secondary hyperparathyroidism -calcium corrected over 10 phosphorus 4.4, using Auryxia, PTH was 84= 07/24/22 Hypomagnesia= improved last Mg 2.2 9    bilateral lower extremity pain chronic venous stasis changes= had negative lower extremity Doppler for DVT   Ernest Haber, PA-C [facility] Bee[facility] [phone] 07/26/2022,12:00 PM  LOS: 13 days   Labs: Basic Metabolic Panel: Recent Labs  Lab 07/22/22 0446 07/24/22 0542 07/25/22 0447 07/26/22 0613  NA 131* 132* 130* 130*  K 3.9 3.2* 3.6 3.7  CL 94* 92* 90* 92*  CO2 '23 26 25 '$ 20*  GLUCOSE 131* 174* 134* 131*  BUN 35* 28* 30* 31*  CREATININE 7.10* 6.34* 6.52* 5.91*  CALCIUM 9.6 10.4* 10.0 10.0  PHOS 4.5 5.1*  --  4.4   Liver Function Tests: Recent Labs  Lab 07/22/22 0446 07/24/22 0542 07/26/22 0613  ALBUMIN 2.0* 2.2* 2.1*   No results for input(s): "LIPASE", "AMYLASE" in the last 168 hours. No results for input(s): "AMMONIA" in the last 168 hours. CBC: Recent Labs  Lab 07/22/22 0446 07/23/22 0515 07/24/22 0542 07/25/22 0447 07/26/22 0613  WBC 12.9* 19.7* 24.9* 23.0* 27.1*  NEUTROABS  --   --  20.9*  --   --   HGB 11.1* 11.9* 11.8* 11.9* 12.7  HCT 34.8* 37.0 36.1 36.6 39.5  MCV 98.0 97.9 97.3 97.1 98.5  PLT 189 240 275 275 270   Cardiac Enzymes: No results for input(s): "CKTOTAL", "CKMB", "CKMBINDEX", "TROPONINI" in the last 168 hours. CBG: Recent Labs  Lab 07/25/22 1117 07/25/22 1701 07/25/22 2151 07/26/22 0723 07/26/22 1141  GLUCAP 164* 96 155* 131* 102*    Studies/Results: CT ABDOMEN PELVIS WO CONTRAST  Result Date: 07/24/2022 CLINICAL DATA:  Nausea and vomiting. Suspected bowel obstruction and peritonitis. On peritoneal dialysis. EXAM: CT ABDOMEN AND PELVIS WITHOUT CONTRAST  TECHNIQUE: Multidetector CT imaging of the abdomen and pelvis was performed following the standard protocol without IV contrast. RADIATION DOSE REDUCTION: This exam was performed according to the departmental dose-optimization program which includes automated exposure control, adjustment of the mA and/or kV according to patient size and/or use of iterative reconstruction technique. COMPARISON:  07/06/2022 FINDINGS: Lower chest: 2.2 x 1.6 cm pulmonary nodule seen in the posterior lingula, suspicious for neoplasm. Tiny bilateral pleural effusions also noted. Hepatobiliary: No mass visualized on this unenhanced exam. Prior cholecystectomy. No evidence of biliary obstruction. Pancreas: No mass or inflammatory process visualized on this unenhanced exam. Spleen:  Within normal limits in size. Adrenals/Urinary tract: Bilateral renal parenchymal atrophy again demonstrated, consistent chronic renal failure. Multiple small less than 1 cm renal calculi are again seen bilaterally. Stable right renal pelvicaliectasis without evidence of ureteral calculi or dilatation. 1 cm fat attenuation lesion in upper pole of left kidney is stable, consistent with benign angiomyolipoma (no followup imaging recommended). Unremarkable unopacified urinary bladder. Stomach/Bowel: Mild diffuse colonic and small bowel dilatation is seen without transition point, consistent with mild ileus. No evidence of bowel wall thickening or focal inflammatory process. Vascular/Lymphatic: No pathologically enlarged lymph nodes identified. No evidence of abdominal aortic aneurysm. Aortic atherosclerotic calcification incidentally noted. Reproductive:  No mass or other significant abnormality. Other: Peritoneal dialysis catheter is seen within the pelvis, with mild moderate ascites and tiny amount of free intraperitoneal air noted. Musculoskeletal:  No suspicious bone lesions identified. IMPRESSION: Mild diffuse colonic and small bowel dilatation, consistent with  ileus. No evidence of bowel obstruction or focal inflammatory process. Peritoneal dialysis catheter, with mild-to-moderate ascites and tiny amount of free intraperitoneal air. Bilateral nephrolithiasis. Stable right renal pelvicaliectasis without evidence of ureteral calculi or dilatation. Stable small benign left renal angiomyolipoma. 2.2 cm pulmonary nodule in posterior lingula, suspicious for neoplasm. Chest CT is recommended for further evaluation. Electronically Signed   By: Marlaine Hind M.D.   On: 07/24/2022 15:08   Medications:  sodium chloride Stopped (07/14/22 0109)   sodium chloride Stopped (07/19/22 0400)   dialysis solution 1.5% low-MG/low-CA      clotrimazole  1 Applicatorful Vaginal QHS   cyanocobalamin  1,000 mcg Oral Daily   escitalopram  5 mg Oral Daily   ezetimibe  10 mg Oral Daily   ferric citrate  420 mg Oral TID WC   gentamicin cream  1 Application Topical Daily   heparin injection (subcutaneous)  5,000 Units Subcutaneous Q8H   insulin aspart  0-6 Units Subcutaneous TID WC   levothyroxine  75 mcg Oral QAC breakfast   metoCLOPramide (REGLAN) injection  5 mg Intravenous Q8H   midodrine  10 mg Oral TID with meals   pantoprazole  40 mg Oral Daily   polyethylene glycol  17 g Oral Daily   simethicone  80 mg Oral QID   simvastatin  40  mg Oral QHS   sodium bicarbonate  650 mg Oral BID

## 2022-07-27 ENCOUNTER — Inpatient Hospital Stay (HOSPITAL_COMMUNITY): Payer: Medicare Other

## 2022-07-27 DIAGNOSIS — R6521 Severe sepsis with septic shock: Secondary | ICD-10-CM | POA: Diagnosis not present

## 2022-07-27 DIAGNOSIS — A419 Sepsis, unspecified organism: Secondary | ICD-10-CM | POA: Diagnosis not present

## 2022-07-27 LAB — GLUCOSE, CAPILLARY
Glucose-Capillary: 100 mg/dL — ABNORMAL HIGH (ref 70–99)
Glucose-Capillary: 132 mg/dL — ABNORMAL HIGH (ref 70–99)
Glucose-Capillary: 155 mg/dL — ABNORMAL HIGH (ref 70–99)
Glucose-Capillary: 154 mg/dL — ABNORMAL HIGH (ref 70–99)

## 2022-07-27 LAB — CBC WITH DIFFERENTIAL/PLATELET
Abs Immature Granulocytes: 0.17 10*3/uL — ABNORMAL HIGH (ref 0.00–0.07)
Basophils Absolute: 0.2 10*3/uL — ABNORMAL HIGH (ref 0.0–0.1)
Basophils Relative: 1 %
Eosinophils Absolute: 0.3 10*3/uL (ref 0.0–0.5)
Eosinophils Relative: 2 %
HCT: 31 % — ABNORMAL LOW (ref 36.0–46.0)
Hemoglobin: 10.1 g/dL — ABNORMAL LOW (ref 12.0–15.0)
Immature Granulocytes: 1 %
Lymphocytes Relative: 16 %
Lymphs Abs: 3.3 10*3/uL (ref 0.7–4.0)
MCH: 31.6 pg (ref 26.0–34.0)
MCHC: 32.6 g/dL (ref 30.0–36.0)
MCV: 96.9 fL (ref 80.0–100.0)
Monocytes Absolute: 1.7 10*3/uL — ABNORMAL HIGH (ref 0.1–1.0)
Monocytes Relative: 8 %
Neutro Abs: 15.3 10*3/uL — ABNORMAL HIGH (ref 1.7–7.7)
Neutrophils Relative %: 72 %
Platelets: 332 10*3/uL (ref 150–400)
RBC: 3.2 MIL/uL — ABNORMAL LOW (ref 3.87–5.11)
RDW: 14.4 % (ref 11.5–15.5)
WBC: 21 10*3/uL — ABNORMAL HIGH (ref 4.0–10.5)
nRBC: 0 % (ref 0.0–0.2)

## 2022-07-27 LAB — CULTURE, BODY FLUID W GRAM STAIN -BOTTLE

## 2022-07-27 LAB — RENAL FUNCTION PANEL
Albumin: 1.8 g/dL — ABNORMAL LOW (ref 3.5–5.0)
Anion gap: 17 — ABNORMAL HIGH (ref 5–15)
BUN: 40 mg/dL — ABNORMAL HIGH (ref 8–23)
CO2: 21 mmol/L — ABNORMAL LOW (ref 22–32)
Calcium: 9.6 mg/dL (ref 8.9–10.3)
Chloride: 90 mmol/L — ABNORMAL LOW (ref 98–111)
Creatinine, Ser: 6.79 mg/dL — ABNORMAL HIGH (ref 0.44–1.00)
GFR, Estimated: 6 mL/min — ABNORMAL LOW (ref >60)
Glucose, Bld: 86 mg/dL (ref 70–99)
Phosphorus: 5.2 mg/dL — ABNORMAL HIGH (ref 2.5–4.6)
Potassium: 3.6 mmol/L (ref 3.5–5.1)
Sodium: 128 mmol/L — ABNORMAL LOW (ref 135–145)

## 2022-07-27 LAB — MAGNESIUM: Magnesium: 2 mg/dL (ref 1.7–2.4)

## 2022-07-27 MED ORDER — DULOXETINE HCL 20 MG PO CPEP
20.0000 mg | ORAL_CAPSULE | Freq: Every day | ORAL | Status: DC
  Administered 2022-07-27 – 2022-07-30 (×4): 20 mg via ORAL
  Filled 2022-07-27 (×4): qty 1

## 2022-07-27 MED ORDER — METOCLOPRAMIDE HCL 5 MG/ML IJ SOLN
5.0000 mg | Freq: Three times a day (TID) | INTRAMUSCULAR | Status: AC
Start: 2022-07-27 — End: 2022-07-28
  Administered 2022-07-27 – 2022-07-28 (×4): 5 mg via INTRAVENOUS
  Filled 2022-07-27 (×4): qty 2

## 2022-07-27 MED ORDER — PREGABALIN 25 MG PO CAPS
25.0000 mg | ORAL_CAPSULE | Freq: Every day | ORAL | Status: DC
  Administered 2022-07-27 – 2022-07-29 (×3): 25 mg via ORAL
  Filled 2022-07-27 (×3): qty 1

## 2022-07-27 MED ORDER — POLYETHYLENE GLYCOL 3350 17 G PO PACK
17.0000 g | PACK | Freq: Two times a day (BID) | ORAL | Status: DC
  Administered 2022-07-27 – 2022-07-30 (×6): 17 g via ORAL
  Filled 2022-07-27 (×8): qty 1

## 2022-07-27 NOTE — Plan of Care (Signed)
  Problem: Pain Managment: Goal: General experience of comfort will improve Outcome: Progressing   

## 2022-07-27 NOTE — Progress Notes (Signed)
Progress Note Patient: Erika Fry JQB:[id number] DOB: [birth year] DOA: 08/01/2022  DOS: the patient was seen and examined on 07/27/2022  Brief hospital course: 76 year old female with pertinent PMH of ESRD on peritoneal dialysis, DMT2, anemia, thrombocytopenia, HFpEF, pacemaker in place presents to [facility] ED on 8/12 with generalized weakness. Found to have shock possibly septic shock.  There is also concern for peritonitis.  Treated with IV antibiotics.  Now appears to have developed ileus along with peritonitis from peritoneal catheter as of 8/23.  Currently on IV vancomycin. Assessment and Plan: Septic shock presented with fatigue, generalized weakness, met SIRS criteria on admission with leukocytosis and hypotension. Treated with IV cefepime and vancomycin on admission and later on with cefepime alone as well as fluconazole. Cultures so far negative. Received IV Levophed at the time of admission and currently on midodrine. TTE was concerning for possible endocarditis.  TEE was negative. Blood cultures so far negative. Continue midodrine.  Staph hominis peritonitis. Due to abdominal pain and worsening leukocytosis on 8/23 peritoneal fluid was sent again for culture. Peritoneal fluid culture growing gram-positive cocci. Given that the patient has abdominal pain, ileus as well as leukocytosis we will initiate the patient on IV vancomycin. Monitor. May require removal of her PD catheter if continues to have lack of response.  Ileus. Etiology not clear. We will continue to monitor. Continue scheduled Reglan.  Hypomagnesemia/hypokalemia/hyponatremia Currently undergoing peritoneal dialysis per nephrology. Electrolyte management per nephrology.   Moderate AAS/mild to moderate AI/moderate MR Outpatient follow-up with cardiology.   Vaginal/abdominal pain Chronic peripheral neuropathy of lower extremities Continue current pain regimen.  Add Cymbalta and Lyrica -Outpatient  follow-up.   Chronic anemia secondary to ESRD Patient likely on presentation with hemoconcentration. Hemoglobin currently stable -Per nephrology.   ESRD on CCPD Currently on nightly PD. -Per nephrology. -Will need HD if needs to go to SNF.   Endocarditis ruled out 2D echo done on presentation with concern for thrombus on pacemaker lead and ascending aorta. Patient seen in consultation by cardiology, underwent TEE which was negative for vegetations.   Bilateral chronic venous stasis -Lower extremity Dopplers negative for DVT.   Hypothyroidism -Synthroid.    Vaginal candidiasis -Status post Diflucan 150 mg p.o. x1. -Continue clotrimazole vaginal cream daily nightly.   Constipation Continue MiraLAX.  Continue simethicone, continue scheduled Reglan.  Disposition. We will consult CIR for consideration for rehab as the patient is motivated and wants to continue PD instead of transitioning to HD.   Subjective: Abdominal pain still present.  No nausea no vomiting no fever no chills.  Had a bowel movement.  Physical Exam: Vitals:   07/26/22 2110 07/27/22 0526 07/27/22 1001 07/27/22 1647  BP: (!) 98/57 (!) 88/50 (!) 91/55 (!) 93/55  Pulse: 69 73 77 68  Resp: 18 18 16 16   Temp: 98.6 F (37 C) 97.8 F (36.6 C) 98.3 F (36.8 C) 98.4 F (36.9 C)  TempSrc: Oral Oral    SpO2: 97% 97% 98% 98%  Weight:      Height:       General: Appear in mild distress; no visible Abnormal Neck Mass Or lumps, Conjunctiva normal Cardiovascular: S1 and S2 Present, aortic systolic  Murmur, Respiratory: good respiratory effort, Bilateral Air entry present and CTA, no Crackles, no wheezes Abdomen: Bowel Sound present, lower abdominal tenderness Extremities: no Pedal edema Neurology: alert and oriented to Self, Place and time.  Data Reviewed: I have Reviewed nursing notes, Vitals, and Lab results since pt's last encounter. Pertinent lab results CBC  and BMP I have ordered test including CBC and BMP       Family Communication: No one at bedside, at patient's request discussed with patient's daughter-in-law Anderson Malta on the phone.  Disposition: Status is: Inpatient Remains inpatient appropriate because: Being treated for peritonitis with IV antibiotics.  Author: Berle Mull, MD 07/27/2022 7:52 PM  Please look on [email or web address] to find out who is on call.

## 2022-07-27 NOTE — Progress Notes (Signed)
Subjective: Noted last night problems with PD catheter required tPA infusion overnight-heparin was started in bags.  This a.m. just had large bowel movement feels somewhat better but some continued abdominal discomfort.  Able to eat some breakfast /wants to save PD catheter if all possible, no report yet from CIR possibility  Objective Vital signs in last 24 hours: Vitals:   07/26/22 1839 07/26/22 2110 07/27/22 0526 07/27/22 1001  BP: (!) 98/58 (!) 98/57 (!) 88/50 (!) 91/55  Pulse:  69 73 77  Resp: '18 18 18 16  '$ Temp: 98.5 F (36.9 C) 98.6 F (37 C) 97.8 F (36.6 C) 98.3 F (36.8 C)  TempSrc: Oral Oral Oral   SpO2: 96% 97% 97% 98%  Weight:      Height:       Weight change:   Physical Exam: General: Elderly female alert NAD Heart: RRR no MRG Lungs: CTA bilaterally nonlabored breathing Abdomen: Obese, NABS, soft, minimally tender left quad, ND Extremities: Trace pedal edema, bilateral lower extremity chronic venous stasis changes dialysis Access: PD cath in place nontender, no discharge noted, positive bruit right arm basilic vein AV fistula     Dialysis Orders: CCPD. Follows at [facility]. 7d/ wk, 75kg, 6 exchanges overnight, 2800cc , 1.5h dwell, no daybag - last Hb 12.9 on 8/2, last mircera 6/26 - last pth 1027, phos 9 - no esa or vdra     Problem/Plan: Hypotension sepsis, generalized weakness(noted admit S IRS criteria on admission with leukocytosis and hypotension)-now off pressors.  BP soft on midodrine 10 mg 3 times daily, asymptomatic, noted EF 60 to 65% on TEE Staphylococcus hominis peritonitis leukocytosis= now afebrile.  WBC 28,000 now down to 21,000 .TEE negative , on vancomycin, if has continued abdominal pain , despite antibiotics may need to have PD catheter removed however patient wants to treat with antibiotics for now.  Blood cultures no growth ESRD -K3.6, patent 6.79 BUN 40 all stable but complications again yesterday with PD cycler catheter tPA instilled overnight and  also noting to use heparin each bag.  If unable to do cycler today discussed with patient using her arm for HD.  As above she wants to say PD catheter at all possible.   CCPD use 1.5 As noted patient will change to HD if needed for outpatient rehab but prefers PD hopefully CIR will accept.  Will DC sodium bicarb on PD follow-up trend HTN/volume -does not appear to be volume overloaded, however noted sodium 128, using 1.5% PD bags (however has not had for PD exchanges past 2 days secondary to PD complication )BP soft on midodrine O2 sat 97% on room air Moderate AAS/mild to moderate AI/moderate MR= cardiology to follow-up Anemia -Hgb 10.1 <12.7 no ESA needs Secondary hyperparathyroidism -calcium corrected over 10, phosphorus 5.2, using Auryxia, PTH was 84= 07/24/22 Hypomagnesia= improved last Mg 2.0 9    bilateral lower extremity pain chronic venous stasis changes= had negative lower extremity Doppler for DVT  Ernest Haber, PA-C [facility] [phone] 07/27/2022,10:10 AM  LOS: 14 days   Labs: Basic Metabolic Panel: Recent Labs  Lab 07/24/22 0542 07/25/22 0447 07/26/22 0613 07/27/22 0450  NA 132* 130* 130* 128*  K 3.2* 3.6 3.7 3.6  CL 92* 90* 92* 90*  CO2 26 25 20* 21*  GLUCOSE 174* 134* 131* 86  BUN 28* 30* 31* 40*  CREATININE 6.34* 6.52* 5.91* 6.79*  CALCIUM 10.4* 10.0 10.0 9.6  PHOS 5.1*  --  4.4 5.2*   Liver Function Tests: Recent  Labs  Lab 07/24/22 0542 07/26/22 0613 07/27/22 0450  ALBUMIN 2.2* 2.1* 1.8*   No results for input(s): "LIPASE", "AMYLASE" in the last 168 hours. No results for input(s): "AMMONIA" in the last 168 hours. CBC: Recent Labs  Lab 07/23/22 0515 07/24/22 0542 07/25/22 0447 07/26/22 0613 07/27/22 0450  WBC 19.7* 24.9* 23.0* 27.1* 21.0*  NEUTROABS  --  20.9*  --   --  15.3*  HGB 11.9* 11.8* 11.9* 12.7 10.1*  HCT 37.0 36.1 36.6 39.5 31.0*  MCV 97.9 97.3 97.1 98.5 96.9  PLT 240 275 275 270 332   Cardiac Enzymes: No results  for input(s): "CKTOTAL", "CKMB", "CKMBINDEX", "TROPONINI" in the last 168 hours. CBG: Recent Labs  Lab 07/26/22 0723 07/26/22 1141 07/26/22 1659 07/26/22 2108 07/27/22 0719  GLUCAP 131* 102* 109* 100* 100*    Studies/Results: DG Abd Portable 1V  Result Date: 07/27/2022 CLINICAL DATA:  Ileus EXAM: PORTABLE ABDOMEN - 1 VIEW COMPARISON:  07/24/2022 FINDINGS: Nonobstructive bowel gas pattern with air-filled loops of large and small bowel within the abdomen, decreasing in caliber from the previous CT. No gross free intraperitoneal air. Peritoneal dialysis catheter within the low pelvis. IMPRESSION: Bowel gas pattern favoring ileus, which appears improving from prior. Electronically Signed   By: Davina Poke D.O.   On: 07/27/2022 09:27   Medications:  sodium chloride Stopped (07/14/22 0109)   sodium chloride Stopped (07/19/22 0400)   dialysis solution 1.5% low-MG/low-CA      clotrimazole  1 Applicatorful Vaginal QHS   cyanocobalamin  1,000 mcg Oral Daily   escitalopram  5 mg Oral Daily   ezetimibe  10 mg Oral Daily   ferric citrate  420 mg Oral TID WC   gentamicin cream  1 Application Topical Daily   heparin injection (subcutaneous)  5,000 Units Subcutaneous Q8H   heparin sodium (porcine) 3,000 Units in dialysis solution 1.5% low-MG/low-CA 6,000 mL dialysis solution   Peritoneal Dialysis Q24H   insulin aspart  0-6 Units Subcutaneous TID WC   levothyroxine  75 mcg Oral QAC breakfast   metoCLOPramide (REGLAN) injection  5 mg Intravenous Q8H   midodrine  10 mg Oral TID with meals   pantoprazole  40 mg Oral Daily   polyethylene glycol  17 g Oral BID   simethicone  80 mg Oral QID   simvastatin  40 mg Oral QHS   vancomycin variable dose per unstable renal function (pharmacist dosing)   Does not apply See admin instructions

## 2022-07-27 NOTE — Progress Notes (Signed)
Pt tx initiated w/o difficulty

## 2022-07-28 DIAGNOSIS — R6521 Severe sepsis with septic shock: Secondary | ICD-10-CM | POA: Diagnosis not present

## 2022-07-28 DIAGNOSIS — A419 Sepsis, unspecified organism: Secondary | ICD-10-CM | POA: Diagnosis not present

## 2022-07-28 LAB — CULTURE, BLOOD (ROUTINE X 2)
Culture: NO GROWTH
Culture: NO GROWTH
Special Requests: ADEQUATE

## 2022-07-28 LAB — GLUCOSE, CAPILLARY
Glucose-Capillary: 136 mg/dL — ABNORMAL HIGH (ref 70–99)
Glucose-Capillary: 109 mg/dL — ABNORMAL HIGH (ref 70–99)
Glucose-Capillary: 121 mg/dL — ABNORMAL HIGH (ref 70–99)
Glucose-Capillary: 160 mg/dL — ABNORMAL HIGH (ref 70–99)

## 2022-07-28 LAB — CBC WITH DIFFERENTIAL/PLATELET
Abs Immature Granulocytes: 0.12 10*3/uL — ABNORMAL HIGH (ref 0.00–0.07)
Basophils Absolute: 0.2 10*3/uL — ABNORMAL HIGH (ref 0.0–0.1)
Basophils Relative: 1 %
Eosinophils Absolute: 0.3 10*3/uL (ref 0.0–0.5)
Eosinophils Relative: 2 %
HCT: 31.4 % — ABNORMAL LOW (ref 36.0–46.0)
Hemoglobin: 10.4 g/dL — ABNORMAL LOW (ref 12.0–15.0)
Immature Granulocytes: 1 %
Lymphocytes Relative: 16 %
Lymphs Abs: 2.6 10*3/uL (ref 0.7–4.0)
MCH: 31.6 pg (ref 26.0–34.0)
MCHC: 33.1 g/dL (ref 30.0–36.0)
MCV: 95.4 fL (ref 80.0–100.0)
Monocytes Absolute: 1.5 10*3/uL — ABNORMAL HIGH (ref 0.1–1.0)
Monocytes Relative: 9 %
Neutro Abs: 11.6 10*3/uL — ABNORMAL HIGH (ref 1.7–7.7)
Neutrophils Relative %: 71 %
Platelets: 250 10*3/uL (ref 150–400)
RBC: 3.29 MIL/uL — ABNORMAL LOW (ref 3.87–5.11)
RDW: 14.4 % (ref 11.5–15.5)
WBC: 16.3 10*3/uL — ABNORMAL HIGH (ref 4.0–10.5)
nRBC: 0 % (ref 0.0–0.2)

## 2022-07-28 LAB — RENAL FUNCTION PANEL
Albumin: 1.9 g/dL — ABNORMAL LOW (ref 3.5–5.0)
Anion gap: 15 (ref 5–15)
BUN: 43 mg/dL — ABNORMAL HIGH (ref 8–23)
CO2: 24 mmol/L (ref 22–32)
Calcium: 9.8 mg/dL (ref 8.9–10.3)
Chloride: 89 mmol/L — ABNORMAL LOW (ref 98–111)
Creatinine, Ser: 6.88 mg/dL — ABNORMAL HIGH (ref 0.44–1.00)
GFR, Estimated: 6 mL/min — ABNORMAL LOW (ref >60)
Glucose, Bld: 119 mg/dL — ABNORMAL HIGH (ref 70–99)
Phosphorus: 4.2 mg/dL (ref 2.5–4.6)
Potassium: 3.3 mmol/L — ABNORMAL LOW (ref 3.5–5.1)
Sodium: 128 mmol/L — ABNORMAL LOW (ref 135–145)

## 2022-07-28 MED ORDER — GENTAMICIN SULFATE 0.1 % EX CREA
1.0000 | TOPICAL_CREAM | Freq: Every day | CUTANEOUS | Status: DC
  Filled 2022-07-28: qty 15

## 2022-07-28 MED ORDER — LACTULOSE 10 GM/15ML PO SOLN
20.0000 g | Freq: Two times a day (BID) | ORAL | Status: DC
  Administered 2022-07-28 – 2022-07-29 (×2): 20 g via ORAL
  Filled 2022-07-28 (×3): qty 30

## 2022-07-28 NOTE — Progress Notes (Signed)
Progress Note Patient: Erika Fry TRV:[id number] DOB: [birth year] DOA: 07/26/2022  DOS: the patient was seen and examined on 07/28/2022  Brief hospital course: 76 year old female with pertinent PMH of ESRD on peritoneal dialysis, DMT2, anemia, thrombocytopenia, HFpEF, pacemaker in place presents to [facility] ED on 8/12 with generalized weakness. Found to have shock possibly septic shock.  There is also concern for peritonitis.  Treated with IV antibiotics.  Now appears to have developed ileus along with peritonitis from peritoneal catheter as of 8/23.  Currently on IV vancomycin. Assessment and Plan: Septic shock presented with fatigue, generalized weakness, met SIRS criteria on admission with leukocytosis and hypotension. Treated with IV cefepime and vancomycin on admission and later on with cefepime alone as well as fluconazole. Cultures so far negative. Received IV Levophed at the time of admission and currently on midodrine. TTE was concerning for possible endocarditis.  TEE was negative. Blood cultures so far negative. Continue midodrine.  Staph hominis peritonitis. Due to abdominal pain and worsening leukocytosis on 8/23 peritoneal fluid was sent again for culture. Peritoneal fluid culture growing gram-positive cocci. Given that the patient has abdominal pain, ileus as well as leukocytosis we will initiate the patient on IV vancomycin. Now appears to be improving.  WBC better. May require removal of her PD catheter if continues to have lack of response.  Ileus. Etiology not clear. We will continue to monitor. Continue scheduled Reglan.  Hypomagnesemia/hypokalemia/hyponatremia Currently undergoing peritoneal dialysis per nephrology. Electrolyte management per nephrology.   Moderate AAS/mild to moderate AI/moderate MR Outpatient follow-up with cardiology.   Vaginal/abdominal pain Chronic peripheral neuropathy of lower extremities Continue current pain regimen.  Add Cymbalta  and Lyrica -Outpatient follow-up.   Chronic anemia secondary to ESRD Patient likely on presentation with hemoconcentration. Hemoglobin currently stable -Per nephrology.   ESRD on CCPD Currently on nightly PD. -Per nephrology. -Will need HD if needs to go to SNF.   Endocarditis ruled out 2D echo done on presentation with concern for thrombus on pacemaker lead and ascending aorta. Patient seen in consultation by cardiology, underwent TEE which was negative for vegetations.   Bilateral chronic venous stasis -Lower extremity Dopplers negative for DVT.   Hypothyroidism -Synthroid.    Vaginal candidiasis -Status post Diflucan 150 mg p.o. x1. -Continue clotrimazole vaginal cream daily nightly.   Constipation Continue MiraLAX.  Continue simethicone, continue scheduled Reglan.  Disposition. CIR consulted to see if the patient can be accepted and can maintain her PD status.   Subjective: Abdominal pain unchanged.  No nausea or vomiting no fever no chills.  Physical Exam: Vitals:   07/27/22 1647 07/27/22 2028 07/28/22 0551 07/28/22 0900  BP: (!) 93/55 (!) 88/55 (!) 83/57 (!) 84/56  Pulse: 68 72 77   Resp: 16 18 18 20   Temp: 98.4 F (36.9 C) 97.6 F (36.4 C) 98.3 F (36.8 C) 98 F (36.7 C)  TempSrc:  Oral Oral Oral  SpO2: 98% 97% 99% 97%  Weight:      Height:       General: Appear in mild distress; no visible Abnormal Neck Mass Or lumps, Conjunctiva normal Cardiovascular: S1 and S2 Present, no Murmur, Respiratory: good respiratory effort, Bilateral Air entry present and CTA, no Crackles, no wheezes Abdomen: Bowel Sound present, Non tender Extremities: no Pedal edema Neurology: alert and oriented to Self, Place and time.  Data Reviewed: I have Reviewed nursing notes, Vitals, and Lab results since pt's last encounter. Pertinent lab results CBC and BMP I have ordered test including CBC and  BMP       Family Communication: No one at bedside,   Disposition: Status is:  Inpatient Remains inpatient appropriate because: Currently needing IV antibiotics and on PD. In need acute rehab.  Author: Berle Mull, MD 07/28/2022 3:39 PM  Please look on [email or web address] to find out who is on call.

## 2022-07-28 NOTE — Progress Notes (Signed)
Subjective: Seen in room did tolerate CCPD after Cathflo catheter and using heparin and bags.  Continued some abdominal discomfort but improving.  Denies any abdominal pain during PD.  Objective Vital signs in last 24 hours: Vitals:   07/27/22 1647 07/27/22 2028 07/28/22 0551 07/28/22 0900  BP: (!) 93/55 (!) 88/55 (!) 83/57 (!) 84/56  Pulse: 68 72 77   Resp: '16 18 18 20  '$ Temp: 98.4 F (36.9 C) 97.6 F (36.4 C) 98.3 F (36.8 C) 98 F (36.7 C)  TempSrc:  Oral Oral Oral  SpO2: 98% 97% 99% 97%  Weight:      Height:       Weight change:   Physical Exam: General: Alert, elderly female, pleasant NAD Heart: RRR no MRG Lungs: CTA bilaterally nonlabored breathing Abdomen: Obese, NABS, soft, minimally tender left quad improved from yesterday ND Extremities: Trace pedal edema, bilateral lower extremity chronic venous stasis changes Dialysis Access: PD cath in place nontender, no discharge noted, + bruit RUA AVF     Dialysis Orders: CCPD. Follows at [facility]. 7d/ wk, 75kg, 6 exchanges overnight, 2800cc , 1.5h dwell, no daybag - last Hb 12.9 on 8/2, last mircera 6/26 - last pth 1027, phos 9 - no esa or vdra     Problem/Plan: Hypotension sepsis, generalized weakness(noted admit S IRS criteria on admission with leukocytosis and hypotension)-now off pressors.  BP soft on midodrine 10 mg 3 times daily, asymptomatic, noted EF 60 to 65% on TEE Staphylococcus hominis peritonitis leukocytosis= now afebrile.  WBC improving 28,000 > 21,000 > 16,300 this a.m. [name] KitchenTEE negative , on vancomycin, if has increasing abdominal pain , despite antibiotics may need to have PD catheter removed however patient wants to treat with antibiotics for now.  Blood cultures no growth ESRD -K3.3 <3.3 ,Cr 6.88 BUN 43  stable , has had complications  with PD  catheter requiring Cathflow instilled overnight 8/25 with noted PD cath working past 24 hours and using  heparin each bag.  If unable to do cycler as discussed this week  discussed with patient .will need to change to HD use her AV fistula .  As above she wants to say PD catheter at all possible.  Tonight CCPD use 1.5 As noted patient will change to HD if needed for outpatient rehab but prefers PD hopefully CIR will accept.   HTN/volume -does not appear to be volume overloaded, however noted sodium 128, using 1.5% PD bags (however had not had for PD exchanges past 2 days secondary to PD complication )BP soft on midodrine completely asymptomatic O2 sat 97% on room air Moderate AAS/mild to moderate AI/moderate MR= cardiology to follow-up Anemia -Hgb 10.4  no ESA needs Secondary hyperparathyroidism -calcium corrected over 10, phosphorus 4.2, using Auryxia, PTH was 84= 07/24/22 Hypomagnesia= improved last Mg 2.0 9    bilateral lower extremity pain chronic venous stasis changes and history of neuropathy = had negative lower extremity Doppler for DVT pain stable controlled currently.  Admit team added Cymbalta and Lyrica to pain regimen  10 debility weakness= admit team did consult CIR.   Ernest Haber, PA-C [facility] [phone] 07/28/2022,12:48 PM  LOS: 15 days   Labs: Basic Metabolic Panel: Recent Labs  Lab 07/26/22 0613 07/27/22 0450 07/28/22 0231  NA 130* 128* 128*  K 3.7 3.6 3.3*  CL 92* 90* 89*  CO2 20* 21* 24  GLUCOSE 131* 86 119*  BUN 31* 40* 43*  CREATININE 5.91* 6.79* 6.88*  CALCIUM 10.0 9.6 9.8  PHOS 4.4 5.2* 4.2   Liver Function Tests: Recent Labs  Lab 07/26/22 0613 07/27/22 0450 07/28/22 0231  ALBUMIN 2.1* 1.8* 1.9*   No results for input(s): "LIPASE", "AMYLASE" in the last 168 hours. No results for input(s): "AMMONIA" in the last 168 hours. CBC: Recent Labs  Lab 07/24/22 0542 07/25/22 0447 07/26/22 0613 07/27/22 0450 07/28/22 0231  WBC 24.9* 23.0* 27.1* 21.0* 16.3*  NEUTROABS 20.9*  --   --  15.3* 11.6*  HGB 11.8* 11.9* 12.7 10.1* 10.4*  HCT 36.1 36.6 39.5 31.0* 31.4*  MCV 97.3 97.1 98.5 96.9 95.4  PLT  275 275 270 332 250   Cardiac Enzymes: No results for input(s): "CKTOTAL", "CKMB", "CKMBINDEX", "TROPONINI" in the last 168 hours. CBG: Recent Labs  Lab 07/27/22 1129 07/27/22 1646 07/27/22 2027 07/28/22 0741 07/28/22 1146  GLUCAP 132* 155* 154* 136* 109*    Studies/Results: DG Abd Portable 1V  Result Date: 07/27/2022 CLINICAL DATA:  Ileus EXAM: PORTABLE ABDOMEN - 1 VIEW COMPARISON:  07/24/2022 FINDINGS: Nonobstructive bowel gas pattern with air-filled loops of large and small bowel within the abdomen, decreasing in caliber from the previous CT. No gross free intraperitoneal air. Peritoneal dialysis catheter within the low pelvis. IMPRESSION: Bowel gas pattern favoring ileus, which appears improving from prior. Electronically Signed   By: Davina Poke D.O.   On: 07/27/2022 09:27   Medications:  sodium chloride Stopped (07/14/22 0109)   sodium chloride Stopped (07/19/22 0400)   dialysis solution 1.5% low-MG/low-CA      cyanocobalamin  1,000 mcg Oral Daily   DULoxetine  20 mg Oral Daily   ezetimibe  10 mg Oral Daily   ferric citrate  420 mg Oral TID WC   gentamicin cream  1 Application Topical Daily   heparin injection (subcutaneous)  5,000 Units Subcutaneous Q8H   heparin sodium (porcine) 3,000 Units in dialysis solution 1.5% low-MG/low-CA 6,000 mL dialysis solution   Peritoneal Dialysis Q24H   insulin aspart  0-6 Units Subcutaneous TID WC   lactulose  20 g Oral BID   levothyroxine  75 mcg Oral QAC breakfast   metoCLOPramide (REGLAN) injection  5 mg Intravenous Q8H   midodrine  10 mg Oral TID with meals   pantoprazole  40 mg Oral Daily   polyethylene glycol  17 g Oral BID   pregabalin  25 mg Oral QHS   simethicone  80 mg Oral QID   simvastatin  40 mg Oral QHS   vancomycin variable dose per unstable renal function (pharmacist dosing)   Does not apply See admin instructions

## 2022-07-28 NOTE — Progress Notes (Signed)
PD tx initation note:   Pre TX VS:   Pre TX weight: 76.6 kg  PD treatment initiated via aseptic technique. Consent signed and in chart. Patient is alert and oriented. No complaints of pain. No specimen collected. PD exit site clean, dry and intact. Gentamycin and new dressing applied. Bedside RN educated on PD machine and how to contact tech support when PD machine alarms.

## 2022-07-28 NOTE — Progress Notes (Signed)
PD post treatment note:   PD treatment completed. Patient tolerated treatment well. PD effluent is clear. No specimen collected. PD exit site clean, dry and intact. Patient is awake, oriented and in no acute distress. Report given to bedside nurse.   Post treatment VS:   Total UF removed:  -[phone]  Post treatment weight:

## 2022-07-29 DIAGNOSIS — R6521 Severe sepsis with septic shock: Secondary | ICD-10-CM | POA: Diagnosis not present

## 2022-07-29 DIAGNOSIS — A419 Sepsis, unspecified organism: Secondary | ICD-10-CM | POA: Diagnosis not present

## 2022-07-29 LAB — RENAL FUNCTION PANEL
Albumin: 2 g/dL — ABNORMAL LOW (ref 3.5–5.0)
Anion gap: 15 (ref 5–15)
BUN: 39 mg/dL — ABNORMAL HIGH (ref 8–23)
CO2: 25 mmol/L (ref 22–32)
Calcium: 10 mg/dL (ref 8.9–10.3)
Chloride: 91 mmol/L — ABNORMAL LOW (ref 98–111)
Creatinine, Ser: 6.59 mg/dL — ABNORMAL HIGH (ref 0.44–1.00)
GFR, Estimated: 6 mL/min — ABNORMAL LOW (ref >60)
Glucose, Bld: 135 mg/dL — ABNORMAL HIGH (ref 70–99)
Phosphorus: 4.4 mg/dL (ref 2.5–4.6)
Potassium: 3 mmol/L — ABNORMAL LOW (ref 3.5–5.1)
Sodium: 131 mmol/L — ABNORMAL LOW (ref 135–145)

## 2022-07-29 LAB — CBC WITH DIFFERENTIAL/PLATELET
Abs Immature Granulocytes: 0.15 10*3/uL — ABNORMAL HIGH (ref 0.00–0.07)
Basophils Absolute: 0.2 10*3/uL — ABNORMAL HIGH (ref 0.0–0.1)
Basophils Relative: 1 %
Eosinophils Absolute: 0.4 10*3/uL (ref 0.0–0.5)
Eosinophils Relative: 2 %
HCT: 33.3 % — ABNORMAL LOW (ref 36.0–46.0)
Hemoglobin: 10.9 g/dL — ABNORMAL LOW (ref 12.0–15.0)
Immature Granulocytes: 1 %
Lymphocytes Relative: 13 %
Lymphs Abs: 2.4 10*3/uL (ref 0.7–4.0)
MCH: 31.8 pg (ref 26.0–34.0)
MCHC: 32.7 g/dL (ref 30.0–36.0)
MCV: 97.1 fL (ref 80.0–100.0)
Monocytes Absolute: 1.7 10*3/uL — ABNORMAL HIGH (ref 0.1–1.0)
Monocytes Relative: 10 %
Neutro Abs: 13 10*3/uL — ABNORMAL HIGH (ref 1.7–7.7)
Neutrophils Relative %: 73 %
Platelets: 280 10*3/uL (ref 150–400)
RBC: 3.43 MIL/uL — ABNORMAL LOW (ref 3.87–5.11)
RDW: 14.5 % (ref 11.5–15.5)
WBC: 17.7 10*3/uL — ABNORMAL HIGH (ref 4.0–10.5)
nRBC: 0 % (ref 0.0–0.2)

## 2022-07-29 LAB — VANCOMYCIN, RANDOM: Vancomycin Rm: 25 ug/mL

## 2022-07-29 LAB — GLUCOSE, CAPILLARY
Glucose-Capillary: 125 mg/dL — ABNORMAL HIGH (ref 70–99)
Glucose-Capillary: 134 mg/dL — ABNORMAL HIGH (ref 70–99)
Glucose-Capillary: 128 mg/dL — ABNORMAL HIGH (ref 70–99)

## 2022-07-29 MED ORDER — LACTULOSE 10 GM/15ML PO SOLN
20.0000 g | Freq: Every day | ORAL | Status: DC
  Administered 2022-07-30: 20 g via ORAL
  Filled 2022-07-29: qty 30

## 2022-07-29 MED ORDER — DELFLEX-LC/1.5% DEXTROSE 344 MOSM/L IP SOLN
INTRAPERITONEAL | Status: DC
  Filled 2022-07-29 (×3): qty 3000

## 2022-07-29 MED ORDER — GENTAMICIN SULFATE 0.1 % EX CREA: 1.0000 | TOPICAL_CREAM | Freq: Every day | CUTANEOUS | Status: DC

## 2022-07-29 NOTE — Progress Notes (Signed)
Progress Note Patient: Erika Fry YKD:[id number] DOB: [birth year] DOA: 07/29/2022  DOS: the patient was seen and examined on 07/29/2022  Brief hospital course: 76 year old female with pertinent PMH of ESRD on peritoneal dialysis, DMT2, anemia, thrombocytopenia, HFpEF, pacemaker in place presents to [facility] ED on 8/12 with generalized weakness. Found to have shock possibly septic shock.  There is also concern for peritonitis.  Treated with IV antibiotics.  Now appears to have developed ileus along with peritonitis from peritoneal catheter as of 8/23.  Currently on IV vancomycin. Assessment and Plan: Septic shock presented with fatigue, generalized weakness, met SIRS criteria on admission with leukocytosis and hypotension. Treated with IV cefepime and vancomycin on admission and later on with cefepime alone as well as fluconazole. Cultures so far negative. Received IV Levophed at the time of admission and currently on midodrine. TTE was concerning for possible endocarditis.  TEE was negative. Blood cultures so far negative. Continue midodrine.  Staph hominis peritonitis. Due to abdominal pain and worsening leukocytosis on 8/23 peritoneal fluid was sent again for culture. Peritoneal fluid culture growing gram-positive cocci. Given that the patient has abdominal pain, ileus as well as leukocytosis we will initiate the patient on IV vancomycin. Now appears to be improving.  WBC better. May require removal of her PD catheter if continues to have lack of response.  Ileus. Etiology not clear. We will continue to monitor. Continue scheduled Reglan.  Hypomagnesemia/hypokalemia/hyponatremia Currently undergoing peritoneal dialysis per nephrology. Electrolyte management per nephrology.   Moderate AAS/mild to moderate AI/moderate MR Outpatient follow-up with cardiology.   Vaginal/abdominal pain Chronic peripheral neuropathy of lower extremities Continue current pain regimen.  Add Cymbalta  and Lyrica -Outpatient follow-up.   Chronic anemia secondary to ESRD Patient likely on presentation with hemoconcentration. Hemoglobin currently stable -Per nephrology.   ESRD on CCPD Currently on nightly PD. -Per nephrology. -Will need HD if needs to go to SNF.   Endocarditis ruled out 2D echo done on presentation with concern for thrombus on pacemaker lead and ascending aorta. Patient seen in consultation by cardiology, underwent TEE which was negative for vegetations.   Bilateral chronic venous stasis -Lower extremity Dopplers negative for DVT.   Hypothyroidism -Synthroid.    Vaginal candidiasis -Status post Diflucan 150 mg p.o. x1. -Continue clotrimazole vaginal cream daily nightly.   Constipation Continue MiraLAX.  Continue simethicone, continue scheduled Reglan.  Disposition. CIR currently cannot open the case with the patient insurance.  Nephro recommended to switch to IHD as indications exist to switch to IHD.  Monitor.   Subjective: Abdominal pain still present although improving. No nausea no vomiting. Appears somewhat slow to response today.  Physical Exam: Vitals:   07/28/22 2216 07/29/22 0554 07/29/22 0921 07/29/22 1648  BP: (!) 89/57 97/60 97/60  99/62  Pulse: 79 72 70 70  Resp: 18 18 17 18   Temp: 97.7 F (36.5 C) 97.7 F (36.5 C) 98.4 F (36.9 C) 98.6 F (37 C)  TempSrc:   Oral   SpO2: 96% 100% 100% 97%  Weight:      Height:       General: Appear in mild distress; no visible Abnormal Neck Mass Or lumps, Conjunctiva normal Cardiovascular: S1 and S2 Present, aortic systolic Murmur, Respiratory: good respiratory effort, Bilateral Air entry present and CTA, no Crackles, no wheezes Abdomen: Bowel Sound present, Non tender Extremities: no Pedal edema Neurology: alert and oriented to Self, Place and time.   Data Reviewed: I have Reviewed nursing notes, Vitals, and Lab results since pt's last encounter.  Pertinent lab results CBC and BMP I have  ordered test including CBC and BMP I have discussed pt's care plan and test results with nephrology.       Family Communication: Daughter-in-law Anderson Malta at bedside  Disposition: Status is: Inpatient Remains inpatient appropriate because: On IV antibiotics.  Will require further discussion with regards to disposition. Author: Berle Mull, MD 07/29/2022 7:47 PM  Please look on [email or web address] to find out who is on call.

## 2022-07-29 NOTE — Progress Notes (Signed)
[facility] Progress Note   Subjective:   Seen in room. Still having some abdominal pain and nausea. C/o significant weakness since admission, says she gets tired and winded easily. Still wants to do CIR and continue PD if possible.   Objective Vitals:   07/28/22 0900 07/28/22 1649 07/28/22 2216 07/29/22 0554  BP: (!) 84/56 (!) 84/49 (!) 89/57 97/60  Pulse:  69 79 72  Resp: '20 18 18 18  '$ Temp: 98 F (36.7 C) 98 F (36.7 C) 97.7 F (36.5 C) 97.7 F (36.5 C)  TempSrc: Oral     SpO2: 97% 97% 96% 100%  Weight:      Height:       Physical Exam General: Alert female in NAD Heart: RRR, no murmurs, rubs or gallops Lungs: CTA bilaterally, respirations unlabored on RA Abdomen: Soft, non-distended, non-tender, +BS Extremities: Trace nonpitting edema bilateral lower extremities Dialysis Access: PD cath in lower abdomen, + Bruit RUE AVF  Additional Objective Labs: Basic Metabolic Panel: Recent Labs  Lab 07/26/22 0613 07/27/22 0450 07/28/22 0231  NA 130* 128* 128*  K 3.7 3.6 3.3*  CL 92* 90* 89*  CO2 20* 21* 24  GLUCOSE 131* 86 119*  BUN 31* 40* 43*  CREATININE 5.91* 6.79* 6.88*  CALCIUM 10.0 9.6 9.8  PHOS 4.4 5.2* 4.2   Liver Function Tests: Recent Labs  Lab 07/26/22 0613 07/27/22 0450 07/28/22 0231  ALBUMIN 2.1* 1.8* 1.9*   No results for input(s): "LIPASE", "AMYLASE" in the last 168 hours. CBC: Recent Labs  Lab 07/24/22 0542 07/25/22 0447 07/26/22 0613 07/27/22 0450 07/28/22 0231  WBC 24.9* 23.0* 27.1* 21.0* 16.3*  NEUTROABS 20.9*  --   --  15.3* 11.6*  HGB 11.8* 11.9* 12.7 10.1* 10.4*  HCT 36.1 36.6 39.5 31.0* 31.4*  MCV 97.3 97.1 98.5 96.9 95.4  PLT 275 275 270 332 250   Blood Culture    Component Value Date/Time   SDES FLUID PERITONEAL 07/24/2022 1549   SDES FLUID PERITONEAL 07/24/2022 1549   SPECREQUEST BOTTLES DRAWN AEROBIC AND ANAEROBIC DIALYSATE 07/24/2022 1549   SPECREQUEST DIALYSIS 07/24/2022 1549   CULT STAPHYLOCOCCUS  HOMINIS (A) 07/24/2022 1549   REPTSTATUS 07/27/2022 FINAL 07/24/2022 1549   REPTSTATUS 07/24/2022 FINAL 07/24/2022 1549    Cardiac Enzymes: No results for input(s): "CKTOTAL", "CKMB", "CKMBINDEX", "TROPONINI" in the last 168 hours. CBG: Recent Labs  Lab 07/28/22 0741 07/28/22 1146 07/28/22 1648 07/28/22 2214 07/29/22 0724  GLUCAP 136* 109* 121* 160* 125*   Iron Studies: No results for input(s): "IRON", "TIBC", "TRANSFERRIN", "FERRITIN" in the last 72 hours. '@lablastinr3'$ @ Studies/Results: No results found. Medications:  sodium chloride Stopped (07/14/22 0109)   sodium chloride Stopped (07/19/22 0400)   dialysis solution 1.5% low-MG/low-CA      cyanocobalamin  1,000 mcg Oral Daily   DULoxetine  20 mg Oral Daily   ezetimibe  10 mg Oral Daily   ferric citrate  420 mg Oral TID WC   gentamicin cream  1 Application Topical Daily   heparin injection (subcutaneous)  5,000 Units Subcutaneous Q8H   heparin sodium (porcine) 3,000 Units in dialysis solution 1.5% low-MG/low-CA 6,000 mL dialysis solution   Peritoneal Dialysis Q24H   insulin aspart  0-6 Units Subcutaneous TID WC   lactulose  20 g Oral BID   levothyroxine  75 mcg Oral QAC breakfast   midodrine  10 mg Oral TID with meals   pantoprazole  40 mg Oral Daily   polyethylene glycol  17 g Oral BID  pregabalin  25 mg Oral QHS   simethicone  80 mg Oral QID   simvastatin  40 mg Oral QHS   vancomycin variable dose per unstable renal function (pharmacist dosing)   Does not apply See admin instructions    Outpatient Dialysis Orders: CCPD. Follows at [facility]. 7d/ wk, 75kg, 6 exchanges overnight, 2800cc , 1.5h dwell, no daybag - last Hb 12.9 on 8/2, last mircera 6/26 - last pth 1027, phos 9 - no esa or vdra  Assessment/Plan: Hypotension sepsis, generalized weakness(noted admit S IRS criteria on admission with leukocytosis and hypotension): now off pressors.  BP soft on midodrine 10 mg 3 times daily, asymptomatic, noted EF 60 to 65% on  TEE 2. Staphylococcus hominis peritonitis leukocytosis: now afebrile.  WBC improving. TEE negative. Now on vancomycin, if has increasing abdominal pain , despite antibiotics may need to have PD catheter removed however patient wants to treat with antibiotics for now.  3. ESRD: had complications  with PD  catheter requiring Cathflow instilled overnight 8/25 with noted PD cath working past 24 hours and using  heparin each bag.  If unable to do cycler as discussed this week discussed with patient will need to change to HD use her AV fistula . Discussed that she may also feel better on HD. As above she wants to say PD catheter at all possible.  Tonight CCPD use 1.5% dextrose. Patient will change to HD if needed for outpatient rehab but prefers PD hopefully CIR will accept.   4. HTN/volume: does not appear to be volume overloaded, however noted sodium 128, using 1.5% PD bags (however had not had good PD exchanges end of lat week secondary to PD complication). BP soft, continue midodrine 5. Moderate AAS/mild to moderate AI/moderate MR: cardiology to follow-up 6. Anemia: Hgb 10.4  no ESA needs 7.Secondary hyperparathyroidism -calcium corrected over 10, phosphorus 4.2, using Auryxia, PTH was 84= 07/24/22 8. Hypomagnesia/hypokalemia: K+ slightly low, monitor for now. Magnesium improved to 2.2.  9    bilateral lower extremity pain chronic venous stasis changes and history of neuropathy = had negative lower extremity Doppler for DVT pain stable controlled currently.  Admit team added Cymbalta and Lyrica to pain regimen  10. debility/ weakness: admit team did consult CIR.   Anice Paganini, PA-C 07/29/2022, 8:54 AM  [facility] Pager: [phone]

## 2022-07-29 NOTE — Progress Notes (Signed)
Physical Therapy Treatment Patient Details Name: Erika Fry MRN: [id number] DOB: Sep 19, 1946 Today's Date: 07/29/2022   History of Present Illness 76 year old female presents to [facility] ED on 8/12 with generalized weakness.  Pt with septic shock, hypotension and questionable vegetation on pacer lead in aorta.  Pt for TEE 8/15.  PMH of ESRD on peritoneal dialysis, DMT2, anemia, thrombocytopenia, HFpEF, pacemaker    PT Comments    Patient lethargic and with increased assist needed for bed mobility and coming to sit at EOB. In sitting, pt with forward flexed posture and unable to hold trunk and head upright. Reported spinning dizziness when in sidelying and HOB elevated. Appeared to last ~30 seconds with pt unable to provide reliable/accurate feedback. May benefit from vestibular evaluation next visit, if pt able to provide answers to questions during assessment.     Recommendations for follow up therapy are one component of a multi-disciplinary discharge planning process, led by the attending physician.  Recommendations may be updated based on patient status, additional functional criteria and insurance authorization.  Follow Up Recommendations  Skilled nursing-short term rehab (<3 hours/day) Can patient physically be transported by private vehicle: No   Assistance Recommended at Discharge Frequent or constant Supervision/Assistance  Patient can return home with the following Two people to help with walking and/or transfers;Two people to help with bathing/dressing/bathroom;Assistance with cooking/housework;Direct supervision/assist for financial management;Direct supervision/assist for medications management;Assist for transportation;Help with stairs or ramp for entrance   Equipment Recommendations  None recommended by PT    Recommendations for Other Services       Precautions / Restrictions Precautions Precautions: Fall Precaution Comments: peritoneal dialysis Restrictions Weight  Bearing Restrictions: No     Mobility  Bed Mobility Overal bed mobility: Needs Assistance Bed Mobility: Rolling Rolling: Mod assist Sidelying to sit: Mod assist, +2 for physical assistance, HOB elevated     Sit to sidelying: Max assist, +2 for physical assistance General bed mobility comments: more help needed than last visit; pt with frequent eye-closing stating the room is spinning    Transfers Overall transfer level: Needs assistance Equipment used: Rolling walker (2 wheels), 2 person hand held assist Transfers: Sit to/from Stand Sit to Stand: +2 physical assistance (attempted x 1 with very little effort from pt; having difficulty holding head and torso upright in sitting)                Ambulation/Gait               General Gait Details: Unable   Stairs             Wheelchair Mobility    Modified Rankin (Stroke Patients Only)       Balance Overall balance assessment: Needs assistance Sitting-balance support: Feet supported, No upper extremity supported Sitting balance-Leahy Scale: Poor Sitting balance - Comments: Requires Min-Mod A for sitting balance due to right lateral lean       Standing balance comment: unable to stand today                            Cognition Arousal/Alertness: Lethargic Behavior During Therapy: Flat affect Overall Cognitive Status: No family/caregiver present to determine baseline cognitive functioning                                 General Comments: pt feels she could stand up if she had something to hold onto (  she could not as she could not even hold her torso upright in sitting at EOB)        Exercises General Exercises - Lower Extremity Heel Slides: AAROM, 5 reps, Supine Other Exercises Other Exercises: attempted seated LAQ and pt unable to overcome force of gravity    General Comments        Pertinent Vitals/Pain Pain Assessment Pain Assessment: Faces Faces Pain Scale:  Hurts little more Pain Location: LEs with movement Pain Descriptors / Indicators: Grimacing, Guarding Pain Intervention(s): Limited activity within patient's tolerance, Monitored during session    Home Living                          Prior Function            PT Goals (current goals can now be found in the care plan section) Acute Rehab PT Goals Patient Stated Goal: to go home Time For Goal Achievement: 08-20-2022 Potential to Achieve Goals: Fair Progress towards PT goals: Not progressing toward goals - comment;PT to reassess next treatment    Frequency    Min 3X/week      PT Plan Current plan remains appropriate    Co-evaluation              AM-PAC PT "6 Clicks" Mobility   Outcome Measure  Help needed turning from your back to your side while in a flat bed without using bedrails?: A Lot Help needed moving from lying on your back to sitting on the side of a flat bed without using bedrails?: A Lot Help needed moving to and from a bed to a chair (including a wheelchair)?: Total Help needed standing up from a chair using your arms (e.g., wheelchair or bedside chair)?: Total Help needed to walk in hospital room?: Total Help needed climbing 3-5 steps with a railing? : Total 6 Click Score: 8    End of Session Equipment Utilized During Treatment: Gait belt Activity Tolerance: Patient limited by fatigue Patient left: in bed;with call bell/phone within reach;with bed alarm set Nurse Communication: Mobility status PT Visit Diagnosis: Muscle weakness (generalized) (M62.81);Difficulty in walking, not elsewhere classified (R26.2)     Time: 5465-6812 PT Time Calculation (min) (ACUTE ONLY): 21 min  Charges:  $Therapeutic Activity: 8-22 mins                      Arby Barrette, PT Acute Rehabilitation Services  Office [phone]    Rexanne Mano 07/29/2022, 2:35 PM

## 2022-07-29 NOTE — Progress Notes (Signed)
  Inpatient Rehabilitation Admissions Coordinator   Met with patient and daughter in [place], [place], at bedside for rehab assessment. We discussed goals and expectations of a possible CIR admit. She has been to CIR in the past and prefers CIR for rehab. I can not currently pursue CIR admit with [place] with current therapy recommendations for SNF. I explained that I will see how she progresses to see if recommendations for CIR would be appropriate. She is asking to get up to chair daily with therapy or nursing. Please call me with any questions.   Danne Baxter, RN, MSN Rehab Admissions Coordinator [phone]

## 2022-07-29 NOTE — Progress Notes (Signed)
Pharmacy Antibiotic Note  Erika Fry is a 76 y.o. female admitted on 07/25/2022 with sepsis. Of note, patient is ESRD on PD. Fluid cell count on 8/23 low, but patient now growing staph hominis in peritoneal fluid culture.TEE without vegetation this admission. Pharmacy has been consulted for Vancomycin dosing.  Last vancomycin dose 8/24. Last PD 8/27 PM. Vancomycin random level 8/28 AM 25 mcg/mL, therapeutic. No re-dose needed currently. Recheck random level again in 3-5 days.  Plan: Continue intermittent vancomycin dosing based on levels F/u cultures, clinical progress, levels as indicated Trend WBC, fever, renal function De-escalate when able   Height: '5\' 5"'$  (165.1 cm) Weight: 77.6 kg (171 lb 1.2 oz) IBW/kg (Calculated) : 57  Temp (24hrs), Avg:98 F (36.7 C), Min:97.7 F (36.5 C), Max:98.4 F (36.9 C)  Recent Labs  Lab 07/25/22 0447 07/26/22 0613 07/27/22 0450 07/28/22 0231 07/29/22 0726 07/29/22 0915  WBC 23.0* 27.1* 21.0* 16.3* 17.7*  --   CREATININE 6.52* 5.91* 6.79* 6.88* 6.59*  --   VANCORANDOM  --   --   --   --   --  25    Estimated Creatinine Clearance: 7.2 mL/min (A) (by C-G formula based on SCr of 6.59 mg/dL (H)).    Allergies  Allergen Reactions   Lioresal [Baclofen] Other (See Comments)    Somnolence "Put me into a coma and almost killed me"    Tape Other (See Comments)    Very sensitive skin, will tear easliy   Other Other (See Comments)    Patient has a high tolerance to antibiotics- has taken a lot of them during the course of her life   Codeine Other (See Comments)    Increased pain and couldn't sleep   Robitussin Cold Cough+ Chest [Dextromethorphan-Guaifenesin] Other (See Comments)    Unknown reaction    Antimicrobials this admission: Cefepime 8/12 >> 8/18 Vancomycin x1 8/12; 8/24 >>  Vancomycin levels this admission: 8/28 Vanc random: 25  Microbiology results: 8/12 MRSA PCR: negative 8/12 Bcx: ngtd 8/12 Peritoneal Cx: ngtd 8/22  BCx: ngtd 8/23 Peritoneal Cx: staph hominis    Thank you for allowing pharmacy to be a part of this patient's care.  Ardyth Harps, PharmD Clinical Pharmacist

## 2022-07-29 NOTE — TOC Progression Note (Signed)
Transition of Care [facility]) - Initial/Assessment Note    Patient Details  Name: Erika Fry MRN: [id number] Date of Birth: Apr 07, 1946  Transition of Care [facility]) CM/SW Contact:    Milinda Antis, LCSWA Phone Number: 07/29/2022, 10:22 AM  Clinical Narrative:                 Per MD, CIR consulted to evaluate patient.  Pending CIR's determination.    TOC will continue to follow.    Expected Discharge Plan: [place] Barriers to Discharge: Continued Medical Work up   Patient Goals and CMS Choice Patient states their goals for this hospitalization and ongoing recovery are:: To return home CMS Medicare.gov Compare Post Acute Care list provided to:: Patient Choice offered to / list presented to : Patient  Expected Discharge Plan and Services Expected Discharge Plan: [place]   Discharge Planning Services: CM Consult Post Acute Care Choice: [place] arrangements for the past 2 months: Single Family Home                 DME Arranged: N/A DME Agency: NA       HH Arranged: PT HH Agency: [place] Date [place]: 07/17/22 Time [place]: 1 Representative spoke with at [place]: Claiborne Billings  Prior Living Arrangements/Services Living arrangements for the past 2 months: [place] with:: Self Patient language and need for interpreter reviewed:: Yes Do you feel safe going back to the place where you live?: Yes      Need for Family Participation in Patient Care: Yes (Comment) Care giver support system in place?: Yes (comment) Current home services: DME Criminal Activity/Legal Involvement Pertinent to Current Situation/Hospitalization: No - Comment as needed  Activities of Daily Living Home Assistive Devices/Equipment: Hospital bed ADL Screening (condition at time of admission) Patient's cognitive ability adequate to safely complete daily activities?: Yes Is the patient deaf or have  difficulty hearing?: No Does the patient have difficulty seeing, even when wearing glasses/contacts?: No Does the patient have difficulty concentrating, remembering, or making decisions?: No Patient able to express need for assistance with ADLs?: Yes Does the patient have difficulty dressing or bathing?: Yes Independently performs ADLs?: Yes (appropriate for developmental age) Does the patient have difficulty walking or climbing stairs?: Yes Weakness of Legs: Both Weakness of Arms/Hands: Both  Permission Sought/Granted Permission sought to share information with : Case Manager, Family Supports, Chartered certified accountant granted to share information with : Yes, Verbal Permission Granted              Emotional Assessment Appearance:: Appears stated age Attitude/Demeanor/Rapport: Engaged, Gracious Affect (typically observed): Accepting, Appropriate, Calm, Hopeful, Pleasant Orientation: : Oriented to Self, Oriented to Place, Oriented to  Time, Oriented to Situation Alcohol / Substance Use: Not Applicable Psych Involvement: No (comment)  Admission diagnosis:  Weakness [R53.1] Septic shock ([place]) [A41.9, R65.21] Patient Active Problem List   Diagnosis Date Noted   Vaginal pain    High anion gap metabolic acidosis    Pain of upper abdomen    Septic shock ([place]) 08/06/2021   Hypotension 08/06/2021   Changes in vision 06/21/2021   Temporal arteritis syndrome ([place]) 06/21/2021   Acidosis 10/06/2020   Disorder of lipoprotein metabolism, unspecified 10/06/2020   Liver disease, unspecified 10/06/2020   Moderate protein-calorie malnutrition ([place]) 10/06/2020   Other abnormal findings in urine 10/06/2020   Other dietary vitamin B12 deficiency anemia 10/06/2020   Other disorders  of bilirubin metabolism 10/06/2020   Other disorders of electrolyte and fluid balance, not elsewhere classified 10/06/2020   Other disorders resulting from impaired renal tubular function 10/06/2020    Other long term (current) drug therapy 10/06/2020   Allergy, unspecified, initial encounter 08/21/2020   Anaphylactic shock, unspecified, initial encounter 08/21/2020   Nausea 05/18/2020   Acute respiratory disease due to COVID-19 virus 12/25/2019   Encounter for care of pacemaker 09/07/2019   Mild protein-calorie malnutrition ([place]) 07/01/2019   Coagulation defect, unspecified ([place]) 06/29/2019   Secondary hyperparathyroidism of renal origin ([place]) 06/29/2019   ESRD on dialysis ([place])    Pain in both lower extremities    Hypomagnesemia 06/11/2019   DNR (do not resuscitate) discussion    Cystitis    Acute on chronic diastolic CHF (congestive heart failure) ([place]) 06/10/2019   Chronic diastolic CHF (congestive heart failure) ([place]) 06/09/2019   Cellulitis of lower extremity 06/09/2019   Bladder pain    Hyponatremia    Acute on chronic anemia    Diabetes mellitus type 2 in obese (HCC)    Chronic kidney disease    Drug induced constipation    Neurogenic bladder    Weakness generalized 04/21/2019   Acquired hydronephrosis with ureteropelvic junction (UPJ) obstruction    Pain in joint involving pelvic region and thigh    Dysuria    Palliative care by specialist    AKI (acute kidney injury) ([place]) 04/10/2019   Hydronephrosis 04/09/2019   Interstitial cystitis 04/09/2019   Bilateral leg edema 02/25/2019   Chronic heart failure with preserved ejection fraction ([place]) 02/24/2019   Pacemaker Medtronic Azure XT DR MRI Dual Chamber Pacemaker 03/25/2018 02/24/2019   ARF (acute renal failure) ([place]) 02/10/2019   Peripheral edema    Fever 01/17/2019   Acute lower UTI 01/17/2019   Pressure injury of skin 01/17/2019   Gross hematuria 01/14/2019   Fluid retention    Generalized edema    Hypokalemia    Acute right-sided low back pain with right-sided sciatica    Acute blood loss anemia    Anemia of chronic disease    Thrombocytopenia (HCC)    Diabetes mellitus (HCC)    Morbid obesity (HCC)     Hypoalbuminemia due to protein-calorie malnutrition (HCC)    Occult blood in stools    Gastric polyp    Lumbar discitis 05/17/2018   Blood loss anemia 05/12/2018   Hyperlipidemia associated with type 2 diabetes mellitus ([place]) 05/10/2018   Yeast UTI 04/30/2018   Leukocytosis 04/29/2018   Chronic kidney disease (CKD), stage IV (severe) (HCC)    Diet-controlled diabetes mellitus (HCC)    GERD (gastroesophageal reflux disease)    Gout    High cholesterol    Hypothyroidism    Iron deficiency anemia    AV block, Mobitz 2 03/25/2018   Mobitz type 2 second degree AV block 03/20/2018   Exertional dyspnea 03/19/2018   Acute pain of left knee 07/16/2016   Right wrist pain 07/16/2016   PCP:  [name], MD Pharmacy:  No Pharmacies Listed    Social Determinants of Health (SDOH) Interventions    Readmission Risk Interventions    08/13/2021    2:56 PM 12/29/2019    9:29 AM  Readmission Risk Prevention Plan  Transportation Screening Complete Complete  Medication Review Press photographer) Complete Complete  PCP or Specialist appointment within 3-5 days of discharge Complete   HRI or Home Care Consult Complete Complete  SW Recovery Care/Counseling Consult Complete   Palliative Care Screening  Not Applicable Not [place] Patient Refused Not Applicable

## 2022-07-30 DIAGNOSIS — A419 Sepsis, unspecified organism: Secondary | ICD-10-CM | POA: Diagnosis not present

## 2022-07-30 DIAGNOSIS — R6521 Severe sepsis with septic shock: Secondary | ICD-10-CM | POA: Diagnosis not present

## 2022-07-30 LAB — RENAL FUNCTION PANEL
Albumin: 2 g/dL — ABNORMAL LOW (ref 3.5–5.0)
Anion gap: 13 (ref 5–15)
BUN: 33 mg/dL — ABNORMAL HIGH (ref 8–23)
CO2: 26 mmol/L (ref 22–32)
Calcium: 10 mg/dL (ref 8.9–10.3)
Chloride: 92 mmol/L — ABNORMAL LOW (ref 98–111)
Creatinine, Ser: 6.31 mg/dL — ABNORMAL HIGH (ref 0.44–1.00)
GFR, Estimated: 6 mL/min — ABNORMAL LOW (ref >60)
Glucose, Bld: 129 mg/dL — ABNORMAL HIGH (ref 70–99)
Phosphorus: 3.5 mg/dL (ref 2.5–4.6)
Potassium: 2.8 mmol/L — ABNORMAL LOW (ref 3.5–5.1)
Sodium: 131 mmol/L — ABNORMAL LOW (ref 135–145)

## 2022-07-30 LAB — GLUCOSE, CAPILLARY
Glucose-Capillary: 142 mg/dL — ABNORMAL HIGH (ref 70–99)
Glucose-Capillary: 130 mg/dL — ABNORMAL HIGH (ref 70–99)
Glucose-Capillary: 138 mg/dL — ABNORMAL HIGH (ref 70–99)

## 2022-07-30 LAB — CBC WITH DIFFERENTIAL/PLATELET
Abs Immature Granulocytes: 0.12 10*3/uL — ABNORMAL HIGH (ref 0.00–0.07)
Basophils Absolute: 0.2 10*3/uL — ABNORMAL HIGH (ref 0.0–0.1)
Basophils Relative: 1 %
Eosinophils Absolute: 0.4 10*3/uL (ref 0.0–0.5)
Eosinophils Relative: 3 %
HCT: 34.1 % — ABNORMAL LOW (ref 36.0–46.0)
Hemoglobin: 10.9 g/dL — ABNORMAL LOW (ref 12.0–15.0)
Immature Granulocytes: 1 %
Lymphocytes Relative: 15 %
Lymphs Abs: 2.2 10*3/uL (ref 0.7–4.0)
MCH: 31.4 pg (ref 26.0–34.0)
MCHC: 32 g/dL (ref 30.0–36.0)
MCV: 98.3 fL (ref 80.0–100.0)
Monocytes Absolute: 1.4 10*3/uL — ABNORMAL HIGH (ref 0.1–1.0)
Monocytes Relative: 9 %
Neutro Abs: 10.1 10*3/uL — ABNORMAL HIGH (ref 1.7–7.7)
Neutrophils Relative %: 71 %
Platelets: 257 10*3/uL (ref 150–400)
RBC: 3.47 MIL/uL — ABNORMAL LOW (ref 3.87–5.11)
RDW: 14.7 % (ref 11.5–15.5)
WBC: 14.4 10*3/uL — ABNORMAL HIGH (ref 4.0–10.5)
nRBC: 0 % (ref 0.0–0.2)

## 2022-07-30 MED ORDER — POTASSIUM CHLORIDE CRYS ER 20 MEQ PO TBCR
40.0000 meq | EXTENDED_RELEASE_TABLET | Freq: Once | ORAL | Status: AC
Start: 2022-07-30 — End: 2022-07-30
  Administered 2022-07-30: 40 meq via ORAL
  Filled 2022-07-30: qty 2

## 2022-07-31 DIAGNOSIS — G629 Polyneuropathy, unspecified: Secondary | ICD-10-CM

## 2022-07-31 DIAGNOSIS — T8571XA Infection and inflammatory reaction due to peritoneal dialysis catheter, initial encounter: Secondary | ICD-10-CM | POA: Diagnosis present

## 2022-07-31 DIAGNOSIS — K567 Ileus, unspecified: Secondary | ICD-10-CM | POA: Diagnosis present

## 2022-07-31 DIAGNOSIS — I350 Nonrheumatic aortic (valve) stenosis: Secondary | ICD-10-CM

## 2022-08-01 MED FILL — Medication: Qty: 1 | Status: AC

## 2022-08-02 NOTE — Progress Notes (Signed)
Progress Note Patient: Erika Fry BZM:[id number] DOB: October 02, 1946 DOA: 07/31/2022  DOS: the patient was seen and examined on 08/02/22  Brief hospital course: 76 year old female with pertinent PMH of ESRD on peritoneal dialysis, DMT2, anemia, thrombocytopenia, HFpEF, pacemaker in place presents to [facility] ED on 8/12 with generalized weakness. Found to have shock possibly septic shock.  There is also concern for peritonitis.  Treated with IV antibiotics.  Now appears to have developed ileus along with peritonitis from peritoneal catheter as of 8/23.  Currently on IV vancomycin. CIR refused for the patient for now.  Patient agreeable to transition to SNF and transition to HD for that. Assessment and Plan: Septic shock presented with fatigue, generalized weakness, met SIRS criteria on admission with leukocytosis and hypotension. Treated with IV cefepime and vancomycin on admission and later on with cefepime alone as well as fluconazole. Cultures so far negative. Received IV Levophed at the time of admission and currently on midodrine. TTE was concerning for possible endocarditis.  TEE was negative. Blood cultures so far negative. Continue midodrine.  Staph hominis peritonitis. Due to abdominal pain and worsening leukocytosis on 8/23 peritoneal fluid was sent again for culture. Peritoneal fluid culture growing gram-positive cocci. Given that the patient has abdominal pain, ileus as well as leukocytosis we will initiate the patient on IV vancomycin. Now appears to be improving.  WBC better. May require removal of her PD catheter if continues to have lack of response.  Ileus. Etiology not clear. We will continue to monitor. Treated with scheduled Reglan.  Now stopped. Having bowel movement.  Hypomagnesemia/hypokalemia/hyponatremia Currently undergoing peritoneal dialysis per nephrology. Electrolyte management per nephrology.  40 of potassium given on 8/29.   Moderate AAS/mild to  moderate AI/moderate MR s/p Medtronic dual chamber pacemaker for symptomatic high grade AV block, Outpatient follow-up with cardiology.   Vaginal/abdominal pain Chronic peripheral neuropathy of lower extremities Continue current pain regimen.  Add Cymbalta and Lyrica -Outpatient follow-up.   Chronic anemia secondary to ESRD Patient likely on presentation with hemoconcentration. Hemoglobin currently stable -Per nephrology.   ESRD on CCPD Currently on nightly PD. -Per nephrology. -Will need HD if needs to go to SNF.  Patient currently verbalized that she wants to go to SNF, nephrology aware and plan is to switch to HD.   Endocarditis ruled out 2D echo done on presentation with concern for thrombus on pacemaker lead and ascending aorta. Patient seen in consultation by cardiology, underwent TEE which was negative for vegetations.   Bilateral chronic venous stasis -Lower extremity Dopplers negative for DVT.   Hypothyroidism -Synthroid.    Vaginal candidiasis -Status post Diflucan 150 mg p.o. x1. -Was on clotrimazole vaginal cream daily nightly.   Constipation Continue MiraLAX.  Continue simethicone, Treated with scheduled Reglan.  Disposition. CIR currently cannot open the case with the patient insurance.  Nephro recommended to switch to IHD as indications exist to switch to IHD.  Monitor.   Subjective: Patient was seen in the morning.  Reported abdominal pain stable but improving.  No nausea no vomiting.  Oral intake still adequate.  No shortness of breath.  No chest pain.  Wants to go to SNF.  Agrees to switch to HD.  Physical Exam: Vitals:   07/29/22 2116 08-02-2022 0530 08-02-22 0918 August 02, 2022 1651  BP: (!) 81/49 (!) 82/54 (!) 84/48 (!) 86/59  Pulse: 73 75 75 71  Resp: _0 Temp: 98.2 F (36.8 C) (!) 97.5 F (36.4 C) 98.2 F (36.8 C) 98.4 F (36.9  C)  TempSrc: Oral Oral Oral   SpO2: 98% 98% 97% 96%  Weight:      Height:       General: Appear in mild  distress; no visible Abnormal Neck Mass Or lumps, Conjunctiva normal Cardiovascular: S1 and S2 Present, aortic systolic Murmur, Respiratory: good respiratory effort, Bilateral Air entry present and faint basal crackles, no wheezes Abdomen: Bowel Sound present, mild lower abdominal tenderness. Extremities: no Pedal edema Neurology: alert and oriented to Self, Place and time  Data Reviewed: I have Reviewed nursing notes, Vitals, and Lab results since pt's last encounter. Pertinent lab results CBC and BMP I have ordered test including CBC and BMP         Family Communication: No family at bedside  Disposition: Status is: Inpatient Remains inpatient appropriate because: On IV vancomycin.  Need to switch to PD from HD for SNF placement.  Author: Berle Mull, MD 08-04-22   Please look on [email or web address] to find out who is on call.

## 2022-08-02 NOTE — Progress Notes (Signed)
Chaplain responded to Code Blue.  No family present. Chaplain looked up numbers of family members and provided them to nurse who called one son and then another. Family informed that pt's heart had stopped beating and the team was working with patient. Chaplain was called to another floor and will return if family arrives to offer support. Rev. Tamsen Snider Pager [phone]

## 2022-08-02 NOTE — Progress Notes (Signed)
Went to patient room to assist in Code [name] around 1917. Patient seen unresponsive and CN had initiated chest compressions. Code team arrived shortly and worked on the patient. I have called son twice regarding patient deteriorating condition. Son will come up to see patient.  With all the efforts. Patient expired at 72.  Post mortem done and took patient to the morgue.

## 2022-08-02 NOTE — Progress Notes (Incomplete)
Went to patient room to assist in Code [place] around 1917. Patient seen unresponsive and CN had initiated chest compressions. Code team arrived shortly and worked on the patient. I have called son twice regarding patient deteriorating condition. Son will come up to see patient. Patient

## 2022-08-02 NOTE — Significant Event (Signed)
Rapid Response Event Note   Reason for Call :  Called by unit stating Code Blue just called on pt. On arrival, compressions being performed, zoll attached to pt. See code sheet for medications given.   Call Time:1919 Arrival Time: 1921 End Time: 1945  Sherilyn Dacosta, RN

## 2022-08-02 NOTE — Plan of Care (Signed)
  Problem: Education: Goal: Knowledge of disease and its progression will improve Outcome: Progressing   

## 2022-08-02 NOTE — Progress Notes (Signed)
HOSPITAL MEDICINE OVERNIGHT EVENT NOTE    I was paged and notified the patient unfortunately lost her pulse resulting in a CODE BLUE, activation of the code team and initiation of cardiopulmonary resuscitative efforts.  Code team initiated ACLS including high-quality chest compressions and administration of 8 individual doses of 1 mg of epinephrine as well as 2 individual amps of bicarbonate and 1 g of calcium gluconate.  Code team reports the patient was in PEA arrest throughout the entire effort.  Patient unfortunately was never able to regain her pulse with resuscitative efforts ceasing and time of death being called at [name].  Patient was hospitalized for sepsis with septic shock requiring intravenous Levophed and broad-spectrum antibiotics early in the hospitalization with patient gradually improving up to this point.  Earlier in the day patient was seemingly at baseline mentation and was even able to eat her dinner without issue followed by initiation of peritoneal dialysis shortly thereafter.  Review of telemetry reveals the patient did have a run of sustained ventricular tachycardia at 1913 followed by intermittent bouts of what appears to be atrial fibrillation with aberrancy and shorter nonsustained runs of ventricular tachycardia prior to going into asystole prompting to [name] alert with chest compressions being initiated at approximately 1921 by the bedside nurse and the code team.  Despite ventricular tachycardia being identified prior to the patient losing her pulse patient remained in PEA arrest throughout the entire resuscitative effort.  Suspect that impact of sequela of patient's bout with sepsis in addition to patient's underlying ESRD and electrolyte derangements all contributed to patient's significant cardiac ectopy, ventricular tachycardia and eventually asystole.  Family has been notified.  Mortality services will additionally be notified.  I will remain available to speak to  family upon arrival.  Erika Emerald  MD [facility]

## 2022-08-02 NOTE — Progress Notes (Signed)
Code blue called to pt room.  RT arrived with video scope and with assistance of another RT, ETT placed during compressions  with positive color change on ETCO2 detector.  ETT was secured and pt bagged throughout code, approximately 30 mins.

## 2022-08-02 NOTE — Code Documentation (Addendum)
  Patient Name: Erika Fry   MRN: [id number]   Date of Birth/ Sex: May 18, 1946 , female      Admission Date: 07/27/2022  Attending Provider: Lavina Hamman, MD  Primary Diagnosis: Weakness [R53.1] Septic shock ([place]) [A41.9, R65.21]   Indication: Pt was in her usual state of health until this PM, when she was noted to be unresponsive without a pulse. Code blue was subsequently called. At the time of arrival on scene, ACLS protocol was underway.   Technical Description:  - CPR performance duration:  84mnutes  - Was defibrillation or cardioversion used? No   - Was external pacer placed? No  - Was patient intubated pre/post CPR? Yes   Medications Administered: Y = Yes; Blank = No Amiodarone    Atropine    Calcium  yes  Epinephrine  yes  Lidocaine    Magnesium    Norepinephrine    Phenylephrine    Sodium bicarbonate  yes  Vasopressin    Other potassium   Post CPR evaluation:  - Final Status - Was patient successfully resuscitated ? No   Miscellaneous Information:  - Time of death:  7:44 PM  - Primary team notified?  Yes  - Family Notified? Yes  Tele page for asystole. Nurse found patient pulseless.Completed 25 minutes of CP with 8 doses of epi, 2 doses sodium bicarb, 1 dose of potassium. Time of death called at 7:44 PM.   RIona Coach MD   809-28-2023 8:06 PM

## 2022-08-02 NOTE — Progress Notes (Signed)
Refused labs. Resched for 0800.

## 2022-08-02 NOTE — TOC Progression Note (Signed)
Transition of Care [facility]) - Initial/Assessment Note    Patient Details  Name: Erika Fry MRN: [id number] Date of Birth: May 20, 1946  [facility] of [facility]) CM/SW Contact:    Milinda Antis, [place] Phone Number: 08/03/2022, 4:02 PM  Clinical Narrative:                 CSW presented bed offers to the patient and family.  [place] was chosen.  Patient will need to convert to HD prior to admission to SNF.  TOC will continue to follow.    Expected Discharge Plan: [place] Barriers to Discharge: Continued Medical Work up   Patient Goals and CMS Choice Patient states their goals for this hospitalization and ongoing recovery are:: To return home CMS Medicare.gov Compare Post Acute Care list provided to:: Patient Choice offered to / list presented to : Patient  Expected Discharge Plan and Services Expected Discharge Plan: [place]   Discharge Planning Services: CM Consult Post Acute Care Choice: [place] arrangements for the past 2 months: Single Family Home                 DME Arranged: N/A DME Agency: NA       HH Arranged: PT HH Agency: [place] Date [place]: 07/17/22 Time [place]: 68 Representative spoke with at [place]: Claiborne Billings  Prior Living Arrangements/Services Living arrangements for the past 2 months: [place] with:: Self Patient language and need for interpreter reviewed:: Yes Do you feel safe going back to the place where you live?: Yes      Need for Family Participation in Patient Care: Yes (Comment) Care giver support system in place?: Yes (comment) Current home services: DME Criminal Activity/Legal Involvement Pertinent to Current Situation/Hospitalization: No - Comment as needed  Activities of Daily Living Home Assistive Devices/Equipment: Hospital bed ADL Screening (condition at time of admission) Patient's cognitive ability adequate to  safely complete daily activities?: Yes Is the patient deaf or have difficulty hearing?: No Does the patient have difficulty seeing, even when wearing glasses/contacts?: No Does the patient have difficulty concentrating, remembering, or making decisions?: No Patient able to express need for assistance with ADLs?: Yes Does the patient have difficulty dressing or bathing?: Yes Independently performs ADLs?: Yes (appropriate for developmental age) Does the patient have difficulty walking or climbing stairs?: Yes Weakness of Legs: Both Weakness of Arms/Hands: Both  Permission Sought/Granted Permission sought to share information with : Case Manager, Family Supports, Chartered certified accountant granted to share information with : Yes, Verbal Permission Granted              Emotional Assessment Appearance:: Appears stated age Attitude/Demeanor/Rapport: Engaged, Gracious Affect (typically observed): Accepting, Appropriate, Calm, Hopeful, Pleasant Orientation: : Oriented to Self, Oriented to Place, Oriented to  Time, Oriented to Situation Alcohol / Substance Use: Not Applicable Psych Involvement: No (comment)  Admission diagnosis:  Weakness [R53.1] Septic shock ([place]) [A41.9, R65.21] Patient Active Problem List   Diagnosis Date Noted   Vaginal pain    High anion gap metabolic acidosis    Pain of upper abdomen    Septic shock ([place]) 08/06/2021   Hypotension 08/06/2021   Changes in vision 06/21/2021   Temporal arteritis syndrome ([place]) 06/21/2021   Acidosis 10/06/2020   Disorder of lipoprotein metabolism, unspecified 10/06/2020   Liver disease, unspecified 10/06/2020   Moderate protein-calorie malnutrition ([place]) 10/06/2020   Other abnormal findings in urine  10/06/2020   Other dietary vitamin B12 deficiency anemia 10/06/2020   Other disorders of bilirubin metabolism 10/06/2020   Other disorders of electrolyte and fluid balance, not elsewhere classified 10/06/2020   Other  disorders resulting from impaired renal tubular function 10/06/2020   Other long term (current) drug therapy 10/06/2020   Allergy, unspecified, initial encounter 08/21/2020   Anaphylactic shock, unspecified, initial encounter 08/21/2020   Nausea 05/18/2020   Acute respiratory disease due to COVID-19 virus 12/25/2019   Encounter for care of pacemaker 09/07/2019   Mild protein-calorie malnutrition ([place]) 07/01/2019   Coagulation defect, unspecified ([place]) 06/29/2019   Secondary hyperparathyroidism of renal origin ([place]) 06/29/2019   ESRD on dialysis ([place])    Pain in both lower extremities    Hypomagnesemia 06/11/2019   DNR (do not resuscitate) discussion    Cystitis    Acute on chronic diastolic CHF (congestive heart failure) ([place]) 06/10/2019   Chronic diastolic CHF (congestive heart failure) ([place]) 06/09/2019   Cellulitis of lower extremity 06/09/2019   Bladder pain    Hyponatremia    Acute on chronic anemia    Diabetes mellitus type 2 in obese (HCC)    Chronic kidney disease    Drug induced constipation    Neurogenic bladder    Weakness generalized 04/21/2019   Acquired hydronephrosis with ureteropelvic junction (UPJ) obstruction    Pain in joint involving pelvic region and thigh    Dysuria    Palliative care by specialist    AKI (acute kidney injury) ([place]) 04/10/2019   Hydronephrosis 04/09/2019   Interstitial cystitis 04/09/2019   Bilateral leg edema 02/25/2019   Chronic heart failure with preserved ejection fraction ([place]) 02/24/2019   Pacemaker Medtronic Azure XT DR MRI Dual Chamber Pacemaker 03/25/2018 02/24/2019   ARF (acute renal failure) ([place]) 02/10/2019   Peripheral edema    Fever 01/17/2019   Acute lower UTI 01/17/2019   Pressure injury of skin 01/17/2019   Gross hematuria 01/14/2019   Fluid retention    Generalized edema    Hypokalemia    Acute right-sided low back pain with right-sided sciatica    Acute blood loss anemia    Anemia of chronic disease     Thrombocytopenia (HCC)    Diabetes mellitus (HCC)    Morbid obesity (HCC)    Hypoalbuminemia due to protein-calorie malnutrition (HCC)    Occult blood in stools    Gastric polyp    Lumbar discitis 05/17/2018   Blood loss anemia 05/12/2018   Hyperlipidemia associated with type 2 diabetes mellitus ([place]) 05/10/2018   Yeast UTI 04/30/2018   Leukocytosis 04/29/2018   Chronic kidney disease (CKD), stage IV (severe) (HCC)    Diet-controlled diabetes mellitus (HCC)    GERD (gastroesophageal reflux disease)    Gout    High cholesterol    Hypothyroidism    Iron deficiency anemia    AV block, [place] 2 03/25/2018   Mobitz type 2 second degree AV block 03/20/2018   Exertional dyspnea 03/19/2018   Acute pain of left knee 07/16/2016   Right wrist pain 07/16/2016   PCP:  [name], MD Pharmacy:  No Pharmacies Listed    Social Determinants of Health (SDOH) Interventions    Readmission Risk Interventions    08/13/2021    2:56 PM 12/29/2019    9:29 AM  Readmission Risk Prevention Plan  Transportation Screening Complete Complete  Medication Review (RN Care Manager) Complete Complete  PCP or Specialist appointment within 3-5 days of discharge Complete   HRI or Home Care  Consult Complete Complete  SW Recovery Care/Counseling Consult Complete   Palliative Care Screening Not Applicable Not Applicable  Skilled Nursing Facility Patient Refused Not Applicable

## 2022-08-02 NOTE — Progress Notes (Signed)
[facility] Progress Note   Subjective:   Refused AM labs, says she was half asleep and didn't mean to refuse. Having pain in R lower back today and also mild TTP in RLQ. Discussed possible transition to HD again, she still wants to continue PD.   Objective Vitals:   07/29/22 0921 07/29/22 1648 07/29/22 2116 08-02-2022 0530  BP: 97/60 99/62 (!) 81/49 (!) 82/54  Pulse: 70 70 73 75  Resp: '17 18 18 18  '$ Temp: 98.4 F (36.9 C) 98.6 F (37 C) 98.2 F (36.8 C) (!) 97.5 F (36.4 C)  TempSrc: Oral  Oral Oral  SpO2: 100% 97% 98% 98%  Weight:      Height:       Physical Exam General: Alert female in NAD Heart: RRR, no murmurs, rubs or gallops Lungs: CTA bilaterally, respirations unlabored on RA Abdomen: Soft, non-distended, mild TTP RLQ +BS Extremities: Trace nonpitting edema bilateral lower extremities Dialysis Access: PD cath in lower abdomen, no surrounding erythema/drainage. + Bruit RUE AVF  Additional Objective Labs: Basic Metabolic Panel: Recent Labs  Lab 07/27/22 0450 07/28/22 0231 07/29/22 0726  NA 128* 128* 131*  K 3.6 3.3* 3.0*  CL 90* 89* 91*  CO2 21* 24 25  GLUCOSE 86 119* 135*  BUN 40* 43* 39*  CREATININE 6.79* 6.88* 6.59*  CALCIUM 9.6 9.8 10.0  PHOS 5.2* 4.2 4.4   Liver Function Tests: Recent Labs  Lab 07/27/22 0450 07/28/22 0231 07/29/22 0726  ALBUMIN 1.8* 1.9* 2.0*   No results for input(s): "LIPASE", "AMYLASE" in the last 168 hours. CBC: Recent Labs  Lab 07/25/22 0447 07/26/22 0613 07/27/22 0450 07/28/22 0231 07/29/22 0726  WBC 23.0* 27.1* 21.0* 16.3* 17.7*  NEUTROABS  --   --  15.3* 11.6* 13.0*  HGB 11.9* 12.7 10.1* 10.4* 10.9*  HCT 36.6 39.5 31.0* 31.4* 33.3*  MCV 97.1 98.5 96.9 95.4 97.1  PLT 275 270 332 250 280   Blood Culture    Component Value Date/Time   SDES FLUID PERITONEAL 07/24/2022 1549   SDES FLUID PERITONEAL 07/24/2022 1549   SPECREQUEST BOTTLES DRAWN AEROBIC AND ANAEROBIC DIALYSATE 07/24/2022 1549    SPECREQUEST DIALYSIS 07/24/2022 1549   CULT STAPHYLOCOCCUS HOMINIS (A) 07/24/2022 1549   REPTSTATUS 07/27/2022 FINAL 07/24/2022 1549   REPTSTATUS 07/24/2022 FINAL 07/24/2022 1549    Cardiac Enzymes: No results for input(s): "CKTOTAL", "CKMB", "CKMBINDEX", "TROPONINI" in the last 168 hours. CBG: Recent Labs  Lab 07/28/22 2214 07/29/22 0724 07/29/22 1127 07/29/22 1648 2022/08/02 0741  GLUCAP 160* 125* 134* 128* 142*   Iron Studies: No results for input(s): "IRON", "TIBC", "TRANSFERRIN", "FERRITIN" in the last 72 hours. '@lablastinr3'$ @ Studies/Results: No results found. Medications:  sodium chloride Stopped (07/14/22 0109)   sodium chloride Stopped (07/19/22 0400)   dialysis solution 1.5% low-MG/low-CA      cyanocobalamin  1,000 mcg Oral Daily   DULoxetine  20 mg Oral Daily   ezetimibe  10 mg Oral Daily   ferric citrate  420 mg Oral TID WC   gentamicin cream  1 Application Topical Daily   heparin injection (subcutaneous)  5,000 Units Subcutaneous Q8H   heparin sodium (porcine) 3,000 Units in dialysis solution 1.5% low-MG/low-CA 6,000 mL dialysis solution   Peritoneal Dialysis Q24H   insulin aspart  0-6 Units Subcutaneous TID WC   lactulose  20 g Oral Daily   levothyroxine  75 mcg Oral QAC breakfast   midodrine  10 mg Oral TID with meals   pantoprazole  40 mg Oral Daily  polyethylene glycol  17 g Oral BID   pregabalin  25 mg Oral QHS   simethicone  80 mg Oral QID   simvastatin  40 mg Oral QHS   vancomycin variable dose per unstable renal function (pharmacist dosing)   Does not apply See admin instructions    Outpatient Dialysis Orders: CCPD. Follows at [facility]. 7d/ wk, 75kg, 6 exchanges overnight, 2800cc , 1.5h dwell, no daybag - last Hb 12.9 on 8/2, last mircera 6/26 - last pth 1027, phos 9 - no esa or vdra  Assessment/Plan: Hypotension sepsis, generalized weakness(noted admit S IRS criteria on admission with leukocytosis and hypotension): now off pressors.  BP soft on  midodrine 10 mg 3 times daily, asymptomatic, noted EF 60 to 65% on TEE 2. Staphylococcus hominis peritonitis leukocytosis: now afebrile.  WBC improving. TEE negative. On vancomycin, if has increasing abdominal pain , despite antibiotics may need to have PD catheter removed however patient wants to treat with antibiotics for now.  3. ESRD: had complications  with PD  catheter requiring Cathflow instilled overnight 8/25 with noted PD cath working past 24 hours and using  heparin each bag.  If unable to do cycler as discussed this week discussed with patient will need to change to HD use her AV fistula . Discussed that she may also feel better on HD. As above she wants to say PD catheter at all possible.  Tonight CCPD use 1.5% dextrose. Patient will change to HD if needed for outpatient rehab but prefers PD hopefully CIR will accept.   4. HTN/volume: does not appear to be volume overloaded, however noted sodium 128, using 1.5% PD bags (however had not had good PD exchanges end of lat week secondary to PD complication). BP soft, continue midodrine 5. Hypokalemia: last K+ 3.0, may need PO supplementation, awaiting AM lab results 6. Anemia: Hgb 10.4  no ESA needs 7.Secondary hyperparathyroidism -calcium corrected over 10, phosphorus 4.2, using [place], PTH was 84= 07/24/22 8. Hypomagnesia: Magnesium improved to 2.2. 9 debility/ weakness: admit team did consult CIR.     Anice Paganini, PA-C 08-10-22, 8:55 AM  [facility] Pager: [phone]

## 2022-08-02 NOTE — Progress Notes (Addendum)
I was walking down the hall with TG, RN to give handoff report, when CCMD Meka called the charge phone at [place] to tell  me to check on 5M09 pt looks like asystole. I said ok hung up the phone ran to the pt room, she was not breathing and there was no pulse. I began chest compressions and told TG, RN to pull code blue button. Floor staff came in and assisted until code team arrived.

## 2022-08-02 NOTE — Death Summary Note (Signed)
DEATH SUMMARY   Patient Details  Name: Erika Fry MRN: [id number] DOB: 04/30/1946 JAS:NKNLSteva Ready, MD Admission/Discharge Information   Admit Date:  2022-08-03  Date of Death: Date of Death: 2022-08-20  Time of Death: Time of Death: 03-Feb-1945  Length of Stay: 02/08/23   Principle Cause of death: ventricular tachycardia   Hospital Diagnoses: Principal Problem:   Septic shock ([place]) Active Problems:   GERD (gastroesophageal reflux disease)   Hypothyroidism   Leukocytosis   Hyperlipidemia associated with type 2 diabetes mellitus ([place])   Anemia due to chronic kidney disease   Hypokalemia   Pacemaker Medtronic Azure XT DR MRI Dual Chamber Pacemaker 03/25/2018   Chronic diastolic CHF (congestive heart failure) (HCC)   Hypomagnesemia   ESRD on dialysis ([place])   Hypotension   Vaginal pain   Moderate aortic stenosis   Ileus (HCC)   Peritonitis, dialysis-associated ([place])   Peripheral neuropathy  Hospital Course: 76 year old female with pertinent PMH of ESRD on peritoneal dialysis, DMT2, anemia, thrombocytopenia, HFpEF, pacemaker in place presents to [facility] ED on Aug 04, 2023 with generalized weakness. Found to have shock possibly septic shock.  There is also concern for peritonitis.  Treated with IV antibiotics.  Now appears to have developed ileus along with peritonitis from peritoneal catheter as of 8/23.  Was on IV vancomycin. CIR refused for the patient,  Patient agreeable to transition to SNF and transition to HD for that. On 2023/08/21 Patient developed a run of sustained ventricular tachycardia at 1912/02/04 followed asystole noted by CCMD, prompting to [place] alert at 1916/02/04 with chest compressions being initiated promptly by the bedside nurse and the code team.  Despite ventricular tachycardia being identified prior to the patient losing her pulse patient remained in PEA arrest throughout the entire resuscitative effort. CPR efforts lasted 25 minutes. But the pt did not regained ROSC. 8 doses of  epi, 2 doses sodium bicarb, 1 dose of potassium. Patient unfortunately was never able to regain her pulse with resuscitative efforts ceasing and time of death being called at [place].  Assessment and Plan: Septic shock, present on admission presented with fatigue, generalized weakness, met SIRS criteria on admission with leukocytosis and hypotension. Treated with IV cefepime and vancomycin on admission and later on with cefepime alone as well as fluconazole. Received IV Levophed at the time of admission and currently on midodrine. TTE was concerning for possible endocarditis.  TEE was negative for endocarditis Blood cultures negative. Was on midodrine.   Staph hominis peritonitis. Due to abdominal pain and worsening leukocytosis on 8/23 peritoneal fluid was sent again for culture. Peritoneal fluid culture growing gram-positive cocci. Given that the patient has abdominal pain, ileus as well as leukocytosis patient was started on IV vancomycin. WBC appeared  to be improving on Antibiotics..   Ileus. Likely due to peritonitis.  Treated with scheduled Reglan.  Now stopped. Having bowel movement.   Hypomagnesemia/hypokalemia/hyponatremia Currently undergoing peritoneal dialysis per nephrology. Electrolyte management per nephrology.  40 of potassium given on 2023/08/21.   Moderate AAS/mild to moderate AI/moderate MR s/p Medtronic dual chamber pacemaker for symptomatic high grade AV block, Outpatient follow-up with cardiology.   Vaginal/abdominal pain Chronic peripheral neuropathy of lower extremities Chronic pain with acute worsening.  Pt was on oral opioids  Added Cymbalta and Lyrica   Chronic anemia secondary to ESRD Patient likely on presentation with hemoconcentration. Hemoglobin currently stable -Per nephrology.   ESRD on CCPD Was on nightly PD. -Per nephrology. -would have needed HD if needs to go to  SNF.  Patient verbalized that she wants to go to SNF, nephrology aware and plan is  to switch to HD as on 8/29.   Endocarditis ruled out 2D echo done on presentation with concern for thrombus on pacemaker lead and ascending aorta. Patient seen in consultation by cardiology, underwent TEE which was negative for vegetations.   Bilateral chronic venous stasis -Lower extremity Dopplers negative for DVT.   Hypothyroidism -Synthroid.    Vaginal candidiasis -Status post Diflucan 150 mg p.o. x1. -Was on clotrimazole vaginal cream daily nightly.   Constipation Resolved with MiraLAX. simethicone, Treated with scheduled Reglan.  Procedures: TEE  Consultations: prmary admission with [facility]  Cardiology  Nephrology   The results of significant diagnostics from this hospitalization (including imaging, microbiology, ancillary and laboratory) are listed below for reference.   Significant Diagnostic Studies: DG Abd Portable 1V  Result Date: 07/27/2022 CLINICAL DATA:  Ileus EXAM: PORTABLE ABDOMEN - 1 VIEW COMPARISON:  07/24/2022 FINDINGS: Nonobstructive bowel gas pattern with air-filled loops of large and small bowel within the abdomen, decreasing in caliber from the previous CT. No gross free intraperitoneal air. Peritoneal dialysis catheter within the low pelvis. IMPRESSION: Bowel gas pattern favoring ileus, which appears improving from prior. Electronically Signed   By: Davina Poke D.O.   On: 07/27/2022 09:27   CT ABDOMEN PELVIS WO CONTRAST  Result Date: 07/24/2022 CLINICAL DATA:  Nausea and vomiting. Suspected bowel obstruction and peritonitis. On peritoneal dialysis. EXAM: CT ABDOMEN AND PELVIS WITHOUT CONTRAST TECHNIQUE: Multidetector CT imaging of the abdomen and pelvis was performed following the standard protocol without IV contrast. RADIATION DOSE REDUCTION: This exam was performed according to the departmental dose-optimization program which includes automated exposure control, adjustment of the mA and/or kV according to patient size and/or use of iterative  reconstruction technique. COMPARISON:  07/27/2022 FINDINGS: Lower chest: 2.2 x 1.6 cm pulmonary nodule seen in the posterior lingula, suspicious for neoplasm. Tiny bilateral pleural effusions also noted. Hepatobiliary: No mass visualized on this unenhanced exam. Prior cholecystectomy. No evidence of biliary obstruction. Pancreas: No mass or inflammatory process visualized on this unenhanced exam. Spleen:  Within normal limits in size. Adrenals/Urinary tract: Bilateral renal parenchymal atrophy again demonstrated, consistent chronic renal failure. Multiple small less than 1 cm renal calculi are again seen bilaterally. Stable right renal pelvicaliectasis without evidence of ureteral calculi or dilatation. 1 cm fat attenuation lesion in upper pole of left kidney is stable, consistent with benign angiomyolipoma (no followup imaging recommended). Unremarkable unopacified urinary bladder. Stomach/Bowel: Mild diffuse colonic and small bowel dilatation is seen without transition point, consistent with mild ileus. No evidence of bowel wall thickening or focal inflammatory process. Vascular/Lymphatic: No pathologically enlarged lymph nodes identified. No evidence of abdominal aortic aneurysm. Aortic atherosclerotic calcification incidentally noted. Reproductive:  No mass or other significant abnormality. Other: Peritoneal dialysis catheter is seen within the pelvis, with mild moderate ascites and tiny amount of free intraperitoneal air noted. Musculoskeletal:  No suspicious bone lesions identified. IMPRESSION: Mild diffuse colonic and small bowel dilatation, consistent with ileus. No evidence of bowel obstruction or focal inflammatory process. Peritoneal dialysis catheter, with mild-to-moderate ascites and tiny amount of free intraperitoneal air. Bilateral nephrolithiasis. Stable right renal pelvicaliectasis without evidence of ureteral calculi or dilatation. Stable small benign left renal angiomyolipoma. 2.2 cm pulmonary  nodule in posterior lingula, suspicious for neoplasm. Chest CT is recommended for further evaluation. Electronically Signed   By: Marlaine Hind M.D.   On: 07/24/2022 15:08   DG Chest 2 View  Result  Date: 07/23/2022 CLINICAL DATA:  Leukocytosis. EXAM: CHEST - 2 VIEW COMPARISON:  July 13, 2022. FINDINGS: Stable cardiomediastinal silhouette. Left-sided pacemaker is unchanged in position. Small left pleural effusion is noted with associated left basilar atelectasis or infiltrate. Right lung is unremarkable. Bony thorax is unremarkable. IMPRESSION: Small left pleural effusion is noted with associated left basilar atelectasis or infiltrate. Electronically Signed   By: Marijo Conception M.D.   On: 07/23/2022 15:25   ECHO TEE  Result Date: 07/15/2022    TRANSESOPHOGEAL ECHO REPORT   Patient Name:   Erika Fry Date of Exam: 07/18/2022 Medical Rec #:  [id number]          Height:       65.0 in Accession #:    [id number]         Weight:       176.8 lb Date of Birth:  1946/11/26          BSA:          1.877 m Patient Age:    57 years           BP:           85/43 mmHg Patient Gender: F                  HR:           61 bpm. Exam Location:  Inpatient Procedure: 3D Echo, Transesophageal Echo, Cardiac Doppler and Color Doppler Indications:     I35.2 Nonrheumatic aortic (valve) stenosis with insufficiency;                  I35.8 Other nonrheumatic aortic valve disorders  History:         Patient has prior history of Echocardiogram examinations, most                  recent 07/15/2022. Pacemaker, Aortic Valve Disease and Mitral                  Valve Disease, Signs/Symptoms:Bacteremia, Fever and Edema; Risk                  Factors:Diabetes. Shock. Aortic stenosis.  Sonographer:     Roseanna Rainbow RDCS Referring Phys:  Reynold Bowen PATWARDHAN Diagnosing Phys: Adrian Prows MD PROCEDURE: After discussion of the risks and benefits of a TEE, an informed consent was obtained from the patient. The transesophogeal probe was passed without  difficulty through the esophogus of the patient. Imaged were obtained with the patient in a left lateral decubitus position. Sedation performed by different physician. The patient was monitored while under deep sedation. Anesthestetic sedation was provided intravenously by Anesthesiology: 140m of Propofol. The patient's vital signs; including heart rate, blood pressure, and oxygen saturation; remained stable throughout the procedure. The patient developed no complications during the procedure.  IMPRESSIONS  1. Left ventricular ejection fraction, by estimation, is 60 to 65%. The left ventricle has normal function. The left ventricle has no regional wall motion abnormalities.  2. Right ventricular systolic function is normal. The right ventricular size is normal.  3. Left atrial size was moderately dilated. No left atrial/left atrial appendage thrombus was detected.  4. Pacemaker lead noted.  5. The mass is separate from the AV and appears to have similar echogenicity of the pacemaker wire? complex atheroma measuring 2.1x0.7 cm?.[name] KitchenThe pacemaker leads appear to be intact. . There is mild dilatation of the ascending aorta, measuring 37 mm. There is Severe, mobile (Grade  V) protruding plaque involving the ascending aorta.  6. The aortic valve is calcified. There is severe calcifcation of the aortic valve. There is moderate thickening of the aortic valve. Aortic valve regurgitation is moderate. Moderate aortic valve stenosis. Aortic valve area, by VTI measures 0.83 cm. Aortic valve mean gradient measures 41.0 mmHg. Aortic valve Vmax measures 4.39 m/s.  7. The mitral valve is degenerative. Moderate mitral valve regurgitation. No evidence of mitral stenosis. Moderate mitral annular calcification.  8. Conclusion(s)/Recommendation(s): Procedure done in a relative short time due to complexity of the patient's physical condition. No TEE e/o Endocarditis. FINDINGS  Left Ventricle: Left ventricular ejection fraction, by  estimation, is 60 to 65%. The left ventricle has normal function. The left ventricle has no regional wall motion abnormalities. The left ventricular internal cavity size was normal in size. There is  no left ventricular hypertrophy. Right Ventricle: The right ventricular size is normal. No increase in right ventricular wall thickness. Right ventricular systolic function is normal. Left Atrium: Left atrial size was moderately dilated. No left atrial/left atrial appendage thrombus was detected. Right Atrium: Right atrial size was normal in size. Pacemaker lead noted. Pericardium: There is no evidence of pericardial effusion. Mitral Valve: The mitral valve is degenerative in appearance. There is moderate thickening of the mitral valve leaflet(s). Mildly decreased mobility of the mitral valve leaflets. Moderate mitral annular calcification. Moderate mitral valve regurgitation.  No evidence of mitral valve stenosis. Tricuspid Valve: The tricuspid valve is normal in structure. Tricuspid valve regurgitation is trivial. Aortic Valve: The aortic valve is calcified. There is severe calcifcation of the aortic valve. There is moderate thickening of the aortic valve. There is mild aortic valve annular calcification. Aortic valve regurgitation is moderate. Aortic regurgitation PHT measures 527 msec. Moderate aortic stenosis is present. Aortic valve mean gradient measures 41.0 mmHg. Aortic valve peak gradient measures 77.1 mmHg. Aortic valve area, by VTI measures 0.83 cm. Pulmonic Valve: The pulmonic valve was normal in structure. Pulmonic valve regurgitation is not visualized. No evidence of pulmonic stenosis. Aorta: The mass is separate from the AV and appears to have similar echogenicity of the pacemaker wire? complex atheroma measuring 2.1x0.7 cm?[name] Kitchen The pacemaker leads appear to be intact. The aortic root is normal in size and structure. There is mild dilatation of the ascending aorta, measuring 37 mm. There is severe, mobile  (Grade V) protruding plaque involving the ascending aorta. Venous: The inferior vena cava was not well visualized. IAS/Shunts: No atrial level shunt detected by color flow Doppler. Additional Comments: A device lead is visualized.  LEFT VENTRICLE PLAX 2D LVOT diam:     2.07 cm LV SV:         85 LV SV Index:   45 LVOT Area:     3.35 cm  AORTIC VALVE AV Area (Vmax):    0.85 cm AV Area (Vmean):   0.87 cm AV Area (VTI):     0.83 cm AV Vmax:           439.00 cm/s AV Vmean:          293.333 cm/s AV VTI:            1.020 m AV Peak Grad:      77.1 mmHg AV Mean Grad:      41.0 mmHg LVOT Vmax:         111.00 cm/s LVOT Vmean:        75.850 cm/s LVOT VTI:          0.252  m LVOT/AV VTI ratio: 0.25 AI PHT:            527 msec MR Peak grad:    101.6 mmHg MR Mean grad:    64.0 mmHg    SHUNTS MR Vmax:         504.00 cm/s  Systemic VTI:  0.25 m MR Vmean:        369.0 cm/s   Systemic Diam: 2.07 cm MR PISA:         1.01 cm MR PISA Eff ROA: 8 mm MR PISA Radius:  0.40 cm Adrian Prows MD Electronically signed by Adrian Prows MD Signature Date/Time: 07/28/2022/9:03:47 PM    Final    VAS [place] LOWER EXTREMITY VENOUS (DVT)  Result Date: 07/15/2022  Lower Venous DVT Study Patient Name:  DERRIAN RODAK  Date of Exam:   07/15/2022 Medical Rec #: [id number]           Accession #:    [id number] Date of Birth: Jul 11, 1946           Patient Gender: F Patient Age:   20 years Exam Location:  [facility] [place] Procedure:      VAS [place] LOWER EXTREMITY VENOUS (DVT) Referring Phys: Jenny Reichmann PAYNE --------------------------------------------------------------------------------  Indications: Swelling.  Risk Factors: Limitations: Poor ultrasound/tissue interface and patient pain tolerance. Comparison Study: No prior studies. Performing Technologist: Oliver Hum RVT  Examination Guidelines: A complete evaluation includes B-mode imaging, spectral Doppler, color Doppler, and power Doppler as needed of all accessible portions of each vessel. Bilateral testing is  considered an integral part of a complete examination. Limited examinations for reoccurring indications may be performed as noted. The reflux portion of the exam is performed with the patient in reverse Trendelenburg.  +---------+---------------+---------+-----------+----------+-------------------+ RIGHT    CompressibilityPhasicitySpontaneityPropertiesThrombus Aging      +---------+---------------+---------+-----------+----------+-------------------+ CFV      Full           Yes      Yes                                      +---------+---------------+---------+-----------+----------+-------------------+ SFJ      Full                                                             +---------+---------------+---------+-----------+----------+-------------------+ FV Prox  Full                                                             +---------+---------------+---------+-----------+----------+-------------------+ FV Mid   Full                                                             +---------+---------------+---------+-----------+----------+-------------------+ FV DistalFull                                                             +---------+---------------+---------+-----------+----------+-------------------+  PFV      Full                                                             +---------+---------------+---------+-----------+----------+-------------------+ POP      Full           Yes      Yes                                      +---------+---------------+---------+-----------+----------+-------------------+ PTV      Full                                                             +---------+---------------+---------+-----------+----------+-------------------+ PERO                                                  Patency shown with                                                        color doppler        +---------+---------------+---------+-----------+----------+-------------------+   +---------+---------------+---------+-----------+----------+--------------+ LEFT     CompressibilityPhasicitySpontaneityPropertiesThrombus Aging +---------+---------------+---------+-----------+----------+--------------+ CFV      Full           Yes      Yes                                 +---------+---------------+---------+-----------+----------+--------------+ SFJ      Full                                                        +---------+---------------+---------+-----------+----------+--------------+ FV Prox  Full                                                        +---------+---------------+---------+-----------+----------+--------------+ FV Mid   Full                                                        +---------+---------------+---------+-----------+----------+--------------+ FV DistalFull                                                        +---------+---------------+---------+-----------+----------+--------------+  PFV      Full                                                        +---------+---------------+---------+-----------+----------+--------------+ POP      Full           Yes      Yes                                 +---------+---------------+---------+-----------+----------+--------------+ PTV      Full                                                        +---------+---------------+---------+-----------+----------+--------------+ PERO     Full                                                        +---------+---------------+---------+-----------+----------+--------------+     Summary: RIGHT: - There is no evidence of deep vein thrombosis in the lower extremity. However, portions of this examination were limited- see technologist comments above.  - No cystic structure found in the popliteal fossa.  LEFT: - There is no evidence of deep  vein thrombosis in the lower extremity. However, portions of this examination were limited- see technologist comments above.  - No cystic structure found in the popliteal fossa.  *See table(s) above for measurements and observations. Electronically signed by Servando Snare MD on 07/15/2022 at 5:41:15 PM.    Final    ECHOCARDIOGRAM COMPLETE  Result Date: 07/15/2022    ECHOCARDIOGRAM REPORT   Patient Name:   Erika Fry Date of Exam: 07/15/2022 Medical Rec #:  [id number]          Height:       65.0 in Accession #:    [id number]         Weight:       176.8 lb Date of Birth:  11/05/1946          BSA:          1.877 m Patient Age:    12 years           BP:           93/54 mmHg Patient Gender: F                  HR:           60 bpm. Exam Location:  Inpatient Procedure: 2D Echo, Color Doppler, Cardiac Doppler and 3D Echo Indications:    Septic Shock  History:        Patient has prior history of Echocardiogram examinations, most                 recent 08/13/2021. CHF; Risk Factors:Diabetes and Dyslipidemia.  Sonographer:    Raquel Sarna Senior RDCS Referring Phys: [phone] [facility] IMPRESSIONS  1. Left ventricular ejection fraction, by estimation, is 60 to 65%. The left ventricle has normal function.  The left ventricle has no regional wall motion abnormalities. There is severe asymmetric left ventricular hypertrophy of the basal-septal segment. Left ventricular diastolic parameters are consistent with Grade I diastolic dysfunction (impaired relaxation). Elevated left ventricular end-diastolic pressure.  2. There appears to be possible mobile vegeation or thrombus associated with the pacemaker lead. Right ventricular systolic function is low normal. The right ventricular size is normal.  3. Left atrial size was moderately dilated.  4. Large calcified mass on the posterior leafet. The mitral valve is abnormal. Trivial mitral valve regurgitation. Mild mitral stenosis. The mean mitral valve gradient is 5.0 mmHg with  average heart rate of 60 bpm. Moderate mitral annular calcification.  5. The aortic valve is abnormal. There is moderate calcification of the aortic valve. There is moderate thickening of the aortic valve. Aortic valve regurgitation is mild to moderate. Moderate to severe aortic valve stenosis based on gradients, however,  more visually moderate - while there is severe subvalvular septal hypertrophy, the LVOT gradient is not significantly elevated at rest suggestive of HOCM- valsalva was not performed  6. Aortic valve area, by VTI measures 0.86 cm. Aortic valve mean gradient measures 43.5 mmHg. Aortic valve Vmax measures 4.20 m/s. DI is 0.27, consistent with moderate to severe AS.  7. Large mobile thrombus (or possible thrombotic vegetation) in the proximal aorta measuring 1.69 x .38 cm. There is Severe, mobile (Grade V) protruding plaque involving the ascending aorta. Comparison(s): Changes from prior study are noted. 08/13/2021: LVEF >75%, severe biatrial enlargment, mild to moderate AS. Conclusion(s)/Recommendation(s): Findings concerning for thrombus/vegetation noted associated with the pacemaker lead, however, there is also a mobile mass in the proximal ascending aorta, would recommend Transesophageal Echocardiogram for clarification.  Critical findings reported to Dr. Carlis Abbott and acknowledged at 2:48 pm. FINDINGS  Left Ventricle: Left ventricular ejection fraction, by estimation, is 60 to 65%. The left ventricle has normal function. The left ventricle has no regional wall motion abnormalities. The left ventricular internal cavity size was normal in size. There is  severe asymmetric left ventricular hypertrophy of the basal-septal segment. Left ventricular diastolic parameters are consistent with Grade I diastolic dysfunction (impaired relaxation). Elevated left ventricular end-diastolic pressure. Right Ventricle: There appears to be possible mobile vegeation or thrombus associated with the pacemaker lead. The  right ventricular size is normal. No increase in right ventricular wall thickness. Right ventricular systolic function is low normal. Left Atrium: Left atrial size was moderately dilated. Right Atrium: Right atrial size was not well visualized. Pericardium: There is no evidence of pericardial effusion. Mitral Valve: Large calcified mass on the posterior leafet. The mitral valve is abnormal. Moderate mitral annular calcification. Trivial mitral valve regurgitation. Mild mitral valve stenosis. MV peak gradient, 9.5 mmHg. The mean mitral valve gradient is  5.0 mmHg with average heart rate of 60 bpm. Tricuspid Valve: The tricuspid valve is grossly normal. Tricuspid valve regurgitation is mild. Aortic Valve: The aortic valve is abnormal. There is moderate calcification of the aortic valve. There is moderate thickening of the aortic valve. Aortic valve regurgitation is mild to moderate. Moderate to severe aortic stenosis is present. Aortic valve  mean gradient measures 43.5 mmHg. Aortic valve peak gradient measures 70.6 mmHg. Aortic valve area, by VTI measures 0.86 cm. Pulmonic Valve: The pulmonic valve was not well visualized. Pulmonic valve regurgitation is not visualized. Aorta: Large mobile thrombus (or possible thrombotic vegetation) in the proximal aorta measuring 1.69 x .38 cm. The aortic root and ascending aorta are structurally normal, with no evidence of  dilitation. There is severe, mobile (Grade V) protruding plaque involving the ascending aorta. Venous: The inferior vena cava was not well visualized. IAS/Shunts: The interatrial septum was not well visualized. Additional Comments: A device lead is visualized.  LEFT VENTRICLE PLAX 2D LVIDd:         4.20 cm   Diastology LVIDs:         2.80 cm   LV e' medial:    3.81 cm/s LV PW:         1.10 cm   LV E/e' medial:  39.9 LV IVS:        1.40 cm   LV e' lateral:   5.11 cm/s LVOT diam:     2.00 cm   LV E/e' lateral: 29.7 LV SV:         86 LV SV Index:   46 LVOT Area:      3.14 cm  RIGHT VENTRICLE RV S prime:     10.70 cm/s TAPSE (M-mode): 2.1 cm LEFT ATRIUM             Index        RIGHT ATRIUM           Index LA diam:        4.00 cm 2.13 cm/m   RA Area:     13.80 cm LA Vol (A2C):   98.2 ml 52.31 ml/m  RA Volume:   31.30 ml  16.67 ml/m LA Vol (A4C):   53.9 ml 28.71 ml/m LA Biplane Vol: 75.9 ml 40.43 ml/m  AORTIC VALVE AV Area (Vmax):    0.87 cm AV Area (Vmean):   0.83 cm AV Area (VTI):     0.86 cm AV Vmax:           420.00 cm/s AV Vmean:          310.500 cm/s AV VTI:            0.999 m AV Peak Grad:      70.6 mmHg AV Mean Grad:      43.5 mmHg LVOT Vmax:         116.00 cm/s LVOT Vmean:        82.200 cm/s LVOT VTI:          0.274 m LVOT/AV VTI ratio: 0.27  AORTA Ao Root diam: 3.10 cm MITRAL VALVE                TRICUSPID VALVE MV Area (PHT): 2.23 cm     TR Peak grad:   13.2 mmHg MV Area VTI:   1.53 cm     TR Vmax:        182.00 cm/s MV Peak grad:  9.5 mmHg MV Mean grad:  5.0 mmHg     SHUNTS MV Vmax:       1.54 m/s     Systemic VTI:  0.27 m MV Vmean:      105.0 cm/s   Systemic Diam: 2.00 cm MV Decel Time: 340 msec MV E velocity: 152.00 cm/s MV A velocity: 145.00 cm/s MV E/A ratio:  1.05 Lyman Bishop MD Electronically signed by Lyman Bishop MD Signature Date/Time: 07/15/2022/2:53:07 PM    Final    DG Chest Portable 1 View  Result Date: 07/29/2022 CLINICAL DATA:  Fever and weakness EXAM: PORTABLE CHEST 1 VIEW COMPARISON:  Radiographs 08/06/2021 FINDINGS: No focal consolidation, pleural effusion, or pneumothorax. Cardiomegaly. Prominent fat pad at the cardiac apex. Aortic calcification. Left chest wall pacemaker. No acute osseous abnormality. No  significant change from radiographs 08/06/2021. IMPRESSION: No acute findings in the chest.  Cardiomegaly. Electronically Signed   By: Placido Sou M.D.   On: 07/09/2022 18:47   CT ABDOMEN PELVIS W CONTRAST  Result Date: 07/02/2022 CLINICAL DATA:  Left lower quadrant abdominal pain, on peritoneal dialysis EXAM: CT ABDOMEN  AND PELVIS WITH CONTRAST TECHNIQUE: Multidetector CT imaging of the abdomen and pelvis was performed using the standard protocol following bolus administration of intravenous contrast. RADIATION DOSE REDUCTION: This exam was performed according to the departmental dose-optimization program which includes automated exposure control, adjustment of the mA and/or kV according to patient size and/or use of iterative reconstruction technique. CONTRAST:  172m OMNIPAQUE IOHEXOL 300 MG/ML  SOLN COMPARISON:  CT 04/22/2022 FINDINGS: Lower chest: Bibasilar atelectasis. Mitral annular coronary artery atherosclerotic calcification. Hepatobiliary: No focal liver abnormality is seen. Status post cholecystectomy. No biliary dilatation. Pancreas: Unremarkable. No pancreatic ductal dilatation or surrounding inflammatory changes. Spleen: Normal in size without focal abnormality. Adrenals/Urinary Tract: Unremarkable adrenal glands. Bilateral renal atrophy. Multiple bilateral renal calculi measuring 7 mm in the lower pole of the right kidney. 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 Unchanged small renal cysts, too small to definitively characterize and not requiring follow-up. Decreased right hydronephrosis. No ureteral calculi. Bladder is decompressed Stomach/Bowel: Stomach is within normal limits. The appendix is not visualized. No evidence of bowel wall thickening, distention, or inflammatory changes. Vascular/Lymphatic: Aortic atherosclerosis. No enlarged abdominal or pelvic lymph nodes. Reproductive: Unremarkable. Other: Small volume perihepatic and pelvic ascites. The peritoneal dialysis catheter again terminates in the left ventral pelvis. The visualized portions of the catheter appear intact. Free intraperitoneal air is likely related to peritoneal dialysis. Musculoskeletal: Advanced degenerative disc disease at L4-L5. No acute osseous abnormality. IMPRESSION: 1. No acute abnormality in the abdomen or pelvis. 2.  Atrophic bilateral kidneys with nonobstructing nephrolithiasis. Chronic right hydronephrosis has decreased since prior. 3. Unchanged positioning of the peritoneal dialysis catheter with tip in the left lower quadrant. Electronically Signed   By: TPlacido SouM.D.   On: 07/18/2022 17:53    Microbiology: Recent Results (from the past 240 hour(s))  Culture, blood (Routine X 2) w Reflex to ID Panel     Status: None   Collection Time: 07/23/22 11:40 AM   Specimen: BLOOD LEFT HAND  Result Value Ref Range Status   Specimen Description BLOOD LEFT HAND  Final   Special Requests   Final    AEROBIC BOTTLE ONLY Blood Culture results may not be optimal due to an inadequate volume of blood received in culture bottles   Culture   Final    NO GROWTH 5 DAYS Performed at [facility] [address] [place] [address]   Report Status 07/28/2022 FINAL  Final  Culture, blood (Routine X 2) w Reflex to ID Panel     Status: None   Collection Time: 07/23/22 11:40 AM   Specimen: BLOOD LEFT HAND  Result Value Ref Range Status   Specimen Description BLOOD LEFT HAND  Final   Special Requests AEROBIC BOTTLE ONLY Blood Culture adequate volume  Final   Culture   Final    NO GROWTH 5 DAYS [facility] at [facility] [address], [place] [address]   Report Status 07/28/2022 FINAL  Final  Culture, body fluid w Gram Stain-bottle     Status: Abnormal   Collection Time: 07/24/22  3:49 PM   Specimen: Fluid  Result Value Ref Range Status  Specimen Description FLUID PERITONEAL  Final   Special Requests BOTTLES DRAWN AEROBIC AND ANAEROBIC DIALYSATE  Final   Gram Stain   Final    GRAM POSITIVE COCCI IN CLUSTERS ANAEROBIC BOTTLE ONLY CRITICAL RESULT CALLED TO, READ BACK BY AND VERIFIED WITH: PHARMD S DAVIS [id number] AT 1407 BY CM Performed at [facility], [place] [address] [place], [place] [address]    Culture STAPHYLOCOCCUS HOMINIS (A)  Final   Report Status 07/27/2022 FINAL   Final   Organism ID, Bacteria STAPHYLOCOCCUS HOMINIS  Final      Susceptibility   Staphylococcus hominis - MIC*    CIPROFLOXACIN >=8 RESISTANT Resistant     ERYTHROMYCIN >=8 RESISTANT Resistant     GENTAMICIN <=0.5 SENSITIVE Sensitive     OXACILLIN 1 RESISTANT Resistant     TETRACYCLINE >=16 RESISTANT Resistant     VANCOMYCIN 1 SENSITIVE Sensitive     TRIMETH/SULFA 160 RESISTANT Resistant     CLINDAMYCIN RESISTANT Resistant     RIFAMPIN <=0.5 SENSITIVE Sensitive     Inducible Clindamycin POSITIVE Resistant     * STAPHYLOCOCCUS HOMINIS  Gram stain     Status: None   Collection Time: 07/24/22  3:49 PM   Specimen: Fluid  Result Value Ref Range Status   Specimen Description FLUID PERITONEAL  Final   Special Requests DIALYSIS  Final   Gram Stain   Final    WBC PRESENT, PREDOMINANTLY PMN NO ORGANISMS SEEN CYTOSPIN SMEAR Performed at [facility], [place] [address], [place], [place] [address]    Report Status 07/24/2022 FINAL  Final    Time spent: 20 minutes  Signed: Berle Mull, MD

## 2022-08-02 NOTE — Plan of Care (Signed)
  Problem: Health Behavior/Discharge Planning: Goal: Ability to manage health-related needs will improve Outcome: Progressing   Problem: Activity: Goal: Risk for activity intolerance will decrease Outcome: Progressing   Problem: Pain Managment: Goal: General experience of comfort will improve Outcome: Progressing   Problem: Education: Goal: Knowledge of disease and its progression will improve Outcome: Progressing   Problem: Nutritional: Goal: Ability to make healthy dietary choices will improve Outcome: Progressing   Problem: Clinical Measurements: Goal: Complications related to the disease process, condition or treatment will be avoided or minimized Outcome: Progressing

## 2022-08-02 NOTE — Progress Notes (Signed)
Pt in bed stable at this time no c/os connected as prescribed drsg changed

## 2022-08-02 DEATH — deceased

## 2022-08-09 ENCOUNTER — Ambulatory Visit: Payer: Medicare Other | Admitting: Internal Medicine

## 2022-08-14 ENCOUNTER — Ambulatory Visit: Payer: Medicare Other | Admitting: Internal Medicine

## 2022-08-21 ENCOUNTER — Ambulatory Visit: Payer: Medicare Other | Admitting: Podiatry
